# Patient Record
Sex: Male | Born: 1950 | Race: Black or African American | Hispanic: No | Marital: Married | Smoking: Former smoker
Health system: Southern US, Community
[De-identification: ages and names within clinical notes are randomized; demographics above are authoritative.]

## PROBLEM LIST (undated history)

## (undated) DIAGNOSIS — I2510 Atherosclerotic heart disease of native coronary artery without angina pectoris: Secondary | ICD-10-CM

## (undated) DIAGNOSIS — N200 Calculus of kidney: Secondary | ICD-10-CM

## (undated) DIAGNOSIS — R0683 Snoring: Secondary | ICD-10-CM

## (undated) DIAGNOSIS — M5430 Sciatica, unspecified side: Secondary | ICD-10-CM

## (undated) DIAGNOSIS — Z993 Dependence on wheelchair: Secondary | ICD-10-CM

## (undated) DIAGNOSIS — K219 Gastro-esophageal reflux disease without esophagitis: Secondary | ICD-10-CM

## (undated) DIAGNOSIS — Z9989 Dependence on other enabling machines and devices: Secondary | ICD-10-CM

## (undated) DIAGNOSIS — R079 Chest pain, unspecified: Secondary | ICD-10-CM

## (undated) DIAGNOSIS — E785 Hyperlipidemia, unspecified: Secondary | ICD-10-CM

## (undated) DIAGNOSIS — M5126 Other intervertebral disc displacement, lumbar region: Secondary | ICD-10-CM

## (undated) DIAGNOSIS — G4730 Sleep apnea, unspecified: Secondary | ICD-10-CM

## (undated) DIAGNOSIS — G8929 Other chronic pain: Secondary | ICD-10-CM

## (undated) DIAGNOSIS — Z992 Dependence on renal dialysis: Secondary | ICD-10-CM

## (undated) DIAGNOSIS — N186 End stage renal disease: Secondary | ICD-10-CM

## (undated) DIAGNOSIS — R0600 Dyspnea, unspecified: Secondary | ICD-10-CM

## (undated) DIAGNOSIS — Z8719 Personal history of other diseases of the digestive system: Secondary | ICD-10-CM

## (undated) DIAGNOSIS — I10 Essential (primary) hypertension: Secondary | ICD-10-CM

## (undated) DIAGNOSIS — I050 Rheumatic mitral stenosis: Secondary | ICD-10-CM

## (undated) DIAGNOSIS — D638 Anemia in other chronic diseases classified elsewhere: Secondary | ICD-10-CM

## (undated) DIAGNOSIS — I6529 Occlusion and stenosis of unspecified carotid artery: Secondary | ICD-10-CM

## (undated) DIAGNOSIS — R350 Frequency of micturition: Secondary | ICD-10-CM

## (undated) DIAGNOSIS — E119 Type 2 diabetes mellitus without complications: Secondary | ICD-10-CM

## (undated) DIAGNOSIS — J189 Pneumonia, unspecified organism: Secondary | ICD-10-CM

## (undated) DIAGNOSIS — I739 Peripheral vascular disease, unspecified: Secondary | ICD-10-CM

## (undated) DIAGNOSIS — K5900 Constipation, unspecified: Secondary | ICD-10-CM

## (undated) DIAGNOSIS — M1990 Unspecified osteoarthritis, unspecified site: Secondary | ICD-10-CM

## (undated) DIAGNOSIS — G2581 Restless legs syndrome: Secondary | ICD-10-CM

## (undated) DIAGNOSIS — Z87442 Personal history of urinary calculi: Secondary | ICD-10-CM

## (undated) HISTORY — PX: COLONOSCOPY W/ BIOPSIES AND POLYPECTOMY: SHX1376

## (undated) HISTORY — PX: CARDIAC CATHETERIZATION: SHX172

## (undated) HISTORY — PX: FRACTURE SURGERY: SHX138

## (undated) HISTORY — PX: TONSILLECTOMY AND ADENOIDECTOMY: SUR1326

## (undated) HISTORY — DX: Peripheral vascular disease, unspecified: I73.9

## (undated) HISTORY — DX: Hyperlipidemia, unspecified: E78.5

## (undated) HISTORY — PX: INGUINAL HERNIA REPAIR: SUR1180

## (undated) HISTORY — DX: Occlusion and stenosis of unspecified carotid artery: I65.29

## (undated) HISTORY — PX: WISDOM TOOTH EXTRACTION: SHX21

## (undated) HISTORY — DX: Essential (primary) hypertension: I10

## (undated) HISTORY — DX: Atherosclerotic heart disease of native coronary artery without angina pectoris: I25.10

## (undated) HISTORY — PX: FOOT FRACTURE SURGERY: SHX645

---

## 1997-05-18 ENCOUNTER — Ambulatory Visit (HOSPITAL_COMMUNITY): Admission: RE | Admit: 1997-05-18 | Discharge: 1997-05-18 | Payer: Self-pay | Admitting: *Deleted

## 1997-08-04 ENCOUNTER — Encounter: Admission: RE | Admit: 1997-08-04 | Discharge: 1997-11-02 | Payer: Self-pay | Admitting: *Deleted

## 1997-12-04 ENCOUNTER — Emergency Department (HOSPITAL_COMMUNITY): Admission: EM | Admit: 1997-12-04 | Discharge: 1997-12-04 | Payer: Self-pay | Admitting: Emergency Medicine

## 1998-01-13 ENCOUNTER — Encounter: Payer: Self-pay | Admitting: Emergency Medicine

## 1998-01-13 ENCOUNTER — Emergency Department (HOSPITAL_COMMUNITY): Admission: EM | Admit: 1998-01-13 | Discharge: 1998-01-13 | Payer: Self-pay | Admitting: Emergency Medicine

## 1998-01-13 ENCOUNTER — Encounter: Admission: RE | Admit: 1998-01-13 | Discharge: 1998-01-13 | Payer: Self-pay | Admitting: *Deleted

## 1998-01-15 ENCOUNTER — Ambulatory Visit (HOSPITAL_COMMUNITY): Admission: RE | Admit: 1998-01-15 | Discharge: 1998-01-15 | Payer: Self-pay | Admitting: *Deleted

## 1999-04-29 ENCOUNTER — Ambulatory Visit (HOSPITAL_COMMUNITY): Admission: RE | Admit: 1999-04-29 | Discharge: 1999-04-29 | Payer: Self-pay | Admitting: *Deleted

## 1999-09-19 ENCOUNTER — Inpatient Hospital Stay (HOSPITAL_COMMUNITY): Admission: EM | Admit: 1999-09-19 | Discharge: 1999-09-20 | Payer: Self-pay | Admitting: Emergency Medicine

## 1999-09-19 ENCOUNTER — Encounter: Payer: Self-pay | Admitting: Emergency Medicine

## 1999-11-24 ENCOUNTER — Encounter: Payer: Self-pay | Admitting: Internal Medicine

## 1999-11-24 ENCOUNTER — Ambulatory Visit (HOSPITAL_COMMUNITY): Admission: RE | Admit: 1999-11-24 | Discharge: 1999-11-24 | Payer: Self-pay | Admitting: Internal Medicine

## 2000-01-16 ENCOUNTER — Ambulatory Visit (HOSPITAL_COMMUNITY): Admission: RE | Admit: 2000-01-16 | Discharge: 2000-01-16 | Payer: Self-pay | Admitting: General Surgery

## 2000-01-20 ENCOUNTER — Encounter: Payer: Self-pay | Admitting: General Surgery

## 2000-01-20 ENCOUNTER — Encounter: Admission: RE | Admit: 2000-01-20 | Discharge: 2000-01-20 | Payer: Self-pay | Admitting: General Surgery

## 2000-01-23 ENCOUNTER — Ambulatory Visit (HOSPITAL_BASED_OUTPATIENT_CLINIC_OR_DEPARTMENT_OTHER): Admission: RE | Admit: 2000-01-23 | Discharge: 2000-01-23 | Payer: Self-pay | Admitting: General Surgery

## 2000-02-02 ENCOUNTER — Encounter: Payer: Self-pay | Admitting: General Surgery

## 2000-02-02 ENCOUNTER — Ambulatory Visit (HOSPITAL_COMMUNITY): Admission: RE | Admit: 2000-02-02 | Discharge: 2000-02-02 | Payer: Self-pay | Admitting: General Surgery

## 2000-10-04 ENCOUNTER — Ambulatory Visit (HOSPITAL_COMMUNITY): Admission: RE | Admit: 2000-10-04 | Discharge: 2000-10-04 | Payer: Self-pay | Admitting: Internal Medicine

## 2000-10-04 ENCOUNTER — Encounter: Payer: Self-pay | Admitting: Internal Medicine

## 2001-03-15 ENCOUNTER — Encounter: Admission: RE | Admit: 2001-03-15 | Discharge: 2001-03-15 | Payer: Self-pay | Admitting: Otolaryngology

## 2001-03-15 ENCOUNTER — Encounter: Payer: Self-pay | Admitting: Otolaryngology

## 2002-02-14 ENCOUNTER — Encounter: Admission: RE | Admit: 2002-02-14 | Discharge: 2002-02-14 | Payer: Self-pay | Admitting: Occupational Medicine

## 2002-02-14 ENCOUNTER — Encounter: Payer: Self-pay | Admitting: Occupational Medicine

## 2002-05-31 ENCOUNTER — Emergency Department (HOSPITAL_COMMUNITY): Admission: EM | Admit: 2002-05-31 | Discharge: 2002-05-31 | Payer: Self-pay

## 2002-07-24 ENCOUNTER — Ambulatory Visit: Admission: RE | Admit: 2002-07-24 | Discharge: 2002-07-24 | Payer: Self-pay | Admitting: Internal Medicine

## 2003-01-08 ENCOUNTER — Encounter: Payer: Self-pay | Admitting: Emergency Medicine

## 2003-01-08 ENCOUNTER — Emergency Department (HOSPITAL_COMMUNITY): Admission: EM | Admit: 2003-01-08 | Discharge: 2003-01-08 | Payer: Self-pay | Admitting: Emergency Medicine

## 2003-06-01 ENCOUNTER — Ambulatory Visit (HOSPITAL_COMMUNITY): Admission: RE | Admit: 2003-06-01 | Discharge: 2003-06-01 | Payer: Self-pay | Admitting: Orthopedic Surgery

## 2003-06-01 ENCOUNTER — Ambulatory Visit (HOSPITAL_BASED_OUTPATIENT_CLINIC_OR_DEPARTMENT_OTHER): Admission: RE | Admit: 2003-06-01 | Discharge: 2003-06-01 | Payer: Self-pay | Admitting: Orthopedic Surgery

## 2003-06-04 ENCOUNTER — Ambulatory Visit (HOSPITAL_COMMUNITY): Admission: RE | Admit: 2003-06-04 | Discharge: 2003-06-04 | Payer: Self-pay | Admitting: Orthopedic Surgery

## 2003-08-18 ENCOUNTER — Encounter: Admission: RE | Admit: 2003-08-18 | Discharge: 2003-08-18 | Payer: Self-pay | Admitting: Nephrology

## 2005-07-03 ENCOUNTER — Emergency Department (HOSPITAL_COMMUNITY): Admission: EM | Admit: 2005-07-03 | Discharge: 2005-07-03 | Payer: Self-pay | Admitting: Emergency Medicine

## 2008-02-16 ENCOUNTER — Emergency Department (HOSPITAL_COMMUNITY): Admission: EM | Admit: 2008-02-16 | Discharge: 2008-02-16 | Payer: Self-pay | Admitting: Emergency Medicine

## 2008-05-05 ENCOUNTER — Encounter: Admission: RE | Admit: 2008-05-05 | Discharge: 2008-05-05 | Payer: Self-pay | Admitting: Podiatry

## 2008-06-22 ENCOUNTER — Encounter: Payer: Self-pay | Admitting: Cardiology

## 2008-06-22 ENCOUNTER — Ambulatory Visit: Payer: Self-pay | Admitting: Internal Medicine

## 2008-06-22 ENCOUNTER — Inpatient Hospital Stay (HOSPITAL_COMMUNITY): Admission: EM | Admit: 2008-06-22 | Discharge: 2008-06-24 | Payer: Self-pay | Admitting: Emergency Medicine

## 2008-06-23 ENCOUNTER — Encounter (INDEPENDENT_AMBULATORY_CARE_PROVIDER_SITE_OTHER): Payer: Self-pay | Admitting: Internal Medicine

## 2008-09-02 ENCOUNTER — Encounter: Admission: RE | Admit: 2008-09-02 | Discharge: 2008-10-07 | Payer: Self-pay | Admitting: Podiatry

## 2008-10-08 ENCOUNTER — Encounter: Admission: RE | Admit: 2008-10-08 | Discharge: 2009-01-06 | Payer: Self-pay | Admitting: Podiatry

## 2009-05-06 ENCOUNTER — Encounter: Admission: RE | Admit: 2009-05-06 | Discharge: 2009-05-06 | Payer: Self-pay | Admitting: Internal Medicine

## 2009-07-20 ENCOUNTER — Encounter: Payer: Self-pay | Admitting: Cardiology

## 2009-07-22 DIAGNOSIS — E119 Type 2 diabetes mellitus without complications: Secondary | ICD-10-CM | POA: Insufficient documentation

## 2009-07-22 DIAGNOSIS — I10 Essential (primary) hypertension: Secondary | ICD-10-CM | POA: Insufficient documentation

## 2009-07-22 DIAGNOSIS — E785 Hyperlipidemia, unspecified: Secondary | ICD-10-CM | POA: Insufficient documentation

## 2009-07-26 ENCOUNTER — Ambulatory Visit: Payer: Self-pay | Admitting: Cardiology

## 2009-07-27 ENCOUNTER — Telehealth (INDEPENDENT_AMBULATORY_CARE_PROVIDER_SITE_OTHER): Payer: Self-pay | Admitting: *Deleted

## 2009-07-28 ENCOUNTER — Encounter (HOSPITAL_COMMUNITY): Admission: RE | Admit: 2009-07-28 | Discharge: 2009-10-05 | Payer: Self-pay | Admitting: Cardiology

## 2009-07-28 ENCOUNTER — Ambulatory Visit: Payer: Self-pay

## 2009-07-28 ENCOUNTER — Ambulatory Visit: Payer: Self-pay | Admitting: Cardiology

## 2009-07-28 ENCOUNTER — Encounter: Payer: Self-pay | Admitting: Cardiology

## 2009-08-04 ENCOUNTER — Telehealth: Payer: Self-pay | Admitting: Cardiology

## 2009-12-01 ENCOUNTER — Ambulatory Visit: Payer: Self-pay | Admitting: Cardiovascular Disease

## 2009-12-01 ENCOUNTER — Observation Stay (HOSPITAL_COMMUNITY): Admission: EM | Admit: 2009-12-01 | Discharge: 2009-12-02 | Payer: Self-pay | Admitting: Emergency Medicine

## 2010-05-10 NOTE — Progress Notes (Signed)
Summary: stress test   Phone Note Call from Patient Call back at Home Phone [phone] Call back at [phone]   Caller: Patient Reason for Call: Lab or Test Results Summary of Call: request results of stress test Initial call taken by: Darnell Level,  August 04, 2009 9:18 AM  Follow-up for Phone Call        Spoke with pt. Stress test results and MD's recommendation given. Pt. verbalized understanding. Follow-up by: Carollee Sires, RN, BSN,  August 04, 2009 10:00 AM

## 2010-05-10 NOTE — Assessment & Plan Note (Signed)
Summary: np6/abn ekg/chest pain   Visit Type:  Follow-up Primary Provider:  Dr. [name]  CC:  chest pain.  History of Present Illness: The patient presents for evaluation of chest pain and known CAD.  He was hospitalized last year with chest pain and hypertensive urgency. He did have an abnormal EKG as described below. He subsequently underwent cardiac catheterization and was found to have LAD 25% stenosis ostial, 25% mid and 30% mid stenosis. A small diagonal had ostial 80% stenosis. The circumflex had 25% scattered lesions. The right coronary artery also had 25% stenosis. He was managed medically. However, he is now getting chest discomfort. This happens sporadically. He may get pain for 3 or 4 days ago. It might stop for a couple of days. He seems to notice this more with emotional stress such as when he gets upset at work. He is not noticing it as much with activity. He describes a left-sided aching. It might go away after 10-15 seconds. He does not describe neck or arm discomfort. He does not describe nausea vomiting or associated symptoms. He does not describe shortness of breath. He has not taken nitroglycerin.  Current Medications (verified): 1)  Clonidine Hcl 0.1 Mg Tabs (Clonidine Hcl) .Marland Kitchen.. 1 By Mouth Two Times A Day 2)  Furosemide 40 Mg Tabs (Furosemide) .Marland Kitchen.. 1 By Mouth Daily 3)  Twynsta 80-10 Mg Tabs (Telmisartan-Amlodipine) .Marland Kitchen.. 1 By Mouth Daily 4)  Tekturna 300 Mg Tabs (Aliskiren Fumarate) .Marland Kitchen.. 1 By Mouth Daily 5)  Metformin Hcl 1000 Mg Tabs (Metformin Hcl) .Marland Kitchen.. 1 By Mouth Two Times A Day 6)  Lipitor 80 Mg Tabs (Atorvastatin Calcium) .Marland Kitchen.. 1 By Mouth Daily 7)  Coreg Cr 80 Mg Xr24h-Cap (Carvedilol Phosphate) .Marland Kitchen.. 1 By Mouth Daily  Allergies (verified): No Known Drug Allergies  Past History:  Past Medical History:  1. Insulin-dependent diabetes mellitus.   2. Hyperlipidemia.   3. Hypertension.   4. Kidney stones.   5. CAD (see above)     Family History: Father is  alive and well.  Mother has hypertension and  premature coronary artery disease.  One sister died of massive MI in early 40s/ 69s.  Another sister had CAD in her 41s.  One brother had diabetes mellitus.   Social History: The patient is a Retail buyer.  He is married and has 2  children.  Denies tobacco, alcohol or IV drug abuse or marijuana or cocaine intake.   Review of Systems       As stated in the HPI and negative for all other systems.   Vital Signs:  Patient profile:   60 year old male Height:      71 inches Weight:      192 pounds BMI:     26.88 Pulse rate:   78 / minute Resp:     18 per minute BP sitting:   144 / 95  (right arm)  Vitals Entered By: Levora Angel, CNA (July 26, 2009 10:40 AM)  Physical Exam  General:  Well developed, well nourished, in no acute distress. Head:  normocephalic and atraumatic Eyes:  PERRLA/EOM intact; conjunctiva and lids normal. Mouth:  Dentures. Oral mucosa normal. Neck:  Neck supple, no JVD. No masses, thyromegaly or abnormal cervical nodes. Chest Wall:  no deformities or breast masses noted Lungs:  Clear bilaterally to auscultation and percussion. Abdomen:  Bowel sounds positive; abdomen soft and non-tender without masses, organomegaly, or hernias noted. No hepatosplenomegaly, insulin, Msk:  Back normal, normal gait. Muscle strength and  tone normal. Extremities:  No clubbing or cyanosis, absent tip of his left index finger secondary to trauma as a child Neurologic:  Alert and oriented x 3. Skin:  Intact without lesions or rashes. Cervical Nodes:  no significant adenopathy Axillary Nodes:  no significant adenopathy Inguinal Nodes:  no significant adenopathy Psych:  Normal affect.   Detailed Cardiovascular Exam  Neck    Carotids: Carotids full and equal bilaterally without bruits.      Neck Veins: Normal, no JVD.    Heart    Inspection: no deformities or lifts noted.      Palpation: normal PMI with no thrills palpable.       Auscultation: regular rate and rhythm, S1, S2 without murmurs, rubs, gallops, or clicks.    Vascular    Abdominal Aorta: no palpable masses, pulsations, or audible bruits.      Femoral Pulses: normal femoral pulses bilaterally.      Pedal Pulses: normal pedal pulses bilaterally.      Radial Pulses: normal radial pulses bilaterally.      Peripheral Circulation: no clubbing, cyanosis, or edema noted with normal capillary refill.     EKG  Procedure date:  06/22/2009  Findings:      sinus rhythm, rate 64, axis within normal limits, intervals within normal limits, poor anterior R-wave progression, no change from previous  Impression & Recommendations:  Problem # 1:  CHEST PAIN, PRECORDIAL (ICD-786.51) The patient has chest pain as described. He has known coronary disease with a high-grade obstructive lesion. The issue is the extent of ischemia that this might cause. We need to understand whether he would have continued medical management or other therapy. Therefore, stress perfusion imaging is indicated. I did not want to hold his medications because of his significant blood pressure. He should have a pharmacologic perfusion study. Orders: Nuclear Stress Test (Nuc Stress Test)  Problem # 2:  HYPERTENSION (ICD-401.9) His blood pressure has been very difficult to control but it looks like it's improved. He will continue the meds as listed.  Problem # 3:  HYPERLIPIDEMIA (ICD-272.4) Of note his LDL was recently 147. However, he was told a few weeks ago to hold his Lipitor because of muscle aches. These have actually improved. He will need a statin but I will defer to his primary physician. Judging by the degree of dyslipidemia and his risk factors perhaps Crestor would be the most appropriate choice.  Patient Instructions: 1)  Your physician recommends that you schedule a follow-up appointment in: 1 year with Dr Percival Spanish 2)  Your physician recommends that you continue on your current  medications as directed. Please refer to the Current Medication list given to you today. 3)  Your physician has requested that you have an adenosine myoview.  For further information please visit [email or web address].  Please follow instruction sheet, as given.

## 2010-05-10 NOTE — Assessment & Plan Note (Signed)
Summary: Cardiology Nuclear Study  Nuclear Med Background Indications for Stress Test: Evaluation for Ischemia   History: Abnormal EKG, Echo, Heart Catheterization  History Comments: 3/10 Echo: EF= 60% 3/10 Heart Cath: Tx med, sm. Dx 80%, residual N/O CAD (LAD, CFX, RCA)  Symptoms: Chest Pain, DOE, Palpitations, SOB    Nuclear Pre-Procedure Cardiac Risk Factors: Family History - CAD, Hypertension, Lipids Caffeine/Decaff Intake: None NPO After: 11:30 PM Lungs: clear IV 0.9% NS with Angio Cath: 20g     IV Site: (R) AC IV Started by: Irven Baltimore RN Chest Size (in) 42     Height (in): 71 Weight (lb): 190 BMI: 26.60 Tech Comments: Last dose coreg 9 PM yesterday, baseline BP 170/80 at 7:30 AM.  [place].  Nuclear Med Study 1 or 2 day study:  1 day     Stress Test Type:  Carlton Adam Reading MD:  [name], MD     Referring MD:  J.Hochrein Resting Radionuclide:  Technetium 70m Tetrofosmin     Resting Radionuclide Dose:  10.7 mCi  Stress Radionuclide:  Technetium 43m Tetrofosmin     Stress Radionuclide Dose:  33 mCi   Stress Protocol   Lexiscan: 0.4 mg   Stress Test Technologist:  Perrin Maltese EMT-P     Nuclear Technologist:  Charlton Amor CNMT  Rest Procedure  Myocardial perfusion imaging was performed at rest 45 minutes following the intravenous administration of Myoview Technetium 55m Tetrofosmin.  Stress Procedure  The patient received IV Lexiscan 0.4 mg over 15-seconds.  Myoview injected at 30-seconds.  There were no significant changes, + sob, ringing in his ears, nausea, and rare pvcs with infusion.  Quantitative spect images were obtained after a 45 minute delay.  QPS Raw Data Images:  There is no interference from other nuclear activity. Stress Images:  There is decreased uptake in the apex. Rest Images:  There is decreased uptake in the apex. Subtraction (SDS):  No evidence of ischemia. Transient Ischemic Dilatation:  1.04  (Normal <1.22)  Lung/Heart Ratio:  .29  (Normal <0.45)  Quantitative Gated Spect Images QGS EDV:  119 ml QGS ESV:  56 ml QGS EF:  53 % QGS cine images:  Normal wall motion; mild left ventricular enlargement.   Overall Impression  Exercise Capacity: [place] study with no exercise. BP Response: Normal blood pressure response. Clinical Symptoms: No chest pain ECG Impression: No significant ST segment change suggestive of ischemia. Overall Impression: There is apical thinning but no sign of scar or ischemia.  Appended Document: Cardiology Nuclear Study No evidence of ischemia infarct.  Continue medical management.  Appended Document: Cardiology Nuclear Study Pt. aware of stress test results and MD's recommendations.

## 2010-05-10 NOTE — Letter (Signed)
Summary: Dr [name] Nasuti Office Note  Dr [name] Nasuti Office Note   Imported By: Sallee Provencal 08/18/2009 11:35:29  _____________________________________________________________________  External Attachment:    Type:   Image     Comment:   External Document

## 2010-05-10 NOTE — Consult Note (Signed)
Summary: Dr [name] Nasuti Office Referral Form  Dr [name] Nasuti Office Referral Form   Imported By: Sallee Provencal 08/04/2009 13:58:10  _____________________________________________________________________  External Attachment:    Type:   Image     Comment:   External Document

## 2010-05-10 NOTE — Progress Notes (Signed)
Summary: Nuclear Pre-Procedure  Phone Note Outgoing Call Call back at [facility] Phone [phone]   Call placed by: Eliezer Lofts, EMT-P,  July 27, 2009 2:18 PM Action Taken: Phone Call Completed Summary of Call: Left message with information on Myoview Information Sheet (see scanned document for details).     Nuclear Med Background Indications for Stress Test: Evaluation for Ischemia   History: Abnormal EKG, Echo, Heart Catheterization  History Comments: 3/10 Echo: EF= 60% 3/10 Heart Cath: Tx med, sm. Dx 80%, residual N/O CAD (LAD, CFX, RCA)  Symptoms: Chest Pain    Nuclear Pre-Procedure Cardiac Risk Factors: Family History - CAD, Hypertension, Lipids Height (in): 71

## 2010-06-23 LAB — CBC
HCT: 39.3 % (ref 39.0–52.0)
Hemoglobin: 13.1 g/dL (ref 13.0–17.0)
MCH: 29 pg (ref 26.0–34.0)
MCHC: 33.3 g/dL (ref 30.0–36.0)
MCV: 87.1 fL (ref 78.0–100.0)
Platelets: 207 10*3/uL (ref 150–400)
RBC: 4.51 MIL/uL (ref 4.22–5.81)
RDW: 13.2 % (ref 11.5–15.5)
WBC: 7.2 10*3/uL (ref 4.0–10.5)

## 2010-06-23 LAB — GLUCOSE, CAPILLARY
Glucose-Capillary: 115 mg/dL — ABNORMAL HIGH (ref 70–99)
Glucose-Capillary: 140 mg/dL — ABNORMAL HIGH (ref 70–99)
Glucose-Capillary: 175 mg/dL — ABNORMAL HIGH (ref 70–99)
Glucose-Capillary: 206 mg/dL — ABNORMAL HIGH (ref 70–99)

## 2010-06-23 LAB — CARDIAC PANEL(CRET KIN+CKTOT+MB+TROPI)
CK, MB: 3.5 ng/mL (ref 0.3–4.0)
CK, MB: 4.1 ng/mL — ABNORMAL HIGH (ref 0.3–4.0)
Relative Index: 1.2 (ref 0.0–2.5)
Relative Index: 1.2 (ref 0.0–2.5)
Total CK: 303 U/L — ABNORMAL HIGH (ref 7–232)
Total CK: 332 U/L — ABNORMAL HIGH (ref 7–232)
Troponin I: 0.02 ng/mL (ref 0.00–0.06)
Troponin I: 0.04 ng/mL (ref 0.00–0.06)

## 2010-06-23 LAB — CK TOTAL AND CKMB (NOT AT ARMC)
CK, MB: 5.3 ng/mL — ABNORMAL HIGH (ref 0.3–4.0)
Relative Index: 1.3 (ref 0.0–2.5)
Total CK: 421 U/L — ABNORMAL HIGH (ref 7–232)

## 2010-06-23 LAB — DIFFERENTIAL
Basophils Absolute: 0 10*3/uL (ref 0.0–0.1)
Basophils Relative: 0 % (ref 0–1)
Eosinophils Absolute: 0.1 10*3/uL (ref 0.0–0.7)
Eosinophils Relative: 2 % (ref 0–5)
Lymphocytes Relative: 38 % (ref 12–46)
Lymphs Abs: 2.7 10*3/uL (ref 0.7–4.0)
Monocytes Absolute: 0.5 10*3/uL (ref 0.1–1.0)
Monocytes Relative: 7 % (ref 3–12)
Neutro Abs: 3.8 10*3/uL (ref 1.7–7.7)
Neutrophils Relative %: 53 % (ref 43–77)

## 2010-06-23 LAB — POCT I-STAT, CHEM 8
BUN: 25 mg/dL — ABNORMAL HIGH (ref 6–23)
Calcium, Ion: 1.07 mmol/L — ABNORMAL LOW (ref 1.12–1.32)
Chloride: 106 mEq/L (ref 96–112)
Creatinine, Ser: 1.9 mg/dL — ABNORMAL HIGH (ref 0.4–1.5)
Glucose, Bld: 153 mg/dL — ABNORMAL HIGH (ref 70–99)
HCT: 41 % (ref 39.0–52.0)
Hemoglobin: 13.9 g/dL (ref 13.0–17.0)
Potassium: 3.6 mEq/L (ref 3.5–5.1)
Sodium: 140 mEq/L (ref 135–145)
TCO2: 26 mmol/L (ref 0–100)

## 2010-06-23 LAB — POCT CARDIAC MARKERS
CKMB, poc: 2.1 ng/mL (ref 1.0–8.0)
Myoglobin, poc: 149 ng/mL (ref 12–200)
Troponin i, poc: <0.05 ng/mL (ref 0.00–0.09)

## 2010-06-23 LAB — D-DIMER, QUANTITATIVE: D-Dimer, Quant: 0.27 ug/mL-FEU (ref 0.00–0.48)

## 2010-06-23 LAB — TROPONIN I: Troponin I: 0.06 ng/mL (ref 0.00–0.06)

## 2010-07-15 ENCOUNTER — Other Ambulatory Visit: Payer: Self-pay | Admitting: *Deleted

## 2010-07-15 MED ORDER — CLONIDINE HCL 0.2 MG PO TABS: 0.2000 mg | ORAL_TABLET | Freq: Two times a day (BID) | ORAL | Status: DC

## 2010-07-20 ENCOUNTER — Other Ambulatory Visit: Payer: Self-pay | Admitting: *Deleted

## 2010-07-20 MED ORDER — CARVEDILOL 12.5 MG PO TABS: 12.5000 mg | ORAL_TABLET | Freq: Two times a day (BID) | ORAL | Status: DC

## 2010-07-21 LAB — BASIC METABOLIC PANEL
BUN: 16 mg/dL (ref 6–23)
BUN: 21 mg/dL (ref 6–23)
CO2: 29 mEq/L (ref 19–32)
CO2: 29 mEq/L (ref 19–32)
Calcium: 8.6 mg/dL (ref 8.4–10.5)
Calcium: 9.2 mg/dL (ref 8.4–10.5)
Chloride: 106 mEq/L (ref 96–112)
Chloride: 99 mEq/L (ref 96–112)
Creatinine, Ser: 1.43 mg/dL (ref 0.4–1.5)
Creatinine, Ser: 1.57 mg/dL — ABNORMAL HIGH (ref 0.4–1.5)
GFR calc Af Amer: >60 mL/min (ref >60)
GFR calc Af Amer: 55 mL/min — ABNORMAL LOW (ref >60)
GFR calc non Af Amer: 51 mL/min — ABNORMAL LOW (ref >60)
GFR calc non Af Amer: 46 mL/min — ABNORMAL LOW (ref >60)
Glucose, Bld: 108 mg/dL — ABNORMAL HIGH (ref 70–99)
Glucose, Bld: 180 mg/dL — ABNORMAL HIGH (ref 70–99)
Potassium: 3.6 mEq/L (ref 3.5–5.1)
Potassium: 3.8 mEq/L (ref 3.5–5.1)
Sodium: 138 mEq/L (ref 135–145)
Sodium: 136 mEq/L (ref 135–145)

## 2010-07-21 LAB — CBC
HCT: 37.5 % — ABNORMAL LOW (ref 39.0–52.0)
HCT: 38.8 % — ABNORMAL LOW (ref 39.0–52.0)
HCT: 40.9 % (ref 39.0–52.0)
Hemoglobin: 12.9 g/dL — ABNORMAL LOW (ref 13.0–17.0)
Hemoglobin: 13.1 g/dL (ref 13.0–17.0)
Hemoglobin: 13.8 g/dL (ref 13.0–17.0)
MCHC: 33.8 g/dL (ref 30.0–36.0)
MCHC: 34.4 g/dL (ref 30.0–36.0)
MCHC: 33.7 g/dL (ref 30.0–36.0)
MCV: 87 fL (ref 78.0–100.0)
MCV: 88.6 fL (ref 78.0–100.0)
MCV: 88.4 fL (ref 78.0–100.0)
Platelets: 183 10*3/uL (ref 150–400)
Platelets: 223 10*3/uL (ref 150–400)
Platelets: 201 10*3/uL (ref 150–400)
RBC: 4.31 MIL/uL (ref 4.22–5.81)
RBC: 4.38 MIL/uL (ref 4.22–5.81)
RBC: 4.63 MIL/uL (ref 4.22–5.81)
RDW: 13.5 % (ref 11.5–15.5)
RDW: 13.7 % (ref 11.5–15.5)
RDW: 13.5 % (ref 11.5–15.5)
WBC: 7.3 10*3/uL (ref 4.0–10.5)
WBC: 8.3 10*3/uL (ref 4.0–10.5)
WBC: 6.6 10*3/uL (ref 4.0–10.5)

## 2010-07-21 LAB — CARDIAC PANEL(CRET KIN+CKTOT+MB+TROPI)
CK, MB: 2.6 ng/mL (ref 0.3–4.0)
CK, MB: 3.5 ng/mL (ref 0.3–4.0)
CK, MB: 3.5 ng/mL (ref 0.3–4.0)
Relative Index: 1.7 (ref 0.0–2.5)
Relative Index: 1.8 (ref 0.0–2.5)
Relative Index: 1.8 (ref 0.0–2.5)
Total CK: 154 U/L (ref 7–232)
Total CK: 192 U/L (ref 7–232)
Total CK: 198 U/L (ref 7–232)
Troponin I: 0.03 ng/mL (ref 0.00–0.06)
Troponin I: 0.03 ng/mL (ref 0.00–0.06)
Troponin I: 0.01 ng/mL (ref 0.00–0.06)

## 2010-07-21 LAB — GLUCOSE, CAPILLARY
Glucose-Capillary: 140 mg/dL — ABNORMAL HIGH (ref 70–99)
Glucose-Capillary: 150 mg/dL — ABNORMAL HIGH (ref 70–99)
Glucose-Capillary: 159 mg/dL — ABNORMAL HIGH (ref 70–99)
Glucose-Capillary: 160 mg/dL — ABNORMAL HIGH (ref 70–99)
Glucose-Capillary: 161 mg/dL — ABNORMAL HIGH (ref 70–99)
Glucose-Capillary: 207 mg/dL — ABNORMAL HIGH (ref 70–99)
Glucose-Capillary: 207 mg/dL — ABNORMAL HIGH (ref 70–99)
Glucose-Capillary: 210 mg/dL — ABNORMAL HIGH (ref 70–99)
Glucose-Capillary: 101 mg/dL — ABNORMAL HIGH (ref 70–99)
Glucose-Capillary: 237 mg/dL — ABNORMAL HIGH (ref 70–99)

## 2010-07-21 LAB — COMPREHENSIVE METABOLIC PANEL
ALT: 18 U/L (ref 0–53)
AST: 19 U/L (ref 0–37)
Albumin: 2.9 g/dL — ABNORMAL LOW (ref 3.5–5.2)
Alkaline Phosphatase: 72 U/L (ref 39–117)
BUN: 23 mg/dL (ref 6–23)
CO2: 31 mEq/L (ref 19–32)
Calcium: 8.8 mg/dL (ref 8.4–10.5)
Chloride: 99 mEq/L (ref 96–112)
Creatinine, Ser: 1.6 mg/dL — ABNORMAL HIGH (ref 0.4–1.5)
GFR calc Af Amer: 54 mL/min — ABNORMAL LOW (ref >60)
GFR calc non Af Amer: 45 mL/min — ABNORMAL LOW (ref >60)
Glucose, Bld: 220 mg/dL — ABNORMAL HIGH (ref 70–99)
Potassium: 3.2 mEq/L — ABNORMAL LOW (ref 3.5–5.1)
Sodium: 136 mEq/L (ref 135–145)
Total Bilirubin: 0.8 mg/dL (ref 0.3–1.2)
Total Protein: 5.7 g/dL — ABNORMAL LOW (ref 6.0–8.3)

## 2010-07-21 LAB — LIPID PANEL
Cholesterol: 132 mg/dL (ref 0–200)
HDL: 28 mg/dL — ABNORMAL LOW (ref >39)
LDL Cholesterol: 59 mg/dL (ref 0–99)
Total CHOL/HDL Ratio: 4.7 RATIO
Triglycerides: 227 mg/dL — ABNORMAL HIGH (ref <150)
VLDL: 45 mg/dL — ABNORMAL HIGH (ref 0–40)

## 2010-07-21 LAB — HEMOGLOBIN A1C
Hgb A1c MFr Bld: 9.3 % — ABNORMAL HIGH (ref 4.6–6.1)
Mean Plasma Glucose: 220 mg/dL

## 2010-07-21 LAB — POCT CARDIAC MARKERS
CKMB, poc: 1.9 ng/mL (ref 1.0–8.0)
Myoglobin, poc: 134 ng/mL (ref 12–200)
Troponin i, poc: <0.05 ng/mL (ref 0.00–0.09)

## 2010-07-21 LAB — PROTIME-INR
INR: 1 (ref 0.00–1.49)
Prothrombin Time: 13.2 seconds (ref 11.6–15.2)

## 2010-07-21 LAB — APTT: aPTT: 33 seconds (ref 24–37)

## 2010-07-23 ENCOUNTER — Encounter: Payer: Self-pay | Admitting: Physician Assistant

## 2010-07-26 ENCOUNTER — Encounter: Payer: Self-pay | Admitting: Physician Assistant

## 2010-07-26 ENCOUNTER — Ambulatory Visit (INDEPENDENT_AMBULATORY_CARE_PROVIDER_SITE_OTHER): Payer: BC Managed Care – PPO | Admitting: Physician Assistant

## 2010-07-26 VITALS — BP 200/110 | HR 70 | Ht 66.0 in | Wt 192.0 lb

## 2010-07-26 DIAGNOSIS — I2510 Atherosclerotic heart disease of native coronary artery without angina pectoris: Secondary | ICD-10-CM | POA: Insufficient documentation

## 2010-07-26 DIAGNOSIS — N184 Chronic kidney disease, stage 4 (severe): Secondary | ICD-10-CM | POA: Insufficient documentation

## 2010-07-26 DIAGNOSIS — R072 Precordial pain: Secondary | ICD-10-CM

## 2010-07-26 DIAGNOSIS — N189 Chronic kidney disease, unspecified: Secondary | ICD-10-CM

## 2010-07-26 DIAGNOSIS — I10 Essential (primary) hypertension: Secondary | ICD-10-CM

## 2010-07-26 MED ORDER — PRAVASTATIN SODIUM 80 MG PO TABS: 80.0000 mg | ORAL_TABLET | Freq: Every day | ORAL | Status: DC

## 2010-07-26 MED ORDER — CARVEDILOL 12.5 MG PO TABS: 12.5000 mg | ORAL_TABLET | Freq: Two times a day (BID) | ORAL | Status: DC

## 2010-07-26 MED ORDER — AMLODIPINE BESYLATE 10 MG PO TABS: 10.0000 mg | ORAL_TABLET | Freq: Every day | ORAL | Status: DC

## 2010-07-26 MED ORDER — CLONIDINE HCL 0.2 MG PO TABS: ORAL_TABLET | ORAL | Status: DC

## 2010-07-26 MED ORDER — ASPIRIN EC 81 MG PO TBEC: 81.0000 mg | DELAYED_RELEASE_TABLET | Freq: Every day | ORAL | Status: DC

## 2010-07-26 NOTE — Assessment & Plan Note (Signed)
Advised patient to take ASA 81 mg qd.  As noted, get myoview.

## 2010-07-26 NOTE — Assessment & Plan Note (Addendum)
This is uncontrolled.  I had him take his morning medications while in the office today which includes Coreg and clonidine.  I will increase his clonidine to 0.3 mg twice a day.  If he does not have significant improvement in his blood pressure with this, we could go up on his carvedilol to 25 mg twice a day.  He also has symptoms that sound consistent with benign prostatic hypertrophy.  We could consider adding Cardura as well.

## 2010-07-26 NOTE — Patient Instructions (Addendum)
Your physician recommends that you schedule a follow-up appointment in: 2-3 weeks with Dr. Percival Spanish after your Carlton Adam has been performed.  Your physician has requested that you have a lexiscan myoview. For further information please visit [email or web address]. Please follow instruction sheet, as given.   Your physician has recommended you make the following change in your medication: INCREASE CLONIDINE TO 0.3 MG 1 TABLET TWICE DAILY. START ASPIRIN 81 MG 1 TABLET DAILY, THE ASPIRIN YOU CAN PICK UP OVER THE COUNTER.

## 2010-07-26 NOTE — Progress Notes (Signed)
History of Present Illness: Primary Cardiologist:  Dr. Minus Breeding  Jon Anderson is a 60 y.o. male with a h/o CAD (nonobstructive by cath in 3/10 except for an 80% small diagonal which has been treated medically), good LVF, HTN, HLP, DM2 and CKD who is referred back by his PCP for management of HTN and follow up on chest pain.  He has had this same pain off and on for years.  It returned about 3-6 months ago.  He was admitted last September with chest pain.  He r/o for MI and was continued on medical therapy.  His pain is left sided.  He describes it as a pressure.  It can occur with exertion.  However, it is not directly related to exertion.  It can occur at rest as well.  He can exert himself without chest discomfort.  He denies radiating symptoms.  He denies associated nausea or diaphoresis.  He may have some shortness of breath but nothing significant.  He denies syncope or near-syncope.  He denies any orthopnea, PND.  He does have some mild pedal edema without significant change.  He apparently has a hiatal hernia.  He does report some dysphagia.  He denies water brash, hematemesis or increased belching.  Overall, he thinks his chest discomfort may becoming more frequent.  However, this is the same discomfort he has had in the past.  He has not had any medication changes recently.  He has not taken his morning medications yet today.    Past Medical History  Diagnosis Date  . Diabetes mellitus   . Hyperlipidemia   . HTN (hypertension)     echo 3/10: EF 60%, LAE  . Kidney stone   . CAD (coronary artery disease)     a.  cath 3/10: oLAD 25%, mLAD 25%/30%; CFX 25%; oDx 80% (small - tx. medically); pRCA 25%, mRCA 25%; OM3 20%;     b.  Myoview 4/11: EF 53%, no scar or ischemia  . Chronic kidney disease     Current Outpatient Prescriptions  Medication Sig Dispense Refill  . amLODipine (NORVASC) 10 MG tablet Take 10 mg by mouth daily.        . carvedilol (COREG) 12.5 MG tablet Take 1 tablet  (12.5 mg total) by mouth 2 (two) times daily.  30 tablet  11  . cloNIDine (CATAPRES) 0.2 MG tablet Take 1 tablet (0.2 mg total) by mouth 2 (two) times daily.  60 tablet  11  . insulin aspart (NOVOLOG) 100 UNIT/ML injection Inject into the skin as directed.        . metFORMIN (GLUMETZA) 1000 MG (MOD) 24 hr tablet Take 1,000 mg by mouth 2 (two) times daily with a meal.        . pravastatin (PRAVACHOL) 80 MG tablet Take 80 mg by mouth daily.        Marland Kitchen DISCONTD: aliskiren (TEKTURNA) 300 MG tablet Take 300 mg by mouth daily.        Marland Kitchen DISCONTD: atorvastatin (LIPITOR) 80 MG tablet Take 80 mg by mouth daily.        Marland Kitchen DISCONTD: furosemide (LASIX) 40 MG tablet Take 40 mg by mouth daily.        Marland Kitchen DISCONTD: Telmisartan-Amlodipine (TWYNSTA) 80-10 MG TABS 1 tab po qd         No Known Allergies  History  Substance Use Topics  . Smoking status: Former Research scientist (life sciences)  . Smokeless tobacco: Not on file  . Alcohol Use: No  ROS:  Please see history of present illness.  He denies fevers, chills, cough, hemoptysis.  No recent travels.  No melena, hematochezia.  He does report frequent urination and nocturia consistent with benign prostatic hypertrophy.  All other systems reviewed and negative.  Vital Signs: BP 200/110  Pulse 70  Ht 5\' 6"  (1.676 m)  Wt 192 lb (87.091 kg)  BMI 30.99 kg/m2  PHYSICAL EXAM: Well nourished, well developed, in no acute distress HEENT: normal Neck: no JVD Vascular: No carotid bruits Endocrine: No thyromegaly Cardiac:  normal S1, S2; RRR; no murmur Lungs:  clear to auscultation bilaterally, no wheezing, rhonchi or rales Abd: soft, nontender, no hepatomegaly Ext: no edema Skin: warm and dry Neuro:  CNs 2-12 intact, no focal abnormalities noted  EKG:  Normal sinus rhythm, heart rate 70, normal axis, Q waves in V1-V3 with J-point elevation, nonspecific ST-T wave changes, no significant change when compared to prior tracings  ASSESSMENT AND PLAN:

## 2010-07-26 NOTE — Assessment & Plan Note (Addendum)
He is no longer on an ARB or direct renin inhibitor.  His recent creatinine with his PCP was stable at 1.57.  If his BP remains difficult to control or his creatinine goes up, we can consider obtaining renal arterial dopplers to r/o renal artery stenosis.

## 2010-07-26 NOTE — Assessment & Plan Note (Addendum)
He had a small diagonal with 80% stenosis at his cardiac catheterization in 2010.  His symptoms are atypical.  He had a nonischemic Myoview in 2011.  Given his significant cardiac risk factors, I will arrange for him to have a repeat [place] study.  If this is negative for ischemia, I would suggest workup for other causes.  He does have some significant dysphagia and would likely benefit from anti-reflex measures as well as gastroenterology evaluation. Follow up with Dr. Percival Spanish in 2-3 weeks.

## 2010-08-03 ENCOUNTER — Other Ambulatory Visit: Payer: Self-pay | Admitting: *Deleted

## 2010-08-03 MED ORDER — CLONIDINE HCL 0.3 MG PO TABS: 0.3000 mg | ORAL_TABLET | Freq: Two times a day (BID) | ORAL | Status: DC

## 2010-08-03 NOTE — Telephone Encounter (Signed)
See refill

## 2010-08-11 ENCOUNTER — Other Ambulatory Visit (HOSPITAL_COMMUNITY): Payer: BC Managed Care – PPO | Admitting: Radiology

## 2010-08-11 DIAGNOSIS — R0989 Other specified symptoms and signs involving the circulatory and respiratory systems: Secondary | ICD-10-CM

## 2010-08-17 ENCOUNTER — Ambulatory Visit: Payer: BC Managed Care – PPO | Admitting: Cardiology

## 2010-08-22 ENCOUNTER — Ambulatory Visit (HOSPITAL_COMMUNITY): Payer: BC Managed Care – PPO | Attending: Cardiology | Admitting: Radiology

## 2010-08-22 VITALS — Ht 66.0 in | Wt 186.0 lb

## 2010-08-22 DIAGNOSIS — I2510 Atherosclerotic heart disease of native coronary artery without angina pectoris: Secondary | ICD-10-CM

## 2010-08-22 DIAGNOSIS — R0609 Other forms of dyspnea: Secondary | ICD-10-CM

## 2010-08-22 DIAGNOSIS — R0989 Other specified symptoms and signs involving the circulatory and respiratory systems: Secondary | ICD-10-CM

## 2010-08-22 DIAGNOSIS — R079 Chest pain, unspecified: Secondary | ICD-10-CM | POA: Insufficient documentation

## 2010-08-22 DIAGNOSIS — R072 Precordial pain: Secondary | ICD-10-CM

## 2010-08-22 DIAGNOSIS — R0789 Other chest pain: Secondary | ICD-10-CM

## 2010-08-22 MED ORDER — TECHNETIUM TC 99M TETROFOSMIN IV KIT
33.0000 | PACK | Freq: Once | INTRAVENOUS | Status: AC | PRN
  Administered 2010-08-22: 33 via INTRAVENOUS

## 2010-08-22 MED ORDER — REGADENOSON 0.4 MG/5ML IV SOLN
0.4000 mg | Freq: Once | INTRAVENOUS | Status: AC
  Administered 2010-08-22: 0.4 mg via INTRAVENOUS

## 2010-08-22 MED ORDER — TECHNETIUM TC 99M TETROFOSMIN IV KIT
11.0000 | PACK | Freq: Once | INTRAVENOUS | Status: AC | PRN
  Administered 2010-08-22: 11 via INTRAVENOUS

## 2010-08-22 NOTE — Progress Notes (Addendum)
[place] [address] [phone]  Cardiology Nuclear Med Study  ALDRICH REDBURN is a 60 y.o. male [id number] [birth year]   Nuclear Med Background Indication for Stress Test:  Evaluation for Ischemia History:  '10 Cath:Dx 80%, medical tx., '10 Echo:EF=60%, '11 [id number] thinning, no ischemia, EF=53% Cardiac Risk Factors: Family History - CAD, History of Smoking, Hypertension, IDDM Type 2 and Lipids  Symptoms:  Chest Pressure with and without Exertion.  (last date of chest discomfort last week), DOE and Rapid HR   Nuclear Pre-Procedure Caffeine/Decaff Intake:  None NPO After: 9:00pm   Lungs:  Clear.  o2 sat 96% on RA. IV 0.9% NS with Angio Cath:  18g  IV Site: R Antecubital  IV Started by:  Eliezer Lofts, EMT-P  Chest Size (in):  44 Cup Size: n/a  Height: 5\' 6"  (1.676 m)  Weight:  186 lb (84.369 kg)  BMI:  Body mass index is 30.02 kg/(m^2). Tech Comments:  All meds taken this day, per patient    Nuclear Med Study 1 or 2 day study: 1 day  Stress Test Type:  Treadmill/Lexiscan  Reading MD: Dorris Carnes, MD  Order Authorizing Provider:  Minus Breeding, MD  Resting Radionuclide: Technetium 50m Tetrofosmin  Resting Radionuclide Dose: 11.0 mCi   Stress Radionuclide:  Technetium 68m Tetrofosmin  Stress Radionuclide Dose: 33.0 mCi           Stress Protocol Rest HR: 56 Stress HR: 96  Rest BP: 129/82 Stress BP: 153/73  Exercise Time (min): 2:00 METS: n/a   Predicted Max HR: 160 bpm % Max HR: 60 bpm Rate Pressure Product: 14688   Dose of Adenosine (mg):  n/a Dose of Lexiscan: 0.4 mg  Dose of Atropine (mg): n/a Dose of Dobutamine: n/a mcg/kg/min (at max HR)  Stress Test Technologist: Letta Moynahan, CMA-N  Nuclear Technologist:  Charlton Amor, CNMT     Rest Procedure:  Myocardial perfusion imaging was performed at rest 45 minutes following the intravenous administration of Technetium 65m Tetrofosmin.  Rest ECG:  Nonspecific T-wave changes. ? Old SWMI.  Stress Procedure:  The patient received IV Lexiscan 0.4 mg over 15-seconds with concurrent low level exercise and then Technetium 51m Tetrofosmin was injected at 30-seconds while the patient continued walking one more minute.  There were no significant changes with Lexiscan, rare PVC.  He did c/o chest tightness with lexiscan.  Quantitative spect images were obtained after a 45-minute delay.  Stress ECG: No significant change from baseline ECG  QPS Raw Data Images:  Soft tissue (diaphragm) underlies heart. Stress Images:  Normal perfusion and apical thinning. Rest Images:  Comparison with the stress images reveals no significant change. Subtraction (SDS):  No evidence of ischemia. Transient Ischemic Dilatation (Normal <1.22):  1.20 Lung/Heart Ratio (Normal <0.45):  0.28  Quantitative Gated Spect Images QGS EDV:  125 ml QGS ESV:  64 ml QGS cine images:  LVEF appears normal by visual estimate. QGS EF: 49%  Impression Exercise Capacity:  Lexiscan with low level exercise. BP Response:  Normal blood pressure response. Clinical Symptoms:  Mild chest pain/dyspnea. ECG Impression:  No significant ST segment change suggestive of ischemia. Comparison with Prior Nuclear Study: No significant change from previous scan.  Overall Impression:  Normal perfusion with apical thinning.  No ischemia or scar.  LVEF calculated at 49%, appears greater by visual estimate.  No evidence of ischemia or infarct.  Plan as outlined in previous note.  Minus Breeding

## 2010-08-23 NOTE — H&P (Signed)
NAME:  Jon Anderson, Jon Anderson NO.:  [id number]   MEDICAL RECORD NO.:  [id number]            PATIENT TYPE:   LOCATION:                                 FACILITY:   PHYSICIAN:  Adele Barthel, MD         DATE OF BIRTH:   DATE OF ADMISSION:  DATE OF DISCHARGE:                              HISTORY & PHYSICAL   PRIMARY CARE PHYSICIAN:  Unassigned   CHIEF COMPLAINT:  Chest pain and hypertensive urgency.   HISTORY OF PRESENT ILLNESS:  [name] is a pleasant 60 year old  gentleman with a history of insulin dependent diabetes, hypertension,  hyperlipidemia and family history of early coronary artery heart  disease.  He is presenting to the ER after noticing his blood pressure  210/116 in the morning.  He has been having refractory hypertension.  He  is on several antihypertensives according to him.  For the last several  years, he has had intermittent chest pain.  He underwent a stress test 2  years back which was essentially negative.  According to him, for the  last 3-4 weeks, he is having this chest pain even at rest.  It is a  pressure-like sensation and 6/10 in intensity, lasting for anywhere from  8-10 minutes and then goes away by itself.  Since morning. he has had 3  similar episodes.  In the emergency room, his EKG was showing LVP which  was old and his set of cardiac enzymes is negative.  The [facility]  service was consulted for further evaluation and management.   PAST MEDICAL HISTORY:  1. Insulin-dependent diabetes mellitus.  2. Hyperlipidemia.  3. Hypertension.  4. Kidney stones.  5. Status post tonsillectomy and adenoidectomy.   ALLERGIES:  NO KNOWN DRUG ALLERGIES.   HOME MEDICATIONS:  Include the following:  1. Insulin pump.  2. Metformin 1000 mg q 12 h.  3. Coreg  CR 80 mg p.o. daily.  4. Clonidine 0.1 mg p.o. daily.  5. Lasix 40 mg p.o. daily.  6. Lipitor 80 mg p.o. daily.  7. Atenolol 300 mg p.o. daily.  8. Imdur 80 mg p.o. daily.  9. HCTZ 25  mg p.o. daily.   REVIEW OF SYSTEMS:  Essentially negative for except what is mentioned in  HPI.   SOCIAL HISTORY:  The patient is a Retail buyer.  He is married and has 2  children.  Denies tobacco, alcohol or IV drug abuse or marijuana or  cocaine intake.   FAMILY HISTORY:  Father is alive and well.  Mother has hypertension and  premature coronary artery disease.  One sister died of massive MI in  early 40s/ 80s.  Another sister had CAD in her 32s.  One brother had  diabetes mellitus.   EXAMINATION:  VITAL SIGNS: The patient's blood pressure is 170/90, heart  rate 62, respiratory rate 18, oxygen saturation 98% on room air.  GENERAL EXAMINATION:  No acute cardiorespiratory distress.  HEENT: EOM intact.  Moist mucous membranes.  No ear or nose discharge.  LUNGS:  Normal breath sounds.  No rhonchi or crepitations.  CARDIOVASCULAR:  S1 and  S2 normal.  No murmur, gallop or rub.  ABDOMEN: Soft, nontender.  No organomegaly.  Bowel sounds are positive.  EXTREMITIES: No edema.  PSYCH:  Oriented x3.  CNS:  Speech intact.  Follows commands.   LAB DATA:  A set of cardiac enzymes is negative.  CBC:  WBC 6.6,  hemoglobin 13.8, hematocrit 40.9, platelet count 201, INR 1.0.  Sodium  136, potassium 3.8, chloride 99, bicarbonate 29, glucose 180, BUN 21,  creatinine 1.57, calcium 9.2.  Chest x-ray pending.   ASSESSMENT AND PLAN:  1. Chest pain:  The patient's chest pain is suggestive of unstable      angina.  The patient will be made n.p.o.  Plavix load is done.      Cardioprotective medications including aspirin is continued.      Lovenox is given p.r.n.  Nitroglycerin requested.  The patient has      a high TIMI score.  Dr. Ron Parker from [facility] is consulted      for evaluation of coronaries with cardiac catheterization.  2. Hypertension:  The patient has defect a hypertension.  Most likely      he has renal artery stenosis.  I discussed with Dr. Ron Parker this      weekend to look at renal  arteries while during cardiac      catheterization to see if there is any such etiology given he is on      multiple antihypertensives.  I have added Imdur and continued the      rest of the antihypertensive except for Lasix and      hydrochlorothiazide.  3. Renal insufficiency:  Most likely most likely from Lasix, Atenolol      and HCTZ.  We are going to hold off on diuretics and gently hydrate      him to protect his kidneys from the contrast load that he received      during cardiac catheterization.  4. Diabetes mellitus:  We will continue insulin holding off metformin      because of the high creatinine.  The insulin pump will be      continued.  5. Prophylaxis:  DVT GI on board.   CODE STATUS:  The patient is a full code.  Total time spent in admission of this patient around 1 hour.      Adele Barthel, MD  Electronically Signed     NP/MEDQ  D:  06/22/2008  T:  06/22/2008  Job:  [id number]

## 2010-08-23 NOTE — Discharge Summary (Signed)
NAME:  Jon Anderson, Jon Anderson            ACCOUNT NO.:  [id number]   MEDICAL RECORD NO.:  [id number]          PATIENT TYPE:  INP   LOCATION:  [id number]                         FACILITY:  [facility]   PHYSICIAN:  Hind I Elsaid, MD      DATE OF BIRTH:  [birth year]   DATE OF ADMISSION:  06/22/2008  DATE OF DISCHARGE:  06/24/2008                               DISCHARGE SUMMARY   DISCHARGE DIAGNOSES:  1. Atypical chest pain.  2. Hypertensive urgency.  3. Insulin-dependent diabetes mellitus.  4. Hyperlipidemia.  5. History of chronic kidney disease with a creatinine around 1.5-1.4.   DISCHARGE MEDICATIONS:  1. Insulin pump.  2. Metformin 1000 mg twice daily.  3. Coreg extended release 80 mg daily.  4. Clonidine 0.4 mg twice daily.  5. Lipitor 80 mg daily.  6. Tekturna 300 mg once daily.  7. Micardis 80/25 once daily.  8. Aspirin 81 mg daily.   FOLLOWUP:  The patient need to follow up with his primary care physician  next week for further control of his blood pressure and need to follow  up with Dr. Caryl Comes from [facility] within 4-6 weeks.   PROCEDURE:  The patient underwent cardiac cath, which showed mild  coronary plaque and recommendation was medical management and chest pain  possibly related to diagonal stenosis.   HISTORY OF PRESENT ILLNESS:  This is a 60 year old gentleman with  history of diabetes, hypertension, hyperlipidemia presented with  recurrent chest pain.  He underwent stress test 2 years back, which was  recently negative.  Here in the emergency room presented with chest  pain.  Cardiology consulted, they recommended a cardiac cath.  The  patient underwent cardiac cath, result is mild coronary artery disease  and recommend medical management.  We recommended the patient to  continue with aspirin and increase clonidine to 0.1 mg twice daily.  Further adjustment of his medication to be done by his primary care  physician.      Hind Franco Collet, MD  Electronically  Signed     HIE/MEDQ  D:  06/24/2008  T:  06/25/2008  Job:  [id number]

## 2010-08-23 NOTE — Consult Note (Signed)
NAME:  Jon Anderson, Jon Anderson NO.:  [id number]   MEDICAL RECORD NO.:  [id number]          PATIENT TYPE:  INP   LOCATION:  [id number]                         FACILITY:  [facility]   PHYSICIAN:  Deboraha Sprang, MD, FACCDATE OF BIRTH:  [birth year]   DATE OF CONSULTATION:  DATE OF DISCHARGE:                                 CONSULTATION   Thank you very much for asking [place] to see Jon Anderson in consultation  because of chest pain.   Jon Anderson is a 60 year old gentleman who worked for the [employer], who has a history of crescendo chest discomfort over the  last month.  This has occurred both at rest and with mental stress,  although not particularly with physical stress.  He describes this as a  pressure, and has been just going into his left shoulder.  It does not  radiate further.  It is unaccompanied by shortness of breath or  diaphoresis, but it can be accompanied by nausea.   The reasons are not clear, he underwent catheterization in June 2001 per  the chart.  The patient does not recall this.  He had mild  nonobstructive coronary disease at that time.   His cardiac risk factors are currently notable for diabetes,  hypertension, and dyslipidemia as well as a family history wherein his  sister died in her 38s.  He has had brothers with longstanding heart  issues dying in their 65s and 86s and in his mother.   He came to medical attention on this occasion, though because of being  found to have severe systolic hypertension at the presurgical center  where he was going to have work done on a ruptured tendon in his foot.  He was referred to hospital where he is ruled out for myocardial  infarction.   Other concerns related to the above include an electrocardiogram not  significantly changed from 2007 associated with poor R wave progression,  and still concerning for a possible septal or an anteroseptal myocardial  infarction.   The patient's past medical  history in addition to the above is notable  for;  1. Mild renal insufficiency.  2. Some tachy palpitations that are not abrupt in onset or offset.   His past surgical history is notable for knee surgery and hernia  surgery.   His medical review of systems in addition to the above is notable for  glasses, some pounding in his ears, nocturia, nephrolithiasis, and  arthritis, and is otherwise negative across all other organ systems.   SOCIAL HISTORY:  He is married.  He has three children and three  grandchildren.  He does not use cigarettes, alcohol, or recreational  drugs.   Medications as an outpatient included:  1. Insulin.  2. Metformin 1000 q.12 h.  3. Coreg 80.  4. Clonidine 0.1.  5. Lasix 40.  6. Lipitor 80.  7. Tekturna 300.  8. Micardis 80.  9. Hydrochlorothiazide 25.   Medications in hospital include isosorbide 60, Plavix, Lovenox, Crestor,  Tekturna, Benicar, hydralazine p.r.n., and aspirin.  His metformin has  been held.   He has no  known drug allergies.   On examination, he is a middle-to-older aged [place] American male who  appeared his stated age of 55.  His blood pressure is 149/70 with a  pulse of 84, his respirations were 18 and unlabored.  His HEENT exam  demonstrated no icterus or xanthomata.  His neck veins were flat.  His  carotids were brisk and full bilaterally without bruits.  The back bone  is without kyphosis or scoliosis.  Lungs were clear.  Heart sounds were  regular with an S4.  The abdomen was soft with active bowel sounds.  No  midline pulsation or hepatomegaly.  Femoral pulses were 2+.  Distal  pulses were intact.  There was no clubbing, cyanosis, or edema.  Neurological exam was grossly normal.  The skin was warm and dry.   Electrocardiogram was notable for sinus rhythm at a rate of 108.  The  intervals were 0.13/0.10/0.36.  Unchanged ST segment elevation in the  anterior precordium.  There was poor R wave progression as noted.    Laboratories demonstrated negative cardiac enzymes with a peak of 0.05.  The creatinine was 1.6, and the potassium this morning was 3.2.  Blood  sugars have been in the 200 range.   IMPRESSION:  1. Chest pain concerning for angina with an escalating pattern.  2. Cardiac risk factors notable for:      a.     Hypertension.      b.     Family history with sisters less than 70, brothers less than       36, and his mother less than 70.      c.     Diabetes.      d.     Hyperlipidemia.  3. Chronic renal insufficiency - grade 3.  4. Hypokalemia.  5. Abnormal electrocardiogram.   DISCUSSION:  Jon Anderson has recurrent chest pain that is concerning  for angina.  It has an escalating pattern in the context of multiple  cardiac risk factors, abnormal electrocardiogram, and not with stating  the fact that he has a pending surgery.  Pretest probabilities, I think  make the use of Myoview scanning insufficiently sensitive.  I have  reviewed with the patient and his family the concerns that I have about  the above and the potential risks of catheterization including, but not  limited to death, myocardial infarction, stroke, bleeding, and renal  insufficiency.  He understands these risks and is willing to proceed.   Based on the above, we will therefore:  1. Anticipate catheterization this afternoon.  2. Preprocedural hydration.  3. Potassium repletion.  4. Aggressive risk factor modification.  I should note that his HDL      was 28, but his LDL was 59.      Deboraha Sprang, MD, [facility]  Electronically Signed     SCK/MEDQ  D:  06/23/2008  T:  06/24/2008  Job:  [id number]   cc:   Dr. Mellody Drown

## 2010-08-23 NOTE — Progress Notes (Signed)
COPY ROUTED TO DR.Mira Monte.Parks Neptune

## 2010-08-23 NOTE — Cardiovascular Report (Signed)
NAME:  Jon Anderson, Jon Anderson NO.:  [id number]   MEDICAL RECORD NO.:  [id number]           PATIENT TYPE:   LOCATION:                                 FACILITY:   PHYSICIAN:  Minus Breeding, MD, FACCDATE OF BIRTH:  15-May-1950   DATE OF PROCEDURE:  06/23/2008  DATE OF DISCHARGE:                            CARDIAC CATHETERIZATION   PRIMARY CARE PHYSICIAN:  Adele Barthel, MD.   PROCEDURE:  Left heart catheterization/coronary arteriography.   INDICATIONS:  Evaluate the patient with chest pain.   PROCEDURE NOTE:  Left heart catheterization performed via the right  femoral artery.  The artery was cannulated using the anterior wall  puncture.  A #5-French arterial sheath was inserted via the modified  Seldinger technique.  Preformed Judkins and pigtail catheter were  utilized.  I also did an aortogram.   RESULTS:  Hemodynamics:  LV 166/14, AO 166/86.  Coronaries, left main  had luminal irregularities.  The LAD had ostial 25% stenosis, mid 25%  stenosis, followed by another mid 30% lesion.  First diagonal was small  with ostial 80% stenosis.  Second diagonal was small branching and  normal.  Circumflex in the AV groove had diffuse luminal irregularities.  They were scattered 25% lesions.  First obtuse marginal was large  branching and normal.  The second obtuse marginal was moderate size and  normal.  The third obtuse marginal was long with 25% stenosis.  The  right coronary artery was large and dominant.  It was long proximal 25%  stenosis and mid long 25% stenosis.  The LV was not injected secondary  to renal insufficiency.  The aortogram was done secondary to difficulty  advancing the guidewire.  There was no plaque.  There was tortuous iliac  artery.   CONCLUSION:  Mild coronary plaque.  Small-vessel disease.   PLAN:  Medical management.  The pain could be related to the diagonal  stenosis.      Minus Breeding, MD, [facility]  Electronically Signed     JH/MEDQ  D:   08/06/2008  T:  08/06/2008  Job:  [id number]

## 2010-08-26 NOTE — Cardiovascular Report (Signed)
NAME:  Jon Anderson, Jon Anderson NO.:  [id number]   MEDICAL RECORD NO.:  [id number]           PATIENT TYPE:   LOCATION:                                 FACILITY:   PHYSICIAN:  Minus Breeding, MD, FACCDATE OF BIRTH:  04-29-1950   DATE OF PROCEDURE:  DATE OF DISCHARGE:                            CARDIAC CATHETERIZATION   PROCEDURE:  Left heart catheterization/coronary arteriography.   INDICATIONS:  Evaluate the patient with chest discomfort and multiple  cardiovascular risk factors.  The chest discomfort was suggestive of  unstable angina 411.1.   PROCEDURE NOTE:  Left heart catheterization performed via the right  femoral artery.  The artery was cannulated using anterior wall puncture.  A #5-French arterial sheath was inserted via the modified Seldinger  technique.  Preformed Judkins and pigtail catheter were utilized.  Of  note, I had difficulty advancing the guidewire and thus an abdominal  aortogram was obtained.  I did not inject the LV secondary to some mild  renal insufficiency, though I did cross for pressures.   RESULTS:  Hemodynamics:  LV 166/14, AO 166/86.  Coronaries, left main  had luminal irregularities.  The LAD had ostial 25% stenosis, mid 25%  stenosis, and mid 30% stenosis.  First diagonal was small with ostial  80% stenosis.  Second diagonal was small, branching, and normal.  Circumflex in the AV groove had diffuse luminal irregularities.  There  were scattered 25% lesions.  First obtuse marginal was large, branching,  and normal.  Second obtuse marginal was moderate and normal.  Third  obtuse marginal had long 25% stenosis.  The right coronary artery was a  large dominant vessel.  There was long proximal 25% stenosis and mid  long 25% stenosis.   Left Ventriculogram:  Left ventricle was not injected secondary to renal  insufficiency.   Aortogram:  Distal aortogram was done secondary to difficulty advancing  the guidewire.  There was no plaque though  he had tortuous iliacs.   CONCLUSION:  Mild coronary artery plaque.   PLAN:  The patient should have aggressive medical management and risk  reduction.  His pain could be related to the diagonal stenosis, but this  should be managed medically.      Minus Breeding, MD, [facility]  Electronically Signed     JH/MEDQ  D:  06/25/2008  T:  06/25/2008  Job:  [id number]   cc:   [name], M.D.

## 2010-08-26 NOTE — Op Note (Signed)
NAME:  Jon Anderson, Jon Anderson                      ACCOUNT NO.:  [id number]   MEDICAL RECORD NO.:  [id number]                   PATIENT TYPE:  AMB   LOCATION:  [place]                                  FACILITY:  [facility]   PHYSICIAN:  Kathalene Frames. Mayer Camel, M.D.                DATE OF BIRTH:  1950-10-21   DATE OF PROCEDURE:  06/01/2003  DATE OF DISCHARGE:                                 OPERATIVE REPORT   PREOPERATIVE DIAGNOSES:  Left knee medial meniscal tear.   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   OPERATION PERFORMED:  Left knee partial arthroscopic medial meniscectomy,  debridement of chondromalacia on the medial femoral condyle and patella  grade 3 and removal of multiple cartilaginous loose bodies.   SURGEON:  Kathalene Frames. Mayer Camel, M.D.   ASSISTANTLowell Guitar. Mercie Eon.   ANESTHESIA:  General LMA.   ESTIMATED BLOOD LOSS:  Minimal.   FLUIDS REPLACED:  800 mL crystalloids.   DRAINS:  None.   TOURNIQUET TIME:  None.   INDICATIONS FOR PROCEDURE:  The patient is a 60 year old male with MRI  proven symptomatic medial meniscal tears.  He was injured on the job.  He  has failed conservative treatment and now desires elective arthroscopic  removal of the medial meniscal tear.  We were also concerned about  chondromalacia in this 60 year old gentleman and that will part of the knee  evaluation.   DESCRIPTION OF PROCEDURE:  The patient was identified by arm band and take  to the operating room at [place].  Appropriate  anesthetic monitors were attached.  General LMA anesthesia was induced with  the patient in the supine position.  Lateral post applied to the table and  left lower extremity prepped and draped in the usual sterile fashion from  the ankle to the midthigh and we began the procedure by making standard  inferomedial and inferolateral peripatellar portals with a #11  blade  allowing introduction of the arthroscope through the inferolateral portal  and the outflow through the inferomedial portal.  We immediately encountered  multiple cartilaginous loose bodies.  Most were removed with the outflow.  A  few had to be removed with the arthroscopic graspers.  Grade 3  chondromalacia with flap tears to the lateral facet of the patella was  identified and removed with a 3.5 Gator sucker shaver.  Moving into the  medial compartment of the knee, we identified medial meniscal tear, complex  degenerative with a horizontal split and multiple radial tears.  This was  debrided back to stable margin with a combination of a 3.5 Gator sucker  shaver and a straight biter.  The ACL and the PCL were intact.  The medial  femoral condyle had grade 2 grade 3 chondromalacia diffuse.  This was  debrided back to stable margins.  The lateral compartment was  in excellent  condition regarding the meniscus as well as the articular cartilage.  The  gutters were cleared.  The scope was taken to the lateral side of the PCL  clearing the posterior compartment.  The knee was then irrigated out  arthroscopically with normal saline solution.  The arthroscopic instruments  were removed and a dressing of Xeroform, 4 x 4 dressing sponges, Webril and  Ace wrap was applied.  The patient was awakened and taken to the recovery  room without difficulty.                                               Kathalene Frames. Mayer Camel, M.D.    Alphonsus Sias  D:  06/01/2003  T:  06/01/2003  Job:  [id number]

## 2010-08-29 NOTE — Progress Notes (Signed)
Left message of results and to keep appt to follow up HTN and myoview results in more detail.  Requested pt call back with questions

## 2010-08-29 NOTE — Progress Notes (Signed)
Pt has appt for follow up of HTN and myoview results on 09/15/2010.

## 2010-09-15 ENCOUNTER — Ambulatory Visit (INDEPENDENT_AMBULATORY_CARE_PROVIDER_SITE_OTHER): Payer: BC Managed Care – PPO | Admitting: Cardiology

## 2010-09-15 ENCOUNTER — Encounter: Payer: Self-pay | Admitting: Cardiology

## 2010-09-15 DIAGNOSIS — R0989 Other specified symptoms and signs involving the circulatory and respiratory systems: Secondary | ICD-10-CM | POA: Insufficient documentation

## 2010-09-15 DIAGNOSIS — I10 Essential (primary) hypertension: Secondary | ICD-10-CM

## 2010-09-15 DIAGNOSIS — R072 Precordial pain: Secondary | ICD-10-CM

## 2010-09-15 DIAGNOSIS — E785 Hyperlipidemia, unspecified: Secondary | ICD-10-CM

## 2010-09-15 DIAGNOSIS — I2510 Atherosclerotic heart disease of native coronary artery without angina pectoris: Secondary | ICD-10-CM

## 2010-09-15 NOTE — Assessment & Plan Note (Signed)
His blood pressure is actually controlled on increased dose of clonidine. We will continue this.

## 2010-09-15 NOTE — Assessment & Plan Note (Signed)
Stress test demonstrated no high risk features. His pain is very atypical. Primary risk reduction but no further testing.

## 2010-09-15 NOTE — Assessment & Plan Note (Signed)
I will order carotid Dopplers.

## 2010-09-15 NOTE — Assessment & Plan Note (Signed)
He says this is followed by his primary doctor. With his vascular disease I would suggest a goal LDL less than 100 and HDL greater than 40.

## 2010-09-15 NOTE — Progress Notes (Signed)
HPI The patient presents for followup of chest pain. He has a history of branch vessel and nonobstructive coronary disease. He recently has had some chest discomfort. It is under his left nipple.  He describes as sharp. It happens at rest. It comes on and goes away spontaneously. There are no associated symptoms and no radiation. It might last for 5 minutes at a time. He did have a stress perfusion study which demonstrated no ischemia. He did report a slightly low ejection fraction though this was felt to be underestimated based on visual review. 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 He denies any shortness of breath, PND or orthopnea. He has had no weight gain or edema.  No Known Allergies  Current Outpatient Prescriptions  Medication Sig Dispense Refill  . amLODipine (NORVASC) 10 MG tablet Take 1 tablet (10 mg total) by mouth daily.  30 tablet  11  . aspirin EC 81 MG EC tablet Take 1 tablet (81 mg total) by mouth daily.  150 tablet  2  . carvedilol (COREG) 12.5 MG tablet Take 1 tablet (12.5 mg total) by mouth 2 (two) times daily.  60 tablet  11  . cloNIDine (CATAPRES) 0.3 MG tablet Take 0.3 mg by mouth 2 (two) times daily.        Marland Kitchen dexlansoprazole (DEXILANT) 60 MG capsule Take 60 mg by mouth daily.        . insulin aspart (NOVOLOG) 100 UNIT/ML injection Inject into the skin as directed.        . metFORMIN (GLUMETZA) 1000 MG (MOD) 24 hr tablet Take 1,000 mg by mouth 2 (two) times daily with a meal.        . pravastatin (PRAVACHOL) 80 MG tablet Take 1 tablet (80 mg total) by mouth daily.  30 tablet  11  . DISCONTD: cloNIDine (CATAPRES) 0.3 MG tablet Take 1 tablet (0.3 mg total) by mouth 2 (two) times daily.  60 tablet  11    Past Medical History  Diagnosis Date  . Diabetes mellitus   . Hyperlipidemia   . HTN (hypertension)     echo 3/10: EF 60%, LAE  . Kidney stone   . CAD (coronary artery disease)     a.  cath 3/10: oLAD 25%, mLAD 25%/30%; CFX 25%; oDx 80%  (small - tx. medically); pRCA 25%, mRCA 25%; OM3 20%;     b.  Myoview 4/11: EF 53%, no scar or ischemia  . Chronic kidney disease     Past Surgical History  Procedure Date  . Tonsillectomy   . Adenoidectomy   . Cardiac catheterization 2001 and 2010     ROS:  Dysphagia.  Otherwise as stated in the HPI and negative for all other systems.   PHYSICAL EXAM BP 141/83  Pulse 68  Resp 16  Ht 5\' 11"  (1.803 m)  Wt 188 lb 1.9 oz (85.331 kg)  BMI 26.24 kg/m2 GENERAL:  Well appearing HEENT:  Pupils equal round and reactive, fundi not visualized, oral mucosa unremarkable NECK:  No jugular venous distention, waveform within normal limits, carotid upstroke brisk and symmetric, left carotid bruits, no thyromegaly LYMPHATICS:  No cervical, inguinal adenopathy LUNGS:  Clear to auscultation bilaterally BACK:  No CVA tenderness CHEST:  Unremarkable HEART:  PMI not displaced or sustained,S1 and S2 within normal limits, no S3, no S4, no clicks, no rubs, no murmurs ABD:  Flat, positive bowel sounds normal in frequency in pitch, no bruits, no rebound, no guarding, no  midline pulsatile mass, no hepatomegaly, no splenomegaly EXT:  2 plus pulses throughout, no edema, no cyanosis no clubbing SKIN:  No rashes no nodules NEURO:  Cranial nerves II through XII grossly intact, motor grossly intact throughout PSYCH:  Cognitively intact, oriented to person place and time   ASSESSMENT AND PLAN

## 2010-09-15 NOTE — Patient Instructions (Signed)
You are being scheduled for a carotid doppler.  You will be called with the results. Please continue your current medications Follow up with Dr Percival Spanish in 1 year.

## 2010-09-22 ENCOUNTER — Encounter: Payer: Self-pay | Admitting: Physician Assistant

## 2010-10-07 ENCOUNTER — Encounter: Payer: BC Managed Care – PPO | Admitting: Cardiology

## 2010-10-31 ENCOUNTER — Encounter: Payer: Self-pay | Admitting: *Deleted

## 2011-01-10 LAB — GLUCOSE, CAPILLARY: Glucose-Capillary: 155 — ABNORMAL HIGH

## 2011-01-10 LAB — RAPID STREP SCREEN (MED CTR MEBANE ONLY): Streptococcus, Group A Screen (Direct): POSITIVE — AB

## 2011-07-29 ENCOUNTER — Other Ambulatory Visit: Payer: Self-pay | Admitting: Physician Assistant

## 2011-07-31 NOTE — Telephone Encounter (Signed)
rx sent today norvasc and coreg

## 2011-08-04 ENCOUNTER — Other Ambulatory Visit: Payer: Self-pay | Admitting: Physician Assistant

## 2012-03-19 ENCOUNTER — Inpatient Hospital Stay (HOSPITAL_COMMUNITY)
Admission: EM | Admit: 2012-03-19 | Discharge: 2012-03-20 | DRG: 143 | Disposition: A | Discharge status: Home / Self Care | Payer: BC Managed Care – PPO | Attending: Cardiology | Admitting: Cardiology

## 2012-03-19 ENCOUNTER — Emergency Department (HOSPITAL_COMMUNITY): Payer: BC Managed Care – PPO

## 2012-03-19 ENCOUNTER — Encounter (HOSPITAL_COMMUNITY): Payer: Self-pay | Admitting: *Deleted

## 2012-03-19 DIAGNOSIS — Z9089 Acquired absence of other organs: Secondary | ICD-10-CM

## 2012-03-19 DIAGNOSIS — I129 Hypertensive chronic kidney disease with stage 1 through stage 4 chronic kidney disease, or unspecified chronic kidney disease: Secondary | ICD-10-CM | POA: Diagnosis present

## 2012-03-19 DIAGNOSIS — E119 Type 2 diabetes mellitus without complications: Secondary | ICD-10-CM | POA: Diagnosis present

## 2012-03-19 DIAGNOSIS — N289 Disorder of kidney and ureter, unspecified: Secondary | ICD-10-CM | POA: Diagnosis present

## 2012-03-19 DIAGNOSIS — Z87891 Personal history of nicotine dependence: Secondary | ICD-10-CM

## 2012-03-19 DIAGNOSIS — I9589 Other hypotension: Secondary | ICD-10-CM | POA: Diagnosis not present

## 2012-03-19 DIAGNOSIS — E785 Hyperlipidemia, unspecified: Secondary | ICD-10-CM | POA: Diagnosis present

## 2012-03-19 DIAGNOSIS — R079 Chest pain, unspecified: Secondary | ICD-10-CM

## 2012-03-19 DIAGNOSIS — Z794 Long term (current) use of insulin: Secondary | ICD-10-CM

## 2012-03-19 DIAGNOSIS — N189 Chronic kidney disease, unspecified: Secondary | ICD-10-CM

## 2012-03-19 DIAGNOSIS — Z87442 Personal history of urinary calculi: Secondary | ICD-10-CM

## 2012-03-19 DIAGNOSIS — R0789 Other chest pain: Principal | ICD-10-CM | POA: Diagnosis present

## 2012-03-19 DIAGNOSIS — N184 Chronic kidney disease, stage 4 (severe): Secondary | ICD-10-CM | POA: Diagnosis present

## 2012-03-19 DIAGNOSIS — Z91199 Patient's noncompliance with other medical treatment and regimen due to unspecified reason: Secondary | ICD-10-CM

## 2012-03-19 DIAGNOSIS — Z8249 Family history of ischemic heart disease and other diseases of the circulatory system: Secondary | ICD-10-CM

## 2012-03-19 DIAGNOSIS — T463X5A Adverse effect of coronary vasodilators, initial encounter: Secondary | ICD-10-CM | POA: Diagnosis not present

## 2012-03-19 DIAGNOSIS — Z9119 Patient's noncompliance with other medical treatment and regimen: Secondary | ICD-10-CM

## 2012-03-19 DIAGNOSIS — E1129 Type 2 diabetes mellitus with other diabetic kidney complication: Secondary | ICD-10-CM | POA: Diagnosis present

## 2012-03-19 DIAGNOSIS — I2510 Atherosclerotic heart disease of native coronary artery without angina pectoris: Secondary | ICD-10-CM | POA: Diagnosis present

## 2012-03-19 DIAGNOSIS — I10 Essential (primary) hypertension: Secondary | ICD-10-CM | POA: Diagnosis present

## 2012-03-19 DIAGNOSIS — R072 Precordial pain: Secondary | ICD-10-CM

## 2012-03-19 DIAGNOSIS — Z79899 Other long term (current) drug therapy: Secondary | ICD-10-CM

## 2012-03-19 HISTORY — DX: Gastro-esophageal reflux disease without esophagitis: K21.9

## 2012-03-19 LAB — CBC
HCT: 39.5 % (ref 39.0–52.0)
Hemoglobin: 13.1 g/dL (ref 13.0–17.0)
MCH: 28.6 pg (ref 26.0–34.0)
MCHC: 33.2 g/dL (ref 30.0–36.0)
MCV: 86.2 fL (ref 78.0–100.0)
Platelets: 214 10*3/uL (ref 150–400)
RBC: 4.58 MIL/uL (ref 4.22–5.81)
RDW: 13.1 % (ref 11.5–15.5)
WBC: 6.1 10*3/uL (ref 4.0–10.5)

## 2012-03-19 LAB — POCT I-STAT TROPONIN I: Troponin i, poc: 0.05 ng/mL (ref 0.00–0.08)

## 2012-03-19 LAB — GLUCOSE, CAPILLARY
Glucose-Capillary: 106 mg/dL — ABNORMAL HIGH (ref 70–99)
Glucose-Capillary: 170 mg/dL — ABNORMAL HIGH (ref 70–99)
Glucose-Capillary: 217 mg/dL — ABNORMAL HIGH (ref 70–99)

## 2012-03-19 LAB — APTT: aPTT: 36 seconds (ref 24–37)

## 2012-03-19 LAB — CK TOTAL AND CKMB (NOT AT ARMC)
CK, MB: 5.6 ng/mL — ABNORMAL HIGH (ref 0.3–4.0)
Relative Index: 1.7 (ref 0.0–2.5)
Total CK: 321 U/L — ABNORMAL HIGH (ref 7–232)

## 2012-03-19 LAB — BASIC METABOLIC PANEL
BUN: 19 mg/dL (ref 6–23)
CO2: 28 mEq/L (ref 19–32)
Calcium: 9.6 mg/dL (ref 8.4–10.5)
Chloride: 102 mEq/L (ref 96–112)
Creatinine, Ser: 2.34 mg/dL — ABNORMAL HIGH (ref 0.50–1.35)
GFR calc Af Amer: 33 mL/min — ABNORMAL LOW (ref >90)
GFR calc non Af Amer: 28 mL/min — ABNORMAL LOW (ref >90)
Glucose, Bld: 107 mg/dL — ABNORMAL HIGH (ref 70–99)
Potassium: 3.7 mEq/L (ref 3.5–5.1)
Sodium: 141 mEq/L (ref 135–145)

## 2012-03-19 LAB — MAGNESIUM: Magnesium: 1.7 mg/dL (ref 1.5–2.5)

## 2012-03-19 LAB — TROPONIN I: Troponin I: <0.3 ng/mL (ref <0.30)

## 2012-03-19 LAB — MRSA PCR SCREENING: MRSA by PCR: NEGATIVE

## 2012-03-19 MED ORDER — SODIUM CHLORIDE 0.9 % IJ SOLN
3.0000 mL | Freq: Two times a day (BID) | INTRAMUSCULAR | Status: DC
  Administered 2012-03-19 – 2012-03-20 (×2): 3 mL via INTRAVENOUS

## 2012-03-19 MED ORDER — ACETAMINOPHEN 325 MG PO TABS: 650.0000 mg | ORAL_TABLET | ORAL | Status: DC | PRN

## 2012-03-19 MED ORDER — SODIUM CHLORIDE 0.9 % IV SOLN
250.0000 mL | INTRAVENOUS | Status: DC | PRN
  Administered 2012-03-20: 500 mL via INTRAVENOUS

## 2012-03-19 MED ORDER — INSULIN ASPART 100 UNIT/ML ~~LOC~~ SOLN
0.0000 [IU] | Freq: Three times a day (TID) | SUBCUTANEOUS | Status: DC
  Administered 2012-03-20 (×2): 3 [IU] via SUBCUTANEOUS

## 2012-03-19 MED ORDER — METOCLOPRAMIDE HCL 10 MG PO TABS
10.0000 mg | ORAL_TABLET | Freq: Two times a day (BID) | ORAL | Status: DC
  Administered 2012-03-20: 10 mg via ORAL
  Filled 2012-03-19 (×3): qty 1

## 2012-03-19 MED ORDER — ONDANSETRON HCL 4 MG/2ML IJ SOLN: 4.0000 mg | Freq: Four times a day (QID) | INTRAMUSCULAR | Status: DC | PRN

## 2012-03-19 MED ORDER — SODIUM CHLORIDE 0.9 % IJ SOLN: 3.0000 mL | INTRAMUSCULAR | Status: DC | PRN

## 2012-03-19 MED ORDER — SIMVASTATIN 20 MG PO TABS
20.0000 mg | ORAL_TABLET | Freq: Every day | ORAL | Status: DC
  Administered 2012-03-19: 20 mg via ORAL
  Filled 2012-03-19 (×2): qty 1

## 2012-03-19 MED ORDER — NITROGLYCERIN 0.4 MG SL SUBL
0.4000 mg | SUBLINGUAL_TABLET | SUBLINGUAL | Status: DC | PRN
  Filled 2012-03-19: qty 75

## 2012-03-19 MED ORDER — SODIUM CHLORIDE 0.9 % IV BOLUS (SEPSIS): 500.0000 mL | Freq: Once | INTRAVENOUS | Status: DC

## 2012-03-19 MED ORDER — NITROGLYCERIN IN D5W 200-5 MCG/ML-% IV SOLN
3.0000 ug/min | INTRAVENOUS | Status: DC
  Administered 2012-03-19: 3 ug/min via INTRAVENOUS
  Filled 2012-03-19: qty 250

## 2012-03-19 MED ORDER — ALPRAZOLAM 0.25 MG PO TABS
0.2500 mg | ORAL_TABLET | Freq: Two times a day (BID) | ORAL | Status: DC | PRN
  Administered 2012-03-19: 0.25 mg via ORAL
  Filled 2012-03-19: qty 1

## 2012-03-19 MED ORDER — TAMSULOSIN HCL 0.4 MG PO CAPS
0.4000 mg | ORAL_CAPSULE | Freq: Every day | ORAL | Status: DC
  Administered 2012-03-19 – 2012-03-20 (×2): 0.4 mg via ORAL
  Filled 2012-03-19 (×2): qty 1

## 2012-03-19 MED ORDER — PANTOPRAZOLE SODIUM 40 MG PO TBEC
40.0000 mg | DELAYED_RELEASE_TABLET | Freq: Every day | ORAL | Status: DC
  Administered 2012-03-19 – 2012-03-20 (×2): 40 mg via ORAL
  Filled 2012-03-19 (×2): qty 1

## 2012-03-19 MED ORDER — ZOLPIDEM TARTRATE 5 MG PO TABS: 5.0000 mg | ORAL_TABLET | Freq: Every evening | ORAL | Status: DC | PRN

## 2012-03-19 MED ORDER — CARVEDILOL 12.5 MG PO TABS
12.5000 mg | ORAL_TABLET | Freq: Two times a day (BID) | ORAL | Status: DC
  Administered 2012-03-19 – 2012-03-20 (×3): 12.5 mg via ORAL
  Filled 2012-03-19 (×4): qty 1

## 2012-03-19 MED ORDER — NITROGLYCERIN 0.4 MG SL SUBL
0.4000 mg | SUBLINGUAL_TABLET | SUBLINGUAL | Status: DC | PRN
  Administered 2012-03-19: 0.4 mg via SUBLINGUAL
  Filled 2012-03-19: qty 25

## 2012-03-19 MED ORDER — HYDROCODONE-ACETAMINOPHEN 5-325 MG PO TABS
1.0000 | ORAL_TABLET | ORAL | Status: DC | PRN
  Administered 2012-03-19: 1 via ORAL
  Administered 2012-03-20 (×2): 2 via ORAL
  Filled 2012-03-19: qty 2
  Filled 2012-03-19 (×2): qty 1
  Filled 2012-03-19: qty 2

## 2012-03-19 MED ORDER — ONDANSETRON HCL 4 MG/2ML IJ SOLN
INTRAMUSCULAR | Status: AC
  Administered 2012-03-19: 4 mg
  Filled 2012-03-19: qty 2

## 2012-03-19 MED ORDER — CLONIDINE HCL 0.3 MG PO TABS
0.3000 mg | ORAL_TABLET | Freq: Two times a day (BID) | ORAL | Status: DC
  Administered 2012-03-19 – 2012-03-20 (×2): 0.3 mg via ORAL
  Filled 2012-03-19 (×2): qty 1
  Filled 2012-03-19: qty 3

## 2012-03-19 NOTE — H&P (Signed)
CARDIOLOGY HISTORY AND PHYSICAL  Patient ID:  Jon Anderson  MRN: [id number]  DOB/AGE: [birth year] 17 y.o.  Admit date: 03/19/2012  Primary [place], MD  Primary Cardiologist [facility]  Reason for Consultation chest pain  [id number] Jon Anderson is a 61 y.o. male with a history of nonobstructive CAD. Approximately 3 weeks ago, he developed chest pain. It is left-sided and does not radiate. It is not changed with deep inspiration or cough. It waxes and wanes from a 3/10 to a 6 or 7/10 but has been continuous since it began 3 weeks ago. He has had some shortness of breath. He has not been diaphoretic or had nausea and vomiting. Physical stress does not change the pain but it seems to get worse when he is under emotional stress, which has been the case recently. He has not had fever or a productive cough. He has not taken his blood pressure medications recently. He also has problems with diabetes and states his sugar goes very high and very low. Currently, he has a tremor which he states occurs when his blood sugar is less than 100 but upon checking, his blood sugar was 170.    Past Medical History   Diagnosis  Date   .  Diabetes mellitus    .  Hyperlipidemia    .  HTN (hypertension)      echo 3/10: EF 60%, LAE   .  Kidney stone    .  CAD (coronary artery disease)      a. cath 3/10: oLAD 25%, mLAD 25%; CFX 25%; oDx 80% (small - tx. medically); pRCA 25%, mRCA 25%; OM3 20%; b. Myoview 4/11: EF 53%, no scar or ischemia c. MV 2012 Nl perfusion, apical thinning. No ischemia or scar. EF 49%, appears greater by visual estimate.   .  Chronic kidney disease     Past Surgical History   Procedure  Date   .  Tonsillectomy    .  Adenoidectomy    .  Cardiac catheterization  2001 and 2010    Allergies   Allergen  Reactions   .  Kiwi Extract  Swelling    I have reviewed the patient's current medications    HYDROcodone-acetaminophen  Medication  Sig   carvedilol (COREG) 12.5 MG tablet   Take 1 tablet (12.5 mg total) by mouth 2 (two) times daily with a meal.   cloNIDine (CATAPRES) 0.3 MG tablet  Take 0.3 mg by mouth 2 (two) times daily.   cyclobenzaprine (FLEXERIL) 10 MG tablet  Take 10 mg by mouth daily as needed. For cramps   esomeprazole (NEXIUM) 40 MG capsule  Take 40 mg by mouth daily before breakfast.   insulin aspart (NOVOLOG) 100 UNIT/ML injection  Inject 10-20 Units into the skin 3 (three) times daily before meals. Sliding scale   losartan (COZAAR) 100 MG tablet  Take 100 mg by mouth daily.   metFORMIN 1000 MG 24 hr tablet  Take 1,000 mg by mouth 2 (two) times daily with a meal.   metoCLOPramide (REGLAN) 10 MG tablet  Take 10 mg by mouth 2 (two) times daily.   pravastatin (PRAVACHOL) 80 MG tablet  TAKE ONE TABLET BY MOUTH DAILY   Tamsulosin HCl (FLOMAX) 0.4 MG CAPS  Take 0.4 mg by mouth daily.    History    Social History   .  Marital Status:  Married     Spouse Name:  N/A     Number of Children:  N/A   .  Years of Education:  N/A    Occupational History   .  Not on file.    Social History Main Topics   .  Smoking status:  Former Research scientist (life sciences)   .  Smokeless tobacco:  Not on file   .  Alcohol Use:  No   .  Drug Use:  No   .  Sexually Active:  Not on file    Other Topics  Concern   .  Not on file    Social History Narrative    The patient is a Retail buyer. He is married and has 2 children. Denies tobacco, alcohol or IV drug abuse or marijuana or cocaine intake.    Family History   Problem  Relation  Age of Onset   .  Hypotension     .  Coronary artery disease     .  Heart attack     .  Diabetes      ROS: He has occasional musculoskeletal aches and pains. He has not had any recent GI symptoms or melena. He has had no upper respiratory illnesses, fevers or chills. Full 14 point review of systems complete and found to be negative unless listed above.  Physical Exam:  Blood pressure 209/109, pulse 80, temperature 98.4 F (36.9 C), temperature source Oral, resp.  rate 18, height 5\' 11"  (1.803 m), weight 195 lb (88.451 kg), SpO2 98.00%.  General: Well developed, well nourished, male who has a tremor  Head: Eyes PERRLA, No xanthomas. Normocephalic and atraumatic, oropharynx without edema or exudate. Dentition: Poor  Lungs: Few rales bilaterally; slight tenderness to the left chest Jon Anderson near the axilla but this does not change the intensity of his chest pain which is currently a 6/10  Heart: HRRR S1 S2, no rub/gallop, no significant murmur. pulses are 2+ all 4 extrem.  Neck: Left carotid bruit. No lymphadenopathy. JVD 6-8 cm.  Abdomen: Bowel sounds present, abdomen soft and non-tender without masses or hernias noted.  Msk: No spine or cva tenderness. No weakness, no joint deformities or effusions.  Extremities: No clubbing or cyanosis. No edema.  Neuro: Alert and oriented X 3. No focal deficits noted.  Psych: Good affect, responds appropriately  Skin: No rashes or lesions noted.  Labs:  Lab Results   Component  Value  Date    WBC  6.1  03/19/2012    HGB  13.1  03/19/2012    HCT  39.5  03/19/2012    MCV  86.2  03/19/2012    PLT  214  03/19/2012    No results found for this basename: INR in the last 72 hours   Lab  03/19/12 1249   NA  141   K  3.7   CL  102   CO2  28   BUN  19   CREATININE  2.34*   CALCIUM  9.6   PROT  --   BILITOT  --   ALKPHOS  --   ALT  --   AST  --   GLUCOSE  107*     Basename  03/19/12 1259   TROPIPOC  0.05    Echo: 06/23/2008  SUMMARY - Left ventricular ejection fraction was estimated to be 60 %. There was an increased relative contribution of atrial contraction to left ventricular filling. - The left atrium was dilated.  ECG:19-Mar-2012 12:43:25 [facility]/ED ROUTINE RECORD  Normal sinus rhythm  ST & T wave abnormality, consider inferior ischemia  Abnormal ECG  25mm/s 71mm/mV  100Hz  8.0.1 12SL 241 CID: 1  Referred by: Unconfirmed  Vent. rate 83 BPM  PR interval 126 ms  QRS duration 100  ms  QT/QTc 378/444 ms  P-R-T axes 72 47 -2  Radiology: Dg Chest 2 View  03/19/2012 *RADIOLOGY REPORT* Clinical Data: Chest pain. CHEST - 2 VIEW Comparison: December 01, 2009. Findings: Cardiomediastinal silhouette appears normal. No acute pulmonary disease is noted. Bony thorax is intact. IMPRESSION: No acute cardiopulmonary abnormality seen. Original Report Authenticated By: Marijo Conception., M.D.  ASSESSMENT AND PLAN: The patient was seen today by Dr. Verl Blalock, the patient evaluated and the data reviewed.  Principal Problem:  *Left-sided chest Celene Pippins pain - Will try Vicodin for the pain as it has been continuous for several weeks without enzyme elevations. However, his ECG has some T-wave changes from an ECG dated 2012 that may be from poorly treated HTN. He admitted to being out of his BP meds for several weeks. All of the ECG changes are consistent with LVH and strain. We will admit to rule out MI and consider an echocardiogram. Restart his beta blocker and clonidine as well as his statin. IV nitroglycerin can be added as well. Decide on further evaluation in a.m. after enzymes were cycled, but very reluctant to cath with his renal insufficiency (worse than previous values).    Otherwise, restart home medications except for Cozaar, Flexeril and metformin  Active Problems:  DM  HYPERLIPIDEMIA  HYPERTENSION  CAD (coronary artery disease)  Chronic kidney disease, stage III    Signed:  Rhonda Barrett  03/19/2012, 5:31 PM  Co-Sign MD I have taken a history, reviewed medications, allergies, PMH, SH, FH, and reviewed ROS and examined the patient. I feel the EKG changes are related to his hypertensive crisis. If troponins are positive we can attribute this to demand ischemia. His creatinine precludes invasive assessment. Would preform stress Myoview to risk stratify as OP. Have given SL NTG and will begin IV NTG to decrease BP. Long conversation about the importance of medical compliance.  Ambria Mayfield C.  Verl Blalock, MD, [place] Pager:  [phone]

## 2012-03-19 NOTE — ED Provider Notes (Addendum)
History     CSN: [id number]  Arrival date & time 03/19/12  1235   First MD Initiated Contact with Patient 03/19/12 1527      Chief Complaint  Patient presents with  . Chest Pain    (Consider location/radiation/quality/duration/timing/severity/associated sxs/prior treatment) Patient is a 61 y.o. male presenting with chest pain. The history is provided by the patient.  Chest Pain    patient here with chest pain x2 weeks that has been getting worse. Symptoms are at rest and associated with dyspnea. They became associate with nausea vomiting today. Last for approximately 20-30 minutes. Does have a prior history of coronary artery disease. Denies any fever or cough. No medications taken for this and nothing makes the symptoms better worse. He's been out of his antihypertensive medications now for one month due to financial reasons. Past Medical History  Diagnosis Date  . Diabetes mellitus   . Hyperlipidemia   . HTN (hypertension)     echo 3/10: EF 60%, LAE  . Kidney stone   . CAD (coronary artery disease)     a.  cath 3/10: oLAD 25%, mLAD 25%/30%; CFX 25%; oDx 80% (small - tx. medically); pRCA 25%, mRCA 25%; OM3 20%;     b.  Myoview 4/11: EF 53%, no scar or ischemia  . Chronic kidney disease     Past Surgical History  Procedure Date  . Tonsillectomy   . Adenoidectomy   . Cardiac catheterization 2001 and 2010     Family History  Problem Relation Age of Onset  . Hypotension    . Coronary artery disease    . Heart attack    . Diabetes      History  Substance Use Topics  . Smoking status: Former Research scientist (life sciences)  . Smokeless tobacco: Not on file  . Alcohol Use: No      Review of Systems  Cardiovascular: Positive for chest pain.  All other systems reviewed and are negative.    Allergies  Review of patient's allergies indicates no known allergies.  Home Medications   Current Outpatient Rx  Name  Route  Sig  Dispense  Refill  . AMLODIPINE BESYLATE 10 MG PO TABS    Oral   Take 1 tablet (10 mg total) by mouth daily. Please make appointment to see Dr. Percival Spanish   30 tablet   2   . CARVEDILOL 12.5 MG PO TABS   Oral   Take 1 tablet (12.5 mg total) by mouth 2 (two) times daily with a meal. Please make appointment to see Dr. Percival Spanish   60 tablet   2   . CLONIDINE HCL 0.3 MG PO TABS   Oral   Take 0.3 mg by mouth 2 (two) times daily.           Marland Kitchen CLONIDINE HCL 0.3 MG PO TABS      TAKE ONE TABLET BY MOUTH TWICE DAILY   60 tablet   5   . DEXLANSOPRAZOLE 60 MG PO CPDR   Oral   Take 60 mg by mouth daily.           . INSULIN ASPART 100 UNIT/ML  SOLN   Subcutaneous   Inject into the skin as directed.           Marland Kitchen METFORMIN HCL ER (MOD) 1000 MG PO TB24   Oral   Take 1,000 mg by mouth 2 (two) times daily with a meal.           . PRAVASTATIN SODIUM  80 MG PO TABS      TAKE ONE TABLET BY MOUTH DAILY   30 tablet   5     BP 220/94  Pulse 80  Temp 98.4 F (36.9 C) (Oral)  Resp 16  Ht 5\' 11"  (1.803 m)  Wt 195 lb (88.451 kg)  BMI 27.20 kg/m2  SpO2 99%  Physical Exam  Nursing note and vitals reviewed. Constitutional: He is oriented to person, place, and time. He appears well-developed and well-nourished.  Non-toxic appearance. No distress.  HENT:  Head: Normocephalic and atraumatic.  Eyes: Conjunctivae normal, EOM and lids are normal. Pupils are equal, round, and reactive to light.  Neck: Normal range of motion. Neck supple. No tracheal deviation present. No mass present.  Cardiovascular: Normal rate, regular rhythm and normal heart sounds.  Exam reveals no gallop.   No murmur heard. Pulmonary/Chest: Effort normal and breath sounds normal. No stridor. No respiratory distress. He has no decreased breath sounds. He has no wheezes. He has no rhonchi. He has no rales.  Abdominal: Soft. Normal appearance and bowel sounds are normal. He exhibits no distension. There is no tenderness. There is no rebound and no CVA tenderness.   Musculoskeletal: Normal range of motion. He exhibits no edema and no tenderness.  Neurological: He is alert and oriented to person, place, and time. He has normal strength. No cranial nerve deficit or sensory deficit. GCS eye subscore is 4. GCS verbal subscore is 5. GCS motor subscore is 6.  Skin: Skin is warm and dry. No abrasion and no rash noted.  Psychiatric: He has a normal mood and affect. His speech is normal and behavior is normal.    ED Course  Procedures (including critical care time)  Labs Reviewed  BASIC METABOLIC PANEL - Abnormal; Notable for the following:    Glucose, Bld 107 (*)     Creatinine, Ser 2.34 (*)     GFR calc non Af Amer 28 (*)     GFR calc Af Amer 33 (*)     All other components within normal limits  GLUCOSE, CAPILLARY - Abnormal; Notable for the following:    Glucose-Capillary 106 (*)     All other components within normal limits  CBC  POCT I-STAT TROPONIN I   Dg Chest 2 View  03/19/2012  *RADIOLOGY REPORT*  Clinical Data: Chest pain.  CHEST - 2 VIEW  Comparison: December 01, 2009.  Findings: Cardiomediastinal silhouette appears normal.  No acute pulmonary disease is noted.  Bony thorax is intact.  IMPRESSION: No acute cardiopulmonary abnormality seen.   Original Report Authenticated By: Marijo Conception.,  M.D.      No diagnosis found.    MDM   Date: 01/20/2012  Rate: 83  Rhythm: normal sinus rhythm  QRS Axis: normal  Intervals: normal  ST/T Wave abnormalities: nonspecific ST changes  Conduction Disutrbances:none  Narrative Interpretation:   Old EKG Reviewed: none available     5:08 PM Cardiology to see  Leota Jacobsen, MD 03/19/12 1708  Leota Jacobsen, MD 03/19/12 [phone]

## 2012-03-19 NOTE — ED Notes (Signed)
Pt reports that he has had (L) sided chest pain x 2 weeks.  Reports SOB, nausea/vomiting today.  Reports that the pain changes-does not increase or decrease' with laying flat.

## 2012-03-19 NOTE — ED Notes (Addendum)
Upon entering the pts room. Pt was tachypnic and complaining of severe CP and SOB. BP noted to be 100/59. IV nitro stopped and fluid rate increase. EDP and cardiology made aware. RN remained at bedside until pt stable.

## 2012-03-19 NOTE — ED Notes (Signed)
CBG completed in triage: 106

## 2012-03-19 NOTE — ED Notes (Signed)
Pt noted to be shaky, reports that he thinks his sugar is dropping.  Pt given OJ.

## 2012-03-20 ENCOUNTER — Encounter (HOSPITAL_COMMUNITY): Payer: Self-pay | Admitting: *Deleted

## 2012-03-20 ENCOUNTER — Other Ambulatory Visit: Payer: Self-pay | Admitting: *Deleted

## 2012-03-20 DIAGNOSIS — N189 Chronic kidney disease, unspecified: Secondary | ICD-10-CM

## 2012-03-20 DIAGNOSIS — R072 Precordial pain: Secondary | ICD-10-CM

## 2012-03-20 DIAGNOSIS — I2510 Atherosclerotic heart disease of native coronary artery without angina pectoris: Secondary | ICD-10-CM

## 2012-03-20 LAB — COMPREHENSIVE METABOLIC PANEL
ALT: 13 U/L (ref 0–53)
AST: 12 U/L (ref 0–37)
Albumin: 2.9 g/dL — ABNORMAL LOW (ref 3.5–5.2)
Alkaline Phosphatase: 81 U/L (ref 39–117)
BUN: 24 mg/dL — ABNORMAL HIGH (ref 6–23)
CO2: 28 mEq/L (ref 19–32)
Calcium: 9 mg/dL (ref 8.4–10.5)
Chloride: 107 mEq/L (ref 96–112)
Creatinine, Ser: 2.63 mg/dL — ABNORMAL HIGH (ref 0.50–1.35)
GFR calc Af Amer: 28 mL/min — ABNORMAL LOW (ref >90)
GFR calc non Af Amer: 24 mL/min — ABNORMAL LOW (ref >90)
Glucose, Bld: 174 mg/dL — ABNORMAL HIGH (ref 70–99)
Potassium: 3.9 mEq/L (ref 3.5–5.1)
Sodium: 141 mEq/L (ref 135–145)
Total Bilirubin: 0.3 mg/dL (ref 0.3–1.2)
Total Protein: 6.4 g/dL (ref 6.0–8.3)

## 2012-03-20 LAB — HEMOGLOBIN A1C
Hgb A1c MFr Bld: 8.1 % — ABNORMAL HIGH (ref <5.7)
Mean Plasma Glucose: 186 mg/dL — ABNORMAL HIGH (ref <117)

## 2012-03-20 LAB — LIPID PANEL
Cholesterol: 237 mg/dL — ABNORMAL HIGH (ref 0–200)
HDL: 51 mg/dL (ref >39)
LDL Cholesterol: 164 mg/dL — ABNORMAL HIGH (ref 0–99)
Total CHOL/HDL Ratio: 4.6 RATIO
Triglycerides: 110 mg/dL (ref <150)
VLDL: 22 mg/dL (ref 0–40)

## 2012-03-20 LAB — TSH: TSH: 0.885 u[IU]/mL (ref 0.350–4.500)

## 2012-03-20 LAB — TROPONIN I
Troponin I: <0.3 ng/mL (ref <0.30)
Troponin I: <0.3 ng/mL (ref <0.30)

## 2012-03-20 LAB — GLUCOSE, CAPILLARY
Glucose-Capillary: 161 mg/dL — ABNORMAL HIGH (ref 70–99)
Glucose-Capillary: 136 mg/dL — ABNORMAL HIGH (ref 70–99)
Glucose-Capillary: 160 mg/dL — ABNORMAL HIGH (ref 70–99)

## 2012-03-20 MED ORDER — INSULIN ASPART 100 UNIT/ML ~~LOC~~ SOLN: 10.0000 [IU] | Freq: Three times a day (TID) | SUBCUTANEOUS | Status: DC

## 2012-03-20 MED ORDER — TAMSULOSIN HCL 0.4 MG PO CAPS: 0.4000 mg | ORAL_CAPSULE | Freq: Every day | ORAL | Status: DC

## 2012-03-20 MED ORDER — ROSUVASTATIN CALCIUM 40 MG PO TABS: 40.0000 mg | ORAL_TABLET | Freq: Every day | ORAL | Status: DC

## 2012-03-20 MED ORDER — SODIUM CHLORIDE 0.9 % IV SOLN
10.0000 ug/kg | INTRAVENOUS | Status: DC
  Filled 2012-03-20: qty 12

## 2012-03-20 MED ORDER — NITROGLYCERIN 0.4 MG SL SUBL: 0.4000 mg | SUBLINGUAL_TABLET | SUBLINGUAL | Status: DC | PRN

## 2012-03-20 MED ORDER — METFORMIN HCL ER (MOD) 1000 MG PO TB24: 1000.0000 mg | ORAL_TABLET | Freq: Two times a day (BID) | ORAL | Status: DC

## 2012-03-20 MED ORDER — HYDROCODONE-ACETAMINOPHEN 5-325 MG PO TABS
1.0000 | ORAL_TABLET | ORAL | Status: DC | PRN
Start: 2012-03-20 — End: 2012-11-14

## 2012-03-20 MED ORDER — CLONIDINE HCL 0.3 MG PO TABS: 0.3000 mg | ORAL_TABLET | Freq: Two times a day (BID) | ORAL | Status: DC

## 2012-03-20 MED ORDER — METOCLOPRAMIDE HCL 10 MG PO TABS: 10.0000 mg | ORAL_TABLET | Freq: Two times a day (BID) | ORAL | Status: DC

## 2012-03-20 MED ORDER — AMLODIPINE BESYLATE 2.5 MG PO TABS: 2.5000 mg | ORAL_TABLET | Freq: Every day | ORAL | Status: DC

## 2012-03-20 MED ORDER — CARVEDILOL 12.5 MG PO TABS: 12.5000 mg | ORAL_TABLET | Freq: Two times a day (BID) | ORAL | Status: DC

## 2012-03-20 MED ORDER — ESOMEPRAZOLE MAGNESIUM 40 MG PO CPDR: 40.0000 mg | DELAYED_RELEASE_CAPSULE | Freq: Every day | ORAL | Status: DC

## 2012-03-20 MED ORDER — ATORVASTATIN CALCIUM 80 MG PO TABS
80.0000 mg | ORAL_TABLET | Freq: Every day | ORAL | Status: DC
  Administered 2012-03-20: 80 mg via ORAL
  Filled 2012-03-20: qty 1

## 2012-03-20 MED ORDER — AMLODIPINE BESYLATE 2.5 MG PO TABS
2.5000 mg | ORAL_TABLET | Freq: Every day | ORAL | Status: DC
  Administered 2012-03-20: 2.5 mg via ORAL
  Filled 2012-03-20: qty 1

## 2012-03-20 NOTE — Progress Notes (Signed)
  Echocardiogram Echocardiogram Pharmacologic Stress Test has been performed.  [place], [place] 03/20/2012, 3:20 PM

## 2012-03-20 NOTE — Progress Notes (Signed)
Dobutamine echocardiogram performed.

## 2012-03-20 NOTE — Care Management Note (Signed)
    Page 1 of 1   03/20/2012     9:23:29 AM   CARE MANAGEMENT NOTE 03/20/2012  Patient:  Jon Anderson, Jon Anderson   Account Number:  [id number]  Date Initiated:  03/20/2012  Documentation initiated by:  Elissa Hefty  Subjective/Objective Assessment:   adm w ch pain     Action/Plan:   lives w wife, pcp dr [name]   Anticipated DC Date:     Anticipated DC Plan:  [place]  CM consult      Choice offered to / List presented to:             Status of service:   Medicare Important Message given?   (If response is "NO", the following Medicare IM given date fields will be blank) Date Medicare IM given:   Date Additional Medicare IM given:    Discharge Disposition:  HOME/SELF CARE  Per UR Regulation:  Reviewed for med. necessity/level of care/duration of stay  If discussed at [place] of Stay Meetings, dates discussed:    Comments:  12/11 9:22a debbie Neoma Uhrich rn,bsn [id number]

## 2012-03-20 NOTE — Progress Notes (Signed)
Patient was prescribed alternate less expensive meds due to limited resources, patient to continue Novolog Insulin as previous with 1/2 dose in evening until different payment source takes effect 1/14 and he can resume his insulin pump, patient ambulatory with wife for discharge to home, Hazle Nordmann Rn

## 2012-03-20 NOTE — Progress Notes (Signed)
Discharge instructions reviewed with patient and family utilizing teachback method, verbalizes understanding of followup and medications, and diet, discharged to home with wife via private vehicle to home, Hazle Nordmann RN

## 2012-03-20 NOTE — Discharge Summary (Signed)
CARDIOLOGY DISCHARGE SUMMARY   Patient ID: Jon Anderson MRN: [id number] DOB/AGE: 07-03-1950 61 y.o.  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 He had prolonged chest pain he came to the hospital where he was admitted for further evaluation and treatment.  His ECG was abnormal but Dr. wall felt this was possibly secondary to LVH and strain from untreated hypertension. His pain responded to oral pain medications. His ECG had some new changes to it and initially IV nitroglycerin was used. However, he became hypotensive with the IV/sublingual nitroglycerin so they were discontinued.   His blood pressure was significantly elevated on admission. He admitted to being out of his medications for over a month. His home blood pressure medications were restarted with the exception of Cozaar. His kidney function was significantly abnormal. 2 years ago, his creatinine was 1.9. On admission, it was 2.34 with a GFR of 33. The patient denied any awareness of abnormal kidney function. He is to followup with [facility] and a consult will be ordered as an outpatient.   His hemoglobin A1c was elevated at 8.1. Compliance with a diabetic diet is encouraged and he was seen by the diabetes coordinator. He has an insulin pump but hasn't used it recently because he is out of supplies. He has been using meal coverage insulin but gets hypoglycemia. For now, he is to resume his home regimen with a decrease in his pm meal coverage to avoid morning hypoglycemia (which has been a problem) and follow up with primary care. He has an appointment scheduled in  January.  His cardiac enzymes were cycled and were negative for MI. Since his enzymes were negative, he had a dobutamine echocardiogram to assess for ischemia. This was performed on 03/20/2012. Results are below, no ischemia seen.  His blood pressure medications were restarted and his blood pressure control improved. These can be adjusted as an outpatient. On 03/20/2012, Mr. Slauson was evaluated by Dr. Percival Spanish. His chest pain is improved and responding to pain medications. He is considered stable for discharge, to followup as an outpatient.  Labs:  Lab Results  Component Value Date   WBC 6.1 03/19/2012   HGB 13.1 03/19/2012   HCT 39.5 03/19/2012   MCV 86.2 03/19/2012   PLT 214 03/19/2012     Lab 03/20/12 0700  NA 141  K 3.9  CL 107  CO2 28  BUN 24*  CREATININE 2.63*  CALCIUM 9.0  PROT 6.4  BILITOT 0.3  ALKPHOS 81  ALT 13  AST 12  GLUCOSE 174*    Basename 03/20/12 0700 03/20/12 0131 03/19/12 1900 03/19/12 1800  CKTOTAL -- -- -- 321*  CKMB -- -- -- 5.6*  CKMBINDEX -- -- -- --  TROPONINI <0.30 <0.30 <0.30 --   Lipid Panel     Component Value Date/Time   CHOL 237* 03/20/2012 0700   TRIG 110 03/20/2012 0700   HDL 51 03/20/2012 0700   CHOLHDL 4.6 03/20/2012 0700   VLDL 22 03/20/2012 0700   LDLCALC 164* 03/20/2012 0700   Lab Results  Component Value Date   HGBA1C 8.1* 03/19/2012    Radiology: Dg Chest 2 View  03/19/2012  *RADIOLOGY REPORT*  Clinical Data:  Chest pain.  CHEST - 2 VIEW  Comparison: December 01, 2009.  Findings: Cardiomediastinal silhouette appears normal.  No acute pulmonary disease is noted.  Bony thorax is intact.  IMPRESSION: No acute cardiopulmonary abnormality seen.   Original Report Authenticated By: Marijo Conception.,  M.D.    EKG:  20-Mar-2012 07:45:03 [facility] ROUTINE RECORD Sinus bradycardia T wave abnormality, consider lateral ischemia Abnormal ECG Since last tracing Rate slower st-t changes persist. 51mm/s  33mm/mV 100Hz  8.0.1 12SL 241 HD CID: [id number] Referred by: Confirmed By: Terance Ice MD Vent. rate 52 BPM PR interval 142 ms QRS duration 106 ms QT/QTc 478/444 ms P-R-T axes 46 11 125  Echo:  03/20/2012 Stress results: There were no ST or T wave changes to suggest ischemia. Maximal heart rate during stress was 137bpm (87% of maximal predicted heart rate). The maximal predicted heart rate was 158bpm.The target heart rate was achieved. The heart rate response to stress was normal. There was resting hypertension. Normal blood pressure response to dobutamine. The rate-pressure product for the peak heart rate and blood pressure was 19656mm Hg/min.   FOLLOW UP PLANS AND APPOINTMENTS Allergies  Allergen Reactions  . Kiwi Extract Swelling     Medication List     As of 03/22/2012  8:44 AM    STOP taking these medications         cyclobenzaprine 10 MG tablet   Commonly known as: FLEXERIL      losartan 100 MG tablet   Commonly known as: COZAAR      pravastatin 80 MG tablet   Commonly known as: PRAVACHOL      TAKE these medications         amLODipine 2.5 MG tablet   Commonly known as: NORVASC   Take 1 tablet (2.5 mg total) by mouth daily.      carvedilol 12.5 MG tablet   Commonly known as: COREG   Take 1 tablet (12.5 mg total) by mouth 2 (two) times daily with a meal. Please make appointment to see Dr. Percival Spanish      cloNIDine 0.3 MG tablet   Commonly known as: CATAPRES   Take 1 tablet (0.3 mg total) by mouth 2 (two) times daily.      esomeprazole 40 MG capsule   Commonly known as: NEXIUM   Take 1 capsule (40 mg total) by mouth daily before breakfast.      HYDROcodone-acetaminophen 5-325 MG per tablet   Commonly known as: NORCO/VICODIN   Take 1-2 tablets by mouth every 4 (four) hours as needed.      insulin aspart 100 UNIT/ML injection   Commonly known as: novoLOG   Inject 10-20 Units into the skin 3 (three) times daily before meals. Sliding scale - use 1/2 as much  for the evening dose.      metFORMIN 1000 MG (MOD) 24 hr tablet   Commonly known as: GLUMETZA   Take 1 tablet (1,000 mg total) by mouth 2 (two) times daily with a meal.      metoCLOPramide 10 MG tablet   Commonly known as: REGLAN   Take 1 tablet (10 mg total) by mouth 2 (two) times daily.      nitroGLYCERIN 0.4 MG SL tablet   Commonly known as: NITROSTAT   Place 1 tablet (0.4 mg total) under the tongue every 5 (five) minutes as needed for chest pain.      rosuvastatin 40 MG tablet   Commonly known as: CRESTOR   Take 1  tablet (40 mg total) by mouth daily.      Tamsulosin HCl 0.4 MG Caps   Commonly known as: FLOMAX   Take 1 capsule (0.4 mg total) by mouth daily.         Discharge Orders    Future Appointments: Provider: Department: Dept Phone: Center:   04/09/2012 9:50 AM Liliane Shi, PA [facility] [place]) [phone] [id number]     Future Orders Please Complete By Expires   Diet - low sodium heart healthy      Diet Carb Modified      Increase activity slowly        Follow-up Information    Follow up with [facility]. (They will call.)    Contact information:   [address] [place] [phone]      Follow up with Richardson Dopp, PA. On 04/09/2012. (See for Dr Percival Spanish at 9:50 am)    Contact information:   [address] [address] [phone]          BRING ALL MEDICATIONS WITH YOU TO FOLLOW UP APPOINTMENTS  Time spent with patient to include physician time: 42 min Signed: Rosaria Ferries 03/22/2012, 8:44 AM Co-Sign MD

## 2012-03-20 NOTE — Progress Notes (Signed)
SUBJECTIVE:  Mild chest pain last night but not today.    PHYSICAL EXAM Filed Vitals:   03/19/12 2200 03/19/12 2324 03/20/12 0354 03/20/12 0745  BP:  144/78 171/92 150/83  Pulse:  61 59 55  Temp:  97.6 F (36.4 C) 97.5 F (36.4 C) 97.7 F (36.5 C)  TempSrc:  Oral Oral Oral  Resp: 18 14 12 17   Height: 5\' 11"  (1.803 m)     Weight: 174 lb 6.1 oz (79.1 kg)     SpO2:  100% 98% 99%   General:  No distress HEENT:  PERRL Lungs:  Clear Heart:  RRR, no rub Abdomen:  Positive bowel sounds, no rebound no guarding Extremities:  No edema  LABS: Lab Results  Component Value Date   CKTOTAL 321* 03/19/2012   CKMB 5.6* 03/19/2012   TROPONINI <0.30 03/20/2012   Results for orders placed during the hospital encounter of 03/19/12 (from the past 24 hour(s))  BASIC METABOLIC PANEL     Status: Abnormal   Collection Time   03/19/12 12:49 PM      Component Value Range   Sodium 141  135 - 145 mEq/L   Potassium 3.7  3.5 - 5.1 mEq/L   Chloride 102  96 - 112 mEq/L   CO2 28  19 - 32 mEq/L   Glucose, Bld 107 (*) 70 - 99 mg/dL   BUN 19  6 - 23 mg/dL   Creatinine, Ser 2.34 (*) 0.50 - 1.35 mg/dL   Calcium 9.6  8.4 - 10.5 mg/dL   GFR calc non Af Amer 28 (*) >90 mL/min   GFR calc Af Amer 33 (*) >90 mL/min  CBC     Status: Normal   Collection Time   03/19/12 12:49 PM      Component Value Range   WBC 6.1  4.0 - 10.5 K/uL   RBC 4.58  4.22 - 5.81 MIL/uL   Hemoglobin 13.1  13.0 - 17.0 g/dL   HCT 39.5  39.0 - 52.0 %   MCV 86.2  78.0 - 100.0 fL   MCH 28.6  26.0 - 34.0 pg   MCHC 33.2  30.0 - 36.0 g/dL   RDW 13.1  11.5 - 15.5 %   Platelets 214  150 - 400 K/uL  GLUCOSE, CAPILLARY     Status: Abnormal   Collection Time   03/19/12 12:53 PM      Component Value Range   Glucose-Capillary 106 (*) 70 - 99 mg/dL  POCT I-STAT TROPONIN I     Status: Normal   Collection Time   03/19/12 12:59 PM      Component Value Range   Troponin i, poc 0.05  0.00 - 0.08 ng/mL   Comment 3           GLUCOSE,  CAPILLARY     Status: Abnormal   Collection Time   03/19/12  4:45 PM      Component Value Range   Glucose-Capillary 170 (*) 70 - 99 mg/dL  CK TOTAL AND CKMB     Status: Abnormal   Collection Time   03/19/12  6:00 PM      Component Value Range   Total CK 321 (*) 7 - 232 U/L   CK, MB 5.6 (*) 0.3 - 4.0 ng/mL   Relative Index 1.7  0.0 - 2.5  TROPONIN I     Status: Normal   Collection Time   03/19/12  7:00 PM      Component Value Range  Troponin I <0.30  <0.30 ng/mL  APTT     Status: Normal   Collection Time   03/19/12  7:01 PM      Component Value Range   aPTT 36  24 - 37 seconds  MAGNESIUM     Status: Normal   Collection Time   03/19/12  7:01 PM      Component Value Range   Magnesium 1.7  1.5 - 2.5 mg/dL  HEMOGLOBIN A1C     Status: Abnormal   Collection Time   03/19/12  7:01 PM      Component Value Range   Hemoglobin A1C 8.1 (*) <5.7 %   Mean Plasma Glucose 186 (*) <117 mg/dL  MRSA PCR SCREENING     Status: Normal   Collection Time   03/19/12  9:40 PM      Component Value Range   MRSA by PCR NEGATIVE  NEGATIVE  GLUCOSE, CAPILLARY     Status: Abnormal   Collection Time   03/19/12 11:37 PM      Component Value Range   Glucose-Capillary 217 (*) 70 - 99 mg/dL  TROPONIN I     Status: Normal   Collection Time   03/20/12  1:31 AM      Component Value Range   Troponin I <0.30  <0.30 ng/mL  TROPONIN I     Status: Normal   Collection Time   03/20/12  7:00 AM      Component Value Range   Troponin I <0.30  <0.30 ng/mL  COMPREHENSIVE METABOLIC PANEL     Status: Abnormal   Collection Time   03/20/12  7:00 AM      Component Value Range   Sodium 141  135 - 145 mEq/L   Potassium 3.9  3.5 - 5.1 mEq/L   Chloride 107  96 - 112 mEq/L   CO2 28  19 - 32 mEq/L   Glucose, Bld 174 (*) 70 - 99 mg/dL   BUN 24 (*) 6 - 23 mg/dL   Creatinine, Ser 2.63 (*) 0.50 - 1.35 mg/dL   Calcium 9.0  8.4 - 10.5 mg/dL   Total Protein 6.4  6.0 - 8.3 g/dL   Albumin 2.9 (*) 3.5 - 5.2 g/dL   AST 12   0 - 37 U/L   ALT 13  0 - 53 U/L   Alkaline Phosphatase 81  39 - 117 U/L   Total Bilirubin 0.3  0.3 - 1.2 mg/dL   GFR calc non Af Amer 24 (*) >90 mL/min   GFR calc Af Amer 28 (*) >90 mL/min  LIPID PANEL     Status: Abnormal   Collection Time   03/20/12  7:00 AM      Component Value Range   Cholesterol 237 (*) 0 - 200 mg/dL   Triglycerides 110  <150 mg/dL   HDL 51  >39 mg/dL   Total CHOL/HDL Ratio 4.6     VLDL 22  0 - 40 mg/dL   LDL Cholesterol 164 (*) 0 - 99 mg/dL  GLUCOSE, CAPILLARY     Status: Abnormal   Collection Time   03/20/12  7:55 AM      Component Value Range   Glucose-Capillary 161 (*) 70 - 99 mg/dL    Intake/Output Summary (Last 24 hours) at 03/20/12 0823 Last data filed at 03/19/12 2100  Gross per 24 hour  Intake      0 ml  Output    150 ml  Net   -150 ml  EKG:  NSR, rate 52, lateral T wave inversion more pronounced than previous.    ASSESSMENT AND PLAN:   Left-sided chest wall pain:  This is atypical. However, he does have EKG changes and risk factors.  I will try to schedule a dobutamine echocardiogram for today.   DM  A1c is 8.1.  Continue metformin and educate.  Further management as an outpatient.   HYPERLIPIDEMIA:  LDL is not at all at target.  I will change to Crestor 40 mg  HYPERTENSION:  BP is labile but elevated.  Add  Norvasc  CAD (coronary artery disease):  Evaluate as above.  Chronic kidney disease:  Acute on chronic Stage III CKD.  This is likely secondary to HTN and diabetes.  He will need out patient follow up with nephrologly.  I had a long conversation with him about the above issues today.  He has not been taking his medications.  He understands the risk of renal failure in particular.     [name] [facility] 03/20/2012 8:23 AM

## 2012-03-20 NOTE — Progress Notes (Signed)
Inpatient Diabetes Program Recommendations  AACE/ADA: New Consensus Statement on Inpatient Glycemic Control (2013)  Target Ranges:  Prepandial:   less than 140 mg/dL      Peak postprandial:   less than 180 mg/dL (1-2 hours)      Critically ill patients:  140 - 180 mg/dL   Reason for Visit: Elevated HgbA1C  Pt has insulin pump but hasn't used it in a few months d/t no supplies.  Then most recently he's been taking Novolog 10 units before meals.  States he has hypoglycemia quite frequently.  Treats with OJ. 61 y.o. male with a history of nonobstructive CAD. Approximately 3 weeks ago, he developed chest pain. It is left-sided and does not radiate. It is not changed with deep inspiration or cough. It waxes and wanes from a 3/10 to a 6 or 7/10 but has been continuous since it began 3 weeks ago  Results for CAMAURI, HONTS (MRN [id number]) as of 03/20/2012 14:10  Ref. Range 03/20/2012 07:00  Sodium Latest Range: 135-145 mEq/L 141  Potassium Latest Range: 3.5-5.1 mEq/L 3.9  Chloride Latest Range: 96-112 mEq/L 107  CO2 Latest Range: 19-32 mEq/L 28  BUN Latest Range: 6-23 mg/dL 24 (H)  Creatinine Latest Range: 0.50-1.35 mg/dL 2.63 (H)  Calcium Latest Range: 8.4-10.5 mg/dL 9.0  GFR calc non Af Amer Latest Range: >90 mL/min 24 (L)  GFR calc Af Amer Latest Range: >90 mL/min 28 (L)  Glucose Latest Range: 70-99 mg/dL 174 (H)  Results for ANGELITA, GASPARINI (MRN [id number]) as of 03/20/2012 14:10  Ref. Range 03/19/2012 16:45 03/19/2012 23:37 03/20/2012 07:55 03/20/2012 12:21  Glucose-Capillary Latest Range: 70-99 mg/dL 170 (H) 217 (H) 161 (H) 136 (H)    Results for THOMES, CORPE (MRN [id number]) as of 03/20/2012 14:10  Ref. Range 03/19/2012 19:01  Hemoglobin A1C Latest Range: <5.7 % 8.1 (H)   CBGs controlled at present.  Pt stated pump settings were 0.8 units/hour of regular insulin.  Recommendations:  May benefit from addition of small amount of basal insulin - Lantus 8 units  QHS. Would add Novolog 2 units tidwc for meal coverage insulin when po intake > 50%.    Will continue to follow.  Thank you. Lorenda Peck, RD, LDN, CDE Inpatient Diabetes Coordinator [phone]

## 2012-03-20 NOTE — OR Nursing (Signed)
Dobutamine study done 1504 Dobutamine 34mcg/kg started HR 57 170/93 1507 Dobutamine 20 mcg/kg started 189/97 1510 dobutamine 30 mcg/kg started 218/82 99 1513 dobutamine 40 mcg/kg started HR 137 1514 dobutamine off Denies chest tightness or chest pain

## 2012-04-09 ENCOUNTER — Encounter: Payer: BC Managed Care – PPO | Admitting: Physician Assistant

## 2012-04-16 ENCOUNTER — Encounter: Payer: BC Managed Care – PPO | Admitting: Physician Assistant

## 2012-04-17 ENCOUNTER — Ambulatory Visit (INDEPENDENT_AMBULATORY_CARE_PROVIDER_SITE_OTHER): Payer: BC Managed Care – PPO | Admitting: Physician Assistant

## 2012-04-17 ENCOUNTER — Other Ambulatory Visit: Payer: Self-pay | Admitting: Physician Assistant

## 2012-04-17 ENCOUNTER — Encounter: Payer: Self-pay | Admitting: Physician Assistant

## 2012-04-17 VITALS — BP 210/100 | HR 67 | Ht 71.0 in | Wt 183.0 lb

## 2012-04-17 DIAGNOSIS — I2510 Atherosclerotic heart disease of native coronary artery without angina pectoris: Secondary | ICD-10-CM

## 2012-04-17 DIAGNOSIS — I10 Essential (primary) hypertension: Secondary | ICD-10-CM

## 2012-04-17 DIAGNOSIS — N183 Chronic kidney disease, stage 3 unspecified: Secondary | ICD-10-CM

## 2012-04-17 DIAGNOSIS — R071 Chest pain on breathing: Secondary | ICD-10-CM

## 2012-04-17 DIAGNOSIS — R0789 Other chest pain: Secondary | ICD-10-CM

## 2012-04-17 MED ORDER — AMLODIPINE BESYLATE 5 MG PO TABS: 5.0000 mg | ORAL_TABLET | Freq: Every day | ORAL | Status: DC

## 2012-04-17 NOTE — Patient Instructions (Addendum)
INCREASE AMLODIPINE TO 5 MG DAILY Take Aspirin 81 mg daily. RESTART CLONIDINE TODAY; TAKE THIS AS SOON AS YOU PICK UP YOUR MEDICATION RESTART NEXIUM TODAY  YOU HAVE A FOLLOW UP APPT WITH SCOTT WEAVER, [facility] ON 05/06/12 @ 9:50 AM   2 Gram Low Sodium Diet A 2 gram sodium diet restricts the amount of sodium in the diet to no more than 2 g or 2000 mg daily. Limiting the amount of sodium is often used to help lower blood pressure. It is important if you have heart, liver, or kidney problems. Many foods contain sodium for flavor and sometimes as a preservative. When the amount of sodium in a diet needs to be low, it is important to know what to look for when choosing foods and drinks. The following includes some information and guidelines to help make it easier for you to adapt to a low sodium diet. QUICK TIPS  Do not add salt to food.  Avoid convenience items and fast food.  Choose unsalted snack foods.  Buy lower sodium products, often labeled as "lower sodium" or "no salt added."  Check food labels to learn how much sodium is in 1 serving.  When eating at a restaurant, ask that your food be prepared with less salt or none, if possible. READING FOOD LABELS FOR SODIUM INFORMATION The nutrition facts label is a good place to find how much sodium is in foods. Look for products with no more than 500 to 600 mg of sodium per meal and no more than 150 mg per serving. Remember that 2 g = 2000 mg. The food label may also list foods as:  Sodium-free: Less than 5 mg in a serving.  Very low sodium: 35 mg or less in a serving.  Low-sodium: 140 mg or less in a serving.  Light in sodium: 50% less sodium in a serving. For example, if a food that usually has 300 mg of sodium is changed to become light in sodium, it will have 150 mg of sodium.  Reduced sodium: 25% less sodium in a serving. For example, if a food that usually has 400 mg of sodium is changed to reduced sodium, it will have 300 mg of  sodium. CHOOSING FOODS Grains  Avoid: Salted crackers and snack items. Some cereals, including instant hot cereals. Bread stuffing and biscuit mixes. Seasoned rice or pasta mixes.  Choose: Unsalted snack items. Low-sodium cereals, oats, puffed wheat and rice, shredded wheat. English muffins and bread. Pasta. Meats  Avoid: Salted, canned, smoked, spiced, pickled meats, including fish and poultry. Bacon, ham, sausage, cold cuts, hot dogs, anchovies.  Choose: Low-sodium canned tuna and salmon. Fresh or frozen meat, poultry, and fish. Dairy  Avoid: Processed cheese and spreads. Cottage cheese. Buttermilk and condensed milk. Regular cheese.  Choose: Milk. Low-sodium cottage cheese. Yogurt. Sour cream. Low-sodium cheese. Fruits and Vegetables  Avoid: Regular canned vegetables. Regular canned tomato sauce and paste. Frozen vegetables in sauces. Olives. Angie Fava. Relishes. Sauerkraut.  Choose: Low-sodium canned vegetables. Low-sodium tomato sauce and paste. Frozen or fresh vegetables. Fresh and frozen fruit. Condiments  Avoid: Canned and packaged gravies. Worcestershire sauce. Tartar sauce. Barbecue sauce. Soy sauce. Steak sauce. Ketchup. Onion, garlic, and table salt. Meat flavorings and tenderizers.  Choose: Fresh and dried herbs and spices. Low-sodium varieties of mustard and ketchup. Lemon juice. Tabasco sauce. Horseradish. SAMPLE 2 GRAM SODIUM MEAL PLAN Breakfast / Sodium (mg)  1 cup low-fat milk / [id number] mg  2 slices whole-wheat toast / 270 mg  1 tbs heart-healthy margarine / 153 mg  1 hard-boiled egg / 139 mg  1 small orange / 0 mg Lunch / Sodium (mg)  1 cup raw carrots / 76 mg   cup hummus / 298 mg  1 cup low-fat milk / 143 mg   cup red grapes / 2 mg  1 whole-wheat pita bread / 356 mg Dinner / Sodium (mg)  1 cup whole-wheat pasta / 2 mg  1 cup low-sodium tomato sauce / 73 mg  3 oz lean ground beef / 57 mg  1 small side salad (1 cup raw spinach leaves,  cup  cucumber,  cup yellow bell pepper) with 1 tsp olive oil and 1 tsp red wine vinegar / 25 mg Snack / Sodium (mg)  1 container low-fat vanilla yogurt / 107 mg  3 graham cracker squares / 127 mg Nutrient Analysis  Calories: 2033  Protein: 77 g  Carbohydrate: 282 g  Fat: 72 g  Sodium: 1971 mg Document Released: 03/27/2005 Document Revised: 06/19/2011 Document Reviewed: 06/28/2009 [facility] Patient Information 2013 [place], [place].

## 2012-04-17 NOTE — Progress Notes (Signed)
[address], [place], [place]  [address] Phone: [phone], Fax:  [phone]  Date:  04/17/2012   Name:  Jon Anderson   DOB:  11/04/1950   MRN:  [id number]  PCP:  [name], MD  Primary Cardiologist:  Dr. Minus Breeding  Primary Electrophysiologist:  None    History of Present Illness: Jon Anderson is a 62 y.o. male who returns for follow up after recent admission to the hospital with chest pain.   He has a hx of non-obstructive CAD, good LVF, HTN, HL, DM2 and CKD.  LHC 3/10:  oLAD 25%, mLAD 25%, oD1 80% (small), CFX scattered 25%, OM3 25%, pRCA 25%, mRCA 25%.  Echo 3/10:  EF 60%, LAE.  Lexiscan Myoview 5/12:  EF 49%, no ischemia or scar.  Patient last seen by Dr. Percival Spanish 6/12. Carotid bruit was noted and Dopplers were ordered. I do not see that these are done. Patient was admitted 12/10-12/13. He presented with prolonged chest pain. ECG was felt to be consistent with LVH and strain from uncontrolled hypertension. Patient was out of his medications for blood pressure. Patient's creatinine was noted to be abnormal and was fairly consistent with his previous readings. He has been referred to nephrology. Cardiac markers were negative. Patient underwent dobutamine stress echocardiogram prior to discharge.  Dobutamine stress echo 12/13:  Negative Dobutamine stress echo. There is no evidence of ischemia.  The LV function is normal.  Since d/c, he continues to have left sided chest pain.  It is no better.  It is no worse.  Not really exertional.  Does note that it is possibly worse with eating.  Notes significant issues with post prandial nausea.  He is out of his Nexium.  Saw his PCP earlier this week with persistent hiccups.  He notes some dyspnea.  Probably NYHA class II.  No orthopnea, PND.  Notes ankle edema.  This is unchanged.  No syncope.  BP is very high today.  He ran out of his clonidine.  Last dose was 2 days ago.  Reports compliance with all other  medications.    Labs (8/11):    K 3.6, creatinine 1.9 Labs (12/13):  K 3.9, creatinine 2.63, ALT 13,  LDL 164, Hgb 13.1, TSH 0.885   Wt Readings from Last 3 Encounters:  04/17/12 183 lb (83.008 kg)  03/19/12 174 lb 6.1 oz (79.1 kg)  09/15/10 188 lb 1.9 oz (85.331 kg)     Past Medical History  Diagnosis Date  . Diabetes mellitus   . Hyperlipidemia   . HTN (hypertension)     echo 3/10: EF 60%, LAE  . CAD (coronary artery disease)     a.  cath 3/10: oLAD 25%, mLAD 25%; CFX 25%; oDx 80% (small - tx. medically); pRCA 25%, mRCA 25%; OM3 20%;     b.  Myoview 4/11: EF 53%, no scar or ischemia   c. MV 2012 Nl perfusion, apical thinning.  No ischemia or scar.  EF 49%, appears greater by visual estimate.;  d.  Dob stress echo 12/13:  Negative Dob stress echo. There is no evidence of ischemia.  The LVF is normal.  . Anginal pain   . GERD (gastroesophageal reflux disease)   . Kidney stone   . Chronic kidney disease (CKD), stage III (moderate)     Current Outpatient Prescriptions  Medication Sig Dispense Refill  . amLODipine (NORVASC) 2.5 MG tablet Take 1 tablet (2.5 mg total) by mouth daily.  30 tablet  1  . carvedilol (COREG) 25 MG tablet Take 25 mg by mouth 2 (two) times daily with a meal.      . cloNIDine (CATAPRES) 0.3 MG tablet Take 1 tablet (0.3 mg total) by mouth 2 (two) times daily.  30 tablet  11  . esomeprazole (NEXIUM) 40 MG capsule Take 1 capsule (40 mg total) by mouth daily before breakfast.  30 capsule  3  . HYDROcodone-acetaminophen (NORCO/VICODIN) 5-325 MG per tablet Take 1-2 tablets by mouth every 4 (four) hours as needed.  30 tablet  0  . insulin aspart (NOVOLOG) 100 UNIT/ML injection Inject 10-20 Units into the skin 3 (three) times daily before meals. Sliding scale - use 1/2 as much for the evening dose.  1 vial  1  . metFORMIN (GLUMETZA) 1000 MG (MOD) 24 hr tablet Take 1 tablet (1,000 mg total) by mouth 2 (two) times daily with a meal.  60 tablet  1  . metoCLOPramide (REGLAN)  10 MG tablet Take 1 tablet (10 mg total) by mouth 2 (two) times daily.  60 tablet  1  . nitroGLYCERIN (NITROSTAT) 0.4 MG SL tablet Place 1 tablet (0.4 mg total) under the tongue every 5 (five) minutes as needed for chest pain.  25 tablet  3  . rosuvastatin (CRESTOR) 40 MG tablet Take 1 tablet (40 mg total) by mouth daily.  30 tablet  11  . Tamsulosin HCl (FLOMAX) 0.4 MG CAPS Take 1 capsule (0.4 mg total) by mouth daily.  30 capsule  1    Allergies: Allergies  Allergen Reactions  . Kiwi Extract Swelling    Social History:  The patient  reports that he has quit smoking. He does not have any smokeless tobacco history on file. He reports that he does not drink alcohol or use illicit drugs.   ROS:  Please see the history of present illness.   No TIA or stroke symptoms.  No HAs.  No melena, hematochezia.   All other systems reviewed and negative.   PHYSICAL EXAM: VS:  BP 210/100  Pulse 67  Ht 5\' 11"  (1.803 m)  Wt 183 lb (83.008 kg)  BMI 25.52 kg/m2  SpO2 99% Well nourished, well developed, in no acute distress HEENT: normal Neck: no JVD Cardiac:  normal S1, S2; RRR; no murmur Lungs:  clear to auscultation bilaterally, no wheezing, rhonchi or rales Abd: soft, nontender, no hepatomegaly Ext: no edema Skin: warm and dry Neuro:  CNs 2-12 intact, no focal abnormalities noted  EKG:  NSR, HR 67, normal axis, LVH with repol abnormality, no significant changes.  ASSESSMENT AND PLAN:  1. Uncontrolled Hypertension:  We discussed the importance of continued treatment with clonidine and the risk of rebound HTN.  He plans to pick up his clonidine today.  He will restart this today.  He may have too much salt in his diet. I will give him a 2 gm Na diet to follow.  I will also increase his Amlodipine to 5 mg QD.  PCP increased Coreg earlier this week.  2. Chest Pain:  Atypical for ischemia.  He had a recent negative workup in the hospital and a low risk stress echo.  I suspect his symptoms are more  GI related given his nausea and vomiting with meals.  He is to restart his Nexium today as well and follow up with his PCP in 2 weeks.  He may need referral to GI, but I will leave this up to his PCP.  3. Coronary Artery Disease:  Continue ASA 81 mg QD, Crestor.  4. GERD:   Follow up with PCP as noted above.  He may need referral to GI but this will be per his PCP.  5. Chronic Kidney Disease:  He sees nephrology later this week.  We discussed the importance of BP control to prevent further progression of CKD.  Danton Sewer, PA-C  10:21 AM 04/17/2012

## 2012-05-06 ENCOUNTER — Ambulatory Visit: Payer: BC Managed Care – PPO | Admitting: Physician Assistant

## 2012-05-08 ENCOUNTER — Ambulatory Visit: Payer: BC Managed Care – PPO | Admitting: Physician Assistant

## 2012-05-13 ENCOUNTER — Other Ambulatory Visit: Payer: Self-pay | Admitting: Nephrology

## 2012-05-13 DIAGNOSIS — R7989 Other specified abnormal findings of blood chemistry: Secondary | ICD-10-CM

## 2012-05-14 ENCOUNTER — Encounter: Payer: Self-pay | Admitting: Physician Assistant

## 2012-05-14 ENCOUNTER — Ambulatory Visit (INDEPENDENT_AMBULATORY_CARE_PROVIDER_SITE_OTHER): Payer: BC Managed Care – PPO | Admitting: Physician Assistant

## 2012-05-14 VITALS — BP 160/84 | HR 57 | Ht 71.0 in | Wt 186.4 lb

## 2012-05-14 DIAGNOSIS — I10 Essential (primary) hypertension: Secondary | ICD-10-CM

## 2012-05-14 DIAGNOSIS — R072 Precordial pain: Secondary | ICD-10-CM

## 2012-05-14 DIAGNOSIS — R0989 Other specified symptoms and signs involving the circulatory and respiratory systems: Secondary | ICD-10-CM

## 2012-05-14 MED ORDER — AMLODIPINE BESYLATE 10 MG PO TABS: 10.0000 mg | ORAL_TABLET | Freq: Every day | ORAL | Status: DC

## 2012-05-14 NOTE — Progress Notes (Signed)
[address] [place], [place]  [address] Phone: [phone], Fax:  [phone]  Date:  05/14/2012   ID:  Jon Anderson, DOB [birth year], MRN [id number]  PCP:  Jon Panda, MD  Primary Cardiologist:  Dr. Minus Breeding     History of Present Illness: Jon Anderson is a 62 y.o. male who returns for follow up on HTN.  He has a hx of non-obstructive CAD, good LVF, HTN, HL, DM2 and CKD. LHC 3/10: oLAD 25%, mLAD 25%, oD1 80% (small), CFX scattered 25%, OM3 25%, pRCA 25%, mRCA 25%. Echo 3/10: EF 60%, LAE. Lexiscan Myoview 5/12: EF 49%, no ischemia or scar. He was admitted 03/2012 with prolonged chest pain. MI was ruled out.  He was referred to nephrology for CKD.  Dobutamine stress echo 12/13: Negative Dobutamine stress echo. There is no evidence of ischemia. The LV function is normal.   I saw him 04/17/12 in f/u.  He continued to note left-sided chest pain. He was out of his Nexium and did note symptoms possibly worse after eating. His blood pressure was markedly elevated. He was out of his clonidine. This was resumed and I increased his amlodipine. Nexium was also restarted.  He notes less chest pain since restarting his PPI.  He denies exertional chest pain.  Denies significant dyspnea.  NYHA Class II.  No orthopnea, PND. He has mild pedal edema.  No syncope.  He notes audible heart beat in his ears especially at night.      Labs (8/11): K 3.6, creatinine 1.9  Labs (12/13): K 3.9, creatinine 2.63, ALT 13, LDL 164, Hgb 13.1, TSH 0.885    Wt Readings from Last 3 Encounters:  04/17/12 183 lb (83.008 kg)  03/19/12 174 lb 6.1 oz (79.1 kg)  09/15/10 188 lb 1.9 oz (85.331 kg)     Past Medical History  Diagnosis Date  . Diabetes mellitus   . Hyperlipidemia   . HTN (hypertension)     echo 3/10: EF 60%, LAE  . CAD (coronary artery disease)     a.  cath 3/10: oLAD 25%, mLAD 25%; CFX 25%; oDx 80% (small - tx. medically); pRCA 25%, mRCA 25%; OM3 20%;     b.  Myoview  4/11: EF 53%, no scar or ischemia   c. MV 2012 Nl perfusion, apical thinning.  No ischemia or scar.  EF 49%, appears greater by visual estimate.;  d.  Dob stress echo 12/13:  Negative Dob stress echo. There is no evidence of ischemia.  The LVF is normal.  . Anginal pain   . GERD (gastroesophageal reflux disease)   . Kidney stone   . Chronic kidney disease (CKD), stage III (moderate)     Current Outpatient Prescriptions  Medication Sig Dispense Refill  . amLODipine (NORVASC) 5 MG tablet Take 1 tablet (5 mg total) by mouth daily.  30 tablet  11  . carvedilol (COREG) 25 MG tablet Take 25 mg by mouth 2 (two) times daily with a meal.      . cloNIDine (CATAPRES) 0.3 MG tablet Take 1 tablet (0.3 mg total) by mouth 2 (two) times daily.  30 tablet  11  . cloNIDine (CATAPRES) 0.3 MG tablet TAKE ONE TABLET BY MOUTH TWICE DAILY  60 tablet  10  . esomeprazole (NEXIUM) 40 MG capsule Take 1 capsule (40 mg total) by mouth daily before breakfast.  30 capsule  3  . HYDROcodone-acetaminophen (NORCO/VICODIN) 5-325 MG per tablet Take 1-2 tablets by mouth every 4 (  four) hours as needed.  30 tablet  0  . insulin aspart (NOVOLOG) 100 UNIT/ML injection Inject 10-20 Units into the skin 3 (three) times daily before meals. Sliding scale - use 1/2 as much for the evening dose.  1 vial  1  . metFORMIN (GLUMETZA) 1000 MG (MOD) 24 hr tablet Take 1 tablet (1,000 mg total) by mouth 2 (two) times daily with a meal.  60 tablet  1  . metoCLOPramide (REGLAN) 10 MG tablet Take 1 tablet (10 mg total) by mouth 2 (two) times daily.  60 tablet  1  . nitroGLYCERIN (NITROSTAT) 0.4 MG SL tablet Place 1 tablet (0.4 mg total) under the tongue every 5 (five) minutes as needed for chest pain.  25 tablet  3  . rosuvastatin (CRESTOR) 40 MG tablet Take 1 tablet (40 mg total) by mouth daily.  30 tablet  11  . Tamsulosin HCl (FLOMAX) 0.4 MG CAPS Take 1 capsule (0.4 mg total) by mouth daily.  30 capsule  1    Allergies:    Allergies  Allergen  Reactions  . Kiwi Extract Swelling    Social History:  The patient  reports that he has quit smoking. He does not have any smokeless tobacco history on file. He reports that he does not drink alcohol or use illicit drugs.   ROS:  Please see the history of present illness.     All other systems reviewed and negative.   PHYSICAL EXAM: VS:  BP 160/84  Pulse 57  Ht 5\' 11"  (1.803 m)  Wt 186 lb 6.4 oz (84.55 kg)  BMI 26.00 kg/m2 Well nourished, well developed, in no acute distress HEENT: normal Neck: no JVD Vascular:  + Bilateral carotid bruits Cardiac:  normal S1, S2; RRR; no murmur Lungs:  clear to auscultation bilaterally, no wheezing, rhonchi or rales Abd: soft, nontender, no hepatomegaly Ext: trace bilateral ankle edema Skin: warm and dry Neuro:  CNs 2-12 intact, no focal abnormalities noted  EKG:  Sinus brady, HR 57, normal axis, PRWP, no significant changes     ASSESSMENT AND PLAN:  1. Chest Pain:  I suspect that his chest pain is gastrointestinal in nature. He seems to have had some improvement with restarting his Nexium. He had a recent low risk stress test and prior history of nonobstructive CAD. He denies exertional symptoms. Continue current cardiac therapy. I have recommended that he followup with his gastroenterologist for further evaluation. 2. Hypertension:  Better controlled. Increase amlodipine to 10 mg daily to hopefully get him to target. 3. Hyperlipidemia:  Followed by primary care. 4. Chronic Kidney Disease:  He has renal ultrasound pending tomorrow. He continues to followup with nephrology. 5. GERD:  As noted, I have recommended he followup with gastroenterology. 6. Coronary Artery Disease:  Continue aspirin and statin. 7. Disposition:  Followup with Dr. Percival Spanish in 4 months.  Signed, Richardson Dopp, PA-C  9:59 AM 05/14/2012

## 2012-05-14 NOTE — Patient Instructions (Addendum)
Your physician wants you to follow-up in: [place] DR. HOCHREIN, You will receive a reminder letter in the mail two months in advance. If you don't receive a letter, please call our office to schedule the follow-up appointment.  Your physician has requested that you have a carotid duplex DX BILATERAL BRUIT. This test is an ultrasound of the carotid arteries in your neck. It looks at blood flow through these arteries that supply the brain with blood. Allow one hour for this exam. There are no restrictions or special instructions.  INCREASE AMLODIPINE TO 10 MG DAILY; NEW RX SENT IN TODAY FOR THE 10 MG TABLET  PLEASE CALL YOUR GI DOCTOR FOR FOLLOW UP APPT; PLEASE CALL SCOTT WEAVER, PAC [phone] WITH THE NAME OD YOUR GI DOCTOR SO THAT HE MAY SEND HIS OV NOTE FROM TODAY

## 2012-05-15 ENCOUNTER — Ambulatory Visit
Admission: RE | Admit: 2012-05-15 | Discharge: 2012-05-15 | Disposition: A | Discharge status: Home / Self Care | Payer: BC Managed Care – PPO | Source: Ambulatory Visit | Attending: Nephrology | Admitting: Nephrology

## 2012-05-15 DIAGNOSIS — R7989 Other specified abnormal findings of blood chemistry: Secondary | ICD-10-CM

## 2012-05-21 ENCOUNTER — Encounter (INDEPENDENT_AMBULATORY_CARE_PROVIDER_SITE_OTHER): Payer: BC Managed Care – PPO

## 2012-05-21 DIAGNOSIS — I6529 Occlusion and stenosis of unspecified carotid artery: Secondary | ICD-10-CM

## 2012-05-21 DIAGNOSIS — R0989 Other specified symptoms and signs involving the circulatory and respiratory systems: Secondary | ICD-10-CM

## 2012-05-27 ENCOUNTER — Telehealth: Payer: Self-pay | Admitting: *Deleted

## 2012-05-27 ENCOUNTER — Encounter: Payer: Self-pay | Admitting: Physician Assistant

## 2012-05-27 DIAGNOSIS — I2510 Atherosclerotic heart disease of native coronary artery without angina pectoris: Secondary | ICD-10-CM

## 2012-05-27 NOTE — Telephone Encounter (Signed)
lmptcb to go over test results

## 2012-05-27 NOTE — Telephone Encounter (Signed)
Message copied by Michae Kava on Mon May 27, 2012  3:15 PM ------      Message from: [place], [place] T      Created: Mon May 27, 2012  1:50 PM       Mild plaque on right and moderate plaque on left      0-39% RICA      [id number] LICA      Repeat dopplers in 6 mos.       Make sure he takes ASA 81 mg QD (unless he is allergic or has bleeding problems from ASA).  Do not see it on his list.      Richardson Dopp, PA-C  1:48 PM 05/27/2012 ------

## 2012-05-28 ENCOUNTER — Other Ambulatory Visit (HOSPITAL_COMMUNITY): Payer: Self-pay | Admitting: Physician Assistant

## 2012-05-28 MED ORDER — ASPIRIN EC 81 MG PO TBEC: 81.0000 mg | DELAYED_RELEASE_TABLET | Freq: Every day | ORAL | Status: DC

## 2012-05-28 NOTE — Telephone Encounter (Signed)
pt notified about carotid dopplers results. repeat carotids in 6 months. pt will start  ASA 81 mg per the recommendation from [place]. med list changed today to reflect ASA started

## 2012-05-29 NOTE — Telephone Encounter (Signed)
Terin I spoke with Event organiser. He said that we could fill it one more time then the pcp needed to address filling this Horton Chin RN

## 2012-07-23 ENCOUNTER — Encounter: Payer: Self-pay | Admitting: Cardiology

## 2012-08-19 ENCOUNTER — Other Ambulatory Visit (HOSPITAL_COMMUNITY): Payer: Self-pay | Admitting: Cardiology

## 2012-08-20 ENCOUNTER — Other Ambulatory Visit: Payer: Self-pay | Admitting: Internal Medicine

## 2012-08-20 DIAGNOSIS — R609 Edema, unspecified: Secondary | ICD-10-CM

## 2012-08-23 ENCOUNTER — Ambulatory Visit
Admission: RE | Admit: 2012-08-23 | Discharge: 2012-08-23 | Disposition: A | Discharge status: Home / Self Care | Payer: BC Managed Care – PPO | Source: Ambulatory Visit | Attending: Internal Medicine | Admitting: Internal Medicine

## 2012-08-23 DIAGNOSIS — R609 Edema, unspecified: Secondary | ICD-10-CM

## 2012-09-13 ENCOUNTER — Ambulatory Visit: Payer: BC Managed Care – PPO | Admitting: Cardiology

## 2012-11-14 ENCOUNTER — Encounter (HOSPITAL_COMMUNITY): Payer: Self-pay | Admitting: *Deleted

## 2012-11-14 ENCOUNTER — Observation Stay (HOSPITAL_COMMUNITY)
Admission: EM | Admit: 2012-11-14 | Discharge: 2012-11-15 | Disposition: A | Discharge status: Home / Self Care | Payer: BC Managed Care – PPO | Attending: Internal Medicine | Admitting: Internal Medicine

## 2012-11-14 ENCOUNTER — Emergency Department (HOSPITAL_COMMUNITY): Payer: BC Managed Care – PPO

## 2012-11-14 DIAGNOSIS — N184 Chronic kidney disease, stage 4 (severe): Secondary | ICD-10-CM | POA: Diagnosis present

## 2012-11-14 DIAGNOSIS — N183 Chronic kidney disease, stage 3 unspecified: Secondary | ICD-10-CM

## 2012-11-14 DIAGNOSIS — Z9641 Presence of insulin pump (external) (internal): Secondary | ICD-10-CM | POA: Insufficient documentation

## 2012-11-14 DIAGNOSIS — I10 Essential (primary) hypertension: Secondary | ICD-10-CM | POA: Diagnosis present

## 2012-11-14 DIAGNOSIS — I249 Acute ischemic heart disease, unspecified: Secondary | ICD-10-CM

## 2012-11-14 DIAGNOSIS — I2510 Atherosclerotic heart disease of native coronary artery without angina pectoris: Secondary | ICD-10-CM

## 2012-11-14 DIAGNOSIS — R079 Chest pain, unspecified: Principal | ICD-10-CM

## 2012-11-14 DIAGNOSIS — I129 Hypertensive chronic kidney disease with stage 1 through stage 4 chronic kidney disease, or unspecified chronic kidney disease: Secondary | ICD-10-CM | POA: Insufficient documentation

## 2012-11-14 DIAGNOSIS — Z79899 Other long term (current) drug therapy: Secondary | ICD-10-CM | POA: Insufficient documentation

## 2012-11-14 DIAGNOSIS — E785 Hyperlipidemia, unspecified: Secondary | ICD-10-CM | POA: Diagnosis present

## 2012-11-14 DIAGNOSIS — E119 Type 2 diabetes mellitus without complications: Secondary | ICD-10-CM | POA: Diagnosis present

## 2012-11-14 HISTORY — DX: Calculus of kidney: N20.0

## 2012-11-14 HISTORY — DX: Other chronic pain: G89.29

## 2012-11-14 HISTORY — DX: Chest pain, unspecified: R07.9

## 2012-11-14 HISTORY — DX: Snoring: R06.83

## 2012-11-14 LAB — BASIC METABOLIC PANEL
BUN: 35 mg/dL — ABNORMAL HIGH (ref 6–23)
CO2: 25 mEq/L (ref 19–32)
Calcium: 8.6 mg/dL (ref 8.4–10.5)
Chloride: 105 mEq/L (ref 96–112)
Creatinine, Ser: 3.59 mg/dL — ABNORMAL HIGH (ref 0.50–1.35)
GFR calc Af Amer: 19 mL/min — ABNORMAL LOW (ref >90)
GFR calc non Af Amer: 17 mL/min — ABNORMAL LOW (ref >90)
Glucose, Bld: 123 mg/dL — ABNORMAL HIGH (ref 70–99)
Potassium: 3.8 mEq/L (ref 3.5–5.1)
Sodium: 140 mEq/L (ref 135–145)

## 2012-11-14 LAB — CREATININE, SERUM
Creatinine, Ser: 3.12 mg/dL — ABNORMAL HIGH (ref 0.50–1.35)
GFR calc Af Amer: 23 mL/min — ABNORMAL LOW (ref >90)
GFR calc non Af Amer: 20 mL/min — ABNORMAL LOW (ref >90)

## 2012-11-14 LAB — CBC
HCT: 33.5 % — ABNORMAL LOW (ref 39.0–52.0)
HCT: 35.5 % — ABNORMAL LOW (ref 39.0–52.0)
Hemoglobin: 11.1 g/dL — ABNORMAL LOW (ref 13.0–17.0)
Hemoglobin: 12.2 g/dL — ABNORMAL LOW (ref 13.0–17.0)
MCH: 28.5 pg (ref 26.0–34.0)
MCH: 29.3 pg (ref 26.0–34.0)
MCHC: 33.1 g/dL (ref 30.0–36.0)
MCHC: 34.4 g/dL (ref 30.0–36.0)
MCV: 85.9 fL (ref 78.0–100.0)
MCV: 85.1 fL (ref 78.0–100.0)
Platelets: 183 10*3/uL (ref 150–400)
Platelets: 182 10*3/uL (ref 150–400)
RBC: 3.9 MIL/uL — ABNORMAL LOW (ref 4.22–5.81)
RBC: 4.17 MIL/uL — ABNORMAL LOW (ref 4.22–5.81)
RDW: 13.1 % (ref 11.5–15.5)
RDW: 13.3 % (ref 11.5–15.5)
WBC: 5.7 10*3/uL (ref 4.0–10.5)
WBC: 6 10*3/uL (ref 4.0–10.5)

## 2012-11-14 LAB — POCT I-STAT TROPONIN I: Troponin i, poc: 0 ng/mL (ref 0.00–0.08)

## 2012-11-14 LAB — GLUCOSE, CAPILLARY
Glucose-Capillary: 70 mg/dL (ref 70–99)
Glucose-Capillary: 158 mg/dL — ABNORMAL HIGH (ref 70–99)

## 2012-11-14 LAB — TROPONIN I
Troponin I: <0.3 ng/mL (ref <0.30)
Troponin I: <0.3 ng/mL (ref <0.30)

## 2012-11-14 LAB — PROTIME-INR
INR: 0.94 (ref 0.00–1.49)
Prothrombin Time: 12.4 seconds (ref 11.6–15.2)

## 2012-11-14 LAB — TSH: TSH: 1.054 u[IU]/mL (ref 0.350–4.500)

## 2012-11-14 LAB — PRO B NATRIURETIC PEPTIDE: Pro B Natriuretic peptide (BNP): 685.6 pg/mL — ABNORMAL HIGH (ref 0–125)

## 2012-11-14 MED ORDER — ZOLPIDEM TARTRATE 5 MG PO TABS: 5.0000 mg | ORAL_TABLET | Freq: Every evening | ORAL | Status: DC | PRN

## 2012-11-14 MED ORDER — NITROGLYCERIN 0.4 MG SL SUBL: 0.4000 mg | SUBLINGUAL_TABLET | SUBLINGUAL | Status: DC | PRN

## 2012-11-14 MED ORDER — ASPIRIN 81 MG PO CHEW
CHEWABLE_TABLET | ORAL | Status: AC
  Filled 2012-11-14: qty 4

## 2012-11-14 MED ORDER — CARVEDILOL 25 MG PO TABS
25.0000 mg | ORAL_TABLET | Freq: Two times a day (BID) | ORAL | Status: DC
  Administered 2012-11-14 – 2012-11-15 (×3): 25 mg via ORAL
  Filled 2012-11-14 (×4): qty 1

## 2012-11-14 MED ORDER — GI COCKTAIL ~~LOC~~
30.0000 mL | Freq: Once | ORAL | Status: AC
  Administered 2012-11-14: 30 mL via ORAL
  Filled 2012-11-14: qty 30

## 2012-11-14 MED ORDER — SODIUM CHLORIDE 0.9 % IJ SOLN
3.0000 mL | Freq: Two times a day (BID) | INTRAMUSCULAR | Status: DC
  Administered 2012-11-15: 3 mL via INTRAVENOUS

## 2012-11-14 MED ORDER — AMLODIPINE BESYLATE 5 MG PO TABS
5.0000 mg | ORAL_TABLET | Freq: Every day | ORAL | Status: DC
  Administered 2012-11-14: 5 mg via ORAL
  Filled 2012-11-14 (×2): qty 1

## 2012-11-14 MED ORDER — SODIUM CHLORIDE 0.9 % IV SOLN: 250.0000 mL | INTRAVENOUS | Status: DC | PRN

## 2012-11-14 MED ORDER — NITROGLYCERIN 0.4 MG SL SUBL
SUBLINGUAL_TABLET | SUBLINGUAL | Status: AC
  Filled 2012-11-14: qty 25

## 2012-11-14 MED ORDER — PANTOPRAZOLE SODIUM 40 MG PO TBEC
40.0000 mg | DELAYED_RELEASE_TABLET | Freq: Every day | ORAL | Status: DC
  Administered 2012-11-14 – 2012-11-15 (×2): 40 mg via ORAL
  Filled 2012-11-14: qty 1

## 2012-11-14 MED ORDER — NITROGLYCERIN 0.4 MG SL SUBL
0.4000 mg | SUBLINGUAL_TABLET | SUBLINGUAL | Status: DC | PRN
  Administered 2012-11-14: 0.4 mg via SUBLINGUAL

## 2012-11-14 MED ORDER — INSULIN PUMP
Freq: Three times a day (TID) | SUBCUTANEOUS | Status: DC
  Administered 2012-11-14 – 2012-11-15 (×5): via SUBCUTANEOUS
  Filled 2012-11-14: qty 1

## 2012-11-14 MED ORDER — HEPARIN (PORCINE) IN NACL 100-0.45 UNIT/ML-% IJ SOLN
1000.0000 [IU]/h | INTRAMUSCULAR | Status: DC
  Administered 2012-11-14: 1000 [IU]/h via INTRAVENOUS
  Filled 2012-11-14: qty 250

## 2012-11-14 MED ORDER — TAMSULOSIN HCL 0.4 MG PO CAPS
0.4000 mg | ORAL_CAPSULE | Freq: Every day | ORAL | Status: DC
  Administered 2012-11-15: 0.4 mg via ORAL
  Filled 2012-11-14: qty 1

## 2012-11-14 MED ORDER — ASPIRIN EC 81 MG PO TBEC
81.0000 mg | DELAYED_RELEASE_TABLET | Freq: Every day | ORAL | Status: DC
Start: 2012-11-14 — End: 2012-11-15
  Administered 2012-11-14 – 2012-11-15 (×2): 81 mg via ORAL
  Filled 2012-11-14 (×2): qty 1

## 2012-11-14 MED ORDER — HEPARIN (PORCINE) IN NACL 100-0.45 UNIT/ML-% IJ SOLN
1000.0000 [IU]/h | INTRAMUSCULAR | Status: DC
  Filled 2012-11-14: qty 250

## 2012-11-14 MED ORDER — HYDRALAZINE HCL 20 MG/ML IJ SOLN: 10.0000 mg | INTRAMUSCULAR | Status: DC | PRN

## 2012-11-14 MED ORDER — NITROGLYCERIN 0.4 MG SL SUBL
0.4000 mg | SUBLINGUAL_TABLET | SUBLINGUAL | Status: DC | PRN
Start: 2012-11-14 — End: 2012-11-15
  Filled 2012-11-14: qty 25

## 2012-11-14 MED ORDER — ONDANSETRON HCL 4 MG/2ML IJ SOLN: 4.0000 mg | Freq: Four times a day (QID) | INTRAMUSCULAR | Status: DC | PRN

## 2012-11-14 MED ORDER — ISOSORBIDE MONONITRATE ER 30 MG PO TB24
30.0000 mg | ORAL_TABLET | Freq: Every day | ORAL | Status: DC
  Administered 2012-11-14 – 2012-11-15 (×2): 30 mg via ORAL
  Filled 2012-11-14 (×2): qty 1

## 2012-11-14 MED ORDER — HEPARIN SODIUM (PORCINE) 5000 UNIT/ML IJ SOLN: 60.0000 [IU]/kg | Freq: Once | INTRAMUSCULAR | Status: DC

## 2012-11-14 MED ORDER — HEPARIN BOLUS VIA INFUSION
4000.0000 [IU] | Freq: Once | INTRAVENOUS | Status: AC
  Administered 2012-11-14: 4000 [IU] via INTRAVENOUS

## 2012-11-14 MED ORDER — MORPHINE SULFATE 2 MG/ML IJ SOLN: 2.0000 mg | INTRAMUSCULAR | Status: DC | PRN

## 2012-11-14 MED ORDER — ATORVASTATIN CALCIUM 80 MG PO TABS
80.0000 mg | ORAL_TABLET | Freq: Every day | ORAL | Status: DC
  Administered 2012-11-14 – 2012-11-15 (×2): 80 mg via ORAL
  Filled 2012-11-14 (×2): qty 1

## 2012-11-14 MED ORDER — TRAMADOL HCL 50 MG PO TABS
50.0000 mg | ORAL_TABLET | Freq: Four times a day (QID) | ORAL | Status: DC | PRN
  Administered 2012-11-14: 50 mg via ORAL
  Filled 2012-11-14: qty 1

## 2012-11-14 MED ORDER — ASPIRIN 81 MG PO CHEW: 243.0000 mg | CHEWABLE_TABLET | Freq: Once | ORAL | Status: DC

## 2012-11-14 MED ORDER — SODIUM CHLORIDE 0.9 % IV SOLN
Freq: Once | INTRAVENOUS | Status: AC
  Administered 2012-11-14: 12:00:00 via INTRAVENOUS

## 2012-11-14 MED ORDER — HEPARIN (PORCINE) IN NACL 100-0.45 UNIT/ML-% IJ SOLN: 12.0000 [IU]/kg/h | INTRAMUSCULAR | Status: DC

## 2012-11-14 MED ORDER — HEPARIN SODIUM (PORCINE) 5000 UNIT/ML IJ SOLN
5000.0000 [IU] | Freq: Three times a day (TID) | INTRAMUSCULAR | Status: DC
  Administered 2012-11-14 – 2012-11-15 (×3): 5000 [IU] via SUBCUTANEOUS
  Filled 2012-11-14 (×5): qty 1

## 2012-11-14 MED ORDER — ACETAMINOPHEN 325 MG PO TABS: 650.0000 mg | ORAL_TABLET | ORAL | Status: DC | PRN

## 2012-11-14 MED ORDER — ALPRAZOLAM 0.25 MG PO TABS: 0.2500 mg | ORAL_TABLET | Freq: Two times a day (BID) | ORAL | Status: DC | PRN

## 2012-11-14 MED ORDER — HEPARIN BOLUS VIA INFUSION: 4000.0000 [IU] | Freq: Once | INTRAVENOUS | Status: DC

## 2012-11-14 MED ORDER — CLONIDINE HCL 0.3 MG PO TABS
0.3000 mg | ORAL_TABLET | Freq: Two times a day (BID) | ORAL | Status: DC
  Administered 2012-11-14 – 2012-11-15 (×3): 0.3 mg via ORAL
  Filled 2012-11-14 (×4): qty 1

## 2012-11-14 MED ORDER — SODIUM CHLORIDE 0.9 % IJ SOLN: 3.0000 mL | INTRAMUSCULAR | Status: DC | PRN

## 2012-11-14 MED ORDER — ASPIRIN 81 MG PO CHEW
324.0000 mg | CHEWABLE_TABLET | Freq: Once | ORAL | Status: AC
  Administered 2012-11-14: 324 mg via ORAL

## 2012-11-14 NOTE — ED Notes (Signed)
Jon Anderson receiving md, charge at [facility] made aware. emtala signed

## 2012-11-14 NOTE — ED Provider Notes (Signed)
I saw and evaluated the patient, reviewed the resident's note and I agree with the findings and plan.   .Face to face Exam:  General:  Awake HEENT:  Atraumatic Resp:  Normal effort Abd:  Nondistended Neuro:No focal weakness   CRITICAL CARE Performed by: Leonard Schwartz L Total critical care time: 30 min Critical care time was exclusive of separately billable procedures and treating other patients. Critical care was necessary to treat or prevent imminent or life-threatening deterioration. Critical care was time spent personally by me on the following activities: development of treatment plan with patient and/or surrogate as well as nursing, discussions with consultants, evaluation of patient's response to treatment, examination of patient, obtaining history from patient or surrogate, ordering and performing treatments and interventions, ordering and review of laboratory studies, ordering and review of radiographic studies, pulse oximetry and re-evaluation of patient's condition.   Dot Lanes, MD 11/14/12 1158

## 2012-11-14 NOTE — ED Notes (Signed)
ems at bedside, transporting pt

## 2012-11-14 NOTE — ED Notes (Signed)
Pt reports L side cp which started x 2 weeks ago.  Pt reports SOB with the cp.  Denies any dizziness at this time.  Pt reports pain is non-radiating.  Pt reports he waited this long because he was hoping it was nothing.  Pt has had same pain in the past and states that all the tests done was WNL.

## 2012-11-14 NOTE — H&P (Addendum)
Patient ID: Jon Anderson MRN: [id number], DOB/AGE: [birth year]   Admit date: 11/14/2012  Primary Physician: [name], MD Primary Cardiologist: J. Hochrein, MD   Pt. Profile:  62 y/o male with h/o nonobstructive CAD and somewhat chronic chest pain who presented on tx from the [facility] ED 2/2 recurrent chest pain.  Problem List  Past Medical History  Diagnosis Date  . Diabetes mellitus   . Hyperlipidemia   . HTN (hypertension)     echo 3/10: EF 60%, LAE  . CAD (coronary artery disease)     a. cath 3/10: oLAD 25%, mLAD 25%; CFX 25%; oDx 80% (small - tx. medically); pRCA 25%, mRCA 25%; OM3 20%;  b.  Myoview 4/11: EF 53%, no scar or ischemia   c. MV 2012 Nl perfusion, apical thinning.  No ischemia or scar.  EF 49%, appears greater by visual estimate.;  d.  Dob stress echo 12/13:  Negative Dob stress echo. There is no evidence of ischemia.  The LVF is normal.  . Chronic chest pain   . GERD (gastroesophageal reflux disease)   . Nephrolithiasis   . CKD (chronic kidney disease), stage IV     a. 05/2012 Renal u/s: medical renal dzs;  b. seen by [facility]  . Carotid stenosis     a. dopplers 05/2012:  [id number] RICA; [id number] LICA => repeat in 6 mos  . Snores     a. presumed OSA, pt has refused sleep eval in past.    Past Surgical History  Procedure Laterality Date  . Tonsillectomy    . Adenoidectomy    . Cardiac catheterization  2001 and 2010     Allergies  Allergies  Allergen Reactions  . Kiwi Extract Swelling   HPI  62 y/o male with a long h/o chest pain dating back at least to 2010 @ which time he underwent diagnostic cath revealing nonobstructive coronary artery disease. He has since undergone stress testing in 2011, 2012, and most recently December 2013, all of which were normal. He was last seen in our clinic in February of this year and was doing better after being placed on Nexium therapy. Since his last clinic visit with [place], he is continued to have intermittent rest and  exertional left-sided chest discomfort associated with mild dyspnea, occurring 2-3 days per week, lasting hours at a time, and resolving spontaneously. 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 In June of this year, his 42 year old mother succumbed to cancer and since then he has been experiencing more frequent episodes of chest pressure. Over the past few weeks, symptoms have been occurring on a daily basis. Again, symptoms might last several hours and resolve spontaneously. Symptoms have not been limiting activity. He had recurrent chest discomfort this morning and mentioned it to his wife and decision was made to present to the [facility] long emergency department. There, there was some concern for anterior ST segment elevation and a code STEMI was initially called however after review of old ECGs, it was noted that his current ECG is not acutely changed. BP was 233/110.  Decision was made to transfer him to [facility] cone for further cardiology evaluation. He currently rates his discomfort at a 2-3/10. He is hemodynamically stable. His creatinine is elevated at 3.59, which is above the last recording and we have are on record from December 2011, which was 2.63. He did see [facility] sometime this spring. A renal  ultrasound performed in February showed medical renal disease.  Home Medications  Prior to Admission medications   Medication Sig Start Date End Date Taking? Authorizing Provider  aspirin EC 81 MG tablet Take 1 tablet (81 mg total) by mouth daily. 05/28/12  Yes Scott T Kathlen Mody, PA-C  carvedilol (COREG) 25 MG tablet Take 25 mg by mouth 2 (two) times daily with a meal.   Yes Historical Provider, MD  cloNIDine (CATAPRES) 0.3 MG tablet Take 1 tablet (0.3 mg total) by mouth 2 (two) times daily. 03/20/12  Yes Rhonda G Barrett, PA-C  esomeprazole (NEXIUM) 40 MG capsule Take 1 capsule (40 mg total) by mouth daily before breakfast.  03/20/12  Yes Rhonda G Barrett, PA-C  insulin aspart (NOVOLOG) 100 UNIT/ML injection Inject into the skin continuous. Per insulin pump 03/20/12  Yes Rhonda G Barrett, PA-C  nitroGLYCERIN (NITROSTAT) 0.4 MG SL tablet Place 1 tablet (0.4 mg total) under the tongue every 5 (five) minutes as needed for chest pain. 03/20/12  Yes Rhonda G Barrett, PA-C  rosuvastatin (CRESTOR) 40 MG tablet Take 1 tablet (40 mg total) by mouth daily. 03/20/12  Yes Rhonda G Barrett, PA-C  Tamsulosin HCl (FLOMAX) 0.4 MG CAPS Take 1 capsule (0.4 mg total) by mouth daily. 03/20/12  Yes Rhonda G Barrett, PA-C   Insulin Pump  Family History  Family History  Problem Relation Age of Onset  . Heart attack Sister     died @ 45  . Cancer Mother     died @ 18  . Diabetes Brother     deceased   Social History  History   Social History  . Marital Status: Married    Spouse Name: N/A    Number of Children: N/A  . Years of Education: N/A   Occupational History  . Not on file.   Social History Main Topics  . Smoking status: Former Research scientist (life sciences)  . Smokeless tobacco: Not on file  . Alcohol Use: No  . Drug Use: No  . Sexually Active: Yes    Birth Control/ Protection: None   Other Topics Concern  . Not on file   Social History Narrative   The patient is a Retail buyer.  He is married and has 2 grown children.  Lives in [place] with his wife.  Denies tobacco, alcohol or IV drug abuse or marijuana or cocaine intake.     Review of Systems General:  No chills, fever, night sweats or weight changes.  Cardiovascular:  +++ chest pain associated with mild dyspnea as outlined above.  No edema, orthopnea, palpitations, paroxysmal nocturnal dyspnea. Dermatological: No rash, lesions/masses Respiratory: No cough, +++ dyspnea Urologic: No hematuria, dysuria Abdominal:   No nausea, vomiting, diarrhea, bright red blood per rectum, melena, or hematemesis Neurologic:  No visual changes, wkns, changes in mental status. All other systems  reviewed and are otherwise negative except as noted above.  Physical Exam  Blood pressure 173/96, pulse 52, temperature 98.8 F (37.1 C), temperature source Oral, resp. rate 19, height 5' 10.87" (1.8 m), weight 194 lb (87.998 kg), SpO2 100.00%.  General: Pleasant, NAD Psych: Normal affect. Neuro: Alert and oriented X 3. Moves all extremities spontaneously. HEENT: Normal  Neck: Supple without bruits or JVD. Lungs:  Resp regular and unlabored, CTA. Heart: RRR no s3, s4, or murmurs. Abdomen: Soft, non-tender, non-distended, BS + x 4.  Extremities: No clubbing, cyanosis or edema. DP/PT/Radials 2+ and equal bilaterally.  Labs  Trop i, poc: 0.00  No results found for this basename:  CKTOTAL, CKMB, TROPONINI,  in the last 72 hours Lab Results  Component Value Date   WBC 5.7 11/14/2012   HGB 11.1* 11/14/2012   HCT 33.5* 11/14/2012   MCV 85.9 11/14/2012   PLT 183 11/14/2012     Recent Labs Lab 11/14/12 1135  NA 140  K 3.8  CL 105  CO2 25  BUN 35*  CREATININE 3.59*  CALCIUM 8.6  GLUCOSE 123*   Lab Results  Component Value Date   CHOL 237* 03/20/2012   HDL 51 03/20/2012   LDLCALC 164* 03/20/2012   TRIG 110 03/20/2012    Radiology/Studies  Dg Chest Port 1 View  11/14/2012   *RADIOLOGY REPORT*  Clinical Data: Chest pain and short of breath.  Atrial fibrillation  PORTABLE CHEST - 1 VIEW  Comparison: 03/19/2012  Findings: Heart size is upper normal.  Negative for heart failure. The lungs are clear without infiltrate effusion or mass.  IMPRESSION: No active cardiopulmonary abnormality.   Original Report Authenticated By: Carl Best, M.D.   ECG  Sb, 82, no acute st/t changes.  ASSESSMENT AND PLAN  1.  Acute on chronic chest pain:  Pt has a long h/o somewhat atypical chest pain with nonobstructive CAD on cath in 2010 and negative stress tests annually, since.  Last stress test was a dobutamine echo in 03/2012.  He has had rest and exertional chest pain a few x/wk up until about 2 mos  ago, following the death of his mother, when he began to experience c/p on an almost daly basis.  Pain this AM has now persisted about 5 hours and initial troponin is nl.  ECG is non-acute.  BP is poorly controlled.  We will observe, r/o, and work on BP control.  If CE remain neg, would not pursue further ischemic evaluation at this time, esp in light of abnl creatinine.  Cont asa, bb, statin, PPI.  Add long-acting nitrate.  2.  Acute on chronic stage III-IV CKD:  Creat 3.59, up from 2.63 in 03/2012.  He has seen [facility] before.  BP is elevated.  We will adjust BP meds. He will need further outpt f/u with [facility].  If troponin's turn +, we will ask for inpt consult, as he would then require cath.  3.  HTN:  Poorly controlled.  Adjust home meds as needed.  Will add amlodipine.  4.  HL:  On crestor @ home.  Last LDL 164 in 03/2012.  F/u lipids/lft's.  5.  DM:  On insulin pump.  6.  ? OSA: pt snores and has been advised to have sleep eval in the past.  If he is agreeable, we can arrange pulm/sleep clinic f/u @ d/c.  Signed, Murray Hodgkins, NP 11/14/2012, 2:03 PM  Patient seen and examined.  I agree with findings as noted by Angelica Ran above.  Patient with CP  He says it is similar to pain he had back in Dec 2013.  On exam, BP is markedly elevated.  Lungs CTA  Cardiac:  RRR  No S3  Ext:  No signif edema.  Labs signif for normal trop despite continued discomfort  Cr greater than 3.5 which is up form December  EKG without acute changes. Plan is to improve BP control  Patient with normal LV funciton.  WIll add amlodipine  Stressed with him the importance of good BP control for heart and kidney function.    If  R/o for MI will not pursue further ischemic testing now.  Patient needs f/u in renal clinic.    Dorris Carnes 11/14/2012

## 2012-11-14 NOTE — ED Notes (Signed)
rn on phone with pharmacy. Pt is being given 4000unit bolus. 1000 units/ hr

## 2012-11-14 NOTE — ED Provider Notes (Addendum)
MSE was initiated and I personally evaluated the patient and placed orders (if any) at  1:16 PM on November 14, 2012.  The patient appears stable.   He was transferred from [facility] due to chest pain. He is to be seen by Dr. Ferman Hamming cards.  Pt has had labs drawn, started on heparin.  Code stemi was activated then cancelled at [facility] due to similar appearance of EKG in the past.  Pt reports no chest pain now in the [facility] ED.  He is awake, alert, no increased respiratory effort.  Skin warm and dry.     Date: 11/14/2012  Rate: 56  Rhythm: sinus bradycardia  QRS Axis: normal  Intervals: normal  ST/T Wave abnormalities: diffuse t wave flattening, prominent ST waves in anterior leads  Conduction Disutrbances:none  Narrative Interpretation:   Old EKG Reviewed: unchanged compared to ekg of 05/14/12   Threasa Beards, MD 11/14/12 [place], MD 11/14/12 1400

## 2012-11-14 NOTE — ED Notes (Signed)
Dr. Burt Knack informed of pt arrival to ED.

## 2012-11-14 NOTE — Progress Notes (Signed)
ANTICOAGULATION CONSULT NOTE - Initial Consult  Pharmacy Consult for Heparin Indication: STEMI  Allergies  Allergen Reactions  . Kiwi Extract Swelling   Patient Measurements: Height: 5' 10.87" (180 cm) Weight: 194 lb (87.998 kg) IBW/kg (Calculated) : 74.99 Heparin Dosing Weight: 88kg  Vital Signs: Temp: 98.8 F (37.1 C) (08/07 1128) Temp src: Oral (08/07 1128) BP: 167/112 mmHg (08/07 1150) Pulse Rate: 71 (08/07 1150)  Labs:  Recent Labs  11/14/12 1135 11/14/12 1138  HGB 11.1*  --   HCT 33.5*  --   PLT 183  --   LABPROT  --  12.4  INR  --  0.94  CREATININE 3.59*  --    Estimated Creatinine Clearance: 22.6 ml/min (by C-G formula based on Cr of 3.59).  Medical History: Past Medical History  Diagnosis Date  . Diabetes mellitus   . Hyperlipidemia   . HTN (hypertension)     echo 3/10: EF 60%, LAE  . CAD (coronary artery disease)     a.  cath 3/10: oLAD 25%, mLAD 25%; CFX 25%; oDx 80% (small - tx. medically); pRCA 25%, mRCA 25%; OM3 20%;     b.  Myoview 4/11: EF 53%, no scar or ischemia   c. MV 2012 Nl perfusion, apical thinning.  No ischemia or scar.  EF 49%, appears greater by visual estimate.;  d.  Dob stress echo 12/13:  Negative Dob stress echo. There is no evidence of ischemia.  The LVF is normal.  . Anginal pain   . GERD (gastroesophageal reflux disease)   . Kidney stone   . Chronic kidney disease (CKD), stage III (moderate)   . Carotid stenosis     a. dopplers 05/2012:  [id number] RICA; [id number] LICA => repeat in 6 mos   Medications:  Scheduled:   Infusions:  . heparin 1,000 Units/hr (11/14/12 1201)    Assessment: 27 yoM with intermittent chest pain x 2 wk, left-sided chest pain worsened this am at work. ASA 81mg  only taken by pt this am, no NTG. To ED: EKG -> STEMI  Hx of CAD, HTN, HLD, CKD, DM (insulin pump noted by [facility])  Heparin 4000 unit bolus, infusion at 1000 units/hr  Transfer to [facility] for cath  SCr 3.59, Clearance ~ 21 ml/min (normalized)    Goal of Therapy:  Heparin level 0.3-0.7 units/ml Monitor platelets by anticoagulation protocol: Yes   Plan:  Cath at [facility] emergently No follow-up Heparin levels, CBC ordered yet.  Minda Ditto PharmD Pager [phone] 11/14/2012, 12:22 PM

## 2012-11-14 NOTE — ED Provider Notes (Signed)
CSN: [id number]     Arrival date & time 11/14/12  1124 History     First MD Initiated Contact with Patient 11/14/12 1130     Chief Complaint  Patient presents with  . Chest Pain   (Consider location/radiation/quality/duration/timing/severity/associated sxs/prior Treatment) HPI 62 year old male with history of CAD, HTN, HLD, and CKD presents with chest pain.  He reports that he has had chest pain intermittently for the past 2 weeks.  This am at approximately 8 am he had worsening severe, left sided chest pain while at work.  Pain non-radiating and described as "pressure."  No nitro taken. He did take 81 mg ASA this am.    EKG obtained and revealed anterior V1 and V2 STEMI. Code STEMI called.   Past Medical History  Diagnosis Date  . Diabetes mellitus   . Hyperlipidemia   . HTN (hypertension)     echo 3/10: EF 60%, LAE  . CAD (coronary artery disease)     a.  cath 3/10: oLAD 25%, mLAD 25%; CFX 25%; oDx 80% (small - tx. medically); pRCA 25%, mRCA 25%; OM3 20%;     b.  Myoview 4/11: EF 53%, no scar or ischemia   c. MV 2012 Nl perfusion, apical thinning.  No ischemia or scar.  EF 49%, appears greater by visual estimate.;  d.  Dob stress echo 12/13:  Negative Dob stress echo. There is no evidence of ischemia.  The LVF is normal.  . Anginal pain   . GERD (gastroesophageal reflux disease)   . Kidney stone   . Chronic kidney disease (CKD), stage III (moderate)   . Carotid stenosis     a. dopplers 05/2012:  [id number] RICA; [id number] LICA => repeat in 6 mos   Past Surgical History  Procedure Laterality Date  . Tonsillectomy    . Adenoidectomy    . Cardiac catheterization  2001 and 2010    Family History  Problem Relation Age of Onset  . Hypotension    . Coronary artery disease    . Heart attack    . Diabetes     History  Substance Use Topics  . Smoking status: Former Research scientist (life sciences)  . Smokeless tobacco: Not on file  . Alcohol Use: No    Review of Systems  Constitutional: Negative for fever,  chills and diaphoresis.  Respiratory: Positive for chest tightness. Negative for shortness of breath.   Cardiovascular: Positive for chest pain.  Gastrointestinal: Negative for nausea and vomiting.     Allergies  Kiwi extract  Home Medications   Current Outpatient Rx  Name  Route  Sig  Dispense  Refill  . amLODipine (NORVASC) 10 MG tablet   Oral   Take 1 tablet (10 mg total) by mouth daily.   30 tablet   11   . EXPIRED: aspirin EC 81 MG EC tablet   Oral   Take 1 tablet (81 mg total) by mouth daily.   150 tablet   2   . aspirin EC 81 MG tablet   Oral   Take 1 tablet (81 mg total) by mouth daily.         . carvedilol (COREG) 25 MG tablet   Oral   Take 25 mg by mouth 2 (two) times daily with a meal.         . cloNIDine (CATAPRES) 0.3 MG tablet   Oral   Take 1 tablet (0.3 mg total) by mouth 2 (two) times daily.   30 tablet  11   . esomeprazole (NEXIUM) 40 MG capsule   Oral   Take 1 capsule (40 mg total) by mouth daily before breakfast.   30 capsule   3   . HYDROcodone-acetaminophen (NORCO/VICODIN) 5-325 MG per tablet   Oral   Take 1-2 tablets by mouth every 4 (four) hours as needed.   30 tablet   0   . insulin aspart (NOVOLOG) 100 UNIT/ML injection   Subcutaneous   Inject 10-20 Units into the skin 3 (three) times daily before meals. Sliding scale - use 1/2 as much for the evening dose.   1 vial   1   . metFORMIN (GLUMETZA) 1000 MG (MOD) 24 hr tablet   Oral   Take 1 tablet (1,000 mg total) by mouth 2 (two) times daily with a meal.   60 tablet   1   . metoCLOPramide (REGLAN) 10 MG tablet      TAKE ONE TABLET BY MOUTH TWICE DAILY   60 tablet   0     Must refill thru PCP for further refills per Provi ...   . nitroGLYCERIN (NITROSTAT) 0.4 MG SL tablet   Sublingual   Place 1 tablet (0.4 mg total) under the tongue every 5 (five) minutes as needed for chest pain.   25 tablet   3   . rosuvastatin (CRESTOR) 40 MG tablet   Oral   Take 1 tablet (40  mg total) by mouth daily.   30 tablet   11   . Tamsulosin HCl (FLOMAX) 0.4 MG CAPS   Oral   Take 1 capsule (0.4 mg total) by mouth daily.   30 capsule   1    BP 207/104  Pulse 61  Temp(Src) 98.8 F (37.1 C) (Oral)  Resp 20  SpO2 97% Physical Exam  Constitutional: He is oriented to person, place, and time. He appears well-developed and well-nourished. No distress.  HENT:  Head: Normocephalic and atraumatic.  Cardiovascular: Normal rate and regular rhythm.   No murmur heard. Pulmonary/Chest: Effort normal and breath sounds normal. He has no wheezes. He has no rales.  Abdominal: Soft. Bowel sounds are normal. He exhibits no distension. There is no tenderness.  Musculoskeletal: He exhibits no edema.  Neurological: He is alert and oriented to person, place, and time.  Skin: He is not diaphoretic.   ED Course   Procedures (including critical care time)   Date: 11/14/2012  Rate:64  Rhythm: normal sinus rhythm  QRS Axis: normal  Intervals: normal  ST/T Wave abnormalities: ST elevations anteriorly; V1, V2  Conduction Disutrbances:none  Old EKG Reviewed: changes noted  Labs Reviewed  CBC - Abnormal; Notable for the following:    RBC 3.90 (*)    Hemoglobin 11.1 (*)    HCT 33.5 (*)    All other components within normal limits  BASIC METABOLIC PANEL   No results found. 1. Chest pain    MDM  62 year old male presents with chest pain.  EKG concerning for STEMI.  CODE STEMI called. - Patient given ASA and started on Heparin.  Chest xray and basic labs obtained. - Attending, Dr. Audie Pinto discussed case with Dr. Burt Knack [facility] cardiology).  Prior EKG that he revealed showed prior ST elevation.  Code STEMI cancelled.  He is accepting the patient and patient to be transferred to [facility] ED.   Coral Spikes, DO 11/14/12 1156

## 2012-11-15 DIAGNOSIS — R079 Chest pain, unspecified: Secondary | ICD-10-CM | POA: Diagnosis present

## 2012-11-15 DIAGNOSIS — I10 Essential (primary) hypertension: Secondary | ICD-10-CM

## 2012-11-15 LAB — COMPREHENSIVE METABOLIC PANEL
ALT: 16 U/L (ref 0–53)
AST: 16 U/L (ref 0–37)
Albumin: 3.2 g/dL — ABNORMAL LOW (ref 3.5–5.2)
Alkaline Phosphatase: 96 U/L (ref 39–117)
BUN: 36 mg/dL — ABNORMAL HIGH (ref 6–23)
CO2: 25 mEq/L (ref 19–32)
Calcium: 8.6 mg/dL (ref 8.4–10.5)
Chloride: 103 mEq/L (ref 96–112)
Creatinine, Ser: 3.36 mg/dL — ABNORMAL HIGH (ref 0.50–1.35)
GFR calc Af Amer: 21 mL/min — ABNORMAL LOW (ref >90)
GFR calc non Af Amer: 18 mL/min — ABNORMAL LOW (ref >90)
Glucose, Bld: 119 mg/dL — ABNORMAL HIGH (ref 70–99)
Potassium: 3.9 mEq/L (ref 3.5–5.1)
Sodium: 138 mEq/L (ref 135–145)
Total Bilirubin: 0.4 mg/dL (ref 0.3–1.2)
Total Protein: 6.4 g/dL (ref 6.0–8.3)

## 2012-11-15 LAB — LIPID PANEL
Cholesterol: 103 mg/dL (ref 0–200)
HDL: 44 mg/dL (ref >39)
LDL Cholesterol: 46 mg/dL (ref 0–99)
Total CHOL/HDL Ratio: 2.3 RATIO
Triglycerides: 67 mg/dL (ref <150)
VLDL: 13 mg/dL (ref 0–40)

## 2012-11-15 LAB — GLUCOSE, CAPILLARY
Glucose-Capillary: 65 mg/dL — ABNORMAL LOW (ref 70–99)
Glucose-Capillary: 127 mg/dL — ABNORMAL HIGH (ref 70–99)
Glucose-Capillary: 182 mg/dL — ABNORMAL HIGH (ref 70–99)
Glucose-Capillary: 128 mg/dL — ABNORMAL HIGH (ref 70–99)

## 2012-11-15 LAB — TROPONIN I: Troponin I: <0.3 ng/mL

## 2012-11-15 MED ORDER — AMLODIPINE BESYLATE 2.5 MG PO TABS
2.5000 mg | ORAL_TABLET | Freq: Every day | ORAL | Status: DC
  Administered 2012-11-15: 2.5 mg via ORAL
  Filled 2012-11-15: qty 1

## 2012-11-15 MED ORDER — AMLODIPINE BESYLATE 2.5 MG PO TABS: 2.5000 mg | ORAL_TABLET | Freq: Every day | ORAL | Status: DC

## 2012-11-15 NOTE — Progress Notes (Signed)
Patient was vital signs assessed prior to ambulation and post ambulation (see vital signs in flowsheet)  Ambulated to full length of halllway and back to room.  No sob, no distress, vs wnl.  Patient hoping for d/c later this afternoon.

## 2012-11-15 NOTE — Progress Notes (Signed)
TELEMETRY: Reviewed telemetry pt in NSR.  Filed Vitals:   11/14/12 1751 11/14/12 1838 11/14/12 2100 11/15/12 0405  BP: 182/80 182/83 154/78 115/67  Pulse:  58 59 60  Temp:  98.1 F (36.7 C) 98.1 F (36.7 C) 97.3 F (36.3 C)  TempSrc:  Oral Oral Oral  Resp:  18 18 18   Height:      Weight:    184 lb 12.8 oz (83.825 kg)  SpO2:  100% 99% 95%    Intake/Output Summary (Last 24 hours) at 11/15/12 0818 Last data filed at 11/14/12 2055  Gross per 24 hour  Intake    240 ml  Output    775 ml  Net   -535 ml    SUBJECTIVE Denies any further chest pain or SOB. Feels well.  LABS: Basic Metabolic Panel:  Recent Labs  11/14/12 1135 11/14/12 1744 11/15/12 0425  NA 140  --  138  K 3.8  --  3.9  CL 105  --  103  CO2 25  --  25  GLUCOSE 123*  --  119*  BUN 35*  --  36*  CREATININE 3.59* 3.12* 3.36*  CALCIUM 8.6  --  8.6   Liver Function Tests:  Recent Labs  11/15/12 0425  AST 16  ALT 16  ALKPHOS 96  BILITOT 0.4  PROT 6.4  ALBUMIN 3.2*   No results found for this basename: LIPASE, AMYLASE,  in the last 72 hours CBC:  Recent Labs  11/14/12 1135 11/14/12 1744  WBC 5.7 6.0  HGB 11.1* 12.2*  HCT 33.5* 35.5*  MCV 85.9 85.1  PLT 183 182   Cardiac Enzymes:  Recent Labs  11/14/12 1743 11/14/12 2145 11/15/12 0425  TROPONINI <0.30 <0.30 <0.30   Fasting Lipid Panel:  Recent Labs  11/15/12 0425  CHOL 103  HDL 44  LDLCALC 46  TRIG 67  CHOLHDL 2.3   Thyroid Function Tests:  Recent Labs  11/14/12 1744  TSH 1.054    Radiology/Studies:  Dg Chest Port 1 View  11/14/2012   *RADIOLOGY REPORT*  Clinical Data: Chest pain and short of breath.  Atrial fibrillation  PORTABLE CHEST - 1 VIEW  Comparison: 03/19/2012  Findings: Heart size is upper normal.  Negative for heart failure. The lungs are clear without infiltrate effusion or mass.  IMPRESSION: No active cardiopulmonary abnormality.   Original Report Authenticated By: Carl Best, M.D.   Ecg pending this  am.  PHYSICAL EXAM General: Well developed, well nourished, in no acute distress. Head: Normal Neck: Negative for carotid bruits. JVD not elevated. Lungs: Clear bilaterally to auscultation without wheezes, rales, or rhonchi. Breathing is unlabored. Heart: RRR S1 S2 without murmurs, rubs, or gallops.  Abdomen: Soft, non-tender, non-distended with normoactive bowel sounds. No hepatomegaly. No rebound/guarding. No obvious abdominal masses. Msk:  Strength and tone appears normal for age. Extremities: No clubbing, cyanosis or edema.  Distal pedal pulses are 2+ and equal bilaterally. Neuro: Alert and oriented X 3. Moves all extremities spontaneously. Psych:  Responds to questions appropriately with a normal affect.  ASSESSMENT AND PLAN: 1. Acute chest pain. Resolved. Patient has chronic low grade chest pain. Nonobstructive CAD by cath in 2010. Multiple stress tests since then have been normal. Patient has ruled out for MI. Recommend medical therapy. No further ischemic workup at this time. 2. HTN- BP down significantly this am with addition of nitrates and amlodipine. Patient reports history of swelling on amlodipine before. Will try lower dose of 2.5 mg.  3.  CKD stage 3-4. 4. Hyperlipidemia. On high dose Crestor at home. 5. DM on insulin pump. 6. Possible sleep apnea. Consider sleep study as outpatient.  Plan: will ambulate today and monitor BP. If stable could DC home later today.  Principal Problem:   Acute chest pain Active Problems:   HYPERLIPIDEMIA   Hypertension   CAD (coronary artery disease)   Chronic kidney disease (CKD), stage III (moderate)    Signed, Karsin Pesta [place] MD,FACC 11/15/2012 8:18 AM

## 2012-11-15 NOTE — Progress Notes (Signed)
Patient's d/c instructions reviewed with patient.  Patient verbalized understanding.  VS WNL.  IV d/c x 2.  Tele d/c.  Awaiting for daughter to pick him up for d/c home.

## 2012-11-15 NOTE — Progress Notes (Signed)
Inpatient Diabetes Program Recommendations  AACE/ADA: New Consensus Statement on Inpatient Glycemic Control (2013)  Target Ranges:  Prepandial:   less than 140 mg/dL      Peak postprandial:   less than 180 mg/dL (1-2 hours)      Critically ill patients:  140 - 180 mg/dL     Results for RENELL, MAIDONADO (MRN [id number]) as of 11/15/2012 14:09  Ref. Range 11/15/2012 02:04 11/15/2012 08:10 11/15/2012 11:37  Glucose-Capillary Latest Range: 70-99 mg/dL 65 (L) 127 (H) 182 (H)    **Admitted with CP.  Patient has history of DM and uses insulin pump at home to control CBGs.  Patient is A&Ox3 and able to independently manage insulin pump here in hospital.  Meets criteria for appropriateness to use insulin pump in hospital per insulin pump policy.  **Spoke with patient about his insulin pump.  Per patient his rates are as follows:  Basals: 0.75 units/hr (24 hours per day)= total of 18 units basal per 24 hour period Carbohydrate coverage= 1 unit Novolog for every 1 carbohydrate exchange Correction factor= 1 unit Novolog for every 50 mg/dl above target CBG Target CBG= 125 mg/dl  **Patient hopeful he will be able to go home today.  If he does not go home, he will need to change his insulin pump set/site/reservoir tomorrow (08/09)   Will follow. Wyn Quaker RN, MSN, CDE Diabetes Coordinator Inpatient Diabetes Program [phone]

## 2012-11-15 NOTE — Discharge Summary (Signed)
CARDIOLOGY DISCHARGE SUMMARY   Patient ID: Jon Anderson MRN: [id number] DOB/AGE: 07-12-1950 62 y.o.  Admit date: 11/14/2012 Discharge date: 11/15/2012  Primary Discharge Diagnosis:    Acute chest pain - possibly secondary to hypertension   Secondary Discharge Diagnosis:    HYPERLIPIDEMIA   Hypertension   CAD (coronary artery disease)   Chronic kidney disease (CKD), stage IV (severe)  Procedures: Chest x-ray  Hospital Course: VUONG MCNULTY is a 62 y.o. male with a history of CAD. He had recurrent chest pain and came to the lesser emergency room. His EKG was abnormal and initially he was thought to be a STEMI. However, on further review, his ECG was felt to be essentially unchanged. He was transferred to [facility] and admitted for further evaluation and treatment.  His cardiac enzymes were negative for MI. His other labs were reviewed and are listed below. No critical abnormalities were found. His BNP was slightly elevated but his chest x-ray show any acute abnormality. His initial systolic blood pressure was 233 with an initial diastolic blood pressure as high as 112. There was concern that his symptoms were from possible hypertension. Additionally, his renal function was reviewed. His chronic kidney disease has progressed from stage III to stage IV in the last 8 months. His renal function is worse than previous values. The importance of compliance with his hypertensive medication regimen was reinforced. He had amlodipine added to his medication regimen.  On 11/15/2012, Mr. Talavera was evaluated by Dr. Martinique. His cardiac enzymes have remained negative for MI. His symptoms had improved with better blood pressure control. He was ambulating and doing well. He is considered stable for discharge on 11/15/2012, to follow up as an outpatient.   There is concern for possible sleep apnea, consider sleep study as an outpatient.  Labs:  Lab Results  Component Value Date   WBC 6.0  11/14/2012   HGB 12.2* 11/14/2012   HCT 35.5* 11/14/2012   MCV 85.1 11/14/2012   PLT 182 11/14/2012     Recent Labs Lab 11/15/12 0425  NA 138  K 3.9  CL 103  CO2 25  BUN 36*  CREATININE 3.36*  CALCIUM 8.6  PROT 6.4  BILITOT 0.4  ALKPHOS 96  ALT 16  AST 16  GLUCOSE 119*    Recent Labs  11/14/12 1743 11/14/12 2145 11/15/12 0425  TROPONINI <0.30 <0.30 <0.30   Lipid Panel     Component Value Date/Time   CHOL 103 11/15/2012 0425   TRIG 67 11/15/2012 0425   HDL 44 11/15/2012 0425   CHOLHDL 2.3 11/15/2012 0425   VLDL 13 11/15/2012 0425   LDLCALC 46 11/15/2012 0425    Pro B Natriuretic peptide (BNP)  Date/Time Value Range Status  11/14/2012  3:14 PM 685.6* 0 - 125 pg/mL Final    Recent Labs  11/14/12 1138  INR 0.94   Lab Results  Component Value Date   TSH 1.054 11/14/2012      Radiology: Dg Chest Port 1 View 11/14/2012   *RADIOLOGY REPORT*  Clinical Data: Chest pain and short of breath.  Atrial fibrillation  PORTABLE CHEST - 1 VIEW  Comparison: 03/19/2012  Findings: Heart size is upper normal.  Negative for heart failure. The lungs are clear without infiltrate effusion or mass.  IMPRESSION: No active cardiopulmonary abnormality.   Original Report Authenticated By: Carl Best, M.D.   EKG:  14-Nov-2012 11:29:48  SINUS RHYTHM BORDERLINE T ABNORMALITIES, ANT-LAT LEADS BORDERLINE ST ELEVATION, ANTERIOR LEADS No significant change  since last tracing Vent. rate 64 BPM PR interval 144 ms QRS duration 96 ms QT/QTc 408/421 ms P-R-T axes 55 19 19  FOLLOW UP PLANS AND APPOINTMENTS Allergies  Allergen Reactions  . Kiwi Extract Swelling     Medication List         amLODipine 2.5 MG tablet  Commonly known as:  NORVASC  Take 1 tablet (2.5 mg total) by mouth daily.     aspirin EC 81 MG tablet  Take 1 tablet (81 mg total) by mouth daily.     carvedilol 25 MG tablet  Commonly known as:  COREG  Take 25 mg by mouth 2 (two) times daily with a meal.     cloNIDine 0.3 MG tablet    Commonly known as:  CATAPRES  Take 1 tablet (0.3 mg total) by mouth 2 (two) times daily.     esomeprazole 40 MG capsule  Commonly known as:  NEXIUM  Take 1 capsule (40 mg total) by mouth daily before breakfast.     insulin aspart 100 UNIT/ML injection  Commonly known as:  novoLOG  Inject into the skin continuous. Per insulin pump     nitroGLYCERIN 0.4 MG SL tablet  Commonly known as:  NITROSTAT  Place 1 tablet (0.4 mg total) under the tongue every 5 (five) minutes as needed for chest pain.     rosuvastatin 40 MG tablet  Commonly known as:  CRESTOR  Take 1 tablet (40 mg total) by mouth daily.     tamsulosin 0.4 MG Caps capsule  Commonly known as:  FLOMAX  Take 1 capsule (0.4 mg total) by mouth daily.        Discharge Orders   Future Orders Complete By Expires     Diet - low sodium heart healthy  As directed     Increase activity slowly  As directed       Follow-up Information   Follow up with [place]. (The office will call.)    Contact information:   [address] [place] [address]       BRING ALL MEDICATIONS WITH YOU TO FOLLOW UP APPOINTMENTS  Time spent with patient to include physician time: 32 min Signed: Rosaria Ferries, PA-C 11/15/2012, 5:33 PM Co-Sign MD

## 2012-11-16 NOTE — Discharge Summary (Signed)
Patient seen and examined and history reviewed. Agree with above findings and plan. See earlier rounding note.  Collier Salina JordanMD 11/16/2012 8:04 AM

## 2012-12-11 ENCOUNTER — Ambulatory Visit (INDEPENDENT_AMBULATORY_CARE_PROVIDER_SITE_OTHER): Payer: BC Managed Care – PPO | Admitting: Physician Assistant

## 2012-12-11 ENCOUNTER — Encounter: Payer: Self-pay | Admitting: Physician Assistant

## 2012-12-11 VITALS — BP 140/82 | HR 63 | Wt 189.0 lb

## 2012-12-11 DIAGNOSIS — I2510 Atherosclerotic heart disease of native coronary artery without angina pectoris: Secondary | ICD-10-CM

## 2012-12-11 DIAGNOSIS — N184 Chronic kidney disease, stage 4 (severe): Secondary | ICD-10-CM

## 2012-12-11 DIAGNOSIS — I10 Essential (primary) hypertension: Secondary | ICD-10-CM

## 2012-12-11 NOTE — Patient Instructions (Signed)
Your physician recommends that you schedule a follow-up appointment in:  2 [place] [facility] Your physician recommends that you continue on your current medications as directed. Please refer to the Current Medication list given to you today.  2 Gram Low Sodium Diet A 2 gram sodium diet restricts the amount of sodium in the diet to no more than 2 g or 2000 mg daily. Limiting the amount of sodium is often used to help lower blood pressure. It is important if you have heart, liver, or kidney problems. Many foods contain sodium for flavor and sometimes as a preservative. When the amount of sodium in a diet needs to be low, it is important to know what to look for when choosing foods and drinks. The following includes some information and guidelines to help make it easier for you to adapt to a low sodium diet. QUICK TIPS  Do not add salt to food.  Avoid convenience items and fast food.  Choose unsalted snack foods.  Buy lower sodium products, often labeled as "lower sodium" or "no salt added."  Check food labels to learn how much sodium is in 1 serving.  When eating at a restaurant, ask that your food be prepared with less salt or none, if possible. READING FOOD LABELS FOR SODIUM INFORMATION The nutrition facts label is a good place to find how much sodium is in foods. Look for products with no more than 500 to 600 mg of sodium per meal and no more than 150 mg per serving. Remember that 2 g = 2000 mg. The food label may also list foods as:  Sodium-free: Less than 5 mg in a serving.  Very low sodium: 35 mg or less in a serving.  Low-sodium: 140 mg or less in a serving.  Light in sodium: 50% less sodium in a serving. For example, if a food that usually has 300 mg of sodium is changed to become light in sodium, it will have 150 mg of sodium.  Reduced sodium: 25% less sodium in a serving. For example, if a food that usually has 400 mg of sodium is changed to reduced sodium, it will have  300 mg of sodium. CHOOSING FOODS Grains  Avoid: Salted crackers and snack items. Some cereals, including instant hot cereals. Bread stuffing and biscuit mixes. Seasoned rice or pasta mixes.  Choose: Unsalted snack items. Low-sodium cereals, oats, puffed wheat and rice, shredded wheat. English muffins and bread. Pasta. Meats  Avoid: Salted, canned, smoked, spiced, pickled meats, including fish and poultry. Bacon, ham, sausage, cold cuts, hot dogs, anchovies.  Choose: Low-sodium canned tuna and salmon. Fresh or frozen meat, poultry, and fish. Dairy  Avoid: Processed cheese and spreads. Cottage cheese. Buttermilk and condensed milk. Regular cheese.  Choose: Milk. Low-sodium cottage cheese. Yogurt. Sour cream. Low-sodium cheese. Fruits and Vegetables  Avoid: Regular canned vegetables. Regular canned tomato sauce and paste. Frozen vegetables in sauces. Olives. Angie Fava. Relishes. Sauerkraut.  Choose: Low-sodium canned vegetables. Low-sodium tomato sauce and paste. Frozen or fresh vegetables. Fresh and frozen fruit. Condiments  Avoid: Canned and packaged gravies. Worcestershire sauce. Tartar sauce. Barbecue sauce. Soy sauce. Steak sauce. Ketchup. Onion, garlic, and table salt. Meat flavorings and tenderizers.  Choose: Fresh and dried herbs and spices. Low-sodium varieties of mustard and ketchup. Lemon juice. Tabasco sauce. Horseradish. SAMPLE 2 GRAM SODIUM MEAL PLAN Breakfast / Sodium (mg)  1 cup low-fat milk / [id number] mg  2 slices whole-wheat toast / 270 mg  1 tbs heart-healthy margarine /  153 mg  1 hard-boiled egg / 139 mg  1 small orange / 0 mg Lunch / Sodium (mg)  1 cup raw carrots / 76 mg   cup hummus / 298 mg  1 cup low-fat milk / 143 mg   cup red grapes / 2 mg  1 whole-wheat pita bread / 356 mg Dinner / Sodium (mg)  1 cup whole-wheat pasta / 2 mg  1 cup low-sodium tomato sauce / 73 mg  3 oz lean ground beef / 57 mg  1 small side salad (1 cup raw spinach leaves,   cup cucumber,  cup yellow bell pepper) with 1 tsp olive oil and 1 tsp red wine vinegar / 25 mg Snack / Sodium (mg)  1 container low-fat vanilla yogurt / 107 mg  3 graham cracker squares / 127 mg Nutrient Analysis  Calories: 2033  Protein: 77 g  Carbohydrate: 282 g  Fat: 72 g  Sodium: 1971 mg Document Released: 03/27/2005 Document Revised: 06/19/2011 Document Reviewed: 06/28/2009 [employer] Patient Information 2014 [place].

## 2012-12-11 NOTE — Assessment & Plan Note (Signed)
His kidney disease has progressed from stage III to stage IV in the past 8 months. Importance of compliance with his antihypertensive medication has been reinforced.

## 2012-12-11 NOTE — Assessment & Plan Note (Signed)
Patient had recent hospitalization with significant hypertension due to stopping his medications for 15 days. He also had chest pain associated with this but ruled out for an MI. His medications were resumed and his blood pressure is much better controlled. He still admits to eating a lot of salt. I discussed importance of low sodium diet. I will hold off on increasing his amlodipine because of history of edema.

## 2012-12-11 NOTE — Assessment & Plan Note (Signed)
Stable without further chest pain

## 2012-12-11 NOTE — Progress Notes (Signed)
HPI:  This is a 62 year old African American male patient who was recently hospitalized with chest pain and extreme hypertension with a blood pressure 233/112. He had stopped his medications for about 15 days. Initially it was felt he had an MI based on EKG but on further review this was unchanged. His cardiac enzymes were negative. His medications were resumed and the importance of compliance was reinforced. Additionally his chronic kidney disease has progressed from stage III to stage IV in the past 8 months.  The patient is back to work and has had very little chest pain since he's been home. He does not watch his salt intake closely. 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 He does not want his amlodipine increased because it causes significant edema.  The patient has history of coronary artery disease with nonobstructive CAD on cath in 3/10 except for an 80% small diagonal treated medically. He has normal LV function. He also has diabetes mellitus and is on an insulin pump.  Allergies -- Kiwi Extract -- Swelling  Current Outpatient Prescriptions on File Prior to Visit: amLODipine (NORVASC) 2.5 MG tablet, Take 1 tablet (2.5 mg total) by mouth daily., Disp: 30 tablet, Rfl: 11 aspirin EC 81 MG tablet, Take 1 tablet (81 mg total) by mouth daily., Disp: , Rfl:  carvedilol (COREG) 25 MG tablet, Take 25 mg by mouth 2 (two) times daily with a meal., Disp: , Rfl:  cloNIDine (CATAPRES) 0.3 MG tablet, Take 1 tablet (0.3 mg total) by mouth 2 (two) times daily., Disp: 30 tablet, Rfl: 11 esomeprazole (NEXIUM) 40 MG capsule, Take 1 capsule (40 mg total) by mouth daily before breakfast., Disp: 30 capsule, Rfl: 3 insulin aspart (NOVOLOG) 100 UNIT/ML injection, Inject into the skin continuous. Per insulin pump, Disp: , Rfl:  nitroGLYCERIN (NITROSTAT) 0.4 MG SL tablet, Place 1 tablet (0.4 mg total) under the tongue every 5 (five) minutes as needed for chest pain., Disp: 25  tablet, Rfl: 3 rosuvastatin (CRESTOR) 40 MG tablet, Take 1 tablet (40 mg total) by mouth daily., Disp: 30 tablet, Rfl: 11 Tamsulosin HCl (FLOMAX) 0.4 MG CAPS, Take 1 capsule (0.4 mg total) by mouth daily., Disp: 30 capsule, Rfl: 1  No current facility-administered medications on file prior to visit.   Past Medical History:   Diabetes mellitus                                            Hyperlipidemia                                               HTN (hypertension)                                             Comment:echo 3/10: EF 60%, LAE   CAD (coronary artery disease)                                  Comment:a. cath 3/10: oLAD 25%, mLAD 25%; CFX 25%; oDx  80% (small - tx. medically); pRCA 25%, mRCA               25%; OM3 20%;  b.  Myoview 4/11: EF 53%, no               scar or ischemia   c. MV 2012 Nl perfusion,               apical thinning.  No ischemia or scar.  EF 49%,              appears greater by visual estimate.;  d.  Dob               stress echo 12/13:  Negative Dob stress echo.               There is no evidence of ischemia.  The LVF is               normal.   Chronic chest pain                                           GERD (gastroesophageal reflux disease)                       Nephrolithiasis                                              CKD (chronic kidney disease), stage IV                         Comment:a. 05/2012 Renal u/s: medical renal dzs;  b.               seen by [facility]   Carotid stenosis                                               Comment:a. dopplers 05/2012:  [id number] RICA; [id number] LICA =>              repeat in 6 mos   Snores                                                         Comment:a. presumed OSA, pt has refused sleep eval in               past.  Past Surgical History:   TONSILLECTOMY                                                 ADENOIDECTOMY  CARDIAC CATHETERIZATION                           2001 and *  Review of patient's family history indicates:   Heart attack                   Sister                     Comment: died @ 9   Cancer                         Mother                     Comment: died @ 21   Diabetes                       Brother                    Comment: deceased   Social History   Marital Status: Married             Spouse Name:                      Years of Education:                 Number of children:             Occupational History   None on file  Social History Main Topics   Smoking Status: Former Smoker                   Packs/Day: 0.00  Years:         Smokeless Status: Not on file                      Alcohol Use: No             Drug Use: No             Sexual Activity: Yes                    Birth Control/Protection: None  Other Topics            Concern   None on file  Social History Narrative   The patient is a Retail buyer.  He is married and has 2 grown children.  Lives in [place] with his wife.  Denies tobacco, alcohol or IV drug abuse or marijuana or cocaine intake.     ROS: See history of present illness otherwise negative   PHYSICAL EXAM: Well-nournished, in no acute distress. Neck: No JVD, HJR, Bruit, or thyroid enlargement  Lungs: No tachypnea, clear without wheezing, rales, or rhonchi  Cardiovascular: RRR, PMI not displaced, heart sounds distant, positive S4, no murmur, bruit, thrill, or heave.  Abdomen: BS normal. Soft without organomegaly, masses, lesions or tenderness.  Extremities: trace of edema bilaterally, otherwise lower extremitieswithout cyanosis, clubbing. Good distal pulses bilateral  SKin: Warm, no lesions or rashes   Musculoskeletal: No deformities  Neuro: no focal signs  BP 140/82  Pulse 63  Wt 189 lb (85.73 kg)  BMI 26.46 kg/m2   EKG: Normal sinus rhythm with nonspecific ST-T wave abnormality

## 2013-02-18 ENCOUNTER — Ambulatory Visit: Payer: BC Managed Care – PPO | Admitting: Cardiology

## 2013-05-01 ENCOUNTER — Other Ambulatory Visit: Payer: Self-pay

## 2013-05-01 MED ORDER — ROSUVASTATIN CALCIUM 40 MG PO TABS: 40.0000 mg | ORAL_TABLET | Freq: Every day | ORAL | Status: DC

## 2013-05-29 ENCOUNTER — Ambulatory Visit: Payer: Self-pay | Admitting: Podiatry

## 2013-06-14 ENCOUNTER — Encounter (HOSPITAL_COMMUNITY): Payer: Self-pay | Admitting: Emergency Medicine

## 2013-06-14 ENCOUNTER — Emergency Department (HOSPITAL_COMMUNITY)
Admission: EM | Admit: 2013-06-14 | Discharge: 2013-06-14 | Disposition: A | Discharge status: Home / Self Care | Payer: BC Managed Care – PPO | Source: Home / Self Care | Attending: Family Medicine | Admitting: Family Medicine

## 2013-06-14 ENCOUNTER — Emergency Department (INDEPENDENT_AMBULATORY_CARE_PROVIDER_SITE_OTHER): Payer: BC Managed Care – PPO

## 2013-06-14 DIAGNOSIS — M79609 Pain in unspecified limb: Secondary | ICD-10-CM

## 2013-06-14 DIAGNOSIS — M79606 Pain in leg, unspecified: Secondary | ICD-10-CM

## 2013-06-14 MED ORDER — TRAMADOL HCL 50 MG PO TABS
50.0000 mg | ORAL_TABLET | Freq: Four times a day (QID) | ORAL | Status: DC | PRN
Start: 2013-06-14 — End: 2013-09-30

## 2013-06-14 NOTE — Discharge Instructions (Signed)
Pain of Unknown Etiology (Pain Without a Known Cause) You have come to your caregiver because of pain. Pain can occur in any part of the body. Often there is not a definite cause. If your laboratory (blood or urine) work was normal and X-rays or other studies were normal, your caregiver may treat you without knowing the cause of the pain. An example of this is the headache. Most headaches are diagnosed by taking a history. This means your caregiver asks you questions about your headaches. Your caregiver determines a treatment based on your answers. Usually testing done for headaches is normal. Often testing is not done unless there is no response to medications. Regardless of where your pain is located today, you can be given medications to make you comfortable. If no physical cause of pain can be found, most cases of pain will gradually leave as suddenly as they came.  If you have a painful condition and no reason can be found for the pain, it is important that you follow up with your caregiver. If the pain becomes worse or does not go away, it may be necessary to repeat tests and look further for a possible cause.  Only take over-the-counter or prescription medicines for pain, discomfort, or fever as directed by your caregiver.  For the protection of your privacy, test results cannot be given over the phone. Make sure you receive the results of your test. Ask how these results are to be obtained if you have not been informed. It is your responsibility to obtain your test results.  You may continue all activities unless the activities cause more pain. When the pain lessens, it is important to gradually resume normal activities. Resume activities by beginning slowly and gradually increasing the intensity and duration of the activities or exercise. During periods of severe pain, bed rest may be helpful. Lie or sit in any position that is comfortable.  Ice used for acute (sudden) conditions may be effective.  Use a large plastic bag filled with ice and wrapped in a towel. This may provide pain relief.  See your caregiver for continued problems. Your caregiver can help or refer you for exercises or physical therapy if necessary. If you were given medications for your condition, do not drive, operate machinery or power tools, or sign legal documents for 24 hours. Do not drink alcohol, take sleeping pills, or take other medications that may interfere with treatment. See your caregiver immediately if you have pain that is becoming worse and not relieved by medications. Document Released: 12/20/2000 Document Revised: 01/15/2013 Document Reviewed: 03/27/2005 [facility] Patient Information 2014 [place].

## 2013-06-14 NOTE — ED Provider Notes (Signed)
Medical screening examination/treatment/procedure(s) were performed by resident physician or non-physician practitioner and as supervising physician I was immediately available for consultation/collaboration.   Pauline Good MD.   Billy Fischer, MD 06/14/13 [phone]

## 2013-06-14 NOTE — ED Provider Notes (Signed)
CSN: [id number]     Arrival date & time 06/14/13  1210 History   First MD Initiated Contact with Patient 06/14/13 1345     Chief Complaint  Patient presents with  . Leg Pain   (Consider location/radiation/quality/duration/timing/severity/associated sxs/prior Treatment) HPI Comments: 63 year old male presents for evaluation of left leg pain. For the last couple months, he gets pain in his mid anterior left shin. The pain becomes severe and Persists until he gets a chance to sit down. The pain lessens at night. He denies any known injury. He has never had any problems with the leg before about 2 months ago. He denies any other symptoms. There is no numbness in the leg.  Patient is a 63 y.o. male presenting with leg pain.  Leg Pain Associated symptoms: no fatigue and no fever     Past Medical History  Diagnosis Date  . Diabetes mellitus   . Hyperlipidemia   . HTN (hypertension)     echo 3/10: EF 60%, LAE  . CAD (coronary artery disease)     a. cath 3/10: oLAD 25%, mLAD 25%; CFX 25%; oDx 80% (small - tx. medically); pRCA 25%, mRCA 25%; OM3 20%;  b.  Myoview 4/11: EF 53%, no scar or ischemia   c. MV 2012 Nl perfusion, apical thinning.  No ischemia or scar.  EF 49%, appears greater by visual estimate.;  d.  Dob stress echo 12/13:  Negative Dob stress echo. There is no evidence of ischemia.  The LVF is normal.  . Chronic chest pain   . GERD (gastroesophageal reflux disease)   . Nephrolithiasis   . CKD (chronic kidney disease), stage IV     a. 05/2012 Renal u/s: medical renal dzs;  b. seen by [facility]  . Carotid stenosis     a. dopplers 05/2012:  [id number] RICA; [id number] LICA => repeat in 6 mos  . Snores     a. presumed OSA, pt has refused sleep eval in past.   Past Surgical History  Procedure Laterality Date  . Tonsillectomy    . Adenoidectomy    . Cardiac catheterization  2001 and 2010    Family History  Problem Relation Age of Onset  . Heart attack Sister     died @ 15  . Cancer  Mother     died @ 24  . Diabetes Brother     deceased   History  Substance Use Topics  . Smoking status: Former Research scientist (life sciences)  . Smokeless tobacco: Not on file  . Alcohol Use: No    Review of Systems  Constitutional: Negative for fever, chills and fatigue.  HENT: Negative for sore throat.   Eyes: Negative for visual disturbance.  Respiratory: Negative for cough and shortness of breath.   Cardiovascular: Negative for chest pain, palpitations and leg swelling.  Gastrointestinal: Negative for nausea, vomiting, abdominal pain, diarrhea and constipation.  Genitourinary: Negative for dysuria, urgency, frequency and hematuria.  Musculoskeletal:       See history of present illness regarding left leg pain  Skin: Negative for rash.  Neurological: Negative for dizziness, weakness and light-headedness.    Allergies  Kiwi extract  Home Medications   Current Outpatient Rx  Name  Route  Sig  Dispense  Refill  . amLODipine (NORVASC) 2.5 MG tablet   Oral   Take 1 tablet (2.5 mg total) by mouth daily.   30 tablet   11   . aspirin EC 81 MG tablet   Oral   Take 1  tablet (81 mg total) by mouth daily.         . carvedilol (COREG) 25 MG tablet   Oral   Take 25 mg by mouth 2 (two) times daily with a meal.         . Cholecalciferol (VITAMIN D PO)   Oral   Take by mouth.         . cloNIDine (CATAPRES) 0.3 MG tablet   Oral   Take 1 tablet (0.3 mg total) by mouth 2 (two) times daily.   30 tablet   11   . esomeprazole (NEXIUM) 40 MG capsule   Oral   Take 1 capsule (40 mg total) by mouth daily before breakfast.   30 capsule   3   . Fe Fum-FePoly-Vit C-Vit B3 (INTEGRA PO)   Oral   Take 1 tablet by mouth daily.         . insulin aspart (NOVOLOG) 100 UNIT/ML injection   Subcutaneous   Inject into the skin continuous. Per insulin pump         . nitroGLYCERIN (NITROSTAT) 0.4 MG SL tablet   Sublingual   Place 1 tablet (0.4 mg total) under the tongue every 5 (five) minutes as  needed for chest pain.   25 tablet   3   . rosuvastatin (CRESTOR) 40 MG tablet   Oral   Take 1 tablet (40 mg total) by mouth daily.   30 tablet   6   . Tamsulosin HCl (FLOMAX) 0.4 MG CAPS   Oral   Take 1 capsule (0.4 mg total) by mouth daily.   30 capsule   1   . traMADol (ULTRAM) 50 MG tablet   Oral   Take 1 tablet (50 mg total) by mouth every 6 (six) hours as needed.   20 tablet   0    BP 153/72  Pulse 69  Temp(Src) 98 F (36.7 C) (Oral)  Resp 18  SpO2 98% Physical Exam  Nursing note and vitals reviewed. Constitutional: He is oriented to person, place, and time. He appears well-developed and well-nourished. No distress.  HENT:  Head: Normocephalic.  Cardiovascular: Normal rate, regular rhythm and normal heart sounds.  Exam reveals no gallop and no friction rub.   No murmur heard. Pulses:      Dorsalis pedis pulses are 2+ on the left side.  Pulmonary/Chest: Effort normal and breath sounds normal. No respiratory distress. He has no wheezes. He has no rales.  Musculoskeletal:       Left lower leg: Normal. He exhibits no tenderness, no bony tenderness, no swelling, no edema, no deformity and no laceration.  Neurological: He is alert and oriented to person, place, and time. He has normal reflexes. No sensory deficit. He exhibits normal muscle tone. Coordination normal.  Skin: Skin is warm and dry. No rash noted. He is not diaphoretic.  Psychiatric: He has a normal mood and affect. Judgment normal.    ED Course  Procedures (including critical care time) Labs Review Labs Reviewed - No data to display Imaging Review Dg Tibia/fibula Left  06/14/2013   CLINICAL DATA:  Pain and soft tissue swelling.  EXAM: LEFT TIBIA AND FIBULA - 2 VIEW  COMPARISON:  None.  FINDINGS: There is no evidence of fracture or other focal bone lesions. There is mild soft tissue swelling at the ankle.  IMPRESSION: No acute bone abnormality.   Electronically Signed   By: Markus Daft M.D.   On:  06/14/2013 15:07  MDM   1. Leg pain    Unsure of the cause of this patient's leg pain. His exam and x-ray was normal. He has no increased swelling on that side. He has good reflexes and good fetal pulses. He has no sensory deficits. I will refer him to orthopedics for further evaluation  New Prescriptions   TRAMADOL (ULTRAM) 50 MG TABLET    Take 1 tablet (50 mg total) by mouth every 6 (six) hours as needed.       Liam Graham, PA-C 06/14/13 1528

## 2013-06-14 NOTE — ED Notes (Signed)
C/o  Left leg pain x couple months.  States "gradually gotten worse".   Denies injury.  No relief with tylenol.

## 2013-07-01 ENCOUNTER — Encounter: Payer: Self-pay | Admitting: Podiatry

## 2013-07-01 ENCOUNTER — Ambulatory Visit (INDEPENDENT_AMBULATORY_CARE_PROVIDER_SITE_OTHER): Payer: BC Managed Care – PPO | Admitting: Podiatry

## 2013-07-01 ENCOUNTER — Ambulatory Visit (INDEPENDENT_AMBULATORY_CARE_PROVIDER_SITE_OTHER): Payer: BC Managed Care – PPO

## 2013-07-01 VITALS — BP 113/66 | HR 78 | Resp 12

## 2013-07-01 DIAGNOSIS — R52 Pain, unspecified: Secondary | ICD-10-CM

## 2013-07-01 DIAGNOSIS — M778 Other enthesopathies, not elsewhere classified: Secondary | ICD-10-CM

## 2013-07-01 DIAGNOSIS — E1059 Type 1 diabetes mellitus with other circulatory complications: Secondary | ICD-10-CM

## 2013-07-01 DIAGNOSIS — B351 Tinea unguium: Secondary | ICD-10-CM

## 2013-07-01 DIAGNOSIS — M779 Enthesopathy, unspecified: Secondary | ICD-10-CM

## 2013-07-01 DIAGNOSIS — M7750 Other enthesopathy of unspecified foot: Secondary | ICD-10-CM

## 2013-07-01 DIAGNOSIS — M79609 Pain in unspecified limb: Secondary | ICD-10-CM

## 2013-07-01 DIAGNOSIS — Q828 Other specified congenital malformations of skin: Secondary | ICD-10-CM

## 2013-07-01 DIAGNOSIS — E1049 Type 1 diabetes mellitus with other diabetic neurological complication: Secondary | ICD-10-CM

## 2013-07-01 NOTE — Progress Notes (Signed)
   Subjective:    Patient ID: Jon Anderson, male    DOB: 1950-11-13, 63 y.o.   MRN: [id number]  HPI PT STATED BOTH FEET HAVE CALLUSES AND BEEN HURTING FOR 4 MONTHS. THE CALLUSES ARE GETTING WORSE AND THE SKIN ARE GETTING THICKER. THE FEET GET AGGRAVATED BY WALKING/ PUTTING PRESSURE. TRIED TO KEEP THEM TRIM.    Review of Systems  Musculoskeletal: Positive for gait problem.  All other systems reviewed and are negative.       Objective:   Physical Exam I have reviewed his past history medications allergies surgeries and social history. Vital signs are stable he is alert and oriented x3. He does appear to be quite tired today. Pulses are palpable bilateral there is slightly diminished. Capillary fill is somewhat sluggish but present. Neurologic sensorium is decreased per since once the monofilament to the level of the toes bilateral. Deep tendon reflexes are not elicitable today muscle strength +5 over 5 dorsiflexors plantar flexors inverters and evertors. Orthopedic evaluation demonstrates all joints distal to the ankle a full range of motion without crepitus that he does have mild HAV deformity hammertoe deformities and pes planus bilateral. Cutaneous evaluation demonstrates supple well hydrated cutis to the dorsum of the foot with reactive hyperkeratosis to the plantar aspect of the bilateral foot and the fifth metatarsals there are reactive hyperkeratosis as well as a boggy sensation upon palpation this feels very much like a bursitis or a bursal inflammation.        Assessment & Plan:  Assessment: Diabetes mellitus with diabetic peripheral vascular disease and diabetic peripheral neuropathy. Hammertoe deformities bilateral. This is resulting in reactive hyperkeratosis plantar aspect of the bilateral foot. Lateralization is resulting in sub-fifth met head bursitis. Reactive calluses bilateral foot.  Plan: Discussed etiology pathology conservative versus surgical therapies at this point  I injected the fifth metatarsal head plantarly bilateral foot with dexamethasone and local anesthetic and debridement all reactive hyperkeratosis. We may need to consider sending him for vascular evaluation and possibly have him started on gabapentin and consider diabetic shoes.

## 2013-08-26 ENCOUNTER — Telehealth (HOSPITAL_COMMUNITY): Payer: Self-pay | Admitting: *Deleted

## 2013-08-26 ENCOUNTER — Encounter: Payer: Self-pay | Admitting: Podiatry

## 2013-08-26 ENCOUNTER — Ambulatory Visit (INDEPENDENT_AMBULATORY_CARE_PROVIDER_SITE_OTHER): Payer: BC Managed Care – PPO | Admitting: Podiatry

## 2013-08-26 VITALS — BP 112/67 | HR 74 | Resp 16

## 2013-08-26 DIAGNOSIS — M2030 Hallux varus (acquired), unspecified foot: Secondary | ICD-10-CM

## 2013-08-26 DIAGNOSIS — E1069 Type 1 diabetes mellitus with other specified complication: Secondary | ICD-10-CM

## 2013-08-26 DIAGNOSIS — L97509 Non-pressure chronic ulcer of other part of unspecified foot with unspecified severity: Secondary | ICD-10-CM

## 2013-08-26 DIAGNOSIS — M205X9 Other deformities of toe(s) (acquired), unspecified foot: Secondary | ICD-10-CM

## 2013-08-26 DIAGNOSIS — E1059 Type 1 diabetes mellitus with other circulatory complications: Secondary | ICD-10-CM

## 2013-08-26 NOTE — Progress Notes (Signed)
He presents today with a chief complaint of calluses to the plantar aspect of the bilateral foot. He denies fever chills nausea vomiting muscle aches and pains.  Objective: Pulses are palpable bilateral. His hammertoe and mallet toe deformities with reactive hyperkeratosis plantar aspect of the foot bilateral. Distal aspect of the hallux bilateral. Upon debridement superficial diabetic ulceration is noted to the hallux left. He still continues to have neuropathy symptoms.  Assessment: Mallet toe deformity hammertoe deformities bilateral. History of diabetes with peripheral vascular disease and diabetic peripheral neuropathy. Ulceration hallux left.  Plan: We discussed the etiology pathology conservative versus surgical therapies. I suggested that he followup with the vascular department for evaluation of his pulses. I also suggested casting for diabetic shoes. I will followup with him in 4 weeks.

## 2013-09-05 ENCOUNTER — Ambulatory Visit (HOSPITAL_COMMUNITY)
Admission: RE | Admit: 2013-09-05 | Discharge: 2013-09-05 | Disposition: A | Discharge status: Home / Self Care | Payer: BC Managed Care – PPO | Source: Ambulatory Visit | Attending: Internal Medicine | Admitting: Internal Medicine

## 2013-09-05 DIAGNOSIS — I739 Peripheral vascular disease, unspecified: Secondary | ICD-10-CM | POA: Insufficient documentation

## 2013-09-05 DIAGNOSIS — E1059 Type 1 diabetes mellitus with other circulatory complications: Secondary | ICD-10-CM

## 2013-09-05 DIAGNOSIS — E1049 Type 1 diabetes mellitus with other diabetic neurological complication: Secondary | ICD-10-CM | POA: Insufficient documentation

## 2013-09-05 DIAGNOSIS — L97509 Non-pressure chronic ulcer of other part of unspecified foot with unspecified severity: Secondary | ICD-10-CM | POA: Insufficient documentation

## 2013-09-05 NOTE — Progress Notes (Signed)
Lower Extremity Arterial Duplex Completed. °Brianna L Mazza,RVT °

## 2013-09-09 ENCOUNTER — Telehealth: Payer: Self-pay | Admitting: *Deleted

## 2013-09-09 NOTE — Telephone Encounter (Signed)
Calling to let you know that patients doppler study was positive for Peripheral Artery Disease.  Does he want [place] to go ahead and schedule followup with Dr. Gwenlyn Found or does Dr. Milinda Pointer want to see the patient first and inform him about the doppler and referral to Dr. Gwenlyn Found?  I told them to go ahead and schedule patient for follow-up with Dr. Gwenlyn Found.

## 2013-09-11 ENCOUNTER — Telehealth (HOSPITAL_COMMUNITY): Payer: Self-pay | Admitting: *Deleted

## 2013-09-15 ENCOUNTER — Telehealth: Payer: Self-pay | Admitting: Cardiovascular Disease

## 2013-09-16 NOTE — Telephone Encounter (Signed)
Closed encounter °

## 2013-09-19 ENCOUNTER — Other Ambulatory Visit: Payer: Self-pay | Admitting: Internal Medicine

## 2013-09-19 ENCOUNTER — Ambulatory Visit: Payer: BC Managed Care – PPO | Admitting: Cardiovascular Disease

## 2013-09-19 DIAGNOSIS — N19 Unspecified kidney failure: Secondary | ICD-10-CM

## 2013-09-22 ENCOUNTER — Telehealth: Payer: Self-pay | Admitting: *Deleted

## 2013-09-22 NOTE — Telephone Encounter (Signed)
I called and informed him that Dr. Milinda Pointer stated his Doppler results revealed that the pulses are stable.  He will follow-up with him at his scheduled appointment.  Patient stated that's for tomorrow.  He asked why did Dr. Milinda Pointer refer him to a Cardiologist.  I told him Dr. Milinda Pointer will discuss that with him on tomorrow.

## 2013-09-22 NOTE — Telephone Encounter (Signed)
Message copied by Lolita Rieger on Mon Sep 22, 2013 10:11 AM ------      Message from: Clovis Riley E      Created: Thu Sep 18, 2013  8:29 AM                   ----- Message -----         From: Garrel Ridgel, DPM         Sent: 09/14/2013   2:10 PM           To: Avanell Shackleton Prevette, PMAC            Pulses are stable and I will fu with him at his scheduled appointment. ------

## 2013-09-23 ENCOUNTER — Encounter: Payer: Self-pay | Admitting: Podiatry

## 2013-09-23 ENCOUNTER — Ambulatory Visit (INDEPENDENT_AMBULATORY_CARE_PROVIDER_SITE_OTHER): Payer: BC Managed Care – PPO | Admitting: Podiatry

## 2013-09-23 DIAGNOSIS — M2040 Other hammer toe(s) (acquired), unspecified foot: Secondary | ICD-10-CM

## 2013-09-23 DIAGNOSIS — E1059 Type 1 diabetes mellitus with other circulatory complications: Secondary | ICD-10-CM

## 2013-09-23 DIAGNOSIS — L97509 Non-pressure chronic ulcer of other part of unspecified foot with unspecified severity: Secondary | ICD-10-CM

## 2013-09-23 NOTE — Progress Notes (Signed)
He presents today for followup of ulceration distal aspect of the hallux left. He states that he has continued his conservative therapy and wound care. He also had his vascular evaluation and has yet to followup with the vascular Dr.  Loletta Specter: Vital signs are stable he is alert and oriented x3 pulses are barely palpable bilateral foot ulceration to the distal aspect of the hallux left is going to heal quite nicely I see no signs of infection.  Assessment: Well-healing ulcerative lesion peripheral vascular disease left and right.  Plan: Debrided reactive hyperkeratosis today and he was scan for diabetic shoes. I will followup with him once those come in.

## 2013-09-24 ENCOUNTER — Ambulatory Visit
Admission: RE | Admit: 2013-09-24 | Discharge: 2013-09-24 | Disposition: A | Discharge status: Home / Self Care | Payer: BC Managed Care – PPO | Source: Ambulatory Visit | Attending: Internal Medicine | Admitting: Internal Medicine

## 2013-09-24 DIAGNOSIS — N19 Unspecified kidney failure: Secondary | ICD-10-CM

## 2013-09-30 ENCOUNTER — Encounter: Payer: Self-pay | Admitting: Cardiovascular Disease

## 2013-09-30 ENCOUNTER — Ambulatory Visit (INDEPENDENT_AMBULATORY_CARE_PROVIDER_SITE_OTHER): Payer: BC Managed Care – PPO | Admitting: Cardiovascular Disease

## 2013-09-30 VITALS — BP 163/88 | HR 75 | Wt 190.3 lb

## 2013-09-30 DIAGNOSIS — I739 Peripheral vascular disease, unspecified: Secondary | ICD-10-CM

## 2013-09-30 DIAGNOSIS — I779 Disorder of arteries and arterioles, unspecified: Secondary | ICD-10-CM | POA: Insufficient documentation

## 2013-09-30 NOTE — Progress Notes (Signed)
09/30/2013 Jon Anderson   April 10, 1951  [id number]  Primary Physician [name], MD Primary Cardiologist: Lorretta Harp MD Renae Gloss   HPI:  [name] is a delightful 63 year old mildly overweight married African American male father of 2, grandfather one grandchild Works as a Sports coach. He is referred by Dr. Scherrie November for last evaluation probably prior to elective foot surgery. His primary care physician is Dr. Mellody Drown. His clinical factors include hypertension, diabetes and hyperlipidemia. He does not smoke. His sister died of a coiled portion and a 28. He has never had a heart attack or stroke and has had cardiac catheterization remotely that showed noncritical CAD. He does get fairly frequent chest pain and shortness of breath however. He has chronic renal sufficiency with creatinines in the mid 3 range thought to chronic kidney. Lower extremity arterial Doppler studies performed in the office 09/01/13 revealed a right ABI 1.1 the left of 0.76 with a high-frequency signal in the distal left SFA. He does complain of left calf claudication. There is no history of nonhealing wounds.   Current Outpatient Prescriptions  Medication Sig Dispense Refill  . amLODipine (NORVASC) 2.5 MG tablet Take 1 tablet (2.5 mg total) by mouth daily.  30 tablet  11  . aspirin EC 81 MG tablet Take 1 tablet (81 mg total) by mouth daily.      . carvedilol (COREG) 25 MG tablet Take 25 mg by mouth 2 (two) times daily with a meal.      . Cholecalciferol (VITAMIN D PO) Take by mouth.      . cloNIDine (CATAPRES) 0.3 MG tablet Take 1 tablet (0.3 mg total) by mouth 2 (two) times daily.  30 tablet  11  . esomeprazole (NEXIUM) 40 MG capsule Take 1 capsule (40 mg total) by mouth daily before breakfast.  30 capsule  3  . insulin aspart (NOVOLOG) 100 UNIT/ML injection Inject into the skin continuous. Per insulin pump      . nitroGLYCERIN (NITROSTAT) 0.4 MG SL tablet Place 1 tablet (0.4 mg total) under the  tongue every 5 (five) minutes as needed for chest pain.  25 tablet  3   No current facility-administered medications for this visit.    Allergies  Allergen Reactions  . Kiwi Extract Swelling    History   Social History  . Marital Status: Married    Spouse Name: N/A    Number of Children: N/A  . Years of Education: N/A   Occupational History  . Not on file.   Social History Main Topics  . Smoking status: Former Research scientist (life sciences)  . Smokeless tobacco: Not on file  . Alcohol Use: No  . Drug Use: No  . Sexual Activity: Yes    Birth Control/ Protection: None   Other Topics Concern  . Not on file   Social History Narrative   The patient is a Retail buyer.  He is married and has 2 grown children.  Lives in [place] with his wife.  Denies tobacco, alcohol or IV drug abuse or marijuana or cocaine intake.      Review of Systems: General: negative for chills, fever, night sweats or weight changes.  Cardiovascular: negative for chest pain, dyspnea on exertion, edema, orthopnea, palpitations, paroxysmal nocturnal dyspnea or shortness of breath Dermatological: negative for rash Respiratory: negative for cough or wheezing Urologic: negative for hematuria Abdominal: negative for nausea, vomiting, diarrhea, bright red blood per rectum, melena, or hematemesis Neurologic: negative for visual changes, syncope, or dizziness All other systems  reviewed and are otherwise negative except as noted above.    Blood pressure 163/88, pulse 75, weight 190 lb 4.8 oz (86.32 kg).  General appearance: alert and no distress Neck: no adenopathy, no JVD, supple, symmetrical, trachea midline, thyroid not enlarged, symmetric, no tenderness/mass/nodules and loud bruits bilaterally left greater than right Lungs: clear to auscultation bilaterally Heart: regular rate and rhythm, S1, S2 normal, no murmur, click, rub or gallop Extremities: extremities normal, atraumatic, no cyanosis or edema and 2+ right, 1+ left pedal  pulses  EKG not performed today  ASSESSMENT AND PLAN:   Peripheral arterial disease The patient was referred to be by Dr. Scherrie November for peripheral vascular evaluation. 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 He has hammertoe and mallet toe deformities as well as bilateral lower H&E callus formation. Dr. Milinda Pointer referred in here prior to potential surgical intervention for vascular evaluation. He had lower extremity arterial Doppler studies performed 09/05/13 revealed right ABI 1.1 on the left of 0.76. He did have a high-frequency signal in the distal left SFA. He does complain of left calf claudication. On exam he has 2+ right pedal pulse are 1+ left pedal pulses. He does have chronic renal insufficiency as well with creatinines running in the mid 3 range making him high risk of radiocontrast nephropathy for angiography and potential intervention. At this point I prefer conservative management. If he had critical ischemia and was not yet on dialysis we could consider angiography with CO2. I would not recommend any surgical procedure on his left foot at this time because of fear of poor healing and limb loss.  Carotid artery disease Patient had carotid Dopplers performed February of last year that showed moderately severe left internal carotid artery stenosis probably in the 70% range. Neurologically asymptomatic. He is without bruits bilaterally left and right. We will recheck carotid Doppler studies.      Lorretta Harp MD FACP,FACC,FAHA, [facility] 09/30/2013 10:41 AM

## 2013-09-30 NOTE — Assessment & Plan Note (Signed)
Patient had carotid Dopplers performed February of last year that showed moderately severe left internal carotid artery stenosis probably in the 70% range. Neurologically asymptomatic. He is without bruits bilaterally left and right. We will recheck carotid Doppler studies.

## 2013-09-30 NOTE — Progress Notes (Signed)
Dr. Gwenlyn Found suggests no surgery on this left foot.  Please put this in his chart.

## 2013-09-30 NOTE — Assessment & Plan Note (Signed)
The patient was referred to be by Dr. Scherrie November for peripheral vascular evaluation. 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 He has hammertoe and mallet toe deformities as well as bilateral lower H&E callus formation. Dr. Milinda Pointer referred in here prior to potential surgical intervention for vascular evaluation. He had lower extremity arterial Doppler studies performed 09/05/13 revealed right ABI 1.1 on the left of 0.76. He did have a high-frequency signal in the distal left SFA. He does complain of left calf claudication. On exam he has 2+ right pedal pulse are 1+ left pedal pulses. He does have chronic renal insufficiency as well with creatinines running in the mid 3 range making him high risk of radiocontrast nephropathy for angiography and potential intervention. At this point I prefer conservative management. If he had critical ischemia and was not yet on dialysis we could consider angiography with CO2. I would not recommend any surgical procedure on his left foot at this time because of fear of poor healing and limb loss.

## 2013-09-30 NOTE — Patient Instructions (Signed)
Your physician has requested that you have a carotid duplex. This test is an ultrasound of the carotid arteries in your neck. It looks at blood flow through these arteries that supply the brain with blood. Allow one hour for this exam. There are no restrictions or special instructions.  Your physician has requested that you have a lower extremity arterial duplex. This test is an ultrasound of the arteries in the legs. It looks at arterial blood flow in the legs. Allow one hour for Lower Arterial scans. There are no restrictions or special instructions.  Your physician recommends that you schedule a follow-up appointment after you have completed your testing.

## 2013-10-08 ENCOUNTER — Other Ambulatory Visit (HOSPITAL_COMMUNITY): Payer: Self-pay | Admitting: Cardiology

## 2013-10-08 DIAGNOSIS — N189 Chronic kidney disease, unspecified: Secondary | ICD-10-CM

## 2013-10-09 ENCOUNTER — Ambulatory Visit (HOSPITAL_COMMUNITY): Payer: BC Managed Care – PPO | Attending: Nephrology | Admitting: Radiology

## 2013-10-09 DIAGNOSIS — I739 Peripheral vascular disease, unspecified: Secondary | ICD-10-CM | POA: Insufficient documentation

## 2013-10-09 DIAGNOSIS — N19 Unspecified kidney failure: Secondary | ICD-10-CM | POA: Insufficient documentation

## 2013-10-09 DIAGNOSIS — N189 Chronic kidney disease, unspecified: Secondary | ICD-10-CM | POA: Insufficient documentation

## 2013-10-09 DIAGNOSIS — Z87891 Personal history of nicotine dependence: Secondary | ICD-10-CM | POA: Insufficient documentation

## 2013-10-09 DIAGNOSIS — I129 Hypertensive chronic kidney disease with stage 1 through stage 4 chronic kidney disease, or unspecified chronic kidney disease: Secondary | ICD-10-CM | POA: Insufficient documentation

## 2013-10-09 DIAGNOSIS — I2510 Atherosclerotic heart disease of native coronary artery without angina pectoris: Secondary | ICD-10-CM | POA: Insufficient documentation

## 2013-10-09 DIAGNOSIS — E119 Type 2 diabetes mellitus without complications: Secondary | ICD-10-CM | POA: Insufficient documentation

## 2013-10-09 DIAGNOSIS — R0989 Other specified symptoms and signs involving the circulatory and respiratory systems: Secondary | ICD-10-CM | POA: Insufficient documentation

## 2013-10-09 DIAGNOSIS — E785 Hyperlipidemia, unspecified: Secondary | ICD-10-CM | POA: Insufficient documentation

## 2013-10-09 NOTE — Progress Notes (Signed)
Renal artery duplex performed  

## 2013-10-15 ENCOUNTER — Ambulatory Visit (HOSPITAL_COMMUNITY)
Admission: RE | Admit: 2013-10-15 | Discharge: 2013-10-15 | Disposition: A | Discharge status: Home / Self Care | Payer: BC Managed Care – PPO | Source: Ambulatory Visit | Attending: Cardiology | Admitting: Cardiology

## 2013-10-15 DIAGNOSIS — I739 Peripheral vascular disease, unspecified: Secondary | ICD-10-CM

## 2013-10-15 NOTE — Progress Notes (Signed)
Carotid Duplex Completed. °Brianna L Mazza,RVT °

## 2013-10-21 ENCOUNTER — Telehealth: Payer: Self-pay | Admitting: *Deleted

## 2013-10-21 NOTE — Telephone Encounter (Signed)
Message copied by Chauncy Lean on Tue Oct 21, 2013  4:17 PM ------      Message from: Lorretta Harp      Created: Tue Oct 21, 2013  3:31 PM       No previous studies. Moderately tight LICA stenosis. Repeat in 6 months ------

## 2013-10-27 NOTE — Telephone Encounter (Signed)
lmom 

## 2013-10-28 NOTE — Telephone Encounter (Signed)
Follow Up    Pt calling following up on test results. Please call.

## 2013-10-29 NOTE — Telephone Encounter (Signed)
Pt informed of Dr. Kennon Holter decision

## 2013-10-29 NOTE — Telephone Encounter (Signed)
Wait Until I see him an appointment to evaluate

## 2013-10-29 NOTE — Telephone Encounter (Signed)
Pt states he's having a lot of claudication and wants to know if you can give him something for the pain , this pt. Has an appt on the 29th to see you

## 2013-11-05 ENCOUNTER — Encounter: Payer: Self-pay | Admitting: Cardiovascular Disease

## 2013-11-05 ENCOUNTER — Ambulatory Visit (INDEPENDENT_AMBULATORY_CARE_PROVIDER_SITE_OTHER): Payer: BC Managed Care – PPO | Admitting: Cardiovascular Disease

## 2013-11-05 VITALS — BP 142/78 | HR 68 | Ht 71.0 in | Wt 187.3 lb

## 2013-11-05 DIAGNOSIS — R5381 Other malaise: Secondary | ICD-10-CM

## 2013-11-05 DIAGNOSIS — Z01818 Encounter for other preprocedural examination: Secondary | ICD-10-CM

## 2013-11-05 DIAGNOSIS — I739 Peripheral vascular disease, unspecified: Secondary | ICD-10-CM

## 2013-11-05 DIAGNOSIS — Z79899 Other long term (current) drug therapy: Secondary | ICD-10-CM

## 2013-11-05 DIAGNOSIS — D689 Coagulation defect, unspecified: Secondary | ICD-10-CM

## 2013-11-05 DIAGNOSIS — R5383 Other fatigue: Secondary | ICD-10-CM

## 2013-11-05 NOTE — Assessment & Plan Note (Signed)
Moderately severe left internal carotid stenosis in the 70-80% range. We'll continue to follow a semiannual basis. He is on low-dose aspirin and was neurologically asymptomatic. We will continue to follow semiannually.

## 2013-11-05 NOTE — Assessment & Plan Note (Signed)
Patient has a left ABI of 0.76 with a high-frequency signal in his distal left SFA. He is asymptomatic. He does have chronic kidney disease with creatinines running in the 3 range followed by Dr. Marval Regal. I am reticent to expose him to radiocontrast given the potential of exacerbating his renal insufficiency. We discussed performing angiography with CO2 to define his anatomy and determine whether or not intervention to the performed with minimal contrast to improve his symptoms.

## 2013-11-05 NOTE — Progress Notes (Signed)
11/05/2013 Jon Anderson   Apr 24, 1950  Jon Anderson  Primary Physician Jon Panda, MD Primary Cardiologist: Jon Harp MD Jon Anderson   HPI:  Jon Anderson is a delightful 63 year old mildly overweight married African American male father of 2, grandfather  Of one grandchild who Works as a Sports coach. He is referred by Jon Anderson for peripheral vascular evaluation prior to elective foot surgery. His primary care physician is Jon Anderson. His cardiac risk factors include hypertension, diabetes and hyperlipidemia. He does not smoke. His sister died of a myocardial infarction at age 73. He has never had a heart attack or stroke and has had cardiac catheterization remotely that showed noncritical CAD. He does get fairly frequent chest pain and shortness of breath however. He has chronic renal sufficiency with creatinines in the mid 3 range thought to chronic kidney. Lower extremity arterial Doppler studies performed in the office 09/01/13 revealed a right ABI 1.1 the left of 0.76 with a high-frequency signal in the distal left SFA. He does complain of left calf claudication which is lifestyle limiting and affecting his ability to perform work.. There is no history of nonhealing wounds. Recent carotid Dopplers showed moderately severe left internal carotid artery stenosis.    Current Outpatient Prescriptions  Medication Sig Dispense Refill  . amLODipine (NORVASC) 2.5 MG tablet Take 1 tablet (2.5 mg total) by mouth daily.  30 tablet  11  . aspirin EC 81 MG tablet Take 1 tablet (81 mg total) by mouth daily.      . carvedilol (COREG) 25 MG tablet Take 25 mg by mouth 2 (two) times daily with a meal.      . Cholecalciferol (VITAMIN D PO) Take by mouth.      . cloNIDine (CATAPRES) 0.3 MG tablet Take 1 tablet (0.3 mg total) by mouth 2 (two) times daily.  30 tablet  11  . cyclobenzaprine (FLEXERIL) 10 MG tablet Take 10 mg by mouth 3 (three) times daily as needed for muscle spasms.        Marland Kitchen esomeprazole (NEXIUM) 40 MG capsule Take 1 capsule (40 mg total) by mouth daily before breakfast.  30 capsule  3  . furosemide (LASIX) 40 MG tablet Take 40 mg by mouth daily.      . insulin aspart (NOVOLOG) 100 UNIT/ML injection Inject into the skin continuous. Per insulin pump      . nitroGLYCERIN (NITROSTAT) 0.4 MG SL tablet Place 1 tablet (0.4 mg total) under the tongue every 5 (five) minutes as needed for chest pain.  25 tablet  3   No current facility-administered medications for this visit.    Allergies  Allergen Reactions  . Kiwi Extract Swelling    History   Social History  . Marital Status: Married    Spouse Name: N/A    Number of Children: N/A  . Years of Education: N/A   Occupational History  . Not on file.   Social History Main Topics  . Smoking status: Former Research scientist (life sciences)  . Smokeless tobacco: Not on file  . Alcohol Use: No  . Drug Use: No  . Sexual Activity: Yes    Birth Control/ Protection: None   Other Topics Concern  . Not on file   Social History Narrative   The patient is a Retail buyer.  He is married and has 2 grown children.  Lives in [place] with his wife.  Denies tobacco, alcohol or IV drug abuse or marijuana or cocaine intake.      Review  of Systems: General: negative for chills, fever, night sweats or weight changes.  Cardiovascular: negative for chest pain, dyspnea on exertion, edema, orthopnea, palpitations, paroxysmal nocturnal dyspnea or shortness of breath Dermatological: negative for rash Respiratory: negative for cough or wheezing Urologic: negative for hematuria Abdominal: negative for nausea, vomiting, diarrhea, bright red blood per rectum, melena, or hematemesis Neurologic: negative for visual changes, syncope, or dizziness All other systems reviewed and are otherwise negative except as noted above.    Blood pressure 142/78, pulse 68, height 5\' 11"  (1.803 m), weight 187 lb 4.8 oz (84.959 kg).  General appearance: alert and no  distress Neck: no adenopathy, no JVD, supple, symmetrical, trachea midline, thyroid not enlarged, symmetric, no tenderness/mass/nodules and bilateral carotid bruits Lungs: clear to auscultation bilaterally Heart: regular rate and rhythm, S1, S2 normal, no murmur, click, rub or gallop Extremities: extremities normal, atraumatic, no cyanosis or edema  EKG not performed today  ASSESSMENT AND PLAN:   HYPERLIPIDEMIA Currently not on statin therapy.  Peripheral arterial disease Patient has a left ABI of 0.76 with a high-frequency signal in his distal left SFA. He is asymptomatic. He does have chronic kidney disease with creatinines running in the 3 range followed by Dr. Marval Anderson. I am reticent to expose him to radiocontrast given the potential of exacerbating his renal insufficiency. We discussed performing angiography with CO2 to define his anatomy and determine whether or not intervention to the performed with minimal contrast to improve his symptoms.  Carotid artery disease Moderately severe left internal carotid stenosis in the 70-80% range. We'll continue to follow a semiannual basis. He is on low-dose aspirin and was neurologically asymptomatic. We will continue to follow semiannually.      Jon Harp MD FACP,FACC,FAHA, [facility] 11/05/2013 9:01 AM

## 2013-11-05 NOTE — Patient Instructions (Addendum)
Dr. Gwenlyn Found has ordered a peripheral angiogram to be done at [facility] in mid to late August.  This procedure is going to look at the bloodflow in your lower extremities.  If Dr. Gwenlyn Found is able to open up the arteries, you will have to spend one night in the hospital.  If he is not able to open the arteries, you will be able to go home that same day.    After the procedure, you will not be allowed to drive for 3 days or push, pull, or lift anything greater than 10 lbs for one week.    You will be required to have the following tests prior to the procedure:  1. Blood work-the blood work can be done no more than 7 days prior to the procedure.  It can be done at any [facility] lab.  There is one downstairs on the first floor of this building and one in the [place] ([address])  2. Chest Xray-the chest xray order has already been placed at the [facility].     With CO2   Your physician has requested that you have a carotid duplex to be done in 6 months. This test is an ultrasound of the carotid arteries in your neck. It looks at blood flow through these arteries that supply the brain with blood. Allow one hour for this exam. There are no restrictions or special instructions.

## 2013-11-05 NOTE — Assessment & Plan Note (Signed)
Currently not on statin therapy 

## 2013-11-06 ENCOUNTER — Encounter: Payer: Self-pay | Admitting: *Deleted

## 2013-11-14 ENCOUNTER — Ambulatory Visit: Payer: BC Managed Care – PPO | Admitting: *Deleted

## 2013-11-21 ENCOUNTER — Telehealth: Payer: Self-pay | Admitting: Cardiovascular Disease

## 2013-11-21 NOTE — Telephone Encounter (Signed)
Pt was returning Kay's phone call

## 2013-11-21 NOTE — Telephone Encounter (Signed)
Patient states that he is scheduled for a lower extremity angiogram on 12/11/13.  He is having a lot of pain in his legs.  Is there anything we can call in for him?

## 2013-11-21 NOTE — Telephone Encounter (Signed)
I did not call this patient.

## 2013-11-24 ENCOUNTER — Telehealth: Payer: Self-pay | Admitting: Cardiovascular Disease

## 2013-11-24 NOTE — Telephone Encounter (Signed)
Returning your call from Friday,after 5 please [phone].

## 2013-11-25 NOTE — Telephone Encounter (Signed)
Forwarded to Curt Bears - patient requesting pain meds.

## 2013-11-25 NOTE — Telephone Encounter (Signed)
I spoke with patient and advised that Dr Gwenlyn Found does not prescribe pain medication.  I offered to move up the angiogram, but patient declined.

## 2013-12-03 ENCOUNTER — Encounter (HOSPITAL_COMMUNITY): Payer: Self-pay | Admitting: Pharmacy Technician

## 2013-12-05 ENCOUNTER — Ambulatory Visit
Admission: RE | Admit: 2013-12-05 | Discharge: 2013-12-05 | Disposition: A | Discharge status: Home / Self Care | Payer: BC Managed Care – PPO | Source: Ambulatory Visit | Attending: Cardiovascular Disease | Admitting: Cardiovascular Disease

## 2013-12-05 DIAGNOSIS — Z01818 Encounter for other preprocedural examination: Secondary | ICD-10-CM

## 2013-12-05 LAB — CBC
HCT: 32.6 % — ABNORMAL LOW (ref 39.0–52.0)
Hemoglobin: 10.7 g/dL — ABNORMAL LOW (ref 13.0–17.0)
MCH: 27.6 pg (ref 26.0–34.0)
MCHC: 32.8 g/dL (ref 30.0–36.0)
MCV: 84.2 fL (ref 78.0–100.0)
Platelets: 230 10*3/uL (ref 150–400)
RBC: 3.87 MIL/uL — ABNORMAL LOW (ref 4.22–5.81)
RDW: 15.2 % (ref 11.5–15.5)
WBC: 5.3 10*3/uL (ref 4.0–10.5)

## 2013-12-05 LAB — PROTIME-INR
INR: 1.01 (ref <1.50)
Prothrombin Time: 13.3 seconds (ref 11.6–15.2)

## 2013-12-05 LAB — APTT: aPTT: 35 seconds (ref 24–37)

## 2013-12-06 LAB — BASIC METABOLIC PANEL
BUN: 51 mg/dL — ABNORMAL HIGH (ref 6–23)
CO2: 23 mEq/L (ref 19–32)
Calcium: 8.6 mg/dL (ref 8.4–10.5)
Chloride: 104 mEq/L (ref 96–112)
Creat: 5.1 mg/dL — ABNORMAL HIGH (ref 0.50–1.35)
Glucose, Bld: 162 mg/dL — ABNORMAL HIGH (ref 70–99)
Potassium: 4.3 mEq/L (ref 3.5–5.3)
Sodium: 137 mEq/L (ref 135–145)

## 2013-12-06 LAB — TSH: TSH: 1.24 u[IU]/mL (ref 0.350–4.500)

## 2013-12-11 ENCOUNTER — Inpatient Hospital Stay (HOSPITAL_COMMUNITY)
Admission: RE | Admit: 2013-12-11 | Discharge: 2013-12-13 | DRG: 253 | Disposition: A | Discharge status: Home / Self Care | Payer: BC Managed Care – PPO | Source: Ambulatory Visit | Attending: Cardiovascular Disease | Admitting: Cardiovascular Disease

## 2013-12-11 ENCOUNTER — Encounter (HOSPITAL_COMMUNITY): Payer: Self-pay | Admitting: General Practice

## 2013-12-11 ENCOUNTER — Encounter (HOSPITAL_COMMUNITY)
Admission: RE | Disposition: A | Discharge status: Home / Self Care | Payer: Self-pay | Source: Ambulatory Visit | Attending: Cardiovascular Disease

## 2013-12-11 DIAGNOSIS — Z8249 Family history of ischemic heart disease and other diseases of the circulatory system: Secondary | ICD-10-CM

## 2013-12-11 DIAGNOSIS — I739 Peripheral vascular disease, unspecified: Principal | ICD-10-CM | POA: Diagnosis present

## 2013-12-11 DIAGNOSIS — R0789 Other chest pain: Secondary | ICD-10-CM

## 2013-12-11 DIAGNOSIS — N184 Chronic kidney disease, stage 4 (severe): Secondary | ICD-10-CM

## 2013-12-11 DIAGNOSIS — N185 Chronic kidney disease, stage 5: Secondary | ICD-10-CM | POA: Diagnosis present

## 2013-12-11 DIAGNOSIS — Z9641 Presence of insulin pump (external) (internal): Secondary | ICD-10-CM

## 2013-12-11 DIAGNOSIS — I10 Essential (primary) hypertension: Secondary | ICD-10-CM | POA: Diagnosis present

## 2013-12-11 DIAGNOSIS — R079 Chest pain, unspecified: Secondary | ICD-10-CM

## 2013-12-11 DIAGNOSIS — I2510 Atherosclerotic heart disease of native coronary artery without angina pectoris: Secondary | ICD-10-CM | POA: Diagnosis present

## 2013-12-11 DIAGNOSIS — E785 Hyperlipidemia, unspecified: Secondary | ICD-10-CM | POA: Diagnosis present

## 2013-12-11 DIAGNOSIS — N179 Acute kidney failure, unspecified: Secondary | ICD-10-CM | POA: Diagnosis present

## 2013-12-11 DIAGNOSIS — Z794 Long term (current) use of insulin: Secondary | ICD-10-CM

## 2013-12-11 DIAGNOSIS — I120 Hypertensive chronic kidney disease with stage 5 chronic kidney disease or end stage renal disease: Secondary | ICD-10-CM | POA: Diagnosis present

## 2013-12-11 DIAGNOSIS — I70219 Atherosclerosis of native arteries of extremities with intermittent claudication, unspecified extremity: Secondary | ICD-10-CM

## 2013-12-11 DIAGNOSIS — Z7982 Long term (current) use of aspirin: Secondary | ICD-10-CM

## 2013-12-11 DIAGNOSIS — I6529 Occlusion and stenosis of unspecified carotid artery: Secondary | ICD-10-CM | POA: Diagnosis present

## 2013-12-11 DIAGNOSIS — E119 Type 2 diabetes mellitus without complications: Secondary | ICD-10-CM | POA: Diagnosis present

## 2013-12-11 DIAGNOSIS — Z87891 Personal history of nicotine dependence: Secondary | ICD-10-CM

## 2013-12-11 DIAGNOSIS — K219 Gastro-esophageal reflux disease without esophagitis: Secondary | ICD-10-CM | POA: Diagnosis present

## 2013-12-11 HISTORY — PX: LOWER EXTREMITY ANGIOGRAM: SHX5508

## 2013-12-11 HISTORY — PX: ANGIOPLASTY / STENTING FEMORAL: SUR30

## 2013-12-11 LAB — CBC
HCT: 31.8 % — ABNORMAL LOW (ref 39.0–52.0)
Hemoglobin: 10.5 g/dL — ABNORMAL LOW (ref 13.0–17.0)
MCH: 28.1 pg (ref 26.0–34.0)
MCHC: 33 g/dL (ref 30.0–36.0)
MCV: 85 fL (ref 78.0–100.0)
Platelets: 196 10*3/uL (ref 150–400)
RBC: 3.74 MIL/uL — ABNORMAL LOW (ref 4.22–5.81)
RDW: 13.8 % (ref 11.5–15.5)
WBC: 8 10*3/uL (ref 4.0–10.5)

## 2013-12-11 LAB — BASIC METABOLIC PANEL
Anion gap: 14 (ref 5–15)
BUN: 45 mg/dL — ABNORMAL HIGH (ref 6–23)
CO2: 22 mEq/L (ref 19–32)
Calcium: 8.1 mg/dL — ABNORMAL LOW (ref 8.4–10.5)
Chloride: 106 mEq/L (ref 96–112)
Creatinine, Ser: 5.11 mg/dL — ABNORMAL HIGH (ref 0.50–1.35)
GFR calc Af Amer: 13 mL/min — ABNORMAL LOW (ref >90)
GFR calc non Af Amer: 11 mL/min — ABNORMAL LOW (ref >90)
Glucose, Bld: 141 mg/dL — ABNORMAL HIGH (ref 70–99)
Potassium: 4.1 mEq/L (ref 3.7–5.3)
Sodium: 142 mEq/L (ref 137–147)

## 2013-12-11 LAB — GLUCOSE, CAPILLARY
Glucose-Capillary: 88 mg/dL (ref 70–99)
Glucose-Capillary: 117 mg/dL — ABNORMAL HIGH (ref 70–99)
Glucose-Capillary: 117 mg/dL — ABNORMAL HIGH (ref 70–99)
Glucose-Capillary: 166 mg/dL — ABNORMAL HIGH (ref 70–99)

## 2013-12-11 LAB — POCT ACTIVATED CLOTTING TIME
Activated Clotting Time: 185 seconds
Activated Clotting Time: 208 seconds
Activated Clotting Time: 112 seconds

## 2013-12-11 LAB — TROPONIN I
Troponin I: <0.3 ng/mL (ref <0.30)
Troponin I: <0.3 ng/mL (ref <0.30)

## 2013-12-11 SURGERY — ANGIOGRAM, LOWER EXTREMITY
Anesthesia: LOCAL

## 2013-12-11 MED ORDER — ASPIRIN 81 MG PO CHEW: 81.0000 mg | CHEWABLE_TABLET | Freq: Every day | ORAL | Status: DC

## 2013-12-11 MED ORDER — ASPIRIN 81 MG PO CHEW
81.0000 mg | CHEWABLE_TABLET | ORAL | Status: AC
  Administered 2013-12-11: 81 mg via ORAL
  Filled 2013-12-11: qty 1

## 2013-12-11 MED ORDER — HEPARIN (PORCINE) IN NACL 2-0.9 UNIT/ML-% IJ SOLN
INTRAMUSCULAR | Status: AC
  Filled 2013-12-11: qty 1000

## 2013-12-11 MED ORDER — CLOPIDOGREL BISULFATE 75 MG PO TABS
75.0000 mg | ORAL_TABLET | Freq: Every day | ORAL | Status: DC
  Administered 2013-12-12 – 2013-12-13 (×2): 75 mg via ORAL
  Filled 2013-12-11 (×2): qty 1

## 2013-12-11 MED ORDER — SODIUM CHLORIDE 0.9 % IJ SOLN: 3.0000 mL | INTRAMUSCULAR | Status: DC | PRN

## 2013-12-11 MED ORDER — CLONIDINE HCL 0.3 MG PO TABS
0.3000 mg | ORAL_TABLET | Freq: Two times a day (BID) | ORAL | Status: DC
  Administered 2013-12-11 – 2013-12-13 (×4): 0.3 mg via ORAL
  Filled 2013-12-11 (×6): qty 1

## 2013-12-11 MED ORDER — OXYCODONE-ACETAMINOPHEN 5-325 MG PO TABS
1.0000 | ORAL_TABLET | ORAL | Status: DC | PRN
  Administered 2013-12-11: 18:00:00 1 via ORAL
  Filled 2013-12-11: qty 1

## 2013-12-11 MED ORDER — SODIUM CHLORIDE 0.9 % IV SOLN
INTRAVENOUS | Status: AC
Start: 2013-12-11 — End: 2013-12-11
  Administered 2013-12-11: 10:00:00 via INTRAVENOUS

## 2013-12-11 MED ORDER — PANTOPRAZOLE SODIUM 40 MG PO TBEC
80.0000 mg | DELAYED_RELEASE_TABLET | Freq: Every day | ORAL | Status: DC
  Administered 2013-12-11 – 2013-12-12 (×2): 80 mg via ORAL
  Filled 2013-12-11 (×2): qty 2

## 2013-12-11 MED ORDER — SODIUM CHLORIDE 0.9 % IV SOLN
INTRAVENOUS | Status: DC
  Administered 2013-12-11: 07:00:00 via INTRAVENOUS

## 2013-12-11 MED ORDER — LIDOCAINE HCL (PF) 1 % IJ SOLN
INTRAMUSCULAR | Status: AC
  Filled 2013-12-11: qty 30

## 2013-12-11 MED ORDER — CLOPIDOGREL BISULFATE 300 MG PO TABS
ORAL_TABLET | ORAL | Status: AC
  Filled 2013-12-11: qty 1

## 2013-12-11 MED ORDER — AMLODIPINE BESYLATE 5 MG PO TABS
5.0000 mg | ORAL_TABLET | Freq: Every day | ORAL | Status: DC
  Administered 2013-12-11 – 2013-12-13 (×3): 5 mg via ORAL
  Filled 2013-12-11 (×3): qty 1

## 2013-12-11 MED ORDER — ASPIRIN 81 MG PO CHEW
CHEWABLE_TABLET | ORAL | Status: AC
  Filled 2013-12-11: qty 1

## 2013-12-11 MED ORDER — ASPIRIN 81 MG PO CHEW
81.0000 mg | CHEWABLE_TABLET | Freq: Every day | ORAL | Status: DC
  Administered 2013-12-11 – 2013-12-13 (×3): 81 mg via ORAL
  Filled 2013-12-11 (×2): qty 1

## 2013-12-11 MED ORDER — HEPARIN SODIUM (PORCINE) 1000 UNIT/ML IJ SOLN
INTRAMUSCULAR | Status: AC
  Filled 2013-12-11: qty 1

## 2013-12-11 MED ORDER — CARVEDILOL 25 MG PO TABS
25.0000 mg | ORAL_TABLET | Freq: Two times a day (BID) | ORAL | Status: DC
  Administered 2013-12-11 – 2013-12-13 (×4): 25 mg via ORAL
  Filled 2013-12-11 (×6): qty 1

## 2013-12-11 MED ORDER — CYCLOBENZAPRINE HCL 10 MG PO TABS
10.0000 mg | ORAL_TABLET | Freq: Every day | ORAL | Status: DC
  Administered 2013-12-11 – 2013-12-12 (×2): 10 mg via ORAL
  Filled 2013-12-11 (×3): qty 1

## 2013-12-11 MED ORDER — HYDRALAZINE HCL 20 MG/ML IJ SOLN
10.0000 mg | INTRAMUSCULAR | Status: DC | PRN
  Administered 2013-12-11 (×2): 10 mg via INTRAVENOUS
  Filled 2013-12-11 (×2): qty 1

## 2013-12-11 MED ORDER — INSULIN ASPART 100 UNIT/ML ~~LOC~~ SOLN
0.0000 [IU] | SUBCUTANEOUS | Status: DC
  Administered 2013-12-11: 2 [IU] via SUBCUTANEOUS
  Administered 2013-12-12: 1 [IU] via SUBCUTANEOUS
  Administered 2013-12-12: 2 [IU] via SUBCUTANEOUS
  Administered 2013-12-12: 1 [IU] via SUBCUTANEOUS
  Administered 2013-12-13: 2 [IU] via SUBCUTANEOUS

## 2013-12-11 MED ORDER — NITROGLYCERIN 0.4 MG SL SUBL
0.4000 mg | SUBLINGUAL_TABLET | SUBLINGUAL | Status: DC | PRN
  Administered 2013-12-11 (×3): 0.4 mg via SUBLINGUAL
  Filled 2013-12-11 (×2): qty 1

## 2013-12-11 MED ORDER — ASPIRIN EC 81 MG PO TBEC
81.0000 mg | DELAYED_RELEASE_TABLET | Freq: Every day | ORAL | Status: DC
  Filled 2013-12-11: qty 1

## 2013-12-11 MED ORDER — ONDANSETRON HCL 4 MG/2ML IJ SOLN
4.0000 mg | Freq: Four times a day (QID) | INTRAMUSCULAR | Status: DC | PRN
  Filled 2013-12-11: qty 2

## 2013-12-11 MED ORDER — MORPHINE SULFATE 2 MG/ML IJ SOLN: 2.0000 mg | INTRAMUSCULAR | Status: DC | PRN

## 2013-12-11 MED ORDER — ACETAMINOPHEN 325 MG PO TABS: 650.0000 mg | ORAL_TABLET | ORAL | Status: DC | PRN

## 2013-12-11 MED ORDER — MORPHINE SULFATE 2 MG/ML IJ SOLN
1.0000 mg | INTRAMUSCULAR | Status: DC | PRN
  Administered 2013-12-11 (×2): 1 mg via INTRAVENOUS
  Filled 2013-12-11 (×2): qty 1

## 2013-12-11 MED ORDER — HYDRALAZINE HCL 20 MG/ML IJ SOLN
INTRAMUSCULAR | Status: AC
  Filled 2013-12-11: qty 1

## 2013-12-11 MED ORDER — INSULIN GLARGINE 100 UNIT/ML ~~LOC~~ SOLN
10.0000 [IU] | Freq: Every day | SUBCUTANEOUS | Status: AC
  Administered 2013-12-11: 16:00:00 10 [IU] via SUBCUTANEOUS
  Filled 2013-12-11 (×2): qty 0.1

## 2013-12-11 MED ORDER — FUROSEMIDE 40 MG PO TABS
40.0000 mg | ORAL_TABLET | Freq: Every day | ORAL | Status: DC
  Filled 2013-12-11: qty 1

## 2013-12-11 MED ORDER — NITROGLYCERIN IN D5W 200-5 MCG/ML-% IV SOLN
10.0000 ug/min | INTRAVENOUS | Status: DC
  Administered 2013-12-11: 18:00:00 10 ug/min via INTRAVENOUS
  Filled 2013-12-11: qty 250

## 2013-12-11 NOTE — Progress Notes (Signed)
Site area: right groin  Site Prior to Removal:  Level 0  Pressure Applied For 20 MINUTES    Minutes Beginning at 1640  Manual:   Yes.    Patient Status During Pull:  stable  Post Pull Groin Site:  Level 0  Post Pull Instructions Given:  Yes.    Post Pull Pulses Present:  Yes.    Dressing Applied:  Yes.    Comments:

## 2013-12-11 NOTE — Progress Notes (Signed)
Personally seen and examined. Agree with above. His lungs currently sound clear, heart is regular no rubs. We will continue with observation, cycle troponins, I think that it is reasonable to perform stress test, pharmacologic tomorrow. Candee Furbish, MD

## 2013-12-11 NOTE — H&P (Signed)
11/05/2013 [name]    21-Jul-1950   [id number]   Primary Physician [name], MD Primary Cardiologist: Lorretta Harp MD Renae Gloss     HPI:  Mr. Czaja is a delightful 63 year old mildly overweight married African American male father of 2, grandfather  Of one grandchild who Works as a Sports coach. He is referred by Dr. Scherrie November for peripheral vascular evaluation prior to elective foot surgery. His primary care physician is Dr. Mellody Drown. His cardiac risk factors include hypertension, diabetes and hyperlipidemia. He does not smoke. His sister died of a myocardial infarction at age 62. He has never had a heart attack or stroke and has had cardiac catheterization remotely that showed noncritical CAD. He does get fairly frequent chest pain and shortness of breath however. He has chronic renal sufficiency with creatinines in the mid 3 range thought to chronic kidney. Lower extremity arterial Doppler studies performed in the office 09/01/13 revealed a right ABI 1.1 the left of 0.76 with a high-frequency signal in the distal left SFA. He does complain of left calf claudication which is lifestyle limiting and affecting his ability to perform work.. There is no history of nonhealing wounds. Recent carotid Dopplers showed moderately severe left internal carotid artery stenosis.        Current Outpatient Prescriptions   Medication  Sig  Dispense  Refill   .  amLODipine (NORVASC) 2.5 MG tablet  Take 1 tablet (2.5 mg total) by mouth daily.   30 tablet   11   .  aspirin EC 81 MG tablet  Take 1 tablet (81 mg total) by mouth daily.         .  carvedilol (COREG) 25 MG tablet  Take 25 mg by mouth 2 (two) times daily with a meal.         .  Cholecalciferol (VITAMIN D PO)  Take by mouth.         .  cloNIDine (CATAPRES) 0.3 MG tablet  Take 1 tablet (0.3 mg total) by mouth 2 (two) times daily.   30 tablet   11   .  cyclobenzaprine (FLEXERIL) 10 MG tablet  Take 10 mg by mouth  3 (three) times daily as needed for muscle spasms.         Marland Kitchen  esomeprazole (NEXIUM) 40 MG capsule  Take 1 capsule (40 mg total) by mouth daily before breakfast.   30 capsule   3   .  furosemide (LASIX) 40 MG tablet  Take 40 mg by mouth daily.         .  insulin aspart (NOVOLOG) 100 UNIT/ML injection  Inject into the skin continuous. Per insulin pump         .  nitroGLYCERIN (NITROSTAT) 0.4 MG SL tablet  Place 1 tablet (0.4 mg total) under the tongue every 5 (five) minutes as needed for chest pain.   25 tablet   3       No current facility-administered medications for this visit.       Allergies   Allergen  Reactions   .  Kiwi Extract  Swelling       History       Social History   .  Marital Status:  Married       Spouse Name:  N/A       Number of Children:  N/A   .  Years of Education:  N/A  Occupational History   .  Not on file.       Social History Main Topics   .  Smoking status:  Former Research scientist (life sciences)   .  Smokeless tobacco:  Not on file   .  Alcohol Use:  No   .  Drug Use:  No   .  Sexual Activity:  Yes       Birth Control/ Protection:  None       Other Topics  Concern   .  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  Lives in [place] with his wife.  Denies tobacco, alcohol or IV drug abuse or marijuana or cocaine intake.       Review of Systems: General: negative for chills, fever, night sweats or weight changes.   Cardiovascular: negative for chest pain, dyspnea on exertion, edema, orthopnea, palpitations, paroxysmal nocturnal dyspnea or shortness of breath Dermatological: negative for rash Respiratory: negative for cough or wheezing Urologic: negative for hematuria Abdominal: negative for nausea, vomiting, diarrhea, bright red blood per rectum, melena, or hematemesis Neurologic: negative for visual changes, syncope, or dizziness All other systems reviewed and are otherwise negative except as  noted above.       Blood pressure 142/78, pulse 68, height 5\' 11"  (1.803 m), weight 187 lb 4.8 oz (84.959 kg).  General appearance: alert and no distress Neck: no adenopathy, no JVD, supple, symmetrical, trachea midline, thyroid not enlarged, symmetric, no tenderness/mass/nodules and bilateral carotid bruits Lungs: clear to auscultation bilaterally Heart: regular rate and rhythm, S1, S2 normal, no murmur, click, rub or gallop Extremities: extremities normal, atraumatic, no cyanosis or edema   EKG not performed today   ASSESSMENT AND PLAN:    HYPERLIPIDEMIA Currently not on statin therapy.   Peripheral arterial disease Patient has a left ABI of 0.76 with a high-frequency signal in his distal left SFA. He is asymptomatic. He does have chronic kidney disease with creatinines running in the 3 range followed by Dr. Marval Regal. I am reticent to expose him to radiocontrast given the potential of exacerbating his renal insufficiency. We discussed performing angiography with CO2 to define his anatomy and determine whether or not intervention to the performed with minimal contrast to improve his symptoms.   Carotid artery disease Moderately severe left internal carotid stenosis in the 70-80% range. We'll continue to follow a semiannual basis. He is on low-dose aspirin and was neurologically asymptomatic. We will continue to follow semiannually.         Lorretta Harp MD FACP,FACC,FAHA, [facility] 11/05/2013 9:01 AM   H & P will be scanned in.  Pt was reexamined and existing H & P reviewed. No changes found.  Lorretta Harp, MD [facility] 12/11/2013 7:31 AM

## 2013-12-11 NOTE — CV Procedure (Signed)
Jon Anderson is a 63 y.o. male    [id number] LOCATION:  FACILITY: [facility]  PHYSICIAN: [name], M.D. 04-08-1951   DATE OF PROCEDURE:  12/11/2013  DATE OF DISCHARGE:     PV Angiogram/Intervention    History obtained from chart review.Jon Anderson is a delightful 63 year old mildly overweight married African American male father of 2, grandfather Of one grandchild who Works as a Sports coach. He is referred by Dr. Scherrie November for peripheral vascular evaluation prior to elective foot surgery. His primary care physician is Dr. Mellody Drown. His cardiac risk factors include hypertension, diabetes and hyperlipidemia. He does not smoke. His sister died of a myocardial infarction at age 54. He has never had a heart attack or stroke and has had cardiac catheterization remotely that showed noncritical CAD. He does get fairly frequent chest pain and shortness of breath however. He has chronic renal sufficiency with creatinines in the mid 3 range thought to chronic kidney. Lower extremity arterial Doppler studies performed in the office 09/01/13 revealed a right ABI 1.1 the left of 0.76 with a high-frequency signal in the distal left SFA. He does complain of left calf claudication which is lifestyle limiting and affecting his ability to perform work.. There is no history of nonhealing wounds. Recent carotid Dopplers showed moderately severe left internal carotid artery stenosis.    PROCEDURE DESCRIPTION:   The patient was brought to the second floor [facility] cath lab in the postabsorptive state. He was notpremedicated . His right groinwas prepped and shaved in usual sterile fashion. Xylocaine 1% was used  for local anesthesia. A 6 French sheath was inserted into the right common femoral artery using standard Seldinger technique. A 5 French crossover catheter and interval catheters were used to obtain contralateral access CO2 and limited doses of dilute contrast were used for angiography. Retrograde  aortic pressure was monitored during the case.   HEMODYNAMICS:    AO SYSTOLIC/AO DIASTOLIC: [id number]   Angiographic Data:   1: Left SFA-there was a focal 99% stenosis in the left SFA in the adductor canal. This was visualized palmarly with CO2 and ultimately with dilute contrast. Below-the-knee angiography was performed.  IMPRESSION:high-grade distal left SFA stenosis with symptomatic medication. We'll proceed with PTA using chocolate balloon and Lutonix DEB angioplasty  Procedure Description:the patient received 9000 units of heparin intravenously with an ACT of 208. 15 cc of Visipaque dye was used along with 180 cc of CO2. I crossed the bifurcation with a 6 French/55 cm long Ansel sheath over 0.35 Rosen wire and placed it in the proximal left SFA. Following this I crossed the distal left esses SFA lesion with a 0.14/300 cm long Sparta core wire preloaded and a 5 mm x 4 cm chocolate balloon and performed inflation at 6 atmospheres for 3 minutes. Following this I performed angioplasty using a 5 mm x  60 mm long Lutonix  balloon at nominal pressures for 2 minutes. The final angiographic result with reduction of a 99% distal left SFA stenosis to less than 10% residual with small linear non-flow-limiting dissection.  Final Impression: successful chocolate balloon angioplasty followed by Lutonix drug eluting balloon angioplasty using CO2 and minimal contrast for lifestyle and claudication. The patient will be chewed with dual antiplatelet therapy. He'll be hydrated and his renal function will be assessed. She be removed once the ACT falls blows 170 and pressure will be held. He will get followup lower extremity arterial Doppler studies in our [place] office in one week followed by a return  office visit on a day that him in the office with a mid-level provider in 2-3 weeks.    Lorretta Harp MD, [facility] 12/11/2013 9:01 AM

## 2013-12-11 NOTE — Progress Notes (Signed)
See DM coordinator note below this. Will give Lantus as a 1 time dose since the patient will be NPO tomorrow. We can reassess fitness for putting him back on insulin pump once NPO status is no longer needed. Wanda Cellucci PA-C

## 2013-12-11 NOTE — Progress Notes (Signed)
Cr stable compared to recent outpatient value - will trend in AM. Note with PTA he had CO2 assisted procedure with limited doses of dilute contrast. We may need to call renal in AM once we have the trend to see him while he is here to get their thoughts. Laresa Oshiro PA-C

## 2013-12-11 NOTE — Care Management Note (Addendum)
  Page 1 of 1   12/11/2013     11:08:02 AM CARE MANAGEMENT NOTE 12/11/2013  Patient:  Jon Anderson, Jon Anderson   Account Number:  [id number]  Date Initiated:  12/11/2013  Documentation initiated by:  Antavion Bartoszek  Subjective/Objective Assessment:   CAD     Action/Plan:   CM to follow for disposition needs   Anticipated DC Date:  12/12/2013   Anticipated DC Plan:  HOME/SELF CARE         Choice offered to / List presented to:             Status of service:  Completed, signed off Medicare Important Message given?   (If response is "NO", the following Medicare IM given date fields will be blank) Date Medicare IM given:   Medicare IM given by:   Date Additional Medicare IM given:   Additional Medicare IM given by:    Discharge Disposition:  HOME/SELF CARE  Per UR Regulation:  Reviewed for med. necessity/level of care/duration of stay  If discussed at [place] of Stay Meetings, dates discussed:    Comments:  Verlee Pope RN, BSN, MSHL, CCM  Nurse - Case Manager,  (Unit [phone]  12/11/2013 OPIB Med review:  Plavix Disposition Plan:  home / self care CM will continue to monitor for needs as indicated.

## 2013-12-11 NOTE — Progress Notes (Signed)
Called earlier in the evening with pt having more chest pain. IV NTG started, troponin levels negative.  Morphine was increased.  Will continue to monitor.

## 2013-12-11 NOTE — Progress Notes (Signed)
Spoke with patient about his diabetes and insulin pump.  States has had diabetes since 1996.  On insulin pump for 7 years.  Dr. Mickie Hillier is his PCP and takes care of his diabetes. Had been started on pills and SQ insulin when diagnosed. Has been on Lantus in the past before starting on insulin pump. Able to check basal rates and CHO coverage on pump. They are as follows: Basal rate: 0.75 units/hr.    Total: 18 units  CHO coverage: 1 unit/exchange Sensitivity: 50 mg/dl Target BG range: 125 mg/dl  Recommend starting insulin pump back in hospital after patient signs Patient Contract. Will need Insulin Pump Order Set added. OR start Lantus 15 units daily (today) and Novolog   SENSITIVE or MODERATE correction scale TID & HS and when patient is eating.  Patient concerned about CBGs when not eating.

## 2013-12-11 NOTE — Progress Notes (Signed)
Called by nursing as patient had developed 7/10 chest pain within the last hour. EKG sinus bradycardia with nonspecific ST-T changes similar to prior tracing but somewhat more accentuated than prior EKG - J point elevation in V2, minimal ST depression in lead II, V5-V6. He reports a long h/o intermittent chest pain but increasing frequency lately. Cath 2010 (beware - Epic has this listed as loaded in 2012 but the cath itself was 06/2008) - mild diffuse coronary plaque and small vessel disease along with small first diag with 80% ostial stenosis, treated medically. Dobutamine echo 03/2012 was normal. Per patient and his wife, over the last 6-7 months this discomfort seems to has been increasing in frequency to 2-3x/week. It typically occurs with exertion, not usually occurring at rest until today. Chest pain is slightly worse with pleuritic component of sharpness with deep breath (different kind of pain than his background pain). He is not tachycardic, tachypnic or hypoxic. Pain is not worse with palpation. He was hypertensive earlier at 181/83 and received 10mg  IV hydralazine. Upon development of CP, he received 1mg  morphine and SL NTG x 3 with minimal improvement in discomfort to 5/10. BP has subsequently improved to 109/51. He took all usual meds this AM at home, except Norvasc which he received just a short while ago. Most recent outpatient Cr was 5.1 on 12/05/13, previously 3 range in 2014. Per notes, labs had been faxed to Dr. Arty Baumgartner. He does not have any active dialysis access. Exam reveals RRR, no acute distress (resting quietly laying flat in bed without any symptoms), lungs clear, no edema. I called Dr. Marlou Porch to help formulate a plan as Dr. Gwenlyn Found was tied up in a case. As soon as Dr. Gwenlyn Found was available he was also kept informed.  Plan: - Change status to observation     - Hold Lasix today given dye load and Cr    - Cycle troponins  - Stat labs now including BMET and CBC  -> once Cr returns will  consider consulting renal - Continue aspirin, Plavix, Coreg (received aspirin this AM pre-procedure)    - Lipids in AM; consider adding statin   - NPO after midnight for nuclear stress test   - Will hold off on anticoagulation given recent procedure   Sherleen Pangborn PA-C

## 2013-12-11 NOTE — Progress Notes (Signed)
Staff RN called to ask about patient's insulin dosage. Recommend starting Lantus 10 units daily and Novolog SENSITIVE correction scale every 4 hours if NPO.  Patient not eating much per nurse. To be NPO again in AM. Will follow.  Harvel Ricks RN BSN CDE

## 2013-12-12 ENCOUNTER — Observation Stay (HOSPITAL_COMMUNITY): Payer: BC Managed Care – PPO

## 2013-12-12 DIAGNOSIS — Z8249 Family history of ischemic heart disease and other diseases of the circulatory system: Secondary | ICD-10-CM | POA: Diagnosis not present

## 2013-12-12 DIAGNOSIS — I120 Hypertensive chronic kidney disease with stage 5 chronic kidney disease or end stage renal disease: Secondary | ICD-10-CM | POA: Diagnosis present

## 2013-12-12 DIAGNOSIS — E663 Overweight: Secondary | ICD-10-CM | POA: Diagnosis present

## 2013-12-12 DIAGNOSIS — K219 Gastro-esophageal reflux disease without esophagitis: Secondary | ICD-10-CM | POA: Diagnosis present

## 2013-12-12 DIAGNOSIS — Z9641 Presence of insulin pump (external) (internal): Secondary | ICD-10-CM | POA: Diagnosis not present

## 2013-12-12 DIAGNOSIS — Z7982 Long term (current) use of aspirin: Secondary | ICD-10-CM | POA: Diagnosis not present

## 2013-12-12 DIAGNOSIS — R079 Chest pain, unspecified: Secondary | ICD-10-CM

## 2013-12-12 DIAGNOSIS — R071 Chest pain on breathing: Secondary | ICD-10-CM

## 2013-12-12 DIAGNOSIS — E785 Hyperlipidemia, unspecified: Secondary | ICD-10-CM

## 2013-12-12 DIAGNOSIS — Z87891 Personal history of nicotine dependence: Secondary | ICD-10-CM | POA: Diagnosis not present

## 2013-12-12 DIAGNOSIS — I739 Peripheral vascular disease, unspecified: Secondary | ICD-10-CM | POA: Diagnosis present

## 2013-12-12 DIAGNOSIS — N184 Chronic kidney disease, stage 4 (severe): Secondary | ICD-10-CM

## 2013-12-12 DIAGNOSIS — N185 Chronic kidney disease, stage 5: Secondary | ICD-10-CM | POA: Diagnosis present

## 2013-12-12 DIAGNOSIS — N179 Acute kidney failure, unspecified: Secondary | ICD-10-CM | POA: Diagnosis present

## 2013-12-12 DIAGNOSIS — Z794 Long term (current) use of insulin: Secondary | ICD-10-CM | POA: Diagnosis not present

## 2013-12-12 DIAGNOSIS — I6529 Occlusion and stenosis of unspecified carotid artery: Secondary | ICD-10-CM | POA: Diagnosis present

## 2013-12-12 DIAGNOSIS — I2510 Atherosclerotic heart disease of native coronary artery without angina pectoris: Secondary | ICD-10-CM | POA: Diagnosis present

## 2013-12-12 DIAGNOSIS — E119 Type 2 diabetes mellitus without complications: Secondary | ICD-10-CM | POA: Diagnosis present

## 2013-12-12 LAB — GLUCOSE, CAPILLARY
Glucose-Capillary: 104 mg/dL — ABNORMAL HIGH (ref 70–99)
Glucose-Capillary: 130 mg/dL — ABNORMAL HIGH (ref 70–99)
Glucose-Capillary: 132 mg/dL — ABNORMAL HIGH (ref 70–99)
Glucose-Capillary: 144 mg/dL — ABNORMAL HIGH (ref 70–99)
Glucose-Capillary: 162 mg/dL — ABNORMAL HIGH (ref 70–99)

## 2013-12-12 LAB — CBC
HCT: 29.9 % — ABNORMAL LOW (ref 39.0–52.0)
Hemoglobin: 9.8 g/dL — ABNORMAL LOW (ref 13.0–17.0)
MCH: 27.9 pg (ref 26.0–34.0)
MCHC: 32.8 g/dL (ref 30.0–36.0)
MCV: 85.2 fL (ref 78.0–100.0)
Platelets: 205 10*3/uL (ref 150–400)
RBC: 3.51 MIL/uL — ABNORMAL LOW (ref 4.22–5.81)
RDW: 14.2 % (ref 11.5–15.5)
WBC: 6 10*3/uL (ref 4.0–10.5)

## 2013-12-12 LAB — BASIC METABOLIC PANEL
Anion gap: 14 (ref 5–15)
BUN: 48 mg/dL — ABNORMAL HIGH (ref 6–23)
CO2: 22 mEq/L (ref 19–32)
Calcium: 7.7 mg/dL — ABNORMAL LOW (ref 8.4–10.5)
Chloride: 102 mEq/L (ref 96–112)
Creatinine, Ser: 5.73 mg/dL — ABNORMAL HIGH (ref 0.50–1.35)
GFR calc Af Amer: 11 mL/min — ABNORMAL LOW (ref >90)
GFR calc non Af Amer: 9 mL/min — ABNORMAL LOW (ref >90)
Glucose, Bld: 187 mg/dL — ABNORMAL HIGH (ref 70–99)
Potassium: 4.3 mEq/L (ref 3.7–5.3)
Sodium: 138 mEq/L (ref 137–147)

## 2013-12-12 LAB — URINE MICROSCOPIC-ADD ON

## 2013-12-12 LAB — TROPONIN I: Troponin I: <0.3 ng/mL (ref <0.30)

## 2013-12-12 LAB — URINALYSIS, ROUTINE W REFLEX MICROSCOPIC
Bilirubin Urine: NEGATIVE
Glucose, UA: 250 mg/dL — AB
Ketones, ur: NEGATIVE mg/dL
Leukocytes, UA: NEGATIVE
Nitrite: NEGATIVE
Protein, ur: >300 mg/dL — AB
Specific Gravity, Urine: 1.017 (ref 1.005–1.030)
Urobilinogen, UA: 0.2 mg/dL (ref 0.0–1.0)
pH: 5 (ref 5.0–8.0)

## 2013-12-12 LAB — HEPATIC FUNCTION PANEL
ALT: 11 U/L (ref 0–53)
AST: 13 U/L (ref 0–37)
Albumin: 2.7 g/dL — ABNORMAL LOW (ref 3.5–5.2)
Alkaline Phosphatase: 94 U/L (ref 39–117)
Bilirubin, Direct: <0.2 mg/dL (ref 0.0–0.3)
Total Bilirubin: 0.3 mg/dL (ref 0.3–1.2)
Total Protein: 5.9 g/dL — ABNORMAL LOW (ref 6.0–8.3)

## 2013-12-12 LAB — LIPID PANEL
Cholesterol: 200 mg/dL (ref 0–200)
HDL: 48 mg/dL (ref >39)
LDL Cholesterol: 134 mg/dL — ABNORMAL HIGH (ref 0–99)
Total CHOL/HDL Ratio: 4.2 RATIO
Triglycerides: 90 mg/dL (ref <150)
VLDL: 18 mg/dL (ref 0–40)

## 2013-12-12 MED ORDER — SODIUM CHLORIDE 0.9 % IV SOLN
INTRAVENOUS | Status: DC
  Administered 2013-12-12 – 2013-12-13 (×2): via INTRAVENOUS

## 2013-12-12 MED ORDER — ATORVASTATIN CALCIUM 40 MG PO TABS
40.0000 mg | ORAL_TABLET | Freq: Every day | ORAL | Status: DC
  Administered 2013-12-12: 40 mg via ORAL
  Filled 2013-12-12 (×2): qty 1

## 2013-12-12 MED ORDER — REGADENOSON 0.4 MG/5ML IV SOLN
0.4000 mg | Freq: Once | INTRAVENOUS | Status: AC
  Administered 2013-12-12: 0.4 mg via INTRAVENOUS

## 2013-12-12 MED ORDER — TECHNETIUM TC 99M SESTAMIBI GENERIC - CARDIOLITE
10.0000 | Freq: Once | INTRAVENOUS | Status: AC | PRN
  Administered 2013-12-12: 09:00:00 10 via INTRAVENOUS

## 2013-12-12 MED ORDER — TECHNETIUM TC 99M SESTAMIBI GENERIC - CARDIOLITE
30.0000 | Freq: Once | INTRAVENOUS | Status: AC | PRN
  Administered 2013-12-12: 30 via INTRAVENOUS

## 2013-12-12 MED ORDER — REGADENOSON 0.4 MG/5ML IV SOLN
INTRAVENOUS | Status: AC
Start: 2013-12-12 — End: 2013-12-12
  Filled 2013-12-12: qty 5

## 2013-12-12 NOTE — Progress Notes (Signed)
UR Completed.  Jon Anderson Jane [phone] 12/12/2013  

## 2013-12-12 NOTE — Discharge Summary (Signed)
CARDIOLOGY DISCHARGE SUMMARY   Patient ID: BAYLIE STRUMPF MRN: [id number] DOB/AGE: January 26, 1951 63 y.o.  Admit date: 12/11/2013 Discharge date: 12/13/2013  PCP: [name], MD Primary Cardiologist: Gwenlyn Found  Primary Discharge Diagnosis:   Claudication, lifestyle limiting - s/p successful chocolate balloon angioplasty followed by Lutonix drug eluting balloon angioplasty of the left SFA  Secondary Discharge Diagnosis:    DM   HYPERLIPIDEMIA   Hypertension   Chronic kidney disease (CKD), stage IV (severe)   PVD (peripheral vascular disease)   Anemia   Consults: Nephrology  Procedures: Peripheral vascular catheterization, percutaneous intervention with successful chocolate balloon angioplasty followed by Lutonix drug eluting balloon angioplasty using CO2 and minimal contrast, Lexi scan nuclear stress test    Hospital Course: YARI MESKE is a 63 y.o. male with a history of HTN, DM, HLD, FH CAD, and CKD IV. He was evaluated by Dr. Gwenlyn Found for lifestyle limiting claudication. His ABIs were decreased on the left and he was scheduled for peripheral vascular catheterization. He came to the hospital for the procedure on 09/03.  Full procedure results are below. He had successful percutaneous intervention to the left SFA, reducing the stenosis to less than 10%. He tolerated the procedure well.  He has a history of renal insufficiency and his BUN/creatinine prior to the procedure was 51/5.10. Post-procedure, his labs were checked and his BUN was 45 with a creatinine of 5.11. However, on 09/04, labs were checked again in his BUN was 48 with a creatinine increased to 5.73. He is followed by Dr. Arty Baumgartner as an outpatient and a renal consult was called.  He also had episodes of chest pain, relieved by sublingual nitroglycerin. Because of the extent of his peripheral vascular disease, there was strong concern for coronary artery disease. Cardiac enzymes were cycled but were negative. A  stress test was performed and the results are below. It was negative for scar or ischemia and his EF is 55%. Cardiac risk factor reduction is strongly encouraged, and a lipid profile is below. His LDL is elevated and he is being started on a statin, in addition to being on aspirin, and a beta blocker. Because of his renal insufficiency, no ACE inhibitor or ARB will be used.  We also stopped the lasix.  He will monitor his weight daily and call the office if it increases. .    On 09/05, follow up SCr had decreased to 5.63.  This is two days post cath.  We will redraw lab on Tuesday and he will follow up next week with Dr. Marval Regal.  LEA dopplers in two weeks and FU with Dr. Gwenlyn Found or APP in 3 weeks.  The patient was seen by Dr. Caryl Comes who felt he was stable for DC home.   Labs:   Lab Results  Component Value Date   WBC 6.0 12/12/2013   HGB 9.8* 12/12/2013   HCT 29.9* 12/12/2013   MCV 85.2 12/12/2013   PLT 205 12/12/2013     Recent Labs Lab 12/12/13 0023 12/13/13 0303  NA 138 138  K 4.3 4.0  CL 102 104  CO2 22 22  BUN 48* 54*  CREATININE 5.73* 5.63*  CALCIUM 7.7* 7.7*  PROT 5.9*  --   BILITOT 0.3  --   ALKPHOS 94  --   ALT 11  --   AST 13  --   GLUCOSE 187* 113*    Recent Labs  12/11/13 1243 12/11/13 1820 12/12/13 0023  TROPONINI <0.30 <0.30 <0.30  Lipid Panel     Component Value Date/Time   CHOL 200 12/12/2013 0023   TRIG 90 12/12/2013 0023   HDL 48 12/12/2013 0023   CHOLHDL 4.2 12/12/2013 0023   VLDL 18 12/12/2013 0023   LDLCALC 134* 12/12/2013 0023    Radiology:  Nm Myocar Multi W/spect W/wall Motion / Ef 12/12/2013   CLINICAL DATA:  Chest pain. Diabetes. Hypertension. coronary artery disease.  EXAM: MYOCARDIAL IMAGING WITH SPECT (REST AND PHARMACOLOGIC-STRESS)  GATED LEFT VENTRICULAR WALL MOTION STUDY  LEFT VENTRICULAR EJECTION FRACTION  TECHNIQUE: Standard myocardial SPECT imaging was performed after resting intravenous injection of 10 mCi Tc-19m sestamibi. Subsequently,  intravenous infusion of Lexiscan was performed under the supervision of the Cardiology staff. At peak effect of the drug, 30 mCi Tc-9m sestamibi was injected intravenously and standard myocardial SPECT imaging was performed. Quantitative gated imaging was also performed to evaluate left ventricular wall motion, and estimate left ventricular ejection fraction.  COMPARISON:  None.  FINDINGS: Perfusion: No decreased activity in the left ventricle on stress imaging to suggest reversible ischemia or infarction.  Wall Motion: Mild inferior wall hypokinesis.  Left Ventricular Ejection Fraction: 55 %  End diastolic volume [id number] ml  End systolic volume 58 ml  IMPRESSION: 1. No reversible ischemia or infarction.  2. Mild inferior wall hypokinesis.  3. Left ventricular ejection fraction 55%%  4. Low-risk stress test findings*.  *2012 Appropriate Use Criteria for Coronary Revascularization Focused Update: J Am Coll Cardiol. N6492421. http://content.airportbarriers.com.aspx?articleid=1201161   Electronically Signed   By: Sherryl Barters M.D.   On: 12/12/2013 12:52    Peripheral vascular Cath: 12/11/2013 Angiographic Data:  1: Left SFA-there was a focal 99% stenosis in the left SFA in the adductor canal. This was visualized palmarly with CO2 and ultimately with dilute contrast. Below-the-knee angiography was performed.  IMPRESSION:high-grade distal left SFA stenosis with symptomatic medication. We'll proceed with PTA using chocolate balloon and Lutonix DEB angioplasty  Procedure Description:the patient received 9000 units of heparin intravenously with an ACT of 208. 15 cc of Visipaque dye was used along with 180 cc of CO2. I crossed the bifurcation with a 6 French/55 cm long Ansel sheath over 0.35 Rosen wire and placed it in the proximal left SFA. Following this I crossed the distal left esses SFA lesion with a 0.14/300 cm long Sparta core wire preloaded and a 5 mm x 4 cm chocolate balloon and performed  inflation at 6 atmospheres for 3 minutes. Following this I performed angioplasty using a 5 mm x 60 mm long Lutonix balloon at nominal pressures for 2 minutes. The final angiographic result with reduction of a 99% distal left SFA stenosis to less than 10% residual with small linear non-flow-limiting dissection.  Final Impression: successful chocolate balloon angioplasty followed by Lutonix drug eluting balloon angioplasty using CO2 and minimal contrast for lifestyle and claudication. The patient will be chewed with dual antiplatelet therapy. He'll be hydrated and his renal function will be assessed. She be removed once the ACT falls blows 170 and pressure will be held. He will get followup lower extremity arterial Doppler studies in our [place] office in one week followed by a return office visit on a day that him in the office with a mid-level provider in 2-3 weeks.  EKG:   Echo: None  FOLLOW UP PLANS AND APPOINTMENTS Allergies  Allergen Reactions  . Kiwi Extract Swelling    Facial swelling      Medication List    STOP taking these medications  furosemide 40 MG tablet  Commonly known as:  LASIX      TAKE these medications       amLODipine 5 MG tablet  Commonly known as:  NORVASC  Take 5 mg by mouth daily.     aspirin EC 81 MG tablet  Take 1 tablet (81 mg total) by mouth daily.     atorvastatin 40 MG tablet  Commonly known as:  LIPITOR  Take 1 tablet (40 mg total) by mouth daily at 6 PM.     carvedilol 25 MG tablet  Commonly known as:  COREG  Take 25 mg by mouth 2 (two) times daily with a meal.     cloNIDine 0.3 MG tablet  Commonly known as:  CATAPRES  Take 1 tablet (0.3 mg total) by mouth 2 (two) times daily.     clopidogrel 75 MG tablet  Commonly known as:  PLAVIX  Take 1 tablet (75 mg total) by mouth daily with breakfast.     cyclobenzaprine 10 MG tablet  Commonly known as:  FLEXERIL  Take 10 mg by mouth at bedtime.     esomeprazole 40 MG capsule  Commonly  known as:  NEXIUM  Take 1 capsule (40 mg total) by mouth daily before breakfast.     insulin aspart 100 UNIT/ML injection  Commonly known as:  novoLOG  Inject into the skin continuous. Per insulin pump     insulin pump Soln  by Continuous infusion (non-IV) route. Novolog     nitroGLYCERIN 0.4 MG SL tablet  Commonly known as:  NITROSTAT  Place 1 tablet (0.4 mg total) under the tongue every 5 (five) minutes as needed for chest pain.     VITAMIN D PO  Take 1 tablet by mouth daily.        Discharge Instructions   Diet - low sodium heart healthy    Complete by:  As directed      Discharge instructions    Complete by:  As directed   We have stopped the lasix.  Monitor your weight daily.  If you gain three pounds in 24 hours or five pounds in a week, call the office for instructions.     Increase activity slowly    Complete by:  As directed           Follow-up Information   Follow up with Lorretta Harp, MD. (The office will call you with the appt date and time.)    Specialty:  Cardiology   Contact information:   [address] [place] [address] [phone]       Follow up with Labs On 12/16/2013. (Please go to a lab on Tuesday to have a Basic Metabolic Panel drawn.  The order was placed.)       Follow up with COLADONATO,JOSEPH A, MD. (Call for an appoinment next week if possible. )    Specialty:  Nephrology   Contact information:   [place] [address] [phone]       BRING ALL MEDICATIONS WITH YOU TO FOLLOW UP APPOINTMENTS  Time spent with patient to include physician time: 30 min Signed: Tarri Fuller, [place]  12/13/2013, 10:28 AM Co-Sign MD

## 2013-12-12 NOTE — Progress Notes (Addendum)
Per Dr. Florene Glen, possible discharge in a.m. if renal function is stable/improving. Discharge summary is started under incomplete notes. Patient is aware.

## 2013-12-12 NOTE — Progress Notes (Signed)
Stress test completed. Food and beverage of choice provided. VSS.

## 2013-12-12 NOTE — Progress Notes (Signed)
Patient Name: Jon Anderson Date of Encounter: 12/12/2013  Principal Problem:   Claudication Active Problems:   DM   HYPERLIPIDEMIA   Hypertension   Chronic kidney disease (CKD), stage IV (severe)   PVD (peripheral vascular disease)    Patient Profile: 63 yo male with hx HTN, DM, HLD, FH CAD, CKD IV, was admitted [address] lower extremity angiogram performed for claudication symptoms, ABIs decreased on the left.  SUBJECTIVE: Had chest pain overnight, none now.  OBJECTIVE Filed Vitals:   12/12/13 1041 12/12/13 1043 12/12/13 1045 12/12/13 1203  BP: 170/73 169/73 170/74 179/77  Pulse:  93 90 69  Temp:    97.7 F (36.5 C)  TempSrc:    Oral  Resp:    16  Height:      Weight:      SpO2:    98%    Intake/Output Summary (Last 24 hours) at 12/12/13 1345 Last data filed at 12/12/13 0830  Gross per 24 hour  Intake 1339.35 ml  Output    750 ml  Net 589.35 ml   Filed Weights   12/11/13 0549 12/12/13 0001  Weight: 185 lb (83.915 kg) 187 lb 9.8 oz (85.1 kg)    PHYSICAL EXAM General: Well developed, well nourished, male in no acute distress. Head: Normocephalic, atraumatic.  Neck: Supple without bruits, JVD Not elevated. Lungs:  Resp regular and unlabored, CTA. Heart: RRR, S1, S2, no S3, S4, or murmur; no rub. Abdomen: Soft, non-tender, non-distended, BS + x 4.  Extremities: No clubbing, cyanosis, no edema.  Right groin cath site without ecchymosis, hematoma or bruit. Distal pulses intact Neuro: Alert and oriented X 3. Moves all extremities spontaneously. Psych: Normal affect.  LABS: CBC:  Recent Labs  12/11/13 1243 12/12/13 0023  WBC 8.0 6.0  HGB 10.5* 9.8*  HCT 31.8* 29.9*  MCV 85.0 85.2  PLT 196 [id number]   Basic Metabolic Panel:  Recent Labs  12/11/13 1243 12/12/13 0023  NA 142 138  K 4.1 4.3  CL 106 102  CO2 22 22  GLUCOSE 141* 187*  BUN 45* 48*  CREATININE 5.11* 5.73*  CALCIUM 8.1* 7.7*   Cardiac Enzymes:  Recent Labs  12/11/13 1243  12/11/13 1820 12/12/13 0023  TROPONINI <0.30 <0.30 <0.30   Fasting Lipid Panel:  Recent Labs  12/12/13 0023  CHOL 200  HDL 48  LDLCALC 134*  TRIG 90  CHOLHDL 4.2   TELE:  SR, no significant ectopy  Current Medications:  . amLODipine  5 mg Oral Daily  . aspirin  81 mg Oral Daily  . carvedilol  25 mg Oral BID WC  . cloNIDine  0.3 mg Oral BID  . clopidogrel  75 mg Oral Q breakfast  . cyclobenzaprine  10 mg Oral QHS  . insulin aspart  0-9 Units Subcutaneous 6 times per day  . pantoprazole  80 mg Oral Q1200  . regadenoson       . sodium chloride 50 mL/hr at 12/12/13 0800  . nitroGLYCERIN 10 mcg/min (12/12/13 0700)    ASSESSMENT AND PLAN: Principal Problem:   Claudication - successful chocolate balloon angioplasty followed by Lutonix drug eluting balloon angioplasty using CO2 and minimal contrast to the left SFA  Active Problems:   DM - uses insulin pump    HYPERLIPIDEMIA - C. profile above, LDL is 134, will need statin, we'll check LFTs.    Hypertension - good control on current Rx    Chronic kidney disease (CKD), stage IV (severe) -  Dr. Arty Baumgartner is his renal physician, creatinine increased somewhat post-cath. LV function was normal on dobutamine stress echo in 2013. Continue hydration for now.    PVD (peripheral vascular disease) - see above    Chest pain - patient states has been having episodes more frequently recently, had some overnight with negative troponin and ECG with minimal change from previous ECGs. Stress test ordered for today, follow up on results.   Signed, Rosaria Ferries , PA-C 1:45 PM 12/12/2013  Addendum:  Lexi scan Myoview completed, results below. Nm Myocar Multi W/spect W/wall Motion / Ef 12/12/2013   CLINICAL DATA:  Chest pain. Diabetes. Hypertension. coronary artery disease.  EXAM: MYOCARDIAL IMAGING WITH SPECT (REST AND PHARMACOLOGIC-STRESS)  GATED LEFT VENTRICULAR WALL MOTION STUDY  LEFT VENTRICULAR EJECTION FRACTION  TECHNIQUE: Standard  myocardial SPECT imaging was performed after resting intravenous injection of 10 mCi Tc-56m sestamibi. Subsequently, intravenous infusion of Lexiscan was performed under the supervision of the Cardiology staff. At peak effect of the drug, 30 mCi Tc-64m sestamibi was injected intravenously and standard myocardial SPECT imaging was performed. Quantitative gated imaging was also performed to evaluate left ventricular wall motion, and estimate left ventricular ejection fraction.  COMPARISON:  None.  FINDINGS: Perfusion: No decreased activity in the left ventricle on stress imaging to suggest reversible ischemia or infarction.  Wall Motion: Mild inferior wall hypokinesis.  Left Ventricular Ejection Fraction: 55 %  End diastolic volume [id number] ml  End systolic volume 58 ml  IMPRESSION: 1. No reversible ischemia or infarction.  2. Mild inferior wall hypokinesis.  3. Left ventricular ejection fraction 55%%  4. Low-risk stress test findings*.  *2012 Appropriate Use Criteria for Coronary Revascularization Focused Update: J Am Coll Cardiol. [id number]. http://content.[email or web address].aspx?articleid=1201161   Electronically Signed   By: Sherryl Barters M.D.   On: 12/12/2013 12:52   Renal team contacted, Dr. Florene Glen to see. MD advise further plans. Dr. Florene Glen feels strongly patient should stay overnight, possible discharge in a.m. if renal function stable/improving. Discharge summary is started under incomplete notes.  Rosaria Ferries, PA-C 12/12/2013 1:45 PM Beeper [phone]  I have examined the patient and reviewed assessment and plan and discussed with patient.  Agree with above as stated.  Appreciate renal input.  Low risk stress test today. Possible d/c tomorrow.  Eliyanna Ault S.

## 2013-12-12 NOTE — Progress Notes (Signed)
Lexiscan stress test completed without complication. Some TWI in inferolateral leads at baseline. No CP observed. Pending result by [facility] Radiology.   Hilbert Corrigan PA Pager: [phone]

## 2013-12-12 NOTE — Plan of Care (Signed)
Problem: Phase II Progression Outcomes Goal: Barriers To Progression Addressed/Resolved Outcome: Progressing Elevated creatinine from 5.11 yesterday to today 5.73 resulting in continued hospitalization. IV fluids administered. Patient seen by Dr. Florene Glen, nephrologist, today and will continue to monitor patient as an inpatient, urinalysis ordered and will be sent and plan reviewed with patient. Patient verbalized good understanding.

## 2013-12-12 NOTE — Consult Note (Signed)
Jon Anderson is a  63 year old followed by Dr. Marval Regal for evaluation of CKD  In 3/10 creat was 1.6, 12/13 creat was 2.63, September 16 2013 creat was 4.72.  He was admitted with claudication which limited employment- for angiography with low dose contrast(15cc) and CO2 to treat PVD, distal SFA done yesterday without complication. Creat 9/3 was 5.1 and today 5.73.   He has no n, v or foot pain. There were BP fluctuation periprocedurally sytolic of 161 to 096. Renal is asked to evaluate.    Past Medical History  Diagnosis Date  . Diabetes mellitus   . Hyperlipidemia   . HTN (hypertension)     echo 3/10: EF 60%, LAE  . CAD (coronary artery disease)     a. cath 3/10: oLAD 25%, mLAD 25%; CFX 25%; oDx 80% (small - tx. medically); pRCA 25%, mRCA 25%; OM3 20%;  b.  Myoview 4/11: EF 53%, no scar or ischemia   c. MV 2012 Nl perfusion, apical thinning.  No ischemia or scar.  EF 49%, appears greater by visual estimate.;  d.  Dob stress echo 12/13:  Negative Dob stress echo. There is no evidence of ischemia.  The LVF is normal.  . Chronic chest pain   . GERD (gastroesophageal reflux disease)   . Nephrolithiasis   . CKD (chronic kidney disease), stage IV     a. 05/2012 Renal u/s: medical renal dzs;  b. seen by [facility]  . Carotid stenosis     a. dopplers 05/2012:  0-45% RICA; 40-98% LICA => repeat in 6 mos  . Snores     a. presumed OSA, pt has refused sleep eval in past.  . Peripheral arterial disease     high-grade distal left SFA by duplex ultrasound   Past Surgical History  Procedure Laterality Date  . Tonsillectomy    . Adenoidectomy    . Cardiac catheterization  2001 and 2010   . Angioplasty / stenting femoral Left 12/11/2013    dr berry   Social History:  reports that he has quit smoking. He has never used smokeless tobacco. He reports that he does not drink alcohol or use illicit drugs. Allergies:  Allergies  Allergen Reactions  . Kiwi Extract Swelling    Facial swelling    Family  History  Problem Relation Age of Onset  . Heart attack Sister     died @ 18  . Cancer Mother     died @ 62  . Diabetes Brother     deceased    Medications:  Scheduled: . amLODipine  5 mg Oral Daily  . aspirin  81 mg Oral Daily  . atorvastatin  40 mg Oral q1800  . carvedilol  25 mg Oral BID WC  . cloNIDine  0.3 mg Oral BID  . clopidogrel  75 mg Oral Q breakfast  . cyclobenzaprine  10 mg Oral QHS  . insulin aspart  0-9 Units Subcutaneous 6 times per day  . pantoprazole  80 mg Oral Q1200  . regadenoson        ROS: noncontributory to renal issue  Blood pressure 170/80, pulse 69, temperature 97.9 F (36.6 C), temperature source Oral, resp. rate 18, height '5\' 11"'  (1.803 m), weight 85.1 kg (187 lb 9.8 oz), SpO2 96.00%.  General appearance: alert and cooperative Head: Normocephalic, without obvious abnormality, atraumatic Eyes: negative Ears: normal TM's and external ear canals both ears Resp: clear to auscultation bilaterally Chest wall: no tenderness Cardio: regular rate and rhythm, S1, S2  normal, no murmur, click, rub or gallop GI: soft, non-tender; bowel sounds normal; no masses,  no organomegaly Extremities: extremities normal, atraumatic, no cyanosis or edema Skin: Skin color, texture, turgor normal. No rashes or lesions Neurologic: Grossly normal Results for orders placed during the hospital encounter of 12/11/13 (from the past 48 hour(s))  GLUCOSE, CAPILLARY     Status: None   Collection Time    12/11/13  6:04 AM      Result Value Ref Range   Glucose-Capillary 88  70 - 99 mg/dL   Comment 1 Notify RN     Comment 2 Documented in Chart    POCT ACTIVATED CLOTTING TIME     Status: None   Collection Time    12/11/13  8:17 AM      Result Value Ref Range   Activated Clotting Time 185    POCT ACTIVATED CLOTTING TIME     Status: None   Collection Time    12/11/13  8:44 AM      Result Value Ref Range   Activated Clotting Time 208    GLUCOSE, CAPILLARY     Status:  Abnormal   Collection Time    12/11/13  9:39 AM      Result Value Ref Range   Glucose-Capillary 117 (*) 70 - 99 mg/dL  TROPONIN I     Status: None   Collection Time    12/11/13 12:43 PM      Result Value Ref Range   Troponin I <0.30  <0.30 ng/mL   Comment:            Due to the release kinetics of cTnI,     a negative result within the first hours     of the onset of symptoms does not rule out     myocardial infarction with certainty.     If myocardial infarction is still suspected,     repeat the test at appropriate intervals.  BASIC METABOLIC PANEL     Status: Abnormal   Collection Time    12/11/13 12:43 PM      Result Value Ref Range   Sodium 142  137 - 147 mEq/L   Potassium 4.1  3.7 - 5.3 mEq/L   Chloride 106  96 - 112 mEq/L   CO2 22  19 - 32 mEq/L   Glucose, Bld 141 (*) 70 - 99 mg/dL   BUN 45 (*) 6 - 23 mg/dL   Creatinine, Ser 5.11 (*) 0.50 - 1.35 mg/dL   Calcium 8.1 (*) 8.4 - 10.5 mg/dL   GFR calc non Af Amer 11 (*) >90 mL/min   GFR calc Af Amer 13 (*) >90 mL/min   Comment: (NOTE)     The eGFR has been calculated using the CKD EPI equation.     This calculation has not been validated in all clinical situations.     eGFR's persistently <90 mL/min signify possible Chronic Kidney     Disease.   Anion gap 14  5 - 15  CBC     Status: Abnormal   Collection Time    12/11/13 12:43 PM      Result Value Ref Range   WBC 8.0  4.0 - 10.5 K/uL   RBC 3.74 (*) 4.22 - 5.81 MIL/uL   Hemoglobin 10.5 (*) 13.0 - 17.0 g/dL   HCT 31.8 (*) 39.0 - 52.0 %   MCV 85.0  78.0 - 100.0 fL   MCH 28.1  26.0 - 34.0 pg  MCHC 33.0  30.0 - 36.0 g/dL   RDW 13.8  11.5 - 15.5 %   Platelets 196  150 - 400 K/uL  GLUCOSE, CAPILLARY     Status: Abnormal   Collection Time    12/11/13  2:58 PM      Result Value Ref Range   Glucose-Capillary 117 (*) 70 - 99 mg/dL  POCT ACTIVATED CLOTTING TIME     Status: None   Collection Time    12/11/13  4:02 PM      Result Value Ref Range   Activated Clotting  Time 112    TROPONIN I     Status: None   Collection Time    12/11/13  6:20 PM      Result Value Ref Range   Troponin I <0.30  <0.30 ng/mL   Comment:            Due to the release kinetics of cTnI,     a negative result within the first hours     of the onset of symptoms does not rule out     myocardial infarction with certainty.     If myocardial infarction is still suspected,     repeat the test at appropriate intervals.  GLUCOSE, CAPILLARY     Status: Abnormal   Collection Time    12/11/13  8:17 PM      Result Value Ref Range   Glucose-Capillary 166 (*) 70 - 99 mg/dL   Comment 1 Notify RN    GLUCOSE, CAPILLARY     Status: Abnormal   Collection Time    12/12/13 12:02 AM      Result Value Ref Range   Glucose-Capillary 162 (*) 70 - 99 mg/dL   Comment 1 Notify RN    TROPONIN I     Status: None   Collection Time    12/12/13 12:23 AM      Result Value Ref Range   Troponin I <0.30  <0.30 ng/mL   Comment:            Due to the release kinetics of cTnI,     a negative result within the first hours     of the onset of symptoms does not rule out     myocardial infarction with certainty.     If myocardial infarction is still suspected,     repeat the test at appropriate intervals.  CBC     Status: Abnormal   Collection Time    12/12/13 12:23 AM      Result Value Ref Range   WBC 6.0  4.0 - 10.5 K/uL   RBC 3.51 (*) 4.22 - 5.81 MIL/uL   Hemoglobin 9.8 (*) 13.0 - 17.0 g/dL   HCT 29.9 (*) 39.0 - 52.0 %   MCV 85.2  78.0 - 100.0 fL   MCH 27.9  26.0 - 34.0 pg   MCHC 32.8  30.0 - 36.0 g/dL   RDW 14.2  11.5 - 15.5 %   Platelets 205  150 - 400 K/uL  BASIC METABOLIC PANEL     Status: Abnormal   Collection Time    12/12/13 12:23 AM      Result Value Ref Range   Sodium 138  137 - 147 mEq/L   Potassium 4.3  3.7 - 5.3 mEq/L   Chloride 102  96 - 112 mEq/L   CO2 22  19 - 32 mEq/L   Glucose, Bld 187 (*) 70 - 99 mg/dL   BUN 48 (*)  6 - 23 mg/dL   Creatinine, Ser 5.73 (*) 0.50 - 1.35  mg/dL   Calcium 7.7 (*) 8.4 - 10.5 mg/dL   GFR calc non Af Amer 9 (*) >90 mL/min   GFR calc Af Amer 11 (*) >90 mL/min   Comment: (NOTE)     The eGFR has been calculated using the CKD EPI equation.     This calculation has not been validated in all clinical situations.     eGFR's persistently <90 mL/min signify possible Chronic Kidney     Disease.   Anion gap 14  5 - 15  LIPID PANEL     Status: Abnormal   Collection Time    12/12/13 12:23 AM      Result Value Ref Range   Cholesterol 200  0 - 200 mg/dL   Triglycerides 90  <150 mg/dL   HDL 48  >39 mg/dL   Total CHOL/HDL Ratio 4.2     VLDL 18  0 - 40 mg/dL   LDL Cholesterol 134 (*) 0 - 99 mg/dL   Comment:            Total Cholesterol/HDL:CHD Risk     Coronary Heart Disease Risk Table                         Men   Women      1/2 Average Risk   3.4   3.3      Average Risk       5.0   4.4      2 X Average Risk   9.6   7.1      3 X Average Risk  23.4   11.0                Use the calculated Patient Ratio     above and the CHD Risk Table     to determine the patient's CHD Risk.                ATP III CLASSIFICATION (LDL):      <100     mg/dL   Optimal      100-129  mg/dL   Near or Above                        Optimal      130-159  mg/dL   Borderline      160-189  mg/dL   High      >190     mg/dL   Very High  HEPATIC FUNCTION PANEL     Status: Abnormal   Collection Time    12/12/13 12:23 AM      Result Value Ref Range   Total Protein 5.9 (*) 6.0 - 8.3 g/dL   Albumin 2.7 (*) 3.5 - 5.2 g/dL   AST 13  0 - 37 U/L   ALT 11  0 - 53 U/L   Alkaline Phosphatase 94  39 - 117 U/L   Total Bilirubin 0.3  0.3 - 1.2 mg/dL   Bilirubin, Direct <0.2  0.0 - 0.3 mg/dL   Indirect Bilirubin NOT CALCULATED  0.3 - 0.9 mg/dL  GLUCOSE, CAPILLARY     Status: Abnormal   Collection Time    12/12/13  4:53 AM      Result Value Ref Range   Glucose-Capillary 104 (*) 70 - 99 mg/dL   Comment 1 Notify RN    GLUCOSE, CAPILLARY  Status: Abnormal    Collection Time    12/12/13 12:06 PM      Result Value Ref Range   Glucose-Capillary 130 (*) 70 - 99 mg/dL   Comment 1 Notify RN    GLUCOSE, CAPILLARY     Status: Abnormal   Collection Time    12/12/13  4:02 PM      Result Value Ref Range   Glucose-Capillary 144 (*) 70 - 99 mg/dL   Comment 1 Notify RN     Nm Myocar Multi W/spect W/wall Motion / Ef  12/12/2013   CLINICAL DATA:  Chest pain. Diabetes. Hypertension. coronary artery disease.  EXAM: MYOCARDIAL IMAGING WITH SPECT (REST AND PHARMACOLOGIC-STRESS)  GATED LEFT VENTRICULAR WALL MOTION STUDY  LEFT VENTRICULAR EJECTION FRACTION  TECHNIQUE: Standard myocardial SPECT imaging was performed after resting intravenous injection of 10 mCi Tc-51msestamibi. Subsequently, intravenous infusion of Lexiscan was performed under the supervision of the Cardiology staff. At peak effect of the drug, 30 mCi Tc-990mestamibi was injected intravenously and standard myocardial SPECT imaging was performed. Quantitative gated imaging was also performed to evaluate left ventricular wall motion, and estimate left ventricular ejection fraction.  COMPARISON:  None.  FINDINGS: Perfusion: No decreased activity in the left ventricle on stress imaging to suggest reversible ischemia or infarction.  Wall Motion: Mild inferior wall hypokinesis.  Left Ventricular Ejection Fraction: 55 %  End diastolic volume 13349l  End systolic volume 58 ml  IMPRESSION: 1. No reversible ischemia or infarction.  2. Mild inferior wall hypokinesis.  3. Left ventricular ejection fraction 55%%  4. Low-risk stress test findings*.  *2012 Appropriate Use Criteria for Coronary Revascularization Focused Update: J Am Coll Cardiol. 201791;50(5):697-948http://content.onairportbarriers.comspx?articleid=1201161   Electronically Signed   By: WaSherryl Barters.D.   On: 12/12/2013 12:52    Assessment:  1 CKD 5 with AKI, possibly hemodynamic due to fluctuating BP and less likely contrast  Plan: 1 Follow for  now and manage appropriately  Shalita Notte C 12/12/2013, 4:30 PM

## 2013-12-13 ENCOUNTER — Other Ambulatory Visit: Payer: Self-pay | Admitting: Physician Assistant

## 2013-12-13 DIAGNOSIS — N184 Chronic kidney disease, stage 4 (severe): Secondary | ICD-10-CM

## 2013-12-13 DIAGNOSIS — I739 Peripheral vascular disease, unspecified: Secondary | ICD-10-CM

## 2013-12-13 LAB — RENAL FUNCTION PANEL
Albumin: 2.6 g/dL — ABNORMAL LOW (ref 3.5–5.2)
Anion gap: 12 (ref 5–15)
BUN: 54 mg/dL — ABNORMAL HIGH (ref 6–23)
CO2: 22 mEq/L (ref 19–32)
Calcium: 7.7 mg/dL — ABNORMAL LOW (ref 8.4–10.5)
Chloride: 104 mEq/L (ref 96–112)
Creatinine, Ser: 5.63 mg/dL — ABNORMAL HIGH (ref 0.50–1.35)
GFR calc Af Amer: 11 mL/min — ABNORMAL LOW (ref >90)
GFR calc non Af Amer: 10 mL/min — ABNORMAL LOW (ref >90)
Glucose, Bld: 113 mg/dL — ABNORMAL HIGH (ref 70–99)
Phosphorus: 3.8 mg/dL (ref 2.3–4.6)
Potassium: 4 mEq/L (ref 3.7–5.3)
Sodium: 138 mEq/L (ref 137–147)

## 2013-12-13 LAB — GLUCOSE, CAPILLARY
Glucose-Capillary: 107 mg/dL — ABNORMAL HIGH (ref 70–99)
Glucose-Capillary: 110 mg/dL — ABNORMAL HIGH (ref 70–99)
Glucose-Capillary: 169 mg/dL — ABNORMAL HIGH (ref 70–99)

## 2013-12-13 MED ORDER — ATORVASTATIN CALCIUM 40 MG PO TABS: 40.0000 mg | ORAL_TABLET | Freq: Every day | ORAL | Status: DC

## 2013-12-13 MED ORDER — CLOPIDOGREL BISULFATE 75 MG PO TABS: 75.0000 mg | ORAL_TABLET | Freq: Every day | ORAL | Status: DC

## 2013-12-13 NOTE — Progress Notes (Signed)
Cr seems to have reached its [place] today  F/u w JB F/u labs on Tues

## 2013-12-13 NOTE — Progress Notes (Signed)
Subjective: No CP, SOB.  Leg feels better.   Objective: Vital signs in last 24 hours: Temp:  [97.4 F (36.3 C)-99.4 F (37.4 C)] 99.4 F (37.4 C) (09/05 0406) Pulse Rate:  [53-93] 82 (09/05 0406) Resp:  [16-18] 18 (09/05 0406) BP: (155-180)/(68-95) 180/68 mmHg (09/05 0406) SpO2:  [93 %-98 %] 93 % (09/05 0406) Weight:  [188 lb 0.8 oz (85.3 kg)] 188 lb 0.8 oz (85.3 kg) (09/05 0019) Last BM Date: 12/10/13  Intake/Output from previous day: 09/04 0701 - 09/05 0700 In: 1530 [P.O.:520; I.V.:1010] Out: 900 [Urine:900] Intake/Output this shift:    Medications Current Facility-Administered Medications  Medication Dose Route Frequency Provider Last Rate Last Dose  . 0.9 %  sodium chloride infusion   Intravenous Continuous Evelene Croon Barrett, PA-C 50 mL/hr at 12/12/13 1556    . acetaminophen (TYLENOL) tablet 650 mg  650 mg Oral Q4H PRN Lorretta Harp, MD      . amLODipine (NORVASC) tablet 5 mg  5 mg Oral Daily Lorretta Harp, MD   5 mg at 12/12/13 1240  . aspirin chewable tablet 81 mg  81 mg Oral Daily Lorretta Harp, MD   81 mg at 12/12/13 1240  . atorvastatin (LIPITOR) tablet 40 mg  40 mg Oral q1800 Rhonda G Barrett, PA-C   40 mg at 12/12/13 1800  . carvedilol (COREG) tablet 25 mg  25 mg Oral BID WC Lorretta Harp, MD   25 mg at 12/12/13 1800  . cloNIDine (CATAPRES) tablet 0.3 mg  0.3 mg Oral BID Lorretta Harp, MD   0.3 mg at 12/12/13 2201  . clopidogrel (PLAVIX) tablet 75 mg  75 mg Oral Q breakfast Lorretta Harp, MD   75 mg at 12/12/13 1240  . cyclobenzaprine (FLEXERIL) tablet 10 mg  10 mg Oral QHS Lorretta Harp, MD   10 mg at 12/12/13 2201  . [facility] HOLD] hydrALAZINE (APRESOLINE) injection 10 mg  10 mg Intravenous Q4H PRN Evelene Croon Barrett, PA-C   10 mg at 12/11/13 1615  . insulin aspart (novoLOG) injection 0-9 Units  0-9 Units Subcutaneous 6 times per day Charlie Pitter, PA-C   1 Units at 12/12/13 1700  . morphine 2 MG/ML injection 2 mg  2 mg Intravenous Q2H PRN Cecilie Kicks, NP      . nitroGLYCERIN (NITROSTAT) SL tablet 0.4 mg  0.4 mg Sublingual Q5 min PRN Lorretta Harp, MD   0.4 mg at 12/11/13 1153  . nitroGLYCERIN 50 mg in dextrose 5 % 250 mL (0.2 mg/mL) infusion  10 mcg/min Intravenous Titrated Cecilie Kicks, NP 3 mL/hr at 12/12/13 0700 10 mcg/min at 12/12/13 0700  . ondansetron (ZOFRAN) injection 4 mg  4 mg Intravenous Q6H PRN Lorretta Harp, MD      . oxyCODONE-acetaminophen (PERCOCET/ROXICET) 5-325 MG per tablet 1 tablet  1 tablet Oral Q4H PRN Cecilie Kicks, NP   1 tablet at 12/11/13 1749  . pantoprazole (PROTONIX) EC tablet 80 mg  80 mg Oral Q1200 Lorretta Harp, MD   80 mg at 12/12/13 1243    PE: General appearance: alert, cooperative and no distress Lungs: clear to auscultation bilaterally Heart: regular rate and rhythm, S1, S2 normal, no murmur, click, rub or gallop Extremities: No LEE Pulses: 2+ and symmetric Skin: Warm and dry Neurologic: Grossly normal  Lab Results:   Recent Labs  12/11/13 1243 12/12/13 0023  WBC 8.0 6.0  HGB 10.5* 9.8*  HCT 31.8* 29.9*  PLT  196 205   BMET  Recent Labs  12/11/13 1243 12/12/13 0023 12/13/13 0303  NA 142 138 138  K 4.1 4.3 4.0  CL 106 102 104  CO2 22 22 22   GLUCOSE 141* 187* 113*  BUN 45* 48* 54*  CREATININE 5.11* 5.73* 5.63*  CALCIUM 8.1* 7.7* 7.7*   PT/INR No results found for this basename: LABPROT, INR,  in the last 72 hours Cholesterol  Recent Labs  12/12/13 0023  CHOL 200   Lipid Panel     Component Value Date/Time   CHOL 200 12/12/2013 0023   TRIG 90 12/12/2013 0023   HDL 48 12/12/2013 0023   CHOLHDL 4.2 12/12/2013 0023   VLDL 18 12/12/2013 0023   LDLCALC 134* 12/12/2013 0023     Lipid Panel     Component Value Date/Time   CHOL 200 12/12/2013 0023   TRIG 90 12/12/2013 0023   HDL 48 12/12/2013 0023   CHOLHDL 4.2 12/12/2013 0023   VLDL 18 12/12/2013 0023   LDLCALC 134* 12/12/2013 0023     Assessment/Plan  Principal Problem:   Claudication Active Problems:   DM    HYPERLIPIDEMIA   Hypertension   Chronic kidney disease (CKD), stage IV (severe)   PVD (peripheral vascular disease)  Plan:  SP successful chocolate balloon angioplasty followed by Lutonix drug eluting balloon angioplasty for high-grade distal left SFA stenosis on 12/11/13. Lasix held due to CKD.  ASA, plavix, coreg.   The patient developed CP on 9/3.  Ruled out for MI.  Lexiscan was negative.  EF 55%.  LDL 134 but TG and HDL excellent.  lipitor was added.  Renal following.  SCr elevated but stable. 5.11(adm)>> 5.73>>5.63.  BP has been somewhat labile.  The last several are elevated however.  SP 1L IV fluids.  Recommend increasing amlodipine to 10mg .  DC home and repeat BMET on Monday.     LOS: 2 days    Dorethea Strubel PA-C 12/13/2013 7:11 AM

## 2013-12-15 ENCOUNTER — Emergency Department (HOSPITAL_COMMUNITY): Payer: BC Managed Care – PPO

## 2013-12-15 ENCOUNTER — Observation Stay (HOSPITAL_COMMUNITY)
Admission: EM | Admit: 2013-12-15 | Discharge: 2013-12-16 | Disposition: A | Discharge status: Home / Self Care | Payer: BC Managed Care – PPO | Attending: Internal Medicine | Admitting: Internal Medicine

## 2013-12-15 ENCOUNTER — Encounter (HOSPITAL_COMMUNITY): Payer: Self-pay | Admitting: Emergency Medicine

## 2013-12-15 DIAGNOSIS — Z7982 Long term (current) use of aspirin: Secondary | ICD-10-CM | POA: Insufficient documentation

## 2013-12-15 DIAGNOSIS — I129 Hypertensive chronic kidney disease with stage 1 through stage 4 chronic kidney disease, or unspecified chronic kidney disease: Secondary | ICD-10-CM | POA: Diagnosis not present

## 2013-12-15 DIAGNOSIS — Z794 Long term (current) use of insulin: Secondary | ICD-10-CM | POA: Insufficient documentation

## 2013-12-15 DIAGNOSIS — I2510 Atherosclerotic heart disease of native coronary artery without angina pectoris: Secondary | ICD-10-CM | POA: Diagnosis present

## 2013-12-15 DIAGNOSIS — Z9889 Other specified postprocedural states: Secondary | ICD-10-CM | POA: Diagnosis not present

## 2013-12-15 DIAGNOSIS — Z79899 Other long term (current) drug therapy: Secondary | ICD-10-CM | POA: Insufficient documentation

## 2013-12-15 DIAGNOSIS — R600 Localized edema: Secondary | ICD-10-CM

## 2013-12-15 DIAGNOSIS — Z87891 Personal history of nicotine dependence: Secondary | ICD-10-CM | POA: Diagnosis not present

## 2013-12-15 DIAGNOSIS — G8929 Other chronic pain: Secondary | ICD-10-CM | POA: Insufficient documentation

## 2013-12-15 DIAGNOSIS — I6529 Occlusion and stenosis of unspecified carotid artery: Secondary | ICD-10-CM | POA: Diagnosis not present

## 2013-12-15 DIAGNOSIS — E119 Type 2 diabetes mellitus without complications: Secondary | ICD-10-CM | POA: Diagnosis not present

## 2013-12-15 DIAGNOSIS — M25519 Pain in unspecified shoulder: Principal | ICD-10-CM | POA: Insufficient documentation

## 2013-12-15 DIAGNOSIS — R0989 Other specified symptoms and signs involving the circulatory and respiratory systems: Secondary | ICD-10-CM

## 2013-12-15 DIAGNOSIS — K219 Gastro-esophageal reflux disease without esophagitis: Secondary | ICD-10-CM | POA: Diagnosis not present

## 2013-12-15 DIAGNOSIS — E785 Hyperlipidemia, unspecified: Secondary | ICD-10-CM | POA: Diagnosis present

## 2013-12-15 DIAGNOSIS — I739 Peripheral vascular disease, unspecified: Secondary | ICD-10-CM | POA: Diagnosis present

## 2013-12-15 DIAGNOSIS — R072 Precordial pain: Secondary | ICD-10-CM

## 2013-12-15 DIAGNOSIS — N184 Chronic kidney disease, stage 4 (severe): Secondary | ICD-10-CM | POA: Diagnosis present

## 2013-12-15 DIAGNOSIS — Z7902 Long term (current) use of antithrombotics/antiplatelets: Secondary | ICD-10-CM | POA: Diagnosis not present

## 2013-12-15 DIAGNOSIS — R079 Chest pain, unspecified: Secondary | ICD-10-CM | POA: Diagnosis present

## 2013-12-15 DIAGNOSIS — R0789 Other chest pain: Secondary | ICD-10-CM

## 2013-12-15 DIAGNOSIS — Z87442 Personal history of urinary calculi: Secondary | ICD-10-CM | POA: Insufficient documentation

## 2013-12-15 DIAGNOSIS — R609 Edema, unspecified: Secondary | ICD-10-CM | POA: Insufficient documentation

## 2013-12-15 DIAGNOSIS — I10 Essential (primary) hypertension: Secondary | ICD-10-CM | POA: Diagnosis present

## 2013-12-15 DIAGNOSIS — R9431 Abnormal electrocardiogram [ECG] [EKG]: Secondary | ICD-10-CM | POA: Insufficient documentation

## 2013-12-15 DIAGNOSIS — I219 Acute myocardial infarction, unspecified: Secondary | ICD-10-CM

## 2013-12-15 DIAGNOSIS — M25512 Pain in left shoulder: Secondary | ICD-10-CM | POA: Diagnosis present

## 2013-12-15 DIAGNOSIS — I213 ST elevation (STEMI) myocardial infarction of unspecified site: Secondary | ICD-10-CM | POA: Insufficient documentation

## 2013-12-15 LAB — CBC
HCT: 31.7 % — ABNORMAL LOW (ref 39.0–52.0)
Hemoglobin: 10.7 g/dL — ABNORMAL LOW (ref 13.0–17.0)
MCH: 28.5 pg (ref 26.0–34.0)
MCHC: 33.8 g/dL (ref 30.0–36.0)
MCV: 84.3 fL (ref 78.0–100.0)
Platelets: 184 10*3/uL (ref 150–400)
RBC: 3.76 MIL/uL — ABNORMAL LOW (ref 4.22–5.81)
RDW: 13.5 % (ref 11.5–15.5)
WBC: 6.5 10*3/uL (ref 4.0–10.5)

## 2013-12-15 LAB — BASIC METABOLIC PANEL
Anion gap: 14 (ref 5–15)
BUN: 50 mg/dL — ABNORMAL HIGH (ref 6–23)
CO2: 20 mEq/L (ref 19–32)
Calcium: 8.6 mg/dL (ref 8.4–10.5)
Chloride: 102 mEq/L (ref 96–112)
Creatinine, Ser: 4.9 mg/dL — ABNORMAL HIGH (ref 0.50–1.35)
GFR calc Af Amer: 13 mL/min — ABNORMAL LOW (ref >90)
GFR calc non Af Amer: 11 mL/min — ABNORMAL LOW (ref >90)
Glucose, Bld: 115 mg/dL — ABNORMAL HIGH (ref 70–99)
Potassium: 4.3 mEq/L (ref 3.7–5.3)
Sodium: 136 mEq/L — ABNORMAL LOW (ref 137–147)

## 2013-12-15 LAB — I-STAT TROPONIN, ED: Troponin i, poc: 0.02 ng/mL (ref 0.00–0.08)

## 2013-12-15 LAB — GLUCOSE, CAPILLARY
Glucose-Capillary: 139 mg/dL — ABNORMAL HIGH (ref 70–99)
Glucose-Capillary: 147 mg/dL — ABNORMAL HIGH (ref 70–99)
Glucose-Capillary: 49 mg/dL — ABNORMAL LOW (ref 70–99)
Glucose-Capillary: 66 mg/dL — ABNORMAL LOW (ref 70–99)

## 2013-12-15 LAB — TROPONIN I: Troponin I: <0.3 ng/mL (ref <0.30)

## 2013-12-15 MED ORDER — ACETAMINOPHEN 325 MG PO TABS: 650.0000 mg | ORAL_TABLET | ORAL | Status: DC | PRN

## 2013-12-15 MED ORDER — OXYCODONE-ACETAMINOPHEN 5-325 MG PO TABS
1.0000 | ORAL_TABLET | Freq: Four times a day (QID) | ORAL | Status: DC | PRN
  Administered 2013-12-15 – 2013-12-16 (×2): 1 via ORAL
  Filled 2013-12-15 (×2): qty 1

## 2013-12-15 MED ORDER — ASPIRIN 81 MG PO CHEW
324.0000 mg | CHEWABLE_TABLET | Freq: Once | ORAL | Status: AC
  Administered 2013-12-15: 324 mg via ORAL
  Filled 2013-12-15: qty 4

## 2013-12-15 MED ORDER — HEPARIN (PORCINE) IN NACL 100-0.45 UNIT/ML-% IJ SOLN
1100.0000 [IU]/h | INTRAMUSCULAR | Status: DC
  Administered 2013-12-15: 1050 [IU]/h via INTRAVENOUS
  Filled 2013-12-15 (×2): qty 250

## 2013-12-15 MED ORDER — AMLODIPINE BESYLATE 5 MG PO TABS
5.0000 mg | ORAL_TABLET | Freq: Every day | ORAL | Status: DC
  Administered 2013-12-15 – 2013-12-16 (×2): 5 mg via ORAL
  Filled 2013-12-15 (×2): qty 1

## 2013-12-15 MED ORDER — ASPIRIN EC 81 MG PO TBEC
81.0000 mg | DELAYED_RELEASE_TABLET | Freq: Every day | ORAL | Status: DC
  Administered 2013-12-16: 81 mg via ORAL
  Filled 2013-12-15: qty 1

## 2013-12-15 MED ORDER — CYCLOBENZAPRINE HCL 10 MG PO TABS
10.0000 mg | ORAL_TABLET | Freq: Every day | ORAL | Status: DC
  Filled 2013-12-15 (×2): qty 1

## 2013-12-15 MED ORDER — ONDANSETRON HCL 4 MG/2ML IJ SOLN
4.0000 mg | Freq: Four times a day (QID) | INTRAMUSCULAR | Status: DC | PRN
Start: 2013-12-15 — End: 2013-12-16

## 2013-12-15 MED ORDER — ATORVASTATIN CALCIUM 40 MG PO TABS
40.0000 mg | ORAL_TABLET | Freq: Every day | ORAL | Status: DC
  Administered 2013-12-15: 40 mg via ORAL
  Filled 2013-12-15 (×2): qty 1

## 2013-12-15 MED ORDER — HEPARIN BOLUS VIA INFUSION
4000.0000 [IU] | Freq: Once | INTRAVENOUS | Status: AC
  Administered 2013-12-15: 4000 [IU] via INTRAVENOUS
  Filled 2013-12-15: qty 4000

## 2013-12-15 MED ORDER — INSULIN PUMP
Freq: Three times a day (TID) | SUBCUTANEOUS | Status: DC
  Administered 2013-12-16: 11:00:00 via SUBCUTANEOUS
  Filled 2013-12-15: qty 1

## 2013-12-15 MED ORDER — CLONIDINE HCL 0.3 MG PO TABS
0.3000 mg | ORAL_TABLET | Freq: Two times a day (BID) | ORAL | Status: DC
  Administered 2013-12-15 – 2013-12-16 (×2): 0.3 mg via ORAL
  Filled 2013-12-15 (×3): qty 1

## 2013-12-15 MED ORDER — INSULIN ASPART 100 UNIT/ML ~~LOC~~ SOLN: 0.0000 [IU] | Freq: Three times a day (TID) | SUBCUTANEOUS | Status: DC

## 2013-12-15 MED ORDER — CLOPIDOGREL BISULFATE 75 MG PO TABS
75.0000 mg | ORAL_TABLET | Freq: Every day | ORAL | Status: DC
  Administered 2013-12-15 – 2013-12-16 (×2): 75 mg via ORAL
  Filled 2013-12-15 (×2): qty 1

## 2013-12-15 MED ORDER — PANTOPRAZOLE SODIUM 40 MG PO TBEC
40.0000 mg | DELAYED_RELEASE_TABLET | Freq: Every day | ORAL | Status: DC
Start: 2013-12-15 — End: 2013-12-16
  Administered 2013-12-15 – 2013-12-16 (×2): 40 mg via ORAL
  Filled 2013-12-15 (×2): qty 1

## 2013-12-15 MED ORDER — CARVEDILOL 25 MG PO TABS
25.0000 mg | ORAL_TABLET | Freq: Two times a day (BID) | ORAL | Status: DC
  Administered 2013-12-15 – 2013-12-16 (×2): 25 mg via ORAL
  Filled 2013-12-15 (×4): qty 1

## 2013-12-15 NOTE — ED Notes (Signed)
Spoke with phlebotomy will attempt blood draw.

## 2013-12-15 NOTE — ED Provider Notes (Signed)
CSN: [id number]     Arrival date & time 12/15/13  1614 History   First MD Initiated Contact with Patient 12/15/13 1640     Chief Complaint  Patient presents with  . Shoulder Pain     (Consider location/radiation/quality/duration/timing/severity/associated sxs/prior Treatment) HPI Comments: Patient presents to the ER for evaluation of left shoulder pain. She reports that his symptoms began at least 4 days ago. He thinks he might have hurt himself at work the night before. He did not, however, remote or any specific injury. Patient does report that he recently had multiple tests by his cardiologist. He was evaluated for peripheral vascular disease by Doctor Henrene Pastor. During that workup he also had carotid Dopplers (he reports that there is a blockage) and a cardiac stress test (he reports was normal).  Patient is a 63 y.o. male presenting with shoulder pain.  Shoulder Pain    Past Medical History  Diagnosis Date  . Diabetes mellitus   . Hyperlipidemia   . HTN (hypertension)     echo 3/10: EF 60%, LAE  . CAD (coronary artery disease)     a. cath 3/10: oLAD 25%, mLAD 25%; CFX 25%; oDx 80% (small - tx. medically); pRCA 25%, mRCA 25%; OM3 20%;  b.  Myoview 4/11: EF 53%, no scar or ischemia   c. MV 2012 Nl perfusion, apical thinning.  No ischemia or scar.  EF 49%, appears greater by visual estimate.;  d.  Dob stress echo 12/13:  Negative Dob stress echo. There is no evidence of ischemia.  The LVF is normal.  . Chronic chest pain   . GERD (gastroesophageal reflux disease)   . Nephrolithiasis   . CKD (chronic kidney disease), stage IV     a. 05/2012 Renal u/s: medical renal dzs;  b. seen by [facility]  . Carotid stenosis     a. dopplers 05/2012:  [id number] RICA; [id number] LICA => repeat in 6 mos  . Snores     a. presumed OSA, pt has refused sleep eval in past.  . Peripheral arterial disease     high-grade distal left SFA by duplex ultrasound   Past Surgical History  Procedure Laterality Date  .  Tonsillectomy    . Adenoidectomy    . Cardiac catheterization  2001 and 2010   . Angioplasty / stenting femoral Left 12/11/2013    dr berry   Family History  Problem Relation Age of Onset  . Heart attack Sister     died @ 76  . Cancer Mother     died @ 44  . Diabetes Brother     deceased   History  Substance Use Topics  . Smoking status: Former Research scientist (life sciences)  . Smokeless tobacco: Never Used  . Alcohol Use: No    Review of Systems    Allergies  Kiwi extract  Home Medications   Prior to Admission medications   Medication Sig Start Date End Date Taking? Authorizing Provider  amLODipine (NORVASC) 5 MG tablet Take 5 mg by mouth daily.   Yes Historical Provider, MD  aspirin EC 81 MG tablet Take 1 tablet (81 mg total) by mouth daily. 05/28/12  Yes Scott Joylene Draft, PA-C  atorvastatin (LIPITOR) 40 MG tablet Take 1 tablet (40 mg total) by mouth daily at 6 PM. 12/13/13  Yes Tarri Fuller, PA-C  carvedilol (COREG) 25 MG tablet Take 25 mg by mouth 2 (two) times daily with a meal.   Yes Historical Provider, MD  Cholecalciferol (VITAMIN D PO) Take  1 tablet by mouth daily.    Yes Historical Provider, MD  cloNIDine (CATAPRES) 0.3 MG tablet Take 1 tablet (0.3 mg total) by mouth 2 (two) times daily. 03/20/12  Yes Rhonda G Barrett, PA-C  clopidogrel (PLAVIX) 75 MG tablet Take 1 tablet (75 mg total) by mouth daily with breakfast. 12/13/13  Yes Tarri Fuller, PA-C  cyclobenzaprine (FLEXERIL) 10 MG tablet Take 10 mg by mouth at bedtime.    Yes Historical Provider, MD  esomeprazole (NEXIUM) 40 MG capsule Take 1 capsule (40 mg total) by mouth daily before breakfast. 03/20/12  Yes Rhonda G Barrett, PA-C  insulin aspart (NOVOLOG) 100 UNIT/ML injection Inject into the skin continuous. Per insulin pump 03/20/12  Yes Rhonda G Barrett, PA-C  nitroGLYCERIN (NITROSTAT) 0.4 MG SL tablet Place 1 tablet (0.4 mg total) under the tongue every 5 (five) minutes as needed for chest pain. 03/20/12  Yes Rhonda G Barrett, PA-C   BP  181/81  Pulse 66  Temp(Src) 98.3 F (36.8 C)  Resp 13  SpO2 99% Physical Exam  Constitutional: He is oriented to person, place, and time. He appears well-developed and well-nourished. No distress.  HENT:  Head: Normocephalic and atraumatic.  Right Ear: Hearing normal.  Left Ear: Hearing normal.  Nose: Nose normal.  Mouth/Throat: Oropharynx is clear and moist and mucous membranes are normal.  Eyes: Conjunctivae and EOM are normal. Pupils are equal, round, and reactive to light.  Neck: Normal range of motion. Neck supple.  Cardiovascular: Regular rhythm, S1 normal and S2 normal.  Exam reveals no gallop and no friction rub.   No murmur heard. Pulmonary/Chest: Effort normal and breath sounds normal. No respiratory distress. He exhibits no tenderness.  Abdominal: Soft. Normal appearance and bowel sounds are normal. There is no hepatosplenomegaly. There is no tenderness. There is no rebound, no guarding, no tenderness at McBurney's point and negative Murphy's sign. No hernia.  Musculoskeletal:       Left shoulder: He exhibits decreased range of motion and tenderness.  Neurological: He is alert and oriented to person, place, and time. He has normal strength. No cranial nerve deficit or sensory deficit. Coordination normal. GCS eye subscore is 4. GCS verbal subscore is 5. GCS motor subscore is 6.  Skin: Skin is warm, dry and intact. No rash noted. No cyanosis.  Psychiatric: He has a normal mood and affect. His speech is normal and behavior is normal. Thought content normal.    ED Course  Procedures (including critical care time) Labs Review Labs Reviewed  CBC - Abnormal; Notable for the following:    RBC 3.76 (*)    Hemoglobin 10.7 (*)    HCT 31.7 (*)    All other components within normal limits  BASIC METABOLIC PANEL - Abnormal; Notable for the following:    Sodium 136 (*)    Glucose, Bld 115 (*)    BUN 50 (*)    Creatinine, Ser 4.90 (*)    GFR calc non Af Amer 11 (*)    GFR calc  Af Amer 13 (*)    All other components within normal limits  I-STAT TROPOININ, ED    Imaging Review Dg Shoulder Left  12/15/2013   CLINICAL DATA:  Left shoulder pain.  Injury.  EXAM: LEFT SHOULDER - 2+ VIEW  COMPARISON:  Chest x-ray 12/05/2013  FINDINGS: There is no evidence of fracture or dislocation. There is no evidence of arthropathy or other focal bone abnormality. Soft tissues are unremarkable.  IMPRESSION: Negative.   Electronically Signed  By: Marin Olp M.D.   On: 12/15/2013 17:57     EKG Interpretation   Date/Time:  Monday December 15 2013 16:34:13 EDT Ventricular Rate:  77 PR Interval:  129 QRS Duration: 92 QT Interval:  389 QTC Calculation: 440 R Axis:   97 Text Interpretation:  Sinus rhythm ST elevation consider anterolateral  injury or acute infarct inferior st depression with inverted T waves  Confirmed by POLLINA  MD, CHRISTOPHER [phone]) on 12/15/2013 5:40:38 PM      MDM   Final diagnoses:  None   abnormal EKG  Left shoulder pain, possible anginal equivalent  Patient presents to the ER for evaluation of left shoulder pain. Patient reports persistent pain for approximately 4 days. There is no known injury. Patient has recently been worked up for peripheral vascular disease, had any further medicine stress test 2 days ago which was normal. Patient presents today for increase pain in the left shoulder. EKG at presentation did reveal lateral and anterior ST elevations. Patient's EKG has shown ST elevations in anterior leads previously, lateral elevations are new. Patient also shows new T-wave inversions and ST depressions in inferior leads. Patient has persistent lateral inversion, but new ST depressions in lateral leads. Patient does not appear to be in any acute distress. Presentation is 4 reproducible left shoulder pain. EKG, however, raises concern for possible ST elevation. This was discussed with Doctor Croituru, on call for interventional cardiology. He has  reviewed the images and was decided not to call this an ST elevation MI, obtain enzymes and reevaluate the patient.  First troponin is negative. A repeat EKG shows persistent changes top no worsening or new findings. Discussed once again with Doctor Croituru. He recommends medicine admission for monitoring. Patient has end-stage renal disease, is not a candidate for cardiac catheterization at this time.    Orpah Greek, MD 12/15/13 [phone]

## 2013-12-15 NOTE — H&P (Signed)
PCP:   [name], MD   Chief Complaint:  Shoulder pain  HPI: 63 yo male h/o esrd baseline cr 5 (not on dialysis yet), PVD with recent stent by dr Claiborne Billings for significant claudication, CAD nonobs disease per cath 2012, recent stress testing this past week neg for ischemia with EF over 50%, IDDM, HTN comes in with left shoulder pain for the past 4 days.  He was working in his yard, and hurt his left shoulder.  It is very painful to move and is in the anterior portion of his shoulder left.  Denies any chest pain, no sob.  No le edema or swelling.  A 12 lead ekg was checked on his arrival which revealed new significant changes from last week consisting of st segment elevation in anterior and lateral leads.  As mentioned, pt has had recent stress testing by cardiology team this past Saturday (3 days ago).  Case was discussed with cardiology team, who advised medical management, serial cardiac enzymes at this time.  Pt is still reporting pain in his left shoulder, no chest pain.  Review of Systems:  Positive and negative as per HPI otherwise all other systems are negative  Past Medical History: Past Medical History  Diagnosis Date  . Diabetes mellitus   . Hyperlipidemia   . HTN (hypertension)     echo 3/10: EF 60%, LAE  . CAD (coronary artery disease)     a. cath 3/10: oLAD 25%, mLAD 25%; CFX 25%; oDx 80% (small - tx. medically); pRCA 25%, mRCA 25%; OM3 20%;  b.  Myoview 4/11: EF 53%, no scar or ischemia   c. MV 2012 Nl perfusion, apical thinning.  No ischemia or scar.  EF 49%, appears greater by visual estimate.;  d.  Dob stress echo 12/13:  Negative Dob stress echo. There is no evidence of ischemia.  The LVF is normal.  . Chronic chest pain   . GERD (gastroesophageal reflux disease)   . Nephrolithiasis   . CKD (chronic kidney disease), stage IV     a. 05/2012 Renal u/s: medical renal dzs;  b. seen by [facility]  . Carotid stenosis     a. dopplers 05/2012:  [id number] RICA; [id number] LICA =>  repeat in 6 mos  . Snores     a. presumed OSA, pt has refused sleep eval in past.  . Peripheral arterial disease     high-grade distal left SFA by duplex ultrasound   Past Surgical History  Procedure Laterality Date  . Tonsillectomy    . Adenoidectomy    . Cardiac catheterization  2001 and 2010   . Angioplasty / stenting femoral Left 12/11/2013    dr berry    Medications: Prior to Admission medications   Medication Sig Start Date End Date Taking? Authorizing Provider  amLODipine (NORVASC) 5 MG tablet Take 5 mg by mouth daily.   Yes Historical Provider, MD  aspirin EC 81 MG tablet Take 1 tablet (81 mg total) by mouth daily. 05/28/12  Yes Scott Joylene Draft, PA-C  atorvastatin (LIPITOR) 40 MG tablet Take 1 tablet (40 mg total) by mouth daily at 6 PM. 12/13/13  Yes Tarri Fuller, PA-C  carvedilol (COREG) 25 MG tablet Take 25 mg by mouth 2 (two) times daily with a meal.   Yes Historical Provider, MD  Cholecalciferol (VITAMIN D PO) Take 1 tablet by mouth daily.    Yes Historical Provider, MD  cloNIDine (CATAPRES) 0.3 MG tablet Take 1 tablet (0.3 mg total) by mouth 2 (  two) times daily. 03/20/12  Yes Rhonda G Barrett, PA-C  clopidogrel (PLAVIX) 75 MG tablet Take 1 tablet (75 mg total) by mouth daily with breakfast. 12/13/13  Yes Tarri Fuller, PA-C  cyclobenzaprine (FLEXERIL) 10 MG tablet Take 10 mg by mouth at bedtime.    Yes Historical Provider, MD  esomeprazole (NEXIUM) 40 MG capsule Take 1 capsule (40 mg total) by mouth daily before breakfast. 03/20/12  Yes Rhonda G Barrett, PA-C  insulin aspart (NOVOLOG) 100 UNIT/ML injection Inject into the skin continuous. Per insulin pump 03/20/12  Yes Rhonda G Barrett, PA-C  nitroGLYCERIN (NITROSTAT) 0.4 MG SL tablet Place 1 tablet (0.4 mg total) under the tongue every 5 (five) minutes as needed for chest pain. 03/20/12  Yes Rhonda G Barrett, PA-C    Allergies:   Allergies  Allergen Reactions  . Kiwi Extract Swelling    Facial swelling     Social  History:  reports that he has quit smoking. He has never used smokeless tobacco. He reports that he does not drink alcohol or use illicit drugs.  Family History: Family History  Problem Relation Age of Onset  . Heart attack Sister     died @ 21  . Cancer Mother     died @ 33  . Diabetes Brother     deceased    Physical Exam: Filed Vitals:   12/15/13 1830 12/15/13 1845 12/15/13 1900 12/15/13 1915  BP: 174/88 176/90 185/94 196/96  Pulse: 65 68 71 77  Temp:      Resp: 17 24 20 18   SpO2: 98% 98% 98% 98%   General appearance: alert, cooperative and no distress Head: Normocephalic, without obvious abnormality, atraumatic Eyes: negative Nose: Nares normal. Septum midline. Mucosa normal. No drainage or sinus tenderness. Neck: no JVD and supple, symmetrical, trachea midline Lungs: clear to auscultation bilaterally Heart: regular rate and rhythm, S1, S2 normal, no murmur, click, rub or gallop Abdomen: soft, non-tender; bowel sounds normal; no masses,  no organomegaly Extremities: extremities normal, atraumatic, no cyanosis or edema  Left shoulder with reproducible pain with palpation to anterior shoulder, left shoulder with decreased ROM and pain with both active and passive movement with interior rotation, no swelling noted, no bony/musculature abnormalities noted. Pulses: 2+ and symmetric Skin: Skin color, texture, turgor normal. No rashes or lesions Neurologic: Grossly normal   Labs on Admission:   Recent Labs  12/13/13 0303 12/15/13 1622  NA 138 136*  K 4.0 4.3  CL 104 102  CO2 22 20  GLUCOSE 113* 115*  BUN 54* 50*  CREATININE 5.63* 4.90*  CALCIUM 7.7* 8.6  PHOS 3.8  --     Recent Labs  12/13/13 0303  ALBUMIN 2.6*    Recent Labs  12/15/13 1622  WBC 6.5  HGB 10.7*  HCT 31.7*  MCV 84.3  PLT 184   Radiological Exams on Admission: Nm Myocar Multi W/spect W/wall Motion / Ef  12/12/2013   CLINICAL DATA:  Chest pain. Diabetes. Hypertension. coronary artery  disease.  EXAM: MYOCARDIAL IMAGING WITH SPECT (REST AND PHARMACOLOGIC-STRESS)  GATED LEFT VENTRICULAR WALL MOTION STUDY  LEFT VENTRICULAR EJECTION FRACTION  TECHNIQUE: Standard myocardial SPECT imaging was performed after resting intravenous injection of 10 mCi Tc-9m sestamibi. Subsequently, intravenous infusion of Lexiscan was performed under the supervision of the Cardiology staff. At peak effect of the drug, 30 mCi Tc-77m sestamibi was injected intravenously and standard myocardial SPECT imaging was performed. Quantitative gated imaging was also performed to evaluate left ventricular wall motion, and estimate  left ventricular ejection fraction.  COMPARISON:  None.  FINDINGS: Perfusion: No decreased activity in the left ventricle on stress imaging to suggest reversible ischemia or infarction.  Wall Motion: Mild inferior wall hypokinesis.  Left Ventricular Ejection Fraction: 55 %  End diastolic volume [id number] ml  End systolic volume 58 ml  IMPRESSION: 1. No reversible ischemia or infarction.  2. Mild inferior wall hypokinesis.  3. Left ventricular ejection fraction 55%%  4. Low-risk stress test findings*.  *2012 Appropriate Use Criteria for Coronary Revascularization Focused Update: J Am Coll Cardiol. [id number]. http://content.airportbarriers.com.aspx?articleid=1201161   Electronically Signed   By: Sherryl Barters M.D.   On: 12/12/2013 12:52   Dg Shoulder Left  12/15/2013   CLINICAL DATA:  Left shoulder pain.  Injury.  EXAM: LEFT SHOULDER - 2+ VIEW  COMPARISON:  Chest x-ray 12/05/2013  FINDINGS: There is no evidence of fracture or dislocation. There is no evidence of arthropathy or other focal bone abnormality. Soft tissues are unremarkable.  IMPRESSION: Negative.   Electronically Signed   By: Marin Olp M.D.   On: 12/15/2013 17:57    Assessment/Plan  63 yo male with esrd/CAD/htn/iddm/hld/pvd with possible stemi presented with left shoulder pain (believe unrelated to his ekg changes and msk  injury)  Principal Problem:   Possible STEMI (ST elevation myocardial infarction)-  Cardiology team has been called and will see pt in consultation, pt well known to their service.  Place on hep gtt for now until evaluated by cards team.  Cont plavix/coreg/statin/aspirin.    Active Problems:  Stable unless o/w noted   HYPERLIPIDEMIA   Hypertension   CAD (coronary artery disease) nonobstructive per cath 2012   Chronic kidney disease (CKD), stage IV (severe)   PVD (peripheral vascular disease)   Shoulder pain, left    obs on tele.  Full code.  Mithran Strike A 12/15/2013, 7:33 PM

## 2013-12-15 NOTE — Progress Notes (Addendum)
ANTICOAGULATION CONSULT NOTE - Initial Consult  Pharmacy Consult for heparin Indication: chest pain/ACS  Allergies  Allergen Reactions  . Kiwi Extract Swelling    Facial swelling     Patient Measurements: Height: 5\' 10"  (177.8 cm) Weight: 188 lb 0.8 oz (85.3 kg) IBW/kg (Calculated) : 73 Heparin Dosing Weight: 85kg  Vital Signs: Temp: 98.3 F (36.8 C) (09/07 1622) BP: 196/96 mmHg (09/07 1915) Pulse Rate: 77 (09/07 1915)  Labs:  Recent Labs  12/13/13 0303 12/15/13 1622  HGB  --  10.7*  HCT  --  31.7*  PLT  --  184  CREATININE 5.63* 4.90*    Estimated Creatinine Clearance: 15.9 ml/min (by C-G formula based on Cr of 4.9).   Medical History: Past Medical History  Diagnosis Date  . Diabetes mellitus   . Hyperlipidemia   . HTN (hypertension)     echo 3/10: EF 60%, LAE  . CAD (coronary artery disease)     a. cath 3/10: oLAD 25%, mLAD 25%; CFX 25%; oDx 80% (small - tx. medically); pRCA 25%, mRCA 25%; OM3 20%;  b.  Myoview 4/11: EF 53%, no scar or ischemia   c. MV 2012 Nl perfusion, apical thinning.  No ischemia or scar.  EF 49%, appears greater by visual estimate.;  d.  Dob stress echo 12/13:  Negative Dob stress echo. There is no evidence of ischemia.  The LVF is normal.  . Chronic chest pain   . GERD (gastroesophageal reflux disease)   . Nephrolithiasis   . CKD (chronic kidney disease), stage IV     a. 05/2012 Renal u/s: medical renal dzs;  b. seen by [facility]  . Carotid stenosis     a. dopplers 05/2012:  [id number] RICA; [id number] LICA => repeat in 6 mos  . Snores     a. presumed OSA, pt has refused sleep eval in past.  . Peripheral arterial disease     high-grade distal left SFA by duplex ultrasound    Assessment: 33 YOM with hx HTN, familial CAD and PVD. Seen with left shoulder pain and EKG changes. Note he had a stress test done recently which was negative for ischemia. Not on anticoagulation PTA. Baseline hgb 10.7 with platelets 184. No bleeding noted.  Goal  of Therapy:  Heparin level 0.3-0.7 units/ml Monitor platelets by anticoagulation protocol: Yes   Plan:  1. Heparin bolus with 4000 units IV x1 2. Start heparin drip at 1050 units/hr 3. First heparin level in 8 hours d/t renal function 4. Daily HL and CBC 5. Follow for cardiology work-up  Sascha Palma D. Micharl Helmes, PharmD, BCPS Clinical Pharmacist Pager: [phone] 12/15/2013 8:22 PM

## 2013-12-15 NOTE — ED Notes (Signed)
Pt here for a heart cath a few days ago and now is having left shoulder pain radiating down arm. Denies injury. sts did stress test Saturday.

## 2013-12-15 NOTE — ED Notes (Signed)
2 Nurses attempted blood draw and IV start twice each unsuccessful.  Paged and spoke with IV team will attempt IV start.

## 2013-12-15 NOTE — Consult Note (Signed)
Reason for Consult: EKG changes and left arm pain Referring Physician: ER  Jon Anderson is an 63 y.o. male.  HPI:   Jon Anderson is a 63 y.o. male with a history of HTN, DM, HLD, FH CAD, and CKD IV.  Cardiac cath 3/10: oLAD 25%, mLAD 25%; CFX 25%; oDx 80% (small - tx. medically); pRCA 25%, mRCA 25%; OM3 20%.  He was evaluated by Dr. Gwenlyn Found recently for lifestyle limiting claudication. His ABIs were decreased on the left and he was scheduled for peripheral vascular catheterization. He came to the hospital for the procedure on 09/03.  He had successful percutaneous intervention to the left SFA, reducing the stenosis to less than 10%. He tolerated the procedure well.  He has a history of renal insufficiency and his BUN/creatinine prior to the procedure was 51/5.10. Post-procedure, his labs were checked and his BUN was 45 with a creatinine of 5.11. However, on 09/04, labs were checked again in his BUN was 48 with a creatinine increased to 5.73. He is followed by Dr. Arty Baumgartner as an outpatient and a renal consult was called.   He also had episodes of chest pain, relieved by sublingual nitroglycerin. Because of the extent of his peripheral vascular disease, there was strong concern for coronary artery disease. Cardiac enzymes were cycled but were negative. A stress test was performed and was negative for scar or ischemia with an EF is 55%.  His LDL is elevated and was being started on a statin, in addition to being on aspirin, and a beta blocker. Because of his renal insufficiency, no ACE inhibitor or ARB will be used.  He was asked to monitor his weight daily and call the office if it increases. Marland Kitchen   He presented to with left shoulder pain after working in the yard.  Because of EKG changes, we were asked to consult.  The patient currently denies nausea, vomiting, fever, chest pain, shortness of breath, orthopnea, dizziness, PND, cough, congestion, abdominal pain, hematochezia, melena.  He does  report some unilateral LEE.    Past Medical History  Diagnosis Date  . Diabetes mellitus   . Hyperlipidemia   . HTN (hypertension)     echo 3/10: EF 60%, LAE  . CAD (coronary artery disease)     a. cath 3/10: oLAD 25%, mLAD 25%; CFX 25%; oDx 80% (small - tx. medically); pRCA 25%, mRCA 25%; OM3 20%;  b.  Myoview 4/11: EF 53%, no scar or ischemia   c. MV 2012 Nl perfusion, apical thinning.  No ischemia or scar.  EF 49%, appears greater by visual estimate.;  d.  Dob stress echo 12/13:  Negative Dob stress echo. There is no evidence of ischemia.  The LVF is normal.  . Chronic chest pain   . GERD (gastroesophageal reflux disease)   . Nephrolithiasis   . CKD (chronic kidney disease), stage IV     a. 05/2012 Renal u/s: medical renal dzs;  b. seen by [facility]  . Carotid stenosis     a. dopplers 05/2012:  6-78% RICA; 93-81% LICA => repeat in 6 mos  . Snores     a. presumed OSA, pt has refused sleep eval in past.  . Peripheral arterial disease     high-grade distal left SFA by duplex ultrasound    Past Surgical History  Procedure Laterality Date  . Tonsillectomy    . Adenoidectomy    . Cardiac catheterization  2001 and 2010   . Angioplasty /  stenting femoral Left 12/11/2013    dr berry    Family History  Problem Relation Age of Onset  . Heart attack Sister     died @ 25  . Cancer Mother     died @ 84  . Diabetes Brother     deceased    Social History:  reports that he has quit smoking. He has never used smokeless tobacco. He reports that he does not drink alcohol or use illicit drugs.  Allergies:  Allergies  Allergen Reactions  . Kiwi Extract Swelling    Facial swelling     Medications:  Prior to Admission medications   Medication Sig Start Date End Date Taking? Authorizing Provider  amLODipine (NORVASC) 5 MG tablet Take 5 mg by mouth daily.   Yes Historical Provider, MD  aspirin EC 81 MG tablet Take 1 tablet (81 mg total) by mouth daily. 05/28/12  Yes Scott Joylene Draft, PA-C  atorvastatin (LIPITOR) 40 MG tablet Take 1 tablet (40 mg total) by mouth daily at 6 PM. 12/13/13  Yes Tarri Fuller, PA-C  carvedilol (COREG) 25 MG tablet Take 25 mg by mouth 2 (two) times daily with a meal.   Yes Historical Provider, MD  Cholecalciferol (VITAMIN D PO) Take 1 tablet by mouth daily.    Yes Historical Provider, MD  cloNIDine (CATAPRES) 0.3 MG tablet Take 1 tablet (0.3 mg total) by mouth 2 (two) times daily. 03/20/12  Yes Rhonda G Barrett, PA-C  clopidogrel (PLAVIX) 75 MG tablet Take 1 tablet (75 mg total) by mouth daily with breakfast. 12/13/13  Yes Tarri Fuller, PA-C  cyclobenzaprine (FLEXERIL) 10 MG tablet Take 10 mg by mouth at bedtime.    Yes Historical Provider, MD  esomeprazole (NEXIUM) 40 MG capsule Take 1 capsule (40 mg total) by mouth daily before breakfast. 03/20/12  Yes Rhonda G Barrett, PA-C  insulin aspart (NOVOLOG) 100 UNIT/ML injection Inject into the skin continuous. Per insulin pump 03/20/12  Yes Rhonda G Barrett, PA-C  nitroGLYCERIN (NITROSTAT) 0.4 MG SL tablet Place 1 tablet (0.4 mg total) under the tongue every 5 (five) minutes as needed for chest pain. 03/20/12  Yes Evelene Croon Barrett, PA-C     Results for orders placed during the hospital encounter of 12/15/13 (from the past 48 hour(s))  CBC     Status: Abnormal   Collection Time    12/15/13  4:22 PM      Result Value Ref Range   WBC 6.5  4.0 - 10.5 K/uL   RBC 3.76 (*) 4.22 - 5.81 MIL/uL   Hemoglobin 10.7 (*) 13.0 - 17.0 g/dL   HCT 31.7 (*) 39.0 - 52.0 %   MCV 84.3  78.0 - 100.0 fL   MCH 28.5  26.0 - 34.0 pg   MCHC 33.8  30.0 - 36.0 g/dL   RDW 13.5  11.5 - 15.5 %   Platelets 184  150 - 400 K/uL  BASIC METABOLIC PANEL     Status: Abnormal   Collection Time    12/15/13  4:22 PM      Result Value Ref Range   Sodium 136 (*) 137 - 147 mEq/L   Potassium 4.3  3.7 - 5.3 mEq/L   Chloride 102  96 - 112 mEq/L   CO2 20  19 - 32 mEq/L   Glucose, Bld 115 (*) 70 - 99 mg/dL   BUN 50 (*) 6 - 23 mg/dL    Creatinine, Ser 4.90 (*) 0.50 - 1.35 mg/dL   Calcium  8.6  8.4 - 10.5 mg/dL   GFR calc non Af Amer 11 (*) >90 mL/min   GFR calc Af Amer 13 (*) >90 mL/min   Comment: (NOTE)     The eGFR has been calculated using the CKD EPI equation.     This calculation has not been validated in all clinical situations.     eGFR's persistently <90 mL/min signify possible Chronic Kidney     Disease.   Anion gap 14  5 - 15  I-STAT TROPOININ, ED     Status: None   Collection Time    12/15/13  5:53 PM      Result Value Ref Range   Troponin i, poc 0.02  0.00 - 0.08 ng/mL   Comment 3            Comment: Due to the release kinetics of cTnI,     a negative result within the first hours     of the onset of symptoms does not rule out     myocardial infarction with certainty.     If myocardial infarction is still suspected,     repeat the test at appropriate intervals.    Dg Shoulder Left  12/15/2013   CLINICAL DATA:  Left shoulder pain.  Injury.  EXAM: LEFT SHOULDER - 2+ VIEW  COMPARISON:  Chest x-ray 12/05/2013  FINDINGS: There is no evidence of fracture or dislocation. There is no evidence of arthropathy or other focal bone abnormality. Soft tissues are unremarkable.  IMPRESSION: Negative.   Electronically Signed   By: Marin Olp M.D.   On: 12/15/2013 17:57    Review of Systems  All other systems reviewed and are negative.  Blood pressure 196/96, pulse 77, temperature 98.3 F (36.8 C), resp. rate 18, SpO2 98.00%. Physical Exam Well nourished, well developed, in no acute distress HEENT: Pupils are equal round react to light accommodation extraocular movements are intact.  Neck: no JVDNo cervical lymphadenopathy. MS:  Left shoulder tender to palpation Cardiac: Regular rate and rhythm without murmurs rubs or gallops. Lungs:  clear to auscultation bilaterally, no wheezing, rhonchi or rales Abd: soft, nontender, positive bowel sounds all quadrants, no hepatosplenomegaly Ext: left lower extremity edema.   2+ radial and dorsalis pedis pulses. Skin: warm and dry Neuro:  Grossly normalAssessment/Plan:  Assessment and Plan  Active Problems:   HYPERLIPIDEMIA   Hypertension   CAD (coronary artery disease) nonobstructive per cath 2012   Chronic kidney disease (CKD), stage IV (severe)   PVD (peripheral vascular disease)   Shoulder pain, left   Chest pain  Plan:  MD to see.    Tarri Fuller, [place] 12/15/2013, 7:47 PM   I have seen and examined the patient along with Tarri Fuller, PA.  I have reviewed the chart, notes and new data.  I agree with PA's note.  Key new complaints: his shoulder pain is clearly musculoskeletal, probably bicipital tendinitis Key examination changes: he has mild asymmetrical swelling of the left calf and foot; no JVD/rales/S3 or other signs of HF Key new findings / data: ECG is grossly abnormal and quite different from previous ECGs. There is subtle ST elevation in I and aVL (new) and more obvious ST elevation in V1-V3 (old and likely LVH-related). There is also markedly worsened  downsloping ST depression in the inferior leads. Very recent normal nuclear stress test (just 3 days ago, ECG changes new since then). Known nonobstructive CAD by cath 2010. Creatinine 6.0  PLAN: His symptoms are benign, but his  abnormal ECG and left leg swelling require further evaluation. Admit, recheck enzymes, duplex US left leg. Forego cath due to severe chronic kidney disease.  Sanda Klein, MD, [place] [phone] 12/15/2013, 8:10 PM

## 2013-12-15 NOTE — ED Notes (Signed)
IV team at bedside 

## 2013-12-15 NOTE — ED Notes (Signed)
Cardiology at bedside.

## 2013-12-16 ENCOUNTER — Encounter (HOSPITAL_COMMUNITY): Payer: Self-pay | Admitting: *Deleted

## 2013-12-16 DIAGNOSIS — M7989 Other specified soft tissue disorders: Secondary | ICD-10-CM

## 2013-12-16 DIAGNOSIS — I739 Peripheral vascular disease, unspecified: Secondary | ICD-10-CM

## 2013-12-16 DIAGNOSIS — N184 Chronic kidney disease, stage 4 (severe): Secondary | ICD-10-CM | POA: Diagnosis not present

## 2013-12-16 DIAGNOSIS — R9431 Abnormal electrocardiogram [ECG] [EKG]: Secondary | ICD-10-CM

## 2013-12-16 DIAGNOSIS — E785 Hyperlipidemia, unspecified: Secondary | ICD-10-CM | POA: Diagnosis not present

## 2013-12-16 DIAGNOSIS — I219 Acute myocardial infarction, unspecified: Secondary | ICD-10-CM | POA: Diagnosis not present

## 2013-12-16 DIAGNOSIS — M25519 Pain in unspecified shoulder: Secondary | ICD-10-CM | POA: Diagnosis not present

## 2013-12-16 LAB — GLUCOSE, CAPILLARY: Glucose-Capillary: 79 mg/dL (ref 70–99)

## 2013-12-16 LAB — BASIC METABOLIC PANEL
Anion gap: 13 (ref 5–15)
BUN: 49 mg/dL — ABNORMAL HIGH (ref 6–23)
CO2: 21 mEq/L (ref 19–32)
Calcium: 8.2 mg/dL — ABNORMAL LOW (ref 8.4–10.5)
Chloride: 101 mEq/L (ref 96–112)
Creatinine, Ser: 5.07 mg/dL — ABNORMAL HIGH (ref 0.50–1.35)
GFR calc Af Amer: 13 mL/min — ABNORMAL LOW (ref >90)
GFR calc non Af Amer: 11 mL/min — ABNORMAL LOW (ref >90)
Glucose, Bld: 143 mg/dL — ABNORMAL HIGH (ref 70–99)
Potassium: 4.1 mEq/L (ref 3.7–5.3)
Sodium: 135 mEq/L — ABNORMAL LOW (ref 137–147)

## 2013-12-16 LAB — CBC
HCT: 29.3 % — ABNORMAL LOW (ref 39.0–52.0)
Hemoglobin: 9.7 g/dL — ABNORMAL LOW (ref 13.0–17.0)
MCH: 27.9 pg (ref 26.0–34.0)
MCHC: 33.1 g/dL (ref 30.0–36.0)
MCV: 84.2 fL (ref 78.0–100.0)
Platelets: 196 10*3/uL (ref 150–400)
RBC: 3.48 MIL/uL — ABNORMAL LOW (ref 4.22–5.81)
RDW: 13.5 % (ref 11.5–15.5)
WBC: 7.5 10*3/uL (ref 4.0–10.5)

## 2013-12-16 LAB — HEMOGLOBIN A1C
Hgb A1c MFr Bld: 7.1 % — ABNORMAL HIGH (ref <5.7)
Mean Plasma Glucose: 157 mg/dL — ABNORMAL HIGH (ref <117)

## 2013-12-16 LAB — TROPONIN I
Troponin I: <0.3 ng/mL (ref <0.30)
Troponin I: <0.3 ng/mL (ref <0.30)

## 2013-12-16 LAB — HEPARIN LEVEL (UNFRACTIONATED)
Heparin Unfractionated: 0.44 IU/mL (ref 0.30–0.70)
Heparin Unfractionated: 0.32 IU/mL (ref 0.30–0.70)

## 2013-12-16 MED ORDER — HYDROCODONE-ACETAMINOPHEN 5-325 MG PO TABS: 1.0000 | ORAL_TABLET | Freq: Four times a day (QID) | ORAL | Status: DC | PRN

## 2013-12-16 NOTE — Progress Notes (Signed)
Utilization review completed.  

## 2013-12-16 NOTE — Discharge Summary (Signed)
Physician Discharge Summary  Jon Anderson [id number] DOB: [birth year] DOA: 12/15/2013  PCP: [name], MD  Admit date: 12/15/2013 Discharge date: 12/16/2013  Time spent: 40 minutes  Recommendations for Outpatient Follow-up:  1. Follow up with [name].  Discharge Diagnoses:  Active Problems:   HYPERLIPIDEMIA   Hypertension   CAD (coronary artery disease) nonobstructive per cath 2012   Chronic kidney disease (CKD), stage IV (severe)   PVD (peripheral vascular disease)   Shoulder pain, left   Chest pain   Discharge Condition: stable  Diet recommendation: renal   Filed Weights   12/15/13 2002 12/15/13 2031 12/16/13 0533  Weight: 85.3 kg (188 lb 0.8 oz) 83.099 kg (183 lb 3.2 oz) 85.3 kg (188 lb 0.8 oz)    History of present illness:  63 yo male h/o esrd baseline cr 5 (not on dialysis yet), PVD with recent stent by dr Claiborne Billings for significant claudication, CAD nonobs disease per cath 2012, recent stress testing this past week neg for ischemia with EF over 50%, IDDM, HTN comes in with left shoulder pain for the past 4 days. He was working in his yard, and hurt his left shoulder. It is very painful to move and is in the anterior portion of his shoulder left. Denies any chest pain, no sob. No le edema or swelling. A 12 lead ekg was checked on his arrival which revealed new significant changes from last week consisting of st segment elevation in anterior and lateral leads. As mentioned, pt has had recent stress testing by cardiology team this past Saturday (3 days ago). Case was discussed with cardiology team, who advised medical management, serial cardiac enzymes at this time. Pt is still reporting pain in his left shoulder, no chest pain.   Hospital Course:  Shoulder pain, left  - MSK.  - Cont Norco.  NSTEMI  - Started heparin, statin, coreg, asa and betablocker.  - Appriciate cardiology assistance.nonobstructive per cath 2012.  - stress test on 9.4.2015: No reversible  ischemia or infarction. 2. Mild inferior wall hypokinesis. 3. Left ventricular ejection fraction 55%% 4. Low-risk stress test findings. - pt refused cardiac cath, markers negative.  PVD (peripheral vascular disease): - doppler of lower ext negative for DVT's.  Chronic kidney disease (CKD), stage IV (severe):  - stable.  Hypertension  - improved, cont beta blocker and clonidine.  Procedures:  Doppler lower ext  Consultations:  cardiology  Discharge Exam: Filed Vitals:   12/16/13 0812  BP: 157/82  Pulse: 74  Temp:   Resp:     General: See progress note  Discharge Instructions You were cared for by a hospitalist during your hospital stay. If you have any questions about your discharge medications or the care you received while you were in the hospital after you are discharged, you can call the unit and asked to speak with the hospitalist on call if the hospitalist that took care of you is not available. Once you are discharged, your primary care physician will handle any further medical issues. Please note that NO REFILLS for any discharge medications will be authorized once you are discharged, as it is imperative that you return to your primary care physician (or establish a relationship with a primary care physician if you do not have one) for your aftercare needs so that they can reassess your need for medications and monitor your lab values.  Discharge Instructions   Diet - low sodium heart healthy    Complete by:  As directed  Increase activity slowly    Complete by:  As directed           Current Discharge Medication List    CONTINUE these medications which have NOT CHANGED   Details  amLODipine (NORVASC) 5 MG tablet Take 5 mg by mouth daily.    aspirin EC 81 MG tablet Take 1 tablet (81 mg total) by mouth daily.   Associated Diagnoses: Coronary atherosclerosis of unspecified type of vessel, native or graft    atorvastatin (LIPITOR) 40 MG tablet Take 1 tablet  (40 mg total) by mouth daily at 6 PM. Qty: 30 tablet, Refills: 5    carvedilol (COREG) 25 MG tablet Take 25 mg by mouth 2 (two) times daily with a meal.    Cholecalciferol (VITAMIN D PO) Take 1 tablet by mouth daily.     cloNIDine (CATAPRES) 0.3 MG tablet Take 1 tablet (0.3 mg total) by mouth 2 (two) times daily. Qty: 30 tablet, Refills: 11    clopidogrel (PLAVIX) 75 MG tablet Take 1 tablet (75 mg total) by mouth daily with breakfast. Qty: 30 tablet, Refills: 11    cyclobenzaprine (FLEXERIL) 10 MG tablet Take 10 mg by mouth at bedtime.     esomeprazole (NEXIUM) 40 MG capsule Take 1 capsule (40 mg total) by mouth daily before breakfast. Qty: 30 capsule, Refills: 3    insulin aspart (NOVOLOG) 100 UNIT/ML injection Inject into the skin continuous. Per insulin pump    nitroGLYCERIN (NITROSTAT) 0.4 MG SL tablet Place 1 tablet (0.4 mg total) under the tongue every 5 (five) minutes as needed for chest pain. Qty: 25 tablet, Refills: 3       Allergies  Allergen Reactions  . Kiwi Extract Swelling    Facial swelling       The results of significant diagnostics from this hospitalization (including imaging, microbiology, ancillary and laboratory) are listed below for reference.    Significant Diagnostic Studies: Dg Chest 2 View  12/05/2013   CLINICAL DATA:  Leg pain and swelling; hypertension  EXAM: CHEST  2 VIEW  COMPARISON:  November 14, 2012  FINDINGS: There is no edema or consolidation. The heart size and pulmonary vascularity are normal. No adenopathy. No bone lesions.  IMPRESSION: No abnormality noted.   Electronically Signed   By: Lowella Grip M.D.   On: 12/05/2013 08:47   Nm Myocar Multi W/spect W/wall Motion / Ef  12/12/2013   CLINICAL DATA:  Chest pain. Diabetes. Hypertension. coronary artery disease.  EXAM: MYOCARDIAL IMAGING WITH SPECT (REST AND PHARMACOLOGIC-STRESS)  GATED LEFT VENTRICULAR WALL MOTION STUDY  LEFT VENTRICULAR EJECTION FRACTION  TECHNIQUE: Standard myocardial  SPECT imaging was performed after resting intravenous injection of 10 mCi Tc-84m sestamibi. Subsequently, intravenous infusion of Lexiscan was performed under the supervision of the Cardiology staff. At peak effect of the drug, 30 mCi Tc-30m sestamibi was injected intravenously and standard myocardial SPECT imaging was performed. Quantitative gated imaging was also performed to evaluate left ventricular wall motion, and estimate left ventricular ejection fraction.  COMPARISON:  None.  FINDINGS: Perfusion: No decreased activity in the left ventricle on stress imaging to suggest reversible ischemia or infarction.  Wall Motion: Mild inferior wall hypokinesis.  Left Ventricular Ejection Fraction: 55 %  End diastolic volume [id number]  End systolic volume 58 ml  IMPRESSION: 1. No reversible ischemia or infarction.  2. Mild inferior wall hypokinesis.  3. Left ventricular ejection fraction 55%%  4. Low-risk stress test findings*.  *2012 Appropriate Use Criteria for Coronary Revascularization Focused  Update: J Am Coll Cardiol. [id number]. http://content.airportbarriers.com.aspx?articleid=[email or web address]   Electronically Signed   By: Sherryl Barters M.D.   On: 12/12/2013 12:52   Dg Shoulder Left  12/15/2013   CLINICAL DATA:  Left shoulder pain.  Injury.  EXAM: LEFT SHOULDER - 2+ VIEW  COMPARISON:  Chest x-ray 12/05/2013  FINDINGS: There is no evidence of fracture or dislocation. There is no evidence of arthropathy or other focal bone abnormality. Soft tissues are unremarkable.  IMPRESSION: Negative.   Electronically Signed   By: Marin Olp M.D.   On: 12/15/2013 17:57    Microbiology: No results found for this or any previous visit (from the past 240 hour(s)).   Labs: Basic Metabolic Panel:  Recent Labs Lab 12/11/13 1243 12/12/13 0023 12/13/13 0303 12/15/13 1622  NA 142 138 138 136*  K 4.1 4.3 4.0 4.3  CL 106 102 104 102  CO2 22 22 22 20   GLUCOSE 141* 187* 113* 115*  BUN 45* 48* 54* 50*  CREATININE  5.11* 5.73* 5.63* 4.90*  CALCIUM 8.1* 7.7* 7.7* 8.6  PHOS  --   --  3.8  --    Liver Function Tests:  Recent Labs Lab 12/12/13 0023 12/13/13 0303  AST 13  --   ALT 11  --   ALKPHOS 94  --   BILITOT 0.3  --   PROT 5.9*  --   ALBUMIN 2.7* 2.6*   No results found for this basename: LIPASE, AMYLASE,  in the last 168 hours No results found for this basename: AMMONIA,  in the last 168 hours CBC:  Recent Labs Lab 12/11/13 1243 12/12/13 0023 12/15/13 1622 12/16/13 0216  WBC 8.0 6.0 6.5 7.5  HGB 10.5* 9.8* 10.7* 9.7*  HCT 31.8* 29.9* 31.7* 29.3*  MCV 85.0 85.2 84.3 84.2  PLT 196 205 184 196   Cardiac Enzymes:  Recent Labs Lab 12/11/13 1243 12/11/13 1820 12/12/13 0023 12/15/13 2133 12/16/13 0216  TROPONINI <0.30 <0.30 <0.30 <0.30 <0.30   BNP: BNP (last 3 results) No results found for this basename: PROBNP,  in the last 8760 hours CBG:  Recent Labs Lab 12/15/13 2053 12/15/13 2121 12/15/13 2152 12/15/13 2352 12/16/13 0736  GLUCAP 49* 66* 139* 147* 79       Signed:  FELIZ ORTIZ, ABRAHAM  [facility] 12/16/2013, 9:21 AM

## 2013-12-16 NOTE — Progress Notes (Signed)
*  PRELIMINARY RESULTS* Vascular Ultrasound Left lower extremity venous duplex has been completed.  Preliminary findings: No evidence of DVT  Landry Mellow, RDMS, RVT  12/16/2013, 11:31 AM

## 2013-12-16 NOTE — Progress Notes (Signed)
[place] for heparin Indication: chest pain/ACS  Allergies  Allergen Reactions  . Kiwi Extract Swelling    Facial swelling     Patient Measurements: Height: 5\' 10"  (177.8 cm) Weight: 183 lb 3.2 oz (83.099 kg) IBW/kg (Calculated) : 73 Heparin Dosing Weight: 85kg  Vital Signs: Temp: 97.9 F (36.6 C) (09/07 2031) Temp src: Oral (09/07 2031) BP: 191/96 mmHg (09/07 2031) Pulse Rate: 74 (09/07 2031)  Labs:  Recent Labs  12/15/13 1622 12/15/13 2133 12/16/13 0216  HGB 10.7*  --  9.7*  HCT 31.7*  --  29.3*  PLT 184  --  196  HEPARINUNFRC  --   --  0.44  CREATININE 4.90*  --   --   TROPONINI  --  <0.30 <0.30    Estimated Creatinine Clearance: 15.9 ml/min (by C-G formula based on Cr of 4.9).  Assessment: 63 yo male with possible ACS for heparin  Goal of Therapy:  Heparin level 0.3-0.7 units/ml Monitor platelets by anticoagulation protocol: Yes   Plan:  Continue Heparin at current rate  Follow-up am labs to verify  Phillis Knack, PharmD, BCPS  12/16/2013 3:13 AM

## 2013-12-16 NOTE — Progress Notes (Addendum)
[facility] PROGRESS NOTE Interim History: 63 yo male h/o esrd baseline cr 5 (not on dialysis yet), PVD with recent stent by dr Claiborne Billings for significant claudication, CAD nonobs disease per cath 2012, recent stress testing this past week neg for ischemia with EF over 50%, IDDM, HTN comes in with left shoulder pain for the past 4 days. He was working in his yard, and hurt his left shoulder. It is very painful to move and is in the anterior portion of his shoulder left    Assessment/Plan: Shoulder pain, left - MSK.  NSTEMI - On heparin, statin, coreg, asa and betablocker. - Appriciate cardiology assistance.nonobstructive per cath 2012. - stress test on 9.4.2015: No reversible ischemia or infarction. 2. Mild inferior wall hypokinesis. 3. Left ventricular ejection fraction 55%% 4. Low-risk stress test findings  PVD (peripheral vascular disease) - doppler of lower ext pending.  Chronic kidney disease (CKD), stage IV (severe): - stable  Hypertension - improved, cont beta blocker and clonidine.    Code Status: full Family Communication: none  Disposition Plan: inpatient   Consultants:  cardiology  Procedures:  CXR  Antibiotics:  None  HPI/Subjective: Does not want cardiac cath  Objective: Filed Vitals:   12/15/13 2002 12/15/13 2031 12/16/13 0533 12/16/13 0812  BP:  191/96 178/87 157/82  Pulse:  74 72 74  Temp:  97.9 F (36.6 C) 98.6 F (37 C)   TempSrc:  Oral Oral   Resp:  18 18   Height: 5\' 10"  (1.778 m)     Weight: 85.3 kg (188 lb 0.8 oz) 83.099 kg (183 lb 3.2 oz) 85.3 kg (188 lb 0.8 oz)   SpO2:  98% 97%     Intake/Output Summary (Last 24 hours) at 12/16/13 0851 Last data filed at 12/16/13 0700  Gross per 24 hour  Intake 199.26 ml  Output    950 ml  Net -750.74 ml   Filed Weights   12/15/13 2002 12/15/13 2031 12/16/13 0533  Weight: 85.3 kg (188 lb 0.8 oz) 83.099 kg (183 lb 3.2 oz) 85.3 kg (188 lb 0.8 oz)    Exam:  General: Alert, awake,  oriented x3, in no acute distress.  HEENT: No bruits, no goiter.  Heart: Regular rate and rhythm. Lungs: Good air movement, clear Abdomen: Soft, nontender, nondistended, positive bowel sounds.    Data Reviewed: Basic Metabolic Panel:  Recent Labs Lab 12/11/13 1243 12/12/13 0023 12/13/13 0303 12/15/13 1622  NA 142 138 138 136*  K 4.1 4.3 4.0 4.3  CL 106 102 104 102  CO2 22 22 22 20   GLUCOSE 141* 187* 113* 115*  BUN 45* 48* 54* 50*  CREATININE 5.11* 5.73* 5.63* 4.90*  CALCIUM 8.1* 7.7* 7.7* 8.6  PHOS  --   --  3.8  --    Liver Function Tests:  Recent Labs Lab 12/12/13 0023 12/13/13 0303  AST 13  --   ALT 11  --   ALKPHOS 94  --   BILITOT 0.3  --   PROT 5.9*  --   ALBUMIN 2.7* 2.6*   No results found for this basename: LIPASE, AMYLASE,  in the last 168 hours No results found for this basename: AMMONIA,  in the last 168 hours CBC:  Recent Labs Lab 12/11/13 1243 12/12/13 0023 12/15/13 1622 12/16/13 0216  WBC 8.0 6.0 6.5 7.5  HGB 10.5* 9.8* 10.7* 9.7*  HCT 31.8* 29.9* 31.7* 29.3*  MCV 85.0 85.2 84.3 84.2  PLT 196 205 184 196   Cardiac Enzymes:  Recent Labs Lab 12/11/13 1243 12/11/13 1820 12/12/13 0023 12/15/13 2133 12/16/13 0216  TROPONINI <0.30 <0.30 <0.30 <0.30 <0.30   BNP (last 3 results) No results found for this basename: PROBNP,  in the last 8760 hours CBG:  Recent Labs Lab 12/15/13 2053 12/15/13 2121 12/15/13 2152 12/15/13 2352 12/16/13 0736  GLUCAP 49* 66* 139* 147* 79    No results found for this or any previous visit (from the past 240 hour(s)).   Studies: Dg Shoulder Left  12/15/2013   CLINICAL DATA:  Left shoulder pain.  Injury.  EXAM: LEFT SHOULDER - 2+ VIEW  COMPARISON:  Chest x-ray 12/05/2013  FINDINGS: There is no evidence of fracture or dislocation. There is no evidence of arthropathy or other focal bone abnormality. Soft tissues are unremarkable.  IMPRESSION: Negative.   Electronically Signed   By: Marin Olp M.D.   On:  12/15/2013 17:57    Scheduled Meds: . amLODipine  5 mg Oral Daily  . aspirin EC  81 mg Oral Daily  . atorvastatin  40 mg Oral q1800  . carvedilol  25 mg Oral BID WC  . cloNIDine  0.3 mg Oral BID  . clopidogrel  75 mg Oral Daily  . cyclobenzaprine  10 mg Oral QHS  . insulin aspart  0-9 Units Subcutaneous TID WC  . insulin pump   Subcutaneous TID AC, HS, 0200  . pantoprazole  40 mg Oral Daily   Continuous Infusions: . heparin 1,050 Units/hr (12/15/13 2230)     Charlynne Cousins  [facility] Pager [phone]. If 8PM-8AM, please contact night-coverage at [email or web address], password [facility] 12/16/2013, 8:51 AM  LOS: 1 day      **Disclaimer: This note may have been dictated with voice recognition software. Similar sounding words can inadvertently be transcribed and this note may contain transcription errors which may not have been corrected upon publication of note.**

## 2013-12-16 NOTE — Progress Notes (Signed)
ANTICOAGULATION CONSULT NOTE - Follow Up Consult  Pharmacy Consult for heparin Indication: chest pain/ACS  Allergies  Allergen Reactions  . Kiwi Extract Swelling    Facial swelling     Patient Measurements: Height: 5\' 10"  (177.8 cm) Weight: 188 lb 0.8 oz (85.3 kg) IBW/kg (Calculated) : 73 Heparin Dosing Weight: 85kg  Vital Signs: Temp: 98.6 F (37 C) (09/08 0533) Temp src: Oral (09/08 0533) BP: 157/82 mmHg (09/08 0812) Pulse Rate: 74 (09/08 0812)  Labs:  Recent Labs  12/15/13 1622 12/15/13 2133 12/16/13 0216 12/16/13 0923  HGB 10.7*  --  9.7*  --   HCT 31.7*  --  29.3*  --   PLT 184  --  196  --   HEPARINUNFRC  --   --  0.44 0.32  CREATININE 4.90*  --   --  5.07*  TROPONINI  --  <0.30 <0.30 <0.30    Estimated Creatinine Clearance: 15.4 ml/min (by C-G formula based on Cr of 5.07).  Assessment: 23 YOM with hx HTN, familial CAD and PVD. Seen with left shoulder pain and EKG changes. Note he had a stress test done recently which was negative for ischemia. Not on anticoagulation PTA. Cardiology not planning on a cath as patient with severe renal disease. Heparin therapeutic x2, though second level did decrease to 0.32 units/mL. Hgb dropped slightly, plts stable. No bleeding noted.  Goal of Therapy:  Heparin level 0.3-0.7 units/ml Monitor platelets by anticoagulation protocol: Yes   Plan:  1. Increase heparin drip slightly to 1100 units/hr 2. Daily HL and CBC 3. Follow for s/s bleeding and d/c planning  Jaxton Casale D. Jovi Alvizo, PharmD, BCPS Clinical Pharmacist Pager: [phone] 12/16/2013 10:41 AM

## 2013-12-16 NOTE — Progress Notes (Signed)
Patient Name: Jon Anderson Date of Encounter: 12/16/2013  Principal Problem:   STEMI (ST elevation myocardial infarction) Active Problems:   HYPERLIPIDEMIA   Hypertension   CAD (coronary artery disease) nonobstructive per cath 2012   Chronic kidney disease (CKD), stage IV (severe)   PVD (peripheral vascular disease)   Shoulder pain, left   Chest pain    Patient Profile: Jon Anderson is a 63 y.o. male with a history of HTN, DM, HLD, FH CAD, and CKD IV. Cardiac cath 3/10: oLAD 25%, mLAD 25%; CFX 25%; oDx 80% (small - tx. medically); pRCA 25%, mRCA 25%; OM3 20%. Admitted 09/03 for PV PCI, had chest pain post-procedure, nuc stress w/out scar/ischemia, d/c 09/04. Had L arm/shoulder pain w/ exertion, abnl ECG and LLE edema. Was admitted 09/07, cards consulted.   SUBJECTIVE: Left arm is sore when he raises it laterally against resistance. Also has SOB with minimal exertion. Notes edema has improved.  OBJECTIVE Filed Vitals:   12/15/13 1915 12/15/13 2002 12/15/13 2031 12/16/13 0533  BP: 196/96  191/96 178/87  Pulse: 77  74 72  Temp:   97.9 F (36.6 C) 98.6 F (37 C)  TempSrc:   Oral Oral  Resp: 18  18 18   Height:  5\' 10"  (1.778 m)    Weight:  188 lb 0.8 oz (85.3 kg) 183 lb 3.2 oz (83.099 kg) 188 lb 0.8 oz (85.3 kg)  SpO2: 98%  98% 97%    Intake/Output Summary (Last 24 hours) at 12/16/13 0720 Last data filed at 12/16/13 [id number]  Gross per 24 hour  Intake  84.88 ml  Output    950 ml  Net -865.12 ml   Filed Weights   12/15/13 2002 12/15/13 2031 12/16/13 0533  Weight: 188 lb 0.8 oz (85.3 kg) 183 lb 3.2 oz (83.099 kg) 188 lb 0.8 oz (85.3 kg)    PHYSICAL EXAM General: Well developed, well nourished, male in no acute distress. Head: Normocephalic, atraumatic.  Neck: Supple without bruits, JVD not elevated. Lungs:  Resp regular and unlabored, CTA. Heart: RRR, S1, S2, no S3, S4, or murmur; no rub. Abdomen: Soft, non-tender, non-distended, BS + x 4.  Extremities:  No clubbing, cyanosis, trace pedal edema. Good distal LLE pulses, calf not tender Neuro: Alert and oriented X 3. Moves all extremities spontaneously. Psych: Normal affect.  LABS: CBC:  Recent Labs  12/15/13 1622 12/16/13 0216  WBC 6.5 7.5  HGB 10.7* 9.7*  HCT 31.7* 29.3*  MCV 84.3 84.2  PLT 184 [id number]   Basic Metabolic Panel:  Recent Labs  12/15/13 1622  NA 136*  K 4.3  CL 102  CO2 20  GLUCOSE 115*  BUN 50*  CREATININE 4.90*  CALCIUM 8.6   Cardiac Enzymes:  Recent Labs  12/15/13 2133 12/16/13 0216  TROPONINI <0.30 <0.30    Recent Labs  12/15/13 1753  TROPIPOC 0.02   Hemoglobin A1C:  Recent Labs  12/15/13 2133  HGBA1C 7.1*   TELE: SR, occ PVCs       ECG: 12/16/2013 SR, Q waves in III & AvF, new from yesterday  Radiology/Studies: Dg Shoulder Left 12/15/2013   CLINICAL DATA:  Left shoulder pain.  Injury.  EXAM: LEFT SHOULDER - 2+ VIEW  COMPARISON:  Chest x-ray 12/05/2013  FINDINGS: There is no evidence of fracture or dislocation. There is no evidence of arthropathy or other focal bone abnormality. Soft tissues are unremarkable.  IMPRESSION: Negative.   Electronically Signed   By: Marin Olp M.D.  On: 12/15/2013 17:57   Current Medications:  . amLODipine  5 mg Oral Daily  . aspirin EC  81 mg Oral Daily  . atorvastatin  40 mg Oral q1800  . carvedilol  25 mg Oral BID WC  . cloNIDine  0.3 mg Oral BID  . clopidogrel  75 mg Oral Daily  . cyclobenzaprine  10 mg Oral QHS  . insulin aspart  0-9 Units Subcutaneous TID WC  . insulin pump   Subcutaneous TID AC, HS, 0200  . pantoprazole  40 mg Oral Daily   . heparin 1,050 Units/hr (12/15/13 2230)    ASSESSMENT AND PLAN:    Abnormal EKG: ECG is abnl on admission, MD to review. He has inferior Q waves (?new). Continue ASA, BB, heparin, statin. Eval per MD, he is not NPO as he is reluctant to undergo dye study due to renal issues    HYPERLIPIDEMIA - statin started last admit, per IM    Hypertension - per  IM, consider increasing amlodipine to 10 mg daily    CAD (coronary artery disease) nonobstructive per cath 2012 - see above    Chronic kidney disease (CKD), stage IV (severe) - follow daily, per IM.    PVD (peripheral vascular disease) - good distal pulses post PCI, follow, continue ASA, Plavix    Shoulder pain, left - per IM    Chest pain - per IM, see above.  Signed, Rosaria Ferries , PA-C 7:20 AM 12/16/2013  I have personally seen and examined this patient with Rosaria Ferries, PA-C.  I agree with the assessment and plan as outlined above. He has atypical pain, mostly left shoulder and c/w musculoskeletal etiology. Troponin is negative. His EKG last night had subtle ST changes but EKG this am is grossly unchanged from EKG on 12/12/13. Stress test within last week without ischemia. With no objective evidence of ischemia and negative cardiac markers, would favor conservative approach to this patient given renal insufficiency. Would avoid cardiac cath at this time if possible. Should be ok for discharge home from cardiac standpoint.   Paiten Boies 12/16/2013 9:05 AM

## 2013-12-18 ENCOUNTER — Telehealth (HOSPITAL_COMMUNITY): Payer: Self-pay | Admitting: *Deleted

## 2013-12-18 NOTE — Telephone Encounter (Signed)
I called this patient to schedule his LEA. He states that he is due to go back to work on Monday and needs to know if Dr. Gwenlyn Found is going to allow that. Please Call

## 2013-12-19 ENCOUNTER — Encounter: Payer: Self-pay | Admitting: *Deleted

## 2013-12-19 ENCOUNTER — Ambulatory Visit (HOSPITAL_COMMUNITY)
Admission: RE | Admit: 2013-12-19 | Discharge: 2013-12-19 | Disposition: A | Discharge status: Home / Self Care | Payer: BC Managed Care – PPO | Source: Ambulatory Visit | Attending: Cardiology | Admitting: Cardiology

## 2013-12-19 DIAGNOSIS — Z9861 Coronary angioplasty status: Secondary | ICD-10-CM | POA: Diagnosis present

## 2013-12-19 DIAGNOSIS — I739 Peripheral vascular disease, unspecified: Secondary | ICD-10-CM | POA: Diagnosis not present

## 2013-12-19 DIAGNOSIS — I70219 Atherosclerosis of native arteries of extremities with intermittent claudication, unspecified extremity: Secondary | ICD-10-CM

## 2013-12-19 NOTE — Telephone Encounter (Signed)
Dr [name], please advise 

## 2013-12-19 NOTE — Progress Notes (Signed)
Left Lower Ext. Arterial Duplex Completed. Oda Cogan, BS, RDMS, RVT

## 2013-12-19 NOTE — Telephone Encounter (Signed)
Voice order from Dr Gwenlyn Found that patient can return to work without restrictions.  Letter created and given to patient.

## 2013-12-23 ENCOUNTER — Telehealth: Payer: Self-pay | Admitting: Cardiovascular Disease

## 2013-12-23 NOTE — Telephone Encounter (Signed)
I spoke with patient and reassured him about the blood flow down the leg.  The dopplers looked great.  The patient says that the leg pain occurs after he has been up on it for a while (6 hours +) at work.  He reports minor swelling.  No color change.  I advised patient to continue to monitor and maybe try support hose.  He voiced understanding and will call back with any new or worsening symptoms.

## 2013-12-23 NOTE — Telephone Encounter (Signed)
Returned call to patient no answer.LMTC. 

## 2013-12-23 NOTE — Telephone Encounter (Signed)
Pt called in stating that Dr. Gwenlyn Found did a procedure on him last week to put an artery in his left leg . He stated that he has returned to work and is still having a lot of pain in his leg. He wants to know what he should do. Please call  Thanks

## 2013-12-23 NOTE — Telephone Encounter (Signed)
Received call from patient he stated he had a pv cath last week.Stated he is still having pain in left leg.Stated pain no worse, but no better.Stated he has appointment to see Cecilie Kicks NP, would like a sooner appointment.Message sent to Dr.[name]'s nurse Brita Romp RN.

## 2014-01-09 ENCOUNTER — Telehealth: Payer: Self-pay | Admitting: *Deleted

## 2014-01-09 NOTE — Telephone Encounter (Signed)
Pt. Came into office and wanted disability papers filled out, papers given to [employer]

## 2014-01-13 ENCOUNTER — Encounter: Payer: Self-pay | Admitting: Cardiology

## 2014-01-13 ENCOUNTER — Telehealth: Payer: Self-pay | Admitting: *Deleted

## 2014-01-13 ENCOUNTER — Ambulatory Visit (INDEPENDENT_AMBULATORY_CARE_PROVIDER_SITE_OTHER): Payer: BC Managed Care – PPO | Admitting: Cardiology

## 2014-01-13 VITALS — BP 169/95 | HR 69 | Ht 71.0 in | Wt 182.3 lb

## 2014-01-13 DIAGNOSIS — I739 Peripheral vascular disease, unspecified: Secondary | ICD-10-CM

## 2014-01-13 DIAGNOSIS — E785 Hyperlipidemia, unspecified: Secondary | ICD-10-CM

## 2014-01-13 DIAGNOSIS — I10 Essential (primary) hypertension: Secondary | ICD-10-CM

## 2014-01-13 DIAGNOSIS — N184 Chronic kidney disease, stage 4 (severe): Secondary | ICD-10-CM

## 2014-01-13 DIAGNOSIS — I2510 Atherosclerotic heart disease of native coronary artery without angina pectoris: Secondary | ICD-10-CM

## 2014-01-13 NOTE — Assessment & Plan Note (Signed)
Lipid Panel     Component Value Date/Time   CHOL 200 12/12/2013 0023   TRIG 90 12/12/2013 0023   HDL 48 12/12/2013 0023   CHOLHDL 4.2 12/12/2013 0023   VLDL 18 12/12/2013 0023   LDLCALC 134* 12/12/2013 0023   followed by PCP

## 2014-01-13 NOTE — Telephone Encounter (Signed)
Short term disability papers filled out and signed by Dr Gwenlyn Found.  Forms faxed to [employer] [phone]

## 2014-01-13 NOTE — Patient Instructions (Signed)
Your physician wants you to follow-up in: 6 months with Dr.[name].Schedule Lower Extremity Dopplers 1 week before Dr.[name]'s appointment. You will receive a reminder letter in the mail two months in advance. If you don't receive a letter, please call our office to schedule the follow-up appointment.

## 2014-01-13 NOTE — Assessment & Plan Note (Signed)
stable °

## 2014-01-13 NOTE — Assessment & Plan Note (Addendum)
Recent PCI 12/11/13 for lifestyle limiting claudication successful chocolate balloon angioplasty followed by Lutonix drug eluting balloon angioplasty using CO2 and minimal contrast for lifestyle and claudication. To Lt SFA.  ABis have improved the symptoms remain.  Under very discussed with the patient recommended possible back pain as the source of the pain he is currently having.   But .the left SFA is open.

## 2014-01-13 NOTE — Assessment & Plan Note (Signed)
a.  cath 3/10: oLAD 25%, mLAD 25%/30%; CFX 25%; oDx 80% (small - tx. medically); pRCA 25%, mRCA 25%; OM3 20%;     b.  Myoview 4/11: EF 53%, no scar or ischemia C. Neg nuc 12/13/13  Recent abnormal EKG which has returned to normal.  No further chest pain.

## 2014-01-13 NOTE — Assessment & Plan Note (Signed)
To followup with renal next week will have labs this week per renal. Creatinine 5.2

## 2014-01-13 NOTE — Progress Notes (Signed)
01/13/2014   PCP: [name], MD   Chief Complaint  Patient presents with  . Follow-up    pt denies chest pain and sob. pt states that he does have swelling in his feet    Primary Cardiologist:Dr. Adora Fridge   HPI:  63 yo male h/o esrd baseline cr 5 (not on dialysis yet), PVD with recent successful chocolate balloon angioplasty followed by Lutonix drug eluting balloon angioplasty using CO2 and minimal contrast for lifestyle and claudication.   by Dr. Gwenlyn Found.  He has hx of CAD nonobs disease per cath 2012, with recent PV PCI pt developed chest pain with neg. Troponin and stress testing -neg for ischemia with EF over 50.  Other history of IDDM and HTN .  He was readmitted 12/15/13 with left shoulder pain for 4 days. He was working in his yard, and hurt his left shoulder. It was very painful to move and is in the anterior portion of his shoulder left. Denied any chest pain, no sob. No le edema or swelling. A 12 lead ekg was checked on his arrival which revealed new significant changes from last week consisting of st segment elevation in anterior and lateral leads. As mentioned, pt has had recent stress testing by cardiology team this past Saturday (3 days ago). Case was discussed with cardiology team, who advised medical management, serial cardiac enzymes at this time. Pt is still reporting pain in his left shoulder, no chest pain.  cardiac enzymes were negative and pain resolved.   Today pt is back for f/u of both episodes.  No further shoulder pain and EKG has returned to the same of 12/11/13.    His lt leg though continues to be painful with walking or standing.  He states it never improved. Feels the same now as prior to PCI. Pain along tibia with standing or walking.   His ABIs have changed though to reflect the opening of the vessel.  Now 1.2 bil ABI's -pre procedure ABI on Lt  0.76.  I also  Palpate his pulse now.  Foot is somewhat swollen but it was this was post procedure as  well, venous dopplers were negative for DVT.    Post PCI he was seen by Renal for increased CR.  He will see Dr. Marval Regal next week.  He will have lab for them this week.   Allergies  Allergen Reactions  . Kiwi Extract Swelling    Facial swelling     Current Outpatient Prescriptions  Medication Sig Dispense Refill  . amLODipine (NORVASC) 5 MG tablet Take 5 mg by mouth daily.      Marland Kitchen aspirin EC 81 MG tablet Take 1 tablet (81 mg total) by mouth daily.      Marland Kitchen atorvastatin (LIPITOR) 40 MG tablet Take 1 tablet (40 mg total) by mouth daily at 6 PM.  30 tablet  5  . carvedilol (COREG) 25 MG tablet Take 25 mg by mouth 2 (two) times daily with a meal.      . Cholecalciferol (VITAMIN D PO) Take 1 tablet by mouth daily.       . cloNIDine (CATAPRES) 0.3 MG tablet Take 1 tablet (0.3 mg total) by mouth 2 (two) times daily.  30 tablet  11  . clopidogrel (PLAVIX) 75 MG tablet Take 1 tablet (75 mg total) by mouth daily with breakfast.  30 tablet  11  . cyclobenzaprine (FLEXERIL) 10 MG tablet Take 10 mg by mouth at  bedtime.       Marland Kitchen HYDROcodone-acetaminophen (NORCO) 5-325 MG per tablet Take 1 tablet by mouth every 6 (six) hours as needed for moderate pain.  15 tablet  0  . insulin aspart (NOVOLOG) 100 UNIT/ML injection Inject into the skin continuous. Per insulin pump      . nitroGLYCERIN (NITROSTAT) 0.4 MG SL tablet Place 1 tablet (0.4 mg total) under the tongue every 5 (five) minutes as needed for chest pain.  25 tablet  3   No current facility-administered medications for this visit.    Past Medical History  Diagnosis Date  . Diabetes mellitus   . Hyperlipidemia   . HTN (hypertension)     echo 3/10: EF 60%, LAE  . CAD (coronary artery disease)     a. cath 3/10: oLAD 25%, mLAD 25%; CFX 25%; oDx 80% (small - tx. medically); pRCA 25%, mRCA 25%; OM3 20%;  b.  Myoview 4/11: EF 53%, no scar or ischemia   c. MV 2012 Nl perfusion, apical thinning.  No ischemia or scar.  EF 49%, appears greater by visual  estimate.;  d.  Dob stress echo 12/13:  Negative Dob stress echo. There is no evidence of ischemia.  The LVF is normal.  . Chronic chest pain   . GERD (gastroesophageal reflux disease)   . Nephrolithiasis   . CKD (chronic kidney disease), stage IV     a. 05/2012 Renal u/s: medical renal dzs;  b. seen by [facility]  . Carotid stenosis     a. dopplers 05/2012:  [id number] RICA; [id number] LICA => repeat in 6 mos  . Snores     a. presumed OSA, pt has refused sleep eval in past.  . Peripheral arterial disease     high-grade distal left SFA by duplex ultrasound    Past Surgical History  Procedure Laterality Date  . Tonsillectomy    . Adenoidectomy    . Cardiac catheterization  2001 and 2010   . Angioplasty / stenting femoral Left 12/11/2013    dr berry    [id number] colds or fevers, some weight decrease Skin:no rashes or ulcers HEENT:no blurred vision, no congestion CV:see HPI PUL:see HPI GI:no diarrhea constipation or melena, no indigestion GU:no hematuria, no dysuria MS:no joint pain, no claudication Neuro:no syncope, no lightheadedness Endo:+ diabetes, no thyroid disease  Wt Readings from Last 3 Encounters:  01/13/14 182 lb 4.8 oz (82.691 kg)  12/16/13 188 lb 0.8 oz (85.3 kg)  12/13/13 188 lb 0.8 oz (85.3 kg)    PHYSICAL EXAM BP 169/95  Pulse 69  Ht 5\' 11"  (1.803 m)  Wt 182 lb 4.8 oz (82.691 kg)  BMI 25.44 kg/m2 General:Pleasant affect, NAD Skin:Warm and dry, brisk capillary refill HEENT:normocephalic, sclera clear, mucus membranes moist Neck:supple, no JVD, no bruits  Heart:S1S2 RRR without murmur, gallup, rub or click Lungs:clear without rales, rhonchi, or wheezes [id number], non tender, + BS, do not palpate liver spleen or masses Ext:tr to 1+ lower ext edema, 2+ pedal pulses, 2+ radial pulses Neuro:alert and oriented X 3, MAE, follows commands, + facial symmetry  [id number] EKG is now similar to EKG of 12/12/13, EKGs changes with last admit have resolved.    ASSESSMENT AND PLAN PVD (peripheral vascular disease) Recent PCI 12/11/13 for lifestyle limiting claudication successful chocolate balloon angioplasty followed by Lutonix drug eluting balloon angioplasty using CO2 and minimal contrast for lifestyle and claudication. To Lt SFA.  ABis have improved the symptoms remain.  Under very discussed with the patient recommended possible  back pain as the source of the pain he is currently having.   But .the left SFA is open.  CAD (coronary artery disease) nonobstructive per cath 2012 a.  cath 3/10: oLAD 25%, mLAD 25%/30%; CFX 25%; oDx 80% (small - tx. medically); pRCA 25%, mRCA 25%; OM3 20%;     b.  Myoview 4/11: EF 53%, no scar or ischemia C. Neg nuc 12/13/13  Recent abnormal EKG which has returned to normal.  No further chest pain.  Hypertension stable  Chronic kidney disease (CKD), stage IV (severe) To followup with renal next week will have labs this week per renal. Creatinine 5.2  Hyperlipidemia LDL goal <70 Lipid Panel     Component Value Date/Time   CHOL 200 12/12/2013 0023   TRIG 90 12/12/2013 0023   HDL 48 12/12/2013 0023   CHOLHDL 4.2 12/12/2013 0023   VLDL 18 12/12/2013 0023   LDLCALC 134* 12/12/2013 0023   followed by PCP    Pt will follow up with Dr. Adora Fridge in  6 months with lower ext art dopplers beforehand

## 2014-01-16 ENCOUNTER — Other Ambulatory Visit (HOSPITAL_COMMUNITY): Payer: Self-pay | Admitting: *Deleted

## 2014-01-19 ENCOUNTER — Encounter (HOSPITAL_COMMUNITY)
Admission: RE | Admit: 2014-01-19 | Discharge: 2014-01-19 | Disposition: A | Discharge status: Home / Self Care | Payer: BC Managed Care – PPO | Source: Ambulatory Visit | Attending: Nephrology | Admitting: Nephrology

## 2014-01-19 DIAGNOSIS — D638 Anemia in other chronic diseases classified elsewhere: Secondary | ICD-10-CM | POA: Diagnosis not present

## 2014-01-19 DIAGNOSIS — N184 Chronic kidney disease, stage 4 (severe): Secondary | ICD-10-CM | POA: Insufficient documentation

## 2014-01-19 LAB — CBC
HCT: 31.1 % — ABNORMAL LOW (ref 39.0–52.0)
Hemoglobin: 10 g/dL — ABNORMAL LOW (ref 13.0–17.0)
MCH: 27.5 pg (ref 26.0–34.0)
MCHC: 32.2 g/dL (ref 30.0–36.0)
MCV: 85.7 fL (ref 78.0–100.0)
Platelets: 191 10*3/uL (ref 150–400)
RBC: 3.63 MIL/uL — ABNORMAL LOW (ref 4.22–5.81)
RDW: 14 % (ref 11.5–15.5)
WBC: 4.8 10*3/uL (ref 4.0–10.5)

## 2014-01-19 MED ORDER — DARBEPOETIN ALFA-POLYSORBATE 60 MCG/0.3ML IJ SOLN
INTRAMUSCULAR | Status: AC
  Filled 2014-01-19: qty 0.3

## 2014-01-19 MED ORDER — DARBEPOETIN ALFA-POLYSORBATE 60 MCG/0.3ML IJ SOLN
60.0000 ug | INTRAMUSCULAR | Status: DC
  Administered 2014-01-19: 60 ug via SUBCUTANEOUS

## 2014-01-19 NOTE — Discharge Instructions (Signed)
Darbepoetin Alfa injection What is this medicine? DARBEPOETIN ALFA (dar be POE e tin AL fa) helps your body make more red blood cells. It is used to treat anemia caused by chronic kidney failure and chemotherapy. This medicine may be used for other purposes; ask your health care provider or pharmacist if you have questions. COMMON BRAND NAME(S): Aranesp What should I tell my health care provider before I take this medicine? They need to know if you have any of these conditions: -blood clotting disorders or history of blood clots -cancer patient not on chemotherapy -cystic fibrosis -heart disease, such as angina, heart failure, or a history of a heart attack -hemoglobin level of 12 g/dL or greater -high blood pressure -low levels of folate, iron, or vitamin B12 -seizures -an unusual or allergic reaction to darbepoetin, erythropoietin, albumin, hamster proteins, latex, other medicines, foods, dyes, or preservatives -pregnant or trying to get pregnant -breast-feeding How should I use this medicine? This medicine is for injection into a vein or under the skin. It is usually given by a health care professional in a hospital or clinic setting. If you get this medicine at home, you will be taught how to prepare and give this medicine. Do not shake the solution before you withdraw a dose. Use exactly as directed. Take your medicine at regular intervals. Do not take your medicine more often than directed. It is important that you put your used needles and syringes in a special sharps container. Do not put them in a trash can. If you do not have a sharps container, call your pharmacist or healthcare provider to get one. Talk to your pediatrician regarding the use of this medicine in children. While this medicine may be used in children as young as 1 year for selected conditions, precautions do apply. Overdosage: If you think you have taken too much of this medicine contact a poison control center or  emergency room at once. NOTE: This medicine is only for you. Do not share this medicine with others. What if I miss a dose? If you miss a dose, take it as soon as you can. If it is almost time for your next dose, take only that dose. Do not take double or extra doses. What may interact with this medicine? Do not take this medicine with any of the following medications: -epoetin alfa This list may not describe all possible interactions. Give your health care provider a list of all the medicines, herbs, non-prescription drugs, or dietary supplements you use. Also tell them if you smoke, drink alcohol, or use illegal drugs. Some items may interact with your medicine. What should I watch for while using this medicine? Visit your prescriber or health care professional for regular checks on your progress and for the needed blood tests and blood pressure measurements. It is especially important for the doctor to make sure your hemoglobin level is in the desired range, to limit the risk of potential side effects and to give you the best benefit. Keep all appointments for any recommended tests. Check your blood pressure as directed. Ask your doctor what your blood pressure should be and when you should contact him or her. As your body makes more red blood cells, you may need to take iron, folic acid, or vitamin B supplements. Ask your doctor or health care provider which products are right for you. If you have kidney disease continue dietary restrictions, even though this medication can make you feel better. Talk with your doctor or health   care professional about the foods you eat and the vitamins that you take. What side effects may I notice from receiving this medicine? Side effects that you should report to your doctor or health care professional as soon as possible: -allergic reactions like skin rash, itching or hives, swelling of the face, lips, or tongue -breathing problems -changes in vision -chest  pain -confusion, trouble speaking or understanding -feeling faint or lightheaded, falls -high blood pressure -muscle aches or pains -pain, swelling, warmth in the leg -rapid weight gain -severe headaches -sudden numbness or weakness of the face, arm or leg -trouble walking, dizziness, loss of balance or coordination -seizures (convulsions) -swelling of the ankles, feet, hands -unusually weak or tired Side effects that usually do not require medical attention (report to your doctor or health care professional if they continue or are bothersome): -diarrhea -fever, chills (flu-like symptoms) -headaches -nausea, vomiting -redness, stinging, or swelling at site where injected This list may not describe all possible side effects. Call your doctor for medical advice about side effects. You may report side effects to FDA at 1-800-FDA-1088. Where should I keep my medicine? Keep out of the reach of children. Store in a refrigerator between 2 and 8 degrees C (36 and 46 degrees F). Do not freeze. Do not shake. Throw away any unused portion if using a single-dose vial. Throw away any unused medicine after the expiration date. NOTE: This sheet is a summary. It may not cover all possible information. If you have questions about this medicine, talk to your doctor, pharmacist, or health care provider.  2015, Elsevier/Gold Standard. (2008-03-10 10:23:57)  

## 2014-01-27 ENCOUNTER — Encounter: Payer: Self-pay | Admitting: Surgery

## 2014-01-27 ENCOUNTER — Other Ambulatory Visit: Payer: Self-pay | Admitting: *Deleted

## 2014-01-27 DIAGNOSIS — N184 Chronic kidney disease, stage 4 (severe): Secondary | ICD-10-CM

## 2014-01-27 DIAGNOSIS — Z01810 Encounter for preprocedural cardiovascular examination: Secondary | ICD-10-CM

## 2014-01-30 ENCOUNTER — Encounter: Payer: Self-pay | Admitting: Surgery

## 2014-02-02 ENCOUNTER — Other Ambulatory Visit: Payer: Self-pay | Admitting: Surgery

## 2014-02-02 ENCOUNTER — Ambulatory Visit (INDEPENDENT_AMBULATORY_CARE_PROVIDER_SITE_OTHER): Payer: BC Managed Care – PPO | Admitting: Surgery

## 2014-02-02 ENCOUNTER — Ambulatory Visit (INDEPENDENT_AMBULATORY_CARE_PROVIDER_SITE_OTHER)
Admission: RE | Admit: 2014-02-02 | Discharge: 2014-02-02 | Disposition: A | Discharge status: Home / Self Care | Payer: BC Managed Care – PPO | Source: Ambulatory Visit | Attending: Surgery | Admitting: Surgery

## 2014-02-02 ENCOUNTER — Encounter: Payer: Self-pay | Admitting: Surgery

## 2014-02-02 ENCOUNTER — Ambulatory Visit (HOSPITAL_COMMUNITY)
Admission: RE | Admit: 2014-02-02 | Discharge: 2014-02-02 | Disposition: A | Discharge status: Home / Self Care | Payer: BC Managed Care – PPO | Source: Ambulatory Visit | Attending: Surgery | Admitting: Surgery

## 2014-02-02 VITALS — BP 185/87 | HR 68 | Temp 97.9°F | Resp 18 | Ht 71.0 in | Wt 187.4 lb

## 2014-02-02 DIAGNOSIS — Z01810 Encounter for preprocedural cardiovascular examination: Secondary | ICD-10-CM

## 2014-02-02 DIAGNOSIS — N184 Chronic kidney disease, stage 4 (severe): Secondary | ICD-10-CM | POA: Diagnosis not present

## 2014-02-02 NOTE — Progress Notes (Signed)
Referred by:  [name], MD [id number] [place], [place] [address]  Reason for referral: New access  History of Present Illness  Jon Anderson is a 63 y.o. (06-20-1950) male who presents for evaluation for permanent access.  The patient is right hand dominant.  The patient has not had previous access procedures. He is followed by Dr. Marval Regal for his stage 4 CKD. He has a past medical history of hypertension, insulin dependent diabetes, CAD, carotid stenosis and PVD. He recently had balloon angioplasty of his left SFA by Dr. Gwenlyn Found on 12/11/13.   He has hypertension managed on a CCB and BB. He has diabetes managed with an insulin pump. He has hypercholesterolemia managed on a statin. He takes a daily aspirin and is on plavix.     Past Medical History  Diagnosis Date  . Diabetes mellitus   . Hyperlipidemia   . HTN (hypertension)     echo 3/10: EF 60%, LAE  . CAD (coronary artery disease)     a. cath 3/10: oLAD 25%, mLAD 25%; CFX 25%; oDx 80% (small - tx. medically); pRCA 25%, mRCA 25%; OM3 20%;  b.  Myoview 4/11: EF 53%, no scar or ischemia   c. MV 2012 Nl perfusion, apical thinning.  No ischemia or scar.  EF 49%, appears greater by visual estimate.;  d.  Dob stress echo 12/13:  Negative Dob stress echo. There is no evidence of ischemia.  The LVF is normal.  . Chronic chest pain   . GERD (gastroesophageal reflux disease)   . Nephrolithiasis   . CKD (chronic kidney disease), stage IV     a. 05/2012 Renal u/s: medical renal dzs;  b. seen by [facility]  . Carotid stenosis     a. dopplers 05/2012:  [id number] RICA; [id number] LICA => repeat in 6 mos  . Snores     a. presumed OSA, pt has refused sleep eval in past.  . Peripheral arterial disease     high-grade distal left SFA by duplex ultrasound    Past Surgical History  Procedure Laterality Date  . Tonsillectomy    . Adenoidectomy    . Cardiac catheterization  2001 and 2010   . Angioplasty / stenting femoral Left 12/11/2013      dr berry    History   Social History  . Marital Status: Married    Spouse Name: N/A    Number of Children: N/A  . Years of Education: N/A   Occupational History  . Not on file.   Social History Main Topics  . Smoking status: Former Research scientist (life sciences)  . Smokeless tobacco: Never Used  . Alcohol Use: No  . Drug Use: No  . Sexual Activity: Yes    Birth Control/ Protection: None   Other Topics Concern  . Not on file   Social History Narrative   The patient is a Retail buyer.  He is married and has 2 grown children.  Lives in [place] with his wife.  Denies tobacco, alcohol or IV drug abuse or marijuana or cocaine intake.     Family History  Problem Relation Age of Onset  . Heart attack Sister     died @ 61  . Cancer Mother     died @ 5  . Diabetes Brother     deceased    Current Outpatient Prescriptions on File Prior to Visit  Medication Sig Dispense Refill  . amLODipine (NORVASC) 5 MG tablet Take 5 mg by mouth daily.      Marland Kitchen  aspirin EC 81 MG tablet Take 1 tablet (81 mg total) by mouth daily.      Marland Kitchen atorvastatin (LIPITOR) 40 MG tablet Take 1 tablet (40 mg total) by mouth daily at 6 PM.  30 tablet  5  . carvedilol (COREG) 25 MG tablet Take 25 mg by mouth 2 (two) times daily with a meal.      . Cholecalciferol (VITAMIN D PO) Take 1 tablet by mouth daily.       . cloNIDine (CATAPRES) 0.3 MG tablet Take 1 tablet (0.3 mg total) by mouth 2 (two) times daily.  30 tablet  11  . clopidogrel (PLAVIX) 75 MG tablet Take 1 tablet (75 mg total) by mouth daily with breakfast.  30 tablet  11  . cyclobenzaprine (FLEXERIL) 10 MG tablet Take 10 mg by mouth at bedtime.       Marland Kitchen HYDROcodone-acetaminophen (NORCO) 5-325 MG per tablet Take 1 tablet by mouth every 6 (six) hours as needed for moderate pain.  15 tablet  0  . insulin aspart (NOVOLOG) 100 UNIT/ML injection Inject into the skin continuous. Per insulin pump      . nitroGLYCERIN (NITROSTAT) 0.4 MG SL tablet Place 1 tablet (0.4 mg total) under the tongue  every 5 (five) minutes as needed for chest pain.  25 tablet  3   No current facility-administered medications on file prior to visit.    Allergies  Allergen Reactions  . Kiwi Extract Swelling    Facial swelling     REVIEW OF SYSTEMS:  (Positives checked otherwise negative)  CARDIOVASCULAR:  []  chest pain, []  chest pressure, []  palpitations, []  shortness of breath when laying flat, []  shortness of breath with exertion,  [x]  pain in legs when walking, []  pain in feet when laying flat, []  history of blood clot in veins (DVT), []  history of phlebitis, [x]  swelling in legs, []  varicose veins  PULMONARY:  []  productive cough, []  asthma, []  wheezing  NEUROLOGIC:  []  weakness in arms or legs, []  numbness in arms or legs, []  difficulty speaking or slurred speech, []  temporary loss of vision in one eye, []  dizziness  HEMATOLOGIC:  []  bleeding problems, []  problems with blood clotting too easily  MUSCULOSKEL:  []  joint pain, []  joint swelling  GASTROINTEST:  []  vomiting blood, []  blood in stool     GENITOURINARY:  []  burning with urination, []  blood in urine  PSYCHIATRIC:  []  history of major depression  INTEGUMENTARY:  []  rashes, []  ulcers  CONSTITUTIONAL:  []  fever, []  chills  Physical Examination  Filed Vitals:   02/02/14 1559  BP: 185/87  Pulse: 68  Temp: 97.9 F (36.6 C)  TempSrc: Oral  Resp: 18  Height: 5\' 11"  (1.803 m)  Weight: 187 lb 6.4 oz (85.004 kg)  SpO2: 99%   Body mass index is 26.15 kg/(m^2).  General: A&O x 3, WDWN male in NAD  Head: [place]/AT  Neck: Supple, left carotid bruit, no right carotid bruit  Pulmonary: Sym exp, good air movt, CTAB, no rales, rhonchi, & wheezing   Cardiac: RRR, Nl S1, S2, no Murmurs, rubs or gallops  Vascular: 2+ radial, brachial and dorsalis pedis pulses bilaterally.   Gastrointestinal: soft, NTND, -G/R, - HSM, - masses  Musculoskeletal: M/S 5/5 throughout. Extremities without ischemic changes.  Neurologic: CN 2-12 intact.   Pain and light touch intact in extremities.  Motor exam as listed above  Psychiatric: Judgment intact, Mood & affect appropriate for pt's clinical situation.  Dermatologic: See M/S exam for  extremity exam, no rashes otherwise noted  Non-Invasive Vascular Imaging  Vein Mapping  (Date: 02/02/2014):   L arm: acceptable vein conduits include upper arm cephalic  Medical Decision Making  Jon Anderson is a 63 y.o. male who presents with CKD stage IV not yet requiring hemodialysis.   Based on vein mapping and examination, this patient's permanent access options include: left brachial cephalic AV fistula.   I had an extensive discussion with this patient in regards to the nature of access surgery, including risk, benefits, and alternatives.    The patient is aware that the risks of access surgery include but are not limited to: bleeding, infection, steal syndrome, nerve damage, failure of access to mature, and possible need for additional access procedures in the future.  The patient would like additional time to think before proceeding with surgery. He will call to schedule when he is ready. He will need to be off of his Plavix 5 days prior to surgery.   Virgina Jock, PA-C [facility] Office: [phone] Pager: [phone]  02/02/2014, 4:49 PM  This patient was seen in conjunction with Dr. Trula Slade    The pateint is a candidate for left BCF.  I discussed the details of the procedure and risks and beneftis including the risk of non-maturity and steal.  He wants to contact me with a date in December.  Annamarie Major

## 2014-02-16 ENCOUNTER — Ambulatory Visit (HOSPITAL_COMMUNITY)
Admission: RE | Admit: 2014-02-16 | Discharge: 2014-02-16 | Disposition: A | Discharge status: Home / Self Care | Payer: BC Managed Care – PPO | Source: Ambulatory Visit | Attending: Nephrology | Admitting: Nephrology

## 2014-02-16 DIAGNOSIS — N184 Chronic kidney disease, stage 4 (severe): Secondary | ICD-10-CM | POA: Diagnosis present

## 2014-02-16 LAB — COMPREHENSIVE METABOLIC PANEL
ALT: 10 U/L (ref 0–53)
AST: 11 U/L (ref 0–37)
Albumin: 3.1 g/dL — ABNORMAL LOW (ref 3.5–5.2)
Alkaline Phosphatase: 108 U/L (ref 39–117)
Anion gap: 16 — ABNORMAL HIGH (ref 5–15)
BUN: 48 mg/dL — ABNORMAL HIGH (ref 6–23)
CO2: 20 mEq/L (ref 19–32)
Calcium: 7.9 mg/dL — ABNORMAL LOW (ref 8.4–10.5)
Chloride: 104 mEq/L (ref 96–112)
Creatinine, Ser: 6.08 mg/dL — ABNORMAL HIGH (ref 0.50–1.35)
GFR calc Af Amer: 10 mL/min — ABNORMAL LOW (ref >90)
GFR calc non Af Amer: 9 mL/min — ABNORMAL LOW (ref >90)
Glucose, Bld: 153 mg/dL — ABNORMAL HIGH (ref 70–99)
Potassium: 3.8 mEq/L (ref 3.7–5.3)
Sodium: 140 mEq/L (ref 137–147)
Total Bilirubin: 0.3 mg/dL (ref 0.3–1.2)
Total Protein: 6.7 g/dL (ref 6.0–8.3)

## 2014-02-16 LAB — CBC
HCT: 30.7 % — ABNORMAL LOW (ref 39.0–52.0)
Hemoglobin: 10.1 g/dL — ABNORMAL LOW (ref 13.0–17.0)
MCH: 28.8 pg (ref 26.0–34.0)
MCHC: 32.9 g/dL (ref 30.0–36.0)
MCV: 87.5 fL (ref 78.0–100.0)
Platelets: 202 10*3/uL (ref 150–400)
RBC: 3.51 MIL/uL — ABNORMAL LOW (ref 4.22–5.81)
RDW: 14.4 % (ref 11.5–15.5)
WBC: 4.7 10*3/uL (ref 4.0–10.5)

## 2014-02-16 LAB — IRON AND TIBC
Iron: 71 ug/dL (ref 42–135)
Saturation Ratios: 25 % (ref 20–55)
TIBC: 280 ug/dL (ref 215–435)
UIBC: 209 ug/dL (ref 125–400)

## 2014-02-16 LAB — FERRITIN: Ferritin: 35 ng/mL (ref 22–322)

## 2014-02-16 LAB — HEPATITIS B SURFACE ANTIGEN: Hepatitis B Surface Ag: NEGATIVE

## 2014-02-16 MED ORDER — DARBEPOETIN ALFA 60 MCG/0.3ML IJ SOSY
60.0000 ug | PREFILLED_SYRINGE | INTRAMUSCULAR | Status: DC
  Administered 2014-02-16: 60 ug via SUBCUTANEOUS

## 2014-02-16 MED ORDER — DARBEPOETIN ALFA 60 MCG/0.3ML IJ SOSY
PREFILLED_SYRINGE | INTRAMUSCULAR | Status: AC
  Filled 2014-02-16: qty 0.3

## 2014-02-24 ENCOUNTER — Other Ambulatory Visit: Payer: Self-pay

## 2014-03-11 ENCOUNTER — Encounter: Payer: Self-pay | Admitting: *Deleted

## 2014-03-16 ENCOUNTER — Ambulatory Visit (HOSPITAL_COMMUNITY)
Admission: RE | Admit: 2014-03-16 | Discharge: 2014-03-16 | Disposition: A | Discharge status: Home / Self Care | Payer: BC Managed Care – PPO | Source: Ambulatory Visit | Attending: Nephrology | Admitting: Nephrology

## 2014-03-16 DIAGNOSIS — N184 Chronic kidney disease, stage 4 (severe): Secondary | ICD-10-CM | POA: Diagnosis not present

## 2014-03-16 LAB — COMPREHENSIVE METABOLIC PANEL
ALT: 8 U/L (ref 0–53)
AST: 8 U/L (ref 0–37)
Albumin: 3.1 g/dL — ABNORMAL LOW (ref 3.5–5.2)
Alkaline Phosphatase: 117 U/L (ref 39–117)
Anion gap: 16 — ABNORMAL HIGH (ref 5–15)
BUN: 61 mg/dL — ABNORMAL HIGH (ref 6–23)
CO2: 20 mEq/L (ref 19–32)
Calcium: 8.3 mg/dL — ABNORMAL LOW (ref 8.4–10.5)
Chloride: 104 mEq/L (ref 96–112)
Creatinine, Ser: 7.49 mg/dL — ABNORMAL HIGH (ref 0.50–1.35)
GFR calc Af Amer: 8 mL/min — ABNORMAL LOW (ref >90)
GFR calc non Af Amer: 7 mL/min — ABNORMAL LOW (ref >90)
Glucose, Bld: 134 mg/dL — ABNORMAL HIGH (ref 70–99)
Potassium: 4.1 mEq/L (ref 3.7–5.3)
Sodium: 140 mEq/L (ref 137–147)
Total Bilirubin: 0.3 mg/dL (ref 0.3–1.2)
Total Protein: 6.8 g/dL (ref 6.0–8.3)

## 2014-03-16 LAB — CBC
HCT: 32.3 % — ABNORMAL LOW (ref 39.0–52.0)
Hemoglobin: 10.2 g/dL — ABNORMAL LOW (ref 13.0–17.0)
MCH: 27.1 pg (ref 26.0–34.0)
MCHC: 31.6 g/dL (ref 30.0–36.0)
MCV: 85.7 fL (ref 78.0–100.0)
Platelets: 212 10*3/uL (ref 150–400)
RBC: 3.77 MIL/uL — ABNORMAL LOW (ref 4.22–5.81)
RDW: 14.1 % (ref 11.5–15.5)
WBC: 4.7 10*3/uL (ref 4.0–10.5)

## 2014-03-16 LAB — HEPATITIS B SURFACE ANTIGEN: Hepatitis B Surface Ag: NEGATIVE

## 2014-03-16 LAB — IRON AND TIBC
Iron: 59 ug/dL (ref 42–135)
Saturation Ratios: 21 % (ref 20–55)
TIBC: 285 ug/dL (ref 215–435)
UIBC: 226 ug/dL (ref 125–400)

## 2014-03-16 LAB — FERRITIN: Ferritin: 33 ng/mL (ref 22–322)

## 2014-03-16 MED ORDER — DARBEPOETIN ALFA 60 MCG/0.3ML IJ SOSY
60.0000 ug | PREFILLED_SYRINGE | INTRAMUSCULAR | Status: DC
  Administered 2014-03-16: 60 ug via SUBCUTANEOUS

## 2014-03-16 MED ORDER — DARBEPOETIN ALFA 60 MCG/0.3ML IJ SOSY
PREFILLED_SYRINGE | INTRAMUSCULAR | Status: AC
  Filled 2014-03-16: qty 0.3

## 2014-03-18 ENCOUNTER — Encounter (HOSPITAL_COMMUNITY): Payer: Self-pay | Admitting: *Deleted

## 2014-03-18 MED ORDER — DEXTROSE 5 % IV SOLN
1.5000 g | INTRAVENOUS | Status: AC
  Administered 2014-03-19: 1.5 g via INTRAVENOUS
  Filled 2014-03-18: qty 1.5

## 2014-03-18 MED ORDER — CHLORHEXIDINE GLUCONATE CLOTH 2 % EX PADS: 6.0000 | MEDICATED_PAD | Freq: Once | CUTANEOUS | Status: DC

## 2014-03-18 MED ORDER — SODIUM CHLORIDE 0.9 % IV SOLN: INTRAVENOUS | Status: DC

## 2014-03-18 NOTE — Progress Notes (Signed)
Pt denies SOB and chest pain but is seen at [facility] for cardiac care ( Dr. Gwenlyn Found ?). Pt stated that his last dose of Plavix was on 03/13/14 as instructed by MD. Damaris Schooner with Suanne Marker, Diabetic Coordinator regarding pt insulin pump instructions. Suanne Marker advised that pt not stop pump and check blood glucose; pt made aware. Pt stated that his Norvasc is currently being substituted with Bidil until his Norvasc prescription can be filled.

## 2014-03-19 ENCOUNTER — Ambulatory Visit (HOSPITAL_COMMUNITY): Payer: BC Managed Care – PPO | Admitting: Anesthesiology

## 2014-03-19 ENCOUNTER — Encounter (HOSPITAL_COMMUNITY)
Admission: RE | Disposition: A | Discharge status: Home / Self Care | Payer: Self-pay | Source: Ambulatory Visit | Attending: Surgery

## 2014-03-19 ENCOUNTER — Telehealth: Payer: Self-pay | Admitting: Vascular Surgery

## 2014-03-19 ENCOUNTER — Other Ambulatory Visit: Payer: Self-pay | Admitting: *Deleted

## 2014-03-19 ENCOUNTER — Ambulatory Visit (HOSPITAL_COMMUNITY)
Admission: RE | Admit: 2014-03-19 | Discharge: 2014-03-19 | Disposition: A | Discharge status: Home / Self Care | Payer: BC Managed Care – PPO | Source: Ambulatory Visit | Attending: Surgery | Admitting: Surgery

## 2014-03-19 ENCOUNTER — Encounter (HOSPITAL_COMMUNITY): Payer: Self-pay | Admitting: *Deleted

## 2014-03-19 DIAGNOSIS — K219 Gastro-esophageal reflux disease without esophagitis: Secondary | ICD-10-CM | POA: Diagnosis not present

## 2014-03-19 DIAGNOSIS — G4733 Obstructive sleep apnea (adult) (pediatric): Secondary | ICD-10-CM | POA: Insufficient documentation

## 2014-03-19 DIAGNOSIS — I2510 Atherosclerotic heart disease of native coronary artery without angina pectoris: Secondary | ICD-10-CM | POA: Insufficient documentation

## 2014-03-19 DIAGNOSIS — Z87891 Personal history of nicotine dependence: Secondary | ICD-10-CM | POA: Diagnosis not present

## 2014-03-19 DIAGNOSIS — I129 Hypertensive chronic kidney disease with stage 1 through stage 4 chronic kidney disease, or unspecified chronic kidney disease: Secondary | ICD-10-CM | POA: Diagnosis not present

## 2014-03-19 DIAGNOSIS — Z7982 Long term (current) use of aspirin: Secondary | ICD-10-CM | POA: Diagnosis not present

## 2014-03-19 DIAGNOSIS — N184 Chronic kidney disease, stage 4 (severe): Secondary | ICD-10-CM | POA: Diagnosis present

## 2014-03-19 DIAGNOSIS — E119 Type 2 diabetes mellitus without complications: Secondary | ICD-10-CM | POA: Diagnosis not present

## 2014-03-19 DIAGNOSIS — E785 Hyperlipidemia, unspecified: Secondary | ICD-10-CM | POA: Insufficient documentation

## 2014-03-19 DIAGNOSIS — Z992 Dependence on renal dialysis: Secondary | ICD-10-CM | POA: Insufficient documentation

## 2014-03-19 DIAGNOSIS — I739 Peripheral vascular disease, unspecified: Secondary | ICD-10-CM | POA: Diagnosis not present

## 2014-03-19 DIAGNOSIS — N186 End stage renal disease: Secondary | ICD-10-CM

## 2014-03-19 DIAGNOSIS — Z4931 Encounter for adequacy testing for hemodialysis: Secondary | ICD-10-CM

## 2014-03-19 HISTORY — PX: AV FISTULA PLACEMENT: SHX1204

## 2014-03-19 LAB — POCT I-STAT 4, (NA,K, GLUC, HGB,HCT)
Glucose, Bld: 131 mg/dL — ABNORMAL HIGH (ref 70–99)
HCT: 27 % — ABNORMAL LOW (ref 39.0–52.0)
Hemoglobin: 9.2 g/dL — ABNORMAL LOW (ref 13.0–17.0)
Potassium: 3.7 mEq/L (ref 3.7–5.3)
Sodium: 139 mEq/L (ref 137–147)

## 2014-03-19 LAB — GLUCOSE, CAPILLARY
Glucose-Capillary: 63 mg/dL — ABNORMAL LOW (ref 70–99)
Glucose-Capillary: 104 mg/dL — ABNORMAL HIGH (ref 70–99)
Glucose-Capillary: 102 mg/dL — ABNORMAL HIGH (ref 70–99)

## 2014-03-19 SURGERY — ARTERIOVENOUS (AV) FISTULA CREATION
Anesthesia: Monitor Anesthesia Care | Site: Arm Upper | Laterality: Left

## 2014-03-19 MED ORDER — MIDAZOLAM HCL 2 MG/2ML IJ SOLN
INTRAMUSCULAR | Status: AC
Start: 2014-03-19 — End: 2014-03-19
  Filled 2014-03-19: qty 2

## 2014-03-19 MED ORDER — OXYCODONE HCL 5 MG/5ML PO SOLN: 5.0000 mg | Freq: Once | ORAL | Status: DC | PRN

## 2014-03-19 MED ORDER — HYDROMORPHONE HCL 1 MG/ML IJ SOLN
0.2500 mg | INTRAMUSCULAR | Status: DC | PRN
  Administered 2014-03-19: 0.5 mg via INTRAVENOUS

## 2014-03-19 MED ORDER — FENTANYL CITRATE 0.05 MG/ML IJ SOLN
INTRAMUSCULAR | Status: DC | PRN
  Administered 2014-03-19: 50 ug via INTRAVENOUS
  Administered 2014-03-19: 25 ug via INTRAVENOUS

## 2014-03-19 MED ORDER — FENTANYL CITRATE 0.05 MG/ML IJ SOLN
INTRAMUSCULAR | Status: AC
Start: 2014-03-19 — End: 2014-03-19
  Filled 2014-03-19: qty 5

## 2014-03-19 MED ORDER — PROMETHAZINE HCL 25 MG/ML IJ SOLN: 6.2500 mg | INTRAMUSCULAR | Status: DC | PRN

## 2014-03-19 MED ORDER — DEXTROSE 50 % IV SOLN
12.5000 g | Freq: Once | INTRAVENOUS | Status: AC
  Administered 2014-03-19: 12.5 g via INTRAVENOUS
  Filled 2014-03-19: qty 50
  Filled 2014-03-19: qty 25

## 2014-03-19 MED ORDER — HYDROCODONE-ACETAMINOPHEN 5-325 MG PO TABS
1.0000 | ORAL_TABLET | Freq: Four times a day (QID) | ORAL | Status: DC | PRN
Start: 2014-03-19 — End: 2015-10-09

## 2014-03-19 MED ORDER — LIDOCAINE-EPINEPHRINE (PF) 1 %-1:200000 IJ SOLN
INTRAMUSCULAR | Status: DC | PRN
  Administered 2014-03-19: 10 mL

## 2014-03-19 MED ORDER — PROPOFOL 10 MG/ML IV BOLUS
INTRAVENOUS | Status: AC
  Filled 2014-03-19: qty 20

## 2014-03-19 MED ORDER — LIDOCAINE-EPINEPHRINE (PF) 1 %-1:200000 IJ SOLN
INTRAMUSCULAR | Status: AC
Start: 2014-03-19 — End: 2014-03-19
  Filled 2014-03-19: qty 10

## 2014-03-19 MED ORDER — MIDAZOLAM HCL 5 MG/5ML IJ SOLN
INTRAMUSCULAR | Status: DC | PRN
  Administered 2014-03-19: 1 mg via INTRAVENOUS

## 2014-03-19 MED ORDER — SODIUM CHLORIDE 0.9 % IR SOLN
Status: DC | PRN
  Administered 2014-03-19: 500 mL

## 2014-03-19 MED ORDER — DEXTROSE 50 % IV SOLN: 25.0000 mL | Freq: Once | INTRAVENOUS | Status: DC

## 2014-03-19 MED ORDER — HYDROMORPHONE HCL 1 MG/ML IJ SOLN
INTRAMUSCULAR | Status: AC
  Filled 2014-03-19: qty 1

## 2014-03-19 MED ORDER — 0.9 % SODIUM CHLORIDE (POUR BTL) OPTIME
TOPICAL | Status: DC | PRN
  Administered 2014-03-19: 1000 mL

## 2014-03-19 MED ORDER — OXYCODONE HCL 5 MG PO TABS: 5.0000 mg | ORAL_TABLET | Freq: Once | ORAL | Status: DC | PRN

## 2014-03-19 MED ORDER — SODIUM CHLORIDE 0.9 % IV SOLN
INTRAVENOUS | Status: DC | PRN
  Administered 2014-03-19: 07:00:00 via INTRAVENOUS

## 2014-03-19 SURGICAL SUPPLY — 27 items
ARMBAND PINK RESTRICT EXTREMIT (MISCELLANEOUS) ×2 IMPLANT
BLADE SURG 10 STRL SS (BLADE) ×2 IMPLANT
CANISTER SUCTION 2500CC (MISCELLANEOUS) ×2 IMPLANT
CLIP TI MEDIUM 6 (CLIP) ×2 IMPLANT
CLIP TI WIDE RED SMALL 6 (CLIP) ×2 IMPLANT
COVER PROBE W GEL 5X96 (DRAPES) IMPLANT
COVER SURGICAL LIGHT HANDLE (MISCELLANEOUS) ×2 IMPLANT
DRAIN PENROSE 1/4X12 LTX STRL (WOUND CARE) ×2 IMPLANT
ELECT REM PT RETURN 9FT ADLT (ELECTROSURGICAL) ×2
ELECTRODE REM PT RTRN 9FT ADLT (ELECTROSURGICAL) ×1 IMPLANT
GEL ULTRASOUND 20GR AQUASONIC (MISCELLANEOUS) IMPLANT
GLOVE SS BIOGEL STRL SZ 7 (GLOVE) ×1 IMPLANT
GLOVE SUPERSENSE BIOGEL SZ 7 (GLOVE) ×1
GOWN STRL REUS W/ TWL LRG LVL3 (GOWN DISPOSABLE) ×3 IMPLANT
GOWN STRL REUS W/TWL LRG LVL3 (GOWN DISPOSABLE) ×6
KIT BASIN OR (CUSTOM PROCEDURE TRAY) ×2 IMPLANT
KIT ROOM TURNOVER OR (KITS) ×2 IMPLANT
LIQUID BAND (GAUZE/BANDAGES/DRESSINGS) ×2 IMPLANT
NS IRRIG 1000ML POUR BTL (IV SOLUTION) ×2 IMPLANT
PACK CV ACCESS (CUSTOM PROCEDURE TRAY) ×2 IMPLANT
PAD ARMBOARD 7.5X6 YLW CONV (MISCELLANEOUS) ×4 IMPLANT
PROBE PENCIL 8 MHZ STRL DISP (MISCELLANEOUS) IMPLANT
SUT PROLENE 6 0 BV (SUTURE) ×6 IMPLANT
SUT VIC AB 3-0 SH 27 (SUTURE) ×2
SUT VIC AB 3-0 SH 27X BRD (SUTURE) ×1 IMPLANT
UNDERPAD 30X30 INCONTINENT (UNDERPADS AND DIAPERS) ×2 IMPLANT
WATER STERILE IRR 1000ML POUR (IV SOLUTION) ×2 IMPLANT

## 2014-03-19 NOTE — Telephone Encounter (Addendum)
-----   Message from Mena Goes, RN sent at 03/19/2014  9:18 AM EST ----- Regarding: Schedule   ----- Message -----    From: Gabriel Earing, PA-C    Sent: 03/19/2014   8:32 AM      To: [facility] Charge Pool  S/p left BC AVF 03/19/14.  F/u with Dr. Kellie Simmering in 6 weeks with duplex.  Thanks,  Samantha  03/19/14: lm for pt re appts, dpm

## 2014-03-19 NOTE — H&P (Signed)
Vascular Surgery Consultation  Reason for Consult: Needs vascular access  HPI: Jon Anderson is a 63 y.o. male who presents for evaluation of need for vascular access. This patient is not yet on hemodialysis but has stage IV chronic kidney disease. He is right-handed. Negative   Past Medical History  Diagnosis Date  . Diabetes mellitus   . Hyperlipidemia   . HTN (hypertension)     echo 3/10: EF 60%, LAE  . CAD (coronary artery disease)     a. cath 3/10: oLAD 25%, mLAD 25%; CFX 25%; oDx 80% (small - tx. medically); pRCA 25%, mRCA 25%; OM3 20%;  b.  Myoview 4/11: EF 53%, no scar or ischemia   c. MV 2012 Nl perfusion, apical thinning.  No ischemia or scar.  EF 49%, appears greater by visual estimate.;  d.  Dob stress echo 12/13:  Negative Dob stress echo. There is no evidence of ischemia.  The LVF is normal.  . Chronic chest pain   . GERD (gastroesophageal reflux disease)   . Nephrolithiasis   . CKD (chronic kidney disease), stage IV     a. 05/2012 Renal u/s: medical renal dzs;  b. seen by [facility]  . Carotid stenosis     a. dopplers 05/2012:  [id number] RICA; [id number] LICA => repeat in 6 mos  . Snores     a. presumed OSA, pt has refused sleep eval in past.  . Peripheral arterial disease     high-grade distal left SFA by duplex ultrasound   Past Surgical History  Procedure Laterality Date  . Tonsillectomy    . Adenoidectomy    . Cardiac catheterization  2001 and 2010   . Angioplasty / stenting femoral Left 12/11/2013    dr berry  . Hernia repair    . Fracture surgery      right foot  . Colonoscopy w/ biopsies and polypectomy     History   Social History  . Marital Status: Married    Spouse Name: N/A    Number of Children: N/A  . Years of Education: N/A   Social History Main Topics  . Smoking status: Former Research scientist (life sciences)  . Smokeless tobacco: Never Used  . Alcohol Use: No  . Drug Use: No  . Sexual Activity: Yes    Birth Control/ Protection: None   Other Topics Concern   . 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  Lives in [place] with his wife.  Denies tobacco, alcohol or IV drug abuse or marijuana or cocaine intake.    Family History  Problem Relation Age of Onset  . Heart attack Sister     died @ 38  . Cancer Mother     died @ 32  . Diabetes Brother     deceased   Allergies  Allergen Reactions  . Kiwi Extract Swelling    Facial swelling    Prior to Admission medications   Medication Sig Start Date End Date Taking? Authorizing Provider  amLODipine (NORVASC) 10 MG tablet Take 10 mg by mouth daily. 01/15/14  Yes Historical Provider, MD  aspirin EC 81 MG tablet Take 1 tablet (81 mg total) by mouth daily. 05/28/12  Yes Liliane Shi, PA-C  atorvastatin (LIPITOR) 40 MG tablet Take 1 tablet (40 mg total) by mouth daily at 6 PM. 12/13/13  Yes Brett Canales, PA-C  carvedilol (COREG) 25 MG tablet Take 25 mg by  mouth 2 (two) times daily with a meal.   Yes Historical Provider, MD  Cholecalciferol (VITAMIN D PO) Take 1 tablet by mouth daily.    Yes Historical Provider, MD  cloNIDine (CATAPRES) 0.3 MG tablet Take 1 tablet (0.3 mg total) by mouth 2 (two) times daily. 03/20/12  Yes Rhonda G Barrett, PA-C  clopidogrel (PLAVIX) 75 MG tablet Take 1 tablet (75 mg total) by mouth daily with breakfast. 12/13/13  Yes Einar Pheasant Hager, PA-C  cyclobenzaprine (FLEXERIL) 10 MG tablet Take 10 mg by mouth at bedtime.    Yes Historical Provider, MD  furosemide (LASIX) 40 MG tablet Take 40 mg by mouth daily. 02/19/14  Yes Historical Provider, MD  HYDROcodone-acetaminophen (NORCO) 5-325 MG per tablet Take 1 tablet by mouth every 6 (six) hours as needed for moderate pain. 12/16/13  Yes Charlynne Cousins, MD  insulin aspart (NOVOLOG) 100 UNIT/ML injection Inject into the skin continuous. Per insulin pump 03/20/12  Yes Rhonda G Barrett, PA-C  isosorbide-hydrALAZINE (BIDIL) 20-37.5 MG per tablet Take 1 tablet by mouth 2 (two) times  daily.   Yes Historical Provider, MD  nitroGLYCERIN (NITROSTAT) 0.4 MG SL tablet Place 1 tablet (0.4 mg total) under the tongue every 5 (five) minutes as needed for chest pain. 03/20/12   Rhonda G Barrett, PA-C     Positive ROS:  All other systems have been reviewed and were otherwise negative with the exception of those mentioned in the HPI and as above.  Physical Exam: Filed Vitals:   03/19/14 0554  BP: 151/70  Pulse: 69  Temp: 98.3 F (36.8 C)  Resp: 18    General: Alert, no acute distress HEENT: Normal for age Cardiovascular: Regular rate and rhythm. Carotid pulses 2+, no bruits audible Respiratory: Clear to auscultation. No cyanosis, no use of accessory musculature GI: No organomegaly, abdomen is soft and non-tender Skin: No lesions in the area of chief complaint Neurologic: Sensation intact distally Psychiatric: Patient is competent for consent with normal mood and affect Musculoskeletal: No obvious deformities Extremities: 3+ left brachial and radial pulse with adequate cephalic vein in upper arm on vein mapping     Assessment/Plan:  Plan left brachial-cephalic AV fistula.Risk  of steal syndrome discussed with patient and he understands and accepts.   Tinnie Gens, MD 03/19/2014 7:32 AM

## 2014-03-19 NOTE — Op Note (Signed)
OPERATIVE REPORT  Date of Surgery: 03/19/2014  Surgeon: Tinnie Gens, MD  Assistant: Leontine Locket PA  Pre-op Diagnosis: Chronic kidney disease-stage IV  Post-op Diagnosis: Same  Procedure: Procedure(s): Creation left brachial-cephalic AV fistula  Anesthesia: Mac  EBL: Minimal  Complications: None  Procedure Details: Patient was taken the operative placed in supine position at which time left upper extremity was prepped Betadine scrub and solution draped in routine sterile manner. After infiltration forms and Xylocaine with epinephrine transverse incision was made in the antecubital area. Antecubital vein was dissected free. Cephalic branch was excellent in quality and size about 3 and at 4 mm. It was ligated distally transected and gently dilated with heparinized saline marked for orientation purposes. Brachial artery was exposed beneath the fashion circled Vesseloops. It had an excellent pulse was of good caliber about 3 and half millimeters in size. It was artery was occluded proximally and distally with Vesseloops open 15 blade extended with Potts scissors. The vein was carefully measured spatulated and anastomosed inside with 6-0 Prolene. There was excellent pulse and thrill in the fistula at the conclusion and a slightly diminished radial pulse but a palpable pulse with the fistula opening which did improve with compression of the fistula. Adequate hemostasis was achieved wound closed in layers with Vicryl in a subcuticular fashion with Dermabond patient taken to recovery room in stable condition   Tinnie Gens, MD 03/19/2014 8:45 AM

## 2014-03-19 NOTE — Discharge Instructions (Signed)
° ° °  03/19/2014 [name] [id number] [birth year]  Surgeon(s): Mal Misty, MD  Procedure(s): ARTERIOVENOUS (AV) FISTULA CREATION  x Do not stick fistula for 12 weeks   What to eat:  For your first meals, you should eat lightly; only small meals initially.  If you do not have nausea, you may eat larger meals.  Avoid spicy, greasy and heavy food.    General Anesthesia, Adult, Care After  Refer to this sheet in the next few weeks. These instructions provide you with information on caring for yourself after your procedure. Your health care provider may also give you more specific instructions. Your treatment has been planned according to current medical practices, but problems sometimes occur. Call your health care provider if you have any problems or questions after your procedure.  WHAT TO EXPECT AFTER THE PROCEDURE  After the procedure, it is typical to experience:  Sleepiness.  Nausea and vomiting. HOME CARE INSTRUCTIONS  For the first 24 hours after general anesthesia:  Have a responsible person with you.  Do not drive a car. If you are alone, do not take public transportation.  Do not drink alcohol.  Do not take medicine that has not been prescribed by your health care provider.  Do not sign important papers or make important decisions.  You may resume a normal diet and activities as directed by your health care provider.  Change bandages (dressings) as directed.  If you have questions or problems that seem related to general anesthesia, call the hospital and ask for the anesthetist or anesthesiologist on call. SEEK MEDICAL CARE IF:  You have nausea and vomiting that continue the day after anesthesia.  You develop a rash. SEEK IMMEDIATE MEDICAL CARE IF:  You have difficulty breathing.  You have chest pain.  You have any allergic problems. Document Released: 07/03/2000 Document Revised: 11/27/2012 Document Reviewed: 10/10/2012  [facility] Patient Information 2014  [place], [place].

## 2014-03-19 NOTE — Anesthesia Preprocedure Evaluation (Signed)
Anesthesia Evaluation    Reviewed: Allergy & Precautions, NPO status , Patient's Chart, lab work & pertinent test results  History of Anesthesia Complications Negative for: history of anesthetic complications  Airway        Dental   Pulmonary former smoker,          Cardiovascular hypertension, + CAD and + Peripheral Vascular Disease  a. cath 3/10: oLAD 25%, mLAD 25%; CFX 25%; oDx 80% (small - tx. medically); pRCA 25%, mRCA 25%; OM3 20%; b. Myoview 4/11: EF 53%, no scar or ischemia  c. MV 2012 Nl perfusion, apical thinning. No ischemia or scar. EF 49%, appears greater by visual estimate.; d. Dob stress echo 12/13: Negative Dob stress echo. There is no evidence of ischemia. The LVF is normal.   Neuro/Psych negative neurological ROS  negative psych ROS   GI/Hepatic Neg liver ROS, GERD-  ,  Endo/Other  diabetes  Renal/GU Dialysis and ESRFRenal disease     Musculoskeletal   Abdominal   Peds  Hematology   Anesthesia Other Findings   Reproductive/Obstetrics                             Anesthesia Physical Anesthesia Plan  ASA: III  Anesthesia Plan: General   Post-op Pain Management:    Induction: Intravenous  Airway Management Planned: LMA  Additional Equipment:   Intra-op Plan:   Post-operative Plan: Extubation in OR  Informed Consent: I have reviewed the patients History and Physical, chart, labs and discussed the procedure including the risks, benefits and alternatives for the proposed anesthesia with the patient or authorized representative who has indicated his/her understanding and acceptance.   Dental advisory given  Plan Discussed with: CRNA, Anesthesiologist and Surgeon  Anesthesia Plan Comments:         Anesthesia Quick Evaluation

## 2014-03-19 NOTE — Anesthesia Postprocedure Evaluation (Signed)
  Anesthesia Post-op Note  Patient: Jon Anderson  Procedure(s) Performed: Procedure(s): CREATION OF ARTERIOVENOUS (AV) FISTULA  LEFT UPPER ARM (Left)  Patient Location: PACU  Anesthesia Type:MAC  Level of Consciousness: awake  Airway and Oxygen Therapy: Patient Spontanous Breathing  Post-op Pain: mild  Post-op Assessment: Post-op Vital signs reviewed, Patient's Cardiovascular Status Stable, Respiratory Function Stable, Patent Airway, No signs of Nausea or vomiting and Pain level controlled  Post-op Vital Signs: Reviewed and stable  Last Vitals:  Filed Vitals:   03/19/14 1015  BP: 155/80  Pulse: 77  Temp:   Resp:     Complications: No apparent anesthesia complications

## 2014-03-19 NOTE — Transfer of Care (Signed)
Immediate Anesthesia Transfer of Care Note  Patient: Jon Anderson  Procedure(s) Performed: Procedure(s): CREATION OF ARTERIOVENOUS (AV) FISTULA  LEFT UPPER ARM (Left)  Patient Location: PACU  Anesthesia Type:MAC  Level of Consciousness: awake, alert , oriented and sedated  Airway & Oxygen Therapy: Patient Spontanous Breathing and Patient connected to nasal cannula oxygen  Post-op Assessment: Report given to PACU RN, Post -op Vital signs reviewed and stable and Patient moving all extremities  Post vital signs: Reviewed and stable  Complications: No apparent anesthesia complications

## 2014-03-20 ENCOUNTER — Encounter (HOSPITAL_COMMUNITY): Payer: Self-pay | Admitting: Vascular Surgery

## 2014-03-25 ENCOUNTER — Emergency Department (HOSPITAL_COMMUNITY): Payer: BC Managed Care – PPO

## 2014-03-25 ENCOUNTER — Encounter (HOSPITAL_COMMUNITY): Payer: Self-pay | Admitting: Emergency Medicine

## 2014-03-25 ENCOUNTER — Emergency Department (HOSPITAL_COMMUNITY)
Admission: EM | Admit: 2014-03-25 | Discharge: 2014-03-25 | Disposition: A | Discharge status: Home / Self Care | Payer: BC Managed Care – PPO | Attending: Emergency Medicine | Admitting: Emergency Medicine

## 2014-03-25 DIAGNOSIS — E785 Hyperlipidemia, unspecified: Secondary | ICD-10-CM | POA: Diagnosis not present

## 2014-03-25 DIAGNOSIS — Z7982 Long term (current) use of aspirin: Secondary | ICD-10-CM | POA: Diagnosis not present

## 2014-03-25 DIAGNOSIS — R0602 Shortness of breath: Secondary | ICD-10-CM | POA: Insufficient documentation

## 2014-03-25 DIAGNOSIS — Z8719 Personal history of other diseases of the digestive system: Secondary | ICD-10-CM | POA: Insufficient documentation

## 2014-03-25 DIAGNOSIS — R0789 Other chest pain: Secondary | ICD-10-CM | POA: Diagnosis not present

## 2014-03-25 DIAGNOSIS — E119 Type 2 diabetes mellitus without complications: Secondary | ICD-10-CM | POA: Insufficient documentation

## 2014-03-25 DIAGNOSIS — R2240 Localized swelling, mass and lump, unspecified lower limb: Secondary | ICD-10-CM | POA: Insufficient documentation

## 2014-03-25 DIAGNOSIS — G8929 Other chronic pain: Secondary | ICD-10-CM | POA: Insufficient documentation

## 2014-03-25 DIAGNOSIS — I2510 Atherosclerotic heart disease of native coronary artery without angina pectoris: Secondary | ICD-10-CM | POA: Insufficient documentation

## 2014-03-25 DIAGNOSIS — Z8781 Personal history of (healed) traumatic fracture: Secondary | ICD-10-CM | POA: Diagnosis not present

## 2014-03-25 DIAGNOSIS — I129 Hypertensive chronic kidney disease with stage 1 through stage 4 chronic kidney disease, or unspecified chronic kidney disease: Secondary | ICD-10-CM | POA: Diagnosis not present

## 2014-03-25 DIAGNOSIS — Z87891 Personal history of nicotine dependence: Secondary | ICD-10-CM | POA: Diagnosis not present

## 2014-03-25 DIAGNOSIS — Z7902 Long term (current) use of antithrombotics/antiplatelets: Secondary | ICD-10-CM | POA: Insufficient documentation

## 2014-03-25 DIAGNOSIS — N184 Chronic kidney disease, stage 4 (severe): Secondary | ICD-10-CM | POA: Diagnosis not present

## 2014-03-25 DIAGNOSIS — Z9889 Other specified postprocedural states: Secondary | ICD-10-CM | POA: Diagnosis not present

## 2014-03-25 LAB — COMPREHENSIVE METABOLIC PANEL
ALT: 8 U/L (ref 0–53)
AST: 9 U/L (ref 0–37)
Albumin: 3 g/dL — ABNORMAL LOW (ref 3.5–5.2)
Alkaline Phosphatase: 116 U/L (ref 39–117)
Anion gap: 15 (ref 5–15)
BUN: 48 mg/dL — ABNORMAL HIGH (ref 6–23)
CO2: 19 mEq/L (ref 19–32)
Calcium: 8.1 mg/dL — ABNORMAL LOW (ref 8.4–10.5)
Chloride: 103 mEq/L (ref 96–112)
Creatinine, Ser: 6.39 mg/dL — ABNORMAL HIGH (ref 0.50–1.35)
GFR calc Af Amer: 10 mL/min — ABNORMAL LOW (ref >90)
GFR calc non Af Amer: 8 mL/min — ABNORMAL LOW (ref >90)
Glucose, Bld: 125 mg/dL — ABNORMAL HIGH (ref 70–99)
Potassium: 4 mEq/L (ref 3.7–5.3)
Sodium: 137 mEq/L (ref 137–147)
Total Bilirubin: 0.3 mg/dL (ref 0.3–1.2)
Total Protein: 6.5 g/dL (ref 6.0–8.3)

## 2014-03-25 LAB — D-DIMER, QUANTITATIVE (NOT AT ARMC): D-Dimer, Quant: 2.08 ug/mL-FEU — ABNORMAL HIGH (ref 0.00–0.48)

## 2014-03-25 LAB — CBC WITH DIFFERENTIAL/PLATELET
Basophils Absolute: 0 10*3/uL (ref 0.0–0.1)
Basophils Relative: 0 % (ref 0–1)
Eosinophils Absolute: 0.2 10*3/uL (ref 0.0–0.7)
Eosinophils Relative: 4 % (ref 0–5)
HCT: 29.1 % — ABNORMAL LOW (ref 39.0–52.0)
Hemoglobin: 9.4 g/dL — ABNORMAL LOW (ref 13.0–17.0)
Lymphocytes Relative: 30 % (ref 12–46)
Lymphs Abs: 1.5 10*3/uL (ref 0.7–4.0)
MCH: 27.7 pg (ref 26.0–34.0)
MCHC: 32.3 g/dL (ref 30.0–36.0)
MCV: 85.8 fL (ref 78.0–100.0)
Monocytes Absolute: 0.4 10*3/uL (ref 0.1–1.0)
Monocytes Relative: 9 % (ref 3–12)
Neutro Abs: 2.8 10*3/uL (ref 1.7–7.7)
Neutrophils Relative %: 57 % (ref 43–77)
Platelets: 249 10*3/uL (ref 150–400)
RBC: 3.39 MIL/uL — ABNORMAL LOW (ref 4.22–5.81)
RDW: 14.2 % (ref 11.5–15.5)
WBC: 5 10*3/uL (ref 4.0–10.5)

## 2014-03-25 LAB — PROTIME-INR
INR: 1.09 (ref 0.00–1.49)
Prothrombin Time: 14.2 seconds (ref 11.6–15.2)

## 2014-03-25 LAB — PRO B NATRIURETIC PEPTIDE: Pro B Natriuretic peptide (BNP): 4548 pg/mL — ABNORMAL HIGH (ref 0–125)

## 2014-03-25 LAB — TROPONIN I: Troponin I: <0.3 ng/mL (ref <0.30)

## 2014-03-25 MED ORDER — TECHNETIUM TC 99M DIETHYLENETRIAME-PENTAACETIC ACID: 40.0000 | Freq: Once | INTRAVENOUS | Status: DC | PRN

## 2014-03-25 MED ORDER — ASPIRIN 81 MG PO CHEW
324.0000 mg | CHEWABLE_TABLET | Freq: Once | ORAL | Status: AC
  Administered 2014-03-25: 243 mg via ORAL
  Filled 2014-03-25: qty 4

## 2014-03-25 MED ORDER — IPRATROPIUM-ALBUTEROL 0.5-2.5 (3) MG/3ML IN SOLN
3.0000 mL | Freq: Once | RESPIRATORY_TRACT | Status: AC
  Administered 2014-03-25: 3 mL via RESPIRATORY_TRACT
  Filled 2014-03-25: qty 3

## 2014-03-25 MED ORDER — TECHNETIUM TO 99M ALBUMIN AGGREGATED
6.0000 | Freq: Once | INTRAVENOUS | Status: AC | PRN
  Administered 2014-03-25: 6 via INTRAVENOUS

## 2014-03-25 MED ORDER — FUROSEMIDE 40 MG PO TABS: 40.0000 mg | ORAL_TABLET | Freq: Every day | ORAL | Status: DC

## 2014-03-25 MED ORDER — FUROSEMIDE 10 MG/ML IJ SOLN
40.0000 mg | INTRAMUSCULAR | Status: AC
  Administered 2014-03-25: 40 mg via INTRAVENOUS
  Filled 2014-03-25: qty 4

## 2014-03-25 NOTE — ED Notes (Signed)
Pt c/o CP and SOB; pt sts some HA today as well

## 2014-03-25 NOTE — ED Provider Notes (Signed)
CSN: [id number]     Arrival date & time 03/25/14  0912 History   First MD Initiated Contact with Patient 03/25/14 [id number]     Chief Complaint  Patient presents with  . Shortness of Breath  . Chest Pain     (Consider location/radiation/quality/duration/timing/severity/associated sxs/prior Treatment) HPI Comments: Patient is a 63 year old male past medical history significant for DM, HLD, HTN, CAD, chronic chest pain, GERD, CKD (about to start dialysis) presenting to the ED for 2 days of gradually worsening left-sided chest pressure with associated shortness of breath. He states his shortness of breath became significantly worse at 8:30 AM this morning. No modifying factors identified. Patient states he has been seen for similar symptoms but has not been told the cause of his symptoms. He does endorse generalized headache that began today. Patient also is complaining of left calf swelling/pain.    Past Medical History  Diagnosis Date  . Diabetes mellitus   . Hyperlipidemia   . HTN (hypertension)     echo 3/10: EF 60%, LAE  . CAD (coronary artery disease)     a. cath 3/10: oLAD 25%, mLAD 25%; CFX 25%; oDx 80% (small - tx. medically); pRCA 25%, mRCA 25%; OM3 20%;  b.  Myoview 4/11: EF 53%, no scar or ischemia   c. MV 2012 Nl perfusion, apical thinning.  No ischemia or scar.  EF 49%, appears greater by visual estimate.;  d.  Dob stress echo 12/13:  Negative Dob stress echo. There is no evidence of ischemia.  The LVF is normal.  . Chronic chest pain   . GERD (gastroesophageal reflux disease)   . Nephrolithiasis   . CKD (chronic kidney disease), stage IV     a. 05/2012 Renal u/s: medical renal dzs;  b. seen by [facility]  . Carotid stenosis     a. dopplers 05/2012:  [id number] RICA; [id number] LICA => repeat in 6 mos  . Snores     a. presumed OSA, pt has refused sleep eval in past.  . Peripheral arterial disease     high-grade distal left SFA by duplex ultrasound   Past Surgical History   Procedure Laterality Date  . Tonsillectomy    . Adenoidectomy    . Cardiac catheterization  2001 and 2010   . Angioplasty / stenting femoral Left 12/11/2013    dr berry  . Hernia repair    . Fracture surgery      right foot  . Colonoscopy w/ biopsies and polypectomy    . Lower extremity angiogram Left 12/11/2013    Procedure: LOWER EXTREMITY ANGIOGRAM;  Surgeon: Lorretta Harp, MD;  Location: [facility] CATH LAB;  Service: Cardiovascular;  Laterality: Left;  . Av fistula placement Left 03/19/2014    Procedure: CREATION OF ARTERIOVENOUS (AV) FISTULA  LEFT UPPER ARM;  Surgeon: Mal Misty, MD;  Location: [facility] OR;  Service: Vascular;  Laterality: Left;   Family History  Problem Relation Age of Onset  . Heart attack Sister     died @ 69  . Cancer Mother     died @ 41  . Diabetes Brother     deceased   History  Substance Use Topics  . Smoking status: Former Research scientist (life sciences)  . Smokeless tobacco: Never Used  . Alcohol Use: No    Review of Systems  Constitutional: Negative for fever and chills.  HENT: Negative for congestion and rhinorrhea.   Respiratory: Positive for shortness of breath. Negative for cough and wheezing.   Cardiovascular: Positive for  chest pain and leg swelling.  Gastrointestinal: Negative for nausea, vomiting, abdominal pain and diarrhea.  All other systems reviewed and are negative.     Allergies  Kiwi extract  Home Medications   Prior to Admission medications   Medication Sig Start Date End Date Taking? Authorizing Provider  aspirin EC 81 MG tablet Take 1 tablet (81 mg total) by mouth daily. 05/28/12  Yes Scott Joylene Draft, PA-C  atorvastatin (LIPITOR) 40 MG tablet Take 1 tablet (40 mg total) by mouth daily at 6 PM. 12/13/13  Yes Brett Canales, PA-C  carvedilol (COREG) 25 MG tablet Take 25 mg by mouth 2 (two) times daily with a meal.   Yes Historical Provider, MD  Cholecalciferol (VITAMIN D PO) Take 1 tablet by mouth daily.    Yes Historical Provider, MD  cloNIDine  (CATAPRES) 0.3 MG tablet Take 1 tablet (0.3 mg total) by mouth 2 (two) times daily. 03/20/12  Yes Rhonda G Barrett, PA-C  clopidogrel (PLAVIX) 75 MG tablet Take 1 tablet (75 mg total) by mouth daily with breakfast. 12/13/13  Yes Einar Pheasant Hager, PA-C  cyclobenzaprine (FLEXERIL) 10 MG tablet Take 10 mg by mouth at bedtime.    Yes Historical Provider, MD  insulin aspart (NOVOLOG) 100 UNIT/ML injection Inject into the skin continuous. Per insulin pump 03/20/12  Yes Rhonda G Barrett, PA-C  isosorbide-hydrALAZINE (BIDIL) 20-37.5 MG per tablet Take 1 tablet by mouth 2 (two) times daily.   Yes Historical Provider, MD  amLODipine (NORVASC) 10 MG tablet Take 10 mg by mouth daily. 01/15/14   Historical Provider, MD  furosemide (LASIX) 40 MG tablet Take 1 tablet (40 mg total) by mouth daily. 03/25/14   Adine Heimann L Jaedan Huttner, PA-C  HYDROcodone-acetaminophen (NORCO) 5-325 MG per tablet Take 1 tablet by mouth every 6 (six) hours as needed for moderate pain. 03/19/14   Samantha J Rhyne, PA-C  nitroGLYCERIN (NITROSTAT) 0.4 MG SL tablet Place 1 tablet (0.4 mg total) under the tongue every 5 (five) minutes as needed for chest pain. 03/20/12   Rhonda G Barrett, PA-C   BP 170/75 mmHg  Pulse 90  Temp(Src) 98.1 F (36.7 C) (Oral)  Resp 15  SpO2 100% Physical Exam  Constitutional: He is oriented to person, place, and time. He appears well-developed and well-nourished.  HENT:  Head: Normocephalic and atraumatic.  Right Ear: External ear normal.  Left Ear: External ear normal.  Nose: Nose normal.  Mouth/Throat: Oropharynx is clear and moist. No oropharyngeal exudate.  Eyes: Conjunctivae are normal.  Neck: Neck supple.  Cardiovascular: Normal rate, regular rhythm and normal heart sounds.   Pulmonary/Chest: Accessory muscle usage present. Tachypnea noted. He has decreased breath sounds.  Abdominal: Soft. There is no tenderness.  Musculoskeletal:  Left calf swollen > R calf. TTP. No erythema or warmth.    Neurological: He is alert and oriented to person, place, and time.  Skin: Skin is warm and dry.  Nursing note and vitals reviewed.   ED Course  Procedures (including critical care time) Medications  technetium TC 27M diethylenetriame-pentaacetic acid (DTPA) injection 40 milli Curie (not administered)  aspirin chewable tablet 324 mg (243 mg Oral Given 03/25/14 1106)  furosemide (LASIX) injection 40 mg (40 mg Intravenous Given 03/25/14 1107)  ipratropium-albuterol (DUONEB) 0.5-2.5 (3) MG/3ML nebulizer solution 3 mL (3 mLs Nebulization Given 03/25/14 1337)  technetium albumin aggregated (MAA) injection solution 6 milli Curie (6 milli Curies Intravenous Contrast Given 03/25/14 1440)    Labs Review Labs Reviewed  CBC WITH DIFFERENTIAL -  Abnormal; Notable for the following:    RBC 3.39 (*)    Hemoglobin 9.4 (*)    HCT 29.1 (*)    All other components within normal limits  COMPREHENSIVE METABOLIC PANEL - Abnormal; Notable for the following:    Glucose, Bld 125 (*)    BUN 48 (*)    Creatinine, Ser 6.39 (*)    Calcium 8.1 (*)    Albumin 3.0 (*)    GFR calc non Af Amer 8 (*)    GFR calc Af Amer 10 (*)    All other components within normal limits  PRO B NATRIURETIC PEPTIDE - Abnormal; Notable for the following:    Pro B Natriuretic peptide (BNP) 4548.0 (*)    All other components within normal limits  D-DIMER, QUANTITATIVE - Abnormal; Notable for the following:    D-Dimer, Quant 2.08 (*)    All other components within normal limits  TROPONIN I  PROTIME-INR    Imaging Review Dg Chest 2 View  03/25/2014   CLINICAL DATA:  63 year old male with chest pain and shortness of Breath beginning this morning. Initial encounter.  EXAM: CHEST  2 VIEW  COMPARISON:  12/05/2013 and earlier.  FINDINGS: Lower lung volumes with increased posterior basal segment lower lobe opacity suspected on the right. No definite pleural effusion. Mild crowding of markings elsewhere. Mild exaggeration of the  cardiac silhouette. Other mediastinal contours are within normal limits. Visualized tracheal air column is within normal limits. No pneumothorax or edema. No acute osseous abnormality identified. Calcified atherosclerosis of the aorta.  IMPRESSION: Lower lung volumes with nonspecific right lower lobe opacity. Query pneumonia. No pleural effusion identified.  Post treatment radiographs recommended to document resolution.   Electronically Signed   By: Lars Pinks M.D.   On: 03/25/2014 10:11   Nm Pulmonary Perf And Vent  03/25/2014   CLINICAL DATA:  Shortness of breath.  EXAM: NUCLEAR MEDICINE VENTILATION - PERFUSION LUNG SCAN  TECHNIQUE: Ventilation images were obtained in multiple projections using inhaled aerosol technetium 99 M DTPA. Perfusion images were obtained in multiple projections after intravenous injection of Tc-25m MAA.  RADIOPHARMACEUTICALS:  40 mCi Tc-91m DTPA aerosol and 6 mCi Tc-16m MAA  COMPARISON:  Chest x-ray dated 03/25/2014  FINDINGS: Ventilation: No focal ventilation defect.  Perfusion: No wedge shaped peripheral perfusion defects to suggest acute pulmonary embolism.  IMPRESSION: Normal ventilation perfusion lung scan. Low probability for pulmonary embolism.   Electronically Signed   By: Rozetta Nunnery M.D.   On: 03/25/2014 14:48     EKG Interpretation   Date/Time:  Wednesday March 25 2014 09:19:21 EST Ventricular Rate:  80 PR Interval:  130 QRS Duration: 96 QT Interval:  392 QTC Calculation: 452 R Axis:   47 Text Interpretation:  Normal sinus rhythm Normal ECG No significant change  was found Confirmed by CAMPOS  MD, Lennette Bihari ([id number]) on 03/25/2014 10:50:54 AM  Also confirmed by Venora Maples  MD, Lennette Bihari ([id number])  on 03/25/2014 12:10:41 PM      MDM   Final diagnoses:  Shortness of breath    Filed Vitals:   03/25/14 1533  BP:   Pulse: 90  Temp:   Resp:    Afebrile, NAD, non-toxic appearing, AAOx4.  I have reviewed nursing notes, vital signs, and all appropriate lab and  imaging results for this patient. D-dimer elevated, low risk on VQ scan. Troponin negative, EKG unremarkable. CXR reviewed, little to no clinical suspicion for PNA given no clinical supporting evidence. Patient feeling better after IV lasix.  Resting comfortably. No tachypnea. No signs of respiratory distress.  BNP elevated likely partially elevated d/t CKD. Will advise patient to take 40mg  lasix BID for the next five days and follow up with his PCP and cardiologist. Patient able to ambulate without change in normal exertional SOB, he maintained nml heart rate with 100% oxygenation on room air. Patient is agreeable to discharge home. Return precautions discussed. Patient is stable at time of discharge Patient d/w with Dr. Venora Maples, agrees with plan.      Harlow Mares, PA-C 03/25/14 [place], MD 03/25/14 [phone]

## 2014-03-25 NOTE — Discharge Instructions (Signed)
Please follow up with your primary care physician in 1-2 days. If you do not have one please call the [place] number listed above. Please take your Lasix 40 mg twice a day for five days. Please follow up with your cardiologist to schedule a follow up appointment. Please read all discharge instructions and return precautions.    Shortness of Breath Shortness of breath means you have trouble breathing. It could also mean that you have a medical problem. You should get immediate medical care for shortness of breath. CAUSES   Not enough oxygen in the air such as with high altitudes or a smoke-filled room.  Certain lung diseases, infections, or problems.  Heart disease or conditions, such as angina or heart failure.  Low red blood cells (anemia).  Poor physical fitness, which can cause shortness of breath when you exercise.  Chest or back injuries or stiffness.  Being overweight.  Smoking.  Anxiety, which can make you feel like you are not getting enough air. DIAGNOSIS  Serious medical problems can often be found during your physical exam. Tests may also be done to determine why you are having shortness of breath. Tests may include:  Chest X-rays.  Lung function tests.  Blood tests.  An electrocardiogram (ECG).  An ambulatory electrocardiogram. An ambulatory ECG records your heartbeat patterns over a 24-hour period.  Exercise testing.  A transthoracic echocardiogram (TTE). During echocardiography, sound waves are used to evaluate how blood flows through your heart.  A transesophageal echocardiogram (TEE).  Imaging scans. Your health care provider may not be able to find a cause for your shortness of breath after your exam. In this case, it is important to have a follow-up exam with your health care provider as directed.  TREATMENT  Treatment for shortness of breath depends on the cause of your symptoms and can vary greatly. HOME CARE INSTRUCTIONS    Do not smoke. Smoking is a common cause of shortness of breath. If you smoke, ask for help to quit.  Avoid being around chemicals or things that may bother your breathing, such as paint fumes and dust.  Rest as needed. Slowly resume your usual activities.  If medicines were prescribed, take them as directed for the full length of time directed. This includes oxygen and any inhaled medicines.  Keep all follow-up appointments as directed by your health care provider. SEEK MEDICAL CARE IF:   Your condition does not improve in the time expected.  You have a hard time doing your normal activities even with rest.  You have any new symptoms. SEEK IMMEDIATE MEDICAL CARE IF:   Your shortness of breath gets worse.  You feel light-headed, faint, or develop a cough not controlled with medicines.  You start coughing up blood.  You have pain with breathing.  You have chest pain or pain in your arms, shoulders, or abdomen.  You have a fever.  You are unable to walk up stairs or exercise the way you normally do. MAKE SURE YOU:  Understand these instructions.  Will watch your condition.  Will get help right away if you are not doing well or get worse. Document Released: 12/20/2000 Document Revised: 04/01/2013 Document Reviewed: 06/12/2011 [facility] Patient Information 2015 [place], [place]. This information is not intended to replace advice given to you by your health care provider. Make sure you discuss any questions you have with your health care provider.

## 2014-03-25 NOTE — ED Notes (Signed)
These are the patients vitals while ambulating.

## 2014-03-25 NOTE — ED Notes (Signed)
PA at bedside.

## 2014-04-08 ENCOUNTER — Other Ambulatory Visit (HOSPITAL_COMMUNITY): Payer: Self-pay

## 2014-04-08 ENCOUNTER — Encounter: Payer: Self-pay | Admitting: *Deleted

## 2014-04-13 ENCOUNTER — Encounter (HOSPITAL_COMMUNITY)
Admission: RE | Admit: 2014-04-13 | Discharge: 2014-04-13 | Disposition: A | Discharge status: Home / Self Care | Payer: BC Managed Care – PPO | Source: Ambulatory Visit | Attending: Nephrology | Admitting: Nephrology

## 2014-04-13 DIAGNOSIS — N184 Chronic kidney disease, stage 4 (severe): Secondary | ICD-10-CM | POA: Diagnosis not present

## 2014-04-13 DIAGNOSIS — D638 Anemia in other chronic diseases classified elsewhere: Secondary | ICD-10-CM | POA: Insufficient documentation

## 2014-04-13 LAB — COMPREHENSIVE METABOLIC PANEL
ALT: 14 U/L (ref 0–53)
AST: 16 U/L (ref 0–37)
Albumin: 3.5 g/dL (ref 3.5–5.2)
Alkaline Phosphatase: 107 U/L (ref 39–117)
Anion gap: 9 (ref 5–15)
BUN: 65 mg/dL — ABNORMAL HIGH (ref 6–23)
CO2: 23 mmol/L (ref 19–32)
Calcium: 7.8 mg/dL — ABNORMAL LOW (ref 8.4–10.5)
Chloride: 109 mEq/L (ref 96–112)
Creatinine, Ser: 7.66 mg/dL — ABNORMAL HIGH (ref 0.50–1.35)
GFR calc Af Amer: 8 mL/min — ABNORMAL LOW (ref >90)
GFR calc non Af Amer: 7 mL/min — ABNORMAL LOW (ref >90)
Glucose, Bld: 84 mg/dL (ref 70–99)
Potassium: 3.9 mmol/L (ref 3.5–5.1)
Sodium: 141 mmol/L (ref 135–145)
Total Bilirubin: 0.4 mg/dL (ref 0.3–1.2)
Total Protein: 7.1 g/dL (ref 6.0–8.3)

## 2014-04-13 LAB — CBC
HCT: 34.5 % — ABNORMAL LOW (ref 39.0–52.0)
Hemoglobin: 11 g/dL — ABNORMAL LOW (ref 13.0–17.0)
MCH: 27 pg (ref 26.0–34.0)
MCHC: 31.9 g/dL (ref 30.0–36.0)
MCV: 84.6 fL (ref 78.0–100.0)
Platelets: 202 10*3/uL (ref 150–400)
RBC: 4.08 MIL/uL — ABNORMAL LOW (ref 4.22–5.81)
RDW: 14 % (ref 11.5–15.5)
WBC: 4.9 10*3/uL (ref 4.0–10.5)

## 2014-04-13 LAB — IRON AND TIBC
Iron: 76 ug/dL (ref 42–165)
Saturation Ratios: 26 % (ref 20–55)
TIBC: 294 ug/dL (ref 215–435)
UIBC: 218 ug/dL (ref 125–400)

## 2014-04-13 LAB — FERRITIN: Ferritin: 30 ng/mL (ref 22–322)

## 2014-04-13 MED ORDER — DARBEPOETIN ALFA 60 MCG/0.3ML IJ SOSY
PREFILLED_SYRINGE | INTRAMUSCULAR | Status: AC
  Filled 2014-04-13: qty 0.3

## 2014-04-13 MED ORDER — DARBEPOETIN ALFA 60 MCG/0.3ML IJ SOSY
60.0000 ug | PREFILLED_SYRINGE | INTRAMUSCULAR | Status: DC
  Administered 2014-04-13: 60 ug via SUBCUTANEOUS

## 2014-04-13 MED ORDER — SODIUM CHLORIDE 0.9 % IV SOLN
510.0000 mg | INTRAVENOUS | Status: DC
  Administered 2014-04-13: 510 mg via INTRAVENOUS
  Filled 2014-04-13: qty 17

## 2014-04-14 LAB — HEPATITIS B SURFACE ANTIGEN: Hepatitis B Surface Ag: NEGATIVE

## 2014-04-20 ENCOUNTER — Encounter (HOSPITAL_COMMUNITY)
Admission: RE | Admit: 2014-04-20 | Discharge: 2014-04-20 | Disposition: A | Discharge status: Home / Self Care | Payer: BC Managed Care – PPO | Source: Ambulatory Visit | Attending: Nephrology | Admitting: Nephrology

## 2014-04-20 DIAGNOSIS — D638 Anemia in other chronic diseases classified elsewhere: Secondary | ICD-10-CM | POA: Diagnosis not present

## 2014-04-20 MED ORDER — SODIUM CHLORIDE 0.9 % IV SOLN
510.0000 mg | INTRAVENOUS | Status: DC
  Administered 2014-04-20: 510 mg via INTRAVENOUS
  Filled 2014-04-20: qty 17

## 2014-04-28 ENCOUNTER — Encounter: Payer: Self-pay | Admitting: Podiatry

## 2014-04-28 ENCOUNTER — Ambulatory Visit (INDEPENDENT_AMBULATORY_CARE_PROVIDER_SITE_OTHER): Payer: BC Managed Care – PPO | Admitting: Podiatry

## 2014-04-28 ENCOUNTER — Ambulatory Visit (HOSPITAL_COMMUNITY)
Admit: 2014-04-28 | Discharge: 2014-04-28 | Disposition: A | Discharge status: Home / Self Care | Payer: BC Managed Care – PPO | Source: Ambulatory Visit | Attending: Vascular Surgery | Admitting: Vascular Surgery

## 2014-04-28 VITALS — BP 134/68 | HR 80 | Resp 20

## 2014-04-28 DIAGNOSIS — N186 End stage renal disease: Secondary | ICD-10-CM | POA: Diagnosis present

## 2014-04-28 DIAGNOSIS — B351 Tinea unguium: Secondary | ICD-10-CM

## 2014-04-28 DIAGNOSIS — M79673 Pain in unspecified foot: Secondary | ICD-10-CM

## 2014-04-28 DIAGNOSIS — Z4931 Encounter for adequacy testing for hemodialysis: Secondary | ICD-10-CM | POA: Diagnosis not present

## 2014-04-28 DIAGNOSIS — Q828 Other specified congenital malformations of skin: Secondary | ICD-10-CM

## 2014-04-28 DIAGNOSIS — E1142 Type 2 diabetes mellitus with diabetic polyneuropathy: Secondary | ICD-10-CM

## 2014-04-28 DIAGNOSIS — M2040 Other hammer toe(s) (acquired), unspecified foot: Secondary | ICD-10-CM

## 2014-04-28 NOTE — Progress Notes (Signed)
He presents today with chief complaint of painful calluses to the plantar aspect of bilateral foot and painfully elongated toenails 1 through 5 bilateral. He would also like to pick at his diabetic shoes today.  Objective: Vital signs are stable he is alert and oriented 3. Diabetic shoes are too narrow for his swollen feet so we will send back and request another pair. His nails are thick yellow dystrophic with mycotic and painful palpation as well as debridement. He had a porokeratotic lesion plantar aspect is sub-fifth metatarsals bilaterally which I didn't nucleated.  Assessment diabetes mellitus with hammertoe deformity diabetic peripheral neuropathy. Porokeratosis bilateral and pain in limb secondary to onychomycosis.  Plan: Debrided all nails 1 through 5 bilateral. His covered service secondary to pain we also debrided the porokeratotic lesions. We also ordered him a  Diabetic shoes.

## 2014-04-28 NOTE — Patient Instructions (Signed)

## 2014-05-04 ENCOUNTER — Encounter: Payer: Self-pay | Admitting: Vascular Surgery

## 2014-05-05 ENCOUNTER — Ambulatory Visit (INDEPENDENT_AMBULATORY_CARE_PROVIDER_SITE_OTHER): Payer: Self-pay | Admitting: Vascular Surgery

## 2014-05-05 ENCOUNTER — Encounter: Payer: Self-pay | Admitting: Vascular Surgery

## 2014-05-05 VITALS — BP 157/71 | HR 73 | Resp 16 | Ht 71.0 in | Wt 193.0 lb

## 2014-05-05 DIAGNOSIS — N184 Chronic kidney disease, stage 4 (severe): Secondary | ICD-10-CM

## 2014-05-05 NOTE — Progress Notes (Signed)
Subjective:     Patient ID: Jon Anderson, male   DOB: [birth year], 64 y.o.   MRN: [id number]  HPI this 64 year old male returns for follow-up regarding his left brachial-cephalic AV fistula created on 03/19/2014. Patient has stage IV chronic kidney disease but has not been on hemodialysis. He denies pain in the left hand. He had occasionally will have some tingling in the third fourth and fifth digits. He states that occasionally his hand will "lock up". It is not keeping him awake at night. He is right-handed.   Review of Systems     Objective:   Physical Exam BP 157/71 mmHg  Pulse 73  Resp 16  Ht 5\' 11"  (1.803 m)  Wt 193 lb (87.544 kg)  BMI 26.93 kg/m2  Gen. well-developed well-nourished male no apparent stress alert and oriented 3 Left brachial-cephalic AV fistula with excellent pulse and palpable thrill to proximal upper arm. 2+ radial pulse palpable. Left hand appears well perfused.  Today I reviewed the duplex scan of the fistula which was performed 04/28/2014. This revealed the fistula to be widely patent with excellent flow. There is a small branch extending laterally from the vein near the arterial anastomosis. I performed a bedside sono site ultrasound exam and do not feel that the branches are of significant size to require ligation     Assessment:     Nicely developing left brachial-cephalic AV fistula in patient with stage IV chronic kidney disease    Plan:     Okay to use fistula 06/18/2014 if necessary Will return to see me on when necessary basis I did discuss the fact that if he developed progressive numbness or pain in the left hand he should notify us for further evaluation. That does not appear to be the case at the present time.

## 2014-05-11 ENCOUNTER — Inpatient Hospital Stay (HOSPITAL_COMMUNITY): Admission: RE | Admit: 2014-05-11 | Payer: BC Managed Care – PPO | Source: Ambulatory Visit

## 2014-05-12 ENCOUNTER — Ambulatory Visit (INDEPENDENT_AMBULATORY_CARE_PROVIDER_SITE_OTHER): Payer: BC Managed Care – PPO | Admitting: Podiatry

## 2014-05-12 DIAGNOSIS — L84 Corns and callosities: Secondary | ICD-10-CM

## 2014-05-12 DIAGNOSIS — M2040 Other hammer toe(s) (acquired), unspecified foot: Secondary | ICD-10-CM | POA: Diagnosis not present

## 2014-05-12 DIAGNOSIS — E1142 Type 2 diabetes mellitus with diabetic polyneuropathy: Secondary | ICD-10-CM

## 2014-05-12 DIAGNOSIS — E1140 Type 2 diabetes mellitus with diabetic neuropathy, unspecified: Secondary | ICD-10-CM

## 2014-05-12 NOTE — Progress Notes (Signed)
He presents today to pick up his diabetic shoes. He was given both oral and written home-going instructions for the care and use of his diabetic shoes. We will follow-up with him in 1 month.

## 2014-05-12 NOTE — Patient Instructions (Signed)

## 2014-06-12 ENCOUNTER — Other Ambulatory Visit: Payer: Self-pay | Admitting: Internal Medicine

## 2014-06-12 ENCOUNTER — Ambulatory Visit
Admission: RE | Admit: 2014-06-12 | Discharge: 2014-06-12 | Disposition: A | Discharge status: Home / Self Care | Payer: BC Managed Care – PPO | Source: Ambulatory Visit | Attending: Internal Medicine | Admitting: Internal Medicine

## 2014-06-12 DIAGNOSIS — R0602 Shortness of breath: Secondary | ICD-10-CM

## 2014-06-12 DIAGNOSIS — R05 Cough: Secondary | ICD-10-CM

## 2014-06-12 DIAGNOSIS — R059 Cough, unspecified: Secondary | ICD-10-CM

## 2014-06-16 ENCOUNTER — Encounter (HOSPITAL_COMMUNITY): Payer: Self-pay | Admitting: *Deleted

## 2014-06-16 ENCOUNTER — Inpatient Hospital Stay (HOSPITAL_COMMUNITY)
Admission: EM | Admit: 2014-06-16 | Discharge: 2014-06-23 | DRG: 286 | Disposition: A | Discharge status: Home / Self Care | Payer: BC Managed Care – PPO | Attending: Internal Medicine | Admitting: Internal Medicine

## 2014-06-16 ENCOUNTER — Emergency Department (HOSPITAL_COMMUNITY): Payer: BC Managed Care – PPO

## 2014-06-16 DIAGNOSIS — R079 Chest pain, unspecified: Secondary | ICD-10-CM | POA: Diagnosis present

## 2014-06-16 DIAGNOSIS — E785 Hyperlipidemia, unspecified: Secondary | ICD-10-CM | POA: Diagnosis present

## 2014-06-16 DIAGNOSIS — N189 Chronic kidney disease, unspecified: Secondary | ICD-10-CM

## 2014-06-16 DIAGNOSIS — E8770 Fluid overload, unspecified: Secondary | ICD-10-CM | POA: Diagnosis present

## 2014-06-16 DIAGNOSIS — Z713 Dietary counseling and surveillance: Secondary | ICD-10-CM | POA: Diagnosis present

## 2014-06-16 DIAGNOSIS — Z7982 Long term (current) use of aspirin: Secondary | ICD-10-CM

## 2014-06-16 DIAGNOSIS — Z992 Dependence on renal dialysis: Secondary | ICD-10-CM | POA: Diagnosis not present

## 2014-06-16 DIAGNOSIS — I120 Hypertensive chronic kidney disease with stage 5 chronic kidney disease or end stage renal disease: Secondary | ICD-10-CM | POA: Diagnosis present

## 2014-06-16 DIAGNOSIS — G4733 Obstructive sleep apnea (adult) (pediatric): Secondary | ICD-10-CM | POA: Diagnosis present

## 2014-06-16 DIAGNOSIS — N186 End stage renal disease: Secondary | ICD-10-CM | POA: Diagnosis present

## 2014-06-16 DIAGNOSIS — D638 Anemia in other chronic diseases classified elsewhere: Secondary | ICD-10-CM | POA: Diagnosis present

## 2014-06-16 DIAGNOSIS — Z91018 Allergy to other foods: Secondary | ICD-10-CM | POA: Diagnosis not present

## 2014-06-16 DIAGNOSIS — J811 Chronic pulmonary edema: Secondary | ICD-10-CM | POA: Diagnosis present

## 2014-06-16 DIAGNOSIS — K219 Gastro-esophageal reflux disease without esophagitis: Secondary | ICD-10-CM | POA: Diagnosis present

## 2014-06-16 DIAGNOSIS — Z87891 Personal history of nicotine dependence: Secondary | ICD-10-CM | POA: Diagnosis not present

## 2014-06-16 DIAGNOSIS — N179 Acute kidney failure, unspecified: Secondary | ICD-10-CM | POA: Diagnosis present

## 2014-06-16 DIAGNOSIS — Z7902 Long term (current) use of antithrombotics/antiplatelets: Secondary | ICD-10-CM | POA: Diagnosis not present

## 2014-06-16 DIAGNOSIS — Z6825 Body mass index (BMI) 25.0-25.9, adult: Secondary | ICD-10-CM

## 2014-06-16 DIAGNOSIS — Z9861 Coronary angioplasty status: Secondary | ICD-10-CM

## 2014-06-16 DIAGNOSIS — I6529 Occlusion and stenosis of unspecified carotid artery: Secondary | ICD-10-CM | POA: Diagnosis present

## 2014-06-16 DIAGNOSIS — I10 Essential (primary) hypertension: Secondary | ICD-10-CM | POA: Diagnosis present

## 2014-06-16 DIAGNOSIS — I2510 Atherosclerotic heart disease of native coronary artery without angina pectoris: Secondary | ICD-10-CM | POA: Diagnosis present

## 2014-06-16 DIAGNOSIS — R0789 Other chest pain: Secondary | ICD-10-CM | POA: Diagnosis present

## 2014-06-16 DIAGNOSIS — E119 Type 2 diabetes mellitus without complications: Secondary | ICD-10-CM | POA: Diagnosis present

## 2014-06-16 DIAGNOSIS — Z794 Long term (current) use of insulin: Secondary | ICD-10-CM

## 2014-06-16 DIAGNOSIS — E669 Obesity, unspecified: Secondary | ICD-10-CM | POA: Diagnosis present

## 2014-06-16 DIAGNOSIS — I739 Peripheral vascular disease, unspecified: Secondary | ICD-10-CM | POA: Diagnosis present

## 2014-06-16 DIAGNOSIS — R0600 Dyspnea, unspecified: Secondary | ICD-10-CM

## 2014-06-16 HISTORY — DX: Pneumonia, unspecified organism: J18.9

## 2014-06-16 HISTORY — DX: Type 2 diabetes mellitus without complications: E11.9

## 2014-06-16 LAB — CBC WITH DIFFERENTIAL/PLATELET
Basophils Absolute: 0 10*3/uL (ref 0.0–0.1)
Basophils Absolute: 0 10*3/uL (ref 0.0–0.1)
Basophils Relative: 0 % (ref 0–1)
Basophils Relative: 0 % (ref 0–1)
Eosinophils Absolute: 0.2 10*3/uL (ref 0.0–0.7)
Eosinophils Absolute: 0.2 10*3/uL (ref 0.0–0.7)
Eosinophils Relative: 2 % (ref 0–5)
Eosinophils Relative: 3 % (ref 0–5)
HCT: 31.9 % — ABNORMAL LOW (ref 39.0–52.0)
HCT: 31.9 % — ABNORMAL LOW (ref 39.0–52.0)
Hemoglobin: 10.8 g/dL — ABNORMAL LOW (ref 13.0–17.0)
Hemoglobin: 10.5 g/dL — ABNORMAL LOW (ref 13.0–17.0)
Lymphocytes Relative: 27 % (ref 12–46)
Lymphocytes Relative: 31 % (ref 12–46)
Lymphs Abs: 2.7 10*3/uL (ref 0.7–4.0)
Lymphs Abs: 2.2 10*3/uL (ref 0.7–4.0)
MCH: 28.1 pg (ref 26.0–34.0)
MCH: 27.5 pg (ref 26.0–34.0)
MCHC: 33.9 g/dL (ref 30.0–36.0)
MCHC: 32.9 g/dL (ref 30.0–36.0)
MCV: 83.1 fL (ref 78.0–100.0)
MCV: 83.5 fL (ref 78.0–100.0)
Monocytes Absolute: 0.8 10*3/uL (ref 0.1–1.0)
Monocytes Absolute: 0.5 10*3/uL (ref 0.1–1.0)
Monocytes Relative: 8 % (ref 3–12)
Monocytes Relative: 8 % (ref 3–12)
Neutro Abs: 6.4 10*3/uL (ref 1.7–7.7)
Neutro Abs: 4.2 10*3/uL (ref 1.7–7.7)
Neutrophils Relative %: 63 % (ref 43–77)
Neutrophils Relative %: 58 % (ref 43–77)
Platelets: 279 10*3/uL (ref 150–400)
Platelets: 241 10*3/uL (ref 150–400)
RBC: 3.84 MIL/uL — ABNORMAL LOW (ref 4.22–5.81)
RBC: 3.82 MIL/uL — ABNORMAL LOW (ref 4.22–5.81)
RDW: 15.5 % (ref 11.5–15.5)
RDW: 15.5 % (ref 11.5–15.5)
WBC: 10.1 10*3/uL (ref 4.0–10.5)
WBC: 7.1 10*3/uL (ref 4.0–10.5)

## 2014-06-16 LAB — BRAIN NATRIURETIC PEPTIDE: B Natriuretic Peptide: 432.2 pg/mL — ABNORMAL HIGH (ref 0.0–100.0)

## 2014-06-16 LAB — COMPREHENSIVE METABOLIC PANEL
ALT: 45 U/L (ref 0–53)
ALT: 44 U/L (ref 0–53)
AST: 32 U/L (ref 0–37)
AST: 31 U/L (ref 0–37)
Albumin: 3.5 g/dL (ref 3.5–5.2)
Albumin: 3.2 g/dL — ABNORMAL LOW (ref 3.5–5.2)
Alkaline Phosphatase: 106 U/L (ref 39–117)
Alkaline Phosphatase: 104 U/L (ref 39–117)
Anion gap: 15 (ref 5–15)
Anion gap: 13 (ref 5–15)
BUN: 70 mg/dL — ABNORMAL HIGH (ref 6–23)
BUN: 71 mg/dL — ABNORMAL HIGH (ref 6–23)
CO2: 16 mmol/L — ABNORMAL LOW (ref 19–32)
CO2: 18 mmol/L — ABNORMAL LOW (ref 19–32)
Calcium: 6.9 mg/dL — ABNORMAL LOW (ref 8.4–10.5)
Calcium: 6.9 mg/dL — ABNORMAL LOW (ref 8.4–10.5)
Chloride: 107 mmol/L (ref 96–112)
Chloride: 109 mmol/L (ref 96–112)
Creatinine, Ser: 11.18 mg/dL — ABNORMAL HIGH (ref 0.50–1.35)
Creatinine, Ser: 11.35 mg/dL — ABNORMAL HIGH (ref 0.50–1.35)
GFR calc Af Amer: 5 mL/min — ABNORMAL LOW (ref >90)
GFR calc Af Amer: 5 mL/min — ABNORMAL LOW (ref >90)
GFR calc non Af Amer: 4 mL/min — ABNORMAL LOW (ref >90)
GFR calc non Af Amer: 4 mL/min — ABNORMAL LOW (ref >90)
Glucose, Bld: 113 mg/dL — ABNORMAL HIGH (ref 70–99)
Glucose, Bld: 101 mg/dL — ABNORMAL HIGH (ref 70–99)
Potassium: 4.2 mmol/L (ref 3.5–5.1)
Potassium: 3.9 mmol/L (ref 3.5–5.1)
Sodium: 138 mmol/L (ref 135–145)
Sodium: 140 mmol/L (ref 135–145)
Total Bilirubin: 0.3 mg/dL (ref 0.3–1.2)
Total Bilirubin: 0.5 mg/dL (ref 0.3–1.2)
Total Protein: 7.2 g/dL (ref 6.0–8.3)
Total Protein: 6.6 g/dL (ref 6.0–8.3)

## 2014-06-16 LAB — IRON AND TIBC
Iron: 58 ug/dL (ref 42–165)
Saturation Ratios: 26 % (ref 20–55)
TIBC: 219 ug/dL (ref 215–435)
UIBC: 161 ug/dL (ref 125–400)

## 2014-06-16 LAB — CBG MONITORING, ED
Glucose-Capillary: 100 mg/dL — ABNORMAL HIGH (ref 70–99)
Glucose-Capillary: 101 mg/dL — ABNORMAL HIGH (ref 70–99)
Glucose-Capillary: 109 mg/dL — ABNORMAL HIGH (ref 70–99)

## 2014-06-16 LAB — GLUCOSE, CAPILLARY
Glucose-Capillary: 111 mg/dL — ABNORMAL HIGH (ref 70–99)
Glucose-Capillary: 134 mg/dL — ABNORMAL HIGH (ref 70–99)

## 2014-06-16 LAB — I-STAT TROPONIN, ED: Troponin i, poc: 0.02 ng/mL (ref 0.00–0.08)

## 2014-06-16 LAB — TROPONIN I
Troponin I: 0.04 ng/mL — ABNORMAL HIGH (ref <0.031)
Troponin I: 0.03 ng/mL (ref <0.031)

## 2014-06-16 LAB — FERRITIN: Ferritin: 172 ng/mL (ref 22–322)

## 2014-06-16 LAB — PHOSPHORUS: Phosphorus: 7.1 mg/dL — ABNORMAL HIGH (ref 2.3–4.6)

## 2014-06-16 MED ORDER — ONDANSETRON HCL 4 MG/2ML IJ SOLN: 4.0000 mg | Freq: Four times a day (QID) | INTRAMUSCULAR | Status: DC | PRN

## 2014-06-16 MED ORDER — ASPIRIN EC 81 MG PO TBEC
81.0000 mg | DELAYED_RELEASE_TABLET | Freq: Every day | ORAL | Status: DC
  Administered 2014-06-16 – 2014-06-23 (×8): 81 mg via ORAL
  Filled 2014-06-16 (×8): qty 1

## 2014-06-16 MED ORDER — PANTOPRAZOLE SODIUM 40 MG PO TBEC
40.0000 mg | DELAYED_RELEASE_TABLET | Freq: Every day | ORAL | Status: DC
  Administered 2014-06-16 – 2014-06-23 (×8): 40 mg via ORAL
  Filled 2014-06-16 (×6): qty 1

## 2014-06-16 MED ORDER — HYDROCODONE-ACETAMINOPHEN 5-325 MG PO TABS
1.0000 | ORAL_TABLET | Freq: Four times a day (QID) | ORAL | Status: DC | PRN
  Administered 2014-06-16 – 2014-06-20 (×4): 1 via ORAL
  Filled 2014-06-16 (×4): qty 1

## 2014-06-16 MED ORDER — CLONIDINE HCL 0.3 MG PO TABS
0.3000 mg | ORAL_TABLET | Freq: Two times a day (BID) | ORAL | Status: DC
  Administered 2014-06-16 – 2014-06-20 (×8): 0.3 mg via ORAL
  Filled 2014-06-16 (×13): qty 1

## 2014-06-16 MED ORDER — SODIUM CHLORIDE 0.9 % IJ SOLN
3.0000 mL | Freq: Two times a day (BID) | INTRAMUSCULAR | Status: DC
  Administered 2014-06-16 – 2014-06-22 (×13): 3 mL via INTRAVENOUS

## 2014-06-16 MED ORDER — CARVEDILOL 25 MG PO TABS
25.0000 mg | ORAL_TABLET | Freq: Two times a day (BID) | ORAL | Status: DC
  Administered 2014-06-16 – 2014-06-21 (×10): 25 mg via ORAL
  Filled 2014-06-16 (×17): qty 1

## 2014-06-16 MED ORDER — CYCLOBENZAPRINE HCL 10 MG PO TABS
10.0000 mg | ORAL_TABLET | Freq: Every day | ORAL | Status: DC
  Administered 2014-06-16 – 2014-06-22 (×7): 10 mg via ORAL
  Filled 2014-06-16 (×9): qty 1

## 2014-06-16 MED ORDER — HEPARIN SODIUM (PORCINE) 5000 UNIT/ML IJ SOLN
5000.0000 [IU] | Freq: Three times a day (TID) | INTRAMUSCULAR | Status: DC
  Administered 2014-06-16 – 2014-06-22 (×19): 5000 [IU] via SUBCUTANEOUS
  Filled 2014-06-16 (×23): qty 1

## 2014-06-16 MED ORDER — ATORVASTATIN CALCIUM 40 MG PO TABS
40.0000 mg | ORAL_TABLET | Freq: Every day | ORAL | Status: DC
  Administered 2014-06-16 – 2014-06-22 (×7): 40 mg via ORAL
  Filled 2014-06-16 (×9): qty 1

## 2014-06-16 MED ORDER — DEXTROSE 5 % IV SOLN
120.0000 mg | Freq: Three times a day (TID) | INTRAVENOUS | Status: DC
  Administered 2014-06-16 – 2014-06-20 (×10): 120 mg via INTRAVENOUS
  Filled 2014-06-16 (×17): qty 12

## 2014-06-16 MED ORDER — NITROGLYCERIN 0.4 MG SL SUBL: 0.4000 mg | SUBLINGUAL_TABLET | SUBLINGUAL | Status: DC | PRN

## 2014-06-16 MED ORDER — ASPIRIN 81 MG PO CHEW
324.0000 mg | CHEWABLE_TABLET | Freq: Once | ORAL | Status: AC
  Administered 2014-06-16: 324 mg via ORAL
  Filled 2014-06-16: qty 4

## 2014-06-16 MED ORDER — FUROSEMIDE 10 MG/ML IJ SOLN
80.0000 mg | Freq: Once | INTRAMUSCULAR | Status: AC
  Administered 2014-06-16: 80 mg via INTRAVENOUS
  Filled 2014-06-16: qty 8

## 2014-06-16 MED ORDER — AMLODIPINE BESYLATE 10 MG PO TABS
10.0000 mg | ORAL_TABLET | Freq: Every day | ORAL | Status: DC
  Administered 2014-06-16 – 2014-06-21 (×5): 10 mg via ORAL
  Filled 2014-06-16 (×4): qty 1
  Filled 2014-06-16: qty 2
  Filled 2014-06-16: qty 1

## 2014-06-16 MED ORDER — NITROGLYCERIN 2 % TD OINT
1.0000 [in_us] | TOPICAL_OINTMENT | Freq: Once | TRANSDERMAL | Status: AC
  Administered 2014-06-16: 1 [in_us] via TOPICAL
  Filled 2014-06-16: qty 1

## 2014-06-16 MED ORDER — MORPHINE SULFATE 2 MG/ML IJ SOLN: 1.0000 mg | INTRAMUSCULAR | Status: DC | PRN

## 2014-06-16 MED ORDER — ACETAMINOPHEN 650 MG RE SUPP: 650.0000 mg | Freq: Four times a day (QID) | RECTAL | Status: DC | PRN

## 2014-06-16 MED ORDER — INSULIN ASPART 100 UNIT/ML ~~LOC~~ SOLN
0.0000 [IU] | Freq: Three times a day (TID) | SUBCUTANEOUS | Status: DC
  Administered 2014-06-19 – 2014-06-20 (×2): 2 [IU] via SUBCUTANEOUS
  Administered 2014-06-20 – 2014-06-21 (×3): 1 [IU] via SUBCUTANEOUS
  Administered 2014-06-21: 2 [IU] via SUBCUTANEOUS

## 2014-06-16 MED ORDER — ONDANSETRON HCL 4 MG PO TABS: 4.0000 mg | ORAL_TABLET | Freq: Four times a day (QID) | ORAL | Status: DC | PRN

## 2014-06-16 MED ORDER — ACETAMINOPHEN 325 MG PO TABS: 650.0000 mg | ORAL_TABLET | Freq: Four times a day (QID) | ORAL | Status: DC | PRN

## 2014-06-16 MED ORDER — CALCITRIOL 0.25 MCG PO CAPS
0.2500 ug | ORAL_CAPSULE | Freq: Every day | ORAL | Status: DC
  Administered 2014-06-16 – 2014-06-19 (×4): 0.25 ug via ORAL
  Filled 2014-06-16 (×5): qty 1

## 2014-06-16 MED ORDER — CLOPIDOGREL BISULFATE 75 MG PO TABS
75.0000 mg | ORAL_TABLET | Freq: Every day | ORAL | Status: DC
  Administered 2014-06-16 – 2014-06-23 (×8): 75 mg via ORAL
  Filled 2014-06-16 (×10): qty 1

## 2014-06-16 NOTE — ED Notes (Signed)
Pt transported to xray 

## 2014-06-16 NOTE — Progress Notes (Signed)
Patient eating and I got report from dayshift nurse of patient's on going chest pain. Currently has nitro paste on left chest.  Wife was in the room with him.  Will continue to monitor for any changes tonight.  EKG done by nurse tech --it is in the paper chart.

## 2014-06-16 NOTE — ED Notes (Signed)
Patient currently NPO, no diet tray ordered for breakfast.

## 2014-06-16 NOTE — ED Notes (Signed)
PT returned to room

## 2014-06-16 NOTE — ED Notes (Signed)
Patien presents with c/o CP and some SOB  States he was working and the pain will not go away.  Used 1 NTG without relief

## 2014-06-16 NOTE — H&P (Signed)
[facility] History and Physical  Jon Anderson [id number] DOB: 1951/01/03 DOA: 06/16/2014  Referring physician: ER physician. PCP: [name], MD   Chief Complaint: Chest pain.  HPI: Jon Anderson is a 64 y.o. male with history of chronic kidney disease, nonobstructive CAD, peripheral vascular disease, chronic anemia, diabetes mellitus type 2 comes to the ER with complaints of chest pain. Patient states he has been having persistent chest pain over the last 2 weeks which is mostly retrosternal present even at rest. Patient also has noticed increasing peripheral edema mostly in the lower extremities which has been worsening over the last 2-3 weeks. At times patient also get short of breath. In the ER cardiac markers were negative and on exam patient has significant lower extremity edema but patient states he still makes urine. And he has been taking his Lasix as advised. Patient's creatinine has worsened from 7-11 over the last 2 months. Chest x-ray shows edema. Patient has been admitted for further management of his chest pain and progressive renal failure. Patient denies any nausea vomiting abdominal pain diarrhea fever chills.  Review of Systems: As presented in the history of presenting illness, rest negative.  Past Medical History  Diagnosis Date  . Diabetes mellitus   . Hyperlipidemia   . HTN (hypertension)     echo 3/10: EF 60%, LAE  . CAD (coronary artery disease)     a. cath 3/10: oLAD 25%, mLAD 25%; CFX 25%; oDx 80% (small - tx. medically); pRCA 25%, mRCA 25%; OM3 20%;  b.  Myoview 4/11: EF 53%, no scar or ischemia   c. MV 2012 Nl perfusion, apical thinning.  No ischemia or scar.  EF 49%, appears greater by visual estimate.;  d.  Dob stress echo 12/13:  Negative Dob stress echo. There is no evidence of ischemia.  The LVF is normal.  . Chronic chest pain   . GERD (gastroesophageal reflux disease)   . Nephrolithiasis   . CKD (chronic kidney disease), stage IV      a. 05/2012 Renal u/s: medical renal dzs;  b. seen by [facility]  . Carotid stenosis     a. dopplers 05/2012:  [id number] RICA; [id number] LICA => repeat in 6 mos  . Snores     a. presumed OSA, pt has refused sleep eval in past.  . Peripheral arterial disease     high-grade distal left SFA by duplex ultrasound   Past Surgical History  Procedure Laterality Date  . Tonsillectomy    . Adenoidectomy    . Cardiac catheterization  2001 and 2010   . Angioplasty / stenting femoral Left 12/11/2013    dr berry  . Hernia repair    . Fracture surgery      right foot  . Colonoscopy w/ biopsies and polypectomy    . Lower extremity angiogram Left 12/11/2013    Procedure: LOWER EXTREMITY ANGIOGRAM;  Surgeon: Lorretta Harp, MD;  Location: [facility] CATH LAB;  Service: Cardiovascular;  Laterality: Left;  . Av fistula placement Left 03/19/2014    Procedure: CREATION OF ARTERIOVENOUS (AV) FISTULA  LEFT UPPER ARM;  Surgeon: Mal Misty, MD;  Location: [facility];  Service: Vascular;  Laterality: Left;   Social History:  reports that he has quit smoking. He has never used smokeless tobacco. He reports that he does not drink alcohol or use illicit drugs. Where does patient live home. Can patient participate in ADLs? Yes.  Allergies  Allergen Reactions  . Kiwi Extract Swelling  Facial swelling     Family History:  Family History  Problem Relation Age of Onset  . Heart attack Sister     died @ 8  . Cancer Mother     died @ 81  . Diabetes Brother     deceased      Prior to Admission medications   Medication Sig Start Date End Date Taking? Authorizing Provider  amLODipine (NORVASC) 10 MG tablet Take 10 mg by mouth daily. 01/15/14  Yes Historical Provider, MD  aspirin EC 81 MG tablet Take 1 tablet (81 mg total) by mouth daily. 05/28/12  Yes Liliane Shi, PA-C  atorvastatin (LIPITOR) 40 MG tablet Take 1 tablet (40 mg total) by mouth daily at 6 PM. 12/13/13  Yes Brett Canales, PA-C  calcitRIOL (ROCALTROL) 0.25  MCG capsule Take 0.25 mcg by mouth daily.   Yes Historical Provider, MD  carvedilol (COREG) 25 MG tablet Take 25 mg by mouth 2 (two) times daily with a meal.   Yes Historical Provider, MD  Cholecalciferol (VITAMIN D PO) Take 1 tablet by mouth daily.    Yes Historical Provider, MD  cloNIDine (CATAPRES) 0.3 MG tablet Take 1 tablet (0.3 mg total) by mouth 2 (two) times daily. 03/20/12  Yes Rhonda G Barrett, PA-C  clopidogrel (PLAVIX) 75 MG tablet Take 1 tablet (75 mg total) by mouth daily with breakfast. 12/13/13  Yes Einar Pheasant Hager, PA-C  cyclobenzaprine (FLEXERIL) 10 MG tablet Take 10 mg by mouth at bedtime.    Yes Historical Provider, MD  furosemide (LASIX) 40 MG tablet Take 1 tablet (40 mg total) by mouth daily. Patient taking differently: Take 60 mg by mouth daily.  03/25/14  Yes Jennifer Piepenbrink, PA-C  HYDROcodone-acetaminophen (NORCO) 5-325 MG per tablet Take 1 tablet by mouth every 6 (six) hours as needed for moderate pain. 03/19/14  Yes Samantha J Rhyne, PA-C  insulin aspart (NOVOLOG) 100 UNIT/ML injection Inject into the skin continuous. Per insulin pump 03/20/12  Yes Rhonda G Barrett, PA-C  nitroGLYCERIN (NITROSTAT) 0.4 MG SL tablet Place 1 tablet (0.4 mg total) under the tongue every 5 (five) minutes as needed for chest pain. 03/20/12  Yes Rhonda G Barrett, PA-C  pantoprazole (PROTONIX) 40 MG tablet Take 40 mg by mouth daily.   Yes Historical Provider, MD  isosorbide-hydrALAZINE (BIDIL) 20-37.5 MG per tablet Take 1 tablet by mouth 2 (two) times daily.    Historical Provider, MD    Physical Exam: Filed Vitals:   06/16/14 0500 06/16/14 0550 06/16/14 0600 06/16/14 0608  BP: 162/75 179/88 163/86 163/86  Pulse: 82 93 88 87  Temp:    98 F (36.7 C)  TempSrc:    Oral  Resp: 15 17 19 18   Height:      Weight:      SpO2: 96% 97% 93% 97%     General:  Moderately built and nourished.  Eyes: Anicteric no pallor.  ENT: No discharge from the ears eyes nose or mouth.  Neck: JVD  elevated.  Cardiovascular: S1-S2 heard.  Respiratory: No rhonchi or crepitations.  Abdomen: Soft nontender bowel sounds present.  Skin: No rash.  Musculoskeletal: Bilateral lower extremity edema.  Psychiatric: Appears normal.  Neurologic: Alert awake oriented to time place and person. Moves all extremities.  Labs on Admission:  Basic Metabolic Panel:  Recent Labs Lab 06/16/14 0033  NA 138  K 4.2  CL 107  CO2 16*  GLUCOSE 113*  BUN 70*  CREATININE 11.18*  CALCIUM 6.9*  Liver Function Tests:  Recent Labs Lab 06/16/14 0033  AST 32  ALT 45  ALKPHOS 106  BILITOT 0.3  PROT 7.2  ALBUMIN 3.5   No results for input(s): LIPASE, AMYLASE in the last 168 hours. No results for input(s): AMMONIA in the last 168 hours. CBC:  Recent Labs Lab 06/16/14 0033  WBC 10.1  NEUTROABS 6.4  HGB 10.8*  HCT 31.9*  MCV 83.1  PLT 279   Cardiac Enzymes: No results for input(s): CKTOTAL, CKMB, CKMBINDEX, TROPONINI in the last 168 hours.  BNP (last 3 results)  Recent Labs  06/16/14 0033  BNP 432.2*    ProBNP (last 3 results)  Recent Labs  03/25/14 0945  PROBNP 4548.0*    CBG:  Recent Labs Lab 06/16/14 0544  GLUCAP 100*    Radiological Exams on Admission: Dg Chest 2 View  06/16/2014   CLINICAL DATA:  LEFT-sided chest pain tonight. History of hypertension and diabetes.  EXAM: CHEST  2 VIEW  COMPARISON:  Chest radiograph June 12, 2014  FINDINGS: The cardiac silhouette appears mild to moderately enlarged, increased from prior imaging. Pulmonary vascular congestion, strandy densities in lung bases. Mild interstitial prominence. No pleural effusion. No pneumothorax. Soft tissue planes and included osseous structures are nonsuspicious. Patient is edentulous.  IMPRESSION: Increasing cardiomegaly, pulmonary edema confluent in the lung bases without pleural effusion.   Electronically Signed   By: Elon Alas   On: 06/16/2014 02:17    EKG: Independently reviewed.  Normal sinus rhythm with poor R-wave progression.  Assessment/Plan Principal Problem:   Acute on chronic renal failure Active Problems:   Hyperlipidemia LDL goal <70   Hypertension   Peripheral arterial disease   PVD (peripheral vascular disease)   Chest pain   Renal failure (ARF), acute on chronic   1. Acute on chronic renal failure with severe edema - I have discussed with on-call nephrologist Dr. Clover Mealy. Nephrology will be seeing patient in consult. I have placed patient on Lasix 120 mg IV every 8 hourly. Closely follow intake output and metabolic panel. Patient has fistula for dialysis. Patient probably may need dialysis. 2. Chest pain - patient has had a normal stress test in September 2015. Patient is on antiplatelet agents statins and beta blockers which will be continued. Cycle cardiac markers for now. 3. Hypertension - continue home medication. 4. Chronic anemia probably from his ESRD - follow CBC. 5. Diabetes mellitus type 2 on sliding scale coverage - follow CBGs closely. 6. Peripheral vascular disease on antiplatelet agents. 7. Hyperlipidemia continue statin.   DVT Prophylaxis heparin.  Code Status: Full code.  Family Communication: None.  Disposition Plan: Admit to inpatient.    Gena Laski N. [facility] Pager [phone].  If 7PM-7AM, please contact night-coverage [email or web address] Password [email or web address] 06/16/2014, 7:06 AM

## 2014-06-16 NOTE — ED Notes (Signed)
Patient states his fistule was placed in December and has not been used yet.

## 2014-06-16 NOTE — ED Provider Notes (Signed)
CSN: [id number]     Arrival date & time 06/16/14  0010 History  This chart was scribed for Julianne Rice, MD by Rayfield Citizen, ED Scribe. This patient was seen in room D34C/D34C and the patient's care was started at 1:22 AM.    Chief Complaint  Patient presents with  . Chest Pain  . Shortness of Breath   Patient is a 64 y.o. male presenting with chest pain and shortness of breath. The history is provided by the patient. No language interpreter was used.  Chest Pain Associated symptoms: shortness of breath   Associated symptoms: no abdominal pain, no back pain, no cough, no dizziness, no fever, no headache, no nausea, no numbness, no palpitations, not vomiting and no weakness   Shortness of Breath Associated symptoms: chest pain   Associated symptoms: no abdominal pain, no cough, no fever, no headaches, no neck pain, no rash and no vomiting      HPI Comments: Jon Anderson is a right-hand dominant 64 y.o. male with past medical history of DM, HLD, HTN, CAD, carotid stenosis, chronic chest pain, peripheral arterial disease, GERD, CKD stage IV who presents to the Emergency Department complaining of constant left-sided chest pain and SOB beginning around 22:00 tonight, rated at a 5/10. He explains that it is "somewhat painful" for him to take a deep breath. Nothing makes symptoms better or worse. He took an NTG without relief before coming to the ED tonight. This pain is similar to his previous experiences with chest pain; patient estimates he has chest pain 1-2x per week. He takes 81mg  aspirin daily (no dose today).  He denies cough.   He has had 3-4 weeks of BLE swelling; he is on furosemide.  Past Medical History  Diagnosis Date  . Diabetes mellitus   . Hyperlipidemia   . HTN (hypertension)     echo 3/10: EF 60%, LAE  . CAD (coronary artery disease)     a. cath 3/10: oLAD 25%, mLAD 25%; CFX 25%; oDx 80% (small - tx. medically); pRCA 25%, mRCA 25%; OM3 20%;  b.  Myoview 4/11: EF 53%,  no scar or ischemia   c. MV 2012 Nl perfusion, apical thinning.  No ischemia or scar.  EF 49%, appears greater by visual estimate.;  d.  Dob stress echo 12/13:  Negative Dob stress echo. There is no evidence of ischemia.  The LVF is normal.  . Chronic chest pain   . GERD (gastroesophageal reflux disease)   . Nephrolithiasis   . CKD (chronic kidney disease), stage IV     a. 05/2012 Renal u/s: medical renal dzs;  b. seen by [facility]  . Carotid stenosis     a. dopplers 05/2012:  [id number] RICA; [id number] LICA => repeat in 6 mos  . Snores     a. presumed OSA, pt has refused sleep eval in past.  . Peripheral arterial disease     high-grade distal left SFA by duplex ultrasound   Past Surgical History  Procedure Laterality Date  . Tonsillectomy    . Adenoidectomy    . Cardiac catheterization  2001 and 2010   . Angioplasty / stenting femoral Left 12/11/2013    dr berry  . Hernia repair    . Fracture surgery      right foot  . Colonoscopy w/ biopsies and polypectomy    . Lower extremity angiogram Left 12/11/2013    Procedure: LOWER EXTREMITY ANGIOGRAM;  Surgeon: Lorretta Harp, MD;  Location: [facility] CATH LAB;  Service: Cardiovascular;  Laterality: Left;  . Av fistula placement Left 03/19/2014    Procedure: CREATION OF ARTERIOVENOUS (AV) FISTULA  LEFT UPPER ARM;  Surgeon: Mal Misty, MD;  Location: [facility] OR;  Service: Vascular;  Laterality: Left;   Family History  Problem Relation Age of Onset  . Heart attack Sister     died @ 43  . Cancer Mother     died @ 36  . Diabetes Brother     deceased   History  Substance Use Topics  . Smoking status: Former Research scientist (life sciences)  . Smokeless tobacco: Never Used  . Alcohol Use: No    Review of Systems  Constitutional: Negative for fever and chills.  Respiratory: Positive for shortness of breath. Negative for cough.   Cardiovascular: Positive for chest pain and leg swelling. Negative for palpitations.  Gastrointestinal: Negative for nausea, vomiting,  abdominal pain and diarrhea.  Musculoskeletal: Negative for back pain, neck pain and neck stiffness.  Skin: Negative for rash and wound.  Neurological: Negative for dizziness, weakness, light-headedness, numbness and headaches.  All other systems reviewed and are negative.   Allergies  Kiwi extract  Home Medications   Prior to Admission medications   Medication Sig Start Date End Date Taking? Authorizing Provider  amLODipine (NORVASC) 10 MG tablet Take 10 mg by mouth daily. 01/15/14  Yes Historical Provider, MD  aspirin EC 81 MG tablet Take 1 tablet (81 mg total) by mouth daily. 05/28/12  Yes Liliane Shi, PA-C  atorvastatin (LIPITOR) 40 MG tablet Take 1 tablet (40 mg total) by mouth daily at 6 PM. 12/13/13  Yes Brett Canales, PA-C  calcitRIOL (ROCALTROL) 0.25 MCG capsule Take 0.25 mcg by mouth daily.   Yes Historical Provider, MD  carvedilol (COREG) 25 MG tablet Take 25 mg by mouth 2 (two) times daily with a meal.   Yes Historical Provider, MD  Cholecalciferol (VITAMIN D PO) Take 1 tablet by mouth daily.    Yes Historical Provider, MD  cloNIDine (CATAPRES) 0.3 MG tablet Take 1 tablet (0.3 mg total) by mouth 2 (two) times daily. 03/20/12  Yes Rhonda G Barrett, PA-C  clopidogrel (PLAVIX) 75 MG tablet Take 1 tablet (75 mg total) by mouth daily with breakfast. 12/13/13  Yes Einar Pheasant Hager, PA-C  cyclobenzaprine (FLEXERIL) 10 MG tablet Take 10 mg by mouth at bedtime.    Yes Historical Provider, MD  furosemide (LASIX) 40 MG tablet Take 1 tablet (40 mg total) by mouth daily. Patient taking differently: Take 60 mg by mouth daily.  03/25/14  Yes Jennifer Piepenbrink, PA-C  HYDROcodone-acetaminophen (NORCO) 5-325 MG per tablet Take 1 tablet by mouth every 6 (six) hours as needed for moderate pain. 03/19/14  Yes Samantha J Rhyne, PA-C  insulin aspart (NOVOLOG) 100 UNIT/ML injection Inject into the skin continuous. Per insulin pump 03/20/12  Yes Rhonda G Barrett, PA-C  nitroGLYCERIN (NITROSTAT) 0.4 MG SL  tablet Place 1 tablet (0.4 mg total) under the tongue every 5 (five) minutes as needed for chest pain. 03/20/12  Yes Rhonda G Barrett, PA-C  pantoprazole (PROTONIX) 40 MG tablet Take 40 mg by mouth daily.   Yes Historical Provider, MD  isosorbide-hydrALAZINE (BIDIL) 20-37.5 MG per tablet Take 1 tablet by mouth 2 (two) times daily.    Historical Provider, MD   BP 165/80 mmHg  Pulse 90  Resp 21  Ht 5\' 11"  (1.803 m)  Wt 185 lb (83.915 kg)  BMI 25.81 kg/m2  SpO2 95% Physical Exam  Constitutional: He is  oriented to person, place, and time. He appears well-developed and well-nourished. No distress.  HENT:  Head: Normocephalic and atraumatic.  Mouth/Throat: Oropharynx is clear and moist.  Eyes: EOM are normal. Pupils are equal, round, and reactive to light.  Neck: Normal range of motion. Neck supple.  Cardiovascular: Normal rate and regular rhythm.  Exam reveals no gallop and no friction rub.   No murmur heard. Pulmonary/Chest: Effort normal and breath sounds normal. No respiratory distress. He has no wheezes. He has no rales. He exhibits no tenderness.  Abdominal: Soft. Bowel sounds are normal. He exhibits no distension and no mass. There is no tenderness. There is no rebound and no guarding.  Musculoskeletal: Normal range of motion. He exhibits edema. He exhibits no tenderness.  Showed bilateral lower extremity swelling. 3+. Left lower extremity slightly more warm than the right. Dorsalis pedis pulses bilaterally intact  Neurological: He is alert and oriented to person, place, and time.  Skin: Skin is warm and dry. No rash noted. No erythema.  Psychiatric: He has a normal mood and affect. His behavior is normal.  Nursing note and vitals reviewed.   ED Course  Procedures   DIAGNOSTIC STUDIES: Oxygen Saturation is 96% on RA, adequate by my interpretation.    COORDINATION OF CARE: 1:29 AM Discussed treatment plan with pt at bedside and pt agreed to plan.   Labs Review Labs Reviewed   CBC WITH DIFFERENTIAL/PLATELET - Abnormal; Notable for the following:    RBC 3.84 (*)    Hemoglobin 10.8 (*)    HCT 31.9 (*)    All other components within normal limits  COMPREHENSIVE METABOLIC PANEL - Abnormal; Notable for the following:    CO2 16 (*)    Glucose, Bld 113 (*)    BUN 70 (*)    Creatinine, Ser 11.18 (*)    Calcium 6.9 (*)    GFR calc non Af Amer 4 (*)    GFR calc Af Amer 5 (*)    All other components within normal limits  BRAIN NATRIURETIC PEPTIDE - Abnormal; Notable for the following:    B Natriuretic Peptide 432.2 (*)    All other components within normal limits  I-STAT TROPOININ, ED    Imaging Review Dg Chest 2 View  06/16/2014   CLINICAL DATA:  LEFT-sided chest pain tonight. History of hypertension and diabetes.  EXAM: CHEST  2 VIEW  COMPARISON:  Chest radiograph June 12, 2014  FINDINGS: The cardiac silhouette appears mild to moderately enlarged, increased from prior imaging. Pulmonary vascular congestion, strandy densities in lung bases. Mild interstitial prominence. No pleural effusion. No pneumothorax. Soft tissue planes and included osseous structures are nonsuspicious. Patient is edentulous.  IMPRESSION: Increasing cardiomegaly, pulmonary edema confluent in the lung bases without pleural effusion.   Electronically Signed   By: Elon Alas   On: 06/16/2014 02:17     EKG Interpretation   Date/Time:  Tuesday June 16 2014 00:13:58 EST Ventricular Rate:  96 PR Interval:  128 QRS Duration: 92 QT Interval:  356 QTC Calculation: 449 R Axis:   47 Text Interpretation:  Normal sinus rhythm Possible Anterior infarct , age  undetermined Abnormal ECG Confirmed by Lita Mains  MD, Story Vanvranken ([id number]) on  06/16/2014 2:05:44 AM      MDM   Final diagnoses:  Chest pain  Renal failure (ARF), acute on chronic    I personally performed the services described in this documentation, which was scribed in my presence. The recorded information has been reviewed and is  accurate.   Chest pain improved after nitroglycerin. Patient with now acute on chronic renal failure. Evidence of pulmonary edema on chest x-ray. Discussed with Dr.Krakakandy. Will admit to step down bed.    Julianne Rice, MD 06/16/14 [phone]

## 2014-06-16 NOTE — Progress Notes (Signed)
64 YEAR old gentleman with h/o ESRD, not on HD , DM, came in for substernal chest pain at rest and resolved with nitro paste and was admitted earlier this am by Dr Hal Hope, started having recurrent chest pain after they transported the patient upstairs.  He reports it is left sided associated with some dizziness. Initial troponin this am was minimally elevated and the repeat one was negative.  Getting an EKG and a stat troponin. Requested cardiology consultation for further recommendations. Patient is already on aspirin, plavix and coreg.  He had a negative stress eval in 12/2013.   Renal on board for ESRD.  Continue to monitor.   Hosie Poisson, MD [phone]

## 2014-06-16 NOTE — ED Notes (Signed)
Notified RN of CBG 101 

## 2014-06-16 NOTE — Consult Note (Signed)
Primary cardiologist: Dr [name] Consulting cardiologist: Dr Carlyle Dolly  Clinical Summary Jon Anderson is a 64 y.o.male hx of severe CKD not yet on HD, PAD with prior interventions, non-obstructive CAD by cath 20112, DM2, HTN and prior history of chest pain admitted with chest pain. From notes multiple prior presentations with chest pain with workup including negative VQ scan 03/2014 and also negative Lexisccan MPI 12/2013.   Reports recent chest pain episodes similar to symptoms he has been having over the last year. 6/10 left sided pressure with some associated SOB, no other associated symptoms. Can occur at rest or with exertion. Lasts approx 10-20 minutes. Is not positional. Not related to food. Not better with home NG. Feels like increase in frequency over the last few weeks, now occuring 2-3 times a day. No change in severity.    12/2013 Lexiscan MPI: no ischemia, LVEF 55% 03/2014 VQ scan: no PE 06/2008 cath: Mild non-obstructive disease BNP 432, CO2 16, Cr 11, Hgb 10.8, Plt 279, trop neg then 0.04 then 0.03. CXR non-specific RLL opacity EKG SR, no ischemic changes   Allergies  Allergen Reactions  . Kiwi Extract Swelling    Facial swelling     Medications Scheduled Medications: . amLODipine  10 mg Oral Daily  . aspirin EC  81 mg Oral Daily  . atorvastatin  40 mg Oral q1800  . calcitRIOL  0.25 mcg Oral Daily  . carvedilol  25 mg Oral BID WC  . cloNIDine  0.3 mg Oral BID  . clopidogrel  75 mg Oral Q breakfast  . cyclobenzaprine  10 mg Oral QHS  . furosemide  120 mg Intravenous Q8H  . heparin  5,000 Units Subcutaneous 3 times per day  . insulin aspart  0-9 Units Subcutaneous TID WC  . pantoprazole  40 mg Oral Daily  . sodium chloride  3 mL Intravenous Q12H     Infusions:     PRN Medications:  acetaminophen **OR** acetaminophen, HYDROcodone-acetaminophen, morphine injection, nitroGLYCERIN, ondansetron **OR** ondansetron (ZOFRAN) IV   Past Medical  History  Diagnosis Date  . Diabetes mellitus   . Hyperlipidemia   . HTN (hypertension)     echo 3/10: EF 60%, LAE  . CAD (coronary artery disease)     a. cath 3/10: oLAD 25%, mLAD 25%; CFX 25%; oDx 80% (small - tx. medically); pRCA 25%, mRCA 25%; OM3 20%;  b.  Myoview 4/11: EF 53%, no scar or ischemia   c. MV 2012 Nl perfusion, apical thinning.  No ischemia or scar.  EF 49%, appears greater by visual estimate.;  d.  Dob stress echo 12/13:  Negative Dob stress echo. There is no evidence of ischemia.  The LVF is normal.  . Chronic chest pain   . GERD (gastroesophageal reflux disease)   . Nephrolithiasis   . CKD (chronic kidney disease), stage IV     a. 05/2012 Renal u/s: medical renal dzs;  b. seen by [facility]  . Carotid stenosis     a. dopplers 05/2012:  [id number] RICA; [id number] LICA => repeat in 6 mos  . Snores     a. presumed OSA, pt has refused sleep eval in past.  . Peripheral arterial disease     high-grade distal left SFA by duplex ultrasound    Past Surgical History  Procedure Laterality Date  . Tonsillectomy    . Adenoidectomy    . Cardiac catheterization  2001 and 2010   . Angioplasty / stenting femoral Left 12/11/2013  dr berry  . Hernia repair    . Fracture surgery      right foot  . Colonoscopy w/ biopsies and polypectomy    . Lower extremity angiogram Left 12/11/2013    Procedure: LOWER EXTREMITY ANGIOGRAM;  Surgeon: Lorretta Harp, MD;  Location: [facility] CATH LAB;  Service: Cardiovascular;  Laterality: Left;  . Av fistula placement Left 03/19/2014    Procedure: CREATION OF ARTERIOVENOUS (AV) FISTULA  LEFT UPPER ARM;  Surgeon: Mal Misty, MD;  Location: [facility] OR;  Service: Vascular;  Laterality: Left;    Family History  Problem Relation Age of Onset  . Heart attack Sister     died @ 52  . Cancer Mother     died @ 61  . Diabetes Brother     deceased    Social History Jon Anderson reports that he has quit smoking. He has never used smokeless tobacco. Mr.  Anderson reports that he does not drink alcohol.  Review of Systems CONSTITUTIONAL: No weight loss, fever, chills, weakness or fatigue.  HEENT: Eyes: No visual loss, blurred vision, double vision or yellow sclerae. No hearing loss, sneezing, congestion, runny nose or sore throat.  SKIN: No rash or itching.  CARDIOVASCULAR: per HPI  RESPIRATORY: No shortness of breath, cough or sputum.  GASTROINTESTINAL: No anorexia, nausea, vomiting or diarrhea. No abdominal pain or blood.  GENITOURINARY: no polyuria, no dysuria NEUROLOGICAL: No headache, dizziness, syncope, paralysis, ataxia, numbness or tingling in the extremities. No change in bowel or bladder control.  MUSCULOSKELETAL: No muscle, back pain, joint pain or stiffness.  HEMATOLOGIC: No anemia, bleeding or bruising.  LYMPHATICS: No enlarged nodes. No history of splenectomy.  PSYCHIATRIC: No history of depression or anxiety.      Physical Examination Blood pressure 161/77, pulse 77, temperature 97.9 F (36.6 C), temperature source Oral, resp. rate 18, height 5\' 11"  (1.803 m), weight 187 lb 11.2 oz (85.14 kg), SpO2 100 %.  Intake/Output Summary (Last 24 hours) at 06/16/14 1836 Last data filed at 06/16/14 1800  Gross per 24 hour  Intake    302 ml  Output    725 ml  Net   -423 ml    HEENT: sclera clear  Cardiovascular: RRR, no m/r/g, no JVD. Bilateral carotid bruits  Respiratory: CTAB  GI: abdomen soft, NT, ND  MSK: 1+ bilateral edema  Neuro: no focal deficits  Psych: appropriate affect   Lab Results  Basic Metabolic Panel:  Recent Labs Lab 06/16/14 0033 06/16/14 0745 06/16/14 1334  NA 138 140  --   K 4.2 3.9  --   CL 107 109  --   CO2 16* 18*  --   GLUCOSE 113* 101*  --   BUN 70* 71*  --   CREATININE 11.18* 11.35*  --   CALCIUM 6.9* 6.9*  --   PHOS  --   --  7.1*    Liver Function Tests:  Recent Labs Lab 06/16/14 0033 06/16/14 0745  AST 32 31  ALT 45 44  ALKPHOS 106 104  BILITOT 0.3 0.5  PROT  7.2 6.6  ALBUMIN 3.5 3.2*    CBC:  Recent Labs Lab 06/16/14 0033 06/16/14 0745  WBC 10.1 7.1  NEUTROABS 6.4 4.2  HGB 10.8* 10.5*  HCT 31.9* 31.9*  MCV 83.1 83.5  PLT 279 241    Cardiac Enzymes:  Recent Labs Lab 06/16/14 0745 06/16/14 1334  TROPONINI 0.04* 0.03    BNP: Invalid input(s): POCBNP     Impression/Recommendations 1.  Chest pain - symptoms ongoing for over a year. Previous negative workup including negative 12/2013 Lexiscan MPI and negative VQ scan 03/2014. Symptoms overall somewhat mixed for cardiac chest pain.  - no current evidence of ACS, continue to cycle cardiac enzymes. Will order echo as no recent study done. Do not see strong indication for repeat non-invasive testing at this time, continue medical therapy.  -  With Cr invasive testing not an option. If he becomes committed to HD in the near future could consider definitive coronary testing at some point. He has fistula already in place.  2. Metabolic acidosis - per primary team  3. Acute on chronic renal failure - management per primary team and renal,   Carlyle Dolly, M.D.

## 2014-06-16 NOTE — Consult Note (Addendum)
Reason for Consult: Chronic kidney disease stage V-progressive now with volume overload/chest pain Referring Physician: Gean Birchwood MD [facility])  HPI:  64 year old African-American man with past medical history significant for chronic kidney disease stage IV/V that appears to be progressive from his underlying hypertension and diabetes. He also has a history of peripheral vascular disease and nonobstructive coronary artery disease. Presented to the emergency room with progressive worsening of substernal chest pain (even at rest). He reports occasional exertional dyspnea but most prominently has had worsening of lower extremity edema-left leg greater than right. Denies any fevers or chills and has intermittent cough that is dry. Denies any obstructive or irritative urinary symptoms.  Denies any diaphoresis with this chest pain and has no radiation of his pain down his arms.  Reports good compliance with his furosemide/no changes in his diet. Denies any nausea, vomiting or diarrhea but has dysgeusia.  Past Medical History  Diagnosis Date  . Diabetes mellitus   . Hyperlipidemia   . HTN (hypertension)     echo 3/10: EF 60%, LAE  . CAD (coronary artery disease)     a. cath 3/10: oLAD 25%, mLAD 25%; CFX 25%; oDx 80% (small - tx. medically); pRCA 25%, mRCA 25%; OM3 20%;  b.  Myoview 4/11: EF 53%, no scar or ischemia   c. MV 2012 Nl perfusion, apical thinning.  No ischemia or scar.  EF 49%, appears greater by visual estimate.;  d.  Dob stress echo 12/13:  Negative Dob stress echo. There is no evidence of ischemia.  The LVF is normal.  . Chronic chest pain   . GERD (gastroesophageal reflux disease)   . Nephrolithiasis   . CKD (chronic kidney disease), stage IV     a. 05/2012 Renal u/s: medical renal dzs;  b. seen by [facility]  . Carotid stenosis     a. dopplers 05/2012:  2-95% RICA; 18-84% LICA => repeat in 6 mos  . Snores     a. presumed OSA, pt has refused sleep eval in past.  . Peripheral  arterial disease     high-grade distal left SFA by duplex ultrasound    Past Surgical History  Procedure Laterality Date  . Tonsillectomy    . Adenoidectomy    . Cardiac catheterization  2001 and 2010   . Angioplasty / stenting femoral Left 12/11/2013    dr berry  . Hernia repair    . Fracture surgery      right foot  . Colonoscopy w/ biopsies and polypectomy    . Lower extremity angiogram Left 12/11/2013    Procedure: LOWER EXTREMITY ANGIOGRAM;  Surgeon: Lorretta Harp, MD;  Location: [facility] CATH LAB;  Service: Cardiovascular;  Laterality: Left;  . Av fistula placement Left 03/19/2014    Procedure: CREATION OF ARTERIOVENOUS (AV) FISTULA  LEFT UPPER ARM;  Surgeon: Mal Misty, MD;  Location: [facility] OR;  Service: Vascular;  Laterality: Left;    Family History  Problem Relation Age of Onset  . Heart attack Sister     died @ 85  . Cancer Mother     died @ 16  . Diabetes Brother     deceased    Social History:  reports that he has quit smoking. He has never used smokeless tobacco. He reports that he does not drink alcohol or use illicit drugs.  Allergies:  Allergies  Allergen Reactions  . Kiwi Extract Swelling    Facial swelling     Medications:  Scheduled: . amLODipine  10 mg Oral Daily  . aspirin EC  81 mg Oral Daily  . atorvastatin  40 mg Oral q1800  . calcitRIOL  0.25 mcg Oral Daily  . carvedilol  25 mg Oral BID WC  . cloNIDine  0.3 mg Oral BID  . clopidogrel  75 mg Oral Q breakfast  . cyclobenzaprine  10 mg Oral QHS  . heparin  5,000 Units Subcutaneous 3 times per day  . insulin aspart  0-9 Units Subcutaneous TID WC  . pantoprazole  40 mg Oral Daily  . sodium chloride  3 mL Intravenous Q12H    Results for orders placed or performed during the hospital encounter of 06/16/14 (from the past 48 hour(s))  CBC with Differential     Status: Abnormal   Collection Time: 06/16/14 12:33 AM  Result Value Ref Range   WBC 10.1 4.0 - 10.5 K/uL   RBC 3.84 (L) 4.22 - 5.81  MIL/uL   Hemoglobin 10.8 (L) 13.0 - 17.0 g/dL   HCT 31.9 (L) 39.0 - 52.0 %   MCV 83.1 78.0 - 100.0 fL   MCH 28.1 26.0 - 34.0 pg   MCHC 33.9 30.0 - 36.0 g/dL   RDW 15.5 11.5 - 15.5 %   Platelets 279 150 - 400 K/uL   Neutrophils Relative % 63 43 - 77 %   Neutro Abs 6.4 1.7 - 7.7 K/uL   Lymphocytes Relative 27 12 - 46 %   Lymphs Abs 2.7 0.7 - 4.0 K/uL   Monocytes Relative 8 3 - 12 %   Monocytes Absolute 0.8 0.1 - 1.0 K/uL   Eosinophils Relative 2 0 - 5 %   Eosinophils Absolute 0.2 0.0 - 0.7 K/uL   Basophils Relative 0 0 - 1 %   Basophils Absolute 0.0 0.0 - 0.1 K/uL  Comprehensive metabolic panel     Status: Abnormal   Collection Time: 06/16/14 12:33 AM  Result Value Ref Range   Sodium 138 135 - 145 mmol/L   Potassium 4.2 3.5 - 5.1 mmol/L   Chloride 107 96 - 112 mmol/L   CO2 16 (L) 19 - 32 mmol/L   Glucose, Bld 113 (H) 70 - 99 mg/dL   BUN 70 (H) 6 - 23 mg/dL   Creatinine, Ser 11.18 (H) 0.50 - 1.35 mg/dL   Calcium 6.9 (L) 8.4 - 10.5 mg/dL   Total Protein 7.2 6.0 - 8.3 g/dL   Albumin 3.5 3.5 - 5.2 g/dL   AST 32 0 - 37 U/L   ALT 45 0 - 53 U/L   Alkaline Phosphatase 106 39 - 117 U/L   Total Bilirubin 0.3 0.3 - 1.2 mg/dL   GFR calc non Af Amer 4 (L) >90 mL/min   GFR calc Af Amer 5 (L) >90 mL/min    Comment: (NOTE) The eGFR has been calculated using the CKD EPI equation. This calculation has not been validated in all clinical situations. eGFR's persistently <90 mL/min signify possible Chronic Kidney Disease.    Anion gap 15 5 - 15  Brain natriuretic peptide     Status: Abnormal   Collection Time: 06/16/14 12:33 AM  Result Value Ref Range   B Natriuretic Peptide 432.2 (H) 0.0 - 100.0 pg/mL  I-Stat Troponin, ED (not at [facility])     Status: None   Collection Time: 06/16/14 12:47 AM  Result Value Ref Range   Troponin i, poc 0.02 0.00 - 0.08 ng/mL   Comment 3  Comment: Due to the release kinetics of cTnI, a negative result within the first hours of the onset of symptoms  does not rule out myocardial infarction with certainty. If myocardial infarction is still suspected, repeat the test at appropriate intervals.   CBG monitoring, ED     Status: Abnormal   Collection Time: 06/16/14  5:44 AM  Result Value Ref Range   Glucose-Capillary 100 (H) 70 - 99 mg/dL  CBG monitoring, ED     Status: Abnormal   Collection Time: 06/16/14  7:34 AM  Result Value Ref Range   Glucose-Capillary 101 (H) 70 - 99 mg/dL   Comment 1 Notify RN   Troponin I (q 6hr x 3)     Status: Abnormal   Collection Time: 06/16/14  7:45 AM  Result Value Ref Range   Troponin I 0.04 (H) <0.031 ng/mL    Comment:        PERSISTENTLY INCREASED TROPONIN VALUES IN THE RANGE OF 0.04-0.49 ng/mL CAN BE SEEN IN:       -UNSTABLE ANGINA       -CONGESTIVE HEART FAILURE       -MYOCARDITIS       -CHEST TRAUMA       -ARRYHTHMIAS       -LATE PRESENTING MYOCARDIAL INFARCTION       -COPD   CLINICAL FOLLOW-UP RECOMMENDED.   Comprehensive metabolic panel     Status: Abnormal   Collection Time: 06/16/14  7:45 AM  Result Value Ref Range   Sodium 140 135 - 145 mmol/L   Potassium 3.9 3.5 - 5.1 mmol/L   Chloride 109 96 - 112 mmol/L   CO2 18 (L) 19 - 32 mmol/L   Glucose, Bld 101 (H) 70 - 99 mg/dL   BUN 71 (H) 6 - 23 mg/dL   Creatinine, Ser 11.35 (H) 0.50 - 1.35 mg/dL   Calcium 6.9 (L) 8.4 - 10.5 mg/dL   Total Protein 6.6 6.0 - 8.3 g/dL   Albumin 3.2 (L) 3.5 - 5.2 g/dL   AST 31 0 - 37 U/L   ALT 44 0 - 53 U/L   Alkaline Phosphatase 104 39 - 117 U/L   Total Bilirubin 0.5 0.3 - 1.2 mg/dL   GFR calc non Af Amer 4 (L) >90 mL/min   GFR calc Af Amer 5 (L) >90 mL/min    Comment: (NOTE) The eGFR has been calculated using the CKD EPI equation. This calculation has not been validated in all clinical situations. eGFR's persistently <90 mL/min signify possible Chronic Kidney Disease.    Anion gap 13 5 - 15  CBC with Differential/Platelet     Status: Abnormal   Collection Time: 06/16/14  7:45 AM  Result Value  Ref Range   WBC 7.1 4.0 - 10.5 K/uL   RBC 3.82 (L) 4.22 - 5.81 MIL/uL   Hemoglobin 10.5 (L) 13.0 - 17.0 g/dL   HCT 31.9 (L) 39.0 - 52.0 %   MCV 83.5 78.0 - 100.0 fL   MCH 27.5 26.0 - 34.0 pg   MCHC 32.9 30.0 - 36.0 g/dL   RDW 15.5 11.5 - 15.5 %   Platelets 241 150 - 400 K/uL   Neutrophils Relative % 58 43 - 77 %   Neutro Abs 4.2 1.7 - 7.7 K/uL   Lymphocytes Relative 31 12 - 46 %   Lymphs Abs 2.2 0.7 - 4.0 K/uL   Monocytes Relative 8 3 - 12 %   Monocytes Absolute 0.5 0.1 - 1.0 K/uL  Eosinophils Relative 3 0 - 5 %   Eosinophils Absolute 0.2 0.0 - 0.7 K/uL   Basophils Relative 0 0 - 1 %   Basophils Absolute 0.0 0.0 - 0.1 K/uL    Dg Chest 2 View  06/16/2014   CLINICAL DATA:  LEFT-sided chest pain tonight. History of hypertension and diabetes.  EXAM: CHEST  2 VIEW  COMPARISON:  Chest radiograph June 12, 2014  FINDINGS: The cardiac silhouette appears mild to moderately enlarged, increased from prior imaging. Pulmonary vascular congestion, strandy densities in lung bases. Mild interstitial prominence. No pleural effusion. No pneumothorax. Soft tissue planes and included osseous structures are nonsuspicious. Patient is edentulous.  IMPRESSION: Increasing cardiomegaly, pulmonary edema confluent in the lung bases without pleural effusion.   Electronically Signed   By: Elon Alas   On: 06/16/2014 02:17    Review of Systems  Constitutional: Positive for malaise/fatigue. Negative for fever and chills.  HENT: Positive for congestion and sore throat.   Eyes: Negative.   Respiratory: Positive for cough, sputum production and shortness of breath. Negative for hemoptysis and wheezing.   Cardiovascular: Positive for chest pain, orthopnea and leg swelling. Negative for palpitations and claudication.  Gastrointestinal: Negative for nausea, vomiting, abdominal pain and diarrhea.       DYSGEUSIA  Genitourinary: Negative.   Musculoskeletal: Positive for myalgias. Negative for back pain, joint pain  and neck pain.       Leg cramps  Skin: Negative.   Neurological: Positive for weakness and headaches. Negative for dizziness and tingling.  Endo/Heme/Allergies: Negative.   Psychiatric/Behavioral: The patient is nervous/anxious.    Blood pressure 141/73, pulse 77, temperature 98 F (36.7 C), temperature source Oral, resp. rate 16, height '5\' 11"'  (1.803 m), weight 83.915 kg (185 lb), SpO2 98 %. Physical Exam  Nursing note and vitals reviewed. Constitutional: He is oriented to person, place, and time. He appears well-developed and well-nourished. No distress.  HENT:  Head: Normocephalic and atraumatic.  Mouth/Throat: Oropharynx is clear and moist.  Eyes: Conjunctivae and EOM are normal. Pupils are equal, round, and reactive to light. No scleral icterus.  Neck: Normal range of motion. Neck supple. JVD present. No thyromegaly present.  10cm JVD  Cardiovascular: Normal rate, regular rhythm and normal heart sounds.  Exam reveals no friction rub.   No murmur heard. Respiratory: Effort normal. He has rales.  bibasal rales  GI: Soft. Bowel sounds are normal. He exhibits no distension. There is no tenderness.  Musculoskeletal: He exhibits edema. He exhibits no tenderness.  3+ LE edema  Neurological: He is alert and oriented to person, place, and time. No cranial nerve deficit.  Skin: Skin is warm and dry. No rash noted. No erythema.  Psychiatric: He has a normal mood and affect.    Assessment/Plan: 1. Progressive chronic kidney disease with volume overload: I agree with attempting diuretic therapy over the next 24 hours-if refractory, start dialysis. (He has in place a left upper arm brachiocephalic fistula that is suitable for cannulation). 2. Chest pain: Ongoing cycling of cardiac enzymes-has had negative stress test in September 2015. Anticipate reevaluation by cardiology of cardiac markers positive. 3. Hypertension: Resume antihypertensive therapy and aggressive diuresis. 4. Anemia: We'll  check iron stores and start ESA  5. Metabolic bone disease: Resume calcitriol and follow-up on serum phosphorus levels.  Jon Anderson K. 06/16/2014, 12:08 PM

## 2014-06-17 DIAGNOSIS — I10 Essential (primary) hypertension: Secondary | ICD-10-CM

## 2014-06-17 DIAGNOSIS — R0789 Other chest pain: Principal | ICD-10-CM

## 2014-06-17 DIAGNOSIS — R079 Chest pain, unspecified: Secondary | ICD-10-CM

## 2014-06-17 LAB — GLUCOSE, CAPILLARY
Glucose-Capillary: 81 mg/dL (ref 70–99)
Glucose-Capillary: 114 mg/dL — ABNORMAL HIGH (ref 70–99)
Glucose-Capillary: 139 mg/dL — ABNORMAL HIGH (ref 70–99)
Glucose-Capillary: 89 mg/dL (ref 70–99)

## 2014-06-17 LAB — CBC
HCT: 31.9 % — ABNORMAL LOW (ref 39.0–52.0)
Hemoglobin: 10.7 g/dL — ABNORMAL LOW (ref 13.0–17.0)
MCH: 28 pg (ref 26.0–34.0)
MCHC: 33.5 g/dL (ref 30.0–36.0)
MCV: 83.5 fL (ref 78.0–100.0)
Platelets: 246 10*3/uL (ref 150–400)
RBC: 3.82 MIL/uL — ABNORMAL LOW (ref 4.22–5.81)
RDW: 15.4 % (ref 11.5–15.5)
WBC: 4.6 10*3/uL (ref 4.0–10.5)

## 2014-06-17 LAB — BASIC METABOLIC PANEL
Anion gap: 11 (ref 5–15)
BUN: 77 mg/dL — ABNORMAL HIGH (ref 6–23)
CO2: 22 mmol/L (ref 19–32)
Calcium: 7.4 mg/dL — ABNORMAL LOW (ref 8.4–10.5)
Chloride: 106 mmol/L (ref 96–112)
Creatinine, Ser: 11.3 mg/dL — ABNORMAL HIGH (ref 0.50–1.35)
GFR calc Af Amer: 5 mL/min — ABNORMAL LOW (ref >90)
GFR calc non Af Amer: 4 mL/min — ABNORMAL LOW (ref >90)
Glucose, Bld: 94 mg/dL (ref 70–99)
Potassium: 3.8 mmol/L (ref 3.5–5.1)
Sodium: 139 mmol/L (ref 135–145)

## 2014-06-17 MED ORDER — SODIUM CHLORIDE 0.9 % IV SOLN
250.0000 mg | Freq: Every day | INTRAVENOUS | Status: AC
  Administered 2014-06-17 – 2014-06-18 (×2): 250 mg via INTRAVENOUS
  Filled 2014-06-17 (×4): qty 20

## 2014-06-17 MED ORDER — CALCIUM ACETATE (PHOS BINDER) 667 MG PO CAPS
1334.0000 mg | ORAL_CAPSULE | Freq: Three times a day (TID) | ORAL | Status: DC
  Administered 2014-06-17 – 2014-06-23 (×14): 1334 mg via ORAL
  Filled 2014-06-17 (×22): qty 2

## 2014-06-17 NOTE — Progress Notes (Addendum)
Patient Name: Jon Anderson Date of Encounter: 06/17/2014  Principal Problem:   Acute on chronic renal failure Active Problems:   Hyperlipidemia LDL goal <70   Hypertension   Peripheral arterial disease   PVD (peripheral vascular disease)   Chest pain   Renal failure (ARF), acute on chronic   Primary Cardiologist: Dr. Quay Anderson  Patient Profile: 64 yo male w/ hx of severe CKD (not yet on HD), PAD with prior interventions, non-obstructive CAD, DM2, HTN, and prior history of chest pain. H was admitted on 03/08 w/ chest pain.  SUBJECTIVE: Had one episode of CP at 0200 this a.m., left sided pressure rated it a 5/10, lasted just a few minutes then went away. No other CP since then. No SOB.  OBJECTIVE Filed Vitals:   06/16/14 2000 06/17/14 0119 06/17/14 0540 06/17/14 1001  BP: 136/68 140/56 152/73 139/72  Pulse: 68 56 67 70  Temp: 97.9 F (36.6 C) 98 F (36.7 C) 97.5 F (36.4 C) 97.5 F (36.4 C)  TempSrc: Oral Oral Oral Oral  Resp: 20 20 20 20   Height:      Weight:   184 lb 6.4 oz (83.643 kg)   SpO2: 97% 95% 96% 96%    Intake/Output Summary (Last 24 hours) at 06/17/14 1059 Last data filed at 06/17/14 0938  Gross per 24 hour  Intake   1274 ml  Output   2500 ml  Net  -1226 ml   Filed Weights   06/16/14 0023 06/16/14 1645 06/17/14 0540  Weight: 185 lb (83.915 kg) 187 lb 11.2 oz (85.14 kg) 184 lb 6.4 oz (83.643 kg)    PHYSICAL EXAM General: Well developed, well nourished, male in no acute distress. Head: Normocephalic, atraumatic.  Neck: Supple without bruits, no JVD. Lungs:  Resp regular and unlabored, CTA. Diminished in bilateral lower lobes. Heart: RRR, S1, S2, no S3, S4, or murmur; no rub. Abdomen: Soft, non-tender, non-distended, BS + x 4.  Extremities: No clubbing, cyanosis, 2-3+ edema to bilateral lower extermities. Fistula w/ palpable thrill Neuro: Alert and oriented X 3. Moves all extremities spontaneously. Psych: Flat  affect.  LABS: CBC: Recent Labs  06/16/14 0033 06/16/14 0745 06/17/14 0515  WBC 10.1 7.1 4.6  NEUTROABS 6.4 4.2  --   HGB 10.8* 10.5* 10.7*  HCT 31.9* 31.9* 31.9*  MCV 83.1 83.5 83.5  PLT 279 241 0000000   Basic Metabolic Panel: Recent Labs  06/16/14 0745 06/16/14 1334 06/17/14 0515  NA 140  --  139  K 3.9  --  3.8  CL 109  --  106  CO2 18*  --  22  GLUCOSE 101*  --  94  BUN 71*  --  77*  CREATININE 11.35*  --  11.30*  CALCIUM 6.9*  --  7.4*  PHOS  --  7.1*  --    Liver Function Tests: Recent Labs  06/16/14 0033 06/16/14 0745  AST 32 31  ALT 45 44  ALKPHOS 106 104  BILITOT 0.3 0.5  PROT 7.2 6.6  ALBUMIN 3.5 3.2*   Cardiac Enzymes: Recent Labs  06/16/14 0745 06/16/14 1334  TROPONINI 0.04* 0.03    Recent Labs  06/16/14 0047  TROPIPOC 0.02   BNP:  B NATRIURETIC PEPTIDE  Date/Time Value Ref Range Status  06/16/2014 12:33 AM 432.2* 0.0 - 100.0 pg/mL Final   PRO B NATRIURETIC PEPTIDE (BNP)  Date/Time Value Ref Range Status  03/25/2014 09:45 AM 4548.0* 0 - 125 pg/mL Final  11/14/2012 03:14  PM 685.6* 0 - 125 pg/mL Final   Anemia Panel: Recent Labs  06/16/14 1334  FERRITIN 172  TIBC 219  IRON 58    TELE:  SR, 80.     ECG: SR, J point elevation is unchanged from prior ECGs   Radiology/Studies: Dg Chest 2 View  06/16/2014   CLINICAL DATA:  LEFT-sided chest pain tonight. History of hypertension and diabetes.  EXAM: CHEST  2 VIEW  COMPARISON:  Chest radiograph June 12, 2014  FINDINGS: The cardiac silhouette appears mild to moderately enlarged, increased from prior imaging. Pulmonary vascular congestion, strandy densities in lung bases. Mild interstitial prominence. No pleural effusion. No pneumothorax. Soft tissue planes and included osseous structures are nonsuspicious. Patient is edentulous.  IMPRESSION: Increasing cardiomegaly, pulmonary edema confluent in the lung bases without pleural effusion.   Electronically Signed   By: Jon Anderson   On:  06/16/2014 02:17   Current Medications:  . amLODipine  10 mg Oral Daily  . aspirin EC  81 mg Oral Daily  . atorvastatin  40 mg Oral q1800  . calcitRIOL  0.25 mcg Oral Daily  . calcium acetate  1,334 mg Oral TID WC  . carvedilol  25 mg Oral BID WC  . cloNIDine  0.3 mg Oral BID  . clopidogrel  75 mg Oral Q breakfast  . cyclobenzaprine  10 mg Oral QHS  . ferric gluconate (FERRLECIT/NULECIT) IV  250 mg Intravenous Daily  . furosemide  120 mg Intravenous Q8H  . heparin  5,000 Units Subcutaneous 3 times per day  . insulin aspart  0-9 Units Subcutaneous TID WC  . pantoprazole  40 mg Oral Daily  . sodium chloride  3 mL Intravenous Q12H      ASSESSMENT AND PLAN: Principal Problem:   Acute on chronic renal failure -per IM and Nephrology  Active Problems:   Chest pain -One episode of CP overnight that was relieved within a few minutes without any PRN medications. No SOB. -Troponin levels are flat  - no history of obstructive CAD -Echo pending - cath would be high risk, would reserve for new ECG changes, sig WMA on echo or sig elevated enzymes. - he is on ASA, Plavix, BB, statin, and Norvasc at home doses - PTA was also on Bidil 20-37.5 bid, could try using Imdur at higher doses  Hyperlipidemia LDL goal <70 - Last lipid profile 12/12/13; LDL 134 -Continue atorvastatin  Hypertension -Blood pressure continues to be elevated at times but down significantly since admission. -Continue amlodipine, BB, clonidine, and lasix. -per IM and nephrology  Peripheral arterial disease - s/p PCI L-SFA -Continue clopidogrel and aspirn    Signed, Jon Anderson , PA-C 10:59 AM 06/17/2014  Personally seen and examined. Agree with above. Meds reviewed ESRD Med Jon Rinks, MD

## 2014-06-17 NOTE — Progress Notes (Signed)
Patient ID: Jon Anderson, male   DOB: July 03, 1950, 64 y.o.   MRN: [id number]  [facility] Progress Note    Assessment/ Plan:   1. Progressive chronic kidney disease with volume overload: Some response to diuretic therapy overnight with clinical improvement of his presentation symptoms. Unfortunately, GFR still fairly low and it appears that he is on the cusp of dialysis. Will await another 24 hours prior to deciding on his definitive action plan. 2. Chest pain: Negative cardiac enzymes-further interventions/management per primary service (negative stress test in September, 2015).  3. Hypertension: Blood pressures remain elevated even with escalation of diuretic therapy-monitor with ongoing treatment at this time. 4. Anemia: Iron stores borderline-will give 500 mg of Nulecit over 48 hours 5. Metabolic bone disease: Resume calcitriol, significantly hypophosphatemic (7.1)-start calcium acetate 1334 mg 3 times a day before meals  Subjective:   Reports to be feeling fair-still has occasional exertional chest pain    Objective:   BP 152/73 mmHg  Pulse 67  Temp(Src) 97.5 F (36.4 C) (Oral)  Resp 20  Ht 5\' 11"  (1.803 m)  Wt 83.643 kg (184 lb 6.4 oz)  BMI 25.73 kg/m2  SpO2 96%  Intake/Output Summary (Last 24 hours) at 06/17/14 0854 Last data filed at 06/17/14 [id number]  Gross per 24 hour  Intake   1274 ml  Output   2325 ml  Net  -1051 ml   Weight change: 1.225 kg (2 lb 11.2 oz)  Physical Exam: Gen: Comfortably sitting on the side of his bed, reading CVS: Pulse regular in rate and rhythm Resp: Decreased breath sounds at the bases with intermittent left base crackles Abd: Soft, flat, nontender Ext: 2-3+ lower extremity edema  Imaging: Dg Chest 2 View  06/16/2014   CLINICAL DATA:  LEFT-sided chest pain tonight. History of hypertension and diabetes.  EXAM: CHEST  2 VIEW  COMPARISON:  Chest radiograph June 12, 2014  FINDINGS: The cardiac silhouette appears mild to  moderately enlarged, increased from prior imaging. Pulmonary vascular congestion, strandy densities in lung bases. Mild interstitial prominence. No pleural effusion. No pneumothorax. Soft tissue planes and included osseous structures are nonsuspicious. Patient is edentulous.  IMPRESSION: Increasing cardiomegaly, pulmonary edema confluent in the lung bases without pleural effusion.   Electronically Signed   By: Elon Alas   On: 06/16/2014 02:17    Labs: BMET  Recent Labs Lab 06/16/14 0033 06/16/14 0745 06/16/14 1334 06/17/14 0515  NA 138 140  --  139  K 4.2 3.9  --  3.8  CL 107 109  --  106  CO2 16* 18*  --  22  GLUCOSE 113* 101*  --  94  BUN 70* 71*  --  77*  CREATININE 11.18* 11.35*  --  11.30*  CALCIUM 6.9* 6.9*  --  7.4*  PHOS  --   --  7.1*  --    CBC  Recent Labs Lab 06/16/14 0033 06/16/14 0745 06/17/14 0515  WBC 10.1 7.1 4.6  NEUTROABS 6.4 4.2  --   HGB 10.8* 10.5* 10.7*  HCT 31.9* 31.9* 31.9*  MCV 83.1 83.5 83.5  PLT 279 241 246    Medications:    . amLODipine  10 mg Oral Daily  . aspirin EC  81 mg Oral Daily  . atorvastatin  40 mg Oral q1800  . calcitRIOL  0.25 mcg Oral Daily  . carvedilol  25 mg Oral BID WC  . cloNIDine  0.3 mg Oral BID  . clopidogrel  75 mg Oral  Q breakfast  . cyclobenzaprine  10 mg Oral QHS  . furosemide  120 mg Intravenous Q8H  . heparin  5,000 Units Subcutaneous 3 times per day  . insulin aspart  0-9 Units Subcutaneous TID WC  . pantoprazole  40 mg Oral Daily  . sodium chloride  3 mL Intravenous Q12H   Elmarie Shiley, MD 06/17/2014, 8:54 AM

## 2014-06-17 NOTE — Progress Notes (Signed)
[facility] PROGRESS NOTE  Jon Anderson [id number] DOB: [birth year] DOA: 06/16/2014 PCP: [name], MD  Assessment/Plan: 1. ESRD with Volume overload -has AVF placed 12/10 -on high dose IV lasix per Renal, diuresing well, negative 1.9L -Renal following -Renal Diet, follow i/os  2. Chest pain -? Triggered by Volume overload -negative stress test 9/15, Cards following -Continue ASA/Plavix, Coreg and statin  3. Hypertensive Urgency -improved, Continue amlodipine, clonidine, BB and lasix  4. PAD s/p PCI to L SFA -continue Plavix  5. Anemia of chronic disease -Renal planning IV Iron  DVt proph: Hep SQ  Code Status: Full  Family Communication: none at bedside Disposition Plan: home when stable   Consultants:  Renal  cards   HPI/Subjective: Feels better, breathing improved  Objective: Filed Vitals:   06/17/14 0540  BP: 152/73  Pulse: 67  Temp: 97.5 F (36.4 C)  Resp: 20    Intake/Output Summary (Last 24 hours) at 06/17/14 0836 Last data filed at 06/17/14 0658  Gross per 24 hour  Intake    614 ml  Output   2075 ml  Net  -1461 ml   Filed Weights   06/16/14 0023 06/16/14 1645 06/17/14 0540  Weight: 83.915 kg (185 lb) 85.14 kg (187 lb 11.2 oz) 83.643 kg (184 lb 6.4 oz)    Exam:   General: AAOx3, no distress  Cardiovascular: S1S2/RRR  Respiratory: CTAB  Abdomen: soft, NT, BS present, no organomegaly  Ext: 2plus edema  Data Reviewed: Basic Metabolic Panel:  Recent Labs Lab 06/16/14 0033 06/16/14 0745 06/16/14 1334 06/17/14 0515  NA 138 140  --  139  K 4.2 3.9  --  3.8  CL 107 109  --  106  CO2 16* 18*  --  22  GLUCOSE 113* 101*  --  94  BUN 70* 71*  --  77*  CREATININE 11.18* 11.35*  --  11.30*  CALCIUM 6.9* 6.9*  --  7.4*  PHOS  --   --  7.1*  --    Liver Function Tests:  Recent Labs Lab 06/16/14 0033 06/16/14 0745  AST 32 31  ALT 45 44  ALKPHOS 106 104  BILITOT 0.3 0.5  PROT 7.2 6.6  ALBUMIN 3.5 3.2*    No results for input(s): LIPASE, AMYLASE in the last 168 hours. No results for input(s): AMMONIA in the last 168 hours. CBC:  Recent Labs Lab 06/16/14 0033 06/16/14 0745 06/17/14 0515  WBC 10.1 7.1 4.6  NEUTROABS 6.4 4.2  --   HGB 10.8* 10.5* 10.7*  HCT 31.9* 31.9* 31.9*  MCV 83.1 83.5 83.5  PLT 279 241 246   Cardiac Enzymes:  Recent Labs Lab 06/16/14 0745 06/16/14 1334  TROPONINI 0.04* 0.03   BNP (last 3 results)  Recent Labs  06/16/14 0033  BNP 432.2*    ProBNP (last 3 results)  Recent Labs  03/25/14 0945  PROBNP 4548.0*    CBG:  Recent Labs Lab 06/16/14 0734 06/16/14 1241 06/16/14 1652 06/16/14 2050 06/17/14 0629  GLUCAP 101* 109* 111* 134* 81    No results found for this or any previous visit (from the past 240 hour(s)).   Studies: Dg Chest 2 View  06/16/2014   CLINICAL DATA:  LEFT-sided chest pain tonight. History of hypertension and diabetes.  EXAM: CHEST  2 VIEW  COMPARISON:  Chest radiograph June 12, 2014  FINDINGS: The cardiac silhouette appears mild to moderately enlarged, increased from prior imaging. Pulmonary vascular congestion, strandy densities in lung bases. Mild interstitial prominence. No  pleural effusion. No pneumothorax. Soft tissue planes and included osseous structures are nonsuspicious. Patient is edentulous.  IMPRESSION: Increasing cardiomegaly, pulmonary edema confluent in the lung bases without pleural effusion.   Electronically Signed   By: Elon Alas   On: 06/16/2014 02:17    Scheduled Meds: . amLODipine  10 mg Oral Daily  . aspirin EC  81 mg Oral Daily  . atorvastatin  40 mg Oral q1800  . calcitRIOL  0.25 mcg Oral Daily  . carvedilol  25 mg Oral BID WC  . cloNIDine  0.3 mg Oral BID  . clopidogrel  75 mg Oral Q breakfast  . cyclobenzaprine  10 mg Oral QHS  . furosemide  120 mg Intravenous Q8H  . heparin  5,000 Units Subcutaneous 3 times per day  . insulin aspart  0-9 Units Subcutaneous TID WC  . pantoprazole   40 mg Oral Daily  . sodium chloride  3 mL Intravenous Q12H   Continuous Infusions:  Antibiotics Given (last 72 hours)    None      Principal Problem:   Acute on chronic renal failure Active Problems:   Hyperlipidemia LDL goal <70   Hypertension   Peripheral arterial disease   PVD (peripheral vascular disease)   Chest pain   Renal failure (ARF), acute on chronic    Time spent: 19min    [facility] Pager [phone]. If 7PM-7AM, please contact night-coverage at [email or web address], password [facility] 06/17/2014, 8:36 AM  LOS: 1 day

## 2014-06-17 NOTE — Progress Notes (Signed)
*  PRELIMINARY RESULTS* Echocardiogram 2D Echocardiogram has been performed.  Leavy Cella 06/17/2014, 3:05 PM

## 2014-06-18 LAB — BASIC METABOLIC PANEL
Anion gap: 11 (ref 5–15)
BUN: 81 mg/dL — ABNORMAL HIGH (ref 6–23)
CO2: 19 mmol/L (ref 19–32)
Calcium: 6.7 mg/dL — ABNORMAL LOW (ref 8.4–10.5)
Chloride: 105 mmol/L (ref 96–112)
Creatinine, Ser: 11.61 mg/dL — ABNORMAL HIGH (ref 0.50–1.35)
GFR calc Af Amer: 5 mL/min — ABNORMAL LOW (ref >90)
GFR calc non Af Amer: 4 mL/min — ABNORMAL LOW (ref >90)
Glucose, Bld: 117 mg/dL — ABNORMAL HIGH (ref 70–99)
Potassium: 3.9 mmol/L (ref 3.5–5.1)
Sodium: 135 mmol/L (ref 135–145)

## 2014-06-18 LAB — CBC
HCT: 31.3 % — ABNORMAL LOW (ref 39.0–52.0)
Hemoglobin: 10.5 g/dL — ABNORMAL LOW (ref 13.0–17.0)
MCH: 28.3 pg (ref 26.0–34.0)
MCHC: 33.5 g/dL (ref 30.0–36.0)
MCV: 84.4 fL (ref 78.0–100.0)
Platelets: 216 10*3/uL (ref 150–400)
RBC: 3.71 MIL/uL — ABNORMAL LOW (ref 4.22–5.81)
RDW: 15.3 % (ref 11.5–15.5)
WBC: 4.7 10*3/uL (ref 4.0–10.5)

## 2014-06-18 LAB — GLUCOSE, CAPILLARY
Glucose-Capillary: 106 mg/dL — ABNORMAL HIGH (ref 70–99)
Glucose-Capillary: 105 mg/dL — ABNORMAL HIGH (ref 70–99)
Glucose-Capillary: 215 mg/dL — ABNORMAL HIGH (ref 70–99)

## 2014-06-18 MED ORDER — ISOSORBIDE MONONITRATE ER 30 MG PO TB24
30.0000 mg | ORAL_TABLET | Freq: Every day | ORAL | Status: DC
  Administered 2014-06-19 – 2014-06-21 (×3): 30 mg via ORAL
  Filled 2014-06-18 (×4): qty 1

## 2014-06-18 NOTE — Care Management Note (Unsigned)
    Page 1 of 1   06/18/2014     4:20:06 PM CARE MANAGEMENT NOTE 06/18/2014  Patient:  Jon Anderson, Jon Anderson   Account Number:  [id number]  Date Initiated:  06/18/2014  Documentation initiated by:  Najah Liverman  Subjective/Objective Assessment:   Pt adm on 06/16/14 with CHF, renal failure.  PTA, pt resided at home with spouse.     Action/Plan:   Will follow for dc planning as pt progresses.   Anticipated DC Date:  06/22/2014   Anticipated DC Plan:  [place]  CM consult      Choice offered to / List presented to:             Status of service:  In process, will continue to follow Medicare Important Message given?  NO (If response is "NO", the following Medicare IM given date fields will be blank) Date Medicare IM given:   Medicare IM given by:   Date Additional Medicare IM given:   Additional Medicare IM given by:    Discharge Disposition:    Per UR Regulation:  Reviewed for med. necessity/level of care/duration of stay  If discussed at [place] of Stay Meetings, dates discussed:    Comments:

## 2014-06-18 NOTE — Progress Notes (Signed)
Patient ID: Jon Anderson, male   DOB: [birth year], 64 y.o.   MRN: [id number]  [facility] Progress Note    Assessment/ Plan:   1. Progressive chronic kidney disease with volume overload: Data evaluated-fair response to furosemide but with currently low kidney function, we need to start dialysis. Discussed with the patient regarding the plan, he is willing to undertake dialysis. Order for hemodialysis today and start process for outpatient dialysis unit placement. 2. Chest pain:  Still having intermittent chest pain-ongoing cardiology evaluation/workup. 3. Hypertension: Blood pressures marginally elevated-anticipate will improve with ultrafiltration and hemodialysis. 4. Anemia: Iron stores low, continue intravenous iron for repletion 5. Metabolic bone disease: Resume calcitriol, started on calcium acetate for phosphorus binding in-monitor phosphorus trend.   Subjective:   Reports to be feeling fair-occasional chest pain. Apprehensive about chronic dialysis.   Objective:   BP 156/72 mmHg  Pulse 71  Temp(Src) 97.8 F (36.6 C) (Oral)  Resp 16  Ht 5\' 11"  (1.803 m)  Wt 83.416 kg (183 lb 14.4 oz)  BMI 25.66 kg/m2  SpO2 94%  Intake/Output Summary (Last 24 hours) at 06/18/14 0900 Last data filed at 06/18/14 [id number]  Gross per 24 hour  Intake   1228 ml  Output   2475 ml  Net  -1247 ml   Weight change: -1.724 kg (-3 lb 12.8 oz)  Physical Exam: [id number] resting in bed, watching television  [id number] regular in rate and rhythm, S1 and S2 normal  Resp: Decreased breath sounds over bases-no rales  [id number], flat, nontender  Ext:2-3+ edema  Imaging: No results found.  Labs: BMET  Recent Labs Lab 06/16/14 0033 06/16/14 0745 06/16/14 1334 06/17/14 0515 06/18/14 0559  NA 138 140  --  139 135  K 4.2 3.9  --  3.8 3.9  CL 107 109  --  106 105  CO2 16* 18*  --  22 19  GLUCOSE 113* 101*  --  94 117*  BUN 70* 71*  --  77* 81*  CREATININE 11.18* 11.35*  --   11.30* 11.61*  CALCIUM 6.9* 6.9*  --  7.4* 6.7*  PHOS  --   --  7.1*  --   --    CBC  Recent Labs Lab 06/16/14 0033 06/16/14 0745 06/17/14 0515 06/18/14 0559  WBC 10.1 7.1 4.6 4.7  NEUTROABS 6.4 4.2  --   --   HGB 10.8* 10.5* 10.7* 10.5*  HCT 31.9* 31.9* 31.9* 31.3*  MCV 83.1 83.5 83.5 84.4  PLT 279 241 246 216    Medications:    . amLODipine  10 mg Oral Daily  . aspirin EC  81 mg Oral Daily  . atorvastatin  40 mg Oral q1800  . calcitRIOL  0.25 mcg Oral Daily  . calcium acetate  1,334 mg Oral TID WC  . carvedilol  25 mg Oral BID WC  . cloNIDine  0.3 mg Oral BID  . clopidogrel  75 mg Oral Q breakfast  . cyclobenzaprine  10 mg Oral QHS  . ferric gluconate (FERRLECIT/NULECIT) IV  250 mg Intravenous Daily  . furosemide  120 mg Intravenous Q8H  . heparin  5,000 Units Subcutaneous 3 times per day  . insulin aspart  0-9 Units Subcutaneous TID WC  . pantoprazole  40 mg Oral Daily  . sodium chloride  3 mL Intravenous Q12H   Elmarie Shiley, MD 06/18/2014, 9:00 AM

## 2014-06-18 NOTE — Progress Notes (Signed)
06/18/2014 2:22 PM Hemodialysis Outpatient Note; I have begun the "CLIP" process on this patient. We are looking for a schedule from the  [facility] dialysis center. Thank you. Gordy Savers

## 2014-06-18 NOTE — Progress Notes (Signed)
Patient Profile: 64 yo male w/ hx of severe CKD (not yet on HD), PAD with prior interventions, non-obstructive CAD, DM2, HTN, and prior history of chest pain. H was admitted on 03/08 w/ chest pain.   Subjective: St ill with lt ant chest pain that comes and goes. + SOB with it.   Objective: Vital signs in last 24 hours: Temp:  [97.5 F (36.4 C)-97.8 F (36.6 C)] 97.8 F (36.6 C) (03/10 0637) Pulse Rate:  [68-72] 71 (03/10 0637) Resp:  [16-20] 16 (03/10 0637) BP: (134-156)/(67-75) 156/72 mmHg (03/10 0637) SpO2:  [94 %-98 %] 94 % (03/10 0637) Weight:  [183 lb 14.4 oz (83.416 kg)] 183 lb 14.4 oz (83.416 kg) (03/10 [id number]) Weight change: -3 lb 12.8 oz (-1.724 kg) Last BM Date: 06/15/14 Intake/Output from previous day: -837 ( -2748 since admit) wt 183 down from 187 since admit) 03/09 0701 - 03/10 0700 In: 1888 [P.O.:1582; IV Piggyback:306] Out: 2725 [Urine:2725] Intake/Output this shift:    PE: General:Pleasant affect, NAD Skin:Warm and dry, brisk capillary refill HEENT:normocephalic, sclera clear, mucus membranes moist Heart:S1S2 RRR without murmur, gallup, rub or click Lungs:clear without rales, rhonchi, or wheezes [id number], non tender, + BS, do not palpate liver spleen or masses Ext:no to trace lower ext edema- improved with diuresing , 2+ pedal pulses, 2+ radial pulses Neuro:alert and oriented X 3, MAE, follows commands, + facial symmetry TELE: SR with PACs   Lab Results:  Recent Labs  06/17/14 0515 06/18/14 0559  WBC 4.6 4.7  HGB 10.7* 10.5*  HCT 31.9* 31.3*  PLT 246 216   BMET  Recent Labs  06/17/14 0515 06/18/14 0559  NA 139 135  K 3.8 3.9  CL 106 105  CO2 22 19  GLUCOSE 94 117*  BUN 77* 81*  CREATININE 11.30* 11.61*  CALCIUM 7.4* 6.7*    Recent Labs  06/16/14 0745 06/16/14 1334  TROPONINI 0.04* 0.03     Hepatic Function Panel  Recent Labs  06/16/14 0745  PROT 6.6  ALBUMIN 3.2*  AST 31  ALT 44  ALKPHOS 104  BILITOT 0.5   No  results for input(s): CHOL in the last 72 hours. No results for input(s): PROTIME in the last 72 hours.     Studies/Results: ECHO: Left ventricle: The cavity size was normal. Systolic function was normal. The estimated ejection fraction was in the range of 55% to 60%. Wall motion was normal; there were no regional wall motion abnormalities. Features are consistent with a pseudonormal left ventricular filling pattern, with concomitant abnormal relaxation and increased filling pressure (grade 2 diastolic dysfunction). - Aortic valve: Trileaflet; normal thickness, mildly calcified leaflets. - Mitral valve: There was mild regurgitation. - Left atrium: The atrium was mildly dilated.  Medications: I have reviewed the patient's current medications. Scheduled Meds: . amLODipine  10 mg Oral Daily  . aspirin EC  81 mg Oral Daily  . atorvastatin  40 mg Oral q1800  . calcitRIOL  0.25 mcg Oral Daily  . calcium acetate  1,334 mg Oral TID WC  . carvedilol  25 mg Oral BID WC  . cloNIDine  0.3 mg Oral BID  . clopidogrel  75 mg Oral Q breakfast  . cyclobenzaprine  10 mg Oral QHS  . ferric gluconate (FERRLECIT/NULECIT) IV  250 mg Intravenous Daily  . furosemide  120 mg Intravenous Q8H  . heparin  5,000 Units Subcutaneous 3 times per day  . insulin aspart  0-9 Units Subcutaneous TID WC  .  pantoprazole  40 mg Oral Daily  . sodium chloride  3 mL Intravenous Q12H   Continuous Infusions:  PRN Meds:.acetaminophen **OR** acetaminophen, HYDROcodone-acetaminophen, morphine injection, nitroGLYCERIN, ondansetron **OR** ondansetron (ZOFRAN) IV  Assessment/Plan: Principal Problem:   Acute on chronic renal failure-Cr 11 today - renal following - on cusp of dialysis, to decide today.  Active Problems:   Hyperlipidemia LDL goal <70 on lipitor 40, LDL 134 in 12/2013    Hypertension mostly controlled, up this am 156/72 Continue amlodipine, BB, clonidine, and lasix- 120 mg every 8 hours. -per  IM and nephrology     PVD (peripheral vascular disease) s/p PCI L-SFA -Continue clopidogrel and aspirn-    Chest pain- non obstructive CAD with cath 08/2010 LAD 25-30% stenosis, 1st diag small with 80% stenosis, AV grove 25% lesions, RCA large with with 25% stenosis. Normal stress 12/2013.  One troponin minimally elevated at 0.04, may be due to renal failure -  Echo with EF 55-60% and normal wall motion, G2DD.   If continued chest pain, cath option if dialysis is started. Add IMDUR    Anemia due to chronic disease.     Renal failure (ARF), acute on chronic  Volume overload- improved -2748 sine admit    LOS: 2 days   Time spent with pt. : 15 minutes. [facility] R  Nurse Practitioner Certified Pager [phone] or after 5pm and on weekends call [phone] 06/18/2014, 7:51 AM   Personally seen and examined. Agree with above. He has chosen to proceed with dialysis. I would like to see how he feels over the next several days/ weeks and if chest pain continues or worsens, proceed with heart cath. Recent NUC reassuring. I don't think we need to keep him here to monitor chest pain. This decision can be made as outpatient unless he clearly manifests himself during new start HD. BP should improve as HD starts.   Candee Furbish, MD

## 2014-06-18 NOTE — Progress Notes (Signed)
[facility] PROGRESS NOTE  QUENTRELL RODEMAN [id number] DOB: [birth year] DOA: 06/16/2014 PCP: [name], MD  Assessment/Plan: 1. ESRD with Volume overload -has AVF placed 12/10 -Renal following, plan to start HD today -Renal Diet, follow I/Os  2. Chest pain -? Triggered by Volume overload -negative stress test 9/15, Cards following -Continue ASA/Plavix, Coreg and statin  3. Hypertensive Urgency -improved, Continue amlodipine, clonidine, BB and lasix  4. PAD s/p PCI to L SFA -continue Plavix  5. Anemia of chronic disease -s/p IV Iron  DVt proph: Hep SQ  Ambulate, Pt/OT  Code Status: Full  Family Communication: none at bedside Disposition Plan: home when stable   Consultants:  Renal  cards   HPI/Subjective: Feels better, breathing improved, swelling coming down  Objective: Filed Vitals:   06/18/14 1154  BP: 158/73  Pulse: 78  Temp:   Resp: 16    Intake/Output Summary (Last 24 hours) at 06/18/14 1353 Last data filed at 06/18/14 1300  Gross per 24 hour  Intake   1328 ml  Output   2700 ml  Net  -1372 ml   Filed Weights   06/16/14 1645 06/17/14 0540 06/18/14 0637  Weight: 85.14 kg (187 lb 11.2 oz) 83.643 kg (184 lb 6.4 oz) 83.416 kg (183 lb 14.4 oz)    Exam:   General: AAOx3, no distress  Cardiovascular: S1S2/RRR  Respiratory: CTAB  Abdomen: soft, NT, BS present, no organomegaly  Ext: 2plus edema  Data Reviewed: Basic Metabolic Panel:  Recent Labs Lab 06/16/14 0033 06/16/14 0745 06/16/14 1334 06/17/14 0515 06/18/14 0559  NA 138 140  --  139 135  K 4.2 3.9  --  3.8 3.9  CL 107 109  --  106 105  CO2 16* 18*  --  22 19  GLUCOSE 113* 101*  --  94 117*  BUN 70* 71*  --  77* 81*  CREATININE 11.18* 11.35*  --  11.30* 11.61*  CALCIUM 6.9* 6.9*  --  7.4* 6.7*  PHOS  --   --  7.1*  --   --    Liver Function Tests:  Recent Labs Lab 06/16/14 0033 06/16/14 0745  AST 32 31  ALT 45 44  ALKPHOS 106 104  BILITOT 0.3 0.5   PROT 7.2 6.6  ALBUMIN 3.5 3.2*   No results for input(s): LIPASE, AMYLASE in the last 168 hours. No results for input(s): AMMONIA in the last 168 hours. CBC:  Recent Labs Lab 06/16/14 0033 06/16/14 0745 06/17/14 0515 06/18/14 0559  WBC 10.1 7.1 4.6 4.7  NEUTROABS 6.4 4.2  --   --   HGB 10.8* 10.5* 10.7* 10.5*  HCT 31.9* 31.9* 31.9* 31.3*  MCV 83.1 83.5 83.5 84.4  PLT 279 241 246 216   Cardiac Enzymes:  Recent Labs Lab 06/16/14 0745 06/16/14 1334  TROPONINI 0.04* 0.03   BNP (last 3 results)  Recent Labs  06/16/14 0033  BNP 432.2*    ProBNP (last 3 results)  Recent Labs  03/25/14 0945  PROBNP 4548.0*    CBG:  Recent Labs Lab 06/17/14 1115 06/17/14 1603 06/17/14 2053 06/18/14 0608 06/18/14 1122  GLUCAP 114* 139* 89 106* 105*    No results found for this or any previous visit (from the past 240 hour(s)).   Studies: No results found.  Scheduled Meds: . amLODipine  10 mg Oral Daily  . aspirin EC  81 mg Oral Daily  . atorvastatin  40 mg Oral q1800  . calcitRIOL  0.25 mcg Oral Daily  .  calcium acetate  1,334 mg Oral TID WC  . carvedilol  25 mg Oral BID WC  . cloNIDine  0.3 mg Oral BID  . clopidogrel  75 mg Oral Q breakfast  . cyclobenzaprine  10 mg Oral QHS  . ferric gluconate (FERRLECIT/NULECIT) IV  250 mg Intravenous Daily  . furosemide  120 mg Intravenous Q8H  . heparin  5,000 Units Subcutaneous 3 times per day  . insulin aspart  0-9 Units Subcutaneous TID WC  . isosorbide mononitrate  30 mg Oral Daily  . pantoprazole  40 mg Oral Daily  . sodium chloride  3 mL Intravenous Q12H   Continuous Infusions:  Antibiotics Given (last 72 hours)    None      Principal Problem:   Acute on chronic renal failure Active Problems:   Hyperlipidemia LDL goal <70   Hypertension   Peripheral arterial disease   PVD (peripheral vascular disease)   Chest pain   Renal failure (ARF), acute on chronic    Time spent:  23min    [place] Pager [phone]. If 7PM-7AM, please contact night-coverage at [email or web address], password [facility] 06/18/2014, 1:53 PM  LOS: 2 days

## 2014-06-18 NOTE — Progress Notes (Signed)
Report given to [facility] from [facility] the patient notified

## 2014-06-19 LAB — CBC
HCT: 30.1 % — ABNORMAL LOW (ref 39.0–52.0)
Hemoglobin: 10.2 g/dL — ABNORMAL LOW (ref 13.0–17.0)
MCH: 28.2 pg (ref 26.0–34.0)
MCHC: 33.9 g/dL (ref 30.0–36.0)
MCV: 83.1 fL (ref 78.0–100.0)
Platelets: 253 10*3/uL (ref 150–400)
RBC: 3.62 MIL/uL — ABNORMAL LOW (ref 4.22–5.81)
RDW: 15.1 % (ref 11.5–15.5)
WBC: 4.7 10*3/uL (ref 4.0–10.5)

## 2014-06-19 LAB — RENAL FUNCTION PANEL
Albumin: 2.9 g/dL — ABNORMAL LOW (ref 3.5–5.2)
Anion gap: 11 (ref 5–15)
BUN: 63 mg/dL — ABNORMAL HIGH (ref 6–23)
CO2: 24 mmol/L (ref 19–32)
Calcium: 7.5 mg/dL — ABNORMAL LOW (ref 8.4–10.5)
Chloride: 101 mmol/L (ref 96–112)
Creatinine, Ser: 9.67 mg/dL — ABNORMAL HIGH (ref 0.50–1.35)
GFR calc Af Amer: 6 mL/min — ABNORMAL LOW (ref >90)
GFR calc non Af Amer: 5 mL/min — ABNORMAL LOW (ref >90)
Glucose, Bld: 112 mg/dL — ABNORMAL HIGH (ref 70–99)
Phosphorus: 5.4 mg/dL — ABNORMAL HIGH (ref 2.3–4.6)
Potassium: 3.8 mmol/L (ref 3.5–5.1)
Sodium: 136 mmol/L (ref 135–145)

## 2014-06-19 LAB — URINALYSIS, ROUTINE W REFLEX MICROSCOPIC
Bilirubin Urine: NEGATIVE
Glucose, UA: 250 mg/dL — AB
Ketones, ur: NEGATIVE mg/dL
Leukocytes, UA: NEGATIVE
Nitrite: NEGATIVE
Protein, ur: >300 mg/dL — AB
Specific Gravity, Urine: 1.012 (ref 1.005–1.030)
Urobilinogen, UA: 0.2 mg/dL (ref 0.0–1.0)
pH: 7 (ref 5.0–8.0)

## 2014-06-19 LAB — URINE MICROSCOPIC-ADD ON

## 2014-06-19 LAB — GLUCOSE, CAPILLARY
Glucose-Capillary: 86 mg/dL (ref 70–99)
Glucose-Capillary: 184 mg/dL — ABNORMAL HIGH (ref 70–99)
Glucose-Capillary: 119 mg/dL — ABNORMAL HIGH (ref 70–99)

## 2014-06-19 LAB — HEPATITIS B SURFACE ANTIGEN: Hepatitis B Surface Ag: NEGATIVE

## 2014-06-19 MED ORDER — DARBEPOETIN ALFA 40 MCG/0.4ML IJ SOSY
40.0000 ug | PREFILLED_SYRINGE | Freq: Once | INTRAMUSCULAR | Status: AC
  Administered 2014-06-19: 40 ug via INTRAVENOUS
  Filled 2014-06-19: qty 0.4

## 2014-06-19 MED ORDER — DARBEPOETIN ALFA 40 MCG/0.4ML IJ SOSY
PREFILLED_SYRINGE | INTRAMUSCULAR | Status: AC
  Filled 2014-06-19: qty 0.4

## 2014-06-19 MED ORDER — NEPRO/CARBSTEADY PO LIQD
237.0000 mL | ORAL | Status: DC | PRN
  Filled 2014-06-19: qty 237

## 2014-06-19 MED ORDER — PENTAFLUOROPROP-TETRAFLUOROETH EX AERO: 1.0000 "application " | INHALATION_SPRAY | CUTANEOUS | Status: DC | PRN

## 2014-06-19 MED ORDER — SODIUM CHLORIDE 0.9 % IV SOLN: 100.0000 mL | INTRAVENOUS | Status: DC | PRN

## 2014-06-19 MED ORDER — HEPARIN SODIUM (PORCINE) 1000 UNIT/ML DIALYSIS: 40.0000 [IU]/kg | INTRAMUSCULAR | Status: DC | PRN

## 2014-06-19 MED ORDER — HEPARIN SODIUM (PORCINE) 1000 UNIT/ML DIALYSIS: 2500.0000 [IU] | INTRAMUSCULAR | Status: DC | PRN

## 2014-06-19 MED ORDER — LIDOCAINE HCL (PF) 1 % IJ SOLN: 5.0000 mL | INTRAMUSCULAR | Status: DC | PRN

## 2014-06-19 MED ORDER — ALTEPLASE 2 MG IJ SOLR
2.0000 mg | Freq: Once | INTRAMUSCULAR | Status: DC | PRN
  Filled 2014-06-19: qty 2

## 2014-06-19 MED ORDER — HEPARIN SODIUM (PORCINE) 1000 UNIT/ML DIALYSIS: 1000.0000 [IU] | INTRAMUSCULAR | Status: DC | PRN

## 2014-06-19 MED ORDER — LIDOCAINE-PRILOCAINE 2.5-2.5 % EX CREA
1.0000 "application " | TOPICAL_CREAM | CUTANEOUS | Status: DC | PRN
  Filled 2014-06-19: qty 5

## 2014-06-19 NOTE — Care Management Note (Signed)
Spoke with Adela Lank in Hemodialysis:  She states that CLIP process has been started, and they are working on it presently.    Will continue to follow up with HD on outpatient dialysis arrangements.    Lionel December, RN, BSN Phone [phone]

## 2014-06-19 NOTE — Procedures (Signed)
Patient seen on Hemodialysis. QB 250, UF goal 2.5L Treatment adjusted as needed.  Elmarie Shiley MD [facility]. Office # [phone] Pager # [phone] 11:58 AM

## 2014-06-19 NOTE — Progress Notes (Signed)
[facility] PROGRESS NOTE  Jon Anderson [id number] DOB: [birth year] DOA: 06/16/2014 PCP: [name], MD  Assessment/Plan: 1. ESRD with Volume overload -AVF placed 12/10 -Renal following, started HD 3/10, tolerated well -Renal Diet, follow I/Os -Diet education -? Stop Lasix, defer to Renal  2. Chest pain -? Triggered by Volume overload -negative stress test 9/15, Cards following-recommend Outpatient FU -Continue ASA/Plavix, Coreg and statin  3. Hypertensive Urgency -improved, Continue amlodipine, clonidine, BB -? Stop lasix  4. PAD s/p PCI to L SFA -continue Plavix  5. Anemia of chronic disease -s/p IV Iron  DVt proph: Hep SQ  Ambulate, Pt/OT  Code Status: Full  Family Communication: none at bedside Disposition Plan: home when stable, initiate CLIP for HD   Consultants:  Renal  cards   HPI/Subjective: No complainst, tolerated HD well  Objective: Filed Vitals:   06/19/14 0653  BP: 152/72  Pulse: 76  Temp: 98 F (36.7 C)  Resp: 16    Intake/Output Summary (Last 24 hours) at 06/19/14 0810 Last data filed at 06/19/14 0648  Gross per 24 hour  Intake    820 ml  Output   3075 ml  Net  -2255 ml   Filed Weights   06/18/14 1653 06/18/14 1903 06/19/14 0653  Weight: 83 kg (182 lb 15.7 oz) 82 kg (180 lb 12.4 oz) 80.559 kg (177 lb 9.6 oz)    Exam:   General: AAOx3, no distress  Cardiovascular: S1S2/RRR  Respiratory: CTAB  Abdomen: soft, NT, BS present, no organomegaly  Ext: trace edema  Data Reviewed: Basic Metabolic Panel:  Recent Labs Lab 06/16/14 0033 06/16/14 0745 06/16/14 1334 06/17/14 0515 06/18/14 0559  NA 138 140  --  139 135  K 4.2 3.9  --  3.8 3.9  CL 107 109  --  106 105  CO2 16* 18*  --  22 19  GLUCOSE 113* 101*  --  94 117*  BUN 70* 71*  --  77* 81*  CREATININE 11.18* 11.35*  --  11.30* 11.61*  CALCIUM 6.9* 6.9*  --  7.4* 6.7*  PHOS  --   --  7.1*  --   --    Liver Function Tests:  Recent Labs Lab  06/16/14 0033 06/16/14 0745  AST 32 31  ALT 45 44  ALKPHOS 106 104  BILITOT 0.3 0.5  PROT 7.2 6.6  ALBUMIN 3.5 3.2*   No results for input(s): LIPASE, AMYLASE in the last 168 hours. No results for input(s): AMMONIA in the last 168 hours. CBC:  Recent Labs Lab 06/16/14 0033 06/16/14 0745 06/17/14 0515 06/18/14 0559  WBC 10.1 7.1 4.6 4.7  NEUTROABS 6.4 4.2  --   --   HGB 10.8* 10.5* 10.7* 10.5*  HCT 31.9* 31.9* 31.9* 31.3*  MCV 83.1 83.5 83.5 84.4  PLT 279 241 246 216   Cardiac Enzymes:  Recent Labs Lab 06/16/14 0745 06/16/14 1334  TROPONINI 0.04* 0.03   BNP (last 3 results)  Recent Labs  06/16/14 0033  BNP 432.2*    ProBNP (last 3 results)  Recent Labs  03/25/14 0945  PROBNP 4548.0*    CBG:  Recent Labs Lab 06/17/14 2053 06/18/14 0608 06/18/14 1122 06/18/14 2144 06/19/14 0706  GLUCAP 89 106* 105* 215* 86    No results found for this or any previous visit (from the past 240 hour(s)).   Studies: No results found.  Scheduled Meds: . amLODipine  10 mg Oral Daily  . aspirin EC  81 mg Oral Daily  .  atorvastatin  40 mg Oral q1800  . calcitRIOL  0.25 mcg Oral Daily  . calcium acetate  1,334 mg Oral TID WC  . carvedilol  25 mg Oral BID WC  . cloNIDine  0.3 mg Oral BID  . clopidogrel  75 mg Oral Q breakfast  . cyclobenzaprine  10 mg Oral QHS  . furosemide  120 mg Intravenous Q8H  . heparin  5,000 Units Subcutaneous 3 times per day  . insulin aspart  0-9 Units Subcutaneous TID WC  . isosorbide mononitrate  30 mg Oral Daily  . pantoprazole  40 mg Oral Daily  . sodium chloride  3 mL Intravenous Q12H   Continuous Infusions:  Antibiotics Given (last 72 hours)    None      Principal Problem:   Acute on chronic renal failure Active Problems:   Hyperlipidemia LDL goal <70   Hypertension   Peripheral arterial disease   PVD (peripheral vascular disease)   Chest pain   Renal failure (ARF), acute on chronic    Time spent:  48min    [facility] Pager [phone]. If 7PM-7AM, please contact night-coverage at [email or web address], password [facility] 06/19/2014, 8:10 AM  LOS: 3 days

## 2014-06-19 NOTE — Progress Notes (Signed)
Patient ID: Jon Anderson, male   DOB: 05/28/1950, 64 y.o.   MRN: [id number]  [facility] Progress Note    Assessment/ Plan:   1. End stage renal disease: With progressive chronic kidney disease and admitted with relative refractoriness to diuretic therapy. Hemodialysis started yesterday and second treatment due today. Process initiated for outpatient dialysis unit placement. Successfully using his left brachiocephalic fistula. 2. Chest pain: Still having intermittent chest pain-ongoing cardiology evaluation/workup. 3. Hypertension: Blood pressures marginally elevated- improving with ultrafiltration and hemodialysis. 4. Anemia: Iron stores low, continue intravenous iron for repletion. Will give a single dose of Aranesp 5. Metabolic bone disease: Resume calcitriol, started on calcium acetate for phosphorus binding in-monitor phosphorus trend.   Subjective:   Reports to be feeling well, anxious about missing work    Objective:   BP 152/72 mmHg  Pulse 76  Temp(Src) 98 F (36.7 C) (Oral)  Resp 16  Ht 5\' 11"  (1.803 m)  Wt 80.559 kg (177 lb 9.6 oz)  BMI 24.78 kg/m2  SpO2 96%  Intake/Output Summary (Last 24 hours) at 06/19/14 0856 Last data filed at 06/19/14 [id number]  Gross per 24 hour  Intake    944 ml  Output   3075 ml  Net  -2131 ml   Weight change: -0.416 kg (-14.7 oz)  Physical Exam: Gen: Comfortably sitting up in side of his bed CVS: Pulse regular in rate and rhythm Resp: Clear to auscultation, no rales Abd: Soft, obese, nontender Ext: 2+ to 3+ lower extremity edema  Imaging: No results found.  Labs: BMET  Recent Labs Lab 06/16/14 0033 06/16/14 0745 06/16/14 1334 06/17/14 0515 06/18/14 0559  NA 138 140  --  139 135  K 4.2 3.9  --  3.8 3.9  CL 107 109  --  106 105  CO2 16* 18*  --  22 19  GLUCOSE 113* 101*  --  94 117*  BUN 70* 71*  --  77* 81*  CREATININE 11.18* 11.35*  --  11.30* 11.61*  CALCIUM 6.9* 6.9*  --  7.4* 6.7*  PHOS  --   --   7.1*  --   --    CBC  Recent Labs Lab 06/16/14 0033 06/16/14 0745 06/17/14 0515 06/18/14 0559  WBC 10.1 7.1 4.6 4.7  NEUTROABS 6.4 4.2  --   --   HGB 10.8* 10.5* 10.7* 10.5*  HCT 31.9* 31.9* 31.9* 31.3*  MCV 83.1 83.5 83.5 84.4  PLT 279 241 246 216    Medications:    . amLODipine  10 mg Oral Daily  . aspirin EC  81 mg Oral Daily  . atorvastatin  40 mg Oral q1800  . calcitRIOL  0.25 mcg Oral Daily  . calcium acetate  1,334 mg Oral TID WC  . carvedilol  25 mg Oral BID WC  . cloNIDine  0.3 mg Oral BID  . clopidogrel  75 mg Oral Q breakfast  . cyclobenzaprine  10 mg Oral QHS  . furosemide  120 mg Intravenous Q8H  . heparin  5,000 Units Subcutaneous 3 times per day  . insulin aspart  0-9 Units Subcutaneous TID WC  . isosorbide mononitrate  30 mg Oral Daily  . pantoprazole  40 mg Oral Daily  . sodium chloride  3 mL Intravenous Q12H   Elmarie Shiley, MD 06/19/2014, 8:56 AM

## 2014-06-19 NOTE — Plan of Care (Signed)
Problem: Food- and Nutrition-Related Knowledge Deficit (NB-1.1) Goal: Nutrition education Formal process to instruct or train a patient/client in a skill or to impart knowledge to help patients/clients voluntarily manage or modify food choices and eating behavior to maintain or improve health. Outcome: Completed/Met Date Met:  06/19/14 Nutrition Education Note   RD consulted for Renal Education.  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 Pt does not report following any diet restrictions at home.   Provided Choose-A-Meal Booklet to patient/family. Reviewed food groups and provided written recommended serving sizes specifically determined for patient's current nutritional status.   Explained why diet restrictions are needed and provided lists of foods to limit/avoid that are high potassium, sodium, and phosphorus. Provided specific recommendations on safer alternatives of these foods. Strongly encouraged compliance of this diet.   Discussed importance of protein intake at each meal and snack. Provided examples of how to maximize protein intake throughout the day. Discussed need for fluid restriction with dialysis, importance of minimizing weight gain between HD treatments, and renal-friendly beverage options.  Encouraged pt to discuss specific diet questions/concerns with RD at HD outpatient facility. Teach back method used.  Expect fair compliance.  Body mass index is 24.09 kg/(m^2). BMI is WNL.  Current diet order is Renal/CHO Modified, patient is consuming approximately 100% of meals at this time. Labs and medications reviewed. No further nutrition interventions warranted at this time. RD contact information provided. If additional nutrition issues arise, please re-consult RD.  [place], [place], [place] Pager [phone] After Hours Pager

## 2014-06-19 NOTE — Progress Notes (Signed)
Patient Profile: 64 yo male w/ hx of severe CKD (not yet on HD), PAD with prior interventions, non-obstructive CAD, DM2, HTN, and prior history of chest pain. H was admitted on 03/08 w/ chest pain. Plans for dialysis.  Subjective: Some chest pain last pm, after dialysis  Objective: Vital signs in last 24 hours: Temp:  [97.6 F (36.4 C)-98.2 F (36.8 C)] 98 F (36.7 C) (03/11 0653) Pulse Rate:  [71-86] 76 (03/11 0653) Resp:  [14-18] 16 (03/11 0653) BP: (133-182)/(68-95) 152/72 mmHg (03/11 0653) SpO2:  [95 %-98 %] 96 % (03/11 0653) Weight:  [177 lb 9.6 oz (80.559 kg)-182 lb 15.7 oz (83 kg)] 177 lb 9.6 oz (80.559 kg) (03/11 0653) Weight change: -14.7 oz (-0.416 kg) Last BM Date: 06/18/14 Intake/Output from previous day: -2255 (since admit -4879) wt 177 down from 187. 03/10 0701 - 03/11 0700 In: 820 [P.O.:820] Out: 3075 [Urine:2075] Intake/Output this shift:    PE: General:Pleasant affect, NAD Skin:Warm and dry, brisk capillary refill HEENT:normocephalic, sclera clear, mucus membranes moist Heart:S1S2 RRR without murmur, gallup, rub or click Lungs:clear without rales, rhonchi, or wheezes [id number], non tender, + BS, do not palpate liver spleen or masses Ext:2+ lower ext edema, 2+ pedal pulses, 2+ radial pulses Neuro:alert and oriented X 3, MAE, follows commands, + facial symmetry  teleSR  Lab Results:  Recent Labs  06/17/14 0515 06/18/14 0559  WBC 4.6 4.7  HGB 10.7* 10.5*  HCT 31.9* 31.3*  PLT 246 216   BMET  Recent Labs  06/17/14 0515 06/18/14 0559  NA 139 135  K 3.8 3.9  CL 106 105  CO2 22 19  GLUCOSE 94 117*  BUN 77* 81*  CREATININE 11.30* 11.61*  CALCIUM 7.4* 6.7*    Recent Labs  06/16/14 1334  TROPONINI 0.03    Lab Results  Component Value Date   CHOL 200 12/12/2013   HDL 48 12/12/2013   LDLCALC 134* 12/12/2013   TRIG 90 12/12/2013   CHOLHDL 4.2 12/12/2013   Lab Results  Component Value Date   HGBA1C 7.1* 12/15/2013       Lab Results  Component Value Date   TSH 1.240 12/05/2013     Studies/Results: No results found.  Medications: I have reviewed the patient's current medications. Scheduled Meds: . amLODipine  10 mg Oral Daily  . aspirin EC  81 mg Oral Daily  . atorvastatin  40 mg Oral q1800  . calcitRIOL  0.25 mcg Oral Daily  . calcium acetate  1,334 mg Oral TID WC  . carvedilol  25 mg Oral BID WC  . cloNIDine  0.3 mg Oral BID  . clopidogrel  75 mg Oral Q breakfast  . cyclobenzaprine  10 mg Oral QHS  . furosemide  120 mg Intravenous Q8H  . heparin  5,000 Units Subcutaneous 3 times per day  . insulin aspart  0-9 Units Subcutaneous TID WC  . isosorbide mononitrate  30 mg Oral Daily  . pantoprazole  40 mg Oral Daily  . sodium chloride  3 mL Intravenous Q12H   Continuous Infusions:  PRN Meds:.acetaminophen **OR** acetaminophen, HYDROcodone-acetaminophen, morphine injection, nitroGLYCERIN, ondansetron **OR** ondansetron (ZOFRAN) IV  Assessment/Plan: Principal Problem:  Acute on chronic renal failure-Cr 11 yesterday, none for today - renal following - had dialysis yesterday.  Active Problems:  Hyperlipidemia LDL goal <70 on lipitor 40, LDL 134 in 12/2013   Hypertension mostly controlled, up this am 152/72 yesterday up to 178/88 Continue amlodipine, BB, clonidine, and  lasix- 120 mg every 8 hours.had been on bidil briefly but was not taking prior to admit.    -per IM and nephrology    PVD (peripheral vascular disease) s/p PCI L-SFA -Continue clopidogrel and asprin-   Chest pain- non obstructive CAD with cath 08/2010 LAD 25-30% stenosis, 1st diag small with 80% stenosis, AV grove 25% lesions, RCA large with with 25% stenosis. Normal stress 12/2013. One troponin minimally elevated at 0.04, may be due to renal failure -  Echo with EF 55-60% and normal wall motion, G2DD. If continued chest pain, cath option if dialysis is started.   Added IMDUR will increase to 60 mg   Anemia due to  chronic disease.   Renal failure (ARF), acute on chronic- dialysis yesterday wt down 10 pounds since yesterday.  Volume overload- improved -4879 since admit    LOS: 3 days   Time spent with pt. :15 minutes. [facility] R  Nurse Practitioner Certified Pager [phone] or after 5pm and on weekends call [phone] 06/19/2014, 8:08 AM  Personally seen and examined. Agree with above. Had some chest pain yesterday after dialysis mild left sided pressure. Now feels fine. I discussed with him moving forward with heart cath on Monday. He would like to see if he feels better today and tomorrow. If no further pain, OK with outpatient follow up. If pain again, encourage cath on Monday.   RRR, CTAB, 1+ LE edema improved.   Jon Furbish, MD

## 2014-06-20 DIAGNOSIS — N186 End stage renal disease: Secondary | ICD-10-CM

## 2014-06-20 DIAGNOSIS — R072 Precordial pain: Secondary | ICD-10-CM

## 2014-06-20 LAB — CBC
HCT: 30.8 % — ABNORMAL LOW (ref 39.0–52.0)
Hemoglobin: 10.2 g/dL — ABNORMAL LOW (ref 13.0–17.0)
MCH: 27.8 pg (ref 26.0–34.0)
MCHC: 33.1 g/dL (ref 30.0–36.0)
MCV: 83.9 fL (ref 78.0–100.0)
Platelets: 237 10*3/uL (ref 150–400)
RBC: 3.67 MIL/uL — ABNORMAL LOW (ref 4.22–5.81)
RDW: 15.2 % (ref 11.5–15.5)
WBC: 5.2 10*3/uL (ref 4.0–10.5)

## 2014-06-20 LAB — BASIC METABOLIC PANEL
Anion gap: 12 (ref 5–15)
BUN: 47 mg/dL — ABNORMAL HIGH (ref 6–23)
CO2: 24 mmol/L (ref 19–32)
Calcium: 8 mg/dL — ABNORMAL LOW (ref 8.4–10.5)
Chloride: 99 mmol/L (ref 96–112)
Creatinine, Ser: 7.66 mg/dL — ABNORMAL HIGH (ref 0.50–1.35)
GFR calc Af Amer: 8 mL/min — ABNORMAL LOW (ref >90)
GFR calc non Af Amer: 7 mL/min — ABNORMAL LOW (ref >90)
Glucose, Bld: 112 mg/dL — ABNORMAL HIGH (ref 70–99)
Potassium: 4.1 mmol/L (ref 3.5–5.1)
Sodium: 135 mmol/L (ref 135–145)

## 2014-06-20 LAB — GLUCOSE, CAPILLARY
Glucose-Capillary: 106 mg/dL — ABNORMAL HIGH (ref 70–99)
Glucose-Capillary: 154 mg/dL — ABNORMAL HIGH (ref 70–99)
Glucose-Capillary: 145 mg/dL — ABNORMAL HIGH (ref 70–99)
Glucose-Capillary: 123 mg/dL — ABNORMAL HIGH (ref 70–99)

## 2014-06-20 LAB — HEPATITIS B SURFACE ANTIBODY,QUALITATIVE: Hep B S Ab: NONREACTIVE

## 2014-06-20 LAB — HEPATITIS B CORE ANTIBODY, TOTAL: Hep B Core Total Ab: NEGATIVE

## 2014-06-20 NOTE — Progress Notes (Signed)
Patient ID: Jon Anderson, male   DOB: 1950-07-23, 64 y.o.   MRN: [id number]  [facility] Progress Note    Assessment/ Plan:   1. End stage renal disease: With progressive chronic kidney disease and admitted with relative refractoriness to diuretic therapy. Hemodialysis started yesterday and second treatment due today. Process initiated for outpatient dialysis unit placement. Successfully using his left brachiocephalic fistula. 2. Chest pain: Still having intermittent chest pain-ongoing cardiology evaluation/workup. 3. Hypertension: Blood pressures marginally elevated- improving with ultrafiltration and hemodialysis. 4. Anemia: Iron stores low, continue intravenous iron for repletion. Will give a single dose of Aranesp 5. Metabolic bone disease: Resume calcitriol, started on calcium acetate for phosphorus binding in-monitor phosphorus trend.   Anticipated DC later today after HD to start TTS at [facility] on tuesday Subjective:   Reports to be feeling better and pleased with improving leg edema   Objective:   BP 159/57 mmHg  Pulse 78  Temp(Src) 98 F (36.7 C) (Oral)  Resp 18  Ht 5\' 11"  (1.803 m)  Wt 79.4 kg (175 lb 0.7 oz)  BMI 24.42 kg/m2  SpO2 100%  Intake/Output Summary (Last 24 hours) at 06/20/14 0920 Last data filed at 06/20/14 [id number]  Gross per 24 hour  Intake    954 ml  Output   2200 ml  Net  -1246 ml   Weight change: -2.2 kg (-4 lb 13.6 oz)  Physical Exam: [id number] on HD [id number] RRR, normal s1 and s2 Resp:CTA bilaterally, no rales/rhonchi [id number], flat, NT Ext:2+ LE edema  Imaging: No results found.  Labs: BMET  Recent Labs Lab 06/16/14 0033 06/16/14 0745 06/16/14 1334 06/17/14 0515 06/18/14 0559 06/19/14 1200 06/20/14 0420  NA 138 140  --  139 135 136 135  K 4.2 3.9  --  3.8 3.9 3.8 4.1  CL 107 109  --  106 105 101 99  CO2 16* 18*  --  22 19 24 24   GLUCOSE 113* 101*  --  94 117* 112* 112*  BUN 70* 71*  --  77* 81* 63* 47*   CREATININE 11.18* 11.35*  --  11.30* 11.61* 9.67* 7.66*  CALCIUM 6.9* 6.9*  --  7.4* 6.7* 7.5* 8.0*  PHOS  --   --  7.1*  --   --  5.4*  --    CBC  Recent Labs Lab 06/16/14 0033 06/16/14 0745 06/17/14 0515 06/18/14 0559 06/19/14 1200 06/20/14 0800  WBC 10.1 7.1 4.6 4.7 4.7 5.2  NEUTROABS 6.4 4.2  --   --   --   --   HGB 10.8* 10.5* 10.7* 10.5* 10.2* 10.2*  HCT 31.9* 31.9* 31.9* 31.3* 30.1* 30.8*  MCV 83.1 83.5 83.5 84.4 83.1 83.9  PLT 279 241 246 216 253 237    Medications:    . amLODipine  10 mg Oral Daily  . aspirin EC  81 mg Oral Daily  . atorvastatin  40 mg Oral q1800  . calcitRIOL  0.25 mcg Oral Daily  . calcium acetate  1,334 mg Oral TID WC  . carvedilol  25 mg Oral BID WC  . cloNIDine  0.3 mg Oral BID  . clopidogrel  75 mg Oral Q breakfast  . cyclobenzaprine  10 mg Oral QHS  . furosemide  120 mg Intravenous Q8H  . heparin  5,000 Units Subcutaneous 3 times per day  . insulin aspart  0-9 Units Subcutaneous TID WC  . isosorbide mononitrate  30 mg Oral Daily  . pantoprazole  40 mg Oral  Daily  . sodium chloride  3 mL Intravenous Q12H   Elmarie Shiley, MD 06/20/2014, 9:20 AM

## 2014-06-20 NOTE — Procedures (Signed)
Patient seen on Hemodialysis. QB 300, UF goal 3.5L Treatment adjusted as needed.  Elmarie Shiley MD [facility]. Office # [phone] Pager # [phone] 9:18 AM

## 2014-06-20 NOTE — Progress Notes (Signed)
BP 94/52; HR 83.  Pt asymptomatic and resting.  MD aware.  No further orders at this time.

## 2014-06-20 NOTE — Progress Notes (Addendum)
[facility] PROGRESS NOTE  Jon Anderson [id number] DOB: 03/27/1951 DOA: 06/16/2014 PCP: [name], MD  Assessment/Plan: 1. ESRD with Volume overload -AVF placed 12/10, started HD 3/10 and tolerating well -Renal Diet, follow I/Os -Diet education -off lasix, outpatient HD set up to start at [facility] on tuesday  2. Chest pain -intermittent, cards following, plan for Cath on Monday -negative stress test 9/15, Cards following -Continue ASA/Plavix, Coreg and statin  3. Hypertensive Urgency -improved, continue current meds -will need to cut down  4. PAD s/p PCI to L SFA -continue Plavix  5. Anemia of chronic disease -s/p IV Iron  DVt proph: Hep SQ  Ambulate, Pt/OT  Code Status: Full  Family Communication: none at bedside Disposition Plan: pending cath on Monday   Consultants:  Renal  cards   HPI/Subjective: Intermittent chest pressure, just had another episode of chest pain  Objective: Filed Vitals:   06/20/14 1126  BP: 116/58  Pulse: 85  Temp:   Resp:     Intake/Output Summary (Last 24 hours) at 06/20/14 1154 Last data filed at 06/20/14 1045  Gross per 24 hour  Intake    924 ml  Output   5200 ml  Net  -4276 ml   Filed Weights   06/20/14 0607 06/20/14 0650 06/20/14 1045  Weight: 78.654 kg (173 lb 6.4 oz) 79.4 kg (175 lb 0.7 oz) 76.5 kg (168 lb 10.4 oz)    Exam:   General: AAOx3, tired appearing, resting in bed  Cardiovascular: S1S2/RRR  Respiratory: CTAB  Abdomen: soft, NT, BS present, no organomegaly  Ext: no edema, AVF with bruit  Data Reviewed: Basic Metabolic Panel:  Recent Labs Lab 06/16/14 0745 06/16/14 1334 06/17/14 0515 06/18/14 0559 06/19/14 1200 06/20/14 0420  NA 140  --  139 135 136 135  K 3.9  --  3.8 3.9 3.8 4.1  CL 109  --  106 105 101 99  CO2 18*  --  22 19 24 24   GLUCOSE 101*  --  94 117* 112* 112*  BUN 71*  --  77* 81* 63* 47*  CREATININE 11.35*  --  11.30* 11.61* 9.67* 7.66*  CALCIUM 6.9*  --  7.4*  6.7* 7.5* 8.0*  PHOS  --  7.1*  --   --  5.4*  --    Liver Function Tests:  Recent Labs Lab 06/16/14 0033 06/16/14 0745 06/19/14 1200  AST 32 31  --   ALT 45 44  --   ALKPHOS 106 104  --   BILITOT 0.3 0.5  --   PROT 7.2 6.6  --   ALBUMIN 3.5 3.2* 2.9*   No results for input(s): LIPASE, AMYLASE in the last 168 hours. No results for input(s): AMMONIA in the last 168 hours. CBC:  Recent Labs Lab 06/16/14 0033 06/16/14 0745 06/17/14 0515 06/18/14 0559 06/19/14 1200 06/20/14 0800  WBC 10.1 7.1 4.6 4.7 4.7 5.2  NEUTROABS 6.4 4.2  --   --   --   --   HGB 10.8* 10.5* 10.7* 10.5* 10.2* 10.2*  HCT 31.9* 31.9* 31.9* 31.3* 30.1* 30.8*  MCV 83.1 83.5 83.5 84.4 83.1 83.9  PLT 279 241 246 216 253 237   Cardiac Enzymes:  Recent Labs Lab 06/16/14 0745 06/16/14 1334  TROPONINI 0.04* 0.03   BNP (last 3 results)  Recent Labs  06/16/14 0033  BNP 432.2*    ProBNP (last 3 results)  Recent Labs  03/25/14 0945  PROBNP 4548.0*    CBG:  Recent Labs Lab  06/18/14 2144 06/19/14 0706 06/19/14 1715 06/19/14 2107 06/20/14 0610  GLUCAP 215* 86 184* 119* 106*    No results found for this or any previous visit (from the past 240 hour(s)).   Studies: No results found.  Scheduled Meds: . amLODipine  10 mg Oral Daily  . aspirin EC  81 mg Oral Daily  . atorvastatin  40 mg Oral q1800  . calcium acetate  1,334 mg Oral TID WC  . carvedilol  25 mg Oral BID WC  . cloNIDine  0.3 mg Oral BID  . clopidogrel  75 mg Oral Q breakfast  . cyclobenzaprine  10 mg Oral QHS  . heparin  5,000 Units Subcutaneous 3 times per day  . insulin aspart  0-9 Units Subcutaneous TID WC  . isosorbide mononitrate  30 mg Oral Daily  . pantoprazole  40 mg Oral Daily  . sodium chloride  3 mL Intravenous Q12H   Continuous Infusions:  Antibiotics Given (last 72 hours)    None      Principal Problem:   Acute on chronic renal failure Active Problems:   Hyperlipidemia LDL goal <70    Hypertension   Peripheral arterial disease   PVD (peripheral vascular disease)   Chest pain   Renal failure (ARF), acute on chronic    Time spent: 23min    [facility] Pager [phone]. If 7PM-7AM, please contact night-coverage at [email or web address], password [facility] 06/20/2014, 11:54 AM  LOS: 4 days

## 2014-06-20 NOTE — Progress Notes (Signed)
Primary cardiologist: Dr. Carlyle Dolly  Seen for followup: Chest pain  Subjective:    Had recurrent mild chest pressure this AM after starting dialysis - feels better now.  Objective:   Temp:  [98 F (36.7 C)-98.7 F (37.1 C)] 98 F (36.7 C) (03/12 0650) Pulse Rate:  [67-82] 78 (03/12 0800) Resp:  [16-18] 18 (03/12 0700) BP: (121-161)/(57-103) 159/57 mmHg (03/12 0800) SpO2:  [94 %-100 %] 100 % (03/12 0650) Weight:  [172 lb 9.9 oz (78.3 kg)-178 lb 2.1 oz (80.8 kg)] 175 lb 0.7 oz (79.4 kg) (03/12 0650) Last BM Date: 06/19/14  Filed Weights   06/19/14 1451 06/20/14 0607 06/20/14 0650  Weight: 172 lb 9.9 oz (78.3 kg) 173 lb 6.4 oz (78.654 kg) 175 lb 0.7 oz (79.4 kg)    Intake/Output Summary (Last 24 hours) at 06/20/14 0842 Last data filed at 06/20/14 [id number]  Gross per 24 hour  Intake   1174 ml  Output   2400 ml  Net  -1226 ml    Telemetry: Sinus rhythm in dialysis.  Exam:  General: Appears comfortable.  Lungs: Clear, nonlabored.  Cardiac: RRR, no gallop.  Extremities: No pitting edema.   Lab Results:  Basic Metabolic Panel:  Recent Labs Lab 06/18/14 0559 06/19/14 1200 06/20/14 0420  NA 135 136 135  K 3.9 3.8 4.1  CL 105 101 99  CO2 19 24 24   GLUCOSE 117* 112* 112*  BUN 81* 63* 47*  CREATININE 11.61* 9.67* 7.66*  CALCIUM 6.7* 7.5* 8.0*    CBC:  Recent Labs Lab 06/18/14 0559 06/19/14 1200 06/20/14 0800  WBC 4.7 4.7 5.2  HGB 10.5* 10.2* 10.2*  HCT 31.3* 30.1* 30.8*  MCV 84.4 83.1 83.9  PLT 216 253 237    Cardiac Enzymes:  Recent Labs Lab 06/16/14 0745 06/16/14 1334  TROPONINI 0.04* 0.03    BNP:  Recent Labs  03/25/14 0945  PROBNP 4548.0*    Echocardiogram 06/17/2014: Study Conclusions  - Left ventricle: The cavity size was normal. Systolic function was normal. The estimated ejection fraction was in the range of 55% to 60%. Wall motion was normal; there were no regional wall motion abnormalities. Features are  consistent with a pseudonormal left ventricular filling pattern, with concomitant abnormal relaxation and increased filling pressure (grade 2 diastolic dysfunction). - Aortic valve: Trileaflet; normal thickness, mildly calcified leaflets. - Mitral valve: There was mild regurgitation. - Left atrium: The atrium was mildly dilated.    Medications:   Scheduled Medications: . amLODipine  10 mg Oral Daily  . aspirin EC  81 mg Oral Daily  . atorvastatin  40 mg Oral q1800  . calcitRIOL  0.25 mcg Oral Daily  . calcium acetate  1,334 mg Oral TID WC  . carvedilol  25 mg Oral BID WC  . cloNIDine  0.3 mg Oral BID  . clopidogrel  75 mg Oral Q breakfast  . cyclobenzaprine  10 mg Oral QHS  . furosemide  120 mg Intravenous Q8H  . heparin  5,000 Units Subcutaneous 3 times per day  . insulin aspart  0-9 Units Subcutaneous TID WC  . isosorbide mononitrate  30 mg Oral Daily  . pantoprazole  40 mg Oral Daily  . sodium chloride  3 mL Intravenous Q12H      PRN Medications:  acetaminophen **OR** acetaminophen, HYDROcodone-acetaminophen, morphine injection, nitroGLYCERIN, ondansetron **OR** ondansetron (ZOFRAN) IV   Assessment:   1. Recurrent chest pain with history of nonobstructive CAD as of 2012 (negative Cardiolite 12/2014). Cardiac catheterization  discussed with patient per Dr. Marlou Porch and he is in agreement. Had more chest pain this AM, but current stable. Peak troponin I only 0.04.   2. ESRD, newly on hemodialysis.  3. PAD s/p prior PCI of left SFA.  4. Essential hypertension.   Plan/Discussion:    Will place on cardiac catheterization schedule for Monday. Continue ASA, Lipitor, Norvasc, Coreg, Plavix, and Imdur. Check PT-INR. Orders completed. Will follow.   Satira Sark, M.D., F.A.C.C.

## 2014-06-21 LAB — TROPONIN I
Troponin I: 0.03 ng/mL (ref <0.031)
Troponin I: 0.03 ng/mL (ref <0.031)

## 2014-06-21 LAB — GLUCOSE, CAPILLARY
Glucose-Capillary: 150 mg/dL — ABNORMAL HIGH (ref 70–99)
Glucose-Capillary: 117 mg/dL — ABNORMAL HIGH (ref 70–99)
Glucose-Capillary: 162 mg/dL — ABNORMAL HIGH (ref 70–99)
Glucose-Capillary: 134 mg/dL — ABNORMAL HIGH (ref 70–99)
Glucose-Capillary: 159 mg/dL — ABNORMAL HIGH (ref 70–99)

## 2014-06-21 LAB — MRSA PCR SCREENING: MRSA by PCR: NEGATIVE

## 2014-06-21 MED ORDER — CLONIDINE HCL 0.1 MG PO TABS
0.1000 mg | ORAL_TABLET | Freq: Two times a day (BID) | ORAL | Status: DC
  Administered 2014-06-21: 0.1 mg via ORAL
  Filled 2014-06-21 (×2): qty 1

## 2014-06-21 MED ORDER — SODIUM CHLORIDE 0.9 % IV SOLN
INTRAVENOUS | Status: DC
  Administered 2014-06-22: 04:00:00 via INTRAVENOUS

## 2014-06-21 MED ORDER — SODIUM CHLORIDE 0.9 % IJ SOLN: 3.0000 mL | INTRAMUSCULAR | Status: DC | PRN

## 2014-06-21 MED ORDER — SODIUM CHLORIDE 0.9 % IV BOLUS (SEPSIS): 500.0000 mL | Freq: Once | INTRAVENOUS | Status: DC

## 2014-06-21 MED ORDER — SODIUM CHLORIDE 0.9 % IV BOLUS (SEPSIS)
250.0000 mL | Freq: Once | INTRAVENOUS | Status: AC
  Administered 2014-06-21: 250 mL via INTRAVENOUS

## 2014-06-21 MED ORDER — SODIUM CHLORIDE 0.9 % IV SOLN: 250.0000 mL | INTRAVENOUS | Status: DC | PRN

## 2014-06-21 MED ORDER — SODIUM CHLORIDE 0.9 % IJ SOLN
3.0000 mL | Freq: Two times a day (BID) | INTRAMUSCULAR | Status: DC
  Administered 2014-06-21: 3 mL via INTRAVENOUS

## 2014-06-21 NOTE — Progress Notes (Signed)
[facility] PROGRESS NOTE  Jon Anderson [id number] DOB: 1950/09/21 DOA: 06/16/2014 PCP: [name], MD  Assessment/Plan: 1. ESRD with Volume overload -AVF placed 12/10, started HD 3/10 and tolerating well -Renal Diet, follow I/Os -Diet education -off lasix, outpatient HD set up to start at [facility] on tuesday  2. Chest pain -intermittent, cards following, plan for Cath on Monday -negative stress test 9/15, Cards following -Continue ASA/Plavix, Coreg and statin -cycle cardiac enzymes again  3. Hypertensive Urgency -improved, now BP soft and Hypotensive in 70-80 range -i will stop norvasc, imdur, clonidine   4. PAD s/p PCI to L SFA -continue Plavix  5. Anemia of chronic disease -s/p IV Iron  DVt proph: Hep SQ  Ambulate, Pt/OT  Code Status: Full  Family Communication: none at bedside Disposition Plan: pending cath on Monday   Consultants:  Renal  cards   HPI/Subjective: Intermittent chest pressue  Objective: Filed Vitals:   06/21/14 1322  BP: 98/52  Pulse: 77  Temp:   Resp:     Intake/Output Summary (Last 24 hours) at 06/21/14 1401 Last data filed at 06/21/14 1116  Gross per 24 hour  Intake    903 ml  Output      0 ml  Net    903 ml   Filed Weights   06/20/14 0650 06/20/14 1045 06/21/14 0530  Weight: 79.4 kg (175 lb 0.7 oz) 76.5 kg (168 lb 10.4 oz) 77.1 kg (169 lb 15.6 oz)    Exam:   General: AAOx3, tired appearing, resting in bed  Cardiovascular: S1S2/RRR  Respiratory: CTAB  Abdomen: soft, NT, BS present, no organomegaly  Ext: no edema, AVF with bruit  Data Reviewed: Basic Metabolic Panel:  Recent Labs Lab 06/16/14 0745 06/16/14 1334 06/17/14 0515 06/18/14 0559 06/19/14 1200 06/20/14 0420  NA 140  --  139 135 136 135  K 3.9  --  3.8 3.9 3.8 4.1  CL 109  --  106 105 101 99  CO2 18*  --  22 19 24 24   GLUCOSE 101*  --  94 117* 112* 112*  BUN 71*  --  77* 81* 63* 47*  CREATININE 11.35*  --  11.30* 11.61* 9.67* 7.66*   CALCIUM 6.9*  --  7.4* 6.7* 7.5* 8.0*  PHOS  --  7.1*  --   --  5.4*  --    Liver Function Tests:  Recent Labs Lab 06/16/14 0033 06/16/14 0745 06/19/14 1200  AST 32 31  --   ALT 45 44  --   ALKPHOS 106 104  --   BILITOT 0.3 0.5  --   PROT 7.2 6.6  --   ALBUMIN 3.5 3.2* 2.9*   No results for input(s): LIPASE, AMYLASE in the last 168 hours. No results for input(s): AMMONIA in the last 168 hours. CBC:  Recent Labs Lab 06/16/14 0033 06/16/14 0745 06/17/14 0515 06/18/14 0559 06/19/14 1200 06/20/14 0800  WBC 10.1 7.1 4.6 4.7 4.7 5.2  NEUTROABS 6.4 4.2  --   --   --   --   HGB 10.8* 10.5* 10.7* 10.5* 10.2* 10.2*  HCT 31.9* 31.9* 31.9* 31.3* 30.1* 30.8*  MCV 83.1 83.5 83.5 84.4 83.1 83.9  PLT 279 241 246 216 253 237   Cardiac Enzymes:  Recent Labs Lab 06/16/14 0745 06/16/14 1334  TROPONINI 0.04* 0.03   BNP (last 3 results)  Recent Labs  06/16/14 0033  BNP 432.2*    ProBNP (last 3 results)  Recent Labs  03/25/14 0945  PROBNP  4548.0*    CBG:  Recent Labs Lab 06/20/14 1640 06/20/14 2136 06/21/14 0524 06/21/14 1202 06/21/14 1313  GLUCAP 145* 123* 150* 117* 162*    Recent Results (from the past 240 hour(s))  MRSA PCR Screening     Status: None   Collection Time: 06/20/14  9:21 PM  Result Value Ref Range Status   MRSA by PCR NEGATIVE NEGATIVE Final    Comment:        The GeneXpert MRSA Assay (FDA approved for NASAL specimens only), is one component of a comprehensive MRSA colonization surveillance program. It is not intended to diagnose MRSA infection nor to guide or monitor treatment for MRSA infections.      Studies: No results found.  Scheduled Meds: . aspirin EC  81 mg Oral Daily  . atorvastatin  40 mg Oral q1800  . calcium acetate  1,334 mg Oral TID WC  . carvedilol  25 mg Oral BID WC  . clopidogrel  75 mg Oral Q breakfast  . cyclobenzaprine  10 mg Oral QHS  . heparin  5,000 Units Subcutaneous 3 times per day  . insulin  aspart  0-9 Units Subcutaneous TID WC  . pantoprazole  40 mg Oral Daily  . sodium chloride  250 mL Intravenous Once  . sodium chloride  3 mL Intravenous Q12H   Continuous Infusions:  Antibiotics Given (last 72 hours)    None      Principal Problem:   Acute on chronic renal failure Active Problems:   Hyperlipidemia LDL goal <70   Hypertension   Peripheral arterial disease   PVD (peripheral vascular disease)   Chest pain   Renal failure (ARF), acute on chronic    Time spent: 23min    [facility] Pager [phone]. If 7PM-7AM, please contact night-coverage at [email or web address], password [facility] 06/21/2014, 2:01 PM  LOS: 5 days

## 2014-06-21 NOTE — Progress Notes (Signed)
1320,  BP 72/44, HR 84;  1322 BP 98/52; HR 77.  Pt complaining of Left sided CP 5/10.  Paged Dr. Broadus John; paged RR. Pt placed on 2L [place] O2. Ekg performed and placed in chart with the following unconfirmed results: Ventricular rate 75 bpm PR interval 156 ms QRS duration 102 ms QT/QTc 404/451 ms P-R-T axes 17-8-46. Dr. Broadus John arrived and ordered a NS 285mL bolus to be admin.  Pt's pain has now declined to 2/5.   Pt is now in no distress.

## 2014-06-21 NOTE — Progress Notes (Signed)
Patient ID: Jon Anderson, male   DOB: [birth year], 64 y.o.   MRN: [id number]   [facility] Progress Note   Assessment/ Plan:   1. End stage renal disease: With progressive chronic kidney disease and admitted with relative refractoriness to diuretic therapy. Status post his third dialysis treatment yesterday (anticipated discharge over the weekend but scheduled to have coronary angiogram tomorrow). He is dialyzing via his left brachiocephalic fistula with improving volume status. Has been accepted to [facility] on a TTS first shift schedule 2. Chest pain: Still having intermittent chest pain-plans noted for cardiac catheterization tomorrow for further risk stratification. 3. Hypertension: Blood pressures now on the lower side with ultrafiltration at hemodialysis-will adjust antihypertensive therapy to prevent hypotensive complications. 4. Anemia: Status post intravenous iron therapy and started on ESA 5. Metabolic bone disease: Resumed calcitriol, started on calcium acetate for phosphorus binding -monitor phosphorus trend.   Subjective:   Reports to be feeling better-still having intermittent chest pain but shortness of breath resolved    Objective:   BP 105/54 mmHg  Pulse 72  Temp(Src) 98.4 F (36.9 C) (Oral)  Resp 18  Ht 5\' 11"  (1.803 m)  Wt 77.1 kg (169 lb 15.6 oz)  BMI 23.72 kg/m2  SpO2 95%  Physical Exam: Gen: Comfortably resting in bed CVS: Pulse regular in rate and rhythm, S1 and S2 normal Resp: Decreased breath sounds over bases-poor inspiratory effort. Clear otherwise Abd: Soft, flat, nontender Ext: Trace to 1+ edema over the pretibial surfaces  Labs: BMET  Recent Labs Lab 06/16/14 0033 06/16/14 0745 06/16/14 1334 06/17/14 0515 06/18/14 0559 06/19/14 1200 06/20/14 0420  NA 138 140  --  139 135 136 135  K 4.2 3.9  --  3.8 3.9 3.8 4.1  CL 107 109  --  106 105 101 99  CO2 16* 18*  --  22 19 24 24   GLUCOSE 113* 101*  --  94 117* 112* 112*  BUN 70* 71*   --  77* 81* 63* 47*  CREATININE 11.18* 11.35*  --  11.30* 11.61* 9.67* 7.66*  CALCIUM 6.9* 6.9*  --  7.4* 6.7* 7.5* 8.0*  PHOS  --   --  7.1*  --   --  5.4*  --    CBC  Recent Labs Lab 06/16/14 0033 06/16/14 0745 06/17/14 0515 06/18/14 0559 06/19/14 1200 06/20/14 0800  WBC 10.1 7.1 4.6 4.7 4.7 5.2  NEUTROABS 6.4 4.2  --   --   --   --   HGB 10.8* 10.5* 10.7* 10.5* 10.2* 10.2*  HCT 31.9* 31.9* 31.9* 31.3* 30.1* 30.8*  MCV 83.1 83.5 83.5 84.4 83.1 83.9  PLT 279 241 246 216 253 237   Medications:    . amLODipine  10 mg Oral Daily  . aspirin EC  81 mg Oral Daily  . atorvastatin  40 mg Oral q1800  . calcium acetate  1,334 mg Oral TID WC  . carvedilol  25 mg Oral BID WC  . cloNIDine  0.3 mg Oral BID  . clopidogrel  75 mg Oral Q breakfast  . cyclobenzaprine  10 mg Oral QHS  . heparin  5,000 Units Subcutaneous 3 times per day  . insulin aspart  0-9 Units Subcutaneous TID WC  . isosorbide mononitrate  30 mg Oral Daily  . pantoprazole  40 mg Oral Daily  . sodium chloride  3 mL Intravenous Q12H   Elmarie Shiley, MD 06/21/2014, 9:18 AM

## 2014-06-21 NOTE — Progress Notes (Signed)
Patient ID: Jon Anderson, male   DOB: [birth year], 64 y.o.   MRN: [id number]    Primary cardiologist: Dr. Carlyle Dolly  Diagnosis:  Chest Pain   Subjective:    No chest pain   Objective:   Temp:  [97.5 F (36.4 C)-98.4 F (36.9 C)] 98.1 F (36.7 C) (03/13 0900) Pulse Rate:  [65-85] 68 (03/13 0900) Resp:  [16-20] 20 (03/13 0900) BP: (94-140)/(41-73) 116/55 mmHg (03/13 0900) SpO2:  [95 %-100 %] 98 % (03/13 0900) Weight:  [76.5 kg (168 lb 10.4 oz)-77.1 kg (169 lb 15.6 oz)] 77.1 kg (169 lb 15.6 oz) (03/13 0530) Last BM Date: 06/20/14  Filed Weights   06/20/14 0650 06/20/14 1045 06/21/14 0530  Weight: 79.4 kg (175 lb 0.7 oz) 76.5 kg (168 lb 10.4 oz) 77.1 kg (169 lb 15.6 oz)    Intake/Output Summary (Last 24 hours) at 06/21/14 0949 Last data filed at 06/21/14 0900  Gross per 24 hour  Intake    783 ml  Output   3000 ml  Net  -2217 ml    Telemetry: Sinus rhythm no arrhythmia 06/21/2014   Exam:  Affect appropriate Healthy:  appears stated age HEENT: normal Neck supple with no adenopathy JVP normal no bruits no thyromegaly Lungs clear with no wheezing and good diaphragmatic motion Heart:  S1/S2 no murmur, no rub, gallop or click PMI normal Abdomen: benighn, BS positve, no tenderness, no AAA no bruit.  No HSM or HJR Distal pulses intact with no bruits No edema Neuro non-focal Skin warm and dry No muscular weakness Fistula in LUE with good thrill   Lab Results:  Basic Metabolic Panel:  Recent Labs Lab 06/18/14 0559 06/19/14 1200 06/20/14 0420  NA 135 136 135  K 3.9 3.8 4.1  CL 105 101 99  CO2 19 24 24   GLUCOSE 117* 112* 112*  BUN 81* 63* 47*  CREATININE 11.61* 9.67* 7.66*  CALCIUM 6.7* 7.5* 8.0*    CBC:  Recent Labs Lab 06/18/14 0559 06/19/14 1200 06/20/14 0800  WBC 4.7 4.7 5.2  HGB 10.5* 10.2* 10.2*  HCT 31.3* 30.1* 30.8*  MCV 84.4 83.1 83.9  PLT 216 253 237    Cardiac Enzymes:  Recent Labs Lab 06/16/14 0745 06/16/14 1334    TROPONINI 0.04* 0.03    BNP:  Recent Labs  03/25/14 0945  PROBNP 4548.0*    Echocardiogram 06/17/2014: Study Conclusions  - Left ventricle: The cavity size was normal. Systolic function was normal. The estimated ejection fraction was in the range of 55% to 60%. Wall motion was normal; there were no regional wall motion abnormalities. Features are consistent with a pseudonormal left ventricular filling pattern, with concomitant abnormal relaxation and increased filling pressure (grade 2 diastolic dysfunction). - Aortic valve: Trileaflet; normal thickness, mildly calcified leaflets. - Mitral valve: There was mild regurgitation. - Left atrium: The atrium was mildly dilated.    Medications:   Scheduled Medications: . amLODipine  10 mg Oral Daily  . aspirin EC  81 mg Oral Daily  . atorvastatin  40 mg Oral q1800  . calcium acetate  1,334 mg Oral TID WC  . carvedilol  25 mg Oral BID WC  . cloNIDine  0.1 mg Oral BID  . clopidogrel  75 mg Oral Q breakfast  . cyclobenzaprine  10 mg Oral QHS  . heparin  5,000 Units Subcutaneous 3 times per day  . insulin aspart  0-9 Units Subcutaneous TID WC  . isosorbide mononitrate  30 mg Oral Daily  .  pantoprazole  40 mg Oral Daily  . sodium chloride  3 mL Intravenous Q12H     PRN Medications: acetaminophen **OR** acetaminophen, HYDROcodone-acetaminophen, morphine injection, nitroGLYCERIN, ondansetron **OR** ondansetron (ZOFRAN) IV   Assessment:   1. Recurrent chest pain with history of nonobstructive CAD cath 2012  Normal myovue 2105  . Troponin only .04  Cath tomorrow  NPO after breakfast  2. ESRD, newly on hemodialysis. [place] LUE functioning well  Would use RFA for cath  3. PAD s/p prior PCI of left SFA.  4. Essential hypertension.   Plan/Discussion:   . Continue ASA, Lipitor, Norvasc, Coreg, Plavix, and Imdur. Cath 3/14    Jenkins Rouge

## 2014-06-22 ENCOUNTER — Encounter (HOSPITAL_COMMUNITY)
Admission: EM | Disposition: A | Discharge status: Home / Self Care | Payer: Self-pay | Source: Home / Self Care | Attending: Internal Medicine

## 2014-06-22 HISTORY — PX: LEFT HEART CATHETERIZATION WITH CORONARY ANGIOGRAM: SHX5451

## 2014-06-22 LAB — BASIC METABOLIC PANEL
Anion gap: 11 (ref 5–15)
BUN: 54 mg/dL — ABNORMAL HIGH (ref 6–23)
CO2: 24 mmol/L (ref 19–32)
Calcium: 8.5 mg/dL (ref 8.4–10.5)
Chloride: 98 mmol/L (ref 96–112)
Creatinine, Ser: 9.17 mg/dL — ABNORMAL HIGH (ref 0.50–1.35)
GFR calc Af Amer: 6 mL/min — ABNORMAL LOW (ref >90)
GFR calc non Af Amer: 5 mL/min — ABNORMAL LOW (ref >90)
Glucose, Bld: 96 mg/dL (ref 70–99)
Potassium: 4 mmol/L (ref 3.5–5.1)
Sodium: 133 mmol/L — ABNORMAL LOW (ref 135–145)

## 2014-06-22 LAB — GLUCOSE, CAPILLARY
Glucose-Capillary: 92 mg/dL (ref 70–99)
Glucose-Capillary: 114 mg/dL — ABNORMAL HIGH (ref 70–99)
Glucose-Capillary: 184 mg/dL — ABNORMAL HIGH (ref 70–99)

## 2014-06-22 LAB — CBC
HCT: 33 % — ABNORMAL LOW (ref 39.0–52.0)
Hemoglobin: 10.7 g/dL — ABNORMAL LOW (ref 13.0–17.0)
MCH: 27.4 pg (ref 26.0–34.0)
MCHC: 32.4 g/dL (ref 30.0–36.0)
MCV: 84.6 fL (ref 78.0–100.0)
Platelets: 245 10*3/uL (ref 150–400)
RBC: 3.9 MIL/uL — ABNORMAL LOW (ref 4.22–5.81)
RDW: 15 % (ref 11.5–15.5)
WBC: 6.6 10*3/uL (ref 4.0–10.5)

## 2014-06-22 LAB — TROPONIN I: Troponin I: 0.04 ng/mL — ABNORMAL HIGH (ref <0.031)

## 2014-06-22 LAB — PROTIME-INR
INR: 1.01 (ref 0.00–1.49)
Prothrombin Time: 13.5 seconds (ref 11.6–15.2)

## 2014-06-22 SURGERY — LEFT HEART CATHETERIZATION WITH CORONARY ANGIOGRAM
Anesthesia: LOCAL

## 2014-06-22 MED ORDER — HEPARIN SODIUM (PORCINE) 5000 UNIT/ML IJ SOLN
5000.0000 [IU] | Freq: Three times a day (TID) | INTRAMUSCULAR | Status: DC
  Administered 2014-06-23: 5000 [IU] via SUBCUTANEOUS
  Filled 2014-06-22 (×3): qty 1

## 2014-06-22 MED ORDER — NITROGLYCERIN 1 MG/10 ML FOR IR/CATH LAB
INTRA_ARTERIAL | Status: AC
  Filled 2014-06-22: qty 10

## 2014-06-22 MED ORDER — RENA-VITE PO TABS
1.0000 | ORAL_TABLET | Freq: Every day | ORAL | Status: DC
  Administered 2014-06-22: 1 via ORAL
  Filled 2014-06-22 (×2): qty 1

## 2014-06-22 MED ORDER — CARVEDILOL 12.5 MG PO TABS
12.5000 mg | ORAL_TABLET | Freq: Two times a day (BID) | ORAL | Status: DC
  Administered 2014-06-22: 12.5 mg via ORAL
  Filled 2014-06-22 (×4): qty 1

## 2014-06-22 MED ORDER — LABETALOL HCL 5 MG/ML IV SOLN
10.0000 mg | Freq: Once | INTRAVENOUS | Status: AC
  Administered 2014-06-22: 10 mg via INTRAVENOUS

## 2014-06-22 MED ORDER — MIDAZOLAM HCL 2 MG/2ML IJ SOLN
INTRAMUSCULAR | Status: AC
  Filled 2014-06-22: qty 2

## 2014-06-22 MED ORDER — SODIUM CHLORIDE 0.9 % IV SOLN
INTRAVENOUS | Status: DC
  Administered 2014-06-22: 16:00:00 via INTRAVENOUS

## 2014-06-22 MED ORDER — ONDANSETRON HCL 4 MG/2ML IJ SOLN: 4.0000 mg | Freq: Four times a day (QID) | INTRAMUSCULAR | Status: DC | PRN

## 2014-06-22 MED ORDER — ACETAMINOPHEN 325 MG PO TABS: 650.0000 mg | ORAL_TABLET | ORAL | Status: DC | PRN

## 2014-06-22 MED ORDER — FENTANYL CITRATE 0.05 MG/ML IJ SOLN
INTRAMUSCULAR | Status: AC
  Filled 2014-06-22: qty 2

## 2014-06-22 MED ORDER — HEPARIN (PORCINE) IN NACL 2-0.9 UNIT/ML-% IJ SOLN
INTRAMUSCULAR | Status: AC
  Filled 2014-06-22: qty 1000

## 2014-06-22 MED ORDER — LIDOCAINE HCL (PF) 1 % IJ SOLN
INTRAMUSCULAR | Status: AC
  Filled 2014-06-22: qty 30

## 2014-06-22 NOTE — CV Procedure (Signed)
Jon Anderson is a 64 y.o. male    [id number]  [id number] LOCATION:  FACILITY: [facility]  PHYSICIAN: Troy Sine, MD, [facility] [birth year]   DATE OF PROCEDURE:  06/22/2014    CARDIAC CATHETERIZATION     HISTORY:    Jon Anderson is a 64 y.o. male with end-stage renal disease who was recently started on hemodialysis.  He has a history of hypertension.  He status post PCI of his left superficial femoral artery.  He recently developed recurrent episodes of chest pain.  Remotely, a cardiac catheterization in 2012 showed nonobstructive CAD.  Recent troponin was mildly elevated at 0.04.  He is referred for definitive cardiac catheterization.   PROCEDURE:  Left heart catheterization: coronary angiography, left ventriculography.  The patient was brought to the [facility] cardiac catherization laboratory in the fasting state. He  was premedicated with Versed 1 mg and fentanyl 25 g. His right groin was prepped and shaved in usual sterile fashion. Xylocaine 1% was used for local anesthesia. A 5 French sheath was inserted into the R femoral artery. Diagnostic catheterizatiion was done with 5 [place] FL4, FR4, and pigtail catheters. Left ventriculography was done with 25 cc Omnipaque contrast. Hemostasis was obtained by direct manual compression. The patient tolerated the procedure well.   HEMODYNAMICS:   Central Aorta: 170/71   Left Ventricle: 170/9  ANGIOGRAPHY:  Left main: Angiographically normal long vessel which bifurcated into the LAD and left circumflex vessel.  LAD: Moderate size vessel that gave rise to several small diagonal vessels and to moderate size septal perforating arteries.  The LAD extending to the apex.  It was free of significant disease.  Left circumflex: Angiographically normal vessel which gave rise to one major marginal branch..   Right coronary artery: Large dominant RCA which gave rise to a large PDA vessel, 2 inferior LV branches, and in small  posterolateral coronary artery  Left ventriculography revealed left ventricular hypertrophy with normal systolic function.  Ejection fraction is at least 55%.  Total contrast used: 60 cc Omnipaque  IMPRESSION:  Normal left ventricular function with left ventricular hypertrophy.  Normal coronary arteries.    Troy Sine, MD, [facility] 06/22/2014 2:51 PM

## 2014-06-22 NOTE — Progress Notes (Signed)
Patient's right femoral cath site remains level 0, dressing changed to bandaid per order.  Bedrest ended at 2000.  Patient resting comfortably in bed with eyes closed.  Will continue to monitor.

## 2014-06-22 NOTE — Progress Notes (Signed)
Patient Profile: [name] is a 64 y.o.male hx of severe CKD on HD, PAD with prior interventions, non-obstructive CAD by cath in 2012, DM2, HTN and prior history of chest pain admitted with chest pain. Troponin minimally elevated x1 at 0.04. From notes, he has had multiple prior presentations with chest pain with workup including negative VQ scan 03/2014 and also negative Lexisccan MPI 12/2013.   Subjective: Currently CP free. Denies dyspnea.   Objective: Vital signs in last 24 hours: Temp:  [98 F (36.7 C)-98.7 F (37.1 C)] 98 F (36.7 C) (03/14 0535) Pulse Rate:  [66-84] 70 (03/14 0535) Resp:  [17-20] 18 (03/14 0535) BP: (72-147)/(44-74) 147/74 mmHg (03/14 0535) SpO2:  [98 %-99 %] 98 % (03/14 0535) Weight:  [172 lb 6.4 oz (78.2 kg)] 172 lb 6.4 oz (78.2 kg) (03/14 0535) Last BM Date: 06/20/14  Intake/Output from previous day: 03/13 0701 - 03/14 0700 In: 928.3 [P.O.:840; I.V.:88.3] Out: 450 [Urine:450] Intake/Output this shift: Total I/O In: 10 [P.O.:10] Out: 125 [Urine:125]  Medications Current Facility-Administered Medications  Medication Dose Route Frequency Provider Last Rate Last Dose  . 0.9 %  sodium chloride infusion  250 mL Intravenous PRN Satira Sark, MD      . 0.9 %  sodium chloride infusion   Intravenous Continuous Satira Sark, MD 50 mL/hr at 06/22/14 0414    . acetaminophen (TYLENOL) tablet 650 mg  650 mg Oral Q6H PRN Rise Patience, MD       Or  . acetaminophen (TYLENOL) suppository 650 mg  650 mg Rectal Q6H PRN Rise Patience, MD      . aspirin EC tablet 81 mg  81 mg Oral Daily Rise Patience, MD   81 mg at 06/21/14 0955  . atorvastatin (LIPITOR) tablet 40 mg  40 mg Oral q1800 Rise Patience, MD   40 mg at 06/21/14 1816  . calcium acetate (PHOSLO) capsule 1,334 mg  1,334 mg Oral TID WC Elmarie Shiley, MD   1,334 mg at 06/21/14 1816  . carvedilol (COREG) tablet 25 mg  25 mg Oral BID WC Rise Patience, MD   25 mg at 06/21/14 1816   . clopidogrel (PLAVIX) tablet 75 mg  75 mg Oral Q breakfast Rise Patience, MD   75 mg at 06/22/14 0757  . cyclobenzaprine (FLEXERIL) tablet 10 mg  10 mg Oral QHS Rise Patience, MD   10 mg at 06/21/14 2219  . heparin injection 5,000 Units  5,000 Units Subcutaneous 3 times per day Rise Patience, MD   5,000 Units at 06/22/14 0654  . HYDROcodone-acetaminophen (NORCO/VICODIN) 5-325 MG per tablet 1 tablet  1 tablet Oral Q6H PRN Rise Patience, MD   1 tablet at 06/20/14 1417  . insulin aspart (novoLOG) injection 0-9 Units  0-9 Units Subcutaneous TID WC Rise Patience, MD   1 Units at 06/21/14 1816  . nitroGLYCERIN (NITROSTAT) SL tablet 0.4 mg  0.4 mg Sublingual Q5 min PRN Rise Patience, MD      . ondansetron [facility]) tablet 4 mg  4 mg Oral Q6H PRN Rise Patience, MD       Or  . ondansetron (ZOFRAN) injection 4 mg  4 mg Intravenous Q6H PRN Rise Patience, MD      . pantoprazole (PROTONIX) EC tablet 40 mg  40 mg Oral Daily Rise Patience, MD   40 mg at 06/21/14 0955  . sodium chloride 0.9 % injection 3 mL  3  mL Intravenous Q12H Rise Patience, MD   3 mL at 06/21/14 2219  . sodium chloride 0.9 % injection 3 mL  3 mL Intravenous Q12H Satira Sark, MD   3 mL at 06/21/14 2220  . sodium chloride 0.9 % injection 3 mL  3 mL Intravenous PRN Satira Sark, MD        PE: General appearance: alert, cooperative and no distress Neck: no carotid bruit and no JVD Lungs: clear to auscultation bilaterally Heart: regular rate and rhythm, S1, S2 normal, no murmur, click, rub or gallop Extremities: no LEE Pulses: 2+ and symmetric Skin: warm and dry Neurologic: Grossly normal  Lab Results:   Recent Labs  06/19/14 1200 06/20/14 0800 06/22/14 0524  WBC 4.7 5.2 6.6  HGB 10.2* 10.2* 10.7*  HCT 30.1* 30.8* 33.0*  PLT 253 237 245   BMET  Recent Labs  06/19/14 1200 06/20/14 0420 06/22/14 0524  NA 136 135 133*  K 3.8 4.1 4.0  CL 101 99 98  CO2  24 24 24   GLUCOSE 112* 112* 96  BUN 63* 47* 54*  CREATININE 9.67* 7.66* 9.17*  CALCIUM 7.5* 8.0* 8.5   PT/INR  Recent Labs  06/22/14 0146  LABPROT 13.5  INR 1.01   Cardiac Panel (last 3 results)  Recent Labs  06/21/14 1425 06/21/14 2024 06/22/14 0146  TROPONINI 0.03 0.03 0.04*    Studies/Results: 2D Echo 06/17/14  Study Conclusions  - Left ventricle: The cavity size was normal. Systolic function was normal. The estimated ejection fraction was in the range of 55% to 60%. Wall motion was normal; there were no regional wall motion abnormalities. Features are consistent with a pseudonormal left ventricular filling pattern, with concomitant abnormal relaxation and increased filling pressure (grade 2 diastolic dysfunction). - Aortic valve: Trileaflet; normal thickness, mildly calcified leaflets. - Mitral valve: There was mild regurgitation. - Left atrium: The atrium was mildly dilated.   Assessment/Plan  Principal Problem:   Acute on chronic renal failure Active Problems:   Hyperlipidemia LDL goal <70   Hypertension   Peripheral arterial disease   PVD (peripheral vascular disease)   Chest pain   Renal failure (ARF), acute on chronic   1. Chest Pain: Currently CP free. Minimally elevated troponin x 1 at 0.04. 2D echo showed normal LVF with EF of 55-60%. Plan for [facility] today +/- PCI. [place] LUE functioning well Would use RFA for cath.  2. ESRD: HD per nephrology.    3. HTN: moderately elevated this am in the 0000000 systolic. Continue current Coreg.     LOS: 6 days    Brittainy M. Ladoris Gene 06/22/2014 8:35 AM

## 2014-06-22 NOTE — Progress Notes (Signed)
Jyl Heinz notified as patient currently non-tele despite having cardiac cath earlier in day.  Per Jyl Heinz, restart telemetry.  Will place telemetry, notify CCMD, and continue to monitor.

## 2014-06-22 NOTE — Progress Notes (Signed)
Site area: RFA Site Prior to Removal:  Level 0 Pressure Applied For:25 min Manual:   yes Patient Status During Pull:  stable Post Pull Site:  Level0 Post Pull Instructions Given:  yes Post Pull Pulses Present: palpable Dressing Applied:  pressure Bedrest begins @ 1600 Comments:

## 2014-06-22 NOTE — H&P (View-Only) (Signed)
Patient ID: Jon Anderson, male   DOB: [birth year], 64 y.o.   MRN: [id number]    Primary cardiologist: Dr. Carlyle Dolly  Diagnosis:  Chest Pain   Subjective:    No chest pain   Objective:   Temp:  [97.5 F (36.4 C)-98.4 F (36.9 C)] 98.1 F (36.7 C) (03/13 0900) Pulse Rate:  [65-85] 68 (03/13 0900) Resp:  [16-20] 20 (03/13 0900) BP: (94-140)/(41-73) 116/55 mmHg (03/13 0900) SpO2:  [95 %-100 %] 98 % (03/13 0900) Weight:  [76.5 kg (168 lb 10.4 oz)-77.1 kg (169 lb 15.6 oz)] 77.1 kg (169 lb 15.6 oz) (03/13 0530) Last BM Date: 06/20/14  Filed Weights   06/20/14 0650 06/20/14 1045 06/21/14 0530  Weight: 79.4 kg (175 lb 0.7 oz) 76.5 kg (168 lb 10.4 oz) 77.1 kg (169 lb 15.6 oz)    Intake/Output Summary (Last 24 hours) at 06/21/14 0949 Last data filed at 06/21/14 0900  Gross per 24 hour  Intake    783 ml  Output   3000 ml  Net  -2217 ml    Telemetry: Sinus rhythm no arrhythmia 06/21/2014   Exam:  Affect appropriate Healthy:  appears stated age HEENT: normal Neck supple with no adenopathy JVP normal no bruits no thyromegaly Lungs clear with no wheezing and good diaphragmatic motion Heart:  S1/S2 no murmur, no rub, gallop or click PMI normal Abdomen: benighn, BS positve, no tenderness, no AAA no bruit.  No HSM or HJR Distal pulses intact with no bruits No edema Neuro non-focal Skin warm and dry No muscular weakness Fistula in LUE with good thrill   Lab Results:  Basic Metabolic Panel:  Recent Labs Lab 06/18/14 0559 06/19/14 1200 06/20/14 0420  NA 135 136 135  K 3.9 3.8 4.1  CL 105 101 99  CO2 19 24 24   GLUCOSE 117* 112* 112*  BUN 81* 63* 47*  CREATININE 11.61* 9.67* 7.66*  CALCIUM 6.7* 7.5* 8.0*    CBC:  Recent Labs Lab 06/18/14 0559 06/19/14 1200 06/20/14 0800  WBC 4.7 4.7 5.2  HGB 10.5* 10.2* 10.2*  HCT 31.3* 30.1* 30.8*  MCV 84.4 83.1 83.9  PLT 216 253 237    Cardiac Enzymes:  Recent Labs Lab 06/16/14 0745 06/16/14 1334    TROPONINI 0.04* 0.03    BNP:  Recent Labs  03/25/14 0945  PROBNP 4548.0*    Echocardiogram 06/17/2014: Study Conclusions  - Left ventricle: The cavity size was normal. Systolic function was normal. The estimated ejection fraction was in the range of 55% to 60%. Wall motion was normal; there were no regional wall motion abnormalities. Features are consistent with a pseudonormal left ventricular filling pattern, with concomitant abnormal relaxation and increased filling pressure (grade 2 diastolic dysfunction). - Aortic valve: Trileaflet; normal thickness, mildly calcified leaflets. - Mitral valve: There was mild regurgitation. - Left atrium: The atrium was mildly dilated.    Medications:   Scheduled Medications: . amLODipine  10 mg Oral Daily  . aspirin EC  81 mg Oral Daily  . atorvastatin  40 mg Oral q1800  . calcium acetate  1,334 mg Oral TID WC  . carvedilol  25 mg Oral BID WC  . cloNIDine  0.1 mg Oral BID  . clopidogrel  75 mg Oral Q breakfast  . cyclobenzaprine  10 mg Oral QHS  . heparin  5,000 Units Subcutaneous 3 times per day  . insulin aspart  0-9 Units Subcutaneous TID WC  . isosorbide mononitrate  30 mg Oral Daily  .  pantoprazole  40 mg Oral Daily  . sodium chloride  3 mL Intravenous Q12H     PRN Medications: acetaminophen **OR** acetaminophen, HYDROcodone-acetaminophen, morphine injection, nitroGLYCERIN, ondansetron **OR** ondansetron (ZOFRAN) IV   Assessment:   1. Recurrent chest pain with history of nonobstructive CAD cath 2012  Normal myovue 2105  . Troponin only .04  Cath tomorrow  NPO after breakfast  2. ESRD, newly on hemodialysis. [place] LUE functioning well  Would use RFA for cath  3. PAD s/p prior PCI of left SFA.  4. Essential hypertension.   Plan/Discussion:   . Continue ASA, Lipitor, Norvasc, Coreg, Plavix, and Imdur. Cath 3/14    Jenkins Rouge

## 2014-06-22 NOTE — Progress Notes (Signed)
[facility] PROGRESS NOTE  Jon Anderson [id number] DOB: 11-17-1950 DOA: 06/16/2014 PCP: [name], MD  Assessment/Plan: 1. Pulmonary edema  -see below  2. ESRD with Volume overload -AVF placed 12/10, started HD 3/10 and tolerating well -Renal Diet, follow I/Os -Diet education -off lasix, outpatient HD set up to start at [facility] on tuesday  3. Chest pain -intermittent, cards following, plan for Cath today -negative stress test 9/15, Cards following -Continue ASA/Plavix, Coreg and statin -cycle cardiac enzymes   4. Hypertensive Urgency -improved, now BP soft and was Hypotensive in 70-80 range -now better, hold norvasc, imdur, clonidine   5. PAD s/p PCI to L SFA -continue Plavix  6. Anemia of chronic disease -s/p IV Iron  DVt proph: Hep SQ  Ambulate, Pt/OT  Code Status: Full  Family Communication: none at bedside Disposition Plan: pending cath if stable   Consultants:  Renal  cards   HPI/Subjective: Intermittent chest pressure, breathing ok                   Objective: Filed Vitals:   06/22/14 0535  BP: 147/74  Pulse: 70  Temp: 98 F (36.7 C)  Resp: 18    Intake/Output Summary (Last 24 hours) at 06/22/14 1230 Last data filed at 06/22/14 1109  Gross per 24 hour  Intake 588.33 ml  Output    775 ml  Net -186.67 ml   Filed Weights   06/20/14 1045 06/21/14 0530 06/22/14 0535  Weight: 76.5 kg (168 lb 10.4 oz) 77.1 kg (169 lb 15.6 oz) 78.2 kg (172 lb 6.4 oz)    Exam:   General: AAOx3, tired appearing, resting in bed  Cardiovascular: S1S2/RRR  Respiratory: CTAB  Abdomen: soft, NT, BS present, no organomegaly  Ext: no edema, AVF with bruit  Data Reviewed: Basic Metabolic Panel:  Recent Labs Lab 06/16/14 1334 06/17/14 0515 06/18/14 0559 06/19/14 1200 06/20/14 0420 06/22/14 0524  NA  --  139 135 136 135 133*  K  --  3.8 3.9 3.8 4.1 4.0  CL  --  106 105 101 99 98  CO2  --  22 19 24 24 24   GLUCOSE  --  94 117* 112* 112*  96  BUN  --  77* 81* 63* 47* 54*  CREATININE  --  11.30* 11.61* 9.67* 7.66* 9.17*  CALCIUM  --  7.4* 6.7* 7.5* 8.0* 8.5  PHOS 7.1*  --   --  5.4*  --   --    Liver Function Tests:  Recent Labs Lab 06/16/14 0033 06/16/14 0745 06/19/14 1200  AST 32 31  --   ALT 45 44  --   ALKPHOS 106 104  --   BILITOT 0.3 0.5  --   PROT 7.2 6.6  --   ALBUMIN 3.5 3.2* 2.9*   No results for input(s): LIPASE, AMYLASE in the last 168 hours. No results for input(s): AMMONIA in the last 168 hours. CBC:  Recent Labs Lab 06/16/14 0033 06/16/14 0745 06/17/14 0515 06/18/14 0559 06/19/14 1200 06/20/14 0800 06/22/14 0524  WBC 10.1 7.1 4.6 4.7 4.7 5.2 6.6  NEUTROABS 6.4 4.2  --   --   --   --   --   HGB 10.8* 10.5* 10.7* 10.5* 10.2* 10.2* 10.7*  HCT 31.9* 31.9* 31.9* 31.3* 30.1* 30.8* 33.0*  MCV 83.1 83.5 83.5 84.4 83.1 83.9 84.6  PLT 279 241 246 216 253 237 245   Cardiac Enzymes:  Recent Labs Lab 06/16/14 0745 06/16/14 1334 06/21/14 1425 06/21/14  2024 06/22/14 0146  TROPONINI 0.04* 0.03 0.03 0.03 0.04*   BNP (last 3 results)  Recent Labs  06/16/14 0033  BNP 432.2*    ProBNP (last 3 results)  Recent Labs  03/25/14 0945  PROBNP 4548.0*    CBG:  Recent Labs Lab 06/21/14 1313 06/21/14 1618 06/21/14 2128 06/22/14 0549 06/22/14 1156  GLUCAP 162* 134* 159* 92 114*    Recent Results (from the past 240 hour(s))  MRSA PCR Screening     Status: None   Collection Time: 06/20/14  9:21 PM  Result Value Ref Range Status   MRSA by PCR NEGATIVE NEGATIVE Final    Comment:        The GeneXpert MRSA Assay (FDA approved for NASAL specimens only), is one component of a comprehensive MRSA colonization surveillance program. It is not intended to diagnose MRSA infection nor to guide or monitor treatment for MRSA infections.      Studies: No results found.  Scheduled Meds: . aspirin EC  81 mg Oral Daily  . atorvastatin  40 mg Oral q1800  . calcium acetate  1,334 mg Oral  TID WC  . carvedilol  12.5 mg Oral BID WC  . clopidogrel  75 mg Oral Q breakfast  . cyclobenzaprine  10 mg Oral QHS  . heparin  5,000 Units Subcutaneous 3 times per day  . insulin aspart  0-9 Units Subcutaneous TID WC  . multivitamin  1 tablet Oral QHS  . pantoprazole  40 mg Oral Daily  . sodium chloride  3 mL Intravenous Q12H  . sodium chloride  3 mL Intravenous Q12H   Continuous Infusions: . sodium chloride 50 mL/hr at 06/22/14 0414   Antibiotics Given (last 72 hours)    None      Principal Problem:   Acute on chronic renal failure Active Problems:   Hyperlipidemia LDL goal <70   Hypertension   Peripheral arterial disease   PVD (peripheral vascular disease)   Chest pain   Renal failure (ARF), acute on chronic    Time spent: 54min    [facility] Pager [phone]. If 7PM-7AM, please contact night-coverage at [email or web address], password [facility] 06/22/2014, 12:30 PM  LOS: 6 days

## 2014-06-22 NOTE — Progress Notes (Signed)
Subjective: Interval History: has no complaint.  Objective: Vital signs in last 24 hours: Temp:  [98 F (36.7 C)-98.7 F (37.1 C)] 98 F (36.7 C) (03/14 0535) Pulse Rate:  [66-84] 70 (03/14 0535) Resp:  [17-20] 18 (03/14 0535) BP: (72-147)/(44-74) 147/74 mmHg (03/14 0535) SpO2:  [98 %-99 %] 98 % (03/14 0535) Weight:  [78.2 kg (172 lb 6.4 oz)] 78.2 kg (172 lb 6.4 oz) (03/14 0535) Weight change: 1.7 kg (3 lb 12 oz)  Intake/Output from previous day: 03/13 0701 - 03/14 0700 In: 928.3 [P.O.:840; I.V.:88.3] Out: 450 [Urine:450] Intake/Output this shift: Total I/O In: 10 [P.O.:10] Out: 125 [Urine:125]  General appearance: alert, cooperative and no distress Resp: clear to auscultation bilaterally Cardio: S1, S2 normal, S4 present and systolic murmur: holosystolic 2/6, blowing at apex GI: pos bs,liver down 4 cm,soft Extremities: edema 1+ and AVF LUA  Lab Results:  Recent Labs  06/20/14 0800 06/22/14 0524  WBC 5.2 6.6  HGB 10.2* 10.7*  HCT 30.8* 33.0*  PLT 237 245   BMET:  Recent Labs  06/20/14 0420 06/22/14 0524  NA 135 133*  K 4.1 4.0  CL 99 98  CO2 24 24  GLUCOSE 112* 96  BUN 47* 54*  CREATININE 7.66* 9.17*  CALCIUM 8.0* 8.5   No results for input(s): PTH in the last 72 hours. Iron Studies: No results for input(s): IRON, TIBC, TRANSFERRIN, FERRITIN in the last 72 hours.  Studies/Results: No results found.  I have reviewed the patient's current medications.  Assessment/Plan: 1 ESRD for HD on Tues. Mild vol xs 2 Anemia on epo and got Fe 3 CAD per Cards 4 HPTH Vit D 5 HTN 6 PVD P HD,epo, Cath    LOS: 6 days   Idalee Foxworthy L 06/22/2014,9:50 AM

## 2014-06-22 NOTE — Interval H&P Note (Signed)
Cath Lab Visit (complete for each Cath Lab visit)  Clinical Evaluation Leading to the Procedure:   ACS: No.  Non-ACS:    Anginal Classification: CCS IV  Anti-ischemic medical therapy: Maximal Therapy (2 or more classes of medications)  Non-Invasive Test Results: No non-invasive testing performed  Prior CABG: No previous CABG      History and Physical Interval Note:  06/22/2014 1:41 PM  Jon Anderson  has presented today for surgery, with the diagnosis of unstable angina  The various methods of treatment have been discussed with the patient and family. After consideration of risks, benefits and other options for treatment, the patient has consented to  Procedure(s): LEFT HEART CATHETERIZATION WITH CORONARY ANGIOGRAM (N/A) as a surgical intervention .  The patient's history has been reviewed, patient examined, no change in status, stable for surgery.  I have reviewed the patient's chart and labs.  Questions were answered to the patient's satisfaction.     KELLY,THOMAS A

## 2014-06-22 NOTE — Clinical Documentation Improvement (Signed)
  Admitted with CP, A/CRF requiring HD for "volume overload" with bilateral LE edema, SOB, also "grade 2 diastolic dysfunction" per ECHO. 3/8 CXR: "Increasing cardiomegaly, pulmonary edema confluent in the lung bases without pleural effusion", BNP 438, treated with Lasix 120 mg IV q8h progressing to HD with 10 lb weight loss. Please clarify if possible the diagnosis of "volume overload" to better reflect this patient's severity of illness and risk of mortality.  Possible Conditions -- Acute pulmonary edema  -- Acute on chronic pulmonary edema  -- Other condition (please specify) -- Unable to clinically determine  Thank you,  Ezekiel Ina ,RN Clinical Documentation Specialist:  [facility]

## 2014-06-22 NOTE — Progress Notes (Signed)
06/22/2014 3:45 PM Hemodialysis Outpatient Note; this patient has been accepted at the [facility] on [address] on a Tuesday Thursday and Saturday 1st shift schedule. The center can accommodate the patient on Thursday March 17th only if the patient can visit the center the day before to sign paperwork and consents. Please have patient visit the center on Wednesday at 2:30pm to sign paperwork. Thank you. Gordy Savers

## 2014-06-23 ENCOUNTER — Inpatient Hospital Stay (HOSPITAL_COMMUNITY): Payer: BC Managed Care – PPO

## 2014-06-23 ENCOUNTER — Encounter (HOSPITAL_COMMUNITY): Payer: Self-pay | Admitting: Cardiovascular Disease

## 2014-06-23 LAB — CBC
HCT: 34 % — ABNORMAL LOW (ref 39.0–52.0)
Hemoglobin: 11.3 g/dL — ABNORMAL LOW (ref 13.0–17.0)
MCH: 28.2 pg (ref 26.0–34.0)
MCHC: 33.2 g/dL (ref 30.0–36.0)
MCV: 84.8 fL (ref 78.0–100.0)
Platelets: 290 10*3/uL (ref 150–400)
RBC: 4.01 MIL/uL — ABNORMAL LOW (ref 4.22–5.81)
RDW: 15.1 % (ref 11.5–15.5)
WBC: 6.4 10*3/uL (ref 4.0–10.5)

## 2014-06-23 LAB — RENAL FUNCTION PANEL
Albumin: 3 g/dL — ABNORMAL LOW (ref 3.5–5.2)
Anion gap: 10 (ref 5–15)
BUN: 64 mg/dL — ABNORMAL HIGH (ref 6–23)
CO2: 26 mmol/L (ref 19–32)
Calcium: 8.2 mg/dL — ABNORMAL LOW (ref 8.4–10.5)
Chloride: 98 mmol/L (ref 96–112)
Creatinine, Ser: 10.72 mg/dL — ABNORMAL HIGH (ref 0.50–1.35)
GFR calc Af Amer: 5 mL/min — ABNORMAL LOW (ref >90)
GFR calc non Af Amer: 4 mL/min — ABNORMAL LOW (ref >90)
Glucose, Bld: 93 mg/dL (ref 70–99)
Phosphorus: 6.2 mg/dL — ABNORMAL HIGH (ref 2.3–4.6)
Potassium: 4.1 mmol/L (ref 3.5–5.1)
Sodium: 134 mmol/L — ABNORMAL LOW (ref 135–145)

## 2014-06-23 LAB — GLUCOSE, CAPILLARY
Glucose-Capillary: 102 mg/dL — ABNORMAL HIGH (ref 70–99)
Glucose-Capillary: 119 mg/dL — ABNORMAL HIGH (ref 70–99)
Glucose-Capillary: 104 mg/dL — ABNORMAL HIGH (ref 70–99)
Glucose-Capillary: 118 mg/dL — ABNORMAL HIGH (ref 70–99)

## 2014-06-23 MED ORDER — SODIUM CHLORIDE 0.9 % IV SOLN: 100.0000 mL | INTRAVENOUS | Status: DC | PRN

## 2014-06-23 MED ORDER — ALTEPLASE 2 MG IJ SOLR
2.0000 mg | Freq: Once | INTRAMUSCULAR | Status: DC | PRN
  Filled 2014-06-23: qty 2

## 2014-06-23 MED ORDER — NEPRO/CARBSTEADY PO LIQD
237.0000 mL | ORAL | Status: DC | PRN
  Filled 2014-06-23: qty 237

## 2014-06-23 MED ORDER — HEPARIN SODIUM (PORCINE) 1000 UNIT/ML DIALYSIS
100.0000 [IU]/kg | INTRAMUSCULAR | Status: DC | PRN
Start: 2014-06-23 — End: 2014-06-23
  Filled 2014-06-23: qty 8

## 2014-06-23 MED ORDER — LIDOCAINE-PRILOCAINE 2.5-2.5 % EX CREA: 1.0000 "application " | TOPICAL_CREAM | CUTANEOUS | Status: DC | PRN

## 2014-06-23 MED ORDER — HEPARIN SODIUM (PORCINE) 1000 UNIT/ML DIALYSIS: 1000.0000 [IU] | INTRAMUSCULAR | Status: DC | PRN

## 2014-06-23 MED ORDER — CALCIUM ACETATE (PHOS BINDER) 667 MG PO CAPS: 1334.0000 mg | ORAL_CAPSULE | Freq: Three times a day (TID) | ORAL | Status: DC

## 2014-06-23 MED ORDER — CARVEDILOL 12.5 MG PO TABS: 12.5000 mg | ORAL_TABLET | Freq: Two times a day (BID) | ORAL | Status: DC

## 2014-06-23 MED ORDER — RENA-VITE PO TABS: 1.0000 | ORAL_TABLET | Freq: Every day | ORAL | Status: DC

## 2014-06-23 MED ORDER — PENTAFLUOROPROP-TETRAFLUOROETH EX AERO: 1.0000 "application " | INHALATION_SPRAY | CUTANEOUS | Status: DC | PRN

## 2014-06-23 MED ORDER — LIDOCAINE HCL (PF) 1 % IJ SOLN: 5.0000 mL | INTRAMUSCULAR | Status: DC | PRN

## 2014-06-23 NOTE — Procedures (Signed)
Patient was seen on dialysis and the procedure was supervised.  BFR 350  Via AVF BP is  149/60.   Patient appears to be tolerating treatment well  Shaquel Chavous A 06/23/2014

## 2014-06-23 NOTE — Progress Notes (Signed)
Patient now complains of shortness of breath and dizziness while up getting ready to go home. Patient states he is "hungry". Applesauce given until lunch tray arrives. VS stable at this time. Dr. Broadus John made aware. It was reported that Dialysis removed 3.5L today and Dr. Broadus John made aware. CXR to be done, will continue to monitor patient to see if improvement. If not, patient will not be discharged.

## 2014-06-23 NOTE — Progress Notes (Signed)
Patient states he feels much better. Still complains of slight dizziness, but states "my head is starting to clear up." VS rechecked and stable. Dr. Broadus John made aware and okay to discharge home.

## 2014-06-23 NOTE — Progress Notes (Signed)
Cardiac monitor discontinued, CCMD notified. Home discharge instructions and medications discussed with patient. Discussed diet, activity and follow up appointments. Patient has dialysis in hand and was able to verbalize understanding of all instructions via teach back.

## 2014-06-23 NOTE — Progress Notes (Signed)
Subjective: Interval History: Had cardiac cath yesterday- normal coronary arteries and normal LV function with LVH- seen on Hd this AM- thinks he is going home today  Objective: Vital signs in last 24 hours: Temp:  [97.7 F (36.5 C)-98.7 F (37.1 C)] 98.4 F (36.9 C) (03/15 0653) Pulse Rate:  [64-78] 76 (03/15 0730) Resp:  [9-20] 20 (03/15 0653) BP: (132-194)/(61-86) 149/80 mmHg (03/15 0730) SpO2:  [93 %-100 %] 96 % (03/15 0653) Weight:  [77.656 kg (171 lb 3.2 oz)-78.3 kg (172 lb 9.9 oz)] 78.3 kg (172 lb 9.9 oz) (03/15 0653) Weight change: -0.544 kg (-1 lb 3.2 oz)  Intake/Output from previous day: 03/14 0701 - 03/15 0700 In: 935.3 [P.O.:650; I.V.:285.3] Out: 1350 [Urine:1350] Intake/Output this shift:    General appearance: alert, cooperative and no distress Resp: clear to auscultation bilaterally Cardio: S1, S2 normal, S4 present and systolic murmur: holosystolic 2/6, blowing at apex GI: pos bs,liver down 4 cm,soft Extremities: edema 1+ and AVF LUA  Lab Results:  Recent Labs  06/22/14 0524 06/23/14 0630  WBC 6.6 6.4  HGB 10.7* 11.3*  HCT 33.0* 34.0*  PLT 245 290   BMET:   Recent Labs  06/22/14 0524 06/23/14 0630  NA 133* 134*  K 4.0 4.1  CL 98 98  CO2 24 26  GLUCOSE 96 93  BUN 54* 64*  CREATININE 9.17* 10.72*  CALCIUM 8.5 8.2*   No results for input(s): PTH in the last 72 hours. Iron Studies: No results for input(s): IRON, TIBC, TRANSFERRIN, FERRITIN in the last 72 hours.  Studies/Results: No results found.  I have reviewed the patient's current medications.  Assessment/Plan: 1 ESRD - new start this hosp via AVF- set up as OP at [facility]- will be done there on Thursday 2 Anemia on epo and got Fe- current hgb 11.3 3 CAD per Cards- negative cath yesterday 4 HPTH Vit D- on phoslo- phos today 6.2- no PTH in system- no vitamin D 5 HTN- controlled on coreg- volume much better since starting HD 6 PVD 7. Dispo- is [place] for discharge from our standpoint  today     LOS: 7 days   Kamea Dacosta A 06/23/2014,7:56 AM

## 2014-06-23 NOTE — Progress Notes (Signed)
Patient has returned to unit from dialysis. Patient alert and oriented and has no complaints at this time. Discharge orders in for patient. Dr. Broadus John called and confirmed discharge orders.

## 2014-06-23 NOTE — Discharge Summary (Signed)
Physician Discharge Summary  Jon Anderson [id number] DOB: Jan 13, 1951 DOA: 06/16/2014  PCP: [name], MD  Admit date: 06/16/2014 Discharge date: 06/23/2014  Time spent: 45 minutes  Recommendations for Outpatient Follow-up:  1. Dr.Morreira in 1 week 2. Monitor CBGs, stopped Insulin pump since CBGs in low/normal range through hospitalization  Discharge Diagnoses:  Principal Problem:   Acute on chronic renal failure   New ESRD   Hyperlipidemia LDL goal <70   Hypertension   Peripheral arterial disease   PVD (peripheral vascular disease)   Chest pain   DM   Discharge Condition: stable  Diet recommendation: Renal  Filed Weights   06/22/14 0535 06/23/14 0610 06/23/14 0653  Weight: 78.2 kg (172 lb 6.4 oz) 77.656 kg (171 lb 3.2 oz) 78.3 kg (172 lb 9.9 oz)    History of present illness:  Jon Anderson is a 64 y.o. male with history of chronic kidney disease, nonobstructive CAD, peripheral vascular disease, chronic anemia, diabetes mellitus type 2 presented to the ER with chest pain for the last 2 weeks and also had noticed increasing peripheral edema mostly in the lower extremities.  Hospital Course:  1. Pulmonary edema -see below  2. ESRD with Volume overload -He had an AVF which was placed 12/10, and already mature at the time of admission -started Hemodialysis 3/10 and tolerating well -Renal Diet, follow I/Os -Diet education -off lasix, outpatient HD set up to start at [facility] on tuesday  3. Chest pain -intermittent, seen by Cardiology, underwent cardiac Cath 3/15; normal coronaries, and LV function -negative stress test 9/15, Cards following -Continue ASA/Plavix, Coreg and statin -cardiac enzymes negative  4. Hypertensive Urgency -improved, now BP soft and was Hypotensive in 70-80 range -continue Coreg and amlodipine -other BP meds stopped  5. PAD s/p PCI to L SFA -continue Plavix  6. Anemia of chronic disease -s/p IV Iron  Procedures:  Started  hemodialysis  Cardiac Cath: Normal coronaries and LV function  Consultations:  Renal  Cardiology  Discharge Exam: Filed Vitals:   06/23/14 0900  BP: 162/84  Pulse: 81  Temp:   Resp:     General: AAOx3, no distress, seen on HD Cardiovascular: S!S2/RRR Respiratory: CTAB  Discharge Instructions   Discharge Instructions    Discharge instructions    Complete by:  As directed   Renal Diabetic Diet     Increase activity slowly    Complete by:  As directed           Current Discharge Medication List    START taking these medications   Details  calcium acetate (PHOSLO) 667 MG capsule Take 2 capsules (1,334 mg total) by mouth 3 (three) times daily with meals. Qty: 90 capsule, Refills: 0    multivitamin (RENA-VIT) TABS tablet Take 1 tablet by mouth at bedtime. Qty: 30 tablet, Refills: 0      CONTINUE these medications which have CHANGED   Details  carvedilol (COREG) 12.5 MG tablet Take 1 tablet (12.5 mg total) by mouth 2 (two) times daily with a meal. Qty: 60 tablet, Refills: 0      CONTINUE these medications which have NOT CHANGED   Details  amLODipine (NORVASC) 10 MG tablet Take 10 mg by mouth daily. Refills: 5    aspirin EC 81 MG tablet Take 1 tablet (81 mg total) by mouth daily.   Associated Diagnoses: Coronary atherosclerosis of unspecified type of vessel, native or graft    atorvastatin (LIPITOR) 40 MG tablet Take 1 tablet (40 mg total) by mouth  daily at 6 PM. Qty: 30 tablet, Refills: 5    calcitRIOL (ROCALTROL) 0.25 MCG capsule Take 0.25 mcg by mouth daily.    Cholecalciferol (VITAMIN D PO) Take 1 tablet by mouth daily.     clopidogrel (PLAVIX) 75 MG tablet Take 1 tablet (75 mg total) by mouth daily with breakfast. Qty: 30 tablet, Refills: 11    cyclobenzaprine (FLEXERIL) 10 MG tablet Take 10 mg by mouth at bedtime.     HYDROcodone-acetaminophen (NORCO) 5-325 MG per tablet Take 1 tablet by mouth every 6 (six) hours as needed for moderate pain. Qty:  15 tablet, Refills: 0    nitroGLYCERIN (NITROSTAT) 0.4 MG SL tablet Place 1 tablet (0.4 mg total) under the tongue every 5 (five) minutes as needed for chest pain. Qty: 25 tablet, Refills: 3    pantoprazole (PROTONIX) 40 MG tablet Take 40 mg by mouth daily.      STOP taking these medications     cloNIDine (CATAPRES) 0.3 MG tablet      furosemide (LASIX) 40 MG tablet      insulin aspart (NOVOLOG) 100 UNIT/ML injection      isosorbide-hydrALAZINE (BIDIL) 20-37.5 MG per tablet        Allergies  Allergen Reactions  . Kiwi Extract Swelling    Facial swelling    Follow-up Information    Follow up with MOREIRA,ROY, MD. Schedule an appointment as soon as possible for a visit in 1 week.   Specialty:  Internal Medicine   Why:  keep a log of Blood sugars and take it to PCP Follow up   Contact information:   [phone]  [phone]        The results of significant diagnostics from this hospitalization (including imaging, microbiology, ancillary and laboratory) are listed below for reference.    Significant Diagnostic Studies: Dg Chest 2 View  06/16/2014   CLINICAL DATA:  LEFT-sided chest pain tonight. History of hypertension and diabetes.  EXAM: CHEST  2 VIEW  COMPARISON:  Chest radiograph June 12, 2014  FINDINGS: The cardiac silhouette appears mild to moderately enlarged, increased from prior imaging. Pulmonary vascular congestion, strandy densities in lung bases. Mild interstitial prominence. No pleural effusion. No pneumothorax. Soft tissue planes and included osseous structures are nonsuspicious. Patient is edentulous.  IMPRESSION: Increasing cardiomegaly, pulmonary edema confluent in the lung bases without pleural effusion.   Electronically Signed   By: Elon Alas   On: 06/16/2014 02:17   Dg Chest 2 View  06/12/2014   CLINICAL DATA:  Cough and shortness of breath for 1 week.  EXAM: CHEST  2 VIEW  COMPARISON:  March 25, 2014.  FINDINGS: The  heart size and mediastinal contours are within normal limits. No pneumothorax or pleural effusion is noted. Increased central pulmonary vascular congestion is noted. Bilateral perihilar and basilar interstitial densities are noted concerning for pulmonary edema. The visualized skeletal structures are unremarkable.  IMPRESSION: Increased central pulmonary vascular congestion with probable bilateral pulmonary edema is noted.   Electronically Signed   By: Marijo Conception, M.D.   On: 06/12/2014 09:45    Microbiology: Recent Results (from the past 240 hour(s))  MRSA PCR Screening     Status: None   Collection Time: 06/20/14  9:21 PM  Result Value Ref Range Status   MRSA by PCR NEGATIVE NEGATIVE Final    Comment:        The GeneXpert MRSA Assay (FDA approved for NASAL specimens only), is one component of a  comprehensive MRSA colonization surveillance program. It is not intended to diagnose MRSA infection nor to guide or monitor treatment for MRSA infections.      Labs: Basic Metabolic Panel:  Recent Labs Lab 06/16/14 1334  06/18/14 0559 06/19/14 1200 06/20/14 0420 06/22/14 0524 06/23/14 0630  NA  --   < > 135 136 135 133* 134*  K  --   < > 3.9 3.8 4.1 4.0 4.1  CL  --   < > 105 101 99 98 98  CO2  --   < > 19 24 24 24 26   GLUCOSE  --   < > 117* 112* 112* 96 93  BUN  --   < > 81* 63* 47* 54* 64*  CREATININE  --   < > 11.61* 9.67* 7.66* 9.17* 10.72*  CALCIUM  --   < > 6.7* 7.5* 8.0* 8.5 8.2*  PHOS 7.1*  --   --  5.4*  --   --  6.2*  < > = values in this interval not displayed. Liver Function Tests:  Recent Labs Lab 06/19/14 1200 06/23/14 0630  ALBUMIN 2.9* 3.0*   No results for input(s): LIPASE, AMYLASE in the last 168 hours. No results for input(s): AMMONIA in the last 168 hours. CBC:  Recent Labs Lab 06/18/14 0559 06/19/14 1200 06/20/14 0800 06/22/14 0524 06/23/14 0630  WBC 4.7 4.7 5.2 6.6 6.4  HGB 10.5* 10.2* 10.2* 10.7* 11.3*  HCT 31.3* 30.1* 30.8* 33.0*  34.0*  MCV 84.4 83.1 83.9 84.6 84.8  PLT 216 253 237 245 290   Cardiac Enzymes:  Recent Labs Lab 06/16/14 1334 06/21/14 1425 06/21/14 2024 06/22/14 0146  TROPONINI 0.03 0.03 0.03 0.04*   BNP: BNP (last 3 results)  Recent Labs  06/16/14 0033  BNP 432.2*    ProBNP (last 3 results)  Recent Labs  03/25/14 0945  PROBNP 4548.0*    CBG:  Recent Labs Lab 06/22/14 0549 06/22/14 1156 06/22/14 2133 06/23/14 0554 06/23/14 0848  GLUCAP 92 114* 184* 119* 104*       Signed:  Chael Urenda  [facility] 06/23/2014, 9:50 AM

## 2014-06-23 NOTE — Progress Notes (Signed)
Patient taken out for discharge via wheelchair.

## 2014-06-23 NOTE — Progress Notes (Signed)
Pt had episode x1 of SB 39bpm, during HD tx. Pt in no acute distress, denies any discomfort at this time. Attending MD made aware with no new orders, Pt monitored.

## 2014-06-24 DIAGNOSIS — D688 Other specified coagulation defects: Secondary | ICD-10-CM | POA: Insufficient documentation

## 2014-06-24 DIAGNOSIS — D509 Iron deficiency anemia, unspecified: Secondary | ICD-10-CM | POA: Insufficient documentation

## 2014-06-24 DIAGNOSIS — R509 Fever, unspecified: Secondary | ICD-10-CM | POA: Insufficient documentation

## 2014-06-24 DIAGNOSIS — N2581 Secondary hyperparathyroidism of renal origin: Secondary | ICD-10-CM | POA: Insufficient documentation

## 2014-06-24 DIAGNOSIS — Z4931 Encounter for adequacy testing for hemodialysis: Secondary | ICD-10-CM | POA: Insufficient documentation

## 2014-06-24 DIAGNOSIS — E1151 Type 2 diabetes mellitus with diabetic peripheral angiopathy without gangrene: Secondary | ICD-10-CM | POA: Insufficient documentation

## 2014-06-24 DIAGNOSIS — L299 Pruritus, unspecified: Secondary | ICD-10-CM | POA: Insufficient documentation

## 2014-06-24 DIAGNOSIS — R197 Diarrhea, unspecified: Secondary | ICD-10-CM | POA: Insufficient documentation

## 2014-07-02 DIAGNOSIS — K760 Fatty (change of) liver, not elsewhere classified: Secondary | ICD-10-CM | POA: Insufficient documentation

## 2014-07-22 ENCOUNTER — Telehealth (HOSPITAL_COMMUNITY): Payer: Self-pay | Admitting: *Deleted

## 2014-07-22 ENCOUNTER — Other Ambulatory Visit (HOSPITAL_COMMUNITY): Payer: Self-pay | Admitting: *Deleted

## 2014-07-30 ENCOUNTER — Ambulatory Visit: Payer: BC Managed Care – PPO

## 2014-07-31 ENCOUNTER — Ambulatory Visit (INDEPENDENT_AMBULATORY_CARE_PROVIDER_SITE_OTHER): Payer: BC Managed Care – PPO

## 2014-07-31 DIAGNOSIS — M79673 Pain in unspecified foot: Secondary | ICD-10-CM | POA: Diagnosis not present

## 2014-07-31 DIAGNOSIS — B351 Tinea unguium: Secondary | ICD-10-CM | POA: Diagnosis not present

## 2014-07-31 NOTE — Progress Notes (Signed)
He presents today with chief complaint of painful calluses to the plantar aspect of bilateral foot and painfully elongated toenails 1 through 5 bilateral. He would also like to pick at his diabetic shoes today.  Objective: Vital signs are stable he is alert and oriented 3. Diabetic shoes are too narrow for his swollen feet so we will send back and request another pair. His nails are thick yellow dystrophic with mycotic and painful palpation as well as debridement. He had a porokeratotic lesion plantar aspect is sub-fifth metatarsals bilaterally which I didn't nucleated.  Assessment diabetes mellitus with hammertoe deformity diabetic peripheral neuropathy. Porokeratosis bilateral and pain in limb secondary to onychomycosis.  Plan: Debrided all nails 1 through 5 bilateral. His covered service secondary to pain we also debrided the porokeratotic lesions. We also ordered him a  Diabetic shoes.

## 2014-10-03 DIAGNOSIS — T826XXA 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, initial encounter: Secondary | ICD-10-CM | POA: Insufficient documentation

## 2014-10-21 ENCOUNTER — Other Ambulatory Visit: Payer: Self-pay | Admitting: Cardiovascular Disease

## 2014-10-21 ENCOUNTER — Telehealth (HOSPITAL_COMMUNITY): Payer: Self-pay | Admitting: *Deleted

## 2014-10-21 DIAGNOSIS — I739 Peripheral vascular disease, unspecified: Secondary | ICD-10-CM

## 2014-10-30 ENCOUNTER — Ambulatory Visit: Payer: BC Managed Care – PPO

## 2014-11-06 ENCOUNTER — Ambulatory Visit (INDEPENDENT_AMBULATORY_CARE_PROVIDER_SITE_OTHER): Payer: BC Managed Care – PPO | Admitting: Podiatry

## 2014-11-06 ENCOUNTER — Encounter: Payer: Self-pay | Admitting: Podiatry

## 2014-11-06 VITALS — BP 114/68 | HR 86 | Resp 18

## 2014-11-06 DIAGNOSIS — M79673 Pain in unspecified foot: Secondary | ICD-10-CM | POA: Diagnosis not present

## 2014-11-06 DIAGNOSIS — E1142 Type 2 diabetes mellitus with diabetic polyneuropathy: Secondary | ICD-10-CM

## 2014-11-06 DIAGNOSIS — B351 Tinea unguium: Secondary | ICD-10-CM

## 2014-11-08 NOTE — Progress Notes (Signed)
Patient ID: Jon Anderson, male   DOB: 11/27/1950, 64 y.o.   MRN: [id number] Complaint:  Visit Type: Patient returns to my office for continued preventative foot care services. Complaint: Patient states" my nails have grown long and thick and become painful to walk and wear shoes" Patient has been diagnosed with DM with PVD. The patient presents for preventative foot care services. No changes to ROS  Podiatric Exam: Vascular: dorsalis pedis and posterior tibial pulses are palpable dminished.. Capillary return is immediate. Temperature gradient is WNL. Skin turgor WNL  Sensorium: Diminished  Semmes Weinstein monofilament test. Normal tactile sensation bilaterally. Nail Exam: Pt has thick disfigured discolored nails with subungual debris noted bilateral entire nail hallux through fifth toenails Ulcer Exam: There is no evidence of ulcer or pre-ulcerative changes or infection. Orthopedic Exam: Muscle tone and strength are WNL. No limitations in general ROM. No crepitus or effusions noted. Foot type and digits show no abnormalities. Bony prominences are unremarkable. Skin: No Porokeratosis. No infection or ulcers  Diagnosis:  Onychomycosis, , Pain in right toe, pain in left toes  Treatment & Plan Procedures and Treatment: Consent by patient was obtained for treatment procedures. The patient understood the discussion of treatment and procedures well. All questions were answered thoroughly reviewed. Debridement of mycotic and hypertrophic toenails, 1 through 5 bilateral and clearing of subungual debris. No ulceration, no infection noted.  Return Visit-Office Procedure: Patient instructed to return to the office for a follow up visit 3 months for continued evaluation and treatment.

## 2014-11-10 ENCOUNTER — Ambulatory Visit (HOSPITAL_COMMUNITY)
Admission: RE | Admit: 2014-11-10 | Discharge: 2014-11-10 | Disposition: A | Discharge status: Home / Self Care | Payer: BC Managed Care – PPO | Source: Ambulatory Visit | Attending: Cardiovascular Disease | Admitting: Cardiovascular Disease

## 2014-11-10 DIAGNOSIS — I6523 Occlusion and stenosis of bilateral carotid arteries: Secondary | ICD-10-CM | POA: Insufficient documentation

## 2014-11-10 DIAGNOSIS — I739 Peripheral vascular disease, unspecified: Secondary | ICD-10-CM

## 2014-11-12 ENCOUNTER — Encounter: Payer: Self-pay | Admitting: *Deleted

## 2014-12-03 ENCOUNTER — Other Ambulatory Visit: Payer: Self-pay | Admitting: Nephrology

## 2014-12-03 DIAGNOSIS — N644 Mastodynia: Secondary | ICD-10-CM

## 2014-12-18 ENCOUNTER — Other Ambulatory Visit: Payer: Self-pay | Admitting: Nephrology

## 2014-12-18 DIAGNOSIS — N644 Mastodynia: Secondary | ICD-10-CM

## 2014-12-22 ENCOUNTER — Other Ambulatory Visit: Payer: BC Managed Care – PPO

## 2015-01-12 ENCOUNTER — Ambulatory Visit
Admission: RE | Admit: 2015-01-12 | Discharge: 2015-01-12 | Disposition: A | Discharge status: Home / Self Care | Payer: BC Managed Care – PPO | Source: Ambulatory Visit | Attending: Nephrology | Admitting: Nephrology

## 2015-01-12 ENCOUNTER — Other Ambulatory Visit: Payer: Self-pay | Admitting: Nephrology

## 2015-01-12 DIAGNOSIS — N644 Mastodynia: Secondary | ICD-10-CM

## 2015-01-26 ENCOUNTER — Ambulatory Visit: Payer: BC Managed Care – PPO | Admitting: Podiatry

## 2015-02-16 ENCOUNTER — Ambulatory Visit (INDEPENDENT_AMBULATORY_CARE_PROVIDER_SITE_OTHER): Payer: BC Managed Care – PPO | Admitting: Podiatry

## 2015-02-16 ENCOUNTER — Encounter: Payer: Self-pay | Admitting: Podiatry

## 2015-02-16 VITALS — BP 136/74 | HR 81 | Resp 16

## 2015-02-16 DIAGNOSIS — M79674 Pain in right toe(s): Secondary | ICD-10-CM

## 2015-02-16 DIAGNOSIS — M79675 Pain in left toe(s): Secondary | ICD-10-CM | POA: Diagnosis not present

## 2015-02-16 DIAGNOSIS — L84 Corns and callosities: Secondary | ICD-10-CM | POA: Diagnosis not present

## 2015-02-16 DIAGNOSIS — E1142 Type 2 diabetes mellitus with diabetic polyneuropathy: Secondary | ICD-10-CM

## 2015-02-16 DIAGNOSIS — B351 Tinea unguium: Secondary | ICD-10-CM | POA: Diagnosis not present

## 2015-02-16 NOTE — Progress Notes (Signed)
He presents today chief complaint of painful elongated toenails as well as corns and calluses to the plantar aspect of the fifth metatarsals bilateral.  Objective: Vital signs are stable he is alert and oriented 3. Pulses are strongly palpable. Neurologic sensorium is intact per Semmes-Weinstein monofilament. Deep tendon reflexes are intact. Her muscle strength is equal bilateral. Nails are thick yellow dystrophic onychomycotic painful on palpation as well as debridement. He also has reactive hyperkeratosis of fifth metatarsal heads bilateral which do not appear to be opened nor do they appear to be infected.  Assessment: Pain in limb secondary to onychomycosis and porokeratosis bilateral.  Plan: Debrided nails and calluses bilateral. Follow up with him as needed.

## 2015-02-24 DIAGNOSIS — E8779 Other fluid overload: Secondary | ICD-10-CM | POA: Insufficient documentation

## 2015-05-25 ENCOUNTER — Encounter: Payer: Self-pay | Admitting: Podiatry

## 2015-05-25 ENCOUNTER — Ambulatory Visit (INDEPENDENT_AMBULATORY_CARE_PROVIDER_SITE_OTHER): Payer: Medicare Other | Admitting: Podiatry

## 2015-05-25 DIAGNOSIS — E1142 Type 2 diabetes mellitus with diabetic polyneuropathy: Secondary | ICD-10-CM

## 2015-05-25 DIAGNOSIS — Q828 Other specified congenital malformations of skin: Secondary | ICD-10-CM

## 2015-05-25 DIAGNOSIS — M79676 Pain in unspecified toe(s): Secondary | ICD-10-CM | POA: Diagnosis not present

## 2015-05-25 DIAGNOSIS — B351 Tinea unguium: Secondary | ICD-10-CM | POA: Diagnosis not present

## 2015-05-25 NOTE — Progress Notes (Signed)
He presents today for his diabetic foot care and routine nail debridement. He states that he is doing quite well.he still experiences some of the numbness and tingling but would like to have his nails cut as well as the calluses trimmed.  Objective: Vital signs are stable he is alert and oriented 3. Pulses are palpable. No open lesions or wounds are noted today multiple hyperkeratoses are noted with thick yellow dystrophic onychomycotic nails.  Assessment: Pain in limb secondary to onychomycosis bilateral. Diabetic peripheral neuropathy and porokeratosis bilateral.  Plan: Debridement of all reactive hyperkeratosis and debridement of toenails 1 through 5 bilateral.

## 2015-06-10 ENCOUNTER — Emergency Department (HOSPITAL_COMMUNITY): Payer: BC Managed Care – PPO

## 2015-06-10 ENCOUNTER — Encounter (HOSPITAL_COMMUNITY): Payer: Self-pay | Admitting: Emergency Medicine

## 2015-06-10 ENCOUNTER — Emergency Department (HOSPITAL_COMMUNITY)
Admission: EM | Admit: 2015-06-10 | Discharge: 2015-06-10 | Disposition: A | Discharge status: Home / Self Care | Payer: BC Managed Care – PPO | Attending: Emergency Medicine | Admitting: Emergency Medicine

## 2015-06-10 DIAGNOSIS — E785 Hyperlipidemia, unspecified: Secondary | ICD-10-CM | POA: Diagnosis not present

## 2015-06-10 DIAGNOSIS — R1013 Epigastric pain: Secondary | ICD-10-CM | POA: Diagnosis not present

## 2015-06-10 DIAGNOSIS — Z87891 Personal history of nicotine dependence: Secondary | ICD-10-CM | POA: Insufficient documentation

## 2015-06-10 DIAGNOSIS — Z9889 Other specified postprocedural states: Secondary | ICD-10-CM | POA: Diagnosis not present

## 2015-06-10 DIAGNOSIS — I25119 Atherosclerotic heart disease of native coronary artery with unspecified angina pectoris: Secondary | ICD-10-CM | POA: Insufficient documentation

## 2015-06-10 DIAGNOSIS — R0602 Shortness of breath: Secondary | ICD-10-CM | POA: Diagnosis not present

## 2015-06-10 DIAGNOSIS — R079 Chest pain, unspecified: Secondary | ICD-10-CM | POA: Diagnosis not present

## 2015-06-10 DIAGNOSIS — R110 Nausea: Secondary | ICD-10-CM | POA: Insufficient documentation

## 2015-06-10 DIAGNOSIS — Z7902 Long term (current) use of antithrombotics/antiplatelets: Secondary | ICD-10-CM | POA: Insufficient documentation

## 2015-06-10 DIAGNOSIS — Z992 Dependence on renal dialysis: Secondary | ICD-10-CM | POA: Diagnosis not present

## 2015-06-10 DIAGNOSIS — K219 Gastro-esophageal reflux disease without esophagitis: Secondary | ICD-10-CM | POA: Diagnosis not present

## 2015-06-10 DIAGNOSIS — Z8701 Personal history of pneumonia (recurrent): Secondary | ICD-10-CM | POA: Insufficient documentation

## 2015-06-10 DIAGNOSIS — N186 End stage renal disease: Secondary | ICD-10-CM | POA: Diagnosis not present

## 2015-06-10 DIAGNOSIS — Z79899 Other long term (current) drug therapy: Secondary | ICD-10-CM | POA: Diagnosis not present

## 2015-06-10 DIAGNOSIS — R1011 Right upper quadrant pain: Secondary | ICD-10-CM | POA: Insufficient documentation

## 2015-06-10 DIAGNOSIS — Z87442 Personal history of urinary calculi: Secondary | ICD-10-CM | POA: Insufficient documentation

## 2015-06-10 DIAGNOSIS — Z794 Long term (current) use of insulin: Secondary | ICD-10-CM | POA: Diagnosis not present

## 2015-06-10 DIAGNOSIS — I120 Hypertensive chronic kidney disease with stage 5 chronic kidney disease or end stage renal disease: Secondary | ICD-10-CM | POA: Insufficient documentation

## 2015-06-10 DIAGNOSIS — Z9861 Coronary angioplasty status: Secondary | ICD-10-CM | POA: Insufficient documentation

## 2015-06-10 DIAGNOSIS — E119 Type 2 diabetes mellitus without complications: Secondary | ICD-10-CM | POA: Diagnosis not present

## 2015-06-10 DIAGNOSIS — Z7982 Long term (current) use of aspirin: Secondary | ICD-10-CM | POA: Diagnosis not present

## 2015-06-10 DIAGNOSIS — R109 Unspecified abdominal pain: Secondary | ICD-10-CM

## 2015-06-10 DIAGNOSIS — G8929 Other chronic pain: Secondary | ICD-10-CM | POA: Insufficient documentation

## 2015-06-10 LAB — LIPASE, BLOOD: Lipase: 49 U/L (ref 11–51)

## 2015-06-10 LAB — BASIC METABOLIC PANEL
Anion gap: 13 (ref 5–15)
BUN: 37 mg/dL — ABNORMAL HIGH (ref 6–20)
CO2: 25 mmol/L (ref 22–32)
Calcium: 10.1 mg/dL (ref 8.9–10.3)
Chloride: 100 mmol/L — ABNORMAL LOW (ref 101–111)
Creatinine, Ser: 8.98 mg/dL — ABNORMAL HIGH (ref 0.61–1.24)
GFR calc Af Amer: 6 mL/min — ABNORMAL LOW (ref >60)
GFR calc non Af Amer: 5 mL/min — ABNORMAL LOW (ref >60)
Glucose, Bld: 109 mg/dL — ABNORMAL HIGH (ref 65–99)
Potassium: 4.3 mmol/L (ref 3.5–5.1)
Sodium: 138 mmol/L (ref 135–145)

## 2015-06-10 LAB — D-DIMER, QUANTITATIVE (NOT AT ARMC): D-Dimer, Quant: 0.51 ug/mL-FEU — ABNORMAL HIGH (ref 0.00–0.50)

## 2015-06-10 LAB — CBC
HCT: 36 % — ABNORMAL LOW (ref 39.0–52.0)
Hemoglobin: 11.6 g/dL — ABNORMAL LOW (ref 13.0–17.0)
MCH: 30.5 pg (ref 26.0–34.0)
MCHC: 32.2 g/dL (ref 30.0–36.0)
MCV: 94.7 fL (ref 78.0–100.0)
Platelets: 224 10*3/uL (ref 150–400)
RBC: 3.8 MIL/uL — ABNORMAL LOW (ref 4.22–5.81)
RDW: 15 % (ref 11.5–15.5)
WBC: 6.8 10*3/uL (ref 4.0–10.5)

## 2015-06-10 LAB — HEPATIC FUNCTION PANEL
ALT: 23 U/L (ref 17–63)
AST: 25 U/L (ref 15–41)
Albumin: 4 g/dL (ref 3.5–5.0)
Alkaline Phosphatase: 62 U/L (ref 38–126)
Bilirubin, Direct: 0.2 mg/dL (ref 0.1–0.5)
Indirect Bilirubin: 0.7 mg/dL (ref 0.3–0.9)
Total Bilirubin: 0.9 mg/dL (ref 0.3–1.2)
Total Protein: 8.1 g/dL (ref 6.5–8.1)

## 2015-06-10 LAB — I-STAT TROPONIN, ED: Troponin i, poc: 0.03 ng/mL (ref 0.00–0.08)

## 2015-06-10 MED ORDER — NITROGLYCERIN 2 % TD OINT
1.0000 [in_us] | TOPICAL_OINTMENT | Freq: Once | TRANSDERMAL | Status: AC
  Administered 2015-06-10: 1 [in_us] via TOPICAL
  Filled 2015-06-10: qty 1

## 2015-06-10 MED ORDER — FLEET ENEMA 7-19 GM/118ML RE ENEM: 1.0000 | ENEMA | Freq: Once | RECTAL | Status: DC

## 2015-06-10 MED ORDER — GI COCKTAIL ~~LOC~~
30.0000 mL | Freq: Once | ORAL | Status: AC
  Administered 2015-06-10: 30 mL via ORAL
  Filled 2015-06-10: qty 30

## 2015-06-10 MED ORDER — POLYETHYLENE GLYCOL 3350 17 G PO PACK: 17.0000 g | PACK | Freq: Every day | ORAL | Status: DC

## 2015-06-10 MED ORDER — SUCRALFATE 1 G PO TABS: 1.0000 g | ORAL_TABLET | Freq: Three times a day (TID) | ORAL | Status: DC

## 2015-06-10 MED ORDER — ONDANSETRON HCL 4 MG/2ML IJ SOLN
4.0000 mg | Freq: Once | INTRAMUSCULAR | Status: AC
  Administered 2015-06-10: 4 mg via INTRAVENOUS
  Filled 2015-06-10: qty 2

## 2015-06-10 MED ORDER — ASPIRIN 81 MG PO CHEW
324.0000 mg | CHEWABLE_TABLET | Freq: Once | ORAL | Status: AC
  Administered 2015-06-10: 324 mg via ORAL
  Filled 2015-06-10: qty 4

## 2015-06-10 MED ORDER — MORPHINE SULFATE (PF) 4 MG/ML IV SOLN
6.0000 mg | Freq: Once | INTRAVENOUS | Status: AC
Start: 2015-06-10 — End: 2015-06-10
  Administered 2015-06-10: 6 mg via INTRAVENOUS
  Filled 2015-06-10: qty 2

## 2015-06-10 MED ORDER — ALUM & MAG HYDROXIDE-SIMETH 200-200-20 MG/5ML PO SUSP
30.0000 mL | Freq: Once | ORAL | Status: AC
Start: 2015-06-10 — End: 2015-06-10
  Administered 2015-06-10: 30 mL via ORAL
  Filled 2015-06-10: qty 30

## 2015-06-10 MED ORDER — AZITHROMYCIN 250 MG PO TABS: 250.0000 mg | ORAL_TABLET | Freq: Every day | ORAL | Status: DC

## 2015-06-10 NOTE — ED Provider Notes (Signed)
CSN: [id number]     Arrival date & time 06/10/15  0520 History   First MD Initiated Contact with Patient 06/10/15 0601     Chief Complaint  Patient presents with  . Chest Pain  . Shortness of Breath     (Consider location/radiation/quality/duration/timing/severity/associated sxs/prior Treatment) HPI Jon Anderson is a 65 y.o. male with hx of HTN, CAD, GERD, CKD, DM, pneumonia, presents to ED with complaint of epigastric pain and chest pain. Symptoms began 3 days ago, reports symptoms are waxing and waning. Reports associated SOB and nausea. Pt reports hx of similar chest pain in the past. Reports was hospitalized for this a year ago, at that time unremarkable cardiac cath. Reports exertional symptoms. Denies cough or congestion. Denies fever, chills. Denies vomiting or diarrhea. No blood in stool or emesis.  Last dialysis yesterday.   Past Medical History  Diagnosis Date  . Hyperlipidemia   . HTN (hypertension)     echo 3/10: EF 60%, LAE  . CAD (coronary artery disease)     a. cath 3/10: oLAD 25%, mLAD 25%; CFX 25%; oDx 80% (small - tx. medically); pRCA 25%, mRCA 25%; OM3 20%;  b.  Myoview 4/11: EF 53%, no scar or ischemia   c. MV 2012 Nl perfusion, apical thinning.  No ischemia or scar.  EF 49%, appears greater by visual estimate.;  d.  Dob stress echo 12/13:  Negative Dob stress echo. There is no evidence of ischemia.  The LVF is normal.  . Chronic chest pain   . GERD (gastroesophageal reflux disease)   . Carotid stenosis     a. dopplers 05/2012:  [id number] RICA; [id number] LICA => repeat in 6 mos  . Snores     a. presumed OSA, pt has refused sleep eval in past.  . Peripheral arterial disease ([place])     high-grade distal left SFA by duplex ultrasound  . Irregular heart beats   . Anginal pain ([place])   . Type II diabetes mellitus ([place])   . Pneumonia   . Nephrolithiasis     "passed them all"  . CKD (chronic kidney disease), stage IV ([place])     a. 05/2012 Renal u/s: medical renal dzs;  b.  seen by [facility]   Past Surgical History  Procedure Laterality Date  . Angioplasty / stenting femoral Left 12/11/2013    dr berry  . Foot fracture surgery Right   . Colonoscopy w/ biopsies and polypectomy    . Lower extremity angiogram Left 12/11/2013    Procedure: LOWER EXTREMITY ANGIOGRAM;  Surgeon: Lorretta Harp, MD;  Location[facility] CATH LAB;  Service: Cardiovascular;  Laterality: Left;  . Av fistula placement Left 03/19/2014    Procedure: CREATION OF ARTERIOVENOUS (AV) FISTULA  LEFT UPPER ARM;  Surgeon: Mal Misty, MD;  Location: [facility];  Service: Vascular;  Laterality: Left;  . Tonsillectomy and adenoidectomy    . Fracture surgery    . Inguinal hernia repair Left   . Cardiac catheterization  2001 and 2010   . Left heart catheterization with coronary angiogram N/A 06/22/2014    Procedure: LEFT HEART CATHETERIZATION WITH CORONARY ANGIOGRAM;  Surgeon: Troy Sine, MD;  Location: [facility] CATH LAB;  Service: Cardiovascular;  Laterality: N/A;   Family History  Problem Relation Age of Onset  . Heart attack Sister     died @ 22  . Cancer Mother     died @ 50  . Diabetes Brother     deceased  Social History  Substance Use Topics  . Smoking status: Former Smoker -- 1.00 packs/day for 2 years    Types: Cigarettes  . Smokeless tobacco: Never Used     Comment: 06/16/2014 "quit smoking cigarettes in the early 1970's"  . Alcohol Use: No    Review of Systems  Constitutional: Negative for fever and chills.  Respiratory: Positive for chest tightness and shortness of breath. Negative for cough.   Cardiovascular: Positive for chest pain. Negative for palpitations and leg swelling.  Gastrointestinal: Positive for nausea and abdominal pain. Negative for vomiting, diarrhea and abdominal distention.  Genitourinary: Negative for dysuria, urgency, frequency and hematuria.  Musculoskeletal: Negative for myalgias, arthralgias, neck pain and neck stiffness.  Skin: Negative for rash.   Neurological: Negative for dizziness, weakness, light-headedness, numbness and headaches.  All other systems reviewed and are negative.     Allergies  Kiwi extract  Home Medications   Prior to Admission medications   Medication Sig Start Date End Date Taking? Authorizing Provider  aspirin EC 81 MG tablet Take 1 tablet (81 mg total) by mouth daily. 05/28/12  Yes Liliane Shi, PA-C  atorvastatin (LIPITOR) 40 MG tablet Take 1 tablet (40 mg total) by mouth daily at 6 PM. 12/13/13  Yes Brett Canales, PA-C  calcium acetate (PHOSLO) 667 MG capsule Take 2 capsules (1,334 mg total) by mouth 3 (three) times daily with meals. 06/23/14  Yes Domenic Polite, MD  cinacalcet (SENSIPAR) 60 MG tablet Take 60 mg by mouth daily.   Yes Historical Provider, MD  clonazePAM (KLONOPIN) 0.5 MG tablet TAKE 1 TABLET BY MOUTH AT BEDTIME AS NEEDED FOR CRAMPS 09/22/14  Yes Historical Provider, MD  clopidogrel (PLAVIX) 75 MG tablet Take 1 tablet (75 mg total) by mouth daily with breakfast. 12/13/13  Yes Brett Canales, PA-C  gabapentin (NEURONTIN) 100 MG capsule Take 100 mg by mouth 2 (two) times daily.  10/02/14  Yes Historical Provider, MD  HYDROcodone-acetaminophen (NORCO) 5-325 MG per tablet Take 1 tablet by mouth every 6 (six) hours as needed for moderate pain. 03/19/14  Yes Samantha J Rhyne, PA-C  multivitamin (RENA-VIT) TABS tablet Take 1 tablet by mouth at bedtime. 06/23/14  Yes Domenic Polite, MD  nitroGLYCERIN (NITROSTAT) 0.4 MG SL tablet Place 1 tablet (0.4 mg total) under the tongue every 5 (five) minutes as needed for chest pain. 03/20/12  Yes Rhonda G Barrett, PA-C  NOVOLOG 100 UNIT/ML injection USE AS DIRECTED (TRITRATE AS NEEDED) *MAX 50 UNITS A DAY* 08/28/14  Yes Historical Provider, MD  pantoprazole (PROTONIX) 40 MG tablet Take 40 mg by mouth daily.   Yes Historical Provider, MD  rOPINIRole (REQUIP) 0.5 MG tablet Take 0.5 mg by mouth at bedtime.   Yes Historical Provider, MD   BP 159/88 mmHg  Pulse 79   Temp(Src) 98.6 F (37 C) (Oral)  Resp 19  Ht 5\' 11"  (1.803 m)  Wt 88.451 kg  BMI 27.21 kg/m2  SpO2 100% Physical Exam  Constitutional: He is oriented to person, place, and time. He appears well-developed and well-nourished. No distress.  HENT:  Head: Normocephalic and atraumatic.  Eyes: Conjunctivae are normal.  Neck: Neck supple.  Cardiovascular: Normal rate, regular rhythm and normal heart sounds.   Pulmonary/Chest: Effort normal. No respiratory distress. He has no wheezes. He has no rales.  Abdominal: Soft. Bowel sounds are normal. He exhibits no distension. There is tenderness. There is no rebound.  Epigastric, RUQ tenderness  Musculoskeletal: He exhibits no edema.  Neurological: He is  alert and oriented to person, place, and time.  Skin: Skin is warm and dry.  Nursing note and vitals reviewed.   ED Course  Procedures (including critical care time) Labs Review Labs Reviewed  BASIC METABOLIC PANEL - Abnormal; Notable for the following:    Chloride 100 (*)    Glucose, Bld 109 (*)    BUN 37 (*)    Creatinine, Ser 8.98 (*)    GFR calc non Af Amer 5 (*)    GFR calc Af Amer 6 (*)    All other components within normal limits  CBC - Abnormal; Notable for the following:    RBC 3.80 (*)    Hemoglobin 11.6 (*)    HCT 36.0 (*)    All other components within normal limits  D-DIMER, QUANTITATIVE (NOT AT [facility]) - Abnormal; Notable for the following:    D-Dimer, Quant 0.51 (*)    All other components within normal limits  HEPATIC FUNCTION PANEL  LIPASE, BLOOD  I-STAT TROPOININ, ED    Imaging Review Dg Chest 2 View  06/10/2015  CLINICAL DATA:  Acute onset of left-sided chest pain and shortness of breath. Initial encounter. EXAM: CHEST  2 VIEW COMPARISON:  Chest radiograph performed 06/23/2014 FINDINGS: The lungs are well-aerated. There is mild elevation of the right hemidiaphragm. Minimal left midlung opacity could reflect mild pneumonia. There is no evidence of pleural effusion or  pneumothorax. The heart is normal in size; the mediastinal contour is within normal limits. No acute osseous abnormalities are seen. IMPRESSION: Mild elevation of the right hemidiaphragm. Minimal left midlung opacity could reflect mild pneumonia. Electronically Signed   By: Garald Balding M.D.   On: 06/10/2015 06:38   I have personally reviewed and evaluated these images and lab results as part of my medical decision-making.   EKG Interpretation   Date/Time:  Thursday June 10 2015 05:29:52 EST Ventricular Rate:  92 PR Interval:  145 QRS Duration: 100 QT Interval:  371 QTC Calculation: 459 R Axis:   14 Text Interpretation:  Sinus rhythm Probable anteroseptal infarct, recent  SImilar to prior Confirmed by HORTON  MD, COURTNEY ([id number]) on 06/10/2015  5:40:12 AM      MDM   Final diagnoses:  Abdominal pain   Pt with cardiac hx, presents with CP, epigastric pain, nausea, shortness of breath. Labs and CXR ordered.  Pain is atypical. Also ordered a ddimer to ro PE.    Pt reassessed. Now pain is localized more to epigastric and RUQ area and pt complaining of severe nausea. Will try morphine and zofran and gi cocktail. Will get abd [place]. Pt's ddimer age ajusted is negative. His trop and ECG with no acute changes, doubt ACS especially with clean cath 1 year ago. Will gt abdominal work up.    10:23 AM Pt feeling much better [place] abd negative. Xray showing possible constipation. Pt admits to recent problems with constipation. He admits that he is afraid to take laxatives because he has to go to dialysis. Will try enema and miralax at home. Will also treat with zithromax possible early pneumonia on CXR, however, i doubt it is pneumonia given no fever, chills, no cough.   Filed Vitals:   06/10/15 0800 06/10/15 0930 06/10/15 1029 06/10/15 1030  BP: 151/83 134/72 106/63 106/63  Pulse: 93 85  80  Temp:   97.5 F (36.4 C)   TempSrc:   Oral   Resp: 14 18 16    Height:      Weight:  SpO2: 97% 90%  96% 93%     Jeannett Senior, PA-C 06/10/15 1525  Merryl Hacker, MD 06/13/15 2258

## 2015-06-10 NOTE — Discharge Instructions (Signed)
Take miralax daily. Take double dose today. Use enema today. Take carafate with meals to help with pain. Follow up with primary care doctor. Return if worsening symptoms.   Gastroesophageal Reflux Disease, Adult Normally, food travels down the esophagus and stays in the stomach to be digested. However, when a person has gastroesophageal reflux disease (GERD), food and stomach acid move back up into the esophagus. When this happens, the esophagus becomes sore and inflamed. Over time, GERD can create small holes (ulcers) in the lining of the esophagus.  CAUSES This condition is caused by a problem with the muscle between the esophagus and the stomach (lower esophageal sphincter, or LES). Normally, the LES muscle closes after food passes through the esophagus to the stomach. When the LES is weakened or abnormal, it does not close properly, and that allows food and stomach acid to go back up into the esophagus. The LES can be weakened by certain dietary substances, medicines, and medical conditions, including:  Tobacco use.  Pregnancy.  Having a hiatal hernia.  Heavy alcohol use.  Certain foods and beverages, such as coffee, chocolate, onions, and peppermint. RISK FACTORS This condition is more likely to develop in:  People who have an increased body weight.  People who have connective tissue disorders.  People who use NSAID medicines. SYMPTOMS Symptoms of this condition include:  Heartburn.  Difficult or painful swallowing.  The feeling of having a lump in the throat.  Abitter taste in the mouth.  Bad breath.  Having a large amount of saliva.  Having an upset or bloated stomach.  Belching.  Chest pain.  Shortness of breath or wheezing.  Ongoing (chronic) cough or a night-time cough.  Wearing away of tooth enamel.  Weight loss. Different conditions can cause chest pain. Make sure to see your health care provider if you experience chest pain. DIAGNOSIS Your health  care provider will take a medical history and perform a physical exam. To determine if you have mild or severe GERD, your health care provider may also monitor how you respond to treatment. You may also have other tests, including:  An endoscopy toexamine your stomach and esophagus with a small camera.  A test thatmeasures the acidity level in your esophagus.  A test thatmeasures how much pressure is on your esophagus.  A barium swallow or modified barium swallow to show the shape, size, and functioning of your esophagus. TREATMENT The goal of treatment is to help relieve your symptoms and to prevent complications. Treatment for this condition may vary depending on how severe your symptoms are. Your health care provider may recommend:  Changes to your diet.  Medicine.  Surgery. HOME CARE INSTRUCTIONS Diet  Follow a diet as recommended by your health care provider. This may involve avoiding foods and drinks such as:  Coffee and tea (with or without caffeine).  Drinks that containalcohol.  Energy drinks and sports drinks.  Carbonated drinks or sodas.  Chocolate and cocoa.  Peppermint and mint flavorings.  Garlic and onions.  Horseradish.  Spicy and acidic foods, including peppers, chili powder, curry powder, vinegar, hot sauces, and barbecue sauce.  Citrus fruit juices and citrus fruits, such as oranges, lemons, and limes.  Tomato-based foods, such as red sauce, chili, salsa, and pizza with red sauce.  Fried and fatty foods, such as donuts, french fries, potato chips, and high-fat dressings.  High-fat meats, such as hot dogs and fatty cuts of red and white meats, such as rib eye steak, sausage, ham, and bacon.  High-fat dairy items, such as whole milk, butter, and cream cheese.  Eat small, frequent meals instead of large meals.  Avoid drinking large amounts of liquid with your meals.  Avoid eating meals during the 2-3 hours before bedtime.  Avoid lying down  right after you eat.  Do not exercise right after you eat. General Instructions  Pay attention to any changes in your symptoms.  Take over-the-counter and prescription medicines only as told by your health care provider. Do not take aspirin, ibuprofen, or other NSAIDs unless your health care provider told you to do so.  Do not use any tobacco products, including cigarettes, chewing tobacco, and e-cigarettes. If you need help quitting, ask your health care provider.  Wear loose-fitting clothing. Do not wear anything tight around your waist that causes pressure on your abdomen.  Raise (elevate) the head of your bed 6 inches (15cm).  Try to reduce your stress, such as with yoga or meditation. If you need help reducing stress, ask your health care provider.  If you are overweight, reduce your weight to an amount that is healthy for you. Ask your health care provider for guidance about a safe weight loss goal.  Keep all follow-up visits as told by your health care provider. This is important. SEEK MEDICAL CARE IF:  You have new symptoms.  You have unexplained weight loss.  You have difficulty swallowing, or it hurts to swallow.  You have wheezing or a persistent cough.  Your symptoms do not improve with treatment.  You have a hoarse voice. SEEK IMMEDIATE MEDICAL CARE IF:  You have pain in your arms, neck, jaw, teeth, or back.  You feel sweaty, dizzy, or light-headed.  You have chest pain or shortness of breath.  You vomit and your vomit looks like blood or coffee grounds.  You faint.  Your stool is bloody or black.  You cannot swallow, drink, or eat.   This information is not intended to replace advice given to you by your health care provider. Make sure you discuss any questions you have with your health care provider.   Document Released: 01/04/2005 Document Revised: 12/16/2014 Document Reviewed: 07/22/2014 Elsevier Interactive Patient Education 2016 [employer].  Constipation, Adult Constipation is when a person has fewer than three bowel movements a week, has difficulty having a bowel movement, or has stools that are dry, hard, or larger than normal. As people grow older, constipation is more common. A low-fiber diet, not taking in enough fluids, and taking certain medicines may make constipation worse.  CAUSES   Certain medicines, such as antidepressants, pain medicine, iron supplements, antacids, and water pills.   Certain diseases, such as diabetes, irritable bowel syndrome (IBS), thyroid disease, or depression.   Not drinking enough water.   Not eating enough fiber-rich foods.   Stress or travel.   Lack of physical activity or exercise.   Ignoring the urge to have a bowel movement.   Using laxatives too much.  SIGNS AND SYMPTOMS   Having fewer than three bowel movements a week.   Straining to have a bowel movement.   Having stools that are hard, dry, or larger than normal.   Feeling full or bloated.   Pain in the lower abdomen.   Not feeling relief after having a bowel movement.  DIAGNOSIS  Your health care provider will take a medical history and perform a physical exam. Further testing may be done for severe constipation. Some tests may include:  A barium enema X-ray  to examine your rectum, colon, and, sometimes, your small intestine.   A sigmoidoscopy to examine your lower colon.   A colonoscopy to examine your entire colon. TREATMENT  Treatment will depend on the severity of your constipation and what is causing it. Some dietary treatments include drinking more fluids and eating more fiber-rich foods. Lifestyle treatments may include regular exercise. If these diet and lifestyle recommendations do not help, your health care provider may recommend taking over-the-counter laxative medicines to help you have bowel movements. Prescription medicines may be prescribed if over-the-counter medicines do not  work.  HOME CARE INSTRUCTIONS   Eat foods that have a lot of fiber, such as fruits, vegetables, whole grains, and beans.  Limit foods high in fat and processed sugars, such as french fries, hamburgers, cookies, candies, and soda.   A fiber supplement may be added to your diet if you cannot get enough fiber from foods.   Drink enough fluids to keep your urine clear or pale yellow.   Exercise regularly or as directed by your health care provider.   Go to the restroom when you have the urge to go. Do not hold it.   Only take over-the-counter or prescription medicines as directed by your health care provider. Do not take other medicines for constipation without talking to your health care provider first.  [place] IF:   You have bright red blood in your stool.   Your constipation lasts for more than 4 days or gets worse.   You have abdominal or rectal pain.   You have thin, pencil-like stools.   You have unexplained weight loss. MAKE SURE YOU:   Understand these instructions.  Will watch your condition.  Will get help right away if you are not doing well or get worse.   This information is not intended to replace advice given to you by your health care provider. Make sure you discuss any questions you have with your health care provider.   Document Released: 12/24/2003 Document Revised: 04/17/2014 Document Reviewed: 01/06/2013 Elsevier Interactive Patient Education [employer].

## 2015-06-10 NOTE — ED Provider Notes (Signed)
MSE was initiated and I personally evaluated the patient and placed orders (if any) at  5:41 AM on June 10, 2015.  The patient appears stable so that the remainder of the MSE may be completed by another provider.  Patient presents with chest pain. 3 days of ongoing waxing and waning chest pain, shortness of breath, and nausea. Has had similar chest pain in the past. States is anterior. Currently 5 out of 10. States "I get short of breath sometimes when I even time a shoes." No risk factors for PE. He had a normal cardiac catheterization 1 year ago. Current chest pain has been ongoing for one hour. He is nontoxic on exam. No acute distress. Vital signs are reassuring. He is currently on dialysis and dialyzed Monday Wednesday and Friday. Last dialyzed on Wednesday. Lab work obtained. EKG without any significant changes from prior. He has normal anterior ST elevation which is likely early re-pol given prior EKGs.   EKG Interpretation  Date/Time:  Thursday June 10 2015 05:29:52 EST Ventricular Rate:  92 PR Interval:  145 QRS Duration: 100 QT Interval:  371 QTC Calculation: 459 R Axis:   14 Text Interpretation:  Sinus rhythm Probable anteroseptal infarct, recent SImilar to prior Confirmed by HORTON  MD, COURTNEY ([id number]) on 06/10/2015 5:40:12 AM        Merryl Hacker, MD 06/10/15 [phone]

## 2015-06-10 NOTE — ED Notes (Signed)
Pt arrives with chest pain ongoing 3 days, c/o shob and nausea/vomiting. States pressure like pain, 5/10, no radiation. Hx dialysis, MWF schedule, no missed tx.

## 2015-07-06 ENCOUNTER — Other Ambulatory Visit: Payer: Self-pay | Admitting: Internal Medicine

## 2015-07-06 DIAGNOSIS — I739 Peripheral vascular disease, unspecified: Secondary | ICD-10-CM

## 2015-07-06 DIAGNOSIS — I2510 Atherosclerotic heart disease of native coronary artery without angina pectoris: Secondary | ICD-10-CM

## 2015-07-06 DIAGNOSIS — E119 Type 2 diabetes mellitus without complications: Secondary | ICD-10-CM

## 2015-07-12 ENCOUNTER — Ambulatory Visit
Admission: RE | Admit: 2015-07-12 | Discharge: 2015-07-12 | Disposition: A | Discharge status: Home / Self Care | Payer: BC Managed Care – PPO | Source: Ambulatory Visit | Attending: Internal Medicine | Admitting: Internal Medicine

## 2015-07-12 DIAGNOSIS — E119 Type 2 diabetes mellitus without complications: Secondary | ICD-10-CM

## 2015-07-12 DIAGNOSIS — I2510 Atherosclerotic heart disease of native coronary artery without angina pectoris: Secondary | ICD-10-CM

## 2015-07-12 DIAGNOSIS — I739 Peripheral vascular disease, unspecified: Secondary | ICD-10-CM

## 2015-07-21 ENCOUNTER — Other Ambulatory Visit: Payer: Self-pay | Admitting: Internal Medicine

## 2015-07-21 DIAGNOSIS — R1310 Dysphagia, unspecified: Secondary | ICD-10-CM

## 2015-07-27 ENCOUNTER — Ambulatory Visit
Admission: RE | Admit: 2015-07-27 | Discharge: 2015-07-27 | Disposition: A | Discharge status: Home / Self Care | Payer: BC Managed Care – PPO | Source: Ambulatory Visit | Attending: Internal Medicine | Admitting: Internal Medicine

## 2015-07-27 DIAGNOSIS — R1310 Dysphagia, unspecified: Secondary | ICD-10-CM

## 2015-08-24 ENCOUNTER — Encounter: Payer: Self-pay | Admitting: Podiatry

## 2015-08-24 ENCOUNTER — Ambulatory Visit (INDEPENDENT_AMBULATORY_CARE_PROVIDER_SITE_OTHER): Payer: BC Managed Care – PPO | Admitting: Podiatry

## 2015-08-24 DIAGNOSIS — Q828 Other specified congenital malformations of skin: Secondary | ICD-10-CM

## 2015-08-24 DIAGNOSIS — M79676 Pain in unspecified toe(s): Secondary | ICD-10-CM

## 2015-08-24 DIAGNOSIS — B351 Tinea unguium: Secondary | ICD-10-CM

## 2015-08-24 DIAGNOSIS — E1142 Type 2 diabetes mellitus with diabetic polyneuropathy: Secondary | ICD-10-CM | POA: Diagnosis not present

## 2015-08-24 NOTE — Progress Notes (Signed)
He presents today with chief complaint of painful elongated toenails corns and calluses bilateral.  Objective: Vital signs stable and oriented 3. Toenails are thickened and dystrophic onychomycotic some fifth metatarsal head porokeratosis bilaterally. No open lesions or wounds noted.  Assessment: Diabetes mellitus pain in limb secondary to onychomycosis. Porokeratosis of fifth metatarsal bilateral.  Plan: Debridement of all reactive hyperkeratotic tissue and debridement of toenails 1 through 5 bilateral.

## 2015-08-25 ENCOUNTER — Emergency Department (HOSPITAL_COMMUNITY)
Admission: EM | Admit: 2015-08-25 | Discharge: 2015-08-25 | Disposition: A | Discharge status: Home / Self Care | Payer: BC Managed Care – PPO | Attending: Emergency Medicine | Admitting: Emergency Medicine

## 2015-08-25 ENCOUNTER — Emergency Department (HOSPITAL_COMMUNITY): Payer: BC Managed Care – PPO

## 2015-08-25 ENCOUNTER — Encounter (HOSPITAL_COMMUNITY): Payer: Self-pay | Admitting: Emergency Medicine

## 2015-08-25 DIAGNOSIS — I2510 Atherosclerotic heart disease of native coronary artery without angina pectoris: Secondary | ICD-10-CM | POA: Insufficient documentation

## 2015-08-25 DIAGNOSIS — Z7902 Long term (current) use of antithrombotics/antiplatelets: Secondary | ICD-10-CM | POA: Diagnosis not present

## 2015-08-25 DIAGNOSIS — E785 Hyperlipidemia, unspecified: Secondary | ICD-10-CM | POA: Diagnosis not present

## 2015-08-25 DIAGNOSIS — Z79899 Other long term (current) drug therapy: Secondary | ICD-10-CM | POA: Diagnosis not present

## 2015-08-25 DIAGNOSIS — K219 Gastro-esophageal reflux disease without esophagitis: Secondary | ICD-10-CM | POA: Insufficient documentation

## 2015-08-25 DIAGNOSIS — Z87891 Personal history of nicotine dependence: Secondary | ICD-10-CM | POA: Insufficient documentation

## 2015-08-25 DIAGNOSIS — E1122 Type 2 diabetes mellitus with diabetic chronic kidney disease: Secondary | ICD-10-CM | POA: Diagnosis not present

## 2015-08-25 DIAGNOSIS — I739 Peripheral vascular disease, unspecified: Secondary | ICD-10-CM | POA: Insufficient documentation

## 2015-08-25 DIAGNOSIS — Z992 Dependence on renal dialysis: Secondary | ICD-10-CM | POA: Insufficient documentation

## 2015-08-25 DIAGNOSIS — R079 Chest pain, unspecified: Secondary | ICD-10-CM | POA: Diagnosis present

## 2015-08-25 DIAGNOSIS — Z794 Long term (current) use of insulin: Secondary | ICD-10-CM | POA: Insufficient documentation

## 2015-08-25 DIAGNOSIS — Z792 Long term (current) use of antibiotics: Secondary | ICD-10-CM | POA: Insufficient documentation

## 2015-08-25 DIAGNOSIS — Z79891 Long term (current) use of opiate analgesic: Secondary | ICD-10-CM | POA: Insufficient documentation

## 2015-08-25 DIAGNOSIS — N186 End stage renal disease: Secondary | ICD-10-CM | POA: Insufficient documentation

## 2015-08-25 DIAGNOSIS — Z7982 Long term (current) use of aspirin: Secondary | ICD-10-CM | POA: Insufficient documentation

## 2015-08-25 DIAGNOSIS — I120 Hypertensive chronic kidney disease with stage 5 chronic kidney disease or end stage renal disease: Secondary | ICD-10-CM | POA: Diagnosis not present

## 2015-08-25 LAB — COMPREHENSIVE METABOLIC PANEL
ALT: 22 U/L (ref 17–63)
AST: 27 U/L (ref 15–41)
Albumin: 3.6 g/dL (ref 3.5–5.0)
Alkaline Phosphatase: 60 U/L (ref 38–126)
Anion gap: 18 — ABNORMAL HIGH (ref 5–15)
BUN: 39 mg/dL — ABNORMAL HIGH (ref 6–20)
CO2: 25 mmol/L (ref 22–32)
Calcium: 8.4 mg/dL — ABNORMAL LOW (ref 8.9–10.3)
Chloride: 92 mmol/L — ABNORMAL LOW (ref 101–111)
Creatinine, Ser: 11.72 mg/dL — ABNORMAL HIGH (ref 0.61–1.24)
GFR calc Af Amer: 5 mL/min — ABNORMAL LOW (ref >60)
GFR calc non Af Amer: 4 mL/min — ABNORMAL LOW (ref >60)
Glucose, Bld: 94 mg/dL (ref 65–99)
Potassium: 4 mmol/L (ref 3.5–5.1)
Sodium: 135 mmol/L (ref 135–145)
Total Bilirubin: 0.6 mg/dL (ref 0.3–1.2)
Total Protein: 6.8 g/dL (ref 6.5–8.1)

## 2015-08-25 LAB — CBC WITH DIFFERENTIAL/PLATELET
Basophils Absolute: 0 10*3/uL (ref 0.0–0.1)
Basophils Relative: 0 %
Eosinophils Absolute: 0.3 10*3/uL (ref 0.0–0.7)
Eosinophils Relative: 5 %
HCT: 33.6 % — ABNORMAL LOW (ref 39.0–52.0)
Hemoglobin: 11.3 g/dL — ABNORMAL LOW (ref 13.0–17.0)
Lymphocytes Relative: 47 %
Lymphs Abs: 3.3 10*3/uL (ref 0.7–4.0)
MCH: 32 pg (ref 26.0–34.0)
MCHC: 33.6 g/dL (ref 30.0–36.0)
MCV: 95.2 fL (ref 78.0–100.0)
Monocytes Absolute: 0.6 10*3/uL (ref 0.1–1.0)
Monocytes Relative: 9 %
Neutro Abs: 2.6 10*3/uL (ref 1.7–7.7)
Neutrophils Relative %: 39 %
Platelets: 201 10*3/uL (ref 150–400)
RBC: 3.53 MIL/uL — ABNORMAL LOW (ref 4.22–5.81)
RDW: 13.8 % (ref 11.5–15.5)
WBC: 6.8 10*3/uL (ref 4.0–10.5)

## 2015-08-25 LAB — I-STAT TROPONIN, ED: Troponin i, poc: 0.04 ng/mL (ref 0.00–0.08)

## 2015-08-25 MED ORDER — ONDANSETRON HCL 4 MG/2ML IJ SOLN
4.0000 mg | Freq: Once | INTRAMUSCULAR | Status: AC
  Administered 2015-08-25: 4 mg via INTRAVENOUS
  Filled 2015-08-25: qty 2

## 2015-08-25 MED ORDER — MORPHINE SULFATE (PF) 4 MG/ML IV SOLN
4.0000 mg | Freq: Once | INTRAVENOUS | Status: AC
Start: 2015-08-25 — End: 2015-08-25
  Administered 2015-08-25: 4 mg via INTRAVENOUS
  Filled 2015-08-25: qty 1

## 2015-08-25 NOTE — ED Provider Notes (Signed)
CSN: [id number]     Arrival date & time 08/25/15  0608 History   First MD Initiated Contact with Patient 08/25/15 [phone]     Chief Complaint  Patient presents with  . Chest Pain  . Shortness of Breath   HPI   Jon Anderson is an 65 y.o. male with history of ESRD (dialysis M/W/F schedule, due today), nonobstructive CAD, HTN, HLD, DM,  anemia of chronic disease, and PVD who presents to the ED for evaluation of chest pain and SOB. He states the pain started three days ago and is waxing/waning in severity. States the pain starts in his left chest and radiates around to his back. He reports the pain is constant, sharp. He states this pain is similar to chest pain he has had before. He states it is better sitting up and worse laying flat. He does endorse associated shortness of breath, particularly when the pain is intense. He has not tried any medication to alleviate his symptoms. He states he has been taking his home meds as prescribed including ASA and plavix. States he did not miss any dialysis sessions. Denies new lower extremity edema. Denies diaphoresis, fever, chills. Endorses intermittent nausea but denies emesis. Denies abdominal pain or diarrhea. States he typically goes to [facility] on [address]. For dialysis.  Per EMR review: Cath 06/2014 with normal coronaries and normal LV function, LVEF >55% Cardiologist: Dr. Gwenlyn Found (though pt has not seen in clinic recently).  Past Medical History  Diagnosis Date  . Hyperlipidemia   . HTN (hypertension)     echo 3/10: EF 60%, LAE  . CAD (coronary artery disease)     a. cath 3/10: oLAD 25%, mLAD 25%; CFX 25%; oDx 80% (small - tx. medically); pRCA 25%, mRCA 25%; OM3 20%;  b.  Myoview 4/11: EF 53%, no scar or ischemia   c. MV 2012 Nl perfusion, apical thinning.  No ischemia or scar.  EF 49%, appears greater by visual estimate.;  d.  Dob stress echo 12/13:  Negative Dob stress echo. There is no evidence of ischemia.  The LVF is normal.  . Chronic chest  pain   . GERD (gastroesophageal reflux disease)   . Carotid stenosis     a. dopplers 05/2012:  [id number] RICA; [id number] LICA => repeat in 6 mos  . Snores     a. presumed OSA, pt has refused sleep eval in past.  . Peripheral arterial disease ([place])     high-grade distal left SFA by duplex ultrasound  . Irregular heart beats   . Anginal pain ([place])   . Type II diabetes mellitus ([place])   . Pneumonia   . Nephrolithiasis     "passed them all"  . CKD (chronic kidney disease), stage IV ([place])     a. 05/2012 Renal u/s: medical renal dzs;  b. seen by [facility]   Past Surgical History  Procedure Laterality Date  . Angioplasty / stenting femoral Left 12/11/2013    dr berry  . Foot fracture surgery Right   . Colonoscopy w/ biopsies and polypectomy    . Lower extremity angiogram Left 12/11/2013    Procedure: LOWER EXTREMITY ANGIOGRAM;  Surgeon: Lorretta Harp, MD;  Location: [facility] CATH LAB;  Service: Cardiovascular;  Laterality: Left;  . Av fistula placement Left 03/19/2014    Procedure: CREATION OF ARTERIOVENOUS (AV) FISTULA  LEFT UPPER ARM;  Surgeon: Mal Misty, MD;  Location: [facility];  Service: Vascular;  Laterality: Left;  . Tonsillectomy and adenoidectomy    .  Fracture surgery    . Inguinal hernia repair Left   . Cardiac catheterization  2001 and 2010   . Left heart catheterization with coronary angiogram N/A 06/22/2014    Procedure: LEFT HEART CATHETERIZATION WITH CORONARY ANGIOGRAM;  Surgeon: Troy Sine, MD;  Location: [facility] CATH LAB;  Service: Cardiovascular;  Laterality: N/A;   Family History  Problem Relation Age of Onset  . Heart attack Sister     died @ 46  . Cancer Mother     died @ 33  . Diabetes Brother     deceased   Social History  Substance Use Topics  . Smoking status: Former Smoker -- 1.00 packs/day for 2 years    Types: Cigarettes  . Smokeless tobacco: Never Used     Comment: 06/16/2014 "quit smoking cigarettes in the early 1970's"  . Alcohol Use: No    Review of  Systems  All other systems reviewed and are negative.     Allergies  Kiwi extract  Home Medications   Prior to Admission medications   Medication Sig Start Date End Date Taking? Authorizing Provider  aspirin EC 81 MG tablet Take 1 tablet (81 mg total) by mouth daily. 05/28/12   Liliane Shi, PA-C  atorvastatin (LIPITOR) 40 MG tablet Take 1 tablet (40 mg total) by mouth daily at 6 PM. 12/13/13   Brett Canales, PA-C  azithromycin (ZITHROMAX) 250 MG tablet Take 1 tablet (250 mg total) by mouth daily. Take first 2 tablets together, then 1 every day until finished. 06/10/15   Tatyana Kirichenko, PA-C  calcium acetate (PHOSLO) 667 MG capsule Take 2 capsules (1,334 mg total) by mouth 3 (three) times daily with meals. 06/23/14   Domenic Polite, MD  cinacalcet (SENSIPAR) 60 MG tablet Take 60 mg by mouth daily.    Historical Provider, MD  clonazePAM (KLONOPIN) 0.5 MG tablet TAKE 1 TABLET BY MOUTH AT BEDTIME AS NEEDED FOR CRAMPS 09/22/14   Historical Provider, MD  clopidogrel (PLAVIX) 75 MG tablet Take 1 tablet (75 mg total) by mouth daily with breakfast. 12/13/13   Brett Canales, PA-C  gabapentin (NEURONTIN) 100 MG capsule Take 100 mg by mouth 2 (two) times daily.  10/02/14   Historical Provider, MD  HYDROcodone-acetaminophen (NORCO) 5-325 MG per tablet Take 1 tablet by mouth every 6 (six) hours as needed for moderate pain. 03/19/14   Samantha J Rhyne, PA-C  multivitamin (RENA-VIT) TABS tablet Take 1 tablet by mouth at bedtime. 06/23/14   Domenic Polite, MD  nitroGLYCERIN (NITROSTAT) 0.4 MG SL tablet Place 1 tablet (0.4 mg total) under the tongue every 5 (five) minutes as needed for chest pain. 03/20/12   Rhonda G Barrett, PA-C  NOVOLOG 100 UNIT/ML injection USE AS DIRECTED (TRITRATE AS NEEDED) *MAX 50 UNITS A DAY* 08/28/14   Historical Provider, MD  pantoprazole (PROTONIX) 40 MG tablet Take 40 mg by mouth daily.    Historical Provider, MD  polyethylene glycol (MIRALAX) packet Take 17 g by mouth daily. 06/10/15    Tatyana Kirichenko, PA-C  rOPINIRole (REQUIP) 0.5 MG tablet Take 0.5 mg by mouth at bedtime.    Historical Provider, MD  sodium phosphate (FLEET) 7-19 GM/118ML ENEM Place 133 mLs (1 enema total) rectally once. 06/10/15   Tatyana Kirichenko, PA-C  sucralfate (CARAFATE) 1 g tablet Take 1 tablet (1 g total) by mouth 4 (four) times daily -  with meals and at bedtime. 06/10/15   Tatyana Kirichenko, PA-C   BP 145/85 mmHg  Pulse 91  Temp(Src) 98.2 F (36.8 C) (Oral)  Resp 17  SpO2 96% Physical Exam  Constitutional: He is oriented to person, place, and time.  HENT:  Right Ear: External ear normal.  Left Ear: External ear normal.  Nose: Nose normal.  Mouth/Throat: Oropharynx is clear and moist. No oropharyngeal exudate.  Eyes: Conjunctivae and EOM are normal. Pupils are equal, round, and reactive to light.  Neck: Normal range of motion. Neck supple.  Cardiovascular: Normal rate, regular rhythm, normal heart sounds and intact distal pulses.   Pulmonary/Chest: Effort normal and breath sounds normal. No respiratory distress. He has no wheezes.  No increased wob, no tachypnea, no hypoxia on room air. Lungs CTAB.   Abdominal: Soft. Bowel sounds are normal. He exhibits no distension. There is no tenderness.  Musculoskeletal: He exhibits no edema.  No LE edema  Left AV fistula in place, +thrill  Neurological: He is alert and oriented to person, place, and time. No cranial nerve deficit.  Skin: Skin is warm and dry.  Psychiatric: He has a normal mood and affect.  Nursing note and vitals reviewed.   ED Course  Procedures (including critical care time) Labs Review Labs Reviewed  COMPREHENSIVE METABOLIC PANEL - Abnormal; Notable for the following:    Chloride 92 (*)    BUN 39 (*)    Creatinine, Ser 11.72 (*)    Calcium 8.4 (*)    GFR calc non Af Amer 4 (*)    GFR calc Af Amer 5 (*)    Anion gap 18 (*)    All other components within normal limits  CBC WITH DIFFERENTIAL/PLATELET - Abnormal;  Notable for the following:    RBC 3.53 (*)    Hemoglobin 11.3 (*)    HCT 33.6 (*)    All other components within normal limits  I-STAT TROPOININ, ED    Imaging Review Dg Chest 2 View  08/25/2015  CLINICAL DATA:  Intermittent chest pain beginning 3 days ago. Shortness of breath today. EXAM: CHEST  2 VIEW COMPARISON:  06/10/2015 FINDINGS: Cardiomediastinal silhouette is within normal limits. Thoracic aortic atherosclerosis is again noted. Mild elevation of the right hemidiaphragm is unchanged. The patient has taken a shallower inspiration than on the prior study there is mildly increased density in both lung bases. No pleural effusion or pneumothorax is identified. No acute osseous abnormality is seen. IMPRESSION: Hypoinflation with mild bibasilar opacity, likely atelectasis though early infection is not excluded. Electronically Signed   By: Logan Bores M.D.   On: 08/25/2015 07:29   I have personally reviewed and evaluated these images and lab results as part of my medical decision-making.   EKG Interpretation   Date/Time:  Wednesday Aug 25 2015 06:21:34 EDT Ventricular Rate:  89 PR Interval:  138 QRS Duration: 96 QT Interval:  380 QTC Calculation: 462 R Axis:   20 Text Interpretation:  Sinus rhythm Probable anteroseptal infarct, recent  Confirmed by Hazle Coca [phone]) on 08/25/2015 6:25:16 AM      MDM   Final diagnoses:  Chest pain, unspecified chest pain type    HEART score 4 based on age and risk factors. EKG similar to prior. On reassessment pt reports chest pain is resolved. He is resting comfortably in room. Denies pain or SOB. He remains nontoxic appearing with no hypoxia on room air, no increased WOB. CXR with likely bilateral atelectasis, though cannot exclude early infection. No widened mediastinum or other abnormality. Pt afebrile, denies cough/URI symptoms, and no white count. Will not treat with abx  at this time. Discussed case with attending Dr. Rogene Houston. Doubt ACS  given 2-3 days of constant chest pain with negative trop and EKG unchanged from prior. Doubt PE. Wells score 0. Doubt dissection. Instructed close PCP f/u. Instructed to call cardiology to schedule f/u. I also spoke with [facility] and they will call pt to re-schedule dialysis for later today. ER return precautions given.     Anne Ng, [place] 08/25/15 [phone]

## 2015-08-25 NOTE — ED Notes (Addendum)
Patient with two day history of chest pain with shortness of breath.  Patient states that he has been nauseated, vomiting with the chest pain.  Patient denies any diarrhea.  Patient has had this pain before.  He states that the pain is central chest.  Patient is a dialysis patient, due this morning.

## 2015-08-25 NOTE — ED Notes (Signed)
Patient transported to X-ray 

## 2015-08-25 NOTE — ED Provider Notes (Signed)
Medical screening examination/treatment/procedure(s) were conducted as a shared visit with non-physician practitioner(s) and myself.  I personally evaluated the patient during the encounter.   EKG Interpretation   Date/Time:  Wednesday Aug 25 2015 06:21:34 EDT Ventricular Rate:  89 PR Interval:  138 QRS Duration: 96 QT Interval:  380 QTC Calculation: 462 R Axis:   20 Text Interpretation:  Sinus rhythm Probable anteroseptal infarct, recent  Confirmed by Hazle Coca [phone]) on 08/25/2015 6:25:16 AM      Results for orders placed or performed during the hospital encounter of 08/25/15  Comprehensive metabolic panel  Result Value Ref Range   Sodium 135 135 - 145 mmol/L   Potassium 4.0 3.5 - 5.1 mmol/L   Chloride 92 (L) 101 - 111 mmol/L   CO2 25 22 - 32 mmol/L   Glucose, Bld 94 65 - 99 mg/dL   BUN 39 (H) 6 - 20 mg/dL   Creatinine, Ser 11.72 (H) 0.61 - 1.24 mg/dL   Calcium 8.4 (L) 8.9 - 10.3 mg/dL   Total Protein 6.8 6.5 - 8.1 g/dL   Albumin 3.6 3.5 - 5.0 g/dL   AST 27 15 - 41 U/L   ALT 22 17 - 63 U/L   Alkaline Phosphatase 60 38 - 126 U/L   Total Bilirubin 0.6 0.3 - 1.2 mg/dL   GFR calc non Af Amer 4 (L) >60 mL/min   GFR calc Af Amer 5 (L) >60 mL/min   Anion gap 18 (H) 5 - 15  CBC with Differential  Result Value Ref Range   WBC 6.8 4.0 - 10.5 K/uL   RBC 3.53 (L) 4.22 - 5.81 MIL/uL   Hemoglobin 11.3 (L) 13.0 - 17.0 g/dL   HCT 33.6 (L) 39.0 - 52.0 %   MCV 95.2 78.0 - 100.0 fL   MCH 32.0 26.0 - 34.0 pg   MCHC 33.6 30.0 - 36.0 g/dL   RDW 13.8 11.5 - 15.5 %   Platelets 201 150 - 400 K/uL   Neutrophils Relative % 39 %   Neutro Abs 2.6 1.7 - 7.7 K/uL   Lymphocytes Relative 47 %   Lymphs Abs 3.3 0.7 - 4.0 K/uL   Monocytes Relative 9 %   Monocytes Absolute 0.6 0.1 - 1.0 K/uL   Eosinophils Relative 5 %   Eosinophils Absolute 0.3 0.0 - 0.7 K/uL   Basophils Relative 0 %   Basophils Absolute 0.0 0.0 - 0.1 K/uL  I-stat troponin, ED  Result Value Ref Range   Troponin i, poc 0.04  0.00 - 0.08 ng/mL   Comment 3           Dg Chest 2 View  08/25/2015  CLINICAL DATA:  Intermittent chest pain beginning 3 days ago. Shortness of breath today. EXAM: CHEST  2 VIEW COMPARISON:  06/10/2015 FINDINGS: Cardiomediastinal silhouette is within normal limits. Thoracic aortic atherosclerosis is again noted. Mild elevation of the right hemidiaphragm is unchanged. The patient has taken a shallower inspiration than on the prior study there is mildly increased density in both lung bases. No pleural effusion or pneumothorax is identified. No acute osseous abnormality is seen. IMPRESSION: Hypoinflation with mild bibasilar opacity, likely atelectasis though early infection is not excluded. Electronically Signed   By: Logan Bores M.D.   On: 08/25/2015 07:29   Dg Ugi W/high Density W/kub  07/27/2015  CLINICAL DATA:  Difficulty swallowing EXAM: UPPER GI SERIES WITH KUB TECHNIQUE: After obtaining a scout radiograph a routine upper GI series was performed using thin and high  density barium. FLUOROSCOPY TIME:  Radiation Exposure Index (as provided by the fluoroscopic device): 125 deciGy per square cm If the device does not provide the exposure index: Fluoroscopy Time (in minutes and seconds):  2 minutes 6 seconds Number of Acquired Images: COMPARISON:  Ultrasound of the abdomen of 06/10/2015 FINDINGS: A preliminary film of the abdomen shows a nonspecific bowel gas pattern. No opaque calculi noted. Surgical mesh is present in the right lower quadrant. There are degenerative changes at L4-5. A double-contrast upper GI was performed. The mucosa of the esophagus is unremarkable. A single contrast study shows the swallowing mechanism to be normal. Esophageal peristalsis is normal. There is a small hiatal hernia present. Moderate gastroesophageal reflux is demonstrated. A barium pill was given at the end of the study which passed into the stomach without delay. The stomach is otherwise normal in contour and peristalsis.  The duodenal bulb fills and the duodenal loop is in normal position. IMPRESSION: 1. Small hiatal hernia with moderate gastroesophageal reflux. Barium pill passes into the stomach without delay. 2. The remainder of the stomach and duodenum are unremarkable. Electronically Signed   By: Ivar Drape M.D.   On: 07/27/2015 10:00    Patient seen by me. Along with physician assistant Ree. The patient long-standing dialysis patient normally dialyzes Monday Wednesdays and Fridays. Was dialyzed on Monday. Patient's had constant low substernal and epigastric abdominal pain since Sunday. Patient was evaluated for very similar complaints back in March 2017. Today's workup shows no acute findings. In particular troponin was negative. EKG without any acute changes. Based on the duration of the chest pain would expect troponin to be elevated if this was an unstable angina or MI picture. In addition chest x-ray is negative for any significant pulmonary edema. Also mediastinum is normal. Talking with family member patient's had sort of a chronic complaint of this for at least 2 months occasionally has nausea and vomiting.  The patient is cleared medically for dialysis today. Follow-up with primary care doctor as outpatient. No indication for admission.  Patient does not require emergent dialysis but will arrange dialysis for this afternoon at his dialysis center.  On exam abdomen is distended but nontender. Patient's liver function tests today are normal in month ago lipase was normal. Heart is regular. Lungs are clear bilaterally.  Fredia Sorrow, MD 08/25/15 [phone]

## 2015-08-25 NOTE — Discharge Instructions (Signed)
You were seen in the emergency room today for evaluation of chest pain. Your workup was unrevealing. Please call your primary care provider to schedule a follow up appointment as soon as possible. Please also call your cardiologist's office to schedule a follow up appointment. Return to the ER for new or worsening symptoms.   [facility] will call you to re-schedule your dialysis for either today or tomorrow morning.

## 2015-08-27 ENCOUNTER — Encounter: Payer: Self-pay | Admitting: Gastroenterology

## 2015-10-08 ENCOUNTER — Emergency Department (HOSPITAL_COMMUNITY): Payer: BC Managed Care – PPO

## 2015-10-08 ENCOUNTER — Encounter (HOSPITAL_COMMUNITY): Payer: Self-pay | Admitting: Emergency Medicine

## 2015-10-08 ENCOUNTER — Observation Stay (HOSPITAL_COMMUNITY)
Admission: EM | Admit: 2015-10-08 | Discharge: 2015-10-09 | Disposition: A | Discharge status: Home / Self Care | Payer: BC Managed Care – PPO | Attending: Internal Medicine | Admitting: Internal Medicine

## 2015-10-08 DIAGNOSIS — N186 End stage renal disease: Secondary | ICD-10-CM | POA: Insufficient documentation

## 2015-10-08 DIAGNOSIS — Z7982 Long term (current) use of aspirin: Secondary | ICD-10-CM | POA: Diagnosis not present

## 2015-10-08 DIAGNOSIS — Z79899 Other long term (current) drug therapy: Secondary | ICD-10-CM | POA: Diagnosis not present

## 2015-10-08 DIAGNOSIS — E1159 Type 2 diabetes mellitus with other circulatory complications: Secondary | ICD-10-CM

## 2015-10-08 DIAGNOSIS — R0789 Other chest pain: Principal | ICD-10-CM | POA: Insufficient documentation

## 2015-10-08 DIAGNOSIS — Z794 Long term (current) use of insulin: Secondary | ICD-10-CM | POA: Insufficient documentation

## 2015-10-08 DIAGNOSIS — I2510 Atherosclerotic heart disease of native coronary artery without angina pectoris: Secondary | ICD-10-CM | POA: Insufficient documentation

## 2015-10-08 DIAGNOSIS — E1122 Type 2 diabetes mellitus with diabetic chronic kidney disease: Secondary | ICD-10-CM | POA: Insufficient documentation

## 2015-10-08 DIAGNOSIS — Z87891 Personal history of nicotine dependence: Secondary | ICD-10-CM | POA: Diagnosis not present

## 2015-10-08 DIAGNOSIS — R072 Precordial pain: Secondary | ICD-10-CM

## 2015-10-08 DIAGNOSIS — I739 Peripheral vascular disease, unspecified: Secondary | ICD-10-CM | POA: Diagnosis present

## 2015-10-08 DIAGNOSIS — E119 Type 2 diabetes mellitus without complications: Secondary | ICD-10-CM

## 2015-10-08 DIAGNOSIS — I779 Disorder of arteries and arterioles, unspecified: Secondary | ICD-10-CM | POA: Diagnosis present

## 2015-10-08 DIAGNOSIS — Z992 Dependence on renal dialysis: Secondary | ICD-10-CM | POA: Diagnosis not present

## 2015-10-08 DIAGNOSIS — I120 Hypertensive chronic kidney disease with stage 5 chronic kidney disease or end stage renal disease: Secondary | ICD-10-CM | POA: Insufficient documentation

## 2015-10-08 DIAGNOSIS — R079 Chest pain, unspecified: Secondary | ICD-10-CM | POA: Diagnosis present

## 2015-10-08 HISTORY — DX: Dependence on renal dialysis: Z99.2

## 2015-10-08 HISTORY — DX: Anemia in other chronic diseases classified elsewhere: D63.8

## 2015-10-08 HISTORY — DX: End stage renal disease: N18.6

## 2015-10-08 LAB — CBC WITH DIFFERENTIAL/PLATELET
Basophils Absolute: 0 10*3/uL (ref 0.0–0.1)
Basophils Relative: 0 %
Eosinophils Absolute: 0.3 10*3/uL (ref 0.0–0.7)
Eosinophils Relative: 6 %
HCT: 35.4 % — ABNORMAL LOW (ref 39.0–52.0)
Hemoglobin: 11.6 g/dL — ABNORMAL LOW (ref 13.0–17.0)
Lymphocytes Relative: 28 %
Lymphs Abs: 1.5 10*3/uL (ref 0.7–4.0)
MCH: 31 pg (ref 26.0–34.0)
MCHC: 32.8 g/dL (ref 30.0–36.0)
MCV: 94.7 fL (ref 78.0–100.0)
Monocytes Absolute: 0.6 10*3/uL (ref 0.1–1.0)
Monocytes Relative: 11 %
Neutro Abs: 3.1 10*3/uL (ref 1.7–7.7)
Neutrophils Relative %: 55 %
Platelets: 215 10*3/uL (ref 150–400)
RBC: 3.74 MIL/uL — ABNORMAL LOW (ref 4.22–5.81)
RDW: 13.6 % (ref 11.5–15.5)
WBC: 5.6 10*3/uL (ref 4.0–10.5)

## 2015-10-08 LAB — COMPREHENSIVE METABOLIC PANEL
ALT: 20 U/L (ref 17–63)
AST: 17 U/L (ref 15–41)
Albumin: 3.6 g/dL (ref 3.5–5.0)
Alkaline Phosphatase: 74 U/L (ref 38–126)
Anion gap: 10 (ref 5–15)
BUN: 28 mg/dL — ABNORMAL HIGH (ref 6–20)
CO2: 31 mmol/L (ref 22–32)
Calcium: 8.3 mg/dL — ABNORMAL LOW (ref 8.9–10.3)
Chloride: 95 mmol/L — ABNORMAL LOW (ref 101–111)
Creatinine, Ser: 8.49 mg/dL — ABNORMAL HIGH (ref 0.61–1.24)
GFR calc Af Amer: 7 mL/min — ABNORMAL LOW (ref >60)
GFR calc non Af Amer: 6 mL/min — ABNORMAL LOW (ref >60)
Glucose, Bld: 126 mg/dL — ABNORMAL HIGH (ref 65–99)
Potassium: 3.9 mmol/L (ref 3.5–5.1)
Sodium: 136 mmol/L (ref 135–145)
Total Bilirubin: 0.4 mg/dL (ref 0.3–1.2)
Total Protein: 7.6 g/dL (ref 6.5–8.1)

## 2015-10-08 LAB — GLUCOSE, CAPILLARY
Glucose-Capillary: 122 mg/dL — ABNORMAL HIGH (ref 65–99)
Glucose-Capillary: 156 mg/dL — ABNORMAL HIGH (ref 65–99)
Glucose-Capillary: 205 mg/dL — ABNORMAL HIGH (ref 65–99)
Glucose-Capillary: 128 mg/dL — ABNORMAL HIGH (ref 65–99)

## 2015-10-08 LAB — TROPONIN I
Troponin I: 0.03 ng/mL (ref <0.03)
Troponin I: 0.03 ng/mL (ref <0.03)
Troponin I: 0.03 ng/mL (ref <0.03)

## 2015-10-08 LAB — I-STAT TROPONIN, ED: Troponin i, poc: 0.02 ng/mL (ref 0.00–0.08)

## 2015-10-08 MED ORDER — NITROGLYCERIN 0.4 MG SL SUBL: 0.4000 mg | SUBLINGUAL_TABLET | SUBLINGUAL | Status: DC | PRN

## 2015-10-08 MED ORDER — CINACALCET HCL 30 MG PO TABS
60.0000 mg | ORAL_TABLET | Freq: Every day | ORAL | Status: DC
  Administered 2015-10-08 – 2015-10-09 (×2): 60 mg via ORAL
  Filled 2015-10-08 (×2): qty 2

## 2015-10-08 MED ORDER — GABAPENTIN 100 MG PO CAPS
200.0000 mg | ORAL_CAPSULE | Freq: Every day | ORAL | Status: DC
  Administered 2015-10-08: 200 mg via ORAL
  Filled 2015-10-08: qty 2

## 2015-10-08 MED ORDER — INSULIN GLARGINE 100 UNIT/ML ~~LOC~~ SOLN: 12.0000 [IU] | Freq: Every day | SUBCUTANEOUS | Status: DC

## 2015-10-08 MED ORDER — PANTOPRAZOLE SODIUM 40 MG PO TBEC
40.0000 mg | DELAYED_RELEASE_TABLET | Freq: Every day | ORAL | Status: DC
  Administered 2015-10-09: 40 mg via ORAL
  Filled 2015-10-08: qty 1

## 2015-10-08 MED ORDER — ACETAMINOPHEN 325 MG PO TABS: 650.0000 mg | ORAL_TABLET | ORAL | Status: DC | PRN

## 2015-10-08 MED ORDER — GABAPENTIN 100 MG PO CAPS
100.0000 mg | ORAL_CAPSULE | Freq: Two times a day (BID) | ORAL | Status: DC
  Administered 2015-10-08 – 2015-10-09 (×2): 100 mg via ORAL
  Filled 2015-10-08 (×2): qty 1

## 2015-10-08 MED ORDER — INSULIN PUMP
Freq: Three times a day (TID) | SUBCUTANEOUS | Status: DC
  Administered 2015-10-08 – 2015-10-09 (×4): via SUBCUTANEOUS
  Filled 2015-10-08: qty 1

## 2015-10-08 MED ORDER — ONDANSETRON HCL 4 MG/2ML IJ SOLN: 4.0000 mg | Freq: Four times a day (QID) | INTRAMUSCULAR | Status: DC | PRN

## 2015-10-08 MED ORDER — ASPIRIN EC 81 MG PO TBEC
81.0000 mg | DELAYED_RELEASE_TABLET | Freq: Every day | ORAL | Status: DC
  Administered 2015-10-09: 81 mg via ORAL
  Filled 2015-10-08: qty 1

## 2015-10-08 MED ORDER — RENA-VITE PO TABS
1.0000 | ORAL_TABLET | Freq: Every day | ORAL | Status: DC
Start: 2015-10-08 — End: 2015-10-09
  Administered 2015-10-08: 1 via ORAL
  Filled 2015-10-08: qty 1

## 2015-10-08 MED ORDER — NITROGLYCERIN 0.4 MG SL SUBL
0.4000 mg | SUBLINGUAL_TABLET | SUBLINGUAL | Status: DC | PRN
  Administered 2015-10-08: 0.4 mg via SUBLINGUAL
  Filled 2015-10-08: qty 1

## 2015-10-08 MED ORDER — CALCIUM ACETATE (PHOS BINDER) 667 MG PO CAPS
2001.0000 mg | ORAL_CAPSULE | Freq: Three times a day (TID) | ORAL | Status: DC
  Administered 2015-10-08 – 2015-10-09 (×4): 2001 mg via ORAL
  Filled 2015-10-08 (×4): qty 3

## 2015-10-08 MED ORDER — ROPINIROLE HCL 0.5 MG PO TABS
0.5000 mg | ORAL_TABLET | Freq: Three times a day (TID) | ORAL | Status: DC
  Administered 2015-10-08 – 2015-10-09 (×2): 0.5 mg via ORAL
  Filled 2015-10-08 (×3): qty 1

## 2015-10-08 MED ORDER — CLOPIDOGREL BISULFATE 75 MG PO TABS
75.0000 mg | ORAL_TABLET | Freq: Every day | ORAL | Status: DC
  Administered 2015-10-09: 75 mg via ORAL
  Filled 2015-10-08: qty 1

## 2015-10-08 MED ORDER — ATORVASTATIN CALCIUM 40 MG PO TABS
40.0000 mg | ORAL_TABLET | Freq: Every day | ORAL | Status: DC
  Administered 2015-10-08: 40 mg via ORAL

## 2015-10-08 MED ORDER — CLONAZEPAM 0.5 MG PO TABS
0.5000 mg | ORAL_TABLET | Freq: Every day | ORAL | Status: DC
Start: 2015-10-08 — End: 2015-10-09
  Administered 2015-10-08: 0.5 mg via ORAL
  Filled 2015-10-08: qty 1

## 2015-10-08 MED ORDER — HEPARIN SODIUM (PORCINE) 5000 UNIT/ML IJ SOLN
5000.0000 [IU] | Freq: Three times a day (TID) | INTRAMUSCULAR | Status: DC
  Administered 2015-10-08 – 2015-10-09 (×3): 5000 [IU] via SUBCUTANEOUS
  Filled 2015-10-08 (×3): qty 1

## 2015-10-08 MED ORDER — INSULIN ASPART 100 UNIT/ML ~~LOC~~ SOLN: 0.0000 [IU] | Freq: Three times a day (TID) | SUBCUTANEOUS | Status: DC

## 2015-10-08 NOTE — ED Notes (Signed)
MD made aware of patients complaint of chest pain

## 2015-10-08 NOTE — H&P (Signed)
TRH H&P   Patient Demographics:    Jon Anderson, is a 65 y.o. male  MRN: [id number]   DOB - Nov 29, 1950  Admit Date - 10/08/2015  Outpatient Primary MD for the patient is [name], MD  Referring MD/NP/PA: Dr Marigene Ehlers  Outpatient Specialit: Renal Dr. Urbano Heir  Patient coming from: HD center  Chief Complaint  Patient presents with  . Chest Pain      HPI:    Jon Anderson  is a 65 y.o. male, With history of hypertension, hyperlipidemia, diabetes mellitus, end-stage renal disease, peripheral vascular disease, anemia, coronary artery disease with history of normal coronary arteries in cardiac cath 06/2014 , presents with complaints of chest pain, reports chest pain is intermittent, over last week, was relieved by sublingual nitroglycerin received in NAD, accompanied by dyspnea, nausea, currently chest pain-free after receiving sublingual nitroglycerin, had chest pain during hemodialysis session today, received only 1 hour, was sent to ED from dialysis center. - In ED troponins were 0.02, EKG nonacute, chest pain-free after receiving sublingual nitroglycerin, received 4 baby aspirin by EMS, seen by cardiology.    Review of systems:    In addition to the HPI above,  No Fever-chills, No Headache, No changes with Vision or hearing, No problems swallowing food or Liquids, Place of chest pain, and dyspnea No Abdominal pain, No r Vommitting, Bowel movements are regular, reports mild nausea No Blood in stool or Urine, No dysuria, No new skin rashes or bruises, No new joints pains-aches,  No new weakness, tingling, numbness in any extremity, No recent weight gain or loss, No polyuria, polydypsia or polyphagia, No significant Mental Stressors.  A full 10 point Review of Systems was done, except as stated above, all other Review of Systems were negative.   With Past History  of the following :    Past Medical History  Diagnosis Date  . Hyperlipidemia   . HTN (hypertension)     echo 3/10: EF 60%, LAE  . CAD (coronary artery disease)     a. cath 3/10: oLAD 25%, mLAD 25%; CFX 25%; oDx 80% (small - tx. medically); pRCA 25%, mRCA 25%; OM3 20%;  b.  Myoview 4/11: EF 53%, no scar or ischemia   c. MV 2012 Nl perfusion, apical thinning.  No ischemia or scar.  EF 49%, appears greater by visual estimate.;  d.  Dob stress echo 12/13:  Negative Dob stress echo. There is no evidence of ischemia.  The LVF is normal.  . Chronic chest pain   . GERD (gastroesophageal reflux disease)   . Carotid stenosis     a. dopplers 05/2012:  [id number] RICA; [id number] LICA => repeat in 6 mos  . Snores     a. presumed OSA, pt has refused sleep eval in past.  . Peripheral arterial disease ([address])     high-grade distal left SFA by duplex ultrasound  . Irregular heart beats   .  Anginal pain ([place])   . Type II diabetes mellitus ([place])   . Pneumonia   . Nephrolithiasis     "passed them all"  . CKD (chronic kidney disease), stage IV ([place])     a. 05/2012 Renal u/s: medical renal dzs;  b. seen by [facility]      Past Surgical History  Procedure Laterality Date  . Angioplasty / stenting femoral Left 12/11/2013    dr berry  . Foot fracture surgery Right   . Colonoscopy w/ biopsies and polypectomy    . Lower extremity angiogram Left 12/11/2013    Procedure: LOWER EXTREMITY ANGIOGRAM;  Surgeon: Lorretta Harp, MD;  Location: [facility] CATH LAB;  Service: Cardiovascular;  Laterality: Left;  . Av fistula placement Left 03/19/2014    Procedure: CREATION OF ARTERIOVENOUS (AV) FISTULA  LEFT UPPER ARM;  Surgeon: Mal Misty, MD;  Location: [place];  Service: Vascular;  Laterality: Left;  . Tonsillectomy and adenoidectomy    . Fracture surgery    . Inguinal hernia repair Left   . Cardiac catheterization  2001 and 2010   . Left heart catheterization with coronary angiogram N/A 06/22/2014    Procedure: LEFT  HEART CATHETERIZATION WITH CORONARY ANGIOGRAM;  Surgeon: Troy Sine, MD;  Location: [facility] CATH LAB;  Service: Cardiovascular;  Laterality: N/A;      Social History:     Social History  Substance Use Topics  . Smoking status: Former Smoker -- 1.00 packs/day for 2 years    Types: Cigarettes  . Smokeless tobacco: Never Used     Comment: 06/16/2014 "quit smoking cigarettes in the early 1970's"  . Alcohol Use: No     Lives - At home  Mobility - independent    Family History :     Family History  Problem Relation Age of Onset  . Heart attack Sister     died @ 70  . Cancer Mother     died @ 30  . Diabetes Brother     deceased   *   Home Medications:   Prior to Admission medications   Medication Sig Start Date End Date Taking? Authorizing Provider  aspirin EC 81 MG tablet Take 1 tablet (81 mg total) by mouth daily. 05/28/12  Yes Liliane Shi, PA-C  atorvastatin (LIPITOR) 40 MG tablet Take 1 tablet (40 mg total) by mouth daily at 6 PM. 12/13/13  Yes Brett Canales, PA-C  calcium acetate (PHOSLO) 667 MG capsule Take 2 capsules (1,334 mg total) by mouth 3 (three) times daily with meals. Patient taking differently: Take 2,001 mg by mouth 3 (three) times daily with meals.  06/23/14  Yes Domenic Polite, MD  cinacalcet (SENSIPAR) 60 MG tablet Take 60 mg by mouth daily.   Yes Historical Provider, MD  clonazePAM (KLONOPIN) 0.5 MG tablet TAKE 1 TABLET BY MOUTH AT BEDTIME 09/22/14  Yes Historical Provider, MD  clopidogrel (PLAVIX) 75 MG tablet Take 1 tablet (75 mg total) by mouth daily with breakfast. 12/13/13  Yes Brett Canales, PA-C  gabapentin (NEURONTIN) 100 MG capsule Take 100-200 mg by mouth See admin instructions. Pt takes 100mg  morning and afternoon, and 200mg  at night 10/02/14  Yes Historical Provider, MD  multivitamin (RENA-VIT) TABS tablet Take 1 tablet by mouth at bedtime. 06/23/14  Yes Domenic Polite, MD  nitroGLYCERIN (NITROSTAT) 0.4 MG SL tablet Place 1 tablet (0.4 mg total) under  the tongue every 5 (five) minutes as needed for chest pain. 03/20/12  Yes Evelene Croon  Barrett, PA-C  NOVOLOG 100 UNIT/ML injection 0.75 units per hour via insulin pump per pt - - USE AS DIRECTED (TRITRATE AS NEEDED) *MAX 50 UNITS A DAY* 08/28/14  Yes Historical Provider, MD  pantoprazole (PROTONIX) 40 MG tablet Take 40 mg by mouth daily.   Yes Historical Provider, MD  rOPINIRole (REQUIP) 0.5 MG tablet Take 0.5 mg by mouth 3 (three) times daily.    Yes Historical Provider, MD  azithromycin (ZITHROMAX) 250 MG tablet Take 1 tablet (250 mg total) by mouth daily. Take first 2 tablets together, then 1 every day until finished. Patient not taking: Reported on 10/08/2015 06/10/15   Jeannett Senior, PA-C  HYDROcodone-acetaminophen (NORCO) 5-325 MG per tablet Take 1 tablet by mouth every 6 (six) hours as needed for moderate pain. Patient not taking: Reported on 10/08/2015 03/19/14   Aldona Bar J Rhyne, PA-C  polyethylene glycol [facility]) packet Take 17 g by mouth daily. Patient not taking: Reported on 10/08/2015 06/10/15   Tatyana Kirichenko, PA-C  sodium phosphate (FLEET) 7-19 GM/118ML ENEM Place 133 mLs (1 enema total) rectally once. Patient not taking: Reported on 10/08/2015 06/10/15   Tatyana Kirichenko, PA-C  sucralfate (CARAFATE) 1 g tablet Take 1 tablet (1 g total) by mouth 4 (four) times daily -  with meals and at bedtime. Patient not taking: Reported on 10/08/2015 06/10/15   Jeannett Senior, PA-C     Allergies:     Allergies  Allergen Reactions  . Kiwi Extract Swelling    Facial swelling      Physical Exam:   Vitals  Blood pressure 143/89, pulse 75, temperature 97.9 F (36.6 C), temperature source Oral, resp. rate 21, SpO2 95 %.   1. General Well-developed male lying in bed in NAD,    2. Normal affect and insight, Not Suicidal or Homicidal, Awake Alert, Oriented X 3.  3. No F.N deficits, ALL C.Nerves Intact, Strength 5/5 all 4 extremities, Sensation intact all 4 extremities, Plantars down  going.  4. Ears and Eyes appear Normal, Conjunctivae clear, PERRLA. Moist Oral Mucosa.  5. Supple Neck, No JVD, No cervical lymphadenopathy appriciated, No Carotid Bruits.  6. Symmetrical Chest wall movement, Good air movement bilaterally, CTAB.  7. RRR, No Gallops, Rubs or Murmurs, No Parasternal Heave.  8. Positive Bowel Sounds, Abdomen Soft, No tenderness, No organomegaly appriciated,No rebound -guarding or rigidity.  9.  No Cyanosis, Normal Skin Turgor, No Skin Rash or Bruise.  10. Good muscle tone,  joints appear normal , no effusions, Normal ROM.  11. No Palpable Lymph Nodes in Neck or Axillae    Data Review:    CBC  Recent Labs Lab 10/08/15 0915  WBC 5.6  HGB 11.6*  HCT 35.4*  PLT 215  MCV 94.7  MCH 31.0  MCHC 32.8  RDW 13.6  LYMPHSABS 1.5  MONOABS 0.6  EOSABS 0.3  BASOSABS 0.0   ------------------------------------------------------------------------------------------------------------------  Chemistries   Recent Labs Lab 10/08/15 0915  NA 136  K 3.9  CL 95*  CO2 31  GLUCOSE 126*  BUN 28*  CREATININE 8.49*  CALCIUM 8.3*  AST 17  ALT 20  ALKPHOS 74  BILITOT 0.4   ------------------------------------------------------------------------------------------------------------------ CrCl cannot be calculated (Unknown ideal weight.). ------------------------------------------------------------------------------------------------------------------ No results for input(s): TSH, T4TOTAL, T3FREE, THYROIDAB in the last 72 hours.  Invalid input(s): FREET3  Coagulation profile No results for input(s): INR, PROTIME in the last 168 hours. ------------------------------------------------------------------------------------------------------------------- No results for input(s): DDIMER in the last 72 hours. -------------------------------------------------------------------------------------------------------------------  Cardiac Enzymes No results for  input(s): CKMB,  TROPONINI, MYOGLOBIN in the last 168 hours.  Invalid input(s): CK ------------------------------------------------------------------------------------------------------------------    Component Value Date/Time   BNP 432.2* 06/16/2014 0033     ---------------------------------------------------------------------------------------------------------------  Urinalysis    Component Value Date/Time   COLORURINE YELLOW 06/18/2014 2308   APPEARANCEUR CLEAR 06/18/2014 2308   LABSPEC 1.012 06/18/2014 2308   PHURINE 7.0 06/18/2014 2308   GLUCOSEU 250* 06/18/2014 2308   HGBUR MODERATE* 06/18/2014 2308   BILIRUBINUR NEGATIVE 06/18/2014 2308   KETONESUR NEGATIVE 06/18/2014 2308   PROTEINUR >300* 06/18/2014 2308   UROBILINOGEN 0.2 06/18/2014 2308   NITRITE NEGATIVE 06/18/2014 2308   LEUKOCYTESUR NEGATIVE 06/18/2014 2308    ----------------------------------------------------------------------------------------------------------------   Imaging Results:    No results found.  My personal review of EKG: Rhythm NSR, Rate  78 /min, QTc 483 , no Acute ST changes   Assessment & Plan:    Active Problems:   DM (diabetes mellitus) ([place])   Carotid artery disease ([place])   PVD (peripheral vascular disease) ([facility])   Chest pain  Chest pain - Patient  has typical and nontypical features , will be admitted to telemetry for further workup, currently chest pain-free, patient with negative cardiac cath in 06/2014, oriented received aspirin, cardiology consulted.  Diabetes mellitus - We'll continue with patient insulin pump  ESRD - Patient did not finish his hemodialysis today, will consult renal  History of PVD - s/p PCI to L SFA - Continue with aspirin, Plavix and statin  Anemia of chronic kidney disease - Stable, management per renal  DVT Prophylaxis Heparin -SCDs  AM Labs Ordered, also please review Full Orders  Family Communication: Admission, patients condition and  plan of care including tests being ordered have been discussed with the patient and wife who indicate understanding and agree with the plan and Code Status.  Code Status full  Likely DC to Home  Condition GUARDED   Consults called: card, renal  Admission status: Obs  Time spent in minutes : 55 minutes   Mida Cory M.D on 10/08/2015 at 11:19 AM  Between 7am to 7pm - Pager - [phone]. After 7pm go to [email or web address] - password [facility]  [facility] - Office  [phone]

## 2015-10-08 NOTE — ED Notes (Signed)
EKG was delayed due to equipment not functioning properly.

## 2015-10-08 NOTE — ED Notes (Signed)
Patient comes from Dialysis one hour into a 4 hour treatment. Patient states he started having chest pressure in the center of the chest non-radiating. Patient states he felt nausea when he woke up and went away before getting to dialysis. Patient denies any other complaints at this time. Patient rates pain 3/10. Patient given 324 ASA with EMS and had difficulty swallowing states baseline. Patient alert and oriented on arrival.

## 2015-10-08 NOTE — ED Provider Notes (Signed)
CSN: [id number]     Arrival date & time 10/08/15  0848 History   First MD Initiated Contact with Patient 10/08/15 0848     Chief Complaint  Patient presents with  . Chest Pain    HPI Comments: 65 year old male with history of ESRD on hemodialysis, type 2 diabetes, non-obstructive coronary artery disease, PAD s/p stenting, hypertension, hyperlipidemia presents to the ED with 1 hour of chest heaviness that occurred at rest while at dialysis. He improved slightly with aspirin via EMS. Earlier today he experienced nausea, however this has resolved. No recent illness, he has otherwise been feeling healthy and well until this chest heaviness occurred.  Patient is a 64 y.o. male presenting with chest pain. The history is provided by the patient.  Chest Pain Pain location:  Substernal area Pain quality comment:  "heaviness" Pain radiates to:  Does not radiate Pain radiates to the back: no   Pain severity:  Severe Onset quality:  Sudden Duration:  1 hour Timing:  Constant Progression:  Improving Chronicity:  New Context: at rest   Context comment:  While sitting at dialysis Relieved by:  None tried Worsened by:  Nothing tried Ineffective treatments:  None tried Associated symptoms: nausea   Associated symptoms: no abdominal pain, no cough, no fever and no lower extremity edema   Nausea:    Severity:  Moderate   Onset quality:  Gradual   Nausea duration: Occurred this morning before dilaysis.   Progression:  Resolved Risk factors: coronary artery disease, diabetes mellitus, high cholesterol, hypertension and male sex     Past Medical History  Diagnosis Date  . Hyperlipidemia   . HTN (hypertension)     echo 3/10: EF 60%, LAE  . CAD (coronary artery disease)     a. cath 3/10: oLAD 25%, mLAD 25%; CFX 25%; oDx 80% (small - tx. medically); pRCA 25%, mRCA 25%; OM3 20%;  b.  Myoview 4/11: EF 53%, no scar or ischemia   c. MV 2012 Nl perfusion, apical thinning.  No ischemia or scar.  EF 49%,  appears greater by visual estimate.;  d.  Dob stress echo 12/13:  Negative Dob stress echo. There is no evidence of ischemia.  The LVF is normal.  . Chronic chest pain   . GERD (gastroesophageal reflux disease)   . Carotid stenosis     a. dopplers 05/2012:  [id number] RICA; [id number] LICA => repeat in 6 mos  . Snores     a. presumed OSA, pt has refused sleep eval in past.  . Peripheral arterial disease ([place])     high-grade distal left SFA by duplex ultrasound  . Irregular heart beats   . Anginal pain ([place])   . Type II diabetes mellitus ([place])   . Pneumonia   . Nephrolithiasis     "passed them all"  . CKD (chronic kidney disease), stage IV ([place])     a. 05/2012 Renal u/s: medical renal dzs;  b. seen by [facility]   Past Surgical History  Procedure Laterality Date  . Angioplasty / stenting femoral Left 12/11/2013    dr berry  . Foot fracture surgery Right   . Colonoscopy w/ biopsies and polypectomy    . Lower extremity angiogram Left 12/11/2013    Procedure: LOWER EXTREMITY ANGIOGRAM;  Surgeon: Lorretta Harp, MD;  Location: [facility] CATH LAB;  Service: Cardiovascular;  Laterality: Left;  . Av fistula placement Left 03/19/2014    Procedure: CREATION OF ARTERIOVENOUS (AV) FISTULA  LEFT UPPER  ARM;  Surgeon: Mal Misty, MD;  Location: [place];  Service: Vascular;  Laterality: Left;  . Tonsillectomy and adenoidectomy    . Fracture surgery    . Inguinal hernia repair Left   . Cardiac catheterization  2001 and 2010   . Left heart catheterization with coronary angiogram N/A 06/22/2014    Procedure: LEFT HEART CATHETERIZATION WITH CORONARY ANGIOGRAM;  Surgeon: Troy Sine, MD;  Location: [facility] CATH LAB;  Service: Cardiovascular;  Laterality: N/A;   Family History  Problem Relation Age of Onset  . Heart attack Sister     died @ 38  . Cancer Mother     died @ 56  . Diabetes Brother     deceased   Social History  Substance Use Topics  . Smoking status: Former Smoker -- 1.00 packs/day for 2 years     Types: Cigarettes  . Smokeless tobacco: Never Used     Comment: 06/16/2014 "quit smoking cigarettes in the early 1970's"  . Alcohol Use: No    Review of Systems  Constitutional: Negative for fever.  HENT: Negative for congestion.   Eyes: Negative for redness.  Respiratory: Negative for cough.   Cardiovascular: Positive for chest pain.  Gastrointestinal: Positive for nausea. Negative for abdominal pain.  Genitourinary: Negative for flank pain.  Musculoskeletal: Negative for neck stiffness.  Skin: Negative for rash.  Neurological: Negative for speech difficulty.  Psychiatric/Behavioral: Negative for confusion.      Allergies  Kiwi extract  Home Medications   Prior to Admission medications   Medication Sig Start Date End Date Taking? Authorizing Provider  aspirin EC 81 MG tablet Take 1 tablet (81 mg total) by mouth daily. 05/28/12   Liliane Shi, PA-C  atorvastatin (LIPITOR) 40 MG tablet Take 1 tablet (40 mg total) by mouth daily at 6 PM. 12/13/13   Brett Canales, PA-C  azithromycin (ZITHROMAX) 250 MG tablet Take 1 tablet (250 mg total) by mouth daily. Take first 2 tablets together, then 1 every day until finished. 06/10/15   Tatyana Kirichenko, PA-C  calcium acetate (PHOSLO) 667 MG capsule Take 2 capsules (1,334 mg total) by mouth 3 (three) times daily with meals. 06/23/14   Domenic Polite, MD  cinacalcet (SENSIPAR) 60 MG tablet Take 60 mg by mouth daily.    Historical Provider, MD  clonazePAM (KLONOPIN) 0.5 MG tablet TAKE 1 TABLET BY MOUTH AT BEDTIME AS NEEDED FOR CRAMPS 09/22/14   Historical Provider, MD  clopidogrel (PLAVIX) 75 MG tablet Take 1 tablet (75 mg total) by mouth daily with breakfast. 12/13/13   Brett Canales, PA-C  gabapentin (NEURONTIN) 100 MG capsule Take 100 mg by mouth 2 (two) times daily.  10/02/14   Historical Provider, MD  HYDROcodone-acetaminophen (NORCO) 5-325 MG per tablet Take 1 tablet by mouth every 6 (six) hours as needed for moderate pain. 03/19/14   Samantha  J Rhyne, PA-C  multivitamin (RENA-VIT) TABS tablet Take 1 tablet by mouth at bedtime. 06/23/14   Domenic Polite, MD  nitroGLYCERIN (NITROSTAT) 0.4 MG SL tablet Place 1 tablet (0.4 mg total) under the tongue every 5 (five) minutes as needed for chest pain. 03/20/12   Rhonda G Barrett, PA-C  NOVOLOG 100 UNIT/ML injection USE AS DIRECTED (TRITRATE AS NEEDED) *MAX 50 UNITS A DAY* 08/28/14   Historical Provider, MD  pantoprazole (PROTONIX) 40 MG tablet Take 40 mg by mouth daily.    Historical Provider, MD  polyethylene glycol (MIRALAX) packet Take 17 g by mouth daily. 06/10/15  Tatyana Kirichenko, PA-C  rOPINIRole (REQUIP) 0.5 MG tablet Take 0.5 mg by mouth at bedtime.    Historical Provider, MD  sodium phosphate (FLEET) 7-19 GM/118ML ENEM Place 133 mLs (1 enema total) rectally once. 06/10/15   Tatyana Kirichenko, PA-C  sucralfate (CARAFATE) 1 g tablet Take 1 tablet (1 g total) by mouth 4 (four) times daily -  with meals and at bedtime. 06/10/15   Tatyana Kirichenko, PA-C   BP 148/69 mmHg  Pulse 80  Temp(Src) 97.9 F (36.6 C) (Oral)  Resp 18  SpO2 94% Physical Exam  Constitutional: He is oriented to person, place, and time. He appears well-developed and well-nourished. No distress.  HENT:  Head: Normocephalic and atraumatic.  Right Ear: External ear normal.  Left Ear: External ear normal.  Nose: Nose normal.  Neck: Normal range of motion.  Cardiovascular: Normal rate and regular rhythm.   Pulses:      Radial pulses are 2+ on the right side, and 2+ on the left side.       Posterior tibial pulses are 2+ on the right side, and 2+ on the left side.  No peripheral edema  Pulmonary/Chest: Effort normal and breath sounds normal. He has no decreased breath sounds.  Abdominal: Soft. Bowel sounds are normal. He exhibits no distension. There is no tenderness.  Musculoskeletal:  Fistula accessed in LUE  Neurological: He is alert and oriented to person, place, and time.  Skin: Skin is warm and dry. He is  not diaphoretic.  Psychiatric: He has a normal mood and affect.  Vitals reviewed.   ED Course  Procedures  Labs Review Labs Reviewed  CBC WITH DIFFERENTIAL/PLATELET - Abnormal; Notable for the following:    RBC 3.74 (*)    Hemoglobin 11.6 (*)    HCT 35.4 (*)    All other components within normal limits  COMPREHENSIVE METABOLIC PANEL - Abnormal; Notable for the following:    Chloride 95 (*)    Glucose, Bld 126 (*)    BUN 28 (*)    Creatinine, Ser 8.49 (*)    Calcium 8.3 (*)    GFR calc non Af Amer 6 (*)    GFR calc Af Amer 7 (*)    All other components within normal limits  TROPONIN I  TROPONIN I  TROPONIN I  CBC  CREATININE, SERUM  I-STAT TROPOININ, ED    Imaging Review Dg Chest 2 View  10/08/2015  CLINICAL DATA:  Chest pain. EXAM: CHEST  2 VIEW COMPARISON:  08/25/2015. FINDINGS: Mediastinum hilar structures are normal. Mild bibasilar atelectasis again noted. Mild right base infiltrate cannot be excluded. No pleural effusion or pneumothorax. Heart size stable. Normal pulmonary vascularity. Stable mild thoracic spine compression fractures. No acute bony abnormality. IMPRESSION: Low lung volumes with mild bibasilar atelectasis again noted. Mild right base infiltrate cannot be excluded. Electronically Signed   By: Marcello Moores  Register   On: 10/08/2015 11:24   I have personally reviewed and evaluated these images and lab results as part of my medical decision-making.   EKG Interpretation   Date/Time:  Friday October 08 2015 09:15:50 EDT Ventricular Rate:  73 PR Interval:  158 QRS Duration: 98 QT Interval:  408 QTC Calculation: 449 R Axis:   49 Text Interpretation:  Sinus rhythm with Premature atrial complexes  Anteroseptal infarct , age undetermined Abnormal ECG No significant change  was found Confirmed by CAMPOS  MD, Lennette Bihari ([id number]) on 10/08/2015 9:19:14 AM      MDM   Final diagnoses:  Chest heaviness    65 y.o. male presents with chest pain/heaviness. It has improved  prior to arrival with aspirin given via EMS. Remainder as above.  He is high risk for coronary artery disease given his history of known coronary artery disease, peripheral artery disease, age, hypertension, hyperlipidemia, diabetes.  EKG shows no acute changes from prior. Initial troponin is detectable but within normal limits. His presentation is not consistent with aortic dissection, PE. Chest x-ray shows no acute process. He has had no fever/chills/cough.  While in the ED he developed chest pain again. Repeat EKG is unchanged from prior. His pain resolved with sublingual nitroglycerin.  I discussed his case with the hospitalist, who admitted him to the stepdown unit given his ongoing chest heaviness. Cardiology was consulted as well. They will evaluate the patient.  Case managed in conjunction with my attending, Dr. Venora Maples.    Berenice Primas, MD 10/08/15 [place], MD 10/08/15 [phone]

## 2015-10-08 NOTE — Progress Notes (Signed)
Inpatient Diabetes Program Recommendations  AACE/ADA: New Consensus Statement on Inpatient Glycemic Control (2015)  Target Ranges:  Prepandial:   less than 140 mg/dL      Peak postprandial:   less than 180 mg/dL (1-2 hours)      Critically ill patients:  140 - 180 mg/dL   Lab Results  Component Value Date   GLUCAP 122* 10/08/2015   HGBA1C 7.1* 12/15/2013    Review of Glycemic Control  Diabetes history: DM 1 Outpatient Diabetes medications: Insulin pump Current orders for Inpatient glycemic control: Insulin pump  Inpatient Diabetes Program Recommendations:  Spoke with patient. Patient has a Medtronic insulin pump with rate of 0.75 units/hr. Patient states he does not cover his carbohydrates, but instead covers 1 hr. Postprandial CBGs. RN to print insulin pump contract and flowsheet and have patient sign. Patient states his current insulin pump site was placed yesterday and does not have supplies @ this time to switch sites. Will follow.  Thank you, Nani Gasser. Teghan Philbin, RN, MSN, CDE Inpatient Glycemic Control Team Team Pager [phone] (8am-5pm) 10/08/2015 1:58 PM

## 2015-10-08 NOTE — Consult Note (Signed)
Cardiology Consultation Note    Patient ID: Jon Anderson, MRN: [id number], DOB/AGE: 1951/02/09 65 y.o. Admit date: 10/08/2015   Date of Consult: 10/08/2015 Primary Physician: [name], MD Primary Cardiologist: Dr. Gwenlyn Found ([place])  Chief Complaint: CP Reason for Consultation: chest pain Requesting MD: Dr. Marigene Ehlers  HPI: Jon Anderson is a 65 y.o. male with history of normal coronary arteries in 06/2014, chronic intermittent chest pain, HTN, HLD, DM, ESRD, PAD s/p PTCA to L SFA, carotid artery disease, anemia of chronic disease whom we are asked to see for chest pain. He underwent LHC in 06/2014 due to chest pain and troponin of 0.04 with cath showing normal coronary arteries and normal EF with LVH. Last echo 06/2014 showed EF 55-60%, grade 2 DD, mild MR.  He presented back to [facility] today with chest pain similar to the prior episodes he's had, including with last cath 06/2014 and ER visit 08/2015. He was at dialysis today in his usual state of health and developed 20 minutes of tightness across his chest a/w SOB; no associated n/v/diaphoresis although he had had nausea earlier in the day. He states the CP occurred when his BP dropped to 99. His wife also reminded him to mention that he felt poorly on Sunday - struggled with restless legs all night, fatigue, and had same chest pressure/dyspnea whenever lying down. He has not had any exertional symptoms with ADLs but does not exercise. In the ER, BP modestly elevated up to 165/78, pulse ox and HR normal. Labs notable for neg troponin, Hgb 11.6 c/w prior.  Past Medical History  Diagnosis Date  . Hyperlipidemia   . HTN (hypertension)     echo 3/10: EF 60%, LAE  . CAD (coronary artery disease)     a.  Myoview 4/11: EF 53%, no scar or ischemia   c. MV 2012 Nl perfusion, apical thinning.  No ischemia or scar.  EF 49%, appears greater by visual estimate.;  d.  Dob stress echo 12/13:  Negative Dob stress echo. There is no evidence of ischemia.  The LVF  is normal. b. Normal cors 2016.  Marland Kitchen Chronic chest pain   . GERD (gastroesophageal reflux disease)   . Carotid stenosis     a. Q000111Q RICA, [id number] LICA by duplex [id number]  . Snores     a. presumed OSA, pt has refused sleep eval in past.  . Peripheral arterial disease ([place])     a. s/p PTCA to L SFA.  . Type II diabetes mellitus ([place])   . Pneumonia   . Nephrolithiasis     "passed them all"  . ESRD (end stage renal disease) on dialysis ([place])   . Anemia of chronic disease       Surgical History:  Past Surgical History  Procedure Laterality Date  . Angioplasty / stenting femoral Left 12/11/2013    dr berry  . Foot fracture surgery Right   . Colonoscopy w/ biopsies and polypectomy    . Lower extremity angiogram Left 12/11/2013    Procedure: LOWER EXTREMITY ANGIOGRAM;  Surgeon: Lorretta Harp, MD;  Location: [facility] CATH LAB;  Service: Cardiovascular;  Laterality: Left;  . Av fistula placement Left 03/19/2014    Procedure: CREATION OF ARTERIOVENOUS (AV) FISTULA  LEFT UPPER ARM;  Surgeon: Mal Misty, MD;  Location: [place];  Service: Vascular;  Laterality: Left;  . Tonsillectomy and adenoidectomy    . Fracture surgery    . Inguinal hernia repair Left   . Cardiac catheterization  2001 and 2010   . Left heart catheterization with coronary angiogram N/A 06/22/2014    Procedure: LEFT HEART CATHETERIZATION WITH CORONARY ANGIOGRAM;  Surgeon: Troy Sine, MD;  Location: [facility] CATH LAB;  Service: Cardiovascular;  Laterality: N/A;     Home Meds: Prior to Admission medications   Medication Sig Start Date End Date Taking? Authorizing Provider  aspirin EC 81 MG tablet Take 1 tablet (81 mg total) by mouth daily. 05/28/12  Yes Liliane Shi, PA-C  atorvastatin (LIPITOR) 40 MG tablet Take 1 tablet (40 mg total) by mouth daily at 6 PM. 12/13/13  Yes Brett Canales, PA-C  calcium acetate (PHOSLO) 667 MG capsule Take 2 capsules (1,334 mg total) by mouth 3 (three) times daily with meals. Patient taking differently:  Take 2,001 mg by mouth 3 (three) times daily with meals.  06/23/14  Yes Domenic Polite, MD  cinacalcet (SENSIPAR) 60 MG tablet Take 60 mg by mouth daily.   Yes Historical Provider, MD  clonazePAM (KLONOPIN) 0.5 MG tablet TAKE 1 TABLET BY MOUTH AT BEDTIME 09/22/14  Yes Historical Provider, MD  clopidogrel (PLAVIX) 75 MG tablet Take 1 tablet (75 mg total) by mouth daily with breakfast. 12/13/13  Yes Brett Canales, PA-C  gabapentin (NEURONTIN) 100 MG capsule Take 100-200 mg by mouth See admin instructions. Pt takes 100mg  morning and afternoon, and 200mg  at night 10/02/14  Yes Historical Provider, MD  multivitamin (RENA-VIT) TABS tablet Take 1 tablet by mouth at bedtime. 06/23/14  Yes Domenic Polite, MD  nitroGLYCERIN (NITROSTAT) 0.4 MG SL tablet Place 1 tablet (0.4 mg total) under the tongue every 5 (five) minutes as needed for chest pain. 03/20/12  Yes Rhonda G Barrett, PA-C  NOVOLOG 100 UNIT/ML injection 0.75 units per hour via insulin pump per pt - - USE AS DIRECTED (TRITRATE AS NEEDED) *MAX 50 UNITS A DAY* 08/28/14  Yes Historical Provider, MD  pantoprazole (PROTONIX) 40 MG tablet Take 40 mg by mouth daily.   Yes Historical Provider, MD  rOPINIRole (REQUIP) 0.5 MG tablet Take 0.5 mg by mouth 3 (three) times daily.    Yes Historical Provider, MD  azithromycin (ZITHROMAX) 250 MG tablet Take 1 tablet (250 mg total) by mouth daily. Take first 2 tablets together, then 1 every day until finished. Patient not taking: Reported on 10/08/2015 06/10/15   Jeannett Senior, PA-C  HYDROcodone-acetaminophen (NORCO) 5-325 MG per tablet Take 1 tablet by mouth every 6 (six) hours as needed for moderate pain. Patient not taking: Reported on 10/08/2015 03/19/14   Aldona Bar J Rhyne, PA-C  polyethylene glycol [facility]) packet Take 17 g by mouth daily. Patient not taking: Reported on 10/08/2015 06/10/15   Tatyana Kirichenko, PA-C  sodium phosphate (FLEET) 7-19 GM/118ML ENEM Place 133 mLs (1 enema total) rectally once. Patient not  taking: Reported on 10/08/2015 06/10/15   Tatyana Kirichenko, PA-C  sucralfate (CARAFATE) 1 g tablet Take 1 tablet (1 g total) by mouth 4 (four) times daily -  with meals and at bedtime. Patient not taking: Reported on 10/08/2015 06/10/15   Jeannett Senior, PA-C    Inpatient Medications:  . [START ON 10/09/2015] aspirin EC  81 mg Oral Daily  . atorvastatin  40 mg Oral q1800  . calcium acetate  2,001 mg Oral TID WC  . cinacalcet  60 mg Oral Daily  . clonazePAM  0.5 mg Oral QHS  . [START ON 10/09/2015] clopidogrel  75 mg Oral Q breakfast  . gabapentin  100-200 mg Oral See admin instructions  .  heparin  5,000 Units Subcutaneous Q8H  . insulin pump   Subcutaneous TID AC, HS, 0200  . multivitamin  1 tablet Oral QHS  . pantoprazole  40 mg Oral Daily  . rOPINIRole  0.5 mg Oral TID      Allergies:  Allergies  Allergen Reactions  . Kiwi Extract Swelling    Facial swelling     Social History   Social History  . Marital Status: Married    Spouse Name: N/A  . Number of Children: N/A  . Years of Education: N/A   Occupational History  . Not on file.   Social History Main Topics  . Smoking status: Former Smoker -- 1.00 packs/day for 2 years    Types: Cigarettes  . Smokeless tobacco: Never Used     Comment: 06/16/2014 "quit smoking cigarettes in the early 1970's"  . Alcohol Use: No  . Drug Use: No  . Sexual Activity: Yes    Birth Control/ Protection: None   Other Topics Concern  . Not on file   Social History Narrative   The patient is a Retail buyer.  He is married and has 2 grown children.  Lives in [place] with his wife.  Denies tobacco, alcohol or IV drug abuse or marijuana or cocaine intake.      Family History  Problem Relation Age of Onset  . Heart attack Sister     died @ 92  . Cancer Mother     died @ 33  . Diabetes Brother     deceased     Review of Systems: occasionally feels cold but no fever. No bleeding reported. All other systems reviewed and are otherwise negative  except as noted above.  Labs: No results for input(s): CKTOTAL, CKMB, TROPONINI in the last 72 hours. Lab Results  Component Value Date   WBC 5.6 10/08/2015   HGB 11.6* 10/08/2015   HCT 35.4* 10/08/2015   MCV 94.7 10/08/2015   PLT 215 10/08/2015     Recent Labs Lab 10/08/15 0915  NA 136  K 3.9  CL 95*  CO2 31  BUN 28*  CREATININE 8.49*  CALCIUM 8.3*  PROT 7.6  BILITOT 0.4  ALKPHOS 74  ALT 20  AST 17  GLUCOSE 126*   Lab Results  Component Value Date   CHOL 200 12/12/2013   HDL 48 12/12/2013   LDLCALC 134* 12/12/2013   TRIG 90 12/12/2013   Lab Results  Component Value Date   DDIMER 0.51* 06/10/2015    Radiology/Studies:  Dg Chest 2 View  10/08/2015  CLINICAL DATA:  Chest pain. EXAM: CHEST  2 VIEW COMPARISON:  08/25/2015. FINDINGS: Mediastinum hilar structures are normal. Mild bibasilar atelectasis again noted. Mild right base infiltrate cannot be excluded. No pleural effusion or pneumothorax. Heart size stable. Normal pulmonary vascularity. Stable mild thoracic spine compression fractures. No acute bony abnormality. IMPRESSION: Low lung volumes with mild bibasilar atelectasis again noted. Mild right base infiltrate cannot be excluded. Electronically Signed   By: Marcello Moores  Register   On: 10/08/2015 11:24    Wt Readings from Last 3 Encounters:  06/10/15 195 lb (88.451 kg)  06/23/14 164 lb 14.5 oz (74.8 kg)  05/05/14 193 lb (87.544 kg)   EKG: NSR prior anteroseptal infarct by tracing, nonspecific ST-T changes, appears similar to prior  Physical Exam: Blood pressure 143/89, pulse 75, temperature 97.9 F (36.6 C), temperature source Oral, resp. rate 21, SpO2 95 %. There is no weight on file to calculate BMI. General: Well developed,  well nourished AAM, in no acute distress. Head: Normocephalic, atraumatic, sclera non-icteric, no xanthomas, nares are without discharge.  Neck: JVD not elevated. Lungs: Clear bilaterally to auscultation without wheezes, rales, or  rhonchi. Breathing is unlabored. Heart: RRR with S1 S2. No murmurs, rubs, or gallops appreciated. Abdomen: Soft, non-tender, non-distended with normoactive bowel sounds. No hepatomegaly. No rebound/guarding. No obvious abdominal masses. Msk:  Strength and tone appear normal for age. Extremities: No clubbing or cyanosis. No edema.  Distal pedal pulses are 2+ and equal bilaterally. Neuro: Alert and oriented X 3. No facial asymmetry. No focal deficit. Moves all extremities spontaneously. Psych:  Responds to questions appropriately with a normal affect.     Assessment and Plan   1. Atypical chest pain - this is the same pain he experienced when prior cath showed normal cors and prior multiple ischemic evals were negative before then, so doubt related to ACS. It seems to have more of a positional component. Today's episode was in the setting of decreased BP. Higher up on the differential includes MSK, neuropathic etiology or atypical pericarditis. Will review further w/u with MD. Note echo in 06/2014 for same sx was without effusion. He is not significantly uremic. We will order a repeat echo. If unremarkable, recommend future workup be geared towards noncardiac causes.  2. Carotid artery disease - duplex in 07/2015 by a Dr. Abbott Pao showed [id number] LICA, not sure what f/u plan was based on this result but will need f/u at d/c. Please call when patient is discharged so we can get him back in to see Dr. Gwenlyn Found.  3. HTN - follow BP for now given reported drop during HD.  Signed, Charlie Pitter PA-C 10/08/2015, 11:33 AM Pager: [phone]  Patient seen, examined. Available data reviewed. Agree with findings, assessment, and plan as outlined by Melina Copa, PA-C. Exam reveals age-appropriate male in NAD, lungs CTA< heart RRR without murmur, carotids with bilateral bruits. JVP elevated. Abdomen soft, NT, +BS, ext: no edema. EKG shows NSR, no acute ST changes, no signs of pericarditis. Troponin (-). Cath last year  showed no CAD. Recommend check 2D echo, otherwise no further cardiac evaluation indicated. Possible that hypotension contributed to symptoms/presentation during dialysis.  Sherren Mocha, M.D. 10/08/2015 12:57 PM

## 2015-10-09 ENCOUNTER — Observation Stay (HOSPITAL_COMMUNITY): Payer: BC Managed Care – PPO

## 2015-10-09 DIAGNOSIS — R072 Precordial pain: Secondary | ICD-10-CM | POA: Diagnosis not present

## 2015-10-09 DIAGNOSIS — I779 Disorder of arteries and arterioles, unspecified: Secondary | ICD-10-CM

## 2015-10-09 DIAGNOSIS — I739 Peripheral vascular disease, unspecified: Secondary | ICD-10-CM | POA: Diagnosis not present

## 2015-10-09 DIAGNOSIS — E1159 Type 2 diabetes mellitus with other circulatory complications: Secondary | ICD-10-CM | POA: Diagnosis not present

## 2015-10-09 DIAGNOSIS — R0789 Other chest pain: Secondary | ICD-10-CM

## 2015-10-09 LAB — CBC
HCT: 36.4 % — ABNORMAL LOW (ref 39.0–52.0)
Hemoglobin: 11.9 g/dL — ABNORMAL LOW (ref 13.0–17.0)
MCH: 31.2 pg (ref 26.0–34.0)
MCHC: 32.7 g/dL (ref 30.0–36.0)
MCV: 95.3 fL (ref 78.0–100.0)
Platelets: 232 10*3/uL (ref 150–400)
RBC: 3.82 MIL/uL — ABNORMAL LOW (ref 4.22–5.81)
RDW: 13.6 % (ref 11.5–15.5)
WBC: 6.1 10*3/uL (ref 4.0–10.5)

## 2015-10-09 LAB — ECHOCARDIOGRAM COMPLETE
E decel time: 218 msec
E/e' ratio: 12.49
FS: 25 % — AB (ref 28–44)
Height: 71 in
IVS/LV PW RATIO, ED: 1.28
LA ID, A-P, ES: 40 mm
LA diam end sys: 40 mm
LA diam index: 1.94 cm/m2
LA vol A4C: 61.1 ml
LA vol index: 26.3 mL/m2
LA vol: 54.1 mL
LV E/e' medial: 12.49
LV E/e'average: 12.49
LV PW d: 12.5 mm — AB (ref 0.6–1.1)
LV e' LATERAL: 5.98 cm/s
LVOT SV: 66 mL
LVOT VTI: 23.3 cm
LVOT area: 2.84 cm2
LVOT diameter: 19 mm
LVOT peak vel: 101 cm/s
MV Dec: 218
MV Peak grad: 2 mmHg
MV pk A vel: 92.6 m/s
MV pk E vel: 74.7 m/s
Reg peak vel: 254 cm/s
TAPSE: 22.8 mm
TDI e' lateral: 5.98
TDI e' medial: 3.92
TR max vel: 254 cm/s
Weight: 3025.6 oz

## 2015-10-09 LAB — RENAL FUNCTION PANEL
Albumin: 3.1 g/dL — ABNORMAL LOW (ref 3.5–5.0)
Anion gap: 11 (ref 5–15)
BUN: 39 mg/dL — ABNORMAL HIGH (ref 6–20)
CO2: 30 mmol/L (ref 22–32)
Calcium: 8.2 mg/dL — ABNORMAL LOW (ref 8.9–10.3)
Chloride: 94 mmol/L — ABNORMAL LOW (ref 101–111)
Creatinine, Ser: 11.12 mg/dL — ABNORMAL HIGH (ref 0.61–1.24)
GFR calc Af Amer: 5 mL/min — ABNORMAL LOW (ref >60)
GFR calc non Af Amer: 4 mL/min — ABNORMAL LOW (ref >60)
Glucose, Bld: 79 mg/dL (ref 65–99)
Phosphorus: 5.3 mg/dL — ABNORMAL HIGH (ref 2.5–4.6)
Potassium: 3.7 mmol/L (ref 3.5–5.1)
Sodium: 135 mmol/L (ref 135–145)

## 2015-10-09 LAB — GLUCOSE, CAPILLARY
Glucose-Capillary: 79 mg/dL (ref 65–99)
Glucose-Capillary: 134 mg/dL — ABNORMAL HIGH (ref 65–99)

## 2015-10-09 MED ORDER — SODIUM CHLORIDE 0.9 % IV SOLN: 100.0000 mL | INTRAVENOUS | Status: DC | PRN

## 2015-10-09 MED ORDER — ALTEPLASE 2 MG IJ SOLR: 2.0000 mg | Freq: Once | INTRAMUSCULAR | Status: DC | PRN

## 2015-10-09 MED ORDER — LIDOCAINE HCL (PF) 1 % IJ SOLN: 5.0000 mL | INTRAMUSCULAR | Status: DC | PRN

## 2015-10-09 MED ORDER — LIDOCAINE-PRILOCAINE 2.5-2.5 % EX CREA: 1.0000 "application " | TOPICAL_CREAM | CUTANEOUS | Status: DC | PRN

## 2015-10-09 MED ORDER — CALCIUM ACETATE (PHOS BINDER) 667 MG PO CAPS: 2001.0000 mg | ORAL_CAPSULE | Freq: Three times a day (TID) | ORAL | Status: DC

## 2015-10-09 MED ORDER — HEPARIN SODIUM (PORCINE) 1000 UNIT/ML DIALYSIS: 1000.0000 [IU] | INTRAMUSCULAR | Status: DC | PRN

## 2015-10-09 MED ORDER — PENTAFLUOROPROP-TETRAFLUOROETH EX AERO: 1.0000 "application " | INHALATION_SPRAY | CUTANEOUS | Status: DC | PRN

## 2015-10-09 MED ORDER — HEPARIN SODIUM (PORCINE) 1000 UNIT/ML DIALYSIS: 2000.0000 [IU] | Freq: Once | INTRAMUSCULAR | Status: DC

## 2015-10-09 NOTE — Discharge Summary (Addendum)
Physician Discharge Summary  Jon Anderson [id number] DOB: 10-Sep-1950 DOA: 10/08/2015  PCP: [name], MD  Admit date: 10/08/2015 Discharge date: 10/09/2015  Admitted From: home  Disposition:  home  Recommendations for Outpatient Follow-up:  1. Follow up with PCP in 1-2 weeks 2. BMP/CBC per nephrology  Home Health:  no  Equipment/Devices:  No  Discharge Condition:  Stable, improved CODE STATUS:  Full code  Diet recommendation:  Renal/diabetic   Brief/Interim Summary:  Jon Anderson is a 65 y.o. male, With history of hypertension, hyperlipidemia, diabetes mellitus, end-stage renal disease, peripheral vascular disease, anemia, coronary artery disease with history of normal coronary arteries in cardiac cath 06/2014 , presents with complaints of chest pain, reports chest pain is intermittent, over last week, was relieved by sublingual nitroglycerin received in NAD, accompanied by dyspnea, nausea, currently chest pain-free after receiving sublingual nitroglycerin, had chest pain during hemodialysis session today, received only 1 hour, was sent to ED from dialysis center. - In ED troponins were 0.02, EKG nonacute, chest pain-free after receiving sublingual nitroglycerin, received 4 baby aspirin by EMS, seen by cardiology.  Discharge Diagnoses:  Principal Problem:   Chest pain, non-cardiac Active Problems:   DM (diabetes mellitus) ([place])   Carotid artery disease ([facility])   PVD (peripheral vascular disease) ([facility])   Chest pain   Noncardiac chest pain, the patient has had intermittent chest pains which have been repeatedly evaluated by cardiology. He had a negative cardiac catheterization in March 2016. Telemetry demonstrated normal sinus rhythm. His troponins were repeatedly 0.03. Echocardiogram demonstrated moderate LVH with preserved ejection fraction and no evidence of regional wall motion abnormalities. He has grade 1 diastolic dysfunction and a trivial pericardial effusions that  are probably related to his end-stage renal disease. He was seen by cardiology who agreed to the patient's chest pain is likely noncardiac. He may have had some chest discomfort from hypotension or fluid shifts during hemodialysis. He is advised that he may also have a component of musculoskeletal pain.  Doubt pericarditis since pain is not pleuritic and he is compliant with his HD.  If he experiences similar chest pains in the future, consider a trial of BID PPI with warm compresses to the chest wall.  Continued his daily protonix for now.    Diabetes mellitus type 2, stable on insulin pump  End-stage renal disease, he was sent over emergently from hemodialysis secondary to chest pressure on the date of admission. He did not complete his dialysis. He was given dialysis on 10/09/2015.  Peripheral vascular disease status post PCI to the left SFA. He continued aspirin, Plavix, and statin.  Anemia of chronic any disease, remained stable and is managed per his nephrologist.  Discharge Instructions      Discharge Instructions    Call MD for:  difficulty breathing, headache or visual disturbances    Complete by:  As directed      Call MD for:  extreme fatigue    Complete by:  As directed      Call MD for:  hives    Complete by:  As directed      Call MD for:  persistant dizziness or light-headedness    Complete by:  As directed      Call MD for:  persistant nausea and vomiting    Complete by:  As directed      Call MD for:  severe uncontrolled pain    Complete by:  As directed      Call MD for:  temperature >  100.4    Complete by:  As directed      Diet - low sodium heart healthy    Complete by:  As directed      Increase activity slowly    Complete by:  As directed             Medication List    STOP taking these medications        azithromycin 250 MG tablet  Commonly known as:  ZITHROMAX     HYDROcodone-acetaminophen 5-325 MG tablet  Commonly known as:  NORCO     polyethylene  glycol packet  Commonly known as:  MIRALAX     sodium phosphate 7-19 GM/118ML Enem     sucralfate 1 g tablet  Commonly known as:  CARAFATE      TAKE these medications        aspirin EC 81 MG tablet  Take 1 tablet (81 mg total) by mouth daily.     atorvastatin 40 MG tablet  Commonly known as:  LIPITOR  Take 1 tablet (40 mg total) by mouth daily at 6 PM.     calcium acetate 667 MG capsule  Commonly known as:  PHOSLO  Take 3 capsules (2,001 mg total) by mouth 3 (three) times daily with meals.     cinacalcet 60 MG tablet  Commonly known as:  SENSIPAR  Take 60 mg by mouth daily.     clonazePAM 0.5 MG tablet  Commonly known as:  KLONOPIN  TAKE 1 TABLET BY MOUTH AT BEDTIME     clopidogrel 75 MG tablet  Commonly known as:  PLAVIX  Take 1 tablet (75 mg total) by mouth daily with breakfast.     gabapentin 100 MG capsule  Commonly known as:  NEURONTIN  Take 100-200 mg by mouth See admin instructions. Pt takes 100mg  morning and afternoon, and 200mg  at night     multivitamin Tabs tablet  Take 1 tablet by mouth at bedtime.     nitroGLYCERIN 0.4 MG SL tablet  Commonly known as:  NITROSTAT  Place 1 tablet (0.4 mg total) under the tongue every 5 (five) minutes as needed for chest pain.     NOVOLOG 100 UNIT/ML injection  Generic drug:  insulin aspart  0.75 units per hour via insulin pump per pt - - USE AS DIRECTED (TRITRATE AS NEEDED) *MAX 50 UNITS A DAY*     pantoprazole 40 MG tablet  Commonly known as:  PROTONIX  Take 40 mg by mouth daily.     rOPINIRole 0.5 MG tablet  Commonly known as:  REQUIP  Take 0.5 mg by mouth 3 (three) times daily.       Follow-up Information    Schedule an appointment as soon as possible for a visit with [name], MD.   Specialty:  Internal Medicine   Why:  As needed   Contact information:   [address] [place] [address] [phone]      Allergies  Allergen Reactions  . Kiwi Extract Swelling    Facial swelling      Consultations: Cardiology   Procedures/Studies: Dg Chest 2 View  10/08/2015  CLINICAL DATA:  Chest pain. EXAM: CHEST  2 VIEW COMPARISON:  08/25/2015. FINDINGS: Mediastinum hilar structures are normal. Mild bibasilar atelectasis again noted. Mild right base infiltrate cannot be excluded. No pleural effusion or pneumothorax. Heart size stable. Normal pulmonary vascularity. Stable mild thoracic spine compression fractures. No acute bony abnormality. IMPRESSION: Low lung volumes with mild bibasilar atelectasis again noted.  Mild right base infiltrate cannot be excluded. Electronically Signed   By: Marcello Moores  Register   On: 10/08/2015 11:24    Echocardiogram  Left ventricle: The cavity size was normal. Wall thickness was  increased in a pattern of moderate LVH. Systolic function was  normal. The estimated ejection fraction was in the range of 60%  to 65%. Wall motion was normal; there were no regional wall  motion abnormalities. Doppler parameters are consistent with  abnormal left ventricular relaxation (grade 1 diastolic  dysfunction). - Mitral valve: Mildly calcified annulus. - Left atrium: The atrium was mildly dilated. - Pericardium, extracardiac: A trivial pericardial effusion was  identified posterior to the heart.   Subjective: Patient states they he did not feel that he was having any cardiac chest pain at dialysis, however the nurses at the dialysis center were concerned that his chest pressure did not improve after they stopped his hemodialysis for several minutes. He is asking to go home.  Discharge Exam: Filed Vitals:   10/09/15 1503 10/09/15 1601  BP: 123/73 93/65  Pulse: 78 73  Temp:  98.1 F (36.7 C)  Resp:  12   Filed Vitals:   10/09/15 1402 10/09/15 1433 10/09/15 1503 10/09/15 1601  BP: 143/92 135/78 123/73 93/65  Pulse: 74 84 78 73  Temp:    98.1 F (36.7 C)  TempSrc:    Oral  Resp:    12  Height:      Weight:    84.4 kg (186 lb 1.1 oz)  SpO2:     96%    General: Pt is alert, awake, not in acute distress Cardiovascular: RRR, no rubs, no gallops Respiratory: CTA bilaterally, no wheezing, no rhonchi Abdominal: Soft, NT, ND, bowel sounds + Extremities: no edema, no cyanosis    The results of significant diagnostics from this hospitalization (including imaging, microbiology, ancillary and laboratory) are listed below for reference.     Microbiology: No results found for this or any previous visit (from the past 240 hour(s)).   Labs: BNP (last 3 results) No results for input(s): BNP in the last 8760 hours. Basic Metabolic Panel:  Recent Labs Lab 10/08/15 0915 10/09/15 0754  NA 136 135  K 3.9 3.7  CL 95* 94*  CO2 31 30  GLUCOSE 126* 79  BUN 28* 39*  CREATININE 8.49* 11.12*  CALCIUM 8.3* 8.2*  PHOS  --  5.3*   Liver Function Tests:  Recent Labs Lab 10/08/15 0915 10/09/15 0754  AST 17  --   ALT 20  --   ALKPHOS 74  --   BILITOT 0.4  --   PROT 7.6  --   ALBUMIN 3.6 3.1*   No results for input(s): LIPASE, AMYLASE in the last 168 hours. No results for input(s): AMMONIA in the last 168 hours. CBC:  Recent Labs Lab 10/08/15 0915 10/09/15 1348  WBC 5.6 6.1  NEUTROABS 3.1  --   HGB 11.6* 11.9*  HCT 35.4* 36.4*  MCV 94.7 95.3  PLT 215 232   Cardiac Enzymes:  Recent Labs Lab 10/08/15 1237 10/08/15 1513 10/08/15 1829  TROPONINI 0.03* 0.03* 0.03*   BNP: Invalid input(s): POCBNP CBG:  Recent Labs Lab 10/08/15 1455 10/08/15 1623 10/08/15 2040 10/09/15 0747 10/09/15 1151  GLUCAP 156* 205* 128* 79 134*   D-Dimer No results for input(s): DDIMER in the last 72 hours. Hgb A1c No results for input(s): HGBA1C in the last 72 hours. Lipid Profile No results for input(s): CHOL, HDL, LDLCALC, TRIG, CHOLHDL, LDLDIRECT  in the last 72 hours. Thyroid function studies No results for input(s): TSH, T4TOTAL, T3FREE, THYROIDAB in the last 72 hours.  Invalid input(s): FREET3 Anemia work up No results for  input(s): VITAMINB12, FOLATE, FERRITIN, TIBC, IRON, RETICCTPCT in the last 72 hours. Urinalysis    Component Value Date/Time   COLORURINE YELLOW 06/18/2014 2308   APPEARANCEUR CLEAR 06/18/2014 2308   LABSPEC 1.012 06/18/2014 2308   PHURINE 7.0 06/18/2014 2308   GLUCOSEU 250* 06/18/2014 2308   HGBUR MODERATE* 06/18/2014 2308   BILIRUBINUR NEGATIVE 06/18/2014 2308   KETONESUR NEGATIVE 06/18/2014 2308   PROTEINUR >300* 06/18/2014 2308   UROBILINOGEN 0.2 06/18/2014 2308   NITRITE NEGATIVE 06/18/2014 2308   LEUKOCYTESUR NEGATIVE 06/18/2014 2308   Sepsis Labs Invalid input(s): PROCALCITONIN,  WBC,  LACTICIDVEN Microbiology No results found for this or any previous visit (from the past 240 hour(s)).   Time coordinating discharge: Over 30 minutes  SIGNED:   Janece Canterbury, MD  [facility] 10/09/2015, 7:10 PM Pager   If 7PM-7AM, please contact night-coverage [email or web address] Password [email or web address]

## 2015-10-09 NOTE — Progress Notes (Signed)
Jon Anderson is a 65 Y/O AA male with ESRD on hemodialysis MWF at [facility]. He signed off HD early 10/08/15 with C/O chest pain without associated SOB/mild nausea. He came to ED for evaluation. Chest pain relieved with one SL NTG. Troponin 0.03 X 3. No EKG changes, CXR unremarkable. Patient as been admitted as observation patient to R/O MI. Cardiology has seen and evaluated. Doubtful that this is ACS.   Patient left HD center after 1 hour 21 minutes of HD. K+ 3.9 today, wt 85.7-is just above EDW. Attempt UFG 0.5-1 liters today. No further C/O chest pain, No edema/JVD, very few bibasilar crackles. We will enter order for short hemodialysis session today to complete his shortened treatment. Please notify us if patient status changes to inpatient and we will consult formally.  Juanell Fairly, NP-C [place] [phone] (pager)  Pt seen, examined and agree w A/P as above.  Kelly Splinter MD [employer] pager [phone]    cell [phone] 10/09/2015, 2:09 PM

## 2015-10-09 NOTE — Progress Notes (Signed)
Subjective:  No recurrent chest pain.  Objective:  Vital Signs in the last 24 hours: BP 154/88 mmHg  Pulse 74  Temp(Src) 98.1 F (36.7 C) (Oral)  Resp 17  Ht 5\' 11"  (1.803 m)  Wt 85.775 kg (189 lb 1.6 oz)  BMI 26.39 kg/m2  SpO2 95%  Physical Exam: Mildly obese black male in no acute distress Lungs:  Clear Cardiac:  Regular rhythm, normal S1 and S2, no S3, no rub, JVD mildly increased Extremities:  No edema present  Intake/Output from previous day: 06/30 0701 - 07/01 0700 In: -  Out: 2 [Urine:2]  Weight Filed Weights   10/08/15 1132 10/09/15 0604  Weight: 86.4 kg (190 lb 7.6 oz) 85.775 kg (189 lb 1.6 oz)    Lab Results: Basic Metabolic Panel:  Recent Labs  10/08/15 0915 10/09/15 0754  NA 136 135  K 3.9 3.7  CL 95* 94*  CO2 31 30  GLUCOSE 126* 79  BUN 28* 39*  CREATININE 8.49* 11.12*   CBC:  Recent Labs  10/08/15 0915  WBC 5.6  NEUTROABS 3.1  HGB 11.6*  HCT 35.4*  MCV 94.7  PLT 215   Cardiac Enzymes: Troponin (Point of Care Test)  Recent Labs  10/08/15 0919  TROPIPOC 0.02   Cardiac Panel (last 3 results)  Recent Labs  10/08/15 1237 10/08/15 1513 10/08/15 1829  TROPONINI 0.03* 0.03* 0.03*    Telemetry: Sinus rhythm on telemetry  Assessment/Plan:  1.  Chest pain with atypical features improved 2.  End-stage renal disease with previously shortened dialysis  Recommendations:  Preliminary look at echo shows no significant large effusion.  Enzymes are negative.  I think he can probably go home cardiac viewpoint.     Kerry Hough  MD [facility]  10/09/2015, 10:38 AM

## 2015-10-09 NOTE — Progress Notes (Signed)
  Echocardiogram 2D Echocardiogram has been performed.  Jon Anderson M 10/09/2015, 11:01 AM

## 2015-10-09 NOTE — Discharge Instructions (Signed)

## 2015-10-09 NOTE — Procedures (Signed)
  I was present at this dialysis session, have reviewed the session itself and made  appropriate changes Kelly Splinter MD [place] pager [phone]    cell [phone] 10/09/2015, 2:07 PM

## 2015-10-28 ENCOUNTER — Encounter: Payer: Self-pay | Admitting: Gastroenterology

## 2015-10-28 ENCOUNTER — Ambulatory Visit (INDEPENDENT_AMBULATORY_CARE_PROVIDER_SITE_OTHER): Payer: BC Managed Care – PPO | Admitting: Gastroenterology

## 2015-10-28 VITALS — BP 118/56 | HR 88 | Ht 71.0 in | Wt 192.0 lb

## 2015-10-28 DIAGNOSIS — N184 Chronic kidney disease, stage 4 (severe): Secondary | ICD-10-CM

## 2015-10-28 DIAGNOSIS — K219 Gastro-esophageal reflux disease without esophagitis: Secondary | ICD-10-CM

## 2015-10-28 DIAGNOSIS — I2510 Atherosclerotic heart disease of native coronary artery without angina pectoris: Secondary | ICD-10-CM

## 2015-10-28 DIAGNOSIS — R1310 Dysphagia, unspecified: Secondary | ICD-10-CM | POA: Diagnosis not present

## 2015-10-28 NOTE — Progress Notes (Signed)
[facility] Consult Note:  History: Jon Anderson 10/28/2015  Referring physician: [name], MD  Reason for consult/chief complaint: Dysphagia; Gastroesophageal Reflux; and change in bowels   Subjective HPI:  Jon Anderson was referred by his nephrologist for about 6 months of intermittent dysphagia. A few times a week, solid food might feel stuck in the lower chest before it finally passes. He has chronic regurgitation with rare episodes of pyrosis. He reports an esophageal dilation done in [facility] several years ago. He has had episodes of chest pain including one requiring [facility] admission about 7 weeks ago. Review of the cardiology consult indicates that it was felt to be noncardiac in nature. He apparently had no significant coronary disease on last catheterization. Jon Anderson's appetite has been good and his weight stable. His medical issues are outlined below. Briefly, he is an insulin requiring diabetic, has peripheral arterial disease with a previous left SFA stent, coronary disease with no prior intervention, and end-stage renal disease on dialysis since February 2017. ROS:  Review of Systems  Constitutional: Negative for appetite change and unexpected weight change.  HENT: Negative for mouth sores and voice change.   Eyes: Negative for pain and redness.  Respiratory: Negative for cough and shortness of breath.   Cardiovascular: Negative for chest pain and palpitations.  Genitourinary: Negative for dysuria and hematuria.  Musculoskeletal: Negative for myalgias and arthralgias.  Skin: Negative for pallor and rash.  Neurological: Negative for weakness and headaches.  Hematological: Negative for adenopathy.     Past Medical History: Past Medical History  Diagnosis Date  . Hyperlipidemia   . HTN (hypertension)     echo 3/10: EF 60%, LAE  . CAD (coronary artery disease)     a.  Myoview 4/11: EF 53%, no scar or ischemia   c. MV 2012 Nl perfusion, apical  thinning.  No ischemia or scar.  EF 49%, appears greater by visual estimate.;  d.  Dob stress echo 12/13:  Negative Dob stress echo. There is no evidence of ischemia.  The LVF is normal. b. Normal cors 2016.  Marland Kitchen Chronic chest pain   . GERD (gastroesophageal reflux disease)   . Carotid stenosis     a. Q000111Q RICA, [id number] LICA by duplex [id number]  . Snores     a. presumed OSA, pt has refused sleep eval in past.  . Peripheral arterial disease (Jon Anderson)     a. s/p PTCA to L SFA.  . Type II diabetes mellitus (Jon Anderson)   . Pneumonia   . Nephrolithiasis     "passed them all"  . ESRD (end stage renal disease) on dialysis (Jon Anderson)   . Anemia of chronic disease      Past Surgical History: Past Surgical History  Procedure Laterality Date  . Angioplasty / stenting femoral Left 12/11/2013    dr berry  . Foot fracture surgery Right   . Colonoscopy w/ biopsies and polypectomy    . Lower extremity angiogram Left 12/11/2013    Procedure: LOWER EXTREMITY ANGIOGRAM;  Surgeon: Jon Harp, MD;  Location: [facility] CATH [facility];  Service: Cardiovascular;  Laterality: Left;  . Av fistula placement Left 03/19/2014    Procedure: CREATION OF ARTERIOVENOUS (AV) FISTULA  LEFT UPPER ARM;  Surgeon: Jon Misty, MD;  Location: [facility];  Service: Vascular;  Laterality: Left;  . Tonsillectomy and adenoidectomy    . Fracture surgery    . Inguinal hernia repair Left   . Cardiac catheterization  2001 and 2010   . Left  heart catheterization with coronary angiogram N/A 06/22/2014    Procedure: LEFT HEART CATHETERIZATION WITH CORONARY ANGIOGRAM;  Surgeon: Jon Sine, MD;  Location: [facility] CATH LAB;  Service: Cardiovascular;  Laterality: N/A;     Family History: Family History  Problem Relation Age of Onset  . Heart attack Sister     died @ 35  . Cancer Mother     died @ 41; unknown type  . Diabetes Brother     deceased  . Esophageal cancer Neg Hx   . Colon cancer Neg Hx   . Pancreatic cancer Neg Hx   . Stomach cancer Neg Hx   .  Cirrhosis Father     alcohol related  . Diabetes Father     Social History: Social History   Social History  . Marital Status: Married    Spouse Name: N/A  . Number of Children: N/A  . Years of Education: N/A   Social History Main Topics  . Smoking status: Former Smoker -- 1.00 packs/day for 2 years    Types: Cigarettes  . Smokeless tobacco: Never Used     Comment: 06/16/2014 "quit smoking cigarettes in the early 1970's"  . Alcohol Use: No  . Drug Use: No  . Sexual Activity: Yes    Birth Control/ Protection: None   Other Topics Concern  . 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  Lives in [place] with his wife.  Denies tobacco, alcohol or IV drug abuse or marijuana or cocaine intake.     Allergies: Allergies  Allergen Reactions  . Kiwi Extract Swelling    Facial swelling   . Tape     Plastic tape-blistering    Outpatient Meds: Current Outpatient Prescriptions  Medication Sig Dispense Refill  . aspirin EC 81 MG tablet Take 1 tablet (81 mg total) by mouth daily.    Marland Kitchen atorvastatin (LIPITOR) 40 MG tablet Take 1 tablet (40 mg total) by mouth daily at 6 PM. 30 tablet 5  . calcium acetate (PHOSLO) 667 MG capsule Take 3 capsules (2,001 mg total) by mouth 3 (three) times daily with meals. 270 capsule 0  . cinacalcet (SENSIPAR) 60 MG tablet Take 60 mg by mouth daily.    . clonazePAM (KLONOPIN) 0.5 MG tablet TAKE 1 TABLET BY MOUTH AT BEDTIME  0  . clopidogrel (PLAVIX) 75 MG tablet Take 1 tablet (75 mg total) by mouth daily with breakfast. 30 tablet 11  . gabapentin (NEURONTIN) 100 MG capsule Take 100-200 mg by mouth See admin instructions. Pt takes 100mg  morning and afternoon, and 200mg  at night  6  . multivitamin (RENA-VIT) TABS tablet Take 1 tablet by mouth at bedtime. 30 tablet 0  . nitroGLYCERIN (NITROSTAT) 0.4 MG SL tablet Place 1 tablet (0.4 mg total) under the tongue every 5 (five) minutes as needed for chest pain.  25 tablet 3  . NOVOLOG 100 UNIT/ML injection 0.75 units per hour via insulin pump per pt - - USE AS DIRECTED (TRITRATE AS NEEDED) *MAX 50 UNITS A DAY*  5  . pantoprazole (PROTONIX) 40 MG tablet Take 40 mg by mouth daily.    Marland Kitchen rOPINIRole (REQUIP) 0.5 MG tablet Take 0.5 mg by mouth 3 (three) times daily.      No current facility-administered medications for this visit.      ___________________________________________________________________ Objective  Exam:  BP 118/56 mmHg  Pulse 88  Ht 5\' 11"  (1.803 m)  Wt 192 lb (87.091 kg)  BMI 26.79 kg/m2   General: this is a(n) Well-appearing man with good muscle mass   Eyes: sclera anicteric, no redness  ENT: oral mucosa moist without lesions, no cervical or supraclavicular lymphadenopathy, upper and lower dentures  CV: RRR without murmur, S1/S2, no JVD, no peripheral edema, good peripheral pulses  Resp: clear to auscultation bilaterally, normal RR and effort noted  GI: soft, no tenderness, with active bowel sounds. No guarding or palpable organomegaly noted.  Skin; warm and dry, no rash or jaundice noted  Neuro: awake, alert and oriented x 3. Normal gross motor function and fluent speech  Labs:  CBC    Component Value Date/Time   WBC 6.1 10/09/2015 1348   RBC 3.82* 10/09/2015 1348   HGB 11.9* 10/09/2015 1348   HCT 36.4* 10/09/2015 1348   PLT 232 10/09/2015 1348   MCV 95.3 10/09/2015 1348   MCH 31.2 10/09/2015 1348   MCHC 32.7 10/09/2015 1348   RDW 13.6 10/09/2015 1348   LYMPHSABS 1.5 10/08/2015 0915   MONOABS 0.6 10/08/2015 0915   EOSABS 0.3 10/08/2015 0915   BASOSABS 0.0 10/08/2015 0915    Radiologic Studies:  See UGIS 07/27/15 - nml and barium tablet passes  Assessment: Encounter Diagnoses  Name Primary?  Marland Kitchen Dysphagia Yes  . Gastroesophageal reflux disease, esophagitis presence not specified   . Coronary artery disease involving native coronary artery of native heart without angina pectoris   . Chronic kidney  disease (CKD), stage IV (severe) (HCC)     Dysphagia that appears to be motility, probably week distal contractions related to GERD. Normal upper GI series indicates that there is not likely be a structural lesion amenable to dilation on upper endoscopy.  Plan:  Time was spent discussing GERD, both its pharmacologic and nonpharmacologic treatment. The limitation of antacid medication was also reviewed. Upper endoscopy is not planned at this point We'll certainly let me know if symptoms worsen  Thank you for the courtesy of this consult.  Please call me with any questions or concerns.  Over half of the 45 minute encounter was spent in counseling and coordination of care.   Topics discussed: GERD and dysphagia  Nelida Meuse III  CC: Jon Panda, MD  Sherren Mocha, M.D. (cardiology)

## 2015-10-28 NOTE — Patient Instructions (Signed)
If you are age 66 or older, your body mass index should be between 23-30. Your Body mass index is 26.79 kg/(m^2). If this is out of the aforementioned range listed, please consider follow up with your Primary Care Provider.  If you are age 57 or younger, your body mass index should be between 19-25. Your Body mass index is 26.79 kg/(m^2). If this is out of the aformentioned range listed, please consider follow up with your Primary Care Provider.   Thank you for choosing [facility]  Dr Wilfrid Lund III     Gastroesophageal Reflux Disease, Adult Normally, food travels down the esophagus and stays in the stomach to be digested. However, when a person has gastroesophageal reflux disease (GERD), food and stomach acid move back up into the esophagus. When this happens, the esophagus becomes sore and inflamed. Over time, GERD can create small holes (ulcers) in the lining of the esophagus.  CAUSES This condition is caused by a problem with the muscle between the esophagus and the stomach (lower esophageal sphincter, or LES). Normally, the LES muscle closes after food passes through the esophagus to the stomach. When the LES is weakened or abnormal, it does not close properly, and that allows food and stomach acid to go back up into the esophagus. The LES can be weakened by certain dietary substances, medicines, and medical conditions, including:  Tobacco use.  Pregnancy.  Having a hiatal hernia.  Heavy alcohol use.  Certain foods and beverages, such as coffee, chocolate, onions, and peppermint. RISK FACTORS This condition is more likely to develop in:  People who have an increased body weight.  People who have connective tissue disorders.  People who use NSAID medicines. SYMPTOMS Symptoms of this condition include:  Heartburn.  Difficult or painful swallowing.  The feeling of having a lump in the throat.  Abitter taste in the mouth.  Bad breath.  Having a large amount of  saliva.  Having an upset or bloated stomach.  Belching.  Chest pain.  Shortness of breath or wheezing.  Ongoing (chronic) cough or a night-time cough.  Wearing away of tooth enamel.  Weight loss. Different conditions can cause chest pain. Make sure to see your health care provider if you experience chest pain. DIAGNOSIS Your health care provider will take a medical history and perform a physical exam. To determine if you have mild or severe GERD, your health care provider may also monitor how you respond to treatment. You may also have other tests, including:  An endoscopy toexamine your stomach and esophagus with a small camera.  A test thatmeasures the acidity level in your esophagus.  A test thatmeasures how much pressure is on your esophagus.  A barium swallow or modified barium swallow to show the shape, size, and functioning of your esophagus. TREATMENT The goal of treatment is to help relieve your symptoms and to prevent complications. Treatment for this condition may vary depending on how severe your symptoms are. Your health care provider may recommend:  Changes to your diet.  Medicine.  Surgery. HOME CARE INSTRUCTIONS Diet  Follow a diet as recommended by your health care provider. This may involve avoiding foods and drinks such as:  Coffee and tea (with or without caffeine).  Drinks that containalcohol.  Energy drinks and sports drinks.  Carbonated drinks or sodas.  Chocolate and cocoa.  Peppermint and mint flavorings.  Garlic and onions.  Horseradish.  Spicy and acidic foods, including peppers, chili powder, curry powder, vinegar, hot sauces, and  barbecue sauce.  Citrus fruit juices and citrus fruits, such as oranges, lemons, and limes.  Tomato-based foods, such as red sauce, chili, salsa, and pizza with red sauce.  Fried and fatty foods, such as donuts, french fries, potato chips, and high-fat dressings.  High-fat meats, such as hot dogs  and fatty cuts of red and white meats, such as rib eye steak, sausage, ham, and bacon.  High-fat dairy items, such as whole milk, butter, and cream cheese.  Eat small, frequent meals instead of large meals.  Avoid drinking large amounts of liquid with your meals.  Avoid eating meals during the 2-3 hours before bedtime.  Avoid lying down right after you eat.  Do not exercise right after you eat. General Instructions  Pay attention to any changes in your symptoms.  Take over-the-counter and prescription medicines only as told by your health care provider. Do not take aspirin, ibuprofen, or other NSAIDs unless your health care provider told you to do so.  Do not use any tobacco products, including cigarettes, chewing tobacco, and e-cigarettes. If you need help quitting, ask your health care provider.  Wear loose-fitting clothing. Do not wear anything tight around your waist that causes pressure on your abdomen.  Raise (elevate) the head of your bed 6 inches (15cm).  Try to reduce your stress, such as with yoga or meditation. If you need help reducing stress, ask your health care provider.  If you are overweight, reduce your weight to an amount that is healthy for you. Ask your health care provider for guidance about a safe weight loss goal.  Keep all follow-up visits as told by your health care provider. This is important. SEEK MEDICAL CARE IF:  You have new symptoms.  You have unexplained weight loss.  You have difficulty swallowing, or it hurts to swallow.  You have wheezing or a persistent cough.  Your symptoms do not improve with treatment.  You have a hoarse voice. SEEK IMMEDIATE MEDICAL CARE IF:  You have pain in your arms, neck, jaw, teeth, or back.  You feel sweaty, dizzy, or light-headed.  You have chest pain or shortness of breath.  You vomit and your vomit looks like blood or coffee grounds.  You faint.  Your stool is bloody or black.  You cannot  swallow, drink, or eat.   This information is not intended to replace advice given to you by your health care provider. Make sure you discuss any questions you have with your health care provider.   Document Released: 01/04/2005 Document Revised: 12/16/2014 Document Reviewed: 07/22/2014 Elsevier Interactive Patient Education [employer].

## 2015-11-23 ENCOUNTER — Encounter: Payer: Self-pay | Admitting: Podiatry

## 2015-11-23 ENCOUNTER — Ambulatory Visit (INDEPENDENT_AMBULATORY_CARE_PROVIDER_SITE_OTHER): Payer: BC Managed Care – PPO | Admitting: Podiatry

## 2015-11-23 DIAGNOSIS — M79676 Pain in unspecified toe(s): Secondary | ICD-10-CM | POA: Diagnosis not present

## 2015-11-23 DIAGNOSIS — Q828 Other specified congenital malformations of skin: Secondary | ICD-10-CM

## 2015-11-23 DIAGNOSIS — E1142 Type 2 diabetes mellitus with diabetic polyneuropathy: Secondary | ICD-10-CM

## 2015-11-23 DIAGNOSIS — B351 Tinea unguium: Secondary | ICD-10-CM | POA: Diagnosis not present

## 2015-11-23 NOTE — Progress Notes (Signed)
He presents today for routine nail and callus debridement bilaterally. It also will see about getting scan for a new set of diabetic shoes.  Objective: Vital signs are stable alert and oriented 3 pulses nonpalpable bilateral calluses plantar aspect of bilateral foot and medial aspect of the first metatarsophalangeal joints. Toenails are thick yellow dystrophic mycotic. Hallux malleus is also noted left greater than right. Preoperative lesion hallux left.  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 History of mallet toe and hammertoe deformities painlessly under onychomycosis and porokeratosis. Preoperative lesion hallux left.  Plan: Debridement of all reactive hyperkeratoses bilateral secondary to diabetes and debridement of nails 1 through 5 bilateral secondary to onychomycosis and pain. Also he was scheduled today for a new set of diabetic shoes.

## 2015-12-17 DIAGNOSIS — R351 Nocturia: Secondary | ICD-10-CM | POA: Insufficient documentation

## 2015-12-17 DIAGNOSIS — R350 Frequency of micturition: Secondary | ICD-10-CM | POA: Insufficient documentation

## 2016-02-14 ENCOUNTER — Ambulatory Visit: Payer: BC Managed Care – PPO

## 2016-02-22 ENCOUNTER — Ambulatory Visit (INDEPENDENT_AMBULATORY_CARE_PROVIDER_SITE_OTHER): Payer: BC Managed Care – PPO | Admitting: Podiatry

## 2016-02-22 ENCOUNTER — Encounter: Payer: Self-pay | Admitting: Podiatry

## 2016-02-22 DIAGNOSIS — B351 Tinea unguium: Secondary | ICD-10-CM | POA: Diagnosis not present

## 2016-02-22 DIAGNOSIS — M79676 Pain in unspecified toe(s): Secondary | ICD-10-CM | POA: Diagnosis not present

## 2016-02-22 DIAGNOSIS — Q828 Other specified congenital malformations of skin: Secondary | ICD-10-CM | POA: Diagnosis not present

## 2016-02-22 DIAGNOSIS — E1142 Type 2 diabetes mellitus with diabetic polyneuropathy: Secondary | ICD-10-CM

## 2016-02-22 NOTE — Progress Notes (Signed)
He presents today chief complaint painful elongated toenails and calluses bilateral.  Objective: Vital signs stable he is alert and oriented 3. Pulses are slightly diminished neurologic sensorium is slightly diminished toenails are thick yellow dystrophic onychomycotic bilateral. Multiple reactive porokeratotic lesions plantar aspect of the bilateral foot no open lesions or wounds are noted. No signs of infection.  Assessment: Pain in limb secondary to diabetes of a peripheral neuropathy and angiopathy hammertoe deformities pain in limb segment onychomycosis and porokeratosis.  Plan: Debrided nails 1 through 5 bilateral. Debridement of all reactive hyperkeratosis bilateral follow up with him once his diabetic shoes command.

## 2016-02-24 ENCOUNTER — Ambulatory Visit: Payer: BC Managed Care – PPO | Admitting: Podiatry

## 2016-03-21 ENCOUNTER — Ambulatory Visit: Payer: Medicare Other

## 2016-03-28 ENCOUNTER — Ambulatory Visit (INDEPENDENT_AMBULATORY_CARE_PROVIDER_SITE_OTHER): Payer: BC Managed Care – PPO | Admitting: Podiatry

## 2016-03-28 DIAGNOSIS — M2040 Other hammer toe(s) (acquired), unspecified foot: Secondary | ICD-10-CM

## 2016-03-28 DIAGNOSIS — Q828 Other specified congenital malformations of skin: Secondary | ICD-10-CM | POA: Diagnosis not present

## 2016-03-28 DIAGNOSIS — E1142 Type 2 diabetes mellitus with diabetic polyneuropathy: Secondary | ICD-10-CM

## 2016-03-28 NOTE — Progress Notes (Signed)
Patient presents for diabetic shoe pick up, shoes are tried on for good fit.  Patient received 1 Pair and 3 pairs custom molded diabetic inserts.  Verbal and written break in and wear instructions given.  Patient will follow up for scheduled routine care.   

## 2016-03-28 NOTE — Patient Instructions (Signed)

## 2016-04-10 HISTORY — PX: CORONARY ARTERY BYPASS GRAFT: SHX141

## 2016-04-17 DIAGNOSIS — R52 Pain, unspecified: Secondary | ICD-10-CM | POA: Insufficient documentation

## 2016-04-18 ENCOUNTER — Emergency Department (HOSPITAL_COMMUNITY)
Admission: EM | Admit: 2016-04-18 | Discharge: 2016-04-18 | Disposition: A | Discharge status: Home / Self Care | Payer: BC Managed Care – PPO | Attending: Emergency Medicine | Admitting: Emergency Medicine

## 2016-04-18 ENCOUNTER — Encounter (HOSPITAL_COMMUNITY): Payer: Self-pay | Admitting: Emergency Medicine

## 2016-04-18 ENCOUNTER — Emergency Department (HOSPITAL_COMMUNITY): Payer: BC Managed Care – PPO

## 2016-04-18 DIAGNOSIS — N186 End stage renal disease: Secondary | ICD-10-CM | POA: Diagnosis not present

## 2016-04-18 DIAGNOSIS — Z992 Dependence on renal dialysis: Secondary | ICD-10-CM | POA: Insufficient documentation

## 2016-04-18 DIAGNOSIS — Z87891 Personal history of nicotine dependence: Secondary | ICD-10-CM | POA: Insufficient documentation

## 2016-04-18 DIAGNOSIS — R0789 Other chest pain: Secondary | ICD-10-CM | POA: Insufficient documentation

## 2016-04-18 DIAGNOSIS — Z7982 Long term (current) use of aspirin: Secondary | ICD-10-CM | POA: Diagnosis not present

## 2016-04-18 DIAGNOSIS — Z794 Long term (current) use of insulin: Secondary | ICD-10-CM | POA: Diagnosis not present

## 2016-04-18 DIAGNOSIS — M5441 Lumbago with sciatica, right side: Secondary | ICD-10-CM | POA: Insufficient documentation

## 2016-04-18 DIAGNOSIS — M5431 Sciatica, right side: Secondary | ICD-10-CM

## 2016-04-18 DIAGNOSIS — I120 Hypertensive chronic kidney disease with stage 5 chronic kidney disease or end stage renal disease: Secondary | ICD-10-CM | POA: Diagnosis not present

## 2016-04-18 DIAGNOSIS — I2510 Atherosclerotic heart disease of native coronary artery without angina pectoris: Secondary | ICD-10-CM | POA: Insufficient documentation

## 2016-04-18 DIAGNOSIS — E1122 Type 2 diabetes mellitus with diabetic chronic kidney disease: Secondary | ICD-10-CM | POA: Diagnosis not present

## 2016-04-18 DIAGNOSIS — R079 Chest pain, unspecified: Secondary | ICD-10-CM

## 2016-04-18 LAB — BASIC METABOLIC PANEL
Anion gap: 12 (ref 5–15)
BUN: 40 mg/dL — ABNORMAL HIGH (ref 6–20)
CO2: 26 mmol/L (ref 22–32)
Calcium: 9.5 mg/dL (ref 8.9–10.3)
Chloride: 98 mmol/L — ABNORMAL LOW (ref 101–111)
Creatinine, Ser: 10.27 mg/dL — ABNORMAL HIGH (ref 0.61–1.24)
GFR calc Af Amer: 5 mL/min — ABNORMAL LOW (ref >60)
GFR calc non Af Amer: 5 mL/min — ABNORMAL LOW (ref >60)
Glucose, Bld: 78 mg/dL (ref 65–99)
Potassium: 4.2 mmol/L (ref 3.5–5.1)
Sodium: 136 mmol/L (ref 135–145)

## 2016-04-18 LAB — CBC
HCT: 32.8 % — ABNORMAL LOW (ref 39.0–52.0)
Hemoglobin: 11 g/dL — ABNORMAL LOW (ref 13.0–17.0)
MCH: 30 pg (ref 26.0–34.0)
MCHC: 33.5 g/dL (ref 30.0–36.0)
MCV: 89.4 fL (ref 78.0–100.0)
Platelets: 189 10*3/uL (ref 150–400)
RBC: 3.67 MIL/uL — ABNORMAL LOW (ref 4.22–5.81)
RDW: 13.5 % (ref 11.5–15.5)
WBC: 7.1 10*3/uL (ref 4.0–10.5)

## 2016-04-18 LAB — I-STAT TROPONIN, ED: Troponin i, poc: 0.03 ng/mL (ref 0.00–0.08)

## 2016-04-18 MED ORDER — TRAMADOL HCL 50 MG PO TABS: 50.0000 mg | ORAL_TABLET | Freq: Four times a day (QID) | ORAL | 0 refills | Status: DC | PRN

## 2016-04-18 MED ORDER — TRAMADOL HCL 50 MG PO TABS
50.0000 mg | ORAL_TABLET | Freq: Once | ORAL | Status: AC
  Administered 2016-04-18: 50 mg via ORAL
  Filled 2016-04-18: qty 1

## 2016-04-18 NOTE — ED Provider Notes (Signed)
[facility] DEPT Provider Note   CSN: [id number] Arrival date & time: 04/18/16  2458     History   Chief Complaint Chief Complaint  Patient presents with  . Chest Pain    HPI Jon Anderson is a 66 y.o. male.  He presents for evaluation of a heaviness in his chest, which has been present since yesterday. He denies N/V, shortness of breath, weakness or dizziness. He also has been having soreness in the right lower back, right buttock and right leg since bending over to pick up his grandchild several days ago. He denies change in bowel or bladder function. There is been no fever, paresthesia or focal weakness. His last dialysis treatment, was yesterday. There are no other known modifying factors.  HPI  Past Medical History:  Diagnosis Date  . Anemia of chronic disease   . CAD (coronary artery disease)    a.  Myoview 4/11: EF 53%, no scar or ischemia   c. MV 2012 Nl perfusion, apical thinning.  No ischemia or scar.  EF 49%, appears greater by visual estimate.;  d.  Dob stress echo 12/13:  Negative Dob stress echo. There is no evidence of ischemia.  The LVF is normal. b. Normal cors 2016.  . Carotid stenosis    a. <09% RICA, >98% LICA by duplex 06/3823  . Chronic chest pain   . ESRD (end stage renal disease) on dialysis ([place])   . GERD (gastroesophageal reflux disease)   . HTN (hypertension)    echo 3/10: EF 60%, LAE  . Hyperlipidemia   . Nephrolithiasis    "passed them all"  . Peripheral arterial disease ([place])    a. s/p PTCA to L SFA.  Marland Kitchen Pneumonia   . Snores    a. presumed OSA, pt has refused sleep eval in past.  . Type II diabetes mellitus [facility])     Patient Active Problem List   Diagnosis Date Noted  . Chest pain, non-cardiac 10/09/2015  . Acute on chronic renal failure ([place]) 06/16/2014  . Renal failure (ARF), acute on chronic (HCC) 06/16/2014  . Shoulder pain, left 12/15/2013  . Chest pain 12/15/2013  . Claudication ([place]) 12/11/2013  . PVD (peripheral vascular  disease) ([place]) 12/11/2013  . Peripheral arterial disease ([place]) 09/30/2013  . Carotid artery disease ([place]) 09/30/2013  . Acute chest pain 11/15/2012  . Left-sided chest wall pain 03/19/2012  . Bruit 09/15/2010  . CAD (coronary artery disease) nonobstructive per cath 2012   . Chronic kidney disease (CKD), stage IV (severe) ([place])   . CHEST PAIN, PRECORDIAL 07/26/2009  . DM (diabetes mellitus) ([place]) 07/22/2009  . Hyperlipidemia LDL goal <70 07/22/2009  . Hypertension 07/22/2009    Past Surgical History:  Procedure Laterality Date  . ANGIOPLASTY / STENTING FEMORAL Left 12/11/2013   dr berry  . AV FISTULA PLACEMENT Left 03/19/2014   Procedure: CREATION OF ARTERIOVENOUS (AV) FISTULA  LEFT UPPER ARM;  Surgeon: Mal Misty, MD;  Location: [facility];  Service: Vascular;  Laterality: Left;  . CARDIAC CATHETERIZATION  2001 and 2010   . COLONOSCOPY W/ BIOPSIES AND POLYPECTOMY    . FOOT FRACTURE SURGERY Right   . FRACTURE SURGERY    . INGUINAL HERNIA REPAIR Left   . LEFT HEART CATHETERIZATION WITH CORONARY ANGIOGRAM N/A 06/22/2014   Procedure: LEFT HEART CATHETERIZATION WITH CORONARY ANGIOGRAM;  Surgeon: Troy Sine, MD;  Location: [facility] CATH LAB;  Service: Cardiovascular;  Laterality: N/A;  . LOWER EXTREMITY ANGIOGRAM Left 12/11/2013   Procedure:  LOWER EXTREMITY ANGIOGRAM;  Surgeon: Lorretta Harp, MD;  Location: [facility] CATH LAB;  Service: Cardiovascular;  Laterality: Left;  . TONSILLECTOMY AND ADENOIDECTOMY         Home Medications    Prior to Admission medications   Medication Sig Start Date End Date Taking? Authorizing Provider  aspirin EC 81 MG tablet Take 1 tablet (81 mg total) by mouth daily. 05/28/12  Yes Liliane Shi, PA-C  atorvastatin (LIPITOR) 40 MG tablet Take 1 tablet (40 mg total) by mouth daily at 6 PM. 12/13/13  Yes Brett Canales, PA-C  calcium acetate (PHOSLO) 667 MG capsule Take 3 capsules (2,001 mg total) by mouth 3 (three) times daily with meals. 10/09/15  Yes Janece Canterbury,  MD  cinacalcet (SENSIPAR) 60 MG tablet Take 60 mg by mouth daily.   Yes Historical Provider, MD  clonazePAM (KLONOPIN) 0.5 MG tablet TAKE 1 TABLET BY MOUTH AT BEDTIME 09/22/14  Yes Historical Provider, MD  clopidogrel (PLAVIX) 75 MG tablet Take 1 tablet (75 mg total) by mouth daily with breakfast. 12/13/13  Yes Brett Canales, PA-C  gabapentin (NEURONTIN) 100 MG capsule Take 100-200 mg by mouth See admin instructions. Pt takes 100mg  morning and afternoon, and 200mg  at night 10/02/14  Yes Historical Provider, MD  insulin aspart (NOVOLOG) 100 UNIT/ML injection Inject 0.5 Units into the skin daily as needed for high blood sugar. Pt is currently out of medication for pump. Pt is using novolog injections in its place.   Yes Historical Provider, MD  multivitamin (RENA-VIT) TABS tablet Take 1 tablet by mouth at bedtime. 06/23/14  Yes Domenic Polite, MD  nitroGLYCERIN (NITROSTAT) 0.4 MG SL tablet Place 1 tablet (0.4 mg total) under the tongue every 5 (five) minutes as needed for chest pain. 03/20/12  Yes Rhonda G Barrett, PA-C  pantoprazole (PROTONIX) 40 MG tablet Take 40 mg by mouth daily.   Yes Historical Provider, MD  rOPINIRole (REQUIP) 0.5 MG tablet Take 0.5 mg by mouth 3 (three) times daily.    Yes Historical Provider, MD  NOVOLOG 100 UNIT/ML injection 0.75 units per hour via insulin pump per pt - - USE AS DIRECTED (TRITRATE AS NEEDED) *MAX 50 UNITS A DAY* 08/28/14   Historical Provider, MD  traMADol (ULTRAM) 50 MG tablet Take 1 tablet (50 mg total) by mouth every 6 (six) hours as needed. 04/18/16   Daleen Bo, MD    Family History Family History  Problem Relation Age of Onset  . Heart attack Sister     died @ 70  . Cancer Mother     died @ 63; unknown type  . Diabetes Brother     deceased  . Cirrhosis Father     alcohol related  . Diabetes Father   . Esophageal cancer Neg Hx   . Colon cancer Neg Hx   . Pancreatic cancer Neg Hx   . Stomach cancer Neg Hx     Social History Social History    Substance Use Topics  . Smoking status: Former Smoker    Packs/day: 1.00    Years: 2.00    Types: Cigarettes  . Smokeless tobacco: Never Used     Comment: 06/16/2014 "quit smoking cigarettes in the early 1970's"  . Alcohol use No     Allergies   Kiwi extract and Tape   Review of Systems Review of Systems  All other systems reviewed and are negative.    Physical Exam Updated Vital Signs BP 193/90   Pulse 83  Temp 97.9 F (36.6 C) (Oral)   Resp (!) 27   Ht 5\' 11"  (1.803 m)   Wt 195 lb (88.5 kg)   SpO2 95%   BMI 27.20 kg/m   Physical Exam  Constitutional: He is oriented to person, place, and time. He appears well-developed and well-nourished.  Non-toxic appearance. He does not have a sickly appearance. He appears distressed (He is anxious).  HENT:  Head: Normocephalic and atraumatic.  Right Ear: External ear normal.  Left Ear: External ear normal.  Eyes: Conjunctivae and EOM are normal. Pupils are equal, round, and reactive to light.  Neck: Normal range of motion and phonation normal. Neck supple.  Cardiovascular: Normal rate, regular rhythm and normal heart sounds.   Pulmonary/Chest: Effort normal and breath sounds normal. No respiratory distress. He exhibits no tenderness and no bony tenderness.  Abdominal: Soft. There is no tenderness.  Musculoskeletal: Normal range of motion.  Mild tenderness right lower back and right buttock region.  Neurological: He is alert and oriented to person, place, and time. No cranial nerve deficit or sensory deficit. He exhibits normal muscle tone. Coordination normal.  Skin: Skin is warm, dry and intact.  Psychiatric: He has a normal mood and affect. His behavior is normal. Judgment and thought content normal.  Nursing note and vitals reviewed.    ED Treatments / Results  Labs (all labs ordered are listed, but only abnormal results are displayed) Labs Reviewed  BASIC METABOLIC PANEL - Abnormal; Notable for the following:        Result Value   Chloride 98 (*)    BUN 40 (*)    Creatinine, Ser 10.27 (*)    GFR calc non Af Amer 5 (*)    GFR calc Af Amer 5 (*)    All other components within normal limits  CBC - Abnormal; Notable for the following:    RBC 3.67 (*)    Hemoglobin 11.0 (*)    HCT 32.8 (*)    All other components within normal limits  I-STAT TROPOININ, ED    EKG  EKG Interpretation  Date/Time:  Tuesday April 18 2016 08:19:33 EST Ventricular Rate:  82 PR Interval:  130 QRS Duration: 94 QT Interval:  358 QTC Calculation: 418 R Axis:   90 Text Interpretation:  Normal sinus rhythm Rightward axis Septal infarct , age undetermined ST & T wave abnormality, consider inferolateral ischemia Abnormal ECG new inferolateral ST depressions  Confirmed by LIU MD, Hinton Dyer ([id number]) on 04/18/2016 8:28:00 AM Also confirmed by LIU MD, DANA [phone]), editor [place], Joelene Millin [phone])  on 04/18/2016 8:42:47 AM       Radiology Dg Chest 2 View  Result Date: 04/18/2016 CLINICAL DATA:  Shortness of breath for 2 days.  Wheezing. EXAM: CHEST  2 VIEW COMPARISON:  10/08/2015 FINDINGS: Atherosclerotic calcification of the aortic arch. Small central eventration of the right hemidiaphragm. Mild enlargement of the cardiopericardial silhouette, without edema. Mild blunting of the right posterior costophrenic angle which seems to be chronic. IMPRESSION: 1.  No active cardiopulmonary disease is radiographically apparent. 2. Chronic blunting of the right posterior costophrenic angle. 3. Mild enlargement of the cardiopericardial silhouette, without edema. 4. Atherosclerotic aortic arch. Electronically Signed   By: Van Clines M.D.   On: 04/18/2016 09:23    Procedures Procedures (including critical care time)  Medications Ordered in ED Medications  traMADol (ULTRAM) tablet 50 mg (50 mg Oral Given 04/18/16 1316)     Initial Impression / Assessment and Plan / ED Course  I have reviewed the triage vital signs and the nursing  notes.  Pertinent labs & imaging results that were available during my care of the patient were reviewed by me and considered in my medical decision making (see chart for details).  Clinical Course     Medications  traMADol (ULTRAM) tablet 50 mg (50 mg Oral Given 04/18/16 1316)    Patient Vitals for the past 24 hrs:  BP Temp Temp src Pulse Resp SpO2 Height Weight  04/18/16 1300 193/90 - - 83 (!) 27 95 % - -  04/18/16 1230 177/91 - - 80 23 97 % - -  04/18/16 1215 169/85 - - 74 20 92 % - -  04/18/16 1200 170/90 - - 73 21 94 % - -  04/18/16 1050 145/83 97.9 F (36.6 C) Oral 71 17 95 % - -  04/18/16 0827 - - - - - - 5\' 11"  (1.803 m) 195 lb (88.5 kg)  04/18/16 0826 158/86 98.4 F (36.9 C) Oral 83 16 97 % - -    At D/C Reevaluation with update and discussion. After initial assessment and treatment, an updated evaluation reveals He remains comfortable, no additional complaints. Findings discussed with patient, all questions answered. Laramie Meissner L    Final Clinical Impressions(s) / ED Diagnoses   Final diagnoses:  Sciatica of right side  Nonspecific chest pain    Nonspecific chest pain. Most recent cardiac catheterization showed normal coronary arteries. Doubt ACS, PE or pneumonia. Apparent sciatica, likely related to a mildly traumatic incident.  Nursing Notes Reviewed/ Care Coordinated Applicable Imaging Reviewed Interpretation of Laboratory Data incorporated into ED treatment  The patient appears reasonably screened and/or stabilized for discharge and I doubt any other medical condition or other [facility] requiring further screening, evaluation, or treatment in the ED at this time prior to discharge.  Plan: Home Medications- continue; Home Treatments- rest, heat; return here if the recommended treatment, does not improve the symptoms; Recommended follow up- PCP prn  New Prescriptions Discharge Medication List as of 04/18/2016  1:24 PM    START taking these medications   Details   traMADol (ULTRAM) 50 MG tablet Take 1 tablet (50 mg total) by mouth every 6 (six) hours as needed., Starting Tue 04/18/2016, Print         Daleen Bo, MD 04/18/16 1815

## 2016-04-18 NOTE — ED Notes (Signed)
Pt is in stable condition upon d/c and is escorted from ED via wheelchair. 

## 2016-04-18 NOTE — ED Triage Notes (Signed)
Pt c/o cp and difficulty breathing while lying down, also c/o restless leg. States he has dialysis done Monday. Pt appears SOB.

## 2016-04-18 NOTE — ED Provider Notes (Signed)
[facility] DEPT Provider Note   CSN: [id number] Arrival date & time: 04/18/16  0254     History   Chief Complaint Chief Complaint  Patient presents with  . Chest Pain    HPI Jon Anderson is a 66 y.o. male.  HPI  Past Medical History:  Diagnosis Date  . Anemia of chronic disease   . CAD (coronary artery disease)    a.  Myoview 4/11: EF 53%, no scar or ischemia   c. MV 2012 Nl perfusion, apical thinning.  No ischemia or scar.  EF 49%, appears greater by visual estimate.;  d.  Dob stress echo 12/13:  Negative Dob stress echo. There is no evidence of ischemia.  The LVF is normal. b. Normal cors 2016.  . Carotid stenosis    a. <27% RICA, >06% LICA by duplex 05/3760  . Chronic chest pain   . ESRD (end stage renal disease) on dialysis ([place])   . GERD (gastroesophageal reflux disease)   . HTN (hypertension)    echo 3/10: EF 60%, LAE  . Hyperlipidemia   . Nephrolithiasis    "passed them all"  . Peripheral arterial disease ([place])    a. s/p PTCA to L SFA.  Marland Kitchen Pneumonia   . Snores    a. presumed OSA, pt has refused sleep eval in past.  . Type II diabetes mellitus [facility])     Patient Active Problem List   Diagnosis Date Noted  . Chest pain, non-cardiac 10/09/2015  . Acute on chronic renal failure ([place]) 06/16/2014  . Renal failure (ARF), acute on chronic (HCC) 06/16/2014  . Shoulder pain, left 12/15/2013  . Chest pain 12/15/2013  . Claudication ([place]) 12/11/2013  . PVD (peripheral vascular disease) ([place]) 12/11/2013  . Peripheral arterial disease ([place]) 09/30/2013  . Carotid artery disease ([place]) 09/30/2013  . Acute chest pain 11/15/2012  . Left-sided chest wall pain 03/19/2012  . Bruit 09/15/2010  . CAD (coronary artery disease) nonobstructive per cath 2012   . Chronic kidney disease (CKD), stage IV (severe) ([place])   . CHEST PAIN, PRECORDIAL 07/26/2009  . DM (diabetes mellitus) ([place]) 07/22/2009  . Hyperlipidemia LDL goal <70 07/22/2009  . Hypertension 07/22/2009     Past Surgical History:  Procedure Laterality Date  . ANGIOPLASTY / STENTING FEMORAL Left 12/11/2013   dr berry  . AV FISTULA PLACEMENT Left 03/19/2014   Procedure: CREATION OF ARTERIOVENOUS (AV) FISTULA  LEFT UPPER ARM;  Surgeon: Mal Misty, MD;  Location: [facility];  Service: Vascular;  Laterality: Left;  . CARDIAC CATHETERIZATION  2001 and 2010   . COLONOSCOPY W/ BIOPSIES AND POLYPECTOMY    . FOOT FRACTURE SURGERY Right   . FRACTURE SURGERY    . INGUINAL HERNIA REPAIR Left   . LEFT HEART CATHETERIZATION WITH CORONARY ANGIOGRAM N/A 06/22/2014   Procedure: LEFT HEART CATHETERIZATION WITH CORONARY ANGIOGRAM;  Surgeon: Troy Sine, MD;  Location[facility] C[facility] LAB;  Service: Cardiovascular;  Laterality: N/A;  . LOWER EXTREMITY ANGIOGRAM Left 12/11/2013   Procedure: LOWER EXTREMITY ANGIOGRAM;  Surgeon: Lorretta Harp, MD;  Location: [facility] CATH LAB;  Service: Cardiovascular;  Laterality: Left;  . TONSILLECTOMY AND ADENOIDECTOMY         Home Medications    Prior to Admission medications   Medication Sig Start Date End Date Taking? Authorizing Provider  aspirin EC 81 MG tablet Take 1 tablet (81 mg total) by mouth daily. 05/28/12  Yes Scott Joylene Draft, PA-C  atorvastatin (LIPITOR) 40 MG tablet Take 1  tablet (40 mg total) by mouth daily at 6 PM. 12/13/13  Yes Brett Canales, PA-C  calcium acetate (PHOSLO) 667 MG capsule Take 3 capsules (2,001 mg total) by mouth 3 (three) times daily with meals. 10/09/15  Yes Janece Canterbury, MD  cinacalcet (SENSIPAR) 60 MG tablet Take 60 mg by mouth daily.   Yes Historical Provider, MD  clonazePAM (KLONOPIN) 0.5 MG tablet TAKE 1 TABLET BY MOUTH AT BEDTIME 09/22/14  Yes Historical Provider, MD  clopidogrel (PLAVIX) 75 MG tablet Take 1 tablet (75 mg total) by mouth daily with breakfast. 12/13/13  Yes Brett Canales, PA-C  gabapentin (NEURONTIN) 100 MG capsule Take 100-200 mg by mouth See admin instructions. Pt takes 100mg  morning and afternoon, and 200mg  at night 10/02/14   Yes Historical Provider, MD  insulin aspart (NOVOLOG) 100 UNIT/ML injection Inject 0.5 Units into the skin daily as needed for high blood sugar. Pt is currently out of medication for pump. Pt is using novolog injections in its place.   Yes Historical Provider, MD  multivitamin (RENA-VIT) TABS tablet Take 1 tablet by mouth at bedtime. 06/23/14  Yes Domenic Polite, MD  nitroGLYCERIN (NITROSTAT) 0.4 MG SL tablet Place 1 tablet (0.4 mg total) under the tongue every 5 (five) minutes as needed for chest pain. 03/20/12  Yes Rhonda G Barrett, PA-C  pantoprazole (PROTONIX) 40 MG tablet Take 40 mg by mouth daily.   Yes Historical Provider, MD  rOPINIRole (REQUIP) 0.5 MG tablet Take 0.5 mg by mouth 3 (three) times daily.    Yes Historical Provider, MD  NOVOLOG 100 UNIT/ML injection 0.75 units per hour via insulin pump per pt - - USE AS DIRECTED (TRITRATE AS NEEDED) *MAX 50 UNITS A DAY* 08/28/14   Historical Provider, MD  traMADol (ULTRAM) 50 MG tablet Take 1 tablet (50 mg total) by mouth every 6 (six) hours as needed. 04/18/16   Daleen Bo, MD    Family History Family History  Problem Relation Age of Onset  . Heart attack Sister     died @ 59  . Cancer Mother     died @ 10; unknown type  . Diabetes Brother     deceased  . Cirrhosis Father     alcohol related  . Diabetes Father   . Esophageal cancer Neg Hx   . Colon cancer Neg Hx   . Pancreatic cancer Neg Hx   . Stomach cancer Neg Hx     Social History Social History  Substance Use Topics  . Smoking status: Former Smoker    Packs/day: 1.00    Years: 2.00    Types: Cigarettes  . Smokeless tobacco: Never Used     Comment: 06/16/2014 "quit smoking cigarettes in the early 1970's"  . Alcohol use No     Allergies   Kiwi extract and Tape   Review of Systems Review of Systems   Physical Exam Updated Vital Signs BP 193/90   Pulse 83   Temp 97.9 F (36.6 C) (Oral)   Resp (!) 27   Ht 5\' 11"  (1.803 m)   Wt 195 lb (88.5 kg)   SpO2 95%    BMI 27.20 kg/m   Physical Exam   ED Treatments / Results  Labs (all labs ordered are listed, but only abnormal results are displayed) Labs Reviewed  BASIC METABOLIC PANEL - Abnormal; Notable for the following:       Result Value   Chloride 98 (*)    BUN 40 (*)    Creatinine,  Ser 10.27 (*)    GFR calc non Af Amer 5 (*)    GFR calc Af Amer 5 (*)    All other components within normal limits  CBC - Abnormal; Notable for the following:    RBC 3.67 (*)    Hemoglobin 11.0 (*)    HCT 32.8 (*)    All other components within normal limits  I-STAT TROPOININ, ED    EKG  EKG Interpretation  Date/Time:  Tuesday April 18 2016 08:19:33 EST Ventricular Rate:  82 PR Interval:  130 QRS Duration: 94 QT Interval:  358 QTC Calculation: 418 R Axis:   90 Text Interpretation:  Normal sinus rhythm Rightward axis Septal infarct , age undetermined ST & T wave abnormality, consider inferolateral ischemia Abnormal ECG new inferolateral ST depressions  Confirmed by LIU MD, Hinton Dyer ([id number]) on 04/18/2016 8:28:00 AM Also confirmed by LIU MD, DANA [phone]), editor [place], Joelene Millin [phone])  on 04/18/2016 8:42:47 AM       Radiology Dg Chest 2 View  Result Date: 04/18/2016 CLINICAL DATA:  Shortness of breath for 2 days.  Wheezing. EXAM: CHEST  2 VIEW COMPARISON:  10/08/2015 FINDINGS: Atherosclerotic calcification of the aortic arch. Small central eventration of the right hemidiaphragm. Mild enlargement of the cardiopericardial silhouette, without edema. Mild blunting of the right posterior costophrenic angle which seems to be chronic. IMPRESSION: 1.  No active cardiopulmonary disease is radiographically apparent. 2. Chronic blunting of the right posterior costophrenic angle. 3. Mild enlargement of the cardiopericardial silhouette, without edema. 4. Atherosclerotic aortic arch. Electronically Signed   By: Van Clines M.D.   On: 04/18/2016 09:23    Procedures Procedures (including critical care  time)  Medications Ordered in ED Medications  traMADol (ULTRAM) tablet 50 mg (50 mg Oral Given 04/18/16 1316)     Initial Impression / Assessment and Plan / ED Course  I have reviewed the triage vital signs and the nursing notes.  Pertinent labs & imaging results that were available during my care of the patient were reviewed by me and considered in my medical decision making (see chart for details).  Clinical Course     Medications  traMADol (ULTRAM) tablet 50 mg (50 mg Oral Given 04/18/16 1316)    Patient Vitals for the past 24 hrs:  BP Temp Temp src Pulse Resp SpO2 Height Weight  04/18/16 1300 193/90 - - 83 (!) 27 95 % - -  04/18/16 1230 177/91 - - 80 23 97 % - -  04/18/16 1215 169/85 - - 74 20 92 % - -  04/18/16 1200 170/90 - - 73 21 94 % - -  04/18/16 1050 145/83 97.9 F (36.6 C) Oral 71 17 95 % - -  04/18/16 0827 - - - - - - 5\' 11"  (1.803 m) 195 lb (88.5 kg)  04/18/16 0826 158/86 98.4 F (36.9 C) Oral 83 16 97 % - -    1:25 PM Reevaluation with update and discussion. After initial assessment and treatment, an updated evaluation reveals no change in clinical status. Findings discussed with patient, and all questions answered.Daleen Bo L    Final Clinical Impressions(s) / ED Diagnoses   Final diagnoses:  Sciatica of right side  Nonspecific chest pain   Nonspecific chest pain, with reassuring evaluation. Patient with normal coronary arteries, and last catheterization. On associated right lower back pain, most likely to represent sciatica. Doubt ACS, PE or pneumonia.  Nursing Notes Reviewed/ Care Coordinated Applicable Imaging Reviewed Interpretation of Laboratory Data incorporated into  ED treatment  The patient appears reasonably screened and/or stabilized for discharge and I doubt any other medical condition or other [facility] requiring further screening, evaluation, or treatment in the ED at this time prior to discharge.  Plan: Home Medications- continue; Home  Treatments- rest; return here if the recommended treatment, does not improve the symptoms; Recommended follow up- PCP prn   New Prescriptions New Prescriptions   TRAMADOL (ULTRAM) 50 MG TABLET    Take 1 tablet (50 mg total) by mouth every 6 (six) hours as needed.     Daleen Bo, MD 04/18/16 1326

## 2016-04-18 NOTE — Discharge Instructions (Signed)
Use heat on the sore area 3 or 4 times a day.  Use Tylenol or the narcotic pain reliever, as needed for pain.

## 2016-05-16 ENCOUNTER — Ambulatory Visit: Payer: BC Managed Care – PPO | Attending: Nephrology | Admitting: Physical Therapy

## 2016-05-16 DIAGNOSIS — G8929 Other chronic pain: Secondary | ICD-10-CM | POA: Diagnosis present

## 2016-05-16 DIAGNOSIS — M6281 Muscle weakness (generalized): Secondary | ICD-10-CM | POA: Diagnosis present

## 2016-05-16 DIAGNOSIS — M545 Low back pain, unspecified: Secondary | ICD-10-CM

## 2016-05-16 DIAGNOSIS — M62830 Muscle spasm of back: Secondary | ICD-10-CM

## 2016-05-16 DIAGNOSIS — R293 Abnormal posture: Secondary | ICD-10-CM | POA: Diagnosis present

## 2016-05-16 NOTE — Therapy (Signed)
[place], [place], [address] Phone: [phone]   Fax:  [phone]  Physical Therapy Evaluation  Patient Details  Name: JAQUON GINGERICH MRN: [id number] Date of Birth: 02-06-1951 Referring Provider: Mauricia Area MD  Encounter Date: 05/16/2016      PT End of Session - 05/16/16 1014    Visit Number 1   Number of Visits 6   Date for PT Re-Evaluation 07/04/16   Authorization Type Medicare: Kx mod by 15th visit, progress note by 10th visit   PT Start Time 0931   PT Stop Time 1014   PT Time Calculation (min) 43 min   Activity Tolerance Patient tolerated treatment well   Behavior During Therapy [facility] for tasks assessed/performed      Past Medical History:  Diagnosis Date  . Anemia of chronic disease   . CAD (coronary artery disease)    a.  Myoview 4/11: EF 53%, no scar or ischemia   c. MV 2012 Nl perfusion, apical thinning.  No ischemia or scar.  EF 49%, appears greater by visual estimate.;  d.  Dob stress echo 12/13:  Negative Dob stress echo. There is no evidence of ischemia.  The LVF is normal. b. Normal cors 2016.  . Carotid stenosis    a. <78% RICA, >93% LICA by duplex 11/1015  . Chronic chest pain   . ESRD (end stage renal disease) on dialysis ([place])   . GERD (gastroesophageal reflux disease)   . HTN (hypertension)    echo 3/10: EF 60%, LAE  . Hyperlipidemia   . Nephrolithiasis    "passed them all"  . Peripheral arterial disease ([place])    a. s/p PTCA to L SFA.  Marland Kitchen Pneumonia   . Snores    a. presumed OSA, pt has refused sleep eval in past.  . Type II diabetes mellitus ([place])     Past Surgical History:  Procedure Laterality Date  . ANGIOPLASTY / STENTING FEMORAL Left 12/11/2013   dr berry  . AV FISTULA PLACEMENT Left 03/19/2014   Procedure: CREATION OF ARTERIOVENOUS (AV) FISTULA  LEFT UPPER ARM;  Surgeon: Mal Misty, MD;  Location: [place];  Service: Vascular;  Laterality: Left;  . CARDIAC  CATHETERIZATION  2001 and 2010   . COLONOSCOPY W/ BIOPSIES AND POLYPECTOMY    . FOOT FRACTURE SURGERY Right   . FRACTURE SURGERY    . INGUINAL HERNIA REPAIR Left   . LEFT HEART CATHETERIZATION WITH CORONARY ANGIOGRAM N/A 06/22/2014   Procedure: LEFT HEART CATHETERIZATION WITH CORONARY ANGIOGRAM;  Surgeon: Troy Sine, MD;  Location: [facility] CATH LAB;  Service: Cardiovascular;  Laterality: N/A;  . LOWER EXTREMITY ANGIOGRAM Left 12/11/2013   Procedure: LOWER EXTREMITY ANGIOGRAM;  Surgeon: Lorretta Harp, MD;  Location: [facility] CATH LAB;  Service: Cardiovascular;  Laterality: Left;  . TONSILLECTOMY AND ADENOIDECTOMY      There were no vitals filed for this visit.       Subjective Assessment - 05/16/16 0935    Subjective pt is a 66 y.o M with CC of R sided low back pain that started following him picking up his grandchild that occurred 3 weeks ago. pain radiated to the back of the R hip and into the lateral aspect of the hip, that is worse with straing or using the restroom.  pt denies N/T in tot he LE just pain. Since onset the pain has stayed the same.    Pertinent History end stage Kidney disease.  Limitations Sitting;Lifting;Walking;Standing;House hold activities   How long can you sit comfortably? 4 hours ( dialysis)   How long can you stand comfortably? 30 min   How long can you walk comfortably? 30 min   Diagnostic tests N/A   Patient Stated Goals to walk more normally, calm the pain, to be able to do normal activities without difficulty, getting in/out of the car   Currently in Pain? Yes   Pain Score 5    Pain Location Back   Pain Orientation Lower;Right   Pain Descriptors / Indicators Aching   Pain Type --  sub-acute   Pain Onset 1 to 4 weeks ago   Pain Frequency Constant   Aggravating Factors  using the restroom, getting in/out of chair, prolonged standing, sitting or walking,    Pain Relieving Factors Medication            [facility] PT Assessment - 05/16/16 0930       Assessment   Medical Diagnosis Low back pain   Referring Provider Mauricia Area MD   Onset Date/Surgical Date --  3 weeks   Hand Dominance Right   Next MD Visit 05/17/2016   Prior Therapy no     Precautions   Precautions None     Restrictions   Weight Bearing Restrictions No     Balance Screen   Has the patient fallen in the past 6 months No   Has the patient had a decrease in activity level because of a fear of falling?  No   Is the patient reluctant to leave their home because of a fear of falling?  No     Home Social worker Private residence   Living Arrangements Spouse/significant other;Children   Available Help at Discharge Available PRN/intermittently   Type of [place] to enter   Entrance Stairs-Number of Steps 3   [place] One level   [place] - 2 wheels     Prior Function   Level of Independence Independent;Independent with basic ADLs   Vocation Retired     Charity fundraiser Status Within Functional Limits for tasks assessed     Observation/Other Assessments   Focus on Therapeutic Outcomes (FOTO)  53% limited  predicted 37% limited     Posture/Postural Control   Posture/Postural Control Postural limitations   Postural Limitations Forward head;Rounded Shoulders;Increased thoracic kyphosis     ROM / Strength   AROM / PROM / Strength AROM;PROM;Strength     AROM   AROM Assessment Site Lumbar   Lumbar Flexion 40  ERP   Lumbar Extension 20   Lumbar - Right Side Bend 28  end range pain   Lumbar - Left Side Bend 26  end range pain     Strength   Strength Assessment Site Hip;Knee   Right/Left Hip Right;Left   Right Hip Flexion 4+/5   Right Hip Extension 3-/5   Right Hip ABduction 3+/5   Right Hip ADduction 4+/5   Left Hip Flexion 5/5   Left Hip Extension 3-/5   Left Hip ABduction 3+/5   Left Hip ADduction 4+/5   Right/Left Knee Right;Left   Right  Knee Flexion 5/5   Right Knee Extension 5/5   Left Knee Flexion 5/5   Left Knee Extension 5/5     Palpation   Spinal mobility hypomobility at the L1-L5 PA PAIVM with pain .    Palpation comment tenderness  at the piriformis/ glute region on the R. spasm of the  R thoracolumbar paraspinals.      Special Tests    Special Tests Lumbar   Lumbar Tests Prone Knee Bend Test     Prone Knee Bend Test   Findings Positive   Comment bil                           PT Education - 05/16/16 1014    Education provided Yes   Education Details evaluation findings, POC, goals, HEP with proper form/ rationale. Anatomy of the low back.   Person(s) Educated Patient   Methods Explanation;Verbal cues;Handout;Demonstration   Comprehension Verbalized understanding;Verbal cues required;Returned demonstration          PT Short Term Goals - 05/16/16 1124      PT SHORT TERM GOAL #1   Title pt will be I with inital HEP (06/06/2016)   Time 3   Period Weeks   Status New     PT SHORT TERM GOAL #2   Title He will be able to verbalize and demo proper posture and lifting/ carrying mechanics to prevent and reduce low back pain (06/06/2016)   Time 3   Period Weeks   Status New           PT Long Term Goals - 05/16/16 1125      PT LONG TERM GOAL #1   Title He will be I with all HEP given as of last visit (07/04/16)   Time 6   Period Weeks   Status New     PT LONG TERM GOAL #2   Title pt will increase trunk mobility by >/= 10 degrees for flexion, and 5 >/= 5 degrees for extension and bil side bending for ADLS with </= 1/97 pain (07/04/16)   Time 6   Period Weeks   Status New     PT LONG TERM GOAL #3   Title He will be able to lift and carrying >/= 25# utilizing proper posture and mechanics with </= 2/10 pain assist with lifting and carrying his grandchild (07/04/2016)   Time 6   Period Weeks   Status New     PT LONG TERM GOAL #4   Title pt will increase his FOTO score to </=  37% limited to demonstrate improvement in function at discharged (07/04/2016)   Time 6   Period Weeks   Status New               Plan - 05/16/16 1103    Clinical Impression Statement Mr. Brau presents to OPPT as a moderate complexity evaluation due to involved PMHx, constant pain/symptomology and exam findings regarding CC of low back pain. he demonstrates limited trunk mobility secondary to pain and tightness. mild weakness noted in the bil LE. palpaiton revealed significant tightness in the R thoracolumbar paraspinals with hypomobile lumbar vertebrae. He would benefit from physical therapy to improve trunk mobility, reduce muscle spasm and pain and maximize his function by addressing the deficits listed below.     Rehab Potential Good   PT Frequency 1x / week   PT Duration 8 weeks   PT Treatment/Interventions ADLs/Self Care Home Management;Electrical Stimulation;Iontophoresis 4mg /ml Dexamethasone;Cryotherapy;Dry needling;Taping;Passive range of motion;Patient/family education;Therapeutic activities;Therapeutic exercise;Moist Heat;Manual techniques;Ultrasound;Balance training   PT Next Visit Plan assess/ review HEP, Soft tissue work over paraspinals, core strengthening/ hip strengtheing, stretching of the low back   PT Home Exercise Plan supine marching, piriformis/ hip  flexor stretching, lower trunk rotation   Consulted and Agree with Plan of Care Patient      Patient will benefit from skilled therapeutic intervention in order to improve the following deficits and impairments:  Pain, Decreased endurance, Decreased activity tolerance, Decreased strength, Improper body mechanics, Postural dysfunction, Decreased range of motion  Visit Diagnosis: Chronic right-sided low back pain without sciatica - Plan: PT plan of care cert/re-cert  Muscle spasm of back - Plan: PT plan of care cert/re-cert  Abnormal posture - Plan: PT plan of care cert/re-cert  Muscle weakness (generalized) -  Plan: PT plan of care cert/re-cert      G-Codes - 00/34/91 1139    Functional Limitation Changing and maintaining body position   Changing and Maintaining Body Position Current Status (P9150) At least 40 percent but less than 60 percent impaired, limited or restricted   Changing and Maintaining Body Position Goal Status (V6979) At least 20 percent but less than 40 percent impaired, limited or restricted       Problem List Patient Active Problem List   Diagnosis Date Noted  . Chest pain, non-cardiac 10/09/2015  . Acute on chronic renal failure ([place]) 06/16/2014  . Renal failure (ARF), acute on chronic (HCC) 06/16/2014  . Shoulder pain, left 12/15/2013  . Chest pain 12/15/2013  . Claudication ([place]) 12/11/2013  . PVD (peripheral vascular disease) ([place]) 12/11/2013  . Peripheral arterial disease ([place]) 09/30/2013  . Carotid artery disease ([place]) 09/30/2013  . Acute chest pain 11/15/2012  . Left-sided chest wall pain 03/19/2012  . Bruit 09/15/2010  . CAD (coronary artery disease) nonobstructive per cath 2012   . Chronic kidney disease (CKD), stage IV (severe) ([place])   . CHEST PAIN, PRECORDIAL 07/26/2009  . DM (diabetes mellitus) ([place]) 07/22/2009  . Hyperlipidemia LDL goal <70 07/22/2009  . Hypertension 07/22/2009   Starr Lake PT, DPT, LAT, ATC  05/16/16  11:42 AM      [facility] [address] [place], [place], [address] Phone: [phone]   Fax:  [phone]  Name: CALBERT HULSEBUS MRN: [id number] Date of Birth: [birth year]

## 2016-05-23 ENCOUNTER — Ambulatory Visit: Payer: BC Managed Care – PPO | Admitting: Physical Therapy

## 2016-05-23 ENCOUNTER — Ambulatory Visit (INDEPENDENT_AMBULATORY_CARE_PROVIDER_SITE_OTHER): Payer: BC Managed Care – PPO | Admitting: Podiatry

## 2016-05-23 ENCOUNTER — Encounter: Payer: Self-pay | Admitting: Podiatry

## 2016-05-23 DIAGNOSIS — E1142 Type 2 diabetes mellitus with diabetic polyneuropathy: Secondary | ICD-10-CM | POA: Diagnosis not present

## 2016-05-23 DIAGNOSIS — M62830 Muscle spasm of back: Secondary | ICD-10-CM

## 2016-05-23 DIAGNOSIS — Q828 Other specified congenital malformations of skin: Secondary | ICD-10-CM | POA: Diagnosis not present

## 2016-05-23 DIAGNOSIS — R293 Abnormal posture: Secondary | ICD-10-CM

## 2016-05-23 DIAGNOSIS — M79676 Pain in unspecified toe(s): Secondary | ICD-10-CM

## 2016-05-23 DIAGNOSIS — B351 Tinea unguium: Secondary | ICD-10-CM

## 2016-05-23 DIAGNOSIS — M545 Low back pain: Principal | ICD-10-CM

## 2016-05-23 DIAGNOSIS — G8929 Other chronic pain: Secondary | ICD-10-CM

## 2016-05-23 DIAGNOSIS — M6281 Muscle weakness (generalized): Secondary | ICD-10-CM

## 2016-05-23 NOTE — Patient Instructions (Signed)

## 2016-05-23 NOTE — Therapy (Signed)
[place], [place], [address] Phone: [phone]   Fax:  [phone]  Physical Therapy Treatment  Patient Details  Name: COURT GRACIA MRN: [id number] Date of Birth: [birth year] Referring Provider: Mauricia Area MD  Encounter Date: 05/23/2016      PT End of Session - 05/23/16 0851    Visit Number 2   Number of Visits 6   Date for PT Re-Evaluation 07/04/16   PT Start Time 0812   PT Stop Time 0851   PT Time Calculation (min) 39 min   Activity Tolerance Patient tolerated treatment well;Treatment limited secondary to medical complications (Comment)      Past Medical History:  Diagnosis Date  . Anemia of chronic disease   . CAD (coronary artery disease)    a.  Myoview 4/11: EF 53%, no scar or ischemia   c. MV 2012 Nl perfusion, apical thinning.  No ischemia or scar.  EF 49%, appears greater by visual estimate.;  d.  Dob stress echo 12/13:  Negative Dob stress echo. There is no evidence of ischemia.  The LVF is normal. b. Normal cors 2016.  . Carotid stenosis    a. <36% RICA, >14% LICA by duplex 07/3152  . Chronic chest pain   . ESRD (end stage renal disease) on dialysis ([place])   . GERD (gastroesophageal reflux disease)   . HTN (hypertension)    echo 3/10: EF 60%, LAE  . Hyperlipidemia   . Nephrolithiasis    "passed them all"  . Peripheral arterial disease ([place])    a. s/p PTCA to L SFA.  Marland Kitchen Pneumonia   . Snores    a. presumed OSA, pt has refused sleep eval in past.  . Type II diabetes mellitus ([place])     Past Surgical History:  Procedure Laterality Date  . ANGIOPLASTY / STENTING FEMORAL Left 12/11/2013   dr berry  . AV FISTULA PLACEMENT Left 03/19/2014   Procedure: CREATION OF ARTERIOVENOUS (AV) FISTULA  LEFT UPPER ARM;  Surgeon: Mal Misty, MD;  Location: [place];  Service: Vascular;  Laterality: Left;  . CARDIAC CATHETERIZATION  2001 and 2010   . COLONOSCOPY W/ BIOPSIES AND POLYPECTOMY     . FOOT FRACTURE SURGERY Right   . FRACTURE SURGERY    . INGUINAL HERNIA REPAIR Left   . LEFT HEART CATHETERIZATION WITH CORONARY ANGIOGRAM N/A 06/22/2014   Procedure: LEFT HEART CATHETERIZATION WITH CORONARY ANGIOGRAM;  Surgeon: Troy Sine, MD;  Location[facility] CATH LAB;  Service: Cardiovascular;  Laterality: N/A;  . LOWER EXTREMITY ANGIOGRAM Left 12/11/2013   Procedure: LOWER EXTREMITY ANGIOGRAM;  Surgeon: Lorretta Harp, MD;  Location[facility] CATH LAB;  Service: Cardiovascular;  Laterality: Left;  . TONSILLECTOMY AND ADENOIDECTOMY      There were no vitals filed for this visit.      Subjective Assessment - 05/23/16 0816    Subjective "Still hurting. I have been doing some of my exercises, but I still hurt after I finish them."   Currently in Pain? Yes   Pain Score 6    Pain Location Back   Pain Orientation Right;Lower   Pain Descriptors / Indicators Aching;Sharp   Aggravating Factors  using the restroom, getting into/out of chair, prolonged standing, sitting, or walking   Pain Relieving Factors medication                         [facility] Adult PT Treatment/Exercise - 05/23/16 0001  Lumbar Exercises: Aerobic   Stationary Bike nustep L5 x 6 min     Knee/Hip Exercises: Stretches   Passive Hamstring Stretch Right;2 reps;30 seconds   Hip Flexor Stretch Right;3 reps;30 seconds   Piriformis Stretch 2 reps;30 seconds     Knee/Hip Exercises: Seated   Hamstring Curl Right;20 reps  yellow band     Manual Therapy   Manual Therapy Myofascial release;Soft tissue mobilization;Joint mobilization   Joint Mobilization central and R unilateral PA PAIVM L1-5   Myofascial Release manual trigger point release over R piriformis using tennis ball                PT Education - 05/23/16 0945    Education provided Yes   Education Details importance of doing HEP especially stretching, posture handout   Person(s) Educated Patient   Methods Explanation;Verbal  cues;Handout   Comprehension Verbalized understanding;Verbal cues required          PT Short Term Goals - 05/16/16 1124      PT SHORT TERM GOAL #1   Title pt will be I with inital HEP (06/06/2016)   Time 3   Period Weeks   Status New     PT SHORT TERM GOAL #2   Title He will be able to verbalize and demo proper posture and lifting/ carrying mechanics to prevent and reduce low back pain (06/06/2016)   Time 3   Period Weeks   Status New           PT Long Term Goals - 05/16/16 1125      PT LONG TERM GOAL #1   Title He will be I with all HEP given as of last visit (07/04/16)   Time 6   Period Weeks   Status New     PT LONG TERM GOAL #2   Title pt will increase trunk mobility by >/= 10 degrees for flexion, and 5 >/= 5 degrees for extension and bil side bending for ADLS with </= 6/16 pain (07/04/16)   Time 6   Period Weeks   Status New     PT LONG TERM GOAL #3   Title He will be able to lift and carrying >/= 25# utilizing proper posture and mechanics with </= 2/10 pain assist with lifting and carrying his grandchild (07/04/2016)   Time 6   Period Weeks   Status New     PT LONG TERM GOAL #4   Title pt will increase his FOTO score to </= 37% limited to demonstrate improvement in function at discharged (07/04/2016)   Time 6   Period Weeks   Status New               Plan - 05/23/16 0737    Clinical Impression Statement pt arrived 12 minutes late. Mr. Hinshaw reports that his pain has not improved and that he has been doing some of his HEP. Focused on manual therapy to try and calm down his pain. Sacral testing indicates possible SIJ involvement. Reported some relief after hip flexor strengthening and hamstring activation. Declined modalities post session.    PT Next Visit Plan assess/ review HEP, hip flexor stretching, hamstring activation, core strengthening/ hip strengthening, stretching of the low back   PT Home Exercise Plan supine marching, piriformis/ hip  flexor stretching, lower trunk rotation   Consulted and Agree with Plan of Care Patient      Patient will benefit from skilled therapeutic intervention in order to improve the following deficits and impairments:  Pain, Decreased endurance, Decreased activity tolerance, Decreased strength, Improper body mechanics, Postural dysfunction, Decreased range of motion  Visit Diagnosis: Chronic right-sided low back pain without sciatica  Muscle spasm of back  Abnormal posture  Muscle weakness (generalized)     Problem List Patient Active Problem List   Diagnosis Date Noted  . Chest pain, non-cardiac 10/09/2015  . Acute on chronic renal failure ([place]) 06/16/2014  . Renal failure (ARF), acute on chronic (HCC) 06/16/2014  . Shoulder pain, left 12/15/2013  . Chest pain 12/15/2013  . Claudication ([place]) 12/11/2013  . PVD (peripheral vascular disease) ([place]) 12/11/2013  . Peripheral arterial disease ([place]) 09/30/2013  . Carotid artery disease ([place]) 09/30/2013  . Acute chest pain 11/15/2012  . Left-sided chest wall pain 03/19/2012  . Bruit 09/15/2010  . CAD (coronary artery disease) nonobstructive per cath 2012   . Chronic kidney disease (CKD), stage IV (severe) ([place])   . CHEST PAIN, PRECORDIAL 07/26/2009  . DM (diabetes mellitus) ([place]) 07/22/2009  . Hyperlipidemia LDL goal <70 07/22/2009  . Hypertension 07/22/2009    Alda Ponder, SPT 05/23/2016, 10:03 AM  [place], [place], [address] Phone: [phone]   Fax:  [phone]  Name: JEROME VIGLIONE MRN: [id number] Date of Birth: 1950/11/16

## 2016-05-23 NOTE — Progress Notes (Signed)
He presents today with chief complaint of painful elongated toenails with corns and calluses.  Objective: Vital signs are stable he is alert and oriented 3 pulses are palpable. Toenails are thick yellow dystrophic with mycotic multiple porokeratosis plantar aspect bilateral foot. No open lesions or wounds.  Assessment: Pain in limb segment onychomycosis porokeratosis. Diabetes mellitus without complication  Plan: Debridement of toenails 1 through 5 bilateral debridement of calluses bilaterally.

## 2016-05-30 ENCOUNTER — Ambulatory Visit: Payer: BC Managed Care – PPO

## 2016-05-30 DIAGNOSIS — M6281 Muscle weakness (generalized): Secondary | ICD-10-CM

## 2016-05-30 DIAGNOSIS — G8929 Other chronic pain: Secondary | ICD-10-CM

## 2016-05-30 DIAGNOSIS — M545 Low back pain: Principal | ICD-10-CM

## 2016-05-30 DIAGNOSIS — R293 Abnormal posture: Secondary | ICD-10-CM

## 2016-05-30 DIAGNOSIS — M62830 Muscle spasm of back: Secondary | ICD-10-CM

## 2016-05-30 NOTE — Therapy (Signed)
[place], [place], [address] Phone: [phone]   Fax:  [phone]  Physical Therapy Treatment  Patient Details  Name: Jon Anderson MRN: [id number] Date of Birth: 1950/10/25 Referring Provider: Mauricia Area MD  Encounter Date: 05/30/2016      PT End of Session - 05/30/16 0834    Visit Number 3   Number of Visits 6   Date for PT Re-Evaluation 07/04/16   Authorization Type Medicare: Kx mod by 15th visit, progress note by 10th visit   PT Start Time 0845   PT Stop Time 0945   PT Time Calculation (min) 60 min   Activity Tolerance Patient tolerated treatment well;Treatment limited secondary to medical complications (Comment)   Behavior During Therapy [facility] for tasks assessed/performed      Past Medical History:  Diagnosis Date  . Anemia of chronic disease   . CAD (coronary artery disease)    a.  Myoview 4/11: EF 53%, no scar or ischemia   c. MV 2012 Nl perfusion, apical thinning.  No ischemia or scar.  EF 49%, appears greater by visual estimate.;  d.  Dob stress echo 12/13:  Negative Dob stress echo. There is no evidence of ischemia.  The LVF is normal. b. Normal cors 2016.  . Carotid stenosis    a. <77% RICA, >41% LICA by duplex 05/8784  . Chronic chest pain   . ESRD (end stage renal disease) on dialysis ([place])   . GERD (gastroesophageal reflux disease)   . HTN (hypertension)    echo 3/10: EF 60%, LAE  . Hyperlipidemia   . Nephrolithiasis    "passed them all"  . Peripheral arterial disease ([place])    a. s/p PTCA to L SFA.  Marland Kitchen Pneumonia   . Snores    a. presumed OSA, pt has refused sleep eval in past.  . Type II diabetes mellitus ([place])     Past Surgical History:  Procedure Laterality Date  . ANGIOPLASTY / STENTING FEMORAL Left 12/11/2013   dr berry  . AV FISTULA PLACEMENT Left 03/19/2014   Procedure: CREATION OF ARTERIOVENOUS (AV) FISTULA  LEFT UPPER ARM;  Surgeon: Mal Misty, MD;   Location: [place];  Service: Vascular;  Laterality: Left;  . CARDIAC CATHETERIZATION  2001 and 2010   . COLONOSCOPY W/ BIOPSIES AND POLYPECTOMY    . FOOT FRACTURE SURGERY Right   . FRACTURE SURGERY    . INGUINAL HERNIA REPAIR Left   . LEFT HEART CATHETERIZATION WITH CORONARY ANGIOGRAM N/A 06/22/2014   Procedure: LEFT HEART CATHETERIZATION WITH CORONARY ANGIOGRAM;  Surgeon: [place] Sine, MD;  Location[facility] LAB;  Service: Cardiovascular;  Laterality: N/A;  . LOWER EXTREMITY ANGIOGRAM Left 12/11/2013   Procedure: LOWER EXTREMITY ANGIOGRAM;  Surgeon: Lorretta Harp, MD;  Location: [facility] CATH LAB;  Service: Cardiovascular;  Laterality: Left;  . TONSILLECTOMY AND ADENOIDECTOMY      There were no vitals filed for this visit.      Subjective Assessment - 05/30/16 0851    Subjective No better . Reports doing HEP   Currently in Pain? Yes   Pain Score 5    Pain Location Back   Pain Orientation Right;Lower   Pain Descriptors / Indicators Aching;Sharp   Pain Type --  subacute   Pain Onset More than a month ago   Pain Frequency Constant   Aggravating Factors  lying in bed at night   Pain Relieving Factors medication   Multiple Pain Sites  No                         [facility] Adult PT Treatment/Exercise - 05/30/16 0001      Lumbar Exercises: Stretches   Piriformis Stretch 2 reps  45 sec     Lumbar Exercises: Aerobic   Stationary Bike nustep L6 LE  x 6 min     Lumbar Exercises: Supine   Glut Set 10 reps   Glut Set Limitations 2 sets  short hip lift     Knee/Hip Exercises: Stretches   Hip Flexor Stretch Right;2 reps;30 seconds   Hip Flexor Stretch Limitations contract relax with quad     Modalities   Modalities Moist Heat     Moist Heat Therapy   Number Minutes Moist Heat 15 Minutes   Moist Heat Location Lumbar Spine;Hip  supine     Manual Therapy   Manual Therapy Muscle Energy Technique   Manual therapy comments RT hip IR /ER abduction and extension stretching  and with fascial release of piriformis and glutes.    Joint Mobilization central and R unilateral PA PAIVM L1-5   Myofascial Release manual trigger point release over R piriformis using tennis ball   Muscle Energy Technique For posterior R ilia                  PT Short Term Goals - 05/30/16 1132      PT SHORT TERM GOAL #1   Title pt will be I with inital HEP (06/06/2016)   Status Partially Met     PT SHORT TERM GOAL #2   Title He will be able to verbalize and demo proper posture and lifting/ carrying mechanics to prevent and reduce low back pain (06/06/2016)   Status On-going           PT Long Term Goals - 05/16/16 1125      PT LONG TERM GOAL #1   Title He will be I with all HEP given as of last visit (07/04/16)   Time 6   Period Weeks   Status New     PT LONG TERM GOAL #2   Title pt will increase trunk mobility by >/= 10 degrees for flexion, and 5 >/= 5 degrees for extension and bil side bending for ADLS with </= 1/74 pain (07/04/16)   Time 6   Period Weeks   Status New     PT LONG TERM GOAL #3   Title He will be able to lift and carrying >/= 25# utilizing proper posture and mechanics with </= 2/10 pain assist with lifting and carrying his grandchild (07/04/2016)   Time 6   Period Weeks   Status New     PT LONG TERM GOAL #4   Title pt will increase his FOTO score to </= 37% limited to demonstrate improvement in function at discharged (07/04/2016)   Time 6   Period Weeks   Status New               Plan - 05/30/16 0814    Clinical Impression Statement Arrived early today. Pain no better Worse at night.  No better after manual a stretching , but bette after MHP today post.  Extension of back seemed to decr pain more than flexion seem to make pain  worse.    PT Treatment/Interventions ADLs/Self Care Home Management;Electrical Stimulation;Iontophoresis 72m/ml Dexamethasone;Cryotherapy;Dry needling;Taping;Passive range of motion;Patient/family  education;Therapeutic activities;Therapeutic exercise;Moist Heat;Manual techniques;Ultrasound;Balance training   PT Next  Visit Plan assess/ review HEP, hip flexor stretching, hamstring activation, core strengthening/ hip strengthening, stretching of the low back, manual   PT Home Exercise Plan supine marching, piriformis/ hip flexor stretching, lower trunk rotation   Consulted and Agree with Plan of Care Patient      Patient will benefit from skilled therapeutic intervention in order to improve the following deficits and impairments:  Pain, Decreased endurance, Decreased activity tolerance, Decreased strength, Improper body mechanics, Postural dysfunction, Decreased range of motion  Visit Diagnosis: Chronic right-sided low back pain without sciatica  Muscle spasm of back  Abnormal posture  Muscle weakness (generalized)     Problem List Patient Active Problem List   Diagnosis Date Noted  . Chest pain, non-cardiac 10/09/2015  . Acute on chronic renal failure ([place]) 06/16/2014  . Renal failure (ARF), acute on chronic (HCC) 06/16/2014  . Shoulder pain, left 12/15/2013  . Chest pain 12/15/2013  . Claudication ([place]) 12/11/2013  . PVD (peripheral vascular disease) ([place]) 12/11/2013  . Peripheral arterial disease ([place]) 09/30/2013  . Carotid artery disease ([place]) 09/30/2013  . Acute chest pain 11/15/2012  . Left-sided chest wall pain 03/19/2012  . Bruit 09/15/2010  . CAD (coronary artery disease) nonobstructive per cath 2012   . Chronic kidney disease (CKD), stage IV (severe) ([place])   . CHEST PAIN, PRECORDIAL 07/26/2009  . DM (diabetes mellitus) ([place]) 07/22/2009  . Hyperlipidemia LDL goal <70 07/22/2009  . Hypertension 07/22/2009    Darrel Hoover  PT 05/30/2016, 11:36 AM  [facility] [address] [place], [place], [address] Phone: [phone]   Fax:  [phone]  Name: Jon Anderson MRN: [id number] Date of Birth:  Nov 07, 1950

## 2016-06-02 ENCOUNTER — Ambulatory Visit
Admission: RE | Admit: 2016-06-02 | Discharge: 2016-06-02 | Disposition: A | Discharge status: Home / Self Care | Payer: BC Managed Care – PPO | Source: Ambulatory Visit | Attending: Internal Medicine | Admitting: Internal Medicine

## 2016-06-02 ENCOUNTER — Other Ambulatory Visit: Payer: Self-pay | Admitting: Internal Medicine

## 2016-06-02 DIAGNOSIS — M5431 Sciatica, right side: Secondary | ICD-10-CM

## 2016-06-06 ENCOUNTER — Ambulatory Visit: Payer: BC Managed Care – PPO | Admitting: Physical Therapy

## 2016-06-06 DIAGNOSIS — G8929 Other chronic pain: Secondary | ICD-10-CM

## 2016-06-06 DIAGNOSIS — R293 Abnormal posture: Secondary | ICD-10-CM

## 2016-06-06 DIAGNOSIS — M545 Low back pain: Secondary | ICD-10-CM

## 2016-06-06 DIAGNOSIS — M6281 Muscle weakness (generalized): Secondary | ICD-10-CM

## 2016-06-06 DIAGNOSIS — M62830 Muscle spasm of back: Secondary | ICD-10-CM

## 2016-06-06 NOTE — Therapy (Signed)
[place], [place], [address] Phone: [phone]   Fax:  [phone]  Physical Therapy Treatment  Patient Details  Name: Jon Anderson MRN: [id number] Date of Birth: 1950-05-14 Referring Provider: Mauricia Area MD  Encounter Date: 06/06/2016      PT End of Session - 06/06/16 0942    Visit Number 4   Number of Visits 6   Date for PT Re-Evaluation 07/04/16   PT Start Time 0936   PT Stop Time 1025   PT Time Calculation (min) 49 min   Activity Tolerance Patient tolerated treatment well;Treatment limited secondary to medical complications (Comment)   Behavior During Therapy [facility] for tasks assessed/performed      Past Medical History:  Diagnosis Date  . Anemia of chronic disease   . CAD (coronary artery disease)    a.  Myoview 4/11: EF 53%, no scar or ischemia   c. MV 2012 Nl perfusion, apical thinning.  No ischemia or scar.  EF 49%, appears greater by visual estimate.;  d.  Dob stress echo 12/13:  Negative Dob stress echo. There is no evidence of ischemia.  The LVF is normal. b. Normal cors 2016.  . Carotid stenosis    a. <67% RICA, >34% LICA by duplex 04/9377  . Chronic chest pain   . ESRD (end stage renal disease) on dialysis ([place])   . GERD (gastroesophageal reflux disease)   . HTN (hypertension)    echo 3/10: EF 60%, LAE  . Hyperlipidemia   . Nephrolithiasis    "passed them all"  . Peripheral arterial disease ([place])    a. s/p PTCA to L SFA.  Marland Kitchen Pneumonia   . Snores    a. presumed OSA, pt has refused sleep eval in past.  . Type II diabetes mellitus ([place])     Past Surgical History:  Procedure Laterality Date  . ANGIOPLASTY / STENTING FEMORAL Left 12/11/2013   dr berry  . AV FISTULA PLACEMENT Left 03/19/2014   Procedure: CREATION OF ARTERIOVENOUS (AV) FISTULA  LEFT UPPER ARM;  Surgeon: Mal Misty, MD;  Location: [place];  Service: Vascular;  Laterality: Left;  . CARDIAC CATHETERIZATION   2001 and 2010   . COLONOSCOPY W/ BIOPSIES AND POLYPECTOMY    . FOOT FRACTURE SURGERY Right   . FRACTURE SURGERY    . INGUINAL HERNIA REPAIR Left   . LEFT HEART CATHETERIZATION WITH CORONARY ANGIOGRAM N/A 06/22/2014   Procedure: LEFT HEART CATHETERIZATION WITH CORONARY ANGIOGRAM;  Surgeon: [place] Sine, MD;  Location: [facility] CATH LAB;  Service: Cardiovascular;  Laterality: N/A;  . LOWER EXTREMITY ANGIOGRAM Left 12/11/2013   Procedure: LOWER EXTREMITY ANGIOGRAM;  Surgeon: Lorretta Harp, MD;  Location: [facility] CATH LAB;  Service: Cardiovascular;  Laterality: Left;  . TONSILLECTOMY AND ADENOIDECTOMY      There were no vitals filed for this visit.      Subjective Assessment - 06/06/16 0937    Subjective "The back is doing fairly good today at the moment, The MD prescribed some prednisone and I think it could be helping my back"    Currently in Pain? Yes   Pain Score 4    Pain Location Back   Pain Orientation Right   Pain Type Chronic pain   Pain Onset More than a month ago   Aggravating Factors  prolonged sitting,    Pain Relieving Factors medication.  [name] Adult PT Treatment/Exercise - 06/06/16 0001      Lumbar Exercises: Stretches   Lower Trunk Rotation 5 reps;30 seconds     Lumbar Exercises: Aerobic   Stationary Bike nustep L6  x 6 min  UE/LE     Knee/Hip Exercises: Stretches   Passive Hamstring Stretch --   Hip Flexor Stretch 2 reps;30 seconds     Knee/Hip Exercises: Supine   Bridges Limitations 2 x 10   with ball between the knees   Other Supine Knee/Hip Exercises Clams 2 x 10 with green theraband     Moist Heat Therapy   Number Minutes Moist Heat 10 Minutes   Moist Heat Location Lumbar Spine;Hip  with pt in prone, with cues to lay flat to stretch R hip     Manual Therapy   Manual Therapy Soft tissue mobilization   Joint Mobilization central and R unilateral PA PAIVM L1-5   Soft tissue mobilization IASTM over the R rectus  femoris while on stretch   Myofascial Release DTM over the R lumbar paraspinals                  PT Short Term Goals - 05/30/16 1132      PT SHORT TERM GOAL #1   Title pt will be I with inital HEP (06/06/2016)   Status Partially Met     PT SHORT TERM GOAL #2   Title He will be able to verbalize and demo proper posture and lifting/ carrying mechanics to prevent and reduce low back pain (06/06/2016)   Status On-going           PT Long Term Goals - 05/16/16 1125      PT LONG TERM GOAL #1   Title He will be I with all HEP given as of last visit (07/04/16)   Time 6   Period Weeks   Status New     PT LONG TERM GOAL #2   Title pt will increase trunk mobility by >/= 10 degrees for flexion, and 5 >/= 5 degrees for extension and bil side bending for ADLS with </= 1/50 pain (07/04/16)   Time 6   Period Weeks   Status New     PT LONG TERM GOAL #3   Title He will be able to lift and carrying >/= 25# utilizing proper posture and mechanics with </= 2/10 pain assist with lifting and carrying his grandchild (07/04/2016)   Time 6   Period Weeks   Status New     PT LONG TERM GOAL #4   Title pt will increase his FOTO score to </= 37% limited to demonstrate improvement in function at discharged (07/04/2016)   Time 6   Period Weeks   Status New               Plan - 06/06/16 1013    Clinical Impression Statement [name] reports decreased pain and tightness today. focused on hip stretching and glute strengthening which he performed well. decided to heat pt in prone with him laying flat vs hiking in R hip up.    PT Treatment/Interventions ADLs/Self Care Home Management;Electrical Stimulation;Iontophoresis 1m/ml Dexamethasone;Cryotherapy;Dry needling;Taping;Passive range of motion;Patient/family education;Therapeutic activities;Therapeutic exercise;Moist Heat;Manual techniques;Ultrasound;Balance training   PT Next Visit Plan assess/ review HEP, hip flexor stretching,  hamstring activation, core strengthening/ hip strengthening, stretching of the low back, manual   Consulted and Agree with Plan of Care Patient      Patient will benefit from skilled therapeutic intervention in  order to improve the following deficits and impairments:  Pain, Decreased endurance, Decreased activity tolerance, Decreased strength, Improper body mechanics, Postural dysfunction, Decreased range of motion  Visit Diagnosis: Muscle spasm of back  Chronic right-sided low back pain without sciatica  Abnormal posture  Muscle weakness (generalized)     Problem List Patient Active Problem List   Diagnosis Date Noted  . Chest pain, non-cardiac 10/09/2015  . Acute on chronic renal failure ([place]) 06/16/2014  . Renal failure (ARF), acute on chronic (HCC) 06/16/2014  . Shoulder pain, left 12/15/2013  . Chest pain 12/15/2013  . Claudication ([place]) 12/11/2013  . PVD (peripheral vascular disease) ([place]) 12/11/2013  . Peripheral arterial disease ([place]) 09/30/2013  . Carotid artery disease ([place]) 09/30/2013  . Acute chest pain 11/15/2012  . Left-sided chest wall pain 03/19/2012  . Bruit 09/15/2010  . CAD (coronary artery disease) nonobstructive per cath 2012   . Chronic kidney disease (CKD), stage IV (severe) ([place])   . CHEST PAIN, PRECORDIAL 07/26/2009  . DM (diabetes mellitus) ([place]) 07/22/2009  . Hyperlipidemia LDL goal <70 07/22/2009  . Hypertension 07/22/2009   Starr Lake PT, DPT, LAT, ATC  06/06/16  10:16 AM      [facility] [address] [place], [place], [address] Phone: [phone]   Fax:  [phone]  Name: Jon Anderson MRN: [id number] Date of Birth: 11-11-1950

## 2016-06-13 ENCOUNTER — Ambulatory Visit: Payer: BC Managed Care – PPO | Attending: Nephrology | Admitting: Physical Therapy

## 2016-06-13 DIAGNOSIS — M545 Low back pain: Secondary | ICD-10-CM | POA: Diagnosis present

## 2016-06-13 DIAGNOSIS — R293 Abnormal posture: Secondary | ICD-10-CM | POA: Diagnosis present

## 2016-06-13 DIAGNOSIS — G8929 Other chronic pain: Secondary | ICD-10-CM

## 2016-06-13 DIAGNOSIS — M6281 Muscle weakness (generalized): Secondary | ICD-10-CM | POA: Insufficient documentation

## 2016-06-13 DIAGNOSIS — M62830 Muscle spasm of back: Secondary | ICD-10-CM | POA: Diagnosis not present

## 2016-06-13 NOTE — Therapy (Signed)
[place], [place], [address] Phone: [phone]   Fax:  [phone]  Physical Therapy Treatment/ Discharge Note  Patient Details  Name: Jon Anderson MRN: [id number] Date of Birth: 02-17-1951 Referring Provider: Mauricia Area MD  Encounter Date: 06/13/2016      PT End of Session - 06/13/16 1010    Visit Number 5   Number of Visits 6   Date for PT Re-Evaluation 07/04/16   Authorization Type Medicare: Kx mod by 15th visit, progress note by 10th visit   PT Start Time 0932   PT Stop Time 1017   PT Time Calculation (min) 45 min   Activity Tolerance Patient tolerated treatment well   Behavior During Therapy [facility] for tasks assessed/performed      Past Medical History:  Diagnosis Date  . Anemia of chronic disease   . CAD (coronary artery disease)    a.  Myoview 4/11: EF 53%, no scar or ischemia   c. MV 2012 Nl perfusion, apical thinning.  No ischemia or scar.  EF 49%, appears greater by visual estimate.;  d.  Dob stress echo 12/13:  Negative Dob stress echo. There is no evidence of ischemia.  The LVF is normal. b. Normal cors 2016.  . Carotid stenosis    a. <63% RICA, >01% LICA by duplex 09/107  . Chronic chest pain   . ESRD (end stage renal disease) on dialysis ([place])   . GERD (gastroesophageal reflux disease)   . HTN (hypertension)    echo 3/10: EF 60%, LAE  . Hyperlipidemia   . Nephrolithiasis    "passed them all"  . Peripheral arterial disease ([place])    a. s/p PTCA to L SFA.  Marland Kitchen Pneumonia   . Snores    a. presumed OSA, pt has refused sleep eval in past.  . Type II diabetes mellitus ([place])     Past Surgical History:  Procedure Laterality Date  . ANGIOPLASTY / STENTING FEMORAL Left 12/11/2013   dr berry  . AV FISTULA PLACEMENT Left 03/19/2014   Procedure: CREATION OF ARTERIOVENOUS (AV) FISTULA  LEFT UPPER ARM;  Surgeon: Mal Misty, MD;  Location: [place];  Service: Vascular;  Laterality:  Left;  . CARDIAC CATHETERIZATION  2001 and 2010   . COLONOSCOPY W/ BIOPSIES AND POLYPECTOMY    . FOOT FRACTURE SURGERY Right   . FRACTURE SURGERY    . INGUINAL HERNIA REPAIR Left   . LEFT HEART CATHETERIZATION WITH CORONARY ANGIOGRAM N/A 06/22/2014   Procedure: LEFT HEART CATHETERIZATION WITH CORONARY ANGIOGRAM;  Surgeon: [place] Sine, MD;  Location: [facility] CATH LAB;  Service: Cardiovascular;  Laterality: N/A;  . LOWER EXTREMITY ANGIOGRAM Left 12/11/2013   Procedure: LOWER EXTREMITY ANGIOGRAM;  Surgeon: Lorretta Harp, MD;  Location: [facility] CATH LAB;  Service: Cardiovascular;  Laterality: Left;  . TONSILLECTOMY AND ADENOIDECTOMY      There were no vitals filed for this visit.      Subjective Assessment - 06/13/16 0939    Subjective "I was sore for 2 days following last session but I am doing good today"    Currently in Pain? Yes   Pain Score 3    Pain Location Back   Pain Orientation Right   Pain Descriptors / Indicators Aching;Sore   Pain Type Chronic pain   Pain Onset More than a month ago   Pain Frequency Constant   Aggravating Factors  prolonged sitting   Pain Relieving Factors medication  [facility] PT Assessment - 06/13/16 0001      Observation/Other Assessments   Focus on Therapeutic Outcomes (FOTO)  44% limited     AROM   Lumbar Flexion 62   Lumbar Extension 28   Lumbar - Right Side Bend 30   Lumbar - Left Side Bend 32     Strength   Right Hip Flexion 5/5   Right Hip Extension 3/5   Right Hip ABduction 4/5   Right Hip ADduction 5/5   Left Hip Flexion 5/5   Left Hip Extension 3/5   Left Hip ABduction 4/5   Left Hip ADduction 5/5                     [facility] Adult PT Treatment/Exercise - 06/13/16 0001      Lumbar Exercises: Aerobic   Stationary Bike nustep L5 x 8 min  UE/LE     Knee/Hip Exercises: Stretches   Hip Flexor Stretch 2 reps;30 seconds     Knee/Hip Exercises: Supine   Bridges with Ball Squeeze 2 sets;10 reps     Manual  Therapy   Joint Mobilization Long axis distraction Grade 3-4   Soft tissue mobilization IASTM over the R rectus femoris                 PT Education - 06/13/16 1052    Education provided Yes   Education Details maintaining posture and lifting biomechanics. how to progress strength by continued to increase reps/ sets to promote function.    Person(s) Educated Patient   Methods Explanation;Verbal cues;Handout   Comprehension Verbalized understanding;Verbal cues required          PT Short Term Goals - 06/13/16 1008      PT SHORT TERM GOAL #1   Title pt will be I with inital HEP (06/06/2016)   Time 3   Period Weeks   Status Achieved     PT SHORT TERM GOAL #2   Title He will be able to verbalize and demo proper posture and lifting/ carrying mechanics to prevent and reduce low back pain (06/06/2016)   Time 3   Period Weeks   Status Partially Met           PT Long Term Goals - 06/13/16 1008      PT LONG TERM GOAL #1   Title He will be I with all HEP given as of last visit (07/04/16)   Time 6   Period Weeks   Status Achieved     PT LONG TERM GOAL #2   Title pt will increase trunk mobility by >/= 10 degrees for flexion, and 5 >/= 5 degrees for extension and bil side bending for ADLS with </= 3/23 pain (07/04/16)   Time 6   Period Weeks   Status Achieved     PT LONG TERM GOAL #3   Title He will be able to lift and carrying >/= 25# utilizing proper posture and mechanics with </= 2/10 pain assist with lifting and carrying his grandchild (07/04/2016)   Time 6   Period Weeks   Status Partially Met               Plan - 06/13/16 1053    Clinical Impression Statement [name] states he is doing well and that today will be his last visit. He has increased his trunk mobility, and increased strength in bil LE. He met or partially met his goals. He is able to maintain and progress his  current level of function independently and will be DC from PT.    PT Next  Visit Plan D/C   Consulted and Agree with Plan of Care Patient      Patient will benefit from skilled therapeutic intervention in order to improve the following deficits and impairments:  Pain, Decreased endurance, Decreased activity tolerance, Decreased strength, Improper body mechanics, Postural dysfunction, Decreased range of motion  Visit Diagnosis: Muscle spasm of back  Chronic right-sided low back pain without sciatica  Abnormal posture  Muscle weakness (generalized)       G-Codes - 06-22-16 1055    Functional Assessment Tool Used (Outpatient Only) FOTO/ clinical judgement   Functional Limitation Changing and maintaining body position   Changing and Maintaining Body Position Goal Status (X5072) At least 20 percent but less than 40 percent impaired, limited or restricted   Changing and Maintaining Body Position Discharge Status (U5750) At least 20 percent but less than 40 percent impaired, limited or restricted      Problem List Patient Active Problem List   Diagnosis Date Noted  . Chest pain, non-cardiac 10/09/2015  . Acute on chronic renal failure ([place]) 06/16/2014  . Renal failure (ARF), acute on chronic (HCC) 06/16/2014  . Shoulder pain, left 12/15/2013  . Chest pain 12/15/2013  . Claudication ([place]) 12/11/2013  . PVD (peripheral vascular disease) ([place]) 12/11/2013  . Peripheral arterial disease ([place]) 09/30/2013  . Carotid artery disease ([place]) 09/30/2013  . Acute chest pain 11/15/2012  . Left-sided chest wall pain 03/19/2012  . Bruit 09/15/2010  . CAD (coronary artery disease) nonobstructive per cath 2012   . Chronic kidney disease (CKD), stage IV (severe) ([place])   . CHEST PAIN, PRECORDIAL 07/26/2009  . DM (diabetes mellitus) ([place]) 07/22/2009  . Hyperlipidemia LDL goal <70 07/22/2009  . Hypertension 07/22/2009   Starr Lake PT, DPT, LAT, ATC  22-Jun-2016  10:56 AM      [facility] [address] [place], [place], [address] Phone: [phone]   Fax:  [phone]  Name: Jon Anderson MRN: [id number] Date of Birth: [birth year]        PHYSICAL THERAPY DISCHARGE SUMMARY  Visits from Start of Care: 5  Current functional level related to goals / functional outcomes: FOTO 34% limited   Remaining deficits: See assessment / general fatigue with prolonged activity.   Education / Equipment: HEP, posture, lifting biomechanics, therband Plan: Patient agrees to discharge.  Patient goals were partially met. Patient is being discharged due to being pleased with the current functional level.  ?????    Dartanyon Frankowski PT, DPT, LAT, ATC  2016-06-22  10:58 AM

## 2016-06-14 ENCOUNTER — Emergency Department (HOSPITAL_COMMUNITY): Payer: BC Managed Care – PPO

## 2016-06-14 ENCOUNTER — Encounter (HOSPITAL_COMMUNITY): Payer: Self-pay

## 2016-06-14 ENCOUNTER — Emergency Department (HOSPITAL_COMMUNITY)
Admission: EM | Admit: 2016-06-14 | Discharge: 2016-06-14 | Disposition: A | Discharge status: Home / Self Care | Payer: BC Managed Care – PPO | Attending: Emergency Medicine | Admitting: Emergency Medicine

## 2016-06-14 DIAGNOSIS — Z79899 Other long term (current) drug therapy: Secondary | ICD-10-CM | POA: Insufficient documentation

## 2016-06-14 DIAGNOSIS — R0789 Other chest pain: Secondary | ICD-10-CM | POA: Insufficient documentation

## 2016-06-14 DIAGNOSIS — I120 Hypertensive chronic kidney disease with stage 5 chronic kidney disease or end stage renal disease: Secondary | ICD-10-CM | POA: Insufficient documentation

## 2016-06-14 DIAGNOSIS — Z7982 Long term (current) use of aspirin: Secondary | ICD-10-CM | POA: Diagnosis not present

## 2016-06-14 DIAGNOSIS — Z794 Long term (current) use of insulin: Secondary | ICD-10-CM | POA: Diagnosis not present

## 2016-06-14 DIAGNOSIS — N186 End stage renal disease: Secondary | ICD-10-CM | POA: Insufficient documentation

## 2016-06-14 DIAGNOSIS — E1122 Type 2 diabetes mellitus with diabetic chronic kidney disease: Secondary | ICD-10-CM | POA: Diagnosis not present

## 2016-06-14 DIAGNOSIS — Z992 Dependence on renal dialysis: Secondary | ICD-10-CM | POA: Diagnosis not present

## 2016-06-14 DIAGNOSIS — R079 Chest pain, unspecified: Secondary | ICD-10-CM

## 2016-06-14 DIAGNOSIS — R072 Precordial pain: Secondary | ICD-10-CM | POA: Diagnosis present

## 2016-06-14 DIAGNOSIS — I2510 Atherosclerotic heart disease of native coronary artery without angina pectoris: Secondary | ICD-10-CM | POA: Diagnosis not present

## 2016-06-14 DIAGNOSIS — Z87891 Personal history of nicotine dependence: Secondary | ICD-10-CM | POA: Diagnosis not present

## 2016-06-14 LAB — CBC
HCT: 37.5 % — ABNORMAL LOW (ref 39.0–52.0)
Hemoglobin: 12.5 g/dL — ABNORMAL LOW (ref 13.0–17.0)
MCH: 30.3 pg (ref 26.0–34.0)
MCHC: 33.3 g/dL (ref 30.0–36.0)
MCV: 91 fL (ref 78.0–100.0)
Platelets: 251 10*3/uL (ref 150–400)
RBC: 4.12 MIL/uL — ABNORMAL LOW (ref 4.22–5.81)
RDW: 14 % (ref 11.5–15.5)
WBC: 10.6 10*3/uL — ABNORMAL HIGH (ref 4.0–10.5)

## 2016-06-14 LAB — BASIC METABOLIC PANEL
Anion gap: 13 (ref 5–15)
BUN: 28 mg/dL — ABNORMAL HIGH (ref 6–20)
CO2: 29 mmol/L (ref 22–32)
Calcium: 8.1 mg/dL — ABNORMAL LOW (ref 8.9–10.3)
Chloride: 95 mmol/L — ABNORMAL LOW (ref 101–111)
Creatinine, Ser: 6.91 mg/dL — ABNORMAL HIGH (ref 0.61–1.24)
GFR calc Af Amer: 9 mL/min — ABNORMAL LOW (ref >60)
GFR calc non Af Amer: 7 mL/min — ABNORMAL LOW (ref >60)
Glucose, Bld: 104 mg/dL — ABNORMAL HIGH (ref 65–99)
Potassium: 3.3 mmol/L — ABNORMAL LOW (ref 3.5–5.1)
Sodium: 137 mmol/L (ref 135–145)

## 2016-06-14 LAB — I-STAT TROPONIN, ED
Troponin i, poc: 0.04 ng/mL (ref 0.00–0.08)
Troponin i, poc: 0.03 ng/mL (ref 0.00–0.08)

## 2016-06-14 MED ORDER — METOCLOPRAMIDE HCL 5 MG/ML IJ SOLN
10.0000 mg | Freq: Once | INTRAMUSCULAR | Status: AC
  Administered 2016-06-14: 10 mg via INTRAVENOUS
  Filled 2016-06-14: qty 2

## 2016-06-14 MED ORDER — GI COCKTAIL ~~LOC~~
30.0000 mL | Freq: Once | ORAL | Status: AC
  Administered 2016-06-14: 30 mL via ORAL
  Filled 2016-06-14: qty 30

## 2016-06-14 MED ORDER — ONDANSETRON HCL 4 MG/2ML IJ SOLN
4.0000 mg | Freq: Once | INTRAMUSCULAR | Status: AC
  Administered 2016-06-14: 4 mg via INTRAVENOUS
  Filled 2016-06-14: qty 2

## 2016-06-14 MED ORDER — MORPHINE SULFATE (PF) 4 MG/ML IV SOLN
4.0000 mg | Freq: Once | INTRAVENOUS | Status: AC
  Administered 2016-06-14: 4 mg via INTRAVENOUS
  Filled 2016-06-14: qty 1

## 2016-06-14 MED ORDER — ONDANSETRON HCL 4 MG PO TABS: 4.0000 mg | ORAL_TABLET | Freq: Four times a day (QID) | ORAL | 0 refills | Status: DC | PRN

## 2016-06-14 NOTE — Discharge Instructions (Signed)
Please follow-up closely with your PCP  Take nausea medication as needed  Return for worsening symptoms, including fever, worsening pain, confusion, intractable vomiting or any other symptoms concerning to you.

## 2016-06-14 NOTE — ED Notes (Signed)
ED Provider at bedside. 

## 2016-06-14 NOTE — ED Notes (Signed)
IV team at bedside,  

## 2016-06-14 NOTE — ED Provider Notes (Signed)
[facility] DEPT Provider Note   CSN: [id number] Arrival date & time: 06/14/16  1028     History   Chief Complaint Chief Complaint  Patient presents with  . Chest Pain    HPI Jon Anderson is a 66 y.o. male.  The history is provided by the patient.  Chest Pain   This is a new problem. The current episode started 1 to 2 hours ago. The problem occurs constantly. The problem has not changed since onset.The pain is associated with breathing and rest. The pain is present in the substernal region. The pain is moderate. The quality of the pain is described as pressure-like. The pain does not radiate. The symptoms are aggravated by deep breathing. Associated symptoms include cough, nausea and vomiting. Pertinent negatives include no abdominal pain, no back pain, no fever, no leg pain, no lower extremity edema, no near-syncope, no palpitations, no shortness of breath and no syncope. He has tried nitroglycerin for the symptoms. The treatment provided no relief. Risk factors include male gender and obesity.  His past medical history is significant for CAD, diabetes, hyperlipidemia and hypertension.      66 year old male who presents with chest pain. History of atypical chest pain, with left heart catheterization in March 2016 showing normal coronary arteries. History of end-stage renal disease, hypertension, hyperlipidemia, diabetes, and peripheral vascular disease status post stenting. Onset of chest pressure moderate to severe in nature that has been constant since since the last hour of dialysis. Since the ED for ongoing chest pain. States that this is similar to prior history of chest pain in the past. Has had mild nonproductive cough. Had some vomiting and diarrhea earlier this morning, nonbloody. No fevers or chills. No swelling of the legs or leg pain. Chest pain is not positional. It does seem to be more noticeable when he takes a deep breath in. No relief with nitro or ASA.  Past  Medical History:  Diagnosis Date  . Anemia of chronic disease   . CAD (coronary artery disease)    a.  Myoview 4/11: EF 53%, no scar or ischemia   c. MV 2012 Nl perfusion, apical thinning.  No ischemia or scar.  EF 49%, appears greater by visual estimate.;  d.  Dob stress echo 12/13:  Negative Dob stress echo. There is no evidence of ischemia.  The LVF is normal. b. Normal cors 2016.  . Carotid stenosis    a. <42% RICA, >59% LICA by duplex 08/6385  . Chronic chest pain   . ESRD (end stage renal disease) on dialysis ([place])   . GERD (gastroesophageal reflux disease)   . HTN (hypertension)    echo 3/10: EF 60%, LAE  . Hyperlipidemia   . Nephrolithiasis    "passed them all"  . Peripheral arterial disease ([place])    a. s/p PTCA to L SFA.  Marland Kitchen Pneumonia   . Snores    a. presumed OSA, pt has refused sleep eval in past.  . Type II diabetes mellitus [facility])     Patient Active Problem List   Diagnosis Date Noted  . Chest pain, non-cardiac 10/09/2015  . Acute on chronic renal failure ([place]) 06/16/2014  . Renal failure (ARF), acute on chronic (HCC) 06/16/2014  . Shoulder pain, left 12/15/2013  . Chest pain 12/15/2013  . Claudication ([place]) 12/11/2013  . PVD (peripheral vascular disease) ([place]) 12/11/2013  . Peripheral arterial disease ([place]) 09/30/2013  . Carotid artery disease ([place]) 09/30/2013  . Acute chest pain 11/15/2012  .  Left-sided chest wall pain 03/19/2012  . Bruit 09/15/2010  . CAD (coronary artery disease) nonobstructive per cath 2012   . Chronic kidney disease (CKD), stage IV (severe) ([place])   . CHEST PAIN, PRECORDIAL 07/26/2009  . DM (diabetes mellitus) ([place]) 07/22/2009  . Hyperlipidemia LDL goal <70 07/22/2009  . Hypertension 07/22/2009    Past Surgical History:  Procedure Laterality Date  . ANGIOPLASTY / STENTING FEMORAL Left 12/11/2013   dr berry  . AV FISTULA PLACEMENT Left 03/19/2014   Procedure: CREATION OF ARTERIOVENOUS (AV) FISTULA  LEFT UPPER ARM;  Surgeon: Mal Misty, MD;  Location: [facility];  Service: Vascular;  Laterality: Left;  . CARDIAC CATHETERIZATION  2001 and 2010   . COLONOSCOPY W/ BIOPSIES AND POLYPECTOMY    . FOOT FRACTURE SURGERY Right   . FRACTURE SURGERY    . INGUINAL HERNIA REPAIR Left   . LEFT HEART CATHETERIZATION WITH CORONARY ANGIOGRAM N/A 06/22/2014   Procedure: LEFT HEART CATHETERIZATION WITH CORONARY ANGIOGRAM;  Surgeon: Troy Sine, MD;  Location: [facility] LAB;  Service: Cardiovascular;  Laterality: N/A;  . LOWER EXTREMITY ANGIOGRAM Left 12/11/2013   Procedure: LOWER EXTREMITY ANGIOGRAM;  Surgeon: Lorretta Harp, MD;  Location: [facility] CATH LAB;  Service: Cardiovascular;  Laterality: Left;  . TONSILLECTOMY AND ADENOIDECTOMY         Home Medications    Prior to Admission medications   Medication Sig Start Date End Date Taking? Authorizing Provider  aspirin EC 81 MG tablet Take 1 tablet (81 mg total) by mouth daily. 05/28/12  Yes Liliane Shi, PA-C  atorvastatin (LIPITOR) 40 MG tablet Take 1 tablet (40 mg total) by mouth daily at 6 PM. 12/13/13  Yes Brett Canales, PA-C  calcium acetate (PHOSLO) 667 MG capsule Take 3 capsules (2,001 mg total) by mouth 3 (three) times daily with meals. 10/09/15  Yes Janece Canterbury, MD  cinacalcet (SENSIPAR) 60 MG tablet Take 60 mg by mouth daily.   Yes Historical Provider, MD  clonazePAM (KLONOPIN) 0.5 MG tablet TAKE 1 TABLET BY MOUTH AT BEDTIME 09/22/14  Yes Historical Provider, MD  clopidogrel (PLAVIX) 75 MG tablet Take 1 tablet (75 mg total) by mouth daily with breakfast. 12/13/13  Yes Brett Canales, PA-C  gabapentin (NEURONTIN) 100 MG capsule Take 100-200 mg by mouth See admin instructions. Pt takes 100mg  morning and afternoon, and 200mg  at night 10/02/14  Yes Historical Provider, MD  HYDROcodone-acetaminophen (NORCO/VICODIN) 5-325 MG tablet Take 1 tablet by mouth every 6 (six) hours as needed for pain. 06/01/16  Yes Historical Provider, MD  insulin aspart (NOVOLOG) 100 UNIT/ML injection Inject 0.75  Units into the skin daily as needed for high blood sugar. Pt is currently out of medication for pump. Pt is using novolog injections in its place.    Yes Historical Provider, MD  multivitamin (RENA-VIT) TABS tablet Take 1 tablet by mouth at bedtime. 06/23/14  Yes Domenic Polite, MD  nitroGLYCERIN (NITROSTAT) 0.4 MG SL tablet Place 1 tablet (0.4 mg total) under the tongue every 5 (five) minutes as needed for chest pain. 03/20/12  Yes Rhonda G Barrett, PA-C  NOVOLOG 100 UNIT/ML injection 0.75 units per hour via insulin pump per pt - - USE AS DIRECTED (TRITRATE AS NEEDED) *MAX 50 UNITS A DAY* 08/28/14  Yes Historical Provider, MD  pantoprazole (PROTONIX) 40 MG tablet Take 40 mg by mouth daily.   Yes Historical Provider, MD  rOPINIRole (REQUIP) 0.5 MG tablet Take 0.5 mg by mouth 3 (three) times daily.  Yes Historical Provider, MD  ondansetron (ZOFRAN) 4 MG tablet Take 1 tablet (4 mg total) by mouth every 6 (six) hours as needed for nausea or vomiting. 06/14/16   Forde Dandy, MD  traMADol (ULTRAM) 50 MG tablet Take 1 tablet (50 mg total) by mouth every 6 (six) hours as needed. Patient not taking: Reported on 06/14/2016 04/18/16   Daleen Bo, MD    Family History Family History  Problem Relation Age of Onset  . Heart attack Sister     died @ 51  . Cancer Mother     died @ 73; unknown type  . Diabetes Brother     deceased  . Cirrhosis Father     alcohol related  . Diabetes Father   . Esophageal cancer Neg Hx   . Colon cancer Neg Hx   . Pancreatic cancer Neg Hx   . Stomach cancer Neg Hx     Social History Social History  Substance Use Topics  . Smoking status: Former Smoker    Packs/day: 1.00    Years: 2.00    Types: Cigarettes  . Smokeless tobacco: Never Used     Comment: 06/16/2014 "quit smoking cigarettes in the early 1970's"  . Alcohol use No     Allergies   Kiwi extract and Tape   Review of Systems Review of Systems  Constitutional: Negative for fever.  HENT: Negative for  congestion.   Respiratory: Positive for cough. Negative for shortness of breath.   Cardiovascular: Positive for chest pain. Negative for palpitations, syncope and near-syncope.  Gastrointestinal: Positive for nausea and vomiting. Negative for abdominal pain.  Musculoskeletal: Negative for back pain.  Allergic/Immunologic: Negative for immunocompromised state.  Neurological: Negative for syncope.  Hematological: Bruises/bleeds easily (plavix).  Psychiatric/Behavioral: Negative for confusion.  All other systems reviewed and are negative.    Physical Exam Updated Vital Signs BP 138/75   Pulse 67   Temp 97.6 F (36.4 C) (Oral)   Resp 18   SpO2 93%   Physical Exam Physical Exam  Nursing note and vitals reviewed. Constitutional:  non-toxic, and in mild acute distress 2/2 to pain Head: Normocephalic and atraumatic.  Mouth/Throat: Oropharynx is clear and moist.  Neck: Normal range of motion. Neck supple.  Cardiovascular: Normal rate and regular rhythm. No edema   Pulmonary/Chest: Effort normal and breath sounds normal.  Abdominal: Soft. There is no tenderness. There is no rebound and no guarding.  Musculoskeletal: Normal range of motion. no calf tenderness Neurological: Alert, no facial droop, fluent speech, moves all extremities symmetrically Skin: Skin is warm and dry.  Psychiatric: Cooperative   ED Treatments / Results  Labs (all labs ordered are listed, but only abnormal results are displayed) Labs Reviewed  BASIC METABOLIC PANEL - Abnormal; Notable for the following:       Result Value   Potassium 3.3 (*)    Chloride 95 (*)    Glucose, Bld 104 (*)    BUN 28 (*)    Creatinine, Ser 6.91 (*)    Calcium 8.1 (*)    GFR calc non Af Amer 7 (*)    GFR calc Af Amer 9 (*)    All other components within normal limits  CBC - Abnormal; Notable for the following:    WBC 10.6 (*)    RBC 4.12 (*)    Hemoglobin 12.5 (*)    HCT 37.5 (*)    All other components within normal  limits  I-STAT TROPOININ, ED  Randolm Idol, ED  EKG  EKG Interpretation  Date/Time:  Wednesday June 14 2016 10:35:16 EST Ventricular Rate:  74 PR Interval:    QRS Duration: 97 QT Interval:  390 QTC Calculation: 433 R Axis:   71 Text Interpretation:  Sinus rhythm Probable anteroseptal infarct, recent Baseline wander in lead(s) V5 ST depressions 04/18/2016 resolved Confirmed by Marlisha Vanwyk MD, Daved Mcfann [phone]) on 06/14/2016 10:46:08 AM       Radiology Dg Chest 2 View  Result Date: 06/14/2016 CLINICAL DATA:  Chest pain for 2 days EXAM: CHEST  2 VIEW COMPARISON:  04/18/2016 FINDINGS: Cardiac shadow is stable. The lungs are well aerated bilaterally. Very minimal basilar atelectasis is noted projecting over the posterior costophrenic angle. This likely is present on the left. No other focal abnormality is seen. IMPRESSION: Left basilar atelectasis. Electronically Signed   By: Inez Catalina M.D.   On: 06/14/2016 11:36    Procedures Procedures (including critical care time)   EMERGENCY DEPARTMENT [place] CARDIAC EXAM "Study: Limited Ultrasound of the Heart and Pericardium"  INDICATIONS:Chest pain Multiple views of the heart and pericardium were obtained in real-time with a multi-frequency probe.  PERFORMED HE:NIDPOE IMAGES ARCHIVED?: Yes LIMITATIONS:  Body habitus VIEWS USED: Parasternal long axis, Parasternal short axis and Apical 4 chamber  INTERPRETATION: Cardiac activity present, Pericardial effusioin absent and Normal contractility   Medications Ordered in ED Medications  ondansetron (ZOFRAN) injection 4 mg (4 mg Intravenous Given 06/14/16 1109)  morphine 4 MG/ML injection 4 mg (4 mg Intravenous Given 06/14/16 1109)  metoCLOPramide (REGLAN) injection 10 mg (10 mg Intravenous Given 06/14/16 1247)  gi cocktail (Maalox,Lidocaine,Donnatal) (30 mLs Oral Given 06/14/16 1245)     Initial Impression / Assessment and Plan / ED Course  I have reviewed the triage vital signs and the nursing  notes.  Pertinent labs & imaging results that were available during my care of the patient were reviewed by me and considered in my medical decision making (see chart for details).     66 year old male With history of end-stage renal disease, diabetes, hypertension, PVD who presents with atypical chest pain.   I reviewed the available records in [facility] and also Care Everywhere, updated the chart. Negative left heart catheterization in March 2016, normal coronaries. Also recent admissions in July 2017 for chest pain during dialysis, and seen by cardiology, who felt that patient not likely to have cardiac chest pain and recommended other noncardiac evaluations of his chest pain from here on out.  Does actively have chest pain on my evaluation, but resolved after analgesics, GI cocktail, and antiemetics. Question potential GI cause given that he's had some tight diarrhea and vomiting earlier this morning. Also typically has atypical chest pain sometimes during dialysis which she states is very similar to his prior atypical chest pains. Low suspicion that this is ACS related. Still with many risk factors. Discussed with Dr. Burt Knack, who felt patient unlikely to have new lesions since 2016. Recommended serial troponin and discharge if normal.  Serial troponins normal. EKG non-Ischemic. Chest x-ray visualized and does not show acute cardio pulmonary processes. Bedside ultrasound also performed, and there is no evidence of pericardial effusion. Unlikely pericarditis as went for usual dialysis. Low suspicion PE or dissection.   The patient appears reasonably screened and/or stabilized for discharge and I doubt any other medical condition or other [facility] requiring further screening, evaluation, or treatment in the ED at this time prior to discharge.  Strict return and follow-up instructions reviewed. He expressed understanding of all discharge instructions  and felt comfortable with the plan of care.   Final  Clinical Impressions(s) / ED Diagnoses   Final diagnoses:  Nonspecific chest pain    New Prescriptions New Prescriptions   ONDANSETRON (ZOFRAN) 4 MG TABLET    Take 1 tablet (4 mg total) by mouth every 6 (six) hours as needed for nausea or vomiting.     Forde Dandy, MD 06/14/16 [phone]

## 2016-06-14 NOTE — ED Notes (Signed)
Pt stable, understands discharge instructions, and reasons for return.   

## 2016-06-14 NOTE — ED Triage Notes (Signed)
Pt presents via [facility] for evaluation of central chest pressure starting today. Pt was receiving regular dialysis treatment, approximately half way through treatment when pain began. Pt reports N/V/D through night, denies N/V on arrival. Reports given imodium by dialysis staff. Pt given 324 ASA and 1 Nitro with no improvement to pain.

## 2016-06-15 DIAGNOSIS — Z01818 Encounter for other preprocedural examination: Secondary | ICD-10-CM | POA: Insufficient documentation

## 2016-06-23 ENCOUNTER — Encounter (HOSPITAL_COMMUNITY): Payer: Self-pay | Admitting: Emergency Medicine

## 2016-06-23 ENCOUNTER — Emergency Department (HOSPITAL_COMMUNITY)
Admission: EM | Admit: 2016-06-23 | Discharge: 2016-06-24 | Disposition: A | Discharge status: Home / Self Care | Payer: BC Managed Care – PPO | Attending: Emergency Medicine | Admitting: Emergency Medicine

## 2016-06-23 DIAGNOSIS — E1122 Type 2 diabetes mellitus with diabetic chronic kidney disease: Secondary | ICD-10-CM | POA: Diagnosis not present

## 2016-06-23 DIAGNOSIS — I2510 Atherosclerotic heart disease of native coronary artery without angina pectoris: Secondary | ICD-10-CM | POA: Diagnosis not present

## 2016-06-23 DIAGNOSIS — Z87891 Personal history of nicotine dependence: Secondary | ICD-10-CM | POA: Diagnosis not present

## 2016-06-23 DIAGNOSIS — I120 Hypertensive chronic kidney disease with stage 5 chronic kidney disease or end stage renal disease: Secondary | ICD-10-CM | POA: Insufficient documentation

## 2016-06-23 DIAGNOSIS — Z794 Long term (current) use of insulin: Secondary | ICD-10-CM | POA: Insufficient documentation

## 2016-06-23 DIAGNOSIS — Z7982 Long term (current) use of aspirin: Secondary | ICD-10-CM | POA: Insufficient documentation

## 2016-06-23 DIAGNOSIS — N186 End stage renal disease: Secondary | ICD-10-CM | POA: Insufficient documentation

## 2016-06-23 DIAGNOSIS — Z992 Dependence on renal dialysis: Secondary | ICD-10-CM | POA: Diagnosis not present

## 2016-06-23 DIAGNOSIS — R040 Epistaxis: Secondary | ICD-10-CM | POA: Diagnosis not present

## 2016-06-23 LAB — I-STAT CHEM 8, ED
BUN: 22 mg/dL — ABNORMAL HIGH (ref 6–20)
Calcium, Ion: 0.97 mmol/L — ABNORMAL LOW (ref 1.15–1.40)
Chloride: 97 mmol/L — ABNORMAL LOW (ref 101–111)
Creatinine, Ser: 7.3 mg/dL — ABNORMAL HIGH (ref 0.61–1.24)
Glucose, Bld: 124 mg/dL — ABNORMAL HIGH (ref 65–99)
HCT: 34 % — ABNORMAL LOW (ref 39.0–52.0)
Hemoglobin: 11.6 g/dL — ABNORMAL LOW (ref 13.0–17.0)
Potassium: 3.9 mmol/L (ref 3.5–5.1)
Sodium: 140 mmol/L (ref 135–145)
TCO2: 31 mmol/L (ref 0–100)

## 2016-06-23 MED ORDER — PHENYLEPHRINE HCL 0.5 % NA SOLN
1.0000 [drp] | Freq: Once | NASAL | Status: AC
  Administered 2016-06-23: 1 [drp] via NASAL
  Filled 2016-06-23: qty 15

## 2016-06-23 NOTE — ED Provider Notes (Signed)
[facility] DEPT Provider Note   CSN: [id number] Arrival date & time: 06/23/16  2030     History   Chief Complaint Chief Complaint  Patient presents with  . Epistaxis    HPI Jon Anderson is a 66 y.o. male.  Patient presents with epistaxis since 6:00. No history of similar. Patient is on Plavix for history of vascular disease and he is also a end-stage renal disease patient.  Bleeding intermittent. No syncope.      Past Medical History:  Diagnosis Date  . Anemia of chronic disease   . CAD (coronary artery disease)    a.  Myoview 4/11: EF 53%, no scar or ischemia   c. MV 2012 Nl perfusion, apical thinning.  No ischemia or scar.  EF 49%, appears greater by visual estimate.;  d.  Dob stress echo 12/13:  Negative Dob stress echo. There is no evidence of ischemia.  The LVF is normal. b. Normal cors 2016.  . Carotid stenosis    a. <16% RICA, >96% LICA by duplex 10/8936  . Chronic chest pain   . ESRD (end stage renal disease) on dialysis ([place])   . GERD (gastroesophageal reflux disease)   . HTN (hypertension)    echo 3/10: EF 60%, LAE  . Hyperlipidemia   . Nephrolithiasis    "passed them all"  . Peripheral arterial disease ([place])    a. s/p PTCA to L SFA.  Marland Kitchen Pneumonia   . Snores    a. presumed OSA, pt has refused sleep eval in past.  . Type II diabetes mellitus [facility])     Patient Active Problem List   Diagnosis Date Noted  . Chest pain, non-cardiac 10/09/2015  . Acute on chronic renal failure ([place]) 06/16/2014  . Renal failure (ARF), acute on chronic (HCC) 06/16/2014  . Shoulder pain, left 12/15/2013  . Chest pain 12/15/2013  . Claudication ([place]) 12/11/2013  . PVD (peripheral vascular disease) ([place]) 12/11/2013  . Peripheral arterial disease ([place]) 09/30/2013  . Carotid artery disease ([place]) 09/30/2013  . Acute chest pain 11/15/2012  . Left-sided chest wall pain 03/19/2012  . Bruit 09/15/2010  . CAD (coronary artery disease) nonobstructive per cath 2012   .  Chronic kidney disease (CKD), stage IV (severe) ([place])   . CHEST PAIN, PRECORDIAL 07/26/2009  . DM (diabetes mellitus) ([place]) 07/22/2009  . Hyperlipidemia LDL goal <70 07/22/2009  . Hypertension 07/22/2009    Past Surgical History:  Procedure Laterality Date  . ANGIOPLASTY / STENTING FEMORAL Left 12/11/2013   dr berry  . AV FISTULA PLACEMENT Left 03/19/2014   Procedure: CREATION OF ARTERIOVENOUS (AV) FISTULA  LEFT UPPER ARM;  Surgeon: Mal Misty, MD;  Location: [facility];  Service: Vascular;  Laterality: Left;  . CARDIAC CATHETERIZATION  2001 and 2010   . COLONOSCOPY W/ BIOPSIES AND POLYPECTOMY    . FOOT FRACTURE SURGERY Right   . FRACTURE SURGERY    . INGUINAL HERNIA REPAIR Left   . LEFT HEART CATHETERIZATION WITH CORONARY ANGIOGRAM N/A 06/22/2014   Procedure: LEFT HEART CATHETERIZATION WITH CORONARY ANGIOGRAM;  Surgeon: Troy Sine, MD;  Location[facility] CATH LAB;  Service: Cardiovascular;  Laterality: N/A;  . LOWER EXTREMITY ANGIOGRAM Left 12/11/2013   Procedure: LOWER EXTREMITY ANGIOGRAM;  Surgeon: Lorretta Harp, MD;  Location[facility] CATH LAB;  Service: Cardiovascular;  Laterality: Left;  . TONSILLECTOMY AND ADENOIDECTOMY         Home Medications    Prior to Admission medications   Medication Sig Start Date  End Date Taking? Authorizing Provider  aspirin EC 81 MG tablet Take 1 tablet (81 mg total) by mouth daily. 05/28/12   Liliane Shi, PA-C  atorvastatin (LIPITOR) 40 MG tablet Take 1 tablet (40 mg total) by mouth daily at 6 PM. 12/13/13   Brett Canales, PA-C  calcium acetate (PHOSLO) 667 MG capsule Take 3 capsules (2,001 mg total) by mouth 3 (three) times daily with meals. 10/09/15   Janece Canterbury, MD  cinacalcet (SENSIPAR) 60 MG tablet Take 60 mg by mouth daily.    Historical Provider, MD  clonazePAM (KLONOPIN) 0.5 MG tablet TAKE 1 TABLET BY MOUTH AT BEDTIME 09/22/14   Historical Provider, MD  clopidogrel (PLAVIX) 75 MG tablet Take 1 tablet (75 mg total) by mouth daily with  breakfast. 12/13/13   Brett Canales, PA-C  gabapentin (NEURONTIN) 100 MG capsule Take 100-200 mg by mouth See admin instructions. Pt takes 100mg  morning and afternoon, and 200mg  at night 10/02/14   Historical Provider, MD  HYDROcodone-acetaminophen (NORCO/VICODIN) 5-325 MG tablet Take 1 tablet by mouth every 6 (six) hours as needed for pain. 06/01/16   Historical Provider, MD  insulin aspart (NOVOLOG) 100 UNIT/ML injection Inject 0.75 Units into the skin daily as needed for high blood sugar. Pt is currently out of medication for pump. Pt is using novolog injections in its place.     Historical Provider, MD  multivitamin (RENA-VIT) TABS tablet Take 1 tablet by mouth at bedtime. 06/23/14   Domenic Polite, MD  nitroGLYCERIN (NITROSTAT) 0.4 MG SL tablet Place 1 tablet (0.4 mg total) under the tongue every 5 (five) minutes as needed for chest pain. 03/20/12   Rhonda G Barrett, PA-C  NOVOLOG 100 UNIT/ML injection 0.75 units per hour via insulin pump per pt - - USE AS DIRECTED (TRITRATE AS NEEDED) *MAX 50 UNITS A DAY* 08/28/14   Historical Provider, MD  ondansetron (ZOFRAN) 4 MG tablet Take 1 tablet (4 mg total) by mouth every 6 (six) hours as needed for nausea or vomiting. 06/14/16   Forde Dandy, MD  pantoprazole (PROTONIX) 40 MG tablet Take 40 mg by mouth daily.    Historical Provider, MD  rOPINIRole (REQUIP) 0.5 MG tablet Take 0.5 mg by mouth 3 (three) times daily.     Historical Provider, MD  traMADol (ULTRAM) 50 MG tablet Take 1 tablet (50 mg total) by mouth every 6 (six) hours as needed. Patient not taking: Reported on 06/14/2016 04/18/16   Daleen Bo, MD    Family History Family History  Problem Relation Age of Onset  . Heart attack Sister     died @ 57  . Cancer Mother     died @ 72; unknown type  . Diabetes Brother     deceased  . Cirrhosis Father     alcohol related  . Diabetes Father   . Esophageal cancer Neg Hx   . Colon cancer Neg Hx   . Pancreatic cancer Neg Hx   . Stomach cancer Neg Hx       Social History Social History  Substance Use Topics  . Smoking status: Former Smoker    Packs/day: 1.00    Years: 2.00    Types: Cigarettes  . Smokeless tobacco: Never Used     Comment: 06/16/2014 "quit smoking cigarettes in the early 1970's"  . Alcohol use No     Allergies   Kiwi extract and Tape   Review of Systems Review of Systems  Constitutional: Negative for fever.  HENT: Positive  for nosebleeds.   Respiratory: Negative for shortness of breath.   Cardiovascular: Negative for chest pain.  Gastrointestinal: Negative for vomiting.  Neurological: Negative for light-headedness.     Physical Exam Updated Vital Signs BP (!) 161/92   Pulse 85   Temp 98.1 F (36.7 C) (Oral)   Resp 20   SpO2 95%   Physical Exam  Constitutional: He is oriented to person, place, and time. He appears well-developed and well-nourished.  HENT:  Head: Normocephalic.  Patient has bleeding posterior pharynx and right nare.  Direct visualization revealed anterior septal location of mild bleeding on right, no hematoma.  Eyes: Conjunctivae are normal. Right eye exhibits no discharge. Left eye exhibits no discharge.  Neck: Normal range of motion. Neck supple. No tracheal deviation present.  Cardiovascular: Normal rate.   Pulmonary/Chest: Effort normal.  Abdominal: Soft. He exhibits no distension. There is no tenderness. There is no guarding.  Musculoskeletal: He exhibits no edema.  Neurological: He is alert and oriented to person, place, and time.  Skin: Skin is warm. No rash noted.  Psychiatric: He has a normal mood and affect.  Nursing note and vitals reviewed.    ED Treatments / Results  Labs (all labs ordered are listed, but only abnormal results are displayed) Labs Reviewed  I-STAT CHEM 8, ED - Abnormal; Notable for the following:       Result Value   Chloride 97 (*)    BUN 22 (*)    Creatinine, Ser 7.30 (*)    Glucose, Bld 124 (*)    Calcium, Ion 0.97 (*)    Hemoglobin 11.6  (*)    HCT 34.0 (*)    All other components within normal limits    EKG  EKG Interpretation None       Radiology No results found.  Procedures Procedures (including critical care time)  Medications Ordered in ED Medications  phenylephrine (NEO-SYNEPHRINE) 0.5 % nasal solution 1 drop (1 drop Each Nare Given 06/23/16 2211)     Initial Impression / Assessment and Plan / ED Course  I have reviewed the triage vital signs and the nursing notes.  Pertinent labs & imaging results that were available during my care of the patient were reviewed by me and considered in my medical decision making (see chart for details).    Patient Plavix presents with epistaxis. After holding pressure and Neo-Synephrine along withsilver nitrate bleeding controlled. Discussed supportive care and outpt fup.  Results and differential diagnosis were discussed with the patient/parent/guardian. Xrays were independently reviewed by myself.  Close follow up outpatient was discussed, comfortable with the plan.   Medications  phenylephrine (NEO-SYNEPHRINE) 0.5 % nasal solution 1 drop (1 drop Each Nare Given 06/23/16 2211)    Vitals:   06/23/16 2124 06/23/16 2200 06/23/16 2300 06/23/16 2345  BP: (!) 158/94 (!) 160/87 (!) 161/92 (!) 165/89  Pulse: 93 91 85 90  Resp: (!) 22 20 20 17   Temp:      TempSrc:      SpO2: 98% 99% 95% 92%    Final diagnoses:  Epistaxis     Final Clinical Impressions(s) / ED Diagnoses   Final diagnoses:  Epistaxis    New Prescriptions New Prescriptions   No medications on file     Elnora Morrison, MD 06/24/16 0003

## 2016-06-23 NOTE — ED Notes (Signed)
ED Provider at bedside. 

## 2016-06-23 NOTE — Discharge Instructions (Signed)
If bleeding reoccurs please spray Neo-Synephrine in the nostril and hold pressure for 10 minutes. If this does not help despite trying this 3 times to have to come to the emergency department or call ENT.  If you were given medicines take as directed.  If you are on coumadin or contraceptives realize their levels and effectiveness is altered by many different medicines.  If you have any reaction (rash, tongues swelling, other) to the medicines stop taking and see a physician.    If your blood pressure was elevated in the ER make sure you follow up for management with a primary doctor or return for chest pain, shortness of breath or stroke symptoms.  Please follow up as directed and return to the ER or see a physician for new or worsening symptoms.  Thank you. Vitals:   06/23/16 2048 06/23/16 2124 06/23/16 2200 06/23/16 2300  BP: (!) 155/88 (!) 158/94 (!) 160/87 (!) 161/92  Pulse: 94 93 91 85  Resp: (!) 22 (!) 22 20 20   Temp: 98.1 F (36.7 C)     TempSrc: Oral     SpO2: 99% 98% 99% 95%

## 2016-06-23 NOTE — ED Triage Notes (Addendum)
C/o R nare bleeding since 6:30pm.  Pt takes Plavix.  Last dialysis today.  Nose still bleeding at this time.

## 2016-07-11 ENCOUNTER — Ambulatory Visit (HOSPITAL_COMMUNITY): Admission: AD | Admit: 2016-07-11 | Payer: Medicare Other | Source: Ambulatory Visit | Admitting: Gastroenterology

## 2016-07-11 ENCOUNTER — Ambulatory Visit (HOSPITAL_COMMUNITY): Admit: 2016-07-11 | Payer: Medicare Other | Admitting: Gastroenterology

## 2016-07-11 ENCOUNTER — Encounter (HOSPITAL_COMMUNITY): Payer: Self-pay

## 2016-07-11 ENCOUNTER — Other Ambulatory Visit: Payer: Self-pay | Admitting: Gastroenterology

## 2016-07-11 ENCOUNTER — Encounter (HOSPITAL_COMMUNITY): Admission: AD | Payer: Self-pay | Source: Ambulatory Visit

## 2016-07-11 SURGERY — ESOPHAGOGASTRODUODENOSCOPY (EGD) WITH PROPOFOL
Anesthesia: Moderate Sedation

## 2016-07-11 SURGERY — EGD (ESOPHAGOGASTRODUODENOSCOPY)
Anesthesia: Moderate Sedation

## 2016-07-28 ENCOUNTER — Encounter (HOSPITAL_COMMUNITY): Payer: Self-pay | Admitting: *Deleted

## 2016-07-31 NOTE — Progress Notes (Signed)
ekg 07-27-16 echo 07-27-16 carotid duplex 07-27-16 lov dr brown cardiology, transplant lov note dr Rosanna Randy 06-22-16 requested from [facility] by fax, fax confirmation received and placed on chart

## 2016-08-01 ENCOUNTER — Ambulatory Visit (HOSPITAL_COMMUNITY): Payer: BC Managed Care – PPO | Admitting: Anesthesiology

## 2016-08-01 ENCOUNTER — Ambulatory Visit (HOSPITAL_COMMUNITY)
Admission: RE | Admit: 2016-08-01 | Discharge: 2016-08-01 | Disposition: A | Discharge status: Home / Self Care | Payer: BC Managed Care – PPO | Source: Ambulatory Visit | Attending: Gastroenterology | Admitting: Gastroenterology

## 2016-08-01 ENCOUNTER — Encounter (HOSPITAL_COMMUNITY)
Admission: RE | Disposition: A | Discharge status: Home / Self Care | Payer: Self-pay | Source: Ambulatory Visit | Attending: Gastroenterology

## 2016-08-01 ENCOUNTER — Encounter (HOSPITAL_COMMUNITY): Payer: Self-pay

## 2016-08-01 DIAGNOSIS — K449 Diaphragmatic hernia without obstruction or gangrene: Secondary | ICD-10-CM | POA: Insufficient documentation

## 2016-08-01 DIAGNOSIS — K3189 Other diseases of stomach and duodenum: Secondary | ICD-10-CM | POA: Diagnosis not present

## 2016-08-01 DIAGNOSIS — D123 Benign neoplasm of transverse colon: Secondary | ICD-10-CM | POA: Insufficient documentation

## 2016-08-01 DIAGNOSIS — N186 End stage renal disease: Secondary | ICD-10-CM | POA: Insufficient documentation

## 2016-08-01 DIAGNOSIS — Z95820 Peripheral vascular angioplasty status with implants and grafts: Secondary | ICD-10-CM | POA: Insufficient documentation

## 2016-08-01 DIAGNOSIS — E1151 Type 2 diabetes mellitus with diabetic peripheral angiopathy without gangrene: Secondary | ICD-10-CM | POA: Insufficient documentation

## 2016-08-01 DIAGNOSIS — I120 Hypertensive chronic kidney disease with stage 5 chronic kidney disease or end stage renal disease: Secondary | ICD-10-CM | POA: Diagnosis not present

## 2016-08-01 DIAGNOSIS — R1310 Dysphagia, unspecified: Secondary | ICD-10-CM | POA: Diagnosis present

## 2016-08-01 DIAGNOSIS — I2510 Atherosclerotic heart disease of native coronary artery without angina pectoris: Secondary | ICD-10-CM | POA: Insufficient documentation

## 2016-08-01 DIAGNOSIS — K222 Esophageal obstruction: Secondary | ICD-10-CM | POA: Insufficient documentation

## 2016-08-01 DIAGNOSIS — Z992 Dependence on renal dialysis: Secondary | ICD-10-CM | POA: Insufficient documentation

## 2016-08-01 DIAGNOSIS — Z7902 Long term (current) use of antithrombotics/antiplatelets: Secondary | ICD-10-CM | POA: Insufficient documentation

## 2016-08-01 DIAGNOSIS — K5730 Diverticulosis of large intestine without perforation or abscess without bleeding: Secondary | ICD-10-CM | POA: Insufficient documentation

## 2016-08-01 DIAGNOSIS — Z7982 Long term (current) use of aspirin: Secondary | ICD-10-CM | POA: Diagnosis not present

## 2016-08-01 DIAGNOSIS — Z87891 Personal history of nicotine dependence: Secondary | ICD-10-CM | POA: Insufficient documentation

## 2016-08-01 DIAGNOSIS — E785 Hyperlipidemia, unspecified: Secondary | ICD-10-CM | POA: Insufficient documentation

## 2016-08-01 DIAGNOSIS — Z1211 Encounter for screening for malignant neoplasm of colon: Secondary | ICD-10-CM | POA: Insufficient documentation

## 2016-08-01 DIAGNOSIS — G2581 Restless legs syndrome: Secondary | ICD-10-CM | POA: Insufficient documentation

## 2016-08-01 DIAGNOSIS — Z8719 Personal history of other diseases of the digestive system: Secondary | ICD-10-CM | POA: Insufficient documentation

## 2016-08-01 DIAGNOSIS — K219 Gastro-esophageal reflux disease without esophagitis: Secondary | ICD-10-CM | POA: Insufficient documentation

## 2016-08-01 DIAGNOSIS — Z794 Long term (current) use of insulin: Secondary | ICD-10-CM | POA: Insufficient documentation

## 2016-08-01 DIAGNOSIS — E1122 Type 2 diabetes mellitus with diabetic chronic kidney disease: Secondary | ICD-10-CM | POA: Insufficient documentation

## 2016-08-01 DIAGNOSIS — Z79899 Other long term (current) drug therapy: Secondary | ICD-10-CM | POA: Diagnosis not present

## 2016-08-01 DIAGNOSIS — D120 Benign neoplasm of cecum: Secondary | ICD-10-CM | POA: Diagnosis not present

## 2016-08-01 HISTORY — PX: ESOPHAGOGASTRODUODENOSCOPY (EGD) WITH PROPOFOL: SHX5813

## 2016-08-01 HISTORY — PX: COLONOSCOPY WITH PROPOFOL: SHX5780

## 2016-08-01 HISTORY — DX: Restless legs syndrome: G25.81

## 2016-08-01 LAB — GLUCOSE, CAPILLARY
Glucose-Capillary: 66 mg/dL (ref 65–99)
Glucose-Capillary: 69 mg/dL (ref 65–99)

## 2016-08-01 SURGERY — ESOPHAGOGASTRODUODENOSCOPY (EGD) WITH PROPOFOL
Anesthesia: Monitor Anesthesia Care

## 2016-08-01 MED ORDER — LACTATED RINGERS IV SOLN
INTRAVENOUS | Status: DC | PRN
  Administered 2016-08-01: 12:00:00 via INTRAVENOUS

## 2016-08-01 MED ORDER — PROPOFOL 500 MG/50ML IV EMUL
INTRAVENOUS | Status: DC | PRN
  Administered 2016-08-01: 100 ug/kg/min via INTRAVENOUS

## 2016-08-01 MED ORDER — PROPOFOL 500 MG/50ML IV EMUL
INTRAVENOUS | Status: DC | PRN
  Administered 2016-08-01: 50 mg via INTRAVENOUS
  Administered 2016-08-01: 20 mg via INTRAVENOUS
  Administered 2016-08-01: 10 mg via INTRAVENOUS

## 2016-08-01 MED ORDER — PROPOFOL 10 MG/ML IV BOLUS
INTRAVENOUS | Status: AC
  Filled 2016-08-01: qty 40

## 2016-08-01 SURGICAL SUPPLY — 25 items

## 2016-08-01 NOTE — Anesthesia Postprocedure Evaluation (Signed)
Anesthesia Post Note  Patient: Jon Anderson  Procedure(s) Performed: Procedure(s) (LRB): ESOPHAGOGASTRODUODENOSCOPY (EGD) WITH PROPOFOL (N/A) COLONOSCOPY WITH PROPOFOL (N/A)  Patient location during evaluation: PACU Anesthesia Type: MAC Level of consciousness: awake and alert Pain management: pain level controlled Vital Signs Assessment: post-procedure vital signs reviewed and stable Respiratory status: spontaneous breathing, nonlabored ventilation, respiratory function stable and patient connected to nasal cannula oxygen Cardiovascular status: stable and blood pressure returned to baseline Anesthetic complications: no       Last Vitals:  Vitals:   08/01/16 1320 08/01/16 1330  BP: (!) 148/66   Pulse:  78  Resp: (!) 21 (!) 21  Temp: 36.5 C     Last Pain:  Vitals:   08/01/16 1320  TempSrc: Oral                 Nehal Witting S

## 2016-08-01 NOTE — Op Note (Signed)
[facility] Patient Name: Jon Anderson Procedure Date: 08/01/2016 MRN: [id number] Attending MD: Carol Ada , MD Date of Birth: [birth year] CSN: [id number] Age: 66 Admit Type: Outpatient Procedure:                Colonoscopy Indications:              High risk colon cancer surveillance: Personal                            history of colonic polyps Providers:                Carol Ada, MD, Kingsley Plan, RN, Tinnie Gens,                            Technician, Cletis Athens, Technician Referring MD:              Medicines:                Propofol per Anesthesia Complications:            No immediate complications. Estimated Blood Loss:     Estimated blood loss was minimal. Procedure:                Pre-Anesthesia Assessment:                           - Prior to the procedure, a History and Physical                            was performed, and patient medications and                            allergies were reviewed. The patient's tolerance of                            previous anesthesia was also reviewed. The risks                            and benefits of the procedure and the sedation                            options and risks were discussed with the patient.                            All questions were answered, and informed consent                            was obtained. Prior Anticoagulants: The patient has                            taken no previous anticoagulant or antiplatelet                            agents. ASA Grade Assessment: III - A patient with  severe systemic disease. After reviewing the risks                            and benefits, the patient was deemed in                            satisfactory condition to undergo the procedure.                           - Sedation was administered by an anesthesia                            professional. Deep sedation was attained.                           After  obtaining informed consent, the colonoscope                            was passed under direct vision. Throughout the                            procedure, the patient's blood pressure, pulse, and                            oxygen saturations were monitored continuously. The                            EC-3890LI ([id number]) scope was introduced through                            the anus and advanced to the the cecum, identified                            by appendiceal orifice and ileocecal valve. The                            colonoscopy was performed without difficulty. The                            patient tolerated the procedure well. The quality                            of the bowel preparation was excellent. The                            ileocecal valve, appendiceal orifice, and rectum                            were photographed. Scope In: 07:37:10 PM Scope Out: 1:10:10 PM Scope Withdrawal Time: 0 hours 7 minutes 50 seconds  Total Procedure Duration: 0 hours 13 minutes 51 seconds  Findings:      Two sessile polyps were found in the transverse colon and cecum. The       polyps were 3 to 4 mm in size. These  polyps were removed with a cold       snare. Resection and retrieval were complete.      Scattered small and large-mouthed diverticula were found in the sigmoid       colon. Impression:               - Two 3 to 4 mm polyps in the transverse colon and                            in the cecum, removed with a cold snare. Resected                            and retrieved.                           - Diverticulosis in the sigmoid colon. Moderate Sedation:      N/A- Per Anesthesia Care Recommendation:           - Patient has a contact number available for                            emergencies. The signs and symptoms of potential                            delayed complications were discussed with the                            patient. Return to normal activities tomorrow.                             Written discharge instructions were provided to the                            patient.                           - Resume previous diet.                           - Continue present medications.                           - Await pathology results.                           - Repeat colonoscopy in 5 years for surveillance. Procedure Code(s):        --- Professional ---                           [phone], Colonoscopy, flexible; with removal of                            tumor(s), polyp(s), or other lesion(s) by snare                            technique Diagnosis Code(s):        --- Professional ---  D12.3, Benign neoplasm of transverse colon (hepatic                            flexure or splenic flexure)                           Z86.010, Personal history of colonic polyps                           D12.0, Benign neoplasm of cecum                           K57.30, Diverticulosis of large intestine without                            perforation or abscess without bleeding CPT copyright 2016 [employer]. All rights reserved. The codes documented in this report are preliminary and upon coder review may  be revised to meet current compliance requirements. Carol Ada, MD Carol Ada, MD 08/01/2016 1:16:03 PM This report has been signed electronically. Number of Addenda: 0

## 2016-08-01 NOTE — Op Note (Signed)
[facility] Patient Name: Jon Anderson Procedure Date: 08/01/2016 MRN: [id number] Attending MD: Carol Ada , MD Date of Birth: Dec 18, 1950 CSN: [id number] Age: 66 Admit Type: Outpatient Procedure:                Upper GI endoscopy Indications:              Dysphagia Providers:                Carol Ada, MD, Kingsley Plan, RN, Tinnie Gens,                            Technician, Cletis Athens, Technician Referring MD:              Medicines:                Propofol per Anesthesia Complications:            No immediate complications. Estimated Blood Loss:     Estimated blood loss was minimal. Procedure:                Pre-Anesthesia Assessment:                           - Prior to the procedure, a History and Physical                            was performed, and patient medications and                            allergies were reviewed. The patient's tolerance of                            previous anesthesia was also reviewed. The risks                            and benefits of the procedure and the sedation                            options and risks were discussed with the patient.                            All questions were answered, and informed consent                            was obtained. Prior Anticoagulants: The patient has                            taken no previous anticoagulant or antiplatelet                            agents. ASA Grade Assessment: III - A patient with                            severe systemic disease. After reviewing the risks  and benefits, the patient was deemed in                            satisfactory condition to undergo the procedure.                           - Sedation was administered by an anesthesia                            professional. Deep sedation was attained.                           After obtaining informed consent, the endoscope was                            passed under  direct vision. Throughout the                            procedure, the patient's blood pressure, pulse, and                            oxygen saturations were monitored continuously. The                            EC-3890LI ([id number]) scope was introduced through                            the mouth, and advanced to the second part of                            duodenum. The upper GI endoscopy was accomplished                            without difficulty. The patient tolerated the                            procedure well. Scope In: Scope Out: Findings:      One mild benign-appearing, intrinsic stenosis was found. This measured       1.5 cm (inner diameter) x less than one cm (in length) and was       traversed. A guidewire was placed and the scope was withdrawn. Dilation       was performed with a Savary dilator with no resistance at 16 mm.       Estimated blood loss was minimal. The expected smal mucosal tear was       identified s/p dilation.      A 3 cm hiatal hernia was present.      The stomach was normal.      Patchy moderately erythematous mucosa without active bleeding and with       no stigmata of bleeding was found in the duodenal bulb. Biopsies of the       gastric mucosa to evaluate for H. pylori with the duodenal inflammation       were note possible. The patient was coughing significantly after the       Savary dilation. Impression:               -  Benign-appearing esophageal stenosis. Dilated.                           - 3 cm hiatal hernia.                           - Normal stomach.                           - Erythematous duodenopathy.                           - No specimens collected. Moderate Sedation:      N/A- Per Anesthesia Care Recommendation:           - Patient has a contact number available for                            emergencies. The signs and symptoms of potential                            delayed complications were discussed with the                             patient. Return to normal activities tomorrow.                            Written discharge instructions were provided to the                            patient.                           - Resume previous diet.                           - Continue present medications.                           - Return to GI clinic in 4 weeks. Procedure Code(s):        --- Professional ---                           [id number], Esophagogastroduodenoscopy, flexible,                            transoral; with insertion of guide wire followed by                            passage of dilator(s) through esophagus over guide                            wire Diagnosis Code(s):        --- Professional ---                           K22.2, Esophageal obstruction  K44.9, Diaphragmatic hernia without obstruction or                            gangrene                           K31.89, Other diseases of stomach and duodenum                           R13.10, Dysphagia, unspecified CPT copyright 2016 American Medical Association. All rights reserved. The codes documented in this report are preliminary and upon coder review may  be revised to meet current compliance requirements. Carol Ada, MD Carol Ada, MD 08/01/2016 1:20:29 PM This report has been signed electronically. Number of Addenda: 0

## 2016-08-01 NOTE — Transfer of Care (Signed)
Immediate Anesthesia Transfer of Care Note  Patient: Jon Anderson  Procedure(s) Performed: Procedure(s): ESOPHAGOGASTRODUODENOSCOPY (EGD) WITH PROPOFOL (N/A) COLONOSCOPY WITH PROPOFOL (N/A)  Patient Location: PACU  Anesthesia Type:MAC  Level of Consciousness: awake, alert  and oriented  Airway & Oxygen Therapy: Patient Spontanous Breathing and Patient connected to nasal cannula oxygen  Post-op Assessment: Report given to RN and Post -op Vital signs reviewed and stable  Post vital signs: Reviewed and stable  Last Vitals:  Vitals:   08/01/16 1235  BP: (!) 185/74  Pulse: 76  Resp: 15  Temp: 36.8 C    Last Pain:  Vitals:   08/01/16 1235  TempSrc: Oral         Complications: No apparent anesthesia complications

## 2016-08-01 NOTE — Anesthesia Preprocedure Evaluation (Addendum)
Anesthesia Evaluation  Patient identified by MRN, date of birth, ID band Patient awake    Reviewed: Allergy & Precautions, NPO status , Patient's Chart, lab work & pertinent test results  Airway Mallampati: II  TM Distance: >3 FB Neck ROM: Full    Dental no notable dental hx.    Pulmonary neg pulmonary ROS, former smoker,    Pulmonary exam normal breath sounds clear to auscultation       Cardiovascular hypertension, + Peripheral Vascular Disease  Normal cardiovascular exam Rhythm:Regular Rate:Normal     Neuro/Psych negative neurological ROS  negative psych ROS   GI/Hepatic negative GI ROS, Neg liver ROS,   Endo/Other  diabetes  Renal/GU DialysisRenal disease  negative genitourinary   Musculoskeletal negative musculoskeletal ROS (+)   Abdominal   Peds negative pediatric ROS (+)  Hematology negative hematology ROS (+)   Anesthesia Other Findings   Reproductive/Obstetrics negative OB ROS                             Anesthesia Physical Anesthesia Plan  ASA: III  Anesthesia Plan: MAC   Post-op Pain Management:    Induction: Intravenous  Airway Management Planned: Nasal Cannula  Additional Equipment:   Intra-op Plan:   Post-operative Plan:   Informed Consent: I have reviewed the patients History and Physical, chart, labs and discussed the procedure including the risks, benefits and alternatives for the proposed anesthesia with the patient or authorized representative who has indicated his/her understanding and acceptance.   Dental advisory given  Plan Discussed with: CRNA and Surgeon  Anesthesia Plan Comments:         Anesthesia Quick Evaluation

## 2016-08-01 NOTE — H&P (Signed)
Jon Anderson HPI: At this time the patient denies any problems with nausea, vomiting, fevers, chills, abdominal pain, diarrhea, constipation, hematochezia, melena, or GERD. The patient denies any known family history of colon cancers. No complaints of chest pain, SOB, MI, or sleep apnea. In 2010 the patient was evaluated in the office for a CRC and also an EGD for complaints of dysphagia. He cancelled the appointment twice. He believe he went to [facility] and underwent an EGD with dilation, which helped temporarily. Solid food intake causes him problems intermittently 3 times per week.  Past Medical History:  Diagnosis Date  . Anemia of chronic disease   . CAD (coronary artery disease)    a.  Myoview 4/11: EF 53%, no scar or ischemia   c. MV 2012 Nl perfusion, apical thinning.  No ischemia or scar.  EF 49%, appears greater by visual estimate.;  d.  Dob stress echo 12/13:  Negative Dob stress echo. There is no evidence of ischemia.  The LVF is normal. b. Normal cors 2016.  . Carotid stenosis    a. <00% RICA, >92% LICA by duplex 06/3005  . Chronic chest pain    occ  . ESRD (end stage renal disease) on dialysis ([place])   . GERD (gastroesophageal reflux disease)   . HTN (hypertension)    echo 3/10: EF 60%, LAE  . Hyperlipidemia   . Nephrolithiasis    "passed them all"  . Peripheral arterial disease ([place])    a. s/p PTCA to L SFA.  Marland Kitchen Pneumonia   . Restless legs   . Snores    a. presumed OSA, pt has refused sleep eval in past.  . Type II diabetes mellitus ([place])     Past Surgical History:  Procedure Laterality Date  . ANGIOPLASTY / STENTING FEMORAL Left 12/11/2013   dr berry  . AV FISTULA PLACEMENT Left 03/19/2014   Procedure: CREATION OF ARTERIOVENOUS (AV) FISTULA  LEFT UPPER ARM;  Surgeon: Mal Misty, MD;  Location: [place];  Service: Vascular;  Laterality: Left;  . CARDIAC CATHETERIZATION  2001 and 2010   . COLONOSCOPY W/ BIOPSIES AND POLYPECTOMY    . FOOT FRACTURE  SURGERY Right   . FRACTURE SURGERY    . INGUINAL HERNIA REPAIR Left   . LEFT HEART CATHETERIZATION WITH CORONARY ANGIOGRAM N/A 06/22/2014   Procedure: LEFT HEART CATHETERIZATION WITH CORONARY ANGIOGRAM;  Surgeon: Troy Sine, MD;  [facility] C[facility] LAB;  Service: Cardiovascular;  Laterality: N/A;  . LOWER EXTREMITY ANGIOGRAM Left 12/11/2013   Procedure: LOWER EXTREMITY ANGIOGRAM;  Surgeon: Lorretta Harp, MD;  Location: [facility] CATH LAB;  Service: Cardiovascular;  Laterality: Left;  . TONSILLECTOMY AND ADENOIDECTOMY      Family History  Problem Relation Age of Onset  . Heart attack Sister     died @ 60  . Cancer Mother     died @ 41; unknown type  . Diabetes Brother     deceased  . Cirrhosis Father     alcohol related  . Diabetes Father   . Esophageal cancer Neg Hx   . Colon cancer Neg Hx   . Pancreatic cancer Neg Hx   . Stomach cancer Neg Hx     Social History:  reports that he has quit smoking. His smoking use included Cigarettes. He has a 2.00 pack-year smoking history. He has never used smokeless tobacco. He reports that he does not drink alcohol or use drugs.  Allergies:  Allergies  Allergen Reactions  .  Kiwi Extract Swelling    Facial swelling   . Tape Other (See Comments)    Plastic tape-blistering    Medications: Scheduled: Continuous:  No results found for this or any previous visit (from the past 24 hour(s)).   No results found.  ROS:  As stated above in the HPI otherwise negative.  There were no vitals taken for this visit.    PE: Gen: NAD, Alert and Oriented HEENT:  [place]/AT, EOMI Neck: Supple, no LAD Lungs: CTA Bilaterally CV: RRR without M/G/R ABM: Soft, NTND, +BS Ext: No C/C/E  Assessment/Plan: 1) Dysphagia. 2) Screening colonoscopy.  Plan: 1) EGD with dilation and colonoscopy.  Latera Mclin D 08/01/2016, 11:05 AM

## 2016-08-01 NOTE — Discharge Instructions (Signed)

## 2016-08-02 LAB — POCT I-STAT 4, (NA,K, GLUC, HGB,HCT)
Glucose, Bld: 69 mg/dL (ref 65–99)
HCT: 38 % — ABNORMAL LOW (ref 39.0–52.0)
Hemoglobin: 12.9 g/dL — ABNORMAL LOW (ref 13.0–17.0)
Potassium: 4.1 mmol/L (ref 3.5–5.1)
Sodium: 139 mmol/L (ref 135–145)

## 2016-08-03 ENCOUNTER — Encounter (HOSPITAL_COMMUNITY): Payer: Self-pay | Admitting: Gastroenterology

## 2016-08-16 ENCOUNTER — Encounter (HOSPITAL_COMMUNITY): Payer: Self-pay | Admitting: Emergency Medicine

## 2016-08-16 ENCOUNTER — Emergency Department (HOSPITAL_COMMUNITY)
Admission: EM | Admit: 2016-08-16 | Discharge: 2016-08-16 | Disposition: A | Discharge status: Home / Self Care | Payer: BC Managed Care – PPO | Attending: Emergency Medicine | Admitting: Emergency Medicine

## 2016-08-16 ENCOUNTER — Emergency Department (HOSPITAL_COMMUNITY): Payer: BC Managed Care – PPO

## 2016-08-16 DIAGNOSIS — Z87891 Personal history of nicotine dependence: Secondary | ICD-10-CM | POA: Diagnosis not present

## 2016-08-16 DIAGNOSIS — E1122 Type 2 diabetes mellitus with diabetic chronic kidney disease: Secondary | ICD-10-CM | POA: Diagnosis not present

## 2016-08-16 DIAGNOSIS — G2581 Restless legs syndrome: Secondary | ICD-10-CM

## 2016-08-16 DIAGNOSIS — Z79899 Other long term (current) drug therapy: Secondary | ICD-10-CM | POA: Diagnosis not present

## 2016-08-16 DIAGNOSIS — Z7902 Long term (current) use of antithrombotics/antiplatelets: Secondary | ICD-10-CM | POA: Diagnosis not present

## 2016-08-16 DIAGNOSIS — N186 End stage renal disease: Secondary | ICD-10-CM | POA: Insufficient documentation

## 2016-08-16 DIAGNOSIS — I2510 Atherosclerotic heart disease of native coronary artery without angina pectoris: Secondary | ICD-10-CM | POA: Insufficient documentation

## 2016-08-16 DIAGNOSIS — I120 Hypertensive chronic kidney disease with stage 5 chronic kidney disease or end stage renal disease: Secondary | ICD-10-CM | POA: Diagnosis not present

## 2016-08-16 DIAGNOSIS — R079 Chest pain, unspecified: Secondary | ICD-10-CM | POA: Diagnosis present

## 2016-08-16 DIAGNOSIS — Z7982 Long term (current) use of aspirin: Secondary | ICD-10-CM | POA: Insufficient documentation

## 2016-08-16 DIAGNOSIS — Z992 Dependence on renal dialysis: Secondary | ICD-10-CM | POA: Insufficient documentation

## 2016-08-16 DIAGNOSIS — R0789 Other chest pain: Secondary | ICD-10-CM

## 2016-08-16 LAB — COMPREHENSIVE METABOLIC PANEL
ALT: 15 U/L — ABNORMAL LOW (ref 17–63)
AST: 13 U/L — ABNORMAL LOW (ref 15–41)
Albumin: 3.9 g/dL (ref 3.5–5.0)
Alkaline Phosphatase: 52 U/L (ref 38–126)
Anion gap: 16 — ABNORMAL HIGH (ref 5–15)
BUN: 45 mg/dL — ABNORMAL HIGH (ref 6–20)
CO2: 25 mmol/L (ref 22–32)
Calcium: 9.3 mg/dL (ref 8.9–10.3)
Chloride: 94 mmol/L — ABNORMAL LOW (ref 101–111)
Creatinine, Ser: 12.49 mg/dL — ABNORMAL HIGH (ref 0.61–1.24)
GFR calc Af Amer: 4 mL/min — ABNORMAL LOW (ref >60)
GFR calc non Af Amer: 4 mL/min — ABNORMAL LOW (ref >60)
Glucose, Bld: 86 mg/dL (ref 65–99)
Potassium: 4.3 mmol/L (ref 3.5–5.1)
Sodium: 135 mmol/L (ref 135–145)
Total Bilirubin: 0.5 mg/dL (ref 0.3–1.2)
Total Protein: 7.1 g/dL (ref 6.5–8.1)

## 2016-08-16 LAB — CBC WITH DIFFERENTIAL/PLATELET
Basophils Absolute: 0 10*3/uL (ref 0.0–0.1)
Basophils Relative: 0 %
Eosinophils Absolute: 0.3 10*3/uL (ref 0.0–0.7)
Eosinophils Relative: 4 %
HCT: 36.4 % — ABNORMAL LOW (ref 39.0–52.0)
Hemoglobin: 11.9 g/dL — ABNORMAL LOW (ref 13.0–17.0)
Lymphocytes Relative: 46 %
Lymphs Abs: 2.9 10*3/uL (ref 0.7–4.0)
MCH: 29.3 pg (ref 26.0–34.0)
MCHC: 32.7 g/dL (ref 30.0–36.0)
MCV: 89.7 fL (ref 78.0–100.0)
Monocytes Absolute: 0.6 10*3/uL (ref 0.1–1.0)
Monocytes Relative: 9 %
Neutro Abs: 2.6 10*3/uL (ref 1.7–7.7)
Neutrophils Relative %: 41 %
Platelets: 227 10*3/uL (ref 150–400)
RBC: 4.06 MIL/uL — ABNORMAL LOW (ref 4.22–5.81)
RDW: 13.8 % (ref 11.5–15.5)
WBC: 6.4 10*3/uL (ref 4.0–10.5)

## 2016-08-16 LAB — CBG MONITORING, ED: Glucose-Capillary: 88 mg/dL (ref 65–99)

## 2016-08-16 LAB — TROPONIN I: Troponin I: 0.04 ng/mL (ref <0.03)

## 2016-08-16 MED ORDER — ACETAMINOPHEN 500 MG PO TABS
1000.0000 mg | ORAL_TABLET | Freq: Once | ORAL | Status: AC
  Administered 2016-08-16: 1000 mg via ORAL
  Filled 2016-08-16: qty 2

## 2016-08-16 MED ORDER — LORAZEPAM 1 MG PO TABS
1.0000 mg | ORAL_TABLET | Freq: Once | ORAL | Status: AC
  Administered 2016-08-16: 1 mg via ORAL
  Filled 2016-08-16: qty 1

## 2016-08-16 NOTE — ED Provider Notes (Signed)
[facility] DEPT Provider Note   CSN: [id number] Arrival date & time: 08/16/16  0447     History   Chief Complaint Chief Complaint  Patient presents with  . Chest Pain  . Leg Pain    HPI Jon Anderson is a 66 y.o. male.  HPI  This is a 66 year old male with history of end-stage renal disease, hypertension, hyperlipidemia, restless leg syndrome who presents with multiple complaints. Patient reports he has had ongoing constant chest pain since Monday. It is nonexertional. It is worse when he lays flat. He states that it is pressure-like and nonradiating. It is currently 8 out of 10. He is not taking anything for the pain. Denies any fevers or shortness of breath. Patient also states that his restless legs and diabetic neuropathy are "acting up." He is unable to sleep at night.  He did take his gabapentin with minimal relief. He dialyzes on Monday, Wednesday, and Friday. Last dialysis was on Wednesday. He is due to dialyze at 6:30 this morning.  Past Medical History:  Diagnosis Date  . Anemia of chronic disease   . CAD (coronary artery disease)    a.  Myoview 4/11: EF 53%, no scar or ischemia   c. MV 2012 Nl perfusion, apical thinning.  No ischemia or scar.  EF 49%, appears greater by visual estimate.;  d.  Dob stress echo 12/13:  Negative Dob stress echo. There is no evidence of ischemia.  The LVF is normal. b. Normal cors 2016.  . Carotid stenosis    a. <42% RICA, >59% LICA by duplex 08/6385  . Chronic chest pain    occ  . ESRD (end stage renal disease) on dialysis ([place])   . GERD (gastroesophageal reflux disease)   . HTN (hypertension)    echo 3/10: EF 60%, LAE  . Hyperlipidemia   . Nephrolithiasis    "passed them all"  . Peripheral arterial disease ([place])    a. s/p PTCA to L SFA.  Marland Kitchen Pneumonia   . Restless legs   . Snores    a. presumed OSA, pt has refused sleep eval in past.  . Type II diabetes mellitus [facility])     Patient Active Problem List   Diagnosis Date Noted   . Chest pain, non-cardiac 10/09/2015  . Acute on chronic renal failure ([place]) 06/16/2014  . Renal failure (ARF), acute on chronic (HCC) 06/16/2014  . Shoulder pain, left 12/15/2013  . Chest pain 12/15/2013  . Claudication ([place]) 12/11/2013  . PVD (peripheral vascular disease) ([place]) 12/11/2013  . Peripheral arterial disease ([place]) 09/30/2013  . Carotid artery disease ([place]) 09/30/2013  . Acute chest pain 11/15/2012  . Left-sided chest wall pain 03/19/2012  . Bruit 09/15/2010  . CAD (coronary artery disease) nonobstructive per cath 2012   . Chronic kidney disease (CKD), stage IV (severe) ([place])   . CHEST PAIN, PRECORDIAL 07/26/2009  . DM (diabetes mellitus) ([place]) 07/22/2009  . Hyperlipidemia LDL goal <70 07/22/2009  . Hypertension 07/22/2009    Past Surgical History:  Procedure Laterality Date  . ANGIOPLASTY / STENTING FEMORAL Left 12/11/2013   dr berry  . AV FISTULA PLACEMENT Left 03/19/2014   Procedure: CREATION OF ARTERIOVENOUS (AV) FISTULA  LEFT UPPER ARM;  Surgeon: Mal Misty, MD;  Location: [facility];  Service: Vascular;  Laterality: Left;  . CARDIAC CATHETERIZATION  2001 and 2010   . COLONOSCOPY W/ BIOPSIES AND POLYPECTOMY    . COLONOSCOPY WITH PROPOFOL N/A 08/01/2016   Procedure: COLONOSCOPY WITH PROPOFOL;  Surgeon: Carol Ada, MD;  Location: [facility];  Service: Endoscopy;  Laterality: N/A;  . ESOPHAGOGASTRODUODENOSCOPY (EGD) WITH PROPOFOL N/A 08/01/2016   Procedure: ESOPHAGOGASTRODUODENOSCOPY (EGD) WITH PROPOFOL;  Surgeon: Carol Ada, MD;  Location: [facility] ENDOSCOPY;  Service: Endoscopy;  Laterality: N/A;  . FOOT FRACTURE SURGERY Right   . FRACTURE SURGERY    . INGUINAL HERNIA REPAIR Left   . LEFT HEART CATHETERIZATION WITH CORONARY ANGIOGRAM N/A 06/22/2014   Procedure: LEFT HEART CATHETERIZATION WITH CORONARY ANGIOGRAM;  Surgeon: Troy Sine, MD;  Location: [facility] CATH LAB;  Service: Cardiovascular;  Laterality: N/A;  . LOWER EXTREMITY ANGIOGRAM Left 12/11/2013   Procedure:  LOWER EXTREMITY ANGIOGRAM;  Surgeon: Lorretta Harp, MD;  Location: [facility] CATH LAB;  Service: Cardiovascular;  Laterality: Left;  . TONSILLECTOMY AND ADENOIDECTOMY         Home Medications    Prior to Admission medications   Medication Sig Start Date End Date Taking? Authorizing Provider  aspirin EC 81 MG tablet Take 1 tablet (81 mg total) by mouth daily. 05/28/12  Yes Weaver, Scott T, PA-C  atorvastatin (LIPITOR) 40 MG tablet Take 1 tablet (40 mg total) by mouth daily at 6 PM. 12/13/13  Yes Brett Canales, PA-C  calcium acetate (PHOSLO) 667 MG capsule Take 3 capsules (2,001 mg total) by mouth 3 (three) times daily with meals. Patient taking differently: Take 2,001 mg by mouth 3 (three) times daily with meals. May take 667 -1334 mg with snacks 10/09/15  Yes Short, Noah Delaine, MD  cinacalcet (SENSIPAR) 60 MG tablet Take 120 mg by mouth every Monday, Wednesday, and Friday. At dialysis   Yes [provider]  clonazePAM (KLONOPIN) 0.5 MG tablet TAKE 1 TABLET BY MOUTH AT BEDTIME 09/22/14  Yes [provider]  clopidogrel (PLAVIX) 75 MG tablet Take 1 tablet (75 mg total) by mouth daily with breakfast. 12/13/13  Yes Brett Canales, PA-C  gabapentin (NEURONTIN) 100 MG capsule Take 100-200 mg by mouth See admin instructions. Take 1 capsule every morning and take 2 capsules every night 10/02/14  Yes [provider]  lisinopril (PRINIVIL,ZESTRIL) 20 MG tablet Take 20 mg by mouth at bedtime.   Yes [provider]  multivitamin (RENA-VIT) TABS tablet Take 1 tablet by mouth at bedtime. 06/23/14  Yes Domenic Polite, MD  nitroGLYCERIN (NITROSTAT) 0.4 MG SL tablet Place 1 tablet (0.4 mg total) under the tongue every 5 (five) minutes as needed for chest pain. 03/20/12  Yes Barrett, Evelene Croon, PA-C  NOVOLOG 100 UNIT/ML injection 0.75 units per hour via insulin pump per pt - - USE AS DIRECTED (TRITRATE AS NEEDED) *MAX 50 UNITS A DAY* 08/28/14  Yes [provider]  pantoprazole  (PROTONIX) 40 MG tablet Take 40 mg by mouth daily.   Yes [provider]  rOPINIRole (REQUIP) 0.5 MG tablet Take 0.5 mg by mouth 3 (three) times daily.    Yes [provider]  ondansetron (ZOFRAN) 4 MG tablet Take 1 tablet (4 mg total) by mouth every 6 (six) hours as needed for nausea or vomiting. Patient not taking: Reported on 08/16/2016 06/14/16   Forde Dandy, MD    Family History Family History  Problem Relation Age of Onset  . Heart attack Sister     died @ 27  . Cancer Mother     died @ 45; unknown type  . Diabetes Brother     deceased  . Cirrhosis Father     alcohol related  . Diabetes Father   .  Esophageal cancer Neg Hx   . Colon cancer Neg Hx   . Pancreatic cancer Neg Hx   . Stomach cancer Neg Hx     Social History Social History  Substance Use Topics  . Smoking status: Former Smoker    Packs/day: 1.00    Years: 2.00    Types: Cigarettes  . Smokeless tobacco: Never Used     Comment: quit smoking 40 yrs ago  . Alcohol use No     Allergies   Kiwi extract and Tape   Review of Systems Review of Systems  Constitutional: Negative for fever.  Respiratory: Negative for shortness of breath.   Cardiovascular: Positive for chest pain. Negative for leg swelling.  Gastrointestinal: Negative for abdominal pain, nausea and vomiting.  Musculoskeletal:       Slight pain, pins and needles in the feet  Skin: Negative for wound.  All other systems reviewed and are negative.    Physical Exam Updated Vital Signs BP (!) 194/111   Pulse 81   Temp 97.9 F (36.6 C) (Oral)   Resp 19   SpO2 95%   Physical Exam  Constitutional: He is oriented to person, place, and time. No distress.  Chronically ill-appearing, no acute distress  HENT:  Head: Normocephalic and atraumatic.  Cardiovascular: Normal rate, regular rhythm and normal heart sounds.   No murmur heard. Pulmonary/Chest: Effort normal and breath sounds normal. No respiratory distress. He has no  wheezes.  Abdominal: Soft. Bowel sounds are normal. There is no tenderness. There is no rebound.  Musculoskeletal: He exhibits edema.  Trace bilateral lower extremity edema  Neurological: He is alert and oriented to person, place, and time.  Skin: Skin is warm and dry.  Psychiatric: He has a normal mood and affect.  Nursing note and vitals reviewed.    ED Treatments / Results  Labs (all labs ordered are listed, but only abnormal results are displayed) Labs Reviewed  CBC WITH DIFFERENTIAL/PLATELET - Abnormal; Notable for the following:       Result Value   RBC 4.06 (*)    Hemoglobin 11.9 (*)    HCT 36.4 (*)    All other components within normal limits  COMPREHENSIVE METABOLIC PANEL - Abnormal; Notable for the following:    Chloride 94 (*)    BUN 45 (*)    Creatinine, Ser 12.49 (*)    AST 13 (*)    ALT 15 (*)    GFR calc non Af Amer 4 (*)    GFR calc Af Amer 4 (*)    Anion gap 16 (*)    All other components within normal limits  TROPONIN I - Abnormal; Notable for the following:    Troponin I 0.04 (*)    All other components within normal limits  CBG MONITORING, ED    EKG  EKG Interpretation  Date/Time:  Wednesday Aug 16 2016 04:54:54 EDT Ventricular Rate:  75 PR Interval:    QRS Duration: 86 QT Interval:  382 QTC Calculation: 427 R Axis:   37 Text Interpretation:  Sinus rhythm Probable anteroseptal infarct, recent No significant change since last tracing Confirmed by Thayer Jew [phone]) on 08/16/2016 4:58:36 AM       Radiology Dg Chest 2 View  Result Date: 08/16/2016 CLINICAL DATA:  Chest pain and leg pain. History of restless leg syndrome and neuropathy. End-stage renal disease. EXAM: CHEST  2 VIEW COMPARISON:  06/14/2016 FINDINGS: Normal heart size and pulmonary vascularity. No focal airspace disease or consolidation in the  lungs. No blunting of costophrenic angles. No pneumothorax. Mediastinal contours appear intact. Calcification and torsion of the aorta.  IMPRESSION: No active cardiopulmonary disease. Electronically Signed   By: Lucienne Capers M.D.   On: 08/16/2016 05:46    Procedures Procedures (including critical care time)  Medications Ordered in ED Medications  acetaminophen (TYLENOL) tablet 1,000 mg (1,000 mg Oral Given 08/16/16 0616)  LORazepam (ATIVAN) tablet 1 mg (1 mg Oral Given 08/16/16 1610)     Initial Impression / Assessment and Plan / ED Course  I have reviewed the triage vital signs and the nursing notes.  Pertinent labs & imaging results that were available during my care of the patient were reviewed by me and considered in my medical decision making (see chart for details).     Patient presents with chest pain. History of chronic atypical chest pain symptoms. Last seen in the ED in early March. Last cardiac catheterization was 2 years ago with normal coronaries. He has had ongoing pain for the last 2 days. He does not appear volume overloaded. He denies any infectious symptoms. He has no asymmetric swelling of the lower extremities and have low suspicion at this time for PE. Workup initiated. EKG is unchanged from prior. No acute ischemia noted.  Lab work is at baseline. Potassium is 4.3. Troponin is 0.04 which is within his prior known troponins. Given that he has had 3 days of chest pain and it is atypical, feel he does not need any further ischemic workup at this time. Patient did report improvement with Ativan and Tylenol. Will discharge to dialysis.  After history, exam, and medical workup I feel the patient has been appropriately medically screened and is safe for discharge home. Pertinent diagnoses were discussed with the patient. Patient was given return precautions.   Final Clinical Impressions(s) / ED Diagnoses   Final diagnoses:  Atypical chest pain  Restless leg    New Prescriptions Discharge Medication List as of 08/16/2016  7:24 AM       Dina Rich, Barbette Hair, MD 08/16/16 [id number]

## 2016-08-16 NOTE — Discharge Instructions (Signed)
You were seen today for chest pain and restless leg syndrome. Your workup is reassuring. You need to go straight to dialysis after being discharged today. Follow-up with your primary doctor.

## 2016-08-16 NOTE — ED Triage Notes (Signed)
Patient arrives from home with complaint of chest pain and leg pain. States history of restless leg syndrome and neuropathy. Chest pain is made worse by laying down and exertion. ESRD; MWF schedule; last dialysis was 08/14/2016; reports normal session without complication.

## 2016-08-22 ENCOUNTER — Ambulatory Visit (INDEPENDENT_AMBULATORY_CARE_PROVIDER_SITE_OTHER): Payer: Medicare Other | Admitting: Podiatry

## 2016-08-22 DIAGNOSIS — M79676 Pain in unspecified toe(s): Secondary | ICD-10-CM | POA: Diagnosis not present

## 2016-08-22 DIAGNOSIS — B351 Tinea unguium: Secondary | ICD-10-CM

## 2016-08-22 DIAGNOSIS — Q828 Other specified congenital malformations of skin: Secondary | ICD-10-CM

## 2016-08-22 DIAGNOSIS — E1142 Type 2 diabetes mellitus with diabetic polyneuropathy: Secondary | ICD-10-CM

## 2016-08-22 NOTE — Progress Notes (Signed)
He presents today chief complaint of painful elongated toenails 1 through 5 bilateral.  Objective: Vital signs are stable alert and oriented 3. Pulses are palpable. Neurologic sensorium is intact. Reflexes are intact. Tenderness along the Keller dystrophic onychomycotic. Multiple reactive hyperkeratosis plantar aspect of the bilateral foot no open lesions or wounds.  Assessment: Pain limb secondary to onychomycosis. Painful porokeratosis bilateral. History of diabetes and kidney failure.  Plan: Debridement of toenails 1 through 5 bilateral. 3. Reactive hyperkeratosis.

## 2016-08-29 ENCOUNTER — Other Ambulatory Visit (HOSPITAL_BASED_OUTPATIENT_CLINIC_OR_DEPARTMENT_OTHER): Payer: Self-pay

## 2016-08-29 DIAGNOSIS — G4730 Sleep apnea, unspecified: Secondary | ICD-10-CM

## 2016-08-29 DIAGNOSIS — R0683 Snoring: Secondary | ICD-10-CM

## 2016-08-29 DIAGNOSIS — R5383 Other fatigue: Secondary | ICD-10-CM

## 2016-08-29 DIAGNOSIS — G2581 Restless legs syndrome: Secondary | ICD-10-CM

## 2016-09-11 NOTE — Anesthesia Postprocedure Evaluation (Signed)
Anesthesia Post Note  Patient: Jon Anderson  Procedure(s) Performed: Procedure(s) (LRB): ESOPHAGOGASTRODUODENOSCOPY (EGD) WITH PROPOFOL (N/A) COLONOSCOPY WITH PROPOFOL (N/A)     Anesthesia Post Evaluation  Last Vitals:  Vitals:   08/01/16 1320 08/01/16 1330  BP: (!) 148/66   Pulse:  78  Resp: (!) 21 (!) 21  Temp: 36.5 C     Last Pain:  Vitals:   08/02/16 1326  TempSrc:   PainSc: 2                  Jamy Cleckler S

## 2016-09-11 NOTE — Addendum Note (Signed)
Addendum  created 09/11/16 1343 by Myrtie Soman, MD   Sign clinical note

## 2016-10-04 ENCOUNTER — Encounter (HOSPITAL_COMMUNITY): Payer: Self-pay

## 2016-10-04 ENCOUNTER — Emergency Department (HOSPITAL_COMMUNITY): Payer: BC Managed Care – PPO

## 2016-10-04 ENCOUNTER — Emergency Department (HOSPITAL_COMMUNITY)
Admission: EM | Admit: 2016-10-04 | Discharge: 2016-10-04 | Disposition: A | Discharge status: Home / Self Care | Payer: BC Managed Care – PPO | Attending: Emergency Medicine | Admitting: Emergency Medicine

## 2016-10-04 DIAGNOSIS — Z7902 Long term (current) use of antithrombotics/antiplatelets: Secondary | ICD-10-CM | POA: Insufficient documentation

## 2016-10-04 DIAGNOSIS — Z992 Dependence on renal dialysis: Secondary | ICD-10-CM | POA: Diagnosis not present

## 2016-10-04 DIAGNOSIS — Z87891 Personal history of nicotine dependence: Secondary | ICD-10-CM | POA: Insufficient documentation

## 2016-10-04 DIAGNOSIS — R079 Chest pain, unspecified: Secondary | ICD-10-CM | POA: Diagnosis present

## 2016-10-04 DIAGNOSIS — Z79899 Other long term (current) drug therapy: Secondary | ICD-10-CM | POA: Diagnosis not present

## 2016-10-04 DIAGNOSIS — I2510 Atherosclerotic heart disease of native coronary artery without angina pectoris: Secondary | ICD-10-CM | POA: Insufficient documentation

## 2016-10-04 DIAGNOSIS — I120 Hypertensive chronic kidney disease with stage 5 chronic kidney disease or end stage renal disease: Secondary | ICD-10-CM | POA: Diagnosis not present

## 2016-10-04 DIAGNOSIS — N186 End stage renal disease: Secondary | ICD-10-CM | POA: Diagnosis not present

## 2016-10-04 DIAGNOSIS — Z794 Long term (current) use of insulin: Secondary | ICD-10-CM | POA: Diagnosis not present

## 2016-10-04 DIAGNOSIS — Z7982 Long term (current) use of aspirin: Secondary | ICD-10-CM | POA: Insufficient documentation

## 2016-10-04 DIAGNOSIS — E1122 Type 2 diabetes mellitus with diabetic chronic kidney disease: Secondary | ICD-10-CM | POA: Diagnosis not present

## 2016-10-04 DIAGNOSIS — R0789 Other chest pain: Secondary | ICD-10-CM | POA: Diagnosis not present

## 2016-10-04 LAB — BASIC METABOLIC PANEL
Anion gap: 10 (ref 5–15)
BUN: 24 mg/dL — ABNORMAL HIGH (ref 6–20)
CO2: 29 mmol/L (ref 22–32)
Calcium: 8.7 mg/dL — ABNORMAL LOW (ref 8.9–10.3)
Chloride: 95 mmol/L — ABNORMAL LOW (ref 101–111)
Creatinine, Ser: 7.24 mg/dL — ABNORMAL HIGH (ref 0.61–1.24)
GFR calc Af Amer: 8 mL/min — ABNORMAL LOW (ref >60)
GFR calc non Af Amer: 7 mL/min — ABNORMAL LOW (ref >60)
Glucose, Bld: 140 mg/dL — ABNORMAL HIGH (ref 65–99)
Potassium: 3.4 mmol/L — ABNORMAL LOW (ref 3.5–5.1)
Sodium: 134 mmol/L — ABNORMAL LOW (ref 135–145)

## 2016-10-04 LAB — TROPONIN I: Troponin I: 0.04 ng/mL (ref <0.03)

## 2016-10-04 LAB — CBC
HCT: 29.9 % — ABNORMAL LOW (ref 39.0–52.0)
Hemoglobin: 9.7 g/dL — ABNORMAL LOW (ref 13.0–17.0)
MCH: 28.7 pg (ref 26.0–34.0)
MCHC: 32.4 g/dL (ref 30.0–36.0)
MCV: 88.5 fL (ref 78.0–100.0)
Platelets: 207 10*3/uL (ref 150–400)
RBC: 3.38 MIL/uL — ABNORMAL LOW (ref 4.22–5.81)
RDW: 14.1 % (ref 11.5–15.5)
WBC: 5.8 10*3/uL (ref 4.0–10.5)

## 2016-10-04 LAB — CBG MONITORING, ED: Glucose-Capillary: 145 mg/dL — ABNORMAL HIGH (ref 65–99)

## 2016-10-04 MED ORDER — ONDANSETRON HCL 4 MG/2ML IJ SOLN
4.0000 mg | Freq: Once | INTRAMUSCULAR | Status: AC
  Administered 2016-10-04: 4 mg via INTRAVENOUS
  Filled 2016-10-04: qty 2

## 2016-10-04 MED ORDER — ASPIRIN 81 MG PO CHEW: 324.0000 mg | CHEWABLE_TABLET | Freq: Once | ORAL | Status: DC

## 2016-10-04 MED ORDER — MORPHINE SULFATE (PF) 4 MG/ML IV SOLN
4.0000 mg | Freq: Once | INTRAVENOUS | Status: AC
  Administered 2016-10-04: 4 mg via INTRAVENOUS
  Filled 2016-10-04: qty 1

## 2016-10-04 NOTE — ED Triage Notes (Signed)
Pt brought in by EMS due to having chest pain that started during HD. Pt had 3 hours of HD today. Pt received 324mg  of aspirin and nitrox2 and pain was unrelieved. VSS. Pt a&ox4.

## 2016-10-04 NOTE — ED Notes (Signed)
Placed patient on the monitor did vitals and ekg shown to Dr Gilford Raid patient is resting waiting on provider

## 2016-10-04 NOTE — ED Notes (Signed)
CRITICAL VALUE ALERT  Critical Value:  Troponin  Date & Time Notied:  10/04/16   11:02  Provider Notified: Dr. Gilford Raid  Orders Received/Actions taken: No new orders received

## 2016-10-04 NOTE — ED Notes (Signed)
Patient transported to X-ray 

## 2016-10-04 NOTE — ED Provider Notes (Signed)
[facility] DEPT Provider Note   CSN: [id number] Arrival date & time: 10/04/16  0935     History   Chief Complaint Chief Complaint  Patient presents with  . Chest Pain    HPI Jon Anderson is a 66 y.o. male.  Pt presents to the ED today with chest pain.  He is an ESRD pt with HD and was getting dialysis today when he developed cp.  The pt had about 3 hours of his dialysis today.   The pt has had multiple visits for chest pain in the past with no hx of CAD.  He had a cath in 2016 that was normal per cardiology clinic notes.  Pt is getting evaluated by cardiology at [facility] as a pre renal transplant by Dr. Kennith Center.  He had a recent stress on 08/24/16 that was normal.  He has been cleared for surgery.  Pt was given asa and nitro by EMS which did not help his pain.  He denies any sob.  No asymmetric swelling in his legs.        Past Medical History:  Diagnosis Date  . Anemia of chronic disease   . CAD (coronary artery disease)    a.  Myoview 4/11: EF 53%, no scar or ischemia   c. MV 2012 Nl perfusion, apical thinning.  No ischemia or scar.  EF 49%, appears greater by visual estimate.;  d.  Dob stress echo 12/13:  Negative Dob stress echo. There is no evidence of ischemia.  The LVF is normal. b. Normal cors 2016.  . Carotid stenosis    a. <62% RICA, >69% LICA by duplex 07/8544  . Chronic chest pain    occ  . ESRD (end stage renal disease) on dialysis ([place])   . GERD (gastroesophageal reflux disease)   . HTN (hypertension)    echo 3/10: EF 60%, LAE  . Hyperlipidemia   . Nephrolithiasis    "passed them all"  . Peripheral arterial disease ([place])    a. s/p PTCA to L SFA.  Marland Kitchen Pneumonia   . Restless legs   . Snores    a. presumed OSA, pt has refused sleep eval in past.  . Type II diabetes mellitus [facility])     Patient Active Problem List   Diagnosis Date Noted  . Chest pain, non-cardiac 10/09/2015  . Acute on chronic renal failure ([place]) 06/16/2014  . Renal failure (ARF), acute  on chronic (HCC) 06/16/2014  . Shoulder pain, left 12/15/2013  . Chest pain 12/15/2013  . Claudication ([place]) 12/11/2013  . PVD (peripheral vascular disease) ([place]) 12/11/2013  . Peripheral arterial disease ([place]) 09/30/2013  . Carotid artery disease ([place]) 09/30/2013  . Acute chest pain 11/15/2012  . Left-sided chest wall pain 03/19/2012  . Bruit 09/15/2010  . CAD (coronary artery disease) nonobstructive per cath 2012   . Chronic kidney disease (CKD), stage IV (severe) ([place])   . CHEST PAIN, PRECORDIAL 07/26/2009  . DM (diabetes mellitus) ([place]) 07/22/2009  . Hyperlipidemia LDL goal <70 07/22/2009  . Hypertension 07/22/2009    Past Surgical History:  Procedure Laterality Date  . ANGIOPLASTY / STENTING FEMORAL Left 12/11/2013   dr berry  . AV FISTULA PLACEMENT Left 03/19/2014   Procedure: CREATION OF ARTERIOVENOUS (AV) FISTULA  LEFT UPPER ARM;  Surgeon: Mal Misty, MD;  Location: [facility];  Service: Vascular;  Laterality: Left;  . CARDIAC CATHETERIZATION  2001 and 2010   . COLONOSCOPY W/ BIOPSIES AND POLYPECTOMY    . COLONOSCOPY WITH  PROPOFOL N/A 08/01/2016   Procedure: COLONOSCOPY WITH PROPOFOL;  Surgeon: Carol Ada, MD;  Location: [facility] ENDOSCOPY;  Service: Endoscopy;  Laterality: N/A;  . ESOPHAGOGASTRODUODENOSCOPY (EGD) WITH PROPOFOL N/A 08/01/2016   Procedure: ESOPHAGOGASTRODUODENOSCOPY (EGD) WITH PROPOFOL;  Surgeon: Carol Ada, MD;  Location: [facility] ENDOSCOPY;  Service: Endoscopy;  Laterality: N/A;  . FOOT FRACTURE SURGERY Right   . FRACTURE SURGERY    . INGUINAL HERNIA REPAIR Left   . LEFT HEART CATHETERIZATION WITH CORONARY ANGIOGRAM N/A 06/22/2014   Procedure: LEFT HEART CATHETERIZATION WITH CORONARY ANGIOGRAM;  Surgeon: Troy Sine, MD;  Location: [facility] CATH LAB;  Service: Cardiovascular;  Laterality: N/A;  . LOWER EXTREMITY ANGIOGRAM Left 12/11/2013   Procedure: LOWER EXTREMITY ANGIOGRAM;  Surgeon: Lorretta Harp, MD;  Location: [facility] C[facility] LAB;  Service: Cardiovascular;  Laterality:  Left;  . TONSILLECTOMY AND ADENOIDECTOMY         Home Medications    Prior to Admission medications   Medication Sig Start Date End Date Taking? Authorizing Provider  aspirin EC 81 MG tablet Take 1 tablet (81 mg total) by mouth daily. 05/28/12  Yes Weaver, Scott T, PA-C  atorvastatin (LIPITOR) 40 MG tablet Take 1 tablet (40 mg total) by mouth daily at 6 PM. 12/13/13  Yes Brett Canales, PA-C  calcium acetate (PHOSLO) 667 MG capsule Take 3 capsules (2,001 mg total) by mouth 3 (three) times daily with meals. Patient taking differently: Take 2,001 mg by mouth 3 (three) times daily with meals. May take 667 -1334 mg with snacks 10/09/15  Yes Short, Noah Delaine, MD  cinacalcet (SENSIPAR) 60 MG tablet Take 120 mg by mouth daily. At dialysis   Yes [provider]  clonazePAM Bobbye [place]) 0.5 MG tablet 0.5-1mg  by mouth at bedtime 09/22/14  Yes [provider]  clopidogrel (PLAVIX) 75 MG tablet Take 1 tablet (75 mg total) by mouth daily with breakfast. 12/13/13  Yes Brett Canales, PA-C  gabapentin (NEURONTIN) 100 MG capsule Take 300 mg by mouth 2 (two) times daily.  10/02/14  Yes [provider]  HYDROcodone-acetaminophen (NORCO/VICODIN) 5-325 MG tablet Take 1 tablet by mouth every 6 (six) hours as needed for pain. 10/02/16  Yes [provider]  lisinopril (PRINIVIL,ZESTRIL) 20 MG tablet Take 20 mg by mouth at bedtime.   Yes [provider]  multivitamin (RENA-VIT) TABS tablet Take 1 tablet by mouth at bedtime. 06/23/14  Yes Domenic Polite, MD  nitroGLYCERIN (NITROSTAT) 0.3 MG SL tablet Place 0.3 mg under the tongue every 5 (five) minutes as needed for chest pain.   Yes [provider]  NOVOLOG 100 UNIT/ML injection 0.75 units per hour via insulin pump per pt - - USE AS DIRECTED (TRITRATE AS NEEDED) *MAX 50 UNITS A DAY* 08/28/14  Yes [provider]  pantoprazole (PROTONIX) 40 MG tablet Take 40 mg by mouth daily.   Yes [provider]    promethazine (PHENERGAN) 25 MG tablet Take 25 mg by mouth every 6 (six) hours as needed for nausea or vomiting.   Yes [provider]  rOPINIRole (REQUIP) 0.5 MG tablet Take 0.5 mg by mouth 2 (two) times daily.    Yes [provider]  tamsulosin (FLOMAX) 0.4 MG CAPS capsule Take 0.4 mg by mouth daily.   Yes [provider]  nitroGLYCERIN (NITROSTAT) 0.4 MG SL tablet Place 1 tablet (0.4 mg total) under the tongue every 5 (five) minutes as needed for chest pain. Patient not taking: Reported on 10/04/2016 03/20/12   Barrett, Evelene Croon,  PA-C  ondansetron (ZOFRAN) 4 MG tablet Take 1 tablet (4 mg total) by mouth every 6 (six) hours as needed for nausea or vomiting. Patient not taking: Reported on 10/04/2016 06/14/16   Forde Dandy, MD    Family History Family History  Problem Relation Age of Onset  . Heart attack Sister        died @ 81  . Cancer Mother        died @ 65; unknown type  . Diabetes Brother        deceased  . Cirrhosis Father        alcohol related  . Diabetes Father   . Esophageal cancer Neg Hx   . Colon cancer Neg Hx   . Pancreatic cancer Neg Hx   . Stomach cancer Neg Hx     Social History Social History  Substance Use Topics  . Smoking status: Former Smoker    Packs/day: 1.00    Years: 2.00    Types: Cigarettes  . Smokeless tobacco: Never Used     Comment: quit smoking 40 yrs ago  . Alcohol use No     Allergies   Kiwi extract and Tape   Review of Systems Review of Systems  Cardiovascular: Positive for chest pain.  All other systems reviewed and are negative.    Physical Exam Updated Vital Signs BP (!) 154/68   Pulse 66   Temp 98.1 F (36.7 C) (Oral)   Resp 14   SpO2 99%   Physical Exam  Constitutional: He is oriented to person, place, and time. He appears well-developed and well-nourished.  HENT:  Head: Normocephalic and atraumatic.  Right Ear: External ear normal.  Left Ear: External ear normal.  Nose: Nose normal.   Mouth/Throat: Oropharynx is clear and moist.  Eyes: Conjunctivae and EOM are normal. Pupils are equal, round, and reactive to light.  Neck: Normal range of motion. Neck supple.  Cardiovascular: Normal rate, regular rhythm, normal heart sounds and intact distal pulses.   Pulmonary/Chest: Effort normal and breath sounds normal.  Abdominal: Soft. Bowel sounds are normal.  Musculoskeletal:       Arms: Neurological: He is alert and oriented to person, place, and time.  Skin: Skin is warm.  Psychiatric: He has a normal mood and affect. His behavior is normal. Judgment and thought content normal.  Nursing note and vitals reviewed.    ED Treatments / Results  Labs (all labs ordered are listed, but only abnormal results are displayed) Labs Reviewed  BASIC METABOLIC PANEL - Abnormal; Notable for the following:       Result Value   Sodium 134 (*)    Potassium 3.4 (*)    Chloride 95 (*)    Glucose, Bld 140 (*)    BUN 24 (*)    Creatinine, Ser 7.24 (*)    Calcium 8.7 (*)    GFR calc non Af Amer 7 (*)    GFR calc Af Amer 8 (*)    All other components within normal limits  CBC - Abnormal; Notable for the following:    RBC 3.38 (*)    Hemoglobin 9.7 (*)    HCT 29.9 (*)    All other components within normal limits  TROPONIN I - Abnormal; Notable for the following:    Troponin I 0.04 (*)    All other components within normal limits  CBG MONITORING, ED - Abnormal; Notable for the following:    Glucose-Capillary 145 (*)    All other  components within normal limits    EKG  EKG Interpretation  Date/Time:  Wednesday October 04 2016 09:48:04 EDT Ventricular Rate:  71 PR Interval:    QRS Duration: 101 QT Interval:  392 QTC Calculation: 426 R Axis:   88 Text Interpretation:  Sinus rhythm Atrial premature complex Probable anteroseptal infarct, recent No significant change since last tracing Confirmed by Isla Pence [phone]) on 10/04/2016 9:50:22 AM Also confirmed by Isla Pence  [phone]), editor Laurena Spies [phone])  on 10/04/2016 10:27:43 AM       Radiology Dg Chest 2 View  Result Date: 10/04/2016 CLINICAL DATA:  Chest pain EXAM: CHEST  2 VIEW COMPARISON:  08/16/2016 FINDINGS: Heart and mediastinal contours are within normal limits. No focal opacities or effusions. No acute bony abnormality. IMPRESSION: No active cardiopulmonary disease. Electronically Signed   By: Rolm Baptise M.D.   On: 10/04/2016 11:13    Procedures Procedures (including critical care time)  Medications Ordered in ED Medications  morphine 4 MG/ML injection 4 mg (4 mg Intravenous Given 10/04/16 1003)  ondansetron (ZOFRAN) injection 4 mg (4 mg Intravenous Given 10/04/16 1003)     Initial Impression / Assessment and Plan / ED Course  I have reviewed the triage vital signs and the nursing notes.  Pertinent labs & imaging results that were available during my care of the patient were reviewed by me and considered in my medical decision making (see chart for details).    Pt's EKG is unchanged and troponin is at baseline.  He has had a cath in the last 2 years that was normal.  Stress test last month that was normal.  I don't think pt's pain is cardiac in nature.  He is instructed to return if worse and to f/u with his cardiologist and pcp.  Final Clinical Impressions(s) / ED Diagnoses   Final diagnoses:  Atypical chest pain  End stage renal disease on dialysis [facility])    New Prescriptions New Prescriptions   No medications on file     Isla Pence, MD 10/04/16 1209

## 2016-10-20 ENCOUNTER — Ambulatory Visit (HOSPITAL_BASED_OUTPATIENT_CLINIC_OR_DEPARTMENT_OTHER): Payer: BC Managed Care – PPO | Attending: Internal Medicine | Admitting: Internal Medicine

## 2016-10-20 VITALS — Ht 71.0 in | Wt 185.0 lb

## 2016-10-20 DIAGNOSIS — G4761 Periodic limb movement disorder: Secondary | ICD-10-CM | POA: Insufficient documentation

## 2016-10-20 DIAGNOSIS — Z9989 Dependence on other enabling machines and devices: Secondary | ICD-10-CM

## 2016-10-20 DIAGNOSIS — R5383 Other fatigue: Secondary | ICD-10-CM | POA: Diagnosis not present

## 2016-10-20 DIAGNOSIS — R0683 Snoring: Secondary | ICD-10-CM

## 2016-10-20 DIAGNOSIS — G2581 Restless legs syndrome: Secondary | ICD-10-CM

## 2016-10-20 DIAGNOSIS — G4733 Obstructive sleep apnea (adult) (pediatric): Secondary | ICD-10-CM | POA: Insufficient documentation

## 2016-10-20 DIAGNOSIS — Z79899 Other long term (current) drug therapy: Secondary | ICD-10-CM | POA: Diagnosis not present

## 2016-10-20 DIAGNOSIS — G4730 Sleep apnea, unspecified: Secondary | ICD-10-CM

## 2016-10-28 DIAGNOSIS — G4733 Obstructive sleep apnea (adult) (pediatric): Secondary | ICD-10-CM

## 2016-10-28 NOTE — Procedures (Signed)
Patient Name: Jon Anderson, Jon Anderson Date: 10/20/2016 Gender: Male D.O.B: January 30, 1951 Age (years): 65 Referring Provider: [name] Height (inches): 71 Interpreting Physician: Baird Lyons MD, ABSM Weight (lbs): 185 RPSGT: Jonna Coup BMI: 26 MRN: [id number] Neck Size: 15.00 CLINICAL INFORMATION The patient is referred for a split night study with BPAP.  MEDICATIONS Medications self-administered by patient taken the night of the study : clonazepam, lisinopril, gabapentin, hydrocodone  SLEEP STUDY TECHNIQUE As per the AASM Manual for the Scoring of Sleep and Associated Events v2.3 (April 2016) with a hypopnea requiring 4% desaturations.  The channels recorded and monitored were frontal, central and occipital EEG, electrooculogram (EOG), submentalis EMG (chin), nasal and oral airflow, thoracic and abdominal wall motion, anterior tibialis EMG, snore microphone, electrocardiogram, and pulse oximetry. Bi-level positive airway pressure (BiPAP) was initiated when the patient met split night criteria and was titrated according to treat sleep-disordered breathing.  RESPIRATORY PARAMETERS Diagnostic  Total AHI (/hr): 42.5 RDI (/hr): 44.2 OA Index (/hr): 18 CA Index (/hr): 0.0 REM AHI (/hr): 5.9 NREM AHI (/hr): 52.7 Supine AHI (/hr): 67.6 Non-supine AHI (/hr): 3.30 Min O2 Sat (%): 75.00 Mean O2 (%): 92.70 Time below 88% (min): 16.7   Titration  Optimal IPAP Pressure (cm):  Optimal EPAP Pressure (cm):  AHI at Optimal Pressure (/hr): N/A Min O2 at Optimal Pressure (%): 78.00 Sleep % at Optimal (%): N/A Supine % at Optimal (%): N/A     SLEEP ARCHITECTURE The study was initiated at 10:24:48 PM and terminated at 4:25:07 AM. The total recorded time was 360.3 minutes. EEG confirmed total sleep time was 308.9 minutes yielding a sleep efficiency of 85.7%. Sleep onset after lights out was 0.0 minutes with a REM latency of 87.9 minutes. The patient spent 3.85% of the night in stage N1  sleep, 78.18% in stage N2 sleep, 0.00% in stage N3 and 17.97% in REM. Wake after sleep onset (WASO) was 51.4 minutes. The Arousal Index was 24.5/hour.  LEG MOVEMENT DATA The total Periodic Limb Movements of Sleep (PLMS) were 67. The PLMS index was 13.01 .  CARDIAC DATA The 2 lead EKG demonstrated sinus rhythm. The mean heart rate was 66.03 beats per minute. Other EKG findings include: PVCs.  IMPRESSIONS - Severe obstructive sleep apnea occurred during the diagnostic portion of the study (AHI = 42.5 /hour). An optimal BPAP pressure could not be selected for this patient based on the available study data. CPAP did not control events. BIPAP was titrated to 17/13 with residual obstructive apneas in the available time. - No significant central sleep apnea occurred during the diagnostic portion of the study (CAI = 0.0/hour). - Severe oxygen desaturation was noted during the diagnostic portion of the study (Min O2 = 75.00%). - The patient snored with Loud snoring volume during the diagnostic portion of the study. - EKG findings include PVCs. - Moderate periodic limb movements of sleep occurred during the study.  DIAGNOSIS - Obstructive Sleep Apnea (327.23 [G47.33 ICD-10])  - Periodic Limb Movement Syndrome (327.51 [G47.61 ICD-10])  RECOMMENDATIONS - Recommend return for dedicated BiPAP titration study given sub-optimal titration in the time available on this study night. - Avoid alcohol, sedatives and other CNS depressants that may worsen sleep apnea and disrupt normal sleep architecture. - Sleep hygiene should be reviewed to assess factors that may improve sleep quality. - Weight management and regular exercise should be initiated or continued.  [Electronically signed] 10/28/2016 11:20 AM  Baird Lyons MD, ABSM Diplomate, American Board of Sleep Medicine   NPI:  [id number]  [place], American Board of Sleep Medicine  ELECTRONICALLY SIGNED ON:  10/28/2016, 11:14 AM [place] PH: [phone]   FX: [phone] [place]

## 2016-10-30 ENCOUNTER — Emergency Department (HOSPITAL_COMMUNITY): Payer: BC Managed Care – PPO

## 2016-10-30 ENCOUNTER — Emergency Department (HOSPITAL_COMMUNITY)
Admission: EM | Admit: 2016-10-30 | Discharge: 2016-10-30 | Disposition: A | Discharge status: Home / Self Care | Payer: BC Managed Care – PPO | Attending: Emergency Medicine | Admitting: Emergency Medicine

## 2016-10-30 ENCOUNTER — Encounter (HOSPITAL_COMMUNITY): Payer: Self-pay | Admitting: Emergency Medicine

## 2016-10-30 DIAGNOSIS — Z794 Long term (current) use of insulin: Secondary | ICD-10-CM | POA: Insufficient documentation

## 2016-10-30 DIAGNOSIS — Z992 Dependence on renal dialysis: Secondary | ICD-10-CM | POA: Insufficient documentation

## 2016-10-30 DIAGNOSIS — E1122 Type 2 diabetes mellitus with diabetic chronic kidney disease: Secondary | ICD-10-CM | POA: Diagnosis not present

## 2016-10-30 DIAGNOSIS — I2510 Atherosclerotic heart disease of native coronary artery without angina pectoris: Secondary | ICD-10-CM | POA: Diagnosis not present

## 2016-10-30 DIAGNOSIS — I120 Hypertensive chronic kidney disease with stage 5 chronic kidney disease or end stage renal disease: Secondary | ICD-10-CM | POA: Insufficient documentation

## 2016-10-30 DIAGNOSIS — Z7982 Long term (current) use of aspirin: Secondary | ICD-10-CM | POA: Diagnosis not present

## 2016-10-30 DIAGNOSIS — Z87891 Personal history of nicotine dependence: Secondary | ICD-10-CM | POA: Diagnosis not present

## 2016-10-30 DIAGNOSIS — Z7902 Long term (current) use of antithrombotics/antiplatelets: Secondary | ICD-10-CM | POA: Insufficient documentation

## 2016-10-30 DIAGNOSIS — N186 End stage renal disease: Secondary | ICD-10-CM | POA: Diagnosis not present

## 2016-10-30 DIAGNOSIS — R079 Chest pain, unspecified: Secondary | ICD-10-CM | POA: Insufficient documentation

## 2016-10-30 DIAGNOSIS — Z79899 Other long term (current) drug therapy: Secondary | ICD-10-CM | POA: Diagnosis not present

## 2016-10-30 DIAGNOSIS — R072 Precordial pain: Secondary | ICD-10-CM

## 2016-10-30 LAB — BASIC METABOLIC PANEL
Anion gap: 9 (ref 5–15)
BUN: 19 mg/dL (ref 6–20)
CO2: 29 mmol/L (ref 22–32)
Calcium: 8.7 mg/dL — ABNORMAL LOW (ref 8.9–10.3)
Chloride: 96 mmol/L — ABNORMAL LOW (ref 101–111)
Creatinine, Ser: 6.64 mg/dL — ABNORMAL HIGH (ref 0.61–1.24)
GFR calc Af Amer: 9 mL/min — ABNORMAL LOW (ref >60)
GFR calc non Af Amer: 8 mL/min — ABNORMAL LOW (ref >60)
Glucose, Bld: 80 mg/dL (ref 65–99)
Potassium: 3.5 mmol/L (ref 3.5–5.1)
Sodium: 134 mmol/L — ABNORMAL LOW (ref 135–145)

## 2016-10-30 LAB — CBC
HCT: 31.4 % — ABNORMAL LOW (ref 39.0–52.0)
Hemoglobin: 10.8 g/dL — ABNORMAL LOW (ref 13.0–17.0)
MCH: 30.5 pg (ref 26.0–34.0)
MCHC: 34.4 g/dL (ref 30.0–36.0)
MCV: 88.7 fL (ref 78.0–100.0)
Platelets: 217 10*3/uL (ref 150–400)
RBC: 3.54 MIL/uL — ABNORMAL LOW (ref 4.22–5.81)
RDW: 15.7 % — ABNORMAL HIGH (ref 11.5–15.5)
WBC: 5.4 10*3/uL (ref 4.0–10.5)

## 2016-10-30 LAB — I-STAT TROPONIN, ED
Troponin i, poc: 0.03 ng/mL (ref 0.00–0.08)
Troponin i, poc: 0.05 ng/mL (ref 0.00–0.08)

## 2016-10-30 MED ORDER — GI COCKTAIL ~~LOC~~
30.0000 mL | Freq: Once | ORAL | Status: AC
  Administered 2016-10-30: 30 mL via ORAL
  Filled 2016-10-30: qty 30

## 2016-10-30 MED ORDER — MORPHINE SULFATE (PF) 4 MG/ML IV SOLN
4.0000 mg | Freq: Once | INTRAVENOUS | Status: AC
  Administered 2016-10-30: 4 mg via INTRAVENOUS
  Filled 2016-10-30: qty 1

## 2016-10-30 NOTE — ED Notes (Signed)
Iv team at bedside to de-access dialysis fistula

## 2016-10-30 NOTE — ED Triage Notes (Signed)
Pt to ER from dialysis center for acute onset of substernal chest pain described as sharp while getting treatment. Came off machine one hour early. A/o x4. Received 324 mg aspirin in route as well as one nitro without relief.

## 2016-10-30 NOTE — ED Notes (Signed)
Patient transported to X-ray 

## 2016-10-30 NOTE — ED Notes (Signed)
Pt is very sleepy during assessment, falls asleep during our conversation. Pt states he didn't sleep well last night.

## 2016-10-30 NOTE — ED Notes (Signed)
Pt returned from xray. Resting comfortably. Pt states he wears O2 prn during dialysis and arrived to the ED today wearing O2. Pt placed back on O2 2lpm due to sats dropping into the 80s while pt is sleeping.

## 2016-11-07 NOTE — ED Provider Notes (Signed)
[facility] DEPT Provider Note   CSN: [id number] Arrival date & time: 10/30/16  1018     History   Chief Complaint Chief Complaint  Patient presents with  . Chest Pain    HPI Jon RODRIGUE is a 66 y.o. male.  HPI Patient presents the emergency department after developing chest discomfort while at dialysis today.  He described the discomfort is sharp while he was getting treatment.  He did the majority of his treatment but did and 1 hour before the completion of his full dialysis treatment.  He was given aspirin in route by EMS.  He did undergo heart catheterization 2016 which demonstrated normal coronary arteries.  He has a history of recurrent and chronic chest pain.  No prior history of DVT or pulmonary embolism.  He reports his discomfort is sharp and continuous.  He does have a history of gastroesophageal reflux disease.  No prior medications prior to arrival besides the aspirin given by EMS.  Patient states he feels better at this time.   Past Medical History:  Diagnosis Date  . Anemia of chronic disease   . CAD (coronary artery disease)    a.  Myoview 4/11: EF 53%, no scar or ischemia   c. MV 2012 Nl perfusion, apical thinning.  No ischemia or scar.  EF 49%, appears greater by visual estimate.;  d.  Dob stress echo 12/13:  Negative Dob stress echo. There is no evidence of ischemia.  The LVF is normal. b. Normal cors 2016.  . Carotid stenosis    a. <82% RICA, >42% LICA by duplex 06/5359  . Chronic chest pain    occ  . ESRD (end stage renal disease) on dialysis ([place])   . GERD (gastroesophageal reflux disease)   . HTN (hypertension)    echo 3/10: EF 60%, LAE  . Hyperlipidemia   . Nephrolithiasis    "passed them all"  . Peripheral arterial disease ([place])    a. s/p PTCA to L SFA.  Marland Kitchen Pneumonia   . Restless legs   . Snores    a. presumed OSA, pt has refused sleep eval in past.  . Type II diabetes mellitus [facility])     Patient Active Problem List   Diagnosis Date  Noted  . Chest pain, non-cardiac 10/09/2015  . Acute on chronic renal failure ([place]) 06/16/2014  . Renal failure (ARF), acute on chronic (HCC) 06/16/2014  . Shoulder pain, left 12/15/2013  . Chest pain 12/15/2013  . Claudication ([place]) 12/11/2013  . PVD (peripheral vascular disease) ([place]) 12/11/2013  . Peripheral arterial disease ([place]) 09/30/2013  . Carotid artery disease ([place]) 09/30/2013  . Acute chest pain 11/15/2012  . Left-sided chest wall pain 03/19/2012  . Bruit 09/15/2010  . CAD (coronary artery disease) nonobstructive per cath 2012   . Chronic kidney disease (CKD), stage IV (severe) ([place])   . CHEST PAIN, PRECORDIAL 07/26/2009  . DM (diabetes mellitus) ([place]) 07/22/2009  . Hyperlipidemia LDL goal <70 07/22/2009  . Hypertension 07/22/2009    Past Surgical History:  Procedure Laterality Date  . ANGIOPLASTY / STENTING FEMORAL Left 12/11/2013   dr berry  . AV FISTULA PLACEMENT Left 03/19/2014   Procedure: CREATION OF ARTERIOVENOUS (AV) FISTULA  LEFT UPPER ARM;  Surgeon: Mal Misty, MD;  Location: [facility];  Service: Vascular;  Laterality: Left;  . CARDIAC CATHETERIZATION  2001 and 2010   . COLONOSCOPY W/ BIOPSIES AND POLYPECTOMY    . COLONOSCOPY WITH PROPOFOL N/A 08/01/2016   Procedure: COLONOSCOPY WITH  PROPOFOL;  Surgeon: Carol Ada, MD;  Location: [facility];  Service: Endoscopy;  Laterality: N/A;  . ESOPHAGOGASTRODUODENOSCOPY (EGD) WITH PROPOFOL N/A 08/01/2016   Procedure: ESOPHAGOGASTRODUODENOSCOPY (EGD) WITH PROPOFOL;  Surgeon: Carol Ada, MD;  Location: [facility];  Service: Endoscopy;  Laterality: N/A;  . FOOT FRACTURE SURGERY Right   . FRACTURE SURGERY    . INGUINAL HERNIA REPAIR Left   . LEFT HEART CATHETERIZATION WITH CORONARY ANGIOGRAM N/A 06/22/2014   Procedure: LEFT HEART CATHETERIZATION WITH CORONARY ANGIOGRAM;  Surgeon: Troy Sine, MD;  Location: [facility] CATH LAB;  Service: Cardiovascular;  Laterality: N/A;  . LOWER EXTREMITY ANGIOGRAM Left 12/11/2013    Procedure: LOWER EXTREMITY ANGIOGRAM;  Surgeon: Lorretta Harp, MD;  Location: [facility] CATH LAB;  Service: Cardiovascular;  Laterality: Left;  . TONSILLECTOMY AND ADENOIDECTOMY         Home Medications    Prior to Admission medications   Medication Sig Start Date End Date Taking? Authorizing Provider  aspirin EC 81 MG tablet Take 1 tablet (81 mg total) by mouth daily. 05/28/12   Richardson Dopp T, PA-C  atorvastatin (LIPITOR) 40 MG tablet Take 1 tablet (40 mg total) by mouth daily at 6 PM. 12/13/13   Brett Canales, PA-C  calcium acetate (PHOSLO) 667 MG capsule Take 3 capsules (2,001 mg total) by mouth 3 (three) times daily with meals. Patient taking differently: Take 2,001 mg by mouth 3 (three) times daily with meals. May take 667 -1334 mg with snacks 10/09/15   Janece Canterbury, MD  cinacalcet (SENSIPAR) 60 MG tablet Take 120 mg by mouth daily. At dialysis    [provider]  clonazePAM Bobbye [place]) 0.5 MG tablet 0.5-1mg  by mouth at bedtime 09/22/14   [provider]  clopidogrel (PLAVIX) 75 MG tablet Take 1 tablet (75 mg total) by mouth daily with breakfast. 12/13/13   Brett Canales, PA-C  gabapentin (NEURONTIN) 100 MG capsule Take 300 mg by mouth 2 (two) times daily.  10/02/14   [provider]  HYDROcodone-acetaminophen (NORCO/VICODIN) 5-325 MG tablet Take 1 tablet by mouth every 6 (six) hours as needed for pain. 10/02/16   [provider]  lisinopril (PRINIVIL,ZESTRIL) 20 MG tablet Take 20 mg by mouth at bedtime.    [provider]  multivitamin (RENA-VIT) TABS tablet Take 1 tablet by mouth at bedtime. 06/23/14   Domenic Polite, MD  nitroGLYCERIN (NITROSTAT) 0.3 MG SL tablet Place 0.3 mg under the tongue every 5 (five) minutes as needed for chest pain.    [provider]  nitroGLYCERIN (NITROSTAT) 0.4 MG SL tablet Place 1 tablet (0.4 mg total) under the tongue every 5 (five) minutes as needed for chest pain. Patient not taking: Reported on 10/04/2016  03/20/12   Barrett, Evelene Croon, PA-C  NOVOLOG 100 UNIT/ML injection 0.75 units per hour via insulin pump per pt - - USE AS DIRECTED (TRITRATE AS NEEDED) *MAX 50 UNITS A DAY* 08/28/14   [provider]  ondansetron (ZOFRAN) 4 MG tablet Take 1 tablet (4 mg total) by mouth every 6 (six) hours as needed for nausea or vomiting. Patient not taking: Reported on 10/04/2016 06/14/16   Forde Dandy, MD  pantoprazole (PROTONIX) 40 MG tablet Take 40 mg by mouth daily.    [provider]  promethazine (PHENERGAN) 25 MG tablet Take 25 mg by mouth every 6 (six) hours as needed for nausea or vomiting.    [provider]  rOPINIRole (REQUIP) 0.5 MG tablet Take 0.5 mg by mouth 2 (  two) times daily.     [provider]  tamsulosin (FLOMAX) 0.4 MG CAPS capsule Take 0.4 mg by mouth daily.    [provider]    Family History Family History  Problem Relation Age of Onset  . Heart attack Sister        died @ 13  . Cancer Mother        died @ 13; unknown type  . Diabetes Brother        deceased  . Cirrhosis Father        alcohol related  . Diabetes Father   . Esophageal cancer Neg Hx   . Colon cancer Neg Hx   . Pancreatic cancer Neg Hx   . Stomach cancer Neg Hx     Social History Social History  Substance Use Topics  . Smoking status: Former Smoker    Packs/day: 1.00    Years: 2.00    Types: Cigarettes  . Smokeless tobacco: Never Used     Comment: quit smoking 40 yrs ago  . Alcohol use No     Allergies   Kiwi extract and Tape   Review of Systems Review of Systems  All other systems reviewed and are negative.    Physical Exam Updated Vital Signs BP (!) 174/87 (BP Location: Right Arm)   Pulse (!) 58   Temp 97.8 F (36.6 C) (Oral)   Resp 16   SpO2 100%   Physical Exam  Constitutional: He is oriented to person, place, and time. He appears well-developed and well-nourished.  HENT:  Head: Normocephalic and atraumatic.  Eyes: EOM are normal.    Neck: Normal range of motion.  Cardiovascular: Normal rate, regular rhythm, normal heart sounds and intact distal pulses.   Pulmonary/Chest: Effort normal and breath sounds normal. No respiratory distress.  Abdominal: Soft. He exhibits no distension. There is no tenderness.  Musculoskeletal: Normal range of motion.  Neurological: He is alert and oriented to person, place, and time.  Skin: Skin is warm and dry.  Psychiatric: He has a normal mood and affect. Judgment normal.  Nursing note and vitals reviewed.    ED Treatments / Results  Labs (all labs ordered are listed, but only abnormal results are displayed) Labs Reviewed  BASIC METABOLIC PANEL - Abnormal; Notable for the following:       Result Value   Sodium 134 (*)    Chloride 96 (*)    Creatinine, Ser 6.64 (*)    Calcium 8.7 (*)    GFR calc non Af Amer 8 (*)    GFR calc Af Amer 9 (*)    All other components within normal limits  CBC - Abnormal; Notable for the following:    RBC 3.54 (*)    Hemoglobin 10.8 (*)    HCT 31.4 (*)    RDW 15.7 (*)    All other components within normal limits  I-STAT TROPONIN, ED  I-STAT TROPONIN, ED    EKG  EKG Interpretation  Date/Time:  Monday October 30 2016 10:26:02 EDT Ventricular Rate:  80 PR Interval:    QRS Duration: 104 QT Interval:  405 QTC Calculation: 468 R Axis:   39 Text Interpretation:  Sinus rhythm Probable anteroseptal infarct, recent Baseline wander in lead(s) I II aVR No significant change was found Confirmed by Jola Schmidt [phone]) on 10/30/2016 10:38:34 AM Also confirmed by Jola Schmidt [phone]), editor Drema Pry [phone])  on 10/30/2016 11:07:13 AM       Radiology No results found.  Procedures Procedures (including critical care time)  Medications Ordered in ED Medications  morphine 4 MG/ML injection 4 mg (4 mg Intravenous Given 10/30/16 1046)  gi cocktail (Maalox,Lidocaine,Donnatal) (30 mLs Oral Given 10/30/16 1046)     Initial Impression /  Assessment and Plan / ED Course  I have reviewed the triage vital signs and the nursing notes.  Pertinent labs & imaging results that were available during my care of the patient were reviewed by me and considered in my medical decision making (see chart for details).     Nonischemic EKG.  Troponin negative 2.  Primary care follow-up.  Atypical chest pain.  Patient understands return to the ER for new or worsening symptoms  Final Clinical Impressions(s) / ED Diagnoses   Final diagnoses:  Precordial chest pain    New Prescriptions Discharge Medication List as of 10/30/2016  3:14 PM       Jola Schmidt, MD 11/07/16 1221

## 2016-11-21 ENCOUNTER — Encounter: Payer: Self-pay | Admitting: Podiatry

## 2016-11-21 ENCOUNTER — Ambulatory Visit (INDEPENDENT_AMBULATORY_CARE_PROVIDER_SITE_OTHER): Payer: BC Managed Care – PPO | Admitting: Podiatry

## 2016-11-21 DIAGNOSIS — B351 Tinea unguium: Secondary | ICD-10-CM | POA: Diagnosis not present

## 2016-11-21 DIAGNOSIS — M79676 Pain in unspecified toe(s): Secondary | ICD-10-CM

## 2016-11-21 DIAGNOSIS — Q828 Other specified congenital malformations of skin: Secondary | ICD-10-CM | POA: Diagnosis not present

## 2016-11-21 DIAGNOSIS — E1142 Type 2 diabetes mellitus with diabetic polyneuropathy: Secondary | ICD-10-CM | POA: Diagnosis not present

## 2016-11-21 NOTE — Progress Notes (Signed)
He presents today with history of diabetes and renal failure. He is complaining of painful elongated toenails with causing calluses.  Objective: Vital signs are stable he is alert and oriented 3. Pulses are palpable. Neurologic sensorium is intact. Slightly diminished per Semmes-Weinstein monofilament. Toenails elongated yellowish dystrophic onychomycotic as well as painful porokeratotic lesions plantar aspect bilateral foot.  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 Painful mycotic nails and poor keratoses.  Plan: Debridement of all reactive hyperkeratotic tissue and debridement of toenails 1 through 5 bilateral.

## 2016-11-29 ENCOUNTER — Emergency Department (HOSPITAL_COMMUNITY): Payer: BC Managed Care – PPO

## 2016-11-29 ENCOUNTER — Encounter (HOSPITAL_COMMUNITY): Payer: Self-pay

## 2016-11-29 ENCOUNTER — Emergency Department (HOSPITAL_COMMUNITY)
Admission: EM | Admit: 2016-11-29 | Discharge: 2016-12-01 | Disposition: A | Discharge status: Home / Self Care | Payer: BC Managed Care – PPO | Attending: Emergency Medicine | Admitting: Emergency Medicine

## 2016-11-29 DIAGNOSIS — N186 End stage renal disease: Secondary | ICD-10-CM | POA: Insufficient documentation

## 2016-11-29 DIAGNOSIS — Z7902 Long term (current) use of antithrombotics/antiplatelets: Secondary | ICD-10-CM | POA: Insufficient documentation

## 2016-11-29 DIAGNOSIS — R0789 Other chest pain: Secondary | ICD-10-CM

## 2016-11-29 DIAGNOSIS — Z794 Long term (current) use of insulin: Secondary | ICD-10-CM | POA: Insufficient documentation

## 2016-11-29 DIAGNOSIS — Z7982 Long term (current) use of aspirin: Secondary | ICD-10-CM | POA: Diagnosis not present

## 2016-11-29 DIAGNOSIS — I120 Hypertensive chronic kidney disease with stage 5 chronic kidney disease or end stage renal disease: Secondary | ICD-10-CM | POA: Insufficient documentation

## 2016-11-29 DIAGNOSIS — Z992 Dependence on renal dialysis: Secondary | ICD-10-CM | POA: Diagnosis not present

## 2016-11-29 DIAGNOSIS — Z87891 Personal history of nicotine dependence: Secondary | ICD-10-CM | POA: Insufficient documentation

## 2016-11-29 DIAGNOSIS — I2510 Atherosclerotic heart disease of native coronary artery without angina pectoris: Secondary | ICD-10-CM | POA: Insufficient documentation

## 2016-11-29 DIAGNOSIS — R072 Precordial pain: Secondary | ICD-10-CM | POA: Insufficient documentation

## 2016-11-29 DIAGNOSIS — E1122 Type 2 diabetes mellitus with diabetic chronic kidney disease: Secondary | ICD-10-CM | POA: Diagnosis not present

## 2016-11-29 LAB — I-STAT CHEM 8, ED
BUN: 29 mg/dL — ABNORMAL HIGH (ref 6–20)
Calcium, Ion: 1.08 mmol/L — ABNORMAL LOW (ref 1.15–1.40)
Chloride: 92 mmol/L — ABNORMAL LOW (ref 101–111)
Creatinine, Ser: 6.5 mg/dL — ABNORMAL HIGH (ref 0.61–1.24)
Glucose, Bld: 111 mg/dL — ABNORMAL HIGH (ref 65–99)
HCT: 46 % (ref 39.0–52.0)
Hemoglobin: 15.6 g/dL (ref 13.0–17.0)
Potassium: 3.7 mmol/L (ref 3.5–5.1)
Sodium: 134 mmol/L — ABNORMAL LOW (ref 135–145)
TCO2: 35 mmol/L (ref 0–100)

## 2016-11-29 LAB — I-STAT TROPONIN, ED: Troponin i, poc: 0.03 ng/mL (ref 0.00–0.08)

## 2016-11-29 MED ORDER — HYDROMORPHONE HCL 1 MG/ML IJ SOLN: 1.0000 mg | Freq: Once | INTRAMUSCULAR | Status: DC

## 2016-11-29 MED ORDER — HYDROCODONE-ACETAMINOPHEN 5-325 MG PO TABS: 1.0000 | ORAL_TABLET | Freq: Four times a day (QID) | ORAL | 0 refills | Status: DC | PRN

## 2016-11-29 NOTE — Discharge Instructions (Signed)
Follow up with your md in 1-2 weeks. °

## 2016-11-29 NOTE — ED Notes (Addendum)
In to discharge pt and he states he has recurrent chest pain after sleeping and being pain free. Dr.  Roderic Palau aware in will speak with PT.

## 2016-11-29 NOTE — ED Notes (Signed)
Pt sleeping with snoring respiration., No medications given. Dr.  Darlyn Chamber and states to hold dilaudid

## 2016-11-29 NOTE — ED Provider Notes (Signed)
[facility] DEPT Provider Note   CSN: [id number] Arrival date & time: 11/29/16  1031     History   Chief Complaint Chief Complaint  Patient presents with  . Chest Pain    HPI Jon Anderson is a 66 y.o. male.  Patient complains of chest pain today while he was getting dialysis. No other symptoms   The history is provided by the patient.  Chest Pain   The current episode started less than 1 hour ago. The problem occurs rarely. The problem has been resolved. The pain is associated with rest. The pain is present in the substernal region. The pain is at a severity of 3/10. The pain is mild. The quality of the pain is described as brief. The pain does not radiate. Pertinent negatives include no abdominal pain, no back pain, no cough and no headaches.  Pertinent negatives for past medical history include no seizures.    Past Medical History:  Diagnosis Date  . Anemia of chronic disease   . CAD (coronary artery disease)    a.  Myoview 4/11: EF 53%, no scar or ischemia   c. MV 2012 Nl perfusion, apical thinning.  No ischemia or scar.  EF 49%, appears greater by visual estimate.;  d.  Dob stress echo 12/13:  Negative Dob stress echo. There is no evidence of ischemia.  The LVF is normal. b. Normal cors 2016.  . Carotid stenosis    a. <42% RICA, >70% LICA by duplex 09/2374  . Chronic chest pain    occ  . ESRD (end stage renal disease) on dialysis ([place])   . GERD (gastroesophageal reflux disease)   . HTN (hypertension)    echo 3/10: EF 60%, LAE  . Hyperlipidemia   . Nephrolithiasis    "passed them all"  . Peripheral arterial disease ([place])    a. s/p PTCA to L SFA.  Marland Kitchen Pneumonia   . Restless legs   . Snores    a. presumed OSA, pt has refused sleep eval in past.  . Type II diabetes mellitus [facility])     Patient Active Problem List   Diagnosis Date Noted  . Chest pain, non-cardiac 10/09/2015  . Acute on chronic renal failure ([place]) 06/16/2014  . Renal failure (ARF), acute on  chronic (HCC) 06/16/2014  . Shoulder pain, left 12/15/2013  . Chest pain 12/15/2013  . Claudication ([place]) 12/11/2013  . PVD (peripheral vascular disease) ([place]) 12/11/2013  . Peripheral arterial disease ([place]) 09/30/2013  . Carotid artery disease ([place]) 09/30/2013  . Acute chest pain 11/15/2012  . Left-sided chest wall pain 03/19/2012  . Bruit 09/15/2010  . CAD (coronary artery disease) nonobstructive per cath 2012   . Chronic kidney disease (CKD), stage IV (severe) ([place])   . CHEST PAIN, PRECORDIAL 07/26/2009  . DM (diabetes mellitus) ([place]) 07/22/2009  . Hyperlipidemia LDL goal <70 07/22/2009  . Hypertension 07/22/2009    Past Surgical History:  Procedure Laterality Date  . ANGIOPLASTY / STENTING FEMORAL Left 12/11/2013   dr berry  . AV FISTULA PLACEMENT Left 03/19/2014   Procedure: CREATION OF ARTERIOVENOUS (AV) FISTULA  LEFT UPPER ARM;  Surgeon: Mal Misty, MD;  Location: [facility];  Service: Vascular;  Laterality: Left;  . CARDIAC CATHETERIZATION  2001 and 2010   . COLONOSCOPY W/ BIOPSIES AND POLYPECTOMY    . COLONOSCOPY WITH PROPOFOL N/A 08/01/2016   Procedure: COLONOSCOPY WITH PROPOFOL;  Surgeon: Carol Ada, MD;  [facility];  Service: Endoscopy;  Laterality: N/A;  .  ESOPHAGOGASTRODUODENOSCOPY (EGD) WITH PROPOFOL N/A 08/01/2016   Procedure: ESOPHAGOGASTRODUODENOSCOPY (EGD) WITH PROPOFOL;  Surgeon: Carol Ada, MD;  [facility];  Service: Endoscopy;  Laterality: N/A;  . FOOT FRACTURE SURGERY Right   . FRACTURE SURGERY    . INGUINAL HERNIA REPAIR Left   . LEFT HEART CATHETERIZATION WITH CORONARY ANGIOGRAM N/A 06/22/2014   Procedure: LEFT HEART CATHETERIZATION WITH CORONARY ANGIOGRAM;  Surgeon: Troy Sine, MD;  Location: [facility] CATH LAB;  Service: Cardiovascular;  Laterality: N/A;  . LOWER EXTREMITY ANGIOGRAM Left 12/11/2013   Procedure: LOWER EXTREMITY ANGIOGRAM;  Surgeon: Lorretta Harp, MD;  Location: [facility] CATH LAB;  Service: Cardiovascular;  Laterality:  Left;  . TONSILLECTOMY AND ADENOIDECTOMY         Home Medications    Prior to Admission medications   Medication Sig Start Date End Date Taking? Authorizing Provider  aspirin EC 81 MG tablet Take 1 tablet (81 mg total) by mouth daily. 05/28/12  Yes Weaver, Scott T, PA-C  atorvastatin (LIPITOR) 40 MG tablet Take 1 tablet (40 mg total) by mouth daily at 6 PM. 12/13/13  Yes Brett Canales, PA-C  calcium acetate (PHOSLO) 667 MG capsule Take 3 capsules (2,001 mg total) by mouth 3 (three) times daily with meals. Patient taking differently: Take 2,001 mg by mouth 3 (three) times daily with meals. May take 667 -1334 mg with snacks 10/09/15  Yes Short, Noah Delaine, MD  clonazePAM Bobbye [place]) 0.5 MG tablet 0.5-1mg  by mouth at bedtime 09/22/14  Yes [provider]  clopidogrel (PLAVIX) 75 MG tablet Take 1 tablet (75 mg total) by mouth daily with breakfast. 12/13/13  Yes Brett Canales, PA-C  gabapentin (NEURONTIN) 100 MG capsule Take 300 mg by mouth 2 (two) times daily.  10/02/14  Yes [provider]  lisinopril (PRINIVIL,ZESTRIL) 20 MG tablet Take 20 mg by mouth at bedtime.   Yes [provider]  multivitamin (RENA-VIT) TABS tablet Take 1 tablet by mouth at bedtime. 06/23/14  Yes Domenic Polite, MD  NOVOLOG 100 UNIT/ML injection 0.75 units per hour via insulin pump per pt - - USE AS DIRECTED (TRITRATE AS NEEDED) *MAX 50 UNITS A DAY* 08/28/14  Yes [provider]  ondansetron (ZOFRAN) 4 MG tablet Take 1 tablet (4 mg total) by mouth every 6 (six) hours as needed for nausea or vomiting. 06/14/16  Yes Forde Dandy, MD  pantoprazole (PROTONIX) 40 MG tablet Take 40 mg by mouth daily.   Yes [provider]  predniSONE (DELTASONE) 10 MG tablet Take 10 mg by mouth 3 (three) times daily. 11/24/16 12/02/16 Yes [provider]  promethazine (PHENERGAN) 25 MG tablet Take 25 mg by mouth every 6 (six) hours as needed for nausea or vomiting.   Yes [provider]    rOPINIRole (REQUIP) 0.5 MG tablet Take 0.5 mg by mouth 2 (two) times daily.    Yes [provider]  tamsulosin (FLOMAX) 0.4 MG CAPS capsule Take 0.4 mg by mouth daily.   Yes [provider]  cinacalcet (SENSIPAR) 60 MG tablet Take 120 mg by mouth daily. At dialysis    [provider]  HYDROcodone-acetaminophen (NORCO/VICODIN) 5-325 MG tablet Take 1 tablet by mouth every 6 (six) hours as needed for moderate pain. 11/29/16   Milton Ferguson, MD  nitroGLYCERIN (NITROSTAT) 0.4 MG SL tablet Place 1 tablet (0.4 mg total) under the tongue every 5 (five) minutes as needed for chest pain. 03/20/12   Barrett, Evelene Croon, PA-C    Family History Family  History  Problem Relation Age of Onset  . Heart attack Sister        died @ 28  . Cancer Mother        died @ 3; unknown type  . Diabetes Brother        deceased  . Cirrhosis Father        alcohol related  . Diabetes Father   . Esophageal cancer Neg Hx   . Colon cancer Neg Hx   . Pancreatic cancer Neg Hx   . Stomach cancer Neg Hx     Social History Social History  Substance Use Topics  . Smoking status: Former Smoker    Packs/day: 1.00    Years: 2.00    Types: Cigarettes  . Smokeless tobacco: Never Used     Comment: quit smoking 40 yrs ago  . Alcohol use No     Allergies   Kiwi extract and Tape   Review of Systems Review of Systems  Constitutional: Negative for appetite change and fatigue.  HENT: Negative for congestion, ear discharge and sinus pressure.   Eyes: Negative for discharge.  Respiratory: Negative for cough.   Cardiovascular: Positive for chest pain.  Gastrointestinal: Negative for abdominal pain and diarrhea.  Genitourinary: Negative for frequency and hematuria.  Musculoskeletal: Negative for back pain.  Skin: Negative for rash.  Neurological: Negative for seizures and headaches.  Psychiatric/Behavioral: Negative for hallucinations.     Physical Exam Updated Vital Signs BP (!) 179/87    Pulse 73   Resp 18   Ht 5\' 11"  (1.803 m)   Wt 83.9 kg (185 lb)   SpO2 94%   BMI 25.80 kg/m   Physical Exam  Constitutional: He is oriented to person, place, and time. He appears well-developed.  HENT:  Head: Normocephalic.  Eyes: Conjunctivae and EOM are normal. No scleral icterus.  Neck: Neck supple. No thyromegaly present.  Cardiovascular: Normal rate and regular rhythm.  Exam reveals no gallop and no friction rub.   No murmur heard. Pulmonary/Chest: No stridor. He has no wheezes. He has no rales. He exhibits no tenderness.  Abdominal: He exhibits no distension. There is no tenderness. There is no rebound.  Musculoskeletal: Normal range of motion. He exhibits no edema.  Lymphadenopathy:    He has no cervical adenopathy.  Neurological: He is oriented to person, place, and time. He exhibits normal muscle tone. Coordination normal.  Skin: No rash noted. No erythema.  Psychiatric: He has a normal mood and affect. His behavior is normal.     ED Treatments / Results  Labs (all labs ordered are listed, but only abnormal results are displayed) Labs Reviewed  I-STAT CHEM 8, ED - Abnormal; Notable for the following:       Result Value   Sodium 134 (*)    Chloride 92 (*)    BUN 29 (*)    Creatinine, Ser 6.50 (*)    Glucose, Bld 111 (*)    Calcium, Ion 1.08 (*)    All other components within normal limits  I-STAT TROPONIN, ED    EKG  EKG Interpretation  Date/Time:  Wednesday November 29 2016 10:52:31 EDT Ventricular Rate:  78 PR Interval:    QRS Duration: 97 QT Interval:  414 QTC Calculation: 463 R Axis:   34 Text Interpretation:  Sinus rhythm Atrial premature complex Probable anteroseptal infarct, recent Confirmed by Milton Ferguson [phone]) on 11/29/2016 10:56:09 AM       Radiology Dg Chest [facility] 1 View  Result  Date: 11/29/2016 CLINICAL DATA:  Chest pain. EXAM: PORTABLE CHEST 1 VIEW COMPARISON:  10/30/2016 FINDINGS: Heart size and pulmonary vascularity are within  normal limits considering the AP portable technique. Lungs are clear. No effusions. No acute bone abnormality. Aortic atherosclerosis. IMPRESSION: No acute abnormalities. Aortic atherosclerosis. Electronically Signed   By: Lorriane Shire M.D.   On: 11/29/2016 12:16    Procedures Procedures (including critical care time)  Medications Ordered in ED Medications  HYDROmorphone (DILAUDID) injection 1 mg (not administered)     Initial Impression / Assessment and Plan / ED Course  I have reviewed the triage vital signs and the nursing notes.  Pertinent labs & imaging results that were available during my care of the patient were reviewed by me and considered in my medical decision making (see chart for details).     Patient with atypical chest pain. Labs EKG chest x-ray unremarkable. Patient no longer having pain. He will be discharged home to follow-up with his primary care doctor  Final Clinical Impressions(s) / ED Diagnoses   Final diagnoses:  Atypical chest pain    New Prescriptions New Prescriptions   HYDROCODONE-ACETAMINOPHEN (NORCO/VICODIN) 5-325 MG TABLET    Take 1 tablet by mouth every 6 (six) hours as needed for moderate pain.     Milton Ferguson, MD 11/29/16 1240

## 2016-11-29 NOTE — ED Notes (Signed)
Called daughter and she is aware pt has been discharged and will be in lobby.

## 2016-11-29 NOTE — ED Triage Notes (Signed)
[facility] where he was 2.5 hours into runn and developed substernaOL CHEST PAIN. Pt received 324 Aspirin and nitro x 1 sl whitout relief.

## 2016-12-26 ENCOUNTER — Emergency Department (HOSPITAL_COMMUNITY)
Admission: EM | Admit: 2016-12-26 | Discharge: 2016-12-26 | Disposition: A | Discharge status: Home / Self Care | Payer: BC Managed Care – PPO | Attending: Emergency Medicine | Admitting: Emergency Medicine

## 2016-12-26 ENCOUNTER — Other Ambulatory Visit (HOSPITAL_BASED_OUTPATIENT_CLINIC_OR_DEPARTMENT_OTHER): Payer: Self-pay

## 2016-12-26 ENCOUNTER — Emergency Department (HOSPITAL_COMMUNITY): Payer: BC Managed Care – PPO

## 2016-12-26 ENCOUNTER — Encounter (HOSPITAL_COMMUNITY): Payer: Self-pay

## 2016-12-26 DIAGNOSIS — N186 End stage renal disease: Secondary | ICD-10-CM | POA: Insufficient documentation

## 2016-12-26 DIAGNOSIS — I2510 Atherosclerotic heart disease of native coronary artery without angina pectoris: Secondary | ICD-10-CM | POA: Insufficient documentation

## 2016-12-26 DIAGNOSIS — G4733 Obstructive sleep apnea (adult) (pediatric): Secondary | ICD-10-CM

## 2016-12-26 DIAGNOSIS — M25551 Pain in right hip: Secondary | ICD-10-CM | POA: Insufficient documentation

## 2016-12-26 DIAGNOSIS — I120 Hypertensive chronic kidney disease with stage 5 chronic kidney disease or end stage renal disease: Secondary | ICD-10-CM | POA: Insufficient documentation

## 2016-12-26 MED ORDER — NAPROXEN 500 MG PO TABS: 500.0000 mg | ORAL_TABLET | Freq: Two times a day (BID) | ORAL | 0 refills | Status: DC

## 2016-12-26 MED ORDER — TRAMADOL HCL 50 MG PO TABS
50.0000 mg | ORAL_TABLET | Freq: Once | ORAL | Status: AC
  Administered 2016-12-26: 50 mg via ORAL
  Filled 2016-12-26: qty 1

## 2016-12-26 NOTE — ED Notes (Signed)
Pt walked from triage room to lobby w/o difficulty.

## 2016-12-26 NOTE — Discharge Instructions (Signed)
Please read and follow all provided instructions.  You have been seen today for right hip pain  Tests performed today include: An x-ray of the affected area - does NOT show any broken bones or dislocations.  Vital signs. See below for your results today.   Home care instructions: -- *RICE in the first 24-48 hours after injury:  Rest Ice- Do not apply ice pack directly to your skin, place towel or similar between your skin and ice/ice pack. Apply ice for 20 min, then remove for 40 min while awake Compression- Wear brace, elastic bandage, splint as directed by your provider Elevate affected extremity above the level of your heart when not walking around for the first 24-48 hours   Take naproxen as directed.   Follow-up instructions: Please follow-up with your primary care provider or the provided orthopedic physician (bone specialist) if you continue to have significant pain in 1 week. In this case you may have a more severe injury that requires further care.   Return instructions:  Please return if your toes or feet are numb or tingling, appear gray or blue, or you have severe pain (also elevate the leg and loosen splint or wrap if you were given one) Please return to the Emergency Department if you experience worsening symptoms.  Please return if you have any other emergent concerns. Additional Information:  Your vital signs today were: BP (!) 185/82 (BP Location: Right Arm)    Pulse 84    Temp 98 F (36.7 C) (Oral)    Resp 17    SpO2 98%  If your blood pressure (BP) was elevated above 135/85 this visit, please have this repeated by your doctor within one month. ---------------

## 2016-12-26 NOTE — ED Triage Notes (Addendum)
Pt c/o increasing R hip and leg pain x 3 days.  Pain score 6/10.  Pt has not taken anything for pain.  Denies injury.  Pt reports pain began while "lifting a bag out of his trunk."

## 2016-12-26 NOTE — ED Provider Notes (Signed)
[facility] DEPT Provider Note   CSN: [id number] Arrival date & time: 12/26/16  0855     History   Chief Complaint Chief Complaint  Patient presents with  . Leg Pain  . Hip Pain    HPI Jon Anderson is a 66 y.o. male who presents emergency department today for right hip pain. The patient states that 3 days ago he was carrying groceries out of his truck when he stepped off a curb with his right foot landing an odd fashion causing him to hear a pop in his right hip. The patient noted pain following. He is now having constant pain on the posterior aspect of his right hip that radiates down the back of his leg into his right foot. He has not taken anything for this. He has not applied ice or elevated the leg. He states pain is a 6/10 and is worsened with walking. He has no difficulty walking currently. The patient denies any numbness, tingling, weakness of the lower extremity. He has never had surgery on the hip or prior fractures to the hip. He denies back pain, urinary retention, loss of bowel/bladder function, or saddle anesthesia.    HPI  Past Medical History:  Diagnosis Date  . Anemia of chronic disease   . CAD (coronary artery disease)    a.  Myoview 4/11: EF 53%, no scar or ischemia   c. MV 2012 Nl perfusion, apical thinning.  No ischemia or scar.  EF 49%, appears greater by visual estimate.;  d.  Dob stress echo 12/13:  Negative Dob stress echo. There is no evidence of ischemia.  The LVF is normal. b. Normal cors 2016.  . Carotid stenosis    a. <40% RICA, >98% LICA by duplex 04/1912  . Chronic chest pain    occ  . ESRD (end stage renal disease) on dialysis ([place])   . GERD (gastroesophageal reflux disease)   . HTN (hypertension)    echo 3/10: EF 60%, LAE  . Hyperlipidemia   . Nephrolithiasis    "passed them all"  . Peripheral arterial disease ([place])    a. s/p PTCA to L SFA.  Marland Kitchen Pneumonia   . Restless legs   . Snores    a. presumed OSA, pt has refused sleep eval in  past.  . Type II diabetes mellitus [facility])     Patient Active Problem List   Diagnosis Date Noted  . Chest pain, non-cardiac 10/09/2015  . Acute on chronic renal failure ([place]) 06/16/2014  . Renal failure (ARF), acute on chronic (HCC) 06/16/2014  . Shoulder pain, left 12/15/2013  . Chest pain 12/15/2013  . Claudication ([place]) 12/11/2013  . PVD (peripheral vascular disease) ([place]) 12/11/2013  . Peripheral arterial disease ([place]) 09/30/2013  . Carotid artery disease ([place]) 09/30/2013  . Acute chest pain 11/15/2012  . Left-sided chest wall pain 03/19/2012  . Bruit 09/15/2010  . CAD (coronary artery disease) nonobstructive per cath 2012   . Chronic kidney disease (CKD), stage IV (severe) ([place])   . CHEST PAIN, PRECORDIAL 07/26/2009  . DM (diabetes mellitus) ([place]) 07/22/2009  . Hyperlipidemia LDL goal <70 07/22/2009  . Hypertension 07/22/2009    Past Surgical History:  Procedure Laterality Date  . ANGIOPLASTY / STENTING FEMORAL Left 12/11/2013   dr berry  . AV FISTULA PLACEMENT Left 03/19/2014   Procedure: CREATION OF ARTERIOVENOUS (AV) FISTULA  LEFT UPPER ARM;  Surgeon: Mal Misty, MD;  Location: [facility];  Service: Vascular;  Laterality: Left;  . CARDIAC  CATHETERIZATION  2001 and 2010   . COLONOSCOPY W/ BIOPSIES AND POLYPECTOMY    . COLONOSCOPY WITH PROPOFOL N/A 08/01/2016   Procedure: COLONOSCOPY WITH PROPOFOL;  Surgeon: Carol Ada, MD;  Location: [facility];  Service: Endoscopy;  Laterality: N/A;  . ESOPHAGOGASTRODUODENOSCOPY (EGD) WITH PROPOFOL N/A 08/01/2016   Procedure: ESOPHAGOGASTRODUODENOSCOPY (EGD) WITH PROPOFOL;  Surgeon: Carol Ada, MD;  Location: [facility];  Service: Endoscopy;  Laterality: N/A;  . FOOT FRACTURE SURGERY Right   . FRACTURE SURGERY    . INGUINAL HERNIA REPAIR Left   . LEFT HEART CATHETERIZATION WITH CORONARY ANGIOGRAM N/A 06/22/2014   Procedure: LEFT HEART CATHETERIZATION WITH CORONARY ANGIOGRAM;  Surgeon: Troy Sine, MD;  Location: [facility] CATH LAB;   Service: Cardiovascular;  Laterality: N/A;  . LOWER EXTREMITY ANGIOGRAM Left 12/11/2013   Procedure: LOWER EXTREMITY ANGIOGRAM;  Surgeon: Lorretta Harp, MD;  Location: [facility] CATH LAB;  Service: Cardiovascular;  Laterality: Left;  . TONSILLECTOMY AND ADENOIDECTOMY         Home Medications    Prior to Admission medications   Medication Sig Start Date End Date Taking? Authorizing Provider  aspirin EC 81 MG tablet Take 1 tablet (81 mg total) by mouth daily. 05/28/12  Yes Weaver, Scott T, PA-C  atorvastatin (LIPITOR) 40 MG tablet Take 1 tablet (40 mg total) by mouth daily at 6 PM. 12/13/13  Yes Brett Canales, PA-C  calcium acetate (PHOSLO) 667 MG capsule Take 3 capsules (2,001 mg total) by mouth 3 (three) times daily with meals. Patient taking differently: Take 2,001 mg by mouth 3 (three) times daily with meals. May take 667 -1334 mg with snacks 10/09/15  Yes Short, Noah Delaine, MD  clonazePAM Bobbye [place]) 0.5 MG tablet 0.5-1mg  by mouth at bedtime 09/22/14  Yes [provider]  clopidogrel (PLAVIX) 75 MG tablet Take 1 tablet (75 mg total) by mouth daily with breakfast. 12/13/13  Yes Brett Canales, PA-C  gabapentin (NEURONTIN) 300 MG capsule Take 300 mg by mouth 2 (two) times daily. 09/25/16  Yes [provider]  HYDROcodone-acetaminophen (NORCO/VICODIN) 5-325 MG tablet Take 1 tablet by mouth every 6 (six) hours as needed for moderate pain. 11/29/16  Yes Milton Ferguson, MD  lisinopril (PRINIVIL,ZESTRIL) 20 MG tablet Take 20 mg by mouth at bedtime.   Yes [provider]  multivitamin (RENA-VIT) TABS tablet Take 1 tablet by mouth at bedtime. 06/23/14  Yes Domenic Polite, MD  NOVOLOG 100 UNIT/ML injection 0.75 units per hour via insulin pump per pt - - USE AS DIRECTED (TRITRATE AS NEEDED) *MAX 50 UNITS A DAY* 08/28/14  Yes [provider]  pantoprazole (PROTONIX) 40 MG tablet Take 40 mg by mouth daily.   Yes [provider]  promethazine (PHENERGAN) 25 MG tablet Take 25  mg by mouth every 6 (six) hours as needed for nausea or vomiting.   Yes [provider]  rOPINIRole (REQUIP) 0.5 MG tablet Take 0.5 mg by mouth 2 (two) times daily.    Yes [provider]  tamsulosin (FLOMAX) 0.4 MG CAPS capsule Take 0.4 mg by mouth daily.   Yes [provider]  naproxen (NAPROSYN) 500 MG tablet Take 1 tablet (500 mg total) by mouth 2 (two) times daily. 12/26/16   Reid Regas, Barth Kirks, PA-C  nitroGLYCERIN (NITROSTAT) 0.4 MG SL tablet Place 1 tablet (0.4 mg total) under the tongue every 5 (five) minutes as needed for chest pain. 03/20/12   Barrett, Evelene Croon, PA-C  ondansetron (ZOFRAN) 4 MG tablet Take 1 tablet (4  mg total) by mouth every 6 (six) hours as needed for nausea or vomiting. Patient not taking: Reported on 12/26/2016 06/14/16   Forde Dandy, MD    Family History Family History  Problem Relation Age of Onset  . Heart attack Sister        died @ 34  . Cancer Mother        died @ 54; unknown type  . Diabetes Brother        deceased  . Cirrhosis Father        alcohol related  . Diabetes Father   . Esophageal cancer Neg Hx   . Colon cancer Neg Hx   . Pancreatic cancer Neg Hx   . Stomach cancer Neg Hx     Social History Social History  Substance Use Topics  . Smoking status: Former Smoker    Packs/day: 1.00    Years: 2.00    Types: Cigarettes  . Smokeless tobacco: Never Used     Comment: quit smoking 40 yrs ago  . Alcohol use No     Allergies   Kiwi extract and Tape   Review of Systems Review of Systems  Musculoskeletal: Positive for arthralgias. Negative for back pain, gait problem and joint swelling.  Skin: Negative for color change.  Neurological: Negative for weakness and numbness.     Physical Exam Updated Vital Signs BP (!) 185/82 (BP Location: Right Arm)   Pulse 84   Temp 98 F (36.7 C) (Oral)   Resp 17   SpO2 98%   Physical Exam  Constitutional: He appears well-developed and well-nourished.  HENT:  Head:  Normocephalic and atraumatic.  Right Ear: External ear normal.  Left Ear: External ear normal.  Eyes: Conjunctivae are normal. Right eye exhibits no discharge. Left eye exhibits no discharge. No scleral icterus.  Cardiovascular:  Pulses:      Dorsalis pedis pulses are 2+ on the right side, and 2+ on the left side.       Posterior tibial pulses are 2+ on the right side, and 2+ on the left side.  Pulmonary/Chest: Effort normal. No respiratory distress.  Musculoskeletal:       Right hip: He exhibits tenderness. He exhibits normal range of motion (intact flexion, extension, abduction, adduction, internal and external rotation) and normal strength.       Right knee: Normal. He exhibits normal range of motion and no swelling. No tenderness found.       Right ankle: He exhibits normal range of motion and no swelling. No tenderness. Achilles tendon normal. Achilles tendon exhibits no pain.       Lumbar back: Normal.  No leg shortening or rotation. No sacral crepitus. Negative SLR bilaterally.   Neurological: He is alert. He has normal strength. No sensory deficit. Gait normal.  Skin: Skin is warm and dry. No erythema. No pallor.  Psychiatric: He has a normal mood and affect.  Nursing note and vitals reviewed.  ED Treatments / Results  Labs (all labs ordered are listed, but only abnormal results are displayed) Labs Reviewed - No data to display  EKG  EKG Interpretation None       Radiology Dg Hip Unilat  With Pelvis 2-3 Views Right  Result Date: 12/26/2016 CLINICAL DATA:  Injury right hip.  Right hip pain. EXAM: DG HIP (WITH OR WITHOUT PELVIS) 2-3V RIGHT COMPARISON:  06/02/2016.  06/10/2015. FINDINGS: Degenerative changes lumbar spine and both hips . No evidence of fracture or dislocation. Stable prominent exostosis noted  along the superior pubic symphysis. Diffuse atherosclerotic vascular calcification. Prior hernia repair. IMPRESSION: 1. Degenerative changes lumbar spine and both hips. No  acute abnormality identified. 2. Atherosclerotic vascular disease . Electronically Signed   By: Marcello Moores  Register   On: 12/26/2016 11:21    Procedures Procedures (including critical care time)  Medications Ordered in ED Medications - No data to display   Initial Impression / Assessment and Plan / ED Course  I have reviewed the triage vital signs and the nursing notes.  Pertinent labs & imaging results that were available during my care of the patient were reviewed by me and considered in my medical decision making (see chart for details).     Patient X-Ray negative for obvious fracture or dislocation. Does show arthritic changes. Pain managed in ED. Pt advised to follow up with PCP if symptoms persist for possibility of missed fracture diagnosis. Patient can walk without difficulty in the department. Recommended conservative therapy recommended and discussed return precautions. Patient will be dc home & is agreeable with above plan.   Final Clinical Impressions(s) / ED Diagnoses   Final diagnoses:  Right hip pain    New Prescriptions New Prescriptions   NAPROXEN (NAPROSYN) 500 MG TABLET    Take 1 tablet (500 mg total) by mouth 2 (two) times daily.     Lorelle Gibbs 12/26/16 1543    Lacretia Leigh, MD 12/27/16 1116

## 2016-12-28 ENCOUNTER — Ambulatory Visit (INDEPENDENT_AMBULATORY_CARE_PROVIDER_SITE_OTHER): Payer: BC Managed Care – PPO | Admitting: Orthopaedic Surgery

## 2016-12-28 DIAGNOSIS — M1611 Unilateral primary osteoarthritis, right hip: Secondary | ICD-10-CM | POA: Diagnosis not present

## 2016-12-28 NOTE — Progress Notes (Signed)
Office Visit Note   Patient: Jon Anderson           Date of Birth: May 15, 1950           MRN: [id number] Visit Date: 12/28/2016              Requested by: [name], MD [id number] [place], [place] [address] PCP: [name], MD   Assessment & Plan: Visit Diagnoses:  1. Primary osteoarthritis of right hip     Plan: Overall impression is right hip osteoarthritis flareup. We will schedule Jon Anderson for an intra-articular steroid injection with Dr. Ernestina Anderson. Follow-up as needed. Questions encouraged and answered.  Follow-Up Instructions: Return if symptoms worsen or fail to improve.   Orders:  No orders of the defined types were placed in this encounter.  No orders of the defined types were placed in this encounter.     Procedures: No procedures performed   Clinical Data: No additional findings.   Subjective: Chief Complaint  Patient presents with  . Right Hip - Pain    Patient is a 66-year-old gentleman who comes in with right hip and groin pain since Saturday. He denies any injuries. Denies any numbness and tingling. The pain is worse with weightbearing and ambulation. Denies any back pain.    Review of Systems  Constitutional: Negative.   All other systems reviewed and are negative.    Objective: Vital Signs: There were no vitals taken for this visit.  Physical Exam  Constitutional: He is oriented to person, place, and time. He appears well-developed and well-nourished.  HENT:  Head: Normocephalic and atraumatic.  Eyes: Pupils are equal, round, and reactive to light.  Neck: Neck supple.  Pulmonary/Chest: Effort normal.  Abdominal: Soft.  Musculoskeletal: Normal range of motion.  Neurological: He is alert and oriented to person, place, and time.  Skin: Skin is warm.  Psychiatric: He has a normal mood and affect. His behavior is normal. Judgment and thought content normal.  Nursing note and vitals reviewed.   Ortho Exam Right hip exam  is symptomatically with rotation. Positive Stinchfield sign. No sciatic tension signs. Lateral hip is nontender. SI joint is nontender. Specialty Comments:  No specialty comments available.  Imaging: No results found.   PMFS History: Patient Active Problem List   Diagnosis Date Noted  . Chest pain, non-cardiac 10/09/2015  . Acute on chronic renal failure ([place]) 06/16/2014  . Renal failure (ARF), acute on chronic (HCC) 06/16/2014  . Shoulder pain, left 12/15/2013  . Chest pain 12/15/2013  . Claudication ([place]) 12/11/2013  . PVD (peripheral vascular disease) ([place]) 12/11/2013  . Peripheral arterial disease ([place]) 09/30/2013  . Carotid artery disease ([place]) 09/30/2013  . Acute chest pain 11/15/2012  . Left-sided chest wall pain 03/19/2012  . Bruit 09/15/2010  . CAD (coronary artery disease) nonobstructive per cath 2012   . Chronic kidney disease (CKD), stage IV (severe) ([place])   . CHEST PAIN, PRECORDIAL 07/26/2009  . DM (diabetes mellitus) ([place]) 07/22/2009  . Hyperlipidemia LDL goal <70 07/22/2009  . Hypertension 07/22/2009   Past Medical History:  Diagnosis Date  . Anemia of chronic disease   . CAD (coronary artery disease)    a.  Myoview 4/11: EF 53%, no scar or ischemia   c. MV 2012 Nl perfusion, apical thinning.  No ischemia or scar.  EF 49%, appears greater by visual estimate.;  d.  Dob stress echo 12/13:  Negative Dob stress echo. There is no evidence of ischemia.  The  LVF is normal. b. Normal cors 2016.  . Carotid stenosis    a. <30% RICA, >07% LICA by duplex 09/2261  . Chronic chest pain    occ  . ESRD (end stage renal disease) on dialysis ([place])   . GERD (gastroesophageal reflux disease)   . HTN (hypertension)    echo 3/10: EF 60%, LAE  . Hyperlipidemia   . Nephrolithiasis    "passed them all"  . Peripheral arterial disease ([place])    a. s/p PTCA to L SFA.  Marland Kitchen Pneumonia   . Restless legs   . Snores    a. presumed OSA, pt has refused sleep eval in past.  . Type II  diabetes mellitus ([place])     Family History  Problem Relation Age of Onset  . Heart attack Sister        died @ 39  . Cancer Mother        died @ 65; unknown type  . Diabetes Brother        deceased  . Cirrhosis Father        alcohol related  . Diabetes Father   . Esophageal cancer Neg Hx   . Colon cancer Neg Hx   . Pancreatic cancer Neg Hx   . Stomach cancer Neg Hx     Past Surgical History:  Procedure Laterality Date  . ANGIOPLASTY / STENTING FEMORAL Left 12/11/2013   dr berry  . AV FISTULA PLACEMENT Left 03/19/2014   Procedure: CREATION OF ARTERIOVENOUS (AV) FISTULA  LEFT UPPER ARM;  Surgeon: Mal Misty, MD;  Location: [facility];  Service: Vascular;  Laterality: Left;  . CARDIAC CATHETERIZATION  2001 and 2010   . COLONOSCOPY W/ BIOPSIES AND POLYPECTOMY    . COLONOSCOPY WITH PROPOFOL N/A 08/01/2016   Procedure: COLONOSCOPY WITH PROPOFOL;  Surgeon: Carol Ada, MD;  [facility];  Service: Endoscopy;  Laterality: N/A;  . ESOPHAGOGASTRODUODENOSCOPY (EGD) WITH PROPOFOL N/A 08/01/2016   Procedure: ESOPHAGOGASTRODUODENOSCOPY (EGD) WITH PROPOFOL;  Surgeon: Carol Ada, MD;  [facility];  Service: Endoscopy;  Laterality: N/A;  . FOOT FRACTURE SURGERY Right   . FRACTURE SURGERY    . INGUINAL HERNIA REPAIR Left   . LEFT HEART CATHETERIZATION WITH CORONARY ANGIOGRAM N/A 06/22/2014   Procedure: LEFT HEART CATHETERIZATION WITH CORONARY ANGIOGRAM;  Surgeon: Troy Sine, MD;  Location: [facility] CATH LAB;  Service: Cardiovascular;  Laterality: N/A;  . LOWER EXTREMITY ANGIOGRAM Left 12/11/2013   Procedure: LOWER EXTREMITY ANGIOGRAM;  Surgeon: Lorretta Harp, MD;  Location: [facility] CATH LAB;  Service: Cardiovascular;  Laterality: Left;  . TONSILLECTOMY AND ADENOIDECTOMY     Social History   Occupational History  . Not on file.   Social History Main Topics  . Smoking status: Former Smoker    Packs/day: 1.00    Years: 2.00    Types: Cigarettes  . Smokeless tobacco: Never  Used     Comment: quit smoking 40 yrs ago  . Alcohol use No  . Drug use: No  . Sexual activity: Yes    Birth control/ protection: None

## 2017-01-02 ENCOUNTER — Telehealth (INDEPENDENT_AMBULATORY_CARE_PROVIDER_SITE_OTHER): Payer: Self-pay

## 2017-01-02 ENCOUNTER — Encounter: Payer: Self-pay | Admitting: Podiatry

## 2017-01-02 ENCOUNTER — Ambulatory Visit (INDEPENDENT_AMBULATORY_CARE_PROVIDER_SITE_OTHER): Payer: Medicare Other | Admitting: Podiatry

## 2017-01-02 ENCOUNTER — Emergency Department (HOSPITAL_COMMUNITY)
Admission: EM | Admit: 2017-01-02 | Discharge: 2017-01-02 | Disposition: A | Discharge status: Home / Self Care | Payer: BC Managed Care – PPO | Attending: Emergency Medicine | Admitting: Emergency Medicine

## 2017-01-02 ENCOUNTER — Encounter (HOSPITAL_COMMUNITY): Payer: Self-pay | Admitting: Emergency Medicine

## 2017-01-02 ENCOUNTER — Ambulatory Visit (INDEPENDENT_AMBULATORY_CARE_PROVIDER_SITE_OTHER): Payer: Medicare Other

## 2017-01-02 DIAGNOSIS — I2510 Atherosclerotic heart disease of native coronary artery without angina pectoris: Secondary | ICD-10-CM | POA: Diagnosis not present

## 2017-01-02 DIAGNOSIS — E1122 Type 2 diabetes mellitus with diabetic chronic kidney disease: Secondary | ICD-10-CM | POA: Diagnosis not present

## 2017-01-02 DIAGNOSIS — M5431 Sciatica, right side: Secondary | ICD-10-CM

## 2017-01-02 DIAGNOSIS — N186 End stage renal disease: Secondary | ICD-10-CM | POA: Insufficient documentation

## 2017-01-02 DIAGNOSIS — Z794 Long term (current) use of insulin: Secondary | ICD-10-CM | POA: Diagnosis not present

## 2017-01-02 DIAGNOSIS — Z7982 Long term (current) use of aspirin: Secondary | ICD-10-CM | POA: Insufficient documentation

## 2017-01-02 DIAGNOSIS — M779 Enthesopathy, unspecified: Secondary | ICD-10-CM

## 2017-01-02 DIAGNOSIS — M79604 Pain in right leg: Secondary | ICD-10-CM

## 2017-01-02 DIAGNOSIS — Z7901 Long term (current) use of anticoagulants: Secondary | ICD-10-CM | POA: Diagnosis not present

## 2017-01-02 DIAGNOSIS — Z992 Dependence on renal dialysis: Secondary | ICD-10-CM | POA: Insufficient documentation

## 2017-01-02 DIAGNOSIS — Z79899 Other long term (current) drug therapy: Secondary | ICD-10-CM | POA: Insufficient documentation

## 2017-01-02 DIAGNOSIS — M7750 Other enthesopathy of unspecified foot: Secondary | ICD-10-CM | POA: Diagnosis not present

## 2017-01-02 DIAGNOSIS — Q828 Other specified congenital malformations of skin: Secondary | ICD-10-CM

## 2017-01-02 DIAGNOSIS — M25551 Pain in right hip: Secondary | ICD-10-CM | POA: Diagnosis present

## 2017-01-02 DIAGNOSIS — I120 Hypertensive chronic kidney disease with stage 5 chronic kidney disease or end stage renal disease: Secondary | ICD-10-CM | POA: Diagnosis not present

## 2017-01-02 DIAGNOSIS — Z87891 Personal history of nicotine dependence: Secondary | ICD-10-CM | POA: Diagnosis not present

## 2017-01-02 MED ORDER — OXYCODONE-ACETAMINOPHEN 5-325 MG PO TABS: 1.0000 | ORAL_TABLET | Freq: Four times a day (QID) | ORAL | 0 refills | Status: DC | PRN

## 2017-01-02 NOTE — ED Triage Notes (Signed)
Pt c/o right hip pain that radiates down the right leg. Pt seen at [facility] and diagnosed with arthritis, supposed to follow-up with orthopedic MD for cortisone shot, but unable to get appointment until next Monday.

## 2017-01-02 NOTE — Telephone Encounter (Signed)
See message below °

## 2017-01-02 NOTE — Telephone Encounter (Signed)
We can try to get her into see newton asap.  In terms of what to do today, if her pain is that severe then she may want to go to an urgent care or ED

## 2017-01-02 NOTE — Telephone Encounter (Signed)
Patients wife Stanton Kidney called about the severe amount of pain patient is experiencing in his hip. She states he saw Dr Erlinda Hong for his hip pain and he tried to get patient worked in to see Dr Ernestina Patches for hip injection but due to Dr Newtons schedule was unable to be seen. She wants to know what she is to do about the severe pain her husband is experiencing. She said she was concerned about his BP going up so high due to the pain.  I assure her that we would let Dr Erlinda Hong know and see what his suggestions were but in the meantime if she was fearful of his hypertension may want to proceed to ER. Please call her back to discuss [phone]

## 2017-01-02 NOTE — Discharge Instructions (Signed)
STOP Naproxen and Hydrocodone Start Percocet. You can take an Extra strength Tylenol in between doses for extra pain relief.  Follow up with Orthopedics

## 2017-01-02 NOTE — ED Notes (Signed)
See edp assessment

## 2017-01-02 NOTE — Telephone Encounter (Signed)
States they prefer to go to the hospital.(FYI)

## 2017-01-02 NOTE — ED Provider Notes (Signed)
[facility] DEPT Provider Note   CSN: [id number] Arrival date & time: 01/02/17  1645     History   Chief Complaint Chief Complaint  Patient presents with  . Hip Pain    HPI Jon Anderson is a 66 y.o. male who presents with right hip pain and leg pain. PMH significant for non-obstructive CAD, ESRD on dialysis, HTN, carotid stenosis, PAD, Type 2 DM. He states that the pain started in his hip when he stepped off a curb last week. He went to [facility] and had an xray which showed arthritis. He follow up with Orthopedics who plans on an intra-articular injection but were not able to do this until this upcoming Monday (9/30). He states the pain is in his buttocks and over the anterior hip. It sometimes radiates to the foot with walking. Bending his leg and not walking make the pain better. He has been taking Naproxen and Norco (prescribed by his PCP) without significant relief. He was advised to come to the ED due to significant pain and he is concerned because he wants to go out of town this weekend. He denies back pain, numbness, tingling, weakness.  HPI  Past Medical History:  Diagnosis Date  . Anemia of chronic disease   . CAD (coronary artery disease)    a.  Myoview 4/11: EF 53%, no scar or ischemia   c. MV 2012 Nl perfusion, apical thinning.  No ischemia or scar.  EF 49%, appears greater by visual estimate.;  d.  Dob stress echo 12/13:  Negative Dob stress echo. There is no evidence of ischemia.  The LVF is normal. b. Normal cors 2016.  . Carotid stenosis    a. <62% RICA, >69% LICA by duplex 07/8544  . Chronic chest pain    occ  . ESRD (end stage renal disease) on dialysis ([place])   . GERD (gastroesophageal reflux disease)   . HTN (hypertension)    echo 3/10: EF 60%, LAE  . Hyperlipidemia   . Nephrolithiasis    "passed them all"  . Peripheral arterial disease ([place])    a. s/p PTCA to L SFA.  Marland Kitchen Pneumonia   . Restless legs   . Snores    a. presumed OSA, pt has refused sleep eval  in past.  . Type II diabetes mellitus [facility])     Patient Active Problem List   Diagnosis Date Noted  . Chest pain, non-cardiac 10/09/2015  . Acute on chronic renal failure ([place]) 06/16/2014  . Renal failure (ARF), acute on chronic (HCC) 06/16/2014  . Shoulder pain, left 12/15/2013  . Chest pain 12/15/2013  . Claudication ([place]) 12/11/2013  . PVD (peripheral vascular disease) ([place]) 12/11/2013  . Peripheral arterial disease ([place]) 09/30/2013  . Carotid artery disease ([place]) 09/30/2013  . Acute chest pain 11/15/2012  . Left-sided chest wall pain 03/19/2012  . Bruit 09/15/2010  . CAD (coronary artery disease) nonobstructive per cath 2012   . Chronic kidney disease (CKD), stage IV (severe) ([place])   . CHEST PAIN, PRECORDIAL 07/26/2009  . DM (diabetes mellitus) ([place]) 07/22/2009  . Hyperlipidemia LDL goal <70 07/22/2009  . Hypertension 07/22/2009    Past Surgical History:  Procedure Laterality Date  . ANGIOPLASTY / STENTING FEMORAL Left 12/11/2013   dr berry  . AV FISTULA PLACEMENT Left 03/19/2014   Procedure: CREATION OF ARTERIOVENOUS (AV) FISTULA  LEFT UPPER ARM;  Surgeon: Mal Misty, MD;  Location: [facility];  Service: Vascular;  Laterality: Left;  . CARDIAC CATHETERIZATION  2001  and 2010   . COLONOSCOPY W/ BIOPSIES AND POLYPECTOMY    . COLONOSCOPY WITH PROPOFOL N/A 08/01/2016   Procedure: COLONOSCOPY WITH PROPOFOL;  Surgeon: Carol Ada, MD;  Location: [facility]OS[facility];  Service: Endoscopy;  Laterality: N/A;  . ESOPHAGOGASTRODUODENOSCOPY (EGD) WITH PROPOFOL N/A 08/01/2016   Procedure: ESOPHAGOGASTRODUODENOSCOPY (EGD) WITH PROPOFOL;  Surgeon: Carol Ada, MD;  Location: [facility];  Service: Endoscopy;  Laterality: N/A;  . FOOT FRACTURE SURGERY Right   . FRACTURE SURGERY    . INGUINAL HERNIA REPAIR Left   . LEFT HEART CATHETERIZATION WITH CORONARY ANGIOGRAM N/A 06/22/2014   Procedure: LEFT HEART CATHETERIZATION WITH CORONARY ANGIOGRAM;  Surgeon: Troy Sine, MD;  Location: [facility] CATH LAB;   Service: Cardiovascular;  Laterality: N/A;  . LOWER EXTREMITY ANGIOGRAM Left 12/11/2013   Procedure: LOWER EXTREMITY ANGIOGRAM;  Surgeon: Lorretta Harp, MD;  Location: [facility] CATH LAB;  Service: Cardiovascular;  Laterality: Left;  . TONSILLECTOMY AND ADENOIDECTOMY         Home Medications    Prior to Admission medications   Medication Sig Start Date End Date Taking? Authorizing Provider  aspirin EC 81 MG tablet Take 1 tablet (81 mg total) by mouth daily. 05/28/12   Richardson Dopp T, PA-C  atorvastatin (LIPITOR) 40 MG tablet Take 1 tablet (40 mg total) by mouth daily at 6 PM. 12/13/13   Brett Canales, PA-C  calcium acetate (PHOSLO) 667 MG capsule Take 3 capsules (2,001 mg total) by mouth 3 (three) times daily with meals. Patient taking differently: Take 2,001 mg by mouth 3 (three) times daily with meals. May take 667 -1334 mg with snacks 10/09/15   Janece Canterbury, MD  clonazePAM Bobbye [place]) 0.5 MG tablet 0.5-1mg  by mouth at bedtime 09/22/14   [provider]  clopidogrel (PLAVIX) 75 MG tablet Take 1 tablet (75 mg total) by mouth daily with breakfast. 12/13/13   Brett Canales, PA-C  gabapentin (NEURONTIN) 300 MG capsule Take 300 mg by mouth 2 (two) times daily. 09/25/16   [provider]  HYDROcodone-acetaminophen (NORCO/VICODIN) 5-325 MG tablet Take 1 tablet by mouth every 6 (six) hours as needed for moderate pain. 11/29/16   Milton Ferguson, MD  lisinopril (PRINIVIL,ZESTRIL) 20 MG tablet Take 20 mg by mouth at bedtime.    [provider]  multivitamin (RENA-VIT) TABS tablet Take 1 tablet by mouth at bedtime. 06/23/14   Domenic Polite, MD  naproxen (NAPROSYN) 500 MG tablet Take 1 tablet (500 mg total) by mouth 2 (two) times daily. 12/26/16   Maczis, Barth Kirks, PA-C  nitroGLYCERIN (NITROSTAT) 0.4 MG SL tablet Place 1 tablet (0.4 mg total) under the tongue every 5 (five) minutes as needed for chest pain. 03/20/12   Barrett, Evelene Croon, PA-C  NOVOLOG 100 UNIT/ML injection 0.75 units  per hour via insulin pump per pt - - USE AS DIRECTED (TRITRATE AS NEEDED) *MAX 50 UNITS A DAY* 08/28/14   [provider]  ondansetron (ZOFRAN) 4 MG tablet Take 1 tablet (4 mg total) by mouth every 6 (six) hours as needed for nausea or vomiting. 06/14/16   Forde Dandy, MD  pantoprazole (PROTONIX) 40 MG tablet Take 40 mg by mouth daily.    [provider]  promethazine (PHENERGAN) 25 MG tablet Take 25 mg by mouth every 6 (six) hours as needed for nausea or vomiting.    [provider]  rOPINIRole (REQUIP) 0.5 MG tablet Take 0.5 mg by mouth 2 (two) times daily.     [provider]  tamsulosin (FLOMAX) 0.4 MG CAPS capsule Take 0.4 mg by mouth daily.    [provider]    Family History Family History  Problem Relation Age of Onset  . Heart attack Sister        died @ 74  . Cancer Mother        died @ 14; unknown type  . Diabetes Brother        deceased  . Cirrhosis Father        alcohol related  . Diabetes Father   . Esophageal cancer Neg Hx   . Colon cancer Neg Hx   . Pancreatic cancer Neg Hx   . Stomach cancer Neg Hx     Social History Social History  Substance Use Topics  . Smoking status: Former Smoker    Packs/day: 1.00    Years: 2.00    Types: Cigarettes  . Smokeless tobacco: Never Used     Comment: quit smoking 40 yrs ago  . Alcohol use No     Allergies   Kiwi extract and Tape   Review of Systems Review of Systems  Musculoskeletal: Positive for arthralgias and gait problem. Negative for back pain.  Neurological: Negative for weakness and numbness.     Physical Exam Updated Vital Signs BP (!) 141/72 (BP Location: Right Arm)   Pulse 82   Temp 98.5 F (36.9 C) (Oral)   Resp 16   Ht 5\' 11"  (1.803 m)   Wt 83.9 kg (185 lb)   SpO2 93%   BMI 25.80 kg/m   Physical Exam  Constitutional: He is oriented to person, place, and time. He appears well-developed and well-nourished. No distress.  HENT:  Head: Normocephalic  and atraumatic.  Eyes: Pupils are equal, round, and reactive to light. Conjunctivae are normal. Right eye exhibits no discharge. Left eye exhibits no discharge. No scleral icterus.  Neck: Normal range of motion.  Cardiovascular: Normal rate.   Pulmonary/Chest: Effort normal. No respiratory distress.  Abdominal: He exhibits no distension.  Musculoskeletal:  No back tenderness  Right hip: Pain over gluteal muscle and anterior hip with palpation. Pain with ROM of hip. 2+ DP pulse. Foot is warm. Ambulatory.   Neurological: He is alert and oriented to person, place, and time.  Skin: Skin is warm and dry.  Psychiatric: He has a normal mood and affect. His behavior is normal.  Nursing note and vitals reviewed.    ED Treatments / Results  Labs (all labs ordered are listed, but only abnormal results are displayed) Labs Reviewed - No data to display  EKG  EKG Interpretation None       Radiology Dg Foot Complete Right  Result Date: 01/02/2017 Please see detailed radiograph report in office note.   Procedures Procedures (including critical care time)  Medications Ordered in ED Medications - No data to display   Initial Impression / Assessment and Plan / ED Course  I have reviewed the triage vital signs and the nursing notes.  Pertinent labs & imaging results that were available during my care of the patient were reviewed by me and considered in my medical decision making (see chart for details).  66 year old male with persistent, uncontrolled pain due to injury last week. Pain is mostly in the hip but some of his pain sounds somewhat radicular since he has pain all the way to his foot. He also has PAD but foot is warm and he has 2+ peripheral pulses. He has been taking Naproxen and  Norco with minimal relief. Pain options are limited due to his ESRD. I advised him to stop Naproxen and Norco. Will rx short course of Percocet until he can see Orthopedics on Monday. I advised him not  to drive for at least 6-8 hours after taking this medicine and limit the amount as much as possible. Return precautions given.  Final Clinical Impressions(s) / ED Diagnoses   Final diagnoses:  Right hip pain  Right leg pain    New Prescriptions New Prescriptions   No medications on file     Iris Pert 01/02/17 Kai Levins, MD 01/04/17 1600
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
## 2017-01-08 ENCOUNTER — Ambulatory Visit (INDEPENDENT_AMBULATORY_CARE_PROVIDER_SITE_OTHER): Payer: BC Managed Care – PPO | Admitting: Physical Medicine and Rehabilitation

## 2017-01-08 ENCOUNTER — Encounter (INDEPENDENT_AMBULATORY_CARE_PROVIDER_SITE_OTHER): Payer: Self-pay | Admitting: Physical Medicine and Rehabilitation

## 2017-01-08 ENCOUNTER — Ambulatory Visit (INDEPENDENT_AMBULATORY_CARE_PROVIDER_SITE_OTHER): Payer: Medicare Other

## 2017-01-08 DIAGNOSIS — M25551 Pain in right hip: Secondary | ICD-10-CM

## 2017-01-08 MED ORDER — BUPIVACAINE HCL 0.5 % IJ SOLN
3.0000 mL | INTRAMUSCULAR | Status: AC | PRN
  Administered 2017-01-08: 3 mL via INTRA_ARTICULAR

## 2017-01-08 MED ORDER — TRIAMCINOLONE ACETONIDE 40 MG/ML IJ SUSP
80.0000 mg | INTRAMUSCULAR | Status: AC | PRN
  Administered 2017-01-08: 80 mg via INTRA_ARTICULAR

## 2017-01-08 NOTE — Progress Notes (Signed)
Jon Anderson - 66 y.o. male MRN [id number]  Date of birth: 1950/11/11  Office Visit Note: Visit Date: 01/08/2017 PCP: Jon Panda, MD Referred by: Jon Panda, MD  Subjective: Chief Complaint  Patient presents with  . Right Hip - Pain   HPI: Jon Anderson is a 66 year old gentleman with severe right hip pain. He reports severe pain in the right hip and lateral right thigh. Occasionally pain to the foot. He has some right groin pain at times but does not his biggest issue. He is worse in the mornings and worse with standing. He says is difficult to put pressure on his right foot in the mornings. He has been taking Norco for pain relief. He does ambulate with a cane in the right hand. He has had evaluation by Jon Anderson requested intra-articular diagnostic hip arthrogram. He also recently went to the emergency room because of severe hip pain. He has not had any lumbar spine imaging.    ROS Otherwise per HPI.  Assessment & Plan: Visit Diagnoses:  1. Pain in right hip     Plan: Findings:  Diagnostic and hopefully therapeutic right hip anesthetic arthrogram. Patient did have some mild relief of his thigh pain but not his pain to the foot.    Meds & Orders: No orders of the defined types were placed in this encounter.   Orders Placed This Encounter  Procedures  . Large Joint Injection/Arthrocentesis  . XR C-ARM NO REPORT    Follow-up: Return if symptoms worsen or fail to improve, for Jon Anderson.   Procedures: Right hip diagnostic and therapeutic anesthetic arthrogram Date/Time: 01/08/2017 1:58 PM Performed by: Jon Anderson Authorized by: Jon Anderson   Consent Given by:  Patient Site marked: the procedure site was marked   Timeout: prior to procedure the correct patient, procedure, and site was verified   Indications:  Pain and diagnostic evaluation Location:  Hip Site:  R hip joint Prep: patient was prepped and draped in usual sterile fashion   Needle Size:  22  G Approach:  Anterior Ultrasound Guidance: No   Fluoroscopic Guidance: No   Arthrogram: Yes   Medications:  3 mL bupivacaine 0.5 %; 80 mg triamcinolone acetonide 40 MG/ML Aspiration Attempted: Yes   Patient tolerance:  Patient tolerated the procedure well with no immediate complications  Arthrogram demonstrated excellent flow of contrast throughout the joint surface without extravasation or obvious defect.  The patient had SOME relief of symptoms during the anesthetic phase of the injection.      No notes on file   Clinical History: No specialty comments available.  He reports that he has quit smoking. His smoking use included Cigarettes. He has a 2.00 pack-year smoking history. He has never used smokeless tobacco. No results for input(s): HGBA1C, LABURIC in the last 8760 hours.  Objective:  VS:  HT:    WT:   BMI:     BP:   HR: bpm  TEMP: ( )  RESP:  Physical Exam  Musculoskeletal:  Patient ambulates with an antalgic gait to the right with a cane. He has a very stiff right hip with some pain at end ranges.    Ortho Exam Imaging: Xr C-arm No Report  Result Date: 01/08/2017 Please see Notes or Procedures tab for imaging impression.   Past Medical/Family/Surgical/Social History: Medications & Allergies reviewed per EMR Patient Active Problem List   Diagnosis Date Noted  . Chest pain, non-cardiac 10/09/2015  . Acute on chronic renal failure ([place]) 06/16/2014  .  Renal failure (ARF), acute on chronic (HCC) 06/16/2014  . Shoulder pain, left 12/15/2013  . Chest pain 12/15/2013  . Claudication ([place]) 12/11/2013  . PVD (peripheral vascular disease) ([place]) 12/11/2013  . Peripheral arterial disease ([place]) 09/30/2013  . Carotid artery disease ([place]) 09/30/2013  . Acute chest pain 11/15/2012  . Left-sided chest wall pain 03/19/2012  . Bruit 09/15/2010  . CAD (coronary artery disease) nonobstructive per cath 2012   . Chronic kidney disease (CKD), stage IV (severe) ([place])   . CHEST  PAIN, PRECORDIAL 07/26/2009  . DM (diabetes mellitus) ([place]) 07/22/2009  . Hyperlipidemia LDL goal <70 07/22/2009  . Hypertension 07/22/2009   Past Medical History:  Diagnosis Date  . Anemia of chronic disease   . CAD (coronary artery disease)    a.  Myoview 4/11: EF 53%, no scar or ischemia   c. MV 2012 Nl perfusion, apical thinning.  No ischemia or scar.  EF 49%, appears greater by visual estimate.;  d.  Dob stress echo 12/13:  Negative Dob stress echo. There is no evidence of ischemia.  The LVF is normal. b. Normal cors 2016.  . Carotid stenosis    a. <45% RICA, >62% LICA by duplex 08/6387  . Chronic chest pain    occ  . ESRD (end stage renal disease) on dialysis ([place])   . GERD (gastroesophageal reflux disease)   . HTN (hypertension)    echo 3/10: EF 60%, LAE  . Hyperlipidemia   . Nephrolithiasis    "passed them all"  . Peripheral arterial disease ([place])    a. s/p PTCA to L SFA.  Marland Kitchen Pneumonia   . Restless legs   . Snores    a. presumed OSA, pt has refused sleep eval in past.  . Type II diabetes mellitus ([place])    Family History  Problem Relation Age of Onset  . Heart attack Sister        died @ 13  . Cancer Mother        died @ 78; unknown type  . Diabetes Brother        deceased  . Cirrhosis Father        alcohol related  . Diabetes Father   . Esophageal cancer Neg Hx   . Colon cancer Neg Hx   . Pancreatic cancer Neg Hx   . Stomach cancer Neg Hx    Past Surgical History:  Procedure Laterality Date  . ANGIOPLASTY / STENTING FEMORAL Left 12/11/2013   dr berry  . AV FISTULA PLACEMENT Left 03/19/2014   Procedure: CREATION OF ARTERIOVENOUS (AV) FISTULA  LEFT UPPER ARM;  Surgeon: Jon Misty, MD;  Location: [facility];  Service: Vascular;  Laterality: Left;  . CARDIAC CATHETERIZATION  2001 and 2010   . COLONOSCOPY W/ BIOPSIES AND POLYPECTOMY    . COLONOSCOPY WITH PROPOFOL N/A 08/01/2016   Procedure: COLONOSCOPY WITH PROPOFOL;  Surgeon: Jon Ada, MD;  [facility]  ENDOS[facility];  Service: Endoscopy;  Laterality: N/A;  . ESOPHAGOGASTRODUODENOSCOPY (EGD) WITH PROPOFOL N/A 08/01/2016   Procedure: ESOPHAGOGASTRODUODENOSCOPY (EGD) WITH PROPOFOL;  Surgeon: Jon Ada, MD;  [facility];  Service: Endoscopy;  Laterality: N/A;  . FOOT FRACTURE SURGERY Right   . FRACTURE SURGERY    . INGUINAL HERNIA REPAIR Left   . LEFT HEART CATHETERIZATION WITH CORONARY ANGIOGRAM N/A 06/22/2014   Procedure: LEFT HEART CATHETERIZATION WITH CORONARY ANGIOGRAM;  Surgeon: Troy Sine, MD;  Location: [facility] CATH LAB;  Service: Cardiovascular;  Laterality: N/A;  . LOWER EXTREMITY  ANGIOGRAM Left 12/11/2013   Procedure: LOWER EXTREMITY ANGIOGRAM;  Surgeon: Lorretta Harp, MD;  Location: [facility] CATH LAB;  Service: Cardiovascular;  Laterality: Left;  . TONSILLECTOMY AND ADENOIDECTOMY     Social History   Occupational History  . Not on file.   Social History Main Topics  . Smoking status: Former Smoker    Packs/day: 1.00    Years: 2.00    Types: Cigarettes  . Smokeless tobacco: Never Used     Comment: quit smoking 40 yrs ago  . Alcohol use No  . Drug use: No  . Sexual activity: Yes    Birth control/ protection: None

## 2017-01-08 NOTE — Patient Instructions (Signed)

## 2017-01-08 NOTE — Progress Notes (Deleted)
Right leg pain, in the lateral right thigh, and in the foot.  He does have some right groin pain.  Worse in the mornings.  Difficult to put pressure on the right foot in the mornings.  Taking norco for pain.  Walks with a cane right hand.

## 2017-01-16 ENCOUNTER — Encounter (INDEPENDENT_AMBULATORY_CARE_PROVIDER_SITE_OTHER): Payer: Self-pay | Admitting: Orthopaedic Surgery

## 2017-01-16 ENCOUNTER — Ambulatory Visit (INDEPENDENT_AMBULATORY_CARE_PROVIDER_SITE_OTHER): Payer: BC Managed Care – PPO | Admitting: Orthopaedic Surgery

## 2017-01-16 ENCOUNTER — Telehealth (INDEPENDENT_AMBULATORY_CARE_PROVIDER_SITE_OTHER): Payer: Self-pay | Admitting: Orthopaedic Surgery

## 2017-01-16 DIAGNOSIS — M5416 Radiculopathy, lumbar region: Secondary | ICD-10-CM | POA: Diagnosis not present

## 2017-01-16 MED ORDER — HYDROCODONE-ACETAMINOPHEN 5-325 MG PO TABS: 1.0000 | ORAL_TABLET | Freq: Every day | ORAL | 0 refills | Status: DC | PRN

## 2017-01-16 NOTE — Progress Notes (Signed)
Office Visit Note   Patient: Jon Anderson           Date of Birth: 04-20-1950           MRN: [id number] Visit Date: 01/16/2017              Requested by: [name], MD [address] [place],  [id number] PCP: [name], MD   Assessment & Plan: Visit Diagnoses:  1. Lumbar radiculopathy     Plan: Patient likely has lumbar radiculopathy. We will send him a lumbar spine MRI to rule out structural abnormalities. He is supposed to have a CABG soon.  We will refer him to Dr. Ernestina Patches for epidural steroid injection once he has his MRI.  Follow-Up Instructions: Return if symptoms worsen or fail to improve.   Orders:  Orders Placed This Encounter  Procedures  . MR Lumbar Spine w/o contrast   Meds ordered this encounter  Medications  . HYDROcodone-acetaminophen (NORCO) 5-325 MG tablet    Sig: Take 1-2 tablets by mouth daily as needed.    Dispense:  20 tablet    Refill:  0      Procedures: No procedures performed   Clinical Data: No additional findings.   Subjective: Chief Complaint  Patient presents with  . Right Hip - Follow-up    Patient is following up today for his continued right hip pain and leg pain. Cortisone injection with Dr. Ernestina Patches did not provide him with any significant relief. He still has continued pain traveling down his right leg. He denies any numbness and tingling. He is is ambulating with a cane currently    Review of Systems  Constitutional: Negative.   All other systems reviewed and are negative.    Objective: Vital Signs: There were no vitals taken for this visit.  Physical Exam  Constitutional: He is oriented to person, place, and time. He appears well-developed and well-nourished.  Pulmonary/Chest: Effort normal.  Abdominal: Soft.  Neurological: He is alert and oriented to person, place, and time.  Skin: Skin is warm.  Psychiatric: He has a normal mood and affect. His behavior is normal. Judgment and thought content  normal.  Nursing note and vitals reviewed.   Ortho Exam Right hip and lumbar spine exam are stable Specialty Comments:  No specialty comments available.  Imaging: No results found.   PMFS History: Patient Active Problem List   Diagnosis Date Noted  . Lumbar radiculopathy 01/16/2017  . Chest pain, non-cardiac 10/09/2015  . Acute on chronic renal failure ([place]) 06/16/2014  . Renal failure (ARF), acute on chronic (HCC) 06/16/2014  . Shoulder pain, left 12/15/2013  . Chest pain 12/15/2013  . Claudication ([place]) 12/11/2013  . PVD (peripheral vascular disease) ([place]) 12/11/2013  . Peripheral arterial disease ([place] [place]) 09/30/2013  . Carotid artery disease ([place]) 09/30/2013  . Acute chest pain 11/15/2012  . Left-sided chest wall pain 03/19/2012  . Bruit 09/15/2010  . CAD (coronary artery disease) nonobstructive per cath 2012   . Chronic kidney disease (CKD), stage IV (severe) ([place])   . CHEST PAIN, PRECORDIAL 07/26/2009  . DM (diabetes mellitus) ([place]) 07/22/2009  . Hyperlipidemia LDL goal <70 07/22/2009  . Hypertension 07/22/2009   Past Medical History:  Diagnosis Date  . Anemia of chronic disease   . CAD (coronary artery disease)    a.  Myoview 4/11: EF 53%, no scar or ischemia   c. MV 2012 Nl perfusion, apical thinning.  No ischemia or scar.  EF 49%, appears greater by  visual estimate.;  d.  Dob stress echo 12/13:  Negative Dob stress echo. There is no evidence of ischemia.  The LVF is normal. b. Normal cors 2016.  . Carotid stenosis    a. <35% RICA, >46% LICA by duplex 08/6810  . Chronic chest pain    occ  . ESRD (end stage renal disease) on dialysis ([place])   . GERD (gastroesophageal reflux disease)   . HTN (hypertension)    echo 3/10: EF 60%, LAE  . Hyperlipidemia   . Nephrolithiasis    "passed them all"  . Peripheral arterial disease ([place])    a. s/p PTCA to L SFA.  Marland Kitchen Pneumonia   . Restless legs   . Snores    a. presumed OSA, pt has refused sleep eval in past.  . Type II  diabetes mellitus ([place])     Family History  Problem Relation Age of Onset  . Heart attack Sister        died @ 76  . Cancer Mother        died @ 64; unknown type  . Diabetes Brother        deceased  . Cirrhosis Father        alcohol related  . Diabetes Father   . Esophageal cancer Neg Hx   . Colon cancer Neg Hx   . Pancreatic cancer Neg Hx   . Stomach cancer Neg Hx     Past Surgical History:  Procedure Laterality Date  . ANGIOPLASTY / STENTING FEMORAL Left 12/11/2013   dr berry  . AV FISTULA PLACEMENT Left 03/19/2014   Procedure: CREATION OF ARTERIOVENOUS (AV) FISTULA  LEFT UPPER ARM;  Surgeon: Mal Misty, MD;  Location: [facility];  Service: Vascular;  Laterality: Left;  . CARDIAC CATHETERIZATION  2001 and 2010   . COLONOSCOPY W/ BIOPSIES AND POLYPECTOMY    . COLONOSCOPY WITH PROPOFOL N/A 08/01/2016   Procedure: COLONOSCOPY WITH PROPOFOL;  Surgeon: Carol Ada, MD;  Location: [facility];  Service: Endoscopy;  Laterality: N/A;  . ESOPHAGOGASTRODUODENOSCOPY (EGD) WITH PROPOFOL N/A 08/01/2016   Procedure: ESOPHAGOGASTRODUODENOSCOPY (EGD) WITH PROPOFOL;  Surgeon: Carol Ada, MD;  Location: [facility];  Service: Endoscopy;  Laterality: N/A;  . FOOT FRACTURE SURGERY Right   . FRACTURE SURGERY    . INGUINAL HERNIA REPAIR Left   . LEFT HEART CATHETERIZATION WITH CORONARY ANGIOGRAM N/A 06/22/2014   Procedure: LEFT HEART CATHETERIZATION WITH CORONARY ANGIOGRAM;  Surgeon: Troy Sine, MD;  Location: [facility] CATH LAB;  Service: Cardiovascular;  Laterality: N/A;  . LOWER EXTREMITY ANGIOGRAM Left 12/11/2013   Procedure: LOWER EXTREMITY ANGIOGRAM;  Surgeon: Lorretta Harp, MD;  Location: [facility] CATH LAB;  Service: Cardiovascular;  Laterality: Left;  . TONSILLECTOMY AND ADENOIDECTOMY     Social History   Occupational History  . Not on file.   Social History Main Topics  . Smoking status: Former Smoker    Packs/day: 1.00    Years: 2.00    Types: Cigarettes  . Smokeless tobacco: Never  Used     Comment: quit smoking 40 yrs ago  . Alcohol use No  . Drug use: No  . Sexual activity: Yes    Birth control/ protection: None

## 2017-01-16 NOTE — Telephone Encounter (Signed)
See message.

## 2017-01-16 NOTE — Telephone Encounter (Signed)
Patient request a call regarding the severe right foot pain he has been having since the cortisone injection he was given.

## 2017-01-18 ENCOUNTER — Other Ambulatory Visit: Payer: BC Managed Care – PPO

## 2017-01-19 ENCOUNTER — Ambulatory Visit
Admission: RE | Admit: 2017-01-19 | Discharge: 2017-01-19 | Disposition: A | Discharge status: Home / Self Care | Payer: Medicare Other | Source: Ambulatory Visit | Attending: Orthopaedic Surgery | Admitting: Orthopaedic Surgery

## 2017-01-19 DIAGNOSIS — M5416 Radiculopathy, lumbar region: Secondary | ICD-10-CM

## 2017-01-20 NOTE — Progress Notes (Signed)
Please refer to Dr. Kathyrn Sheriff asap for MRI findings of large disc herniation.  Needs surgery

## 2017-01-22 ENCOUNTER — Other Ambulatory Visit (INDEPENDENT_AMBULATORY_CARE_PROVIDER_SITE_OTHER): Payer: Self-pay

## 2017-01-22 DIAGNOSIS — M5126 Other intervertebral disc displacement, lumbar region: Secondary | ICD-10-CM

## 2017-01-23 ENCOUNTER — Telehealth (INDEPENDENT_AMBULATORY_CARE_PROVIDER_SITE_OTHER): Payer: Self-pay | Admitting: Orthopaedic Surgery

## 2017-01-23 ENCOUNTER — Encounter (INDEPENDENT_AMBULATORY_CARE_PROVIDER_SITE_OTHER): Payer: Self-pay | Admitting: Orthopaedic Surgery

## 2017-01-23 ENCOUNTER — Ambulatory Visit (INDEPENDENT_AMBULATORY_CARE_PROVIDER_SITE_OTHER): Payer: BC Managed Care – PPO | Admitting: Orthopaedic Surgery

## 2017-01-23 DIAGNOSIS — M5416 Radiculopathy, lumbar region: Secondary | ICD-10-CM

## 2017-01-23 MED ORDER — HYDROCODONE-ACETAMINOPHEN 7.5-325 MG PO TABS: 1.0000 | ORAL_TABLET | Freq: Every day | ORAL | 0 refills | Status: DC | PRN

## 2017-01-23 NOTE — Telephone Encounter (Signed)
Look into this.

## 2017-01-23 NOTE — Progress Notes (Signed)
Office Visit Note   Patient: Jon Anderson           Date of Birth: [birth year]           MRN: [id number] Visit Date: 01/23/2017              Requested by: [name], MD [address], [place] [address] PCP: [name], MD   Assessment & Plan: Visit Diagnoses:  1. Lumbar radiculopathy     Plan: MRI shows a large extruded disc at L5-S1 on the right side with significant stenosis. We will make an urgent referral to neurosurgery for surgical evaluation. Patient is supposed to undergo CABG in the next week or 2 which may affect the timing of his back surgery.  Follow-Up Instructions: Return if symptoms worsen or fail to improve.   Orders:  Orders Placed This Encounter  Procedures  . Ambulatory referral to Neurosurgery   Meds ordered this encounter  Medications  . HYDROcodone-acetaminophen (NORCO) 7.5-325 MG tablet    Sig: Take 1-2 tablets by mouth daily as needed for moderate pain.    Dispense:  30 tablet    Refill:  0      Procedures: No procedures performed   Clinical Data: No additional findings.   Subjective: Chief Complaint  Patient presents with  . Lower Back - Follow-up    Patient follows up today for his MRI of his lumbar spine. He continues to have significant right leg pain and weakness. He is walking with a cane.    Review of Systems  Constitutional: Negative.   All other systems reviewed and are negative.    Objective: Vital Signs: There were no vitals taken for this visit.  Physical Exam  Constitutional: He is oriented to person, place, and time. He appears well-developed and well-nourished.  Pulmonary/Chest: Effort normal.  Abdominal: Soft.  Neurological: He is alert and oriented to person, place, and time.  Skin: Skin is warm.  Psychiatric: He has a normal mood and affect. His behavior is normal. Judgment and thought content normal.  Nursing note and vitals reviewed.   Ortho Exam Right lower extremity exam shows mild  weakness of ankle dorsiflexion and plantarflexion. Mild weakness of EHL. Specialty Comments:  No specialty comments available.  Imaging: No results found.   PMFS History: Patient Active Problem List   Diagnosis Date Noted  . Lumbar radiculopathy 01/16/2017  . Chest pain, non-cardiac 10/09/2015  . Acute on chronic renal failure ([place]) 06/16/2014  . Renal failure (ARF), acute on chronic (HCC) 06/16/2014  . Shoulder pain, left 12/15/2013  . Chest pain 12/15/2013  . Claudication ([place]) 12/11/2013  . PVD (peripheral vascular disease) ([place]) 12/11/2013  . Peripheral arterial disease ([place]) 09/30/2013  . Carotid artery disease ([place]) 09/30/2013  . Acute chest pain 11/15/2012  . Left-sided chest wall pain 03/19/2012  . Bruit 09/15/2010  . CAD (coronary artery disease) nonobstructive per cath 2012   . Chronic kidney disease (CKD), stage IV (severe) ([place])   . CHEST PAIN, PRECORDIAL 07/26/2009  . DM (diabetes mellitus) ([place]) 07/22/2009  . Hyperlipidemia LDL goal <70 07/22/2009  . Hypertension 07/22/2009   Past Medical History:  Diagnosis Date  . Anemia of chronic disease   . CAD (coronary artery disease)    a.  Myoview 4/11: EF 53%, no scar or ischemia   c. MV 2012 Nl perfusion, apical thinning.  No ischemia or scar.  EF 49%, appears greater by visual estimate.;  d.  Dob stress echo 12/13:  Negative Dob stress echo. There is no evidence of ischemia.  The LVF is normal. b. Normal cors 2016.  . Carotid stenosis    a. <12% RICA, >24% LICA by duplex 11/2498  . Chronic chest pain    occ  . ESRD (end stage renal disease) on dialysis ([place])   . GERD (gastroesophageal reflux disease)   . HTN (hypertension)    echo 3/10: EF 60%, LAE  . Hyperlipidemia   . Nephrolithiasis    "passed them all"  . Peripheral arterial disease ([place])    a. s/p PTCA to L SFA.  Marland Kitchen Pneumonia   . Restless legs   . Snores    a. presumed OSA, pt has refused sleep eval in past.  . Type II diabetes mellitus ([place])       Family History  Problem Relation Age of Onset  . Heart attack Sister        died @ 50  . Cancer Mother        died @ 47; unknown type  . Diabetes Brother        deceased  . Cirrhosis Father        alcohol related  . Diabetes Father   . Esophageal cancer Neg Hx   . Colon cancer Neg Hx   . Pancreatic cancer Neg Hx   . Stomach cancer Neg Hx     Past Surgical History:  Procedure Laterality Date  . ANGIOPLASTY / STENTING FEMORAL Left 12/11/2013   dr berry  . AV FISTULA PLACEMENT Left 03/19/2014   Procedure: CREATION OF ARTERIOVENOUS (AV) FISTULA  LEFT UPPER ARM;  Surgeon: Mal Misty, MD;  Location: [facility];  Service: Vascular;  Laterality: Left;  . CARDIAC CATHETERIZATION  2001 and 2010   . COLONOSCOPY W/ BIOPSIES AND POLYPECTOMY    . COLONOSCOPY WITH PROPOFOL N/A 08/01/2016   Procedure: COLONOSCOPY WITH PROPOFOL;  Surgeon: Carol Ada, MD;  [facility];  Service: Endoscopy;  Laterality: N/A;  . ESOPHAGOGASTRODUODENOSCOPY (EGD) WITH PROPOFOL N/A 08/01/2016   Procedure: ESOPHAGOGASTRODUODENOSCOPY (EGD) WITH PROPOFOL;  Surgeon: Carol Ada, MD;  [facility];  Service: Endoscopy;  Laterality: N/A;  . FOOT FRACTURE SURGERY Right   . FRACTURE SURGERY    . INGUINAL HERNIA REPAIR Left   . LEFT HEART CATHETERIZATION WITH CORONARY ANGIOGRAM N/A 06/22/2014   Procedure: LEFT HEART CATHETERIZATION WITH CORONARY ANGIOGRAM;  Surgeon: Troy Sine, MD;  Location: [facility] CATH LAB;  Service: Cardiovascular;  Laterality: N/A;  . LOWER EXTREMITY ANGIOGRAM Left 12/11/2013   Procedure: LOWER EXTREMITY ANGIOGRAM;  Surgeon: Lorretta Harp, MD;  Location: [facility] CATH LAB;  Service: Cardiovascular;  Laterality: Left;  . TONSILLECTOMY AND ADENOIDECTOMY     Social History   Occupational History  . Not on file.   Social History Main Topics  . Smoking status: Former Smoker    Packs/day: 1.00    Years: 2.00    Types: Cigarettes  . Smokeless tobacco: Never Used     Comment: quit  smoking 40 yrs ago  . Alcohol use No  . Drug use: No  . Sexual activity: Yes    Birth control/ protection: None

## 2017-01-23 NOTE — Telephone Encounter (Signed)
Patient called in regards to getting injection in the back. Wants a returned call so he will know what to do.

## 2017-01-23 NOTE — Telephone Encounter (Signed)
After discussing with Dr Erlinda Hong. IC s/w patient and advised needed to see neurosurgeon.

## 2017-02-03 ENCOUNTER — Encounter (HOSPITAL_BASED_OUTPATIENT_CLINIC_OR_DEPARTMENT_OTHER): Payer: BC Managed Care – PPO

## 2017-02-22 ENCOUNTER — Telehealth (INDEPENDENT_AMBULATORY_CARE_PROVIDER_SITE_OTHER): Payer: Self-pay | Admitting: Orthopaedic Surgery

## 2017-02-22 ENCOUNTER — Ambulatory Visit (INDEPENDENT_AMBULATORY_CARE_PROVIDER_SITE_OTHER): Payer: Medicare Other | Admitting: Podiatry

## 2017-02-22 ENCOUNTER — Encounter: Payer: Self-pay | Admitting: Podiatry

## 2017-02-22 DIAGNOSIS — Q828 Other specified congenital malformations of skin: Secondary | ICD-10-CM | POA: Diagnosis not present

## 2017-02-22 DIAGNOSIS — M79676 Pain in unspecified toe(s): Secondary | ICD-10-CM | POA: Diagnosis not present

## 2017-02-22 DIAGNOSIS — B351 Tinea unguium: Secondary | ICD-10-CM | POA: Diagnosis not present

## 2017-02-22 DIAGNOSIS — E1142 Type 2 diabetes mellitus with diabetic polyneuropathy: Secondary | ICD-10-CM

## 2017-02-22 NOTE — Telephone Encounter (Signed)
See message below.  What would you like to prescribe?

## 2017-02-22 NOTE — Telephone Encounter (Signed)
Tramadol No. 30

## 2017-02-22 NOTE — Telephone Encounter (Signed)
Patient called needing Rx refilled for pain. The number to contact patient is [phone]

## 2017-02-22 NOTE — Progress Notes (Signed)
He presents today she complained of painful elongated toenails and calluses. History of diabetic polyneuropathy.  Objective: Pulses remain palpable. Toenails elongated, dystrophic like mycotic multiple soft tissue lesions benign in nature plantar aspect of the bilateral foot. No open lesions or wounds.  Assessment: Benign soft tissue lesions bilateral. Patient and limb seen in onychomycosis.  Plan: Debrided nails and calluses bilateral.

## 2017-02-23 ENCOUNTER — Telehealth (HOSPITAL_COMMUNITY): Payer: Self-pay

## 2017-02-23 MED ORDER — TRAMADOL HCL 50 MG PO TABS: 50.0000 mg | ORAL_TABLET | Freq: Two times a day (BID) | ORAL | 0 refills | Status: DC | PRN

## 2017-02-23 NOTE — Telephone Encounter (Signed)
IC patient and advised Rx called in to pharmacy.

## 2017-02-23 NOTE — Telephone Encounter (Signed)
Please call.

## 2017-02-23 NOTE — Telephone Encounter (Signed)
Patients insurance is active and benefits verified through Medicare Part A & B - no co-pay, deductible amount of $183.00/$183.00 has been met, no out of pocket, 20% co-insurance, and no pre-authorization is required. Passport/reference [id number]  Patients insurance is active and benefits verified through [place] - No co-pay, no deductible, no out of pocket, no co-insurance, and no pre-authorization is required. Passport/reference [phone]

## 2017-02-23 NOTE — Addendum Note (Signed)
Addended by: Brand Males E on: 02/23/2017 04:50 PM   Modules accepted: Orders

## 2017-03-17 ENCOUNTER — Inpatient Hospital Stay (HOSPITAL_COMMUNITY)
Admission: EM | Admit: 2017-03-17 | Discharge: 2017-03-22 | DRG: 291 | Disposition: A | Discharge status: Home Health | Payer: Medicare Other | Attending: Internal Medicine | Admitting: Internal Medicine

## 2017-03-17 ENCOUNTER — Emergency Department (HOSPITAL_COMMUNITY): Payer: Medicare Other

## 2017-03-17 ENCOUNTER — Other Ambulatory Visit: Payer: Self-pay

## 2017-03-17 ENCOUNTER — Encounter (HOSPITAL_COMMUNITY): Payer: Self-pay | Admitting: Emergency Medicine

## 2017-03-17 DIAGNOSIS — Z992 Dependence on renal dialysis: Secondary | ICD-10-CM

## 2017-03-17 DIAGNOSIS — Z7902 Long term (current) use of antithrombotics/antiplatelets: Secondary | ICD-10-CM

## 2017-03-17 DIAGNOSIS — Z794 Long term (current) use of insulin: Secondary | ICD-10-CM | POA: Diagnosis not present

## 2017-03-17 DIAGNOSIS — N186 End stage renal disease: Secondary | ICD-10-CM | POA: Diagnosis present

## 2017-03-17 DIAGNOSIS — G2581 Restless legs syndrome: Secondary | ICD-10-CM | POA: Diagnosis present

## 2017-03-17 DIAGNOSIS — E119 Type 2 diabetes mellitus without complications: Secondary | ICD-10-CM

## 2017-03-17 DIAGNOSIS — D649 Anemia, unspecified: Secondary | ICD-10-CM | POA: Diagnosis present

## 2017-03-17 DIAGNOSIS — E1151 Type 2 diabetes mellitus with diabetic peripheral angiopathy without gangrene: Secondary | ICD-10-CM | POA: Diagnosis present

## 2017-03-17 DIAGNOSIS — J90 Pleural effusion, not elsewhere classified: Secondary | ICD-10-CM | POA: Diagnosis present

## 2017-03-17 DIAGNOSIS — Z8249 Family history of ischemic heart disease and other diseases of the circulatory system: Secondary | ICD-10-CM

## 2017-03-17 DIAGNOSIS — R0789 Other chest pain: Secondary | ICD-10-CM | POA: Diagnosis present

## 2017-03-17 DIAGNOSIS — I5033 Acute on chronic diastolic (congestive) heart failure: Secondary | ICD-10-CM | POA: Diagnosis present

## 2017-03-17 DIAGNOSIS — R042 Hemoptysis: Secondary | ICD-10-CM | POA: Diagnosis present

## 2017-03-17 DIAGNOSIS — J189 Pneumonia, unspecified organism: Secondary | ICD-10-CM | POA: Diagnosis present

## 2017-03-17 DIAGNOSIS — Y95 Nosocomial condition: Secondary | ICD-10-CM | POA: Diagnosis present

## 2017-03-17 DIAGNOSIS — N2581 Secondary hyperparathyroidism of renal origin: Secondary | ICD-10-CM | POA: Diagnosis present

## 2017-03-17 DIAGNOSIS — Z87891 Personal history of nicotine dependence: Secondary | ICD-10-CM

## 2017-03-17 DIAGNOSIS — G4733 Obstructive sleep apnea (adult) (pediatric): Secondary | ICD-10-CM | POA: Diagnosis present

## 2017-03-17 DIAGNOSIS — G8929 Other chronic pain: Secondary | ICD-10-CM | POA: Diagnosis present

## 2017-03-17 DIAGNOSIS — D638 Anemia in other chronic diseases classified elsewhere: Secondary | ICD-10-CM | POA: Diagnosis present

## 2017-03-17 DIAGNOSIS — E1122 Type 2 diabetes mellitus with diabetic chronic kidney disease: Secondary | ICD-10-CM | POA: Diagnosis present

## 2017-03-17 DIAGNOSIS — Z9641 Presence of insulin pump (external) (internal): Secondary | ICD-10-CM | POA: Diagnosis present

## 2017-03-17 DIAGNOSIS — E782 Mixed hyperlipidemia: Secondary | ICD-10-CM | POA: Diagnosis not present

## 2017-03-17 DIAGNOSIS — I2510 Atherosclerotic heart disease of native coronary artery without angina pectoris: Secondary | ICD-10-CM | POA: Diagnosis present

## 2017-03-17 DIAGNOSIS — R079 Chest pain, unspecified: Secondary | ICD-10-CM | POA: Diagnosis not present

## 2017-03-17 DIAGNOSIS — I509 Heart failure, unspecified: Secondary | ICD-10-CM | POA: Diagnosis not present

## 2017-03-17 DIAGNOSIS — E785 Hyperlipidemia, unspecified: Secondary | ICD-10-CM | POA: Diagnosis present

## 2017-03-17 DIAGNOSIS — R0600 Dyspnea, unspecified: Secondary | ICD-10-CM

## 2017-03-17 DIAGNOSIS — Z9861 Coronary angioplasty status: Secondary | ICD-10-CM

## 2017-03-17 DIAGNOSIS — I132 Hypertensive heart and chronic kidney disease with heart failure and with stage 5 chronic kidney disease, or end stage renal disease: Principal | ICD-10-CM | POA: Diagnosis present

## 2017-03-17 DIAGNOSIS — Z91018 Allergy to other foods: Secondary | ICD-10-CM

## 2017-03-17 DIAGNOSIS — E8889 Other specified metabolic disorders: Secondary | ICD-10-CM | POA: Diagnosis present

## 2017-03-17 DIAGNOSIS — M5117 Intervertebral disc disorders with radiculopathy, lumbosacral region: Secondary | ICD-10-CM | POA: Diagnosis present

## 2017-03-17 DIAGNOSIS — Z91048 Other nonmedicinal substance allergy status: Secondary | ICD-10-CM

## 2017-03-17 DIAGNOSIS — K219 Gastro-esophageal reflux disease without esophagitis: Secondary | ICD-10-CM | POA: Diagnosis present

## 2017-03-17 DIAGNOSIS — Z79891 Long term (current) use of opiate analgesic: Secondary | ICD-10-CM

## 2017-03-17 DIAGNOSIS — Z79899 Other long term (current) drug therapy: Secondary | ICD-10-CM

## 2017-03-17 DIAGNOSIS — Z833 Family history of diabetes mellitus: Secondary | ICD-10-CM

## 2017-03-17 DIAGNOSIS — Z7982 Long term (current) use of aspirin: Secondary | ICD-10-CM

## 2017-03-17 DIAGNOSIS — G92 Toxic encephalopathy: Secondary | ICD-10-CM | POA: Diagnosis present

## 2017-03-17 DIAGNOSIS — R072 Precordial pain: Secondary | ICD-10-CM | POA: Diagnosis not present

## 2017-03-17 DIAGNOSIS — Z791 Long term (current) use of non-steroidal anti-inflammatories (NSAID): Secondary | ICD-10-CM

## 2017-03-17 DIAGNOSIS — Z951 Presence of aortocoronary bypass graft: Secondary | ICD-10-CM

## 2017-03-17 DIAGNOSIS — I272 Pulmonary hypertension, unspecified: Secondary | ICD-10-CM | POA: Diagnosis present

## 2017-03-17 DIAGNOSIS — I10 Essential (primary) hypertension: Secondary | ICD-10-CM | POA: Diagnosis not present

## 2017-03-17 DIAGNOSIS — Z9111 Patient's noncompliance with dietary regimen: Secondary | ICD-10-CM

## 2017-03-17 DIAGNOSIS — J9811 Atelectasis: Secondary | ICD-10-CM | POA: Diagnosis present

## 2017-03-17 DIAGNOSIS — D631 Anemia in chronic kidney disease: Secondary | ICD-10-CM | POA: Diagnosis present

## 2017-03-17 DIAGNOSIS — Z95820 Peripheral vascular angioplasty status with implants and grafts: Secondary | ICD-10-CM

## 2017-03-17 DIAGNOSIS — Z87442 Personal history of urinary calculi: Secondary | ICD-10-CM

## 2017-03-17 LAB — HEMOGLOBIN A1C
Hgb A1c MFr Bld: 4.8 % (ref 4.8–5.6)
Mean Plasma Glucose: 91.06 mg/dL

## 2017-03-17 LAB — BASIC METABOLIC PANEL
Anion gap: 11 (ref 5–15)
BUN: 12 mg/dL (ref 6–20)
CO2: 29 mmol/L (ref 22–32)
Calcium: 10.5 mg/dL — ABNORMAL HIGH (ref 8.9–10.3)
Chloride: 94 mmol/L — ABNORMAL LOW (ref 101–111)
Creatinine, Ser: 6.58 mg/dL — ABNORMAL HIGH (ref 0.61–1.24)
GFR calc Af Amer: 9 mL/min — ABNORMAL LOW (ref >60)
GFR calc non Af Amer: 8 mL/min — ABNORMAL LOW (ref >60)
Glucose, Bld: 106 mg/dL — ABNORMAL HIGH (ref 65–99)
Potassium: 3.2 mmol/L — ABNORMAL LOW (ref 3.5–5.1)
Sodium: 134 mmol/L — ABNORMAL LOW (ref 135–145)

## 2017-03-17 LAB — I-STAT CHEM 8, ED
BUN: 13 mg/dL (ref 6–20)
Calcium, Ion: 1.26 mmol/L (ref 1.15–1.40)
Chloride: 95 mmol/L — ABNORMAL LOW (ref 101–111)
Creatinine, Ser: 6.3 mg/dL — ABNORMAL HIGH (ref 0.61–1.24)
Glucose, Bld: 87 mg/dL (ref 65–99)
HCT: 44 % (ref 39.0–52.0)
Hemoglobin: 15 g/dL (ref 13.0–17.0)
Potassium: 3.1 mmol/L — ABNORMAL LOW (ref 3.5–5.1)
Sodium: 138 mmol/L (ref 135–145)
TCO2: 32 mmol/L (ref 22–32)

## 2017-03-17 LAB — CBC
HCT: 39.8 % (ref 39.0–52.0)
Hemoglobin: 12.6 g/dL — ABNORMAL LOW (ref 13.0–17.0)
MCH: 29.4 pg (ref 26.0–34.0)
MCHC: 31.7 g/dL (ref 30.0–36.0)
MCV: 93 fL (ref 78.0–100.0)
Platelets: 208 10*3/uL (ref 150–400)
RBC: 4.28 MIL/uL (ref 4.22–5.81)
RDW: 16.3 % — ABNORMAL HIGH (ref 11.5–15.5)
WBC: 9.1 10*3/uL (ref 4.0–10.5)

## 2017-03-17 LAB — BRAIN NATRIURETIC PEPTIDE: B Natriuretic Peptide: 3394.7 pg/mL — ABNORMAL HIGH (ref 0.0–100.0)

## 2017-03-17 LAB — I-STAT TROPONIN, ED: Troponin i, poc: 0.04 ng/mL (ref 0.00–0.08)

## 2017-03-17 LAB — TROPONIN I
Troponin I: 0.06 ng/mL (ref <0.03)
Troponin I: 0.07 ng/mL (ref <0.03)
Troponin I: 0.07 ng/mL (ref <0.03)

## 2017-03-17 MED ORDER — CALCIUM ACETATE (PHOS BINDER) 667 MG PO CAPS
2001.0000 mg | ORAL_CAPSULE | Freq: Three times a day (TID) | ORAL | Status: DC
  Administered 2017-03-17 – 2017-03-18 (×4): 2001 mg via ORAL
  Filled 2017-03-17 (×4): qty 3

## 2017-03-17 MED ORDER — HEPARIN SODIUM (PORCINE) 1000 UNIT/ML DIALYSIS: 1000.0000 [IU] | INTRAMUSCULAR | Status: DC | PRN

## 2017-03-17 MED ORDER — INSULIN PUMP
Freq: Three times a day (TID) | SUBCUTANEOUS | Status: DC
  Filled 2017-03-17: qty 1

## 2017-03-17 MED ORDER — INSULIN ASPART 100 UNIT/ML ~~LOC~~ SOLN: 0.0000 [IU] | SUBCUTANEOUS | Status: DC

## 2017-03-17 MED ORDER — MORPHINE SULFATE (PF) 4 MG/ML IV SOLN
4.0000 mg | Freq: Once | INTRAVENOUS | Status: AC
  Administered 2017-03-17: 4 mg via INTRAVENOUS
  Filled 2017-03-17: qty 1

## 2017-03-17 MED ORDER — METOPROLOL TARTRATE 25 MG PO TABS
25.0000 mg | ORAL_TABLET | Freq: Once | ORAL | Status: AC
  Administered 2017-03-17: 25 mg via ORAL
  Filled 2017-03-17: qty 1

## 2017-03-17 MED ORDER — ROPINIROLE HCL 0.5 MG PO TABS
0.5000 mg | ORAL_TABLET | Freq: Every day | ORAL | Status: DC
  Administered 2017-03-18: 0.5 mg via ORAL
  Filled 2017-03-17: qty 1

## 2017-03-17 MED ORDER — ATORVASTATIN CALCIUM 40 MG PO TABS
40.0000 mg | ORAL_TABLET | Freq: Every day | ORAL | Status: DC
  Administered 2017-03-17 – 2017-03-21 (×5): 40 mg via ORAL
  Filled 2017-03-17 (×5): qty 1

## 2017-03-17 MED ORDER — SODIUM CHLORIDE 0.9 % IV SOLN: 100.0000 mL | INTRAVENOUS | Status: DC | PRN

## 2017-03-17 MED ORDER — LIDOCAINE HCL (PF) 1 % IJ SOLN: 5.0000 mL | INTRAMUSCULAR | Status: DC | PRN

## 2017-03-17 MED ORDER — ASPIRIN 81 MG PO CHEW
324.0000 mg | CHEWABLE_TABLET | Freq: Once | ORAL | Status: AC
  Administered 2017-03-17: 324 mg via ORAL
  Filled 2017-03-17: qty 4

## 2017-03-17 MED ORDER — CLOPIDOGREL BISULFATE 75 MG PO TABS
75.0000 mg | ORAL_TABLET | Freq: Every day | ORAL | Status: DC
  Administered 2017-03-18 – 2017-03-22 (×5): 75 mg via ORAL
  Filled 2017-03-17 (×5): qty 1

## 2017-03-17 MED ORDER — HEPARIN SODIUM (PORCINE) 1000 UNIT/ML DIALYSIS: 3000.0000 [IU] | INTRAMUSCULAR | Status: DC | PRN

## 2017-03-17 MED ORDER — LISINOPRIL 20 MG PO TABS
20.0000 mg | ORAL_TABLET | Freq: Once | ORAL | Status: AC
  Administered 2017-03-17: 20 mg via ORAL
  Filled 2017-03-17: qty 1

## 2017-03-17 MED ORDER — ACETAMINOPHEN 325 MG PO TABS
650.0000 mg | ORAL_TABLET | ORAL | Status: DC | PRN
  Administered 2017-03-19 – 2017-03-22 (×6): 650 mg via ORAL
  Filled 2017-03-17 (×6): qty 2

## 2017-03-17 MED ORDER — CLONAZEPAM 0.5 MG PO TABS
1.0000 mg | ORAL_TABLET | Freq: Every evening | ORAL | Status: DC | PRN
  Administered 2017-03-18: 1 mg via ORAL
  Filled 2017-03-17: qty 2

## 2017-03-17 MED ORDER — PANTOPRAZOLE SODIUM 40 MG PO TBEC
40.0000 mg | DELAYED_RELEASE_TABLET | Freq: Every day | ORAL | Status: DC
  Administered 2017-03-18 – 2017-03-21 (×4): 40 mg via ORAL
  Filled 2017-03-17 (×4): qty 1

## 2017-03-17 MED ORDER — HYDROCODONE-ACETAMINOPHEN 5-325 MG PO TABS
ORAL_TABLET | ORAL | Status: AC
  Administered 2017-03-17: 1 via ORAL
  Filled 2017-03-17: qty 1

## 2017-03-17 MED ORDER — PENTAFLUOROPROP-TETRAFLUOROETH EX AERO: 1.0000 "application " | INHALATION_SPRAY | CUTANEOUS | Status: DC | PRN

## 2017-03-17 MED ORDER — NITROGLYCERIN 0.4 MG SL SUBL
0.4000 mg | SUBLINGUAL_TABLET | SUBLINGUAL | Status: AC | PRN
  Administered 2017-03-21 (×3): 0.4 mg via SUBLINGUAL
  Filled 2017-03-17 (×2): qty 1

## 2017-03-17 MED ORDER — RENA-VITE PO TABS
1.0000 | ORAL_TABLET | Freq: Every day | ORAL | Status: DC
  Administered 2017-03-18 – 2017-03-21 (×5): 1 via ORAL
  Filled 2017-03-17 (×5): qty 1

## 2017-03-17 MED ORDER — HEPARIN SODIUM (PORCINE) 5000 UNIT/ML IJ SOLN
5000.0000 [IU] | Freq: Three times a day (TID) | INTRAMUSCULAR | Status: DC
  Administered 2017-03-18 – 2017-03-22 (×13): 5000 [IU] via SUBCUTANEOUS
  Filled 2017-03-17 (×13): qty 1

## 2017-03-17 MED ORDER — CYCLOBENZAPRINE HCL 10 MG PO TABS
10.0000 mg | ORAL_TABLET | Freq: Three times a day (TID) | ORAL | Status: DC | PRN
  Administered 2017-03-18: 10 mg via ORAL
  Filled 2017-03-17: qty 1

## 2017-03-17 MED ORDER — LIDOCAINE-PRILOCAINE 2.5-2.5 % EX CREA: 1.0000 "application " | TOPICAL_CREAM | CUTANEOUS | Status: DC | PRN

## 2017-03-17 MED ORDER — GABAPENTIN 300 MG PO CAPS
300.0000 mg | ORAL_CAPSULE | Freq: Two times a day (BID) | ORAL | Status: DC
  Administered 2017-03-17 – 2017-03-18 (×3): 300 mg via ORAL
  Filled 2017-03-17 (×3): qty 1

## 2017-03-17 MED ORDER — LISINOPRIL 20 MG PO TABS
20.0000 mg | ORAL_TABLET | Freq: Every day | ORAL | Status: DC
  Administered 2017-03-18: 20 mg via ORAL
  Filled 2017-03-17: qty 1

## 2017-03-17 MED ORDER — HYDROCODONE-ACETAMINOPHEN 5-325 MG PO TABS
1.0000 | ORAL_TABLET | Freq: Every day | ORAL | Status: DC | PRN
Start: 2017-03-17 — End: 2017-03-18
  Administered 2017-03-17: 1 via ORAL

## 2017-03-17 MED ORDER — ASPIRIN EC 81 MG PO TBEC
81.0000 mg | DELAYED_RELEASE_TABLET | Freq: Every day | ORAL | Status: DC
  Administered 2017-03-18 – 2017-03-20 (×3): 81 mg via ORAL
  Filled 2017-03-17 (×3): qty 1

## 2017-03-17 MED ORDER — METOPROLOL TARTRATE 12.5 MG HALF TABLET
12.5000 mg | ORAL_TABLET | Freq: Two times a day (BID) | ORAL | Status: DC
  Administered 2017-03-18 – 2017-03-20 (×6): 12.5 mg via ORAL
  Filled 2017-03-17 (×6): qty 1

## 2017-03-17 MED ORDER — ONDANSETRON HCL 4 MG/2ML IJ SOLN
4.0000 mg | Freq: Four times a day (QID) | INTRAMUSCULAR | Status: DC | PRN
  Administered 2017-03-20 – 2017-03-22 (×2): 4 mg via INTRAVENOUS
  Filled 2017-03-17 (×2): qty 2

## 2017-03-17 MED ORDER — ROPINIROLE HCL 0.5 MG PO TABS: 0.5000 mg | ORAL_TABLET | ORAL | Status: DC

## 2017-03-17 NOTE — Consult Note (Signed)
Primary Physician: Primary Cardiologist: Rich Brave [facility], Cardiology Fellow)  Seen 02/15/17  Asked to see by Evette Cristal for CP  HPI: Pt is a 66 yo who has most recently been seen at [facility] Pt with history of CKD (on dialysis), chronic diastolic CHF, CAD (Cath in Oct 2018 80% long prox/mid LAD  s/p single vessel CABG off pump  With LIMA to LADin Oct 2018 at [facility]), HTN, anemia.   Presents to ED today with L sided CP  ALso cough with bloddy sputum  Orthopnea. He says symptoms of chest discomfort started today   Pt admits to some dietary indescretion with salt   In ED given NTG and MSO4  BP on arrival 200/110    Note that he was seen in [place]  in Nov 2018  BP at time 145/64  Wt 176 lb       Past Medical History:  Diagnosis Date  . Anemia of chronic disease   . CAD (coronary artery disease)    a.  Myoview 4/11: EF 53%, no scar or ischemia   c. MV 2012 Nl perfusion, apical thinning.  No ischemia or scar.  EF 49%, appears greater by visual estimate.;  d.  Dob stress echo 12/13:  Negative Dob stress echo. There is no evidence of ischemia.  The LVF is normal. b. Normal cors 2016.  . Carotid stenosis    a. <97% RICA, >35% LICA by duplex 06/2990  . Chronic chest pain    occ  . ESRD (end stage renal disease) on dialysis ([place])   . GERD (gastroesophageal reflux disease)   . HTN (hypertension)    echo 3/10: EF 60%, LAE  . Hyperlipidemia   . Nephrolithiasis    "passed them all"  . Peripheral arterial disease ([place])    a. s/p PTCA to L SFA.  Marland Kitchen Pneumonia   . Restless legs   . Snores    a. presumed OSA, pt has refused sleep eval in past.  . Type II diabetes mellitus ([place])     Medications Prior to Admission  Medication Sig Dispense Refill  . aspirin EC 81 MG tablet Take 1 tablet (81 mg total) by mouth daily.    Marland Kitchen atorvastatin (LIPITOR) 40 MG tablet Take 1 tablet (40 mg total) by mouth daily at 6 PM. 30 tablet 5  . calcium acetate (PHOSLO) 667 MG capsule Take 3  capsules (2,001 mg total) by mouth 3 (three) times daily with meals. (Patient taking differently: Take 1,667-2,001 mg by mouth See admin instructions. 1334mg  by mouth twice daily, 2001mg  with snacks in between if needed for supplement) 270 capsule 0  . clopidogrel (PLAVIX) 75 MG tablet Take 1 tablet (75 mg total) by mouth daily with breakfast. 30 tablet 11  . cyclobenzaprine (FLEXERIL) 10 MG tablet Take 10 mg by mouth 3 (three) times daily as needed for muscle spasms.    Marland Kitchen gabapentin (NEURONTIN) 300 MG capsule Take 300 mg by mouth 2 (two) times daily.  2  . HYDROcodone-acetaminophen (NORCO) 5-325 MG tablet Take 1-2 tablets by mouth daily as needed. (Patient taking differently: Take 1 tablet by mouth every 12 (twelve) hours as needed for moderate pain. ) 20 tablet 0  . lisinopril (PRINIVIL,ZESTRIL) 20 MG tablet Take 20 mg by mouth at bedtime.    . metoprolol tartrate (LOPRESSOR) 25 MG tablet Take 12.5 mg by mouth 2 (two) times daily.    . multivitamin (RENA-VIT) TABS tablet Take 1 tablet by mouth  at bedtime. 30 tablet 0  . nitroGLYCERIN (NITROSTAT) 0.4 MG SL tablet Place 1 tablet (0.4 mg total) under the tongue every 5 (five) minutes as needed for chest pain. 25 tablet 3  . NOVOLOG 100 UNIT/ML injection 0.75 units per hour via insulin pump per pt - - USE AS DIRECTED (TRITRATE AS NEEDED) *MAX 50 UNITS A DAY*  5  . pantoprazole (PROTONIX) 40 MG tablet Take 40 mg by mouth daily.    Marland Kitchen rOPINIRole (REQUIP) 0.5 MG tablet Take 0.5 mg by mouth See admin instructions. Pt takes every night at bedtime and three days per week at dialysis    . tamsulosin (FLOMAX) 0.4 MG CAPS capsule Take 0.4 mg by mouth daily.    . traMADol (ULTRAM) 50 MG tablet Take 1 tablet (50 mg total) every 12 (twelve) hours as needed by mouth. (Patient taking differently: Take 50 mg by mouth every 6 (six) hours as needed for moderate pain. ) 30 tablet 0  . HYDROcodone-acetaminophen (NORCO) 7.5-325 MG tablet Take 1-2 tablets by mouth daily as  needed for moderate pain. (Patient not taking: Reported on 03/17/2017) 30 tablet 0  . HYDROcodone-acetaminophen (NORCO/VICODIN) 5-325 MG tablet Take 1 tablet by mouth every 6 (six) hours as needed for moderate pain. (Patient not taking: Reported on 03/17/2017) 20 tablet 0  . naproxen (NAPROSYN) 500 MG tablet Take 1 tablet (500 mg total) by mouth 2 (two) times daily. (Patient not taking: Reported on 03/17/2017) 30 tablet 0  . ondansetron (ZOFRAN) 4 MG tablet Take 1 tablet (4 mg total) by mouth every 6 (six) hours as needed for nausea or vomiting. (Patient not taking: Reported on 03/17/2017) 12 tablet 0  . oxyCODONE-acetaminophen (PERCOCET/ROXICET) 5-325 MG tablet Take 1 tablet by mouth every 6 (six) hours as needed for severe pain. (Patient not taking: Reported on 03/17/2017) 15 tablet 0     . aspirin EC  81 mg Oral Daily  . atorvastatin  40 mg Oral q1800  . calcium acetate  2,001 mg Oral TID WC  . [START ON 03/18/2017] clopidogrel  75 mg Oral Q breakfast  . gabapentin  300 mg Oral BID  . heparin  5,000 Units Subcutaneous Q8H  . insulin pump   Subcutaneous TID AC, HS, 0200  . lisinopril  20 mg Oral QHS  . metoprolol tartrate  12.5 mg Oral BID  . multivitamin  1 tablet Oral QHS  . pantoprazole  40 mg Oral QHS  . rOPINIRole  0.5 mg Oral BID    Infusions:   Allergies  Allergen Reactions  . Kiwi Extract Swelling    Facial swelling   . Tape Other (See Comments)    Plastic tape-blistering    Social History   Socioeconomic History  . Marital status: Married    Spouse name: Not on file  . Number of children: Not on file  . Years of education: Not on file  . Highest education level: Not on file  Social Needs  . Financial resource strain: Not on file  . Food insecurity - worry: Not on file  . Food insecurity - inability: Not on file  . Transportation needs - medical: Not on file  . Transportation needs - non-medical: Not on file  Occupational History  . Not on file  Tobacco Use  .  Smoking status: Former Smoker    Packs/day: 1.00    Years: 2.00    Pack years: 2.00    Types: Cigarettes  . Smokeless tobacco: Never Used  . Tobacco  comment: quit smoking 40 yrs ago  Substance and Sexual Activity  . Alcohol use: No    Alcohol/week: 0.0 oz  . Drug use: No  . Sexual activity: Yes    Birth control/protection: None  Other Topics Concern  . Not on file  Social History Narrative   The patient is a Retail buyer.  He is married and has 2 grown children.  Lives in [place] with his wife.  Denies tobacco, alcohol or IV drug abuse or marijuana or cocaine intake.     Family History  Problem Relation Age of Onset  . Heart attack Sister        died @ 2  . Cancer Mother        died @ 26; unknown type  . Diabetes Brother        deceased  . Cirrhosis Father        alcohol related  . Diabetes Father   . Esophageal cancer Neg Hx   . Colon cancer Neg Hx   . Pancreatic cancer Neg Hx   . Stomach cancer Neg Hx     REVIEW OF SYSTEMS:  All systems reviewed  Negative to the above problem except as noted above.    PHYSICAL EXAM: Vitals:   03/17/17 1515 03/17/17 1634  BP: (!) 190/98 (!) 201/96  Pulse:  82  Resp: 18   Temp:  97.8 F (36.6 C)  SpO2:  96%    No intake or output data in the 24 hours ending 03/17/17 1642  General:  Thin 66 yo in NAD   HEENT: normal Neck: supple. no JVD. Carotids 2+ bilat; no bruits. No lymphadenopathy or thryomegaly appreciated. Cor: PMI nondisplaced. Regular rate & rhythm. No rubs, gallops  Continous murmur heard L chest   Lungs: Mild rhonchi Abdomen: soft, nontender, nondistended. No hepatosplenomegaly. No bruits or masses. Good bowel sounds. Extremities: no cyanosis, clubbing, rash, edema Neuro: alert & oriented x 3, cranial nerves grossly intact. moves all 4 extremities w/o difficulty. Affect pleasant.  ECG:  Sinus rhythm 88 bpm  Septal MI  Nonspecific S changes   Results for orders placed or performed during the hospital encounter of  03/17/17 (from the past 24 hour(s))  I-stat troponin, ED     Status: None   Collection Time: 03/17/17  9:42 AM  Result Value Ref Range   Troponin i, poc 0.04 0.00 - 0.08 ng/mL   Comment 3          Basic metabolic panel     Status: Abnormal   Collection Time: 03/17/17  9:44 AM  Result Value Ref Range   Sodium 134 (L) 135 - 145 mmol/L   Potassium 3.2 (L) 3.5 - 5.1 mmol/L   Chloride 94 (L) 101 - 111 mmol/L   CO2 29 22 - 32 mmol/L   Glucose, Bld 106 (H) 65 - 99 mg/dL   BUN 12 6 - 20 mg/dL   Creatinine, Ser 6.58 (H) 0.61 - 1.24 mg/dL   Calcium 10.5 (H) 8.9 - 10.3 mg/dL   GFR calc non Af Amer 8 (L) >60 mL/min   GFR calc Af Amer 9 (L) >60 mL/min   Anion gap 11 5 - 15  CBC     Status: Abnormal   Collection Time: 03/17/17  9:44 AM  Result Value Ref Range   WBC 9.1 4.0 - 10.5 K/uL   RBC 4.28 4.22 - 5.81 MIL/uL   Hemoglobin 12.6 (L) 13.0 - 17.0 g/dL   HCT 39.8 39.0 - 52.0 %  MCV 93.0 78.0 - 100.0 fL   MCH 29.4 26.0 - 34.0 pg   MCHC 31.7 30.0 - 36.0 g/dL   RDW 16.3 (H) 11.5 - 15.5 %   Platelets 208 150 - 400 K/uL  Brain natriuretic peptide     Status: Abnormal   Collection Time: 03/17/17 11:24 AM  Result Value Ref Range   B Natriuretic Peptide 3,394.7 (H) 0.0 - 100.0 pg/mL  I-stat chem 8, ed     Status: Abnormal   Collection Time: 03/17/17 11:33 AM  Result Value Ref Range   Sodium 138 135 - 145 mmol/L   Potassium 3.1 (L) 3.5 - 5.1 mmol/L   Chloride 95 (L) 101 - 111 mmol/L   BUN 13 6 - 20 mg/dL   Creatinine, Ser 6.30 (H) 0.61 - 1.24 mg/dL   Glucose, Bld 87 65 - 99 mg/dL   Calcium, Ion 1.26 1.15 - 1.40 mmol/L   TCO2 32 22 - 32 mmol/L   Hemoglobin 15.0 13.0 - 17.0 g/dL   HCT 44.0 39.0 - 52.0 %  Hemoglobin A1c     Status: None   Collection Time: 03/17/17  3:23 PM  Result Value Ref Range   Hgb A1c MFr Bld 4.8 4.8 - 5.6 %   Mean Plasma Glucose 91.06 mg/dL   Dg Chest 2 View  Result Date: 03/17/2017 CLINICAL DATA:  Nausea vomiting earlier today. EXAM: CHEST  2 VIEW COMPARISON:   11/29/2016 FINDINGS: Cardiac silhouette is normal in size. No mediastinal or hilar masses. No evidence of adenopathy. Mild interstitial thickening is noted most evident in the lower lungs, including thickening of the fissures. No lung consolidation to suggest pneumonia. No pleural effusion. No pneumothorax. Skeletal structures are intact. IMPRESSION: 1. Mild interstitial thickening, which appears mildly increased from prior exams. If there shortness of breath, consider mild interstitial pulmonary edema. 2. No convincing pneumonia. Electronically Signed   By: Lajean Manes M.D.   On: 03/17/2017 10:00     ASSESSMENT:  1  Chest pain  Pt with pain pressure   EKG neg  Trop with minimal elevation  BNP elelvated . I am not convinced of active angina.  May reflect fluid increase in setting of severe HTN (diastolic dysfunction) REnal also following  Recomm dialysis  Will follow clinicially post dialysis  2  HTN  BP is severely elevated  Follow after dialyssis.  3  HL  Continue statin  4  ESRD   Renal also consulted with dialyssi planned

## 2017-03-17 NOTE — Significant Event (Signed)
The nurse notified that patient has not brought his insulin pump.  On contacting patient's wife patient's wife stated that patient has not been using his insulin pump from recent surgery since October.  At this time plan will be to check his CBGs every 4 hours with sliding scale coverage and based on this we will have further plans on placing patient on long-acting insulin.  Gean Birchwood

## 2017-03-17 NOTE — ED Triage Notes (Addendum)
Pt states new onset centralized/left sided chest pain that started in the middle of the night. Pt had open heart surgery in October. Pt rates pain 6/10. Per wife pt spit up blood earlier this morning. Pt has blood tinged mucous at triage. Pt also endorses some shortness of breath.  Dialysis [facility], has not missed any

## 2017-03-17 NOTE — H&P (Signed)
History and Physical    Jon Anderson PPI:[id number] DOB: May 24, 1950 DOA: 03/17/2017   PCP: [name], MD   Attending physician: Evangeline Gula  Patient coming from/Resides with: Private residence/family  Chief Complaint: Chest pain with shortness of breath  HPI: Jon Anderson is a 66 y.o. male with medical history significant for chronic kidney disease on dialysis, history of chronic diastolic heart failure with mild concentric LVH on echo, recent history of single-vessel CABG October 2018 at [facility], hypertension, anemia chronic disease, dyslipidemia and diabetes.  Patient presents to the ER complaining of central to left-sided chest pain that occurred in the moment night.  He rated the pain at 6/10.  Patient also had one episode of coughing with blood-tinged sputum.  Patient told me he was having orthopnea as well.  Both patient and family conceded that patient has been noncompliant with dietary regimen and has had more salt intake and appropriate for his underlying renal and cardiac conditions.  In the ER patient was given 1 nitroglycerin without relief of his pain but had decreased pain after administration of morphine.  Chest x-ray demonstrates mild edematous changes but no evidence of pneumonia or pleural effusions.  Patient's blood pressure has been markedly abnormal ranging from 198/107 and 200/110.  His EKG demonstrated sinus rhythm without any acute ischemic changes.  He remains tachypneic especially when supine.  Nephrology has seen the patient and plans on dialysis today for volume overload.  Formal cardiology consultation is pending regarding reports of chest pain and history of recent CABG procedure.  ED Course:  Vital Signs: BP (!) 195/93   Pulse 95   Temp (!) 97.4 F (36.3 C)   Resp 19   Ht 5\' 11"  (1.803 m)   Wt 76.7 kg (169 lb)   SpO2 96%   BMI 23.57 kg/m  2 view chest x-ray: Mild interstitial pulmonary edema Lab data: Sodium 134, potassium 3.2, chloride 94, CO2  29, glucose 106, BUN 12, creatinine 6.58, calcium 10.5, anion gap 11, BNP 3400, poc troponin 0 0.04, white count 9100 differential not obtained, hemoglobin 12.6, platelets 208,000 Medications and treatments: Lisinopril 20 mg x1, Lopressor 25 mg x1, aspirin 324 mg x1 and morphine 4 mg IV x1  Review of Systems:  In addition to the HPI above,  No Fever-chills, myalgias or other constitutional symptoms No Headache, changes with Vision or hearing, new weakness, tingling, numbness in any extremity, dizziness, dysarthria or word finding difficulty, gait disturbance or imbalance, tremors or seizure activity No problems swallowing food or Liquids, indigestion/reflux, choking or coughing while eating, abdominal pain with or after eating No palpitations No Abdominal pain, N/V, melena,hematochezia, dark tarry stools, constipation No dysuria, malodorous urine, hematuria or flank pain No new skin rashes, lesions, masses or bruises, No new joint pains, aches, swelling or redness No recent unintentional weight gain or loss No polyuria, polydypsia or polyphagia   Past Medical History:  Diagnosis Date  . Anemia of chronic disease   . CAD (coronary artery disease)    a.  Myoview 4/11: EF 53%, no scar or ischemia   c. MV 2012 Nl perfusion, apical thinning.  No ischemia or scar.  EF 49%, appears greater by visual estimate.;  d.  Dob stress echo 12/13:  Negative Dob stress echo. There is no evidence of ischemia.  The LVF is normal. b. Normal cors 2016.  . Carotid stenosis    a. <06% RICA, >30% LICA by duplex 04/6008  . Chronic chest pain    occ  .  ESRD (end stage renal disease) on dialysis ([place])   . GERD (gastroesophageal reflux disease)   . HTN (hypertension)    echo 3/10: EF 60%, LAE  . Hyperlipidemia   . Nephrolithiasis    "passed them all"  . Peripheral arterial disease ([place])    a. s/p PTCA to L SFA.  Marland Kitchen Pneumonia   . Restless legs   . Snores    a. presumed OSA, pt has refused sleep eval in past.    . Type II diabetes mellitus ([place])     Past Surgical History:  Procedure Laterality Date  . ANGIOPLASTY / STENTING FEMORAL Left 12/11/2013   dr berry  . AV FISTULA PLACEMENT Left 03/19/2014   Procedure: CREATION OF ARTERIOVENOUS (AV) FISTULA  LEFT UPPER ARM;  Surgeon: Mal Misty, MD;  Location: [facility];  Service: Vascular;  Laterality: Left;  . CARDIAC CATHETERIZATION  2001 and 2010   . COLONOSCOPY W/ BIOPSIES AND POLYPECTOMY    . COLONOSCOPY WITH PROPOFOL N/A 08/01/2016   Procedure: COLONOSCOPY WITH PROPOFOL;  Surgeon: Carol Ada, MD;  Location: [facility];  Service: Endoscopy;  Laterality: N/A;  . ESOPHAGOGASTRODUODENOSCOPY (EGD) WITH PROPOFOL N/A 08/01/2016   Procedure: ESOPHAGOGASTRODUODENOSCOPY (EGD) WITH PROPOFOL;  Surgeon: Carol Ada, MD;  Location: [facility];  Service: Endoscopy;  Laterality: N/A;  . FOOT FRACTURE SURGERY Right   . FRACTURE SURGERY    . INGUINAL HERNIA REPAIR Left   . LEFT HEART CATHETERIZATION WITH CORONARY ANGIOGRAM N/A 06/22/2014   Procedure: LEFT HEART CATHETERIZATION WITH CORONARY ANGIOGRAM;  Surgeon: Troy Sine, MD;  Location: [facility] C[facility] LAB;  Service: Cardiovascular;  Laterality: N/A;  . LOWER EXTREMITY ANGIOGRAM Left 12/11/2013   Procedure: LOWER EXTREMITY ANGIOGRAM;  Surgeon: Lorretta Harp, MD;  Location: [facility] CATH LAB;  Service: Cardiovascular;  Laterality: Left;  . TONSILLECTOMY AND ADENOIDECTOMY      Social History   Socioeconomic History  . Marital status: Married    Spouse name: Not on file  . Number of children: Not on file  . Years of education: Not on file  . Highest education level: Not on file  Social Needs  . Financial resource strain: Not on file  . Food insecurity - worry: Not on file  . Food insecurity - inability: Not on file  . Transportation needs - medical: Not on file  . Transportation needs - non-medical: Not on file  Occupational History  . Not on file  Tobacco Use  . Smoking status: Former Smoker    Packs/day:  1.00    Years: 2.00    Pack years: 2.00    Types: Cigarettes  . Smokeless tobacco: Never Used  . Tobacco comment: quit smoking 40 yrs ago  Substance and Sexual Activity  . Alcohol use: No    Alcohol/week: 0.0 oz  . Drug use: No  . Sexual activity: Yes    Birth control/protection: None  Other Topics Concern  . Not on file  Social History Narrative   The patient is a Retail buyer.  He is married and has 2 grown children.  Lives in [place] with his wife.  Denies tobacco, alcohol or IV drug abuse or marijuana or cocaine intake.     Mobility: Utilizes a cane Work history: Not obtained   Allergies  Allergen Reactions  . Kiwi Extract Swelling    Facial swelling   . Tape Other (See Comments)    Plastic tape-blistering    Family History  Problem Relation Age of Onset  . Heart attack  Sister        died @ 4  . Cancer Mother        died @ 60; unknown type  . Diabetes Brother        deceased  . Cirrhosis Father        alcohol related  . Diabetes Father   . Esophageal cancer Neg Hx   . Colon cancer Neg Hx   . Pancreatic cancer Neg Hx   . Stomach cancer Neg Hx      Prior to Admission medications   Medication Sig Start Date End Date Taking? Authorizing Provider  aspirin EC 81 MG tablet Take 1 tablet (81 mg total) by mouth daily. 05/28/12  Yes Weaver, Scott T, PA-C  atorvastatin (LIPITOR) 40 MG tablet Take 1 tablet (40 mg total) by mouth daily at 6 PM. 12/13/13  Yes Brett Canales, PA-C  calcium acetate (PHOSLO) 667 MG capsule Take 3 capsules (2,001 mg total) by mouth 3 (three) times daily with meals. Patient taking differently: Take 1,667-2,001 mg by mouth See admin instructions. 1334mg  by mouth twice daily, 2001mg  with snacks in between if needed for supplement 10/09/15  Yes Short, Noah Delaine, MD  clopidogrel (PLAVIX) 75 MG tablet Take 1 tablet (75 mg total) by mouth daily with breakfast. 12/13/13  Yes Brett Canales, PA-C  cyclobenzaprine (FLEXERIL) 10 MG tablet Take 10 mg by mouth 3 (three)  times daily as needed for muscle spasms.   Yes [provider]  gabapentin (NEURONTIN) 300 MG capsule Take 300 mg by mouth 2 (two) times daily. 09/25/16  Yes [provider]  HYDROcodone-acetaminophen (NORCO) 5-325 MG tablet Take 1-2 tablets by mouth daily as needed. Patient taking differently: Take 1 tablet by mouth every 12 (twelve) hours as needed for moderate pain.  01/16/17  Yes Leandrew Koyanagi, MD  lisinopril (PRINIVIL,ZESTRIL) 20 MG tablet Take 20 mg by mouth at bedtime.   Yes [provider]  metoprolol tartrate (LOPRESSOR) 25 MG tablet Take 12.5 mg by mouth 2 (two) times daily.   Yes [provider]  multivitamin (RENA-VIT) TABS tablet Take 1 tablet by mouth at bedtime. 06/23/14  Yes Domenic Polite, MD  nitroGLYCERIN (NITROSTAT) 0.4 MG SL tablet Place 1 tablet (0.4 mg total) under the tongue every 5 (five) minutes as needed for chest pain. 03/20/12  Yes Barrett, Evelene Croon, PA-C  NOVOLOG 100 UNIT/ML injection 0.75 units per hour via insulin pump per pt - - USE AS DIRECTED (TRITRATE AS NEEDED) *MAX 50 UNITS A DAY* 08/28/14  Yes [provider]  pantoprazole (PROTONIX) 40 MG tablet Take 40 mg by mouth daily.   Yes [provider]  rOPINIRole (REQUIP) 0.5 MG tablet Take 0.5 mg by mouth See admin instructions. Pt takes every night at bedtime and three days per week at dialysis   Yes [provider]  tamsulosin (FLOMAX) 0.4 MG CAPS capsule Take 0.4 mg by mouth daily.   Yes [provider]  traMADol (ULTRAM) 50 MG tablet Take 1 tablet (50 mg total) every 12 (twelve) hours as needed by mouth. Patient taking differently: Take 50 mg by mouth every 6 (six) hours as needed for moderate pain.  02/23/17  Yes Leandrew Koyanagi, MD  HYDROcodone-acetaminophen (NORCO) 7.5-325 MG tablet Take 1-2 tablets by mouth daily as needed for moderate pain. Patient not taking: Reported on 03/17/2017 01/23/17   Leandrew Koyanagi, MD  HYDROcodone-acetaminophen  (NORCO/VICODIN) 5-325 MG tablet Take 1 tablet by mouth every 6 (six) hours as  needed for moderate pain. Patient not taking: Reported on 03/17/2017 11/29/16   Milton Ferguson, MD  naproxen (NAPROSYN) 500 MG tablet Take 1 tablet (500 mg total) by mouth 2 (two) times daily. Patient not taking: Reported on 03/17/2017 12/26/16   Maczis, Barth Kirks, PA-C  ondansetron (ZOFRAN) 4 MG tablet Take 1 tablet (4 mg total) by mouth every 6 (six) hours as needed for nausea or vomiting. Patient not taking: Reported on 03/17/2017 06/14/16   Forde Dandy, MD  oxyCODONE-acetaminophen (PERCOCET/ROXICET) 5-325 MG tablet Take 1 tablet by mouth every 6 (six) hours as needed for severe pain. Patient not taking: Reported on 03/17/2017 01/02/17   Recardo Evangelist, PA-C    Physical Exam: Vitals:   03/17/17 0939 03/17/17 0941 03/17/17 1045 03/17/17 1445  BP:  (!) 198/107 (!) 200/110 (!) 195/93  Pulse:  95    Resp:  17  19  Temp:  (!) 97.4 F (36.3 C)    SpO2:  96%    Weight: 76.7 kg (169 lb)     Height: 5\' 11"  (1.803 m)         Constitutional: NAD, calm, comfortable-examined while lying supine on stretcher Eyes: PERRL, lids and conjunctivae normal ENMT: Mucous membranes are moist. Posterior pharynx clear of any exudate or lesions.Normal dentition.  Neck: normal, supple, no masses, no thyromegaly Respiratory: clear to auscultation bilaterally, no wheezing; a few bibasilar fine crackles. Normal respiratory effort. No accessory muscle use. RA Cardiovascular: Regular rate and rhythm, no murmurs / rubs / gallops. No extremity edema. 2+ pedal pulses. No carotid bruits.  Examined lying supine so unable to accurately assess whether it has true JVD Abdomen: no tenderness, no masses palpated. No hepatosplenomegaly. Bowel sounds positive.  Musculoskeletal: no clubbing / cyanosis. No joint deformity upper and lower extremities. Good ROM, no contractures. Normal muscle tone.  Skin: no rashes, lesions, ulcers. No  induration Neurologic: CN 2-12 grossly intact. Sensation intact, DTR normal. Strength 5/5 x all 4 extremities.  Psychiatric: Normal judgment and insight. Alert and oriented x 3. Normal mood.    Labs on Admission: I have personally reviewed following labs and imaging studies  CBC: Recent Labs  Lab 03/17/17 0944 03/17/17 1133  WBC 9.1  --   HGB 12.6* 15.0  HCT 39.8 44.0  MCV 93.0  --   PLT 208  --    Basic Metabolic Panel: Recent Labs  Lab 03/17/17 0944 03/17/17 1133  NA 134* 138  K 3.2* 3.1*  CL 94* 95*  CO2 29  --   GLUCOSE 106* 87  BUN 12 13  CREATININE 6.58* 6.30*  CALCIUM 10.5*  --    GFR: Estimated Creatinine Clearance: 12.1 mL/min (A) (by C-G formula based on SCr of 6.3 mg/dL (H)). Liver Function Tests: No results for input(s): AST, ALT, ALKPHOS, BILITOT, PROT, ALBUMIN in the last 168 hours. No results for input(s): LIPASE, AMYLASE in the last 168 hours. No results for input(s): AMMONIA in the last 168 hours. Coagulation Profile: No results for input(s): INR, PROTIME in the last 168 hours. Cardiac Enzymes: No results for input(s): CKTOTAL, CKMB, CKMBINDEX, TROPONINI in the last 168 hours. BNP (last 3 results) No results for input(s): PROBNP in the last 8760 hours. HbA1C: No results for input(s): HGBA1C in the last 72 hours. CBG: No results for input(s): GLUCAP in the last 168 hours. Lipid Profile: No results for input(s): CHOL, HDL, LDLCALC, TRIG, CHOLHDL, LDLDIRECT in the last 72 hours. Thyroid Function Tests: No results for input(s): TSH, T4TOTAL,  FREET4, T3FREE, THYROIDAB in the last 72 hours. Anemia Panel: No results for input(s): VITAMINB12, FOLATE, FERRITIN, TIBC, IRON, RETICCTPCT in the last 72 hours. Urine analysis:    Component Value Date/Time   COLORURINE YELLOW 06/18/2014 2308   APPEARANCEUR CLEAR 06/18/2014 2308   LABSPEC 1.012 06/18/2014 2308   PHURINE 7.0 06/18/2014 2308   GLUCOSEU 250 (A) 06/18/2014 2308   HGBUR MODERATE (A) 06/18/2014  2308   BILIRUBINUR NEGATIVE 06/18/2014 2308   KETONESUR NEGATIVE 06/18/2014 2308   PROTEINUR >300 (A) 06/18/2014 2308   UROBILINOGEN 0.2 06/18/2014 2308   NITRITE NEGATIVE 06/18/2014 2308   LEUKOCYTESUR NEGATIVE 06/18/2014 2308   Sepsis Labs: @LABRCNTIP (procalcitonin:4,lacticidven:4) )No results found for this or any previous visit (from the past 240 hour(s)).   Radiological Exams on Admission: Dg Chest 2 View  Result Date: 03/17/2017 CLINICAL DATA:  Nausea vomiting earlier today. EXAM: CHEST  2 VIEW COMPARISON:  11/29/2016 FINDINGS: Cardiac silhouette is normal in size. No mediastinal or hilar masses. No evidence of adenopathy. Mild interstitial thickening is noted most evident in the lower lungs, including thickening of the fissures. No lung consolidation to suggest pneumonia. No pleural effusion. No pneumothorax. Skeletal structures are intact. IMPRESSION: 1. Mild interstitial thickening, which appears mildly increased from prior exams. If there shortness of breath, consider mild interstitial pulmonary edema. 2. No convincing pneumonia. Electronically Signed   By: Lajean Manes M.D.   On: 03/17/2017 10:00    EKG: (Independently reviewed) sinus rhythm with ventricular rate 88 bpm, QTC 450 ms, delayed R wave rotation but otherwise unremarkable EKG  Assessment/Plan Principal Problem:   Chest pain/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/[facility] ([facility]A to [facility]) -Presents with central nonradiating chest pain with no other associated symptoms and known history of recent single-vessel CABG procedure -Initial EKG and troponin reassuring -Suspect chest pain related to uncontrolled hypertension in the context of volume overload -For completeness of evaluation cycle troponin -Formal cardiology consultation pending -Continue beta-blocker, statin and aspirin  Active Problems:   Severe uncontrolled hypertension -Presents with elevated blood pressure in the context of dietary noncompliance with excess sodium  intake -Continue preadmission beta-blocker and ACE inhibitor -Anticipate urgent hemodialysis and correction of volume overload will help improve blood pressure dramatically    Chronic chest pain -See above    Acute on chronic diastolic heart failure -Presents with chest pain and elevated blood pressure with mild pulmonary edema on chest x-ray in the context of excess sodium intake in diet -Continue beta-blocker and ACE inhibitor as above -Daily weight, strict I/O -Echocardiogram at [facility] April 2018: EF 55-60%, mild pulmonary hypertension, mild tricuspid regurgitation, mild concentric left ventricular hypertrophy -Given recent revascularization procedure and reports of chest pain at presentation with heart failure will repeat echocardiogram this admission    ESRD (end stage renal disease) on dialysis  -Typical dialysis days MWF -he denies missing dialysis but did admit to dietary indiscretion with excess sodium intake -Nephrology consulted and plans urgent hemodialysis today -Management of metabolic bone disease per nephrology    Type II diabetes mellitus -On insulin pump-utilize insulin pump order set -HgbA1c    Anemia of chronic disease -Hemoglobin stable and at baseline    Hyperlipidemia -Continue statin      DVT prophylaxis: Subcu heparin Code Status: Full Family Communication: Family at bedside Disposition Plan: Home Consults called: Nephrology/[place]; Cardiology/[facility] on call    ELLIS,ALLISON L. ANP-BC [facility] Pager [phone]   If 7PM-7AM, please contact night-coverage [email or web address] Password [email or web address]  03/17/2017, 3:11 PM

## 2017-03-17 NOTE — Consult Note (Signed)
[facility] Renal Consultation Note    Indication for Consultation:  Management of ESRD/hemodialysis; anemia, hypertension/volume and secondary hyperparathyroidism  HPI: Jon Anderson is a 66 y.o. male with ESRD secondary to HTN, started HD 06/2014. Dialyzes MWF at [facility] PMH includes, HTN, PVD, Type 2 DM, non-obstructive CAD on LHC (06/2014), Hx nephrolithiasis, COPD, GERD, L carotid stenosis (2014). S/p CABG 01/30/17 at [facility]  Presents to ED with chest pain and SOB that started last night about 1am, woke him from sleep. Also having cough yesterday with bloody sputum. Given morphine in ED and sedated now, says chest pain has improved, still SOB on nasal cannula. Hypertensive in ED with BP 198/107. No acute changes on EKG, CXR showing mild interstitial edema. Labs with K 3.2, Ca 10.5, Troponin 0.04, WBC 9.1 HG. 12.6.  He will be admitted for further cardiac evaluation.   Last HD 12/7. Post HD weight 75.1kg Has been compliant with HD, no issues with treatment.    Past Medical History:  Diagnosis Date  . Anemia of chronic disease   . CAD (coronary artery disease)    a.  Myoview 4/11: EF 53%, no scar or ischemia   c. MV 2012 Nl perfusion, apical thinning.  No ischemia or scar.  EF 49%, appears greater by visual estimate.;  d.  Dob stress echo 12/13:  Negative Dob stress echo. There is no evidence of ischemia.  The LVF is normal. b. Normal cors 2016.  . Carotid stenosis    a. <33% RICA, >82% LICA by duplex 08/537  . Chronic chest pain    occ  . ESRD (end stage renal disease) on dialysis ([place])   . GERD (gastroesophageal reflux disease)   . HTN (hypertension)    echo 3/10: EF 60%, LAE  . Hyperlipidemia   . Nephrolithiasis    "passed them all"  . Peripheral arterial disease ([place])    a. s/p PTCA to L SFA.  Marland Kitchen Pneumonia   . Restless legs   . Snores    a. presumed OSA, pt has refused sleep eval in past.  . Type II diabetes mellitus ([place])    Past Surgical  History:  Procedure Laterality Date  . ANGIOPLASTY / STENTING FEMORAL Left 12/11/2013   dr berry  . AV FISTULA PLACEMENT Left 03/19/2014   Procedure: CREATION OF ARTERIOVENOUS (AV) FISTULA  LEFT UPPER ARM;  Surgeon: Mal Misty, MD;  Location: [facility];  Service: Vascular;  Laterality: Left;  . CARDIAC CATHETERIZATION  2001 and 2010   . COLONOSCOPY W/ BIOPSIES AND POLYPECTOMY    . COLONOSCOPY WITH PROPOFOL N/A 08/01/2016   Procedure: COLONOSCOPY WITH PROPOFOL;  Surgeon: Carol Ada, MD;  Location: [facility];  Service: Endoscopy;  Laterality: N/A;  . ESOPHAGOGASTRODUODENOSCOPY (EGD) WITH PROPOFOL N/A 08/01/2016   Procedure: ESOPHAGOGASTRODUODENOSCOPY (EGD) WITH PROPOFOL;  Surgeon: Carol Ada, MD;  Location: [facility];  Service: Endoscopy;  Laterality: N/A;  . FOOT FRACTURE SURGERY Right   . FRACTURE SURGERY    . INGUINAL HERNIA REPAIR Left   . LEFT HEART CATHETERIZATION WITH CORONARY ANGIOGRAM N/A 06/22/2014   Procedure: LEFT HEART CATHETERIZATION WITH CORONARY ANGIOGRAM;  Surgeon: Troy Sine, MD;  Location[facility] C[facility] LAB;  Service: Cardiovascular;  Laterality: N/A;  . LOWER EXTREMITY ANGIOGRAM Left 12/11/2013   Procedure: LOWER EXTREMITY ANGIOGRAM;  Surgeon: Lorretta Harp, MD;  Location: [facility] CATH LAB;  Service: Cardiovascular;  Laterality: Left;  . TONSILLECTOMY AND ADENOIDECTOMY     Family History  Problem Relation  Age of Onset  . Heart attack Sister        died @ 36  . Cancer Mother        died @ 74; unknown type  . Diabetes Brother        deceased  . Cirrhosis Father        alcohol related  . Diabetes Father   . Esophageal cancer Neg Hx   . Colon cancer Neg Hx   . Pancreatic cancer Neg Hx   . Stomach cancer Neg Hx    Social History:  reports that he has quit smoking. His smoking use included cigarettes. He has a 2.00 pack-year smoking history. he has never used smokeless tobacco. He reports that he does not drink alcohol or use drugs. Allergies  Allergen Reactions   . Kiwi Extract Swelling    Facial swelling   . Tape Other (See Comments)    Plastic tape-blistering   Prior to Admission medications   Medication Sig Start Date End Date Taking? Authorizing Provider  aspirin EC 81 MG tablet Take 1 tablet (81 mg total) by mouth daily. 05/28/12   Richardson Dopp T, PA-C  atorvastatin (LIPITOR) 40 MG tablet Take 1 tablet (40 mg total) by mouth daily at 6 PM. 12/13/13   Brett Canales, PA-C  calcium acetate (PHOSLO) 667 MG capsule Take 3 capsules (2,001 mg total) by mouth 3 (three) times daily with meals. Patient taking differently: Take 2,001 mg by mouth 3 (three) times daily with meals. May take 667 -1334 mg with snacks 10/09/15   Janece Canterbury, MD  clonazePAM [facility] [place]) 0.5 MG tablet 0.5-1mg  by mouth at bedtime 09/22/14   [provider]  clopidogrel (PLAVIX) 75 MG tablet Take 1 tablet (75 mg total) by mouth daily with breakfast. 12/13/13   Brett Canales, PA-C  gabapentin (NEURONTIN) 300 MG capsule Take 300 mg by mouth 2 (two) times daily. 09/25/16   [provider]  HYDROcodone-acetaminophen (NORCO) 5-325 MG tablet Take 1-2 tablets by mouth daily as needed. 01/16/17   Leandrew Koyanagi, MD  HYDROcodone-acetaminophen (NORCO) 7.5-325 MG tablet Take 1-2 tablets by mouth daily as needed for moderate pain. 01/23/17   Leandrew Koyanagi, MD  HYDROcodone-acetaminophen (NORCO/VICODIN) 5-325 MG tablet Take 1 tablet by mouth every 6 (six) hours as needed for moderate pain. 11/29/16   Milton Ferguson, MD  lisinopril (PRINIVIL,ZESTRIL) 20 MG tablet Take 20 mg by mouth at bedtime.    [provider]  multivitamin (RENA-VIT) TABS tablet Take 1 tablet by mouth at bedtime. 06/23/14   Domenic Polite, MD  naproxen (NAPROSYN) 500 MG tablet Take 1 tablet (500 mg total) by mouth 2 (two) times daily. 12/26/16   Maczis, Barth Kirks, PA-C  nitroGLYCERIN (NITROSTAT) 0.4 MG SL tablet Place 1 tablet (0.4 mg total) under the tongue every 5 (five) minutes as needed for chest pain.  03/20/12   Barrett, Evelene Croon, PA-C  NOVOLOG 100 UNIT/ML injection 0.75 units per hour via insulin pump per pt - - USE AS DIRECTED (TRITRATE AS NEEDED) *MAX 50 UNITS A DAY* 08/28/14   [provider]  ondansetron (ZOFRAN) 4 MG tablet Take 1 tablet (4 mg total) by mouth every 6 (six) hours as needed for nausea or vomiting. 06/14/16   Forde Dandy, MD  oxyCODONE-acetaminophen (PERCOCET/ROXICET) 5-325 MG tablet Take 1 tablet by mouth every 6 (six) hours as needed for severe pain. 01/02/17   Recardo Evangelist, PA-C  pantoprazole (PROTONIX) 40 MG tablet Take 40 mg by  mouth daily.    [provider]  promethazine (PHENERGAN) 25 MG tablet Take 25 mg by mouth every 6 (six) hours as needed for nausea or vomiting.    [provider]  rOPINIRole (REQUIP) 0.5 MG tablet Take 0.5 mg by mouth 2 (two) times daily.     [provider]  tamsulosin (FLOMAX) 0.4 MG CAPS capsule Take 0.4 mg by mouth daily.    [provider]  traMADol (ULTRAM) 50 MG tablet Take 1 tablet (50 mg total) every 12 (twelve) hours as needed by mouth. 02/23/17   Leandrew Koyanagi, MD   Current Facility-Administered Medications  Medication Dose Route Frequency Provider Last Rate Last Dose  . acetaminophen (TYLENOL) tablet 650 mg  650 mg Oral Q4H PRN Samella Parr, NP      . aspirin EC tablet 81 mg  81 mg Oral Daily Samella Parr, NP      . atorvastatin (LIPITOR) tablet 40 mg  40 mg Oral q1800 Samella Parr, NP      . calcium acetate (PHOSLO) capsule 2,001 mg  2,001 mg Oral TID WC Samella Parr, NP      . clonazePAM (KLONOPIN) tablet 1 mg  1 mg Oral QHS PRN Samella Parr, NP      . Derrill Memo ON 03/18/2017] clopidogrel (PLAVIX) tablet 75 mg  75 mg Oral Q breakfast Samella Parr, NP      . gabapentin (NEURONTIN) capsule 300 mg  300 mg Oral BID Samella Parr, NP      . heparin injection 5,000 Units  5,000 Units Subcutaneous Q8H Samella Parr, NP      . HYDROcodone-acetaminophen  (NORCO/VICODIN) 5-325 MG per tablet 1-2 tablet  1-2 tablet Oral Daily PRN Samella Parr, NP      . insulin aspart (novoLOG) injection 0-9 Units  0-9 Units Subcutaneous Q4H Samella Parr, NP      . lisinopril (PRINIVIL,ZESTRIL) tablet 20 mg  20 mg Oral QHS Samella Parr, NP      . nitroGLYCERIN (NITROSTAT) SL tablet 0.4 mg  0.4 mg Sublingual Q5 min PRN Maczis, Barth Kirks, PA-C      . ondansetron [facility]) injection 4 mg  4 mg Intravenous Q6H PRN Samella Parr, NP      . pantoprazole (PROTONIX) EC tablet 40 mg  40 mg Oral Daily Samella Parr, NP      . rOPINIRole (REQUIP) tablet 0.5 mg  0.5 mg Oral BID Samella Parr, NP       Current Outpatient Medications  Medication Sig Dispense Refill  . aspirin EC 81 MG tablet Take 1 tablet (81 mg total) by mouth daily.    Marland Kitchen atorvastatin (LIPITOR) 40 MG tablet Take 1 tablet (40 mg total) by mouth daily at 6 PM. 30 tablet 5  . calcium acetate (PHOSLO) 667 MG capsule Take 3 capsules (2,001 mg total) by mouth 3 (three) times daily with meals. (Patient taking differently: Take 2,001 mg by mouth 3 (three) times daily with meals. May take 667 -1334 mg with snacks) 270 capsule 0  . clonazePAM (KLONOPIN) 0.5 MG tablet 0.5-1mg  by mouth at bedtime  0  . clopidogrel (PLAVIX) 75 MG tablet Take 1 tablet (75 mg total) by mouth daily with breakfast. 30 tablet 11  . gabapentin (NEURONTIN) 300 MG capsule Take 300 mg by mouth 2 (two) times daily.  2  . HYDROcodone-acetaminophen (NORCO) 5-325 MG tablet Take 1-2 tablets by mouth daily as needed.  20 tablet 0  . HYDROcodone-acetaminophen (NORCO) 7.5-325 MG tablet Take 1-2 tablets by mouth daily as needed for moderate pain. 30 tablet 0  . HYDROcodone-acetaminophen (NORCO/VICODIN) 5-325 MG tablet Take 1 tablet by mouth every 6 (six) hours as needed for moderate pain. 20 tablet 0  . lisinopril (PRINIVIL,ZESTRIL) 20 MG tablet Take 20 mg by mouth at bedtime.    . multivitamin (RENA-VIT) TABS tablet Take 1 tablet by mouth  at bedtime. 30 tablet 0  . naproxen (NAPROSYN) 500 MG tablet Take 1 tablet (500 mg total) by mouth 2 (two) times daily. 30 tablet 0  . nitroGLYCERIN (NITROSTAT) 0.4 MG SL tablet Place 1 tablet (0.4 mg total) under the tongue every 5 (five) minutes as needed for chest pain. 25 tablet 3  . NOVOLOG 100 UNIT/ML injection 0.75 units per hour via insulin pump per pt - - USE AS DIRECTED (TRITRATE AS NEEDED) *MAX 50 UNITS A DAY*  5  . ondansetron (ZOFRAN) 4 MG tablet Take 1 tablet (4 mg total) by mouth every 6 (six) hours as needed for nausea or vomiting. 12 tablet 0  . oxyCODONE-acetaminophen (PERCOCET/ROXICET) 5-325 MG tablet Take 1 tablet by mouth every 6 (six) hours as needed for severe pain. 15 tablet 0  . pantoprazole (PROTONIX) 40 MG tablet Take 40 mg by mouth daily.    . promethazine (PHENERGAN) 25 MG tablet Take 25 mg by mouth every 6 (six) hours as needed for nausea or vomiting.    Marland Kitchen rOPINIRole (REQUIP) 0.5 MG tablet Take 0.5 mg by mouth 2 (two) times daily.     . tamsulosin (FLOMAX) 0.4 MG CAPS capsule Take 0.4 mg by mouth daily.    . traMADol (ULTRAM) 50 MG tablet Take 1 tablet (50 mg total) every 12 (twelve) hours as needed by mouth. 30 tablet 0    ROS: As per HPI otherwise negative.  Physical Exam: Vitals:   03/17/17 0939 03/17/17 0941 03/17/17 1045  BP:  (!) 198/107 (!) 200/110  Pulse:  95   Resp:  17   Temp:  (!) 97.4 F (36.3 C)   SpO2:  96%   Weight: 76.7 kg (169 lb)    Height: 5\' 11"  (1.803 m)       General: WDWN NAD Head: NCAT sclera not icteric MMM Neck: Supple. No JVD No masses Lungs: CTA bilaterally without wheezes, rales, or rhonchi. Breathing is unlabored. Heart: RRR with S1 S2 Abdomen: soft NT + BS Lower extremities:without edema or ischemic changes, no open wounds  Neuro: A & O  X 3. Moves all extremities spontaneously. Psych:  Responds to questions appropriately with a normal affect. Dialysis Access: L AVF   Labs: Basic Metabolic Panel: Recent Labs  Lab  03/17/17 0944 03/17/17 1133  NA 134* 138  K 3.2* 3.1*  CL 94* 95*  CO2 29  --   GLUCOSE 106* 87  BUN 12 13  CREATININE 6.58* 6.30*  CALCIUM 10.5*  --    Liver Function Tests: No results for input(s): AST, ALT, ALKPHOS, BILITOT, PROT, ALBUMIN in the last 168 hours. No results for input(s): LIPASE, AMYLASE in the last 168 hours. No results for input(s): AMMONIA in the last 168 hours. CBC: Recent Labs  Lab 03/17/17 0944 03/17/17 1133  WBC 9.1  --   HGB 12.6* 15.0  HCT 39.8 44.0  MCV 93.0  --   PLT 208  --    Cardiac Enzymes: No results for input(s): CKTOTAL, CKMB, CKMBINDEX, TROPONINI in the last 168 hours. CBG:  No results for input(s): GLUCAP in the last 168 hours. Iron Studies: No results for input(s): IRON, TIBC, TRANSFERRIN, FERRITIN in the last 72 hours. Studies/Results: Dg Chest 2 View  Result Date: 03/17/2017 CLINICAL DATA:  Nausea vomiting earlier today. EXAM: CHEST  2 VIEW COMPARISON:  11/29/2016 FINDINGS: Cardiac silhouette is normal in size. No mediastinal or hilar masses. No evidence of adenopathy. Mild interstitial thickening is noted most evident in the lower lungs, including thickening of the fissures. No lung consolidation to suggest pneumonia. No pleural effusion. No pneumothorax. Skeletal structures are intact. IMPRESSION: 1. Mild interstitial thickening, which appears mildly increased from prior exams. If there shortness of breath, consider mild interstitial pulmonary edema. 2. No convincing pneumonia. Electronically Signed   By: Lajean Manes M.D.   On: 03/17/2017 10:00    Dialysis Orders: [facility] MWF 4h  75.5kg   2K/2Ca    Profile 2     L AVF Heparin 5000 Venofer 50mg  IV q week  Calcitriol 2.17mcg PO q HD   Assessment/Plan: 1. Chest pain/CAD/s/p CABG 01/2017 - cards consult pending  2. Dyspnea/Hemopytsis - mild edema on CXR, plan HD today for volume removal  3. ESRD -  MWF. Extra treatment today as above. 4K bath  4. Hypertension/volume  - BP^^^ on  admission-  cont home meds/try to improve with UF as tolerated  5. Anemia  - Hgb 12.6 No ESA needs currently  6. Metabolic bone disease -  Ca 10.5, Hold Vit D/Ca acetate with Jamas Lav, Check P with renal panel  7. Nutrition - Renal diet/vitamins   Lynnda Child PA-C [facility] Pager [phone] 03/17/2017, 2:18 PM   Pt seen, examined and agree w A/P as above. ESRD pt with SOB and CP.  Had HD yest but here the CXR is showing pulm edema.  Plan HD overnight , get vol down, repeat CXR In am.  Lower dry wt as tolerated.  Kelly Splinter MD [employer] pager [phone]   03/17/2017, 5:48 PM

## 2017-03-17 NOTE — Progress Notes (Signed)
Jon Anderson is a 66 y.o. male patient admitted from ED awake, alert - oriented  X 4 - no acute distress noted.  VSS - Blood pressure (!) 201/96, pulse 82, temperature 97.8 F (36.6 C), resp. rate 18, height 5\' 11"  (1.803 m), weight 75.1 kg (165 lb 8 oz), SpO2 96 %.    IV in place, occlusive dsg intact without redness.  Orientation to room, and floor completed with information packet given to patient/family.  Patient declined safety video at this time.  Admission INP armband ID verified with patient/family, and in place.   SR up x 2, fall assessment complete, with patient and family able to verbalize understanding of risk associated with falls, and verbalized understanding to call nsg before up out of bed.  Call light within reach, patient able to voice, and demonstrate understanding.  Skin, clean-dry- intact without evidence of bruising, or skin tears.   No evidence of skin break down noted on exam.     Will cont to eval and treat per MD orders.  Milas Hock, RN 03/17/2017 5:29 PM

## 2017-03-17 NOTE — ED Provider Notes (Signed)
[facility] EMERGENCY DEPARTMENT Provider Note   CSN: [id number] Arrival date & time: 03/17/17  0932     History   Chief Complaint Chief Complaint  Patient presents with  . Chest Pain    HPI Jon Anderson is a 66 y.o. male history of end-stage renal disease (hemodialysis Monday/Wednesday/Friday at [facility] 2/2 to HTN - dialysis yesterday), CAD (CABG, LIMA-LAD, 01/30/17 by Dr. Lunette Stands), HTN, DM2, HLD who presents the ED today for chest pain that began at approximately 1 AM this morning.  Patient states that he woke up from sleep with a pressure in his chest on the left side without radiation to his neck, jaw, shoulders or back.  There is associated nausea and shortness of breath.  The pain is worse with exertion and also when lying down.  Patient notes that since onset he is also had associated cough and on 2 occasions he has spit up blood streaked sputum.  No gross hemoptysis.  He denies any recent nosebleeds.  No recent illnesses. Denies lower extremity swelling. The patient has not taken any of his medication this morning.   HPI  Past Medical History:  Diagnosis Date  . Anemia of chronic disease   . CAD (coronary artery disease)    a.  Myoview 4/11: EF 53%, no scar or ischemia   c. MV 2012 Nl perfusion, apical thinning.  No ischemia or scar.  EF 49%, appears greater by visual estimate.;  d.  Dob stress echo 12/13:  Negative Dob stress echo. There is no evidence of ischemia.  The LVF is normal. b. Normal cors 2016.  . Carotid stenosis    a. <99% RICA, >83% LICA by duplex 06/8248  . Chronic chest pain    occ  . ESRD (end stage renal disease) on dialysis ([place])   . GERD (gastroesophageal reflux disease)   . HTN (hypertension)    echo 3/10: EF 60%, LAE  . Hyperlipidemia   . Nephrolithiasis    "passed them all"  . Peripheral arterial disease ([place])    a. s/p PTCA to L SFA.  Marland Kitchen Pneumonia   . Restless legs   . Snores    a. presumed OSA, pt has refused  sleep eval in past.  . Type II diabetes mellitus [facility])     Patient Active Problem List   Diagnosis Date Noted  . Lumbar radiculopathy 01/16/2017  . Chest pain, non-cardiac 10/09/2015  . Acute on chronic renal failure ([place]) 06/16/2014  . Renal failure (ARF), acute on chronic (HCC) 06/16/2014  . Shoulder pain, left 12/15/2013  . Chest pain 12/15/2013  . Claudication ([place]) 12/11/2013  . PVD (peripheral vascular disease) ([place]) 12/11/2013  . Peripheral arterial disease ([place]) 09/30/2013  . Carotid artery disease ([place]) 09/30/2013  . Acute chest pain 11/15/2012  . Left-sided chest wall pain 03/19/2012  . Bruit 09/15/2010  . CAD (coronary artery disease) nonobstructive per cath 2012   . Chronic kidney disease (CKD), stage IV (severe) ([place])   . CHEST PAIN, PRECORDIAL 07/26/2009  . DM (diabetes mellitus) ([place]) 07/22/2009  . Hyperlipidemia LDL goal <70 07/22/2009  . Hypertension 07/22/2009    Past Surgical History:  Procedure Laterality Date  . ANGIOPLASTY / STENTING FEMORAL Left 12/11/2013   dr berry  . AV FISTULA PLACEMENT Left 03/19/2014   Procedure: CREATION OF ARTERIOVENOUS (AV) FISTULA  LEFT UPPER ARM;  Surgeon: Mal Misty, MD;  Location: [facility];  Service: Vascular;  Laterality: Left;  . CARDIAC CATHETERIZATION  2001  and 2010   . COLONOSCOPY W/ BIOPSIES AND POLYPECTOMY    . COLONOSCOPY WITH PROPOFOL N/A 08/01/2016   Procedure: COLONOSCOPY WITH PROPOFOL;  Surgeon: Carol Ada, MD;  Location: [facility] ENDOS[facility];  Service: Endoscopy;  Laterality: N/A;  . ESOPHAGOGASTRODUODENOSCOPY (EGD) WITH PROPOFOL N/A 08/01/2016   Procedure: ESOPHAGOGASTRODUODENOSCOPY (EGD) WITH PROPOFOL;  Surgeon: Carol Ada, MD;  Location: [facility]OS[facility];  Service: Endoscopy;  Laterality: N/A;  . FOOT FRACTURE SURGERY Right   . FRACTURE SURGERY    . INGUINAL HERNIA REPAIR Left   . LEFT HEART CATHETERIZATION WITH CORONARY ANGIOGRAM N/A 06/22/2014   Procedure: LEFT HEART CATHETERIZATION WITH CORONARY ANGIOGRAM;   Surgeon: Troy Sine, MD;  Location: [facility] CATH LAB;  Service: Cardiovascular;  Laterality: N/A;  . LOWER EXTREMITY ANGIOGRAM Left 12/11/2013   Procedure: LOWER EXTREMITY ANGIOGRAM;  Surgeon: Lorretta Harp, MD;  Location: [facility] CATH LAB;  Service: Cardiovascular;  Laterality: Left;  . TONSILLECTOMY AND ADENOIDECTOMY         Home Medications    Prior to Admission medications   Medication Sig Start Date End Date Taking? Authorizing Provider  aspirin EC 81 MG tablet Take 1 tablet (81 mg total) by mouth daily. 05/28/12   Richardson Dopp T, PA-C  atorvastatin (LIPITOR) 40 MG tablet Take 1 tablet (40 mg total) by mouth daily at 6 PM. 12/13/13   Brett Canales, PA-C  calcium acetate (PHOSLO) 667 MG capsule Take 3 capsules (2,001 mg total) by mouth 3 (three) times daily with meals. Patient taking differently: Take 2,001 mg by mouth 3 (three) times daily with meals. May take 667 -1334 mg with snacks 10/09/15   Janece Canterbury, MD  clonazePAM Bobbye [place]) 0.5 MG tablet 0.5-1mg  by mouth at bedtime 09/22/14   [provider]  clopidogrel (PLAVIX) 75 MG tablet Take 1 tablet (75 mg total) by mouth daily with breakfast. 12/13/13   Brett Canales, PA-C  gabapentin (NEURONTIN) 300 MG capsule Take 300 mg by mouth 2 (two) times daily. 09/25/16   [provider]  HYDROcodone-acetaminophen (NORCO) 5-325 MG tablet Take 1-2 tablets by mouth daily as needed. 01/16/17   Leandrew Koyanagi, MD  HYDROcodone-acetaminophen (NORCO) 7.5-325 MG tablet Take 1-2 tablets by mouth daily as needed for moderate pain. 01/23/17   Leandrew Koyanagi, MD  HYDROcodone-acetaminophen (NORCO/VICODIN) 5-325 MG tablet Take 1 tablet by mouth every 6 (six) hours as needed for moderate pain. 11/29/16   Milton Ferguson, MD  lisinopril (PRINIVIL,ZESTRIL) 20 MG tablet Take 20 mg by mouth at bedtime.    [provider]  multivitamin (RENA-VIT) TABS tablet Take 1 tablet by mouth at bedtime. 06/23/14   Domenic Polite, MD  naproxen (NAPROSYN) 500  MG tablet Take 1 tablet (500 mg total) by mouth 2 (two) times daily. 12/26/16   Elonda Giuliano, Barth Kirks, PA-C  nitroGLYCERIN (NITROSTAT) 0.4 MG SL tablet Place 1 tablet (0.4 mg total) under the tongue every 5 (five) minutes as needed for chest pain. 03/20/12   Barrett, Evelene Croon, PA-C  NOVOLOG 100 UNIT/ML injection 0.75 units per hour via insulin pump per pt - - USE AS DIRECTED (TRITRATE AS NEEDED) *MAX 50 UNITS A DAY* 08/28/14   [provider]  ondansetron (ZOFRAN) 4 MG tablet Take 1 tablet (4 mg total) by mouth every 6 (six) hours as needed for nausea or vomiting. 06/14/16   Forde Dandy, MD  oxyCODONE-acetaminophen (PERCOCET/ROXICET) 5-325 MG tablet Take 1 tablet by mouth every 6 (six) hours as needed for severe pain. 01/02/17  Recardo Evangelist, PA-C  pantoprazole (PROTONIX) 40 MG tablet Take 40 mg by mouth daily.    [provider]  promethazine (PHENERGAN) 25 MG tablet Take 25 mg by mouth every 6 (six) hours as needed for nausea or vomiting.    [provider]  rOPINIRole (REQUIP) 0.5 MG tablet Take 0.5 mg by mouth 2 (two) times daily.     [provider]  tamsulosin (FLOMAX) 0.4 MG CAPS capsule Take 0.4 mg by mouth daily.    [provider]  traMADol (ULTRAM) 50 MG tablet Take 1 tablet (50 mg total) every 12 (twelve) hours as needed by mouth. 02/23/17   Leandrew Koyanagi, MD    Family History Family History  Problem Relation Age of Onset  . Heart attack Sister        died @ 84  . Cancer Mother        died @ 77; unknown type  . Diabetes Brother        deceased  . Cirrhosis Father        alcohol related  . Diabetes Father   . Esophageal cancer Neg Hx   . Colon cancer Neg Hx   . Pancreatic cancer Neg Hx   . Stomach cancer Neg Hx     Social History Social History   Tobacco Use  . Smoking status: Former Smoker    Packs/day: 1.00    Years: 2.00    Pack years: 2.00    Types: Cigarettes  . Smokeless tobacco: Never Used  . Tobacco comment: quit  smoking 40 yrs ago  Substance Use Topics  . Alcohol use: No    Alcohol/week: 0.0 oz  . Drug use: No     Allergies   Kiwi extract and Tape   Review of Systems Review of Systems  All other systems reviewed and are negative.    Physical Exam Updated Vital Signs BP (!) 200/110 (BP Location: Right Arm)   Pulse 95   Temp (!) 97.4 F (36.3 C)   Resp 17   Ht 5\' 11"  (1.803 m)   Wt 76.7 kg (169 lb)   SpO2 96%   BMI 23.57 kg/m   Physical Exam  Constitutional: He appears well-developed and well-nourished.  HENT:  Head: Normocephalic and atraumatic.  Right Ear: External ear normal.  Left Ear: External ear normal.  Nose: Nose normal.  Mouth/Throat: Uvula is midline, oropharynx is clear and moist and mucous membranes are normal. No tonsillar exudate.  Eyes: Pupils are equal, round, and reactive to light. Right eye exhibits no discharge. Left eye exhibits no discharge. No scleral icterus.  Neck: Trachea normal. Neck supple. JVD present. No spinous process tenderness present. No neck rigidity. Normal range of motion present.  Cardiovascular: Normal rate, regular rhythm and intact distal pulses. Exam reveals gallop and S3.  No murmur heard. Pulses:      Radial pulses are 2+ on the right side, and 2+ on the left side.       Dorsalis pedis pulses are 2+ on the right side, and 2+ on the left side.       Posterior tibial pulses are 2+ on the right side, and 2+ on the left side.  No lower extremity swelling or edema. Calves symmetric in size bilaterally.  Pulmonary/Chest: Effort normal. He has decreased breath sounds in the right lower field and the left lower field. He exhibits no tenderness.  Abdominal: Soft. Bowel sounds are normal. There is no tenderness. There is no  rebound and no guarding.  Musculoskeletal: He exhibits no edema.  Lymphadenopathy:    He has no cervical adenopathy.  Neurological: He is alert.  Skin: Skin is warm and dry. No rash noted. He is not diaphoretic.    Psychiatric: He has a normal mood and affect.  Nursing note and vitals reviewed.    ED Treatments / Results  Labs (all labs ordered are listed, but only abnormal results are displayed) Labs Reviewed  BASIC METABOLIC PANEL - Abnormal; Notable for the following components:      Result Value   Sodium 134 (*)    Potassium 3.2 (*)    Chloride 94 (*)    Glucose, Bld 106 (*)    Creatinine, Ser 6.58 (*)    Calcium 10.5 (*)    GFR calc non Af Amer 8 (*)    GFR calc Af Amer 9 (*)    All other components within normal limits  CBC - Abnormal; Notable for the following components:   Hemoglobin 12.6 (*)    RDW 16.3 (*)    All other components within normal limits  BRAIN NATRIURETIC PEPTIDE - Abnormal; Notable for the following components:   B Natriuretic Peptide 3,394.7 (*)    All other components within normal limits  I-STAT CHEM 8, ED - Abnormal; Notable for the following components:   Potassium 3.1 (*)    Chloride 95 (*)    Creatinine, Ser 6.30 (*)    All other components within normal limits  TROPONIN I  TROPONIN I  TROPONIN I  HEMOGLOBIN A1C  I-STAT TROPONIN, ED    EKG  EKG Interpretation  Date/Time:  Saturday March 17 2017 10:21:29 EST Ventricular Rate:  103 PR Interval:  134 QRS Duration: 93 QT Interval:  386 QTC Calculation: 506 R Axis:   86 Text Interpretation:  Sinus tachycardia Anterior infarct, old Prolonged QT interval Baseline wander in lead(s) V2 No significant change since last tracing Confirmed by Wandra Arthurs [phone]) on 03/17/2017 10:30:07 AM       Radiology Dg Chest 2 View  Result Date: 03/17/2017 CLINICAL DATA:  Nausea vomiting earlier today. EXAM: CHEST  2 VIEW COMPARISON:  11/29/2016 FINDINGS: Cardiac silhouette is normal in size. No mediastinal or hilar masses. No evidence of adenopathy. Mild interstitial thickening is noted most evident in the lower lungs, including thickening of the fissures. No lung consolidation to suggest pneumonia. No  pleural effusion. No pneumothorax. Skeletal structures are intact. IMPRESSION: 1. Mild interstitial thickening, which appears mildly increased from prior exams. If there shortness of breath, consider mild interstitial pulmonary edema. 2. No convincing pneumonia. Electronically Signed   By: Lajean Manes M.D.   On: 03/17/2017 10:00    Procedures Procedures (including critical care time)  Medications Ordered in ED Medications  lisinopril (PRINIVIL,ZESTRIL) tablet 20 mg (not administered)  metoprolol tartrate (LOPRESSOR) tablet 25 mg (not administered)  nitroGLYCERIN (NITROSTAT) SL tablet 0.4 mg (not administered)  aspirin chewable tablet 324 mg (not administered)     Initial Impression / Assessment and Plan / ED Course  I have reviewed the triage vital signs and the nursing notes.  Pertinent labs & imaging results that were available during my care of the patient were reviewed by me and considered in my medical decision making (see chart for details).     66 y.o. male history of end-stage renal disease (hemodialysis Monday/Wednesday/Friday at [facility] 2/2 to HTN - dialysis yesterday), CAD (CABG, LIMA-LAD, 01/30/17 by Dr. Lunette Stands), HTN, DM2, HLD who presents  the ED today for chest pain that began at approximately 1 AM this morning.  Patient states that he woke up from sleep with a pressure in his chest on the left side without radiation to his neck, jaw, shoulders or back.  There is associated nausea and shortness of breath.  The pain is worse with exertion and also when lying down.  Patient notes that since onset he is also had associated cough and on 2 occasions he has spit up blood streaked sputum.  No gross hemoptysis.  Patient has been discharge from before his cardiothoracic surgeon.  On presentation the patient is noted to be hypertensive at 198/107.  He is afebrile, without tachycardia or tachypnea.  No hypoxia.  Patient is noted to have JVD, S3 gallop and decreased lung sounds at  bases.  Patient not taking home blood pressure medication.  Will administer.  Will also give nitroglycerin and 324mg  aspirin.  Will obtain EKG, chest x-ray, troponin, CBC, BMP, BNP.   Patient given ASA 324mg  and nitroglycerin. Patient says no improvement of chest pain with Nitro.  EKG shows sinus tachycardia with prolonged QT interval 386.  No significant change since last tracing.  Chest x-ray shows mild interstitial pulmonary edema.  BNP elevated at 3394.7.  Suspect new onset heart failure after recent CABG.  Will consult nephrology for dialysis.  Will consult cardiology to discuss new onset heart failure after recent CABG. Patient has been released from [facility], does not have a cardiologist in [place] and would not like to go back to [facility].   Patient noting continued pain. Morphine given for pain with improvement.   Dr. Sherryll Burger of nephrology agreed to the patient to the dialysis list and follow the patient.  Dr. Terrence Dupont of cardiology consulted and will consult on the patient. Hospitalist Erin Hearing to to admit the patient. Patient made aware of plan and are in agreement.   Dr. Terrence Dupont stated that the patient has been seen by Dr. Burt Knack in the past.  I consulted with Dr. Antionette Char practice and Dr. Harrington Challenger return my consult.  They agree to see the patient and consult on them while they are admitted to the hospitalist service.  Final Clinical Impressions(s) / ED Diagnoses   Final diagnoses:  Hx of CABG  Acute heart failure, unspecified heart failure type [facility])  Chest pain, unspecified type  End stage renal disease on dialysis [facility])    ED Discharge Orders    None       Lorelle Gibbs 03/17/17 1553    Drenda Freeze, MD 03/18/17 [phone]

## 2017-03-18 ENCOUNTER — Inpatient Hospital Stay (HOSPITAL_COMMUNITY): Payer: Medicare Other

## 2017-03-18 DIAGNOSIS — R079 Chest pain, unspecified: Secondary | ICD-10-CM

## 2017-03-18 DIAGNOSIS — E785 Hyperlipidemia, unspecified: Secondary | ICD-10-CM

## 2017-03-18 DIAGNOSIS — D638 Anemia in other chronic diseases classified elsewhere: Secondary | ICD-10-CM

## 2017-03-18 DIAGNOSIS — I509 Heart failure, unspecified: Secondary | ICD-10-CM

## 2017-03-18 DIAGNOSIS — Z992 Dependence on renal dialysis: Secondary | ICD-10-CM

## 2017-03-18 DIAGNOSIS — I10 Essential (primary) hypertension: Secondary | ICD-10-CM

## 2017-03-18 DIAGNOSIS — E782 Mixed hyperlipidemia: Secondary | ICD-10-CM

## 2017-03-18 DIAGNOSIS — Z951 Presence of aortocoronary bypass graft: Secondary | ICD-10-CM

## 2017-03-18 DIAGNOSIS — N186 End stage renal disease: Secondary | ICD-10-CM

## 2017-03-18 DIAGNOSIS — I5033 Acute on chronic diastolic (congestive) heart failure: Secondary | ICD-10-CM

## 2017-03-18 LAB — GLUCOSE, CAPILLARY
Glucose-Capillary: 102 mg/dL — ABNORMAL HIGH (ref 65–99)
Glucose-Capillary: 110 mg/dL — ABNORMAL HIGH (ref 65–99)
Glucose-Capillary: 69 mg/dL (ref 65–99)
Glucose-Capillary: 78 mg/dL (ref 65–99)
Glucose-Capillary: 84 mg/dL (ref 65–99)
Glucose-Capillary: 96 mg/dL (ref 65–99)
Glucose-Capillary: 97 mg/dL (ref 65–99)

## 2017-03-18 LAB — COMPREHENSIVE METABOLIC PANEL
ALT: 10 U/L — ABNORMAL LOW (ref 17–63)
AST: 15 U/L (ref 15–41)
Albumin: 3.2 g/dL — ABNORMAL LOW (ref 3.5–5.0)
Alkaline Phosphatase: 61 U/L (ref 38–126)
Anion gap: 9 (ref 5–15)
BUN: 12 mg/dL (ref 6–20)
CO2: 31 mmol/L (ref 22–32)
Calcium: 10.8 mg/dL — ABNORMAL HIGH (ref 8.9–10.3)
Chloride: 95 mmol/L — ABNORMAL LOW (ref 101–111)
Creatinine, Ser: 6.47 mg/dL — ABNORMAL HIGH (ref 0.61–1.24)
GFR calc Af Amer: 9 mL/min — ABNORMAL LOW (ref >60)
GFR calc non Af Amer: 8 mL/min — ABNORMAL LOW (ref >60)
Glucose, Bld: 83 mg/dL (ref 65–99)
Potassium: 3.6 mmol/L (ref 3.5–5.1)
Sodium: 135 mmol/L (ref 135–145)
Total Bilirubin: 0.6 mg/dL (ref 0.3–1.2)
Total Protein: 6.4 g/dL — ABNORMAL LOW (ref 6.5–8.1)

## 2017-03-18 LAB — CBC WITH DIFFERENTIAL/PLATELET
Basophils Absolute: 0 10*3/uL (ref 0.0–0.1)
Basophils Relative: 0 %
Eosinophils Absolute: 0.4 10*3/uL (ref 0.0–0.7)
Eosinophils Relative: 8 %
HCT: 38.5 % — ABNORMAL LOW (ref 39.0–52.0)
Hemoglobin: 12.2 g/dL — ABNORMAL LOW (ref 13.0–17.0)
Lymphocytes Relative: 47 %
Lymphs Abs: 2.6 10*3/uL (ref 0.7–4.0)
MCH: 29.7 pg (ref 26.0–34.0)
MCHC: 31.7 g/dL (ref 30.0–36.0)
MCV: 93.7 fL (ref 78.0–100.0)
Monocytes Absolute: 0.5 10*3/uL (ref 0.1–1.0)
Monocytes Relative: 9 %
Neutro Abs: 2 10*3/uL (ref 1.7–7.7)
Neutrophils Relative %: 36 %
Platelets: 185 10*3/uL (ref 150–400)
RBC: 4.11 MIL/uL — ABNORMAL LOW (ref 4.22–5.81)
RDW: 16.3 % — ABNORMAL HIGH (ref 11.5–15.5)
WBC: 5.5 10*3/uL (ref 4.0–10.5)

## 2017-03-18 LAB — URINALYSIS, ROUTINE W REFLEX MICROSCOPIC
Bilirubin Urine: NEGATIVE
Glucose, UA: 150 mg/dL — AB
Hgb urine dipstick: NEGATIVE
Ketones, ur: NEGATIVE mg/dL
Leukocytes, UA: NEGATIVE
Nitrite: NEGATIVE
Protein, ur: >=300 mg/dL — AB
Specific Gravity, Urine: 1.007 (ref 1.005–1.030)
pH: 9 — ABNORMAL HIGH (ref 5.0–8.0)

## 2017-03-18 LAB — BLOOD GAS, ARTERIAL
Acid-Base Excess: 7.6 mmol/L — ABNORMAL HIGH (ref 0.0–2.0)
Bicarbonate: 32 mmol/L — ABNORMAL HIGH (ref 20.0–28.0)
Drawn by: 270221
FIO2: 0.21
O2 Saturation: 92.7 %
Patient temperature: 98.6
pCO2 arterial: 49.2 mmHg — ABNORMAL HIGH (ref 32.0–48.0)
pH, Arterial: 7.43 (ref 7.350–7.450)
pO2, Arterial: 71.7 mmHg — ABNORMAL LOW (ref 83.0–108.0)

## 2017-03-18 LAB — STREP PNEUMONIAE URINARY ANTIGEN: Strep Pneumo Urinary Antigen: NEGATIVE

## 2017-03-18 LAB — MRSA PCR SCREENING: MRSA by PCR: NEGATIVE

## 2017-03-18 LAB — MAGNESIUM: Magnesium: 1.9 mg/dL (ref 1.7–2.4)

## 2017-03-18 MED ORDER — LISINOPRIL 20 MG PO TABS
20.0000 mg | ORAL_TABLET | Freq: Every day | ORAL | Status: DC
  Administered 2017-03-18 – 2017-03-21 (×4): 20 mg via ORAL
  Filled 2017-03-18 (×4): qty 1

## 2017-03-18 MED ORDER — HYDROCODONE-ACETAMINOPHEN 5-325 MG PO TABS: 1.0000 | ORAL_TABLET | Freq: Four times a day (QID) | ORAL | Status: DC | PRN

## 2017-03-18 MED ORDER — AMLODIPINE BESYLATE 2.5 MG PO TABS
2.5000 mg | ORAL_TABLET | Freq: Every day | ORAL | Status: DC
  Administered 2017-03-18: 2.5 mg via ORAL
  Filled 2017-03-18: qty 1

## 2017-03-18 MED ORDER — VANCOMYCIN HCL 10 G IV SOLR
1500.0000 mg | Freq: Two times a day (BID) | INTRAVENOUS | Status: DC
  Administered 2017-03-18 – 2017-03-19 (×2): 1500 mg via INTRAVENOUS
  Filled 2017-03-18 (×2): qty 1500

## 2017-03-18 MED ORDER — HYDRALAZINE HCL 20 MG/ML IJ SOLN
5.0000 mg | Freq: Four times a day (QID) | INTRAMUSCULAR | Status: DC | PRN
  Administered 2017-03-18 – 2017-03-19 (×2): 5 mg via INTRAVENOUS
  Filled 2017-03-18 (×2): qty 1

## 2017-03-18 MED ORDER — DEXTROSE 5 % IV SOLN
1.0000 g | INTRAVENOUS | Status: DC
  Administered 2017-03-18 – 2017-03-19 (×2): 1 g via INTRAVENOUS
  Filled 2017-03-18 (×4): qty 1

## 2017-03-18 MED ORDER — INSULIN ASPART 100 UNIT/ML ~~LOC~~ SOLN
0.0000 [IU] | SUBCUTANEOUS | Status: DC
  Administered 2017-03-19 – 2017-03-22 (×3): 1 [IU] via SUBCUTANEOUS

## 2017-03-18 NOTE — Progress Notes (Signed)
Patient ID: Jon Anderson, male   DOB: 04-10-1951, 66 y.o.   MRN: [id number]  PROGRESS NOTE    Jon Anderson  RWE:[id number] DOB: 06/22/1950 DOA: 03/17/2017 PCP: [name], MD   Brief Narrative:  66 year old male with history of end-stage renal disease on dialysis, chronic diastolic heart failure, coronary artery disease status post recent CABG in October 2018 at [facility], hypertension, anemia of chronic disease, dyslipidemia and diabetes presented with chest pain and shortness of breath.  He was found to be hypertensive and was admitted with heart failure.  Nephrology and cardiology were consulted.   Assessment & Plan:   Principal Problem:   Chest pain Active Problems:   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/[facility]   Anemia of chronic disease   Severe uncontrolled hypertension   ESRD (end stage renal disease) on dialysis (HCC)   Hyperlipidemia   Chronic chest pain   Type II diabetes mellitus (HCC)   Acute on chronic diastolic heart failure (HCC)   Acute heart failure (HCC)  Chest pain -Troponin minimally elevated but flat.  Cardiology following.  Probably secondary to elevated BP and heart failure -Echo pending  Acute on chronic diastolic heart failure -Cardiology following.  Echo pending.  Fluid being managed by dialysis.  Continue lisinopril, metoprolol -Daily weights.  Strict input and output  End-stage renal disease on home dialysis -Nephrology following.  Coronary artery disease status post recent CABG -Continue aspirin, Plavix, statin, metoprolol, lisinopril, amlodipine  Dyslipidemia -Continue Lipitor  Diabetes mellitus type 2 -Apparently he is off insulin pump recently.  Continue Accu-Cheks with coverage  Anemia of chronic disease from renal failure -Hemoglobin stable  Generalized deconditioning -PT/OT eval   DVT prophylaxis: Subcutaneous heparin Code Status: Full Family Communication: None at bedside Disposition Plan: Probable home in 1-2 days  Consultants:  Cardiology and nephrology  Procedures: Echo pending  Antimicrobials: None   Subjective: Patient seen and examined at bedside.  He is slightly awake but does not answer much questions.  He denies current chest pain.  No overnight fever or vomiting  Objective: Vitals:   03/18/17 0117 03/18/17 0542 03/18/17 0805 03/18/17 1119  BP: (!) 169/103 (!) 167/71 (!) 159/82 (!) 153/83  Pulse: 91 77 71 73  Resp: 16 16    Temp: 97.7 F (36.5 C) 97.9 F (36.6 C) (!) 97.2 F (36.2 C) 97.8 F (36.6 C)  TempSrc:  Oral Oral Axillary  SpO2:  98% (!) 84% 100%  Weight:      Height:        Intake/Output Summary (Last 24 hours) at 03/18/2017 1152 Last data filed at 03/18/2017 1051 Gross per 24 hour  Intake 240 ml  Output 3000 ml  Net -2760 ml   Filed Weights   03/17/17 1634 03/17/17 2100 03/18/17 0006  Weight: 75.1 kg (165 lb 8 oz) 73.8 kg (162 lb 11.2 oz) 70.4 kg (155 lb 3.3 oz)    Examination:  General exam: Appears calm and comfortable.  Sleepy, wakes up but does not answer much questions Respiratory system: Bilateral decreased breath sound at bases with some scattered crackles Cardiovascular system: S1 & S2 heard, rate controlled  gastrointestinal system: Abdomen is nondistended, soft and nontender. Normal bowel sounds heard. Extremities: No cyanosis, clubbing, edema     Data Reviewed: I have personally reviewed following labs and imaging studies  CBC: Recent Labs  Lab 03/17/17 0944 03/17/17 1133  WBC 9.1  --   HGB 12.6* 15.0  HCT 39.8 44.0  MCV 93.0  --  PLT 208  --    Basic Metabolic Panel: Recent Labs  Lab 03/17/17 0944 03/17/17 1133  NA 134* 138  K 3.2* 3.1*  CL 94* 95*  CO2 29  --   GLUCOSE 106* 87  BUN 12 13  CREATININE 6.58* 6.30*  CALCIUM 10.5*  --    GFR: Estimated Creatinine Clearance: 11.3 mL/min (A) (by C-G formula based on SCr of 6.3 mg/dL (H)). Liver Function Tests: No results for input(s): AST, ALT, ALKPHOS, BILITOT, PROT, ALBUMIN in the last  168 hours. No results for input(s): LIPASE, AMYLASE in the last 168 hours. No results for input(s): AMMONIA in the last 168 hours. Coagulation Profile: No results for input(s): INR, PROTIME in the last 168 hours. Cardiac Enzymes: Recent Labs  Lab 03/17/17 1523 03/17/17 1815 03/17/17 2116  TROPONINI 0.06* 0.07* 0.07*   BNP (last 3 results) No results for input(s): PROBNP in the last 8760 hours. HbA1C: Recent Labs    03/17/17 1523  HGBA1C 4.8   CBG: Recent Labs  Lab 03/18/17 0114 03/18/17 0541 03/18/17 0802 03/18/17 1115  GLUCAP 69 102* 97 110*   Lipid Profile: No results for input(s): CHOL, HDL, LDLCALC, TRIG, CHOLHDL, LDLDIRECT in the last 72 hours. Thyroid Function Tests: No results for input(s): TSH, T4TOTAL, FREET4, T3FREE, THYROIDAB in the last 72 hours. Anemia Panel: No results for input(s): VITAMINB12, FOLATE, FERRITIN, TIBC, IRON, RETICCTPCT in the last 72 hours. Sepsis Labs: No results for input(s): PROCALCITON, LATICACIDVEN in the last 168 hours.  No results found for this or any previous visit (from the past 240 hour(s)).       Radiology Studies: Dg Chest 2 View  Result Date: 03/17/2017 CLINICAL DATA:  Nausea vomiting earlier today. EXAM: CHEST  2 VIEW COMPARISON:  11/29/2016 FINDINGS: Cardiac silhouette is normal in size. No mediastinal or hilar masses. No evidence of adenopathy. Mild interstitial thickening is noted most evident in the lower lungs, including thickening of the fissures. No lung consolidation to suggest pneumonia. No pleural effusion. No pneumothorax. Skeletal structures are intact. IMPRESSION: 1. Mild interstitial thickening, which appears mildly increased from prior exams. If there shortness of breath, consider mild interstitial pulmonary edema. 2. No convincing pneumonia. Electronically Signed   By: Lajean Manes M.D.   On: 03/17/2017 10:00        Scheduled Meds: . amLODipine  2.5 mg Oral Daily  . aspirin EC  81 mg Oral Daily  .  atorvastatin  40 mg Oral q1800  . calcium acetate  2,001 mg Oral TID WC  . clopidogrel  75 mg Oral Q breakfast  . gabapentin  300 mg Oral BID  . heparin  5,000 Units Subcutaneous Q8H  . insulin aspart  0-9 Units Subcutaneous Q4H  . lisinopril  20 mg Oral QHS  . metoprolol tartrate  12.5 mg Oral BID  . multivitamin  1 tablet Oral QHS  . pantoprazole  40 mg Oral QHS  . rOPINIRole  0.5 mg Oral QHS  . [START ON 03/19/2017] rOPINIRole  0.5 mg Oral Q M,W,F   Continuous Infusions: . sodium chloride    . sodium chloride       LOS: 1 day        Aline August, MD [facility] Pager [phone]  If 7PM-7AM, please contact night-coverage [email or web address] Password [email or web address] 03/18/2017, 11:52 AM

## 2017-03-18 NOTE — Care Management Note (Signed)
Case Management Note  Patient Details  Name: HOLLY PRING MRN: [id number] Date of Birth: 06-01-1950  Subjective/Objective:       Pt presented for CP.  Pt from home with wife and daughter.  Pt states he has used [facility] PT services as recently as 2-3 weeks ago with Alvis Lemmings. Pt has walker and cane at home.  Information from pt limited as pt is very sleepy, falling asleep mid-sentence.  RN aware.   Pt does not use O2 at home.  Pt on RA with O2 sats 97% during assessment.        Action/Plan: CM will continue to follow and may resume services with [facility] as needed.    Expected Discharge Date:                  Expected Discharge Plan:  [facility]  In-House Referral:  NA  Discharge planning Services  CM Consult  Post Acute Care Choice:    Choice offered to:     DME Arranged:    DME Agency:     HH Arranged:    HH Agency:     Status of Service:  In process, will continue to follow  If discussed at Long Length of Stay Meetings, dates discussed:    Additional Comments:  Arley Phenix, RN 03/18/2017, 1:20 PM

## 2017-03-18 NOTE — Progress Notes (Signed)
  Echocardiogram 2D Echocardiogram has been performed.  Johny Chess 03/18/2017, 6:11 PM

## 2017-03-18 NOTE — Progress Notes (Signed)
Pharmacy Antibiotic Note  Jon Anderson is a 66 y.o. male admitted on 03/17/2017 with pneumonia.   Plan: Vanc 1500 mg x 1  Cefepime 1 g q24h F/U HD Plans for further dosing Monitor schedule, cx, vanc lvls prn  Height: 5\' 11"  (180.3 cm) Weight: 155 lb 3.3 oz (70.4 kg) IBW/kg (Calculated) : 75.3  Temp (24hrs), Avg:97.7 F (36.5 C), Min:97.2 F (36.2 C), Max:97.9 F (36.6 C)  Recent Labs  Lab 03/17/17 0944 03/17/17 1133 03/18/17 1523  WBC 9.1  --  5.5  CREATININE 6.58* 6.30*  --     Estimated Creatinine Clearance: 11.3 mL/min (A) (by C-G formula based on SCr of 6.3 mg/dL (H)).    Allergies  Allergen Reactions  . Kiwi Extract Swelling    Facial swelling   . Tape Other (See Comments)    Plastic tape-blistering   Levester Fresh, PharmD, BCPS, BCCCP Clinical Pharmacist Clinical phone for 03/18/2017 from 7a-3:30p: [phone] If after 3:30p, please call main pharmacy at: [phone] 03/18/2017 4:31 PM

## 2017-03-18 NOTE — Progress Notes (Addendum)
Progress Note  Patient Name: Jon Anderson Date of Encounter: 03/18/2017  Primary Cardiologist: Pt most recently seen at [facility] ([facility] surgery and cardiology)  ? F/u  Says he does not want to go back  Subjective   Sleepy  Denies CP  Breathing OK    Inpatient Medications    Scheduled Meds: . aspirin EC  81 mg Oral Daily  . atorvastatin  40 mg Oral q1800  . calcium acetate  2,001 mg Oral TID WC  . clopidogrel  75 mg Oral Q breakfast  . gabapentin  300 mg Oral BID  . heparin  5,000 Units Subcutaneous Q8H  . insulin aspart  0-9 Units Subcutaneous Q4H  . lisinopril  20 mg Oral QHS  . metoprolol tartrate  12.5 mg Oral BID  . multivitamin  1 tablet Oral QHS  . pantoprazole  40 mg Oral QHS  . rOPINIRole  0.5 mg Oral QHS  . [START ON 03/19/2017] rOPINIRole  0.5 mg Oral Q M,W,F   Continuous Infusions: . sodium chloride    . sodium chloride     PRN Meds: sodium chloride, sodium chloride, acetaminophen, clonazePAM, cyclobenzaprine, heparin, heparin, HYDROcodone-acetaminophen, lidocaine (PF), lidocaine-prilocaine, nitroGLYCERIN, ondansetron (ZOFRAN) IV, pentafluoroprop-tetrafluoroeth   Vital Signs    Vitals:   03/18/17 0006 03/18/17 0117 03/18/17 0542 03/18/17 0805  BP: (!) 172/113 (!) 169/103 (!) 167/71 (!) 159/82  Pulse: 88 91 77 71  Resp: 18 16 16    Temp: 97.9 F (36.6 C) 97.7 F (36.5 C) 97.9 F (36.6 C) (!) 97.2 F (36.2 C)  TempSrc: Oral  Oral Oral  SpO2:   98% (!) 84%  Weight: 155 lb 3.3 oz (70.4 kg)     Height:        Intake/Output Summary (Last 24 hours) at 03/18/2017 0849 Last data filed at 03/18/2017 0006 Gross per 24 hour  Intake -  Output 3000 ml  Net -3000 ml   Filed Weights   03/17/17 1634 03/17/17 2100 03/18/17 0006  Weight: 165 lb 8 oz (75.1 kg) 162 lb 11.2 oz (73.8 kg) 155 lb 3.3 oz (70.4 kg)    Telemetry    SR   - Personally Reviewed  ECG      Physical Exam   GEN:  Thin 66 yo in NAD   Neck: No JVD Cardiac: RRR, no murmurs, rubs, or  gallops.  Respiratory: Clear to auscultation bilaterally. GI: Soft, nontender, non-distended  MS: No edema; No deformity. Neuro:  Nonfocal  Psych: Normal affect   Labs    Chemistry Recent Labs  Lab 03/17/17 0944 03/17/17 1133  NA 134* 138  K 3.2* 3.1*  CL 94* 95*  CO2 29  --   GLUCOSE 106* 87  BUN 12 13  CREATININE 6.58* 6.30*  CALCIUM 10.5*  --   GFRNONAA 8*  --   GFRAA 9*  --   ANIONGAP 11  --      Hematology Recent Labs  Lab 03/17/17 0944 03/17/17 1133  WBC 9.1  --   RBC 4.28  --   HGB 12.6* 15.0  HCT 39.8 44.0  MCV 93.0  --   MCH 29.4  --   MCHC 31.7  --   RDW 16.3*  --   PLT 208  --     Cardiac Enzymes Recent Labs  Lab 03/17/17 1523 03/17/17 1815 03/17/17 2116  TROPONINI 0.06* 0.07* 0.07*    Recent Labs  Lab 03/17/17 0942  TROPIPOC 0.04     BNP Recent Labs  Lab 03/17/17 1124  BNP 3,394.7*     DDimer No results for input(s): DDIMER in the last 168 hours.   Radiology    Dg Chest 2 View  Result Date: 03/17/2017 CLINICAL DATA:  Nausea vomiting earlier today. EXAM: CHEST  2 VIEW COMPARISON:  11/29/2016 FINDINGS: Cardiac silhouette is normal in size. No mediastinal or hilar masses. No evidence of adenopathy. Mild interstitial thickening is noted most evident in the lower lungs, including thickening of the fissures. No lung consolidation to suggest pneumonia. No pleural effusion. No pneumothorax. Skeletal structures are intact. IMPRESSION: 1. Mild interstitial thickening, which appears mildly increased from prior exams. If there shortness of breath, consider mild interstitial pulmonary edema. 2. No convincing pneumonia. Electronically Signed   By: Lajean Manes M.D.   On: 03/17/2017 10:00    Cardiac Studies     Patient Profile    Assessment & Plan    1  Chest pain  Pt denies   Troponiin minimally elevated and flat  I think spell yesterday prob related to fluid and BP out of control  I am not convinced of active ischemia    2  Acute  CHF   Echo pending   Echo in 2017 normal LVEF   May be related to BP  Add amlodipine  Follow with dialysis Echo ordered WIll add O2  O2 sats marginal  Dip when sleeping    2  HTN  CXR with pulmonary edema in setting of HTN  BP is better after HD but still high  On home meds   Would add 2.5 amlodipine and follow    3  HL Continue Lipitor 40    4  CAD  S/p Off pump CABG (LIMA to LAD) in Oct 2018  5.  ESRD  Renal following    For questions or updates, please contact [place] Please consult [email or web address] for contact info under Cardiology/STEMI.      Signed, Dorris Carnes, MD  03/18/2017, 8:49 AM

## 2017-03-18 NOTE — Progress Notes (Signed)
[facility] Progress Note  Subjective: got 3L net off w HD yest. Very drowsy today and can't stay awake to answer questions.   Vitals:   03/18/17 0117 03/18/17 0542 03/18/17 0805 03/18/17 1119  BP: (!) 169/103 (!) 167/71 (!) 159/82 (!) 153/83  Pulse: 91 77 71 73  Resp: 16 16    Temp: 97.7 F (36.5 C) 97.9 F (36.6 C) (!) 97.2 F (36.2 C) 97.8 F (36.6 C)  TempSrc:  Oral Oral Axillary  SpO2:  98% (!) 84% 100%  Weight:      Height:        Inpatient medications: . amLODipine  2.5 mg Oral Daily  . aspirin EC  81 mg Oral Daily  . atorvastatin  40 mg Oral q1800  . calcium acetate  2,001 mg Oral TID WC  . clopidogrel  75 mg Oral Q breakfast  . heparin  5,000 Units Subcutaneous Q8H  . insulin aspart  0-9 Units Subcutaneous Q4H  . lisinopril  20 mg Oral QHS  . metoprolol tartrate  12.5 mg Oral BID  . multivitamin  1 tablet Oral QHS  . pantoprazole  40 mg Oral QHS    acetaminophen, hydrALAZINE, nitroGLYCERIN, ondansetron (ZOFRAN) IV  Exam:  General: WDWN NAD Head: NCAT sclera not icteric MMM Neck: Supple. No JVD No masses Lungs: CTA bilaterally without wheezes, rales, or rhonchi. Breathing is unlabored. Heart: RRR with S1 S2 Abdomen: soft NT + BS Lower extremities:without edema or ischemic changes, no open wounds  Neuro: A & O  X 3. Moves all extremities spontaneously. Psych:  Responds to questions appropriately with a normal affect. Dialysis Access: L AVF    Dialysis Orders: [facility] MWF 4h  75.5kg   2K/2Ca    Profile 2     L AVF Heparin 5000 Venofer 50mg  IV q week  Calcitriol 2.80mcg PO q HD   Assessment: 1. Dyspnea/pulm edema - had HD yest with 3 L off, breathing is better. Is under dry wt by 5kg, losing body wt.  Plan for usual HD tomorrow w/ further UF as tolerated. Lower dry wt. Should be ok for dc tomorrow after HD.  2. Lethargy - getting numerous sedating meds, have d/w primary , will hold for now. 3. Chest pain/CAD/s/p CABG 01/2017 - per  primary 4. ESRD - usual HD is MWF  5. HTN - BP's still up on norvasc/ acei/ BB 6. Anemia  - Hgb 12.6 No ESA needs currently  7. Metabolic bone disease -  Ca 10.5, Hold Vit D/Ca acetate with Jamas Lav, Check P with renal panel  8. Nutrition - Renal diet/vitamins  Plan - HD Monday, lower dry wt as above   Kelly Splinter MD [place] pager [phone]   03/18/2017, 4:28 PM   Recent Labs  Lab 03/17/17 0944 03/17/17 1133  NA 134* 138  K 3.2* 3.1*  CL 94* 95*  CO2 29  --   GLUCOSE 106* 87  BUN 12 13  CREATININE 6.58* 6.30*  CALCIUM 10.5*  --    No results for input(s): AST, ALT, ALKPHOS, BILITOT, PROT, ALBUMIN in the last 168 hours. Recent Labs  Lab 03/17/17 0944 03/17/17 1133 03/18/17 1523  WBC 9.1  --  5.5  NEUTROABS  --   --  2.0  HGB 12.6* 15.0 12.2*  HCT 39.8 44.0 38.5*  MCV 93.0  --  93.7  PLT 208  --  185   Iron/TIBC/Ferritin/ %Sat    Component Value Date/Time   IRON 58  06/16/2014 1334   TIBC 219 06/16/2014 1334   FERRITIN 172 06/16/2014 1334   IRONPCTSAT 26 06/16/2014 1334

## 2017-03-19 ENCOUNTER — Inpatient Hospital Stay (HOSPITAL_COMMUNITY): Payer: Medicare Other

## 2017-03-19 DIAGNOSIS — J189 Pneumonia, unspecified organism: Secondary | ICD-10-CM

## 2017-03-19 DIAGNOSIS — R072 Precordial pain: Secondary | ICD-10-CM

## 2017-03-19 LAB — CBC WITH DIFFERENTIAL/PLATELET
Basophils Absolute: 0 10*3/uL (ref 0.0–0.1)
Basophils Relative: 0 %
Eosinophils Absolute: 0.6 10*3/uL (ref 0.0–0.7)
Eosinophils Relative: 8 %
HCT: 38.8 % — ABNORMAL LOW (ref 39.0–52.0)
Hemoglobin: 12 g/dL — ABNORMAL LOW (ref 13.0–17.0)
Lymphocytes Relative: 43 %
Lymphs Abs: 3.2 10*3/uL (ref 0.7–4.0)
MCH: 29 pg (ref 26.0–34.0)
MCHC: 30.9 g/dL (ref 30.0–36.0)
MCV: 93.7 fL (ref 78.0–100.0)
Monocytes Absolute: 0.5 10*3/uL (ref 0.1–1.0)
Monocytes Relative: 7 %
Neutro Abs: 3 10*3/uL (ref 1.7–7.7)
Neutrophils Relative %: 42 %
Platelets: 205 10*3/uL (ref 150–400)
RBC: 4.14 MIL/uL — ABNORMAL LOW (ref 4.22–5.81)
RDW: 16.3 % — ABNORMAL HIGH (ref 11.5–15.5)
WBC: 7.3 10*3/uL (ref 4.0–10.5)

## 2017-03-19 LAB — GLUCOSE, CAPILLARY
Glucose-Capillary: 108 mg/dL — ABNORMAL HIGH (ref 65–99)
Glucose-Capillary: 113 mg/dL — ABNORMAL HIGH (ref 65–99)
Glucose-Capillary: 131 mg/dL — ABNORMAL HIGH (ref 65–99)
Glucose-Capillary: 70 mg/dL (ref 65–99)
Glucose-Capillary: 72 mg/dL (ref 65–99)

## 2017-03-19 LAB — BASIC METABOLIC PANEL
Anion gap: 12 (ref 5–15)
BUN: 18 mg/dL (ref 6–20)
CO2: 28 mmol/L (ref 22–32)
Calcium: 11.3 mg/dL — ABNORMAL HIGH (ref 8.9–10.3)
Chloride: 95 mmol/L — ABNORMAL LOW (ref 101–111)
Creatinine, Ser: 7.57 mg/dL — ABNORMAL HIGH (ref 0.61–1.24)
GFR calc Af Amer: 8 mL/min — ABNORMAL LOW (ref >60)
GFR calc non Af Amer: 7 mL/min — ABNORMAL LOW (ref >60)
Glucose, Bld: 71 mg/dL (ref 65–99)
Potassium: 3.7 mmol/L (ref 3.5–5.1)
Sodium: 135 mmol/L (ref 135–145)

## 2017-03-19 LAB — URINE CULTURE: Culture: NO GROWTH

## 2017-03-19 LAB — MAGNESIUM: Magnesium: 2 mg/dL (ref 1.7–2.4)

## 2017-03-19 LAB — PHOSPHORUS: Phosphorus: 3.1 mg/dL (ref 2.5–4.6)

## 2017-03-19 MED ORDER — SODIUM CHLORIDE 0.9 % IV SOLN: 100.0000 mL | INTRAVENOUS | Status: DC | PRN

## 2017-03-19 MED ORDER — ROPINIROLE HCL 0.5 MG PO TABS
0.5000 mg | ORAL_TABLET | Freq: Every day | ORAL | Status: DC
  Administered 2017-03-19 – 2017-03-21 (×3): 0.5 mg via ORAL
  Filled 2017-03-19 (×3): qty 1

## 2017-03-19 MED ORDER — AMLODIPINE BESYLATE 5 MG PO TABS
5.0000 mg | ORAL_TABLET | Freq: Every day | ORAL | Status: DC
  Administered 2017-03-19 – 2017-03-20 (×2): 5 mg via ORAL
  Filled 2017-03-19 (×2): qty 1

## 2017-03-19 MED ORDER — HYDRALAZINE HCL 20 MG/ML IJ SOLN
10.0000 mg | INTRAMUSCULAR | Status: DC | PRN
  Administered 2017-03-19 – 2017-03-21 (×2): 10 mg via INTRAVENOUS
  Filled 2017-03-19 (×2): qty 1

## 2017-03-19 MED ORDER — LIDOCAINE-PRILOCAINE 2.5-2.5 % EX CREA: 1.0000 "application " | TOPICAL_CREAM | CUTANEOUS | Status: DC | PRN

## 2017-03-19 MED ORDER — HEPARIN SODIUM (PORCINE) 1000 UNIT/ML DIALYSIS
5000.0000 [IU] | Freq: Once | INTRAMUSCULAR | Status: AC
  Administered 2017-03-19: 5000 [IU] via INTRAVENOUS_CENTRAL

## 2017-03-19 MED ORDER — PENTAFLUOROPROP-TETRAFLUOROETH EX AERO: 1.0000 "application " | INHALATION_SPRAY | CUTANEOUS | Status: DC | PRN

## 2017-03-19 MED ORDER — ALTEPLASE 2 MG IJ SOLR
2.0000 mg | Freq: Once | INTRAMUSCULAR | Status: DC | PRN
Start: 2017-03-19 — End: 2017-03-19

## 2017-03-19 MED ORDER — HEPARIN SODIUM (PORCINE) 1000 UNIT/ML DIALYSIS: 1000.0000 [IU] | INTRAMUSCULAR | Status: DC | PRN

## 2017-03-19 MED ORDER — MORPHINE SULFATE (PF) 2 MG/ML IV SOLN
1.0000 mg | Freq: Once | INTRAVENOUS | Status: AC
  Administered 2017-03-19: 1 mg via INTRAVENOUS
  Filled 2017-03-19: qty 1

## 2017-03-19 MED ORDER — LIDOCAINE HCL (PF) 1 % IJ SOLN: 5.0000 mL | INTRAMUSCULAR | Status: DC | PRN

## 2017-03-19 NOTE — Progress Notes (Signed)
Progress Note  Patient Name: LORRAINE TERRIQUEZ Date of Encounter: 03/19/2017  Primary Cardiologist: [facility]  Subjective   No chest pain or shortness of breath. Seen during dialysis.   Inpatient Medications    Scheduled Meds: . amLODipine  5 mg Oral Daily  . aspirin EC  81 mg Oral Daily  . atorvastatin  40 mg Oral q1800  . calcium acetate  2,001 mg Oral TID WC  . clopidogrel  75 mg Oral Q breakfast  . heparin  5,000 Units Subcutaneous Q8H  . insulin aspart  0-9 Units Subcutaneous Q4H  . lisinopril  20 mg Oral QHS  . metoprolol tartrate  12.5 mg Oral BID  . multivitamin  1 tablet Oral QHS  . pantoprazole  40 mg Oral QHS  . rOPINIRole  0.5 mg Oral QHS   Continuous Infusions: . sodium chloride    . sodium chloride    . ceFEPime (MAXIPIME) IV Stopped (03/18/17 1830)  . vancomycin Stopped (03/19/17 0820)   PRN Meds: sodium chloride, sodium chloride, acetaminophen, alteplase, heparin, hydrALAZINE, hydrALAZINE, lidocaine (PF), lidocaine-prilocaine, nitroGLYCERIN, ondansetron (ZOFRAN) IV, pentafluoroprop-tetrafluoroeth   Vital Signs    Vitals:   03/19/17 0825 03/19/17 0830 03/19/17 0900 03/19/17 0930  BP: (!) 202/96 (!) 184/109 (!) 168/91 (!) 144/76  Pulse: 94 80 93 97  Resp: (!) 21 (!) 22 (!) 24 (!) 22  Temp:      TempSrc:      SpO2:   100% 100%  Weight:      Height:        Intake/Output Summary (Last 24 hours) at 03/19/2017 0945 Last data filed at 03/19/2017 0302 Gross per 24 hour  Intake 1030 ml  Output -  Net 1030 ml   Filed Weights   03/17/17 2100 03/18/17 0006 03/19/17 0820  Weight: 162 lb 11.2 oz (73.8 kg) 155 lb 3.3 oz (70.4 kg) 159 lb 9.8 oz (72.4 kg)    Telemetry    Sinus rhythm at rate of 90-10s with PACs - Personally Reviewed  ECG    N/A  Physical Exam   GEN: No acute distress.   Neck: No JVD Cardiac: RRR, no murmurs, rubs, or gallops.  Respiratory: Course breath sound GI: Soft, nontender, non-distended  MS: No edema; No  deformity. Neuro:  Nonfocal  Psych: Normal affect   Labs    Chemistry Recent Labs  Lab 03/17/17 0944 03/17/17 1133 03/18/17 1523 03/19/17 0342  NA 134* 138 135 135  K 3.2* 3.1* 3.6 3.7  CL 94* 95* 95* 95*  CO2 29  --  31 28  GLUCOSE 106* 87 83 71  BUN 12 13 12 18   CREATININE 6.58* 6.30* 6.47* 7.57*  CALCIUM 10.5*  --  10.8* 11.3*  PROT  --   --  6.4*  --   ALBUMIN  --   --  3.2*  --   AST  --   --  15  --   ALT  --   --  10*  --   ALKPHOS  --   --  61  --   BILITOT  --   --  0.6  --   GFRNONAA 8*  --  8* 7*  GFRAA 9*  --  9* 8*  ANIONGAP 11  --  9 12     Hematology Recent Labs  Lab 03/17/17 0944 03/17/17 1133 03/18/17 1523 03/19/17 0342  WBC 9.1  --  5.5 7.3  RBC 4.28  --  4.11* 4.14*  HGB 12.6*  15.0 12.2* 12.0*  HCT 39.8 44.0 38.5* 38.8*  MCV 93.0  --  93.7 93.7  MCH 29.4  --  29.7 29.0  MCHC 31.7  --  31.7 30.9  RDW 16.3*  --  16.3* 16.3*  PLT 208  --  185 205    Cardiac Enzymes Recent Labs  Lab 03/17/17 1523 03/17/17 1815 03/17/17 2116  TROPONINI 0.06* 0.07* 0.07*    Recent Labs  Lab 03/17/17 0942  TROPIPOC 0.04     BNP Recent Labs  Lab 03/17/17 1124  BNP 3,394.7*     DDimer No results for input(s): DDIMER in the last 168 hours.   Radiology    Dg Chest 2 View  Result Date: 03/19/2017 CLINICAL DATA:  66 year old male with a history of shortness of breath and weakness EXAM: CHEST  2 VIEW COMPARISON:  03/18/2017, 03/17/2017 FINDINGS: Cardiomediastinal silhouette unchanged in size and contour. Calcifications of the aortic arch. Coarsened interstitial markings, similar to prior. No significant interlobular septal thickening. Lateral view demonstrates meniscus, corresponding to opacity at the right lung base and blunting of the left costophrenic angle. IMPRESSION: Overall, aeration is improved with small bilateral pleural effusions and associated atelectasis/ consolidation. Coarsened interstitial markings may reflect mild pulmonary edema and/  or atypical infection. Electronically Signed   By: Corrie Mckusick D.O.   On: 03/19/2017 08:16   Dg Chest 2 View  Result Date: 03/17/2017 CLINICAL DATA:  Nausea vomiting earlier today. EXAM: CHEST  2 VIEW COMPARISON:  11/29/2016 FINDINGS: Cardiac silhouette is normal in size. No mediastinal or hilar masses. No evidence of adenopathy. Mild interstitial thickening is noted most evident in the lower lungs, including thickening of the fissures. No lung consolidation to suggest pneumonia. No pleural effusion. No pneumothorax. Skeletal structures are intact. IMPRESSION: 1. Mild interstitial thickening, which appears mildly increased from prior exams. If there shortness of breath, consider mild interstitial pulmonary edema. 2. No convincing pneumonia. Electronically Signed   By: Lajean Manes M.D.   On: 03/17/2017 10:00   Ct Head Wo Contrast  Result Date: 03/18/2017 CLINICAL DATA:  Altered mental status with lethargy EXAM: CT HEAD WITHOUT CONTRAST TECHNIQUE: Contiguous axial images were obtained from the base of the skull through the vertex without intravenous contrast. COMPARISON:  None. FINDINGS: Brain: There is mild diffuse atrophy. There is no intracranial mass, hemorrhage, extra-axial fluid collection, or midline shift. There is patchy small vessel disease in the centra semiovale bilaterally. Elsewhere gray-white compartments appear normal. No acute infarct is demonstrable. Vascular: There is no appreciable hyperdense vessel. There is calcification in each carotid siphon region. There is also calcification in each distal vertebral artery. Skull: The bony calvarium appears intact. Sinuses/Orbits: There is mucosal thickening in several ethmoid air cells bilaterally. Visualized paranasal sinuses otherwise clear. Orbits appear symmetric bilaterally. Other: Mastoid air cells are clear. IMPRESSION: Atrophy with periventricular small vessel disease. No intracranial mass, hemorrhage, or evidence of acute infarct. There  are foci of arterial vascular calcification. There is mucosal thickening in several ethmoid air cells bilaterally. Electronically Signed   By: Lowella Grip III M.D.   On: 03/18/2017 14:45   Dg Chest Port 1 View  Result Date: 03/18/2017 CLINICAL DATA:  Shortness of Breath EXAM: PORTABLE CHEST 1 VIEW COMPARISON:  March 17, 2017 FINDINGS: There is airspace consolidation in the left lower lobe with small left pleural effusion. There is atelectatic change in the right base. Lungs elsewhere clear. Heart is upper normal in size with pulmonary vascularity within normal limits. No adenopathy. There  is aortic atherosclerosis. No evident bone lesions. IMPRESSION: Airspace consolidation left lower lobe with small left pleural effusion. Airspace consolidation left base has increased significantly compared to 1 day prior. Patchy atelectasis remains right base. Stable cardiac silhouette. There is aortic atherosclerosis. Aortic Atherosclerosis (ICD10-I70.0). Electronically Signed   By: Lowella Grip III M.D.   On: 03/18/2017 13:57    Cardiac Studies   Pending reading of echo  Patient Profile     66 y.o. male with hx of CAD s/p CABG LIMA to LAD at [facility] 01/2017, ESRD on HD, HTN, HLD, DM, chronic diastolic CHF and anemia presented with chest pain and shortness of breath. Found to be hypertensive and acute CHF.   Assessment & Plan    1. Chest pain with hx CAD s/p recent CABG - Flat trend. Demand in setting of acute illness. Continue ASA, Plavix, statin and BB. No recurrent chest pain.   2. Acute on chronic diastolic CHF - CXR with pulmonary edema in setting of HTN. Pending reading of echo. Volume managed by dialysis.   3. HTN - BP improving. Up titrate medication as needed. HR in 90-100s, will increase BB --> pending echo.   4. HLD - Continue statin.   5. ESRD on HD  For questions or updates, please contact [place] Please consult [email or web address] for contact info under Cardiology/STEMI.       Signed, Leanor Kail, PA  03/19/2017, 9:45 AM    History and all data above reviewed.  Patient examined.  I agree with the findings as above.  He denies ay chest pain.  The patient exam reveals COR:RRR  ,  Lungs: Clear  ,  Abd: Positive bowel sounds, no rebound no guarding, Ext No edema  .  All available labs, radiology testing, previous records reviewed. Agree with documented assessment and plan. Chest pain:  This is atypical. Enzymes are non diagnostic.  No further work up other than echo which is pending.     [name] Jaleigha Deane  10:59 AM  03/19/2017

## 2017-03-19 NOTE — Progress Notes (Signed)
Pt in HD- will check on pt next day Kari Baars, [place] [phone]

## 2017-03-19 NOTE — Progress Notes (Signed)
[facility] Progress Note   Dialysis Orders: [facility] MWF 4h 75.5kg 2K/2Ca Profile 2 L AVF Heparin 5000 Venofer 50mg  IV q week  Calcitriol 2.24mcg PO q HD  Assessment/Plan: 1. Dyspnea/pulm edema - had HD yest with 3 L off, breathing is better. Is under dry wt by 5kg, losing body wt.  Lowering EDW. Pre HD bed wt - goal 3.5 today - asked staff to do standing wt post HD 2. Lethargy 12/9 - getting numerous sedating meds, have d/w primary 12/9 - more alert today 3. Chest pain/CAD/s/p CABG 01/2017 - per primary 4. ESRD - usual HD is MWF- HD in progress K 3.7 - 4 K bath - low  K obviates use of 2 Ca bath 5. HTN - BP's still up on norvasc/ acei/ BB- continue to lower edw 6. Anemia - Hgb 12 No ESA needs currently 7. Metabolic bone disease -Ca 11.3  Hold Vit D/Ca acetate with Jamas Lav,. Readdress a outpatient HD unit after d/c - may need sensipar; changed to non Ca based binder; titrate up based on P 8. Nutrition -Renal diet/vitamins  Myriam Jacobson, PA-C [facility] Beeper [phone] 03/19/2017,9:11 AM  LOS: 2 days   Pt seen, examined and agree w A/P as above.  Kelly Splinter MD [facility] pager [phone]   03/19/2017, 1:40 PM    Subjective:   No c/o - doesn't use O2 at home.  Objective Vitals:   03/19/17 0500 03/19/17 0549 03/19/17 0641 03/19/17 0820  BP:  (!) 175/95 (!) 194/107 (!) 179/96  Pulse:  85 96 89  Resp:    (!) 21  Temp: 97.9 F (36.6 C)   97.9 F (36.6 C)  TempSrc: Oral   Oral  SpO2:  100% 99% 99%  Weight:    72.4 kg (159 lb 9.8 oz)  Height:       Physical Exam General: NAD on HD Heart: RRR with ectopy-  Lungs: no rales or wheezes Abdomen: soft NT ND Extremities: no LE edema Dialysis Access:  Left AVF + bruit   Additional Objective Labs: Basic Metabolic Panel: Recent Labs  Lab 03/17/17 0944 03/17/17 1133 03/18/17 1523 03/19/17 0342  NA 134* 138 135 135  K 3.2* 3.1* 3.6 3.7  CL 94* 95* 95*  95*  CO2 29  --  31 28  GLUCOSE 106* 87 83 71  BUN 12 13 12 18   CREATININE 6.58* 6.30* 6.47* 7.57*  CALCIUM 10.5*  --  10.8* 11.3*   Liver Function Tests: Recent Labs  Lab 03/18/17 1523  AST 15  ALT 10*  ALKPHOS 61  BILITOT 0.6  PROT 6.4*  ALBUMIN 3.2*   CBC: Recent Labs  Lab 03/17/17 0944 03/17/17 1133 03/18/17 1523 03/19/17 0342  WBC 9.1  --  5.5 7.3  NEUTROABS  --   --  2.0 3.0  HGB 12.6* 15.0 12.2* 12.0*  HCT 39.8 44.0 38.5* 38.8*  MCV 93.0  --  93.7 93.7  PLT 208  --  185 205    Cardiac Enzymes: Recent Labs  Lab 03/17/17 1523 03/17/17 1815 03/17/17 2116  TROPONINI 0.06* 0.07* 0.07*   CBG: Recent Labs  Lab 03/18/17 1115 03/18/17 1629 03/18/17 2108 03/18/17 2335 03/19/17 0435  GLUCAP 110* 78 96 84 72   Iron Studies: No results for input(s): IRON, TIBC, TRANSFERRIN, FERRITIN in the last 72 hours. Lab Results  Component Value Date   INR 1.01 06/22/2014   INR 1.09 03/25/2014   INR 1.01 12/05/2013   Studies/Results:  Dg Chest 2 View  Result Date: 03/19/2017 CLINICAL DATA:  66 year old male with a history of shortness of breath and weakness EXAM: CHEST  2 VIEW COMPARISON:  03/18/2017, 03/17/2017 FINDINGS: Cardiomediastinal silhouette unchanged in size and contour. Calcifications of the aortic arch. Coarsened interstitial markings, similar to prior. No significant interlobular septal thickening. Lateral view demonstrates meniscus, corresponding to opacity at the right lung base and blunting of the left costophrenic angle. IMPRESSION: Overall, aeration is improved with small bilateral pleural effusions and associated atelectasis/ consolidation. Coarsened interstitial markings may reflect mild pulmonary edema and/ or atypical infection. Electronically Signed   By: Corrie Mckusick D.O.   On: 03/19/2017 08:16   Dg Chest 2 View  Result Date: 03/17/2017 CLINICAL DATA:  Nausea vomiting earlier today. EXAM: CHEST  2 VIEW COMPARISON:  11/29/2016 FINDINGS: Cardiac  silhouette is normal in size. No mediastinal or hilar masses. No evidence of adenopathy. Mild interstitial thickening is noted most evident in the lower lungs, including thickening of the fissures. No lung consolidation to suggest pneumonia. No pleural effusion. No pneumothorax. Skeletal structures are intact. IMPRESSION: 1. Mild interstitial thickening, which appears mildly increased from prior exams. If there shortness of breath, consider mild interstitial pulmonary edema. 2. No convincing pneumonia. Electronically Signed   By: Lajean Manes M.D.   On: 03/17/2017 10:00   Ct Head Wo Contrast  Result Date: 03/18/2017 CLINICAL DATA:  Altered mental status with lethargy EXAM: CT HEAD WITHOUT CONTRAST TECHNIQUE: Contiguous axial images were obtained from the base of the skull through the vertex without intravenous contrast. COMPARISON:  None. FINDINGS: Brain: There is mild diffuse atrophy. There is no intracranial mass, hemorrhage, extra-axial fluid collection, or midline shift. There is patchy small vessel disease in the centra semiovale bilaterally. Elsewhere gray-white compartments appear normal. No acute infarct is demonstrable. Vascular: There is no appreciable hyperdense vessel. There is calcification in each carotid siphon region. There is also calcification in each distal vertebral artery. Skull: The bony calvarium appears intact. Sinuses/Orbits: There is mucosal thickening in several ethmoid air cells bilaterally. Visualized paranasal sinuses otherwise clear. Orbits appear symmetric bilaterally. Other: Mastoid air cells are clear. IMPRESSION: Atrophy with periventricular small vessel disease. No intracranial mass, hemorrhage, or evidence of acute infarct. There are foci of arterial vascular calcification. There is mucosal thickening in several ethmoid air cells bilaterally. Electronically Signed   By: Lowella Grip III M.D.   On: 03/18/2017 14:45   Dg Chest Port 1 View  Result Date:  03/18/2017 CLINICAL DATA:  Shortness of Breath EXAM: PORTABLE CHEST 1 VIEW COMPARISON:  March 17, 2017 FINDINGS: There is airspace consolidation in the left lower lobe with small left pleural effusion. There is atelectatic change in the right base. Lungs elsewhere clear. Heart is upper normal in size with pulmonary vascularity within normal limits. No adenopathy. There is aortic atherosclerosis. No evident bone lesions. IMPRESSION: Airspace consolidation left lower lobe with small left pleural effusion. Airspace consolidation left base has increased significantly compared to 1 day prior. Patchy atelectasis remains right base. Stable cardiac silhouette. There is aortic atherosclerosis. Aortic Atherosclerosis (ICD10-I70.0). Electronically Signed   By: Lowella Grip III M.D.   On: 03/18/2017 13:57   Medications: . ceFEPime (MAXIPIME) IV Stopped (03/18/17 1830)  . vancomycin Stopped (03/19/17 0820)   . amLODipine  2.5 mg Oral Daily  . aspirin EC  81 mg Oral Daily  . atorvastatin  40 mg Oral q1800  . calcium acetate  2,001 mg Oral TID WC  . clopidogrel  75 mg Oral Q breakfast  . heparin  5,000 Units Subcutaneous Q8H  . insulin aspart  0-9 Units Subcutaneous Q4H  . lisinopril  20 mg Oral QHS  . metoprolol tartrate  12.5 mg Oral BID  . multivitamin  1 tablet Oral QHS  . pantoprazole  40 mg Oral QHS

## 2017-03-19 NOTE — Evaluation (Signed)
Physical Therapy Evaluation Patient Details Name: Jon Anderson MRN: [id number] DOB: 21-Jul-1950 Today's Date: 03/19/2017   History of Present Illness  pt is a 66 y/o male with pmh significant for CKD on HD, Chronic diastolic HF, recent CABGx1, HTN, DM, presnting to the ED with c/o central to left-sided chest pain.  Work up incl volume overload, probable HAP.  Clinical Impression  Pt admitted with/for CP, workup showing volume overload and probable HAP.  Pt needing min assist at this time, overall..  Pt currently limited functionally due to the problems listed below.  (see problems list.)  Pt will benefit from PT to maximize function and safety to be able to get home safely with available assist .     Follow Up Recommendations Home health PT;Other (comment);Supervision/Assistance - 24 hour(assuming his wife is able to assist)    Equipment Recommendations  None recommended by PT    Recommendations for Other Services       Precautions / Restrictions Precautions Precautions: Fall      Mobility  Bed Mobility Overal bed mobility: Needs Assistance Bed Mobility: Supine to Sit;Sit to Supine     Supine to sit: HOB elevated;Min assist Sit to supine: Min guard      Transfers Overall transfer level: Needs assistance   Transfers: Sit to/from Stand Sit to Stand: Min guard         General transfer comment: used safe transfer technique with cane.  no assist needed  Ambulation/Gait Ambulation/Gait assistance: Min guard Ambulation Distance (Feet): 60 Feet Assistive device: Straight cane Gait Pattern/deviations: Decreased stance time - right;Step-through pattern;Decreased stride length   Gait velocity interpretation: Below normal speed for age/gender General Gait Details: stable, but antalgic gait on the R with pt using the cane on the right also.  Stairs            Wheelchair Mobility    Modified Rankin (Stroke Patients Only)       Balance Overall balance  assessment: Needs assistance   Sitting balance-Leahy Scale: Good       Standing balance-Leahy Scale: Fair Standing balance comment: prefers cane to help balance due to R LE pain.                             Pertinent Vitals/Pain Pain Assessment: Faces Faces Pain Scale: Hurts even more Pain Location: R LE Pain Descriptors / Indicators: Aching;Grimacing;Guarding Pain Intervention(s): Monitored during session;Repositioned;Limited activity within patient's tolerance    Home Living Family/patient expects to be discharged to:: Private residence Living Arrangements: Spouse/significant other;Children Available Help at Discharge: Family;Friend(s) Type of Home: House Home Access: Stairs to enter     Home Layout: One level        Prior Function Level of Independence: Independent with assistive device(s)         Comments: Doesn't get out much, but is independent with cane in the home.  Independent with ADL's     Hand Dominance        Extremity/Trunk Assessment   Upper Extremity Assessment Upper Extremity Assessment: Defer to OT evaluation    Lower Extremity Assessment Lower Extremity Assessment: Overall WFL for tasks assessed;RLE deficits/detail RLE Deficits / Details: generalized weakness from radicular back pain    Cervical / Trunk Assessment Cervical / Trunk Assessment: Kyphotic  Communication   Communication: No difficulties  Cognition Arousal/Alertness: Awake/alert Behavior During Therapy: WFL for tasks assessed/performed Overall Cognitive Status: Within Functional Limits for tasks assessed  General Comments General comments (skin integrity, edema, etc.): Sats on 2L at 98%:  On RA at rest sats drop to 92/93%.  During gait sats dropped to 88/89%, EHR in the upper 90's    Exercises     Assessment/Plan    PT Assessment Patient needs continued PT services  PT Problem List Decreased  strength;Decreased activity tolerance;Decreased mobility       PT Treatment Interventions DME instruction;Gait training;Functional mobility training;Stair training;Therapeutic activities;Balance training;Patient/family education    PT Goals (Current goals can be found in the Care Plan section)  Acute Rehab PT Goals Patient Stated Goal: able to get to my appointment on the 21st PT Goal Formulation: With patient Time For Goal Achievement: 04/02/17 Potential to Achieve Goals: Good    Frequency Min 3X/week   Barriers to discharge        Co-evaluation               AM-PAC PT "6 Clicks" Daily Activity  Outcome Measure Difficulty turning over in bed (including adjusting bedclothes, sheets and blankets)?: A Little Difficulty moving from lying on back to sitting on the side of the bed? : A Little Difficulty sitting down on and standing up from a chair with arms (e.g., wheelchair, bedside commode, etc,.)?: A Little Help needed moving to and from a bed to chair (including a wheelchair)?: A Little Help needed walking in hospital room?: A Little Help needed climbing 3-5 steps with a railing? : A Lot 6 Click Score: 17    End of Session Equipment Utilized During Treatment: Oxygen Activity Tolerance: Patient tolerated treatment well Patient left: in bed;with call bell/phone within reach Nurse Communication: Mobility status PT Visit Diagnosis: Unsteadiness on feet (R26.81);Other abnormalities of gait and mobility (R26.89);Pain Pain - Right/Left: Right Pain - part of body: Hip;Leg    Time: 3235-5732 PT Time Calculation (min) (ACUTE ONLY): 25 min   Charges:   PT Evaluation $PT Eval Moderate Complexity: 1 Mod PT Treatments $Gait Training: 8-22 mins   PT G Codes:        28-Mar-2017  Donnella Sham, PT [phone]  (pager)  Tessie Fass Dawna Jakes 03/28/17, 4:59 PM

## 2017-03-19 NOTE — Progress Notes (Signed)
Patient ID: Jon Anderson, male   DOB: 03/26/1951, 66 y.o.   MRN: [id number]  PROGRESS NOTE    DANIELA HERNAN  CHE:[id number] DOB: 1950-08-07 DOA: 03/17/2017 PCP: [name], MD   Brief Narrative:  66 year old male with history of end-stage renal disease on dialysis, chronic diastolic heart failure, coronary artery disease status post recent CABG in October 2018 at [facility], hypertension, anemia of chronic disease, dyslipidemia and diabetes presented with chest pain and shortness of breath.  He was found to be hypertensive and was admitted with heart failure.  Nephrology and cardiology were consulted.   Assessment & Plan:   Principal Problem:   Chest pain Active Problems:   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/[facility]   Anemia of chronic disease   Severe uncontrolled hypertension   ESRD (end stage renal disease) on dialysis (HCC)   Hyperlipidemia   Chronic chest pain   Type II diabetes mellitus ([place])   Acute on chronic diastolic heart failure ([place])   Acute heart failure (HCC)  Probable acute metabolic/toxic encephalopathy causing lethargy in a patient on multiple sedative medications -Patient was very drowsy on 03/18/2017.  Most of his sedative medications and pain medications including Requip were held -Patient is more awake today.  Continue monitoring  mental status -CT of the brain on 03/18/2017 was unremarkable -We will restart Requip today as patient is concerned about his restless leg syndrome  Probable healthcare associated pneumonia, evolving on admission -With concern for gram-negative rod and MRSA -Currently on cefepime and vancomycin.  Follow cultures  Chest pain -Troponin minimally elevated but flat.  Cardiology following.  Probably secondary to elevated BP and heart failure -Echo pending  Acute on chronic diastolic heart failure -Cardiology following.  Echo pending.  Fluid being managed by dialysis.  Continue lisinopril, metoprolol -Daily weights.  Strict input and  output  End-stage renal disease on home dialysis -Nephrology following.  Coronary artery disease status post recent CABG -Continue aspirin, Plavix, statin, metoprolol, lisinopril, amlodipine  Hypertension -Monitor blood pressure.  Blood pressure on the higher side.  Continue metoprolol, lisinopril.  Increase amlodipine to 5 mg daily  Dyslipidemia -Continue Lipitor  Diabetes mellitus type 2 -Apparently he is off insulin pump recently.  Continue Accu-Cheks with coverage  Anemia of chronic disease from renal failure -Hemoglobin stable  Generalized deconditioning -PT/OT eval   DVT prophylaxis: Subcutaneous heparin Code Status: Full Family Communication: None at bedside Disposition Plan: Probable home in 1-2 days  Consultants: Cardiology and nephrology  Procedures: Echo pending  Antimicrobials: None   Subjective: Patient seen and examined at bedside going dialysis.  He is more awake today and answers questions.  He denies current chest pain.  No overnight fever or vomiting.  He is worried about his restless leg syndrome  Objective: Vitals:   03/19/17 0820 03/19/17 0825 03/19/17 0830 03/19/17 0900  BP: (!) 179/96 (!) 202/96 (!) 184/109 (!) 168/91  Pulse: 89 94 80 93  Resp: (!) 21 (!) 21 (!) 22 (!) 24  Temp: 97.9 F (36.6 C)     TempSrc: Oral     SpO2: 99%   100%  Weight: 72.4 kg (159 lb 9.8 oz)     Height:        Intake/Output Summary (Last 24 hours) at 03/19/2017 0933 Last data filed at 03/19/2017 0302 Gross per 24 hour  Intake 1030 ml  Output -  Net 1030 ml   Filed Weights   03/17/17 2100 03/18/17 0006 03/19/17 0820  Weight: 73.8 kg (162 lb 11.2  oz) 70.4 kg (155 lb 3.3 oz) 72.4 kg (159 lb 9.8 oz)    Examination:  General exam: Appears calm and comfortable.  More awake today and answers questions Respiratory system: Bilateral decreased breath sound at bases with some scattered crackles; intermittent tachypnea Cardiovascular system: S1 & S2 heard, rate  controlled  gastrointestinal system: Abdomen is nondistended, soft and nontender. Normal bowel sounds heard. Extremities: No cyanosis, clubbing; trace edema   Data Reviewed: I have personally reviewed following labs and imaging studies  CBC: Recent Labs  Lab 03/17/17 0944 03/17/17 1133 03/18/17 1523 03/19/17 0342  WBC 9.1  --  5.5 7.3  NEUTROABS  --   --  2.0 3.0  HGB 12.6* 15.0 12.2* 12.0*  HCT 39.8 44.0 38.5* 38.8*  MCV 93.0  --  93.7 93.7  PLT 208  --  185 850   Basic Metabolic Panel: Recent Labs  Lab 03/17/17 0944 03/17/17 1133 03/18/17 1523 03/19/17 0342  NA 134* 138 135 135  K 3.2* 3.1* 3.6 3.7  CL 94* 95* 95* 95*  CO2 29  --  31 28  GLUCOSE 106* 87 83 71  BUN 12 13 12 18   CREATININE 6.58* 6.30* 6.47* 7.57*  CALCIUM 10.5*  --  10.8* 11.3*  MG  --   --  1.9 2.0   GFR: Estimated Creatinine Clearance: 9.7 mL/min (A) (by C-G formula based on SCr of 7.57 mg/dL (H)). Liver Function Tests: Recent Labs  Lab 03/18/17 1523  AST 15  ALT 10*  ALKPHOS 61  BILITOT 0.6  PROT 6.4*  ALBUMIN 3.2*   No results for input(s): LIPASE, AMYLASE in the last 168 hours. No results for input(s): AMMONIA in the last 168 hours. Coagulation Profile: No results for input(s): INR, PROTIME in the last 168 hours. Cardiac Enzymes: Recent Labs  Lab 03/17/17 1523 03/17/17 1815 03/17/17 2116  TROPONINI 0.06* 0.07* 0.07*   BNP (last 3 results) No results for input(s): PROBNP in the last 8760 hours. HbA1C: Recent Labs    03/17/17 1523  HGBA1C 4.8   CBG: Recent Labs  Lab 03/18/17 1115 03/18/17 1629 03/18/17 2108 03/18/17 2335 03/19/17 0435  GLUCAP 110* 78 96 84 72   Lipid Profile: No results for input(s): CHOL, HDL, LDLCALC, TRIG, CHOLHDL, LDLDIRECT in the last 72 hours. Thyroid Function Tests: No results for input(s): TSH, T4TOTAL, FREET4, T3FREE, THYROIDAB in the last 72 hours. Anemia Panel: No results for input(s): VITAMINB12, FOLATE, FERRITIN, TIBC, IRON,  RETICCTPCT in the last 72 hours. Sepsis Labs: No results for input(s): PROCALCITON, LATICACIDVEN in the last 168 hours.  Recent Results (from the past 240 hour(s))  MRSA PCR Screening     Status: None   Collection Time: 03/18/17  5:00 PM  Result Value Ref Range Status   MRSA by PCR NEGATIVE NEGATIVE Final    Comment:        The GeneXpert MRSA Assay (FDA approved for NASAL specimens only), is one component of a comprehensive MRSA colonization surveillance program. It is not intended to diagnose MRSA infection nor to guide or monitor treatment for MRSA infections.          Radiology Studies: Dg Chest 2 View  Result Date: 03/19/2017 CLINICAL DATA:  66 year old male with a history of shortness of breath and weakness EXAM: CHEST  2 VIEW COMPARISON:  03/18/2017, 03/17/2017 FINDINGS: Cardiomediastinal silhouette unchanged in size and contour. Calcifications of the aortic arch. Coarsened interstitial markings, similar to prior. No significant interlobular septal thickening. Lateral view demonstrates meniscus,  corresponding to opacity at the right lung base and blunting of the left costophrenic angle. IMPRESSION: Overall, aeration is improved with small bilateral pleural effusions and associated atelectasis/ consolidation. Coarsened interstitial markings may reflect mild pulmonary edema and/ or atypical infection. Electronically Signed   By: Corrie Mckusick D.O.   On: 03/19/2017 08:16   Dg Chest 2 View  Result Date: 03/17/2017 CLINICAL DATA:  Nausea vomiting earlier today. EXAM: CHEST  2 VIEW COMPARISON:  11/29/2016 FINDINGS: Cardiac silhouette is normal in size. No mediastinal or hilar masses. No evidence of adenopathy. Mild interstitial thickening is noted most evident in the lower lungs, including thickening of the fissures. No lung consolidation to suggest pneumonia. No pleural effusion. No pneumothorax. Skeletal structures are intact. IMPRESSION: 1. Mild interstitial thickening, which  appears mildly increased from prior exams. If there shortness of breath, consider mild interstitial pulmonary edema. 2. No convincing pneumonia. Electronically Signed   By: Lajean Manes M.D.   On: 03/17/2017 10:00   Ct Head Wo Contrast  Result Date: 03/18/2017 CLINICAL DATA:  Altered mental status with lethargy EXAM: CT HEAD WITHOUT CONTRAST TECHNIQUE: Contiguous axial images were obtained from the base of the skull through the vertex without intravenous contrast. COMPARISON:  None. FINDINGS: Brain: There is mild diffuse atrophy. There is no intracranial mass, hemorrhage, extra-axial fluid collection, or midline shift. There is patchy small vessel disease in the centra semiovale bilaterally. Elsewhere gray-white compartments appear normal. No acute infarct is demonstrable. Vascular: There is no appreciable hyperdense vessel. There is calcification in each carotid siphon region. There is also calcification in each distal vertebral artery. Skull: The bony calvarium appears intact. Sinuses/Orbits: There is mucosal thickening in several ethmoid air cells bilaterally. Visualized paranasal sinuses otherwise clear. Orbits appear symmetric bilaterally. Other: Mastoid air cells are clear. IMPRESSION: Atrophy with periventricular small vessel disease. No intracranial mass, hemorrhage, or evidence of acute infarct. There are foci of arterial vascular calcification. There is mucosal thickening in several ethmoid air cells bilaterally. Electronically Signed   By: Lowella Grip III M.D.   On: 03/18/2017 14:45   Dg Chest Port 1 View  Result Date: 03/18/2017 CLINICAL DATA:  Shortness of Breath EXAM: PORTABLE CHEST 1 VIEW COMPARISON:  March 17, 2017 FINDINGS: There is airspace consolidation in the left lower lobe with small left pleural effusion. There is atelectatic change in the right base. Lungs elsewhere clear. Heart is upper normal in size with pulmonary vascularity within normal limits. No adenopathy. There is  aortic atherosclerosis. No evident bone lesions. IMPRESSION: Airspace consolidation left lower lobe with small left pleural effusion. Airspace consolidation left base has increased significantly compared to 1 day prior. Patchy atelectasis remains right base. Stable cardiac silhouette. There is aortic atherosclerosis. Aortic Atherosclerosis (ICD10-I70.0). Electronically Signed   By: Lowella Grip III M.D.   On: 03/18/2017 13:57        Scheduled Meds: . amLODipine  2.5 mg Oral Daily  . aspirin EC  81 mg Oral Daily  . atorvastatin  40 mg Oral q1800  . calcium acetate  2,001 mg Oral TID WC  . clopidogrel  75 mg Oral Q breakfast  . heparin  5,000 Units Subcutaneous Q8H  . insulin aspart  0-9 Units Subcutaneous Q4H  . lisinopril  20 mg Oral QHS  . metoprolol tartrate  12.5 mg Oral BID  . multivitamin  1 tablet Oral QHS  . pantoprazole  40 mg Oral QHS   Continuous Infusions: . sodium chloride    . sodium chloride    .  ceFEPime (MAXIPIME) IV Stopped (03/18/17 1830)  . vancomycin Stopped (03/19/17 0820)     LOS: 2 days        Aline August, MD [facility] Pager [phone]  If 7PM-7AM, please contact night-coverage [email or web address] Password [facility] 03/19/2017, 9:33 AM

## 2017-03-19 NOTE — Progress Notes (Signed)
SATURATION QUALIFICATIONS: (This note is used to comply with regulatory documentation for home oxygen)  Patient Saturations on Room Air at Rest = 96%  Patient Saturations on Room Air while Ambulating = 89%  Patient  Ambulated about 45 feet Became very SOB however O2 Sats did not drop below 89%

## 2017-03-20 LAB — ECHOCARDIOGRAM COMPLETE
FS: 30 % (ref 28–44)
Height: 71 in
IVS/LV PW RATIO, ED: 0.92
LA ID, A-P, ES: 48 mm
LA diam end sys: 48 mm
LA diam index: 2.54 cm/m2
LA vol A4C: 92.1 ml
LA vol index: 51.2 mL/m2
LA vol: 96.8 mL
LV PW d: 12 mm — AB (ref 0.6–1.1)
LVOT MV VTI INDEX: 0.68 cm2/m2
LVOT MV VTI: 1.29
LVOT SV: 69 mL
LVOT VTI: 21.9 cm
LVOT area: 3.14 cm2
LVOT diameter: 20 mm
LVOT peak grad rest: 5 mmHg
LVOT peak vel: 116 cm/s
MV Annulus VTI: 53.3 cm
MV M vel: 104
Mean grad: 5 mmHg
Weight: 2483.26 oz

## 2017-03-20 LAB — GLUCOSE, CAPILLARY
Glucose-Capillary: 102 mg/dL — ABNORMAL HIGH (ref 65–99)
Glucose-Capillary: 103 mg/dL — ABNORMAL HIGH (ref 65–99)
Glucose-Capillary: 118 mg/dL — ABNORMAL HIGH (ref 65–99)
Glucose-Capillary: 124 mg/dL — ABNORMAL HIGH (ref 65–99)
Glucose-Capillary: 67 mg/dL (ref 65–99)
Glucose-Capillary: 87 mg/dL (ref 65–99)
Glucose-Capillary: 98 mg/dL (ref 65–99)

## 2017-03-20 LAB — LEGIONELLA PNEUMOPHILA SEROGP 1 UR AG: L. pneumophila Serogp 1 Ur Ag: NEGATIVE

## 2017-03-20 LAB — BASIC METABOLIC PANEL
Anion gap: 10 (ref 5–15)
BUN: 17 mg/dL (ref 6–20)
CO2: 27 mmol/L (ref 22–32)
Calcium: 10.3 mg/dL (ref 8.9–10.3)
Chloride: 97 mmol/L — ABNORMAL LOW (ref 101–111)
Creatinine, Ser: 7.03 mg/dL — ABNORMAL HIGH (ref 0.61–1.24)
GFR calc Af Amer: 8 mL/min — ABNORMAL LOW (ref >60)
GFR calc non Af Amer: 7 mL/min — ABNORMAL LOW (ref >60)
Glucose, Bld: 93 mg/dL (ref 65–99)
Potassium: 4 mmol/L (ref 3.5–5.1)
Sodium: 134 mmol/L — ABNORMAL LOW (ref 135–145)

## 2017-03-20 LAB — CBC
HCT: 41 % (ref 39.0–52.0)
Hemoglobin: 13.1 g/dL (ref 13.0–17.0)
MCH: 29.4 pg (ref 26.0–34.0)
MCHC: 32 g/dL (ref 30.0–36.0)
MCV: 92.1 fL (ref 78.0–100.0)
Platelets: 201 10*3/uL (ref 150–400)
RBC: 4.45 MIL/uL (ref 4.22–5.81)
RDW: 15.8 % — ABNORMAL HIGH (ref 11.5–15.5)
WBC: 7.5 10*3/uL (ref 4.0–10.5)

## 2017-03-20 MED ORDER — LEVOFLOXACIN 500 MG PO TABS
500.0000 mg | ORAL_TABLET | ORAL | Status: DC
  Administered 2017-03-20: 500 mg via ORAL
  Filled 2017-03-20: qty 1

## 2017-03-20 MED ORDER — SENNA 8.6 MG PO TABS
1.0000 | ORAL_TABLET | Freq: Every day | ORAL | Status: DC | PRN
  Administered 2017-03-20: 8.6 mg via ORAL
  Filled 2017-03-20: qty 1

## 2017-03-20 MED ORDER — AMLODIPINE BESYLATE 5 MG PO TABS
5.0000 mg | ORAL_TABLET | Freq: Every day | ORAL | Status: DC
  Administered 2017-03-20 – 2017-03-21 (×2): 5 mg via ORAL
  Filled 2017-03-20 (×2): qty 1

## 2017-03-20 MED ORDER — POLYETHYLENE GLYCOL 3350 17 G PO PACK
17.0000 g | PACK | Freq: Every day | ORAL | Status: DC
  Administered 2017-03-20 – 2017-03-22 (×3): 17 g via ORAL
  Filled 2017-03-20 (×3): qty 1

## 2017-03-20 MED ORDER — LIDOCAINE 5 % EX PTCH
1.0000 | MEDICATED_PATCH | CUTANEOUS | Status: DC
  Administered 2017-03-20 – 2017-03-21 (×2): 1 via TRANSDERMAL
  Filled 2017-03-20 (×3): qty 1

## 2017-03-20 MED ORDER — METOPROLOL TARTRATE 25 MG PO TABS
25.0000 mg | ORAL_TABLET | Freq: Two times a day (BID) | ORAL | Status: DC
  Administered 2017-03-20 – 2017-03-22 (×4): 25 mg via ORAL
  Filled 2017-03-20 (×4): qty 1

## 2017-03-20 NOTE — Care Management Note (Addendum)
Case Management Note  Patient Details  Name: Jon Anderson MRN: [id number] Date of Birth: 21-Feb-1951  Subjective/Objective:    HCAP, CHF, ESRD -HD                Action/Plan: Discharge Planning: Spoke to pt and lives at home with wife and dtr. Dtr transports him to his dialysis treatments. Pt has cane and RW at home. Will continue to follow for dc needs. Offered choice for HH/list provided. Pt states he had HH in the past and not sure what agency. Contacted [facility] and [facility]. Pt had [facility] in the past. Pt agreeable to [facility] for [facility].   PCP [name]   Expected Discharge Date:  03/22/2017              Expected Discharge Plan:  Home/Self Care  In-House Referral:  NA  Discharge planning Services  CM Consult  Post Acute Care Choice:  NA Choice offered to:  NA  DME Arranged:  N/A DME Agency:  NA  HH Arranged:  PT HH Agency:  [facility]   Status of Service: complete  If discussed at [place] of Stay Meetings, dates discussed:    Additional Comments:  Erenest Rasher, RN 03/20/2017, 3:27 PM

## 2017-03-20 NOTE — Progress Notes (Signed)
Physical Therapy Treatment Patient Details Name: Jon Anderson MRN: [id number] DOB: [birth year] Today's Date: 03/20/2017    History of Present Illness pt is a 66 y/o male with pmh significant for CKD on HD, Chronic diastolic HF, recent CABGx1, HTN, DM, presnting to the ED with c/o central to left-sided chest pain.  Work up incl volume overload, probable HAP.    PT Comments    Pt progressing well.  Emphasis on gait with cane and stairs.   Follow Up Recommendations  Home health PT;Other (comment);Supervision/Assistance - 24 hour     Equipment Recommendations  None recommended by PT    Recommendations for Other Services       Precautions / Restrictions Precautions Precautions: Fall    Mobility  Bed Mobility Overal bed mobility: Modified Independent                Transfers Overall transfer level: Needs assistance Equipment used: Straight cane Transfers: Sit to/from Stand Sit to Stand: Supervision         General transfer comment: used cane appropriately  Ambulation/Gait Ambulation/Gait assistance: Supervision Ambulation Distance (Feet): 280 Feet Assistive device: Straight cane Gait Pattern/deviations: Step-through pattern;Decreased stance time - right;Decreased stride length   Gait velocity interpretation: Below normal speed for age/gender General Gait Details: stable, mildly antalgic gait on R with less degradation of gait pattern due to pain today.  Cued pt to try can on the Left which worked better on the pain.   Stairs Stairs: Yes   Stair Management: One rail Right;Step to pattern;Forwards Number of Stairs: 3 General stair comments: safe on steps with cane and "post"  Wheelchair Mobility    Modified Rankin (Stroke Patients Only)       Balance     Sitting balance-Leahy Scale: Good       Standing balance-Leahy Scale: Fair                              Cognition Arousal/Alertness: Awake/alert Behavior During Therapy:  WFL for tasks assessed/performed Overall Cognitive Status: Within Functional Limits for tasks assessed                                        Exercises      General Comments General comments (skin integrity, edema, etc.): sats remained in mid 90's      Pertinent Vitals/Pain Pain Assessment: 0-10 Pain Score: 5  Pain Location: R LE Pain Descriptors / Indicators: Guarding Pain Intervention(s): Monitored during session    Home Living                      Prior Function            PT Goals (current goals can now be found in the care plan section) Acute Rehab PT Goals Patient Stated Goal: able to get to my appointment on the 21st PT Goal Formulation: With patient Time For Goal Achievement: 04/02/17 Potential to Achieve Goals: Good Progress towards PT goals: Progressing toward goals    Frequency    Min 3X/week      PT Plan Current plan remains appropriate    Co-evaluation              AM-PAC PT "6 Clicks" Daily Activity  Outcome Measure  Difficulty turning over in bed (including adjusting bedclothes, sheets and blankets)?: A Little  Difficulty moving from lying on back to sitting on the side of the bed? : A Little Difficulty sitting down on and standing up from a chair with arms (e.g., wheelchair, bedside commode, etc,.)?: A Little Help needed moving to and from a bed to chair (including a wheelchair)?: A Little Help needed walking in hospital room?: A Little Help needed climbing 3-5 steps with a railing? : A Little 6 Click Score: 18    End of Session   Activity Tolerance: Patient tolerated treatment well Patient left: in chair;with call bell/phone within reach;with chair alarm set Nurse Communication: Mobility status PT Visit Diagnosis: Other abnormalities of gait and mobility (R26.89);Unsteadiness on feet (R26.81);Pain Pain - Right/Left: Right Pain - part of body: Hip;Leg     Time: 1730-1755 PT Time Calculation (min) (ACUTE  ONLY): 25 min  Charges:  $Gait Training: 8-22 mins $Therapeutic Activity: 8-22 mins                    G Codes:       04/09/2017  Donnella Sham, PT [phone]  (pager)   Tessie Fass Matson [place] 04-09-2017, 6:00 PM

## 2017-03-20 NOTE — Progress Notes (Signed)
PROGRESS NOTE    Jon Anderson  PYP:[id number] DOB: 03-07-1951 DOA: 03/17/2017 PCP: [name], MD     Brief Narrative:  Jon Anderson is a 66 year old male with history of end-stage renal disease on dialysis, chronic diastolic heart failure, coronary artery disease status post recent CABG in October 2018 at [facility], hypertension, anemia of chronic disease, dyslipidemia and diabetes presented with chest pain and shortness of breath.  He was found to be hypertensive and was admitted with heart failure.  Nephrology and cardiology were consulted.  Assessment & Plan:   Principal Problem:   Chest pain Active Problems:   Precordial chest pain   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/[facility]   Anemia of chronic disease   Severe uncontrolled hypertension   ESRD (end stage renal disease) on dialysis (HCC)   Hyperlipidemia   Chronic chest pain   Type II diabetes mellitus (HCC)   Acute on chronic diastolic heart failure (HCC)   Acute heart failure (HCC)   Chest pain -Troponin minimally elevated but flat.  Cardiology following.  Echo 12/9: EF 24-58%, grade 2 diastolic dysfunction, no regional wall motion abnormalities.  Probably secondary to elevated BP and heart failure. No further ischemic work up planned.  -No chest pain on my exam this morning   Coronary artery disease status post recent CABG  -Continue aspirin, Plavix, statin  Hypertension -Continue metoprolol, lisinopril, amlodipine  Acute on chronic diastolic heart failure -Cardiology following.  Echo EF 09-98%, grade 2 diastolic dysfunction.  Fluid being managed by dialysis.  Continue lisinopril, metoprolol -Daily weights.  Strict input and output  Acute metabolic/toxic encephalopathy due to multiple sedative medications -Patient was very drowsy on 03/18/2017.  Most of his sedative medications and pain medications including Requip were held -CT head on 03/18/2017 was unremarkable -Resumed Requip -Stable, alert and oriented this  morning  End-stage renal disease on dialysis MWF  -Nephrology following  Probable healthcare associated pneumonia, evolving on admission -Empirically started on cefepime and vancomycin.  -CXR on 12/10: Overall, aeration is improved with small bilateral pleural effusions and associated atelectasis/ consolidation. Coarsened interstitial markings may reflect mild pulmonary edema and/ or atypical infection  -Blood culture negative to date  -Has remained afebrile and normal WBC. Deescalate to levaquin today   Dyslipidemia -Continue Lipitor  Diabetes mellitus type 2 -Apparently he is off insulin pump recently.  Continue SSI   Generalized deconditioning -PT/OT eval   DVT prophylaxis: subq hep Code Status: full Family Communication: no family at bedside Disposition Plan: pending improvement   Consultants:   Cardiology  Nephrology  Antimicrobials:  Anti-infectives (From admission, onward)   Start     Dose/Rate Route Frequency Ordered Stop   03/18/17 1800  vancomycin (VANCOCIN) 1,500 mg in sodium chloride 0.9 % 500 mL IVPB  Status:  Discontinued     1,500 mg 250 mL/hr over 120 Minutes Intravenous Every 12 hours 03/18/17 1630 03/19/17 1031   03/18/17 1800  ceFEPIme (MAXIPIME) 1 g in dextrose 5 % 50 mL IVPB     1 g 100 mL/hr over 30 Minutes Intravenous Every 24 hours 03/18/17 1630          Subjective: No new complaints, states the bed is uncomfortable. Denies chest pain, SOB.   Objective: Vitals:   03/19/17 1923 03/20/17 0347 03/20/17 0446 03/20/17 0832  BP: 134/75  (!) 142/96 (!) 169/84  Pulse: 82 81 89 86  Resp: 18  16   Temp: 98.8 F (37.1 C)  98.1 F (36.7 C)   TempSrc:  Oral  Oral   SpO2: 98% 96% 97%   Weight:   71.8 kg (158 lb 4.8 oz)   Height:        Intake/Output Summary (Last 24 hours) at 03/20/2017 1159 Last data filed at 03/19/2017 2358 Gross per 24 hour  Intake 240 ml  Output -  Net 240 ml   Filed Weights   03/19/17 0820 03/19/17 1155  03/20/17 0446  Weight: 72.4 kg (159 lb 9.8 oz) 69.2 kg (152 lb 8.9 oz) 71.8 kg (158 lb 4.8 oz)    Examination:  General exam: Appears calm and comfortable  Respiratory system: Clear to auscultation. Respiratory effort normal. Cardiovascular system: S1 & S2 heard, RRR. No JVD, murmurs, rubs, gallops or clicks. No pedal edema. Gastrointestinal system: Abdomen is nondistended, soft and nontender. No organomegaly or masses felt. Normal bowel sounds heard. Central nervous system: Alert and oriented. No focal neurological deficits. Extremities: Symmetric 5 x 5 power. Skin: No rashes, lesions or ulcers Psychiatry: Judgement and insight appear normal. Mood & affect appropriate.   Data Reviewed: I have personally reviewed following labs and imaging studies  CBC: Recent Labs  Lab 03/17/17 0944 03/17/17 1133 03/18/17 1523 03/19/17 0342 03/20/17 0819  WBC 9.1  --  5.5 7.3 7.5  NEUTROABS  --   --  2.0 3.0  --   HGB 12.6* 15.0 12.2* 12.0* 13.1  HCT 39.8 44.0 38.5* 38.8* 41.0  MCV 93.0  --  93.7 93.7 92.1  PLT 208  --  185 205 673   Basic Metabolic Panel: Recent Labs  Lab 03/17/17 0944 03/17/17 1133 03/18/17 1523 03/19/17 0342 03/19/17 1541 03/20/17 0819  NA 134* 138 135 135  --  134*  K 3.2* 3.1* 3.6 3.7  --  4.0  CL 94* 95* 95* 95*  --  97*  CO2 29  --  31 28  --  27  GLUCOSE 106* 87 83 71  --  93  BUN 12 13 12 18   --  17  CREATININE 6.58* 6.30* 6.47* 7.57*  --  7.03*  CALCIUM 10.5*  --  10.8* 11.3*  --  10.3  MG  --   --  1.9 2.0  --   --   PHOS  --   --   --   --  3.1  --    GFR: Estimated Creatinine Clearance: 10.4 mL/min (A) (by C-G formula based on SCr of 7.03 mg/dL (H)). Liver Function Tests: Recent Labs  Lab 03/18/17 1523  AST 15  ALT 10*  ALKPHOS 61  BILITOT 0.6  PROT 6.4*  ALBUMIN 3.2*   No results for input(s): LIPASE, AMYLASE in the last 168 hours. No results for input(s): AMMONIA in the last 168 hours. Coagulation Profile: No results for input(s):  INR, PROTIME in the last 168 hours. Cardiac Enzymes: Recent Labs  Lab 03/17/17 1523 03/17/17 1815 03/17/17 2116  TROPONINI 0.06* 0.07* 0.07*   BNP (last 3 results) No results for input(s): PROBNP in the last 8760 hours. HbA1C: Recent Labs    03/17/17 1523  HGBA1C 4.8   CBG: Recent Labs  Lab 03/19/17 2339 03/19/17 2355 03/20/17 0024 03/20/17 0445 03/20/17 0747  GLUCAP 70 67 118* 102* 87   Lipid Profile: No results for input(s): CHOL, HDL, LDLCALC, TRIG, CHOLHDL, LDLDIRECT in the last 72 hours. Thyroid Function Tests: No results for input(s): TSH, T4TOTAL, FREET4, T3FREE, THYROIDAB in the last 72 hours. Anemia Panel: No results for input(s): VITAMINB12, FOLATE, FERRITIN, TIBC, IRON, RETICCTPCT in  the last 72 hours. Sepsis Labs: No results for input(s): PROCALCITON, LATICACIDVEN in the last 168 hours.  Recent Results (from the past 240 hour(s))  Culture, blood (routine x 2)     Status: None (Preliminary result)   Collection Time: 03/18/17  4:54 PM  Result Value Ref Range Status   Specimen Description BLOOD BLOOD RIGHT ARM  Final   Special Requests IN PEDIATRIC BOTTLE Blood Culture adequate volume  Final   Culture NO GROWTH < 24 HOURS  Final   Report Status PENDING  Incomplete  Culture, blood (routine x 2)     Status: None (Preliminary result)   Collection Time: 03/18/17  5:00 PM  Result Value Ref Range Status   Specimen Description BLOOD BLOOD RIGHT ARM  Final   Special Requests IN PEDIATRIC BOTTLE Blood Culture adequate volume  Final   Culture NO GROWTH < 24 HOURS  Final   Report Status PENDING  Incomplete  MRSA PCR Screening     Status: None   Collection Time: 03/18/17  5:00 PM  Result Value Ref Range Status   MRSA by PCR NEGATIVE NEGATIVE Final    Comment:        The GeneXpert MRSA Assay (FDA approved for NASAL specimens only), is one component of a comprehensive MRSA colonization surveillance program. It is not intended to diagnose MRSA infection nor to  guide or monitor treatment for MRSA infections.   Culture, Urine     Status: None   Collection Time: 03/18/17  5:00 PM  Result Value Ref Range Status   Specimen Description URINE, CLEAN CATCH  Final   Special Requests NONE  Final   Culture NO GROWTH  Final   Report Status 03/19/2017 FINAL  Final       Radiology Studies: Dg Chest 2 View  Result Date: 03/19/2017 CLINICAL DATA:  66 year old male with a history of shortness of breath and weakness EXAM: CHEST  2 VIEW COMPARISON:  03/18/2017, 03/17/2017 FINDINGS: Cardiomediastinal silhouette unchanged in size and contour. Calcifications of the aortic arch. Coarsened interstitial markings, similar to prior. No significant interlobular septal thickening. Lateral view demonstrates meniscus, corresponding to opacity at the right lung base and blunting of the left costophrenic angle. IMPRESSION: Overall, aeration is improved with small bilateral pleural effusions and associated atelectasis/ consolidation. Coarsened interstitial markings may reflect mild pulmonary edema and/ or atypical infection. Electronically Signed   By: Corrie Mckusick D.O.   On: 03/19/2017 08:16   Ct Head Wo Contrast  Result Date: 03/18/2017 CLINICAL DATA:  Altered mental status with lethargy EXAM: CT HEAD WITHOUT CONTRAST TECHNIQUE: Contiguous axial images were obtained from the base of the skull through the vertex without intravenous contrast. COMPARISON:  None. FINDINGS: Brain: There is mild diffuse atrophy. There is no intracranial mass, hemorrhage, extra-axial fluid collection, or midline shift. There is patchy small vessel disease in the centra semiovale bilaterally. Elsewhere gray-white compartments appear normal. No acute infarct is demonstrable. Vascular: There is no appreciable hyperdense vessel. There is calcification in each carotid siphon region. There is also calcification in each distal vertebral artery. Skull: The bony calvarium appears intact. Sinuses/Orbits: There is  mucosal thickening in several ethmoid air cells bilaterally. Visualized paranasal sinuses otherwise clear. Orbits appear symmetric bilaterally. Other: Mastoid air cells are clear. IMPRESSION: Atrophy with periventricular small vessel disease. No intracranial mass, hemorrhage, or evidence of acute infarct. There are foci of arterial vascular calcification. There is mucosal thickening in several ethmoid air cells bilaterally. Electronically Signed   By: Gwyndolyn Saxon  Jasmine December III M.D.   On: 03/18/2017 14:45   Dg Chest Port 1 View  Result Date: 03/18/2017 CLINICAL DATA:  Shortness of Breath EXAM: PORTABLE CHEST 1 VIEW COMPARISON:  March 17, 2017 FINDINGS: There is airspace consolidation in the left lower lobe with small left pleural effusion. There is atelectatic change in the right base. Lungs elsewhere clear. Heart is upper normal in size with pulmonary vascularity within normal limits. No adenopathy. There is aortic atherosclerosis. No evident bone lesions. IMPRESSION: Airspace consolidation left lower lobe with small left pleural effusion. Airspace consolidation left base has increased significantly compared to 1 day prior. Patchy atelectasis remains right base. Stable cardiac silhouette. There is aortic atherosclerosis. Aortic Atherosclerosis (ICD10-I70.0). Electronically Signed   By: Lowella Grip III M.D.   On: 03/18/2017 13:57      Scheduled Meds: . amLODipine  5 mg Oral QHS  . aspirin EC  81 mg Oral Daily  . atorvastatin  40 mg Oral q1800  . clopidogrel  75 mg Oral Q breakfast  . heparin  5,000 Units Subcutaneous Q8H  . insulin aspart  0-9 Units Subcutaneous Q4H  . lisinopril  20 mg Oral QHS  . metoprolol tartrate  25 mg Oral BID  . multivitamin  1 tablet Oral QHS  . pantoprazole  40 mg Oral QHS  . rOPINIRole  0.5 mg Oral QHS   Continuous Infusions: . ceFEPime (MAXIPIME) IV Stopped (03/19/17 2037)     LOS: 3 days    Time spent: 40 minutes   Dessa Phi, DO [facility] [email or web address] Password [email or web address] 03/20/2017, 11:59 AM

## 2017-03-20 NOTE — Progress Notes (Signed)
Progress Note  Patient Name: Jon Anderson Date of Encounter: 03/20/2017  Primary Cardiologist: [facility]  Subjective   Had some CP last pm after HD, did not tell staff, not sure when, it resolved spontaneously. No CP overnight.  Inpatient Medications    Scheduled Meds: . amLODipine  5 mg Oral QHS  . aspirin EC  81 mg Oral Daily  . atorvastatin  40 mg Oral q1800  . clopidogrel  75 mg Oral Q breakfast  . heparin  5,000 Units Subcutaneous Q8H  . insulin aspart  0-9 Units Subcutaneous Q4H  . lisinopril  20 mg Oral QHS  . metoprolol tartrate  12.5 mg Oral BID  . multivitamin  1 tablet Oral QHS  . pantoprazole  40 mg Oral QHS  . rOPINIRole  0.5 mg Oral QHS   Continuous Infusions: . ceFEPime (MAXIPIME) IV Stopped (03/19/17 2037)   PRN Meds: acetaminophen, hydrALAZINE, hydrALAZINE, nitroGLYCERIN, ondansetron (ZOFRAN) IV   Vital Signs    Vitals:   03/19/17 1923 03/20/17 0347 03/20/17 0446 03/20/17 0832  BP: 134/75  (!) 142/96 (!) 169/84  Pulse: 82 81 89 86  Resp: 18  16   Temp: 98.8 F (37.1 C)  98.1 F (36.7 C)   TempSrc: Oral  Oral   SpO2: 98% 96% 97%   Weight:   158 lb 4.8 oz (71.8 kg)   Height:        Intake/Output Summary (Last 24 hours) at 03/20/2017 0944 Last data filed at 03/19/2017 2358 Gross per 24 hour  Intake 240 ml  Output 3000 ml  Net -2760 ml   Filed Weights   03/19/17 0820 03/19/17 1155 03/20/17 0446  Weight: 159 lb 9.8 oz (72.4 kg) 152 lb 8.9 oz (69.2 kg) 158 lb 4.8 oz (71.8 kg)    Telemetry    SR, Rate high-nl, a little lower after HD yesterday. PVCs are rare, one pair - Personally Reviewed  ECG    N/A  Physical Exam   GEN: WD, WN male, NAD.   Neck: JVD 8 cm Cardiac: Reg R&R, soft SEM, no rub/gallop  Respiratory: few rales bases, generally clear GI: +BS, soft, NT, ND MS: No edema, no deformity Neuro:  No focal deficits Psych: nl affect  Labs    Chemistry Recent Labs  Lab 03/18/17 1523 03/19/17 0342 03/20/17 0819  NA  135 135 134*  K 3.6 3.7 4.0  CL 95* 95* 97*  CO2 _0 GLUCOSE 83 71 93  BUN _1 CREATININE 6.47* 7.57* 7.03*  CALCIUM 10.8* 11.3* 10.3  PROT 6.4*  --   --   ALBUMIN 3.2*  --   --   AST 15  --   --   ALT 10*  --   --   ALKPHOS 61  --   --   BILITOT 0.6  --   --   GFRNONAA 8* 7* 7*  GFRAA 9* 8* 8*  ANIONGAP _2 Hematology Recent Labs  Lab 03/18/17 1523 03/19/17 0342 03/20/17 0819  WBC 5.5 7.3 7.5  RBC 4.11* 4.14* 4.45  HGB 12.2* 12.0* 13.1  HCT 38.5* 38.8* 41.0  MCV 93.7 93.7 92.1  MCH 29.7 29.0 29.4  MCHC 31.7 30.9 32.0  RDW 16.3* 16.3* 15.8*  PLT 185 205 201    Cardiac Enzymes Recent Labs  Lab 03/17/17 1523 03/17/17 1815 03/17/17 2116  TROPONINI 0.06* 0.07* 0.07*    Recent Labs  Lab 03/17/17 8469  TROPIPOC 0.04     BNP Recent Labs  Lab 03/17/17 1124  BNP 3,394.7*     Radiology    Dg Chest 2 View  Result Date: 03/19/2017 CLINICAL DATA:  66 year old male with a history of shortness of breath and weakness EXAM: CHEST  2 VIEW COMPARISON:  03/18/2017, 03/17/2017 FINDINGS: Cardiomediastinal silhouette unchanged in size and contour. Calcifications of the aortic arch. Coarsened interstitial markings, similar to prior. No significant interlobular septal thickening. Lateral view demonstrates meniscus, corresponding to opacity at the right lung base and blunting of the left costophrenic angle. IMPRESSION: Overall, aeration is improved with small bilateral pleural effusions and associated atelectasis/ consolidation. Coarsened interstitial markings may reflect mild pulmonary edema and/ or atypical infection. Electronically Signed   By: Corrie Mckusick D.O.   On: 03/19/2017 08:16   Ct Head Wo Contrast  Result Date: 03/18/2017 CLINICAL DATA:  Altered mental status with lethargy EXAM: CT HEAD WITHOUT CONTRAST TECHNIQUE: Contiguous axial images were obtained from the base of the skull through the vertex without intravenous contrast. COMPARISON:  None.  FINDINGS: Brain: There is mild diffuse atrophy. There is no intracranial mass, hemorrhage, extra-axial fluid collection, or midline shift. There is patchy small vessel disease in the centra semiovale bilaterally. Elsewhere gray-white compartments appear normal. No acute infarct is demonstrable. Vascular: There is no appreciable hyperdense vessel. There is calcification in each carotid siphon region. There is also calcification in each distal vertebral artery. Skull: The bony calvarium appears intact. Sinuses/Orbits: There is mucosal thickening in several ethmoid air cells bilaterally. Visualized paranasal sinuses otherwise clear. Orbits appear symmetric bilaterally. Other: Mastoid air cells are clear. IMPRESSION: Atrophy with periventricular small vessel disease. No intracranial mass, hemorrhage, or evidence of acute infarct. There are foci of arterial vascular calcification. There is mucosal thickening in several ethmoid air cells bilaterally. Electronically Signed   By: Lowella Grip III M.D.   On: 03/18/2017 14:45   Dg Chest Port 1 View  Result Date: 03/18/2017 CLINICAL DATA:  Shortness of Breath EXAM: PORTABLE CHEST 1 VIEW COMPARISON:  March 17, 2017 FINDINGS: There is airspace consolidation in the left lower lobe with small left pleural effusion. There is atelectatic change in the right base. Lungs elsewhere clear. Heart is upper normal in size with pulmonary vascularity within normal limits. No adenopathy. There is aortic atherosclerosis. No evident bone lesions. IMPRESSION: Airspace consolidation left lower lobe with small left pleural effusion. Airspace consolidation left base has increased significantly compared to 1 day prior. Patchy atelectasis remains right base. Stable cardiac silhouette. There is aortic atherosclerosis. Aortic Atherosclerosis (ICD10-I70.0). Electronically Signed   By: Lowella Grip III M.D.   On: 03/18/2017 13:57    Cardiac Studies   ECHO: 03/18/2017 - Left  ventricle: The cavity size was normal. Wall thickness was   increased in a pattern of moderate LVH. Systolic function was   normal. The estimated ejection fraction was in the range of 55%   to 60%. Wall motion was normal; there were no regional wall   motion abnormalities. Features are consistent with a pseudonormal   left ventricular filling pattern, with concomitant abnormal   relaxation and increased filling pressure (grade 2 diastolic dysfunction). - Mitral valve: Severely calcified annulus. The findings are   consistent with mild stenosis. - Left atrium: The atrium was severely dilated. Impressions: - Normal LV systolic function; moderate diastolic dysfunction;   moderate LVH; severe MAC with mild MS (mean gradient 5 mmHg) and   trace MR; severe LAE; mild TR.  Patient  Profile     66 y.o. male with hx of CAD s/p CABG LIMA to LAD at [facility] 01/2017, ESRD on HD, HTN, HLD, DM, chronic diastolic CHF and anemia presented with chest pain and shortness of breath. Found to be hypertensive and acute CHF.   Assessment & Plan    1. Chest pain with hx CAD s/p recent CABG - slight elevation w/ flat trend, felt 2nd acute illness - on ASA, Plavix, BB, statin - ambulate and see if CP w/ ambulation, may need to add Imdur  2. Acute on chronic diastolic CHF - volume management w/ HD - Nephrology has lowered dry wt to 69.2 kg  3. HTN - SBP 130s-190s last 24 hr but generally a little lower than previous - metoprolol 12.5 mg bid, will increase to 25 mg bid, give am dose after HD on dialysis days - can increase amlodipine to 5 mg bid, hold am dose on HD days, per MD  4. HLD - on statin - per IM  5. ESRD on HD - per IM and Nephrology  For questions or updates, please contact [place] Please consult [email or web address] for contact info under Cardiology/STEMI.      Signed, Rosaria Ferries, PA-C  03/20/2017, 9:44 AM    History and all data above reviewed.  Patient examined. He mentions some  vague chest pain.  No SOB.   I agree with the findings as above.  The patient exam reveals COR:RRR  ,  Lungs: Clear  ,  Abd: Positive bowel sounds, no rebound no guarding, Ext No edema  .  All available labs, radiology testing, previous records reviewed. Agree with documented assessment and plan. Chest pain.  Vague.  Agree with increased beta blocker.  No further ischemia work up is planned.   [name] Phylis Javed  10:29 AM  03/20/2017

## 2017-03-20 NOTE — Progress Notes (Signed)
SATURATION QUALIFICATIONS: (This note is used to comply with regulatory documentation for home oxygen) ? ?Patient Saturations on Room Air at Rest = 97% ? ?Patient Saturations on Room Air while Ambulating = 98% ? ? ?

## 2017-03-20 NOTE — Evaluation (Signed)
Occupational Therapy Evaluation and Discharge Patient Details Name: Jon Anderson MRN: [id number] DOB: Nov 15, 1950 Today's Date: 03/20/2017    History of Present Illness pt is a 66 y/o male with pmh significant for CKD on HD, Chronic diastolic HF, recent CABGx1, HTN, DM, presnting to the ED with c/o central to left-sided chest pain.  Work up incl volume overload, probable HAP.   Clinical Impression   Pt primarily limited by radicular pain down his R hip. Overall performing ADL and ADL transfers at a supervision level for safety. No OT needs.   Follow Up Recommendations  No OT follow up    Equipment Recommendations  None recommended by OT    Recommendations for Other Services       Precautions / Restrictions Precautions Precautions: Fall Restrictions Weight Bearing Restrictions: No      Mobility Bed Mobility Overal bed mobility: Modified Independent             General bed mobility comments: performed log roll method  Transfers Overall transfer level: Needs assistance Equipment used: None Transfers: Sit to/from Stand Sit to Stand: Supervision         General transfer comment: for safety, increased time    Balance     Sitting balance-Leahy Scale: Good       Standing balance-Leahy Scale: Fair                             ADL either performed or assessed with clinical judgement   ADL                                         General ADL Comments: pt requires supervision for safety, no physical assist for ADL, stood x 8 minutes at sink for grooming     Vision Patient Visual Report: No change from baseline       Perception     Praxis      Pertinent Vitals/Pain Pain Assessment: Faces Faces Pain Scale: Hurts even more Pain Location: R LE Pain Descriptors / Indicators: Aching;Grimacing;Guarding Pain Intervention(s): Monitored during session     Hand Dominance Right   Extremity/Trunk Assessment Upper Extremity  Assessment Upper Extremity Assessment: Overall WFL for tasks assessed   Lower Extremity Assessment Lower Extremity Assessment: Defer to PT evaluation   Cervical / Trunk Assessment Cervical / Trunk Assessment: Kyphotic   Communication Communication Communication: No difficulties   Cognition Arousal/Alertness: Awake/alert Behavior During Therapy: WFL for tasks assessed/performed Overall Cognitive Status: Within Functional Limits for tasks assessed                                     General Comments       Exercises     Shoulder Instructions      Home Living Family/patient expects to be discharged to:: Private residence Living Arrangements: Spouse/significant other;Children Available Help at Discharge: Family;Friend(s) Type of Home: House Home Access: Stairs to enter     Home Layout: One level     Bathroom Shower/Tub: Teacher, early years/pre: Standard     Home Equipment: Environmental consultant - 2 wheels;Cane - single point          Prior Functioning/Environment Level of Independence: Independent with assistive device(s)        Comments: Doesn't  get out much, but is independent with cane in the home.  Independent with ADL's        OT Problem List: Pain      OT Treatment/Interventions:      OT Goals(Current goals can be found in the care plan section) Acute Rehab OT Goals Patient Stated Goal: able to get to my appointment on the 21st  OT Frequency:     Barriers to D/C:            Co-evaluation              AM-PAC PT "6 Clicks" Daily Activity     Outcome Measure Help from another person eating meals?: None Help from another person taking care of personal grooming?: A Little Help from another person toileting, which includes using toliet, bedpan, or urinal?: A Little Help from another person bathing (including washing, rinsing, drying)?: A Little Help from another person to put on and taking off regular upper body clothing?:  None Help from another person to put on and taking off regular lower body clothing?: A Little 6 Click Score: 20   End of Session Equipment Utilized During Treatment: Gait belt  Activity Tolerance: Patient limited by pain Patient left: in chair;with call bell/phone within reach;with chair alarm set  OT Visit Diagnosis: Pain;Unsteadiness on feet (R26.81)                Time: 3818-4037 OT Time Calculation (min): 25 min Charges:  OT General Charges $OT Visit: 1 Visit OT Evaluation $OT Eval Moderate Complexity: 1 Mod OT Treatments $Self Care/Home Management : 8-22 mins G-Codes:     10-Apr-2017 Nestor Lewandowsky, OTR/L Pager: [phone]  Werner Lean, Haze Boyden 04-10-17, 12:16 PM

## 2017-03-20 NOTE — Progress Notes (Deleted)
PROGRESS NOTE    ERICSON NAFZIGER  KGU:[id number] DOB: 1951/01/13 DOA: 03/17/2017 PCP: [name], MD     Brief Narrative:  Jon Anderson is a 66 year old male with history of end-stage renal disease on dialysis, chronic diastolic heart failure, coronary artery disease status post recent CABG in October 2018 at [facility], hypertension, anemia of chronic disease, dyslipidemia and diabetes presented with chest pain and shortness of breath.  He was found to be hypertensive and was admitted with heart failure.  Nephrology and cardiology were consulted.  Assessment & Plan:   Principal Problem:   Chest pain Active Problems:   Precordial chest pain   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/[facility]   Anemia of chronic disease   Severe uncontrolled hypertension   ESRD (end stage renal disease) on dialysis (HCC)   Hyperlipidemia   Chronic chest pain   Type II diabetes mellitus (HCC)   Acute on chronic diastolic heart failure (HCC)   Acute heart failure (HCC)   Chest pain -Troponin minimally elevated but flat.  Cardiology following.  Echo 12/9: EF 62-83%, grade 2 diastolic dysfunction, no regional wall motion abnormalities.  Probably secondary to elevated BP and heart failure. No further ischemic work up planned.  -No chest pain on my exam this morning   Coronary artery disease status post recent CABG  -Continue aspirin, Plavix, statin  Hypertension -Continue metoprolol, lisinopril, amlodipine  Acute on chronic diastolic heart failure -Cardiology following.  Echo EF 15-17%, grade 2 diastolic dysfunction.  Fluid being managed by dialysis.  Continue lisinopril, metoprolol -Daily weights.  Strict input and output  Acute metabolic/toxic encephalopathy due to multiple sedative medications -Patient was very drowsy on 03/18/2017.  Most of his sedative medications and pain medications including Requip were held -CT head on 03/18/2017 was unremarkable -Resumed Requip -Stable, alert and oriented this  morning  End-stage renal disease on dialysis MWF  -Nephrology following  Probable healthcare associated pneumonia, evolving on admission -Empirically started on cefepime and vancomycin.  -CXR on 12/10: Overall, aeration is improved with small bilateral pleural effusions and associated atelectasis/ consolidation. Coarsened interstitial markings may reflect mild pulmonary edema and/ or atypical infection  -Blood culture negative to date  -Has remained afebrile and normal WBC. Deescalate to levaquin today   Dyslipidemia -Continue Lipitor  Diabetes mellitus type 2 -Apparently he is off insulin pump recently.  Continue SSI   Generalized deconditioning -PT/OT eval   DVT prophylaxis: subq hep Code Status: full Family Communication: no family at bedside Disposition Plan: pending improvement   Consultants:   Cardiology  Nephrology  Antimicrobials:  Anti-infectives (From admission, onward)   Start     Dose/Rate Route Frequency Ordered Stop   03/18/17 1800  vancomycin (VANCOCIN) 1,500 mg in sodium chloride 0.9 % 500 mL IVPB  Status:  Discontinued     1,500 mg 250 mL/hr over 120 Minutes Intravenous Every 12 hours 03/18/17 1630 03/19/17 1031   03/18/17 1800  ceFEPIme (MAXIPIME) 1 g in dextrose 5 % 50 mL IVPB  Status:  Discontinued     1 g 100 mL/hr over 30 Minutes Intravenous Every 24 hours 03/18/17 1630 03/20/17 1205       Subjective: No new complaints, states the bed is uncomfortable. Denies chest pain, SOB.   Objective: Vitals:   03/19/17 1923 03/20/17 0347 03/20/17 0446 03/20/17 0832  BP: 134/75  (!) 142/96 (!) 169/84  Pulse: 82 81 89 86  Resp: 18  16   Temp: 98.8 F (37.1 C)  98.1 F (36.7 C)  TempSrc: Oral  Oral   SpO2: 98% 96% 97%   Weight:   71.8 kg (158 lb 4.8 oz)   Height:        Intake/Output Summary (Last 24 hours) at 03/20/2017 1211 Last data filed at 03/19/2017 2358 Gross per 24 hour  Intake 240 ml  Output -  Net 240 ml   Filed Weights    03/19/17 0820 03/19/17 1155 03/20/17 0446  Weight: 72.4 kg (159 lb 9.8 oz) 69.2 kg (152 lb 8.9 oz) 71.8 kg (158 lb 4.8 oz)    Examination:  General exam: Appears calm and comfortable  Respiratory system: Clear to auscultation. Respiratory effort normal. Cardiovascular system: S1 & S2 heard, RRR. No JVD, murmurs, rubs, gallops or clicks. No pedal edema. Gastrointestinal system: Abdomen is nondistended, soft and nontender. No organomegaly or masses felt. Normal bowel sounds heard. Central nervous system: Alert and oriented. No focal neurological deficits. Extremities: Symmetric 5 x 5 power. Skin: No rashes, lesions or ulcers Psychiatry: Judgement and insight appear normal. Mood & affect appropriate.   Data Reviewed: I have personally reviewed following labs and imaging studies  CBC: Recent Labs  Lab 03/17/17 0944 03/17/17 1133 03/18/17 1523 03/19/17 0342 03/20/17 0819  WBC 9.1  --  5.5 7.3 7.5  NEUTROABS  --   --  2.0 3.0  --   HGB 12.6* 15.0 12.2* 12.0* 13.1  HCT 39.8 44.0 38.5* 38.8* 41.0  MCV 93.0  --  93.7 93.7 92.1  PLT 208  --  185 205 778   Basic Metabolic Panel: Recent Labs  Lab 03/17/17 0944 03/17/17 1133 03/18/17 1523 03/19/17 0342 03/19/17 1541 03/20/17 0819  NA 134* 138 135 135  --  134*  K 3.2* 3.1* 3.6 3.7  --  4.0  CL 94* 95* 95* 95*  --  97*  CO2 29  --  31 28  --  27  GLUCOSE 106* 87 83 71  --  93  BUN 12 13 12 18   --  17  CREATININE 6.58* 6.30* 6.47* 7.57*  --  7.03*  CALCIUM 10.5*  --  10.8* 11.3*  --  10.3  MG  --   --  1.9 2.0  --   --   PHOS  --   --   --   --  3.1  --    GFR: Estimated Creatinine Clearance: 10.4 mL/min (A) (by C-G formula based on SCr of 7.03 mg/dL (H)). Liver Function Tests: Recent Labs  Lab 03/18/17 1523  AST 15  ALT 10*  ALKPHOS 61  BILITOT 0.6  PROT 6.4*  ALBUMIN 3.2*   No results for input(s): LIPASE, AMYLASE in the last 168 hours. No results for input(s): AMMONIA in the last 168 hours. Coagulation  Profile: No results for input(s): INR, PROTIME in the last 168 hours. Cardiac Enzymes: Recent Labs  Lab 03/17/17 1523 03/17/17 1815 03/17/17 2116  TROPONINI 0.06* 0.07* 0.07*   BNP (last 3 results) No results for input(s): PROBNP in the last 8760 hours. HbA1C: Recent Labs    03/17/17 1523  HGBA1C 4.8   CBG: Recent Labs  Lab 03/19/17 2355 03/20/17 0024 03/20/17 0445 03/20/17 0747 03/20/17 1159  GLUCAP 67 118* 102* 87 98   Lipid Profile: No results for input(s): CHOL, HDL, LDLCALC, TRIG, CHOLHDL, LDLDIRECT in the last 72 hours. Thyroid Function Tests: No results for input(s): TSH, T4TOTAL, FREET4, T3FREE, THYROIDAB in the last 72 hours. Anemia Panel: No results for input(s): VITAMINB12, FOLATE, FERRITIN, TIBC, IRON, RETICCTPCT  in the last 72 hours. Sepsis Labs: No results for input(s): PROCALCITON, LATICACIDVEN in the last 168 hours.  Recent Results (from the past 240 hour(s))  Culture, blood (routine x 2)     Status: None (Preliminary result)   Collection Time: 03/18/17  4:54 PM  Result Value Ref Range Status   Specimen Description BLOOD BLOOD RIGHT ARM  Final   Special Requests IN PEDIATRIC BOTTLE Blood Culture adequate volume  Final   Culture NO GROWTH < 24 HOURS  Final   Report Status PENDING  Incomplete  Culture, blood (routine x 2)     Status: None (Preliminary result)   Collection Time: 03/18/17  5:00 PM  Result Value Ref Range Status   Specimen Description BLOOD BLOOD RIGHT ARM  Final   Special Requests IN PEDIATRIC BOTTLE Blood Culture adequate volume  Final   Culture NO GROWTH < 24 HOURS  Final   Report Status PENDING  Incomplete  MRSA PCR Screening     Status: None   Collection Time: 03/18/17  5:00 PM  Result Value Ref Range Status   MRSA by PCR NEGATIVE NEGATIVE Final    Comment:        The GeneXpert MRSA Assay (FDA approved for NASAL specimens only), is one component of a comprehensive MRSA colonization surveillance program. It is not intended  to diagnose MRSA infection nor to guide or monitor treatment for MRSA infections.   Culture, Urine     Status: None   Collection Time: 03/18/17  5:00 PM  Result Value Ref Range Status   Specimen Description URINE, CLEAN CATCH  Final   Special Requests NONE  Final   Culture NO GROWTH  Final   Report Status 03/19/2017 FINAL  Final       Radiology Studies: Dg Chest 2 View  Result Date: 03/19/2017 CLINICAL DATA:  66 year old male with a history of shortness of breath and weakness EXAM: CHEST  2 VIEW COMPARISON:  03/18/2017, 03/17/2017 FINDINGS: Cardiomediastinal silhouette unchanged in size and contour. Calcifications of the aortic arch. Coarsened interstitial markings, similar to prior. No significant interlobular septal thickening. Lateral view demonstrates meniscus, corresponding to opacity at the right lung base and blunting of the left costophrenic angle. IMPRESSION: Overall, aeration is improved with small bilateral pleural effusions and associated atelectasis/ consolidation. Coarsened interstitial markings may reflect mild pulmonary edema and/ or atypical infection. Electronically Signed   By: Corrie Mckusick D.O.   On: 03/19/2017 08:16   Ct Head Wo Contrast  Result Date: 03/18/2017 CLINICAL DATA:  Altered mental status with lethargy EXAM: CT HEAD WITHOUT CONTRAST TECHNIQUE: Contiguous axial images were obtained from the base of the skull through the vertex without intravenous contrast. COMPARISON:  None. FINDINGS: Brain: There is mild diffuse atrophy. There is no intracranial mass, hemorrhage, extra-axial fluid collection, or midline shift. There is patchy small vessel disease in the centra semiovale bilaterally. Elsewhere gray-white compartments appear normal. No acute infarct is demonstrable. Vascular: There is no appreciable hyperdense vessel. There is calcification in each carotid siphon region. There is also calcification in each distal vertebral artery. Skull: The bony calvarium  appears intact. Sinuses/Orbits: There is mucosal thickening in several ethmoid air cells bilaterally. Visualized paranasal sinuses otherwise clear. Orbits appear symmetric bilaterally. Other: Mastoid air cells are clear. IMPRESSION: Atrophy with periventricular small vessel disease. No intracranial mass, hemorrhage, or evidence of acute infarct. There are foci of arterial vascular calcification. There is mucosal thickening in several ethmoid air cells bilaterally. Electronically Signed   By: Gwyndolyn Saxon  Jasmine December III M.D.   On: 03/18/2017 14:45   Dg Chest Port 1 View  Result Date: 03/18/2017 CLINICAL DATA:  Shortness of Breath EXAM: PORTABLE CHEST 1 VIEW COMPARISON:  March 17, 2017 FINDINGS: There is airspace consolidation in the left lower lobe with small left pleural effusion. There is atelectatic change in the right base. Lungs elsewhere clear. Heart is upper normal in size with pulmonary vascularity within normal limits. No adenopathy. There is aortic atherosclerosis. No evident bone lesions. IMPRESSION: Airspace consolidation left lower lobe with small left pleural effusion. Airspace consolidation left base has increased significantly compared to 1 day prior. Patchy atelectasis remains right base. Stable cardiac silhouette. There is aortic atherosclerosis. Aortic Atherosclerosis (ICD10-I70.0). Electronically Signed   By: Lowella Grip III M.D.   On: 03/18/2017 13:57      Scheduled Meds: . amLODipine  5 mg Oral QHS  . aspirin EC  81 mg Oral Daily  . atorvastatin  40 mg Oral q1800  . clopidogrel  75 mg Oral Q breakfast  . heparin  5,000 Units Subcutaneous Q8H  . insulin aspart  0-9 Units Subcutaneous Q4H  . lisinopril  20 mg Oral QHS  . metoprolol tartrate  25 mg Oral BID  . multivitamin  1 tablet Oral QHS  . pantoprazole  40 mg Oral QHS  . rOPINIRole  0.5 mg Oral QHS   Continuous Infusions:    LOS: 3 days    Time spent: 40 minutes   Dessa Phi, DO [facility] [email or web address] Password [email or web address] 03/20/2017, 12:11 PM

## 2017-03-20 NOTE — Progress Notes (Signed)
ANTIBIOTIC CONSULT NOTE   Pharmacy Consult for Cefepime Indication: pneumonia  Allergies  Allergen Reactions  . Kiwi Extract Swelling    Facial swelling   . Tape Other (See Comments)    Plastic tape-blistering    Patient Measurements: Height: 5\' 11"  (180.3 cm) Weight: 158 lb 4.8 oz (71.8 kg) IBW/kg (Calculated) : 75.3 Adjusted Body Weight:   Vital Signs: Temp: 98.1 F (36.7 C) (12/11 0446) Temp Source: Oral (12/11 0446) BP: 169/84 (12/11 0832) Pulse Rate: 86 (12/11 0832) Intake/Output from previous day: 12/10 0701 - 12/11 0700 In: 240 [P.O.:240] Out: 3000  Intake/Output from this shift: No intake/output data recorded.  Labs: Recent Labs    03/18/17 1523 03/19/17 0342 03/20/17 0819  WBC 5.5 7.3 7.5  HGB 12.2* 12.0* 13.1  PLT 185 205 201  CREATININE 6.47* 7.57* 7.03*   Estimated Creatinine Clearance: 10.4 mL/min (A) (by C-G formula based on SCr of 7.03 mg/dL (H)). No results for input(s): VANCOTROUGH, VANCOPEAK, VANCORANDOM, GENTTROUGH, GENTPEAK, GENTRANDOM, TOBRATROUGH, TOBRAPEAK, TOBRARND, AMIKACINPEAK, AMIKACINTROU, AMIKACIN in the last 72 hours.   Microbiology:   Medical History: Past Medical History:  Diagnosis Date  . Anemia of chronic disease   . CAD (coronary artery disease)    a.  Myoview 4/11: EF 53%, no scar or ischemia   c. MV 2012 Nl perfusion, apical thinning.  No ischemia or scar.  EF 49%, appears greater by visual estimate.;  d.  Dob stress echo 12/13:  Negative Dob stress echo. There is no evidence of ischemia.  The LVF is normal. b. Normal cors 2016.  . Carotid stenosis    a. <47% RICA, >42% LICA by duplex 08/9561  . Chronic chest pain    occ  . ESRD (end stage renal disease) on dialysis ([place])   . GERD (gastroesophageal reflux disease)   . HTN (hypertension)    echo 3/10: EF 60%, LAE  . Hyperlipidemia   . Nephrolithiasis    "passed them all"  . Peripheral arterial disease ([place])    a. s/p PTCA to L SFA.  Marland Kitchen Pneumonia   . Restless legs    . Snores    a. presumed OSA, pt has refused sleep eval in past.  . Type II diabetes mellitus ([place])    Assessment: Abx for r/o HCAP. Afebrile. WBC 7.5.  Cefepime 12/9>> Vancomycin 12/9>> * Received total of 3g prior to HD 12/10 - holding doses for now  12/9 BCx >> 12/9 UCx >> Negative 12/9 MRSA PCR >> neg  Goal of Therapy:  Eradication of infection  Plan:  Cefepime 1g IV q 24h for ESRD Pharmacy will sign off. Please reconsult for further dosing assitance.   Jon Anderson, PharmD, BCPS Clinical Staff Pharmacist Pager [phone]  Jon Anderson 03/20/2017,10:39 AM

## 2017-03-20 NOTE — Progress Notes (Signed)
[facility] Progress Note   Dialysis Orders: [facility] MWF 4h 75.5kg 2K/2Ca Profile 2 L AVF Heparin 5000 Venofer 50mg  IV q week  Calcitriol 2.65mcg PO q HD   Assessment/Plan: 1. Dyspnea/pulmedema - lowering EDW net UF 3 L 12/10 with post wt 69.2 - standing -  2. Lethargy 12/9 - getting numerous sedating meds, have d/w primary 12/9 - more alert but sleeping poorly due to disc related pain per patient  3. Chest pain/CAD/ recent CABG 01/2017 -per cards - trop flat EF 55 - 60%, no pericardial effusion, severe LAD 4. ESRD -usual HD is MWF- HD in progress K 3.7 Monday- 4 K bath - low  K obviates use of 2 Ca bath; next HD Wednesday; draw am labs on HD 5. HTN - BP's still up on norvasc/ acei/ BB- continue to lower edw- cards may go up on BB 6. Anemia - Hgb 12 No ESA needs currently- draw labs on HD 7. Metabolic bone disease -Ca 11.3  not clear why hypercalcemic Holding Vit D/Ca acetate with Jamas Lav,. Readdress a outpatient HD unit after d/c - may need sensipar; changed to non Ca based binder; titrate up based on P; recheck with pre HD labs Wed 8. Nutrition -Renal diet/vitamins 9. Lumbar radiculopathy - L5-S1, L4-5 and L3-4 per MRI 01/2017 herniation affecting  Ride sided sacral nerve roots in the thecal sac, left L4 and right L3 nerve - followed by [place], PA-C [place] [phone] 03/20/2017,8:35 AM  LOS: 3 days   Pt seen, examined and agree w A/P as above.  Kelly Splinter MD [facility] pager [phone]   03/20/2017, 1:51 PM    Subjective:  C/o not being able to sleep or be comfortable in bed or recliner. Has herniated disc with pain radiation into right left. Followed by Dr. Sherwood Gambler.  Will need surgery in the future when cleared by cardiology.  Objective Vitals:   03/19/17 1923 03/20/17 0347 03/20/17 0446 03/20/17 0832  BP: 134/75  (!) 142/96 (!) 169/84  Pulse: 82 81 89 86  Resp: 18  16   Temp: 98.8  F (37.1 C)  98.1 F (36.7 C)   TempSrc: Oral  Oral   SpO2: 98% 96% 97%   Weight:   71.8 kg (158 lb 4.8 oz)   Height:       Physical Exam General: NAD sitting in recliner- having a hard time getting comfortable Heart: RRR Lungs: no rales Abdomen: soft NT Extremities: no LE edema Dialysis Access:  Left upper AVF + bruit   Additional Objective Labs: Basic Metabolic Panel: Recent Labs  Lab 03/17/17 0944 03/17/17 1133 03/18/17 1523 03/19/17 0342 03/19/17 1541  NA 134* 138 135 135  --   K 3.2* 3.1* 3.6 3.7  --   CL 94* 95* 95* 95*  --   CO2 29  --  31 28  --   GLUCOSE 106* 87 83 71  --   BUN 12 13 12 18   --   CREATININE 6.58* 6.30* 6.47* 7.57*  --   CALCIUM 10.5*  --  10.8* 11.3*  --   PHOS  --   --   --   --  3.1   Liver Function Tests: Recent Labs  Lab 03/18/17 1523  AST 15  ALT 10*  ALKPHOS 61  BILITOT 0.6  PROT 6.4*  ALBUMIN 3.2*   No results for input(s): LIPASE, AMYLASE in the last 168 hours. CBC: Recent Labs  Lab 03/17/17  6834 03/17/17 1133 03/18/17 1523 03/19/17 0342  WBC 9.1  --  5.5 7.3  NEUTROABS  --   --  2.0 3.0  HGB 12.6* 15.0 12.2* 12.0*  HCT 39.8 44.0 38.5* 38.8*  MCV 93.0  --  93.7 93.7  PLT 208  --  185 205   Blood Culture    Component Value Date/Time   SDES BLOOD BLOOD RIGHT ARM 03/18/2017 1700   SDES URINE, CLEAN CATCH 03/18/2017 1700   SPECREQUEST IN PEDIATRIC BOTTLE Blood Culture adequate volume 03/18/2017 1700   SPECREQUEST NONE 03/18/2017 1700   CULT NO GROWTH < 24 HOURS 03/18/2017 1700   CULT NO GROWTH 03/18/2017 1700   REPTSTATUS PENDING 03/18/2017 1700   REPTSTATUS 03/19/2017 FINAL 03/18/2017 1700    Cardiac Enzymes: Recent Labs  Lab 03/17/17 1523 03/17/17 1815 03/17/17 2116  TROPONINI 0.06* 0.07* 0.07*   CBG: Recent Labs  Lab 03/19/17 2339 03/19/17 2355 03/20/17 0024 03/20/17 0445 03/20/17 0747  GLUCAP 70 67 118* 102* 87   Iron Studies: No results for input(s): IRON, TIBC, TRANSFERRIN, FERRITIN in the  last 72 hours. Lab Results  Component Value Date   INR 1.01 06/22/2014   INR 1.09 03/25/2014   INR 1.01 12/05/2013   Studies/Results: Dg Chest 2 View  Result Date: 03/19/2017 CLINICAL DATA:  66 year old male with a history of shortness of breath and weakness EXAM: CHEST  2 VIEW COMPARISON:  03/18/2017, 03/17/2017 FINDINGS: Cardiomediastinal silhouette unchanged in size and contour. Calcifications of the aortic arch. Coarsened interstitial markings, similar to prior. No significant interlobular septal thickening. Lateral view demonstrates meniscus, corresponding to opacity at the right lung base and blunting of the left costophrenic angle. IMPRESSION: Overall, aeration is improved with small bilateral pleural effusions and associated atelectasis/ consolidation. Coarsened interstitial markings may reflect mild pulmonary edema and/ or atypical infection. Electronically Signed   By: Corrie Mckusick D.O.   On: 03/19/2017 08:16   Ct Head Wo Contrast  Result Date: 03/18/2017 CLINICAL DATA:  Altered mental status with lethargy EXAM: CT HEAD WITHOUT CONTRAST TECHNIQUE: Contiguous axial images were obtained from the base of the skull through the vertex without intravenous contrast. COMPARISON:  None. FINDINGS: Brain: There is mild diffuse atrophy. There is no intracranial mass, hemorrhage, extra-axial fluid collection, or midline shift. There is patchy small vessel disease in the centra semiovale bilaterally. Elsewhere gray-white compartments appear normal. No acute infarct is demonstrable. Vascular: There is no appreciable hyperdense vessel. There is calcification in each carotid siphon region. There is also calcification in each distal vertebral artery. Skull: The bony calvarium appears intact. Sinuses/Orbits: There is mucosal thickening in several ethmoid air cells bilaterally. Visualized paranasal sinuses otherwise clear. Orbits appear symmetric bilaterally. Other: Mastoid air cells are clear. IMPRESSION:  Atrophy with periventricular small vessel disease. No intracranial mass, hemorrhage, or evidence of acute infarct. There are foci of arterial vascular calcification. There is mucosal thickening in several ethmoid air cells bilaterally. Electronically Signed   By: Lowella Grip III M.D.   On: 03/18/2017 14:45   Dg Chest Port 1 View  Result Date: 03/18/2017 CLINICAL DATA:  Shortness of Breath EXAM: PORTABLE CHEST 1 VIEW COMPARISON:  March 17, 2017 FINDINGS: There is airspace consolidation in the left lower lobe with small left pleural effusion. There is atelectatic change in the right base. Lungs elsewhere clear. Heart is upper normal in size with pulmonary vascularity within normal limits. No adenopathy. There is aortic atherosclerosis. No evident bone lesions. IMPRESSION: Airspace consolidation left lower lobe with small  left pleural effusion. Airspace consolidation left base has increased significantly compared to 1 day prior. Patchy atelectasis remains right base. Stable cardiac silhouette. There is aortic atherosclerosis. Aortic Atherosclerosis (ICD10-I70.0). Electronically Signed   By: Lowella Grip III M.D.   On: 03/18/2017 13:57   Medications: . ceFEPime (MAXIPIME) IV Stopped (03/19/17 2037)   . amLODipine  5 mg Oral Daily  . aspirin EC  81 mg Oral Daily  . atorvastatin  40 mg Oral q1800  . clopidogrel  75 mg Oral Q breakfast  . heparin  5,000 Units Subcutaneous Q8H  . insulin aspart  0-9 Units Subcutaneous Q4H  . lisinopril  20 mg Oral QHS  . metoprolol tartrate  12.5 mg Oral BID  . multivitamin  1 tablet Oral QHS  . pantoprazole  40 mg Oral QHS  . rOPINIRole  0.5 mg Oral QHS

## 2017-03-20 NOTE — Progress Notes (Signed)
ANTIBIOTIC CONSULT NOTE - INITIAL  Pharmacy Consult for Levaquin Indication: HCAP  Allergies  Allergen Reactions  . Kiwi Extract Swelling    Facial swelling   . Tape Other (See Comments)    Plastic tape-blistering    Patient Measurements: Height: 5\' 11"  (180.3 cm) Weight: 158 lb 4.8 oz (71.8 kg) IBW/kg (Calculated) : 75.3 Adjusted Body Weight:    Vital Signs: Temp: 98.1 F (36.7 C) (12/11 0446) Temp Source: Oral (12/11 0446) BP: 169/84 (12/11 0832) Pulse Rate: 86 (12/11 0832) Intake/Output from previous day: 12/10 0701 - 12/11 0700 In: 240 [P.O.:240] Out: 3000  Intake/Output from this shift: No intake/output data recorded.  Labs: Recent Labs    03/18/17 1523 03/19/17 0342 03/20/17 0819  WBC 5.5 7.3 7.5  HGB 12.2* 12.0* 13.1  PLT 185 205 201  CREATININE 6.47* 7.57* 7.03*   Estimated Creatinine Clearance: 10.4 mL/min (A) (by C-G formula based on SCr of 7.03 mg/dL (H)). No results for input(s): VANCOTROUGH, VANCOPEAK, VANCORANDOM, GENTTROUGH, GENTPEAK, GENTRANDOM, TOBRATROUGH, TOBRAPEAK, TOBRARND, AMIKACINPEAK, AMIKACINTROU, AMIKACIN in the last 72 hours.   Microbiology:   Medical History: Past Medical History:  Diagnosis Date  . Anemia of chronic disease   . CAD (coronary artery disease)    a.  Myoview 4/11: EF 53%, no scar or ischemia   c. MV 2012 Nl perfusion, apical thinning.  No ischemia or scar.  EF 49%, appears greater by visual estimate.;  d.  Dob stress echo 12/13:  Negative Dob stress echo. There is no evidence of ischemia.  The LVF is normal. b. Normal cors 2016.  . Carotid stenosis    a. <78% RICA, >24% LICA by duplex 05/3534  . Chronic chest pain    occ  . ESRD (end stage renal disease) on dialysis ([place])   . GERD (gastroesophageal reflux disease)   . HTN (hypertension)    echo 3/10: EF 60%, LAE  . Hyperlipidemia   . Nephrolithiasis    "passed them all"  . Peripheral arterial disease ([place])    a. s/p PTCA to L SFA.  Marland Kitchen Pneumonia   . Restless  legs   . Snores    a. presumed OSA, pt has refused sleep eval in past.  . Type II diabetes mellitus ([place])     Assessment:  ID: Abx for r/o HCAP. MRSA PCR neg. Afeb, WBC wnl, ESRD-MWF  Levaquin 12/11>> Cefepime 12/9>>12/11 Vancomycin 12/9>>12/10 * Received total of 3g prior to HD 12/10 - holding doses for now  12/9 BCx >> 12/9 UCx >> Negative 12/9 MRSA PCR >> neg  Goal of Therapy:  Eradication of infection  Plan:  D/c Cefepime Levaquin 500mg  po q 48hr. Please consider abx change to Augmentin for discharge. Pharmacy will sign off. Please reconsult for further dosing assitance.    Jon Anderson, PharmD, BCPS Clinical Staff Pharmacist Pager [phone]  Jon Anderson 03/20/2017,1:33 PM

## 2017-03-20 NOTE — Plan of Care (Signed)
Patient maintains oxygen saturation levels above 95% on room air. Denies shortness of breath. Displays no signs of dyspnea or distress

## 2017-03-20 NOTE — Plan of Care (Signed)
  Progressing Activity: Risk for activity intolerance will decrease. Pt ambulated to bathroom and  with assistance and tolerated it well. No sob, or cp with activity. 03/20/2017 0440 - Progressing by Mellissa Kohut, RN Elimination: Will not experience complications related to bowel motility. Pt had small bowel movement today.  03/20/2017 0440 - Progressing by Mellissa Kohut, RN Safety: Ability to remain free from injury will improve. Fall risk bundle in place. Will continue to implement and maintain safety. No falls or other injuries this shift.  03/20/2017 0440 - Progressing by Mellissa Kohut, RN  Skin Integrity: Risk for impaired skin integrity will decrease. Pt repositioned in bed. Able to turn self independently. Pt had a bath and linens/gown were changed.  03/20/2017 0440 - Progressing by Mellissa Kohut, RN

## 2017-03-21 LAB — CBC
HCT: 38.9 % — ABNORMAL LOW (ref 39.0–52.0)
Hemoglobin: 12.5 g/dL — ABNORMAL LOW (ref 13.0–17.0)
MCH: 29.2 pg (ref 26.0–34.0)
MCHC: 32.1 g/dL (ref 30.0–36.0)
MCV: 90.9 fL (ref 78.0–100.0)
Platelets: 211 10*3/uL (ref 150–400)
RBC: 4.28 MIL/uL (ref 4.22–5.81)
RDW: 15.5 % (ref 11.5–15.5)
WBC: 6.3 10*3/uL (ref 4.0–10.5)

## 2017-03-21 LAB — BASIC METABOLIC PANEL
Anion gap: 13 (ref 5–15)
BUN: 27 mg/dL — ABNORMAL HIGH (ref 6–20)
CO2: 25 mmol/L (ref 22–32)
Calcium: 10.4 mg/dL — ABNORMAL HIGH (ref 8.9–10.3)
Chloride: 96 mmol/L — ABNORMAL LOW (ref 101–111)
Creatinine, Ser: 8.83 mg/dL — ABNORMAL HIGH (ref 0.61–1.24)
GFR calc Af Amer: 6 mL/min — ABNORMAL LOW (ref >60)
GFR calc non Af Amer: 5 mL/min — ABNORMAL LOW (ref >60)
Glucose, Bld: 86 mg/dL (ref 65–99)
Potassium: 3.9 mmol/L (ref 3.5–5.1)
Sodium: 134 mmol/L — ABNORMAL LOW (ref 135–145)

## 2017-03-21 LAB — GLUCOSE, CAPILLARY
Glucose-Capillary: 111 mg/dL — ABNORMAL HIGH (ref 65–99)
Glucose-Capillary: 115 mg/dL — ABNORMAL HIGH (ref 65–99)
Glucose-Capillary: 122 mg/dL — ABNORMAL HIGH (ref 65–99)
Glucose-Capillary: 166 mg/dL — ABNORMAL HIGH (ref 65–99)
Glucose-Capillary: 79 mg/dL (ref 65–99)
Glucose-Capillary: 90 mg/dL (ref 65–99)
Glucose-Capillary: 93 mg/dL (ref 65–99)

## 2017-03-21 LAB — TROPONIN I
Troponin I: 0.04 ng/mL (ref <0.03)
Troponin I: <0.03 ng/mL (ref <0.03)

## 2017-03-21 MED ORDER — ASPIRIN 81 MG PO CHEW
324.0000 mg | CHEWABLE_TABLET | Freq: Once | ORAL | Status: AC
  Administered 2017-03-21: 324 mg via ORAL
  Filled 2017-03-21: qty 4

## 2017-03-21 MED ORDER — ASPIRIN EC 81 MG PO TBEC
81.0000 mg | DELAYED_RELEASE_TABLET | Freq: Every day | ORAL | Status: DC
  Administered 2017-03-22: 81 mg via ORAL
  Filled 2017-03-21: qty 1

## 2017-03-21 MED ORDER — AMOXICILLIN-POT CLAVULANATE 500-125 MG PO TABS
500.0000 mg | ORAL_TABLET | Freq: Two times a day (BID) | ORAL | Status: DC
  Administered 2017-03-21 – 2017-03-22 (×2): 500 mg via ORAL
  Filled 2017-03-21 (×4): qty 1

## 2017-03-21 MED ORDER — GABAPENTIN 100 MG PO CAPS
200.0000 mg | ORAL_CAPSULE | Freq: Once | ORAL | Status: AC
  Administered 2017-03-21: 200 mg via ORAL
  Filled 2017-03-21: qty 2

## 2017-03-21 MED ORDER — MORPHINE SULFATE (PF) 2 MG/ML IV SOLN
2.0000 mg | INTRAVENOUS | Status: AC
  Administered 2017-03-21: 2 mg via INTRAVENOUS
  Filled 2017-03-21: qty 1

## 2017-03-21 MED ORDER — ISOSORBIDE MONONITRATE ER 60 MG PO TB24
60.0000 mg | ORAL_TABLET | Freq: Every day | ORAL | Status: DC
  Administered 2017-03-21 – 2017-03-22 (×2): 60 mg via ORAL
  Filled 2017-03-21 (×2): qty 1

## 2017-03-21 NOTE — Progress Notes (Signed)
Progress Note  Patient Name: Jon Anderson Date of Encounter: 03/21/2017  Primary Cardiologist:    [facility]   Subjective   Chest pain again last night.  EKG was not changed from old EKGs.   Trop is pending.   Inpatient Medications    Scheduled Meds: . amLODipine  5 mg Oral QHS  . [START ON 03/22/2017] aspirin EC  81 mg Oral Daily  . atorvastatin  40 mg Oral q1800  . clopidogrel  75 mg Oral Q breakfast  . heparin  5,000 Units Subcutaneous Q8H  . insulin aspart  0-9 Units Subcutaneous Q4H  . levofloxacin  500 mg Oral Q48H  . lidocaine  1 patch Transdermal Q24H  . lisinopril  20 mg Oral QHS  . metoprolol tartrate  25 mg Oral BID  . multivitamin  1 tablet Oral QHS  . pantoprazole  40 mg Oral QHS  . polyethylene glycol  17 g Oral Daily  . rOPINIRole  0.5 mg Oral QHS   Continuous Infusions:  PRN Meds: acetaminophen, hydrALAZINE, hydrALAZINE, ondansetron (ZOFRAN) IV, senna   Vital Signs    Vitals:   03/21/17 0540 03/21/17 0547 03/21/17 0559 03/21/17 0804  BP: (!) 173/89 127/77 140/84 138/73  Pulse:    86  Resp:      Temp:      TempSrc:      SpO2:      Weight:      Height:        Intake/Output Summary (Last 24 hours) at 03/21/2017 0847 Last data filed at 03/20/2017 2100 Gross per 24 hour  Intake 480 ml  Output -  Net 480 ml   Filed Weights   03/19/17 1155 03/20/17 0446 03/21/17 0341  Weight: 152 lb 8.9 oz (69.2 kg) 158 lb 4.8 oz (71.8 kg) 157 lb (71.2 kg)    Telemetry    NA (off telemetry currently on dialysis) - Personally Reviewed  ECG    NSR, repolarization changes.  03/21/2017  - Personally Reviewed  Physical Exam   GEN: No acute distress.   Neck: No  JVD Cardiac: RRR, no murmurs, rubs, or gallops.  Respiratory: Clear to auscultation bilaterally. GI: Soft, nontender, non-distended  MS: No  edema; No deformity. Neuro:  Nonfocal  Psych: Normal affect   Labs    Chemistry Recent Labs  Lab 03/18/17 1523 03/19/17 0342 03/20/17 0819  03/21/17 0540  NA 135 135 134* 134*  K 3.6 3.7 4.0 3.9  CL 95* 95* 97* 96*  CO2 _0 GLUCOSE 83 71 93 86  BUN _1 27*  CREATININE 6.47* 7.57* 7.03* 8.83*  CALCIUM 10.8* 11.3* 10.3 10.4*  PROT 6.4*  --   --   --   ALBUMIN 3.2*  --   --   --   AST 15  --   --   --   ALT 10*  --   --   --   ALKPHOS 61  --   --   --   BILITOT 0.6  --   --   --   GFRNONAA 8* 7* 7* 5*  GFRAA 9* 8* 8* 6*  ANIONGAP _2 Hematology Recent Labs  Lab 03/19/17 0342 03/20/17 0819 03/21/17 0540  WBC 7.3 7.5 6.3  RBC 4.14* 4.45 4.28  HGB 12.0* 13.1 12.5*  HCT 38.8* 41.0 38.9*  MCV 93.7 92.1 90.9  MCH 29.0 29.4 29.2  MCHC 30.9 32.0 32.1  RDW  16.3* 15.8* 15.5  PLT 205 201 211    Cardiac Enzymes Recent Labs  Lab 03/17/17 1523 03/17/17 1815 03/17/17 2116  TROPONINI 0.06* 0.07* 0.07*    Recent Labs  Lab 03/17/17 0942  TROPIPOC 0.04     BNP Recent Labs  Lab 03/17/17 1124  BNP 3,394.7*     DDimer No results for input(s): DDIMER in the last 168 hours.   Radiology    No results found.  Cardiac Studies   ECHO: 03/18/2017 - Left ventricle: The cavity size was normal. Wall thickness was increased in a pattern of moderate LVH. Systolic function was normal. The estimated ejection fraction was in the range of 55% to 60%. Wall motion was normal; there were no regional wall motion abnormalities. Features are consistent with a pseudonormal left ventricular filling pattern, with concomitant abnormal relaxation and increased filling pressure (grade 2 diastolicdysfunction). - Mitral valve: Severely calcified annulus. The findings are consistent with mild stenosis. - Left atrium: The atrium was severely dilated. Impressions: - Normal LV systolic function; moderate diastolic dysfunction; moderate LVH; severe MAC with mild MS (mean gradient 5 mmHg) and trace MR; severe LAE; mild TR.   Patient Profile     66 y.o. male with hx of CAD s/p CABG  LIMA to LAD at [facility] 01/2017, ESRD on HD, HTN, HLD, DM, chronic diastolic CHF and anemia presented with chest pain and shortness of breath. Found to be hypertensive and acute CHF.    Assessment & Plan    CHEST PAIN:  I again reviewed the [facility] records.  He had single vessel disease with a LIMA to the LAD.  He had a negative perfusion study prior to this.  At this point, pending the trop levels today, I am not planning another cath.  I will, however, titrate his meds.    ACUTE ON CHRONIC DIASTOLIC HF:  Volume management per dialysis.   HTN:  BP slightly up.    HLD:  Continue Lipitor.   ESRD:  On dialysis.    For questions or updates, please contact [place] Please consult [email or web address] for contact info under Cardiology/STEMI.   Signed, Minus Breeding, MD  03/21/2017, 8:47 AM

## 2017-03-21 NOTE — Progress Notes (Signed)
[facility] Progress Note  Dialysis Orders: [facility] MWF 4h 75.5kg 2K/2Ca Profile 2 L AVF Heparin 5000 Venofer 50mg  IV q week  Calcitriol 2.52mcg PO q HD  Assessment/Plan: 1. Dyspnea/pulmedema - lowering EDW net UF 3 L 12/10 with post wt 69.2 - standing; try for 3 L today to further lower volume - EDW down significantly 2. Lethargy12/9- getting numerous sedating meds, have d/w primary 12/9 - more alert but sleeping poorly due to disc related pain per patient  3. Chest pain/CAD/ recent CABG 01/2017 -per cards - trop flat EF 55 - 60%, no pericardial effusion, severe LAD; noted to have recurrent episodes of CP -defer to cardiology 4. ESRD -usual HD is MWF- HD in progress K 3.7 Monday- 4 K bath - low K obviates use of 2 Ca bath;; K 3.9 use 4 K bath 5. HTN - BP's still up on norvasc/ acei/ BB- continue to lower edw- cards may go up on BB 6. Anemia - Hgb 12.5 No ESA needs currently 7. Metabolic bone disease -Ca 11.3 12/10not clear why hypercalcemic Holding Vit D/Ca acetate with Jamas Lav,. Readdress a outpatient HD unit after d/c - may need sensipar; changed to non Ca based binder; Ca 10.4 12/12 8. Nutrition -Renal diet/vitamins 9. Lumbar radiculopathy - L5-S1, L4-5 and L3-4 per MRI 01/2017 herniation affecting  Ride sided sacral nerve roots in the thecal sac, left L4 and right L3 nerve - followed by [place] 10. Dispo - per primary, stable for d/c from renal standpoint   Myriam Jacobson, PA-C [place] [phone] 03/21/2017,8:46 AM  LOS: 4 days   Pt seen, examined, agree w assess/plan as above with additions as indicated.  Kelly Splinter MD [facility] pager [phone]    cell [phone] 03/21/2017, 10:09 AM     Subjective:   CP this am relived with 3 NT and 3 baby asa and MSO4. No problems at present. Had associated SOB.  Objective Vitals:   03/21/17 0540 03/21/17 0547 03/21/17 0559 03/21/17 0804  BP: (!) 173/89  127/77 140/84 138/73  Pulse:    86  Resp:      Temp:      TempSrc:      SpO2:      Weight:      Height:       Physical Exam General: NAD at present - supine no SOB - HD being initiated Heart: RRR Lungs: no rales Abdomen: soft NT Extremities: no LE edema Dialysis Access: left AVF + bruit   Additional Objective Labs: Basic Metabolic Panel: Recent Labs  Lab 03/19/17 0342 03/19/17 1541 03/20/17 0819 03/21/17 0540  NA 135  --  134* 134*  K 3.7  --  4.0 3.9  CL 95*  --  97* 96*  CO2 28  --  27 25  GLUCOSE 71  --  93 86  BUN 18  --  17 27*  CREATININE 7.57*  --  7.03* 8.83*  CALCIUM 11.3*  --  10.3 10.4*  PHOS  --  3.1  --   --    Liver Function Tests: Recent Labs  Lab 03/18/17 1523  AST 15  ALT 10*  ALKPHOS 61  BILITOT 0.6  PROT 6.4*  ALBUMIN 3.2*   No results for input(s): LIPASE, AMYLASE in the last 168 hours. CBC: Recent Labs  Lab 03/17/17 0944  03/18/17 1523 03/19/17 0342 03/20/17 0819 03/21/17 0540  WBC 9.1  --  5.5 7.3 7.5 6.3  NEUTROABS  --   --  2.0 3.0  --   --   HGB 12.6*   < > 12.2* 12.0* 13.1 12.5*  HCT 39.8   < > 38.5* 38.8* 41.0 38.9*  MCV 93.0  --  93.7 93.7 92.1 90.9  PLT 208  --  185 205 201 211   < > = values in this interval not displayed.   Blood Culture    Component Value Date/Time   SDES BLOOD BLOOD RIGHT ARM 03/18/2017 1700   SDES URINE, CLEAN CATCH 03/18/2017 1700   SPECREQUEST IN PEDIATRIC BOTTLE Blood Culture adequate volume 03/18/2017 1700   SPECREQUEST NONE 03/18/2017 1700   CULT NO GROWTH 2 DAYS 03/18/2017 1700   CULT NO GROWTH 03/18/2017 1700   REPTSTATUS PENDING 03/18/2017 1700   REPTSTATUS 03/19/2017 FINAL 03/18/2017 1700    Cardiac Enzymes: Recent Labs  Lab 03/17/17 1523 03/17/17 1815 03/17/17 2116  TROPONINI 0.06* 0.07* 0.07*   CBG: Recent Labs  Lab 03/20/17 1658 03/20/17 2030 03/21/17 0008 03/21/17 0349 03/21/17 0745  GLUCAP 124* 103* 79 93 90   Iron Studies: No results for input(s): IRON,  TIBC, TRANSFERRIN, FERRITIN in the last 72 hours. Lab Results  Component Value Date   INR 1.01 06/22/2014   INR 1.09 03/25/2014   INR 1.01 12/05/2013   Studies/Results: No results found. Medications:  . amLODipine  5 mg Oral QHS  . [START ON 03/22/2017] aspirin EC  81 mg Oral Daily  . atorvastatin  40 mg Oral q1800  . clopidogrel  75 mg Oral Q breakfast  . heparin  5,000 Units Subcutaneous Q8H  . insulin aspart  0-9 Units Subcutaneous Q4H  . levofloxacin  500 mg Oral Q48H  . lidocaine  1 patch Transdermal Q24H  . lisinopril  20 mg Oral QHS  . metoprolol tartrate  25 mg Oral BID  . multivitamin  1 tablet Oral QHS  . pantoprazole  40 mg Oral QHS  . polyethylene glycol  17 g Oral Daily  . rOPINIRole  0.5 mg Oral QHS

## 2017-03-21 NOTE — Progress Notes (Signed)
Physical Therapy Treatment Patient Details Name: Jon Anderson MRN: [id number] DOB: July 06, 1950 Today's Date: 03/21/2017    History of Present Illness pt is a 66 y/o male with pmh significant for CKD on HD, Chronic diastolic HF, recent CABGx1, HTN, DM, presnting to the ED with c/o central to left-sided chest pain.  Work up incl volume overload, probable HAP.    PT Comments    Pt feeling confident about being able to manage at home eventhough R LE pain worse today.   Follow Up Recommendations  Home health PT;Other (comment);Supervision/Assistance - 24 hour     Equipment Recommendations  None recommended by PT    Recommendations for Other Services       Precautions / Restrictions Precautions Precautions: Fall    Mobility  Bed Mobility Overal bed mobility: Modified Independent Bed Mobility: Supine to Sit;Sit to Supine              Transfers Overall transfer level: Needs assistance Equipment used: Straight cane Transfers: Sit to/from Stand Sit to Stand: Supervision         General transfer comment: used cane appropriately  Ambulation/Gait Ambulation/Gait assistance: Supervision Ambulation Distance (Feet): 170 Feet Assistive device: Straight cane Gait Pattern/deviations: Step-through pattern;Decreased stance time - right;Decreased stride length Gait velocity: slower   General Gait Details: stable, but more antalgic today.   Stairs         General stair comments: sats ata 93% on RA and EHR 101 bpm  Wheelchair Mobility    Modified Rankin (Stroke Patients Only)       Balance     Sitting balance-Leahy Scale: Good       Standing balance-Leahy Scale: Fair                              Cognition Arousal/Alertness: Awake/alert Behavior During Therapy: WFL for tasks assessed/performed Overall Cognitive Status: Within Functional Limits for tasks assessed                                        Exercises       General Comments        Pertinent Vitals/Pain Pain Assessment: Faces Faces Pain Scale: Hurts even more Pain Location: R LE Pain Descriptors / Indicators: Guarding Pain Intervention(s): Monitored during session;Limited activity within patient's tolerance    Home Living                      Prior Function            PT Goals (current goals can now be found in the care plan section) Acute Rehab PT Goals Patient Stated Goal: able to get to my appointment on the 21st PT Goal Formulation: With patient Time For Goal Achievement: 04/02/17 Potential to Achieve Goals: Good Progress towards PT goals: Progressing toward goals    Frequency    Min 3X/week      PT Plan Current plan remains appropriate    Co-evaluation              AM-PAC PT "6 Clicks" Daily Activity  Outcome Measure  Difficulty turning over in bed (including adjusting bedclothes, sheets and blankets)?: A Little Difficulty moving from lying on back to sitting on the side of the bed? : A Little Difficulty sitting down on and standing up from a chair with arms (  e.g., wheelchair, bedside commode, etc,.)?: A Little Help needed moving to and from a bed to chair (including a wheelchair)?: A Little Help needed walking in hospital room?: A Little Help needed climbing 3-5 steps with a railing? : A Little 6 Click Score: 18    End of Session   Activity Tolerance: Patient tolerated treatment well Patient left: with call bell/phone within reach;with chair alarm set;in bed Nurse Communication: Mobility status PT Visit Diagnosis: Other abnormalities of gait and mobility (R26.89);Unsteadiness on feet (R26.81);Pain Pain - Right/Left: Right Pain - part of body: Hip;Leg     Time: 9528-4132 PT Time Calculation (min) (ACUTE ONLY): 15 min  Charges:  $Gait Training: 8-22 mins                    G Codes:       April 02, 2017  Donnella Sham, PT [phone]  (pager)   Tessie Fass  Shelina Luo April 02, 2017, 5:20 PM

## 2017-03-21 NOTE — Progress Notes (Signed)
Patient ID: Jon Anderson, male   DOB: 1950/11/17, 66 y.o.   MRN: [id number]  PROGRESS NOTE    Jon Anderson  SWH:[id number] DOB: [birth year] DOA: 03/17/2017 PCP: [name], MD   Brief Narrative:  66 year old male with history of end-stage renal disease on dialysis, chronic diastolic heart failure, coronary artery disease status post recent CABG in October 2018 at [facility], hypertension, anemia of chronic disease, dyslipidemia and diabetes presented with chest pain and shortness of breath.  He was found to be hypertensive and was admitted with heart failure.  Nephrology and cardiology were consulted.   Assessment & Plan:   Principal Problem:   Chest pain Active Problems:   Precordial chest pain   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/[facility]   Anemia of chronic disease   Severe uncontrolled hypertension   ESRD (end stage renal disease) on dialysis (HCC)   Hyperlipidemia   Chronic chest pain   Type II diabetes mellitus (HCC)   Acute on chronic diastolic heart failure (HCC)   Acute heart failure (HCC)  Chest pain -Patient had severe chest pain this morning.  Cardiology following.  Repeat troponins ordered and are pending -Continue medical management including Ranexa, Imdur, aspirin, Plavix, statin, metoprolol, lisinopril and amlodipine -Echo on 03/18/2017 showed normal LV systolic function  Probable acute metabolic/toxic encephalopathy causing lethargy in a patient on multiple sedative medications -Patient was very drowsy on 03/18/2017.  Most of his sedative medications and pain medications including Requip were held at that time -Mental status much improved.  Continue monitoring mental status -CT of the brain on 03/18/2017 was unremarkable -Requip has already been restarted. Will probably have to restart some of his other pain meds probably due to chronic pain  Probable healthcare associated pneumonia, evolving on admission -Initially started on cefepime and vancomycin.  Currently on Levaquin.   We will switch to oral Augmentin for discharge planning  Acute on chronic diastolic heart failure -Cardiology following. Fluid being managed by dialysis.  Continue lisinopril, metoprolol, Imdur -Daily weights.  Strict input and output  End-stage renal disease on home dialysis -Nephrology following.  Coronary artery disease status post recent CABG -Continue aspirin, Plavix, statin, metoprolol, lisinopril, amlodipine  Hypertension -Monitor blood pressure.  Continue metoprolol, lisinopril, amlodipine and Imdur  Dyslipidemia -Continue Lipitor  Diabetes mellitus type 2 -Apparently he is off insulin pump recently.  Continue Accu-Cheks with coverage  Anemia of chronic disease from renal failure -Hemoglobin stable  Generalized deconditioning -Follow-up PT recommendations   DVT prophylaxis: Subcutaneous heparin Code Status: Full Family Communication: None at bedside Disposition Plan: Probable home in 1-2 days  Consultants: Cardiology and nephrology  Procedures: Echo on 03/18/2017 Study Conclusions  - Left ventricle: The cavity size was normal. Wall thickness was   increased in a pattern of moderate LVH. Systolic function was   normal. The estimated ejection fraction was in the range of 55%   to 60%. Wall motion was normal; there were no regional wall   motion abnormalities. Features are consistent with a pseudonormal   left ventricular filling pattern, with concomitant abnormal   relaxation and increased filling pressure (grade 2 diastolic   dysfunction). - Mitral valve: Severely calcified annulus. The findings are   consistent with mild stenosis. - Left atrium: The atrium was severely dilated.  Impressions:  - Normal LV systolic function; moderate diastolic dysfunction;   moderate LVH; severe MAC with mild MS (mean gradient 5 mmHg) and   trace MR; severe LAE; mild TR.   Antimicrobials: Cefepime 03/18/2017-03/19/2017 Vancomycin 03/18/2017  Levaquin on  03/20/2017   Subjective: Patient seen and examined at bedside undergoing dialysis.  He is awake and answers questions.  He had chest pain this morning but currently denies any chest pain.  No overnight fever or vomiting  Objective: Vitals:   03/21/17 0540 03/21/17 0547 03/21/17 0559 03/21/17 0804  BP: (!) 173/89 127/77 140/84 138/73  Pulse:    86  Resp:      Temp:      TempSrc:      SpO2:      Weight:      Height:        Intake/Output Summary (Last 24 hours) at 03/21/2017 1001 Last data filed at 03/20/2017 2100 Gross per 24 hour  Intake 480 ml  Output -  Net 480 ml   Filed Weights   03/19/17 1155 03/20/17 0446 03/21/17 0341  Weight: 69.2 kg (152 lb 8.9 oz) 71.8 kg (158 lb 4.8 oz) 71.2 kg (157 lb)    Examination:  General exam: Appears calm and comfortable.   Respiratory system: Bilateral decreased breath sound at bases with some scattered crackles Cardiovascular system: S1 & S2 heard, rate controlled  gastrointestinal system: Abdomen is nondistended, soft and nontender. Normal bowel sounds heard. Extremities: No cyanosis, clubbing; trace edema   Data Reviewed: I have personally reviewed following labs and imaging studies  CBC: Recent Labs  Lab 03/17/17 0944 03/17/17 1133 03/18/17 1523 03/19/17 0342 03/20/17 0819 03/21/17 0540  WBC 9.1  --  5.5 7.3 7.5 6.3  NEUTROABS  --   --  2.0 3.0  --   --   HGB 12.6* 15.0 12.2* 12.0* 13.1 12.5*  HCT 39.8 44.0 38.5* 38.8* 41.0 38.9*  MCV 93.0  --  93.7 93.7 92.1 90.9  PLT 208  --  185 205 201 144   Basic Metabolic Panel: Recent Labs  Lab 03/17/17 0944 03/17/17 1133 03/18/17 1523 03/19/17 0342 03/19/17 1541 03/20/17 0819 03/21/17 0540  NA 134* 138 135 135  --  134* 134*  K 3.2* 3.1* 3.6 3.7  --  4.0 3.9  CL 94* 95* 95* 95*  --  97* 96*  CO2 29  --  31 28  --  27 25  GLUCOSE 106* 87 83 71  --  93 86  BUN 12 13 12 18   --  17 27*  CREATININE 6.58* 6.30* 6.47* 7.57*  --  7.03* 8.83*  CALCIUM 10.5*  --  10.8*  11.3*  --  10.3 10.4*  MG  --   --  1.9 2.0  --   --   --   PHOS  --   --   --   --  3.1  --   --    GFR: Estimated Creatinine Clearance: 8.2 mL/min (A) (by C-G formula based on SCr of 8.83 mg/dL (H)). Liver Function Tests: Recent Labs  Lab 03/18/17 1523  AST 15  ALT 10*  ALKPHOS 61  BILITOT 0.6  PROT 6.4*  ALBUMIN 3.2*   No results for input(s): LIPASE, AMYLASE in the last 168 hours. No results for input(s): AMMONIA in the last 168 hours. Coagulation Profile: No results for input(s): INR, PROTIME in the last 168 hours. Cardiac Enzymes: Recent Labs  Lab 03/17/17 1523 03/17/17 1815 03/17/17 2116  TROPONINI 0.06* 0.07* 0.07*   BNP (last 3 results) No results for input(s): PROBNP in the last 8760 hours. HbA1C: No results for input(s): HGBA1C in the last 72 hours. CBG: Recent Labs  Lab 03/20/17 1658 03/20/17  2030 03/21/17 0008 03/21/17 0349 03/21/17 0745  GLUCAP 124* 103* 79 93 90   Lipid Profile: No results for input(s): CHOL, HDL, LDLCALC, TRIG, CHOLHDL, LDLDIRECT in the last 72 hours. Thyroid Function Tests: No results for input(s): TSH, T4TOTAL, FREET4, T3FREE, THYROIDAB in the last 72 hours. Anemia Panel: No results for input(s): VITAMINB12, FOLATE, FERRITIN, TIBC, IRON, RETICCTPCT in the last 72 hours. Sepsis Labs: No results for input(s): PROCALCITON, LATICACIDVEN in the last 168 hours.  Recent Results (from the past 240 hour(s))  Culture, blood (routine x 2)     Status: None (Preliminary result)   Collection Time: 03/18/17  4:54 PM  Result Value Ref Range Status   Specimen Description BLOOD BLOOD RIGHT ARM  Final   Special Requests IN PEDIATRIC BOTTLE Blood Culture adequate volume  Final   Culture NO GROWTH 2 DAYS  Final   Report Status PENDING  Incomplete  Culture, blood (routine x 2)     Status: None (Preliminary result)   Collection Time: 03/18/17  5:00 PM  Result Value Ref Range Status   Specimen Description BLOOD BLOOD RIGHT ARM  Final    Special Requests IN PEDIATRIC BOTTLE Blood Culture adequate volume  Final   Culture NO GROWTH 2 DAYS  Final   Report Status PENDING  Incomplete  MRSA PCR Screening     Status: None   Collection Time: 03/18/17  5:00 PM  Result Value Ref Range Status   MRSA by PCR NEGATIVE NEGATIVE Final    Comment:        The GeneXpert MRSA Assay (FDA approved for NASAL specimens only), is one component of a comprehensive MRSA colonization surveillance program. It is not intended to diagnose MRSA infection nor to guide or monitor treatment for MRSA infections.   Culture, Urine     Status: None   Collection Time: 03/18/17  5:00 PM  Result Value Ref Range Status   Specimen Description URINE, CLEAN CATCH  Final   Special Requests NONE  Final   Culture NO GROWTH  Final   Report Status 03/19/2017 FINAL  Final         Radiology Studies: No results found.      Scheduled Meds: . amLODipine  5 mg Oral QHS  . [START ON 03/22/2017] aspirin EC  81 mg Oral Daily  . atorvastatin  40 mg Oral q1800  . clopidogrel  75 mg Oral Q breakfast  . heparin  5,000 Units Subcutaneous Q8H  . insulin aspart  0-9 Units Subcutaneous Q4H  . isosorbide mononitrate  60 mg Oral Daily  . levofloxacin  500 mg Oral Q48H  . lidocaine  1 patch Transdermal Q24H  . lisinopril  20 mg Oral QHS  . metoprolol tartrate  25 mg Oral BID  . multivitamin  1 tablet Oral QHS  . pantoprazole  40 mg Oral QHS  . polyethylene glycol  17 g Oral Daily  . rOPINIRole  0.5 mg Oral QHS   Continuous Infusions:    LOS: 4 days        Aline August, MD [facility] Pager [phone]  If 7PM-7AM, please contact night-coverage [email or web address] Password [email or web address] 03/21/2017, 10:01 AM

## 2017-03-21 NOTE — Care Management Important Message (Signed)
Important Message  Patient Details  Name: Jon Anderson MRN: [id number] Date of Birth: January 20, 1951   Medicare Important Message Given:  Yes    [name] 03/21/2017, 2:33 PM

## 2017-03-21 NOTE — Progress Notes (Signed)
Shift event: RN paged because pt was having 10/10 chest pain. EKG done and appears similar to one done on the 8th. CP unrelieved by 2nd NTG. BP in the 120-140 range. No SOB or increased WOB. Chart reviewed. Pt came in with CP and flattened trops and cardio thought CP was demand secondary to acute illness. This is the first active CP in a couple of days. RN then gave 3rd NTG, 3 chewable 81mg  ASA, and 2mg  MSO4. When she checked on pt after that, he was sleeping. Cardio mentioned adding Imdur to his medical regimen. Will defer that to them.  Cycling troponins since none done in 4 days. Next 81mg  ASA due on the 13th. No further calls on pt after above.  KJKG, NP [facility]

## 2017-03-21 NOTE — Plan of Care (Signed)
VSS and WNL. No c/o pain or signs of distress

## 2017-03-22 LAB — CBC WITH DIFFERENTIAL/PLATELET
Basophils Absolute: 0 10*3/uL (ref 0.0–0.1)
Basophils Relative: 0 %
Eosinophils Absolute: 0.2 10*3/uL (ref 0.0–0.7)
Eosinophils Relative: 2 %
HCT: 42.2 % (ref 39.0–52.0)
Hemoglobin: 13.8 g/dL (ref 13.0–17.0)
Lymphocytes Relative: 37 %
Lymphs Abs: 2.5 10*3/uL (ref 0.7–4.0)
MCH: 29.7 pg (ref 26.0–34.0)
MCHC: 32.7 g/dL (ref 30.0–36.0)
MCV: 90.9 fL (ref 78.0–100.0)
Monocytes Absolute: 0.5 10*3/uL (ref 0.1–1.0)
Monocytes Relative: 7 %
Neutro Abs: 3.8 10*3/uL (ref 1.7–7.7)
Neutrophils Relative %: 54 %
Platelets: 233 10*3/uL (ref 150–400)
RBC: 4.64 MIL/uL (ref 4.22–5.81)
RDW: 15.7 % — ABNORMAL HIGH (ref 11.5–15.5)
WBC: 6.9 10*3/uL (ref 4.0–10.5)

## 2017-03-22 LAB — BASIC METABOLIC PANEL
Anion gap: 11 (ref 5–15)
BUN: 16 mg/dL (ref 6–20)
CO2: 31 mmol/L (ref 22–32)
Calcium: 10.3 mg/dL (ref 8.9–10.3)
Chloride: 91 mmol/L — ABNORMAL LOW (ref 101–111)
Creatinine, Ser: 6.4 mg/dL — ABNORMAL HIGH (ref 0.61–1.24)
GFR calc Af Amer: 9 mL/min — ABNORMAL LOW (ref >60)
GFR calc non Af Amer: 8 mL/min — ABNORMAL LOW (ref >60)
Glucose, Bld: 110 mg/dL — ABNORMAL HIGH (ref 65–99)
Potassium: 4.3 mmol/L (ref 3.5–5.1)
Sodium: 133 mmol/L — ABNORMAL LOW (ref 135–145)

## 2017-03-22 LAB — MAGNESIUM: Magnesium: 2.1 mg/dL (ref 1.7–2.4)

## 2017-03-22 LAB — TROPONIN I: Troponin I: 0.03 ng/mL (ref <0.03)

## 2017-03-22 LAB — GLUCOSE, CAPILLARY
Glucose-Capillary: 134 mg/dL — ABNORMAL HIGH (ref 65–99)
Glucose-Capillary: 75 mg/dL (ref 65–99)
Glucose-Capillary: 118 mg/dL — ABNORMAL HIGH (ref 65–99)

## 2017-03-22 MED ORDER — AMLODIPINE BESYLATE 5 MG PO TABS: 5.0000 mg | ORAL_TABLET | Freq: Every day | ORAL | 0 refills | Status: DC

## 2017-03-22 MED ORDER — ISOSORBIDE MONONITRATE ER 60 MG PO TB24: 60.0000 mg | ORAL_TABLET | Freq: Every day | ORAL | 0 refills | Status: DC

## 2017-03-22 MED ORDER — METOPROLOL TARTRATE 25 MG PO TABS: 25.0000 mg | ORAL_TABLET | Freq: Two times a day (BID) | ORAL | 0 refills | Status: DC

## 2017-03-22 MED ORDER — POLYETHYLENE GLYCOL 3350 17 G PO PACK: 17.0000 g | PACK | Freq: Every day | ORAL | 0 refills | Status: DC | PRN

## 2017-03-22 MED ORDER — AMOXICILLIN-POT CLAVULANATE 500-125 MG PO TABS: 500.0000 mg | ORAL_TABLET | Freq: Every day | ORAL | Status: DC

## 2017-03-22 MED ORDER — AMOXICILLIN-POT CLAVULANATE 500-125 MG PO TABS: 500.0000 mg | ORAL_TABLET | Freq: Every day | ORAL | 0 refills | Status: AC

## 2017-03-22 NOTE — Progress Notes (Signed)
Progress Note  Patient Name: Jon Anderson Date of Encounter: 03/22/2017  Primary Cardiologist:    [facility]   Subjective   No further chest pain.  No SOB.  Inpatient Medications    Scheduled Meds: . amLODipine  5 mg Oral QHS  . [START ON 03/23/2017] amoxicillin-clavulanate  500 mg Oral QHS  . aspirin EC  81 mg Oral Daily  . atorvastatin  40 mg Oral q1800  . clopidogrel  75 mg Oral Q breakfast  . heparin  5,000 Units Subcutaneous Q8H  . insulin aspart  0-9 Units Subcutaneous Q4H  . isosorbide mononitrate  60 mg Oral Daily  . lidocaine  1 patch Transdermal Q24H  . lisinopril  20 mg Oral QHS  . metoprolol tartrate  25 mg Oral BID  . multivitamin  1 tablet Oral QHS  . pantoprazole  40 mg Oral QHS  . polyethylene glycol  17 g Oral Daily  . rOPINIRole  0.5 mg Oral QHS   Continuous Infusions:  PRN Meds: acetaminophen, hydrALAZINE, hydrALAZINE, ondansetron (ZOFRAN) IV, senna   Vital Signs    Vitals:   03/21/17 1250 03/21/17 2105 03/22/17 0500 03/22/17 0851  BP: (!) 150/81 107/61 131/74 121/71  Pulse: 72 89 80 88  Resp: (!) 21     Temp: (!) 97.5 F (36.4 C) 98 F (36.7 C) 97.7 F (36.5 C)   TempSrc: Oral Oral Oral   SpO2: 96% 100% 100%   Weight: 150 lb 9.2 oz (68.3 kg)  150 lb 8 oz (68.3 kg)   Height:        Intake/Output Summary (Last 24 hours) at 03/22/2017 1211 Last data filed at 03/22/2017 0900 Gross per 24 hour  Intake 702 ml  Output 2315 ml  Net -1613 ml   Filed Weights   03/21/17 0822 03/21/17 1250 03/22/17 0500  Weight: 157 lb 3 oz (71.3 kg) 150 lb 9.2 oz (68.3 kg) 150 lb 8 oz (68.3 kg)    Telemetry    NSR - Personally Reviewed  ECG    NSR, repolarization changes.  03/22/2017  - Personally Reviewed  Physical Exam   GEN: No  acute distress.   Neck: No  JVD Cardiac: RRR, no murmurs, rubs, or gallops.  Respiratory: Clear   to auscultation bilaterally. GI: Soft, nontender, non-distended, normal bowel sounds  MS:  No edema; No  deformity. Neuro:   Nonfocal  Psych: Oriented and appropriate    Labs    Chemistry Recent Labs  Lab 03/18/17 1523  03/20/17 0819 03/21/17 0540 03/22/17 0023  NA 135   < > 134* 134* 133*  K 3.6   < > 4.0 3.9 4.3  CL 95*   < > 97* 96* 91*  CO2 31   < > 27 25 31   GLUCOSE 83   < > 93 86 110*  BUN 12   < > 17 27* 16  CREATININE 6.47*   < > 7.03* 8.83* 6.40*  CALCIUM 10.8*   < > 10.3 10.4* 10.3  PROT 6.4*  --   --   --   --   ALBUMIN 3.2*  --   --   --   --   AST 15  --   --   --   --   ALT 10*  --   --   --   --   ALKPHOS 61  --   --   --   --   BILITOT 0.6  --   --   --   --  GFRNONAA 8*   < > 7* 5* 8*  GFRAA 9*   < > 8* 6* 9*  ANIONGAP 9   < > 10 13 11    < > = values in this interval not displayed.     Hematology Recent Labs  Lab 03/20/17 0819 03/21/17 0540 03/22/17 0023  WBC 7.5 6.3 6.9  RBC 4.45 4.28 4.64  HGB 13.1 12.5* 13.8  HCT 41.0 38.9* 42.2  MCV 92.1 90.9 90.9  MCH 29.4 29.2 29.7  MCHC 32.0 32.1 32.7  RDW 15.8* 15.5 15.7*  PLT 201 211 233    Cardiac Enzymes Recent Labs  Lab 03/17/17 2116 03/21/17 0557 03/21/17 1810 03/22/17 0023  TROPONINI 0.07* 0.04* <0.03 0.03*    Recent Labs  Lab 03/17/17 0942  TROPIPOC 0.04     BNP Recent Labs  Lab 03/17/17 1124  BNP 3,394.7*     DDimer No results for input(s): DDIMER in the last 168 hours.   Radiology    No results found.  Cardiac Studies   ECHO: 03/18/2017 - Left ventricle: The cavity size was normal. Wall thickness was increased in a pattern of moderate LVH. Systolic function was normal. The estimated ejection fraction was in the range of 55% to 60%. Wall motion was normal; there were no regional wall motion abnormalities. Features are consistent with a pseudonormal left ventricular filling pattern, with concomitant abnormal relaxation and increased filling pressure (grade 2 diastolicdysfunction). - Mitral valve: Severely calcified annulus. The findings  are consistent with mild stenosis. - Left atrium: The atrium was severely dilated. Impressions: - Normal LV systolic function; moderate diastolic dysfunction; moderate LVH; severe MAC with mild MS (mean gradient 5 mmHg) and trace MR; severe LAE; mild TR.   Patient Profile     66 y.o. male with hx of CAD s/p CABG LIMA to LAD at [facility] 01/2017, ESRD on HD, HTN, HLD, DM, chronic diastolic CHF and anemia presented with chest pain and shortness of breath. Found to be hypertensive and acute CHF.    Assessment & Plan    CHEST PAIN:   Troponins trended yesterday were non significantly mildly elevated.  Pain was atypical.  No further work up.  OK to discharge from our perspective.  He will follow with cardiologist at [facility].    ACUTE ON CHRONIC DIASTOLIC HF:  Volume managed per dialysis  HTN:  BP OK  HLD:  Continue current meds.    ESRD:  Per renal/dialysis.  For questions or updates, please contact [place] Please consult [email or web address] for contact info under Cardiology/STEMI.   Signed, Minus Breeding, MD  03/22/2017, 12:11 PM

## 2017-03-22 NOTE — Progress Notes (Signed)
Discharge instruction was given to pt.  Belongings were sent home with pt.  Idolina Primer, RN

## 2017-03-22 NOTE — Discharge Summary (Signed)
Physician Discharge Summary  Jon Anderson WER:[id number] DOB: 1950-07-18 DOA: 03/17/2017  PCP: [name], MD  Admit date: 03/17/2017 Discharge date: 03/22/2017  Admitted From: Home Disposition:  Home  Recommendations for Outpatient Follow-up:  1. Follow up with PCP in 1week 2. Follow-up with cardiology as an outpatient in 1-2 weeks 3. Follow-up with outpatient dialysis as scheduled   Home Health: Yes Equipment/Devices: None Discharge Condition: Stable CODE STATUS: Full Diet recommendation: Heart Healthy / Carb Modified /renal hemodialysis diet  Brief/Interim Summary: 66 year old male with history of end-stage renal disease on dialysis, chronic diastolic heart failure, coronary artery disease status post recent CABG in October 2018 at [facility], hypertension, anemia of chronic disease, dyslipidemia and diabetes presented with chest pain and shortness of breath.  He was found to be hypertensive and was admitted with heart failure.  Nephrology and cardiology were consulted.  Patient also had altered mental status sedative medications were held.  Chest x-ray showed probable pneumonia for which she was started on broad-spectrum antibiotics switched to oral antibiotics.  Cultures have been negative.  She had intermittent chest pains which have improved after medication changes by cardiology. Cardiology has cleared the patient for discharge.     Discharge Diagnoses:  Principal Problem:   Chest pain Active Problems:   Precordial chest pain   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/[facility]   Anemia of chronic disease   Severe uncontrolled hypertension   ESRD (end stage renal disease) on dialysis (HCC)   Hyperlipidemia   Chronic chest pain   Type II diabetes mellitus (HCC)   Acute on chronic diastolic heart failure (HCC)   Acute heart failure (HCC)  Chest pain -Improved.  Cardiology following.  Repeat troponins did not increase. -Continue medical management including  Imdur, aspirin, Plavix,  statin, metoprolol, lisinopril and amlodipine -Echo on 03/18/2017 showed normal LV systolic function - Cardiology has cleared the patient for discharge. Outpatient followup with cardiology at [facility] health.  Probable acute metabolic/toxic encephalopathy causing lethargy in a patient on multiple sedative medications -Patient was very drowsy on 03/18/2017.  Most of his sedative medications and pain medications including Requip were held at that time -Mental status much improved.   - Requip was restarted. -CT of the brain on 03/18/2017 was unremarkable -Outpatient followup with PCP regarding chronic pain.  Probable healthcare associated pneumonia, evolving on admission -Initially started on cefepime and vancomycin.  Cultures negative. Switched to  Levaquin and then Augmentin. Continue Augmentin for 2 more days on discharge.    Acute on chronic diastolic heart failure -Cardiology following. Fluid being managed by dialysis.  Continue lisinopril, metoprolol, Imdur -Outpatient followup with cardiology.  End-stage renal disease on home dialysis -Nephrology following.  - Outpatient follow up with dialysis as scheduled.  Coronary artery disease status post recent CABG -Continue aspirin, Plavix, statin, metoprolol, lisinopril, amlodipine. Outpatient followup  Hypertension -Monitor blood pressure.  Continue metoprolol, lisinopril, amlodipine and Imdur  Dyslipidemia -Continue Lipitor  Diabetes mellitus type 2 -Apparently off insulin pump recently. Outpatient followup.  Anemia of chronic disease from renal failure -Hemoglobin stable. Outpatient followup  Generalized deconditioning -Outpatient PT followup   Discharge Instructions  Discharge Instructions    Call MD for:  difficulty breathing, headache or visual disturbances   Complete by:  As directed    Call MD for:  extreme fatigue   Complete by:  As directed    Call MD for:  hives   Complete by:  As directed     Call MD for:  persistant dizziness  or light-headedness   Complete by:  As directed    Call MD for:  persistant nausea and vomiting   Complete by:  As directed    Call MD for:  severe uncontrolled pain   Complete by:  As directed    Call MD for:  temperature >100.4   Complete by:  As directed    Diet - low sodium heart healthy   Complete by:  As directed    Discharge instructions   Complete by:  As directed    Carb modified/Renal hemodialysis diet   Increase activity slowly   Complete by:  As directed      Allergies as of 03/22/2017      Reactions   Kiwi Extract Swelling   Facial swelling    Tape Other (See Comments)   Plastic tape-blistering      Medication List    STOP taking these medications   cyclobenzaprine 10 MG tablet Commonly known as:  FLEXERIL   naproxen 500 MG tablet Commonly known as:  NAPROSYN   NOVOLOG 100 UNIT/ML injection Generic drug:  insulin aspart   ondansetron 4 MG tablet Commonly known as:  ZOFRAN   oxyCODONE-acetaminophen 5-325 MG tablet Commonly known as:  PERCOCET/ROXICET     TAKE these medications   amLODipine 5 MG tablet Commonly known as:  NORVASC Take 1 tablet (5 mg total) by mouth at bedtime.   amoxicillin-clavulanate 500-125 MG tablet Commonly known as:  AUGMENTIN Take 1 tablet (500 mg total) by mouth at bedtime for 2 days. Start taking on:  03/23/2017   aspirin EC 81 MG tablet Take 1 tablet (81 mg total) by mouth daily.   atorvastatin 40 MG tablet Commonly known as:  LIPITOR Take 1 tablet (40 mg total) by mouth daily at 6 PM.   calcium acetate 667 MG capsule Commonly known as:  PHOSLO Take 3 capsules (2,001 mg total) by mouth 3 (three) times daily with meals. What changed:    how much to take  when to take this  additional instructions   clopidogrel 75 MG tablet Commonly known as:  PLAVIX Take 1 tablet (75 mg total) by mouth daily with breakfast.   gabapentin 300 MG capsule Commonly known as:   NEURONTIN Take 300 mg by mouth 2 (two) times daily.   HYDROcodone-acetaminophen 5-325 MG tablet Commonly known as:  NORCO Take 1-2 tablets by mouth daily as needed. What changed:    how much to take  when to take this  reasons to take this  Another medication with the same name was removed. Continue taking this medication, and follow the directions you see here.   isosorbide mononitrate 60 MG 24 hr tablet Commonly known as:  IMDUR Take 1 tablet (60 mg total) by mouth daily. Start taking on:  03/23/2017   lisinopril 20 MG tablet Commonly known as:  PRINIVIL,ZESTRIL Take 20 mg by mouth at bedtime.   metoprolol tartrate 25 MG tablet Commonly known as:  LOPRESSOR Take 1 tablet (25 mg total) by mouth 2 (two) times daily. What changed:  how much to take   multivitamin Tabs tablet Take 1 tablet by mouth at bedtime.   nitroGLYCERIN 0.4 MG SL tablet Commonly known as:  NITROSTAT Place 1 tablet (0.4 mg total) under the tongue every 5 (five) minutes as needed for chest pain.   pantoprazole 40 MG tablet Commonly known as:  PROTONIX Take 40 mg by mouth daily.   polyethylene glycol packet Commonly known as:  MIRALAX / GLYCOLAX Take  17 g by mouth daily as needed for moderate constipation.   rOPINIRole 0.5 MG tablet Commonly known as:  REQUIP Take 0.5 mg by mouth See admin instructions. Pt takes every night at bedtime and three days per week at dialysis   tamsulosin 0.4 MG Caps capsule Commonly known as:  FLOMAX Take 0.4 mg by mouth daily.   traMADol 50 MG tablet Commonly known as:  ULTRAM Take 1 tablet (50 mg total) every 12 (twelve) hours as needed by mouth. What changed:    when to take this  reasons to take this       Follow-up Information    [facility] Follow up.   Specialty:  Home Health Services Why:  Clay will call  to arrange initial visit Contact information: [address] [place]  [address] [phone]          Allergies  Allergen Reactions  . Kiwi Extract Swelling    Facial swelling   . Tape Other (See Comments)    Plastic tape-blistering    Consultations:  Cardiology and nephrology   Procedures/Studies: Dg Chest 2 View  Result Date: 03/19/2017 CLINICAL DATA:  66 year old male with a history of shortness of breath and weakness EXAM: CHEST  2 VIEW COMPARISON:  03/18/2017, 03/17/2017 FINDINGS: Cardiomediastinal silhouette unchanged in size and contour. Calcifications of the aortic arch. Coarsened interstitial markings, similar to prior. No significant interlobular septal thickening. Lateral view demonstrates meniscus, corresponding to opacity at the right lung base and blunting of the left costophrenic angle. IMPRESSION: Overall, aeration is improved with small bilateral pleural effusions and associated atelectasis/ consolidation. Coarsened interstitial markings may reflect mild pulmonary edema and/ or atypical infection. Electronically Signed   By: Corrie Mckusick D.O.   On: 03/19/2017 08:16   Dg Chest 2 View  Result Date: 03/17/2017 CLINICAL DATA:  Nausea vomiting earlier today. EXAM: CHEST  2 VIEW COMPARISON:  11/29/2016 FINDINGS: Cardiac silhouette is normal in size. No mediastinal or hilar masses. No evidence of adenopathy. Mild interstitial thickening is noted most evident in the lower lungs, including thickening of the fissures. No lung consolidation to suggest pneumonia. No pleural effusion. No pneumothorax. Skeletal structures are intact. IMPRESSION: 1. Mild interstitial thickening, which appears mildly increased from prior exams. If there shortness of breath, consider mild interstitial pulmonary edema. 2. No convincing pneumonia. Electronically Signed   By: Lajean Manes M.D.   On: 03/17/2017 10:00   Ct Head Wo Contrast  Result Date: 03/18/2017 CLINICAL DATA:  Altered mental status with lethargy EXAM: CT HEAD WITHOUT CONTRAST TECHNIQUE: Contiguous axial  images were obtained from the base of the skull through the vertex without intravenous contrast. COMPARISON:  None. FINDINGS: Brain: There is mild diffuse atrophy. There is no intracranial mass, hemorrhage, extra-axial fluid collection, or midline shift. There is patchy small vessel disease in the centra semiovale bilaterally. Elsewhere gray-white compartments appear normal. No acute infarct is demonstrable. Vascular: There is no appreciable hyperdense vessel. There is calcification in each carotid siphon region. There is also calcification in each distal vertebral artery. Skull: The bony calvarium appears intact. Sinuses/Orbits: There is mucosal thickening in several ethmoid air cells bilaterally. Visualized paranasal sinuses otherwise clear. Orbits appear symmetric bilaterally. Other: Mastoid air cells are clear. IMPRESSION: Atrophy with periventricular small vessel disease. No intracranial mass, hemorrhage, or evidence of acute infarct. There are foci of arterial vascular calcification. There is mucosal thickening in several ethmoid air cells bilaterally. Electronically Signed   By: Gwyndolyn Saxon  Jasmine December III M.D.   On: 03/18/2017 14:45   Dg Chest Port 1 View  Result Date: 03/18/2017 CLINICAL DATA:  Shortness of Breath EXAM: PORTABLE CHEST 1 VIEW COMPARISON:  March 17, 2017 FINDINGS: There is airspace consolidation in the left lower lobe with small left pleural effusion. There is atelectatic change in the right base. Lungs elsewhere clear. Heart is upper normal in size with pulmonary vascularity within normal limits. No adenopathy. There is aortic atherosclerosis. No evident bone lesions. IMPRESSION: Airspace consolidation left lower lobe with small left pleural effusion. Airspace consolidation left base has increased significantly compared to 1 day prior. Patchy atelectasis remains right base. Stable cardiac silhouette. There is aortic atherosclerosis. Aortic Atherosclerosis (ICD10-I70.0). Electronically Signed    By: Lowella Grip III M.D.   On: 03/18/2017 13:57    Echo on 03/18/2017 Study Conclusions  - Left ventricle: The cavity size was normal. Wall thickness was increased in a pattern of moderate LVH. Systolic function was normal. The estimated ejection fraction was in the range of 55% to 60%. Wall motion was normal; there were no regional wall motion abnormalities. Features are consistent with a pseudonormal left ventricular filling pattern, with concomitant abnormal relaxation and increased filling pressure (grade 2 diastolic dysfunction). - Mitral valve: Severely calcified annulus. The findings are consistent with mild stenosis. - Left atrium: The atrium was severely dilated.  Impressions:  - Normal LV systolic function; moderate diastolic dysfunction; moderate LVH; severe MAC with mild MS (mean gradient 5 mmHg) and trace MR; severe LAE; mild TR.       Subjective: Patient seen and examined at bedside.  He denies any current chest pain. no overnight fever or vomiting.  Discharge Exam: Vitals:   03/22/17 0500 03/22/17 0851  BP: 131/74 121/71  Pulse: 80 88  Resp:    Temp: 97.7 F (36.5 C)   SpO2: 100%    Vitals:   03/21/17 1250 03/21/17 2105 03/22/17 0500 03/22/17 0851  BP: (!) 150/81 107/61 131/74 121/71  Pulse: 72 89 80 88  Resp: (!) 21     Temp: (!) 97.5 F (36.4 C) 98 F (36.7 C) 97.7 F (36.5 C)   TempSrc: Oral Oral Oral   SpO2: 96% 100% 100%   Weight: 68.3 kg (150 lb 9.2 oz)  68.3 kg (150 lb 8 oz)   Height:        General: Pt is alert, awake, not in acute distress Cardiovascular: rate controlled, S1/S2 + Respiratory: Bilateral decreased breath sounds at bases Abdominal: Soft, NT, ND, bowel sounds + Extremities: no edema, no cyanosis    The results of significant diagnostics from this hospitalization (including imaging, microbiology, ancillary and laboratory) are listed below for reference.     Microbiology: Recent  Results (from the past 240 hour(s))  Culture, blood (routine x 2)     Status: None (Preliminary result)   Collection Time: 03/18/17  4:54 PM  Result Value Ref Range Status   Specimen Description BLOOD BLOOD RIGHT ARM  Final   Special Requests IN PEDIATRIC BOTTLE Blood Culture adequate volume  Final   Culture NO GROWTH 3 DAYS  Final   Report Status PENDING  Incomplete  Culture, blood (routine x 2)     Status: None (Preliminary result)   Collection Time: 03/18/17  5:00 PM  Result Value Ref Range Status   Specimen Description BLOOD BLOOD RIGHT ARM  Final   Special Requests IN PEDIATRIC BOTTLE Blood Culture adequate volume  Final   Culture NO GROWTH 3 DAYS  Final   Report Status PENDING  Incomplete  MRSA PCR Screening     Status: None   Collection Time: 03/18/17  5:00 PM  Result Value Ref Range Status   MRSA by PCR NEGATIVE NEGATIVE Final    Comment:        The GeneXpert MRSA Assay (FDA approved for NASAL specimens only), is one component of a comprehensive MRSA colonization surveillance program. It is not intended to diagnose MRSA infection nor to guide or monitor treatment for MRSA infections.   Culture, Urine     Status: None   Collection Time: 03/18/17  5:00 PM  Result Value Ref Range Status   Specimen Description URINE, CLEAN CATCH  Final   Special Requests NONE  Final   Culture NO GROWTH  Final   Report Status 03/19/2017 FINAL  Final     Labs: BNP (last 3 results) Recent Labs    03/17/17 1124  BNP 4,128.7*   Basic Metabolic Panel: Recent Labs  Lab 03/18/17 1523 03/19/17 0342 03/19/17 1541 03/20/17 0819 03/21/17 0540 03/22/17 0023  NA 135 135  --  134* 134* 133*  K 3.6 3.7  --  4.0 3.9 4.3  CL 95* 95*  --  97* 96* 91*  CO2 31 28  --  _0 GLUCOSE 83 71  --  93 86 110*  BUN 12 18  --  17 27* 16  CREATININE 6.47* 7.57*  --  7.03* 8.83* 6.40*  CALCIUM 10.8* 11.3*  --  10.3 10.4* 10.3  MG 1.9 2.0  --   --   --  2.1  PHOS  --   --  3.1  --   --   --     Liver Function Tests: Recent Labs  Lab 03/18/17 1523  AST 15  ALT 10*  ALKPHOS 61  BILITOT 0.6  PROT 6.4*  ALBUMIN 3.2*   No results for input(s): LIPASE, AMYLASE in the last 168 hours. No results for input(s): AMMONIA in the last 168 hours. CBC: Recent Labs  Lab 03/18/17 1523 03/19/17 0342 03/20/17 0819 03/21/17 0540 03/22/17 0023  WBC 5.5 7.3 7.5 6.3 6.9  NEUTROABS 2.0 3.0  --   --  3.8  HGB 12.2* 12.0* 13.1 12.5* 13.8  HCT 38.5* 38.8* 41.0 38.9* 42.2  MCV 93.7 93.7 92.1 90.9 90.9  PLT 185 205 201 211 233   Cardiac Enzymes: Recent Labs  Lab 03/17/17 1815 03/17/17 2116 03/21/17 0557 03/21/17 1810 03/22/17 0023  TROPONINI 0.07* 0.07* 0.04* <0.03 0.03*   BNP: Invalid input(s): POCBNP CBG: Recent Labs  Lab 03/21/17 1957 03/21/17 2347 03/22/17 0457 03/22/17 0742 03/22/17 1147  GLUCAP 122* 111* 134* 75 118*   D-Dimer No results for input(s): DDIMER in the last 72 hours. Hgb A1c No results for input(s): HGBA1C in the last 72 hours. Lipid Profile No results for input(s): CHOL, HDL, LDLCALC, TRIG, CHOLHDL, LDLDIRECT in the last 72 hours. Thyroid function studies No results for input(s): TSH, T4TOTAL, T3FREE, THYROIDAB in the last 72 hours.  Invalid input(s): FREET3 Anemia work up No results for input(s): VITAMINB12, FOLATE, FERRITIN, TIBC, IRON, RETICCTPCT in the last 72 hours. Urinalysis    Component Value Date/Time   COLORURINE YELLOW 03/18/2017 1721   APPEARANCEUR CLEAR 03/18/2017 1721   LABSPEC 1.007 03/18/2017 1721   PHURINE 9.0 (H) 03/18/2017 1721   GLUCOSEU 150 (A) 03/18/2017 1721   HGBUR NEGATIVE 03/18/2017 1721   BILIRUBINUR NEGATIVE 03/18/2017 1721   KETONESUR NEGATIVE 03/18/2017 1721  PROTEINUR >=300 (A) 03/18/2017 1721   UROBILINOGEN 0.2 06/18/2014 2308   NITRITE NEGATIVE 03/18/2017 1721   LEUKOCYTESUR NEGATIVE 03/18/2017 1721   Sepsis Labs Invalid input(s): PROCALCITONIN,  WBC,  LACTICIDVEN Microbiology Recent Results (from  the past 240 hour(s))  Culture, blood (routine x 2)     Status: None (Preliminary result)   Collection Time: 03/18/17  4:54 PM  Result Value Ref Range Status   Specimen Description BLOOD BLOOD RIGHT ARM  Final   Special Requests IN PEDIATRIC BOTTLE Blood Culture adequate volume  Final   Culture NO GROWTH 3 DAYS  Final   Report Status PENDING  Incomplete  Culture, blood (routine x 2)     Status: None (Preliminary result)   Collection Time: 03/18/17  5:00 PM  Result Value Ref Range Status   Specimen Description BLOOD BLOOD RIGHT ARM  Final   Special Requests IN PEDIATRIC BOTTLE Blood Culture adequate volume  Final   Culture NO GROWTH 3 DAYS  Final   Report Status PENDING  Incomplete  MRSA PCR Screening     Status: None   Collection Time: 03/18/17  5:00 PM  Result Value Ref Range Status   MRSA by PCR NEGATIVE NEGATIVE Final    Comment:        The GeneXpert MRSA Assay (FDA approved for NASAL specimens only), is one component of a comprehensive MRSA colonization surveillance program. It is not intended to diagnose MRSA infection nor to guide or monitor treatment for MRSA infections.   Culture, Urine     Status: None   Collection Time: 03/18/17  5:00 PM  Result Value Ref Range Status   Specimen Description URINE, CLEAN CATCH  Final   Special Requests NONE  Final   Culture NO GROWTH  Final   Report Status 03/19/2017 FINAL  Final     Time coordinating discharge: 35 minutes  SIGNED:   Aline August, MD  [facility] 03/22/2017, 12:13 PM Pager: [phone]  If 7PM-7AM, please contact night-coverage [email or web address] Password [email or web address]

## 2017-03-22 NOTE — Progress Notes (Signed)
[facility] Progress Note  Dialysis Orders: [facility] MWF 4h 75.5kg 2K/2Ca Profile 2 L AVF Heparin 5000 Venofer 50mg  IV q week  Calcitriol 2.40mcg PO q HD  Assessment/Plan: 1. Dyspnea/pulmedema -lowering EDW net UF 3 L 12/10 with post wt 69.2 - standing; try for 3 L today to further lower volume - EDW down significantly;  68.3 post HD Wed.  2.  HCAP - reduce Augmentin to 500 mg per day due to ESRD 3. Lethargy12/9- getting numerous sedating meds-resolved 4. Chest pain/CAD/recentCABG 01/2017 -percards - trop flat EF 55 - 60%, no pericardial effusion, severe LAD; noted to have recurrent episodes of CP  Cardiology added long acting nitrate- no plans for cath 5. ESRD -usual HD is MWF- K 4.3  HD first round Friday if not d/c today; run 3 hr treatment due to high patient volume 6. HTN - BP better  on norvasc/ acei/ BB was ^- continue to lower edw 7. Anemia - Hgb 13.8  No ESA needs currently - hold Fe for d/c due to ^ hgb  8. Metabolic bone disease -Ca 11.3 12/10not clear why hypercalcemicHoldingVit D/Ca acetate with Jamas Lav,. Readdress a outpatient HD unit after d/c - may need sensipar; changed to non Ca based binder; Ca 10.4 12/12 Ca 10.3 12/13 9. Nutrition -Renal diet/vitamins 10. Lumbar radiculopathy - L5-S1, L4-5 and L3-4 per MRI 01/2017 herniation affecting Ride sided sacral nerve roots in the thecal sac, left L4 and right L3 nerve - followed by NS 11. Dispo - per primary, stable for d/c from renal standpoint   Myriam Jacobson, PA-C [place] [phone] 03/22/2017,8:59 AM  LOS: 5 days   Pt seen, examined and agree w A/P as above.  Kelly Splinter MD [facility] pager [phone]   03/22/2017, 12:22 PM    Subjective:   No more CP  Objective Vitals:   03/21/17 1250 03/21/17 2105 03/22/17 0500 03/22/17 0851  BP: (!) 150/81 107/61 131/74 121/71  Pulse: 72 89 80 88  Resp: (!) 21     Temp: (!) 97.5 F  (36.4 C) 98 F (36.7 C) 97.7 F (36.5 C)   TempSrc: Oral Oral Oral   SpO2: 96% 100% 100%   Weight: 68.3 kg (150 lb 9.2 oz)  68.3 kg (150 lb 8 oz)   Height:       Physical Exam General: NAD breathing easily on room air Heart:RRR Lungs: no rales Abdomen: soft NT Extremities: no LE edema Dialysis Access:  Left upper AVF + bruit   Additional Objective Labs: Basic Metabolic Panel: Recent Labs  Lab 03/19/17 1541 03/20/17 0819 03/21/17 0540 03/22/17 0023  NA  --  134* 134* 133*  K  --  4.0 3.9 4.3  CL  --  97* 96* 91*  CO2  --  27 25 31   GLUCOSE  --  93 86 110*  BUN  --  17 27* 16  CREATININE  --  7.03* 8.83* 6.40*  CALCIUM  --  10.3 10.4* 10.3  PHOS 3.1  --   --   --    Liver Function Tests: Recent Labs  Lab 03/18/17 1523  AST 15  ALT 10*  ALKPHOS 61  BILITOT 0.6  PROT 6.4*  ALBUMIN 3.2*   No results for input(s): LIPASE, AMYLASE in the last 168 hours. CBC: Recent Labs  Lab 03/18/17 1523 03/19/17 0342 03/20/17 0819 03/21/17 0540 03/22/17 0023  WBC 5.5 7.3 7.5 6.3 6.9  NEUTROABS 2.0 3.0  --   --  3.8  HGB 12.2* 12.0* 13.1 12.5* 13.8  HCT 38.5* 38.8* 41.0 38.9* 42.2  MCV 93.7 93.7 92.1 90.9 90.9  PLT 185 205 201 211 233   Blood Culture    Component Value Date/Time   SDES BLOOD BLOOD RIGHT ARM 03/18/2017 1700   SDES URINE, CLEAN CATCH 03/18/2017 1700   SPECREQUEST IN PEDIATRIC BOTTLE Blood Culture adequate volume 03/18/2017 1700   SPECREQUEST NONE 03/18/2017 1700   CULT NO GROWTH 3 DAYS 03/18/2017 1700   CULT NO GROWTH 03/18/2017 1700   REPTSTATUS PENDING 03/18/2017 1700   REPTSTATUS 03/19/2017 FINAL 03/18/2017 1700    Cardiac Enzymes: Recent Labs  Lab 03/17/17 1815 03/17/17 2116 03/21/17 0557 03/21/17 1810 03/22/17 0023  TROPONINI 0.07* 0.07* 0.04* <0.03 0.03*   CBG: Recent Labs  Lab 03/21/17 1602 03/21/17 1957 03/21/17 2347 03/22/17 0457 03/22/17 0742  GLUCAP 166* 122* 111* 134* 75   Iron Studies: No results for input(s): IRON,  TIBC, TRANSFERRIN, FERRITIN in the last 72 hours. Lab Results  Component Value Date   INR 1.01 06/22/2014   INR 1.09 03/25/2014   INR 1.01 12/05/2013   Studies/Results: No results found. Medications:  . amLODipine  5 mg Oral QHS  . amoxicillin-clavulanate  500 mg Oral BID  . aspirin EC  81 mg Oral Daily  . atorvastatin  40 mg Oral q1800  . clopidogrel  75 mg Oral Q breakfast  . heparin  5,000 Units Subcutaneous Q8H  . insulin aspart  0-9 Units Subcutaneous Q4H  . isosorbide mononitrate  60 mg Oral Daily  . lidocaine  1 patch Transdermal Q24H  . lisinopril  20 mg Oral QHS  . metoprolol tartrate  25 mg Oral BID  . multivitamin  1 tablet Oral QHS  . pantoprazole  40 mg Oral QHS  . polyethylene glycol  17 g Oral Daily  . rOPINIRole  0.5 mg Oral QHS

## 2017-03-23 ENCOUNTER — Other Ambulatory Visit: Payer: Self-pay

## 2017-03-23 ENCOUNTER — Emergency Department (HOSPITAL_COMMUNITY)
Admission: EM | Admit: 2017-03-23 | Discharge: 2017-03-23 | Disposition: A | Discharge status: Home / Self Care | Payer: Medicare Other | Attending: Emergency Medicine | Admitting: Emergency Medicine

## 2017-03-23 ENCOUNTER — Emergency Department (HOSPITAL_COMMUNITY): Payer: Medicare Other

## 2017-03-23 DIAGNOSIS — I132 Hypertensive heart and chronic kidney disease with heart failure and with stage 5 chronic kidney disease, or end stage renal disease: Secondary | ICD-10-CM | POA: Diagnosis not present

## 2017-03-23 DIAGNOSIS — Z79899 Other long term (current) drug therapy: Secondary | ICD-10-CM | POA: Diagnosis not present

## 2017-03-23 DIAGNOSIS — E785 Hyperlipidemia, unspecified: Secondary | ICD-10-CM | POA: Diagnosis not present

## 2017-03-23 DIAGNOSIS — Z992 Dependence on renal dialysis: Secondary | ICD-10-CM | POA: Insufficient documentation

## 2017-03-23 DIAGNOSIS — R079 Chest pain, unspecified: Secondary | ICD-10-CM | POA: Insufficient documentation

## 2017-03-23 DIAGNOSIS — Z7982 Long term (current) use of aspirin: Secondary | ICD-10-CM | POA: Insufficient documentation

## 2017-03-23 DIAGNOSIS — E119 Type 2 diabetes mellitus without complications: Secondary | ICD-10-CM | POA: Diagnosis not present

## 2017-03-23 DIAGNOSIS — I5032 Chronic diastolic (congestive) heart failure: Secondary | ICD-10-CM | POA: Insufficient documentation

## 2017-03-23 DIAGNOSIS — Z951 Presence of aortocoronary bypass graft: Secondary | ICD-10-CM | POA: Diagnosis not present

## 2017-03-23 DIAGNOSIS — I2510 Atherosclerotic heart disease of native coronary artery without angina pectoris: Secondary | ICD-10-CM | POA: Insufficient documentation

## 2017-03-23 DIAGNOSIS — N186 End stage renal disease: Secondary | ICD-10-CM | POA: Diagnosis not present

## 2017-03-23 DIAGNOSIS — Z87891 Personal history of nicotine dependence: Secondary | ICD-10-CM | POA: Diagnosis not present

## 2017-03-23 LAB — I-STAT CHEM 8, ED
BUN: 30 mg/dL — ABNORMAL HIGH (ref 6–20)
Calcium, Ion: 1 mmol/L — ABNORMAL LOW (ref 1.15–1.40)
Chloride: 93 mmol/L — ABNORMAL LOW (ref 101–111)
Creatinine, Ser: 6.8 mg/dL — ABNORMAL HIGH (ref 0.61–1.24)
Glucose, Bld: 85 mg/dL (ref 65–99)
HCT: 47 % (ref 39.0–52.0)
Hemoglobin: 16 g/dL (ref 13.0–17.0)
Potassium: 4.5 mmol/L (ref 3.5–5.1)
Sodium: 134 mmol/L — ABNORMAL LOW (ref 135–145)
TCO2: 33 mmol/L — ABNORMAL HIGH (ref 22–32)

## 2017-03-23 LAB — BASIC METABOLIC PANEL
Anion gap: 16 — ABNORMAL HIGH (ref 5–15)
BUN: 21 mg/dL — ABNORMAL HIGH (ref 6–20)
CO2: 26 mmol/L (ref 22–32)
Calcium: 9.5 mg/dL (ref 8.9–10.3)
Chloride: 93 mmol/L — ABNORMAL LOW (ref 101–111)
Creatinine, Ser: 6.98 mg/dL — ABNORMAL HIGH (ref 0.61–1.24)
GFR calc Af Amer: 8 mL/min — ABNORMAL LOW (ref >60)
GFR calc non Af Amer: 7 mL/min — ABNORMAL LOW (ref >60)
Glucose, Bld: 91 mg/dL (ref 65–99)
Potassium: 4.1 mmol/L (ref 3.5–5.1)
Sodium: 135 mmol/L (ref 135–145)

## 2017-03-23 LAB — CULTURE, BLOOD (ROUTINE X 2)
Culture: NO GROWTH
Culture: NO GROWTH
Special Requests: ADEQUATE
Special Requests: ADEQUATE

## 2017-03-23 LAB — I-STAT TROPONIN, ED
Troponin i, poc: 0.04 ng/mL (ref 0.00–0.08)
Troponin i, poc: 0.03 ng/mL (ref 0.00–0.08)

## 2017-03-23 LAB — CBC
HCT: 41.6 % (ref 39.0–52.0)
Hemoglobin: 13.7 g/dL (ref 13.0–17.0)
MCH: 29.7 pg (ref 26.0–34.0)
MCHC: 32.9 g/dL (ref 30.0–36.0)
MCV: 90.2 fL (ref 78.0–100.0)
Platelets: 248 10*3/uL (ref 150–400)
RBC: 4.61 MIL/uL (ref 4.22–5.81)
RDW: 15.5 % (ref 11.5–15.5)
WBC: 6.3 10*3/uL (ref 4.0–10.5)

## 2017-03-23 LAB — I-STAT CG4 LACTIC ACID, ED: Lactic Acid, Venous: 1.05 mmol/L (ref 0.5–1.9)

## 2017-03-23 MED ORDER — ISOSORBIDE MONONITRATE ER 30 MG PO TB24: 60.0000 mg | ORAL_TABLET | Freq: Every day | ORAL | Status: DC

## 2017-03-23 MED ORDER — HYDROMORPHONE HCL 1 MG/ML IJ SOLN
0.5000 mg | Freq: Once | INTRAMUSCULAR | Status: AC
  Administered 2017-03-23: 0.5 mg via INTRAVENOUS
  Filled 2017-03-23: qty 1

## 2017-03-23 MED ORDER — ASPIRIN 81 MG PO CHEW: 324.0000 mg | CHEWABLE_TABLET | Freq: Once | ORAL | Status: DC

## 2017-03-23 MED ORDER — ISOSORBIDE MONONITRATE ER 60 MG PO TB24: 120.0000 mg | ORAL_TABLET | Freq: Every day | ORAL | 0 refills | Status: DC

## 2017-03-23 NOTE — ED Notes (Signed)
Patient transported to X-ray 

## 2017-03-23 NOTE — ED Notes (Signed)
ED Provider at bedside. Tyrone Nine

## 2017-03-23 NOTE — ED Provider Notes (Signed)
[facility] EMERGENCY DEPARTMENT Provider Note   CSN: [id number] Arrival date & time: 03/23/17  8182     History   Chief Complaint Chief Complaint  Patient presents with  . Chest Pain    HPI Jon Anderson is a 66 y.o. male.  66 yo M with a chief complaint of chest pain.  This happened while the patient was getting dialysis.  Patient had similar pains prior to receiving a 1 vessel CABG. this was done at [facility].  Patient was recently in our hospital for presumed fluid overload.  He has had significant improvement of his fluid status and was discharged yesterday.  Was at dialysis today and started having pain like he used to.  Had some radiation to the right neck and had some nausea but denies vomiting.  Pain was so severe he had to stop dialysis.  He denied any symptoms when he was not on dialysis.  Has some mild continued pain.   The history is provided by the patient.  Chest Pain   This is a recurrent problem. The current episode started less than 1 hour ago. The problem occurs constantly. The problem has not changed since onset.Associated with:  During dialysis. The pain is present in the lateral region. The pain is at a severity of 6/10. The pain is moderate. The quality of the pain is described as brief, heavy and pressure-like. The pain radiates to the right neck. Duration of episode(s) is 2 hours. Associated symptoms include nausea and shortness of breath. Pertinent negatives include no abdominal pain, no fever, no headaches, no palpitations and no vomiting. He has tried nothing for the symptoms. The treatment provided no relief.  His past medical history is significant for MI.    Past Medical History:  Diagnosis Date  . Anemia of chronic disease   . CAD (coronary artery disease)    a.  Myoview 4/11: EF 53%, no scar or ischemia   c. MV 2012 Nl perfusion, apical thinning.  No ischemia or scar.  EF 49%, appears greater by visual estimate.;  d.  Dob  stress echo 12/13:  Negative Dob stress echo. There is no evidence of ischemia.  The LVF is normal. b. Normal cors 2016.  . Carotid stenosis    a. <99% RICA, >37% LICA by duplex 04/6965  . Chronic chest pain    occ  . ESRD (end stage renal disease) on dialysis ([place])   . GERD (gastroesophageal reflux disease)   . HTN (hypertension)    echo 3/10: EF 60%, LAE  . Hyperlipidemia   . Nephrolithiasis    "passed them all"  . Peripheral arterial disease ([place])    a. s/p PTCA to L SFA.  Marland Kitchen Pneumonia   . Restless legs   . Snores    a. presumed OSA, pt has refused sleep eval in past.  . Type II diabetes mellitus [facility])     Patient Active Problem List   Diagnosis Date Noted  . 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 Anemia of chronic disease 03/17/2017  . Severe uncontrolled hypertension 03/17/2017  . ESRD (end stage renal disease) on dialysis ([place]) 03/17/2017  . Hyperlipidemia 03/17/2017  . Chronic chest pain 03/17/2017  . Type II diabetes mellitus ([place]) 03/17/2017  . Acute on chronic diastolic heart failure ([place]) 03/17/2017  . Acute heart failure ([place]) 03/17/2017  . Lumbar radiculopathy 01/16/2017  . Chest pain, non-cardiac 10/09/2015  . Acute on chronic renal failure ([place]) 06/16/2014  .  Renal failure (ARF), acute on chronic (H[facility]) 06/16/2014  . Shoulder pain, left 12/15/2013  . Chest pain 12/15/2013  . Claudication ([place]) 12/11/2013  . PVD (peripheral vascular disease) ([place]) 12/11/2013  . Peripheral arterial disease ([place]) 09/30/2013  . Carotid artery disease ([place]) 09/30/2013  . Acute chest pain 11/15/2012  . Left-sided chest wall pain 03/19/2012  . Bruit 09/15/2010  . CAD (coronary artery disease) nonobstructive per cath 2012   . Chronic kidney disease (CKD), stage IV (severe) ([place])   . Precordial chest pain 07/26/2009  . DM (diabetes mellitus) ([place]) 07/22/2009  . Hyperlipidemia LDL goal <70 07/22/2009  . Hypertension 07/22/2009    Past Surgical History:  Procedure Laterality  Date  . ANGIOPLASTY / STENTING FEMORAL Left 12/11/2013   dr berry  . AV FISTULA PLACEMENT Left 03/19/2014   Procedure: CREATION OF ARTERIOVENOUS (AV) FISTULA  LEFT UPPER ARM;  Surgeon: Mal Misty, MD;  Location: [facility];  Service: Vascular;  Laterality: Left;  . CARDIAC CATHETERIZATION  2001 and 2010   . COLONOSCOPY W/ BIOPSIES AND POLYPECTOMY    . COLONOSCOPY WITH PROPOFOL N/A 08/01/2016   Procedure: COLONOSCOPY WITH PROPOFOL;  Surgeon: Carol Ada, MD;  Location: [facility];  Service: Endoscopy;  Laterality: N/A;  . ESOPHAGOGASTRODUODENOSCOPY (EGD) WITH PROPOFOL N/A 08/01/2016   Procedure: ESOPHAGOGASTRODUODENOSCOPY (EGD) WITH PROPOFOL;  Surgeon: Carol Ada, MD;  Location: [facility];  Service: Endoscopy;  Laterality: N/A;  . FOOT FRACTURE SURGERY Right   . FRACTURE SURGERY    . INGUINAL HERNIA REPAIR Left   . LEFT HEART CATHETERIZATION WITH CORONARY ANGIOGRAM N/A 06/22/2014   Procedure: LEFT HEART CATHETERIZATION WITH CORONARY ANGIOGRAM;  Surgeon: Troy Sine, MD;  Location: [facility] CATH LAB;  Service: Cardiovascular;  Laterality: N/A;  . LOWER EXTREMITY ANGIOGRAM Left 12/11/2013   Procedure: LOWER EXTREMITY ANGIOGRAM;  Surgeon: Lorretta Harp, MD;  Location: [facility] CATH LAB;  Service: Cardiovascular;  Laterality: Left;  . TONSILLECTOMY AND ADENOIDECTOMY         Home Medications    Prior to Admission medications   Medication Sig Start Date End Date Taking? Authorizing Provider  amLODipine (NORVASC) 5 MG tablet Take 1 tablet (5 mg total) by mouth at bedtime. 03/22/17  Yes Aline August, MD  amoxicillin-clavulanate (AUGMENTIN) 500-125 MG tablet Take 1 tablet (500 mg total) by mouth at bedtime for 2 days. 03/23/17 03/25/17 Yes Aline August, MD  aspirin EC 81 MG tablet Take 1 tablet (81 mg total) by mouth daily. 05/28/12  Yes Weaver, Scott T, PA-C  atorvastatin (LIPITOR) 40 MG tablet Take 1 tablet (40 mg total) by mouth daily at 6 PM. 12/13/13  Yes Brett Canales, PA-C  calcium acetate  (PHOSLO) 667 MG capsule Take 3 capsules (2,001 mg total) by mouth 3 (three) times daily with meals. Patient taking differently: Take 1,667-2,001 mg by mouth See admin instructions. 1334mg  by mouth twice daily, 2001mg  with snacks in between if needed for supplement 10/09/15  Yes Short, Noah Delaine, MD  clopidogrel (PLAVIX) 75 MG tablet Take 1 tablet (75 mg total) by mouth daily with breakfast. 12/13/13  Yes Brett Canales, PA-C  gabapentin (NEURONTIN) 300 MG capsule Take 300 mg by mouth 2 (two) times daily. 09/25/16  Yes [provider]  HYDROcodone-acetaminophen (NORCO) 5-325 MG tablet Take 1-2 tablets by mouth daily as needed. Patient taking differently: Take 1 tablet by mouth every 12 (twelve) hours as needed for moderate pain.  01/16/17  Yes Leandrew Koyanagi, MD  lisinopril (PRINIVIL,ZESTRIL) 20 MG tablet Take  20 mg by mouth at bedtime.   Yes [provider]  metoprolol tartrate (LOPRESSOR) 25 MG tablet Take 1 tablet (25 mg total) by mouth 2 (two) times daily. 03/22/17  Yes Aline August, MD  multivitamin (RENA-VIT) TABS tablet Take 1 tablet by mouth at bedtime. 06/23/14  Yes Domenic Polite, MD  pantoprazole (PROTONIX) 40 MG tablet Take 40 mg by mouth daily.   Yes [provider]  polyethylene glycol (MIRALAX / GLYCOLAX) packet Take 17 g by mouth daily as needed for moderate constipation. 03/22/17  Yes Aline August, MD  rOPINIRole (REQUIP) 0.5 MG tablet Take 0.5 mg by mouth See admin instructions. Pt takes every night at bedtime and three days per week at dialysis   Yes [provider]  tamsulosin (FLOMAX) 0.4 MG CAPS capsule Take 0.4 mg by mouth daily.   Yes [provider]  traMADol (ULTRAM) 50 MG tablet Take 1 tablet (50 mg total) every 12 (twelve) hours as needed by mouth. Patient taking differently: Take 50 mg by mouth every 6 (six) hours as needed for moderate pain.  02/23/17  Yes Leandrew Koyanagi, MD  isosorbide mononitrate (IMDUR) 60 MG 24 hr tablet Take 2  tablets (120 mg total) by mouth daily. 03/23/17   Deno Etienne, DO  nitroGLYCERIN (NITROSTAT) 0.4 MG SL tablet Place 1 tablet (0.4 mg total) under the tongue every 5 (five) minutes as needed for chest pain. 03/20/12   Barrett, Evelene Croon, PA-C    Family History Family History  Problem Relation Age of Onset  . Heart attack Sister        died @ 54  . Cancer Mother        died @ 62; unknown type  . Diabetes Brother        deceased  . Cirrhosis Father        alcohol related  . Diabetes Father   . Esophageal cancer Neg Hx   . Colon cancer Neg Hx   . Pancreatic cancer Neg Hx   . Stomach cancer Neg Hx     Social History Social History   Tobacco Use  . Smoking status: Former Smoker    Packs/day: 1.00    Years: 2.00    Pack years: 2.00    Types: Cigarettes  . Smokeless tobacco: Never Used  . Tobacco comment: quit smoking 40 yrs ago  Substance Use Topics  . Alcohol use: No    Alcohol/week: 0.0 oz  . Drug use: No     Allergies   Kiwi extract and Tape   Review of Systems Review of Systems  Constitutional: Negative for chills and fever.  HENT: Negative for congestion and facial swelling.   Eyes: Negative for discharge and visual disturbance.  Respiratory: Positive for shortness of breath.   Cardiovascular: Positive for chest pain. Negative for palpitations.  Gastrointestinal: Positive for nausea. Negative for abdominal pain, diarrhea and vomiting.  Musculoskeletal: Negative for arthralgias and myalgias.  Skin: Negative for color change and rash.  Neurological: Negative for tremors, syncope and headaches.  Psychiatric/Behavioral: Negative for confusion and dysphoric mood.     Physical Exam Updated Vital Signs BP 132/77   Pulse 70   Temp 97.9 F (36.6 C)   Resp 13   Ht 5\' 11"  (1.803 m)   Wt 68 kg (150 lb)   SpO2 100%   BMI 20.92 kg/m   Physical Exam  Constitutional: He is oriented to person, place, and time. He appears well-developed and well-nourished.  HENT:  Head: Normocephalic and atraumatic.  Eyes: EOM are normal. Pupils are equal, round, and reactive to light.  Neck: Normal range of motion. Neck supple. No JVD present.  Cardiovascular: Normal rate and regular rhythm. Exam reveals no gallop and no friction rub.  No murmur heard. Pulmonary/Chest: No respiratory distress. He has no wheezes.  Abdominal: He exhibits no distension. There is no rebound and no guarding.  Musculoskeletal: Normal range of motion.  Left AV fistula with palpable thrill  Neurological: He is alert and oriented to person, place, and time.  Skin: No rash noted. No pallor.  Psychiatric: He has a normal mood and affect. His behavior is normal.  Nursing note and vitals reviewed.    ED Treatments / Results  Labs (all labs ordered are listed, but only abnormal results are displayed) Labs Reviewed  BASIC METABOLIC PANEL - Abnormal; Notable for the following components:      Result Value   Chloride 93 (*)    BUN 21 (*)    Creatinine, Ser 6.98 (*)    GFR calc non Af Amer 7 (*)    GFR calc Af Amer 8 (*)    Anion gap 16 (*)    All other components within normal limits  I-STAT CHEM 8, ED - Abnormal; Notable for the following components:   Sodium 134 (*)    Chloride 93 (*)    BUN 30 (*)    Creatinine, Ser 6.80 (*)    Calcium, Ion 1.00 (*)    TCO2 33 (*)    All other components within normal limits  CBC  I-STAT TROPONIN, ED  I-STAT TROPONIN, ED  I-STAT CG4 LACTIC ACID, ED  I-STAT TROPONIN, ED  CBG MONITORING, ED  I-STAT CG4 LACTIC ACID, ED    EKG  EKG Interpretation  Date/Time:  Friday March 23 2017 09:30:03 EST Ventricular Rate:  84 PR Interval:    QRS Duration: 99 QT Interval:  390 QTC Calculation: 461 R Axis:   86 Text Interpretation:  Sinus rhythm Anterior infarct, old Borderline T abnormalities, inferior leads Borderline ST elevation, lateral leads slope improved to anterior segments, biphasic lateral t waves seen on prior Otherwise no significant  change Confirmed by Deno Etienne [phone]) on 03/23/2017 9:34:34 AM       Radiology Dg Chest 2 View  Result Date: 03/23/2017 CLINICAL DATA:  Chest pain EXAM: CHEST  2 VIEW COMPARISON:  03/19/2017 chest radiograph. FINDINGS: Stable cardiomediastinal silhouette with normal heart size and aortic atherosclerosis. No pneumothorax. No pleural effusion. Lungs appear clear, with no acute consolidative airspace disease and no pulmonary edema. IMPRESSION: No active cardiopulmonary disease. Electronically Signed   By: Ilona Sorrel M.D.   On: 03/23/2017 10:25    Procedures Procedures (including critical care time)  Medications Ordered in ED Medications  isosorbide mononitrate (IMDUR) 24 hr tablet 60 mg (not administered)  HYDROmorphone (DILAUDID) injection 0.5 mg (0.5 mg Intravenous Given 03/23/17 1150)     Initial Impression / Assessment and Plan / ED Course  I have reviewed the triage vital signs and the nursing notes.  Pertinent labs & imaging results that were available during my care of the patient were reviewed by me and considered in my medical decision making (see chart for details).     66 yo M with a chief complaint of chest pain while getting dialysis.  Patient feels that this is similar to his prior pain before he had a one-vessel bypass.  EKG with no concerning changes.  Chest x-ray without fluid  overload or pneumonia.  I will discuss with cardiology.  The case was discussed with Dr. Percival Spanish, who saw the patient while he was recently in the hospital.  He felt that this was unlikely to be acute ischemic disease and more of chronic chest pain.  Recommended obtaining a delta troponin and then increasing the patient's Imdur to 120 from 60.  Delta negative. D/c home.   3:21 PM:  I have discussed the diagnosis/risks/treatment options with the patient and family and believe the pt to be eligible for discharge home to follow-up with PCP, cards. We also discussed returning to the ED immediately  if new or worsening sx occur. We discussed the sx which are most concerning (e.g., sudden worsening pain, fever, inability to tolerate by mouth) that necessitate immediate return. Medications administered to the patient during their visit and any new prescriptions provided to the patient are listed below.  Medications given during this visit Medications  isosorbide mononitrate (IMDUR) 24 hr tablet 60 mg (not administered)  HYDROmorphone (DILAUDID) injection 0.5 mg (0.5 mg Intravenous Given 03/23/17 1150)     The patient appears reasonably screen and/or stabilized for discharge and I doubt any other medical condition or other [facility] requiring further screening, evaluation, or treatment in the ED at this time prior to discharge.    Final Clinical Impressions(s) / ED Diagnoses   Final diagnoses:  Nonspecific chest pain    ED Discharge Orders        Ordered    isosorbide mononitrate (IMDUR) 60 MG 24 hr tablet  Daily     03/23/17 1249       Deno Etienne, DO 03/23/17 1521

## 2017-03-23 NOTE — ED Notes (Signed)
Discharged by other nurse

## 2017-03-23 NOTE — ED Triage Notes (Signed)
Patient arrived from dialysis center with reports of chest pains expressed as right dull- like pains. 2 1/2 hours left to dialysis treatment. Patient complaining of pain towards right flank area. EMS provided 324 aspirin. Pain originally reported at 8, currently 3 on scale of 0-10. Recent hospital stay, bypass surgery in October .

## 2017-04-10 HISTORY — PX: CORONARY ARTERY BYPASS GRAFT: SHX141

## 2017-04-27 ENCOUNTER — Encounter (HOSPITAL_COMMUNITY): Payer: Self-pay | Admitting: Emergency Medicine

## 2017-04-27 ENCOUNTER — Emergency Department (HOSPITAL_COMMUNITY): Payer: Medicare Other

## 2017-04-27 ENCOUNTER — Encounter (HOSPITAL_COMMUNITY)
Admission: EM | Disposition: A | Discharge status: Home / Self Care | Payer: Self-pay | Source: Home / Self Care | Attending: Emergency Medicine

## 2017-04-27 ENCOUNTER — Observation Stay (HOSPITAL_COMMUNITY)
Admission: EM | Admit: 2017-04-27 | Discharge: 2017-04-28 | Disposition: A | Discharge status: Home / Self Care | Payer: Medicare Other | Attending: Nephrology | Admitting: Nephrology

## 2017-04-27 ENCOUNTER — Other Ambulatory Visit: Payer: Self-pay

## 2017-04-27 DIAGNOSIS — Z87891 Personal history of nicotine dependence: Secondary | ICD-10-CM | POA: Diagnosis not present

## 2017-04-27 DIAGNOSIS — E1151 Type 2 diabetes mellitus with diabetic peripheral angiopathy without gangrene: Secondary | ICD-10-CM | POA: Diagnosis not present

## 2017-04-27 DIAGNOSIS — I2510 Atherosclerotic heart disease of native coronary artery without angina pectoris: Secondary | ICD-10-CM | POA: Insufficient documentation

## 2017-04-27 DIAGNOSIS — I208 Other forms of angina pectoris: Secondary | ICD-10-CM

## 2017-04-27 DIAGNOSIS — G2581 Restless legs syndrome: Secondary | ICD-10-CM

## 2017-04-27 DIAGNOSIS — I25720 Atherosclerosis of autologous artery coronary artery bypass graft(s) with unstable angina pectoris: Secondary | ICD-10-CM | POA: Diagnosis not present

## 2017-04-27 DIAGNOSIS — E1122 Type 2 diabetes mellitus with diabetic chronic kidney disease: Secondary | ICD-10-CM | POA: Insufficient documentation

## 2017-04-27 DIAGNOSIS — Z79899 Other long term (current) drug therapy: Secondary | ICD-10-CM | POA: Insufficient documentation

## 2017-04-27 DIAGNOSIS — R079 Chest pain, unspecified: Secondary | ICD-10-CM | POA: Diagnosis not present

## 2017-04-27 DIAGNOSIS — K219 Gastro-esophageal reflux disease without esophagitis: Secondary | ICD-10-CM | POA: Insufficient documentation

## 2017-04-27 DIAGNOSIS — I10 Essential (primary) hypertension: Secondary | ICD-10-CM | POA: Diagnosis not present

## 2017-04-27 DIAGNOSIS — E785 Hyperlipidemia, unspecified: Secondary | ICD-10-CM | POA: Diagnosis not present

## 2017-04-27 DIAGNOSIS — Z951 Presence of aortocoronary bypass graft: Secondary | ICD-10-CM

## 2017-04-27 DIAGNOSIS — Z992 Dependence on renal dialysis: Secondary | ICD-10-CM | POA: Insufficient documentation

## 2017-04-27 DIAGNOSIS — N186 End stage renal disease: Secondary | ICD-10-CM

## 2017-04-27 DIAGNOSIS — Z8249 Family history of ischemic heart disease and other diseases of the circulatory system: Secondary | ICD-10-CM | POA: Insufficient documentation

## 2017-04-27 DIAGNOSIS — E119 Type 2 diabetes mellitus without complications: Secondary | ICD-10-CM

## 2017-04-27 DIAGNOSIS — Z9861 Coronary angioplasty status: Secondary | ICD-10-CM | POA: Insufficient documentation

## 2017-04-27 DIAGNOSIS — I25119 Atherosclerotic heart disease of native coronary artery with unspecified angina pectoris: Secondary | ICD-10-CM | POA: Diagnosis not present

## 2017-04-27 DIAGNOSIS — I120 Hypertensive chronic kidney disease with stage 5 chronic kidney disease or end stage renal disease: Secondary | ICD-10-CM | POA: Diagnosis not present

## 2017-04-27 DIAGNOSIS — Z7902 Long term (current) use of antithrombotics/antiplatelets: Secondary | ICD-10-CM | POA: Insufficient documentation

## 2017-04-27 DIAGNOSIS — D631 Anemia in chronic kidney disease: Secondary | ICD-10-CM | POA: Insufficient documentation

## 2017-04-27 DIAGNOSIS — Z7982 Long term (current) use of aspirin: Secondary | ICD-10-CM | POA: Diagnosis not present

## 2017-04-27 HISTORY — PX: LEFT HEART CATH AND CORS/GRAFTS ANGIOGRAPHY: CATH118250

## 2017-04-27 LAB — CBC
HCT: 39.5 % (ref 39.0–52.0)
HCT: 39.9 % (ref 39.0–52.0)
Hemoglobin: 13.3 g/dL (ref 13.0–17.0)
Hemoglobin: 13.2 g/dL (ref 13.0–17.0)
MCH: 29.6 pg (ref 26.0–34.0)
MCH: 29.9 pg (ref 26.0–34.0)
MCHC: 33.7 g/dL (ref 30.0–36.0)
MCHC: 33.1 g/dL (ref 30.0–36.0)
MCV: 88 fL (ref 78.0–100.0)
MCV: 90.3 fL (ref 78.0–100.0)
Platelets: 222 10*3/uL (ref 150–400)
Platelets: 187 10*3/uL (ref 150–400)
RBC: 4.49 MIL/uL (ref 4.22–5.81)
RBC: 4.42 MIL/uL (ref 4.22–5.81)
RDW: 14 % (ref 11.5–15.5)
RDW: 14.5 % (ref 11.5–15.5)
WBC: 7.6 10*3/uL (ref 4.0–10.5)
WBC: 5 10*3/uL (ref 4.0–10.5)

## 2017-04-27 LAB — CREATININE, SERUM
Creatinine, Ser: 6.79 mg/dL — ABNORMAL HIGH (ref 0.61–1.24)
GFR calc Af Amer: 9 mL/min — ABNORMAL LOW (ref >60)
GFR calc non Af Amer: 7 mL/min — ABNORMAL LOW (ref >60)

## 2017-04-27 LAB — BASIC METABOLIC PANEL
Anion gap: 15 (ref 5–15)
BUN: 16 mg/dL (ref 6–20)
CO2: 29 mmol/L (ref 22–32)
Calcium: 9.2 mg/dL (ref 8.9–10.3)
Chloride: 92 mmol/L — ABNORMAL LOW (ref 101–111)
Creatinine, Ser: 5.42 mg/dL — ABNORMAL HIGH (ref 0.61–1.24)
GFR calc Af Amer: 11 mL/min — ABNORMAL LOW (ref >60)
GFR calc non Af Amer: 10 mL/min — ABNORMAL LOW (ref >60)
Glucose, Bld: 81 mg/dL (ref 65–99)
Potassium: 3.4 mmol/L — ABNORMAL LOW (ref 3.5–5.1)
Sodium: 136 mmol/L (ref 135–145)

## 2017-04-27 LAB — I-STAT TROPONIN, ED: Troponin i, poc: 0.02 ng/mL (ref 0.00–0.08)

## 2017-04-27 LAB — TROPONIN I
Troponin I: 0.03 ng/mL (ref <0.03)
Troponin I: 0.04 ng/mL (ref <0.03)

## 2017-04-27 LAB — PROTIME-INR
INR: 0.95
Prothrombin Time: 12.6 seconds (ref 11.4–15.2)

## 2017-04-27 LAB — GLUCOSE, CAPILLARY
Glucose-Capillary: 100 mg/dL — ABNORMAL HIGH (ref 65–99)
Glucose-Capillary: 57 mg/dL — ABNORMAL LOW (ref 65–99)

## 2017-04-27 SURGERY — LEFT HEART CATH AND CORS/GRAFTS ANGIOGRAPHY
Anesthesia: LOCAL

## 2017-04-27 MED ORDER — NITROGLYCERIN 0.4 MG SL SUBL: 0.4000 mg | SUBLINGUAL_TABLET | SUBLINGUAL | Status: DC | PRN

## 2017-04-27 MED ORDER — MIDAZOLAM HCL 2 MG/2ML IJ SOLN
INTRAMUSCULAR | Status: AC
  Filled 2017-04-27: qty 2

## 2017-04-27 MED ORDER — TAMSULOSIN HCL 0.4 MG PO CAPS
0.4000 mg | ORAL_CAPSULE | Freq: Every day | ORAL | Status: DC
  Administered 2017-04-28: 0.4 mg via ORAL
  Filled 2017-04-27 (×2): qty 1

## 2017-04-27 MED ORDER — FENTANYL CITRATE (PF) 100 MCG/2ML IJ SOLN
INTRAMUSCULAR | Status: DC | PRN
  Administered 2017-04-27: 25 ug via INTRAVENOUS

## 2017-04-27 MED ORDER — POLYETHYLENE GLYCOL 3350 17 G PO PACK: 17.0000 g | PACK | Freq: Every day | ORAL | Status: DC | PRN

## 2017-04-27 MED ORDER — LIDOCAINE HCL (PF) 1 % IJ SOLN
INTRAMUSCULAR | Status: DC | PRN
  Administered 2017-04-27: 20 mL

## 2017-04-27 MED ORDER — SODIUM CHLORIDE 0.9% FLUSH: 3.0000 mL | INTRAVENOUS | Status: DC | PRN

## 2017-04-27 MED ORDER — HEPARIN SODIUM (PORCINE) 5000 UNIT/ML IJ SOLN
5000.0000 [IU] | Freq: Three times a day (TID) | INTRAMUSCULAR | Status: DC
  Administered 2017-04-28: 06:00:00 5000 [IU] via SUBCUTANEOUS
  Filled 2017-04-27: qty 1

## 2017-04-27 MED ORDER — NITROGLYCERIN 2 % TD OINT
1.0000 [in_us] | TOPICAL_OINTMENT | Freq: Once | TRANSDERMAL | Status: AC
  Administered 2017-04-27: 1 [in_us] via TOPICAL
  Filled 2017-04-27: qty 1

## 2017-04-27 MED ORDER — MIDAZOLAM HCL 2 MG/2ML IJ SOLN
INTRAMUSCULAR | Status: DC | PRN
  Administered 2017-04-27: 1 mg via INTRAVENOUS

## 2017-04-27 MED ORDER — ROPINIROLE HCL 0.5 MG PO TABS
0.5000 mg | ORAL_TABLET | Freq: Every day | ORAL | Status: DC
  Administered 2017-04-27: 22:00:00 0.5 mg via ORAL
  Filled 2017-04-27: qty 1

## 2017-04-27 MED ORDER — SODIUM CHLORIDE 0.9% FLUSH: 3.0000 mL | Freq: Two times a day (BID) | INTRAVENOUS | Status: DC

## 2017-04-27 MED ORDER — HEPARIN (PORCINE) IN NACL 2-0.9 UNIT/ML-% IJ SOLN
INTRAMUSCULAR | Status: AC | PRN
  Administered 2017-04-27: 1000 mL via INTRA_ARTERIAL

## 2017-04-27 MED ORDER — ISOSORBIDE MONONITRATE ER 60 MG PO TB24
120.0000 mg | ORAL_TABLET | Freq: Every day | ORAL | Status: DC
  Administered 2017-04-27 – 2017-04-28 (×2): 120 mg via ORAL
  Filled 2017-04-27 (×2): qty 2

## 2017-04-27 MED ORDER — CALCIUM ACETATE (PHOS BINDER) 667 MG PO CAPS
1334.0000 mg | ORAL_CAPSULE | Freq: Three times a day (TID) | ORAL | Status: DC
  Administered 2017-04-28: 08:00:00 1334 mg via ORAL
  Filled 2017-04-27: qty 2

## 2017-04-27 MED ORDER — HEPARIN (PORCINE) IN NACL 2-0.9 UNIT/ML-% IJ SOLN
INTRAMUSCULAR | Status: AC
  Filled 2017-04-27: qty 1000

## 2017-04-27 MED ORDER — IOPAMIDOL (ISOVUE-370) INJECTION 76%
INTRAVENOUS | Status: DC | PRN
  Administered 2017-04-27: 105 mL via INTRA_ARTERIAL

## 2017-04-27 MED ORDER — ACETAMINOPHEN 325 MG PO TABS: 650.0000 mg | ORAL_TABLET | ORAL | Status: DC | PRN

## 2017-04-27 MED ORDER — METOPROLOL TARTRATE 12.5 MG HALF TABLET
12.5000 mg | ORAL_TABLET | Freq: Two times a day (BID) | ORAL | Status: DC
  Administered 2017-04-27 – 2017-04-28 (×2): 12.5 mg via ORAL
  Filled 2017-04-27 (×2): qty 1

## 2017-04-27 MED ORDER — RENA-VITE PO TABS
1.0000 | ORAL_TABLET | Freq: Every day | ORAL | Status: DC
  Administered 2017-04-27: 21:00:00 1 via ORAL
  Filled 2017-04-27: qty 1

## 2017-04-27 MED ORDER — FENTANYL CITRATE (PF) 100 MCG/2ML IJ SOLN
INTRAMUSCULAR | Status: AC
  Filled 2017-04-27: qty 2

## 2017-04-27 MED ORDER — CLOPIDOGREL BISULFATE 75 MG PO TABS
75.0000 mg | ORAL_TABLET | Freq: Every day | ORAL | Status: DC
  Administered 2017-04-28: 75 mg via ORAL
  Filled 2017-04-27: qty 1

## 2017-04-27 MED ORDER — GABAPENTIN 300 MG PO CAPS
300.0000 mg | ORAL_CAPSULE | Freq: Two times a day (BID) | ORAL | Status: DC
  Administered 2017-04-27 – 2017-04-28 (×2): 300 mg via ORAL
  Filled 2017-04-27 (×2): qty 1

## 2017-04-27 MED ORDER — PANTOPRAZOLE SODIUM 40 MG PO TBEC
40.0000 mg | DELAYED_RELEASE_TABLET | Freq: Every day | ORAL | Status: DC
  Administered 2017-04-28: 08:00:00 40 mg via ORAL
  Filled 2017-04-27: qty 1

## 2017-04-27 MED ORDER — MORPHINE SULFATE (PF) 4 MG/ML IV SOLN
4.0000 mg | Freq: Once | INTRAVENOUS | Status: AC
  Administered 2017-04-27: 4 mg via INTRAVENOUS
  Filled 2017-04-27: qty 1

## 2017-04-27 MED ORDER — ATORVASTATIN CALCIUM 40 MG PO TABS: 40.0000 mg | ORAL_TABLET | Freq: Every day | ORAL | Status: DC

## 2017-04-27 MED ORDER — IOPAMIDOL (ISOVUE-370) INJECTION 76%
INTRAVENOUS | Status: AC
  Filled 2017-04-27: qty 125

## 2017-04-27 MED ORDER — GLYCERIN (LAXATIVE) 2.1 G RE SUPP
1.0000 | RECTAL | Status: DC | PRN
  Filled 2017-04-27: qty 1

## 2017-04-27 MED ORDER — HYDROCODONE-ACETAMINOPHEN 5-325 MG PO TABS: 1.0000 | ORAL_TABLET | Freq: Every day | ORAL | Status: DC | PRN

## 2017-04-27 MED ORDER — ASPIRIN EC 81 MG PO TBEC
81.0000 mg | DELAYED_RELEASE_TABLET | Freq: Every day | ORAL | Status: DC
  Administered 2017-04-28: 81 mg via ORAL
  Filled 2017-04-27: qty 1

## 2017-04-27 MED ORDER — ONDANSETRON HCL 4 MG/2ML IJ SOLN: 4.0000 mg | Freq: Four times a day (QID) | INTRAMUSCULAR | Status: DC | PRN

## 2017-04-27 MED ORDER — LIDOCAINE HCL (PF) 1 % IJ SOLN
INTRAMUSCULAR | Status: AC
  Filled 2017-04-27: qty 30

## 2017-04-27 MED ORDER — SODIUM CHLORIDE 0.9 % IV SOLN: 250.0000 mL | INTRAVENOUS | Status: DC | PRN

## 2017-04-27 MED ORDER — ASPIRIN 81 MG PO CHEW: 324.0000 mg | CHEWABLE_TABLET | Freq: Once | ORAL | Status: DC

## 2017-04-27 SURGICAL SUPPLY — 11 items
CATH INFINITI 5 FR IM (CATHETERS) ×1 IMPLANT
CATH INFINITI MULTIPACK ST 5F (CATHETERS) ×2 IMPLANT
KIT HEART LEFT (KITS) ×2 IMPLANT
PACK CARDIAC CATHETERIZATION (CUSTOM PROCEDURE TRAY) ×2 IMPLANT
SHEATH PINNACLE 5F 10CM (SHEATH) ×1 IMPLANT
SYR MEDRAD MARK V 150ML (SYRINGE) ×2 IMPLANT
TRANSDUCER W/STOPCOCK (MISCELLANEOUS) ×2 IMPLANT
TUBING CIL FLEX 10 FLL-RA (TUBING) ×2 IMPLANT
WIRE EMERALD 3MM-J .035X150CM (WIRE) ×1 IMPLANT
WIRE EMERALD 3MM-J .035X260CM (WIRE) ×1 IMPLANT
WIRE EMERALD ST .035X260CM (WIRE) IMPLANT

## 2017-04-27 NOTE — ED Triage Notes (Signed)
Pt to ed via [facility] from dialysis center on [address]-- with c/o chest pain -- received NTG x 1 with relief from 6/10-4/10-- also received ASA 325mg  po,  Dialysis fistula remains accessed-- will notify IV team for de-access.

## 2017-04-27 NOTE — ED Provider Notes (Signed)
[facility] EMERGENCY DEPARTMENT Provider Note   CSN: [id number] Arrival date & time: 04/27/17  1016     History   Chief Complaint Chief Complaint  Patient presents with  . Chest Pain  . ESRD    HPI   Blood pressure (!) 161/86, pulse 81, temperature (!) 97.5 F (36.4 C), temperature source Oral, resp. rate (!) 26, weight 72 kg (158 lb 11.7 oz), SpO2 99 %.  CAMAR GUYTON is a 67 y.o. male with past medical history significant for hypertension, diabetes, complaining of chest pain onset today while he was being dialyzed, left-sided associated with shortness of breath patient has history of CAD, had bypass at [facility] last [facility].  Patient was dialyzed for 2 hours this morning, difficult to obtain history, he denies any overt shortness of breath, increasing peripheral edema.  He is never had any DVT/PE.  Patient is complaining of discomfort from his restless leg.  Past Medical History:  Diagnosis Date  . Anemia of chronic disease   . CAD (coronary artery disease)    a.  Myoview 4/11: EF 53%, no scar or ischemia   c. MV 2012 Nl perfusion, apical thinning.  No ischemia or scar.  EF 49%, appears greater by visual estimate.;  d.  Dob stress echo 12/13:  Negative Dob stress echo. There is no evidence of ischemia.  The LVF is normal. b. Normal cors 2016.  . Carotid stenosis    a. <22% RICA, >29% LICA by duplex 10/9890  . Chronic chest pain    occ  . ESRD (end stage renal disease) on dialysis ([place])   . GERD (gastroesophageal reflux disease)   . HTN (hypertension)    echo 3/10: EF 60%, LAE  . Hyperlipidemia   . Nephrolithiasis    "passed them all"  . Peripheral arterial disease ([place])    a. s/p PTCA to L SFA.  Marland Kitchen Pneumonia   . Restless legs   . Snores    a. presumed OSA, pt has refused sleep eval in past.  . Type II diabetes mellitus [facility])     Patient Active Problem List   Diagnosis Date Noted  . Chest pain in adult 04/27/2017  . 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/[facility]  03/17/2017  . Anemia of chronic disease 03/17/2017  . Severe uncontrolled hypertension 03/17/2017  . ESRD (end stage renal disease) on dialysis ([place]) 03/17/2017  . Hyperlipidemia 03/17/2017  . Chronic chest pain 03/17/2017  . Type II diabetes mellitus ([place]) 03/17/2017  . Acute on chronic diastolic heart failure ([place]) 03/17/2017  . Acute heart failure ([place]) 03/17/2017  . Lumbar radiculopathy 01/16/2017  . Chest pain, non-cardiac 10/09/2015  . Acute on chronic renal failure ([place]) 06/16/2014  . Renal failure (ARF), acute on chronic (HCC) 06/16/2014  . Shoulder pain, left 12/15/2013  . Chest pain 12/15/2013  . Claudication ([place]) 12/11/2013  . PVD (peripheral vascular disease) ([place]) 12/11/2013  . Peripheral arterial disease ([place]) 09/30/2013  . Carotid artery disease ([place]) 09/30/2013  . Acute chest pain 11/15/2012  . Left-sided chest wall pain 03/19/2012  . Bruit 09/15/2010  . CAD (coronary artery disease) nonobstructive per cath 2012   . Chronic kidney disease (CKD), stage IV (severe) ([place])   . Precordial chest pain 07/26/2009  . DM (diabetes mellitus) ([place]) 07/22/2009  . Hyperlipidemia LDL goal <70 07/22/2009  . Hypertension 07/22/2009    Past Surgical History:  Procedure Laterality Date  . ANGIOPLASTY / STENTING FEMORAL Left 12/11/2013   dr berry  .  AV FISTULA PLACEMENT Left 03/19/2014   Procedure: CREATION OF ARTERIOVENOUS (AV) FISTULA  LEFT UPPER ARM;  Surgeon: Mal Misty, MD;  Location: [facility];  Service: Vascular;  Laterality: Left;  . CARDIAC CATHETERIZATION  2001 and 2010   . COLONOSCOPY W/ BIOPSIES AND POLYPECTOMY    . COLONOSCOPY WITH PROPOFOL N/A 08/01/2016   Procedure: COLONOSCOPY WITH PROPOFOL;  Surgeon: Carol Ada, MD;  Location: [facility] ENDOSCOPY;  Service: Endoscopy;  Laterality: N/A;  . ESOPHAGOGASTRODUODENOSCOPY (EGD) WITH PROPOFOL N/A 08/01/2016   Procedure: ESOPHAGOGASTRODUODENOSCOPY (EGD) WITH PROPOFOL;  Surgeon: Carol Ada, MD;  Location: [facility] ENDOSCOPY;   Service: Endoscopy;  Laterality: N/A;  . FOOT FRACTURE SURGERY Right   . FRACTURE SURGERY    . INGUINAL HERNIA REPAIR Left   . LEFT HEART CATHETERIZATION WITH CORONARY ANGIOGRAM N/A 06/22/2014   Procedure: LEFT HEART CATHETERIZATION WITH CORONARY ANGIOGRAM;  Surgeon: Troy Sine, MD;  Location: [facility] CATH LAB;  Service: Cardiovascular;  Laterality: N/A;  . LOWER EXTREMITY ANGIOGRAM Left 12/11/2013   Procedure: LOWER EXTREMITY ANGIOGRAM;  Surgeon: Lorretta Harp, MD;  Location: [facility] CATH LAB;  Service: Cardiovascular;  Laterality: Left;  . TONSILLECTOMY AND ADENOIDECTOMY         Home Medications    Prior to Admission medications   Medication Sig Start Date End Date Taking? Authorizing Provider  aspirin EC 81 MG tablet Take 1 tablet (81 mg total) by mouth daily. 05/28/12  Yes Weaver, Scott T, PA-C  atorvastatin (LIPITOR) 40 MG tablet Take 1 tablet (40 mg total) by mouth daily at 6 PM. 12/13/13  Yes Brett Canales, PA-C  calcium acetate (PHOSLO) 667 MG capsule Take 3 capsules (2,001 mg total) by mouth 3 (three) times daily with meals. Patient taking differently: Take 1,667-2,001 mg by mouth See admin instructions. 1334mg  by mouth twice daily, 2001mg  with snacks in between if needed for supplement 10/09/15  Yes Short, Noah Delaine, MD  clopidogrel (PLAVIX) 75 MG tablet Take 1 tablet (75 mg total) by mouth daily with breakfast. 12/13/13  Yes Brett Canales, PA-C  gabapentin (NEURONTIN) 300 MG capsule Take 300 mg by mouth 2 (two) times daily. 09/25/16  Yes [provider]  glycerin adult 2 g suppository Place 1 suppository rectally as needed for constipation.   Yes [provider]  HYDROcodone-acetaminophen (NORCO) 5-325 MG tablet Take 1-2 tablets by mouth daily as needed. Patient taking differently: Take 1 tablet by mouth every 12 (twelve) hours as needed for moderate pain.  01/16/17  Yes Leandrew Koyanagi, MD  isosorbide mononitrate (IMDUR) 60 MG 24 hr tablet Take 2 tablets (120 mg total) by  mouth daily. 03/23/17  Yes Deno Etienne, DO  metoprolol tartrate (LOPRESSOR) 25 MG tablet Take 1 tablet (25 mg total) by mouth 2 (two) times daily. 03/22/17  Yes Aline August, MD  multivitamin (RENA-VIT) TABS tablet Take 1 tablet by mouth at bedtime. 06/23/14  Yes Domenic Polite, MD  nitroGLYCERIN (NITROSTAT) 0.4 MG SL tablet Place 1 tablet (0.4 mg total) under the tongue every 5 (five) minutes as needed for chest pain. 03/20/12  Yes Barrett, Evelene Croon, PA-C  pantoprazole (PROTONIX) 40 MG tablet Take 40 mg by mouth daily.   Yes [provider]  polyethylene glycol (MIRALAX / GLYCOLAX) packet Take 17 g by mouth daily as needed for moderate constipation. 03/22/17  Yes Aline August, MD  rOPINIRole (REQUIP) 0.5 MG tablet Take 0.5 mg by mouth See admin instructions. Pt takes every night at bedtime and three days per week at  dialysis   Yes [provider]  tamsulosin (FLOMAX) 0.4 MG CAPS capsule Take 0.4 mg by mouth daily.   Yes [provider]  amLODipine (NORVASC) 5 MG tablet Take 1 tablet (5 mg total) by mouth at bedtime. Patient not taking: Reported on 04/27/2017 03/22/17   Aline August, MD  lisinopril (PRINIVIL,ZESTRIL) 20 MG tablet Take 20 mg by mouth at bedtime.    [provider]  traMADol (ULTRAM) 50 MG tablet Take 1 tablet (50 mg total) every 12 (twelve) hours as needed by mouth. Patient not taking: Reported on 04/27/2017 02/23/17   Leandrew Koyanagi, MD    Family History Family History  Problem Relation Age of Onset  . Heart attack Sister        died @ 75  . Cancer Mother        died @ 69; unknown type  . Diabetes Brother        deceased  . Cirrhosis Father        alcohol related  . Diabetes Father   . Esophageal cancer Neg Hx   . Colon cancer Neg Hx   . Pancreatic cancer Neg Hx   . Stomach cancer Neg Hx     Social History Social History   Tobacco Use  . Smoking status: Former Smoker    Packs/day: 1.00    Years: 2.00    Pack years: 2.00     Types: Cigarettes  . Smokeless tobacco: Never Used  . Tobacco comment: quit smoking 40 yrs ago  Substance Use Topics  . Alcohol use: No    Alcohol/week: 0.0 oz  . Drug use: No     Allergies   Kiwi extract and Tape   Review of Systems Review of Systems  A complete review of systems was obtained and all systems are negative except as noted in the HPI and PMH.   Physical Exam Updated Vital Signs BP (!) 151/76   Pulse 73   Temp (!) 97.5 F (36.4 C) (Oral)   Resp 12   Wt 72 kg (158 lb 11.7 oz)   SpO2 100%   BMI 22.14 kg/m   Physical Exam  Constitutional: He is oriented to person, place, and time. He appears well-developed and well-nourished. No distress.  HENT:  Head: Normocephalic and atraumatic.  Mouth/Throat: Oropharynx is clear and moist.  Eyes: Conjunctivae and EOM are normal. Pupils are equal, round, and reactive to light.  Neck: Normal range of motion.  Cardiovascular: Normal rate, regular rhythm and intact distal pulses.  Fistula to left antecubital area with good thrill  Pulmonary/Chest: Effort normal and breath sounds normal.  Abdominal: Soft. There is no tenderness.  Musculoskeletal: Normal range of motion.  Neurological: He is alert and oriented to person, place, and time.  Skin: He is not diaphoretic.  Psychiatric: He has a normal mood and affect.  Nursing note and vitals reviewed.    ED Treatments / Results  Labs (all labs ordered are listed, but only abnormal results are displayed) Labs Reviewed  BASIC METABOLIC PANEL - Abnormal; Notable for the following components:      Result Value   Potassium 3.4 (*)    Chloride 92 (*)    Creatinine, Ser 5.42 (*)    GFR calc non Af Amer 10 (*)    GFR calc Af Amer 11 (*)    All other components within normal limits  CBC  PROTIME-INR  I-STAT TROPONIN, ED    EKG  EKG Interpretation  Date/Time:  Friday April 27 2017 10:24:11 EST Ventricular Rate:  73 PR Interval:    QRS Duration: 104 QT  Interval:  398 QTC Calculation: 439 R Axis:   -37 Text Interpretation:  Sinus rhythm Atrial premature complex Short PR interval Left axis deviation Anterior infarct, old Baseline wander in lead(s) I III aVR aVL ST-t wave abnormality Abnormal ekg Confirmed by Carmin Muskrat [phone]) on 04/27/2017 10:50:57 AM       Radiology Dg Chest Portable 1 View  Result Date: 04/27/2017 CLINICAL DATA:  Shortness of breath. EXAM: PORTABLE CHEST 1 VIEW COMPARISON:  03/23/2017 FINDINGS: The heart size and mediastinal contours are within normal limits. There is no evidence of pulmonary edema, consolidation, pneumothorax, nodule or pleural fluid. The visualized skeletal structures are unremarkable. IMPRESSION: No active disease. Electronically Signed   By: Aletta Edouard M.D.   On: 04/27/2017 11:04    Procedures Procedures (including critical care time)  Medications Ordered in ED Medications  aspirin chewable tablet 324 mg (324 mg Oral Not Given 04/27/17 1122)  morphine 4 MG/ML injection 4 mg (not administered)  nitroGLYCERIN (NITROGLYN) 2 % ointment 1 inch (1 inch Topical Given 04/27/17 1144)     Initial Impression / Assessment and Plan / ED Course  I have reviewed the triage vital signs and the nursing notes.  Pertinent labs & imaging results that were available during my care of the patient were reviewed by me and considered in my medical decision making (see chart for details).     Vitals:   04/27/17 1200 04/27/17 1215 04/27/17 1230 04/27/17 1245  BP: 140/86 132/71 121/73 (!) 151/76  Pulse: 87 81 78 73  Resp: 11 19 17 12   Temp:      TempSrc:      SpO2: 100% 100% 100% 100%  Weight:        Medications  aspirin chewable tablet 324 mg (324 mg Oral Not Given 04/27/17 1122)  morphine 4 MG/ML injection 4 mg (not administered)  nitroGLYCERIN (NITROGLYN) 2 % ointment 1 inch (1 inch Topical Given 04/27/17 1144)    TRAVIAN Anderson is 67 y.o. male presenting with chest pain onset at dialysis  today, he was dialyzed for 2-1/2 hours.  Patient with multiple cardiac risk factors including ACS with CABG at [facility] last [facility].  EKG unchanged from prior, troponin negative.  Patient with persistent chest pain despite nitroglycerin.  Will need admission for rule out.  Case discussed with [facility] hospitalist, understands the patient is still having chest pain, will give morphine.  There is service will admit.   Final Clinical Impressions(s) / ED Diagnoses   Final diagnoses:  Chest pain, unspecified type    ED Discharge Orders    None       Karen Kays Charna Elizabeth 04/27/17 [place], MD 04/27/17 1421

## 2017-04-27 NOTE — H&P (View-Only) (Signed)
Cardiology Consultation:   Patient ID: Jon Anderson; [id number]; 1950/05/07   Admit date: 04/27/2017 Date of Consult: 04/27/2017  Primary Care Provider: [name], MD Primary Cardiologist: [facility]/ Dr. Percival Spanish    Patient Profile:   Jon Anderson is a 67 y.o. male with a hx of CAD s/p CABG LIMA to LAD at [facility] 01/2017, ESRD on HD, HTN, HLD, DM, chronic diastolic CHF and anemia  who is being seen today for the evaluation of chest pain at the request of Dr. Jamse Arn.   He was found to have LAD disease during evaluation for renal transplant>>>Cath in Oct 2018 showed 80% long prox/mid LAD s/p single vessel CABG off pump with LIMA to LAD at [facility].  Recently admitted 12/8-12/13 for chest pain and SOB at [facility]. Found to be hypertensive and acute CHF. Troponin in not in ACS pattern. Also treated with abx for possible pneumonia.  Echo showed normal LV systolic function; moderate diastolic dysfunction; moderate LVH; severe MAC with mild MS (mean gradient 5 mmHg) and trace MR; severe LAE; mild TR. No further cardiac work up recommended.   He was again seen in ER 03/23/17 for chest pain while getting dialysis. EKG without acute changes.   The case was discussed with Dr. Percival Spanish, who saw the patient while he was recently in the hospital. Increased Imdur to 120 and discharge.   History of Present Illness:   Jon Anderson is presented from dialysis center for evaluation of chest pain.  Patient had a sudden onset of chest pain and shortness of breath after completion of about 2-1/2 hours of dialysis.  His chest pain improved after sublingual nitroglycerin x 1 and aspirin.  The patient is currently chest pain-free after placement of nitroglycerin ointment in the emergency department.  The patient complains of intermittent chest pain with shortness of breath during dialysis.  He did not took his Imdur today as he takes after dialysis sessions.  The patient endorsed to having chest heaviness with  shortness of breath with extreme exertion.  The patient states that he gets coldness prior to chest pain or shortness of breath.  No fever, chills or sick contact.  This is similar to prior angina when he required CABG.  He denies orthopnea, PND, syncope, lower extremity edema or melena.  His blood pressure was 161/86 on presentation.  Now normal.  Potassium 3.4.  Creatinine 5.4.  Point-of-care troponin 0.02.  Normal hemoglobin.  Chest x-ray without acute cardiopulmonary disease.  EKG shows sinus rhythm at rate of 72 bpm with PAC-personally reviewed.  Past Medical History:  Diagnosis Date  . Anemia of chronic disease   . CAD (coronary artery disease)    a.  Myoview 4/11: EF 53%, no scar or ischemia   c. MV 2012 Nl perfusion, apical thinning.  No ischemia or scar.  EF 49%, appears greater by visual estimate.;  d.  Dob stress echo 12/13:  Negative Dob stress echo. There is no evidence of ischemia.  The LVF is normal. b. Normal cors 2016.  . Carotid stenosis    a. <26% RICA, >94% LICA by duplex 11/5460  . Chronic chest pain    occ  . ESRD (end stage renal disease) on dialysis ([place])   . GERD (gastroesophageal reflux disease)   . HTN (hypertension)    echo 3/10: EF 60%, LAE  . Hyperlipidemia   . Nephrolithiasis    "passed them all"  . Peripheral arterial disease ([place])    a. s/p PTCA to L SFA.  Marland Kitchen  Pneumonia   . Restless legs   . Snores    a. presumed OSA, pt has refused sleep eval in past.  . Type II diabetes mellitus ([place])     Past Surgical History:  Procedure Laterality Date  . ANGIOPLASTY / STENTING FEMORAL Left 12/11/2013   dr berry  . AV FISTULA PLACEMENT Left 03/19/2014   Procedure: CREATION OF ARTERIOVENOUS (AV) FISTULA  LEFT UPPER ARM;  Surgeon: Mal Misty, MD;  Location: [facility];  Service: Vascular;  Laterality: Left;  . CARDIAC CATHETERIZATION  2001 and 2010   . COLONOSCOPY W/ BIOPSIES AND POLYPECTOMY    . COLONOSCOPY WITH PROPOFOL N/A 08/01/2016   Procedure: COLONOSCOPY  WITH PROPOFOL;  Surgeon: Carol Ada, MD;  Location: [facility];  Service: Endoscopy;  Laterality: N/A;  . ESOPHAGOGASTRODUODENOSCOPY (EGD) WITH PROPOFOL N/A 08/01/2016   Procedure: ESOPHAGOGASTRODUODENOSCOPY (EGD) WITH PROPOFOL;  Surgeon: Carol Ada, MD;  Location: [facility];  Service: Endoscopy;  Laterality: N/A;  . FOOT FRACTURE SURGERY Right   . FRACTURE SURGERY    . INGUINAL HERNIA REPAIR Left   . LEFT HEART CATHETERIZATION WITH CORONARY ANGIOGRAM N/A 06/22/2014   Procedure: LEFT HEART CATHETERIZATION WITH CORONARY ANGIOGRAM;  Surgeon: Troy Sine, MD;  Location: [facility] CATH LAB;  Service: Cardiovascular;  Laterality: N/A;  . LOWER EXTREMITY ANGIOGRAM Left 12/11/2013   Procedure: LOWER EXTREMITY ANGIOGRAM;  Surgeon: Lorretta Harp, MD;  Location: [facility] C[facility] LAB;  Service: Cardiovascular;  Laterality: Left;  . TONSILLECTOMY AND ADENOIDECTOMY       Home Medications:  Prior to Admission medications   Medication Sig Start Date End Date Taking? Authorizing Provider  aspirin EC 81 MG tablet Take 1 tablet (81 mg total) by mouth daily. 05/28/12  Yes Weaver, Scott T, PA-C  atorvastatin (LIPITOR) 40 MG tablet Take 1 tablet (40 mg total) by mouth daily at 6 PM. 12/13/13  Yes Brett Canales, PA-C  calcium acetate (PHOSLO) 667 MG capsule Take 3 capsules (2,001 mg total) by mouth 3 (three) times daily with meals. Patient taking differently: Take 1,667-2,001 mg by mouth See admin instructions. 1364m by mouth twice daily, 20064mwith snacks in between if needed for supplement 10/09/15  Yes Short, MaNoah DelaineMD  clopidogrel (PLAVIX) 75 MG tablet Take 1 tablet (75 mg total) by mouth daily with breakfast. 12/13/13  Yes HaBrett CanalesPA-C  gabapentin (NEURONTIN) 300 MG capsule Take 300 mg by mouth 2 (two) times daily. 09/25/16  Yes [provider]  glycerin adult 2 g suppository Place 1 suppository rectally as needed for constipation.   Yes [provider]  HYDROcodone-acetaminophen (NORCO)  5-325 MG tablet Take 1-2 tablets by mouth daily as needed. Patient taking differently: Take 1 tablet by mouth every 12 (twelve) hours as needed for moderate pain.  01/16/17  Yes XuLeandrew KoyanagiMD  isosorbide mononitrate (IMDUR) 60 MG 24 hr tablet Take 2 tablets (120 mg total) by mouth daily. 03/23/17  Yes FlDeno EtienneDO  metoprolol tartrate (LOPRESSOR) 25 MG tablet Take 1 tablet (25 mg total) by mouth 2 (two) times daily. 03/22/17  Yes AlAline AugustMD  multivitamin (RENA-VIT) TABS tablet Take 1 tablet by mouth at bedtime. 06/23/14  Yes JoDomenic PoliteMD  nitroGLYCERIN (NITROSTAT) 0.4 MG SL tablet Place 1 tablet (0.4 mg total) under the tongue every 5 (five) minutes as needed for chest pain. 03/20/12  Yes Barrett, RhEvelene CroonPA-C  pantoprazole (PROTONIX) 40 MG tablet Take 40 mg by mouth daily.   Yes [provider]  polyethylene glycol (MIRALAX / GLYCOLAX) packet Take 17 g by mouth daily as needed for moderate constipation. 03/22/17  Yes Aline August, MD  rOPINIRole (REQUIP) 0.5 MG tablet Take 0.5 mg by mouth See admin instructions. Pt takes every night at bedtime and three days per week at dialysis   Yes [provider]  tamsulosin (FLOMAX) 0.4 MG CAPS capsule Take 0.4 mg by mouth daily.   Yes [provider]  amLODipine (NORVASC) 5 MG tablet Take 1 tablet (5 mg total) by mouth at bedtime. Patient not taking: Reported on 04/27/2017 03/22/17   Aline August, MD  lisinopril (PRINIVIL,ZESTRIL) 20 MG tablet Take 20 mg by mouth at bedtime.    [provider]  traMADol (ULTRAM) 50 MG tablet Take 1 tablet (50 mg total) every 12 (twelve) hours as needed by mouth. Patient not taking: Reported on 04/27/2017 02/23/17   Leandrew Koyanagi, MD    Inpatient Medications: Scheduled Meds: . aspirin  324 mg Oral Once  .  morphine injection  4 mg Intravenous Once   Continuous Infusions:  PRN Meds:   Allergies:    Allergies  Allergen Reactions  . Kiwi Extract Swelling     Facial swelling   . Tape Other (See Comments)    Plastic tape-blistering    Social History:   Social History   Socioeconomic History  . Marital status: Married    Spouse name: Not on file  . Number of children: Not on file  . Years of education: Not on file  . Highest education level: Not on file  Social Needs  . Financial resource strain: Not on file  . Food insecurity - worry: Not on file  . Food insecurity - inability: Not on file  . Transportation needs - medical: Not on file  . Transportation needs - non-medical: Not on file  Occupational History  . Not on file  Tobacco Use  . Smoking status: Former Smoker    Packs/day: 1.00    Years: 2.00    Pack years: 2.00    Types: Cigarettes  . Smokeless tobacco: Never Used  . Tobacco comment: quit smoking 40 yrs ago  Substance and Sexual Activity  . Alcohol use: No    Alcohol/week: 0.0 oz  . Drug use: No  . Sexual activity: Yes    Birth control/protection: None  Other Topics Concern  . Not on file  Social History Narrative   The patient is a Retail buyer.  He is married and has 2 grown children.  Lives in [place] with his wife.  Denies tobacco, alcohol or IV drug abuse or marijuana or cocaine intake.     Family History:    Family History  Problem Relation Age of Onset  . Heart attack Sister        died @ 86  . Cancer Mother        died @ 56; unknown type  . Diabetes Brother        deceased  . Cirrhosis Father        alcohol related  . Diabetes Father   . Esophageal cancer Neg Hx   . Colon cancer Neg Hx   . Pancreatic cancer Neg Hx   . Stomach cancer Neg Hx      ROS:  Please see the history of present illness.  ROS All other ROS reviewed and negative.     Physical Exam/Data:   Vitals:   04/27/17 1315 04/27/17 1330 04/27/17 1345 04/27/17 1400  BP:  126/76 112/79 131/74 122/77  Pulse: 79 75 77 81  Resp: _0 Temp:      TempSrc:      SpO2: 99% 100% 99% 100%  Weight:       No intake or output data in the  24 hours ending 04/27/17 1419 Filed Weights   04/27/17 1026  Weight: 158 lb 11.7 oz (72 kg)   Body mass index is 22.14 kg/m.  General:  Well nourished, well developed, in no acute distress HEENT: normal Lymph: no adenopathy Neck: no JVD Endocrine:  No thryomegaly Vascular: No carotid bruits; FA pulses 2+ bilaterally without bruits  Cardiac:  normal S1, S2; RRR; no murmur  lungs: Coarse breath sounds with diffuse wheezing Abd: soft, nontender, no hepatomegaly  Ext: no edema Musculoskeletal:  No deformities, BUE and BLE strength normal and equal Skin: warm and dry  Neuro:  CNs 2-12 intact, no focal abnormalities noted Psych:  Normal affect   Relevant CV Studies:  Echo 03/18/17 Study Conclusions  - Left ventricle: The cavity size was normal. Wall thickness was   increased in a pattern of moderate LVH. Systolic function was   normal. The estimated ejection fraction was in the range of 55%   to 60%. Wall motion was normal; there were no regional wall   motion abnormalities. Features are consistent with a pseudonormal   left ventricular filling pattern, with concomitant abnormal   relaxation and increased filling pressure (grade 2 diastolic   dysfunction). - Mitral valve: Severely calcified annulus. The findings are   consistent with mild stenosis. - Left atrium: The atrium was severely dilated.  Impressions:  - Normal LV systolic function; moderate diastolic dysfunction;   moderate LVH; severe MAC with mild MS (mean gradient 5 mmHg) and   trace MR; severe LAE; mild TR.  Laboratory Data:  Chemistry Recent Labs  Lab 04/27/17 1039  NA 136  K 3.4*  CL 92*  CO2 29  GLUCOSE 81  BUN 16  CREATININE 5.42*  CALCIUM 9.2  GFRNONAA 10*  GFRAA 11*  ANIONGAP 15    No results for input(s): PROT, ALBUMIN, AST, ALT, ALKPHOS, BILITOT in the last 168 hours. Hematology Recent Labs  Lab 04/27/17 1039  WBC 7.6  RBC 4.49  HGB 13.3  HCT 39.5  MCV 88.0  MCH 29.6  MCHC  33.7  RDW 14.0  PLT 222    Recent Labs  Lab 04/27/17 1104  TROPIPOC 0.02    Radiology/Studies:  Dg Chest Portable 1 View  Result Date: 04/27/2017 CLINICAL DATA:  Shortness of breath. EXAM: PORTABLE CHEST 1 VIEW COMPARISON:  03/23/2017 FINDINGS: The heart size and mediastinal contours are within normal limits. There is no evidence of pulmonary edema, consolidation, pneumothorax, nodule or pleural fluid. The visualized skeletal structures are unremarkable. IMPRESSION: No active disease. Electronically Signed   By: Aletta Edouard M.D.   On: 04/27/2017 11:04    Assessment and Plan:   1.  Chest pain with hx CAD s/p CABG (LIMA to LAD October 2018) -concerning for unstable angina -The patient continues to have ongoing chest pressure with shortness of breath during dialysis session intermittently and extreme exertion despite increasing Imdur to 120 mg daily last month. -His symptoms resolved with rest and sublingual nitroglycerin. -Troponin negative.  EKG without acute changes. -Primary team to admit for rule out.  Will review plan with MD. -Continue home medication.  2.  Hypertension -The patient was hypertensive during presentation.  Now stable.  Continue home medication.  For questions or updates, please contact [place] Please consult [email or web address] for contact info under Cardiology/STEMI.   Mahalia Longest [place], [place]  04/27/2017 2:19 PM    I have seen, examined and evaluated the patient this PM along with Leanor Kail, PA .  After reviewing all the available data and chart, we discussed the patients laboratory, study & physical findings as well as symptoms in detail. I agree with his findings, examination as well as impression recommendations as per our discussion.    Attending adjustments noted in italics.   This is a very complicated situation as a gentleman who had a Myoview stress test back in May was negative for ischemia, and then ended up having a heart  catheterization with FFR and LAD showing a severe LAD lesion and subsequently underwent CABG without lesion.  He has still had persistent notable nitroglycerin responsive chest pain Predating his CABG.  He is on the transplant list.  He barely tolerates his home medications because of hypotension causing difficulty with dialysis.  He has multiple medications listed, but he tells me that he is not on many of them.  Ultimately, I think that is chest pain is probably related to microvascular/endocardial ischemia in the setting of volume overload, however I cannot exclude persistent coronary disease that would just not treated.  As such, I think the best option is to actually proceed to the Cath Lab for diagnostic angiography to exclude new or existing CAD.  Stress test probably is not to be accurate the fact that he previously had an inaccurate stress test.  If he can exclude structural coronary artery disease etiology, then we are, microvascular ischemia and then we need to determine which medications he is on, and likely copy ACE inhibitor, amlodipine and base of beta-blocker in order to determine which medicines can be on board for blood pressure and antianginal effect.  I would increase his Imdur and probably preferentially use amlodipine at low-dose if necessary if not when he is beta-blocker at low dose. However first and foremost, we need to exclude existing coronary disease.  We talked about different options, and he understands that in order to answer the question the best  diagnostic approach is an invasive cardiac catheterization.  We have posted him for late afternoon catheterization today.    Performing MD:  Glenetta Hew, M.D., M.S.  Procedure: LEFT HEART CATHETERIZATION WITH CORONARY AND GRAFT ANGIOGRAPHY AND POSSIBLE PRODUCED CORONARY INTERVENTION  The procedure with Risks/Benefits/Alternatives and Indications was reviewed with the patient.  All questions were answered.    Risks /  Complications include, but not limited to: Death, MI, CVA/TIA, VF/VT (with defibrillation), Bradycardia (need for temporary pacer placement), bleeding / bruising / hematoma / pseudoaneurysm, vascular or coronary injury (with possible emergent CT or Vascular Surgery), adverse medication reactions, infection.  Additional risks involving the use of radiation with the possibility of radiation burns and cancer were explained in detail.  The patient voices understanding and agree to proceed.      1 held assistance we will admit him overnight postcatheterization for medication titration.   Glenetta Hew, M.D., M.S. Interventional Cardiologist   Pager # [phone] Phone # [phone] [address] [place], [place] [address]

## 2017-04-27 NOTE — Interval H&P Note (Signed)
History and Physical Interval Note:  04/27/2017 5:46 PM  Jon Anderson  has presented today for surgery, with the diagnosis of cp- Angina at Rest  The various methods of treatment have been discussed with the patient and family. After consideration of risks, benefits and other options for treatment, the patient has consented to  Procedure(s): LEFT HEART CATH AND CORS/GRAFTS ANGIOGRAPHY (N/A) with possible PERCUTANEOUS CORONARY INTERVENTION as a surgical intervention .  The patient's history has been reviewed, patient examined, no change in status, stable for surgery.  I have reviewed the patient's chart and labs.  Questions were answered to the patient's satisfaction.    Cath Lab Visit (complete for each Cath Lab visit)  Clinical Evaluation Leading to the Procedure:   ACS: Yes.    Non-ACS:    Anginal Classification: CCS IV  Anti-ischemic medical therapy: Maximal Therapy (2 or more classes of medications)  Non-Invasive Test Results: No non-invasive testing performed  Prior CABG: Previous CABG   Glenetta Hew

## 2017-04-27 NOTE — ED Notes (Signed)
Patient denies chest pain at this time but reports  "restless leg syndrome". He states that he normally takes "requip" twice daily.  Provider notified

## 2017-04-27 NOTE — Consult Note (Signed)
Cardiology Consultation:   Patient ID: Jon Anderson; [id number]; 06/20/1950   Admit date: 04/27/2017 Date of Consult: 04/27/2017  Primary Care Provider: Moreira, Roy, MD Primary Cardiologist: [facility]/ Dr. Hochrein    Patient Profile:   Jon Anderson is a 66 y.o. male with a hx of CAD s/p CABG LIMA to LAD at [facility] 01/2017, ESRD on HD, HTN, HLD, DM, chronic diastolic CHF and anemia  who is being seen today for the evaluation of chest pain at the request of Dr. Chatterjee.   He was found to have LAD disease during evaluation for renal transplant>>>Cath in Oct 2018 showed 80% long prox/mid LAD s/p single vessel CABG off pump with LIMA to LAD at [facility].  Recently admitted 12/8-12/13 for chest pain and SOB at [facility]. Found to be hypertensive and acute CHF. Troponin in not in ACS pattern. Also treated with abx for possible pneumonia.  Echo showed normal LV systolic function; moderate diastolic dysfunction; moderate LVH; severe MAC with mild MS (mean gradient 5 mmHg) and trace MR; severe LAE; mild TR. No further cardiac work up recommended.   He was again seen in ER 03/23/17 for chest pain while getting dialysis. EKG without acute changes.   The case was discussed with Dr. Hochrein, who saw the patient while he was recently in the hospital. Increased Imdur to 120 and discharge.   History of Present Illness:   Jon Anderson is presented from dialysis center for evaluation of chest pain.  Patient had a sudden onset of chest pain and shortness of breath after completion of about 2-1/2 hours of dialysis.  His chest pain improved after sublingual nitroglycerin x 1 and aspirin.  The patient is currently chest pain-free after placement of nitroglycerin ointment in the emergency department.  The patient complains of intermittent chest pain with shortness of breath during dialysis.  He did not took his Imdur today as he takes after dialysis sessions.  The patient endorsed to having chest heaviness with  shortness of breath with extreme exertion.  The patient states that he gets coldness prior to chest pain or shortness of breath.  No fever, chills or sick contact.  This is similar to prior angina when he required CABG.  He denies orthopnea, PND, syncope, lower extremity edema or melena.  His blood pressure was 161/86 on presentation.  Now normal.  Potassium 3.4.  Creatinine 5.4.  Point-of-care troponin 0.02.  Normal hemoglobin.  Chest x-ray without acute cardiopulmonary disease.  EKG shows sinus rhythm at rate of 72 bpm with PAC-personally reviewed.  Past Medical History:  Diagnosis Date  . Anemia of chronic disease   . CAD (coronary artery disease)    a.  Myoview 4/11: EF 53%, no scar or ischemia   c. MV 2012 Nl perfusion, apical thinning.  No ischemia or scar.  EF 49%, appears greater by visual estimate.;  d.  Dob stress echo 12/13:  Negative Dob stress echo. There is no evidence of ischemia.  The LVF is normal. b. Normal cors 2016.  . Carotid stenosis    a. <50% RICA, >70% LICA by duplex 07/2015  . Chronic chest pain    occ  . ESRD (end stage renal disease) on dialysis (HCC)   . GERD (gastroesophageal reflux disease)   . HTN (hypertension)    echo 3/10: EF 60%, LAE  . Hyperlipidemia   . Nephrolithiasis    "passed them all"  . Peripheral arterial disease (HCC)    a. s/p PTCA to L SFA.  .   Pneumonia   . Restless legs   . Snores    a. presumed OSA, pt has refused sleep eval in past.  . Type II diabetes mellitus (HCC)     Past Surgical History:  Procedure Laterality Date  . ANGIOPLASTY / STENTING FEMORAL Left 12/11/2013   dr berry  . AV FISTULA PLACEMENT Left 03/19/2014   Procedure: CREATION OF ARTERIOVENOUS (AV) FISTULA  LEFT UPPER ARM;  Surgeon: James D Lawson, MD;  Location: [facility] OR;  Service: Vascular;  Laterality: Left;  . CARDIAC CATHETERIZATION  2001 and 2010   . COLONOSCOPY W/ BIOPSIES AND POLYPECTOMY    . COLONOSCOPY WITH PROPOFOL N/A 08/01/2016   Procedure: COLONOSCOPY  WITH PROPOFOL;  Surgeon: Patrick Hung, MD;  Location: [facility]COPY;  Service: Endoscopy;  Laterality: N/A;  . ESOPHAGOGASTRODUODENOSCOPY (EGD) WITH PROPOFOL N/A 08/01/2016   Procedure: ESOPHAGOGASTRODUODENOSCOPY (EGD) WITH PROPOFOL;  Surgeon: Patrick Hung, MD;  Location: [facility] ENDOSCOPY;  Service: Endoscopy;  Laterality: N/A;  . FOOT FRACTURE SURGERY Right   . FRACTURE SURGERY    . INGUINAL HERNIA REPAIR Left   . LEFT HEART CATHETERIZATION WITH CORONARY ANGIOGRAM N/A 06/22/2014   Procedure: LEFT HEART CATHETERIZATION WITH CORONARY ANGIOGRAM;  Surgeon: Thomas A Kelly, MD;  Location: [facility] CATH LAB;  Service: Cardiovascular;  Laterality: N/A;  . LOWER EXTREMITY ANGIOGRAM Left 12/11/2013   Procedure: LOWER EXTREMITY ANGIOGRAM;  Surgeon: Jonathan J [name], MD;  Location: [facility] CATH LAB;  Service: Cardiovascular;  Laterality: Left;  . TONSILLECTOMY AND ADENOIDECTOMY       Home Medications:  Prior to Admission medications   Medication Sig Start Date End Date Taking? Authorizing Provider  aspirin EC 81 MG tablet Take 1 tablet (81 mg total) by mouth daily. 05/28/12  Yes Weaver, Scott T, PA-C  atorvastatin (LIPITOR) 40 MG tablet Take 1 tablet (40 mg total) by mouth daily at 6 PM. 12/13/13  Yes Hager, Bryan W, PA-C  calcium acetate (PHOSLO) 667 MG capsule Take 3 capsules (2,001 mg total) by mouth 3 (three) times daily with meals. Patient taking differently: Take 1,667-2,001 mg by mouth See admin instructions. 1334mg by mouth twice daily, 2001mg with snacks in between if needed for supplement 10/09/15  Yes Short, Mackenzie, MD  clopidogrel (PLAVIX) 75 MG tablet Take 1 tablet (75 mg total) by mouth daily with breakfast. 12/13/13  Yes Hager, Bryan W, PA-C  gabapentin (NEURONTIN) 300 MG capsule Take 300 mg by mouth 2 (two) times daily. 09/25/16  Yes [provider]  glycerin adult 2 g suppository Place 1 suppository rectally as needed for constipation.   Yes [provider]  HYDROcodone-acetaminophen (NORCO)  5-325 MG tablet Take 1-2 tablets by mouth daily as needed. Patient taking differently: Take 1 tablet by mouth every 12 (twelve) hours as needed for moderate pain.  01/16/17  Yes Xu, Naiping M, MD  isosorbide mononitrate (IMDUR) 60 MG 24 hr tablet Take 2 tablets (120 mg total) by mouth daily. 03/23/17  Yes Floyd, Dan, DO  metoprolol tartrate (LOPRESSOR) 25 MG tablet Take 1 tablet (25 mg total) by mouth 2 (two) times daily. 03/22/17  Yes Alekh, Kshitiz, MD  multivitamin (RENA-VIT) TABS tablet Take 1 tablet by mouth at bedtime. 06/23/14  Yes Joseph, Preetha, MD  nitroGLYCERIN (NITROSTAT) 0.4 MG SL tablet Place 1 tablet (0.4 mg total) under the tongue every 5 (five) minutes as needed for chest pain. 03/20/12  Yes Barrett, Rhonda G, PA-C  pantoprazole (PROTONIX) 40 MG tablet Take 40 mg by mouth daily.   Yes [provider]  polyethylene glycol (MIRALAX / GLYCOLAX) packet Take 17 g by mouth daily as needed for moderate constipation. 03/22/17  Yes Alekh, Kshitiz, MD  rOPINIRole (REQUIP) 0.5 MG tablet Take 0.5 mg by mouth See admin instructions. Pt takes every night at bedtime and three days per week at dialysis   Yes [provider]  tamsulosin (FLOMAX) 0.4 MG CAPS capsule Take 0.4 mg by mouth daily.   Yes [provider]  amLODipine (NORVASC) 5 MG tablet Take 1 tablet (5 mg total) by mouth at bedtime. Patient not taking: Reported on 04/27/2017 03/22/17   Alekh, Kshitiz, MD  lisinopril (PRINIVIL,ZESTRIL) 20 MG tablet Take 20 mg by mouth at bedtime.    [provider]  traMADol (ULTRAM) 50 MG tablet Take 1 tablet (50 mg total) every 12 (twelve) hours as needed by mouth. Patient not taking: Reported on 04/27/2017 02/23/17   Xu, Naiping M, MD    Inpatient Medications: Scheduled Meds: . aspirin  324 mg Oral Once  .  morphine injection  4 mg Intravenous Once   Continuous Infusions:  PRN Meds:   Allergies:    Allergies  Allergen Reactions  . Kiwi Extract Swelling     Facial swelling   . Tape Other (See Comments)    Plastic tape-blistering    Social History:   Social History   Socioeconomic History  . Marital status: Married    Spouse name: Not on file  . Number of children: Not on file  . Years of education: Not on file  . Highest education level: Not on file  Social Needs  . Financial resource strain: Not on file  . Food insecurity - worry: Not on file  . Food insecurity - inability: Not on file  . Transportation needs - medical: Not on file  . Transportation needs - non-medical: Not on file  Occupational History  . Not on file  Tobacco Use  . Smoking status: Former Smoker    Packs/day: 1.00    Years: 2.00    Pack years: 2.00    Types: Cigarettes  . Smokeless tobacco: Never Used  . Tobacco comment: quit smoking 40 yrs ago  Substance and Sexual Activity  . Alcohol use: No    Alcohol/week: 0.0 oz  . Drug use: No  . Sexual activity: Yes    Birth control/protection: None  Other Topics Concern  . Not on file  Social History Narrative   The patient is a janitor.  He is married and has 2 grown children.  Lives in [place] with his wife.  Denies tobacco, alcohol or IV drug abuse or marijuana or cocaine intake.     Family History:    Family History  Problem Relation Age of Onset  . Heart attack Sister        died @ 52  . Cancer Mother        died @ 87; unknown type  . Diabetes Brother        deceased  . Cirrhosis Father        alcohol related  . Diabetes Father   . Esophageal cancer Neg Hx   . Colon cancer Neg Hx   . Pancreatic cancer Neg Hx   . Stomach cancer Neg Hx      ROS:  Please see the history of present illness.  ROS All other ROS reviewed and negative.     Physical Exam/Data:   Vitals:   04/27/17 1315 04/27/17 1330 04/27/17 1345 04/27/17 1400  BP:   126/76 112/79 131/74 122/77  Pulse: 79 75 77 81  Resp: 15 17 18 19  Temp:      TempSrc:      SpO2: 99% 100% 99% 100%  Weight:       No intake or output data in the  24 hours ending 04/27/17 1419 Filed Weights   04/27/17 1026  Weight: 158 lb 11.7 oz (72 kg)   Body mass index is 22.14 kg/m.  General:  Well nourished, well developed, in no acute distress HEENT: normal Lymph: no adenopathy Neck: no JVD Endocrine:  No thryomegaly Vascular: No carotid bruits; FA pulses 2+ bilaterally without bruits  Cardiac:  normal S1, S2; RRR; no murmur  lungs: Coarse breath sounds with diffuse wheezing Abd: soft, nontender, no hepatomegaly  Ext: no edema Musculoskeletal:  No deformities, BUE and BLE strength normal and equal Skin: warm and dry  Neuro:  CNs 2-12 intact, no focal abnormalities noted Psych:  Normal affect   Relevant CV Studies:  Echo 03/18/17 Study Conclusions  - Left ventricle: The cavity size was normal. Wall thickness was   increased in a pattern of moderate LVH. Systolic function was   normal. The estimated ejection fraction was in the range of 55%   to 60%. Wall motion was normal; there were no regional wall   motion abnormalities. Features are consistent with a pseudonormal   left ventricular filling pattern, with concomitant abnormal   relaxation and increased filling pressure (grade 2 diastolic   dysfunction). - Mitral valve: Severely calcified annulus. The findings are   consistent with mild stenosis. - Left atrium: The atrium was severely dilated.  Impressions:  - Normal LV systolic function; moderate diastolic dysfunction;   moderate LVH; severe MAC with mild MS (mean gradient 5 mmHg) and   trace MR; severe LAE; mild TR.  Laboratory Data:  Chemistry Recent Labs  Lab 04/27/17 1039  NA 136  K 3.4*  CL 92*  CO2 29  GLUCOSE 81  BUN 16  CREATININE 5.42*  CALCIUM 9.2  GFRNONAA 10*  GFRAA 11*  ANIONGAP 15    No results for input(s): PROT, ALBUMIN, AST, ALT, ALKPHOS, BILITOT in the last 168 hours. Hematology Recent Labs  Lab 04/27/17 1039  WBC 7.6  RBC 4.49  HGB 13.3  HCT 39.5  MCV 88.0  MCH 29.6  MCHC  33.7  RDW 14.0  PLT 222    Recent Labs  Lab 04/27/17 1104  TROPIPOC 0.02    Radiology/Studies:  Dg Chest Portable 1 View  Result Date: 04/27/2017 CLINICAL DATA:  Shortness of breath. EXAM: PORTABLE CHEST 1 VIEW COMPARISON:  03/23/2017 FINDINGS: The heart size and mediastinal contours are within normal limits. There is no evidence of pulmonary edema, consolidation, pneumothorax, nodule or pleural fluid. The visualized skeletal structures are unremarkable. IMPRESSION: No active disease. Electronically Signed   By: Glenn  Yamagata M.D.   On: 04/27/2017 11:04    Assessment and Plan:   1.  Chest pain with hx CAD s/p CABG (LIMA to LAD October 2018) -concerning for unstable angina -The patient continues to have ongoing chest pressure with shortness of breath during dialysis session intermittently and extreme exertion despite increasing Imdur to 120 mg daily last month. -His symptoms resolved with rest and sublingual nitroglycerin. -Troponin negative.  EKG without acute changes. -Primary team to admit for rule out.  Will review plan with MD. -Continue home medication.  2.  Hypertension -The patient was hypertensive during presentation.  Now stable.  Continue home medication.     For questions or updates, please contact [facility] Please consult [email or web address] for contact info under Cardiology/STEMI.   Signed, Bhavinkumar Bhagat, PA  04/27/2017 2:19 PM    I have seen, examined and evaluated the patient this PM along with Bhavinkumar Bhagat, PA .  After reviewing all the available data and chart, we discussed the patients laboratory, study & physical findings as well as symptoms in detail. I agree with his findings, examination as well as impression recommendations as per our discussion.    Attending adjustments noted in italics.   This is a very complicated situation as a gentleman who had a Myoview stress test back in May was negative for ischemia, and then ended up having a heart  catheterization with FFR and LAD showing a severe LAD lesion and subsequently underwent CABG without lesion.  He has still had persistent notable nitroglycerin responsive chest pain Predating his CABG.  He is on the transplant list.  He barely tolerates his home medications because of hypotension causing difficulty with dialysis.  He has multiple medications listed, but he tells me that he is not on many of them.  Ultimately, I think that is chest pain is probably related to microvascular/endocardial ischemia in the setting of volume overload, however I cannot exclude persistent coronary disease that would just not treated.  As such, I think the best option is to actually proceed to the Cath Lab for diagnostic angiography to exclude new or existing CAD.  Stress test probably is not to be accurate the fact that he previously had an inaccurate stress test.  If he can exclude structural coronary artery disease etiology, then we are, microvascular ischemia and then we need to determine which medications he is on, and likely copy ACE inhibitor, amlodipine and base of beta-blocker in order to determine which medicines can be on board for blood pressure and antianginal effect.  I would increase his Imdur and probably preferentially use amlodipine at low-dose if necessary if not when he is beta-blocker at low dose. However first and foremost, we need to exclude existing coronary disease.  We talked about different options, and he understands that in order to answer the question the best  diagnostic approach is an invasive cardiac catheterization.  We have posted him for late afternoon catheterization today.    Performing MD:  David Harding, M.D., M.S.  Procedure: LEFT HEART CATHETERIZATION WITH CORONARY AND GRAFT ANGIOGRAPHY AND POSSIBLE PRODUCED CORONARY INTERVENTION  The procedure with Risks/Benefits/Alternatives and Indications was reviewed with the patient.  All questions were answered.    Risks /  Complications include, but not limited to: Death, MI, CVA/TIA, VF/VT (with defibrillation), Bradycardia (need for temporary pacer placement), bleeding / bruising / hematoma / pseudoaneurysm, vascular or coronary injury (with possible emergent CT or Vascular Surgery), adverse medication reactions, infection.  Additional risks involving the use of radiation with the possibility of radiation burns and cancer were explained in detail.  The patient voices understanding and agree to proceed.      1 held assistance we will admit him overnight postcatheterization for medication titration.   David Harding, M.D., M.S. Interventional Cardiologist   Pager # [phone] Phone # [phone] [address] [place], [place] [address]   

## 2017-04-27 NOTE — ED Notes (Signed)
Patient denies pain and is resting comfortably.  

## 2017-04-27 NOTE — H&P (Signed)
History and Physical:    Jon Anderson   ALP:[id number] DOB: 1950-12-24 DOA: 04/27/2017  Referring MD/provider: PA Pisciotta PCP: [name], MD   Patient coming from: Home  Chief Complaint: Chest pain  History of Present Illness:   Jon Anderson is an 67 y.o. male with past medical history significant for end-stage renal disease and coronary artery disease who is recently status post CABG 3 months ago who was in his usual state of poor health until earlier this morning when he had substernal chest pain and shortness of breath during dialysis. Patient is an extremely poor historian and finds it hard to be specific with dates or symptoms. Nonetheless he states that he's had intermittent chest pain both before and after his CABG both with exertion and at rest. Of note however patient has had increased episodes of chest pain during dialysis over the past couple of months. This has not changed with the CABG. This morning patient states that his chest pain was associated with some nausea no vomiting and that he thought that his blood pressure dropped. This feels similar to the pain he had before his CABG.  Patient denies any fevers or chills or cough. No change in his baseline exercise tolerance which is minimal. No palpitations no syncope or presyncope. Patient notes that he is mostly sedentary. No melena or hematemesis.  Patient is also very concerned about his restless leg syndrome which apparently is much worse during dialysis. He states he finds it difficult to rest due to jumpy feelings in his legs.  ED Course:  The patient was treated with aspirin, nitroglycerin paste and morphine and admitted for chest pain rule out.  ROS:   ROS   Review of Systems: General: No fever, chills, weight changes Skin: No rashes, lesions, wounds Eyes: no discharge, redness, pain HENT: no ear pain, hearing loss, drainage, tinnitus Endocrine: Positive cold intolerance times several years.    Respiratory: No cough,,  hemoptysis Cardiovascular: No palpitations, positive chest pain as noted above GI: No nausea, vomiting, diarrhea, constipation CNS: No numbness, dizziness, headache Blood/lymphatics: No easy bruising, bleeding    Past Medical History:   Past Medical History:  Diagnosis Date  . Anemia of chronic disease   . CAD (coronary artery disease)    a.  Myoview 4/11: EF 53%, no scar or ischemia   c. MV 2012 Nl perfusion, apical thinning.  No ischemia or scar.  EF 49%, appears greater by visual estimate.;  d.  Dob stress echo 12/13:  Negative Dob stress echo. There is no evidence of ischemia.  The LVF is normal. b. Normal cors 2016.  . Carotid stenosis    a. <35% RICA, >32% LICA by duplex 12/9240  . Chronic chest pain    occ  . ESRD (end stage renal disease) on dialysis ([place])   . GERD (gastroesophageal reflux disease)   . HTN (hypertension)    echo 3/10: EF 60%, LAE  . Hyperlipidemia   . Nephrolithiasis    "passed them all"  . Peripheral arterial disease ([place])    a. s/p PTCA to L SFA.  Marland Kitchen Pneumonia   . Restless legs   . Snores    a. presumed OSA, pt has refused sleep eval in past.  . Type II diabetes mellitus ([place])     Past Surgical History:   Past Surgical History:  Procedure Laterality Date  . ANGIOPLASTY / STENTING FEMORAL Left 12/11/2013   dr berry  . AV FISTULA PLACEMENT Left  03/19/2014   Procedure: CREATION OF ARTERIOVENOUS (AV) FISTULA  LEFT UPPER ARM;  Surgeon: Mal Misty, MD;  Location: [name];  Service: Vascular;  Laterality: Left;  . CARDIAC CATHETERIZATION  2001 and 2010   . COLONOSCOPY W/ BIOPSIES AND POLYPECTOMY    . COLONOSCOPY WITH PROPOFOL N/A 08/01/2016   Procedure: COLONOSCOPY WITH PROPOFOL;  Surgeon: Carol Ada, MD;  Location: [facility];  Service: Endoscopy;  Laterality: N/A;  . ESOPHAGOGASTRODUODENOSCOPY (EGD) WITH PROPOFOL N/A 08/01/2016   Procedure: ESOPHAGOGASTRODUODENOSCOPY (EGD) WITH PROPOFOL;  Surgeon: Carol Ada, MD;   Location: [facility];  Service: Endoscopy;  Laterality: N/A;  . FOOT FRACTURE SURGERY Right   . FRACTURE SURGERY    . INGUINAL HERNIA REPAIR Left   . LEFT HEART CATHETERIZATION WITH CORONARY ANGIOGRAM N/A 06/22/2014   Procedure: LEFT HEART CATHETERIZATION WITH CORONARY ANGIOGRAM;  Surgeon: Troy Sine, MD;  Location: [facility] CATH LAB;  Service: Cardiovascular;  Laterality: N/A;  . LOWER EXTREMITY ANGIOGRAM Left 12/11/2013   Procedure: LOWER EXTREMITY ANGIOGRAM;  Surgeon: Lorretta Harp, MD;  Location: [facility] CATH LAB;  Service: Cardiovascular;  Laterality: Left;  . TONSILLECTOMY AND ADENOIDECTOMY      Social History:   Social History   Socioeconomic History  . Marital status: Married    Spouse name: Not on file  . Number of children: Not on file  . Years of education: Not on file  . Highest education level: Not on file  Social Needs  . Financial resource strain: Not on file  . Food insecurity - worry: Not on file  . Food insecurity - inability: Not on file  . Transportation needs - medical: Not on file  . Transportation needs - non-medical: Not on file  Occupational History  . Not on file  Tobacco Use  . Smoking status: Former Smoker    Packs/day: 1.00    Years: 2.00    Pack years: 2.00    Types: Cigarettes  . Smokeless tobacco: Never Used  . Tobacco comment: quit smoking 40 yrs ago  Substance and Sexual Activity  . Alcohol use: No    Alcohol/week: 0.0 oz  . Drug use: No  . Sexual activity: Yes    Birth control/protection: None  Other Topics Concern  . Not on file  Social History Narrative   The patient is a Retail buyer.  He is married and has 2 grown children.  Lives in [place] with his wife.  Denies tobacco, alcohol or IV drug abuse or marijuana or cocaine intake.     Allergies   Kiwi extract and Tape  Family history:   Family History  Problem Relation Age of Onset  . Heart attack Sister        died @ 30  . Cancer Mother        died @ 42; unknown type  . Diabetes  Brother        deceased  . Cirrhosis Father        alcohol related  . Diabetes Father   . Esophageal cancer Neg Hx   . Colon cancer Neg Hx   . Pancreatic cancer Neg Hx   . Stomach cancer Neg Hx     Current Medications:   Prior to Admission medications   Medication Sig Start Date End Date Taking? Authorizing Provider  amLODipine (NORVASC) 5 MG tablet Take 1 tablet (5 mg total) by mouth at bedtime. 03/22/17   Aline August, MD  aspirin EC 81 MG tablet Take 1 tablet (81 mg total)  by mouth daily. 05/28/12   Richardson Dopp T, PA-C  atorvastatin (LIPITOR) 40 MG tablet Take 1 tablet (40 mg total) by mouth daily at 6 PM. 12/13/13   Brett Canales, PA-C  calcium acetate (PHOSLO) 667 MG capsule Take 3 capsules (2,001 mg total) by mouth 3 (three) times daily with meals. Patient taking differently: Take 1,667-2,001 mg by mouth See admin instructions. 1334mg  by mouth twice daily, 2001mg  with snacks in between if needed for supplement 10/09/15   Janece Canterbury, MD  clopidogrel (PLAVIX) 75 MG tablet Take 1 tablet (75 mg total) by mouth daily with breakfast. 12/13/13   Brett Canales, PA-C  gabapentin (NEURONTIN) 300 MG capsule Take 300 mg by mouth 2 (two) times daily. 09/25/16   [provider]  HYDROcodone-acetaminophen (NORCO) 5-325 MG tablet Take 1-2 tablets by mouth daily as needed. Patient taking differently: Take 1 tablet by mouth every 12 (twelve) hours as needed for moderate pain.  01/16/17   Leandrew Koyanagi, MD  isosorbide mononitrate (IMDUR) 60 MG 24 hr tablet Take 2 tablets (120 mg total) by mouth daily. 03/23/17   Deno Etienne, DO  lisinopril (PRINIVIL,ZESTRIL) 20 MG tablet Take 20 mg by mouth at bedtime.    [provider]  metoprolol tartrate (LOPRESSOR) 25 MG tablet Take 1 tablet (25 mg total) by mouth 2 (two) times daily. 03/22/17   Aline August, MD  multivitamin (RENA-VIT) TABS tablet Take 1 tablet by mouth at bedtime. 06/23/14   Domenic Polite, MD  nitroGLYCERIN (NITROSTAT)  0.4 MG SL tablet Place 1 tablet (0.4 mg total) under the tongue every 5 (five) minutes as needed for chest pain. 03/20/12   Barrett, Evelene Croon, PA-C  pantoprazole (PROTONIX) 40 MG tablet Take 40 mg by mouth daily.    [provider]  polyethylene glycol (MIRALAX / GLYCOLAX) packet Take 17 g by mouth daily as needed for moderate constipation. 03/22/17   Aline August, MD  rOPINIRole (REQUIP) 0.5 MG tablet Take 0.5 mg by mouth See admin instructions. Pt takes every night at bedtime and three days per week at dialysis    [provider]  tamsulosin (FLOMAX) 0.4 MG CAPS capsule Take 0.4 mg by mouth daily.    [provider]  traMADol (ULTRAM) 50 MG tablet Take 1 tablet (50 mg total) every 12 (twelve) hours as needed by mouth. Patient taking differently: Take 50 mg by mouth every 6 (six) hours as needed for moderate pain.  02/23/17   Leandrew Koyanagi, MD    Physical Exam:   Vitals:   04/27/17 1645 04/27/17 1700 04/27/17 1715 04/27/17 1730  BP: 105/68 109/67 118/76 129/76  Pulse: 65 68 63 70  Resp: 18 12 14 16   Temp:      TempSrc:      SpO2: 100% 99% 100% 100%  Weight:      Height:         Physical Exam: Blood pressure 129/76, pulse 70, temperature (!) 97.5 F (36.4 C), temperature source Oral, resp. rate 16, height 5\' 11"  (1.803 m), weight 70.3 kg (155 lb), SpO2 100 %. Gen: Chronically ill-appearing man in skull Huddled under blankets who is friendly and cooperative. Eyes: Sclerae anicteric, muddy brown. Conjunctiva mildly injected. Chest: Moderately good air entry bilaterally with no adventitious sounds.  CV: Distant, regular, no audible murmurs. Abdomen: NABS, soft, nondistended, nontender. No tenderness to light or deep palpation. No rebound, no guarding. Extremities: No edema. Fistula with thrill Skin: Warm and dry. No rashes, lesions or  wounds. Neuro: Alert and oriented times 3;  Psych: Patient is cooperative, logical and coherent.  Data Review:     Labs: Basic Metabolic Panel: Recent Labs  Lab 04/27/17 1039  NA 136  K 3.4*  CL 92*  CO2 29  GLUCOSE 81  BUN 16  CREATININE 5.42*  CALCIUM 9.2   Liver Function Tests: No results for input(s): AST, ALT, ALKPHOS, BILITOT, PROT, ALBUMIN in the last 168 hours. No results for input(s): LIPASE, AMYLASE in the last 168 hours. No results for input(s): AMMONIA in the last 168 hours. CBC: Recent Labs  Lab 04/27/17 1039  WBC 7.6  HGB 13.3  HCT 39.5  MCV 88.0  PLT 222   Cardiac Enzymes: No results for input(s): CKTOTAL, CKMB, CKMBINDEX, TROPONINI in the last 168 hours.  BNP (last 3 results) No results for input(s): PROBNP in the last 8760 hours. CBG: No results for input(s): GLUCAP in the last 168 hours.  Urinalysis    Component Value Date/Time   COLORURINE YELLOW 03/18/2017 1721   APPEARANCEUR CLEAR 03/18/2017 1721   LABSPEC 1.007 03/18/2017 1721   PHURINE 9.0 (H) 03/18/2017 1721   GLUCOSEU 150 (A) 03/18/2017 1721   HGBUR NEGATIVE 03/18/2017 1721   BILIRUBINUR NEGATIVE 03/18/2017 1721   KETONESUR NEGATIVE 03/18/2017 1721   PROTEINUR >=300 (A) 03/18/2017 1721   UROBILINOGEN 0.2 06/18/2014 2308   NITRITE NEGATIVE 03/18/2017 1721   LEUKOCYTESUR NEGATIVE 03/18/2017 1721      Radiographic Studies: Dg Chest Portable 1 View  Result Date: 04/27/2017 CLINICAL DATA:  Shortness of breath. EXAM: PORTABLE CHEST 1 VIEW COMPARISON:  03/23/2017 FINDINGS: The heart size and mediastinal contours are within normal limits. There is no evidence of pulmonary edema, consolidation, pneumothorax, nodule or pleural fluid. The visualized skeletal structures are unremarkable. IMPRESSION: No active disease. Electronically Signed   By: Aletta Edouard M.D.   On: 04/27/2017 11:04     EKG: Independently reviewed. Sinus rhythm at 75 with frequent PACs. Q V1 through V4. No acute ST-T wave changes   Assessment/Plan:   Active Problems:   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/[facility]   ESRD (end stage renal  disease) on dialysis ([place])   Type II diabetes mellitus ([place])   Chest pain in adult   Restless leg syndrome, uncontrolled   CHEST PAIN/CAD I'm concerned about ongoing chest pain particularly worsened during hemodialysis in a patient who is recently status post CABG. EKG is without any acute changes. However will request cardiology to evaluate the patient given very high risk profile.  Continue Plavix, isosorbide, metoprolol, amlodipine and lisinopril   ESRD Discussed with renal, no need to continue dialysis until usual scheduled hemodialysis on Monday.  HTN Continue metoprolol, amlodipine and lisinopril.   DIASTOLIC DYSFUNCTION  No evidence of heart failure at present. Patient appears to be euvolemic. Lungs are clear.  RESTLESS LEG SYNDROME Will increase ropinirole 2 twice a day. Continue gabapentin, he does have her increase that as well, this can be done as an outpatient as warranted.     Other information:   DVT prophylaxis: Lovenox ordered. Code Status: Full code. Family Communication: Patient states there is no need to call family at present.  Disposition Plan: Home  Consults called: Cardiology  Admission status: Observation   The medical decision making on this patient was of high complexity and the patient is at high risk for clinical deterioration, therefore this is a level 3 visit.    Dewaine Oats Derek Jack [facility] Pager [phone] Cell: [phone]  If 7PM-7AM, please contact night-coverage [email or web address] Password [facility] 04/27/2017, 5:42 PM

## 2017-04-27 NOTE — ED Notes (Addendum)
Morphine held at this time-patient is fast asleep

## 2017-04-27 NOTE — ED Notes (Signed)
Pt states chest pain started last night-- did not sleep all night-- hurt prior to going to dialysis

## 2017-04-28 DIAGNOSIS — Z951 Presence of aortocoronary bypass graft: Secondary | ICD-10-CM | POA: Diagnosis not present

## 2017-04-28 DIAGNOSIS — Z992 Dependence on renal dialysis: Secondary | ICD-10-CM | POA: Diagnosis not present

## 2017-04-28 DIAGNOSIS — N186 End stage renal disease: Secondary | ICD-10-CM | POA: Diagnosis not present

## 2017-04-28 DIAGNOSIS — R079 Chest pain, unspecified: Secondary | ICD-10-CM | POA: Diagnosis not present

## 2017-04-28 LAB — GLUCOSE, CAPILLARY
Glucose-Capillary: 65 mg/dL (ref 65–99)
Glucose-Capillary: 141 mg/dL — ABNORMAL HIGH (ref 65–99)

## 2017-04-28 LAB — TROPONIN I: Troponin I: 0.03 ng/mL (ref <0.03)

## 2017-04-28 MED ORDER — METOPROLOL TARTRATE 25 MG PO TABS: 12.5000 mg | ORAL_TABLET | Freq: Two times a day (BID) | ORAL | 0 refills | Status: DC

## 2017-04-28 NOTE — Progress Notes (Signed)
Hypoglycemic Event  CBG: 65 (06:07)  Treatment: 15 GM carbohydrate snack  Symptoms: None  Follow-up CBG: Time:06:33 CBG Result: 141  Possible Reasons for Event: Unknown      Jon Anderson

## 2017-04-28 NOTE — Discharge Summary (Signed)
Physician Discharge Summary  Jon Anderson QIO:[id number] DOB: March 17, 1951 DOA: 04/27/2017  PCP: [name], MD  Admit date: 04/27/2017 Discharge date: 04/28/2017  Admitted From:home Disposition:home  Recommendations for Outpatient Follow-up:  1. Follow up with PCP in 1-2 weeks 2. Please obtain BMP/CBC in one week 3. Please continue to follow-up with cardiologist and nephrologist.  Home Health: No Equipment/Devices:no Discharge Condition:stable CODE STATUS:full code Diet recommendation:heart healthy  Brief/Interim Summary: 67 year old male with history of CAD status post CABG, ESRD on hemodialysis Monday when my diabetes, chronic diastolic congestive heart failure, anemia of chronic kidney disease presented to the hospital with chest pain.  Patient was evaluated by cardiologist.  He underwent cardiac cath which showed patent LIMA to LAD, no findings to explain the symptoms.  Patient may have microvascular angina.  Patient is on aspirin Plavix, statin, Imdur.  Recommended to continue medication.  Patient denies chest pain, shortness of breath, nausea vomiting.  He feels good.  His next hemodialysis on coming Monday.  I recommended patient to monitor blood pressure twice a day.  He verbalized understanding.  Patient is medically stable on discharge.  Resume home medication as below.    Discharge Diagnoses:  Active Problems:   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/[facility]   ESRD (end stage renal disease) on dialysis ([facility])   Type II diabetes mellitus ([place])   Chest pain in adult   Restless leg syndrome, uncontrolled    Discharge Instructions  Discharge Instructions    Call MD for:  difficulty breathing, headache or visual disturbances   Complete by:  As directed    Call MD for:  extreme fatigue   Complete by:  As directed    Call MD for:  hives   Complete by:  As directed    Call MD for:  persistant dizziness or light-headedness   Complete by:  As directed    Call MD for:  persistant nausea  and vomiting   Complete by:  As directed    Call MD for:  severe uncontrolled pain   Complete by:  As directed    Call MD for:  temperature >100.4   Complete by:  As directed    Diet - low sodium heart healthy   Complete by:  As directed    Increase activity slowly   Complete by:  As directed      Allergies as of 04/28/2017      Reactions   Kiwi Extract Swelling   Facial swelling    Tape Other (See Comments)   Plastic tape-blistering      Medication List    STOP taking these medications   amLODipine 5 MG tablet Commonly known as:  NORVASC   lisinopril 20 MG tablet Commonly known as:  PRINIVIL,ZESTRIL   traMADol 50 MG tablet Commonly known as:  ULTRAM     TAKE these medications   aspirin EC 81 MG tablet Take 1 tablet (81 mg total) by mouth daily.   atorvastatin 40 MG tablet Commonly known as:  LIPITOR Take 1 tablet (40 mg total) by mouth daily at 6 PM.   calcium acetate 667 MG capsule Commonly known as:  PHOSLO Take 3 capsules (2,001 mg total) by mouth 3 (three) times daily with meals. What changed:    how much to take  when to take this  additional instructions   clopidogrel 75 MG tablet Commonly known as:  PLAVIX Take 1 tablet (75 mg total) by mouth daily with breakfast.   gabapentin 300 MG capsule Commonly  known as:  NEURONTIN Take 300 mg by mouth 2 (two) times daily.   glycerin adult 2 g suppository Place 1 suppository rectally as needed for constipation.   HYDROcodone-acetaminophen 5-325 MG tablet Commonly known as:  NORCO Take 1-2 tablets by mouth daily as needed. What changed:    how much to take  when to take this  reasons to take this   isosorbide mononitrate 60 MG 24 hr tablet Commonly known as:  IMDUR Take 2 tablets (120 mg total) by mouth daily.   metoprolol tartrate 25 MG tablet Commonly known as:  LOPRESSOR Take 0.5 tablets (12.5 mg total) by mouth 2 (two) times daily. What changed:  how much to take   multivitamin Tabs  tablet Take 1 tablet by mouth at bedtime.   nitroGLYCERIN 0.4 MG SL tablet Commonly known as:  NITROSTAT Place 1 tablet (0.4 mg total) under the tongue every 5 (five) minutes as needed for chest pain.   pantoprazole 40 MG tablet Commonly known as:  PROTONIX Take 40 mg by mouth daily.   polyethylene glycol packet Commonly known as:  MIRALAX / GLYCOLAX Take 17 g by mouth daily as needed for moderate constipation.   rOPINIRole 0.5 MG tablet Commonly known as:  REQUIP Take 0.5 mg by mouth See admin instructions. Pt takes every night at bedtime and three days per week at dialysis   tamsulosin 0.4 MG Caps capsule Commonly known as:  FLOMAX Take 0.4 mg by mouth daily.      Follow-up Information    [name], MD. Schedule an appointment as soon as possible for a visit in 1 week(s).   Specialty:  Internal Medicine Contact information: [phone] [place] [address] [phone]          Allergies  Allergen Reactions  . Kiwi Extract Swelling    Facial swelling   . Tape Other (See Comments)    Plastic tape-blistering    Consultations: Cardiology  Procedures/Studies: Cardiac cath  Subjective: Seen and examined at bedside.  Reported feeling good.  Denies headache, dizziness, nausea vomiting chest pain shortness of breath.  Discharge Exam: Vitals:   04/28/17 0730 04/28/17 0800  BP: (!) 101/45 (!) 105/48  Pulse: 93   Resp: (!) 21   Temp: 98.2 F (36.8 C)   SpO2: 95%    Vitals:   04/28/17 0400 04/28/17 0600 04/28/17 0730 04/28/17 0800  BP: (!) 87/50 (!) 98/53 (!) 101/45 (!) 105/48  Pulse:   93   Resp: (!) 24 19 (!) 21   Temp: 98 F (36.7 C)  98.2 F (36.8 C)   TempSrc: Oral  Oral   SpO2: 100% 100% 95%   Weight: 72.3 kg (159 lb 6.3 oz)     Height:        General: Pt is alert, awake, not in acute distress Cardiovascular: RRR, S1/S2 +, no rubs, no gallops Respiratory: CTA bilaterally, no wheezing, no rhonchi Abdominal: Soft, NT, ND, bowel sounds  + Extremities: no edema, no cyanosis    The results of significant diagnostics from this hospitalization (including imaging, microbiology, ancillary and laboratory) are listed below for reference.     Microbiology: No results found for this or any previous visit (from the past 240 hour(s)).   Labs: BNP (last 3 results) Recent Labs    03/17/17 1124  BNP 9,147.8*   Basic Metabolic Panel: Recent Labs  Lab 04/27/17 1039 04/27/17 1949  NA 136  --   K 3.4*  --   CL 92*  --  CO2 29  --   GLUCOSE 81  --   BUN 16  --   CREATININE 5.42* 6.79*  CALCIUM 9.2  --    Liver Function Tests: No results for input(s): AST, ALT, ALKPHOS, BILITOT, PROT, ALBUMIN in the last 168 hours. No results for input(s): LIPASE, AMYLASE in the last 168 hours. No results for input(s): AMMONIA in the last 168 hours. CBC: Recent Labs  Lab 04/27/17 1039 04/27/17 1949  WBC 7.6 5.0  HGB 13.3 13.2  HCT 39.5 39.9  MCV 88.0 90.3  PLT 222 187   Cardiac Enzymes: Recent Labs  Lab 04/27/17 1949 04/27/17 2227 04/28/17 0236  TROPONINI 0.03* 0.04* 0.03*   BNP: Invalid input(s): POCBNP CBG: Recent Labs  Lab 04/27/17 1940 04/27/17 2021 04/28/17 0607 04/28/17 0633  GLUCAP 57* 100* 65 141*   D-Dimer No results for input(s): DDIMER in the last 72 hours. Hgb A1c No results for input(s): HGBA1C in the last 72 hours. Lipid Profile No results for input(s): CHOL, HDL, LDLCALC, TRIG, CHOLHDL, LDLDIRECT in the last 72 hours. Thyroid function studies No results for input(s): TSH, T4TOTAL, T3FREE, THYROIDAB in the last 72 hours.  Invalid input(s): FREET3 Anemia work up No results for input(s): VITAMINB12, FOLATE, FERRITIN, TIBC, IRON, RETICCTPCT in the last 72 hours. Urinalysis    Component Value Date/Time   COLORURINE YELLOW 03/18/2017 1721   APPEARANCEUR CLEAR 03/18/2017 1721   LABSPEC 1.007 03/18/2017 1721   PHURINE 9.0 (H) 03/18/2017 1721   GLUCOSEU 150 (A) 03/18/2017 1721   HGBUR NEGATIVE  03/18/2017 1721   BILIRUBINUR NEGATIVE 03/18/2017 1721   KETONESUR NEGATIVE 03/18/2017 1721   PROTEINUR >=300 (A) 03/18/2017 1721   UROBILINOGEN 0.2 06/18/2014 2308   NITRITE NEGATIVE 03/18/2017 1721   LEUKOCYTESUR NEGATIVE 03/18/2017 1721   Sepsis Labs Invalid input(s): PROCALCITONIN,  WBC,  LACTICIDVEN Microbiology No results found for this or any previous visit (from the past 240 hour(s)).   Time coordinating discharge: 28 minutes  SIGNED:   Rosita Fire, MD  [facility] 04/28/2017, 10:54 AM  If 7PM-7AM, please contact night-coverage [email or web address] Password [email or web address]

## 2017-04-28 NOTE — Progress Notes (Signed)
Hypoglycemic Event  CBG: 57 (19:40)  Treatment: 15 GM carbohydrate snack  Symptoms: None  Follow-up CBG: Time: 20:21 CBG Result:100  Possible Reasons for Event: NPO       [place] Tiu

## 2017-04-28 NOTE — Care Management Obs Status (Signed)
[place] NOTIFICATION   Patient Details  Name: Jon Anderson MRN: [id number] Date of Birth: October 24, 1950   Medicare Observation Status Notification Given:  Yes    Zenon Mayo, RN 04/28/2017, 10:51 AM

## 2017-04-28 NOTE — Progress Notes (Signed)
Progress Note  Patient Name: Jon Anderson Date of Encounter: 04/28/2017  Primary Cardiologist: [facility]/ Dr. Percival Spanish  Subjective   No chest pain or shortness of breath.   Inpatient Medications    Scheduled Meds: . aspirin  324 mg Oral Once  . aspirin EC  81 mg Oral Daily  . atorvastatin  40 mg Oral q1800  . calcium acetate  1,334 mg Oral TID WC  . clopidogrel  75 mg Oral Q breakfast  . gabapentin  300 mg Oral BID  . heparin  5,000 Units Subcutaneous Q8H  . isosorbide mononitrate  120 mg Oral Daily  . metoprolol tartrate  12.5 mg Oral BID  . multivitamin  1 tablet Oral QHS  . pantoprazole  40 mg Oral Daily  . rOPINIRole  0.5 mg Oral QHS  . sodium chloride flush  3 mL Intravenous Q12H  . tamsulosin  0.4 mg Oral Daily   Continuous Infusions: . sodium chloride     PRN Meds: sodium chloride, acetaminophen, Glycerin (Adult), HYDROcodone-acetaminophen, nitroGLYCERIN, ondansetron (ZOFRAN) IV, polyethylene glycol, sodium chloride flush   Vital Signs    Vitals:   04/28/17 0400 04/28/17 0600 04/28/17 0730 04/28/17 0800  BP: (!) 87/50 (!) 98/53 (!) 101/45 (!) 105/48  Pulse:   93   Resp: (!) 24 19 (!) 21   Temp: 98 F (36.7 C)  98.2 F (36.8 C)   TempSrc: Oral  Oral   SpO2: 100% 100% 95%   Weight: 159 lb 6.3 oz (72.3 kg)     Height:        Intake/Output Summary (Last 24 hours) at 04/28/2017 0822 Last data filed at 04/28/2017 0700 Gross per 24 hour  Intake 60 ml  Output -  Net 60 ml   Filed Weights   04/27/17 1026 04/27/17 1504 04/28/17 0400  Weight: 158 lb 11.7 oz (72 kg) 155 lb (70.3 kg) 159 lb 6.3 oz (72.3 kg)    Telemetry    Sinus rhythm with frequent PACs  ECG    NA  Physical Exam   GEN: No acute distress.   Neck: No JVD Cardiac: RRR, no murmurs, rubs, or gallops. R groin cath site stable.  Respiratory: Clear to auscultation bilaterally. GI: Soft, nontender, non-distended  MS: No edema; No deformity. Neuro:  Nonfocal  Psych: Normal affect    Labs    Chemistry Recent Labs  Lab 04/27/17 1039 04/27/17 1949  NA 136  --   K 3.4*  --   CL 92*  --   CO2 29  --   GLUCOSE 81  --   BUN 16  --   CREATININE 5.42* 6.79*  CALCIUM 9.2  --   GFRNONAA 10* 7*  GFRAA 11* 9*  ANIONGAP 15  --      Hematology Recent Labs  Lab 04/27/17 1039 04/27/17 1949  WBC 7.6 5.0  RBC 4.49 4.42  HGB 13.3 13.2  HCT 39.5 39.9  MCV 88.0 90.3  MCH 29.6 29.9  MCHC 33.7 33.1  RDW 14.0 14.5  PLT 222 187    Cardiac Enzymes Recent Labs  Lab 04/27/17 1949 04/27/17 2227 04/28/17 0236  TROPONINI 0.03* 0.04* 0.03*    Recent Labs  Lab 04/27/17 1104  TROPIPOC 0.02      Radiology    Dg Chest Portable 1 View  Result Date: 04/27/2017 CLINICAL DATA:  Shortness of breath. EXAM: PORTABLE CHEST 1 VIEW COMPARISON:  03/23/2017 FINDINGS: The heart size and mediastinal contours are within normal limits. There is  no evidence of pulmonary edema, consolidation, pneumothorax, nodule or pleural fluid. The visualized skeletal structures are unremarkable. IMPRESSION: No active disease. Electronically Signed   By: Aletta Edouard M.D.   On: 04/27/2017 11:04    Cardiac Studies    LEFT HEART CATH AND CORS/GRAFTS ANGIOGRAPHY  Conclusion     Prox LAD lesion is 75% stenosed. -Focal lesion that was previously described.  LIMA-LAD is widely patent and is normal in caliber. There is competitive flow.  Otherwise minimal disease throughout. Nothing made greater than 40% in the ostial circumflex.  The left ventricular systolic function is normal. The left ventricular ejection fraction is 50-55% by visual estimate.  LV end diastolic pressure is low. - ~0-3 mmHg  There is no aortic valve stenosis.   Angiographically no culprit lesion to explain the patient's symptoms.  He does have a significant LAD lesion which is a very focal lesion and easily stent pull, however there is a widely patent LIMA graft distally.  There is actually retrograde filling from  the LIMA graft to the diagonal branch which would be the only branch jeopardized by the more upstream LAD lesion. Nothing to explain the patient's symptoms.  In fact, his LVEDP is very low after dialysis.  This would indicate that he was adequately dialyzed and argue against increased LVEDP causing microvascular ischemic symptoms.   At this point, I think the only choice is to continue with his low-dose beta-blocker and nitrate for what still is probably microvascular disease disease. At least we know that with his ongoing symptoms are not likely related to any flow-limiting microvascular lesions.  Anticipate that he can be discharged tomorrow after his bedrest is over and medications have been reassessed.      Patient Profile     Jon Anderson is a 67 y.o. male with a hx of CAD s/p CABG LIMA to LAD at [facility] 01/2017, ESRD on HD, HTN, HLD, DM, chronic diastolic CHF and anemia   presented for chest pain during dialysis.   Assessment & Plan    1. CAD - Cath yesterday showed 75% pLAD with patent LIMA to LAD. Angiographically no culprit lesion to explain the patient's symptoms. LVEDP is very low after dialysis. This would indicate that he was adequately dialyzed and argue against increased LVEDP causing microvascular ischemic symptoms. - Recommended low dose BB and imdur for possible microvascular disease. May hold on day of dialysis or take after dialysis to avoid hypotension if needed. - Continue ASA, Plavix, statin, Imdur and BB.    2. PAC - quit frequent. Continue BB.    For questions or updates, please contact [place] Please consult [email or web address] for contact info under Cardiology/STEMI.      Signed, Leanor Kail, PA  04/28/2017, 8:22 AM

## 2017-04-30 ENCOUNTER — Encounter (HOSPITAL_COMMUNITY): Payer: Self-pay | Admitting: Cardiology

## 2017-05-21 ENCOUNTER — Telehealth (HOSPITAL_COMMUNITY): Payer: Self-pay

## 2017-05-21 NOTE — Telephone Encounter (Signed)
Called to follow up with patient in regards to [place] - Patient has completed HHPT but does receive dialysis on MWF. Patient stated that exercise would be too much the same days. Closed referral.

## 2017-05-31 ENCOUNTER — Encounter: Payer: Self-pay | Admitting: Podiatry

## 2017-05-31 ENCOUNTER — Ambulatory Visit (INDEPENDENT_AMBULATORY_CARE_PROVIDER_SITE_OTHER): Payer: Medicare Other | Admitting: Podiatry

## 2017-05-31 DIAGNOSIS — B351 Tinea unguium: Secondary | ICD-10-CM

## 2017-05-31 DIAGNOSIS — D689 Coagulation defect, unspecified: Secondary | ICD-10-CM

## 2017-05-31 DIAGNOSIS — E1142 Type 2 diabetes mellitus with diabetic polyneuropathy: Secondary | ICD-10-CM | POA: Diagnosis not present

## 2017-05-31 DIAGNOSIS — M79676 Pain in unspecified toe(s): Secondary | ICD-10-CM

## 2017-05-31 DIAGNOSIS — Q828 Other specified congenital malformations of skin: Secondary | ICD-10-CM

## 2017-06-01 DIAGNOSIS — Z4802 Encounter for removal of sutures: Secondary | ICD-10-CM | POA: Insufficient documentation

## 2017-06-03 NOTE — Progress Notes (Signed)
He presents today with chief complaint of painful elongated toenails and calluses bilateral.  Is a history of diabetic peripheral neuropathy and he is on anti-platelet drugs.  Objective: Signs are stable he is alert and oriented x3 pulses are palpable.  Capillary fill time is a little bit sluggish.  Toenails are long thick yellow dystrophic clinically mycotic painful on palpation as well as debridement.  He also has multiple reactive keratomas to the plantar aspect of the bilateral foot forefoot and lateral foot.  Assessment: Pain in limb secondary to onychomycosis and porokeratosis.  Plan: Discussed etiology pathology conservative versus surgical therapies at this point debrided all reactive hyperkeratosis debrided nails 1 through 5 bilateral.  And I will follow-up with him in 3 months.

## 2017-06-05 ENCOUNTER — Ambulatory Visit: Payer: Medicare Other

## 2017-06-15 ENCOUNTER — Other Ambulatory Visit: Payer: Self-pay | Admitting: Neurosurgery

## 2017-06-19 NOTE — Progress Notes (Addendum)
Anesthesia Note: Patient is a 67 year old male scheduled for microdiscectomy L5-S1, right on 07/03/17 (first case) by Dr. Consuella Lose.  History includes former smoker, CAD (s/p off-pump CABG via left thoracotomy, LIMA-LAD 58/09/98, [facility]), diastolic CHF, HTN, HLD, ESRD (HD MWF, left AVF), GERD, DM2, anemia of chronic disease, PAD (s/p PTCA/DES left SFA 12/11/13), carotid artery stenosis, RLS, T&A, left inguinal hernia repair. Mild mitral stenosis by 03/2017 echo. - Admission 04/27/17-04/28/17 for chest pain. Cardiac cath showed patent LIMA-LAD graft. Microvascular angina was a consideration for his anginal symptoms.   - PCP is listed as Dr. [name]. He medically cleared patient for surgery following 05/03/17 office visit, but did recommended cardiac clearance.  - Nephrologist is Dr. Donato Heinz.  - Cardiologist is Dr. Freddi Starr ([place]). Last visit with Dr. Remo Lipps [place]/Dr. Pu on 05/24/17. His nitrate and lisinopril were stopped (due to low BP), continue Lipitor and ASA. She wrote, "From a back surgery perspective, the patient is optimized for surgery and is likely low risk for a moderate risk procedure. We will see him in 1 year."  - [facility] Last visit on 03/08/17 with Pol Vedar, PA-C. PRN CT surgery follow-up recommended. When asked about holding ASA and Plavix for surgery, Dr. Lunette Stands wrote, "He was on these medicines prior to heart surgery. Discontinuation should be discussed with original prescriber. From a coronary artery disease standpoint he could safely discontinue if needed." - PV cardiologist is Dr. [name], last seen 12/2013. During recent [facility] admission, he was primarily seen by Dr. Minus Breeding for cardiac issues (not PAD). It appears patient was placed on Plavix after a SFA DES was placed by Dr. Gwenlyn Found in 2015.   Meds currently listed include aspirin 81 mg, Lipitor, PhosLo, Plavix, Neurontin, Rena-Vite,  nitro, Protonix, Requip, tramadol. Imdur stopped per cardiology on 05/24/17. Lopressor is listed as not-taking--patient reports that Dr. Jimmy Footman stopped due to hypotension. (Imdur and lisinopril had also been discontinued due to hypotension.)  Nikki at Dr. Cleotilde Neer office reports that patient was instructed to hold Plavix and ASA for 5 days prior to surgery.   EKG 04/27/17: SR, PAC, short PR, LAD, anterior infarct (old), ST/T way abnormality. Baseline wanderer in limb leads. I think at least some of the ST/T wave abnormalities are caused by baseline wanderer. (He had a cardiac cath the same day.) He has multiple other EKGs in [facility].   Cardiac cath 04/27/17:  Prox LAD lesion is 75% stenosed. -Focal lesion that was previously described.  LIMA-LAD is widely patent and is normal in caliber. There is competitive flow.  Otherwise minimal disease throughout. Nothing made greater than 40% in the ostial circumflex.  The left ventricular systolic function is normal. The left ventricular ejection fraction is 50-55% by visual estimate.  LV end diastolic pressure is low. - ~0-3 mmHg  There is no aortic valve stenosis. Angiographically no culprit lesion to explain the patient's symptoms.  He does have a significant LAD lesion which is a very focal lesion and easily stent pull, however there is a widely patent LIMA graft distally.  There is actually retrograde filling from the LIMA graft to the diagonal branch which would be the only branch jeopardized by the more upstream LAD lesion. Nothing to explain the patient's symptoms.  In fact, his LVEDP is very low after dialysis.  This would indicate that he was adequately dialyzed and argue against increased LVEDP causing microvascular ischemic symptoms.  Echo  03/18/17: Study Conclusions - Left ventricle: The cavity size was normal. Wall thickness was   increased in a pattern of moderate LVH. Systolic function was   normal. The estimated ejection fraction  was in the range of 55%   to 60%. Wall motion was normal; there were no regional wall   motion abnormalities. Features are consistent with a pseudonormal   left ventricular filling pattern, with concomitant abnormal   relaxation and increased filling pressure (grade 2 diastolic   dysfunction). - Mitral valve: Severely calcified annulus. The findings are   consistent with mild stenosis. - Left atrium: The atrium was severely dilated. Impressions: - Normal LV systolic function; moderate diastolic dysfunction;   moderate LVH; severe MAC with mild MS (mean gradient 5 mmHg) and   trace MR; severe LAE; mild TR.  Carotid U/S 07/27/16 [facility]; scanned under Media tab, Correspondence, Encounter date 08/16/16, scanned pate 63): Impression: Right: 20-39% diameter reduction of the right bulb. No evidence of hemodynamically significant ICA stenosis. The right vertebral artery flow is antegrade. Left: 60-79% stenosis of the left bulb with moderate hemodynamic significant based on pulse Doppler criteria. (bulb PSV 226, proximal ICA PSV 193). External carotid stenosis. The left vertebral artery flow is antegrade.   PCXR 04/27/17: IMPRESSION: No active disease.  Preoperative labs pending his PAT visit scheduled for 06/21/17. I don't see specific carotid disease follow-up recommendations, but Dr. Gwenlyn Found was recommending 6 month follow-up based on 11/2014 that also showed 62/70% LICA stenosis. Discussed with anesthesiologist Dr. Nolon Nations. Would recommend getting additional input regarding carotid stenosis and Plavix. I contacted Dr. Kennon Holter ([place] cardiologist) office and spoke with nurse Eliezer Lofts. Because patient has not been within the last three years in the office, he would be considered a new patient. Therefore suggested contacted his current cardiologist or PCP. I called and spoke with Dr. Abbott Pao. He gave permission to hold Plavix up to 7 days if needed and will order a carotid ultrasound.   George Hugh [facility] Short Stay Center/Anesthesiology Phone [phone] 06/20/2017 5:09 PM  Addendum: Labs from PAT noted. K 3.8, H/H 12.7/39.9. PLT 186. INR 0.98. A1c 7.3. He will get an ISTAT4 on arrival for surgery. Vitals stable. Patient is aware that carotid ultrasound has been arranged for 06/22/17 by Dr. Abbott Pao. Will plan to follow-up results.  Iliac Duplex 07/27/16 [facility]): Impression: Right: Patent right iliac artery and vein without evidence of stenosis. Left: Patent left iliac artery and vein without evidence of stenosis.  George Hugh [facility] Short Stay Center/Anesthesiology Phone [phone] 06/21/2017 3:58 PM  Addendum: Carotid U/S moved to 06/26/17. Results showed: IMPRESSION: - Less than 50% stenosis in the right internal carotid artery. - Greater than 70% stenosis in the left internal carotid artery (ICA: 938 cm/sec; systolic ICA/CCA ratio: 4.7). - RIGHT CAROTID ARTERY: Prominent calcified plaque along the mid and upper common carotid artery. Moderate plaque in the bulb. Low resistance internal carotid Doppler pattern. - RIGHT VERTEBRAL ARTERY:  Antegrade. - LEFT CAROTID ARTERY: Extensive irregular mixed calcified and soft plaque in the left bulb. Low resistance internal carotid Doppler pattern. - LEFT VERTEBRAL ARTERY:  Antegrade.  LICA stenosis remains > 70%. Discussed with anesthesiologist Dr. Renold Don. Patient will need either Dr. Gwenlyn Found or vascular surgeon to weigh in regarding carotid artery management plans. Will follow-up with surgeon and/or PCP office tomorrow (since it is now after 5 PM).  George Hugh [facility] Short Stay Center/Anesthesiology Phone [phone] 06/26/2017 5:16 PM  Addendum: Patient was seen  by Dr. Gwenlyn Found today. Carotid CTA ordered and done on 3/21/1showing: IMPRESSION: - Atherosclerotic plaque right carotid bifurcation. No significant internal carotid artery stenosis on the right. 50% diameter stenosis proximal left external carotid  artery. Atherosclerotic plaque throughout the right cavernous carotid. - 60% diameter stenosis distal left common carotid artery and 30% diameter stenosis proximal left internal carotid artery. Moderate to severe stenosis proximal left external carotid artery. Extensive atherosclerotic disease left cavernous carotid. - Scattered disease throughout the right vertebral artery which is patent. Severe stenosis at the origin of the left vertebral artery which is patent to the basilar.  Dr. Gwenlyn Found reviewed reports and wrote, "No high-grade internal carotid artery stenosis demonstrated. Cleared for back surgery at low risk from a vascular point of view."  Patient now with primary care, vascular, cardiology, and CT surgery clearances. He will need ISTAT on arrival since he is on hemodialysis, but if results acceptable and otherwise no acute changes then I anticipate that he can proceed as planned.  George Hugh [facility] Short Stay Center/Anesthesiology Phone [phone] 06/28/2017 3:43 PM

## 2017-06-20 ENCOUNTER — Telehealth: Payer: Self-pay | Admitting: Cardiology

## 2017-06-20 NOTE — Telephone Encounter (Signed)
Returned call to [place] with anesthesia. Patient is scheduled for surgery on March 26. During work up, anesthesiologist suggested that patient have a repeat carotid doppler as he had stenosis noted during a renal transplant work up at [facility] of 60-79%. He also had a 1 vessel CABG at [facility] and has followed up with cardiology there. Bryson Ha is calling to see if Dr. Gwenlyn Found will order the carotid doppler per anesthesia's request. Explained that patient is considered a new patient with Dr. Gwenlyn Found (last seen >3 years ago). Explained that Dr. Gwenlyn Found would likely want to see patient in the office for an evaluation before ordering tests. Suggested she call current cardiologist at [facility] or contact PCP to order test if patient wishes to have it done in our office. Explained that patient can re-establish care with our group if he so chooses.   Routed to MD

## 2017-06-20 NOTE — Telephone Encounter (Signed)
Not sure why this came to me

## 2017-06-20 NOTE — Telephone Encounter (Signed)
Sounds good to me

## 2017-06-20 NOTE — Telephone Encounter (Signed)
New Message   Jon Anderson with anasthesia at [facility] verbalized that the pt might need to have a carotid before his surgery on 3/26. Hasn't seen berry since 2015 but harding did pt's heart cath on 04/27/17. Please call

## 2017-06-20 NOTE — Pre-Procedure Instructions (Signed)
Jon Anderson  06/20/2017      [id number] - [place], [place] - [address] [place] [address] Phone: [phone] Fax: [phone]    Your procedure is scheduled on July 03, 2017.  Report to [facility] Admitting at 530 AM.  Call this number if you have problems the morning of surgery:  [phone]   Remember:  Do not eat food or drink liquids after midnight.  Take these medicines the morning of surgery with A SIP OF WATER tylenol-if needed, gabapentin (neurontin), protonix (pantoprazole), tramadol (ultram)-if needed for pain.  Stop/resume Plavix/aspirin as instructed by your doctor.  7 days prior to surgery STOP Aleve, Naproxen, Ibuprofen, Motrin, Advil, Goody's, BC's, all herbal medications, fish oil, and all vitamins  Continue all other medications as instructed by your physician except follow the above medication instructions before surgery   Do not wear jewelry.  Do not wear lotions, powders, or colognes, or deodorant.  Men may shave face and neck.  Do not bring valuables to the hospital.  [facility] is not responsible for any belongings or valuables.  Contacts, dentures or bridgework may not be worn into surgery.  Leave your suitcase in the car.  After surgery it may be brought to your room.  For patients admitted to the hospital, discharge time will be determined by your treatment team.  Patients discharged the day of surgery will not be allowed to drive home.   [facility]- Preparing For Surgery  Before surgery, you can play an important role. Because skin is not sterile, your skin needs to be as free of germs as possible. You can reduce the number of germs on your skin by washing with CHG (chlorahexidine gluconate) Soap before surgery.  CHG is an antiseptic cleaner which kills germs and bonds with the skin to continue killing germs even after washing.  Please do not  use if you have an allergy to CHG or antibacterial soaps. If your skin becomes reddened/irritated stop using the CHG.  Do not shave (including legs and underarms) for at least 48 hours prior to first CHG shower. It is OK to shave your face.  Please follow these instructions carefully.   1. Shower the NIGHT BEFORE SURGERY and the MORNING OF SURGERY with CHG.   2. If you chose to wash your hair, wash your hair first as usual with your normal shampoo.  3. After you shampoo, rinse your hair and body thoroughly to remove the shampoo.  4. Use CHG as you would any other liquid soap. You can apply CHG directly to the skin and wash gently with a scrungie or a clean washcloth.   5. Apply the CHG Soap to your body ONLY FROM THE NECK DOWN.  Do not use on open wounds or open sores. Avoid contact with your eyes, ears, mouth and genitals (private parts). Wash Face and genitals (private parts)  with your normal soap.  6. Wash thoroughly, paying special attention to the area where your surgery will be performed.  7. Thoroughly rinse your body with warm water from the neck down.  8. DO NOT shower/wash with your normal soap after using and rinsing off the CHG Soap.  9. Pat yourself dry with a CLEAN TOWEL.  10. Wear CLEAN PAJAMAS to bed the night before surgery, wear comfortable clothes the morning of surgery  11. Place CLEAN SHEETS on your bed the night of your first  shower and DO NOT SLEEP WITH PETS.  Day of Surgery: Do not apply any deodorants/lotions. Please wear clean clothes to the hospital/surgery center.    Please read over the following fact sheets that you were given. Pain Booklet, Coughing and Deep Breathing, MRSA Information and Surgical Site Infection Prevention

## 2017-06-20 NOTE — Progress Notes (Addendum)
PCP: [name],  MD  Cardiologist: Dr. Alexandria Lodge Pu  EKG: 04/27/17 in EPIC  Stress test:5-10 years ago  ECHO: 03/18/17 in EPIC  Cardiac Cath: 04/27/17 in EPIC  Chest x-raY: 03/23/17 IN Epic  Patient is taking plavix and aspirin 81 mg-instructed to hold beginning 06/28/17.  Patient is scheduled for carotid ultrasound 06/22/17 at 1530 with Dr. Mellody Drown  Patient has ESRD and is on dialysis 3 days a week (Monday, Wednesday, Friday)

## 2017-06-21 ENCOUNTER — Other Ambulatory Visit: Payer: Self-pay

## 2017-06-21 ENCOUNTER — Other Ambulatory Visit: Payer: Self-pay | Admitting: Internal Medicine

## 2017-06-21 ENCOUNTER — Encounter (HOSPITAL_COMMUNITY): Payer: Self-pay

## 2017-06-21 ENCOUNTER — Encounter (HOSPITAL_COMMUNITY)
Admission: RE | Admit: 2017-06-21 | Discharge: 2017-06-21 | Disposition: A | Discharge status: Home / Self Care | Payer: Medicare Other | Source: Ambulatory Visit | Attending: Neurosurgery | Admitting: Neurosurgery

## 2017-06-21 DIAGNOSIS — N186 End stage renal disease: Secondary | ICD-10-CM | POA: Insufficient documentation

## 2017-06-21 DIAGNOSIS — I2510 Atherosclerotic heart disease of native coronary artery without angina pectoris: Secondary | ICD-10-CM | POA: Insufficient documentation

## 2017-06-21 DIAGNOSIS — I5030 Unspecified diastolic (congestive) heart failure: Secondary | ICD-10-CM | POA: Diagnosis not present

## 2017-06-21 DIAGNOSIS — Z79899 Other long term (current) drug therapy: Secondary | ICD-10-CM | POA: Diagnosis not present

## 2017-06-21 DIAGNOSIS — M5126 Other intervertebral disc displacement, lumbar region: Secondary | ICD-10-CM | POA: Insufficient documentation

## 2017-06-21 DIAGNOSIS — Z87891 Personal history of nicotine dependence: Secondary | ICD-10-CM | POA: Diagnosis not present

## 2017-06-21 DIAGNOSIS — E785 Hyperlipidemia, unspecified: Secondary | ICD-10-CM | POA: Diagnosis not present

## 2017-06-21 DIAGNOSIS — Z7982 Long term (current) use of aspirin: Secondary | ICD-10-CM | POA: Diagnosis not present

## 2017-06-21 DIAGNOSIS — E1151 Type 2 diabetes mellitus with diabetic peripheral angiopathy without gangrene: Secondary | ICD-10-CM | POA: Diagnosis not present

## 2017-06-21 DIAGNOSIS — E1122 Type 2 diabetes mellitus with diabetic chronic kidney disease: Secondary | ICD-10-CM | POA: Diagnosis not present

## 2017-06-21 DIAGNOSIS — Z951 Presence of aortocoronary bypass graft: Secondary | ICD-10-CM | POA: Insufficient documentation

## 2017-06-21 DIAGNOSIS — N189 Chronic kidney disease, unspecified: Secondary | ICD-10-CM

## 2017-06-21 DIAGNOSIS — Z7902 Long term (current) use of antithrombotics/antiplatelets: Secondary | ICD-10-CM | POA: Diagnosis not present

## 2017-06-21 DIAGNOSIS — I132 Hypertensive heart and chronic kidney disease with heart failure and with stage 5 chronic kidney disease, or end stage renal disease: Secondary | ICD-10-CM | POA: Diagnosis not present

## 2017-06-21 DIAGNOSIS — D631 Anemia in chronic kidney disease: Secondary | ICD-10-CM | POA: Diagnosis not present

## 2017-06-21 DIAGNOSIS — Z01818 Encounter for other preprocedural examination: Secondary | ICD-10-CM | POA: Diagnosis present

## 2017-06-21 DIAGNOSIS — I25119 Atherosclerotic heart disease of native coronary artery with unspecified angina pectoris: Secondary | ICD-10-CM

## 2017-06-21 HISTORY — DX: Sleep apnea, unspecified: G47.30

## 2017-06-21 LAB — BASIC METABOLIC PANEL
Anion gap: 13 (ref 5–15)
BUN: 18 mg/dL (ref 6–20)
CO2: 28 mmol/L (ref 22–32)
Calcium: 9.4 mg/dL (ref 8.9–10.3)
Chloride: 93 mmol/L — ABNORMAL LOW (ref 101–111)
Creatinine, Ser: 7.43 mg/dL — ABNORMAL HIGH (ref 0.61–1.24)
GFR calc Af Amer: 8 mL/min — ABNORMAL LOW (ref >60)
GFR calc non Af Amer: 7 mL/min — ABNORMAL LOW (ref >60)
Glucose, Bld: 111 mg/dL — ABNORMAL HIGH (ref 65–99)
Potassium: 3.8 mmol/L (ref 3.5–5.1)
Sodium: 134 mmol/L — ABNORMAL LOW (ref 135–145)

## 2017-06-21 LAB — GLUCOSE, CAPILLARY: Glucose-Capillary: 119 mg/dL — ABNORMAL HIGH (ref 65–99)

## 2017-06-21 LAB — PROTIME-INR
INR: 0.98
Prothrombin Time: 12.9 seconds (ref 11.4–15.2)

## 2017-06-21 LAB — HEMOGLOBIN A1C
Hgb A1c MFr Bld: 7.3 % — ABNORMAL HIGH (ref 4.8–5.6)
Mean Plasma Glucose: 162.81 mg/dL

## 2017-06-21 LAB — CBC
HCT: 39.9 % (ref 39.0–52.0)
Hemoglobin: 12.7 g/dL — ABNORMAL LOW (ref 13.0–17.0)
MCH: 28.7 pg (ref 26.0–34.0)
MCHC: 31.8 g/dL (ref 30.0–36.0)
MCV: 90.3 fL (ref 78.0–100.0)
Platelets: 186 10*3/uL (ref 150–400)
RBC: 4.42 MIL/uL (ref 4.22–5.81)
RDW: 16.6 % — ABNORMAL HIGH (ref 11.5–15.5)
WBC: 5.8 10*3/uL (ref 4.0–10.5)

## 2017-06-21 LAB — SURGICAL PCR SCREEN
MRSA, PCR: NEGATIVE
Staphylococcus aureus: NEGATIVE

## 2017-06-21 MED ORDER — CHLORHEXIDINE GLUCONATE CLOTH 2 % EX PADS: 6.0000 | MEDICATED_PAD | Freq: Once | CUTANEOUS | Status: DC

## 2017-06-21 NOTE — Telephone Encounter (Signed)
Attempted to return call to [facility] with anesthesia. There was no answer, and no specific VM. Did not leave message. Upon chart review, patient is scheduled for carotid U/S on 06/22/17

## 2017-06-22 ENCOUNTER — Other Ambulatory Visit: Payer: Medicare Other

## 2017-06-26 ENCOUNTER — Ambulatory Visit
Admission: RE | Admit: 2017-06-26 | Discharge: 2017-06-26 | Disposition: A | Discharge status: Home / Self Care | Payer: Medicare Other | Source: Ambulatory Visit | Attending: Internal Medicine | Admitting: Internal Medicine

## 2017-06-26 DIAGNOSIS — N189 Chronic kidney disease, unspecified: Secondary | ICD-10-CM

## 2017-06-26 DIAGNOSIS — I25119 Atherosclerotic heart disease of native coronary artery with unspecified angina pectoris: Secondary | ICD-10-CM

## 2017-06-27 ENCOUNTER — Telehealth: Payer: Self-pay | Admitting: Cardiovascular Disease

## 2017-06-27 NOTE — Telephone Encounter (Signed)
Spoke to Sidney PA ,she is inquiring if patient needs any further attention to carotid doppler.  Informed Ebony Hail PA- PATIENT WILL NEED AN APPOINTMENT. Ebony Hail aware ans ask for RN TO CALL PATIENT for appointmnet.  Called patient schedule an appointment for tomorrow with DR Gwenlyn Found.  PATIENT VERBALIZED UNDERSTANDING

## 2017-06-27 NOTE — Telephone Encounter (Signed)
[name] Pace, PA with [facility] Anesthesiology. She states that she was told to call office with update and one of the triage nurses will give her directions for the next steps

## 2017-06-27 NOTE — Telephone Encounter (Signed)
Received incoming records from [facility] for appointment on 06/28/17 @ 8:30 with Dr. Gwenlyn Found. Records placed in Dr. Kennon Holter box with schedule. 06/27/17

## 2017-06-28 ENCOUNTER — Encounter: Payer: Self-pay | Admitting: Cardiovascular Disease

## 2017-06-28 ENCOUNTER — Ambulatory Visit (INDEPENDENT_AMBULATORY_CARE_PROVIDER_SITE_OTHER): Payer: Medicare Other | Admitting: Cardiovascular Disease

## 2017-06-28 ENCOUNTER — Ambulatory Visit (INDEPENDENT_AMBULATORY_CARE_PROVIDER_SITE_OTHER)
Admission: RE | Admit: 2017-06-28 | Discharge: 2017-06-28 | Disposition: A | Discharge status: Home / Self Care | Payer: Medicare Other | Source: Ambulatory Visit | Attending: Cardiovascular Disease | Admitting: Cardiovascular Disease

## 2017-06-28 VITALS — BP 103/58 | HR 82 | Ht 71.0 in | Wt 161.0 lb

## 2017-06-28 DIAGNOSIS — I739 Peripheral vascular disease, unspecified: Secondary | ICD-10-CM | POA: Diagnosis not present

## 2017-06-28 DIAGNOSIS — I6522 Occlusion and stenosis of left carotid artery: Secondary | ICD-10-CM | POA: Diagnosis not present

## 2017-06-28 LAB — BASIC METABOLIC PANEL
BUN/Creatinine Ratio: 3 — ABNORMAL LOW (ref 10–24)
BUN: 23 mg/dL (ref 8–27)
CO2: 27 mmol/L (ref 20–29)
Calcium: 9.4 mg/dL (ref 8.6–10.2)
Chloride: 94 mmol/L — ABNORMAL LOW (ref 96–106)
Creatinine, Ser: 7.28 mg/dL — ABNORMAL HIGH (ref 0.76–1.27)
GFR calc Af Amer: 8 mL/min/{1.73_m2} — ABNORMAL LOW (ref >59)
GFR calc non Af Amer: 7 mL/min/{1.73_m2} — ABNORMAL LOW (ref >59)
Glucose: 69 mg/dL (ref 65–99)
Potassium: 3.9 mmol/L (ref 3.5–5.2)
Sodium: 138 mmol/L (ref 134–144)

## 2017-06-28 MED ORDER — METOPROLOL SUCCINATE ER 50 MG PO TB24: ORAL_TABLET | ORAL | 0 refills | Status: DC

## 2017-06-28 MED ORDER — IOPAMIDOL (ISOVUE-370) INJECTION 76%
75.0000 mL | Freq: Once | INTRAVENOUS | Status: AC | PRN
  Administered 2017-06-28: 75 mL via INTRAVENOUS

## 2017-06-28 NOTE — Progress Notes (Signed)
06/28/2017 Jon Anderson   Jan 08, 1951  [id number]  Primary Physician [name], MD Primary Cardiologist: Lorretta Harp MD [name], [place]  HPI:  Jon Anderson is a 67 y.o.  mildly overweight married African American male father of 2, grandfather Of one grandchild who Works as a Sports coach. He was referred by Dr. Scherrie November for peripheral vascular evaluation prior to elective foot surgery. His primary care physician is Dr. Mellody Drown. I last saw him at the time of his procedure 12/11/13 and has not come back for follow-up symptoms.His cardiac risk factors include hypertension, diabetes and hyperlipidemia. He does not smoke. His sister died of a myocardial infarction at age 38. He has never had a heart attack or stroke and has had cardiac catheterization remotely that showed noncritical CAD. He does get fairly frequent chest pain and shortness of breath however. He has chronic renal sufficiency with creatinines in the mid 3 range thought to chronic kidney. Lower extremity arterial Doppler studies performed in the office 09/01/13 revealed a right ABI 1.1 the left of 0.76 with a high-frequency signal in the distal left SFA. He does complain of left calf claudication which is lifestyle limiting and affecting his ability to perform work.. There is no history of nonhealing wounds. Recent carotid Dopplers showed moderately severe left internal carotid artery stenosis.  Since I saw him last he has gone on dialysis February 2016. He also underwent off-pump LIMA insertion at [facility] in October of last year because of a high grade lesion that was discovered during a workup for renal transplant. He will cardiac catheterization by Dr. Ellyn Hack 04/27/17 revealed a patent LIMA to the LAD with otherwise noncritical disease and normal LV function. He currently denies chest pain. He is scheduled for laminectomy on 07/03/17. Carotid Dopplers performed at [facility] imaging showed  moderately severe left ICA stenosis which when compared to the Dopplers performed in our office 10/15/13, did not appear significantly different. However, given the severity of the lesion I n the 70-90% range and to get a CTA to further Characterize the severity prior to clearing him for his upcoming elective neurosurgical procedure.   Current Meds  Medication Sig  . acetaminophen (TYLENOL) 500 MG tablet Take 1,000 mg by mouth 2 (two) times daily as needed for moderate pain or headache.  Marland Kitchen aspirin EC 81 MG tablet Take 1 tablet (81 mg total) by mouth daily.  Marland Kitchen atorvastatin (LIPITOR) 40 MG tablet Take 1 tablet (40 mg total) by mouth daily at 6 PM.  . calcium acetate (PHOSLO) 667 MG capsule Take 3 capsules (2,001 mg total) by mouth 3 (three) times daily with meals. (Patient taking differently: Take 1,334 mg by mouth 2 (two) times daily with a meal. )  . clopidogrel (PLAVIX) 75 MG tablet Take 1 tablet (75 mg total) by mouth daily with breakfast.  . gabapentin (NEURONTIN) 100 MG capsule Take 100 mg by mouth 2 (two) times daily.  Marland Kitchen glycerin adult 2 g suppository Place 1 suppository rectally as needed for constipation.  Marland Kitchen HYDROcodone-acetaminophen (NORCO) 5-325 MG tablet Take 1-2 tablets by mouth daily as needed.  . multivitamin (RENA-VIT) TABS tablet Take 1 tablet by mouth at bedtime.  . nitroGLYCERIN (NITROSTAT) 0.4 MG SL tablet Place 1 tablet (0.4 mg total) under the tongue every 5 (five) minutes as needed for chest pain.  . pantoprazole (PROTONIX) 40 MG tablet Take 40 mg by mouth daily.  . polyethylene glycol (MIRALAX / GLYCOLAX) packet Take  17 g by mouth daily as needed for moderate constipation.  Marland Kitchen rOPINIRole (REQUIP) 0.5 MG tablet Take 0.5 mg by mouth See admin instructions. Take 0.5 mg by mouth once every night, take an addition 0.5 mg dose during the day 3 times a week on dialysis days  . traMADol (ULTRAM) 50 MG tablet Take 50 mg by mouth 3 (three) times daily as needed for pain.     Allergies    Allergen Reactions  . Kiwi Extract Swelling    Facial swelling   . Tape Other (See Comments)    Plastic tape-blistering    Social History   Socioeconomic History  . Marital status: Married    Spouse name: Not on file  . Number of children: Not on file  . Years of education: Not on file  . Highest education level: Not on file  Occupational History  . Not on file  Social Needs  . Financial resource strain: Not on file  . Food insecurity:    Worry: Not on file    Inability: Not on file  . Transportation needs:    Medical: Not on file    Non-medical: Not on file  Tobacco Use  . Smoking status: Former Smoker    Packs/day: 1.00    Years: 2.00    Pack years: 2.00    Types: Cigarettes  . Smokeless tobacco: Never Used  . Tobacco comment: quit smoking 40 yrs ago  Substance and Sexual Activity  . Alcohol use: No    Alcohol/week: 0.0 oz  . Drug use: No  . Sexual activity: Yes    Birth control/protection: None  Lifestyle  . Physical activity:    Days per week: Not on file    Minutes per session: Not on file  . Stress: Not on file  Relationships  . Social connections:    Talks on phone: Not on file    Gets together: Not on file    Attends religious service: Not on file    Active member of club or organization: Not on file    Attends meetings of clubs or organizations: Not on file    Relationship status: Not on file  . Intimate partner violence:    Fear of current or ex partner: Not on file    Emotionally abused: Not on file    Physically abused: Not on file    Forced sexual activity: Not on file  Other Topics Concern  . Not on file  Social History Narrative   The patient is a Retail buyer.  He is married and has 2 grown children.  Lives in [place] with his wife.  Denies tobacco, alcohol or IV drug abuse or marijuana or cocaine intake.      Review of Systems: General: negative for chills, fever, night sweats or weight changes.  Cardiovascular: negative for chest pain,  dyspnea on exertion, edema, orthopnea, palpitations, paroxysmal nocturnal dyspnea or shortness of breath Dermatological: negative for rash Respiratory: negative for cough or wheezing Urologic: negative for hematuria Abdominal: negative for nausea, vomiting, diarrhea, bright red blood per rectum, melena, or hematemesis Neurologic: negative for visual changes, syncope, or dizziness All other systems reviewed and are otherwise negative except as noted above.    Blood pressure (!) 103/58, pulse 82, height 5\' 11"  (1.803 m), weight 161 lb (73 kg).  General appearance: alert and no distress Neck: no adenopathy, no JVD, supple, symmetrical, trachea midline, thyroid not enlarged, symmetric, no tenderness/mass/nodules and bilateral carotid bruits left lateral thoracic Lungs: clear  to auscultation bilaterally Heart: regular rate and rhythm, S1, S2 normal, no murmur, click, rub or gallop Extremities: extremities normal, atraumatic, no cyanosis or edema Pulses: 2+ and symmetric Skin: Skin color, texture, turgor normal. No rashes or lesions Neurologic: Alert and oriented X 3, normal strength and tone. Normal symmetric reflexes. Normal coordination and gait  EKG not performed today  ASSESSMENT AND PLAN:   Peripheral arterial disease History of peripheral arterial disease status post left SFA PTA and drug-eluting balloon angioplasty using chocolate balloon and lutonix by myself 12/11/13. I used CO2 angiography because of his chronic renal insufficiency and got a great result. He has not had Doppler since has not seen him back since. He apparently had carotid Dopplers performed at [facility] imaging on 06/26/17 that showed moderately severe left ICA stenosis.He is scheduled for elective decompressive surgery next Tuesday. I am going to get a CT angiogram to further characterize his carotid stenosis prior to his elective neurosurgical procedure.      Lorretta Harp MD FACP,FACC,FAHA,  [facility] 06/28/2017 8:50 AM

## 2017-06-28 NOTE — Assessment & Plan Note (Signed)
History of peripheral arterial disease status post left SFA PTA and drug-eluting balloon angioplasty using chocolate balloon and lutonix by myself 12/11/13. I used CO2 angiography because of his chronic renal insufficiency and got a great result. He has not had Doppler since has not seen him back since. He apparently had carotid Dopplers performed at [facility] imaging on 06/26/17 that showed moderately severe left ICA stenosis.He is scheduled for elective decompressive surgery next Tuesday. I am going to get a CT angiogram to further characterize his carotid stenosis prior to his elective neurosurgical procedure.

## 2017-06-28 NOTE — Patient Instructions (Signed)
Medication Instructions: Your physician recommends that you continue on your current medications as directed. Please refer to the Current Medication list given to you today.   Labwork: Your physician recommends that you return for lab work prior to CTA to check kidney function--BMET   Testing/Procedures: Please arrive at the [facility] main entrance of [facility] at                AM (30-45 minutes prior to test start time)  [facility] [place], [place] [address] [phone]  Proceed to the [facility] Radiology Department (First Floor).  Please follow these instructions carefully (unless otherwise directed):  Hold all erectile dysfunction medications at least 48 hours prior to test.  On the Night Before the Test: . Drink plenty of water. . Do not consume any caffeinated/decaffeinated beverages or chocolate 12 hours prior to your test. . Do not take any antihistamines 12 hours prior to your test.  On the Day of the Test: . Drink plenty of water. Do not drink any water within one hour of the test. . Do not eat any food 4 hours prior to the test. . You may take your regular medications prior to the test. . IF NOT ON A BETA BLOCKER - Take 50 mg of lopressor (metoprolol) one hour before the test.  After the Test: . Drink plenty of water. . After receiving IV contrast, you may experience a mild flushed feeling. This is normal. . On occasion, you may experience a mild rash up to 24 hours after the test. This is not dangerous. If this occurs, you can take Benadryl 25 mg and increase your fluid intake. . If you experience trouble breathing, this can be serious. If it is severe call 911 IMMEDIATELY. If it is mild, please call our office. . If you take any of these medications: Glipizide/Metformin, Avandament, Glucavance, please do not take 48 hours after completing test.   Other testing:  Your physician has requested that you have a lower  extremity arterial duplex. During this test, ultrasound is used to evaluate arterial blood flow in the legs. Allow one hour for this exam. There are no restrictions or special instructions.  Your physician has requested that you have an ankle brachial index (ABI). During this test an ultrasound and blood pressure cuff are used to evaluate the arteries that supply the arms and legs with blood. Allow thirty minutes for this exam. There are no restrictions or special instructions.   Follow-Up: Your physician recommends that you schedule a follow-up appointment as needed with Dr. Gwenlyn Found.

## 2017-07-03 ENCOUNTER — Ambulatory Visit (HOSPITAL_COMMUNITY): Payer: Medicare Other | Admitting: Vascular Surgery

## 2017-07-03 ENCOUNTER — Observation Stay (HOSPITAL_COMMUNITY)
Admission: RE | Admit: 2017-07-03 | Discharge: 2017-07-03 | Disposition: A | Discharge status: Home / Self Care | Payer: Medicare Other | Source: Ambulatory Visit | Attending: Neurosurgery | Admitting: Neurosurgery

## 2017-07-03 ENCOUNTER — Encounter (HOSPITAL_COMMUNITY): Payer: Self-pay | Admitting: General Practice

## 2017-07-03 ENCOUNTER — Other Ambulatory Visit: Payer: Self-pay

## 2017-07-03 ENCOUNTER — Ambulatory Visit (HOSPITAL_COMMUNITY): Payer: Medicare Other

## 2017-07-03 ENCOUNTER — Encounter (HOSPITAL_COMMUNITY)
Admission: RE | Disposition: A | Discharge status: Home / Self Care | Payer: Self-pay | Source: Ambulatory Visit | Attending: Neurosurgery

## 2017-07-03 DIAGNOSIS — E1122 Type 2 diabetes mellitus with diabetic chronic kidney disease: Secondary | ICD-10-CM | POA: Diagnosis not present

## 2017-07-03 DIAGNOSIS — I2510 Atherosclerotic heart disease of native coronary artery without angina pectoris: Secondary | ICD-10-CM | POA: Insufficient documentation

## 2017-07-03 DIAGNOSIS — Z91018 Allergy to other foods: Secondary | ICD-10-CM | POA: Insufficient documentation

## 2017-07-03 DIAGNOSIS — G4730 Sleep apnea, unspecified: Secondary | ICD-10-CM | POA: Diagnosis not present

## 2017-07-03 DIAGNOSIS — N186 End stage renal disease: Secondary | ICD-10-CM | POA: Insufficient documentation

## 2017-07-03 DIAGNOSIS — G2581 Restless legs syndrome: Secondary | ICD-10-CM | POA: Diagnosis not present

## 2017-07-03 DIAGNOSIS — Z7982 Long term (current) use of aspirin: Secondary | ICD-10-CM | POA: Diagnosis not present

## 2017-07-03 DIAGNOSIS — I120 Hypertensive chronic kidney disease with stage 5 chronic kidney disease or end stage renal disease: Secondary | ICD-10-CM | POA: Insufficient documentation

## 2017-07-03 DIAGNOSIS — Z87891 Personal history of nicotine dependence: Secondary | ICD-10-CM | POA: Insufficient documentation

## 2017-07-03 DIAGNOSIS — Z992 Dependence on renal dialysis: Secondary | ICD-10-CM | POA: Insufficient documentation

## 2017-07-03 DIAGNOSIS — M5116 Intervertebral disc disorders with radiculopathy, lumbar region: Secondary | ICD-10-CM | POA: Diagnosis present

## 2017-07-03 DIAGNOSIS — Z7902 Long term (current) use of antithrombotics/antiplatelets: Secondary | ICD-10-CM | POA: Insufficient documentation

## 2017-07-03 DIAGNOSIS — Z951 Presence of aortocoronary bypass graft: Secondary | ICD-10-CM | POA: Diagnosis not present

## 2017-07-03 DIAGNOSIS — E1151 Type 2 diabetes mellitus with diabetic peripheral angiopathy without gangrene: Secondary | ICD-10-CM | POA: Diagnosis not present

## 2017-07-03 DIAGNOSIS — K219 Gastro-esophageal reflux disease without esophagitis: Secondary | ICD-10-CM | POA: Insufficient documentation

## 2017-07-03 DIAGNOSIS — Z888 Allergy status to other drugs, medicaments and biological substances status: Secondary | ICD-10-CM | POA: Insufficient documentation

## 2017-07-03 DIAGNOSIS — Z79899 Other long term (current) drug therapy: Secondary | ICD-10-CM | POA: Insufficient documentation

## 2017-07-03 DIAGNOSIS — Z419 Encounter for procedure for purposes other than remedying health state, unspecified: Secondary | ICD-10-CM

## 2017-07-03 DIAGNOSIS — M5126 Other intervertebral disc displacement, lumbar region: Secondary | ICD-10-CM | POA: Diagnosis present

## 2017-07-03 HISTORY — PX: LUMBAR LAMINECTOMY/DECOMPRESSION MICRODISCECTOMY: SHX5026

## 2017-07-03 LAB — GLUCOSE, CAPILLARY
Glucose-Capillary: 84 mg/dL (ref 65–99)
Glucose-Capillary: 116 mg/dL — ABNORMAL HIGH (ref 65–99)
Glucose-Capillary: 149 mg/dL — ABNORMAL HIGH (ref 65–99)

## 2017-07-03 LAB — POCT I-STAT 4, (NA,K, GLUC, HGB,HCT)
Glucose, Bld: 87 mg/dL (ref 65–99)
HCT: 34 % — ABNORMAL LOW (ref 39.0–52.0)
Hemoglobin: 11.6 g/dL — ABNORMAL LOW (ref 13.0–17.0)
Potassium: 3.5 mmol/L (ref 3.5–5.1)
Sodium: 138 mmol/L (ref 135–145)

## 2017-07-03 SURGERY — LUMBAR LAMINECTOMY/DECOMPRESSION MICRODISCECTOMY 1 LEVEL
Anesthesia: General | Laterality: Right

## 2017-07-03 MED ORDER — CEFAZOLIN SODIUM-DEXTROSE 2-4 GM/100ML-% IV SOLN: 2.0000 g | Freq: Two times a day (BID) | INTRAVENOUS | Status: DC

## 2017-07-03 MED ORDER — THROMBIN 5000 UNITS EX SOLR
CUTANEOUS | Status: AC
  Filled 2017-07-03: qty 15000

## 2017-07-03 MED ORDER — THROMBIN (RECOMBINANT) 5000 UNITS EX SOLR
OROMUCOSAL | Status: DC | PRN
  Administered 2017-07-03: 07:00:00 via TOPICAL

## 2017-07-03 MED ORDER — GLYCERIN (LAXATIVE) 2 G RE SUPP
1.0000 | RECTAL | Status: DC | PRN
  Filled 2017-07-03: qty 1

## 2017-07-03 MED ORDER — HYDROMORPHONE HCL 1 MG/ML IJ SOLN
INTRAMUSCULAR | Status: AC
  Filled 2017-07-03: qty 1

## 2017-07-03 MED ORDER — LIDOCAINE 2% (20 MG/ML) 5 ML SYRINGE
INTRAMUSCULAR | Status: DC | PRN
  Administered 2017-07-03: 100 mg via INTRAVENOUS

## 2017-07-03 MED ORDER — ONDANSETRON HCL 4 MG/2ML IJ SOLN: 4.0000 mg | Freq: Four times a day (QID) | INTRAMUSCULAR | Status: DC | PRN

## 2017-07-03 MED ORDER — DEXAMETHASONE SODIUM PHOSPHATE 10 MG/ML IJ SOLN
INTRAMUSCULAR | Status: DC | PRN
  Administered 2017-07-03: 5 mg via INTRAVENOUS

## 2017-07-03 MED ORDER — ALBUTEROL SULFATE (2.5 MG/3ML) 0.083% IN NEBU
INHALATION_SOLUTION | RESPIRATORY_TRACT | Status: AC
  Filled 2017-07-03: qty 3

## 2017-07-03 MED ORDER — DEXAMETHASONE SODIUM PHOSPHATE 10 MG/ML IJ SOLN
INTRAMUSCULAR | Status: AC
  Filled 2017-07-03: qty 1

## 2017-07-03 MED ORDER — CLOPIDOGREL BISULFATE 75 MG PO TABS: 75.0000 mg | ORAL_TABLET | Freq: Every day | ORAL | 11 refills | Status: DC

## 2017-07-03 MED ORDER — OXYCODONE HCL 5 MG PO TABS
ORAL_TABLET | ORAL | Status: AC
  Filled 2017-07-03: qty 1

## 2017-07-03 MED ORDER — CALCIUM ACETATE (PHOS BINDER) 667 MG PO CAPS
1334.0000 mg | ORAL_CAPSULE | Freq: Two times a day (BID) | ORAL | Status: DC
  Filled 2017-07-03 (×2): qty 2

## 2017-07-03 MED ORDER — HYDROMORPHONE HCL 1 MG/ML IJ SOLN
0.2500 mg | INTRAMUSCULAR | Status: DC | PRN
  Administered 2017-07-03 (×2): 0.5 mg via INTRAVENOUS

## 2017-07-03 MED ORDER — SODIUM CHLORIDE 0.9% FLUSH: 3.0000 mL | Freq: Two times a day (BID) | INTRAVENOUS | Status: DC

## 2017-07-03 MED ORDER — MIDAZOLAM HCL 2 MG/2ML IJ SOLN
INTRAMUSCULAR | Status: AC
  Filled 2017-07-03: qty 2

## 2017-07-03 MED ORDER — OXYCODONE HCL 5 MG PO TABS
5.0000 mg | ORAL_TABLET | ORAL | Status: DC | PRN
  Administered 2017-07-03: 5 mg via ORAL

## 2017-07-03 MED ORDER — METHOCARBAMOL 500 MG PO TABS: 500.0000 mg | ORAL_TABLET | Freq: Four times a day (QID) | ORAL | 0 refills | Status: DC | PRN

## 2017-07-03 MED ORDER — BISACODYL 10 MG RE SUPP: 10.0000 mg | Freq: Every day | RECTAL | Status: DC | PRN

## 2017-07-03 MED ORDER — PANTOPRAZOLE SODIUM 40 MG PO TBEC: 40.0000 mg | DELAYED_RELEASE_TABLET | Freq: Every day | ORAL | Status: DC

## 2017-07-03 MED ORDER — ROPINIROLE HCL 1 MG PO TABS: 0.5000 mg | ORAL_TABLET | ORAL | Status: DC

## 2017-07-03 MED ORDER — CEFAZOLIN SODIUM-DEXTROSE 2-4 GM/100ML-% IV SOLN
2.0000 g | INTRAVENOUS | Status: AC
  Administered 2017-07-03: 2 g via INTRAVENOUS
  Filled 2017-07-03: qty 100

## 2017-07-03 MED ORDER — LIDOCAINE-EPINEPHRINE 1 %-1:100000 IJ SOLN
INTRAMUSCULAR | Status: DC | PRN
  Administered 2017-07-03: 10 mL

## 2017-07-03 MED ORDER — POLYETHYLENE GLYCOL 3350 17 G PO PACK: 17.0000 g | PACK | Freq: Every day | ORAL | Status: DC | PRN

## 2017-07-03 MED ORDER — PHENYLEPHRINE 40 MCG/ML (10ML) SYRINGE FOR IV PUSH (FOR BLOOD PRESSURE SUPPORT)
PREFILLED_SYRINGE | INTRAVENOUS | Status: DC | PRN
  Administered 2017-07-03: 80 ug via INTRAVENOUS

## 2017-07-03 MED ORDER — LIDOCAINE HCL (CARDIAC) 20 MG/ML IV SOLN
INTRAVENOUS | Status: AC
  Filled 2017-07-03: qty 5

## 2017-07-03 MED ORDER — PROPOFOL 10 MG/ML IV BOLUS
INTRAVENOUS | Status: AC
  Filled 2017-07-03: qty 40

## 2017-07-03 MED ORDER — MIDAZOLAM HCL 2 MG/2ML IJ SOLN
INTRAMUSCULAR | Status: DC | PRN
  Administered 2017-07-03 (×2): 1 mg via INTRAVENOUS

## 2017-07-03 MED ORDER — GABAPENTIN 100 MG PO CAPS: 100.0000 mg | ORAL_CAPSULE | Freq: Two times a day (BID) | ORAL | Status: DC

## 2017-07-03 MED ORDER — PHENYLEPHRINE 40 MCG/ML (10ML) SYRINGE FOR IV PUSH (FOR BLOOD PRESSURE SUPPORT)
PREFILLED_SYRINGE | INTRAVENOUS | Status: AC
  Filled 2017-07-03: qty 10

## 2017-07-03 MED ORDER — METHOCARBAMOL 500 MG PO TABS
500.0000 mg | ORAL_TABLET | Freq: Four times a day (QID) | ORAL | Status: DC | PRN
  Administered 2017-07-03: 500 mg via ORAL

## 2017-07-03 MED ORDER — SODIUM CHLORIDE 0.9 % IR SOLN
Status: DC | PRN
  Administered 2017-07-03: 07:00:00

## 2017-07-03 MED ORDER — LACTATED RINGERS IV SOLN
INTRAVENOUS | Status: DC | PRN
  Administered 2017-07-03: 07:00:00 via INTRAVENOUS

## 2017-07-03 MED ORDER — ESMOLOL HCL 100 MG/10ML IV SOLN
INTRAVENOUS | Status: DC | PRN
  Administered 2017-07-03 (×2): 20 mg via INTRAVENOUS

## 2017-07-03 MED ORDER — METHOCARBAMOL 1000 MG/10ML IJ SOLN
500.0000 mg | Freq: Four times a day (QID) | INTRAVENOUS | Status: DC | PRN
  Filled 2017-07-03: qty 5

## 2017-07-03 MED ORDER — GLYCOPYRROLATE 0.2 MG/ML IV SOSY
PREFILLED_SYRINGE | INTRAVENOUS | Status: DC | PRN
  Administered 2017-07-03: .8 mg via INTRAVENOUS

## 2017-07-03 MED ORDER — ONDANSETRON HCL 4 MG/2ML IJ SOLN
INTRAMUSCULAR | Status: DC | PRN
  Administered 2017-07-03: 4 mg via INTRAVENOUS

## 2017-07-03 MED ORDER — ALBUTEROL SULFATE (2.5 MG/3ML) 0.083% IN NEBU
2.5000 mg | INHALATION_SOLUTION | Freq: Four times a day (QID) | RESPIRATORY_TRACT | Status: DC | PRN
  Administered 2017-07-03: 2.5 mg via RESPIRATORY_TRACT

## 2017-07-03 MED ORDER — MENTHOL 3 MG MT LOZG: 1.0000 | LOZENGE | OROMUCOSAL | Status: DC | PRN

## 2017-07-03 MED ORDER — 0.9 % SODIUM CHLORIDE (POUR BTL) OPTIME
TOPICAL | Status: DC | PRN
  Administered 2017-07-03: 1000 mL

## 2017-07-03 MED ORDER — ACETAMINOPHEN 650 MG RE SUPP: 650.0000 mg | RECTAL | Status: DC | PRN

## 2017-07-03 MED ORDER — BUPIVACAINE HCL (PF) 0.5 % IJ SOLN
INTRAMUSCULAR | Status: DC | PRN
  Administered 2017-07-03: 10 mL

## 2017-07-03 MED ORDER — RENA-VITE PO TABS
1.0000 | ORAL_TABLET | Freq: Every day | ORAL | Status: DC
  Filled 2017-07-03: qty 1

## 2017-07-03 MED ORDER — NEOSTIGMINE METHYLSULFATE 5 MG/5ML IV SOSY
PREFILLED_SYRINGE | INTRAVENOUS | Status: AC
  Filled 2017-07-03: qty 5

## 2017-07-03 MED ORDER — ROCURONIUM BROMIDE 10 MG/ML (PF) SYRINGE
PREFILLED_SYRINGE | INTRAVENOUS | Status: AC
  Filled 2017-07-03: qty 5

## 2017-07-03 MED ORDER — METHOCARBAMOL 500 MG PO TABS
ORAL_TABLET | ORAL | Status: AC
  Filled 2017-07-03: qty 1

## 2017-07-03 MED ORDER — ROCURONIUM BROMIDE 10 MG/ML (PF) SYRINGE
PREFILLED_SYRINGE | INTRAVENOUS | Status: DC | PRN
  Administered 2017-07-03: 50 mg via INTRAVENOUS

## 2017-07-03 MED ORDER — SODIUM CHLORIDE 0.9% FLUSH: 3.0000 mL | INTRAVENOUS | Status: DC | PRN

## 2017-07-03 MED ORDER — NEOSTIGMINE METHYLSULFATE 5 MG/5ML IV SOSY
PREFILLED_SYRINGE | INTRAVENOUS | Status: DC | PRN
  Administered 2017-07-03: 5 mg via INTRAVENOUS

## 2017-07-03 MED ORDER — ACETAMINOPHEN 325 MG PO TABS: 650.0000 mg | ORAL_TABLET | ORAL | Status: DC | PRN

## 2017-07-03 MED ORDER — LIDOCAINE-EPINEPHRINE 1 %-1:100000 IJ SOLN
INTRAMUSCULAR | Status: AC
  Filled 2017-07-03: qty 1

## 2017-07-03 MED ORDER — ALBUTEROL SULFATE HFA 108 (90 BASE) MCG/ACT IN AERS
INHALATION_SPRAY | RESPIRATORY_TRACT | Status: DC | PRN
  Administered 2017-07-03 (×3): 2 via RESPIRATORY_TRACT

## 2017-07-03 MED ORDER — ATORVASTATIN CALCIUM 20 MG PO TABS: 40.0000 mg | ORAL_TABLET | Freq: Every day | ORAL | Status: DC

## 2017-07-03 MED ORDER — DOCUSATE SODIUM 100 MG PO CAPS: 100.0000 mg | ORAL_CAPSULE | Freq: Two times a day (BID) | ORAL | Status: DC

## 2017-07-03 MED ORDER — SODIUM CHLORIDE 0.9 % IV SOLN: 250.0000 mL | INTRAVENOUS | Status: DC

## 2017-07-03 MED ORDER — OXYCODONE HCL 5 MG PO TABS
10.0000 mg | ORAL_TABLET | ORAL | Status: DC | PRN
  Administered 2017-07-03 (×2): 10 mg via ORAL
  Filled 2017-07-03 (×2): qty 2

## 2017-07-03 MED ORDER — ONDANSETRON HCL 4 MG PO TABS: 4.0000 mg | ORAL_TABLET | Freq: Four times a day (QID) | ORAL | Status: DC | PRN

## 2017-07-03 MED ORDER — ONDANSETRON HCL 4 MG/2ML IJ SOLN
INTRAMUSCULAR | Status: AC
  Filled 2017-07-03: qty 2

## 2017-07-03 MED ORDER — SODIUM CHLORIDE 0.9 % IV SOLN: INTRAVENOUS | Status: DC

## 2017-07-03 MED ORDER — FENTANYL CITRATE (PF) 250 MCG/5ML IJ SOLN
INTRAMUSCULAR | Status: AC
  Filled 2017-07-03: qty 5

## 2017-07-03 MED ORDER — PHENYLEPHRINE HCL 10 MG/ML IJ SOLN
INTRAVENOUS | Status: DC | PRN
  Administered 2017-07-03: 25 ug/min via INTRAVENOUS

## 2017-07-03 MED ORDER — METHYLPREDNISOLONE ACETATE 80 MG/ML IJ SUSP
INTRAMUSCULAR | Status: AC
  Filled 2017-07-03: qty 1

## 2017-07-03 MED ORDER — OXYCODONE HCL 10 MG PO TABS: 10.0000 mg | ORAL_TABLET | Freq: Four times a day (QID) | ORAL | 0 refills | Status: DC | PRN

## 2017-07-03 MED ORDER — METHYLPREDNISOLONE ACETATE 80 MG/ML IJ SUSP
INTRAMUSCULAR | Status: DC | PRN
  Administered 2017-07-03: 80 mg

## 2017-07-03 MED ORDER — SODIUM CHLORIDE 0.9 % IV SOLN
INTRAVENOUS | Status: DC | PRN
  Administered 2017-07-03 (×3): via INTRAVENOUS

## 2017-07-03 MED ORDER — ONDANSETRON HCL 4 MG/2ML IJ SOLN: 4.0000 mg | Freq: Once | INTRAMUSCULAR | Status: DC | PRN

## 2017-07-03 MED ORDER — MEPERIDINE HCL 50 MG/ML IJ SOLN: 6.2500 mg | INTRAMUSCULAR | Status: DC | PRN

## 2017-07-03 MED ORDER — SENNA 8.6 MG PO TABS: 1.0000 | ORAL_TABLET | Freq: Two times a day (BID) | ORAL | Status: DC

## 2017-07-03 MED ORDER — PHENOL 1.4 % MT LIQD: 1.0000 | OROMUCOSAL | Status: DC | PRN

## 2017-07-03 MED ORDER — FENTANYL CITRATE (PF) 250 MCG/5ML IJ SOLN
INTRAMUSCULAR | Status: DC | PRN
  Administered 2017-07-03: 150 ug via INTRAVENOUS

## 2017-07-03 MED ORDER — BUPIVACAINE HCL (PF) 0.5 % IJ SOLN
INTRAMUSCULAR | Status: AC
  Filled 2017-07-03: qty 30

## 2017-07-03 MED ORDER — NITROGLYCERIN 0.4 MG SL SUBL: 0.4000 mg | SUBLINGUAL_TABLET | SUBLINGUAL | Status: DC | PRN

## 2017-07-03 MED ORDER — PROPOFOL 10 MG/ML IV BOLUS
INTRAVENOUS | Status: DC | PRN
  Administered 2017-07-03: 150 mg via INTRAVENOUS

## 2017-07-03 SURGICAL SUPPLY — 64 items
ADH SKN CLS APL DERMABOND .7 (GAUZE/BANDAGES/DRESSINGS) ×1
APL SKNCLS STERI-STRIP NONHPOA (GAUZE/BANDAGES/DRESSINGS)
BAG DECANTER FOR FLEXI CONT (MISCELLANEOUS) ×2 IMPLANT
BENZOIN TINCTURE PRP APPL 2/3 (GAUZE/BANDAGES/DRESSINGS) IMPLANT
BLADE CLIPPER SURG (BLADE) IMPLANT
BLADE SURG 11 STRL SS (BLADE) ×2 IMPLANT
BUR MATCHSTICK NEURO 3.0 LAGG (BURR) ×2 IMPLANT
CANISTER SUCT 3000ML PPV (MISCELLANEOUS) ×2 IMPLANT
CARTRIDGE OIL MAESTRO DRILL (MISCELLANEOUS) ×1 IMPLANT
DECANTER SPIKE VIAL GLASS SM (MISCELLANEOUS) ×2 IMPLANT
DERMABOND ADVANCED (GAUZE/BANDAGES/DRESSINGS) ×1
DERMABOND ADVANCED .7 DNX12 (GAUZE/BANDAGES/DRESSINGS) ×1 IMPLANT
DIFFUSER DRILL AIR PNEUMATIC (MISCELLANEOUS) ×2 IMPLANT
DRAPE LAPAROTOMY 100X72X124 (DRAPES) ×2 IMPLANT
DRAPE MICROSCOPE LEICA (MISCELLANEOUS) ×2 IMPLANT
DRAPE SURG 17X23 STRL (DRAPES) ×2 IMPLANT
DRSG OPSITE POSTOP 3X4 (GAUZE/BANDAGES/DRESSINGS) ×2 IMPLANT
DRSG OPSITE POSTOP 4X6 (GAUZE/BANDAGES/DRESSINGS) ×2 IMPLANT
DURAPREP 26ML APPLICATOR (WOUND CARE) ×2 IMPLANT
ELECT REM PT RETURN 9FT ADLT (ELECTROSURGICAL) ×2
ELECTRODE REM PT RTRN 9FT ADLT (ELECTROSURGICAL) ×1 IMPLANT
GAUZE SPONGE 4X4 12PLY STRL (GAUZE/BANDAGES/DRESSINGS) IMPLANT
GAUZE SPONGE 4X4 16PLY XRAY LF (GAUZE/BANDAGES/DRESSINGS) IMPLANT
GLOVE BIO SURGEON STRL SZ7 (GLOVE) IMPLANT
GLOVE BIOGEL PI IND STRL 7.0 (GLOVE) IMPLANT
GLOVE BIOGEL PI IND STRL 7.5 (GLOVE) ×1 IMPLANT
GLOVE BIOGEL PI IND STRL 8 (GLOVE) ×2 IMPLANT
GLOVE BIOGEL PI INDICATOR 7.0 (GLOVE)
GLOVE BIOGEL PI INDICATOR 7.5 (GLOVE) ×1
GLOVE BIOGEL PI INDICATOR 8 (GLOVE) ×2
GLOVE ECLIPSE 7.0 STRL STRAW (GLOVE) ×2 IMPLANT
GLOVE ECLIPSE 7.5 STRL STRAW (GLOVE) ×6 IMPLANT
GLOVE ECLIPSE 9.0 STRL (GLOVE) ×1 IMPLANT
GLOVE EXAM NITRILE LRG STRL (GLOVE) IMPLANT
GLOVE EXAM NITRILE XL STR (GLOVE) IMPLANT
GLOVE EXAM NITRILE XS STR PU (GLOVE) IMPLANT
GOWN STRL REUS W/ TWL LRG LVL3 (GOWN DISPOSABLE) ×1 IMPLANT
GOWN STRL REUS W/ TWL XL LVL3 (GOWN DISPOSABLE) IMPLANT
GOWN STRL REUS W/TWL 2XL LVL3 (GOWN DISPOSABLE) ×1 IMPLANT
GOWN STRL REUS W/TWL LRG LVL3 (GOWN DISPOSABLE) ×2
GOWN STRL REUS W/TWL XL LVL3 (GOWN DISPOSABLE) ×2
HEMOSTAT POWDER KIT SURGIFOAM (HEMOSTASIS) ×2 IMPLANT
KIT BASIN OR (CUSTOM PROCEDURE TRAY) ×2 IMPLANT
KIT ROOM TURNOVER OR (KITS) ×2 IMPLANT
NDL HYPO 18GX1.5 BLUNT FILL (NEEDLE) IMPLANT
NEEDLE HYPO 18GX1.5 BLUNT FILL (NEEDLE) ×2 IMPLANT
NEEDLE HYPO 22GX1.5 SAFETY (NEEDLE) ×2 IMPLANT
NEEDLE SPNL 18GX3.5 QUINCKE PK (NEEDLE) ×2 IMPLANT
NS IRRIG 1000ML POUR BTL (IV SOLUTION) ×2 IMPLANT
OIL CARTRIDGE MAESTRO DRILL (MISCELLANEOUS) ×2
PACK LAMINECTOMY NEURO (CUSTOM PROCEDURE TRAY) ×2 IMPLANT
PAD ARMBOARD 7.5X6 YLW CONV (MISCELLANEOUS) ×6 IMPLANT
RUBBERBAND STERILE (MISCELLANEOUS) ×4 IMPLANT
SPONGE LAP 4X18 X RAY DECT (DISPOSABLE) IMPLANT
SPONGE SURGIFOAM ABS GEL SZ50 (HEMOSTASIS) ×2 IMPLANT
STRIP CLOSURE SKIN 1/2X4 (GAUZE/BANDAGES/DRESSINGS) IMPLANT
SUT VIC AB 0 CT1 18XCR BRD8 (SUTURE) ×1 IMPLANT
SUT VIC AB 0 CT1 8-18 (SUTURE) ×2
SUT VIC AB 2-0 CT1 18 (SUTURE) IMPLANT
SUT VICRYL 3-0 RB1 18 ABS (SUTURE) ×4 IMPLANT
SYR 3ML LL SCALE MARK (SYRINGE) ×2 IMPLANT
TOWEL GREEN STERILE (TOWEL DISPOSABLE) ×2 IMPLANT
TOWEL GREEN STERILE FF (TOWEL DISPOSABLE) ×2 IMPLANT
WATER STERILE IRR 1000ML POUR (IV SOLUTION) ×2 IMPLANT

## 2017-07-03 NOTE — Progress Notes (Signed)
Pt has stridor respirations whr awakening sats 94-95 with o2 at 2 liters on nasal canula dr Conrad [place] consulted orders obtained and carried out

## 2017-07-03 NOTE — H&P (Signed)
Chief Complaint  Right leg pain  History of Present Illness  Jon Anderson is a 67 y.o. male I am seeing for the above complaint.  He was initially evaluated about 6 months ago with right-sided back and leg pain which started without any identifiable inciting event.  Unfortunately at that time he was scheduled to undergo coronary artery bypass graft at [facility].  He underwent that procedure without any significant complication and has actually made an excellent recovery.  We did attempt to manage his radiculopathy with epidural steroid injections which unfortunately were only transient.  He has obtained clearance from his cardiologist and cardiothoracic surgeon, and presents today for surgical decompression.  Past Medical History   Past Medical History:  Diagnosis Date  . Anemia of chronic disease   . CAD (coronary artery disease)    a.  Myoview 4/11: EF 53%, no scar or ischemia   c. MV 2012 Nl perfusion, apical thinning.  No ischemia or scar.  EF 49%, appears greater by visual estimate.;  d.  Dob stress echo 12/13:  Negative Dob stress echo. There is no evidence of ischemia.  The LVF is normal. b. Normal cors 2016.  . Carotid stenosis    a. <34% RICA, >19% LICA by duplex 09/2227  . Chronic chest pain    occ  . ESRD (end stage renal disease) on dialysis ([place])   . GERD (gastroesophageal reflux disease)   . HTN (hypertension)    echo 3/10: EF 60%, LAE  . Hyperlipidemia   . Nephrolithiasis    "passed them all"  . Peripheral arterial disease ([place])    a. s/p PTCA to L SFA.  Marland Kitchen Pneumonia   . Restless legs   . Sleep apnea    no cpap, needs to reschedule appointment to set up aquiring cpap  . Snores    a. presumed OSA, pt has refused sleep eval in past.  . Type II diabetes mellitus ([place])    no longer on medications, checks blood glucose at home    Past Surgical History   Past Surgical History:  Procedure Laterality Date  . ANGIOPLASTY / STENTING FEMORAL Left 12/11/2013   dr berry   . AV FISTULA PLACEMENT Left 03/19/2014   Procedure: CREATION OF ARTERIOVENOUS (AV) FISTULA  LEFT UPPER ARM;  Surgeon: Mal Misty, MD;  Location: [facility];  Service: Vascular;  Laterality: Left;  . CARDIAC CATHETERIZATION  2001 and 2010   . COLONOSCOPY W/ BIOPSIES AND POLYPECTOMY    . COLONOSCOPY WITH PROPOFOL N/A 08/01/2016   Procedure: COLONOSCOPY WITH PROPOFOL;  Surgeon: Carol Ada, MD;  Location: [facility];  Service: Endoscopy;  Laterality: N/A;  . ESOPHAGOGASTRODUODENOSCOPY (EGD) WITH PROPOFOL N/A 08/01/2016   Procedure: ESOPHAGOGASTRODUODENOSCOPY (EGD) WITH PROPOFOL;  Surgeon: Carol Ada, MD;  Location: [facility];  Service: Endoscopy;  Laterality: N/A;  . FOOT FRACTURE SURGERY Right   . FRACTURE SURGERY    . INGUINAL HERNIA REPAIR Left   . LEFT HEART CATH AND CORS/GRAFTS ANGIOGRAPHY N/A 04/27/2017   Procedure: LEFT HEART CATH AND CORS/GRAFTS ANGIOGRAPHY;  Surgeon: Leonie Man, MD;  Location: [facility];  Service: Cardiovascular;  Laterality: N/A;  . LEFT HEART CATHETERIZATION WITH CORONARY ANGIOGRAM N/A 06/22/2014   Procedure: LEFT HEART CATHETERIZATION WITH CORONARY ANGIOGRAM;  Surgeon: Troy Sine, MD;  Location: [facility] CATH LAB;  Service: Cardiovascular;  Laterality: N/A;  . LOWER EXTREMITY ANGIOGRAM Left 12/11/2013   Procedure: LOWER EXTREMITY ANGIOGRAM;  Surgeon: Lorretta Harp, MD;  Location: [facility]  CATH LAB;  Service: Cardiovascular;  Laterality: Left;  . TONSILLECTOMY AND ADENOIDECTOMY      Social History   Social History   Tobacco Use  . Smoking status: Former Smoker    Packs/day: 1.00    Years: 2.00    Pack years: 2.00    Types: Cigarettes  . Smokeless tobacco: Never Used  . Tobacco comment: quit smoking 40 yrs ago  Substance Use Topics  . Alcohol use: No    Alcohol/week: 0.0 oz  . Drug use: No    Medications   Prior to Admission medications   Medication Sig Start Date End Date Taking? Authorizing Provider  acetaminophen (TYLENOL) 500 MG  tablet Take 1,000 mg by mouth 2 (two) times daily as needed for moderate pain or headache.   Yes [provider]  aspirin EC 81 MG tablet Take 1 tablet (81 mg total) by mouth daily. 05/28/12  Yes Weaver, Scott T, PA-C  atorvastatin (LIPITOR) 40 MG tablet Take 1 tablet (40 mg total) by mouth daily at 6 PM. 12/13/13  Yes Brett Canales, PA-C  calcium acetate (PHOSLO) 667 MG capsule Take 3 capsules (2,001 mg total) by mouth 3 (three) times daily with meals. Patient taking differently: Take 1,334 mg by mouth 2 (two) times daily with a meal.  10/09/15  Yes Short, Noah Delaine, MD  clopidogrel (PLAVIX) 75 MG tablet Take 1 tablet (75 mg total) by mouth daily with breakfast. 12/13/13  Yes Brett Canales, PA-C  gabapentin (NEURONTIN) 100 MG capsule Take 100 mg by mouth 2 (two) times daily.   Yes [provider]  metoprolol succinate (TOPROL-XL) 50 MG 24 hr tablet Take 1 tablet by mouth one hour prior to CTA. 06/28/17  Yes Lorretta Harp, MD  multivitamin (RENA-VIT) TABS tablet Take 1 tablet by mouth at bedtime. 06/23/14  Yes Domenic Polite, MD  pantoprazole (PROTONIX) 40 MG tablet Take 40 mg by mouth daily.   Yes [provider]  polyethylene glycol (MIRALAX / GLYCOLAX) packet Take 17 g by mouth daily as needed for moderate constipation. 03/22/17  Yes Aline August, MD  rOPINIRole (REQUIP) 0.5 MG tablet Take 0.5 mg by mouth See admin instructions. Take 0.5 mg by mouth once every night, take an addition 0.5 mg dose during the day 3 times a week on dialysis days   Yes [provider]  traMADol (ULTRAM) 50 MG tablet Take 50 mg by mouth 3 (three) times daily as needed for pain. 06/06/17  Yes [provider]  glycerin adult 2 g suppository Place 1 suppository rectally as needed for constipation.    [provider]  HYDROcodone-acetaminophen (NORCO) 5-325 MG tablet Take 1-2 tablets by mouth daily as needed. 01/16/17   Leandrew Koyanagi, MD  nitroGLYCERIN (NITROSTAT) 0.4 MG SL  tablet Place 1 tablet (0.4 mg total) under the tongue every 5 (five) minutes as needed for chest pain. 03/20/12   Barrett, Evelene Croon, PA-C    Allergies   Allergies  Allergen Reactions  . Kiwi Extract Swelling    Facial swelling   . Tape Other (See Comments)    Plastic tape-blistering    Review of Systems  ROS  Neurologic Exam  Awake, alert, oriented Memory and concentration grossly intact Speech fluent, appropriate CN grossly intact Motor exam: Upper Extremities Deltoid Bicep Tricep Grip  Right 5/5 5/5 5/5 5/5  Left 5/5 5/5 5/5 5/5   Lower Extremities IP Quad PF DF EHL  Right 5/5 5/5 5/5 5/5 5/5  Left  5/5 5/5 5/5 5/5 5/5   Sensation grossly intact to LT  Imaging  MRI of the lumbar spine in addition to multilevel disc degenerative disease with endplate change, demonstrates a large right eccentric L5-S1 disc herniation with compression of the traversing right S1 nerve root.  Impression  - 67 y.o. male approximately 5 months status post CABG with right-sided radiculopathy related to large right L5-S1 disc herniation.  He has been cleared by his heart doctors to proceed with surgery  Plan  -We will plan on proceeding with right L5-S1 laminotomy and microdiscectomy  I have reviewed in detail the treatment options with the patient and his family in the office, including continued conservative treatment versus surgical decompression.  The risks and benefits of surgery were explained in detail also.  After all questions were answered, informed consent was obtained and witnessed.

## 2017-07-03 NOTE — Evaluation (Signed)
Physical Therapy Evaluation Patient Details Name: Jon Anderson MRN: [id number] DOB: 1951-03-11 Today's Date: 07/03/2017   History of Present Illness  Pt is a 67 y/o male s/p L5-S1 laminotomy and microdiscectomy. PMH includes DM,, HTN, ESRD on HD MWF, PAD, OSA, CAD s/p heart cath, s/p L AV fistula placement, and s/p R foot fx surgery.   Clinical Impression  Patient is s/p above surgery resulting in the deficits listed below (see PT Problem List). Upon eval, pt with very little tolerance for mobility secondary to pain and instability. Pt with increased tremors during gait, and required min A for steadying with RW. Also noted pt with difficulty swallowing, as pt coughed up part of his sandwich upon sitting; notified RN and recommend speech consult. Pt's wife very concerned about pt going home today and feel pt is a high fall risk; notified RN. Feel pt will progress well once pain is controlled. Patient will benefit from skilled PT to increase their independence and safety with mobility (while adhering to their precautions) to allow discharge to the venue listed below.     Follow Up Recommendations Home health PT;Supervision/Assistance - 24 hour    Equipment Recommendations  3in1 (PT)    Recommendations for Other Services Speech consult     Precautions / Restrictions Precautions Precautions: Back Precaution Booklet Issued: Yes (comment) Precaution Comments: Reviewed back precautions with pt.  Restrictions Weight Bearing Restrictions: No      Mobility  Bed Mobility Overal bed mobility: Needs Assistance Bed Mobility: Rolling;Sidelying to Sit;Sit to Sidelying Rolling: Supervision Sidelying to sit: Supervision     Sit to sidelying: Supervision General bed mobility comments: Supervision to ensure log roll technique. Verbal cues for sequencing and required increased time secondary to pain. Upon sitting, pt coughing and coughed up part of sandwhich he was eating. Reports having  difficulty swallowing. Notified RN and recommend SLP eval.   Transfers Overall transfer level: Needs assistance Equipment used: Rolling walker (2 wheeled);Straight cane Transfers: Sit to/from Stand Sit to Stand: Mod assist;Max assist;Min assist         General transfer comment: Attempted standing with cane, and required mod- max A for lift assist and steadying. With use of RW, assist required decreased to min A for lift assist and steadying. Required cues for safe hand placement.   Ambulation/Gait Ambulation/Gait assistance: Min assist Ambulation Distance (Feet): 3 Feet Assistive device: Rolling walker (2 wheeled) Gait Pattern/deviations: Step-through pattern;Decreased stride length Gait velocity: Decreased  Gait velocity interpretation: Below normal speed for age/gender General Gait Details: Slow, guarded unsteady gait. Pt with increased tremors throughout extremities with ambulation and required min A. Returned to sitting and unable to attempt further mobility. Pt with labored breathing, however, pt reports it is because of increased pain.   Stairs            Wheelchair Mobility    Modified Rankin (Stroke Patients Only)       Balance Overall balance assessment: Needs assistance Sitting-balance support: No upper extremity supported;Feet supported Sitting balance-Leahy Scale: Fair     Standing balance support: Bilateral upper extremity supported;During functional activity Standing balance-Leahy Scale: Poor Standing balance comment: Reliant on BUE support and external assist.                              Pertinent Vitals/Pain Pain Assessment: Faces Faces Pain Scale: Hurts whole lot Pain Location: back  Pain Descriptors / Indicators: Aching;Operative site guarding;Grimacing Pain Intervention(s): Limited activity  within patient's tolerance;Monitored during session;Repositioned    Home Living Family/patient expects to be discharged to:: Private  residence Living Arrangements: Spouse/significant other Available Help at Discharge: Family;Available 24 hours/day Type of Home: House Home Access: Stairs to enter Entrance Stairs-Rails: None Entrance Stairs-Number of Steps: 2(small porch steps ) Home Layout: One level Home Equipment: Walker - 2 wheels;Cane - single point      Prior Function Level of Independence: Independent with assistive device(s)         Comments: Reports using cane for ambulation.      Hand Dominance   Dominant Hand: Right    Extremity/Trunk Assessment   Upper Extremity Assessment Upper Extremity Assessment: Defer to OT evaluation    Lower Extremity Assessment Lower Extremity Assessment: RLE deficits/detail;Generalized weakness RLE Deficits / Details: RLE weakness at baseline. Noted buckling in RLE.     Cervical / Trunk Assessment Cervical / Trunk Assessment: Other exceptions Cervical / Trunk Exceptions: s/p lumbar surgery.  Communication   Communication: No difficulties  Cognition Arousal/Alertness: Awake/alert Behavior During Therapy: WFL for tasks assessed/performed Overall Cognitive Status: Within Functional Limits for tasks assessed                                        General Comments General comments (skin integrity, edema, etc.): Pt's wife present in room. Very concerned about pt going home given limited mobility status, notified RN. Educated about using RW at home to increase stability and educated about generalized walking program.     Exercises     Assessment/Plan    PT Assessment Patient needs continued PT services  PT Problem List Decreased strength;Decreased activity tolerance;Decreased balance;Decreased mobility;Decreased knowledge of use of DME;Decreased knowledge of precautions;Pain       PT Treatment Interventions DME instruction;Gait training;Stair training;Functional mobility training;Therapeutic activities;Therapeutic exercise;Balance  training;Neuromuscular re-education;Patient/family education    PT Goals (Current goals can be found in the Care Plan section)  Acute Rehab PT Goals Patient Stated Goal: to get better  PT Goal Formulation: With patient Time For Goal Achievement: 07/17/17 Potential to Achieve Goals: Good    Frequency Min 5X/week   Barriers to discharge        Co-evaluation               AM-PAC PT "6 Clicks" Daily Activity  Outcome Measure Difficulty turning over in bed (including adjusting bedclothes, sheets and blankets)?: A Little Difficulty moving from lying on back to sitting on the side of the bed? : A Little Difficulty sitting down on and standing up from a chair with arms (e.g., wheelchair, bedside commode, etc,.)?: Unable Help needed moving to and from a bed to chair (including a wheelchair)?: A Little Help needed walking in hospital room?: A Lot Help needed climbing 3-5 steps with a railing? : A Lot 6 Click Score: 14    End of Session Equipment Utilized During Treatment: Gait belt Activity Tolerance: Patient limited by pain Patient left: in bed;with call bell/phone within reach;with family/visitor present Nurse Communication: Mobility status;Other (comment)(difficulty swallowing) PT Visit Diagnosis: Unsteadiness on feet (R26.81);Other abnormalities of gait and mobility (R26.89);Muscle weakness (generalized) (M62.81);Pain Pain - part of body: (back )    Time: 1308-6578 PT Time Calculation (min) (ACUTE ONLY): 30 min   Charges:   PT Evaluation $PT Eval Moderate Complexity: 1 Mod PT Treatments $Therapeutic Activity: 8-22 mins   PT G Codes:  Leighton Ruff, PT, DPT  Acute Rehabilitation Services  Pager: [phone]   Rudean Hitt 07/03/2017, 2:59 PM

## 2017-07-03 NOTE — Transfer of Care (Signed)
Immediate Anesthesia Transfer of Care Note  Patient: Jon Anderson  Procedure(s) Performed: MICRODISCECTOMY LUMBAR FIVE - SACRAL ONE RIGHT (Right )  Patient Location: PACU  Anesthesia Type:General  Level of Consciousness: drowsy  Airway & Oxygen Therapy: Patient Spontanous Breathing and Patient connected to face mask oxygen  Post-op Assessment: Report given to RN and Post -op Vital signs reviewed and stable  Post vital signs: Reviewed and stable  Last Vitals:  Vitals Value Taken Time  BP 173/96 07/03/2017  9:53 AM  Temp    Pulse 105 07/03/2017  9:54 AM  Resp 27 07/03/2017  9:54 AM  SpO2 95 % 07/03/2017  9:54 AM  Vitals shown include unvalidated device data.  Last Pain:  Vitals:   07/03/17 0938  TempSrc:   PainSc: 0-No pain      Patients Stated Pain Goal: 2 (45/14/60 4799)  Complications: No apparent anesthesia complications

## 2017-07-03 NOTE — Progress Notes (Signed)
Ancef 2g IV q8h changed to Ancef 2g IV q12h due to low CrCl.

## 2017-07-03 NOTE — Anesthesia Postprocedure Evaluation (Signed)
Anesthesia Post Note  Patient: Jon Anderson  Procedure(s) Performed: MICRODISCECTOMY LUMBAR FIVE - SACRAL ONE RIGHT (Right )     Patient location during evaluation: PACU Anesthesia Type: General Level of consciousness: awake and alert Pain management: pain level controlled Vital Signs Assessment: post-procedure vital signs reviewed and stable Respiratory status: spontaneous breathing, nonlabored ventilation, respiratory function stable and patient connected to nasal cannula oxygen Cardiovascular status: blood pressure returned to baseline and stable Postop Assessment: no apparent nausea or vomiting Anesthetic complications: no    Last Vitals:  Vitals:   07/03/17 1315 07/03/17 1636  BP: (!) 173/83 129/68  Pulse: 93 81  Resp: 18 18  Temp: (!) 36.3 C 36.5 C  SpO2: 96% 94%    Last Pain:  Vitals:   07/03/17 1430  TempSrc:   PainSc: Asleep                 Skya Mccullum DAVID

## 2017-07-03 NOTE — Progress Notes (Signed)
Pt and wife given D/C instructions with Rx's, verbal understanding was provided. Pt's incision is clean and dry with no sign of infection. Pt's IV was removed prior to D/C. Pt D/C'd home via wheelchair per MD order. Pt is stable @ D/C and has no other needs at this time. Holli Humbles, RN

## 2017-07-03 NOTE — Op Note (Signed)
PREOP DIAGNOSIS: Lumbar disc herniation, right L5-S1  POSTOP DIAGNOSIS: Same  PROCEDURE: 1. Right L5-S1 laminotomy and microdiscectomy for decompression of nerve root 2. Use of operating microscope  SURGEON: Dr. Consuella Lose, MD  ASSISTANT: Dr. Earnie Larsson, MD  ANESTHESIA: General Endotracheal  EBL: 50cc  SPECIMENS: None  DRAINS: None  COMPLICATIONS: None immediate  CONDITION: Stable to PACU  HISTORY: Jon Anderson is a 67 y.o. male initially seen in the outpatient neurosurgery clinic with right-sided back and leg pain a few months ago.  At that time, he was scheduled to undergo elective coronary artery bypass graft.  He therefore underwent a cardiac procedure without complication and is made a good recovery.  He has been cleared to proceed with surgical decompression.  The risks and benefits of the surgery were explained in detail to the patient and his family.  Alternative treatment options were also discussed.  He elected to proceed with surgery.  Informed consent was obtained and witnessed.  PROCEDURE IN DETAIL: Mr. DARIEN KADING was brought to the operating room. After induction of general anesthesia, the patient was positioned on the operative table in the prone position with all pressure points meticulously padded. The skin of the low back was then prepped and draped in the usual sterile fashion.  Under xray, the correct level was identified and marked out on the skin, and after timeout was conducted, the skin was infiltrated with local anesthetic. Skin incision was then made sharply and Bovie electrocautery was used to dissect the subcutaneous tissue until the lumbodorsal fascia was identified. The fascia was then incised using Bovie electrocautery and the lamina at the L5 and S1 levels was identified and dissection was carried out in the subperiosteal plane. Self-retaining retractor was then placed, and intraoperative x-ray was taken to confirm we were at the  correct level.  Using a high-speed drill, laminotomy was completed. The ligamentum flavum was then identified and removed and the lateral edge of the thecal sac was identified. This was then traced down to identify the traversing right S1 nerve root. Dissection was then carried out ventral epidural space to identify the disc herniation. The posterior annulus was then incised and using a combination of dissectors, curettes, and rongeurs, the herniated disc fragments were removed. The decompression of the nerve root was confirmed using a dissector by easily passing through the right L5-S1 foramen.  Hemostasis was then secured using a combination of morcellized Gelfoam and thrombin and bipolar electrocautery. The wound is irrigated with copious amounts of antibiotic saline irrigation. The nerve root was then covered with a long-acting steroid solution. Self-retaining retractor was then removed, and the wound is closed in layers using a combination of interrupted 0 Vicryl and 3-0 Vicryl stitches. The skin was closed using standard skin glue.  At the end of the case all sponge, needle, and instrument counts were correct. The patient was then transferred to the stretcher and taken to the postanesthesia care unit in stable hemodynamic condition.

## 2017-07-03 NOTE — Discharge Summary (Signed)
Physician Discharge Summary  Patient ID: Jon Anderson MRN: [id number] DOB/AGE: [birth year] 67 y.o.  Admit date: 07/03/2017 Discharge date: 07/03/2017  Admission Diagnoses: Lumbar disc herniation with radiculopathy, L5-S1  Discharge Diagnoses: Same Active Problems:   HNP (herniated nucleus pulposus), lumbar   Discharged Condition: Stable  Hospital Course:  Jon Anderson is a 67 y.o. male admitted for elective right L5-S1 laminotomy and microdiscectomy which was done without complication. Postopertively, the patient was at neurologic baseline, reporting improvement of right leg pain. Back pain was controlled with oral medication, he was ambulating without difficulty, voiding normally, and tolerating diet.  Treatments: Surgery - right L5-S1 laminotomy, microdiscectomy  Discharge Exam: Blood pressure (!) 173/83, pulse 93, temperature (!) 97.4 F (36.3 C), resp. rate 18, height 5\' 11"  (1.803 m), weight 73 kg (161 lb), SpO2 96 %. Awake, alert, oriented Speech fluent, appropriate CN grossly intact 5/5 BUE/BLE Wound c/d/i  Follow-up: Follow-up in my office [facility] [phone]) in 2-3 weeks  Disposition: Discharge disposition: 01-Home or Self Care       Discharge Instructions    Call MD for:  redness, tenderness, or signs of infection (pain, swelling, redness, odor or green/yellow discharge around incision site)   Complete by:  As directed    Call MD for:  temperature >100.4   Complete by:  As directed    Diet - low sodium heart healthy   Complete by:  As directed    Discharge instructions   Complete by:  As directed    Walk at home as much as possible, at least 4 times / day   Increase activity slowly   Complete by:  As directed    Lifting restrictions   Complete by:  As directed    No lifting > 10 lbs   May shower / Bathe   Complete by:  As directed    48 hours after surgery   May walk up steps   Complete by:  As directed    No dressing needed   Complete by:  As directed    Other Restrictions   Complete by:  As directed    No bending/twisting at waist     Allergies as of 07/03/2017      Reactions   Kiwi Extract Swelling   Facial swelling    Tape Other (See Comments)   Plastic tape-blistering      Medication List    STOP taking these medications   HYDROcodone-acetaminophen 5-325 MG tablet Commonly known as:  NORCO   traMADol 50 MG tablet Commonly known as:  ULTRAM     TAKE these medications   acetaminophen 500 MG tablet Commonly known as:  TYLENOL Take 1,000 mg by mouth 2 (two) times daily as needed for moderate pain or headache.   aspirin EC 81 MG tablet Take 1 tablet (81 mg total) by mouth daily.   atorvastatin 40 MG tablet Commonly known as:  LIPITOR Take 1 tablet (40 mg total) by mouth daily at 6 PM.   calcium acetate 667 MG capsule Commonly known as:  PHOSLO Take 3 capsules (2,001 mg total) by mouth 3 (three) times daily with meals. What changed:    how much to take  when to take this   clopidogrel 75 MG tablet Commonly known as:  PLAVIX Take 1 tablet (75 mg total) by mouth daily with breakfast. Start taking on:  07/08/2017 What changed:  These instructions start on 07/08/2017. If you are unsure what to do until  then, ask your doctor or other care provider.   gabapentin 100 MG capsule Commonly known as:  NEURONTIN Take 100 mg by mouth 2 (two) times daily.   glycerin adult 2 g suppository Place 1 suppository rectally as needed for constipation.   methocarbamol 500 MG tablet Commonly known as:  ROBAXIN Take 1 tablet (500 mg total) by mouth every 6 (six) hours as needed for muscle spasms.   metoprolol succinate 50 MG 24 hr tablet Commonly known as:  TOPROL-XL Take 1 tablet by mouth one hour prior to CTA.   multivitamin Tabs tablet Take 1 tablet by mouth at bedtime.   nitroGLYCERIN 0.4 MG SL tablet Commonly known as:  NITROSTAT Place 1 tablet (0.4 mg total) under the  tongue every 5 (five) minutes as needed for chest pain.   Oxycodone HCl 10 MG Tabs Take 1 tablet (10 mg total) by mouth every 6 (six) hours as needed for severe pain ((score 7 to 10)).   pantoprazole 40 MG tablet Commonly known as:  PROTONIX Take 40 mg by mouth daily.   polyethylene glycol packet Commonly known as:  MIRALAX / GLYCOLAX Take 17 g by mouth daily as needed for moderate constipation.   rOPINIRole 0.5 MG tablet Commonly known as:  REQUIP Take 0.5 mg by mouth See admin instructions. Take 0.5 mg by mouth once every night, take an addition 0.5 mg dose during the day 3 times a week on dialysis days      Follow-up Information    Consuella Lose, MD Follow up in 3 week(s).   Specialty:  Neurosurgery Contact information: [address] [place] [address] [phone]           Signed: Jairo Ben 07/03/2017, 1:50 PM

## 2017-07-03 NOTE — Progress Notes (Signed)
07/03/17 1818  PT Visit Information  Last PT Received On 07/03/17  Assistance Needed +1  History of Present Illness Pt is a 67 y/o male s/p L5-S1 laminotomy and microdiscectomy. PMH includes DM,, HTN, ESRD on HD MWF, PAD, OSA, CAD s/p heart cath, s/p L AV fistula placement, and s/p R foot fx surgery.   Subjective Data  Patient Stated Goal to get better   Precautions  Precautions Back  Precaution Booklet Issued Yes (comment)  Precaution Comments Pt able to recall 1/3 precautions. Reviewed precautions with pt.   Restrictions  Weight Bearing Restrictions No  Pain Assessment  Pain Assessment Faces  Faces Pain Scale 4  Pain Location back   Pain Descriptors / Indicators Aching;Operative site guarding;Grimacing  Pain Intervention(s) Limited activity within patient's tolerance;Monitored during session;Repositioned  Cognition  Arousal/Alertness Awake/alert  Behavior During Therapy WFL for tasks assessed/performed  Overall Cognitive Status Within Functional Limits for tasks assessed  Bed Mobility  Overal bed mobility Needs Assistance  Bed Mobility Sit to Sidelying;Rolling  Rolling Supervision  Sit to sidelying Supervision  General bed mobility comments Supervision to ensure log roll technique. Verbal cues for sequencing.   Transfers  Overall transfer level Needs assistance  Equipment used Rolling walker (2 wheeled)  Transfers Sit to/from Stand  Sit to Stand Supervision  General transfer comment Supervision for safety. Cues for hand placement for controlled descent.   Ambulation/Gait  Ambulation/Gait assistance Supervision  Ambulation Distance (Feet) 125 Feet  Assistive device Rolling walker (2 wheeled)  Gait Pattern/deviations Step-through pattern;Decreased stride length  General Gait Details Slow, guarded gait. Improved steadiness noted requiring supervision for safety.   Gait velocity Decreased   Gait velocity interpretation Below normal speed for age/gender  Stairs Yes  Stairs  assistance Min guard  Stair Management No rails;Step to pattern;Backwards;Forwards;With walker  Number of Stairs 2  General stair comments Reviewed/practiced stair navigation technique with pt and pt's wife. Min guard for safety. Educated about how to appropriately brace RW for safety. Gave handout for home use.   Balance  Overall balance assessment Needs assistance  Sitting-balance support No upper extremity supported;Feet supported  Sitting balance-Leahy Scale Good  Standing balance support Bilateral upper extremity supported;During functional activity  Standing balance-Leahy Scale Poor  Standing balance comment Reliant on BUE support.   General Comments  General comments (skin integrity, edema, etc.) Pt's wife present during session. Pt reports improved comfort with mobiltiy   PT - End of Session  Equipment Utilized During Treatment Gait belt  Activity Tolerance Patient tolerated treatment well  Patient left in bed;with call bell/phone within reach;with family/visitor present  Nurse Communication Mobility status   PT - Assessment/Plan  PT Plan Current plan remains appropriate  PT Visit Diagnosis Unsteadiness on feet (R26.81);Other abnormalities of gait and mobility (R26.89);Muscle weakness (generalized) (M62.81);Pain  Pain - part of body  (back )  PT Frequency (ACUTE ONLY) Min 5X/week  Recommendations for Other Services Speech consult  Follow Up Recommendations Home health PT;Supervision/Assistance - 24 hour  PT equipment 3in1 (PT)  AM-PAC PT "6 Clicks" Daily Activity Outcome Measure  Difficulty turning over in bed (including adjusting bedclothes, sheets and blankets)? 3  Difficulty moving from lying on back to sitting on the side of the bed?  3  Difficulty sitting down on and standing up from a chair with arms (e.g., wheelchair, bedside commode, etc,.)? 3  Help needed moving to and from a bed to chair (including a wheelchair)? 3  Help needed walking in hospital room? 3  Help  needed climbing 3-5 steps with a railing?  3  6 Click Score 18  Mobility G Code  CK  PT Goal Progression  Progress towards PT goals Progressing toward goals  Acute Rehab PT Goals  PT Goal Formulation With patient  Time For Goal Achievement 07/17/17  Potential to Achieve Goals Good  PT Time Calculation  PT Start Time (ACUTE ONLY) 1647  PT Stop Time (ACUTE ONLY) 1704  PT Time Calculation (min) (ACUTE ONLY) 17 min  PT General Charges  $$ ACUTE PT VISIT 1 Visit  PT Treatments  $Gait Training 8-22 mins   Returned for second session, as RN reports improved tolerance for mobility and pt needing to practice stairs prior to d/c home this evening. Noted improved steadiness during gait requiring supervision with use of RW. Reviewed safe stair management with use of RW and gave handout to pt. Required min guard for safety for stair navigation with RW. Current recommendations appropriate. Will continue to follow acutely to maximize functional mobility independence and safety.   Leighton Ruff, PT, DPT  Acute Rehabilitation Services  Pager: [phone]

## 2017-07-03 NOTE — Progress Notes (Signed)
Dr Conrad [place] in to see pt ok to d/c pt to 3c07

## 2017-07-03 NOTE — Anesthesia Procedure Notes (Signed)
Procedure Name: Intubation Date/Time: 07/03/2017 7:50 AM Performed by: Valda Favia, CRNA Pre-anesthesia Checklist: Patient identified, Emergency Drugs available, Suction available, Patient being monitored and Timeout performed Patient Re-evaluated:Patient Re-evaluated prior to induction Oxygen Delivery Method: Circle system utilized Preoxygenation: Pre-oxygenation with 100% oxygen Induction Type: IV induction Ventilation: Mask ventilation without difficulty and Oral airway inserted - appropriate to patient size Laryngoscope Size: Mac and 4 Grade View: Grade I Tube type: Oral Tube size: 7.5 mm Number of attempts: 1 Airway Equipment and Method: Stylet Placement Confirmation: ETT inserted through vocal cords under direct vision,  positive ETCO2 and breath sounds checked- equal and bilateral Secured at: 22 cm Tube secured with: Tape Dental Injury: Teeth and Oropharynx as per pre-operative assessment

## 2017-07-03 NOTE — Anesthesia Preprocedure Evaluation (Signed)
Anesthesia Evaluation  Patient identified by MRN, date of birth, ID band Patient awake    Reviewed: Allergy & Precautions, NPO status , Patient's Chart, lab work & pertinent test results  Airway Mallampati: I  TM Distance: >3 FB Neck ROM: Full    Dental   Pulmonary sleep apnea , former smoker,    Pulmonary exam normal        Cardiovascular hypertension, Pt. on medications + CAD  Normal cardiovascular exam     Neuro/Psych    GI/Hepatic GERD  Medicated and Controlled,  Endo/Other  diabetes, Type 2, Insulin Dependent  Renal/GU ESRF and DialysisRenal disease     Musculoskeletal   Abdominal   Peds  Hematology   Anesthesia Other Findings   Reproductive/Obstetrics                             Anesthesia Physical Anesthesia Plan  ASA: III  Anesthesia Plan: General   Post-op Pain Management:    Induction: Intravenous  PONV Risk Score and Plan: 2 and Ondansetron, Treatment may vary due to age or medical condition and Midazolam  Airway Management Planned: Oral ETT  Additional Equipment:   Intra-op Plan:   Post-operative Plan: Extubation in OR  Informed Consent: I have reviewed the patients History and Physical, chart, labs and discussed the procedure including the risks, benefits and alternatives for the proposed anesthesia with the patient or authorized representative who has indicated his/her understanding and acceptance.     Plan Discussed with: CRNA and Surgeon  Anesthesia Plan Comments:         Anesthesia Quick Evaluation

## 2017-07-04 ENCOUNTER — Encounter (HOSPITAL_COMMUNITY): Payer: Self-pay | Admitting: Neurosurgery

## 2017-07-04 MED FILL — Thrombin For Soln 5000 Unit: CUTANEOUS | Qty: 5000 | Status: AC

## 2017-07-10 ENCOUNTER — Ambulatory Visit (HOSPITAL_COMMUNITY)
Admission: RE | Admit: 2017-07-10 | Discharge: 2017-07-10 | Disposition: A | Discharge status: Home / Self Care | Payer: Medicare Other | Source: Ambulatory Visit | Attending: Cardiology | Admitting: Cardiology

## 2017-07-10 DIAGNOSIS — I2510 Atherosclerotic heart disease of native coronary artery without angina pectoris: Secondary | ICD-10-CM | POA: Diagnosis not present

## 2017-07-10 DIAGNOSIS — R9389 Abnormal findings on diagnostic imaging of other specified body structures: Secondary | ICD-10-CM | POA: Insufficient documentation

## 2017-07-10 DIAGNOSIS — E785 Hyperlipidemia, unspecified: Secondary | ICD-10-CM | POA: Diagnosis not present

## 2017-07-10 DIAGNOSIS — Z87891 Personal history of nicotine dependence: Secondary | ICD-10-CM | POA: Diagnosis not present

## 2017-07-10 DIAGNOSIS — I739 Peripheral vascular disease, unspecified: Secondary | ICD-10-CM | POA: Insufficient documentation

## 2017-07-10 DIAGNOSIS — I10 Essential (primary) hypertension: Secondary | ICD-10-CM | POA: Diagnosis not present

## 2017-07-10 DIAGNOSIS — E119 Type 2 diabetes mellitus without complications: Secondary | ICD-10-CM | POA: Insufficient documentation

## 2017-07-10 DIAGNOSIS — Z951 Presence of aortocoronary bypass graft: Secondary | ICD-10-CM | POA: Diagnosis not present

## 2017-07-12 ENCOUNTER — Other Ambulatory Visit: Payer: Self-pay

## 2017-07-12 DIAGNOSIS — I739 Peripheral vascular disease, unspecified: Secondary | ICD-10-CM

## 2017-07-17 ENCOUNTER — Ambulatory Visit: Payer: Medicare Other | Admitting: Orthotics

## 2017-07-17 DIAGNOSIS — Q828 Other specified congenital malformations of skin: Secondary | ICD-10-CM

## 2017-07-17 DIAGNOSIS — B351 Tinea unguium: Secondary | ICD-10-CM

## 2017-07-17 DIAGNOSIS — M2042 Other hammer toe(s) (acquired), left foot: Secondary | ICD-10-CM

## 2017-07-17 DIAGNOSIS — M2041 Other hammer toe(s) (acquired), right foot: Secondary | ICD-10-CM

## 2017-07-17 DIAGNOSIS — M79676 Pain in unspecified toe(s): Principal | ICD-10-CM

## 2017-07-17 DIAGNOSIS — E1142 Type 2 diabetes mellitus with diabetic polyneuropathy: Secondary | ICD-10-CM

## 2017-07-17 DIAGNOSIS — M2040 Other hammer toe(s) (acquired), unspecified foot: Secondary | ICD-10-CM

## 2017-07-18 ENCOUNTER — Other Ambulatory Visit: Payer: Self-pay | Admitting: Physician Assistant

## 2017-07-18 DIAGNOSIS — M5416 Radiculopathy, lumbar region: Secondary | ICD-10-CM

## 2017-07-18 NOTE — Progress Notes (Signed)
Face to face required for [facility] Orders entered

## 2017-08-28 ENCOUNTER — Ambulatory Visit (INDEPENDENT_AMBULATORY_CARE_PROVIDER_SITE_OTHER): Payer: Medicare Other | Admitting: Podiatry

## 2017-08-28 ENCOUNTER — Encounter: Payer: Self-pay | Admitting: Podiatry

## 2017-08-28 DIAGNOSIS — D689 Coagulation defect, unspecified: Secondary | ICD-10-CM | POA: Diagnosis not present

## 2017-08-28 DIAGNOSIS — Q828 Other specified congenital malformations of skin: Secondary | ICD-10-CM | POA: Diagnosis not present

## 2017-08-28 DIAGNOSIS — M79676 Pain in unspecified toe(s): Secondary | ICD-10-CM

## 2017-08-28 DIAGNOSIS — E1142 Type 2 diabetes mellitus with diabetic polyneuropathy: Secondary | ICD-10-CM | POA: Diagnosis not present

## 2017-08-28 DIAGNOSIS — B351 Tinea unguium: Secondary | ICD-10-CM

## 2017-08-28 NOTE — Progress Notes (Signed)
Presents today chief complaint of painful elongated toenails with corns and calluses.  Objective: Vital signs are stable he is alert and oriented x3 no change in neurovascular status.  No open lesions or wounds are noted.  Toenails are long thick yellow dystrophic with mycotic reactive hyper keratomas plantar aspect of the forefoot sub-first and sub-fifth bilaterally.  No open lesions or wounds are noted.  Assessment: Pain in limb secondary to onychomycosis diabetes mellitus pain in limb secondary to onychomycosis and porokeratosis.  Plan: Discussed etiology pathology conservative surgical therapies at this point in time I debrided all reactive hyperkeratosis and debrided toenails 1 through 5 bilateral.  He is also on Coumadin therapy.

## 2017-09-19 ENCOUNTER — Other Ambulatory Visit: Payer: Self-pay | Admitting: Physician Assistant

## 2017-09-19 DIAGNOSIS — M5126 Other intervertebral disc displacement, lumbar region: Secondary | ICD-10-CM

## 2017-09-24 NOTE — Progress Notes (Signed)

## 2017-09-25 ENCOUNTER — Ambulatory Visit
Admission: RE | Admit: 2017-09-25 | Discharge: 2017-09-25 | Disposition: A | Discharge status: Home / Self Care | Payer: Medicare Other | Source: Ambulatory Visit | Attending: Physician Assistant | Admitting: Physician Assistant

## 2017-09-25 DIAGNOSIS — M5126 Other intervertebral disc displacement, lumbar region: Secondary | ICD-10-CM

## 2017-10-04 ENCOUNTER — Other Ambulatory Visit: Payer: Self-pay | Admitting: Neurosurgery

## 2017-10-05 ENCOUNTER — Other Ambulatory Visit: Payer: Self-pay | Admitting: Neurosurgery

## 2017-10-15 NOTE — Pre-Procedure Instructions (Addendum)
Jon Anderson  10/15/2017      [id number] [place], [place] [address] [place] [address] Phone: [phone] Fax: [phone]    Your procedure is scheduled on July 12  Report to [facility] Admitting at [place] A.[facility].  Call this number if you have problems the morning of surgery:  [phone]   Remember:  Do not eat or drink after midnight.     Take these medicines the morning of surgery with A SIP OF WATER  acetaminophen (TYLENOL) gabapentin (NEURONTIN metoprolol succinate (TOPROL-XL) pantoprazole (PROTONIX)  FOLLOW PHYSICIANS INSTRUCTIONS ABOUT PLAVIX  7 days prior to surgery STOP taking any Aspirin(unless otherwise instructed by your surgeon), Aleve, Naproxen, Ibuprofen, Motrin, Advil, Goody's, BC's, all herbal medications, fish oil, and all vitamins  Follow your doctors instructions regarding your Aspirin.  If no instructions were given by your doctor, then you will need to call the prescribing office office to get instructions.       Do not wear jewelry  Do not wear lotions, powders, or cologne, or deodorant.  Men may shave face and neck.  Do not bring valuables to the hospital.  [facility] is not responsible for any belongings or valuables.  Contacts, dentures or bridgework may not be worn into surgery.  Leave your suitcase in the car.  After surgery it may be brought to your room.  For patients admitted to the hospital, discharge time will be determined by your treatment team.  Patients discharged the day of surgery will not be allowed to drive home.    Special instructions:   [facility]- Preparing For Surgery  Before surgery, you can play an important role. Because skin is not sterile, your skin needs to be as free of germs as possible. You can reduce the number of germs on your skin by washing with CHG (chlorahexidine gluconate) Soap before surgery.  CHG is an  antiseptic cleaner which kills germs and bonds with the skin to continue killing germs even after washing.    Oral Hygiene is also important to reduce your risk of infection.  Remember - BRUSH YOUR TEETH THE MORNING OF SURGERY WITH YOUR REGULAR TOOTHPASTE  Please do not use if you have an allergy to CHG or antibacterial soaps. If your skin becomes reddened/irritated stop using the CHG.  Do not shave (including legs and underarms) for at least 48 hours prior to first CHG shower. It is OK to shave your face.  Please follow these instructions carefully.   1. Shower the NIGHT BEFORE SURGERY and the MORNING OF SURGERY with CHG.   2. If you chose to wash your hair, wash your hair first as usual with your normal shampoo.  3. After you shampoo, rinse your hair and body thoroughly to remove the shampoo.  4. Use CHG as you would any other liquid soap. You can apply CHG directly to the skin and wash gently with a scrungie or a clean washcloth.   5. Apply the CHG Soap to your body ONLY FROM THE NECK DOWN.  Do not use on open wounds or open sores. Avoid contact with your eyes, ears, mouth and genitals (private parts). Wash Face and genitals (private parts)  with your normal soap.  6. Wash thoroughly, paying special attention to the area where your surgery will be performed.  7. Thoroughly rinse your body with warm water from the neck down.  8. DO  NOT shower/wash with your normal soap after using and rinsing off the CHG Soap.  9. Pat yourself dry with a CLEAN TOWEL.  10. Wear CLEAN PAJAMAS to bed the night before surgery, wear comfortable clothes the morning of surgery  11. Place CLEAN SHEETS on your bed the night of your first shower and DO NOT SLEEP WITH PETS.    Day of Surgery:  Do not apply any deodorants/lotions.  Please wear clean clothes to the hospital/surgery center.   Remember to brush your teeth WITH YOUR REGULAR TOOTHPASTE.    Please read over the following fact sheets that you  were given.

## 2017-10-16 ENCOUNTER — Encounter (HOSPITAL_COMMUNITY)
Admission: RE | Admit: 2017-10-16 | Discharge: 2017-10-16 | Disposition: A | Discharge status: Home / Self Care | Payer: Medicare Other | Source: Ambulatory Visit | Attending: Neurosurgery | Admitting: Neurosurgery

## 2017-10-16 ENCOUNTER — Encounter (HOSPITAL_COMMUNITY): Payer: Self-pay

## 2017-10-16 ENCOUNTER — Other Ambulatory Visit: Payer: Self-pay

## 2017-10-16 DIAGNOSIS — G4730 Sleep apnea, unspecified: Secondary | ICD-10-CM | POA: Diagnosis not present

## 2017-10-16 DIAGNOSIS — N186 End stage renal disease: Secondary | ICD-10-CM

## 2017-10-16 DIAGNOSIS — I2510 Atherosclerotic heart disease of native coronary artery without angina pectoris: Secondary | ICD-10-CM

## 2017-10-16 DIAGNOSIS — E1151 Type 2 diabetes mellitus with diabetic peripheral angiopathy without gangrene: Secondary | ICD-10-CM | POA: Diagnosis not present

## 2017-10-16 DIAGNOSIS — Z7982 Long term (current) use of aspirin: Secondary | ICD-10-CM | POA: Insufficient documentation

## 2017-10-16 DIAGNOSIS — Z01818 Encounter for other preprocedural examination: Secondary | ICD-10-CM | POA: Insufficient documentation

## 2017-10-16 DIAGNOSIS — E785 Hyperlipidemia, unspecified: Secondary | ICD-10-CM | POA: Diagnosis not present

## 2017-10-16 DIAGNOSIS — Z7902 Long term (current) use of antithrombotics/antiplatelets: Secondary | ICD-10-CM

## 2017-10-16 DIAGNOSIS — I120 Hypertensive chronic kidney disease with stage 5 chronic kidney disease or end stage renal disease: Secondary | ICD-10-CM

## 2017-10-16 DIAGNOSIS — M5126 Other intervertebral disc displacement, lumbar region: Secondary | ICD-10-CM | POA: Insufficient documentation

## 2017-10-16 DIAGNOSIS — Z794 Long term (current) use of insulin: Secondary | ICD-10-CM | POA: Diagnosis not present

## 2017-10-16 DIAGNOSIS — E1122 Type 2 diabetes mellitus with diabetic chronic kidney disease: Secondary | ICD-10-CM

## 2017-10-16 DIAGNOSIS — G2581 Restless legs syndrome: Secondary | ICD-10-CM | POA: Diagnosis not present

## 2017-10-16 DIAGNOSIS — Z79899 Other long term (current) drug therapy: Secondary | ICD-10-CM | POA: Insufficient documentation

## 2017-10-16 DIAGNOSIS — M5416 Radiculopathy, lumbar region: Secondary | ICD-10-CM | POA: Diagnosis not present

## 2017-10-16 DIAGNOSIS — Z87891 Personal history of nicotine dependence: Secondary | ICD-10-CM

## 2017-10-16 DIAGNOSIS — Z888 Allergy status to other drugs, medicaments and biological substances status: Secondary | ICD-10-CM | POA: Diagnosis not present

## 2017-10-16 DIAGNOSIS — K219 Gastro-esophageal reflux disease without esophagitis: Secondary | ICD-10-CM | POA: Insufficient documentation

## 2017-10-16 DIAGNOSIS — I5032 Chronic diastolic (congestive) heart failure: Secondary | ICD-10-CM | POA: Diagnosis not present

## 2017-10-16 DIAGNOSIS — I132 Hypertensive heart and chronic kidney disease with heart failure and with stage 5 chronic kidney disease, or end stage renal disease: Secondary | ICD-10-CM | POA: Diagnosis not present

## 2017-10-16 DIAGNOSIS — M5127 Other intervertebral disc displacement, lumbosacral region: Secondary | ICD-10-CM | POA: Diagnosis present

## 2017-10-16 DIAGNOSIS — Z951 Presence of aortocoronary bypass graft: Secondary | ICD-10-CM | POA: Diagnosis not present

## 2017-10-16 DIAGNOSIS — Z992 Dependence on renal dialysis: Secondary | ICD-10-CM | POA: Diagnosis not present

## 2017-10-16 HISTORY — DX: Other intervertebral disc displacement, lumbar region: M51.26

## 2017-10-16 LAB — BASIC METABOLIC PANEL
Anion gap: 17 — ABNORMAL HIGH (ref 5–15)
BUN: 27 mg/dL — ABNORMAL HIGH (ref 8–23)
CO2: 27 mmol/L (ref 22–32)
Calcium: 9.5 mg/dL (ref 8.9–10.3)
Chloride: 95 mmol/L — ABNORMAL LOW (ref 98–111)
Creatinine, Ser: 8.9 mg/dL — ABNORMAL HIGH (ref 0.61–1.24)
GFR calc Af Amer: 6 mL/min — ABNORMAL LOW (ref >60)
GFR calc non Af Amer: 5 mL/min — ABNORMAL LOW (ref >60)
Glucose, Bld: 101 mg/dL — ABNORMAL HIGH (ref 70–99)
Potassium: 4.4 mmol/L (ref 3.5–5.1)
Sodium: 139 mmol/L (ref 135–145)

## 2017-10-16 LAB — CBC
HCT: 43 % (ref 39.0–52.0)
Hemoglobin: 13.4 g/dL (ref 13.0–17.0)
MCH: 31.5 pg (ref 26.0–34.0)
MCHC: 31.2 g/dL (ref 30.0–36.0)
MCV: 101.2 fL — ABNORMAL HIGH (ref 78.0–100.0)
Platelets: 227 10*3/uL (ref 150–400)
RBC: 4.25 MIL/uL (ref 4.22–5.81)
RDW: 14.5 % (ref 11.5–15.5)
WBC: 8.2 10*3/uL (ref 4.0–10.5)

## 2017-10-16 LAB — HEMOGLOBIN A1C
Hgb A1c MFr Bld: 6.4 % — ABNORMAL HIGH (ref 4.8–5.6)
Mean Plasma Glucose: 136.98 mg/dL

## 2017-10-16 LAB — GLUCOSE, CAPILLARY: Glucose-Capillary: 119 mg/dL — ABNORMAL HIGH (ref 70–99)

## 2017-10-16 LAB — SURGICAL PCR SCREEN
MRSA, PCR: NEGATIVE
Staphylococcus aureus: NEGATIVE

## 2017-10-16 NOTE — Progress Notes (Signed)
PCP - Dr. Clayton Bibles  Cardiologist - Dr. Merlyn Lot- [facility]  Chest x-ray - 04/27/17 (E)  EKG - 04/27/17 (E)  Stress Test - 08/24/16 (CE)  ECHO - 03/18/17 (E)  Cardiac Cath - 04/27/17 (E)  Sleep Study - Yes- Positive- Pt sts he will repeat to determine if he needs the machine CPAP - None  LABS- 10/16/17: CBC, BMP  ASA- LD- 7/6  HA1C- 10/16/17 Fasting Blood Sugar - 125, Today 119 Checks Blood Sugar ___1-2__ times a day  Pt sts he will have dialysis on Thurs, since he will be in surgery on Fri.  Anesthesia- Yes- cardiac history  Pt denies having chest pain, sob, or fever at this time. All instructions explained to the pt, with a verbal understanding of the material. Pt agrees to go over the instructions while at home for a better understanding. The opportunity to ask questions was provided.

## 2017-10-16 NOTE — Progress Notes (Signed)
TORRIE LAFAVOR            10/15/2017                          [id number] [place], [place] [address] [place] [address] Phone: [phone] Fax: [phone]              Your procedure is scheduled on July 12            Report to [facility] Admitting at [facility] A[facility].            Call this number if you have problems the morning of surgery:            [phone]             Remember:            Do not eat or drink after midnight.             Take these medicines the morning of surgery with A SIP OF WATER  acetaminophen (TYLENOL) gabapentin (NEURONTIN metoprolol succinate (TOPROL-XL) pantoprazole (PROTONIX)  7 days prior to surgery STOP taking any Aspirin(unless otherwise instructed by your surgeon), Aleve, Naproxen, Ibuprofen, Motrin, Advil, Goody's, BC's, all herbal medications, fish oil, and all vitamins  Follow your doctors instructions regarding your Aspirin and Plavix.  If no instructions were given by your doctor, then you will need to call the prescribing office office to get instructions.  How to Manage Your Diabetes Before and After Surgery  Why is it important to control my blood sugar before and after surgery? . Improving blood sugar levels before and after surgery helps healing and can limit problems. . A way of improving blood sugar control is eating a healthy diet by: o  Eating less sugar and carbohydrates o  Increasing activity/exercise o  Talking with your doctor about reaching your blood sugar goals . High blood sugars (greater than 180 mg/dL) can raise your risk of infections and slow your recovery, so you will need to focus on controlling your diabetes during the weeks before surgery. . Make sure that the doctor who takes care of your diabetes knows about your planned surgery including the date and location.  How do I manage my blood sugar before  surgery? . Check your blood sugar at least 4 times a day, starting 2 days before surgery, to make sure that the level is not too high or low. o Check your blood sugar the morning of your surgery when you wake up and every 2 hours until you get to the Short Stay unit. . If your blood sugar is less than 70 mg/dL, you will need to treat for low blood sugar: o Do not take insulin. o Treat a low blood sugar (less than 70 mg/dL) with  cup of clear juice (cranberry or apple), 4 glucose tablets, OR glucose gel. Recheck blood sugar in 15 minutes after treatment (to make sure it is greater than 70 mg/dL). If your blood sugar is not greater than 70 mg/dL on recheck, call [phone] o  for further instructions. . Report your blood sugar to the short stay nurse when you get to Short Stay.  . If you are admitted to the hospital after surgery: o Your blood sugar will be checked by the staff and you will probably be given insulin after surgery (instead of oral diabetes  medicines) to make sure you have good blood sugar levels. o The goal for blood sugar control after surgery is 80-180 mg/dL  . If your CBG is greater than 220 mg/dL, call us at [phone]  Reviewed and Endorsed by [facility] Patient Education Committee, August 2015               Do not wear jewelry            Do not wear lotions, powders, or cologne, or deodorant.            Men may shave face and neck.            Do not bring valuables to the hospital.            [facility] is not responsible for any belongings or valuables.  Contacts, dentures or bridgework may not be worn into surgery.  Leave your suitcase in the car.  After surgery it may be brought to your room.  For patients admitted to the hospital, discharge time will be determined by your treatment team.  Patients discharged the day of surgery will not be allowed to drive home.   Special instructions:   [facility]- Preparing For Surgery  Before surgery, you can  play an important role. Because skin is not sterile, your skin needs to be as free of germs as possible. You can reduce the number of germs on your skin by washing with CHG (chlorahexidine gluconate) Soap before surgery.  CHG is an antiseptic cleaner which kills germs and bonds with the skin to continue killing germs even after washing.    Oral Hygiene is also important to reduce your risk of infection.  Remember - BRUSH YOUR TEETH THE MORNING OF SURGERY WITH YOUR REGULAR TOOTHPASTE  Please do not use if you have an allergy to CHG or antibacterial soaps. If your skin becomes reddened/irritated stop using the CHG.  Do not shave (including legs and underarms) for at least 48 hours prior to first CHG shower. It is OK to shave your face.  Please follow these instructions carefully.                                                                                                                     1. Shower the NIGHT BEFORE SURGERY and the MORNING OF SURGERY with CHG.   2. If you chose to wash your hair, wash your hair first as usual with your normal shampoo.  3. After you shampoo, rinse your hair and body thoroughly to remove the shampoo.  4. Use CHG as you would any other liquid soap. You can apply CHG directly to the skin and wash gently with a scrungie or a clean washcloth.   5. Apply the CHG Soap to your body ONLY FROM THE NECK DOWN.  Do not use on open wounds or open sores. Avoid contact with your eyes, ears, mouth and genitals (private parts). Wash Face and genitals (private parts)  with your normal soap.  6. Wash thoroughly, paying special attention to the area where your surgery will be performed.  7. Thoroughly rinse your body with warm water from the neck down.  8. DO NOT shower/wash with your normal soap after using and rinsing off the CHG Soap.  9. Pat yourself dry with a CLEAN TOWEL.  10. Wear CLEAN PAJAMAS to bed the night before surgery, wear comfortable clothes the  morning of surgery  11. Place CLEAN SHEETS on your bed the night of your first shower and DO NOT SLEEP WITH PETS.  Day of Surgery:  Do not apply any deodorants/lotions.  Please wear clean clothes to the hospital/surgery center.   Remember to brush your teeth WITH YOUR REGULAR TOOTHPASTE.  Please read over the following fact sheets that you were given.                                 Revision History

## 2017-10-17 NOTE — Progress Notes (Signed)
Anesthesia Chart Review:  Case:  [id number] Date/Time:  10/19/17 1315   Procedure:  MICRODISCECTOMY LUMBAR 5- SACRAL 1 (Right ) - MICRODISCECTOMY LUMBAR 5- SACRAL 1   Anesthesia type:  General   Pre-op diagnosis:  HERNIATED NUCLEUS PULPOSUS, LUMBAR   Location:  [facility] OR ROOM 36 / [facility]   Surgeon:  Consuella Lose, MD      DISCUSSION: 67 yo former smoker scheduled for above procedure. History includes former smoker, CAD (s/p off-pump CABG via left thoracotomy, LIMA-LAD 72/62/03, [facility]), diastolic CHF, HTN, HLD, ESRD (HD M/W/F, left AVF), GERD, DM2, anemia of chronic disease, PAD (s/p PTCA/DES left SFA 12/11/13), carotid artery stenosis, RLS, T&A, left inguinal hernia repair. Mild mitral stenosis by 03/2017 echo.  - Admission 04/27/17-04/28/17 for chest pain. Cardiac cath showed patent LIMA-LAD graft. Microvascular angina was a consideration for his anginal symptoms.   -Pt underwent L5-S1 microdiscectomy by Dr. Kathyrn Sheriff 07/03/2017. At that time he had surgical clearance from primary care, vascular, cardiology, and CT surgery. He has recent clearance to stop Plavix. I see no significant changes in his medical status since that time   He will need ISTAT on arrival since he is on hemodialysis, but if results acceptable and otherwise no acute changes then I anticipate that he can proceed as planned.  VS: BP 129/84   Pulse 77   Temp 36.8 C   Resp 20   Ht _0  (1.803 m)   Wt 164 lb 1.6 oz (74.4 kg)   SpO2 95%   BMI 22.89 kg/m   PROVIDERS: [name], MD is PCP  Donato Heinz MD is Nephrologist  Pu, Min MD is cardiologist  [name] MD is PV cardiologist   LABS: Reviewed. Consistent with pt ESRD on HD (all labs ordered are listed, but only abnormal results are displayed)  Labs Reviewed  GLUCOSE, CAPILLARY - Abnormal; Notable for the following components:      Result Value   Glucose-Capillary 119 (*)    All other components within normal limits  HEMOGLOBIN A1C - Abnormal;  Notable for the following components:   Hgb A1c MFr Bld 6.4 (*)    All other components within normal limits  BASIC METABOLIC PANEL - Abnormal; Notable for the following components:   Chloride 95 (*)    Glucose, Bld 101 (*)    BUN 27 (*)    Creatinine, Ser 8.90 (*)    GFR calc non Af Amer 5 (*)    GFR calc Af Amer 6 (*)    Anion gap 17 (*)    All other components within normal limits  CBC - Abnormal; Notable for the following components:   MCV 101.2 (*)    All other components within normal limits  SURGICAL PCR SCREEN     IMAGES: PORTABLE CHEST 1 VIEW 04/27/2017 COMPARISON:  03/23/2017 FINDINGS: The heart size and mediastinal contours are within normal limits. There is no evidence of pulmonary edema, consolidation, pneumothorax, nodule or pleural fluid. The visualized skeletal structures are unremarkable. IMPRESSION: No active disease.  EKG: 10/02/2017 [facility] care everywhere): Sinus rhythm. Poor R wave progression. When compared with ECG of 05-Feb-2017 13:15. ST no longer elevated in inferior leads. ST no longer elevated in lateral leads  04/27/17: SR, PAC, short PR, LAD, anterior infarct (old), ST/T wave abnormality. Baseline wander in limb leads. Cardiac cath performed same day, multiple old EKGs in Epic.   CV: CTA Neck 06/28/2017 IMPRESSION: Atherosclerotic plaque right carotid bifurcation. No significant internal carotid artery stenosis on the  right. 50% diameter stenosis proximal left external carotid artery. Atherosclerotic plaque throughout the right cavernous carotid.  60% diameter stenosis distal left common carotid artery and 30% diameter stenosis proximal left internal carotid artery. Moderate to severe stenosis proximal left external carotid artery. Extensive atherosclerotic disease left cavernous carotid  Scattered disease throughout the right vertebral artery which is patent. Severe stenosis at the origin of the left vertebral artery which is patent to  the basilar.  LHC 04/27/2017: -Prox LAD lesion is 75% stenosed. -Focal lesion that was previously described. -LIMA-LAD is widely patent and is normal in caliber. There is competitive flow. -Otherwise minimal disease throughout. Nothing made greater than 40% in the ostial circumflex. -The left ventricular systolic function is normal. The left ventricular ejection fraction is 50-55% by visual estimate. -LV end diastolic pressure is low. - ~0-3 mmHg -There is no aortic valve stenosis.  Echo 03/18/17: Study Conclusions - Left ventricle: The cavity size was normal. Wall thickness was increased in a pattern of moderate LVH. Systolic function was normal. The estimated ejection fraction was in the range of 55% to 60%. Wall motion was normal; there were no regional wall motion abnormalities. Features are consistent with a pseudonormal left ventricular filling pattern, with concomitant abnormal relaxation and increased filling pressure (grade 2 diastolic dysfunction). - Mitral valve: Severely calcified annulus. The findings are consistent with mild stenosis. - Left atrium: The atrium was severely dilated. Impressions: - Normal LV systolic function; moderate diastolic dysfunction; moderate LVH; severe MAC with mild MS (mean gradient 5 mmHg) and trace MR; severe LAE; mild TR.    Past Medical History:  Diagnosis Date  . Anemia of chronic disease   . CAD (coronary artery disease)    a.  Myoview 4/11: EF 53%, no scar or ischemia   c. MV 2012 Nl perfusion, apical thinning.  No ischemia or scar.  EF 49%, appears greater by visual estimate.;  d.  Dob stress echo 12/13:  Negative Dob stress echo. There is no evidence of ischemia.  The LVF is normal. b. Normal cors 2016.  . Carotid stenosis    a. <61% RICA, >60% LICA by duplex 10/3708  . Chronic chest pain    occ  . ESRD (end stage renal disease) on dialysis ([place])    M-W-F  . GERD (gastroesophageal reflux disease)   . HNP  (herniated nucleus pulposus), lumbar   . HTN (hypertension)    echo 3/10: EF 60%, LAE  . Hyperlipidemia   . Nephrolithiasis    "passed them all"  . Peripheral arterial disease ([place])    a. s/p PTCA to L SFA.  Marland Kitchen Pneumonia   . Restless legs   . Sleep apnea    no cpap, needs to reschedule appointment to set up aquiring cpap  . Snores    a. presumed OSA, pt has refused sleep eval in past.  . Type II diabetes mellitus ([place])    no longer on medications, checks blood glucose at home    Past Surgical History:  Procedure Laterality Date  . ANGIOPLASTY / STENTING FEMORAL Left 12/11/2013   dr berry  . AV FISTULA PLACEMENT Left 03/19/2014   Procedure: CREATION OF ARTERIOVENOUS (AV) FISTULA  LEFT UPPER ARM;  Surgeon: Mal Misty, MD;  Location: [facility];  Service: Vascular;  Laterality: Left;  . CARDIAC CATHETERIZATION  2001 and 2010   . COLONOSCOPY W/ BIOPSIES AND POLYPECTOMY    . COLONOSCOPY WITH PROPOFOL N/A 08/01/2016   Procedure: COLONOSCOPY WITH PROPOFOL;  Surgeon:  Carol Ada, MD;  Location: [facility];  Service: Endoscopy;  Laterality: N/A;  . ESOPHAGOGASTRODUODENOSCOPY (EGD) WITH PROPOFOL N/A 08/01/2016   Procedure: ESOPHAGOGASTRODUODENOSCOPY (EGD) WITH PROPOFOL;  Surgeon: Carol Ada, MD;  Location: [facility];  Service: Endoscopy;  Laterality: N/A;  . FOOT FRACTURE SURGERY Right   . FRACTURE SURGERY    . INGUINAL HERNIA REPAIR Left   . LEFT HEART CATH AND CORS/GRAFTS ANGIOGRAPHY N/A 04/27/2017   Procedure: LEFT HEART CATH AND CORS/GRAFTS ANGIOGRAPHY;  Surgeon: Leonie Man, MD;  Location: [facility] CV LAB;  Service: Cardiovascular;  Laterality: N/A;  . LEFT HEART CATHETERIZATION WITH CORONARY ANGIOGRAM N/A 06/22/2014   Procedure: LEFT HEART CATHETERIZATION WITH CORONARY ANGIOGRAM;  Surgeon: Troy Sine, MD;  Location: [facility] CATH LAB;  Service: Cardiovascular;  Laterality: N/A;  . LOWER EXTREMITY ANGIOGRAM Left 12/11/2013   Procedure: LOWER EXTREMITY ANGIOGRAM;  Surgeon:  Lorretta Harp, MD;  Location: [facility] CATH LAB;  Service: Cardiovascular;  Laterality: Left;  . LUMBAR LAMINECTOMY/DECOMPRESSION MICRODISCECTOMY Right 07/03/2017   Procedure: MICRODISCECTOMY LUMBAR FIVE - SACRAL ONE RIGHT;  Surgeon: Consuella Lose, MD;  Location: [facility];  Service: Neurosurgery;  Laterality: Right;  . TONSILLECTOMY AND ADENOIDECTOMY      MEDICATIONS: . acetaminophen (TYLENOL) 500 MG tablet  . aspirin EC 81 MG tablet  . atorvastatin (LIPITOR) 40 MG tablet  . calcium acetate (PHOSLO) 667 MG capsule  . clopidogrel (PLAVIX) 75 MG tablet  . cyclobenzaprine (FLEXERIL) 10 MG tablet  . diclofenac (VOLTAREN) 75 MG EC tablet  . gabapentin (NEURONTIN) 100 MG capsule  . glycerin adult 2 g suppository  . methocarbamol (ROBAXIN) 500 MG tablet  . metoprolol succinate (TOPROL-XL) 50 MG 24 hr tablet  . multivitamin (RENA-VIT) TABS tablet  . nitroGLYCERIN (NITROSTAT) 0.4 MG SL tablet  . oxyCODONE 10 MG TABS  . pantoprazole (PROTONIX) 40 MG tablet  . polyethylene glycol (MIRALAX / GLYCOLAX) packet  . rOPINIRole (REQUIP) 0.5 MG tablet   No current facility-administered medications for this encounter.    Wynonia [facility] Short Stay Center/Anesthesiology Phone [phone] 10/17/2017 11:06 AM

## 2017-10-18 NOTE — H&P (Signed)
Chief Complaint   Low back pain  HPI   HPI: Jon Anderson is a 67 y.o. male who underwent right L5-S1 laminotomy and microdiskectomy in March 2019.  He did have some improvement in his back and leg pain initially postoperatively, but relatively quickly began complaining of recurrent right-sided leg pain.    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  He presents today for repeat surgery.  He is without any concerns.  Patient Active Problem List   Diagnosis Date Noted  . HNP (herniated nucleus pulposus), lumbar 07/03/2017  . Chest pain in adult 04/27/2017  . Restless leg syndrome, uncontrolled 04/27/2017  . 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 Anemia of chronic disease 03/17/2017  . Severe uncontrolled hypertension 03/17/2017  . ESRD (end stage renal disease) on dialysis ([place]) 03/17/2017  . Hyperlipidemia 03/17/2017  . Chronic chest pain 03/17/2017  . Type II diabetes mellitus ([place]) 03/17/2017  . Acute on chronic diastolic heart failure ([place]) 03/17/2017  . Acute heart failure ([place]) 03/17/2017  . Lumbar radiculopathy 01/16/2017  . Chest pain, non-cardiac 10/09/2015  . Acute on chronic renal failure ([place]) 06/16/2014  . Renal failure (ARF), acute on chronic (HCC) 06/16/2014  . Shoulder pain, left 12/15/2013  . Chest pain 12/15/2013  . Claudication ([place]) 12/11/2013  . PVD (peripheral vascular disease) ([place]) 12/11/2013  . Peripheral arterial disease ([place]) 09/30/2013  . Carotid artery disease ([place]) 09/30/2013  . Acute chest pain 11/15/2012  . Left-sided chest wall pain 03/19/2012  . Bruit 09/15/2010  . CAD (coronary artery disease) nonobstructive per cath 2012   . Chronic kidney disease (CKD), stage IV (severe) ([place])   . Precordial chest pain 07/26/2009  . DM (diabetes mellitus) ([place]) 07/22/2009  . Hyperlipidemia LDL goal <70 07/22/2009  . Hypertension 07/22/2009    PMH: Past Medical History:  Diagnosis Date   . Anemia of chronic disease   . CAD (coronary artery disease)    a.  Myoview 4/11: EF 53%, no scar or ischemia   c. MV 2012 Nl perfusion, apical thinning.  No ischemia or scar.  EF 49%, appears greater by visual estimate.;  d.  Dob stress echo 12/13:  Negative Dob stress echo. There is no evidence of ischemia.  The LVF is normal. b. Normal cors 2016.  . Carotid stenosis    a. <45% RICA, >80% LICA by duplex 12/9831  . Chronic chest pain    occ  . ESRD (end stage renal disease) on dialysis ([place])    M-W-F  . GERD (gastroesophageal reflux disease)   . HNP (herniated nucleus pulposus), lumbar   . HTN (hypertension)    echo 3/10: EF 60%, LAE  . Hyperlipidemia   . Nephrolithiasis    "passed them all"  . Peripheral arterial disease ([place])    a. s/p PTCA to L SFA.  Marland Kitchen Pneumonia   . Restless legs   . Sleep apnea    no cpap, needs to reschedule appointment to set up aquiring cpap  . Snores    a. presumed OSA, pt has refused sleep eval in past.  . Type II diabetes mellitus ([place])    no longer on medications, checks blood glucose at home    PSH: Past Surgical History:  Procedure Laterality Date  . ANGIOPLASTY / STENTING FEMORAL Left 12/11/2013   dr berry  . AV FISTULA PLACEMENT Left 03/19/2014   Procedure: CREATION OF ARTERIOVENOUS (AV) FISTULA  LEFT UPPER ARM;  Surgeon: Mal Misty, MD;  Location: [facility];  Service: Vascular;  Laterality: Left;  . CARDIAC CATHETERIZATION  2001 and 2010   . COLONOSCOPY W/ BIOPSIES AND POLYPECTOMY    . COLONOSCOPY WITH PROPOFOL N/A 08/01/2016   Procedure: COLONOSCOPY WITH PROPOFOL;  Surgeon: Carol Ada, MD;  Location: [facility];  Service: Endoscopy;  Laterality: N/A;  . ESOPHAGOGASTRODUODENOSCOPY (EGD) WITH PROPOFOL N/A 08/01/2016   Procedure: ESOPHAGOGASTRODUODENOSCOPY (EGD) WITH PROPOFOL;  Surgeon: Carol Ada, MD;  Location: [facility];  Service: Endoscopy;  Laterality: N/A;  . FOOT FRACTURE SURGERY Right   . FRACTURE SURGERY    . INGUINAL HERNIA  REPAIR Left   . LEFT HEART CATH AND CORS/GRAFTS ANGIOGRAPHY N/A 04/27/2017   Procedure: LEFT HEART CATH AND CORS/GRAFTS ANGIOGRAPHY;  Surgeon: Leonie Man, MD;  Location: [facility];  Service: Cardiovascular;  Laterality: N/A;  . LEFT HEART CATHETERIZATION WITH CORONARY ANGIOGRAM N/A 06/22/2014   Procedure: LEFT HEART CATHETERIZATION WITH CORONARY ANGIOGRAM;  Surgeon: Troy Sine, MD;  Location: [facility] CATH LAB;  Service: Cardiovascular;  Laterality: N/A;  . LOWER EXTREMITY ANGIOGRAM Left 12/11/2013   Procedure: LOWER EXTREMITY ANGIOGRAM;  Surgeon: Lorretta Harp, MD;  Location: [facility];  Service: Cardiovascular;  Laterality: Left;  . LUMBAR LAMINECTOMY/DECOMPRESSION MICRODISCECTOMY Right 07/03/2017   Procedure: MICRODISCECTOMY LUMBAR FIVE - SACRAL ONE RIGHT;  Surgeon: Consuella Lose, MD;  Location: [facility];  Service: Neurosurgery;  Laterality: Right;  . TONSILLECTOMY AND ADENOIDECTOMY      No medications prior to admission.    SH: Social History   Tobacco Use  . Smoking status: Former Smoker    Packs/day: 1.00    Years: 2.00    Pack years: 2.00    Types: Cigarettes  . Smokeless tobacco: Never Used  . Tobacco comment: quit smoking 40 yrs ago  Substance Use Topics  . Alcohol use: No    Alcohol/week: 0.0 oz  . Drug use: No    MEDS: Prior to Admission medications   Medication Sig Start Date End Date Taking? Authorizing Provider  acetaminophen (TYLENOL) 500 MG tablet Take 1,000 mg by mouth 2 (two) times daily as needed for moderate pain or headache.   Yes [provider]  aspirin EC 81 MG tablet Take 1 tablet (81 mg total) by mouth daily. 05/28/12  Yes Weaver, Scott T, PA-C  atorvastatin (LIPITOR) 40 MG tablet Take 1 tablet (40 mg total) by mouth daily at 6 PM. 12/13/13  Yes Brett Canales, PA-C  calcium acetate (PHOSLO) 667 MG capsule Take 3 capsules (2,001 mg total) by mouth 3 (three) times daily with meals. Patient taking differently: Take 1,334 mg by mouth  2 (two) times daily with a meal.  10/09/15  Yes Short, Noah Delaine, MD  clopidogrel (PLAVIX) 75 MG tablet Take 1 tablet (75 mg total) by mouth daily with breakfast. 07/08/17  Yes Consuella Lose, MD  cyclobenzaprine (FLEXERIL) 10 MG tablet Take 10 mg by mouth 3 (three) times daily.   Yes [provider]  diclofenac (VOLTAREN) 75 MG EC tablet Take 75 mg by mouth 2 (two) times daily.   Yes [provider]  gabapentin (NEURONTIN) 100 MG capsule Take 100 mg by mouth 2 (two) times daily.   Yes [provider]  glycerin adult 2 g suppository Place 1 suppository rectally as needed for constipation.   Yes [provider]  multivitamin (RENA-VIT) TABS tablet Take 1 tablet by mouth at bedtime. 06/23/14  Yes Domenic Polite, MD  nitroGLYCERIN Delilah Shan)  0.4 MG SL tablet Place 1 tablet (0.4 mg total) under the tongue every 5 (five) minutes as needed for chest pain. 03/20/12  Yes Barrett, Evelene Croon, PA-C  pantoprazole (PROTONIX) 40 MG tablet Take 40 mg by mouth daily.   Yes [provider]  polyethylene glycol (MIRALAX / GLYCOLAX) packet Take 17 g by mouth daily as needed for moderate constipation. 03/22/17  Yes Aline August, MD  rOPINIRole (REQUIP) 0.5 MG tablet Take 0.5 mg by mouth 2 (two) times daily.    Yes [provider]  methocarbamol (ROBAXIN) 500 MG tablet Take 1 tablet (500 mg total) by mouth every 6 (six) hours as needed for muscle spasms. Patient not taking: Reported on 10/09/2017 07/03/17   Consuella Lose, MD  metoprolol succinate (TOPROL-XL) 50 MG 24 hr tablet Take 1 tablet by mouth one hour prior to CTA. Patient not taking: Reported on 10/09/2017 06/28/17   Lorretta Harp, MD  oxyCODONE 10 MG TABS Take 1 tablet (10 mg total) by mouth every 6 (six) hours as needed for severe pain ((score 7 to 10)). Patient not taking: Reported on 10/09/2017 07/03/17   Consuella Lose, MD    ALLERGY: Allergies  Allergen Reactions  . Kiwi Extract Swelling and  Other (See Comments)    Facial swelling   . Tape Other (See Comments)    Plastic tape-blistering    Social History   Tobacco Use  . Smoking status: Former Smoker    Packs/day: 1.00    Years: 2.00    Pack years: 2.00    Types: Cigarettes  . Smokeless tobacco: Never Used  . Tobacco comment: quit smoking 40 yrs ago  Substance Use Topics  . Alcohol use: No    Alcohol/week: 0.0 oz     Family History  Problem Relation Age of Onset  . Heart attack Sister        died @ 17  . Cancer Mother        died @ 68; unknown type  . Diabetes Brother        deceased  . Cirrhosis Father        alcohol related  . Diabetes Father   . Esophageal cancer Neg Hx   . Colon cancer Neg Hx   . Pancreatic cancer Neg Hx   . Stomach cancer Neg Hx      ROS   ROS  Exam   There were no vitals filed for this visit. General appearance: WDWN, NAD Eyes: PERRL, Fundoscopic: normal Cardiovascular: Regular rate and rhythm without murmurs, rubs, gallops. No edema or variciosities. Distal pulses normal. Pulmonary: Clear to auscultation Musculoskeletal:     Muscle tone upper extremities: Normal    Muscle tone lower extremities: Normal    Motor exam: Upper Extremities Deltoid Bicep Tricep Grip  Right 5/5 5/5 5/5 5/5  Left 5/5 5/5 5/5 5/5   Lower Extremity IP Quad PF DF EHL  Right 5/5 5/5 5/5 5/5 5/5  Left 5/5 5/5 5/5 5/5 5/5   Neurological Awake, alert, oriented Memory and concentration grossly intact Speech fluent, appropriate CNII: Visual fields normal CNIII/IV/VI: EOMI CNV: Facial sensation normal CNVII: Symmetric, normal strength CNVIII: Grossly normal CNIX: Normal palate movement CNXI: Trap and SCM strength normal CN XII: Tongue protrusion normal Sensation grossly intact to LT DTR: Normal Coordination (finger/nose & heel/shin): Normal  Results - Imaging/Labs   Repeat MRI of the lumbar spine without contrast dated 09/25/2017 was reviewed and compared to prior MRI of October 2018.  This does demonstrate  recurrent disc herniation in the right lateral recess, with slight superior migration behind the L5 vertebral body. Overall, disc herniation appears to be somewhat smaller than it was previously but continues to cause significant lateral recess stenosis and likely compression of the right S1 nerve root    Impression/Plan   67 y.o. male with recurrent back and leg pain related to recurrent disc herniation on the right at L5-S1.  While in the office, the risks of surgery including alternatives were discussed.   We will plan on proceeding with repeat laminotomy and microdiskectomy on the right at L5-S1.  All questions answered.  Consent has been signed.

## 2017-10-19 ENCOUNTER — Encounter (HOSPITAL_COMMUNITY)
Admission: RE | Disposition: A | Discharge status: Home / Self Care | Payer: Self-pay | Source: Ambulatory Visit | Attending: Neurosurgery

## 2017-10-19 ENCOUNTER — Observation Stay (HOSPITAL_COMMUNITY)
Admission: RE | Admit: 2017-10-19 | Discharge: 2017-10-20 | Disposition: A | Discharge status: Home / Self Care | Payer: Medicare Other | Source: Ambulatory Visit | Attending: Neurosurgery | Admitting: Neurosurgery

## 2017-10-19 ENCOUNTER — Ambulatory Visit (HOSPITAL_COMMUNITY): Payer: Medicare Other | Admitting: Physician Assistant

## 2017-10-19 ENCOUNTER — Ambulatory Visit (HOSPITAL_COMMUNITY): Payer: Medicare Other

## 2017-10-19 ENCOUNTER — Other Ambulatory Visit: Payer: Self-pay

## 2017-10-19 ENCOUNTER — Ambulatory Visit (HOSPITAL_COMMUNITY): Payer: Medicare Other | Admitting: Critical Care Medicine

## 2017-10-19 ENCOUNTER — Encounter (HOSPITAL_COMMUNITY): Payer: Self-pay | Admitting: *Deleted

## 2017-10-19 DIAGNOSIS — Z794 Long term (current) use of insulin: Secondary | ICD-10-CM | POA: Insufficient documentation

## 2017-10-19 DIAGNOSIS — Z992 Dependence on renal dialysis: Secondary | ICD-10-CM | POA: Insufficient documentation

## 2017-10-19 DIAGNOSIS — E1151 Type 2 diabetes mellitus with diabetic peripheral angiopathy without gangrene: Secondary | ICD-10-CM | POA: Insufficient documentation

## 2017-10-19 DIAGNOSIS — Z888 Allergy status to other drugs, medicaments and biological substances status: Secondary | ICD-10-CM | POA: Insufficient documentation

## 2017-10-19 DIAGNOSIS — M5127 Other intervertebral disc displacement, lumbosacral region: Principal | ICD-10-CM | POA: Insufficient documentation

## 2017-10-19 DIAGNOSIS — G2581 Restless legs syndrome: Secondary | ICD-10-CM | POA: Diagnosis not present

## 2017-10-19 DIAGNOSIS — M5126 Other intervertebral disc displacement, lumbar region: Secondary | ICD-10-CM | POA: Diagnosis present

## 2017-10-19 DIAGNOSIS — I5032 Chronic diastolic (congestive) heart failure: Secondary | ICD-10-CM | POA: Insufficient documentation

## 2017-10-19 DIAGNOSIS — Z951 Presence of aortocoronary bypass graft: Secondary | ICD-10-CM | POA: Insufficient documentation

## 2017-10-19 DIAGNOSIS — M5416 Radiculopathy, lumbar region: Secondary | ICD-10-CM | POA: Insufficient documentation

## 2017-10-19 DIAGNOSIS — E1122 Type 2 diabetes mellitus with diabetic chronic kidney disease: Secondary | ICD-10-CM | POA: Insufficient documentation

## 2017-10-19 DIAGNOSIS — I132 Hypertensive heart and chronic kidney disease with heart failure and with stage 5 chronic kidney disease, or end stage renal disease: Secondary | ICD-10-CM | POA: Diagnosis not present

## 2017-10-19 DIAGNOSIS — E785 Hyperlipidemia, unspecified: Secondary | ICD-10-CM | POA: Insufficient documentation

## 2017-10-19 DIAGNOSIS — Z79899 Other long term (current) drug therapy: Secondary | ICD-10-CM | POA: Insufficient documentation

## 2017-10-19 DIAGNOSIS — I2510 Atherosclerotic heart disease of native coronary artery without angina pectoris: Secondary | ICD-10-CM | POA: Insufficient documentation

## 2017-10-19 DIAGNOSIS — Z7902 Long term (current) use of antithrombotics/antiplatelets: Secondary | ICD-10-CM | POA: Insufficient documentation

## 2017-10-19 DIAGNOSIS — Z7982 Long term (current) use of aspirin: Secondary | ICD-10-CM | POA: Insufficient documentation

## 2017-10-19 DIAGNOSIS — Z419 Encounter for procedure for purposes other than remedying health state, unspecified: Secondary | ICD-10-CM

## 2017-10-19 DIAGNOSIS — G4730 Sleep apnea, unspecified: Secondary | ICD-10-CM | POA: Insufficient documentation

## 2017-10-19 DIAGNOSIS — Z87891 Personal history of nicotine dependence: Secondary | ICD-10-CM | POA: Insufficient documentation

## 2017-10-19 DIAGNOSIS — N186 End stage renal disease: Secondary | ICD-10-CM | POA: Insufficient documentation

## 2017-10-19 HISTORY — PX: LUMBAR LAMINECTOMY/DECOMPRESSION MICRODISCECTOMY: SHX5026

## 2017-10-19 LAB — GLUCOSE, CAPILLARY
Glucose-Capillary: 97 mg/dL (ref 70–99)
Glucose-Capillary: 95 mg/dL (ref 70–99)

## 2017-10-19 LAB — POCT I-STAT 4, (NA,K, GLUC, HGB,HCT)
Glucose, Bld: 93 mg/dL (ref 70–99)
HCT: 38 % — ABNORMAL LOW (ref 39.0–52.0)
Hemoglobin: 12.9 g/dL — ABNORMAL LOW (ref 13.0–17.0)
Potassium: 4 mmol/L (ref 3.5–5.1)
Sodium: 137 mmol/L (ref 135–145)

## 2017-10-19 SURGERY — LUMBAR LAMINECTOMY/DECOMPRESSION MICRODISCECTOMY 1 LEVEL
Anesthesia: General | Site: Back | Laterality: Right

## 2017-10-19 MED ORDER — OXYCODONE HCL 5 MG PO TABS
10.0000 mg | ORAL_TABLET | ORAL | Status: DC | PRN
  Administered 2017-10-20 (×2): 10 mg via ORAL
  Filled 2017-10-19 (×2): qty 2

## 2017-10-19 MED ORDER — THROMBIN 5000 UNITS EX SOLR
CUTANEOUS | Status: DC | PRN
  Administered 2017-10-19 (×2): 5000 [IU] via TOPICAL

## 2017-10-19 MED ORDER — DEXAMETHASONE SODIUM PHOSPHATE 10 MG/ML IJ SOLN
INTRAMUSCULAR | Status: AC
  Filled 2017-10-19: qty 2

## 2017-10-19 MED ORDER — ONDANSETRON HCL 4 MG/2ML IJ SOLN: 4.0000 mg | Freq: Four times a day (QID) | INTRAMUSCULAR | Status: DC | PRN

## 2017-10-19 MED ORDER — CYCLOBENZAPRINE HCL 10 MG PO TABS
10.0000 mg | ORAL_TABLET | Freq: Three times a day (TID) | ORAL | Status: DC | PRN
  Administered 2017-10-19 – 2017-10-20 (×2): 10 mg via ORAL
  Filled 2017-10-19 (×2): qty 1

## 2017-10-19 MED ORDER — OXYCODONE HCL 5 MG PO TABS
5.0000 mg | ORAL_TABLET | ORAL | Status: DC | PRN
  Administered 2017-10-19 (×3): 5 mg via ORAL
  Filled 2017-10-19 (×2): qty 1

## 2017-10-19 MED ORDER — 0.9 % SODIUM CHLORIDE (POUR BTL) OPTIME
TOPICAL | Status: DC | PRN
  Administered 2017-10-19: 1000 mL

## 2017-10-19 MED ORDER — LIDOCAINE-EPINEPHRINE 1 %-1:100000 IJ SOLN
INTRAMUSCULAR | Status: DC | PRN
  Administered 2017-10-19: 4.5 mL

## 2017-10-19 MED ORDER — METHYLPREDNISOLONE ACETATE 80 MG/ML IJ SUSP
INTRAMUSCULAR | Status: AC
  Filled 2017-10-19: qty 1

## 2017-10-19 MED ORDER — SODIUM CHLORIDE 0.9% FLUSH: 3.0000 mL | INTRAVENOUS | Status: DC | PRN

## 2017-10-19 MED ORDER — SODIUM CHLORIDE 0.9% FLUSH: 3.0000 mL | Freq: Two times a day (BID) | INTRAVENOUS | Status: DC

## 2017-10-19 MED ORDER — BUPIVACAINE HCL (PF) 0.5 % IJ SOLN
INTRAMUSCULAR | Status: AC
  Filled 2017-10-19: qty 30

## 2017-10-19 MED ORDER — ESMOLOL HCL 100 MG/10ML IV SOLN
INTRAVENOUS | Status: AC
  Filled 2017-10-19: qty 10

## 2017-10-19 MED ORDER — SUGAMMADEX SODIUM 200 MG/2ML IV SOLN
INTRAVENOUS | Status: DC | PRN
  Administered 2017-10-19: 150 mg via INTRAVENOUS

## 2017-10-19 MED ORDER — SODIUM CHLORIDE 0.9 % IV SOLN: INTRAVENOUS | Status: DC

## 2017-10-19 MED ORDER — GLYCERIN (LAXATIVE) 2.1 G RE SUPP
1.0000 | RECTAL | Status: DC | PRN
  Filled 2017-10-19: qty 1

## 2017-10-19 MED ORDER — ONDANSETRON HCL 4 MG/2ML IJ SOLN
INTRAMUSCULAR | Status: AC
  Filled 2017-10-19: qty 4

## 2017-10-19 MED ORDER — RENA-VITE PO TABS
1.0000 | ORAL_TABLET | Freq: Every day | ORAL | Status: DC
  Administered 2017-10-19: 1 via ORAL
  Filled 2017-10-19: qty 1

## 2017-10-19 MED ORDER — SUGAMMADEX SODIUM 500 MG/5ML IV SOLN
INTRAVENOUS | Status: AC
  Filled 2017-10-19: qty 5

## 2017-10-19 MED ORDER — SENNA 8.6 MG PO TABS
1.0000 | ORAL_TABLET | Freq: Two times a day (BID) | ORAL | Status: DC
  Administered 2017-10-19: 8.6 mg via ORAL
  Filled 2017-10-19: qty 1

## 2017-10-19 MED ORDER — PHENYLEPHRINE 40 MCG/ML (10ML) SYRINGE FOR IV PUSH (FOR BLOOD PRESSURE SUPPORT)
PREFILLED_SYRINGE | INTRAVENOUS | Status: DC | PRN
  Administered 2017-10-19: 120 ug via INTRAVENOUS
  Administered 2017-10-19: 40 ug via INTRAVENOUS

## 2017-10-19 MED ORDER — SODIUM CHLORIDE 0.9 % IV SOLN
INTRAVENOUS | Status: DC | PRN
  Administered 2017-10-19: 25 ug/min via INTRAVENOUS

## 2017-10-19 MED ORDER — FENTANYL CITRATE (PF) 250 MCG/5ML IJ SOLN
INTRAMUSCULAR | Status: AC
  Filled 2017-10-19: qty 5

## 2017-10-19 MED ORDER — PHENOL 1.4 % MT LIQD: 1.0000 | OROMUCOSAL | Status: DC | PRN

## 2017-10-19 MED ORDER — THROMBIN 5000 UNITS EX SOLR
CUTANEOUS | Status: AC
  Filled 2017-10-19: qty 15000

## 2017-10-19 MED ORDER — ONDANSETRON HCL 4 MG PO TABS: 4.0000 mg | ORAL_TABLET | Freq: Four times a day (QID) | ORAL | Status: DC | PRN

## 2017-10-19 MED ORDER — MIDAZOLAM HCL 2 MG/2ML IJ SOLN
INTRAMUSCULAR | Status: AC
  Filled 2017-10-19: qty 2

## 2017-10-19 MED ORDER — FENTANYL CITRATE (PF) 100 MCG/2ML IJ SOLN
25.0000 ug | INTRAMUSCULAR | Status: DC | PRN
  Administered 2017-10-19: 25 ug via INTRAVENOUS

## 2017-10-19 MED ORDER — CEFAZOLIN SODIUM-DEXTROSE 2-4 GM/100ML-% IV SOLN: 2.0000 g | Freq: Three times a day (TID) | INTRAVENOUS | Status: DC

## 2017-10-19 MED ORDER — LIDOCAINE 2% (20 MG/ML) 5 ML SYRINGE
INTRAMUSCULAR | Status: DC | PRN
  Administered 2017-10-19: 100 mg via INTRAVENOUS

## 2017-10-19 MED ORDER — FENTANYL CITRATE (PF) 100 MCG/2ML IJ SOLN
INTRAMUSCULAR | Status: AC
  Filled 2017-10-19: qty 2

## 2017-10-19 MED ORDER — NITROGLYCERIN 0.4 MG SL SUBL: 0.4000 mg | SUBLINGUAL_TABLET | SUBLINGUAL | Status: DC | PRN

## 2017-10-19 MED ORDER — SODIUM CHLORIDE 0.9 % IV SOLN
INTRAVENOUS | Status: DC
  Administered 2017-10-19 (×2): via INTRAVENOUS

## 2017-10-19 MED ORDER — MENTHOL 3 MG MT LOZG: 1.0000 | LOZENGE | OROMUCOSAL | Status: DC | PRN

## 2017-10-19 MED ORDER — ARTIFICIAL TEARS OPHTHALMIC OINT
TOPICAL_OINTMENT | OPHTHALMIC | Status: AC
  Filled 2017-10-19: qty 3.5

## 2017-10-19 MED ORDER — HEMOSTATIC AGENTS (NO CHARGE) OPTIME
TOPICAL | Status: DC | PRN
Start: 2017-10-19 — End: 2017-10-19
  Administered 2017-10-19 (×2): 1 via TOPICAL

## 2017-10-19 MED ORDER — ROCURONIUM BROMIDE 10 MG/ML (PF) SYRINGE
PREFILLED_SYRINGE | INTRAVENOUS | Status: DC | PRN
  Administered 2017-10-19: 60 mg via INTRAVENOUS

## 2017-10-19 MED ORDER — CYCLOBENZAPRINE HCL 10 MG PO TABS: 10.0000 mg | ORAL_TABLET | Freq: Three times a day (TID) | ORAL | Status: DC

## 2017-10-19 MED ORDER — MIDAZOLAM HCL 5 MG/5ML IJ SOLN
INTRAMUSCULAR | Status: DC | PRN
  Administered 2017-10-19: 1 mg via INTRAVENOUS

## 2017-10-19 MED ORDER — ACETAMINOPHEN 500 MG PO TABS: 1000.0000 mg | ORAL_TABLET | Freq: Two times a day (BID) | ORAL | Status: DC | PRN

## 2017-10-19 MED ORDER — BISACODYL 10 MG RE SUPP: 10.0000 mg | Freq: Every day | RECTAL | Status: DC | PRN

## 2017-10-19 MED ORDER — PANTOPRAZOLE SODIUM 40 MG PO TBEC
40.0000 mg | DELAYED_RELEASE_TABLET | Freq: Every day | ORAL | Status: DC
  Administered 2017-10-19 – 2017-10-20 (×2): 40 mg via ORAL
  Filled 2017-10-19 (×2): qty 1

## 2017-10-19 MED ORDER — PROPOFOL 10 MG/ML IV BOLUS
INTRAVENOUS | Status: DC | PRN
  Administered 2017-10-19: 140 mg via INTRAVENOUS

## 2017-10-19 MED ORDER — CEFAZOLIN SODIUM 1 G IJ SOLR
INTRAMUSCULAR | Status: AC
  Filled 2017-10-19: qty 20

## 2017-10-19 MED ORDER — CHLORHEXIDINE GLUCONATE CLOTH 2 % EX PADS: 6.0000 | MEDICATED_PAD | Freq: Once | CUTANEOUS | Status: DC

## 2017-10-19 MED ORDER — ROCURONIUM BROMIDE 10 MG/ML (PF) SYRINGE
PREFILLED_SYRINGE | INTRAVENOUS | Status: AC
  Filled 2017-10-19: qty 10

## 2017-10-19 MED ORDER — ATORVASTATIN CALCIUM 20 MG PO TABS
40.0000 mg | ORAL_TABLET | Freq: Every day | ORAL | Status: DC
  Administered 2017-10-19: 40 mg via ORAL
  Filled 2017-10-19: qty 2

## 2017-10-19 MED ORDER — BUPIVACAINE HCL (PF) 0.5 % IJ SOLN
INTRAMUSCULAR | Status: DC | PRN
  Administered 2017-10-19: 4.5 mL

## 2017-10-19 MED ORDER — SODIUM CHLORIDE 0.9 % IV SOLN
INTRAVENOUS | Status: DC | PRN
  Administered 2017-10-19: 14:00:00

## 2017-10-19 MED ORDER — CALCIUM ACETATE (PHOS BINDER) 667 MG PO CAPS
1334.0000 mg | ORAL_CAPSULE | Freq: Two times a day (BID) | ORAL | Status: DC
  Administered 2017-10-19 – 2017-10-20 (×2): 1334 mg via ORAL
  Filled 2017-10-19 (×2): qty 2

## 2017-10-19 MED ORDER — LACTATED RINGERS IV SOLN: INTRAVENOUS | Status: DC

## 2017-10-19 MED ORDER — ROPINIROLE HCL 1 MG PO TABS
0.5000 mg | ORAL_TABLET | Freq: Two times a day (BID) | ORAL | Status: DC
  Administered 2017-10-19 – 2017-10-20 (×2): 0.5 mg via ORAL
  Filled 2017-10-19 (×2): qty 1

## 2017-10-19 MED ORDER — POLYETHYLENE GLYCOL 3350 17 G PO PACK: 17.0000 g | PACK | Freq: Every day | ORAL | Status: DC | PRN

## 2017-10-19 MED ORDER — DICLOFENAC SODIUM 75 MG PO TBEC
75.0000 mg | DELAYED_RELEASE_TABLET | Freq: Two times a day (BID) | ORAL | Status: DC
  Administered 2017-10-19 – 2017-10-20 (×2): 75 mg via ORAL
  Filled 2017-10-19 (×2): qty 1

## 2017-10-19 MED ORDER — ESMOLOL HCL 100 MG/10ML IV SOLN
INTRAVENOUS | Status: DC | PRN
  Administered 2017-10-19 (×2): 20 mg via INTRAVENOUS

## 2017-10-19 MED ORDER — DOCUSATE SODIUM 100 MG PO CAPS
100.0000 mg | ORAL_CAPSULE | Freq: Two times a day (BID) | ORAL | Status: DC
  Administered 2017-10-19 – 2017-10-20 (×2): 100 mg via ORAL
  Filled 2017-10-19 (×2): qty 1

## 2017-10-19 MED ORDER — SODIUM CHLORIDE 0.9 % IV SOLN: 250.0000 mL | INTRAVENOUS | Status: DC

## 2017-10-19 MED ORDER — ONDANSETRON HCL 4 MG/2ML IJ SOLN
INTRAMUSCULAR | Status: DC | PRN
  Administered 2017-10-19: 4 mg via INTRAVENOUS

## 2017-10-19 MED ORDER — CEFAZOLIN SODIUM-DEXTROSE 2-4 GM/100ML-% IV SOLN
2.0000 g | INTRAVENOUS | Status: AC
  Administered 2017-10-19: 2 g via INTRAVENOUS
  Filled 2017-10-19: qty 100

## 2017-10-19 MED ORDER — PHENYLEPHRINE 40 MCG/ML (10ML) SYRINGE FOR IV PUSH (FOR BLOOD PRESSURE SUPPORT)
PREFILLED_SYRINGE | INTRAVENOUS | Status: AC
  Filled 2017-10-19: qty 10

## 2017-10-19 MED ORDER — GABAPENTIN 100 MG PO CAPS
100.0000 mg | ORAL_CAPSULE | Freq: Two times a day (BID) | ORAL | Status: DC
  Administered 2017-10-19 – 2017-10-20 (×2): 100 mg via ORAL
  Filled 2017-10-19 (×2): qty 1

## 2017-10-19 MED ORDER — LIDOCAINE-EPINEPHRINE 1 %-1:100000 IJ SOLN
INTRAMUSCULAR | Status: AC
  Filled 2017-10-19: qty 1

## 2017-10-19 MED ORDER — METHYLPREDNISOLONE ACETATE 80 MG/ML IJ SUSP
INTRAMUSCULAR | Status: DC | PRN
  Administered 2017-10-19: 80 mg

## 2017-10-19 MED ORDER — FENTANYL CITRATE (PF) 250 MCG/5ML IJ SOLN
INTRAMUSCULAR | Status: DC | PRN
  Administered 2017-10-19 (×3): 50 ug via INTRAVENOUS

## 2017-10-19 MED ORDER — OXYCODONE HCL 5 MG PO TABS
ORAL_TABLET | ORAL | Status: AC
  Filled 2017-10-19: qty 1

## 2017-10-19 MED ORDER — LIDOCAINE 2% (20 MG/ML) 5 ML SYRINGE
INTRAMUSCULAR | Status: AC
  Filled 2017-10-19: qty 10

## 2017-10-19 MED ORDER — METOCLOPRAMIDE HCL 5 MG/ML IJ SOLN: 10.0000 mg | Freq: Once | INTRAMUSCULAR | Status: DC | PRN

## 2017-10-19 SURGICAL SUPPLY — 64 items
ADH SKN CLS APL DERMABOND .7 (GAUZE/BANDAGES/DRESSINGS) ×1
APL SKNCLS STERI-STRIP NONHPOA (GAUZE/BANDAGES/DRESSINGS)
BAG DECANTER FOR FLEXI CONT (MISCELLANEOUS) ×2 IMPLANT
BENZOIN TINCTURE PRP APPL 2/3 (GAUZE/BANDAGES/DRESSINGS) IMPLANT
BLADE CLIPPER SURG (BLADE) IMPLANT
BLADE SURG 11 STRL SS (BLADE) ×2 IMPLANT
BUR MATCHSTICK NEURO 3.0 LAGG (BURR) ×2 IMPLANT
BUR PRECISION FLUTE 5.0 (BURR) IMPLANT
CANISTER SUCT 3000ML PPV (MISCELLANEOUS) ×2 IMPLANT
CARTRIDGE OIL MAESTRO DRILL (MISCELLANEOUS) ×1 IMPLANT
DECANTER SPIKE VIAL GLASS SM (MISCELLANEOUS) ×3 IMPLANT
DERMABOND ADVANCED (GAUZE/BANDAGES/DRESSINGS) ×1
DERMABOND ADVANCED .7 DNX12 (GAUZE/BANDAGES/DRESSINGS) ×1 IMPLANT
DIFFUSER DRILL AIR PNEUMATIC (MISCELLANEOUS) ×2 IMPLANT
DRAPE LAPAROTOMY 100X72X124 (DRAPES) ×2 IMPLANT
DRAPE MICROSCOPE LEICA (MISCELLANEOUS) ×2 IMPLANT
DRAPE SURG 17X23 STRL (DRAPES) ×2 IMPLANT
DRSG OPSITE POSTOP 3X4 (GAUZE/BANDAGES/DRESSINGS) ×2 IMPLANT
DURAPREP 26ML APPLICATOR (WOUND CARE) ×2 IMPLANT
ELECT REM PT RETURN 9FT ADLT (ELECTROSURGICAL) ×2
ELECTRODE REM PT RTRN 9FT ADLT (ELECTROSURGICAL) ×1 IMPLANT
FLOSEAL 5ML (HEMOSTASIS) ×2 IMPLANT
GAUZE SPONGE 4X4 12PLY STRL (GAUZE/BANDAGES/DRESSINGS) IMPLANT
GAUZE SPONGE 4X4 16PLY XRAY LF (GAUZE/BANDAGES/DRESSINGS) IMPLANT
GLOVE BIO SURGEON STRL SZ7 (GLOVE) IMPLANT
GLOVE BIOGEL PI IND STRL 7.0 (GLOVE) ×1 IMPLANT
GLOVE BIOGEL PI IND STRL 7.5 (GLOVE) ×2 IMPLANT
GLOVE BIOGEL PI INDICATOR 7.0 (GLOVE) ×1
GLOVE BIOGEL PI INDICATOR 7.5 (GLOVE) ×2
GLOVE ECLIPSE 6.5 STRL STRAW (GLOVE) ×1 IMPLANT
GLOVE ECLIPSE 7.0 STRL STRAW (GLOVE) ×2 IMPLANT
GLOVE EXAM NITRILE LRG STRL (GLOVE) IMPLANT
GLOVE EXAM NITRILE XL STR (GLOVE) IMPLANT
GLOVE EXAM NITRILE XS STR PU (GLOVE) IMPLANT
GLOVE SURG SS PI 7.0 STRL IVOR (GLOVE) ×6 IMPLANT
GOWN STRL REUS W/ TWL LRG LVL3 (GOWN DISPOSABLE) ×4 IMPLANT
GOWN STRL REUS W/ TWL XL LVL3 (GOWN DISPOSABLE) IMPLANT
GOWN STRL REUS W/TWL 2XL LVL3 (GOWN DISPOSABLE) IMPLANT
GOWN STRL REUS W/TWL LRG LVL3 (GOWN DISPOSABLE) ×8
GOWN STRL REUS W/TWL XL LVL3 (GOWN DISPOSABLE)
HEMOSTAT POWDER KIT SURGIFOAM (HEMOSTASIS) IMPLANT
KIT BASIN OR (CUSTOM PROCEDURE TRAY) ×2 IMPLANT
KIT TURNOVER KIT B (KITS) ×2 IMPLANT
NDL HYPO 18GX1.5 BLUNT FILL (NEEDLE) IMPLANT
NDL SPNL 18GX3.5 QUINCKE PK (NEEDLE) IMPLANT
NEEDLE HYPO 18GX1.5 BLUNT FILL (NEEDLE) ×2 IMPLANT
NEEDLE HYPO 22GX1.5 SAFETY (NEEDLE) ×2 IMPLANT
NEEDLE SPNL 18GX3.5 QUINCKE PK (NEEDLE) ×2 IMPLANT
NS IRRIG 1000ML POUR BTL (IV SOLUTION) ×2 IMPLANT
OIL CARTRIDGE MAESTRO DRILL (MISCELLANEOUS) ×2
PACK LAMINECTOMY NEURO (CUSTOM PROCEDURE TRAY) ×2 IMPLANT
PAD ARMBOARD 7.5X6 YLW CONV (MISCELLANEOUS) ×6 IMPLANT
RUBBERBAND STERILE (MISCELLANEOUS) ×4 IMPLANT
SPONGE LAP 4X18 RFD (DISPOSABLE) IMPLANT
SPONGE SURGIFOAM ABS GEL SZ50 (HEMOSTASIS) ×2 IMPLANT
STRIP CLOSURE SKIN 1/2X4 (GAUZE/BANDAGES/DRESSINGS) IMPLANT
SUT VIC AB 0 CT1 18XCR BRD8 (SUTURE) ×1 IMPLANT
SUT VIC AB 0 CT1 8-18 (SUTURE) ×2
SUT VIC AB 2-0 CT1 18 (SUTURE) IMPLANT
SUT VICRYL 3-0 RB1 18 ABS (SUTURE) ×4 IMPLANT
SYR 3ML LL SCALE MARK (SYRINGE) ×1 IMPLANT
TOWEL GREEN STERILE (TOWEL DISPOSABLE) ×2 IMPLANT
TOWEL GREEN STERILE FF (TOWEL DISPOSABLE) ×2 IMPLANT
WATER STERILE IRR 1000ML POUR (IV SOLUTION) ×2 IMPLANT

## 2017-10-19 NOTE — Anesthesia Postprocedure Evaluation (Signed)
Anesthesia Post Note  Patient: Jon Anderson  Procedure(s) Performed: MICRODISCECTOMY LUMBAR FIVE- SACRAL 1 ONE  (Right Back)     Patient location during evaluation: PACU Anesthesia Type: General Level of consciousness: awake and alert Pain management: pain level controlled Vital Signs Assessment: post-procedure vital signs reviewed and stable Respiratory status: spontaneous breathing, nonlabored ventilation, respiratory function stable and patient connected to nasal cannula oxygen Cardiovascular status: blood pressure returned to baseline and stable Postop Assessment: no apparent nausea or vomiting Anesthetic complications: no    Last Vitals:  Vitals:   10/19/17 1530 10/19/17 1554  BP: 138/83 (!) 143/92  Pulse: 74 83  Resp: 13 17  Temp: 36.5 C 36.7 C  SpO2: 98% 100%    Last Pain:  Vitals:   10/19/17 1554  TempSrc: Oral  PainSc:                  [name]

## 2017-10-19 NOTE — Op Note (Signed)
NEUROSURGERY OPERATIVE NOTE   PREOP DIAGNOSIS: Lumbar disc herniation, L5-S1  POSTOP DIAGNOSIS: Same  PROCEDURE: 1. Redo right L5-S1 laminotomy with medial facetectomy, and microdiscectomy for decompression of nerve root 2. Use of operating microscope  SURGEON: Dr. Consuella Lose, MD  ASSISTANT: Dr. Ashok Pall, MD  ANESTHESIA: General Endotracheal  EBL: 100cc  SPECIMENS: None  DRAINS: None  COMPLICATIONS: none immediate  CONDITION: Hemodynamically stable to PACU  HISTORY: Jon Anderson is a 67 y.o. male with a history of right sided L5-S1 disc herniation.  He underwent initial microdiscectomy about 4 months ago with initial good resolution of pain, however he had recurrence of severe right-sided leg pain.  Repeat MRI demonstrated recurrent right-sided L5-S1 disc herniation.  He therefore elected to proceed with repeat surgical decompression.  Risks and benefits of the surgery explained in detail to the patient and his family.  After all questions were answered informed consent was obtained and witnessed.  PROCEDURE IN DETAIL: The patient was brought to the operating room. After induction of general anesthesia, the patient was positioned on the operative table in the prone position with all pressure points meticulously padded. The skin of the low back was then prepped and draped in the usual sterile fashion.  After timeout was conducted, the previous skin incision was infiltrated with local anesthetic with epinephrine.  If the incision was then made sharply and Bovie electrocautery was used to carry the incision down through the subcutaneous tissue until the lumbodorsal fascia was identified and incised on the right side.  Spinous process at L5 and S1 was identified, and subperiosteal dissection was carried out.  The laminar defect at L5 was identified.  A self-retaining retractor was placed over the right L5-S1 facet complex.  Intraoperative x-ray was taken to confirm  location at the correct level.  Bovie was used to fully identify the edges of the previous laminotomy.  More of the medial portion of the facet complex and the lamina superior to the defect at L5 was identified.  High-speed drill was then used to remove more of the medial portion of the right L5-S1 facet complex, and the superior portion of the right L5 lamina.  Normal dura was identified.  Kerrison Stann [place] were then used to complete the medial facetectomy.  There was a significant amount of scar tissue adhering the dura to the ventral epidural space.  I did identify the right L5 pedicle, and the exiting right L5 nerve root.  I was able to place a ball-tipped dissector in the ventral epidural space.  I then used a Kerrison rondure to remove some of the scar tissue.  #11 blade scalpel was then used to incise the posterior annulus.  Using a combination of curettes and Rogers, I did remove multiple relatively large disc fragments from the ventral epidural space.  After discectomy, I was able to easily pass a long ball-tipped dissector underneath the right L5 nerve root out the foramen, as well as into the ventral epidural space, and underneath the right S1 nerve root out the S1 foramen.  This indicated good decompression.  Hemostasis was then secured using a combination of morcellized Gelfoam and thrombin and bipolar electrocautery. The wound is irrigated with copious amounts of antibiotic saline irrigation. The nerve root was then covered with a long-acting steroid solution. Self-retaining retractor was then removed, and the wound is closed in layers using a combination of interrupted 0 Vicryl and 3-0 Vicryl stitches. The skin was closed using standard skin glue.  At the  end of the case all sponge, needle, and instrument counts were correct. The patient was then transferred to the stretcher and taken to the postanesthesia care unit in stable hemodynamic condition.

## 2017-10-19 NOTE — Anesthesia Procedure Notes (Signed)
Procedure Name: Intubation Date/Time: 10/19/2017 12:42 PM Performed by: Wilburn Cornelia, CRNA Pre-anesthesia Checklist: Patient identified, Emergency Drugs available, Suction available, Patient being monitored and Timeout performed Patient Re-evaluated:Patient Re-evaluated prior to induction Oxygen Delivery Method: Circle system utilized Preoxygenation: Pre-oxygenation with 100% oxygen Induction Type: IV induction Ventilation: Mask ventilation without difficulty and Oral airway inserted - appropriate to patient size Laryngoscope Size: Mac and 4 Grade View: Grade I Tube type: Oral Tube size: 7.5 mm Number of attempts: 1 Airway Equipment and Method: Stylet Placement Confirmation: ETT inserted through vocal cords under direct vision,  positive ETCO2,  CO2 detector and breath sounds checked- equal and bilateral Secured at: 22 cm Tube secured with: Tape Dental Injury: Teeth and Oropharynx as per pre-operative assessment

## 2017-10-19 NOTE — Anesthesia Preprocedure Evaluation (Signed)
Anesthesia Evaluation  Patient identified by MRN, date of birth, ID band Patient awake    Reviewed: Allergy & Precautions, NPO status , Patient's Chart, lab work & pertinent test results  Airway Mallampati: I  TM Distance: >3 FB Neck ROM: Full    Dental no notable dental hx.    Pulmonary sleep apnea , former smoker,    Pulmonary exam normal breath sounds clear to auscultation       Cardiovascular hypertension, Pt. on medications + CAD  Normal cardiovascular exam Rhythm:Regular Rate:Normal     Neuro/Psych negative neurological ROS  negative psych ROS   GI/Hepatic Neg liver ROS, GERD  Medicated and Controlled,  Endo/Other  diabetes, Type 2, Insulin Dependent  Renal/GU ESRF and DialysisRenal disease  negative genitourinary   Musculoskeletal negative musculoskeletal ROS (+)   Abdominal   Peds negative pediatric ROS (+)  Hematology negative hematology ROS (+)   Anesthesia Other Findings   Reproductive/Obstetrics negative OB ROS                             Anesthesia Physical  Anesthesia Plan  ASA: III  Anesthesia Plan: General   Post-op Pain Management:    Induction: Intravenous  PONV Risk Score and Plan: 2 and Ondansetron, Treatment may vary due to age or medical condition and Midazolam  Airway Management Planned: Oral ETT  Additional Equipment:   Intra-op Plan:   Post-operative Plan: Extubation in OR  Informed Consent: I have reviewed the patients History and Physical, chart, labs and discussed the procedure including the risks, benefits and alternatives for the proposed anesthesia with the patient or authorized representative who has indicated his/her understanding and acceptance.     Plan Discussed with: CRNA and Surgeon  Anesthesia Plan Comments:         Anesthesia Quick Evaluation

## 2017-10-19 NOTE — Transfer of Care (Signed)
Immediate Anesthesia Transfer of Care Note  Patient: NICHOLAI WILLETTE  Procedure(s) Performed: MICRODISCECTOMY LUMBAR FIVE- SACRAL 1 ONE  (Right Back)  Patient Location: PACU  Anesthesia Type:General  Level of Consciousness: awake, alert  and oriented  Airway & Oxygen Therapy: Patient Spontanous Breathing and Patient connected to nasal cannula oxygen  Post-op Assessment: Report given to RN and Post -op Vital signs reviewed and stable  Post vital signs: Reviewed and stable  Last Vitals:  Vitals Value Taken Time  BP    Temp    Pulse    Resp 11 10/19/2017  2:32 PM  SpO2    Vitals shown include unvalidated device data.  Last Pain:  Vitals:   10/19/17 1148  TempSrc:   PainSc: 0-No pain         Complications: No apparent anesthesia complications

## 2017-10-20 ENCOUNTER — Encounter (HOSPITAL_COMMUNITY): Payer: Self-pay | Admitting: Neurosurgery

## 2017-10-20 DIAGNOSIS — M5127 Other intervertebral disc displacement, lumbosacral region: Secondary | ICD-10-CM | POA: Diagnosis not present

## 2017-10-20 LAB — GLUCOSE, CAPILLARY: Glucose-Capillary: 137 mg/dL — ABNORMAL HIGH (ref 70–99)

## 2017-10-20 MED ORDER — OXYCODONE HCL 5 MG PO TABS: 5.0000 mg | ORAL_TABLET | ORAL | 0 refills | Status: DC | PRN

## 2017-10-20 MED ORDER — CYCLOBENZAPRINE HCL 10 MG PO TABS: 10.0000 mg | ORAL_TABLET | Freq: Three times a day (TID) | ORAL | 3 refills | Status: DC | PRN

## 2017-10-20 NOTE — Evaluation (Signed)
Occupational Therapy Evaluation Patient Details Name: Jon Anderson MRN: [id number] DOB: December 17, 1950 Today's Date: 10/20/2017    History of Present Illness Pt is a 67 y/o male s/p redo right L5-S1 laminotomy with medial facetectomy, and microdiscectomy for decompression of nerve root due to recurrent R LE pain (Initally underwent microdisectomy 06/2017).  PMH significant for but not limited to: anemia, CAD, GERD, HTN, microdisectormy, DM.    Clinical Impression   PTA patient used cane for mobility and ADLs, limited IADLS, reports required increased time due to pain.  Patient currently requires supervision for UB/LB ADLs, toileting, grooming and transfers (toilet, simulated tub) using RW.  He has been educated on precautions, safety, compensatory techniques for increased participation in ADLs, and DME recommendations. He will have support of family at home 24/7, and demonstrates good recall of techniques taught.  No further OT services required to return home.  Patient agreeable to recommendations, plans to use 3:1 over toilet and complete bathing seated in shower.  Thank you for this referral.  OT signing off.      Follow Up Recommendations  No OT follow up;Supervision - Intermittent    Equipment Recommendations  None recommended by OT    Recommendations for Other Services       Precautions / Restrictions Precautions Precautions: Back Precaution Booklet Issued: Yes (comment) Precaution Comments: reviewed precautions and handout  Restrictions Weight Bearing Restrictions: No      Mobility Bed Mobility Overal bed mobility: Needs Assistance Bed Mobility: Rolling;Sidelying to Sit;Sit to Sidelying Rolling: Supervision Sidelying to sit: Supervision     Sit to sidelying: Supervision General bed mobility comments: supervision to ensure log rolling technique  Transfers Overall transfer level: Needs assistance Equipment used: Rolling walker (2 wheeled) Transfers: Sit to/from  Stand Sit to Stand: Supervision         General transfer comment: supervision for safety     Balance Overall balance assessment: No apparent balance deficits (not formally assessed)                                         ADL either performed or assessed with clinical judgement   ADL Overall ADL's : Needs assistance/impaired Eating/Feeding: Modified independent;Sitting   Grooming: Supervision/safety;Cueing for compensatory techniques;Standing Grooming Details (indicate cue type and reason): education on techniques for adherance to back precautions, encouarged to have seat nearby for safety as patient reports 'I can't stand for long"  Upper Body Bathing: Supervision/ safety;Set up;Sitting   Lower Body Bathing: Supervison/ safety;Set up;Sit to/from stand;Cueing for back precautions;Cueing for compensatory techniques Lower Body Bathing Details (indicate cue type and reason): reviewed techniques for adherance to precautions, completing seated using figure 4 technique  Upper Body Dressing : Set up;Sitting   Lower Body Dressing: Supervision/safety;Sit to/from stand;Cueing for back precautions;Cueing for compensatory techniques Lower Body Dressing Details (indicate cue type and reason): using figure 4 technique for LB clothing mgmt, no discomfort  Toilet Transfer: Supervision/safety;Ambulation;Regular Glass blower/designer Details (indicate cue type and reason): cueing for transfer technique for back precautions, edu on use of 3:1 at home for ease temporarily Toileting- Clothing Manipulation and Hygiene: Supervision/safety;Sit to/from stand;Cueing for compensatory techniques Toileting - Clothing Manipulation Details (indicate cue type and reason): edu on compensatory techniques for back precautions Tub/ Shower Transfer: Supervision/safety;Shower seat;Rolling walker;Adhering to back precautions;Ambulation Tub/Shower Transfer Details (indicate cue type and reason): backwards  step to simulate tub transfer with rolling walker,  supervision of safety and technique Functional mobility during ADLs: Rolling walker;Supervision/safety(in room only) General ADL Comments: good adherance to back precautions after education on compensatory techniques      Vision Baseline Vision/History: Wears glasses Wears Glasses: Reading only Patient Visual Report: No change from baseline Vision Assessment?: No apparent visual deficits     Perception     Praxis      Pertinent Vitals/Pain Pain Assessment: Faces Faces Pain Scale: Hurts little more Pain Location: back Pain Descriptors / Indicators: Discomfort;Operative site guarding Pain Intervention(s): Limited activity within patient's tolerance;Monitored during session;Repositioned     Hand Dominance Right   Extremity/Trunk Assessment Upper Extremity Assessment Upper Extremity Assessment: Overall WFL for tasks assessed   Lower Extremity Assessment Lower Extremity Assessment: Defer to PT evaluation(s/p lumbar surgery )   Cervical / Trunk Assessment Cervical / Trunk Assessment: Other exceptions Cervical / Trunk Exceptions: s/p lumbar surgery    Communication Communication Communication: No difficulties   Cognition Arousal/Alertness: Awake/alert Behavior During Therapy: WFL for tasks assessed/performed Overall Cognitive Status: Within Functional Limits for tasks assessed                                     General Comments       Exercises     Shoulder Instructions      Home Living Family/patient expects to be discharged to:: Private residence Living Arrangements: Spouse/significant other;Children Available Help at Discharge: Family;Available 24 hours/day Type of Home: House Home Access: Stairs to enter [employer] of Steps: 2   Home Layout: One level     Bathroom Shower/Tub: Teacher, early years/pre: Standard     Home Equipment: Environmental consultant - 2 wheels;Cane - single  point;Bedside commode;Shower seat          Prior Functioning/Environment Level of Independence: Independent with assistive device(s)        Comments: reports using cane for mobility, ADLs and limited IADLs         OT Problem List: Decreased activity tolerance;Decreased knowledge of precautions;Pain      OT Treatment/Interventions:      OT Goals(Current goals can be found in the care plan section) Acute Rehab OT Goals Patient Stated Goal: home today OT Goal Formulation: With patient  OT Frequency:     Barriers to D/C:            Co-evaluation              AM-PAC PT "6 Clicks" Daily Activity     Outcome Measure Help from another person eating meals?: None Help from another person taking care of personal grooming?: A Little Help from another person toileting, which includes using toliet, bedpan, or urinal?: A Little Help from another person bathing (including washing, rinsing, drying)?: A Little Help from another person to put on and taking off regular upper body clothing?: None Help from another person to put on and taking off regular lower body clothing?: A Little 6 Click Score: 20   End of Session Equipment Utilized During Treatment: Gait belt;Rolling walker Nurse Communication: Mobility status  Activity Tolerance: Patient tolerated treatment well Patient left: in bed;with call bell/phone within reach  OT Visit Diagnosis: Other abnormalities of gait and mobility (R26.89);Pain Pain - part of body: (back)                Time: 0263-7858 OT Time Calculation (min): 24 min Charges:  OT General Charges $OT  Visit: 1 Visit OT Evaluation $OT Eval Low Complexity: 1 Low OT Treatments $Self Care/Home Management : 8-22 mins G-Codes:     Delight Stare, OTR/L  Pager [phone]   Delight Stare 10/20/2017, 8:33 AM

## 2017-10-20 NOTE — Progress Notes (Signed)
Patient alert and oriented, mae's well, voiding adequate amount of urine, swallowing without difficulty,  c/o mild pain at time of discharge. Patient discharged home with family. Script and discharged instructions given to patient. Patient and family stated understanding of instructions given. Patient has an appointment with Dr. Nundkumar.    

## 2017-10-20 NOTE — Discharge Instructions (Signed)
Wound Care Leave incision open to air. You may shower. Do not scrub directly on incision.  Do not put any creams, lotions, or ointments on incision. Activity Walk each and every day, increasing distance each day. No lifting greater than 5 lbs.  Avoid bending, arching, and twisting. No driving for 2 weeks; may ride as a passenger locally.  Diet Resume your normal diet.  Return to Work Will be discussed at you follow up appointment. Call Your Doctor If Any of These Occur Redness, drainage, or swelling at the wound.  Temperature greater than 101 degrees. Severe pain not relieved by pain medication. Incision starts to come apart. Follow Up Appt Call today for appointment in 3 weeks ([phone]) or for problems.

## 2017-10-20 NOTE — Evaluation (Signed)
Physical Therapy Evaluation Patient Details Name: Jon Anderson MRN: [id number] DOB: [birth year] Today's Date: 10/20/2017   History of Present Illness  Pt is a 67 y/o male s/p redo right L5-S1 laminotomy with medial facetectomy, and microdiscectomy for decompression of nerve root due to recurrent R LE pain (Initally underwent microdisectomy 06/2017).  PMH significant for but not limited to: anemia, CAD, GERD, HTN, microdisectormy, DM.   Clinical Impression  Patient seen for mobility assessment s/p spinal surgery. Mobilizing well. Educated patient on precautions, mobility expectations, safety and car transfers. No further acute PT needs. Will sign off.     Follow Up Recommendations No PT follow up    Equipment Recommendations  None recommended by PT    Recommendations for Other Services       Precautions / Restrictions Precautions Precautions: Back Precaution Booklet Issued: Yes (comment) Precaution Comments: reviewed precautions and handout  Restrictions Weight Bearing Restrictions: No      Mobility  Bed Mobility Overal bed mobility: Needs Assistance Bed Mobility: Rolling;Sidelying to Sit;Sit to Sidelying Rolling: Supervision Sidelying to sit: Supervision     Sit to sidelying: Supervision General bed mobility comments: Supervision for cues, increased time and effort to perform. No physical assist required  Transfers Overall transfer level: Needs assistance Equipment used: Rolling walker (2 wheeled) Transfers: Sit to/from Stand Sit to Stand: Supervision         General transfer comment: supervision for safety   Ambulation/Gait Ambulation/Gait assistance: Supervision Gait Distance (Feet): 210 Feet Assistive device: Rolling walker (2 wheeled) Gait Pattern/deviations: Step-through pattern;Decreased stride length;Narrow base of support Gait velocity: decreased   General Gait Details: slow and guarded with gait, no physical assist required. VCs for increased  cadence  Stairs            Wheelchair Mobility    Modified Rankin (Stroke Patients Only)       Balance Overall balance assessment: Mild deficits observed, not formally tested                                           Pertinent Vitals/Pain Pain Assessment: Faces Faces Pain Scale: Hurts little more Pain Location: back Pain Descriptors / Indicators: Discomfort;Operative site guarding Pain Intervention(s): Limited activity within patient's tolerance;Monitored during session;Repositioned    Home Living Family/patient expects to be discharged to:: Private residence Living Arrangements: Spouse/significant other;Children Available Help at Discharge: Family;Available 24 hours/day Type of Home: House Home Access: Stairs to enter   [employer] of Steps: 2 Home Layout: One level Home Equipment: Walker - 2 wheels;Cane - single point;Bedside commode;Shower seat      Prior Function Level of Independence: Independent with assistive device(s)         Comments: reports using cane for mobility, ADLs and limited IADLs      Hand Dominance   Dominant Hand: Right    Extremity/Trunk Assessment   Upper Extremity Assessment Upper Extremity Assessment: Overall WFL for tasks assessed    Lower Extremity Assessment Lower Extremity Assessment: Generalized weakness    Cervical / Trunk Assessment Cervical / Trunk Assessment: Other exceptions Cervical / Trunk Exceptions: s/p lumbar surgery   Communication   Communication: No difficulties  Cognition Arousal/Alertness: Awake/alert Behavior During Therapy: WFL for tasks assessed/performed Overall Cognitive Status: Within Functional Limits for tasks assessed  General Comments      Exercises     Assessment/Plan    PT Assessment Patent does not need any further PT services  PT Problem List         PT Treatment Interventions      PT  Goals (Current goals can be found in the Care Plan section)  Acute Rehab PT Goals Patient Stated Goal: home today PT Goal Formulation: All assessment and education complete, DC therapy    Frequency     Barriers to discharge        Co-evaluation               AM-PAC PT "6 Clicks" Daily Activity  Outcome Measure Difficulty turning over in bed (including adjusting bedclothes, sheets and blankets)?: A Little Difficulty moving from lying on back to sitting on the side of the bed? : A Little Difficulty sitting down on and standing up from a chair with arms (e.g., wheelchair, bedside commode, etc,.)?: A Little Help needed moving to and from a bed to chair (including a wheelchair)?: A Little Help needed walking in hospital room?: A Little Help needed climbing 3-5 steps with a railing? : A Little 6 Click Score: 18    End of Session   Activity Tolerance: Patient tolerated treatment well Patient left: in bed;with call bell/phone within reach Nurse Communication: Mobility status PT Visit Diagnosis: Difficulty in walking, not elsewhere classified (R26.2)    Time: 9774-1423 PT Time Calculation (min) (ACUTE ONLY): 22 min   Charges:   PT Evaluation $PT Eval Low Complexity: 1 Low     PT G Codes:        Alben Deeds, PT DPT  Board Certified Neurologic Specialist [phone]   Duncan Dull 10/20/2017, 8:47 AM

## 2017-10-20 NOTE — Discharge Summary (Signed)
Physician Discharge Summary  Patient ID: Jon Anderson MRN: [id number] DOB/AGE: 1950-04-19 67 y.o.  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 rate 18, height 5\' 11"  (1.803 m), weight 74.4 kg (164 lb 1.6 oz), SpO2 93 %. Incision is clean and dry Station and gait are intact  Disposition: Discharge disposition: 01-Home or Self Care       Discharge Instructions    Call MD for:  redness, tenderness, or signs of infection (pain, swelling, redness, odor or green/yellow discharge around incision site)   Complete by:  As directed    Call MD for:  severe uncontrolled pain   Complete by:  As directed    Call MD for:  temperature >100.4   Complete by:  As directed    Diet - low sodium heart healthy   Complete by:  As directed    Increase activity slowly   Complete by:  As directed      Allergies as of 10/20/2017      Reactions   Kiwi Extract Swelling, Other (See Comments)   Facial swelling    Tape Other (See Comments)   Plastic tape-blistering      Medication List    TAKE these medications   acetaminophen 500 MG tablet Commonly known as:  TYLENOL Take 1,000 mg by mouth 2 (two) times daily as needed for moderate pain or headache.   aspirin EC 81 MG tablet Take 1 tablet (81 mg total) by mouth daily.   atorvastatin 40 MG tablet Commonly known  as:  LIPITOR Take 1 tablet (40 mg total) by mouth daily at 6 PM.   calcium acetate 667 MG capsule Commonly known as:  PHOSLO Take 3 capsules (2,001 mg total) by mouth 3 (three) times daily with meals. What changed:    how much to take  when to take this   clopidogrel 75 MG tablet Commonly known as:  PLAVIX Take 1 tablet (75 mg total) by mouth daily with breakfast.   cyclobenzaprine 10 MG tablet Commonly known as:  FLEXERIL Take 10 mg by mouth 3 (three) times daily. What changed:  Another medication with the same name was added. Make sure you understand how and when to take each.   cyclobenzaprine 10 MG tablet Commonly known as:  FLEXERIL Take 1 tablet (10 mg total) by mouth 3 (three) times daily as needed for muscle spasms. What changed:  You were already taking a medication with the same name, and this prescription was added. Make sure you understand how and when to take each.   diclofenac 75 MG EC tablet Commonly known as:  VOLTAREN Take 75 mg by mouth 2 (two) times daily.   gabapentin 100 MG capsule Commonly known as:  NEURONTIN Take 100 mg by mouth 2 (two) times daily.   glycerin adult 2 g suppository Place 1 suppository rectally as needed for constipation.   methocarbamol 500 MG tablet Commonly known as:  ROBAXIN Take 1 tablet (500 mg total) by mouth every 6 (six) hours  as needed for muscle spasms.   metoprolol succinate 50 MG 24 hr tablet Commonly known as:  TOPROL-XL Take 1 tablet by mouth one hour prior to CTA.   multivitamin Tabs tablet Take 1 tablet by mouth at bedtime.   nitroGLYCERIN 0.4 MG SL tablet Commonly known as:  NITROSTAT Place 1 tablet (0.4 mg total) under the tongue every 5 (five) minutes as needed for chest pain.   oxyCODONE 5 MG immediate release tablet Commonly known as:  Oxy IR/ROXICODONE Take 1 tablet (5 mg total) by mouth every 4 (four) hours as needed for moderate pain or severe pain ((score 4 to 6)). What changed:    medication  strength  how much to take  when to take this  reasons to take this   pantoprazole 40 MG tablet Commonly known as:  PROTONIX Take 40 mg by mouth daily.   polyethylene glycol packet Commonly known as:  MIRALAX / GLYCOLAX Take 17 g by mouth daily as needed for moderate constipation.   rOPINIRole 0.5 MG tablet Commonly known as:  REQUIP Take 0.5 mg by mouth 2 (two) times daily.        SignedEarleen Newport 10/20/2017, 8:57 AM

## 2017-10-22 NOTE — Progress Notes (Signed)
Fentanyl 75 mcq wasted in sink. Witnessed by [facility]

## 2017-11-29 ENCOUNTER — Encounter: Payer: Self-pay | Admitting: Podiatry

## 2017-11-29 ENCOUNTER — Telehealth: Payer: Self-pay | Admitting: *Deleted

## 2017-11-29 ENCOUNTER — Ambulatory Visit (INDEPENDENT_AMBULATORY_CARE_PROVIDER_SITE_OTHER): Payer: Medicare Other | Admitting: Podiatry

## 2017-11-29 DIAGNOSIS — M79676 Pain in unspecified toe(s): Secondary | ICD-10-CM

## 2017-11-29 DIAGNOSIS — Q828 Other specified congenital malformations of skin: Secondary | ICD-10-CM

## 2017-11-29 DIAGNOSIS — B351 Tinea unguium: Secondary | ICD-10-CM

## 2017-11-29 DIAGNOSIS — D689 Coagulation defect, unspecified: Secondary | ICD-10-CM

## 2017-11-29 DIAGNOSIS — E1142 Type 2 diabetes mellitus with diabetic polyneuropathy: Secondary | ICD-10-CM | POA: Diagnosis not present

## 2017-11-29 DIAGNOSIS — R0989 Other specified symptoms and signs involving the circulatory and respiratory systems: Secondary | ICD-10-CM

## 2017-11-29 NOTE — Progress Notes (Signed)
He presents today for follow-up of his toenails and calluses.  States he has been having trouble and pain in his legs ever since he has had his back surgery back in July he states that it still very very painful he has had a lot of pain on the plantar aspect of the foot as well.  He states that these fifth metatarsals are not doing well I have noticed the thickening of the callus.  Objective: Vital signs are stable he is alert and oriented x3.  Pulses are nonpalpable.  Neurologic sensorium seems to be hyper sensate.  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  Right greater than left.  The right one is exquisitely tender.  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  Assessment: Pain in limb secondary to peripheral vascular disease probably has some back pain associated with with it as well but superficial ulcerations have a ruptured which have never been there before sub-fifth bilateral.  At this point he did not appear to be clinically infected.  Plan: Debrided all reactive hyperkeratoses today debrided superficial ulcerations to bleeding.  Debrided toenails 1 through 5 bilateral.  I also placed padding and Silvadene cream and dressed a compressive dressing provided him with padding to place we are going to request ABIs to be performed and I will follow-up with him in a couple of months.  Should these lesions worsen on the plantar aspect of the foot he will notify us immediately.

## 2017-11-29 NOTE — Telephone Encounter (Signed)
Referral faxed to [facility] - main fax. Orders faxed to [facility] - Doppler lab.

## 2017-11-29 NOTE — Telephone Encounter (Signed)
-----   Message from Rip Harbour, [facility] sent at 11/29/2017 11:48 AM EDT ----- Regarding: Vascular Vascular referral - ABI's bilateral with vascular consult - nonpalpable pulses with wounds plantar bilateral

## 2017-12-03 ENCOUNTER — Other Ambulatory Visit: Payer: Self-pay | Admitting: Podiatry

## 2017-12-03 ENCOUNTER — Ambulatory Visit (HOSPITAL_COMMUNITY)
Admission: RE | Admit: 2017-12-03 | Payer: Medicare Other | Source: Ambulatory Visit | Attending: Podiatry | Admitting: Podiatry

## 2017-12-03 DIAGNOSIS — I739 Peripheral vascular disease, unspecified: Secondary | ICD-10-CM

## 2017-12-03 DIAGNOSIS — R0989 Other specified symptoms and signs involving the circulatory and respiratory systems: Secondary | ICD-10-CM

## 2017-12-11 ENCOUNTER — Ambulatory Visit (HOSPITAL_COMMUNITY)
Admission: RE | Admit: 2017-12-11 | Discharge: 2017-12-11 | Disposition: A | Discharge status: Home / Self Care | Payer: Medicare Other | Source: Ambulatory Visit | Attending: Cardiology | Admitting: Cardiology

## 2017-12-11 DIAGNOSIS — R0989 Other specified symptoms and signs involving the circulatory and respiratory systems: Secondary | ICD-10-CM | POA: Insufficient documentation

## 2017-12-11 DIAGNOSIS — I739 Peripheral vascular disease, unspecified: Secondary | ICD-10-CM | POA: Diagnosis not present

## 2017-12-12 ENCOUNTER — Telehealth: Payer: Self-pay | Admitting: *Deleted

## 2017-12-12 NOTE — Telephone Encounter (Signed)
I informed pt of Dr. Stephenie Acres review of results and recommendations.

## 2017-12-12 NOTE — Telephone Encounter (Signed)
-----   Message from Garrel Ridgel, [place] sent at 12/12/2017  7:24 AM EDT ----- PVD no treatment and recommend follow up with vascular in a year.

## 2017-12-18 ENCOUNTER — Ambulatory Visit: Payer: Medicare Other | Attending: Physician Assistant | Admitting: Physical Therapy

## 2017-12-18 ENCOUNTER — Encounter: Payer: Self-pay | Admitting: Physical Therapy

## 2017-12-18 ENCOUNTER — Other Ambulatory Visit: Payer: Self-pay

## 2017-12-18 DIAGNOSIS — G8929 Other chronic pain: Secondary | ICD-10-CM | POA: Diagnosis present

## 2017-12-18 DIAGNOSIS — M545 Low back pain: Secondary | ICD-10-CM | POA: Insufficient documentation

## 2017-12-18 DIAGNOSIS — M6281 Muscle weakness (generalized): Secondary | ICD-10-CM | POA: Diagnosis present

## 2017-12-18 DIAGNOSIS — M62830 Muscle spasm of back: Secondary | ICD-10-CM | POA: Insufficient documentation

## 2017-12-18 DIAGNOSIS — R293 Abnormal posture: Secondary | ICD-10-CM | POA: Diagnosis present

## 2017-12-18 NOTE — Therapy (Signed)
[place], [place], [address] Phone: [phone]   Fax:  [phone]  Physical Therapy Evaluation  Patient Details  Name: Jon Anderson MRN: [id number] Date of Birth: [birth year] Referring Provider: Dr Ferne Reus    Encounter Date: 12/18/2017  PT End of Session - 12/18/17 0946    Visit Number  1    Number of Visits  16    Date for PT Re-Evaluation  02/12/18    Authorization Type  medicare triad     PT Start Time  0927    PT Stop Time  1014    PT Time Calculation (min)  47 min    Activity Tolerance  Patient tolerated treatment well    Behavior During Therapy  [facility] for tasks assessed/performed       Past Medical History:  Diagnosis Date  . Anemia of chronic disease   . CAD (coronary artery disease)    a.  Myoview 4/11: EF 53%, no scar or ischemia   c. MV 2012 Nl perfusion, apical thinning.  No ischemia or scar.  EF 49%, appears greater by visual estimate.;  d.  Dob stress echo 12/13:  Negative Dob stress echo. There is no evidence of ischemia.  The LVF is normal. b. Normal cors 2016.  . Carotid stenosis    a. <45% RICA, >80% LICA by duplex 12/9831  . Chronic chest pain    occ  . ESRD (end stage renal disease) on dialysis ([place])    M-W-F  . GERD (gastroesophageal reflux disease)   . HNP (herniated nucleus pulposus), lumbar   . HTN (hypertension)    echo 3/10: EF 60%, LAE  . Hyperlipidemia   . Nephrolithiasis    "passed them all"  . Peripheral arterial disease ([place])    a. s/p PTCA to L SFA.  Marland Kitchen Pneumonia   . Restless legs   . Sleep apnea    no cpap, needs to reschedule appointment to set up aquiring cpap  . Snores    a. presumed OSA, pt has refused sleep eval in past.  . Type II diabetes mellitus ([place])    no longer on medications, checks blood glucose at home    Past Surgical History:  Procedure Laterality Date  . ANGIOPLASTY / STENTING FEMORAL Left 12/11/2013   dr berry  . AV  FISTULA PLACEMENT Left 03/19/2014   Procedure: CREATION OF ARTERIOVENOUS (AV) FISTULA  LEFT UPPER ARM;  Surgeon: Mal Misty, MD;  Location: [facility];  Service: Vascular;  Laterality: Left;  . CARDIAC CATHETERIZATION  2001 and 2010   . COLONOSCOPY W/ BIOPSIES AND POLYPECTOMY    . COLONOSCOPY WITH PROPOFOL N/A 08/01/2016   Procedure: COLONOSCOPY WITH PROPOFOL;  Surgeon: Carol Ada, MD;  Location: [facility];  Service: Endoscopy;  Laterality: N/A;  . ESOPHAGOGASTRODUODENOSCOPY (EGD) WITH PROPOFOL N/A 08/01/2016   Procedure: ESOPHAGOGASTRODUODENOSCOPY (EGD) WITH PROPOFOL;  Surgeon: Carol Ada, MD;  Location: [facility];  Service: Endoscopy;  Laterality: N/A;  . FOOT FRACTURE SURGERY Right   . FRACTURE SURGERY    . INGUINAL HERNIA REPAIR Left   . LEFT HEART CATH AND CORS/GRAFTS ANGIOGRAPHY N/A 04/27/2017   Procedure: LEFT HEART CATH AND CORS/GRAFTS ANGIOGRAPHY;  Surgeon: Leonie Man, MD;  Location: [place] CV LAB;  Service: Cardiovascular;  Laterality: N/A;  . LEFT HEART CATHETERIZATION WITH CORONARY ANGIOGRAM N/A 06/22/2014   Procedure: LEFT HEART CATHETERIZATION WITH CORONARY ANGIOGRAM;  Surgeon: Troy Sine, MD;  Location: [facility] CATH LAB;  Service: Cardiovascular;  Laterality: N/A;  . LOWER EXTREMITY ANGIOGRAM Left 12/11/2013   Procedure: LOWER EXTREMITY ANGIOGRAM;  Surgeon: Lorretta Harp, MD;  Location: [facility] CATH LAB;  Service: Cardiovascular;  Laterality: Left;  . LUMBAR LAMINECTOMY/DECOMPRESSION MICRODISCECTOMY Right 07/03/2017   Procedure: MICRODISCECTOMY LUMBAR FIVE - SACRAL ONE RIGHT;  Surgeon: Consuella Lose, MD;  Location: [place];  Service: Neurosurgery;  Laterality: Right;  . LUMBAR LAMINECTOMY/DECOMPRESSION MICRODISCECTOMY Right 10/19/2017   Procedure: MICRODISCECTOMY LUMBAR FIVE- SACRAL 1 ONE ;  Surgeon: Consuella Lose, MD;  Location: [place];  Service: Neurosurgery;  Laterality: Right;  . TONSILLECTOMY AND ADENOIDECTOMY      There were no vitals filed for this  visit.   Subjective Assessment - 12/18/17 0930    Subjective  Patient had back surgery in July. He had an inital back surgeyr in February. Since that point he has had some difficulty standing. When he stands for too long he has pain into his righ buttock and into his right leg.     Pertinent History  Patient has dialysis 3 days a week; bilateral peripheral neuropathy     How long can you walk comfortably?  < 10 minutes     Patient Stated Goals  to be able to stand longer     Currently in Pain?  Yes    Pain Score  5    7/10 at worst    Pain Location  Back    Pain Orientation  Right;Lower    Pain Descriptors / Indicators  Aching    Pain Type  Chronic pain    Pain Radiating Towards  into the right buttock     Pain Onset  More than a month ago    Pain Frequency  Constant    Aggravating Factors   standing     Pain Relieving Factors  hot pack ; oxycodone, tylenol     Effect of Pain on Daily Activities  difficulty standing for a long period of time          [facility] PT Assessment - 12/18/17 0001      Assessment   Medical Diagnosis  Low back pain following lumbar surgery     Referring Provider  Dr Ferne Reus     Onset Date/Surgical Date  11/19/17   last surgery    Hand Dominance  Right    Next MD Visit  Nothing scheduled at this time    Prior Therapy  None       Precautions   Precautions  None    Precaution Comments  be careful with lifting '      Restrictions   Weight Bearing Restrictions  No      Balance Screen   Has the patient fallen in the past 6 months  No    Has the patient had a decrease in activity level because of a fear of falling?   No    Is the patient reluctant to leave their home because of a fear of falling?   No      Home Environment   Additional Comments  No steps into his house.       Prior Function   Level of Independence  Independent    Vocation  Retired    Leisure  Nothing       Cognition   Overall Cognitive Status  Within Functional Limits  for tasks assessed    Attention  Focused    Focused Attention  Appears intact    Memory  Appears intact    Awareness  Appears intact    Problem Solving  Appears intact      Sensation   Additional Comments  Pain radiates into the right buttock       Coordination   Gross Motor Movements are Fluid and Coordinated  Yes    Fine Motor Movements are Fluid and Coordinated  Yes      Posture/Postural Control   Posture Comments  forward head; flat lordosis       ROM / Strength   AROM / PROM / Strength  AROM;PROM;Strength      AROM   AROM Assessment Site  Lumbar    Lumbar Flexion  limited 75% with pain     Lumbar Extension  can come to neutral with pain     Lumbar - Right Rotation  limited 50% with pain     Lumbar - Left Rotation  limited 50% with pain       PROM   PROM Assessment Site  Hip      Strength   Strength Assessment Site  Hip;Knee    Right/Left Hip  Right;Left    Right Hip Flexion  3+/5    Right Hip ABduction  4/5    Right Hip ADduction  4+/5    Left Hip Flexion  3+/5    Left Hip ABduction  4/5    Left Hip ADduction  4+/5    Right/Left Knee  Right;Left    Right Knee Extension  4+/5    Left Knee Flexion  4+/5    Left Knee Extension  4+/5      Palpation   Palpation comment  significant spasming bilateral lumbar parapsinals but increased tenderness to palpation on the right.       Ambulation/Gait   Gait Comments  decreased right single leg stance time. Patient uses the cane on the right side. He has a lateral shift onto the cane. He was strongly advised to use the cane in the left hand to shift weight off the leg.                 Objective measurements completed on examination: See above findings.      [facility] Adult PT Treatment/Exercise - 12/18/17 0001      Lumbar Exercises: Stretches   Active Hamstring Stretch Limitations  seated 3x20 sec hold       Lumbar Exercises: Supine   Other Supine Lumbar Exercises  supine march 2x10              PT  Education - 12/18/17 0946    Education Details  reviewed HEP and symptom management     Person(s) Educated  Patient    Methods  Explanation;Demonstration;Verbal cues;Handout;Tactile cues    Comprehension  Verbalized understanding;Returned demonstration;Verbal cues required;Tactile cues required       PT Short Term Goals - 12/18/17 1536      PT SHORT TERM GOAL #1   Title  pt will be I with inital HEP (06/06/2016)    Time  4    Period  Weeks    Status  New    Target Date  01/15/18      PT SHORT TERM GOAL #2   Title  Patient will increase right hip flexion strength to 4/5     Time  4    Period  Weeks    Status  New    Target Date  01/15/18      PT SHORT TERM GOAL #3  Title  Patient will increase lumbar flexion by 25%     Time  4    Period  Weeks    Status  New    Target Date  01/15/18        PT Long Term Goals - 12/18/17 1537      PT LONG TERM GOAL #1   Title  Patient will bend to pick item off the ground without significant pain in order to perfrom ADL's     Time  8    Period  Weeks    Status  New    Target Date  02/12/18      PT LONG TERM GOAL #2   Title  Patient will stand for 30 min without increased pain in order to perfrom ADL's     Time  8    Period  Weeks    Status  New    Target Date  02/12/18      PT LONG TERM GOAL #3   Title  Patient will improve FOTO score to 45% to demonstrate improved function     Time  8    Period  Weeks    Status  New    Target Date  02/12/18             Plan - 12/18/17 1349    Clinical Impression Statement  Patient is a 67 year old male with right sided lower back pain following lumbar microdisectomy on 7/12. He has increased difficulty standing for extended periods of time. He has weakness in his right leg. He was advised to use his cane on his left side to reduce stress on his right side. He has significant spasming on the right side in his parapsinals and upper glutes.      History and Personal Factors relevant to  plan of care:  peripheral neuropathy; DMII     Clinical Presentation  Evolving    Clinical Presentation due to:  Pain that increases with function     Clinical Decision Making  Moderate    Rehab Potential  Good    PT Frequency  2x / week    PT Duration  8 weeks    PT Treatment/Interventions  ADLs/Self Care Home Management;Cryotherapy;Electrical Stimulation;Iontophoresis 4mg /ml Dexamethasone;Ultrasound;DME Instruction;Therapeutic exercise;Therapeutic activities;Gait training;Neuromuscular re-education;Patient/family education;Manual techniques;Dry needling       Patient will benefit from skilled therapeutic intervention in order to improve the following deficits and impairments:  Abnormal gait, Pain, Decreased endurance, Decreased range of motion, Decreased strength, Decreased activity tolerance, Increased muscle spasms, Difficulty walking  Visit Diagnosis: Chronic right-sided low back pain without sciatica  Muscle spasm of back  Abnormal posture  Muscle weakness (generalized)     Problem List Patient Active Problem List   Diagnosis Date Noted  . HNP (herniated nucleus pulposus), lumbar 07/03/2017  . Chest pain in adult 04/27/2017  . Restless leg syndrome, uncontrolled 04/27/2017  . 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 Anemia of chronic disease 03/17/2017  . Severe uncontrolled hypertension 03/17/2017  . ESRD (end stage renal disease) on dialysis ([place]) 03/17/2017  . Hyperlipidemia 03/17/2017  . Chronic chest pain 03/17/2017  . Type II diabetes mellitus ([place]) 03/17/2017  . Acute on chronic diastolic heart failure ([place]) 03/17/2017  . Acute heart failure ([place]) 03/17/2017  . Lumbar radiculopathy 01/16/2017  . Chest pain, non-cardiac 10/09/2015  . Acute on chronic renal failure ([place]) 06/16/2014  . Renal failure (ARF), acute on chronic (HCC) 06/16/2014  . Shoulder pain, left 12/15/2013  .  Chest pain 12/15/2013  . Claudication ([place]) 12/11/2013  . PVD (peripheral vascular  disease) ([place]) 12/11/2013  . Peripheral arterial disease ([place]) 09/30/2013  . Carotid artery disease ([place]) 09/30/2013  . Acute chest pain 11/15/2012  . Left-sided chest wall pain 03/19/2012  . Bruit 09/15/2010  . CAD (coronary artery disease) nonobstructive per cath 2012   . Chronic kidney disease (CKD), stage IV (severe) ([place])   . Precordial chest pain 07/26/2009  . DM (diabetes mellitus) ([place]) 07/22/2009  . Hyperlipidemia LDL goal <70 07/22/2009  . Hypertension 07/22/2009    Carney Living PT DPT  12/18/2017, 3:41 PM  [facility] [address] [place], [place], [address] Phone: [phone]   Fax:  [phone]  Name: Jon Anderson MRN: [id number] Date of Birth: [birth year]

## 2017-12-19 ENCOUNTER — Ambulatory Visit (INDEPENDENT_AMBULATORY_CARE_PROVIDER_SITE_OTHER): Payer: Medicare Other | Admitting: Cardiovascular Disease

## 2017-12-19 ENCOUNTER — Encounter: Payer: Self-pay | Admitting: Cardiovascular Disease

## 2017-12-19 DIAGNOSIS — I6522 Occlusion and stenosis of left carotid artery: Secondary | ICD-10-CM | POA: Diagnosis not present

## 2017-12-19 DIAGNOSIS — I2510 Atherosclerotic heart disease of native coronary artery without angina pectoris: Secondary | ICD-10-CM | POA: Diagnosis not present

## 2017-12-19 DIAGNOSIS — I10 Essential (primary) hypertension: Secondary | ICD-10-CM

## 2017-12-19 DIAGNOSIS — E785 Hyperlipidemia, unspecified: Secondary | ICD-10-CM | POA: Diagnosis not present

## 2017-12-19 DIAGNOSIS — I739 Peripheral vascular disease, unspecified: Secondary | ICD-10-CM | POA: Diagnosis not present

## 2017-12-19 NOTE — Assessment & Plan Note (Signed)
History of peripheral arterial disease status post left SFA PTA by myself 12/11/2013.  Follow-up Dopplers performed 12/19/2013 revealed normal ABIs bilaterally with a widely patent SFA.  Recent ABIs showed noncompressible tibial vessels.  He had calluses removed on both feet by Dr. Milinda Pointer which have been slow to heal.  His toe brachial indices were low.

## 2017-12-19 NOTE — Assessment & Plan Note (Signed)
History of CAD status post off-pump LIMA to the LAD at [facility] October 2018.  This was done during preoperative assessment for renal transplant.  He underwent cardiac catheterization by Dr. Ellyn Hack 04/27/2017 revealing a patent LIMA to the LAD with otherwise noncritical CAD and normal LV function.  He denies chest pain or shortness of breath.

## 2017-12-19 NOTE — Assessment & Plan Note (Signed)
CTA of the neck performed 06/28/2017 did not show significant obstructive disease in the internal carotid arteries bilaterally.

## 2017-12-19 NOTE — Assessment & Plan Note (Signed)
History of essential hypertension blood pressure measured at 102/72.  He is not on antihypertensive medications.

## 2017-12-19 NOTE — Assessment & Plan Note (Signed)
History of hyperlipidemia on statin therapy followed by his PCP 

## 2017-12-19 NOTE — Progress Notes (Signed)
12/19/2017 Jon Anderson   [birth year]  [id number]  Primary Physician [name], MD Primary Cardiologist: Lorretta Harp MD Jon Anderson, [place]  HPI:  Jon Anderson is a 68 y.o. mildly overweight married African American male father of 2, grandfather Of one grandchild who Works as a Sports coach. He was referred by Dr. Scherrie November for peripheral vascular evaluation prior to elective foot surgery. His primary care physician is Dr. Mellody Drown I last saw him in the office 06/28/2017.  His cardiac risk factors include hypertension, diabetes and hyperlipidemia. He does not smoke. His sister died of a myocardial infarction at age 71. He has never had a heart attack or stroke and has had cardiac catheterization remotely that showed noncritical CAD. He does get fairly frequent chest pain and shortness of breath however. He has chronic renal sufficiency with creatinines in the mid 3 range thought to chronic kidney. Lower extremity arterial Doppler studies performed in the office 09/01/13 revealed a right ABI 1.1 the left of 0.76 with a high-frequency signal in the distal left SFA. He does complain of left calf claudication which is lifestyle limiting and affecting his ability to perform work.. There is no history of nonhealing wounds. Recent carotid Dopplers showed moderately severe left internal carotid artery stenosis.  Since I saw him last he has gone on dialysis February 2016. He also underwent off-pump LIMA insertion at [facility] in October of last year because of a high grade lesion that was discovered during a workup for renal transplant. He will cardiac catheterization by Dr. Ellyn Hack 04/27/17 revealed a patent LIMA to the LAD with otherwise noncritical disease and normal LV function. He currently denies chest pain. He is scheduled for laminectomy on 07/03/17. Carotid Dopplers performed at [facility] imaging showed moderately severe left ICA stenosis which when compared to  the Dopplers performed in our office 10/15/13, did not appear significantly different. However, given the severity of the lesion I n the 70-90% range CTA of his neck revealing no significant obstructive disease in his carotid circulation.  Since I saw him 6 months ago he is remained stable.  He did have calluses removed by Dr. Milinda Pointer bilaterally which have been slow to heal.  Segmental pressures recently performed in our office suggested noncompressible vessels with abnormal TBI's.    Current Meds  Medication Sig  . acetaminophen (TYLENOL) 500 MG tablet Take 1,000 mg by mouth 2 (two) times daily as needed for moderate pain or headache.  Marland Kitchen aspirin EC 81 MG tablet Take 1 tablet (81 mg total) by mouth daily.  Marland Kitchen atorvastatin (LIPITOR) 40 MG tablet Take 1 tablet (40 mg total) by mouth daily at 6 PM.  . calcium acetate (PHOSLO) 667 MG capsule Take 3 capsules (2,001 mg total) by mouth 3 (three) times daily with meals. (Patient taking differently: Take 1,334 mg by mouth 2 (two) times daily with a meal. )  . clopidogrel (PLAVIX) 75 MG tablet Take 1 tablet (75 mg total) by mouth daily with breakfast.  . cyclobenzaprine (FLEXERIL) 10 MG tablet Take 10 mg by mouth 3 (three) times daily.  . diclofenac (VOLTAREN) 75 MG EC tablet Take 75 mg by mouth 2 (two) times daily.  Marland Kitchen gabapentin (NEURONTIN) 100 MG capsule Take 100 mg by mouth 2 (two) times daily.  Marland Kitchen glycerin adult 2 g suppository Place 1 suppository rectally as needed for constipation.  . multivitamin (RENA-VIT) TABS tablet Take 1 tablet by mouth at bedtime.  . nitroGLYCERIN (NITROSTAT)  0.4 MG SL tablet Place 1 tablet (0.4 mg total) under the tongue every 5 (five) minutes as needed for chest pain.  Marland Kitchen oxyCODONE (OXY IR/ROXICODONE) 5 MG immediate release tablet Take 1 tablet (5 mg total) by mouth every 4 (four) hours as needed for moderate pain or severe pain ((score 4 to 6)).  Marland Kitchen pantoprazole (PROTONIX) 40 MG tablet Take 40 mg by mouth daily.  . polyethylene  glycol (MIRALAX / GLYCOLAX) packet Take 17 g by mouth daily as needed for moderate constipation.  Marland Kitchen rOPINIRole (REQUIP) 0.5 MG tablet Take 0.5 mg by mouth 2 (two) times daily.   . [DISCONTINUED] cyclobenzaprine (FLEXERIL) 10 MG tablet Take 1 tablet (10 mg total) by mouth 3 (three) times daily as needed for muscle spasms.  . [DISCONTINUED] methocarbamol (ROBAXIN) 500 MG tablet Take 1 tablet (500 mg total) by mouth every 6 (six) hours as needed for muscle spasms.  . [DISCONTINUED] metoprolol succinate (TOPROL-XL) 50 MG 24 hr tablet Take 1 tablet by mouth one hour prior to CTA.     Allergies  Allergen Reactions  . Kiwi Extract Swelling and Other (See Comments)    Facial swelling   . Tape Other (See Comments)    Plastic tape-blistering    Social History   Socioeconomic History  . Marital status: Married    Spouse name: Not on file  . Number of children: Not on file  . Years of education: Not on file  . Highest education level: Not on file  Occupational History  . Not on file  Social Needs  . Financial resource strain: Not on file  . Food insecurity:    Worry: Not on file    Inability: Not on file  . Transportation needs:    Medical: Not on file    Non-medical: Not on file  Tobacco Use  . Smoking status: Former Smoker    Packs/day: 1.00    Years: 2.00    Pack years: 2.00    Types: Cigarettes  . Smokeless tobacco: Never Used  . Tobacco comment: quit smoking 40 yrs ago  Substance and Sexual Activity  . Alcohol use: No    Alcohol/week: 0.0 standard drinks  . Drug use: No  . Sexual activity: Yes    Birth control/protection: None  Lifestyle  . Physical activity:    Days per week: Not on file    Minutes per session: Not on file  . Stress: Not on file  Relationships  . Social connections:    Talks on phone: Not on file    Gets together: Not on file    Attends religious service: Not on file    Active member of club or organization: Not on file    Attends meetings of clubs  or organizations: Not on file    Relationship status: Not on file  . Intimate partner violence:    Fear of current or ex partner: Not on file    Emotionally abused: Not on file    Physically abused: Not on file    Forced sexual activity: Not on file  Other Topics Concern  . Not on file  Social History Narrative   The patient is a Retail buyer.  He is married and has 2 grown children.  Lives in [place] with his wife.  Denies tobacco, alcohol or IV drug abuse or marijuana or cocaine intake.      Review of Systems: General: negative for chills, fever, night sweats or weight changes.  Cardiovascular: negative for chest pain,  dyspnea on exertion, edema, orthopnea, palpitations, paroxysmal nocturnal dyspnea or shortness of breath Dermatological: negative for rash Respiratory: negative for cough or wheezing Urologic: negative for hematuria Abdominal: negative for nausea, vomiting, diarrhea, bright red blood per rectum, melena, or hematemesis Neurologic: negative for visual changes, syncope, or dizziness All other systems reviewed and are otherwise negative except as noted above.    Blood pressure 102/72, pulse 90, height 5\' 11"  (1.803 m), weight 158 lb (71.7 kg).  General appearance: alert and no distress Neck: no adenopathy, no carotid bruit, no JVD, supple, symmetrical, trachea midline and thyroid not enlarged, symmetric, no tenderness/mass/nodules Lungs: clear to auscultation bilaterally Heart: regular rate and rhythm, S1, S2 normal, no murmur, click, rub or gallop Extremities: extremities normal, atraumatic, no cyanosis or edema Pulses: Diminished pedal pulses bilaterally Skin: Skin color, texture, turgor normal. No rashes or lesions Neurologic: Alert and oriented X 3, normal strength and tone. Normal symmetric reflexes. Normal coordination and gait  EKG not performed today  ASSESSMENT AND PLAN:   Hyperlipidemia LDL goal <70 History of hyperlipidemia on statin therapy followed by his  PCP  Hypertension History of essential hypertension blood pressure measured at 102/72.  He is not on antihypertensive medications.  CAD (coronary artery disease) nonobstructive per cath 2012 History of CAD status post off-pump LIMA to the LAD at [facility] October 2018.  This was done during preoperative assessment for renal transplant.  He underwent cardiac catheterization by Dr. Ellyn Hack 04/27/2017 revealing a patent LIMA to the LAD with otherwise noncritical CAD and normal LV function.  He denies chest pain or shortness of breath.  Carotid artery disease (HCC) CTA of the neck performed 06/28/2017 did not show significant obstructive disease in the internal carotid arteries bilaterally.  PVD (peripheral vascular disease) ([place]) History of peripheral arterial disease status post left SFA PTA by myself 12/11/2013.  Follow-up Dopplers performed 12/19/2013 revealed normal ABIs bilaterally with a widely patent SFA.  Recent ABIs showed noncompressible tibial vessels.  He had calluses removed on both feet by Dr. Milinda Pointer which have been slow to heal.  His toe brachial indices were low.      Lorretta Harp MD FACP,FACC,FAHA, [facility] 12/19/2017 4:07 PM

## 2017-12-19 NOTE — Patient Instructions (Signed)

## 2017-12-25 ENCOUNTER — Encounter: Payer: Self-pay | Admitting: Physical Therapy

## 2017-12-25 ENCOUNTER — Ambulatory Visit: Payer: Medicare Other | Admitting: Physical Therapy

## 2017-12-25 DIAGNOSIS — M6281 Muscle weakness (generalized): Secondary | ICD-10-CM

## 2017-12-25 DIAGNOSIS — G8929 Other chronic pain: Secondary | ICD-10-CM

## 2017-12-25 DIAGNOSIS — M545 Low back pain, unspecified: Secondary | ICD-10-CM

## 2017-12-25 DIAGNOSIS — R293 Abnormal posture: Secondary | ICD-10-CM

## 2017-12-25 DIAGNOSIS — M62830 Muscle spasm of back: Secondary | ICD-10-CM

## 2017-12-25 NOTE — Therapy (Signed)
[place], [place], [address] Phone: [phone]   Fax:  [phone]  Physical Therapy Treatment  Patient Details  Name: Jon Anderson MRN: [id number] Date of Birth: 1951/04/08 Referring Provider: Dr Ferne Reus    Encounter Date: 12/25/2017  PT End of Session - 12/25/17 1334    Visit Number  2    Number of Visits  16    Date for PT Re-Evaluation  02/12/18    PT Start Time  0845    PT Stop Time  0944    PT Time Calculation (min)  59 min    Activity Tolerance  Patient tolerated treatment well    Behavior During Therapy  [facility] for tasks assessed/performed       Past Medical History:  Diagnosis Date  . Anemia of chronic disease   . CAD (coronary artery disease)    a.  Myoview 4/11: EF 53%, no scar or ischemia   c. MV 2012 Nl perfusion, apical thinning.  No ischemia or scar.  EF 49%, appears greater by visual estimate.;  d.  Dob stress echo 12/13:  Negative Dob stress echo. There is no evidence of ischemia.  The LVF is normal. b. Normal cors 2016.  . Carotid stenosis    a. <34% RICA, >74% LICA by duplex 05/5954  . Chronic chest pain    occ  . ESRD (end stage renal disease) on dialysis ([place])    M-W-F  . GERD (gastroesophageal reflux disease)   . HNP (herniated nucleus pulposus), lumbar   . HTN (hypertension)    echo 3/10: EF 60%, LAE  . Hyperlipidemia   . Nephrolithiasis    "passed them all"  . Peripheral arterial disease ([place])    a. s/p PTCA to L SFA.  Marland Kitchen Pneumonia   . Restless legs   . Sleep apnea    no cpap, needs to reschedule appointment to set up aquiring cpap  . Snores    a. presumed OSA, pt has refused sleep eval in past.  . Type II diabetes mellitus ([place])    no longer on medications, checks blood glucose at home    Past Surgical History:  Procedure Laterality Date  . ANGIOPLASTY / STENTING FEMORAL Left 12/11/2013   dr berry  . AV FISTULA PLACEMENT Left 03/19/2014    Procedure: CREATION OF ARTERIOVENOUS (AV) FISTULA  LEFT UPPER ARM;  Surgeon: Mal Misty, MD;  Location: [facility];  Service: Vascular;  Laterality: Left;  . CARDIAC CATHETERIZATION  2001 and 2010   . COLONOSCOPY W/ BIOPSIES AND POLYPECTOMY    . COLONOSCOPY WITH PROPOFOL N/A 08/01/2016   Procedure: COLONOSCOPY WITH PROPOFOL;  Surgeon: Carol Ada, MD;  [facility];  Service: Endoscopy;  Laterality: N/A;  . ESOPHAGOGASTRODUODENOSCOPY (EGD) WITH PROPOFOL N/A 08/01/2016   Procedure: ESOPHAGOGASTRODUODENOSCOPY (EGD) WITH PROPOFOL;  Surgeon: Carol Ada, MD;  [facility];  Service: Endoscopy;  Laterality: N/A;  . FOOT FRACTURE SURGERY Right   . FRACTURE SURGERY    . INGUINAL HERNIA REPAIR Left   . LEFT HEART CATH AND CORS/GRAFTS ANGIOGRAPHY N/A 04/27/2017   Procedure: LEFT HEART CATH AND CORS/GRAFTS ANGIOGRAPHY;  Surgeon: Leonie Man, MD;  Location: [facility];  Service: Cardiovascular;  Laterality: N/A;  . LEFT HEART CATHETERIZATION WITH CORONARY ANGIOGRAM N/A 06/22/2014   Procedure: LEFT HEART CATHETERIZATION WITH CORONARY ANGIOGRAM;  Surgeon: Troy Sine, MD;  Location: [facility] CATH LAB;  Service: Cardiovascular;  Laterality: N/A;  . LOWER  EXTREMITY ANGIOGRAM Left 12/11/2013   Procedure: LOWER EXTREMITY ANGIOGRAM;  Surgeon: Lorretta Harp, MD;  Location: [facility] CATH LAB;  Service: Cardiovascular;  Laterality: Left;  . LUMBAR LAMINECTOMY/DECOMPRESSION MICRODISCECTOMY Right 07/03/2017   Procedure: MICRODISCECTOMY LUMBAR FIVE - SACRAL ONE RIGHT;  Surgeon: Consuella Lose, MD;  Location: [place] [facility];  Service: Neurosurgery;  Laterality: Right;  . LUMBAR LAMINECTOMY/DECOMPRESSION MICRODISCECTOMY Right 10/19/2017   Procedure: MICRODISCECTOMY LUMBAR FIVE- SACRAL 1 ONE ;  Surgeon: Consuella Lose, MD;  Location: [facility];  Service: Neurosurgery;  Laterality: Right;  . TONSILLECTOMY AND ADENOIDECTOMY      There were no vitals filed for this visit.  Subjective Assessment - 12/25/17  0853    Subjective  I am gettin g some of the exzercises done/  they do not hurt.    Currently in Pain?  Yes    Pain Score  4     Pain Location  Back    Pain Orientation  Right;Lower    Pain Descriptors / Indicators  Aching;Shooting   heat,  hard to describe ito leg.    Pain Type  Chronic pain    Pain Radiating Towards  into hip now.  goes to feet    Pain Frequency  Intermittent    Aggravating Factors   standing ,  ADL, longer driving    Pain Relieving Factors  sitting shorter times.hot pack,  change of position,  hot pack tylenol ,  oxycodone.     Effect of Pain on Daily Activities  not sleeping well,  standing.  walking difficult.    Multiple Pain Sites  --   left knee feels swollen and leg cold,  long standing narrowing of arteries to feet.  perpherial neuropathy. sitting lonng,  standing ,  washing dishes,                        [facility] Adult PT Treatment/Exercise - 12/25/17 0001      Self-Care   Self-Care  --   anatomy,  how tightness affects pain cycle     Lumbar Exercises: Stretches   Active Hamstring Stretch Limitations  seated 3x20 sec hold     Hip Flexor Stretch Limitations  limited ROM,  painful   stretched prone   Other Lumbar Stretch Exercise  QL on side and sitting practiced added to HEP    Other Lumbar Stretch Exercise  prone over 3 pillows   also both hip IR/ER PROM while prone, ROM limited and painfu     Lumbar Exercises: Prone   Other Prone Lumbar Exercises  stretch over pillows ( 3)      Moist Heat Therapy   Number Minutes Moist Heat  15 Minutes    Moist Heat Location  Lumbar Spine      Electrical Stimulation   Electrical Stimulation Location  low back,  right    Electrical Stimulation Action  IFC    Electrical Stimulation Parameters  to tolerance    Electrical Stimulation Goals  Pain   sitting     Manual Therapy   Manual therapy comments  light manual not tolerated,  PROM QL in PNF pattern to decrease tension in low back.               PT Education - 12/25/17 1329    Education Details  Anatomy.  Exrecise  form    Person(s) Educated  Patient    Methods  Explanation;Demonstration    Comprehension  Verbalized understanding  PT Short Term Goals - 12/25/17 1338      PT SHORT TERM GOAL #1   Title  pt will be I with inital HEP (06/06/2016)    Baseline  says he is doing    Time  4    Period  Weeks    Status  On-going      PT SHORT TERM GOAL #2   Title  Patient will increase right hip flexion strength to 4/5     Time  4    Period  Weeks    Status  Unable to assess      PT SHORT TERM GOAL #3   Title  Patient will increase lumbar flexion by 25%     Baseline  working on with HEP    Time  4    Status  On-going        PT Long Term Goals - 12/18/17 1537      PT LONG TERM GOAL #1   Title  Patient will bend to pick item off the ground without significant pain in order to perfrom ADL's     Time  8    Period  Weeks    Status  New    Target Date  02/12/18      PT LONG TERM GOAL #2   Title  Patient will stand for 30 min without increased pain in order to perfrom ADL's     Time  8    Period  Weeks    Status  New    Target Date  02/12/18      PT LONG TERM GOAL #3   Title  Patient will improve FOTO score to 45% to demonstrate improved function     Time  8    Period  Weeks    Status  New    Target Date  02/12/18            Plan - 12/25/17 1335    Clinical Impression Statement  Most exercise painful today. ROM limited in hips and low back,  Patient can improve posture briefly however the pain increases.  Modalities most helpful to decrease pain today.      PT Next Visit Plan  assess modalities and continue if helpful,  Stretch QL, quads, hip flexor, hamstrings and calf,  progress HEP as able    PT Home Exercise Plan  Continue exercises from previous visit.     Consulted and Agree with Plan of Care  Patient       Patient will benefit from skilled therapeutic intervention in order to  improve the following deficits and impairments:     Visit Diagnosis: Chronic right-sided low back pain without sciatica  Muscle spasm of back  Abnormal posture  Muscle weakness (generalized)     Problem List Patient Active Problem List   Diagnosis Date Noted  . HNP (herniated nucleus pulposus), lumbar 07/03/2017  . Chest pain in adult 04/27/2017  . Restless leg syndrome, uncontrolled 04/27/2017  . 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 Anemia of chronic disease 03/17/2017  . Severe uncontrolled hypertension 03/17/2017  . ESRD (end stage renal disease) on dialysis ([place]) 03/17/2017  . Hyperlipidemia 03/17/2017  . Chronic chest pain 03/17/2017  . Type II diabetes mellitus ([place]) 03/17/2017  . Acute on chronic diastolic heart failure ([place]) 03/17/2017  . Acute heart failure ([place]) 03/17/2017  . Lumbar radiculopathy 01/16/2017  . Chest pain, non-cardiac 10/09/2015  . Acute on chronic renal failure ([place]) 06/16/2014  .  Renal failure (ARF), acute on chronic (HCC) 06/16/2014  . Shoulder pain, left 12/15/2013  . Chest pain 12/15/2013  . Claudication ([place]) 12/11/2013  . PVD (peripheral vascular disease) ([place]) 12/11/2013  . Peripheral arterial disease ([place]) 09/30/2013  . Carotid artery disease ([place]) 09/30/2013  . Acute chest pain 11/15/2012  . Left-sided chest wall pain 03/19/2012  . Bruit 09/15/2010  . CAD (coronary artery disease) nonobstructive per cath 2012   . Chronic kidney disease (CKD), stage IV (severe) ([place])   . Precordial chest pain 07/26/2009  . DM (diabetes mellitus) ([place]) 07/22/2009  . Hyperlipidemia LDL goal <70 07/22/2009  . Hypertension 07/22/2009    Kalem Rockwell  PTA 12/25/2017, 1:41 PM  [facility] [address] [place], [place], [address] Phone: [phone]   Fax:  [phone]  Name: ANIKEN MONESTIME MRN: [id number] Date of Birth: 11-20-1950

## 2017-12-27 ENCOUNTER — Encounter: Payer: Self-pay | Admitting: Physical Therapy

## 2017-12-27 ENCOUNTER — Ambulatory Visit: Payer: Medicare Other | Admitting: Physical Therapy

## 2017-12-27 DIAGNOSIS — M62830 Muscle spasm of back: Secondary | ICD-10-CM

## 2017-12-27 DIAGNOSIS — M6281 Muscle weakness (generalized): Secondary | ICD-10-CM

## 2017-12-27 DIAGNOSIS — R293 Abnormal posture: Secondary | ICD-10-CM

## 2017-12-27 DIAGNOSIS — G8929 Other chronic pain: Secondary | ICD-10-CM

## 2017-12-27 DIAGNOSIS — M545 Low back pain, unspecified: Secondary | ICD-10-CM

## 2017-12-27 NOTE — Therapy (Signed)
[name], [place], [address] Phone: [phone]   Fax:  [phone]  Physical Therapy Treatment  Patient Details  Name: Jon Anderson MRN: [id number] Date of Birth: Nov 18, 1950 Referring Provider: Dr Ferne Reus    Encounter Date: 12/27/2017  PT End of Session - 12/27/17 0859    Visit Number  3    Number of Visits  16    Date for PT Re-Evaluation  02/12/18    Authorization Type  medicare triad     PT Start Time  0845    PT Stop Time  0941    PT Time Calculation (min)  56 min    Activity Tolerance  Patient tolerated treatment well    Behavior During Therapy  [facility] for tasks assessed/performed       Past Medical History:  Diagnosis Date  . Anemia of chronic disease   . CAD (coronary artery disease)    a.  Myoview 4/11: EF 53%, no scar or ischemia   c. MV 2012 Nl perfusion, apical thinning.  No ischemia or scar.  EF 49%, appears greater by visual estimate.;  d.  Dob stress echo 12/13:  Negative Dob stress echo. There is no evidence of ischemia.  The LVF is normal. b. Normal cors 2016.  . Carotid stenosis    a. <90% RICA, >24% LICA by duplex 0/9735  . Chronic chest pain    occ  . ESRD (end stage renal disease) on dialysis ([place])    M-W-F  . GERD (gastroesophageal reflux disease)   . HNP (herniated nucleus pulposus), lumbar   . HTN (hypertension)    echo 3/10: EF 60%, LAE  . Hyperlipidemia   . Nephrolithiasis    "passed them all"  . Peripheral arterial disease ([place])    a. s/p PTCA to L SFA.  Marland Kitchen Pneumonia   . Restless legs   . Sleep apnea    no cpap, needs to reschedule appointment to set up aquiring cpap  . Snores    a. presumed OSA, pt has refused sleep eval in past.  . Type II diabetes mellitus ([place])    no longer on medications, checks blood glucose at home    Past Surgical History:  Procedure Laterality Date  . ANGIOPLASTY / STENTING FEMORAL Left 12/11/2013   dr berry  . AV  FISTULA PLACEMENT Left 03/19/2014   Procedure: CREATION OF ARTERIOVENOUS (AV) FISTULA  LEFT UPPER ARM;  Surgeon: Mal Misty, MD;  Location: [facility];  Service: Vascular;  Laterality: Left;  . CARDIAC CATHETERIZATION  2001 and 2010   . COLONOSCOPY W/ BIOPSIES AND POLYPECTOMY    . COLONOSCOPY WITH PROPOFOL N/A 08/01/2016   Procedure: COLONOSCOPY WITH PROPOFOL;  Surgeon: Carol Ada, MD;  [facility];  Service: Endoscopy;  Laterality: N/A;  . ESOPHAGOGASTRODUODENOSCOPY (EGD) WITH PROPOFOL N/A 08/01/2016   Procedure: ESOPHAGOGASTRODUODENOSCOPY (EGD) WITH PROPOFOL;  Surgeon: Carol Ada, MD;  [facility];  Service: Endoscopy;  Laterality: N/A;  . FOOT FRACTURE SURGERY Right   . FRACTURE SURGERY    . INGUINAL HERNIA REPAIR Left   . LEFT HEART CATH AND CORS/GRAFTS ANGIOGRAPHY N/A 04/27/2017   Procedure: LEFT HEART CATH AND CORS/GRAFTS ANGIOGRAPHY;  Surgeon: Leonie Man, MD;  Location: [place];  Service: Cardiovascular;  Laterality: N/A;  . LEFT HEART CATHETERIZATION WITH CORONARY ANGIOGRAM N/A 06/22/2014   Procedure: LEFT HEART CATHETERIZATION WITH CORONARY ANGIOGRAM;  Surgeon: Troy Sine, MD;  Location: [facility] CATH LAB;  Service: Cardiovascular;  Laterality: N/A;  . LOWER EXTREMITY ANGIOGRAM Left 12/11/2013   Procedure: LOWER EXTREMITY ANGIOGRAM;  Surgeon: Lorretta Harp, MD;  Location: [facility] CATH LAB;  Service: Cardiovascular;  Laterality: Left;  . LUMBAR LAMINECTOMY/DECOMPRESSION MICRODISCECTOMY Right 07/03/2017   Procedure: MICRODISCECTOMY LUMBAR FIVE - SACRAL ONE RIGHT;  Surgeon: Consuella Lose, MD;  Location: [place];  Service: Neurosurgery;  Laterality: Right;  . LUMBAR LAMINECTOMY/DECOMPRESSION MICRODISCECTOMY Right 10/19/2017   Procedure: MICRODISCECTOMY LUMBAR FIVE- SACRAL 1 ONE ;  Surgeon: Consuella Lose, MD;  Location: [place];  Service: Neurosurgery;  Laterality: Right;  . TONSILLECTOMY AND ADENOIDECTOMY      There were no vitals filed for this  visit.  Subjective Assessment - 12/27/17 0850    Subjective  Patient reports his back s not hurting too much this morning but he continues to have pain when he stands for too long.     Pertinent History  Patient has dialysis 3 days a week; bilateral peripheral neuropathy     How long can you walk comfortably?  < 10 minutes     Patient Stated Goals  to be able to stand longer     Currently in Pain?  No/denies                       [facility] Adult PT Treatment/Exercise - 12/27/17 0001      Exercises   Exercises  Lumbar      Lumbar Exercises: Stretches   Active Hamstring Stretch Limitations  seated 3x20 sec hold     Lower Trunk Rotation Limitations  x10     Other Lumbar Stretch Exercise  --      Lumbar Exercises: Supine   AB Set Limitations  reviewed abdominal breathing     Clam Limitations  2x10 yellow     Bent Knee Raise Limitations  2x10     Other Supine Lumbar Exercises  --    Other Supine Lumbar Exercises  ball squeeze 2x10       Moist Heat Therapy   Number Minutes Moist Heat  15 Minutes    Moist Heat Location  Lumbar Spine      Electrical Stimulation   Electrical Stimulation Location  low back,  right    Electrical Stimulation Action  IFC     Electrical Stimulation Parameters  to tolerance     Electrical Stimulation Goals  Pain   sitting     Manual Therapy   Manual therapy comments  soft tissue mobilization and trigger point release to lumbar spine in side lying              PT Education - 12/27/17 0857    Education Details  reviewed the improtanceof strengthening to improve ability to stand \    Person(s) Educated  Patient    Methods  Explanation    Comprehension  Verbalized understanding;Returned demonstration;Verbal cues required;Tactile cues required       PT Short Term Goals - 12/27/17 1536      PT SHORT TERM GOAL #1   Title  pt will be I with inital HEP (06/06/2016)    Baseline  says he is doing    Time  4    Period  Weeks    Status   On-going      PT SHORT TERM GOAL #2   Title  Patient will increase right hip flexion strength to 4/5     Time  4    Period  Weeks  Status  Unable to assess      PT SHORT TERM GOAL #3   Title  Patient will increase lumbar flexion by 25%     Baseline  working on with HEP    Time  4    Period  Weeks    Status  On-going        PT Long Term Goals - 12/18/17 1537      PT LONG TERM GOAL #1   Title  Patient will bend to pick item off the ground without significant pain in order to perfrom ADL's     Time  8    Period  Weeks    Status  New    Target Date  02/12/18      PT LONG TERM GOAL #2   Title  Patient will stand for 30 min without increased pain in order to perfrom ADL's     Time  8    Period  Weeks    Status  New    Target Date  02/12/18      PT LONG TERM GOAL #3   Title  Patient will improve FOTO score to 45% to demonstrate improved function     Time  8    Period  Weeks    Status  New    Target Date  02/12/18            Plan - 12/27/17 0903    Clinical Impression Statement  Therapy advanced strengthening exercises. He tolerated well. He doid report some leg pain with the nu-step but it was difficult to tell if it was muscle burn or actual radicualr pain. He had no increase in pain with new exercises. He ewas given rotations,, ball squeez, and calmshell for home with instrution on symptom managment. The patient appears to have a significant improvement in spasming of his quadratus.     Clinical Presentation  Evolving    Clinical Decision Making  Moderate    Rehab Potential  Good    PT Frequency  2x / week    PT Duration  8 weeks    PT Treatment/Interventions  ADLs/Self Care Home Management;Cryotherapy;Electrical Stimulation;Iontophoresis 4mg /ml Dexamethasone;Ultrasound;DME Instruction;Therapeutic exercise;Therapeutic activities;Gait training;Neuromuscular re-education;Patient/family education;Manual techniques;Dry needling    PT Next Visit Plan  assess modalities  and continue if helpful,  Stretch QL, quads, hip flexor, hamstrings and calf,  progress HEP as able    PT Home Exercise Plan  Continue exercises from previous visit.     Consulted and Agree with Plan of Care  Patient       Patient will benefit from skilled therapeutic intervention in order to improve the following deficits and impairments:  Abnormal gait, Pain, Decreased endurance, Decreased range of motion, Decreased strength, Decreased activity tolerance, Increased muscle spasms, Difficulty walking  Visit Diagnosis: Chronic right-sided low back pain without sciatica  Muscle spasm of back  Abnormal posture  Muscle weakness (generalized)     Problem List Patient Active Problem List   Diagnosis Date Noted  . HNP (herniated nucleus pulposus), lumbar 07/03/2017  . Chest pain in adult 04/27/2017  . Restless leg syndrome, uncontrolled 04/27/2017  . 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 Anemia of chronic disease 03/17/2017  . Severe uncontrolled hypertension 03/17/2017  . ESRD (end stage renal disease) on dialysis ([place]) 03/17/2017  . Hyperlipidemia 03/17/2017  . Chronic chest pain 03/17/2017  . Type II diabetes mellitus ([place]) 03/17/2017  . Acute on chronic diastolic heart failure ([place]) 03/17/2017  . Acute  heart failure ([place]) 03/17/2017  . Lumbar radiculopathy 01/16/2017  . Chest pain, non-cardiac 10/09/2015  . Acute on chronic renal failure ([place]) 06/16/2014  . Renal failure (ARF), acute on chronic (HCC) 06/16/2014  . Shoulder pain, left 12/15/2013  . Chest pain 12/15/2013  . Claudication ([place]) 12/11/2013  . PVD (peripheral vascular disease) ([place]) 12/11/2013  . Peripheral arterial disease ([place]) 09/30/2013  . Carotid artery disease ([place]) 09/30/2013  . Acute chest pain 11/15/2012  . Left-sided chest wall pain 03/19/2012  . Bruit 09/15/2010  . CAD (coronary artery disease) nonobstructive per cath 2012   . Chronic kidney disease (CKD), stage IV (severe) ([place])   . Precordial  chest pain 07/26/2009  . DM (diabetes mellitus) ([place]) 07/22/2009  . Hyperlipidemia LDL goal <70 07/22/2009  . Hypertension 07/22/2009    Carney Living PT DPT  12/27/2017, 3:40 PM  [facility] [address] [place], [place], [address] Phone: [phone]   Fax:  [phone]  Name: Jon Anderson MRN: [id number] Date of Birth: [birth year]

## 2018-01-01 ENCOUNTER — Encounter: Payer: Self-pay | Admitting: Physical Therapy

## 2018-01-01 ENCOUNTER — Ambulatory Visit: Payer: Medicare Other | Admitting: Physical Therapy

## 2018-01-01 DIAGNOSIS — M62830 Muscle spasm of back: Secondary | ICD-10-CM

## 2018-01-01 DIAGNOSIS — M6281 Muscle weakness (generalized): Secondary | ICD-10-CM

## 2018-01-01 DIAGNOSIS — R293 Abnormal posture: Secondary | ICD-10-CM

## 2018-01-01 DIAGNOSIS — M545 Low back pain: Secondary | ICD-10-CM | POA: Diagnosis not present

## 2018-01-01 DIAGNOSIS — G8929 Other chronic pain: Secondary | ICD-10-CM

## 2018-01-01 NOTE — Therapy (Addendum)
[place], [place], [address] Phone: [phone]   Fax:  [phone]  Physical Therapy Treatment  Patient Details  Name: Jon Anderson MRN: [id number] Date of Birth: 1950/11/30 Referring Provider: Dr Ferne Reus    Encounter Date: 01/01/2018  PT End of Session - 01/01/18 1021    Visit Number  4    Number of Visits  16    Date for PT Re-Evaluation  02/12/18    Authorization Type  medicare triad     PT Start Time  1015    PT Stop Time  1034   Patient left early requesting to go see his MD    PT Time Calculation (min)  19 min    Activity Tolerance  Patient tolerated treatment well    Behavior During Therapy  [facility] for tasks assessed/performed       Past Medical History:  Diagnosis Date  . Anemia of chronic disease   . CAD (coronary artery disease)    a.  Myoview 4/11: EF 53%, no scar or ischemia   c. MV 2012 Nl perfusion, apical thinning.  No ischemia or scar.  EF 49%, appears greater by visual estimate.;  d.  Dob stress echo 12/13:  Negative Dob stress echo. There is no evidence of ischemia.  The LVF is normal. b. Normal cors 2016.  . Carotid stenosis    a. <93% RICA, >57% LICA by duplex 0/1779  . Chronic chest pain    occ  . ESRD (end stage renal disease) on dialysis ([place])    M-W-F  . GERD (gastroesophageal reflux disease)   . HNP (herniated nucleus pulposus), lumbar   . HTN (hypertension)    echo 3/10: EF 60%, LAE  . Hyperlipidemia   . Nephrolithiasis    "passed them all"  . Peripheral arterial disease ([place])    a. s/p PTCA to L SFA.  Marland Kitchen Pneumonia   . Restless legs   . Sleep apnea    no cpap, needs to reschedule appointment to set up aquiring cpap  . Snores    a. presumed OSA, pt has refused sleep eval in past.  . Type II diabetes mellitus ([place])    no longer on medications, checks blood glucose at home    Past Surgical History:  Procedure Laterality Date  . ANGIOPLASTY /  STENTING FEMORAL Left 12/11/2013   dr berry  . AV FISTULA PLACEMENT Left 03/19/2014   Procedure: CREATION OF ARTERIOVENOUS (AV) FISTULA  LEFT UPPER ARM;  Surgeon: Mal Misty, MD;  Location: [place];  Service: Vascular;  Laterality: Left;  . CARDIAC CATHETERIZATION  2001 and 2010   . COLONOSCOPY W/ BIOPSIES AND POLYPECTOMY    . COLONOSCOPY WITH PROPOFOL N/A 08/01/2016   Procedure: COLONOSCOPY WITH PROPOFOL;  Surgeon: Carol Ada, MD;  [facility];  Service: Endoscopy;  Laterality: N/A;  . ESOPHAGOGASTRODUODENOSCOPY (EGD) WITH PROPOFOL N/A 08/01/2016   Procedure: ESOPHAGOGASTRODUODENOSCOPY (EGD) WITH PROPOFOL;  Surgeon: Carol Ada, MD;  [facility];  Service: Endoscopy;  Laterality: N/A;  . FOOT FRACTURE SURGERY Right   . FRACTURE SURGERY    . INGUINAL HERNIA REPAIR Left   . LEFT HEART CATH AND CORS/GRAFTS ANGIOGRAPHY N/A 04/27/2017   Procedure: LEFT HEART CATH AND CORS/GRAFTS ANGIOGRAPHY;  Surgeon: Leonie Man, MD;  Location: [place] [facility];  Service: Cardiovascular;  Laterality: N/A;  . LEFT HEART CATHETERIZATION WITH CORONARY ANGIOGRAM N/A 06/22/2014   Procedure: LEFT HEART CATHETERIZATION WITH CORONARY ANGIOGRAM;  Surgeon: Troy Sine, MD;  Location: [facility] CATH LAB;  Service: Cardiovascular;  Laterality: N/A;  . LOWER EXTREMITY ANGIOGRAM Left 12/11/2013   Procedure: LOWER EXTREMITY ANGIOGRAM;  Surgeon: Lorretta Harp, MD;  Location: [facility] CATH LAB;  Service: Cardiovascular;  Laterality: Left;  . LUMBAR LAMINECTOMY/DECOMPRESSION MICRODISCECTOMY Right 07/03/2017   Procedure: MICRODISCECTOMY LUMBAR FIVE - SACRAL ONE RIGHT;  Surgeon: Consuella Lose, MD;  Location: [place];  Service: Neurosurgery;  Laterality: Right;  . LUMBAR LAMINECTOMY/DECOMPRESSION MICRODISCECTOMY Right 10/19/2017   Procedure: MICRODISCECTOMY LUMBAR FIVE- SACRAL 1 ONE ;  Surgeon: Consuella Lose, MD;  Location: [place];  Service: Neurosurgery;  Laterality: Right;  . TONSILLECTOMY AND  ADENOIDECTOMY      There were no vitals filed for this visit.  Subjective Assessment - 01/01/18 1018    Subjective  Patient reports no back pain but his neuropathy is flaired up today. He feels like he twited his right ankle. He also feels like he has fluid on his left knee. He reports that has been going on for about 2 weeks.     Pertinent History  Patient has dialysis 3 days a week; bilateral peripheral neuropathy     How long can you walk comfortably?  < 10 minutes     Patient Stated Goals  to be able to stand longer     Currently in Pain?  Yes    Pain Score  7     Pain Location  Ankle    Pain Orientation  Right;Lower    Pain Descriptors / Indicators  Aching    Pain Type  Chronic pain    Pain Onset  More than a month ago    Pain Frequency  Intermittent    Aggravating Factors   standing, pain at night     Pain Relieving Factors  sitting,    Effect of Pain on Daily Activities  pain that is keeping him from sleeping                        [facility] Adult PT Treatment/Exercise - 01/01/18 0001      Exercises   Exercises  Lumbar      Lumbar Exercises: Stretches   Active Hamstring Stretch Limitations  seated 3x20 sec hold     Lower Trunk Rotation Limitations  x10       Lumbar Exercises: Aerobic   Nustep  5 min L3       Lumbar Exercises: Supine   Clam Limitations  2x10 yellow     Bent Knee Raise Limitations  2x10              PT Education - 01/01/18 1020    Education Details  reviewed symptom mangement     Person(s) Educated  Patient    Methods  Explanation;Demonstration;Tactile cues;Verbal cues    Comprehension  Verbalized understanding;Returned demonstration;Verbal cues required;Tactile cues required;Need further instruction       PT Short Term Goals - 12/27/17 1536      PT SHORT TERM GOAL #1   Title  pt will be I with inital HEP (06/06/2016)    Baseline  says he is doing    Time  4    Period  Weeks    Status  On-going      PT SHORT TERM GOAL #2    Title  Patient will increase right hip flexion strength to 4/5     Time  4    Period  Weeks  Status  Unable to assess      PT SHORT TERM GOAL #3   Title  Patient will increase lumbar flexion by 25%     Baseline  working on with HEP    Time  4    Period  Weeks    Status  On-going        PT Long Term Goals - 12/18/17 1537      PT LONG TERM GOAL #1   Title  Patient will bend to pick item off the ground without significant pain in order to perfrom ADL's     Time  8    Period  Weeks    Status  New    Target Date  02/12/18      PT LONG TERM GOAL #2   Title  Patient will stand for 30 min without increased pain in order to perfrom ADL's     Time  8    Period  Weeks    Status  New    Target Date  02/12/18      PT LONG TERM GOAL #3   Title  Patient will improve FOTO score to 45% to demonstrate improved function     Time  8    Period  Weeks    Status  New    Target Date  02/12/18            Plan - 01/01/18 1614    Clinical Impression Statement  After about 20 min of exercise the patient reported his restless leg and his indegestion were too much for him. He reported he was hoping to go see his MD this afternnon. Therapy advised the patient if he wasnt feeling good there was no reason to continue., He reports he will be back Thursday,     Clinical Presentation  Evolving    Clinical Decision Making  Moderate    PT Frequency  2x / week    PT Duration  8 weeks    PT Treatment/Interventions  ADLs/Self Care Home Management;Cryotherapy;Electrical Stimulation;Iontophoresis 4mg /ml Dexamethasone;Ultrasound;DME Instruction;Therapeutic exercise;Therapeutic activities;Gait training;Neuromuscular re-education;Patient/family education;Manual techniques;Dry needling    PT Next Visit Plan  assess modalities and continue if helpful,  Stretch QL, quads, hip flexor, hamstrings and calf,  progress HEP as able    PT Home Exercise Plan  Continue exercises from previous visit.     Consulted  and Agree with Plan of Care  Patient       Patient will benefit from skilled therapeutic intervention in order to improve the following deficits and impairments:  Abnormal gait, Pain, Decreased endurance, Decreased range of motion, Decreased strength, Decreased activity tolerance, Increased muscle spasms, Difficulty walking  Visit Diagnosis: Chronic right-sided low back pain without sciatica  Muscle spasm of back  Abnormal posture  Muscle weakness (generalized)     Problem List Patient Active Problem List   Diagnosis Date Noted  . HNP (herniated nucleus pulposus), lumbar 07/03/2017  . Chest pain in adult 04/27/2017  . Restless leg syndrome, uncontrolled 04/27/2017  . 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 Anemia of chronic disease 03/17/2017  . Severe uncontrolled hypertension 03/17/2017  . ESRD (end stage renal disease) on dialysis ([place]) 03/17/2017  . Hyperlipidemia 03/17/2017  . Chronic chest pain 03/17/2017  . Type II diabetes mellitus ([place]) 03/17/2017  . Acute on chronic diastolic heart failure ([place]) 03/17/2017  . Acute heart failure ([place]) 03/17/2017  . Lumbar radiculopathy 01/16/2017  . Chest pain, non-cardiac 10/09/2015  . Acute on chronic renal  failure ([place]) 06/16/2014  . Renal failure (ARF), acute on chronic (HCC) 06/16/2014  . Shoulder pain, left 12/15/2013  . Chest pain 12/15/2013  . Claudication ([place]) 12/11/2013  . PVD (peripheral vascular disease) ([place]) 12/11/2013  . Peripheral arterial disease ([place]) 09/30/2013  . Carotid artery disease ([place]) 09/30/2013  . Acute chest pain 11/15/2012  . Left-sided chest wall pain 03/19/2012  . Bruit 09/15/2010  . CAD (coronary artery disease) nonobstructive per cath 2012   . Chronic kidney disease (CKD), stage IV (severe) ([place])   . Precordial chest pain 07/26/2009  . DM (diabetes mellitus) ([place]) 07/22/2009  . Hyperlipidemia LDL goal <70 07/22/2009  . Hypertension 07/22/2009    Carney Living PT DPT  01/01/2018,  4:19 PM  [facility] [address] [place], [place], [address] Phone: [phone]   Fax:  [phone]  Name: Jon Anderson MRN: [id number] Date of Birth: [birth year]

## 2018-01-03 ENCOUNTER — Ambulatory Visit: Payer: Medicare Other | Admitting: Physical Therapy

## 2018-01-03 ENCOUNTER — Encounter: Payer: Self-pay | Admitting: Physical Therapy

## 2018-01-03 DIAGNOSIS — G8929 Other chronic pain: Secondary | ICD-10-CM

## 2018-01-03 DIAGNOSIS — M545 Low back pain, unspecified: Secondary | ICD-10-CM

## 2018-01-03 DIAGNOSIS — M62830 Muscle spasm of back: Secondary | ICD-10-CM

## 2018-01-03 DIAGNOSIS — R293 Abnormal posture: Secondary | ICD-10-CM

## 2018-01-03 DIAGNOSIS — M6281 Muscle weakness (generalized): Secondary | ICD-10-CM

## 2018-01-03 NOTE — Therapy (Signed)
[place], [place], [address] Phone: [phone]   Fax:  [phone]  Physical Therapy Treatment  Patient Details  Name: Jon Anderson MRN: [id number] Date of Birth: 12/28/1950 Referring Provider (PT): Dr Ferne Reus    Encounter Date: 01/03/2018  PT End of Session - 01/03/18 1359    Visit Number  5    Number of Visits  16    Date for PT Re-Evaluation  02/12/18    Authorization Type  medicare triad     PT Start Time  0845    PT Stop Time  0926    PT Time Calculation (min)  41 min    Activity Tolerance  Patient tolerated treatment well    Behavior During Therapy  [facility] for tasks assessed/performed       Past Medical History:  Diagnosis Date  . Anemia of chronic disease   . CAD (coronary artery disease)    a.  Myoview 4/11: EF 53%, no scar or ischemia   c. MV 2012 Nl perfusion, apical thinning.  No ischemia or scar.  EF 49%, appears greater by visual estimate.;  d.  Dob stress echo 12/13:  Negative Dob stress echo. There is no evidence of ischemia.  The LVF is normal. b. Normal cors 2016.  . Carotid stenosis    a. <51% RICA, >76% LICA by duplex 04/6071  . Chronic chest pain    occ  . ESRD (end stage renal disease) on dialysis ([place])    M-W-F  . GERD (gastroesophageal reflux disease)   . HNP (herniated nucleus pulposus), lumbar   . HTN (hypertension)    echo 3/10: EF 60%, LAE  . Hyperlipidemia   . Nephrolithiasis    "passed them all"  . Peripheral arterial disease ([place])    a. s/p PTCA to L SFA.  Marland Kitchen Pneumonia   . Restless legs   . Sleep apnea    no cpap, needs to reschedule appointment to set up aquiring cpap  . Snores    a. presumed OSA, pt has refused sleep eval in past.  . Type II diabetes mellitus ([place])    no longer on medications, checks blood glucose at home    Past Surgical History:  Procedure Laterality Date  . ANGIOPLASTY / STENTING FEMORAL Left 12/11/2013   dr berry  . AV  FISTULA PLACEMENT Left 03/19/2014   Procedure: CREATION OF ARTERIOVENOUS (AV) FISTULA  LEFT UPPER ARM;  Surgeon: Mal Misty, MD;  Location: [place];  Service: Vascular;  Laterality: Left;  . CARDIAC CATHETERIZATION  2001 and 2010   . COLONOSCOPY W/ BIOPSIES AND POLYPECTOMY    . COLONOSCOPY WITH PROPOFOL N/A 08/01/2016   Procedure: COLONOSCOPY WITH PROPOFOL;  Surgeon: Carol Ada, MD;  [facility];  Service: Endoscopy;  Laterality: N/A;  . ESOPHAGOGASTRODUODENOSCOPY (EGD) WITH PROPOFOL N/A 08/01/2016   Procedure: ESOPHAGOGASTRODUODENOSCOPY (EGD) WITH PROPOFOL;  Surgeon: Carol Ada, MD;  [facility];  Service: Endoscopy;  Laterality: N/A;  . FOOT FRACTURE SURGERY Right   . FRACTURE SURGERY    . INGUINAL HERNIA REPAIR Left   . LEFT HEART CATH AND CORS/GRAFTS ANGIOGRAPHY N/A 04/27/2017   Procedure: LEFT HEART CATH AND CORS/GRAFTS ANGIOGRAPHY;  Surgeon: Leonie Man, MD;  Location: [place] CV LAB;  Service: Cardiovascular;  Laterality: N/A;  . LEFT HEART CATHETERIZATION WITH CORONARY ANGIOGRAM N/A 06/22/2014   Procedure: LEFT HEART CATHETERIZATION WITH CORONARY ANGIOGRAM;  Surgeon: Troy Sine, MD;  Location: [facility] CATH  LAB;  Service: Cardiovascular;  Laterality: N/A;  . LOWER EXTREMITY ANGIOGRAM Left 12/11/2013   Procedure: LOWER EXTREMITY ANGIOGRAM;  Surgeon: Lorretta Harp, MD;  Location: [facility] CATH LAB;  Service: Cardiovascular;  Laterality: Left;  . LUMBAR LAMINECTOMY/DECOMPRESSION MICRODISCECTOMY Right 07/03/2017   Procedure: MICRODISCECTOMY LUMBAR FIVE - SACRAL ONE RIGHT;  Surgeon: Consuella Lose, MD;  Location: [place];  Service: Neurosurgery;  Laterality: Right;  . LUMBAR LAMINECTOMY/DECOMPRESSION MICRODISCECTOMY Right 10/19/2017   Procedure: MICRODISCECTOMY LUMBAR FIVE- SACRAL 1 ONE ;  Surgeon: Consuella Lose, MD;  Location: [place];  Service: Neurosurgery;  Laterality: Right;  . TONSILLECTOMY AND ADENOIDECTOMY      There were no vitals filed for this  visit.  Subjective Assessment - 01/03/18 0853    Subjective  Patient reports he is feeling much better today then he did the last visit. He is having no back pain. He reports no leg pain at this time ether.     Pertinent History  Patient has dialysis 3 days a week; bilateral peripheral neuropathy     How long can you walk comfortably?  < 10 minutes     Currently in Pain?  No/denies                               PT Education - 01/03/18 1358    Education Details  updated HEp and reviewed exercises     Person(s) Educated  Patient    Methods  Explanation;Demonstration;Tactile cues;Verbal cues;Handout    Comprehension  Verbalized understanding;Returned demonstration;Verbal cues required;Tactile cues required       PT Short Term Goals - 01/03/18 1403      PT SHORT TERM GOAL #1   Title  pt will be I with inital HEP (06/06/2016)    Baseline  says he is doing    Time  4    Period  Weeks    Status  Achieved      PT SHORT TERM GOAL #2   Title  Patient will increase right hip flexion strength to 4/5     Time  4    Period  Weeks    Status  Unable to assess      PT SHORT TERM GOAL #3   Title  Patient will increase lumbar flexion by 25%     Baseline  working on with HEP    Time  4    Period  Weeks    Status  On-going        PT Long Term Goals - 12/18/17 1537      PT LONG TERM GOAL #1   Title  Patient will bend to pick item off the ground without significant pain in order to perfrom ADL's     Time  8    Period  Weeks    Status  New    Target Date  02/12/18      PT LONG TERM GOAL #2   Title  Patient will stand for 30 min without increased pain in order to perfrom ADL's     Time  8    Period  Weeks    Status  New    Target Date  02/12/18      PT LONG TERM GOAL #3   Title  Patient will improve FOTO score to 45% to demonstrate improved function     Time  8    Period  Weeks    Status  New  Target Date  02/12/18            Plan - 01/03/18  1400    Clinical Impression Statement  Patient tolerated treatment well. He had increased pain into his leg with bridges but tolerated his other exercises well. Therapy added in UE exercises and SLR.     Clinical Presentation  Evolving    Clinical Decision Making  Moderate    Rehab Potential  Good    PT Frequency  2x / week    PT Duration  8 weeks    PT Treatment/Interventions  ADLs/Self Care Home Management;Cryotherapy;Electrical Stimulation;Iontophoresis 4mg /ml Dexamethasone;Ultrasound;DME Instruction;Therapeutic exercise;Therapeutic activities;Gait training;Neuromuscular re-education;Patient/family education;Manual techniques;Dry needling    PT Next Visit Plan  assess modalities and continue if helpful,  Stretch QL, quads, hip flexor, hamstrings and calf,  progress HEP as able    PT Home Exercise Plan  Continue exercises from previous visit.     Consulted and Agree with Plan of Care  Patient       Patient will benefit from skilled therapeutic intervention in order to improve the following deficits and impairments:  Abnormal gait, Pain, Decreased endurance, Decreased range of motion, Decreased strength, Decreased activity tolerance, Increased muscle spasms, Difficulty walking  Visit Diagnosis: Chronic right-sided low back pain without sciatica  Muscle spasm of back  Abnormal posture  Muscle weakness (generalized)     Problem List Patient Active Problem List   Diagnosis Date Noted  . HNP (herniated nucleus pulposus), lumbar 07/03/2017  . Chest pain in adult 04/27/2017  . Restless leg syndrome, uncontrolled 04/27/2017  . 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 Anemia of chronic disease 03/17/2017  . Severe uncontrolled hypertension 03/17/2017  . ESRD (end stage renal disease) on dialysis ([place]) 03/17/2017  . Hyperlipidemia 03/17/2017  . Chronic chest pain 03/17/2017  . Type II diabetes mellitus ([place]) 03/17/2017  . Acute on chronic diastolic heart failure ([place]) 03/17/2017  .  Acute heart failure ([place]) 03/17/2017  . Lumbar radiculopathy 01/16/2017  . Chest pain, non-cardiac 10/09/2015  . Acute on chronic renal failure ([place]) 06/16/2014  . Renal failure (ARF), acute on chronic (HCC) 06/16/2014  . Shoulder pain, left 12/15/2013  . Chest pain 12/15/2013  . Claudication ([place]) 12/11/2013  . PVD (peripheral vascular disease) ([place]) 12/11/2013  . Peripheral arterial disease ([place]) 09/30/2013  . Carotid artery disease ([place]) 09/30/2013  . Acute chest pain 11/15/2012  . Left-sided chest wall pain 03/19/2012  . Bruit 09/15/2010  . CAD (coronary artery disease) nonobstructive per cath 2012   . Chronic kidney disease (CKD), stage IV (severe) ([place])   . Precordial chest pain 07/26/2009  . DM (diabetes mellitus) ([place]) 07/22/2009  . Hyperlipidemia LDL goal <70 07/22/2009  . Hypertension 07/22/2009    Carney Living PT DPT  01/03/2018, 2:10 PM  [facility] [address] [place], [place], [address] Phone: [phone]   Fax:  [phone]  Name: RAZI HICKLE MRN: [id number] Date of Birth: April 19, 1950

## 2018-01-08 ENCOUNTER — Encounter: Payer: Self-pay | Admitting: Physical Therapy

## 2018-01-08 ENCOUNTER — Ambulatory Visit: Payer: Medicare Other | Attending: Physician Assistant | Admitting: Physical Therapy

## 2018-01-08 DIAGNOSIS — R293 Abnormal posture: Secondary | ICD-10-CM

## 2018-01-08 DIAGNOSIS — M6281 Muscle weakness (generalized): Secondary | ICD-10-CM | POA: Diagnosis present

## 2018-01-08 DIAGNOSIS — M62830 Muscle spasm of back: Secondary | ICD-10-CM | POA: Insufficient documentation

## 2018-01-08 DIAGNOSIS — G8929 Other chronic pain: Secondary | ICD-10-CM | POA: Diagnosis present

## 2018-01-08 DIAGNOSIS — M545 Low back pain: Secondary | ICD-10-CM | POA: Insufficient documentation

## 2018-01-08 HISTORY — PX: BACK SURGERY: SHX140

## 2018-01-09 ENCOUNTER — Encounter: Payer: Self-pay | Admitting: Physical Therapy

## 2018-01-09 NOTE — Therapy (Signed)
[place], [place], [address] Phone: [phone]   Fax:  [phone]  Physical Therapy Treatment  Patient Details  Name: Jon Anderson MRN: [id number] Date of Birth: [birth year] Referring Provider (PT): Dr Ferne Reus    Encounter Date: 01/08/2018  PT End of Session - 01/08/18 1139    Visit Number  6    Number of Visits  16    Date for PT Re-Evaluation  02/12/18    Authorization Type  medicare triad     PT Start Time  0846    PT Stop Time  0948    PT Time Calculation (min)  62 min    Activity Tolerance  Patient tolerated treatment well    Behavior During Therapy  [facility] for tasks assessed/performed       Past Medical History:  Diagnosis Date  . Anemia of chronic disease   . CAD (coronary artery disease)    a.  Myoview 4/11: EF 53%, no scar or ischemia   c. MV 2012 Nl perfusion, apical thinning.  No ischemia or scar.  EF 49%, appears greater by visual estimate.;  d.  Dob stress echo 12/13:  Negative Dob stress echo. There is no evidence of ischemia.  The LVF is normal. b. Normal cors 2016.  . Carotid stenosis    a. <76% RICA, >16% LICA by duplex 0/7371  . Chronic chest pain    occ  . ESRD (end stage renal disease) on dialysis ([place])    M-W-F  . GERD (gastroesophageal reflux disease)   . HNP (herniated nucleus pulposus), lumbar   . HTN (hypertension)    echo 3/10: EF 60%, LAE  . Hyperlipidemia   . Nephrolithiasis    "passed them all"  . Peripheral arterial disease ([place])    a. s/p PTCA to L SFA.  Marland Kitchen Pneumonia   . Restless legs   . Sleep apnea    no cpap, needs to reschedule appointment to set up aquiring cpap  . Snores    a. presumed OSA, pt has refused sleep eval in past.  . Type II diabetes mellitus ([place])    no longer on medications, checks blood glucose at home    Past Surgical History:  Procedure Laterality Date  . ANGIOPLASTY / STENTING FEMORAL Left 12/11/2013   dr berry  . AV  FISTULA PLACEMENT Left 03/19/2014   Procedure: CREATION OF ARTERIOVENOUS (AV) FISTULA  LEFT UPPER ARM;  Surgeon: Mal Misty, MD;  Location: [place];  Service: Vascular;  Laterality: Left;  . CARDIAC CATHETERIZATION  2001 and 2010   . COLONOSCOPY W/ BIOPSIES AND POLYPECTOMY    . COLONOSCOPY WITH PROPOFOL N/A 08/01/2016   Procedure: COLONOSCOPY WITH PROPOFOL;  Surgeon: Carol Ada, MD;  [facility];  Service: Endoscopy;  Laterality: N/A;  . ESOPHAGOGASTRODUODENOSCOPY (EGD) WITH PROPOFOL N/A 08/01/2016   Procedure: ESOPHAGOGASTRODUODENOSCOPY (EGD) WITH PROPOFOL;  Surgeon: Carol Ada, MD;  [facility];  Service: Endoscopy;  Laterality: N/A;  . FOOT FRACTURE SURGERY Right   . FRACTURE SURGERY    . INGUINAL HERNIA REPAIR Left   . LEFT HEART CATH AND CORS/GRAFTS ANGIOGRAPHY N/A 04/27/2017   Procedure: LEFT HEART CATH AND CORS/GRAFTS ANGIOGRAPHY;  Surgeon: Leonie Man, MD;  Location: [place] CV LAB;  Service: Cardiovascular;  Laterality: N/A;  . LEFT HEART CATHETERIZATION WITH CORONARY ANGIOGRAM N/A 06/22/2014   Procedure: LEFT HEART CATHETERIZATION WITH CORONARY ANGIOGRAM;  Surgeon: Troy Sine, MD;  Location: [facility] CATH  LAB;  Service: Cardiovascular;  Laterality: N/A;  . LOWER EXTREMITY ANGIOGRAM Left 12/11/2013   Procedure: LOWER EXTREMITY ANGIOGRAM;  Surgeon: Lorretta Harp, MD;  Location: [facility] CATH LAB;  Service: Cardiovascular;  Laterality: Left;  . LUMBAR LAMINECTOMY/DECOMPRESSION MICRODISCECTOMY Right 07/03/2017   Procedure: MICRODISCECTOMY LUMBAR FIVE - SACRAL ONE RIGHT;  Surgeon: Consuella Lose, MD;  Location: [place];  Service: Neurosurgery;  Laterality: Right;  . LUMBAR LAMINECTOMY/DECOMPRESSION MICRODISCECTOMY Right 10/19/2017   Procedure: MICRODISCECTOMY LUMBAR FIVE- SACRAL 1 ONE ;  Surgeon: Consuella Lose, MD;  Location: [place];  Service: Neurosurgery;  Laterality: Right;  . TONSILLECTOMY AND ADENOIDECTOMY      There were no vitals filed for this  visit.  Subjective Assessment - 01/08/18 0853    Subjective  Patient reports he is still haing symptoms down legs which worsen after about 5 minutes of standing to wash dishes. He is having no LBP. Pt had MD visit for radicular symptoms the day prior and may have another MRI done.     Pertinent History  Patient has dialysis 3 days a week; bilateral peripheral neuropathy     Limitations  Standing    How long can you walk comfortably?  < 10 minutes     Patient Stated Goals  to be able to stand longer     Currently in Pain?  Yes    Pain Score  6     Pain Location  Leg    Pain Orientation  Right;Lower    Pain Descriptors / Indicators  Aching    Pain Onset  More than a month ago    Pain Frequency  Constant    Aggravating Factors   standing and pain at night     Pain Relieving Factors  sitting     Effect of Pain on Daily Activities  pain that is keeping him from sleeping                        [facility] Adult PT Treatment/Exercise - 01/09/18 0001      Exercises   Exercises  Lumbar      Lumbar Exercises: Stretches   Active Hamstring Stretch Limitations  seated 3x20 sec hold     Single Knee to Chest Stretch Limitations  3x20 sec hold     Lower Trunk Rotation Limitations  x10     Other Lumbar Stretch Exercise  glute stretch 2x30 sec hold       Lumbar Exercises: Aerobic   Nustep  5 min L3       Lumbar Exercises: Supine   Clam Limitations  2x10 yellow     Bent Knee Raise Limitations  2x10     Other Supine Lumbar Exercises  ball squeeze 2x10       Moist Heat Therapy   Number Minutes Moist Heat  15 Minutes    Moist Heat Location  Lumbar Spine      Electrical Stimulation   Electrical Stimulation Location  low back,  right    Electrical Stimulation Action  IFC    Electrical Stimulation Parameters  to tolerance     Electrical Stimulation Goals  Pain   sitting     Manual Therapy   Manual therapy comments  soft tissue mobilization and trigger point release to lumbar spine  in side lying              PT Education - 01/08/18 1138    Education Details  reviewed reasoing behind strengthening  Person(s) Educated  Patient    Methods  Explanation;Demonstration;Tactile cues;Verbal cues    Comprehension  Verbalized understanding;Returned demonstration;Verbal cues required;Tactile cues required       PT Short Term Goals - 01/03/18 1403      PT SHORT TERM GOAL #1   Title  pt will be I with inital HEP (06/06/2016)    Baseline  says he is doing    Time  4    Period  Weeks    Status  Achieved      PT SHORT TERM GOAL #2   Title  Patient will increase right hip flexion strength to 4/5     Time  4    Period  Weeks    Status  Unable to assess      PT SHORT TERM GOAL #3   Title  Patient will increase lumbar flexion by 25%     Baseline  working on with HEP    Time  4    Period  Weeks    Status  On-going        PT Long Term Goals - 12/18/17 1537      PT LONG TERM GOAL #1   Title  Patient will bend to pick item off the ground without significant pain in order to perfrom ADL's     Time  8    Period  Weeks    Status  New    Target Date  02/12/18      PT LONG TERM GOAL #2   Title  Patient will stand for 30 min without increased pain in order to perfrom ADL's     Time  8    Period  Weeks    Status  New    Target Date  02/12/18      PT LONG TERM GOAL #3   Title  Patient will improve FOTO score to 45% to demonstrate improved function     Time  8    Period  Weeks    Status  New    Target Date  02/12/18            Plan - 01/08/18 1139    Clinical Impression Statement  Patient tolerated exercises well depsite increased pain prior to session. Overall he is not making much progress with his standing time. he went back to the MD who feels he may need further surgery. he was encouraged to continue strengthening.     Clinical Presentation  Evolving    Clinical Decision Making  Moderate    Rehab Potential  Good    PT Frequency  2x / week     PT Duration  8 weeks    PT Treatment/Interventions  ADLs/Self Care Home Management;Cryotherapy;Electrical Stimulation;Iontophoresis 4mg /ml Dexamethasone;Ultrasound;DME Instruction;Therapeutic exercise;Therapeutic activities;Gait training;Neuromuscular re-education;Patient/family education;Manual techniques;Dry needling    PT Next Visit Plan  assess modalities and continue if helpful,  Stretch QL, quads, hip flexor, hamstrings and calf,  progress HEP as able    PT Home Exercise Plan  Continue exercises from previous visit.     Consulted and Agree with Plan of Care  Patient       Patient will benefit from skilled therapeutic intervention in order to improve the following deficits and impairments:  Abnormal gait, Pain, Decreased endurance, Decreased range of motion, Decreased strength, Decreased activity tolerance, Increased muscle spasms, Difficulty walking  Visit Diagnosis: Chronic right-sided low back pain without sciatica  Muscle spasm of back  Abnormal posture  Muscle weakness (generalized)  Problem List Patient Active Problem List   Diagnosis Date Noted  . HNP (herniated nucleus pulposus), lumbar 07/03/2017  . Chest pain in adult 04/27/2017  . Restless leg syndrome, uncontrolled 04/27/2017  . 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 Anemia of chronic disease 03/17/2017  . Severe uncontrolled hypertension 03/17/2017  . ESRD (end stage renal disease) on dialysis ([place]) 03/17/2017  . Hyperlipidemia 03/17/2017  . Chronic chest pain 03/17/2017  . Type II diabetes mellitus ([place]) 03/17/2017  . Acute on chronic diastolic heart failure ([place]) 03/17/2017  . Acute heart failure ([place]) 03/17/2017  . Lumbar radiculopathy 01/16/2017  . Chest pain, non-cardiac 10/09/2015  . Acute on chronic renal failure ([place]) 06/16/2014  . Renal failure (ARF), acute on chronic (HCC) 06/16/2014  . Shoulder pain, left 12/15/2013  . Chest pain 12/15/2013  . Claudication ([place]) 12/11/2013  . PVD  (peripheral vascular disease) ([place]) 12/11/2013  . Peripheral arterial disease ([place]) 09/30/2013  . Carotid artery disease ([place]) 09/30/2013  . Acute chest pain 11/15/2012  . Left-sided chest wall pain 03/19/2012  . Bruit 09/15/2010  . CAD (coronary artery disease) nonobstructive per cath 2012   . Chronic kidney disease (CKD), stage IV (severe) ([place])   . Precordial chest pain 07/26/2009  . DM (diabetes mellitus) ([place]) 07/22/2009  . Hyperlipidemia LDL goal <70 07/22/2009  . Hypertension 07/22/2009    Carney Living PT DPT  01/09/2018, 8:18 AM  [facility] [address] [place], [place], [address] Phone: [phone]   Fax:  [phone]  Name: Jon Anderson MRN: [id number] Date of Birth: 1950-08-15

## 2018-01-10 ENCOUNTER — Encounter: Payer: Self-pay | Admitting: Physical Therapy

## 2018-01-10 ENCOUNTER — Ambulatory Visit: Payer: Medicare Other | Admitting: Physical Therapy

## 2018-01-10 DIAGNOSIS — M62830 Muscle spasm of back: Secondary | ICD-10-CM

## 2018-01-10 DIAGNOSIS — M545 Low back pain: Secondary | ICD-10-CM | POA: Diagnosis not present

## 2018-01-10 NOTE — Therapy (Signed)
[place], [place], [address] Phone: [phone]   Fax:  [phone]  Physical Therapy Treatment  Patient Details  Name: KEMAURI MUSA MRN: [id number] Date of Birth: 02/20/1951 Referring Provider (PT): Dr Ferne Reus    Encounter Date: 01/10/2018  PT End of Session - 01/10/18 1525    Visit Number  7    Number of Visits  16    Date for PT Re-Evaluation  02/12/18    Authorization Type  medicare triad     PT Start Time  0847    PT Stop Time  0945    PT Time Calculation (min)  58 min    Activity Tolerance  Patient tolerated treatment well    Behavior During Therapy  [facility] for tasks assessed/performed       Past Medical History:  Diagnosis Date  . Anemia of chronic disease   . CAD (coronary artery disease)    a.  Myoview 4/11: EF 53%, no scar or ischemia   c. MV 2012 Nl perfusion, apical thinning.  No ischemia or scar.  EF 49%, appears greater by visual estimate.;  d.  Dob stress echo 12/13:  Negative Dob stress echo. There is no evidence of ischemia.  The LVF is normal. b. Normal cors 2016.  . Carotid stenosis    a. <49% RICA, >70% LICA by duplex 05/6376  . Chronic chest pain    occ  . ESRD (end stage renal disease) on dialysis ([place])    M-W-F  . GERD (gastroesophageal reflux disease)   . HNP (herniated nucleus pulposus), lumbar   . HTN (hypertension)    echo 3/10: EF 60%, LAE  . Hyperlipidemia   . Nephrolithiasis    "passed them all"  . Peripheral arterial disease ([place])    a. s/p PTCA to L SFA.  Marland Kitchen Pneumonia   . Restless legs   . Sleep apnea    no cpap, needs to reschedule appointment to set up aquiring cpap  . Snores    a. presumed OSA, pt has refused sleep eval in past.  . Type II diabetes mellitus ([place])    no longer on medications, checks blood glucose at home    Past Surgical History:  Procedure Laterality Date  . ANGIOPLASTY / STENTING FEMORAL Left 12/11/2013   dr berry  . AV  FISTULA PLACEMENT Left 03/19/2014   Procedure: CREATION OF ARTERIOVENOUS (AV) FISTULA  LEFT UPPER ARM;  Surgeon: Mal Misty, MD;  Location: [place];  Service: Vascular;  Laterality: Left;  . CARDIAC CATHETERIZATION  2001 and 2010   . COLONOSCOPY W/ BIOPSIES AND POLYPECTOMY    . COLONOSCOPY WITH PROPOFOL N/A 08/01/2016   Procedure: COLONOSCOPY WITH PROPOFOL;  Surgeon: Carol Ada, MD;  [facility];  Service: Endoscopy;  Laterality: N/A;  . ESOPHAGOGASTRODUODENOSCOPY (EGD) WITH PROPOFOL N/A 08/01/2016   Procedure: ESOPHAGOGASTRODUODENOSCOPY (EGD) WITH PROPOFOL;  Surgeon: Carol Ada, MD;  [facility];  Service: Endoscopy;  Laterality: N/A;  . FOOT FRACTURE SURGERY Right   . FRACTURE SURGERY    . INGUINAL HERNIA REPAIR Left   . LEFT HEART CATH AND CORS/GRAFTS ANGIOGRAPHY N/A 04/27/2017   Procedure: LEFT HEART CATH AND CORS/GRAFTS ANGIOGRAPHY;  Surgeon: Leonie Man, MD;  Location: [place] CV LAB;  Service: Cardiovascular;  Laterality: N/A;  . LEFT HEART CATHETERIZATION WITH CORONARY ANGIOGRAM N/A 06/22/2014   Procedure: LEFT HEART CATHETERIZATION WITH CORONARY ANGIOGRAM;  Surgeon: Troy Sine, MD;  Location: [facility] CATH  LAB;  Service: Cardiovascular;  Laterality: N/A;  . LOWER EXTREMITY ANGIOGRAM Left 12/11/2013   Procedure: LOWER EXTREMITY ANGIOGRAM;  Surgeon: Lorretta Harp, MD;  Location: [facility] CATH LAB;  Service: Cardiovascular;  Laterality: Left;  . LUMBAR LAMINECTOMY/DECOMPRESSION MICRODISCECTOMY Right 07/03/2017   Procedure: MICRODISCECTOMY LUMBAR FIVE - SACRAL ONE RIGHT;  Surgeon: Consuella Lose, MD;  Location: [place];  Service: Neurosurgery;  Laterality: Right;  . LUMBAR LAMINECTOMY/DECOMPRESSION MICRODISCECTOMY Right 10/19/2017   Procedure: MICRODISCECTOMY LUMBAR FIVE- SACRAL 1 ONE ;  Surgeon: Consuella Lose, MD;  Location: [place];  Service: Neurosurgery;  Laterality: Right;  . TONSILLECTOMY AND ADENOIDECTOMY      There were no vitals filed for this  visit.  Subjective Assessment - 01/10/18 0854    Subjective  Pt reported he had been having leg cramps during dialysis which continued to bother him after his treatment. Once home his leg cramps and pain on both sides of legs increased and his left leg "gave" out causing him to fall on his knees. He reports he did not get hurt but is concerned about the leg giving out without any warning. States the fall did not increase back pain and is mild today.     Pertinent History  Patient has dialysis 3 days a week; bilateral peripheral neuropathy     Limitations  Standing    How long can you walk comfortably?  < 10 minutes     Patient Stated Goals  to be able to stand longer     Currently in Pain?  Yes    Pain Score  7     Pain Location  Leg    Pain Orientation  Left    Pain Descriptors / Indicators  Aching;Cramping    Pain Type  Chronic pain    Pain Radiating Towards  down both sides of legs    Pain Onset  More than a month ago    Pain Frequency  Constant    Aggravating Factors   standing and pain at night; dialysis treatments     Pain Relieving Factors  sitting    Effect of Pain on Daily Activities  pain that is keeping him from sleeping    Multiple Pain Sites  Yes                       [facility] Adult PT Treatment/Exercise - 01/10/18 0001      Lumbar Exercises: Stretches   Active Hamstring Stretch Limitations  seated 3x20 sec hold     Single Knee to Chest Stretch Limitations  3x20 sec hold     Lower Trunk Rotation Limitations  x10     Other Lumbar Stretch Exercise  glute stretch 2x30 sec hold       Lumbar Exercises: Aerobic   Nustep  5 min      Lumbar Exercises: Supine   Clam Limitations  2x10 red     Other Supine Lumbar Exercises  reverse crunch 2x10 with stability ball; trunk rotations 2x20 with trunk rotations;       Moist Heat Therapy   Number Minutes Moist Heat  15 Minutes    Moist Heat Location  Lumbar Spine      Electrical Stimulation   Electrical Stimulation  Location  low back,  right    Electrical Stimulation Action  IFC     Electrical Stimulation Parameters  to tolerance     Electrical Stimulation Goals  Pain   sitting     Manual Therapy  Manual therapy comments  soft tissue mobilization and trigger point release to lumbar spine in side lying              PT Education - 01/10/18 1510    Education Details  Educated on stretches before and after dialysis to decrease calf cramps during treatments    Person(s) Educated  Patient    Methods  Explanation;Demonstration;Tactile cues;Verbal cues    Comprehension  Returned demonstration;Verbal cues required;Tactile cues required       PT Short Term Goals - 01/03/18 1403      PT SHORT TERM GOAL #1   Title  pt will be I with inital HEP (06/06/2016)    Baseline  says he is doing    Time  4    Period  Weeks    Status  Achieved      PT SHORT TERM GOAL #2   Title  Patient will increase right hip flexion strength to 4/5     Time  4    Period  Weeks    Status  Unable to assess      PT SHORT TERM GOAL #3   Title  Patient will increase lumbar flexion by 25%     Baseline  working on with HEP    Time  4    Period  Weeks    Status  On-going        PT Long Term Goals - 12/18/17 1537      PT LONG TERM GOAL #1   Title  Patient will bend to pick item off the ground without significant pain in order to perfrom ADL's     Time  8    Period  Weeks    Status  New    Target Date  02/12/18      PT LONG TERM GOAL #2   Title  Patient will stand for 30 min without increased pain in order to perfrom ADL's     Time  8    Period  Weeks    Status  New    Target Date  02/12/18      PT LONG TERM GOAL #3   Title  Patient will improve FOTO score to 45% to demonstrate improved function     Time  8    Period  Weeks    Status  New    Target Date  02/12/18            Plan - 01/10/18 1527    Clinical Impression Statement  Patient tolerated treatment well, performing exercises without any  increase in back pain. Pt did report minor cramps in bilateral calf muscles throughout which ceased with STM, calf stretches, and/or pt changing position. Continued with core stabilization in supine and sitting with decreased cues required for transverse abdominus activation.  Soft tissue mobilization performed on bilateral calf muscles and paraspinal in low back.  Did not progress to standing exercises at todays visit as he reported a fall the day prior secondary to his left LE "giving away" suddenly.  Pt stated he was going to try and scheduele an appointment with MD to discuss his leg giving away.     History and Personal Factors relevant to plan of care:  periphral neuropathy; DM2    Clinical Presentation  Evolving    Clinical Presentation due to:  pain that increases with function    Clinical Decision Making  Moderate    Rehab Potential  Good    PT Frequency  2x / week    PT Duration  8 weeks    PT Treatment/Interventions  ADLs/Self Care Home Management;Cryotherapy;Electrical Stimulation;Iontophoresis 4mg /ml Dexamethasone;Ultrasound;DME Instruction;Therapeutic exercise;Therapeutic activities;Gait training;Neuromuscular re-education;Patient/family education;Manual techniques;Dry needling    PT Next Visit Plan  assess modalities and continue if helpful,  Stretch QL, quads, hip flexor, hamstrings and calf, core strengthening, STM in sidelying    PT Home Exercise Plan  Continue exercises from previous visit.     Consulted and Agree with Plan of Care  Patient       Patient will benefit from skilled therapeutic intervention in order to improve the following deficits and impairments:  Abnormal gait, Pain, Decreased endurance, Decreased range of motion, Decreased strength, Decreased activity tolerance, Increased muscle spasms, Difficulty walking  Visit Diagnosis: Muscle spasm of back     Problem List Patient Active Problem List   Diagnosis Date Noted  . HNP (herniated nucleus pulposus), lumbar  07/03/2017  . Chest pain in adult 04/27/2017  . Restless leg syndrome, uncontrolled 04/27/2017  . 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 Anemia of chronic disease 03/17/2017  . Severe uncontrolled hypertension 03/17/2017  . ESRD (end stage renal disease) on dialysis ([place]) 03/17/2017  . Hyperlipidemia 03/17/2017  . Chronic chest pain 03/17/2017  . Type II diabetes mellitus ([place]) 03/17/2017  . Acute on chronic diastolic heart failure ([place]) 03/17/2017  . Acute heart failure ([place]) 03/17/2017  . Lumbar radiculopathy 01/16/2017  . Chest pain, non-cardiac 10/09/2015  . Acute on chronic renal failure ([place]) 06/16/2014  . Renal failure (ARF), acute on chronic (HCC) 06/16/2014  . Shoulder pain, left 12/15/2013  . Chest pain 12/15/2013  . Claudication ([place]) 12/11/2013  . PVD (peripheral vascular disease) ([place]) 12/11/2013  . Peripheral arterial disease ([place]) 09/30/2013  . Carotid artery disease ([place]) 09/30/2013  . Acute chest pain 11/15/2012  . Left-sided chest wall pain 03/19/2012  . Bruit 09/15/2010  . CAD (coronary artery disease) nonobstructive per cath 2012   . Chronic kidney disease (CKD), stage IV (severe) ([place])   . Precordial chest pain 07/26/2009  . DM (diabetes mellitus) ([place]) 07/22/2009  . Hyperlipidemia LDL goal <70 07/22/2009  . Hypertension 07/22/2009    Carney Living DPT  01/10/2018, 8:25 PM  Einar Crow SPT  01/10/2018   [facility] [address] [place], [place], [address] Phone: [phone]   Fax:  [phone]  Name: AZEKIEL CREMER MRN: [id number] Date of Birth: 1950/08/29

## 2018-01-11 ENCOUNTER — Other Ambulatory Visit: Payer: Self-pay | Admitting: Physician Assistant

## 2018-01-11 DIAGNOSIS — M5126 Other intervertebral disc displacement, lumbar region: Secondary | ICD-10-CM

## 2018-01-13 ENCOUNTER — Ambulatory Visit
Admission: RE | Admit: 2018-01-13 | Discharge: 2018-01-13 | Disposition: A | Discharge status: Home / Self Care | Payer: Medicare Other | Source: Ambulatory Visit | Attending: Physician Assistant | Admitting: Physician Assistant

## 2018-01-13 DIAGNOSIS — M5126 Other intervertebral disc displacement, lumbar region: Secondary | ICD-10-CM

## 2018-01-15 ENCOUNTER — Ambulatory Visit: Payer: Medicare Other | Admitting: Physical Therapy

## 2018-01-15 DIAGNOSIS — M545 Low back pain: Secondary | ICD-10-CM

## 2018-01-15 DIAGNOSIS — G8929 Other chronic pain: Secondary | ICD-10-CM

## 2018-01-15 DIAGNOSIS — R293 Abnormal posture: Secondary | ICD-10-CM

## 2018-01-15 DIAGNOSIS — M6281 Muscle weakness (generalized): Secondary | ICD-10-CM

## 2018-01-15 DIAGNOSIS — M62830 Muscle spasm of back: Secondary | ICD-10-CM

## 2018-01-15 NOTE — Patient Instructions (Signed)

## 2018-01-15 NOTE — Therapy (Signed)
[place], [place], [address] Phone: [phone]   Fax:  [phone]  Physical Therapy Treatment  Patient Details  Name: Jon Anderson MRN: [id number] Date of Birth: 1951-01-30 Referring Provider (PT): Dr Ferne Reus    Encounter Date: 01/15/2018  PT End of Session - 01/15/18 1858    Visit Number  8    Number of Visits  16    Date for PT Re-Evaluation  02/12/18    PT Start Time  0848    PT Stop Time  0945    PT Time Calculation (min)  57 min    Activity Tolerance  Patient tolerated treatment well    Behavior During Therapy  [facility] for tasks assessed/performed       Past Medical History:  Diagnosis Date  . Anemia of chronic disease   . CAD (coronary artery disease)    a.  Myoview 4/11: EF 53%, no scar or ischemia   c. MV 2012 Nl perfusion, apical thinning.  No ischemia or scar.  EF 49%, appears greater by visual estimate.;  d.  Dob stress echo 12/13:  Negative Dob stress echo. There is no evidence of ischemia.  The LVF is normal. b. Normal cors 2016.  . Carotid stenosis    a. <45% RICA, >80% LICA by duplex 12/9831  . Chronic chest pain    occ  . ESRD (end stage renal disease) on dialysis ([place])    M-W-F  . GERD (gastroesophageal reflux disease)   . HNP (herniated nucleus pulposus), lumbar   . HTN (hypertension)    echo 3/10: EF 60%, LAE  . Hyperlipidemia   . Nephrolithiasis    "passed them all"  . Peripheral arterial disease ([place])    a. s/p PTCA to L SFA.  Marland Kitchen Pneumonia   . Restless legs   . Sleep apnea    no cpap, needs to reschedule appointment to set up aquiring cpap  . Snores    a. presumed OSA, pt has refused sleep eval in past.  . Type II diabetes mellitus ([place])    no longer on medications, checks blood glucose at home    Past Surgical History:  Procedure Laterality Date  . ANGIOPLASTY / STENTING FEMORAL Left 12/11/2013   dr berry  . AV FISTULA PLACEMENT Left 03/19/2014   Procedure: CREATION OF ARTERIOVENOUS (AV) FISTULA  LEFT UPPER ARM;  Surgeon: Mal Misty, MD;  Location: [place];  Service: Vascular;  Laterality: Left;  . CARDIAC CATHETERIZATION  2001 and 2010   . COLONOSCOPY W/ BIOPSIES AND POLYPECTOMY    . COLONOSCOPY WITH PROPOFOL N/A 08/01/2016   Procedure: COLONOSCOPY WITH PROPOFOL;  Surgeon: Carol Ada, MD;  [facility];  Service: Endoscopy;  Laterality: N/A;  . ESOPHAGOGASTRODUODENOSCOPY (EGD) WITH PROPOFOL N/A 08/01/2016   Procedure: ESOPHAGOGASTRODUODENOSCOPY (EGD) WITH PROPOFOL;  Surgeon: Carol Ada, MD;  [facility];  Service: Endoscopy;  Laterality: N/A;  . FOOT FRACTURE SURGERY Right   . FRACTURE SURGERY    . INGUINAL HERNIA REPAIR Left   . LEFT HEART CATH AND CORS/GRAFTS ANGIOGRAPHY N/A 04/27/2017   Procedure: LEFT HEART CATH AND CORS/GRAFTS ANGIOGRAPHY;  Surgeon: Leonie Man, MD;  Location: [place] [facility];  Service: Cardiovascular;  Laterality: N/A;  . LEFT HEART CATHETERIZATION WITH CORONARY ANGIOGRAM N/A 06/22/2014   Procedure: LEFT HEART CATHETERIZATION WITH CORONARY ANGIOGRAM;  Surgeon: Troy Sine, MD;  Location: [facility] CATH LAB;  Service: Cardiovascular;  Laterality: N/A;  . LOWER  EXTREMITY ANGIOGRAM Left 12/11/2013   Procedure: LOWER EXTREMITY ANGIOGRAM;  Surgeon: Lorretta Harp, MD;  [facility] CATH LAB;  Service: Cardiovascular;  Laterality: Left;  . LUMBAR LAMINECTOMY/DECOMPRESSION MICRODISCECTOMY Right 07/03/2017   Procedure: MICRODISCECTOMY LUMBAR FIVE - SACRAL ONE RIGHT;  Surgeon: Consuella Lose, MD;  Location: [place];  Service: Neurosurgery;  Laterality: Right;  . LUMBAR LAMINECTOMY/DECOMPRESSION MICRODISCECTOMY Right 10/19/2017   Procedure: MICRODISCECTOMY LUMBAR FIVE- SACRAL 1 ONE ;  Surgeon: Consuella Lose, MD;  Location: [place];  Service: Neurosurgery;  Laterality: Right;  . TONSILLECTOMY AND ADENOIDECTOMY      There were no vitals filed for this visit.  Subjective Assessment - 01/15/18  0913    Subjective  Pain up to 8/10.    He finds out tomorrow the results  from study.    Currently in Pain?  Yes    Pain Score  7     Pain Location  Leg    Pain Orientation  Left;Posterior    Pain Descriptors / Indicators  Aching;Cramping    Pain Type  Chronic pain    Pain Radiating Towards  both legs    Pain Frequency  Constant    Aggravating Factors   post dyalisis treatment,  standing,  laying on one side    Pain Relieving Factors  sitting                       [facility] Adult PT Treatment/Exercise - 01/15/18 0001      Self-Care   Self-Care  ADL's    ADL's  handout reviewed.  demo correctly for patient.  Suggested modifications ie getting a reacher ,  energy conservation        Lumbar Exercises: Stretches   Passive Hamstring Stretch  2 reps;20 seconds    Lower Trunk Rotation Limitations  10    Other Lumbar Stretch Exercise  right quarratus lumborum stretches standing    Other Lumbar Stretch Exercise  Mckenzie lateral shift increased pain      Lumbar Exercises: Supine   Clam Limitations  2x10 red     Bent Knee Raise Limitations  2x10       Moist Heat Therapy   Number Minutes Moist Heat  15 Minutes    Moist Heat Location  Lumbar Spine   extra layer            PT Education - 01/15/18 1855    Education Details  ADL     Person(s) Educated  Patient    Methods  Explanation;Demonstration;Verbal cues;Handout    Comprehension  Verbalized understanding       PT Short Term Goals - 01/15/18 1902      PT SHORT TERM GOAL #1   Title  pt will be I with inital HEP (06/06/2016)    Baseline  says he is doing    Time  4    Period  Weeks    Status  Achieved      PT SHORT TERM GOAL #2   Title  Patient will increase right hip flexion strength to 4/5     Time  4    Period  Weeks    Status  Unable to assess      PT SHORT TERM GOAL #3   Title  Patient will increase lumbar flexion by 25%     Time  4    Period  Weeks    Status  Unable to assess        PT  Long Term Goals - 12/18/17 1537      PT LONG TERM GOAL #1   Title  Patient will bend to pick item off the ground without significant pain in order to perfrom ADL's     Time  8    Period  Weeks    Status  New    Target Date  02/12/18      PT LONG TERM GOAL #2   Title  Patient will stand for 30 min without increased pain in order to perfrom ADL's     Time  8    Period  Weeks    Status  New    Target Date  02/12/18      PT LONG TERM GOAL #3   Title  Patient will improve FOTO score to 45% to demonstrate improved function     Time  8    Period  Weeks    Status  New    Target Date  02/12/18            Plan - 01/15/18 1859    Clinical Impression Statement  Patient continues to tolerate exercises well with tingling notes in lower legs.  This was addresses with change of position.  Patient was able to decrease pain with bed mobility after education / practice of log roll.       PT Next Visit Plan  assess modalities and continue if helpful,  Stretch QL, quads, hip flexor, hamstrings and calf, core strengthening, STM in sidelying.  review log roll as needed . See how MD visit went.      PT Home Exercise Plan  Continue exercises from previous visit.     Consulted and Agree with Plan of Care  Patient       Patient will benefit from skilled therapeutic intervention in order to improve the following deficits and impairments:     Visit Diagnosis: Muscle spasm of back  Chronic right-sided low back pain without sciatica  Abnormal posture  Muscle weakness (generalized)     Problem List Patient Active Problem List   Diagnosis Date Noted  . HNP (herniated nucleus pulposus), lumbar 07/03/2017  . Chest pain in adult 04/27/2017  . Restless leg syndrome, uncontrolled 04/27/2017  . 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 Anemia of chronic disease 03/17/2017  . Severe uncontrolled hypertension 03/17/2017  . ESRD (end stage renal disease) on dialysis ([place]) 03/17/2017  .  Hyperlipidemia 03/17/2017  . Chronic chest pain 03/17/2017  . Type II diabetes mellitus ([place]) 03/17/2017  . Acute on chronic diastolic heart failure ([place]) 03/17/2017  . Acute heart failure ([place]) 03/17/2017  . Lumbar radiculopathy 01/16/2017  . Chest pain, non-cardiac 10/09/2015  . Acute on chronic renal failure ([place]) 06/16/2014  . Renal failure (ARF), acute on chronic (HCC) 06/16/2014  . Shoulder pain, left 12/15/2013  . Chest pain 12/15/2013  . Claudication ([place]) 12/11/2013  . PVD (peripheral vascular disease) ([place]) 12/11/2013  . Peripheral arterial disease ([place]) 09/30/2013  . Carotid artery disease ([place]) 09/30/2013  . Acute chest pain 11/15/2012  . Left-sided chest wall pain 03/19/2012  . Bruit 09/15/2010  . CAD (coronary artery disease) nonobstructive per cath 2012   . Chronic kidney disease (CKD), stage IV (severe) ([place])   . Precordial chest pain 07/26/2009  . DM (diabetes mellitus) ([place]) 07/22/2009  . Hyperlipidemia LDL goal <70 07/22/2009  . Hypertension 07/22/2009    Laurenashley Viar PTA 01/15/2018, 7:05 PM  [place]  [place], [place], [address] Phone: [phone]   Fax:  [phone]  Name: Jon Anderson MRN: [id number] Date of Birth: 1950-09-22

## 2018-01-16 ENCOUNTER — Other Ambulatory Visit: Payer: Self-pay | Admitting: Physician Assistant

## 2018-01-17 ENCOUNTER — Ambulatory Visit: Payer: Medicare Other | Admitting: Physical Therapy

## 2018-01-17 ENCOUNTER — Encounter: Payer: Self-pay | Admitting: Physical Therapy

## 2018-01-17 DIAGNOSIS — M62830 Muscle spasm of back: Secondary | ICD-10-CM

## 2018-01-17 DIAGNOSIS — M545 Low back pain: Secondary | ICD-10-CM | POA: Diagnosis not present

## 2018-01-17 DIAGNOSIS — G8929 Other chronic pain: Secondary | ICD-10-CM

## 2018-01-17 DIAGNOSIS — M6281 Muscle weakness (generalized): Secondary | ICD-10-CM

## 2018-01-17 DIAGNOSIS — R293 Abnormal posture: Secondary | ICD-10-CM

## 2018-01-17 NOTE — Therapy (Signed)
[place], [place], [address] Phone: [phone]   Fax:  [phone]  Physical Therapy Treatment & Discharge   Patient Details  Name: Jon Anderson MRN: [id number] Date of Birth: Jan 19, 1951 Referring Provider (PT): Dr Ferne Reus    Encounter Date: 01/17/2018  PT End of Session - 01/17/18 1511    Visit Number  9    Number of Visits  16    Date for PT Re-Evaluation  02/12/18    Authorization Type  medicare triad     PT Start Time  0846    PT Stop Time  0945    PT Time Calculation (min)  59 min    Activity Tolerance  Patient tolerated treatment well    Behavior During Therapy  [facility] for tasks assessed/performed       Past Medical History:  Diagnosis Date  . Anemia of chronic disease   . CAD (coronary artery disease)    a.  Myoview 4/11: EF 53%, no scar or ischemia   c. MV 2012 Nl perfusion, apical thinning.  No ischemia or scar.  EF 49%, appears greater by visual estimate.;  d.  Dob stress echo 12/13:  Negative Dob stress echo. There is no evidence of ischemia.  The LVF is normal. b. Normal cors 2016.  . Carotid stenosis    a. <62% RICA, >69% LICA by duplex 07/8544  . Chronic chest pain    occ  . ESRD (end stage renal disease) on dialysis ([place])    M-W-F  . GERD (gastroesophageal reflux disease)   . HNP (herniated nucleus pulposus), lumbar   . HTN (hypertension)    echo 3/10: EF 60%, LAE  . Hyperlipidemia   . Nephrolithiasis    "passed them all"  . Peripheral arterial disease ([place])    a. s/p PTCA to L SFA.  Marland Kitchen Pneumonia   . Restless legs   . Sleep apnea    no cpap, needs to reschedule appointment to set up aquiring cpap  . Snores    a. presumed OSA, pt has refused sleep eval in past.  . Type II diabetes mellitus ([place])    no longer on medications, checks blood glucose at home    Past Surgical History:  Procedure Laterality Date  . ANGIOPLASTY / STENTING FEMORAL Left 12/11/2013   dr  berry  . AV FISTULA PLACEMENT Left 03/19/2014   Procedure: CREATION OF ARTERIOVENOUS (AV) FISTULA  LEFT UPPER ARM;  Surgeon: Mal Misty, MD;  Location: [place];  Service: Vascular;  Laterality: Left;  . CARDIAC CATHETERIZATION  2001 and 2010   . COLONOSCOPY W/ BIOPSIES AND POLYPECTOMY    . COLONOSCOPY WITH PROPOFOL N/A 08/01/2016   Procedure: COLONOSCOPY WITH PROPOFOL;  Surgeon: Carol Ada, MD;  [facility];  Service: Endoscopy;  Laterality: N/A;  . ESOPHAGOGASTRODUODENOSCOPY (EGD) WITH PROPOFOL N/A 08/01/2016   Procedure: ESOPHAGOGASTRODUODENOSCOPY (EGD) WITH PROPOFOL;  Surgeon: Carol Ada, MD;  [facility];  Service: Endoscopy;  Laterality: N/A;  . FOOT FRACTURE SURGERY Right   . FRACTURE SURGERY    . INGUINAL HERNIA REPAIR Left   . LEFT HEART CATH AND CORS/GRAFTS ANGIOGRAPHY N/A 04/27/2017   Procedure: LEFT HEART CATH AND CORS/GRAFTS ANGIOGRAPHY;  Surgeon: Leonie Man, MD;  Location: [place] CV LAB;  Service: Cardiovascular;  Laterality: N/A;  . LEFT HEART CATHETERIZATION WITH CORONARY ANGIOGRAM N/A 06/22/2014   Procedure: LEFT HEART CATHETERIZATION WITH CORONARY ANGIOGRAM;  Surgeon: Troy Sine, MD;  Location: [place] CATH LAB;  Service: Cardiovascular;  Laterality: N/A;  . LOWER EXTREMITY ANGIOGRAM Left 12/11/2013   Procedure: LOWER EXTREMITY ANGIOGRAM;  Surgeon: Lorretta Harp, MD;  Location: [facility] CATH LAB;  Service: Cardiovascular;  Laterality: Left;  . LUMBAR LAMINECTOMY/DECOMPRESSION MICRODISCECTOMY Right 07/03/2017   Procedure: MICRODISCECTOMY LUMBAR FIVE - SACRAL ONE RIGHT;  Surgeon: Consuella Lose, MD;  Location: [place];  Service: Neurosurgery;  Laterality: Right;  . LUMBAR LAMINECTOMY/DECOMPRESSION MICRODISCECTOMY Right 10/19/2017   Procedure: MICRODISCECTOMY LUMBAR FIVE- SACRAL 1 ONE ;  Surgeon: Consuella Lose, MD;  Location: [place];  Service: Neurosurgery;  Laterality: Right;  . TONSILLECTOMY AND ADENOIDECTOMY      There were no vitals filed  for this visit.  Subjective Assessment - 01/17/18 1414    Subjective  Pt reported he discussed pain and leg sx with MD who advised pt to have another lumbar surgery. Pt does not want a third spinal surgery and may go for a second opinion. Pt reports he still has mild pain in low back throughout day after a lot of activity but his biggest concern is the weakness in right leg he often gets when back pain worsens.  Pt denies falling in the past week.     Pertinent History  Patient has dialysis 3 days a week; bilateral peripheral neuropathy     Limitations  Standing    How long can you walk comfortably?  < 10 minutes     Patient Stated Goals  to be able to stand longer     Currently in Pain?  No/denies    Pain Score  0-No pain    Pain Radiating Towards  both legs    Pain Onset  More than a month ago    Pain Frequency  Constant    Aggravating Factors   post dyalisis treatment, standing, lying on one side    Pain Relieving Factors  sitting         [facility] PT Assessment - 01/17/18 0001      AROM   Lumbar Flexion  remains limited 75% by pain in back   Lumbar Extension  no pain with 10 deg extension    Lumbar - Right Rotation  limited 25% with pain in back    Lumbar - Left Rotation  limited 25% with pain in left lateral thigh      Strength   Right Hip Flexion  4-/5    Right Hip ABduction  4+/5    Right Hip ADduction  5/5    Left Hip Flexion  4/5    Left Hip ABduction  4/5    Left Hip ADduction  5/5    Right Knee Extension  5/5    Left Knee Flexion  4+/5    Left Knee Extension  5/5                   [facility] Adult PT Treatment/Exercise - 01/18/18 0001      Lumbar Exercises: Stretches   Lower Trunk Rotation Limitations  10    Other Lumbar Stretch Exercise  piriformis stretch 2x20 sec; single knee to chest 2x20 sec      Lumbar Exercises: Aerobic   Nustep  5 min      Lumbar Exercises: Seated   Other Seated Lumbar Exercises  seated clams 2x10; seated ball squeeze 2x10;  seated alt. arm flexion with opposite hip flexion 1x15       Lumbar Exercises: Supine   Other Supine Lumbar Exercises  supine march  1x10; supine straight leg 1x10      Electrical Stimulation   Electrical Stimulation Location  low back,  right    Electrical Stimulation Goals  Pain      Manual Therapy   Manual therapy comments  soft tissue mobilization and trigger point release to lumbar spine in side lying              PT Education - 01/17/18 1500    Education Details  Reviewed HEP and importance of cont. with stretching routine for symptom management    Person(s) Educated  Patient    Methods  Explanation;Demonstration;Verbal cues    Comprehension  Verbalized understanding;Returned demonstration       PT Short Term Goals - 01/17/18 1535      PT SHORT TERM GOAL #1   Title  pt will be I with inital HEP (06/06/2016)    Baseline  says he is doing    Time  4    Period  Weeks    Status  Achieved      PT SHORT TERM GOAL #2   Title  Patient will increase right hip flexion strength to 4/5     Time  4    Period  Weeks    Status  Partially Met      PT SHORT TERM GOAL #3   Title  Patient will increase lumbar flexion by 25%     Time  4    Period  Weeks    Status  Not Met        PT Long Term Goals - 01/17/18 1537      PT LONG TERM GOAL #1   Title  Patient will bend to pick item off the ground without significant pain in order to perfrom ADL's     Time  8    Period  Weeks    Status  Not Met    Target Date  02/12/18      PT LONG TERM GOAL #2   Title  Patient will stand for 30 min without increased pain in order to perfrom ADL's     Time  8    Period  Weeks    Status  Partially Met    Target Date  02/12/18      PT LONG TERM GOAL #3   Title  Patient will improve FOTO score to 45% to demonstrate improved function     Baseline  46% at d/c     Time  8    Period  Weeks    Status  Partially Met    Target Date  02/12/18            Plan - 01/17/18 1513     Clinical Impression Statement  Patient tolerated treatment well without pain in low back or symptoms in legs with exercises. Continued with core stabilization and stretching program. Pt demonstrated improvements in gross overall bil hip and knee strength and lumbar AROM, except lumbar flexion. Pt has decreased tightness in lumbar paraspinals and gluts with no reports of pain upon palpation. Pt continues to report intermittent R leg weakness expereinced with walking activities and is fearful of falls as a result. Pt may benefit from further imaging of the right hip.     History and Personal Factors relevant to plan of care:  peripheral neuropathy; DM2    Clinical Presentation  Evolving    Clinical Presentation due to:  pain that increases with function    Clinical Decision Making  Moderate    Rehab Potential  Good    PT Frequency  2x / week    PT Duration  8 weeks    PT Treatment/Interventions  ADLs/Self Care Home Management;Cryotherapy;Electrical Stimulation;Iontophoresis 68m/ml Dexamethasone;Ultrasound;DME Instruction;Therapeutic exercise;Therapeutic activities;Gait training;Neuromuscular re-education;Patient/family education;Manual techniques;Dry needling    PT Home Exercise Plan  Continue exercises from previous visit.     Consulted and Agree with Plan of Care  Patient       Patient will benefit from skilled therapeutic intervention in order to improve the following deficits and impairments:  Abnormal gait, Pain, Decreased endurance, Decreased range of motion, Decreased strength, Decreased activity tolerance, Increased muscle spasms, Difficulty walking  Visit Diagnosis: Chronic right-sided low back pain without sciatica  Muscle spasm of back  Abnormal posture  Muscle weakness (generalized)  PHYSICAL THERAPY DISCHARGE SUMMARY  Visits from Start of Care: 9  Current functional level related to goals / functional outcomes: Improved strength; Improved hip and lumbar motion excepts  flexion;    Remaining deficits: Continues to have pain when standing for short periods of time    Education / Equipment: HEP   Plan: Patient agrees to discharge.  Patient goals were partially met. Patient is being discharged due to lack of progress.  ?????       Problem List Patient Active Problem List   Diagnosis Date Noted  . HNP (herniated nucleus pulposus), lumbar 07/03/2017  . Chest pain in adult 04/27/2017  . Restless leg syndrome, uncontrolled 04/27/2017  . 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 Anemia of chronic disease 03/17/2017  . Severe uncontrolled hypertension 03/17/2017  . ESRD (end stage renal disease) on dialysis ([place] 03/17/2017  . Hyperlipidemia 03/17/2017  . Chronic chest pain 03/17/2017  . Type II diabetes mellitus ([facility] 03/17/2017  . Acute on chronic diastolic heart failure ([facility] 03/17/2017  . Acute heart failure ([facility] 03/17/2017  . Lumbar radiculopathy 01/16/2017  . Chest pain, non-cardiac 10/09/2015  . Acute on chronic renal failure ([place] 06/16/2014  . Renal failure (ARF), acute on chronic (HCC) 06/16/2014  . Shoulder pain, left 12/15/2013  . Chest pain 12/15/2013  . Claudication ([place] 12/11/2013  . PVD (peripheral vascular disease) ([facility] 12/11/2013  . Peripheral arterial disease ([facility] 09/30/2013  . Carotid artery disease ([facility] 09/30/2013  . Acute chest pain 11/15/2012  . Left-sided chest wall pain 03/19/2012  . Bruit 09/15/2010  . CAD (coronary artery disease) nonobstructive per cath 2012   . Chronic kidney disease (CKD), stage IV (severe) ([facility]   . Precordial chest pain 07/26/2009  . DM (diabetes mellitus) ([facility] [place] 07/22/2009  . Hyperlipidemia LDL goal <70 07/22/2009  . Hypertension 07/22/2009    DCarney Living PT DPT  01/18/2018, 7:57 AM  KEinar CrowSPT  01/18/2018   During this treatment session, the therapist was present, participating in and directing the treatment. [place] [address]Phone: [phone]  Fax:  [phone] Name: RGURJIT LOCONTEMRN: [id number] of Birth: [birth year]

## 2018-01-23 ENCOUNTER — Ambulatory Visit (INDEPENDENT_AMBULATORY_CARE_PROVIDER_SITE_OTHER): Payer: Medicare Other | Admitting: Orthopaedic Surgery

## 2018-01-23 DIAGNOSIS — M5126 Other intervertebral disc displacement, lumbar region: Secondary | ICD-10-CM | POA: Diagnosis not present

## 2018-01-23 DIAGNOSIS — I6522 Occlusion and stenosis of left carotid artery: Secondary | ICD-10-CM | POA: Diagnosis not present

## 2018-01-23 DIAGNOSIS — M5416 Radiculopathy, lumbar region: Secondary | ICD-10-CM

## 2018-01-23 NOTE — Progress Notes (Signed)
Office Visit Note   Patient: Jon Anderson           Date of Birth: 04-19-1950           MRN: [id number] Visit Date: 01/23/2018              Requested by: [name], MD [id number], [place] [address] PCP: [name], MD   Assessment & Plan: Visit Diagnoses:  1. Lumbar radiculopathy   2. Lumbar disc herniation     Plan: I reviewed his most recent MRI and after discussion with the patient it sounds like he has failed conservative treatment and he has continued pain that severely limits him.  I recommend that he schedule surgery with Dr. Kathyrn Sheriff.  Follow-up as needed.  Follow-Up Instructions: Return if symptoms worsen or fail to improve.   Orders:  No orders of the defined types were placed in this encounter.  No orders of the defined types were placed in this encounter.     Procedures: No procedures performed   Clinical Data: No additional findings.   Subjective: Chief Complaint  Patient presents with  . Lower Back - Pain    Jon Anderson is here for a second opinion regarding his back.  He has had surgeries to Dr. Kathyrn Sheriff.  He states that his pain is localized to the right lower extremity.  He had an MRI last week.   Review of Systems  Constitutional: Negative.   All other systems reviewed and are negative.    Objective: Vital Signs: There were no vitals taken for this visit.  Physical Exam  Constitutional: He is oriented to person, place, and time. He appears well-developed and well-nourished.  Pulmonary/Chest: Effort normal.  Abdominal: Soft.  Neurological: He is alert and oriented to person, place, and time.  Skin: Skin is warm.  Psychiatric: He has a normal mood and affect. His behavior is normal. Judgment and thought content normal.  Nursing note and vitals reviewed.   Ortho Exam Right lower extremity exam shows no focal motor or sensory deficits.  Normal reflexes.  He does have some mild subjective paresthesias in the L5-S1  distribution. Specialty Comments:  No specialty comments available.  Imaging: No results found.   PMFS History: Patient Active Problem List   Diagnosis Date Noted  . HNP (herniated nucleus pulposus), lumbar 07/03/2017  . Chest pain in adult 04/27/2017  . Restless leg syndrome, uncontrolled 04/27/2017  . 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 Anemia of chronic disease 03/17/2017  . Severe uncontrolled hypertension 03/17/2017  . ESRD (end stage renal disease) on dialysis ([place]) 03/17/2017  . Hyperlipidemia 03/17/2017  . Chronic chest pain 03/17/2017  . Type II diabetes mellitus ([place]) 03/17/2017  . Acute on chronic diastolic heart failure ([place]) 03/17/2017  . Acute heart failure ([place]) 03/17/2017  . Lumbar radiculopathy 01/16/2017  . Chest pain, non-cardiac 10/09/2015  . Acute on chronic renal failure ([place]) 06/16/2014  . Renal failure (ARF), acute on chronic (HCC) 06/16/2014  . Shoulder pain, left 12/15/2013  . Chest pain 12/15/2013  . Claudication ([place]) 12/11/2013  . PVD (peripheral vascular disease) ([place]) 12/11/2013  . Peripheral arterial disease ([place]) 09/30/2013  . Carotid artery disease ([place]) 09/30/2013  . Acute chest pain 11/15/2012  . Left-sided chest wall pain 03/19/2012  . Bruit 09/15/2010  . CAD (coronary artery disease) nonobstructive per cath 2012   . Chronic kidney disease (CKD), stage IV (severe) ([place])   . Precordial chest pain 07/26/2009  . DM (diabetes  mellitus) ([place]) 07/22/2009  . Hyperlipidemia LDL goal <70 07/22/2009  . Hypertension 07/22/2009   Past Medical History:  Diagnosis Date  . Anemia of chronic disease   . CAD (coronary artery disease)    a.  Myoview 4/11: EF 53%, no scar or ischemia   c. MV 2012 Nl perfusion, apical thinning.  No ischemia or scar.  EF 49%, appears greater by visual estimate.;  d.  Dob stress echo 12/13:  Negative Dob stress echo. There is no evidence of ischemia.  The LVF is normal. b. Normal cors 2016.  . Carotid stenosis     a. <40% RICA, >98% LICA by duplex 04/1912  . Chronic chest pain    occ  . ESRD (end stage renal disease) on dialysis ([place])    M-W-F  . GERD (gastroesophageal reflux disease)   . HNP (herniated nucleus pulposus), lumbar   . HTN (hypertension)    echo 3/10: EF 60%, LAE  . Hyperlipidemia   . Nephrolithiasis    "passed them all"  . Peripheral arterial disease ([place])    a. s/p PTCA to L SFA.  Marland Kitchen Pneumonia   . Restless legs   . Sleep apnea    no cpap, needs to reschedule appointment to set up aquiring cpap  . Snores    a. presumed OSA, pt has refused sleep eval in past.  . Type II diabetes mellitus (HCC)    no longer on medications, checks blood glucose at home    Family History  Problem Relation Age of Onset  . Heart attack Sister        died @ 2  . Cancer Mother        died @ 18; unknown type  . Diabetes Brother        deceased  . Cirrhosis Father        alcohol related  . Diabetes Father   . Esophageal cancer Neg Hx   . Colon cancer Neg Hx   . Pancreatic cancer Neg Hx   . Stomach cancer Neg Hx     Past Surgical History:  Procedure Laterality Date  . ANGIOPLASTY / STENTING FEMORAL Left 12/11/2013   dr berry  . AV FISTULA PLACEMENT Left 03/19/2014   Procedure: CREATION OF ARTERIOVENOUS (AV) FISTULA  LEFT UPPER ARM;  Surgeon: Mal Misty, MD;  Location: [facility];  Service: Vascular;  Laterality: Left;  . CARDIAC CATHETERIZATION  2001 and 2010   . COLONOSCOPY W/ BIOPSIES AND POLYPECTOMY    . COLONOSCOPY WITH PROPOFOL N/A 08/01/2016   Procedure: COLONOSCOPY WITH PROPOFOL;  Surgeon: Carol Ada, MD;  [facility];  Service: Endoscopy;  Laterality: N/A;  . ESOPHAGOGASTRODUODENOSCOPY (EGD) WITH PROPOFOL N/A 08/01/2016   Procedure: ESOPHAGOGASTRODUODENOSCOPY (EGD) WITH PROPOFOL;  Surgeon: Carol Ada, MD;  [facility];  Service: Endoscopy;  Laterality: N/A;  . FOOT FRACTURE SURGERY Right   . FRACTURE SURGERY    . INGUINAL HERNIA REPAIR Left   . LEFT HEART  CATH AND CORS/GRAFTS ANGIOGRAPHY N/A 04/27/2017   Procedure: LEFT HEART CATH AND CORS/GRAFTS ANGIOGRAPHY;  Surgeon: Leonie Man, MD;  Location: [facility];  Service: Cardiovascular;  Laterality: N/A;  . LEFT HEART CATHETERIZATION WITH CORONARY ANGIOGRAM N/A 06/22/2014   Procedure: LEFT HEART CATHETERIZATION WITH CORONARY ANGIOGRAM;  Surgeon: Troy Sine, MD;  Location: [facility] CATH LAB;  Service: Cardiovascular;  Laterality: N/A;  . LOWER EXTREMITY ANGIOGRAM Left 12/11/2013   Procedure: LOWER EXTREMITY ANGIOGRAM;  Surgeon: Lorretta Harp, MD;  Location: [facility] CATH LAB;  Service: Cardiovascular;  Laterality: Left;  . LUMBAR LAMINECTOMY/DECOMPRESSION MICRODISCECTOMY Right 07/03/2017   Procedure: MICRODISCECTOMY LUMBAR FIVE - SACRAL ONE RIGHT;  Surgeon: Consuella Lose, MD;  Location: [facility];  Service: Neurosurgery;  Laterality: Right;  . LUMBAR LAMINECTOMY/DECOMPRESSION MICRODISCECTOMY Right 10/19/2017   Procedure: MICRODISCECTOMY LUMBAR FIVE- SACRAL 1 ONE ;  Surgeon: Consuella Lose, MD;  Location: [facility];  Service: Neurosurgery;  Laterality: Right;  . TONSILLECTOMY AND ADENOIDECTOMY     Social History   Occupational History  . Not on file  Tobacco Use  . Smoking status: Former Smoker    Packs/day: 1.00    Years: 2.00    Pack years: 2.00    Types: Cigarettes  . Smokeless tobacco: Never Used  . Tobacco comment: quit smoking 40 yrs ago  Substance and Sexual Activity  . Alcohol use: No    Alcohol/week: 0.0 standard drinks  . Drug use: No  . Sexual activity: Yes    Birth control/protection: None

## 2018-01-28 ENCOUNTER — Other Ambulatory Visit: Payer: Self-pay | Admitting: Neurosurgery

## 2018-01-28 ENCOUNTER — Telehealth: Payer: Self-pay | Admitting: Cardiovascular Disease

## 2018-01-28 NOTE — Telephone Encounter (Signed)
   [facility] Pre-operative Risk Assessment    Request for surgical clearance:  1. What type of surgery is being performed? Posterior lumbar fusion   2. When is this surgery scheduled? 02/12/2018   3. What type of clearance is required (medical clearance vs. Pharmacy clearance to hold med vs. Both)? Both  4. Are there any medications that need to be held prior to surgery and how long? None specified - on ASA & Plavix  5. Practice name and name of physician performing surgery? Dr. Consuella Lose @ [facility]   6. What is your office phone number [phone] ext: [phone]    1.   What is your office fax number [phone] Attn: Nikki  8.   Anesthesia type (None, local, MAC, general) ? Not specified    Jon Anderson 01/28/2018, 5:09 PM  _________________________________________________________________   (provider comments below)

## 2018-01-30 NOTE — Telephone Encounter (Signed)
Patient underwent off-pump LIMA insertion at [facility] in 01/30/2017, last cath 04/27/2017 showed patent LIMA to LAD. Dr. Gwenlyn Found to review whether ASA and plavix can be held prior to posterior lumbar fusion.   Please forward your response to P CV DIV PREOP

## 2018-01-31 NOTE — Telephone Encounter (Signed)
OK to hold anti platelet agents for surg

## 2018-01-31 NOTE — Telephone Encounter (Signed)
See recommendation by Dr. Gwenlyn Found. Will need to call patient

## 2018-01-31 NOTE — Telephone Encounter (Signed)
Patient was contacted 01/31/2018 in reference to pre-operative risk assessment for pending surgery as outlined below.  Jon Anderson was last seen on 12/19/17 by Dr.[name].  Since that day, Jon Anderson has done well.   Therefore, based on ACC/AHA guidelines, the patient would be at acceptable risk for the planned procedure without further cardiovascular testing.   Okay to hold ASA & plavix for 5-7 days.   [place], [place] 01/31/2018, 4:10 PM

## 2018-02-12 ENCOUNTER — Inpatient Hospital Stay: Admit: 2018-02-12 | Payer: Medicare Other | Admitting: Neurosurgery

## 2018-02-12 SURGERY — POSTERIOR LUMBAR FUSION 2 LEVEL
Anesthesia: General

## 2018-02-17 ENCOUNTER — Emergency Department (HOSPITAL_COMMUNITY)
Admission: EM | Admit: 2018-02-17 | Discharge: 2018-02-17 | Disposition: A | Discharge status: Home / Self Care | Payer: Medicare Other | Attending: Emergency Medicine | Admitting: Emergency Medicine

## 2018-02-17 ENCOUNTER — Other Ambulatory Visit: Payer: Self-pay

## 2018-02-17 ENCOUNTER — Encounter (HOSPITAL_COMMUNITY): Payer: Self-pay

## 2018-02-17 ENCOUNTER — Emergency Department (HOSPITAL_COMMUNITY): Payer: Medicare Other

## 2018-02-17 DIAGNOSIS — M5442 Lumbago with sciatica, left side: Secondary | ICD-10-CM

## 2018-02-17 DIAGNOSIS — Z87891 Personal history of nicotine dependence: Secondary | ICD-10-CM | POA: Insufficient documentation

## 2018-02-17 DIAGNOSIS — Z992 Dependence on renal dialysis: Secondary | ICD-10-CM | POA: Diagnosis not present

## 2018-02-17 DIAGNOSIS — M5441 Lumbago with sciatica, right side: Secondary | ICD-10-CM

## 2018-02-17 DIAGNOSIS — Z79899 Other long term (current) drug therapy: Secondary | ICD-10-CM | POA: Insufficient documentation

## 2018-02-17 DIAGNOSIS — Z7902 Long term (current) use of antithrombotics/antiplatelets: Secondary | ICD-10-CM | POA: Insufficient documentation

## 2018-02-17 DIAGNOSIS — Z7982 Long term (current) use of aspirin: Secondary | ICD-10-CM | POA: Insufficient documentation

## 2018-02-17 DIAGNOSIS — N186 End stage renal disease: Secondary | ICD-10-CM | POA: Insufficient documentation

## 2018-02-17 DIAGNOSIS — M545 Low back pain: Secondary | ICD-10-CM | POA: Insufficient documentation

## 2018-02-17 DIAGNOSIS — I120 Hypertensive chronic kidney disease with stage 5 chronic kidney disease or end stage renal disease: Secondary | ICD-10-CM | POA: Diagnosis not present

## 2018-02-17 DIAGNOSIS — E119 Type 2 diabetes mellitus without complications: Secondary | ICD-10-CM | POA: Insufficient documentation

## 2018-02-17 DIAGNOSIS — I2510 Atherosclerotic heart disease of native coronary artery without angina pectoris: Secondary | ICD-10-CM | POA: Insufficient documentation

## 2018-02-17 LAB — CBC WITH DIFFERENTIAL/PLATELET
Abs Immature Granulocytes: 0.02 10*3/uL (ref 0.00–0.07)
Basophils Absolute: 0 10*3/uL (ref 0.0–0.1)
Basophils Relative: 0 %
Eosinophils Absolute: 0.2 10*3/uL (ref 0.0–0.5)
Eosinophils Relative: 2 %
HCT: 37.7 % — ABNORMAL LOW (ref 39.0–52.0)
Hemoglobin: 11.7 g/dL — ABNORMAL LOW (ref 13.0–17.0)
Immature Granulocytes: 0 %
Lymphocytes Relative: 37 %
Lymphs Abs: 3 10*3/uL (ref 0.7–4.0)
MCH: 30.2 pg (ref 26.0–34.0)
MCHC: 31 g/dL (ref 30.0–36.0)
MCV: 97.2 fL (ref 80.0–100.0)
Monocytes Absolute: 0.6 10*3/uL (ref 0.1–1.0)
Monocytes Relative: 8 %
Neutro Abs: 4.4 10*3/uL (ref 1.7–7.7)
Neutrophils Relative %: 53 %
Platelets: 259 10*3/uL (ref 150–400)
RBC: 3.88 MIL/uL — ABNORMAL LOW (ref 4.22–5.81)
RDW: 13.2 % (ref 11.5–15.5)
WBC: 8.2 10*3/uL (ref 4.0–10.5)
nRBC: 0 % (ref 0.0–0.2)

## 2018-02-17 LAB — COMPREHENSIVE METABOLIC PANEL
ALT: 13 U/L (ref 0–44)
AST: 9 U/L — ABNORMAL LOW (ref 15–41)
Albumin: 3.7 g/dL (ref 3.5–5.0)
Alkaline Phosphatase: 51 U/L (ref 38–126)
Anion gap: 14 (ref 5–15)
BUN: 76 mg/dL — ABNORMAL HIGH (ref 8–23)
CO2: 25 mmol/L (ref 22–32)
Calcium: 8.3 mg/dL — ABNORMAL LOW (ref 8.9–10.3)
Chloride: 94 mmol/L — ABNORMAL LOW (ref 98–111)
Creatinine, Ser: 15.02 mg/dL — ABNORMAL HIGH (ref 0.61–1.24)
GFR calc Af Amer: 3 mL/min — ABNORMAL LOW (ref >60)
GFR calc non Af Amer: 3 mL/min — ABNORMAL LOW (ref >60)
Glucose, Bld: 116 mg/dL — ABNORMAL HIGH (ref 70–99)
Potassium: 3.9 mmol/L (ref 3.5–5.1)
Sodium: 133 mmol/L — ABNORMAL LOW (ref 135–145)
Total Bilirubin: 0.6 mg/dL (ref 0.3–1.2)
Total Protein: 6.2 g/dL — ABNORMAL LOW (ref 6.5–8.1)

## 2018-02-17 MED ORDER — CYCLOBENZAPRINE HCL 5 MG PO TABS: 5.0000 mg | ORAL_TABLET | Freq: Three times a day (TID) | ORAL | 0 refills | Status: DC | PRN

## 2018-02-17 MED ORDER — HYDROMORPHONE HCL 1 MG/ML IJ SOLN
1.0000 mg | Freq: Once | INTRAMUSCULAR | Status: AC
Start: 2018-02-17 — End: 2018-02-17
  Administered 2018-02-17: 1 mg via INTRAVENOUS
  Filled 2018-02-17: qty 1

## 2018-02-17 MED ORDER — DIAZEPAM 5 MG/ML IJ SOLN
2.5000 mg | Freq: Once | INTRAMUSCULAR | Status: AC
  Administered 2018-02-17: 2.5 mg via INTRAVENOUS
  Filled 2018-02-17: qty 2

## 2018-02-17 MED ORDER — HYDROMORPHONE HCL 1 MG/ML IJ SOLN
1.0000 mg | Freq: Once | INTRAMUSCULAR | Status: AC
  Administered 2018-02-17: 1 mg via INTRAVENOUS
  Filled 2018-02-17: qty 1

## 2018-02-17 NOTE — Discharge Instructions (Addendum)
Continue tramadol for pain. If you run out, you need to talk to your doctor. Also talk to your doctor if tramadol is not controlling your pain.   Take flexeril for muscle spasms.   You need to get dialysis tomorrow   See neurosurgery for follow up to schedule surgery.   Return to ER if you have worse weakness, numbness, unable to walk, trouble urinating.

## 2018-02-17 NOTE — ED Provider Notes (Addendum)
[facility] EMERGENCY DEPARTMENT Provider Note   CSN: [id number] Arrival date & time: 02/17/18  1554     History   Chief Complaint Chief Complaint  Patient presents with  . Back Pain    HPI ENIO HORNBACK is a 67 y.o. male history of CAD, ESRD on HD (last HD 4 days ago), previous laminectomy for his herniation here presenting with back pain.  Has chronic back pain from disc herniation.  Patient had one surgery done in June and was planned for another revision surgery 5 days ago with Dr. Ralene Ok.  However, because patient had some financial issues so was unable to schedule the surgery.  Patient states that over the last month or so he had progressively worsening back pain.  The pain does radiate down both legs.  He states that sometimes he has a lot of pain so he was unable to walk but denies any numbness or weakness.  Patient denies any chest pain or shortness of breath.  Patient is currently taking tramadol as prescribed by his doctor and just finished a course of prednisone with no relief.  Of note, patient states that he missed his dialysis session 2 days ago due to severe pain.  Denies any fevers or chills.  The history is provided by the patient.    Past Medical History:  Diagnosis Date  . Anemia of chronic disease   . CAD (coronary artery disease)    a.  Myoview 4/11: EF 53%, no scar or ischemia   c. MV 2012 Nl perfusion, apical thinning.  No ischemia or scar.  EF 49%, appears greater by visual estimate.;  d.  Dob stress echo 12/13:  Negative Dob stress echo. There is no evidence of ischemia.  The LVF is normal. b. Normal cors 2016.  . Carotid stenosis    a. <59% RICA, >56% LICA by duplex 06/8754  . Chronic chest pain    occ  . ESRD (end stage renal disease) on dialysis ([place])    M-W-F  . GERD (gastroesophageal reflux disease)   . HNP (herniated nucleus pulposus), lumbar   . HTN (hypertension)    echo 3/10: EF 60%, LAE  . Hyperlipidemia   .  Nephrolithiasis    "passed them all"  . Peripheral arterial disease ([place])    a. s/p PTCA to L SFA.  Marland Kitchen Pneumonia   . Restless legs   . Sleep apnea    no cpap, needs to reschedule appointment to set up aquiring cpap  . Snores    a. presumed OSA, pt has refused sleep eval in past.  . Type II diabetes mellitus ([place])    no longer on medications, checks blood glucose at home    Patient Active Problem List   Diagnosis Date Noted  . HNP (herniated nucleus pulposus), lumbar 07/03/2017  . Chest pain in adult 04/27/2017  . Restless leg syndrome, uncontrolled 04/27/2017  . 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 Anemia of chronic disease 03/17/2017  . Severe uncontrolled hypertension 03/17/2017  . ESRD (end stage renal disease) on dialysis ([place]) 03/17/2017  . Hyperlipidemia 03/17/2017  . Chronic chest pain 03/17/2017  . Type II diabetes mellitus ([place]) 03/17/2017  . Acute on chronic diastolic heart failure ([place]) 03/17/2017  . Acute heart failure ([place]) 03/17/2017  . Lumbar radiculopathy 01/16/2017  . Chest pain, non-cardiac 10/09/2015  . Acute on chronic renal failure ([place]) 06/16/2014  . Renal failure (ARF), acute on chronic (HCC) 06/16/2014  . Shoulder pain,  left 12/15/2013  . Chest pain 12/15/2013  . Claudication ([place]) 12/11/2013  . PVD (peripheral vascular disease) ([place]) 12/11/2013  . Peripheral arterial disease ([place]) 09/30/2013  . Carotid artery disease ([place]) 09/30/2013  . Acute chest pain 11/15/2012  . Left-sided chest wall pain 03/19/2012  . Bruit 09/15/2010  . CAD (coronary artery disease) nonobstructive per cath 2012   . Chronic kidney disease (CKD), stage IV (severe) ([place])   . Precordial chest pain 07/26/2009  . DM (diabetes mellitus) ([place]) 07/22/2009  . Hyperlipidemia LDL goal <70 07/22/2009  . Hypertension 07/22/2009    Past Surgical History:  Procedure Laterality Date  . ANGIOPLASTY / STENTING FEMORAL Left 12/11/2013   dr berry  . AV FISTULA PLACEMENT Left  03/19/2014   Procedure: CREATION OF ARTERIOVENOUS (AV) FISTULA  LEFT UPPER ARM;  Surgeon: Mal Misty, MD;  Location: [facility];  Service: Vascular;  Laterality: Left;  . CARDIAC CATHETERIZATION  2001 and 2010   . COLONOSCOPY W/ BIOPSIES AND POLYPECTOMY    . COLONOSCOPY WITH PROPOFOL N/A 08/01/2016   Procedure: COLONOSCOPY WITH PROPOFOL;  Surgeon: Carol Ada, MD;  [facility];  Service: Endoscopy;  Laterality: N/A;  . ESOPHAGOGASTRODUODENOSCOPY (EGD) WITH PROPOFOL N/A 08/01/2016   Procedure: ESOPHAGOGASTRODUODENOSCOPY (EGD) WITH PROPOFOL;  Surgeon: Carol Ada, MD;  [facility];  Service: Endoscopy;  Laterality: N/A;  . FOOT FRACTURE SURGERY Right   . FRACTURE SURGERY    . INGUINAL HERNIA REPAIR Left   . LEFT HEART CATH AND CORS/GRAFTS ANGIOGRAPHY N/A 04/27/2017   Procedure: LEFT HEART CATH AND CORS/GRAFTS ANGIOGRAPHY;  Surgeon: Leonie Man, MD;  Location: [facility];  Service: Cardiovascular;  Laterality: N/A;  . LEFT HEART CATHETERIZATION WITH CORONARY ANGIOGRAM N/A 06/22/2014   Procedure: LEFT HEART CATHETERIZATION WITH CORONARY ANGIOGRAM;  Surgeon: Troy Sine, MD;  Location: [facility] CATH LAB;  Service: Cardiovascular;  Laterality: N/A;  . LOWER EXTREMITY ANGIOGRAM Left 12/11/2013   Procedure: LOWER EXTREMITY ANGIOGRAM;  Surgeon: Lorretta Harp, MD;  Location: [facility] CATH LAB;  Service: Cardiovascular;  Laterality: Left;  . LUMBAR LAMINECTOMY/DECOMPRESSION MICRODISCECTOMY Right 07/03/2017   Procedure: MICRODISCECTOMY LUMBAR FIVE - SACRAL ONE RIGHT;  Surgeon: Consuella Lose, MD;  Location: [facility];  Service: Neurosurgery;  Laterality: Right;  . LUMBAR LAMINECTOMY/DECOMPRESSION MICRODISCECTOMY Right 10/19/2017   Procedure: MICRODISCECTOMY LUMBAR FIVE- SACRAL 1 ONE ;  Surgeon: Consuella Lose, MD;  Location: [facility];  Service: Neurosurgery;  Laterality: Right;  . TONSILLECTOMY AND ADENOIDECTOMY          Home Medications    Prior to Admission medications     Medication Sig Start Date End Date Taking? Authorizing Provider  acetaminophen (TYLENOL) 500 MG tablet Take 1,000 mg by mouth 2 (two) times daily as needed for moderate pain or headache.    [provider]  aspirin EC 81 MG tablet Take 1 tablet (81 mg total) by mouth daily. 05/28/12   Richardson Dopp T, PA-C  atorvastatin (LIPITOR) 40 MG tablet Take 1 tablet (40 mg total) by mouth daily at 6 PM. 12/13/13   Brett Canales, PA-C  calcium acetate (PHOSLO) 667 MG capsule Take 3 capsules (2,001 mg total) by mouth 3 (three) times daily with meals. Patient taking differently: Take 1,334 mg by mouth 2 (two) times daily with a meal.  10/09/15   Janece Canterbury, MD  clopidogrel (PLAVIX) 75 MG tablet Take 1 tablet (75 mg total) by mouth daily with breakfast. 07/08/17   Consuella Lose, MD  cyclobenzaprine (FLEXERIL) 10 MG  tablet Take 10 mg by mouth 3 (three) times daily.    [provider]  diclofenac (VOLTAREN) 75 MG EC tablet Take 75 mg by mouth 2 (two) times daily.    [provider]  gabapentin (NEURONTIN) 100 MG capsule Take 100 mg by mouth 2 (two) times daily.    [provider]  glycerin adult 2 g suppository Place 1 suppository rectally as needed for constipation.    [provider]  multivitamin (RENA-VIT) TABS tablet Take 1 tablet by mouth at bedtime. 06/23/14   Domenic Polite, MD  nitroGLYCERIN (NITROSTAT) 0.4 MG SL tablet Place 1 tablet (0.4 mg total) under the tongue every 5 (five) minutes as needed for chest pain. 03/20/12   Barrett, Evelene Croon, PA-C  oxyCODONE (OXY IR/ROXICODONE) 5 MG immediate release tablet Take 1 tablet (5 mg total) by mouth every 4 (four) hours as needed for moderate pain or severe pain ((score 4 to 6)). 10/20/17   Kristeen Miss, MD  pantoprazole (PROTONIX) 40 MG tablet Take 40 mg by mouth daily.    [provider]  polyethylene glycol (MIRALAX / GLYCOLAX) packet Take 17 g by mouth daily as needed for moderate constipation.  03/22/17   Aline August, MD  rOPINIRole (REQUIP) 0.5 MG tablet Take 0.5 mg by mouth 2 (two) times daily.     [provider]    Family History Family History  Problem Relation Age of Onset  . Heart attack Sister        died @ 102  . Cancer Mother        died @ 32; unknown type  . Diabetes Brother        deceased  . Cirrhosis Father        alcohol related  . Diabetes Father   . Esophageal cancer Neg Hx   . Colon cancer Neg Hx   . Pancreatic cancer Neg Hx   . Stomach cancer Neg Hx     Social History Social History   Tobacco Use  . Smoking status: Former Smoker    Packs/day: 1.00    Years: 2.00    Pack years: 2.00    Types: Cigarettes  . Smokeless tobacco: Never Used  . Tobacco comment: quit smoking 40 yrs ago  Substance Use Topics  . Alcohol use: No    Alcohol/week: 0.0 standard drinks  . Drug use: No     Allergies   Kiwi extract and Tape   Review of Systems Review of Systems  Musculoskeletal: Positive for back pain.  All other systems reviewed and are negative.    Physical Exam Updated Vital Signs BP (!) 177/90   Pulse 73   Temp 98.2 F (36.8 C) (Oral)   Resp 20   Ht 5\' 11"  (1.803 m)   Wt 73.5 kg   SpO2 97%   BMI 22.59 kg/m   Physical Exam  Constitutional: He is oriented to person, place, and time.  Uncomfortable   HENT:  Head: Normocephalic.  Mouth/Throat: Oropharynx is clear and moist.  Eyes: Pupils are equal, round, and reactive to light. Conjunctivae and EOM are normal.  Neck: Normal range of motion. Neck supple.  Cardiovascular: Normal rate, regular rhythm and normal heart sounds.  Pulmonary/Chest: Effort normal and breath sounds normal. No stridor. No respiratory distress.  Abdominal: Soft. Bowel sounds are normal. He exhibits no distension. There is no tenderness.  Musculoskeletal:  Mild diffuse lower back tenderness, no obvious deformity. + straight leg raise bilaterally.   Neurological: He  is alert and oriented to person,  place, and time.  No saddle anesthesia. Nl reflexes throughout, nl strength and sensation bilateral lower extremities   Skin: Skin is warm. Capillary refill takes less than 2 seconds.  Psychiatric: He has a normal mood and affect.  Nursing note and vitals reviewed.    ED Treatments / Results  Labs (all labs ordered are listed, but only abnormal results are displayed) Labs Reviewed  CBC WITH DIFFERENTIAL/PLATELET - Abnormal; Notable for the following components:      Result Value   RBC 3.88 (*)    Hemoglobin 11.7 (*)    HCT 37.7 (*)    All other components within normal limits  COMPREHENSIVE METABOLIC PANEL - Abnormal; Notable for the following components:   Sodium 133 (*)    Chloride 94 (*)    Glucose, Bld 116 (*)    BUN 76 (*)    Creatinine, Ser 15.02 (*)    Calcium 8.3 (*)    Total Protein 6.2 (*)    AST 9 (*)    GFR calc non Af Amer 3 (*)    GFR calc Af Amer 3 (*)    All other components within normal limits  I-STAT CHEM 8, ED    EKG None  Radiology Dg Lumbar Spine Complete  Result Date: 02/17/2018 CLINICAL DATA:  67 y/o M; Three months of back pain. Increasing difficulty walking. EXAM: LUMBAR SPINE - COMPLETE 4+ VIEW COMPARISON:  01/13/2018 lumbar spine MRI. 06/02/2016 lumbar spine radiographs. FINDINGS: Five lumbar type non-rib-bearing vertebral bodies. Mild lumbar dextrocurvature with apex at L3. L4-5 grade 1 anterolisthesis is stable.Moderate loss of intervertebral disc space height at the L4-5 and L5-S1 levels. Mild loss of intervertebral disc space height at L3-4. No loss of vertebral body height. No acute fracture. Calcific atherosclerosis of the abdominal aorta. Lower anterior abdominal wall mesh hernia repair anchors noted. IMPRESSION: 1. No acute fracture or dislocation. 2. Stable grade 1 L4-5 anterolisthesis. Mild lumbar dextrocurvature. 3. Stable lumbar spondylosis greatest at L4-5 and L5-S1 levels. Electronically Signed   By: Kristine Garbe M.D.   On:  02/17/2018 17:18    Procedures Procedures (including critical care time)  Medications Ordered in ED Medications  HYDROmorphone (DILAUDID) injection 1 mg (has no administration in time range)  HYDROmorphone (DILAUDID) injection 1 mg (1 mg Intravenous Given 02/17/18 1619)  diazepam (VALIUM) injection 2.5 mg (2.5 mg Intravenous Given 02/17/18 1620)     Initial Impression / Assessment and Plan / ED Course  I have reviewed the triage vital signs and the nursing notes.  Pertinent labs & imaging results that were available during my care of the patient were reviewed by me and considered in my medical decision making (see chart for details).    RAQUAN IANNONE is a 67 y.o. male here with back pain. Patient has known disc herniation and neurosurgery planned for revision surgery but patient unable to schedule it due to financial reasons. Patient has sciatica symptoms but no saddle anesthesia and is neurovascular intact bilateral lower extremities. No emergent need for MRI so will give pain meds to control his pain. His missed dialysis 2 days ago so will check chemistry.   5:43 PM Labs showed Cr 15. Potassium is normal. Xrays unremarkable. Pain slightly improved with pain meds. He has prescription of tramadol by his doctor. Will add flexeril for muscle spasms. Since steroids didn't help, will not try it again. Told him to talk to his doctor if he runs out of tramadol  or for prescription of narcotics. He also needs to follow up with neurosurgery and schedule surgery.    Final Clinical Impressions(s) / ED Diagnoses   Final diagnoses:  None    ED Discharge Orders    None       Drenda Freeze, MD 02/17/18 1744    Drenda Freeze, MD 02/17/18 1745

## 2018-02-17 NOTE — ED Triage Notes (Signed)
Pt presents from home for "aggressive" back pain x3 months. Pt endorses an increase in difficulty walking over the last month. Pt denies numbness but states "sometimes my left leg gets cold". Pt has dialysis scheduled MWF, states did not go on Friday due to pain being too severe. Pt states he was supposed to have surgery on his back a few days ago, but was unable due to a financial issue. Pt states he uses tramadol at home for the pain.

## 2018-02-17 NOTE — ED Notes (Signed)
Patient verbalizes understanding of discharge instructions. Opportunity for questioning and answers were provided. Armband removed by staff, pt discharged from ED.  

## 2018-02-21 ENCOUNTER — Other Ambulatory Visit: Payer: Self-pay | Admitting: Neurosurgery

## 2018-02-22 ENCOUNTER — Encounter (HOSPITAL_COMMUNITY): Payer: Self-pay | Admitting: Emergency Medicine

## 2018-02-22 ENCOUNTER — Observation Stay (HOSPITAL_COMMUNITY)
Admission: EM | Admit: 2018-02-22 | Discharge: 2018-02-23 | Disposition: A | Discharge status: Home / Self Care | Payer: Medicare Other | Attending: Family Medicine | Admitting: Family Medicine

## 2018-02-22 ENCOUNTER — Emergency Department (HOSPITAL_COMMUNITY): Payer: Medicare Other

## 2018-02-22 ENCOUNTER — Other Ambulatory Visit: Payer: Self-pay

## 2018-02-22 ENCOUNTER — Telehealth: Payer: Self-pay | Admitting: Cardiovascular Disease

## 2018-02-22 DIAGNOSIS — I2510 Atherosclerotic heart disease of native coronary artery without angina pectoris: Secondary | ICD-10-CM | POA: Diagnosis present

## 2018-02-22 DIAGNOSIS — R7989 Other specified abnormal findings of blood chemistry: Secondary | ICD-10-CM | POA: Diagnosis present

## 2018-02-22 DIAGNOSIS — N186 End stage renal disease: Secondary | ICD-10-CM

## 2018-02-22 DIAGNOSIS — N2581 Secondary hyperparathyroidism of renal origin: Secondary | ICD-10-CM | POA: Diagnosis not present

## 2018-02-22 DIAGNOSIS — D631 Anemia in chronic kidney disease: Secondary | ICD-10-CM | POA: Insufficient documentation

## 2018-02-22 DIAGNOSIS — M5116 Intervertebral disc disorders with radiculopathy, lumbar region: Secondary | ICD-10-CM | POA: Diagnosis not present

## 2018-02-22 DIAGNOSIS — J449 Chronic obstructive pulmonary disease, unspecified: Secondary | ICD-10-CM | POA: Diagnosis not present

## 2018-02-22 DIAGNOSIS — I509 Heart failure, unspecified: Secondary | ICD-10-CM | POA: Diagnosis not present

## 2018-02-22 DIAGNOSIS — Z8249 Family history of ischemic heart disease and other diseases of the circulatory system: Secondary | ICD-10-CM | POA: Diagnosis not present

## 2018-02-22 DIAGNOSIS — R778 Other specified abnormalities of plasma proteins: Secondary | ICD-10-CM

## 2018-02-22 DIAGNOSIS — Z7982 Long term (current) use of aspirin: Secondary | ICD-10-CM | POA: Diagnosis not present

## 2018-02-22 DIAGNOSIS — Z87891 Personal history of nicotine dependence: Secondary | ICD-10-CM | POA: Insufficient documentation

## 2018-02-22 DIAGNOSIS — I272 Pulmonary hypertension, unspecified: Secondary | ICD-10-CM | POA: Diagnosis not present

## 2018-02-22 DIAGNOSIS — I6523 Occlusion and stenosis of bilateral carotid arteries: Secondary | ICD-10-CM | POA: Insufficient documentation

## 2018-02-22 DIAGNOSIS — I132 Hypertensive heart and chronic kidney disease with heart failure and with stage 5 chronic kidney disease, or end stage renal disease: Secondary | ICD-10-CM | POA: Insufficient documentation

## 2018-02-22 DIAGNOSIS — I10 Essential (primary) hypertension: Secondary | ICD-10-CM | POA: Diagnosis present

## 2018-02-22 DIAGNOSIS — Z992 Dependence on renal dialysis: Secondary | ICD-10-CM | POA: Diagnosis not present

## 2018-02-22 DIAGNOSIS — G8929 Other chronic pain: Secondary | ICD-10-CM | POA: Insufficient documentation

## 2018-02-22 DIAGNOSIS — E8889 Other specified metabolic disorders: Secondary | ICD-10-CM | POA: Diagnosis not present

## 2018-02-22 DIAGNOSIS — R0602 Shortness of breath: Secondary | ICD-10-CM

## 2018-02-22 DIAGNOSIS — Z951 Presence of aortocoronary bypass graft: Secondary | ICD-10-CM | POA: Diagnosis not present

## 2018-02-22 DIAGNOSIS — Z7902 Long term (current) use of antithrombotics/antiplatelets: Secondary | ICD-10-CM | POA: Insufficient documentation

## 2018-02-22 DIAGNOSIS — M5416 Radiculopathy, lumbar region: Secondary | ICD-10-CM | POA: Diagnosis present

## 2018-02-22 DIAGNOSIS — K219 Gastro-esophageal reflux disease without esophagitis: Secondary | ICD-10-CM | POA: Diagnosis not present

## 2018-02-22 DIAGNOSIS — E785 Hyperlipidemia, unspecified: Secondary | ICD-10-CM | POA: Insufficient documentation

## 2018-02-22 DIAGNOSIS — R0789 Other chest pain: Secondary | ICD-10-CM | POA: Diagnosis not present

## 2018-02-22 DIAGNOSIS — E1151 Type 2 diabetes mellitus with diabetic peripheral angiopathy without gangrene: Secondary | ICD-10-CM | POA: Insufficient documentation

## 2018-02-22 DIAGNOSIS — E1122 Type 2 diabetes mellitus with diabetic chronic kidney disease: Secondary | ICD-10-CM | POA: Insufficient documentation

## 2018-02-22 DIAGNOSIS — R079 Chest pain, unspecified: Secondary | ICD-10-CM | POA: Diagnosis present

## 2018-02-22 DIAGNOSIS — G2581 Restless legs syndrome: Secondary | ICD-10-CM | POA: Diagnosis not present

## 2018-02-22 DIAGNOSIS — G4730 Sleep apnea, unspecified: Secondary | ICD-10-CM | POA: Insufficient documentation

## 2018-02-22 DIAGNOSIS — R0601 Orthopnea: Secondary | ICD-10-CM | POA: Diagnosis present

## 2018-02-22 LAB — I-STAT TROPONIN, ED
Troponin i, poc: 0.11 ng/mL (ref 0.00–0.08)
Troponin i, poc: 0.13 ng/mL (ref 0.00–0.08)

## 2018-02-22 LAB — CBC WITH DIFFERENTIAL/PLATELET
Abs Immature Granulocytes: 0.01 10*3/uL (ref 0.00–0.07)
Basophils Absolute: 0 10*3/uL (ref 0.0–0.1)
Basophils Relative: 0 %
Eosinophils Absolute: 0.3 10*3/uL (ref 0.0–0.5)
Eosinophils Relative: 5 %
HCT: 37.9 % — ABNORMAL LOW (ref 39.0–52.0)
Hemoglobin: 11.9 g/dL — ABNORMAL LOW (ref 13.0–17.0)
Immature Granulocytes: 0 %
Lymphocytes Relative: 38 %
Lymphs Abs: 2.2 10*3/uL (ref 0.7–4.0)
MCH: 30 pg (ref 26.0–34.0)
MCHC: 31.4 g/dL (ref 30.0–36.0)
MCV: 95.5 fL (ref 80.0–100.0)
Monocytes Absolute: 0.6 10*3/uL (ref 0.1–1.0)
Monocytes Relative: 10 %
Neutro Abs: 2.8 10*3/uL (ref 1.7–7.7)
Neutrophils Relative %: 47 %
Platelets: 216 10*3/uL (ref 150–400)
RBC: 3.97 MIL/uL — ABNORMAL LOW (ref 4.22–5.81)
RDW: 12.9 % (ref 11.5–15.5)
WBC: 6 10*3/uL (ref 4.0–10.5)
nRBC: 0 % (ref 0.0–0.2)

## 2018-02-22 LAB — BASIC METABOLIC PANEL
Anion gap: 12 (ref 5–15)
BUN: 38 mg/dL — ABNORMAL HIGH (ref 8–23)
CO2: 29 mmol/L (ref 22–32)
Calcium: 8.4 mg/dL — ABNORMAL LOW (ref 8.9–10.3)
Chloride: 91 mmol/L — ABNORMAL LOW (ref 98–111)
Creatinine, Ser: 11.71 mg/dL — ABNORMAL HIGH (ref 0.61–1.24)
GFR calc Af Amer: 4 mL/min — ABNORMAL LOW (ref >60)
GFR calc non Af Amer: 4 mL/min — ABNORMAL LOW (ref >60)
Glucose, Bld: 85 mg/dL (ref 70–99)
Potassium: 4.4 mmol/L (ref 3.5–5.1)
Sodium: 132 mmol/L — ABNORMAL LOW (ref 135–145)

## 2018-02-22 LAB — BRAIN NATRIURETIC PEPTIDE: B Natriuretic Peptide: 1612.8 pg/mL — ABNORMAL HIGH (ref 0.0–100.0)

## 2018-02-22 MED ORDER — HYDROMORPHONE HCL 1 MG/ML IJ SOLN
1.0000 mg | Freq: Once | INTRAMUSCULAR | Status: AC
  Administered 2018-02-22: 1 mg via INTRAVENOUS
  Filled 2018-02-22: qty 1

## 2018-02-22 MED ORDER — IOPAMIDOL (ISOVUE-370) INJECTION 76%
100.0000 mL | Freq: Once | INTRAVENOUS | Status: AC | PRN
  Administered 2018-02-22: 100 mL via INTRAVENOUS

## 2018-02-22 MED ORDER — ONDANSETRON HCL 4 MG/2ML IJ SOLN
4.0000 mg | Freq: Once | INTRAMUSCULAR | Status: AC
  Administered 2018-02-22: 4 mg via INTRAVENOUS
  Filled 2018-02-22: qty 2

## 2018-02-22 MED ORDER — IOPAMIDOL (ISOVUE-370) INJECTION 76%
INTRAVENOUS | Status: AC
  Filled 2018-02-22: qty 100

## 2018-02-22 NOTE — ED Triage Notes (Signed)
Pt states he has increasing shob when lying flat. Has back surgery scheduled on the 5th. Also endorses llq pain into groin. Pt speaking in complete sentences during triage. Dialysis on m,w,f.  Did not complete tx today. Stopped at 2 hours d/t pain in his back.

## 2018-02-22 NOTE — ED Notes (Signed)
Dr Christy Gentles informed of troponin results .[place]

## 2018-02-22 NOTE — ED Provider Notes (Signed)
[facility] EMERGENCY DEPARTMENT Provider Note   CSN: [id number] Arrival date & time: 02/22/18  1921     History   Chief Complaint Chief Complaint  Patient presents with  . Shortness of Breath    HPI Jon Anderson is a 67 y.o. male.  67 year old male with history of chronic kidney disease on dialysis Monday, Wednesday, Friday as well as history of diet-controlled diabetes presents with complaint of shortness of breath.  Patient states that he feels somewhat short of breath when lying supine only, resolves completely if he sits up.  Patient also reports a "nagging" left-sided chest discomfort when he lies flat and feels short of breath, this resolves with sitting up as well.  Patient states that this discomfort started 2 days ago, sleeps on 2 pillows at baseline and continues to eat only 2 pillows to sleep.  Patient has a known herniated disc with scheduled surgery, reports chronic left lower back pain radiating to his left leg and radiating to his left groin area.  He denies abdominal pain, loss of bowel or bladder control, groin numbness.  Denies recent falls or injuries.  No other complaints or concerns.     Past Medical History:  Diagnosis Date  . Anemia of chronic disease   . CAD (coronary artery disease)    a.  Myoview 4/11: EF 53%, no scar or ischemia   c. MV 2012 Nl perfusion, apical thinning.  No ischemia or scar.  EF 49%, appears greater by visual estimate.;  d.  Dob stress echo 12/13:  Negative Dob stress echo. There is no evidence of ischemia.  The LVF is normal. b. Normal cors 2016.  . Carotid stenosis    a. <64% RICA, >40% LICA by duplex 06/4740  . Chronic chest pain    occ  . ESRD (end stage renal disease) on dialysis ([place])    M-W-F  . GERD (gastroesophageal reflux disease)   . HNP (herniated nucleus pulposus), lumbar   . HTN (hypertension)    echo 3/10: EF 60%, LAE  . Hyperlipidemia   . Nephrolithiasis    "passed them all"  . Peripheral  arterial disease ([place])    a. s/p PTCA to L SFA.  Marland Kitchen Pneumonia   . Restless legs   . Sleep apnea    no cpap, needs to reschedule appointment to set up aquiring cpap  . Snores    a. presumed OSA, pt has refused sleep eval in past.  . Type II diabetes mellitus ([place])    no longer on medications, checks blood glucose at home    Patient Active Problem List   Diagnosis Date Noted  . HNP (herniated nucleus pulposus), lumbar 07/03/2017  . Chest pain in adult 04/27/2017  . Restless leg syndrome, uncontrolled 04/27/2017  . 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 Anemia of chronic disease 03/17/2017  . Severe uncontrolled hypertension 03/17/2017  . ESRD (end stage renal disease) on dialysis ([place]) 03/17/2017  . Hyperlipidemia 03/17/2017  . Chronic chest pain 03/17/2017  . Type II diabetes mellitus ([place]) 03/17/2017  . Acute on chronic diastolic heart failure ([place]) 03/17/2017  . Acute heart failure ([place]) 03/17/2017  . Lumbar radiculopathy 01/16/2017  . Chest pain, non-cardiac 10/09/2015  . Acute on chronic renal failure ([place]) 06/16/2014  . Renal failure (ARF), acute on chronic ([facility]) 06/16/2014  . Shoulder pain, left 12/15/2013  . Chest pain 12/15/2013  . Claudication ([place]) 12/11/2013  . PVD (peripheral vascular disease) ([place]) 12/11/2013  .  Peripheral arterial disease ([place]) 09/30/2013  . Carotid artery disease ([place]) 09/30/2013  . Acute chest pain 11/15/2012  . Left-sided chest wall pain 03/19/2012  . Bruit 09/15/2010  . CAD (coronary artery disease) nonobstructive per cath 2012   . Chronic kidney disease (CKD), stage IV (severe) ([place])   . Precordial chest pain 07/26/2009  . DM (diabetes mellitus) ([place]) 07/22/2009  . Hyperlipidemia LDL goal <70 07/22/2009  . Hypertension 07/22/2009    Past Surgical History:  Procedure Laterality Date  . ANGIOPLASTY / STENTING FEMORAL Left 12/11/2013   dr berry  . AV FISTULA PLACEMENT Left 03/19/2014   Procedure: CREATION OF ARTERIOVENOUS (AV)  FISTULA  LEFT UPPER ARM;  Surgeon: Mal Misty, MD;  Location: [facility];  Service: Vascular;  Laterality: Left;  . CARDIAC CATHETERIZATION  2001 and 2010   . COLONOSCOPY W/ BIOPSIES AND POLYPECTOMY    . COLONOSCOPY WITH PROPOFOL N/A 08/01/2016   Procedure: COLONOSCOPY WITH PROPOFOL;  Surgeon: Carol Ada, MD;  Location: [facility];  Service: Endoscopy;  Laterality: N/A;  . ESOPHAGOGASTRODUODENOSCOPY (EGD) WITH PROPOFOL N/A 08/01/2016   Procedure: ESOPHAGOGASTRODUODENOSCOPY (EGD) WITH PROPOFOL;  Surgeon: Carol Ada, MD;  Location: [facility];  Service: Endoscopy;  Laterality: N/A;  . FOOT FRACTURE SURGERY Right   . FRACTURE SURGERY    . INGUINAL HERNIA REPAIR Left   . LEFT HEART CATH AND CORS/GRAFTS ANGIOGRAPHY N/A 04/27/2017   Procedure: LEFT HEART CATH AND CORS/GRAFTS ANGIOGRAPHY;  Surgeon: Leonie Man, MD;  Location: [facility];  Service: Cardiovascular;  Laterality: N/A;  . LEFT HEART CATHETERIZATION WITH CORONARY ANGIOGRAM N/A 06/22/2014   Procedure: LEFT HEART CATHETERIZATION WITH CORONARY ANGIOGRAM;  Surgeon: Troy Sine, MD;  Location[facility] C[facility] LAB;  Service: Cardiovascular;  Laterality: N/A;  . LOWER EXTREMITY ANGIOGRAM Left 12/11/2013   Procedure: LOWER EXTREMITY ANGIOGRAM;  Surgeon: Lorretta Harp, MD;  Location: [facility] CATH LAB;  Service: Cardiovascular;  Laterality: Left;  . LUMBAR LAMINECTOMY/DECOMPRESSION MICRODISCECTOMY Right 07/03/2017   Procedure: MICRODISCECTOMY LUMBAR FIVE - SACRAL ONE RIGHT;  Surgeon: Consuella Lose, MD;  Location: [facility];  Service: Neurosurgery;  Laterality: Right;  . LUMBAR LAMINECTOMY/DECOMPRESSION MICRODISCECTOMY Right 10/19/2017   Procedure: MICRODISCECTOMY LUMBAR FIVE- SACRAL 1 ONE ;  Surgeon: Consuella Lose, MD;  Location: [facility];  Service: Neurosurgery;  Laterality: Right;  . TONSILLECTOMY AND ADENOIDECTOMY          Home Medications    Prior to Admission medications   Medication Sig Start Date End Date Taking? Authorizing  Provider  acetaminophen (TYLENOL) 500 MG tablet Take 1,000 mg by mouth 2 (two) times daily as needed for moderate pain or headache.   Yes [provider]  aspirin EC 81 MG tablet Take 1 tablet (81 mg total) by mouth daily. 05/28/12  Yes Weaver, Scott T, PA-C  atorvastatin (LIPITOR) 40 MG tablet Take 1 tablet (40 mg total) by mouth daily at 6 PM. 12/13/13  Yes Brett Canales, PA-C  calcium acetate (PHOSLO) 667 MG capsule Take 3 capsules (2,001 mg total) by mouth 3 (three) times daily with meals. Patient taking differently: Take 1,334 mg by mouth 2 (two) times daily with a meal.  10/09/15  Yes Short, Noah Delaine, MD  clopidogrel (PLAVIX) 75 MG tablet Take 1 tablet (75 mg total) by mouth daily with breakfast. 07/08/17  Yes Consuella Lose, MD  cyclobenzaprine (FLEXERIL) 5 MG tablet Take 1 tablet (5 mg total) by mouth 3 (three) times daily as needed for muscle spasms. 02/17/18  Yes Shirlyn Goltz  Hsienta, MD  diclofenac (VOLTAREN) 75 MG EC tablet Take 75 mg by mouth 2 (two) times daily.   Yes [provider]  gabapentin (NEURONTIN) 100 MG capsule Take 100 mg by mouth 2 (two) times daily.   Yes [provider]  multivitamin (RENA-VIT) TABS tablet Take 1 tablet by mouth at bedtime. 06/23/14  Yes Domenic Polite, MD  nitroGLYCERIN (NITROSTAT) 0.4 MG SL tablet Place 1 tablet (0.4 mg total) under the tongue every 5 (five) minutes as needed for chest pain. 03/20/12  Yes Barrett, Evelene Croon, PA-C  oxyCODONE (OXY IR/ROXICODONE) 5 MG immediate release tablet Take 1 tablet (5 mg total) by mouth every 4 (four) hours as needed for moderate pain or severe pain ((score 4 to 6)). 10/20/17  Yes Elsner, Mallie Mussel, MD  pantoprazole (PROTONIX) 40 MG tablet Take 40 mg by mouth daily.   Yes [provider]  polyethylene glycol (MIRALAX / GLYCOLAX) packet Take 17 g by mouth daily as needed for moderate constipation. 03/22/17  Yes Aline August, MD  rOPINIRole (REQUIP) 0.5 MG tablet Take 0.5 mg by mouth 2  (two) times daily.    Yes [provider]  traMADol (ULTRAM) 50 MG tablet Take 50 mg by mouth every 6 (six) hours as needed for moderate pain.  02/06/18  Yes [provider]    Family History Family History  Problem Relation Age of Onset  . Heart attack Sister        died @ 80  . Cancer Mother        died @ 9; unknown type  . Diabetes Brother        deceased  . Cirrhosis Father        alcohol related  . Diabetes Father   . Esophageal cancer Neg Hx   . Colon cancer Neg Hx   . Pancreatic cancer Neg Hx   . Stomach cancer Neg Hx     Social History Social History   Tobacco Use  . Smoking status: Former Smoker    Packs/day: 1.00    Years: 2.00    Pack years: 2.00    Types: Cigarettes  . Smokeless tobacco: Never Used  . Tobacco comment: quit smoking 40 yrs ago  Substance Use Topics  . Alcohol use: No    Alcohol/week: 0.0 standard drinks  . Drug use: No     Allergies   Kiwi extract and Tape   Review of Systems Review of Systems  Constitutional: Negative for fever.  Respiratory: Positive for shortness of breath. Negative for chest tightness and wheezing.   Cardiovascular: Positive for chest pain. Negative for palpitations and leg swelling.  Gastrointestinal: Positive for constipation. Negative for abdominal pain, diarrhea, nausea and vomiting.  Skin: Negative for rash and wound.  Allergic/Immunologic: Positive for immunocompromised state.  Neurological: Negative for dizziness and weakness.  Psychiatric/Behavioral: Negative for confusion.  All other systems reviewed and are negative.    Physical Exam Updated Vital Signs BP (!) 169/100 (BP Location: Right Arm)   Pulse 79   Temp 98.1 F (36.7 C) (Oral)   Resp 12   SpO2 98%   Physical Exam  Constitutional: He is oriented to person, place, and time. He appears well-developed and well-nourished. No distress.  HENT:  Head: Normocephalic and atraumatic.  Cardiovascular: Normal rate, regular  rhythm and normal heart sounds.  Pulmonary/Chest: Effort normal and breath sounds normal.  Abdominal: Soft. There is no tenderness.  Musculoskeletal:       Right lower leg: Normal. He  exhibits no edema.       Left lower leg: Normal. He exhibits no edema.  Neurological: He is alert and oriented to person, place, and time.  Skin: Skin is warm and dry. He is not diaphoretic.  Psychiatric: He has a normal mood and affect. His behavior is normal.  Nursing note and vitals reviewed.    ED Treatments / Results  Labs (all labs ordered are listed, but only abnormal results are displayed) Labs Reviewed  BASIC METABOLIC PANEL - Abnormal; Notable for the following components:      Result Value   Sodium 132 (*)    Chloride 91 (*)    BUN 38 (*)    Creatinine, Ser 11.71 (*)    Calcium 8.4 (*)    GFR calc non Af Amer 4 (*)    GFR calc Af Amer 4 (*)    All other components within normal limits  CBC WITH DIFFERENTIAL/PLATELET - Abnormal; Notable for the following components:   RBC 3.97 (*)    Hemoglobin 11.9 (*)    HCT 37.9 (*)    All other components within normal limits  BRAIN NATRIURETIC PEPTIDE - Abnormal; Notable for the following components:   B Natriuretic Peptide 1,612.8 (*)    All other components within normal limits  I-STAT TROPONIN, ED - Abnormal; Notable for the following components:   Troponin i, poc 0.11 (*)    All other components within normal limits  I-STAT TROPONIN, ED - Abnormal; Notable for the following components:   Troponin i, poc 0.13 (*)    All other components within normal limits  TROPONIN I  TROPONIN I  TROPONIN I    EKG EKG Interpretation  Date/Time:  Friday February 22 2018 19:33:41 EST Ventricular Rate:  76 PR Interval:    QRS Duration: 86 QT Interval:  404 QTC Calculation: 455 R Axis:   14 Text Interpretation:  Sinus rhythm Anterior infarct, old Abnormal T, consider ischemia, lateral leads Confirmed by Fredia Sorrow [phone]) on 02/22/2018  7:38:54 PM   Radiology Dg Chest 2 View  Result Date: 02/22/2018 CLINICAL DATA:  Shortness of breath when laying flat. EXAM: CHEST - 2 VIEW COMPARISON:  04/27/2017 FINDINGS: There is no focal parenchymal opacity. There is no pleural effusion or pneumothorax. The heart and mediastinal contours are unremarkable. There is thoracic aortic atherosclerosis. The osseous structures are unremarkable. IMPRESSION: No active cardiopulmonary disease. Electronically Signed   By: Kathreen Devoid   On: 02/22/2018 20:20   Ct Angio Chest Pe W/cm &/or Wo Cm  Result Date: 02/22/2018 CLINICAL DATA:  Shortness of breath, chest pain, back pain EXAM: CT ANGIOGRAPHY CHEST WITH CONTRAST TECHNIQUE: Multidetector CT imaging of the chest was performed using the standard protocol during bolus administration of intravenous contrast. Multiplanar CT image reconstructions and MIPs were obtained to evaluate the vascular anatomy. CONTRAST:  17mL ISOVUE-370 IOPAMIDOL (ISOVUE-370) INJECTION 76% COMPARISON:  None. FINDINGS: Cardiovascular: Satisfactory opacification of the pulmonary arteries to the segmental level. No evidence of pulmonary embolism. Normal heart size. No pericardial effusion. Thoracic aortic. Coronary artery atherosclerosis in the left main, lad, circumflex, first diagonal branch and RCA. Mediastinum/Nodes: No enlarged mediastinal, hilar, or axillary lymph nodes. Thyroid gland, trachea, and esophagus demonstrate no significant findings. Lungs/Pleura: No focal consolidation. No pleural effusion or pneumothorax. Upper Abdomen: No acute upper abdominal abnormality. Musculoskeletal: No acute osseous abnormality. No aggressive osseous lesion. Mild degenerative disease of the thoracic spine. Other: Bilateral retroareolar fibroglandular soft tissue as can be seen with gynecomastia. Review of  the MIP images confirms the above findings. IMPRESSION: 1. No acute cardiopulmonary disease. 2.  Aortic Atherosclerosis (ICD10-I70.0). 3.  Multivessel coronary artery atherosclerosis. Electronically Signed   By: Kathreen Devoid   On: 02/22/2018 22:56    Procedures Procedures (including critical care time)  Medications Ordered in ED Medications  iopamidol (ISOVUE-370) 76 % injection (has no administration in time range)  HYDROmorphone (DILAUDID) injection 1 mg (1 mg Intravenous Given 02/22/18 2310)  ondansetron (ZOFRAN) injection 4 mg (4 mg Intravenous Given 02/22/18 2310)  iopamidol (ISOVUE-370) 76 % injection 100 mL (100 mLs Intravenous Contrast Given 02/22/18 2236)     Initial Impression / Assessment and Plan / ED Course  I have reviewed the triage vital signs and the nursing notes.  Pertinent labs & imaging results that were available during my care of the patient were reviewed by me and considered in my medical decision making (see chart for details).  Clinical Course as of Feb 23 25  Fri Nov 15, 612  [id number] 67 year old male with history of chronic renal disease, on dialysis, presents with complaint of chest pain shortness of breath x2 days.  Patient states his symptoms only occur when he is lying flat.  Symptoms resolved completely when he sits up.  Patient sleeps on 2 pillows, is able to still sleep on same, has not had increased pillow.  Denies swelling in his legs or any other complaints or concerns.  Patient attended dialysis today, states that he had to stop 2 hours early due to exacerbation of chronic back pain.  BMP with potassium 4.4, CBC without significant changes, BNP elevated at 1612 without any other symptoms to suggest CHF exacerbation.  Initial troponin is elevated at 0.11, no previously related troponins.  Discussed with Dr. Rogene Houston, er attending, recommends CTA chest and repeat troponin. CTA chest is negative for PE.   [LM]  2347 Repeat troponin is elevated at 0.13.    [LM]  Sat Feb 23, 2018  0009 Discussed with Dr. Harrington Challenger on-call with cardiology who will consult if medicine admits.   [LM]  0026  Discussed with [facility] who will admit.    [LM]    Clinical Course User Index [LM] Tacy Learn, PA-C   Final Clinical Impressions(s) / ED Diagnoses   Final diagnoses:  Shortness of breath  Chest pain, unspecified type  Elevated troponin    ED Discharge Orders    None       Tacy Learn, PA-C 02/23/18 3338    Fredia Sorrow, MD 02/23/18 0104

## 2018-02-22 NOTE — Telephone Encounter (Signed)
New Message:   Patient has back surge on 03/14/18 Patient stated he is getting out of breath a lot his left side of his leg is very cold. Patient said he can not tell if leg is swollen.

## 2018-02-22 NOTE — Telephone Encounter (Signed)
Returned call to patient he stated he will be having back surgery 03/14/18.Stated for the past 2 days he has been sob.No chest pain.Appointment scheduled with Jory Sims DNP 02/27/18 at 2:00 pm.Advised to go to ED if needed.

## 2018-02-22 NOTE — ED Notes (Signed)
Dr Rogene Houston informed of troponin results .Jon Anderson

## 2018-02-23 ENCOUNTER — Other Ambulatory Visit: Payer: Self-pay

## 2018-02-23 ENCOUNTER — Observation Stay (HOSPITAL_BASED_OUTPATIENT_CLINIC_OR_DEPARTMENT_OTHER): Payer: Medicare Other

## 2018-02-23 DIAGNOSIS — I340 Nonrheumatic mitral (valve) insufficiency: Secondary | ICD-10-CM | POA: Diagnosis not present

## 2018-02-23 DIAGNOSIS — R7989 Other specified abnormal findings of blood chemistry: Secondary | ICD-10-CM | POA: Diagnosis not present

## 2018-02-23 DIAGNOSIS — Z992 Dependence on renal dialysis: Secondary | ICD-10-CM

## 2018-02-23 DIAGNOSIS — R778 Other specified abnormalities of plasma proteins: Secondary | ICD-10-CM | POA: Diagnosis present

## 2018-02-23 DIAGNOSIS — Z951 Presence of aortocoronary bypass graft: Secondary | ICD-10-CM | POA: Diagnosis not present

## 2018-02-23 DIAGNOSIS — R079 Chest pain, unspecified: Secondary | ICD-10-CM

## 2018-02-23 DIAGNOSIS — R0600 Dyspnea, unspecified: Secondary | ICD-10-CM

## 2018-02-23 DIAGNOSIS — R0789 Other chest pain: Secondary | ICD-10-CM | POA: Diagnosis not present

## 2018-02-23 DIAGNOSIS — R748 Abnormal levels of other serum enzymes: Secondary | ICD-10-CM

## 2018-02-23 DIAGNOSIS — N186 End stage renal disease: Secondary | ICD-10-CM

## 2018-02-23 DIAGNOSIS — R0601 Orthopnea: Secondary | ICD-10-CM

## 2018-02-23 LAB — ECHOCARDIOGRAM COMPLETE
Height: 71 in
Weight: 2534.4 oz

## 2018-02-23 LAB — TROPONIN I
Troponin I: 0.09 ng/mL (ref <0.03)
Troponin I: 0.1 ng/mL (ref <0.03)
Troponin I: 0.09 ng/mL (ref <0.03)

## 2018-02-23 LAB — HIV ANTIBODY (ROUTINE TESTING W REFLEX): HIV Screen 4th Generation wRfx: NONREACTIVE

## 2018-02-23 MED ORDER — TRAMADOL HCL 50 MG PO TABS
50.0000 mg | ORAL_TABLET | Freq: Four times a day (QID) | ORAL | Status: DC | PRN
  Administered 2018-02-23 (×2): 50 mg via ORAL
  Filled 2018-02-23 (×2): qty 1

## 2018-02-23 MED ORDER — ASPIRIN EC 81 MG PO TBEC
81.0000 mg | DELAYED_RELEASE_TABLET | Freq: Every day | ORAL | Status: DC
  Administered 2018-02-23: 81 mg via ORAL
  Filled 2018-02-23: qty 1

## 2018-02-23 MED ORDER — POLYETHYLENE GLYCOL 3350 17 G PO PACK: 17.0000 g | PACK | Freq: Every day | ORAL | Status: DC | PRN

## 2018-02-23 MED ORDER — OXYCODONE HCL 5 MG PO TABS: 5.0000 mg | ORAL_TABLET | ORAL | 0 refills | Status: AC | PRN

## 2018-02-23 MED ORDER — PANTOPRAZOLE SODIUM 40 MG PO TBEC
40.0000 mg | DELAYED_RELEASE_TABLET | Freq: Every day | ORAL | Status: DC
  Administered 2018-02-23: 40 mg via ORAL
  Filled 2018-02-23: qty 1

## 2018-02-23 MED ORDER — ROPINIROLE HCL 0.5 MG PO TABS
0.5000 mg | ORAL_TABLET | Freq: Two times a day (BID) | ORAL | Status: DC
  Administered 2018-02-23 (×2): 0.5 mg via ORAL
  Filled 2018-02-23 (×3): qty 1

## 2018-02-23 MED ORDER — CYCLOBENZAPRINE HCL 5 MG PO TABS: 5.0000 mg | ORAL_TABLET | Freq: Three times a day (TID) | ORAL | Status: DC | PRN

## 2018-02-23 MED ORDER — ACETAMINOPHEN 500 MG PO TABS: 1000.0000 mg | ORAL_TABLET | Freq: Two times a day (BID) | ORAL | Status: DC | PRN

## 2018-02-23 MED ORDER — GABAPENTIN 100 MG PO CAPS
100.0000 mg | ORAL_CAPSULE | Freq: Two times a day (BID) | ORAL | Status: DC
  Administered 2018-02-23 (×2): 100 mg via ORAL
  Filled 2018-02-23 (×2): qty 1

## 2018-02-23 MED ORDER — RENA-VITE PO TABS
1.0000 | ORAL_TABLET | Freq: Every day | ORAL | Status: DC
  Administered 2018-02-23: 1 via ORAL
  Filled 2018-02-23: qty 1

## 2018-02-23 MED ORDER — ONDANSETRON HCL 4 MG/2ML IJ SOLN: 4.0000 mg | Freq: Four times a day (QID) | INTRAMUSCULAR | Status: DC | PRN

## 2018-02-23 MED ORDER — CALCIUM ACETATE (PHOS BINDER) 667 MG PO CAPS
1334.0000 mg | ORAL_CAPSULE | Freq: Two times a day (BID) | ORAL | Status: DC
  Administered 2018-02-23: 1334 mg via ORAL
  Filled 2018-02-23: qty 2

## 2018-02-23 MED ORDER — ATORVASTATIN CALCIUM 40 MG PO TABS
40.0000 mg | ORAL_TABLET | Freq: Every day | ORAL | Status: DC
  Filled 2018-02-23: qty 1

## 2018-02-23 MED ORDER — HYDRALAZINE HCL 20 MG/ML IJ SOLN
10.0000 mg | Freq: Four times a day (QID) | INTRAMUSCULAR | Status: DC | PRN
  Filled 2018-02-23: qty 1

## 2018-02-23 MED ORDER — OXYCODONE HCL 5 MG PO TABS
5.0000 mg | ORAL_TABLET | ORAL | Status: DC | PRN
  Administered 2018-02-23: 5 mg via ORAL
  Filled 2018-02-23: qty 1

## 2018-02-23 MED ORDER — HEPARIN SODIUM (PORCINE) 5000 UNIT/ML IJ SOLN
5000.0000 [IU] | Freq: Three times a day (TID) | INTRAMUSCULAR | Status: DC
  Administered 2018-02-23: 5000 [IU] via SUBCUTANEOUS
  Filled 2018-02-23: qty 1

## 2018-02-23 MED ORDER — ACETAMINOPHEN 325 MG PO TABS: 650.0000 mg | ORAL_TABLET | ORAL | Status: DC | PRN

## 2018-02-23 MED ORDER — CLOPIDOGREL BISULFATE 75 MG PO TABS
75.0000 mg | ORAL_TABLET | Freq: Every day | ORAL | Status: DC
  Administered 2018-02-23: 75 mg via ORAL
  Filled 2018-02-23: qty 1

## 2018-02-23 NOTE — Progress Notes (Signed)
ECHO tech seen on the department, requested pt be put on the list as hes ordered anticipated discharge.

## 2018-02-23 NOTE — Consult Note (Addendum)
Cardiology Consultation:   Patient ID: Jon Anderson; [id number]; 10-Aug-1950   Admit date: 02/22/2018 Date of Consult: 02/23/2018  Primary Care Provider: [name], MD Primary Cardiologist: Gwenlyn Found Primary Electrophysiologist:  None  Chief Complaint: Vertell Limber  Patient Profile:   Jon Anderson is a 67 y.o. male with coronary artery disease status-post LIMA-LAD (with patent graft by recent coronary angiogram 04/2017), end-stage renal disease on hemodialysis via left upper extremity AVF, chronic lower back pain, peripheral vascular disease status-post left lower extremity intervention, hypertension, who is being seen today for the evaluation of dyspnea with abnormal troponin at the request of [place].  History of Present Illness:   Jon Anderson reports that over the past 48 hours he has developed worsening dyspnea that prompted him to seek attention in the emergency department. The dyspnea is worsened by laying down flat. It has been accompanied by lower extremity edema. He has also had some abdominal bloating. He denies any significant degree of chest discomfort. He is currently without any chest pain. Of note the patient has chronic low back pain. He did not participate in his normally scheduled hemodialysis session this Wednesday due to the back pain, and only participated in half of his scheduled session today.   Past Medical History:  Diagnosis Date  . Anemia of chronic disease   . CAD (coronary artery disease)    a.  Myoview 4/11: EF 53%, no scar or ischemia   c. MV 2012 Nl perfusion, apical thinning.  No ischemia or scar.  EF 49%, appears greater by visual estimate.;  d.  Dob stress echo 12/13:  Negative Dob stress echo. There is no evidence of ischemia.  The LVF is normal. b. Normal cors 2016.  . Carotid stenosis    a. <92% RICA, >42% LICA by duplex 09/8339  . Chronic chest pain    occ  . ESRD (end stage renal disease) on dialysis ([place])    M-W-F  . GERD  (gastroesophageal reflux disease)   . HNP (herniated nucleus pulposus), lumbar   . HTN (hypertension)    echo 3/10: EF 60%, LAE  . Hyperlipidemia   . Nephrolithiasis    "passed them all"  . Peripheral arterial disease ([place])    a. s/p PTCA to L SFA.  Marland Kitchen Pneumonia   . Restless legs   . Sleep apnea    no cpap, needs to reschedule appointment to set up aquiring cpap  . Snores    a. presumed OSA, pt has refused sleep eval in past.  . Type II diabetes mellitus ([place])    no longer on medications, checks blood glucose at home    Past Surgical History:  Procedure Laterality Date  . ANGIOPLASTY / STENTING FEMORAL Left 12/11/2013   dr berry  . AV FISTULA PLACEMENT Left 03/19/2014   Procedure: CREATION OF ARTERIOVENOUS (AV) FISTULA  LEFT UPPER ARM;  Surgeon: Mal Misty, MD;  Location: [place] [facility];  Service: Vascular;  Laterality: Left;  . CARDIAC CATHETERIZATION  2001 and 2010   . COLONOSCOPY W/ BIOPSIES AND POLYPECTOMY    . COLONOSCOPY WITH PROPOFOL N/A 08/01/2016   Procedure: COLONOSCOPY WITH PROPOFOL;  Surgeon: Carol Ada, MD;  Location: [facility];  Service: Endoscopy;  Laterality: N/A;  . ESOPHAGOGASTRODUODENOSCOPY (EGD) WITH PROPOFOL N/A 08/01/2016   Procedure: ESOPHAGOGASTRODUODENOSCOPY (EGD) WITH PROPOFOL;  Surgeon: Carol Ada, MD;  Location: [facility];  Service: Endoscopy;  Laterality: N/A;  . FOOT FRACTURE SURGERY Right   . FRACTURE SURGERY    .  INGUINAL HERNIA REPAIR Left   . LEFT HEART CATH AND CORS/GRAFTS ANGIOGRAPHY N/A 04/27/2017   Procedure: LEFT HEART CATH AND CORS/GRAFTS ANGIOGRAPHY;  Surgeon: Leonie Man, MD;  Location: [facility];  Service: Cardiovascular;  Laterality: N/A;  . LEFT HEART CATHETERIZATION WITH CORONARY ANGIOGRAM N/A 06/22/2014   Procedure: LEFT HEART CATHETERIZATION WITH CORONARY ANGIOGRAM;  Surgeon: Troy Sine, MD;  Location: [facility] C[facility] LAB;  Service: Cardiovascular;  Laterality: N/A;  . LOWER EXTREMITY ANGIOGRAM Left 12/11/2013   Procedure:  LOWER EXTREMITY ANGIOGRAM;  Surgeon: Lorretta Harp, MD;  Location[facility] C[facility];  Service: Cardiovascular;  Laterality: Left;  . LUMBAR LAMINECTOMY/DECOMPRESSION MICRODISCECTOMY Right 07/03/2017   Procedure: MICRODISCECTOMY LUMBAR FIVE - SACRAL ONE RIGHT;  Surgeon: Consuella Lose, MD;  Location: [place];  Service: Neurosurgery;  Laterality: Right;  . LUMBAR LAMINECTOMY/DECOMPRESSION MICRODISCECTOMY Right 10/19/2017   Procedure: MICRODISCECTOMY LUMBAR FIVE- SACRAL 1 ONE ;  Surgeon: Consuella Lose, MD;  Location: [place];  Service: Neurosurgery;  Laterality: Right;  . TONSILLECTOMY AND ADENOIDECTOMY       Inpatient Medications: Scheduled Meds: . iopamidol       Continuous Infusions:  PRN Meds:   Home Meds: Prior to Admission medications   Medication Sig Start Date End Date Taking? Authorizing Provider  acetaminophen (TYLENOL) 500 MG tablet Take 1,000 mg by mouth 2 (two) times daily as needed for moderate pain or headache.   Yes [provider]  aspirin EC 81 MG tablet Take 1 tablet (81 mg total) by mouth daily. 05/28/12  Yes Weaver, Scott T, PA-C  atorvastatin (LIPITOR) 40 MG tablet Take 1 tablet (40 mg total) by mouth daily at 6 PM. 12/13/13  Yes Brett Canales, PA-C  calcium acetate (PHOSLO) 667 MG capsule Take 3 capsules (2,001 mg total) by mouth 3 (three) times daily with meals. Patient taking differently: Take 1,334 mg by mouth 2 (two) times daily with a meal.  10/09/15  Yes Short, Noah Delaine, MD  clopidogrel (PLAVIX) 75 MG tablet Take 1 tablet (75 mg total) by mouth daily with breakfast. 07/08/17  Yes Consuella Lose, MD  cyclobenzaprine (FLEXERIL) 5 MG tablet Take 1 tablet (5 mg total) by mouth 3 (three) times daily as needed for muscle spasms. 02/17/18  Yes Drenda Freeze, MD  diclofenac (VOLTAREN) 75 MG EC tablet Take 75 mg by mouth 2 (two) times daily.   Yes [provider]  gabapentin (NEURONTIN) 100 MG capsule Take 100 mg by mouth 2 (two) times daily.   Yes  [provider]  multivitamin (RENA-VIT) TABS tablet Take 1 tablet by mouth at bedtime. 06/23/14  Yes Domenic Polite, MD  nitroGLYCERIN (NITROSTAT) 0.4 MG SL tablet Place 1 tablet (0.4 mg total) under the tongue every 5 (five) minutes as needed for chest pain. 03/20/12  Yes Barrett, Evelene Croon, PA-C  oxyCODONE (OXY IR/ROXICODONE) 5 MG immediate release tablet Take 1 tablet (5 mg total) by mouth every 4 (four) hours as needed for moderate pain or severe pain ((score 4 to 6)). 10/20/17  Yes Elsner, Mallie Mussel, MD  pantoprazole (PROTONIX) 40 MG tablet Take 40 mg by mouth daily.   Yes [provider]  polyethylene glycol (MIRALAX / GLYCOLAX) packet Take 17 g by mouth daily as needed for moderate constipation. 03/22/17  Yes Aline August, MD  rOPINIRole (REQUIP) 0.5 MG tablet Take 0.5 mg by mouth 2 (two) times daily.    Yes [provider]  traMADol (ULTRAM) 50 MG tablet Take 50 mg by mouth every  6 (six) hours as needed for moderate pain.  02/06/18  Yes [provider]    Allergies:    Allergies  Allergen Reactions  . Kiwi Extract Swelling and Other (See Comments)    Facial swelling   . Tape Other (See Comments)    Plastic tape-blistering    Social History:   Social History   Socioeconomic History  . Marital status: Married    Spouse name: Not on file  . Number of children: Not on file  . Years of education: Not on file  . Highest education level: Not on file  Occupational History  . Not on file  Social Needs  . Financial resource strain: Not on file  . Food insecurity:    Worry: Not on file    Inability: Not on file  . Transportation needs:    Medical: Not on file    Non-medical: Not on file  Tobacco Use  . Smoking status: Former Smoker    Packs/day: 1.00    Years: 2.00    Pack years: 2.00    Types: Cigarettes  . Smokeless tobacco: Never Used  . Tobacco comment: quit smoking 40 yrs ago  Substance and Sexual Activity  . Alcohol use: No     Alcohol/week: 0.0 standard drinks  . Drug use: No  . Sexual activity: Yes    Birth control/protection: None  Lifestyle  . Physical activity:    Days per week: Not on file    Minutes per session: Not on file  . Stress: Not on file  Relationships  . Social connections:    Talks on phone: Not on file    Gets together: Not on file    Attends religious service: Not on file    Active member of club or organization: Not on file    Attends meetings of clubs or organizations: Not on file    Relationship status: Not on file  . Intimate partner violence:    Fear of current or ex partner: Not on file    Emotionally abused: Not on file    Physically abused: Not on file    Forced sexual activity: Not on file  Other Topics Concern  . Not on file  Social History Narrative   The patient is a Retail buyer.  He is married and has 2 grown children.  Lives in [place] with his wife.  Denies tobacco, alcohol or IV drug abuse or marijuana or cocaine intake.     Family History:   The patient's family history includes Cancer in his mother; Cirrhosis in his father; Diabetes in his brother and father; Heart attack in his sister. There is no history of Esophageal cancer, Colon cancer, Pancreatic cancer, or Stomach cancer.  ROS:  Please see the history of present illness.  All other ROS reviewed and negative.     Physical Exam/Data:   Vitals:   02/22/18 1926 02/22/18 1934 02/23/18 0023  BP: (!) 181/95 (!) 164/91 (!) 169/100  Pulse: 76 78 79  Resp:  15 12  Temp:  98.1 F (36.7 C)   TempSrc:  Oral   SpO2: 97% 97% 98%   No intake or output data in the 24 hours ending 02/23/18 0024 There were no vitals filed for this visit. There is no height or weight on file to calculate BMI.  General: Well developed, well nourished, in no acute distress. Head: Normocephalic, atraumatic, sclera non-icteric, no xanthomas, nares are without discharge.  Neck: Negative for carotid bruits. JVD elevated. Lungs:  Clear  bilaterally to auscultation without wheezes, rales, or rhonchi. Breathing is unlabored. Heart: RRR with S1 S2. No murmurs, rubs, or gallops appreciated. Abdomen: Soft, non-tender, non-distended with normoactive bowel sounds. No hepatomegaly. No rebound/guarding. No obvious abdominal masses. Msk:  Strength and tone appear normal for age. Extremities: No clubbing or cyanosis. Mild lower extremity edema. LUE AVF.  Distal pedal pulses are 2+ and equal bilaterally. Neuro: Alert and oriented X 3. No facial asymmetry. No focal deficit. Moves all extremities spontaneously. Psych:  Responds to questions appropriately with a normal affect.  EKG:  The EKG was personally reviewed and demonstrates normal sinus rhythm no ischemic ST/T changes  Relevant CV Studies:  Cardiac catheterization from 04/2017 reviewed. Proximal LAD lesion 75%. LIMA-LAD widely patent. LVEDP 0-3 mmHg. Non-obstructive disease in other coronary arteries.  TTE from 03/2017 reviewed. LVEF=55%. Moderate LVH. Severe mitral annular calcification with mild mitral stenosis. Severe LA dilatation. Mild TR.  Nuclear stress test from 12/2013 personally reviewed. No evidence of ischemia. LVEF = 55%. Low risk study.   Laboratory Data:  Chemistry Recent Labs  Lab 02/17/18 1618 02/22/18 1945  NA 133* 132*  K 3.9 4.4  CL 94* 91*  CO2 25 29  GLUCOSE 116* 85  BUN 76* 38*  CREATININE 15.02* 11.71*  CALCIUM 8.3* 8.4*  GFRNONAA 3* 4*  GFRAA 3* 4*  ANIONGAP 14 12    Recent Labs  Lab 02/17/18 1618  PROT 6.2*  ALBUMIN 3.7  AST 9*  ALT 13  ALKPHOS 51  BILITOT 0.6   Hematology Recent Labs  Lab 02/17/18 1618 02/22/18 1945  WBC 8.2 6.0  RBC 3.88* 3.97*  HGB 11.7* 11.9*  HCT 37.7* 37.9*  MCV 97.2 95.5  MCH 30.2 30.0  MCHC 31.0 31.4  RDW 13.2 12.9  PLT 259 216   Cardiac EnzymesNo results for input(s): TROPONINI in the last 168 hours.  Recent Labs  Lab 02/22/18 1951 02/22/18 2334  TROPIPOC 0.11* 0.13*    BNP Recent Labs    Lab 02/22/18 1945  BNP 1,612.8*    DDimer No results for input(s): DDIMER in the last 168 hours.  Radiology/Studies:  Dg Chest 2 View  Result Date: 02/22/2018 CLINICAL DATA:  Shortness of breath when laying flat. EXAM: CHEST - 2 VIEW COMPARISON:  04/27/2017 FINDINGS: There is no focal parenchymal opacity. There is no pleural effusion or pneumothorax. The heart and mediastinal contours are unremarkable. There is thoracic aortic atherosclerosis. The osseous structures are unremarkable. IMPRESSION: No active cardiopulmonary disease. Electronically Signed   By: Kathreen Devoid   On: 02/22/2018 20:20   Ct Angio Chest Pe W/cm &/or Wo Cm  Result Date: 02/22/2018 CLINICAL DATA:  Shortness of breath, chest pain, back pain EXAM: CT ANGIOGRAPHY CHEST WITH CONTRAST TECHNIQUE: Multidetector CT imaging of the chest was performed using the standard protocol during bolus administration of intravenous contrast. Multiplanar CT image reconstructions and MIPs were obtained to evaluate the vascular anatomy. CONTRAST:  172mL ISOVUE-370 IOPAMIDOL (ISOVUE-370) INJECTION 76% COMPARISON:  None. FINDINGS: Cardiovascular: Satisfactory opacification of the pulmonary arteries to the segmental level. No evidence of pulmonary embolism. Normal heart size. No pericardial effusion. Thoracic aortic. Coronary artery atherosclerosis in the left main, lad, circumflex, first diagonal branch and RCA. Mediastinum/Nodes: No enlarged mediastinal, hilar, or axillary lymph nodes. Thyroid gland, trachea, and esophagus demonstrate no significant findings. Lungs/Pleura: No focal consolidation. No pleural effusion or pneumothorax. Upper Abdomen: No acute upper abdominal abnormality. Musculoskeletal: No acute osseous abnormality. No aggressive osseous lesion. Mild degenerative disease of  the thoracic spine. Other: Bilateral retroareolar fibroglandular soft tissue as can be seen with gynecomastia. Review of the MIP images confirms the above findings.  IMPRESSION: 1. No acute cardiopulmonary disease. 2.  Aortic Atherosclerosis (ICD10-I70.0). 3. Multivessel coronary artery atherosclerosis. Electronically Signed   By: Kathreen Devoid   On: 02/22/2018 22:56    Assessment and Plan:   BRITTANY AMIRAULT is a 67 y.o. male with coronary artery disease status-post LIMA-LAD (with patent graft by recent coronary angiogram 04/2017), end-stage renal disease on hemodialysis via left upper extremity AVF, chronic lower back pain, peripheral vascular disease status-post left lower extremity intervention, hypertension, who is being seen today for the evaluation of dyspnea with abnormal troponin at the request of [place].  1. Dyspnea, orthopnea. This is most likely due to volume overload in the setting of inadequate hemodialysis. I would anticipate that the patient is under-dialyzed having missed session on Wednesday and only participated in half of his scheduled session today. This is corroborated by some low grade swelling in his lower extremities and elevated jugular venous pressure. I would engage nephrology to see if we can remove volume here with analgesia to control his back pain in the chair. He may need some medical management of his hypertension until he can be dialyzed.  2. Elevated troponin. This has a flat trend. I think that this is not due to an acute coronary syndrome but rather due to elevated LVEDP and missed dialysis. He has no chest pain or acute ST-T changes. I would not add heparin here but would continue his home medications including his aspirin, clopidogrel, atorvastatin. We will continue to follow along with you.   For questions or updates, please contact [place] Please consult [email or web address] for contact info under Cardiology/STEMI.    Signed, Perley Jain, MD  02/23/2018 12:24 AM

## 2018-02-23 NOTE — Discharge Summary (Addendum)
Discharge Summary  Jon Anderson YNW:[id number] DOB: 01/16/1951  PCP: [name], MD  Admit date: 02/22/2018 Discharge date: 02/23/2018  Time spent: 35 minutes  Recommendations for Outpatient Follow-up:   1. Pain management for his chronic back pain 2. Nephrology for hemodialysis 3. Primary care provider to follow-up on 2D echo report and follow-up with cardiology/referral to cardiology  Discharge Diagnoses:  Active Hospital Problems   Diagnosis Date Noted  . Elevated troponin 02/23/2018  . Orthopnea 02/23/2018  . ESRD (end stage renal disease) on dialysis ([place]) 03/17/2017  . 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 Lumbar radiculopathy 01/16/2017  . CAD (coronary artery disease) nonobstructive per cath 2012   . Precordial chest pain 07/26/2009  . Hypertension 07/22/2009    Resolved Hospital Problems  No resolved problems to display.    Discharge Condition: Improved  Diet recommendation: Renal  Vitals:   02/23/18 0816 02/23/18 1206  BP: 136/87 124/84  Pulse: 73 70  Resp: 16 15  Temp: 97.8 F (36.6 C) 97.8 F (36.6 C)  SpO2: 98% 96%    History of present illness:  Patient was admitted with mild chest pain but mostly shortness of breath he underwent CTA that was negative for pulmonary embolism.  He had elevated troponin of 0.09 and 0.10.  Cardiology was consulted.  He did not feel that this needed intervention and felt that this was due to his chronic renal failure.  It was noted that he was not able to complete his hemodialysis due to severe back pain cannot stay still for prolonged hemodialysis.  He is scheduled to have back surgery March 15, 2018.  Patient was discharged with oxycodone for 5 days total of #40 he will continue his other medications for back pain  Hospital Course:  Principal Problem:   Elevated troponin Active Problems:   Hypertension   Precordial chest pain   CAD (coronary artery disease) nonobstructive per cath 2012   Lumbar  radiculopathy   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/[facility]   ESRD (end stage renal disease) on dialysis [facility])   Orthopnea   Procedures:   Hemodialysis  Echocardiogram  Consultations:  Nephrology  Cardiology  Discharge Exam: BP 124/84 (BP Location: Right Arm)   Pulse 70   Temp 97.8 F (36.6 C) (Oral)   Resp 15   Ht 5\' 11"  (1.803 m)   Wt 71.8 kg Comment: scale a  SpO2 96%   BMI 22.09 kg/m   General: Well-appearing in no distress Cardiovascular: Regular rate and rhythm no murmur Respiratory: CTA bilaterally Extremity did not show any edema Abdomen soft nontender no hepatosplenomegaly Groin examination reveals a well-healed left inguinal scar.  There was no scrotal swelling or edema  Discharge Instructions You were cared for by a hospitalist during your hospital stay. If you have any questions about your discharge medications or the care you received while you were in the hospital after you are discharged, you can call the unit and asked to speak with the hospitalist on call if the hospitalist that took care of you is not available. Once you are discharged, your primary care physician will handle any further medical issues. Please note that NO REFILLS for any discharge medications will be authorized once you are discharged, as it is imperative that you return to your primary care physician (or establish a relationship with a primary care physician if you do not have one) for your aftercare needs so that they can reassess your need for medications and monitor your lab values.  Discharge Instructions    Diet - low sodium heart healthy   Complete by:  As directed    Renal   Discharge instructions   Complete by:  As directed    Low up with primary care provider as well as pain management for management of chronic back pain and possibly change of narcotic treatment plan   Increase activity slowly   Complete by:  As directed      Allergies as of 02/23/2018      Reactions   Kiwi Extract  Swelling, Other (See Comments)   Facial swelling    Tape Other (See Comments)   Plastic tape-blistering      Medication List    STOP taking these medications   traMADol 50 MG tablet Commonly known as:  ULTRAM     TAKE these medications   acetaminophen 500 MG tablet Commonly known as:  TYLENOL Take 1,000 mg by mouth 2 (two) times daily as needed for moderate pain or headache.   aspirin EC 81 MG tablet Take 1 tablet (81 mg total) by mouth daily.   atorvastatin 40 MG tablet Commonly known as:  LIPITOR Take 1 tablet (40 mg total) by mouth daily at 6 PM.   calcium acetate 667 MG capsule Commonly known as:  PHOSLO Take 3 capsules (2,001 mg total) by mouth 3 (three) times daily with meals. What changed:    how much to take  when to take this   clopidogrel 75 MG tablet Commonly known as:  PLAVIX Take 1 tablet (75 mg total) by mouth daily with breakfast.   cyclobenzaprine 5 MG tablet Commonly known as:  FLEXERIL Take 1 tablet (5 mg total) by mouth 3 (three) times daily as needed for muscle spasms.   diclofenac 75 MG EC tablet Commonly known as:  VOLTAREN Take 75 mg by mouth 2 (two) times daily.   gabapentin 100 MG capsule Commonly known as:  NEURONTIN Take 100 mg by mouth 2 (two) times daily.   multivitamin Tabs tablet Take 1 tablet by mouth at bedtime.   nitroGLYCERIN 0.4 MG SL tablet Commonly known as:  NITROSTAT Place 1 tablet (0.4 mg total) under the tongue every 5 (five) minutes as needed for chest pain.   oxyCODONE 5 MG immediate release tablet Commonly known as:  Oxy IR/ROXICODONE Take 1 tablet (5 mg total) by mouth every 4 (four) hours as needed for up to 5 days for moderate pain or severe pain ((score 4 to 6)).   pantoprazole 40 MG tablet Commonly known as:  PROTONIX Take 40 mg by mouth daily.   polyethylene glycol packet Commonly known as:  MIRALAX / GLYCOLAX Take 17 g by mouth daily as needed for moderate constipation.   rOPINIRole 0.5 MG  tablet Commonly known as:  REQUIP Take 0.5 mg by mouth 2 (two) times daily.      Allergies  Allergen Reactions  . Kiwi Extract Swelling and Other (See Comments)    Facial swelling   . Tape Other (See Comments)    Plastic tape-blistering      The results of significant diagnostics from this hospitalization (including imaging, microbiology, ancillary and laboratory) are listed below for reference.    Significant Diagnostic Studies: Dg Chest 2 View  Result Date: 02/22/2018 CLINICAL DATA:  Shortness of breath when laying flat. EXAM: CHEST - 2 VIEW COMPARISON:  04/27/2017 FINDINGS: There is no focal parenchymal opacity. There is no pleural effusion or pneumothorax. The heart and mediastinal contours are unremarkable. There is thoracic  aortic atherosclerosis. The osseous structures are unremarkable. IMPRESSION: No active cardiopulmonary disease. Electronically Signed   By: Kathreen Devoid   On: 02/22/2018 20:20   Dg Lumbar Spine Complete  Result Date: 02/17/2018 CLINICAL DATA:  67 y/o M; Three months of back pain. Increasing difficulty walking. EXAM: LUMBAR SPINE - COMPLETE 4+ VIEW COMPARISON:  01/13/2018 lumbar spine MRI. 06/02/2016 lumbar spine radiographs. FINDINGS: Five lumbar type non-rib-bearing vertebral bodies. Mild lumbar dextrocurvature with apex at L3. L4-5 grade 1 anterolisthesis is stable.Moderate loss of intervertebral disc space height at the L4-5 and L5-S1 levels. Mild loss of intervertebral disc space height at L3-4. No loss of vertebral body height. No acute fracture. Calcific atherosclerosis of the abdominal aorta. Lower anterior abdominal wall mesh hernia repair anchors noted. IMPRESSION: 1. No acute fracture or dislocation. 2. Stable grade 1 L4-5 anterolisthesis. Mild lumbar dextrocurvature. 3. Stable lumbar spondylosis greatest at L4-5 and L5-S1 levels. Electronically Signed   By: Kristine Garbe M.D.   On: 02/17/2018 17:18   Ct Angio Chest Pe W/cm &/or Wo  Cm  Result Date: 02/22/2018 CLINICAL DATA:  Shortness of breath, chest pain, back pain EXAM: CT ANGIOGRAPHY CHEST WITH CONTRAST TECHNIQUE: Multidetector CT imaging of the chest was performed using the standard protocol during bolus administration of intravenous contrast. Multiplanar CT image reconstructions and MIPs were obtained to evaluate the vascular anatomy. CONTRAST:  138mL ISOVUE-370 IOPAMIDOL (ISOVUE-370) INJECTION 76% COMPARISON:  None. FINDINGS: Cardiovascular: Satisfactory opacification of the pulmonary arteries to the segmental level. No evidence of pulmonary embolism. Normal heart size. No pericardial effusion. Thoracic aortic. Coronary artery atherosclerosis in the left main, lad, circumflex, first diagonal branch and RCA. Mediastinum/Nodes: No enlarged mediastinal, hilar, or axillary lymph nodes. Thyroid gland, trachea, and esophagus demonstrate no significant findings. Lungs/Pleura: No focal consolidation. No pleural effusion or pneumothorax. Upper Abdomen: No acute upper abdominal abnormality. Musculoskeletal: No acute osseous abnormality. No aggressive osseous lesion. Mild degenerative disease of the thoracic spine. Other: Bilateral retroareolar fibroglandular soft tissue as can be seen with gynecomastia. Review of the MIP images confirms the above findings. IMPRESSION: 1. No acute cardiopulmonary disease. 2.  Aortic Atherosclerosis (ICD10-I70.0). 3. Multivessel coronary artery atherosclerosis. Electronically Signed   By: Kathreen Devoid   On: 02/22/2018 22:56    Microbiology: No results found for this or any previous visit (from the past 240 hour(s)).   Labs: Basic Metabolic Panel: Recent Labs  Lab 02/17/18 1618 02/22/18 1945  NA 133* 132*  K 3.9 4.4  CL 94* 91*  CO2 25 29  GLUCOSE 116* 85  BUN 76* 38*  CREATININE 15.02* 11.71*  CALCIUM 8.3* 8.4*   Liver Function Tests: Recent Labs  Lab 02/17/18 1618  AST 9*  ALT 13  ALKPHOS 51  BILITOT 0.6  PROT 6.2*  ALBUMIN 3.7    No results for input(s): LIPASE, AMYLASE in the last 168 hours. No results for input(s): AMMONIA in the last 168 hours. CBC: Recent Labs  Lab 02/17/18 1618 02/22/18 1945  WBC 8.2 6.0  NEUTROABS 4.4 2.8  HGB 11.7* 11.9*  HCT 37.7* 37.9*  MCV 97.2 95.5  PLT 259 216   Cardiac Enzymes: Recent Labs  Lab 02/23/18 0052 02/23/18 0616 02/23/18 1208  TROPONINI 0.09* 0.10* 0.09*   BNP: BNP (last 3 results) Recent Labs    03/17/17 1124 02/22/18 1945  BNP 3,394.7* 1,612.8*    ProBNP (last 3 results) No results for input(s): PROBNP in the last 8760 hours.  CBG: No results for input(s): GLUCAP in the last  168 hours.     Signed:  Cristal Deer, MD [facility] 02/23/2018, 1:46 PM

## 2018-02-23 NOTE — Progress Notes (Addendum)
Reviewed note from Cardiology earlier this am.  Admitted with worsening DOE, PND, orthopnea, LE edema and abdominal bloating.  He denies any CP.  Cxray showed no CHF. Trop minimally elevated with flat trend and likely related to ESRD.  BNP elevated at 1612. Hbg 11.9.  EKG unchanged from prior.  Feels much better this am.  Cath a year ago showed patent LIMA to LAD and moderate nonobstructive dz of LCx. BP well controlled this am at 136/70mmHg.  Feels much better.  COntinue ASA, statin.  Repeat 2D echo to make sure no change in LVF. Suspect that patient was under-dialyzed having missed session on Wednesday and only participated in half of his scheduled session yesterday.  Nephrology evaluated this am and feels not volume overloaded and next HD on Monday.

## 2018-02-23 NOTE — Progress Notes (Signed)
Patient resting comfortably during shift report. Denies complaints.  

## 2018-02-23 NOTE — Progress Notes (Signed)
Call placed to CCMD to notify of telemetry monitoring d/c.   

## 2018-02-23 NOTE — Progress Notes (Signed)
Pt has a Troponin of 0.9.  BP is high, 160s - 180s/ 90s - 110s. No PRN or parameters on file. NP on call was notified. Will continue to monitor.

## 2018-02-23 NOTE — Consult Note (Addendum)
[facility] Renal Consultation Note  Indication for Consultation:  Management of ESRD/hemodialysis; anemia, hypertension/volume and secondary hyperparathyroidism  HPI: Jon Anderson is a 67 y.o. male with ESRD 2/2 HTN, started HD 06/2014. Dialyzes MWF at [facility]  With  PMH includes, HTN, PVD, Type 2 DM, non-obstructive CAD on LHC (06/2014), Hx nephrolithiasis, COPD, GERD, L carotid stenosis (2014). S/p CABG 01/30/17 at [facility] and graft patent by cath Jan 2019,  Recent problems with significant progressive  Back pain (' with ho prior surg.")with Surgery schedule for  "Dec 5"   Present in ER co orthopnea ,back pain (Tramadol not helping) He reports trouble walking 2/2 back pain,missed past wed 11/13 HD sec pain but had 2 hrs of 4 hrs hd yest 11/15  ("too painful to sit in chair 4hr") . CXR  And CT Chest = NAD  Bun 38 CR 11.71  K 4.4  hgb 11.9  Tropon 0.09 .0.10   He denies fevers, chills ,,cp, sob now , cough, N/V/D appetite ok  , feet always cold but no wounds. Lives with wife.     Past Medical History:  Diagnosis Date  . Anemia of chronic disease   . CAD (coronary artery disease)    a.  Myoview 4/11: EF 53%, no scar or ischemia   c. MV 2012 Nl perfusion, apical thinning.  No ischemia or scar.  EF 49%, appears greater by visual estimate.;  d.  Dob stress echo 12/13:  Negative Dob stress echo. There is no evidence of ischemia.  The LVF is normal. b. Normal cors 2016.  . Carotid stenosis    a. <27% RICA, >74% LICA by duplex 04/2876  . Chronic chest pain    occ  . ESRD (end stage renal disease) on dialysis ([place])    M-W-F  . GERD (gastroesophageal reflux disease)   . HNP (herniated nucleus pulposus), lumbar   . HTN (hypertension)    echo 3/10: EF 60%, LAE  . Hyperlipidemia   . Nephrolithiasis    "passed them all"  . Peripheral arterial disease ([place])    a. s/p PTCA to L SFA.  Marland Kitchen Pneumonia   . Restless legs   . Sleep apnea    no cpap, needs to  reschedule appointment to set up aquiring cpap  . Snores    a. presumed OSA, pt has refused sleep eval in past.  . Type II diabetes mellitus ([place])    no longer on medications, checks blood glucose at home    Past Surgical History:  Procedure Laterality Date  . ANGIOPLASTY / STENTING FEMORAL Left 12/11/2013   dr berry  . AV FISTULA PLACEMENT Left 03/19/2014   Procedure: CREATION OF ARTERIOVENOUS (AV) FISTULA  LEFT UPPER ARM;  Surgeon: Mal Misty, MD;  Location: [place];  Service: Vascular;  Laterality: Left;  . CARDIAC CATHETERIZATION  2001 and 2010   . COLONOSCOPY W/ BIOPSIES AND POLYPECTOMY    . COLONOSCOPY WITH PROPOFOL N/A 08/01/2016   Procedure: COLONOSCOPY WITH PROPOFOL;  Surgeon: Carol Ada, MD;  Location: [facility];  Service: Endoscopy;  Laterality: N/A;  . ESOPHAGOGASTRODUODENOSCOPY (EGD) WITH PROPOFOL N/A 08/01/2016   Procedure: ESOPHAGOGASTRODUODENOSCOPY (EGD) WITH PROPOFOL;  Surgeon: Carol Ada, MD;  Location: [facility];  Service: Endoscopy;  Laterality: N/A;  . FOOT FRACTURE SURGERY Right   . FRACTURE SURGERY    . INGUINAL HERNIA REPAIR Left   . LEFT HEART CATH AND CORS/GRAFTS ANGIOGRAPHY N/A 04/27/2017   Procedure: LEFT HEART CATH AND  CORS/GRAFTS ANGIOGRAPHY;  Surgeon: Leonie Man, MD;  Location: [place] CV LAB;  Service: Cardiovascular;  Laterality: N/A;  . LEFT HEART CATHETERIZATION WITH CORONARY ANGIOGRAM N/A 06/22/2014   Procedure: LEFT HEART CATHETERIZATION WITH CORONARY ANGIOGRAM;  Surgeon: Troy Sine, MD;  Location: [facility] CATH LAB;  Service: Cardiovascular;  Laterality: N/A;  . LOWER EXTREMITY ANGIOGRAM Left 12/11/2013   Procedure: LOWER EXTREMITY ANGIOGRAM;  Surgeon: Lorretta Harp, MD;  Location: [facility] CATH LAB;  Service: Cardiovascular;  Laterality: Left;  . LUMBAR LAMINECTOMY/DECOMPRESSION MICRODISCECTOMY Right 07/03/2017   Procedure: MICRODISCECTOMY LUMBAR FIVE - SACRAL ONE RIGHT;  Surgeon: Consuella Lose, MD;  Location: [place];  Service:  Neurosurgery;  Laterality: Right;  . LUMBAR LAMINECTOMY/DECOMPRESSION MICRODISCECTOMY Right 10/19/2017   Procedure: MICRODISCECTOMY LUMBAR FIVE- SACRAL 1 ONE ;  Surgeon: Consuella Lose, MD;  Location: [place];  Service: Neurosurgery;  Laterality: Right;  . TONSILLECTOMY AND ADENOIDECTOMY        Family History  Problem Relation Age of Onset  . Heart attack Sister        died @ 32  . Cancer Mother        died @ 74; unknown type  . Diabetes Brother        deceased  . Cirrhosis Father        alcohol related  . Diabetes Father   . Esophageal cancer Neg Hx   . Colon cancer Neg Hx   . Pancreatic cancer Neg Hx   . Stomach cancer Neg Hx       reports that he has quit smoking. His smoking use included cigarettes. He has a 2.00 pack-year smoking history. He has never used smokeless tobacco. He reports that he does not drink alcohol or use drugs.   Allergies  Allergen Reactions  . Kiwi Extract Swelling and Other (See Comments)    Facial swelling   . Tape Other (See Comments)    Plastic tape-blistering    Prior to Admission medications   Medication Sig Start Date End Date Taking? Authorizing Provider  acetaminophen (TYLENOL) 500 MG tablet Take 1,000 mg by mouth 2 (two) times daily as needed for moderate pain or headache.   Yes [provider]  aspirin EC 81 MG tablet Take 1 tablet (81 mg total) by mouth daily. 05/28/12  Yes Weaver, Scott T, PA-C  atorvastatin (LIPITOR) 40 MG tablet Take 1 tablet (40 mg total) by mouth daily at 6 PM. 12/13/13  Yes Brett Canales, PA-C  calcium acetate (PHOSLO) 667 MG capsule Take 3 capsules (2,001 mg total) by mouth 3 (three) times daily with meals. Patient taking differently: Take 1,334 mg by mouth 2 (two) times daily with a meal.  10/09/15  Yes Short, Noah Delaine, MD  clopidogrel (PLAVIX) 75 MG tablet Take 1 tablet (75 mg total) by mouth daily with breakfast. 07/08/17  Yes Consuella Lose, MD  cyclobenzaprine (FLEXERIL) 5 MG tablet Take 1 tablet (5  mg total) by mouth 3 (three) times daily as needed for muscle spasms. 02/17/18  Yes Drenda Freeze, MD  diclofenac (VOLTAREN) 75 MG EC tablet Take 75 mg by mouth 2 (two) times daily.   Yes [provider]  gabapentin (NEURONTIN) 100 MG capsule Take 100 mg by mouth 2 (two) times daily.   Yes [provider]  multivitamin (RENA-VIT) TABS tablet Take 1 tablet by mouth at bedtime. 06/23/14  Yes Domenic Polite, MD  nitroGLYCERIN (NITROSTAT) 0.4 MG SL tablet Place 1 tablet (0.4 mg total) under the tongue every  5 (five) minutes as needed for chest pain. 03/20/12  Yes Barrett, Evelene Croon, PA-C  oxyCODONE (OXY IR/ROXICODONE) 5 MG immediate release tablet Take 1 tablet (5 mg total) by mouth every 4 (four) hours as needed for moderate pain or severe pain ((score 4 to 6)). 10/20/17  Yes Elsner, Mallie Mussel, MD  pantoprazole (PROTONIX) 40 MG tablet Take 40 mg by mouth daily.   Yes [provider]  polyethylene glycol (MIRALAX / GLYCOLAX) packet Take 17 g by mouth daily as needed for moderate constipation. 03/22/17  Yes Aline August, MD  rOPINIRole (REQUIP) 0.5 MG tablet Take 0.5 mg by mouth 2 (two) times daily.    Yes [provider]  traMADol (ULTRAM) 50 MG tablet Take 50 mg by mouth every 6 (six) hours as needed for moderate pain.  02/06/18  Yes [provider]     Anti-infectives (From admission, onward)   None      Results for orders placed or performed during the hospital encounter of 02/22/18 (from the past 48 hour(s))  Basic metabolic panel     Status: Abnormal   Collection Time: 02/22/18  7:45 PM  Result Value Ref Range   Sodium 132 (L) 135 - 145 mmol/L   Potassium 4.4 3.5 - 5.1 mmol/L   Chloride 91 (L) 98 - 111 mmol/L   CO2 29 22 - 32 mmol/L   Glucose, Bld 85 70 - 99 mg/dL   BUN 38 (H) 8 - 23 mg/dL   Creatinine, Ser 11.71 (H) 0.61 - 1.24 mg/dL   Calcium 8.4 (L) 8.9 - 10.3 mg/dL   GFR calc non Af Amer 4 (L) >60 mL/min   GFR calc Af Amer 4 (L) >60  mL/min    Comment: (NOTE) The eGFR has been calculated using the CKD EPI equation. This calculation has not been validated in all clinical situations. eGFR's persistently <60 mL/min signify possible Chronic Kidney Disease.    Anion gap 12 5 - 15    Comment: Performed at [facility] [address], [place], [place] [address]  CBC with Differential     Status: Abnormal   Collection Time: 02/22/18  7:45 PM  Result Value Ref Range   WBC 6.0 4.0 - 10.5 K/uL   RBC 3.97 (L) 4.22 - 5.81 MIL/uL   Hemoglobin 11.9 (L) 13.0 - 17.0 g/dL   HCT 37.9 (L) 39.0 - 52.0 %   MCV 95.5 80.0 - 100.0 fL   MCH 30.0 26.0 - 34.0 pg   MCHC 31.4 30.0 - 36.0 g/dL   RDW 12.9 11.5 - 15.5 %   Platelets 216 150 - 400 K/uL   nRBC 0.0 0.0 - 0.2 %   Neutrophils Relative % 47 %   Neutro Abs 2.8 1.7 - 7.7 K/uL   Lymphocytes Relative 38 %   Lymphs Abs 2.2 0.7 - 4.0 K/uL   Monocytes Relative 10 %   Monocytes Absolute 0.6 0.1 - 1.0 K/uL   Eosinophils Relative 5 %   Eosinophils Absolute 0.3 0.0 - 0.5 K/uL   Basophils Relative 0 %   Basophils Absolute 0.0 0.0 - 0.1 K/uL   Immature Granulocytes 0 %   Abs Immature Granulocytes 0.01 0.00 - 0.07 K/uL    Comment: Performed at [facility] [address], [place],  [address]  Brain natriuretic peptide     Status: Abnormal   Collection Time: 02/22/18  7:45 PM  Result Value Ref Range   B Natriuretic Peptide 1,612.8 (H) 0.0 -  100.0 pg/mL    Comment: Performed at [facility], [place] [address], [place], [place] [address]  I-stat troponin, ED     Status: Abnormal   Collection Time: 02/22/18  7:51 PM  Result Value Ref Range   Troponin i, poc 0.11 (HH) 0.00 - 0.08 ng/mL   Comment NOTIFIED PHYSICIAN    Comment 3            Comment: Due to the release kinetics of cTnI, a negative result within the first hours of the onset of symptoms does not rule out myocardial infarction with certainty. If myocardial infarction is still suspected, repeat the test  at appropriate intervals.   I-stat troponin, ED     Status: Abnormal   Collection Time: 02/22/18 11:34 PM  Result Value Ref Range   Troponin i, poc 0.13 (HH) 0.00 - 0.08 ng/mL   Comment NOTIFIED PHYSICIAN    Comment 3            Comment: Due to the release kinetics of cTnI, a negative result within the first hours of the onset of symptoms does not rule out myocardial infarction with certainty. If myocardial infarction is still suspected, repeat the test at appropriate intervals.   Troponin I - Now Then Q6H     Status: Abnormal   Collection Time: 02/23/18 12:52 AM  Result Value Ref Range   Troponin I 0.09 (HH) <0.03 ng/mL    Comment: CRITICAL RESULT CALLED TO, READ BACK BY AND VERIFIED WITH: OLOFIN,T RN 02/23/2018 0156 JORDANS Performed at [facility], [place] [address] [place], [place] [address]   Troponin I - Now Then Q6H     Status: Abnormal   Collection Time: 02/23/18  6:16 AM  Result Value Ref Range   Troponin I 0.10 (HH) <0.03 ng/mL    Comment: CRITICAL VALUE NOTED.  VALUE IS CONSISTENT WITH PREVIOUSLY REPORTED AND CALLED VALUE. Performed at [facility], [place] [address], [place], [place] [address]     ROS: seehpi  Physical Exam: Vitals:   02/23/18 0344 02/23/18 0816  BP: (!) 150/84 136/87  Pulse: 74 73  Resp: 18 16  Temp: 97.9 F (36.6 C) 97.8 F (36.6 C)  SpO2: 98% 98%     General: alert thin  AAM , NAD , HEENT: [place] , not octeri MMM Neck: no jvd Heart: RRR ,no m,r,g  Lungs: CTA  Abdomen: BS pos ,soft, NT,ND  Extremities: NOpedal edema  Skin: Multiple scabing area lower legs ,no open wounds  Neuro: alert OX3, moves all extrem  Dialysis Access: Pos bruit LFA AVF  Dialysis Orders: Center: [facility]  on MWF . EDW 71.5  HD Bath 2k, 2ca  Profile 2  Time 4hr Heparin 5000. Access LUA AVF     Parsabiv 27m mcg IV/HD  Calcitriol 1.25  mcg po hd  Venofer 50 mg q wkly     No esa   Assessment/Plan 1. ESRD -  HD MWF , labs and vol ok ,next hd  mon   2. Hypertension/volume  - no excess vol on exam or ct/cxr , stable bp ,no op bp meds  3. BACK PAIN - per admit team  4. CAD/ ho CABG- card .following   5. Anemia  - no esa needs , fu  hgb trend  6. Metabolic bone disease -  PO vit d on hd , phos lo as binder   DErnest Haber PA-C [facility] [phone]/16/2019, 11:03 AM   Pt  seen, examined and agree w A/P as above.  ESRD pt presenting SOB but at dry wt and CXR clear, no vol^ on exam.  Main issue seems to be back issues , having trouble doing dialysis due to chronic and acute back pain.  Will d/w primary team.  Kelly Splinter MD [place] pager [phone]   02/23/2018, 1:15 PM

## 2018-02-23 NOTE — H&P (Signed)
History and Physical    Jon Anderson VQQ:[id number] DOB: 1951-01-21 DOA: 02/22/2018  PCP: [name], MD  Patient coming from: Home  I have personally briefly reviewed patient's old medical records in [place]  Chief Complaint: Orthopnea  HPI: Jon Anderson is a 66 y.o. male with medical history significant of ESRD dialysis MWF, CAD s/p LIMA-LAD CABG, graft patent by cath Jan 2019, back pain with surgery scheduled for Dec 5th.  Patient presents to the ED with c/o orthopnea.  Onset and worsening over past 48 hours.  Missed dialysis on Wed due to back pain, only had half scheduled session today due to back pain.  (Back pain is his radiculopathy that he has surgery scheduled for).   ED Course: Trop 0.11 and 0.13.  CTA neg for PE, BNP 1612.  Cards consulted and hospitalist asked to admit.  CTA neg for PE.   Review of Systems: As per HPI otherwise 10 point review of systems negative.   Past Medical History:  Diagnosis Date  . Anemia of chronic disease   . CAD (coronary artery disease)    a.  Myoview 4/11: EF 53%, no scar or ischemia   c. MV 2012 Nl perfusion, apical thinning.  No ischemia or scar.  EF 49%, appears greater by visual estimate.;  d.  Dob stress echo 12/13:  Negative Dob stress echo. There is no evidence of ischemia.  The LVF is normal. b. Normal cors 2016.  . Carotid stenosis    a. <43% RICA, >32% LICA by duplex 12/5186  . Chronic chest pain    occ  . ESRD (end stage renal disease) on dialysis ([place])    M-W-F  . GERD (gastroesophageal reflux disease)   . HNP (herniated nucleus pulposus), lumbar   . HTN (hypertension)    echo 3/10: EF 60%, LAE  . Hyperlipidemia   . Nephrolithiasis    "passed them all"  . Peripheral arterial disease ([place])    a. s/p PTCA to L SFA.  Marland Kitchen Pneumonia   . Restless legs   . Sleep apnea    no cpap, needs to reschedule appointment to set up aquiring cpap  . Snores    a. presumed OSA, pt has refused sleep eval in past.    . Type II diabetes mellitus ([place])    no longer on medications, checks blood glucose at home    Past Surgical History:  Procedure Laterality Date  . ANGIOPLASTY / STENTING FEMORAL Left 12/11/2013   dr berry  . AV FISTULA PLACEMENT Left 03/19/2014   Procedure: CREATION OF ARTERIOVENOUS (AV) FISTULA  LEFT UPPER ARM;  Surgeon: Mal Misty, MD;  Location: [facility];  Service: Vascular;  Laterality: Left;  . CARDIAC CATHETERIZATION  2001 and 2010   . COLONOSCOPY W/ BIOPSIES AND POLYPECTOMY    . COLONOSCOPY WITH PROPOFOL N/A 08/01/2016   Procedure: COLONOSCOPY WITH PROPOFOL;  Surgeon: Carol Ada, MD;  Location: [facility];  Service: Endoscopy;  Laterality: N/A;  . ESOPHAGOGASTRODUODENOSCOPY (EGD) WITH PROPOFOL N/A 08/01/2016   Procedure: ESOPHAGOGASTRODUODENOSCOPY (EGD) WITH PROPOFOL;  Surgeon: Carol Ada, MD;  Location: [facility];  Service: Endoscopy;  Laterality: N/A;  . FOOT FRACTURE SURGERY Right   . FRACTURE SURGERY    . INGUINAL HERNIA REPAIR Left   . LEFT HEART CATH AND CORS/GRAFTS ANGIOGRAPHY N/A 04/27/2017   Procedure: LEFT HEART CATH AND CORS/GRAFTS ANGIOGRAPHY;  Surgeon: Leonie Man, MD;  Location: [facility];  Service: Cardiovascular;  Laterality: N/A;  .  LEFT HEART CATHETERIZATION WITH CORONARY ANGIOGRAM N/A 06/22/2014   Procedure: LEFT HEART CATHETERIZATION WITH CORONARY ANGIOGRAM;  Surgeon: Troy Sine, MD;  Location: [facility] CATH LAB;  Service: Cardiovascular;  Laterality: N/A;  . LOWER EXTREMITY ANGIOGRAM Left 12/11/2013   Procedure: LOWER EXTREMITY ANGIOGRAM;  Surgeon: Lorretta Harp, MD;  Location: [facility] C[facility] LAB;  Service: Cardiovascular;  Laterality: Left;  . LUMBAR LAMINECTOMY/DECOMPRESSION MICRODISCECTOMY Right 07/03/2017   Procedure: MICRODISCECTOMY LUMBAR FIVE - SACRAL ONE RIGHT;  Surgeon: Consuella Lose, MD;  Location: [facility];  Service: Neurosurgery;  Laterality: Right;  . LUMBAR LAMINECTOMY/DECOMPRESSION MICRODISCECTOMY Right 10/19/2017   Procedure:  MICRODISCECTOMY LUMBAR FIVE- SACRAL 1 ONE ;  Surgeon: Consuella Lose, MD;  Location: [facility];  Service: Neurosurgery;  Laterality: Right;  . TONSILLECTOMY AND ADENOIDECTOMY       reports that he has quit smoking. His smoking use included cigarettes. He has a 2.00 pack-year smoking history. He has never used smokeless tobacco. He reports that he does not drink alcohol or use drugs.  Allergies  Allergen Reactions  . Kiwi Extract Swelling and Other (See Comments)    Facial swelling   . Tape Other (See Comments)    Plastic tape-blistering    Family History  Problem Relation Age of Onset  . Heart attack Sister        died @ 70  . Cancer Mother        died @ 65; unknown type  . Diabetes Brother        deceased  . Cirrhosis Father        alcohol related  . Diabetes Father   . Esophageal cancer Neg Hx   . Colon cancer Neg Hx   . Pancreatic cancer Neg Hx   . Stomach cancer Neg Hx      Prior to Admission medications   Medication Sig Start Date End Date Taking? Authorizing Provider  acetaminophen (TYLENOL) 500 MG tablet Take 1,000 mg by mouth 2 (two) times daily as needed for moderate pain or headache.   Yes [provider]  aspirin EC 81 MG tablet Take 1 tablet (81 mg total) by mouth daily. 05/28/12  Yes Weaver, Scott T, PA-C  atorvastatin (LIPITOR) 40 MG tablet Take 1 tablet (40 mg total) by mouth daily at 6 PM. 12/13/13  Yes Brett Canales, PA-C  calcium acetate (PHOSLO) 667 MG capsule Take 3 capsules (2,001 mg total) by mouth 3 (three) times daily with meals. Patient taking differently: Take 1,334 mg by mouth 2 (two) times daily with a meal.  10/09/15  Yes Short, Noah Delaine, MD  clopidogrel (PLAVIX) 75 MG tablet Take 1 tablet (75 mg total) by mouth daily with breakfast. 07/08/17  Yes Consuella Lose, MD  cyclobenzaprine (FLEXERIL) 5 MG tablet Take 1 tablet (5 mg total) by mouth 3 (three) times daily as needed for muscle spasms. 02/17/18  Yes Drenda Freeze, MD  diclofenac  (VOLTAREN) 75 MG EC tablet Take 75 mg by mouth 2 (two) times daily.   Yes [provider]  gabapentin (NEURONTIN) 100 MG capsule Take 100 mg by mouth 2 (two) times daily.   Yes [provider]  multivitamin (RENA-VIT) TABS tablet Take 1 tablet by mouth at bedtime. 06/23/14  Yes Domenic Polite, MD  nitroGLYCERIN (NITROSTAT) 0.4 MG SL tablet Place 1 tablet (0.4 mg total) under the tongue every 5 (five) minutes as needed for chest pain. 03/20/12  Yes Barrett, Evelene Croon, PA-C  oxyCODONE (OXY IR/ROXICODONE) 5 MG immediate release tablet Take  1 tablet (5 mg total) by mouth every 4 (four) hours as needed for moderate pain or severe pain ((score 4 to 6)). 10/20/17  Yes Elsner, Mallie Mussel, MD  pantoprazole (PROTONIX) 40 MG tablet Take 40 mg by mouth daily.   Yes [provider]  polyethylene glycol (MIRALAX / GLYCOLAX) packet Take 17 g by mouth daily as needed for moderate constipation. 03/22/17  Yes Aline August, MD  rOPINIRole (REQUIP) 0.5 MG tablet Take 0.5 mg by mouth 2 (two) times daily.    Yes [provider]  traMADol (ULTRAM) 50 MG tablet Take 50 mg by mouth every 6 (six) hours as needed for moderate pain.  02/06/18  Yes [provider]    Physical Exam: Vitals:   02/22/18 1926 02/22/18 1934 02/23/18 0023  BP: (!) 181/95 (!) 164/91 (!) 169/100  Pulse: 76 78 79  Resp:  15 12  Temp:  98.1 F (36.7 C)   TempSrc:  Oral   SpO2: 97% 97% 98%    Constitutional: NAD, calm, comfortable Eyes: PERRL, lids and conjunctivae normal ENMT: Mucous membranes are moist. Posterior pharynx clear of any exudate or lesions.Normal dentition.  Neck: normal, supple, no masses, no thyromegaly Respiratory: clear to auscultation bilaterally, no wheezing, no crackles. Normal respiratory effort. No accessory muscle use.  Cardiovascular: Regular rate and rhythm, no murmurs / rubs / gallops. No extremity edema. 2+ pedal pulses. No carotid bruits.  Abdomen: no tenderness, no masses  palpated. No hepatosplenomegaly. Bowel sounds positive.  Musculoskeletal: no clubbing / cyanosis. No joint deformity upper and lower extremities. Good ROM, no contractures. Normal muscle tone.  Skin: no rashes, lesions, ulcers. No induration Neurologic: CN 2-12 grossly intact. Sensation intact, DTR normal. Strength 5/5 in all 4.  Psychiatric: Normal judgment and insight. Alert and oriented x 3. Normal mood.    Labs on Admission: I have personally reviewed following labs and imaging studies  CBC: Recent Labs  Lab 02/17/18 1618 02/22/18 1945  WBC 8.2 6.0  NEUTROABS 4.4 2.8  HGB 11.7* 11.9*  HCT 37.7* 37.9*  MCV 97.2 95.5  PLT 259 086   Basic Metabolic Panel: Recent Labs  Lab 02/17/18 1618 02/22/18 1945  NA 133* 132*  K 3.9 4.4  CL 94* 91*  CO2 25 29  GLUCOSE 116* 85  BUN 76* 38*  CREATININE 15.02* 11.71*  CALCIUM 8.3* 8.4*   GFR: Estimated Creatinine Clearance: 6.3 mL/min (A) (by C-G formula based on SCr of 11.71 mg/dL (H)). Liver Function Tests: Recent Labs  Lab 02/17/18 1618  AST 9*  ALT 13  ALKPHOS 51  BILITOT 0.6  PROT 6.2*  ALBUMIN 3.7   No results for input(s): LIPASE, AMYLASE in the last 168 hours. No results for input(s): AMMONIA in the last 168 hours. Coagulation Profile: No results for input(s): INR, PROTIME in the last 168 hours. Cardiac Enzymes: No results for input(s): CKTOTAL, CKMB, CKMBINDEX, TROPONINI in the last 168 hours. BNP (last 3 results) No results for input(s): PROBNP in the last 8760 hours. HbA1C: No results for input(s): HGBA1C in the last 72 hours. CBG: No results for input(s): GLUCAP in the last 168 hours. Lipid Profile: No results for input(s): CHOL, HDL, LDLCALC, TRIG, CHOLHDL, LDLDIRECT in the last 72 hours. Thyroid Function Tests: No results for input(s): TSH, T4TOTAL, FREET4, T3FREE, THYROIDAB in the last 72 hours. Anemia Panel: No results for input(s): VITAMINB12, FOLATE, FERRITIN, TIBC, IRON, RETICCTPCT in the last 72  hours. Urine analysis:    Component Value Date/Time   COLORURINE  YELLOW 03/18/2017 [place] 03/18/2017 1721   LABSPEC 1.007 03/18/2017 1721   PHURINE 9.0 (H) 03/18/2017 1721   GLUCOSEU 150 (A) 03/18/2017 1721   HGBUR NEGATIVE 03/18/2017 1721   BILIRUBINUR NEGATIVE 03/18/2017 1721   KETONESUR NEGATIVE 03/18/2017 1721   PROTEINUR >=300 (A) 03/18/2017 1721   UROBILINOGEN 0.2 06/18/2014 2308   NITRITE NEGATIVE 03/18/2017 1721   LEUKOCYTESUR NEGATIVE 03/18/2017 1721    Radiological Exams on Admission: Dg Chest 2 View  Result Date: 02/22/2018 CLINICAL DATA:  Shortness of breath when laying flat. EXAM: CHEST - 2 VIEW COMPARISON:  04/27/2017 FINDINGS: There is no focal parenchymal opacity. There is no pleural effusion or pneumothorax. The heart and mediastinal contours are unremarkable. There is thoracic aortic atherosclerosis. The osseous structures are unremarkable. IMPRESSION: No active cardiopulmonary disease. Electronically Signed   By: Kathreen Devoid   On: 02/22/2018 20:20   Ct Angio Chest Pe W/cm &/or Wo Cm  Result Date: 02/22/2018 CLINICAL DATA:  Shortness of breath, chest pain, back pain EXAM: CT ANGIOGRAPHY CHEST WITH CONTRAST TECHNIQUE: Multidetector CT imaging of the chest was performed using the standard protocol during bolus administration of intravenous contrast. Multiplanar CT image reconstructions and MIPs were obtained to evaluate the vascular anatomy. CONTRAST:  157mL ISOVUE-370 IOPAMIDOL (ISOVUE-370) INJECTION 76% COMPARISON:  None. FINDINGS: Cardiovascular: Satisfactory opacification of the pulmonary arteries to the segmental level. No evidence of pulmonary embolism. Normal heart size. No pericardial effusion. Thoracic aortic. Coronary artery atherosclerosis in the left main, lad, circumflex, first diagonal branch and RCA. Mediastinum/Nodes: No enlarged mediastinal, hilar, or axillary lymph nodes. Thyroid gland, trachea, and esophagus demonstrate no significant  findings. Lungs/Pleura: No focal consolidation. No pleural effusion or pneumothorax. Upper Abdomen: No acute upper abdominal abnormality. Musculoskeletal: No acute osseous abnormality. No aggressive osseous lesion. Mild degenerative disease of the thoracic spine. Other: Bilateral retroareolar fibroglandular soft tissue as can be seen with gynecomastia. Review of the MIP images confirms the above findings. IMPRESSION: 1. No acute cardiopulmonary disease. 2.  Aortic Atherosclerosis (ICD10-I70.0). 3. Multivessel coronary artery atherosclerosis. Electronically Signed   By: Kathreen Devoid   On: 02/22/2018 22:56    EKG: Independently reviewed.  Assessment/Plan Principal Problem:   Elevated troponin Active Problems:   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/[facility]   ESRD (end stage renal disease) on dialysis ([facility])   Orthopnea    1. Elevated trop and orthopnea - 1. See cards note 2. Flat trend 3. Suspect mild fluid overload due to missed Wed dialysis and only half dialysis session today 4. Tele monitor 5. Serial trops 6. Call nephrology in AM, likely for Sat dialysis session I suspect (fluid overload, and also got IV contrast dye today for CTA) 2. ESRD - see above 3. H/o CABG - 1. Continue ASA, statin, plavix 4. Back pain - 1. Chronic and ongoing radiculopathy diagnosed by Dr. Kathyrn Sheriff 2. Continue Oxycodone 3. Surgery planned for Dec 5th to address.  DVT prophylaxis: Heparin [place] Code Status: Full Family Communication: No family in room Disposition Plan: Home after admit Consults called: Cards Admission status: Place in obs   Bryne Lindon, [facility] Pager [phone] Only works nights!  If 7AM-7PM, please contact the primary day team physician taking care of patient  [email or web address] Password [email or web address]  02/23/2018, 12:38 AM

## 2018-02-23 NOTE — Progress Notes (Signed)
  Echocardiogram 2D Echocardiogram has been performed.  Jon Anderson 02/23/2018, 3:49 PM

## 2018-02-23 NOTE — Progress Notes (Signed)
CRITICAL VALUE ALERT  Critical Value: Troponin 0.9  Date & Time Notied:  02/23/18; 1:50am  Provider Notified: Silas Sacramento, NP  Orders Received/Actions taken:

## 2018-02-25 ENCOUNTER — Encounter (HOSPITAL_COMMUNITY): Payer: Self-pay

## 2018-02-25 ENCOUNTER — Emergency Department (HOSPITAL_COMMUNITY): Payer: Medicare Other

## 2018-02-25 ENCOUNTER — Emergency Department (HOSPITAL_COMMUNITY)
Admission: EM | Admit: 2018-02-25 | Discharge: 2018-02-25 | Disposition: A | Discharge status: Home / Self Care | Payer: Medicare Other | Attending: Emergency Medicine | Admitting: Emergency Medicine

## 2018-02-25 ENCOUNTER — Encounter (HOSPITAL_COMMUNITY): Payer: Self-pay | Admitting: Emergency Medicine

## 2018-02-25 DIAGNOSIS — Z79899 Other long term (current) drug therapy: Secondary | ICD-10-CM | POA: Insufficient documentation

## 2018-02-25 DIAGNOSIS — E1122 Type 2 diabetes mellitus with diabetic chronic kidney disease: Secondary | ICD-10-CM | POA: Diagnosis not present

## 2018-02-25 DIAGNOSIS — Z87891 Personal history of nicotine dependence: Secondary | ICD-10-CM | POA: Diagnosis not present

## 2018-02-25 DIAGNOSIS — Z992 Dependence on renal dialysis: Secondary | ICD-10-CM | POA: Insufficient documentation

## 2018-02-25 DIAGNOSIS — N186 End stage renal disease: Secondary | ICD-10-CM | POA: Insufficient documentation

## 2018-02-25 DIAGNOSIS — I132 Hypertensive heart and chronic kidney disease with heart failure and with stage 5 chronic kidney disease, or end stage renal disease: Secondary | ICD-10-CM | POA: Insufficient documentation

## 2018-02-25 DIAGNOSIS — R079 Chest pain, unspecified: Secondary | ICD-10-CM | POA: Insufficient documentation

## 2018-02-25 DIAGNOSIS — Z7902 Long term (current) use of antithrombotics/antiplatelets: Secondary | ICD-10-CM | POA: Insufficient documentation

## 2018-02-25 DIAGNOSIS — I5032 Chronic diastolic (congestive) heart failure: Secondary | ICD-10-CM | POA: Diagnosis not present

## 2018-02-25 DIAGNOSIS — M79601 Pain in right arm: Secondary | ICD-10-CM | POA: Diagnosis not present

## 2018-02-25 DIAGNOSIS — I259 Chronic ischemic heart disease, unspecified: Secondary | ICD-10-CM | POA: Diagnosis not present

## 2018-02-25 LAB — BASIC METABOLIC PANEL
Anion gap: 11 (ref 5–15)
BUN: 29 mg/dL — ABNORMAL HIGH (ref 8–23)
CO2: 30 mmol/L (ref 22–32)
Calcium: 8.4 mg/dL — ABNORMAL LOW (ref 8.9–10.3)
Chloride: 92 mmol/L — ABNORMAL LOW (ref 98–111)
Creatinine, Ser: 10.32 mg/dL — ABNORMAL HIGH (ref 0.61–1.24)
GFR calc Af Amer: 5 mL/min — ABNORMAL LOW (ref >60)
GFR calc non Af Amer: 4 mL/min — ABNORMAL LOW (ref >60)
Glucose, Bld: 97 mg/dL (ref 70–99)
Potassium: 3.5 mmol/L (ref 3.5–5.1)
Sodium: 133 mmol/L — ABNORMAL LOW (ref 135–145)

## 2018-02-25 LAB — CBC
HCT: 40 % (ref 39.0–52.0)
Hemoglobin: 12.7 g/dL — ABNORMAL LOW (ref 13.0–17.0)
MCH: 30.7 pg (ref 26.0–34.0)
MCHC: 31.8 g/dL (ref 30.0–36.0)
MCV: 96.6 fL (ref 80.0–100.0)
Platelets: 189 10*3/uL (ref 150–400)
RBC: 4.14 MIL/uL — ABNORMAL LOW (ref 4.22–5.81)
RDW: 13.2 % (ref 11.5–15.5)
WBC: 7.4 10*3/uL (ref 4.0–10.5)
nRBC: 0 % (ref 0.0–0.2)

## 2018-02-25 LAB — I-STAT TROPONIN, ED
Troponin i, poc: 0.08 ng/mL (ref 0.00–0.08)
Troponin i, poc: 0.09 ng/mL (ref 0.00–0.08)

## 2018-02-25 MED ORDER — OXYCODONE-ACETAMINOPHEN 5-325 MG PO TABS
1.0000 | ORAL_TABLET | Freq: Once | ORAL | Status: AC
  Administered 2018-02-25: 1 via ORAL
  Filled 2018-02-25: qty 1

## 2018-02-25 NOTE — ED Notes (Signed)
Pt is still waiting for Cards to consult.  He is sitting on the side of the bed.  Drink and food given to pt

## 2018-02-25 NOTE — ED Provider Notes (Signed)
[facility] Provider Note   CSN: [id number] Arrival date & time: 02/25/18  1018     History   Chief Complaint Chief Complaint  Patient presents with  . Chest Pain  . Arm Pain    HPI Jon Anderson is a 67 y.o. male who is a history of end-stage renal disease, chronic back pain.  The patient arrives for evaluation of right arm pain and paresthesia.  He states that he was at dialysis today and got about 2 hours of his treatment.  This is about as much as he is able to tolerate secondary to positional discomfort from his chronic back pain.  He is currently scheduled for lumbar surgery with Dr. Kathyrn Sheriff in December 2019.  The patient dates that he was having significant aching pain in his right arm with paresthesia of the fingers which seem to be secondary to the position of his blood pressure cuff.  He states that it was very uncomfortable.  He tried to readjust that it did not improve.  Once the blood pressure cuff came off he was able to move it but it took several minutes for him to feel improved.  This is what brought him to the emergency department.  He is also complaining of sharp pain in his groin which is been worsening over the past week which makes his ambulatory status worse than normal.  He denies any new weakness or numbness.  He denies any active chest pain or shortness of breath.  The patient was seen Friday evening after his dialysis session with for complaints of chest pain which he was noted at that time to have elevated troponins.  He was admitted and seen by cardiology who felt that this was likely due to his renal failure.  He had a negative CT angios and cardiac work-up.  HPI  Past Medical History:  Diagnosis Date  . Anemia of chronic disease   . CAD (coronary artery disease)    a.  Myoview 4/11: EF 53%, no scar or ischemia   c. MV 2012 Nl perfusion, apical thinning.  No ischemia or scar.  EF 49%, appears greater by visual  estimate.;  d.  Dob stress echo 12/13:  Negative Dob stress echo. There is no evidence of ischemia.  The LVF is normal. b. Normal cors 2016.  . Carotid stenosis    a. <37% RICA, >10% LICA by duplex 09/2692  . Chronic chest pain    occ  . ESRD (end stage renal disease) on dialysis ([place])    M-W-F  . GERD (gastroesophageal reflux disease)   . HNP (herniated nucleus pulposus), lumbar   . HTN (hypertension)    echo 3/10: EF 60%, LAE  . Hyperlipidemia   . Nephrolithiasis    "passed them all"  . Peripheral arterial disease ([place])    a. s/p PTCA to L SFA.  Marland Kitchen Pneumonia   . Restless legs   . Sleep apnea    no cpap, needs to reschedule appointment to set up aquiring cpap  . Snores    a. presumed OSA, pt has refused sleep eval in past.  . Type II diabetes mellitus ([place])    no longer on medications, checks blood glucose at home    Patient Active Problem List   Diagnosis Date Noted  . Orthopnea 02/23/2018  . Elevated troponin 02/23/2018  . HNP (herniated nucleus pulposus), lumbar 07/03/2017  . Chest pain in adult 04/27/2017  . Restless leg syndrome, uncontrolled 04/27/2017  .  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 Anemia of chronic disease 03/17/2017  . Severe uncontrolled hypertension 03/17/2017  . ESRD (end stage renal disease) on dialysis ([place]) 03/17/2017  . Hyperlipidemia 03/17/2017  . Chronic chest pain 03/17/2017  . Type II diabetes mellitus ([place]) 03/17/2017  . Acute on chronic diastolic heart failure ([place]) 03/17/2017  . Acute heart failure ([place]) 03/17/2017  . Lumbar radiculopathy 01/16/2017  . Chest pain, non-cardiac 10/09/2015  . Acute on chronic renal failure ([place]) 06/16/2014  . Renal failure (ARF), acute on chronic ([facility]) 06/16/2014  . Shoulder pain, left 12/15/2013  . Chest pain 12/15/2013  . Claudication ([place]) 12/11/2013  . PVD (peripheral vascular disease) ([place]) 12/11/2013  . Peripheral arterial disease ([place]) 09/30/2013  . Carotid artery disease ([place]) 09/30/2013  .  Acute chest pain 11/15/2012  . Left-sided chest wall pain 03/19/2012  . Bruit 09/15/2010  . CAD (coronary artery disease) nonobstructive per cath 2012   . Precordial chest pain 07/26/2009  . DM (diabetes mellitus) ([place]) 07/22/2009  . Hyperlipidemia LDL goal <70 07/22/2009  . Hypertension 07/22/2009    Past Surgical History:  Procedure Laterality Date  . ANGIOPLASTY / STENTING FEMORAL Left 12/11/2013   dr berry  . AV FISTULA PLACEMENT Left 03/19/2014   Procedure: CREATION OF ARTERIOVENOUS (AV) FISTULA  LEFT UPPER ARM;  Surgeon: Mal Misty, MD;  Location: [facility];  Service: Vascular;  Laterality: Left;  . CARDIAC CATHETERIZATION  2001 and 2010   . COLONOSCOPY W/ BIOPSIES AND POLYPECTOMY    . COLONOSCOPY WITH PROPOFOL N/A 08/01/2016   Procedure: COLONOSCOPY WITH PROPOFOL;  Surgeon: Carol Ada, MD;  Location: [facility];  Service: Endoscopy;  Laterality: N/A;  . ESOPHAGOGASTRODUODENOSCOPY (EGD) WITH PROPOFOL N/A 08/01/2016   Procedure: ESOPHAGOGASTRODUODENOSCOPY (EGD) WITH PROPOFOL;  Surgeon: Carol Ada, MD;  Location: [facility];  Service: Endoscopy;  Laterality: N/A;  . FOOT FRACTURE SURGERY Right   . FRACTURE SURGERY    . INGUINAL HERNIA REPAIR Left   . LEFT HEART CATH AND CORS/GRAFTS ANGIOGRAPHY N/A 04/27/2017   Procedure: LEFT HEART CATH AND CORS/GRAFTS ANGIOGRAPHY;  Surgeon: Leonie Man, MD;  Location: [facility];  Service: Cardiovascular;  Laterality: N/A;  . LEFT HEART CATHETERIZATION WITH CORONARY ANGIOGRAM N/A 06/22/2014   Procedure: LEFT HEART CATHETERIZATION WITH CORONARY ANGIOGRAM;  Surgeon: Troy Sine, MD;  Location[facility] C[facility] LAB;  Service: Cardiovascular;  Laterality: N/A;  . LOWER EXTREMITY ANGIOGRAM Left 12/11/2013   Procedure: LOWER EXTREMITY ANGIOGRAM;  Surgeon: Lorretta Harp, MD;  Location: [facility] CATH LAB;  Service: Cardiovascular;  Laterality: Left;  . LUMBAR LAMINECTOMY/DECOMPRESSION MICRODISCECTOMY Right 07/03/2017   Procedure: MICRODISCECTOMY  LUMBAR FIVE - SACRAL ONE RIGHT;  Surgeon: Consuella Lose, MD;  Location: [facility];  Service: Neurosurgery;  Laterality: Right;  . LUMBAR LAMINECTOMY/DECOMPRESSION MICRODISCECTOMY Right 10/19/2017   Procedure: MICRODISCECTOMY LUMBAR FIVE- SACRAL 1 ONE ;  Surgeon: Consuella Lose, MD;  Location: [facility];  Service: Neurosurgery;  Laterality: Right;  . TONSILLECTOMY AND ADENOIDECTOMY          Home Medications    Prior to Admission medications   Medication Sig Start Date End Date Taking? Authorizing Provider  acetaminophen (TYLENOL) 500 MG tablet Take 1,000 mg by mouth 2 (two) times daily as needed for moderate pain or headache.   Yes [provider]  aspirin EC 81 MG tablet Take 1 tablet (81 mg total) by mouth daily. 05/28/12  Yes Weaver, Scott T, PA-C  atorvastatin (LIPITOR) 40 MG tablet Take 1 tablet (  40 mg total) by mouth daily at 6 PM. 12/13/13  Yes Brett Canales, PA-C  calcium acetate (PHOSLO) 667 MG capsule Take 3 capsules (2,001 mg total) by mouth 3 (three) times daily with meals. Patient taking differently: Take 1,334 mg by mouth 2 (two) times daily with a meal.  10/09/15  Yes Short, Noah Delaine, MD  clopidogrel (PLAVIX) 75 MG tablet Take 1 tablet (75 mg total) by mouth daily with breakfast. 07/08/17  Yes Consuella Lose, MD  cyclobenzaprine (FLEXERIL) 5 MG tablet Take 1 tablet (5 mg total) by mouth 3 (three) times daily as needed for muscle spasms. 02/17/18  Yes Drenda Freeze, MD  diclofenac (VOLTAREN) 75 MG EC tablet Take 75 mg by mouth 2 (two) times daily.   Yes [provider]  gabapentin (NEURONTIN) 100 MG capsule Take 100 mg by mouth 2 (two) times daily.   Yes [provider]  multivitamin (RENA-VIT) TABS tablet Take 1 tablet by mouth at bedtime. 06/23/14  Yes Domenic Polite, MD  nitroGLYCERIN (NITROSTAT) 0.4 MG SL tablet Place 1 tablet (0.4 mg total) under the tongue every 5 (five) minutes as needed for chest pain. 03/20/12  Yes Barrett, Evelene Croon, PA-C    oxyCODONE (OXY IR/ROXICODONE) 5 MG immediate release tablet Take 1 tablet (5 mg total) by mouth every 4 (four) hours as needed for up to 5 days for moderate pain or severe pain ((score 4 to 6)). 02/23/18 02/28/18 Yes Cristal Deer, MD  pantoprazole (PROTONIX) 40 MG tablet Take 40 mg by mouth daily.   Yes [provider]  polyethylene glycol (MIRALAX / GLYCOLAX) packet Take 17 g by mouth daily as needed for moderate constipation. 03/22/17  Yes Aline August, MD  rOPINIRole (REQUIP) 0.5 MG tablet Take 0.5 mg by mouth 2 (two) times daily.    Yes [provider]    Family History Family History  Problem Relation Age of Onset  . Heart attack Sister        died @ 55  . Cancer Mother        died @ 25; unknown type  . Diabetes Brother        deceased  . Cirrhosis Father        alcohol related  . Diabetes Father   . Esophageal cancer Neg Hx   . Colon cancer Neg Hx   . Pancreatic cancer Neg Hx   . Stomach cancer Neg Hx     Social History Social History   Tobacco Use  . Smoking status: Former Smoker    Packs/day: 1.00    Years: 2.00    Pack years: 2.00    Types: Cigarettes  . Smokeless tobacco: Never Used  . Tobacco comment: quit smoking 40 yrs ago  Substance Use Topics  . Alcohol use: No    Alcohol/week: 0.0 standard drinks  . Drug use: No     Allergies   Kiwi extract and Tape   Review of Systems Review of Systems Ten systems reviewed and are negative for acute change, except as noted in the HPI.    Physical Exam Updated Vital Signs BP (!) 148/92 (BP Location: Right Arm)   Pulse 76   Temp 98.2 F (36.8 C) (Oral)   Resp 12   Wt 71.7 kg   SpO2 97%   BMI 22.04 kg/m   Physical Exam  Physical Exam  Nursing note and vitals reviewed. Constitutional: He appears well-developed and well-nourished. No distress.  HENT:  Head: Normocephalic and atraumatic.  Eyes:  Conjunctivae normal are normal. No scleral icterus.  Neck: Normal range of motion.  Neck supple.  Cardiovascular: Normal rate, regular rhythm and normal heart sounds.  Graft in the left upper arm with dialysis access in place.  Pulmonary/Chest: Effort normal and breath sounds normal. No respiratory distress.  Abdominal: Soft. There is no tenderness.  Musculoskeletal: He exhibits no edema. Bilateral upper extremities with strong equal grip strength, no tenderness to palpation, full range of motion. Mild pain with internal rotation of the hip, negative straight leg test. Neurological: He is alert.  Skin: Skin is warm and dry. He is not diaphoretic.  Psychiatric: His behavior is normal.    ED Treatments / Results  Labs (all labs ordered are listed, but only abnormal results are displayed) Labs Reviewed  BASIC METABOLIC PANEL - Abnormal; Notable for the following components:      Result Value   Sodium 133 (*)    Chloride 92 (*)    BUN 29 (*)    Creatinine, Ser 10.32 (*)    Calcium 8.4 (*)    GFR calc non Af Amer 4 (*)    GFR calc Af Amer 5 (*)    All other components within normal limits  CBC - Abnormal; Notable for the following components:   RBC 4.14 (*)    Hemoglobin 12.7 (*)    All other components within normal limits  I-STAT TROPONIN, ED - Abnormal; Notable for the following components:   Troponin i, poc 0.09 (*)    All other components within normal limits  I-STAT TROPONIN, ED    EKG EKG Interpretation  Date/Time:  Monday February 25 2018 10:37:18 EST Ventricular Rate:  78 PR Interval:  148 QRS Duration: 102 QT Interval:  404 QTC Calculation: 460 R Axis:   68 Text Interpretation:  Sinus rhythm with occasional Premature ventricular complexes Anteroseptal infarct , age undetermined Abnormal ECG No significant change since last tracing Confirmed by Gareth Morgan [phone]) on 02/25/2018 10:40:51 AM Also confirmed by Gareth Morgan [phone]), editor Lynder Parents [phone])  on 02/25/2018 4:21:08 PM   Radiology Dg Chest 2 View  Result Date:  02/25/2018 CLINICAL DATA:  Acute chest pain. EXAM: CHEST - 2 VIEW COMPARISON:  02/22/2018 CT, chest radiograph and prior studies FINDINGS: The cardiomediastinal silhouette is unremarkable. There is no evidence of focal airspace disease, pulmonary edema, suspicious pulmonary nodule/mass, pleural effusion, or pneumothorax. No acute bony abnormalities are identified. IMPRESSION: No active cardiopulmonary disease. Electronically Signed   By: Margarette Canada M.D.   On: 02/25/2018 12:37   Dg Hip Unilat W Or Wo Pelvis 2-3 Views Left  Result Date: 02/25/2018 CLINICAL DATA:  Left hip pain after dialysis. EXAM: DG HIP (WITH OR WITHOUT PELVIS) 2-3V LEFT COMPARISON:  None. FINDINGS: There is no evidence of hip fracture or dislocation. No erosion or bone lesion seen. Negative pelvic ring. Lower lumbar disc degeneration with mild dextrocurvature. IMPRESSION: No acute finding or degenerative hip narrowing. Electronically Signed   By: Monte Fantasia M.D.   On: 02/25/2018 12:38    Procedures Procedures (including critical care time)  Medications Ordered in ED Medications  oxyCODONE-acetaminophen (PERCOCET/ROXICET) 5-325 MG per tablet 1 tablet (1 tablet Oral Given 02/25/18 1313)     Initial Impression / Assessment and Plan / ED Course  I have reviewed the triage vital signs and the nursing notes.  Pertinent labs & imaging results that were available during my care of the patient were reviewed by me and considered in my medical decision making (see  chart for details).  Clinical Course as of Feb 27 1751  Mon Feb 25, 2018  1310 Troponin i, poc: 0.08 [AH]    Clinical Course User Index [AH] Margarita Mail, PA-C   Patient second troponin elevating to 0.09.  I spoke with Dr. Orlando Penner of the hospitalist service who asked that we consult with cardiology given his recent admission and cardiac rule out with cardiology consultation.  I have given sign out to Dr. Fabio Bering who will consult with the cardiologist.  I expect  the patient will likely be discharged.  His EKG is unchanged from previous and he has no pain in the ER throughout his visit. Cardiology consult is pending and I have given sign out to Dr. Fabio Bering who will assume care of the patient and disposition appropriately as directed by cardiology.   Final Clinical Impressions(s) / ED Diagnoses   Final diagnoses:  Chest pain, unspecified type  Right arm pain    ED Discharge Orders    None       Margarita Mail, PA-C 02/26/18 1752    Gareth Morgan, MD 02/27/18 [phone]

## 2018-02-25 NOTE — ED Triage Notes (Signed)
Pt in from dialysis via [facility] with c/o cp/R arm cramping while 2 hrs into session. States his R arm was also tingling, but denied having any sob, nausea or diaphoresis. Arrives with mild R arm cramping and low back pain, hx of CBP.

## 2018-02-25 NOTE — ED Notes (Signed)
Phlebotomy at bedside attempting blood draw

## 2018-02-25 NOTE — ED Notes (Signed)
Pt care assumed, verbal report obtained.  He is resting and appears comfortable.

## 2018-02-25 NOTE — ED Provider Notes (Signed)
  Physical Exam  BP (!) 150/98   Pulse 85   Temp 98.2 F (36.8 C) (Oral)   Resp 13   Wt 71.7 kg   SpO2 99%   BMI 22.04 kg/m   Physical Exam  ED Course/Procedures   Clinical Course as of Feb 26 1844  Mon Feb 25, 2018  1310 Troponin i, poc: 0.08 [AH]    Clinical Course User Index [AH] Margarita Mail, PA-C    Procedures  MDM  Care transferred from Margarita Mail at 4:30p. Received a call back from cardiologist on call, Dr. Harrington Challenger. Discussed case by phone, and would not recommend cardiac catherization for this patient and thinks troponin elevation likely secondary to his ESRD.  Patient safe for discharge. Strict return precautions discussed.  All questions answered.    Erskine Squibb, MD 02/25/18 9643    Merrily Pew, MD 02/26/18 [phone]

## 2018-02-27 ENCOUNTER — Encounter: Payer: Self-pay | Admitting: Adult Health

## 2018-02-27 ENCOUNTER — Ambulatory Visit (INDEPENDENT_AMBULATORY_CARE_PROVIDER_SITE_OTHER): Payer: Medicare Other | Admitting: Adult Health

## 2018-02-27 VITALS — BP 111/59 | HR 90 | Ht 71.0 in | Wt 161.6 lb

## 2018-02-27 DIAGNOSIS — I2510 Atherosclerotic heart disease of native coronary artery without angina pectoris: Secondary | ICD-10-CM

## 2018-02-27 DIAGNOSIS — I10 Essential (primary) hypertension: Secondary | ICD-10-CM

## 2018-02-27 DIAGNOSIS — I739 Peripheral vascular disease, unspecified: Secondary | ICD-10-CM | POA: Diagnosis not present

## 2018-02-27 DIAGNOSIS — I6522 Occlusion and stenosis of left carotid artery: Secondary | ICD-10-CM | POA: Diagnosis not present

## 2018-02-27 DIAGNOSIS — R6889 Other general symptoms and signs: Secondary | ICD-10-CM

## 2018-02-27 NOTE — Progress Notes (Signed)
Cardiology Office Note   Date:  02/27/2018   ID:  [name], DOB [birth year], MRN [id number]  PCP:  [name], MD  Cardiologist: [facility]  Chief Complaint  Patient presents with  . Coronary Artery Disease  . PAD     History of Present Illness: Jon Anderson is a 67 y.o. male who presents for post hospitalization follow up after admission for worsening DOE, PND, and orthopnea with LE and abdominal bloating. He has a history of CAD with CABG, with cardiac cath in 2018 revealing patent LIMA to LAD, and moderate non-obstructive diease of the circumflex.   He was not found to be in volume overload, he was to continue HD as scheduled as he missed a dialysis day and only participated in half of the session on follow up. He was again seen in the ER on 02/25/2018 for chest pain and left arm pain.Troponin was elevated but on review by Dr. Harrington Challenger, it was felt elevation was related to ESRD.    He comes today with continued aches and pains in his back, left hip and LE. He had a ABI completed in Sept, 2019, but had two hospitalization since then and has not followed up until his time. He is due to have spinal fusion in the lumbar spine in 2 weeks.   Past Medical History:  Diagnosis Date  . Anemia of chronic disease   . CAD (coronary artery disease)    a.  Myoview 4/11: EF 53%, no scar or ischemia   c. MV 2012 Nl perfusion, apical thinning.  No ischemia or scar.  EF 49%, appears greater by visual estimate.;  d.  Dob stress echo 12/13:  Negative Dob stress echo. There is no evidence of ischemia.  The LVF is normal. b. Normal cors 2016.  . Carotid stenosis    a. <14% RICA, >43% LICA by duplex 04/5398  . Chronic chest pain    occ  . ESRD (end stage renal disease) on dialysis ([place])    M-W-F  . GERD (gastroesophageal reflux disease)   . HNP (herniated nucleus pulposus), lumbar   . HTN (hypertension)    echo 3/10: EF 60%, LAE  . Hyperlipidemia   . Nephrolithiasis    "passed them all"  .  Peripheral arterial disease ([place])    a. s/p PTCA to L SFA.  Marland Kitchen Pneumonia   . Restless legs   . Sleep apnea    no cpap, needs to reschedule appointment to set up aquiring cpap  . Snores    a. presumed OSA, pt has refused sleep eval in past.  . Type II diabetes mellitus ([place])    no longer on medications, checks blood glucose at home    Past Surgical History:  Procedure Laterality Date  . ANGIOPLASTY / STENTING FEMORAL Left 12/11/2013   dr berry  . AV FISTULA PLACEMENT Left 03/19/2014   Procedure: CREATION OF ARTERIOVENOUS (AV) FISTULA  LEFT UPPER ARM;  Surgeon: Mal Misty, MD;  Location: [place];  Service: Vascular;  Laterality: Left;  . CARDIAC CATHETERIZATION  2001 and 2010   . COLONOSCOPY W/ BIOPSIES AND POLYPECTOMY    . COLONOSCOPY WITH PROPOFOL N/A 08/01/2016   Procedure: COLONOSCOPY WITH PROPOFOL;  Surgeon: Carol Ada, MD;  Location: [facility];  Service: Endoscopy;  Laterality: N/A;  . ESOPHAGOGASTRODUODENOSCOPY (EGD) WITH PROPOFOL N/A 08/01/2016   Procedure: ESOPHAGOGASTRODUODENOSCOPY (EGD) WITH PROPOFOL;  Surgeon: Carol Ada, MD;  Location: [facility];  Service: Endoscopy;  Laterality: N/A;  . FOOT  FRACTURE SURGERY Right   . FRACTURE SURGERY    . INGUINAL HERNIA REPAIR Left   . LEFT HEART CATH AND CORS/GRAFTS ANGIOGRAPHY N/A 04/27/2017   Procedure: LEFT HEART CATH AND CORS/GRAFTS ANGIOGRAPHY;  Surgeon: Leonie Man, MD;  Location: [place] [facility];  Service: Cardiovascular;  Laterality: N/A;  . LEFT HEART CATHETERIZATION WITH CORONARY ANGIOGRAM N/A 06/22/2014   Procedure: LEFT HEART CATHETERIZATION WITH CORONARY ANGIOGRAM;  Surgeon: Troy Sine, MD;  Location: [facility] C[facility] LAB;  Service: Cardiovascular;  Laterality: N/A;  . LOWER EXTREMITY ANGIOGRAM Left 12/11/2013   Procedure: LOWER EXTREMITY ANGIOGRAM;  Surgeon: Lorretta Harp, MD;  Location: [facility] CATH LAB;  Service: Cardiovascular;  Laterality: Left;  . LUMBAR LAMINECTOMY/DECOMPRESSION MICRODISCECTOMY Right 07/03/2017    Procedure: MICRODISCECTOMY LUMBAR FIVE - SACRAL ONE RIGHT;  Surgeon: Consuella Lose, MD;  Location: [place];  Service: Neurosurgery;  Laterality: Right;  . LUMBAR LAMINECTOMY/DECOMPRESSION MICRODISCECTOMY Right 10/19/2017   Procedure: MICRODISCECTOMY LUMBAR FIVE- SACRAL 1 ONE ;  Surgeon: Consuella Lose, MD;  Location: [place];  Service: Neurosurgery;  Laterality: Right;  . TONSILLECTOMY AND ADENOIDECTOMY       Current Outpatient Medications  Medication Sig Dispense Refill  . acetaminophen (TYLENOL) 500 MG tablet Take 1,000 mg by mouth 2 (two) times daily as needed for moderate pain or headache.    Marland Kitchen aspirin EC 81 MG tablet Take 1 tablet (81 mg total) by mouth daily.    Marland Kitchen atorvastatin (LIPITOR) 40 MG tablet Take 1 tablet (40 mg total) by mouth daily at 6 PM. 30 tablet 5  . calcium acetate (PHOSLO) 667 MG capsule Take 3 capsules (2,001 mg total) by mouth 3 (three) times daily with meals. (Patient taking differently: Take 1,334 mg by mouth 2 (two) times daily with a meal. ) 270 capsule 0  . clopidogrel (PLAVIX) 75 MG tablet Take 1 tablet (75 mg total) by mouth daily with breakfast. 30 tablet 11  . cyclobenzaprine (FLEXERIL) 5 MG tablet Take 1 tablet (5 mg total) by mouth 3 (three) times daily as needed for muscle spasms. 10 tablet 0  . diclofenac (VOLTAREN) 75 MG EC tablet Take 75 mg by mouth 2 (two) times daily.    Marland Kitchen gabapentin (NEURONTIN) 100 MG capsule Take 100 mg by mouth 2 (two) times daily.    . multivitamin (RENA-VIT) TABS tablet Take 1 tablet by mouth at bedtime. 30 tablet 0  . nitroGLYCERIN (NITROSTAT) 0.4 MG SL tablet Place 1 tablet (0.4 mg total) under the tongue every 5 (five) minutes as needed for chest pain. 25 tablet 3  . oxyCODONE (OXY IR/ROXICODONE) 5 MG immediate release tablet Take 1 tablet (5 mg total) by mouth every 4 (four) hours as needed for up to 5 days for moderate pain or severe pain ((score 4 to 6)). 40 tablet 0  . pantoprazole (PROTONIX) 40 MG tablet Take 40 mg by  mouth daily.    . polyethylene glycol (MIRALAX / GLYCOLAX) packet Take 17 g by mouth daily as needed for moderate constipation. 14 each 0  . rOPINIRole (REQUIP) 0.5 MG tablet Take 0.5 mg by mouth 2 (two) times daily.      No current facility-administered medications for this visit.     Allergies:   Kiwi extract and Tape    Social History:  The patient  reports that he has quit smoking. His smoking use included cigarettes. He has a 2.00 pack-year smoking history. He has never used smokeless tobacco. He reports that he does not drink alcohol  or use drugs.   Family History:  The patient's family history includes Cancer in his mother; Cirrhosis in his father; Diabetes in his brother and father; Heart attack in his sister.    ROS: All other systems are reviewed and negative. Unless otherwise mentioned in H&P    PHYSICAL EXAM: VS:  BP (!) 111/59   Pulse 90   Ht 5\' 11"  (1.803 m)   Wt 161 lb 9.6 oz (73.3 kg)   SpO2 96%   BMI 22.54 kg/m  , BMI Body mass index is 22.54 kg/m. GEN: Well nourished, well developed, in no acute distress HEENT: normal Neck: no JVD, carotid bruits, or masses Cardiac: RRR; diastolic murmur heard best on the RSB,  rubs, or gallops,no edema  Respiratory:  Clear to auscultation bilaterally, normal work of breathing GI: soft, nontender, nondistended, + BS MS: no deformity or atrophy, AV fistula on the left is palpable with good thrill. Skin: warm and dry, no rash Neuro:  Strength and sensation are intact Psych: euthymic mood, full affect   EKG:  Not completed this office visit   Recent Labs: 03/22/2017: Magnesium 2.1 02/17/2018: ALT 13 02/22/2018: B Natriuretic Peptide 1,612.8 02/25/2018: BUN 29; Creatinine, Ser 10.32; Hemoglobin 12.7; Platelets 189; Potassium 3.5; Sodium 133    Lipid Panel    Component Value Date/Time   CHOL 200 12/12/2013 0023   TRIG 90 12/12/2013 0023   HDL 48 12/12/2013 0023   CHOLHDL 4.2 12/12/2013 0023   VLDL 18 12/12/2013 0023    LDLCALC 134 (H) 12/12/2013 0023      Wt Readings from Last 3 Encounters:  02/27/18 161 lb 9.6 oz (73.3 kg)  02/25/18 158 lb (71.7 kg)  02-28-18 158 lb 6.4 oz (71.8 kg)    Other studies Reviewed: Echocardiogram 02-28-2018  Left ventricle: The cavity size was normal. There was severe   concentric hypertrophy. Systolic function was normal. The   estimated ejection fraction was in the range of 60% to 65%. Wall   motion was normal; there were no regional wall motion   abnormalities. There was an increased relative contribution of   atrial contraction to ventricular filling. Doppler parameters are   consistent with abnormal left ventricular relaxation (grade 1   diastolic dysfunction). Doppler parameters are consistent with   high ventricular filling pressure. - Aortic valve: Trileaflet; mildly thickened, mildly calcified   leaflets. Valve area (VTI): 1.99 cm^2. Valve area (Vmax): 2.12   cm^2. Valve area (Vmean): 2.02 cm^2. - Mitral valve: Severely calcified annulus. Moderate diffuse   thickening and calcification of the anterior leaflet and   posterior leaflet. Mobility of the posterior leaflet was   restricted to the point of immobility. The findings are   consistent with moderate stenosis. There was mild regurgitation.   Mean gradient (D): 7 mm Hg. Valve area by continuity equation   (using LVOT flow): 1.11 cm^2. - Left atrium: The atrium was mildly dilated. - Pulmonary arteries: Systolic pressure could not be accurately   estimated as IVC is not visualized.  Cardiac cath 04/27/2017 Conclusion     Prox LAD lesion is 75% stenosed. -Focal lesion that was previously described.  LIMA-LAD is widely patent and is normal in caliber. There is competitive flow.  Otherwise minimal disease throughout. Nothing made greater than 40% in the ostial circumflex.  The left ventricular systolic function is normal. The left ventricular ejection fraction is 50-55% by visual estimate.  LV  end diastolic pressure is low. - ~0-3 mmHg  There is no  aortic valve stenosis.   Angiographically no culprit lesion to explain the patient's symptoms.  He does have a significant LAD lesion which is a very focal lesion and easily stent pull, however there is a widely patent LIMA graft distally.  There is actually retrograde filling from the LIMA graft to the diagonal branch which would be the only branch jeopardized by the more upstream LAD lesion. Nothing to explain the patient's symptoms.  In fact, his LVEDP is very low after dialysis.  This would indicate that he was adequately dialyzed and argue against increased LVEDP causing microvascular ischemic symptoms.   ABI 12/11/2017 Arterial wall calcification precludes accurate ankle pressures and ABIs. Medial calcinosis prevents an accurate ABI calculation. Gain setting on today's exam was replicated from previous exam in 4/19 for waveform comparison. Waveforms appear similar to previous exam on 07/10/2017; no significant change.  Final Interpretation: Right: Resting right ankle-brachial index indicates noncompressible right lower extremity arteries.The right toe-brachial index is abnormal. PPG tracings appear dampened.  Left: Resting left ankle-brachial index indicates noncompressible left lower extremity arteries.The left toe-brachial index is abnormal. PPG tracings appear dampened. ASSESSMENT AND PLAN:  1.  Chronic LE pain: with PAD: ABI's in Sept were abnormal. I spoke with Dr. Gwenlyn Found on site who has reviewed the report. He recommends that he have aortic, illiac, and lower extremity dopplers completed with follow up with him.   2. CAD:  He had recent cardiac cath 04/27/2017 with patent grafts, without a culprit lesion to explain recurrent chest pain. Echo did not revel any wall motion abnormalities. Continue current regimen to include secondary prevention with statin therapy, ASA and Plavix.   3. Hypertension: Continues to be well controlled. He  is without complaints of elevated BP at home or during dialysis.   4. ESRD: Continue dialysis as directed.   Current medicines are reviewed at length with the patient today.    Labs/ tests ordered today include: Bilateral doppler LEA, Aorto/illiac ultrasound.   Phill Myron. West Pugh, ANP, AACC   02/27/2018 3:04 PM    [address] Office [phone] Fax [phone]

## 2018-02-27 NOTE — Patient Instructions (Signed)
Special Instructions: Your physician has requested that you have VAS US AORTA/IVC/ILIACS and VAS [place] LOWER EXTREMITY ARTERIAL DUPLEX.  Follow-Up: You will need a follow up appointment AFTER TESTING.   You may see  DR Gwenlyn Found. Jory Sims, DNP, AACC -or- one of the following Advanced Practice Providers on your designated Care Team:    . Kerin Ransom, [place]  Medication Instructions:  NO CHANGES- Your physician recommends that you continue on your current medications as directed. Please refer to the Current Medication list given to you today.  If you need a refill on your cardiac medications before your next appointment, please call your pharmacy.  Labwork: If you have labs (blood work) drawn today and your tests are completely normal, you will receive your results ONLY by: . MyChart Message (if you have MyChart) -OR- . A paper copy in the mail  At [facility], you and your health needs are our priority.  As part of our continuing mission to provide you with exceptional heart care, we have created designated Provider Care Teams.  These Care Teams include your primary Cardiologist (physician) and Advanced Practice Providers (APPs -  Physician Assistants and Nurse Practitioners) who all work together to provide you with the care you need, when you need it.  Thank you for choosing [facility] at [facility]!!

## 2018-02-28 ENCOUNTER — Ambulatory Visit: Payer: Medicare Other

## 2018-02-28 ENCOUNTER — Encounter: Payer: Self-pay | Admitting: Podiatry

## 2018-02-28 ENCOUNTER — Ambulatory Visit (INDEPENDENT_AMBULATORY_CARE_PROVIDER_SITE_OTHER): Payer: Medicare Other | Admitting: Podiatry

## 2018-02-28 DIAGNOSIS — L97525 Non-pressure chronic ulcer of other part of left foot with muscle involvement without evidence of necrosis: Principal | ICD-10-CM

## 2018-02-28 DIAGNOSIS — R0683 Snoring: Secondary | ICD-10-CM | POA: Insufficient documentation

## 2018-02-28 DIAGNOSIS — L97521 Non-pressure chronic ulcer of other part of left foot limited to breakdown of skin: Secondary | ICD-10-CM

## 2018-02-28 DIAGNOSIS — K219 Gastro-esophageal reflux disease without esophagitis: Secondary | ICD-10-CM | POA: Insufficient documentation

## 2018-02-28 DIAGNOSIS — E08621 Diabetes mellitus due to underlying condition with foot ulcer: Secondary | ICD-10-CM | POA: Diagnosis not present

## 2018-02-28 DIAGNOSIS — L97515 Non-pressure chronic ulcer of other part of right foot with muscle involvement without evidence of necrosis: Principal | ICD-10-CM

## 2018-02-28 DIAGNOSIS — M5430 Sciatica, unspecified side: Secondary | ICD-10-CM | POA: Insufficient documentation

## 2018-02-28 DIAGNOSIS — E1140 Type 2 diabetes mellitus with diabetic neuropathy, unspecified: Secondary | ICD-10-CM | POA: Insufficient documentation

## 2018-02-28 DIAGNOSIS — E11621 Type 2 diabetes mellitus with foot ulcer: Secondary | ICD-10-CM

## 2018-02-28 DIAGNOSIS — N200 Calculus of kidney: Secondary | ICD-10-CM | POA: Insufficient documentation

## 2018-02-28 NOTE — Patient Instructions (Addendum)
KEEP BOTH FEET DRY  PERFORM ONCE DAILY  1. CLEANSE BOTH FOOT WOUNDS WITH SALINE 2. DRY WITH GAUZE 3. APPLY IODOSORB GEL AND BAND-AID TO WOUNDS   PLEASE BRING BOTH PAIRS OF DIABETIC INSERTS TO YOUR NEXT VISIT!!!!  Diabetes and Foot Care Diabetes may cause you to have problems because of poor blood supply (circulation) to your feet and legs. This may cause the skin on your feet to become thinner, break easier, and heal more slowly. Your skin may become dry, and the skin may peel and crack. You may also have nerve damage in your legs and feet causing decreased feeling in them. You may not notice minor injuries to your feet that could lead to infections or more serious problems. Taking care of your feet is one of the most important things you can do for yourself. Follow these instructions at home:  Wear shoes at all times, even in the house. Do not go barefoot. Bare feet are easily injured.  Check your feet daily for blisters, cuts, and redness. If you cannot see the bottom of your feet, use a mirror or ask someone for help.  Wash your feet with warm water (do not use hot water) and mild soap. Then pat your feet and the areas between your toes until they are completely dry. Do not soak your feet as this can dry your skin.  Apply a moisturizing lotion or petroleum jelly (that does not contain alcohol and is unscented) to the skin on your feet and to dry, brittle toenails. Do not apply lotion between your toes.  Trim your toenails straight across. Do not dig under them or around the cuticle. File the edges of your nails with an emery board or nail file.  Do not cut corns or calluses or try to remove them with medicine.  Wear clean socks or stockings every day. Make sure they are not too tight. Do not wear knee-high stockings since they may decrease blood flow to your legs.  Wear shoes that fit properly and have enough cushioning. To break in new shoes, wear them for just a few hours a day. This  prevents you from injuring your feet. Always look in your shoes before you put them on to be sure there are no objects inside.  Do not cross your legs. This may decrease the blood flow to your feet.  If you find a minor scrape, cut, or break in the skin on your feet, keep it and the skin around it clean and dry. These areas may be cleansed with mild soap and water. Do not cleanse the area with peroxide, alcohol, or iodine.  When you remove an adhesive bandage, be sure not to damage the skin around it.  If you have a wound, look at it several times a day to make sure it is healing.  Do not use heating pads or hot water bottles. They may burn your skin. If you have lost feeling in your feet or legs, you may not know it is happening until it is too late.  Make sure your health care provider performs a complete foot exam at least annually or more often if you have foot problems. Report any cuts, sores, or bruises to your health care provider immediately. Contact a health care provider if:  You have an injury that is not healing.  You have cuts or breaks in the skin.  You have an ingrown nail.  You notice redness on your legs or feet.  You  feel burning or tingling in your legs or feet.  You have pain or cramps in your legs and feet.  Your legs or feet are numb.  Your feet always feel cold. Get help right away if:  There is increasing redness, swelling, or pain in or around a wound.  There is a red line that goes up your leg.  Pus is coming from a wound.  You develop a fever or as directed by your health care provider.  You notice a bad smell coming from an ulcer or wound. This information is not intended to replace advice given to you by your health care provider. Make sure you discuss any questions you have with your health care provider. Document Released: 03/24/2000 Document Revised: 09/02/2015 Document Reviewed: 09/03/2012 Elsevier Interactive Patient Education  2017  [employer]  Diabetic Neuropathy Diabetic neuropathy is a nerve disease or nerve damage that is caused by diabetes mellitus. About half of all people with diabetes mellitus have some form of nerve damage. Nerve damage is more common in those who have had diabetes mellitus for many years and who generally have not had good control of their blood sugar (glucose) level. Diabetic neuropathy is a common complication of diabetes mellitus. There are three common types of diabetic neuropathy and a fourth type that is less common and less understood:  Peripheral neuropathy-This is the most common type of diabetic neuropathy. It causes damage to the nerves of the feet and legs first and then eventually the hands and arms. The damage affects the ability to sense touch.  Autonomic neuropathy-This type causes damage to the autonomic nervous system, which controls the following functions: ? Heartbeat. ? Body temperature. ? Blood pressure. ? Urination. ? Digestion. ? Sweating. ? Sexual function.  Focal neuropathy-Focal neuropathy can be painful and unpredictable and occurs most often in older adults with diabetes mellitus. It involves a specific nerve or one area and often comes on suddenly. It usually does not cause long-term problems.  Radiculoplexus neuropathy- Sometimes called lumbosacral radiculoplexus neuropathy, radiculoplexus neuropathy affects the nerves of the thighs, hips, buttocks, or legs. It is more common in people with type 2 diabetes mellitus and in older men. It is characterized by debilitating pain, weakness, and atrophy, usually in the thigh muscles.  What are the causes? The cause of peripheral, autonomic, and focal neuropathies is diabetes mellitus that is uncontrolled and high glucose levels. The cause of radiculoplexus neuropathy is unknown. However, it is thought to be caused by inflammation related to uncontrolled glucose levels. What are the signs or symptoms? Peripheral  Neuropathy Peripheral neuropathy develops slowly over time. When the nerves of the feet and legs no longer work there may be:  Burning, stabbing, or aching pain in the legs or feet.  Inability to feel pressure or pain in your feet. This can lead to: ? Thick calluses over pressure areas. ? Pressure sores. ? Ulcers.  Foot deformities.  Reduced ability to feel temperature changes.  Muscle weakness.  Autonomic Neuropathy The symptoms of autonomic neuropathy vary depending on which nerves are affected. Symptoms may include:  Problems with digestion, such as: ? Feeling sick to your stomach (nausea). ? Vomiting. ? Bloating. ? Constipation. ? Diarrhea. ? Abdominal pain.  Difficulty with urination. This occurs if you lose your ability to sense when your bladder is full. Problems include: ? Urine leakage (incontinence). ? Inability to empty your bladder completely (retention).  Rapid or irregular heartbeat (palpitations).  Blood pressure drops when you stand up (orthostatic  hypotension). When you stand up you may feel: ? Dizzy. ? Weak. ? Faint.  In men, inability to attain and maintain an erection.  In women, vaginal dryness and problems with decreased sexual desire and arousal.  Problems with body temperature regulation.  Increased or decreased sweating.  Focal Neuropathy  Abnormal eye movements or abnormal alignment of both eyes.  Weakness in the wrist.  Foot drop. This results in an inability to lift the foot properly and abnormal walking or foot movement.  Paralysis on one side of your face (Bell palsy).  Chest or abdominal pain. Radiculoplexus Neuropathy  Sudden, severe pain in your hip, thigh, or buttocks.  Weakness and wasting of thigh muscles.  Difficulty rising from a seated position.  Abdominal swelling.  Unexplained weight loss (usually more than 10 lb [4.5 kg]). How is this diagnosed? Peripheral Neuropathy Your senses may be tested. Sensory  function testing can be done with:  A light touch using a monofilament.  A vibration with tuning fork.  A sharp sensation with a pin prick.  Other tests that can help diagnose neuropathy are:  Nerve conduction velocity. This test checks the transmission of an electrical current through a nerve.  Electromyography. This shows how muscles respond to electrical signals transmitted by nearby nerves.  Quantitative sensory testing. This is used to assess how your nerves respond to vibrations and changes in temperature.  Autonomic Neuropathy Diagnosis is often based on reported symptoms. Tell your health care provider if you experience:  Dizziness.  Constipation.  Diarrhea.  Inappropriate urination or inability to urinate.  Inability to get or maintain an erection.  Tests that may be done include:  Electrocardiography or Holter monitor. These are tests that can help show problems with the heart rate or heart rhythm.  An X-ray exam may be done.  Focal Neuropathy Diagnosis is made based on your symptoms and what your health care provider finds during your exam. Other tests may be done. They may include:  Nerve conduction velocities. This checks the transmission of electrical current through a nerve.  Electromyography. This shows how muscles respond to electrical signals transmitted by nearby nerves.  Quantitative sensory testing. This test is used to assess how your nerves respond to vibration and changes in temperature.  Radiculoplexus Neuropathy  Often the first thing is to eliminate any other issue or problems that might be the cause, as there is no standard test for diagnosis.  X-ray exam of your spine and lumbar region.  Spinal tap to rule out cancer.  MRI to rule out other lesions. How is this treated? Once nerve damage occurs, it cannot be reversed. The goal of treatment is to keep the disease or nerve damage from getting worse and affecting more nerve fibers.  Controlling your blood glucose level is the key. Most people with radiculoplexus neuropathy see at least a partial improvement over time. You will need to keep your blood glucose and HbA1c levels in the target range determined by your health care provider. Things that help control blood glucose levels include:  Blood glucose monitoring.  Meal planning.  Physical activity.  Diabetes medicine.  Over time, maintaining lower blood glucose levels helps lessen symptoms. Sometimes, prescription pain medicine is needed. Follow these instructions at home:  Do not smoke.  Keep your blood glucose level in the range that you and your health care provider have determined acceptable for you.  Keep your blood pressure level in the range that you and your health care provider have determined  acceptable for you.  Eat a well-balanced diet.  Be physically active every day. Include strength training and balance exercises.  Protect your feet. ? Check your feet every day for sores, cuts, blisters, or signs of infection. ? Wear padded socks and supportive shoes. Use orthotic inserts, if necessary. ? Regularly check the insides of your shoes for worn spots. Make sure there are no rocks or other items inside your shoes before you put them on. Contact a health care provider if:  You have burning, stabbing, or aching pain in the legs or feet.  You are unable to feel pressure or pain in your feet.  You develop problems with digestion such as: ? Nausea. ? Vomiting. ? Bloating. ? Constipation. ? Diarrhea. ? Abdominal pain.  You have difficulty with urination, such as: ? Incontinence. ? Retention.  You have palpitations.  You develop orthostatic hypotension. When you stand up you may feel: ? Dizzy. ? Weak. ? Faint.  You cannot attain and maintain an erection (in men).  You have vaginal dryness and problems with decreased sexual desire and arousal (in women).  You have severe pain in your  thighs, legs, or buttocks.  You have unexplained weight loss. This information is not intended to replace advice given to you by your health care provider. Make sure you discuss any questions you have with your health care provider. Document Released: 06/05/2001 Document Revised: 09/02/2015 Document Reviewed: 09/05/2012 Elsevier Interactive Patient Education  2017 [employer].

## 2018-03-04 ENCOUNTER — Telehealth: Payer: Self-pay | Admitting: Cardiovascular Disease

## 2018-03-04 ENCOUNTER — Telehealth: Payer: Self-pay | Admitting: Podiatry

## 2018-03-04 NOTE — Telephone Encounter (Signed)
Pt was seen last Thursday and had callus' removed. Pt is now experiencing pain, having trouble walking and is unable to put shoes on. Please give patient a call.

## 2018-03-04 NOTE — Pre-Procedure Instructions (Addendum)
Jon Anderson  03/04/2018      [id number] - [place], [place] - [address] [place] [address] Phone: [phone] Fax: [phone]    Your procedure is scheduled on Thursday December 5th.  Report to [facility] Admitting at [place].  Call this number if you have problems the morning of surgery:  [phone]   Remember:  Do not eat or drink after midnight.    Take these medicines the morning of surgery with A SIP OF WATER   acetaminophen (TYLENOL) if needed  cyclobenzaprine (FLEXERIL)  If needed  gabapentin (NEURONTIN)  pantoprazole (PROTONIX)   rOPINIRole (REQUIP)  Follow your surgeon's instructions for when to stop/resume your clopidogrel (PLAVIX) and Aspirin. If no instructions were given to you, call your surgeon's office.   7 days prior to surgery STOP taking any diclofenac (VOLTAREN), Aspirin(unless otherwise instructed by your surgeon), Aleve, Naproxen, Ibuprofen, Motrin, Advil, Goody's, BC's, all herbal medications, fish oil, and all vitamins   How to Manage Your Diabetes Before and After Surgery  Why is it important to control my blood sugar before and after surgery? . Improving blood sugar levels before and after surgery helps healing and can limit problems. . A way of improving blood sugar control is eating a healthy diet by: o  Eating less sugar and carbohydrates o  Increasing activity/exercise o  Talking with your doctor about reaching your blood sugar goals . High blood sugars (greater than 180 mg/dL) can raise your risk of infections and slow your recovery, so you will need to focus on controlling your diabetes during the weeks before surgery. . Make sure that the doctor who takes care of your diabetes knows about your planned surgery including the date and location.  How do I manage my blood sugar before surgery? . Check your blood sugar at least 4 times a  day, starting 2 days before surgery, to make sure that the level is not too high or low. o Check your blood sugar the morning of your surgery when you wake up and every 2 hours until you get to the Short Stay unit. . If your blood sugar is less than 70 mg/dL, you will need to treat for low blood sugar: o Do not take insulin. o Treat a low blood sugar (less than 70 mg/dL) with  cup of clear juice (cranberry or apple), 4 glucose tablets, OR glucose gel. o Recheck blood sugar in 15 minutes after treatment (to make sure it is greater than 70 mg/dL). If your blood sugar is not greater than 70 mg/dL on recheck, call [phone] for further instructions. . Report your blood sugar to the short stay nurse when you get to Short Stay.  . If you are admitted to the hospital after surgery: o Your blood sugar will be checked by the staff and you will probably be given insulin after surgery (instead of oral diabetes medicines) to make sure you have good blood sugar levels. o The goal for blood sugar control after surgery is 80-180 mg/dL     Do not wear jewelry.  Do not wear lotions, powders, or colognes, or deodorant.  Men may shave face and neck.  Do not bring valuables to the hospital.  [facility] is not responsible for any belongings or valuables.  Contacts, dentures or bridgework may not be worn into surgery.  Leave your suitcase in the car.  After surgery it  may be brought to your room.  For patients admitted to the hospital, discharge time will be determined by your treatment team.  Patients discharged the day of surgery will not be allowed to drive home.    [place]- Preparing For Surgery  Before surgery, you can play an important role. Because skin is not sterile, your skin needs to be as free of germs as possible. You can reduce the number of germs on your skin by washing with CHG (chlorahexidine gluconate) Soap before surgery.  CHG is an antiseptic cleaner which kills germs and bonds  with the skin to continue killing germs even after washing.    Oral Hygiene is also important to reduce your risk of infection.  Remember - BRUSH YOUR TEETH THE MORNING OF SURGERY WITH YOUR REGULAR TOOTHPASTE  Please do not use if you have an allergy to CHG or antibacterial soaps. If your skin becomes reddened/irritated stop using the CHG.  Do not shave (including legs and underarms) for at least 48 hours prior to first CHG shower. It is OK to shave your face.  Please follow these instructions carefully.   1. Shower the NIGHT BEFORE SURGERY and the MORNING OF SURGERY with CHG.   2. If you chose to wash your hair, wash your hair first as usual with your normal shampoo.  3. After you shampoo, rinse your hair and body thoroughly to remove the shampoo.  4. Use CHG as you would any other liquid soap. You can apply CHG directly to the skin and wash gently with a scrungie or a clean washcloth.   5. Apply the CHG Soap to your body ONLY FROM THE NECK DOWN.  Do not use on open wounds or open sores. Avoid contact with your eyes, ears, mouth and genitals (private parts). Wash Face and genitals (private parts)  with your normal soap.  6. Wash thoroughly, paying special attention to the area where your surgery will be performed.  7. Thoroughly rinse your body with warm water from the neck down.  8. DO NOT shower/wash with your normal soap after using and rinsing off the CHG Soap.  9. Pat yourself dry with a CLEAN TOWEL.  10. Wear CLEAN PAJAMAS to bed the night before surgery, wear comfortable clothes the morning of surgery  11. Place CLEAN SHEETS on your bed the night of your first shower and DO NOT SLEEP WITH PETS.    Day of Surgery:  Do not apply any deodorants/lotions.  Please wear clean clothes to the hospital/surgery center.   Remember to brush your teeth WITH YOUR REGULAR TOOTHPASTE.    Please read over the following fact sheets that you were given.

## 2018-03-04 NOTE — Telephone Encounter (Signed)
Pt states Dr. Elisha Ponder told him to change the bandage every daily and it is still sore and red. I asked pt if there was drainage and he said some and it is not getting better. I told pt that it was new skin beneath the callous and would be tender but should be improving. Pt states it is not improving. I offered pt an appt prior to the holiday and he states he would like that since he didn't want to hurt over the holiday either.

## 2018-03-04 NOTE — Telephone Encounter (Signed)
Received records from Dr [name], MRCP on 03/04/18, Appt 04/16/18 @ 10:30AM.[place]

## 2018-03-05 ENCOUNTER — Encounter (HOSPITAL_COMMUNITY)
Admission: RE | Admit: 2018-03-05 | Discharge: 2018-03-05 | Disposition: A | Discharge status: Home / Self Care | Payer: Medicare Other | Source: Ambulatory Visit | Attending: Neurosurgery | Admitting: Neurosurgery

## 2018-03-05 ENCOUNTER — Other Ambulatory Visit (HOSPITAL_COMMUNITY): Payer: Medicare Other

## 2018-03-05 ENCOUNTER — Other Ambulatory Visit: Payer: Self-pay

## 2018-03-05 ENCOUNTER — Encounter (HOSPITAL_COMMUNITY): Payer: Self-pay

## 2018-03-05 DIAGNOSIS — Z01812 Encounter for preprocedural laboratory examination: Secondary | ICD-10-CM | POA: Diagnosis not present

## 2018-03-05 DIAGNOSIS — E119 Type 2 diabetes mellitus without complications: Secondary | ICD-10-CM | POA: Insufficient documentation

## 2018-03-05 LAB — BASIC METABOLIC PANEL
Anion gap: 10 (ref 5–15)
BUN: 10 mg/dL (ref 8–23)
CO2: 30 mmol/L (ref 22–32)
Calcium: 8.4 mg/dL — ABNORMAL LOW (ref 8.9–10.3)
Chloride: 94 mmol/L — ABNORMAL LOW (ref 98–111)
Creatinine, Ser: 5.81 mg/dL — ABNORMAL HIGH (ref 0.61–1.24)
GFR calc Af Amer: 11 mL/min — ABNORMAL LOW (ref >60)
GFR calc non Af Amer: 9 mL/min — ABNORMAL LOW (ref >60)
Glucose, Bld: 111 mg/dL — ABNORMAL HIGH (ref 70–99)
Potassium: 3.4 mmol/L — ABNORMAL LOW (ref 3.5–5.1)
Sodium: 134 mmol/L — ABNORMAL LOW (ref 135–145)

## 2018-03-05 LAB — CBC
HCT: 37.5 % — ABNORMAL LOW (ref 39.0–52.0)
Hemoglobin: 11.8 g/dL — ABNORMAL LOW (ref 13.0–17.0)
MCH: 30.5 pg (ref 26.0–34.0)
MCHC: 31.5 g/dL (ref 30.0–36.0)
MCV: 96.9 fL (ref 80.0–100.0)
Platelets: 224 10*3/uL (ref 150–400)
RBC: 3.87 MIL/uL — ABNORMAL LOW (ref 4.22–5.81)
RDW: 13.2 % (ref 11.5–15.5)
WBC: 5.6 10*3/uL (ref 4.0–10.5)
nRBC: 0 % (ref 0.0–0.2)

## 2018-03-05 LAB — SURGICAL PCR SCREEN
MRSA, PCR: NEGATIVE
Staphylococcus aureus: NEGATIVE

## 2018-03-05 LAB — GLUCOSE, CAPILLARY: Glucose-Capillary: 102 mg/dL — ABNORMAL HIGH (ref 70–99)

## 2018-03-05 LAB — ABO/RH: ABO/RH(D): A POS

## 2018-03-05 NOTE — Progress Notes (Signed)
PCP - Dr. [name] Cardiologist -  Dr. Gwenlyn Found  Chest x-ray - 02/25/18 EKG - 02/26/18 Stress Test - 2013 ECHO - 2019 Cardiac Cath - 2019  Sleep Study - 2018 CPAP -  Doesn't use  Fasting Blood Sugar - 80s Checks Blood Sugar 2-3 times a day  Blood Thinner Instructions:stopping 7 days prior Aspirin Instructions: stopping 7 days prior  Anesthesia review: Hx CAD, EKG review  Patient denies shortness of breath, fever, cough and chest pain at PAT appointment   Patient verbalized understanding of instructions that were given to them at the PAT appointment. Patient was also instructed that they will need to review over the PAT instructions again at home before surgery.

## 2018-03-06 ENCOUNTER — Ambulatory Visit (INDEPENDENT_AMBULATORY_CARE_PROVIDER_SITE_OTHER): Payer: Medicare Other | Admitting: Podiatry

## 2018-03-06 ENCOUNTER — Telehealth: Payer: Self-pay | Admitting: *Deleted

## 2018-03-06 DIAGNOSIS — E08621 Diabetes mellitus due to underlying condition with foot ulcer: Secondary | ICD-10-CM | POA: Diagnosis not present

## 2018-03-06 DIAGNOSIS — L97521 Non-pressure chronic ulcer of other part of left foot limited to breakdown of skin: Secondary | ICD-10-CM | POA: Diagnosis not present

## 2018-03-06 NOTE — Progress Notes (Signed)
Anesthesia Chart Review:  Case:  [id number] Date/Time:  03/14/18 0715   Procedure:  POSTERIOR LUMBAR INTERBODY FUSION LUMBAR 4- LUMBAR 5, LUMBAR 5- SACRAL 1, INTERBODY DEVICE,INTERBODY ARTHRODESIS,POSTERIOR SEGMENTAL INSTRUMENTATION (N/A ) - POSTERIOR LUMBAR INTERBODY FUSION LUMBAR 4- LUMBAR 5, LUMBAR 5- SACRAL 1, INTERBODY DEVICE,INTERBODY ARTHRODESIS,POSTERIOR SEGMENTAL INSTRUMENTATION   Anesthesia type:  General   Pre-op diagnosis:  SPONDYLOLISTHESIS, LUMBAR REGION   Location:  [facility] OR ROOM 71 / [place] [facility]   Surgeon:  Consuella Lose, MD      DISCUSSION: Patient is a 67 year old male scheduled for the above procedure.   History includes former smoker, CAD (s/p off-pump CABG via left thoracotomy, LIMA-LAD 01/30/17, [facility]; patent LIMA-LAD 04/27/17, microvascular angina was a consideration for anginal symptoms), diastolic CHF, HTN, HLD, ESRD (HD MWF, left AVF), GERD, DM2, anemia of chronic disease, PAD (s/p PTCA/DES left SFA 12/11/13), carotid artery stenosis, moderate mitral stenosis (02/2018 echo), OSA (not using CPAP), back surgery (L5-S1 microdiscectomy 07/03/17, redo right L5-S1 laminotomy/microdiscectomy 10/19/17). - ED evaluation 02/25/18 for right arm pain and paresthesia. Troponin 0.08, so cardiology called Harrington Challenger, Nevin Bloodgood, MD) who felt results were related to ESRD and no cardiac testing recommended. - Admission 02/22/18-02/23/18 for SOB with mild chest pain in the setting of incomplete hemodialysis treatments (due to not being able to lie still for prolonged treatments due to back pain). CTA negative for PE. Mildly elevated and flat troponins. Cardiology consulted Radford Pax, Tressia Miners, MD) and felt due to ESRD. Suspected symptoms due to being under-dialyzed having missed a full session followed by only 1/2 session a few days later. Echo showed normal LVEF and wall motion.   He has had recent cardiology follow-up following his 02/2018 hospital/ED evaluations. Future testing for PAD ordered, but no new cardiac  testing. If no acute changes then I would anticipate that he can proceed as planned. (Back on 01/28/18, Dr. Gwenlyn Found gave permission to hold antiplatelet agents for back surgery.") Patient reported instructions to stop Plavix/ASA 7 days prior to surgery.   VS: BP (!) 100/59   Pulse 91   Temp 36.8 C   Resp 18   Ht 5\' 11"  (1.803 m)   SpO2 99%   BMI 22.54 kg/m   PROVIDERS: [name], MD is PCP. A preoperative clearance letter addressed to Dr. Mellody Drown categorized patient as "moderate risk" for surgery.  Donato Heinz, MD is nephrologist - [name], MD is cardiologist. Last visit wti Jory Sims, NP on 02/27/18. She was aware of surgery plans. Bilateral LE Doppler with aorto-iliac U/S ordered with plan for PAD follow-up with Dr. Gwenlyn Found. Testing scheduled for 04/09/18 and appointment with Dr. Gwenlyn Found 04/16/18. He also had seen cardiologist Freddi Starr, MD with [facility] before and after his 2018 CABG.   LABS: PAT labs reviewed. He will need ISTAT4 on the day of surgery due to ESRD. (all labs ordered are listed, but only abnormal results are displayed)  Labs Reviewed  GLUCOSE, CAPILLARY - Abnormal; Notable for the following components:      Result Value   Glucose-Capillary 102 (*)    All other components within normal limits  CBC - Abnormal; Notable for the following components:   RBC 3.87 (*)    Hemoglobin 11.8 (*)    HCT 37.5 (*)    All other components within normal limits  BASIC METABOLIC PANEL - Abnormal; Notable for the following components:   Sodium 134 (*)    Potassium 3.4 (*)    Chloride 94 (*)    Glucose, Bld  111 (*)    Creatinine, Ser 5.81 (*)    Calcium 8.4 (*)    GFR calc non Af Amer 9 (*)    GFR calc Af Amer 11 (*)    All other components within normal limits  SURGICAL PCR SCREEN  TYPE AND SCREEN  ABO/RH    IMAGES: CXR 02/25/18: IMPRESSION: No active cardiopulmonary disease.  CTA chest 02/22/18: IMPRESSION: 1. No acute cardiopulmonary  disease. 2.  Aortic Atherosclerosis (ICD10-I70.0). 3. Multivessel coronary artery atherosclerosis.  MRI L-spine 01/23/18: IMPRESSION: - Status post revision of right laminotomy at L5-S1. Mass effect on the right side of the thecal sac and right S1 root without compression does not appear grossly changed compared to the prior examination. Obliteration of fat about the exiting right L5 root is difficult to definitively characterized but is likely due to granulation tissue/epidural scar. No definite disc is seen in the foramen. - No change in spondylosis most notable at L4-5 where bulging disc and anterolisthesis result in moderately severe to severe left foraminal narrowing. The exiting left L5 root appears compressed in the foramen. There is mild central canal stenosis at this level.   EKG: 02/25/18: SR with occasional PVCs. Anteroseptal infarct (age undetermined).    CV: Echo 02/23/18: Study Conclusions - Left ventricle: The cavity size was normal. There was severe   concentric hypertrophy. Systolic function was normal. The   estimated ejection fraction was in the range of 60% to 65%. Wall   motion was normal; there were no regional wall motion   abnormalities. There was an increased relative contribution of   atrial contraction to ventricular filling. Doppler parameters are   consistent with abnormal left ventricular relaxation (grade 1   diastolic dysfunction). Doppler parameters are consistent with   high ventricular filling pressure. - Aortic valve: Trileaflet; mildly thickened, mildly calcified   leaflets. Valve area (VTI): 1.99 cm^2. Valve area (Vmax): 2.12   cm^2. Valve area (Vmean): 2.02 cm^2. - Mitral valve: Severely calcified annulus. Moderate diffuse   thickening and calcification of the anterior leaflet and   posterior leaflet. Mobility of the posterior leaflet was   restricted to the point of immobility. The findings are   consistent with moderate stenosis. There  was mild regurgitation.   Mean gradient (D): 7 mm Hg. Valve area by continuity equation   (using LVOT flow): 1.11 cm^2. - Left atrium: The atrium was mildly dilated. - Pulmonary arteries: Systolic pressure could not be accurately   estimated as IVC is not visualized. Impressions: - The right ventricular systolic pressure was increased consistent   with mild pulmonary hypertension.  ABI's 12/11/17: Final Interpretation: - Right: Resting right ankle-brachial index indicates noncompressible right lower extremity arteries.The right toe-brachial index is abnormal. PPG tracings appear dampened. - Left: Resting left ankle-brachial index indicates noncompressible left lower extremity arteries.The left toe-brachial index is abnormal. PPG tracings appear dampened. Recommendation: Suggest follow up arterial duplex and ABI's in 07/2018 per Dr. Kennon Holter recommendation.  CTA neck 06/28/17: IMPRESSION: - Atherosclerotic plaque right carotid bifurcation. No significant internal carotid artery stenosis on the right. 50% diameter stenosis proximal left external carotid artery. Atherosclerotic plaque throughout the right cavernous carotid. - 60% diameter stenosis distal left common carotid artery and 30% diameter stenosis proximal left internal carotid artery. Moderate to severe stenosis proximal left external carotid artery. Extensive atherosclerotic disease left cavernous carotid. - Scattered disease throughout the right vertebral artery which is patent. Severe stenosis at the origin of the left vertebral artery which is patent to  the basilar.  Carotid U/S 06/26/17: IMPRESSION: - Less than 50% stenosis in the right internal carotid artery. - Greater than 70% stenosis in the left internal carotid artery (ICA: 683 cm/sec; systolic ICA/CCA ratio: 4.7). - RIGHT CAROTID ARTERY: Prominent calcified plaque along the mid and upper common carotid artery. Moderate plaque in the bulb. Low resistance internal carotid  Doppler pattern. - RIGHT VERTEBRAL ARTERY: Antegrade. - LEFT CAROTID ARTERY: Extensive irregular mixed calcified and soft plaque in the left bulb. Low resistance internal carotid Doppler pattern. - LEFT VERTEBRAL ARTERY: Antegrade. (CTA neck recommended, see above.)  Cardiac cath 04/27/17:  Prox LAD lesion is 75% stenosed. -Focal lesion that was previously described.  LIMA-LAD is widely patent and is normal in caliber. There is competitive flow.  Otherwise minimal disease throughout. Nothing made greater than 40% in the ostial circumflex.  The left ventricular systolic function is normal. The left ventricular ejection fraction is 50-55% by visual estimate.  LV end diastolic pressure is low. - ~0-3 mmHg  There is no aortic valve stenosis. Angiographically no culprit lesion to explain the patient's symptoms. He does have a significant LAD lesion which is a very focal lesion and easily stent pull, however there is a widely patent LIMA graft distally. There is actually retrograde filling from the LIMA graft to the diagonal branch which would be the only branch jeopardized by the more upstream LAD lesion. Nothing to explain the patient's symptoms. In fact, his LVEDP is very low after dialysis. This would indicate that he was adequately dialyzed and argue against increased LVEDP causing microvascular ischemic symptoms.   Past Medical History:  Diagnosis Date  . Anemia of chronic disease   . CAD (coronary artery disease)    a.  Myoview 4/11: EF 53%, no scar or ischemia   c. MV 2012 Nl perfusion, apical thinning.  No ischemia or scar.  EF 49%, appears greater by visual estimate.;  d.  Dob stress echo 12/13:  Negative Dob stress echo. There is no evidence of ischemia.  The LVF is normal. b. Normal cors 2016.  . Carotid stenosis    a. <41% RICA, >96% LICA by duplex 05/2295  . Chronic chest pain    occ  . ESRD (end stage renal disease) on dialysis ([place])    M-W-F  . GERD (gastroesophageal  reflux disease)   . HNP (herniated nucleus pulposus), lumbar   . HTN (hypertension)    echo 3/10: EF 60%, LAE  . Hyperlipidemia   . Nephrolithiasis    "passed them all"  . Peripheral arterial disease ([place])    a. s/p PTCA to L SFA.  Marland Kitchen Pneumonia   . Restless legs   . Sleep apnea    no cpap, needs to reschedule appointment to set up aquiring cpap  . Snores    a. presumed OSA, pt has refused sleep eval in past.  . Type II diabetes mellitus ([place])    no longer on medications, checks blood glucose at home    Past Surgical History:  Procedure Laterality Date  . ANGIOPLASTY / STENTING FEMORAL Left 12/11/2013   dr berry  . AV FISTULA PLACEMENT Left 03/19/2014   Procedure: CREATION OF ARTERIOVENOUS (AV) FISTULA  LEFT UPPER ARM;  Surgeon: Mal Misty, MD;  Location: [place];  Service: Vascular;  Laterality: Left;  . CARDIAC CATHETERIZATION  2001 and 2010   . COLONOSCOPY W/ BIOPSIES AND POLYPECTOMY    . COLONOSCOPY WITH PROPOFOL N/A 08/01/2016   Procedure: COLONOSCOPY WITH PROPOFOL;  Surgeon: Carol Ada, MD;  Location: [facility];  Service: Endoscopy;  Laterality: N/A;  . CORONARY ARTERY BYPASS GRAFT  2018  . ESOPHAGOGASTRODUODENOSCOPY (EGD) WITH PROPOFOL N/A 08/01/2016   Procedure: ESOPHAGOGASTRODUODENOSCOPY (EGD) WITH PROPOFOL;  Surgeon: Carol Ada, MD;  Location: [facility];  Service: Endoscopy;  Laterality: N/A;  . FOOT FRACTURE SURGERY Right    ligament repair  . FRACTURE SURGERY    . INGUINAL HERNIA REPAIR Left   . LEFT HEART CATH AND CORS/GRAFTS ANGIOGRAPHY N/A 04/27/2017   Procedure: LEFT HEART CATH AND CORS/GRAFTS ANGIOGRAPHY;  Surgeon: Leonie Man, MD;  Location: [place] [facility];  Service: Cardiovascular;  Laterality: N/A;  . LEFT HEART CATHETERIZATION WITH CORONARY ANGIOGRAM N/A 06/22/2014   Procedure: LEFT HEART CATHETERIZATION WITH CORONARY ANGIOGRAM;  Surgeon: Troy Sine, MD;  Location: [facility] CATH LAB;  Service: Cardiovascular;  Laterality: N/A;  . LOWER  EXTREMITY ANGIOGRAM Left 12/11/2013   Procedure: LOWER EXTREMITY ANGIOGRAM;  Surgeon: Lorretta Harp, MD;  Location: [facility] CATH LAB;  Service: Cardiovascular;  Laterality: Left;  . LUMBAR LAMINECTOMY/DECOMPRESSION MICRODISCECTOMY Right 07/03/2017   Procedure: MICRODISCECTOMY LUMBAR FIVE - SACRAL ONE RIGHT;  Surgeon: Consuella Lose, MD;  Location: [place];  Service: Neurosurgery;  Laterality: Right;  . LUMBAR LAMINECTOMY/DECOMPRESSION MICRODISCECTOMY Right 10/19/2017   Procedure: MICRODISCECTOMY LUMBAR FIVE- SACRAL 1 ONE ;  Surgeon: Consuella Lose, MD;  Location: [place];  Service: Neurosurgery;  Laterality: Right;  . TONSILLECTOMY AND ADENOIDECTOMY    . WISDOM TOOTH EXTRACTION      MEDICATIONS: . acetaminophen (TYLENOL) 500 MG tablet  . aspirin EC 81 MG tablet  . atorvastatin (LIPITOR) 40 MG tablet  . calcium acetate (PHOSLO) 667 MG capsule  . clopidogrel (PLAVIX) 75 MG tablet  . cyclobenzaprine (FLEXERIL) 5 MG tablet  . diclofenac (VOLTAREN) 75 MG EC tablet  . gabapentin (NEURONTIN) 100 MG capsule  . multivitamin (RENA-VIT) TABS tablet  . nitroGLYCERIN (NITROSTAT) 0.4 MG SL tablet  . pantoprazole (PROTONIX) 40 MG tablet  . polyethylene glycol (MIRALAX / GLYCOLAX) packet  . rOPINIRole (REQUIP) 0.5 MG tablet   No current facility-administered medications for this encounter.     George [facility]/Anesthesiology Phone [phone] 03/06/2018 1:14 PM

## 2018-03-06 NOTE — Anesthesia Preprocedure Evaluation (Addendum)
Anesthesia Evaluation  Patient identified by MRN, date of birth, ID band Patient awake    Reviewed: Allergy & Precautions, NPO status , Patient's Chart, lab work & pertinent test results  History of Anesthesia Complications (+) history of anesthetic complications  Airway Mallampati: II  TM Distance: >3 FB Neck ROM: Full    Dental  (+) Edentulous Upper, Edentulous Lower, Dental Advisory Given   Pulmonary former smoker,    Pulmonary exam normal        Cardiovascular hypertension, + CAD, + CABG and + Peripheral Vascular Disease  Normal cardiovascular exam  Study Conclusions  - Left ventricle: The cavity size was normal. There was severe   concentric hypertrophy. Systolic function was normal. The   estimated ejection fraction was in the range of 60% to 65%. Wall   motion was normal; there were no regional wall motion   abnormalities. There was an increased relative contribution of   atrial contraction to ventricular filling. Doppler parameters are   consistent with abnormal left ventricular relaxation (grade 1   diastolic dysfunction). Doppler parameters are consistent with   high ventricular filling pressure. - Aortic valve: Trileaflet; mildly thickened, mildly calcified   leaflets. Valve area (VTI): 1.99 cm^2. Valve area (Vmax): 2.12   cm^2. Valve area (Vmean): 2.02 cm^2. - Mitral valve: Severely calcified annulus. Moderate diffuse   thickening and calcification of the anterior leaflet and   posterior leaflet. Mobility of the posterior leaflet was   restricted to the point of immobility. The findings are   consistent with moderate stenosis. There was mild regurgitation.   Mean gradient (D): 7 mm Hg. Valve area by continuity equation   (using LVOT flow): 1.11 cm^2. - Left atrium: The atrium was mildly dilated. - Pulmonary arteries: Systolic pressure could not be accurately   estimated as IVC is not  visualized.  Impressions:  - The right ventricular systolic pressure was increased consistent   with mild pulmonary hypertension.  Cardiac cath 04/27/17:  Prox LAD lesion is 75% stenosed. -Focal lesion that was previously described.  LIMA-LAD is widely patent and is normal in caliber. There is competitive flow.  Otherwise minimal disease throughout. Nothing made greater than 40% in the ostial circumflex.  The left ventricular systolic function is normal. The left ventricular ejection fraction is 50-55% by visual estimate.  LV end diastolic pressure is low. - ~0-3 mmHg  There is no aortic valve stenosis. Angiographically no culprit lesion to explain the patient's symptoms. He does have a significant LAD lesion which is a very focal lesion and easily stent pull, however there is a widely patent LIMA graft distally. There is actually retrograde filling from the LIMA graft to the diagonal branch which would be the only branch jeopardized by the more upstream LAD lesion. Nothing to explain the patient's symptoms. In fact, his LVEDP is very low after dialysis. This would indicate that he was adequately dialyzed and argue against increased LVEDP causing microvascular ischemic symptoms.    Neuro/Psych negative psych ROS   GI/Hepatic Neg liver ROS, GERD  ,  Endo/Other  negative endocrine ROSdiabetes  Renal/GU Dialysis and ESRFRenal disease     Musculoskeletal negative musculoskeletal ROS (+)   Abdominal   Peds  Hematology negative hematology ROS (+)   Anesthesia Other Findings Day of surgery medications reviewed with the patient.  Reproductive/Obstetrics  Anesthesia Evaluation  Patient identified by MRN, date of birth, ID band Patient awake    Reviewed: Allergy & Precautions, NPO status , Patient's Chart, lab work & pertinent test results  Airway Mallampati: I  TM Distance: >3  FB Neck ROM: Full    Dental no notable dental hx.    Pulmonary sleep apnea , former smoker,    Pulmonary exam normal breath sounds clear to auscultation       Cardiovascular hypertension, Pt. on medications + CAD  Normal cardiovascular exam Rhythm:Regular Rate:Normal     Neuro/Psych negative neurological ROS  negative psych ROS   GI/Hepatic Neg liver ROS, GERD  Medicated and Controlled,  Endo/Other  diabetes, Type 2, Insulin Dependent  Renal/GU ESRF and DialysisRenal disease  negative genitourinary   Musculoskeletal negative musculoskeletal ROS (+)   Abdominal   Peds negative pediatric ROS (+)  Hematology negative hematology ROS (+)   Anesthesia Other Findings   Reproductive/Obstetrics negative OB ROS                             Anesthesia Physical  Anesthesia Plan  ASA: III  Anesthesia Plan: General   Post-op Pain Management:    Induction: Intravenous  PONV Risk Score and Plan: 2 and Ondansetron, Treatment may vary due to age or medical condition and Midazolam  Airway Management Planned: Oral ETT  Additional Equipment:   Intra-op Plan:   Post-operative Plan: Extubation in OR  Informed Consent: I have reviewed the patients History and Physical, chart, labs and discussed the procedure including the risks, benefits and alternatives for the proposed anesthesia with the patient or authorized representative who has indicated his/her understanding and acceptance.     Plan Discussed with: CRNA and Surgeon  Anesthesia Plan Comments:         Anesthesia Quick Evaluation                                   Anesthesia Evaluation  Patient identified by MRN, date of birth, ID band Patient awake    Reviewed: Allergy & Precautions, NPO status , Patient's Chart, lab work & pertinent test results  Airway Mallampati: I  TM Distance: >3 FB Neck ROM: Full    Dental no notable dental hx.    Pulmonary sleep  apnea , former smoker,    Pulmonary exam normal breath sounds clear to auscultation       Cardiovascular hypertension, Pt. on medications + CAD  Normal cardiovascular exam Rhythm:Regular Rate:Normal     Neuro/Psych negative neurological ROS  negative psych ROS   GI/Hepatic Neg liver ROS, GERD  Medicated and Controlled,  Endo/Other  diabetes, Type 2, Insulin Dependent  Renal/GU ESRF and DialysisRenal disease  negative genitourinary   Musculoskeletal negative musculoskeletal ROS (+)   Abdominal   Peds negative pediatric ROS (+)  Hematology negative hematology ROS (+)   Anesthesia Other Findings   Reproductive/Obstetrics negative OB ROS                             Anesthesia Physical  Anesthesia Plan  ASA: III  Anesthesia Plan: General   Post-op Pain Management:    Induction: Intravenous  PONV Risk Score and Plan: 2 and Ondansetron, Treatment may vary due to age or medical condition and Midazolam  Airway Management Planned: Oral ETT  Additional Equipment:  Intra-op Plan:   Post-operative Plan: Extubation in OR  Informed Consent: I have reviewed the patients History and Physical, chart, labs and discussed the procedure including the risks, benefits and alternatives for the proposed anesthesia with the patient or authorized representative who has indicated his/her understanding and acceptance.     Plan Discussed with: CRNA and Surgeon  Anesthesia Plan Comments:         Anesthesia Quick Evaluation  Anesthesia Physical Anesthesia Plan  ASA: III  Anesthesia Plan: General   Post-op Pain Management:    Induction: Intravenous  PONV Risk Score and Plan: 3 and Ondansetron, Dexamethasone and Diphenhydramine  Airway Management Planned: Oral ETT  Additional Equipment:   Intra-op Plan:   Post-operative Plan: Extubation in OR  Informed Consent: I have reviewed the patients History and Physical, chart, labs and  discussed the procedure including the risks, benefits and alternatives for the proposed anesthesia with the patient or authorized representative who has indicated his/her understanding and acceptance.   Dental advisory given  Plan Discussed with: CRNA and Anesthesiologist  Anesthesia Plan Comments: (PAT note written 03/06/2018 by Myra Gianotti, PA-C. )      Anesthesia Quick Evaluation

## 2018-03-06 NOTE — Progress Notes (Signed)
Subjective:   Patient presents with daughter today for continued care of ulceration of bilateral feet. Statesit is  painful to walk on left foot.   Denies any odor from wound, no drainage, no nausea, vomiting, fevers or nor chills.    He has been changing his dressings daily with the Iodosorb Gel.  He did bring in his diabetic inserts on today.  He is scheduled to have back surgery on March 14, 2018.   Objective:   Superficial ulcerations located plantar 5th metatarsal head b/l.    Left foot ulceration is dime sized with visible red, granular base, very tender to palpation. No surrounding erythema, no edema, no drainage. No lymphangitis, no tunneling nor undermining.  No probing to bone, no purulence noted.    Right foot ulceration nearly healed with tenderness to palpation. +Epithelialization  He has offloaded inserts in left foot for submet head 5 lesion and right foot for submet heads 1, 5.  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  Diabetic ulceration submet head 5 right foot, nearly healed.  Plan: Patient seen and with Dr. Paulla Anderson.  Dr. Paulla Anderson who feels Jon Anderson really needs 5th metatarsal head resection to resolve this lesion. He has a  plantarflexed metatarsal head with loss of fat pad. He would need to be cleared by Dr. Gwenlyn Anderson for this.  He is also having back surgery on December 5th. He has dialysis on MWF. He will follow up with [place] on Tuesday, December 3rd. Jon Anderson, our Pedorthist will be here to modify his insoles again.  Debridement of surrounding hyperkeratosis left foot ulcer performed. Ulcer cleansed with wound cleanser. Medihoney and light dressing applied. Continue Iodosorb Gel changes daily to both areas b/l.   A new pair of diabetic inserts were offloaded in area of 5th metatarsal heads. Minimal relief noted when weightbearing. Discussed with daughter he needs to decrease his weightbearing for the next few days. Keep feet dry.

## 2018-03-06 NOTE — Patient Instructions (Signed)

## 2018-03-06 NOTE — Telephone Encounter (Signed)
-----   Message from Marzetta Board, DPM sent at 03/06/2018  7:39 AM EST ----- Regarding: Bring Diabetic Inserts to Appointment today Patient has appointment this afternoon. Please advise him to bring both pairs of diabetic inserts to appointment on today.  Thanks!

## 2018-03-06 NOTE — Telephone Encounter (Signed)
I informed pt of Dr. Heber [place] request and reminded of his 1:30pm appt.

## 2018-03-07 ENCOUNTER — Encounter: Payer: Self-pay | Admitting: Podiatry

## 2018-03-07 NOTE — Progress Notes (Signed)
Subjective: Jon Anderson presents today with painful, thick toenails 1-5 b/l that he cannot cut and which interfere with daily activities.  Pain is aggravated when wearing enclosed shoe gear.  Jon Anderson has secondary complaint of painful lesions on the bottom of both feet. Duration is about 2 weeks. He denies any injury to his feet. He has diabetic shoes with custom offloaded insoles, but says his callouses are extremely painful.  Objective: Vascular Examination: Capillary refill time <3 seconds x 10 digits Dorsalis pedis nonpalpable b/l Posterior tibial pulses faintly palpable b/l Digital hair x 10 digits Skin temperature gradient WNL b/l  Dermatological Examination: Skin with normal turgor, texture and tone b/l  Toenails 1-5 b/l discolored, thick, dystrophic with subungual debris and pain with palpation to nailbeds due to thickness of nails.  Painful preulcerative callouses noted b/l submet head 5th and debridement reveals superficial dime-sized ulcerations with left foot more symptomatic than right.There is no erythema, no edema, no purulence. No tracking, no tunneling, no evidence of abscess.  Musculoskeletal: Muscle strength 5/5 to all LE muscle groups  Neurological: Sensation intact with 10 gram monofilament. Vibratory sensation intact.  Bilateral foot xrays reveal no evidence of osteolysis of 5th metatarsal head bones.  Assessment: Painful onychomycosis toenails 1-5 b/l  Superficial diabetic ulcerations b/l submet head 5   Plan: 1. Ulcerations cleansed with wound cleanser. Iodosorb Gel applied to base of wounds with light dressing. 2. Patient to keep both feet dry and change dressings with dispensed supplies of saline and Iodosorb Gel  once daily. 3. Jon Anderson was given the following instructions: KEEP BOTH FEET DRY PERFORM ONCE DAILY: A. CLEANSE BOTH FOOT WOUNDS WITH SALINE B. DRY WITH GAUZE C. APPLY IODOSORB GEL AND BAND-AID TO WOUNDS  D.  PLEASE BRING  BOTH PAIRS OF DIABETIC INSERTS TO YOUR NEXT VISIT!!!! 4. Our Pedorthist, Velora Heckler, evaluated patient today and recommended Jon Anderson bring in his diabetic insoles for modification on next visit. 5. He is to follow up in 10 days. Report to ED with any worsening of wounds, fever, chills, nightsweats, nausea or vomiting. 6. Patient to report any pedal injuries to medical professional immediately. 7. Follow up10 days. 8.  Patient/POA to call should there be a concern in the interim.

## 2018-03-12 ENCOUNTER — Ambulatory Visit (INDEPENDENT_AMBULATORY_CARE_PROVIDER_SITE_OTHER): Payer: Medicare Other | Admitting: Podiatry

## 2018-03-12 ENCOUNTER — Ambulatory Visit: Payer: Medicare Other | Admitting: Orthotics

## 2018-03-12 DIAGNOSIS — E08621 Diabetes mellitus due to underlying condition with foot ulcer: Secondary | ICD-10-CM

## 2018-03-12 DIAGNOSIS — L97521 Non-pressure chronic ulcer of other part of left foot limited to breakdown of skin: Principal | ICD-10-CM

## 2018-03-12 NOTE — Progress Notes (Signed)
Offloaded 2 of this LEFT DBS inserts by adding k-wedges.

## 2018-03-12 NOTE — Patient Instructions (Signed)

## 2018-03-13 ENCOUNTER — Other Ambulatory Visit: Payer: Self-pay | Admitting: Neurosurgery

## 2018-03-13 ENCOUNTER — Encounter: Payer: Self-pay | Admitting: Podiatry

## 2018-03-13 NOTE — Progress Notes (Signed)
Subjective:   Mr. Wendt presents for follow up today  for continued care of ulcerations submetatarsal heads 5 bilateral feet. He states he had a good Thanksgiving.  Feet are not as painful as last week, but left foot still has more discomfort than right.   Denies any odor from wound, no drainage, no nausea, vomiting, fevers or nor chills.    Mr. Weier states has been changing his dressings daily with the Iodosorb Gel.  He did bring in his diabetic inserts on today as he is to see Pedorthist, Velora Heckler, on today for diabetic insert modification.  He is still scheduled to have back surgery on March 14, 2018.   Objective:   Superficial ulcerations located plantar 5th metatarsal head b/l.    Left foot ulceration is 1/2 dime-sized with visible red, granular base surrounded by epithelialization.  tender to palpation. No surrounding erythema, no edema, no drainage. No lymphangitis, no tunneling nor undermining.  No probing to bone, no purulence noted.  Much improved and nearly healed.  Right foot ulceration healed.  He has offloaded inserts in left foot for submet head 5 lesion and right foot for submet heads 1, 5.  Assessment:   Diabetic ulceration submet head 5 left foot, noninfected, nearly healed. Diabetic ulceration submet head 5 right foot,  healed.  Plan: Patient seen and with Dr. Milinda Pointer.  Dr.Hyatt agrees with Dr. Paulla Dolly,  Mr. Arscott really needs 5th metatarsal head resection to resolve this lesion. He has a  plantarflexed metatarsal head with loss of fat pad. He would need to be cleared by Dr. Gwenlyn Found for this.    Mr. Stroebel is still scheduled to have back surgery on December 5th. He has dialysis on MWF.   He will follow up with [place] in 2 weeks.  Left foot debridement of surrounding hyperkeratosis left foot ulcer performed. Ulcer cleansed with wound cleanser. Iodosorb gel and light dressing applied. Supplied sterile gauze, saline to patient. Mr. Saville is to  continue Iodosorb Gel changes daily to both areas b/l. Keep both feet dry.  Diabetic inserts were offloaded today by Pedorthist, Velora Heckler,  in area of 5th metatarsal heads.

## 2018-03-13 NOTE — H&P (Signed)
Chief Complaint   Low back pain  HPI   HPI: Jon Anderson is a 67 y.o. male with recurrent low back pain and right leg pain after undergoing right L5-S1 foraminotomy and microdiskectomy x2 (first procedure performed 07/03/17, second procedure 7/12 due to recurrent HNP at same level) While the right leg pain is constant, he does have left leg pain while ambulating.  He also has mild numbness and tingling in his right lower extremity.  He denies bowel or bladder dysfunction.  He presents today for surgical decompression and fusion.  He is without any concerns.   Patient Active Problem List   Diagnosis Date Noted  . DM neuropathy with neurologic complication ([place]) 40/98/1191  . GERD (gastroesophageal reflux disease) 02/28/2018  . Nephrolithiasis 02/28/2018  . Sciatic leg pain 02/28/2018  . Snores 02/28/2018  . Orthopnea 02/23/2018  . Elevated troponin 02/23/2018  . HNP (herniated nucleus pulposus), lumbar 07/03/2017  . Chest pain in adult 04/27/2017  . Restless leg syndrome, uncontrolled 04/27/2017  . 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 Anemia of chronic disease 03/17/2017  . Severe uncontrolled hypertension 03/17/2017  . ESRD (end stage renal disease) on dialysis ([place]) 03/17/2017  . Hyperlipidemia 03/17/2017  . Chronic chest pain 03/17/2017  . Type II diabetes mellitus ([place]) 03/17/2017  . Acute on chronic diastolic heart failure ([place]) 03/17/2017  . Acute heart failure ([place]) 03/17/2017  . Hemodialysis status ([place]) 01/25/2017  . Lumbar radiculopathy 01/16/2017  . Pre-transplant evaluation for kidney transplant 06/15/2016  . Increased frequency of urination 12/17/2015  . Nocturia 12/17/2015  . Chest pain, non-cardiac 10/09/2015  . Acute on chronic renal failure ([place]) 06/16/2014  . Renal failure (ARF), acute on chronic ([facility]) 06/16/2014  . Shoulder pain, left 12/15/2013  . Chest pain 12/15/2013  . Claudication ([place]) 12/11/2013  . PVD (peripheral vascular disease) ([place])  12/11/2013  . Peripheral arterial disease ([place]) 09/30/2013  . Carotid artery disease ([place]) 09/30/2013  . Acute chest pain 11/15/2012  . Left-sided chest wall pain 03/19/2012  . Bruit 09/15/2010  . CAD (coronary artery disease) nonobstructive per cath 2012   . Precordial chest pain 07/26/2009  . DM (diabetes mellitus) ([place]) 07/22/2009  . Hyperlipidemia LDL goal <70 07/22/2009  . Hypertension 07/22/2009    PMH: Past Medical History:  Diagnosis Date  . Anemia of chronic disease   . CAD (coronary artery disease)    a.  Myoview 4/11: EF 53%, no scar or ischemia   c. MV 2012 Nl perfusion, apical thinning.  No ischemia or scar.  EF 49%, appears greater by visual estimate.;  d.  Dob stress echo 12/13:  Negative Dob stress echo. There is no evidence of ischemia.  The LVF is normal. b. Normal cors 2016.  . Carotid stenosis    a. <47% RICA, >82% LICA by duplex 12/5619  . Chronic chest pain    occ  . ESRD (end stage renal disease) on dialysis ([place])    M-W-F  . GERD (gastroesophageal reflux disease)   . HNP (herniated nucleus pulposus), lumbar   . HTN (hypertension)    echo 3/10: EF 60%, LAE  . Hyperlipidemia   . Nephrolithiasis    "passed them all"  . Peripheral arterial disease ([place])    a. s/p PTCA to L SFA.  Marland Kitchen Pneumonia   . Restless legs   . Sleep apnea    no cpap, needs to reschedule appointment to set up aquiring cpap  . Snores    a. presumed  OSA, pt has refused sleep eval in past.  . Type II diabetes mellitus ([place])    no longer on medications, checks blood glucose at home    PSH: Past Surgical History:  Procedure Laterality Date  . ANGIOPLASTY / STENTING FEMORAL Left 12/11/2013   dr berry  . AV FISTULA PLACEMENT Left 03/19/2014   Procedure: CREATION OF ARTERIOVENOUS (AV) FISTULA  LEFT UPPER ARM;  Surgeon: Mal Misty, MD;  Location: [facility];  Service: Vascular;  Laterality: Left;  . CARDIAC CATHETERIZATION  2001 and 2010   . COLONOSCOPY W/ BIOPSIES AND POLYPECTOMY    .  COLONOSCOPY WITH PROPOFOL N/A 08/01/2016   Procedure: COLONOSCOPY WITH PROPOFOL;  Surgeon: Carol Ada, MD;  Location: [facility];  Service: Endoscopy;  Laterality: N/A;  . CORONARY ARTERY BYPASS GRAFT  2018  . ESOPHAGOGASTRODUODENOSCOPY (EGD) WITH PROPOFOL N/A 08/01/2016   Procedure: ESOPHAGOGASTRODUODENOSCOPY (EGD) WITH PROPOFOL;  Surgeon: Carol Ada, MD;  Location: [facility];  Service: Endoscopy;  Laterality: N/A;  . FOOT FRACTURE SURGERY Right    ligament repair  . FRACTURE SURGERY    . INGUINAL HERNIA REPAIR Left   . LEFT HEART CATH AND CORS/GRAFTS ANGIOGRAPHY N/A 04/27/2017   Procedure: LEFT HEART CATH AND CORS/GRAFTS ANGIOGRAPHY;  Surgeon: Leonie Man, MD;  Location: [facility];  Service: Cardiovascular;  Laterality: N/A;  . LEFT HEART CATHETERIZATION WITH CORONARY ANGIOGRAM N/A 06/22/2014   Procedure: LEFT HEART CATHETERIZATION WITH CORONARY ANGIOGRAM;  Surgeon: Troy Sine, MD;  Location: [facility] CATH LAB;  Service: Cardiovascular;  Laterality: N/A;  . LOWER EXTREMITY ANGIOGRAM Left 12/11/2013   Procedure: LOWER EXTREMITY ANGIOGRAM;  Surgeon: Lorretta Harp, MD;  Location: [facility] CATH LAB;  Service: Cardiovascular;  Laterality: Left;  . LUMBAR LAMINECTOMY/DECOMPRESSION MICRODISCECTOMY Right 07/03/2017   Procedure: MICRODISCECTOMY LUMBAR FIVE - SACRAL ONE RIGHT;  Surgeon: Consuella Lose, MD;  Location: [facility];  Service: Neurosurgery;  Laterality: Right;  . LUMBAR LAMINECTOMY/DECOMPRESSION MICRODISCECTOMY Right 10/19/2017   Procedure: MICRODISCECTOMY LUMBAR FIVE- SACRAL 1 ONE ;  Surgeon: Consuella Lose, MD;  Location: [facility];  Service: Neurosurgery;  Laterality: Right;  . TONSILLECTOMY AND ADENOIDECTOMY    . WISDOM TOOTH EXTRACTION      No medications prior to admission.    SH: Social History   Tobacco Use  . Smoking status: Former Smoker    Packs/day: 1.00    Years: 2.00    Pack years: 2.00    Types: Cigarettes  . Smokeless tobacco: Never Used  . Tobacco  comment: quit smoking 40 yrs ago  Substance Use Topics  . Alcohol use: No    Alcohol/week: 0.0 standard drinks  . Drug use: No    MEDS: Prior to Admission medications   Medication Sig Start Date End Date Taking? Authorizing Provider  acetaminophen (TYLENOL) 500 MG tablet Take 1,000 mg by mouth 2 (two) times daily as needed for moderate pain or headache.    [provider]  aspirin EC 81 MG tablet Take 1 tablet (81 mg total) by mouth daily. 05/28/12   Richardson Dopp T, PA-C  atorvastatin (LIPITOR) 40 MG tablet Take 1 tablet (40 mg total) by mouth daily at 6 PM. 12/13/13   Brett Canales, PA-C  calcium acetate (PHOSLO) 667 MG capsule Take 3 capsules (2,001 mg total) by mouth 3 (three) times daily with meals. Patient taking differently: Take 1,334 mg by mouth 2 (two) times daily with a meal.  10/09/15   Janece Canterbury, MD  clopidogrel (PLAVIX) 75 MG tablet  Take 1 tablet (75 mg total) by mouth daily with breakfast. 07/08/17   Consuella Lose, MD  cyclobenzaprine (FLEXERIL) 5 MG tablet Take 1 tablet (5 mg total) by mouth 3 (three) times daily as needed for muscle spasms. 02/17/18   Drenda Freeze, MD  diclofenac (VOLTAREN) 75 MG EC tablet Take 75 mg by mouth 2 (two) times daily.    [provider]  gabapentin (NEURONTIN) 100 MG capsule Take 100 mg by mouth 2 (two) times daily.    [provider]  multivitamin (RENA-VIT) TABS tablet Take 1 tablet by mouth at bedtime. 06/23/14   Domenic Polite, MD  nitroGLYCERIN (NITROSTAT) 0.4 MG SL tablet Place 1 tablet (0.4 mg total) under the tongue every 5 (five) minutes as needed for chest pain. 03/20/12   Barrett, Evelene Croon, PA-C  pantoprazole (PROTONIX) 40 MG tablet Take 40 mg by mouth daily.    [provider]  polyethylene glycol (MIRALAX / GLYCOLAX) packet Take 17 g by mouth daily as needed for moderate constipation. 03/22/17   Aline August, MD  rOPINIRole (REQUIP) 0.5 MG tablet Take 0.5 mg by mouth 2 (two) times  daily.     [provider]    ALLERGY: Allergies  Allergen Reactions  . Kiwi Extract Swelling and Other (See Comments)    Facial swelling   . Tape Other (See Comments)    Plastic tape-blistering    Social History   Tobacco Use  . Smoking status: Former Smoker    Packs/day: 1.00    Years: 2.00    Pack years: 2.00    Types: Cigarettes  . Smokeless tobacco: Never Used  . Tobacco comment: quit smoking 40 yrs ago  Substance Use Topics  . Alcohol use: No    Alcohol/week: 0.0 standard drinks     Family History  Problem Relation Age of Onset  . Heart attack Sister        died @ 76  . Cancer Mother        died @ 32; unknown type  . Diabetes Brother        deceased  . Cirrhosis Father        alcohol related  . Diabetes Father   . Esophageal cancer Neg Hx   . Colon cancer Neg Hx   . Pancreatic cancer Neg Hx   . Stomach cancer Neg Hx      ROS   ROS  Exam   There were no vitals filed for this visit. General appearance: WDWN, NAD Eyes: No scleral injection Cardiovascular: Regular rate and rhythm without murmurs, rubs, gallops. No edema or variciosities. Distal pulses normal. Pulmonary: Effort normal, non-labored breathing Musculoskeletal:     Muscle tone upper extremities: Normal    Muscle tone lower extremities: Normal    Motor exam: Upper Extremities Deltoid Bicep Tricep Grip  Right 5/5 5/5 5/5 5/5  Left 5/5 5/5 5/5 5/5   Lower Extremity IP Quad PF DF EHL  Right 5/5 5/5 5/5 5/5 5/5  Left 5/5 5/5 5/5 5/5 5/5   Neurological Mental Status:    - Patient is awake, alert, oriented to person, place, month, year, and situation    - Patient is able to give a clear and coherent history.    - No signs of aphasia or neglect Cranial Nerves    - II: Visual Fields are full. PERRL    - III/IV/VI: EOMI without ptosis or diploplia.     - V: Facial sensation is grossly normal    -  VII: Facial movement is symmetric.     - VIII: hearing is intact to voice    - X:  Uvula elevates symmetrically    - XI: Shoulder shrug is symmetric.    - XII: tongue is midline without atrophy or fasciculations.  Sensory: Sensation grossly intact to LT Deep Tendon Reflexes    - 2+ and symmetric in the biceps and patellae.  Plantars   - Toes are downgoing bilaterally.  Cerebellar    - FNF and HKS are intact bilaterally   Results - Imaging/Labs   No results found for this or any previous visit (from the past 48 hour(s)).  No results found.  IMAGING: MRI of the lumbar spine dated 01/13/2018 was reviewed.  Although somewhat difficult to tell, there is either recurrent disc herniation or epidural granulation within the right lateral recess at the level of the L5-S1 disc space or slightly behind the inferior portion of the L5 vertebral body.  Regardless, there remains lateral recess stenosis.  Also seen at L4-5 is a left-sided foraminal/extraforaminal disc protrusion with left-sided foraminal stenosis.  There is also disc degeneration and loss of height with reactive endplate change and trace grade 1 anterolisthesis at this level.   Impression/Plan   67 y.o. male with continued back and now bilateral right greater than left leg pain now nearly 1 year after his 1st microdiskectomy and approximately 3 months after the recurrence diskectomy.  He has previously undergone a course of physical therapy as well as epidural steroid injections which have not provided any significant lasting relief.   He presents today for L4-5 and L5-S1 surgical decompression and fusion.  While in the office the risks which include but are not limited to nerve root injury leading to leg or foot weakness/numbness and/or bowel and bladder dysfunction, CSF leak, bleeding, and infection.  Possible outcomes of surgery were also discussed including the possibility of persistence or worsening of pain symptoms and the possibility of accelerated adjacent level degeneration. The general risks of anesthesia were  also reviewed including heart attack, stroke, and DVT/PE.  He stated language understanding and wished to proceed.

## 2018-03-14 ENCOUNTER — Inpatient Hospital Stay (HOSPITAL_COMMUNITY): Payer: Medicare Other | Admitting: Vascular Surgery

## 2018-03-14 ENCOUNTER — Inpatient Hospital Stay (HOSPITAL_COMMUNITY): Payer: Medicare Other

## 2018-03-14 ENCOUNTER — Inpatient Hospital Stay (HOSPITAL_COMMUNITY)
Admission: RE | Admit: 2018-03-14 | Discharge: 2018-03-18 | DRG: 459 | Disposition: A | Discharge status: Home / Self Care | Payer: Medicare Other | Source: Ambulatory Visit | Attending: Neurosurgery | Admitting: Neurosurgery

## 2018-03-14 ENCOUNTER — Inpatient Hospital Stay (HOSPITAL_COMMUNITY): Payer: Medicare Other | Admitting: Physician Assistant

## 2018-03-14 ENCOUNTER — Encounter (HOSPITAL_COMMUNITY)
Admission: RE | Disposition: A | Discharge status: Home / Self Care | Payer: Self-pay | Source: Ambulatory Visit | Attending: Neurosurgery

## 2018-03-14 ENCOUNTER — Encounter (HOSPITAL_COMMUNITY): Payer: Self-pay

## 2018-03-14 DIAGNOSIS — Z419 Encounter for procedure for purposes other than remedying health state, unspecified: Secondary | ICD-10-CM

## 2018-03-14 DIAGNOSIS — I132 Hypertensive heart and chronic kidney disease with heart failure and with stage 5 chronic kidney disease, or end stage renal disease: Secondary | ICD-10-CM | POA: Diagnosis present

## 2018-03-14 DIAGNOSIS — K219 Gastro-esophageal reflux disease without esophagitis: Secondary | ICD-10-CM | POA: Diagnosis present

## 2018-03-14 DIAGNOSIS — E785 Hyperlipidemia, unspecified: Secondary | ICD-10-CM | POA: Diagnosis present

## 2018-03-14 DIAGNOSIS — I5032 Chronic diastolic (congestive) heart failure: Secondary | ICD-10-CM | POA: Diagnosis present

## 2018-03-14 DIAGNOSIS — I2510 Atherosclerotic heart disease of native coronary artery without angina pectoris: Secondary | ICD-10-CM | POA: Diagnosis present

## 2018-03-14 DIAGNOSIS — D62 Acute posthemorrhagic anemia: Secondary | ICD-10-CM | POA: Diagnosis not present

## 2018-03-14 DIAGNOSIS — Z91018 Allergy to other foods: Secondary | ICD-10-CM

## 2018-03-14 DIAGNOSIS — M4316 Spondylolisthesis, lumbar region: Secondary | ICD-10-CM | POA: Diagnosis present

## 2018-03-14 DIAGNOSIS — N186 End stage renal disease: Secondary | ICD-10-CM | POA: Diagnosis present

## 2018-03-14 DIAGNOSIS — Z951 Presence of aortocoronary bypass graft: Secondary | ICD-10-CM

## 2018-03-14 DIAGNOSIS — Z95820 Peripheral vascular angioplasty status with implants and grafts: Secondary | ICD-10-CM

## 2018-03-14 DIAGNOSIS — I9589 Other hypotension: Secondary | ICD-10-CM | POA: Diagnosis not present

## 2018-03-14 DIAGNOSIS — E1151 Type 2 diabetes mellitus with diabetic peripheral angiopathy without gangrene: Secondary | ICD-10-CM | POA: Diagnosis present

## 2018-03-14 DIAGNOSIS — E1122 Type 2 diabetes mellitus with diabetic chronic kidney disease: Secondary | ICD-10-CM | POA: Diagnosis present

## 2018-03-14 DIAGNOSIS — D638 Anemia in other chronic diseases classified elsewhere: Secondary | ICD-10-CM | POA: Diagnosis present

## 2018-03-14 DIAGNOSIS — Z7902 Long term (current) use of antithrombotics/antiplatelets: Secondary | ICD-10-CM | POA: Diagnosis not present

## 2018-03-14 DIAGNOSIS — G2581 Restless legs syndrome: Secondary | ICD-10-CM | POA: Diagnosis present

## 2018-03-14 DIAGNOSIS — Z87891 Personal history of nicotine dependence: Secondary | ICD-10-CM | POA: Diagnosis not present

## 2018-03-14 DIAGNOSIS — Z9102 Food additives allergy status: Secondary | ICD-10-CM

## 2018-03-14 DIAGNOSIS — M5117 Intervertebral disc disorders with radiculopathy, lumbosacral region: Principal | ICD-10-CM | POA: Diagnosis present

## 2018-03-14 DIAGNOSIS — Z91048 Other nonmedicinal substance allergy status: Secondary | ICD-10-CM

## 2018-03-14 DIAGNOSIS — E1140 Type 2 diabetes mellitus with diabetic neuropathy, unspecified: Secondary | ICD-10-CM | POA: Diagnosis present

## 2018-03-14 DIAGNOSIS — Z7982 Long term (current) use of aspirin: Secondary | ICD-10-CM

## 2018-03-14 DIAGNOSIS — Z992 Dependence on renal dialysis: Secondary | ICD-10-CM | POA: Diagnosis not present

## 2018-03-14 DIAGNOSIS — Z79899 Other long term (current) drug therapy: Secondary | ICD-10-CM

## 2018-03-14 DIAGNOSIS — E8889 Other specified metabolic disorders: Secondary | ICD-10-CM | POA: Diagnosis present

## 2018-03-14 DIAGNOSIS — M5416 Radiculopathy, lumbar region: Secondary | ICD-10-CM | POA: Diagnosis present

## 2018-03-14 LAB — POCT I-STAT 4, (NA,K, GLUC, HGB,HCT)
Glucose, Bld: 77 mg/dL (ref 70–99)
HCT: 37 % — ABNORMAL LOW (ref 39.0–52.0)
Hemoglobin: 12.6 g/dL — ABNORMAL LOW (ref 13.0–17.0)
Potassium: 3.6 mmol/L (ref 3.5–5.1)
Sodium: 134 mmol/L — ABNORMAL LOW (ref 135–145)

## 2018-03-14 LAB — GLUCOSE, CAPILLARY
Glucose-Capillary: 70 mg/dL (ref 70–99)
Glucose-Capillary: 118 mg/dL — ABNORMAL HIGH (ref 70–99)
Glucose-Capillary: 164 mg/dL — ABNORMAL HIGH (ref 70–99)
Glucose-Capillary: 198 mg/dL — ABNORMAL HIGH (ref 70–99)

## 2018-03-14 SURGERY — POSTERIOR LUMBAR FUSION 2 LEVEL
Anesthesia: General

## 2018-03-14 MED ORDER — MIDAZOLAM HCL 2 MG/2ML IJ SOLN
INTRAMUSCULAR | Status: DC | PRN
  Administered 2018-03-14 (×2): 1 mg via INTRAVENOUS

## 2018-03-14 MED ORDER — SODIUM CHLORIDE 0.9 % IV SOLN
INTRAVENOUS | Status: DC | PRN
  Administered 2018-03-14: 08:00:00 via INTRAVENOUS

## 2018-03-14 MED ORDER — GABAPENTIN 300 MG PO CAPS
300.0000 mg | ORAL_CAPSULE | Freq: Three times a day (TID) | ORAL | Status: DC
  Administered 2018-03-14 (×2): 300 mg via ORAL
  Filled 2018-03-14 (×2): qty 1

## 2018-03-14 MED ORDER — PROMETHAZINE HCL 25 MG/ML IJ SOLN: 6.2500 mg | INTRAMUSCULAR | Status: DC | PRN

## 2018-03-14 MED ORDER — ONDANSETRON HCL 4 MG/2ML IJ SOLN: 4.0000 mg | Freq: Four times a day (QID) | INTRAMUSCULAR | Status: DC | PRN

## 2018-03-14 MED ORDER — BISACODYL 10 MG RE SUPP: 10.0000 mg | Freq: Every day | RECTAL | Status: DC | PRN

## 2018-03-14 MED ORDER — PROPOFOL 10 MG/ML IV BOLUS
INTRAVENOUS | Status: DC | PRN
  Administered 2018-03-14: 150 mg via INTRAVENOUS

## 2018-03-14 MED ORDER — SUGAMMADEX SODIUM 200 MG/2ML IV SOLN
INTRAVENOUS | Status: DC | PRN
  Administered 2018-03-14: 150 mg via INTRAVENOUS

## 2018-03-14 MED ORDER — ALBUMIN HUMAN 5 % IV SOLN
INTRAVENOUS | Status: DC | PRN
  Administered 2018-03-14 (×2): via INTRAVENOUS

## 2018-03-14 MED ORDER — DEXAMETHASONE SODIUM PHOSPHATE 10 MG/ML IJ SOLN
INTRAMUSCULAR | Status: DC | PRN
  Administered 2018-03-14: 5 mg via INTRAVENOUS

## 2018-03-14 MED ORDER — SODIUM CHLORIDE 0.9 % IV SOLN: 250.0000 mL | INTRAVENOUS | Status: DC

## 2018-03-14 MED ORDER — ONDANSETRON HCL 4 MG PO TABS: 4.0000 mg | ORAL_TABLET | Freq: Four times a day (QID) | ORAL | Status: DC | PRN

## 2018-03-14 MED ORDER — DEXAMETHASONE SODIUM PHOSPHATE 10 MG/ML IJ SOLN
INTRAMUSCULAR | Status: AC
  Filled 2018-03-14: qty 1

## 2018-03-14 MED ORDER — THROMBIN 5000 UNITS EX SOLR
CUTANEOUS | Status: AC
  Filled 2018-03-14: qty 5000

## 2018-03-14 MED ORDER — SODIUM CHLORIDE 0.9 % IV SOLN
INTRAVENOUS | Status: DC | PRN
  Administered 2018-03-14: 07:00:00 via INTRAVENOUS

## 2018-03-14 MED ORDER — CEFAZOLIN SODIUM-DEXTROSE 2-4 GM/100ML-% IV SOLN
2.0000 g | INTRAVENOUS | Status: AC
  Administered 2018-03-14: 2 g via INTRAVENOUS
  Filled 2018-03-14: qty 100

## 2018-03-14 MED ORDER — LIDOCAINE 2% (20 MG/ML) 5 ML SYRINGE
INTRAMUSCULAR | Status: AC
  Filled 2018-03-14: qty 5

## 2018-03-14 MED ORDER — SODIUM CHLORIDE 0.9 % IV SOLN
INTRAVENOUS | Status: DC | PRN
  Administered 2018-03-14: 12:00:00 via INTRAVENOUS
  Administered 2018-03-14: 20 ug/min via INTRAVENOUS

## 2018-03-14 MED ORDER — PROPOFOL 10 MG/ML IV BOLUS
INTRAVENOUS | Status: AC
  Filled 2018-03-14: qty 20

## 2018-03-14 MED ORDER — PHENYLEPHRINE 40 MCG/ML (10ML) SYRINGE FOR IV PUSH (FOR BLOOD PRESSURE SUPPORT)
PREFILLED_SYRINGE | INTRAVENOUS | Status: DC | PRN
  Administered 2018-03-14: 160 ug via INTRAVENOUS
  Administered 2018-03-14 (×2): 120 ug via INTRAVENOUS

## 2018-03-14 MED ORDER — FENTANYL CITRATE (PF) 100 MCG/2ML IJ SOLN
INTRAMUSCULAR | Status: DC | PRN
  Administered 2018-03-14: 50 ug via INTRAVENOUS
  Administered 2018-03-14: 100 ug via INTRAVENOUS
  Administered 2018-03-14 (×2): 50 ug via INTRAVENOUS

## 2018-03-14 MED ORDER — ACETAMINOPHEN 325 MG PO TABS: 650.0000 mg | ORAL_TABLET | ORAL | Status: DC | PRN

## 2018-03-14 MED ORDER — HYDROMORPHONE HCL 1 MG/ML IJ SOLN
0.2500 mg | INTRAMUSCULAR | Status: DC | PRN
  Administered 2018-03-14 (×2): 0.5 mg via INTRAVENOUS

## 2018-03-14 MED ORDER — HYDROMORPHONE HCL 1 MG/ML IJ SOLN: 0.2500 mg | INTRAMUSCULAR | Status: DC | PRN

## 2018-03-14 MED ORDER — ACETAMINOPHEN 650 MG RE SUPP
650.0000 mg | RECTAL | Status: DC | PRN
  Filled 2018-03-14: qty 1

## 2018-03-14 MED ORDER — CHLORHEXIDINE GLUCONATE CLOTH 2 % EX PADS: 6.0000 | MEDICATED_PAD | Freq: Once | CUTANEOUS | Status: DC

## 2018-03-14 MED ORDER — LIDOCAINE-EPINEPHRINE 1 %-1:100000 IJ SOLN
INTRAMUSCULAR | Status: AC
  Filled 2018-03-14: qty 1

## 2018-03-14 MED ORDER — HYDROMORPHONE HCL 1 MG/ML IJ SOLN
INTRAMUSCULAR | Status: AC
  Filled 2018-03-14: qty 1

## 2018-03-14 MED ORDER — METHOCARBAMOL 500 MG PO TABS
500.0000 mg | ORAL_TABLET | Freq: Four times a day (QID) | ORAL | Status: DC | PRN
  Administered 2018-03-14 – 2018-03-18 (×7): 500 mg via ORAL
  Filled 2018-03-14 (×6): qty 1

## 2018-03-14 MED ORDER — ROCURONIUM BROMIDE 50 MG/5ML IV SOSY
PREFILLED_SYRINGE | INTRAVENOUS | Status: DC | PRN
  Administered 2018-03-14: 70 mg via INTRAVENOUS
  Administered 2018-03-14: 20 mg via INTRAVENOUS
  Administered 2018-03-14: 10 mg via INTRAVENOUS

## 2018-03-14 MED ORDER — METHOCARBAMOL 500 MG PO TABS
ORAL_TABLET | ORAL | Status: AC
  Filled 2018-03-14: qty 1

## 2018-03-14 MED ORDER — MIDAZOLAM HCL 2 MG/2ML IJ SOLN
INTRAMUSCULAR | Status: AC
  Filled 2018-03-14: qty 2

## 2018-03-14 MED ORDER — BUPIVACAINE HCL (PF) 0.5 % IJ SOLN
INTRAMUSCULAR | Status: DC | PRN
  Administered 2018-03-14: 10 mL

## 2018-03-14 MED ORDER — OXYCODONE HCL 5 MG PO TABS
5.0000 mg | ORAL_TABLET | ORAL | Status: DC | PRN
  Administered 2018-03-14 – 2018-03-18 (×10): 10 mg via ORAL
  Filled 2018-03-14 (×9): qty 2

## 2018-03-14 MED ORDER — OXYCODONE HCL 5 MG PO TABS
ORAL_TABLET | ORAL | Status: AC
  Filled 2018-03-14: qty 2

## 2018-03-14 MED ORDER — DIPHENHYDRAMINE HCL 50 MG/ML IJ SOLN
INTRAMUSCULAR | Status: DC | PRN
  Administered 2018-03-14: 6.25 mg via INTRAVENOUS

## 2018-03-14 MED ORDER — HYDROCODONE-ACETAMINOPHEN 5-325 MG PO TABS
1.0000 | ORAL_TABLET | ORAL | Status: DC | PRN
  Administered 2018-03-15 – 2018-03-17 (×7): 1 via ORAL
  Filled 2018-03-14 (×6): qty 1

## 2018-03-14 MED ORDER — FLEET ENEMA 7-19 GM/118ML RE ENEM: 1.0000 | ENEMA | Freq: Once | RECTAL | Status: DC | PRN

## 2018-03-14 MED ORDER — DOCUSATE SODIUM 100 MG PO CAPS
100.0000 mg | ORAL_CAPSULE | Freq: Two times a day (BID) | ORAL | Status: DC
  Administered 2018-03-14 – 2018-03-18 (×8): 100 mg via ORAL
  Filled 2018-03-14 (×8): qty 1

## 2018-03-14 MED ORDER — PHENYLEPHRINE 40 MCG/ML (10ML) SYRINGE FOR IV PUSH (FOR BLOOD PRESSURE SUPPORT)
PREFILLED_SYRINGE | INTRAVENOUS | Status: AC
  Filled 2018-03-14: qty 10

## 2018-03-14 MED ORDER — LIDOCAINE-EPINEPHRINE 1 %-1:100000 IJ SOLN
INTRAMUSCULAR | Status: DC | PRN
  Administered 2018-03-14: 10 mL

## 2018-03-14 MED ORDER — ACETAMINOPHEN 500 MG PO TABS
1000.0000 mg | ORAL_TABLET | Freq: Four times a day (QID) | ORAL | Status: AC
  Administered 2018-03-14 – 2018-03-15 (×4): 1000 mg via ORAL
  Filled 2018-03-14 (×4): qty 2

## 2018-03-14 MED ORDER — ONDANSETRON HCL 4 MG/2ML IJ SOLN: 4.0000 mg | Freq: Once | INTRAMUSCULAR | Status: DC | PRN

## 2018-03-14 MED ORDER — MEPERIDINE HCL 50 MG/ML IJ SOLN: 6.2500 mg | INTRAMUSCULAR | Status: DC | PRN

## 2018-03-14 MED ORDER — SODIUM CHLORIDE 0.9 % IV SOLN
INTRAVENOUS | Status: DC | PRN
  Administered 2018-03-14: 07:00:00

## 2018-03-14 MED ORDER — ROCURONIUM BROMIDE 50 MG/5ML IV SOSY
PREFILLED_SYRINGE | INTRAVENOUS | Status: AC
  Filled 2018-03-14: qty 5

## 2018-03-14 MED ORDER — ZOLPIDEM TARTRATE 5 MG PO TABS: 5.0000 mg | ORAL_TABLET | Freq: Every evening | ORAL | Status: DC | PRN

## 2018-03-14 MED ORDER — CEFAZOLIN SODIUM-DEXTROSE 2-4 GM/100ML-% IV SOLN
2.0000 g | Freq: Once | INTRAVENOUS | Status: AC
  Administered 2018-03-15: 2 g via INTRAVENOUS
  Filled 2018-03-14: qty 100

## 2018-03-14 MED ORDER — FENTANYL CITRATE (PF) 250 MCG/5ML IJ SOLN
INTRAMUSCULAR | Status: AC
  Filled 2018-03-14: qty 5

## 2018-03-14 MED ORDER — MENTHOL 3 MG MT LOZG: 1.0000 | LOZENGE | OROMUCOSAL | Status: DC | PRN

## 2018-03-14 MED ORDER — SODIUM CHLORIDE 0.9 % IV SOLN: INTRAVENOUS | Status: DC

## 2018-03-14 MED ORDER — PHENOL 1.4 % MT LIQD: 1.0000 | OROMUCOSAL | Status: DC | PRN

## 2018-03-14 MED ORDER — BUPIVACAINE HCL (PF) 0.5 % IJ SOLN
INTRAMUSCULAR | Status: AC
  Filled 2018-03-14: qty 30

## 2018-03-14 MED ORDER — THROMBIN 5000 UNITS EX SOLR
OROMUCOSAL | Status: DC | PRN
  Administered 2018-03-14 (×3): via TOPICAL

## 2018-03-14 MED ORDER — SODIUM CHLORIDE 0.9% FLUSH: 3.0000 mL | INTRAVENOUS | Status: DC | PRN

## 2018-03-14 MED ORDER — SODIUM CHLORIDE 0.9% FLUSH
3.0000 mL | Freq: Two times a day (BID) | INTRAVENOUS | Status: DC
  Administered 2018-03-15 – 2018-03-18 (×6): 3 mL via INTRAVENOUS

## 2018-03-14 MED ORDER — ONDANSETRON HCL 4 MG/2ML IJ SOLN
INTRAMUSCULAR | Status: AC
  Filled 2018-03-14: qty 2

## 2018-03-14 MED ORDER — SENNA 8.6 MG PO TABS
1.0000 | ORAL_TABLET | Freq: Two times a day (BID) | ORAL | Status: DC
  Administered 2018-03-14 – 2018-03-18 (×8): 8.6 mg via ORAL
  Filled 2018-03-14 (×8): qty 1

## 2018-03-14 MED ORDER — 0.9 % SODIUM CHLORIDE (POUR BTL) OPTIME
TOPICAL | Status: DC | PRN
  Administered 2018-03-14: 1000 mL

## 2018-03-14 MED ORDER — ONDANSETRON HCL 4 MG/2ML IJ SOLN
INTRAMUSCULAR | Status: DC | PRN
  Administered 2018-03-14: 4 mg via INTRAVENOUS

## 2018-03-14 MED ORDER — HYDROMORPHONE HCL 1 MG/ML IJ SOLN: 0.5000 mg | INTRAMUSCULAR | Status: DC | PRN

## 2018-03-14 MED ORDER — SENNOSIDES-DOCUSATE SODIUM 8.6-50 MG PO TABS: 1.0000 | ORAL_TABLET | Freq: Every evening | ORAL | Status: DC | PRN

## 2018-03-14 MED ORDER — LIDOCAINE 2% (20 MG/ML) 5 ML SYRINGE
INTRAMUSCULAR | Status: DC | PRN
  Administered 2018-03-14: 90 mg via INTRAVENOUS

## 2018-03-14 MED ORDER — METHOCARBAMOL 1000 MG/10ML IJ SOLN
500.0000 mg | Freq: Four times a day (QID) | INTRAVENOUS | Status: DC | PRN
  Filled 2018-03-14: qty 5

## 2018-03-14 SURGICAL SUPPLY — 81 items
ADH SKN CLS APL DERMABOND .7 (GAUZE/BANDAGES/DRESSINGS) ×1
APL SKNCLS STERI-STRIP NONHPOA (GAUZE/BANDAGES/DRESSINGS)
BAG DECANTER FOR FLEXI CONT (MISCELLANEOUS) ×2 IMPLANT
BASKET BONE COLLECTION (BASKET) ×2 IMPLANT
BENZOIN TINCTURE PRP APPL 2/3 (GAUZE/BANDAGES/DRESSINGS) IMPLANT
BIT DRILL 3.5 POWEREASE (BIT) ×2 IMPLANT
BLADE CLIPPER SURG (BLADE) IMPLANT
BLADE SURG 11 STRL SS (BLADE) ×2 IMPLANT
BUR MATCHSTICK NEURO 3.0 LAGG (BURR) ×2 IMPLANT
BUR PRECISION FLUTE 5.0 (BURR) ×2 IMPLANT
CANISTER SUCT 3000ML PPV (MISCELLANEOUS) ×2 IMPLANT
CARTRIDGE OIL MAESTRO DRILL (MISCELLANEOUS) ×1 IMPLANT
CONT SPEC 4OZ CLIKSEAL STRL BL (MISCELLANEOUS) ×2 IMPLANT
COVER BACK TABLE 60X90IN (DRAPES) ×2 IMPLANT
COVER WAND RF STERILE (DRAPES) ×2 IMPLANT
DECANTER SPIKE VIAL GLASS SM (MISCELLANEOUS) ×2 IMPLANT
DERMABOND ADVANCED (GAUZE/BANDAGES/DRESSINGS) ×1
DERMABOND ADVANCED .7 DNX12 (GAUZE/BANDAGES/DRESSINGS) ×1 IMPLANT
DEVICE INTERBODY ELEVATE 23X7 (Cage) ×4 IMPLANT
DEVICE INTERBODY ELEVATE 23X8 (Cage) ×4 IMPLANT
DIFFUSER DRILL AIR PNEUMATIC (MISCELLANEOUS) ×2 IMPLANT
DRAPE C-ARM 42X72 X-RAY (DRAPES) ×2 IMPLANT
DRAPE C-ARMOR (DRAPES) ×2 IMPLANT
DRAPE LAPAROTOMY 100X72X124 (DRAPES) ×2 IMPLANT
DRAPE SURG 17X23 STRL (DRAPES) ×2 IMPLANT
DRSG OPSITE POSTOP 4X8 (GAUZE/BANDAGES/DRESSINGS) ×2 IMPLANT
DURAPREP 26ML APPLICATOR (WOUND CARE) ×2 IMPLANT
ELECT REM PT RETURN 9FT ADLT (ELECTROSURGICAL) ×2
ELECTRODE REM PT RTRN 9FT ADLT (ELECTROSURGICAL) ×1 IMPLANT
GAUZE 4X4 16PLY RFD (DISPOSABLE) IMPLANT
GAUZE SPONGE 4X4 12PLY STRL (GAUZE/BANDAGES/DRESSINGS) IMPLANT
GLOVE BIO SURGEON STRL SZ7.5 (GLOVE) ×1 IMPLANT
GLOVE BIO SURGEON STRL SZ8 (GLOVE) ×2 IMPLANT
GLOVE BIO SURGEON STRL SZ8.5 (GLOVE) ×2 IMPLANT
GLOVE BIOGEL PI IND STRL 7.0 (GLOVE) ×1 IMPLANT
GLOVE BIOGEL PI IND STRL 7.5 (GLOVE) ×5 IMPLANT
GLOVE BIOGEL PI INDICATOR 7.0 (GLOVE) ×1
GLOVE BIOGEL PI INDICATOR 7.5 (GLOVE) ×5
GLOVE ECLIPSE 7.0 STRL STRAW (GLOVE) ×4 IMPLANT
GLOVE EXAM NITRILE XL STR (GLOVE) IMPLANT
GLOVE SS N UNI LF 6.5 STRL (GLOVE) ×6 IMPLANT
GOWN STRL REUS W/ TWL LRG LVL3 (GOWN DISPOSABLE) ×5 IMPLANT
GOWN STRL REUS W/ TWL XL LVL3 (GOWN DISPOSABLE) ×1 IMPLANT
GOWN STRL REUS W/TWL 2XL LVL3 (GOWN DISPOSABLE) IMPLANT
GOWN STRL REUS W/TWL LRG LVL3 (GOWN DISPOSABLE) ×10
GOWN STRL REUS W/TWL XL LVL3 (GOWN DISPOSABLE) ×2
HEMOSTAT POWDER KIT SURGIFOAM (HEMOSTASIS) ×4 IMPLANT
KIT BASIN OR (CUSTOM PROCEDURE TRAY) ×2 IMPLANT
KIT INFUSE X SMALL 1.4CC (Orthopedic Implant) ×2 IMPLANT
KIT POSITION SURG JACKSON T1 (MISCELLANEOUS) ×2 IMPLANT
KIT TURNOVER KIT B (KITS) ×2 IMPLANT
MILL MEDIUM DISP (BLADE) ×2 IMPLANT
NDL HYPO 18GX1.5 BLUNT FILL (NEEDLE) IMPLANT
NEEDLE HYPO 18GX1.5 BLUNT FILL (NEEDLE) IMPLANT
NEEDLE HYPO 22GX1.5 SAFETY (NEEDLE) ×2 IMPLANT
NEEDLE SPNL 18GX3.5 QUINCKE PK (NEEDLE) IMPLANT
NS IRRIG 1000ML POUR BTL (IV SOLUTION) ×2 IMPLANT
OIL CARTRIDGE MAESTRO DRILL (MISCELLANEOUS) ×2
PACK LAMINECTOMY NEURO (CUSTOM PROCEDURE TRAY) ×2 IMPLANT
PAD ARMBOARD 7.5X6 YLW CONV (MISCELLANEOUS) ×6 IMPLANT
PASTE BONE GRAFTON 5CC (Bone Implant) ×2 IMPLANT
PATTIES SURGICAL 1X1 (DISPOSABLE) ×1 IMPLANT
ROD SOLERA 60MM (Rod) ×4 IMPLANT
SCREW 5.5X35MM (Screw) ×12 IMPLANT
SCREW BN 35X5.5XMA NS SPNE (Screw) ×6 IMPLANT
SCREW SET SOLERA (Screw) ×12 IMPLANT
SCREW SET SOLERA TI (Screw) IMPLANT
SPACER SPNL XLORDOTIC 23X8X (Cage) ×2 IMPLANT
SPCR SPNL XLORDOTIC 23X8X (Cage) ×2 IMPLANT
SPONGE LAP 4X18 RFD (DISPOSABLE) ×1 IMPLANT
SPONGE SURGIFOAM ABS GEL 100 (HEMOSTASIS) IMPLANT
STRIP CLOSURE SKIN 1/2X4 (GAUZE/BANDAGES/DRESSINGS) IMPLANT
SUT VIC AB 0 CT1 18XCR BRD8 (SUTURE) ×1 IMPLANT
SUT VIC AB 0 CT1 8-18 (SUTURE) ×2
SUT VIC AB 3-0 FS2 27 (SUTURE) IMPLANT
SUT VICRYL 3-0 RB1 18 ABS (SUTURE) ×2 IMPLANT
SYR 3ML LL SCALE MARK (SYRINGE) IMPLANT
TOWEL GREEN STERILE (TOWEL DISPOSABLE) ×2 IMPLANT
TOWEL GREEN STERILE FF (TOWEL DISPOSABLE) ×2 IMPLANT
TRAY FOLEY MTR SLVR 16FR STAT (SET/KITS/TRAYS/PACK) ×2 IMPLANT
WATER STERILE IRR 1000ML POUR (IV SOLUTION) ×2 IMPLANT

## 2018-03-14 NOTE — Plan of Care (Signed)
  Problem: Education: Goal: Knowledge of General Education information will improve Description Including pain rating scale, medication(s)/side effects and non-pharmacologic comfort measures Outcome: Progressing   

## 2018-03-14 NOTE — Transfer of Care (Signed)
Immediate Anesthesia Transfer of Care Note  Patient: Jon Anderson  Procedure(s) Performed: POSTERIOR LUMBAR INTERBODY FUSION LUMBAR FOUR- LUMBAR FIVE, LUMBAR FIVE- SACRAL ONE, INTERBODY DEVICE,INTERBODY ARTHRODESIS,POSTERIOR SEGMENTAL INSTRUMENTATION (N/A )  Patient Location: PACU  Anesthesia Type:General  Level of Consciousness: awake  Airway & Oxygen Therapy: Patient Spontanous Breathing  Post-op Assessment: Report given to RN and Patient moving all extremities X 4  Post vital signs: Reviewed and stable  Last Vitals:  Vitals Value Taken Time  BP 150/77 03/14/2018  1:30 PM  Temp    Pulse 99 03/14/2018  1:32 PM  Resp 17 03/14/2018  1:32 PM  SpO2 100 % 03/14/2018  1:32 PM  Vitals shown include unvalidated device data.  Last Pain:  Vitals:   03/14/18 0551  PainSc: 0-No pain      Patients Stated Pain Goal: 3 (44/69/50 7225)  Complications: No apparent anesthesia complications

## 2018-03-14 NOTE — Op Note (Signed)
NEUROSURGERY OPERATIVE NOTE   PREOP DIAGNOSIS:  1. Recurrent lumbar disc herniation L5-S1 2. Lumbar spondylolisthesis, L4-5  POSTOP DIAGNOSIS: Same  PROCEDURE: 1. L4, L5 laminectomy with complete facetectomy for decompression of exiting nerve roots, more than would be required for placement of interbody graft 2. Placement of anterior interbody device - Medtronic expandable 3mm lordotic cage x2 @ L4-5, 54mm lordotic cage x2 @ L5-S1 3. Posterior segmental instrumentation using cortical pedicle screws at L4 - S1 - Medtronic Solera 5.5 x 93mm x 6 4. Interbody arthrodesis, L4-5, L5-S1 5. Use of locally harvested bone autograft 6. Use of non-structural bone allograft - Magnifuse, BMP-2  SURGEON: Dr. Consuella Lose, MD  ASSISTANT: Ferne Reus, PA-C  ANESTHESIA: General Endotracheal  EBL: 800cc  SPECIMENS: None  DRAINS: None  COMPLICATIONS: None immediate  CONDITION: Hemodynamically stable to PACU  HISTORY: Jon Anderson is a 67 y.o. male who has been followed in the outpatient neurosurgery clinic initially seen with lumbar disc herniation on the right at L5-S1.  He initially underwent right sided L5-S1 laminotomy and microdiscectomy.  Unfortunately, he had a recurrent disc herniation for which he underwent repeat procedure.  Since that time, he has been complaining of right sided leg pain, and more recently, left-sided leg pain as well.  Repeat MRI demonstrated continued disc herniation on the right at L5-S1, along with broad-based left eccentric disc protrusion at L4-5 with significant left-sided foraminal stenosis.  He also had spondylolisthesis at L4-5.  He did attempt multiple different conservative treatments without significant improvement.  Given his multiply recurrent disc herniations he elected to proceed with surgical decompression and fusion. Risks and benefits were reviewed and consent was obtained.  PROCEDURE IN DETAIL: After informed consent was obtained and  witnessed, the patient was brought to the operating room. After induction of general anesthesia, the patient was positioned on the operative table in the prone position. All pressure points were meticulously padded.  Previous incision was then marked out and prepped and draped in the usual sterile fashion.  After timeout was conducted, skin was infiltrated with local anesthetic.  The previous skin incision was then opened and extended superiorly, and Bovie electrocautery was used to dissect the subcutaneous tissue until the lumbodorsal fascia was identified and incised. The muscle was then elevated in the subperiosteal plane and the L4, L5, and top of S1 lamina and the L4-5 and L5-S1 facet complexes were identified. Self-retaining retractors were then placed.  Previous right-sided L5 laminotomy was also identified.  Position was confirmed with intraoperative fluoroscopy.  At this point attention was turned to decompression. Complete L4 and L5 laminectomy with facetectomy was completed using a combination of Kerrison rongeurs and a high-speed drill.  I was able to identify bilateral L4, L5, and S1 nerve roots.  There did appear to be a significant amount of compression of the left L4 nerve root in the foramen due to disc protrusion as well as hypertrophic facet complex.  On the right side at L5-S1 there was significant disc protrusion compressing the proximal portion of the right S1 nerve root.    Disc space at L4-5 and L5-S1 was then identified, incised, and using a combination of shavers, curettes and rongeurs, complete discectomy was completed at both levels. There was a significant amount of epidural fibrosis on the right side at L5-S1 from his prior microdiscectomies.. Endplates were prepared, and bone harvested during decompression was mixed with bone allograft and BMP and packed into the interspace. A 8 mm lordotic expandable cage was  tapped into place bilaterally at L4-5.  Position was confirmed with  fluoroscopy.  7 mm lordotic expandable cages were placed bilaterally at L5-S1. Good position was confirmed with fluoroscopy.  At this point, the L4, L5, and S1 pedicle screw entry points were identified using standard anatomic landmarks and lateral fluoroscopy.  Pilot holes were then drilled and tapped to 5.5 x 35 mm.  Screws were then placed bilaterally at L4, L5, and S1.  60 mm pre-bent lordotic rod was then placed bilaterally.  Setscrews were placed and final tightened.  Final AP and lateral fluoroscopic images confirmed good position.  Hemostasis was then secured on the muscle edges as well as the epidural surface using a combination of bipolar electrocautery and morselized Gelfoam with thrombin.  The wound was then irrigated with copious amounts of antibiotic saline, then closed in standard fashion using a combination of interrupted 0 and 3-0 Vicryl stitches in the muscular, fascial, and subcutaneous layers. Skin was then closed using standard Dermabond. Sterile dressing was then applied. The patient was then transferred to the stretcher, extubated, and taken to the postanesthesia care unit in stable hemodynamic condition.  At the end of the case all sponge, needle, cottonoid, and instrument counts were correct.

## 2018-03-14 NOTE — Progress Notes (Signed)
Orthopedic Tech Progress Note Patient Details:  Jon Anderson 02/06/1951 [id number]  Patient ID: Jon Anderson, male   DOB: 29-Dec-1950, 67 y.o.   MRN: [id number]   Jon Anderson 03/14/2018, 3:21 PMCalled [employer] for Lumbar brace.

## 2018-03-14 NOTE — Progress Notes (Addendum)
Patient is a diet controlled diabetic.  His blood sugar is 70 this morning which is within normal limits.  His is asymptomatic.  Advised patient to inform me if he begins to feel symptomatic.  He verbablized understanding ISTAT results result a CBG of 77, patient remains asymptomatic at 0615.

## 2018-03-14 NOTE — Anesthesia Postprocedure Evaluation (Signed)
Anesthesia Post Note  Patient: Jon Anderson  Procedure(s) Performed: POSTERIOR LUMBAR INTERBODY FUSION LUMBAR FOUR- LUMBAR FIVE, LUMBAR FIVE- SACRAL ONE, INTERBODY DEVICE,INTERBODY ARTHRODESIS,POSTERIOR SEGMENTAL INSTRUMENTATION (N/A )     Patient location during evaluation: PACU Anesthesia Type: General Level of consciousness: sedated Pain management: pain level controlled Vital Signs Assessment: post-procedure vital signs reviewed and stable Respiratory status: spontaneous breathing and respiratory function stable Cardiovascular status: stable Postop Assessment: no apparent nausea or vomiting Anesthetic complications: no    Last Vitals:  Vitals:   03/14/18 1430 03/14/18 1445  BP: 140/77 (!) 141/78  Pulse: 93 96  Resp: 15 20  Temp:    SpO2: 100% 100%    Last Pain:  Vitals:   03/14/18 1430  PainSc: Asleep                 Rekia Kujala DANIEL

## 2018-03-14 NOTE — Progress Notes (Signed)
PHARMACY NOTE:  ANTIMICROBIAL RENAL DOSAGE ADJUSTMENT  Current antimicrobial regimen includes a mismatch between antimicrobial dosage and estimated renal function.  As per policy approved by the Pharmacy & Therapeutics and Medical Executive Committees, the antimicrobial dosage will be adjusted accordingly.  Current antimicrobial dosage:  Ancef 2gm IV Q8H x 2 doses  Indication: surgical prophylaxis  Renal Function:  Estimated Creatinine Clearance: 12.6 mL/min (A) (by C-G formula based on SCr of 5.81 mg/dL (H)). [x]      On intermittent HD, scheduled: MWF, so likely tomorrow []      On CRRT    Antimicrobial dosage has been changed to:  Ancef 2gm IV x 1 tomorrow at 1230 (~60 min prior to AET and in case patient gets HD)    Monnie Gudgel D. Mina Marble, PharmD, BCPS, [place] 03/14/2018, 4:04 PM

## 2018-03-14 NOTE — Anesthesia Procedure Notes (Signed)
Procedure Name: Intubation Date/Time: 03/14/2018 7:49 AM Performed by: Barrington Ellison, CRNA Pre-anesthesia Checklist: Patient identified, Emergency Drugs available, Suction available and Patient being monitored Patient Re-evaluated:Patient Re-evaluated prior to induction Oxygen Delivery Method: Circle System Utilized Preoxygenation: Pre-oxygenation with 100% oxygen Induction Type: IV induction Ventilation: Mask ventilation without difficulty Laryngoscope Size: Mac and 4 Grade View: Grade I Tube type: Oral Tube size: 7.5 mm Number of attempts: 1 Airway Equipment and Method: Stylet and Oral airway Placement Confirmation: ETT inserted through vocal cords under direct vision,  positive ETCO2 and breath sounds checked- equal and bilateral Secured at: 22 cm Tube secured with: Tape Dental Injury: Teeth and Oropharynx as per pre-operative assessment

## 2018-03-15 LAB — BASIC METABOLIC PANEL
Anion gap: 14 (ref 5–15)
BUN: 28 mg/dL — ABNORMAL HIGH (ref 8–23)
CO2: 27 mmol/L (ref 22–32)
Calcium: 7.5 mg/dL — ABNORMAL LOW (ref 8.9–10.3)
Chloride: 91 mmol/L — ABNORMAL LOW (ref 98–111)
Creatinine, Ser: 9.48 mg/dL — ABNORMAL HIGH (ref 0.61–1.24)
GFR calc Af Amer: 6 mL/min — ABNORMAL LOW (ref >60)
GFR calc non Af Amer: 5 mL/min — ABNORMAL LOW (ref >60)
Glucose, Bld: 103 mg/dL — ABNORMAL HIGH (ref 70–99)
Potassium: 4.9 mmol/L (ref 3.5–5.1)
Sodium: 132 mmol/L — ABNORMAL LOW (ref 135–145)

## 2018-03-15 LAB — PROTIME-INR
INR: 1.17
Prothrombin Time: 14.8 seconds (ref 11.4–15.2)

## 2018-03-15 LAB — CBC
HCT: 27.1 % — ABNORMAL LOW (ref 39.0–52.0)
HCT: 31.1 % — ABNORMAL LOW (ref 39.0–52.0)
Hemoglobin: 8.4 g/dL — ABNORMAL LOW (ref 13.0–17.0)
Hemoglobin: 9.9 g/dL — ABNORMAL LOW (ref 13.0–17.0)
MCH: 30 pg (ref 26.0–34.0)
MCH: 30.2 pg (ref 26.0–34.0)
MCHC: 31 g/dL (ref 30.0–36.0)
MCHC: 31.8 g/dL (ref 30.0–36.0)
MCV: 96.8 fL (ref 80.0–100.0)
MCV: 94.8 fL (ref 80.0–100.0)
Platelets: 168 10*3/uL (ref 150–400)
Platelets: 153 10*3/uL (ref 150–400)
RBC: 2.8 MIL/uL — ABNORMAL LOW (ref 4.22–5.81)
RBC: 3.28 MIL/uL — ABNORMAL LOW (ref 4.22–5.81)
RDW: 13.6 % (ref 11.5–15.5)
RDW: 14 % (ref 11.5–15.5)
WBC: 10.2 10*3/uL (ref 4.0–10.5)
WBC: 10 10*3/uL (ref 4.0–10.5)
nRBC: 0 % (ref 0.0–0.2)
nRBC: 0 % (ref 0.0–0.2)

## 2018-03-15 LAB — GLUCOSE, CAPILLARY
Glucose-Capillary: 101 mg/dL — ABNORMAL HIGH (ref 70–99)
Glucose-Capillary: 109 mg/dL — ABNORMAL HIGH (ref 70–99)

## 2018-03-15 LAB — PREPARE RBC (CROSSMATCH)

## 2018-03-15 LAB — APTT: aPTT: 37 seconds — ABNORMAL HIGH (ref 24–36)

## 2018-03-15 MED ORDER — CHLORHEXIDINE GLUCONATE CLOTH 2 % EX PADS
6.0000 | MEDICATED_PAD | Freq: Every day | CUTANEOUS | Status: DC
  Administered 2018-03-17 – 2018-03-18 (×2): 6 via TOPICAL

## 2018-03-15 MED ORDER — SODIUM CHLORIDE 0.9% IV SOLUTION: Freq: Once | INTRAVENOUS | Status: DC

## 2018-03-15 MED ORDER — PANTOPRAZOLE SODIUM 40 MG PO TBEC
40.0000 mg | DELAYED_RELEASE_TABLET | Freq: Every day | ORAL | Status: DC
  Administered 2018-03-15 – 2018-03-18 (×4): 40 mg via ORAL
  Filled 2018-03-15 (×4): qty 1

## 2018-03-15 MED ORDER — ALBUMIN HUMAN 25 % IV SOLN
25.0000 g | Freq: Once | INTRAVENOUS | Status: AC
  Administered 2018-03-15: 25 g via INTRAVENOUS
  Filled 2018-03-15: qty 100

## 2018-03-15 NOTE — Evaluation (Signed)
Physical Therapy Evaluation Patient Details Name: Jon Anderson MRN: [id number] DOB: [birth year] Today's Date: 03/15/2018   History of Present Illness  Pt is a 67 y/o male who presents s/p L4-S1 PLIF on 03/14/18. PMH significant for DM II, PAD, HTN, ESRD on HD MWF, CAD, prior lumbar surgeries x2.  Clinical Impression  Pt admitted with above diagnosis. Pt currently with functional limitations due to the deficits listed below (see PT Problem List). At the time of PT eval pt was able to perform transfers and ambulation with gross min guard assist for balance support and safety with RW. Pt with significant forward head posture and required frequent cues to avoid bending. VSS after gait training - see Vitals Flowsheet for details. Pt will benefit from skilled PT to increase their independence and safety with mobility to allow discharge to the venue listed below.       Follow Up Recommendations No PT follow up;Supervision for mobility/OOB    Equipment Recommendations  None recommended by PT    Recommendations for Other Services       Precautions / Restrictions Precautions Precautions: Fall;Back Precaution Booklet Issued: Yes (comment) Precaution Comments: Reviewed handout with pt. He was cued for maintenance of precautions during functional mobility.  Required Braces or Orthoses: Spinal Brace Spinal Brace: Lumbar corset;Applied in sitting position Restrictions Weight Bearing Restrictions: No      Mobility  Bed Mobility Overal bed mobility: Needs Assistance Bed Mobility: Rolling;Sidelying to Sit Rolling: Supervision Sidelying to sit: Supervision       General bed mobility comments: Increased time and effort to transition to EOB. VC's for proper log roll technique.   Transfers Overall transfer level: Needs assistance Equipment used: Rolling walker (2 wheeled) Transfers: Sit to/from Stand Sit to Stand: Min guard         General transfer comment: Close guard for safety as  pt powered up to full stand.   Ambulation/Gait Ambulation/Gait assistance: Min guard Gait Distance (Feet): 75 Feet Assistive device: Rolling walker (2 wheeled) Gait Pattern/deviations: Step-through pattern;Decreased stride length;Trunk flexed Gait velocity: Decreased Gait velocity interpretation: <1.8 ft/sec, indicate of risk for recurrent falls General Gait Details: VC's for improved posture and closer walker proximity.   Stairs            Wheelchair Mobility    Modified Rankin (Stroke Patients Only)       Balance Overall balance assessment: Needs assistance Sitting-balance support: Feet supported;No upper extremity supported Sitting balance-Leahy Scale: Fair     Standing balance support: Bilateral upper extremity supported;During functional activity Standing balance-Leahy Scale: Poor Standing balance comment: Reliant on UE support for dynamic standing balance                             Pertinent Vitals/Pain Pain Assessment: Faces Faces Pain Scale: Hurts little more Pain Location: Back, B feet  Pain Descriptors / Indicators: Operative site guarding;Discomfort Pain Intervention(s): Limited activity within patient's tolerance;Monitored during session;Repositioned    Home Living Family/patient expects to be discharged to:: Private residence Living Arrangements: Spouse/significant other Available Help at Discharge: Family;Available 24 hours/day Type of Home: House Home Access: Stairs to enter Entrance Stairs-Rails: None Entrance Stairs-Number of Steps: 2 Home Layout: One level Home Equipment: Walker - 2 wheels;Cane - single point;Bedside commode;Shower seat      Prior Function Level of Independence: Independent with assistive device(s)         Comments: Reports using the RW all of the time prior  to surgery and placing a chair in his shower to sit down on. He was using a regular chair despite having the 3-in-1 BSC     Hand Dominance    Dominant Hand: Right    Extremity/Trunk Assessment   Upper Extremity Assessment Upper Extremity Assessment: Defer to OT evaluation    Lower Extremity Assessment Lower Extremity Assessment: Generalized weakness    Cervical / Trunk Assessment Cervical / Trunk Assessment: Other exceptions Cervical / Trunk Exceptions: s/p surgery; significant forward head posture with rounded shoulders  Communication   Communication: No difficulties  Cognition Arousal/Alertness: Awake/alert Behavior During Therapy: WFL for tasks assessed/performed Overall Cognitive Status: Within Functional Limits for tasks assessed                                        General Comments      Exercises     Assessment/Plan    PT Assessment Patient needs continued PT services  PT Problem List Decreased strength;Decreased activity tolerance;Decreased balance;Decreased mobility;Decreased knowledge of use of DME;Decreased safety awareness;Decreased knowledge of precautions;Pain       PT Treatment Interventions DME instruction;Gait training;Stair training;Functional mobility training;Therapeutic activities;Therapeutic exercise;Neuromuscular re-education;Patient/family education    PT Goals (Current goals can be found in the Care Plan section)  Acute Rehab PT Goals Patient Stated Goal: Home tomorrow PT Goal Formulation: With patient Time For Goal Achievement: 03/22/18 Potential to Achieve Goals: Good    Frequency Min 5X/week   Barriers to discharge        Co-evaluation               AM-PAC PT "6 Clicks" Mobility  Outcome Measure Help needed turning from your back to your side while in a flat bed without using bedrails?: None Help needed moving from lying on your back to sitting on the side of a flat bed without using bedrails?: None Help needed moving to and from a bed to a chair (including a wheelchair)?: A Little Help needed standing up from a chair using your arms (e.g.,  wheelchair or bedside chair)?: A Little Help needed to walk in hospital room?: A Little Help needed climbing 3-5 steps with a railing? : A Little 6 Click Score: 20    End of Session Equipment Utilized During Treatment: Gait belt;Back brace Activity Tolerance: Patient limited by pain;Patient limited by fatigue Patient left: Other (comment)(Sitting EOB with OT present) Nurse Communication: Mobility status PT Visit Diagnosis: Unsteadiness on feet (R26.81);Pain Pain - part of body: (back)    Time: 1355-1419 PT Time Calculation (min) (ACUTE ONLY): 24 min   Charges:   PT Evaluation $PT Eval Moderate Complexity: 1 Mod PT Treatments $Gait Training: 8-22 mins        Rolinda Roan, PT, DPT Acute Rehabilitation Services Pager: [phone] Office: [phone]   Thelma Comp 03/15/2018, 2:52 PM

## 2018-03-15 NOTE — Progress Notes (Signed)
PT Cancellation Note  Patient Details Name: Jon Anderson MRN: [id number] DOB: 1951/02/22   Cancelled Treatment:    Reason Eval/Treat Not Completed: Patient not medically ready. Discussed pt case with RN who states that pt's BP continues to be low and is not appropriate for therapies today. Will hold and attempt to initiate PT eval tomorrow as able.    Thelma Comp 03/15/2018, 10:05 AM   Rolinda Roan, PT, DPT Acute Rehabilitation Services Pager: [phone] Office: [phone]

## 2018-03-15 NOTE — Progress Notes (Signed)
OT Cancellation Note  Patient Details Name: Jon Anderson MRN: [id number] DOB: January 26, 1951   Cancelled Treatment:    Reason Eval/Treat Not Completed: Medical issues which prohibited therapy; spoke with RN, pt currently with low BP (82/48). Will hold for now and follow up for OT eval as schedule permits and as pt is medically able to participate.   Lou Cal, OT Supplemental Rehabilitation Services Pager [phone] Office [phone]   [name] 03/15/2018, 8:09 AM

## 2018-03-15 NOTE — Progress Notes (Addendum)
Received call from nursing regarding low BP. Patient is dialysis patient (MWF). Nephrology consulted by Dr Kathyrn Sheriff. Labs performed this am look stable, although CBC not done. Stat order for CBC.   Came by to evaluate patient shortly after call. He complains of lightheadedness but denies chest pain, dyspnea. He remains hypotensive. Nephrology at bedside. Appreciate assistance.   Blood pressure (!) 84/51, pulse 82, temperature 98.6 F (37 C), temperature source Oral, resp. rate 18, height 5\' 11"  (1.803 m), weight 73.3 kg, SpO2 95 %.  He otherwise complains of appropriate back soreness and bilateral shoulder pain. Was able to walk with therapy yesterday. Dressing with bloody discharge. No significant active bleeding.  MAEW with good strength. Sensation grossly intact to LT. Progressing well from NS perspective.   Will transfer to 4NP/58M. Continue to monitor closely.

## 2018-03-15 NOTE — Evaluation (Signed)
Occupational Therapy Evaluation Patient Details Name: Jon Anderson MRN: [id number] DOB: May 11, 1950 Today's Date: 03/15/2018    History of Present Illness Pt is a 67 y/o male who presents s/p L4-S1 PLIF on 03/14/18. PMH significant for DM II, PAD, HTN, ESRD on HD MWF, CAD, prior lumbar surgeries x2.   Clinical Impression   This 67 y/o male presents with the above. At baseline pt is mod independent with ADLs and functional mobility using RW prior to surgery. Pt currently requiring minA for functional mobility in room using RW, requires minguard-minA for UB ADL, modA for LB ADLs. Pt mostly limited due to pain, generalized weakness this session. Reviewed back precautions, safety and compensatory strategies for performing ADLs and functional transfers while maintaining precautions with pt verbalizing understanding. BP 91/57 end of this session with RN aware. Pt reports he will return home with spouse/daughter who are able to assist with ADLs, iADLs PRN. Pt will benefit from continued acute OT services prior to discharge home to maximize his overall safety and independence with ADLs and mobility. Will follow.     Follow Up Recommendations  No OT follow up;Supervision/Assistance - 24 hour    Equipment Recommendations  None recommended by OT(pt's DME needs are met)           Precautions / Restrictions Precautions Precautions: Fall;Back Precaution Booklet Issued: Yes (comment) Precaution Comments: Reviewed handout with pt. He was cued for maintenance of precautions during functional mobility.  Required Braces or Orthoses: Spinal Brace Spinal Brace: Lumbar corset;Applied in sitting position Restrictions Weight Bearing Restrictions: No      Mobility Bed Mobility Overal bed mobility: Needs Assistance Bed Mobility: Rolling;Sidelying to Sit Rolling: Supervision Sidelying to sit: Supervision       General bed mobility comments: received sitting EOB after end of PT  session  Transfers Overall transfer level: Needs assistance Equipment used: Rolling walker (2 wheeled) Transfers: Sit to/from Stand Sit to Stand: Min assist         General transfer comment: light minA for steadying during rise to standing at RW    Balance Overall balance assessment: Needs assistance Sitting-balance support: Feet supported;No upper extremity supported Sitting balance-Leahy Scale: Fair     Standing balance support: Bilateral upper extremity supported;During functional activity Standing balance-Leahy Scale: Poor Standing balance comment: Reliant on UE support for dynamic standing balance                           ADL either performed or assessed with clinical judgement   ADL Overall ADL's : Needs assistance/impaired Eating/Feeding: Modified independent;Sitting   Grooming: Set up;Sitting   Upper Body Bathing: Min guard;Sitting   Lower Body Bathing: Moderate assistance;Sit to/from stand   Upper Body Dressing : Minimal assistance;Sitting   Lower Body Dressing: Moderate assistance;Sit to/from stand Lower Body Dressing Details (indicate cue type and reason): discussed LB dressing techniques; pt reports he typically utilizes figure 4 technique for LB dressing, did not attempt this session due to pain Toilet Transfer: Minimal assistance;Ambulation;BSC;RW Toilet Transfer Details (indicate cue type and reason): BSC over toilet; simulated with in room mobility around EOB to recliner Toileting- Clothing Manipulation and Hygiene: Minimal assistance;Sit to/from stand       Functional mobility during ADLs: Minimal assistance;Rolling walker General ADL Comments: reviewed back precautions and compensatory strategie for performing ADLs and functional transfes while maintaining precautions     Vision         Perception     Praxis  Pertinent Vitals/Pain Pain Assessment: 0-10 Pain Score: 5  Faces Pain Scale: Hurts little more Pain Location: Back,  B feet  Pain Descriptors / Indicators: Operative site guarding;Discomfort Pain Intervention(s): Limited activity within patient's tolerance;Monitored during session;Repositioned     Hand Dominance Right   Extremity/Trunk Assessment Upper Extremity Assessment Upper Extremity Assessment: Generalized weakness   Lower Extremity Assessment Lower Extremity Assessment: Defer to PT evaluation   Cervical / Trunk Assessment Cervical / Trunk Assessment: Other exceptions Cervical / Trunk Exceptions: s/p surgery; significant forward head posture with rounded shoulders   Communication Communication Communication: No difficulties   Cognition Arousal/Alertness: Awake/alert Behavior During Therapy: WFL for tasks assessed/performed Overall Cognitive Status: Within Functional Limits for tasks assessed                                     General Comments  BP 91/57 end of session seated in recliner, RN aware     Exercises     Shoulder Instructions      Home Living Family/patient expects to be discharged to:: Private residence Living Arrangements: Spouse/significant other Available Help at Discharge: Family;Available 24 hours/day Type of Home: House Home Access: Stairs to enter [employer] of Steps: 2 Entrance Stairs-Rails: None Home Layout: One level     Bathroom Shower/Tub: Teacher, early years/pre: Standard     Home Equipment: Environmental consultant - 2 wheels;Cane - single point;Bedside commode;Shower seat          Prior Functioning/Environment Level of Independence: Independent with assistive device(s)        Comments: Reports using the RW all of the time prior to surgery and placing a chair in his shower to sit down on. He was using a regular chair despite having the 3-in-1 BSC        OT Problem List: Decreased strength;Decreased range of motion;Decreased activity tolerance;Impaired balance (sitting and/or standing);Decreased knowledge of  precautions;Pain      OT Treatment/Interventions: Self-care/ADL training;Therapeutic exercise;DME and/or AE instruction;Therapeutic activities;Patient/family education;Balance training    OT Goals(Current goals can be found in the care plan section) Acute Rehab OT Goals Patient Stated Goal: Home tomorrow OT Goal Formulation: With patient Time For Goal Achievement: 03/29/18 Potential to Achieve Goals: Good  OT Frequency: Min 2X/week   Barriers to D/C:            Co-evaluation              AM-PAC OT "6 Clicks" Daily Activity     Outcome Measure Help from another person eating meals?: None Help from another person taking care of personal grooming?: A Little Help from another person toileting, which includes using toliet, bedpan, or urinal?: A Little Help from another person bathing (including washing, rinsing, drying)?: A Little Help from another person to put on and taking off regular upper body clothing?: A Little Help from another person to put on and taking off regular lower body clothing?: A Lot 6 Click Score: 18   End of Session Equipment Utilized During Treatment: Gait belt;Rolling walker;Back brace Nurse Communication: Mobility status  Activity Tolerance: Patient tolerated treatment well Patient left: in chair;with call bell/phone within reach  OT Visit Diagnosis: Unsteadiness on feet (R26.81);Muscle weakness (generalized) (M62.81);Pain Pain - part of body: Ankle and joints of foot(back)                Time: 4098-1191 OT Time Calculation (min): 20 min Charges:  OT  General Charges $OT Visit: 1 Visit OT Evaluation $OT Eval Low Complexity: 1 Low  Lou Cal, OT Supplemental Rehabilitation Services Pager [phone] Office [place] 03/15/2018, 4:37 PM

## 2018-03-15 NOTE — Progress Notes (Signed)
0739-Patient seemed to be very weak, no complaints of  Chest pains, no sob, no back pains. V/S checked ( see flowsheet) Jon Doffing Pa paged, made aware of low BP, came to see patient.  Ordered to transfer pt to a higher level of care. Nephrology MD came to see patient, schedule for HD today. MD came to see patient made aware of dressing soaked, back dressing changed.

## 2018-03-15 NOTE — Progress Notes (Signed)
Patient transfered to 4N11, report given to Jerral Bonito.

## 2018-03-15 NOTE — Consult Note (Addendum)
Reason for Consult: Continuity of ESRD care Referring Physician: Consuella Lose MD (neurosurgery)  HPI:  67 year old African-American man with past medical history significant for coronary artery disease, hypertension, history of carotid stenosis, dyslipidemia, peripheral vascular disease, obstructive sleep apnea, type 2 diabetes mellitus and ESRD on hemodialysis on a MWF schedule.  He was admitted yesterday and underwent L4/L5 laminectomy with complete facetectomy and placement of anterior interbody device.  He had significant blood loss of 800cc and is noted to be hypotensive this morning.  CBC pending.  He denies any chest pain or shortness of breath and his only complaint today is that of back pain.  He denies any nausea, vomiting or diarrhea.  Hemodialysis prescription: Monday/Wednesday/Friday, [facility], 4 hours, 180 dialyzer, BFR 450/DFR 800, EDW 71.5 kg, 2K/2.0 calcium, heparin 5000 unit bolus Venofer 50 mg weekly, Mircera 30 mcg every 2 weeks Parsabiv 5 mg IV 3 times weekly  Past Medical History:  Diagnosis Date  . Anemia of chronic disease   . CAD (coronary artery disease)    a.  Myoview 4/11: EF 53%, no scar or ischemia   c. MV 2012 Nl perfusion, apical thinning.  No ischemia or scar.  EF 49%, appears greater by visual estimate.;  d.  Dob stress echo 12/13:  Negative Dob stress echo. There is no evidence of ischemia.  The LVF is normal. b. Normal cors 2016.  . Carotid stenosis    a. <02% RICA, >72% LICA by duplex 08/3662  . Chronic chest pain    occ  . ESRD (end stage renal disease) on dialysis ([place])    M-W-F  . GERD (gastroesophageal reflux disease)   . HNP (herniated nucleus pulposus), lumbar   . HTN (hypertension)    echo 3/10: EF 60%, LAE  . Hyperlipidemia   . Nephrolithiasis    "passed them all"  . Peripheral arterial disease ([place])    a. s/p PTCA to L SFA.  Marland Kitchen Pneumonia   . Restless legs   . Sleep apnea    no cpap, needs to reschedule appointment to  set up aquiring cpap  . Snores    a. presumed OSA, pt has refused sleep eval in past.  . Type II diabetes mellitus ([place])    no longer on medications, checks blood glucose at home    Past Surgical History:  Procedure Laterality Date  . ANGIOPLASTY / STENTING FEMORAL Left 12/11/2013   dr berry  . AV FISTULA PLACEMENT Left 03/19/2014   Procedure: CREATION OF ARTERIOVENOUS (AV) FISTULA  LEFT UPPER ARM;  Surgeon: Mal Misty, MD;  Location: [facility];  Service: Vascular;  Laterality: Left;  . CARDIAC CATHETERIZATION  2001 and 2010   . COLONOSCOPY W/ BIOPSIES AND POLYPECTOMY    . COLONOSCOPY WITH PROPOFOL N/A 08/01/2016   Procedure: COLONOSCOPY WITH PROPOFOL;  Surgeon: Carol Ada, MD;  Location: [facility];  Service: Endoscopy;  Laterality: N/A;  . CORONARY ARTERY BYPASS GRAFT  2018  . ESOPHAGOGASTRODUODENOSCOPY (EGD) WITH PROPOFOL N/A 08/01/2016   Procedure: ESOPHAGOGASTRODUODENOSCOPY (EGD) WITH PROPOFOL;  Surgeon: Carol Ada, MD;  Location: [facility];  Service: Endoscopy;  Laterality: N/A;  . FOOT FRACTURE SURGERY Right    ligament repair  . FRACTURE SURGERY    . INGUINAL HERNIA REPAIR Left   . LEFT HEART CATH AND CORS/GRAFTS ANGIOGRAPHY N/A 04/27/2017   Procedure: LEFT HEART CATH AND CORS/GRAFTS ANGIOGRAPHY;  Surgeon: Leonie Man, MD;  Location: [place];  Service: Cardiovascular;  Laterality: N/A;  . LEFT  HEART CATHETERIZATION WITH CORONARY ANGIOGRAM N/A 06/22/2014   Procedure: LEFT HEART CATHETERIZATION WITH CORONARY ANGIOGRAM;  Surgeon: Troy Sine, MD;  Location: [facility] C[facility] LAB;  Service: Cardiovascular;  Laterality: N/A;  . LOWER EXTREMITY ANGIOGRAM Left 12/11/2013   Procedure: LOWER EXTREMITY ANGIOGRAM;  Surgeon: Lorretta Harp, MD;  Location: [facility] CATH LAB;  Service: Cardiovascular;  Laterality: Left;  . LUMBAR LAMINECTOMY/DECOMPRESSION MICRODISCECTOMY Right 07/03/2017   Procedure: MICRODISCECTOMY LUMBAR FIVE - SACRAL ONE RIGHT;  Surgeon: Consuella Lose, MD;   Location: [place];  Service: Neurosurgery;  Laterality: Right;  . LUMBAR LAMINECTOMY/DECOMPRESSION MICRODISCECTOMY Right 10/19/2017   Procedure: MICRODISCECTOMY LUMBAR FIVE- SACRAL 1 ONE ;  Surgeon: Consuella Lose, MD;  Location: [place];  Service: Neurosurgery;  Laterality: Right;  . TONSILLECTOMY AND ADENOIDECTOMY    . WISDOM TOOTH EXTRACTION      Family History  Problem Relation Age of Onset  . Heart attack Sister        died @ 40  . Cancer Mother        died @ 79; unknown type  . Diabetes Brother        deceased  . Cirrhosis Father        alcohol related  . Diabetes Father   . Esophageal cancer Neg Hx   . Colon cancer Neg Hx   . Pancreatic cancer Neg Hx   . Stomach cancer Neg Hx     Social History:  reports that he has quit smoking. His smoking use included cigarettes. He has a 2.00 pack-year smoking history. He has never used smokeless tobacco. He reports that he does not drink alcohol or use drugs.  Allergies:  Allergies  Allergen Reactions  . Kiwi Extract Swelling and Other (See Comments)    Facial swelling   . Tape Other (See Comments)    Plastic tape-blistering    Medications:  Scheduled: . acetaminophen  1,000 mg Oral Q6H  . docusate sodium  100 mg Oral BID  . pantoprazole  40 mg Oral Daily  . senna  1 tablet Oral BID  . sodium chloride flush  3 mL Intravenous Q12H   CBC Latest Ref Rng & Units 03/14/2018 03/05/2018 02/25/2018  WBC 4.0 - 10.5 K/uL - 5.6 7.4  Hemoglobin 13.0 - 17.0 g/dL 12.6(L) 11.8(L) 12.7(L)  Hematocrit 39.0 - 52.0 % 37.0(L) 37.5(L) 40.0  Platelets 150 - 400 K/uL - 224 189   BMP Latest Ref Rng & Units 03/15/2018 03/14/2018 03/05/2018  Glucose 70 - 99 mg/dL 103(H) 77 111(H)  BUN 8 - 23 mg/dL 28(H) - 10  Creatinine 0.61 - 1.24 mg/dL 9.48(H) - 5.81(H)  BUN/Creat Ratio 10 - 24 - - -  Sodium 135 - 145 mmol/L 132(L) 134(L) 134(L)  Potassium 3.5 - 5.1 mmol/L 4.9 3.6 3.4(L)  Chloride 98 - 111 mmol/L 91(L) - 94(L)  CO2 22 - 32 mmol/L 27 - 30   Calcium 8.9 - 10.3 mg/dL 7.5(L) - 8.4(L)    Dg Lumbar Spine 2-3 Views  Result Date: 03/14/2018 CLINICAL DATA:  L4-S1 posterior fusion. EXAM: DG C-ARM 61-120 MIN; LUMBAR SPINE - 2-3 VIEW COMPARISON:  Lumbosacral spine radiograph 02/17/2018 FINDINGS: Post posterior and anterior fusion at L4-S1 with normal alignment of the orthopedic hardware and osseous structures. No evidence of fracture. IMPRESSION: Post L4-S1 PLIF without evidence of immediate complications. Electronically Signed   By: Fidela Salisbury M.D.   On: 03/14/2018 14:41   Dg C-arm 1-60 Min  Result Date: 03/14/2018 CLINICAL DATA:  L4-S1 posterior fusion. EXAM:  DG C-ARM 61-120 MIN; LUMBAR SPINE - 2-3 VIEW COMPARISON:  Lumbosacral spine radiograph 02/17/2018 FINDINGS: Post posterior and anterior fusion at L4-S1 with normal alignment of the orthopedic hardware and osseous structures. No evidence of fracture. IMPRESSION: Post L4-S1 PLIF without evidence of immediate complications. Electronically Signed   By: Fidela Salisbury M.D.   On: 03/14/2018 14:41   Dg C-arm 1-60 Min  Result Date: 03/14/2018 CLINICAL DATA:  L4-S1 posterior fusion. EXAM: DG C-ARM 61-120 MIN; LUMBAR SPINE - 2-3 VIEW COMPARISON:  Lumbosacral spine radiograph 02/17/2018 FINDINGS: Post posterior and anterior fusion at L4-S1 with normal alignment of the orthopedic hardware and osseous structures. No evidence of fracture. IMPRESSION: Post L4-S1 PLIF without evidence of immediate complications. Electronically Signed   By: Fidela Salisbury M.D.   On: 03/14/2018 14:41    Review of Systems  Constitutional: Positive for malaise/fatigue. Negative for chills and fever.  HENT: Negative.   Eyes: Negative.   Respiratory: Negative.   Cardiovascular: Negative.   Gastrointestinal: Negative.   Musculoskeletal: Positive for back pain. Negative for myalgias and neck pain.  Skin: Negative.   Neurological: Negative for sensory change and headaches.   Blood pressure (!) 84/51,  pulse 82, temperature 98.6 F (37 C), temperature source Oral, resp. rate 18, height 5\' 11"  (1.803 m), weight 73.3 kg, SpO2 95 %. Physical Exam  Nursing note and vitals reviewed. Constitutional: He is oriented to person, place, and time. He appears well-developed and well-nourished. No distress.  HENT:  Head: Normocephalic and atraumatic.  Mouth/Throat: Oropharynx is clear and moist. No oropharyngeal exudate.  Eyes: Pupils are equal, round, and reactive to light. Conjunctivae are normal. No scleral icterus.  Neck: Normal range of motion. Neck supple. No JVD present.  Cardiovascular: Normal rate, regular rhythm and normal heart sounds.  No murmur heard. Respiratory: Effort normal and breath sounds normal. He has no wheezes. He has no rales.  GI: Soft. Bowel sounds are normal. There is no tenderness. There is no rebound.  Musculoskeletal: Normal range of motion. He exhibits no edema.  Left BCF-aneurysmal/pulsatile  Neurological: He is alert and oriented to person, place, and time.  Skin: Skin is warm and dry. No rash noted. No pallor.  Psychiatric: He has a normal mood and affect.    Assessment/Plan: 1.  Status post L4/L5 laminectomy with complete facetectomy and placement of anterior interbody device: Management per neurosurgery 2.  End-stage renal disease: I will give intravenous albumin this morning and follow his CBC as he likely needs hemodialysis with blood transfusion to replace volume.  Electrolytes noncritical. 3.  Anemia: Preoperative hemoglobin was excellent at 12.6, await CBC from this morning to decide on severity of ABLA. 4.  Hypotension: Euvolemic on physical exam and likely secondary to significant blood loss intraoperatively yesterday along with effects of anesthesia- will give intravenous albumin this morning and decide on PRBC with HD. 5.  CKD-MBD: Resume phosphorus binders with meals/VDRA with HD. 6.  Nutrition: Resume renal diet  Gregori Abril K. 03/15/2018, 8:35 AM

## 2018-03-16 ENCOUNTER — Other Ambulatory Visit: Payer: Self-pay

## 2018-03-16 LAB — RENAL FUNCTION PANEL
Albumin: 3.2 g/dL — ABNORMAL LOW (ref 3.5–5.0)
Anion gap: 17 — ABNORMAL HIGH (ref 5–15)
BUN: 47 mg/dL — ABNORMAL HIGH (ref 8–23)
CO2: 25 mmol/L (ref 22–32)
Calcium: 7.8 mg/dL — ABNORMAL LOW (ref 8.9–10.3)
Chloride: 89 mmol/L — ABNORMAL LOW (ref 98–111)
Creatinine, Ser: 12.63 mg/dL — ABNORMAL HIGH (ref 0.61–1.24)
GFR calc Af Amer: 4 mL/min — ABNORMAL LOW (ref >60)
GFR calc non Af Amer: 4 mL/min — ABNORMAL LOW (ref >60)
Glucose, Bld: 93 mg/dL (ref 70–99)
Phosphorus: 5.7 mg/dL — ABNORMAL HIGH (ref 2.5–4.6)
Potassium: 4.4 mmol/L (ref 3.5–5.1)
Sodium: 131 mmol/L — ABNORMAL LOW (ref 135–145)

## 2018-03-16 LAB — BPAM RBC
Blood Product Expiration Date: 201912302359
ISSUE DATE / TIME: 201912061655
Unit Type and Rh: 6200

## 2018-03-16 LAB — CBC
HCT: 29.2 % — ABNORMAL LOW (ref 39.0–52.0)
Hemoglobin: 9.4 g/dL — ABNORMAL LOW (ref 13.0–17.0)
MCH: 30.3 pg (ref 26.0–34.0)
MCHC: 32.2 g/dL (ref 30.0–36.0)
MCV: 94.2 fL (ref 80.0–100.0)
Platelets: 162 10*3/uL (ref 150–400)
RBC: 3.1 MIL/uL — ABNORMAL LOW (ref 4.22–5.81)
RDW: 14.3 % (ref 11.5–15.5)
WBC: 8.8 10*3/uL (ref 4.0–10.5)
nRBC: 0 % (ref 0.0–0.2)

## 2018-03-16 LAB — TYPE AND SCREEN
ABO/RH(D): A POS
Antibody Screen: NEGATIVE
Unit division: 0

## 2018-03-16 MED ORDER — SODIUM CHLORIDE 0.9 % IV SOLN: 100.0000 mL | INTRAVENOUS | Status: DC | PRN

## 2018-03-16 MED ORDER — HYDROCODONE-ACETAMINOPHEN 5-325 MG PO TABS
ORAL_TABLET | ORAL | Status: AC
  Filled 2018-03-16: qty 1

## 2018-03-16 MED ORDER — PENTAFLUOROPROP-TETRAFLUOROETH EX AERO: 1.0000 "application " | INHALATION_SPRAY | CUTANEOUS | Status: DC | PRN

## 2018-03-16 MED ORDER — LIDOCAINE-PRILOCAINE 2.5-2.5 % EX CREA: 1.0000 "application " | TOPICAL_CREAM | CUTANEOUS | Status: DC | PRN

## 2018-03-16 NOTE — Progress Notes (Signed)
Patient ID: Jon Anderson, male   DOB: 1950/08/21, 67 y.o.   MRN: [id number]  [facility] Progress Note   Assessment/ Plan:   1.  Status post L4/L5 laminectomy with complete facetectomy and placement of anterior interbody device: Management per neurosurgery ongoing physical therapy and disposition per their recommendations. 2.  End-stage renal disease: He is usually on a Monday/Wednesday/Friday dialysis schedule and I postponed yesterday's dialysis after he developed hypotension and acute blood loss anemia following surgery.  He is now status post PRBC transfusion and intravenous albumin and will undergo hemodialysis today. 3.  Anemia:  With background chronic kidney disease and recent acute blood loss anemia of surgery-PRBC transfusion today. 4.  Hypotension: Resolved with PRBC transfusion/albumin. 5.  CKD-MBD: Resume phosphorus binders with meals/VDRA with HD. 6.  Nutrition: Resume renal diet  Subjective:   Resting comfortably in bed, still having back pain and inquires about going home.   Objective:   BP 116/65   Pulse 79   Temp 98.8 F (37.1 C) (Oral)   Resp (!) 22   Ht 5\' 11"  (1.803 m)   Wt 73.3 kg   SpO2 96%   BMI 22.54 kg/m   Physical Exam: Gen: Appears comfortable resting in bed CVS: Pulse regular rhythm, normal rate, S1 and S2 normal Resp: Clear to auscultation, no rales/rhonchi Abd: Soft, flat, nontender Ext: No lower extremity edema, left upper arm AVF pulsatile  Labs: BMET Recent Labs  Lab 03/14/18 0614 03/15/18 0602  NA 134* 132*  K 3.6 4.9  CL  --  91*  CO2  --  27  GLUCOSE 77 103*  BUN  --  28*  CREATININE  --  9.48*  CALCIUM  --  7.5*   CBC Recent Labs  Lab 03/14/18 0614 03/15/18 0823 03/15/18 2303  WBC  --  10.2 10.0  HGB 12.6* 8.4* 9.9*  HCT 37.0* 27.1* 31.1*  MCV  --  96.8 94.8  PLT  --  168 153   Medications:    . sodium chloride   Intravenous Once  . Chlorhexidine Gluconate Cloth  6 each Topical Q0600  .  docusate sodium  100 mg Oral BID  . pantoprazole  40 mg Oral Daily  . senna  1 tablet Oral BID  . sodium chloride flush  3 mL Intravenous Q12H   Elmarie Shiley, MD 03/16/2018, 11:03 AM

## 2018-03-16 NOTE — Progress Notes (Signed)
PT Progress Note for Charges    03/16/18 1000  PT General Charges  $$ ACUTE PT VISIT 1 Visit  PT Treatments  $Gait Training 8-22 mins  Sherie Don, [place], DPT  Acute Rehabilitation Services Pager [phone] Office [phone]

## 2018-03-16 NOTE — Progress Notes (Signed)
Subjective: Patient reports doing better this morning with pain feels better after his transfusion  Objective: Vital signs in last 24 hours: Temp:  [98 F (36.7 C)-98.6 F (37 C)] 98.3 F (36.8 C) (12/07 0429) Pulse Rate:  [75-94] 85 (12/07 0429) Resp:  [12-17] 13 (12/07 0429) BP: (78-128)/(43-69) 128/63 (12/07 0429) SpO2:  [93 %-100 %] 99 % (12/07 0429)  Intake/Output from previous day: No intake/output data recorded. Intake/Output this shift: No intake/output data recorded.  strength 5 out of 5 wound clean dry and intact  Lab Results: Recent Labs    03/15/18 0823 03/15/18 2303  WBC 10.2 10.0  HGB 8.4* 9.9*  HCT 27.1* 31.1*  PLT 168 153   BMET Recent Labs    03/14/18 0614 03/15/18 0602  NA 134* 132*  K 3.6 4.9  CL  --  91*  CO2  --  27  GLUCOSE 77 103*  BUN  --  28*  CREATININE  --  9.48*  CALCIUM  --  7.5*    Studies/Results: Dg Lumbar Spine 2-3 Views  Result Date: 03/14/2018 CLINICAL DATA:  L4-S1 posterior fusion. EXAM: DG C-ARM 61-120 MIN; LUMBAR SPINE - 2-3 VIEW COMPARISON:  Lumbosacral spine radiograph 02/17/2018 FINDINGS: Post posterior and anterior fusion at L4-S1 with normal alignment of the orthopedic hardware and osseous structures. No evidence of fracture. IMPRESSION: Post L4-S1 PLIF without evidence of immediate complications. Electronically Signed   By: Fidela Salisbury M.D.   On: 03/14/2018 14:41   Dg C-arm 1-60 Min  Result Date: 03/14/2018 CLINICAL DATA:  L4-S1 posterior fusion. EXAM: DG C-ARM 61-120 MIN; LUMBAR SPINE - 2-3 VIEW COMPARISON:  Lumbosacral spine radiograph 02/17/2018 FINDINGS: Post posterior and anterior fusion at L4-S1 with normal alignment of the orthopedic hardware and osseous structures. No evidence of fracture. IMPRESSION: Post L4-S1 PLIF without evidence of immediate complications. Electronically Signed   By: Fidela Salisbury M.D.   On: 03/14/2018 14:41   Dg C-arm 1-60 Min  Result Date: 03/14/2018 CLINICAL DATA:  L4-S1  posterior fusion. EXAM: DG C-ARM 61-120 MIN; LUMBAR SPINE - 2-3 VIEW COMPARISON:  Lumbosacral spine radiograph 02/17/2018 FINDINGS: Post posterior and anterior fusion at L4-S1 with normal alignment of the orthopedic hardware and osseous structures. No evidence of fracture. IMPRESSION: Post L4-S1 PLIF without evidence of immediate complications. Electronically Signed   By: Fidela Salisbury M.D.   On: 03/14/2018 14:41    Assessment/Plan: Continue to mobilize with physical occupational therapy believe he is on the schedule for dialysis today. She can be discharged and stabilize from medical perspective  LOS: 2 days     Jon Anderson 03/16/2018, 7:52 AM

## 2018-03-16 NOTE — Progress Notes (Signed)
Physical Therapy Treatment Patient Details Name: Jon Anderson MRN: [id number] DOB: 06-Mar-1951 Today's Date: 03/16/2018    History of Present Illness Pt is a 67 y/o male who presents s/p L4-S1 PLIF on 03/14/18. PMH significant for DM II, PAD, HTN, ESRD on HD MWF, CAD, prior lumbar surgeries x2.    PT Comments    Pt demonstrates progression towards functional goals. Pt is overall supervision for bed mobility, min A for transfers, and min guard for ambulation. Pt states continued low back pain that limits ambulation tolerance and distance. Throughout the session, an accurate O2 saturation was unable to be obtained (poor waveform), however pt denies SOB throughout the session. Pt was able to recall 3/3 back precautions when asked at beginning of session. Pt would continue to benefit from acute PT in order to improve functional mobility and balance.    BP in sitting at beginning of session- 116/65 mmHg.   Follow Up Recommendations  No PT follow up;Supervision for mobility/OOB     Equipment Recommendations  None recommended by PT    Recommendations for Other Services       Precautions / Restrictions Precautions Precautions: Fall;Back Precaution Booklet Issued: No Precaution Comments: Overview precautions with pt.  Required Braces or Orthoses: Spinal Brace Spinal Brace: Lumbar corset;Applied in sitting position Restrictions Weight Bearing Restrictions: No    Mobility  Bed Mobility Overal bed mobility: Needs Assistance Bed Mobility: Sit to Supine Rolling: Supervision Sidelying to sit: Supervision   Sit to supine: Supervision   General bed mobility comments: would not utilize log roll back to bed. able to bring b les into bed as he laid down, did not break precautions but it seemed to be a difficult maneuver (reports at home he enters/exits on L ) side of bed  Transfers Overall transfer level: Needs assistance Equipment used: Rolling walker (2 wheeled) Transfers: Sit  to/from Stand Sit to Stand: Min guard         General transfer comment: sits/stand x3 during linen change on bed and to get towards hob prior to lying down  Ambulation/Gait Ambulation/Gait assistance: Min guard Gait Distance (Feet): 70 Feet Assistive device: Rolling walker (2 wheeled) Gait Pattern/deviations: Step-through pattern;Decreased stride length Gait velocity: Decreased   General Gait Details: Multimodal cueing for maintaining upright posture.    Stairs             Wheelchair Mobility    Modified Rankin (Stroke Patients Only)       Balance Overall balance assessment: Needs assistance Sitting-balance support: Feet supported;No upper extremity supported Sitting balance-Leahy Scale: Fair     Standing balance support: Bilateral upper extremity supported;During functional activity Standing balance-Leahy Scale: Poor Standing balance comment: Reliant on UE support for dynamic standing balance                            Cognition Arousal/Alertness: Awake/alert Behavior During Therapy: WFL for tasks assessed/performed Overall Cognitive Status: Within Functional Limits for tasks assessed                                        Exercises      General Comments        Pertinent Vitals/Pain Pain Assessment: 0-10 Pain Score: 6  Pain Location: back Pain Descriptors / Indicators: Discomfort;Constant;Operative site guarding Pain Intervention(s): Monitored during session    Home Living  Prior Function            PT Goals (current goals can now be found in the care plan section) Acute Rehab PT Goals Patient Stated Goal: to go home PT Goal Formulation: With patient Time For Goal Achievement: 03/22/18 Potential to Achieve Goals: Good Progress towards PT goals: Progressing toward goals    Frequency    Min 5X/week      PT Plan Current plan remains appropriate    Co-evaluation               AM-PAC PT "6 Clicks" Mobility   Outcome Measure  Help needed turning from your back to your side while in a flat bed without using bedrails?: None Help needed moving from lying on your back to sitting on the side of a flat bed without using bedrails?: None Help needed moving to and from a bed to a chair (including a wheelchair)?: A Little Help needed standing up from a chair using your arms (e.g., wheelchair or bedside chair)?: A Little Help needed to walk in hospital room?: A Little Help needed climbing 3-5 steps with a railing? : A Little 6 Click Score: 20    End of Session Equipment Utilized During Treatment: Gait belt;Back brace Activity Tolerance: Patient limited by pain Patient left: in bed;with nursing/sitter in room;with call bell/phone within reach Nurse Communication: Mobility status PT Visit Diagnosis: Unsteadiness on feet (R26.81);Pain Pain - part of body: (back)     Time: 8343-7357 PT Time Calculation (min) (ACUTE ONLY): 18 min  Charges:  $Gait Training: 8-22 mins                     Wandra Feinstein, SPT Acute Rehab [phone] (pager) [phone] (office)    Plummer Matich 03/16/2018, 10:14 AM

## 2018-03-16 NOTE — Progress Notes (Signed)
Occupational Therapy Treatment Patient Details Name: SAIQUAN HANDS MRN: [id number] DOB: 1950/11/09 Today's Date: 03/16/2018    History of present illness Pt is a 67 y/o male who presents s/p L4-S1 PLIF on 03/14/18. PMH significant for DM II, PAD, HTN, ESRD on HD MWF, CAD, prior lumbar surgeries x2.   OT comments  Pt. Seen for skilled OT session.  Focus on UB/LB dressing and bed mobility.  Pt. Tolerated well and required min guard a.  Reports wife and dtr. Available as needed.  Eager for home.  Will follow along with current POC in preparation for increasing safety and independence with ADLs prior to home.    Follow Up Recommendations  No OT follow up;Supervision/Assistance - 24 hour    Equipment Recommendations  None recommended by OT    Recommendations for Other Services      Precautions / Restrictions Precautions Precautions: Fall;Back Precaution Booklet Issued: No Precaution Comments: Overview precautions with pt.  Required Braces or Orthoses: Spinal Brace Spinal Brace: Lumbar corset;Applied in sitting position Restrictions Weight Bearing Restrictions: No       Mobility Bed Mobility Overal bed mobility: Needs Assistance Bed Mobility: Sit to Supine Rolling: Supervision Sidelying to sit: Supervision   Sit to supine: Supervision   General bed mobility comments: would not utilize log roll back to bed. able to bring b les into bed as he laid down, did not break precautions but it seemed to be a difficult maneuver (reports at home he enters/exits on L ) side of bed  Transfers Overall transfer level: Needs assistance Equipment used: Rolling walker (2 wheeled) Transfers: Sit to/from Stand Sit to Stand: Min guard         General transfer comment: sits/stand x3 during linen change on bed and to get towards hob prior to lying down    Balance Overall balance assessment: Needs assistance Sitting-balance support: Feet supported;No upper extremity supported Sitting  balance-Leahy Scale: Fair     Standing balance support: Bilateral upper extremity supported;During functional activity Standing balance-Leahy Scale: Poor Standing balance comment: Reliant on UE support for dynamic standing balance                           ADL either performed or assessed with clinical judgement   ADL Overall ADL's : Needs assistance/impaired                 Upper Body Dressing : Minimal assistance;Sitting Upper Body Dressing Details (indicate cue type and reason): states he has 24/7 assistance from wife and dtr. as needed so has not had to do the brace by himself. educated on how to don/doff if he needed to do for himself   Lower Body Dressing Details (indicate cue type and reason): utilized figure 4 technique for LB dressing. increased time for completion but able to don/doff both socks           Tub/Shower Transfer Details (indicate cue type and reason): reports he has a tub to step over and uses a 3n1 to sit on during shower Functional mobility during ADLs: Min guard;Rolling walker General ADL Comments: reviewed back precautions and compensatory strategie for performing ADLs and functional transfes while maintaining precautions     Vision       Perception     Praxis      Cognition Arousal/Alertness: Awake/alert Behavior During Therapy: WFL for tasks assessed/performed Overall Cognitive Status: Within Functional Limits for tasks assessed  Exercises     Shoulder Instructions       General Comments      Pertinent Vitals/ Pain       Pain Assessment: 0-10 Pain Score: 6  Pain Location: back Pain Descriptors / Indicators: Discomfort;Constant;Operative site guarding Pain Intervention(s): Monitored during session  Home Living                                          Prior Functioning/Environment              Frequency  Min 2X/week         Progress Toward Goals  OT Goals(current goals can now be found in the care plan section)  Progress towards OT goals: Progressing toward goals  Acute Rehab OT Goals Patient Stated Goal: to go home  Plan      Co-evaluation                 AM-PAC OT "6 Clicks" Daily Activity     Outcome Measure   Help from another person eating meals?: None Help from another person taking care of personal grooming?: A Little Help from another person toileting, which includes using toliet, bedpan, or urinal?: A Little Help from another person bathing (including washing, rinsing, drying)?: A Little Help from another person to put on and taking off regular upper body clothing?: A Little Help from another person to put on and taking off regular lower body clothing?: A Lot 6 Click Score: 18    End of Session Equipment Utilized During Treatment: Back brace;Rolling walker  OT Visit Diagnosis: Unsteadiness on feet (R26.81);Muscle weakness (generalized) (M62.81);Pain Pain - part of body: Ankle and joints of foot   Activity Tolerance Patient tolerated treatment well   Patient Left in bed;with call bell/phone within reach   Nurse Communication          Time: 8850-2774 OT Time Calculation (min): 19 min  Charges: OT General Charges $OT Visit: 1 Visit OT Treatments $Self Care/Home Management : 8-22 mins   Janice Coffin, COTA/L 03/16/2018, 9:44 AM

## 2018-03-16 NOTE — Procedures (Signed)
Patient w/ heparin free trmt d/t surgery 03/14/18.  Hemodialysis system was flushed with 150cc NS q hr.  1530, increase tmp and venous pressure noted, then air detector sensor alarmed.  Large clot burden noted in venous chamber.  Unable to return entire system, approx 100 cc blood loss.  L Penninger, PA-c notified at 1600.  Trmt resumed at 1600 and completed without further incident.

## 2018-03-17 MED ORDER — HYDROCODONE-ACETAMINOPHEN 5-325 MG PO TABS: 1.0000 | ORAL_TABLET | ORAL | 0 refills | Status: DC | PRN

## 2018-03-17 MED ORDER — METHOCARBAMOL 500 MG PO TABS: 500.0000 mg | ORAL_TABLET | Freq: Four times a day (QID) | ORAL | 0 refills | Status: DC | PRN

## 2018-03-17 NOTE — Progress Notes (Signed)
Patient ID: Jon Anderson, male   DOB: [birth year], 67 y.o.   MRN: [id number]  [facility] Progress Note   Assessment/ Plan:   1.  Status post L4/L5 laminectomy with complete facetectomy and placement of anterior interbody device: Management per neurosurgery ongoing physical therapy and plans noted for DC home possibly today. 2.  End-stage renal disease: He is usually on a Monday/Wednesday/Friday dialysis schedule and underwent HD yesterday off schedule. He is stable from renal standpoint to be DC home and go to his scheduled HD as OP tomorrow. 3.  Anemia:  Underlying CKD and recent acute blood loss anemia of surgery-PRBC transfusion 2 days ago. 4.  Hypotension: Resolved with PRBC transfusion/albumin. 5.  CKD-MBD: continue current phosphorus binders with meals/VDRA with HD. 6.  Nutrition: continue renal diet  Subjective:   Reports overall improvement of back pain which worsens with ambulation. Completed dialysis yesterday with an episode of line thrombosis because heparin was held.   Objective:   BP 118/76 (BP Location: Right Arm)   Pulse 94   Temp 97.7 F (36.5 C) (Oral)   Resp (!) 24   Ht 5\' 11"  (1.803 m)   Wt 71.4 kg Comment: bed scale  SpO2 96%   BMI 21.95 kg/m   Physical Exam: Gen: Resting comfortably in bed- watching TV CVS: Pulse regular rhythm, normal rate, S1 and S2 normal Resp: Clear to auscultation, no rales/rhonchi Abd: Soft, flat, nontender Ext: No lower extremity edema, left upper arm AVF pulsatile  Labs: BMET Recent Labs  Lab 03/14/18 0614 03/15/18 0602 03/16/18 1338  NA 134* 132* 131*  K 3.6 4.9 4.4  CL  --  91* 89*  CO2  --  27 25  GLUCOSE 77 103* 93  BUN  --  28* 47*  CREATININE  --  9.48* 12.63*  CALCIUM  --  7.5* 7.8*  PHOS  --   --  5.7*   CBC Recent Labs  Lab 03/14/18 0614 03/15/18 0823 03/15/18 2303 03/16/18 1338  WBC  --  10.2 10.0 8.8  HGB 12.6* 8.4* 9.9* 9.4*  HCT 37.0* 27.1* 31.1* 29.2*  MCV  --  96.8 94.8 94.2   PLT  --  168 153 162   Medications:    . sodium chloride   Intravenous Once  . Chlorhexidine Gluconate Cloth  6 each Topical Q0600  . docusate sodium  100 mg Oral BID  . pantoprazole  40 mg Oral Daily  . senna  1 tablet Oral BID  . sodium chloride flush  3 mL Intravenous Q12H   Elmarie Shiley, MD 03/17/2018, 10:21 AM

## 2018-03-17 NOTE — Progress Notes (Signed)
Subjective: Patient reports some back soreness but feels much better. Completed dialysis yesterday. Reports feeling a little weak when ambulating because of pain.   Objective: Vital signs in last 24 hours: Temp:  [97.9 F (36.6 C)-99.7 F (37.6 C)] 99.7 F (37.6 C) (12/08 0300) Pulse Rate:  [79-104] 104 (12/08 0300) Resp:  [11-22] 17 (12/08 0300) BP: (102-137)/(55-75) 117/68 (12/08 0300) SpO2:  [96 %-100 %] 97 % (12/08 0300) Weight:  [71.4 kg-73.5 kg] 71.4 kg (12/07 1935)  Intake/Output from previous day: 12/07 0701 - 12/08 0700 In: 200 [P.O.:200] Out: 1200  Intake/Output this shift: No intake/output data recorded.  Neurologic: Grossly normal  Lab Results: Lab Results  Component Value Date   WBC 8.8 03/16/2018   HGB 9.4 (L) 03/16/2018   HCT 29.2 (L) 03/16/2018   MCV 94.2 03/16/2018   PLT 162 03/16/2018   Lab Results  Component Value Date   INR 1.17 03/15/2018   BMET Lab Results  Component Value Date   NA 131 (L) 03/16/2018   K 4.4 03/16/2018   CL 89 (L) 03/16/2018   CO2 25 03/16/2018   GLUCOSE 93 03/16/2018   BUN 47 (H) 03/16/2018   CREATININE 12.63 (H) 03/16/2018   CALCIUM 7.8 (L) 03/16/2018    Studies/Results: No results found.  Assessment/Plan: Ok to discharge from nsgy perspective if nephrology approves medically. Continue therapies.    LOS: 3 days    Ocie Cornfield [facility] 03/17/2018, 7:35 AM

## 2018-03-17 NOTE — Plan of Care (Signed)

## 2018-03-17 NOTE — Progress Notes (Signed)
Physical Therapy Treatment Patient Details Name: Jon Anderson MRN: [id number] DOB: 1950/10/26 Today's Date: 03/17/2018    History of Present Illness Pt is a 67 y/o male who presents s/p L4-S1 PLIF on 03/14/18. PMH significant for DM II, PAD, HTN, ESRD on HD MWF, CAD, prior lumbar surgeries x2.    PT Comments    Pt doing well today, but will more radicular pain in his right buttock than on other days.  Pt unable to make it to the stairs today.  Reinforced education.   Follow Up Recommendations  No PT follow up;Supervision for mobility/OOB     Equipment Recommendations  None recommended by PT    Recommendations for Other Services       Precautions / Restrictions Precautions Precautions: Fall;Back Required Braces or Orthoses: Spinal Brace Spinal Brace: Lumbar corset;Applied in sitting position Restrictions Weight Bearing Restrictions: No    Mobility  Bed Mobility Overal bed mobility: Needs Assistance Bed Mobility: Rolling;Sidelying to Sit Rolling: Supervision(with rail) Sidelying to sit: Supervision       General bed mobility comments: reinforced improved technique  Transfers Overall transfer level: Needs assistance Equipment used: Rolling walker (2 wheeled) Transfers: Sit to/from Stand Sit to Stand: Min guard            Ambulation/Gait Ambulation/Gait assistance: Min guard Gait Distance (Feet): 35 Feet(to door and then to bathroom only due to pain) Assistive device: Rolling walker (2 wheeled) Gait Pattern/deviations: Step-through pattern Gait velocity: Decreased Gait velocity interpretation: <1.31 ft/sec, indicative of household ambulator General Gait Details: slow and guarded with pain running down into right buttock.   Stairs             Wheelchair Mobility    Modified Rankin (Stroke Patients Only)       Balance Overall balance assessment: Needs assistance Sitting-balance support: Feet supported;No upper extremity supported Sitting  balance-Leahy Scale: Fair       Standing balance-Leahy Scale: Poor Standing balance comment: Reliant on UE support for dynamic standing balance                            Cognition Arousal/Alertness: Awake/alert Behavior During Therapy: WFL for tasks assessed/performed Overall Cognitive Status: Within Functional Limits for tasks assessed                                        Exercises      General Comments General comments (skin integrity, edema, etc.): Reinforced all back education including progression of activity.      Pertinent Vitals/Pain Pain Assessment: Faces Faces Pain Scale: Hurts even more Pain Location: back Pain Descriptors / Indicators: Discomfort;Constant;Operative site guarding Pain Intervention(s): Monitored during session    Home Living                      Prior Function            PT Goals (current goals can now be found in the care plan section) Acute Rehab PT Goals Patient Stated Goal: to go home PT Goal Formulation: With patient Time For Goal Achievement: 03/22/18 Potential to Achieve Goals: Good Progress towards PT goals: Progressing toward goals    Frequency    Min 5X/week      PT Plan Current plan remains appropriate    Co-evaluation  AM-PAC PT "6 Clicks" Mobility   Outcome Measure  Help needed turning from your back to your side while in a flat bed without using bedrails?: None Help needed moving from lying on your back to sitting on the side of a flat bed without using bedrails?: None Help needed moving to and from a bed to a chair (including a wheelchair)?: A Little Help needed standing up from a chair using your arms (e.g., wheelchair or bedside chair)?: A Little Help needed to walk in hospital room?: A Little Help needed climbing 3-5 steps with a railing? : A Little 6 Click Score: 20    End of Session Equipment Utilized During Treatment: Back brace Activity  Tolerance: Patient limited by pain Patient left: with call bell/phone within reach(on the toilet) Nurse Communication: Mobility status PT Visit Diagnosis: Other abnormalities of gait and mobility (R26.89);Pain Pain - part of body: (back and buttock)     Time: 0867-6195 PT Time Calculation (min) (ACUTE ONLY): 25 min  Charges:  $Gait Training: 8-22 mins $Therapeutic Activity: 8-22 mins                     03/17/2018  Jon Anderson, PT Acute Rehabilitation Services [phone]  (pager) [phone]  (office)   Jon Anderson 03/17/2018, 12:05 PM

## 2018-03-18 LAB — CBC
HCT: 29.5 % — ABNORMAL LOW (ref 39.0–52.0)
Hemoglobin: 9 g/dL — ABNORMAL LOW (ref 13.0–17.0)
MCH: 29.4 pg (ref 26.0–34.0)
MCHC: 30.5 g/dL (ref 30.0–36.0)
MCV: 96.4 fL (ref 80.0–100.0)
Platelets: 223 10*3/uL (ref 150–400)
RBC: 3.06 MIL/uL — ABNORMAL LOW (ref 4.22–5.81)
RDW: 13.4 % (ref 11.5–15.5)
WBC: 7.9 10*3/uL (ref 4.0–10.5)
nRBC: 0 % (ref 0.0–0.2)

## 2018-03-18 LAB — RENAL FUNCTION PANEL
Albumin: 3 g/dL — ABNORMAL LOW (ref 3.5–5.0)
Anion gap: 13 (ref 5–15)
BUN: 12 mg/dL (ref 8–23)
CO2: 26 mmol/L (ref 22–32)
Calcium: 7.5 mg/dL — ABNORMAL LOW (ref 8.9–10.3)
Chloride: 96 mmol/L — ABNORMAL LOW (ref 98–111)
Creatinine, Ser: 4.11 mg/dL — ABNORMAL HIGH (ref 0.61–1.24)
GFR calc Af Amer: 16 mL/min — ABNORMAL LOW (ref >60)
GFR calc non Af Amer: 14 mL/min — ABNORMAL LOW (ref >60)
Glucose, Bld: 114 mg/dL — ABNORMAL HIGH (ref 70–99)
Phosphorus: 2 mg/dL — ABNORMAL LOW (ref 2.5–4.6)
Potassium: 3.1 mmol/L — ABNORMAL LOW (ref 3.5–5.1)
Sodium: 135 mmol/L (ref 135–145)

## 2018-03-18 NOTE — Progress Notes (Addendum)
Physical Therapy Treatment Patient Details Name: Jon Anderson MRN: [id number] DOB: 1950/06/30 Today's Date: 03/18/2018    History of Present Illness Pt is a 67 y/o male who presents s/p L4-S1 PLIF on 03/14/18. PMH significant for DM II, PAD, HTN, ESRD on HD MWF, CAD, prior lumbar surgeries x2.    PT Comments    Pt pleasant in chair on arrival with cues for precautions, sitting position and time limits. Pt walked to hall and stated he couldn't make it to the stairs, rode in recliner to stairs then walked all the way back. Pt with increased gait tolerance and transfers with increased difficulty and pain with transition from sit to stand. Pt educated for all precautions, gait and function. Will continue to follow.     Follow Up Recommendations  Home health PT;Supervision for mobility/OOB     Equipment Recommendations  None recommended by PT    Recommendations for Other Services       Precautions / Restrictions Precautions Precautions: Fall;Back Precaution Booklet Issued: Yes (comment) Precaution Comments: reviewed all precautions with handout provided Required Braces or Orthoses: Spinal Brace Spinal Brace: Lumbar corset;Applied in sitting position Restrictions Weight Bearing Restrictions: No    Mobility  Bed Mobility               General bed mobility comments: in chair on arrival  Transfers Overall transfer level: Needs assistance Equipment used: Rolling walker (2 wheeled) Transfers: Sit to/from Stand Sit to Stand: Min guard         General transfer comment: cues for hand placement  Ambulation/Gait Ambulation/Gait assistance: Min guard Gait Distance (Feet): 110 Feet Assistive device: Rolling walker (2 wheeled) Gait Pattern/deviations: Step-through pattern;Decreased stride length   Gait velocity interpretation: <1.8 ft/sec, indicate of risk for recurrent falls General Gait Details: guarded gait with cues for direction, posture and increased  stride   Stairs Stairs: Yes Stairs assistance: Min assist Stair Management: Step to pattern;One rail Right;Forwards Number of Stairs: 2 General stair comments: pt ascended with HHA and rail forward to ascend with descent used hands on P.T. shoulder to stabilize. Encouraged assist of children for stairs since no rail at home and rental with wife unable to assist   Wheelchair Mobility    Modified Rankin (Stroke Patients Only)       Balance Overall balance assessment: Needs assistance Sitting-balance support: Feet supported;No upper extremity supported Sitting balance-Leahy Scale: Good     Standing balance support: Single extremity supported;No upper extremity supported;During functional activity Standing balance-Leahy Scale: Fair Standing balance comment:                             Cognition Arousal/Alertness: Awake/alert Behavior During Therapy: WFL for tasks assessed/performed Overall Cognitive Status: Within Functional Limits for tasks assessed                                        Exercises      General Comments General comments (skin integrity, edema, etc.): continued reinforcement of precautions and brace mgmt       Pertinent Vitals/Pain Pain Assessment: Faces Pain Score: 6  Faces Pain Scale: Hurts even more Pain Location: back Pain Descriptors / Indicators: Discomfort;Constant;Operative site guarding;Grimacing Pain Intervention(s): Limited activity within patient's tolerance    Home Living  Prior Function            PT Goals (current goals can now be found in the care plan section) Acute Rehab PT Goals Patient Stated Goal: to go home Progress towards PT goals: Progressing toward goals    Frequency           PT Plan Discharge plan needs to be updated    Co-evaluation              AM-PAC PT "6 Clicks" Mobility   Outcome Measure  Help needed turning from your back to your  side while in a flat bed without using bedrails?: A Little Help needed moving from lying on your back to sitting on the side of a flat bed without using bedrails?: A Little Help needed moving to and from a bed to a chair (including a wheelchair)?: A Little Help needed standing up from a chair using your arms (e.g., wheelchair or bedside chair)?: A Little Help needed to walk in hospital room?: A Little Help needed climbing 3-5 steps with a railing? : A Little 6 Click Score: 18    End of Session Equipment Utilized During Treatment: Back brace Activity Tolerance: Patient tolerated treatment well Patient left: in chair;with call bell/phone within reach Nurse Communication: Mobility status PT Visit Diagnosis: Other abnormalities of gait and mobility (R26.89)     Time: 2536-6440 PT Time Calculation (min) (ACUTE ONLY): 24 min  Charges:  $Gait Training: 23-37 mins                     [place] Pager: [phone] Office: [place] 03/18/2018, 11:11 AM

## 2018-03-18 NOTE — Care Management Important Message (Signed)
Important Message  Patient Details  Name: Jon Anderson MRN: [id number] Date of Birth: 15-Oct-1950   Medicare Important Message Given:  Yes    [name] 03/18/2018, 4:29 PM

## 2018-03-18 NOTE — Progress Notes (Addendum)
  [facility] Progress Note   Subjective: Seen in room. Back pain improving. No CP/dyspnea. Will be discharged today s/p HD.   Objective Vitals:   03/17/18 1946 03/17/18 2307 03/18/18 0300 03/18/18 0900  BP: 131/71 (!) 147/71 (!) 141/78 (!) 151/80  Pulse: 86 88 91 (!) 101  Resp: 19 13 15 16   Temp: 98.6 F (37 C) 98.8 F (37.1 C) 98.8 F (37.1 C) 98.7 F (37.1 C)  TempSrc: Oral Oral Oral Oral  SpO2: 97% 98% 100% 100%  Weight:      Height:       Physical Exam General: Well appearing man, NAD Heart: RRR; no murmur Lungs: CTAB, Back brace in place Extremities: No LE edema Dialysis Access:  AVF + thrill  Additional Objective Labs: Basic Metabolic Panel: Recent Labs  Lab 03/14/18 0614 03/15/18 0602 03/16/18 1338  NA 134* 132* 131*  K 3.6 4.9 4.4  CL  --  91* 89*  CO2  --  27 25  GLUCOSE 77 103* 93  BUN  --  28* 47*  CREATININE  --  9.48* 12.63*  CALCIUM  --  7.5* 7.8*  PHOS  --   --  5.7*   Liver Function Tests: Recent Labs  Lab 03/16/18 1338  ALBUMIN 3.2*   CBC: Recent Labs  Lab 03/15/18 0823 03/15/18 2303 03/16/18 1338  WBC 10.2 10.0 8.8  HGB 8.4* 9.9* 9.4*  HCT 27.1* 31.1* 29.2*  MCV 96.8 94.8 94.2  PLT 168 153 162   Medications: . sodium chloride Stopped (03/14/18 1315)  . sodium chloride Stopped (03/14/18 1315)  . methocarbamol (ROBAXIN) IV     . sodium chloride   Intravenous Once  . Chlorhexidine Gluconate Cloth  6 each Topical Q0600  . docusate sodium  100 mg Oral BID  . pantoprazole  40 mg Oral Daily  . senna  1 tablet Oral BID  . sodium chloride flush  3 mL Intravenous Q12H   Dialysis Orders: MWF  [facility]  4h  450/800  71.5kg  2/2 bath  Hep 5000  AVF  V - 50 weekly  M -30 every 2 wks  Parsabiv 5 mg tiw IV   Assessment/Plan: 1. Status post L4/L5 laminectomy with complete facetectomy and placement of anterior interbody device: Management per neurosurgery ongoing physical therapy and plans noted for DC home possibly  today. 2. End-stage renal disease: HD per MWF schedule. Pt prefers to have HD here prior to discharge. 3. Anemia: Underlying CKD and recent acute blood loss anemia of surgery-PRBC transfusion 2 days ago. 4. Hypotension: Resolved with PRBC transfusion/albumin. 5. CKD-MBD: continue current phosphorus binders with meals/VDRAwith HD. 6.Nutrition: continue renal diet  Veneta Penton, PA-C 03/18/2018, 11:07 AM  [facility] Pager: [phone]  Pt seen, examined and agree w A/P as above.  Kelly Splinter MD [employer] pager [phone]   03/18/2018, 11:32 AM

## 2018-03-18 NOTE — Discharge Summary (Signed)
Physician Discharge Summary  Patient ID: Jon Anderson MRN: [id number] DOB/AGE: 1950-11-24 67 y.o.  Admit date: 03/14/2018 Discharge date: 03/18/2018  Admission Diagnoses:  Lumbar radiculopathy  Discharge Diagnoses:  Same Active Problems:   Lumbar radiculopathy   Discharged Condition: Stable  Hospital Course:  Jon Anderson is a 67 y.o. male who was admitted for the below procedure. Post op complicated by hypotension secondary to acute blood loss anemia. Nephrology consulted (ESRD on dialysis MWF) and given two units PRBC and albumin. Hypotension resolved with aforementioned. At time of discharge, pain was well controlled, ambulating with Pt/OT, tolerating po, voiding normal. Ready for discharge.  Treatments: Surgery L4-5 and L5-S1 PLIF  Discharge Exam: Blood pressure (!) 141/78, pulse 91, temperature 98.8 F (37.1 C), temperature source Oral, resp. rate 15, height 5\' 11"  (1.803 m), weight 71.4 kg, SpO2 100 %. Awake, alert, oriented Speech fluent, appropriate CN grossly intact 5/5 BUE/BLE Wound c/d/i  Disposition: Discharge disposition: 01-Home or Self Care       Discharge Instructions    Call MD for:  difficulty breathing, headache or visual disturbances   Complete by:  As directed    Call MD for:  persistant dizziness or light-headedness   Complete by:  As directed    Call MD for:  redness, tenderness, or signs of infection (pain, swelling, redness, odor or green/yellow discharge around incision site)   Complete by:  As directed    Call MD for:  severe uncontrolled pain   Complete by:  As directed    Call MD for:  temperature >100.4   Complete by:  As directed    Diet general   Complete by:  As directed    Driving Restrictions   Complete by:  As directed    Do not drive until given clearance.   Increase activity slowly   Complete by:  As directed    Lifting restrictions   Complete by:  As directed    Do not lift anything >10lbs. Avoid bending  and twisting in awkward positions. Avoid bending at the back.   May shower / Bathe   Complete by:  As directed    In 24 hours. Okay to wash wound with warm soapy water. Avoid scrubbing the wound. Pat dry.   Remove dressing in 24 hours   Complete by:  As directed      Allergies as of 03/18/2018      Reactions   Kiwi Extract Swelling, Other (See Comments)   Facial swelling    Tape Other (See Comments)   Plastic tape-blistering      Medication List    STOP taking these medications   clopidogrel 75 MG tablet Commonly known as:  PLAVIX     TAKE these medications   acetaminophen 500 MG tablet Commonly known as:  TYLENOL Take 1,000 mg by mouth 2 (two) times daily as needed for moderate pain or headache.   aspirin EC 81 MG tablet Take 1 tablet (81 mg total) by mouth daily.   atorvastatin 40 MG tablet Commonly known as:  LIPITOR Take 1 tablet (40 mg total) by mouth daily at 6 PM.   calcium acetate 667 MG capsule Commonly known as:  PHOSLO Take 3 capsules (2,001 mg total) by mouth 3 (three) times daily with meals. What changed:    how much to take  when to take this   cyclobenzaprine 5 MG tablet Commonly known as:  FLEXERIL Take 1 tablet (5 mg total) by mouth 3 (three) times  daily as needed for muscle spasms.   diclofenac 75 MG EC tablet Commonly known as:  VOLTAREN Take 75 mg by mouth 2 (two) times daily.   gabapentin 100 MG capsule Commonly known as:  NEURONTIN Take 100 mg by mouth 2 (two) times daily.   HYDROcodone-acetaminophen 5-325 MG tablet Commonly known as:  NORCO/VICODIN Take 1 tablet by mouth every 4 (four) hours as needed for moderate pain ((score 4 to 6)).   methocarbamol 500 MG tablet Commonly known as:  ROBAXIN Take 1 tablet (500 mg total) by mouth every 6 (six) hours as needed for muscle spasms.   multivitamin Tabs tablet Take 1 tablet by mouth at bedtime.   nitroGLYCERIN 0.4 MG SL tablet Commonly known as:  NITROSTAT Place 1 tablet (0.4 mg  total) under the tongue every 5 (five) minutes as needed for chest pain.   pantoprazole 40 MG tablet Commonly known as:  PROTONIX Take 40 mg by mouth daily.   polyethylene glycol packet Commonly known as:  MIRALAX / GLYCOLAX Take 17 g by mouth daily as needed for moderate constipation.   rOPINIRole 0.5 MG tablet Commonly known as:  REQUIP Take 0.5 mg by mouth 2 (two) times daily.        SignedAlyson Ingles 03/18/2018, 7:36 AM

## 2018-03-18 NOTE — Discharge Instructions (Signed)
Can remove transparent dressing and shower 48 hours after surgery Walk as much as possible No heavy lifting >10 lbs No excessive bending/twisting at the waist  

## 2018-03-18 NOTE — Progress Notes (Signed)
  NEUROSURGERY PROGRESS NOTE   No issues overnight.  Complains of appropriate back soreness Pre op pain much improved  EXAM:  BP (!) 141/78 (BP Location: Right Arm)   Pulse 91   Temp 98.8 F (37.1 C) (Oral)   Resp 15   Ht 5\' 11"  (1.803 m)   Wt 71.4 kg Comment: bed scale  SpO2 100%   BMI 21.95 kg/m   Awake, alert, oriented  Speech fluent, appropriate  CN grossly intact  5/5 BUE/BLE  Wound with small amount of dried blood on bandage. No active drainage  PLAN Doing well this am D/C after dialysis

## 2018-03-18 NOTE — Plan of Care (Signed)

## 2018-03-18 NOTE — Progress Notes (Signed)
Occupational Therapy Treatment Patient Details Name: Jon Anderson MRN: [id number] DOB: November 25, 1950 Today's Date: 03/18/2018    History of present illness Pt is a 67 y/o male who presents s/p L4-S1 PLIF on 03/14/18. PMH significant for DM II, PAD, HTN, ESRD on HD MWF, CAD, prior lumbar surgeries x2.   OT comments  Patient seated on commode upon entry, and willing to participate in OT.  Patient reports sitting on commode for prolonged time, and he is not wearing back brace.  Reviewed brace mgmt and wear schedule, educated on importance to wear if planning prolonged sitting time (even on commode) for back precautions and pain mgmt.  Continues to require min guard for transfers and standing tasks at sink.  Able to recall precautions with independence, but min cueing to adhere to during self care tasks.  Patient plans to dc home today, and reports he will have 24/7 support.  Will continue to follow.    Follow Up Recommendations  No OT follow up;Supervision/Assistance - 24 hour    Equipment Recommendations  None recommended by OT    Recommendations for Other Services      Precautions / Restrictions Precautions Precautions: Fall;Back Precaution Comments: reviewed precautions with patient, able to recall 3/3 without cueing but requires min cueing to adhere to during functional tasks  Required Braces or Orthoses: Spinal Brace Spinal Brace: Lumbar corset;Applied in sitting position Restrictions Weight Bearing Restrictions: No       Mobility Bed Mobility               General bed mobility comments: seated on commode upon entry   Transfers Overall transfer level: Needs assistance Equipment used: Rolling walker (2 wheeled) Transfers: Sit to/from Stand Sit to Stand: Min guard         General transfer comment: min guard for safety, cueing for hand placement and body mechanics    Balance Overall balance assessment: Needs assistance Sitting-balance support: Feet supported;No  upper extremity supported Sitting balance-Leahy Scale: Fair     Standing balance support: Single extremity supported;No upper extremity supported;During functional activity Standing balance-Leahy Scale: Poor Standing balance comment: grooming with min guard standing 0-1 hand support, but preference to UE support                           ADL either performed or assessed with clinical judgement   ADL Overall ADL's : Needs assistance/impaired     Grooming: Min guard;Set up;Oral care;Wash/dry hands;Sitting;Standing Grooming Details (indicate cue type and reason): initally standing with min guard, completing seated with setup assist                  Toilet Transfer: Min guard;Ambulation;Grab bars;RW;Comfort height toilet Toilet Transfer Details (indicate cue type and reason): min guard for safety, cueing for hand placement and body mechanics         Functional mobility during ADLs: Min guard;Rolling walker General ADL Comments: reviewed back precautions, brace mgmt and safety.  pt seated on commode (reports for 30 min) without brace, educated on use of no brace for quick trips but prolonged sitting or mobility needs to have brace on      Vision       Perception     Praxis      Cognition Arousal/Alertness: Awake/alert Behavior During Therapy: WFL for tasks assessed/performed Overall Cognitive Status: Within Functional Limits for tasks assessed  Exercises     Shoulder Instructions       General Comments continued reinforcement of precautions and brace mgmt     Pertinent Vitals/ Pain       Pain Assessment: Faces Faces Pain Scale: Hurts even more Pain Location: back Pain Descriptors / Indicators: Discomfort;Constant;Operative site guarding;Grimacing Pain Intervention(s): Monitored during session;Repositioned(applied brace)  Home Living                                           Prior Functioning/Environment              Frequency  Min 2X/week        Progress Toward Goals  OT Goals(current goals can now be found in the care plan section)  Progress towards OT goals: Progressing toward goals  Acute Rehab OT Goals Patient Stated Goal: to go home OT Goal Formulation: With patient Time For Goal Achievement: 03/29/18 Potential to Achieve Goals: Good  Plan Discharge plan remains appropriate;Frequency remains appropriate    Co-evaluation                 AM-PAC OT "6 Clicks" Daily Activity     Outcome Measure   Help from another person eating meals?: None Help from another person taking care of personal grooming?: A Little Help from another person toileting, which includes using toliet, bedpan, or urinal?: A Little Help from another person bathing (including washing, rinsing, drying)?: A Little Help from another person to put on and taking off regular upper body clothing?: A Little Help from another person to put on and taking off regular lower body clothing?: A Lot 6 Click Score: 18    End of Session Equipment Utilized During Treatment: Back brace;Rolling walker  OT Visit Diagnosis: Unsteadiness on feet (R26.81);Muscle weakness (generalized) (M62.81);Pain Pain - part of body: (back)   Activity Tolerance Patient tolerated treatment well   Patient Left in chair;with call bell/phone within reach   Nurse Communication          Time: 1552-0802 OT Time Calculation (min): 21 min  Charges: OT General Charges $OT Visit: 1 Visit OT Treatments $Self Care/Home Management : 8-22 mins  Delight Stare, [place] Pager [phone] Office [phone]    Delight Stare 03/18/2018, 9:06 AM

## 2018-03-25 ENCOUNTER — Ambulatory Visit (HOSPITAL_COMMUNITY)
Admission: RE | Admit: 2018-03-25 | Discharge: 2018-03-25 | Disposition: A | Discharge status: Home / Self Care | Payer: Medicare Other | Source: Ambulatory Visit | Attending: Neurosurgery | Admitting: Neurosurgery

## 2018-03-25 ENCOUNTER — Other Ambulatory Visit (HOSPITAL_COMMUNITY): Payer: Self-pay | Admitting: Physician Assistant

## 2018-03-25 DIAGNOSIS — M7989 Other specified soft tissue disorders: Secondary | ICD-10-CM | POA: Insufficient documentation

## 2018-03-25 DIAGNOSIS — M79605 Pain in left leg: Secondary | ICD-10-CM

## 2018-03-25 NOTE — Progress Notes (Signed)
Left lower extremity venous duplex completed - There is no evidence of a DVT. Full preliminary results found in chart review CV Proc. [place] Jennesis Ramaswamy,RVS 03/25/2018 3:31 PM

## 2018-03-26 ENCOUNTER — Ambulatory Visit (INDEPENDENT_AMBULATORY_CARE_PROVIDER_SITE_OTHER): Payer: Medicare Other | Admitting: Podiatry

## 2018-03-26 ENCOUNTER — Encounter: Payer: Self-pay | Admitting: Podiatry

## 2018-03-26 ENCOUNTER — Other Ambulatory Visit: Payer: Self-pay | Admitting: Adult Health

## 2018-03-26 DIAGNOSIS — E1151 Type 2 diabetes mellitus with diabetic peripheral angiopathy without gangrene: Secondary | ICD-10-CM

## 2018-03-26 DIAGNOSIS — L97521 Non-pressure chronic ulcer of other part of left foot limited to breakdown of skin: Secondary | ICD-10-CM | POA: Diagnosis not present

## 2018-03-26 DIAGNOSIS — I739 Peripheral vascular disease, unspecified: Secondary | ICD-10-CM

## 2018-03-26 DIAGNOSIS — E08621 Diabetes mellitus due to underlying condition with foot ulcer: Secondary | ICD-10-CM | POA: Diagnosis not present

## 2018-03-26 DIAGNOSIS — L89620 Pressure ulcer of left heel, unstageable: Secondary | ICD-10-CM | POA: Diagnosis not present

## 2018-03-26 DIAGNOSIS — R6889 Other general symptoms and signs: Secondary | ICD-10-CM

## 2018-03-26 NOTE — Progress Notes (Signed)
Subjective: [name] presents to clinic with his daughter on today. He did have his back surgery on Dec 5th and is recovering from that.   He does have swelling left foot and had venous doppler performed which was negative.  He notes new problem of heel pain left lateral heel which is tender to palpation. This new onset occurred since he's been at home recuperating from surgery. He is in bed mostly and on occasion, in a recliner.  He relates his feet feel much better with offloading provided by Pedorthist Jon Anderson on last visit as well as decreased weightbearing due to back surgery.  Objective:    Vascular Examination: Capillary refill time <3 seconds x 10 digits Dorsalis pedis nonpalpable b/l Posterior tibial pulses faintly palpable b/l Digital hair x 10 digits Skin temperature gradient WNL b/l  Dermatological Examination: Left foot ulceration is healed and epithelialized with callus formation. No erythema, no edema, no drainage, no flocculence.    Right foot ulceration remains healed with callus formation.  No erythema, no edema, no drainage, no flocculence.    Left posterolateral heel with tenderness to palpation. No erythema, no edema, no drainage, no blistering/flocculence.  Left foot reveals webspaces intact with no open wounds. +Edema.  Musculoskeletal: He has offloaded inserts in left foot for submet head 5 lesion and right foot for submet heads 1, 5.  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 Diabetic ulceration submet head 5 right foot,  Healed. New problem of early pressure to left heel  Plan: Callus gently filed b/l feet.  Discussed pressure ulcers and it appears he has pain due to lying on his back and staying in that position. Recommended heel pillows to remove pressure from heels. We offered them in the office, but [name] daughter [place] them on [place] and will order them. In the meantime, I educated her on floating his  heels whenever he is in bed/in the recliner. She related understanding.  Continue diabetic shoes with offloaded insoles daily.  He has been evaluated by surgeons,  Drs.Jon Anderson and Jon Anderson. [name] really needs 5th metatarsal head resection to resolve these lesions. 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 He would need to be cleared by Dr. Gwenlyn Anderson for this.  He has vascular [facility] scheduled for December 31st.  He will follow up with [place] in 3 weeks and knows to call/come in should he have any problems before then.

## 2018-03-26 NOTE — Patient Instructions (Signed)
Diabetes and Foot Care Diabetes may cause you to have problems because of poor blood supply (circulation) to your feet and legs. This may cause the skin on your feet to become thinner, break easier, and heal more slowly. Your skin may become dry, and the skin may peel and crack. You may also have nerve damage in your legs and feet causing decreased feeling in them. You may not notice minor injuries to your feet that could lead to infections or more serious problems. Taking care of your feet is one of the most important things you can do for yourself. Follow these instructions at home:  Wear shoes at all times, even in the house. Do not go barefoot. Bare feet are easily injured.  Check your feet daily for blisters, cuts, and redness. If you cannot see the bottom of your feet, use a mirror or ask someone for help.  Wash your feet with warm water (do not use hot water) and mild soap. Then pat your feet and the areas between your toes until they are completely dry. Do not soak your feet as this can dry your skin.  Apply a moisturizing lotion or petroleum jelly (that does not contain alcohol and is unscented) to the skin on your feet and to dry, brittle toenails. Do not apply lotion between your toes.  Trim your toenails straight across. Do not dig under them or around the cuticle. File the edges of your nails with an emery board or nail file.  Do not cut corns or calluses or try to remove them with medicine.  Wear clean socks or stockings every day. Make sure they are not too tight. Do not wear knee-high stockings since they may decrease blood flow to your legs.  Wear shoes that fit properly and have enough cushioning. To break in new shoes, wear them for just a few hours a day. This prevents you from injuring your feet. Always look in your shoes before you put them on to be sure there are no objects inside.  Do not cross your legs. This may decrease the blood flow to your feet.  If you find a  minor scrape, cut, or break in the skin on your feet, keep it and the skin around it clean and dry. These areas may be cleansed with mild soap and water. Do not cleanse the area with peroxide, alcohol, or iodine.  When you remove an adhesive bandage, be sure not to damage the skin around it.  If you have a wound, look at it several times a day to make sure it is healing.  Do not use heating pads or hot water bottles. They may burn your skin. If you have lost feeling in your feet or legs, you may not know it is happening until it is too late.  Make sure your health care provider performs a complete foot exam at least annually or more often if you have foot problems. Report any cuts, sores, or bruises to your health care provider immediately. Contact a health care provider if:  You have an injury that is not healing.  You have cuts or breaks in the skin.  You have an ingrown nail.  You notice redness on your legs or feet.  You feel burning or tingling in your legs or feet.  You have pain or cramps in your legs and feet.  Your legs or feet are numb.  Your feet always feel cold. Get help right away if:  There is increasing   redness, swelling, or pain in or around a wound.  There is a red line that goes up your leg.  Pus is coming from a wound.  You develop a fever or as directed by your health care provider.  You notice a bad smell coming from an ulcer or wound. This information is not intended to replace advice given to you by your health care provider. Make sure you discuss any questions you have with your health care provider. Document Released: 03/24/2000 Document Revised: 09/02/2015 Document Reviewed: 09/03/2012 Elsevier Interactive Patient Education  2017 [employer]  Diabetic Neuropathy Diabetic neuropathy is a nerve disease or nerve damage that is caused by diabetes mellitus. About half of all people with diabetes mellitus have some form of nerve damage. Nerve damage  is more common in those who have had diabetes mellitus for many years and who generally have not had good control of their blood sugar (glucose) level. Diabetic neuropathy is a common complication of diabetes mellitus. There are three common types of diabetic neuropathy and a fourth type that is less common and less understood:  Peripheral neuropathy-This is the most common type of diabetic neuropathy. It causes damage to the nerves of the feet and legs first and then eventually the hands and arms. The damage affects the ability to sense touch.  Autonomic neuropathy-This type causes damage to the autonomic nervous system, which controls the following functions: ? Heartbeat. ? Body temperature. ? Blood pressure. ? Urination. ? Digestion. ? Sweating. ? Sexual function.  Focal neuropathy-Focal neuropathy can be painful and unpredictable and occurs most often in older adults with diabetes mellitus. It involves a specific nerve or one area and often comes on suddenly. It usually does not cause long-term problems.  Radiculoplexus neuropathy- Sometimes called lumbosacral radiculoplexus neuropathy, radiculoplexus neuropathy affects the nerves of the thighs, hips, buttocks, or legs. It is more common in people with type 2 diabetes mellitus and in older men. It is characterized by debilitating pain, weakness, and atrophy, usually in the thigh muscles.  What are the causes? The cause of peripheral, autonomic, and focal neuropathies is diabetes mellitus that is uncontrolled and high glucose levels. The cause of radiculoplexus neuropathy is unknown. However, it is thought to be caused by inflammation related to uncontrolled glucose levels. What are the signs or symptoms? Peripheral Neuropathy Peripheral neuropathy develops slowly over time. When the nerves of the feet and legs no longer work there may be:  Burning, stabbing, or aching pain in the legs or feet.  Inability to feel pressure or pain in your  feet. This can lead to: ? Thick calluses over pressure areas. ? Pressure sores. ? Ulcers.  Foot deformities.  Reduced ability to feel temperature changes.  Muscle weakness.  Autonomic Neuropathy The symptoms of autonomic neuropathy vary depending on which nerves are affected. Symptoms may include:  Problems with digestion, such as: ? Feeling sick to your stomach (nausea). ? Vomiting. ? Bloating. ? Constipation. ? Diarrhea. ? Abdominal pain.  Difficulty with urination. This occurs if you lose your ability to sense when your bladder is full. Problems include: ? Urine leakage (incontinence). ? Inability to empty your bladder completely (retention).  Rapid or irregular heartbeat (palpitations).  Blood pressure drops when you stand up (orthostatic hypotension). When you stand up you may feel: ? Dizzy. ? Weak. ? Faint.  In men, inability to attain and maintain an erection.  In women, vaginal dryness and problems with decreased sexual desire and arousal.  Problems with body temperature  regulation.  Increased or decreased sweating.  Focal Neuropathy  Abnormal eye movements or abnormal alignment of both eyes.  Weakness in the wrist.  Foot drop. This results in an inability to lift the foot properly and abnormal walking or foot movement.  Paralysis on one side of your face (Bell palsy).  Chest or abdominal pain. Radiculoplexus Neuropathy  Sudden, severe pain in your hip, thigh, or buttocks.  Weakness and wasting of thigh muscles.  Difficulty rising from a seated position.  Abdominal swelling.  Unexplained weight loss (usually more than 10 lb [4.5 kg]). How is this diagnosed? Peripheral Neuropathy Your senses may be tested. Sensory function testing can be done with:  A light touch using a monofilament.  A vibration with tuning fork.  A sharp sensation with a pin prick.  Other tests that can help diagnose neuropathy are:  Nerve conduction velocity. This  test checks the transmission of an electrical current through a nerve.  Electromyography. This shows how muscles respond to electrical signals transmitted by nearby nerves.  Quantitative sensory testing. This is used to assess how your nerves respond to vibrations and changes in temperature.  Autonomic Neuropathy Diagnosis is often based on reported symptoms. Tell your health care provider if you experience:  Dizziness.  Constipation.  Diarrhea.  Inappropriate urination or inability to urinate.  Inability to get or maintain an erection.  Tests that may be done include:  Electrocardiography or Holter monitor. These are tests that can help show problems with the heart rate or heart rhythm.  An X-ray exam may be done.  Focal Neuropathy Diagnosis is made based on your symptoms and what your health care provider finds during your exam. Other tests may be done. They may include:  Nerve conduction velocities. This checks the transmission of electrical current through a nerve.  Electromyography. This shows how muscles respond to electrical signals transmitted by nearby nerves.  Quantitative sensory testing. This test is used to assess how your nerves respond to vibration and changes in temperature.  Radiculoplexus Neuropathy  Often the first thing is to eliminate any other issue or problems that might be the cause, as there is no standard test for diagnosis.  X-ray exam of your spine and lumbar region.  Spinal tap to rule out cancer.  MRI to rule out other lesions. How is this treated? Once nerve damage occurs, it cannot be reversed. The goal of treatment is to keep the disease or nerve damage from getting worse and affecting more nerve fibers. Controlling your blood glucose level is the key. Most people with radiculoplexus neuropathy see at least a partial improvement over time. You will need to keep your blood glucose and HbA1c levels in the target range determined by your  health care provider. Things that help control blood glucose levels include:  Blood glucose monitoring.  Meal planning.  Physical activity.  Diabetes medicine.  Over time, maintaining lower blood glucose levels helps lessen symptoms. Sometimes, prescription pain medicine is needed. Follow these instructions at home:  Do not smoke.  Keep your blood glucose level in the range that you and your health care provider have determined acceptable for you.  Keep your blood pressure level in the range that you and your health care provider have determined acceptable for you.  Eat a well-balanced diet.  Be physically active every day. Include strength training and balance exercises.  Protect your feet. ? Check your feet every day for sores, cuts, blisters, or signs of infection. ? Wear padded socks  and supportive shoes. Use orthotic inserts, if necessary. ? Regularly check the insides of your shoes for worn spots. Make sure there are no rocks or other items inside your shoes before you put them on. Contact a health care provider if:  You have burning, stabbing, or aching pain in the legs or feet.  You are unable to feel pressure or pain in your feet.  You develop problems with digestion such as: ? Nausea. ? Vomiting. ? Bloating. ? Constipation. ? Diarrhea. ? Abdominal pain.  You have difficulty with urination, such as: ? Incontinence. ? Retention.  You have palpitations.  You develop orthostatic hypotension. When you stand up you may feel: ? Dizzy. ? Weak. ? Faint.  You cannot attain and maintain an erection (in men).  You have vaginal dryness and problems with decreased sexual desire and arousal (in women).  You have severe pain in your thighs, legs, or buttocks.  You have unexplained weight loss. This information is not intended to replace advice given to you by your health care provider. Make sure you discuss any questions you have with your health care  provider. Document Released: 06/05/2001 Document Revised: 09/02/2015 Document Reviewed: 09/05/2012 Elsevier Interactive Patient Education  2017 [employer].

## 2018-04-09 ENCOUNTER — Ambulatory Visit (HOSPITAL_COMMUNITY)
Admission: RE | Admit: 2018-04-09 | Payer: Medicare Other | Source: Ambulatory Visit | Attending: Adult Health | Admitting: Adult Health

## 2018-04-09 ENCOUNTER — Ambulatory Visit (HOSPITAL_COMMUNITY): Payer: Medicare Other

## 2018-04-11 ENCOUNTER — Ambulatory Visit (HOSPITAL_COMMUNITY)
Admission: RE | Admit: 2018-04-11 | Discharge: 2018-04-11 | Disposition: A | Discharge status: Home / Self Care | Payer: Medicare Other | Source: Ambulatory Visit | Attending: Cardiology | Admitting: Cardiology

## 2018-04-11 ENCOUNTER — Ambulatory Visit (HOSPITAL_BASED_OUTPATIENT_CLINIC_OR_DEPARTMENT_OTHER)
Admission: RE | Admit: 2018-04-11 | Discharge: 2018-04-11 | Disposition: A | Discharge status: Home / Self Care | Payer: Medicare Other | Source: Ambulatory Visit | Attending: Adult Health | Admitting: Adult Health

## 2018-04-11 DIAGNOSIS — R6889 Other general symptoms and signs: Secondary | ICD-10-CM | POA: Diagnosis present

## 2018-04-11 DIAGNOSIS — I739 Peripheral vascular disease, unspecified: Secondary | ICD-10-CM | POA: Diagnosis present

## 2018-04-12 IMAGING — US US CAROTID DUPLEX BILAT
1 series · 13 of 24 positions shown · non-contrast
Comparison: 07/12/2015

CLINICAL DATA: Carotid artery disease

EXAM:
BILATERAL CAROTID DUPLEX ULTRASOUND
TECHNIQUE: Gray scale imaging, color Doppler and duplex ultrasound were
performed of bilateral carotid and vertebral arteries in the neck.

[Series 1: us carotid duplex bilat · 0.06mm/px · 13 of 87 slices shown]
[im 1/87]
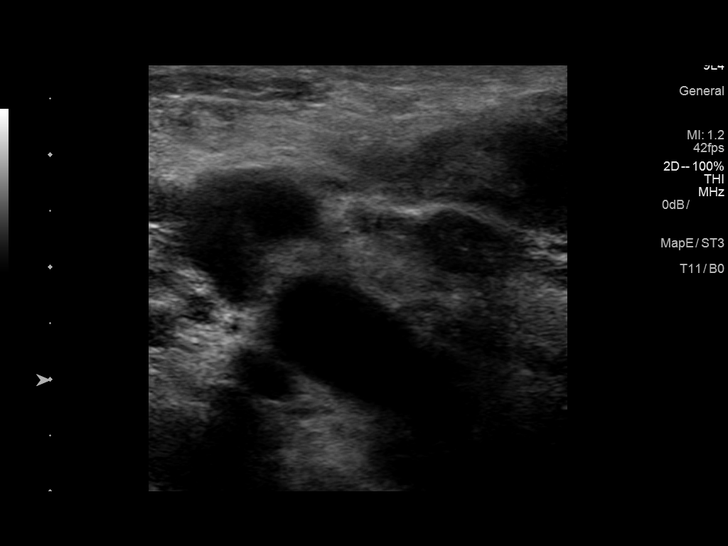
[im 8/87]
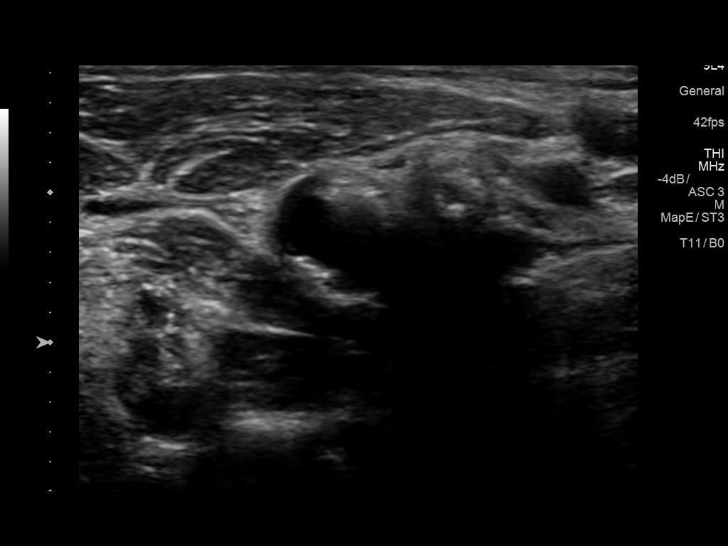
[im 15/87]
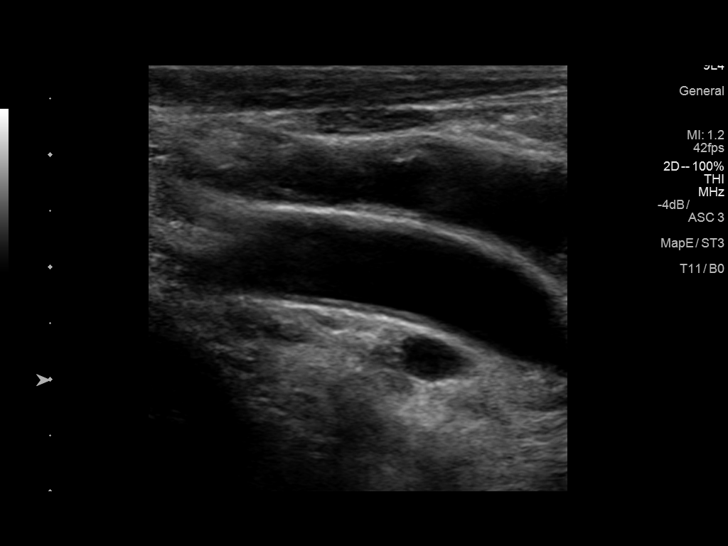
[im 23/87]
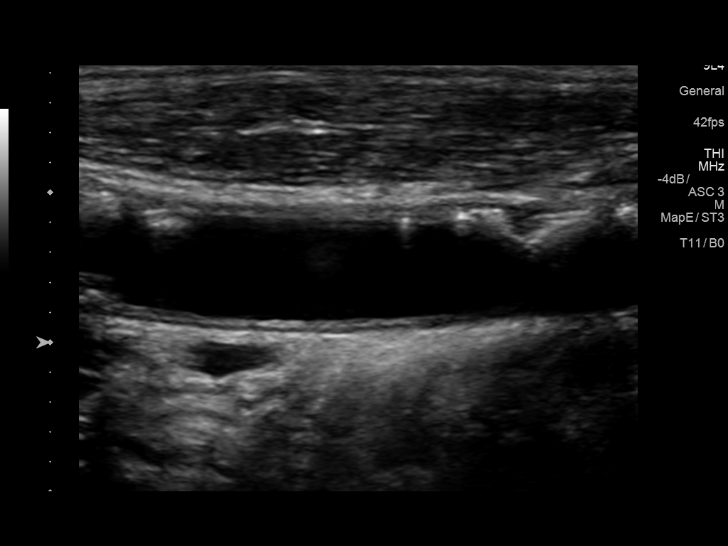
[im 30/87]
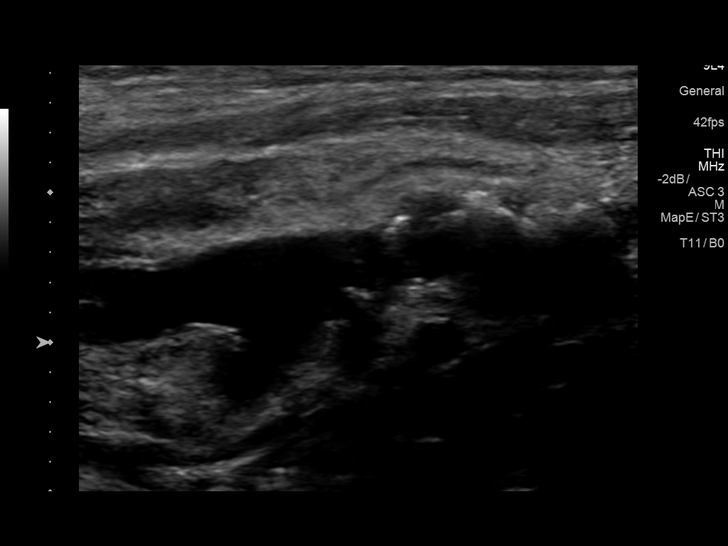
[im 38/87]
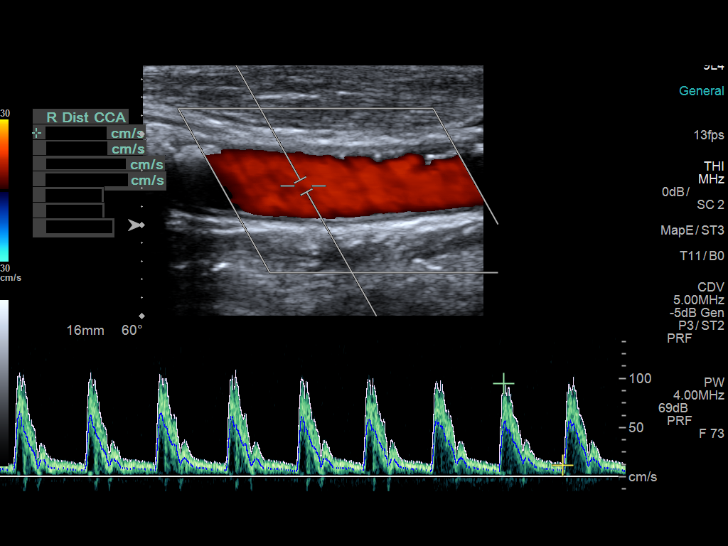
[im 45/87]
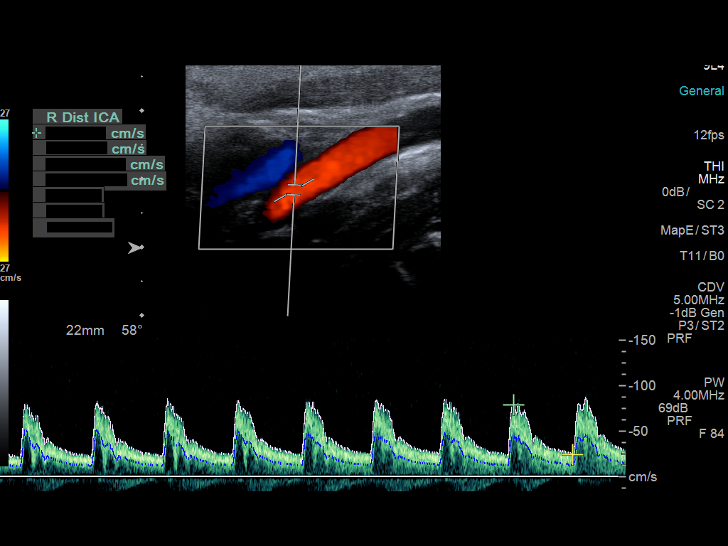
[im 49/87]
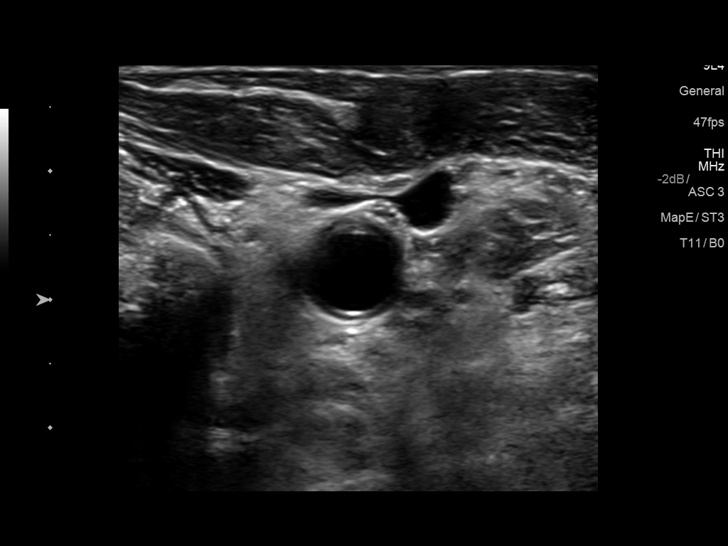
[im 57/87]
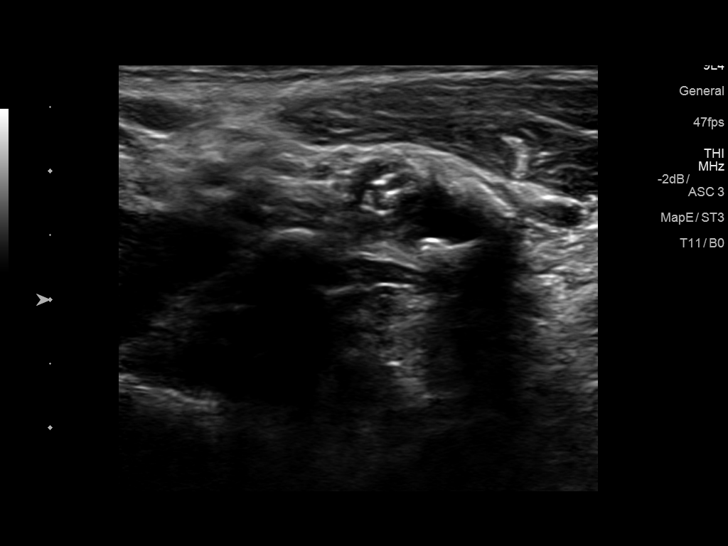
[im 64/87]
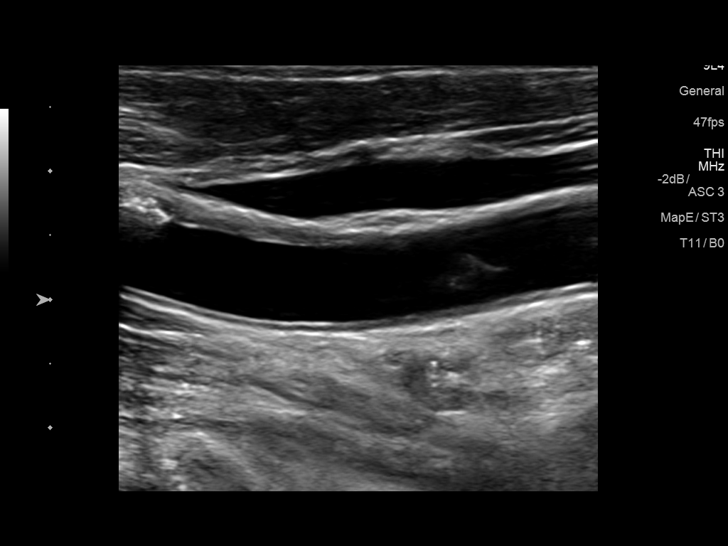
[im 72/87]
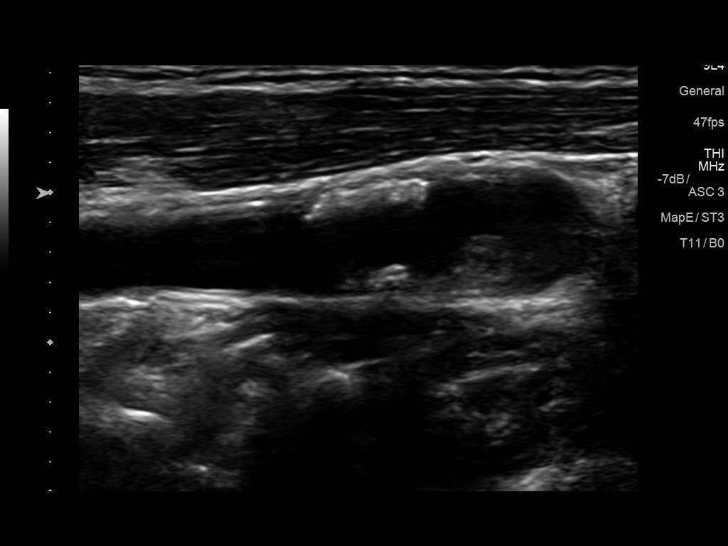
[im 79/87]
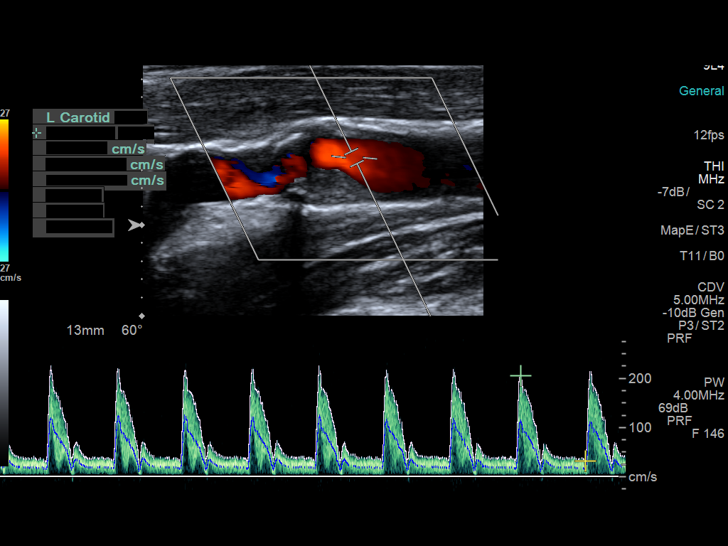
[im 87/87]
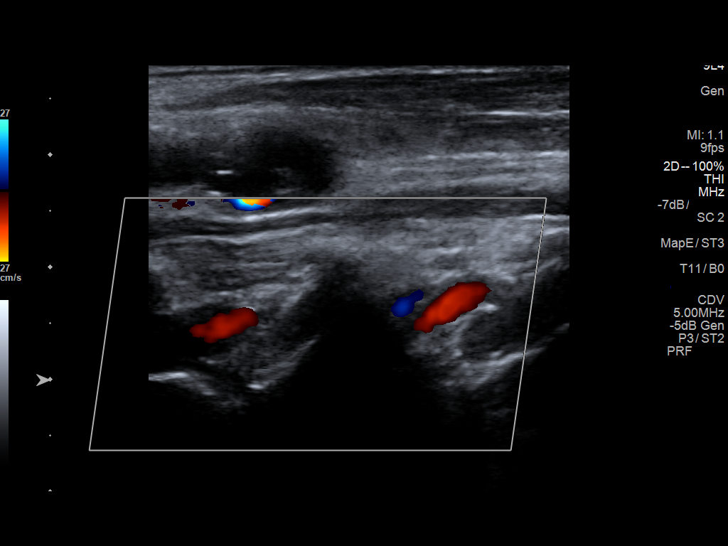

[13 of 24 positions shown; findings below may reference images not displayed]

FINDINGS: Criteria: Quantification of carotid stenosis is based on velocity
parameters that correlate the residual internal carotid diameter
with NASCET-based stenosis levels, using the diameter of the distal
internal carotid lumen as the denominator for stenosis measurement.

The following velocity measurements were obtained:

RIGHT

ICA:  113 cm/sec

CCA:  96 cm/sec

SYSTOLIC ICA/CCA RATIO:

DIASTOLIC ICA/CCA RATIO:

ECA:  255 cm/sec

LEFT

ICA:  410 cm/sec

CCA:  87 cm/sec

SYSTOLIC ICA/CCA RATIO:

DIASTOLIC ICA/CCA RATIO:

ECA:  345 cm/sec

RIGHT CAROTID ARTERY: Prominent calcified plaque along the mid and
upper common carotid artery. Moderate plaque in the bulb. Low
resistance internal carotid Doppler pattern.

RIGHT VERTEBRAL ARTERY:  Antegrade.

LEFT CAROTID ARTERY: Extensive irregular mixed calcified and soft
plaque in the left bulb. Low resistance internal carotid Doppler
pattern.

LEFT VERTEBRAL ARTERY:  Antegrade.
IMPRESSION: Less than 50% stenosis in the right internal carotid artery.

Greater than 70% stenosis in the left internal carotid artery.

## 2018-04-16 ENCOUNTER — Ambulatory Visit (INDEPENDENT_AMBULATORY_CARE_PROVIDER_SITE_OTHER): Payer: Medicare Other | Admitting: Podiatry

## 2018-04-16 ENCOUNTER — Ambulatory Visit (INDEPENDENT_AMBULATORY_CARE_PROVIDER_SITE_OTHER): Payer: Medicare Other | Admitting: Cardiovascular Disease

## 2018-04-16 ENCOUNTER — Encounter: Payer: Self-pay | Admitting: Cardiovascular Disease

## 2018-04-16 DIAGNOSIS — I6522 Occlusion and stenosis of left carotid artery: Secondary | ICD-10-CM

## 2018-04-16 DIAGNOSIS — I10 Essential (primary) hypertension: Secondary | ICD-10-CM | POA: Diagnosis not present

## 2018-04-16 DIAGNOSIS — Z951 Presence of aortocoronary bypass graft: Secondary | ICD-10-CM

## 2018-04-16 DIAGNOSIS — I739 Peripheral vascular disease, unspecified: Secondary | ICD-10-CM

## 2018-04-16 DIAGNOSIS — E785 Hyperlipidemia, unspecified: Secondary | ICD-10-CM

## 2018-04-16 DIAGNOSIS — E08621 Diabetes mellitus due to underlying condition with foot ulcer: Secondary | ICD-10-CM | POA: Diagnosis not present

## 2018-04-16 DIAGNOSIS — L97521 Non-pressure chronic ulcer of other part of left foot limited to breakdown of skin: Secondary | ICD-10-CM

## 2018-04-16 NOTE — Patient Instructions (Signed)
Medication Instructions:  none If you need a refill on your cardiac medications before your next appointment, please call your pharmacy.   Lab work: none If you have labs (blood work) drawn today and your tests are completely normal, you will receive your results only by: Marland Kitchen MyChart Message (if you have MyChart) OR . A paper copy in the mail If you have any lab test that is abnormal or we need to change your treatment, we will call you to review the results.  Testing/Procedures: Your physician has requested that you have a carotid duplex. This test is an ultrasound of the carotid arteries in your neck. It looks at blood flow through these arteries that supply the brain with blood. Allow one hour for this exam. There are no restrictions or special instructions.  MARCH 2020  Your physician has requested that you have a lower extremity arterial exercise duplex. During this test, exercise and ultrasound are used to evaluate arterial blood flow in the legs. Allow one hour for this exam. There are no restrictions or special instructions.  January 2021  Your physician has requested that you have an ankle brachial index (ABI). During this test an ultrasound and blood pressure cuff are used to evaluate the arteries that supply the arms and legs with blood. Allow thirty minutes for this exam. There are no restrictions or special instructions.  January 2021    Follow-Up: At [facility], you and your health needs are our priority.  As part of our continuing mission to provide you with exceptional heart care, we have created designated Provider Care Teams.  These Care Teams include your primary Cardiologist (physician) and Advanced Practice Providers (APPs -  Physician Assistants and Nurse Practitioners) who all work together to provide you with the care you need, when you need it. You will need a follow up appointment in 12 months.  Please call our office 2 months in advance to schedule this  appointment.  You may see [name], MD or one of the following Advanced Practice Providers on your designated Care Team:   Kerin Ransom, PA-C Roby Lofts, [place] . Sande Rives, PA-C

## 2018-04-16 NOTE — Assessment & Plan Note (Signed)
History of carotid artery disease by duplex ultrasound Dopplers performed 06/26/2017 revealing moderate least severe left ICA stenosis.  This will be followed up in March of this year.

## 2018-04-16 NOTE — Assessment & Plan Note (Signed)
History of CAD status post off-pump LIMA at [facility] October 2018.  Cardiac catheterization performed 04/27/2017 by Dr. Ellyn Hack revealed a patent LIMA to the LAD with otherwise noncritical disease and normal LV function.  He denies chest pain.

## 2018-04-16 NOTE — Patient Instructions (Signed)
Diabetes Mellitus and Foot Care  Foot care is an important part of your health, especially when you have diabetes. Diabetes may cause you to have problems because of poor blood flow (circulation) to your feet and legs, which can cause your skin to:   Become thinner and drier.   Break more easily.   Heal more slowly.   Peel and crack.  You may also have nerve damage (neuropathy) in your legs and feet, causing decreased feeling in them. This means that you may not notice minor injuries to your feet that could lead to more serious problems. Noticing and addressing any potential problems early is the best way to prevent future foot problems.  How to care for your feet  Foot hygiene   Wash your feet daily with warm water and mild soap. Do not use hot water. Then, pat your feet and the areas between your toes until they are completely dry. Do not soak your feet as this can dry your skin.   Trim your toenails straight across. Do not dig under them or around the cuticle. File the edges of your nails with an emery board or nail file.   Apply a moisturizing lotion or petroleum jelly to the skin on your feet and to dry, brittle toenails. Use lotion that does not contain alcohol and is unscented. Do not apply lotion between your toes.  Shoes and socks   Wear clean socks or stockings every day. Make sure they are not too tight. Do not wear knee-high stockings since they may decrease blood flow to your legs.   Wear shoes that fit properly and have enough cushioning. Always look in your shoes before you put them on to be sure there are no objects inside.   To break in new shoes, wear them for just a few hours a day. This prevents injuries on your feet.  Wounds, scrapes, corns, and calluses   Check your feet daily for blisters, cuts, bruises, sores, and redness. If you cannot see the bottom of your feet, use a mirror or ask someone for help.   Do not cut corns or calluses or try to remove them with medicine.   If you  find a minor scrape, cut, or break in the skin on your feet, keep it and the skin around it clean and dry. You may clean these areas with mild soap and water. Do not clean the area with peroxide, alcohol, or iodine.   If you have a wound, scrape, corn, or callus on your foot, look at it several times a day to make sure it is healing and not infected. Check for:  ? Redness, swelling, or pain.  ? Fluid or blood.  ? Warmth.  ? Pus or a bad smell.  General instructions   Do not cross your legs. This may decrease blood flow to your feet.   Do not use heating pads or hot water bottles on your feet. They may burn your skin. If you have lost feeling in your feet or legs, you may not know this is happening until it is too late.   Protect your feet from hot and cold by wearing shoes, such as at the beach or on hot pavement.   Schedule a complete foot exam at least once a year (annually) or more often if you have foot problems. If you have foot problems, report any cuts, sores, or bruises to your health care provider immediately.  Contact a health care provider if:     You have a medical condition that increases your risk of infection and you have any cuts, sores, or bruises on your feet.   You have an injury that is not healing.   You have redness on your legs or feet.   You feel burning or tingling in your legs or feet.   You have pain or cramps in your legs and feet.   Your legs or feet are numb.   Your feet always feel cold.   You have pain around a toenail.  Get help right away if:   You have a wound, scrape, corn, or callus on your foot and:  ? You have pain, swelling, or redness that gets worse.  ? You have fluid or blood coming from the wound, scrape, corn, or callus.  ? Your wound, scrape, corn, or callus feels warm to the touch.  ? You have pus or a bad smell coming from the wound, scrape, corn, or callus.  ? You have a fever.  ? You have a red line going up your leg.  Summary   Check your feet every day  for cuts, sores, red spots, swelling, and blisters.   Moisturize feet and legs daily.   Wear shoes that fit properly and have enough cushioning.   If you have foot problems, report any cuts, sores, or bruises to your health care provider immediately.   Schedule a complete foot exam at least once a year (annually) or more often if you have foot problems.  This information is not intended to replace advice given to you by your health care provider. Make sure you discuss any questions you have with your health care provider.  Document Released: 03/24/2000 Document Revised: 05/09/2017 Document Reviewed: 04/28/2016  Elsevier Interactive Patient Education  2019 [employer]

## 2018-04-16 NOTE — Assessment & Plan Note (Signed)
History of hyperlipidemia on statin therapy followed by his PCP 

## 2018-04-16 NOTE — Assessment & Plan Note (Signed)
History of peripheral arterial disease status post left SFA PTA and drug-eluting balloon angioplasty 12/11/2013.  His most recent Dopplers performed 04/11/2017 revealed non-compressive vessels with no evidence of obstructive disease.

## 2018-04-16 NOTE — Progress Notes (Signed)
04/16/2018 Jon Anderson   01/19/1951  [id number]  Primary Physician Jon Panda, MD Primary Cardiologist: Jon Harp MD Jon Anderson, [place]  HPI:  Jon Anderson is a 68 y.o.  mildly overweight married African American male father of 2, grandfather Of one grandchild who Works as a Sports coach. Hewasreferred by Jon Anderson for peripheral vascular evaluation prior to elective foot surgery. His primary care physician is Jon Anderson .I last saw him in the office 12/19/2017.  His cardiac risk factors include hypertension, diabetes and hyperlipidemia. He does not smoke. His sister died of a myocardial infarction at age 7. He has never had a heart attack or stroke and has had cardiac catheterization remotely that showed noncritical CAD. He does get fairly frequent chest pain and shortness of breath however. He has chronic renal sufficiency with creatinines in the mid 3 range thought to chronic kidney. Lower extremity arterial Doppler studies performed in the office 09/01/13 revealed a right ABI 1.1 the left of 0.76 with a high-frequency signal in the distal left SFA. He does complain of left calf claudication which is lifestyle limiting and affecting his ability to perform work.. There is no history of nonhealing wounds. Recent carotid Dopplers showed moderately severe left internal carotid artery stenosis.  Since I saw him last he has gone on dialysis February 2016. He also underwent off-pump LIMA insertion at [facility] in October of last year because of a high grade lesion that was discovered during a workup for renal transplant. He will cardiac catheterization by Jon Anderson 04/27/17 revealed a patent LIMA to the LAD with otherwise noncritical disease and normal LV function. He currently denies chest pain. He is scheduled for laminectomy on 07/03/17. Carotid Dopplers performed at [facility] imaging showed moderately severe left ICA stenosis which when compared to  the Dopplers performed in our office 10/15/13, did not appear significantly different. However, given the severity of the lesion I n the 70-90% range CTA of his neck revealing no significant obstructive disease in his carotid circulation.  Since I saw him 3 months ago he continues to do well.  He has had back surgery recently which he is recovering from.  Jon Anderson continues to work with his calluses.  He had recent lower extremity arterial Doppler studies performed 04/12/2018 which revealed no significant obstructive disease.  Carotid Dopplers performed in March of last year did show a moderately severe left ICA stenosis.   Current Meds  Medication Sig  . acetaminophen (TYLENOL) 500 MG tablet Take 1,000 mg by mouth 2 (two) times daily as needed for moderate pain or headache.  Marland Kitchen aspirin EC 81 MG tablet Take 1 tablet (81 mg total) by mouth daily.  Marland Kitchen atorvastatin (LIPITOR) 40 MG tablet Take 1 tablet (40 mg total) by mouth daily at 6 PM.  . calcium acetate (PHOSLO) 667 MG capsule Take 3 capsules (2,001 mg total) by mouth 3 (three) times daily with meals. (Patient taking differently: Take 1,334 mg by mouth 2 (two) times daily with a meal. )  . cyclobenzaprine (FLEXERIL) 5 MG tablet Take 1 tablet (5 mg total) by mouth 3 (three) times daily as needed for muscle spasms.  . diclofenac (VOLTAREN) 75 MG EC tablet Take 75 mg by mouth 2 (two) times daily.  Marland Kitchen gabapentin (NEURONTIN) 100 MG capsule Take 100 mg by mouth 2 (two) times daily.  Marland Kitchen HYDROcodone-acetaminophen (NORCO/VICODIN) 5-325 MG tablet Take 1 tablet by mouth every 4 (four) hours as needed  for moderate pain ((score 4 to 6)).  . multivitamin (RENA-VIT) TABS tablet Take 1 tablet by mouth at bedtime.  . nitroGLYCERIN (NITROSTAT) 0.4 MG SL tablet Place 1 tablet (0.4 mg total) under the tongue every 5 (five) minutes as needed for chest pain.  . pantoprazole (PROTONIX) 40 MG tablet Take 40 mg by mouth daily.  . polyethylene glycol (MIRALAX / GLYCOLAX) packet  Take 17 g by mouth daily as needed for moderate constipation.  Marland Kitchen rOPINIRole (REQUIP) 0.5 MG tablet Take 0.5 mg by mouth 2 (two) times daily.      Allergies  Allergen Reactions  . Kiwi Extract Swelling and Other (See Comments)    Facial swelling   . Tape Other (See Comments)    Plastic tape-blistering    Social History   Socioeconomic History  . Marital status: Married    Spouse name: Not on file  . Number of children: Not on file  . Years of education: Not on file  . Highest education level: Not on file  Occupational History  . Not on file  Social Needs  . Financial resource strain: Not on file  . Food insecurity:    Worry: Not on file    Inability: Not on file  . Transportation needs:    Medical: Not on file    Non-medical: Not on file  Tobacco Use  . Smoking status: Former Smoker    Packs/day: 1.00    Years: 2.00    Pack years: 2.00    Types: Cigarettes  . Smokeless tobacco: Never Used  . Tobacco comment: quit smoking 40 yrs ago  Substance and Sexual Activity  . Alcohol use: No    Alcohol/week: 0.0 standard drinks  . Drug use: No  . Sexual activity: Yes    Birth control/protection: None  Lifestyle  . Physical activity:    Days per week: Not on file    Minutes per session: Not on file  . Stress: Not on file  Relationships  . Social connections:    Talks on phone: Not on file    Gets together: Not on file    Attends religious service: Not on file    Active member of club or organization: Not on file    Attends meetings of clubs or organizations: Not on file    Relationship status: Not on file  . Intimate partner violence:    Fear of current or ex partner: Not on file    Emotionally abused: Not on file    Physically abused: Not on file    Forced sexual activity: Not on file  Other Topics Concern  . Not on file  Social History Narrative   The patient is a Retail buyer.  He is married and has 2 grown children.  Lives in [place] with his wife.  Denies tobacco,  alcohol or IV drug abuse or marijuana or cocaine intake.      Review of Systems: General: negative for chills, fever, night sweats or weight changes.  Cardiovascular: negative for chest pain, dyspnea on exertion, edema, orthopnea, palpitations, paroxysmal nocturnal dyspnea or shortness of breath Dermatological: negative for rash Respiratory: negative for cough or wheezing Urologic: negative for hematuria Abdominal: negative for nausea, vomiting, diarrhea, bright red blood per rectum, melena, or hematemesis Neurologic: negative for visual changes, syncope, or dizziness All other systems reviewed and are otherwise negative except as noted above.    Blood pressure 138/76, pulse 75, height 5\' 11"  (1.803 m), weight 160 lb (72.6 kg).  General  appearance: alert and no distress Neck: no adenopathy, no JVD, supple, symmetrical, trachea midline, thyroid not enlarged, symmetric, no tenderness/mass/nodules and Bilateral carotid bruits right louder than left Lungs: clear to auscultation bilaterally Heart: regular rate and rhythm, S1, S2 normal, no murmur, click, rub or gallop Extremities: extremities normal, atraumatic, no cyanosis or edema Pulses: 2+ and symmetric Skin: Skin color, texture, turgor normal. No rashes or lesions Neurologic: Alert and oriented X 3, normal strength and tone. Normal symmetric reflexes. 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  Cardiac catheterization performed 04/27/2017 by Jon Anderson revealed a patent LIMA to the LAD with otherwise noncritical disease and normal LV function.  He denies chest pain.  Hyperlipidemia LDL goal <70 History of hyperlipidemia on statin therapy followed by his PCP  Hypertension History of essential hypertension her blood pressure measured today 138/76.  He is not on antihypertensive  medications.  Peripheral arterial disease History of peripheral arterial disease status post left SFA PTA and drug-eluting balloon angioplasty 12/11/2013.  His most recent Dopplers performed 04/11/2017 revealed non-compressive vessels with no evidence of obstructive disease.  Carotid artery disease (HCC) History of carotid artery disease by duplex ultrasound Dopplers performed 06/26/2017 revealing moderate least severe left ICA stenosis.  This will be followed up in March of this year.      Jon Harp MD FACP,FACC,FAHA, [facility] 04/16/2018 11:46 AM

## 2018-04-16 NOTE — Assessment & Plan Note (Signed)
History of essential hypertension her blood pressure measured today 138/76.  He is not on antihypertensive medications.

## 2018-04-22 ENCOUNTER — Emergency Department (HOSPITAL_COMMUNITY)
Admission: EM | Admit: 2018-04-22 | Discharge: 2018-04-23 | Disposition: A | Discharge status: Home / Self Care | Payer: Medicare Other | Attending: Emergency Medicine | Admitting: Emergency Medicine

## 2018-04-22 ENCOUNTER — Emergency Department (HOSPITAL_COMMUNITY): Payer: Medicare Other

## 2018-04-22 DIAGNOSIS — N186 End stage renal disease: Secondary | ICD-10-CM | POA: Insufficient documentation

## 2018-04-22 DIAGNOSIS — R1013 Epigastric pain: Secondary | ICD-10-CM

## 2018-04-22 DIAGNOSIS — Z7982 Long term (current) use of aspirin: Secondary | ICD-10-CM | POA: Diagnosis not present

## 2018-04-22 DIAGNOSIS — I120 Hypertensive chronic kidney disease with stage 5 chronic kidney disease or end stage renal disease: Secondary | ICD-10-CM | POA: Insufficient documentation

## 2018-04-22 DIAGNOSIS — E119 Type 2 diabetes mellitus without complications: Secondary | ICD-10-CM | POA: Insufficient documentation

## 2018-04-22 DIAGNOSIS — Z87891 Personal history of nicotine dependence: Secondary | ICD-10-CM | POA: Diagnosis not present

## 2018-04-22 DIAGNOSIS — Z79899 Other long term (current) drug therapy: Secondary | ICD-10-CM | POA: Diagnosis not present

## 2018-04-22 LAB — COMPREHENSIVE METABOLIC PANEL
ALT: 7 U/L (ref 0–44)
AST: 12 U/L — ABNORMAL LOW (ref 15–41)
Albumin: 3.5 g/dL (ref 3.5–5.0)
Alkaline Phosphatase: 95 U/L (ref 38–126)
Anion gap: 13 (ref 5–15)
BUN: 28 mg/dL — ABNORMAL HIGH (ref 8–23)
CO2: 32 mmol/L (ref 22–32)
Calcium: 9.5 mg/dL (ref 8.9–10.3)
Chloride: 87 mmol/L — ABNORMAL LOW (ref 98–111)
Creatinine, Ser: 11.58 mg/dL — ABNORMAL HIGH (ref 0.61–1.24)
GFR calc Af Amer: 5 mL/min — ABNORMAL LOW (ref >60)
GFR calc non Af Amer: 4 mL/min — ABNORMAL LOW (ref >60)
Glucose, Bld: 81 mg/dL (ref 70–99)
Potassium: 3.6 mmol/L (ref 3.5–5.1)
Sodium: 132 mmol/L — ABNORMAL LOW (ref 135–145)
Total Bilirubin: 0.6 mg/dL (ref 0.3–1.2)
Total Protein: 7.2 g/dL (ref 6.5–8.1)

## 2018-04-22 LAB — CBC
HCT: 35.9 % — ABNORMAL LOW (ref 39.0–52.0)
Hemoglobin: 11.2 g/dL — ABNORMAL LOW (ref 13.0–17.0)
MCH: 30.6 pg (ref 26.0–34.0)
MCHC: 31.2 g/dL (ref 30.0–36.0)
MCV: 98.1 fL (ref 80.0–100.0)
Platelets: 321 10*3/uL (ref 150–400)
RBC: 3.66 MIL/uL — ABNORMAL LOW (ref 4.22–5.81)
RDW: 16.2 % — ABNORMAL HIGH (ref 11.5–15.5)
WBC: 5.9 10*3/uL (ref 4.0–10.5)
nRBC: 0 % (ref 0.0–0.2)

## 2018-04-22 LAB — I-STAT TROPONIN, ED: Troponin i, poc: 0.02 ng/mL (ref 0.00–0.08)

## 2018-04-22 NOTE — ED Triage Notes (Signed)
Pt reports n.v since last night along with central chest pain. Last dialysis session on Friday, did not go today due to illness.

## 2018-04-23 ENCOUNTER — Other Ambulatory Visit: Payer: Self-pay

## 2018-04-23 ENCOUNTER — Emergency Department (HOSPITAL_COMMUNITY): Payer: Medicare Other

## 2018-04-23 DIAGNOSIS — R1013 Epigastric pain: Secondary | ICD-10-CM | POA: Diagnosis not present

## 2018-04-23 LAB — LIPASE, BLOOD: Lipase: 24 U/L (ref 11–51)

## 2018-04-23 LAB — I-STAT CG4 LACTIC ACID, ED: Lactic Acid, Venous: 1.33 mmol/L (ref 0.5–1.9)

## 2018-04-23 MED ORDER — IOHEXOL 300 MG/ML  SOLN
100.0000 mL | Freq: Once | INTRAMUSCULAR | Status: AC | PRN
  Administered 2018-04-23: 100 mL via INTRAVENOUS

## 2018-04-23 NOTE — Discharge Instructions (Addendum)
Follow-up at dialysis tomorrow.  If you have any worsening fever, worsening abdominal pain or other symptoms of concern, return to the ER.

## 2018-04-23 NOTE — ED Provider Notes (Signed)
[facility] EMERGENCY DEPARTMENT Provider Note   CSN: [id number] Arrival date & time: 04/22/18  1946     History   Chief Complaint Chief Complaint  Patient presents with  . Emesis  . Chest Pain    HPI Jon Anderson is a 68 y.o. male.  Patient presents with complaints of nausea, vomiting and abdominal pain.  Patient reports that symptoms began yesterday.  He reports that he has had recurrent vomiting without diarrhea.  No cough or other cold symptoms.  Has not been able to take his medicines because of the vomiting.  He has noticed some intermittent light pains "up under the ribs".  He reports that right before he vomits the pain significantly worsens and then is alleviated by vomiting, but then comes back.  He does not have any shortness of breath.     Past Medical History:  Diagnosis Date  . Anemia of chronic disease   . CAD (coronary artery disease)    a.  Myoview 4/11: EF 53%, no scar or ischemia   c. MV 2012 Nl perfusion, apical thinning.  No ischemia or scar.  EF 49%, appears greater by visual estimate.;  d.  Dob stress echo 12/13:  Negative Dob stress echo. There is no evidence of ischemia.  The LVF is normal. b. Normal cors 2016.  . Carotid stenosis    a. <40% RICA, >34% LICA by duplex 10/4257  . Chronic chest pain    occ  . ESRD (end stage renal disease) on dialysis ([place])    M-W-F  . GERD (gastroesophageal reflux disease)   . HNP (herniated nucleus pulposus), lumbar   . HTN (hypertension)    echo 3/10: EF 60%, LAE  . Hyperlipidemia   . Nephrolithiasis    "passed them all"  . Peripheral arterial disease ([place])    a. s/p PTCA to L SFA.  Marland Kitchen Pneumonia   . Restless legs   . Sleep apnea    no cpap, needs to reschedule appointment to set up aquiring cpap  . Snores    a. presumed OSA, pt has refused sleep eval in past.  . Type II diabetes mellitus ([place])    no longer on medications, checks blood glucose at home    Patient Active Problem List     Diagnosis Date Noted  . DM neuropathy with neurologic complication ([place]) 56/38/7564  . GERD (gastroesophageal reflux disease) 02/28/2018  . Nephrolithiasis 02/28/2018  . Sciatic leg pain 02/28/2018  . Snores 02/28/2018  . Orthopnea 02/23/2018  . Elevated troponin 02/23/2018  . HNP (herniated nucleus pulposus), lumbar 07/03/2017  . Chest pain in adult 04/27/2017  . Restless leg syndrome, uncontrolled 04/27/2017  . 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 Anemia of chronic disease 03/17/2017  . Severe uncontrolled hypertension 03/17/2017  . ESRD (end stage renal disease) on dialysis ([place]) 03/17/2017  . Hyperlipidemia 03/17/2017  . Chronic chest pain 03/17/2017  . Type II diabetes mellitus ([place]) 03/17/2017  . Acute on chronic diastolic heart failure ([place]) 03/17/2017  . Acute heart failure ([place]) 03/17/2017  . Hemodialysis status ([place]) 01/25/2017  . Lumbar radiculopathy 01/16/2017  . Pre-transplant evaluation for kidney transplant 06/15/2016  . Increased frequency of urination 12/17/2015  . Nocturia 12/17/2015  . Chest pain, non-cardiac 10/09/2015  . Acute on chronic renal failure ([place]) 06/16/2014  . Renal failure (ARF), acute on chronic (H[facility]) 06/16/2014  . Shoulder pain, left 12/15/2013  . Chest pain 12/15/2013  . Claudication ([place]) 12/11/2013  .  PVD (peripheral vascular disease) ([place]) 12/11/2013  . Peripheral arterial disease ([place]) 09/30/2013  . Carotid artery disease ([place]) 09/30/2013  . Acute chest pain 11/15/2012  . Left-sided chest wall pain 03/19/2012  . Bruit 09/15/2010  . CAD (coronary artery disease) nonobstructive per cath 2012   . Precordial chest pain 07/26/2009  . DM (diabetes mellitus) ([place]) 07/22/2009  . Hyperlipidemia LDL goal <70 07/22/2009  . Hypertension 07/22/2009    Past Surgical History:  Procedure Laterality Date  . ANGIOPLASTY / STENTING FEMORAL Left 12/11/2013   dr berry  . AV FISTULA PLACEMENT Left 03/19/2014   Procedure: CREATION OF  ARTERIOVENOUS (AV) FISTULA  LEFT UPPER ARM;  Surgeon: Mal Misty, MD;  Location: [facility];  Service: Vascular;  Laterality: Left;  . CARDIAC CATHETERIZATION  2001 and 2010   . COLONOSCOPY W/ BIOPSIES AND POLYPECTOMY    . COLONOSCOPY WITH PROPOFOL N/A 08/01/2016   Procedure: COLONOSCOPY WITH PROPOFOL;  Surgeon: Carol Ada, MD;  Location: [facility];  Service: Endoscopy;  Laterality: N/A;  . CORONARY ARTERY BYPASS GRAFT  2018  . ESOPHAGOGASTRODUODENOSCOPY (EGD) WITH PROPOFOL N/A 08/01/2016   Procedure: ESOPHAGOGASTRODUODENOSCOPY (EGD) WITH PROPOFOL;  Surgeon: Carol Ada, MD;  Location: [facility];  Service: Endoscopy;  Laterality: N/A;  . FOOT FRACTURE SURGERY Right    ligament repair  . FRACTURE SURGERY    . INGUINAL HERNIA REPAIR Left   . LEFT HEART CATH AND CORS/GRAFTS ANGIOGRAPHY N/A 04/27/2017   Procedure: LEFT HEART CATH AND CORS/GRAFTS ANGIOGRAPHY;  Surgeon: Leonie Man, MD;  Location: [facility];  Service: Cardiovascular;  Laterality: N/A;  . LEFT HEART CATHETERIZATION WITH CORONARY ANGIOGRAM N/A 06/22/2014   Procedure: LEFT HEART CATHETERIZATION WITH CORONARY ANGIOGRAM;  Surgeon: Troy Sine, MD;  Location: [facility] CATH LAB;  Service: Cardiovascular;  Laterality: N/A;  . LOWER EXTREMITY ANGIOGRAM Left 12/11/2013   Procedure: LOWER EXTREMITY ANGIOGRAM;  Surgeon: Lorretta Harp, MD;  Location[facility] CATH LAB;  Service: Cardiovascular;  Laterality: Left;  . LUMBAR LAMINECTOMY/DECOMPRESSION MICRODISCECTOMY Right 07/03/2017   Procedure: MICRODISCECTOMY LUMBAR FIVE - SACRAL ONE RIGHT;  Surgeon: Consuella Lose, MD;  Location: [facility];  Service: Neurosurgery;  Laterality: Right;  . LUMBAR LAMINECTOMY/DECOMPRESSION MICRODISCECTOMY Right 10/19/2017   Procedure: MICRODISCECTOMY LUMBAR FIVE- SACRAL 1 ONE ;  Surgeon: Consuella Lose, MD;  Location: [facility];  Service: Neurosurgery;  Laterality: Right;  . TONSILLECTOMY AND ADENOIDECTOMY    . WISDOM TOOTH EXTRACTION          Home  Medications    Prior to Admission medications   Medication Sig Start Date End Date Taking? Authorizing Provider  acetaminophen (TYLENOL) 500 MG tablet Take 1,000 mg by mouth 2 (two) times daily as needed for moderate pain or headache.   Yes [provider]  aspirin EC 81 MG tablet Take 1 tablet (81 mg total) by mouth daily. 05/28/12  Yes Weaver, Scott T, PA-C  atorvastatin (LIPITOR) 40 MG tablet Take 1 tablet (40 mg total) by mouth daily at 6 PM. 12/13/13  Yes Brett Canales, PA-C  calcium acetate (PHOSLO) 667 MG capsule Take 3 capsules (2,001 mg total) by mouth 3 (three) times daily with meals. Patient taking differently: Take 1,334 mg by mouth 2 (two) times daily with a meal.  10/09/15  Yes Short, Noah Delaine, MD  cyclobenzaprine (FLEXERIL) 5 MG tablet Take 1 tablet (5 mg total) by mouth 3 (three) times daily as needed for muscle spasms. 02/17/18  Yes Drenda Freeze, MD  diclofenac (VOLTAREN) 75 MG  EC tablet Take 75 mg by mouth 2 (two) times daily as needed for mild pain.    Yes [provider]  gabapentin (NEURONTIN) 100 MG capsule Take 100 mg by mouth See admin instructions. Take 100 mg in the morning and 300 mg in the evening   Yes [provider]  multivitamin (RENA-VIT) TABS tablet Take 1 tablet by mouth at bedtime. 06/23/14  Yes Domenic Polite, MD  nitroGLYCERIN (NITROSTAT) 0.4 MG SL tablet Place 1 tablet (0.4 mg total) under the tongue every 5 (five) minutes as needed for chest pain. 03/20/12  Yes Barrett, Evelene Croon, PA-C  oxyCODONE-acetaminophen (PERCOCET/ROXICET) 5-325 MG tablet Take 1 tablet by mouth every 4 (four) hours as needed for moderate pain or severe pain.   Yes [provider]  pantoprazole (PROTONIX) 40 MG tablet Take 40 mg by mouth daily.   Yes [provider]  polyethylene glycol (MIRALAX / GLYCOLAX) packet Take 17 g by mouth daily as needed for moderate constipation. 03/22/17  Yes Aline August, MD  rOPINIRole (REQUIP) 0.5 MG tablet  Take 0.5 mg by mouth 2 (two) times daily.    Yes [provider]  HYDROcodone-acetaminophen (NORCO/VICODIN) 5-325 MG tablet Take 1 tablet by mouth every 4 (four) hours as needed for moderate pain ((score 4 to 6)). Patient not taking: Reported on 04/23/2018 03/17/18   Meyran, Ocie Cornfield, NP    Family History Family History  Problem Relation Age of Onset  . Heart attack Sister        died @ 97  . Cancer Mother        died @ 56; unknown type  . Diabetes Brother        deceased  . Cirrhosis Father        alcohol related  . Diabetes Father   . Esophageal cancer Neg Hx   . Colon cancer Neg Hx   . Pancreatic cancer Neg Hx   . Stomach cancer Neg Hx     Social History Social History   Tobacco Use  . Smoking status: Former Smoker    Packs/day: 1.00    Years: 2.00    Pack years: 2.00    Types: Cigarettes  . Smokeless tobacco: Never Used  . Tobacco comment: quit smoking 40 yrs ago  Substance Use Topics  . Alcohol use: No    Alcohol/week: 0.0 standard drinks  . Drug use: No     Allergies   Kiwi extract and Tape   Review of Systems Review of Systems  Gastrointestinal: Positive for abdominal pain, nausea and vomiting. Negative for constipation.  All other systems reviewed and are negative.    Physical Exam Updated Vital Signs BP (!) 152/85   Pulse 74   Temp (!) 100.7 F (38.2 C) (Oral)   Resp 14   SpO2 97%   Physical Exam Vitals signs and nursing note reviewed.  Constitutional:      General: He is not in acute distress.    Appearance: Normal appearance. He is well-developed.  HENT:     Head: Normocephalic and atraumatic.     Right Ear: Hearing normal.     Left Ear: Hearing normal.     Nose: Nose normal.  Eyes:     Conjunctiva/sclera: Conjunctivae normal.     Pupils: Pupils are equal, round, and reactive to light.  Neck:     Musculoskeletal: Normal range of motion and neck supple.  Cardiovascular:     Rate and Rhythm: Regular rhythm.  Heart  sounds: S1 normal and S2 normal. No murmur. No friction rub. No gallop.   Pulmonary:     Effort: Pulmonary effort is normal. No respiratory distress.     Breath sounds: Normal breath sounds.  Chest:     Chest wall: No tenderness.  Abdominal:     General: Bowel sounds are normal.     Palpations: Abdomen is soft.     Tenderness: There is no abdominal tenderness. There is no guarding or rebound. Negative signs include Murphy's sign and McBurney's sign.     Hernia: No hernia is present.  Musculoskeletal: Normal range of motion.  Skin:    General: Skin is warm and dry.     Findings: No rash.  Neurological:     Mental Status: He is alert and oriented to person, place, and time.     GCS: GCS eye subscore is 4. GCS verbal subscore is 5. GCS motor subscore is 6.     Cranial Nerves: No cranial nerve deficit.     Sensory: No sensory deficit.     Coordination: Coordination normal.  Psychiatric:        Speech: Speech normal.        Behavior: Behavior normal.        Thought Content: Thought content normal.      ED Treatments / Results  Labs (all labs ordered are listed, but only abnormal results are displayed) Labs Reviewed  CBC - Abnormal; Notable for the following components:      Result Value   RBC 3.66 (*)    Hemoglobin 11.2 (*)    HCT 35.9 (*)    RDW 16.2 (*)    All other components within normal limits  COMPREHENSIVE METABOLIC PANEL - Abnormal; Notable for the following components:   Sodium 132 (*)    Chloride 87 (*)    BUN 28 (*)    Creatinine, Ser 11.58 (*)    AST 12 (*)    GFR calc non Af Amer 4 (*)    GFR calc Af Amer 5 (*)    All other components within normal limits  CULTURE, BLOOD (ROUTINE X 2)  CULTURE, BLOOD (ROUTINE X 2)  LIPASE, BLOOD  I-STAT TROPONIN, ED  I-STAT CG4 LACTIC ACID, ED    EKG EKG Interpretation  Date/Time:  Monday April 22 2018 19:57:48 EST Ventricular Rate:  85 PR Interval:  138 QRS Duration: 94 QT Interval:  388 QTC  Calculation: 461 R Axis:   26 Text Interpretation:  Sinus rhythm with Premature supraventricular complexes and with occasional Premature ventricular complexes Septal infarct , age undetermined ST & T wave abnormality, consider lateral ischemia Abnormal ECG No significant change since last tracing Confirmed by Duffy Bruce [phone]) on 04/22/2018 8:04:26 PM   Radiology Dg Chest 2 View  Result Date: 04/22/2018 CLINICAL DATA:  Nausea vomiting and central chest pain. EXAM: CHEST - 2 VIEW COMPARISON:  Chest radiograph 02/25/2018 FINDINGS: Cardiomediastinal silhouette is normal. Mediastinal contours appear intact. Calcific atherosclerotic disease of the aorta. There is no evidence of focal airspace consolidation, pleural effusion or pneumothorax. Osseous structures are without acute abnormality. Soft tissues are grossly normal. IMPRESSION: No active cardiopulmonary disease. Calcific atherosclerotic disease of the aorta. Electronically Signed   By: Fidela Salisbury M.D.   On: 04/22/2018 20:40   Ct Abdomen Pelvis W Contrast  Result Date: 04/23/2018 CLINICAL DATA:  Acute generalized abdominal pain and fever. History of diabetes and nephrolithiasis. EXAM: CT ABDOMEN AND PELVIS WITH CONTRAST TECHNIQUE: Multidetector CT  imaging of the abdomen and pelvis was performed using the standard protocol following bolus administration of intravenous contrast. CONTRAST:  172mL OMNIPAQUE IOHEXOL 300 MG/ML  SOLN COMPARISON:  None. FINDINGS: Lower chest: Mild dependent changes in the lung bases. Coronary artery calcifications. Hepatobiliary: No focal liver abnormality is seen. No gallstones, gallbladder wall thickening, or biliary dilatation. Pancreas: Unremarkable. No pancreatic ductal dilatation or surrounding inflammatory changes. Spleen: Normal in size without focal abnormality. Adrenals/Urinary Tract: No adrenal gland nodules. Bilateral renal atrophy. Nephrograms are symmetrical. No hydronephrosis or hydroureter. Mild  diffuse bladder wall thickening likely representing hypertrophy due to outflow obstruction. Stomach/Bowel: Stomach, small bowel, and colon are not abnormally distended. No wall thickening appreciated. Scattered stool throughout the colon. Appendix is normal. Vascular/Lymphatic: Aortic atherosclerosis. No enlarged abdominal or pelvic lymph nodes. Reproductive: Prostate gland is enlarged, measuring 5.6 cm diameter. Other: Postoperative changes consistent with right inguinal hernia repair. No free air or free fluid in the abdomen. Musculoskeletal: Postoperative changes with posterior rod and screw fixation and intervertebral disc prosthesis at L4-5 and L5-S1. Schmorl's node at the inferior endplate of L3. Degenerative changes in the spine and hips. IMPRESSION: No acute process demonstrated in the abdomen or pelvis. No evidence of bowel obstruction or inflammation. Prostate gland is enlarged. Bilateral renal atrophy. Aortic Atherosclerosis (ICD10-I70.0). Electronically Signed   By: Lucienne Capers M.D.   On: 04/23/2018 03:39    Procedures Procedures (including critical care time)  Medications Ordered in ED Medications  iohexol (OMNIPAQUE) 300 MG/ML solution 100 mL (100 mLs Intravenous Contrast Given 04/23/18 0321)     Initial Impression / Assessment and Plan / ED Course  I have reviewed the triage vital signs and the nursing notes.  Pertinent labs & imaging results that were available during my care of the patient were reviewed by me and considered in my medical decision making (see chart for details).     Patient presents to the emergency department for evaluation of upper abdominal pain.  He reports nausea and vomiting associated with the pain.  He denies any chest pain and he has not had shortness of breath.  He does not have a cough or any cold symptoms.  He was noted to have a fever at arrival here.  Source of fever is unclear.  He does not make urine.  He does not have respiratory symptoms and  chest x-ray is clear.  His only complaint was vomiting and abdominal pain, underwent CT scan which was normal.  His pain has completely resolved here in the ER without intervention.  He has not had any vomiting, reports that he is comfortable at baseline.  All of his vitals are normal.  No tachycardia.  No hypotension.  His white count is 5.9.  Lactic acid was normal.  Patient does not have any rash.  Patient's fistula has a strong thrill, no overlying erythema or signs of infection.  Work-up and examination reassuring that there are no bacterial infections causing the patient's fever.  Patient had serial examinations.  He remains comfortable.  Abdominal exam is benign and nontender at this time.  He did miss his last dialysis session.  No evidence of volume overload, electrolytes are normal.  Patient will follow-up tomorrow at his normal dialysis session, was counseled to return to the ER for any worsening symptoms.  Final Clinical Impressions(s) / ED Diagnoses   Final diagnoses:  Epigastric pain    ED Discharge Orders    None       Tamotsu Wiederholt, Gwenyth Allegra, MD  04/23/18 0622  

## 2018-04-28 LAB — CULTURE, BLOOD (ROUTINE X 2)
Culture: NO GROWTH
Culture: NO GROWTH
Special Requests: ADEQUATE

## 2018-05-06 ENCOUNTER — Encounter: Payer: Self-pay | Admitting: Podiatry

## 2018-05-06 NOTE — Progress Notes (Signed)
Subjective: Jon Anderson is a 68 y.o. y.o. male who presents for preventative foot care today with PAD for chronic painful preulcerative lesions b/l feet.  He relates swelling finally resolved in his left foot.  [name], MD is his PCP.   Allergies  Allergen Reactions  . Kiwi Extract Swelling and Other (See Comments)    Facial swelling   . Tape Other (See Comments)    Plastic tape-blistering    Objective: Vascular Examination: Capillary refill time <3 seconds x 10 digits  Dorsalis pedis pulses absent b/l  Posterior tibial pulses faintly palpable  b/l  No digital hair x 10 digits  Skin temperature gradient WNL  Dermatological Examination: Skin thin, shiny and atrophic b/l  Hyperkeratotic lesion noted submetatarsal head 5 b/l with no flocculence, no edema, no erythema, no drainage.  Musculoskeletal: Muscle strength 5/5 to all LE muscle groups  Neurological: Sensation diminished with 10 gram monofilament.  Vibratory sensation diminished   Assessment: 1. Preulcerative Calluses submet head 5 b/l 2. Peripheral arterial disease  Plan: 1. Discuss diabetic foot care principles. Literature dispensed. 2. Hyperkeratotic lesion gently pared and  smoothed with burr b/l submet head 5. 3. Patient to continue soft, supportive shoe gear 4. Patient to report any pedal injuries to medical professional  5. Follow up 1 month, sooner if new probem arises. 6. Patient/POA to call should there be a concern in the interim.

## 2018-05-21 ENCOUNTER — Ambulatory Visit (INDEPENDENT_AMBULATORY_CARE_PROVIDER_SITE_OTHER): Payer: Medicare Other | Admitting: Podiatry

## 2018-05-21 DIAGNOSIS — L84 Corns and callosities: Secondary | ICD-10-CM | POA: Diagnosis not present

## 2018-05-21 DIAGNOSIS — E1151 Type 2 diabetes mellitus with diabetic peripheral angiopathy without gangrene: Secondary | ICD-10-CM | POA: Diagnosis not present

## 2018-05-21 NOTE — Patient Instructions (Signed)
Diabetes Mellitus and Foot Care Foot care is an important part of your health, especially when you have diabetes. Diabetes may cause you to have problems because of poor blood flow (circulation) to your feet and legs, which can cause your skin to:  Become thinner and drier.  Break more easily.  Heal more slowly.  Peel and crack. You may also have nerve damage (neuropathy) in your legs and feet, causing decreased feeling in them. This means that you may not notice minor injuries to your feet that could lead to more serious problems. Noticing and addressing any potential problems early is the best way to prevent future foot problems. How to care for your feet Foot hygiene  Wash your feet daily with warm water and mild soap. Do not use hot water. Then, pat your feet and the areas between your toes until they are completely dry. Do not soak your feet as this can dry your skin.  Trim your toenails straight across. Do not dig under them or around the cuticle. File the edges of your nails with an emery board or nail file.  Apply a moisturizing lotion or petroleum jelly to the skin on your feet and to dry, brittle toenails. Use lotion that does not contain alcohol and is unscented. Do not apply lotion between your toes. Shoes and socks  Wear clean socks or stockings every day. Make sure they are not too tight. Do not wear knee-high stockings since they may decrease blood flow to your legs.  Wear shoes that fit properly and have enough cushioning. Always look in your shoes before you put them on to be sure there are no objects inside.  To break in new shoes, wear them for just a few hours a day. This prevents injuries on your feet. Wounds, scrapes, corns, and calluses  Check your feet daily for blisters, cuts, bruises, sores, and redness. If you cannot see the bottom of your feet, use a mirror or ask someone for help.  Do not cut corns or calluses or try to remove them with medicine.  If you  find a minor scrape, cut, or break in the skin on your feet, keep it and the skin around it clean and dry. You may clean these areas with mild soap and water. Do not clean the area with peroxide, alcohol, or iodine.  If you have a wound, scrape, corn, or callus on your foot, look at it several times a day to make sure it is healing and not infected. Check for: ? Redness, swelling, or pain. ? Fluid or blood. ? Warmth. ? Pus or a bad smell. General instructions  Do not cross your legs. This may decrease blood flow to your feet.  Do not use heating pads or hot water bottles on your feet. They may burn your skin. If you have lost feeling in your feet or legs, you may not know this is happening until it is too late.  Protect your feet from hot and cold by wearing shoes, such as at the beach or on hot pavement.  Schedule a complete foot exam at least once a year (annually) or more often if you have foot problems. If you have foot problems, report any cuts, sores, or bruises to your health care provider immediately. Contact a health care provider if:  You have a medical condition that increases your risk of infection and you have any cuts, sores, or bruises on your feet.  You have an injury that is not   healing.  You have redness on your legs or feet.  You feel burning or tingling in your legs or feet.  You have pain or cramps in your legs and feet.  Your legs or feet are numb.  Your feet always feel cold.  You have pain around a toenail. Get help right away if:  You have a wound, scrape, corn, or callus on your foot and: ? You have pain, swelling, or redness that gets worse. ? You have fluid or blood coming from the wound, scrape, corn, or callus. ? Your wound, scrape, corn, or callus feels warm to the touch. ? You have pus or a bad smell coming from the wound, scrape, corn, or callus. ? You have a fever. ? You have a red line going up your leg. Summary  Check your feet every day  for cuts, sores, red spots, swelling, and blisters.  Moisturize feet and legs daily.  Wear shoes that fit properly and have enough cushioning.  If you have foot problems, report any cuts, sores, or bruises to your health care provider immediately.  Schedule a complete foot exam at least once a year (annually) or more often if you have foot problems. This information is not intended to replace advice given to you by your health care provider. Make sure you discuss any questions you have with your health care provider. Document Released: 03/24/2000 Document Revised: 05/09/2017 Document Reviewed: 04/28/2016 Elsevier Interactive Patient Education  2019 [place] are small areas of thickened skin that occur on the top, sides, or tip of a toe. They contain a cone-shaped core with a point that can press on a nerve below. This causes pain.  Calluses are areas of thickened skin that can occur anywhere on the body, including the hands, fingers, palms, soles of the feet, and heels. Calluses are usually larger than corns. What are the causes? Corns and calluses are caused by rubbing (friction) or pressure, such as from shoes that are too tight or do not fit properly. What increases the risk? Corns are more likely to develop in people who have misshapen toes (toe deformities), such as hammer toes. Calluses can occur with friction to any area of the skin. They are more likely to develop in people who:  Work with their hands.  Wear shoes that fit poorly, are too tight, or are high-heeled.  Have toe deformities. What are the signs or symptoms? Symptoms of a corn or callus include:  A hard growth on the skin.  Pain or tenderness under the skin.  Redness and swelling.  Increased discomfort while wearing tight-fitting shoes, if your feet are affected. If a corn or callus becomes infected, symptoms may include:  Redness and swelling that gets worse.  Pain.  Fluid,  blood, or pus draining from the corn or callus. How is this diagnosed? Corns and calluses may be diagnosed based on your symptoms, your medical history, and a physical exam. How is this treated? Treatment for corns and calluses may include:  Removing the cause of the friction or pressure. This may involve: ? Changing your shoes. ? Wearing shoe inserts (orthotics) or other protective layers in your shoes, such as a corn pad. ? Wearing gloves.  Applying medicine to the skin (topical medicine) to help soften skin in the hardened, thickened areas.  Removing layers of dead skin with a file to reduce the size of the corn or callus.  Removing the corn or callus with a scalpel or  laser.  Taking antibiotic medicines, if your corn or callus is infected.  Having surgery, if a toe deformity is the cause. Follow these instructions at home:   Take over-the-counter and prescription medicines only as told by your health care provider.  If you were prescribed an antibiotic, take it as told by your health care provider. Do not stop taking it even if your condition starts to improve.  Wear shoes that fit well. Avoid wearing high-heeled shoes and shoes that are too tight or too loose.  Wear any padding, protective layers, gloves, or orthotics as told by your health care provider.  Soak your hands or feet and then use a file or pumice stone to soften your corn or callus. Do this as told by your health care provider.  Check your corn or callus every day for symptoms of infection. Contact a health care provider if you:  Notice that your symptoms do not improve with treatment.  Have redness or swelling that gets worse.  Notice that your corn or callus becomes painful.  Have fluid, blood, or pus coming from your corn or callus.  Have new symptoms. Summary  Corns are small areas of thickened skin that occur on the top, sides, or tip of a toe.  Calluses are areas of thickened skin that can  occur anywhere on the body, including the hands, fingers, palms, and soles of the feet. Calluses are usually larger than corns.  Corns and calluses are caused by rubbing (friction) or pressure, such as from shoes that are too tight or do not fit properly.  Treatment may include wearing any padding, protective layers, gloves, or orthotics as told by your health care provider. This information is not intended to replace advice given to you by your health care provider. Make sure you discuss any questions you have with your health care provider. Document Released: 01/01/2004 Document Revised: 02/07/2017 Document Reviewed: 02/07/2017 Elsevier Interactive Patient Education  2019 [employer].

## 2018-05-22 ENCOUNTER — Encounter: Payer: Self-pay | Admitting: Podiatry

## 2018-05-22 NOTE — Progress Notes (Signed)
Subjective: Jon Anderson is a 68 y.o. y.o. male who presents for preventative foot care today with PAD pre-ulcerative lesion submetatarsal head 5 bilaterally.  These areas have a history of breaking down.   Jon Panda, MD is his PCP.  He is wearing his diabetic shoes with custom molded insoles on today.   Current Outpatient Medications:  .  acetaminophen (TYLENOL) 500 MG tablet, Take 1,000 mg by mouth 2 (two) times daily as needed for moderate pain or headache., Disp: , Rfl:  .  aspirin EC 81 MG tablet, Take 1 tablet (81 mg total) by mouth daily., Disp: , Rfl:  .  atorvastatin (LIPITOR) 40 MG tablet, Take 1 tablet (40 mg total) by mouth daily at 6 PM., Disp: 30 tablet, Rfl: 5 .  calcium acetate (PHOSLO) 667 MG capsule, Take 3 capsules (2,001 mg total) by mouth 3 (three) times daily with meals. (Patient taking differently: Take 1,334 mg by mouth 2 (two) times daily with a meal. ), Disp: 270 capsule, Rfl: 0 .  cyclobenzaprine (FLEXERIL) 5 MG tablet, Take 1 tablet (5 mg total) by mouth 3 (three) times daily as needed for muscle spasms., Disp: 10 tablet, Rfl: 0 .  diclofenac (VOLTAREN) 75 MG EC tablet, Take 75 mg by mouth 2 (two) times daily as needed for mild pain. , Disp: , Rfl:  .  gabapentin (NEURONTIN) 100 MG capsule, Take 100 mg by mouth See admin instructions. Take 100 mg in the morning and 300 mg in the evening, Disp: , Rfl:  .  HYDROcodone-acetaminophen (NORCO/VICODIN) 5-325 MG tablet, Take 1 tablet by mouth every 4 (four) hours as needed for moderate pain ((score 4 to 6)). (Patient not taking: Reported on 04/23/2018), Disp: 30 tablet, Rfl: 0 .  multivitamin (RENA-VIT) TABS tablet, Take 1 tablet by mouth at bedtime., Disp: 30 tablet, Rfl: 0 .  nitroGLYCERIN (NITROSTAT) 0.4 MG SL tablet, Place 1 tablet (0.4 mg total) under the tongue every 5 (five) minutes as needed for chest pain., Disp: 25 tablet, Rfl: 3 .  oxyCODONE-acetaminophen (PERCOCET/ROXICET) 5-325 MG tablet, Take 1 tablet by  mouth every 4 (four) hours as needed for moderate pain or severe pain., Disp: , Rfl:  .  pantoprazole (PROTONIX) 40 MG tablet, Take 40 mg by mouth daily., Disp: , Rfl:  .  polyethylene glycol (MIRALAX / GLYCOLAX) packet, Take 17 g by mouth daily as needed for moderate constipation., Disp: 14 each, Rfl: 0 .  rOPINIRole (REQUIP) 0.5 MG tablet, Take 0.5 mg by mouth 2 (two) times daily. , Disp: , Rfl:   Allergies  Allergen Reactions  . Kiwi Extract Swelling and Other (See Comments)    Facial swelling   . Tape Other (See Comments)    Plastic tape-blistering    Objective: Vascular Examination: Capillary refill time <3 seconds x 10 digits  Dorsalis pedis pulses absent b/l  Posterior tibial pulses faintly palpable b/l  No digital hair x 10 digits  Skin temperature gradient within normal limits bilaterally  Dermatological Examination: Skin thin, shiny and atrophic b/l  Toenails 1-5 b/l show evidence of recent debridement.  Hyperkeratotic lesion noted submetatarsal head 5 bilaterally with subdermal hemorrhage.  There is also fat pad atrophy in his area there is no erythema, no edema, no drainage, no flocculence noted.  Hyperkeratotic lesion noted dorsal PIPJ  Musculoskeletal: Muscle strength 5/5 to all LE muscle groups  Neurological: Sensation diminished with 10 gram monofilament.  Vibratory sensation diminished  Assessment: 1. Pre-ulcerative calluses submetatarsal head 5 bilaterally 2. NIDDM  with peripheral arterial disease  Plan:. 1. Hyperkeratotic lesion(s) pared submetatarsal head 5 bilaterally with sterile scalpel l blade and gently smoothed with burr. 2. Patient to continue his diabetic shoe gear daily. 3. Patient to report any pedal injuries to medical professional immediately. 4. Follow up 6 weeks.  5. Patient/POA to call should there be a concern in the interim.

## 2018-05-31 ENCOUNTER — Emergency Department (HOSPITAL_COMMUNITY)
Admission: EM | Admit: 2018-05-31 | Discharge: 2018-05-31 | Disposition: A | Discharge status: Home / Self Care | Payer: Medicare Other | Attending: Emergency Medicine | Admitting: Emergency Medicine

## 2018-05-31 ENCOUNTER — Other Ambulatory Visit: Payer: Self-pay

## 2018-05-31 ENCOUNTER — Emergency Department (HOSPITAL_COMMUNITY): Payer: Medicare Other

## 2018-05-31 ENCOUNTER — Encounter (HOSPITAL_COMMUNITY): Payer: Self-pay | Admitting: Emergency Medicine

## 2018-05-31 DIAGNOSIS — Y9389 Activity, other specified: Secondary | ICD-10-CM | POA: Diagnosis not present

## 2018-05-31 DIAGNOSIS — Z87891 Personal history of nicotine dependence: Secondary | ICD-10-CM | POA: Insufficient documentation

## 2018-05-31 DIAGNOSIS — I5032 Chronic diastolic (congestive) heart failure: Secondary | ICD-10-CM | POA: Insufficient documentation

## 2018-05-31 DIAGNOSIS — Z992 Dependence on renal dialysis: Secondary | ICD-10-CM | POA: Diagnosis not present

## 2018-05-31 DIAGNOSIS — Z79899 Other long term (current) drug therapy: Secondary | ICD-10-CM | POA: Insufficient documentation

## 2018-05-31 DIAGNOSIS — Y9289 Other specified places as the place of occurrence of the external cause: Secondary | ICD-10-CM | POA: Diagnosis not present

## 2018-05-31 DIAGNOSIS — W101XXA Fall (on)(from) sidewalk curb, initial encounter: Secondary | ICD-10-CM | POA: Diagnosis not present

## 2018-05-31 DIAGNOSIS — M25512 Pain in left shoulder: Secondary | ICD-10-CM | POA: Insufficient documentation

## 2018-05-31 DIAGNOSIS — E1140 Type 2 diabetes mellitus with diabetic neuropathy, unspecified: Secondary | ICD-10-CM | POA: Diagnosis not present

## 2018-05-31 DIAGNOSIS — Y999 Unspecified external cause status: Secondary | ICD-10-CM | POA: Diagnosis not present

## 2018-05-31 DIAGNOSIS — E1122 Type 2 diabetes mellitus with diabetic chronic kidney disease: Secondary | ICD-10-CM | POA: Diagnosis not present

## 2018-05-31 DIAGNOSIS — N186 End stage renal disease: Secondary | ICD-10-CM | POA: Diagnosis not present

## 2018-05-31 DIAGNOSIS — I132 Hypertensive heart and chronic kidney disease with heart failure and with stage 5 chronic kidney disease, or end stage renal disease: Secondary | ICD-10-CM | POA: Diagnosis not present

## 2018-05-31 DIAGNOSIS — W19XXXA Unspecified fall, initial encounter: Secondary | ICD-10-CM

## 2018-05-31 MED ORDER — HYDROCODONE-ACETAMINOPHEN 5-325 MG PO TABS
1.0000 | ORAL_TABLET | Freq: Once | ORAL | Status: AC
  Administered 2018-05-31: 1 via ORAL
  Filled 2018-05-31: qty 1

## 2018-05-31 NOTE — Discharge Instructions (Addendum)
You have been seen today for shoulder pain after fall. Please read and follow all provided instructions. Return to the emergency room for worsening condition or new concerning symptoms.    1. Medications:  Continue usual home medications Take medications as prescribed. Please review all of the medicines and only take them if you do not have an allergy to them.  2. Treatment: rest, drink plenty of fluids 3. Follow Up: Please follow up with your primary doctor in 2-5 days for discussion of your diagnoses and further evaluation after today's visit; Call today to arrange your follow up.  If you do not have a primary care doctor use the resource guide provided to find one;   It is also a possibility that you have an allergic reaction to any of the medicines that you have been prescribed - Everybody reacts differently to medications and while MOST people have no trouble with most medicines, you may have a reaction such as nausea, vomiting, rash, swelling, shortness of breath. If this is the case, please stop taking the medicine immediately and contact your physician.  ?

## 2018-05-31 NOTE — ED Notes (Addendum)
Patient verbalized understanding of discharge instructions. Opportunities for questioning and answers were provided. Armband removed by staff. Patient removed from ED.   

## 2018-05-31 NOTE — ED Triage Notes (Signed)
coming from dialysis  ( got full TX ) fell on left side, c/o left rib pain , hurts to take a DEEP BREATH , LEFT SHOULDER PAIN AND PAIN IN LEFT ARM, DID NOT HIT HEAD, NO LOC , pt is on plaviix , fistula left arm

## 2018-05-31 NOTE — ED Provider Notes (Signed)
[facility] EMERGENCY DEPARTMENT Provider Note   CSN: [id number] Arrival date & time: 05/31/18  1209    History   Chief Complaint Chief Complaint  Patient presents with  . Fall    HPI Jon Anderson is a 68 y.o. male with a history of CAD, ESRD on dialysis M/W/F, hypertension, diabetes type 2 presenting to emergency department today with chief complaint of fall. Patient is on Plavix.  Patient reports ground level fall on his way to dialysis this morning.  He was walking with his cane and holding a duffel bag he tripped on the curb causing him to fall and land on his left side.  He hit his left shoulder and left knee on the sidewalk.  Denies hitting his head,  LOC.  Patient reports the fall was unwitnessed.  Patient was able to finish his dialysis today.  He reports constant pain in his left shoulder that he describes as an ache, rating it 7 out of 10 in severity.  The pain is worse with movement and pain does not radiate.  Patient denies history of prior injury to left shoulder. Pt is reporting associated pain with inspiration after the fall. He describes the pain as sharp, it does not radiate.  Patient also has pain in his left knee.  He describes that also as an ache.  He rates his knee 1 out of 10 in severity.  Pt took Tylenol for his pain prior to arrival and reports minimal relief.       Past Medical History:  Diagnosis Date  . Anemia of chronic disease   . CAD (coronary artery disease)    a.  Myoview 4/11: EF 53%, no scar or ischemia   c. MV 2012 Nl perfusion, apical thinning.  No ischemia or scar.  EF 49%, appears greater by visual estimate.;  d.  Dob stress echo 12/13:  Negative Dob stress echo. There is no evidence of ischemia.  The LVF is normal. b. Normal cors 2016.  . Carotid stenosis    a. <40% RICA, >98% LICA by duplex 04/1912  . Chronic chest pain    occ  . ESRD (end stage renal disease) on dialysis ([place])    M-W-F  . GERD (gastroesophageal  reflux disease)   . HNP (herniated nucleus pulposus), lumbar   . HTN (hypertension)    echo 3/10: EF 60%, LAE  . Hyperlipidemia   . Nephrolithiasis    "passed them all"  . Peripheral arterial disease ([place])    a. s/p PTCA to L SFA.  Marland Kitchen Pneumonia   . Restless legs   . Sleep apnea    no cpap, needs to reschedule appointment to set up aquiring cpap  . Snores    a. presumed OSA, pt has refused sleep eval in past.  . Type II diabetes mellitus ([place])    no longer on medications, checks blood glucose at home    Patient Active Problem List   Diagnosis Date Noted  . DM neuropathy with neurologic complication ([place]) 78/29/5621  . GERD (gastroesophageal reflux disease) 02/28/2018  . Nephrolithiasis 02/28/2018  . Sciatic leg pain 02/28/2018  . Snores 02/28/2018  . Orthopnea 02/23/2018  . Elevated troponin 02/23/2018  . HNP (herniated nucleus pulposus), lumbar 07/03/2017  . Chest pain in adult 04/27/2017  . Restless leg syndrome, uncontrolled 04/27/2017  . 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 Anemia of chronic disease 03/17/2017  . Severe uncontrolled hypertension 03/17/2017  . ESRD (end stage renal  disease) on dialysis ([place]) 03/17/2017  . Hyperlipidemia 03/17/2017  . Chronic chest pain 03/17/2017  . Type II diabetes mellitus ([place]) 03/17/2017  . Acute on chronic diastolic heart failure ([place]) 03/17/2017  . Acute heart failure ([place]) 03/17/2017  . Hemodialysis status ([place]) 01/25/2017  . Lumbar radiculopathy 01/16/2017  . Pre-transplant evaluation for kidney transplant 06/15/2016  . Increased frequency of urination 12/17/2015  . Nocturia 12/17/2015  . Chest pain, non-cardiac 10/09/2015  . Acute on chronic renal failure ([place]) 06/16/2014  . Renal failure (ARF), acute on chronic (HCC) 06/16/2014  . Shoulder pain, left 12/15/2013  . Chest pain 12/15/2013  . Claudication ([place]) 12/11/2013  . PVD (peripheral vascular disease) ([place]) 12/11/2013  . Peripheral arterial disease ([place])  09/30/2013  . Carotid artery disease ([place]) 09/30/2013  . Acute chest pain 11/15/2012  . Left-sided chest wall pain 03/19/2012  . Bruit 09/15/2010  . CAD (coronary artery disease) nonobstructive per cath 2012   . Precordial chest pain 07/26/2009  . DM (diabetes mellitus) ([place]) 07/22/2009  . Hyperlipidemia LDL goal <70 07/22/2009  . Hypertension 07/22/2009    Past Surgical History:  Procedure Laterality Date  . ANGIOPLASTY / STENTING FEMORAL Left 12/11/2013   dr berry  . AV FISTULA PLACEMENT Left 03/19/2014   Procedure: CREATION OF ARTERIOVENOUS (AV) FISTULA  LEFT UPPER ARM;  Surgeon: Mal Misty, MD;  Location: [facility];  Service: Vascular;  Laterality: Left;  . CARDIAC CATHETERIZATION  2001 and 2010   . COLONOSCOPY W/ BIOPSIES AND POLYPECTOMY    . COLONOSCOPY WITH PROPOFOL N/A 08/01/2016   Procedure: COLONOSCOPY WITH PROPOFOL;  Surgeon: Carol Ada, MD;  Location: [facility];  Service: Endoscopy;  Laterality: N/A;  . CORONARY ARTERY BYPASS GRAFT  2018  . ESOPHAGOGASTRODUODENOSCOPY (EGD) WITH PROPOFOL N/A 08/01/2016   Procedure: ESOPHAGOGASTRODUODENOSCOPY (EGD) WITH PROPOFOL;  Surgeon: Carol Ada, MD;  Location: [facility];  Service: Endoscopy;  Laterality: N/A;  . FOOT FRACTURE SURGERY Right    ligament repair  . FRACTURE SURGERY    . INGUINAL HERNIA REPAIR Left   . LEFT HEART CATH AND CORS/GRAFTS ANGIOGRAPHY N/A 04/27/2017   Procedure: LEFT HEART CATH AND CORS/GRAFTS ANGIOGRAPHY;  Surgeon: Leonie Man, MD;  Location: [facility];  Service: Cardiovascular;  Laterality: N/A;  . LEFT HEART CATHETERIZATION WITH CORONARY ANGIOGRAM N/A 06/22/2014   Procedure: LEFT HEART CATHETERIZATION WITH CORONARY ANGIOGRAM;  Surgeon: Troy Sine, MD;  Location: [facility] C[facility] LAB;  Service: Cardiovascular;  Laterality: N/A;  . LOWER EXTREMITY ANGIOGRAM Left 12/11/2013   Procedure: LOWER EXTREMITY ANGIOGRAM;  Surgeon: Lorretta Harp, MD;  Location: [facility] CATH LAB;  Service: Cardiovascular;   Laterality: Left;  . LUMBAR LAMINECTOMY/DECOMPRESSION MICRODISCECTOMY Right 07/03/2017   Procedure: MICRODISCECTOMY LUMBAR FIVE - SACRAL ONE RIGHT;  Surgeon: Consuella Lose, MD;  Location: [place];  Service: Neurosurgery;  Laterality: Right;  . LUMBAR LAMINECTOMY/DECOMPRESSION MICRODISCECTOMY Right 10/19/2017   Procedure: MICRODISCECTOMY LUMBAR FIVE- SACRAL 1 ONE ;  Surgeon: Consuella Lose, MD;  Location: [place];  Service: Neurosurgery;  Laterality: Right;  . TONSILLECTOMY AND ADENOIDECTOMY    . WISDOM TOOTH EXTRACTION          Home Medications    Prior to Admission medications   Medication Sig Start Date End Date Taking? Authorizing Provider  acetaminophen (TYLENOL) 500 MG tablet Take 1,000 mg by mouth 2 (two) times daily as needed for moderate pain or headache.    [provider]  aspirin EC 81 MG tablet Take 1 tablet (81 mg total)  by mouth daily. 05/28/12   Richardson Dopp T, PA-C  atorvastatin (LIPITOR) 40 MG tablet Take 1 tablet (40 mg total) by mouth daily at 6 PM. 12/13/13   Brett Canales, PA-C  calcium acetate (PHOSLO) 667 MG capsule Take 3 capsules (2,001 mg total) by mouth 3 (three) times daily with meals. Patient taking differently: Take 1,334 mg by mouth 2 (two) times daily with a meal.  10/09/15   Short, Noah Delaine, MD  cyclobenzaprine (FLEXERIL) 5 MG tablet Take 1 tablet (5 mg total) by mouth 3 (three) times daily as needed for muscle spasms. 02/17/18   Drenda Freeze, MD  diclofenac (VOLTAREN) 75 MG EC tablet Take 75 mg by mouth 2 (two) times daily as needed for mild pain.     [provider]  gabapentin (NEURONTIN) 100 MG capsule Take 100 mg by mouth See admin instructions. Take 100 mg in the morning and 300 mg in the evening    [provider]  HYDROcodone-acetaminophen (NORCO/VICODIN) 5-325 MG tablet Take 1 tablet by mouth every 4 (four) hours as needed for moderate pain ((score 4 to 6)). Patient not taking: Reported on 04/23/2018 03/17/18   Eleonore Chiquito, NP  multivitamin (RENA-VIT) TABS tablet Take 1 tablet by mouth at bedtime. 06/23/14   Domenic Polite, MD  nitroGLYCERIN (NITROSTAT) 0.4 MG SL tablet Place 1 tablet (0.4 mg total) under the tongue every 5 (five) minutes as needed for chest pain. 03/20/12   Barrett, Evelene Croon, PA-C  oxyCODONE-acetaminophen (PERCOCET/ROXICET) 5-325 MG tablet Take 1 tablet by mouth every 4 (four) hours as needed for moderate pain or severe pain.    [provider]  pantoprazole (PROTONIX) 40 MG tablet Take 40 mg by mouth daily.    [provider]  polyethylene glycol (MIRALAX / GLYCOLAX) packet Take 17 g by mouth daily as needed for moderate constipation. 03/22/17   Aline August, MD  rOPINIRole (REQUIP) 0.5 MG tablet Take 0.5 mg by mouth 2 (two) times daily.     [provider]    Family History Family History  Problem Relation Age of Onset  . Heart attack Sister        died @ 84  . Cancer Mother        died @ 72; unknown type  . Diabetes Brother        deceased  . Cirrhosis Father        alcohol related  . Diabetes Father   . Esophageal cancer Neg Hx   . Colon cancer Neg Hx   . Pancreatic cancer Neg Hx   . Stomach cancer Neg Hx     Social History Social History   Tobacco Use  . Smoking status: Former Smoker    Packs/day: 1.00    Years: 2.00    Pack years: 2.00    Types: Cigarettes  . Smokeless tobacco: Never Used  . Tobacco comment: quit smoking 40 yrs ago  Substance Use Topics  . Alcohol use: No    Alcohol/week: 0.0 standard drinks  . Drug use: No     Allergies   Kiwi extract and Tape   Review of Systems Review of Systems  HENT: Negative for facial swelling.   Respiratory: Negative for shortness of breath.   Cardiovascular: Positive for chest pain.  Musculoskeletal: Positive for arthralgias. Negative for back pain and neck pain.  Skin: Positive for wound. Negative for rash.  Neurological: Negative for dizziness, numbness and headaches.   All other systems reviewed and  are negative.    Physical Exam Updated Vital Signs BP (!) 165/94   Pulse 67   Temp 97.7 F (36.5 C) (Oral)   Resp 15   Ht 5\' 11"  (1.803 m)   Wt 73.5 kg   SpO2 96%   BMI 22.59 kg/m   Physical Exam Vitals signs and nursing note reviewed.  Constitutional:      Appearance: He is not ill-appearing or toxic-appearing.  HENT:     Head: Normocephalic and atraumatic.     Nose: Nose normal.     Mouth/Throat:     Mouth: Mucous membranes are moist.     Pharynx: Oropharynx is clear.  Eyes:     Conjunctiva/sclera: Conjunctivae normal.  Neck:     Musculoskeletal: Normal range of motion.     Comments: Full ROM intact without spinous process TTP. No bony stepoffs or deformities, No paraspinous muscle TTP or muscle spasms. No rigidity. No bruising, erythema, or swelling. Cardiovascular:     Rate and Rhythm: Normal rate and regular rhythm.     Pulses: Normal pulses.     Heart sounds: Normal heart sounds.  Pulmonary:     Effort: Pulmonary effort is normal.     Breath sounds: Normal breath sounds.  Abdominal:     General: There is no distension.     Palpations: Abdomen is soft.     Tenderness: There is no abdominal tenderness. There is no guarding or rebound.  Musculoskeletal: Normal range of motion.     Comments: Left shoulder with tenderness to palpation. Full ROM. *No swelling, erythema or ecchymosis present. No step-off, crepitus, or deformity appreciated. 5/5 muscle strength of bilateral UE. 2+ radial pulse, sensation intact and all compartments soft. Small abrasion to left shoulder without bleeding.  Left knee and ankle non tender to palpation and with full ROM. No deformity or swelling. Neurovascularly intact distally. Compartments soft above and below affected joint. Left DP pulse 2+.   Skin:    General: Skin is warm and dry.     Capillary Refill: Capillary refill takes less than 2 seconds.  Neurological:     Mental Status: He is alert. Mental  status is at baseline.     Motor: No weakness.  Psychiatric:        Behavior: Behavior normal.      ED Treatments / Results  Labs (all labs ordered are listed, but only abnormal results are displayed) Labs Reviewed - No data to display  EKG None  Radiology Dg Chest 2 View  Result Date: 05/31/2018 CLINICAL DATA:  [place].  Left rib pain. EXAM: CHEST - 2 VIEW COMPARISON:  Chest x-ray 04/22/2018 FINDINGS: The cardiac silhouette, mediastinal and hilar contours are within normal limits and stable. There is mild tortuosity and calcification of the thoracic aorta. The lungs are clear of an acute process. No pulmonary contusion, pleural effusion or pneumothorax. The bony thorax is intact. No definite rib fractures are identified. The sternum appears intact and the thoracic vertebral bodies are normally aligned. No definite compression fractures. IMPRESSION: 1. No acute cardiopulmonary findings. 2. Intact bony thorax. Electronically Signed   By: Marijo Sanes M.D.   On: 05/31/2018 15:41   Dg Shoulder Left  Result Date: 05/31/2018 CLINICAL DATA:  [place].  Left shoulder pain. EXAM: LEFT SHOULDER - 2+ VIEW COMPARISON:  Radiographs 12/15/2013 FINDINGS: The joint spaces are maintained. No acute bony findings or bone lesion. No abnormal soft tissue calcifications. The visualized lung is clear and the visualized ribs are intact. IMPRESSION:  No fracture or dislocation. Electronically Signed   By: Marijo Sanes M.D.   On: 05/31/2018 15:43    Procedures Procedures (including critical care time)  Medications Ordered in ED Medications  HYDROcodone-acetaminophen (NORCO/VICODIN) 5-325 MG per tablet 1 tablet (1 tablet Oral Given 05/31/18 1412)     Initial Impression / Assessment and Plan / ED Course  I have reviewed the triage vital signs and the nursing notes.  Pertinent labs & imaging results that were available during my care of the patient were reviewed by me and considered in my medical decision making  (see chart for details).    Pt presents after mechanical fall. He landed on his left side, hitting his shoulder and left knee. Pt is on plavix. He denies hitting his head. On exam he does not have midline tenderness, his head is atraumatic and non tender to palpation. I discussed further evaluation of his head with a CT scan given he is on blood thinners. He denies hitting his head and does not want the scan, I think that is a reasonable decision. His EKG today is unchanged from prior. DG chest and left shoulder are negative. Pt's pleuritic pain and left shoulder pain improved after po Norco. Pt ambulated with assistance. He usually walks with a cane, but does not have it here with him.   Discussed strict ED return precautions. Pt verbalized understanding of and is in agreement with this plan. Pt stable for discharge home at this time. Pt case discussed with Dr. Johnney Killian who agrees with my plan.          Final Clinical Impressions(s) / ED Diagnoses   Final diagnoses:  Fall, initial encounter  Acute pain of left shoulder    ED Discharge Orders    None       Flint Melter 06/01/18 0115    Charlesetta Shanks, MD 06/01/18 [phone]

## 2018-06-14 ENCOUNTER — Emergency Department (HOSPITAL_COMMUNITY)
Admission: EM | Admit: 2018-06-14 | Discharge: 2018-06-14 | Disposition: A | Discharge status: Home / Self Care | Payer: Medicare Other | Attending: Emergency Medicine | Admitting: Emergency Medicine

## 2018-06-14 ENCOUNTER — Emergency Department (HOSPITAL_COMMUNITY): Payer: Medicare Other

## 2018-06-14 ENCOUNTER — Other Ambulatory Visit: Payer: Self-pay

## 2018-06-14 ENCOUNTER — Encounter (HOSPITAL_COMMUNITY): Payer: Self-pay | Admitting: Emergency Medicine

## 2018-06-14 DIAGNOSIS — I2510 Atherosclerotic heart disease of native coronary artery without angina pectoris: Secondary | ICD-10-CM | POA: Diagnosis not present

## 2018-06-14 DIAGNOSIS — Y929 Unspecified place or not applicable: Secondary | ICD-10-CM | POA: Diagnosis not present

## 2018-06-14 DIAGNOSIS — Z951 Presence of aortocoronary bypass graft: Secondary | ICD-10-CM | POA: Diagnosis not present

## 2018-06-14 DIAGNOSIS — Z7982 Long term (current) use of aspirin: Secondary | ICD-10-CM | POA: Diagnosis not present

## 2018-06-14 DIAGNOSIS — E1122 Type 2 diabetes mellitus with diabetic chronic kidney disease: Secondary | ICD-10-CM | POA: Insufficient documentation

## 2018-06-14 DIAGNOSIS — Y9389 Activity, other specified: Secondary | ICD-10-CM | POA: Insufficient documentation

## 2018-06-14 DIAGNOSIS — I132 Hypertensive heart and chronic kidney disease with heart failure and with stage 5 chronic kidney disease, or end stage renal disease: Secondary | ICD-10-CM | POA: Insufficient documentation

## 2018-06-14 DIAGNOSIS — W010XXA Fall on same level from slipping, tripping and stumbling without subsequent striking against object, initial encounter: Secondary | ICD-10-CM | POA: Insufficient documentation

## 2018-06-14 DIAGNOSIS — Z79899 Other long term (current) drug therapy: Secondary | ICD-10-CM | POA: Insufficient documentation

## 2018-06-14 DIAGNOSIS — Z87891 Personal history of nicotine dependence: Secondary | ICD-10-CM | POA: Insufficient documentation

## 2018-06-14 DIAGNOSIS — S52502A Unspecified fracture of the lower end of left radius, initial encounter for closed fracture: Secondary | ICD-10-CM | POA: Diagnosis not present

## 2018-06-14 DIAGNOSIS — Y998 Other external cause status: Secondary | ICD-10-CM | POA: Diagnosis not present

## 2018-06-14 DIAGNOSIS — Z992 Dependence on renal dialysis: Secondary | ICD-10-CM | POA: Insufficient documentation

## 2018-06-14 DIAGNOSIS — M545 Low back pain, unspecified: Secondary | ICD-10-CM

## 2018-06-14 DIAGNOSIS — N186 End stage renal disease: Secondary | ICD-10-CM | POA: Diagnosis not present

## 2018-06-14 DIAGNOSIS — S6992XA Unspecified injury of left wrist, hand and finger(s), initial encounter: Secondary | ICD-10-CM | POA: Diagnosis present

## 2018-06-14 DIAGNOSIS — I5030 Unspecified diastolic (congestive) heart failure: Secondary | ICD-10-CM | POA: Diagnosis not present

## 2018-06-14 DIAGNOSIS — W19XXXA Unspecified fall, initial encounter: Secondary | ICD-10-CM

## 2018-06-14 MED ORDER — HYDROCODONE-ACETAMINOPHEN 5-325 MG PO TABS: 1.0000 | ORAL_TABLET | ORAL | 0 refills | Status: DC | PRN

## 2018-06-14 NOTE — ED Triage Notes (Signed)
Pt reports he had a mechanical fall at 0630 this date. Pt reports lower back pain left wrist pain from a fall. Pt reports recent lower back surgery.

## 2018-06-14 NOTE — ED Notes (Signed)
Patient verbalized understanding of discharge instructions. Opportunities for questioning and answers were provided. Armband removed by staff. Patient removed from ED.   

## 2018-06-14 NOTE — Discharge Instructions (Addendum)
Home to elevate, apply ice for 30 minutes at a time. Take Norco as needed as prescribed for pain. This medication contains Tylenol, take in place of/instead of your Tylenol dose.   Dr. Biagio Borg office will call you to schedule follow up appointment.

## 2018-06-14 NOTE — Progress Notes (Signed)
Orthopedic Tech Progress Note Patient Details:  Jon Anderson 1950/09/07 [id number]  Ortho Devices Type of Ortho Device: Short arm splint Ortho Device/Splint Location: Left Arm  Ortho Device/Splint Interventions: Ordered, Application, Adjustment   Post Interventions Instructions Provided: Adjustment of device, Care of device   [name] [name] 06/14/2018, 3:50 PM

## 2018-06-14 NOTE — ED Provider Notes (Signed)
[facility] EMERGENCY DEPARTMENT Provider Note   CSN: [id number] Arrival date & time: 06/14/18  1247    History   Chief Complaint Chief Complaint  Patient presents with  . Fall  . Wrist Pain  . Back Pain    HPI Jon Anderson is a 68 y.o. male.     69 year old male with history of diabetes on dialysis, recent lumbar fusion in November 2019, presents with injuries from a fall.  Patient states that at 630 this morning he was changing a tire on his car when he tripped and fell backwards as he was standing up, landing on outstretched left hand.  Patient reports pain in his left wrist and right lower back.  Patient denies hitting his head or loss of consciousness, denies feeling weak or dizzy.  Patient was able to get up and get to dialysis today and came this afternoon after completing his dialysis. Patient is right hand dominant, walks with a cane (in right hand). No other complaints or concerns.      Past Medical History:  Diagnosis Date  . Anemia of chronic disease   . CAD (coronary artery disease)    a.  Myoview 4/11: EF 53%, no scar or ischemia   c. MV 2012 Nl perfusion, apical thinning.  No ischemia or scar.  EF 49%, appears greater by visual estimate.;  d.  Dob stress echo 12/13:  Negative Dob stress echo. There is no evidence of ischemia.  The LVF is normal. b. Normal cors 2016.  . Carotid stenosis    a. <35% RICA, >57% LICA by duplex 06/2200  . Chronic chest pain    occ  . ESRD (end stage renal disease) on dialysis ([place])    M-W-F  . GERD (gastroesophageal reflux disease)   . HNP (herniated nucleus pulposus), lumbar   . HTN (hypertension)    echo 3/10: EF 60%, LAE  . Hyperlipidemia   . Nephrolithiasis    "passed them all"  . Peripheral arterial disease ([place])    a. s/p PTCA to L SFA.  Marland Kitchen Pneumonia   . Restless legs   . Sleep apnea    no cpap, needs to reschedule appointment to set up aquiring cpap  . Snores    a. presumed OSA, pt has refused  sleep eval in past.  . Type II diabetes mellitus ([place])    no longer on medications, checks blood glucose at home    Patient Active Problem List   Diagnosis Date Noted  . DM neuropathy with neurologic complication ([place]) 54/27/0623  . GERD (gastroesophageal reflux disease) 02/28/2018  . Nephrolithiasis 02/28/2018  . Sciatic leg pain 02/28/2018  . Snores 02/28/2018  . Orthopnea 02/23/2018  . Elevated troponin 02/23/2018  . HNP (herniated nucleus pulposus), lumbar 07/03/2017  . Chest pain in adult 04/27/2017  . Restless leg syndrome, uncontrolled 04/27/2017  . 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 Anemia of chronic disease 03/17/2017  . Severe uncontrolled hypertension 03/17/2017  . ESRD (end stage renal disease) on dialysis ([place]) 03/17/2017  . Hyperlipidemia 03/17/2017  . Chronic chest pain 03/17/2017  . Type II diabetes mellitus ([place]) 03/17/2017  . Acute on chronic diastolic heart failure ([place]) 03/17/2017  . Acute heart failure ([place]) 03/17/2017  . Hemodialysis status ([place]) 01/25/2017  . Lumbar radiculopathy 01/16/2017  . Pre-transplant evaluation for kidney transplant 06/15/2016  . Increased frequency of urination 12/17/2015  . Nocturia 12/17/2015  . Chest pain, non-cardiac 10/09/2015  . Acute on chronic renal  failure ([place]) 06/16/2014  . Renal failure (ARF), acute on chronic (HCC) 06/16/2014  . Shoulder pain, left 12/15/2013  . Chest pain 12/15/2013  . Claudication ([place]) 12/11/2013  . PVD (peripheral vascular disease) ([place]) 12/11/2013  . Peripheral arterial disease ([place]) 09/30/2013  . Carotid artery disease ([place]) 09/30/2013  . Acute chest pain 11/15/2012  . Left-sided chest wall pain 03/19/2012  . Bruit 09/15/2010  . CAD (coronary artery disease) nonobstructive per cath 2012   . Precordial chest pain 07/26/2009  . DM (diabetes mellitus) ([place]) 07/22/2009  . Hyperlipidemia LDL goal <70 07/22/2009  . Hypertension 07/22/2009    Past Surgical History:  Procedure  Laterality Date  . ANGIOPLASTY / STENTING FEMORAL Left 12/11/2013   dr berry  . AV FISTULA PLACEMENT Left 03/19/2014   Procedure: CREATION OF ARTERIOVENOUS (AV) FISTULA  LEFT UPPER ARM;  Surgeon: Mal Misty, MD;  Location: [facility];  Service: Vascular;  Laterality: Left;  . CARDIAC CATHETERIZATION  2001 and 2010   . COLONOSCOPY W/ BIOPSIES AND POLYPECTOMY    . COLONOSCOPY WITH PROPOFOL N/A 08/01/2016   Procedure: COLONOSCOPY WITH PROPOFOL;  Surgeon: Carol Ada, MD;  Location: [facility];  Service: Endoscopy;  Laterality: N/A;  . CORONARY ARTERY BYPASS GRAFT  2018  . ESOPHAGOGASTRODUODENOSCOPY (EGD) WITH PROPOFOL N/A 08/01/2016   Procedure: ESOPHAGOGASTRODUODENOSCOPY (EGD) WITH PROPOFOL;  Surgeon: Carol Ada, MD;  Location: [facility];  Service: Endoscopy;  Laterality: N/A;  . FOOT FRACTURE SURGERY Right    ligament repair  . FRACTURE SURGERY    . INGUINAL HERNIA REPAIR Left   . LEFT HEART CATH AND CORS/GRAFTS ANGIOGRAPHY N/A 04/27/2017   Procedure: LEFT HEART CATH AND CORS/GRAFTS ANGIOGRAPHY;  Surgeon: Leonie Man, MD;  Location: [facility];  Service: Cardiovascular;  Laterality: N/A;  . LEFT HEART CATHETERIZATION WITH CORONARY ANGIOGRAM N/A 06/22/2014   Procedure: LEFT HEART CATHETERIZATION WITH CORONARY ANGIOGRAM;  Surgeon: Troy Sine, MD;  Location: [facility] CATH LAB;  Service: Cardiovascular;  Laterality: N/A;  . LOWER EXTREMITY ANGIOGRAM Left 12/11/2013   Procedure: LOWER EXTREMITY ANGIOGRAM;  Surgeon: Lorretta Harp, MD;  Location: [facility] CATH LAB;  Service: Cardiovascular;  Laterality: Left;  . LUMBAR LAMINECTOMY/DECOMPRESSION MICRODISCECTOMY Right 07/03/2017   Procedure: MICRODISCECTOMY LUMBAR FIVE - SACRAL ONE RIGHT;  Surgeon: Consuella Lose, MD;  Location: [facility];  Service: Neurosurgery;  Laterality: Right;  . LUMBAR LAMINECTOMY/DECOMPRESSION MICRODISCECTOMY Right 10/19/2017   Procedure: MICRODISCECTOMY LUMBAR FIVE- SACRAL 1 ONE ;  Surgeon: Consuella Lose, MD;   Location: [facility];  Service: Neurosurgery;  Laterality: Right;  . TONSILLECTOMY AND ADENOIDECTOMY    . WISDOM TOOTH EXTRACTION          Home Medications    Prior to Admission medications   Medication Sig Start Date End Date Taking? Authorizing Provider  acetaminophen (TYLENOL) 500 MG tablet Take 1,000 mg by mouth 2 (two) times daily as needed for moderate pain or headache.    [provider]  aspirin EC 81 MG tablet Take 1 tablet (81 mg total) by mouth daily. 05/28/12   Richardson Dopp T, PA-C  atorvastatin (LIPITOR) 40 MG tablet Take 1 tablet (40 mg total) by mouth daily at 6 PM. 12/13/13   Brett Canales, PA-C  calcium acetate (PHOSLO) 667 MG capsule Take 3 capsules (2,001 mg total) by mouth 3 (three) times daily with meals. Patient taking differently: Take 1,334 mg by mouth 2 (two) times daily with a meal.  10/09/15   Janece Canterbury, MD  cyclobenzaprine (FLEXERIL) 5  MG tablet Take 1 tablet (5 mg total) by mouth 3 (three) times daily as needed for muscle spasms. 02/17/18   Drenda Freeze, MD  diclofenac (VOLTAREN) 75 MG EC tablet Take 75 mg by mouth 2 (two) times daily as needed for mild pain.     [provider]  gabapentin (NEURONTIN) 100 MG capsule Take 100 mg by mouth See admin instructions. Take 100 mg in the morning and 300 mg in the evening    [provider]  HYDROcodone-acetaminophen (NORCO/VICODIN) 5-325 MG tablet Take 1 tablet by mouth every 4 (four) hours as needed. 06/14/18   Tacy Learn, PA-C  multivitamin (RENA-VIT) TABS tablet Take 1 tablet by mouth at bedtime. 06/23/14   Domenic Polite, MD  nitroGLYCERIN (NITROSTAT) 0.4 MG SL tablet Place 1 tablet (0.4 mg total) under the tongue every 5 (five) minutes as needed for chest pain. 03/20/12   Barrett, Evelene Croon, PA-C  oxyCODONE-acetaminophen (PERCOCET/ROXICET) 5-325 MG tablet Take 1 tablet by mouth every 4 (four) hours as needed for moderate pain or severe pain.    [provider]  pantoprazole  (PROTONIX) 40 MG tablet Take 40 mg by mouth daily.    [provider]  polyethylene glycol (MIRALAX / GLYCOLAX) packet Take 17 g by mouth daily as needed for moderate constipation. 03/22/17   Aline August, MD  rOPINIRole (REQUIP) 0.5 MG tablet Take 0.5 mg by mouth 2 (two) times daily.     [provider]    Family History Family History  Problem Relation Age of Onset  . Heart attack Sister        died @ 39  . Cancer Mother        died @ 26; unknown type  . Diabetes Brother        deceased  . Cirrhosis Father        alcohol related  . Diabetes Father   . Esophageal cancer Neg Hx   . Colon cancer Neg Hx   . Pancreatic cancer Neg Hx   . Stomach cancer Neg Hx     Social History Social History   Tobacco Use  . Smoking status: Former Smoker    Packs/day: 1.00    Years: 2.00    Pack years: 2.00    Types: Cigarettes  . Smokeless tobacco: Never Used  . Tobacco comment: quit smoking 40 yrs ago  Substance Use Topics  . Alcohol use: No    Alcohol/week: 0.0 standard drinks  . Drug use: No     Allergies   Kiwi extract and Tape   Review of Systems Review of Systems  Constitutional: Negative for fever.  Musculoskeletal: Positive for arthralgias, back pain and joint swelling. Negative for neck pain and neck stiffness.  Skin: Negative for rash and wound.  Allergic/Immunologic: Negative for immunocompromised state.  Neurological: Negative for dizziness and weakness.  Psychiatric/Behavioral: Negative for confusion.  All other systems reviewed and are negative.    Physical Exam Updated Vital Signs BP 118/75 (BP Location: Right Arm)   Pulse 82   Temp 98.7 F (37.1 C) (Oral)   Resp 20   Ht 5\' 11"  (1.803 m)   Wt 72.6 kg   SpO2 99%   BMI 22.32 kg/m   Physical Exam Vitals signs and nursing note reviewed.  Constitutional:      General: He is not in acute distress.    Appearance: He is well-developed. He is not diaphoretic.  HENT:     Head:  Normocephalic and  atraumatic.  Neck:     Musculoskeletal: Neck supple. No muscular tenderness.  Cardiovascular:     Pulses: Normal pulses.  Pulmonary:     Effort: Pulmonary effort is normal.  Musculoskeletal:        General: Swelling, tenderness and signs of injury present.     Left elbow: Normal.     Right wrist: Normal.     Left wrist: He exhibits decreased range of motion, tenderness, bony tenderness and swelling. He exhibits no laceration.     Cervical back: He exhibits no tenderness.     Thoracic back: He exhibits no tenderness.     Lumbar back: He exhibits tenderness. He exhibits no bony tenderness.       Back:       Arms:  Skin:    General: Skin is warm and dry.     Capillary Refill: Capillary refill takes less than 2 seconds.     Findings: No erythema or rash.  Neurological:     Mental Status: He is alert and oriented to person, place, and time.  Psychiatric:        Behavior: Behavior normal.      ED Treatments / Results  Labs (all labs ordered are listed, but only abnormal results are displayed) Labs Reviewed - No data to display  EKG None  Radiology Dg Lumbar Spine Complete  Result Date: 06/14/2018 CLINICAL DATA:  Pt fell this morning while changing a tire - having right lower back pain since fall - hx of lumbar fusion back in December of 2019 EXAM: [facility] 4+ VIEW COMPARISON:  CT, 04/23/2018 FINDINGS: No fracture or acute finding. Bilateral pedicle screws and interconnecting rods fuse L4, L5 and S1. The orthopedic hardware appears well seated and well-positioned and is unchanged. There are intervertebral cages well centered at the L4-L5 and L5-S1 levels, also stable. Moderate loss of disc height with mild endplate sclerosis at I2-L7. Mild curvature, convex the right, apex at L3. Bone cyst in the right ilium projecting over the right SI joint. These findings are stable from the prior CT. Status post right inguinal herniorrhaphy with placement of  mass, stable from the prior CT. Aortoiliac atherosclerotic calcifications. IMPRESSION: 1. No fracture or acute finding. 2. Status post posterior lumbar spine fusion, L 4 through S1, stable from the prior CT. Orthopedic hardware is well-seated. 3. Degenerative changes and mild dextroscoliosis as described. Electronically Signed   By: Lajean Manes M.D.   On: 06/14/2018 13:52   Dg Wrist Complete Left  Result Date: 06/14/2018 CLINICAL DATA:  Pt fell this morning while changing a tire - caught self with left arm - having left wrist pain and swelling post fall EXAM: LEFT WRIST - COMPLETE 3+ VIEW COMPARISON:  None. FINDINGS: There is a subtle fracture of the distal radial metaphysis, best seen on the lateral view. This leads to slight dorsal impaction with minimal dorsal angulation of the distal radial articular surface of 5 degrees. No other fractures. The joints are normally spaced and aligned. Mild dorsal soft tissue swelling. IMPRESSION: Nondisplaced transverse fracture across the distal left radial metaphysis. No dislocation. Electronically Signed   By: Lajean Manes M.D.   On: 06/14/2018 13:53    Procedures Procedures (including critical care time)  Medications Ordered in ED Medications - No data to display   Initial Impression / Assessment and Plan / ED Course  I have reviewed the triage vital signs and the nursing notes.  Pertinent labs & imaging results that were  available during my care of the patient were reviewed by me and considered in my medical decision making (see chart for details).  Clinical Course as of Jun 13 1448  Fri Mar 06, 118  465 68 year old male on dialysis Monday Wednesday Friday, reports taking a blood thinner, presents with left wrist pain and right low back pain after fall today.  Patient states that he was changing a tire and when he stood up he stumbled backwards resulting in fall on outstretched hand.  Patient denies any his head or loss of consciousness.  Patient  went to dialysis today before coming to the ER.  Is right-hand dominant, no other injuries, complaints or concerns.  Exam patient has right lower back tenderness, no midline or bony tenderness, x-ray does not show any acute bony changes.  Exam of the left wrist with diffuse left wrist tenderness, mild swelling.  Neurovascular intact, no open wounds, strong radial pulse.  X-ray of wrist shows nondisplaced transverse fracture across the distal left radial metaphysis. Case discussed with Hilbert Odor, PA-C, on-call with orthopedics, agrees with plan for volar splint with plan to follow-up with Dr. Grandville Silos, office to call and schedule an appointment with patient.  Patient given prescription for Norco to take for his pain.  Recommend ice and elevate to help with pain and swelling.   [LM]    Clinical Course User Index [LM] Tacy Learn, PA-C   Final Clinical Impressions(s) / ED Diagnoses   Final diagnoses:  Fall, initial encounter  Acute right-sided low back pain without sciatica  Closed fracture of distal end of left radius, unspecified fracture morphology, initial encounter    ED Discharge Orders         Ordered    HYDROcodone-acetaminophen (NORCO/VICODIN) 5-325 MG tablet  Every 4 hours PRN     06/14/18 1414           Tacy Learn, PA-C 06/14/18 1450    Quintella Reichert, MD 06/14/18 1921

## 2018-06-17 ENCOUNTER — Other Ambulatory Visit: Payer: Self-pay | Admitting: Cardiovascular Disease

## 2018-06-17 DIAGNOSIS — I6529 Occlusion and stenosis of unspecified carotid artery: Secondary | ICD-10-CM

## 2018-06-18 ENCOUNTER — Ambulatory Visit (HOSPITAL_COMMUNITY)
Admission: RE | Admit: 2018-06-18 | Discharge: 2018-06-18 | Disposition: A | Discharge status: Home / Self Care | Payer: Medicare Other | Source: Ambulatory Visit | Attending: Cardiology | Admitting: Cardiology

## 2018-06-18 DIAGNOSIS — I6529 Occlusion and stenosis of unspecified carotid artery: Secondary | ICD-10-CM | POA: Insufficient documentation

## 2018-06-21 ENCOUNTER — Telehealth: Payer: Self-pay

## 2018-06-21 DIAGNOSIS — I6522 Occlusion and stenosis of left carotid artery: Secondary | ICD-10-CM

## 2018-07-02 ENCOUNTER — Ambulatory Visit: Payer: Medicare Other | Admitting: Podiatry

## 2018-07-04 NOTE — Telephone Encounter (Signed)
Encounter closed

## 2018-07-09 ENCOUNTER — Encounter: Payer: Self-pay | Admitting: Podiatry

## 2018-07-09 ENCOUNTER — Other Ambulatory Visit: Payer: Self-pay

## 2018-07-09 ENCOUNTER — Ambulatory Visit (INDEPENDENT_AMBULATORY_CARE_PROVIDER_SITE_OTHER): Payer: Medicare Other | Admitting: Podiatry

## 2018-07-09 VITALS — Temp 97.5°F

## 2018-07-09 DIAGNOSIS — E1142 Type 2 diabetes mellitus with diabetic polyneuropathy: Secondary | ICD-10-CM | POA: Diagnosis not present

## 2018-07-09 DIAGNOSIS — L84 Corns and callosities: Secondary | ICD-10-CM

## 2018-07-09 DIAGNOSIS — E1151 Type 2 diabetes mellitus with diabetic peripheral angiopathy without gangrene: Secondary | ICD-10-CM

## 2018-07-09 NOTE — Progress Notes (Signed)
Subjective: Jon Anderson is a 68 y.o. y.o. male who presents for diabetic foot care with h/o  PAD pre-ulcerative lesion submetatarsal head 5 bilaterally.  He relates he is beginning to feel pain as his appointment was rescheduled and it's been about 7 weeks.  He relates he broke his arm after a fall while changing the tire on his car.  Jon Panda, MD is his PCP.  He is wearing his diabetic shoes with custom molded insoles on today. He is requesting new shoe gear on today.   Current Outpatient Medications:  .  acetaminophen (TYLENOL) 500 MG tablet, Take 1,000 mg by mouth 2 (two) times daily as needed for moderate pain or headache., Disp: , Rfl:  .  aspirin EC 81 MG tablet, Take 1 tablet (81 mg total) by mouth daily., Disp: , Rfl:  .  atorvastatin (LIPITOR) 40 MG tablet, Take 1 tablet (40 mg total) by mouth daily at 6 PM., Disp: 30 tablet, Rfl: 5 .  calcium acetate (PHOSLO) 667 MG capsule, Take 3 capsules (2,001 mg total) by mouth 3 (three) times daily with meals. (Patient taking differently: Take 1,334 mg by mouth 2 (two) times daily with a meal. ), Disp: 270 capsule, Rfl: 0 .  cyclobenzaprine (FLEXERIL) 5 MG tablet, Take 1 tablet (5 mg total) by mouth 3 (three) times daily as needed for muscle spasms., Disp: 10 tablet, Rfl: 0 .  diclofenac (VOLTAREN) 75 MG EC tablet, Take 75 mg by mouth 2 (two) times daily as needed for mild pain. , Disp: , Rfl:  .  gabapentin (NEURONTIN) 100 MG capsule, Take 100 mg by mouth See admin instructions. Take 100 mg in the morning and 300 mg in the evening, Disp: , Rfl:  .  HYDROcodone-acetaminophen (NORCO/VICODIN) 5-325 MG tablet, Take 1 tablet by mouth every 4 (four) hours as needed., Disp: 10 tablet, Rfl: 0 .  multivitamin (RENA-VIT) TABS tablet, Take 1 tablet by mouth at bedtime., Disp: 30 tablet, Rfl: 0 .  nitroGLYCERIN (NITROSTAT) 0.4 MG SL tablet, Place 1 tablet (0.4 mg total) under the tongue every 5 (five) minutes as needed for chest pain., Disp: 25  tablet, Rfl: 3 .  oxyCODONE-acetaminophen (PERCOCET/ROXICET) 5-325 MG tablet, Take 1 tablet by mouth every 4 (four) hours as needed for moderate pain or severe pain., Disp: , Rfl:  .  pantoprazole (PROTONIX) 40 MG tablet, Take 40 mg by mouth daily., Disp: , Rfl:  .  polyethylene glycol (MIRALAX / GLYCOLAX) packet, Take 17 g by mouth daily as needed for moderate constipation., Disp: 14 each, Rfl: 0 .  rOPINIRole (REQUIP) 0.5 MG tablet, Take 0.5 mg by mouth 2 (two) times daily. , Disp: , Rfl:   Allergies  Allergen Reactions  . Kiwi Extract Swelling and Other (See Comments)    Facial swelling   . Tape Other (See Comments)    Plastic tape-blistering    Objective: Vascular Examination: Capillary refill time <3 seconds x 10 digits  Dorsalis pedis pulses absent b/l  Posterior tibial pulses faintly palpable b/l  No digital hair x 10 digits  Skin temperature gradient within normal limits bilaterally  Dermatological Examination: Skin thin, shiny and atrophic b/l  Toenails 1-5 b/l are painful, elongated, discolored, dystrophic with subungual debris. Pain with dorsal palpation of nailplates. No erythema, no edema, no drainage noted.  Hyperkeratotic lesion noted submetatarsal head 5 bilaterally with subdermal hemorrhage.  +Fat pad atrophy plantar aspect of both feet. There is no erythema, no edema, no drainage, no flocculence noted.  Musculoskeletal: Muscle strength 5/5 to all LE muscle groups  Neurological: Sensation diminished with 10 gram monofilament.  Vibratory sensation diminished  Assessment: 1.  Painful onychomycosis 1-5 b/l 2.  Pre-ulcerative calluses submetatarsal head 5 bilaterally 3.  NIDDM with peripheral arterial disease  Plan:. 1. Toenails 1-5 b/l were debrided in length and girth without iatrogenic bleeding. 2. Hyperkeratotic lesion(s) pared submetatarsal head 5 bilaterally with sterile scalpel l blade and gently smoothed with burr. 3. Will start diabetic shoe  process for him on today with above diagnoses. 4. Patient to continue his diabetic shoe gear daily. 5. Patient to report any pedal injuries to medical professional immediately. 6. Follow up 6 weeks.  7. Patient/POA to call should there be a concern in the interim.

## 2018-07-12 ENCOUNTER — Emergency Department (HOSPITAL_COMMUNITY): Payer: Medicare Other

## 2018-07-12 ENCOUNTER — Other Ambulatory Visit: Payer: Self-pay

## 2018-07-12 ENCOUNTER — Encounter (HOSPITAL_COMMUNITY): Payer: Self-pay

## 2018-07-12 ENCOUNTER — Emergency Department (HOSPITAL_COMMUNITY)
Admission: EM | Admit: 2018-07-12 | Discharge: 2018-07-12 | Disposition: A | Discharge status: Home / Self Care | Payer: Medicare Other | Attending: Emergency Medicine | Admitting: Emergency Medicine

## 2018-07-12 DIAGNOSIS — I120 Hypertensive chronic kidney disease with stage 5 chronic kidney disease or end stage renal disease: Secondary | ICD-10-CM | POA: Diagnosis not present

## 2018-07-12 DIAGNOSIS — E119 Type 2 diabetes mellitus without complications: Secondary | ICD-10-CM | POA: Diagnosis not present

## 2018-07-12 DIAGNOSIS — N186 End stage renal disease: Secondary | ICD-10-CM | POA: Insufficient documentation

## 2018-07-12 DIAGNOSIS — R0789 Other chest pain: Secondary | ICD-10-CM

## 2018-07-12 DIAGNOSIS — Z79899 Other long term (current) drug therapy: Secondary | ICD-10-CM | POA: Diagnosis not present

## 2018-07-12 DIAGNOSIS — Z87891 Personal history of nicotine dependence: Secondary | ICD-10-CM | POA: Diagnosis not present

## 2018-07-12 LAB — CBC WITH DIFFERENTIAL/PLATELET
Abs Immature Granulocytes: 0.02 10*3/uL (ref 0.00–0.07)
Basophils Absolute: 0 10*3/uL (ref 0.0–0.1)
Basophils Relative: 0 %
Eosinophils Absolute: 0.4 10*3/uL (ref 0.0–0.5)
Eosinophils Relative: 6 %
HCT: 39.9 % (ref 39.0–52.0)
Hemoglobin: 12.6 g/dL — ABNORMAL LOW (ref 13.0–17.0)
Immature Granulocytes: 0 %
Lymphocytes Relative: 33 %
Lymphs Abs: 2.2 10*3/uL (ref 0.7–4.0)
MCH: 28.1 pg (ref 26.0–34.0)
MCHC: 31.6 g/dL (ref 30.0–36.0)
MCV: 89.1 fL (ref 80.0–100.0)
Monocytes Absolute: 0.6 10*3/uL (ref 0.1–1.0)
Monocytes Relative: 9 %
Neutro Abs: 3.4 10*3/uL (ref 1.7–7.7)
Neutrophils Relative %: 52 %
Platelets: 228 10*3/uL (ref 150–400)
RBC: 4.48 MIL/uL (ref 4.22–5.81)
RDW: 15.9 % — ABNORMAL HIGH (ref 11.5–15.5)
WBC: 6.6 10*3/uL (ref 4.0–10.5)
nRBC: 0 % (ref 0.0–0.2)

## 2018-07-12 LAB — BASIC METABOLIC PANEL
Anion gap: 17 — ABNORMAL HIGH (ref 5–15)
BUN: 31 mg/dL — ABNORMAL HIGH (ref 8–23)
CO2: 27 mmol/L (ref 22–32)
Calcium: 9.4 mg/dL (ref 8.9–10.3)
Chloride: 93 mmol/L — ABNORMAL LOW (ref 98–111)
Creatinine, Ser: 8.99 mg/dL — ABNORMAL HIGH (ref 0.61–1.24)
GFR calc Af Amer: 6 mL/min — ABNORMAL LOW (ref >60)
GFR calc non Af Amer: 5 mL/min — ABNORMAL LOW (ref >60)
Glucose, Bld: 77 mg/dL (ref 70–99)
Potassium: 3.9 mmol/L (ref 3.5–5.1)
Sodium: 137 mmol/L (ref 135–145)

## 2018-07-12 LAB — CBG MONITORING, ED
Glucose-Capillary: 63 mg/dL — ABNORMAL LOW (ref 70–99)
Glucose-Capillary: 122 mg/dL — ABNORMAL HIGH (ref 70–99)

## 2018-07-12 LAB — TROPONIN I
Troponin I: 0.03 ng/mL (ref <0.03)
Troponin I: <0.03 ng/mL (ref <0.03)

## 2018-07-12 LAB — BRAIN NATRIURETIC PEPTIDE: B Natriuretic Peptide: 201.8 pg/mL — ABNORMAL HIGH (ref 0.0–100.0)

## 2018-07-12 LAB — D-DIMER, QUANTITATIVE: D-Dimer, Quant: 1.14 ug/mL-FEU — ABNORMAL HIGH (ref 0.00–0.50)

## 2018-07-12 MED ORDER — NITROGLYCERIN 0.4 MG SL SUBL
0.4000 mg | SUBLINGUAL_TABLET | SUBLINGUAL | Status: DC | PRN
  Administered 2018-07-12: 10:00:00 0.4 mg via SUBLINGUAL
  Filled 2018-07-12: qty 1

## 2018-07-12 MED ORDER — IOHEXOL 350 MG/ML SOLN
75.0000 mL | Freq: Once | INTRAVENOUS | Status: AC | PRN
  Administered 2018-07-12: 75 mL via INTRAVENOUS

## 2018-07-12 MED ORDER — DEXTROSE 50 % IV SOLN
25.0000 mL | Freq: Once | INTRAVENOUS | Status: AC
  Administered 2018-07-12: 25 mL via INTRAVENOUS
  Filled 2018-07-12: qty 50

## 2018-07-12 MED ORDER — IOHEXOL 350 MG/ML SOLN: 80.0000 mL | Freq: Once | INTRAVENOUS | Status: DC | PRN

## 2018-07-12 NOTE — ED Triage Notes (Signed)
Pt arrives POV from home c/o left sided chest pain that radiates towards his back. Pt reports CP started Wednesday 07/10/2018. Pt's hx includes DM, bypass surgery in 2018, and goes to dialysis MWF. Pt states he was supposed to go to dialysis at 0700 today but could not go. Pt states he takes plavix daily but has not taken it today.

## 2018-07-12 NOTE — ED Notes (Signed)
Patient transported to X-ray 

## 2018-07-12 NOTE — Discharge Instructions (Signed)
Your work-up today was reassuring that you are not having a heart attack or chest pain.  Please go to dialysis as soon as possible today to get your dialysis treatment.  They are expecting you.  Follow-up with your primary care physician or cardiologist for reevaluation of your symptoms if they persist.  Return to the emergency department if any concerning signs or symptoms develop such as worsening chest pains, shortness of breath, passing out, high fevers

## 2018-07-12 NOTE — ED Notes (Signed)
Patient denies chest pain at this time 

## 2018-07-12 NOTE — ED Notes (Signed)
Notified ED provider of troponin level.

## 2018-07-12 NOTE — ED Notes (Signed)
ED Provider at bedside. 

## 2018-07-12 NOTE — ED Provider Notes (Signed)
[facility] EMERGENCY DEPARTMENT Provider Note   CSN: [id number] Arrival date & time: 07/12/18  4098    History   Chief Complaint Chief Complaint  Patient presents with   Chest Pain    HPI Jon Anderson is a 68 y.o. male with history of ESRD on dialysis Monday Wednesday Friday, CAD, GERD, hypertension, hyperlipidemia, peripheral arterial disease, type 2 diabetes mellitus presents for evaluation of acute onset, intermittent left-sided chest pains for 3 days.  He reports symptoms began on Wednesday evening sometime after dialysis.  The pain initially began as dull but with deep inspiration and certain movements it becomes more sharp and severe.  He notes associated shortness of breath with it which worsens with any activity or attempts to ambulate.  Pain radiates to the back.  Denies fever, cough, nausea, vomiting, abdominal pain, diaphoresis, lightheadedness, or syncope.  He has not tried anything for his symptoms.  Is scheduled to go to dialysis today.  He is a non-smoker.  No known sick contacts, no recent travel or surgeries, no hemoptysis, no prior history of DVT or PE to his knowledge, no hormone replacement therapy.      The history is provided by the patient.    Past Medical History:  Diagnosis Date   Anemia of chronic disease    CAD (coronary artery disease)    a.  Myoview 4/11: EF 53%, no scar or ischemia   c. MV 2012 Nl perfusion, apical thinning.  No ischemia or scar.  EF 49%, appears greater by visual estimate.;  d.  Dob stress echo 12/13:  Negative Dob stress echo. There is no evidence of ischemia.  The LVF is normal. b. Normal cors 2016.   Carotid stenosis    a. <11% RICA, >91% LICA by duplex 07/7827   Chronic chest pain    occ   ESRD (end stage renal disease) on dialysis [facility])    M-W-F   GERD (gastroesophageal reflux disease)    HNP (herniated nucleus pulposus), lumbar    HTN (hypertension)    echo 3/10: EF 60%, LAE   Hyperlipidemia      Nephrolithiasis    "passed them all"   Peripheral arterial disease ([place])    a. s/p PTCA to L SFA.   Pneumonia    Restless legs    Sleep apnea    no cpap, needs to reschedule appointment to set up aquiring cpap   Snores    a. presumed OSA, pt has refused sleep eval in past.   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/[facility] 03/17/2017   Anemia of chronic disease 03/17/2017   Severe uncontrolled hypertension 03/17/2017   ESRD (end stage renal disease) on dialysis ([place]) 03/17/2017   Hyperlipidemia 03/17/2017   Chronic chest pain 03/17/2017   Type II diabetes mellitus ([place]) 03/17/2017   Acute on chronic diastolic heart failure ([place]) 03/17/2017   Acute heart failure ([place]) 03/17/2017   Hemodialysis status ([place]) 01/25/2017   Lumbar radiculopathy  01/16/2017   Pre-transplant evaluation for kidney transplant 06/15/2016   Increased frequency of urination 12/17/2015   Nocturia 12/17/2015   Chest pain, non-cardiac 10/09/2015   Acute on chronic renal failure ([place]) 06/16/2014   Renal failure (ARF), acute on chronic (HCC) 06/16/2014   Shoulder pain, left 12/15/2013   Chest pain 12/15/2013   Claudication ([place]) 12/11/2013   PVD (peripheral vascular disease) ([place]) 12/11/2013   Peripheral arterial disease ([place]) 09/30/2013   Carotid artery disease ([place]) 09/30/2013   Acute chest pain 11/15/2012   Left-sided chest wall pain 03/19/2012   Bruit  09/15/2010   CAD (coronary artery disease) nonobstructive per cath 2012    Precordial chest pain 07/26/2009   DM (diabetes mellitus) ([place]) 07/22/2009   Hyperlipidemia LDL goal <70 07/22/2009   Hypertension 07/22/2009    Past Surgical History:  Procedure Laterality Date   ANGIOPLASTY / STENTING FEMORAL Left 12/11/2013   dr berry   AV FISTULA PLACEMENT Left 03/19/2014   Procedure: CREATION OF ARTERIOVENOUS (AV) FISTULA  LEFT UPPER ARM;  Surgeon: Mal Misty, MD;  Location: [place];  Service: Vascular;  Laterality: Left;   CARDIAC CATHETERIZATION  2001 and 2010    COLONOSCOPY W/ BIOPSIES AND POLYPECTOMY     COLONOSCOPY WITH PROPOFOL N/A 08/01/2016   Procedure: COLONOSCOPY WITH PROPOFOL;  Surgeon: Carol Ada, MD;  Location: [facility];  Service: Endoscopy;  Laterality: N/A;   CORONARY ARTERY BYPASS GRAFT  2018   ESOPHAGOGASTRODUODENOSCOPY (EGD) WITH PROPOFOL N/A 08/01/2016   Procedure: ESOPHAGOGASTRODUODENOSCOPY (EGD) WITH PROPOFOL;  Surgeon: Carol Ada, MD;  Location: [facility];  Service: Endoscopy;  Laterality: N/A;   FOOT FRACTURE SURGERY Right    ligament repair   FRACTURE SURGERY     INGUINAL HERNIA REPAIR Left    LEFT HEART CATH AND CORS/GRAFTS ANGIOGRAPHY N/A 04/27/2017   Procedure: LEFT HEART CATH AND CORS/GRAFTS ANGIOGRAPHY;  Surgeon: Leonie Man, MD;  Location: [place] [facility];  Service: Cardiovascular;  Laterality: N/A;   LEFT HEART CATHETERIZATION WITH CORONARY ANGIOGRAM N/A 06/22/2014   Procedure: LEFT HEART CATHETERIZATION WITH CORONARY ANGIOGRAM;  Surgeon: Troy Sine, MD;  Location: [facility] CATH LAB;  Service: Cardiovascular;  Laterality: N/A;   LOWER EXTREMITY ANGIOGRAM Left 12/11/2013   Procedure: LOWER EXTREMITY ANGIOGRAM;  Surgeon: Lorretta Harp, MD;  Location: [facility] CATH LAB;  Service: Cardiovascular;  Laterality: Left;   LUMBAR LAMINECTOMY/DECOMPRESSION MICRODISCECTOMY Right 07/03/2017   Procedure: MICRODISCECTOMY LUMBAR FIVE - SACRAL ONE  RIGHT;  Surgeon: Consuella Lose, MD;  Location: [place];  Service: Neurosurgery;  Laterality: Right;   LUMBAR LAMINECTOMY/DECOMPRESSION MICRODISCECTOMY Right 10/19/2017   Procedure: MICRODISCECTOMY LUMBAR FIVE- SACRAL 1 ONE ;  Surgeon: Consuella Lose, MD;  Location: [place];  Service: Neurosurgery;  Laterality: Right;   TONSILLECTOMY AND ADENOIDECTOMY     WISDOM TOOTH EXTRACTION          Home Medications    Prior to Admission medications   Medication Sig Start Date End Date Taking? Authorizing Provider  acetaminophen (TYLENOL) 500 MG tablet Take 1,000 mg by mouth 2 (two) times daily as needed for moderate pain or headache.   Yes [provider]  atorvastatin (LIPITOR) 40 MG tablet Take 1 tablet (40 mg total) by mouth daily at 6 PM. 12/13/13  Yes Brett Canales, PA-C  calcium acetate (PHOSLO) 667 MG capsule Take 3 capsules (2,001 mg total) by mouth 3 (three) times daily with meals. Patient taking differently: Take 1,334 mg by mouth 2 (two) times daily with a meal.  10/09/15  Yes Short, Noah Delaine, MD  gabapentin (NEURONTIN) 100 MG capsule Take 100 mg by mouth 2 (two) times daily.    Yes [provider]  HYDROcodone-acetaminophen (NORCO/VICODIN) 5-325 MG tablet Take 1 tablet by mouth every 4 (four) hours as needed. 06/14/18  Yes Tacy Learn, PA-C  multivitamin (RENA-VIT) TABS tablet Take 1 tablet by mouth at bedtime. 06/23/14  Yes Domenic Polite, MD  nitroGLYCERIN (NITROSTAT) 0.4 MG SL tablet Place 1 tablet (0.4 mg total) under the tongue every 5 (five) minutes as needed for chest pain. 03/20/12  Yes Barrett, Evelene Croon, PA-C  pantoprazole (PROTONIX) 40 MG tablet Take 40 mg by mouth daily.   Yes [provider]  polyethylene glycol (MIRALAX / GLYCOLAX) packet Take 17 g by mouth daily as needed for moderate constipation. 03/22/17  Yes Aline August, MD  tamsulosin (FLOMAX) 0.4 MG CAPS capsule Take 0.4 mg by mouth daily. 05/23/18  Yes [provider]  aspirin  EC 81 MG tablet Take 1 tablet (81 mg total) by mouth daily. 05/28/12   Richardson Dopp T, PA-C  cyclobenzaprine (FLEXERIL) 5 MG tablet Take 1 tablet (5 mg total) by mouth 3 (three) times daily as needed for muscle spasms. Patient not taking: Reported on 07/12/2018 02/17/18   Drenda Freeze, MD    Family History Family History  Problem Relation Age of Onset   Heart attack Sister        died @ 58   Cancer Mother        died @ 46; unknown type   Diabetes Brother        deceased   Cirrhosis Father        alcohol related   Diabetes Father    Esophageal cancer Neg Hx    Colon cancer Neg Hx    Pancreatic cancer Neg Hx    Stomach cancer Neg Hx     Social History Social History   Tobacco Use   Smoking status: Former Smoker    Packs/day: 1.00    Years: 2.00    Pack years: 2.00    Types: Cigarettes   Smokeless tobacco: Never Used   Tobacco comment: quit smoking 40 yrs ago  Substance Use Topics   Alcohol use: No    Alcohol/week: 0.0 standard drinks   Drug use: No     Allergies   Kiwi extract and Tape   Review of Systems Review of Systems  Constitutional: Negative for chills, diaphoresis and fever.  Respiratory: Positive for shortness of breath. Negative for cough.   Cardiovascular: Positive for chest pain. Negative for leg swelling.  Gastrointestinal: Negative for abdominal pain, nausea and vomiting.  Neurological: Negative for light-headedness.  All other systems reviewed and are negative.    Physical Exam Updated Vital Signs BP (!) 141/80    Pulse 76    Temp 98.1 F (36.7 C) (Oral)    Resp 13    Ht 5\' 11"  (1.803 m)    Wt 72.6 kg    SpO2 99%    BMI 22.32 kg/m   Physical Exam Vitals signs and nursing note reviewed.  Constitutional:      General: He is not in acute distress.    Appearance: He is well-developed.  HENT:     Head: Normocephalic and atraumatic.  Eyes:     General:        Right eye: No discharge.        Left eye: No discharge.      Conjunctiva/sclera: Conjunctivae  normal.  Neck:     Vascular: No JVD.     Trachea: No tracheal deviation.  Cardiovascular:     Rate and Rhythm: Normal rate and regular rhythm.     Pulses:          Radial pulses are 2+ on the right side.       Dorsalis pedis pulses are 1+ on the right side and 1+ on the left side.       Posterior tibial pulses are 1+ on the right side and 1+ on the left side.     Heart sounds: Murmur present.     Comments: The fistula is a pressure to palpable thrill.  Unable to assess left radial pulse as wrist is in cast due to recent fall.  Homans sign absent bilaterally Pulmonary:     Effort: Pulmonary effort is normal.     Breath sounds: Examination of the right-upper field reveals decreased breath sounds. Examination of the left-upper field reveals decreased breath sounds. Examination of the right-middle field reveals decreased breath sounds. Examination of the left-middle field reveals decreased breath sounds. Examination of the right-lower field reveals rales. Examination of the left-lower field reveals rales. Decreased breath sounds and rales present.  Chest:     Chest wall: No tenderness.  Abdominal:     General: Bowel sounds are normal. There is no distension.     Palpations: Abdomen is soft.     Tenderness: There is no abdominal tenderness. There is no guarding or rebound.  Musculoskeletal:     Right lower leg: He exhibits no tenderness. No edema.     Left lower leg: He exhibits no tenderness. No edema.  Skin:    General: Skin is warm and dry.     Findings: No erythema.  Neurological:     Mental Status: He is alert.  Psychiatric:        Behavior: Behavior normal.      ED Treatments / Results  Labs (all labs ordered are listed, but only abnormal results are displayed) Labs Reviewed  BASIC METABOLIC PANEL - Abnormal; Notable for the following components:      Result Value   Chloride 93 (*)    BUN 31 (*)    Creatinine, Ser 8.99 (*)    GFR calc non  Af Amer 5 (*)    GFR calc Af Amer 6 (*)    Anion gap 17 (*)    All other components within normal limits  TROPONIN I - Abnormal; Notable for the following components:   Troponin I 0.03 (*)    All other components within normal limits  CBC WITH DIFFERENTIAL/PLATELET - Abnormal; Notable for the following components:   Hemoglobin 12.6 (*)    RDW 15.9 (*)    All other components within normal limits  D-DIMER, QUANTITATIVE (NOT AT [facility]) - Abnormal; Notable for the following components:   D-Dimer, Quant 1.14 (*)    All other components within normal limits  BRAIN NATRIURETIC PEPTIDE - Abnormal; Notable for the following components:   B Natriuretic Peptide 201.8 (*)    All other components within normal limits  CBG MONITORING, ED - Abnormal; Notable for the following components:   Glucose-Capillary 63 (*)    All other components within normal limits  CBG MONITORING, ED - Abnormal; Notable for the following components:   Glucose-Capillary 122 (*)    All other components within normal limits  TROPONIN I    EKG EKG Interpretation  Date/Time:  Friday July 12 2018 07:33:33 EDT Ventricular Rate:  80 PR Interval:    QRS Duration: 104 QT Interval:  385 QTC Calculation: 445 R Axis:   51 Text Interpretation:  Normal sinus rhythm No significant change since last tracing Confirmed by Pattricia Boss [phone]) on 07/12/2018 7:38:02 AM Also confirmed by Pattricia Boss [phone]), editor Lynder Parents [phone])  on 07/12/2018 9:49:19 AM   Radiology Dg Chest 2 View  Result Date: 07/12/2018 CLINICAL DATA:  Chest pain shortness of breath. EXAM: CHEST - 2 VIEW COMPARISON:  Chest x-ray dated May 31, 2018. FINDINGS: The heart size and mediastinal contours are within normal limits. Atherosclerotic calcification of the aortic arch. Normal pulmonary vascularity. No focal consolidation, pleural effusion, or pneumothorax. No acute osseous abnormality. IMPRESSION: No active cardiopulmonary disease. Electronically  Signed   By: Titus Dubin M.D.   On: 07/12/2018 08:22   Ct Angio Chest Pe W And/or Wo Contrast  Result Date: 07/12/2018 CLINICAL DATA:  Chest pain radiating to the back for the past 3 days. EXAM: CT ANGIOGRAPHY CHEST WITH CONTRAST TECHNIQUE: Multidetector CT imaging of the chest was performed using the standard protocol during bolus administration of intravenous contrast. Multiplanar CT image reconstructions and MIPs were obtained to evaluate the vascular anatomy. CONTRAST:  69mL OMNIPAQUE IOHEXOL 350 MG/ML SOLN COMPARISON:  CTA chest dated February 22, 2018. FINDINGS: Cardiovascular: Satisfactory opacification of the pulmonary arteries to the segmental level. No evidence of pulmonary embolism. Normal heart size. No pericardial effusion. No thoracic aortic aneurysm. Coronary, aortic arch, and branch vessel atherosclerotic vascular disease. Mediastinum/Nodes: No enlarged mediastinal, hilar, or axillary lymph nodes. Thyroid gland, trachea, and esophagus demonstrate no significant findings. Lungs/Pleura: Minimal dependent subsegmental atelectasis in both lower lobes. No focal consolidation, pleural effusion, or pneumothorax. No suspicious pulmonary nodule. Upper Abdomen: No acute abnormality. Unchanged bilateral adrenal adenomas and bilateral renal atrophy. Musculoskeletal: Bilateral gynecomastia. No acute or significant osseous findings. Review of the MIP images confirms the above findings. IMPRESSION: 1. No evidence of pulmonary embolism. No acute intrathoracic process. 2.  Aortic atherosclerosis (ICD10-I70.0). Electronically Signed   By: Titus Dubin M.D.   On: 07/12/2018 11:41    Procedures Procedures (including critical care time)  Medications Ordered in ED Medications  nitroGLYCERIN (NITROSTAT) SL tablet 0.4 mg (0.4 mg Sublingual Given 07/12/18 0957)  iohexol (OMNIPAQUE) 350 MG/ML injection 80 mL (has no administration in time range)  dextrose 50 % solution 25 mL (25 mLs Intravenous Given 07/12/18  0758)  iohexol (OMNIPAQUE) 350 MG/ML injection 75 mL (75 mLs Intravenous Contrast Given 07/12/18 1132)     Initial Impression / Assessment and Plan / ED Course  I have reviewed the triage vital signs and the nursing notes.  Pertinent labs & imaging results that were available during my care of the patient were reviewed by me and considered in my medical decision making (see chart for details).        Patient presenting with intermittent left-sided chest pains for 3 days.  He is afebrile, somewhat hypertensive in the ED but vital signs otherwise stable.  He is nontoxic in appearance.  Pain is not reproducible on palpation but is pleuritic in nature and is associated with some shortness of breath.  EKG shows no acute ischemic changes.  Initial troponin mildly elevated but repeat troponin within normal limits.  Chart review shows that he has a chronically elevated troponin likely secondary to his renal disease.  Low suspicion of ACS/MI at this time.  Lab work reviewed by me shows stable H&H, no leukocytosis,  lab work at his baseline.  He is compliant with his dialysis.  D-dimer elevated so a CTA of the chest was obtained which shows no evidence of PE or pneumonia.  No evidence of cardiac tamponade, esophageal rupture, pneumothorax, or dissection.  He was offered sublingual nitroglycerin and other pain medicines in the ED which he declined.  On reevaluation he is resting comfortably in no apparent distress.  He reports that he is chest pain-free. RN Marsh Dolly contacted his dialysis facility who state that have availability for him to obtain his dialysis treatment today.  No further emergent work-up required at this time.  Recommend follow-up with PCP or cardiology for reevaluation of symptoms.  Discussed strict ED return precautions. Pt verbalized understanding of and agreement with plan and is safe for discharge home at this time.  Gust with Dr. Jeanell Sparrow who agrees with assessment and plan at this time  Final  Clinical Impressions(s) / ED Diagnoses   Final diagnoses:  Atypical chest pain    ED Discharge Orders    None       Renita Papa, PA-C 07/12/18 1341    Pattricia Boss, MD 07/14/18 1427

## 2018-07-12 NOTE — ED Notes (Signed)
Patient verbalizes understanding of discharge instructions. Opportunity for questioning and answering were provided.  patient discharged from ED.  

## 2018-07-17 ENCOUNTER — Emergency Department (HOSPITAL_COMMUNITY): Payer: Medicare Other

## 2018-07-17 ENCOUNTER — Observation Stay (HOSPITAL_COMMUNITY)
Admission: EM | Admit: 2018-07-17 | Discharge: 2018-07-19 | Disposition: A | Discharge status: Home / Self Care | Payer: Medicare Other | Attending: Family Medicine | Admitting: Family Medicine

## 2018-07-17 ENCOUNTER — Other Ambulatory Visit: Payer: Self-pay

## 2018-07-17 ENCOUNTER — Encounter (HOSPITAL_COMMUNITY): Payer: Self-pay | Admitting: Emergency Medicine

## 2018-07-17 DIAGNOSIS — R0789 Other chest pain: Secondary | ICD-10-CM | POA: Diagnosis not present

## 2018-07-17 DIAGNOSIS — Z79899 Other long term (current) drug therapy: Secondary | ICD-10-CM | POA: Insufficient documentation

## 2018-07-17 DIAGNOSIS — I120 Hypertensive chronic kidney disease with stage 5 chronic kidney disease or end stage renal disease: Secondary | ICD-10-CM | POA: Diagnosis not present

## 2018-07-17 DIAGNOSIS — N186 End stage renal disease: Secondary | ICD-10-CM

## 2018-07-17 DIAGNOSIS — Z7982 Long term (current) use of aspirin: Secondary | ICD-10-CM | POA: Diagnosis not present

## 2018-07-17 DIAGNOSIS — E119 Type 2 diabetes mellitus without complications: Secondary | ICD-10-CM

## 2018-07-17 DIAGNOSIS — E785 Hyperlipidemia, unspecified: Secondary | ICD-10-CM | POA: Diagnosis present

## 2018-07-17 DIAGNOSIS — I10 Essential (primary) hypertension: Secondary | ICD-10-CM | POA: Diagnosis present

## 2018-07-17 DIAGNOSIS — Z951 Presence of aortocoronary bypass graft: Secondary | ICD-10-CM

## 2018-07-17 DIAGNOSIS — R0602 Shortness of breath: Secondary | ICD-10-CM | POA: Diagnosis present

## 2018-07-17 DIAGNOSIS — Z992 Dependence on renal dialysis: Secondary | ICD-10-CM | POA: Insufficient documentation

## 2018-07-17 DIAGNOSIS — Z87891 Personal history of nicotine dependence: Secondary | ICD-10-CM | POA: Diagnosis not present

## 2018-07-17 DIAGNOSIS — R079 Chest pain, unspecified: Secondary | ICD-10-CM

## 2018-07-17 DIAGNOSIS — I739 Peripheral vascular disease, unspecified: Secondary | ICD-10-CM | POA: Diagnosis present

## 2018-07-17 LAB — CBC
HCT: 37.8 % — ABNORMAL LOW (ref 39.0–52.0)
Hemoglobin: 11.6 g/dL — ABNORMAL LOW (ref 13.0–17.0)
MCH: 27.4 pg (ref 26.0–34.0)
MCHC: 30.7 g/dL (ref 30.0–36.0)
MCV: 89.4 fL (ref 80.0–100.0)
Platelets: 216 10*3/uL (ref 150–400)
RBC: 4.23 MIL/uL (ref 4.22–5.81)
RDW: 15.9 % — ABNORMAL HIGH (ref 11.5–15.5)
WBC: 7.3 10*3/uL (ref 4.0–10.5)
nRBC: 0 % (ref 0.0–0.2)

## 2018-07-17 LAB — BASIC METABOLIC PANEL
Anion gap: 18 — ABNORMAL HIGH (ref 5–15)
BUN: 30 mg/dL — ABNORMAL HIGH (ref 8–23)
CO2: 26 mmol/L (ref 22–32)
Calcium: 9.6 mg/dL (ref 8.9–10.3)
Chloride: 94 mmol/L — ABNORMAL LOW (ref 98–111)
Creatinine, Ser: 9.7 mg/dL — ABNORMAL HIGH (ref 0.61–1.24)
GFR calc Af Amer: 6 mL/min — ABNORMAL LOW (ref >60)
GFR calc non Af Amer: 5 mL/min — ABNORMAL LOW (ref >60)
Glucose, Bld: 106 mg/dL — ABNORMAL HIGH (ref 70–99)
Potassium: 4 mmol/L (ref 3.5–5.1)
Sodium: 138 mmol/L (ref 135–145)

## 2018-07-17 LAB — HEPATIC FUNCTION PANEL
ALT: 14 U/L (ref 0–44)
AST: 14 U/L — ABNORMAL LOW (ref 15–41)
Albumin: 3.5 g/dL (ref 3.5–5.0)
Alkaline Phosphatase: 72 U/L (ref 38–126)
Bilirubin, Direct: <0.1 mg/dL (ref 0.0–0.2)
Total Bilirubin: 0.3 mg/dL (ref 0.3–1.2)
Total Protein: 7.2 g/dL (ref 6.5–8.1)

## 2018-07-17 LAB — TROPONIN I
Troponin I: 0.04 ng/mL (ref <0.03)
Troponin I: 0.03 ng/mL (ref <0.03)

## 2018-07-17 LAB — GLUCOSE, CAPILLARY: Glucose-Capillary: 91 mg/dL (ref 70–99)

## 2018-07-17 LAB — LIPASE, BLOOD: Lipase: 35 U/L (ref 11–51)

## 2018-07-17 MED ORDER — INSULIN ASPART 100 UNIT/ML ~~LOC~~ SOLN: 0.0000 [IU] | Freq: Every day | SUBCUTANEOUS | Status: DC

## 2018-07-17 MED ORDER — IOHEXOL 350 MG/ML SOLN
100.0000 mL | Freq: Once | INTRAVENOUS | Status: AC | PRN
  Administered 2018-07-17: 100 mL via INTRAVENOUS

## 2018-07-17 MED ORDER — HYDROCODONE-ACETAMINOPHEN 5-325 MG PO TABS
1.0000 | ORAL_TABLET | ORAL | Status: DC | PRN
  Administered 2018-07-17 – 2018-07-19 (×3): 1 via ORAL
  Filled 2018-07-17 (×3): qty 1

## 2018-07-17 MED ORDER — CYCLOBENZAPRINE HCL 10 MG PO TABS: 5.0000 mg | ORAL_TABLET | Freq: Three times a day (TID) | ORAL | Status: DC | PRN

## 2018-07-17 MED ORDER — HYDRALAZINE HCL 20 MG/ML IJ SOLN: 5.0000 mg | Freq: Four times a day (QID) | INTRAMUSCULAR | Status: DC | PRN

## 2018-07-17 MED ORDER — ACETAMINOPHEN 325 MG PO TABS: 650.0000 mg | ORAL_TABLET | ORAL | Status: DC | PRN

## 2018-07-17 MED ORDER — ATORVASTATIN CALCIUM 40 MG PO TABS
40.0000 mg | ORAL_TABLET | Freq: Every day | ORAL | Status: DC
  Administered 2018-07-18 – 2018-07-19 (×2): 40 mg via ORAL
  Filled 2018-07-17 (×2): qty 1

## 2018-07-17 MED ORDER — ASPIRIN EC 81 MG PO TBEC
81.0000 mg | DELAYED_RELEASE_TABLET | Freq: Every day | ORAL | Status: DC
  Administered 2018-07-17 – 2018-07-19 (×3): 81 mg via ORAL
  Filled 2018-07-17 (×3): qty 1

## 2018-07-17 MED ORDER — INSULIN ASPART 100 UNIT/ML ~~LOC~~ SOLN: 0.0000 [IU] | Freq: Three times a day (TID) | SUBCUTANEOUS | Status: DC

## 2018-07-17 MED ORDER — ONDANSETRON HCL 4 MG/2ML IJ SOLN
4.0000 mg | Freq: Four times a day (QID) | INTRAMUSCULAR | Status: DC | PRN
  Administered 2018-07-17 – 2018-07-19 (×3): 4 mg via INTRAVENOUS
  Filled 2018-07-17 (×2): qty 2

## 2018-07-17 MED ORDER — PANTOPRAZOLE SODIUM 40 MG PO TBEC
40.0000 mg | DELAYED_RELEASE_TABLET | Freq: Every day | ORAL | Status: DC
  Administered 2018-07-17 – 2018-07-19 (×3): 40 mg via ORAL
  Filled 2018-07-17 (×3): qty 1

## 2018-07-17 MED ORDER — GABAPENTIN 100 MG PO CAPS
100.0000 mg | ORAL_CAPSULE | Freq: Two times a day (BID) | ORAL | Status: DC
  Administered 2018-07-17 – 2018-07-19 (×4): 100 mg via ORAL
  Filled 2018-07-17 (×4): qty 1

## 2018-07-17 MED ORDER — RENA-VITE PO TABS
1.0000 | ORAL_TABLET | Freq: Every day | ORAL | Status: DC
  Administered 2018-07-17 – 2018-07-18 (×2): 1 via ORAL
  Filled 2018-07-17 (×2): qty 1

## 2018-07-17 MED ORDER — TAMSULOSIN HCL 0.4 MG PO CAPS
0.4000 mg | ORAL_CAPSULE | Freq: Every day | ORAL | Status: DC
  Administered 2018-07-17 – 2018-07-19 (×3): 0.4 mg via ORAL
  Filled 2018-07-17 (×3): qty 1

## 2018-07-17 MED ORDER — POLYETHYLENE GLYCOL 3350 17 G PO PACK: 17.0000 g | PACK | Freq: Every day | ORAL | Status: DC | PRN

## 2018-07-17 MED ORDER — CALCIUM ACETATE (PHOS BINDER) 667 MG PO CAPS
2001.0000 mg | ORAL_CAPSULE | Freq: Three times a day (TID) | ORAL | Status: DC
  Administered 2018-07-18 – 2018-07-19 (×3): 2001 mg via ORAL
  Filled 2018-07-17 (×3): qty 3

## 2018-07-17 MED ORDER — ONDANSETRON HCL 4 MG/2ML IJ SOLN
4.0000 mg | Freq: Once | INTRAMUSCULAR | Status: AC
  Administered 2018-07-17: 4 mg via INTRAVENOUS
  Filled 2018-07-17: qty 2

## 2018-07-17 NOTE — ED Notes (Signed)
Attempted report x1. 

## 2018-07-17 NOTE — H&P (Signed)
History and Physical    Jon Anderson NAT:[id number] DOB: 01-02-1951 DOA: 07/17/2018  PCP: [name], MD -patient indicates his PCP is Velora Heckler, cardiology follows locally with Dr. Alvester Chou, at [facility] with Dr. Kennith [facility]   Patient coming from: Home  Chief Complaint: Chest pain/abdominal pain  HPI: Jon Anderson is a 68 y.o. male with medical history significant of ESRD dialysis via left fistula Monday Wednesday Friday, non-insulin-dependent diabetes type 2, sleep apnea, and history of coronary artery disease status post CABG 01/2017 at [facility] and multiple catheterizations, carotid artery disease, anemia of chronic disease, GERD, hypertension, hyperlipidemia.  Patient presents with what appears to be an acute on chronic episode of chest pain.  Recently evaluated earlier this week for similar episode; he comes in again today complaining of epigastric/mid chest pain radiating left laterally with sharp pain without notable exacerbating or alleviating factors. He does note associated nausea without vomiting and questionable diaphoresis but has not measured for fever. This is not exacerbated by activity, position, dietary intake, nor is this alleviated by rest.  Patient is a moderately poor historian -unable to further classify his pain other than sharp.  Indicates his pain does radiate midsternally/epigastrically to the left lateral chest wall -but does not include or radiate towards the neck or down the left arm.  *Cardiac catheterization from 04/2017 reviewed. Proximal LAD lesion 75%. LIMA-LAD widely patent. LVEDP 0-3 mmHg. Non-obstructive disease in other coronary arteries.  *TTE from 03/2017 reviewed. LVEF=55%. Moderate LVH. Severe mitral annular calcification with mild mitral stenosis. Severe LA dilatation. Mild TR.   ED Course: In the ED patient was evaluated for atypical chest pain, initial troponin minimally elevated 0.04, CTA chest abdomen pelvis to rule out dissection given patient's  atypical abdomen this was performed, unremarkable as below.  Given ongoing complaints of chest pain with nonnegative troponin and will CAD history as above hospitalist was consulted to admit for further evaluation and work-up.  Review of Systems: As per HPI otherwise 10 point review of systems negative.   Past Medical History:  Diagnosis Date   Anemia of chronic disease    CAD (coronary artery disease)    a.  Myoview 4/11: EF 53%, no scar or ischemia   c. MV 2012 Nl perfusion, apical thinning.  No ischemia or scar.  EF 49%, appears greater by visual estimate.;  d.  Dob stress echo 12/13:  Negative Dob stress echo. There is no evidence of ischemia.  The LVF is normal. b. Normal cors 2016.   Carotid stenosis    a. <42% RICA, >70% LICA by duplex 09/2374   Chronic chest pain    occ   ESRD (end stage renal disease) on dialysis [facility])    M-W-F   GERD (gastroesophageal reflux disease)    HNP (herniated nucleus pulposus), lumbar    HTN (hypertension)    echo 3/10: EF 60%, LAE   Hyperlipidemia    Nephrolithiasis    "passed them all"   Peripheral arterial disease ([place])    a. s/p PTCA to L SFA.   Pneumonia    Restless legs    Sleep apnea    no cpap, needs to reschedule appointment to set up aquiring cpap   Snores    a. presumed OSA, pt has refused sleep eval in past.   Type II diabetes mellitus (HCC)    no longer on medications, checks blood glucose at home    Past Surgical History:  Procedure Laterality Date   ANGIOPLASTY / STENTING FEMORAL  Left 12/11/2013   dr berry   AV FISTULA PLACEMENT Left 03/19/2014   Procedure: CREATION OF ARTERIOVENOUS (AV) FISTULA  LEFT UPPER ARM;  Surgeon: Mal Misty, MD;  Location: [place];  Service: Vascular;  Laterality: Left;   CARDIAC CATHETERIZATION  2001 and 2010    COLONOSCOPY W/ BIOPSIES AND POLYPECTOMY     COLONOSCOPY WITH PROPOFOL N/A 08/01/2016   Procedure: COLONOSCOPY WITH PROPOFOL;  Surgeon: Carol Ada, MD;  Location: [facility]  ENDOSCOPY;  Service: Endoscopy;  Laterality: N/A;   CORONARY ARTERY BYPASS GRAFT  2018   ESOPHAGOGASTRODUODENOSCOPY (EGD) WITH PROPOFOL N/A 08/01/2016   Procedure: ESOPHAGOGASTRODUODENOSCOPY (EGD) WITH PROPOFOL;  Surgeon: Carol Ada, MD;  Location: [facility] ENDOSCOPY;  Service: Endoscopy;  Laterality: N/A;   FOOT FRACTURE SURGERY Right    ligament repair   FRACTURE SURGERY     INGUINAL HERNIA REPAIR Left    LEFT HEART CATH AND CORS/GRAFTS ANGIOGRAPHY N/A 04/27/2017   Procedure: LEFT HEART CATH AND CORS/GRAFTS ANGIOGRAPHY;  Surgeon: Leonie Man, MD;  Location: [place] CV LAB;  Service: Cardiovascular;  Laterality: N/A;   LEFT HEART CATHETERIZATION WITH CORONARY ANGIOGRAM N/A 06/22/2014   Procedure: LEFT HEART CATHETERIZATION WITH CORONARY ANGIOGRAM;  Surgeon: Troy Sine, MD;  Location: [facility] CATH LAB;  Service: Cardiovascular;  Laterality: N/A;   LOWER EXTREMITY ANGIOGRAM Left 12/11/2013   Procedure: LOWER EXTREMITY ANGIOGRAM;  Surgeon: Lorretta Harp, MD;  Location: [facility] CATH LAB;  Service: Cardiovascular;  Laterality: Left;   LUMBAR LAMINECTOMY/DECOMPRESSION MICRODISCECTOMY Right 07/03/2017   Procedure: MICRODISCECTOMY LUMBAR FIVE - SACRAL ONE RIGHT;  Surgeon: Consuella Lose, MD;  Location: [place];  Service: Neurosurgery;  Laterality: Right;   LUMBAR LAMINECTOMY/DECOMPRESSION MICRODISCECTOMY Right 10/19/2017   Procedure: MICRODISCECTOMY LUMBAR FIVE- SACRAL 1 ONE ;  Surgeon: Consuella Lose, MD;  Location: [place];  Service: Neurosurgery;  Laterality: Right;   TONSILLECTOMY AND ADENOIDECTOMY     WISDOM TOOTH EXTRACTION       reports that he has quit smoking. His smoking use included cigarettes. He has a 2.00 pack-year smoking history. He has never used smokeless tobacco. He reports that he does not drink alcohol or use drugs.  Allergies  Allergen Reactions   Kiwi Extract Itching, Swelling and Other (See Comments)    Lips and face swell- breathing not affected   Tape Other  (See Comments)    "Plastic" tape causes blisters!!    Family History  Problem Relation Age of Onset   Heart attack Sister        died @ 84   Cancer Mother        died @ 66; unknown type   Diabetes Brother        deceased   Cirrhosis Father        alcohol related   Diabetes Father    Esophageal cancer Neg Hx    Colon cancer Neg Hx    Pancreatic cancer Neg Hx    Stomach cancer Neg Hx     Prior to Admission medications   Medication Sig Start Date End Date Taking? Authorizing Provider  acetaminophen (TYLENOL) 500 MG tablet Take 1,000 mg by mouth 2 (two) times daily as needed for moderate pain or headache.   Yes [provider]  aspirin EC 81 MG tablet Take 1 tablet (81 mg total) by mouth daily. 05/28/12  Yes Weaver, Scott T, PA-C  atorvastatin (LIPITOR) 40 MG tablet Take 1 tablet (40 mg total) by mouth daily at 6 PM. 12/13/13  Yes Tarri Fuller  W, PA-C  calcium acetate (PHOSLO) 667 MG capsule Take 3 capsules (2,001 mg total) by mouth 3 (three) times daily with meals. Patient taking differently: Take 1,334 mg by mouth 2 (two) times daily with a meal.  10/09/15  Yes Short, Noah Delaine, MD  gabapentin (NEURONTIN) 100 MG capsule Take 100 mg by mouth 2 (two) times daily.    Yes [provider]  multivitamin (RENA-VIT) TABS tablet Take 1 tablet by mouth at bedtime. 06/23/14  Yes Domenic Polite, MD  pantoprazole (PROTONIX) 40 MG tablet Take 40 mg by mouth daily.   Yes [provider]  polyethylene glycol (MIRALAX / GLYCOLAX) packet Take 17 g by mouth daily as needed for moderate constipation. 03/22/17  Yes Aline August, MD  tamsulosin (FLOMAX) 0.4 MG CAPS capsule Take 0.4 mg by mouth daily. 05/23/18  Yes [provider]  cyclobenzaprine (FLEXERIL) 5 MG tablet Take 1 tablet (5 mg total) by mouth 3 (three) times daily as needed for muscle spasms. Patient not taking: Reported on 07/17/2018 02/17/18   Drenda Freeze, MD  HYDROcodone-acetaminophen  (NORCO/VICODIN) 5-325 MG tablet Take 1 tablet by mouth every 4 (four) hours as needed. Patient not taking: Reported on 07/17/2018 06/14/18   Suella Broad A, PA-C  nitroGLYCERIN (NITROSTAT) 0.4 MG SL tablet Place 1 tablet (0.4 mg total) under the tongue every 5 (five) minutes as needed for chest pain. Patient not taking: Reported on 07/17/2018 03/20/12   Reola Mosher    Physical Exam: Vitals:   07/17/18 1833 07/17/18 1850 07/17/18 1905 07/17/18 1906  BP:    (!) 180/85  Pulse: 76  70 67  Resp: 20 16 20 19   Temp:      TempSrc:      SpO2: 100%  100% 100%  Weight:      Height:        Constitutional: NAD, calm, comfortable Vitals:   07/17/18 1833 07/17/18 1850 07/17/18 1905 07/17/18 1906  BP:    (!) 180/85  Pulse: 76  70 67  Resp: 20 16 20 19   Temp:      TempSrc:      SpO2: 100%  100% 100%  Weight:      Height:       Eyes: PERRL, lids and conjunctivae normal ENMT: Mucous membranes are moist. Posterior pharynx clear of any exudate or lesions.Normal dentition.  Neck: normal, supple, no masses, no thyromegaly Respiratory: clear to auscultation bilaterally, no wheezing, no crackles. Normal respiratory effort. No accessory muscle use.  Cardiovascular: Blowing systolic murmur right sternal border PMI, carotid bruit otherwise without murmurs or gallops; left upper extremity fistula intact, bruit noted. Abdomen: no tenderness, no masses palpated. No hepatosplenomegaly. Bowel sounds positive.  Musculoskeletal: no clubbing / cyanosis. No joint deformity upper and lower extremities. Good ROM, no contractures. Normal muscle tone.  Chronic skinchanges over left upper extremity fistula Skin: no rashes, lesions, ulcers. No induration Neurologic: CN 2-12 grossly intact. Sensation intact, DTR normal. Strength 5/5 in all 4.  Psychiatric: Normal judgment and insight. Alert and oriented x 3. Normal mood.   Labs on Admission: I have personally reviewed following labs and imaging  studies  CBC: Recent Labs  Lab 07/12/18 0756 07/17/18 1621  WBC 6.6 7.3  NEUTROABS 3.4  --   HGB 12.6* 11.6*  HCT 39.9 37.8*  MCV 89.1 89.4  PLT 228 294   Basic Metabolic Panel: Recent Labs  Lab 07/12/18 0756 07/17/18 1621  NA 137 138  K 3.9 4.0  CL 93*  94*  CO2 27 26  GLUCOSE 77 106*  BUN 31* 30*  CREATININE 8.99* 9.70*  CALCIUM 9.4 9.6   GFR: Estimated Creatinine Clearance: 7.3 mL/min (A) (by C-G formula based on SCr of 9.7 mg/dL (H)). Liver Function Tests: No results for input(s): AST, ALT, ALKPHOS, BILITOT, PROT, ALBUMIN in the last 168 hours. No results for input(s): LIPASE, AMYLASE in the last 168 hours. No results for input(s): AMMONIA in the last 168 hours. Coagulation Profile: No results for input(s): INR, PROTIME in the last 168 hours. Cardiac Enzymes: Recent Labs  Lab 07/12/18 0756 07/12/18 1153 07/17/18 1621  TROPONINI 0.03* <0.03 0.04*   BNP (last 3 results) No results for input(s): PROBNP in the last 8760 hours. HbA1C: No results for input(s): HGBA1C in the last 72 hours. CBG: Recent Labs  Lab 07/12/18 0732 07/12/18 0842  GLUCAP 63* 122*   Lipid Profile: No results for input(s): CHOL, HDL, LDLCALC, TRIG, CHOLHDL, LDLDIRECT in the last 72 hours. Thyroid Function Tests: No results for input(s): TSH, T4TOTAL, FREET4, T3FREE, THYROIDAB in the last 72 hours. Anemia Panel: No results for input(s): VITAMINB12, FOLATE, FERRITIN, TIBC, IRON, RETICCTPCT in the last 72 hours. Urine analysis:    Component Value Date/Time   COLORURINE YELLOW 03/18/2017 1721   APPEARANCEUR CLEAR 03/18/2017 1721   LABSPEC 1.007 03/18/2017 1721   PHURINE 9.0 (H) 03/18/2017 1721   GLUCOSEU 150 (A) 03/18/2017 1721   HGBUR NEGATIVE 03/18/2017 1721   BILIRUBINUR NEGATIVE 03/18/2017 1721   KETONESUR NEGATIVE 03/18/2017 1721   PROTEINUR >=300 (A) 03/18/2017 1721   UROBILINOGEN 0.2 06/18/2014 2308   NITRITE NEGATIVE 03/18/2017 1721   LEUKOCYTESUR NEGATIVE 03/18/2017  1721    Radiological Exams on Admission: Dg Chest 2 View  Result Date: 07/17/2018 CLINICAL DATA:  Chest pain and shortness of breath. EXAM: CHEST - 2 VIEW COMPARISON:  Chest radiograph and chest CT July 12, 2018 FINDINGS: There is no appreciable edema or consolidation. Heart is upper normal in size with pulmonary vascularity normal. No adenopathy. There is aortic atherosclerosis. No bone lesions. IMPRESSION: No edema or consolidation. Heart upper normal in size. Aortic Atherosclerosis (ICD10-I70.0). Electronically Signed   By: Lowella Grip III M.D.   On: 07/17/2018 16:46   Ct Angio Chest/abd/pel For Dissection W And/or Wo Contrast  Result Date: 07/17/2018 CLINICAL DATA:  Shortness of breath and chest pain radiates to the back. EXAM: CT ANGIOGRAPHY CHEST, ABDOMEN AND PELVIS TECHNIQUE: Multidetector CT imaging through the chest, abdomen and pelvis was performed using the standard protocol during bolus administration of intravenous contrast. Multiplanar reconstructed images and MIPs were obtained and reviewed to evaluate the vascular anatomy. CONTRAST:  15mL OMNIPAQUE IOHEXOL 350 MG/ML SOLN COMPARISON:  CT chest 07/12/2018.  Abdomen and pelvis CT 04/23/2018. FINDINGS: CTA CHEST FINDINGS Cardiovascular: The heart size is normal. No substantial pericardial effusion. Coronary artery calcification is evident. Atherosclerotic calcification is noted in the wall of the thoracic aorta. Precontrast imaging shows no hyperdense crescent in the wall of the thoracic aorta is suggest acute intramural hematoma. Postcontrast arterial phase imaging shows no dissection flap in the thoracic aorta. There is no thoracic aortic aneurysm. Aortic arch vessel anatomy opacifies normally. No large central pulmonary embolus. Mediastinum/Nodes: No mediastinal lymphadenopathy. There is no hilar lymphadenopathy. The esophagus has normal imaging features. There is no axillary lymphadenopathy. Lungs/Pleura: The central tracheobronchial  airways are patent. No suspicious nodule or mass. No focal airspace consolidation. Dependent atelectasis bilaterally. No pleural effusion. Musculoskeletal: No worrisome lytic or sclerotic osseous abnormality. Review of  the MIP images confirms the above findings. CTA ABDOMEN AND PELVIS FINDINGS VASCULAR Aorta: No abdominal aortic aneurysm. No dissection flap in the abdominal aorta. Atherosclerotic calcification noted. Celiac: Widely patent. SMA: Widely patent. 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 Renals: Single left renal artery appears widely patent. Main and accessory right renal arteries appear diminutive but opacify normally. IMA: Patent. Inflow: Patent. Veins: Unopacified. Review of the MIP images confirms the above findings. NON-VASCULAR Hepatobiliary: No suspicious focal abnormality within the liver parenchyma. There is no evidence for gallstones, gallbladder wall thickening, or pericholecystic fluid. No intrahepatic or extrahepatic biliary dilation. Pancreas: No focal mass lesion. No dilatation of the main duct. No intraparenchymal cyst. No peripancreatic edema. Spleen: No splenomegaly. No focal mass lesion. Adrenals/Urinary Tract: Similar appearance of small bilateral adrenal nodules, measuring up to 17 mm. These are previously characterized as adenomas. Both kidneys are atrophic. No evidence of hydroureter. Bladder is nondistended. Stomach/Bowel: Proximal stomach is nondistended with somewhat prominent distention of the distal stomach. Duodenum is normally positioned as is the ligament of Treitz. Duodenal diverticulum evident. No small bowel wall thickening. No small bowel dilatation. No gross colonic mass. No colonic wall thickening. Lymphatic: There is no gastrohepatic or hepatoduodenal ligament lymphadenopathy. No intraperitoneal or retroperitoneal lymphadenopathy. No pelvic sidewall lymphadenopathy. Reproductive: Prostate gland incompletely visualized. Other: No intraperitoneal  free fluid. Musculoskeletal: Status post lumbosacral fusion. No worrisome lytic or sclerotic osseous abnormality. Similar appearance of degenerative endplate changes around L3-4. Review of the MIP images confirms the above findings. IMPRESSION: 1. No evidence for thoracoabdominal aortic aneurysm or dissection. No large central pulmonary embolus. 2. No findings to explain the patient's history of chest pain and shortness of breath. 3. Bilateral renal atrophy. 4.  Aortic Atherosclerois (ICD10-170.0) Electronically Signed   By: Misty Stanley M.D.   On: 07/17/2018 18:22    EKG: Independently reviewed.  Not overt ST elevation or depression  Assessment/Plan Principal Problem:   Chest pain, atypical Active Problems:   DM (diabetes mellitus) (HCC)   Hyperlipidemia LDL goal <70   Hypertension   Peripheral arterial disease (HCC)   PVD (peripheral vascular disease) (HCC)   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/[facility]   ESRD (end stage renal disease) on dialysis ([place])  Atypical chest pain rule out ACS versus GI etiology Extensive history of CAD/CABG/PVD/Carotid artery disease -His pain again is questionably epigastric versus truly chest pain  -CTA chest abdomen pelvis as above unremarkable for dissection -Heart score is 6 given risk factors age and history -Follow troponin, repeat EKG -Consider cardiology follow-up in the morning, patient follows with Dr. Alvester Chou locally report but also sees Dr. Kennith [facility], remains unclear if he truly is following cardiology as closely as  -Notable proximal LAD lesion at 75% in January 2019 last known cardiac catheterization per records; and status post CABG 2018 [facility] -If troponin remains negative,and EKG unremarkable could consider further work-up in the outpatient setting with both cardiology and GI given atypical symptoms/location of complaint  Non-insulin-dependent diabetes type 2 -Patient reports this is now diet-controlled, continue diabetic/renal diet -Continue  sliding scale insulin, hypoglycemic protocol  Chronic anemia of chronic disease, at baseline secondary to ESRD as below  -Follow morning labs, no acute issues or signs of bleeding  Hypertension -Moderately poorly controlled as above blood pressure most recently 086 systolic, will resume home medications follow overnight -Still uncontrolled in the morning would defer to cardiology/nephrology for medication changes  Hyperlipidemia -Continue home meds once med rec verified  ESRD on  dialysis Monday Wednesday Friday via left upper extremity fistula -Questionably compliant, patient's creatinine today 12.6, BUN 47 -Indicates he has been compliant with dialysis -Defer to nephrology -due for dialysis on Friday if still in-house on April 10 would need to consult nephrology for in-house dialysis  DVT prophylaxis: SCDs Code Status: Full Family Communication: None present Disposition Plan: Observation, likely disposition home tomorrow in clinical status and evaluation Consults called: None Admission status: Observation   Little Ishikawa DO [facility]  If 7PM-7AM, please contact night-coverage [email or web address] Password [facility]  07/17/2018, 7:39 PM

## 2018-07-17 NOTE — ED Notes (Signed)
Patient transported to X-ray 

## 2018-07-17 NOTE — ED Provider Notes (Signed)
[facility] EMERGENCY DEPARTMENT Provider Note   CSN: [id number] Arrival date & time: 07/17/18  1606    History   Chief Complaint Chief Complaint  Patient presents with  . Shortness of Breath  . Chest Pain    HPI Jon Anderson is a 68 y.o. male.     HPI  5AM began to develop shortness of breath, sharp epigastric pain, nausea, radiates towards LUQ  and around to the back No vomiting but feels like needs to Feels like shortness of breath is from pain Was here last Friday for the same thing Standing up sometimes helps pain Worse with breathing Not worse with eating, if stands up helps.  Had dialysis Monday but didn't feel well enough to go today   Past Medical History:  Diagnosis Date  . Anemia of chronic disease   . CAD (coronary artery disease)    a.  Myoview 4/11: EF 53%, no scar or ischemia   c. MV 2012 Nl perfusion, apical thinning.  No ischemia or scar.  EF 49%, appears greater by visual estimate.;  d.  Dob stress echo 12/13:  Negative Dob stress echo. There is no evidence of ischemia.  The LVF is normal. b. Normal cors 2016.  . Carotid stenosis    a. <78% RICA, >29% LICA by duplex 08/6211  . Chronic chest pain    occ  . ESRD (end stage renal disease) on dialysis ([place])    M-W-F  . GERD (gastroesophageal reflux disease)   . HNP (herniated nucleus pulposus), lumbar   . HTN (hypertension)    echo 3/10: EF 60%, LAE  . Hyperlipidemia   . Nephrolithiasis    "passed them all"  . Peripheral arterial disease ([place])    a. s/p PTCA to L SFA.  Marland Kitchen Pneumonia   . Restless legs   . Sleep apnea    no cpap, needs to reschedule appointment to set up aquiring cpap  . Snores    a. presumed OSA, pt has refused sleep eval in past.  . Type II diabetes mellitus ([place])    no longer on medications, checks blood glucose at home    Patient Active Problem List   Diagnosis Date Noted  . Chest pain, atypical 07/17/2018  . 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 GERD (gastroesophageal reflux disease) 02/28/2018  . Nephrolithiasis 02/28/2018  . Sciatic leg pain 02/28/2018  . Snores 02/28/2018  . Orthopnea 02/23/2018  . Elevated troponin 02/23/2018  . HNP (herniated nucleus pulposus), lumbar 07/03/2017  . Chest pain in adult 04/27/2017  . Restless leg syndrome, uncontrolled 04/27/2017  . 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 Anemia of chronic disease 03/17/2017  . Severe uncontrolled hypertension 03/17/2017  . ESRD (end stage renal disease) on dialysis ([place]) 03/17/2017  . Hyperlipidemia 03/17/2017  . Chronic chest pain 03/17/2017  . Type II diabetes mellitus ([place]) 03/17/2017  . Acute on chronic diastolic heart failure ([place]) 03/17/2017  . Acute heart failure ([place]) 03/17/2017  . Hemodialysis status ([place]) 01/25/2017  . Lumbar radiculopathy 01/16/2017  . Pre-transplant evaluation for kidney transplant 06/15/2016  . Increased frequency of urination 12/17/2015  . Nocturia 12/17/2015  . Chest pain, non-cardiac 10/09/2015  . Acute on chronic renal failure ([place]) 06/16/2014  . Renal failure (ARF), acute on chronic (HCC) 06/16/2014  . Shoulder pain, left 12/15/2013  . Chest pain 12/15/2013  . Claudication ([place]) 12/11/2013  . PVD (peripheral vascular disease) ([place]) 12/11/2013  . Peripheral arterial disease (  [place]) 09/30/2013  . Carotid artery disease ([place]) 09/30/2013  . Acute chest pain 11/15/2012  . Left-sided chest wall pain 03/19/2012  . Bruit 09/15/2010  . CAD (coronary artery disease) nonobstructive per cath 2012   . Precordial chest pain 07/26/2009  . DM (diabetes mellitus) ([place]) 07/22/2009  . Hyperlipidemia LDL goal <70 07/22/2009  . Hypertension 07/22/2009    Past Surgical History:  Procedure Laterality Date  . ANGIOPLASTY / STENTING FEMORAL Left 12/11/2013   dr berry  . AV FISTULA PLACEMENT Left 03/19/2014   Procedure: CREATION OF ARTERIOVENOUS (AV) FISTULA  LEFT UPPER ARM;  Surgeon: Mal Misty, MD;  Location: [facility];  Service: Vascular;  Laterality: Left;  . CARDIAC CATHETERIZATION  2001 and 2010   . COLONOSCOPY W/ BIOPSIES AND POLYPECTOMY    . COLONOSCOPY WITH PROPOFOL N/A 08/01/2016   Procedure: COLONOSCOPY WITH PROPOFOL;  Surgeon: Carol Ada, MD;  Location: [facility];  Service: Endoscopy;  Laterality: N/A;  . CORONARY ARTERY BYPASS GRAFT  2018  . ESOPHAGOGASTRODUODENOSCOPY (EGD) WITH PROPOFOL N/A 08/01/2016   Procedure: ESOPHAGOGASTRODUODENOSCOPY (EGD) WITH PROPOFOL;  Surgeon: Carol Ada, MD;  Location: [facility];  Service: Endoscopy;  Laterality: N/A;  . FOOT FRACTURE SURGERY Right    ligament repair  . FRACTURE SURGERY    . INGUINAL HERNIA REPAIR Left   . LEFT HEART CATH AND CORS/GRAFTS ANGIOGRAPHY N/A 04/27/2017   Procedure: LEFT HEART CATH AND CORS/GRAFTS ANGIOGRAPHY;  Surgeon: Leonie Man, MD;  Location: [facility];  Service: Cardiovascular;  Laterality: N/A;  . LEFT HEART CATHETERIZATION WITH CORONARY ANGIOGRAM N/A 06/22/2014   Procedure: LEFT HEART CATHETERIZATION WITH CORONARY ANGIOGRAM;  Surgeon: Troy Sine, MD;  Location: [facility] C[facility] LAB;  Service: Cardiovascular;  Laterality: N/A;  . LOWER EXTREMITY ANGIOGRAM Left 12/11/2013   Procedure: LOWER EXTREMITY ANGIOGRAM;  Surgeon: Lorretta Harp, MD;  Location: [facility] CATH LAB;  Service: Cardiovascular;  Laterality: Left;  . LUMBAR LAMINECTOMY/DECOMPRESSION MICRODISCECTOMY Right 07/03/2017   Procedure: MICRODISCECTOMY LUMBAR FIVE - SACRAL ONE RIGHT;  Surgeon: Consuella Lose, MD;  Location: [facility];  Service: Neurosurgery;  Laterality: Right;  . LUMBAR LAMINECTOMY/DECOMPRESSION MICRODISCECTOMY Right 10/19/2017   Procedure: MICRODISCECTOMY LUMBAR FIVE- SACRAL 1 ONE ;  Surgeon: Consuella Lose, MD;  Location: [facility];  Service: Neurosurgery;  Laterality: Right;  . TONSILLECTOMY AND ADENOIDECTOMY    . WISDOM TOOTH EXTRACTION          Home Medications    Prior to Admission medications   Medication  Sig Start Date End Date Taking? Authorizing Provider  acetaminophen (TYLENOL) 500 MG tablet Take 1,000 mg by mouth 2 (two) times daily as needed for moderate pain or headache.   Yes [provider]  aspirin EC 81 MG tablet Take 1 tablet (81 mg total) by mouth daily. 05/28/12  Yes Weaver, Scott T, PA-C  atorvastatin (LIPITOR) 40 MG tablet Take 1 tablet (40 mg total) by mouth daily at 6 PM. 12/13/13  Yes Brett Canales, PA-C  calcium acetate (PHOSLO) 667 MG capsule Take 3 capsules (2,001 mg total) by mouth 3 (three) times daily with meals. Patient taking differently: Take 1,334 mg by mouth 2 (two) times daily with a meal.  10/09/15  Yes Short, Noah Delaine, MD  gabapentin (NEURONTIN) 100 MG capsule Take 100 mg by mouth 2 (two) times daily.    Yes [provider]  multivitamin (RENA-VIT) TABS tablet Take 1 tablet by mouth at bedtime. 06/23/14  Yes Domenic Polite, MD  pantoprazole (PROTONIX) 40 MG tablet  Take 40 mg by mouth daily.   Yes [provider]  polyethylene glycol (MIRALAX / GLYCOLAX) packet Take 17 g by mouth daily as needed for moderate constipation. 03/22/17  Yes Aline August, MD  tamsulosin (FLOMAX) 0.4 MG CAPS capsule Take 0.4 mg by mouth daily. 05/23/18  Yes [provider]  cyclobenzaprine (FLEXERIL) 5 MG tablet Take 1 tablet (5 mg total) by mouth 3 (three) times daily as needed for muscle spasms. Patient not taking: Reported on 07/17/2018 02/17/18   Drenda Freeze, MD  HYDROcodone-acetaminophen (NORCO/VICODIN) 5-325 MG tablet Take 1 tablet by mouth every 4 (four) hours as needed. Patient not taking: Reported on 07/17/2018 06/14/18   Suella Broad A, PA-C  nitroGLYCERIN (NITROSTAT) 0.4 MG SL tablet Place 1 tablet (0.4 mg total) under the tongue every 5 (five) minutes as needed for chest pain. Patient not taking: Reported on 07/17/2018 03/20/12   Barrett, Evelene Croon, PA-C    Family History Family History  Problem Relation Age of Onset  . Heart attack Sister         died @ 43  . Cancer Mother        died @ 55; unknown type  . Diabetes Brother        deceased  . Cirrhosis Father        alcohol related  . Diabetes Father   . Esophageal cancer Neg Hx   . Colon cancer Neg Hx   . Pancreatic cancer Neg Hx   . Stomach cancer Neg Hx     Social History Social History   Tobacco Use  . Smoking status: Former Smoker    Packs/day: 1.00    Years: 2.00    Pack years: 2.00    Types: Cigarettes  . Smokeless tobacco: Never Used  . Tobacco comment: quit smoking 40 yrs ago  Substance Use Topics  . Alcohol use: No    Alcohol/week: 0.0 standard drinks  . Drug use: No     Allergies   Kiwi extract and Tape   Review of Systems Review of Systems  Constitutional: Negative for fever.  HENT: Positive for rhinorrhea. Negative for sore throat.   Eyes: Negative for visual disturbance.  Respiratory: Positive for shortness of breath. Negative for cough.   Cardiovascular: Positive for chest pain.  Gastrointestinal: Positive for abdominal pain and nausea. Negative for constipation, diarrhea and vomiting.  Genitourinary: Negative for difficulty urinating (makes very little).  Musculoskeletal: Negative for back pain and neck stiffness.  Skin: Negative for rash.  Neurological: Negative for syncope and headaches.     Physical Exam Updated Vital Signs BP (!) 149/76 (BP Location: Right Arm)   Pulse 73   Temp 97.7 F (36.5 C) (Oral)   Resp 17   Ht 5\' 11"  (1.803 m)   Wt 70.9 kg   SpO2 98%   BMI 21.80 kg/m   Physical Exam Vitals signs and nursing note reviewed.  Constitutional:      General: He is not in acute distress.    Appearance: He is well-developed. He is not diaphoretic.     Comments: anxious  HENT:     Head: Normocephalic and atraumatic.  Eyes:     Conjunctiva/sclera: Conjunctivae normal.  Neck:     Musculoskeletal: Normal range of motion.  Cardiovascular:     Rate and Rhythm: Normal rate and regular rhythm.     Heart sounds: Normal  heart sounds. No murmur. No friction rub. No gallop.   Pulmonary:     Effort:  Pulmonary effort is normal. No respiratory distress.     Breath sounds: Normal breath sounds. No wheezing or rales.  Abdominal:     General: There is no distension.     Palpations: Abdomen is soft.     Tenderness: There is no abdominal tenderness. There is no guarding.  Skin:    General: Skin is warm and dry.  Neurological:     Mental Status: He is alert and oriented to person, place, and time.      ED Treatments / Results  Labs (all labs ordered are listed, but only abnormal results are displayed) Labs Reviewed  BASIC METABOLIC PANEL - Abnormal; Notable for the following components:      Result Value   Chloride 94 (*)    Glucose, Bld 106 (*)    BUN 30 (*)    Creatinine, Ser 9.70 (*)    GFR calc non Af Amer 5 (*)    GFR calc Af Amer 6 (*)    Anion gap 18 (*)    All other components within normal limits  CBC - Abnormal; Notable for the following components:   Hemoglobin 11.6 (*)    HCT 37.8 (*)    RDW 15.9 (*)    All other components within normal limits  TROPONIN I - Abnormal; Notable for the following components:   Troponin I 0.04 (*)    All other components within normal limits  HEPATIC FUNCTION PANEL - Abnormal; Notable for the following components:   AST 14 (*)    All other components within normal limits  TROPONIN I - Abnormal; Notable for the following components:   Troponin I 0.03 (*)    All other components within normal limits  TROPONIN I - Abnormal; Notable for the following components:   Troponin I 0.03 (*)    All other components within normal limits  LIPASE, BLOOD  GLUCOSE, CAPILLARY  GLUCOSE, CAPILLARY  GLUCOSE, CAPILLARY    EKG EKG Interpretation  Date/Time:  Wednesday July 17 2018 16:13:34 EDT Ventricular Rate:  78 PR Interval:    QRS Duration: 95 QT Interval:  396 QTC Calculation: 452 R Axis:   22 Text Interpretation:  Sinus rhythm No significant change since  last tracing Confirmed by Gareth Morgan [phone]) on 07/17/2018 4:44:48 PM Also confirmed by Gareth Morgan [phone]), editor Philomena Doheny [phone])  on 07/18/2018 7:13:17 AM   Radiology Dg Chest 2 View  Result Date: 07/17/2018 CLINICAL DATA:  Chest pain and shortness of breath. EXAM: CHEST - 2 VIEW COMPARISON:  Chest radiograph and chest CT July 12, 2018 FINDINGS: There is no appreciable edema or consolidation. Heart is upper normal in size with pulmonary vascularity normal. No adenopathy. There is aortic atherosclerosis. No bone lesions. IMPRESSION: No edema or consolidation. Heart upper normal in size. Aortic Atherosclerosis (ICD10-I70.0). Electronically Signed   By: Lowella Grip III M.D.   On: 07/17/2018 16:46   Ct Angio Chest/abd/pel For Dissection W And/or Wo Contrast  Result Date: 07/17/2018 CLINICAL DATA:  Shortness of breath and chest pain radiates to the back. EXAM: CT ANGIOGRAPHY CHEST, ABDOMEN AND PELVIS TECHNIQUE: Multidetector CT imaging through the chest, abdomen and pelvis was performed using the standard protocol during bolus administration of intravenous contrast. Multiplanar reconstructed images and MIPs were obtained and reviewed to evaluate the vascular anatomy. CONTRAST:  153mL OMNIPAQUE IOHEXOL 350 MG/ML SOLN COMPARISON:  CT chest 07/12/2018.  Abdomen and pelvis CT 04/23/2018. FINDINGS: CTA CHEST FINDINGS Cardiovascular: The heart size is normal. No substantial pericardial effusion.  Coronary artery calcification is evident. Atherosclerotic calcification is noted in the wall of the thoracic aorta. Precontrast imaging shows no hyperdense crescent in the wall of the thoracic aorta is suggest acute intramural hematoma. Postcontrast arterial phase imaging shows no dissection flap in the thoracic aorta. There is no thoracic aortic aneurysm. Aortic arch vessel anatomy opacifies normally. No large central pulmonary embolus. Mediastinum/Nodes: No mediastinal lymphadenopathy. There is no hilar  lymphadenopathy. The esophagus has normal imaging features. There is no axillary lymphadenopathy. Lungs/Pleura: The central tracheobronchial airways are patent. No suspicious nodule or mass. No focal airspace consolidation. Dependent atelectasis bilaterally. No pleural effusion. Musculoskeletal: No worrisome lytic or sclerotic osseous abnormality. Review of the MIP images confirms the above findings. CTA ABDOMEN AND PELVIS FINDINGS VASCULAR Aorta: No abdominal aortic aneurysm. No dissection flap in the abdominal aorta. Atherosclerotic calcification noted. Celiac: Widely patent. SMA: Widely patent. 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 Renals: Single left renal artery appears widely patent. Main and accessory right renal arteries appear diminutive but opacify normally. IMA: Patent. Inflow: Patent. Veins: Unopacified. Review of the MIP images confirms the above findings. NON-VASCULAR Hepatobiliary: No suspicious focal abnormality within the liver parenchyma. There is no evidence for gallstones, gallbladder wall thickening, or pericholecystic fluid. No intrahepatic or extrahepatic biliary dilation. Pancreas: No focal mass lesion. No dilatation of the main duct. No intraparenchymal cyst. No peripancreatic edema. Spleen: No splenomegaly. No focal mass lesion. Adrenals/Urinary Tract: Similar appearance of small bilateral adrenal nodules, measuring up to 17 mm. These are previously characterized as adenomas. Both kidneys are atrophic. No evidence of hydroureter. Bladder is nondistended. Stomach/Bowel: Proximal stomach is nondistended with somewhat prominent distention of the distal stomach. Duodenum is normally positioned as is the ligament of Treitz. Duodenal diverticulum evident. No small bowel wall thickening. No small bowel dilatation. No gross colonic mass. No colonic wall thickening. Lymphatic: There is no gastrohepatic or hepatoduodenal ligament lymphadenopathy. No intraperitoneal or  retroperitoneal lymphadenopathy. No pelvic sidewall lymphadenopathy. Reproductive: Prostate gland incompletely visualized. Other: No intraperitoneal free fluid. Musculoskeletal: Status post lumbosacral fusion. No worrisome lytic or sclerotic osseous abnormality. Similar appearance of degenerative endplate changes around L3-4. Review of the MIP images confirms the above findings. IMPRESSION: 1. No evidence for thoracoabdominal aortic aneurysm or dissection. No large central pulmonary embolus. 2. No findings to explain the patient's history of chest pain and shortness of breath. 3. Bilateral renal atrophy. 4.  Aortic Atherosclerois (ICD10-170.0) Electronically Signed   By: Misty Stanley M.D.   On: 07/17/2018 18:22    Procedures Procedures (including critical care time)  Medications Ordered in ED Medications  acetaminophen (TYLENOL) tablet 650 mg (has no administration in time range)  ondansetron (ZOFRAN) injection 4 mg (4 mg Intravenous Given 07/17/18 2310)  aspirin EC tablet 81 mg (81 mg Oral Given 07/18/18 0857)  atorvastatin (LIPITOR) tablet 40 mg (has no administration in time range)  calcium acetate (PHOSLO) capsule 2,001 mg (2,001 mg Oral Not Given 07/18/18 0857)  cyclobenzaprine (FLEXERIL) tablet 5 mg (has no administration in time range)  gabapentin (NEURONTIN) capsule 100 mg (100 mg Oral Given 07/18/18 0857)  HYDROcodone-acetaminophen (NORCO/VICODIN) 5-325 MG per tablet 1 tablet (1 tablet Oral Given 07/17/18 2114)  multivitamin (RENA-VIT) tablet 1 tablet (1 tablet Oral Given 07/17/18 2114)  polyethylene glycol (MIRALAX / GLYCOLAX) packet 17 g (has no administration in time range)  tamsulosin (FLOMAX) capsule 0.4 mg (0.4 mg Oral Given 07/18/18 0857)  pantoprazole (PROTONIX) EC tablet 40 mg (40 mg Oral Given 07/18/18 0857)  insulin aspart (novoLOG) injection 0-9 Units (0 Units Subcutaneous Not Given 07/18/18 0858)  insulin aspart (novoLOG) injection 0-5 Units (0 Units Subcutaneous Not Given 07/17/18 2256)   hydrALAZINE (APRESOLINE) injection 5 mg (has no administration in time range)  ondansetron (ZOFRAN) injection 4 mg (4 mg Intravenous Given 07/17/18 1705)  iohexol (OMNIPAQUE) 350 MG/ML injection 100 mL (100 mLs Intravenous Contrast Given 07/17/18 1727)     Initial Impression / Assessment and Plan / ED Course  I have reviewed the triage vital signs and the nursing notes.  Pertinent labs & imaging results that were available during my care of the patient were reviewed by me and considered in my medical decision making (see chart for details).        68 year old male with a history of coronary artery disease, carotid stenosis, ESRD on dialysis Monday Wednesday Friday with last dialysis on Monday, hypertension, hyperlipidemia, diabetes, peripheral vascular disease, who presents with concern for sharp epigastric chest pain, shortness of breath, and nausea.  Patient recently had an evaluation in the emergency department 5 days ago for similar symptoms, and at that time had PE study which was negative.  He had reported improvement of his symptoms, prior to return early this morning.  Differential diagnosis includes ACS, CHF, aortic dissection, pneumonia, viral infection, cholelithiasis, cholecystitis.  Chest x-ray shows no evidence of volume overload or pneumonia.  Given recent similar symptoms with negative PE study, have low suspicion for pulmonary embolus at this time.  It does appear that his shortness of breath is secondary to pain on history as well as exam, with waxing and waning dyspnea.  Given his description of sharp pain radiating to the back, ordered dissection study of the chest abdomen pelvis.   CT dissection study shows no acute findings in chest, abdomen or pelvis.  Troponin .04 in settin gof pt missing dialysis.  EKG without acute findings.  While chest pain is atypical, he is high risk with history of CAD,PVD,carotid artery disease,DM,htn,hlpd, reported similar types of symptoms prior  to having LIMA graft (was found in transplant work up but had CP/dyspnea prior) and given repeat visit with similar symptoms, feel admission for further evaluation of chest pain is appropriate.  Final Clinical Impressions(s) / ED Diagnoses   Final diagnoses:  Chest pain, unspecified type    ED Discharge Orders    None       Gareth Morgan, MD 07/18/18 1002

## 2018-07-17 NOTE — ED Notes (Signed)
Patient transported to CT 

## 2018-07-17 NOTE — ED Notes (Signed)
ED TO INPATIENT HANDOFF REPORT  ED Nurse Name and Phone #: Sherrine Maples [phone]  S Name/Age/Gender Jon Anderson 68 y.o. male Room/Bed: 027C/027C  Code Status   Code Status: Full Code  Home/SNF/Other Home Patient oriented to: self, place, time and situation Is this baseline? Yes   Triage Complete: Triage complete  Chief Complaint SOB  Triage Note Patient arrives POV c/o shortness of breath and central chest pain radiating into back onset of this morning. Patient is dialysis pt on M/W/F, did not feel well enough to go to treatment today. Last dialysis on Monday. Patient states he had same symptoms and seen here last Friday.    Allergies Allergies  Allergen Reactions  . Kiwi Extract Itching, Swelling and Other (See Comments)    Lips and face swell- breathing not affected  . Tape Other (See Comments)    "Plastic" tape causes blisters!!    Level of Care/Admitting Diagnosis ED Disposition    ED Disposition Condition [facility]: [place] [[id number]]  Level of Care: Progressive [102]  I expect the patient will be discharged within 24 hours: Yes  LOW acuity---Tx typically complete <24 hrs---ACUTE conditions typically can be evaluated <24 hours---LABS likely to return to acceptable levels <24 hours---IS near functional baseline---EXPECTED to return to current living arrangement---NOT newly hypoxic: Meets criteria for 5C-Observation unit  Diagnosis: Chest pain, atypical [829937]  Admitting Physician: Little Ishikawa [[id number]]  Attending Physician: Little Ishikawa [phone]  PT Class (Do Not Modify): Observation [104]  PT Acc Code (Do Not Modify): Observation [10022]       B Medical/Surgery History Past Medical History:  Diagnosis Date  . Anemia of chronic disease   . CAD (coronary artery disease)    a.  Myoview 4/11: EF 53%, no scar or ischemia   c. MV 2012 Nl perfusion, apical thinning.  No ischemia or scar.  EF 49%,  appears greater by visual estimate.;  d.  Dob stress echo 12/13:  Negative Dob stress echo. There is no evidence of ischemia.  The LVF is normal. b. Normal cors 2016.  . Carotid stenosis    a. <10% RICA, >25% LICA by duplex 11/5275  . Chronic chest pain    occ  . ESRD (end stage renal disease) on dialysis ([place])    M-W-F  . GERD (gastroesophageal reflux disease)   . HNP (herniated nucleus pulposus), lumbar   . HTN (hypertension)    echo 3/10: EF 60%, LAE  . Hyperlipidemia   . Nephrolithiasis    "passed them all"  . Peripheral arterial disease ([place])    a. s/p PTCA to L SFA.  Marland Kitchen Pneumonia   . Restless legs   . Sleep apnea    no cpap, needs to reschedule appointment to set up aquiring cpap  . Snores    a. presumed OSA, pt has refused sleep eval in past.  . Type II diabetes mellitus ([place])    no longer on medications, checks blood glucose at home   Past Surgical History:  Procedure Laterality Date  . ANGIOPLASTY / STENTING FEMORAL Left 12/11/2013   dr berry  . AV FISTULA PLACEMENT Left 03/19/2014   Procedure: CREATION OF ARTERIOVENOUS (AV) FISTULA  LEFT UPPER ARM;  Surgeon: Mal Misty, MD;  Location: [facility];  Service: Vascular;  Laterality: Left;  . CARDIAC CATHETERIZATION  2001 and 2010   . COLONOSCOPY W/ BIOPSIES AND POLYPECTOMY    . COLONOSCOPY WITH PROPOFOL N/A 08/01/2016  Procedure: COLONOSCOPY WITH PROPOFOL;  Surgeon: Carol Ada, MD;  Location: [facility];  Service: Endoscopy;  Laterality: N/A;  . CORONARY ARTERY BYPASS GRAFT  2018  . ESOPHAGOGASTRODUODENOSCOPY (EGD) WITH PROPOFOL N/A 08/01/2016   Procedure: ESOPHAGOGASTRODUODENOSCOPY (EGD) WITH PROPOFOL;  Surgeon: Carol Ada, MD;  Location: [facility];  Service: Endoscopy;  Laterality: N/A;  . FOOT FRACTURE SURGERY Right    ligament repair  . FRACTURE SURGERY    . INGUINAL HERNIA REPAIR Left   . LEFT HEART CATH AND CORS/GRAFTS ANGIOGRAPHY N/A 04/27/2017   Procedure: LEFT HEART CATH AND CORS/GRAFTS ANGIOGRAPHY;   Surgeon: Leonie Man, MD;  Location: [facility];  Service: Cardiovascular;  Laterality: N/A;  . LEFT HEART CATHETERIZATION WITH CORONARY ANGIOGRAM N/A 06/22/2014   Procedure: LEFT HEART CATHETERIZATION WITH CORONARY ANGIOGRAM;  Surgeon: Troy Sine, MD;  Location: [facility] CATH LAB;  Service: Cardiovascular;  Laterality: N/A;  . LOWER EXTREMITY ANGIOGRAM Left 12/11/2013   Procedure: LOWER EXTREMITY ANGIOGRAM;  Surgeon: Lorretta Harp, MD;  Location[facility] CATH LAB;  Service: Cardiovascular;  Laterality: Left;  . LUMBAR LAMINECTOMY/DECOMPRESSION MICRODISCECTOMY Right 07/03/2017   Procedure: MICRODISCECTOMY LUMBAR FIVE - SACRAL ONE RIGHT;  Surgeon: Consuella Lose, MD;  Location: [facility];  Service: Neurosurgery;  Laterality: Right;  . LUMBAR LAMINECTOMY/DECOMPRESSION MICRODISCECTOMY Right 10/19/2017   Procedure: MICRODISCECTOMY LUMBAR FIVE- SACRAL 1 ONE ;  Surgeon: Consuella Lose, MD;  Location: [facility];  Service: Neurosurgery;  Laterality: Right;  . TONSILLECTOMY AND ADENOIDECTOMY    . WISDOM TOOTH EXTRACTION       A IV Location/Drains/Wounds Patient Lines/Drains/Airways Status   Active Line/Drains/Airways    Name:   Placement date:   Placement time:   Site:   Days:   Peripheral IV 07/17/18 Right Antecubital   07/17/18    1618    Antecubital   less than 1   Fistula / Graft Left Upper arm Arteriovenous fistula   03/19/14    0824    Upper arm   1581   Incision (Closed) 03/14/18 Back Other (Comment)   03/14/18    1300     125          Intake/Output Last 24 hours No intake or output data in the 24 hours ending 07/17/18 1944  Labs/Imaging Results for orders placed or performed during the hospital encounter of 07/17/18 (from the past 48 hour(s))  Basic metabolic panel     Status: Abnormal   Collection Time: 07/17/18  4:21 PM  Result Value Ref Range   Sodium 138 135 - 145 mmol/L   Potassium 4.0 3.5 - 5.1 mmol/L   Chloride 94 (L) 98 - 111 mmol/L   CO2 26 22 - 32 mmol/L   Glucose,  Bld 106 (H) 70 - 99 mg/dL   BUN 30 (H) 8 - 23 mg/dL   Creatinine, Ser 9.70 (H) 0.61 - 1.24 mg/dL   Calcium 9.6 8.9 - 10.3 mg/dL   GFR calc non Af Amer 5 (L) >60 mL/min   GFR calc Af Amer 6 (L) >60 mL/min   Anion gap 18 (H) 5 - 15    Comment: Performed at [facility], [address] [place] [address]  CBC     Status: Abnormal   Collection Time: 07/17/18  4:21 PM  Result Value Ref Range   WBC 7.3 4.0 - 10.5 K/uL   RBC 4.23 4.22 - 5.81 MIL/uL   Hemoglobin 11.6 (L) 13.0 - 17.0 g/dL   HCT 37.8 (L) 39.0 -  52.0 %   MCV 89.4 80.0 - 100.0 fL   MCH 27.4 26.0 - 34.0 pg   MCHC 30.7 30.0 - 36.0 g/dL   RDW 15.9 (H) 11.5 - 15.5 %   Platelets 216 150 - 400 K/uL   nRBC 0.0 0.0 - 0.2 %    Comment: Performed at [facility] [address], [place], [place] [address]  Troponin I - ONCE - STAT     Status: Abnormal   Collection Time: 07/17/18  4:21 PM  Result Value Ref Range   Troponin I 0.04 (HH) <0.03 ng/mL    Comment: CRITICAL RESULT CALLED TO, READ BACK BY AND VERIFIED WITH: Ander Slade [id number] 07/17/2018 D BRADLEY Performed at [facility], [place] [address] [place], [place] [address]    Dg Chest 2 View  Result Date: 07/17/2018 CLINICAL DATA:  Chest pain and shortness of breath. EXAM: CHEST - 2 VIEW COMPARISON:  Chest radiograph and chest CT July 12, 2018 FINDINGS: There is no appreciable edema or consolidation. Heart is upper normal in size with pulmonary vascularity normal. No adenopathy. There is aortic atherosclerosis. No bone lesions. IMPRESSION: No edema or consolidation. Heart upper normal in size. Aortic Atherosclerosis (ICD10-I70.0). Electronically Signed   By: Lowella Grip III M.D.   On: 07/17/2018 16:46   Ct Angio Chest/abd/pel For Dissection W And/or Wo Contrast  Result Date: 07/17/2018 CLINICAL DATA:  Shortness of breath and chest pain radiates to the back. EXAM: CT ANGIOGRAPHY CHEST, ABDOMEN AND PELVIS TECHNIQUE: Multidetector CT imaging through the chest,  abdomen and pelvis was performed using the standard protocol during bolus administration of intravenous contrast. Multiplanar reconstructed images and MIPs were obtained and reviewed to evaluate the vascular anatomy. CONTRAST:  15mL OMNIPAQUE IOHEXOL 350 MG/ML SOLN COMPARISON:  CT chest 07/12/2018.  Abdomen and pelvis CT 04/23/2018. FINDINGS: CTA CHEST FINDINGS Cardiovascular: The heart size is normal. No substantial pericardial effusion. Coronary artery calcification is evident. Atherosclerotic calcification is noted in the wall of the thoracic aorta. Precontrast imaging shows no hyperdense crescent in the wall of the thoracic aorta is suggest acute intramural hematoma. Postcontrast arterial phase imaging shows no dissection flap in the thoracic aorta. There is no thoracic aortic aneurysm. Aortic arch vessel anatomy opacifies normally. No large central pulmonary embolus. Mediastinum/Nodes: No mediastinal lymphadenopathy. There is no hilar lymphadenopathy. The esophagus has normal imaging features. There is no axillary lymphadenopathy. Lungs/Pleura: The central tracheobronchial airways are patent. No suspicious nodule or mass. No focal airspace consolidation. Dependent atelectasis bilaterally. No pleural effusion. Musculoskeletal: No worrisome lytic or sclerotic osseous abnormality. Review of the MIP images confirms the above findings. CTA ABDOMEN AND PELVIS FINDINGS VASCULAR Aorta: No abdominal aortic aneurysm. No dissection flap in the abdominal aorta. Atherosclerotic calcification noted. Celiac: Widely patent. SMA: Widely patent. 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 Renals: Single left renal artery appears widely patent. Main and accessory right renal arteries appear diminutive but opacify normally. IMA: Patent. Inflow: Patent. Veins: Unopacified. Review of the MIP images confirms the above findings. NON-VASCULAR Hepatobiliary: No suspicious focal abnormality within the liver parenchyma.  There is no evidence for gallstones, gallbladder wall thickening, or pericholecystic fluid. No intrahepatic or extrahepatic biliary dilation. Pancreas: No focal mass lesion. No dilatation of the main duct. No intraparenchymal cyst. No peripancreatic edema. Spleen: No splenomegaly. No focal mass lesion. Adrenals/Urinary Tract: Similar appearance of small bilateral adrenal nodules, measuring up to 17 mm. These are previously characterized as adenomas. Both kidneys are atrophic.  No evidence of hydroureter. Bladder is nondistended. Stomach/Bowel: Proximal stomach is nondistended with somewhat prominent distention of the distal stomach. Duodenum is normally positioned as is the ligament of Treitz. Duodenal diverticulum evident. No small bowel wall thickening. No small bowel dilatation. No gross colonic mass. No colonic wall thickening. Lymphatic: There is no gastrohepatic or hepatoduodenal ligament lymphadenopathy. No intraperitoneal or retroperitoneal lymphadenopathy. No pelvic sidewall lymphadenopathy. Reproductive: Prostate gland incompletely visualized. Other: No intraperitoneal free fluid. Musculoskeletal: Status post lumbosacral fusion. No worrisome lytic or sclerotic osseous abnormality. Similar appearance of degenerative endplate changes around L3-4. Review of the MIP images confirms the above findings. IMPRESSION: 1. No evidence for thoracoabdominal aortic aneurysm or dissection. No large central pulmonary embolus. 2. No findings to explain the patient's history of chest pain and shortness of breath. 3. Bilateral renal atrophy. 4.  Aortic Atherosclerois (ICD10-170.0) Electronically Signed   By: Misty Stanley M.D.   On: 07/17/2018 18:22    Pending Labs Unresulted Labs (From admission, onward)    Start     Ordered   07/17/18 1651  Hepatic function panel  ONCE - STAT,   STAT     07/17/18 1650   07/17/18 1651  Lipase, blood  ONCE - STAT,   STAT     07/17/18 1650          Vitals/Pain Today's Vitals    07/17/18 1833 07/17/18 1850 07/17/18 1905 07/17/18 1906  BP:    (!) 180/85  Pulse: 76  70 67  Resp: 20 16 20 19   Temp:      TempSrc:      SpO2: 100%  100% 100%  Weight:      Height:      PainSc:        Isolation Precautions No active isolations  Medications Medications  acetaminophen (TYLENOL) tablet 650 mg (has no administration in time range)  ondansetron (ZOFRAN) injection 4 mg (has no administration in time range)  ondansetron (ZOFRAN) injection 4 mg (4 mg Intravenous Given 07/17/18 1705)  iohexol (OMNIPAQUE) 350 MG/ML injection 100 mL (100 mLs Intravenous Contrast Given 07/17/18 1727)    Mobility walks High fall risk   Focused Assessments Pulmonary Assessment Handoff:  Lung sounds: Bilateral Breath Sounds: Diminished L Breath Sounds: Diminished R Breath Sounds: Diminished O2 Device: Room Air        R Recommendations: See Admitting Provider Note  Report given to:   Additional Notes:

## 2018-07-17 NOTE — ED Notes (Signed)
Pt placed on 2L O2 for comfort. MD notified

## 2018-07-17 NOTE — ED Triage Notes (Signed)
Patient arrives POV c/o shortness of breath and central chest pain radiating into back onset of this morning. Patient is dialysis pt on M/W/F, did not feel well enough to go to treatment today. Last dialysis on Monday. Patient states he had same symptoms and seen here last Friday.

## 2018-07-18 DIAGNOSIS — Z992 Dependence on renal dialysis: Secondary | ICD-10-CM

## 2018-07-18 DIAGNOSIS — I739 Peripheral vascular disease, unspecified: Secondary | ICD-10-CM

## 2018-07-18 DIAGNOSIS — Z951 Presence of aortocoronary bypass graft: Secondary | ICD-10-CM

## 2018-07-18 DIAGNOSIS — I10 Essential (primary) hypertension: Secondary | ICD-10-CM

## 2018-07-18 DIAGNOSIS — I120 Hypertensive chronic kidney disease with stage 5 chronic kidney disease or end stage renal disease: Secondary | ICD-10-CM | POA: Diagnosis not present

## 2018-07-18 DIAGNOSIS — N186 End stage renal disease: Secondary | ICD-10-CM | POA: Diagnosis not present

## 2018-07-18 DIAGNOSIS — R079 Chest pain, unspecified: Secondary | ICD-10-CM | POA: Diagnosis not present

## 2018-07-18 DIAGNOSIS — R0789 Other chest pain: Secondary | ICD-10-CM | POA: Diagnosis not present

## 2018-07-18 DIAGNOSIS — E119 Type 2 diabetes mellitus without complications: Secondary | ICD-10-CM | POA: Diagnosis not present

## 2018-07-18 DIAGNOSIS — E785 Hyperlipidemia, unspecified: Secondary | ICD-10-CM

## 2018-07-18 LAB — GLUCOSE, CAPILLARY
Glucose-Capillary: 78 mg/dL (ref 70–99)
Glucose-Capillary: 78 mg/dL (ref 70–99)
Glucose-Capillary: 72 mg/dL (ref 70–99)
Glucose-Capillary: 103 mg/dL — ABNORMAL HIGH (ref 70–99)
Glucose-Capillary: 100 mg/dL — ABNORMAL HIGH (ref 70–99)

## 2018-07-18 LAB — LIPID PANEL
Cholesterol: 130 mg/dL (ref 0–200)
HDL: 47 mg/dL (ref >40)
LDL Cholesterol: 68 mg/dL (ref 0–99)
Total CHOL/HDL Ratio: 2.8 RATIO
Triglycerides: 74 mg/dL (ref <150)
VLDL: 15 mg/dL (ref 0–40)

## 2018-07-18 LAB — HEMOGLOBIN A1C
Hgb A1c MFr Bld: 6.5 % — ABNORMAL HIGH (ref 4.8–5.6)
Mean Plasma Glucose: 139.85 mg/dL

## 2018-07-18 LAB — TROPONIN I: Troponin I: 0.03 ng/mL (ref <0.03)

## 2018-07-18 MED ORDER — CHLORHEXIDINE GLUCONATE CLOTH 2 % EX PADS
6.0000 | MEDICATED_PAD | Freq: Every day | CUTANEOUS | Status: DC
  Administered 2018-07-19: 6 via TOPICAL

## 2018-07-18 MED ORDER — AMLODIPINE BESYLATE 5 MG PO TABS
5.0000 mg | ORAL_TABLET | Freq: Every day | ORAL | Status: DC
  Administered 2018-07-18 – 2018-07-19 (×2): 5 mg via ORAL
  Filled 2018-07-18 (×2): qty 1

## 2018-07-18 NOTE — Consult Note (Signed)
Reason for Consult: To manage dialysis and dialysis related needs Referring Physician: [name] is an 68 y.o. male with PMhx significant for T2DM, OSA, HTN, CAD s/p CABG in 2018, cerebrovascular disease, hyperlipidemia as well as ESRD, HD MWF at [facility]  He also recently suffered a fracture to his left arm.  He presented to the hospital on 4/8 with complaints of CP with some worrisome features like radiating left and associated with nausea.  He was seen by cardiology - felt pain was atypical, troponins flat- questioned whether he needed more aggressive fluid removal with HD.  Patient is not currently complaining of CP on O2-  He does seems compliant with HD- got to EDW on Monday with still high BP- thinks he has lost weight.  Did miss HD on Wednesday when presenting here with complaints.  There do not seem to be urgent needs for dialysis at present    Dialyzes at [facility]  EDW 69.5. MWF, 4 hours HD [place] 2/2, Dialyzer 180, Heparin yes- 5000 bolus. Access left AVF. Profile #2, calcitriol 1.25 mcg q tx, 2.5 parsabiv q tx, no current ESA as hgb was 13 as OP  Past Medical History:  Diagnosis Date  . Anemia of chronic disease   . CAD (coronary artery disease)    a.  Myoview 4/11: EF 53%, no scar or ischemia   c. MV 2012 Nl perfusion, apical thinning.  No ischemia or scar.  EF 49%, appears greater by visual estimate.;  d.  Dob stress echo 12/13:  Negative Dob stress echo. There is no evidence of ischemia.  The LVF is normal. b. Normal cors 2016.  . Carotid stenosis    a. <21% RICA, >30% LICA by duplex 11/6576  . Chronic chest pain    occ  . ESRD (end stage renal disease) on dialysis ([place])    M-W-F  . GERD (gastroesophageal reflux disease)   . HNP (herniated nucleus pulposus), lumbar   . HTN (hypertension)    echo 3/10: EF 60%, LAE  . Hyperlipidemia   . Nephrolithiasis    "passed them all"  . Peripheral arterial disease ([place])    a. s/p PTCA to L SFA.  Marland Kitchen Pneumonia   . Restless legs   .  Sleep apnea    no cpap, needs to reschedule appointment to set up aquiring cpap  . Snores    a. presumed OSA, pt has refused sleep eval in past.  . Type II diabetes mellitus ([place])    no longer on medications, checks blood glucose at home    Past Surgical History:  Procedure Laterality Date  . ANGIOPLASTY / STENTING FEMORAL Left 12/11/2013   dr berry  . AV FISTULA PLACEMENT Left 03/19/2014   Procedure: CREATION OF ARTERIOVENOUS (AV) FISTULA  LEFT UPPER ARM;  Surgeon: Mal Misty, MD;  Location: [facility];  Service: Vascular;  Laterality: Left;  . CARDIAC CATHETERIZATION  2001 and 2010   . COLONOSCOPY W/ BIOPSIES AND POLYPECTOMY    . COLONOSCOPY WITH PROPOFOL N/A 08/01/2016   Procedure: COLONOSCOPY WITH PROPOFOL;  Surgeon: Carol Ada, MD;  Location: [facility];  Service: Endoscopy;  Laterality: N/A;  . CORONARY ARTERY BYPASS GRAFT  2018  . ESOPHAGOGASTRODUODENOSCOPY (EGD) WITH PROPOFOL N/A 08/01/2016   Procedure: ESOPHAGOGASTRODUODENOSCOPY (EGD) WITH PROPOFOL;  Surgeon: Carol Ada, MD;  Location: [facility];  Service: Endoscopy;  Laterality: N/A;  . FOOT FRACTURE SURGERY Right    ligament repair  . FRACTURE SURGERY    . INGUINAL  HERNIA REPAIR Left   . LEFT HEART CATH AND CORS/GRAFTS ANGIOGRAPHY N/A 04/27/2017   Procedure: LEFT HEART CATH AND CORS/GRAFTS ANGIOGRAPHY;  Surgeon: Leonie Man, MD;  Location: [facility];  Service: Cardiovascular;  Laterality: N/A;  . LEFT HEART CATHETERIZATION WITH CORONARY ANGIOGRAM N/A 06/22/2014   Procedure: LEFT HEART CATHETERIZATION WITH CORONARY ANGIOGRAM;  Surgeon: Troy Sine, MD;  Location: [facility] CATH LAB;  Service: Cardiovascular;  Laterality: N/A;  . LOWER EXTREMITY ANGIOGRAM Left 12/11/2013   Procedure: LOWER EXTREMITY ANGIOGRAM;  Surgeon: Lorretta Harp, MD;  Location: [facility] CATH LAB;  Service: Cardiovascular;  Laterality: Left;  . LUMBAR LAMINECTOMY/DECOMPRESSION MICRODISCECTOMY Right 07/03/2017   Procedure: MICRODISCECTOMY LUMBAR  FIVE - SACRAL ONE RIGHT;  Surgeon: Consuella Lose, MD;  Location: [facility];  Service: Neurosurgery;  Laterality: Right;  . LUMBAR LAMINECTOMY/DECOMPRESSION MICRODISCECTOMY Right 10/19/2017   Procedure: MICRODISCECTOMY LUMBAR FIVE- SACRAL 1 ONE ;  Surgeon: Consuella Lose, MD;  Location: [facility];  Service: Neurosurgery;  Laterality: Right;  . TONSILLECTOMY AND ADENOIDECTOMY    . WISDOM TOOTH EXTRACTION      Family History  Problem Relation Age of Onset  . Heart attack Sister        died @ 76  . Cancer Mother        died @ 57; unknown type  . Diabetes Brother        deceased  . Cirrhosis Father        alcohol related  . Diabetes Father   . Esophageal cancer Neg Hx   . Colon cancer Neg Hx   . Pancreatic cancer Neg Hx   . Stomach cancer Neg Hx     Social History:  reports that he has quit smoking. His smoking use included cigarettes. He has a 2.00 pack-year smoking history. He has never used smokeless tobacco. He reports that he does not drink alcohol or use drugs.  Allergies:  Allergies  Allergen Reactions  . Kiwi Extract Itching, Swelling and Other (See Comments)    Lips and face swell- breathing not affected  . Tape Other (See Comments)    "Plastic" tape causes blisters!!    Medications: I have reviewed the patient's current medications.   Results for orders placed or performed during the hospital encounter of 07/17/18 (from the past 48 hour(s))  Basic metabolic panel     Status: Abnormal   Collection Time: 07/17/18  4:21 PM  Result Value Ref Range   Sodium 138 135 - 145 mmol/L   Potassium 4.0 3.5 - 5.1 mmol/L   Chloride 94 (L) 98 - 111 mmol/L   CO2 26 22 - 32 mmol/L   Glucose, Bld 106 (H) 70 - 99 mg/dL   BUN 30 (H) 8 - 23 mg/dL   Creatinine, Ser 9.70 (H) 0.61 - 1.24 mg/dL   Calcium 9.6 8.9 - 10.3 mg/dL   GFR calc non Af Amer 5 (L) >60 mL/min   GFR calc Af Amer 6 (L) >60 mL/min   Anion gap 18 (H) 5 - 15    Comment: Performed at [facility], [address], [place] [address]  CBC     Status: Abnormal   Collection Time: 07/17/18  4:21 PM  Result Value Ref Range   WBC 7.3 4.0 - 10.5 K/uL   RBC 4.23 4.22 - 5.81 MIL/uL   Hemoglobin 11.6 (L) 13.0 - 17.0 g/dL   HCT 37.8 (L) 39.0 - 52.0 %   MCV 89.4 80.0 - 100.0  fL   MCH 27.4 26.0 - 34.0 pg   MCHC 30.7 30.0 - 36.0 g/dL   RDW 15.9 (H) 11.5 - 15.5 %   Platelets 216 150 - 400 K/uL   nRBC 0.0 0.0 - 0.2 %    Comment: Performed at [facility] [address], [place], [place] [address]  Troponin I - ONCE - STAT     Status: Abnormal   Collection Time: 07/17/18  4:21 PM  Result Value Ref Range   Troponin I 0.04 (HH) <0.03 ng/mL    Comment: CRITICAL RESULT CALLED TO, READ BACK BY AND VERIFIED WITH: Ander Slade [id number] 07/17/2018 D BRADLEY Performed at [facility], [place] [address], [place], [place] [address]   Hepatic function panel     Status: Abnormal   Collection Time: 07/17/18  7:13 PM  Result Value Ref Range   Total Protein 7.2 6.5 - 8.1 g/dL   Albumin 3.5 3.5 - 5.0 g/dL   AST 14 (L) 15 - 41 U/L   ALT 14 0 - 44 U/L   Alkaline Phosphatase 72 38 - 126 U/L   Total Bilirubin 0.3 0.3 - 1.2 mg/dL   Bilirubin, Direct <0.1 0.0 - 0.2 mg/dL   Indirect Bilirubin NOT CALCULATED 0.3 - 0.9 mg/dL    Comment: Performed at [facility] [address] [place], [place] [address]  Lipase, blood     Status: None   Collection Time: 07/17/18  7:13 PM  Result Value Ref Range   Lipase 35 11 - 51 U/L    Comment: Performed at [facility] [address], [place], [place] [address]  Troponin I - Now Then Q6H     Status: Abnormal   Collection Time: 07/17/18  8:56 PM  Result Value Ref Range   Troponin I 0.03 (HH) <0.03 ng/mL    Comment: CRITICAL VALUE NOTED.  VALUE IS CONSISTENT WITH PREVIOUSLY REPORTED AND CALLED VALUE. Performed at [facility], [place] [address], [place],  [address]   Glucose, capillary     Status: None   Collection Time: 07/17/18 10:37 PM  Result Value  Ref Range   Glucose-Capillary 91 70 - 99 mg/dL  Troponin I - Now Then Q6H     Status: Abnormal   Collection Time: 07/18/18  2:18 AM  Result Value Ref Range   Troponin I 0.03 (HH) <0.03 ng/mL    Comment: CRITICAL VALUE NOTED.  VALUE IS CONSISTENT WITH PREVIOUSLY REPORTED AND CALLED VALUE. Performed at [facility], [place] [address] [place], [place] [address]   Glucose, capillary     Status: None   Collection Time: 07/18/18  8:02 AM  Result Value Ref Range   Glucose-Capillary 78 70 - 99 mg/dL  Glucose, capillary     Status: None   Collection Time: 07/18/18  8:43 AM  Result Value Ref Range   Glucose-Capillary 78 70 - 99 mg/dL  Glucose, capillary     Status: None   Collection Time: 07/18/18 11:37 AM  Result Value Ref Range   Glucose-Capillary 72 70 - 99 mg/dL  Hemoglobin A1c     Status: Abnormal   Collection Time: 07/18/18  2:14 PM  Result Value Ref Range   Hgb A1c MFr Bld 6.5 (H) 4.8 - 5.6 %    Comment: (NOTE) Pre diabetes:          5.7%-6.4% Diabetes:              >6.4%  Glycemic control for   <7.0% adults with diabetes    Mean Plasma Glucose 139.85 mg/dL    Comment: Performed at [facility] [address], [place], [place] [address]  Lipid panel     Status: None   Collection Time: 07/18/18  2:14 PM  Result Value Ref Range   Cholesterol 130 0 - 200 mg/dL   Triglycerides 74 <150 mg/dL   HDL 47 >40 mg/dL   Total CHOL/HDL Ratio 2.8 RATIO   VLDL 15 0 - 40 mg/dL   LDL Cholesterol 68 0 - 99 mg/dL    Comment:        Total Cholesterol/HDL:CHD Risk Coronary Heart Disease Risk Table                     Men   Women  1/2 Average Risk   3.4   3.3  Average Risk       5.0   4.4  2 X Average Risk   9.6   7.1  3 X Average Risk  23.4   11.0        Use the calculated Patient Ratio above and the CHD Risk Table to determine the patient's CHD Risk.        ATP III CLASSIFICATION (LDL):  <100     mg/dL   Optimal  100-129  mg/dL   Near or Above                    Optimal   130-159  mg/dL   Borderline  160-189  mg/dL   High  >190     mg/dL   Very High Performed at [facility] [address], [place], [place] [address]     Dg Chest 2 View  Result Date: 07/17/2018 CLINICAL DATA:  Chest pain and shortness of breath. EXAM: CHEST - 2 VIEW COMPARISON:  Chest radiograph and chest CT July 12, 2018 FINDINGS: There is no appreciable edema or consolidation. Heart is upper normal in size with pulmonary vascularity normal. No adenopathy. There is aortic atherosclerosis. No bone lesions. IMPRESSION: No edema or consolidation. Heart upper normal in size. Aortic Atherosclerosis (ICD10-I70.0). Electronically Signed   By: Lowella Grip III M.D.   On: 07/17/2018 16:46   Ct Angio Chest/abd/pel For Dissection W And/or Wo Contrast  Result Date: 07/17/2018 CLINICAL DATA:  Shortness of breath and chest pain radiates to the back. EXAM: CT ANGIOGRAPHY CHEST, ABDOMEN AND PELVIS TECHNIQUE: Multidetector CT imaging through the chest, abdomen and pelvis was performed using the standard protocol during bolus administration of intravenous contrast. Multiplanar reconstructed images and MIPs were obtained and reviewed to evaluate the vascular anatomy. CONTRAST:  119mL OMNIPAQUE IOHEXOL 350 MG/ML SOLN COMPARISON:  CT chest 07/12/2018.  Abdomen and pelvis CT 04/23/2018. FINDINGS: CTA CHEST FINDINGS Cardiovascular: The heart size is normal. No substantial pericardial effusion. Coronary artery calcification is evident. Atherosclerotic calcification is noted in the wall of the thoracic aorta. Precontrast imaging shows no hyperdense crescent in the wall of the thoracic aorta is suggest acute intramural hematoma. Postcontrast arterial phase imaging shows no dissection flap in the thoracic aorta. There is no thoracic aortic aneurysm. Aortic arch vessel anatomy opacifies normally. No large central pulmonary embolus. Mediastinum/Nodes: No mediastinal lymphadenopathy. There is no hilar lymphadenopathy. The  esophagus has normal imaging features. There is no axillary lymphadenopathy. Lungs/Pleura: The central tracheobronchial airways are patent. No suspicious nodule or mass. No focal airspace consolidation. Dependent atelectasis bilaterally. No pleural  effusion. Musculoskeletal: No worrisome lytic or sclerotic osseous abnormality. Review of the MIP images confirms the above findings. CTA ABDOMEN AND PELVIS FINDINGS VASCULAR Aorta: No abdominal aortic aneurysm. No dissection flap in the abdominal aorta. Atherosclerotic calcification noted. Celiac: Widely patent. SMA: Widely patent. 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 Renals: Single left renal artery appears widely patent. Main and accessory right renal arteries appear diminutive but opacify normally. IMA: Patent. Inflow: Patent. Veins: Unopacified. Review of the MIP images confirms the above findings. NON-VASCULAR Hepatobiliary: No suspicious focal abnormality within the liver parenchyma. There is no evidence for gallstones, gallbladder wall thickening, or pericholecystic fluid. No intrahepatic or extrahepatic biliary dilation. Pancreas: No focal mass lesion. No dilatation of the main duct. No intraparenchymal cyst. No peripancreatic edema. Spleen: No splenomegaly. No focal mass lesion. Adrenals/Urinary Tract: Similar appearance of small bilateral adrenal nodules, measuring up to 17 mm. These are previously characterized as adenomas. Both kidneys are atrophic. No evidence of hydroureter. Bladder is nondistended. Stomach/Bowel: Proximal stomach is nondistended with somewhat prominent distention of the distal stomach. Duodenum is normally positioned as is the ligament of Treitz. Duodenal diverticulum evident. No small bowel wall thickening. No small bowel dilatation. No gross colonic mass. No colonic wall thickening. Lymphatic: There is no gastrohepatic or hepatoduodenal ligament lymphadenopathy. No intraperitoneal or retroperitoneal  lymphadenopathy. No pelvic sidewall lymphadenopathy. Reproductive: Prostate gland incompletely visualized. Other: No intraperitoneal free fluid. Musculoskeletal: Status post lumbosacral fusion. No worrisome lytic or sclerotic osseous abnormality. Similar appearance of degenerative endplate changes around L3-4. Review of the MIP images confirms the above findings. IMPRESSION: 1. No evidence for thoracoabdominal aortic aneurysm or dissection. No large central pulmonary embolus. 2. No findings to explain the patient's history of chest pain and shortness of breath. 3. Bilateral renal atrophy. 4.  Aortic Atherosclerois (ICD10-170.0) Electronically Signed   By: Misty Stanley M.D.   On: 07/17/2018 18:22    ROS: no current complaints except for left forearm pain associate with a fracture and cast Blood pressure 138/76, pulse 70, temperature 97.6 F (36.4 C), temperature source Oral, resp. rate 17, height 5\' 11"  (1.803 m), weight 70.9 kg, SpO2 100 %. General appearance: alert and no distress Resp: clear to auscultation bilaterally Cardio: regular rate and rhythm, S1, S2 normal, no murmur, click, rub or gallop GI: soft, non-tender; bowel sounds normal; no masses,  no organomegaly Extremities: extremities normal, atraumatic, no cyanosis or edema left forearm with cast, then left upper arm AVF- good thrill and bruit   Assessment/Plan: 68 year old BM with ESRD, DM, HTN and vascular disease.  He presents with CP which is felt to be atypical  1 CP-  troponins OK- cards eval , no further work up recommended.  Gently suggesting that volume might be responsible for CP 2 ESRD: normally MWF via [facility].  Missed treatment on Wednesday but no absolute need for HD today, will plan tomorrow on schedule but aggressive UF 3 Hypertension: BP fine on norvasc 5 and flomax, will attempt more aggressive UF tomorrow- probably need lower EDW at discharge   4. Anemia of ESRD: right now as OP on iron only,  no ESA and last hgb was  13- no ESA for now 5. Metabolic Bone Disease: will continue OP calcitriol, phoslo - cannot give parsabiv 6.DM- per primary   Louis Meckel 07/18/2018, 4:24 PM

## 2018-07-18 NOTE — Care Management Obs Status (Signed)
[facility] NOTIFICATION   Patient Details  Name: ZYAN COBY MRN: [id number] Date of Birth: Jan 28, 1951   Medicare Observation Status Notification Given:  Yes    Carles Collet, RN 07/18/2018, 11:15 AM

## 2018-07-18 NOTE — Progress Notes (Signed)
PROGRESS NOTE    Jon Anderson  DJS:[id number] DOB: Dec 17, 1950 DOA: 07/17/2018 PCP: [name], MD   Brief Narrative:  68 y.o. BM PMHx ESRD on HD  via left fistula M/W/F, non-insulin-dependent diabetes type 2, OSA, CAD,  CABG 01/2017 at [facility] and multiple catheterizations, HTN, ED, HLD, anemia of chronic disease, GERD, hypertension, hyperlipidemia.    Patient presents with what appears to be an acute on chronic episode of chest pain.  Recently evaluated earlier this week for similar episode; he comes in again today complaining of epigastric/mid chest pain radiating left laterally with sharp pain without notable exacerbating or alleviating factors. He does note associated nausea without vomiting and questionable diaphoresis but has not measured for fever. This is not exacerbated by activity, position, dietary intake, nor is this alleviated by rest.  Patient is a moderately poor historian -unable to further classify his pain other than sharp.  Indicates his pain does radiate midsternally/epigastrically to the left lateral chest wall -but does not include or radiate towards the neck or down the left arm.   *Cardiac catheterization from 04/2017 reviewed. Proximal LAD lesion 75%. LIMA-LAD widely patent. LVEDP 0-3 mmHg. Non-obstructive disease in other coronary arteries.   *TTE from 03/2017 reviewed. LVEF=55%. Moderate LVH. Severe mitral annular calcification with mild mitral stenosis. Severe LA dilatation. Mild TR.      Subjective: 4/9A/O x4, currently negative CP, negative S OB, negative abdominal pain.  States he missed Wednesdays HD session because he did not feel well   Assessment & Plan:   Principal Problem:   Chest pain, atypical Active Problems:   DM (diabetes mellitus) ([place])   Hyperlipidemia LDL goal <70   Hypertension   Peripheral arterial disease ([place])   PVD (peripheral vascular disease) ([place])   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/[facility]   ESRD (end stage renal disease) on dialysis  ([place])   Atypical chest pain ACS vs GI etiology -HEART score= 6 given risk factors, age and history -Patient with extensive Hx CAD/CABG/PVD/CAD -Cardiology consulted cardiology does not believe a cardiology problem.  Possibly fluid overload which needs to be removed via HD (i.e. pulmonary venous congestion) -No further cardiology work-up required. -Drug panel pending  HTN - Poorly controlled upon admission SBP 180s -Not on BP medication at home - Start amlodipine 5 mg daily -Hydralazine PRN  Diabetes type 2 uncontrolled with complication - Hemoglobin A1c pending -Lipid panel pending - Diet controlled  HLD -  Lipitor 40 mg daily -Awaiting lipid panel  Anemia of chronic disease (hemoglobin baseline11.7-12.7)  -No overt signs of bleeding Recent Labs  Lab 07/12/18 0756 07/17/18 1621  HGB 12.6* 11.6*  -At baseline  ESRD M/W/F via LEFT upper extremity fistula -Per patient only HD session he missed was Wednesday this week secondary to not feeling well, however creatinine and BUN rather high for to missing only 1 session.  Creatinine 7/BUN 30    DVT prophylaxis: SCD Code Status: Full Family Communication: None Disposition Plan: Discharge on 4/10 following HD   Consultants:  Nephrology  Procedures/Significant Events:  None   I have personally reviewed and interpreted all radiology studies and my findings are as above.  VENTILATOR SETTINGS: None   Cultures None  Antimicrobials: None   Devices None   LINES / TUBES:  None    Continuous Infusions:   Objective: Vitals:   07/17/18 2025 07/17/18 2309 07/18/18 0601 07/18/18 0804  BP: (!) 180/90 (!) 159/89 140/81 (!) 149/76  Pulse: (!) 58  69 73  Resp:  14 17   Temp: 98.2 F (36.8 C)  98.2 F (36.8 C) 97.7 F (36.5 C)  TempSrc: Oral  Oral Oral  SpO2: 96% 99% 99% 98%  Weight:      Height:        Intake/Output Summary (Last 24 hours) at 07/18/2018 9629 Last data filed at 07/17/2018 2100 Gross per  24 hour  Intake 222 ml  Output --  Net 222 ml   Filed Weights   07/17/18 1616 07/17/18 2000  Weight: 71.2 kg 70.9 kg    Examination:  General: A/O x4 no acute respiratory distress, cachectic Eyes: negative scleral hemorrhage, negative anisocoria, negative icterus ENT: Negative Runny nose, negative gingival bleeding, Neck:  Negative scars, masses, torticollis, lymphadenopathy, JVD Lungs: Clear to auscultation bilaterally without wheezes or crackles Cardiovascular: Regular rate and rhythm without murmur gallop or rub normal S1 and S2 Abdomen: negative abdominal pain, nondistended, positive soft, bowel sounds, no rebound, no ascites, no appreciable mass Extremities: No significant cyanosis, clubbing, or edema bilateral lower extremities, LEFT upper arm well-developed fistula Skin: Negative rashes, lesions, ulcers Psychiatric:  Negative depression, negative anxiety, negative fatigue, negative mania  Central nervous system:  Cranial nerves II through XII intact, tongue/uvula midline, all extremities muscle strength 5/5, sensation intact throughout,  negative dysarthria, negative expressive aphasia, negative receptive aphasia.  .     Data Reviewed: Care during the described time interval was provided by me .  I have reviewed this patient's available data, including medical history, events of note, physical examination, and all test results as part of my evaluation.   CBC: Recent Labs  Lab 07/12/18 0756 07/17/18 1621  WBC 6.6 7.3  NEUTROABS 3.4  --   HGB 12.6* 11.6*  HCT 39.9 37.8*  MCV 89.1 89.4  PLT 228 528   Basic Metabolic Panel: Recent Labs  Lab 07/12/18 0756 07/17/18 1621  NA 137 138  K 3.9 4.0  CL 93* 94*  CO2 27 26  GLUCOSE 77 106*  BUN 31* 30*  CREATININE 8.99* 9.70*  CALCIUM 9.4 9.6   GFR: Estimated Creatinine Clearance: 7.3 mL/min (A) (by C-G formula based on SCr of 9.7 mg/dL (H)). Liver Function Tests: Recent Labs  Lab 07/17/18 1913  AST 14*  ALT 14   ALKPHOS 72  BILITOT 0.3  PROT 7.2  ALBUMIN 3.5   Recent Labs  Lab 07/17/18 1913  LIPASE 35   No results for input(s): AMMONIA in the last 168 hours. Coagulation Profile: No results for input(s): INR, PROTIME in the last 168 hours. Cardiac Enzymes: Recent Labs  Lab 07/12/18 0756 07/12/18 1153 07/17/18 1621 07/17/18 2056 07/18/18 0218  TROPONINI 0.03* <0.03 0.04* 0.03* 0.03*   BNP (last 3 results) No results for input(s): PROBNP in the last 8760 hours. HbA1C: No results for input(s): HGBA1C in the last 72 hours. CBG: Recent Labs  Lab 07/12/18 0732 07/12/18 0842 07/17/18 2237 07/18/18 0802  GLUCAP 63* 122* 91 78   Lipid Profile: No results for input(s): CHOL, HDL, LDLCALC, TRIG, CHOLHDL, LDLDIRECT in the last 72 hours. Thyroid Function Tests: No results for input(s): TSH, T4TOTAL, FREET4, T3FREE, THYROIDAB in the last 72 hours. Anemia Panel: No results for input(s): VITAMINB12, FOLATE, FERRITIN, TIBC, IRON, RETICCTPCT in the last 72 hours. Urine analysis:    Component Value Date/Time   COLORURINE YELLOW 03/18/2017 1721   APPEARANCEUR CLEAR 03/18/2017 1721   LABSPEC 1.007 03/18/2017 1721   PHURINE 9.0 (H) 03/18/2017 1721   GLUCOSEU 150 (A) 03/18/2017 1721   HGBUR  NEGATIVE 03/18/2017 1721   BILIRUBINUR NEGATIVE 03/18/2017 1721   KETONESUR NEGATIVE 03/18/2017 1721   PROTEINUR >=300 (A) 03/18/2017 1721   UROBILINOGEN 0.2 06/18/2014 2308   NITRITE NEGATIVE 03/18/2017 1721   LEUKOCYTESUR NEGATIVE 03/18/2017 1721   Sepsis Labs: @LABRCNTIP (procalcitonin:4,lacticidven:4)  )No results found for this or any previous visit (from the past 240 hour(s)).       Radiology Studies: Dg Chest 2 View  Result Date: 07/17/2018 CLINICAL DATA:  Chest pain and shortness of breath. EXAM: CHEST - 2 VIEW COMPARISON:  Chest radiograph and chest CT July 12, 2018 FINDINGS: There is no appreciable edema or consolidation. Heart is upper normal in size with pulmonary vascularity  normal. No adenopathy. There is aortic atherosclerosis. No bone lesions. IMPRESSION: No edema or consolidation. Heart upper normal in size. Aortic Atherosclerosis (ICD10-I70.0). Electronically Signed   By: Lowella Grip III M.D.   On: 07/17/2018 16:46   Ct Angio Chest/abd/pel For Dissection W And/or Wo Contrast  Result Date: 07/17/2018 CLINICAL DATA:  Shortness of breath and chest pain radiates to the back. EXAM: CT ANGIOGRAPHY CHEST, ABDOMEN AND PELVIS TECHNIQUE: Multidetector CT imaging through the chest, abdomen and pelvis was performed using the standard protocol during bolus administration of intravenous contrast. Multiplanar reconstructed images and MIPs were obtained and reviewed to evaluate the vascular anatomy. CONTRAST:  148mL OMNIPAQUE IOHEXOL 350 MG/ML SOLN COMPARISON:  CT chest 07/12/2018.  Abdomen and pelvis CT 04/23/2018. FINDINGS: CTA CHEST FINDINGS Cardiovascular: The heart size is normal. No substantial pericardial effusion. Coronary artery calcification is evident. Atherosclerotic calcification is noted in the wall of the thoracic aorta. Precontrast imaging shows no hyperdense crescent in the wall of the thoracic aorta is suggest acute intramural hematoma. Postcontrast arterial phase imaging shows no dissection flap in the thoracic aorta. There is no thoracic aortic aneurysm. Aortic arch vessel anatomy opacifies normally. No large central pulmonary embolus. Mediastinum/Nodes: No mediastinal lymphadenopathy. There is no hilar lymphadenopathy. The esophagus has normal imaging features. There is no axillary lymphadenopathy. Lungs/Pleura: The central tracheobronchial airways are patent. No suspicious nodule or mass. No focal airspace consolidation. Dependent atelectasis bilaterally. No pleural effusion. Musculoskeletal: No worrisome lytic or sclerotic osseous abnormality. Review of the MIP images confirms the above findings. CTA ABDOMEN AND PELVIS FINDINGS VASCULAR Aorta: No abdominal aortic  aneurysm. No dissection flap in the abdominal aorta. Atherosclerotic calcification noted. Celiac: Widely patent. SMA: Widely patent. 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 Renals: Single left renal artery appears widely patent. Main and accessory right renal arteries appear diminutive but opacify normally. IMA: Patent. Inflow: Patent. Veins: Unopacified. Review of the MIP images confirms the above findings. NON-VASCULAR Hepatobiliary: No suspicious focal abnormality within the liver parenchyma. There is no evidence for gallstones, gallbladder wall thickening, or pericholecystic fluid. No intrahepatic or extrahepatic biliary dilation. Pancreas: No focal mass lesion. No dilatation of the main duct. No intraparenchymal cyst. No peripancreatic edema. Spleen: No splenomegaly. No focal mass lesion. Adrenals/Urinary Tract: Similar appearance of small bilateral adrenal nodules, measuring up to 17 mm. These are previously characterized as adenomas. Both kidneys are atrophic. No evidence of hydroureter. Bladder is nondistended. Stomach/Bowel: Proximal stomach is nondistended with somewhat prominent distention of the distal stomach. Duodenum is normally positioned as is the ligament of Treitz. Duodenal diverticulum evident. No small bowel wall thickening. No small bowel dilatation. No gross colonic mass. No colonic wall thickening. Lymphatic: There is no gastrohepatic or hepatoduodenal ligament lymphadenopathy. No intraperitoneal or retroperitoneal lymphadenopathy. No pelvic sidewall lymphadenopathy. Reproductive: Prostate  gland incompletely visualized. Other: No intraperitoneal free fluid. Musculoskeletal: Status post lumbosacral fusion. No worrisome lytic or sclerotic osseous abnormality. Similar appearance of degenerative endplate changes around L3-4. Review of the MIP images confirms the above findings. IMPRESSION: 1. No evidence for thoracoabdominal aortic aneurysm or dissection. No large central  pulmonary embolus. 2. No findings to explain the patient's history of chest pain and shortness of breath. 3. Bilateral renal atrophy. 4.  Aortic Atherosclerois (ICD10-170.0) Electronically Signed   By: Misty Stanley M.D.   On: 07/17/2018 18:22        Scheduled Meds:  aspirin EC  81 mg Oral Daily   atorvastatin  40 mg Oral q1800   calcium acetate  2,001 mg Oral TID WC   gabapentin  100 mg Oral BID   insulin aspart  0-5 Units Subcutaneous QHS   insulin aspart  0-9 Units Subcutaneous TID WC   multivitamin  1 tablet Oral QHS   pantoprazole  40 mg Oral Daily   tamsulosin  0.4 mg Oral Daily   Continuous Infusions:   LOS: 0 days    Time spent: 40 minutes     Shandon Matson, Geraldo Docker, MD [facility] Pager [phone]   If 7PM-7AM, please contact night-coverage [email or web address] Password [email or web address] 07/18/2018, 8:21 AM

## 2018-07-18 NOTE — TOC Initial Note (Signed)
Transition of Care [facility]) - Initial/Assessment Note    Patient Details  Name: Jon Anderson MRN: [id number] Date of Birth: [birth year]  [facility] CM/SW Contact:    Carles Collet, RN Phone Number: 07/18/2018, 11:09 AM  Clinical Narrative:           Damaris Schooner w patient on the phone. He states that he lives at home with his wife and daughter. He goes to HD, missed last session due to chest pain and he feels that if he went they have sent him to hospital and not done HD anyway. We discussed multiple admissions to hospital/ ED visits and benefits of having a HH RN to help prevent readmission. He states that he would like to use [facility] as he is familiar with them from past use, discussed [facility] ratings for [employer]. Referral made. Requested [place] orders from Dr Sherral Hammers.          Expected Discharge Plan: [place] Barriers to Discharge: Continued Medical Work up   Patient Goals and CMS Choice Patient states their goals for this hospitalization and ongoing recovery are:: to return home CMS Medicare.gov Compare Post Acute Care list provided to:: Patient Choice offered to / list presented to : Patient  Expected Discharge Plan and Services Expected Discharge Plan: [place]     Post Acute Care Choice: [place]: RN [facility] Agency: [place]  Prior Living Arrangements/Services   Lives with:: Spouse, Adult Children Patient language and need for interpreter reviewed:: Yes Do you feel safe going back to the place where you live?: Yes            Criminal Activity/Legal Involvement Pertinent to Current Situation/Hospitalization: No - Comment as needed  Activities of Daily Living      Permission Sought/Granted                  Emotional Assessment              Admission diagnosis:  SOB Patient Active Problem List   Diagnosis Date Noted  . Chest pain, atypical  07/17/2018  . DM neuropathy with neurologic complication ([place]) 62/95/2841  . GERD (gastroesophageal reflux disease) 02/28/2018  . Nephrolithiasis 02/28/2018  . Sciatic leg pain 02/28/2018  . Snores 02/28/2018  . Orthopnea 02/23/2018  . Elevated troponin 02/23/2018  . HNP (herniated nucleus pulposus), lumbar 07/03/2017  . Chest pain in adult 04/27/2017  . Restless leg syndrome, uncontrolled 04/27/2017  . 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 Anemia of chronic disease 03/17/2017  . Severe uncontrolled hypertension 03/17/2017  . ESRD (end stage renal disease) on dialysis ([place]) 03/17/2017  . Hyperlipidemia 03/17/2017  . Chronic chest pain 03/17/2017  . Type II diabetes mellitus ([place]) 03/17/2017  . Acute on chronic diastolic heart failure ([place]) 03/17/2017  . Acute heart failure ([place]) 03/17/2017  . Hemodialysis status ([place]) 01/25/2017  . Lumbar radiculopathy 01/16/2017  . Pre-transplant evaluation for kidney transplant 06/15/2016  . Increased frequency of urination 12/17/2015  . Nocturia 12/17/2015  . Chest pain, non-cardiac 10/09/2015  . Acute on chronic renal failure ([place]) 06/16/2014  . Renal failure (ARF), acute on chronic (HCC) 06/16/2014  . Shoulder pain, left 12/15/2013  . Chest pain 12/15/2013  . Claudication ([place]) 12/11/2013  . PVD (peripheral vascular disease) ([place]) 12/11/2013  .  Peripheral arterial disease ([place]) 09/30/2013  . Carotid artery disease ([place]) 09/30/2013  . Acute chest pain 11/15/2012  . Left-sided chest wall pain 03/19/2012  . Bruit 09/15/2010  . CAD (coronary artery disease) nonobstructive per cath 2012   . Precordial chest pain 07/26/2009  . DM (diabetes mellitus) ([place]) 07/22/2009  . Hyperlipidemia LDL goal <70 07/22/2009  . Hypertension 07/22/2009   PCP:  [name], MD Pharmacy:   [id number] - [place], [place] [address] [place] [address] Phone: [phone] Fax:  [phone]     Social Determinants of Health (SDOH) Interventions    Readmission Risk Interventions No flowsheet data found.

## 2018-07-18 NOTE — Consult Note (Signed)
CONSULTATION NOTE   Patient Name: Jon Anderson Date of Encounter: 07/18/2018 Cardiologist: [name], MD  Chief Complaint   Chest pain  Patient Profile   68 year old male with a history of coronary artery disease, found to have severe 75% long area of stenosis of the proximal to mid LAD during transplant work-up at [facility], ultimately underwent off-pump CABG with LIMA to LAD.  Now presents with atypical chest pain.  Troponins flat mild elevated, unchanged from prior findings.  Cardiology consulted for management recommendations.  HPI   Jon Anderson is a 68 y.o. male who is being seen today for the evaluation of chest pain and elevated troponin at the request of Dr. Sherral Hammers. This is a 68 year old male with a history of coronary artery disease.  He has been followed both at [facility] and by Dr. Alvester Chou.  He has end-stage renal disease on dialysis and underwent transplant work-up.  This demonstrated a severe 75% stenosis of the LAD and he underwent off-pump CABG with LIMA to LAD at [facility] in 2018.  Subsequently he had chest pain symptoms and underwent heart catheterization in January 2019 by Dr. Ellyn Hack, demonstrating a patent LIMA to LAD and otherwise noncritical coronary disease with normal LV function.  He presents now with epigastric/mid sternal chest pain radiating to the left which was described as sharp without any exacerbating or alleviating factors.  There was some associated nausea, but no fever, shortness of breath, cough or other concerning symptoms for COVID-19.  Troponins were trended and are flat mildly elevated around 0.3-0.4.  This is consistent with prior admissions.  He undergoes hemodialysis on Monday, Wednesdays and Fridays.  CTA of the chest, abdomen pelvis was unremarkable for dissection but did shows some distention of the distal stommach. He reports he has been struggling with constipation and food that seems to get stuck.  EKG shows sinus rhythm  at 70 with anteroseptal infarct pattern.  PMHx   Past Medical History:  Diagnosis Date   Anemia of chronic disease    CAD (coronary artery disease)    a.  Myoview 4/11: EF 53%, no scar or ischemia   c. MV 2012 Nl perfusion, apical thinning.  No ischemia or scar.  EF 49%, appears greater by visual estimate.;  d.  Dob stress echo 12/13:  Negative Dob stress echo. There is no evidence of ischemia.  The LVF is normal. b. Normal cors 2016.   Carotid stenosis    a. <24% RICA, >40% LICA by duplex 04/270   Chronic chest pain    occ   ESRD (end stage renal disease) on dialysis [facility])    M-W-F   GERD (gastroesophageal reflux disease)    HNP (herniated nucleus pulposus), lumbar    HTN (hypertension)    echo 3/10: EF 60%, LAE   Hyperlipidemia    Nephrolithiasis    "passed them all"   Peripheral arterial disease ([place])    a. s/p PTCA to L SFA.   Pneumonia    Restless legs    Sleep apnea    no cpap, needs to reschedule appointment to set up aquiring cpap   Snores    a. presumed OSA, pt has refused sleep eval in past.   Type II diabetes mellitus ([place])    no longer on medications, checks blood glucose at home    Past Surgical History:  Procedure Laterality Date   ANGIOPLASTY / STENTING FEMORAL Left 12/11/2013   dr berry   AV FISTULA PLACEMENT Left 03/19/2014  Procedure: CREATION OF ARTERIOVENOUS (AV) FISTULA  LEFT UPPER ARM;  Surgeon: Mal Misty, MD;  Location: [place];  Service: Vascular;  Laterality: Left;   CARDIAC CATHETERIZATION  2001 and 2010    COLONOSCOPY W/ BIOPSIES AND POLYPECTOMY     COLONOSCOPY WITH PROPOFOL N/A 08/01/2016   Procedure: COLONOSCOPY WITH PROPOFOL;  Surgeon: Carol Ada, MD;  Location: [facility];  Service: Endoscopy;  Laterality: N/A;   CORONARY ARTERY BYPASS GRAFT  2018   ESOPHAGOGASTRODUODENOSCOPY (EGD) WITH PROPOFOL N/A 08/01/2016   Procedure: ESOPHAGOGASTRODUODENOSCOPY (EGD) WITH PROPOFOL;  Surgeon: Carol Ada, MD;  Location: [facility]  ENDOSCOPY;  Service: Endoscopy;  Laterality: N/A;   FOOT FRACTURE SURGERY Right    ligament repair   FRACTURE SURGERY     INGUINAL HERNIA REPAIR Left    LEFT HEART CATH AND CORS/GRAFTS ANGIOGRAPHY N/A 04/27/2017   Procedure: LEFT HEART CATH AND CORS/GRAFTS ANGIOGRAPHY;  Surgeon: Leonie Man, MD;  Location: [place] CV [facility];  Service: Cardiovascular;  Laterality: N/A;   LEFT HEART CATHETERIZATION WITH CORONARY ANGIOGRAM N/A 06/22/2014   Procedure: LEFT HEART CATHETERIZATION WITH CORONARY ANGIOGRAM;  Surgeon: Troy Sine, MD;  Location: [facility] CATH LAB;  Service: Cardiovascular;  Laterality: N/A;   LOWER EXTREMITY ANGIOGRAM Left 12/11/2013   Procedure: LOWER EXTREMITY ANGIOGRAM;  Surgeon: Lorretta Harp, MD;  Location: [facility] CATH LAB;  Service: Cardiovascular;  Laterality: Left;   LUMBAR LAMINECTOMY/DECOMPRESSION MICRODISCECTOMY Right 07/03/2017   Procedure: MICRODISCECTOMY LUMBAR FIVE - SACRAL ONE RIGHT;  Surgeon: Consuella Lose, MD;  Location: [place];  Service: Neurosurgery;  Laterality: Right;   LUMBAR LAMINECTOMY/DECOMPRESSION MICRODISCECTOMY Right 10/19/2017   Procedure: MICRODISCECTOMY LUMBAR FIVE- SACRAL 1 ONE ;  Surgeon: Consuella Lose, MD;  Location: [place];  Service: Neurosurgery;  Laterality: Right;   TONSILLECTOMY AND ADENOIDECTOMY     WISDOM TOOTH EXTRACTION      FAMHx   Family History  Problem Relation Age of Onset   Heart attack Sister        died @ 39   Cancer Mother        died @ 9; unknown type   Diabetes Brother        deceased   Cirrhosis Father        alcohol related   Diabetes Father    Esophageal cancer Neg Hx    Colon cancer Neg Hx    Pancreatic cancer Neg Hx    Stomach cancer Neg Hx     SOCHx    reports that he has quit smoking. His smoking use included cigarettes. He has a 2.00 pack-year smoking history. He has never used smokeless tobacco. He reports that he does not drink alcohol or use drugs.  Outpatient Medications   No  current facility-administered medications on file prior to encounter.    Current Outpatient Medications on File Prior to Encounter  Medication Sig Dispense Refill   acetaminophen (TYLENOL) 500 MG tablet Take 1,000 mg by mouth 2 (two) times daily as needed for moderate pain or headache.     aspirin EC 81 MG tablet Take 1 tablet (81 mg total) by mouth daily.     atorvastatin (LIPITOR) 40 MG tablet Take 1 tablet (40 mg total) by mouth daily at 6 PM. 30 tablet 5   calcium acetate (PHOSLO) 667 MG capsule Take 3 capsules (2,001 mg total) by mouth 3 (three) times daily with meals. (Patient taking differently: Take 1,334 mg by mouth 2 (two) times daily with a meal. ) 270 capsule 0  gabapentin (NEURONTIN) 100 MG capsule Take 100 mg by mouth 2 (two) times daily.      multivitamin (RENA-VIT) TABS tablet Take 1 tablet by mouth at bedtime. 30 tablet 0   pantoprazole (PROTONIX) 40 MG tablet Take 40 mg by mouth daily.     polyethylene glycol (MIRALAX / GLYCOLAX) packet Take 17 g by mouth daily as needed for moderate constipation. 14 each 0   tamsulosin (FLOMAX) 0.4 MG CAPS capsule Take 0.4 mg by mouth daily.     cyclobenzaprine (FLEXERIL) 5 MG tablet Take 1 tablet (5 mg total) by mouth 3 (three) times daily as needed for muscle spasms. (Patient not taking: Reported on 07/17/2018) 10 tablet 0   HYDROcodone-acetaminophen (NORCO/VICODIN) 5-325 MG tablet Take 1 tablet by mouth every 4 (four) hours as needed. (Patient not taking: Reported on 07/17/2018) 10 tablet 0   nitroGLYCERIN (NITROSTAT) 0.4 MG SL tablet Place 1 tablet (0.4 mg total) under the tongue every 5 (five) minutes as needed for chest pain. (Patient not taking: Reported on 07/17/2018) 25 tablet 3    Inpatient Medications    Scheduled Meds:  aspirin EC  81 mg Oral Daily   atorvastatin  40 mg Oral q1800   calcium acetate  2,001 mg Oral TID WC   gabapentin  100 mg Oral BID   insulin aspart  0-5 Units Subcutaneous QHS   insulin aspart   0-9 Units Subcutaneous TID WC   multivitamin  1 tablet Oral QHS   pantoprazole  40 mg Oral Daily   tamsulosin  0.4 mg Oral Daily    Continuous Infusions:   PRN Meds: acetaminophen, cyclobenzaprine, hydrALAZINE, HYDROcodone-acetaminophen, ondansetron (ZOFRAN) IV, polyethylene glycol   ALLERGIES   Allergies  Allergen Reactions   Kiwi Extract Itching, Swelling and Other (See Comments)    Lips and face swell- breathing not affected   Tape Other (See Comments)    "Plastic" tape causes blisters!!    ROS   Pertinent items noted in HPI and remainder of comprehensive ROS otherwise negative.  Vitals   Vitals:   07/17/18 2025 07/17/18 2309 07/18/18 0601 07/18/18 0804  BP: (!) 180/90 (!) 159/89 140/81 (!) 149/76  Pulse: (!) 58  69 73  Resp:  14 17   Temp: 98.2 F (36.8 C)  98.2 F (36.8 C) 97.7 F (36.5 C)  TempSrc: Oral  Oral Oral  SpO2: 96% 99% 99% 98%  Weight:      Height:        Intake/Output Summary (Last 24 hours) at 07/18/2018 0849 Last data filed at 07/17/2018 2100 Gross per 24 hour  Intake 222 ml  Output --  Net 222 ml   Filed Weights   07/17/18 1616 07/17/18 2000  Weight: 71.2 kg 70.9 kg    Physical Exam   General appearance: alert and no distress Neck: no carotid bruit, no JVD and thyroid not enlarged, symmetric, no tenderness/mass/nodules Lungs: clear to auscultation bilaterally Heart: regular rate and rhythm Abdomen: soft, non-tender; bowel sounds normal; no masses,  no organomegaly Extremities: left forearm in a cast Pulses: 2+ and symmetric Skin: Skin color, texture, turgor normal. No rashes or lesions Neurologic: Grossly normal Psych: Pleasant  Labs   Results for orders placed or performed during the hospital encounter of 07/17/18 (from the past 48 hour(s))  Basic metabolic panel     Status: Abnormal   Collection Time: 07/17/18  4:21 PM  Result Value Ref Range   Sodium 138 135 - 145 mmol/L   Potassium 4.0 3.5 -  5.1 mmol/L   Chloride 94  (L) 98 - 111 mmol/L   CO2 26 22 - 32 mmol/L   Glucose, Bld 106 (H) 70 - 99 mg/dL   BUN 30 (H) 8 - 23 mg/dL   Creatinine, Ser 9.70 (H) 0.61 - 1.24 mg/dL   Calcium 9.6 8.9 - 10.3 mg/dL   GFR calc non Af Amer 5 (L) >60 mL/min   GFR calc Af Amer 6 (L) >60 mL/min   Anion gap 18 (H) 5 - 15    Comment: Performed at [facility] [address], [place], [place] [address]  CBC     Status: Abnormal   Collection Time: 07/17/18  4:21 PM  Result Value Ref Range   WBC 7.3 4.0 - 10.5 K/uL   RBC 4.23 4.22 - 5.81 MIL/uL   Hemoglobin 11.6 (L) 13.0 - 17.0 g/dL   HCT 37.8 (L) 39.0 - 52.0 %   MCV 89.4 80.0 - 100.0 fL   MCH 27.4 26.0 - 34.0 pg   MCHC 30.7 30.0 - 36.0 g/dL   RDW 15.9 (H) 11.5 - 15.5 %   Platelets 216 150 - 400 K/uL   nRBC 0.0 0.0 - 0.2 %    Comment: Performed at [facility] [address], [place], [place] [address]  Troponin I - ONCE - STAT     Status: Abnormal   Collection Time: 07/17/18  4:21 PM  Result Value Ref Range   Troponin I 0.04 (HH) <0.03 ng/mL    Comment: CRITICAL RESULT CALLED TO, READ BACK BY AND VERIFIED WITH: Ander Slade 8250 07/17/2018 D BRADLEY Performed at [facility], [place] [address] [place], [place] [address]   Hepatic function panel     Status: Abnormal   Collection Time: 07/17/18  7:13 PM  Result Value Ref Range   Total Protein 7.2 6.5 - 8.1 g/dL   Albumin 3.5 3.5 - 5.0 g/dL   AST 14 (L) 15 - 41 U/L   ALT 14 0 - 44 U/L   Alkaline Phosphatase 72 38 - 126 U/L   Total Bilirubin 0.3 0.3 - 1.2 mg/dL   Bilirubin, Direct <0.1 0.0 - 0.2 mg/dL   Indirect Bilirubin NOT CALCULATED 0.3 - 0.9 mg/dL    Comment: Performed at [facility] [address], [place], [place] [address]  Lipase, blood     Status: None   Collection Time: 07/17/18  7:13 PM  Result Value Ref Range   Lipase 35 11 - 51 U/L    Comment: Performed at [facility] [address] [place], [place] [address]  Troponin I - Now Then Q6H     Status: Abnormal    Collection Time: 07/17/18  8:56 PM  Result Value Ref Range   Troponin I 0.03 (HH) <0.03 ng/mL    Comment: CRITICAL VALUE NOTED.  VALUE IS CONSISTENT WITH PREVIOUSLY REPORTED AND CALLED VALUE. Performed at [facility], [place] [address], [place], [place] [address]   Glucose, capillary     Status: None   Collection Time: 07/17/18 10:37 PM  Result Value Ref Range   Glucose-Capillary 91 70 - 99 mg/dL  Troponin I - Now Then Q6H     Status: Abnormal   Collection Time: 07/18/18  2:18 AM  Result Value Ref Range   Troponin I 0.03 (HH) <0.03 ng/mL    Comment: CRITICAL VALUE NOTED.  VALUE IS CONSISTENT WITH PREVIOUSLY REPORTED AND CALLED VALUE. Performed at [facility], [place] [address] [place], [place] [address]   Glucose, capillary     Status: None   Collection Time: 07/18/18  8:02 AM  Result Value Ref Range   Glucose-Capillary 78 70 - 99 mg/dL  Glucose, capillary     Status: None   Collection Time: 07/18/18  8:43 AM  Result Value Ref Range   Glucose-Capillary 78 70 - 99 mg/dL    ECG   Normal sinus rhythm at 70, anteroseptal infarct pattern.- Personally Reviewed  Telemetry   Normal sinus rhythm- Personally Reviewed  Radiology   Dg Chest 2 View  Result Date: 07/17/2018 CLINICAL DATA:  Chest pain and shortness of breath. EXAM: CHEST - 2 VIEW COMPARISON:  Chest radiograph and chest CT July 12, 2018 FINDINGS: There is no appreciable edema or consolidation. Heart is upper normal in size with pulmonary vascularity normal. No adenopathy. There is aortic atherosclerosis. No bone lesions. IMPRESSION: No edema or consolidation. Heart upper normal in size. Aortic Atherosclerosis (ICD10-I70.0). Electronically Signed   By: Lowella Grip III M.D.   On: 07/17/2018 16:46   Ct Angio Chest/abd/pel For Dissection W And/or Wo Contrast  Result Date: 07/17/2018 CLINICAL DATA:  Shortness of breath and chest pain radiates to the back. EXAM: CT ANGIOGRAPHY CHEST, ABDOMEN AND PELVIS TECHNIQUE:  Multidetector CT imaging through the chest, abdomen and pelvis was performed using the standard protocol during bolus administration of intravenous contrast. Multiplanar reconstructed images and MIPs were obtained and reviewed to evaluate the vascular anatomy. CONTRAST:  118mL OMNIPAQUE IOHEXOL 350 MG/ML SOLN COMPARISON:  CT chest 07/12/2018.  Abdomen and pelvis CT 04/23/2018. FINDINGS: CTA CHEST FINDINGS Cardiovascular: The heart size is normal. No substantial pericardial effusion. Coronary artery calcification is evident. Atherosclerotic calcification is noted in the wall of the thoracic aorta. Precontrast imaging shows no hyperdense crescent in the wall of the thoracic aorta is suggest acute intramural hematoma. Postcontrast arterial phase imaging shows no dissection flap in the thoracic aorta. There is no thoracic aortic aneurysm. Aortic arch vessel anatomy opacifies normally. No large central pulmonary embolus. Mediastinum/Nodes: No mediastinal lymphadenopathy. There is no hilar lymphadenopathy. The esophagus has normal imaging features. There is no axillary lymphadenopathy. Lungs/Pleura: The central tracheobronchial airways are patent. No suspicious nodule or mass. No focal airspace consolidation. Dependent atelectasis bilaterally. No pleural effusion. Musculoskeletal: No worrisome lytic or sclerotic osseous abnormality. Review of the MIP images confirms the above findings. CTA ABDOMEN AND PELVIS FINDINGS VASCULAR Aorta: No abdominal aortic aneurysm. No dissection flap in the abdominal aorta. Atherosclerotic calcification noted. Celiac: Widely patent. SMA: Widely patent. 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 Renals: Single left renal artery appears widely patent. Main and accessory right renal arteries appear diminutive but opacify normally. IMA: Patent. Inflow: Patent. Veins: Unopacified. Review of the MIP images confirms the above findings. NON-VASCULAR Hepatobiliary: No suspicious  focal abnormality within the liver parenchyma. There is no evidence for gallstones, gallbladder wall thickening, or pericholecystic fluid. No intrahepatic or extrahepatic biliary dilation. Pancreas: No focal mass lesion. No dilatation of the main duct. No intraparenchymal cyst. No peripancreatic edema. Spleen: No splenomegaly. No focal mass lesion. Adrenals/Urinary Tract: Similar appearance of small bilateral adrenal nodules, measuring up to 17 mm. These are previously characterized as adenomas. Both kidneys are atrophic. No evidence of hydroureter. Bladder is nondistended. Stomach/Bowel: Proximal stomach is nondistended with somewhat prominent distention of the distal stomach. Duodenum is normally positioned as is the ligament of Treitz. Duodenal diverticulum evident. No small bowel wall thickening.  No small bowel dilatation. No gross colonic mass. No colonic wall thickening. Lymphatic: There is no gastrohepatic or hepatoduodenal ligament lymphadenopathy. No intraperitoneal or retroperitoneal lymphadenopathy. No pelvic sidewall lymphadenopathy. Reproductive: Prostate gland incompletely visualized. Other: No intraperitoneal free fluid. Musculoskeletal: Status post lumbosacral fusion. No worrisome lytic or sclerotic osseous abnormality. Similar appearance of degenerative endplate changes around L3-4. Review of the MIP images confirms the above findings. IMPRESSION: 1. No evidence for thoracoabdominal aortic aneurysm or dissection. No large central pulmonary embolus. 2. No findings to explain the patient's history of chest pain and shortness of breath. 3. Bilateral renal atrophy. 4.  Aortic Atherosclerois (ICD10-170.0) Electronically Signed   By: Misty [place] M.D.   On: 07/17/2018 18:22    Cardiac Studies   Transthoracic Echocardiography  Patient:    Rik, Wadel MR #:       [id number] Study Date: 02/23/2018 Gender:     M Age:        12 Height:     180.3 cm Weight:     71.9 kg BSA:        1.9  m^2 Pt. Status: Room:       [place], MD  ATTENDING    Fredia Sorrow [id number]  [place], [place], Inpatient  ORDERING     Cristal Deer  Scherrie November  SONOGRAPHER  Haroldine Laws  cc:  ------------------------------------------------------------------- LV EF: 60% -   65%  ------------------------------------------------------------------- Indications:      CHF - 428.0.  ------------------------------------------------------------------- History:   PMH:  End stage renal disease.  Coronary artery disease.  Risk factors:  Hypertension.  ------------------------------------------------------------------- Study Conclusions  - Left ventricle: The cavity size was normal. There was severe   concentric hypertrophy. Systolic function was normal. The   estimated ejection fraction was in the range of 60% to 65%. Wall   motion was normal; there were no regional wall motion   abnormalities. There was an increased relative contribution of   atrial contraction to ventricular filling. Doppler parameters are   consistent with abnormal left ventricular relaxation (grade 1   diastolic dysfunction). Doppler parameters are consistent with   high ventricular filling pressure. - Aortic valve: Trileaflet; mildly thickened, mildly calcified   leaflets. Valve area (VTI): 1.99 cm^2. Valve area (Vmax): 2.12   cm^2. Valve area (Vmean): 2.02 cm^2. - Mitral valve: Severely calcified annulus. Moderate diffuse   thickening and calcification of the anterior leaflet and   posterior leaflet. Mobility of the posterior leaflet was   restricted to the point of immobility. The findings are   consistent with moderate stenosis. There was mild regurgitation.   Mean gradient (D): 7 mm Hg. Valve area by continuity equation   (using LVOT flow): 1.11 cm^2. - Left atrium: The atrium was mildly dilated. - Pulmonary arteries: Systolic  pressure could not be accurately   estimated as IVC is not visualized.  Impressions:  - The right ventricular systolic pressure was increased consistent   with mild pulmonary hypertension.  Impression   1. Principal Problem: 2.   Chest pain, atypical 3. Active Problems: 4.   DM (diabetes mellitus) ([employer]) 5.   Hyperlipidemia LDL goal <70 6.   Hypertension 7.   Peripheral arterial disease ([place]) 8.   PVD (peripheral vascular disease) ([place]) 9.   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/[facility] 10.   ESRD (end stage renal disease) on dialysis ([place]) 11.  Recommendation   1. This is a 68 year old male with atypical chest pain.  Troponins have been flat mildly elevated more consistent with medical renal disease.  This is similar to findings in the past.  EKG shows no ischemic changes.  His pain is more mid epigastric, sharp and atypical for cardiac pain.  He had LIMA to LAD without any other critical disease in 2018 and a repeat cath that showed a patent LIMA without progression of disease in January 2019.  No further cardiac work-up is recommended.  He may need more aggressive volume removal with dialysis as pulmonary venous congestion may be causing his chest discomfort (last dialyzed Monday) - given questionable compliance with dialysis and elevated BUN/creatinine.  Additionally, the symptoms may be GI.  The distal stomach was noted to be somewhat prominently distended the CT scan and he is complaining of significant constipation.  Consider GI evaluation. No further ischemic work-up recommended.  Thanks for the consultation.  [facility] will sign off.   Medication Recommendations:  No changes Other recommendations (labs, testing, etc):  none Follow up as an outpatient:  Dr. Gwenlyn Found  Time Spent Directly with Patient:  I have spent a total of 45 minutes with the patient reviewing hospital notes, telemetry, EKGs, labs and examining the patient as well as establishing an assessment and plan that was  discussed personally with the patient.  > 50% of time was spent in direct patient care.  Length of Stay:  LOS: 0 days   Pixie Casino, MD, [facility], [place] Director of the Advanced Lipid Disorders &  Cardiovascular Risk Reduction Clinic Diplomate of the American Board of Clinical Lipidology Attending Cardiologist  Direct Dial: [phone]   Fax: [phone]  Website:  [email or web address] Westra 07/18/2018, 8:49 AM

## 2018-07-19 DIAGNOSIS — R0789 Other chest pain: Secondary | ICD-10-CM | POA: Diagnosis not present

## 2018-07-19 LAB — GLUCOSE, CAPILLARY
Glucose-Capillary: 72 mg/dL (ref 70–99)
Glucose-Capillary: 75 mg/dL (ref 70–99)
Glucose-Capillary: 70 mg/dL (ref 70–99)

## 2018-07-19 LAB — HEPATITIS B SURFACE ANTIGEN: Hepatitis B Surface Ag: NEGATIVE

## 2018-07-19 MED ORDER — ONDANSETRON HCL 4 MG/2ML IJ SOLN
INTRAMUSCULAR | Status: AC
  Administered 2018-07-19: 4 mg via INTRAVENOUS
  Filled 2018-07-19: qty 2

## 2018-07-19 MED ORDER — AMLODIPINE BESYLATE 5 MG PO TABS: 5.0000 mg | ORAL_TABLET | Freq: Every day | ORAL | 0 refills | Status: DC

## 2018-07-19 NOTE — Progress Notes (Signed)
[facility] Progress Note   Subjective: No C/O chest pain at present. Going to HD shortly. Discussed need to lower volume.   Objective Vitals:   07/18/18 1718 07/18/18 1951 07/19/18 0552 07/19/18 0820  BP: 129/79 (!) 144/74 (!) 151/85 (!) 144/81  Pulse:  63 69 80  Resp:      Temp:  97.9 F (36.6 C) 98 F (36.7 C)   TempSrc:  Oral Oral   SpO2:  100% 98% 96%  Weight:   71.1 kg   Height:       Physical Exam General: Pleasant NAD Heart: S1,S2 RRR SR on monitor.  Lungs: CTAB A/P Abdomen: S, NT Extremities: Cast LFA (Fx as OP) No LE edema Dialysis Access: L AVF aneurysmal + bruit.    Additional Objective Labs: Basic Metabolic Panel: Recent Labs  Lab 07/17/18 1621  NA 138  K 4.0  CL 94*  CO2 26  GLUCOSE 106*  BUN 30*  CREATININE 9.70*  CALCIUM 9.6   Liver Function Tests: Recent Labs  Lab 07/17/18 1913  AST 14*  ALT 14  ALKPHOS 72  BILITOT 0.3  PROT 7.2  ALBUMIN 3.5   Recent Labs  Lab 07/17/18 1913  LIPASE 35   CBC: Recent Labs  Lab 07/17/18 1621  WBC 7.3  HGB 11.6*  HCT 37.8*  MCV 89.4  PLT 216   Blood Culture    Component Value Date/Time   SDES BLOOD RIGHT ARM 04/23/2018 0222   SPECREQUEST  04/23/2018 0222    BOTTLES DRAWN AEROBIC AND ANAEROBIC Blood Culture adequate volume   CULT  04/23/2018 0222    NO GROWTH 5 DAYS Performed at [facility], [place] [address], [place], [place] [address]    REPTSTATUS 04/28/2018 FINAL 04/23/2018 0222    Cardiac Enzymes: Recent Labs  Lab 07/12/18 1153 07/17/18 1621 07/17/18 2056 07/18/18 0218  TROPONINI <0.03 0.04* 0.03* 0.03*   CBG: Recent Labs  Lab 07/18/18 0843 07/18/18 1137 07/18/18 1625 07/18/18 2125 07/19/18 0712  GLUCAP 78 72 103* 100* 72   Iron Studies: No results for input(s): IRON, TIBC, TRANSFERRIN, FERRITIN in the last 72 hours. @lablastinr3 @ Studies/Results: Dg Chest 2 View  Result Date: 07/17/2018 CLINICAL DATA:  Chest pain and shortness of breath.  EXAM: CHEST - 2 VIEW COMPARISON:  Chest radiograph and chest CT July 12, 2018 FINDINGS: There is no appreciable edema or consolidation. Heart is upper normal in size with pulmonary vascularity normal. No adenopathy. There is aortic atherosclerosis. No bone lesions. IMPRESSION: No edema or consolidation. Heart upper normal in size. Aortic Atherosclerosis (ICD10-I70.0). Electronically Signed   By: Lowella Grip III M.D.   On: 07/17/2018 16:46   Ct Angio Chest/abd/pel For Dissection W And/or Wo Contrast  Result Date: 07/17/2018 CLINICAL DATA:  Shortness of breath and chest pain radiates to the back. EXAM: CT ANGIOGRAPHY CHEST, ABDOMEN AND PELVIS TECHNIQUE: Multidetector CT imaging through the chest, abdomen and pelvis was performed using the standard protocol during bolus administration of intravenous contrast. Multiplanar reconstructed images and MIPs were obtained and reviewed to evaluate the vascular anatomy. CONTRAST:  150mL OMNIPAQUE IOHEXOL 350 MG/ML SOLN COMPARISON:  CT chest 07/12/2018.  Abdomen and pelvis CT 04/23/2018. FINDINGS: CTA CHEST FINDINGS Cardiovascular: The heart size is normal. No substantial pericardial effusion. Coronary artery calcification is evident. Atherosclerotic calcification is noted in the wall of the thoracic aorta. Precontrast imaging shows no hyperdense crescent in the wall of the thoracic aorta is suggest acute intramural hematoma. Postcontrast arterial phase imaging  shows no dissection flap in the thoracic aorta. There is no thoracic aortic aneurysm. Aortic arch vessel anatomy opacifies normally. No large central pulmonary embolus. Mediastinum/Nodes: No mediastinal lymphadenopathy. There is no hilar lymphadenopathy. The esophagus has normal imaging features. There is no axillary lymphadenopathy. Lungs/Pleura: The central tracheobronchial airways are patent. No suspicious nodule or mass. No focal airspace consolidation. Dependent atelectasis bilaterally. No pleural effusion.  Musculoskeletal: No worrisome lytic or sclerotic osseous abnormality. Review of the MIP images confirms the above findings. CTA ABDOMEN AND PELVIS FINDINGS VASCULAR Aorta: No abdominal aortic aneurysm. No dissection flap in the abdominal aorta. Atherosclerotic calcification noted. Celiac: Widely patent. SMA: Widely patent. 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 Renals: Single left renal artery appears widely patent. Main and accessory right renal arteries appear diminutive but opacify normally. IMA: Patent. Inflow: Patent. Veins: Unopacified. Review of the MIP images confirms the above findings. NON-VASCULAR Hepatobiliary: No suspicious focal abnormality within the liver parenchyma. There is no evidence for gallstones, gallbladder wall thickening, or pericholecystic fluid. No intrahepatic or extrahepatic biliary dilation. Pancreas: No focal mass lesion. No dilatation of the main duct. No intraparenchymal cyst. No peripancreatic edema. Spleen: No splenomegaly. No focal mass lesion. Adrenals/Urinary Tract: Similar appearance of small bilateral adrenal nodules, measuring up to 17 mm. These are previously characterized as adenomas. Both kidneys are atrophic. No evidence of hydroureter. Bladder is nondistended. Stomach/Bowel: Proximal stomach is nondistended with somewhat prominent distention of the distal stomach. Duodenum is normally positioned as is the ligament of Treitz. Duodenal diverticulum evident. No small bowel wall thickening. No small bowel dilatation. No gross colonic mass. No colonic wall thickening. Lymphatic: There is no gastrohepatic or hepatoduodenal ligament lymphadenopathy. No intraperitoneal or retroperitoneal lymphadenopathy. No pelvic sidewall lymphadenopathy. Reproductive: Prostate gland incompletely visualized. Other: No intraperitoneal free fluid. Musculoskeletal: Status post lumbosacral fusion. No worrisome lytic or sclerotic osseous abnormality. Similar appearance of  degenerative endplate changes around L3-4. Review of the MIP images confirms the above findings. IMPRESSION: 1. No evidence for thoracoabdominal aortic aneurysm or dissection. No large central pulmonary embolus. 2. No findings to explain the patient's history of chest pain and shortness of breath. 3. Bilateral renal atrophy. 4.  Aortic Atherosclerois (ICD10-170.0) Electronically Signed   By: Misty Stanley M.D.   On: 07/17/2018 18:22   Medications:  . amLODipine  5 mg Oral Daily  . aspirin EC  81 mg Oral Daily  . atorvastatin  40 mg Oral q1800  . calcium acetate  2,001 mg Oral TID WC  . Chlorhexidine Gluconate Cloth  6 each Topical Q0600  . gabapentin  100 mg Oral BID  . insulin aspart  0-5 Units Subcutaneous QHS  . insulin aspart  0-9 Units Subcutaneous TID WC  . multivitamin  1 tablet Oral QHS  . pantoprazole  40 mg Oral Daily  . tamsulosin  0.4 mg Oral Daily   HD orders: [facility] MWF 4 hr 180NRe 450/800 69.5 kg 2.0 K/ 2.0 Ca UFP 2 -Heparin 5000 units IV TIW -Parsabiv 2.5 mg IV TIW -Calcitriol 1.25 mcg PO TIW   Assessment/Plan: 68 year old BM with ESRD, DM, HTN and vascular disease.  He presents with CP which is felt to be atypical   1 CP-  troponins OK- cards eval , no further work up recommended.  Gently suggesting that volume might be responsible for CP 2 ESRD: normally MWF via AVF at [facility]. Missed HD 07/17/18 HD today on schedule.  3 Hypertension: BP fine on norvasc 5 and flomax,  will attempt more aggressive UF today- needs lower EDW at discharge   4. Anemia of ESRD: HGB 11.6 No OP ESA. Follow HGB 5. Metabolic Bone Disease: will continue OP calcitriol, phoslo - cannot give parsabiv 6.DM- per primary   Rita H. Brown NP-C 07/19/2018, 8:28 AM  [employer] [phone]

## 2018-07-19 NOTE — Plan of Care (Signed)
  Problem: Clinical Measurements: Goal: Will remain free from infection Outcome: Progressing Note:  No s/s of infection noted.   

## 2018-07-19 NOTE — Discharge Summary (Signed)
Physician Discharge Summary  Jon Anderson NLZ:[id number] DOB: 1950/05/08 DOA: 07/17/2018  PCP: [name], MD  Admit date: 07/17/2018 Discharge date: 07/19/2018  Time spent: 40 minutes  Recommendations for Outpatient Follow-up:  1. Follow up outpatient CBC/CMP  2. Consider GI follow up outpatient  3. Follow up with PCP 4. Resume dialysis schedule  5. 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   Patient presents with what appears to be an acute on chronic episode of chest pain. Recently evaluated earlier thisweek for similar episode; he comes in again today complainingof epigastric/mid chest painradiatingleft laterally withsharp pain without notable exacerbating or alleviating factors. He does note associated nausea without vomiting and questionable diaphoresis but has not measured for fever.This is not exacerbated byactivity, position, dietary intake,nor is this alleviated byrest.Patient is a moderately poor historian -unable to further classify his pain other than sharp. Indicates his pain does radiate midsternally/epigastrically to the left lateral chest wall-butdoes not include or radiate towards the neck or Anderson the left  arm.  *Cardiac catheterization from 04/2017 reviewed. Proximal LAD lesion 75%. LIMA-LAD widely patent. LVEDP 0-3 mmHg. Non-obstructive disease in other coronary arteries.  *TTE from 03/2017 reviewed. LVEF=55%. Moderate LVH. Severe mitral annular calcification with mild mitral stenosis. Severe LA dilatation. Mild TR.  He was admitted for chest pain rule out.  He was seen by cardiology who felt possibly GI vs volume overload.  He had dialysis on day of discharge.  Discharged with recommendations to follow up with PCP, consider GI follow up outpatient.   Hospital Course:  Atypical chest pain ACS vs GI etiology -HEART score= 6 given risk factors, age and history -Patient with extensive Hx CAD/CABG/PVD/CAD -Cardiology consulted cardiology does not believe a cardiology problem.  Cardiology thought atypical for cardiac pain.  Did not recommend further cardiac work up.  Possibly fluid overload which needs to be removed via HD (i.e. pulmonary venous congestion).  He's now s/p dialysis today. -No further cardiology work-up required. - Consider GI follow up outpatient  -Drug panel pending  HTN - Poorly controlled upon admission SBP 180s - Continue amlodipine, started in house - improved BP at discharge -Hydralazine PRN  Diabetes type 2 uncontrolled with complication - Hemoglobin A1c 6.5 - LDL 68 - Diet controlled  HLD - Lipitor 40 mg daily - LDL 68  Anemia of chronic disease (hemoglobin baseline11.7-12.7) -No overt signs of bleeding LastLabs      Recent Labs  Lab 07/12/18 0756 07/17/18 1621  HGB 12.6* 11.6*    -At baseline  ESRD M/W/F via LEFT upper extremity fistula - S/p dialysis 4/10 - seen by renal here - resume dialysis schedule at discharge MWF   Procedures:  none  Consultations:  Renal  Cardiology  Discharge Exam:  Vitals:   07/19/18 1500 07/19/18 1700  BP: (!) 173/94 (!) 143/76  Pulse: 80   Resp:    Temp:  97.6 F (36.4 C)  SpO2:  96%    Denies complaints.  General: No acute distress. Cardiovascular: Heart sounds show a regular rate, and rhythm.  Lungs: Clear to auscultation bilaterally Abdomen: Soft, nontender, nondistended  Neurological: Alert and oriented 3. Moves all extremities 4. Cranial nerves II through XII grossly intact. Skin: Warm and dry. No rashes or lesions. Extremities: No clubbing or cyanosis. No edema.   Discharge Instructions   Discharge Instructions    Call MD for:  difficulty breathing, headache or visual disturbances   Complete by:  As directed    Call MD for:  hives   Complete by:  As directed    Call MD for:  persistant dizziness or light-headedness   Complete by:  As directed    Call MD for:  persistant nausea and vomiting   Complete by:  As directed    Call MD for:  redness, tenderness, or signs of infection (pain, swelling, redness, odor or green/yellow discharge around incision site)   Complete by:  As directed    Call MD for:  severe uncontrolled pain   Complete by:  As directed    Call MD for:  temperature >100.4   Complete by:  As directed    Diet - low sodium heart healthy   Complete by:  As directed    Discharge instructions   Complete by:  As directed    You were seen for chest discomfort.  You were seen by cardiology who did not recommend any additional cardiac work up at this time.  They thought your symptoms may be GI (stomach).  Please follow up with your PCP as an outpatient and consider following up with a gastroenterologist as an outpatient.  Your CT scan did not show any revealing findings.  You had dialysis here with renal.  Please resume your normal dialysis schedule.  Return for new, recurrent, or worsening symptoms.  Please ask your PCP to request records from this hospitalization so they know what was done and what the next steps will be.   Increase activity slowly   Complete by:  As directed      Allergies as of 07/19/2018      Reactions   Kiwi  Extract Itching, Swelling, Other (See Comments)   Lips and face swell- breathing not affected   Tape Other (See Comments)   "Plastic" tape causes blisters!!      Medication List    TAKE these medications   acetaminophen 500 MG tablet Commonly known as:  TYLENOL Take 1,000 mg by mouth 2 (two) times daily as needed for moderate pain or headache.   amLODipine 5 MG tablet Commonly known as:  NORVASC Take 1 tablet (5 mg total) by mouth daily for 30 days. Start taking on:  July 20, 2018   aspirin EC 81 MG tablet Take 1 tablet (81 mg total) by mouth daily.   atorvastatin 40 MG tablet Commonly known as:  LIPITOR Take 1 tablet (40 mg total) by mouth daily at 6 PM.   calcium acetate 667 MG capsule Commonly known as:  PHOSLO Take 3 capsules (2,001 mg total) by mouth 3 (three) times daily with meals. What changed:    how much to take  when to take this   cyclobenzaprine 5 MG tablet Commonly known as:  FLEXERIL Take 1 tablet (5 mg total) by  mouth 3 (three) times daily as needed for muscle spasms.   gabapentin 100 MG capsule Commonly known as:  NEURONTIN Take 100 mg by mouth 2 (two) times daily.   HYDROcodone-acetaminophen 5-325 MG tablet Commonly known as:  NORCO/VICODIN Take 1 tablet by mouth every 4 (four) hours as needed.   multivitamin Tabs tablet Take 1 tablet by mouth at bedtime.   nitroGLYCERIN 0.4 MG SL tablet Commonly known as:  Nitrostat Place 1 tablet (0.4 mg total) under the tongue every 5 (five) minutes as needed for chest pain.   pantoprazole 40 MG tablet Commonly known as:  PROTONIX Take 40 mg by mouth daily.   polyethylene glycol 17 g packet Commonly known as:  MIRALAX / GLYCOLAX Take 17 g by mouth daily as needed for moderate constipation.   tamsulosin 0.4 MG Caps capsule Commonly known as:  FLOMAX Take 0.4 mg by mouth daily.      Allergies  Allergen Reactions  . Kiwi Extract Itching, Swelling and Other (See Comments)    Lips and face swell-  breathing not affected  . Tape Other (See Comments)    "Plastic" tape causes blisters!!   Follow-up Information    [facility] Follow up.   Specialty:  Home Health Services Contact information: [place] [address] [phone]        [name], MD Follow up.   Specialty:  Internal Medicine Why:  Call for follow up.  Ask about follow up with a gastroenterologist. Contact information: [address] [place] [address] [phone]        Lorretta Harp, MD .   Specialties:  Cardiology, Radiology Contact information: [address] [place] [address] [phone]            The results of significant diagnostics from this hospitalization (including imaging, microbiology, ancillary and laboratory) are listed below for reference.    Significant Diagnostic Studies: Dg Chest 2 View  Result Date: 07/17/2018 CLINICAL DATA:  Chest pain and shortness of breath. EXAM: CHEST - 2 VIEW COMPARISON:  Chest radiograph and chest CT July 12, 2018 FINDINGS: There is no appreciable edema or consolidation. Heart is upper normal in size with pulmonary vascularity normal. No adenopathy. There is aortic atherosclerosis. No bone lesions. IMPRESSION: No edema or consolidation. Heart upper normal in size. Aortic Atherosclerosis (ICD10-I70.0). Electronically Signed   By: Lowella Grip III M.D.   On: 07/17/2018 16:46   Dg Chest 2 View  Result Date: 07/12/2018 CLINICAL DATA:  Chest pain shortness of breath. EXAM: CHEST - 2 VIEW COMPARISON:  Chest x-ray dated May 31, 2018. FINDINGS: The heart size and mediastinal contours are within normal limits. Atherosclerotic calcification of the aortic arch. Normal pulmonary vascularity. No focal consolidation, pleural effusion, or pneumothorax. No acute osseous abnormality. IMPRESSION: No active cardiopulmonary disease. Electronically Signed   By: Titus Dubin M.D.   On: 07/12/2018 08:22    Ct Angio Chest Pe W And/or Wo Contrast  Result Date: 07/12/2018 CLINICAL DATA:  Chest pain radiating to the back for the past 3 days. EXAM: CT ANGIOGRAPHY CHEST WITH CONTRAST TECHNIQUE: Multidetector CT imaging of the chest was performed using the standard protocol during bolus administration of intravenous contrast. Multiplanar CT image reconstructions and MIPs were obtained to evaluate the vascular anatomy. CONTRAST:  68mL OMNIPAQUE IOHEXOL 350 MG/ML SOLN COMPARISON:  CTA chest dated February 22, 2018. FINDINGS: Cardiovascular: Satisfactory opacification of the pulmonary arteries to the segmental level. No evidence of  pulmonary embolism. Normal heart size. No pericardial effusion. No thoracic aortic aneurysm. Coronary, aortic arch, and branch vessel atherosclerotic vascular disease. Mediastinum/Nodes: No enlarged mediastinal, hilar, or axillary lymph nodes. Thyroid gland, trachea, and esophagus demonstrate no significant findings. Lungs/Pleura: Minimal dependent subsegmental atelectasis in both lower lobes. No focal consolidation, pleural effusion, or pneumothorax. No suspicious pulmonary nodule. Upper Abdomen: No acute abnormality. Unchanged bilateral adrenal adenomas and bilateral renal atrophy. Musculoskeletal: Bilateral gynecomastia. No acute or significant osseous findings. Review of the MIP images confirms the above findings. IMPRESSION: 1. No evidence of pulmonary embolism. No acute intrathoracic process. 2.  Aortic atherosclerosis (ICD10-I70.0). Electronically Signed   By: Titus Dubin M.D.   On: 07/12/2018 11:41   Ct Angio Chest/abd/pel For Dissection W And/or Wo Contrast  Result Date: 07/17/2018 CLINICAL DATA:  Shortness of breath and chest pain radiates to the back. EXAM: CT ANGIOGRAPHY CHEST, ABDOMEN AND PELVIS TECHNIQUE: Multidetector CT imaging through the chest, abdomen and pelvis was performed using the standard protocol during bolus administration of intravenous contrast. Multiplanar  reconstructed images and MIPs were obtained and reviewed to evaluate the vascular anatomy. CONTRAST:  144mL OMNIPAQUE IOHEXOL 350 MG/ML SOLN COMPARISON:  CT chest 07/12/2018.  Abdomen and pelvis CT 04/23/2018. FINDINGS: CTA CHEST FINDINGS Cardiovascular: The heart size is normal. No substantial pericardial effusion. Coronary artery calcification is evident. Atherosclerotic calcification is noted in the wall of the thoracic aorta. Precontrast imaging shows no hyperdense crescent in the wall of the thoracic aorta is suggest acute intramural hematoma. Postcontrast arterial phase imaging shows no dissection flap in the thoracic aorta. There is no thoracic aortic aneurysm. Aortic arch vessel anatomy opacifies normally. No large central pulmonary embolus. Mediastinum/Nodes: No mediastinal lymphadenopathy. There is no hilar lymphadenopathy. The esophagus has normal imaging features. There is no axillary lymphadenopathy. Lungs/Pleura: The central tracheobronchial airways are patent. No suspicious nodule or mass. No focal airspace consolidation. Dependent atelectasis bilaterally. No pleural effusion. Musculoskeletal: No worrisome lytic or sclerotic osseous abnormality. Review of the MIP images confirms the above findings. CTA ABDOMEN AND PELVIS FINDINGS VASCULAR Aorta: No abdominal aortic aneurysm. No dissection flap in the abdominal aorta. Atherosclerotic calcification noted. Celiac: Widely patent. SMA: Widely patent. 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 Renals: Single left renal artery appears widely patent. Main and accessory right renal arteries appear diminutive but opacify normally. IMA: Patent. Inflow: Patent. Veins: Unopacified. Review of the MIP images confirms the above findings. NON-VASCULAR Hepatobiliary: No suspicious focal abnormality within the liver parenchyma. There is no evidence for gallstones, gallbladder wall thickening, or pericholecystic fluid. No intrahepatic or extrahepatic  biliary dilation. Pancreas: No focal mass lesion. No dilatation of the main duct. No intraparenchymal cyst. No peripancreatic edema. Spleen: No splenomegaly. No focal mass lesion. Adrenals/Urinary Tract: Similar appearance of small bilateral adrenal nodules, measuring up to 17 mm. These are previously characterized as adenomas. Both kidneys are atrophic. No evidence of hydroureter. Bladder is nondistended. Stomach/Bowel: Proximal stomach is nondistended with somewhat prominent distention of the distal stomach. Duodenum is normally positioned as is the ligament of Treitz. Duodenal diverticulum evident. No small bowel wall thickening. No small bowel dilatation. No gross colonic mass. No colonic wall thickening. Lymphatic: There is no gastrohepatic or hepatoduodenal ligament lymphadenopathy. No intraperitoneal or retroperitoneal lymphadenopathy. No pelvic sidewall lymphadenopathy. Reproductive: Prostate gland incompletely visualized. Other: No intraperitoneal free fluid. Musculoskeletal: Status post lumbosacral fusion. No worrisome lytic or sclerotic osseous abnormality. Similar appearance of degenerative endplate changes around L3-4. Review of the MIP images confirms the  above findings. IMPRESSION: 1. No evidence for thoracoabdominal aortic aneurysm or dissection. No large central pulmonary embolus. 2. No findings to explain the patient's history of chest pain and shortness of breath. 3. Bilateral renal atrophy. 4.  Aortic Atherosclerois (ICD10-170.0) Electronically Signed   By: Misty Stanley M.D.   On: 07/17/2018 18:22    Microbiology: No results found for this or any previous visit (from the past 240 hour(s)).   Labs: Basic Metabolic Panel: Recent Labs  Lab 07/17/18 1621  NA 138  K 4.0  CL 94*  CO2 26  GLUCOSE 106*  BUN 30*  CREATININE 9.70*  CALCIUM 9.6   Liver Function Tests: Recent Labs  Lab 07/17/18 1913  AST 14*  ALT 14  ALKPHOS 72  BILITOT 0.3  PROT 7.2  ALBUMIN 3.5   Recent Labs   Lab 07/17/18 1913  LIPASE 35   No results for input(s): AMMONIA in the last 168 hours. CBC: Recent Labs  Lab 07/17/18 1621  WBC 7.3  HGB 11.6*  HCT 37.8*  MCV 89.4  PLT 216   Cardiac Enzymes: Recent Labs  Lab 07/17/18 1621 07/17/18 2056 07/18/18 0218  TROPONINI 0.04* 0.03* 0.03*   BNP: BNP (last 3 results) Recent Labs    02/22/18 1945 07/12/18 0756  BNP 1,612.8* 201.8*    ProBNP (last 3 results) No results for input(s): PROBNP in the last 8760 hours.  CBG: Recent Labs  Lab 07/18/18 1625 07/18/18 2125 07/19/18 0712 07/19/18 1115 07/19/18 1708  GLUCAP 103* 100* 72 75 70       Signed:  Fayrene Helper MD.  [facility] 07/19/2018, 5:30 PM

## 2018-07-19 NOTE — Progress Notes (Signed)
Pt returned from HD. BP 143/76, HR 80. Pain 6/10 due to leg cramps.  Florene Glen, MD notified.

## 2018-07-23 LAB — DRUG SCREEN 10 W/CONF, SERUM
Amphetamines, IA: NEGATIVE ng/mL
Barbiturates, IA: NEGATIVE ug/mL
Benzodiazepines, IA: NEGATIVE ng/mL
Cocaine & Metabolite, IA: NEGATIVE ng/mL
Methadone, IA: NEGATIVE ng/mL
Opiates, IA: POSITIVE ng/mL
Oxycodones, IA: NEGATIVE ng/mL
Phencyclidine, IA: NEGATIVE ng/mL
Propoxyphene, IA: NEGATIVE ng/mL
THC(Marijuana) Metabolite, IA: NEGATIVE ng/mL

## 2018-07-23 LAB — OPIATES,MS,WB/SP RFX
6-Acetylmorphine: NEGATIVE
Codeine: NEGATIVE ng/mL
Dihydrocodeine: NEGATIVE ng/mL
Hydrocodone: 2.7 ng/mL
Hydromorphone: NEGATIVE ng/mL
Morphine: NEGATIVE ng/mL
Opiate Confirmation: POSITIVE

## 2018-07-23 LAB — OXYCODONES,MS,WB/SP RFX
Oxycocone: NEGATIVE ng/mL
Oxycodones Confirmation: NEGATIVE
Oxymorphone: NEGATIVE ng/mL

## 2018-08-07 ENCOUNTER — Telehealth: Payer: Self-pay | Admitting: Cardiology

## 2018-08-07 NOTE — Telephone Encounter (Signed)
Smartphone/ my chart/ consent/ pre reg completed °

## 2018-08-08 ENCOUNTER — Telehealth: Payer: Self-pay

## 2018-08-08 ENCOUNTER — Encounter: Payer: Self-pay | Admitting: Cardiology

## 2018-08-08 ENCOUNTER — Telehealth (INDEPENDENT_AMBULATORY_CARE_PROVIDER_SITE_OTHER): Payer: Medicare Other | Admitting: Cardiology

## 2018-08-08 VITALS — BP 170/76 | HR 83 | Ht 71.0 in | Wt 157.0 lb

## 2018-08-08 DIAGNOSIS — N186 End stage renal disease: Secondary | ICD-10-CM

## 2018-08-08 DIAGNOSIS — I10 Essential (primary) hypertension: Secondary | ICD-10-CM

## 2018-08-08 DIAGNOSIS — K219 Gastro-esophageal reflux disease without esophagitis: Secondary | ICD-10-CM

## 2018-08-08 DIAGNOSIS — Z951 Presence of aortocoronary bypass graft: Secondary | ICD-10-CM

## 2018-08-08 DIAGNOSIS — I739 Peripheral vascular disease, unspecified: Secondary | ICD-10-CM

## 2018-08-08 DIAGNOSIS — R0789 Other chest pain: Secondary | ICD-10-CM | POA: Diagnosis not present

## 2018-08-08 DIAGNOSIS — Z992 Dependence on renal dialysis: Secondary | ICD-10-CM

## 2018-08-08 DIAGNOSIS — E785 Hyperlipidemia, unspecified: Secondary | ICD-10-CM

## 2018-08-08 NOTE — Patient Instructions (Signed)
Medication Instructions:  Your physician recommends that you continue on your current medications as directed. Please refer to the Current Medication list given to you today. If you need a refill on your cardiac medications before your next appointment, please call your pharmacy.   Lab work: None  If you have labs (blood work) drawn today and your tests are completely normal, you will receive your results only by: Marland Kitchen MyChart Message (if you have MyChart) OR . A paper copy in the mail If you have any lab test that is abnormal or we need to change your treatment, we will call you to review the results.  Testing/Procedures: None   Follow-Up: At [facility], you and your health needs are our priority.  As part of our continuing mission to provide you with exceptional heart care, we have created designated Provider Care Teams.  These Care Teams include your primary Cardiologist (physician) and Advanced Practice Providers (APPs -  Physician Assistants and Nurse Practitioners) who all work together to provide you with the care you need, when you need it. You will need a follow up appointment in 6 months.  Please call our office 2 months (August) in advance to schedule this appointment.  You may see [name], MD or one of the following Advanced Practice Providers on your designated Care Team:   Kerin Ransom, PA-C Roby Lofts, [place] . Sande Rives, PA-C  Any Other Special Instructions Will Be Listed Below (If Applicable).

## 2018-08-08 NOTE — Telephone Encounter (Signed)
Contacted patient and reviewed AVS instructions. Patient requested refill on Hydrocodone or Tramadol and per [facility] patient will need to get refill from his PCP. Patient voiced understanding.

## 2018-08-08 NOTE — Progress Notes (Signed)
Virtual Visit via Video Note   This visit type was conducted due to national recommendations for restrictions regarding the COVID-19 Pandemic (e.g. social distancing) in an effort to limit this patient's exposure and mitigate transmission in our community.  Due to his co-morbid illnesses, this patient is at least at moderate risk for complications without adequate follow up.  This format is felt to be most appropriate for this patient at this time.  All issues noted in this document were discussed and addressed.  A limited physical exam was performed with this format.  Please refer to the patient's chart for his consent to telehealth for [facility].  Evaluation Performed:  Follow-up visit  This visit type was conducted due to national recommendations for restrictions regarding the COVID-19 Pandemic (e.g. social distancing).  This format is felt to be most appropriate for this patient at this time.  All issues noted in this document were discussed and addressed.  No physical exam was performed (except for noted visual exam findings with Video Visits).  Please refer to the patient's chart (MyChart message for video visits and phone note for telephone visits) for the patient's consent to telehealth for [facility].  Date:  08/08/2018   ID:  Jon Anderson, DOB 01/19/1951, MRN [id number]  Patient Location: Home [address] [place] [address]   Provider location:   [facility] [place]  PCP:  [name], MD  Cardiologist:  [name], MD  Electrophysiologist:  None   Chief Complaint:  Post hospital follow up  History of Present Illness:    Jon Anderson is a 68 y.o. male who presents via audio/video conferencing for a telehealth visit today.  Patient has a history of vascular disease, status post prior left SFA PTA, hypertension that is been difficult to control, and end-stage renal disease.  He was being worked up at [facility] for transplant.  It was discovered he had  significant single-vessel disease and he underwent CABG x1 with an LIMA to LAD in 2018 at [facility].  He underwent diagnostic catheterization in January 2019 by Dr. Gwenlyn Found for complaints of chest pain that revealed a patent graft and no significant residual coronary disease.  The patient presented to the hospital for 07/18/2018 with chest pain.  He has had minimally elevated troponin I as he has had in the past.  He was seen by cardiology in consultation.  On interview it was felt his chest discomfort was noncardiac.  He in fact has some trouble swallowing and we felt his symptoms were primarily GI in origin.  He was discharged without further cardiac work-up.  He was contacted today for post hospital visit.  His main complaint today was numbness and tingling in his left ring and little finger, he is being followed by Dr. Ninfa Linden for what sounds like carpal tunnel syndrome.  He is also arranged to have an endoscopy and esophageal dilatation with Dr. Benson Norway.    The patient does not symptoms concerning for COVID-19 infection (fever, chills, cough, or new SHORTNESS OF BREATH).    Prior CV studies:   The following studies were reviewed today:  Past Medical History:  Diagnosis Date  . Anemia of chronic disease   . CAD (coronary artery disease)    a.  Myoview 4/11: EF 53%, no scar or ischemia   c. MV 2012 Nl perfusion, apical thinning.  No ischemia or scar.  EF 49%, appears greater by visual estimate.;  d.  Dob stress echo 12/13:  Negative Dob  stress echo. There is no evidence of ischemia.  The LVF is normal. b. Normal cors 2016.  . Carotid stenosis    a. <62% RICA, >83% LICA by duplex 04/5174  . Chronic chest pain    occ  . ESRD (end stage renal disease) on dialysis ([place])    M-W-F  . GERD (gastroesophageal reflux disease)   . HNP (herniated nucleus pulposus), lumbar   . HTN (hypertension)    echo 3/10: EF 60%, LAE  . Hyperlipidemia   . Nephrolithiasis    "passed them all"  . Peripheral arterial  disease ([place])    a. s/p PTCA to L SFA.  Marland Kitchen Pneumonia   . Restless legs   . Sleep apnea    no cpap, needs to reschedule appointment to set up aquiring cpap  . Snores    a. presumed OSA, pt has refused sleep eval in past.  . Type II diabetes mellitus ([place])    no longer on medications, checks blood glucose at home   Past Surgical History:  Procedure Laterality Date  . ANGIOPLASTY / STENTING FEMORAL Left 12/11/2013   dr berry  . AV FISTULA PLACEMENT Left 03/19/2014   Procedure: CREATION OF ARTERIOVENOUS (AV) FISTULA  LEFT UPPER ARM;  Surgeon: Mal Misty, MD;  Location: [place];  Service: Vascular;  Laterality: Left;  . CARDIAC CATHETERIZATION  2001 and 2010   . COLONOSCOPY W/ BIOPSIES AND POLYPECTOMY    . COLONOSCOPY WITH PROPOFOL N/A 08/01/2016   Procedure: COLONOSCOPY WITH PROPOFOL;  Surgeon: Carol Ada, MD;  Location: [facility];  Service: Endoscopy;  Laterality: N/A;  . CORONARY ARTERY BYPASS GRAFT  2018  . ESOPHAGOGASTRODUODENOSCOPY (EGD) WITH PROPOFOL N/A 08/01/2016   Procedure: ESOPHAGOGASTRODUODENOSCOPY (EGD) WITH PROPOFOL;  Surgeon: Carol Ada, MD;  Location: [facility];  Service: Endoscopy;  Laterality: N/A;  . FOOT FRACTURE SURGERY Right    ligament repair  . FRACTURE SURGERY    . INGUINAL HERNIA REPAIR Left   . LEFT HEART CATH AND CORS/GRAFTS ANGIOGRAPHY N/A 04/27/2017   Procedure: LEFT HEART CATH AND CORS/GRAFTS ANGIOGRAPHY;  Surgeon: Leonie Man, MD;  Location: [place] CV [facility];  Service: Cardiovascular;  Laterality: N/A;  . LEFT HEART CATHETERIZATION WITH CORONARY ANGIOGRAM N/A 06/22/2014   Procedure: LEFT HEART CATHETERIZATION WITH CORONARY ANGIOGRAM;  Surgeon: Troy Sine, MD;  Location: [facility] CATH LAB;  Service: Cardiovascular;  Laterality: N/A;  . LOWER EXTREMITY ANGIOGRAM Left 12/11/2013   Procedure: LOWER EXTREMITY ANGIOGRAM;  Surgeon: Lorretta Harp, MD;  Location: [facility] CATH LAB;  Service: Cardiovascular;  Laterality: Left;  . LUMBAR  LAMINECTOMY/DECOMPRESSION MICRODISCECTOMY Right 07/03/2017   Procedure: MICRODISCECTOMY LUMBAR FIVE - SACRAL ONE RIGHT;  Surgeon: Consuella Lose, MD;  Location: [place];  Service: Neurosurgery;  Laterality: Right;  . LUMBAR LAMINECTOMY/DECOMPRESSION MICRODISCECTOMY Right 10/19/2017   Procedure: MICRODISCECTOMY LUMBAR FIVE- SACRAL 1 ONE ;  Surgeon: Consuella Lose, MD;  Location: [place];  Service: Neurosurgery;  Laterality: Right;  . TONSILLECTOMY AND ADENOIDECTOMY    . WISDOM TOOTH EXTRACTION       Current Meds  Medication Sig  . acetaminophen (TYLENOL) 500 MG tablet Take 1,000 mg by mouth every 4 (four) hours as needed for moderate pain or headache.   Marland Kitchen amLODipine (NORVASC) 5 MG tablet Take 1 tablet (5 mg total) by mouth daily for 30 days.  Marland Kitchen aspirin EC 81 MG tablet Take 1 tablet (81 mg total) by mouth daily.  Marland Kitchen atorvastatin (LIPITOR) 40 MG tablet Take 1 tablet (40 mg total)  by mouth daily at 6 PM.  . calcium acetate (PHOSLO) 667 MG capsule Take 3 capsules (2,001 mg total) by mouth 3 (three) times daily with meals. (Patient taking differently: Take 1,334 mg by mouth 3 (three) times daily with meals. )  . gabapentin (NEURONTIN) 100 MG capsule Take 100 mg by mouth 2 (two) times daily.   Marland Kitchen HYDROcodone-acetaminophen (NORCO/VICODIN) 5-325 MG tablet Take 1 tablet by mouth every 4 (four) hours as needed.  . multivitamin (RENA-VIT) TABS tablet Take 1 tablet by mouth at bedtime.  . nitroGLYCERIN (NITROSTAT) 0.4 MG SL tablet Place 1 tablet (0.4 mg total) under the tongue every 5 (five) minutes as needed for chest pain.  . pantoprazole (PROTONIX) 40 MG tablet Take 40 mg by mouth daily.  . polyethylene glycol (MIRALAX / GLYCOLAX) packet Take 17 g by mouth daily as needed for moderate constipation.  . tamsulosin (FLOMAX) 0.4 MG CAPS capsule Take 0.4 mg by mouth daily.     Allergies:   Kiwi extract; Flexeril [cyclobenzaprine]; and Tape   Social History   Tobacco Use  . Smoking status: Former Smoker     Packs/day: 1.00    Years: 2.00    Pack years: 2.00    Types: Cigarettes  . Smokeless tobacco: Never Used  . Tobacco comment: quit smoking 40 yrs ago  Substance Use Topics  . Alcohol use: No    Alcohol/week: 0.0 standard drinks  . Drug use: No     Family Hx: The patient's family history includes Cancer in his mother; Cirrhosis in his father; Diabetes in his brother and father; Heart attack in his sister. There is no history of Esophageal cancer, Colon cancer, Pancreatic cancer, or Stomach cancer.  ROS:   Please see the history of present illness.    All other systems reviewed and are negative.   Labs/Other Tests and Data Reviewed:    Recent Labs: 07/12/2018: B Natriuretic Peptide 201.8 07/17/2018: ALT 14; BUN 30; Creatinine, Ser 9.70; Hemoglobin 11.6; Platelets 216; Potassium 4.0; Sodium 138   Recent Lipid Panel Lab Results  Component Value Date/Time   CHOL 130 07/18/2018 02:14 PM   TRIG 74 07/18/2018 02:14 PM   HDL 47 07/18/2018 02:14 PM   CHOLHDL 2.8 07/18/2018 02:14 PM   LDLCALC 68 07/18/2018 02:14 PM    Wt Readings from Last 3 Encounters:  08/08/18 157 lb (71.2 kg)  07/19/18 152 lb 5.4 oz (69.1 kg)  07/12/18 160 lb 0.2 oz (72.6 kg)     Exam:    Vital Signs:  BP (!) 170/76   Pulse 83   Ht 5\' 11"  (1.803 m)   Wt 157 lb (71.2 kg)   BMI 21.90 kg/m    Well nourished, well developed AA male in no acute distress.  ASSESSMENT & PLAN:    Chest pain- Non cardiac-suspect this is GI  CABG x 1 - LIMA-LAD at [facility] 2018, cath jan 2019- patent graft, 40% CFX  HTN- Difficult to control  PVD- H/O Lt SFA PTA, moderate LICA disease by doppler March 2020    COVID-19 Education: The signs and symptoms of COVID-19 were discussed with the patient and how to seek care for testing (follow up with PCP or arrange E-visit).  The importance of social distancing was discussed today.  Patient Risk:   After full review of this patients clinical status, I feel that they are  at least moderate risk at this time.  Time:   Today, I have spent 15 minutes with the patient with  telehealth technology discussing chest pain.     Medication Adjustments/Labs and Tests Ordered: Current medicines are reviewed at length with the patient today.  Concerns regarding medicines are outlined above.  Tests Ordered: No orders of the defined types were placed in this encounter.  Medication Changes: No orders of the defined types were placed in this encounter.   Disposition:  Same cardiac Rx- F/U with Dr Gwenlyn Found in Oct  Signed, Kenady Doxtater, [place]  08/08/2018 9:51 AM    [place]

## 2018-08-22 ENCOUNTER — Ambulatory Visit: Payer: Medicare Other | Admitting: Podiatry

## 2018-08-23 ENCOUNTER — Emergency Department (HOSPITAL_COMMUNITY): Payer: Medicare Other

## 2018-08-23 ENCOUNTER — Other Ambulatory Visit: Payer: Self-pay

## 2018-08-23 ENCOUNTER — Emergency Department (HOSPITAL_COMMUNITY)
Admission: EM | Admit: 2018-08-23 | Discharge: 2018-08-23 | Disposition: A | Discharge status: Home / Self Care | Payer: Medicare Other | Attending: Emergency Medicine | Admitting: Emergency Medicine

## 2018-08-23 ENCOUNTER — Encounter (HOSPITAL_COMMUNITY): Payer: Self-pay | Admitting: Emergency Medicine

## 2018-08-23 DIAGNOSIS — I132 Hypertensive heart and chronic kidney disease with heart failure and with stage 5 chronic kidney disease, or end stage renal disease: Secondary | ICD-10-CM | POA: Insufficient documentation

## 2018-08-23 DIAGNOSIS — Z992 Dependence on renal dialysis: Secondary | ICD-10-CM | POA: Diagnosis not present

## 2018-08-23 DIAGNOSIS — Z87891 Personal history of nicotine dependence: Secondary | ICD-10-CM | POA: Diagnosis not present

## 2018-08-23 DIAGNOSIS — Z7902 Long term (current) use of antithrombotics/antiplatelets: Secondary | ICD-10-CM | POA: Diagnosis not present

## 2018-08-23 DIAGNOSIS — R0602 Shortness of breath: Secondary | ICD-10-CM | POA: Insufficient documentation

## 2018-08-23 DIAGNOSIS — I10 Essential (primary) hypertension: Secondary | ICD-10-CM | POA: Diagnosis not present

## 2018-08-23 DIAGNOSIS — Z79899 Other long term (current) drug therapy: Secondary | ICD-10-CM | POA: Insufficient documentation

## 2018-08-23 DIAGNOSIS — R0789 Other chest pain: Secondary | ICD-10-CM | POA: Diagnosis not present

## 2018-08-23 DIAGNOSIS — E1122 Type 2 diabetes mellitus with diabetic chronic kidney disease: Secondary | ICD-10-CM | POA: Diagnosis not present

## 2018-08-23 DIAGNOSIS — Z951 Presence of aortocoronary bypass graft: Secondary | ICD-10-CM | POA: Diagnosis not present

## 2018-08-23 DIAGNOSIS — N186 End stage renal disease: Secondary | ICD-10-CM | POA: Diagnosis not present

## 2018-08-23 DIAGNOSIS — I5032 Chronic diastolic (congestive) heart failure: Secondary | ICD-10-CM | POA: Insufficient documentation

## 2018-08-23 DIAGNOSIS — Z7982 Long term (current) use of aspirin: Secondary | ICD-10-CM | POA: Insufficient documentation

## 2018-08-23 DIAGNOSIS — R072 Precordial pain: Secondary | ICD-10-CM

## 2018-08-23 DIAGNOSIS — I2510 Atherosclerotic heart disease of native coronary artery without angina pectoris: Secondary | ICD-10-CM | POA: Insufficient documentation

## 2018-08-23 LAB — CBC WITH DIFFERENTIAL/PLATELET
Abs Immature Granulocytes: 0.01 10*3/uL (ref 0.00–0.07)
Basophils Absolute: 0 10*3/uL (ref 0.0–0.1)
Basophils Relative: 0 %
Eosinophils Absolute: 0.4 10*3/uL (ref 0.0–0.5)
Eosinophils Relative: 6 %
HCT: 36.6 % — ABNORMAL LOW (ref 39.0–52.0)
Hemoglobin: 11.4 g/dL — ABNORMAL LOW (ref 13.0–17.0)
Immature Granulocytes: 0 %
Lymphocytes Relative: 37 %
Lymphs Abs: 2.3 10*3/uL (ref 0.7–4.0)
MCH: 28.3 pg (ref 26.0–34.0)
MCHC: 31.1 g/dL (ref 30.0–36.0)
MCV: 90.8 fL (ref 80.0–100.0)
Monocytes Absolute: 0.6 10*3/uL (ref 0.1–1.0)
Monocytes Relative: 9 %
Neutro Abs: 2.9 10*3/uL (ref 1.7–7.7)
Neutrophils Relative %: 48 %
Platelets: 205 10*3/uL (ref 150–400)
RBC: 4.03 MIL/uL — ABNORMAL LOW (ref 4.22–5.81)
RDW: 17.6 % — ABNORMAL HIGH (ref 11.5–15.5)
WBC: 6.2 10*3/uL (ref 4.0–10.5)
nRBC: 0 % (ref 0.0–0.2)

## 2018-08-23 LAB — COMPREHENSIVE METABOLIC PANEL
ALT: 14 U/L (ref 0–44)
AST: 13 U/L — ABNORMAL LOW (ref 15–41)
Albumin: 3.5 g/dL (ref 3.5–5.0)
Alkaline Phosphatase: 67 U/L (ref 38–126)
Anion gap: 14 (ref 5–15)
BUN: 47 mg/dL — ABNORMAL HIGH (ref 8–23)
CO2: 29 mmol/L (ref 22–32)
Calcium: 9.4 mg/dL (ref 8.9–10.3)
Chloride: 95 mmol/L — ABNORMAL LOW (ref 98–111)
Creatinine, Ser: 9.46 mg/dL — ABNORMAL HIGH (ref 0.61–1.24)
GFR calc Af Amer: 6 mL/min — ABNORMAL LOW (ref >60)
GFR calc non Af Amer: 5 mL/min — ABNORMAL LOW (ref >60)
Glucose, Bld: 147 mg/dL — ABNORMAL HIGH (ref 70–99)
Potassium: 3.9 mmol/L (ref 3.5–5.1)
Sodium: 138 mmol/L (ref 135–145)
Total Bilirubin: 0.6 mg/dL (ref 0.3–1.2)
Total Protein: 7.1 g/dL (ref 6.5–8.1)

## 2018-08-23 LAB — TROPONIN I
Troponin I: 0.04 ng/mL (ref <0.03)
Troponin I: 0.04 ng/mL (ref <0.03)

## 2018-08-23 MED ORDER — HYDROCODONE-ACETAMINOPHEN 5-325 MG PO TABS
1.0000 | ORAL_TABLET | Freq: Once | ORAL | Status: AC
  Administered 2018-08-23: 1 via ORAL
  Filled 2018-08-23: qty 1

## 2018-08-23 MED ORDER — OXYCODONE-ACETAMINOPHEN 5-325 MG PO TABS: 1.0000 | ORAL_TABLET | Freq: Once | ORAL | Status: DC

## 2018-08-23 NOTE — ED Notes (Signed)
Nurse Navigator communication: Spoke with the Wife who was concerned about d/c instructions. She agrees to the discharge instructions and plan for dialysis as soon as possible. She states " my daughter will pick the patient up at d/c and she greatly appreciates everything that we are doing."   The Primary RN is currently on the telephone with Dialysis center to arrange an appointment.

## 2018-08-23 NOTE — ED Notes (Signed)
Date and time results received: 08/23/18 845  (use smartphrase ".now" to insert current time)  Test: Trop Critical Value: 0.04  Name of Provider Notified: [place]  Orders Received? Or Actions Taken?: .

## 2018-08-23 NOTE — ED Provider Notes (Signed)
Medical screening examination/treatment/procedure(s) were conducted as a shared visit with non-physician practitioner(s) and myself.  I personally evaluated the patient during the encounter. Briefly, the patient is a 68 y.o. male with history of CAD, hypertension, high cholesterol, diabetes, end-stage renal disease who presents the ED with chest pain.  Patient with normal vitals.  No fever.  Patient EKG does show sinus rhythm.  No ischemic changes.  Unchanged from prior EKG.  Patient states that he has chest pain several times a week.  Had a recent admission last month for cardiac work-up which was overall unremarkable.  Patient states that pain started several hours ago.  Was supposed to go to dialysis today but came to the ED instead for his chest pain.  He does not appear to have any signs of volume overload on exam.  Follows with cardiology outpatient.  Last heart catheterization was about 16 months ago that showed stable disease.  Patient to receive lab work.  Does not have any infectious symptoms.  Will likely touch base with cardiology once troponin is back.  Troponin is elevated.  Otherwise lab work appears to be at baseline.  Cardiology consulted for recommendations given that patient has had intermittent chest pain.  Significant cardiac history.  Has had multiple ED visits and hospitalizations for cardiac work-up in the past but has not had a catheterization done since last January.  Disposition per cardiology.  This chart was dictated using voice recognition software.  Despite best efforts to proofread,  errors can occur which can change the documentation meaning.     EKG Interpretation  Date/Time:  Friday Aug 23 2018 07:24:53 EDT Ventricular Rate:  75 PR Interval:    QRS Duration: 90 QT Interval:  397 QTC Calculation: 444 R Axis:   23 Text Interpretation:  Sinus rhythm No significant change since last tracing Confirmed by Lennice Sites [phone]) on 08/23/2018 7:26:37 AM           Lennice Sites, DO 08/23/18 0160

## 2018-08-23 NOTE — ED Provider Notes (Signed)
[facility] EMERGENCY DEPARTMENT Provider Note   CSN: [id number] Arrival date & time: 08/23/18  0715    History   Chief Complaint Chief Complaint  Patient presents with  . Shortness of Breath  . Chest Pain    HPI Jon GOEDEN is a 68 y.o. male with a past medical history of CAD, ESRD on dialysis MWF, GERD, HTN, PAD, diabetes, who presents to ED for 2-day history of shortness of breath and central chest pain.  States that the symptoms got worse approximately 3 hours prior to arrival.  States that because of the shortness of breath and chest pain, he has had trouble sleeping in the past 2 days.  He has not missed any dialysis sessions this week and is scheduled for dialysis today.  States that this feels similar to chest pain in the past.  He tried taking some Gas-X with no improvement in his symptoms.  He denies any leg swelling, cough, fever, hemoptysis, abdominal pain, nausea, vomiting.  He is followed by Dr. Gwenlyn Found cardiology.  Cardiac catheterization from 04/2017 reviewed. Proximal LAD lesion 75%. LIMA-LAD widely patent. LVEDP 0-3 mmHg. Non-obstructive disease in other coronary arteries.  TTE from 03/2017 reviewed. LVEF=55%. Moderate LVH. Severe mitral annular calcification with mild mitral stenosis. Severe LA dilatation. Mild TR.    HPI  Past Medical History:  Diagnosis Date  . Anemia of chronic disease   . CAD (coronary artery disease)    a.  Myoview 4/11: EF 53%, no scar or ischemia   c. MV 2012 Nl perfusion, apical thinning.  No ischemia or scar.  EF 49%, appears greater by visual estimate.;  d.  Dob stress echo 12/13:  Negative Dob stress echo. There is no evidence of ischemia.  The LVF is normal. b. Normal cors 2016.  . Carotid stenosis    a. <60% RICA, >63% LICA by duplex 0/1601  . Chronic chest pain    occ  . ESRD (end stage renal disease) on dialysis ([place])    M-W-F  . GERD (gastroesophageal reflux disease)   . HNP (herniated nucleus pulposus),  lumbar   . HTN (hypertension)    echo 3/10: EF 60%, LAE  . Hyperlipidemia   . Nephrolithiasis    "passed them all"  . Peripheral arterial disease ([place])    a. s/p PTCA to L SFA.  Marland Kitchen Pneumonia   . Restless legs   . Sleep apnea    no cpap, needs to reschedule appointment to set up aquiring cpap  . Snores    a. presumed OSA, pt has refused sleep eval in past.  . Type II diabetes mellitus ([place])    no longer on medications, checks blood glucose at home    Patient Active Problem List   Diagnosis Date Noted  . DM neuropathy with neurologic complication ([place]) 09/32/3557  . GERD (gastroesophageal reflux disease) 02/28/2018  . Nephrolithiasis 02/28/2018  . Sciatic leg pain 02/28/2018  . Snores 02/28/2018  . Orthopnea 02/23/2018  . Elevated troponin 02/23/2018  . HNP (herniated nucleus pulposus), lumbar 07/03/2017  . Chest pain in adult 04/27/2017  . Restless leg syndrome, uncontrolled 04/27/2017  . 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 Anemia of chronic disease 03/17/2017  . ESRD (end stage renal disease) on dialysis ([place]) 03/17/2017  . Chronic chest pain 03/17/2017  . Type II diabetes mellitus ([place]) 03/17/2017  . Acute on chronic diastolic heart failure ([place]) 03/17/2017  . Acute heart failure ([place]) 03/17/2017  . Lumbar radiculopathy 01/16/2017  .  Pre-transplant evaluation for kidney transplant 06/15/2016  . Increased frequency of urination 12/17/2015  . Nocturia 12/17/2015  . Chest pain, non-cardiac 10/09/2015  . Acute on chronic renal failure ([place] ) 06/16/2014  . Shoulder pain, left 12/15/2013  . Chest pain 12/15/2013  . Claudication ([place]) 12/11/2013  . PVD (peripheral vascular disease) ([place]) 12/11/2013  . Carotid artery disease ([place]) 09/30/2013  . Acute chest pain 11/15/2012  . Bruit 09/15/2010  . CAD (coronary artery disease) nonobstructive per cath 2012   . DM (diabetes mellitus) ([place]) 07/22/2009  . Hyperlipidemia LDL goal <70 07/22/2009  . Essential hypertension  07/22/2009    Past Surgical History:  Procedure Laterality Date  . ANGIOPLASTY / STENTING FEMORAL Left 12/11/2013   dr berry  . AV FISTULA PLACEMENT Left 03/19/2014   Procedure: CREATION OF ARTERIOVENOUS (AV) FISTULA  LEFT UPPER ARM;  Surgeon: Mal Misty, MD;  Location: [facility];  Service: Vascular;  Laterality: Left;  . CARDIAC CATHETERIZATION  2001 and 2010   . COLONOSCOPY W/ BIOPSIES AND POLYPECTOMY    . COLONOSCOPY WITH PROPOFOL N/A 08/01/2016   Procedure: COLONOSCOPY WITH PROPOFOL;  Surgeon: Carol Ada, MD;  Location: [facility];  Service: Endoscopy;  Laterality: N/A;  . CORONARY ARTERY BYPASS GRAFT  2018  . ESOPHAGOGASTRODUODENOSCOPY (EGD) WITH PROPOFOL N/A 08/01/2016   Procedure: ESOPHAGOGASTRODUODENOSCOPY (EGD) WITH PROPOFOL;  Surgeon: Carol Ada, MD;  Location: [facility];  Service: Endoscopy;  Laterality: N/A;  . FOOT FRACTURE SURGERY Right    ligament repair  . FRACTURE SURGERY    . INGUINAL HERNIA REPAIR Left   . LEFT HEART CATH AND CORS/GRAFTS ANGIOGRAPHY N/A 04/27/2017   Procedure: LEFT HEART CATH AND CORS/GRAFTS ANGIOGRAPHY;  Surgeon: Leonie Man, MD;  Location: [facility];  Service: Cardiovascular;  Laterality: N/A;  . LEFT HEART CATHETERIZATION WITH CORONARY ANGIOGRAM N/A 06/22/2014   Procedure: LEFT HEART CATHETERIZATION WITH CORONARY ANGIOGRAM;  Surgeon: Troy Sine, MD;  Location: [facility] CATH LAB;  Service: Cardiovascular;  Laterality: N/A;  . LOWER EXTREMITY ANGIOGRAM Left 12/11/2013   Procedure: LOWER EXTREMITY ANGIOGRAM;  Surgeon: Lorretta Harp, MD;  Location: [facility] CATH LAB;  Service: Cardiovascular;  Laterality: Left;  . LUMBAR LAMINECTOMY/DECOMPRESSION MICRODISCECTOMY Right 07/03/2017   Procedure: MICRODISCECTOMY LUMBAR FIVE - SACRAL ONE RIGHT;  Surgeon: Consuella Lose, MD;  Location: [facility];  Service: Neurosurgery;  Laterality: Right;  . LUMBAR LAMINECTOMY/DECOMPRESSION MICRODISCECTOMY Right 10/19/2017   Procedure: MICRODISCECTOMY LUMBAR FIVE-  SACRAL 1 ONE ;  Surgeon: Consuella Lose, MD;  Location: [facility];  Service: Neurosurgery;  Laterality: Right;  . TONSILLECTOMY AND ADENOIDECTOMY    . WISDOM TOOTH EXTRACTION          Home Medications    Prior to Admission medications   Medication Sig Start Date End Date Taking? Authorizing Provider  acetaminophen (TYLENOL) 500 MG tablet Take 1,000 mg by mouth every 4 (four) hours as needed for moderate pain or headache.    Yes [provider]  amLODipine (NORVASC) 5 MG tablet Take 1 tablet (5 mg total) by mouth daily for 30 days. 07/20/18 08/23/18 Yes Elodia Florence., MD  aspirin EC 81 MG tablet Take 1 tablet (81 mg total) by mouth daily. 05/28/12  Yes Weaver, Scott T, PA-C  atorvastatin (LIPITOR) 40 MG tablet Take 1 tablet (40 mg total) by mouth daily at 6 PM. 12/13/13  Yes Brett Canales, PA-C  calcium acetate (PHOSLO) 667 MG capsule Take 3 capsules (2,001 mg total) by mouth 3 (three) times daily with  meals. Patient taking differently: Take 1,334 mg by mouth 3 (three) times daily with meals.  10/09/15  Yes Short, Noah Delaine, MD  clopidogrel (PLAVIX) 75 MG tablet Take 75 mg by mouth daily. 07/29/18  Yes [provider]  cyclobenzaprine (FLEXERIL) 10 MG tablet Take 10 mg by mouth 3 (three) times daily. 07/31/18  Yes [provider]  gabapentin (NEURONTIN) 100 MG capsule Take 100 mg by mouth 2 (two) times daily.    Yes [provider]  multivitamin (RENA-VIT) TABS tablet Take 1 tablet by mouth at bedtime. 06/23/14  Yes Domenic Polite, MD  nitroGLYCERIN (NITROSTAT) 0.4 MG SL tablet Place 1 tablet (0.4 mg total) under the tongue every 5 (five) minutes as needed for chest pain. 03/20/12  Yes Barrett, Evelene Croon, PA-C  pantoprazole (PROTONIX) 40 MG tablet Take 40 mg by mouth daily.   Yes [provider]  polyethylene glycol (MIRALAX / GLYCOLAX) packet Take 17 g by mouth daily as needed for moderate constipation. 03/22/17  Yes Aline August, MD  tamsulosin  (FLOMAX) 0.4 MG CAPS capsule Take 0.4 mg by mouth daily. 05/23/18  Yes [provider]  HYDROcodone-acetaminophen (NORCO/VICODIN) 5-325 MG tablet Take 1 tablet by mouth every 4 (four) hours as needed. Patient not taking: Reported on 08/23/2018 06/14/18   Tacy Learn, PA-C    Family History Family History  Problem Relation Age of Onset  . Heart attack Sister        died @ 18  . Cancer Mother        died @ 46; unknown type  . Diabetes Brother        deceased  . Cirrhosis Father        alcohol related  . Diabetes Father   . Esophageal cancer Neg Hx   . Colon cancer Neg Hx   . Pancreatic cancer Neg Hx   . Stomach cancer Neg Hx     Social History Social History   Tobacco Use  . Smoking status: Former Smoker    Packs/day: 1.00    Years: 2.00    Pack years: 2.00    Types: Cigarettes  . Smokeless tobacco: Never Used  . Tobacco comment: quit smoking 40 yrs ago  Substance Use Topics  . Alcohol use: No    Alcohol/week: 0.0 standard drinks  . Drug use: No     Allergies   Kiwi extract; Flexeril [cyclobenzaprine]; and Tape   Review of Systems Review of Systems  Constitutional: Negative for appetite change, chills and fever.  HENT: Negative for ear pain, rhinorrhea, sneezing and sore throat.   Eyes: Negative for photophobia and visual disturbance.  Respiratory: Positive for shortness of breath. Negative for cough, chest tightness and wheezing.   Cardiovascular: Positive for chest pain. Negative for palpitations.  Gastrointestinal: Negative for abdominal pain, blood in stool, constipation, diarrhea, nausea and vomiting.  Genitourinary: Negative for dysuria, hematuria and urgency.  Musculoskeletal: Negative for myalgias.  Skin: Negative for rash.  Neurological: Negative for dizziness, weakness and light-headedness.     Physical Exam Updated Vital Signs BP (!) 158/89   Pulse 72   Temp 98.1 F (36.7 C) (Oral)   Resp 19   Ht 5\' 11"  (1.803 m)   Wt 71.2 kg    SpO2 99%   BMI 21.90 kg/m   Physical Exam Vitals signs and nursing note reviewed.  Constitutional:      General: He is not in acute distress.    Appearance: He is well-developed.  Comments: Nontoxic-appearing no acute distress.  Speaking complete sentences without difficulty.  HENT:     Head: Normocephalic and atraumatic.     Nose: Nose normal.  Eyes:     General: No scleral icterus.       Left eye: No discharge.     Conjunctiva/sclera: Conjunctivae normal.  Neck:     Musculoskeletal: Normal range of motion and neck supple.  Cardiovascular:     Rate and Rhythm: Normal rate and regular rhythm.     Heart sounds: Normal heart sounds. No murmur. No friction rub. No gallop.   Pulmonary:     Effort: Pulmonary effort is normal. No respiratory distress.     Breath sounds: Normal breath sounds.  Chest:     Chest wall: Tenderness present.    Abdominal:     General: Bowel sounds are normal. There is no distension.     Palpations: Abdomen is soft.     Tenderness: There is no abdominal tenderness. There is no guarding.  Musculoskeletal: Normal range of motion.     Comments: No lower extremity edema, erythema or calf tenderness bilaterally.  Skin:    General: Skin is warm and dry.     Findings: No rash.  Neurological:     Mental Status: He is alert.     Motor: No abnormal muscle tone.     Coordination: Coordination normal.      ED Treatments / Results  Labs (all labs ordered are listed, but only abnormal results are displayed) Labs Reviewed  COMPREHENSIVE METABOLIC PANEL - Abnormal; Notable for the following components:      Result Value   Chloride 95 (*)    Glucose, Bld 147 (*)    BUN 47 (*)    Creatinine, Ser 9.46 (*)    AST 13 (*)    GFR calc non Af Amer 5 (*)    GFR calc Af Amer 6 (*)    All other components within normal limits  CBC WITH DIFFERENTIAL/PLATELET - Abnormal; Notable for the following components:   RBC 4.03 (*)    Hemoglobin 11.4 (*)    HCT 36.6  (*)    RDW 17.6 (*)    All other components within normal limits  TROPONIN I - Abnormal; Notable for the following components:   Troponin I 0.04 (*)    All other components within normal limits  TROPONIN I - Abnormal; Notable for the following components:   Troponin I 0.04 (*)    All other components within normal limits    EKG EKG Interpretation  Date/Time:  Friday Aug 23 2018 07:24:53 EDT Ventricular Rate:  75 PR Interval:    QRS Duration: 90 QT Interval:  397 QTC Calculation: 444 R Axis:   23 Text Interpretation:  Sinus rhythm No significant change since last tracing Confirmed by Lennice Sites [phone]) on 08/23/2018 7:26:37 AM   Radiology Dg Chest 2 View  Result Date: 08/23/2018 CLINICAL DATA:  Shortness of breath and chest pain EXAM: CHEST - 2 VIEW COMPARISON:  07/17/2018 FINDINGS: The heart size and mediastinal contours are within normal limits. Both lungs are clear. The visualized skeletal structures are unremarkable. IMPRESSION: No active cardiopulmonary disease. Electronically Signed   By: Inez Catalina M.D.   On: 08/23/2018 08:11    Procedures Procedures (including critical care time)  Medications Ordered in ED Medications  HYDROcodone-acetaminophen (NORCO/VICODIN) 5-325 MG per tablet 1 tablet (1 tablet Oral Given 08/23/18 1234)     Initial Impression / Assessment and Plan /  ED Course  I have reviewed the triage vital signs and the nursing notes.  Pertinent labs & imaging results that were available during my care of the patient were reviewed by me and considered in my medical decision making (see chart for details).        Jon Anderson was evaluated in Emergency Department on 08/23/18 for the symptoms described in the history of present illness. He/she was evaluated in the context of the global COVID-19 pandemic, which necessitated consideration that the patient might be at risk for infection with the SARS-CoV-2 virus that causes COVID-19. Institutional  protocols and algorithms that pertain to the evaluation of patients at risk for COVID-19 are in a state of rapid change based on information released by regulatory bodies including the CDC and federal and state organizations. These policies and algorithms were followed during the patient's care in the ED.  68 year old male with past medical history of hypertension, diabetes, ESRD on dialysis presents to ED for chest pain for the past 2 days which worsened in the past 3 hours.  He is scheduled for dialysis today and admits that he has not missed any sessions this week.  He states history of similar symptoms in the past.  Denies any fever, cough, sick contacts, leg swelling or hemoptysis.  Of note, patient had hospitalization for chest pain rule out approximately 1 month ago.  At the time he was seen by cardiology, determined that his symptoms were due to GI versus fluid overload.  He had negative PE study and negative dissection study.  On today's exam patient does not appear fluid overloaded.  Chest pain is reproducible to palpation in the center of the chest.  His vital signs are reassuring.  He had a cardiac catheterization done last year showing proximal LAD lesion 75%.  EKG today shows sinus rhythm with no changes from prior tracings.  Will obtain screening lab work including troponin, chest x-ray and reassess.  8:54 AM Troponin elevated at 0.04 similar to priors.  CBC unremarkable.  CMP with creatinine at baseline.  Chest x-ray with no acute abnormalities.  Will consult cardiology for further recommendations.  12:12 PM Cardiology was consulted.  Dr. Harrell Gave in to evaluate the patient.  She states that the patient is clear from a cardiac point of view due to his physical exam findings and lab work. See consult note for further detail.  I feel this is reasonable.  His pain is controlled here.  We will have him follow-up with PCP, cardiology and complete regularly scheduled dialysis.  Patient is  hemodynamically stable, in NAD, and able to ambulate in the ED. Evaluation does not show pathology that would require ongoing emergent intervention or inpatient treatment. I explained the diagnosis to the patient. Pain has been managed and has no complaints prior to discharge. Patient is comfortable with above plan and is stable for discharge at this time. All questions were answered prior to disposition. Strict return precautions for returning to the ED were discussed. Encouraged follow up with PCP.   An After Visit Summary was printed and given to the patient.   Portions of this note were generated with [employer]. Dictation errors may occur despite best attempts at proofreading.  Final Clinical Impressions(s) / ED Diagnoses   Final diagnoses:  Chest wall pain    ED Discharge Orders    None       Delia Heady, PA-C 08/23/18 [place], Adam, DO 08/23/18 1400

## 2018-08-23 NOTE — Consult Note (Signed)
Cardiology Consultation:   Patient ID: Jon Anderson; [id number]; [birth year]   Admit date: 08/23/2018 Date of Consult: 08/23/2018  Primary Care Provider: [name], MD Primary Cardiologist: Dr. [name], MD   Patient Profile:   Jon Anderson is a 68 y.o. male with a hx of coronary artery disease, found to have severe 75% long area of stenosis of the proximal to mid LAD during transplant work-up at [facility] (underwent off-pump CABG with LIMA to LAD 2018),  ESRD on HD, HTN, carotid stenosis, PVD, HLD and DMII who is being seen today for the evaluation of chest pain at the request of Dr. Ronnald Nian.  History of Present Illness:   Jon Anderson is a 68yo M with a hx as stated above who presented to [facility] on 08/23/2018 with a 2-day hx of chest pain and SOB which acutely worsened approximately 430 am on day of presentation. He states that he took Gas-EX at home given his symptoms with no improvement. He denies LE swelling, recent cough, fever, chills, N/V, dizziness or syncope. He states that he was to go to HD today, but came to the ED for further evaluation instead.   In the ED, EKG with NSR and no acute ischemic changes, similar to prior tracing from 07/2018. Initial troponin found to be 0.04, consistent with prior hospital admission and in the setting of ESRD. Creatinine at 9.46 today with variable baseline. CXR with no acute cardiopulmomary disease. Per ED notes, the patients chest pain is reproducible on palpation. Vitals are reassuring.    Of note, Jon Anderson was last seen by our service in hospital consultation on 07/18/2018 for chest pain. It was noted that he was followed at both [facility] and by Dr. Gwenlyn Found for CAD and PVD.  He has end-stage renal disease on dialysis and underwent transplant work-up. This demonstrated a severe 75% stenosis of the LAD and he underwent off-pump CABG with LIMA to LAD at [facility] in 2018. Subsequently he had recurrent chest pain  symptoms and underwent a cardiac catheterization in 04/2017 by Dr. Ellyn Hack which demonstrated a patent LIMA to LAD and otherwise noncritical coronary disease with normal LV function. On last hospital presentation, his pain was thought to be very atypical epigastric/mid sternal chest pain with radiation to the left arm described as sharp without any exacerbating or alleviating factors. He had some associated nausea but no vomiting, diaphoresis, SOB or COVID-19 concerning symptoms. Troponin levels were mildly elevated at 0.03-0.04 and considered consistent with medical renal disease in the setting of ESRD on HD. On CT scan distal stomach was noted to be prominently distended with recommendations for GI follow up for further testing and no further cardiac ischemic workup was recommended at that time.   He was then seen in hospital follow up on 08/08/2018 with most significant complaint of numbness and tingling in his left hand in which he was noted to be followed by Dr. Ninfa Linden for what sounds like carpal tunnel syndrome. He was also noted to have been scheduled for an endoscopy and esophageal dilatation with Dr. Benson Norway. It does not appear that this has been performed.   Today, on my evaluation, he has diffuse pain but points to an area around his sternum as the worst. States that when I palpate, this reproduces his pain.   Past Medical History:  Diagnosis Date   Anemia of chronic disease    CAD (coronary artery disease)    a.  Myoview 4/11: EF 53%, no scar  or ischemia   c. MV 2012 Nl perfusion, apical thinning.  No ischemia or scar.  EF 49%, appears greater by visual estimate.;  d.  Dob stress echo 12/13:  Negative Dob stress echo. There is no evidence of ischemia.  The LVF is normal. b. Normal cors 2016.   Carotid stenosis    a. <01% RICA, >75% LICA by duplex 04/256   Chronic chest pain    occ   ESRD (end stage renal disease) on dialysis [facility])    M-W-F   GERD (gastroesophageal reflux disease)      HNP (herniated nucleus pulposus), lumbar    HTN (hypertension)    echo 3/10: EF 60%, LAE   Hyperlipidemia    Nephrolithiasis    "passed them all"   Peripheral arterial disease ([place])    a. s/p PTCA to L SFA.   Pneumonia    Restless legs    Sleep apnea    no cpap, needs to reschedule appointment to set up aquiring cpap   Snores    a. presumed OSA, pt has refused sleep eval in past.   Type II diabetes mellitus ([place])    no longer on medications, checks blood glucose at home    Past Surgical History:  Procedure Laterality Date   ANGIOPLASTY / STENTING FEMORAL Left 12/11/2013   dr berry   AV FISTULA PLACEMENT Left 03/19/2014   Procedure: CREATION OF ARTERIOVENOUS (AV) FISTULA  LEFT UPPER ARM;  Surgeon: Mal Misty, MD;  Location: [place];  Service: Vascular;  Laterality: Left;   CARDIAC CATHETERIZATION  2001 and 2010    COLONOSCOPY W/ BIOPSIES AND POLYPECTOMY     COLONOSCOPY WITH PROPOFOL N/A 08/01/2016   Procedure: COLONOSCOPY WITH PROPOFOL;  Surgeon: Carol Ada, MD;  Location: [facility];  Service: Endoscopy;  Laterality: N/A;   CORONARY ARTERY BYPASS GRAFT  2018   ESOPHAGOGASTRODUODENOSCOPY (EGD) WITH PROPOFOL N/A 08/01/2016   Procedure: ESOPHAGOGASTRODUODENOSCOPY (EGD) WITH PROPOFOL;  Surgeon: Carol Ada, MD;  Location: [facility];  Service: Endoscopy;  Laterality: N/A;   FOOT FRACTURE SURGERY Right    ligament repair   FRACTURE SURGERY     INGUINAL HERNIA REPAIR Left    LEFT HEART CATH AND CORS/GRAFTS ANGIOGRAPHY N/A 04/27/2017   Procedure: LEFT HEART CATH AND CORS/GRAFTS ANGIOGRAPHY;  Surgeon: Leonie Man, MD;  Location: [place] CV LAB;  Service: Cardiovascular;  Laterality: N/A;   LEFT HEART CATHETERIZATION WITH CORONARY ANGIOGRAM N/A 06/22/2014   Procedure: LEFT HEART CATHETERIZATION WITH CORONARY ANGIOGRAM;  Surgeon: Troy Sine, MD;  Location: [facility] CATH LAB;  Service: Cardiovascular;  Laterality: N/A;   LOWER EXTREMITY ANGIOGRAM Left  12/11/2013   Procedure: LOWER EXTREMITY ANGIOGRAM;  Surgeon: Lorretta Harp, MD;  Location: [facility] CATH LAB;  Service: Cardiovascular;  Laterality: Left;   LUMBAR LAMINECTOMY/DECOMPRESSION MICRODISCECTOMY Right 07/03/2017   Procedure: MICRODISCECTOMY LUMBAR FIVE - SACRAL ONE RIGHT;  Surgeon: Consuella Lose, MD;  Location: [place];  Service: Neurosurgery;  Laterality: Right;   LUMBAR LAMINECTOMY/DECOMPRESSION MICRODISCECTOMY Right 10/19/2017   Procedure: MICRODISCECTOMY LUMBAR FIVE- SACRAL 1 ONE ;  Surgeon: Consuella Lose, MD;  Location: [place];  Service: Neurosurgery;  Laterality: Right;   TONSILLECTOMY AND ADENOIDECTOMY     WISDOM TOOTH EXTRACTION       Prior to Admission medications   Medication Sig Start Date End Date Taking? Authorizing Provider  acetaminophen (TYLENOL) 500 MG tablet Take 1,000 mg by mouth every 4 (four) hours as needed for moderate pain or headache.    Yes  [provider]  amLODipine (NORVASC) 5 MG tablet Take 1 tablet (5 mg total) by mouth daily for 30 days. 07/20/18 08/23/18 Yes Elodia Florence., MD  aspirin EC 81 MG tablet Take 1 tablet (81 mg total) by mouth daily. 05/28/12  Yes Weaver, Scott T, PA-C  atorvastatin (LIPITOR) 40 MG tablet Take 1 tablet (40 mg total) by mouth daily at 6 PM. 12/13/13  Yes Brett Canales, PA-C  calcium acetate (PHOSLO) 667 MG capsule Take 3 capsules (2,001 mg total) by mouth 3 (three) times daily with meals. Patient taking differently: Take 1,334 mg by mouth 3 (three) times daily with meals.  10/09/15  Yes Short, Noah Delaine, MD  clopidogrel (PLAVIX) 75 MG tablet Take 75 mg by mouth daily. 07/29/18  Yes [provider]  cyclobenzaprine (FLEXERIL) 10 MG tablet Take 10 mg by mouth 3 (three) times daily. 07/31/18  Yes [provider]  gabapentin (NEURONTIN) 100 MG capsule Take 100 mg by mouth 2 (two) times daily.    Yes [provider]  multivitamin (RENA-VIT) TABS tablet Take 1 tablet by mouth at bedtime.  06/23/14  Yes Domenic Polite, MD  nitroGLYCERIN (NITROSTAT) 0.4 MG SL tablet Place 1 tablet (0.4 mg total) under the tongue every 5 (five) minutes as needed for chest pain. 03/20/12  Yes Barrett, Evelene Croon, PA-C  pantoprazole (PROTONIX) 40 MG tablet Take 40 mg by mouth daily.   Yes [provider]  polyethylene glycol (MIRALAX / GLYCOLAX) packet Take 17 g by mouth daily as needed for moderate constipation. 03/22/17  Yes Aline August, MD  tamsulosin (FLOMAX) 0.4 MG CAPS capsule Take 0.4 mg by mouth daily. 05/23/18  Yes [provider]  HYDROcodone-acetaminophen (NORCO/VICODIN) 5-325 MG tablet Take 1 tablet by mouth every 4 (four) hours as needed. Patient not taking: Reported on 08/23/2018 06/14/18   Tacy Learn, PA-C   Inpatient Medications: Scheduled Meds:  Continuous Infusions:  PRN Meds:  Allergies:    Allergies  Allergen Reactions   Kiwi Extract Itching, Swelling and Other (See Comments)    Lips and face swell- breathing not affected   Flexeril [Cyclobenzaprine]     Hands become flimsy, can not hold things   Tape Other (See Comments)    "Plastic" tape causes blisters!!    Social History:   Social History   Socioeconomic History   Marital status: Married    Spouse name: Not on file   Number of children: Not on file   Years of education: Not on file   Highest education level: Not on file  Occupational History   Not on file  Social Needs   Financial resource strain: Not on file   Food insecurity:    Worry: Not on file    Inability: Not on file   Transportation needs:    Medical: Not on file    Non-medical: Not on file  Tobacco Use   Smoking status: Former Smoker    Packs/day: 1.00    Years: 2.00    Pack years: 2.00    Types: Cigarettes   Smokeless tobacco: Never Used   Tobacco comment: quit smoking 40 yrs ago  Substance and Sexual Activity   Alcohol use: No    Alcohol/week: 0.0 standard drinks   Drug use: No   Sexual  activity: Yes    Birth control/protection: None  Lifestyle   Physical activity:    Days per week: Not on file    Minutes per session: Not on file  Stress: Not on file  Relationships   Social connections:    Talks on phone: Not on file    Gets together: Not on file    Attends religious service: Not on file    Active member of club or organization: Not on file    Attends meetings of clubs or organizations: Not on file    Relationship status: Not on file   Intimate partner violence:    Fear of current or ex partner: Not on file    Emotionally abused: Not on file    Physically abused: Not on file    Forced sexual activity: Not on file  Other Topics Concern   Not on file  Social History Narrative   The patient is a Retail buyer.  He is married and has 2 grown children.  Lives in [place] with his wife.  Denies tobacco, alcohol or IV drug abuse or marijuana or cocaine intake.     Family History:   Family History  Problem Relation Age of Onset   Heart attack Sister        died @ 35   Cancer Mother        died @ 56; unknown type   Diabetes Brother        deceased   Cirrhosis Father        alcohol related   Diabetes Father    Esophageal cancer Neg Hx    Colon cancer Neg Hx    Pancreatic cancer Neg Hx    Stomach cancer Neg Hx    Family Status:  Family Status  Relation Name Status   Sister  (Not Specified)   Mother  (Not Specified)   Brother  (Not Specified)   Father  (Not Specified)   Neg Hx  (Not Specified)   ROS:  Please see the history of present illness.  All other ROS reviewed and negative.     Physical Exam/Data:   Vitals:   08/23/18 1145 08/23/18 1200 08/23/18 1215 08/23/18 1230  BP:  (!) 158/89  (!) 156/88  Pulse: 70 72 82 77  Resp: 18 19 (!) 21 18  Temp:      TempSrc:      SpO2: 98% 99% 96% 96%  Weight:      Height:       No intake or output data in the 24 hours ending 08/23/18 1239 Filed Weights   08/23/18 0727  Weight: 71.2 kg   Body  mass index is 21.9 kg/m.   General:  Well nourished, well developed HEENT: normal Lymph: no adenopathy Neck: no JVD Endocrine:  No thryomegaly Vascular: 4/4 extremity pulses 2+, UE fistula with bruit Cardiac:  normal S1, S2; RRR; no murmur. Tender to palpation across sternum and ribcage Lungs:  clear to auscultation bilaterally, no wheezing, rhonchi or rales  Abd: soft, nontender, no hepatomegaly  Ext: no edema Musculoskeletal:  No deformities, BUE and BLE strength normal and equal Skin: warm and dry  Neuro:  CNs 2-12 intact, no focal abnormalities noted Psych:  Normal affect   EKG:  The EKG was personally reviewed and demonstrates:  NSR, PRWP Telemetry:  Telemetry was personally reviewed and demonstrates:  NSR  Relevant CV Studies:  Echocardiogram 03/18/2017:  - Left ventricle: The cavity size was normal. Wall thickness was   increased in a pattern of moderate LVH. Systolic function was   normal. The estimated ejection fraction was in the range of 55%   to 60%. Wall motion was normal; there were no  regional wall   motion abnormalities. Features are consistent with a pseudonormal   left ventricular filling pattern, with concomitant abnormal   relaxation and increased filling pressure (grade 2 diastolic   dysfunction). - Mitral valve: Severely calcified annulus. The findings are   consistent with mild stenosis. - Left atrium: The atrium was severely dilated.  Cardiac catheterization 04/27/2017:   Prox LAD lesion is 75% stenosed. -Focal lesion that was previously described.  LIMA-LAD is widely patent and is normal in caliber. There is competitive flow.  Otherwise minimal disease throughout. Nothing made greater than 40% in the ostial circumflex.  The left ventricular systolic function is normal. The left ventricular ejection fraction is 50-55% by visual estimate.  LV end diastolic pressure is low. - ~0-3 mmHg  There is no aortic valve stenosis.   Angiographically no  culprit lesion to explain the patient's symptoms.  He does have a significant LAD lesion which is a very focal lesion and easily stent pull, however there is a widely patent LIMA graft distally.  There is actually retrograde filling from the LIMA graft to the diagonal branch which would be the only branch jeopardized by the more upstream LAD lesion. Nothing to explain the patient's symptoms.  In fact, his LVEDP is very low after dialysis.  This would indicate that he was adequately dialyzed and argue against increased LVEDP causing microvascular ischemic symptoms.   At this point, I think the only choice is to continue with his low-dose beta-blocker and nitrate for what still is probably microvascular disease disease. At least we know that with his ongoing symptoms are not likely related to any flow-limiting microvascular lesions.  Anticipate that he can be discharged tomorrow after his bedrest is over and medications have been reassessed.    Carotid US 06/26/2017:  IMPRESSION: Less than 50% stenosis in the right internal carotid artery.  Greater than 70% stenosis in the left internal carotid artery.  Laboratory Data:  Chemistry Recent Labs  Lab 08/23/18 0730  NA 138  K 3.9  CL 95*  CO2 29  GLUCOSE 147*  BUN 47*  CREATININE 9.46*  CALCIUM 9.4  GFRNONAA 5*  GFRAA 6*  ANIONGAP 14    Total Protein  Date Value Ref Range Status  08/23/2018 7.1 6.5 - 8.1 g/dL Final   Albumin  Date Value Ref Range Status  08/23/2018 3.5 3.5 - 5.0 g/dL Final   AST  Date Value Ref Range Status  08/23/2018 13 (L) 15 - 41 U/L Final   ALT  Date Value Ref Range Status  08/23/2018 14 0 - 44 U/L Final   Alkaline Phosphatase  Date Value Ref Range Status  08/23/2018 67 38 - 126 U/L Final   Total Bilirubin  Date Value Ref Range Status  08/23/2018 0.6 0.3 - 1.2 mg/dL Final   Hematology Recent Labs  Lab 08/23/18 0730  WBC 6.2  RBC 4.03*  HGB 11.4*  HCT 36.6*  MCV 90.8  MCH 28.3    MCHC 31.1  RDW 17.6*  PLT 205   Cardiac Enzymes Recent Labs  Lab 08/23/18 0730 08/23/18 0951  TROPONINI 0.04* 0.04*   No results for input(s): TROPIPOC in the last 168 hours.  BNPNo results for input(s): BNP, PROBNP in the last 168 hours.  DDimer No results for input(s): DDIMER in the last 168 hours. TSH:  Lab Results  Component Value Date   TSH 1.240 12/05/2013   Lipids: Lab Results  Component Value Date   CHOL 130 07/18/2018  HDL 47 07/18/2018   LDLCALC 68 07/18/2018   TRIG 74 07/18/2018   CHOLHDL 2.8 07/18/2018   HgbA1c: Lab Results  Component Value Date   HGBA1C 6.5 (H) 07/18/2018    Radiology/Studies:  Dg Chest 2 View  Result Date: 08/23/2018 CLINICAL DATA:  Shortness of breath and chest pain EXAM: CHEST - 2 VIEW COMPARISON:  07/17/2018 FINDINGS: The heart size and mediastinal contours are within normal limits. Both lungs are clear. The visualized skeletal structures are unremarkable. IMPRESSION: No active cardiopulmonary disease. Electronically Signed   By: Inez Catalina M.D.   On: 08/23/2018 08:11   Assessment and Plan:   1.Chest pain with known CAD s/p CABG (LIMA>LAD) 2018): -Presented to [facility] on 08/23/2018 with a 2-day hx of chest pain and SOB which acutely worsened approximately 430 am on day of presentation. He states that he took Gas-EX at home given his symptoms with no improvement. He denies LE swelling, recent cough, fever, chills, N/V, dizziness or syncope.  -EKG with NSR and no acute ischemic changes, similar to prior tracing from 07/2018. Troponin x2 found to be 0.04, consistent with prior hospital admission and in the setting of ESRD. Creatinine at 9.46 today with variable baseline. CXR with no acute cardiopulmomary disease. Patients chest pain is reproducible on palpation. Vitals are reassuring.   -Last seen by our service in hospital consultation on 07/18/2018 for chest pain in which his cardiac workup was found to be negative and was thought to be GI  related  -Was seen in follow up 08/08/2018 and was doing well from a cardiac perspective with pending GI workup (endoscopy and esophageal dilatation) with Dr. Benson Norway  -Last cath in the setting of recurrent chest pain in 2018 with patent LIMA to LAD and otherwise noncritical coronary disease with normal LV function. Previous cath was performed in the setting of renal transplant workup that showed a severe 75% stenosis of the LAD and he underwent off-pump CABG with LIMA to LAD at [facility] in 2018. -Home medications include ASA 81, atorvastatin 40 and Plavix 75  -would keep his routine cardiac follow up schedule  From a cardiovascular perspective, no indication for cardiac admission for inpatient testing at this time.  2. ESRD on HD: -Noted to be on HD Mondays-Wednesdays-Fridays -Will need nephrology consultation if admitted -Creatinine, 9.46  3. HTN: Elevated, 167/92>155/84>169/84 -On home amlodipine 5 -Consider adding carvedilol for greater HTN control, though he is due for dialysis and appears in pain  4. PVD: -Hx of Left SFA PTA with moderate LICA disease per carotid doppler 06/2018.  -Study as above   For questions or updates, please contact [place] Please consult [email or web address] for contact info under Cardiology/STEMI.   Signed, Buford Dresser, MD, PhD [facility]  [address], [place], [place] [address] [phone] 08/23/2018 12:39 PM

## 2018-08-23 NOTE — Discharge Instructions (Addendum)
Follow-up with your primary care provider. The cardiologist that saw you today felt that your symptoms were not due to cardiac issues. Your heart labs today have been consistent and similar to priors as well as your EKG. Return to the ED if you start to have worsening symptoms, leg swelling, coughing up blood, increased shortness of breath.

## 2018-08-23 NOTE — ED Triage Notes (Signed)
Pt arrives to ED with SOB and  chest pain in the mid chest since 0430 this morning. Pt states that  he hasn't slept in two days.

## 2018-09-17 ENCOUNTER — Ambulatory Visit (INDEPENDENT_AMBULATORY_CARE_PROVIDER_SITE_OTHER): Payer: Medicare Other | Admitting: Podiatry

## 2018-09-17 ENCOUNTER — Encounter: Payer: Self-pay | Admitting: Podiatry

## 2018-09-17 ENCOUNTER — Other Ambulatory Visit: Payer: Self-pay

## 2018-09-17 VITALS — Temp 97.9°F

## 2018-09-17 DIAGNOSIS — E1151 Type 2 diabetes mellitus with diabetic peripheral angiopathy without gangrene: Secondary | ICD-10-CM

## 2018-09-17 DIAGNOSIS — B351 Tinea unguium: Secondary | ICD-10-CM

## 2018-09-17 DIAGNOSIS — M79676 Pain in unspecified toe(s): Secondary | ICD-10-CM | POA: Diagnosis not present

## 2018-09-17 DIAGNOSIS — L97511 Non-pressure chronic ulcer of other part of right foot limited to breakdown of skin: Secondary | ICD-10-CM | POA: Diagnosis not present

## 2018-09-17 DIAGNOSIS — L97521 Non-pressure chronic ulcer of other part of left foot limited to breakdown of skin: Secondary | ICD-10-CM

## 2018-09-17 DIAGNOSIS — E08621 Diabetes mellitus due to underlying condition with foot ulcer: Secondary | ICD-10-CM | POA: Diagnosis not present

## 2018-09-17 MED ORDER — CLOTRIMAZOLE 1 % EX CREA: TOPICAL_CREAM | CUTANEOUS | 1 refills | Status: DC

## 2018-09-17 MED ORDER — MUPIROCIN 2 % EX OINT: TOPICAL_OINTMENT | CUTANEOUS | 0 refills | Status: AC

## 2018-09-17 NOTE — Patient Instructions (Addendum)
DRESSING CHANGES BOTH FEET:  WEAR SURGICAL SHOE AT ALL TIMES    1. KEEP BOTH FEET DRY AT ALL TIMES!!!!  2. CLEANSE ULCER WITH SALINE.  3. DAB DRY WITH GAUZE SPONGE.  4. APPLY A LIGHT AMOUNT OF MUPIROCIN OINTMENT TO BASE OF ULCER.  5. APPLY OUTER DRESSING/BAND-AID AS INSTRUCTED.  6. WEAR SURGICAL SHOE DAILY AT ALL TIMES.  7. DO NOT WALK BAREFOOT!!!  8.  IF YOU EXPERIENCE ANY FEVER, CHILLS, NIGHTSWEATS, NAUSEA OR VOMITING, ELEVATED OR LOW BLOOD SUGARS, REPORT TO EMERGENCY ROOM.  9. IF YOU EXPERIENCE INCREASED REDNESS, PAIN, SWELLING, DISCOLORATION, ODOR, PUS, DRAINAGE OR WARMTH OF YOUR FOOT, REPORT TO EMERGENCY ROOM.    Diabetes Mellitus and Foot Care   Foot care is an important part of your health, especially when you have diabetes. Diabetes may cause you to have problems because of poor blood flow (circulation) to your feet and legs, which can cause your skin to:  Become thinner and drier.  Break more easily.  Heal more slowly.  Peel and crack. You may also have nerve damage (neuropathy) in your legs and feet, causing decreased feeling in them. This means that you may not notice minor injuries to your feet that could lead to more serious problems. Noticing and addressing any potential problems early is the best way to prevent future foot problems. How to care for your feet Foot hygiene  Wash your feet daily with warm water and mild soap. Do not use hot water. Then, pat your feet and the areas between your toes until they are completely dry. Do not soak your feet as this can dry your skin.  Trim your toenails straight across. Do not dig under them or around the cuticle. File the edges of your nails with an emery board or nail file.  Apply a moisturizing lotion or petroleum jelly to the skin on your feet and to dry, brittle toenails. Use lotion that does not contain alcohol and is unscented. Do not apply lotion between your toes. Shoes and socks  Wear clean socks or  stockings every day. Make sure they are not too tight. Do not wear knee-high stockings since they may decrease blood flow to your legs.  Wear shoes that fit properly and have enough cushioning. Always look in your shoes before you put them on to be sure there are no objects inside.  To break in new shoes, wear them for just a few hours a day. This prevents injuries on your feet. Wounds, scrapes, corns, and calluses  Check your feet daily for blisters, cuts, bruises, sores, and redness. If you cannot see the bottom of your feet, use a mirror or ask someone for help.  Do not cut corns or calluses or try to remove them with medicine.  If you find a minor scrape, cut, or break in the skin on your feet, keep it and the skin around it clean and dry. You may clean these areas with mild soap and water. Do not clean the area with peroxide, alcohol, or iodine.  If you have a wound, scrape, corn, or callus on your foot, look at it several times a day to make sure it is healing and not infected. Check for: ? Redness, swelling, or pain. ? Fluid or blood. ? Warmth. ? Pus or a bad smell. General instructions  Do not cross your legs. This may decrease blood flow to your feet.  Do not use heating pads or hot water bottles on your feet. They  may burn your skin. If you have lost feeling in your feet or legs, you may not know this is happening until it is too late.  Protect your feet from hot and cold by wearing shoes, such as at the beach or on hot pavement.  Schedule a complete foot exam at least once a year (annually) or more often if you have foot problems. If you have foot problems, report any cuts, sores, or bruises to your health care provider immediately. Contact a health care provider if:  You have a medical condition that increases your risk of infection and you have any cuts, sores, or bruises on your feet.  You have an injury that is not healing.  You have redness on your legs or feet.   You feel burning or tingling in your legs or feet.  You have pain or cramps in your legs and feet.  Your legs or feet are numb.  Your feet always feel cold.  You have pain around a toenail. Get help right away if:  You have a wound, scrape, corn, or callus on your foot and: ? You have pain, swelling, or redness that gets worse. ? You have fluid or blood coming from the wound, scrape, corn, or callus. ? Your wound, scrape, corn, or callus feels warm to the touch. ? You have pus or a bad smell coming from the wound, scrape, corn, or callus. ? You have a fever. ? You have a red line going up your leg. Summary  Check your feet every day for cuts, sores, red spots, swelling, and blisters.  Moisturize feet and legs daily.  Wear shoes that fit properly and have enough cushioning.  If you have foot problems, report any cuts, sores, or bruises to your health care provider immediately.  Schedule a complete foot exam at least once a year (annually) or more often if you have foot problems. This information is not intended to replace advice given to you by your health care provider. Make sure you discuss any questions you have with your health care provider. Document Released: 03/24/2000 Document Revised: 05/09/2017 Document Reviewed: 04/28/2016 Elsevier Interactive Patient Education  2019 [employer].

## 2018-09-25 NOTE — Progress Notes (Signed)
Subjective: Patient presents today for at-risk foot care. He has h/o ulceration plantar 5th metatarsals b/l. He states his calluses are tender on today's visit. His last visit was on 07/09/2018. He denies any drainage, fever, chills, night sweats, nausea or vomiting.  [name], MD is his PCP.    Allergies  Allergen Reactions  . Kiwi Extract Itching, Swelling and Other (See Comments)    Lips and face swell- breathing not affected  . Flexeril [Cyclobenzaprine]     Hands become flimsy, can not hold things  . Tape Other (See Comments)    "Plastic" tape causes blisters!!     Objective: Vitals:   09/17/18 0933  Temp: 97.9 F (36.6 C)    Vascular Examination: Capillary refill time <3 seconds x 10 digits.  Dorsalis pedis pulses absent b/l.  Posterior tibial pulses faintly palpable b/l.  Digital hair absent x 10 digits.  Skin temperature gradient WNL b/l.  Dermatological Examination: Skin thin, shiny and atrophic b/l.  Toenails 1-5 b/l discolored, thick, dystrophic with subungual debris and pain with palpation to nailbeds due to thickness of nails.  Ulcer #1: Hyperkeratotic lesion noted submet head 5 left foot with tenderness to palpation. Mild flocculence noted under lesion.No erythema, no edema, no drainage.  Predebridement, measures 1.0 x 1.0 cm. Postdebridement reveals linear partial thickness ulceration with red, granular base.  Measurements 0.5 x 0.2 x 0.1 cm. No probing to bone, no undermining, no active pus or purulence noted, no odor.  No deep abscess, no erythema, no edema, no cellulitis, no odor was encountered.    Ulcer #2: Hyperkeratotic lesion noted submet head 5 right foot with tenderness to palpation. Mild flocculence also noted under this lesion. No erythema, no edema, no drainage.  Predebridement, measures 1.0 x 1.0 cm. Postdebridement reveals annular partial thickness ulceration with red, granular base.  Measurements 0.3 x 0.3 x 0.1 cm. No probing to bone, no  undermining, no active pus or purulence noted, no odor.  No deep abscess, no erythema, no edema, no cellulitis, no odor was encountered.    Musculoskeletal: Muscle strength 5/5 to all LE muscle groups  Neurological: Sensation diminished with 10 gram monofilament.  Vibratory sensation diminished b/l.  Assessment: 1. Painful onychomycosis toenails 1-5 b/l 2. NIDDM with Peripheral arterial disease 3.   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 Toenails 1-5 b/l were debrided in length and girth without iatrogenic bleeding. 2. Ulcers were debrided and reactive hyperkeratoses and necrotic tissue was resected to the level of bleeding or viable tissue. Ulcers cleansed with wound cleanser. Triple antibiotic ointment  was applied to base of wounds with light dressing. 3. Patient was given written instructions on offloading and dressing change/aftercare and was instructed to call immediately if any signs or symptoms of infection arise. Prescription written for Mupirocin Ointment. Patient is to apply to ulcers of both feet once daily and cover with dressing.  4. Patient instructed to report to emergency department with worsening appearance of ulcer/toe/foot, increased pain, foul odor, increased redness, swelling, drainage, fever, chills, nightsweats, nausea, vomiting, increased blood sugar.  5. Patient/POA related understanding. 6. Follow up 2 weeks. 7. Patient/POA to call should there be a concern in the interim.

## 2018-10-18 ENCOUNTER — Other Ambulatory Visit: Payer: Self-pay

## 2018-10-18 ENCOUNTER — Ambulatory Visit (INDEPENDENT_AMBULATORY_CARE_PROVIDER_SITE_OTHER): Payer: Medicare Other | Admitting: Podiatry

## 2018-10-18 ENCOUNTER — Encounter: Payer: Self-pay | Admitting: Podiatry

## 2018-10-18 DIAGNOSIS — E08621 Diabetes mellitus due to underlying condition with foot ulcer: Secondary | ICD-10-CM

## 2018-10-18 DIAGNOSIS — E1151 Type 2 diabetes mellitus with diabetic peripheral angiopathy without gangrene: Secondary | ICD-10-CM

## 2018-10-18 DIAGNOSIS — L97521 Non-pressure chronic ulcer of other part of left foot limited to breakdown of skin: Secondary | ICD-10-CM

## 2018-10-18 DIAGNOSIS — L97511 Non-pressure chronic ulcer of other part of right foot limited to breakdown of skin: Secondary | ICD-10-CM | POA: Diagnosis not present

## 2018-10-18 NOTE — Patient Instructions (Signed)
Diabetes Mellitus and Foot Care Foot care is an important part of your health, especially when you have diabetes. Diabetes may cause you to have problems because of poor blood flow (circulation) to your feet and legs, which can cause your skin to:  Become thinner and drier.  Break more easily.  Heal more slowly.  Peel and crack. You may also have nerve damage (neuropathy) in your legs and feet, causing decreased feeling in them. This means that you may not notice minor injuries to your feet that could lead to more serious problems. Noticing and addressing any potential problems early is the best way to prevent future foot problems. How to care for your feet Foot hygiene  Wash your feet daily with warm water and mild soap. Do not use hot water. Then, pat your feet and the areas between your toes until they are completely dry. Do not soak your feet as this can dry your skin.  Trim your toenails straight across. Do not dig under them or around the cuticle. File the edges of your nails with an emery board or nail file.  Apply a moisturizing lotion or petroleum jelly to the skin on your feet and to dry, brittle toenails. Use lotion that does not contain alcohol and is unscented. Do not apply lotion between your toes. Shoes and socks  Wear clean socks or stockings every day. Make sure they are not too tight. Do not wear knee-high stockings since they may decrease blood flow to your legs.  Wear shoes that fit properly and have enough cushioning. Always look in your shoes before you put them on to be sure there are no objects inside.  To break in new shoes, wear them for just a few hours a day. This prevents injuries on your feet. Wounds, scrapes, corns, and calluses  Check your feet daily for blisters, cuts, bruises, sores, and redness. If you cannot see the bottom of your feet, use a mirror or ask someone for help.  Do not cut corns or calluses or try to remove them with medicine.  If you  find a minor scrape, cut, or break in the skin on your feet, keep it and the skin around it clean and dry. You may clean these areas with mild soap and water. Do not clean the area with peroxide, alcohol, or iodine.  If you have a wound, scrape, corn, or callus on your foot, look at it several times a day to make sure it is healing and not infected. Check for: ? Redness, swelling, or pain. ? Fluid or blood. ? Warmth. ? Pus or a bad smell. General instructions  Do not cross your legs. This may decrease blood flow to your feet.  Do not use heating pads or hot water bottles on your feet. They may burn your skin. If you have lost feeling in your feet or legs, you may not know this is happening until it is too late.  Protect your feet from hot and cold by wearing shoes, such as at the beach or on hot pavement.  Schedule a complete foot exam at least once a year (annually) or more often if you have foot problems. If you have foot problems, report any cuts, sores, or bruises to your health care provider immediately. Contact a health care provider if:  You have a medical condition that increases your risk of infection and you have any cuts, sores, or bruises on your feet.  You have an injury that is not   healing.  You have redness on your legs or feet.  You feel burning or tingling in your legs or feet.  You have pain or cramps in your legs and feet.  Your legs or feet are numb.  Your feet always feel cold.  You have pain around a toenail. Get help right away if:  You have a wound, scrape, corn, or callus on your foot and: ? You have pain, swelling, or redness that gets worse. ? You have fluid or blood coming from the wound, scrape, corn, or callus. ? Your wound, scrape, corn, or callus feels warm to the touch. ? You have pus or a bad smell coming from the wound, scrape, corn, or callus. ? You have a fever. ? You have a red line going up your leg. Summary  Check your feet every day  for cuts, sores, red spots, swelling, and blisters.  Moisturize feet and legs daily.  Wear shoes that fit properly and have enough cushioning.  If you have foot problems, report any cuts, sores, or bruises to your health care provider immediately.  Schedule a complete foot exam at least once a year (annually) or more often if you have foot problems. This information is not intended to replace advice given to you by your health care provider. Make sure you discuss any questions you have with your health care provider. Document Released: 03/24/2000 Document Revised: 05/09/2017 Document Reviewed: 04/28/2016 Elsevier Patient Education  2020 [employer]   Onychomycosis/Fungal Toenails  WHAT IS IT? An infection that lies within the keratin of your nail plate that is caused by a fungus.  WHY ME? Fungal infections affect all ages, sexes, races, and creeds.  There may be many factors that predispose you to a fungal infection such as age, coexisting medical conditions such as diabetes, or an autoimmune disease; stress, medications, fatigue, genetics, etc.  Bottom line: fungus thrives in a warm, moist environment and your shoes offer such a location.  IS IT CONTAGIOUS? Theoretically, yes.  You do not want to share shoes, nail clippers or files with someone who has fungal toenails.  Walking around barefoot in the same room or sleeping in the same bed is unlikely to transfer the organism.  It is important to realize, however, that fungus can spread easily from one nail to the next on the same foot.  HOW DO WE TREAT THIS?  There are several ways to treat this condition.  Treatment may depend on many factors such as age, medications, pregnancy, liver and kidney conditions, etc.  It is best to ask your doctor which options are available to you.  1. No treatment.   Unlike many other medical concerns, you can live with this condition.  However for many people this can be a painful condition and may lead to  ingrown toenails or a bacterial infection.  It is recommended that you keep the nails cut short to help reduce the amount of fungal nail. 2. Topical treatment.  These range from herbal remedies to prescription strength nail lacquers.  About 40-50% effective, topicals require twice daily application for approximately 9 to 12 months or until an entirely new nail has grown out.  The most effective topicals are medical grade medications available through physicians offices. 3. Oral antifungal medications.  With an 80-90% cure rate, the most common oral medication requires 3 to 4 months of therapy and stays in your system for a year as the new nail grows out.  Oral antifungal medications do require   blood work to make sure it is a safe drug for you.  A liver function panel will be performed prior to starting the medication and after the first month of treatment.  It is important to have the blood work performed to avoid any harmful side effects.  In general, this medication safe but blood work is required. 4. Laser Therapy.  This treatment is performed by applying a specialized laser to the affected nail plate.  This therapy is noninvasive, fast, and non-painful.  It is not covered by insurance and is therefore, out of pocket.  The results have been very good with a 80-95% cure rate.  [facility] is the only practice in the area to offer this therapy. 5. Permanent Nail Avulsion.  Removing the entire nail so that a new nail will not grow back. 

## 2018-10-20 NOTE — Progress Notes (Signed)
Subjective: Jon Anderson is a 69 y.o. y.o. male who presents for preventative foot care follow up b/l ulcerations submet head 5 b/l. He states the ulcers are healed. He is having pain due to buildup over the areas b/l.  [name], MD is his PCP.    Current Outpatient Medications:  .  acetaminophen (TYLENOL) 500 MG tablet, Take 1,000 mg by mouth every 4 (four) hours as needed for moderate pain or headache. , Disp: , Rfl:  .  amLODipine (NORVASC) 5 MG tablet, Take 1 tablet (5 mg total) by mouth daily for 30 days., Disp: 30 tablet, Rfl: 0 .  aspirin EC 81 MG tablet, Take 1 tablet (81 mg total) by mouth daily., Disp: , Rfl:  .  atorvastatin (LIPITOR) 40 MG tablet, Take 1 tablet (40 mg total) by mouth daily at 6 PM., Disp: 30 tablet, Rfl: 5 .  calcium acetate (PHOSLO) 667 MG capsule, Take 3 capsules (2,001 mg total) by mouth 3 (three) times daily with meals. (Patient taking differently: Take 1,334 mg by mouth 3 (three) times daily with meals. ), Disp: 270 capsule, Rfl: 0 .  clopidogrel (PLAVIX) 75 MG tablet, Take 75 mg by mouth daily., Disp: , Rfl:  .  clotrimazole (LOTRIMIN) 1 % cream, Apply between toes and under toes twice daily for 4 weeks., Disp: 30 g, Rfl: 1 .  cyclobenzaprine (FLEXERIL) 10 MG tablet, Take 10 mg by mouth 3 (three) times daily., Disp: , Rfl:  .  gabapentin (NEURONTIN) 100 MG capsule, Take 100 mg by mouth 2 (two) times daily. , Disp: , Rfl:  .  HYDROcodone-acetaminophen (NORCO/VICODIN) 5-325 MG tablet, Take 1 tablet by mouth every 4 (four) hours as needed., Disp: 10 tablet, Rfl: 0 .  multivitamin (RENA-VIT) TABS tablet, Take 1 tablet by mouth at bedtime., Disp: 30 tablet, Rfl: 0 .  neomycin-polymyxin b-dexamethasone (MAXITROL) 3.5-10000-0.1 SUSP, PLACE 2 DROPS IN THE OS BID, Disp: , Rfl:  .  nitroGLYCERIN (NITROSTAT) 0.4 MG SL tablet, Place 1 tablet (0.4 mg total) under the tongue every 5 (five) minutes as needed for chest pain., Disp: 25 tablet, Rfl: 3 .  oxyCODONE (OXY  IR/ROXICODONE) 5 MG immediate release tablet, Take 5 mg by mouth every 6 (six) hours as needed., Disp: , Rfl:  .  pantoprazole (PROTONIX) 40 MG tablet, Take 40 mg by mouth daily., Disp: , Rfl:  .  polyethylene glycol (MIRALAX / GLYCOLAX) packet, Take 17 g by mouth daily as needed for moderate constipation., Disp: 14 each, Rfl: 0 .  rOPINIRole (REQUIP) 0.5 MG tablet, Take 0.5 mg by mouth 2 (two) times daily., Disp: , Rfl:  .  tamsulosin (FLOMAX) 0.4 MG CAPS capsule, Take 0.4 mg by mouth daily., Disp: , Rfl:   Allergies  Allergen Reactions  . Kiwi Extract Itching, Swelling and Other (See Comments)    Lips and face swell- breathing not affected  . Flexeril [Cyclobenzaprine]     Hands become flimsy, can not hold things  . Tape Other (See Comments)    "Plastic" tape causes blisters!!    Objective:  Vascular Examination: Capillary refill time <3 seconds x 10 digits.  Dorsalis pedis pulses absent b/l.  Posterior tibial pulses faintly palpable b/l.  No digital hair x 10 digits.  Skin temperature gradient WNL b/l.  Dermatological Examination: Skin thin, shiny and atrophic b/l.  Toenails 1-5 b/l show evidence of recent debridement.  Ulceration located submet head 1 left foot.  Predebridement measurements carried out today of 1.0 x 1.0 cm  with hyperkeratotic roof.  No erythema, no edema, no drainage, no flocculence.  Postdebridement, ulcer is completely healed/epithelialized.   Ulceration located submet head 5 right foot.  Predebridement measurements carried out today of 1.0 x 1.0 cm.  No erythema, no edema, no drainage, no flocculence.  Postdebridement, ulcer is completely healed/epithelialized.  Musculoskeletal: Muscle strength 5/5 to all LE muscle groups.  Neurological: Sensation diminished b/ with 10 gram monofilament.  Vibratory sensation diminished b/l.  Assessment: 1. Painful onychomycosis toenails 1-5 b/l 2. Calluses submet head 5 b/l 3. NIDDM with Peripheral  arterial disease  Plan: 1. Discuss diabetic foot care principles. Literature dispensed. 2. Ulcerations completely healed. Hyperkeratotic lesion(s)  pared with sterile blade and gently smoothed with burr. 3. Patient to continue diabetic shoes daily. 4. Patient to report any pedal injuries to medical professional immediately. 5. Follow up 2 weeks and I would like him to see Velora Heckler for additional shoe modification of b/l submetatarsal head 5 lesions as well.  6. Patient/POA to call should there be a concern in the interim.

## 2018-10-29 ENCOUNTER — Other Ambulatory Visit: Payer: Medicare Other | Admitting: Orthotics

## 2018-11-04 ENCOUNTER — Emergency Department (HOSPITAL_COMMUNITY): Payer: Medicare Other

## 2018-11-04 ENCOUNTER — Other Ambulatory Visit: Payer: Self-pay

## 2018-11-04 ENCOUNTER — Emergency Department (HOSPITAL_COMMUNITY)
Admission: EM | Admit: 2018-11-04 | Discharge: 2018-11-04 | Disposition: A | Discharge status: Home / Self Care | Payer: Medicare Other | Attending: Emergency Medicine | Admitting: Emergency Medicine

## 2018-11-04 DIAGNOSIS — N186 End stage renal disease: Secondary | ICD-10-CM | POA: Insufficient documentation

## 2018-11-04 DIAGNOSIS — Z87891 Personal history of nicotine dependence: Secondary | ICD-10-CM | POA: Diagnosis not present

## 2018-11-04 DIAGNOSIS — R0781 Pleurodynia: Secondary | ICD-10-CM | POA: Insufficient documentation

## 2018-11-04 DIAGNOSIS — Z7902 Long term (current) use of antithrombotics/antiplatelets: Secondary | ICD-10-CM | POA: Diagnosis not present

## 2018-11-04 DIAGNOSIS — Z7982 Long term (current) use of aspirin: Secondary | ICD-10-CM | POA: Diagnosis not present

## 2018-11-04 DIAGNOSIS — I5030 Unspecified diastolic (congestive) heart failure: Secondary | ICD-10-CM | POA: Insufficient documentation

## 2018-11-04 DIAGNOSIS — E1122 Type 2 diabetes mellitus with diabetic chronic kidney disease: Secondary | ICD-10-CM | POA: Diagnosis not present

## 2018-11-04 DIAGNOSIS — Z79899 Other long term (current) drug therapy: Secondary | ICD-10-CM | POA: Diagnosis not present

## 2018-11-04 DIAGNOSIS — Z951 Presence of aortocoronary bypass graft: Secondary | ICD-10-CM | POA: Insufficient documentation

## 2018-11-04 DIAGNOSIS — I2510 Atherosclerotic heart disease of native coronary artery without angina pectoris: Secondary | ICD-10-CM | POA: Insufficient documentation

## 2018-11-04 DIAGNOSIS — I132 Hypertensive heart and chronic kidney disease with heart failure and with stage 5 chronic kidney disease, or end stage renal disease: Secondary | ICD-10-CM | POA: Insufficient documentation

## 2018-11-04 DIAGNOSIS — R079 Chest pain, unspecified: Secondary | ICD-10-CM | POA: Diagnosis present

## 2018-11-04 LAB — COMPREHENSIVE METABOLIC PANEL
ALT: 14 U/L (ref 0–44)
AST: 17 U/L (ref 15–41)
Albumin: 3.7 g/dL (ref 3.5–5.0)
Alkaline Phosphatase: 70 U/L (ref 38–126)
Anion gap: 16 — ABNORMAL HIGH (ref 5–15)
BUN: 19 mg/dL (ref 8–23)
CO2: 27 mmol/L (ref 22–32)
Calcium: 8.8 mg/dL — ABNORMAL LOW (ref 8.9–10.3)
Chloride: 94 mmol/L — ABNORMAL LOW (ref 98–111)
Creatinine, Ser: 7.48 mg/dL — ABNORMAL HIGH (ref 0.61–1.24)
GFR calc Af Amer: 8 mL/min — ABNORMAL LOW (ref >60)
GFR calc non Af Amer: 7 mL/min — ABNORMAL LOW (ref >60)
Glucose, Bld: 132 mg/dL — ABNORMAL HIGH (ref 70–99)
Potassium: 3.2 mmol/L — ABNORMAL LOW (ref 3.5–5.1)
Sodium: 137 mmol/L (ref 135–145)
Total Bilirubin: 0.4 mg/dL (ref 0.3–1.2)
Total Protein: 7 g/dL (ref 6.5–8.1)

## 2018-11-04 LAB — CBC
HCT: 40.5 % (ref 39.0–52.0)
Hemoglobin: 12.7 g/dL — ABNORMAL LOW (ref 13.0–17.0)
MCH: 31.7 pg (ref 26.0–34.0)
MCHC: 31.4 g/dL (ref 30.0–36.0)
MCV: 101 fL — ABNORMAL HIGH (ref 80.0–100.0)
Platelets: 201 10*3/uL (ref 150–400)
RBC: 4.01 MIL/uL — ABNORMAL LOW (ref 4.22–5.81)
RDW: 13.9 % (ref 11.5–15.5)
WBC: 6.6 10*3/uL (ref 4.0–10.5)
nRBC: 0 % (ref 0.0–0.2)

## 2018-11-04 LAB — TROPONIN I (HIGH SENSITIVITY)
Troponin I (High Sensitivity): 26 ng/L — ABNORMAL HIGH (ref <18)
Troponin I (High Sensitivity): 29 ng/L — ABNORMAL HIGH (ref <18)

## 2018-11-04 LAB — LIPASE, BLOOD: Lipase: 37 U/L (ref 11–51)

## 2018-11-04 NOTE — ED Notes (Signed)
Taking fluids without difficulty.

## 2018-11-04 NOTE — ED Provider Notes (Signed)
[facility] EMERGENCY DEPARTMENT Provider Note   CSN: [id number] Arrival date & time: 11/04/18  1007    History   Chief Complaint No chief complaint on file.   HPI Jon Anderson is a 68 y.o. male with a PMH of ESRD on MWF dialysis, CAD s/p CABG, hypertension who presents with substernal, pleuritic chest pain that occurred at the end of dialysis earlier this morning.  He denies accompanying shortness of breath, diaphoresis, and nausea.  His pain did not radiate.  He describes the pain as sharp and says that it worsens when he takes a deep breath.  It has improved since he has been in the emergency department.  This has occurred several times in the past usually during dialysis, but it has not been this severe before.  He denies recent fever, cough, and shortness of breath.     Past Medical History:  Diagnosis Date  . Anemia of chronic disease   . CAD (coronary artery disease)    a.  Myoview 4/11: EF 53%, no scar or ischemia   c. MV 2012 Nl perfusion, apical thinning.  No ischemia or scar.  EF 49%, appears greater by visual estimate.;  d.  Dob stress echo 12/13:  Negative Dob stress echo. There is no evidence of ischemia.  The LVF is normal. b. Normal cors 2016.  . Carotid stenosis    a. <61% RICA, >44% LICA by duplex 06/1538  . Chronic chest pain    occ  . ESRD (end stage renal disease) on dialysis ([place])    M-W-F  . GERD (gastroesophageal reflux disease)   . HNP (herniated nucleus pulposus), lumbar   . HTN (hypertension)    echo 3/10: EF 60%, LAE  . Hyperlipidemia   . Nephrolithiasis    "passed them all"  . Peripheral arterial disease ([place])    a. s/p PTCA to L SFA.  Marland Kitchen Pneumonia   . Restless legs   . Sleep apnea    no cpap, needs to reschedule appointment to set up aquiring cpap  . Snores    a. presumed OSA, pt has refused sleep eval in past.  . Type II diabetes mellitus ([place])    no longer on medications, checks blood glucose at home    Patient  Active Problem List   Diagnosis Date Noted  . DM neuropathy with neurologic complication ([place]) 08/67/6195  . GERD (gastroesophageal reflux disease) 02/28/2018  . Nephrolithiasis 02/28/2018  . Sciatic leg pain 02/28/2018  . Snores 02/28/2018  . Orthopnea 02/23/2018  . Elevated troponin 02/23/2018  . HNP (herniated nucleus pulposus), lumbar 07/03/2017  . Chest pain in adult 04/27/2017  . Restless leg syndrome, uncontrolled 04/27/2017  . 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 Anemia of chronic disease 03/17/2017  . ESRD (end stage renal disease) on dialysis ([place]) 03/17/2017  . Chronic chest pain 03/17/2017  . Type II diabetes mellitus ([place]) 03/17/2017  . Acute on chronic diastolic heart failure ([place]) 03/17/2017  . Acute heart failure ([place]) 03/17/2017  . Lumbar radiculopathy 01/16/2017  . Pre-transplant evaluation for kidney transplant 06/15/2016  . Increased frequency of urination 12/17/2015  . Nocturia 12/17/2015  . Chest pain, non-cardiac 10/09/2015  . Acute on chronic renal failure ([place]) 06/16/2014  . Shoulder pain, left 12/15/2013  . Chest pain 12/15/2013  . Claudication ([place]) 12/11/2013  . PVD (peripheral vascular disease) ([place]) 12/11/2013  . Carotid artery disease ([place]) 09/30/2013  . Acute chest pain 11/15/2012  . Bruit 09/15/2010  .  CAD (coronary artery disease) nonobstructive per cath 2012   . DM (diabetes mellitus) ([place]) 07/22/2009  . Hyperlipidemia LDL goal <70 07/22/2009  . Essential hypertension 07/22/2009    Past Surgical History:  Procedure Laterality Date  . ANGIOPLASTY / STENTING FEMORAL Left 12/11/2013   dr berry  . AV FISTULA PLACEMENT Left 03/19/2014   Procedure: CREATION OF ARTERIOVENOUS (AV) FISTULA  LEFT UPPER ARM;  Surgeon: Mal Misty, MD;  Location: [facility];  Service: Vascular;  Laterality: Left;  . CARDIAC CATHETERIZATION  2001 and 2010   . COLONOSCOPY W/ BIOPSIES AND POLYPECTOMY    . COLONOSCOPY WITH PROPOFOL N/A 08/01/2016   Procedure:  COLONOSCOPY WITH PROPOFOL;  Surgeon: Carol Ada, MD;  Location: [facility];  Service: Endoscopy;  Laterality: N/A;  . CORONARY ARTERY BYPASS GRAFT  2018  . ESOPHAGOGASTRODUODENOSCOPY (EGD) WITH PROPOFOL N/A 08/01/2016   Procedure: ESOPHAGOGASTRODUODENOSCOPY (EGD) WITH PROPOFOL;  Surgeon: Carol Ada, MD;  Location: [facility];  Service: Endoscopy;  Laterality: N/A;  . FOOT FRACTURE SURGERY Right    ligament repair  . FRACTURE SURGERY    . INGUINAL HERNIA REPAIR Left   . LEFT HEART CATH AND CORS/GRAFTS ANGIOGRAPHY N/A 04/27/2017   Procedure: LEFT HEART CATH AND CORS/GRAFTS ANGIOGRAPHY;  Surgeon: Leonie Man, MD;  Location: [facility] CV LAB;  Service: Cardiovascular;  Laterality: N/A;  . LEFT HEART CATHETERIZATION WITH CORONARY ANGIOGRAM N/A 06/22/2014   Procedure: LEFT HEART CATHETERIZATION WITH CORONARY ANGIOGRAM;  Surgeon: Troy Sine, MD;  Location: [facility] CATH LAB;  Service: Cardiovascular;  Laterality: N/A;  . LOWER EXTREMITY ANGIOGRAM Left 12/11/2013   Procedure: LOWER EXTREMITY ANGIOGRAM;  Surgeon: Lorretta Harp, MD;  Location: [facility] CATH LAB;  Service: Cardiovascular;  Laterality: Left;  . LUMBAR LAMINECTOMY/DECOMPRESSION MICRODISCECTOMY Right 07/03/2017   Procedure: MICRODISCECTOMY LUMBAR FIVE - SACRAL ONE RIGHT;  Surgeon: Consuella Lose, MD;  Location: [facility];  Service: Neurosurgery;  Laterality: Right;  . LUMBAR LAMINECTOMY/DECOMPRESSION MICRODISCECTOMY Right 10/19/2017   Procedure: MICRODISCECTOMY LUMBAR FIVE- SACRAL 1 ONE ;  Surgeon: Consuella Lose, MD;  Location: [facility];  Service: Neurosurgery;  Laterality: Right;  . TONSILLECTOMY AND ADENOIDECTOMY    . WISDOM TOOTH EXTRACTION          Home Medications    Prior to Admission medications   Medication Sig Start Date End Date Taking? Authorizing Provider  acetaminophen (TYLENOL) 500 MG tablet Take 1,000 mg by mouth every 4 (four) hours as needed for moderate pain or headache.     [provider]  amLODipine  (NORVASC) 5 MG tablet Take 1 tablet (5 mg total) by mouth daily for 30 days. 07/20/18 08/23/18  Elodia Florence., MD  aspirin EC 81 MG tablet Take 1 tablet (81 mg total) by mouth daily. 05/28/12   Richardson Dopp T, PA-C  atorvastatin (LIPITOR) 40 MG tablet Take 1 tablet (40 mg total) by mouth daily at 6 PM. 12/13/13   Brett Canales, PA-C  calcium acetate (PHOSLO) 667 MG capsule Take 3 capsules (2,001 mg total) by mouth 3 (three) times daily with meals. Patient taking differently: Take 1,334 mg by mouth 3 (three) times daily with meals.  10/09/15   Janece Canterbury, MD  clopidogrel (PLAVIX) 75 MG tablet Take 75 mg by mouth daily. 07/29/18   [provider]  clotrimazole (LOTRIMIN) 1 % cream Apply between toes and under toes twice daily for 4 weeks. 09/17/18   Marzetta Board, DPM  cyclobenzaprine (FLEXERIL) 10 MG tablet Take 10 mg by mouth  3 (three) times daily. 07/31/18   [provider]  gabapentin (NEURONTIN) 100 MG capsule Take 100 mg by mouth 2 (two) times daily.     [provider]  HYDROcodone-acetaminophen (NORCO/VICODIN) 5-325 MG tablet Take 1 tablet by mouth every 4 (four) hours as needed. 06/14/18   Tacy Learn, PA-C  multivitamin (RENA-VIT) TABS tablet Take 1 tablet by mouth at bedtime. 06/23/14   Domenic Polite, MD  neomycin-polymyxin b-dexamethasone (MAXITROL) 3.5-10000-0.1 SUSP PLACE 2 DROPS IN THE OS BID 08/21/18   [provider]  nitroGLYCERIN (NITROSTAT) 0.4 MG SL tablet Place 1 tablet (0.4 mg total) under the tongue every 5 (five) minutes as needed for chest pain. 03/20/12   Barrett, Evelene Croon, PA-C  oxyCODONE (OXY IR/ROXICODONE) 5 MG immediate release tablet Take 5 mg by mouth every 6 (six) hours as needed. 08/28/18   [provider]  pantoprazole (PROTONIX) 40 MG tablet Take 40 mg by mouth daily.    [provider]  polyethylene glycol (MIRALAX / GLYCOLAX) packet Take 17 g by mouth daily as needed for moderate constipation.  03/22/17   Aline August, MD  rOPINIRole (REQUIP) 0.5 MG tablet Take 0.5 mg by mouth 2 (two) times daily. 09/27/18   [provider]  tamsulosin (FLOMAX) 0.4 MG CAPS capsule Take 0.4 mg by mouth daily. 05/23/18   [provider]    Family History Family History  Problem Relation Age of Onset  . Heart attack Sister        died @ 41  . Cancer Mother        died @ 23; unknown type  . Diabetes Brother        deceased  . Cirrhosis Father        alcohol related  . Diabetes Father   . Esophageal cancer Neg Hx   . Colon cancer Neg Hx   . Pancreatic cancer Neg Hx   . Stomach cancer Neg Hx     Social History Social History   Tobacco Use  . Smoking status: Former Smoker    Packs/day: 1.00    Years: 2.00    Pack years: 2.00    Types: Cigarettes  . Smokeless tobacco: Never Used  . Tobacco comment: quit smoking 40 yrs ago  Substance Use Topics  . Alcohol use: No    Alcohol/week: 0.0 standard drinks  . Drug use: No     Allergies   Kiwi extract, Flexeril [cyclobenzaprine], and Tape   Review of Systems Review of Systems  Constitutional: Negative for activity change, appetite change, fatigue, fever and unexpected weight change.  HENT: Negative for congestion and rhinorrhea.   Respiratory: Negative for cough, chest tightness and shortness of breath.   Cardiovascular: Positive for chest pain. Negative for palpitations.  Gastrointestinal: Negative for diarrhea and nausea.  Musculoskeletal: Negative for back pain.  Neurological: Negative for weakness and headaches.  Psychiatric/Behavioral: Negative for confusion. The patient is not nervous/anxious.      Physical Exam Updated Vital Signs BP (!) 148/72 (BP Location: Right Arm)   Pulse 60   Temp 97.9 F (36.6 C) (Oral)   Resp 16   Ht 5\' 11"  (1.803 m)   Wt 73 kg   SpO2 99%   BMI 22.45 kg/m   Physical Exam Constitutional:      General: He is not in acute distress.    Appearance: He is normal weight.      Comments: Sleeping when I entered the room  HENT:  Head: Normocephalic and atraumatic.     Right Ear: External ear normal.     Left Ear: External ear normal.     Nose: Nose normal.     Mouth/Throat:     Mouth: Mucous membranes are moist.  Eyes:     Extraocular Movements: Extraocular movements intact.     Conjunctiva/sclera: Conjunctivae normal.     Pupils: Pupils are equal, round, and reactive to light.  Neck:     Musculoskeletal: Normal range of motion and neck supple.  Cardiovascular:     Rate and Rhythm: Normal rate and regular rhythm.     Pulses: Normal pulses.     Heart sounds: Normal heart sounds.  Pulmonary:     Effort: Pulmonary effort is normal. No respiratory distress.     Breath sounds: Normal breath sounds. No rhonchi or rales.  Abdominal:     General: Abdomen is flat. Bowel sounds are normal.     Palpations: Abdomen is soft.     Tenderness: There is abdominal tenderness (epigastric ).  Musculoskeletal: Normal range of motion.     Right lower leg: No edema.     Left lower leg: No edema.  Skin:    General: Skin is warm and dry.  Neurological:     General: No focal deficit present.     Mental Status: He is alert and oriented to person, place, and time.  Psychiatric:        Mood and Affect: Mood normal.        Behavior: Behavior normal.      ED Treatments / Results  Labs (all labs ordered are listed, but only abnormal results are displayed) Labs Reviewed  CBC - Abnormal; Notable for the following components:      Result Value   RBC 4.01 (*)    Hemoglobin 12.7 (*)    MCV 101.0 (*)    All other components within normal limits  COMPREHENSIVE METABOLIC PANEL - Abnormal; Notable for the following components:   Potassium 3.2 (*)    Chloride 94 (*)    Glucose, Bld 132 (*)    Creatinine, Ser 7.48 (*)    Calcium 8.8 (*)    GFR calc non Af Amer 7 (*)    GFR calc Af Amer 8 (*)    Anion gap 16 (*)    All other components within normal limits  TROPONIN I  (HIGH SENSITIVITY) - Abnormal; Notable for the following components:   Troponin I (High Sensitivity) 26 (*)    All other components within normal limits  TROPONIN I (HIGH SENSITIVITY) - Abnormal; Notable for the following components:   Troponin I (High Sensitivity) 29 (*)    All other components within normal limits  LIPASE, BLOOD    EKG EKG Interpretation  Date/Time:  Monday November 04 2018 10:18:52 EDT Ventricular Rate:  71 PR Interval:    QRS Duration: 100 QT Interval:  401 QTC Calculation: 436 R Axis:   34 Text Interpretation:  Sinus rhythm Ventricular premature complex Probable anteroseptal infarct, recent No significant change since last tracing Confirmed by Blanchie Dessert [phone]) on 11/04/2018 11:02:15 AM   Radiology Dg Chest 2 View  Result Date: 11/04/2018 CLINICAL DATA:  Onset of mid to lower chest pain during dialysis today, history hypertension, diabetes mellitus, coronary artery disease EXAM: CHEST - 2 VIEW COMPARISON:  08/23/2018 FINDINGS: Normal heart size, mediastinal contours, and pulmonary vascularity. Atherosclerotic calcification aorta. Lungs clear. No infiltrate, pleural effusion, or pneumothorax. Bones demineralized. IMPRESSION: No acute abnormalities.  Electronically Signed   By: Lavonia Dana M.D.   On: 11/04/2018 11:43    Procedures Procedures (including critical care time)  Medications Ordered in ED Medications - No data to display   Initial Impression / Assessment and Plan / ED Course  I have reviewed the triage vital signs and the nursing notes.  Pertinent labs & imaging results that were available during my care of the patient were reviewed by me and considered in my medical decision making (see chart for details).       Patient's chest pain at the end of the dialysis could be due to temporary ischemia from pulling too much fluid off, especially given patient's history of CAD.  High-sensitivity troponin is 26 today, which appears to be at his  baseline, since his troponin is chronically mildly elevated.  Other labs appear reassuring.  Will give p.o. challenge.  Patient tolerated p.o. challenge well.  Repeat troponin is 29, which is reassuring.  Patient was given return precautions and encouraged to follow-up with his PCP if he continues to have this pain in the next few days.  He was felt to be safe for discharge home.   Final Clinical Impressions(s) / ED Diagnoses   Final diagnoses:  Pleuritic chest pain    ED Discharge Orders    None       Kathrene Alu, MD 11/04/18 1448    Blanchie Dessert, MD 11/04/18 2122

## 2018-11-04 NOTE — Discharge Instructions (Signed)
Your labs and imaging look normal, so your chest pain will likely continue to improve.  Please follow up with your primary doctor if you continue to have this pain.

## 2018-11-04 NOTE — ED Notes (Signed)
Patient verbalizes understanding of discharge instructions. Opportunity for questioning and answers were provided. Armband removed by staff, pt discharged from ED.  

## 2018-11-04 NOTE — ED Triage Notes (Signed)
Pt from dialysis finishsed 2 hours and 19 minutes then starting having RUQ abd pain without radiation into chest.  Some nausea noted, but none now.  States felt wnl prior to dialysis.  Alert and oriented.  Pain was a "7" when it started, 4 now.

## 2018-11-05 ENCOUNTER — Ambulatory Visit (INDEPENDENT_AMBULATORY_CARE_PROVIDER_SITE_OTHER): Payer: Medicare Other | Admitting: Podiatry

## 2018-11-05 ENCOUNTER — Ambulatory Visit: Payer: Medicare Other | Admitting: Orthotics

## 2018-11-05 ENCOUNTER — Encounter: Payer: Self-pay | Admitting: Podiatry

## 2018-11-05 DIAGNOSIS — L97521 Non-pressure chronic ulcer of other part of left foot limited to breakdown of skin: Secondary | ICD-10-CM

## 2018-11-05 DIAGNOSIS — M2040 Other hammer toe(s) (acquired), unspecified foot: Secondary | ICD-10-CM

## 2018-11-05 DIAGNOSIS — L97511 Non-pressure chronic ulcer of other part of right foot limited to breakdown of skin: Secondary | ICD-10-CM | POA: Diagnosis not present

## 2018-11-05 DIAGNOSIS — E08621 Diabetes mellitus due to underlying condition with foot ulcer: Secondary | ICD-10-CM

## 2018-11-05 DIAGNOSIS — E1151 Type 2 diabetes mellitus with diabetic peripheral angiopathy without gangrene: Secondary | ICD-10-CM

## 2018-11-05 NOTE — Progress Notes (Signed)

## 2018-11-05 NOTE — Patient Instructions (Signed)
Diabetes Mellitus and Foot Care Foot care is an important part of your health, especially when you have diabetes. Diabetes may cause you to have problems because of poor blood flow (circulation) to your feet and legs, which can cause your skin to:  Become thinner and drier.  Break more easily.  Heal more slowly.  Peel and crack. You may also have nerve damage (neuropathy) in your legs and feet, causing decreased feeling in them. This means that you may not notice minor injuries to your feet that could lead to more serious problems. Noticing and addressing any potential problems early is the best way to prevent future foot problems. How to care for your feet Foot hygiene  Wash your feet daily with warm water and mild soap. Do not use hot water. Then, pat your feet and the areas between your toes until they are completely dry. Do not soak your feet as this can dry your skin.  Trim your toenails straight across. Do not dig under them or around the cuticle. File the edges of your nails with an emery board or nail file.  Apply a moisturizing lotion or petroleum jelly to the skin on your feet and to dry, brittle toenails. Use lotion that does not contain alcohol and is unscented. Do not apply lotion between your toes. Shoes and socks  Wear clean socks or stockings every day. Make sure they are not too tight. Do not wear knee-high stockings since they may decrease blood flow to your legs.  Wear shoes that fit properly and have enough cushioning. Always look in your shoes before you put them on to be sure there are no objects inside.  To break in new shoes, wear them for just a few hours a day. This prevents injuries on your feet. Wounds, scrapes, corns, and calluses  Check your feet daily for blisters, cuts, bruises, sores, and redness. If you cannot see the bottom of your feet, use a mirror or ask someone for help.  Do not cut corns or calluses or try to remove them with medicine.  If you  find a minor scrape, cut, or break in the skin on your feet, keep it and the skin around it clean and dry. You may clean these areas with mild soap and water. Do not clean the area with peroxide, alcohol, or iodine.  If you have a wound, scrape, corn, or callus on your foot, look at it several times a day to make sure it is healing and not infected. Check for: ? Redness, swelling, or pain. ? Fluid or blood. ? Warmth. ? Pus or a bad smell. General instructions  Do not cross your legs. This may decrease blood flow to your feet.  Do not use heating pads or hot water bottles on your feet. They may burn your skin. If you have lost feeling in your feet or legs, you may not know this is happening until it is too late.  Protect your feet from hot and cold by wearing shoes, such as at the beach or on hot pavement.  Schedule a complete foot exam at least once a year (annually) or more often if you have foot problems. If you have foot problems, report any cuts, sores, or bruises to your health care provider immediately. Contact a health care provider if:  You have a medical condition that increases your risk of infection and you have any cuts, sores, or bruises on your feet.  You have an injury that is not   healing.  You have redness on your legs or feet.  You feel burning or tingling in your legs or feet.  You have pain or cramps in your legs and feet.  Your legs or feet are numb.  Your feet always feel cold.  You have pain around a toenail. Get help right away if:  You have a wound, scrape, corn, or callus on your foot and: ? You have pain, swelling, or redness that gets worse. ? You have fluid or blood coming from the wound, scrape, corn, or callus. ? Your wound, scrape, corn, or callus feels warm to the touch. ? You have pus or a bad smell coming from the wound, scrape, corn, or callus. ? You have a fever. ? You have a red line going up your leg. Summary  Check your feet every day  for cuts, sores, red spots, swelling, and blisters.  Moisturize feet and legs daily.  Wear shoes that fit properly and have enough cushioning.  If you have foot problems, report any cuts, sores, or bruises to your health care provider immediately.  Schedule a complete foot exam at least once a year (annually) or more often if you have foot problems. This information is not intended to replace advice given to you by your health care provider. Make sure you discuss any questions you have with your health care provider. Document Released: 03/24/2000 Document Revised: 05/09/2017 Document Reviewed: 04/28/2016 Elsevier Patient Education  2020 [employer] Inc.  

## 2018-11-06 NOTE — Progress Notes (Signed)
Subjective:  [name] presents for continued care of ulceration of bilateral feet.  He relates area is tender.  Patient denies any fever, chills, nightsweats, nausea or vomiting.   Current Outpatient Medications:  .  acetaminophen (TYLENOL) 500 MG tablet, Take 1,000 mg by mouth every 4 (four) hours as needed for moderate pain or headache. , Disp: , Rfl:  .  amLODipine (NORVASC) 5 MG tablet, Take 1 tablet (5 mg total) by mouth daily for 30 days., Disp: 30 tablet, Rfl: 0 .  aspirin EC 81 MG tablet, Take 1 tablet (81 mg total) by mouth daily., Disp: , Rfl:  .  atorvastatin (LIPITOR) 40 MG tablet, Take 1 tablet (40 mg total) by mouth daily at 6 PM., Disp: 30 tablet, Rfl: 5 .  calcium acetate (PHOSLO) 667 MG capsule, Take 3 capsules (2,001 mg total) by mouth 3 (three) times daily with meals. (Patient taking differently: Take 1,334 mg by mouth 3 (three) times daily with meals. ), Disp: 270 capsule, Rfl: 0 .  clopidogrel (PLAVIX) 75 MG tablet, Take 75 mg by mouth daily., Disp: , Rfl:  .  clotrimazole (LOTRIMIN) 1 % cream, Apply between toes and under toes twice daily for 4 weeks., Disp: 30 g, Rfl: 1 .  cyclobenzaprine (FLEXERIL) 10 MG tablet, Take 10 mg by mouth 3 (three) times daily., Disp: , Rfl:  .  gabapentin (NEURONTIN) 100 MG capsule, Take 100 mg by mouth 2 (two) times daily. , Disp: , Rfl:  .  HYDROcodone-acetaminophen (NORCO/VICODIN) 5-325 MG tablet, Take 1 tablet by mouth every 4 (four) hours as needed., Disp: 10 tablet, Rfl: 0 .  multivitamin (RENA-VIT) TABS tablet, Take 1 tablet by mouth at bedtime., Disp: 30 tablet, Rfl: 0 .  neomycin-polymyxin b-dexamethasone (MAXITROL) 3.5-10000-0.1 SUSP, PLACE 2 DROPS IN THE OS BID, Disp: , Rfl:  .  nitroGLYCERIN (NITROSTAT) 0.4 MG SL tablet, Place 1 tablet (0.4 mg total) under the tongue every 5 (five) minutes as needed for chest pain., Disp: 25 tablet, Rfl: 3 .  oxyCODONE (OXY IR/ROXICODONE) 5 MG immediate release tablet, Take 5 mg by mouth  every 6 (six) hours as needed., Disp: , Rfl:  .  pantoprazole (PROTONIX) 40 MG tablet, Take 40 mg by mouth daily., Disp: , Rfl:  .  polyethylene glycol (MIRALAX / GLYCOLAX) packet, Take 17 g by mouth daily as needed for moderate constipation., Disp: 14 each, Rfl: 0 .  rOPINIRole (REQUIP) 0.5 MG tablet, Take 0.5 mg by mouth 2 (two) times daily., Disp: , Rfl:  .  tamsulosin (FLOMAX) 0.4 MG CAPS capsule, Take 0.4 mg by mouth daily., Disp: , Rfl:    Allergies  Allergen Reactions  . Kiwi Extract Itching, Swelling and Other (See Comments)    Lips and face swell- breathing not affected  . Flexeril [Cyclobenzaprine]     Hands become flimsy, can not hold things  . Tape Other (See Comments)    "Plastic" tape causes blisters!!     Objective:   Vascular Examination:  Capillary refill time <3 seconds x 10 digits.  Dorsalis pedis pulses absent b/l.  Posterior tibial pulses absent b/l.  Digital hair absent x 10 digits.  Skin temperature gradient WNL  b/l  Dermatological Examination: Skin thin, shiny and atrophic b/l.  Toenails 1-5 b/l recently debrided.  Ulceration located submet head 5 b/l: Predebridement measurements carried out today of 1.0 x 1.0 cm.  No periulcerative erythema, no edema, no drainage.  Flocculence is absent.  Malodor absent.  Postdebridement measurements  today are: 1.0 x 1.0 x 0.1 cm.  No tracking, no tunneling, no undermining. No probing to bone, no purulent drainage.  No deep abscess evident.  Musculoskeletal: Muscle strength 5/5 to all LE muscle groups  Neurological: Sensation diminished b/l with 10 gram monofilament.  Vibratory sensation diminished b/l.  Assessment:   1.  Diabetic Ulceration bilateral 2.    NIDDM with peripheral neuropathy  Plan: 1. Ulcer was debrided and reactive hyperkeratoseswas resected to the level of bleeding or viable tissue. Ulcer was cleansed with wound cleanser. Antibiotic ointment was applied to base of wounds with light  dressing. 2. Patient was given instructions on offloading and dressing change/aftercare and was instructed to call immediately if any signs or symptoms of infection arise.  3. Patient is to follow up 6 weeks. 4. Patient instructed to report to emergency department with worsening appearance of ulcer/toe/foot, increased pain, foul odor, increased redness, swelling, drainage, fever, chills, nightsweats, nausea, vomiting, increased blood sugar.  5. Patient/POA related understanding.

## 2018-12-06 ENCOUNTER — Ambulatory Visit (INDEPENDENT_AMBULATORY_CARE_PROVIDER_SITE_OTHER): Payer: Medicare Other | Admitting: Orthotics

## 2018-12-06 ENCOUNTER — Other Ambulatory Visit: Payer: Self-pay

## 2018-12-06 DIAGNOSIS — E1151 Type 2 diabetes mellitus with diabetic peripheral angiopathy without gangrene: Secondary | ICD-10-CM

## 2018-12-06 DIAGNOSIS — L97521 Non-pressure chronic ulcer of other part of left foot limited to breakdown of skin: Secondary | ICD-10-CM | POA: Diagnosis not present

## 2018-12-06 DIAGNOSIS — L97511 Non-pressure chronic ulcer of other part of right foot limited to breakdown of skin: Secondary | ICD-10-CM

## 2018-12-06 DIAGNOSIS — E08621 Diabetes mellitus due to underlying condition with foot ulcer: Secondary | ICD-10-CM | POA: Diagnosis not present

## 2018-12-06 DIAGNOSIS — M2040 Other hammer toe(s) (acquired), unspecified foot: Secondary | ICD-10-CM | POA: Diagnosis not present

## 2018-12-10 ENCOUNTER — Ambulatory Visit (INDEPENDENT_AMBULATORY_CARE_PROVIDER_SITE_OTHER): Payer: Medicare Other | Admitting: Podiatry

## 2018-12-10 ENCOUNTER — Other Ambulatory Visit: Payer: Self-pay

## 2018-12-10 ENCOUNTER — Encounter: Payer: Self-pay | Admitting: Podiatry

## 2018-12-10 DIAGNOSIS — E08621 Diabetes mellitus due to underlying condition with foot ulcer: Secondary | ICD-10-CM | POA: Diagnosis not present

## 2018-12-10 DIAGNOSIS — L97511 Non-pressure chronic ulcer of other part of right foot limited to breakdown of skin: Secondary | ICD-10-CM | POA: Diagnosis not present

## 2018-12-10 DIAGNOSIS — E1142 Type 2 diabetes mellitus with diabetic polyneuropathy: Secondary | ICD-10-CM

## 2018-12-10 NOTE — Patient Instructions (Signed)
Diabetes Mellitus and Foot Care Foot care is an important part of your health, especially when you have diabetes. Diabetes may cause you to have problems because of poor blood flow (circulation) to your feet and legs, which can cause your skin to:  Become thinner and drier.  Break more easily.  Heal more slowly.  Peel and crack. You may also have nerve damage (neuropathy) in your legs and feet, causing decreased feeling in them. This means that you may not notice minor injuries to your feet that could lead to more serious problems. Noticing and addressing any potential problems early is the best way to prevent future foot problems. How to care for your feet Foot hygiene  Wash your feet daily with warm water and mild soap. Do not use hot water. Then, pat your feet and the areas between your toes until they are completely dry. Do not soak your feet as this can dry your skin.  Trim your toenails straight across. Do not dig under them or around the cuticle. File the edges of your nails with an emery board or nail file.  Apply a moisturizing lotion or petroleum jelly to the skin on your feet and to dry, brittle toenails. Use lotion that does not contain alcohol and is unscented. Do not apply lotion between your toes. Shoes and socks  Wear clean socks or stockings every day. Make sure they are not too tight. Do not wear knee-high stockings since they may decrease blood flow to your legs.  Wear shoes that fit properly and have enough cushioning. Always look in your shoes before you put them on to be sure there are no objects inside.  To break in new shoes, wear them for just a few hours a day. This prevents injuries on your feet. Wounds, scrapes, corns, and calluses  Check your feet daily for blisters, cuts, bruises, sores, and redness. If you cannot see the bottom of your feet, use a mirror or ask someone for help.  Do not cut corns or calluses or try to remove them with medicine.  If you  find a minor scrape, cut, or break in the skin on your feet, keep it and the skin around it clean and dry. You may clean these areas with mild soap and water. Do not clean the area with peroxide, alcohol, or iodine.  If you have a wound, scrape, corn, or callus on your foot, look at it several times a day to make sure it is healing and not infected. Check for: ? Redness, swelling, or pain. ? Fluid or blood. ? Warmth. ? Pus or a bad smell. General instructions  Do not cross your legs. This may decrease blood flow to your feet.  Do not use heating pads or hot water bottles on your feet. They may burn your skin. If you have lost feeling in your feet or legs, you may not know this is happening until it is too late.  Protect your feet from hot and cold by wearing shoes, such as at the beach or on hot pavement.  Schedule a complete foot exam at least once a year (annually) or more often if you have foot problems. If you have foot problems, report any cuts, sores, or bruises to your health care provider immediately. Contact a health care provider if:  You have a medical condition that increases your risk of infection and you have any cuts, sores, or bruises on your feet.  You have an injury that is not   healing.  You have redness on your legs or feet.  You feel burning or tingling in your legs or feet.  You have pain or cramps in your legs and feet.  Your legs or feet are numb.  Your feet always feel cold.  You have pain around a toenail. Get help right away if:  You have a wound, scrape, corn, or callus on your foot and: ? You have pain, swelling, or redness that gets worse. ? You have fluid or blood coming from the wound, scrape, corn, or callus. ? Your wound, scrape, corn, or callus feels warm to the touch. ? You have pus or a bad smell coming from the wound, scrape, corn, or callus. ? You have a fever. ? You have a red line going up your leg. Summary  Check your feet every day  for cuts, sores, red spots, swelling, and blisters.  Moisturize feet and legs daily.  Wear shoes that fit properly and have enough cushioning.  If you have foot problems, report any cuts, sores, or bruises to your health care provider immediately.  Schedule a complete foot exam at least once a year (annually) or more often if you have foot problems. This information is not intended to replace advice given to you by your health care provider. Make sure you discuss any questions you have with your health care provider. Document Released: 03/24/2000 Document Revised: 05/09/2017 Document Reviewed: 04/28/2016 Elsevier Patient Education  2020 [employer] Inc.  

## 2018-12-16 NOTE — Progress Notes (Signed)
Subjective: Jon Anderson is seen today for follow up painful, elongated, thickened toenails 1-5 b/l feet that he cannot cut. Pain interferes with daily activities. Aggravating factor includes wearing enclosed shoe gear and relieved with periodic debridement.  Jon Anderson is also seen for chronic preulcerative calluses b/l feet. He states they are tender. He denies any redness, swelling, or drainage.   He states he is awaiting his new diabetic shoes.  Objective:  Neurovascular status unchanged: CFT<3 seconds x 10 digits.  Nonpalpable pedal pulses b/l  Digital hair absent x 10 digits.  Skin temperature gradient WNL b/l.  Sensation diminished b/l with 10 gram monofilament.  Vibratory sensation diminished b/l.  Dermatological Examination: Skin thin, shiny and atrophic b/l. +Fat pad atrophy forefoot.  Toenails 1-5 b/l discolored, thick, dystrophic with subungual debris and pain with palpation to nailbeds due to thickness of nails.  Ulceration located submet head 5 b/l: Predebridement measurements carried out today of 1.0 x 1.0 cm.  No periulcerative erythema, no edema, no drainage.  Flocculence is absent. Malodor absent.  Postdebridement measurements reveal partial thickness ulcerations: 0.3 x 0.2  x 0.1 cm b/l submet head 5.  No tracking, no tunneling, no undermining. No probing to bone, no purulent drainage.  No deep abscess evident.  Musculoskeletal: Muscle strength 5/5 to all LE muscle groups.  No gross bony deformities b/l.  No pain, crepitus or joint limitation noted with ROM.   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 Toenails 1-5 b/l were debrided in length and girth without iatrogenic bleeding. 2. Ulcers were debrided to level of  viable tissue. Ulcers cleansed with wound cleanser. Antibiotic ointment was applied to base of wound with light dressing. 3. Patient was given  instructions on offloading and dressing change/aftercare and was instructed to call immediately if any signs or symptoms of infection arise. He is to apply Mupirocin Ointment and band-aid to both ulcerations once daily. Keep feet dry.  4. Patient is to follow up 2 weeks. 5. Patient instructed to report to emergency department with worsening appearance of ulcer/toe/foot, increased pain, foul odor, increased redness, swelling, drainage, fever, chills, nightsweats, nausea, vomiting, increased blood sugar.  6. Patient/POA related understanding.

## 2018-12-19 NOTE — Progress Notes (Signed)

## 2018-12-24 ENCOUNTER — Ambulatory Visit (INDEPENDENT_AMBULATORY_CARE_PROVIDER_SITE_OTHER): Payer: Medicare Other | Admitting: Podiatry

## 2018-12-24 ENCOUNTER — Other Ambulatory Visit: Payer: Self-pay

## 2018-12-24 ENCOUNTER — Encounter: Payer: Self-pay | Admitting: Podiatry

## 2018-12-24 DIAGNOSIS — E08621 Diabetes mellitus due to underlying condition with foot ulcer: Secondary | ICD-10-CM

## 2018-12-24 DIAGNOSIS — L97511 Non-pressure chronic ulcer of other part of right foot limited to breakdown of skin: Secondary | ICD-10-CM | POA: Diagnosis not present

## 2018-12-24 DIAGNOSIS — E1142 Type 2 diabetes mellitus with diabetic polyneuropathy: Secondary | ICD-10-CM

## 2018-12-24 NOTE — Progress Notes (Signed)
Subjective:   [name] presents for continued care of ulceration of bilateral feet.  Patient has been applying Mupirocin Ointment and band-aid daily. Pt. denies any new complaints.  Patient denies any fever, chills, nightsweats, nausea or vomiting.  He states he is waiting for his new diabetic shoes to arrive. He had to send the original pair back due to sizing issue.   Current Outpatient Medications:  .  heparin 1000 UNIT/ML injection, Heparin Sodium (Porcine) 1,000 Units/mL Systemic, Disp: , Rfl:  .  mirabegron ER (MYRBETRIQ) 25 MG TB24 tablet, Take by mouth., Disp: , Rfl:  .  oxyCODONE-acetaminophen (PERCOCET) 7.5-325 MG tablet, Take by mouth., Disp: , Rfl:  .  acetaminophen (TYLENOL) 500 MG tablet, Take 1,000 mg by mouth every 4 (four) hours as needed for moderate pain or headache. , Disp: , Rfl:  .  amLODipine (NORVASC) 5 MG tablet, Take 1 tablet (5 mg total) by mouth daily for 30 days., Disp: 30 tablet, Rfl: 0 .  aspirin EC 81 MG tablet, Take 1 tablet (81 mg total) by mouth daily., Disp: , Rfl:  .  atorvastatin (LIPITOR) 40 MG tablet, Take 1 tablet (40 mg total) by mouth daily at 6 PM., Disp: 30 tablet, Rfl: 5 .  calcium acetate (PHOSLO) 667 MG capsule, Take 3 capsules (2,001 mg total) by mouth 3 (three) times daily with meals. (Patient taking differently: Take 1,334 mg by mouth 3 (three) times daily with meals. ), Disp: 270 capsule, Rfl: 0 .  clopidogrel (PLAVIX) 75 MG tablet, Take 75 mg by mouth daily., Disp: , Rfl:  .  clotrimazole (LOTRIMIN) 1 % cream, Apply between toes and under toes twice daily for 4 weeks., Disp: 30 g, Rfl: 1 .  cyclobenzaprine (FLEXERIL) 10 MG tablet, Take 10 mg by mouth 3 (three) times daily., Disp: , Rfl:  .  gabapentin (NEURONTIN) 100 MG capsule, Take 100 mg by mouth 2 (two) times daily. , Disp: , Rfl:  .  HYDROcodone-acetaminophen (NORCO/VICODIN) 5-325 MG tablet, Take 1 tablet by mouth every 4 (four) hours as needed., Disp: 10 tablet, Rfl: 0 .   multivitamin (RENA-VIT) TABS tablet, Take 1 tablet by mouth at bedtime., Disp: 30 tablet, Rfl: 0 .  neomycin-polymyxin b-dexamethasone (MAXITROL) 3.5-10000-0.1 SUSP, PLACE 2 DROPS IN THE OS BID, Disp: , Rfl:  .  nitroGLYCERIN (NITROSTAT) 0.4 MG SL tablet, Place 1 tablet (0.4 mg total) under the tongue every 5 (five) minutes as needed for chest pain., Disp: 25 tablet, Rfl: 3 .  oxyCODONE (OXY IR/ROXICODONE) 5 MG immediate release tablet, Take 5 mg by mouth every 6 (six) hours as needed., Disp: , Rfl:  .  pantoprazole (PROTONIX) 40 MG tablet, Take 40 mg by mouth daily., Disp: , Rfl:  .  polyethylene glycol (MIRALAX / GLYCOLAX) packet, Take 17 g by mouth daily as needed for moderate constipation., Disp: 14 each, Rfl: 0 .  rOPINIRole (REQUIP) 0.5 MG tablet, Take 0.5 mg by mouth 2 (two) times daily., Disp: , Rfl:  .  sulfamethoxazole-trimethoprim (BACTRIM DS) 800-160 MG tablet, Take 1 tablet by mouth daily., Disp: , Rfl:  .  tamsulosin (FLOMAX) 0.4 MG CAPS capsule, Take 0.4 mg by mouth daily., Disp: , Rfl:    Allergies  Allergen Reactions  . Kiwi Extract Itching, Swelling and Other (See Comments)    Lips and face swell- breathing not affected  . Flexeril [Cyclobenzaprine]     Hands become flimsy, can not hold things  . Tape Other (See Comments)    "  Plastic" tape causes blisters!!     Objective:   Vascular Examination: Capillary refill time <3 seconds x 10 digits.  Dorsalis pedis pulses nonpalpable b/l.  Posterior tibial pulses nonpalpable b/l.  Digital hair absent x 10 digits.  Skin temperature gradient WNL b/l.  Dermatological Examination: Skin thin, shiny and atrophic b/l.  Toenails 1-5 b/l recently debrided.  Ulcerations located submet head 5 b/l: Predebridement measurements carried out today of 1.0x 1.0 cm.  Visible subdermal hemorrhage. No periulcerative erythema, no edema, no drainage.  Flocculence is absent.  Malodor absent.  Postdebridement measurements today are: 1.0 x 1.0  cm with subdermal hemorrhage.  No tracking, no tunneling, no undermining. No probing to bone, no purulent drainage.  No deep abscess evident.  Musculoskeletal: Muscle strength 5/5 to all LE muscle groups.   Neurological: Sensation absent b/l with 10 gram monofilament.  Vibratory sensation absent b/l.  Assessment:   1.  Partial thickness Ulceration submet head 5 bilateral 2.    NIDDM with peripheral neuropathy  Plan: 1. Ulcer was debrided of reactive hyperkeratoses. Ulcer was cleansed with wound cleanser. Triple antibiotic ointment and band-aid applied to each ulcer. Continue Mupirocin Ointment and band-aid daily. 2. He is awaiting new pair of diabetic shoes. 3. [name] is to call immediately if any signs or symptoms of infection arise.  4. Patient is to follow up 3 weeks. 5. Patient instructed to report to emergency department with worsening appearance of ulcer/toe/foot, increased pain, foul odor, increased redness, swelling, drainage, fever, chills, nightsweats, nausea, vomiting, increased blood sugar.  6. Patient/POA related understanding.

## 2018-12-24 NOTE — Patient Instructions (Signed)
Diabetes Mellitus and Foot Care Foot care is an important part of your health, especially when you have diabetes. Diabetes may cause you to have problems because of poor blood flow (circulation) to your feet and legs, which can cause your skin to:  Become thinner and drier.  Break more easily.  Heal more slowly.  Peel and crack. You may also have nerve damage (neuropathy) in your legs and feet, causing decreased feeling in them. This means that you may not notice minor injuries to your feet that could lead to more serious problems. Noticing and addressing any potential problems early is the best way to prevent future foot problems. How to care for your feet Foot hygiene  Wash your feet daily with warm water and mild soap. Do not use hot water. Then, pat your feet and the areas between your toes until they are completely dry. Do not soak your feet as this can dry your skin.  Trim your toenails straight across. Do not dig under them or around the cuticle. File the edges of your nails with an emery board or nail file.  Apply a moisturizing lotion or petroleum jelly to the skin on your feet and to dry, brittle toenails. Use lotion that does not contain alcohol and is unscented. Do not apply lotion between your toes. Shoes and socks  Wear clean socks or stockings every day. Make sure they are not too tight. Do not wear knee-high stockings since they may decrease blood flow to your legs.  Wear shoes that fit properly and have enough cushioning. Always look in your shoes before you put them on to be sure there are no objects inside.  To break in new shoes, wear them for just a few hours a day. This prevents injuries on your feet. Wounds, scrapes, corns, and calluses  Check your feet daily for blisters, cuts, bruises, sores, and redness. If you cannot see the bottom of your feet, use a mirror or ask someone for help.  Do not cut corns or calluses or try to remove them with medicine.  If you  find a minor scrape, cut, or break in the skin on your feet, keep it and the skin around it clean and dry. You may clean these areas with mild soap and water. Do not clean the area with peroxide, alcohol, or iodine.  If you have a wound, scrape, corn, or callus on your foot, look at it several times a day to make sure it is healing and not infected. Check for: ? Redness, swelling, or pain. ? Fluid or blood. ? Warmth. ? Pus or a bad smell. General instructions  Do not cross your legs. This may decrease blood flow to your feet.  Do not use heating pads or hot water bottles on your feet. They may burn your skin. If you have lost feeling in your feet or legs, you may not know this is happening until it is too late.  Protect your feet from hot and cold by wearing shoes, such as at the beach or on hot pavement.  Schedule a complete foot exam at least once a year (annually) or more often if you have foot problems. If you have foot problems, report any cuts, sores, or bruises to your health care provider immediately. Contact a health care provider if:  You have a medical condition that increases your risk of infection and you have any cuts, sores, or bruises on your feet.  You have an injury that is not   healing.  You have redness on your legs or feet.  You feel burning or tingling in your legs or feet.  You have pain or cramps in your legs and feet.  Your legs or feet are numb.  Your feet always feel cold.  You have pain around a toenail. Get help right away if:  You have a wound, scrape, corn, or callus on your foot and: ? You have pain, swelling, or redness that gets worse. ? You have fluid or blood coming from the wound, scrape, corn, or callus. ? Your wound, scrape, corn, or callus feels warm to the touch. ? You have pus or a bad smell coming from the wound, scrape, corn, or callus. ? You have a fever. ? You have a red line going up your leg. Summary  Check your feet every day  for cuts, sores, red spots, swelling, and blisters.  Moisturize feet and legs daily.  Wear shoes that fit properly and have enough cushioning.  If you have foot problems, report any cuts, sores, or bruises to your health care provider immediately.  Schedule a complete foot exam at least once a year (annually) or more often if you have foot problems. This information is not intended to replace advice given to you by your health care provider. Make sure you discuss any questions you have with your health care provider. Document Released: 03/24/2000 Document Revised: 05/09/2017 Document Reviewed: 04/28/2016 Elsevier Patient Education  2020 [employer] Inc.  

## 2018-12-31 ENCOUNTER — Other Ambulatory Visit: Payer: Self-pay

## 2018-12-31 ENCOUNTER — Ambulatory Visit: Payer: Medicare Other | Admitting: Orthotics

## 2018-12-31 DIAGNOSIS — L97511 Non-pressure chronic ulcer of other part of right foot limited to breakdown of skin: Secondary | ICD-10-CM

## 2018-12-31 DIAGNOSIS — E08621 Diabetes mellitus due to underlying condition with foot ulcer: Secondary | ICD-10-CM

## 2018-12-31 DIAGNOSIS — E1142 Type 2 diabetes mellitus with diabetic polyneuropathy: Secondary | ICD-10-CM

## 2018-12-31 DIAGNOSIS — M2040 Other hammer toe(s) (acquired), unspecified foot: Secondary | ICD-10-CM

## 2018-12-31 NOTE — Progress Notes (Signed)
Patient picked up reordered shoes.

## 2019-01-14 ENCOUNTER — Ambulatory Visit: Payer: Medicare Other | Admitting: Podiatry

## 2019-01-22 ENCOUNTER — Telehealth: Payer: Self-pay | Admitting: *Deleted

## 2019-01-22 NOTE — Telephone Encounter (Signed)
A message was left, re: his follow up visit. 

## 2019-01-27 DIAGNOSIS — T7840XA Allergy, unspecified, initial encounter: Secondary | ICD-10-CM | POA: Insufficient documentation

## 2019-04-11 DIAGNOSIS — Z992 Dependence on renal dialysis: Secondary | ICD-10-CM | POA: Insufficient documentation

## 2019-04-11 DIAGNOSIS — Z7982 Long term (current) use of aspirin: Secondary | ICD-10-CM | POA: Insufficient documentation

## 2019-04-11 DIAGNOSIS — Z951 Presence of aortocoronary bypass graft: Secondary | ICD-10-CM | POA: Insufficient documentation

## 2019-04-11 DIAGNOSIS — K5909 Other constipation: Secondary | ICD-10-CM | POA: Insufficient documentation

## 2019-04-15 ENCOUNTER — Ambulatory Visit (HOSPITAL_COMMUNITY)
Admission: RE | Admit: 2019-04-15 | Discharge: 2019-04-15 | Disposition: A | Discharge status: Home / Self Care | Payer: Medicare Other | Source: Ambulatory Visit | Attending: Cardiology | Admitting: Cardiology

## 2019-04-15 ENCOUNTER — Inpatient Hospital Stay (HOSPITAL_COMMUNITY): Admission: RE | Admit: 2019-04-15 | Payer: Medicare Other | Source: Ambulatory Visit

## 2019-04-15 ENCOUNTER — Other Ambulatory Visit: Payer: Self-pay

## 2019-04-15 ENCOUNTER — Other Ambulatory Visit (HOSPITAL_COMMUNITY): Payer: Self-pay | Admitting: Cardiovascular Disease

## 2019-04-15 DIAGNOSIS — I739 Peripheral vascular disease, unspecified: Secondary | ICD-10-CM | POA: Diagnosis not present

## 2019-04-22 ENCOUNTER — Ambulatory Visit (INDEPENDENT_AMBULATORY_CARE_PROVIDER_SITE_OTHER): Payer: Medicare Other | Admitting: Podiatry

## 2019-04-22 ENCOUNTER — Other Ambulatory Visit: Payer: Self-pay

## 2019-04-22 ENCOUNTER — Ambulatory Visit (INDEPENDENT_AMBULATORY_CARE_PROVIDER_SITE_OTHER): Payer: Medicare Other

## 2019-04-22 ENCOUNTER — Encounter: Payer: Self-pay | Admitting: Podiatry

## 2019-04-22 DIAGNOSIS — L97511 Non-pressure chronic ulcer of other part of right foot limited to breakdown of skin: Secondary | ICD-10-CM | POA: Diagnosis not present

## 2019-04-22 DIAGNOSIS — E08621 Diabetes mellitus due to underlying condition with foot ulcer: Secondary | ICD-10-CM

## 2019-04-22 MED ORDER — MUPIROCIN 2 % EX OINT: TOPICAL_OINTMENT | CUTANEOUS | 1 refills | Status: DC

## 2019-04-22 MED ORDER — CEPHALEXIN 500 MG PO CAPS: 500.0000 mg | ORAL_CAPSULE | Freq: Two times a day (BID) | ORAL | 0 refills | Status: AC

## 2019-04-22 NOTE — Progress Notes (Signed)
Subjective:   [name] presents today with cc of painful right foot. Patient states foot has been bothering him for a couple of months.  Patient denies fever, chills, nightsweats, nausea or vomiting.  He has h/o ulceration submet head 5 b/l. Pt states right foot is more painful than left.   Current Outpatient Medications on File Prior to Visit  Medication Sig Dispense Refill  . acetaminophen (TYLENOL) 500 MG tablet Take 1,000 mg by mouth every 4 (four) hours as needed for moderate pain or headache.     Marland Kitchen amLODipine (NORVASC) 5 MG tablet Take 1 tablet (5 mg total) by mouth daily for 30 days. 30 tablet 0  . aspirin EC 81 MG tablet Take 1 tablet (81 mg total) by mouth daily.    Marland Kitchen atorvastatin (LIPITOR) 40 MG tablet Take 1 tablet (40 mg total) by mouth daily at 6 PM. 30 tablet 5  . calcium acetate (PHOSLO) 667 MG capsule Take 3 capsules (2,001 mg total) by mouth 3 (three) times daily with meals. (Patient taking differently: Take 1,334 mg by mouth 3 (three) times daily with meals. ) 270 capsule 0  . clopidogrel (PLAVIX) 75 MG tablet Take 75 mg by mouth daily.    . clotrimazole (LOTRIMIN) 1 % cream Apply between toes and under toes twice daily for 4 weeks. 30 g 1  . cyclobenzaprine (FLEXERIL) 10 MG tablet Take 10 mg by mouth 3 (three) times daily.    Marland Kitchen gabapentin (NEURONTIN) 100 MG capsule Take 100 mg by mouth 2 (two) times daily.     Marland Kitchen HYDROcodone-acetaminophen (NORCO/VICODIN) 5-325 MG tablet Take 1 tablet by mouth every 4 (four) hours as needed. 10 tablet 0  . mirabegron ER (MYRBETRIQ) 25 MG TB24 tablet Take by mouth.    . multivitamin (RENA-VIT) TABS tablet Take 1 tablet by mouth at bedtime. 30 tablet 0  . neomycin-polymyxin b-dexamethasone (MAXITROL) 3.5-10000-0.1 SUSP PLACE 2 DROPS IN THE OS BID    . nitroGLYCERIN (NITROSTAT) 0.4 MG SL tablet Place 1 tablet (0.4 mg total) under the tongue every 5 (five) minutes as needed for chest pain. 25 tablet 3  . oxyCODONE (OXY IR/ROXICODONE) 5  MG immediate release tablet Take 5 mg by mouth every 6 (six) hours as needed.    Marland Kitchen oxyCODONE-acetaminophen (PERCOCET) 7.5-325 MG tablet Take by mouth.    . pantoprazole (PROTONIX) 40 MG tablet Take 40 mg by mouth daily.    . polyethylene glycol (MIRALAX / GLYCOLAX) packet Take 17 g by mouth daily as needed for moderate constipation. 14 each 0  . rOPINIRole (REQUIP) 0.5 MG tablet Take 0.5 mg by mouth 2 (two) times daily.    Marland Kitchen sulfamethoxazole-trimethoprim (BACTRIM DS) 800-160 MG tablet Take 1 tablet by mouth daily.    . tamsulosin (FLOMAX) 0.4 MG CAPS capsule Take 0.4 mg by mouth daily.     No current facility-administered medications on file prior to visit.     Allergies  Allergen Reactions  . Kiwi Extract Itching, Swelling and Other (See Comments)    Lips and face swell- breathing not affected  . Flexeril [Cyclobenzaprine]     Hands become flimsy, can not hold things  . Other Other (See Comments)  . Tape Other (See Comments)    "Plastic" tape causes blisters!!    Objective:   Vascular Examination:  Capillary refill time to digits <3 seconds.  Dorsalis pedis and posterior tibial pulses nonpalpable b/l.  Digital hair absent b/l.    Skin temperature gradient WNL b/l.  Dermatological Examination: Skin thin, shiny and atrophic b/l  Toenails 1-5 b/l discolored, thick, dystrophic with subungual debris.  Ulceration located submet head 5 right foot: There is pinpoint opening with serous drainage. +Tenderness to palpation. Predebridement measurements carried out today of 2.0 x 1.5 cm.  No periulcerative erythema, no edema, no drainage.  No flocculence, no malodor.Postdebridement measurements today are: 1.5 x 1.5 x 0.1 cm.  No tracking, no tunneling, no undermining. No probing to bone, no purulent drainage.  No deep abscess evident.  Musculoskeletal: Muscle strength 5/5 to all LE muscle groups bilaterally.  Neurological: Sensation diminished with 10 gram monofilament.    Xrays: Calcified vessels noted Fleck fx lateral 5th met head No bone erosion 5th met head No gas in tissues  Assessment:   1.  Diabetic Ulceration right foot 2.  NIDDM with PAD/neuropathy  Plan: 1. Ulcer was gently debrided to the level of healthy, viable tissue. Ulcer was cleansed with wound cleanser. Polysporin Ointment was applied to base of wound with light dressing. 2. Xray performed right foot. 3. Surgical shoe was dispensed for with Peg Assist to offload ulcer. 4. Patient was given instructions on offloading and dressing change/aftercare and was instructed to call immediately if any signs or symptoms of infection arise.  5. Prescription sent to pharmacy for Keflex 500 mg. Take 1 capsule (500 mg total) by mouth twice daily for 10 days.  Prescription written for Mupirocin Ointment. Patient is to apply to right foot ulcer once daily and cover with dressing.  6. Patient is to follow up one week. 7. Patient instructed to report to emergency department with worsening appearance of ulcer/toe/foot, increased pain, foul odor, increased redness, swelling, drainage, fever, chills, nightsweats, nausea, vomiting, increased blood sugar.  8. Patient/POA related understanding.

## 2019-04-22 NOTE — Patient Instructions (Addendum)
DRESSING CHANGES RIGHT FOOT:    1. WEAR SURGICAL SHOE ON RIGHT FOOT DAILY.  2. KEEP RIGHT  FOOT DRY AT ALL TIMES!!!!  3. CLEANSE ULCER WITH SALINE.  4. DAB DRY WITH GAUZE SPONGE.  5. APPLY A LIGHT AMOUNT OF MUPIROCIN TO BASE OF ULCER.  6. APPLY OUTER DRESSING/BAND-AID AS INSTRUCTED.  7. DO NOT WALK BAREFOOT!!!  8.  IF YOU EXPERIENCE ANY FEVER, CHILLS, NIGHTSWEATS, NAUSEA OR VOMITING, ELEVATED OR LOW BLOOD SUGARS, REPORT TO EMERGENCY ROOM.  9. IF YOU EXPERIENCE INCREASED REDNESS, PAIN, SWELLING, DISCOLORATION, ODOR, PUS, DRAINAGE OR WARMTH OF YOUR FOOT, REPORT TO EMERGENCY ROOM.   Diabetes Mellitus and Foot Care Foot care is an important part of your health, especially when you have diabetes. Diabetes may cause you to have problems because of poor blood flow (circulation) to your feet and legs, which can cause your skin to:  Become thinner and drier.  Break more easily.  Heal more slowly.  Peel and crack. You may also have nerve damage (neuropathy) in your legs and feet, causing decreased feeling in them. This means that you may not notice minor injuries to your feet that could lead to more serious problems. Noticing and addressing any potential problems early is the best way to prevent future foot problems. How to care for your feet Foot hygiene  Wash your feet daily with warm water and mild soap. Do not use hot water. Then, pat your feet and the areas between your toes until they are completely dry. Do not soak your feet as this can dry your skin.  Trim your toenails straight across. Do not dig under them or around the cuticle. File the edges of your nails with an emery board or nail file.  Apply a moisturizing lotion or petroleum jelly to the skin on your feet and to dry, brittle toenails. Use lotion that does not contain alcohol and is unscented. Do not apply lotion between your toes. Shoes and socks  Wear clean socks or stockings every day. Make sure they are not too  tight. Do not wear knee-high stockings since they may decrease blood flow to your legs.  Wear shoes that fit properly and have enough cushioning. Always look in your shoes before you put them on to be sure there are no objects inside.  To break in new shoes, wear them for just a few hours a day. This prevents injuries on your feet. Wounds, scrapes, corns, and calluses  Check your feet daily for blisters, cuts, bruises, sores, and redness. If you cannot see the bottom of your feet, use a mirror or ask someone for help.  Do not cut corns or calluses or try to remove them with medicine.  If you find a minor scrape, cut, or break in the skin on your feet, keep it and the skin around it clean and dry. You may clean these areas with mild soap and water. Do not clean the area with peroxide, alcohol, or iodine.  If you have a wound, scrape, corn, or callus on your foot, look at it several times a day to make sure it is healing and not infected. Check for: ? Redness, swelling, or pain. ? Fluid or blood. ? Warmth. ? Pus or a bad smell. General instructions  Do not cross your legs. This may decrease blood flow to your feet.  Do not use heating pads or hot water bottles on your feet. They may burn your skin. If you have lost feeling in your  feet or legs, you may not know this is happening until it is too late.  Protect your feet from hot and cold by wearing shoes, such as at the beach or on hot pavement.  Schedule a complete foot exam at least once a year (annually) or more often if you have foot problems. If you have foot problems, report any cuts, sores, or bruises to your health care provider immediately. Contact a health care provider if:  You have a medical condition that increases your risk of infection and you have any cuts, sores, or bruises on your feet.  You have an injury that is not healing.  You have redness on your legs or feet.  You feel burning or tingling in your legs or  feet.  You have pain or cramps in your legs and feet.  Your legs or feet are numb.  Your feet always feel cold.  You have pain around a toenail. Get help right away if:  You have a wound, scrape, corn, or callus on your foot and: ? You have pain, swelling, or redness that gets worse. ? You have fluid or blood coming from the wound, scrape, corn, or callus. ? Your wound, scrape, corn, or callus feels warm to the touch. ? You have pus or a bad smell coming from the wound, scrape, corn, or callus. ? You have a fever. ? You have a red line going up your leg. Summary  Check your feet every day for cuts, sores, red spots, swelling, and blisters.  Moisturize feet and legs daily.  Wear shoes that fit properly and have enough cushioning.  If you have foot problems, report any cuts, sores, or bruises to your health care provider immediately.  Schedule a complete foot exam at least once a year (annually) or more often if you have foot problems. This information is not intended to replace advice given to you by your health care provider. Make sure you discuss any questions you have with your health care provider. Document Revised: 12/18/2018 Document Reviewed: 04/28/2016 Elsevier Patient Education  [place].

## 2019-04-24 ENCOUNTER — Other Ambulatory Visit: Payer: Self-pay

## 2019-04-24 ENCOUNTER — Emergency Department (HOSPITAL_COMMUNITY): Payer: Medicare Other

## 2019-04-24 ENCOUNTER — Inpatient Hospital Stay (HOSPITAL_COMMUNITY)
Admission: EM | Admit: 2019-04-24 | Discharge: 2019-04-26 | DRG: 391 | Disposition: A | Discharge status: Home / Self Care | Payer: Medicare Other | Attending: Internal Medicine | Admitting: Internal Medicine

## 2019-04-24 ENCOUNTER — Encounter (HOSPITAL_COMMUNITY): Payer: Self-pay

## 2019-04-24 DIAGNOSIS — Z87891 Personal history of nicotine dependence: Secondary | ICD-10-CM

## 2019-04-24 DIAGNOSIS — Z6823 Body mass index (BMI) 23.0-23.9, adult: Secondary | ICD-10-CM

## 2019-04-24 DIAGNOSIS — Z951 Presence of aortocoronary bypass graft: Secondary | ICD-10-CM

## 2019-04-24 DIAGNOSIS — I120 Hypertensive chronic kidney disease with stage 5 chronic kidney disease or end stage renal disease: Secondary | ICD-10-CM | POA: Diagnosis present

## 2019-04-24 DIAGNOSIS — N186 End stage renal disease: Secondary | ICD-10-CM

## 2019-04-24 DIAGNOSIS — Z79899 Other long term (current) drug therapy: Secondary | ICD-10-CM

## 2019-04-24 DIAGNOSIS — R1110 Vomiting, unspecified: Secondary | ICD-10-CM

## 2019-04-24 DIAGNOSIS — D631 Anemia in chronic kidney disease: Secondary | ICD-10-CM | POA: Diagnosis present

## 2019-04-24 DIAGNOSIS — K529 Noninfective gastroenteritis and colitis, unspecified: Secondary | ICD-10-CM | POA: Diagnosis not present

## 2019-04-24 DIAGNOSIS — I10 Essential (primary) hypertension: Secondary | ICD-10-CM

## 2019-04-24 DIAGNOSIS — I493 Ventricular premature depolarization: Secondary | ICD-10-CM | POA: Diagnosis present

## 2019-04-24 DIAGNOSIS — K219 Gastro-esophageal reflux disease without esophagitis: Secondary | ICD-10-CM | POA: Diagnosis present

## 2019-04-24 DIAGNOSIS — R64 Cachexia: Secondary | ICD-10-CM | POA: Diagnosis present

## 2019-04-24 DIAGNOSIS — I2510 Atherosclerotic heart disease of native coronary artery without angina pectoris: Secondary | ICD-10-CM | POA: Diagnosis present

## 2019-04-24 DIAGNOSIS — D3502 Benign neoplasm of left adrenal gland: Secondary | ICD-10-CM | POA: Diagnosis present

## 2019-04-24 DIAGNOSIS — Z95820 Peripheral vascular angioplasty status with implants and grafts: Secondary | ICD-10-CM

## 2019-04-24 DIAGNOSIS — E1140 Type 2 diabetes mellitus with diabetic neuropathy, unspecified: Secondary | ICD-10-CM | POA: Diagnosis present

## 2019-04-24 DIAGNOSIS — M79671 Pain in right foot: Secondary | ICD-10-CM | POA: Diagnosis present

## 2019-04-24 DIAGNOSIS — E1122 Type 2 diabetes mellitus with diabetic chronic kidney disease: Secondary | ICD-10-CM | POA: Diagnosis present

## 2019-04-24 DIAGNOSIS — G2581 Restless legs syndrome: Secondary | ICD-10-CM | POA: Diagnosis present

## 2019-04-24 DIAGNOSIS — R197 Diarrhea, unspecified: Secondary | ICD-10-CM | POA: Diagnosis not present

## 2019-04-24 DIAGNOSIS — Z20822 Contact with and (suspected) exposure to covid-19: Secondary | ICD-10-CM | POA: Diagnosis present

## 2019-04-24 DIAGNOSIS — Z992 Dependence on renal dialysis: Secondary | ICD-10-CM

## 2019-04-24 DIAGNOSIS — N2581 Secondary hyperparathyroidism of renal origin: Secondary | ICD-10-CM | POA: Diagnosis present

## 2019-04-24 DIAGNOSIS — D3501 Benign neoplasm of right adrenal gland: Secondary | ICD-10-CM | POA: Diagnosis present

## 2019-04-24 DIAGNOSIS — Z833 Family history of diabetes mellitus: Secondary | ICD-10-CM

## 2019-04-24 DIAGNOSIS — E785 Hyperlipidemia, unspecified: Secondary | ICD-10-CM | POA: Diagnosis present

## 2019-04-24 DIAGNOSIS — Z888 Allergy status to other drugs, medicaments and biological substances status: Secondary | ICD-10-CM

## 2019-04-24 DIAGNOSIS — I252 Old myocardial infarction: Secondary | ICD-10-CM

## 2019-04-24 DIAGNOSIS — I953 Hypotension of hemodialysis: Secondary | ICD-10-CM | POA: Diagnosis present

## 2019-04-24 DIAGNOSIS — Z87442 Personal history of urinary calculi: Secondary | ICD-10-CM

## 2019-04-24 DIAGNOSIS — Z79891 Long term (current) use of opiate analgesic: Secondary | ICD-10-CM

## 2019-04-24 DIAGNOSIS — Z8249 Family history of ischemic heart disease and other diseases of the circulatory system: Secondary | ICD-10-CM

## 2019-04-24 DIAGNOSIS — Z91048 Other nonmedicinal substance allergy status: Secondary | ICD-10-CM

## 2019-04-24 DIAGNOSIS — Z7982 Long term (current) use of aspirin: Secondary | ICD-10-CM

## 2019-04-24 DIAGNOSIS — Z8379 Family history of other diseases of the digestive system: Secondary | ICD-10-CM

## 2019-04-24 DIAGNOSIS — Z7902 Long term (current) use of antithrombotics/antiplatelets: Secondary | ICD-10-CM

## 2019-04-24 DIAGNOSIS — E1151 Type 2 diabetes mellitus with diabetic peripheral angiopathy without gangrene: Secondary | ICD-10-CM | POA: Diagnosis present

## 2019-04-24 DIAGNOSIS — E860 Dehydration: Secondary | ICD-10-CM | POA: Diagnosis present

## 2019-04-24 LAB — CBC WITH DIFFERENTIAL/PLATELET
Abs Immature Granulocytes: 0 10*3/uL (ref 0.00–0.07)
Basophils Absolute: 0 10*3/uL (ref 0.0–0.1)
Basophils Relative: 0 %
Eosinophils Absolute: 0.1 10*3/uL (ref 0.0–0.5)
Eosinophils Relative: 1 %
HCT: 42.8 % (ref 39.0–52.0)
Hemoglobin: 13.8 g/dL (ref 13.0–17.0)
Lymphocytes Relative: 16 %
Lymphs Abs: 1.5 10*3/uL (ref 0.7–4.0)
MCH: 30.5 pg (ref 26.0–34.0)
MCHC: 32.2 g/dL (ref 30.0–36.0)
MCV: 94.5 fL (ref 80.0–100.0)
Monocytes Absolute: 0.8 10*3/uL (ref 0.1–1.0)
Monocytes Relative: 9 %
Neutro Abs: 6.7 10*3/uL (ref 1.7–7.7)
Neutrophils Relative %: 74 %
Platelets: 237 10*3/uL (ref 150–400)
RBC: 4.53 MIL/uL (ref 4.22–5.81)
RDW: 14 % (ref 11.5–15.5)
WBC: 9.1 10*3/uL (ref 4.0–10.5)
nRBC: 0 % (ref 0.0–0.2)
nRBC: 0 /100 WBC

## 2019-04-24 LAB — COMPREHENSIVE METABOLIC PANEL
ALT: 8 U/L (ref 0–44)
AST: 12 U/L — ABNORMAL LOW (ref 15–41)
Albumin: 3.2 g/dL — ABNORMAL LOW (ref 3.5–5.0)
Alkaline Phosphatase: 81 U/L (ref 38–126)
Anion gap: 24 — ABNORMAL HIGH (ref 5–15)
BUN: 56 mg/dL — ABNORMAL HIGH (ref 8–23)
CO2: 25 mmol/L (ref 22–32)
Calcium: 8.6 mg/dL — ABNORMAL LOW (ref 8.9–10.3)
Chloride: 89 mmol/L — ABNORMAL LOW (ref 98–111)
Creatinine, Ser: 16.77 mg/dL — ABNORMAL HIGH (ref 0.61–1.24)
GFR calc Af Amer: 3 mL/min — ABNORMAL LOW (ref >60)
GFR calc non Af Amer: 3 mL/min — ABNORMAL LOW (ref >60)
Glucose, Bld: 153 mg/dL — ABNORMAL HIGH (ref 70–99)
Potassium: 4.1 mmol/L (ref 3.5–5.1)
Sodium: 138 mmol/L (ref 135–145)
Total Bilirubin: 0.6 mg/dL (ref 0.3–1.2)
Total Protein: 7.7 g/dL (ref 6.5–8.1)

## 2019-04-24 LAB — LACTIC ACID, PLASMA
Lactic Acid, Venous: 2.1 mmol/L (ref 0.5–1.9)
Lactic Acid, Venous: 1.4 mmol/L (ref 0.5–1.9)

## 2019-04-24 LAB — POC SARS CORONAVIRUS 2 AG -  ED: SARS Coronavirus 2 Ag: NEGATIVE

## 2019-04-24 LAB — PHOSPHORUS: Phosphorus: 5.9 mg/dL — ABNORMAL HIGH (ref 2.5–4.6)

## 2019-04-24 LAB — MAGNESIUM: Magnesium: 1.8 mg/dL (ref 1.7–2.4)

## 2019-04-24 MED ORDER — CLOPIDOGREL BISULFATE 75 MG PO TABS
75.0000 mg | ORAL_TABLET | Freq: Every day | ORAL | Status: DC
  Administered 2019-04-25 – 2019-04-26 (×2): 75 mg via ORAL
  Filled 2019-04-24 (×2): qty 1

## 2019-04-24 MED ORDER — MORPHINE SULFATE (PF) 2 MG/ML IV SOLN
2.0000 mg | Freq: Once | INTRAVENOUS | Status: AC
  Administered 2019-04-24: 2 mg via INTRAVENOUS
  Filled 2019-04-24: qty 1

## 2019-04-24 MED ORDER — OXYCODONE HCL 5 MG PO TABS
5.0000 mg | ORAL_TABLET | Freq: Four times a day (QID) | ORAL | Status: DC | PRN
  Administered 2019-04-26: 5 mg via ORAL
  Filled 2019-04-24 (×2): qty 1

## 2019-04-24 MED ORDER — CEPHALEXIN 500 MG PO CAPS
500.0000 mg | ORAL_CAPSULE | Freq: Two times a day (BID) | ORAL | Status: DC
  Administered 2019-04-24: 500 mg via ORAL
  Filled 2019-04-24: qty 1
  Filled 2019-04-24: qty 2

## 2019-04-24 MED ORDER — GABAPENTIN 100 MG PO CAPS
100.0000 mg | ORAL_CAPSULE | Freq: Two times a day (BID) | ORAL | Status: DC
  Administered 2019-04-24 – 2019-04-26 (×3): 100 mg via ORAL
  Filled 2019-04-24 (×4): qty 1

## 2019-04-24 MED ORDER — CLOTRIMAZOLE 1 % EX CREA
TOPICAL_CREAM | Freq: Two times a day (BID) | CUTANEOUS | Status: DC
  Filled 2019-04-24 (×3): qty 15

## 2019-04-24 MED ORDER — PANTOPRAZOLE SODIUM 40 MG PO TBEC
40.0000 mg | DELAYED_RELEASE_TABLET | Freq: Every day | ORAL | Status: DC
  Administered 2019-04-25 – 2019-04-26 (×2): 40 mg via ORAL
  Filled 2019-04-24 (×2): qty 1

## 2019-04-24 MED ORDER — CYCLOBENZAPRINE HCL 10 MG PO TABS
10.0000 mg | ORAL_TABLET | Freq: Three times a day (TID) | ORAL | Status: DC
  Administered 2019-04-24 – 2019-04-26 (×4): 10 mg via ORAL
  Filled 2019-04-24 (×4): qty 1

## 2019-04-24 MED ORDER — ASPIRIN EC 81 MG PO TBEC
81.0000 mg | DELAYED_RELEASE_TABLET | Freq: Every day | ORAL | Status: DC
  Administered 2019-04-25 – 2019-04-26 (×2): 81 mg via ORAL
  Filled 2019-04-24 (×2): qty 1

## 2019-04-24 MED ORDER — MUPIROCIN 2 % EX OINT
TOPICAL_OINTMENT | Freq: Every day | CUTANEOUS | Status: DC
  Filled 2019-04-24: qty 22

## 2019-04-24 MED ORDER — RENA-VITE PO TABS
1.0000 | ORAL_TABLET | Freq: Every day | ORAL | Status: DC
  Administered 2019-04-24 – 2019-04-25 (×2): 1 via ORAL
  Filled 2019-04-24 (×2): qty 1

## 2019-04-24 MED ORDER — LACTATED RINGERS IV BOLUS
500.0000 mL | Freq: Once | INTRAVENOUS | Status: AC
  Administered 2019-04-24: 500 mL via INTRAVENOUS

## 2019-04-24 MED ORDER — ATORVASTATIN CALCIUM 40 MG PO TABS
40.0000 mg | ORAL_TABLET | Freq: Every day | ORAL | Status: DC
  Administered 2019-04-25: 40 mg via ORAL
  Filled 2019-04-24: qty 1

## 2019-04-24 MED ORDER — HEPARIN SODIUM (PORCINE) 5000 UNIT/ML IJ SOLN
5000.0000 [IU] | Freq: Three times a day (TID) | INTRAMUSCULAR | Status: DC
  Administered 2019-04-24 – 2019-04-26 (×5): 5000 [IU] via SUBCUTANEOUS
  Filled 2019-04-24 (×4): qty 1

## 2019-04-24 MED ORDER — IOHEXOL 300 MG/ML  SOLN
100.0000 mL | Freq: Once | INTRAMUSCULAR | Status: AC | PRN
  Administered 2019-04-24: 100 mL via INTRAVENOUS

## 2019-04-24 MED ORDER — ONDANSETRON HCL 4 MG/2ML IJ SOLN
4.0000 mg | Freq: Once | INTRAMUSCULAR | Status: AC
  Administered 2019-04-24: 4 mg via INTRAVENOUS
  Filled 2019-04-24: qty 2

## 2019-04-24 MED ORDER — CALCIUM ACETATE (PHOS BINDER) 667 MG PO CAPS
1334.0000 mg | ORAL_CAPSULE | Freq: Two times a day (BID) | ORAL | Status: DC
  Administered 2019-04-24 – 2019-04-26 (×4): 1334 mg via ORAL
  Filled 2019-04-24 (×5): qty 2

## 2019-04-24 NOTE — ED Triage Notes (Signed)
Pt reports n/v/d since Monday. Pt dialysis MWF, missed wednesdays treatment due to symptoms. Pt alert, resp e.u

## 2019-04-24 NOTE — H&P (Signed)
History and Physical  Jon Anderson ZCH:[id number] DOB: 1950/11/28 DOA: 04/24/2019  Referring physician: ER provider PCP: [name], MD  Outpatient Specialists: Nephrology Patient coming from: Home  Chief Complaint: Diarrhea for 4 days, nausea and vomiting  HPI:  Patient is a 68 year old African-American male with past medical history significant for end-stage renal disease on hemodialysis on Monday, Wednesday and Friday; diabetes mellitus type 2, sleep apnea, peripheral artery disease, hypertension, hyperlipidemia, GERD, coronary artery disease, carotid stenosis and anemia of chronic kidney disease.  Apparently, patient missed his dialysis yesterday.  Patient's problem started about 4 days ago with diarrhea.  Patient reported 3-5 episodes of watery stool.  Patient developed nausea and vomiting 1 to 2 days afterwards, but main problem remained diarrhea.  Patient has continued to have diarrhea stools.  No associated fever or chills, no abdominal pain, no headache, no neck pain, no chest pain, no shortness of breath.  Patient was started on Keflex a day after right foot procedure by the podiatrist, however, the diarrhea preceded the antibiotics use.  Last nausea vomiting was this morning.   ED Course: On presentation to the ER, temperature was 98.2, heart rate of 66 to 110 bpm, respiratory rate of 17, blood pressure 109/77 with O2 sat of 88%.  She is currently being managed supportively.  Pertinent labs: CBC reveals WBC of 8.6, hemoglobin of 12.8, hematocrit of 41.1 with platelet count of 328.  Sodium is 138, potassium of 4.1, chloride of 89, CO2 of 25, BUN of 56 with creatinine of 16.79.  EKG: Independently reviewed.   Imaging: independently reviewed.    Review of Systems:  Negative for fever, visual changes, sore throat, rash, new muscle aches, chest pain, SOB, dysuria, bleeding, n/v/abdominal pain.  Past Medical History:  Diagnosis Date  . Anemia of chronic disease   . CAD  (coronary artery disease)    a.  Myoview 4/11: EF 53%, no scar or ischemia   c. MV 2012 Nl perfusion, apical thinning.  No ischemia or scar.  EF 49%, appears greater by visual estimate.;  d.  Dob stress echo 12/13:  Negative Dob stress echo. There is no evidence of ischemia.  The LVF is normal. b. Normal cors 2016.  . Carotid stenosis    a. <28% RICA, >78% LICA by duplex 09/7670  . Chronic chest pain    occ  . ESRD (end stage renal disease) on dialysis ([place])    M-W-F  . GERD (gastroesophageal reflux disease)   . HNP (herniated nucleus pulposus), lumbar   . HTN (hypertension)    echo 3/10: EF 60%, LAE  . Hyperlipidemia   . Nephrolithiasis    "passed them all"  . Peripheral arterial disease ([place])    a. s/p PTCA to L SFA.  Marland Kitchen Pneumonia   . Restless legs   . Sleep apnea    no cpap, needs to reschedule appointment to set up aquiring cpap  . Snores    a. presumed OSA, pt has refused sleep eval in past.  . Type II diabetes mellitus ([place])    no longer on medications, checks blood glucose at home    Past Surgical History:  Procedure Laterality Date  . ANGIOPLASTY / STENTING FEMORAL Left 12/11/2013   dr berry  . AV FISTULA PLACEMENT Left 03/19/2014   Procedure: CREATION OF ARTERIOVENOUS (AV) FISTULA  LEFT UPPER ARM;  Surgeon: Mal Misty, MD;  Location: [facility];  Service: Vascular;  Laterality: Left;  . CARDIAC CATHETERIZATION  2001 and 2010   .  COLONOSCOPY W/ BIOPSIES AND POLYPECTOMY    . COLONOSCOPY WITH PROPOFOL N/A 08/01/2016   Procedure: COLONOSCOPY WITH PROPOFOL;  Surgeon: Carol Ada, MD;  Location: [facility];  Service: Endoscopy;  Laterality: N/A;  . CORONARY ARTERY BYPASS GRAFT  2018  . ESOPHAGOGASTRODUODENOSCOPY (EGD) WITH PROPOFOL N/A 08/01/2016   Procedure: ESOPHAGOGASTRODUODENOSCOPY (EGD) WITH PROPOFOL;  Surgeon: Carol Ada, MD;  Location: [facility];  Service: Endoscopy;  Laterality: N/A;  . FOOT FRACTURE SURGERY Right    ligament repair  . FRACTURE SURGERY    .  INGUINAL HERNIA REPAIR Left   . LEFT HEART CATH AND CORS/GRAFTS ANGIOGRAPHY N/A 04/27/2017   Procedure: LEFT HEART CATH AND CORS/GRAFTS ANGIOGRAPHY;  Surgeon: Leonie Man, MD;  Location: [facility];  Service: Cardiovascular;  Laterality: N/A;  . LEFT HEART CATHETERIZATION WITH CORONARY ANGIOGRAM N/A 06/22/2014   Procedure: LEFT HEART CATHETERIZATION WITH CORONARY ANGIOGRAM;  Surgeon: Troy Sine, MD;  Location[facility]ATH LAB;  Service: Cardiovascular;  Laterality: N/A;  . LOWER EXTREMITY ANGIOGRAM Left 12/11/2013   Procedure: LOWER EXTREMITY ANGIOGRAM;  Surgeon: Lorretta Harp, MD;  Location: [facility] CATH LAB;  Service: Cardiovascular;  Laterality: Left;  . LUMBAR LAMINECTOMY/DECOMPRESSION MICRODISCECTOMY Right 07/03/2017   Procedure: MICRODISCECTOMY LUMBAR FIVE - SACRAL ONE RIGHT;  Surgeon: Consuella Lose, MD;  Location: [facility];  Service: Neurosurgery;  Laterality: Right;  . LUMBAR LAMINECTOMY/DECOMPRESSION MICRODISCECTOMY Right 10/19/2017   Procedure: MICRODISCECTOMY LUMBAR FIVE- SACRAL 1 ONE ;  Surgeon: Consuella Lose, MD;  Location: [place];  Service: Neurosurgery;  Laterality: Right;  . TONSILLECTOMY AND ADENOIDECTOMY    . WISDOM TOOTH EXTRACTION       reports that he has quit smoking. His smoking use included cigarettes. He has a 2.00 pack-year smoking history. He has never used smokeless tobacco. He reports that he does not drink alcohol or use drugs.  Allergies  Allergen Reactions  . Kiwi Extract Itching, Swelling and Other (See Comments)    Lips and face swell- breathing not affected  . Flexeril [Cyclobenzaprine]     Hands become flimsy, can not hold things  . Other Other (See Comments)  . Tape Other (See Comments)    "Plastic" tape causes blisters!!    Family History  Problem Relation Age of Onset  . Heart attack Sister        died @ 74  . Cancer Mother        died @ 52; unknown type  . Diabetes Brother        deceased  . Cirrhosis Father        alcohol  related  . Diabetes Father   . Esophageal cancer Neg Hx   . Colon cancer Neg Hx   . Pancreatic cancer Neg Hx   . Stomach cancer Neg Hx      Prior to Admission medications   Medication Sig Start Date End Date Taking? Authorizing Provider  acetaminophen (TYLENOL) 500 MG tablet Take 1,000 mg by mouth every 4 (four) hours as needed for moderate pain or headache.    Yes [provider]  aspirin EC 81 MG tablet Take 1 tablet (81 mg total) by mouth daily. 05/28/12  Yes Weaver, Scott T, PA-C  atorvastatin (LIPITOR) 40 MG tablet Take 1 tablet (40 mg total) by mouth daily at 6 PM. 12/13/13  Yes Brett Canales, PA-C  calcium acetate (PHOSLO) 667 MG capsule Take 3 capsules (2,001 mg total) by mouth 3 (three) times daily with meals. Patient taking differently: Take 1,334 mg  by mouth 2 (two) times daily.  10/09/15  Yes Short, Noah Delaine, MD  cephALEXin (KEFLEX) 500 MG capsule Take 1 capsule (500 mg total) by mouth 2 (two) times daily for 10 days. 04/22/19 05/02/19 Yes Marzetta Board, DPM  clopidogrel (PLAVIX) 75 MG tablet Take 75 mg by mouth daily. 07/29/18  Yes [provider]  clotrimazole (LOTRIMIN) 1 % cream Apply between toes and under toes twice daily for 4 weeks. 09/17/18  Yes Marzetta Board, DPM  gabapentin (NEURONTIN) 100 MG capsule Take 100 mg by mouth 2 (two) times daily.    Yes [provider]  loperamide (IMODIUM) 2 MG capsule Take 2 mg by mouth as needed for diarrhea or loose stools.   Yes [provider]  multivitamin (RENA-VIT) TABS tablet Take 1 tablet by mouth at bedtime. 06/23/14  Yes Domenic Polite, MD  mupirocin ointment (BACTROBAN) 2 % Apply to right foot once daily. 04/22/19 04/21/20 Yes Galaway, Stephani Police, DPM  pantoprazole (PROTONIX) 40 MG tablet Take 40 mg by mouth daily.   Yes [provider]  polyethylene glycol (MIRALAX / GLYCOLAX) packet Take 17 g by mouth daily as needed for moderate constipation. 03/22/17  Yes Aline August, MD    amLODipine (NORVASC) 5 MG tablet Take 1 tablet (5 mg total) by mouth daily for 30 days. 07/20/18 08/23/18  Elodia Florence., MD  cyclobenzaprine (FLEXERIL) 10 MG tablet Take 10 mg by mouth 3 (three) times daily. 07/31/18   [provider]  HYDROcodone-acetaminophen (NORCO/VICODIN) 5-325 MG tablet Take 1 tablet by mouth every 4 (four) hours as needed. 06/14/18   Tacy Learn, PA-C  nitroGLYCERIN (NITROSTAT) 0.4 MG SL tablet Place 1 tablet (0.4 mg total) under the tongue every 5 (five) minutes as needed for chest pain. 03/20/12   Barrett, Evelene Croon, PA-C  oxyCODONE (OXY IR/ROXICODONE) 5 MG immediate release tablet Take 5 mg by mouth every 6 (six) hours as needed for moderate pain.  08/28/18   [provider]  tamsulosin (FLOMAX) 0.4 MG CAPS capsule Take 0.4 mg by mouth daily. 05/23/18   [provider]    Physical Exam: Vitals:   04/24/19 1745 04/24/19 2000 04/24/19 2015 04/24/19 2200  BP:  111/70 109/73 109/65  Pulse:      Resp:  (!) 24 13 18   Temp:      TempSrc:      SpO2: 93%       Constitutional:  . Appears calm and comfortable Eyes:  . No pallor. No jaundice.  ENMT:  . external ears, nose appear normal.  Dry buccal mucosa. Neck:  . Neck is supple. No JVD Respiratory:  . CTA bilaterally, no w/r/r.  . Respiratory effort normal. No retractions or accessory muscle use Cardiovascular:  . S1S2 . No LE extremity edema   Abdomen:  . Abdomen is soft and non tender. Organs are difficult to assess. Neurologic:  . Awake and alert. . Moves all limbs.  Wt Readings from Last 3 Encounters:  11/04/18 73 kg  08/23/18 71.2 kg  08/08/18 71.2 kg    I have personally reviewed following labs and imaging studies  Labs on Admission:  CBC: Recent Labs  Lab 04/24/19 1654  WBC 9.1  NEUTROABS 6.7  HGB 13.8  HCT 42.8  MCV 94.5  PLT 671   Basic Metabolic Panel: Recent Labs  Lab 04/24/19 1654  NA 138  K 4.1  CL 89*  CO2 25  GLUCOSE 153*  BUN 56*   CREATININE 16.77*  CALCIUM 8.6*  MG 1.8  PHOS 5.9*   Liver Function Tests: Recent Labs  Lab 04/24/19 1654  AST 12*  ALT 8  ALKPHOS 81  BILITOT 0.6  PROT 7.7  ALBUMIN 3.2*   No results for input(s): LIPASE, AMYLASE in the last 168 hours. No results for input(s): AMMONIA in the last 168 hours. Coagulation Profile: No results for input(s): INR, PROTIME in the last 168 hours. Cardiac Enzymes: No results for input(s): CKTOTAL, CKMB, CKMBINDEX, TROPONINI in the last 168 hours. BNP (last 3 results) No results for input(s): PROBNP in the last 8760 hours. HbA1C: No results for input(s): HGBA1C in the last 72 hours. CBG: No results for input(s): GLUCAP in the last 168 hours. Lipid Profile: No results for input(s): CHOL, HDL, LDLCALC, TRIG, CHOLHDL, LDLDIRECT in the last 72 hours. Thyroid Function Tests: No results for input(s): TSH, T4TOTAL, FREET4, T3FREE, THYROIDAB in the last 72 hours. Anemia Panel: No results for input(s): VITAMINB12, FOLATE, FERRITIN, TIBC, IRON, RETICCTPCT in the last 72 hours. Urine analysis:    Component Value Date/Time   COLORURINE YELLOW 03/18/2017 1721   APPEARANCEUR CLEAR 03/18/2017 1721   LABSPEC 1.007 03/18/2017 1721   PHURINE 9.0 (H) 03/18/2017 1721   GLUCOSEU 150 (A) 03/18/2017 1721   HGBUR NEGATIVE 03/18/2017 1721   BILIRUBINUR NEGATIVE 03/18/2017 1721   KETONESUR NEGATIVE 03/18/2017 1721   PROTEINUR >=300 (A) 03/18/2017 1721   UROBILINOGEN 0.2 06/18/2014 2308   NITRITE NEGATIVE 03/18/2017 1721   LEUKOCYTESUR NEGATIVE 03/18/2017 1721   Sepsis Labs: @LABRCNTIP (procalcitonin:4,lacticidven:4) )No results found for this or any previous visit (from the past 240 hour(s)).    Radiological Exams on Admission: CT ABDOMEN PELVIS W CONTRAST  Result Date: 04/24/2019 CLINICAL DATA:  Acute generalized abdominal pain with neutropenia, missed Wednesday dialysis EXAM: CT ABDOMEN AND PELVIS WITH CONTRAST TECHNIQUE: Multidetector CT imaging of the  abdomen and pelvis was performed using the standard protocol following bolus administration of intravenous contrast. CONTRAST:  100 mL Omnipaque 300 COMPARISON:  CT abdomen pelvis 07/17/2018 FINDINGS: Lower chest: Atelectatic change present the right lung base. Cardiac size within normal limits. No pericardial effusion. Dense calcification of mitral annulus is noted. Calcifications also present on the aortic valve leaflets. Three-vessel coronary artery disease is present. Bilateral mild gynecomastia is noted. Hepatobiliary: Hypoattenuating focus along the anterior segment 4 of the liver, could reflect focal fatty infiltration or a benign process such as liver cyst or ciliated foregut cyst. No worrisome hepatic lesions. Mild gallbladder distention with borderline mural thickening but without visible calcified intraluminal or intraductal gallstones. No biliary ductal dilatation. Pancreas: Unremarkable. No pancreatic ductal dilatation or surrounding inflammatory changes. Spleen: Normal in size without focal abnormality. Adrenals/Urinary Tract: Stable appearance of the bilateral nodular adrenal glands largest measuring up to 0.7 cm in the lateral limb of the right adrenal gland. These have been previously characterized as adrenal adenomas. There is extensive bilateral renal atrophy. No worrisome renal lesions. Mild bilateral symmetric perinephric stranding, a nonspecific finding in the setting of dialysis dependence. No urolithiasis or hydronephrosis. Urinary bladder is largely decompressed at the time of exam and therefore poorly evaluated by CT imaging. Stomach/Bowel: Distal esophagus, stomach and duodenal sweep are unremarkable. No small bowel wall thickening or dilatation. No evidence of obstruction. A normal appendix is visualized. Diffuse pancolonic edematous mural thickening with mucosal hyperemia and faint pericolonic hazy stranding. The colon is fluid-filled. Vascular/Lymphatic: Atherosclerotic plaque within  the normal caliber aorta. No suspicious or enlarged lymph nodes in the included lymphatic chains. Reproductive: Mild  prostatomegaly. Seminal vesicles are unremarkable. Other: No bowel containing hernias. Evidence of prior right inguinal hernia repair. No free fluid or free air in the abdomen or pelvis. Faint pericolonic stranding, as detailed above. Musculoskeletal: Prior L4-S1 posterior spinal fusion and decompression no evidence of acute hardware complication. Chronic endplate changes L3 are similar to comparison exam. No acute or worrisome osseous lesions. Additional degenerative changes noted in the lower thoracic spine, both SI joints and hips. IMPRESSION: 1. Diffuse pancolonic edematous mural thickening with mucosal hyperemia and faint pericolonic hazy stranding, consistent with colitis of infectious or inflammatory etiology. 2. Mild gallbladder distention with borderline mural thickening but without visible calcified intraluminal or intraductal gallstones. If there is concern for acute cholecystitis, recommend further evaluation with right upper quadrant ultrasound. 3. Bilateral renal atrophy and faint perinephric stranding, similar to comparison. 4. Stable bilateral adrenal adenomas. 5. Three-vessel coronary artery disease. 6. Aortic Atherosclerosis (ICD10-I70.0). 7. Prior L4-S1 posterior spinal fusion and decompression. Adjacent segment disease at L3-4 is similar to comparison. 8. Mild prostatomegaly. Electronically Signed   By: Lovena Le M.D.   On: 04/24/2019 20:52   DG Chest Port 1 View  Result Date: 04/24/2019 CLINICAL DATA:  Vomiting EXAM: PORTABLE CHEST 1 VIEW COMPARISON:  11/04/2018 FINDINGS: Heart and mediastinal contours are within normal limits. No focal opacities or effusions. No acute bony abnormality. Aortic atherosclerosis. IMPRESSION: No active disease. Electronically Signed   By: Rolm Baptise M.D.   On: 04/24/2019 18:35    EKG: Independently reviewed.   Active Problems:    Gastroenteritis   Assessment/Plan Acute gastroenteritis: -Admit patient for further assessment and management. -Supportive care for now. -Follow repeat COVID-19 test. -Further management depend on hospital course.  ESRD on hemodialysis Monday Wednesday Friday: -Nephrology team has been consulted.  Hypertension: -Controlled. -Continue to monitor closely.  Next line  Coronary artery disease/MI: -Patient has remained relatively stable.   -No chest pain reported  DVT prophylaxis: Heparin Code Status: Full code Family Communication:  Disposition Plan: Home eventually placement Consults called: Nephrology team has been consulted Admission status: Observation  Time spent: 65 minutes  Dana Allan, MD  [facility] Pager #: [phone] 7PM-7AM contact night coverage as above  04/24/2019, 11:10 PM

## 2019-04-24 NOTE — ED Provider Notes (Signed)
[facility] EMERGENCY DEPARTMENT Provider Note   CSN: [id number] Arrival date & time: 04/24/19  1630     History Chief Complaint  Patient presents with  . Emesis  . Nausea  . Diarrhea    Jon Anderson is a 69 y.o. male.  HPI 69 year old male with history of CAD, dialysis on Monday Wednesday Friday schedule, presented to the emergency department for 2-day history of nausea vomiting, as well as diarrhea.  No fevers at home, no chest pain, no chills, denies any recent sick contacts or recent travel, states that he has had 2 episodes of diarrhea 3-4 episodes of nonbloody emesis.  States that he is unable to keep anything down over the past 2 days. Missed dialysis yesterday secondary to feeling ill.     Past Medical History:  Diagnosis Date  . Anemia of chronic disease   . CAD (coronary artery disease)    a.  Myoview 4/11: EF 53%, no scar or ischemia   c. MV 2012 Nl perfusion, apical thinning.  No ischemia or scar.  EF 49%, appears greater by visual estimate.;  d.  Dob stress echo 12/13:  Negative Dob stress echo. There is no evidence of ischemia.  The LVF is normal. b. Normal cors 2016.  . Carotid stenosis    a. <14% RICA, >97% LICA by duplex 0/2637  . Chronic chest pain    occ  . ESRD (end stage renal disease) on dialysis ([place])    M-W-F  . GERD (gastroesophageal reflux disease)   . HNP (herniated nucleus pulposus), lumbar   . HTN (hypertension)    echo 3/10: EF 60%, LAE  . Hyperlipidemia   . Nephrolithiasis    "passed them all"  . Peripheral arterial disease ([place])    a. s/p PTCA to L SFA.  Marland Kitchen Pneumonia   . Restless legs   . Sleep apnea    no cpap, needs to reschedule appointment to set up aquiring cpap  . Snores    a. presumed OSA, pt has refused sleep eval in past.  . Type II diabetes mellitus ([place])    no longer on medications, checks blood glucose at home    Patient Active Problem List   Diagnosis Date Noted  . DM neuropathy with neurologic  complication ([place]) 85/88/5027  . GERD (gastroesophageal reflux disease) 02/28/2018  . Nephrolithiasis 02/28/2018  . Sciatic leg pain 02/28/2018  . Snores 02/28/2018  . Orthopnea 02/23/2018  . Elevated troponin 02/23/2018  . HNP (herniated nucleus pulposus), lumbar 07/03/2017  . Chest pain in adult 04/27/2017  . Restless leg syndrome, uncontrolled 04/27/2017  . 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 Anemia of chronic disease 03/17/2017  . ESRD (end stage renal disease) on dialysis ([place]) 03/17/2017  . Chronic chest pain 03/17/2017  . Type II diabetes mellitus ([place]) 03/17/2017  . Acute on chronic diastolic heart failure ([place]) 03/17/2017  . Acute heart failure ([place]) 03/17/2017  . Lumbar radiculopathy 01/16/2017  . Pre-transplant evaluation for kidney transplant 06/15/2016  . Increased frequency of urination 12/17/2015  . Nocturia 12/17/2015  . Chest pain, non-cardiac 10/09/2015  . Acute on chronic renal failure ([place]) 06/16/2014  . Shoulder pain, left 12/15/2013  . Chest pain 12/15/2013  . Claudication ([place]) 12/11/2013  . PVD (peripheral vascular disease) ([place]) 12/11/2013  . Carotid artery disease ([place]) 09/30/2013  . Acute chest pain 11/15/2012  . Bruit 09/15/2010  . CAD (coronary artery disease) nonobstructive per cath 2012   . DM (diabetes mellitus) (  [place]) 07/22/2009  . Hyperlipidemia LDL goal <70 07/22/2009  . Essential hypertension 07/22/2009    Past Surgical History:  Procedure Laterality Date  . ANGIOPLASTY / STENTING FEMORAL Left 12/11/2013   dr berry  . AV FISTULA PLACEMENT Left 03/19/2014   Procedure: CREATION OF ARTERIOVENOUS (AV) FISTULA  LEFT UPPER ARM;  Surgeon: Mal Misty, MD;  Location: [facility];  Service: Vascular;  Laterality: Left;  . CARDIAC CATHETERIZATION  2001 and 2010   . COLONOSCOPY W/ BIOPSIES AND POLYPECTOMY    . COLONOSCOPY WITH PROPOFOL N/A 08/01/2016   Procedure: COLONOSCOPY WITH PROPOFOL;  Surgeon: Carol Ada, MD;  Location: [facility];   Service: Endoscopy;  Laterality: N/A;  . CORONARY ARTERY BYPASS GRAFT  2018  . ESOPHAGOGASTRODUODENOSCOPY (EGD) WITH PROPOFOL N/A 08/01/2016   Procedure: ESOPHAGOGASTRODUODENOSCOPY (EGD) WITH PROPOFOL;  Surgeon: Carol Ada, MD;  Location: [facility];  Service: Endoscopy;  Laterality: N/A;  . FOOT FRACTURE SURGERY Right    ligament repair  . FRACTURE SURGERY    . INGUINAL HERNIA REPAIR Left   . LEFT HEART CATH AND CORS/GRAFTS ANGIOGRAPHY N/A 04/27/2017   Procedure: LEFT HEART CATH AND CORS/GRAFTS ANGIOGRAPHY;  Surgeon: Leonie Man, MD;  Location: [facility];  Service: Cardiovascular;  Laterality: N/A;  . LEFT HEART CATHETERIZATION WITH CORONARY ANGIOGRAM N/A 06/22/2014   Procedure: LEFT HEART CATHETERIZATION WITH CORONARY ANGIOGRAM;  Surgeon: Troy Sine, MD;  Location: [facility] CATH LAB;  Service: Cardiovascular;  Laterality: N/A;  . LOWER EXTREMITY ANGIOGRAM Left 12/11/2013   Procedure: LOWER EXTREMITY ANGIOGRAM;  Surgeon: Lorretta Harp, MD;  Location[facility] C[facility] LAB;  Service: Cardiovascular;  Laterality: Left;  . LUMBAR LAMINECTOMY/DECOMPRESSION MICRODISCECTOMY Right 07/03/2017   Procedure: MICRODISCECTOMY LUMBAR FIVE - SACRAL ONE RIGHT;  Surgeon: Consuella Lose, MD;  Location: [facility];  Service: Neurosurgery;  Laterality: Right;  . LUMBAR LAMINECTOMY/DECOMPRESSION MICRODISCECTOMY Right 10/19/2017   Procedure: MICRODISCECTOMY LUMBAR FIVE- SACRAL 1 ONE ;  Surgeon: Consuella Lose, MD;  Location: [facility];  Service: Neurosurgery;  Laterality: Right;  . TONSILLECTOMY AND ADENOIDECTOMY    . WISDOM TOOTH EXTRACTION         Family History  Problem Relation Age of Onset  . Heart attack Sister        died @ 5  . Cancer Mother        died @ 38; unknown type  . Diabetes Brother        deceased  . Cirrhosis Father        alcohol related  . Diabetes Father   . Esophageal cancer Neg Hx   . Colon cancer Neg Hx   . Pancreatic cancer Neg Hx   . Stomach cancer Neg Hx     Social  History   Tobacco Use  . Smoking status: Former Smoker    Packs/day: 1.00    Years: 2.00    Pack years: 2.00    Types: Cigarettes  . Smokeless tobacco: Never Used  . Tobacco comment: quit smoking 40 yrs ago  Substance Use Topics  . Alcohol use: No    Alcohol/week: 0.0 standard drinks  . Drug use: No    Home Medications Prior to Admission medications   Medication Sig Start Date End Date Taking? Authorizing Provider  acetaminophen (TYLENOL) 500 MG tablet Take 1,000 mg by mouth every 4 (four) hours as needed for moderate pain or headache.    Yes [provider]  aspirin EC 81 MG tablet Take 1 tablet (81 mg total) by mouth  daily. 05/28/12  Yes Kathlen Mody, Scott T, PA-C  atorvastatin (LIPITOR) 40 MG tablet Take 1 tablet (40 mg total) by mouth daily at 6 PM. 12/13/13  Yes Brett Canales, PA-C  calcium acetate (PHOSLO) 667 MG capsule Take 3 capsules (2,001 mg total) by mouth 3 (three) times daily with meals. Patient taking differently: Take 1,334 mg by mouth 2 (two) times daily.  10/09/15  Yes Short, Noah Delaine, MD  cephALEXin (KEFLEX) 500 MG capsule Take 1 capsule (500 mg total) by mouth 2 (two) times daily for 10 days. 04/22/19 05/02/19 Yes Marzetta Board, DPM  clopidogrel (PLAVIX) 75 MG tablet Take 75 mg by mouth daily. 07/29/18  Yes [provider]  clotrimazole (LOTRIMIN) 1 % cream Apply between toes and under toes twice daily for 4 weeks. 09/17/18  Yes Marzetta Board, DPM  gabapentin (NEURONTIN) 100 MG capsule Take 100 mg by mouth 2 (two) times daily.    Yes [provider]  loperamide (IMODIUM) 2 MG capsule Take 2 mg by mouth as needed for diarrhea or loose stools.   Yes [provider]  multivitamin (RENA-VIT) TABS tablet Take 1 tablet by mouth at bedtime. 06/23/14  Yes Domenic Polite, MD  mupirocin ointment (BACTROBAN) 2 % Apply to right foot once daily. 04/22/19 04/21/20 Yes G[place]way, Stephani Police, DPM  pantoprazole (PROTONIX) 40 MG tablet Take 40 mg by  mouth daily.   Yes [provider]  polyethylene glycol (MIRALAX / GLYCOLAX) packet Take 17 g by mouth daily as needed for moderate constipation. 03/22/17  Yes Aline August, MD  amLODipine (NORVASC) 5 MG tablet Take 1 tablet (5 mg total) by mouth daily for 30 days. 07/20/18 08/23/18  Elodia Florence., MD  cyclobenzaprine (FLEXERIL) 10 MG tablet Take 10 mg by mouth 3 (three) times daily. 07/31/18   [provider]  HYDROcodone-acetaminophen (NORCO/VICODIN) 5-325 MG tablet Take 1 tablet by mouth every 4 (four) hours as needed. 06/14/18   Tacy Learn, PA-C  nitroGLYCERIN (NITROSTAT) 0.4 MG SL tablet Place 1 tablet (0.4 mg total) under the tongue every 5 (five) minutes as needed for chest pain. 03/20/12   Barrett, Evelene Croon, PA-C  oxyCODONE (OXY IR/ROXICODONE) 5 MG immediate release tablet Take 5 mg by mouth every 6 (six) hours as needed for moderate pain.  08/28/18   [provider]  tamsulosin (FLOMAX) 0.4 MG CAPS capsule Take 0.4 mg by mouth daily. 05/23/18   [provider]    Allergies    Kiwi extract, Flexeril [cyclobenzaprine], Other, and Tape  Review of Systems   Review of Systems  Constitutional: Positive for fatigue. Negative for chills and fever.  HENT: Negative for ear pain and sore throat.   Eyes: Negative for pain and visual disturbance.  Respiratory: Negative for cough and shortness of breath.   Cardiovascular: Negative for chest pain and palpitations.  Gastrointestinal: Positive for diarrhea, nausea and vomiting. Negative for abdominal pain.  Genitourinary: Negative for dysuria and hematuria.  Musculoskeletal: Negative for arthralgias and back pain.  Skin: Negative for color change and rash.  Neurological: Negative for seizures and syncope.  All other systems reviewed and are negative.   Physical Exam Updated Vital Signs BP 109/73   Pulse (!) 110   Temp 98.2 F (36.8 C) (Oral)   Resp 13   SpO2 93%   Physical Exam Vitals and  nursing note reviewed.  Constitutional:      General: He is not in acute distress.    Appearance: He is  well-developed. He is ill-appearing. He is not toxic-appearing.     Comments: Frail, cachectic appearing  HENT:     Head: Normocephalic and atraumatic.     Right Ear: External ear normal.     Left Ear: External ear normal.     Mouth/Throat:     Mouth: Mucous membranes are dry.     Pharynx: Oropharynx is clear.  Eyes:     Conjunctiva/sclera: Conjunctivae normal.  Cardiovascular:     Rate and Rhythm: Normal rate and regular rhythm.     Heart sounds: No murmur.  Pulmonary:     Effort: Pulmonary effort is normal. No respiratory distress.     Breath sounds: Normal breath sounds.  Abdominal:     Palpations: Abdomen is soft.     Tenderness: There is no abdominal tenderness.  Musculoskeletal:        General: No swelling or tenderness.     Cervical back: Normal range of motion and neck supple.  Skin:    General: Skin is warm and dry.     Capillary Refill: Capillary refill takes less than 2 seconds.  Neurological:     General: No focal deficit present.     Mental Status: He is alert. Mental status is at baseline.  Psychiatric:        Mood and Affect: Mood normal.        Behavior: Behavior normal.     ED Results / Procedures / Treatments   Labs (all labs ordered are listed, but only abnormal results are displayed) Labs Reviewed  LACTIC ACID, PLASMA - Abnormal; Notable for the following components:      Result Value   Lactic Acid, Venous 2.1 (*)    All other components within normal limits  COMPREHENSIVE METABOLIC PANEL - Abnormal; Notable for the following components:   Chloride 89 (*)    Glucose, Bld 153 (*)    BUN 56 (*)    Creatinine, Ser 16.77 (*)    Calcium 8.6 (*)    Albumin 3.2 (*)    AST 12 (*)    GFR calc non Af Amer 3 (*)    GFR calc Af Amer 3 (*)    Anion gap 24 (*)    All other components within normal limits  PHOSPHORUS - Abnormal; Notable for the  following components:   Phosphorus 5.9 (*)    All other components within normal limits  SARS CORONAVIRUS 2 (TAT 6-24 HRS)  GI PATHOGEN PANEL BY PCR, STOOL  CBC WITH DIFFERENTIAL/PLATELET  MAGNESIUM  LACTIC ACID, PLASMA  LACTIC ACID, PLASMA  LACTIC ACID, PLASMA  POC SARS CORONAVIRUS 2 AG -  ED    EKG EKG Interpretation  Date/Time:  Thursday April 24 2019 17:16:43 EST Ventricular Rate:  102 PR Interval:    QRS Duration: 89 QT Interval:  334 QTC Calculation: 435 R Axis:   87 Text Interpretation: Sinus tachycardia Ventricular premature complex Anterior infarct, old Confirmed by Sherwood Gambler [phone]) on 04/24/2019 5:38:18 PM   Radiology CT ABDOMEN PELVIS W CONTRAST  Result Date: 04/24/2019 CLINICAL DATA:  Acute generalized abdominal pain with neutropenia, missed Wednesday dialysis EXAM: CT ABDOMEN AND PELVIS WITH CONTRAST TECHNIQUE: Multidetector CT imaging of the abdomen and pelvis was performed using the standard protocol following bolus administration of intravenous contrast. CONTRAST:  100 mL Omnipaque 300 COMPARISON:  CT abdomen pelvis 07/17/2018 FINDINGS: Lower chest: Atelectatic change present the right lung base. Cardiac size within normal limits. No pericardial effusion. Dense calcification of mitral annulus is  noted. Calcifications also present on the aortic valve leaflets. Three-vessel coronary artery disease is present. Bilateral mild gynecomastia is noted. Hepatobiliary: Hypoattenuating focus along the anterior segment 4 of the liver, could reflect focal fatty infiltration or a benign process such as liver cyst or ciliated foregut cyst. No worrisome hepatic lesions. Mild gallbladder distention with borderline mural thickening but without visible calcified intraluminal or intraductal gallstones. No biliary ductal dilatation. Pancreas: Unremarkable. No pancreatic ductal dilatation or surrounding inflammatory changes. Spleen: Normal in size without focal abnormality.  Adrenals/Urinary Tract: Stable appearance of the bilateral nodular adrenal glands largest measuring up to 0.7 cm in the lateral limb of the right adrenal gland. These have been previously characterized as adrenal adenomas. There is extensive bilateral renal atrophy. No worrisome renal lesions. Mild bilateral symmetric perinephric stranding, a nonspecific finding in the setting of dialysis dependence. No urolithiasis or hydronephrosis. Urinary bladder is largely decompressed at the time of exam and therefore poorly evaluated by CT imaging. Stomach/Bowel: Distal esophagus, stomach and duodenal sweep are unremarkable. No small bowel wall thickening or dilatation. No evidence of obstruction. A normal appendix is visualized. Diffuse pancolonic edematous mural thickening with mucosal hyperemia and faint pericolonic hazy stranding. The colon is fluid-filled. Vascular/Lymphatic: Atherosclerotic plaque within the normal caliber aorta. No suspicious or enlarged lymph nodes in the included lymphatic chains. Reproductive: Mild prostatomegaly. Seminal vesicles are unremarkable. Other: No bowel containing hernias. Evidence of prior right inguinal hernia repair. No free fluid or free air in the abdomen or pelvis. Faint pericolonic stranding, as detailed above. Musculoskeletal: Prior L4-S1 posterior spinal fusion and decompression no evidence of acute hardware complication. Chronic endplate changes L3 are similar to comparison exam. No acute or worrisome osseous lesions. Additional degenerative changes noted in the lower thoracic spine, both SI joints and hips. IMPRESSION: 1. Diffuse pancolonic edematous mural thickening with mucosal hyperemia and faint pericolonic hazy stranding, consistent with colitis of infectious or inflammatory etiology. 2. Mild gallbladder distention with borderline mural thickening but without visible calcified intraluminal or intraductal gallstones. If there is concern for acute cholecystitis, recommend  further evaluation with right upper quadrant ultrasound. 3. Bilateral renal atrophy and faint perinephric stranding, similar to comparison. 4. Stable bilateral adrenal adenomas. 5. Three-vessel coronary artery disease. 6. Aortic Atherosclerosis (ICD10-I70.0). 7. Prior L4-S1 posterior spinal fusion and decompression. Adjacent segment disease at L3-4 is similar to comparison. 8. Mild prostatomegaly. Electronically Signed   By: Lovena Le M.D.   On: 04/24/2019 20:52   DG Chest Port 1 View  Result Date: 04/24/2019 CLINICAL DATA:  Vomiting EXAM: PORTABLE CHEST 1 VIEW COMPARISON:  11/04/2018 FINDINGS: Heart and mediastinal contours are within normal limits. No focal opacities or effusions. No acute bony abnormality. Aortic atherosclerosis. IMPRESSION: No active disease. Electronically Signed   By: Rolm Baptise M.D.   On: 04/24/2019 18:35    Procedures Procedures (including critical care time)  Medications Ordered in ED Medications  ondansetron (ZOFRAN) injection 4 mg (4 mg Intravenous Given 04/24/19 1932)  lactated ringers bolus 500 mL (500 mLs Intravenous New Bag/Given 04/24/19 1932)  iohexol (OMNIPAQUE) 300 MG/ML solution 100 mL (100 mLs Intravenous Contrast Given 04/24/19 2041)  morphine 2 MG/ML injection 2 mg (2 mg Intravenous Given 04/24/19 2119)    ED Course  I have reviewed the triage vital signs and the nursing notes.  Pertinent labs & imaging results that were available during my care of the patient were reviewed by me and considered in my medical decision making (see chart for details).  MDM Rules/Calculators/A&P                       69 year old male with nausea, diarrhea and vomiting in the setting of dialysis, missed dialysis yesterday.  On arrival patient was hypotensive in the mid 33A systolic, mentating well, slightly tachycardic at 110.  Patient does not appear extremely volume overloaded to me, no JVD, chest x-ray clear.  My concern to be COVID-19 versus viral GI illness.   Patient was mildly tender to palpation, will obtain CT abdomen pelvis given his age as well as his continued symptoms.  Zofran given with small fluid bolus as well.  Patient did miss dialysis yesterday, potassium normal, no EKG changes, patient is hyperphosphatemic, magnesium normal, no acute indication for dialysis at this time.  We will try to control patient's symptoms in the ED, chest x-ray clear for any type of pneumonia, lactic acid mildly elevated, CBC reassuring otherwise.  CT scan pending at this time, patient symptoms do not resolve, likely may need admission for intractable nausea and vomiting.   Metabolic panel likely with AKI secondary to need for dialysis, element of volume depletion also likely, repeated lactic acid, pending currently.  Patient's blood pressures improved with fluid resuscitation.  CT scan revealed a diffuse colitis, likely viral in nature, doubt bacterial given patient is afebrile and otherwise well-appearing, tachycardia resolving as well with fluid.  GI pathogen panel sent.  Covid negative.  May be element of small amount of ischemia given patient's hypotension and nausea and vomiting with volume depletion, could cause a hypoperfusion state, however no sign of thrombus or any type of mesenteric ischemia currently.  Given patient's intractable nausea vomiting as well as abdominal pain and diffuse colitis, as well as need for dialysis tomorrow, patient will be admitted to the hospitalist.  Admitted in stable condition.  The attending physician was present and available for all medical decision making and procedures related to this patient's care.             Final Clinical Impression(s) / ED Diagnoses Final diagnoses:  Vomiting    Rx / DC Orders ED Discharge Orders    None       Kizzie Fantasia, MD 04/24/19 2135    Sherwood Gambler, MD 04/24/19 2229

## 2019-04-25 DIAGNOSIS — Z6823 Body mass index (BMI) 23.0-23.9, adult: Secondary | ICD-10-CM | POA: Diagnosis not present

## 2019-04-25 DIAGNOSIS — E860 Dehydration: Secondary | ICD-10-CM | POA: Diagnosis present

## 2019-04-25 DIAGNOSIS — D631 Anemia in chronic kidney disease: Secondary | ICD-10-CM | POA: Diagnosis present

## 2019-04-25 DIAGNOSIS — I252 Old myocardial infarction: Secondary | ICD-10-CM | POA: Diagnosis not present

## 2019-04-25 DIAGNOSIS — K529 Noninfective gastroenteritis and colitis, unspecified: Secondary | ICD-10-CM | POA: Diagnosis present

## 2019-04-25 DIAGNOSIS — Z20822 Contact with and (suspected) exposure to covid-19: Secondary | ICD-10-CM | POA: Diagnosis present

## 2019-04-25 DIAGNOSIS — I493 Ventricular premature depolarization: Secondary | ICD-10-CM | POA: Diagnosis present

## 2019-04-25 DIAGNOSIS — M79671 Pain in right foot: Secondary | ICD-10-CM | POA: Diagnosis present

## 2019-04-25 DIAGNOSIS — I953 Hypotension of hemodialysis: Secondary | ICD-10-CM | POA: Diagnosis present

## 2019-04-25 DIAGNOSIS — Z992 Dependence on renal dialysis: Secondary | ICD-10-CM | POA: Diagnosis not present

## 2019-04-25 DIAGNOSIS — R64 Cachexia: Secondary | ICD-10-CM | POA: Diagnosis present

## 2019-04-25 DIAGNOSIS — E1122 Type 2 diabetes mellitus with diabetic chronic kidney disease: Secondary | ICD-10-CM | POA: Diagnosis present

## 2019-04-25 DIAGNOSIS — E785 Hyperlipidemia, unspecified: Secondary | ICD-10-CM | POA: Diagnosis present

## 2019-04-25 DIAGNOSIS — D3502 Benign neoplasm of left adrenal gland: Secondary | ICD-10-CM | POA: Diagnosis present

## 2019-04-25 DIAGNOSIS — G2581 Restless legs syndrome: Secondary | ICD-10-CM | POA: Diagnosis present

## 2019-04-25 DIAGNOSIS — N186 End stage renal disease: Secondary | ICD-10-CM | POA: Diagnosis present

## 2019-04-25 DIAGNOSIS — I120 Hypertensive chronic kidney disease with stage 5 chronic kidney disease or end stage renal disease: Secondary | ICD-10-CM | POA: Diagnosis present

## 2019-04-25 DIAGNOSIS — N2581 Secondary hyperparathyroidism of renal origin: Secondary | ICD-10-CM | POA: Diagnosis present

## 2019-04-25 DIAGNOSIS — E1140 Type 2 diabetes mellitus with diabetic neuropathy, unspecified: Secondary | ICD-10-CM | POA: Diagnosis present

## 2019-04-25 DIAGNOSIS — I10 Essential (primary) hypertension: Secondary | ICD-10-CM | POA: Diagnosis not present

## 2019-04-25 DIAGNOSIS — R1110 Vomiting, unspecified: Secondary | ICD-10-CM | POA: Diagnosis not present

## 2019-04-25 DIAGNOSIS — K219 Gastro-esophageal reflux disease without esophagitis: Secondary | ICD-10-CM | POA: Diagnosis present

## 2019-04-25 DIAGNOSIS — E1151 Type 2 diabetes mellitus with diabetic peripheral angiopathy without gangrene: Secondary | ICD-10-CM | POA: Diagnosis present

## 2019-04-25 DIAGNOSIS — D3501 Benign neoplasm of right adrenal gland: Secondary | ICD-10-CM | POA: Diagnosis present

## 2019-04-25 DIAGNOSIS — R197 Diarrhea, unspecified: Secondary | ICD-10-CM | POA: Diagnosis present

## 2019-04-25 DIAGNOSIS — I2510 Atherosclerotic heart disease of native coronary artery without angina pectoris: Secondary | ICD-10-CM | POA: Diagnosis present

## 2019-04-25 LAB — BASIC METABOLIC PANEL
Anion gap: 19 — ABNORMAL HIGH (ref 5–15)
BUN: 59 mg/dL — ABNORMAL HIGH (ref 8–23)
CO2: 28 mmol/L (ref 22–32)
Calcium: 8.2 mg/dL — ABNORMAL LOW (ref 8.9–10.3)
Chloride: 87 mmol/L — ABNORMAL LOW (ref 98–111)
Creatinine, Ser: 17.37 mg/dL — ABNORMAL HIGH (ref 0.61–1.24)
GFR calc Af Amer: 3 mL/min — ABNORMAL LOW (ref >60)
GFR calc non Af Amer: 2 mL/min — ABNORMAL LOW (ref >60)
Glucose, Bld: 95 mg/dL (ref 70–99)
Potassium: 4.8 mmol/L (ref 3.5–5.1)
Sodium: 134 mmol/L — ABNORMAL LOW (ref 135–145)

## 2019-04-25 LAB — CBC
HCT: 41.1 % (ref 39.0–52.0)
HCT: 41.4 % (ref 39.0–52.0)
Hemoglobin: 12.8 g/dL — ABNORMAL LOW (ref 13.0–17.0)
Hemoglobin: 13.2 g/dL (ref 13.0–17.0)
MCH: 30.2 pg (ref 26.0–34.0)
MCH: 30.3 pg (ref 26.0–34.0)
MCHC: 31.1 g/dL (ref 30.0–36.0)
MCHC: 31.9 g/dL (ref 30.0–36.0)
MCV: 95 fL (ref 80.0–100.0)
MCV: 96.9 fL (ref 80.0–100.0)
Platelets: 228 10*3/uL (ref 150–400)
Platelets: 235 10*3/uL (ref 150–400)
RBC: 4.24 MIL/uL (ref 4.22–5.81)
RBC: 4.36 MIL/uL (ref 4.22–5.81)
RDW: 13.9 % (ref 11.5–15.5)
RDW: 14.2 % (ref 11.5–15.5)
WBC: 8.6 10*3/uL (ref 4.0–10.5)
WBC: 9.7 10*3/uL (ref 4.0–10.5)
nRBC: 0 % (ref 0.0–0.2)
nRBC: 0 % (ref 0.0–0.2)

## 2019-04-25 LAB — HIV ANTIBODY (ROUTINE TESTING W REFLEX): HIV Screen 4th Generation wRfx: NONREACTIVE

## 2019-04-25 LAB — GLUCOSE, CAPILLARY
Glucose-Capillary: 82 mg/dL (ref 70–99)
Glucose-Capillary: 83 mg/dL (ref 70–99)
Glucose-Capillary: 59 mg/dL — ABNORMAL LOW (ref 70–99)
Glucose-Capillary: 96 mg/dL (ref 70–99)

## 2019-04-25 LAB — C DIFFICILE QUICK SCREEN W PCR REFLEX
C Diff antigen: NEGATIVE
C Diff interpretation: NOT DETECTED
C Diff toxin: NEGATIVE

## 2019-04-25 LAB — CREATININE, SERUM
Creatinine, Ser: 16.79 mg/dL — ABNORMAL HIGH (ref 0.61–1.24)
GFR calc Af Amer: 3 mL/min — ABNORMAL LOW (ref >60)
GFR calc non Af Amer: 3 mL/min — ABNORMAL LOW (ref >60)

## 2019-04-25 LAB — SARS CORONAVIRUS 2 (TAT 6-24 HRS)
SARS Coronavirus 2: NEGATIVE
SARS Coronavirus 2: NEGATIVE

## 2019-04-25 LAB — LACTOFERRIN, FECAL, QUALITATIVE: Lactoferrin, Fecal, Qual: POSITIVE — AB

## 2019-04-25 LAB — LACTIC ACID, PLASMA
Lactic Acid, Venous: 2.2 mmol/L (ref 0.5–1.9)
Lactic Acid, Venous: 1.1 mmol/L (ref 0.5–1.9)

## 2019-04-25 LAB — MRSA PCR SCREENING: MRSA by PCR: NEGATIVE

## 2019-04-25 LAB — HEPATITIS B SURFACE ANTIGEN: Hepatitis B Surface Ag: NONREACTIVE

## 2019-04-25 MED ORDER — CALCITRIOL 0.5 MCG PO CAPS: 0.5000 ug | ORAL_CAPSULE | ORAL | Status: DC

## 2019-04-25 MED ORDER — DEXTROSE 50 % IV SOLN
INTRAVENOUS | Status: AC
  Administered 2019-04-25: 50 mL
  Filled 2019-04-25: qty 50

## 2019-04-25 MED ORDER — AMLODIPINE BESYLATE 5 MG PO TABS
5.0000 mg | ORAL_TABLET | Freq: Every day | ORAL | Status: DC
  Filled 2019-04-25: qty 1

## 2019-04-25 MED ORDER — HEPARIN SODIUM (PORCINE) 1000 UNIT/ML DIALYSIS: 5000.0000 [IU] | Freq: Once | INTRAMUSCULAR | Status: AC

## 2019-04-25 MED ORDER — HEPARIN SODIUM (PORCINE) 1000 UNIT/ML IJ SOLN
INTRAMUSCULAR | Status: AC
  Administered 2019-04-25: 5000 [IU] via INTRAVENOUS_CENTRAL
  Filled 2019-04-25: qty 5

## 2019-04-25 MED ORDER — WHITE PETROLATUM EX OINT
TOPICAL_OINTMENT | CUTANEOUS | Status: AC
  Administered 2019-04-25: 0.2
  Filled 2019-04-25: qty 28.35

## 2019-04-25 MED ORDER — CHLORHEXIDINE GLUCONATE CLOTH 2 % EX PADS: 6.0000 | MEDICATED_PAD | Freq: Every day | CUTANEOUS | Status: DC

## 2019-04-25 NOTE — Progress Notes (Signed)
New Admission Note: ? Arrival Method: Via stretcher Mental Orientation: A/O x 4  Telemetry: n/a Assessment: Completed Skin: Refer to flowsheet IV: Right FA Pain: none Tubes: Safety Measures: Safety Fall Prevention Plan discussed with patient. Admission: Completed [facility] [place] Orientation: Patient has been orientated to the room, unit and the staff. Family: Orders have been reviewed and are being implemented. Will continue to monitor the patient. Call light has been placed within reach and bed alarm has been activated.  ? [employer], [place]

## 2019-04-25 NOTE — Procedures (Signed)
   I was present at this dialysis session, have reviewed the session itself and made  appropriate changes Kelly Splinter MD [place] pager [phone]   04/25/2019, 3:12 PM

## 2019-04-25 NOTE — Progress Notes (Signed)
PROGRESS NOTE  Jon Anderson CNO:[id number] DOB: 06/03/1950 DOA: 04/24/2019 PCP: [name], MD   LOS: 0 days   Brief narrative: As per HPI, Patient is a 69 year old African-American male with past medical history significant for end-stage renal disease on hemodialysis on Monday, Wednesday and Friday; diabetes mellitus type 2, sleep apnea, peripheral artery disease, hypertension, hyperlipidemia, GERD, coronary artery disease, carotid stenosis and anemia of chronic kidney disease presented to hospital with nausea vomiting and diarrhea. Apparently, patient had missed his dialysis x1. Patient's problem started about 4 days ago with diarrhea.  Patient reported 3-5 episodes of watery stool.  Patient developed nausea and vomiting 1 to 2 days afterwards, but main problem remained diarrhea.  Patient has continued to have diarrhea stools.  ED Course: On presentation to the ER, temperature was 98.2, heart rate of 66 to 110 bpm, respiratory rate of 17, blood pressure 109/77 with O2 sat of 88%.  She is currently being managed supportively. Pertinent labs: CBC reveals WBC of 8.6, hemoglobin of 12.8, hematocrit of 41.1 with platelet count of 328.  Sodium is 138, potassium of 4.1, chloride of 89, CO2 of 25, BUN of 56 with creatinine of 16.79.  Assessment/Plan:  Active Problems:   Gastroenteritis  Acute gastroenteritis: Patient still has intractable nausea and multiple episodes of diarrhea and is symptomatic.  Currently n.p.o.  Will start patient on clears today.  GI pathogen panel is pending.  C. difficile was negative.  We will continue to monitor closely.  Try as needed Imodium.  Continue antiemetics,. Covid test sent out pending.  Continue Protonix.  Hold off with IV fluids due to end-stage renal disease. Increase clears as tolerated and advance diet by a.m.  ESRD on hemodialysis Monday Wednesday Friday: -Nephrology on board for hemodialysis today.  Essential hypertension: -Controlled.  Resume  amlodipine.  Coronary artery disease/MI: Stable at this time.  Continue aspirin Plavix.   VTE Prophylaxis: Heparin subcu  Code Status: Full code  Family Communication: None  Disposition Plan: Home in 1 to 2 days.  We will change patient status to patient since patient still feels symptomatic with ongoing diarrhea and nausea.  He feels very fatigued and has generalized weakness.  Will get PT evaluation in a.m.  Start clears today and see if he would tolerate oral diet by a.m..  For hemodialysis today.   Consultants:  Nephrology  Procedures:  Hemodialysis  Antibiotics:  Anti-infectives (From admission, onward)   Start     Dose/Rate Route Frequency Ordered Stop   04/24/19 2230  cephALEXin (KEFLEX) capsule 500 mg     500 mg Oral 2 times daily 04/24/19 2225        Subjective: Today, still has intractable nausea and had few episodes of diarrhea until this morning.  Patient however wants to to try some oral.  Denies vomiting   Denies abdominal pain.    Objective: Vitals:   04/25/19 1348 04/25/19 1400  BP: (!) 76/49 (!) (P) 87/47  Pulse: 98 (P) 100  Resp:    Temp:    SpO2:      Intake/Output Summary (Last 24 hours) at 04/25/2019 1425 Last data filed at 04/25/2019 0449 Gross per 24 hour  Intake 0 ml  Output 0 ml  Net 0 ml   Filed Weights   04/25/19 0449 04/25/19 1140  Weight: 72.5 kg 72.5 kg   Body mass index is 22.29 kg/m.   Physical Exam: GENERAL: Patient is alert awake and oriented. Not in obvious distress. HENT: No scleral pallor  or icterus. Pupils equally reactive to light. Oral mucosa is moist NECK: is supple, no palpable thyroid enlargement. CHEST: Clear to auscultation. No crackles or wheezes. Non tender on palpation. Diminished breath sounds bilaterally. CVS: S1 and S2 heard, no murmur. Regular rate and rhythm. No pericardial rub. ABDOMEN: Soft, non-tender, bowel sounds are present. EXTREMITIES: No edema. CNS: Cranial nerves are intact. No focal motor  or sensory deficits. SKIN: warm and dry without rashes.  Data Review: I have personally reviewed the following laboratory data and studies,  CBC: Recent Labs  Lab 04/24/19 1654 04/25/19 0042 04/25/19 0258  WBC 9.1 8.6 9.7  NEUTROABS 6.7  --   --   HGB 13.8 12.8* 13.2  HCT 42.8 41.1 41.4  MCV 94.5 96.9 95.0  PLT 237 228 672   Basic Metabolic Panel: Recent Labs  Lab 04/24/19 1654 04/25/19 0042 04/25/19 0258  NA 138  --  134*  K 4.1  --  4.8  CL 89*  --  87*  CO2 25  --  28  GLUCOSE 153*  --  95  BUN 56*  --  59*  CREATININE 16.77* 16.79* 17.37*  CALCIUM 8.6*  --  8.2*  MG 1.8  --   --   PHOS 5.9*  --   --    Liver Function Tests: Recent Labs  Lab 04/24/19 1654  AST 12*  ALT 8  ALKPHOS 81  BILITOT 0.6  PROT 7.7  ALBUMIN 3.2*   No results for input(s): LIPASE, AMYLASE in the last 168 hours. No results for input(s): AMMONIA in the last 168 hours. Cardiac Enzymes: No results for input(s): CKTOTAL, CKMB, CKMBINDEX, TROPONINI in the last 168 hours. BNP (last 3 results) Recent Labs    07/12/18 0756  BNP 201.8*    ProBNP (last 3 results) No results for input(s): PROBNP in the last 8760 hours.  CBG: Recent Labs  Lab 04/25/19 0442 04/25/19 0726  GLUCAP 82 83   Recent Results (from the past 240 hour(s))  SARS CORONAVIRUS 2 (TAT 6-24 HRS) Nasopharyngeal Nasopharyngeal Swab     Status: None   Collection Time: 04/24/19  9:35 PM   Specimen: Nasopharyngeal Swab  Result Value Ref Range Status   SARS Coronavirus 2 NEGATIVE NEGATIVE Final    Comment: (NOTE) SARS-CoV-2 target nucleic acids are NOT DETECTED. The SARS-CoV-2 RNA is generally detectable in upper and lower respiratory specimens during the acute phase of infection. Negative results do not preclude SARS-CoV-2 infection, do not rule out co-infections with other pathogens, and should not be used as the sole basis for treatment or other patient management decisions. Negative results must be combined  with clinical observations, patient history, and epidemiological information. The expected result is Negative. Fact Sheet for Patients: SugarRoll.be Fact Sheet for Healthcare Providers: https://www.woods-mathews.com/ This test is not yet approved or cleared by the [place] FDA and  has been authorized for detection and/or diagnosis of SARS-CoV-2 by FDA under an Emergency Use Authorization (EUA). This EUA will remain  in effect (meaning this test can be used) for the duration of the COVID-19 declaration under Section 56 4(b)(1) of the Act, 21 U.S.C. section 360bbb-3(b)(1), unless the authorization is terminated or revoked sooner. Performed at [facility], [place] [address] [place], [place] [address]   C difficile quick scan w PCR reflex     Status: None   Collection Time: 04/25/19  5:16 AM   Specimen: Stool  Result Value Ref Range Status   C Diff antigen NEGATIVE  NEGATIVE Final   C Diff toxin NEGATIVE NEGATIVE Final   C Diff interpretation No C. difficile detected.  Final    Comment: Performed at [facility], [place] [address], [place], [place] [address]  MRSA PCR Screening     Status: None   Collection Time: 04/25/19  6:11 AM   Specimen: Nasal Mucosa; Nasopharyngeal  Result Value Ref Range Status   MRSA by PCR NEGATIVE NEGATIVE Final    Comment:        The GeneXpert MRSA Assay (FDA approved for NASAL specimens only), is one component of a comprehensive MRSA colonization surveillance program. It is not intended to diagnose MRSA infection nor to guide or monitor treatment for MRSA infections. Performed at [facility], [place] [address], [place], [place] [address]      Studies: CT ABDOMEN PELVIS W CONTRAST  Result Date: 04/24/2019 CLINICAL DATA:  Acute generalized abdominal pain with neutropenia, missed Wednesday dialysis EXAM: CT ABDOMEN AND PELVIS WITH CONTRAST TECHNIQUE: Multidetector CT imaging of the abdomen  and pelvis was performed using the standard protocol following bolus administration of intravenous contrast. CONTRAST:  100 mL Omnipaque 300 COMPARISON:  CT abdomen pelvis 07/17/2018 FINDINGS: Lower chest: Atelectatic change present the right lung base. Cardiac size within normal limits. No pericardial effusion. Dense calcification of mitral annulus is noted. Calcifications also present on the aortic valve leaflets. Three-vessel coronary artery disease is present. Bilateral mild gynecomastia is noted. Hepatobiliary: Hypoattenuating focus along the anterior segment 4 of the liver, could reflect focal fatty infiltration or a benign process such as liver cyst or ciliated foregut cyst. No worrisome hepatic lesions. Mild gallbladder distention with borderline mural thickening but without visible calcified intraluminal or intraductal gallstones. No biliary ductal dilatation. Pancreas: Unremarkable. No pancreatic ductal dilatation or surrounding inflammatory changes. Spleen: Normal in size without focal abnormality. Adrenals/Urinary Tract: Stable appearance of the bilateral nodular adrenal glands largest measuring up to 0.7 cm in the lateral limb of the right adrenal gland. These have been previously characterized as adrenal adenomas. There is extensive bilateral renal atrophy. No worrisome renal lesions. Mild bilateral symmetric perinephric stranding, a nonspecific finding in the setting of dialysis dependence. No urolithiasis or hydronephrosis. Urinary bladder is largely decompressed at the time of exam and therefore poorly evaluated by CT imaging. Stomach/Bowel: Distal esophagus, stomach and duodenal sweep are unremarkable. No small bowel wall thickening or dilatation. No evidence of obstruction. A normal appendix is visualized. Diffuse pancolonic edematous mural thickening with mucosal hyperemia and faint pericolonic hazy stranding. The colon is fluid-filled. Vascular/Lymphatic: Atherosclerotic plaque within the normal  caliber aorta. No suspicious or enlarged lymph nodes in the included lymphatic chains. Reproductive: Mild prostatomegaly. Seminal vesicles are unremarkable. Other: No bowel containing hernias. Evidence of prior right inguinal hernia repair. No free fluid or free air in the abdomen or pelvis. Faint pericolonic stranding, as detailed above. Musculoskeletal: Prior L4-S1 posterior spinal fusion and decompression no evidence of acute hardware complication. Chronic endplate changes L3 are similar to comparison exam. No acute or worrisome osseous lesions. Additional degenerative changes noted in the lower thoracic spine, both SI joints and hips. IMPRESSION: 1. Diffuse pancolonic edematous mural thickening with mucosal hyperemia and faint pericolonic hazy stranding, consistent with colitis of infectious or inflammatory etiology. 2. Mild gallbladder distention with borderline mural thickening but without visible calcified intraluminal or intraductal gallstones. If there is concern for acute cholecystitis, recommend further evaluation with right upper quadrant ultrasound. 3. Bilateral renal atrophy and faint perinephric stranding, similar to  comparison. 4. Stable bilateral adrenal adenomas. 5. Three-vessel coronary artery disease. 6. Aortic Atherosclerosis (ICD10-I70.0). 7. Prior L4-S1 posterior spinal fusion and decompression. Adjacent segment disease at L3-4 is similar to comparison. 8. Mild prostatomegaly. Electronically Signed   By: Lovena Le M.D.   On: 04/24/2019 20:52   DG Chest Port 1 View  Result Date: 04/24/2019 CLINICAL DATA:  Vomiting EXAM: PORTABLE CHEST 1 VIEW COMPARISON:  11/04/2018 FINDINGS: Heart and mediastinal contours are within normal limits. No focal opacities or effusions. No acute bony abnormality. Aortic atherosclerosis. IMPRESSION: No active disease. Electronically Signed   By: Rolm Baptise M.D.   On: 04/24/2019 18:35    Scheduled Meds: . aspirin EC  81 mg Oral Daily  . atorvastatin  40 mg  Oral q1800  . calcitRIOL  0.5 mcg Oral Q M,W,F-HD  . calcium acetate  1,334 mg Oral BID  . cephALEXin  500 mg Oral BID  . Chlorhexidine Gluconate Cloth  6 each Topical Q0600  . clopidogrel  75 mg Oral Daily  . clotrimazole   Topical BID  . cyclobenzaprine  10 mg Oral TID  . gabapentin  100 mg Oral BID  . heparin  5,000 Units Subcutaneous Q8H  . multivitamin  1 tablet Oral QHS  . mupirocin ointment   Topical Daily  . pantoprazole  40 mg Oral Daily    Continuous Infusions:   Flora Lipps, MD  [facility] 04/25/2019

## 2019-04-25 NOTE — Plan of Care (Signed)
  Problem: Education: Goal: Knowledge of General Education information will improve Description Including pain rating scale, medication(s)/side effects and non-pharmacologic comfort measures Outcome: Progressing   

## 2019-04-25 NOTE — Consult Note (Addendum)
[facility] Renal Consultation Note    Indication for Consultation:  Management of ESRD/hemodialysis, anemia, hypertension/volume, and secondary hyperparathyroidism.  HPI: Jon Anderson is a 69 y.o. male with a PMH including ESRD on dialysis, CAD, HTN, PAD, T2DM, who presented to the ED on 04/24/19 with chief complaint of diarrhea. Patient reports diarrhea started on 04/21/19. He has had some associated nausea, vomiting, and sweats. He denies any blood in the stool. Denies known sick contacts. He was started on keflex recently after a foot procedure, but diarrhea preceded antibiotics. In the ED, O2 sat 88%, BP 109/77, T 98.2. COVID test negative. WBC 8.6, Hgb 12.8, K+ 4.1 > 4.8, BUN 56, Cr 16.79 > 17.37 this AM. CT abdomen consistent with colitis, mild gallbladder distention, bilateral renal atrophy and adrenal adenomas. C diff negative, GI pathogen panel pending. He was admitted to observation for supportive care.   Patient dialyzes MWF at [facility]. Last HD was 04/21/19, missed last treatment due to diarrhea. He reports he always has some shortness of breath but has not noticed any increased dyspnea. O2 sat is currently 94% on 2L O2 via nasal cannula. Denies orthopnea, edema, PND. Denies CP, palpitations, dizziness, dysuria, and extremity pain. Denies any recent issues with dialysis.   Past Medical History:  Diagnosis Date  . Anemia of chronic disease   . CAD (coronary artery disease)    a.  Myoview 4/11: EF 53%, no scar or ischemia   c. MV 2012 Nl perfusion, apical thinning.  No ischemia or scar.  EF 49%, appears greater by visual estimate.;  d.  Dob stress echo 12/13:  Negative Dob stress echo. There is no evidence of ischemia.  The LVF is normal. b. Normal cors 2016.  . Carotid stenosis    a. <34% RICA, >19% LICA by duplex 06/7900  . Chronic chest pain    occ  . ESRD (end stage renal disease) on dialysis ([place])    M-W-F  . GERD (gastroesophageal reflux disease)   . HNP (herniated  nucleus pulposus), lumbar   . HTN (hypertension)    echo 3/10: EF 60%, LAE  . Hyperlipidemia   . Nephrolithiasis    "passed them all"  . Peripheral arterial disease ([place])    a. s/p PTCA to L SFA.  Marland Kitchen Pneumonia   . Restless legs   . Sleep apnea    no cpap, needs to reschedule appointment to set up aquiring cpap  . Snores    a. presumed OSA, pt has refused sleep eval in past.  . Type II diabetes mellitus ([place])    no longer on medications, checks blood glucose at home   Past Surgical History:  Procedure Laterality Date  . ANGIOPLASTY / STENTING FEMORAL Left 12/11/2013   dr berry  . AV FISTULA PLACEMENT Left 03/19/2014   Procedure: CREATION OF ARTERIOVENOUS (AV) FISTULA  LEFT UPPER ARM;  Surgeon: Mal Misty, MD;  Location: [place];  Service: Vascular;  Laterality: Left;  . CARDIAC CATHETERIZATION  2001 and 2010   . COLONOSCOPY W/ BIOPSIES AND POLYPECTOMY    . COLONOSCOPY WITH PROPOFOL N/A 08/01/2016   Procedure: COLONOSCOPY WITH PROPOFOL;  Surgeon: Carol Ada, MD;  Location: [facility];  Service: Endoscopy;  Laterality: N/A;  . CORONARY ARTERY BYPASS GRAFT  2018  . ESOPHAGOGASTRODUODENOSCOPY (EGD) WITH PROPOFOL N/A 08/01/2016   Procedure: ESOPHAGOGASTRODUODENOSCOPY (EGD) WITH PROPOFOL;  Surgeon: Carol Ada, MD;  Location: [facility];  Service: Endoscopy;  Laterality: N/A;  . FOOT FRACTURE SURGERY Right  ligament repair  . FRACTURE SURGERY    . INGUINAL HERNIA REPAIR Left   . LEFT HEART CATH AND CORS/GRAFTS ANGIOGRAPHY N/A 04/27/2017   Procedure: LEFT HEART CATH AND CORS/GRAFTS ANGIOGRAPHY;  Surgeon: Leonie Man, MD;  Location: [place] [facility];  Service: Cardiovascular;  Laterality: N/A;  . LEFT HEART CATHETERIZATION WITH CORONARY ANGIOGRAM N/A 06/22/2014   Procedure: LEFT HEART CATHETERIZATION WITH CORONARY ANGIOGRAM;  Surgeon: Troy Sine, MD;  Location: [facility] CATH LAB;  Service: Cardiovascular;  Laterality: N/A;  . LOWER EXTREMITY ANGIOGRAM Left 12/11/2013    Procedure: LOWER EXTREMITY ANGIOGRAM;  Surgeon: Lorretta Harp, MD;  Location: [facility] CATH LAB;  Service: Cardiovascular;  Laterality: Left;  . LUMBAR LAMINECTOMY/DECOMPRESSION MICRODISCECTOMY Right 07/03/2017   Procedure: MICRODISCECTOMY LUMBAR FIVE - SACRAL ONE RIGHT;  Surgeon: Consuella Lose, MD;  Location: [place];  Service: Neurosurgery;  Laterality: Right;  . LUMBAR LAMINECTOMY/DECOMPRESSION MICRODISCECTOMY Right 10/19/2017   Procedure: MICRODISCECTOMY LUMBAR FIVE- SACRAL 1 ONE ;  Surgeon: Consuella Lose, MD;  Location: [place];  Service: Neurosurgery;  Laterality: Right;  . TONSILLECTOMY AND ADENOIDECTOMY    . WISDOM TOOTH EXTRACTION     Family History  Problem Relation Age of Onset  . Heart attack Sister        died @ 15  . Cancer Mother        died @ 30; unknown type  . Diabetes Brother        deceased  . Cirrhosis Father        alcohol related  . Diabetes Father   . Esophageal cancer Neg Hx   . Colon cancer Neg Hx   . Pancreatic cancer Neg Hx   . Stomach cancer Neg Hx    Social History:  reports that he has quit smoking. His smoking use included cigarettes. He has a 2.00 pack-year smoking history. He has never used smokeless tobacco. He reports that he does not drink alcohol or use drugs.  ROS: As per HPI otherwise negative.  Review of Systems: Gen: Reports sweats and decreased appetite. Denies chills.  CV: Denies chest pain, angina, palpitations, orthopnea, PND, peripheral edema, and claudication. Resp:Reports chronic SOB, denies cough, sputum, or wheezing. GI: Reports vomiting, diarrhea. Denies abdominal pain.  GU: Denies dysuria. Derm: Denies rashes, itching, or ulcerations. Heme: Denies bruising, bleeding Neuro: No headache or dizziness  Physical Exam: Vitals:   04/25/19 0400 04/25/19 0415 04/25/19 0449 04/25/19 0907  BP: 105/65 95/65 129/72 112/62  Pulse: 97 92 94 81  Resp: (!) 23 11 12 18   Temp:   98.4 F (36.9 C) 97.8 F (36.6 C)  TempSrc:   Oral Oral   SpO2: 97% 99% 95% 94%  Weight:   72.5 kg      General: Well developed,chronically ill appearing male, in no acute distress. Head: Normocephalic, atraumatic, sclera non-icteric, mucus membranes are moist. Neck: JVD not elevated. Lungs: Clear bilaterally to auscultation without wheezes, rales, or rhonchi. Breathing is unlabored on O2 2L via nasal cannula. Heart: RRR with normal S1, S2. No murmurs, rubs, or gallops appreciated. Abdomen: Soft, non-tender, non-distended with mildly hyperactive bowel sounds. No rebound/guarding. No obvious abdominal masses. Musculoskeletal:  Strength and tone appear normal for age. Lower extremities: No edema bilateral lower extremities Neuro: Alert and oriented X 3. Moves all extremities spontaneously. No tremor/asterixis Psych:  Responds to questions appropriately with a normal affect. Dialysis Access: LUE AVF + bruit  Allergies  Allergen Reactions  . Kiwi Extract Itching, Swelling and Other (See Comments)  Lips and face swell- breathing not affected  . Flexeril [Cyclobenzaprine]     Hands become flimsy, can not hold things  . Other Other (See Comments)  . Tape Other (See Comments)    "Plastic" tape causes blisters!!   Prior to Admission medications   Medication Sig Start Date End Date Taking? Authorizing Provider  acetaminophen (TYLENOL) 500 MG tablet Take 1,000 mg by mouth every 4 (four) hours as needed for moderate pain or headache.    Yes [provider]  aspirin EC 81 MG tablet Take 1 tablet (81 mg total) by mouth daily. 05/28/12  Yes Weaver, Scott T, PA-C  atorvastatin (LIPITOR) 40 MG tablet Take 1 tablet (40 mg total) by mouth daily at 6 PM. 12/13/13  Yes Brett Canales, PA-C  calcium acetate (PHOSLO) 667 MG capsule Take 3 capsules (2,001 mg total) by mouth 3 (three) times daily with meals. Patient taking differently: Take 1,334 mg by mouth 2 (two) times daily.  10/09/15  Yes Short, Noah Delaine, MD  cephALEXin (KEFLEX) 500 MG capsule Take 1  capsule (500 mg total) by mouth 2 (two) times daily for 10 days. 04/22/19 05/02/19 Yes Marzetta Board, DPM  clopidogrel (PLAVIX) 75 MG tablet Take 75 mg by mouth daily. 07/29/18  Yes [provider]  clotrimazole (LOTRIMIN) 1 % cream Apply between toes and under toes twice daily for 4 weeks. 09/17/18  Yes Marzetta Board, DPM  gabapentin (NEURONTIN) 100 MG capsule Take 100 mg by mouth 2 (two) times daily.    Yes [provider]  loperamide (IMODIUM) 2 MG capsule Take 2 mg by mouth as needed for diarrhea or loose stools.   Yes [provider]  multivitamin (RENA-VIT) TABS tablet Take 1 tablet by mouth at bedtime. 06/23/14  Yes Domenic Polite, MD  mupirocin ointment (BACTROBAN) 2 % Apply to right foot once daily. 04/22/19 04/21/20 Yes Galaway, Stephani Police, DPM  pantoprazole (PROTONIX) 40 MG tablet Take 40 mg by mouth daily.   Yes [provider]  polyethylene glycol (MIRALAX / GLYCOLAX) packet Take 17 g by mouth daily as needed for moderate constipation. 03/22/17  Yes Aline August, MD  amLODipine (NORVASC) 5 MG tablet Take 1 tablet (5 mg total) by mouth daily for 30 days. 07/20/18 08/23/18  Elodia Florence., MD  cyclobenzaprine (FLEXERIL) 10 MG tablet Take 10 mg by mouth 3 (three) times daily. 07/31/18   [provider]  HYDROcodone-acetaminophen (NORCO/VICODIN) 5-325 MG tablet Take 1 tablet by mouth every 4 (four) hours as needed. 06/14/18   Tacy Learn, PA-C  nitroGLYCERIN (NITROSTAT) 0.4 MG SL tablet Place 1 tablet (0.4 mg total) under the tongue every 5 (five) minutes as needed for chest pain. 03/20/12   Barrett, Evelene Croon, PA-C  oxyCODONE (OXY IR/ROXICODONE) 5 MG immediate release tablet Take 5 mg by mouth every 6 (six) hours as needed for moderate pain.  08/28/18   [provider]  tamsulosin (FLOMAX) 0.4 MG CAPS capsule Take 0.4 mg by mouth daily. 05/23/18   [provider]   Current Facility-Administered Medications   Medication Dose Route Frequency Provider Last Rate Last Admin  . aspirin EC tablet 81 mg  81 mg Oral Daily Dana Allan I, MD      . atorvastatin (LIPITOR) tablet 40 mg  40 mg Oral q1800 Dana Allan I, MD      . calcium acetate (PHOSLO) capsule 1,334 mg  1,334 mg Oral BID Bonnell Public, MD   1,334 mg at  04/24/19 2324  . cephALEXin (KEFLEX) capsule 500 mg  500 mg Oral BID Dana Allan I, MD   500 mg at 04/24/19 2322  . Chlorhexidine Gluconate Cloth 2 % PADS 6 each  6 each Topical Q0600 Collins, Samantha G, PA-C      . clopidogrel (PLAVIX) tablet 75 mg  75 mg Oral Daily Dana Allan I, MD      . clotrimazole (LOTRIMIN) 1 % cream   Topical BID Bonnell Public, MD   Given at 04/25/19 0029  . cyclobenzaprine (FLEXERIL) tablet 10 mg  10 mg Oral TID Dana Allan I, MD   10 mg at 04/24/19 2322  . gabapentin (NEURONTIN) capsule 100 mg  100 mg Oral BID Dana Allan I, MD   100 mg at 04/24/19 2323  . heparin injection 5,000 Units  5,000 Units Subcutaneous Q8H Dana Allan I, MD   5,000 Units at 04/25/19 [phone]  . multivitamin (RENA-VIT) tablet 1 tablet  1 tablet Oral QHS Dana Allan I, MD   1 tablet at 04/24/19 2325  . mupirocin ointment (BACTROBAN) 2 %   Topical Daily Dana Allan I, MD      . oxyCODONE (Oxy IR/ROXICODONE) immediate release tablet 5 mg  5 mg Oral Q6H PRN Dana Allan I, MD      . pantoprazole (PROTONIX) EC tablet 40 mg  40 mg Oral Daily Bonnell Public, MD       Labs: Basic Metabolic Panel: Recent Labs  Lab 04/24/19 1654 04/25/19 0042 04/25/19 0258  NA 138  --  134*  K 4.1  --  4.8  CL 89*  --  87*  CO2 25  --  28  GLUCOSE 153*  --  95  BUN 56*  --  59*  CREATININE 16.77* 16.79* 17.37*  CALCIUM 8.6*  --  8.2*  PHOS 5.9*  --   --    Liver Function Tests: Recent Labs  Lab 04/24/19 1654  AST 12*  ALT 8  ALKPHOS 81  BILITOT 0.6  PROT 7.7  ALBUMIN 3.2*   CBC: Recent Labs  Lab 04/24/19 1654  04/25/19 0042 04/25/19 0258  WBC 9.1 8.6 9.7  NEUTROABS 6.7  --   --   HGB 13.8 12.8* 13.2  HCT 42.8 41.1 41.4  MCV 94.5 96.9 95.0  PLT 237 228 235   CBG: Recent Labs  Lab 04/25/19 0442 04/25/19 0726  GLUCAP 82 83   Studies/Results: CT ABDOMEN PELVIS W CONTRAST  Result Date: 04/24/2019 CLINICAL DATA:  Acute generalized abdominal pain with neutropenia, missed Wednesday dialysis EXAM: CT ABDOMEN AND PELVIS WITH CONTRAST TECHNIQUE: Multidetector CT imaging of the abdomen and pelvis was performed using the standard protocol following bolus administration of intravenous contrast. CONTRAST:  100 mL Omnipaque 300 COMPARISON:  CT abdomen pelvis 07/17/2018 FINDINGS: Lower chest: Atelectatic change present the right lung base. Cardiac size within normal limits. No pericardial effusion. Dense calcification of mitral annulus is noted. Calcifications also present on the aortic valve leaflets. Three-vessel coronary artery disease is present. Bilateral mild gynecomastia is noted. Hepatobiliary: Hypoattenuating focus along the anterior segment 4 of the liver, could reflect focal fatty infiltration or a benign process such as liver cyst or ciliated foregut cyst. No worrisome hepatic lesions. Mild gallbladder distention with borderline mural thickening but without visible calcified intraluminal or intraductal gallstones. No biliary ductal dilatation. Pancreas: Unremarkable. No pancreatic ductal dilatation or surrounding inflammatory changes. Spleen: Normal in size without focal abnormality. Adrenals/Urinary Tract: Stable appearance of the bilateral nodular adrenal  glands largest measuring up to 0.7 cm in the lateral limb of the right adrenal gland. These have been previously characterized as adrenal adenomas. There is extensive bilateral renal atrophy. No worrisome renal lesions. Mild bilateral symmetric perinephric stranding, a nonspecific finding in the setting of dialysis dependence. No urolithiasis or  hydronephrosis. Urinary bladder is largely decompressed at the time of exam and therefore poorly evaluated by CT imaging. Stomach/Bowel: Distal esophagus, stomach and duodenal sweep are unremarkable. No small bowel wall thickening or dilatation. No evidence of obstruction. A normal appendix is visualized. Diffuse pancolonic edematous mural thickening with mucosal hyperemia and faint pericolonic hazy stranding. The colon is fluid-filled. Vascular/Lymphatic: Atherosclerotic plaque within the normal caliber aorta. No suspicious or enlarged lymph nodes in the included lymphatic chains. Reproductive: Mild prostatomegaly. Seminal vesicles are unremarkable. Other: No bowel containing hernias. Evidence of prior right inguinal hernia repair. No free fluid or free air in the abdomen or pelvis. Faint pericolonic stranding, as detailed above. Musculoskeletal: Prior L4-S1 posterior spinal fusion and decompression no evidence of acute hardware complication. Chronic endplate changes L3 are similar to comparison exam. No acute or worrisome osseous lesions. Additional degenerative changes noted in the lower thoracic spine, both SI joints and hips. IMPRESSION: 1. Diffuse pancolonic edematous mural thickening with mucosal hyperemia and faint pericolonic hazy stranding, consistent with colitis of infectious or inflammatory etiology. 2. Mild gallbladder distention with borderline mural thickening but without visible calcified intraluminal or intraductal gallstones. If there is concern for acute cholecystitis, recommend further evaluation with right upper quadrant ultrasound. 3. Bilateral renal atrophy and faint perinephric stranding, similar to comparison. 4. Stable bilateral adrenal adenomas. 5. Three-vessel coronary artery disease. 6. Aortic Atherosclerosis (ICD10-I70.0). 7. Prior L4-S1 posterior spinal fusion and decompression. Adjacent segment disease at L3-4 is similar to comparison. 8. Mild prostatomegaly. Electronically Signed    By: Lovena Le M.D.   On: 04/24/2019 20:52   DG Chest Port 1 View  Result Date: 04/24/2019 CLINICAL DATA:  Vomiting EXAM: PORTABLE CHEST 1 VIEW COMPARISON:  11/04/2018 FINDINGS: Heart and mediastinal contours are within normal limits. No focal opacities or effusions. No acute bony abnormality. Aortic atherosclerosis. IMPRESSION: No active disease. Electronically Signed   By: Rolm Baptise M.D.   On: 04/24/2019 18:35    Dialysis Orders: Center: [facility] on MWF. Time: 4 hours, 180 NRe, BFR 450, DFR 800, EDW 74kg, 2K/2Ca, UF Profile 2, LUE AVF Heparin 5000 unit bolus Calcitriol 0.110mcg PO q HD Parsabiv 2.5 mg IV post HD Calcium acetate 3 tabs PO AC  Assessment/Plan: 1.  Diarrhea: COVID 19 and C diff negative. CT scan consistent with colitis. Admitted to observation for supportive care. Per primary team. 2.  ESRD:  Dialyzes MWF, missed last HD due to diarrhea. Creatinine 17.37, K+ 4.8. Not in acute distress, no confusion or asterixis on exam. Plan HD today per regular schedule. 3 hour treatment due to high census. Follow creatinine in AM.  3.  Hypertension/volume: Hypotensive on arrival, received 529mL lactated ringers, BP now controlled. No evidence of volume overload on exam or CT abdomen. Patient is 1.5kg below his EDW. Minimal UF with HD.  4.  Anemia: Hgb 13.2. No indication for ESA/IV Fe.  5.  Metabolic bone disease: Calcium 8.2. Continue calcitriol. Parsabiv not on formulary, resume outpatient. Currently NPO, resume binders once eating.  6.  Nutrition:  NPO, per primary. Recommend renal diet/fluid restrictions once tolerating PO.   Anice Paganini, PA-C 04/25/2019, 9:27 AM  [facility] Pager: [phone]  Pt seen, examined and agree w A/P as above.  Kelly Splinter  MD 04/25/2019, 3:12 PM

## 2019-04-26 DIAGNOSIS — R1110 Vomiting, unspecified: Secondary | ICD-10-CM

## 2019-04-26 LAB — CBC
HCT: 36.2 % — ABNORMAL LOW (ref 39.0–52.0)
Hemoglobin: 11.4 g/dL — ABNORMAL LOW (ref 13.0–17.0)
MCH: 29.8 pg (ref 26.0–34.0)
MCHC: 31.5 g/dL (ref 30.0–36.0)
MCV: 94.8 fL (ref 80.0–100.0)
Platelets: 233 10*3/uL (ref 150–400)
RBC: 3.82 MIL/uL — ABNORMAL LOW (ref 4.22–5.81)
RDW: 13.9 % (ref 11.5–15.5)
WBC: 6.3 10*3/uL (ref 4.0–10.5)
nRBC: 0 % (ref 0.0–0.2)

## 2019-04-26 LAB — GLUCOSE, CAPILLARY
Glucose-Capillary: 103 mg/dL — ABNORMAL HIGH (ref 70–99)
Glucose-Capillary: 112 mg/dL — ABNORMAL HIGH (ref 70–99)
Glucose-Capillary: 64 mg/dL — ABNORMAL LOW (ref 70–99)
Glucose-Capillary: 71 mg/dL (ref 70–99)
Glucose-Capillary: 84 mg/dL (ref 70–99)
Glucose-Capillary: 91 mg/dL (ref 70–99)

## 2019-04-26 LAB — BASIC METABOLIC PANEL
Anion gap: 14 (ref 5–15)
BUN: 30 mg/dL — ABNORMAL HIGH (ref 8–23)
CO2: 29 mmol/L (ref 22–32)
Calcium: 8.1 mg/dL — ABNORMAL LOW (ref 8.9–10.3)
Chloride: 91 mmol/L — ABNORMAL LOW (ref 98–111)
Creatinine, Ser: 11.17 mg/dL — ABNORMAL HIGH (ref 0.61–1.24)
GFR calc Af Amer: 5 mL/min — ABNORMAL LOW (ref >60)
GFR calc non Af Amer: 4 mL/min — ABNORMAL LOW (ref >60)
Glucose, Bld: 86 mg/dL (ref 70–99)
Potassium: 4 mmol/L (ref 3.5–5.1)
Sodium: 134 mmol/L — ABNORMAL LOW (ref 135–145)

## 2019-04-26 LAB — MAGNESIUM: Magnesium: 1.8 mg/dL (ref 1.7–2.4)

## 2019-04-26 MED ORDER — MIDODRINE HCL 5 MG PO TABS: 5.0000 mg | ORAL_TABLET | ORAL | Status: DC

## 2019-04-26 MED ORDER — CALCIUM ACETATE (PHOS BINDER) 667 MG PO CAPS
2001.0000 mg | ORAL_CAPSULE | Freq: Three times a day (TID) | ORAL | Status: DC
  Administered 2019-04-26: 2001 mg via ORAL
  Filled 2019-04-26: qty 3

## 2019-04-26 MED ORDER — MIDODRINE HCL 5 MG PO TABS: 5.0000 mg | ORAL_TABLET | ORAL | 2 refills | Status: AC

## 2019-04-26 NOTE — Discharge Summary (Signed)
Physician Discharge Summary  Jon Anderson PJK:[id number] DOB: [birth year] DOA: 04/24/2019  PCP: [name], MD  Admit date: 04/24/2019 Discharge date: 04/26/2019  Admitted From: Home  Discharge disposition: home   Recommendations for Outpatient Follow-Up:    Follow up with your primary care provider in one week.    Discharge Diagnosis:   Active Problems:   Gastroenteritis    Discharge Condition: Improved.  Diet recommendation: Low sodium, heart healthy.     Wound care: None.  Code status: Full.   History of Present Illness:   Patient is a 69 year old African-American male with past medical history significant for end-stage renal disease on hemodialysis on Monday, Wednesday and Friday; diabetes mellitus type 2, sleep apnea, peripheral artery disease, hypertension, hyperlipidemia, GERD, coronary artery disease, carotid stenosis and anemia of chronic kidney disease presented to hospital with nausea, vomiting and diarrhea.Apparently, patient had missed his dialysis x1.Patient's problem started about 4 days prior to presentation ago with diarrhea. Patient reported 3-5 episodes of watery stool. Patient developed nausea and vomiting1 to 2 days afterwards, but main problemremained diarrhea.Patient continued to have diarrhea stools. ED Course:On presentation to the ER, temperature was 98.2, heart rate of 66 to 110 bpm, respiratory rate of 17, blood pressure 109/77 with O2 sat of 88%.  CBC revealed WBC of 8.6, hemoglobin of 12.8, hematocrit of 41.1 with platelet count of 328. Sodium was 138, potassium of 4.1, chloride of 89, CO2 of 25, BUN of 56 with creatinine of 16.79.  Patient was then considered for admission to the hospital   Hospital Course:  Following conditions were addressed during hospitalization as listed below,  Acute gastroenteritis: Improved at this time.  His nausea has improved.  Has not had a bowel movement since yesterday.  He was on clears  yesterday and has been advised to regular diet which he has tolerated. GI pathogen panel was sent and pending at the time of discharge but  C. difficile was negative.  Continue to have any episodes of diarrhea after coming to the hospital and his symptoms improved with symptomatic treatment.   ESRD on hemodialysis Monday Wednesday Friday: Nephrology  saw the patient during hospitalization and patient underwent hemodialysis.  Essential hypertension: -Had episode of hypotension so midodrine was initiated by nephrology on hemodialysis days.  Patient stated that he does not take amlodipine.  Will prescribe midodrine on discharge.  Right foot pain/recent surgery.  On Keflex which will be continued.  CT was negative.  Coronary artery disease/MI: Stable at this time.  Continue aspirin, Plavix.  Disposition.  At this time, patient is stable for disposition home.  Medical Consultants:    None.  Procedures:    None Subjective:   Today, patient feels okay.  Has tolerated p.o. diet.  No nausea vomiting or diarrhea.  Mild foot pain.  Discharge Exam:   Vitals:   04/26/19 0917 04/26/19 1121  BP: (!) 87/58 (!) 92/57  Pulse: 83 76  Resp: 18 18  Temp: 97.7 F (36.5 C)   SpO2: 99% 100%   Vitals:   04/25/19 2032 04/26/19 0450 04/26/19 0917 04/26/19 1121  BP: (!) 94/58 (!) 95/52 (!) 87/58 (!) 92/57  Pulse: 86 77 83 76  Resp: 12 16 18 18   Temp: 97.8 F (36.6 C) 98.2 F (36.8 C) 97.7 F (36.5 C)   TempSrc: Oral Oral Oral   SpO2: 100% 99% 99% 100%  Weight: 76.5 kg      General: Alert awake, not in obvious distress HENT: pupils equally reacting  to light and accommodation.  No scleral pallor or icterus noted. Oral mucosa is moist.  Chest:  Clear breath sounds.  Diminished breath sounds bilaterally. No crackles or wheezes.  CVS: S1 &S2 heard. No murmur.  Regular rate and rhythm. Abdomen: Soft, nontender, nondistended.  Bowel sounds are heard.   Extremities: No cyanosis, clubbing or  edema.  Peripheral pulses are palpable.  Right foot plantar aspect with area of surgery .  No erythema or induration. Psych: Alert, awake and oriented, normal mood CNS:  No cranial nerve deficits.  Power equal in all extremities.   Skin: Warm and dry.   The results of significant diagnostics from this hospitalization (including imaging, microbiology, ancillary and laboratory) are listed below for reference.     Diagnostic Studies:   CT ABDOMEN PELVIS W CONTRAST  Result Date: 04/24/2019 CLINICAL DATA:  Acute generalized abdominal pain with neutropenia, missed Wednesday dialysis EXAM: CT ABDOMEN AND PELVIS WITH CONTRAST TECHNIQUE: Multidetector CT imaging of the abdomen and pelvis was performed using the standard protocol following bolus administration of intravenous contrast. CONTRAST:  100 mL Omnipaque 300 COMPARISON:  CT abdomen pelvis 07/17/2018 FINDINGS: Lower chest: Atelectatic change present the right lung base. Cardiac size within normal limits. No pericardial effusion. Dense calcification of mitral annulus is noted. Calcifications also present on the aortic valve leaflets. Three-vessel coronary artery disease is present. Bilateral mild gynecomastia is noted. Hepatobiliary: Hypoattenuating focus along the anterior segment 4 of the liver, could reflect focal fatty infiltration or a benign process such as liver cyst or ciliated foregut cyst. No worrisome hepatic lesions. Mild gallbladder distention with borderline mural thickening but without visible calcified intraluminal or intraductal gallstones. No biliary ductal dilatation. Pancreas: Unremarkable. No pancreatic ductal dilatation or surrounding inflammatory changes. Spleen: Normal in size without focal abnormality. Adrenals/Urinary Tract: Stable appearance of the bilateral nodular adrenal glands largest measuring up to 0.7 cm in the lateral limb of the right adrenal gland. These have been previously characterized as adrenal adenomas. There is  extensive bilateral renal atrophy. No worrisome renal lesions. Mild bilateral symmetric perinephric stranding, a nonspecific finding in the setting of dialysis dependence. No urolithiasis or hydronephrosis. Urinary bladder is largely decompressed at the time of exam and therefore poorly evaluated by CT imaging. Stomach/Bowel: Distal esophagus, stomach and duodenal sweep are unremarkable. No small bowel wall thickening or dilatation. No evidence of obstruction. A normal appendix is visualized. Diffuse pancolonic edematous mural thickening with mucosal hyperemia and faint pericolonic hazy stranding. The colon is fluid-filled. Vascular/Lymphatic: Atherosclerotic plaque within the normal caliber aorta. No suspicious or enlarged lymph nodes in the included lymphatic chains. Reproductive: Mild prostatomegaly. Seminal vesicles are unremarkable. Other: No bowel containing hernias. Evidence of prior right inguinal hernia repair. No free fluid or free air in the abdomen or pelvis. Faint pericolonic stranding, as detailed above. Musculoskeletal: Prior L4-S1 posterior spinal fusion and decompression no evidence of acute hardware complication. Chronic endplate changes L3 are similar to comparison exam. No acute or worrisome osseous lesions. Additional degenerative changes noted in the lower thoracic spine, both SI joints and hips. IMPRESSION: 1. Diffuse pancolonic edematous mural thickening with mucosal hyperemia and faint pericolonic hazy stranding, consistent with colitis of infectious or inflammatory etiology. 2. Mild gallbladder distention with borderline mural thickening but without visible calcified intraluminal or intraductal gallstones. If there is concern for acute cholecystitis, recommend further evaluation with right upper quadrant ultrasound. 3. Bilateral renal atrophy and faint perinephric stranding, similar to comparison. 4. Stable bilateral adrenal adenomas. 5. Three-vessel coronary artery  disease. 6. Aortic  Atherosclerosis (ICD10-I70.0). 7. Prior L4-S1 posterior spinal fusion and decompression. Adjacent segment disease at L3-4 is similar to comparison. 8. Mild prostatomegaly. Electronically Signed   By: Lovena Le M.D.   On: 04/24/2019 20:52   DG Chest Port 1 View  Result Date: 04/24/2019 CLINICAL DATA:  Vomiting EXAM: PORTABLE CHEST 1 VIEW COMPARISON:  11/04/2018 FINDINGS: Heart and mediastinal contours are within normal limits. No focal opacities or effusions. No acute bony abnormality. Aortic atherosclerosis. IMPRESSION: No active disease. Electronically Signed   By: Rolm Baptise M.D.   On: 04/24/2019 18:35     Labs:   Basic Metabolic Panel: Recent Labs  Lab 04/24/19 1654 04/24/19 1654 04/25/19 0042 04/25/19 0258 04/26/19 0350  NA 138  --   --  134* 134*  K 4.1   < >  --  4.8 4.0  CL 89*  --   --  87* 91*  CO2 25  --   --  28 29  GLUCOSE 153*  --   --  95 86  BUN 56*  --   --  59* 30*  CREATININE 16.77*  --  16.79* 17.37* 11.17*  CALCIUM 8.6*  --   --  8.2* 8.1*  MG 1.8  --   --   --  1.8  PHOS 5.9*  --   --   --   --    < > = values in this interval not displayed.   GFR Estimated Creatinine Clearance: 6.6 mL/min (A) (by C-G formula based on SCr of 11.17 mg/dL (H)). Liver Function Tests: Recent Labs  Lab 04/24/19 1654  AST 12*  ALT 8  ALKPHOS 81  BILITOT 0.6  PROT 7.7  ALBUMIN 3.2*   No results for input(s): LIPASE, AMYLASE in the last 168 hours. No results for input(s): AMMONIA in the last 168 hours. Coagulation profile No results for input(s): INR, PROTIME in the last 168 hours.  CBC: Recent Labs  Lab 04/24/19 1654 04/25/19 0042 04/25/19 0258 04/26/19 0350  WBC 9.1 8.6 9.7 6.3  NEUTROABS 6.7  --   --   --   HGB 13.8 12.8* 13.2 11.4*  HCT 42.8 41.1 41.4 36.2*  MCV 94.5 96.9 95.0 94.8  PLT 237 228 235 233   Cardiac Enzymes: No results for input(s): CKTOTAL, CKMB, CKMBINDEX, TROPONINI in the last 168 hours. BNP: Invalid input(s): POCBNP CBG: Recent  Labs  Lab 04/26/19 0003 04/26/19 0034 04/26/19 0413 04/26/19 0725 04/26/19 1128  GLUCAP 64* 71 91 84 112*   D-Dimer No results for input(s): DDIMER in the last 72 hours. Hgb A1c No results for input(s): HGBA1C in the last 72 hours. Lipid Profile No results for input(s): CHOL, HDL, LDLCALC, TRIG, CHOLHDL, LDLDIRECT in the last 72 hours. Thyroid function studies No results for input(s): TSH, T4TOTAL, T3FREE, THYROIDAB in the last 72 hours.  Invalid input(s): FREET3 Anemia work up No results for input(s): VITAMINB12, FOLATE, FERRITIN, TIBC, IRON, RETICCTPCT in the last 72 hours. Microbiology Recent Results (from the past 240 hour(s))  SARS CORONAVIRUS 2 (TAT 6-24 HRS) Nasopharyngeal Nasopharyngeal Swab     Status: None   Collection Time: 04/24/19  9:35 PM   Specimen: Nasopharyngeal Swab  Result Value Ref Range Status   SARS Coronavirus 2 NEGATIVE NEGATIVE Final    Comment: (NOTE) SARS-CoV-2 target nucleic acids are NOT DETECTED. The SARS-CoV-2 RNA is generally detectable in upper and lower respiratory specimens during the acute phase of infection. Negative results do not preclude SARS-CoV-2  infection, do not rule out co-infections with other pathogens, and should not be used as the sole basis for treatment or other patient management decisions. Negative results must be combined with clinical observations, patient history, and epidemiological information. The expected result is Negative. Fact Sheet for Patients: SugarRoll.be Fact Sheet for Healthcare Providers: https://www.woods-mathews.com/ This test is not yet approved or cleared by the [place] FDA and  has been authorized for detection and/or diagnosis of SARS-CoV-2 by FDA under an Emergency Use Authorization (EUA). This EUA will remain  in effect (meaning this test can be used) for the duration of the COVID-19 declaration under Section 56 4(b)(1) of the Act, 21  U.S.C. section 360bbb-3(b)(1), unless the authorization is terminated or revoked sooner. Performed at [facility], [place] [address], [place], [place] [address]   C difficile quick scan w PCR reflex     Status: None   Collection Time: 04/25/19  5:16 AM   Specimen: Stool  Result Value Ref Range Status   C Diff antigen NEGATIVE NEGATIVE Final   C Diff toxin NEGATIVE NEGATIVE Final   C Diff interpretation No C. difficile detected.  Final    Comment: Performed at [facility], [place] [address] [place], [place] [address]  MRSA PCR Screening     Status: None   Collection Time: 04/25/19  6:11 AM   Specimen: Nasal Mucosa; Nasopharyngeal  Result Value Ref Range Status   MRSA by PCR NEGATIVE NEGATIVE Final    Comment:        The GeneXpert MRSA Assay (FDA approved for NASAL specimens only), is one component of a comprehensive MRSA colonization surveillance program. It is not intended to diagnose MRSA infection nor to guide or monitor treatment for MRSA infections. Performed at [facility], [place] [address] [place], [place] [address]   SARS CORONAVIRUS 2 (TAT 6-24 HRS)     Status: None   Collection Time: 04/25/19  7:00 AM  Result Value Ref Range Status   SARS Coronavirus 2 NEGATIVE NEGATIVE Final    Comment: (NOTE) SARS-CoV-2 target nucleic acids are NOT DETECTED. The SARS-CoV-2 RNA is generally detectable in upper and lower respiratory specimens during the acute phase of infection. Negative results do not preclude SARS-CoV-2 infection, do not rule out co-infections with other pathogens, and should not be used as the sole basis for treatment or other patient management decisions. Negative results must be combined with clinical observations, patient history, and epidemiological information. The expected result is Negative. Fact Sheet for Patients: SugarRoll.be Fact Sheet for Healthcare  Providers: https://www.woods-mathews.com/ This test is not yet approved or cleared by the [place] FDA and  has been authorized for detection and/or diagnosis of SARS-CoV-2 by FDA under an Emergency Use Authorization (EUA). This EUA will remain  in effect (meaning this test can be used) for the duration of the COVID-19 declaration under Section 56 4(b)(1) of the Act, 21 U.S.C. section 360bbb-3(b)(1), unless the authorization is terminated or revoked sooner. Performed at [facility], [place] [address], [place], [place] [address]      Discharge Instructions:   Discharge Instructions    Diet - low sodium heart healthy   Complete by: As directed    Discharge instructions   Complete by: As directed    Take ProAmatine on days of dialysis. Seek medical attention for worsening symptoms. Follow up with your primary care physician in 1 week for regular follow-up.  Continue hemodialysis as outpatient   Increase activity  slowly   Complete by: As directed      Allergies as of 04/26/2019      Reactions   Kiwi Extract Itching, Swelling, Other (See Comments)   Lips and face swell- breathing not affected   Flexeril [cyclobenzaprine]    Hands become flimsy, can not hold things   Other Other (See Comments)   Tape Other (See Comments)   "Plastic" tape causes blisters!!      Medication List    STOP taking these medications   amLODipine 5 MG tablet Commonly known as: NORVASC     TAKE these medications   acetaminophen 500 MG tablet Commonly known as: TYLENOL Take 1,000 mg by mouth every 4 (four) hours as needed for moderate pain or headache.   aspirin EC 81 MG tablet Take 1 tablet (81 mg total) by mouth daily.   atorvastatin 40 MG tablet Commonly known as: LIPITOR Take 1 tablet (40 mg total) by mouth daily at 6 PM.   calcium acetate 667 MG capsule Commonly known as: PHOSLO Take 3 capsules (2,001 mg total) by mouth 3 (three) times daily with meals. What changed:    how much to take  when to take this   cephALEXin 500 MG capsule Commonly known as: Keflex Take 1 capsule (500 mg total) by mouth 2 (two) times daily for 10 days.   clopidogrel 75 MG tablet Commonly known as: PLAVIX Take 75 mg by mouth daily.   clotrimazole 1 % cream Commonly known as: LOTRIMIN Apply between toes and under toes twice daily for 4 weeks.   cyclobenzaprine 10 MG tablet Commonly known as: FLEXERIL Take 10 mg by mouth 3 (three) times daily.   gabapentin 100 MG capsule Commonly known as: NEURONTIN Take 100 mg by mouth 2 (two) times daily.   HYDROcodone-acetaminophen 5-325 MG tablet Commonly known as: NORCO/VICODIN Take 1 tablet by mouth every 4 (four) hours as needed.   loperamide 2 MG capsule Commonly known as: IMODIUM Take 2 mg by mouth as needed for diarrhea or loose stools.   midodrine 5 MG tablet Commonly known as: PROAMATINE Take 1 tablet (5 mg total) by mouth every Monday, Wednesday, and Friday with hemodialysis. Start taking on: April 28, 2019   multivitamin Tabs tablet Take 1 tablet by mouth at bedtime.   mupirocin ointment 2 % Commonly known as: BACTROBAN Apply to right foot once daily.   nitroGLYCERIN 0.4 MG SL tablet Commonly known as: Nitrostat Place 1 tablet (0.4 mg total) under the tongue every 5 (five) minutes as needed for chest pain.   oxyCODONE 5 MG immediate release tablet Commonly known as: Oxy IR/ROXICODONE Take 5 mg by mouth every 6 (six) hours as needed for moderate pain.   pantoprazole 40 MG tablet Commonly known as: PROTONIX Take 40 mg by mouth daily.   polyethylene glycol 17 g packet Commonly known as: MIRALAX / GLYCOLAX Take 17 g by mouth daily as needed for moderate constipation.   tamsulosin 0.4 MG Caps capsule Commonly known as: FLOMAX Take 0.4 mg by mouth daily.       Time coordinating discharge: 39 minutes  Signed:  Mizraim Harmening  [facility] 04/26/2019, 2:15 PM

## 2019-04-26 NOTE — Plan of Care (Signed)
See d/c note

## 2019-04-26 NOTE — Progress Notes (Signed)
DISCHARGE NOTE HOME DALTYN DEGROAT to be discharged to home per MD order. Discussed prescriptions and follow up appointments with the patient. Prescriptions given to patient; medication list explained in detail. Patient verbalized understanding.  Skin clean, dry and intact without evidence of skin break down, no evidence of skin tears noted. IV catheter discontinued intact. Site without signs and symptoms of complications. Dressing and pressure applied. Pt denies pain at the site currently. No complaints noted.  Patient free of lines, drains, and wounds.   An After Visit Summary (AVS) was printed and given to the patient. Patient escorted via wheelchair, and discharged home via private auto.  [place], Zenon Mayo, RN

## 2019-04-26 NOTE — Progress Notes (Addendum)
[facility] Progress Note   Subjective:   Patient seen in room. Diarrhea improved today, still with some nausea. Tolerating liquids so far. Denies SOB, orthopnea, CP, palpitations, dizziness. Did not tolerate UF with HD yesterday, BP in the 85'I systolic throughout most of the treatment. Pt reports his BP has been dropping with outpatient HD lately as well. Not on BP meds.   Objective Vitals:   04/25/19 2032 04/26/19 0450 04/26/19 0917 04/26/19 1121  BP: (!) 94/58 (!) 95/52 (!) 87/58 (!) 92/57  Pulse: 86 77 83 76  Resp: 12 16 18 18   Temp: 97.8 F (36.6 C) 98.2 F (36.8 C) 97.7 F (36.5 C)   TempSrc: Oral Oral Oral   SpO2: 100% 99% 99% 100%  Weight: 76.5 kg      Physical Exam General: Well developed, well nourished male. Alert and in NAD Heart: RRR, no murmurs, rubs or gallops Lungs: Occasional crackles RLL No wheezing auscultated. Respirations unlabored on O2 via nasal cannula Abdomen: Soft, non-tender, +BS. No rebound tenderness or gaurding Extremities: No edema b/l lower extremities Dialysis Access: LUE AVF + bruit  Additional Objective Labs: Basic Metabolic Panel: Recent Labs  Lab 04/24/19 1654 04/24/19 1654 04/25/19 0042 04/25/19 0258 04/26/19 0350  NA 138  --   --  134* 134*  K 4.1  --   --  4.8 4.0  CL 89*  --   --  87* 91*  CO2 25  --   --  28 29  GLUCOSE 153*  --   --  95 86  BUN 56*  --   --  59* 30*  CREATININE 16.77*   < > 16.79* 17.37* 11.17*  CALCIUM 8.6*  --   --  8.2* 8.1*  PHOS 5.9*  --   --   --   --    < > = values in this interval not displayed.   Liver Function Tests: Recent Labs  Lab 04/24/19 1654  AST 12*  ALT 8  ALKPHOS 81  BILITOT 0.6  PROT 7.7  ALBUMIN 3.2*   CBC: Recent Labs  Lab 04/24/19 1654 04/24/19 1654 04/25/19 0042 04/25/19 0258 04/26/19 0350  WBC 9.1   < > 8.6 9.7 6.3  NEUTROABS 6.7  --   --   --   --   HGB 13.8   < > 12.8* 13.2 11.4*  HCT 42.8   < > 41.1 41.4 36.2*  MCV 94.5  --  96.9 95.0 94.8   PLT 237   < > 228 235 233   < > = values in this interval not displayed.   Blood Culture    Component Value Date/Time   SDES BLOOD RIGHT ARM 04/23/2018 0222   SPECREQUEST  04/23/2018 0222    BOTTLES DRAWN AEROBIC AND ANAEROBIC Blood Culture adequate volume   CULT  04/23/2018 0222    NO GROWTH 5 DAYS Performed at [facility], [place] [address] [place], [place] [address]    REPTSTATUS 04/28/2018 FINAL 04/23/2018 0222    CBG: Recent Labs  Lab 04/25/19 2029 04/26/19 0003 04/26/19 0034 04/26/19 0413 04/26/19 0725  GLUCAP 96 64* 71 91 84    Studies/Results: CT ABDOMEN PELVIS W CONTRAST  Result Date: 04/24/2019 CLINICAL DATA:  Acute generalized abdominal pain with neutropenia, missed Wednesday dialysis EXAM: CT ABDOMEN AND PELVIS WITH CONTRAST TECHNIQUE: Multidetector CT imaging of the abdomen and pelvis was performed using the standard protocol following bolus administration of intravenous contrast. CONTRAST:  100 mL Omnipaque  300 COMPARISON:  CT abdomen pelvis 07/17/2018 FINDINGS: Lower chest: Atelectatic change present the right lung base. Cardiac size within normal limits. No pericardial effusion. Dense calcification of mitral annulus is noted. Calcifications also present on the aortic valve leaflets. Three-vessel coronary artery disease is present. Bilateral mild gynecomastia is noted. Hepatobiliary: Hypoattenuating focus along the anterior segment 4 of the liver, could reflect focal fatty infiltration or a benign process such as liver cyst or ciliated foregut cyst. No worrisome hepatic lesions. Mild gallbladder distention with borderline mural thickening but without visible calcified intraluminal or intraductal gallstones. No biliary ductal dilatation. Pancreas: Unremarkable. No pancreatic ductal dilatation or surrounding inflammatory changes. Spleen: Normal in size without focal abnormality. Adrenals/Urinary Tract: Stable appearance of the bilateral nodular adrenal glands largest  measuring up to 0.7 cm in the lateral limb of the right adrenal gland. These have been previously characterized as adrenal adenomas. There is extensive bilateral renal atrophy. No worrisome renal lesions. Mild bilateral symmetric perinephric stranding, a nonspecific finding in the setting of dialysis dependence. No urolithiasis or hydronephrosis. Urinary bladder is largely decompressed at the time of exam and therefore poorly evaluated by CT imaging. Stomach/Bowel: Distal esophagus, stomach and duodenal sweep are unremarkable. No small bowel wall thickening or dilatation. No evidence of obstruction. A normal appendix is visualized. Diffuse pancolonic edematous mural thickening with mucosal hyperemia and faint pericolonic hazy stranding. The colon is fluid-filled. Vascular/Lymphatic: Atherosclerotic plaque within the normal caliber aorta. No suspicious or enlarged lymph nodes in the included lymphatic chains. Reproductive: Mild prostatomegaly. Seminal vesicles are unremarkable. Other: No bowel containing hernias. Evidence of prior right inguinal hernia repair. No free fluid or free air in the abdomen or pelvis. Faint pericolonic stranding, as detailed above. Musculoskeletal: Prior L4-S1 posterior spinal fusion and decompression no evidence of acute hardware complication. Chronic endplate changes L3 are similar to comparison exam. No acute or worrisome osseous lesions. Additional degenerative changes noted in the lower thoracic spine, both SI joints and hips. IMPRESSION: 1. Diffuse pancolonic edematous mural thickening with mucosal hyperemia and faint pericolonic hazy stranding, consistent with colitis of infectious or inflammatory etiology. 2. Mild gallbladder distention with borderline mural thickening but without visible calcified intraluminal or intraductal gallstones. If there is concern for acute cholecystitis, recommend further evaluation with right upper quadrant ultrasound. 3. Bilateral renal atrophy and  faint perinephric stranding, similar to comparison. 4. Stable bilateral adrenal adenomas. 5. Three-vessel coronary artery disease. 6. Aortic Atherosclerosis (ICD10-I70.0). 7. Prior L4-S1 posterior spinal fusion and decompression. Adjacent segment disease at L3-4 is similar to comparison. 8. Mild prostatomegaly. Electronically Signed   By: Lovena Le M.D.   On: 04/24/2019 20:52   DG Chest Port 1 View  Result Date: 04/24/2019 CLINICAL DATA:  Vomiting EXAM: PORTABLE CHEST 1 VIEW COMPARISON:  11/04/2018 FINDINGS: Heart and mediastinal contours are within normal limits. No focal opacities or effusions. No acute bony abnormality. Aortic atherosclerosis. IMPRESSION: No active disease. Electronically Signed   By: Rolm Baptise M.D.   On: 04/24/2019 18:35   Medications:  . amLODipine  5 mg Oral Daily  . aspirin EC  81 mg Oral Daily  . atorvastatin  40 mg Oral q1800  . calcitRIOL  0.5 mcg Oral Q M,W,F-HD  . calcium acetate  1,334 mg Oral BID  . Chlorhexidine Gluconate Cloth  6 each Topical Q0600  . clopidogrel  75 mg Oral Daily  . clotrimazole   Topical BID  . cyclobenzaprine  10 mg Oral TID  . gabapentin  100 mg Oral BID  .  heparin  5,000 Units Subcutaneous Q8H  . multivitamin  1 tablet Oral QHS  . mupirocin ointment   Topical Daily  . pantoprazole  40 mg Oral Daily    Dialysis Orders: Center: [facility] on MWF. Time: 4 hours, 180 NRe, BFR 450, DFR 800, EDW 74kg, 2K/2Ca, UF Profile 2, LUE AVF Heparin 5000 unit bolus Calcitriol 0.61mcg PO q HD Parsabiv 2.5 mg IV post HD Calcium acetate 3 tabs PO AC  Assessment/Plan: 1.  Diarrhea: COVID 19 and C diff negative. CT scan consistent with colitis. Admitted to observation for supportive care. Improved today. Per primary team. 2.  ESRD:  Dialyzes MWF, missed last HD due to diarrhea. Creatinine 17.37 > 11.17 post HD, K+ 4.0. Not in acute distress, continue MWF schedule.  3.  HypOtension/volume: Hypotensive on arrival, received 525mL  lactated ringers. Slightly volume overloaded today and was unable to tolerate UF yesterday due to low BP. Start midodrine 5mg  PO pre-HD. Pt says has hx of low BP's at OP HD unit.  4.  Anemia: Hgb 11.4. No indication for ESA/IV Fe.  5.  Metabolic bone disease: Corrected calcium 8.7. Continue calcitriol. Parsabiv not on formulary, resume outpatient. Phos 5.9, resume calcium acetate.  6.  Nutrition:  Renal diet as tolerated per primary  Anice Paganini, PA-C 04/26/2019, 11:28 AM  [facility] Pager: [phone]  Pt seen, examined and agree w A/P as above.  Kelly Splinter  MD 04/26/2019, 11:54 AM

## 2019-04-27 ENCOUNTER — Telehealth: Payer: Self-pay | Admitting: Physician Assistant

## 2019-04-27 NOTE — Telephone Encounter (Signed)
Transition of Care from [place]  Date of discharge: 04/26/19 Date of contact: 04/27/19 Method of contact: Phone  Patient contacted to discuss transition of care from recent inpatient hospitalization. Patient did not answer the phone. Mailbox full, unable to leave message. Will continue to attempt to reach the patient by phone/at outpatient HD.  Anice Paganini, PA-C 04/27/2019, 12:48 PM  [facility] Pager: [phone]

## 2019-04-28 ENCOUNTER — Telehealth: Payer: Self-pay | Admitting: Nurse Practitioner

## 2019-04-28 NOTE — Telephone Encounter (Signed)
Transition of Care from [place]  Date of discharge: 04/26/19 Date of contact: 04/28/19 Method of contact: Phone  Patient contacted to discuss transition of care from recent inpatient hospitalization. Patient did not answer the phone. Left message to call OP HD center [phone].

## 2019-04-29 ENCOUNTER — Other Ambulatory Visit: Payer: Self-pay

## 2019-04-29 ENCOUNTER — Encounter: Payer: Self-pay | Admitting: Podiatry

## 2019-04-29 ENCOUNTER — Ambulatory Visit (INDEPENDENT_AMBULATORY_CARE_PROVIDER_SITE_OTHER): Payer: Medicare Other | Admitting: Podiatry

## 2019-04-29 ENCOUNTER — Telehealth: Payer: Self-pay | Admitting: Nephrology

## 2019-04-29 DIAGNOSIS — L97511 Non-pressure chronic ulcer of other part of right foot limited to breakdown of skin: Secondary | ICD-10-CM

## 2019-04-29 DIAGNOSIS — E1142 Type 2 diabetes mellitus with diabetic polyneuropathy: Secondary | ICD-10-CM

## 2019-04-29 DIAGNOSIS — E1151 Type 2 diabetes mellitus with diabetic peripheral angiopathy without gangrene: Secondary | ICD-10-CM

## 2019-04-29 DIAGNOSIS — E08621 Diabetes mellitus due to underlying condition with foot ulcer: Secondary | ICD-10-CM | POA: Diagnosis not present

## 2019-04-29 LAB — GI PATHOGEN PANEL BY PCR, STOOL
Adenovirus F 40/41: NOT DETECTED
Astrovirus: NOT DETECTED
Campylobacter by PCR: DETECTED — AB
Cryptosporidium by PCR: NOT DETECTED
Cyclospora cayetanensis: NOT DETECTED
E coli (ETEC) LT/ST: NOT DETECTED
E coli (STEC): NOT DETECTED
Entamoeba histolytica: NOT DETECTED
Enteroaggregative E coli: NOT DETECTED
Enteropathogenic E coli: NOT DETECTED
G lamblia by PCR: NOT DETECTED
Norovirus GI/GII: NOT DETECTED
Plesiomonas shigelloides: NOT DETECTED
Rotavirus A by PCR: NOT DETECTED
Salmonella by PCR: NOT DETECTED
Sapovirus: NOT DETECTED
Shigella by PCR: NOT DETECTED
Vibrio cholerae: NOT DETECTED
Vibrio: NOT DETECTED
Yersinia enterocolitica: NOT DETECTED

## 2019-04-29 NOTE — Telephone Encounter (Signed)
Transition of Care from [facility]  Date of discharge: 04/26/19 Date of contact: 04/29/19 Method of contact: Phone  Patient contacted to discuss transition of care from recent inpatient hospitalization. Patient did not answer the phone. Will continue to attempt to reach the patient at outpatient HD.

## 2019-04-29 NOTE — Progress Notes (Signed)
Subjective:   [name] presents for continued care of ulceration of plantar aspect of right foot.  Patient has been performing daily dressing changes utilizing Mupirocin Ointment. He continues to have foot pain with weightbearing. He states even with offloading with Peg Assist, his foot is still painful.  He has been taking his Keflex and continues to do so. He states he was hospitalized for diarrhea (gastroenteritis per Discharge Summary). Labs were negative for C. Difficile.  Today, patient denies any fever, chills, nightsweats, nausea or vomiting.  Current Outpatient Medications on File Prior to Visit  Medication Sig Dispense Refill  . acetaminophen (TYLENOL) 500 MG tablet Take 1,000 mg by mouth every 4 (four) hours as needed for moderate pain or headache.     Marland Kitchen aspirin EC 81 MG tablet Take 1 tablet (81 mg total) by mouth daily.    Marland Kitchen atorvastatin (LIPITOR) 40 MG tablet Take 1 tablet (40 mg total) by mouth daily at 6 PM. 30 tablet 5  . calcium acetate (PHOSLO) 667 MG capsule Take 3 capsules (2,001 mg total) by mouth 3 (three) times daily with meals. (Patient taking differently: Take 1,334 mg by mouth 2 (two) times daily. ) 270 capsule 0  . cephALEXin (KEFLEX) 500 MG capsule Take 1 capsule (500 mg total) by mouth 2 (two) times daily for 10 days. 20 capsule 0  . clopidogrel (PLAVIX) 75 MG tablet Take 75 mg by mouth daily.    . clotrimazole (LOTRIMIN) 1 % cream Apply between toes and under toes twice daily for 4 weeks. 30 g 1  . cyclobenzaprine (FLEXERIL) 10 MG tablet Take 10 mg by mouth 3 (three) times daily.    Marland Kitchen gabapentin (NEURONTIN) 100 MG capsule Take 100 mg by mouth 2 (two) times daily.     Marland Kitchen HYDROcodone-acetaminophen (NORCO/VICODIN) 5-325 MG tablet Take 1 tablet by mouth every 4 (four) hours as needed. 10 tablet 0  . loperamide (IMODIUM) 2 MG capsule Take 2 mg by mouth as needed for diarrhea or loose stools.    . midodrine (PROAMATINE) 5 MG tablet Take 1 tablet (5 mg total) by  mouth every Monday, Wednesday, and Friday with hemodialysis. 12 tablet 2  . multivitamin (RENA-VIT) TABS tablet Take 1 tablet by mouth at bedtime. 30 tablet 0  . mupirocin ointment (BACTROBAN) 2 % Apply to right foot once daily. 30 g 1  . nitroGLYCERIN (NITROSTAT) 0.4 MG SL tablet Place 1 tablet (0.4 mg total) under the tongue every 5 (five) minutes as needed for chest pain. 25 tablet 3  . oxyCODONE (OXY IR/ROXICODONE) 5 MG immediate release tablet Take 5 mg by mouth every 6 (six) hours as needed for moderate pain.     . pantoprazole (PROTONIX) 40 MG tablet Take 40 mg by mouth daily.    . polyethylene glycol (MIRALAX / GLYCOLAX) packet Take 17 g by mouth daily as needed for moderate constipation. 14 each 0  . tamsulosin (FLOMAX) 0.4 MG CAPS capsule Take 0.4 mg by mouth daily.     No current facility-administered medications on file prior to visit.     Allergies  Allergen Reactions  . Kiwi Extract Itching, Swelling and Other (See Comments)    Lips and face swell- breathing not affected  . Flexeril [Cyclobenzaprine]     Hands become flimsy, can not hold things  . Other Other (See Comments)  . Tape Other (See Comments)    "Plastic" tape causes blisters!!     Objective:   Vascular Examination: There  were no vitals filed for this visit.  Capillary refill time <3 seconds b/l.   Dorsalis pedis pulses nonpalpable b/l.  Posterior tibial pulses nonpalpable b/l.  Digital hair absent b/l.  Skin temperature gradient WNL b/l.  Dermatological Examination: Skin thin, shiny and atrophic b/l.  Toenails 1-5 b/l recently debrided.  Ulceration located plantar 5th metatarsal head right foot: Measurements carried out today of 1.5 x 1.5 cm.  Extremely tender to palpation. Superficial with centralized area of beefy red granulation tissue. No periulcerative erythema, no edema, no drainage.  Flocculence absent.  Malodor absent. No tracking, no tunneling, no undermining. No probing to bone, no purulent  drainage.  No deep abscess evident.  Musculoskeletal: Muscle strength 5/5 to all LE muscle groups bilaterally.  Neurological: Sensation diminished bilaterally with 10 gram monofilament.  Vascular Ultrasound Lower Extremity Summary: Right: Atherosclerosis in the common femoral, femoral, popliteal and tibial arteries. Three vessel run off. 75-99% stenosis noted in the popliteal artery.  Assessment:   1.  Diabetic Ulceration plantar right foot 2.    NIDDM with PAD/peripheral neuropathy  Plan: 1. Patient was seen by Dr. Milinda Pointer on today as well. 2. Continue Keflex until all gone.Continue daily Mupirocin dressings to right foot. Keep foot dry. 3. Ulcer was cleansed with wound cleanser. Triple antibioitic ointment was applied to base of wound with light dressing. Explained pain may more than likely be ischemic in nature. 4. Continue Surgical Shoe with Peg Assist to offload area. 5. Patient was given instructions on offloading and dressing change/aftercare and was instructed to call immediately if any signs or symptoms of infection arise.  6. Patient is to follow up this Thursday with Dr. March Rummage. 7. Patient instructed to report to emergency department with worsening appearance of ulcer/toe/foot, increased pain, foul odor, increased redness, swelling, drainage, fever, chills, nightsweats, nausea, vomiting, increased blood sugar.  8. Patient/POA related understanding.

## 2019-05-01 ENCOUNTER — Other Ambulatory Visit: Payer: Self-pay

## 2019-05-01 ENCOUNTER — Ambulatory Visit (INDEPENDENT_AMBULATORY_CARE_PROVIDER_SITE_OTHER): Payer: Medicare Other | Admitting: Podiatry

## 2019-05-01 DIAGNOSIS — E08621 Diabetes mellitus due to underlying condition with foot ulcer: Secondary | ICD-10-CM

## 2019-05-01 DIAGNOSIS — L97511 Non-pressure chronic ulcer of other part of right foot limited to breakdown of skin: Secondary | ICD-10-CM | POA: Diagnosis not present

## 2019-05-01 DIAGNOSIS — M21621 Bunionette of right foot: Secondary | ICD-10-CM | POA: Diagnosis not present

## 2019-05-01 DIAGNOSIS — L909 Atrophic disorder of skin, unspecified: Secondary | ICD-10-CM | POA: Diagnosis not present

## 2019-05-01 LAB — HEPATITIS B SURFACE ANTIBODY, QUANTITATIVE: Hep B S AB Quant (Post): 467.5 m[IU]/mL (ref >9.9)

## 2019-05-01 NOTE — Progress Notes (Signed)
  Subjective:  Patient ID: Jon Anderson, male    DOB: Apr 26, 1950,  MRN: [id number]  Chief Complaint  Patient presents with  . Diabetic Ulcer    Pt states bilateral sub 5th digit ulcers on plantar aspect, 3-4 years duration, right foot has blood drainage without pus, left no drainage. Denies fever/chills/nausea/vomiting.   69 y.o. male presents for wound care. Hx confirmed with patient.  Objective:  Physical Exam: Wound Location: Right 5th MPJ Wound Measurement: 0.3x0.3 Wound Base: Granular/Healthy Peri-wound: Calloused Exudate: Scant/small amount Serosanguinous exudate wound without warmth, erythema, signs of acute infection and appearance is similar to last recheck    Radiographs:  X-ray of the right foot: personally reviwed  Assessment:   1. Diabetic ulcer of other part of right foot associated with diabetes mellitus due to underlying condition, limited to breakdown of skin ([place])   2. Fat pad atrophy of foot   3. Tailor's bunion of right foot      Plan:  Patient was evaluated and treated and all questions answered.  Ulcer Right 5th Metatarsophalangeal Joint -Wound minimally debrided today. Limited 2/2 pain -Discussed with patient he would benefit from surgical intervention consisting of wound debridement, fat grafting supplementation. Discussed this is lower risk than excising the metatarsal head and this would be reserved for if the first procedure fails. -Vascular studies reviewed.  Procedure: Selective Debridement of Wound Rationale: Removal of devitalized tissue from the wound to promote healing.  Pre-Debridement Wound Measurements: 0.3 cm x 0.3 cm x 0.2 cm  Post-Debridement Wound Measurements: same as pre-debridement. Type of Debridement: sharp selective Tissue Removed: Devitalized soft-tissue Dressing: Dry, sterile, compression dressing. Disposition: Patient tolerated procedure well. Patient to return in 1 week for follow-up.   Return for post-procedure  care.

## 2019-05-01 NOTE — Patient Instructions (Signed)
Pre-Operative Instructions  Congratulations, you have decided to take an important step towards improving your quality of life.  You can be assured that the doctors and staff at [facility] will be with you every step of the way.  Here are some important things you should know:  1. Plan to be at the surgery center/hospital at least 1 (one) hour prior to your scheduled time, unless otherwise directed by the surgical center/hospital staff.  You must have a responsible adult accompany you, remain during the surgery and drive you home.  Make sure you have directions to the surgical center/hospital to ensure you arrive on time. 2. If you are having surgery at [facility] or [address] [facility] [address] hospitals, you will need a copy of your medical history and physical form from your family physician within one month prior to the date of surgery. We will give you a form for your primary physician to complete.  3. We make every effort to accommodate the date you request for surgery.  However, there are times where surgery dates or times have to be moved.  We will contact you as soon as possible if a change in schedule is required.   4. No aspirin/ibuprofen for one week before surgery.  If you are on aspirin, any non-steroidal anti-inflammatory medications (Mobic, Aleve, Ibuprofen) should not be taken seven (7) days prior to your surgery.  You make take Tylenol for pain prior to surgery.  5. Medications - If you are taking daily heart and blood pressure medications, seizure, reflux, allergy, asthma, anxiety, pain or diabetes medications, make sure you notify the surgery center/hospital before the day of surgery so they can tell you which medications you should take or avoid the day of surgery. 6. No food or drink after midnight the night before surgery unless directed otherwise by surgical center/hospital staff. 7. No alcoholic beverages 24-hours prior to surgery.  No smoking 24-hours prior or 24-hours after  surgery. 8. Wear loose pants or shorts. They should be loose enough to fit over bandages, boots, and casts. 9. Don't wear slip-on shoes. Sneakers are preferred. 10. Bring your boot with you to the surgery center/hospital.  Also bring crutches or a walker if your physician has prescribed it for you.  If you do not have this equipment, it will be provided for you after surgery. 11. If you have not been contacted by the surgery center/hospital by the day before your surgery, call to confirm the date and time of your surgery. 12. Leave-time from work may vary depending on the type of surgery you have.  Appropriate arrangements should be made prior to surgery with your employer. 13. Prescriptions will be provided immediately following surgery by your doctor.  Fill these as soon as possible after surgery and take the medication as directed. Pain medications will not be refilled on weekends and must be approved by the doctor. 14. Remove nail polish on the operative foot and avoid getting pedicures prior to surgery. 15. Wash the night before surgery.  The night before surgery wash the foot and leg well with water and the antibacterial soap provided. Be sure to pay special attention to beneath the toenails and in between the toes.  Wash for at least three (3) minutes. Rinse thoroughly with water and dry well with a towel.  Perform this wash unless told not to do so by your physician.  Enclosed: 1 Ice pack (please put in freezer the night before surgery)   1 Hibiclens skin cleaner     Pre-op instructions  If you have any questions regarding the instructions, please do not hesitate to call our office.  [place]: [address], [place], [place] [address] -- [phone]  [place]: [address] [place], [place] [address] -- [phone]  [place]: [address], , [place] [address] -- [phone]   Website: [email or web address] 

## 2019-05-01 NOTE — H&P (View-Only) (Signed)
  Subjective:  Patient ID: Jon Anderson, male    DOB: Oct 07, 1950,  MRN: [id number]  Chief Complaint  Patient presents with  . Diabetic Ulcer    Pt states bilateral sub 5th digit ulcers on plantar aspect, 3-4 years duration, right foot has blood drainage without pus, left no drainage. Denies fever/chills/nausea/vomiting.   69 y.o. male presents for wound care. Hx confirmed with patient.  Objective:  Physical Exam: Wound Location: Right 5th MPJ Wound Measurement: 0.3x0.3 Wound Base: Granular/Healthy Peri-wound: Calloused Exudate: Scant/small amount Serosanguinous exudate wound without warmth, erythema, signs of acute infection and appearance is similar to last recheck    Radiographs:  X-Jon of the right foot: personally reviwed  Assessment:   1. Diabetic ulcer of other part of right foot associated with diabetes mellitus due to underlying condition, limited to breakdown of skin ([place])   2. Fat pad atrophy of foot   3. Tailor's bunion of right foot      Plan:  Patient was evaluated and treated and all questions answered.  Ulcer Right 5th Metatarsophalangeal Joint -Wound minimally debrided today. Limited 2/2 pain -Discussed with patient he would benefit from surgical intervention consisting of wound debridement, fat grafting supplementation. Discussed this is lower risk than excising the metatarsal head and this would be reserved for if the first procedure fails. -Vascular studies reviewed.  Procedure: Selective Debridement of Wound Rationale: Removal of devitalized tissue from the wound to promote healing.  Pre-Debridement Wound Measurements: 0.3 cm x 0.3 cm x 0.2 cm  Post-Debridement Wound Measurements: same as pre-debridement. Type of Debridement: sharp selective Tissue Removed: Devitalized soft-tissue Dressing: Dry, sterile, compression dressing. Disposition: Patient tolerated procedure well. Patient to return in 1 week for follow-up.   Return for post-procedure  care.

## 2019-05-08 ENCOUNTER — Other Ambulatory Visit: Payer: Self-pay

## 2019-05-08 ENCOUNTER — Telehealth: Payer: Self-pay | Admitting: *Deleted

## 2019-05-08 ENCOUNTER — Telehealth: Payer: Self-pay | Admitting: Podiatry

## 2019-05-08 ENCOUNTER — Encounter (HOSPITAL_BASED_OUTPATIENT_CLINIC_OR_DEPARTMENT_OTHER): Payer: Self-pay | Admitting: Podiatry

## 2019-05-08 NOTE — Telephone Encounter (Signed)
DOS 05/13/2019 WOUND DEBRIDEMENT - 46997 AND APPLICATION OF FAT GRAFT - 80208 OF THE RIGHT FOOT  [employer]HC: Eligibility Date - 04/11/2019 - Present  Plan Deductible Per Service Year $0.00 of $0.00 Met  Out-of-Pocket Maximum Per Service Year $120.00 of $4,500.00 Met Remaining: $4,380.00   CO-PAY $225 / Day OUTPATIENT SURGERY $225 / [place] 0% / Day OUTPATIENT SURGERY 0% / [place]   This [employer] Medicare Advantage members plan does not currently require a prior authorization for these services. If you have general questions about the prior authorization requirements, please call us at [phone] or visit [email or web address] > Clinician Resources > Advance and Admission Notification Requirements. The number above acknowledges your notification. Please write this number down for future reference. Notification is not a guarantee of coverage or payment.  Decision ID #:[id number]

## 2019-05-08 NOTE — Progress Notes (Addendum)
Spoke w/ via phone for pre-op interview--- Lab needs dos----  I stat 8             Lab results------ COVID test ------05-09-2019 Arrive at -------1245 pm 05-13-2019  no food after midnight clear liquids until 845 am then npo. medications to take morning of surgery -----gabapentin, pantaprazole Diabetic medication -----does not take any diabetic meds, diet controlled Patient Special Instructions ----- Pre-Op special Istructions -----follow antiseptic foot wash instructions from dr price for hs night before surgery, wash foot up to knee with antiseptic soap per patient. Patient verbalized understanding of instructions that were given at this phone interview. Patient denies shortness of breath, chest pain, fever, cough a this phone interview.  Anesthesia: esrd, mi, on plavix  PCP:dr roy moiera Cardiologist :dr min pu lov 10-31-2018 chart/care everywhere Chest ct 07-17-2018 chart/epic, chest xray 1 view 04-24-2019 chart/epic EKG :04-24-2019 chart/epic Echo :02-23-2018 chart/epic Cardiac Cath : none Sleep Study/ CPAP :sleep study " a while ago" not sure where done. no cpap ordered yet per patient Fasting Blood Sugar :  90, checks blood sugar q day in am  Blood Thinner/ Instructions /plavix, patient to call dr price to get instructions on stopping plavix. ASA / Instructions/ Last Dose : 81 mg aspirin last dose 05-06-2019 instrcuted by dr Murvin Donning to stop 81 mg aspirin  Patient denies shortness of breath, chest pain, fever, and cough at this phone interview.   Addendum: ok to proceed per Janett Billow zanetto pa, patient advised per dr price office to continue plavix.

## 2019-05-08 NOTE — Telephone Encounter (Signed)
Pt  Called wanted to know if he heneeded to continue taking his plavix. Spoke with surgical cordinator Delydia and informed pt he could continue taking his medication

## 2019-05-09 ENCOUNTER — Other Ambulatory Visit (HOSPITAL_COMMUNITY)
Admission: RE | Admit: 2019-05-09 | Discharge: 2019-05-09 | Disposition: A | Discharge status: Home / Self Care | Payer: Medicare Other | Source: Ambulatory Visit | Attending: Podiatry | Admitting: Podiatry

## 2019-05-09 ENCOUNTER — Telehealth: Payer: Self-pay | Admitting: Podiatry

## 2019-05-09 DIAGNOSIS — Z01812 Encounter for preprocedural laboratory examination: Secondary | ICD-10-CM | POA: Diagnosis present

## 2019-05-09 DIAGNOSIS — Z20822 Contact with and (suspected) exposure to covid-19: Secondary | ICD-10-CM | POA: Diagnosis not present

## 2019-05-09 LAB — SARS CORONAVIRUS 2 (TAT 6-24 HRS): SARS Coronavirus 2: NEGATIVE

## 2019-05-09 NOTE — Telephone Encounter (Signed)
We need orders on pt for his surgery on 05/13/19. Thank you.

## 2019-05-09 NOTE — Progress Notes (Signed)
Anesthesia Chart Review   Case: [id number] Date/Time: 05/13/19 1430   Procedures:      DEBRIDEMENT WOUND (Right )     FAT GRAFT APPLICATION (Right )   Anesthesia type: General   Pre-op diagnosis: DIABETIC ULCER   Location: [facility] OR ROOM 3 / [place]   Surgeons: Evelina Bucy, DPM      DISCUSSION:69 y.o. former smoker (2 pack years) with h/o HLD, HTN, GERD, CAD (CABG x1 with an LIMA to LAD in 2018 ), ESRD on dialysis MWF, sleep apnea, DM II, diabetic ulcer scheduled for above procedure 05/13/19 with Hardie Pulley, DPM.   Pt underwent diagnostic catheterization in January 2019 by Dr. Gwenlyn Found for complaints of chest pain that revealed a patent graft and no significant residual coronary disease.  Evaluated in the hospital 07/18/2018 for chest pain which was deemed to be noncardiac per cardiology consult.  Presented to hospital again 08/23/2018 for chest pain.  Per consult note, "From a cardiovascular perspective, no indication for cardiac admission for inpatient testing at thie time."    Last seen by cardiologist, Dr. Freddi Starr, 10/31/2018.  Stable at this visit.  Pt denies recurrent chest pain during PAT nurse interview.  He was advised to continue Plavix per Dr. Eleanora Neighbor office.  VS: Ht 5' 9.5" (1.765 m)   Wt 74.4 kg   BMI 23.87 kg/m   PROVIDERS: [name], MD is PCP   Pu, [place], MD is Cardiologist  LABS: labs DOS (all labs ordered are listed, but only abnormal results are displayed)  Labs Reviewed - No data to display   IMAGES:   EKG: 04/24/2019 Rate 102 bpm  Sinus tachycardia Ventricular premature complex Anterior infarct, old  CV: Echo 02/23/18 Study Conclusions   - Left ventricle: The cavity size was normal. There was severe  concentric hypertrophy. Systolic function was normal. The  estimated ejection fraction was in the range of 60% to 65%. Wall  motion was normal; there were no regional wall motion  abnormalities. There was an increased relative  contribution of  atrial contraction to ventricular filling. Doppler parameters are  consistent with abnormal left ventricular relaxation (grade 1  diastolic dysfunction). Doppler parameters are consistent with  high ventricular filling pressure.  - Aortic valve: Trileaflet; mildly thickened, mildly calcified  leaflets. Valve area (VTI): 1.99 cm^2. Valve area (Vmax): 2.12  cm^2. Valve area (Vmean): 2.02 cm^2.  - Mitral valve: Severely calcified annulus. Moderate diffuse  thickening and calcification of the anterior leaflet and  posterior leaflet. Mobility of the posterior leaflet was  restricted to the point of immobility. The findings are  consistent with moderate stenosis. There was mild regurgitation.  Mean gradient (D): 7 mm Hg. Valve area by continuity equation  (using LVOT flow): 1.11 cm^2.  - Left atrium: The atrium was mildly dilated.  - Pulmonary arteries: Systolic pressure could not be accurately  estimated as IVC is not visualized.   Impressions:   - The right ventricular systolic pressure was increased consistent  with mild pulmonary hypertension.   04/27/2017  Prox LAD lesion is 75% stenosed. -Focal lesion that was previously described.  LIMA-LAD is widely patent and is normal in caliber. There is competitive flow.  Otherwise minimal disease throughout. Nothing made greater than 40% in the ostial circumflex.  The left ventricular systolic function is normal. The left ventricular ejection fraction is 50-55% by visual estimate.  LV end diastolic pressure is low. - ~0-3 mmHg  There is no aortic valve stenosis. Past  Medical History:  Diagnosis Date  . Anemia of chronic disease   . CAD (coronary artery disease)    a.  Myoview 4/11: EF 53%, no scar or ischemia   c. MV 2012 Nl perfusion, apical thinning.  No ischemia or scar.  EF 49%, appears greater by visual estimate.;  d.  Dob stress echo 12/13:  Negative Dob stress echo. There is no  evidence of ischemia.  The LVF is normal. b. Normal cors 2016.  . Carotid stenosis    a. <29% RICA, >92% LICA by duplex 07/2681  . Chronic chest pain    occ  . ESRD (end stage renal disease) on dialysis ([place])    M-W-F  . GERD (gastroesophageal reflux disease)   . HNP (herniated nucleus pulposus), lumbar   . HTN (hypertension)    echo 3/10: EF 60%, LAE  . Hyperlipidemia   . Nephrolithiasis    "passed them all"  . Peripheral arterial disease ([place])    a. s/p PTCA to L SFA.  Marland Kitchen Pneumonia yrs ago  . Restless legs   . Sciatic leg pain   . Sleep apnea    no cpap, needs to reschedule appointment to set up aquiring cpap  . Snores    a. presumed OSA, pt has refused sleep eval in past.  . Type II diabetes mellitus ([place])    no longer on medications, checks blood glucose at home  . Urinary frequency     Past Surgical History:  Procedure Laterality Date  . ANGIOPLASTY / STENTING FEMORAL Left 12/11/2013   dr berry  . AV FISTULA PLACEMENT Left 03/19/2014   Procedure: CREATION OF ARTERIOVENOUS (AV) FISTULA  LEFT UPPER ARM;  Surgeon: Mal Misty, MD;  Location: [place];  Service: Vascular;  Laterality: Left;  . BACK SURGERY  01/2018   screws placed   . CARDIAC CATHETERIZATION  2001 and 2010   . COLONOSCOPY W/ BIOPSIES AND POLYPECTOMY    . COLONOSCOPY WITH PROPOFOL N/A 08/01/2016   Procedure: COLONOSCOPY WITH PROPOFOL;  Surgeon: Carol Ada, MD;  Location: [facility];  Service: Endoscopy;  Laterality: N/A;  . CORONARY ARTERY BYPASS GRAFT  2019   [facility] x 1 bypass  . ESOPHAGOGASTRODUODENOSCOPY (EGD) WITH PROPOFOL N/A 08/01/2016   Procedure: ESOPHAGOGASTRODUODENOSCOPY (EGD) WITH PROPOFOL;  Surgeon: Carol Ada, MD;  Location: [facility];  Service: Endoscopy;  Laterality: N/A;  . FOOT FRACTURE SURGERY Right    ligament repair  . FRACTURE SURGERY     left forearm  . INGUINAL HERNIA REPAIR Left   . LEFT HEART CATH AND CORS/GRAFTS ANGIOGRAPHY N/A 04/27/2017   Procedure: LEFT HEART CATH  AND CORS/GRAFTS ANGIOGRAPHY;  Surgeon: Leonie Man, MD;  Location: [place] CV LAB;  Service: Cardiovascular;  Laterality: N/A;  . LEFT HEART CATHETERIZATION WITH CORONARY ANGIOGRAM N/A 06/22/2014   Procedure: LEFT HEART CATHETERIZATION WITH CORONARY ANGIOGRAM;  Surgeon: Troy Sine, MD;  Location: [facility] CATH LAB;  Service: Cardiovascular;  Laterality: N/A;  . LOWER EXTREMITY ANGIOGRAM Left 12/11/2013   Procedure: LOWER EXTREMITY ANGIOGRAM;  Surgeon: Lorretta Harp, MD;  Location: [facility] CATH LAB;  Service: Cardiovascular;  Laterality: Left;  . LUMBAR LAMINECTOMY/DECOMPRESSION MICRODISCECTOMY Right 07/03/2017   Procedure: MICRODISCECTOMY LUMBAR FIVE - SACRAL ONE RIGHT;  Surgeon: Consuella Lose, MD;  Location: [place];  Service: Neurosurgery;  Laterality: Right;  . LUMBAR LAMINECTOMY/DECOMPRESSION MICRODISCECTOMY Right 10/19/2017   Procedure: MICRODISCECTOMY LUMBAR FIVE- SACRAL 1 ONE ;  Surgeon: Consuella Lose, MD;  Location: [place];  Service: Neurosurgery;  Laterality: Right;  . TONSILLECTOMY AND ADENOIDECTOMY    . WISDOM TOOTH EXTRACTION      MEDICATIONS: No current facility-administered medications for this encounter.   . cephALEXin (KEFLEX) 500 MG capsule  . cinacalcet (SENSIPAR) 60 MG tablet  . acetaminophen (TYLENOL) 500 MG tablet  . aspirin EC 81 MG tablet  . atorvastatin (LIPITOR) 40 MG tablet  . calcium acetate (PHOSLO) 667 MG capsule  . clopidogrel (PLAVIX) 75 MG tablet  . clotrimazole (LOTRIMIN) 1 % cream  . Etelcalcetide HCl (PARSABIV IV)  . gabapentin (NEURONTIN) 100 MG capsule  . loperamide (IMODIUM) 2 MG capsule  . midodrine (PROAMATINE) 5 MG tablet  . multivitamin (RENA-VIT) TABS tablet  . mupirocin ointment (BACTROBAN) 2 %  . nitroGLYCERIN (NITROSTAT) 0.4 MG SL tablet  . ONETOUCH ULTRA test strip  . oxyCODONE (OXY IR/ROXICODONE) 5 MG immediate release tablet  . pantoprazole (PROTONIX) 40 MG tablet  . polyethylene glycol [facility] / GLYCOLAX) packet    Maia Plan [facility] Pre-Surgical Testing [phone] 05/09/19  12:56 PM

## 2019-05-12 NOTE — Anesthesia Preprocedure Evaluation (Addendum)
Anesthesia Evaluation  Patient identified by MRN, date of birth, ID band Patient awake    Reviewed: Allergy & Precautions, NPO status , Patient's Chart, lab work & pertinent test results  Airway Mallampati: I  TM Distance: >3 FB Neck ROM: Full    Dental no notable dental hx. (+) Lower Dentures, Upper Dentures   Pulmonary sleep apnea , former smoker,    Pulmonary exam normal breath sounds clear to auscultation       Cardiovascular hypertension, Pt. on medications + CAD, + Peripheral Vascular Disease and + Orthopnea  Normal cardiovascular exam Rhythm:Regular Rate:Normal  04/24/19 ST R 102 w PVC and Qs in II,II, AVF EF 60-65%   Neuro/Psych negative neurological ROS  negative psych ROS   GI/Hepatic Neg liver ROS, GERD  Medicated,  Endo/Other  diabetes, Type 2  Renal/GU Dialysis and ESRFRenal diseaseMWF Dialysis K+ 4.0 Cr 11.17     Musculoskeletal negative musculoskeletal ROS (+)   Abdominal   Peds  Hematology  (+) anemia , Hgb 11.4 Plt 233   Anesthesia Other Findings   Reproductive/Obstetrics                         Anesthesia Physical Anesthesia Plan  ASA: III  Anesthesia Plan: MAC   Post-op Pain Management:    Induction: Intravenous  PONV Risk Score and Plan: Treatment may vary due to age or medical condition, Ondansetron, Dexamethasone and Midazolam  Airway Management Planned: Nasal Cannula and Natural Airway  Additional Equipment: None  Intra-op Plan:   Post-operative Plan:   Informed Consent: I have reviewed the patients History and Physical, chart, labs and discussed the procedure including the risks, benefits and alternatives for the proposed anesthesia with the patient or authorized representative who has indicated his/her understanding and acceptance.       Plan Discussed with: CRNA and Surgeon  Anesthesia Plan Comments:       Anesthesia Quick Evaluation

## 2019-05-12 NOTE — Telephone Encounter (Signed)
Orders placed.

## 2019-05-13 ENCOUNTER — Ambulatory Visit (HOSPITAL_BASED_OUTPATIENT_CLINIC_OR_DEPARTMENT_OTHER): Payer: Medicare Other | Admitting: Physician Assistant

## 2019-05-13 ENCOUNTER — Ambulatory Visit (HOSPITAL_BASED_OUTPATIENT_CLINIC_OR_DEPARTMENT_OTHER)
Admission: RE | Admit: 2019-05-13 | Discharge: 2019-05-13 | Disposition: A | Discharge status: Home / Self Care | Payer: Medicare Other | Attending: Podiatry | Admitting: Podiatry

## 2019-05-13 ENCOUNTER — Encounter (HOSPITAL_BASED_OUTPATIENT_CLINIC_OR_DEPARTMENT_OTHER): Payer: Self-pay | Admitting: Podiatry

## 2019-05-13 ENCOUNTER — Encounter (HOSPITAL_BASED_OUTPATIENT_CLINIC_OR_DEPARTMENT_OTHER)
Admission: RE | Disposition: A | Discharge status: Home / Self Care | Payer: Self-pay | Source: Home / Self Care | Attending: Podiatry

## 2019-05-13 ENCOUNTER — Other Ambulatory Visit: Payer: Self-pay

## 2019-05-13 DIAGNOSIS — Z87891 Personal history of nicotine dependence: Secondary | ICD-10-CM | POA: Insufficient documentation

## 2019-05-13 DIAGNOSIS — N186 End stage renal disease: Secondary | ICD-10-CM | POA: Diagnosis not present

## 2019-05-13 DIAGNOSIS — Z992 Dependence on renal dialysis: Secondary | ICD-10-CM | POA: Diagnosis not present

## 2019-05-13 DIAGNOSIS — Z79899 Other long term (current) drug therapy: Secondary | ICD-10-CM | POA: Diagnosis not present

## 2019-05-13 DIAGNOSIS — I2510 Atherosclerotic heart disease of native coronary artery without angina pectoris: Secondary | ICD-10-CM | POA: Diagnosis not present

## 2019-05-13 DIAGNOSIS — K219 Gastro-esophageal reflux disease without esophagitis: Secondary | ICD-10-CM | POA: Insufficient documentation

## 2019-05-13 DIAGNOSIS — E08621 Diabetes mellitus due to underlying condition with foot ulcer: Secondary | ICD-10-CM | POA: Diagnosis not present

## 2019-05-13 DIAGNOSIS — Z7982 Long term (current) use of aspirin: Secondary | ICD-10-CM | POA: Insufficient documentation

## 2019-05-13 DIAGNOSIS — Z951 Presence of aortocoronary bypass graft: Secondary | ICD-10-CM | POA: Diagnosis not present

## 2019-05-13 DIAGNOSIS — E1122 Type 2 diabetes mellitus with diabetic chronic kidney disease: Secondary | ICD-10-CM | POA: Insufficient documentation

## 2019-05-13 DIAGNOSIS — E65 Localized adiposity: Secondary | ICD-10-CM | POA: Insufficient documentation

## 2019-05-13 DIAGNOSIS — E785 Hyperlipidemia, unspecified: Secondary | ICD-10-CM | POA: Diagnosis not present

## 2019-05-13 DIAGNOSIS — L97412 Non-pressure chronic ulcer of right heel and midfoot with fat layer exposed: Secondary | ICD-10-CM

## 2019-05-13 DIAGNOSIS — L97519 Non-pressure chronic ulcer of other part of right foot with unspecified severity: Secondary | ICD-10-CM | POA: Insufficient documentation

## 2019-05-13 DIAGNOSIS — G2581 Restless legs syndrome: Secondary | ICD-10-CM | POA: Insufficient documentation

## 2019-05-13 DIAGNOSIS — I120 Hypertensive chronic kidney disease with stage 5 chronic kidney disease or end stage renal disease: Secondary | ICD-10-CM | POA: Insufficient documentation

## 2019-05-13 DIAGNOSIS — G4730 Sleep apnea, unspecified: Secondary | ICD-10-CM | POA: Diagnosis not present

## 2019-05-13 DIAGNOSIS — E11621 Type 2 diabetes mellitus with foot ulcer: Secondary | ICD-10-CM | POA: Diagnosis not present

## 2019-05-13 DIAGNOSIS — E1151 Type 2 diabetes mellitus with diabetic peripheral angiopathy without gangrene: Secondary | ICD-10-CM | POA: Diagnosis not present

## 2019-05-13 HISTORY — DX: Frequency of micturition: R35.0

## 2019-05-13 HISTORY — PX: WOUND DEBRIDEMENT: SHX247

## 2019-05-13 HISTORY — PX: GRAFT APPLICATION: SHX6696

## 2019-05-13 HISTORY — DX: Sciatica, unspecified side: M54.30

## 2019-05-13 LAB — POCT I-STAT, CHEM 8
BUN: 28 mg/dL — ABNORMAL HIGH (ref 8–23)
Calcium, Ion: 1.09 mmol/L — ABNORMAL LOW (ref 1.15–1.40)
Chloride: 96 mmol/L — ABNORMAL LOW (ref 98–111)
Creatinine, Ser: 8.5 mg/dL — ABNORMAL HIGH (ref 0.61–1.24)
Glucose, Bld: 78 mg/dL (ref 70–99)
HCT: 44 % (ref 39.0–52.0)
Hemoglobin: 15 g/dL (ref 13.0–17.0)
Potassium: 4.2 mmol/L (ref 3.5–5.1)
Sodium: 137 mmol/L (ref 135–145)
TCO2: 33 mmol/L — ABNORMAL HIGH (ref 22–32)

## 2019-05-13 LAB — GLUCOSE, CAPILLARY: Glucose-Capillary: 84 mg/dL (ref 70–99)

## 2019-05-13 SURGERY — DEBRIDEMENT, WOUND
Anesthesia: General | Laterality: Right

## 2019-05-13 MED ORDER — FENTANYL CITRATE (PF) 100 MCG/2ML IJ SOLN
INTRAMUSCULAR | Status: AC
  Filled 2019-05-13: qty 2

## 2019-05-13 MED ORDER — DEXAMETHASONE SODIUM PHOSPHATE 10 MG/ML IJ SOLN
INTRAMUSCULAR | Status: AC
  Filled 2019-05-13: qty 1

## 2019-05-13 MED ORDER — CEFAZOLIN SODIUM-DEXTROSE 2-4 GM/100ML-% IV SOLN
2.0000 g | INTRAVENOUS | Status: AC
  Administered 2019-05-13: 14:00:00 2 g via INTRAVENOUS
  Filled 2019-05-13: qty 100

## 2019-05-13 MED ORDER — FENTANYL CITRATE (PF) 100 MCG/2ML IJ SOLN
25.0000 ug | INTRAMUSCULAR | Status: DC | PRN
  Filled 2019-05-13: qty 1

## 2019-05-13 MED ORDER — BUPIVACAINE HCL (PF) 0.5 % IJ SOLN
INTRAMUSCULAR | Status: DC | PRN
  Administered 2019-05-13: 10 mL

## 2019-05-13 MED ORDER — PROPOFOL 10 MG/ML IV BOLUS
INTRAVENOUS | Status: DC | PRN
  Administered 2019-05-13: 50 mg via INTRAVENOUS
  Administered 2019-05-13: 10 mg via INTRAVENOUS

## 2019-05-13 MED ORDER — FENTANYL CITRATE (PF) 100 MCG/2ML IJ SOLN
INTRAMUSCULAR | Status: DC | PRN
  Administered 2019-05-13 (×2): 25 ug via INTRAVENOUS

## 2019-05-13 MED ORDER — CEFAZOLIN SODIUM-DEXTROSE 2-4 GM/100ML-% IV SOLN
INTRAVENOUS | Status: AC
  Filled 2019-05-13: qty 100

## 2019-05-13 MED ORDER — ONDANSETRON HCL 4 MG/2ML IJ SOLN
4.0000 mg | Freq: Once | INTRAMUSCULAR | Status: AC | PRN
  Administered 2019-05-13: 14:00:00 4 mg via INTRAVENOUS
  Filled 2019-05-13: qty 2

## 2019-05-13 MED ORDER — OXYCODONE HCL 5 MG PO TABS: 10.0000 mg | ORAL_TABLET | ORAL | 0 refills | Status: DC | PRN

## 2019-05-13 MED ORDER — PROPOFOL 10 MG/ML IV BOLUS
INTRAVENOUS | Status: AC
  Filled 2019-05-13: qty 20

## 2019-05-13 MED ORDER — ACETAMINOPHEN 10 MG/ML IV SOLN
1000.0000 mg | Freq: Once | INTRAVENOUS | Status: DC | PRN
  Filled 2019-05-13: qty 100

## 2019-05-13 MED ORDER — CLINDAMYCIN HCL 150 MG PO CAPS: 150.0000 mg | ORAL_CAPSULE | Freq: Two times a day (BID) | ORAL | 0 refills | Status: DC

## 2019-05-13 MED ORDER — ONDANSETRON HCL 4 MG/2ML IJ SOLN
INTRAMUSCULAR | Status: AC
  Filled 2019-05-13: qty 2

## 2019-05-13 MED ORDER — SODIUM CHLORIDE 0.9 % IV SOLN
INTRAVENOUS | Status: DC
  Filled 2019-05-13: qty 1000

## 2019-05-13 MED ORDER — LIDOCAINE 2% (20 MG/ML) 5 ML SYRINGE
INTRAMUSCULAR | Status: AC
  Filled 2019-05-13: qty 5

## 2019-05-13 SURGICAL SUPPLY — 53 items
APL PRP STRL LF DISP 70% ISPRP (MISCELLANEOUS)
BANDAGE ACE 3X5.8 VEL STRL LF (GAUZE/BANDAGES/DRESSINGS) IMPLANT
BANDAGE ACE 4X5 VEL STRL LF (GAUZE/BANDAGES/DRESSINGS) ×2 IMPLANT
BLADE SURG 15 STRL LF DISP TIS (BLADE) ×1 IMPLANT
BLADE SURG 15 STRL SS (BLADE) ×2
BNDG CMPR 9X4 STRL LF SNTH (GAUZE/BANDAGES/DRESSINGS)
BNDG ELASTIC 6X5.8 VLCR STR LF (GAUZE/BANDAGES/DRESSINGS) ×1 IMPLANT
BNDG ESMARK 4X9 LF (GAUZE/BANDAGES/DRESSINGS) IMPLANT
BNDG GAUZE ELAST 4 BULKY (GAUZE/BANDAGES/DRESSINGS) ×2 IMPLANT
CHLORAPREP W/TINT 26 (MISCELLANEOUS) IMPLANT
CONT SPEC 4OZ CLIKSEAL STRL BL (MISCELLANEOUS) IMPLANT
COVER BACK TABLE 60X90IN (DRAPES) ×2 IMPLANT
COVER WAND RF STERILE (DRAPES) ×4 IMPLANT
CUFF TOURN SGL QUICK 18X4 (TOURNIQUET CUFF) IMPLANT
CUFF TOURN SGL QUICK 24 (TOURNIQUET CUFF)
CUFF TRNQT CYL 24X4X16.5-23 (TOURNIQUET CUFF) IMPLANT
DRAPE 3/4 80X56 (DRAPES) ×2 IMPLANT
DRAPE EXTREMITY T 121X128X90 (DISPOSABLE) ×2 IMPLANT
DRAPE U-SHAPE 47X51 STRL (DRAPES) ×2 IMPLANT
ELECT REM PT RETURN 9FT ADLT (ELECTROSURGICAL) ×2
ELECTRODE REM PT RTRN 9FT ADLT (ELECTROSURGICAL) ×1 IMPLANT
GAUZE SPONGE 4X4 12PLY STRL (GAUZE/BANDAGES/DRESSINGS) ×2 IMPLANT
GAUZE SPONGE 4X4 12PLY STRL LF (GAUZE/BANDAGES/DRESSINGS) ×2 IMPLANT
GAUZE XEROFORM 1X8 LF (GAUZE/BANDAGES/DRESSINGS) IMPLANT
GLOVE BIO SURGEON STRL SZ7.5 (GLOVE) ×2 IMPLANT
GLOVE BIOGEL PI IND STRL 8 (GLOVE) ×1 IMPLANT
GLOVE BIOGEL PI INDICATOR 8 (GLOVE) ×1
GOWN STRL REUS W/ TWL XL LVL3 (GOWN DISPOSABLE) ×1 IMPLANT
GOWN STRL REUS W/TWL XL LVL3 (GOWN DISPOSABLE) ×2
LENEVA ADIPOSE MATRIX 1.5CC (Tissue) ×1 IMPLANT
MANIFOLD NEPTUNE II (INSTRUMENTS) ×2 IMPLANT
NEEDLE HYPO 25X1 1.5 SAFETY (NEEDLE) ×2 IMPLANT
NS IRRIG 1000ML POUR BTL (IV SOLUTION) IMPLANT
PACK BASIN DAY SURGERY FS (CUSTOM PROCEDURE TRAY) ×2 IMPLANT
PADDING CAST ABS 4INX4YD NS (CAST SUPPLIES) ×1
PADDING CAST ABS COTTON 4X4 ST (CAST SUPPLIES) ×1 IMPLANT
PENCIL BUTTON HOLSTER BLD 10FT (ELECTRODE) IMPLANT
SET IRRIG Y TYPE TUR BLADDER L (SET/KITS/TRAYS/PACK) IMPLANT
STAPLER VISISTAT 35W (STAPLE) IMPLANT
STOCKINETTE 6  STRL (DRAPES) ×1
STOCKINETTE 6 STRL (DRAPES) ×1 IMPLANT
SUCTION FRAZIER HANDLE 10FR (MISCELLANEOUS)
SUCTION TUBE FRAZIER 10FR DISP (MISCELLANEOUS) IMPLANT
SUT ETHILON 4 0 PS 2 18 (SUTURE) IMPLANT
SUT MNCRL AB 3-0 PS2 18 (SUTURE) IMPLANT
SUT MNCRL AB 4-0 PS2 18 (SUTURE) IMPLANT
SUT VIC AB 2-0 SH 27 (SUTURE)
SUT VIC AB 2-0 SH 27XBRD (SUTURE) IMPLANT
SYR BULB 3OZ (MISCELLANEOUS) ×2 IMPLANT
SYR CONTROL 10ML LL (SYRINGE) ×2 IMPLANT
TRAY DSU PREP LF (CUSTOM PROCEDURE TRAY) ×2 IMPLANT
UNDERPAD 30X30 (UNDERPADS AND DIAPERS) ×2 IMPLANT
YANKAUER SUCT BULB TIP NO VENT (SUCTIONS) ×2 IMPLANT

## 2019-05-13 NOTE — Interval H&P Note (Signed)
History and Physical Interval Note:  05/13/2019 1:46 PM  Jon Anderson  has presented today for surgery, with the diagnosis of DIABETIC ULCER.  The various methods of treatment have been discussed with the patient and family. After consideration of risks, benefits and other options for treatment, the patient has consented to  Procedure(s): DEBRIDEMENT WOUND (Right) FAT GRAFT APPLICATION (Right) as a surgical intervention.  The patient's history has been reviewed, patient examined, no change in status, stable for surgery.  I have reviewed the patient's chart and labs.  Questions were answered to the patient's satisfaction.     Evelina Bucy

## 2019-05-13 NOTE — Anesthesia Procedure Notes (Signed)
Procedure Name: MAC Date/Time: 05/13/2019 1:52 PM Performed by: Wanita Chamberlain, CRNA Pre-anesthesia Checklist: Patient identified, Timeout performed, Emergency Drugs available, Suction available and Patient being monitored Patient Re-evaluated:Patient Re-evaluated prior to induction Oxygen Delivery Method: Nasal cannula Induction Type: IV induction

## 2019-05-13 NOTE — Discharge Instructions (Signed)
Post Anesthesia Home Care Instructions  Activity: Get plenty of rest for the remainder of the day. A responsible individual must stay with you for 24 hours following the procedure.  For the next 24 hours, DO NOT: -Drive a car -Paediatric nurse -Drink alcoholic beverages -Take any medication unless instructed by your physician -Make any legal decisions or sign important papers.  Meals: Start with liquid foods such as gelatin or soup. Progress to regular foods as tolerated. Avoid greasy, spicy, heavy foods. If nausea and/or vomiting occur, drink only clear liquids until the nausea and/or vomiting subsides. Call your physician if vomiting continues.  Special Instructions/Symptoms: Your throat may feel dry or sore from the anesthesia or the breathing tube placed in your throat during surgery. If this causes discomfort, gargle with warm salt water. The discomfort should disappear within 24 hours.  ISurgical Wound Debridement, Care After This sheet gives you information about how to care for yourself after your procedure. Your health care provider may also give you more specific instructions. If you have problems or questions, contact your health care provider. What can I expect after the procedure? After the procedure, it is common to have:  Pain or soreness.  Fluid that leaks from the wound.  Stiffness.  A larger wound. This is because the dead tissue has been removed.  Follow these instructions at home: Medicines  Take over-the-counter and prescription medicines only as told by your health care provider.  If you were prescribed an antibiotic medicine, take it or apply it as told by your health care provider. Do not stop using the antibiotic even if you start to feel better. Wound care  Follow instructions from your health care provider about how to take care of your wound. Make sure you: ? Wash your hands with soap and water before and after you change your bandage (dressing).  If soap and water are not available, use hand sanitizer. ? Change your dressing as told by your health care provider. ? If your dressing is dry and stuck when you try to remove it, moisten or wet the dressing with saline or water so that it can be removed without harming your skin or wound tissue.  Check your wound for signs of infection every time your dressing is changed. Have someone help you do this if you are not able. Watch for: ? More redness, swelling, or pain. ? More fluid or blood. ? Warmth. ? Pus. ? A bad smell coming from your wound even after you clean it.  Do not take baths, swim, or use a hot tub until your health care provider approves. Ask your health care provider if you may take showers. You may only be allowed to take sponge baths. Activity  Do not drive or use heavy machinery while taking prescription pain medicine.  Return to your normal activities as told by your health care provider. Ask your health care provider what activities are safe for you. General instructions      Eat a healthy diet with lots of protein such as meats, cheese, nuts and beans. Ask your health care provider to suggest the best diet for you.  Do not use any products that contain nicotine or tobacco, such as cigarettes, e-cigarettes, and chewing tobacco. These can delay wound healing after surgery. If you need help quitting, ask your health care provider.  Keep all follow-up visits as told by your health care provider. This is important. Contact a health care provider if:  You have a fever.  Your pain medicine is not helping.  Your wound is more red and swollen.  You have increased bleeding.  You have pus coming from your wound.  You have a bad smell coming from your wound.  Your wound is not getting better after 1-2 weeks of treatment.  You develop a new medical condition, such as diabetes, peripheral vascular disease, or conditions that affect your defense system (immune  system). Summary  After the procedure, it is common to have pain, soreness, stiffness, or fluid leaking from the wound.  Follow instructions from your health care provider about how to take care of your wound.  Check your wound for signs of infection every time your dressing is changed.  Eat a healthy diet with lots of protein. Ask your health care provider to suggest the best diet for you.  Contact a health care provider if you have fever, more swelling and redness, increased bleeding, or a bad smell from the wound. This information is not intended to replace advice given to you by your health care provider. Make sure you discuss any questions you have with your health care provider. Document Revised: 03/19/2018 Document Reviewed: 03/19/2018 Elsevier Patient Education  [place].

## 2019-05-13 NOTE — Op Note (Signed)
Patient Name: Jon Anderson DOB: 15-Aug-1950  MRN: [id number]   Date of Service: 05/13/19   Surgeon: Dr. Hardie Pulley, DPM Assistants: None Pre-operative Diagnosis: ulcer right foot, fat pad atrophy Post-operative Diagnosis: same Procedures:             1) Debridement right foot ulcer  2) Injection of fat allograft Pathology/Specimens: * No specimens in log * Anesthesia: MAC/local Hemostasis: Anatomic Estimated Blood Loss: 2 ml Materials: None Medications: 10 ml Marcaine 1.7% plain Complications: None  Indications for Procedure:  This is a 69 y.o. 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 It was discussed he would benefit from debridement and fat graft supplementation. All risks, benefits, and alternatives of surgery were discussed. No guarantees given.   Procedure in Detail: Patient was identified in pre-operative holding area. Formal consent was signed and the right lower extremity was marked. Patient was brought back to the operating room and remained on his stretcher in the supine position. Anesthesia was induced.   The extremity was prepped and draped in the usual sterile fashion. Timeout was taken to confirm patient name, laterality, and procedure prior to incision. Attention was then directed to the right foot where a wound measuring 0.5x0.5 was encountered.  The wound was sharply excisionally debrided with a 15 blade. Debridement was performed to bleeding, viable wound base. The wound was debrided to the level of the subcutaneous tissue. Following debridement the wound measured 1x1.5.  1.5 ml of Leneva fat allograft was injected beneath the ulceration with a dorsal approach. A tissue plane was made with the needle prior to injection. The prominent bone at the area appeared reduced.  The foot was then dressed with bacitracin, 4x4, kerlix, and ACE bandage. Patient tolerated the procedure well.   Disposition: Following a period of post-operative monitoring,  patient will be transferred back home.

## 2019-05-14 NOTE — Transfer of Care (Signed)
Immediate Anesthesia Transfer of Care Note  Patient: Jon Anderson  Procedure(s) Performed: DEBRIDEMENT WOUND (Right ) FAT GRAFT APPLICATION (Right )  Patient Location: PACU  Anesthesia Type:MAC  Level of Consciousness: awake, alert , oriented and patient cooperative  Airway & Oxygen Therapy: Patient Spontanous Breathing and Patient connected to nasal cannula oxygen  Post-op Assessment: Report given to RN and Post -op Vital signs reviewed and stable  Post vital signs: Reviewed and stable  Last Vitals:  Vitals Value Taken Time  BP 129/73 05/13/19 1545  Temp 36.2 C 05/13/19 1545  Pulse 64 05/13/19 1545  Resp 14 05/13/19 1545  SpO2 95 % 05/13/19 1545    Last Pain:  Vitals:   05/13/19 1545  TempSrc:   PainSc: 0-No pain      Patients Stated Pain Goal: 5 (32/44/01 0272)  Complications: No apparent anesthesia complications

## 2019-05-15 ENCOUNTER — Other Ambulatory Visit: Payer: Self-pay

## 2019-05-15 ENCOUNTER — Ambulatory Visit (INDEPENDENT_AMBULATORY_CARE_PROVIDER_SITE_OTHER): Payer: Medicare Other | Admitting: Podiatry

## 2019-05-15 ENCOUNTER — Telehealth: Payer: Self-pay

## 2019-05-15 DIAGNOSIS — E08621 Diabetes mellitus due to underlying condition with foot ulcer: Secondary | ICD-10-CM | POA: Diagnosis not present

## 2019-05-15 DIAGNOSIS — L909 Atrophic disorder of skin, unspecified: Secondary | ICD-10-CM | POA: Diagnosis not present

## 2019-05-15 DIAGNOSIS — L97511 Non-pressure chronic ulcer of other part of right foot limited to breakdown of skin: Secondary | ICD-10-CM | POA: Diagnosis not present

## 2019-05-15 NOTE — Telephone Encounter (Signed)
POST OP CALL-    1) General condition stated by the patient:   2) Is the pt having pain?   3) Pain score:   4) Has the pt taken Rx'd pain medication, regularly or PRN?   5) Is the pain medication giving relief?  6) Any fever, chills, nausea, or vomiting, shortness of breath or tightness in calf?  7) Is the bandage clean, dry and intact?  8) Is there excessive tightness, bleeding or drainage coming through the bandage?  9) Did you understand all of the post op instruction sheet given?  10) Any questions or concerns regarding post op care/recovery?    Confirmed POV appointment with patient    Pt did not answer and voicemail was full/would not accept messages.

## 2019-05-15 NOTE — Anesthesia Postprocedure Evaluation (Signed)
Anesthesia Post Note  Patient: Jon Anderson  Procedure(s) Performed: DEBRIDEMENT WOUND (Right ) FAT GRAFT APPLICATION (Right )     Patient location during evaluation: PACU Anesthesia Type: General Level of consciousness: awake and alert Pain management: pain level controlled Vital Signs Assessment: post-procedure vital signs reviewed and stable Respiratory status: spontaneous breathing, nonlabored ventilation, respiratory function stable and patient connected to nasal cannula oxygen Cardiovascular status: blood pressure returned to baseline and stable Postop Assessment: no apparent nausea or vomiting Anesthetic complications: no    Last Vitals:  Vitals:   05/13/19 1445 05/13/19 1545  BP: 134/79 129/73  Pulse: 92 64  Resp: 18 14  Temp:  (!) 36.2 C  SpO2: 94% 95%    Last Pain:  Vitals:   05/13/19 1545  TempSrc:   PainSc: 0-No pain                 Barnet Glasgow

## 2019-05-16 ENCOUNTER — Telehealth: Payer: Self-pay | Admitting: Podiatry

## 2019-05-16 NOTE — Telephone Encounter (Signed)
Pt called asking if he could get a refill on pain medication DOS 05/14/19

## 2019-05-16 NOTE — Telephone Encounter (Signed)
Please advise. Thanks Pat Sires 

## 2019-05-20 MED ORDER — OXYCODONE HCL 5 MG PO TABS: 10.0000 mg | ORAL_TABLET | ORAL | 0 refills | Status: DC | PRN

## 2019-05-20 NOTE — Addendum Note (Signed)
Addended by: Hardie Pulley on: 05/20/2019 12:43 PM   Modules accepted: Orders

## 2019-05-21 ENCOUNTER — Telehealth: Payer: Self-pay

## 2019-05-21 NOTE — Telephone Encounter (Signed)
Pt did not answer and his voicemail box is full, unable to leave message. Call was to inform pt that Dr. March Rummage had filled his prescription.

## 2019-05-22 ENCOUNTER — Other Ambulatory Visit: Payer: Self-pay

## 2019-05-22 ENCOUNTER — Ambulatory Visit (INDEPENDENT_AMBULATORY_CARE_PROVIDER_SITE_OTHER): Payer: Medicare Other | Admitting: Podiatry

## 2019-05-22 DIAGNOSIS — E08621 Diabetes mellitus due to underlying condition with foot ulcer: Secondary | ICD-10-CM | POA: Diagnosis not present

## 2019-05-22 DIAGNOSIS — L909 Atrophic disorder of skin, unspecified: Secondary | ICD-10-CM

## 2019-05-22 DIAGNOSIS — L97511 Non-pressure chronic ulcer of other part of right foot limited to breakdown of skin: Secondary | ICD-10-CM

## 2019-05-26 ENCOUNTER — Ambulatory Visit (INDEPENDENT_AMBULATORY_CARE_PROVIDER_SITE_OTHER): Payer: Medicare Other

## 2019-05-26 ENCOUNTER — Ambulatory Visit (INDEPENDENT_AMBULATORY_CARE_PROVIDER_SITE_OTHER): Payer: Medicare Other | Admitting: Podiatry

## 2019-05-26 ENCOUNTER — Other Ambulatory Visit: Payer: Self-pay

## 2019-05-26 DIAGNOSIS — L97512 Non-pressure chronic ulcer of other part of right foot with fat layer exposed: Secondary | ICD-10-CM

## 2019-05-26 DIAGNOSIS — L97511 Non-pressure chronic ulcer of other part of right foot limited to breakdown of skin: Secondary | ICD-10-CM

## 2019-05-26 DIAGNOSIS — E08621 Diabetes mellitus due to underlying condition with foot ulcer: Secondary | ICD-10-CM

## 2019-05-26 DIAGNOSIS — E0843 Diabetes mellitus due to underlying condition with diabetic autonomic (poly)neuropathy: Secondary | ICD-10-CM

## 2019-05-29 ENCOUNTER — Telehealth: Payer: Self-pay | Admitting: Cardiovascular Disease

## 2019-05-29 ENCOUNTER — Encounter: Payer: Medicare Other | Admitting: Podiatry

## 2019-05-29 NOTE — Telephone Encounter (Signed)
Patient called stating he is feeling pins and needles on right foot, he wants to know if this can be caused by poor circulation.

## 2019-05-29 NOTE — Telephone Encounter (Signed)
Attempted to call patient. VM is full, unable to leave a message.

## 2019-05-30 ENCOUNTER — Ambulatory Visit (INDEPENDENT_AMBULATORY_CARE_PROVIDER_SITE_OTHER): Payer: Medicare Other | Admitting: Podiatry

## 2019-05-30 ENCOUNTER — Other Ambulatory Visit: Payer: Self-pay

## 2019-05-30 DIAGNOSIS — L909 Atrophic disorder of skin, unspecified: Secondary | ICD-10-CM

## 2019-05-30 DIAGNOSIS — L97512 Non-pressure chronic ulcer of other part of right foot with fat layer exposed: Secondary | ICD-10-CM | POA: Diagnosis not present

## 2019-05-30 DIAGNOSIS — E1151 Type 2 diabetes mellitus with diabetic peripheral angiopathy without gangrene: Secondary | ICD-10-CM

## 2019-05-30 MED ORDER — CLINDAMYCIN HCL 150 MG PO CAPS: 150.0000 mg | ORAL_CAPSULE | Freq: Two times a day (BID) | ORAL | 0 refills | Status: DC

## 2019-05-30 MED ORDER — TRAMADOL HCL 50 MG PO TABS: 50.0000 mg | ORAL_TABLET | Freq: Three times a day (TID) | ORAL | 0 refills | Status: DC | PRN

## 2019-05-30 NOTE — Progress Notes (Signed)
  Subjective:  Patient ID: Jon Anderson, male    DOB: 17-Apr-1950,  MRN: [id number]  Chief Complaint  Patient presents with  . Diabetes    Pt is here for a diabetic ulcer f/u of the right foot, pt states that he is in a lot of pain, pt shows no signs of infection, pt has also been wearing cam boot, pain is elevated to the touch.    69 y.o. male presents for wound care. Hx confirmed with patient.  Objective:  Physical Exam: Wound Location: right 5th MPJ Wound Measurement: 1x1.5 Wound Base: Granular/Healthy Peri-wound: Normal Exudate: None: wound tissue dry wound without warmth, erythema, signs of acute infection  Assessment:   1. 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 Fat pad atrophy of foot     Plan:  Patient was evaluated and treated and all questions answered.  Ulcer Right foot -Presents 2 days s/p debridement and fat graft injection. No signs of infection but with severe pain. Well padded dressing applied. F/u next week as scheduled.

## 2019-05-30 NOTE — Progress Notes (Signed)
  Subjective:  Patient ID: Jon Anderson, male    DOB: [birth year],  MRN: [id number]  Chief Complaint  Patient presents with  . Routine Post Op    POV#1 DOS 02.03.2021 Wound Debridement and application fat graft rt. Pt states he is in severe pain, he is taking his pain medication but it does not provide complete relief.       DOS: 05/14/19 Procedure: Debridement right foot wound, injection of fat allograft   69 y.o. male presents with the above complaint. History confirmed with patient.   Objective:  Physical Exam: tenderness at the surgical site, local edema noted and calf supple, nontender. Wound right foot measuring 1x1 without warmth, erythema, signs of active infection. Still incredibly tender to palpation. The foot is warm. He has a palpable DP pulse. No ascending cellulitis or signs of infection.  Assessment:   1. Diabetic ulcer of other part of right foot associated with diabetes mellitus due to underlying condition, limited to breakdown of skin ([place])   2. Fat pad atrophy of foot     Plan:  Patient was evaluated and treated and all questions answered.  Ulcer right foot -Wound not debrided today -Dressed with foam border dressing and antibiotic ointment. -No signs of acute infection noted.  Return in about 1 week (around 05/29/2019).

## 2019-05-30 NOTE — Progress Notes (Signed)
  Subjective:  Patient ID: DAIKI DICOSTANZO, male    DOB: 04-09-1951,  MRN: [id number]  Chief Complaint  Patient presents with  . Routine Post Op    POV#2 DOS 05/14/2019 WOUND DEBRIDEMENT AND APPLICATION FAT GRAFT RT.   DOS: 05/14/19 Procedure: Debridement right foot wound, right foot fat allograft   69 y.o. male presents with the above complaint. History confirmed with patient.   Objective:  Physical Exam: palpable DP pulse RLE, dorsal foot edema. Right foot ulceration full thickness with slight overlying eschar. Severe pain to palpation to even the lightest touch. No excessive warmth or erythema. Small pocket of purulence present underneath the ulceration. No ascending cellulitis. No periwound erythema. Assessment:   1. Ulcer of right foot with fat layer exposed ([place])   2. Fat pad atrophy of foot   3. Diabetes mellitus type 2 with peripheral artery disease ([place])    Plan:  Patient was evaluated and treated and all questions answered.  Right Foot Ulcer  -Continues to complain of severe pain. The wound does appear to be worsened compared to previous with greater depth. The wound was minimally debrided as patient could not tolerate much debridement. There was a small collection of purulence underneath the wound which was cultured. Rx Clindamycin. -He does have a palpable DP pulse, but slow wound healing and pain even to dorsal foot palpation. I think he would benefit from possible vascular intervention. Will have patient see Dr. Rolley Sims again for evaluation.  Return in about 1 week (around 06/06/2019).

## 2019-05-31 NOTE — Progress Notes (Signed)
HPI: 69 y.o. male presenting today as a patient of Dr. Eleanora Neighbor for evaluation of severe right foot pain. Patient has had increased pain since he was last seen in the office on 05/01/2019 under the care of Dr. March Rummage. He states that the pain is 10/10. He presents for further treatment evaluation  Past Medical History:  Diagnosis Date  . Anemia of chronic disease   . CAD (coronary artery disease)    a.  Myoview 4/11: EF 53%, no scar or ischemia   c. MV 2012 Nl perfusion, apical thinning.  No ischemia or scar.  EF 49%, appears greater by visual estimate.;  d.  Dob stress echo 12/13:  Negative Dob stress echo. There is no evidence of ischemia.  The LVF is normal. b. Normal cors 2016.  . Carotid stenosis    a. <02% RICA, >72% LICA by duplex 08/3662  . Chronic chest pain    occ  . ESRD (end stage renal disease) on dialysis ([place])    M-W-F  . GERD (gastroesophageal reflux disease)   . HNP (herniated nucleus pulposus), lumbar   . HTN (hypertension)    echo 3/10: EF 60%, LAE  . Hyperlipidemia   . Nephrolithiasis    "passed them all"  . Peripheral arterial disease ([place])    a. s/p PTCA to L SFA.  Marland Kitchen Pneumonia yrs ago  . Restless legs   . Sciatic leg pain   . Sleep apnea    no cpap, needs to reschedule appointment to set up aquiring cpap  . Snores    a. presumed OSA, pt has refused sleep eval in past.  . Type II diabetes mellitus ([place])    no longer on medications, checks blood glucose at home  . Urinary frequency      Physical Exam: General: The patient is alert and oriented x3 in no acute distress.  Dermatology: Skin is warm, dry and supple bilateral lower extremities. Ulcer noted to the right subfifth MTPJ measuring approximately 0.6 x 0.6 x 0.1 cm. Wound appears stable in nature. No malodor. Negative for any significant drainage. Periwound integrity is intact. There is no exposed bone muscle tendon ligament or joint.  Vascular: Diminished pedal pulses bilaterally. No edema or erythema  noted.   Neurological: Epicritic and protective threshold grossly intact bilaterally.   Musculoskeletal Exam: Range of motion within normal limits to all pedal and ankle joints bilateral. Muscle strength 5/5 in all groups bilateral. Hypersensitivity to touch throughout the right lower extremity  Radiographic Exam:  Normal osseous mineralization. Joint spaces preserved. No fracture/dislocation/boney destruction. Negative for any osteolytic destruction or cortical erosion that would be consistent with OM  Assessment: 1. Peripheral vascular disease 2. Ulcer subfifth MTPJ right foot secondary to diabetes mellitus 3. Critical limb ischemia right lower extremity 4. Type II DM   Plan of Care:  1. Patient evaluated. X-Rays reviewed.  2. Medically necessary excisional debridement including subcutaneous tissue was performed using a tissue nipper. Excisional debridement of all necrotic nonviable tissue down to healthy bleeding viable tissue was performed with post debridement measurement same as pre- 3. Patient has been followed in the past by interventional cardiology for his critical limb ischemia. Recommend follow-up with vascular for possible intervention and circulatory optimization 4. Continue diabetic shoes to the left foot. Continue weightbearing in the cam boot right. 5. Return to clinic on neck scheduled appointment with Dr. Lauro Regulus, DPM [facility]  Dr. Edrick Kins,  DPM    [address], [place] [address]                Office [phone]  Fax [phone]

## 2019-06-02 ENCOUNTER — Telehealth: Payer: Self-pay | Admitting: *Deleted

## 2019-06-02 DIAGNOSIS — D689 Coagulation defect, unspecified: Secondary | ICD-10-CM

## 2019-06-02 DIAGNOSIS — E1151 Type 2 diabetes mellitus with diabetic peripheral angiopathy without gangrene: Secondary | ICD-10-CM

## 2019-06-02 DIAGNOSIS — R0989 Other specified symptoms and signs involving the circulatory and respiratory systems: Secondary | ICD-10-CM

## 2019-06-02 DIAGNOSIS — L97512 Non-pressure chronic ulcer of other part of right foot with fat layer exposed: Secondary | ICD-10-CM

## 2019-06-02 DIAGNOSIS — L909 Atrophic disorder of skin, unspecified: Secondary | ICD-10-CM

## 2019-06-02 LAB — WOUND CULTURE
GRAM STAIN:: NONE SEEN
MICRO NUMBER:: 10167733
RESULT:: NO GROWTH
SPECIMEN QUALITY:: ADEQUATE

## 2019-06-02 NOTE — Telephone Encounter (Signed)
-----   Message from Evelina Bucy, DPM sent at 05/30/2019  4:13 PM EST ----- Refer to Dr. Rolley Sims

## 2019-06-02 NOTE — Telephone Encounter (Signed)
Referral to [facility].

## 2019-06-05 ENCOUNTER — Ambulatory Visit (INDEPENDENT_AMBULATORY_CARE_PROVIDER_SITE_OTHER): Payer: Medicare Other | Admitting: Podiatry

## 2019-06-05 ENCOUNTER — Telehealth: Payer: Self-pay | Admitting: Podiatry

## 2019-06-05 ENCOUNTER — Other Ambulatory Visit: Payer: Self-pay

## 2019-06-05 DIAGNOSIS — L909 Atrophic disorder of skin, unspecified: Secondary | ICD-10-CM

## 2019-06-05 DIAGNOSIS — L97512 Non-pressure chronic ulcer of other part of right foot with fat layer exposed: Secondary | ICD-10-CM

## 2019-06-05 DIAGNOSIS — E1151 Type 2 diabetes mellitus with diabetic peripheral angiopathy without gangrene: Secondary | ICD-10-CM

## 2019-06-05 NOTE — Telephone Encounter (Signed)
Pt called to say that his wound on hid surgry foot was opening up and he was in  A lot of pain requested to come see Dr.Price today 06/05/19

## 2019-06-05 NOTE — Progress Notes (Signed)
  Subjective:  Patient ID: Jon Anderson, male    DOB: 07/12/1950,  MRN: [id number]  Chief Complaint  Patient presents with  . Wound Check    POV#3 DOS 02.03.2021 WOUND DEBRIDEMENT AND APPLICATION OF FAT GRAFT RT. Pt states extreme pain beginning 2 days ago. "Feels like it is opening up".   DOS: 05/14/19 Procedure: Debridement right foot wound, right foot fat allograft   69 y.o. male presents with the above complaint. History confirmed with patient.   Objective:  Physical Exam: palpable DP pulse RLE, dorsal foot edema. Right foot ulceration full thickness with fibrogranular base 1x1. No warmth erythema signs of infection. Assessment:   1. Ulcer of right foot with fat layer exposed ([place])   2. Fat pad atrophy of foot   3. Diabetes mellitus type 2 with peripheral artery disease ([place])    Plan:  Patient was evaluated and treated and all questions answered.  Right Foot Ulcer  -Wound culture no growth. -Pending vascular evaluation for slow healing wound. -No wound debridement today. Only supeficial debridement of redundant skin -Dispense forefoot offloading shoe  No follow-ups on file.

## 2019-06-06 ENCOUNTER — Telehealth: Payer: Self-pay | Admitting: *Deleted

## 2019-06-06 ENCOUNTER — Encounter: Payer: Medicare Other | Admitting: Podiatry

## 2019-06-06 MED ORDER — SANTYL 250 UNIT/GM EX OINT: 1.0000 "application " | TOPICAL_OINTMENT | Freq: Every day | CUTANEOUS | 0 refills | Status: DC

## 2019-06-06 NOTE — Telephone Encounter (Signed)
Faxed completed form, clinicals 06/05/2019 and demographics to [place].

## 2019-06-08 ENCOUNTER — Other Ambulatory Visit: Payer: Self-pay | Admitting: Podiatry

## 2019-06-10 ENCOUNTER — Telehealth: Payer: Self-pay | Admitting: Cardiovascular Disease

## 2019-06-10 ENCOUNTER — Other Ambulatory Visit: Payer: Self-pay | Admitting: Podiatry

## 2019-06-10 MED ORDER — OXYCODONE HCL 5 MG PO TABS: 10.0000 mg | ORAL_TABLET | ORAL | 0 refills | Status: DC | PRN

## 2019-06-10 NOTE — Telephone Encounter (Signed)
Patient calling to request his daughter Jon Anderson come to his appointment 3/5, because he is in a wheelchair.

## 2019-06-10 NOTE — Telephone Encounter (Signed)
Spoke with pt, no other reason except for his daughter to push him in the wheelchair. Explained to patient if she can get him to the door we will get him back to the room and out. Patient voiced understanding

## 2019-06-10 NOTE — Progress Notes (Unsigned)
Sent refill to the pharmacy  

## 2019-06-11 ENCOUNTER — Other Ambulatory Visit: Payer: Self-pay

## 2019-06-11 ENCOUNTER — Encounter: Payer: Self-pay | Admitting: Cardiovascular Disease

## 2019-06-11 ENCOUNTER — Ambulatory Visit (INDEPENDENT_AMBULATORY_CARE_PROVIDER_SITE_OTHER): Payer: Medicare Other | Admitting: Cardiovascular Disease

## 2019-06-11 VITALS — BP 148/60 | HR 80 | Temp 98.4°F | Ht 71.0 in | Wt 166.0 lb

## 2019-06-11 DIAGNOSIS — I6529 Occlusion and stenosis of unspecified carotid artery: Secondary | ICD-10-CM | POA: Diagnosis not present

## 2019-06-11 DIAGNOSIS — Z951 Presence of aortocoronary bypass graft: Secondary | ICD-10-CM

## 2019-06-11 DIAGNOSIS — I10 Essential (primary) hypertension: Secondary | ICD-10-CM

## 2019-06-11 DIAGNOSIS — I6522 Occlusion and stenosis of left carotid artery: Secondary | ICD-10-CM

## 2019-06-11 DIAGNOSIS — I739 Peripheral vascular disease, unspecified: Secondary | ICD-10-CM

## 2019-06-11 DIAGNOSIS — E785 Hyperlipidemia, unspecified: Secondary | ICD-10-CM

## 2019-06-11 NOTE — Assessment & Plan Note (Signed)
History of carotid artery disease status post carotid Doppler studies performed 06/18/2018 revealing moderate left ICA stenosis.  Doppler studies will be repeated this month

## 2019-06-11 NOTE — Assessment & Plan Note (Signed)
History of CAD status post off-pump LIMA to the LAD at [facility] in October 2018.  He underwent cardiac catheterization 04/27/2017 by Dr. Ellyn Hack revealing a patent LIMA to the LAD with otherwise noncritical disease and normal LV function.

## 2019-06-11 NOTE — Telephone Encounter (Signed)
Dr. March Rummage please advice

## 2019-06-11 NOTE — Assessment & Plan Note (Addendum)
History of hyperlipidemia on statin therapy with lipid profile performed 07/18/2018 revealing total cholesterol 130, LDL 68 and HDL 47.

## 2019-06-11 NOTE — Assessment & Plan Note (Signed)
History of essential hypertension with blood pressure measured today 148/60.  He is not on any antihypertensive medications.

## 2019-06-11 NOTE — Assessment & Plan Note (Signed)
History of peripheral arterial disease with high-frequency signal in the distal left SFA in the past with left calf claudication status post left SFA PTA 12/11/2013.  His most recent lower extremity arterial Doppler studies performed 04/16/2019 revealed noncompressible ABIs with mild right popliteal disease and no evidence of left SFA disease.  He does have issues with his right foot status post podiatry treatment of calluses with implantation of collagen which has left him unable to walk but I do not think that this is vascular in nature.

## 2019-06-11 NOTE — Progress Notes (Signed)
06/11/2019 Jon Anderson   [birth year]  [id number]  Primary Physician [name], MD Primary Cardiologist: Lorretta Harp MD Jon Anderson, [place]  HPI:  Jon Anderson is a 69 y.o.   mildly overweight married African American male father of 2, grandfather Of one grandchild who Works as a Sports coach. Hewasreferred by Dr. Scherrie November for peripheral vascular evaluation prior to elective foot surgery. His primary care physician is Dr. Mellody Drown.I last saw him in the office  04/16/2018. His cardiac risk factors include hypertension, diabetes and hyperlipidemia. He does not smoke. His sister died of a myocardial infarction at age 54. He has never had a heart attack or stroke and has had cardiac catheterization remotely that showed noncritical CAD. He does get fairly frequent chest pain and shortness of breath however. He has chronic renal sufficiency with creatinines in the mid 3 range thought to chronic kidney. Lower extremity arterial Doppler studies performed in the office 09/01/13 revealed a right ABI 1.1 the left of 0.76 with a high-frequency signal in the distal left SFA. He does complain of left calf claudication which is lifestyle limiting and affecting his ability to perform work.. There is no history of nonhealing wounds. Recent carotid Dopplers showed moderately severe left internal carotid artery stenosis.  Since I saw him last he has gone on dialysis February 2016. He also underwent off-pump LIMA insertion at [facility] in October of last year because of a high grade lesion that was discovered during a workup for renal transplant. He will cardiac catheterization by Dr. Ellyn Hack 04/27/17 revealed a patent LIMA to the LAD with otherwise noncritical disease and normal LV function. He currently denies chest pain. He is scheduled for laminectomy on 07/03/17. Carotid Dopplers performed at [facility] imaging showed moderately severe left ICA stenosis which when compared to  the Dopplers performed in our office 10/15/13, did not appear significantly different. However, given the severity of the lesion I n the 70-90%range CTA of his neck revealing no significant obstructive disease in his carotid circulation.  Dr. Milinda Pointer continues to work with his calluses.    He is currently in a wheelchair and unable to bear weight on his right foot.  He had recent lower extremity arterial Doppler studies performed 04/15/2019 which revealed no significant obstructive disease.  Carotid Dopplers performed in March of last year did show a moderately severe left ICA stenosis.   Current Meds  Medication Sig  . acetaminophen (TYLENOL) 500 MG tablet Take 1,000 mg by mouth every 4 (four) hours as needed for moderate pain or headache.   Marland Kitchen aspirin EC 81 MG tablet Take 1 tablet (81 mg total) by mouth daily.  Marland Kitchen atorvastatin (LIPITOR) 40 MG tablet Take 1 tablet (40 mg total) by mouth daily at 6 PM.  . calcium acetate (PHOSLO) 667 MG capsule Take 3 capsules (2,001 mg total) by mouth 3 (three) times daily with meals. (Patient taking differently: Take 1,334 mg by mouth 2 (two) times daily. )  . cinacalcet (SENSIPAR) 60 MG tablet Take 60 mg by mouth. Takes Monday, Wednesday, friday  . clindamycin (CLEOCIN) 150 MG capsule Take 1 capsule (150 mg total) by mouth 2 (two) times daily.  . clopidogrel (PLAVIX) 75 MG tablet Take 75 mg by mouth daily.  . clotrimazole (LOTRIMIN) 1 % cream Apply between toes and under toes twice daily for 4 weeks.  . collagenase (SANTYL) ointment Apply 1 application topically daily. Wound Measurement 1.0 x 1.0cm  . Etelcalcetide HCl (  PARSABIV IV) Etelcalcetide (Parsabiv)  . gabapentin (NEURONTIN) 100 MG capsule Take 100 mg by mouth 2 (two) times daily.   Marland Kitchen loperamide (IMODIUM) 2 MG capsule Take 2 mg by mouth as needed for diarrhea or loose stools.  . multivitamin (RENA-VIT) TABS tablet Take 1 tablet by mouth at bedtime.  . mupirocin ointment (BACTROBAN) 2 % Apply to right foot  once daily.  . nitroGLYCERIN (NITROSTAT) 0.4 MG SL tablet Place 1 tablet (0.4 mg total) under the tongue every 5 (five) minutes as needed for chest pain.  Jon Anderson ULTRA test strip   . oxyCODONE (OXY IR/ROXICODONE) 5 MG immediate release tablet Take 2 tablets (10 mg total) by mouth every 4 (four) hours as needed for moderate pain.  . pantoprazole (PROTONIX) 40 MG tablet Take 40 mg by mouth daily.  . polyethylene glycol (MIRALAX / GLYCOLAX) packet Take 17 g by mouth daily as needed for moderate constipation.  . sucralfate (CARAFATE) 1 g tablet Take 1 g by mouth 2 (two) times daily.  . traMADol (ULTRAM) 50 MG tablet TAKE 1 TABLET (50 MG TOTAL) BY MOUTH EVERY 8 (EIGHT) HOURS AS NEEDED FOR UP TO 5 DAYS.  . [DISCONTINUED] cephALEXin (KEFLEX) 500 MG capsule Take 500 mg by mouth 2 (two) times daily. finishes today 05-08-2019     Allergies  Allergen Reactions  . Kiwi Extract Itching, Swelling and Other (See Comments)    Lips and face swell- breathing not affected  . Flexeril [Cyclobenzaprine]     Hands become flimsy, can not hold things  . Other Other (See Comments)  . Tape Other (See Comments)    "Plastic" tape causes blisters!!    Social History   Socioeconomic History  . Marital status: Married    Spouse name: Not on file  . Number of children: Not on file  . Years of education: Not on file  . Highest education level: Not on file  Occupational History  . Not on file  Tobacco Use  . Smoking status: Former Smoker    Packs/day: 1.00    Years: 2.00    Pack years: 2.00    Types: Cigarettes  . Smokeless tobacco: Never Used  . Tobacco comment: quit smoking 40 yrs ago  Substance and Sexual Activity  . Alcohol use: No    Alcohol/week: 0.0 standard drinks  . Drug use: No  . Sexual activity: Yes    Birth control/protection: None  Other Topics Concern  . Not on file  Social History Narrative   The patient is a Retail buyer.  He is married and has 2 grown children.  Lives in [place] with his  wife.  Denies tobacco, alcohol or IV drug abuse or marijuana or cocaine intake.    Social Determinants of Health   Financial Resource Strain:   . Difficulty of Paying Living Expenses: Not on file  Food Insecurity:   . Worried About Charity fundraiser in the Last Year: Not on file  . Ran Out of Food in the Last Year: Not on file  Transportation Needs:   . Lack of Transportation (Medical): Not on file  . Lack of Transportation (Non-Medical): Not on file  Physical Activity:   . Days of Exercise per Week: Not on file  . Minutes of Exercise per Session: Not on file  Stress:   . Feeling of Stress : Not on file  Social Connections:   . Frequency of Communication with Friends and Family: Not on file  . Frequency of Social Gatherings  with Friends and Family: Not on file  . Attends Religious Services: Not on file  . Active Member of Clubs or Organizations: Not on file  . Attends Archivist Meetings: Not on file  . Marital Status: Not on file  Intimate Partner Violence:   . Fear of Current or Ex-Partner: Not on file  . Emotionally Abused: Not on file  . Physically Abused: Not on file  . Sexually Abused: Not on file     Review of Systems: General: negative for chills, fever, night sweats or weight changes.  Cardiovascular: negative for chest pain, dyspnea on exertion, edema, orthopnea, palpitations, paroxysmal nocturnal dyspnea or shortness of breath Dermatological: negative for rash Respiratory: negative for cough or wheezing Urologic: negative for hematuria Abdominal: negative for nausea, vomiting, diarrhea, bright red blood per rectum, melena, or hematemesis Neurologic: negative for visual changes, syncope, or dizziness All other systems reviewed and are otherwise negative except as noted above.    Blood pressure (!) 148/60, pulse 80, temperature 98.4 F (36.9 C), height 5\' 11"  (1.803 m), weight 166 lb (75.3 kg), SpO2 94 %.  General appearance: alert and no  distress Neck: no adenopathy, no JVD, supple, symmetrical, trachea midline, thyroid not enlarged, symmetric, no tenderness/mass/nodules and Bilateral carotid bruits Lungs: clear to auscultation bilaterally Heart: regular rate and rhythm, S1, S2 normal, no murmur, click, rub or gallop Extremities: extremities normal, atraumatic, no cyanosis or edema Pulses: 2+ and symmetric Skin: Skin color, texture, turgor normal. No rashes or lesions Neurologic: Alert and oriented X 3, normal strength and tone. Normal symmetric reflexes. 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  He underwent cardiac catheterization 04/27/2017 by Dr. Ellyn Hack revealing a patent LIMA to the LAD with otherwise noncritical disease and normal LV function.  Hyperlipidemia LDL goal <70 History of hyperlipidemia on statin therapy with lipid profile performed 07/18/2018 revealing total cholesterol 130, LDL 68 and HDL 47.  Essential hypertension History of essential hypertension with blood pressure measured today 148/60.  He is not on any antihypertensive medications.  Carotid artery disease (H[facility]) History of carotid artery disease status post carotid Doppler studies performed 06/18/2018 revealing moderate left ICA stenosis.  Doppler studies will be repeated this month  PVD (peripheral vascular disease) ([place]) History of peripheral arterial disease with high-frequency signal in the distal left SFA in the past with left calf claudication status post left SFA PTA 12/11/2013.  His most recent lower extremity arterial Doppler studies performed 04/16/2019 revealed noncompressible ABIs with mild right popliteal disease and no evidence of left SFA disease.  He does have issues with his right foot status post podiatry treatment of calluses with implantation of collagen which has left  him unable to walk but I do not think that this is vascular in nature.      Lorretta Harp MD FACP,FACC,FAHA, [facility] 06/11/2019 4:44 PM

## 2019-06-11 NOTE — Patient Instructions (Signed)
Medication Instructions:  Your physician recommends that you continue on your current medications as directed. Please refer to the Current Medication list given to you today.  If you need a refill on your cardiac medications before your next appointment, please call your pharmacy.   Lab work: NONE  Testing/Procedures: Your physician has requested that you have a carotid duplex. This test is an ultrasound of the carotid arteries in your neck. It looks at blood flow through these arteries that supply the brain with blood. Allow one hour for this exam. There are no restrictions or special instructions.  AND IN ONE YEAR Your physician has requested that you have a lower extremity arterial exercise duplex. During this test, exercise and ultrasound are used to evaluate arterial blood flow in the legs. Allow one hour for this exam. There are no restrictions or special instructions.  AND  Your physician has requested that you have an ankle brachial index (ABI). During this test an ultrasound and blood pressure cuff are used to evaluate the arteries that supply the arms and legs with blood. Allow thirty minutes for this exam. There are no restrictions or special instructions.  Follow-Up: At [facility], you and your health needs are our priority.  As part of our continuing mission to provide you with exceptional heart care, we have created designated Provider Care Teams.  These Care Teams include your primary Cardiologist (physician) and Advanced Practice Providers (APPs -  Physician Assistants and Nurse Practitioners) who all work together to provide you with the care you need, when you need it. You may see [name], MD or one of the following Advanced Practice Providers on your designated Care Team:    Kerin Ransom, PA-C  [place], [place]  Coletta Memos, [place] Your physician wants you to follow-up in: 1 year with Dr. Gwenlyn Found. You will receive a reminder letter in the mail two months in  advance. If you don't receive a letter, please call our office to schedule the follow-up appointment.

## 2019-06-12 ENCOUNTER — Encounter: Payer: Medicare Other | Admitting: Podiatry

## 2019-06-12 ENCOUNTER — Ambulatory Visit (INDEPENDENT_AMBULATORY_CARE_PROVIDER_SITE_OTHER): Payer: Medicare Other

## 2019-06-12 ENCOUNTER — Ambulatory Visit (INDEPENDENT_AMBULATORY_CARE_PROVIDER_SITE_OTHER): Payer: Medicare Other | Admitting: Podiatry

## 2019-06-12 DIAGNOSIS — L909 Atrophic disorder of skin, unspecified: Secondary | ICD-10-CM

## 2019-06-12 DIAGNOSIS — L97512 Non-pressure chronic ulcer of other part of right foot with fat layer exposed: Secondary | ICD-10-CM

## 2019-06-12 DIAGNOSIS — R0989 Other specified symptoms and signs involving the circulatory and respiratory systems: Secondary | ICD-10-CM | POA: Diagnosis not present

## 2019-06-12 NOTE — Progress Notes (Signed)
  Subjective:  Patient ID: Jon Anderson, male    DOB: [birth year],  MRN: [id number]  Chief Complaint  Patient presents with  . Routine Post Op    POV#4 DOS 02.03.2021 WOUND DEBRIDEMENT AND APPLICATION FAT GRAFT RT. Pt states still having significant pain in his wound. Denies fever/chills/nausea/vomiting.   DOS: 05/14/19 Procedure: Debridement right foot wound, right foot fat allograft   69 y.o. male presents with the above complaint. History confirmed with patient.   Objective:  Physical Exam: palpable DP pulse RLE, dorsal foot edema. Right foot ulceration full thickness fibrogranular base, unable to fully appreciate depth. Measuring 0.6x1 Assessment:   1. Ulcer of right foot with fat layer exposed ([place])   2. Fat pad atrophy of foot   3. Diminished pulses in lower extremity    Plan:  Patient was evaluated and treated and all questions answered.  Right Foot Ulcer  -The wound does appear slight improved today  -He continues to have severe pain to even the slightest touch. This does appear to be slightly out of proportion to injury. His pain makes it hard to fully debride the wound. We have previously ordered Santyl but he did not fill it due to cost. -The redundant skin was gently debrided, wound base only minimally debrided due to patient's pain. -There are no signs of infection of the wound today. -XR show no osseous destruction. Significant vascular calcifications. -Mepilex border dressing and iodosorb applied. -Saw Dr. Rolley Sims who did not believe that vascular intervention was indicated.  31 minutes spent on the encounter including care, chart review, documentation on the day of the encounter.  No follow-ups on file.

## 2019-06-16 ENCOUNTER — Encounter (HOSPITAL_COMMUNITY): Payer: Medicare Other

## 2019-06-20 ENCOUNTER — Other Ambulatory Visit: Payer: Self-pay

## 2019-06-20 ENCOUNTER — Ambulatory Visit (INDEPENDENT_AMBULATORY_CARE_PROVIDER_SITE_OTHER): Payer: Medicare Other | Admitting: Podiatry

## 2019-06-20 VITALS — Temp 98.3°F

## 2019-06-20 DIAGNOSIS — L97512 Non-pressure chronic ulcer of other part of right foot with fat layer exposed: Secondary | ICD-10-CM

## 2019-06-20 MED ORDER — OXYCODONE HCL 5 MG PO TABS: 10.0000 mg | ORAL_TABLET | ORAL | 0 refills | Status: DC | PRN

## 2019-06-20 NOTE — Addendum Note (Signed)
Addended by: Hardie Pulley on: 06/20/2019 01:53 PM   Modules accepted: Orders

## 2019-06-20 NOTE — Progress Notes (Signed)
  Subjective:  Patient ID: Jon Anderson, male    DOB: 12/30/1950,  MRN: [id number]  Chief Complaint  Patient presents with  . Wound Check    R foot. Pt stated, "It hurts all the time - most of my pain is severe. No foul odors or unusual drainage. No fever/chills/N&V".  . Diabetes    Pt could not recall his most recent HgbA1c result. Last glucose level per pt = 105mg /dL.   DOS: 05/14/19 Procedure: Debridement right foot wound, right foot fat allograft   69 y.o. male presents with the above complaint. History confirmed with patient.   Objective:  Physical Exam: palpable DP pulse RLE, dorsal foot edema. Right foot ulceration full thickness fibrogranular base, unable to fully appreciate depth. Measures 2x1 today.  Assessment:   1. 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  Right Foot Ulcer  -The wound does appear worsened compared to previous. -He continues to have severe pain to even the slightest touch. -No debridement today per patient request due to the pain of debridement. -No vascular intervention indicated per Dr. Rolley Sims -Will place referral to wound care center for help in healing of the ulceration. Unclear if he would be a candidate for HBOT  No follow-ups on file.

## 2019-06-25 ENCOUNTER — Telehealth: Payer: Self-pay | Admitting: *Deleted

## 2019-06-25 DIAGNOSIS — E1151 Type 2 diabetes mellitus with diabetic peripheral angiopathy without gangrene: Secondary | ICD-10-CM

## 2019-06-25 DIAGNOSIS — L97512 Non-pressure chronic ulcer of other part of right foot with fat layer exposed: Secondary | ICD-10-CM

## 2019-06-25 DIAGNOSIS — L909 Atrophic disorder of skin, unspecified: Secondary | ICD-10-CM

## 2019-06-25 DIAGNOSIS — R0989 Other specified symptoms and signs involving the circulatory and respiratory systems: Secondary | ICD-10-CM

## 2019-06-25 NOTE — Telephone Encounter (Signed)
Reviewed LOV 06/20/2019 and in Dr. Eleanora Neighbor Plan he had wanted pt to be referred to [facility]. Faxed required form, clinicals and demographics to [facility].

## 2019-06-25 NOTE — Telephone Encounter (Signed)
Patient is calling for the status of a referral to a wound care specialist that was mentioned by Dr March Rummage last office visit. Please call.

## 2019-06-25 NOTE — Telephone Encounter (Signed)
I spoke with pt and informed I would be sending his [facility] referral in a matter of moments the orders to do so had be slow to reach me.

## 2019-06-25 NOTE — Telephone Encounter (Signed)
Noted thanks °

## 2019-06-27 ENCOUNTER — Ambulatory Visit (HOSPITAL_COMMUNITY)
Admission: RE | Admit: 2019-06-27 | Payer: Medicare Other | Source: Ambulatory Visit | Attending: Cardiovascular Disease | Admitting: Cardiovascular Disease

## 2019-07-01 ENCOUNTER — Other Ambulatory Visit: Payer: Self-pay

## 2019-07-01 ENCOUNTER — Ambulatory Visit (HOSPITAL_COMMUNITY)
Admission: RE | Admit: 2019-07-01 | Discharge: 2019-07-01 | Disposition: A | Discharge status: Home / Self Care | Payer: Medicare Other | Source: Ambulatory Visit | Attending: Cardiovascular Disease | Admitting: Cardiovascular Disease

## 2019-07-01 ENCOUNTER — Other Ambulatory Visit (HOSPITAL_COMMUNITY): Payer: Self-pay | Admitting: Cardiovascular Disease

## 2019-07-01 DIAGNOSIS — I6522 Occlusion and stenosis of left carotid artery: Secondary | ICD-10-CM | POA: Insufficient documentation

## 2019-07-01 DIAGNOSIS — I6523 Occlusion and stenosis of bilateral carotid arteries: Secondary | ICD-10-CM

## 2019-07-07 ENCOUNTER — Other Ambulatory Visit: Payer: Self-pay

## 2019-07-07 DIAGNOSIS — I6529 Occlusion and stenosis of unspecified carotid artery: Secondary | ICD-10-CM

## 2019-07-07 DIAGNOSIS — I6522 Occlusion and stenosis of left carotid artery: Secondary | ICD-10-CM

## 2019-07-07 NOTE — Progress Notes (Signed)
[id number]

## 2019-07-09 ENCOUNTER — Other Ambulatory Visit: Payer: Self-pay

## 2019-07-09 ENCOUNTER — Other Ambulatory Visit (HOSPITAL_COMMUNITY): Payer: Self-pay | Admitting: Physician Assistant

## 2019-07-09 ENCOUNTER — Encounter (HOSPITAL_BASED_OUTPATIENT_CLINIC_OR_DEPARTMENT_OTHER): Payer: Medicare Other | Attending: Physician Assistant | Admitting: Physician Assistant

## 2019-07-09 ENCOUNTER — Other Ambulatory Visit: Payer: Self-pay | Admitting: Physician Assistant

## 2019-07-09 DIAGNOSIS — E1151 Type 2 diabetes mellitus with diabetic peripheral angiopathy without gangrene: Secondary | ICD-10-CM | POA: Insufficient documentation

## 2019-07-09 DIAGNOSIS — Z8249 Family history of ischemic heart disease and other diseases of the circulatory system: Secondary | ICD-10-CM | POA: Diagnosis not present

## 2019-07-09 DIAGNOSIS — E785 Hyperlipidemia, unspecified: Secondary | ICD-10-CM | POA: Insufficient documentation

## 2019-07-09 DIAGNOSIS — E1140 Type 2 diabetes mellitus with diabetic neuropathy, unspecified: Secondary | ICD-10-CM | POA: Insufficient documentation

## 2019-07-09 DIAGNOSIS — I132 Hypertensive heart and chronic kidney disease with heart failure and with stage 5 chronic kidney disease, or end stage renal disease: Secondary | ICD-10-CM | POA: Diagnosis not present

## 2019-07-09 DIAGNOSIS — E11621 Type 2 diabetes mellitus with foot ulcer: Secondary | ICD-10-CM

## 2019-07-09 DIAGNOSIS — L97512 Non-pressure chronic ulcer of other part of right foot with fat layer exposed: Secondary | ICD-10-CM | POA: Diagnosis not present

## 2019-07-09 DIAGNOSIS — L97509 Non-pressure chronic ulcer of other part of unspecified foot with unspecified severity: Secondary | ICD-10-CM

## 2019-07-09 DIAGNOSIS — E1122 Type 2 diabetes mellitus with diabetic chronic kidney disease: Secondary | ICD-10-CM | POA: Diagnosis not present

## 2019-07-09 DIAGNOSIS — Z992 Dependence on renal dialysis: Secondary | ICD-10-CM | POA: Insufficient documentation

## 2019-07-09 DIAGNOSIS — N186 End stage renal disease: Secondary | ICD-10-CM | POA: Diagnosis not present

## 2019-07-09 DIAGNOSIS — Z87891 Personal history of nicotine dependence: Secondary | ICD-10-CM | POA: Diagnosis not present

## 2019-07-09 DIAGNOSIS — I6529 Occlusion and stenosis of unspecified carotid artery: Secondary | ICD-10-CM | POA: Insufficient documentation

## 2019-07-09 DIAGNOSIS — I2510 Atherosclerotic heart disease of native coronary artery without angina pectoris: Secondary | ICD-10-CM | POA: Insufficient documentation

## 2019-07-09 DIAGNOSIS — Z833 Family history of diabetes mellitus: Secondary | ICD-10-CM | POA: Diagnosis not present

## 2019-07-09 DIAGNOSIS — I509 Heart failure, unspecified: Secondary | ICD-10-CM | POA: Diagnosis not present

## 2019-07-09 DIAGNOSIS — Z951 Presence of aortocoronary bypass graft: Secondary | ICD-10-CM | POA: Insufficient documentation

## 2019-07-11 NOTE — Progress Notes (Signed)
DIN, BOOKWALTER ([id number]) Visit Report for 07/09/2019 Chief Complaint Document Details Patient Name: Date of Service: Jon Anderson, Jon Anderson 07/09/2019 1:15 PM Medical Record [id number] Patient Account Number: [id number] Date of Birth/Sex: Treating RN: [birth year] (69 y.o. [name] Primary Care Provider: [name] Other Clinician: Referring Provider: Treating Provider/Extender:[name], [name], [name] in Treatment: 0 Information Obtained from: Patient Chief Complaint Right lateral foot ulcer Electronic Signature(s) Signed: 07/09/2019 2:55:29 PM By: Worthy Keeler PA-C Entered By: Worthy Keeler on 07/09/2019 14:55:29 -------------------------------------------------------------------------------- HPI Details Patient Name: Date of Service: Jon Anderson, Jon Anderson 07/09/2019 1:15 PM Medical Record [id number] Patient Account Number: [id number] Date of Birth/Sex: Treating RN: 1950-09-22 (69 y.o. [name] Primary Care Provider: [name] Other Clinician: Referring Provider: Treating Provider/Extender:[name], [name], [name] in Treatment: 0 History of Present Illness HPI Description: 07/09/2019 upon evaluation today patient appears to be doing somewhat poorly unfortunately in regard to the wound on his right lateral foot distally. He has been seen by Dr. March Rummage and has been under his care since January. He did have an arterial study on April 15, 2019 which did not appear to be too good at this point in my opinion. He was noncompressible in regard to the ABIs with abnormal TBI's that were actually measured at absent. With that being said the patient appears to have significant pain which is even to light touch I am not even sure how much we can be able to accomplish with him at this point secondary to this. I do believe Santyl could be of benefit. With that being said he does have a history of diabetes mellitus type 2 which is  diet controlled his last hemoglobin A1c was 6.5 that was in September of last year. The patient also has what appears to be peripheral vascular disease he has had previous intervention on the left lower extremity. He also is on renal dialysis at this point. The patient is obviously frustrated with the situation at this time he is not had an MRI at this point either. He did have an allograft placed following debridement that was on May 13, 2019 by Dr. March Rummage. Unfortunately this really did not do much for him. He has had a prior x-ray by Dr. March Rummage as well which did not reveal obvious signs of osteomyelitis but despite this I am concerned about the possibility of a bone infection just due to the chronic and longstanding nature of this wound despite aggressive interventions up to this point. He has been using mupirocin ointment on it currently. Electronic Signature(s) Signed: 07/09/2019 2:57:14 PM By: Worthy Keeler PA-C Entered By: Worthy Keeler on 07/09/2019 14:57:13 -------------------------------------------------------------------------------- Physical Exam Details Patient Name: Date of Service: Jon Anderson, Jon Anderson 07/09/2019 1:15 PM Medical Record [id number] Patient Account Number: [id number] Date of Birth/Sex: Treating RN: 10-26-1950 (69 y.o. [name] Primary Care Provider: [name] Other Clinician: Referring Provider: Treating Provider/Extender:[name], [name], [name] in Treatment: 0 Constitutional sitting or standing blood pressure is within target range for patient.. pulse regular and within target range for patient.Marland Kitchen respirations regular, non-labored and within target range for patient.Marland Kitchen temperature within target range for patient.. Well-nourished and well-hydrated in no acute distress. Eyes conjunctiva clear no eyelid edema noted. pupils equal round and reactive to light and accommodation. Ears, Nose, Mouth, and Throat no gross abnormality of  ear auricles or external auditory canals. normal hearing noted during conversation. mucus membranes moist. Respiratory normal breathing without difficulty. Cardiovascular Absent posterior  tibial and dorsalis pedis pulses bilateral lower extremities. trace pitting edema of the bilateral lower extremities. Musculoskeletal normal gait and posture. no significant deformity or arthritic changes, no loss or range of motion, no clubbing. Psychiatric this patient is able to make decisions and demonstrates good insight into disease process. Alert and Oriented x 3. pleasant and cooperative. Notes Patient's wound apparently showed signs of significant pain there was necrotic tissue noted over the surface of the wound I was not able to perform debridement today by any means even if he had good blood flow due to the fact that I was barely able to touch the wound secondary to his severe pain. Again this is consistent with arterial insufficiency ulcers as well which are obviously very painful. With that being said I do believe that the patient likely needs to have further evaluation with vascular possibly even an angiogram to see what may be blocked here. Apparently he has had interventions previously on the left lower extremity. Electronic Signature(s) Signed: 07/09/2019 2:58:23 PM By: Worthy Keeler PA-C Entered By: Worthy Keeler on 07/09/2019 14:58:22 -------------------------------------------------------------------------------- Physician Orders Details Patient Name: Date of Service: Jon Anderson, Jon Anderson 07/09/2019 1:15 PM Medical Record [id number] Patient Account Number: [id number] Date of Birth/Sex: Treating RN: Aug 31, 1950 (69 y.o. [name] Primary Care Provider: [name] Other Clinician: Referring Provider: Treating Provider/Extender:[name], [name], [name] in Treatment: 0 Verbal / Phone Orders: No Diagnosis Coding Follow-up Appointments Return  Appointment in 1 week. Dressing Change Frequency Wound #1 Right,Lateral Foot Change dressing every day. Wound Cleansing Wound #1 Right,Lateral Foot May shower and wash wound with soap and water. Primary Wound Dressing Wound #1 Right,Lateral Foot Santyl Ointment Secondary Dressing Wound #1 Right,Lateral Foot Foam Border Off-Loading Open toe surgical shoe to: - right foot Consults Vascular - VVS for evaluation of PAD with nonhealing diabetic foot ulcer right lateral foot Radiology MRI, lower extremity withwithout contrast right foot - right lateral foot diabetic foot ulcer CPT ICD-10 Patient Medications Allergies: kiwi, Flexeril, adhesive tape Notifications Medication Indication Start End Santyl 07/11/2019 DOSE topical 250 unit/gram ointment - ointment topical Apply nickel thick daily to the wound bed and then cover with a dressing as directed in clinic. Electronic Signature(s) Signed: 07/11/2019 4:45:56 PM By: Worthy Keeler PA-C Previous Signature: 07/09/2019 6:50:57 PM Version By: Baruch Gouty RN, BSN Previous Signature: 07/09/2019 10:08:08 PM Version By: Worthy Keeler PA-C Entered By: Worthy Keeler on 07/11/2019 16:45:55 -------------------------------------------------------------------------------- Prescription 07/09/2019 Patient Name: Jon Anderson. Provider: Worthy Keeler PA Date of Birth: 1951-03-09 NPI#: [id number] Sex: Valeta Harms: [id number] Phone #: [phone] License #: Patient Address: [address] [place], [place] [address] [address], [place] [address] [phone] Allergies kiwi Reaction: itching, swelling Severity: Moderate Flexeril Reaction: muscle weakness Severity: Moderate adhesive tape Reaction: plastic tape causes blisters Provider's Orders MRI, lower extremity withwithout contrast right foot - right lateral foot diabetic foot ulcer CPT  ICD-10 Signature(s): Date(s): Prescription 07/09/2019 Patient Name: Jon Anderson, Jon Anderson. Provider: Worthy Keeler PA Date of Birth: 27-Feb-1951 NPI#: [id number] Sex: Valeta Harms: [id number] Phone #: [phone] License #: Patient Address: [address] [place], [place] [address] [place], [place] [address] [phone] Allergies kiwi Reaction: itching, swelling Severity: Moderate Flexeril Reaction: muscle weakness Severity: Moderate adhesive tape Reaction: plastic tape causes blisters Provider's Orders Vascular - VVS for evaluation of PAD  with nonhealing diabetic foot ulcer right lateral foot Signature(s): Date(s): Electronic Signature(s) Signed: 07/11/2019 4:46:14 PM By: Worthy Keeler PA-C Previous Signature: 07/09/2019 6:50:57 PM Version By: Baruch Gouty RN, BSN Previous Signature: 07/09/2019 10:08:08 PM Version By: Worthy Keeler PA-C Entered By: Worthy Keeler on 07/11/2019 16:45:56 --------------------------------------------------------------------------------  Problem List Details Patient Name: Date of Service: KABEER, HOAGLAND 07/09/2019 1:15 PM Medical Record [id number] Patient Account Number: [id number] Date of Birth/Sex: Treating RN: 1950/04/15 (69 y.o. [name] Primary Care Provider: [name] Other Clinician: Referring Provider: Treating Provider/Extender:[name], [name], [name] in Treatment: 0 Active Problems ICD-10 Evaluated Encounter Code Description Active Date Today Diagnosis E11.621 Type 2 diabetes mellitus with foot ulcer 07/09/2019 No Yes L97.512 Non-pressure chronic ulcer of other part of right foot 07/09/2019 No Yes with fat layer exposed I73.89 Other specified peripheral vascular diseases 07/09/2019 No Yes Z99.2 Dependence on renal dialysis 07/09/2019 No Yes N18.6 End stage renal disease 07/09/2019 No Yes Inactive Problems Resolved  Problems Electronic Signature(s) Signed: 07/09/2019 2:55:17 PM By: Worthy Keeler PA-C Entered By: Worthy Keeler on 07/09/2019 14:55:17 -------------------------------------------------------------------------------- Progress Note Details Patient Name: Date of Service: Jon Anderson, Jon Anderson 07/09/2019 1:15 PM Medical Record [id number] Patient Account Number: [id number] Date of Birth/Sex: Treating RN: July 25, 1950 (69 y.o. [name] Primary Care Provider: [name] Other Clinician: Referring Provider: Treating Provider/Extender:[name], [name], [name] in Treatment: 0 Subjective Chief Complaint Information obtained from Patient Right lateral foot ulcer History of Present Illness (HPI) 07/09/2019 upon evaluation today patient appears to be doing somewhat poorly unfortunately in regard to the wound on his right lateral foot distally. He has been seen by Dr. March Rummage and has been under his care since January. He did have an arterial study on April 15, 2019 which did not appear to be too good at this point in my opinion. He was noncompressible in regard to the ABIs with abnormal TBI's that were actually measured at absent. With that being said the patient appears to have significant pain which is even to light touch I am not even sure how much we can be able to accomplish with him at this point secondary to this. I do believe Santyl could be of benefit. With that being said he does have a history of diabetes mellitus type 2 which is diet controlled his last hemoglobin A1c was 6.5 that was in September of last year. The patient also has what appears to be peripheral vascular disease he has had previous intervention on the left lower extremity. He also is on renal dialysis at this point. The patient is obviously frustrated with the situation at this time he is not had an MRI at this point either. He did have an allograft placed following debridement that was on May 13, 2019 by Dr. March Rummage. Unfortunately this really did not do much for him. He has had a prior x-ray by Dr. March Rummage as well which did not reveal obvious signs of osteomyelitis but despite this I am concerned about the possibility of a bone infection just due to the chronic and longstanding nature of this wound despite aggressive interventions up to this point. He has been using mupirocin ointment on it currently. Patient History Information obtained from Patient. Allergies kiwi (Severity: Moderate, Reaction: itching, swelling), Flexeril (Severity: Moderate, Reaction: muscle weakness), adhesive tape (Reaction: plastic tape causes blisters) Family History Cancer - Mother, Diabetes - Siblings,Father, Heart Disease - Siblings, Hypertension - Siblings, No family history of Hereditary Spherocytosis,  Kidney Disease, Lung Disease, Seizures, Stroke, Thyroid Problems, Tuberculosis. Social History Former smoker - quit over 20 years ago, Marital Status - Married, Alcohol Use - Never, Drug Use - No History, Caffeine Use - Daily - coffee. Medical History Cardiovascular Patient has history of Congestive Heart Failure, Coronary Artery Disease, Hypertension, Peripheral Arterial Disease Endocrine Patient has history of Type II Diabetes Genitourinary Patient has history of End Stage Renal Disease - Hemodialysis Neurologic Patient has history of Neuropathy Patient is treated with Controlled Diet. Blood sugar is tested. Medical And Surgical History Notes Cardiovascular CABG 2018, hyperlipidemia, carotid artery stenosis Review of Systems (ROS) Constitutional Symptoms (General Health) Denies complaints or symptoms of Fatigue, Fever, Chills, Marked Weight Change. Eyes Complains or has symptoms of Glasses / Contacts - glasses. Ear/Nose/Mouth/Throat Denies complaints or symptoms of Chronic sinus problems or rhinitis. Respiratory Denies complaints or symptoms of Chronic or frequent coughs, Shortness of  Breath. Gastrointestinal Denies complaints or symptoms of Frequent diarrhea, Nausea, Vomiting. Genitourinary Denies complaints or symptoms of Frequent urination. Integumentary (Skin) Complains or has symptoms of Wounds - wound on right foot. Musculoskeletal Denies complaints or symptoms of Muscle Pain, Muscle Weakness. Objective Constitutional sitting or standing blood pressure is within target range for patient.. pulse regular and within target range for patient.Marland Kitchen respirations regular, non-labored and within target range for patient.Marland Kitchen temperature within target range for patient.. Well-nourished and well-hydrated in no acute distress. Vitals Time Taken: 1:35 PM, Height: 71 in, Source: Stated, Weight: 150 lbs, Source: Stated, BMI: 20.9, Temperature: 98.1 F, Pulse: 75 bpm, Respiratory Rate: 16 breaths/min, Blood Pressure: 99/49 mmHg, Capillary Blood Glucose: 99 mg/dl. General Notes: glucose per pt report Eyes conjunctiva clear no eyelid edema noted. pupils equal round and reactive to light and accommodation. Ears, Nose, Mouth, and Throat no gross abnormality of ear auricles or external auditory canals. normal hearing noted during conversation. mucus membranes moist. Respiratory normal breathing without difficulty. Cardiovascular Absent posterior tibial and dorsalis pedis pulses bilateral lower extremities. trace pitting edema of the bilateral lower extremities. Musculoskeletal normal gait and posture. no significant deformity or arthritic changes, no loss or range of motion, no clubbing. Psychiatric this patient is able to make decisions and demonstrates good insight into disease process. Alert and Oriented x 3. pleasant and cooperative. General Notes: Patient's wound apparently showed signs of significant pain there was necrotic tissue noted over the surface of the wound I was not able to perform debridement today by any means even if he had good blood flow due to the fact that I  was barely able to touch the wound secondary to his severe pain. Again this is consistent with arterial insufficiency ulcers as well which are obviously very painful. With that being said I do believe that the patient likely needs to have further evaluation with vascular possibly even an angiogram to see what may be blocked here. Apparently he has had interventions previously on the left lower extremity. Integumentary (Hair, Skin) Wound #1 status is Open. Original cause of wound was Gradually Appeared. The wound is located on the Right,Lateral Foot. The wound measures 0.8cm length x 1.7cm width x 0.3cm depth; 1.068cm^2 area and 0.32cm^3 volume. There is no tunneling or undermining noted. There is a medium amount of serosanguineous drainage noted. The wound margin is flat and intact. There is no granulation within the wound bed. There is a large (67-100%) amount of necrotic tissue within the wound bed including Adherent Slough. Assessment Active Problems ICD-10 Type 2 diabetes mellitus with foot ulcer Non-pressure chronic  ulcer of other part of right foot with fat layer exposed Other specified peripheral vascular diseases Dependence on renal dialysis End stage renal disease Plan Follow-up Appointments: Return Appointment in 1 week. Dressing Change Frequency: Wound #1 Right,Lateral Foot: Change dressing every day. Wound Cleansing: Wound #1 Right,Lateral Foot: May shower and wash wound with soap and water. Primary Wound Dressing: Wound #1 Right,Lateral Foot: Santyl Ointment Secondary Dressing: Wound #1 Right,Lateral Foot: Foam Border Off-Loading: Open toe surgical shoe to: - right foot Radiology ordered were: MRI, lower extremity withwithout contrast right foot - right lateral foot diabetic foot ulcer CPT ICD-10 Consults ordered were: Vascular - VVS for evaluation of PAD with nonhealing diabetic foot ulcer right lateral foot The following medication(s) was prescribed: Santyl  topical 250 unit/gram ointment ointment topical Apply nickel thick daily to the wound bed and then cover with a dressing as directed in clinic. starting 07/11/2019 1. My suggestion at this time is good be that I think the Santyl would be beneficial for him I think that this is something that I would recommend he go ahead and get that will help clean up the wound better than probably anything else we have an option for. 2. I am also can recommend at this point that we go ahead and make a referral for him to vein and vascular in order to further evaluate his arterial flow. I think he may require an arteriogram to further evaluate this. That was discussed with the patient. 3. I am also can recommend at this time that we go ahead and have him continue with the PEG assist surgical shoe. 4. I am also can recommend an MRI of the right foot in order to evaluate for possible osteomyelitis secondary to the fact that again he appears to be having significant issues despite the fact that he has had fairly aggressive interventions up to this point with Dr. March Rummage. All of the lack of progress could be secondary to arterial insufficiency but at the same time I am not ruling out osteomyelitis. He is previously had an x-ray with Dr. March Rummage. We will see patient back for reevaluation in 1 week here in the clinic. If anything worsens or changes patient will contact our office for additional recommendations. Electronic Signature(s) Signed: 07/11/2019 4:46:04 PM By: Worthy Keeler PA-C Previous Signature: 07/09/2019 2:59:38 PM Version By: Worthy Keeler PA-C Entered By: Worthy Keeler on 07/11/2019 16:46:03 -------------------------------------------------------------------------------- HxROS Details Patient Name: Date of Service: Jon Anderson, Jon Anderson 07/09/2019 1:15 PM Medical Record [id number] Patient Account Number: [id number] Date of Birth/Sex: Treating RN: December 18, 1950 (69 y.o. [name] Primary  Care Provider: [name] Other Clinician: Referring Provider: Treating Provider/Extender:[name], [name], [name] in Treatment: 0 Information Obtained From Patient Constitutional Symptoms (General Health) Complaints and Symptoms: Negative for: Fatigue; Fever; Chills; Marked Weight Change Eyes Complaints and Symptoms: Positive for: Glasses / Contacts - glasses Ear/Nose/Mouth/Throat Complaints and Symptoms: Negative for: Chronic sinus problems or rhinitis Respiratory Complaints and Symptoms: Negative for: Chronic or frequent coughs; Shortness of Breath Gastrointestinal Complaints and Symptoms: Negative for: Frequent diarrhea; Nausea; Vomiting Genitourinary Complaints and Symptoms: Negative for: Frequent urination Medical History: Positive for: End Stage Renal Disease - Hemodialysis Integumentary (Skin) Complaints and Symptoms: Positive for: Wounds - wound on right foot Musculoskeletal Complaints and Symptoms: Negative for: Muscle Pain; Muscle Weakness Hematologic/Lymphatic Cardiovascular Medical History: Positive for: Congestive Heart Failure; Coronary Artery Disease; Hypertension; Peripheral Arterial Disease Past Medical History Notes: CABG 2018, hyperlipidemia, carotid artery stenosis Endocrine Medical History:  Positive for: Type II Diabetes Time with diabetes: over 20 years Treated with: Diet Blood sugar tested every day: Yes Tested : twice a day Immunological Neurologic Medical History: Positive for: Neuropathy Oncologic Psychiatric Immunizations Pneumococcal Vaccine: Received Pneumococcal Vaccination: Yes Implantable Devices None Family and Social History Cancer: Yes - Mother; Diabetes: Yes - Siblings,Father; Heart Disease: Yes - Siblings; Hereditary Spherocytosis: No; Hypertension: Yes - Siblings; Kidney Disease: No; Lung Disease: No; Seizures: No; Stroke: No; Thyroid Problems: No; Tuberculosis: No; Former smoker - quit over 20 years ago;  Marital Status - Married; Alcohol Use: Never; Drug Use: No History; Caffeine Use: Daily - coffee; Financial Concerns: No; Food, Clothing or Shelter Needs: No; Support System Lacking: No; Transportation Concerns: No Electronic Signature(s) Signed: 07/09/2019 10:08:08 PM By: Worthy Keeler PA-C Signed: 07/11/2019 5:28:43 PM By: Levan Hurst RN, BSN Entered By: Levan Hurst on 07/09/2019 14:12:37 -------------------------------------------------------------------------------- SuperBill Details Patient Name: Date of Service: Jon Anderson, Jon Anderson 07/09/2019 Medical Record [id number] Patient Account Number: [id number] Date of Birth/Sex: Treating RN: 03-05-1951 (69 y.o. [name] Primary Care Provider: [name] Other Clinician: Referring Provider: Treating Provider/Extender:[name], [name], [name] in Treatment: 0 Diagnosis Coding ICD-10 Codes Code Description E11.621 Type 2 diabetes mellitus with foot ulcer L97.512 Non-pressure chronic ulcer of other part of right foot with fat layer exposed I73.89 Other specified peripheral vascular diseases Z99.2 Dependence on renal dialysis N18.6 End stage renal disease Facility Procedures CPT4 Code: [id number] Description: 99214 - WOUND CARE VISIT-LEV 4 EST PT Modifier: Quantity: 1 Physician Procedures CPT4 Code Description: [id number] - WC PHYS LEVEL 4 - NEW PT ICD-10 Diagnosis Description E11.621 Type 2 diabetes mellitus with foot ulcer L97.512 Non-pressure chronic ulcer of other part of right foot I73.89 Other specified peripheral vascular  diseases Z99.2 Dependence on renal dialysis Modifier: with fat layer e Quantity: 1 xposed Electronic Signature(s) Signed: 07/09/2019 2:59:53 PM By: Worthy Keeler PA-C Entered By: Worthy Keeler on 07/09/2019 14:59:52

## 2019-07-11 NOTE — Progress Notes (Signed)
Jon Anderson, Jon Anderson ([id number]) Visit Report for 07/09/2019 Allergy List Details Patient Name: Date of Service: Jon Anderson, Jon Anderson 07/09/2019 1:15 PM Medical Record [id number] Patient Account Number: [id number] Date of Birth/Sex: Treating RN: 13-Jan-1951 (69 y.o. Jon Anderson Primary Care Jon Anderson: Jon Anderson Other Clinician: Referring Jon Anderson: Treating Jon Anderson/Extender:[name], Jon Anderson, Jon Anderson in Treatment: 0 Allergies Active Allergies kiwi Reaction: itching, swelling Severity: Moderate Flexeril Reaction: muscle weakness Severity: Moderate adhesive tape Reaction: plastic tape causes blisters Allergy Notes Electronic Signature(s) Signed: 07/11/2019 5:28:43 PM By: Jon Hurst RN, BSN Entered By: Jon Anderson on 07/09/2019 13:39:07 -------------------------------------------------------------------------------- Arrival Information Details Patient Name: Date of Service: Jon Anderson, Jon Anderson 07/09/2019 1:15 PM Medical Record [id number] Patient Account Number: [id number] Date of Birth/Sex: Treating RN: 1950-11-18 (69 y.o. Jon Anderson Primary Care Jon Anderson: Jon Anderson Other Clinician: Referring Jon Anderson: Treating Jon Anderson/Extender:[name], Jon Anderson, Jon Anderson in Treatment: 0 Visit Information Patient Arrived: Walker Arrival Time: 13:27 Accompanied By: alone Transfer Assistance: None Patient Identification Verified: Yes Secondary Verification Process Yes Completed: Patient Requires Transmission- No Based Precautions: Patient Has Alerts: Yes Patient Alerts: R ABI non compressible Electronic Signature(s) Signed: 07/11/2019 5:28:43 PM By: Jon Hurst RN, BSN Entered By: Jon Anderson on 07/09/2019 13:37:09 -------------------------------------------------------------------------------- Clinic Level of Care Assessment Details Patient Name: Date of Service: Jon Anderson, Jon Anderson 07/09/2019 1:15 PM Medical Record  [id number] Patient Account Number: [id number] Date of Birth/Sex: Treating RN: July 16, 1950 (69 y.o. Jon Anderson Primary Care Jon Anderson: Jon Anderson Other Clinician: Referring Jon Anderson: Treating Jon Anderson/Extender:[name], Jon Anderson, Jon Anderson in Treatment: 0 Clinic Level of Care Assessment Items TOOL 2 Quantity Score []  - Use when only an EandM is performed on the INITIAL visit 0 ASSESSMENTS - Nursing Assessment / Reassessment X - General Physical Exam (combine w/ comprehensive assessment (listed just below) 1 20 when performed on new pt. evals) X - Comprehensive Assessment (HX, ROS, Risk Assessments, Wounds Hx, etc.) 1 25 ASSESSMENTS - Wound and Skin Assessment / Reassessment X - Simple Wound Assessment / Reassessment - one wound 1 5 []  - Complex Wound Assessment / Reassessment - multiple wounds 0 []  - Dermatologic / Skin Assessment (not related to wound area) 0 ASSESSMENTS - Ostomy and/or Continence Assessment and Care []  - Incontinence Assessment and Management 0 []  - Ostomy Care Assessment and Management (repouching, etc.) 0 PROCESS - Coordination of Care X - Simple Patient / Family Education for ongoing care 1 15 []  - Complex (extensive) Patient / Family Education for ongoing care 0 X - Staff obtains Programmer, systems, Records, Test Results / Process Orders 1 10 []  - Staff telephones HHA, Nursing Homes / Clarify orders / etc 0 []  - Routine Transfer to another Facility (non-emergent condition) 0 []  - Routine Hospital Admission (non-emergent condition) 0 X - New Admissions / Biomedical engineer / Ordering NPWT, Apligraf, etc. 1 15 []  - Emergency Hospital Admission (emergent condition) 0 X - Simple Discharge Coordination 1 10 []  - Complex (extensive) Discharge Coordination 0 PROCESS - Special Needs []  - Pediatric / Minor Patient Management 0 []  - Isolation Patient Management 0 []  - Hearing / Language / Visual special needs 0 []  - Assessment of Community assistance  (transportation, D/C planning, etc.) 0 []  - Additional assistance / Altered mentation 0 []  - Support Surface(s) Assessment (bed, cushion, seat, etc.) 0 INTERVENTIONS - Wound Cleansing / Measurement X - Wound Imaging (photographs - any number of wounds) 1 5 []  - Wound Tracing (instead of photographs) 0 X - Simple Wound Measurement - one wound 1 5 []  -  Complex Wound Measurement - multiple wounds 0 X - Simple Wound Cleansing - one wound 1 5 []  - Complex Wound Cleansing - multiple wounds 0 INTERVENTIONS - Wound Dressings X - Small Wound Dressing one or multiple wounds 1 10 []  - Medium Wound Dressing one or multiple wounds 0 []  - Large Wound Dressing one or multiple wounds 0 []  - Application of Medications - injection 0 INTERVENTIONS - Miscellaneous []  - External ear exam 0 []  - Specimen Collection (cultures, biopsies, blood, body fluids, etc.) 0 []  - Specimen(s) / Culture(s) sent or taken to Lab for analysis 0 []  - Patient Transfer (multiple staff / Jon Anderson Lift / Similar devices) 0 []  - Simple Staple / Suture removal (25 or less) 0 []  - Complex Staple / Suture removal (26 or more) 0 []  - Hypo / Hyperglycemic Management (close monitor of Blood Glucose) 0 []  - Ankle / Brachial Index (ABI) - do not check if billed separately 0 Has the patient been seen at the hospital within the last three years: Yes Total Score: 125 Level Of Care: New/Established - Level 4 Electronic Signature(s) Signed: 07/09/2019 6:50:57 PM By: Jon Gouty RN, BSN Entered By: Jon Anderson on 07/09/2019 14:54:19 -------------------------------------------------------------------------------- Encounter Discharge Information Details Patient Name: Date of Service: Jon Anderson, Jon Anderson 07/09/2019 1:15 PM Medical Record [id number] Patient Account Number: [id number] Date of Birth/Sex: Treating RN: 12-13-1950 (68 y.o. Jon Anderson Primary Care Jadien Lehigh: Jon Anderson Other Clinician: Referring Balraj Brayfield:  Treating Ceaira Ernster/Extender:[name], Jon Anderson, Jon Anderson in Treatment: 0 Encounter Discharge Information Items Discharge Condition: Stable Ambulatory Status: Walker Discharge Destination: Home Transportation: Private Auto Accompanied By: self Schedule Follow-up Appointment: Yes Clinical Summary of Care: Patient Declined Electronic Signature(s) Signed: 07/09/2019 5:40:01 PM By: Carlene Coria RN Entered By: Carlene Coria on 07/09/2019 15:03:03 -------------------------------------------------------------------------------- Lower Extremity Assessment Details Patient Name: Date of Service: Jon Anderson, Jon Anderson 07/09/2019 1:15 PM Medical Record [id number] Patient Account Number: [id number] Date of Birth/Sex: Treating RN: 08/30/1950 (69 y.o. Jon Anderson Primary Care Tanganyika Bowlds: Other Clinician: Jilda Anderson Referring Karaline Buresh: Treating Jordynne Mccown/Extender:[name], Jon Anderson, Jon Anderson in Treatment: 0 Edema Assessment Assessed: [Left: No] [Right: No] Edema: [Left: Ye] [Right: s] Calf Left: Right: Point of Measurement: 32 cm From Medial Instep cm 30 cm Ankle Left: Right: Point of Measurement: 10 cm From Medial Instep cm 22 cm Vascular Assessment Pulses: Dorsalis Pedis Palpable: [Right:Yes] Electronic Signature(s) Signed: 07/11/2019 5:28:43 PM By: Jon Hurst RN, BSN Entered By: Jon Anderson on 07/09/2019 14:02:06 -------------------------------------------------------------------------------- [place] Details Patient Name: Date of Service: Jon Anderson, Jon Anderson 07/09/2019 1:15 PM Medical Record [id number] Patient Account Number: [id number] Date of Birth/Sex: Treating RN: 1950/04/19 (69 y.o. Jon Anderson Primary Care Zanae Kuehnle: Jon Anderson Other Clinician: Referring Shy Guallpa: Treating Tanaja Ganger/Extender:[name], Jon Anderson, Jon Anderson in Treatment: 0 Active Inactive Abuse / Safety / Falls / Self Care Management Nursing  Diagnoses: Potential for falls Goals: Patient/caregiver will verbalize/demonstrate measures taken to prevent injury and/or falls Date Initiated: 07/09/2019 Target Resolution Date: 08/06/2019 Goal Status: Active Interventions: Assess fall risk on admission and as needed Assess impairment of mobility on admission and as needed per policy Notes: Nutrition Nursing Diagnoses: Impaired glucose control: actual or potential Potential for alteratiion in Nutrition/Potential for imbalanced nutrition Goals: Patient/caregiver will maintain therapeutic glucose control Date Initiated: 07/09/2019 Target Resolution Date: 08/06/2019 Goal Status: Active Interventions: Provide education on elevated blood sugars and impact on wound healing Treatment Activities: Patient referred to Primary Care Physician for further nutritional evaluation : 07/09/2019 Notes: Tissue Oxygenation Nursing  Diagnoses: Knowledge deficit related to disease process and management Potential alteration in peripheral tissue perfusion (select prior to confirmation of diagnosis) Goals: Patient/caregiver will verbalize understanding of disease process and disease management Date Initiated: 07/09/2019 Target Resolution Date: 08/06/2019 Goal Status: Active Interventions: Assess patient understanding of disease process and management upon diagnosis and as needed Assess peripheral arterial status upon admission and as needed Treatment Activities: Test ordered outside of clinic : 07/09/2019 Notes: Wound/Skin Impairment Nursing Diagnoses: Impaired tissue integrity Knowledge deficit related to ulceration/compromised skin integrity Goals: Patient/caregiver will verbalize understanding of skin care regimen Date Initiated: 07/09/2019 Target Resolution Date: 08/06/2019 Goal Status: Active Ulcer/skin breakdown will have a volume reduction of 30% by week 4 Date Initiated: 07/09/2019 Target Resolution Date: 08/06/2019 Goal Status:  Active Interventions: Assess patient/caregiver ability to obtain necessary supplies Assess patient/caregiver ability to perform ulcer/skin care regimen upon admission and as needed Assess ulceration(s) every visit Treatment Activities: Skin care regimen initiated : 07/09/2019 Topical wound management initiated : 07/09/2019 Notes: Electronic Signature(s) Signed: 07/09/2019 6:50:57 PM By: Jon Gouty RN, BSN Entered By: Jon Anderson on 07/09/2019 14:44:50 -------------------------------------------------------------------------------- Pain Assessment Details Patient Name: Date of Service: Jon Anderson, Jon Anderson 07/09/2019 1:15 PM Medical Record [id number] Patient Account Number: [id number] Date of Birth/Sex: Treating RN: 1950/09/14 (69 y.o. Jon Anderson Primary Care Seriah Brotzman: Jon Anderson Other Clinician: Referring Tangela Dolliver: Treating Menna Abeln/Extender:[name], Jon Anderson, Jon Anderson in Treatment: 0 Active Problems Location of Pain Severity and Description of Pain Patient Has Paino Yes Site Locations Pain Location: Pain in Ulcers With Dressing Change: Yes Duration of the Pain. Constant / Intermittento Intermittent Rate the pain. Current Pain Level: 6 Worst Pain Level: 10 Least Pain Level: 0 Character of Pain Describe the Pain: Throbbing Pain Management and Medication Current Pain Management: Medication: Yes Cold Application: No Rest: No Massage: No Activity: No T.E.N.S.: No Heat Application: No Leg drop or elevation: No Is the Current Pain Management Adequate: Adequate How does your wound impact your activities of daily livingo Sleep: No Bathing: No Appetite: No Relationship With Others: No Bladder Continence: No Emotions: No Bowel Continence: No Work: No Toileting: No Drive: No Dressing: No Hobbies: No Engineer, maintenance) Signed: 07/11/2019 5:28:43 PM By: Jon Hurst RN, BSN Entered By: Jon Anderson on 07/09/2019  13:58:40 -------------------------------------------------------------------------------- Patient/Caregiver Education Details Meuth, Naomi 3/31/2021andnbsp1:15 Patient Name: Date of Service: L. PM Medical Record Patient Account Number: [id number] Number: Treating RN: Jon Anderson Date of Birth/Gender: 1951-02-27 (68 y.o. M) Other Clinician: Primary Care Physician: Jon Anderson Treating Worthy Keeler Referring Physician: Physician/Extender: Dede Query in Treatment: 0 Education Assessment Education Provided To: Patient Education Topics Provided Elevated Blood Sugar/ Impact on Healing: Handouts: Elevated Blood Sugars: How Do They Affect Wound Healing Methods: Explain/Verbal, Printed Responses: Reinforcements needed, State content correctly Offloading: Handouts: What is Offloadingo Methods: Explain/Verbal Responses: Reinforcements needed, State content correctly Tissue Oxygenation: Handouts: Peripheral Arterial Disease and Related Ulcers Methods: Explain/Verbal, Printed Responses: Reinforcements needed, State content correctly Welcome To [facility] [place]: Handouts: Welcome To [facility] [place] Methods: Explain/Verbal, Printed Responses: Reinforcements needed, State content correctly Electronic Signature(s) Signed: 07/09/2019 6:50:57 PM By: Jon Gouty RN, BSN Entered By: Jon Anderson on 07/09/2019 14:45:56 -------------------------------------------------------------------------------- Wound Assessment Details Patient Name: Date of Service: Jon Anderson, Jon Anderson 07/09/2019 1:15 PM Medical Record [id number] Patient Account Number: [id number] Date of Birth/Sex: Treating RN: 07-12-1950 (69 y.o. Jon Anderson Primary Care Oluwaseyi Tull: Jon Anderson Other Clinician: Referring Loic Hobin: Treating Laurelyn Terrero/Extender:[name], Jon Anderson, Jon Anderson in Treatment: 0 Wound Status  Wound Number: 1 Primary Diabetic Wound/Ulcer of the  Lower Extremity Etiology: Wound Location: Right Foot - Lateral Wound Open Wounding Event: Gradually Appeared Status: Date Acquired: 04/25/2019 Comorbid Congestive Heart Failure, Coronary Artery Weeks Of Treatment: 0 History: Disease, Hypertension, Peripheral Arterial Clustered Wound: No Disease, Type II Diabetes, End Stage Renal Disease, Neuropathy Wound Measurements Length: (cm) 0.8 % Reduction Width: (cm) 1.7 % Reduction Depth: (cm) 0.3 Epitheliali Area: (cm) 1.068 Tunneling: Volume: (cm) 0.32 Underminin Wound Description Classification: Unable to visualize wound bed Wound Margin: Flat and Intact Exudate Amount: Medium Exudate Type: Serosanguineous Exudate Color: red, brown Wound Bed Granulation Amount: None Present (0%) Necrotic Amount: Large (67-100%) Necrotic Quality: Adherent Slough Foul Odor After Cleansing: No Slough/Fibrino Yes Exposed Structure Fascia Exposed: No Fat Layer (Subcutaneous Tissue) Exposed: No Tendon Exposed: No Muscle Exposed: No Joint Exposed: No Bone Exposed: No in Area: in Volume: zation: None No g: No Treatment Notes Wound #1 (Right, Lateral Foot) 1. Cleanse With Wound Cleanser 3. Primary Dressing Applied Santyl 4. Secondary Dressing Foam Border Dressing Electronic Signature(s) Signed: 07/11/2019 5:28:43 PM By: Jon Hurst RN, BSN Entered By: Jon Anderson on 07/09/2019 13:58:08 -------------------------------------------------------------------------------- [place] Details Patient Name: Date of Service: Jon Anderson, Jon Anderson 07/09/2019 1:15 PM Medical Record [id number] Patient Account Number: [id number] Date of Birth/Sex: Treating RN: 05/29/1950 (69 y.o. Jon Anderson Primary Care Lebert Lovern: Jon Anderson Other Clinician: Referring Briggitte Boline: Treating Caylin Raby/Extender:[name], Jon Anderson, Jon Anderson in Treatment: 0 Vital Signs Time Taken: 13:35 Temperature (F): 98.1 Height (in): 71 Pulse (bpm):  75 Source: Stated Respiratory Rate (breaths/min): 16 Weight (lbs): 150 Blood Pressure (mmHg): 99/49 Source: Stated Capillary Blood Glucose (mg/dl): 99 Body Mass Index (BMI): 20.9 Reference Range: 80 - 120 mg / dl Notes glucose per pt report Electronic Signature(s) Signed: 07/11/2019 5:28:43 PM By: Jon Hurst RN, BSN Entered By: Jon Anderson on 07/09/2019 13:38:04

## 2019-07-11 NOTE — Progress Notes (Signed)
NASHTON, BELSON ([id number]) Visit Report for 07/09/2019 Abuse/Suicide Risk Screen Details Patient Name: Date of Service: DEAGLAN, LILE 07/09/2019 1:15 PM Medical Record [id number] Patient Account Number: [id number] Date of Birth/Sex: Treating RN: February 23, 1951 (69 y.o. [name] Primary Care Zamari Bonsall: [name] Other Clinician: Referring Citlalic Norlander: Treating Kelilah Hebard/Extender:[name], [name], [name] in Treatment: 0 Abuse/Suicide Risk Screen Items Answer ABUSE RISK SCREEN: Has anyone close to you tried to hurt or harm you recentlyo No Do you feel uncomfortable with anyone in your familyo No Has anyone forced you do things that you didnt want to doo No Electronic Signature(s) Signed: 07/11/2019 5:28:43 PM By: Levan Hurst RN, BSN Entered By: Levan Hurst on 07/09/2019 13:58:53 -------------------------------------------------------------------------------- Activities of Daily Living Details Patient Name: Date of Service: VADEN, BECHERER 07/09/2019 1:15 PM Medical Record [id number] Patient Account Number: [id number] Date of Birth/Sex: Treating RN: 02/02/1951 (69 y.o. [name] Primary Care Lakera Viall: [name] Other Clinician: Referring Jed Kutch: Treating Kreig Parson/Extender:[name], [name], [name] in Treatment: 0 Activities of Daily Living Items Answer Activities of Daily Living (Please select one for each item) Drive Automobile Need Assistance Take Medications Completely Able Use Telephone Completely Able Care for Appearance Completely Able Use Toilet Completely Able Bath / Shower Completely Able Dress Self Completely Able Feed Self Completely Able Walk Completely Able Get In / Out Bed Completely Able Housework Completely [place] for Self Need Assistance Electronic Signature(s) Signed: 07/11/2019 5:28:43 PM By: Levan Hurst RN, BSN Entered  By: Levan Hurst on 07/09/2019 13:59:31 -------------------------------------------------------------------------------- Education Screening Details Patient Name: Date of Service: ALECXIS, BALTZELL 07/09/2019 1:15 PM Medical Record [id number] Patient Account Number: [id number] Date of Birth/Sex: Treating RN: 08-10-1950 (68 y.o. [name] Primary Care Ixchel Duck: [name] Other Clinician: Referring Liliani Bobo: Treating Denia Mcvicar/Extender:[name], [name], [name] in Treatment: 0 Learning Preferences/Education Level/Primary Language Learning Preference: Explanation, Demonstration, Printed Material Highest Education Level: High School Preferred Language: English Cognitive Barrier Language Barrier: No Translator Needed: No Memory Deficit: No Emotional Barrier: No Cultural/Religious Beliefs Affecting Medical Care: No Physical Barrier Impaired Vision: No Impaired Hearing: No Decreased Hand dexterity: No Knowledge/Comprehension Knowledge Level: High Comprehension Level: High Ability to understand written High instructions: Ability to understand verbal High instructions: Motivation Anxiety Level: Calm Cooperation: Cooperative Education Importance: Acknowledges Need Interest in Health Problems: Asks Questions Perception: Coherent Willingness to Engage in Self- High Management Activities: Readiness to Engage in Self- High Management Activities: Electronic Signature(s) Signed: 07/11/2019 5:28:43 PM By: Levan Hurst RN, BSN Entered By: Levan Hurst on 07/09/2019 13:59:49 -------------------------------------------------------------------------------- Fall Risk Assessment Details Patient Name: Date of Service: ZHANE, BLUITT 07/09/2019 1:15 PM Medical Record [id number] Patient Account Number: [id number] Date of Birth/Sex: Treating RN: [birth year] (69 y.o. [name] Primary Care Rydge Texidor: [name] Other  Clinician: Referring An Schnabel: Treating Yarelis Ambrosino/Extender:[name], [name], [name] in Treatment: 0 Fall Risk Assessment Items Have you had 2 or more falls in the last 12 monthso 0 No Have you had any fall that resulted in injury in the last 12 monthso 0 No FALLS RISK SCREEN History of falling - immediate or within 3 months 0 No Secondary diagnosis (Do you have 2 or more medical diagnoseso) 15 Yes Ambulatory aid None/bed rest/wheelchair/nurse 0 No Crutches/cane/walker 15 Yes Furniture 0 No Intravenous therapy Access/Saline/Heparin Lock 0 No Weak (short steps with or without shuffle, stooped but able to lift head 0 No while walking, may seek support from furniture) Impaired (short  steps with shuffle, may have difficulty arising from chair, 0 No head down, impaired balance) Mental Status Oriented to own ability 0 Yes Overestimates or forgets limitations 0 No Risk Level: Medium Risk Score: 30 Electronic Signature(s) Signed: 07/11/2019 5:28:43 PM By: Levan Hurst RN, BSN Entered By: Levan Hurst on 07/09/2019 14:00:27 -------------------------------------------------------------------------------- Foot Assessment Details Patient Name: Date of Service: REILEY, KEISLER 07/09/2019 1:15 PM Medical Record [id number] Patient Account Number: [id number] Date of Birth/Sex: Treating RN: 1950-07-13 (69 y.o. [name] Primary Care Jawaan Adachi: [name] Other Clinician: Referring Kasyn Rolph: Treating Lauralie Blacksher/Extender:[name], [name], [name] in Treatment: 0 Foot Assessment Items Site Locations + = Sensation present, - = Sensation absent, C = Callus, U = Ulcer R = Redness, W = Warmth, M = Maceration, PU = Pre-ulcerative lesion F = Fissure, S = Swelling, D = Dryness Assessment Right: Left: Other Deformity: No No Prior Foot Ulcer: No No Prior Amputation: No No Charcot Joint: No No Ambulatory Status: Ambulatory With Help Assistance Device:  Walker Gait: Steady Electronic Signature(s) Signed: 07/11/2019 5:28:43 PM By: Levan Hurst RN, BSN Entered By: Levan Hurst on 07/09/2019 14:01:49 -------------------------------------------------------------------------------- Nutrition Risk Screening Details Patient Name: Date of Service: ALOYSIOUS, VANGIESON 07/09/2019 1:15 PM Medical Record [id number] Patient Account Number: [id number] Date of Birth/Sex: Treating RN: [birth year] (69 y.o. [name] Primary Care Chana Lindstrom: [name] Other Clinician: Referring Elise Knobloch: Treating Dorothia Passmore/Extender:[name], [name], [name] in Treatment: 0 Height (in): 71 Weight (lbs): 150 Body Mass Index (BMI): 20.9 Nutrition Risk Screening Items Score Screening NUTRITION RISK SCREEN: I have an illness or condition that made me change the kind and/or 2 Yes amount of food I eat I eat fewer than two meals per day 0 No I eat few fruits and vegetables, or milk products 0 No I have three or more drinks of beer, liquor or wine almost every day 0 No I have tooth or mouth problems that make it hard for me to eat 0 No I don't always have enough money to buy the food I need 0 No I eat alone most of the time 0 No I take three or more different prescribed or over-the-counter drugs a day 1 Yes 0 No Without wanting to, I have lost or gained 10 pounds in the last six months I am not always physically able to shop, cook and/or feed myself 2 Yes Nutrition Protocols Good Risk Protocol Provide education on Moderate Risk Protocol 0 nutrition High Risk Proctocol Risk Level: Moderate Risk Score: 5 Electronic Signature(s) Signed: 07/11/2019 5:28:43 PM By: Levan Hurst RN, BSN Entered By: Levan Hurst on 07/09/2019 14:00:16

## 2019-07-16 ENCOUNTER — Other Ambulatory Visit: Payer: Self-pay | Admitting: Podiatry

## 2019-07-16 MED ORDER — TRAMADOL HCL 50 MG PO TABS: 50.0000 mg | ORAL_TABLET | Freq: Three times a day (TID) | ORAL | 0 refills | Status: AC | PRN

## 2019-07-16 NOTE — Addendum Note (Signed)
Addended by: Hardie Pulley on: 07/16/2019 01:45 PM   Modules accepted: Orders

## 2019-07-16 NOTE — Telephone Encounter (Signed)
Called tramadol to [phone].

## 2019-07-16 NOTE — Telephone Encounter (Signed)
Patient would like refill of Tramadol called to [facility] [address] [facility]

## 2019-07-16 NOTE — Telephone Encounter (Signed)
One time fill. No further pain medication will be prescribed as he now follows with the [facility].

## 2019-07-17 ENCOUNTER — Other Ambulatory Visit: Payer: Self-pay

## 2019-07-17 ENCOUNTER — Encounter (HOSPITAL_BASED_OUTPATIENT_CLINIC_OR_DEPARTMENT_OTHER): Payer: Medicare Other | Attending: Internal Medicine | Admitting: Internal Medicine

## 2019-07-17 DIAGNOSIS — E1122 Type 2 diabetes mellitus with diabetic chronic kidney disease: Secondary | ICD-10-CM | POA: Insufficient documentation

## 2019-07-17 DIAGNOSIS — E1151 Type 2 diabetes mellitus with diabetic peripheral angiopathy without gangrene: Secondary | ICD-10-CM | POA: Diagnosis not present

## 2019-07-17 DIAGNOSIS — L97512 Non-pressure chronic ulcer of other part of right foot with fat layer exposed: Secondary | ICD-10-CM | POA: Insufficient documentation

## 2019-07-17 DIAGNOSIS — E11621 Type 2 diabetes mellitus with foot ulcer: Secondary | ICD-10-CM | POA: Diagnosis present

## 2019-07-17 DIAGNOSIS — N186 End stage renal disease: Secondary | ICD-10-CM | POA: Diagnosis not present

## 2019-07-17 DIAGNOSIS — Z992 Dependence on renal dialysis: Secondary | ICD-10-CM | POA: Insufficient documentation

## 2019-07-17 NOTE — Progress Notes (Addendum)
RANA, ADORNO ([id number]) Visit Report for 07/17/2019 Debridement Details Patient Name: Date of Service: Jon Anderson, Jon Anderson 07/17/2019 9:00 AM Medical Record [id number] Patient Account Number: [id number] Date of Birth/Sex: Treating RN: 1950/11/05 (69 y.o. [name] Primary Care Provider: [name] Other Clinician: Referring Provider: Treating Provider/Extender:Keri Tavella, Rachael Fee, [name] in Treatment: 1 Debridement Performed for Wound #1 Right,Lateral Foot Assessment: Performed By: Clinician Baruch Gouty, RN Debridement Type: Chemical/Enzymatic/Mechanical Agent Used: Santyl Severity of Tissue Pre Bone involvement without necrosis Debridement: Level of Consciousness (Pre- Awake and Alert procedure): Pre-procedure Verification/Time Out Taken: Yes - 10:45 Start Time: 10:46 Pain Control: Lidocaine 4% Topical Solution Bleeding: None End Time: 10:50 Procedural Pain: 0 Post Procedural Pain: 0 Response to Treatment: Procedure was tolerated well Level of Consciousness Awake and Alert (Post-procedure): Post Debridement Measurements of Total Wound Length: (cm) 0.5 Width: (cm) 1.5 Depth: (cm) 0.5 Volume: (cm) 0.295 Character of Wound/Ulcer Post Requires Further Debridement Debridement: Severity of Tissue Post Debridement: Bone involvement without necrosis Post Procedure Diagnosis Same as Pre-procedure Electronic Signature(s) Signed: 07/17/2019 5:58:56 PM By: Linton Ham MD Signed: 07/17/2019 6:08:37 PM By: Deon Pilling Entered By: Linton Ham on 07/17/2019 11:03:52 -------------------------------------------------------------------------------- HPI Details Patient Name: Date of Service: Jon Anderson 07/17/2019 9:00 AM Medical Record [id number] Patient Account Number: [id number] Date of Birth/Sex: Treating RN: 1950-05-16 (69 y.o. [name] Primary Care Provider: [name] Other Clinician: Referring Provider:  Treating Provider/Extender:Marlean Mortell, Rachael Fee, [name] in Treatment: 1 History of Present Illness HPI Description: 07/09/2019 upon evaluation today patient appears to be doing somewhat poorly unfortunately in regard to the wound on his right lateral foot distally. He has been seen by Dr. March Rummage and has been under his care since January. He did have an arterial study on April 15, 2019 which did not appear to be too good at this point in my opinion. He was noncompressible in regard to the ABIs with abnormal TBI's that were actually measured at absent. With that being said the patient appears to have significant pain which is even to light touch I am not even sure how much we can be able to accomplish with him at this point secondary to this. I do believe Santyl could be of benefit. With that being said he does have a history of diabetes mellitus type 2 which is diet controlled his last hemoglobin A1c was 6.5 that was in September of last year. The patient also has what appears to be peripheral vascular disease he has had previous intervention on the left lower extremity. He also is on renal dialysis at this point. The patient is obviously frustrated with the situation at this time he is not had an MRI at this point either. He did have an allograft placed following debridement that was on May 13, 2019 by Dr. March Rummage. Unfortunately this really did not do much for him. He has had a prior x-ray by Dr. March Rummage as well which did not reveal obvious signs of osteomyelitis but despite this I am concerned about the possibility of a bone infection just due to the chronic and longstanding nature of this wound despite aggressive interventions up to this point. He has been using mupirocin ointment on it currently. 07/17/2019; this is a patient who was admitted to the clinic last week by Jeri Cos. He has a history of known PAD and has a difficult wound over the fifth metatarsal head of the right foot that  probes to bone. He has an MRI booked for April 12  but he has noninvasive studies apparently at vein and vascular on May 6 The patient has clear claudication probably even claudication at rest. He has been using Santyl to the wound. He has a surgical shoe Addendum 07/18/2019; I have reviewed Dr. Kennon Holter notes on this patient. He had noninvasive arterial studies on 04/15/2019. He had noncompressible ABIs on the right which were listed as "barely biphasic" with an absent great toe pressure. On the left again noncompressible with barely biphasic waveforms. His great toe pressure was absent. At the time I do not think by reviewing Dr. Kennon Holter notes on 3/3 there was felt to be a worrisome wound. He has apparently had recurrent calluses worked on by Dr. Milinda Pointer including a collagen implantation. After talking to the patient yesterday I am not completely sure when this wound actually happened. However the wound is a small wound but worrisome with necrotic tissue 100% probes easily to bone. Also I think the patient actually describes rest pain. He is on dialysis so there should not be an issue of concern with regards to contrast dye. I think he needs angiography Electronic Signature(s) Signed: 07/21/2019 9:07:11 AM By: Linton Ham MD Previous Signature: 07/17/2019 5:58:56 PM Version By: Linton Ham MD Entered By: Linton Ham on 07/18/2019 07:57:11 -------------------------------------------------------------------------------- Physical Exam Details Patient Name: Date of Service: Jon Anderson, Jon Anderson 07/17/2019 9:00 AM Medical Record [id number] Patient Account Number: [id number] Date of Birth/Sex: Treating RN: 1951-03-06 (69 y.o. [name] Primary Care Provider: [name] Other Clinician: Referring Provider: Treating Provider/Extender:Kamesha Herne, Rachael Fee, [name] in Treatment: 1 Constitutional Sitting or standing Blood Pressure is within target range for patient.. Pulse  regular and within target range for patient.Marland Kitchen Respirations regular, non-labored and within target range.. Temperature is normal and within the target range for the patient.Marland Kitchen Appears in no distress. Cardiovascular Very faint right dorsalis pedis pulse none palpable at the popliteal, posterior tibia or femoral. Notes Wound exam; small wound over the right fifth metatarsal head. This is necrotic material and easily probes to bone with a skinny. Very painful. Electronic Signature(s) Signed: 07/17/2019 5:58:56 PM By: Linton Ham MD Entered By: Linton Ham on 07/17/2019 11:06:58 -------------------------------------------------------------------------------- Physician Orders Details Patient Name: Date of Service: Jon Anderson, Jon Anderson 07/17/2019 9:00 AM Medical Record [id number] Patient Account Number: [id number] Date of Birth/Sex: Treating RN: 1950-04-20 (69 y.o. [name] Primary Care Provider: [name] Other Clinician: Referring Provider: Treating Provider/Extender:Jeanclaude Wentworth, Rachael Fee, [name] in Treatment: 1 Verbal / Phone Orders: No Diagnosis Coding ICD-10 Coding Code Description E11.621 Type 2 diabetes mellitus with foot ulcer L97.512 Non-pressure chronic ulcer of other part of right foot with fat layer exposed I73.89 Other specified peripheral vascular diseases Z99.2 Dependence on renal dialysis N18.6 End stage renal disease Follow-up Appointments Return Appointment in 1 week. - Thursday Dressing Change Frequency Wound #1 Right,Lateral Foot Change dressing every day. Skin Barriers/Peri-Wound Care Barrier cream - apply around the wound bed. patient can purchase zinc oxide cream over the counter. Wound Cleansing Wound #1 Right,Lateral Foot May shower and wash wound with soap and water. Primary Wound Dressing Wound #1 Right,Lateral Foot Santyl Ointment Secondary Dressing Wound #1 Right,Lateral Foot Foam Border - or bordered  gauze. Off-Loading Open toe surgical shoe to: - right foot Electronic Signature(s) Signed: 07/17/2019 5:58:56 PM By: Linton Ham MD Signed: 07/17/2019 6:08:37 PM By: Deon Pilling Entered By: Deon Pilling on 07/17/2019 10:36:09 -------------------------------------------------------------------------------- Problem List Details Patient Name: Date of Service: Jon Anderson, Jon Anderson 07/17/2019 9:00 AM Medical Record [id number] Patient Account Number:  [id number] Date of Birth/Sex: Treating RN: October 16, 1950 (69 y.o. [name] Primary Care Provider: [name] Other Clinician: Referring Provider: Treating Provider/Extender:Tristian Sickinger, Rachael Fee, [name] in Treatment: 1 Active Problems ICD-10 Evaluated Encounter Code Description Active Date Today Diagnosis E11.621 Type 2 diabetes mellitus with foot ulcer 07/09/2019 No Yes L97.512 Non-pressure chronic ulcer of other part of right foot 07/09/2019 No Yes with fat layer exposed I73.89 Other specified peripheral vascular diseases 07/09/2019 No Yes Z99.2 Dependence on renal dialysis 07/09/2019 No Yes N18.6 End stage renal disease 07/09/2019 No Yes Inactive Problems Resolved Problems Electronic Signature(s) Signed: 07/17/2019 5:58:56 PM By: Linton Ham MD Entered By: Linton Ham on 07/17/2019 11:03:33 -------------------------------------------------------------------------------- Progress Note Details Patient Name: Date of Service: Jon Anderson 07/17/2019 9:00 AM Medical Record [id number] Patient Account Number: [id number] Date of Birth/Sex: Treating RN: 09/28/1950 (69 y.o. [name] Primary Care Provider: [name] Other Clinician: Referring Provider: Treating Provider/Extender:Daronte Shostak, Rachael Fee, [name] in Treatment: 1 Subjective History of Present Illness (HPI) 07/09/2019 upon evaluation today patient appears to be doing somewhat poorly unfortunately in regard to the wound on his  right lateral foot distally. He has been seen by Dr. March Rummage and has been under his care since January. He did have an arterial study on April 15, 2019 which did not appear to be too good at this point in my opinion. He was noncompressible in regard to the ABIs with abnormal TBI's that were actually measured at absent. With that being said the patient appears to have significant pain which is even to light touch I am not even sure how much we can be able to accomplish with him at this point secondary to this. I do believe Santyl could be of benefit. With that being said he does have a history of diabetes mellitus type 2 which is diet controlled his last hemoglobin A1c was 6.5 that was in September of last year. The patient also has what appears to be peripheral vascular disease he has had previous intervention on the left lower extremity. He also is on renal dialysis at this point. The patient is obviously frustrated with the situation at this time he is not had an MRI at this point either. He did have an allograft placed following debridement that was on May 13, 2019 by Dr. March Rummage. Unfortunately this really did not do much for him. He has had a prior x-ray by Dr. March Rummage as well which did not reveal obvious signs of osteomyelitis but despite this I am concerned about the possibility of a bone infection just due to the chronic and longstanding nature of this wound despite aggressive interventions up to this point. He has been using mupirocin ointment on it currently. 07/17/2019; this is a patient who was admitted to the clinic last week by Jeri Cos. He has a history of known PAD and has a difficult wound over the fifth metatarsal head of the right foot that probes to bone. He has an MRI booked for April 12 but he has noninvasive studies apparently at vein and vascular on May 6 The patient has clear claudication probably even claudication at rest. He has been using Santyl to the wound. He has a  surgical shoe Addendum 07/18/2019; I have reviewed Dr. Kennon Holter notes on this patient. He had noninvasive arterial studies on 04/15/2019. He had noncompressible ABIs on the right which were listed as "barely biphasic" with an absent great toe pressure. On the left again noncompressible with barely biphasic waveforms. His great toe  pressure was absent. At the time I do not think by reviewing Dr. Kennon Holter notes on 3/3 there was felt to be a worrisome wound. He has apparently had recurrent calluses worked on by Dr. Milinda Pointer including a collagen implantation. After talking to the patient yesterday I am not completely sure when this wound actually happened. However the wound is a small wound but worrisome with necrotic tissue 100% probes easily to bone. Also I think the patient actually describes rest pain. He is on dialysis so there should not be an issue of concern with regards to contrast dye. I think he needs angiography Objective Constitutional Sitting or standing Blood Pressure is within target range for patient.. Pulse regular and within target range for patient.Marland Kitchen Respirations regular, non-labored and within target range.. Temperature is normal and within the target range for the patient.Marland Kitchen Appears in no distress. Vitals Time Taken: 9:34 AM, Height: 71 in, Source: Stated, Weight: 150 lbs, Source: Stated, BMI: 20.9, Temperature: 98.0 F, Pulse: 70 bpm, Respiratory Rate: 18 breaths/min, Blood Pressure: 126/64 mmHg, Capillary Blood Glucose: 98 mg/dl. General Notes: glucose per pt report yesterday Cardiovascular Very faint right dorsalis pedis pulse none palpable at the popliteal, posterior tibia or femoral. General Notes: Wound exam; small wound over the right fifth metatarsal head. This is necrotic material and easily probes to bone with a skinny. Very painful. Integumentary (Hair, Skin) Wound #1 status is Open. Original cause of wound was Gradually Appeared. The wound is located on the Right,Lateral  Foot. The wound measures 0.5cm length x 1.5cm width x 0.5cm depth; 0.589cm^2 area and 0.295cm^3 volume. There is Fat Layer (Subcutaneous Tissue) Exposed exposed. There is no tunneling noted, however, there is undermining starting at 12:00 and ending at 12:00 with a maximum distance of 0.4cm. There is a medium amount of serous drainage noted. The wound margin is flat and intact. There is no granulation within the wound bed. There is a large (67-100%) amount of necrotic tissue within the wound bed including Adherent Slough. General Notes: macerated wound edges Assessment Active Problems ICD-10 Type 2 diabetes mellitus with foot ulcer Non-pressure chronic ulcer of other part of right foot with fat layer exposed Other specified peripheral vascular diseases Dependence on renal dialysis End stage renal disease Procedures Wound #1 Pre-procedure diagnosis of Wound #1 is a Diabetic Wound/Ulcer of the Lower Extremity located on the Right,Lateral Foot .Severity of Tissue Pre Debridement is: Bone involvement without necrosis. There was a Chemical/Enzymatic/Mechanical debridement performed by Baruch Gouty, RN. after achieving pain control using Lidocaine 4% Topical Solution. Agent used was [employer]. A time out was conducted at 10:45, prior to the start of the procedure. There was no bleeding. The procedure was tolerated well with a pain level of 0 throughout and a pain level of 0 following the procedure. Post Debridement Measurements: 0.5cm length x 1.5cm width x 0.5cm depth; 0.295cm^3 volume. Character of Wound/Ulcer Post Debridement requires further debridement. Severity of Tissue Post Debridement is: Bone involvement without necrosis. Post procedure Diagnosis Wound #1: Same as Pre-Procedure Plan Follow-up Appointments: Return Appointment in 1 week. - Thursday Dressing Change Frequency: Wound #1 Right,Lateral Foot: Change dressing every day. Skin Barriers/Peri-Wound Care: Barrier cream - apply  around the wound bed. patient can purchase zinc oxide cream over the counter. Wound Cleansing: Wound #1 Right,Lateral Foot: May shower and wash wound with soap and water. Primary Wound Dressing: Wound #1 Right,Lateral Foot: Santyl Ointment Secondary Dressing: Wound #1 Right,Lateral Foot: Foam Border - or bordered gauze. Off-Loading: Open toe surgical shoe  to: - right foot 1. Right lateral foot. This is not a viable wound and probes to bone 2. I agree with the unenhanced MRI [chronic renal failure on dialysis] 3. I think this man is going to need an angiogram and probably needs it sooner than later. I will review Dr. Kennon Holter notes on this patient from when he last saw him in January. At that point he was seeing Dr. March Rummage of podiatry for this wound. I cannot feel pulses and I think there is probably insufficient blood flow to this wound to generate healing. 4. I have added a sense of urgency to this. After reviewing his past records with Dr. Gwenlyn Found on the right side including his noninvasive studies and Dr. Kennon Holter last review of this I will see if I need to contact him to get the angiogram moved up more expediently. ADDEMDUM 07/18/19 5; I have reviewed Dr. Kennon Holter notes from when he last saw him actually in March on March 3. I have quoted these results in the HPI. His noninvasive studies were done in January. I am not completely certain from the tone of Dr. Kennon Holter notes that he was aware there was a wound present if indeed there was a wound present in March. I have not reviewed podiatry notes on the subject. The patient has an MRI next week, if he has osteomyelitis he will need antibiotics and potentially hyperbaric oxygen therapy. 6. I will reach out for Dr. Kennon Holter review of this. I do not think he needs at this point to see vein and vascular in particular additional noninvasive studies that were booked for May 5. I actually think he probably needs angiography. Electronic  Signature(s) Signed: 07/21/2019 9:07:11 AM By: Linton Ham MD Previous Signature: 07/17/2019 5:58:56 PM Version By: Linton Ham MD Entered By: Linton Ham on 07/18/2019 08:00:52 -------------------------------------------------------------------------------- SuperBill Details Patient Name: Date of Service: Jon Anderson, Jon Anderson 07/17/2019 Medical Record [id number] Patient Account Number: [id number] Date of Birth/Sex: Treating RN: 1950/07/17 (69 y.o. [name] Primary Care Provider: [name] Other Clinician: Referring Provider: Treating Provider/Extender:Jahmere Bramel, Rachael Fee, [name] in Treatment: 1 Diagnosis Coding ICD-10 Codes Code Description E11.621 Type 2 diabetes mellitus with foot ulcer L97.512 Non-pressure chronic ulcer of other part of right foot with fat layer exposed I73.89 Other specified peripheral vascular diseases Z99.2 Dependence on renal dialysis N18.6 End stage renal disease Facility Procedures CPT4 Code: [id number] Description: [phone] - DEBRIDE W/O ANES NON SELECT Modifier: Quantity: 1 Physician Procedures CPT4 Code Description: [id number] - WC PHYS LEVEL 4 - EST PT ICD-10 Diagnosis Description E11.621 Type 2 diabetes mellitus with foot ulcer L97.512 Non-pressure chronic ulcer of other part of right foot I73.89 Other specified peripheral vascular  diseases Modifier: with fat layer e Quantity: 1 xposed Electronic Signature(s) Signed: 07/17/2019 5:58:56 PM By: Linton Ham MD Entered By: Linton Ham on 07/17/2019 11:09:46

## 2019-07-18 NOTE — Progress Notes (Signed)
Jon Anderson, Jon Anderson ([id number]) Visit Report for 07/17/2019 Arrival Information Details Patient Name: Date of Service: Jon Anderson, Jon Anderson 07/17/2019 9:00 AM Medical Record [id number] Patient Account Number: [id number] Date of Birth/Sex: Treating RN: 1950/10/03 (69 y.o. Jon Anderson Primary Care Jon Anderson: Jon Anderson Other Clinician: Referring Jon Anderson: Treating Jon Anderson/Extender:Jon Anderson in Treatment: 1 Visit Information History Since Last Visit Added or deleted any medications: No Patient Arrived: Jon Anderson Any new allergies or adverse reactions: No Arrival Time: 09:31 Had a fall or experienced change in No Accompanied By: self activities of daily living that may affect Transfer Assistance: None risk of falls: Patient Identification Verified: Yes Signs or symptoms of abuse/neglect since last No Secondary Verification Process Yes visito Completed: Hospitalized since last visit: No Patient Requires Transmission- No Implantable device outside of the clinic excluding No Based Precautions: cellular tissue based products placed in the center Patient Has Alerts: Yes since last visit: Patient Alerts: R ABI non Has Dressing in Place as Prescribed: Yes compressible Pain Present Now: No Electronic Signature(s) Signed: 07/18/2019 6:10:03 PM By: Baruch Gouty RN, BSN Entered By: Baruch Gouty on 07/17/2019 09:34:27 -------------------------------------------------------------------------------- Encounter Discharge Information Details Patient Name: Date of Service: Jon Anderson. 07/17/2019 9:00 AM Medical Record [id number] Patient Account Number: [id number] Date of Birth/Sex: Treating RN: 04-14-1950 (69 y.o. Jon Anderson Primary Care Jon Anderson: Jon Anderson Other Clinician: Referring Jesyka Slaght: Treating Jon Anderson/Extender:Jon Anderson in Treatment: 1 Encounter Discharge Information Items Discharge  Condition: Stable Ambulatory Status: Cane Discharge Destination: Home Transportation: Private Auto Accompanied By: self Schedule Follow-up Appointment: Yes Clinical Summary of Care: Patient Declined Electronic Signature(s) Signed: 07/18/2019 6:10:03 PM By: Baruch Gouty RN, BSN Entered By: Baruch Gouty on 07/17/2019 10:49:04 -------------------------------------------------------------------------------- Lower Extremity Assessment Details Patient Name: Date of Service: Jon Anderson, Jon Anderson 07/17/2019 9:00 AM Medical Record [id number] Patient Account Number: [id number] Date of Birth/Sex: Treating RN: [birth year] (68 y.o. Jon Anderson Primary Care Jon Anderson: Jon Anderson Other Clinician: Referring Lavaeh Bau: Treating Jon Anderson/Extender:Jon Anderson in Treatment: 1 Edema Assessment Assessed: [Left: No] [Right: No] Edema: [Left: Ye] [Right: s] Calf Left: Right: Point of Measurement: 32 cm From Medial Instep cm 30 cm Ankle Left: Right: Point of Measurement: 10 cm From Medial Instep cm 22 cm Vascular Assessment Pulses: Dorsalis Pedis Palpable: [Right:No] Electronic Signature(s) Signed: 07/18/2019 6:10:03 PM By: Baruch Gouty RN, BSN Entered By: Baruch Gouty on 07/17/2019 09:38:38 -------------------------------------------------------------------------------- Multi Wound Chart Details Patient Name: Date of Service: Jon Anderson 07/17/2019 9:00 AM Medical Record [id number] Patient Account Number: [id number] Date of Birth/Sex: Treating RN: 30-Aug-1950 (69 y.o. Jon Anderson Primary Care Analeigh Aries: Jon Anderson Other Clinician: Referring Shamarie Call: Treating Jon Anderson/Extender:Jon Anderson in Treatment: 1 Vital Signs Height(in): 71 Capillary Blood 98 Glucose(mg/dl): Weight(lbs): 150 Pulse(bpm): 78 Body Mass Index(BMI): 21 Blood Pressure(mmHg): 126/64 Temperature(F):  98.0 Respiratory 18 Rate(breaths/min): Photos: [1:No Photos] [N/A:N/A] Wound Location: [1:Right, Lateral Foot] [N/A:N/A] Wounding Event: [1:Gradually Appeared] [N/A:N/A] Primary Etiology: [1:Diabetic Wound/Ulcer of the N/A Lower Extremity] Comorbid History: [1:Congestive Heart Failure, N/A Coronary Artery Disease, Hypertension, Peripheral Arterial Disease, Type II Diabetes, End Stage Renal Disease, Neuropathy] Date Acquired: [1:04/25/2019] [N/A:N/A] Weeks of Treatment: [1:1] [N/A:N/A] Wound Status: [1:Open] [N/A:N/A] Measurements L x W x D 0.5x1.5x0.5 [N/A:N/A] (cm) Area (cm) : [1:0.589] [N/A:N/A] Volume (cm) : [1:0.295] [N/A:N/A] % Reduction in Area: [1:44.90%] [N/A:N/A] % Reduction in Volume: 7.80% [N/A:N/A] Starting Position 1 12 (o'clock): Ending Position 1 [1:12] (o'clock): Maximum Distance 1 [1:0.4] (cm): Undermining: [1:Yes] [N/A:N/A] Classification: [1:Grade 2] [N/A:N/A] Exudate  Amount: [1:Medium] [N/A:N/A] Exudate Type: [1:Serous] [N/A:N/A] Exudate Color: [1:amber] [N/A:N/A] Wound Margin: [1:Flat and Intact] [N/A:N/A] Granulation Amount: [1:None Present (0%)] [N/A:N/A] Necrotic Amount: [1:Large (67-100%)] [N/A:N/A] Exposed Structures: [1:Fat Layer (Subcutaneous N/A Tissue) Exposed: Yes Fascia: No Tendon: No Muscle: No Joint: No Bone: No] Epithelialization: [1:None] [N/A:N/A] Debridement: [1:Chemical/Enzymatic/Mechanical] [N/A:N/A] Pre-procedure [1:10:45] [N/A:N/A] Verification/Time Out Taken: Pain Control: [1:Lidocaine 4% Topical Solution] [N/A:N/A] Instrument: [1:N/A] [N/A:N/A] Bleeding: [1:None] [N/A:N/A] Procedural Pain: [1:0] [N/A:N/A] Post Procedural Pain: [1:0] [N/A:N/A] Debridement Treatment [1:Procedure was tolerated] [N/A:N/A] Response: [1:well] Post Debridement [1:0.5x1.5x0.5] [N/A:N/A] Measurements L x W x D (cm) Post Debridement [1:0.295] [N/A:N/A] Volume: (cm) Assessment Notes: [1:macerated wound edges] [N/A:N/A N/A] Treatment Notes Wound  #1 (Right, Lateral Foot) 2. Periwound Care Barrier cream 3. Primary Dressing Applied Santyl 4. Secondary Dressing Foam Border Dressing 5. Secured With Recruitment consultant) Signed: 07/17/2019 5:58:56 PM By: Linton Ham MD Signed: 07/17/2019 6:08:37 PM By: Deon Pilling Entered By: Linton Ham on 07/17/2019 11:03:40 -------------------------------------------------------------------------------- Multi-Disciplinary Care Plan Details Patient Name: Date of Service: Jon Anderson, Jon Anderson 07/17/2019 9:00 AM Medical Record [id number] Patient Account Number: [id number] Date of Birth/Sex: Treating RN: 27-Jul-1950 (69 y.o. Lorette Ang, Meta.Reding Primary Care Daliya Parchment: Jon Anderson Other Clinician: Referring Jadene Stemmer: Treating Daymein Nunnery/Extender:Jon Anderson in Treatment: 1 Active Inactive Abuse / Safety / Falls / Self Care Management Nursing Diagnoses: Potential for falls Goals: Patient/caregiver will verbalize/demonstrate measures taken to prevent injury and/or falls Date Initiated: 07/09/2019 Target Resolution Date: 08/06/2019 Goal Status: Active Interventions: Assess fall risk on admission and as needed Assess impairment of mobility on admission and as needed per policy Notes: Nutrition Nursing Diagnoses: Impaired glucose control: actual or potential Potential for alteratiion in Nutrition/Potential for imbalanced nutrition Goals: Patient/caregiver will maintain therapeutic glucose control Date Initiated: 07/09/2019 Target Resolution Date: 08/06/2019 Goal Status: Active Interventions: Provide education on elevated blood sugars and impact on wound healing Treatment Activities: Patient referred to Primary Care Physician for further nutritional evaluation : 07/09/2019 Notes: Wound/Skin Impairment Nursing Diagnoses: Impaired tissue integrity Knowledge deficit related to ulceration/compromised skin integrity Goals: Patient/caregiver will verbalize  understanding of skin care regimen Date Initiated: 07/09/2019 Date Inactivated: 07/17/2019 Target Resolution Date: 08/06/2019 Goal Status: Met Ulcer/skin breakdown will have a volume reduction of 30% by week 4 Date Initiated: 07/09/2019 Target Resolution Date: 08/06/2019 Goal Status: Active Interventions: Assess patient/caregiver ability to obtain necessary supplies Assess patient/caregiver ability to perform ulcer/skin care regimen upon admission and as needed Assess ulceration(s) every visit Treatment Activities: Skin care regimen initiated : 07/09/2019 Topical wound management initiated : 07/09/2019 Notes: Electronic Signature(s) Signed: 07/17/2019 6:08:37 PM By: Deon Pilling Entered By: Deon Pilling on 07/17/2019 09:28:41 -------------------------------------------------------------------------------- Pain Assessment Details Patient Name: Date of Service: Jon Anderson, Jon Anderson 07/17/2019 9:00 AM Medical Record [id number] Patient Account Number: [id number] Date of Birth/Sex: Treating RN: [birth year] (69 y.o. Jon Anderson Primary Care Dantavious Snowball: Jon Anderson Other Clinician: Referring Shalonda Sachse: Treating Tejasvi Brissett/Extender:Jon Anderson in Treatment: 1 Active Problems Location of Pain Severity and Description of Pain Patient Has Paino No Site Locations With Dressing Change: Yes Duration of the Pain. Constant / Intermittento Intermittent Rate the pain. Current Pain Level: 0 Worst Pain Level: 5 Least Pain Level: 0 Character of Pain Describe the Pain: Sharp, Shooting, Tender Pain Management and Medication Current Pain Management: Medication: Yes Other: time Is the Current Pain Management Adequate: Adequate How does your wound impact your activities of daily livingo Sleep: Yes Bathing: No Appetite: No Relationship With Others: No Bladder Continence: No Emotions: No Bowel Continence: No  Work: No Toileting: No Drive: No Dressing: No Hobbies:  No Engineer, maintenance) Signed: 07/18/2019 6:10:03 PM By: Baruch Gouty RN, BSN Entered By: Baruch Gouty on 07/17/2019 09:36:36 -------------------------------------------------------------------------------- Patient/Caregiver Education Details Patient Name: Date of Service: Jon Anderson, Jon L. 4/8/2021andnbsp9:00 AM Medical Record [id number] Patient Account Number: [id number] Date of Birth/Gender: Treating RN: 03/23/1951 (68 y.o. Jon Anderson Primary Care Physician: Jon Anderson Other Clinician: Referring Physician: Treating Physician/Extender:Jon Anderson in Treatment: 1 Education Assessment Education Provided To: Patient Education Topics Provided Elevated Blood Sugar/ Impact on Healing: Handouts: Elevated Blood Sugars: How Do They Affect Wound Healing Methods: Explain/Verbal Responses: Reinforcements needed Electronic Signature(s) Signed: 07/17/2019 6:08:37 PM By: Deon Pilling Entered By: Deon Pilling on 07/17/2019 09:28:52 -------------------------------------------------------------------------------- Wound Assessment Details Patient Name: Date of Service: Jon Anderson, Jon Anderson 07/17/2019 9:00 AM Medical Record [id number] Patient Account Number: [id number] Date of Birth/Sex: Treating RN: 1950-09-25 (69 y.o. Jon Anderson Primary Care Janita Camberos: Jon Anderson Other Clinician: Referring Kavaughn Faucett: Treating Ahman Dugdale/Extender:Jon Anderson in Treatment: 1 Wound Status Wound Number: 1 Primary Diabetic Wound/Ulcer of the Lower Extremity Etiology: Wound Location: Right, Lateral Foot Wound Open Wounding Event: Gradually Appeared Status: Date Acquired: 04/25/2019 Comorbid Congestive Heart Failure, Coronary Artery Weeks Of Treatment: 1 History: Disease, Hypertension, Peripheral Arterial Clustered Wound: No Disease, Type II Diabetes, End Stage Renal Disease, Neuropathy Wound Measurements Length: (cm)  0.5 Width: (cm) 1.5 Depth: (cm) 0.5 Area: (cm) 0.589 Volume: (cm) 0.295 % Reduction in Area: 44.9% % Reduction in Volume: 7.8% Epithelialization: None Tunneling: No Undermining: Yes Starting Position (o'clock): 12 Ending Position (o'clock): 12 Maximum Distance: (cm) 0.4 Wound Description Classification: Grade 2 Wound Margin: Flat and Intact Exudate Amount: Medium Exudate Type: Serous Exudate Color: amber Wound Bed Granulation Amount: None Present (0%) Necrotic Amount: Large (67-100%) Necrotic Quality: Adherent Slough Foul Odor After Cleansing: No Slough/Fibrino Yes Exposed Structure Fascia Exposed: No Fat Layer (Subcutaneous Tissue) Exposed: Yes Tendon Exposed: No Muscle Exposed: No Joint Exposed: No Bone Exposed: No Assessment Notes macerated wound edges Treatment Notes Wound #1 (Right, Lateral Foot) 2. Periwound Care Barrier cream 3. Primary Dressing Applied Santyl 4. Secondary Dressing Foam Border Dressing 5. Secured With Recruitment consultant) Signed: 07/18/2019 6:10:03 PM By: Baruch Gouty RN, BSN Entered By: Baruch Gouty on 07/17/2019 09:44:55 -------------------------------------------------------------------------------- Jon Anderson Details Patient Name: Date of Service: Jon Anderson 07/17/2019 9:00 AM Medical Record [id number] Patient Account Number: [id number] Date of Birth/Sex: Treating RN: [birth year] (69 y.o. Jon Anderson Primary Care Bryn Saline: Jon Anderson Other Clinician: Referring Jamea Robicheaux: Treating Davionna Blacksher/Extender:Jon Anderson in Treatment: 1 Vital Signs Time Taken: 09:34 Temperature (F): 98.0 Height (in): 71 Pulse (bpm): 70 Source: Stated Respiratory Rate (breaths/min): 18 Weight (lbs): 150 Blood Pressure (mmHg): 126/64 Source: Stated Capillary Blood Glucose (mg/dl): 98 Body Mass Index (BMI): 20.9 Reference Range: 80 - 120 mg / dl Notes glucose per pt report  yesterday Electronic Signature(s) Signed: 07/18/2019 6:10:03 PM By: Baruch Gouty RN, BSN Entered By: Baruch Gouty on 07/17/2019 09:35:41

## 2019-07-22 ENCOUNTER — Ambulatory Visit (HOSPITAL_COMMUNITY)
Admission: RE | Admit: 2019-07-22 | Discharge: 2019-07-22 | Disposition: A | Discharge status: Home / Self Care | Payer: Medicare Other | Source: Ambulatory Visit | Attending: Physician Assistant | Admitting: Physician Assistant

## 2019-07-22 ENCOUNTER — Other Ambulatory Visit: Payer: Self-pay

## 2019-07-22 DIAGNOSIS — E11621 Type 2 diabetes mellitus with foot ulcer: Secondary | ICD-10-CM

## 2019-07-22 DIAGNOSIS — L97512 Non-pressure chronic ulcer of other part of right foot with fat layer exposed: Secondary | ICD-10-CM

## 2019-07-22 DIAGNOSIS — L97509 Non-pressure chronic ulcer of other part of unspecified foot with unspecified severity: Secondary | ICD-10-CM | POA: Insufficient documentation

## 2019-07-22 DIAGNOSIS — E1169 Type 2 diabetes mellitus with other specified complication: Secondary | ICD-10-CM | POA: Insufficient documentation

## 2019-07-22 DIAGNOSIS — M869 Osteomyelitis, unspecified: Secondary | ICD-10-CM | POA: Insufficient documentation

## 2019-07-22 MED ORDER — GADOBUTROL 1 MMOL/ML IV SOLN
8.0000 mL | Freq: Once | INTRAVENOUS | Status: AC | PRN
  Administered 2019-07-22: 8 mL via INTRAVENOUS

## 2019-07-24 ENCOUNTER — Inpatient Hospital Stay (HOSPITAL_COMMUNITY)
Admission: EM | Admit: 2019-07-24 | Discharge: 2019-07-28 | DRG: 617 | Disposition: A | Discharge status: Home Health | Payer: Medicare Other | Attending: Internal Medicine | Admitting: Internal Medicine

## 2019-07-24 ENCOUNTER — Encounter (HOSPITAL_COMMUNITY): Payer: Medicare Other

## 2019-07-24 ENCOUNTER — Encounter (HOSPITAL_COMMUNITY): Payer: Self-pay

## 2019-07-24 ENCOUNTER — Other Ambulatory Visit: Payer: Self-pay

## 2019-07-24 ENCOUNTER — Encounter (HOSPITAL_BASED_OUTPATIENT_CLINIC_OR_DEPARTMENT_OTHER): Payer: Medicare Other | Admitting: Internal Medicine

## 2019-07-24 DIAGNOSIS — Z01810 Encounter for preprocedural cardiovascular examination: Secondary | ICD-10-CM | POA: Diagnosis not present

## 2019-07-24 DIAGNOSIS — I10 Essential (primary) hypertension: Secondary | ICD-10-CM | POA: Diagnosis present

## 2019-07-24 DIAGNOSIS — N186 End stage renal disease: Secondary | ICD-10-CM

## 2019-07-24 DIAGNOSIS — L02611 Cutaneous abscess of right foot: Secondary | ICD-10-CM | POA: Diagnosis present

## 2019-07-24 DIAGNOSIS — Z20822 Contact with and (suspected) exposure to covid-19: Secondary | ICD-10-CM | POA: Diagnosis present

## 2019-07-24 DIAGNOSIS — I272 Pulmonary hypertension, unspecified: Secondary | ICD-10-CM | POA: Diagnosis present

## 2019-07-24 DIAGNOSIS — K219 Gastro-esophageal reflux disease without esophagitis: Secondary | ICD-10-CM | POA: Diagnosis present

## 2019-07-24 DIAGNOSIS — L97412 Non-pressure chronic ulcer of right heel and midfoot with fat layer exposed: Secondary | ICD-10-CM | POA: Diagnosis present

## 2019-07-24 DIAGNOSIS — E08621 Diabetes mellitus due to underlying condition with foot ulcer: Secondary | ICD-10-CM | POA: Diagnosis not present

## 2019-07-24 DIAGNOSIS — Z91018 Allergy to other foods: Secondary | ICD-10-CM

## 2019-07-24 DIAGNOSIS — L03115 Cellulitis of right lower limb: Secondary | ICD-10-CM | POA: Diagnosis present

## 2019-07-24 DIAGNOSIS — I132 Hypertensive heart and chronic kidney disease with heart failure and with stage 5 chronic kidney disease, or end stage renal disease: Secondary | ICD-10-CM | POA: Diagnosis present

## 2019-07-24 DIAGNOSIS — M609 Myositis, unspecified: Secondary | ICD-10-CM | POA: Diagnosis present

## 2019-07-24 DIAGNOSIS — E119 Type 2 diabetes mellitus without complications: Secondary | ICD-10-CM

## 2019-07-24 DIAGNOSIS — E1151 Type 2 diabetes mellitus with diabetic peripheral angiopathy without gangrene: Secondary | ICD-10-CM | POA: Diagnosis present

## 2019-07-24 DIAGNOSIS — E8889 Other specified metabolic disorders: Secondary | ICD-10-CM | POA: Diagnosis present

## 2019-07-24 DIAGNOSIS — E785 Hyperlipidemia, unspecified: Secondary | ICD-10-CM | POA: Diagnosis present

## 2019-07-24 DIAGNOSIS — I5032 Chronic diastolic (congestive) heart failure: Secondary | ICD-10-CM | POA: Diagnosis present

## 2019-07-24 DIAGNOSIS — G894 Chronic pain syndrome: Secondary | ICD-10-CM | POA: Diagnosis present

## 2019-07-24 DIAGNOSIS — E1169 Type 2 diabetes mellitus with other specified complication: Principal | ICD-10-CM | POA: Diagnosis present

## 2019-07-24 DIAGNOSIS — E11621 Type 2 diabetes mellitus with foot ulcer: Secondary | ICD-10-CM | POA: Diagnosis present

## 2019-07-24 DIAGNOSIS — Z794 Long term (current) use of insulin: Secondary | ICD-10-CM | POA: Diagnosis not present

## 2019-07-24 DIAGNOSIS — Z91048 Other nonmedicinal substance allergy status: Secondary | ICD-10-CM

## 2019-07-24 DIAGNOSIS — I739 Peripheral vascular disease, unspecified: Secondary | ICD-10-CM | POA: Diagnosis present

## 2019-07-24 DIAGNOSIS — Z7982 Long term (current) use of aspirin: Secondary | ICD-10-CM

## 2019-07-24 DIAGNOSIS — M869 Osteomyelitis, unspecified: Secondary | ICD-10-CM | POA: Diagnosis present

## 2019-07-24 DIAGNOSIS — Z993 Dependence on wheelchair: Secondary | ICD-10-CM

## 2019-07-24 DIAGNOSIS — I2510 Atherosclerotic heart disease of native coronary artery without angina pectoris: Secondary | ICD-10-CM | POA: Diagnosis present

## 2019-07-24 DIAGNOSIS — M009 Pyogenic arthritis, unspecified: Secondary | ICD-10-CM | POA: Diagnosis present

## 2019-07-24 DIAGNOSIS — Z79899 Other long term (current) drug therapy: Secondary | ICD-10-CM

## 2019-07-24 DIAGNOSIS — Z8249 Family history of ischemic heart disease and other diseases of the circulatory system: Secondary | ICD-10-CM

## 2019-07-24 DIAGNOSIS — E1122 Type 2 diabetes mellitus with diabetic chronic kidney disease: Secondary | ICD-10-CM | POA: Diagnosis present

## 2019-07-24 DIAGNOSIS — Z7902 Long term (current) use of antithrombotics/antiplatelets: Secondary | ICD-10-CM

## 2019-07-24 DIAGNOSIS — E1140 Type 2 diabetes mellitus with diabetic neuropathy, unspecified: Secondary | ICD-10-CM | POA: Diagnosis present

## 2019-07-24 DIAGNOSIS — Z951 Presence of aortocoronary bypass graft: Secondary | ICD-10-CM

## 2019-07-24 DIAGNOSIS — K5900 Constipation, unspecified: Secondary | ICD-10-CM | POA: Diagnosis present

## 2019-07-24 DIAGNOSIS — G2581 Restless legs syndrome: Secondary | ICD-10-CM | POA: Diagnosis present

## 2019-07-24 DIAGNOSIS — G4733 Obstructive sleep apnea (adult) (pediatric): Secondary | ICD-10-CM | POA: Diagnosis present

## 2019-07-24 DIAGNOSIS — D631 Anemia in chronic kidney disease: Secondary | ICD-10-CM | POA: Diagnosis present

## 2019-07-24 DIAGNOSIS — E11628 Type 2 diabetes mellitus with other skin complications: Secondary | ICD-10-CM | POA: Diagnosis present

## 2019-07-24 DIAGNOSIS — N2581 Secondary hyperparathyroidism of renal origin: Secondary | ICD-10-CM | POA: Diagnosis present

## 2019-07-24 DIAGNOSIS — Z833 Family history of diabetes mellitus: Secondary | ICD-10-CM

## 2019-07-24 DIAGNOSIS — Z992 Dependence on renal dialysis: Secondary | ICD-10-CM

## 2019-07-24 DIAGNOSIS — M86271 Subacute osteomyelitis, right ankle and foot: Secondary | ICD-10-CM | POA: Diagnosis present

## 2019-07-24 DIAGNOSIS — L089 Local infection of the skin and subcutaneous tissue, unspecified: Secondary | ICD-10-CM | POA: Diagnosis not present

## 2019-07-24 DIAGNOSIS — Z888 Allergy status to other drugs, medicaments and biological substances status: Secondary | ICD-10-CM

## 2019-07-24 DIAGNOSIS — Z596 Low income: Secondary | ICD-10-CM

## 2019-07-24 LAB — CBC WITH DIFFERENTIAL/PLATELET
Abs Immature Granulocytes: 0.01 10*3/uL (ref 0.00–0.07)
Basophils Absolute: 0 10*3/uL (ref 0.0–0.1)
Basophils Relative: 1 %
Eosinophils Absolute: 0.4 10*3/uL (ref 0.0–0.5)
Eosinophils Relative: 7 %
HCT: 40.2 % (ref 39.0–52.0)
Hemoglobin: 12.3 g/dL — ABNORMAL LOW (ref 13.0–17.0)
Immature Granulocytes: 0 %
Lymphocytes Relative: 39 %
Lymphs Abs: 2.2 10*3/uL (ref 0.7–4.0)
MCH: 28.1 pg (ref 26.0–34.0)
MCHC: 30.6 g/dL (ref 30.0–36.0)
MCV: 92 fL (ref 80.0–100.0)
Monocytes Absolute: 0.5 10*3/uL (ref 0.1–1.0)
Monocytes Relative: 10 %
Neutro Abs: 2.4 10*3/uL (ref 1.7–7.7)
Neutrophils Relative %: 43 %
Platelets: 259 10*3/uL (ref 150–400)
RBC: 4.37 MIL/uL (ref 4.22–5.81)
RDW: 15.9 % — ABNORMAL HIGH (ref 11.5–15.5)
WBC: 5.5 10*3/uL (ref 4.0–10.5)
nRBC: 0 % (ref 0.0–0.2)

## 2019-07-24 LAB — COMPREHENSIVE METABOLIC PANEL
ALT: 16 U/L (ref 0–44)
AST: 19 U/L (ref 15–41)
Albumin: 3.7 g/dL (ref 3.5–5.0)
Alkaline Phosphatase: 115 U/L (ref 38–126)
Anion gap: 14 (ref 5–15)
BUN: 22 mg/dL (ref 8–23)
CO2: 30 mmol/L (ref 22–32)
Calcium: 9.2 mg/dL (ref 8.9–10.3)
Chloride: 91 mmol/L — ABNORMAL LOW (ref 98–111)
Creatinine, Ser: 7.2 mg/dL — ABNORMAL HIGH (ref 0.61–1.24)
GFR calc Af Amer: 8 mL/min — ABNORMAL LOW (ref >60)
GFR calc non Af Amer: 7 mL/min — ABNORMAL LOW (ref >60)
Glucose, Bld: 127 mg/dL — ABNORMAL HIGH (ref 70–99)
Potassium: 3.8 mmol/L (ref 3.5–5.1)
Sodium: 135 mmol/L (ref 135–145)
Total Bilirubin: 0.6 mg/dL (ref 0.3–1.2)
Total Protein: 7.8 g/dL (ref 6.5–8.1)

## 2019-07-24 LAB — GLUCOSE, CAPILLARY
Glucose-Capillary: 97 mg/dL (ref 70–99)
Glucose-Capillary: 189 mg/dL — ABNORMAL HIGH (ref 70–99)

## 2019-07-24 LAB — HEMOGLOBIN A1C
Hgb A1c MFr Bld: 6.4 % — ABNORMAL HIGH (ref 4.8–5.6)
Mean Plasma Glucose: 136.98 mg/dL

## 2019-07-24 LAB — SARS CORONAVIRUS 2 (TAT 6-24 HRS): SARS Coronavirus 2: NEGATIVE

## 2019-07-24 LAB — LACTIC ACID, PLASMA
Lactic Acid, Venous: 1.4 mmol/L (ref 0.5–1.9)
Lactic Acid, Venous: 1.2 mmol/L (ref 0.5–1.9)

## 2019-07-24 LAB — SEDIMENTATION RATE: Sed Rate: 22 mm/hr — ABNORMAL HIGH (ref 0–16)

## 2019-07-24 LAB — C-REACTIVE PROTEIN: CRP: <0.5 mg/dL (ref <1.0)

## 2019-07-24 LAB — PROTIME-INR
INR: 1 (ref 0.8–1.2)
Prothrombin Time: 12.8 seconds (ref 11.4–15.2)

## 2019-07-24 LAB — CBG MONITORING, ED: Glucose-Capillary: 115 mg/dL — ABNORMAL HIGH (ref 70–99)

## 2019-07-24 LAB — PREALBUMIN: Prealbumin: 28 mg/dL (ref 18–38)

## 2019-07-24 MED ORDER — ACETAMINOPHEN 325 MG PO TABS: 650.0000 mg | ORAL_TABLET | Freq: Four times a day (QID) | ORAL | Status: DC | PRN

## 2019-07-24 MED ORDER — GABAPENTIN 100 MG PO CAPS
100.0000 mg | ORAL_CAPSULE | Freq: Two times a day (BID) | ORAL | Status: DC
  Administered 2019-07-24 – 2019-07-28 (×7): 100 mg via ORAL
  Filled 2019-07-24 (×7): qty 1

## 2019-07-24 MED ORDER — CALCIUM ACETATE (PHOS BINDER) 667 MG PO CAPS
1334.0000 mg | ORAL_CAPSULE | Freq: Two times a day (BID) | ORAL | Status: DC
  Administered 2019-07-24 – 2019-07-28 (×7): 1334 mg via ORAL
  Filled 2019-07-24 (×10): qty 2

## 2019-07-24 MED ORDER — SUCRALFATE 1 G PO TABS
1.0000 g | ORAL_TABLET | Freq: Two times a day (BID) | ORAL | Status: DC
  Administered 2019-07-24 – 2019-07-28 (×7): 1 g via ORAL
  Filled 2019-07-24 (×7): qty 1

## 2019-07-24 MED ORDER — HYDROXYZINE HCL 25 MG PO TABS: 25.0000 mg | ORAL_TABLET | Freq: Three times a day (TID) | ORAL | Status: DC | PRN

## 2019-07-24 MED ORDER — TRAMADOL HCL 50 MG PO TABS
50.0000 mg | ORAL_TABLET | Freq: Three times a day (TID) | ORAL | Status: DC
  Administered 2019-07-24 – 2019-07-28 (×11): 50 mg via ORAL
  Filled 2019-07-24 (×11): qty 1

## 2019-07-24 MED ORDER — NEPRO/CARBSTEADY PO LIQD
237.0000 mL | Freq: Three times a day (TID) | ORAL | Status: DC | PRN
  Filled 2019-07-24: qty 237

## 2019-07-24 MED ORDER — METRONIDAZOLE IN NACL 5-0.79 MG/ML-% IV SOLN
500.0000 mg | Freq: Three times a day (TID) | INTRAVENOUS | Status: AC
  Administered 2019-07-24 – 2019-07-26 (×7): 500 mg via INTRAVENOUS
  Filled 2019-07-24 (×7): qty 100

## 2019-07-24 MED ORDER — SORBITOL 70 % SOLN
30.0000 mL | Status: DC | PRN
  Filled 2019-07-24: qty 30

## 2019-07-24 MED ORDER — RENA-VITE PO TABS
1.0000 | ORAL_TABLET | Freq: Every day | ORAL | Status: DC
  Administered 2019-07-24 – 2019-07-27 (×4): 1 via ORAL
  Filled 2019-07-24 (×5): qty 1

## 2019-07-24 MED ORDER — PIPERACILLIN-TAZOBACTAM IN DEX 2-0.25 GM/50ML IV SOLN
2.2500 g | Freq: Three times a day (TID) | INTRAVENOUS | Status: DC
  Filled 2019-07-24 (×2): qty 50

## 2019-07-24 MED ORDER — ONDANSETRON HCL 4 MG PO TABS: 4.0000 mg | ORAL_TABLET | Freq: Four times a day (QID) | ORAL | Status: DC | PRN

## 2019-07-24 MED ORDER — ZOLPIDEM TARTRATE 5 MG PO TABS
5.0000 mg | ORAL_TABLET | Freq: Every evening | ORAL | Status: DC | PRN
  Administered 2019-07-25 – 2019-07-27 (×3): 5 mg via ORAL
  Filled 2019-07-24 (×3): qty 1

## 2019-07-24 MED ORDER — ONDANSETRON HCL 4 MG/2ML IJ SOLN
4.0000 mg | Freq: Four times a day (QID) | INTRAMUSCULAR | Status: DC | PRN
  Administered 2019-07-28: 4 mg via INTRAVENOUS
  Filled 2019-07-24: qty 2

## 2019-07-24 MED ORDER — LINEZOLID 600 MG/300ML IV SOLN
600.0000 mg | Freq: Two times a day (BID) | INTRAVENOUS | Status: AC
  Administered 2019-07-24 – 2019-07-26 (×5): 600 mg via INTRAVENOUS
  Filled 2019-07-24 (×6): qty 300

## 2019-07-24 MED ORDER — HEPARIN SODIUM (PORCINE) 5000 UNIT/ML IJ SOLN
5000.0000 [IU] | Freq: Three times a day (TID) | INTRAMUSCULAR | Status: DC
  Administered 2019-07-24 – 2019-07-28 (×12): 5000 [IU] via SUBCUTANEOUS
  Filled 2019-07-24 (×12): qty 1

## 2019-07-24 MED ORDER — PANTOPRAZOLE SODIUM 40 MG PO TBEC
40.0000 mg | DELAYED_RELEASE_TABLET | Freq: Every day | ORAL | Status: DC
  Administered 2019-07-26 – 2019-07-28 (×3): 40 mg via ORAL
  Filled 2019-07-24 (×3): qty 1

## 2019-07-24 MED ORDER — CALCIUM CARBONATE ANTACID 1250 MG/5ML PO SUSP
500.0000 mg | Freq: Four times a day (QID) | ORAL | Status: DC | PRN
  Filled 2019-07-24: qty 5

## 2019-07-24 MED ORDER — ATORVASTATIN CALCIUM 40 MG PO TABS
40.0000 mg | ORAL_TABLET | Freq: Every day | ORAL | Status: DC
  Administered 2019-07-24 – 2019-07-28 (×5): 40 mg via ORAL
  Filled 2019-07-24 (×5): qty 1

## 2019-07-24 MED ORDER — CAMPHOR-MENTHOL 0.5-0.5 % EX LOTN
1.0000 "application " | TOPICAL_LOTION | Freq: Three times a day (TID) | CUTANEOUS | Status: DC | PRN
  Filled 2019-07-24: qty 222

## 2019-07-24 MED ORDER — ACETAMINOPHEN 650 MG RE SUPP: 650.0000 mg | Freq: Four times a day (QID) | RECTAL | Status: DC | PRN

## 2019-07-24 MED ORDER — INSULIN ASPART 100 UNIT/ML ~~LOC~~ SOLN: 0.0000 [IU] | Freq: Three times a day (TID) | SUBCUTANEOUS | Status: DC

## 2019-07-24 MED ORDER — OXYCODONE HCL 5 MG PO TABS
10.0000 mg | ORAL_TABLET | ORAL | Status: DC | PRN
  Administered 2019-07-25 – 2019-07-28 (×11): 10 mg via ORAL
  Filled 2019-07-24 (×11): qty 2

## 2019-07-24 MED ORDER — SODIUM CHLORIDE 0.9 % IV SOLN
2.0000 g | INTRAVENOUS | Status: DC
  Administered 2019-07-24 – 2019-07-26 (×3): 2 g via INTRAVENOUS
  Filled 2019-07-24 (×3): qty 20

## 2019-07-24 MED ORDER — DOCUSATE SODIUM 283 MG RE ENEM
1.0000 | ENEMA | RECTAL | Status: DC | PRN
  Filled 2019-07-24: qty 1

## 2019-07-24 NOTE — Consult Note (Signed)
Cardiology Consultation:   Patient ID: Jon Anderson MRN: [id number]; DOB: 11/03/1950  Admit date: 07/24/2019 Date of Consult: 07/24/2019  Primary Care Provider: [name], MD Primary Cardiologist: [name], MD  Primary Electrophysiologist:  None    Patient Profile:   Jon Anderson is a 69 y.o. male with a hx of anemia, nonobstructive CAD, HTN, ESRD on HD, carotid stenosis, HL, OSA, DMII, PAD s/p PTCA to L SFA who is being seen today for the evaluation of pre op evaluation at the request of Dr. Lorin Mercy.  History of Present Illness:   Jon Anderson is a 69 yo male with PMH noted above. He is followed by Dr. Gwenlyn Found as an outpatient. Had a remote heart cath that showed non critical CAD. Notes indicate he underwent off pump LIMA at [facility] in 10/18 for a high grade lesion that was discovered during a work up for renal transplant. Underwent cardiac cath with Dr. Ellyn Hack in 2019 that showed patent LIMA-LAD with otherwise non-critical disease and normal LV function.  He is followed by Dr. Milinda Pointer for calluses on his feet. He is wheelchair bound and unable to bear weight on his right foot. He has LE dopplers on 04/15/19 that showed no significant obstructive disease. Carotid dopplers in May 2020 showed severe left ICA stenosis. He was last seen in the office 3/21.  Presented to the ED 4/15 with ongoing foot pain and diabetic foot infection. Had an MRI on 4/13 with open wound at 5th metatarsal head with underlying abscess, osteo of the 4th and 5th metatarsal heads and neck, along with severe cellulitis. He has been followed through the wound center. Dr. Sharol Given was consulted with plans to evaluate patient with possible 4/5th ray amputation.   Labs on admission with stable electrolytes, Cr 7.20, Lactic Acid 1.4>>1.2, WBC 5.5, Hgb 12.3. 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   In talking with the patient, he has been very limited in his activity for the past 3 months 2/2 his foot. Does  not normally have any chest pain or shortness of breath with little activity he does do.    Past Medical History:  Diagnosis Date  . Anemia of chronic disease   . CAD (coronary artery disease)    a.  Myoview 4/11: EF 53%, no scar or ischemia   c. MV 2012 Nl perfusion, apical thinning.  No ischemia or scar.  EF 49%, appears greater by visual estimate.;  d.  Dob stress echo 12/13:  Negative Dob stress echo. There is no evidence of ischemia.  The LVF is normal. b. Normal cors 2016.  . Carotid stenosis    a. <29% RICA, >93% LICA by duplex 10/1694  . Chronic chest pain    occ  . ESRD (end stage renal disease) on dialysis ([place])    M-W-F  . GERD (gastroesophageal reflux disease)   . HNP (herniated nucleus pulposus), lumbar   . HTN (hypertension)    echo 3/10: EF 60%, LAE  . Hyperlipidemia   . Nephrolithiasis    "passed them all"  . Peripheral arterial disease ([place])    a. s/p PTCA to L SFA.  Marland Kitchen Pneumonia yrs ago  . Restless legs   . Sciatic leg pain   . Sleep apnea    no cpap, needs to reschedule appointment to set up aquiring cpap  . Snores    a. presumed OSA, pt has refused sleep eval in past.  . Type II diabetes mellitus ([place])    no  longer on medications, checks blood glucose at home  . Urinary frequency     Past Surgical History:  Procedure Laterality Date  . ANGIOPLASTY / STENTING FEMORAL Left 12/11/2013   dr berry  . AV FISTULA PLACEMENT Left 03/19/2014   Procedure: CREATION OF ARTERIOVENOUS (AV) FISTULA  LEFT UPPER ARM;  Surgeon: Mal Misty, MD;  Location: [place];  Service: Vascular;  Laterality: Left;  . BACK SURGERY  01/2018   screws placed   . CARDIAC CATHETERIZATION  2001 and 2010   . COLONOSCOPY W/ BIOPSIES AND POLYPECTOMY    . COLONOSCOPY WITH PROPOFOL N/A 08/01/2016   Procedure: COLONOSCOPY WITH PROPOFOL;  Surgeon: Carol Ada, MD;  Location: [facility];  Service: Endoscopy;  Laterality: N/A;  . CORONARY ARTERY BYPASS GRAFT  2019   [facility] x 1 bypass  .  ESOPHAGOGASTRODUODENOSCOPY (EGD) WITH PROPOFOL N/A 08/01/2016   Procedure: ESOPHAGOGASTRODUODENOSCOPY (EGD) WITH PROPOFOL;  Surgeon: Carol Ada, MD;  Location: [facility];  Service: Endoscopy;  Laterality: N/A;  . FOOT FRACTURE SURGERY Right    ligament repair  . FRACTURE SURGERY     left forearm  . GRAFT APPLICATION Right 10/16/5883   Procedure: FAT GRAFT APPLICATION;  Surgeon: Evelina Bucy, DPM;  Location: [place];  Service: Podiatry;  Laterality: Right;  . INGUINAL HERNIA REPAIR Left   . LEFT HEART CATH AND CORS/GRAFTS ANGIOGRAPHY N/A 04/27/2017   Procedure: LEFT HEART CATH AND CORS/GRAFTS ANGIOGRAPHY;  Surgeon: Leonie Man, MD;  Location: [place] CV LAB;  Service: Cardiovascular;  Laterality: N/A;  . LEFT HEART CATHETERIZATION WITH CORONARY ANGIOGRAM N/A 06/22/2014   Procedure: LEFT HEART CATHETERIZATION WITH CORONARY ANGIOGRAM;  Surgeon: Troy Sine, MD;  Location: [facility] CATH LAB;  Service: Cardiovascular;  Laterality: N/A;  . LOWER EXTREMITY ANGIOGRAM Left 12/11/2013   Procedure: LOWER EXTREMITY ANGIOGRAM;  Surgeon: Lorretta Harp, MD;  Location: [facility] CATH LAB;  Service: Cardiovascular;  Laterality: Left;  . LUMBAR LAMINECTOMY/DECOMPRESSION MICRODISCECTOMY Right 07/03/2017   Procedure: MICRODISCECTOMY LUMBAR FIVE - SACRAL ONE RIGHT;  Surgeon: Consuella Lose, MD;  Location: [place];  Service: Neurosurgery;  Laterality: Right;  . LUMBAR LAMINECTOMY/DECOMPRESSION MICRODISCECTOMY Right 10/19/2017   Procedure: MICRODISCECTOMY LUMBAR FIVE- SACRAL 1 ONE ;  Surgeon: Consuella Lose, MD;  Location: [place];  Service: Neurosurgery;  Laterality: Right;  . TONSILLECTOMY AND ADENOIDECTOMY    . WISDOM TOOTH EXTRACTION    . WOUND DEBRIDEMENT Right 05/13/2019   Procedure: DEBRIDEMENT WOUND;  Surgeon: Evelina Bucy, DPM;  Location: [facility];  Service: Podiatry;  Laterality: Right;     Home Medications:  Prior to Admission medications   Medication Sig  Start Date End Date Taking? Authorizing Provider  acetaminophen (TYLENOL) 500 MG tablet Take 1,000 mg by mouth every 4 (four) hours as needed for moderate pain or headache.    Yes [provider]  aspirin EC 81 MG tablet Take 1 tablet (81 mg total) by mouth daily. 05/28/12  Yes Weaver, Scott T, PA-C  atorvastatin (LIPITOR) 40 MG tablet Take 1 tablet (40 mg total) by mouth daily at 6 PM. 12/13/13  Yes Brett Canales, PA-C  calcium acetate (PHOSLO) 667 MG capsule Take 3 capsules (2,001 mg total) by mouth 3 (three) times daily with meals. Patient taking differently: Take 1,334 mg by mouth 2 (two) times daily.  10/09/15  Yes Short, Noah Delaine, MD  clopidogrel (PLAVIX) 75 MG tablet Take 75 mg by mouth daily. 07/29/18  Yes [provider]  clotrimazole (LOTRIMIN)  1 % cream Apply between toes and under toes twice daily for 4 weeks. Patient taking differently: Apply 1 application topically daily. Apply between toes and under toes twice daily for 4 weeks. 09/17/18  Yes Marzetta Board, DPM  collagenase (SANTYL) ointment Apply 1 application topically daily. Wound Measurement 1.0 x 1.0cm 06/06/19  Yes Evelina Bucy, DPM  gabapentin (NEURONTIN) 100 MG capsule Take 100 mg by mouth 2 (two) times daily.    Yes [provider]  multivitamin (RENA-VIT) TABS tablet Take 1 tablet by mouth at bedtime. 06/23/14  Yes Domenic Polite, MD  mupirocin ointment (BACTROBAN) 2 % Apply to right foot once daily. 04/22/19 04/21/20 Yes [place], Stephani Police, DPM  nitroGLYCERIN (NITROSTAT) 0.4 MG SL tablet Place 1 tablet (0.4 mg total) under the tongue every 5 (five) minutes as needed for chest pain. 03/20/12  Yes Barrett, Evelene Croon, PA-C  oxyCODONE (OXY IR/ROXICODONE) 5 MG immediate release tablet Take 2 tablets (10 mg total) by mouth every 4 (four) hours as needed for moderate pain. 06/20/19  Yes Evelina Bucy, DPM  pantoprazole (PROTONIX) 40 MG tablet Take 40 mg by mouth daily.   Yes [provider]    polyethylene glycol (MIRALAX / GLYCOLAX) packet Take 17 g by mouth daily as needed for moderate constipation. 03/22/17  Yes Aline August, MD  sucralfate (CARAFATE) 1 g tablet Take 1 g by mouth 2 (two) times daily. 05/26/19  Yes [provider]  traMADol (ULTRAM) 50 MG tablet Take 50 mg by mouth in the morning, at noon, and at bedtime.   Yes [provider]  [facility] ULTRA test strip  04/28/19   [provider]    Inpatient Medications: Scheduled Meds: . atorvastatin  40 mg Oral q1800  . calcium acetate  1,334 mg Oral BID  . gabapentin  100 mg Oral BID  . heparin  5,000 Units Subcutaneous Q8H  . insulin aspart  0-6 Units Subcutaneous TID WC  . multivitamin  1 tablet Oral QHS  . [START ON 07/25/2019] pantoprazole  40 mg Oral Daily  . sucralfate  1 g Oral BID  . traMADol  50 mg Oral TID   Continuous Infusions: . cefTRIAXone (ROCEPHIN)  IV    . linezolid (ZYVOX) IV 600 mg (07/24/19 1533)  . metronidazole     PRN Meds: acetaminophen **OR** acetaminophen, calcium carbonate (dosed in mg elemental calcium), camphor-menthol **AND** hydrOXYzine, docusate sodium, feeding supplement (NEPRO CARB STEADY), ondansetron **OR** ondansetron (ZOFRAN) IV, oxyCODONE, sorbitol, zolpidem  Allergies:    Allergies  Allergen Reactions  . Kiwi Extract Itching, Swelling and Other (See Comments)    Lips and face swell- breathing not affected  . Flexeril [Cyclobenzaprine]     Hands become flimsy, can not hold things  . Tape Other (See Comments)    "Plastic" tape causes blisters!!    Social History:   Social History   Socioeconomic History  . Marital status: Married    Spouse name: Not on file  . Number of children: Not on file  . Years of education: Not on file  . Highest education level: Not on file  Occupational History  . Occupation: retired  Tobacco Use  . Smoking status: Former Smoker    Packs/day: 1.00    Years: 2.00    Pack years: 2.00    Types: Cigarettes     Quit date: 1976    Years since quitting: 45.3  . Smokeless tobacco: Never Used  . Tobacco comment: quit smoking 40 yrs  ago  Substance and Sexual Activity  . Alcohol use: No    Alcohol/week: 0.0 standard drinks    Comment: h/o social drinking  . Drug use: Not Currently    Types: Marijuana  . Sexual activity: Yes    Birth control/protection: None  Other Topics Concern  . Not on file  Social History Narrative   The patient is a Retail buyer.  He is married and has 2 grown children.  Lives in [place] with his wife.  Denies tobacco, alcohol or IV drug abuse or marijuana or cocaine intake.    Social Determinants of Health   Financial Resource Strain:   . Difficulty of Paying Living Expenses:   Food Insecurity:   . Worried About Charity fundraiser in the Last Year:   . Arboriculturist in the Last Year:   Transportation Needs:   . Film/video editor (Medical):   Marland Kitchen Lack of Transportation (Non-Medical):   Physical Activity:   . Days of Exercise per Week:   . Minutes of Exercise per Session:   Stress:   . Feeling of Stress :   Social Connections:   . Frequency of Communication with Friends and Family:   . Frequency of Social Gatherings with Friends and Family:   . Attends Religious Services:   . Active Member of Clubs or Organizations:   . Attends Archivist Meetings:   Marland Kitchen Marital Status:   Intimate Partner Violence:   . Fear of Current or Ex-Partner:   . Emotionally Abused:   Marland Kitchen Physically Abused:   . Sexually Abused:     Family History:    Family History  Problem Relation Age of Onset  . Heart attack Sister        died @ 34  . Cancer Mother        died @ 75; unknown type  . Diabetes Brother        deceased  . Cirrhosis Father        alcohol related  . Diabetes Father   . Esophageal cancer Neg Hx   . Colon cancer Neg Hx   . Pancreatic cancer Neg Hx   . Stomach cancer Neg Hx      ROS:  Please see the history of present illness.   All other ROS reviewed and  negative.     Physical Exam/Data:   Vitals:   07/24/19 1032 07/24/19 1033  BP: 127/77   Pulse: 84   Resp: 14   Temp: 98.5 F (36.9 C)   TempSrc: Oral   SpO2: 100%   Weight:  71.2 kg  Height:  5\' 11"  (1.803 m)   No intake or output data in the 24 hours ending 07/24/19 1535 Last 3 Weights 07/24/2019 06/11/2019 05/13/2019  Weight (lbs) 157 lb 166 lb 166 lb 14.4 oz  Weight (kg) 71.215 kg 75.297 kg 75.705 kg     Body mass index is 21.9 kg/m.  General:  Well nourished, well developed, in no acute distress HEENT: normal Lymph: no adenopathy Neck: no JVD Endocrine:  No thryomegaly Vascular: No carotid bruits; FA pulses 2+ bilaterally without bruits  Cardiac:  normal S1, S2; RRR; no murmur  Lungs:  clear to auscultation bilaterally, no wheezing, rhonchi or rales  Abd: soft, nontender, no hepatomegaly  Ext: no edema Musculoskeletal:  No deformities, BUE and BLE strength normal and equal. 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 -Focal lesion that was previously described.  LIMA-LAD is widely patent and is normal in caliber. There is competitive flow.  Otherwise minimal disease throughout. Nothing made greater than 40% in the ostial circumflex.  The left ventricular systolic function is normal. The left ventricular ejection fraction is 50-55% by visual estimate.  LV end diastolic pressure is low. - ~0-3 mmHg  There is no aortic valve stenosis.   Angiographically no culprit lesion to explain the patient's symptoms.  He does have a significant LAD lesion which is a very focal lesion and easily stent pull, however there is a widely patent LIMA graft distally.  There is actually retrograde filling from the LIMA graft to the diagonal branch which  would be the only branch jeopardized by the more upstream LAD lesion. Nothing to explain the patient's symptoms.  In fact, his LVEDP is very low after dialysis.  This would indicate that he was adequately dialyzed and argue against increased LVEDP causing microvascular ischemic symptoms.   At this point, I think the only choice is to continue with his low-dose beta-blocker and nitrate for what still is probably microvascular disease disease. At least we know that with his ongoing symptoms are not likely related to any flow-limiting microvascular lesions.  Anticipate that he can be discharged tomorrow after his bedrest is over and medications have been reassessed.     Jon Anderson, M.D., M.S. Interventional Cardiologist   Diagnostic Dominance: Right    Echo: 11/19  Study Conclusions   - Left ventricle: The cavity size was normal. There was severe  concentric hypertrophy. Systolic function was normal. The  estimated ejection fraction was in the range of 60% to 65%. Wall  motion was normal; there were no regional wall motion  abnormalities. There was an increased relative contribution of  atrial contraction to ventricular filling. Doppler parameters are  consistent with abnormal left ventricular relaxation (grade 1  diastolic dysfunction). Doppler parameters are consistent with  high ventricular filling pressure.  - Aortic valve: Trileaflet; mildly thickened, mildly calcified  leaflets. Valve area (VTI): 1.99 cm^2. Valve area (Vmax): 2.12  cm^2. Valve area (Vmean): 2.02 cm^2.  - Mitral valve: Severely calcified annulus. Moderate diffuse  thickening and calcification of the anterior leaflet and  posterior leaflet. Mobility of the posterior leaflet was  restricted to the point of immobility. The findings are  consistent with moderate stenosis. There was mild regurgitation.  Mean gradient (D): 7 mm Hg. Valve area by continuity equation  (using  LVOT flow): 1.11 cm^2.  - Left atrium: The atrium was mildly dilated.  - Pulmonary arteries: Systolic pressure could not be accurately  estimated as IVC is not visualized.   Impressions:   - The right ventricular systolic pressure was increased consistent  with mild pulmonary hypertension.   Laboratory Data:  High Sensitivity Troponin:  No results for input(s): TROPONINIHS in the last 720 hours.   Chemistry Recent Labs  Lab 07/24/19 1042  NA 135  K 3.8  CL 91*  CO2 30  GLUCOSE 127*  BUN 22  CREATININE 7.20*  CALCIUM 9.2  GFRNONAA 7*  GFRAA 8*  ANIONGAP 14    Recent Labs  Lab 07/24/19 1042  PROT 7.8  ALBUMIN 3.7  AST 19  ALT 16  ALKPHOS 115  BILITOT 0.6  Hematology Recent Labs  Lab 07/24/19 1042  WBC 5.5  RBC 4.37  HGB 12.3*  HCT 40.2  MCV 92.0  MCH 28.1  MCHC 30.6  RDW 15.9*  PLT 259   BNPNo results for input(s): BNP, PROBNP in the last 168 hours.  DDimer No results for input(s): DDIMER in the last 168 hours.   Radiology/Studies:  MR FOOT RIGHT W WO CONTRAST  Result Date: 07/23/2019 CLINICAL DATA:  Diabetic foot ulcer involving the lateral aspect of the foot near the fifth metatarsal head. EXAM: MRI OF THE RIGHT FOREFOOT WITHOUT AND WITH CONTRAST TECHNIQUE: Multiplanar, multisequence MR imaging of the right foot was performed before and after the administration of intravenous contrast. CONTRAST:  70mL GADAVIST GADOBUTROL 1 MMOL/ML IV SOLN COMPARISON:  Radiographs 06/12/2019 FINDINGS: There is an open wound noted along the lateral aspect of the forefoot at the level of the fifth metatarsal head. There is an underlying rim enhancing abscess measuring 2.5 cm wrapping around the fifth metatarsal neck area. There is abnormal this signal intensity and enhancement in the fifth metatarsal head and neck and also in the proximal fifth phalanx consistent with osteomyelitis. Probable septic arthritis at the fifth MTP joint. There is also abnormal T2 signal  intensity and enhancement in the fourth metatarsal head suspicious for osteomyelitis. Severe diffuse cellulitis and myofasciitis without definite findings for pyomyositis. IMPRESSION: 1. Open wound along the lateral aspect of the forefoot at the level of the fifth metatarsal head with an underlying 2.5 cm abscess. 2. 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 There is also osteomyelitis involving the fourth metatarsal head. 3. Probable septic arthritis at the fifth MTP joint. 4. Severe cellulitis and myofasciitis without definite findings for pyomyositis. Electronically Signed   By: Marijo Sanes M.D.   On: 07/23/2019 10:03   Assessment and Plan:   CARLO GUEVARRA is a 69 y.o. male with a hx of anemia, nonobstructive CAD, HTN, ESRD on HD, carotid stenosis, HL, OSA, DMII, PAD s/p PTCA to L SFA who is being seen today for the evaluation of pre op evaluation at the request of Dr. Lorin Mercy.  1. Pre op evaluation: he does have known coronary disease with LIMA-LAD. Last cath in 2019 with patent LIMA and mild nonobstructive disease in the Lcx. He has not had an anginal symptoms prior to admission, but is limited in his activity. At this time would not anticipate further cardiac work up prior to surgery for osteo. -- ASA and plavix have been held on admission in anticipation of surgery   2. Diabetic foot infection: Dr. Sharol Given consulted with plans for possible ray amputation. -- IV antibiotics per primary  3. ESRD on HD: MWF. Does not appear to be volume overloaded. Nephrology consulted via primary  4. Chronic diastolic HF: Echo 1093 with normal EF and G1DD. Volume management per HD  5. HL: on statin  For questions or updates, please contact [place] Please consult [email or web address] for contact info under   Signed, Reino Bellis, NP  07/24/2019 3:35 PM

## 2019-07-24 NOTE — Consult Note (Signed)
ORTHOPAEDIC CONSULTATION  REQUESTING PHYSICIAN: Karmen Bongo, MD  Chief Complaint: Painful ulceration fifth metatarsal head right foot.  HPI: Jon Anderson is a 69 y.o. male who presents with 12-week history of painful ulcer fifth metatarsal head right foot.  Patient has diabetes with end-stage renal disease on dialysis Monday Wednesday Friday who states he has had this chronic ulcer he states that it should have been caught 4 weeks ago.  Past Medical History:  Diagnosis Date  . Anemia of chronic disease   . CAD (coronary artery disease)    a.  Myoview 4/11: EF 53%, no scar or ischemia   c. MV 2012 Nl perfusion, apical thinning.  No ischemia or scar.  EF 49%, appears greater by visual estimate.;  d.  Dob stress echo 12/13:  Negative Dob stress echo. There is no evidence of ischemia.  The LVF is normal. b. Normal cors 2016.  . Carotid stenosis    a. <81% RICA, >19% LICA by duplex 04/4780  . Chronic chest pain    occ  . ESRD (end stage renal disease) on dialysis ([place])    M-W-F  . GERD (gastroesophageal reflux disease)   . HNP (herniated nucleus pulposus), lumbar   . HTN (hypertension)    echo 3/10: EF 60%, LAE  . Hyperlipidemia   . Nephrolithiasis    "passed them all"  . Peripheral arterial disease ([place])    a. s/p PTCA to L SFA.  Marland Kitchen Pneumonia yrs ago  . Restless legs   . Sciatic leg pain   . Sleep apnea    no cpap, needs to reschedule appointment to set up aquiring cpap  . Snores    a. presumed OSA, pt has refused sleep eval in past.  . Type II diabetes mellitus ([place])    no longer on medications, checks blood glucose at home  . Urinary frequency    Past Surgical History:  Procedure Laterality Date  . ANGIOPLASTY / STENTING FEMORAL Left 12/11/2013   dr berry  . AV FISTULA PLACEMENT Left 03/19/2014   Procedure: CREATION OF ARTERIOVENOUS (AV) FISTULA  LEFT UPPER ARM;  Surgeon: Mal Misty, MD;  Location: [facility];  Service: Vascular;  Laterality: Left;  . BACK  SURGERY  01/2018   screws placed   . CARDIAC CATHETERIZATION  2001 and 2010   . COLONOSCOPY W/ BIOPSIES AND POLYPECTOMY    . COLONOSCOPY WITH PROPOFOL N/A 08/01/2016   Procedure: COLONOSCOPY WITH PROPOFOL;  Surgeon: Carol Ada, MD;  Location: [facility];  Service: Endoscopy;  Laterality: N/A;  . CORONARY ARTERY BYPASS GRAFT  2019   [facility] x 1 bypass  . ESOPHAGOGASTRODUODENOSCOPY (EGD) WITH PROPOFOL N/A 08/01/2016   Procedure: ESOPHAGOGASTRODUODENOSCOPY (EGD) WITH PROPOFOL;  Surgeon: Carol Ada, MD;  Location: [facility];  Service: Endoscopy;  Laterality: N/A;  . FOOT FRACTURE SURGERY Right    ligament repair  . FRACTURE SURGERY     left forearm  . GRAFT APPLICATION Right 12/14/6211   Procedure: FAT GRAFT APPLICATION;  Surgeon: Evelina Bucy, DPM;  Location: [facility];  Service: Podiatry;  Laterality: Right;  . INGUINAL HERNIA REPAIR Left   . LEFT HEART CATH AND CORS/GRAFTS ANGIOGRAPHY N/A 04/27/2017   Procedure: LEFT HEART CATH AND CORS/GRAFTS ANGIOGRAPHY;  Surgeon: Leonie Man, MD;  Location: [facility];  Service: Cardiovascular;  Laterality: N/A;  . LEFT HEART CATHETERIZATION WITH CORONARY ANGIOGRAM N/A 06/22/2014   Procedure: LEFT HEART CATHETERIZATION WITH CORONARY ANGIOGRAM;  Surgeon: Troy Sine,  MD;  Location: [facility];  Service: Cardiovascular;  Laterality: N/A;  . LOWER EXTREMITY ANGIOGRAM Left 12/11/2013   Procedure: LOWER EXTREMITY ANGIOGRAM;  Surgeon: Lorretta Harp, MD;  Location: [facility];  Service: Cardiovascular;  Laterality: Left;  . LUMBAR LAMINECTOMY/DECOMPRESSION MICRODISCECTOMY Right 07/03/2017   Procedure: MICRODISCECTOMY LUMBAR FIVE - SACRAL ONE RIGHT;  Surgeon: Consuella Lose, MD;  Location: [facility];  Service: Neurosurgery;  Laterality: Right;  . LUMBAR LAMINECTOMY/DECOMPRESSION MICRODISCECTOMY Right 10/19/2017   Procedure: MICRODISCECTOMY LUMBAR FIVE- SACRAL 1 ONE ;  Surgeon: Consuella Lose, MD;  Location: [facility];   Service: Neurosurgery;  Laterality: Right;  . TONSILLECTOMY AND ADENOIDECTOMY    . WISDOM TOOTH EXTRACTION    . WOUND DEBRIDEMENT Right 05/13/2019   Procedure: DEBRIDEMENT WOUND;  Surgeon: Evelina Bucy, DPM;  Location: [facility];  Service: Podiatry;  Laterality: Right;   Social History   Socioeconomic History  . Marital status: Married    Spouse name: Not on file  . Number of children: Not on file  . Years of education: Not on file  . Highest education level: Not on file  Occupational History  . Not on file  Tobacco Use  . Smoking status: Former Smoker    Packs/day: 1.00    Years: 2.00    Pack years: 2.00    Types: Cigarettes  . Smokeless tobacco: Never Used  . Tobacco comment: quit smoking 40 yrs ago  Substance and Sexual Activity  . Alcohol use: No    Alcohol/week: 0.0 standard drinks  . Drug use: No  . Sexual activity: Yes    Birth control/protection: None  Other Topics Concern  . Not on file  Social History Narrative   The patient is a Retail buyer.  He is married and has 2 grown children.  Lives in [place] with his wife.  Denies tobacco, alcohol or IV drug abuse or marijuana or cocaine intake.    Social Determinants of Health   Financial Resource Strain:   . Difficulty of Paying Living Expenses:   Food Insecurity:   . Worried About Charity fundraiser in the Last Year:   . Arboriculturist in the Last Year:   Transportation Needs:   . Film/video editor (Medical):   Marland Kitchen Lack of Transportation (Non-Medical):   Physical Activity:   . Days of Exercise per Week:   . Minutes of Exercise per Session:   Stress:   . Feeling of Stress :   Social Connections:   . Frequency of Communication with Friends and Family:   . Frequency of Social Gatherings with Friends and Family:   . Attends Religious Services:   . Active Member of Clubs or Organizations:   . Attends Archivist Meetings:   Marland Kitchen Marital Status:    Family History  Problem Relation Age of  Onset  . Heart attack Sister        died @ 83  . Cancer Mother        died @ 3; unknown type  . Diabetes Brother        deceased  . Cirrhosis Father        alcohol related  . Diabetes Father   . Esophageal cancer Neg Hx   . Colon cancer Neg Hx   . Pancreatic cancer Neg Hx   . Stomach cancer Neg Hx    - negative except otherwise stated in the family history section Allergies  Allergen Reactions  . Kiwi Extract  Itching, Swelling and Other (See Comments)    Lips and face swell- breathing not affected  . Flexeril [Cyclobenzaprine]     Hands become flimsy, can not hold things  . Other Other (See Comments)  . Tape Other (See Comments)    "Plastic" tape causes blisters!!   Prior to Admission medications   Medication Sig Start Date End Date Taking? Authorizing Provider  acetaminophen (TYLENOL) 500 MG tablet Take 1,000 mg by mouth every 4 (four) hours as needed for moderate pain or headache.     [provider]  aspirin EC 81 MG tablet Take 1 tablet (81 mg total) by mouth daily. 05/28/12   Richardson Dopp T, PA-C  atorvastatin (LIPITOR) 40 MG tablet Take 1 tablet (40 mg total) by mouth daily at 6 PM. 12/13/13   Brett Canales, PA-C  calcium acetate (PHOSLO) 667 MG capsule Take 3 capsules (2,001 mg total) by mouth 3 (three) times daily with meals. Patient taking differently: Take 1,334 mg by mouth 2 (two) times daily.  10/09/15   Janece Canterbury, MD  cinacalcet (SENSIPAR) 60 MG tablet Take 60 mg by mouth. Takes Monday, Wednesday, friday    [provider]  clopidogrel (PLAVIX) 75 MG tablet Take 75 mg by mouth daily. 07/29/18   [provider]  clotrimazole (LOTRIMIN) 1 % cream Apply between toes and under toes twice daily for 4 weeks. 09/17/18   Marzetta Board, DPM  collagenase (SANTYL) ointment Apply 1 application topically daily. Wound Measurement 1.0 x 1.0cm 06/06/19   Evelina Bucy, DPM  Etelcalcetide HCl (PARSABIV IV) Etelcalcetide Hermina Staggers) 04/04/19  04/02/20  [provider]  gabapentin (NEURONTIN) 100 MG capsule Take 100 mg by mouth 2 (two) times daily.     [provider]  loperamide (IMODIUM) 2 MG capsule Take 2 mg by mouth as needed for diarrhea or loose stools.    [provider]  multivitamin (RENA-VIT) TABS tablet Take 1 tablet by mouth at bedtime. 06/23/14   Domenic Polite, MD  mupirocin ointment (BACTROBAN) 2 % Apply to right foot once daily. 04/22/19 04/21/20  Marzetta Board, DPM  nitroGLYCERIN (NITROSTAT) 0.4 MG SL tablet Place 1 tablet (0.4 mg total) under the tongue every 5 (five) minutes as needed for chest pain. 03/20/12   Barrett, Evelene Croon, PA-C  ONETOUCH ULTRA test strip  04/28/19   [provider]  oxyCODONE (OXY IR/ROXICODONE) 5 MG immediate release tablet Take 2 tablets (10 mg total) by mouth every 4 (four) hours as needed for moderate pain. 06/20/19   Evelina Bucy, DPM  pantoprazole (PROTONIX) 40 MG tablet Take 40 mg by mouth daily.    [provider]  polyethylene glycol (MIRALAX / GLYCOLAX) packet Take 17 g by mouth daily as needed for moderate constipation. 03/22/17   Aline August, MD  sucralfate (CARAFATE) 1 g tablet Take 1 g by mouth 2 (two) times daily. 05/26/19   [provider]   MR FOOT RIGHT W WO CONTRAST  Result Date: 07/23/2019 CLINICAL DATA:  Diabetic foot ulcer involving the lateral aspect of the foot near the fifth metatarsal head. EXAM: MRI OF THE RIGHT FOREFOOT WITHOUT AND WITH CONTRAST TECHNIQUE: Multiplanar, multisequence MR imaging of the right foot was performed before and after the administration of intravenous contrast. CONTRAST:  61mL GADAVIST GADOBUTROL 1 MMOL/ML IV SOLN COMPARISON:  Radiographs 06/12/2019 FINDINGS: There is an open wound noted along the lateral aspect of the forefoot at the level of the fifth metatarsal head. There is  an underlying rim enhancing abscess measuring 2.5 cm wrapping around the fifth metatarsal neck area. There is  abnormal this signal intensity and enhancement in the fifth metatarsal head and neck and also in the proximal fifth phalanx consistent with osteomyelitis. Probable septic arthritis at the fifth MTP joint. There is also abnormal T2 signal intensity and enhancement in the fourth metatarsal head suspicious for osteomyelitis. Severe diffuse cellulitis and myofasciitis without definite findings for pyomyositis. IMPRESSION: 1. Open wound along the lateral aspect of the forefoot at the level of the fifth metatarsal head with an underlying 2.5 cm abscess. 2. 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 There is also osteomyelitis involving the fourth metatarsal head. 3. Probable septic arthritis at the fifth MTP joint. 4. Severe cellulitis and myofasciitis without definite findings for pyomyositis. Electronically Signed   By: Marijo Sanes M.D.   On: 07/23/2019 10:03   - pertinent xrays, CT, MRI studies were reviewed and independently interpreted  Positive ROS: All other systems have been reviewed and were otherwise negative with the exception of those mentioned in the HPI and as above.  Physical Exam: General: Alert, no acute distress Psychiatric: Patient is competent for consent with normal mood and affect Lymphatic: No axillary or cervical lymphadenopathy Cardiovascular: No pedal edema Respiratory: No cyanosis, no use of accessory musculature GI: No organomegaly, abdomen is soft and non-tender    Images:  @ENCIMAGES @  Labs:  Lab Results  Component Value Date   HGBA1C 6.5 (H) 07/18/2018   HGBA1C 6.4 (H) 10/16/2017   HGBA1C 7.3 (H) 06/21/2017   REPTSTATUS 04/28/2018 FINAL 04/23/2018   CULT  04/23/2018    NO GROWTH 5 DAYS Performed at [facility], [place] [address], [place], [place] [address]     Lab Results  Component Value Date   ALBUMIN 3.7 07/24/2019   ALBUMIN 3.2 (L) 04/24/2019   ALBUMIN 3.7 11/04/2018    Neurologic: Patient does not  have protective sensation bilateral lower extremities.   MUSCULOSKELETAL:   Skin: Examination there is an ischemic ulcer over the fifth metatarsal this probes down to bone.  Patient does not have palpable pulses but by Doppler patient has monophasic to biphasic pulses.  Review of the MRI scan shows osteomyelitis of the fifth metatarsal.  Radiographs show calcification of the arteries into the foot.  Assessment: Assessment: Diabetic insensate neuropathy end-stage renal disease on dialysis with osteomyelitis and ulceration over the fifth metatarsal head right foot.  Plan: Plan: We will plan for right foot fifth ray amputation we will try to do this before his dialysis today.  Discussed with the patient with his peripheral vascular disease he has an increased risk of delayed healing possible nonhealing and wound dehiscence with the possibility of a transtibial amputation.  Patient states he understands wished to proceed with surgery as soon as possible.  Thank you for the consult and the opportunity to see Mr. Gerrett Loman, [place] [phone] 1:21 PM

## 2019-07-24 NOTE — ED Triage Notes (Addendum)
Pt sent here from wound center for further evaluation of diabetic wound to right foot, states it has been there since January and has gotten worse. MRI done 2 days ago showing osteomyelitis. Pt states he is not currently on any medications for his diabetes. Foot wrapped with gauze and ortho boot in triage, unable to assess at this time but pt reports some drainage.  Pt also receives dialysis MWF, last session was yesterday.

## 2019-07-24 NOTE — H&P (Addendum)
History and Physical    Jon Anderson PIR:[id number] DOB: [birth year] DOA: 07/24/2019  PCP: [name], MD Consultants:  Gwenlyn Found - cardiology; Colodonato - nephrology; March Rummage - podiatry; Dellia Nims - wound care Patient coming from:  Home - lives with wife and daughter; NOK: Jon Anderson, [id number]  Chief Complaint: Diabetic foot infection  HPI: Jon Anderson is a 69 y.o. male with medical history significant of DM, not on medication; OSA; PAD s/p stent; HLD; HTN; ESRD on MWF HD; and CAD s/p CABG presenting with diabetic foot infection.  4/13 MRI with open wound at 5th metatarsal head with underlying abscess; osteo of the 4th and 5th metatarsal heads and neck and proximal 5th phalanx; and severe cellulitis/myofasciitis without obvious pyomyositis.  January 16, he had a callous on his foot; it was so deep when they cut it out that he was "walking on bone" and they recommended collagen injection (done on 2/7 by Dr. March Rummage).  It didn't heal; 2 weeks later he went back and the infection was cleaned out and it still never healed.  He went to the [facility].  MRI was ordered and done on 4/13.  They recommended that he come over and have [place] look at it.  They said the infection was to the bone and he might lose that little toe.  No fevers.  He is not feeling sick other than the foot.  He does have some constipation.    ED Course:  Persistent issues since Feb.  MRI yesterday with osteo.  Last HD yesterday.  Needs IV antibiotics.  Review of Systems: As per HPI; otherwise review of systems reviewed and negative.   Ambulatory Status:  Ambulated without assistance prior to this, using a cane for the last 10 weeks  COVID Vaccine Status:  Complete  Past Medical History:  Diagnosis Date  . Anemia of chronic disease   . CAD (coronary artery disease)    a.  Myoview 4/11: EF 53%, no scar or ischemia   c. MV 2012 Nl perfusion, apical thinning.  No ischemia or scar.  EF 49%, appears greater  by visual estimate.;  d.  Dob stress echo 12/13:  Negative Dob stress echo. There is no evidence of ischemia.  The LVF is normal. b. Normal cors 2016.  . Carotid stenosis    a. <23% RICA, >55% LICA by duplex 10/3218  . Chronic chest pain    occ  . ESRD (end stage renal disease) on dialysis ([place])    M-W-F  . GERD (gastroesophageal reflux disease)   . HNP (herniated nucleus pulposus), lumbar   . HTN (hypertension)    echo 3/10: EF 60%, LAE  . Hyperlipidemia   . Nephrolithiasis    "passed them all"  . Peripheral arterial disease ([place])    a. s/p PTCA to L SFA.  Marland Kitchen Pneumonia yrs ago  . Restless legs   . Sciatic leg pain   . Sleep apnea    no cpap, needs to reschedule appointment to set up aquiring cpap  . Snores    a. presumed OSA, pt has refused sleep eval in past.  . Type II diabetes mellitus ([place])    no longer on medications, checks blood glucose at home  . Urinary frequency     Past Surgical History:  Procedure Laterality Date  . ANGIOPLASTY / STENTING FEMORAL Left 12/11/2013   dr berry  . AV FISTULA PLACEMENT Left 03/19/2014   Procedure: CREATION OF ARTERIOVENOUS (AV) FISTULA  LEFT UPPER  ARM;  Surgeon: Mal Misty, MD;  Location: [facility];  Service: Vascular;  Laterality: Left;  . BACK SURGERY  01/2018   screws placed   . CARDIAC CATHETERIZATION  2001 and 2010   . COLONOSCOPY W/ BIOPSIES AND POLYPECTOMY    . COLONOSCOPY WITH PROPOFOL N/A 08/01/2016   Procedure: COLONOSCOPY WITH PROPOFOL;  Surgeon: Carol Ada, MD;  Location: [facility];  Service: Endoscopy;  Laterality: N/A;  . CORONARY ARTERY BYPASS GRAFT  2019   [facility] x [facility] bypass  . ESOPHAGOGASTRODUODENOSCOPY (EGD) WITH PROPOFOL N/A 08/01/2016   Procedure: ESOPHAGOGASTRODUODENOSCOPY (EGD) WITH PROPOFOL;  Surgeon: Carol Ada, MD;  Location: [facility];  Service: Endoscopy;  Laterality: N/A;  . FOOT FRACTURE SURGERY Right    ligament repair  . FRACTURE SURGERY     left forearm  . GRAFT APPLICATION Right 04/10/9145    Procedure: FAT GRAFT APPLICATION;  Surgeon: Evelina Bucy, DPM;  Location: [facility];  Service: Podiatry;  Laterality: Right;  . INGUINAL HERNIA REPAIR Left   . LEFT HEART CATH AND CORS/GRAFTS ANGIOGRAPHY N/A 04/27/2017   Procedure: LEFT HEART CATH AND CORS/GRAFTS ANGIOGRAPHY;  Surgeon: Leonie Man, MD;  Location: [facility];  Service: Cardiovascular;  Laterality: N/A;  . LEFT HEART CATHETERIZATION WITH CORONARY ANGIOGRAM N/A 06/22/2014   Procedure: LEFT HEART CATHETERIZATION WITH CORONARY ANGIOGRAM;  Surgeon: Troy Sine, MD;  Location: [facility] CATH LAB;  Service: Cardiovascular;  Laterality: N/A;  . LOWER EXTREMITY ANGIOGRAM Left 12/11/2013   Procedure: LOWER EXTREMITY ANGIOGRAM;  Surgeon: Lorretta Harp, MD;  [facility];  Service: Cardiovascular;  Laterality: Left;  . LUMBAR LAMINECTOMY/DECOMPRESSION MICRODISCECTOMY Right 07/03/2017   Procedure: MICRODISCECTOMY LUMBAR FIVE - SACRAL ONE RIGHT;  Surgeon: Consuella Lose, MD;  Location: [facility];  Service: Neurosurgery;  Laterality: Right;  . LUMBAR LAMINECTOMY/DECOMPRESSION MICRODISCECTOMY Right 10/19/2017   Procedure: MICRODISCECTOMY LUMBAR FIVE- SACRAL 1 ONE ;  Surgeon: Consuella Lose, MD;  Location: [facility];  Service: Neurosurgery;  Laterality: Right;  . TONSILLECTOMY AND ADENOIDECTOMY    . WISDOM TOOTH EXTRACTION    . WOUND DEBRIDEMENT Right 05/13/2019   Procedure: DEBRIDEMENT WOUND;  Surgeon: Evelina Bucy, DPM;  Location: [facility];  Service: Podiatry;  Laterality: Right;    Social History   Socioeconomic History  . Marital status: Married    Spouse name: Not on file  . Number of children: Not on file  . Years of education: Not on file  . Highest education level: Not on file  Occupational History  . Occupation: retired  Tobacco Use  . Smoking status: Former Smoker    Packs/day: 1.00    Years: 2.00    Pack years: 2.00    Types: Cigarettes    Quit date: 1976    Years  since quitting: 45.3  . Smokeless tobacco: Never Used  . Tobacco comment: quit smoking 40 yrs ago  Substance and Sexual Activity  . Alcohol use: No    Alcohol/week: 0.0 standard drinks    Comment: h/o social drinking  . Drug use: Not Currently    Types: Marijuana  . Sexual activity: Yes    Birth control/protection: None  Other Topics Concern  . Not on file  Social History Narrative   The patient is a Retail buyer.  He is married and has 2 grown children.  Lives in [place] with his wife.  Denies tobacco, alcohol or IV drug abuse or marijuana or cocaine intake.    Social Determinants of  Health   Financial Resource Strain:   . Difficulty of Paying Living Expenses:   Food Insecurity:   . Worried About Charity fundraiser in the Last Year:   . Arboriculturist in the Last Year:   Transportation Needs:   . Film/video editor (Medical):   Marland Kitchen Lack of Transportation (Non-Medical):   Physical Activity:   . Days of Exercise per Week:   . Minutes of Exercise per Session:   Stress:   . Feeling of Stress :   Social Connections:   . Frequency of Communication with Friends and Family:   . Frequency of Social Gatherings with Friends and Family:   . Attends Religious Services:   . Active Member of Clubs or Organizations:   . Attends Archivist Meetings:   Marland Kitchen Marital Status:   Intimate Partner Violence:   . Fear of Current or Ex-Partner:   . Emotionally Abused:   Marland Kitchen Physically Abused:   . Sexually Abused:     Allergies  Allergen Reactions  . Kiwi Extract Itching, Swelling and Other (See Comments)    Lips and face swell- breathing not affected  . Flexeril [Cyclobenzaprine]     Hands become flimsy, can not hold things  . Tape Other (See Comments)    "Plastic" tape causes blisters!!    Family History  Problem Relation Age of Onset  . Heart attack Sister        died @ 51  . Cancer Mother        died @ 73; unknown type  . Diabetes Brother        deceased  . Cirrhosis Father          alcohol related  . Diabetes Father   . Esophageal cancer Neg Hx   . Colon cancer Neg Hx   . Pancreatic cancer Neg Hx   . Stomach cancer Neg Hx     Prior to Admission medications   Medication Sig Start Date End Date Taking? Authorizing Provider  acetaminophen (TYLENOL) 500 MG tablet Take 1,000 mg by mouth every 4 (four) hours as needed for moderate pain or headache.     [provider]  aspirin EC 81 MG tablet Take 1 tablet (81 mg total) by mouth daily. 05/28/12   Richardson Dopp T, PA-C  atorvastatin (LIPITOR) 40 MG tablet Take 1 tablet (40 mg total) by mouth daily at 6 PM. 12/13/13   Brett Canales, PA-C  calcium acetate (PHOSLO) 667 MG capsule Take 3 capsules (2,001 mg total) by mouth 3 (three) times daily with meals. Patient taking differently: Take 1,334 mg by mouth 2 (two) times daily.  10/09/15   Janece Canterbury, MD  cinacalcet (SENSIPAR) 60 MG tablet Take 60 mg by mouth. Takes Monday, Wednesday, friday    [provider]  clopidogrel (PLAVIX) 75 MG tablet Take 75 mg by mouth daily. 07/29/18   [provider]  clotrimazole (LOTRIMIN) 1 % cream Apply between toes and under toes twice daily for 4 weeks. 09/17/18   Marzetta Board, DPM  collagenase (SANTYL) ointment Apply 1 application topically daily. Wound Measurement 1.0 x 1.0cm 06/06/19   Evelina Bucy, DPM  Etelcalcetide HCl (PARSABIV IV) Etelcalcetide Hermina Staggers) 04/04/19 04/02/20  [provider]  gabapentin (NEURONTIN) 100 MG capsule Take 100 mg by mouth 2 (two) times daily.     [provider]  loperamide (IMODIUM) 2 MG capsule Take 2 mg by mouth as needed for diarrhea or loose stools.  [provider]  multivitamin (RENA-VIT) TABS tablet Take 1 tablet by mouth at bedtime. 06/23/14   Domenic Polite, MD  mupirocin ointment (BACTROBAN) 2 % Apply to right foot once daily. 04/22/19 04/21/20  Marzetta Board, DPM  nitroGLYCERIN (NITROSTAT) 0.4 MG SL tablet Place 1 tablet (0.4  mg total) under the tongue every 5 (five) minutes as needed for chest pain. 03/20/12   Barrett, Evelene Croon, PA-C  ONETOUCH ULTRA test strip  04/28/19   [provider]  oxyCODONE (OXY IR/ROXICODONE) 5 MG immediate release tablet Take 2 tablets (10 mg total) by mouth every 4 (four) hours as needed for moderate pain. 06/20/19   Evelina Bucy, DPM  pantoprazole (PROTONIX) 40 MG tablet Take 40 mg by mouth daily.    [provider]  polyethylene glycol (MIRALAX / GLYCOLAX) packet Take 17 g by mouth daily as needed for moderate constipation. 03/22/17   Aline August, MD  sucralfate (CARAFATE) 1 g tablet Take 1 g by mouth 2 (two) times daily. 05/26/19   [provider]    Physical Exam: Vitals:   07/24/19 1032 07/24/19 1033  BP: 127/77   Pulse: 84   Resp: 14   Temp: 98.5 F (36.9 C)   TempSrc: Oral   SpO2: 100%   Weight:  71.2 kg  Height:  '5\' 11"'  (1.803 m)     . General:  Appears calm and comfortable and is NAD . Eyes:  PERRL, EOMI, normal lids, iris . ENT:  grossly normal hearing, lips & tongue, mmm; suboptimal artificial dentition . Neck:  no LAD, masses or thyromegaly . Cardiovascular:  RRR, no m/r/g.  . Respiratory:   CTA bilaterally with no wheezes/rales/rhonchi.  Normal respiratory effort. . Abdomen:  soft, NT, ND, NABS . Skin:  Subtle RLE erythema with R lateral foot plantar ulceration        . Musculoskeletal:  grossly normal tone BUE/BLE, good ROM, no bony abnormality, RLE pedal edema . Psychiatric:  grossly normal mood and affect, speech fluent and appropriate, AOx3 . Neurologic:  CN 2-12 grossly intact, moves all extremities in coordinated fashion    Radiological Exams on Admission: MR FOOT RIGHT W WO CONTRAST  Result Date: 07/23/2019 CLINICAL DATA:  Diabetic foot ulcer involving the lateral aspect of the foot near the fifth metatarsal head. EXAM: MRI OF THE RIGHT FOREFOOT WITHOUT AND WITH CONTRAST TECHNIQUE: Multiplanar, multisequence MR  imaging of the right foot was performed before and after the administration of intravenous contrast. CONTRAST:  16m GADAVIST GADOBUTROL 1 MMOL/ML IV SOLN COMPARISON:  Radiographs 06/12/2019 FINDINGS: There is an open wound noted along the lateral aspect of the forefoot at the level of the fifth metatarsal head. There is an underlying rim enhancing abscess measuring 2.5 cm wrapping around the fifth metatarsal neck area. There is abnormal this signal intensity and enhancement in the fifth metatarsal head and neck and also in the proximal fifth phalanx consistent with osteomyelitis. Probable septic arthritis at the fifth MTP joint. There is also abnormal T2 signal intensity and enhancement in the fourth metatarsal head suspicious for osteomyelitis. Severe diffuse cellulitis and myofasciitis without definite findings for pyomyositis. IMPRESSION: 1. Open wound along the lateral aspect of the forefoot at the level of the fifth metatarsal head with an underlying 2.5 cm abscess. 2. 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 There is also osteomyelitis involving the fourth metatarsal head. 3. Probable septic arthritis at the fifth MTP joint. 4. Severe cellulitis and myofasciitis  without definite findings for pyomyositis. Electronically Signed   By: Marijo Sanes M.D.   On: 07/23/2019 10:03    EKG: Independently reviewed.  NSR with rate 71; nonspecific ST changes with no evidence of acute ischemia   Labs on Admission: I have personally reviewed the available labs and imaging studies at the time of the admission.  Pertinent labs:   Glucose 127 BUN 22/Creatinine 7.20/GFR 8 Lactate 1.4 Unremarkable CBC INR 1.0 Blood cultures pending   Assessment/Plan Principal Problem:   Diabetic foot infection ([place]) Active Problems:   Hyperlipidemia LDL goal <70   Essential hypertension   PVD (peripheral vascular disease) (HCC)   ESRD (end stage renal disease) on dialysis  (HCC)   Type 2 diabetes mellitus with diabetic peripheral angiopathy without gangrene (HCC)   Chronic diastolic CHF (congestive heart failure) (HCC)   Diabetic foot infection -Patient has been struggling with R lateral foot ulcer since January -MRI yesterday with osteo of the 4th and 5th metatarsal heads (and other findings) -He appears to need definitive treatment with 4th/5th ray amputation -Will admit to med surg -Dr. Sharol Given has agreed to see the patient and requests that he be NPO after midnight for possible surgery tomorrow -Negative lactate, no current concerns for sepsis -Will treat with IV antibiotics (Rocephin/Flagyl/Vanc - [employer] formulary substitution - as per the diabetic foot ulcer algorithm -Patient has h/o PVD with prior stent placement and so I have ordered ABIs in case revascularization may be indicated; however, I was notified that recent ABI showed noncompressible vessels and he does appear to have pedal pulses at this time - so will call to discuss with Dr. Gwenlyn Found -LE wound order set utilized including labs (CRP, ESR, A1c, prealbumin, HIV, and blood cultures) and consults (TOC team; diabetes coordinator; peripheral vascular navigator; wound care; and nutrition) -Hold ASA and Plavix in anticipation of surgery and resume when appropriate post-operatively  DM -Prior A1c in 4/20 was 6.5; will repeat -He does not appear to be taking medications for this issue at this time -Will order very sensitive-scale SSI  HLD -Continue Lipitor  HTN -He does not appear to be taking medications for this issue at this time  ESRD on HD -Patient on chronic MWF HD -Nephrology prn order set utilized -He does not appear to be volume overloaded or otherwise in need of acute HD -Dr. Justin Mend has been notified that patient will need HD tomorrow -Continue Phoslo, RenaVit  Chronic diastolic CHF -Echo in 26/2035 with preserved EF and grade 1 diastolic dysfunction -Volume management with HD -Appears  to be compensated at this time  Chronic pain -Continue Neurontin, Ultram, Oxycodone -I have reviewed this patient in the [place] Controlled Substances Reporting System.  He is receiving medications from only one provider and appears to be taking them as prescribed. -He is not at particularly high risk of opioid misuse, diversion, or overdose.   Note: This patient has been tested and is pending for the novel coronavirus COVID-19.  DVT prophylaxis:  SCDs Code Status:  Full - confirmed with patient Family Communication: None present; I spoke with the patient's wife by telephone at the time of admission. Disposition Plan:  The patient is from: home  Anticipated d/c is to: home without [facility] services once his orthopedics issues have been resolved.  Anticipated d/c date will depend on clinical response to treatment  Patient is currently: acutely ill Consults called: Orthopedics; Dr. Gwenlyn Found; [facility] team; diabetes coordinator; peripheral vascular navigator; wound care; and nutrition Admission status:  Admit -  It is my clinical opinion that admission to INPATIENT is reasonable and necessary because of the expectation that this patient will require hospital care that crosses at least 2 midnights to treat this condition based on the medical complexity of the problems presented.  Given the aforementioned information, the predictability of an adverse outcome is felt to be significant.    Karmen Bongo MD [facility]   How to contact the [facility] Attending or Consulting provider [place] or covering provider during after hours [place], for this patient?  1. Check the care team in [facility] and look for a) attending/consulting TRH provider listed and b) the [facility] team listed 2. Log into [email or web address] and use [place]'s universal password to access. If you do not have the password, please contact the hospital operator. 3. Locate the [facility] provider you are looking for under [facility] and page to a number that you  can be directly reached. 4. If you still have difficulty reaching the provider, please page the [facility] (Director on Call) for the Hospitalists listed on amion for assistance.   07/24/2019, 3:17 PM

## 2019-07-24 NOTE — Consult Note (Signed)
WOC Nurse Consult Note: Patient receiving care in [facility] ED 6.  Consult completed remotely after review of record and photographs. Reason for Consult: Chronic, non-healing diabetic foot ulcer. Patient followed by Podiatry and [facility]. Wound type: full thickness Pressure Injury POA: Yes/No/NA Measurement: deferred Wound bed: see photos Drainage (amount, consistency, odor)  Periwound: edematous, discolored darker than surrounding tissue.   Dressing procedure/placement/frequency:  I have had a Secure Chat with Dr. Thomasenia Bottoms.  I understand Dr. Sharol Given is involved with this case.  A topical dressing will not solve this patient's foot wound issue.  For now, place a foam dressing and change prn soilage. Thank you for the consult.  [place] nurse will not follow at this time.  Please re-consult the [facility] team if needed.  Val Riles, RN, MSN, CWOCN, CNS-BC, pager [phone]

## 2019-07-24 NOTE — Progress Notes (Signed)
Pharmacy Antibiotic Note  CASE Jon Anderson is a 69 y.o. male admitted on 07/24/2019 with wound infection.  Pharmacy has been consulted for Vanc and Zosyn dosing. Patient with ESRD.  Vanc assays are on backorder will switch from Vancomycin to Linezolid therapy.  Plan: Zosyn 2.25 gms IV q8hr Linezolid 600 mg IV q12hr Monitor dialysis schedule, clinical status and C&S.  Height: 5\' 11"  (180.3 cm) Weight: 71.2 kg (157 lb) IBW/kg (Calculated) : 75.3  Temp (24hrs), Avg:98.5 F (36.9 C), Min:98.5 F (36.9 C), Max:98.5 F (36.9 C)  Recent Labs  Lab 07/24/19 1042 07/24/19 1043  WBC 5.5  --   CREATININE 7.20*  --   LATICACIDVEN  --  1.4    Estimated Creatinine Clearance: 9.8 mL/min (A) (by C-G formula based on SCr of 7.2 mg/dL (H)).    Allergies  Allergen Reactions  . Kiwi Extract Itching, Swelling and Other (See Comments)    Lips and face swell- breathing not affected  . Flexeril [Cyclobenzaprine]     Hands become flimsy, can not hold things  . Other Other (See Comments)  . Tape Other (See Comments)    "Plastic" tape causes blisters!!    Antimicrobials this admission: Zosyn 4/15 >>  Linezolid 4/15 >>   Thank you for allowing pharmacy to be a part of this patient's care.  Alanda Slim, PharmD, [facility] Clinical Pharmacist Please see AMION for all Pharmacists' Contact Phone Numbers 07/24/2019, 1:02 PM

## 2019-07-24 NOTE — ED Provider Notes (Signed)
[facility] EMERGENCY DEPARTMENT Provider Note   CSN: [id number] Arrival date & time: 07/24/19  1028     History Chief Complaint  Patient presents with  . Wound Infection    Jon Anderson is a 69 y.o. male with a past medical history of ESRD on dialysis, hypertension, diabetes, who is status post debridement and fat graft injection at site of callous on bottom of R foot near 5th digit in Feb 2021, presenting to the ED with concerns for wound infection and osteomyelitis.  Patient has been going to the wound care center for the past 2 weeks since.  They noted worsening pain, drainage from the area and he underwent an MRI yesterday which showed osteomyelitis, septic arthritis and abscess near the fifth digit of the right foot.  He reports pain is worse with movement, palpation and ambulation.  He was told to come to the ER for admission, IV antibiotics. He denies any fevers, chills, injuries, shortness of breath, chest pain. Patient was last dialysis session yesterday.  Denies any missed sessions.  HPI     Past Medical History:  Diagnosis Date  . Anemia of chronic disease   . CAD (coronary artery disease)    a.  Myoview 4/11: EF 53%, no scar or ischemia   c. MV 2012 Nl perfusion, apical thinning.  No ischemia or scar.  EF 49%, appears greater by visual estimate.;  d.  Dob stress echo 12/13:  Negative Dob stress echo. There is no evidence of ischemia.  The LVF is normal. b. Normal cors 2016.  . Carotid stenosis    a. <40% RICA, >98% LICA by duplex 04/1912  . Chronic chest pain    occ  . ESRD (end stage renal disease) on dialysis ([place])    M-W-F  . GERD (gastroesophageal reflux disease)   . HNP (herniated nucleus pulposus), lumbar   . HTN (hypertension)    echo 3/10: EF 60%, LAE  . Hyperlipidemia   . Nephrolithiasis    "passed them all"  . Peripheral arterial disease ([place])    a. s/p PTCA to L SFA.  Marland Kitchen Pneumonia yrs ago  . Restless legs   . Sciatic leg pain     . Sleep apnea    no cpap, needs to reschedule appointment to set up aquiring cpap  . Snores    a. presumed OSA, pt has refused sleep eval in past.  . Type II diabetes mellitus ([place])    no longer on medications, checks blood glucose at home  . Urinary frequency     Patient Active Problem List   Diagnosis Date Noted  . Diabetic ulcer of right midfoot associated with diabetes mellitus due to underlying condition, with fat layer exposed ([place])   . Gastroenteritis 04/24/2019  . Allergy, unspecified, initial encounter 01/27/2019  . DM neuropathy with neurologic complication ([place]) 78/29/5621  . GERD (gastroesophageal reflux disease) 02/28/2018  . Nephrolithiasis 02/28/2018  . Sciatic leg pain 02/28/2018  . Snores 02/28/2018  . Orthopnea 02/23/2018  . Elevated troponin 02/23/2018  . HNP (herniated nucleus pulposus), lumbar 07/03/2017  . Encounter for removal of sutures 06/01/2017  . Chest pain in adult 04/27/2017  . Restless leg syndrome, uncontrolled 04/27/2017  . 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 Anemia of chronic disease 03/17/2017  . ESRD (end stage renal disease) on dialysis ([place]) 03/17/2017  . Chronic chest pain 03/17/2017  . Type II diabetes mellitus ([place]) 03/17/2017  . Acute on chronic diastolic heart failure (  [place]) 03/17/2017  . Acute heart failure ([place]) 03/17/2017  . Lumbar radiculopathy 01/16/2017  . Pre-transplant evaluation for kidney transplant 06/15/2016  . Pain, unspecified 04/17/2016  . Increased frequency of urination 12/17/2015  . Nocturia 12/17/2015  . Chest pain, non-cardiac 10/09/2015  . Hypercalcemia 05/10/2015  . Other fluid overload 02/24/2015  . 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 Fatty (change of) liver, not elsewhere classified 07/02/2014  . Aftercare including intermittent dialysis ([place]) 06/24/2014  . Diarrhea, unspecified 06/24/2014  . Fever, unspecified 06/24/2014  . Iron deficiency anemia,  unspecified 06/24/2014  . Other specified coagulation defects ([place]) 06/24/2014  . Pruritus, unspecified 06/24/2014  . Secondary hyperparathyroidism of renal origin ([place]) 06/24/2014  . Type 2 diabetes mellitus with diabetic peripheral angiopathy without gangrene ([place]) 06/24/2014  . Acute on chronic renal failure ([place]) 06/16/2014  . Shoulder pain, left 12/15/2013  . Chest pain 12/15/2013  . Claudication ([employer]) 12/11/2013  . PVD (peripheral vascular disease) ([place]) 12/11/2013  . Carotid artery disease ([place]) 09/30/2013  . Acute chest pain 11/15/2012  . Bruit 09/15/2010  . CAD (coronary artery disease) nonobstructive per cath 2012   . DM (diabetes mellitus) ([place]) 07/22/2009  . Hyperlipidemia LDL goal <70 07/22/2009  . Essential hypertension 07/22/2009    Past Surgical History:  Procedure Laterality Date  . ANGIOPLASTY / STENTING FEMORAL Left 12/11/2013   dr berry  . AV FISTULA PLACEMENT Left 03/19/2014   Procedure: CREATION OF ARTERIOVENOUS (AV) FISTULA  LEFT UPPER ARM;  Surgeon: Mal Misty, MD;  Location: [facility];  Service: Vascular;  Laterality: Left;  . BACK SURGERY  01/2018   screws placed   . CARDIAC CATHETERIZATION  2001 and 2010   . COLONOSCOPY W/ BIOPSIES AND POLYPECTOMY    . COLONOSCOPY WITH PROPOFOL N/A 08/01/2016   Procedure: COLONOSCOPY WITH PROPOFOL;  Surgeon: Carol Ada, MD;  Location: [facility];  Service: Endoscopy;  Laterality: N/A;  . CORONARY ARTERY BYPASS GRAFT  2019   [facility] x [facility] bypass  . ESOPHAGOGASTRODUODENOSCOPY (EGD) WITH PROPOFOL N/A 08/01/2016   Procedure: ESOPHAGOGASTRODUODENOSCOPY (EGD) WITH PROPOFOL;  Surgeon: Carol Ada, MD;  Location: [facility];  Service: Endoscopy;  Laterality: N/A;  . FOOT FRACTURE SURGERY Right    ligament repair  . FRACTURE SURGERY     left forearm  . GRAFT APPLICATION Right 10/12/1698   Procedure: FAT GRAFT APPLICATION;  Surgeon: Evelina Bucy, DPM;  Location: [facility];  Service: Podiatry;  Laterality:  Right;  . INGUINAL HERNIA REPAIR Left   . LEFT HEART CATH AND CORS/GRAFTS ANGIOGRAPHY N/A 04/27/2017   Procedure: LEFT HEART CATH AND CORS/GRAFTS ANGIOGRAPHY;  Surgeon: Leonie Man, MD;  Location: [facility] CV LAB;  Service: Cardiovascular;  Laterality: N/A;  . LEFT HEART CATHETERIZATION WITH CORONARY ANGIOGRAM N/A 06/22/2014   Procedure: LEFT HEART CATHETERIZATION WITH CORONARY ANGIOGRAM;  Surgeon: Troy Sine, MD;  Location: [facility] CATH LAB;  Service: Cardiovascular;  Laterality: N/A;  . LOWER EXTREMITY ANGIOGRAM Left 12/11/2013   Procedure: LOWER EXTREMITY ANGIOGRAM;  Surgeon: Lorretta Harp, MD;  Location: [facility] CATH LAB;  Service: Cardiovascular;  Laterality: Left;  . LUMBAR LAMINECTOMY/DECOMPRESSION MICRODISCECTOMY Right 07/03/2017   Procedure: MICRODISCECTOMY LUMBAR FIVE - SACRAL ONE RIGHT;  Surgeon: Consuella Lose, MD;  Location: [facility];  Service: Neurosurgery;  Laterality: Right;  . LUMBAR LAMINECTOMY/DECOMPRESSION MICRODISCECTOMY Right 10/19/2017   Procedure: MICRODISCECTOMY LUMBAR FIVE- SACRAL 1 ONE ;  Surgeon: Consuella Lose, MD;  Location: [facility];  Service: Neurosurgery;  Laterality: Right;  . TONSILLECTOMY AND ADENOIDECTOMY    . WISDOM TOOTH EXTRACTION    . WOUND DEBRIDEMENT Right 05/13/2019   Procedure: DEBRIDEMENT WOUND;  Surgeon: Evelina Bucy, DPM;  Location: [facility];  Service: Podiatry;  Laterality: Right;       Family History  Problem Relation Age of Onset  . Heart attack Sister        died @ 55  . Cancer Mother        died @ 65; unknown type  . Diabetes Brother        deceased  . Cirrhosis Father        alcohol related  . Diabetes Father   . Esophageal cancer Neg Hx   . Colon cancer Neg Hx   . Pancreatic cancer Neg Hx   . Stomach cancer Neg Hx     Social History   Tobacco Use  . Smoking status: Former Smoker    Packs/day: 1.00    Years: 2.00    Pack years: 2.00    Types: Cigarettes  . Smokeless tobacco: Never Used  . Tobacco  comment: quit smoking 40 yrs ago  Substance Use Topics  . Alcohol use: No    Alcohol/week: 0.0 standard drinks  . Drug use: No    Home Medications Prior to Admission medications   Medication Sig Start Date End Date Taking? Authorizing Provider  acetaminophen (TYLENOL) 500 MG tablet Take 1,000 mg by mouth every 4 (four) hours as needed for moderate pain or headache.     [provider]  aspirin EC 81 MG tablet Take 1 tablet (81 mg total) by mouth daily. 05/28/12   Richardson Dopp T, PA-C  atorvastatin (LIPITOR) 40 MG tablet Take 1 tablet (40 mg total) by mouth daily at 6 PM. 12/13/13   Brett Canales, PA-C  calcium acetate (PHOSLO) 667 MG capsule Take 3 capsules (2,001 mg total) by mouth 3 (three) times daily with meals. Patient taking differently: Take 1,334 mg by mouth 2 (two) times daily.  10/09/15   Janece Canterbury, MD  cinacalcet (SENSIPAR) 60 MG tablet Take 60 mg by mouth. Takes Monday, Wednesday, friday    [provider]  clopidogrel (PLAVIX) 75 MG tablet Take 75 mg by mouth daily. 07/29/18   [provider]  clotrimazole (LOTRIMIN) 1 % cream Apply between toes and under toes twice daily for 4 weeks. 09/17/18   Marzetta Board, DPM  collagenase (SANTYL) ointment Apply 1 application topically daily. Wound Measurement 1.0 x 1.0cm 06/06/19   Evelina Bucy, DPM  Etelcalcetide HCl (PARSABIV IV) Etelcalcetide Hermina Staggers) 04/04/19 04/02/20  [provider]  gabapentin (NEURONTIN) 100 MG capsule Take 100 mg by mouth 2 (two) times daily.     [provider]  loperamide (IMODIUM) 2 MG capsule Take 2 mg by mouth as needed for diarrhea or loose stools.    [provider]  multivitamin (RENA-VIT) TABS tablet Take 1 tablet by mouth at bedtime. 06/23/14   Domenic Polite, MD  mupirocin ointment (BACTROBAN) 2 % Apply to right foot once daily. 04/22/19 04/21/20  Marzetta Board, DPM  nitroGLYCERIN (NITROSTAT) 0.4 MG SL tablet Place 1 tablet (0.4 mg  total) under the tongue every 5 (five) minutes as needed for chest pain. 03/20/12   Barrett, Evelene Croon, PA-C  ONETOUCH ULTRA test strip  04/28/19   [provider]  oxyCODONE (OXY IR/ROXICODONE) 5 MG immediate release tablet Take 2 tablets (10 mg total) by mouth every  4 (four) hours as needed for moderate pain. 06/20/19   Evelina Bucy, DPM  pantoprazole (PROTONIX) 40 MG tablet Take 40 mg by mouth daily.    [provider]  polyethylene glycol (MIRALAX / GLYCOLAX) packet Take 17 g by mouth daily as needed for moderate constipation. 03/22/17   Aline August, MD  sucralfate (CARAFATE) 1 g tablet Take 1 g by mouth 2 (two) times daily. 05/26/19   [provider]    Allergies    Kiwi extract, Flexeril [cyclobenzaprine], Other, and Tape  Review of Systems   Review of Systems  Constitutional: Negative for appetite change, chills and fever.  HENT: Negative for ear pain, rhinorrhea, sneezing and sore throat.   Eyes: Negative for photophobia and visual disturbance.  Respiratory: Negative for cough, chest tightness, shortness of breath and wheezing.   Cardiovascular: Negative for chest pain and palpitations.  Gastrointestinal: Negative for abdominal pain, blood in stool, constipation, diarrhea, nausea and vomiting.  Genitourinary: Negative for dysuria, hematuria and urgency.  Musculoskeletal: Negative for myalgias.  Skin: Positive for wound. Negative for rash.  Neurological: Negative for dizziness, weakness and light-headedness.    Physical Exam Updated Vital Signs BP 127/77 (BP Location: Left Arm)   Pulse 84   Temp 98.5 F (36.9 C) (Oral)   Resp 14   Ht 5\' 11"  (1.803 m)   Wt 71.2 kg   SpO2 100%   BMI 21.90 kg/m   Physical Exam Vitals and nursing note reviewed.  Constitutional:      General: He is not in acute distress.    Appearance: He is well-developed.  HENT:     Head: Normocephalic and atraumatic.     Nose: Nose normal.  Eyes:     General: No  scleral icterus.       Left eye: No discharge.     Conjunctiva/sclera: Conjunctivae normal.  Cardiovascular:     Rate and Rhythm: Normal rate and regular rhythm.     Heart sounds: Normal heart sounds. No murmur. No friction rub. No gallop.   Pulmonary:     Effort: Pulmonary effort is normal. No respiratory distress.     Breath sounds: Normal breath sounds.  Abdominal:     General: Bowel sounds are normal. There is no distension.     Palpations: Abdomen is soft.     Tenderness: There is no abdominal tenderness. There is no guarding.  Musculoskeletal:        General: Normal range of motion.     Cervical back: Normal range of motion and neck supple.  Skin:    General: Skin is warm and dry.     Findings: No rash.     Comments: Wound noted to right foot with associated swelling and TTP. Normal ROM of digits, reports pain with movement of the fifth digit.  2+ DP pulses noted bilaterally.  Neurological:     Mental Status: He is alert.     Motor: No abnormal muscle tone.     Coordination: Coordination normal.          ED Results / Procedures / Treatments   Labs (all labs ordered are listed, but only abnormal results are displayed) Labs Reviewed  COMPREHENSIVE METABOLIC PANEL - Abnormal; Notable for the following components:      Result Value   Chloride 91 (*)    Glucose, Bld 127 (*)    Creatinine, Ser 7.20 (*)    GFR calc non Af Amer 7 (*)    GFR calc Af  Amer 8 (*)    All other components within normal limits  CBC WITH DIFFERENTIAL/PLATELET - Abnormal; Notable for the following components:   Hemoglobin 12.3 (*)    RDW 15.9 (*)    All other components within normal limits  CBG MONITORING, ED - Abnormal; Notable for the following components:   Glucose-Capillary 115 (*)    All other components within normal limits  CULTURE, BLOOD (ROUTINE X 2)  CULTURE, BLOOD (ROUTINE X 2)  SARS CORONAVIRUS 2 (TAT 6-24 HRS)  LACTIC ACID, PLASMA  PROTIME-INR  LACTIC ACID, PLASMA     EKG None  Radiology MR FOOT RIGHT W WO CONTRAST  Result Date: 07/23/2019 CLINICAL DATA:  Diabetic foot ulcer involving the lateral aspect of the foot near the fifth metatarsal head. EXAM: MRI OF THE RIGHT FOREFOOT WITHOUT AND WITH CONTRAST TECHNIQUE: Multiplanar, multisequence MR imaging of the right foot was performed before and after the administration of intravenous contrast. CONTRAST:  85mL GADAVIST GADOBUTROL 1 MMOL/ML IV SOLN COMPARISON:  Radiographs 06/12/2019 FINDINGS: There is an open wound noted along the lateral aspect of the forefoot at the level of the fifth metatarsal head. There is an underlying rim enhancing abscess measuring 2.5 cm wrapping around the fifth metatarsal neck area. There is abnormal this signal intensity and enhancement in the fifth metatarsal head and neck and also in the proximal fifth phalanx consistent with osteomyelitis. Probable septic arthritis at the fifth MTP joint. There is also abnormal T2 signal intensity and enhancement in the fourth metatarsal head suspicious for osteomyelitis. Severe diffuse cellulitis and myofasciitis without definite findings for pyomyositis. IMPRESSION: 1. Open wound along the lateral aspect of the forefoot at the level of the fifth metatarsal head with an underlying 2.5 cm abscess. 2. 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 There is also osteomyelitis involving the fourth metatarsal head. 3. Probable septic arthritis at the fifth MTP joint. 4. Severe cellulitis and myofasciitis without definite findings for pyomyositis. Electronically Signed   By: Marijo Sanes M.D.   On: 07/23/2019 10:03    Procedures .Critical Care Performed by: Delia Heady, PA-C Authorized by: Delia Heady, PA-C   Critical care provider statement:    Critical care time (minutes):  35   Critical care time was exclusive of:  Separately billable procedures and treating other patients   Critical care was necessary  to treat or prevent imminent or life-threatening deterioration of the following conditions:  Renal failure, sepsis, circulatory failure, cardiac failure and metabolic crisis   Critical care was time spent personally by me on the following activities:  Development of treatment plan with patient or surrogate, discussions with consultants, evaluation of patient's response to treatment, examination of patient, obtaining history from patient or surrogate, ordering and review of laboratory studies, ordering and review of radiographic studies, re-evaluation of patient's condition and review of old charts   I assumed direction of critical care for this patient from another provider in my specialty: no     (including critical care time)  Medications Ordered in ED Medications  piperacillin-tazobactam (ZOSYN) IVPB 2.25 g (has no administration in time range)  linezolid (ZYVOX) IVPB 600 mg (has no administration in time range)    ED Course  I have reviewed the triage vital signs and the nursing notes.  Pertinent labs & imaging results that were available during my care of the patient were reviewed by me and considered in my medical decision making (see chart for details).    MDM  Rules/Calculators/A&P                      69 year old male with a past medical history of ESRD on dialysis, diabetes presenting to the ED with a wound infection and osteomyelitis.  Patient underwent callus removal and graft placement in February 2021.  He has been going to the wound care center for the past 2 weeks but yesterday noted worsening drainage and pain.  He underwent MRI yesterday which showed right fifth MTP septic arthritis, abscess and osteomyelitis.  Patient denies any fevers, chills.  He was sent to the ER for IV antibiotics and admission.  Patient with normal lactic acid level here as well as white count.  He is afebrile without recent use of antipyretics.  Physical exam findings noted above with significant  swelling and pain with palpation. Patient began on linezolid and Zosyn and will be admitted to the hospitalist service.  MDM Number of Diagnoses or Management Options Osteomyelitis of right foot, unspecified type [facility]): new, needed workup Septic arthritis of right foot, due to unspecified organism [facility]): new, needed workup Wound infection: new, needed workup   Amount and/or Complexity of Data Reviewed Clinical lab tests: ordered and reviewed Tests in the radiology section of CPT: ordered and reviewed Tests in the medicine section of CPT: ordered and reviewed Review and summarize past medical records: yes Discuss the patient with other providers: yes  Risk of Complications, Morbidity, and/or Mortality Presenting problems: high Diagnostic procedures: high Management options: high    Final Clinical Impression(s) / ED Diagnoses Final diagnoses:  Wound infection  Osteomyelitis of right foot, unspecified type ([place])  Septic arthritis of right foot, due to unspecified organism [facility])    Rx / [place] Orders ED Discharge Orders    None       Delia Heady, PA-C 07/24/19 1309    Virgel Manifold, MD 07/26/19 1002

## 2019-07-25 ENCOUNTER — Other Ambulatory Visit: Payer: Self-pay | Admitting: Physician Assistant

## 2019-07-25 ENCOUNTER — Inpatient Hospital Stay (HOSPITAL_COMMUNITY): Payer: Medicare Other | Admitting: Certified Registered Nurse Anesthetist

## 2019-07-25 ENCOUNTER — Encounter (HOSPITAL_COMMUNITY): Payer: Self-pay | Admitting: Internal Medicine

## 2019-07-25 ENCOUNTER — Encounter (HOSPITAL_COMMUNITY)
Admission: EM | Disposition: A | Discharge status: Home / Self Care | Payer: Self-pay | Source: Home / Self Care | Attending: Internal Medicine

## 2019-07-25 DIAGNOSIS — M869 Osteomyelitis, unspecified: Secondary | ICD-10-CM | POA: Diagnosis not present

## 2019-07-25 DIAGNOSIS — M86271 Subacute osteomyelitis, right ankle and foot: Secondary | ICD-10-CM | POA: Diagnosis present

## 2019-07-25 DIAGNOSIS — E08621 Diabetes mellitus due to underlying condition with foot ulcer: Secondary | ICD-10-CM

## 2019-07-25 DIAGNOSIS — N186 End stage renal disease: Secondary | ICD-10-CM | POA: Diagnosis not present

## 2019-07-25 DIAGNOSIS — L089 Local infection of the skin and subcutaneous tissue, unspecified: Secondary | ICD-10-CM

## 2019-07-25 DIAGNOSIS — Z992 Dependence on renal dialysis: Secondary | ICD-10-CM

## 2019-07-25 DIAGNOSIS — L97412 Non-pressure chronic ulcer of right heel and midfoot with fat layer exposed: Secondary | ICD-10-CM

## 2019-07-25 DIAGNOSIS — E11628 Type 2 diabetes mellitus with other skin complications: Secondary | ICD-10-CM | POA: Diagnosis not present

## 2019-07-25 DIAGNOSIS — I10 Essential (primary) hypertension: Secondary | ICD-10-CM

## 2019-07-25 DIAGNOSIS — I5032 Chronic diastolic (congestive) heart failure: Secondary | ICD-10-CM

## 2019-07-25 DIAGNOSIS — I739 Peripheral vascular disease, unspecified: Secondary | ICD-10-CM | POA: Diagnosis not present

## 2019-07-25 DIAGNOSIS — E785 Hyperlipidemia, unspecified: Secondary | ICD-10-CM

## 2019-07-25 DIAGNOSIS — E1151 Type 2 diabetes mellitus with diabetic peripheral angiopathy without gangrene: Secondary | ICD-10-CM

## 2019-07-25 DIAGNOSIS — Z794 Long term (current) use of insulin: Secondary | ICD-10-CM

## 2019-07-25 HISTORY — PX: AMPUTATION: SHX166

## 2019-07-25 LAB — BASIC METABOLIC PANEL
Anion gap: 11 (ref 5–15)
BUN: 30 mg/dL — ABNORMAL HIGH (ref 8–23)
CO2: 31 mmol/L (ref 22–32)
Calcium: 8.6 mg/dL — ABNORMAL LOW (ref 8.9–10.3)
Chloride: 91 mmol/L — ABNORMAL LOW (ref 98–111)
Creatinine, Ser: 9.05 mg/dL — ABNORMAL HIGH (ref 0.61–1.24)
GFR calc Af Amer: 6 mL/min — ABNORMAL LOW (ref >60)
GFR calc non Af Amer: 5 mL/min — ABNORMAL LOW (ref >60)
Glucose, Bld: 94 mg/dL (ref 70–99)
Potassium: 4.2 mmol/L (ref 3.5–5.1)
Sodium: 133 mmol/L — ABNORMAL LOW (ref 135–145)

## 2019-07-25 LAB — CBC
HCT: 36.8 % — ABNORMAL LOW (ref 39.0–52.0)
Hemoglobin: 11.5 g/dL — ABNORMAL LOW (ref 13.0–17.0)
MCH: 29 pg (ref 26.0–34.0)
MCHC: 31.3 g/dL (ref 30.0–36.0)
MCV: 92.9 fL (ref 80.0–100.0)
Platelets: 235 10*3/uL (ref 150–400)
RBC: 3.96 MIL/uL — ABNORMAL LOW (ref 4.22–5.81)
RDW: 15.7 % — ABNORMAL HIGH (ref 11.5–15.5)
WBC: 5 10*3/uL (ref 4.0–10.5)
nRBC: 0 % (ref 0.0–0.2)

## 2019-07-25 LAB — SURGICAL PCR SCREEN
MRSA, PCR: NEGATIVE
Staphylococcus aureus: NEGATIVE

## 2019-07-25 LAB — GLUCOSE, CAPILLARY
Glucose-Capillary: 94 mg/dL (ref 70–99)
Glucose-Capillary: 83 mg/dL (ref 70–99)
Glucose-Capillary: 91 mg/dL (ref 70–99)
Glucose-Capillary: 75 mg/dL (ref 70–99)
Glucose-Capillary: 143 mg/dL — ABNORMAL HIGH (ref 70–99)
Glucose-Capillary: 134 mg/dL — ABNORMAL HIGH (ref 70–99)

## 2019-07-25 SURGERY — AMPUTATION, FOOT, RAY
Anesthesia: General | Laterality: Right

## 2019-07-25 MED ORDER — OXYCODONE HCL 5 MG/5ML PO SOLN: 5.0000 mg | Freq: Once | ORAL | Status: DC | PRN

## 2019-07-25 MED ORDER — 0.9 % SODIUM CHLORIDE (POUR BTL) OPTIME
TOPICAL | Status: DC | PRN
  Administered 2019-07-25: 1000 mL

## 2019-07-25 MED ORDER — SODIUM CHLORIDE 0.9 % IV SOLN: INTRAVENOUS | Status: DC

## 2019-07-25 MED ORDER — PRO-STAT SUGAR FREE PO LIQD
30.0000 mL | Freq: Three times a day (TID) | ORAL | Status: DC
  Administered 2019-07-26 – 2019-07-28 (×7): 30 mL via ORAL
  Filled 2019-07-25 (×8): qty 30

## 2019-07-25 MED ORDER — HYDROMORPHONE HCL 1 MG/ML IJ SOLN
INTRAMUSCULAR | Status: AC
  Filled 2019-07-25: qty 1

## 2019-07-25 MED ORDER — HYDROMORPHONE HCL 1 MG/ML IJ SOLN
0.2500 mg | INTRAMUSCULAR | Status: DC | PRN
  Administered 2019-07-25 (×2): 0.5 mg via INTRAVENOUS

## 2019-07-25 MED ORDER — PRO-STAT SUGAR FREE PO LIQD: 30.0000 mL | Freq: Three times a day (TID) | ORAL | Status: DC

## 2019-07-25 MED ORDER — FENTANYL CITRATE (PF) 100 MCG/2ML IJ SOLN
INTRAMUSCULAR | Status: AC
  Filled 2019-07-25: qty 2

## 2019-07-25 MED ORDER — OXYCODONE HCL 5 MG PO TABS: 5.0000 mg | ORAL_TABLET | Freq: Once | ORAL | Status: DC | PRN

## 2019-07-25 MED ORDER — HYDROMORPHONE HCL 1 MG/ML IJ SOLN
0.5000 mg | INTRAMUSCULAR | Status: DC | PRN
  Administered 2019-07-25: 0.5 mg via INTRAVENOUS
  Filled 2019-07-25: qty 1

## 2019-07-25 MED ORDER — ONDANSETRON HCL 4 MG/2ML IJ SOLN
INTRAMUSCULAR | Status: DC | PRN
  Administered 2019-07-25: 4 mg via INTRAVENOUS

## 2019-07-25 MED ORDER — DOCUSATE SODIUM 100 MG PO CAPS
100.0000 mg | ORAL_CAPSULE | Freq: Two times a day (BID) | ORAL | Status: DC
  Administered 2019-07-25 – 2019-07-28 (×6): 100 mg via ORAL
  Filled 2019-07-25 (×6): qty 1

## 2019-07-25 MED ORDER — CEFAZOLIN SODIUM-DEXTROSE 2-4 GM/100ML-% IV SOLN
2.0000 g | INTRAVENOUS | Status: AC
  Administered 2019-07-25: 2 g via INTRAVENOUS
  Filled 2019-07-25: qty 100

## 2019-07-25 MED ORDER — MIDAZOLAM HCL 2 MG/2ML IJ SOLN
INTRAMUSCULAR | Status: AC
  Filled 2019-07-25: qty 2

## 2019-07-25 MED ORDER — FENTANYL CITRATE (PF) 250 MCG/5ML IJ SOLN
INTRAMUSCULAR | Status: AC
  Filled 2019-07-25: qty 5

## 2019-07-25 MED ORDER — LIDOCAINE 2% (20 MG/ML) 5 ML SYRINGE
INTRAMUSCULAR | Status: DC | PRN
  Administered 2019-07-25: 60 mg via INTRAVENOUS

## 2019-07-25 MED ORDER — CHLORHEXIDINE GLUCONATE CLOTH 2 % EX PADS
6.0000 | MEDICATED_PAD | Freq: Every day | CUTANEOUS | Status: DC
  Administered 2019-07-25 – 2019-07-27 (×2): 6 via TOPICAL

## 2019-07-25 MED ORDER — MEPERIDINE HCL 25 MG/ML IJ SOLN: 6.2500 mg | INTRAMUSCULAR | Status: DC | PRN

## 2019-07-25 MED ORDER — PROMETHAZINE HCL 25 MG/ML IJ SOLN: 6.2500 mg | INTRAMUSCULAR | Status: DC | PRN

## 2019-07-25 MED ORDER — FENTANYL CITRATE (PF) 250 MCG/5ML IJ SOLN
INTRAMUSCULAR | Status: DC | PRN
  Administered 2019-07-25: 50 ug via INTRAVENOUS

## 2019-07-25 MED ORDER — PROPOFOL 10 MG/ML IV BOLUS
INTRAVENOUS | Status: DC | PRN
  Administered 2019-07-25: 200 mg via INTRAVENOUS

## 2019-07-25 SURGICAL SUPPLY — 30 items
BLADE SAW SGTL MED 73X18.5 STR (BLADE) IMPLANT
BLADE SURG 21 STRL SS (BLADE) ×2 IMPLANT
BNDG COHESIVE 4X5 TAN STRL (GAUZE/BANDAGES/DRESSINGS) ×2 IMPLANT
BNDG GAUZE ELAST 4 BULKY (GAUZE/BANDAGES/DRESSINGS) ×2 IMPLANT
COVER SURGICAL LIGHT HANDLE (MISCELLANEOUS) ×3 IMPLANT
COVER WAND RF STERILE (DRAPES) IMPLANT
DRAPE U-SHAPE 47X51 STRL (DRAPES) ×4 IMPLANT
DRSG ADAPTIC 3X8 NADH LF (GAUZE/BANDAGES/DRESSINGS) ×1 IMPLANT
DRSG PAD ABDOMINAL 8X10 ST (GAUZE/BANDAGES/DRESSINGS) ×4 IMPLANT
DURAPREP 26ML APPLICATOR (WOUND CARE) ×2 IMPLANT
ELECT REM PT RETURN 9FT ADLT (ELECTROSURGICAL) ×2
ELECTRODE REM PT RTRN 9FT ADLT (ELECTROSURGICAL) ×1 IMPLANT
GAUZE SPONGE 4X4 12PLY STRL (GAUZE/BANDAGES/DRESSINGS) IMPLANT
GLOVE BIOGEL PI IND STRL 9 (GLOVE) ×1 IMPLANT
GLOVE BIOGEL PI INDICATOR 9 (GLOVE) ×1
GLOVE SURG ORTHO 9.0 STRL STRW (GLOVE) ×2 IMPLANT
GOWN STRL REUS W/ TWL XL LVL3 (GOWN DISPOSABLE) ×2 IMPLANT
GOWN STRL REUS W/TWL XL LVL3 (GOWN DISPOSABLE) ×4
KIT BASIN OR (CUSTOM PROCEDURE TRAY) ×2 IMPLANT
KIT DRSG PREVENA PLUS 7DAY 125 (MISCELLANEOUS) ×2 IMPLANT
KIT TURNOVER KIT B (KITS) ×2 IMPLANT
NS IRRIG 1000ML POUR BTL (IV SOLUTION) ×2 IMPLANT
PACK ORTHO EXTREMITY (CUSTOM PROCEDURE TRAY) ×2 IMPLANT
PAD ARMBOARD 7.5X6 YLW CONV (MISCELLANEOUS) ×4 IMPLANT
PREVENA RESTOR ARTHOFORM 46X30 (CANNISTER) IMPLANT
STOCKINETTE IMPERVIOUS LG (DRAPES) ×2 IMPLANT
SUT ETHILON 2 0 PSLX (SUTURE) ×2 IMPLANT
TOWEL GREEN STERILE (TOWEL DISPOSABLE) ×2 IMPLANT
TUBE CONNECTING 12X1/4 (SUCTIONS) ×2 IMPLANT
YANKAUER SUCT BULB TIP NO VENT (SUCTIONS) ×2 IMPLANT

## 2019-07-25 NOTE — Progress Notes (Signed)
Patient dialysis treatment has been rescheduled for Saturday 4/17 per Dr. Marval Regal.  Primary RN Cameron Sprang made aware.

## 2019-07-25 NOTE — TOC Initial Note (Signed)
Transition of Care [facility]) - Initial/Assessment Note    Patient Details  Name: Jon Anderson MRN: [id number] Date of Birth: 1951/03/25  Transition of Care [facility]) CM/SW Contact:    Curlene Labrum, RN Phone Number: 07/25/2019, 4:28 PM  Clinical Narrative:                 Case management met with the patient.  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  Patient lives at home with wife and daughter.  PT considered patient having a wheelchair - patient declined and stated that he would not be able to use one in the doorways of his home nor with his gravel driveway.  Patient was encouraged that if he changed his mind to let nursing know prior to discharge.  Patient given Medicare choice for home health - patient with no preference.  Called [facility] for PT/OT - waiting for return call.    Barriers to Discharge: Continued Medical Work up   Patient Goals and CMS Choice Patient states their goals for this hospitalization and ongoing recovery are:: I believe I'll be going home tomorrow. CMS Medicare.gov Compare Post Acute Care list provided to:: Patient Choice offered to / list presented to : Patient  Expected Discharge Plan and Services                           DME Arranged: (patient declined wheelchair, currently has RW, 3:1 and cane)       Representative spoke with at DME Agency: Joen Laura HH Arranged: PT, OT [place] Agency: [facility] (formerly [employer]) Date [place]: 07/25/19 Time [place]: 1627 Representative spoke with at [place]: Joen Laura  Prior Living Arrangements/Services                       Activities of [place] Devices/Equipment: Radio producer (specify quad or straight), CBG Meter(straight) ADL Screening (condition at time of admission) Patient's cognitive ability adequate to safely complete daily activities?: Yes Is the patient deaf or have difficulty hearing?: No Does the patient  have difficulty seeing, even when wearing glasses/contacts?: No Does the patient have difficulty concentrating, remembering, or making decisions?: No Patient able to express need for assistance with ADLs?: Yes Does the patient have difficulty dressing or bathing?: No Independently performs ADLs?: Yes (appropriate for developmental age) Does the patient have difficulty walking or climbing stairs?: No Weakness of Legs: Right Weakness of Arms/Hands: None  Permission Sought/Granted                  Emotional Assessment              Admission diagnosis:  Wound infection [T14.8XXA, L08.9] Diabetic foot infection ([place]) [C16.384, L08.9] Septic arthritis of right foot, due to unspecified organism [facility]) [M00.9] Osteomyelitis of right foot, unspecified type [facility]) [M86.9] Patient Active Problem List   Diagnosis Date Noted  . Osteomyelitis of right foot ([place])   . Diabetic foot infection ([place]) 07/24/2019  . Chronic diastolic CHF (congestive heart failure) ([place]) 07/24/2019  . Diabetic ulcer of right midfoot associated with diabetes mellitus due to underlying condition, with fat layer exposed ([place])   . Gastroenteritis 04/24/2019  . Allergy, unspecified, initial encounter 01/27/2019  . DM neuropathy with neurologic complication ([place]) 53/64/6803  . GERD (gastroesophageal reflux disease) 02/28/2018  . Nephrolithiasis 02/28/2018  . Sciatic leg pain 02/28/2018  . Snores 02/28/2018  . Orthopnea  02/23/2018  . Elevated troponin 02/23/2018  . HNP (herniated nucleus pulposus), lumbar 07/03/2017  . Encounter for removal of sutures 06/01/2017  . Chest pain in adult 04/27/2017  . Restless leg syndrome, uncontrolled 04/27/2017  . 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 Anemia of chronic disease 03/17/2017  . ESRD (end stage renal disease) on dialysis ([place]) 03/17/2017  . Chronic chest pain 03/17/2017  . Type II diabetes mellitus ([place]) 03/17/2017  . Acute on chronic diastolic heart failure  ([place]) 03/17/2017  . Acute heart failure ([place]) 03/17/2017  . Lumbar radiculopathy 01/16/2017  . Pre-transplant evaluation for kidney transplant 06/15/2016  . Pain, unspecified 04/17/2016  . Increased frequency of urination 12/17/2015  . Nocturia 12/17/2015  . Chest pain, non-cardiac 10/09/2015  . Hypercalcemia 05/10/2015  . Other fluid overload 02/24/2015  . 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 Fatty (change of) liver, not elsewhere classified 07/02/2014  . Aftercare including intermittent dialysis ([place]) 06/24/2014  . Diarrhea, unspecified 06/24/2014  . Fever, unspecified 06/24/2014  . Iron deficiency anemia, unspecified 06/24/2014  . Other specified coagulation defects ([place]) 06/24/2014  . Pruritus, unspecified 06/24/2014  . Secondary hyperparathyroidism of renal origin ([place]) 06/24/2014  . Type 2 diabetes mellitus with diabetic peripheral angiopathy without gangrene ([place]) 06/24/2014  . Acute on chronic renal failure ([place]) 06/16/2014  . Shoulder pain, left 12/15/2013  . Chest pain 12/15/2013  . Claudication ([place]) 12/11/2013  . PVD (peripheral vascular disease) ([place]) 12/11/2013  . Carotid artery disease ([place]) 09/30/2013  . Acute chest pain 11/15/2012  . Bruit 09/15/2010  . CAD (coronary artery disease) nonobstructive per cath 2012   . Hyperlipidemia LDL goal <70 07/22/2009  . Essential hypertension 07/22/2009   PCP:  [name], MD Pharmacy:   [id number] [id number] [id number]: [phone]: [phone]    Social Determinants of Health (SDOH) Interventions    Readmission Risk Interventions No flowsheet data found.

## 2019-07-25 NOTE — Progress Notes (Signed)
Orthopedic Tech Progress Note Patient Details:  Jon Anderson [birth year] [id number]  Ortho Devices Type of Ortho Device: Postop shoe/boot Ortho Device/Splint Location: RLE Ortho Device/Splint Interventions: Ordered, Application   Post Interventions Patient Tolerated: Well Instructions Provided: Care of device   [name] 07/25/2019, 11:37 AM

## 2019-07-25 NOTE — Progress Notes (Signed)
1035 Received pt from PACU, A&O x4.  Right foot with Prevena wound vac dressing dry and intact but machine says leak. Dressing rewrapped with transparent wound vac dressing but still consistent with leak. Dr Sharol Given notified. Will replace it with the regular size wound vac with the [place] settings.

## 2019-07-25 NOTE — Consult Note (Addendum)
[facility] Renal Consultation Note    Indication for Consultation:  Management of ESRD/hemodialysis, anemia, hypertension/volume, and secondary hyperparathyroidism. PCP:  HPI: Jon Anderson is a 69 y.o. male with ESRD, CAD, T2DM, HTN, PAD, and GERD who was admitted with R foot infection.   Has been dealing with R foot wound for nearly 1 year - s/p debridement of R 5th digit callous in February 2021 and has been seeing the wound clinic regularly. At last appointment, they noted worsened appearance of wound with drainage and pain. He underwent foot MRI which showed osteomyelitis and possible abscess. He was directed to the ED for admission.  In ED - labs showed K 3.8, Ca 9.2, WBC 5.5, Hgb 12.3, LA 1.4, CRP < 0.5. Orthopedics was consulted and he underwent R 5th ray amputation this morning with wound vac placement.  Dialyzes on MWF schedule at [facility] - last HD was Wed 4/14 which he completed in entirety. Today is his usual HD day. No fever, chills, CP, dyspnea, abd pain.  Past Medical History:  Diagnosis Date  . Anemia of chronic disease   . CAD (coronary artery disease)    a.  Myoview 4/11: EF 53%, no scar or ischemia   c. MV 2012 Nl perfusion, apical thinning.  No ischemia or scar.  EF 49%, appears greater by visual estimate.;  d.  Dob stress echo 12/13:  Negative Dob stress echo. There is no evidence of ischemia.  The LVF is normal. b. Normal cors 2016.  . Carotid stenosis    a. <46% RICA, >27% LICA by duplex 0/3500  . Chronic chest pain    occ  . ESRD (end stage renal disease) on dialysis ([place])    M-W-F  . GERD (gastroesophageal reflux disease)   . HNP (herniated nucleus pulposus), lumbar   . HTN (hypertension)    echo 3/10: EF 60%, LAE  . Hyperlipidemia   . Nephrolithiasis    "passed them all"  . Peripheral arterial disease ([place])    a. s/p PTCA to L SFA.  Marland Kitchen Pneumonia yrs ago  . Restless legs   . Sciatic leg pain   . Sleep apnea    no cpap, needs to reschedule  appointment to set up aquiring cpap  . Snores    a. presumed OSA, pt has refused sleep eval in past.  . Type II diabetes mellitus ([place])    no longer on medications, checks blood glucose at home  . Urinary frequency    Past Surgical History:  Procedure Laterality Date  . ANGIOPLASTY / STENTING FEMORAL Left 12/11/2013   dr berry  . AV FISTULA PLACEMENT Left 03/19/2014   Procedure: CREATION OF ARTERIOVENOUS (AV) FISTULA  LEFT UPPER ARM;  Surgeon: Mal Misty, MD;  Location: [place];  Service: Vascular;  Laterality: Left;  . BACK SURGERY  01/2018   screws placed   . CARDIAC CATHETERIZATION  2001 and 2010   . COLONOSCOPY W/ BIOPSIES AND POLYPECTOMY    . COLONOSCOPY WITH PROPOFOL N/A 08/01/2016   Procedure: COLONOSCOPY WITH PROPOFOL;  Surgeon: Carol Ada, MD;  Location: [facility];  Service: Endoscopy;  Laterality: N/A;  . CORONARY ARTERY BYPASS GRAFT  2019   [facility] x 1 bypass  . ESOPHAGOGASTRODUODENOSCOPY (EGD) WITH PROPOFOL N/A 08/01/2016   Procedure: ESOPHAGOGASTRODUODENOSCOPY (EGD) WITH PROPOFOL;  Surgeon: Carol Ada, MD;  Location: [facility];  Service: Endoscopy;  Laterality: N/A;  . FOOT FRACTURE SURGERY Right    ligament repair  . FRACTURE SURGERY  left forearm  . GRAFT APPLICATION Right 08/14/3147   Procedure: FAT GRAFT APPLICATION;  Surgeon: Evelina Bucy, DPM;  Location: [place];  Service: Podiatry;  Laterality: Right;  . INGUINAL HERNIA REPAIR Left   . LEFT HEART CATH AND CORS/GRAFTS ANGIOGRAPHY N/A 04/27/2017   Procedure: LEFT HEART CATH AND CORS/GRAFTS ANGIOGRAPHY;  Surgeon: Leonie Man, MD;  Location: [place] CV LAB;  Service: Cardiovascular;  Laterality: N/A;  . LEFT HEART CATHETERIZATION WITH CORONARY ANGIOGRAM N/A 06/22/2014   Procedure: LEFT HEART CATHETERIZATION WITH CORONARY ANGIOGRAM;  Surgeon: Troy Sine, MD;  Location: [facility] CATH LAB;  Service: Cardiovascular;  Laterality: N/A;  . LOWER EXTREMITY ANGIOGRAM Left 12/11/2013    Procedure: LOWER EXTREMITY ANGIOGRAM;  Surgeon: Lorretta Harp, MD;  Location: [facility] CATH LAB;  Service: Cardiovascular;  Laterality: Left;  . LUMBAR LAMINECTOMY/DECOMPRESSION MICRODISCECTOMY Right 07/03/2017   Procedure: MICRODISCECTOMY LUMBAR FIVE - SACRAL ONE RIGHT;  Surgeon: Consuella Lose, MD;  Location: [place];  Service: Neurosurgery;  Laterality: Right;  . LUMBAR LAMINECTOMY/DECOMPRESSION MICRODISCECTOMY Right 10/19/2017   Procedure: MICRODISCECTOMY LUMBAR FIVE- SACRAL 1 ONE ;  Surgeon: Consuella Lose, MD;  Location: [place];  Service: Neurosurgery;  Laterality: Right;  . TONSILLECTOMY AND ADENOIDECTOMY    . WISDOM TOOTH EXTRACTION    . WOUND DEBRIDEMENT Right 05/13/2019   Procedure: DEBRIDEMENT WOUND;  Surgeon: Evelina Bucy, DPM;  Location: [facility];  Service: Podiatry;  Laterality: Right;   Family History  Problem Relation Age of Onset  . Heart attack Sister        died @ 90  . Cancer Mother        died @ 92; unknown type  . Diabetes Brother        deceased  . Cirrhosis Father        alcohol related  . Diabetes Father   . Esophageal cancer Neg Hx   . Colon cancer Neg Hx   . Pancreatic cancer Neg Hx   . Stomach cancer Neg Hx    Social History:  reports that he quit smoking about 45 years ago. His smoking use included cigarettes. He has a 2.00 pack-year smoking history. He has never used smokeless tobacco. He reports previous drug use. Drug: Marijuana. He reports that he does not drink alcohol.  ROS: As per HPI otherwise negative.  Physical Exam: Vitals:   07/25/19 1013 07/25/19 1014 07/25/19 1015 07/25/19 1032  BP: (!) 153/84   (!) 146/84  Pulse: 68 72 71 67  Resp: 18 14 15 17   Temp:   97.8 F (36.6 C) 97.6 F (36.4 C)  TempSrc:    Oral  SpO2: 99% 99% 99% 100%  Weight:      Height:         General: Well developed, well nourished, in no acute distress. Head: Normocephalic, atraumatic, sclera non-icteric, mucus membranes are moist. Neck:  Supple without lymphadenopathy/masses. JVD not elevated. Lungs: Clear bilaterally to auscultation without wheezes, rales, or rhonchi.  Heart: RRR with normal S1, S2. No murmurs, rubs, or gallops appreciated. Abdomen: Soft, non-tender, non-distended with normoactive bowel sounds. No rebound/guarding.  Musculoskeletal:  Strength and tone appear normal for age. Lower extremities: No LE edema. R foot is s/p 5th ray amputation, wound vac in place. Neuro: Alert and oriented X 3. Moves all extremities spontaneously. Psych:  Responds to questions appropriately with a normal affect. Dialysis Access: LUE AVF + bruit  Allergies  Allergen Reactions  . Kiwi Extract Itching, Swelling and  Other (See Comments)    Lips and face swell- breathing not affected  . Flexeril [Cyclobenzaprine]     Hands become flimsy, can not hold things  . Tape Other (See Comments)    "Plastic" tape causes blisters!!   Prior to Admission medications   Medication Sig Start Date End Date Taking? Authorizing Provider  acetaminophen (TYLENOL) 500 MG tablet Take 1,000 mg by mouth every 4 (four) hours as needed for moderate pain or headache.    Yes [provider]  aspirin EC 81 MG tablet Take 1 tablet (81 mg total) by mouth daily. 05/28/12  Yes Weaver, Scott T, PA-C  atorvastatin (LIPITOR) 40 MG tablet Take 1 tablet (40 mg total) by mouth daily at 6 PM. 12/13/13  Yes Brett Canales, PA-C  calcium acetate (PHOSLO) 667 MG capsule Take 3 capsules (2,001 mg total) by mouth 3 (three) times daily with meals. Patient taking differently: Take 1,334 mg by mouth 2 (two) times daily.  10/09/15  Yes Short, Noah Delaine, MD  clopidogrel (PLAVIX) 75 MG tablet Take 75 mg by mouth daily. 07/29/18  Yes [provider]  clotrimazole (LOTRIMIN) 1 % cream Apply between toes and under toes twice daily for 4 weeks. Patient taking differently: Apply 1 application topically daily. Apply between toes and under toes twice daily for 4 weeks. 09/17/18   Yes Marzetta Board, DPM  collagenase (SANTYL) ointment Apply 1 application topically daily. Wound Measurement 1.0 x 1.0cm 06/06/19  Yes Evelina Bucy, DPM  gabapentin (NEURONTIN) 100 MG capsule Take 100 mg by mouth 2 (two) times daily.    Yes [provider]  multivitamin (RENA-VIT) TABS tablet Take 1 tablet by mouth at bedtime. 06/23/14  Yes Domenic Polite, MD  mupirocin ointment (BACTROBAN) 2 % Apply to right foot once daily. 04/22/19 04/21/20 Yes [place], Stephani Police, DPM  nitroGLYCERIN (NITROSTAT) 0.4 MG SL tablet Place 1 tablet (0.4 mg total) under the tongue every 5 (five) minutes as needed for chest pain. 03/20/12  Yes Barrett, Evelene Croon, PA-C  oxyCODONE (OXY IR/ROXICODONE) 5 MG immediate release tablet Take 2 tablets (10 mg total) by mouth every 4 (four) hours as needed for moderate pain. 06/20/19  Yes Evelina Bucy, DPM  pantoprazole (PROTONIX) 40 MG tablet Take 40 mg by mouth daily.   Yes [provider]  polyethylene glycol (MIRALAX / GLYCOLAX) packet Take 17 g by mouth daily as needed for moderate constipation. 03/22/17  Yes Aline August, MD  sucralfate (CARAFATE) 1 g tablet Take 1 g by mouth 2 (two) times daily. 05/26/19  Yes [provider]  traMADol (ULTRAM) 50 MG tablet Take 50 mg by mouth in the morning, at noon, and at bedtime.   Yes [provider]  [facility] ULTRA test strip  04/28/19   [provider]   Current Facility-Administered Medications  Medication Dose Route Frequency Provider Last Rate Last Admin  . 0.9 %  sodium chloride infusion   Intravenous Continuous Persons, Bevely Palmer, PA      . acetaminophen (TYLENOL) tablet 650 mg  650 mg Oral Q6H PRN Persons, Bevely Palmer, PA       Or  . acetaminophen (TYLENOL) suppository 650 mg  650 mg Rectal Q6H PRN Persons, Bevely Palmer, PA      . atorvastatin (LIPITOR) tablet 40 mg  40 mg Oral q1800 Persons, Bevely Palmer, [place]   40 mg at 07/24/19 1732  . calcium acetate (PHOSLO) capsule 1,334 mg   1,334 mg Oral BID Persons, Stanton Kidney  Anne, [place]   1,334 mg at 07/24/19 2323  . calcium carbonate (dosed in mg elemental calcium) suspension 500 mg of elemental calcium  500 mg of elemental calcium Oral Q6H PRN Persons, Bevely Palmer, PA      . camphor-menthol [facility]) lotion 1 application  1 application Topical C7E PRN Persons, Bevely Palmer, PA       And  . hydrOXYzine (ATARAX/VISTARIL) tablet 25 mg  25 mg Oral Q8H PRN Persons, Bevely Palmer, PA      . cefTRIAXone (ROCEPHIN) 2 g in sodium chloride 0.9 % 100 mL IVPB  2 g Intravenous Q24H Persons, Bevely Palmer, PA   Stopped at 07/24/19 1624  . docusate sodium (COLACE) capsule 100 mg  100 mg Oral BID Persons, Bevely Palmer, PA      . docusate sodium [facility]) enema 283 mg  1 enema Rectal PRN Persons, Bevely Palmer, PA      . feeding supplement (NEPRO CARB STEADY) liquid 237 mL  237 mL Oral TID PRN Persons, Bevely Palmer, [place]      . Derrill Memo ON 07/26/2019] feeding supplement (PRO-STAT SUGAR FREE 64) liquid 30 mL  30 mL Oral TID Persons, Bevely Palmer, PA      . gabapentin (NEURONTIN) capsule 100 mg  100 mg Oral BID Persons, Bevely Palmer, PA   100 mg at 07/24/19 2300  . heparin injection 5,000 Units  5,000 Units Subcutaneous Q8H Persons, Bevely Palmer, [place]   5,000 Units at 07/24/19 2300  . HYDROmorphone (DILAUDID) 1 MG/ML injection           . HYDROmorphone (DILAUDID) injection 0.5 mg  0.5 mg Intravenous Q4H PRN Persons, Bevely Palmer, PA      . insulin aspart (novoLOG) injection 0-6 Units  0-6 Units Subcutaneous TID WC Persons, Bevely Palmer, PA      . linezolid (ZYVOX) IVPB 600 mg  600 mg Intravenous Q12H Persons, Bevely Palmer, PA 300 mL/hr at 07/24/19 2300 600 mg at 07/24/19 2300  . metroNIDAZOLE (FLAGYL) IVPB 500 mg  500 mg Intravenous Q8H Persons, Bevely Palmer, [place] 100 mL/hr at 07/24/19 2308 500 mg at 07/24/19 2308  . multivitamin (RENA-VIT) tablet 1 tablet  1 tablet Oral QHS Persons, Bevely Palmer, [place]   1 tablet at 07/24/19 2300  . ondansetron (ZOFRAN) tablet 4 mg  4 mg Oral Q6H PRN Persons, Bevely Palmer, PA       Or   . ondansetron [facility]) injection 4 mg  4 mg Intravenous Q6H PRN Persons, Bevely Palmer, [place]      . oxyCODONE (Oxy IR/ROXICODONE) immediate release tablet 10 mg  10 mg Oral Q4H PRN Persons, Bevely Palmer, PA   10 mg at 07/25/19 1046  . pantoprazole (PROTONIX) EC tablet 40 mg  40 mg Oral Daily Persons, Bevely Palmer, PA      . sorbitol 70 % solution 30 mL  30 mL Oral PRN Persons, Bevely Palmer, PA      . sucralfate (CARAFATE) tablet 1 g  1 g Oral BID Persons, Bevely Palmer, PA   1 g at 07/24/19 2300  . traMADol (ULTRAM) tablet 50 mg  50 mg Oral TID Persons, Bevely Palmer, PA   50 mg at 07/24/19 2300  . zolpidem (AMBIEN) tablet 5 mg  5 mg Oral QHS PRN Persons, Bevely Palmer, PA       Labs: Basic Metabolic Panel: Recent Labs  Lab 07/24/19 1042  NA 135  K 3.8  CL 91*  CO2 30  GLUCOSE 127*  BUN 22  CREATININE 7.20*  CALCIUM 9.2   Liver Function Tests: Recent Labs  Lab 07/24/19 1042  AST 19  ALT 16  ALKPHOS 115  BILITOT 0.6  PROT 7.8  ALBUMIN 3.7   CBC: Recent Labs  Lab 07/24/19 1042  WBC 5.5  NEUTROABS 2.4  HGB 12.3*  HCT 40.2  MCV 92.0  PLT 259   Dialysis Orders:  MWF at [facility] 4hr, 450/800, EDW 71.5kg, 2K/2Ca, UFP#2, AVF, heparin 5000 - Parsabiv 2.5mg  IV q HD - Mircera 70mcg IV q 2 weeks (last 4/2) - Calcitriol 0.48mcg PO q HD  Assessment/Plan: 1.  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 On Linezolid + Flagyl - abx per primary team. 2.  ESRD: Continue HD per MWF schedule - HD today, will likely be later this evening. No heparin d/t surgery this morning. 3.  Hypertension/volume: BP up slightly, UF as tolerated. 4.  Anemia of ESRD: Hgb 12.3 - no ESA for now, monitor. 5.  Metabolic bone disease: Ca ok, Resume home binders. 6.  Nutrition: Add Nepro for wound healing. 7. CAD 8. T2DM  Veneta Penton, Hershal Coria 07/25/2019, 11:31 AM  [facility]  I have seen and examined this patient and agree with plan and assessment in the above note with renal  recommendations/intervention highlighted.  Sitting in chair in NAD, wound vac in place.  Reports some discomfort of his right foot but otherwise doing well.  Plan for HD later today. Governor Rooks Mylee Falin,MD 07/25/2019 3:26 PM

## 2019-07-25 NOTE — Transfer of Care (Signed)
Immediate Anesthesia Transfer of Care Note  Patient: Jon Anderson  Procedure(s) Performed: RIGHT 5th RAY AMPUTATION (Right )  Patient Location: PACU  Anesthesia Type:General  Level of Consciousness: sedated  Airway & Oxygen Therapy: Patient Spontanous Breathing and Patient connected to face mask oxygen  Post-op Assessment: Report given to RN and Post -op Vital signs reviewed and stable  Post vital signs: Reviewed and stable  Last Vitals:  Vitals Value Taken Time  BP 94/59 07/25/19 0928  Temp    Pulse 69 07/25/19 0931  Resp 13 07/25/19 0931  SpO2 100 % 07/25/19 0931  Vitals shown include unvalidated device data.  Last Pain:  Vitals:   07/25/19 0753  TempSrc: Oral  PainSc:          Complications: No apparent anesthesia complications

## 2019-07-25 NOTE — Op Note (Signed)
07/25/2019  9:37 AM  PATIENT:  Jon Anderson    PRE-OPERATIVE DIAGNOSIS:  Right Foot Osteomyelitis  POST-OPERATIVE DIAGNOSIS:  Same  PROCEDURE:  RIGHT 5th RAY AMPUTATION Local tissue rearrangement for wound closure 7 x 3 cm. Application of Prevena customizable wound VAC.  SURGEON:  Newt Minion, MD  PHYSICIAN ASSISTANT:None ANESTHESIA:   General  PREOPERATIVE INDICATIONS:  TRAVAUGHN VUE is a  69 y.o. male with a diagnosis of Right Foot Osteomyelitis who failed conservative measures and elected for surgical management.    The risks benefits and alternatives were discussed with the patient preoperatively including but not limited to the risks of infection, bleeding, nerve injury, cardiopulmonary complications, the need for revision surgery, among others, and the patient was willing to proceed.  OPERATIVE IMPLANTS: Praveena customizable wound VAC  @ENCIMAGES @  OPERATIVE FINDINGS: Minimal petechial bleeding no abscess no necrotic muscle or tissue at the wound margins  OPERATIVE PROCEDURE: Patient was brought the operating room and underwent a general anesthetic.  After adequate levels anesthesia were obtained patient's right lower extremity was prepped using DuraPrep draped into a sterile field a timeout was called.  Elliptical incision was made around the toe and the ulcerative tissue this left incision it was 7 x 3 cm.  The fifth metatarsal was resected through the base.  There was minimal petechial bleeding however the wound margins were healthy and viable with no infection at the wound margins.  Wound was irrigated with normal saline electrocautery was used hemostasis local tissue rearrangement was used to close the wound 3 x 7 cm with 2-0 nylon.  A Praveena customizable wound VAC was applied this had a good suction fit patient was extubated taken the PACU in stable condition.   DISCHARGE PLANNING:  Antibiotic duration: Antibiotics for 24 hours  postoperatively  Weightbearing: Ideally nonweightbearing on the right however touchdown weightbearing would be acceptable  Pain medication: Opioid pathway  Dressing care/ Wound VAC: Continue wound VAC for 1 week  Ambulatory devices: Walker  Discharge to: Therapy to evaluate for discharge to home versus skilled nursing  Follow-up: In the office 1 week post operative.

## 2019-07-25 NOTE — Progress Notes (Signed)
Initial Nutrition Assessment  DOCUMENTATION CODES:   Not applicable  INTERVENTION:   -Continue renal MVI daily -30 ml Prostat TID, each supplement provides 100 kcals and 15 grams protein  NUTRITION DIAGNOSIS:   Increased nutrient needs related to wound healing as evidenced by estimated needs.  GOAL:   Patient will meet greater than or equal to 90% of their needs  MONITOR:   PO intake, Supplement acceptance, Diet advancement, Labs, Weight trends, Skin, I & O's  REASON FOR ASSESSMENT:   Consult Wound healing  ASSESSMENT:   Jon Anderson is a 69 y.o. male with medical history significant of DM, not on medication; OSA; PAD s/p stent; HLD; HTN; ESRD on MWF HD; and CAD s/p CABG presenting with diabetic foot infection.  4/13 MRI with open wound at 5th metatarsal head with underlying abscess; osteo of the 4th and 5th metatarsal heads and neck and proximal 5th phalanx; and severe cellulitis/myofasciitis without obvious pyomyositis.  January 16, he had a callous on his foot; it was so deep when they cut it out that he was "walking on bone" and they recommended collagen injection (done on 2/7 by Dr. March Rummage).  It didn't heal; 2 weeks later he went back and the infection was cleaned out and it still never healed.  He went to the [facility].  MRI was ordered and done on 4/13.  They recommended that he come over and have [place] look at it.  They said the infection was to the bone and he might lose that little toe.  No fevers.  He is not feeling sick other than the foot.  He does have some constipation.  Pt admitted with rt DM foot infection.   Reviewed I/O's: +740 ml x 24 hours  Pt down in OR at time of visit. RD unable to obtain further nutrition-related history or complete nutrition-focused physical exam at this time.  Per orthopedics notes, pt with osteomyelitis and ulceration over fifth metatarsal head on rt foot. Plan for rt foot fifth ray amputation today.   Pt was previously on  a renal/ carb modified diet pre-operatively, however, no completion data available to assess at this time.   Reviewed wt hx; pt has experienced a 6.9% wt loss over the past 3 months. Unsure of dry weight.   Given increased nutrient needs for post-operative healing, pt would greatly benefit from addition of oral nutrition supplements.   *Medications reviewed and include phoslo.  Lab Results  Component Value Date   HGBA1C 6.4 (H) 07/24/2019   PTA DM medications are none.   Labs reviewed: CBGS: 83-189 (inpatient orders for glycemic control are 0-6 units inuslin aspart TID with meals).   Diet Order:   Diet Order            Diet NPO time specified  Diet effective midnight              EDUCATION NEEDS:   No education needs have been identified at this time  Skin:  Skin Assessment: Skin Integrity Issues: Skin Integrity Issues:: Diabetic Ulcer Diabetic Ulcer: rt foot  Last BM:  07/24/19  Height:   Ht Readings from Last 1 Encounters:  07/25/19 5' 10.98" (1.803 m)    Weight:   Wt Readings from Last 1 Encounters:  07/25/19 71.2 kg    Ideal Body Weight:  78.2 kg  BMI:  Body mass index is 21.91 kg/m.  Estimated Nutritional Needs:   Kcal:  2150-1350  Protein:  110-125 grams  Fluid:  1000 ml + UOP    Loistine Chance, RD, LDN, [place] Registered Dietitian II Certified Diabetes Care and Education Specialist Please refer to [facility] for RD and/or RD on-call/weekend/after hours pager

## 2019-07-25 NOTE — Progress Notes (Signed)
PROGRESS NOTE    Jon Anderson  LKG:[id number] DOB: Aug 09, 1950 DOA: 07/24/2019 PCP: [name], MD    Brief Narrative:  69 year old male with history of diabetes, end-stage renal disease on hemodialysis, peripheral vascular disease, admitted to the hospital with right foot infection.  Seen by orthopedics and underwent fifth ray amputation.  He is on IV antibiotics.   Assessment & Plan:   Principal Problem:   Diabetic foot infection ([place]) Active Problems:   Hyperlipidemia LDL goal <70   Essential hypertension   PVD (peripheral vascular disease) ([place])   ESRD (end stage renal disease) on dialysis ([place])   Type 2 diabetes mellitus with diabetic peripheral angiopathy without gangrene ([place])   Chronic diastolic CHF (congestive heart failure) ([place])   Osteomyelitis of right foot ([place])   1. Diabetic foot infection.  Patient admitted with fifth metatarsal foot infection with possible underlying abscess with possible osteomyelitis.  He was started on broad-spectrum IV antibiotics.  Seen by orthopedics and underwent fifth toe amputation today.  Wound VAC is in place.  This will need to be continued as an outpatient.  Continue IV antibiotics for now.  Will reach out to infectious disease regarding further antibiotic regimen. 2. Peripheral vascular disease.  Recently had ABIs done in 04/2019 that were noncompressible.  Case reviewed with his primary cardiologist, Dr. Gwenlyn Found who recommended outpatient follow-up next week for repeat ABIs and follow-up clinic appointment the following week.  I have conveyed this message to the cardiology service who will arrange follow-up.  Continue on aspirin Plavix on discharge. 3. Diabetes.  A1c 6.5.  Appears to be diet controlled. 4. End-stage renal disease on hemodialysis.  Nephrology following.  Patient to undergo dialysis today. 5. Hyperlipidemia.  Continue Lipitor. 6. Chronic pain.  Continue on Neurontin, Ultram and oxycodone. 7. Chronic diastolic heart  failure.  Volume management with hemodialysis.  Currently appears compensated.   DVT prophylaxis: Heparin Code Status: Full code Family Communication: updated wife over the phone Disposition Plan:  Status is: Inpatient  Remains inpatient appropriate because:IV treatments appropriate due to intensity of illness or inability to take PO. Patient is currently on IV antibiotics.  Will plan on transitioning to po abx in next 24 hours if remains stable. Anticipate discharge in the next 24 hours if pain is controlled   Dispo: The patient is from: Home              Anticipated d/c is to: Home              Anticipated d/c date is: 1 day              Patient currently is not medically stable to d/c.            Consultants:   orthopedics  Procedures:   Right foot, 5th ray amputation  Antimicrobials:   Ceftriaxone 4/15 >  Metronidazole 4/15 >  Linezolid 4/15 >   Subjective: Patient seen in his room postoperatively.  Sitting up in bed.  Reports of pain around operative site.  Objective: Vitals:   07/25/19 1014 07/25/19 1015 07/25/19 1032 07/25/19 1430  BP:   (!) 146/84 140/60  Pulse: 72 71 67 65  Resp: 14 15 17 16   Temp:  97.8 F (36.6 C) 97.6 F (36.4 C) 98 F (36.7 C)  TempSrc:   Oral Oral  SpO2: 99% 99% 100% 100%  Weight:      Height:        Intake/Output Summary (Last 24 hours) at 07/25/2019  [place] filed at 07/25/2019 1300 Gross per 24 hour  Intake 1130 ml  Output 275 ml  Net 855 ml   Filed Weights   07/24/19 1033 07/25/19 0819  Weight: 71.2 kg 71.2 kg    Examination:  General exam: Appears calm and comfortable  Respiratory system: Clear to auscultation. Respiratory effort normal. Cardiovascular system: S1 & S2 heard, RRR. No JVD, murmurs, rubs, gallops or clicks. No pedal edema. Gastrointestinal system: Abdomen is nondistended, soft and nontender. No organomegaly or masses felt. Normal bowel sounds heard. Central nervous system: Alert and  oriented. No focal neurological deficits. Extremities: right foot with wound vac in place Skin: No rashes, lesions or ulcers Psychiatry: Judgement and insight appear normal. Mood & affect appropriate.     Data Reviewed: I have personally reviewed following labs and imaging studies  CBC: Recent Labs  Lab 07/24/19 1042 07/25/19 1107  WBC 5.5 5.0  NEUTROABS 2.4  --   HGB 12.3* 11.5*  HCT 40.2 36.8*  MCV 92.0 92.9  PLT 259 283   Basic Metabolic Panel: Recent Labs  Lab 07/24/19 1042 07/25/19 1107  NA 135 133*  K 3.8 4.2  CL 91* 91*  CO2 30 31  GLUCOSE 127* 94  BUN 22 30*  CREATININE 7.20* 9.05*  CALCIUM 9.2 8.6*   GFR: Estimated Creatinine Clearance: 7.8 mL/min (A) (by C-G formula based on SCr of 9.05 mg/dL (H)). Liver Function Tests: Recent Labs  Lab 07/24/19 1042  AST 19  ALT 16  ALKPHOS 115  BILITOT 0.6  PROT 7.8  ALBUMIN 3.7   No results for input(s): LIPASE, AMYLASE in the last 168 hours. No results for input(s): AMMONIA in the last 168 hours. Coagulation Profile: Recent Labs  Lab 07/24/19 1042  INR 1.0   Cardiac Enzymes: No results for input(s): CKTOTAL, CKMB, CKMBINDEX, TROPONINI in the last 168 hours. BNP (last 3 results) No results for input(s): PROBNP in the last 8760 hours. HbA1C: Recent Labs    07/24/19 1933  HGBA1C 6.4*   CBG: Recent Labs  Lab 07/24/19 2109 07/25/19 0638 07/25/19 0810 07/25/19 0930 07/25/19 1129  GLUCAP 189* 94 83 91 75   Lipid Profile: No results for input(s): CHOL, HDL, LDLCALC, TRIG, CHOLHDL, LDLDIRECT in the last 72 hours. Thyroid Function Tests: No results for input(s): TSH, T4TOTAL, FREET4, T3FREE, THYROIDAB in the last 72 hours. Anemia Panel: No results for input(s): VITAMINB12, FOLATE, FERRITIN, TIBC, IRON, RETICCTPCT in the last 72 hours. Sepsis Labs: Recent Labs  Lab 07/24/19 1043 07/24/19 1255  LATICACIDVEN 1.4 1.2    Recent Results (from the past 240 hour(s))  Culture, blood (Routine x 2)      Status: None (Preliminary result)   Collection Time: 07/24/19 10:40 AM   Specimen: BLOOD  Result Value Ref Range Status   Specimen Description BLOOD RIGHT ANTECUBITAL  Final   Special Requests   Final    BOTTLES DRAWN AEROBIC AND ANAEROBIC Blood Culture adequate volume   Culture   Final    NO GROWTH 1 DAY Performed at [facility], [address], [place], [place] [address]    Report Status PENDING  Incomplete  Culture, blood (Routine x 2)     Status: None (Preliminary result)   Collection Time: 07/24/19 12:40 PM   Specimen: BLOOD RIGHT ARM  Result Value Ref Range Status   Specimen Description BLOOD RIGHT ARM  Final   Special Requests   Final    BOTTLES DRAWN AEROBIC AND ANAEROBIC Blood Culture  adequate volume   Culture   Final    NO GROWTH 1 DAY Performed at [facility], [place] [address] [place], [place] [address]    Report Status PENDING  Incomplete  SARS CORONAVIRUS 2 (TAT 6-24 HRS) Nasopharyngeal Nasopharyngeal Swab     Status: None   Collection Time: 07/24/19  1:10 PM   Specimen: Nasopharyngeal Swab  Result Value Ref Range Status   SARS Coronavirus 2 NEGATIVE NEGATIVE Final    Comment: (NOTE) SARS-CoV-2 target nucleic acids are NOT DETECTED. The SARS-CoV-2 RNA is generally detectable in upper and lower respiratory specimens during the acute phase of infection. Negative results do not preclude SARS-CoV-2 infection, do not rule out co-infections with other pathogens, and should not be used as the sole basis for treatment or other patient management decisions. Negative results must be combined with clinical observations, patient history, and epidemiological information. The expected result is Negative. Fact Sheet for Patients: SugarRoll.be Fact Sheet for Healthcare Providers: https://www.woods-mathews.com/ This test is not yet approved or cleared by the [place] FDA and  has been authorized for detection and/or  diagnosis of SARS-CoV-2 by FDA under an Emergency Use Authorization (EUA). This EUA will remain  in effect (meaning this test can be used) for the duration of the COVID-19 declaration under Section 56 4(b)(1) of the Act, 21 U.S.C. section 360bbb-3(b)(1), unless the authorization is terminated or revoked sooner. Performed at [facility], [place] [address], [place], [place] [address]   Surgical pcr screen     Status: None   Collection Time: 07/24/19 11:17 PM   Specimen: Nasal Mucosa; Nasal Swab  Result Value Ref Range Status   MRSA, PCR NEGATIVE NEGATIVE Final   Staphylococcus aureus NEGATIVE NEGATIVE Final    Comment: (NOTE) The Xpert SA Assay (FDA approved for NASAL specimens in patients 39 years of age and older), is one component of a comprehensive surveillance program. It is not intended to diagnose infection nor to guide or monitor treatment. Performed at [facility], [place] [address], [place],  [address]          Radiology Studies: No results found.      Scheduled Meds: . atorvastatin  40 mg Oral q1800  . calcium acetate  1,334 mg Oral BID  . Chlorhexidine Gluconate Cloth  6 each Topical Q0600  . docusate sodium  100 mg Oral BID  . [START ON 07/26/2019] feeding supplement (PRO-STAT SUGAR FREE 64)  30 mL Oral TID  . gabapentin  100 mg Oral BID  . heparin  5,000 Units Subcutaneous Q8H  . HYDROmorphone      . insulin aspart  0-6 Units Subcutaneous TID WC  . multivitamin  1 tablet Oral QHS  . pantoprazole  40 mg Oral Daily  . sucralfate  1 g Oral BID  . traMADol  50 mg Oral TID   Continuous Infusions: . sodium chloride Stopped (07/25/19 1139)  . cefTRIAXone (ROCEPHIN)  IV Stopped (07/24/19 1624)  . linezolid (ZYVOX) IV 600 mg (07/24/19 2300)  . metronidazole 500 mg (07/25/19 1545)     LOS: 1 day    Time spent: 18mins    Kathie Dike, MD [facility]   If 7PM-7AM, please contact night-coverage [email or web address]  07/25/2019,  5:09 PM

## 2019-07-25 NOTE — Anesthesia Procedure Notes (Signed)
Procedure Name: LMA Insertion Date/Time: 07/25/2019 9:05 AM Performed by: Clearnce Sorrel, CRNA Pre-anesthesia Checklist: Patient identified, Emergency Drugs available, Suction available, Patient being monitored and Timeout performed Patient Re-evaluated:Patient Re-evaluated prior to induction Induction Type: IV induction LMA: LMA inserted LMA Size: 5.0 Placement Confirmation: positive ETCO2 and breath sounds checked- equal and bilateral Tube secured with: Tape Dental Injury: Teeth and Oropharynx as per pre-operative assessment

## 2019-07-25 NOTE — Anesthesia Preprocedure Evaluation (Signed)
Anesthesia Evaluation  Patient identified by MRN, date of birth, ID band Patient awake    Reviewed: Allergy & Precautions, NPO status , Patient's Chart, lab work & pertinent test results  Airway Mallampati: I  TM Distance: >3 FB Neck ROM: Full    Dental no notable dental hx. (+) Lower Dentures, Upper Dentures   Pulmonary sleep apnea , former smoker,    Pulmonary exam normal breath sounds clear to auscultation       Cardiovascular hypertension, Pt. on medications + CAD, + Peripheral Vascular Disease and + Orthopnea  Normal cardiovascular exam Rhythm:Regular Rate:Normal  04/24/19 ST R 102 w PVC and Qs in II,II, AVF EF 60-65%   Neuro/Psych negative neurological ROS  negative psych ROS   GI/Hepatic Neg liver ROS, GERD  Medicated,  Endo/Other  diabetes, Type 2  Renal/GU Dialysis and ESRFRenal diseaseMWF Dialysis K+ 4.0 Cr 11.17     Musculoskeletal negative musculoskeletal ROS (+)   Abdominal   Peds  Hematology  (+) anemia , Hgb 11.4 Plt 233   Anesthesia Other Findings   Reproductive/Obstetrics                             Anesthesia Physical  Anesthesia Plan  ASA: III  Anesthesia Plan: General   Post-op Pain Management:    Induction: Intravenous  PONV Risk Score and Plan: 2 and Treatment may vary due to age or medical condition, Ondansetron and Midazolam  Airway Management Planned: LMA  Additional Equipment: None  Intra-op Plan:   Post-operative Plan: Extubation in OR  Informed Consent: I have reviewed the patients History and Physical, chart, labs and discussed the procedure including the risks, benefits and alternatives for the proposed anesthesia with the patient or authorized representative who has indicated his/her understanding and acceptance.       Plan Discussed with: CRNA and Surgeon  Anesthesia Plan Comments:         Anesthesia Quick Evaluation

## 2019-07-25 NOTE — Progress Notes (Signed)
Pt is A&O x4, NPO post midnight maint. CHG baths completed. Right foot wound dry, open to air. Report given to short stay. 1980 To short stay.

## 2019-07-25 NOTE — Plan of Care (Signed)

## 2019-07-25 NOTE — Evaluation (Signed)
Physical Therapy Evaluation Patient Details Name: Jon Anderson MRN: [id number] DOB: [birth year] Today's Date: 07/25/2019   History of Present Illness  Pt is a 69 y/o male s/p R 5th ray amputation secondary to osteomyelitis. PMH including but not limited to DM, not on medication; OSA; PAD s/p stent; HLD; HTN; ESRD on MWF HD; and CAD s/p CABG.    Clinical Impression  Pt presented supine in bed with HOB elevated, awake and willing to participate in therapy session. Prior to admission, pt reported that he was independent with ADLs and ambulated with use of a cane. Pt lives with his wife and daughter in a single level home with one step to enter. At the time of evaluation, pt overall at a supervision to min guard level with mobility with use of RW for transfers. Pt able to maintain TDWB'ing (more like [place]) R LE throughout independently. Pt reporting concerns regarding car transfers and how he will safely mobilize for dialysis. PT encouraging pt and educating on how therapy services would continue to work with him while admitted and progress his mobility. A w/c with elevating leg rests would likely be very helpful for pt for longer distances while recovering. Pt would continue to benefit from skilled physical therapy services at this time while admitted and after d/c to address the below listed limitations in order to improve overall safety and independence with functional mobility.     Follow Up Recommendations Home health PT;Supervision for mobility/OOB    Equipment Recommendations  Wheelchair (measurements PT);Other (comment)(w/c with elevating leg rests)    Recommendations for Other Services       Precautions / Restrictions Precautions Precautions: Fall Precaution Comments: wound VAC Restrictions Weight Bearing Restrictions: Yes RLE Weight Bearing: Touchdown weight bearing      Mobility  Bed Mobility Overal bed mobility: Needs Assistance Bed Mobility: Supine to Sit      Supine to sit: Supervision     General bed mobility comments: for safety  Transfers Overall transfer level: Needs assistance Equipment used: Rolling walker (2 wheeled) Transfers: Sit to/from [employer] Transfers Sit to Stand: From elevated surface;Min guard Stand pivot transfers: Min guard       General transfer comment: cueing for safe hand placement and technique; min guard for safety; pt able to take small hops from bed to chair towards his L side  Ambulation/Gait                Stairs            Wheelchair Mobility    Modified Rankin (Stroke Patients Only)       Balance Overall balance assessment: Needs assistance Sitting-balance support: Feet supported Sitting balance-Leahy Scale: Good     Standing balance support: Bilateral upper extremity supported Standing balance-Leahy Scale: Poor                               Pertinent Vitals/Pain Pain Assessment: 0-10 Pain Score: 6  Pain Location: R foot Pain Descriptors / Indicators: Grimacing;Guarding;Stabbing;Throbbing Pain Intervention(s): Monitored during session;Repositioned;Patient requesting pain meds-RN notified    Home Living Family/patient expects to be discharged to:: Private residence Living Arrangements: Spouse/significant other;Children Available Help at Discharge: Family;Available 24 hours/day Type of Home: House Home Access: Stairs to enter   [employer] of Steps: 1 Home Layout: One level Home Equipment: Walker - 2 wheels;Cane - single point      Prior Function Level of Independence: Independent with assistive  device(s)         Comments: ambulates with use of a cane     Hand Dominance        Extremity/Trunk Assessment   Upper Extremity Assessment Upper Extremity Assessment: Defer to OT evaluation    Lower Extremity Assessment Lower Extremity Assessment: Overall WFL for tasks assessed;RLE deficits/detail RLE Deficits / Details: wound  VAC in place to distal aspect of foot. pt able to maintain TDWB'ing/NWB'ing throughout independently RLE Sensation: decreased light touch    Cervical / Trunk Assessment Cervical / Trunk Assessment: Normal  Communication   Communication: No difficulties  Cognition Arousal/Alertness: Awake/alert Behavior During Therapy: WFL for tasks assessed/performed Overall Cognitive Status: Within Functional Limits for tasks assessed                                        General Comments      Exercises     Assessment/Plan    PT Assessment Patient needs continued PT services  PT Problem List Decreased strength;Decreased range of motion;Decreased balance;Decreased mobility;Decreased coordination;Decreased knowledge of use of DME;Decreased safety awareness;Decreased knowledge of precautions;Pain       PT Treatment Interventions DME instruction;Gait training;Therapeutic activities;Stair training;Functional mobility training;Therapeutic exercise;Balance training;Neuromuscular re-education;Patient/family education    PT Goals (Current goals can be found in the Care Plan section)  Acute Rehab PT Goals Patient Stated Goal: "to go home" PT Goal Formulation: With patient Time For Goal Achievement: 08/08/19 Potential to Achieve Goals: Good    Frequency Min 5X/week   Barriers to discharge        Co-evaluation               AM-PAC PT "6 Clicks" Mobility  Outcome Measure Help needed turning from your back to your side while in a flat bed without using bedrails?: None Help needed moving from lying on your back to sitting on the side of a flat bed without using bedrails?: None Help needed moving to and from a bed to a chair (including a wheelchair)?: A Little Help needed standing up from a chair using your arms (e.g., wheelchair or bedside chair)?: None Help needed to walk in hospital room?: A Lot Help needed climbing 3-5 steps with a railing? : A Lot 6 Click Score: 19     End of Session Equipment Utilized During Treatment: Gait belt Activity Tolerance: Patient tolerated treatment well Patient left: in chair;with call bell/phone within reach;with chair alarm set Nurse Communication: Mobility status PT Visit Diagnosis: Other abnormalities of gait and mobility (R26.89)    Time: 1356-1420 PT Time Calculation (min) (ACUTE ONLY): 24 min   Charges:   PT Evaluation $PT Eval Moderate Complexity: 1 Mod PT Treatments $Therapeutic Activity: 8-22 mins        Anastasio Champion, DPT  Acute Rehabilitation Services Pager [phone] Office [place] 07/25/2019, 2:52 PM

## 2019-07-25 NOTE — Anesthesia Postprocedure Evaluation (Signed)
Anesthesia Post Note  Patient: Jon Anderson  Procedure(s) Performed: RIGHT 5th RAY AMPUTATION (Right )     Patient location during evaluation: PACU Anesthesia Type: General Level of consciousness: awake and alert Pain management: pain level controlled Vital Signs Assessment: post-procedure vital signs reviewed and stable Respiratory status: spontaneous breathing, nonlabored ventilation and respiratory function stable Cardiovascular status: blood pressure returned to baseline and stable Postop Assessment: no apparent nausea or vomiting Anesthetic complications: no    Last Vitals:  Vitals:   07/25/19 1015 07/25/19 1032  BP:  (!) 146/84  Pulse: 71 67  Resp: 15 17  Temp: 36.6 C 36.4 C  SpO2: 99% 100%    Last Pain:  Vitals:   07/25/19 1032  TempSrc: Oral  PainSc:                  Lynda Rainwater

## 2019-07-26 LAB — GLUCOSE, CAPILLARY
Glucose-Capillary: 79 mg/dL (ref 70–99)
Glucose-Capillary: 128 mg/dL — ABNORMAL HIGH (ref 70–99)
Glucose-Capillary: 97 mg/dL (ref 70–99)

## 2019-07-26 MED ORDER — DOXYCYCLINE HYCLATE 100 MG PO TABS
100.0000 mg | ORAL_TABLET | Freq: Two times a day (BID) | ORAL | Status: DC
  Administered 2019-07-27 – 2019-07-28 (×3): 100 mg via ORAL
  Filled 2019-07-26 (×3): qty 1

## 2019-07-26 NOTE — Progress Notes (Signed)
Patient ID: Jon Anderson, male   DOB: [birth year], 69 y.o.   MRN: [id number] Patient is postoperative day 1 right foot fifth ray amputation.  There is no drainage in the wound VAC canister there is 1 check.  Discussed that patient can discharge to home once he is safe with therapy.  Patient states that his home will not accommodate a wheelchair.  Discussed the importance of minimizing weightbearing on the right foot.  He will discharge with the portable Praveena wound VAC pump.

## 2019-07-26 NOTE — Progress Notes (Signed)
Pt reporting iv site that had just been started on dayshift- is burning when antibiotic started- line flushed but would not draw back- notified IV team- and another site started- got through most of that antibiotic when reported that site burning and swollen as well- again IV team alerted

## 2019-07-26 NOTE — Progress Notes (Signed)
Jon, Anderson ([id number]) Visit Report for 07/24/2019 HPI Details Patient Name: Date of Service: Jon Anderson, Jon Anderson 07/24/2019 9:00 AM Medical Record [id number] Patient Account Number: [id number] Date of Birth/Sex: Treating RN: 11/26/1950 (69 y.o. Jon Anderson Primary Care Provider: Jilda Anderson Other Clinician: Referring Provider: Treating Provider/Extender:Jon Anderson, Jon Anderson, Jon Anderson in Treatment: 2 History of Present Illness HPI Description: 07/09/2019 upon evaluation today patient appears to be doing somewhat poorly unfortunately in regard to the wound on his right lateral foot distally. He has been seen by Dr. March Anderson and has been under his care since January. He did have an arterial study on April 15, 2019 which did not appear to be too good at this point in my opinion. He was noncompressible in regard to the ABIs with abnormal TBI's that were actually measured at absent. With that being said the patient appears to have significant pain which is even to light touch I am not even sure how much we can be able to accomplish with him at this point secondary to this. I do believe Santyl could be of benefit. With that being said he does have a history of diabetes mellitus type 2 which is diet controlled his last hemoglobin A1c was 6.5 that was in September of last year. The patient also has what appears to be peripheral vascular disease he has had previous intervention on the left lower extremity. He also is on renal dialysis at this point. The patient is obviously frustrated with the situation at this time he is not had an MRI at this point either. He did have an allograft placed following debridement that was on May 13, 2019 by Dr. March Anderson. Unfortunately this really did not do much for him. He has had a prior x-ray by Dr. March Anderson as well which did not reveal obvious signs of osteomyelitis but despite this I am concerned about the possibility of a bone infection just  due to the chronic and longstanding nature of this wound despite aggressive interventions up to this point. He has been using mupirocin ointment on it currently. 07/17/2019; this is a patient who was admitted to the clinic last week by Jon Anderson. He has a history of known PAD and has a difficult wound over the fifth metatarsal head of the right foot that probes to bone. He has an MRI booked for April 12 but he has noninvasive studies apparently at vein and vascular on May 6 The patient has clear claudication probably even claudication at rest. He has been using Santyl to the wound. He has a surgical shoe Addendum 07/18/2019; I have reviewed Dr. Kennon Anderson notes on this patient. He had noninvasive arterial studies on 04/15/2019. He had noncompressible ABIs on the right which were listed as "barely biphasic" with an absent great toe pressure. On the left again noncompressible with barely biphasic waveforms. His great toe pressure was absent. At the time I do not think by reviewing Dr. Kennon Anderson notes on 3/3 there was felt to be a worrisome wound. He has apparently had recurrent calluses worked on by Dr. Milinda Anderson including a collagen implantation. After talking to the patient yesterday I am not completely sure when this wound actually happened. However the wound is a small wound but worrisome with necrotic tissue 100% probes easily to bone. Also I think the patient actually describes rest pain. He is on dialysis so there should not be an issue of concern with regards to contrast dye. I think he needs angiography 4/15; I spoke to Dr.  [name] this morning. He felt that he had adequate flow based on the noninvasive studies on 1/5. He did not feel he had a rate limiting stenosis. 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 Cellulitis. Electronic  Signature(s) Signed: 07/26/2019 6:55:41 AM By: Jon Ham MD Entered By: Jon Anderson on 07/24/2019 10:25:59 -------------------------------------------------------------------------------- Physical Exam Details Patient Name: Date of Service: Jon Anderson, Jon Anderson 07/24/2019 9:00 AM Medical Record [id number] Patient Account Number: [id number] Date of Birth/Sex: Treating RN: 1950-12-22 (69 y.o. Jon Anderson Primary Care Provider: Jilda Anderson Other Clinician: Referring Provider: Treating Provider/Extender:Jon Anderson, Jon Anderson, Jon Anderson in Treatment: 2 Constitutional Sitting or standing Blood Pressure is within target range for patient.. Pulse regular and within target range for patient.Marland Kitchen Respirations regular, non-labored and within target range.. Temperature is normal and within the target range for the patient.Marland Kitchen Appears in no distress. Notes Wound exam; small wound over the right fifth metatarsal head however this probes directly to bone very painful around the wound area. He does have a palpable dorsalis pedis pulse I did not feel a posterior tibial. Electronic Signature(s) Signed: 07/26/2019 6:55:41 AM By: Jon Ham MD Entered By: Jon Anderson on 07/24/2019 10:27:04 -------------------------------------------------------------------------------- Physician Orders Details Patient Name: Date of Service: Jon Anderson, Jon Anderson 07/24/2019 9:00 AM Medical Record [id number] Patient Account Number: [id number] Date of Birth/Sex: Treating RN: [birth year] (69 y.o. Jon Anderson Primary Care Provider: Jilda Anderson Other Clinician: Referring Provider: Treating Provider/Extender:Jon Anderson, Jon Anderson, Jon Anderson in Treatment: 2 Verbal / Phone Orders: No Diagnosis Coding ICD-10 Coding Code Description E11.621 Type 2 diabetes mellitus with foot ulcer L97.512 Non-pressure chronic ulcer of other part of right foot with fat layer exposed I73.89 Other  specified peripheral vascular diseases Z99.2 Dependence on renal dialysis N18.6 End stage renal disease Follow-up Appointments Return Appointment in: - Patient to go to the emergency department after this visit for evaluation of osteomyelitis and infection in right foot. Patient to call wound center once out of the hospital. Secondary Dressing Wound #1 Right,Lateral Foot Kerlix/Rolled Gauze Dry Gauze Off-Loading Open toe surgical shoe to: - right foot Electronic Signature(s) Signed: 07/24/2019 4:13:32 PM By: Jon Anderson Signed: 07/26/2019 6:55:41 AM By: Jon Ham MD Entered By: Jon Anderson on 07/24/2019 10:00:01 -------------------------------------------------------------------------------- Problem List Details Patient Name: Date of Service: Jon Anderson. 07/24/2019 9:00 AM Medical Record [id number] Patient Account Number: [id number] Date of Birth/Sex: Treating RN: [birth year] (69 y.o. [name] Primary Care Provider: Jilda Anderson Other Clinician: Referring Provider: Treating Provider/Extender:Tashai Catino, Jon Anderson, Jon Anderson in Treatment: 2 Active Problems ICD-10 Evaluated Encounter Code Description Active Date Today Diagnosis E11.621 Type 2 diabetes mellitus with foot ulcer 07/09/2019 No Yes L97.512 Non-pressure chronic ulcer of other part of right foot 07/09/2019 No Yes with fat layer exposed I73.89 Other specified peripheral vascular diseases 07/09/2019 No Yes Z99.2 Dependence on renal dialysis 07/09/2019 No Yes N18.6 End stage renal disease 07/09/2019 No Yes Inactive Problems Resolved Problems Electronic Signature(s) Signed: 07/26/2019 6:55:41 AM By: Jon Ham MD Entered By: Jon Anderson on 07/24/2019 10:24:13 -------------------------------------------------------------------------------- Progress Note Details Patient Name: Date of Service: Jon Anderson 07/24/2019 9:00 AM Medical Record [id number] Patient Account  Number: [id number] Date of Birth/Sex: Treating RN: 03/28/1951 (69 y.o. Jon Anderson Primary Care Provider: Jilda Anderson Other Clinician: Referring Provider: Treating Provider/Extender:Jon Anderson, Jon Anderson, Jon Anderson in Treatment: 2 Subjective History of Present Illness (HPI) 07/09/2019  upon evaluation today patient appears to be doing somewhat poorly unfortunately in regard to the wound on his right lateral foot distally. He has been seen by Dr. March Anderson and has been under his care since January. He did have an arterial study on April 15, 2019 which did not appear to be too good at this point in my opinion. He was noncompressible in regard to the ABIs with abnormal TBI's that were actually measured at absent. With that being said the patient appears to have significant pain which is even to light touch I am not even sure how much we can be able to accomplish with him at this point secondary to this. I do believe Santyl could be of benefit. With that being said he does have a history of diabetes mellitus type 2 which is diet controlled his last hemoglobin A1c was 6.5 that was in September of last year. The patient also has what appears to be peripheral vascular disease he has had previous intervention on the left lower extremity. He also is on renal dialysis at this point. The patient is obviously frustrated with the situation at this time he is not had an MRI at this point either. He did have an allograft placed following debridement that was on May 13, 2019 by Dr. March Anderson. Unfortunately this really did not do much for him. He has had a prior x-ray by Dr. March Anderson as well which did not reveal obvious signs of osteomyelitis but despite this I am concerned about the possibility of a bone infection just due to the chronic and longstanding nature of this wound despite aggressive interventions up to this point. He has been using mupirocin ointment on it currently. 07/17/2019; this is a patient who  was admitted to the clinic last week by Jon Anderson. He has a history of known PAD and has a difficult wound over the fifth metatarsal head of the right foot that probes to bone. He has an MRI booked for April 12 but he has noninvasive studies apparently at vein and vascular on May 6 The patient has clear claudication probably even claudication at rest. He has been using Santyl to the wound. He has a surgical shoe Addendum 07/18/2019; I have reviewed Dr. Kennon Anderson notes on this patient. He had noninvasive arterial studies on 04/15/2019. He had noncompressible ABIs on the right which were listed as "barely biphasic" with an absent great toe pressure. On the left again noncompressible with barely biphasic waveforms. His great toe pressure was absent. At the time I do not think by reviewing Dr. Kennon Anderson notes on 3/3 there was felt to be a worrisome wound. He has apparently had recurrent calluses worked on by Dr. Milinda Anderson including a collagen implantation. After talking to the patient yesterday I am not completely sure when this wound actually happened. However the wound is a small wound but worrisome with necrotic tissue 100% probes easily to bone. Also I think the patient actually describes rest pain. He is on dialysis so there should not be an issue of concern with regards to contrast dye. I think he needs angiography 4/15; I spoke to Jon Anderson this morning. He felt that he had adequate flow based on the noninvasive studies on 1/5. He did not feel he had a rate limiting stenosis. 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 Cellulitis. Objective Constitutional Sitting or  standing Blood Pressure is within target range for patient.. Pulse regular and within target range for patient.Marland Kitchen Respirations regular, non-labored and within target range.. Temperature  is normal and within the target range for the patient.Marland Kitchen Appears in no distress. Vitals Time Taken: 9:01 AM, Height: 71 in, Weight: 150 lbs, BMI: 20.9, Temperature: 98.6 F, Pulse: 94 bpm, Respiratory Rate: 18 breaths/min, Blood Pressure: 104/59 mmHg, Capillary Blood Glucose: 93 mg/dl. General Notes: Wound exam; small wound over the right fifth metatarsal head however this probes directly to bone very painful around the wound area. He does have a palpable dorsalis pedis pulse I did not feel a posterior tibial. Integumentary (Hair, Skin) Wound #1 status is Open. Original cause of wound was Gradually Appeared. The wound is located on the Right,Lateral Foot. The wound measures 0.7cm length x 1.5cm width x 0.5cm depth; 0.825cm^2 area and 0.412cm^3 volume. There is Fat Layer (Subcutaneous Tissue) Exposed exposed. There is no tunneling or undermining noted. There is a medium amount of serous drainage noted. The wound margin is flat and intact. There is no granulation within the wound bed. There is a large (67-100%) amount of necrotic tissue within the wound bed including Adherent Slough. Assessment Active Problems ICD-10 Type 2 diabetes mellitus with foot ulcer Non-pressure chronic ulcer of other part of right foot with fat layer exposed Other specified peripheral vascular diseases Dependence on renal dialysis End stage renal disease Plan Follow-up Appointments: Return Appointment in: - Patient to go to the emergency department after this visit for evaluation of osteomyelitis and infection in right foot. Patient to call wound center once out of the hospital. Secondary Dressing: Wound #1 Right,Lateral Foot: Kerlix/Rolled Gauze Dry Gauze Off-Loading: Open toe surgical shoe to: - right foot 1. Right lateral foot. This is a deep wound probing to bone. Markedly tender. 2. The patient is not systemically unwell but at this point I do not think this can be managed as an outpatient. I think he  will need to be admitted to hospital for IV antibiotics and surgical consultation. I have sent him to the emergency room 3. I would be surprised if it not felt that he needs 1/5 ray amputation there is simply too much infection here especially in the MTP joint itself which would be very difficult to eradicate with medical therapy. Electronic Signature(s) Signed: 07/26/2019 6:55:41 AM By: Jon Ham MD Entered By: Jon Anderson on 07/24/2019 10:28:29 -------------------------------------------------------------------------------- SuperBill Details Patient Name: Date of Service: DIONIS, AUTRY 07/24/2019 Medical Record [id number] Patient Account Number: [id number] Date of Birth/Sex: Treating RN: 03/09/1951 (69 y.o. Jon Anderson Primary Care Provider: Jilda Anderson Other Clinician: Referring Provider: Treating Provider/Extender:Jamell Laymon, Jon Anderson, Jon Anderson in Treatment: 2 Diagnosis Coding ICD-10 Codes Code Description E11.621 Type 2 diabetes mellitus with foot ulcer L97.512 Non-pressure chronic ulcer of other part of right foot with fat layer exposed I73.89 Other specified peripheral vascular diseases Z99.2 Dependence on renal dialysis N18.6 End stage renal disease Facility Procedures The patient participates with Medicare or their insurance follows the Medicare Facility Guidelines: CPT4 Code Description Modifier Quantity [id number] [place] VISIT-LEV 4 EST PT 1 Physician Procedures CPT4 Code Description: [id number] - WC PHYS LEVEL 3 - EST PT ICD-10 Diagnosis Description E11.621 Type 2 diabetes mellitus with foot ulcer L97.512 Non-pressure chronic ulcer of other part of right foot with Modifier: fat layer expos Quantity: 1 ed Electronic Signature(s) Signed: 07/26/2019 6:55:41 AM By: Jon Ham MD Entered By: Jon Anderson on 07/24/2019 10:29:21

## 2019-07-26 NOTE — Progress Notes (Signed)
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 PMH including but not limited to DM, not on medication; OSA; PAD s/p stent; HLD; HTN; ESRD on MWF HD; and CAD s/p CABG.    PT Comments    Pt showing progress towards goals as he was able to ambulate to and from the restroom during today's session. At this time ambulation is very unsteady requiring assist for balance. Would benefit from continued gait and stair training prior to d/c to maximize functional independence and safety with mobility.     Follow Up Recommendations  Home health PT;Supervision for mobility/OOB     Equipment Recommendations  Wheelchair (measurements PT);Other (comment)(w/c with elevating leg rests)    Recommendations for Other Services       Precautions / Restrictions Precautions Precautions: Fall Precaution Comments: wound VAC Restrictions Weight Bearing Restrictions: Yes RLE Weight Bearing: Touchdown weight bearing    Mobility  Bed Mobility Overal bed mobility: Needs Assistance Bed Mobility: Supine to Sit     Supine to sit: Supervision     General bed mobility comments: for safety  Transfers Overall transfer level: Needs assistance Equipment used: Rolling walker (2 wheeled) Transfers: Sit to/from [employer] Transfers Sit to Stand: From elevated surface;Min assist Stand pivot transfers: Min assist       General transfer comment: Cueing for technique and hand placement. Min A today as pt is fatigued from HD this AM.   Ambulation/Gait Ambulation/Gait assistance: Min assist Gait Distance (Feet): 15 Feet(x2) Assistive device: Rolling walker (2 wheeled) Gait Pattern/deviations: (hop to) Gait velocity: decreased   General Gait Details: Pt mildly unsteady with ambulation requiring min A for stability. He is  able to keep weight off R LE.    Stairs             Wheelchair Mobility    Modified Rankin (Stroke Patients Only)       Balance Overall balance assessment: Needs assistance Sitting-balance support: Feet supported Sitting balance-Leahy Scale: Good     Standing balance support: Bilateral upper extremity supported Standing balance-Leahy Scale: Poor                              Cognition Arousal/Alertness: Awake/alert Behavior During Therapy: WFL for tasks assessed/performed Overall Cognitive Status: Within Functional Limits for tasks assessed                                        Exercises      General Comments General comments (skin integrity, edema, etc.): Assisted to restroom, but was unable to have BM      Pertinent Vitals/Pain Pain Assessment: Faces Faces Pain Scale: Hurts little more Pain Location: R foot Pain Descriptors / Indicators: Grimacing;Guarding;Stabbing;Throbbing Pain Intervention(s): Monitored during session;Limited activity within patient's tolerance;Repositioned    Home Living                      Prior Function            PT Goals (current goals can now be found in the care plan section) Acute Rehab PT Goals Patient Stated Goal: "to go home" PT Goal Formulation: With patient Time For Goal Achievement: 08/08/19 Potential  to Achieve Goals: Good Progress towards PT goals: Progressing toward goals    Frequency    Min 5X/week      PT Plan Current plan remains appropriate    Co-evaluation              AM-PAC PT "6 Clicks" Mobility   Outcome Measure  Help needed turning from your back to your side while in a flat bed without using bedrails?: None Help needed moving from lying on your back to sitting on the side of a flat bed without using bedrails?: None Help needed moving to and from a bed to a chair (including a wheelchair)?: A Little Help needed standing up from a chair using your  arms (e.g., wheelchair or bedside chair)?: A Little Help needed to walk in hospital room?: A Little Help needed climbing 3-5 steps with a railing? : A Lot 6 Click Score: 19    End of Session Equipment Utilized During Treatment: Gait belt Activity Tolerance: Patient tolerated treatment well Patient left: in chair;with call bell/phone within reach;with chair alarm set Nurse Communication: Mobility status PT Visit Diagnosis: Other abnormalities of gait and mobility (R26.89)     Time: 1335-1414 PT Time Calculation (min) (ACUTE ONLY): 39 min  Charges:  $Gait Training: 23-37 mins $Therapeutic Activity: 8-22 mins                     Benjiman Core, [place] Pager [phone] Acute Rehab   Allena Katz 07/26/2019, 2:21 PM

## 2019-07-26 NOTE — Plan of Care (Signed)
  Problem: Pain Managment: Goal: General experience of comfort will improve Outcome: Progressing   Problem: Safety: Goal: Ability to remain free from injury will improve Outcome: Progressing   

## 2019-07-26 NOTE — Plan of Care (Signed)
  Problem: Coping: Goal: Level of anxiety will decrease Outcome: Progressing   Problem: Pain Managment: Goal: General experience of comfort will improve Outcome: Progressing   

## 2019-07-26 NOTE — Procedures (Signed)
I was present at this dialysis session. I have reviewed the session itself and made appropriate changes.   Vital signs in last 24 hours:  Temp:  [97.9 F (36.6 C)-98.3 F (36.8 C)] 98.3 F (36.8 C) (04/17 0904) Pulse Rate:  [65-80] 79 (04/17 0904) Resp:  [16-20] (P) 19 (04/17 1000) BP: (110-141)/(54-80) (P) 108/64 (04/17 1000) SpO2:  [95 %-100 %] 96 % (04/17 0904) Weight:  [76.9 kg] 76.9 kg (04/17 0904) Weight change: 0 kg Filed Weights   07/24/19 1033 07/25/19 0819 07/26/19 0904  Weight: 71.2 kg 71.2 kg 76.9 kg    Recent Labs  Lab 07/25/19 1107  NA 133*  K 4.2  CL 91*  CO2 31  GLUCOSE 94  BUN 30*  CREATININE 9.05*  CALCIUM 8.6*    Recent Labs  Lab 07/24/19 1042 07/25/19 1107  WBC 5.5 5.0  NEUTROABS 2.4  --   HGB 12.3* 11.5*  HCT 40.2 36.8*  MCV 92.0 92.9  PLT 259 235    Scheduled Meds: . atorvastatin  40 mg Oral q1800  . calcium acetate  1,334 mg Oral BID  . Chlorhexidine Gluconate Cloth  6 each Topical Q0600  . docusate sodium  100 mg Oral BID  . feeding supplement (PRO-STAT SUGAR FREE 64)  30 mL Oral TID  . gabapentin  100 mg Oral BID  . heparin  5,000 Units Subcutaneous Q8H  . insulin aspart  0-6 Units Subcutaneous TID WC  . multivitamin  1 tablet Oral QHS  . pantoprazole  40 mg Oral Daily  . sucralfate  1 g Oral BID  . traMADol  50 mg Oral TID   Continuous Infusions: . cefTRIAXone (ROCEPHIN)  IV Stopped (07/25/19 1915)  . linezolid (ZYVOX) IV 150 mL/hr at 07/26/19 0038  . metronidazole 500 mg (07/26/19 0607)   PRN Meds:.acetaminophen **OR** acetaminophen, calcium carbonate (dosed in mg elemental calcium), camphor-menthol **AND** hydrOXYzine, docusate sodium, feeding supplement (NEPRO CARB STEADY), HYDROmorphone (DILAUDID) injection, ondansetron **OR** ondansetron (ZOFRAN) IV, oxyCODONE, sorbitol, zolpidem    Dialysis Orders:  MWF at [facility] 4hr, 450/800, EDW 71.5kg, 2K/2Ca, UFP#2, AVF, heparin 5000 - Parsabiv 2.5mg  IV q HD - Mircera 41mcg IV q 2  weeks (last 4/2) - Calcitriol 0.57mcg PO q HD  Assessment/Plan: 1.  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 On Linezolid + Flagyl - abx per primary team. 2.  ESRD: Continue HD per MWF schedule - off schedule due to large census.  HD today and will get back on schedule Monday.  No heparin d/t surgery this morning. 3.  Hypertension/volume: stable, UF as tolerated. 4.  Anemia of ESRD: Hgb 12.3 --> 11.5. no ESA for now, monitor. 5.  Metabolic bone disease: Ca ok, Resume home binders. 6.  Nutrition: Add Nepro for wound healing. 7. CAD - stable 8. T2DM - per primary svc   Donetta Potts,  MD 07/26/2019, 10:43 AM

## 2019-07-26 NOTE — Progress Notes (Signed)
PROGRESS NOTE    Jon Anderson  PIR:[id number] DOB: May 22, 1950 DOA: 07/24/2019 PCP: [name], MD    Brief Narrative:  Jon Anderson is a 69 year old male with past medical history of type 2 diabetes mellitus, ESRD on HD, peripheral vascular disease, chronic pain syndrome, HLD who was admitted for right foot diabetic infection/osteomyelitis.  He was seen by orthopedics and underwent fifth ray amputation.   Assessment & Plan:   Principal Problem:   Diabetic foot infection ([place]) Active Problems:   Hyperlipidemia LDL goal <70   Essential hypertension   PVD (peripheral vascular disease) ([place])   ESRD (end stage renal disease) on dialysis ([place])   Type 2 diabetes mellitus with diabetic peripheral angiopathy without gangrene (HCC)   Chronic diastolic CHF (congestive heart failure) (HCC)   Osteomyelitis of right foot (HCC)   Right diabetic foot infection/abscess with osteomyelitis MRI right foot 07/22/2019 with open wound fifth metatarsal head with underlying centimeter abscess and osteomyelitis of the fourth and fifth metatarsal heads and proximal fifth phalanx with severe cellulitis/myofasciitis, probable septic arthritis fifth MTP joint.  Underwent right fifth amputation with wound VAC placement by Dr. Sharol Given on 07/25/2019.  No infection at the wound margins were noted. --Continue antibiotics with Zyvox, ceftriaxone, Flagyl 24 hours postoperatively then will switch to doxycycline 100 mg p.o. twice daily x7 days after discussion with ID, Dr. Linus Salmons --Continue pain control with oxycodone 10mg  q4prn for moderate pain and Dilaudid IV prn for severe breakthrough pain  Type 2 diabetes mellitus 6.4 07/24/2019, well controlled.  Diet controlled at home. --Insulin sliding scale for coverage  Hyperlipidemia: Continue atorvastatin 40 mg p.o. daily  ESRD on HD MWF --Nephrology following, appreciate assistance --HD today  Chronic diastolic congestive heart failure, compensated TTE  02/27/2018 with preserved EF and grade 1 diastolic dysfunction. --Continue volume management with HD  Chronic pain syndrome --Continue home gabapentin 100 mg p.o. twice daily --Oxycodone 10 mg every 4 hours as needed --Dilaudid 0.5 mg every 4 hours IV for severe breakthrough pain --Colace 100mg  p.o. twice daily   DVT prophylaxis: Heparin Code Status: Full Code Family Communication: Updated patient extensively at bedside  Disposition Plan:  Status is: Inpatient  Remains inpatient appropriate because:Ongoing active pain requiring inpatient pain management, Unsafe d/c plan, IV treatments appropriate due to intensity of illness or inability to take PO and Inpatient level of care appropriate due to severity of illness   Dispo: The patient is from: Home              Anticipated d/c is to: Home              Anticipated d/c date is: 2 days              Patient currently is not medically stable to d/c.   Consultants:   Orthopedics -Dr. 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  Sitting in bedside chair.  Complaining of neuropathic pain with pins-and-needles sensations to his right plantar surface of foot.  Slightly anxious.  Concerned about his mobility when he returns home and going to appointments, states his daughter will drive him.  No other complaints or concerns at this time.  Denies headache, no chest pain, no palpitations, no shortness of breath, no abdominal pain, no fever/chills/night sweats, no nausea/vomiting/diarrhea.  No acute events overnight per nursing staff.  Objective:  Vitals:   07/26/19 1137 07/26/19 1153 07/26/19 1235 07/26/19 1330  BP: 101/60 (!) 104/58 (!) 77/52 95/61  Pulse: 93 93 (!) 103   Resp: 17 19 19    Temp:      TempSrc:      SpO2:      Weight:      Height:         Intake/Output Summary (Last 24 hours) at 07/26/2019 1456 Last data filed at 07/26/2019 0800 Gross per 24 hour  Intake 1824.1 ml  Output 300 ml  Net 1524.1 ml   Filed Weights   07/24/19 1033 07/25/19 0819 07/26/19 0904  Weight: 71.2 kg 71.2 kg 76.9 kg    Examination:  General exam: Appears calm and comfortable  Respiratory system: Clear to auscultation. Respiratory effort normal. Cardiovascular system: S1 & S2 heard, RRR. No JVD, murmurs, rubs, gallops or clicks. No pedal edema. Gastrointestinal system: Abdomen is nondistended, soft and nontender. No organomegaly or masses felt. Normal bowel sounds heard. Central nervous system: Alert and oriented. No focal neurological deficits. Extremities: Symmetric 5 x 5 power.  Noted right foot with wound VAC in place. Skin: No rashes, lesions or ulcers Psychiatry: Judgement and insight appear normal. Mood & affect appropriate.     Data Reviewed: I have personally reviewed following labs and imaging studies  CBC: Recent Labs  Lab 07/24/19 1042 07/25/19 1107  WBC 5.5 5.0  NEUTROABS 2.4  --   HGB 12.3* 11.5*  HCT 40.2 36.8*  MCV 92.0 92.9  PLT 259 409   Basic Metabolic Panel: Recent Labs  Lab 07/24/19 1042 07/25/19 1107  NA 135 133*  K 3.8 4.2  CL 91* 91*  CO2 30 31  GLUCOSE 127* 94  BUN 22 30*  CREATININE 7.20* 9.05*  CALCIUM 9.2 8.6*   GFR: Estimated Creatinine Clearance: 8.2 mL/min (A) (by C-G formula based on SCr of 9.05 mg/dL (H)). Liver Function Tests: Recent Labs  Lab 07/24/19 1042  AST 19  ALT 16  ALKPHOS 115  BILITOT 0.6  PROT 7.8  ALBUMIN 3.7   No results for input(s): LIPASE, AMYLASE in the last 168 hours. No results for input(s): AMMONIA in the last 168 hours. Coagulation Profile: Recent Labs  Lab 07/24/19 1042  INR 1.0   Cardiac Enzymes: No results for input(s): CKTOTAL, CKMB, CKMBINDEX, TROPONINI in the last 168 hours. BNP (last 3 results) No results for input(s): PROBNP in the last 8760  hours. HbA1C: Recent Labs    07/24/19 1933  HGBA1C 6.4*   CBG: Recent Labs  Lab 07/25/19 0930 07/25/19 1129 07/25/19 1743 07/25/19 2121 07/26/19 0650  GLUCAP 91 75 143* 134* 79   Lipid Profile: No results for input(s): CHOL, HDL, LDLCALC, TRIG, CHOLHDL, LDLDIRECT in the last 72 hours. Thyroid Function Tests: No results for input(s): TSH, T4TOTAL, FREET4, T3FREE, THYROIDAB in the last 72 hours. Anemia Panel: No results for input(s): VITAMINB12, FOLATE, FERRITIN, TIBC, IRON, RETICCTPCT in the last 72 hours. Sepsis Labs: Recent Labs  Lab 07/24/19 1043 07/24/19 1255  LATICACIDVEN 1.4 1.2    Recent Results (from the past 240 hour(s))  Culture, blood (Routine x 2)     Status: None (Preliminary result)   Collection Time: 07/24/19 10:40 AM   Specimen: BLOOD  Result Value Ref Range Status   Specimen Description BLOOD RIGHT ANTECUBITAL  Final   Special Requests   Final    BOTTLES DRAWN AEROBIC AND ANAEROBIC Blood Culture adequate volume   Culture   Final    NO  GROWTH 2 DAYS Performed at [facility], [place] [address] [place], [place] [address]    Report Status PENDING  Incomplete  Culture, blood (Routine x 2)     Status: None (Preliminary result)   Collection Time: 07/24/19 12:40 PM   Specimen: BLOOD RIGHT ARM  Result Value Ref Range Status   Specimen Description BLOOD RIGHT ARM  Final   Special Requests   Final    BOTTLES DRAWN AEROBIC AND ANAEROBIC Blood Culture adequate volume   Culture   Final    NO GROWTH 2 DAYS Performed at [facility], [place] [address], [place], [place] [address]    Report Status PENDING  Incomplete  SARS CORONAVIRUS 2 (TAT 6-24 HRS) Nasopharyngeal Nasopharyngeal Swab     Status: None   Collection Time: 07/24/19  1:10 PM   Specimen: Nasopharyngeal Swab  Result Value Ref Range Status   SARS Coronavirus 2 NEGATIVE NEGATIVE Final    Comment: (NOTE) SARS-CoV-2 target nucleic acids are NOT DETECTED. The SARS-CoV-2 RNA is generally  detectable in upper and lower respiratory specimens during the acute phase of infection. Negative results do not preclude SARS-CoV-2 infection, do not rule out co-infections with other pathogens, and should not be used as the sole basis for treatment or other patient management decisions. Negative results must be combined with clinical observations, patient history, and epidemiological information. The expected result is Negative. Fact Sheet for Patients: SugarRoll.be Fact Sheet for Healthcare Providers: https://www.woods-mathews.com/ This test is not yet approved or cleared by the [place] FDA and  has been authorized for detection and/or diagnosis of SARS-CoV-2 by FDA under an Emergency Use Authorization (EUA). This EUA will remain  in effect (meaning this test can be used) for the duration of the COVID-19 declaration under Section 56 4(b)(1) of the Act, 21 U.S.C. section 360bbb-3(b)(1), unless the authorization is terminated or revoked sooner. Performed at [facility], [place] [address] [place], [place] [address]   Surgical pcr screen     Status: None   Collection Time: 07/24/19 11:17 PM   Specimen: Nasal Mucosa; Nasal Swab  Result Value Ref Range Status   MRSA, PCR NEGATIVE NEGATIVE Final   Staphylococcus aureus NEGATIVE NEGATIVE Final    Comment: (NOTE) The Xpert SA Assay (FDA approved for NASAL specimens in patients 70 years of age and older), is one component of a comprehensive surveillance program. It is not intended to diagnose infection nor to guide or monitor treatment. Performed at [facility], [place] [address] [place], [place] [address]          Radiology Studies: No results found.      Scheduled Meds: . atorvastatin  40 mg Oral q1800  . calcium acetate  1,334 mg Oral BID  . Chlorhexidine Gluconate Cloth  6 each Topical Q0600  . docusate sodium  100 mg Oral BID  . feeding supplement (PRO-STAT  SUGAR FREE 64)  30 mL Oral TID  . gabapentin  100 mg Oral BID  . heparin  5,000 Units Subcutaneous Q8H  . insulin aspart  0-6 Units Subcutaneous TID WC  . multivitamin  1 tablet Oral QHS  . pantoprazole  40 mg Oral Daily  . sucralfate  1 g Oral BID  . traMADol  50 mg Oral TID   Continuous Infusions: . cefTRIAXone (ROCEPHIN)  IV Stopped (07/25/19 1915)  . linezolid (ZYVOX) IV 600 mg (07/26/19 1340)  . metronidazole 500 mg (07/26/19 0607)     LOS: 2  days    Time spent: 36 minutes spent on chart review, discussion with nursing staff, consultants, updating family and interview/physical exam; more than 50% of that time was spent in counseling and/or coordination of care.    Shirley Decamp J [place] DO [facility] Available via Epic secure chat 7am-7pm After these hours, please refer to coverage provider listed on [email or web address] 07/26/2019, 2:56 PM

## 2019-07-27 LAB — GLUCOSE, CAPILLARY
Glucose-Capillary: 84 mg/dL (ref 70–99)
Glucose-Capillary: 95 mg/dL (ref 70–99)
Glucose-Capillary: 121 mg/dL — ABNORMAL HIGH (ref 70–99)
Glucose-Capillary: 120 mg/dL — ABNORMAL HIGH (ref 70–99)

## 2019-07-27 MED ORDER — BISACODYL 5 MG PO TBEC
5.0000 mg | DELAYED_RELEASE_TABLET | Freq: Every day | ORAL | Status: DC | PRN
  Administered 2019-07-27: 5 mg via ORAL
  Filled 2019-07-27: qty 1

## 2019-07-27 MED ORDER — CALCITRIOL 0.5 MCG PO CAPS
0.5000 ug | ORAL_CAPSULE | ORAL | Status: DC
  Administered 2019-07-28: 0.5 ug via ORAL
  Filled 2019-07-27: qty 1

## 2019-07-27 MED ORDER — CHLORHEXIDINE GLUCONATE CLOTH 2 % EX PADS
6.0000 | MEDICATED_PAD | Freq: Every day | CUTANEOUS | Status: DC
  Administered 2019-07-28: 05:00:00 6 via TOPICAL

## 2019-07-27 NOTE — Progress Notes (Signed)
PROGRESS NOTE    Jon Anderson  POE:[id number] DOB: 1951-04-04 DOA: 07/24/2019 PCP: [name], MD    Brief Narrative:  Jon Anderson is a 69 year old male with past medical history of type 2 diabetes mellitus, ESRD on HD, peripheral vascular disease, chronic pain syndrome, HLD who was admitted for right foot diabetic infection/osteomyelitis.  He was seen by orthopedics and underwent fifth ray amputation.   Assessment & Plan:   Principal Problem:   Diabetic foot infection ([place]) Active Problems:   Hyperlipidemia LDL goal <70   Essential hypertension   PVD (peripheral vascular disease) ([place])   ESRD (end stage renal disease) on dialysis ([place])   Type 2 diabetes mellitus with diabetic peripheral angiopathy without gangrene (HCC)   Chronic diastolic CHF (congestive heart failure) (HCC)   Osteomyelitis of right foot (HCC)   Right diabetic foot infection/abscess with osteomyelitis MRI right foot 07/22/2019 with open wound fifth metatarsal head with underlying centimeter abscess and osteomyelitis of the fourth and fifth metatarsal heads and proximal fifth phalanx with severe cellulitis/myofasciitis, probable septic arthritis fifth MTP joint.  Underwent right fifth amputation with wound VAC placement by Dr. Sharol Given on 07/25/2019.  No infection at the wound margins were noted. --Completed antibiotics 24h post-op with Zyvox, ceftriaxone, Flagyl  --continue doxycycline 100 mg p.o. twice daily x7 days after discussion with ID, Dr. Linus Salmons --Continue pain control with oxycodone 10mg  q4h prn for moderate pain and Dilaudid IV prn for severe breakthrough pain  Type 2 diabetes mellitus Hemoglobin A1c 6.4 07/24/2019, well controlled.  Diet controlled at home. --Insulin sliding scale for coverage  Hyperlipidemia: Continue atorvastatin 40 mg p.o. daily  ESRD on HD MWF --Nephrology following, appreciate assistance --HD planned for 3/15  Chronic diastolic congestive heart failure,  compensated TTE 02/27/2018 with preserved EF and grade 1 diastolic dysfunction. --Continue volume management with HD  Chronic pain syndrome --Continue home gabapentin 100 mg p.o. twice daily --Oxycodone 10 mg every 4 hours as needed --Dilaudid 0.5 mg every 4 hours IV for severe breakthrough pain --Colace 100mg  p.o. twice daily   DVT prophylaxis: Heparin Code Status: Full Code Family Communication: Updated patient extensively at bedside  Disposition Plan:  Status is: Inpatient  Remains inpatient appropriate because:Ongoing active pain requiring inpatient pain management, Unsafe d/c plan, IV treatments appropriate due to intensity of illness or inability to take PO and Inpatient level of care appropriate due to severity of illness   Dispo: The patient is from: Home              Anticipated d/c is to: Home with home health PT/OT on 07/28/2019              Anticipated d/c date is: 1 day 4/19              Patient currently is not medically stable to d/c.   Consultants:   Orthopedics - Dr. 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  Continues to complain of pins-and-needles sensation to his right foot plantar surface.  No other complaints or concerns at this time.  Denies headache, no chest pain, no palpitations, no shortness of breath, no abdominal pain, no fever/chills/night sweats, no nausea/vomiting/diarrhea.  No acute events overnight per nursing staff.  Objective: Vitals:   07/26/19 1644 07/26/19 2136 07/27/19 0015 07/27/19 0315  BP: 109/64 124/67 134/67 122/71  Pulse:  79 76 75 85  Resp:  16 16   Temp:  98.3 F (36.8 C) 98.5 F (36.9 C) 98.4 F (36.9 C)  TempSrc:  Oral Oral Oral  SpO2:  98% 98% 97%  Weight:      Height:        Intake/Output Summary (Last 24 hours) at 07/27/2019 1303 Last data filed  at 07/27/2019 0854 Gross per 24 hour  Intake 1360 ml  Output --  Net 1360 ml   Filed Weights   07/24/19 1033 07/25/19 0819 07/26/19 0904  Weight: 71.2 kg 71.2 kg 76.9 kg    Examination:  General exam: Appears calm and comfortable  Respiratory system: Clear to auscultation. Respiratory effort normal. Cardiovascular system: S1 & S2 heard, RRR. No JVD, murmurs, rubs, gallops or clicks. No pedal edema. Gastrointestinal system: Abdomen is nondistended, soft and nontender. No organomegaly or masses felt. Normal bowel sounds heard. Central nervous system: Alert and oriented. No focal neurological deficits. Extremities: Symmetric 5 x 5 power.  Noted right foot with wound VAC in place. Skin: No rashes, lesions or ulcers Psychiatry: Judgement and insight appear normal. Mood & affect appropriate.     Data Reviewed: I have personally reviewed following labs and imaging studies  CBC: Recent Labs  Lab 07/24/19 1042 07/25/19 1107  WBC 5.5 5.0  NEUTROABS 2.4  --   HGB 12.3* 11.5*  HCT 40.2 36.8*  MCV 92.0 92.9  PLT 259 762   Basic Metabolic Panel: Recent Labs  Lab 07/24/19 1042 07/25/19 1107  NA 135 133*  K 3.8 4.2  CL 91* 91*  CO2 30 31  GLUCOSE 127* 94  BUN 22 30*  CREATININE 7.20* 9.05*  CALCIUM 9.2 8.6*   GFR: Estimated Creatinine Clearance: 8.2 mL/min (A) (by C-G formula based on SCr of 9.05 mg/dL (H)). Liver Function Tests: Recent Labs  Lab 07/24/19 1042  AST 19  ALT 16  ALKPHOS 115  BILITOT 0.6  PROT 7.8  ALBUMIN 3.7   No results for input(s): LIPASE, AMYLASE in the last 168 hours. No results for input(s): AMMONIA in the last 168 hours. Coagulation Profile: Recent Labs  Lab 07/24/19 1042  INR 1.0   Cardiac Enzymes: No results for input(s): CKTOTAL, CKMB, CKMBINDEX, TROPONINI in the last 168 hours. BNP (last 3 results) No results for input(s): PROBNP in the last 8760 hours. HbA1C: Recent Labs    07/24/19 1933  HGBA1C 6.4*   CBG: Recent Labs   Lab 07/26/19 0650 07/26/19 1648 07/26/19 2047 07/27/19 0731 07/27/19 1221  GLUCAP 79 128* 97 84 95   Lipid Profile: No results for input(s): CHOL, HDL, LDLCALC, TRIG, CHOLHDL, LDLDIRECT in the last 72 hours. Thyroid Function Tests: No results for input(s): TSH, T4TOTAL, FREET4, T3FREE, THYROIDAB in the last 72 hours. Anemia Panel: No results for input(s): VITAMINB12, FOLATE, FERRITIN, TIBC, IRON, RETICCTPCT in the last 72 hours. Sepsis Labs: Recent Labs  Lab 07/24/19 1043 07/24/19 1255  LATICACIDVEN 1.4 1.2    Recent Results (from the past 240 hour(s))  Culture, blood (Routine x 2)     Status: None (Preliminary result)   Collection Time: 07/24/19 10:40 AM   Specimen: BLOOD  Result Value Ref Range Status   Specimen Description BLOOD RIGHT ANTECUBITAL  Final   Special Requests   Final    BOTTLES DRAWN AEROBIC AND ANAEROBIC Blood Culture adequate volume   Culture   Final    NO GROWTH 3 DAYS Performed at [facility], [address] [place],  [place] [address]    Report Status PENDING  Incomplete  Culture, blood (Routine x 2)     Status: None (Preliminary result)   Collection Time: 07/24/19 12:40 PM   Specimen: BLOOD RIGHT ARM  Result Value Ref Range Status   Specimen Description BLOOD RIGHT ARM  Final   Special Requests   Final    BOTTLES DRAWN AEROBIC AND ANAEROBIC Blood Culture adequate volume   Culture   Final    NO GROWTH 3 DAYS Performed at [facility], [address] [place], [place] [address]    Report Status PENDING  Incomplete  SARS CORONAVIRUS 2 (TAT 6-24 HRS) Nasopharyngeal Nasopharyngeal Swab     Status: None   Collection Time: 07/24/19  1:10 PM   Specimen: Nasopharyngeal Swab  Result Value Ref Range Status   SARS Coronavirus 2 NEGATIVE NEGATIVE Final    Comment: (NOTE) SARS-CoV-2 target nucleic acids are NOT DETECTED. The SARS-CoV-2 RNA is generally detectable in upper and lower respiratory specimens during the acute phase of infection.  Negative results do not preclude SARS-CoV-2 infection, do not rule out co-infections with other pathogens, and should not be used as the sole basis for treatment or other patient management decisions. Negative results must be combined with clinical observations, patient history, and epidemiological information. The expected result is Negative. Fact Sheet for Patients: SugarRoll.be Fact Sheet for Healthcare Providers: https://www.woods-mathews.com/ This test is not yet approved or cleared by the [place] FDA and  has been authorized for detection and/or diagnosis of SARS-CoV-2 by FDA under an Emergency Use Authorization (EUA). This EUA will remain  in effect (meaning this test can be used) for the duration of the COVID-19 declaration under Section 56 4(b)(1) of the Act, 21 U.S.C. section 360bbb-3(b)(1), unless the authorization is terminated or revoked sooner. Performed at [facility], [place] [address], [place], [place] [address]   Surgical pcr screen     Status: None   Collection Time: 07/24/19 11:17 PM   Specimen: Nasal Mucosa; Nasal Swab  Result Value Ref Range Status   MRSA, PCR NEGATIVE NEGATIVE Final   Staphylococcus aureus NEGATIVE NEGATIVE Final    Comment: (NOTE) The Xpert SA Assay (FDA approved for NASAL specimens in patients 18 years of age and older), is one component of a comprehensive surveillance program. It is not intended to diagnose infection nor to guide or monitor treatment. Performed at [facility], [place] [address], [place], [place] [address]          Radiology Studies: No results found.      Scheduled Meds: . atorvastatin  40 mg Oral q1800  . [START ON 07/28/2019] calcitRIOL  0.5 mcg Oral Once per day on Mon Wed Fri  . calcium acetate  1,334 mg Oral BID  . Chlorhexidine Gluconate Cloth  6 each Topical Q0600  . docusate sodium  100 mg Oral BID  . doxycycline  100 mg Oral Q12H  . feeding  supplement (PRO-STAT SUGAR FREE 64)  30 mL Oral TID  . gabapentin  100 mg Oral BID  . heparin  5,000 Units Subcutaneous Q8H  . insulin aspart  0-6 Units Subcutaneous TID WC  . multivitamin  1 tablet Oral QHS  . pantoprazole  40 mg Oral Daily  . sucralfate  1 g Oral BID  . traMADol  50 mg Oral TID   Continuous Infusions:    LOS: 3 days    Time spent: 32 minutes spent on chart review, discussion with  nursing staff, consultants, updating family and interview/physical exam; more than 50% of that time was spent in counseling and/or coordination of care.    Abdullah Rizzi J [place] DO [facility] Available via Epic secure chat 7am-7pm After these hours, please refer to coverage provider listed on [email or web address] 07/27/2019, 1:03 PM

## 2019-07-27 NOTE — Evaluation (Signed)
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 PMH including but not limited to DM, not on medication; OSA; PAD s/p stent; HLD; HTN; ESRD on MWF HD; and CAD s/p CABG.   Clinical Impression   This 69 yo male admitted and underwent above presents to acute OT with PLOF of Mod I to independent due to right foot. Currently pt is min A-min guard A for all basic ADLs and reports his wife can A him prn. He is concerned about getting from car to front stoop of house due to walkway is pebbles. We discussed could he drive car up to front porch (or close) and he said yes. This way he may only have 1-2 steps(hops) to get to front stoop instead of 20 feet. We will continue to follow acutely without need for follow up.    Follow Up Recommendations  No OT follow up;Supervision/Assistance - 24 hour    Equipment Recommendations  None recommended by OT       Precautions / Restrictions Precautions Precautions: Fall Precaution Comments: wound VAC, post op shoe Restrictions Weight Bearing Restrictions: Yes RLE Weight Bearing: Touchdown weight bearing      Mobility Bed Mobility Overal bed mobility: Modified Independent             General bed mobility comments: increasesd time  Transfers Overall transfer level: Needs assistance Equipment used: Rolling walker (2 wheeled) Transfers: Sit to/from Stand   Stand pivot transfers: Min guard            Balance Overall balance assessment: Needs assistance Sitting-balance support: No upper extremity supported;Feet supported Sitting balance-Leahy Scale: Good     Standing balance support: Single extremity supported Standing balance-Leahy Scale: Poor                             ADL either performed or assessed with clinical judgement   ADL Overall  ADL's : Needs assistance/impaired Eating/Feeding: Independent;Sitting   Grooming: Set up;Sitting   Upper Body Bathing: Set up;Sitting   Lower Body Bathing: Minimal assistance Lower Body Bathing Details (indicate cue type and reason): min guard A sit<>stand Upper Body Dressing : Set up;Sitting   Lower Body Dressing: Minimal assistance Lower Body Dressing Details (indicate cue type and reason): min guard A sit<>stand Toilet Transfer: Min guard;Ambulation;RW   Toileting- Clothing Manipulation and Hygiene: Min guard Toileting - Clothing Manipulation Details (indicate cue type and reason): min guard A sit<>stand             Vision Patient Visual Report: No change from baseline              Pertinent Vitals/Pain Pain Assessment: Faces Faces Pain Scale: Hurts even more Pain Location: R foot Pain Descriptors / Indicators: Throbbing(in dependent position) Pain Intervention(s): Limited activity within patient's tolerance;Monitored during session     Hand Dominance Right   Extremity/Trunk Assessment Upper Extremity Assessment Upper Extremity Assessment: Overall WFL for tasks assessed           Communication Communication Communication: No difficulties   Cognition Arousal/Alertness: Awake/alert Behavior During Therapy: WFL for tasks assessed/performed Overall Cognitive Status: Within Functional Limits for tasks assessed  Home Living Family/patient expects to be discharged to:: Private residence Living Arrangements: Spouse/significant other;Children Available Help at Discharge: Family;Available 24 hours/day Type of Home: House Home Access: Stairs to enter [employer] of Steps: 1 and 1 into house Entrance Stairs-Rails: None Home Layout: One level     Bathroom Shower/Tub: Tub/shower unit;Curtain   Bathroom Toilet: Handicapped height     Home Equipment: Environmental consultant - 2 wheels;Cane - single  point;Bedside commode          Prior Functioning/Environment Level of Independence: Independent with assistive device(s)        Comments: ambulates with use of a cane        OT Problem List: Decreased range of motion;Impaired balance (sitting and/or standing);Pain      OT Treatment/Interventions: Self-care/ADL training;DME and/or AE instruction;Patient/family education;Balance training    OT Goals(Current goals can be found in the care plan section) Acute Rehab OT Goals Patient Stated Goal: to go home probaby tomorrow or Tuesday OT Goal Formulation: With patient Time For Goal Achievement: 08/10/19 Potential to Achieve Goals: Good  OT Frequency: Min 2X/week              AM-PAC OT "6 Clicks" Daily Activity     Outcome Measure Help from another person eating meals?: None Help from another person taking care of personal grooming?: A Little Help from another person toileting, which includes using toliet, bedpan, or urinal?: A Little Help from another person bathing (including washing, rinsing, drying)?: A Little Help from another person to put on and taking off regular upper body clothing?: A Little Help from another person to put on and taking off regular lower body clothing?: A Little 6 Click Score: 19   End of Session Equipment Utilized During Treatment: Gait belt;Rolling walker Nurse Communication: (Wound vac unplugged and not on, I turned it back on--but unsure what to do from there.)  Activity Tolerance: Patient tolerated treatment well Patient left: in chair;with call bell/phone within reach;with chair alarm set  OT Visit Diagnosis: Unsteadiness on feet (R26.81);Other abnormalities of gait and mobility (R26.89);Muscle weakness (generalized) (M62.81);Pain Pain - Right/Left: Right Pain - part of body: Ankle and joints of foot                Time: 1007-1036 OT Time Calculation (min): 29 min Charges:  OT General Charges $OT Visit: 1 Visit OT Evaluation $OT Eval  Moderate Complexity: 1 Mod OT Treatments $Self Care/Home Management : 8-22 mins  Golden Circle, OTR/L Acute [employer] Pager [phone] Office [phone]     Almon Register 07/27/2019, 10:50 AM

## 2019-07-27 NOTE — Plan of Care (Signed)
  Problem: Activity: Goal: Risk for activity intolerance will decrease Outcome: Progressing   

## 2019-07-27 NOTE — Progress Notes (Addendum)
[facility] Progress Note   Subjective:   Seen in room - sitting in recliner. Ongoing foot pain, no CP/dyspnea. Dialyzed yesterday as roll-over from Friday - did fine.  Objective Vitals:   07/26/19 1644 07/26/19 2136 07/27/19 0015 07/27/19 0315  BP: 109/64 124/67 134/67 122/71  Pulse: 79 76 75 85  Resp:  16 16   Temp:  98.3 F (36.8 C) 98.5 F (36.9 C) 98.4 F (36.9 C)  TempSrc:  Oral Oral Oral  SpO2:  98% 98% 97%  Weight:      Height:       Physical Exam General: Well appearing man, NAD Heart: RRR; no murmur Lungs: CTAB Abdomen: soft Extremities: R foot with wound vac, no LE edmea Dialysis Access: AVF + thrill  Additional Objective Labs: Basic Metabolic Panel: Recent Labs  Lab 07/24/19 1042 07/25/19 1107  NA 135 133*  K 3.8 4.2  CL 91* 91*  CO2 30 31  GLUCOSE 127* 94  BUN 22 30*  CREATININE 7.20* 9.05*  CALCIUM 9.2 8.6*   Liver Function Tests: Recent Labs  Lab 07/24/19 1042  AST 19  ALT 16  ALKPHOS 115  BILITOT 0.6  PROT 7.8  ALBUMIN 3.7   CBC: Recent Labs  Lab 07/24/19 1042 07/25/19 1107  WBC 5.5 5.0  NEUTROABS 2.4  --   HGB 12.3* 11.5*  HCT 40.2 36.8*  MCV 92.0 92.9  PLT 259 235   Blood Culture    Component Value Date/Time   SDES BLOOD RIGHT ARM 07/24/2019 1240   SPECREQUEST  07/24/2019 1240    BOTTLES DRAWN AEROBIC AND ANAEROBIC Blood Culture adequate volume   CULT  07/24/2019 1240    NO GROWTH 3 DAYS Performed at [facility], [place] [address], [place], [place] [address]    REPTSTATUS PENDING 07/24/2019 1240   Medications:  . atorvastatin  40 mg Oral q1800  . calcium acetate  1,334 mg Oral BID  . Chlorhexidine Gluconate Cloth  6 each Topical Q0600  . docusate sodium  100 mg Oral BID  . doxycycline  100 mg Oral Q12H  . feeding supplement (PRO-STAT SUGAR FREE 64)  30 mL Oral TID  . gabapentin  100 mg Oral BID  . heparin  5,000 Units Subcutaneous Q8H  . insulin aspart  0-6 Units Subcutaneous TID WC  .  multivitamin  1 tablet Oral QHS  . pantoprazole  40 mg Oral Daily  . sucralfate  1 g Oral BID  . traMADol  50 mg Oral TID    Dialysis Orders: MWF at [facility] 4hr, 450/800, EDW 71.5kg, 2K/2Ca, UFP#2, AVF, heparin 5000 - Parsabiv 2.5mg  IV q HD - Mircera 58mcg IV q 2 weeks (last 4/2) - Calcitriol 0.35mcg PO q HD  Assessment/Plan: 1. 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 Previously on Linezolid + Flagyl - > now changed to PO doxycycline. 2. ESRD:Continue HD per MWF schedule - next HD 4/19. 3. Hypertension/volume:Stable, UF as tolerated. 4. Anemiaof ESRD:Hgb 12.3 --> 11.5. no ESA for now, monitor. 5. Metabolic bone disease:Ca ok, continue home binders. 6. Nutrition: Continue Pro-stat supplements for wound healing. 7. CAD - stable 8. T2DM - per primary svc  Veneta Penton, PA-C 07/27/2019, 11:21 AM  [facility]  I have seen and examined this patient and agree with plan and assessment in the above note with renal recommendations/intervention highlighted.  Resting comfortably but after waking him up he complained of throbbing foot pain.  Will  get back on outpatient schedule tomorrow. [place] John A Joseangel Nettleton,MD 07/27/2019 12:11 PM

## 2019-07-27 NOTE — Plan of Care (Signed)

## 2019-07-28 LAB — GLUCOSE, CAPILLARY
Glucose-Capillary: 84 mg/dL (ref 70–99)
Glucose-Capillary: 84 mg/dL (ref 70–99)
Glucose-Capillary: 91 mg/dL (ref 70–99)

## 2019-07-28 MED ORDER — SENNOSIDES-DOCUSATE SODIUM 8.6-50 MG PO TABS: 2.0000 | ORAL_TABLET | Freq: Two times a day (BID) | ORAL | 1 refills | Status: DC | PRN

## 2019-07-28 MED ORDER — POLYETHYLENE GLYCOL 3350 17 G PO PACK
17.0000 g | PACK | Freq: Once | ORAL | Status: AC
  Administered 2019-07-28: 17 g via ORAL
  Filled 2019-07-28: qty 1

## 2019-07-28 MED ORDER — TRAMADOL HCL 50 MG PO TABS: 50.0000 mg | ORAL_TABLET | Freq: Three times a day (TID) | ORAL | 0 refills | Status: AC

## 2019-07-28 MED ORDER — OXYCODONE HCL 5 MG PO TABS: 10.0000 mg | ORAL_TABLET | ORAL | 0 refills | Status: AC | PRN

## 2019-07-28 MED ORDER — DOXYCYCLINE HYCLATE 100 MG PO TABS: 100.0000 mg | ORAL_TABLET | Freq: Two times a day (BID) | ORAL | 0 refills | Status: AC

## 2019-07-28 NOTE — Progress Notes (Signed)
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 PMH including but not limited to DM, not on medication; OSA; PAD s/p stent; HLD; HTN; ESRD on MWF HD; and CAD s/p CABG.   OT comments  Pt continues to progress toward established OT goals. Prior to OT arrival, pt had just finished bathing after setupA from NT at bed level. Pt required minA to stand from lower surface and minguard for in room ambulation. He required minguard-minA for ADL completed in standing. Pt able to maintain TDWB status throughout session. Pt attempted to have BM but was unsuccessful. Continue to recommend d/c home with available support from family. Will continue to follow acutely.    Follow Up Recommendations  No OT follow up;Supervision/Assistance - 24 hour    Equipment Recommendations  None recommended by OT    Recommendations for Other Services      Precautions / Restrictions Precautions Precautions: Fall Precaution Comments: wound VAC, post op shoe Restrictions Weight Bearing Restrictions: Yes RLE Weight Bearing: Touchdown weight bearing       Mobility Bed Mobility Overal bed mobility: Modified Independent Bed Mobility: Supine to Sit              Transfers Overall transfer level: Needs assistance Equipment used: Rolling walker (2 wheeled) Transfers: Sit to/from Stand Sit to Stand: Min assist Stand pivot transfers: Min guard       General transfer comment: minA to progress into standing from lower surface and minA to control descent onto lower surface    Balance Overall balance assessment: Needs assistance Sitting-balance support: No upper extremity supported;Feet supported Sitting balance-Leahy Scale: Good     Standing balance support: Single extremity supported Standing balance-Leahy Scale: Poor                              ADL either performed or assessed with clinical judgement   ADL Overall ADL's : Needs assistance/impaired     Grooming: Set up;Sitting   Upper Body Bathing: Set up;Sitting Upper Body Bathing Details (indicate cue type and reason): pt completed UB bathing after setup at bed level prior to OT arrival Lower Body Bathing: Minimal assistance;Sit to/from stand Lower Body Bathing Details (indicate cue type and reason): minA to stand from lower surface     Lower Body Dressing: Minimal assistance Lower Body Dressing Details (indicate cue type and reason): minA to stand from lower surface Toilet Transfer: Minimal assistance;RW Toilet Transfer Details (indicate cue type and reason): minA to lower onto regular height commode Toileting- Clothing Manipulation and Hygiene: Minimal assistance;Sit to/from stand Toileting - Clothing Manipulation Details (indicate cue type and reason): minA to stand from standard commode     Functional mobility during ADLs: Rolling walker;Min guard;Minimal assistance General ADL Comments: minguard for in room mobility, minA for transfers to/from lower surface     Vision       Perception     Praxis      Cognition Arousal/Alertness: Awake/alert Behavior During Therapy: WFL for tasks assessed/performed Overall Cognitive Status: Within Functional Limits for tasks assessed                                          Exercises  Shoulder Instructions       General Comments      Pertinent Vitals/ Pain       Pain Assessment: 0-10 Pain Score: 5  Faces Pain Scale: Hurts even more Pain Location: R foot Pain Descriptors / Indicators: Throbbing(in dependent position) Pain Intervention(s): Limited activity within patient's tolerance;Monitored during session  Home Living                                          Prior Functioning/Environment              Frequency  Min 2X/week         Progress Toward Goals  OT Goals(current goals can now be found in the care plan section)  Progress towards OT goals: Progressing toward goals  Acute Rehab OT Goals Patient Stated Goal: to go home probaby tomorrow or Tuesday OT Goal Formulation: With patient Time For Goal Achievement: 08/10/19 Potential to Achieve Goals: Good ADL Goals Pt Will Perform Grooming: standing;with modified independence Pt Will Perform Lower Body Bathing: sit to/from stand;with modified independence Pt Will Perform Lower Body Dressing: with modified independence;sit to/from stand Pt Will Transfer to Toilet: with modified independence;ambulating;grab bars(comfort height toilet) Pt Will Perform Toileting - Clothing Manipulation and hygiene: with modified independence;sit to/from stand  Plan Discharge plan remains appropriate    Co-evaluation                 AM-PAC OT "6 Clicks" Daily Activity     Outcome Measure   Help from another person eating meals?: None Help from another person taking care of personal grooming?: A Little Help from another person toileting, which includes using toliet, bedpan, or urinal?: A Little Help from another person bathing (including washing, rinsing, drying)?: A Little Help from another person to put on and taking off regular upper body clothing?: A Little Help from another person to put on and taking off regular lower body clothing?: A Little 6 Click Score: 19    End of Session Equipment Utilized During Treatment: Gait belt;Rolling walker  OT Visit Diagnosis: Unsteadiness on feet (R26.81);Other abnormalities of gait and mobility (R26.89);Muscle weakness (generalized) (M62.81);Pain Pain - Right/Left: Right Pain - part of body: Ankle and joints of foot   Activity Tolerance Patient tolerated treatment well   Patient Left in chair;with call bell/phone within reach;with chair alarm set   Nurse Communication Mobility status        Time: 3435-6861 OT  Time Calculation (min): 28 min  Charges: OT General Charges $OT Visit: 1 Visit OT Treatments $Self Care/Home Management : 23-37 mins  Helene Kelp OTR/L Acute [facility] Office: [place] 07/28/2019, 12:54 PM

## 2019-07-28 NOTE — Progress Notes (Signed)
Patient has been waiting for dialysis, spoke to Juanell Fairly with the dialysis center here at [facility], stated they have had a couple emergencies come up today.  Aware patient is discharged pending dialysis, Velva Harman arranged for patient to have dialysis tomorrow at his home Dialysis center, [facility] at [phone] for chair time @ 0700.  His labs are okay for tonight, he is to go again on Wednesday and then will be back on his regular schedule.  Information given to patient's Nurse, Louie Casa.

## 2019-07-28 NOTE — Progress Notes (Signed)
Patient ID: Jon Anderson, male   DOB: [birth year], 69 y.o.   MRN: [id number] Patient is postoperative day 3 right foot fifth ray amputation.  There is no drainage in the wound VAC canister patient is planning for dialysis this morning.  Anticipate from an orthopedic standpoint he could discharge to home this afternoon with the portable Praveena wound VAC pump.  Follow-up in the office in 1 week.

## 2019-07-28 NOTE — Progress Notes (Signed)
Orthopedic Tech Progress Note Patient Details:  Jon Anderson July 27, 1950 [id number]  Ortho Devices Type of Ortho Device: Darco shoe Ortho Device/Splint Location: RLE Ortho Device/Splint Interventions: Ordered, Application   Post Interventions Patient Tolerated: Well Instructions Provided: Care of device   [name] 07/28/2019, 12:51 PM

## 2019-07-28 NOTE — Discharge Summary (Signed)
Physician Discharge Summary  WILMON CONOVER STM:[id number] DOB: [birth year] DOA: 07/24/2019  PCP: [name], MD  Admit date: 07/24/2019 Discharge date: 07/28/2019  Admitted From:  Disposition:    Recommendations for Outpatient Follow-up:  1. Follow up with PCP in 1-2 weeks 2. Follow-up with orthopedics, Dr. Sharol Given in one week 3. Continue antibiotics with doxycycline 100 mg p.o. twice daily to complete a 7-day course  Home Health: Yes, PT/OT Equipment/Devices: Rolling walker  Discharge Condition: Stable CODE STATUS: Full code Diet recommendation: Renal diet, consistent carbohydrate  History of present illness:  Jon Anderson is a 69 year old male with past medical history of type 2 diabetes mellitus, ESRD on HD, peripheral vascular disease, chronic pain syndrome, HLD who was admitted for right foot diabetic infection/osteomyelitis.  He was seen by orthopedics and underwent fifth ray amputation.  Hospital course:  Right diabetic foot infection/abscess with osteomyelitis MRI right foot 07/22/2019 with open wound fifth metatarsal head with underlying centimeter abscess and osteomyelitis of the fourth and fifth metatarsal heads and proximal fifth phalanx with severe cellulitis/myofasciitis, probable septic arthritis fifth MTP joint.  Underwent right fifth amputation with wound VAC placement by Dr. Sharol Given on 07/25/2019.  No infection at the wound margins were noted.  Completed IV antibiotics for 24 hours postoperative with Zyvox, ceftriaxone and Flagyl.  After discussion with ID, Dr. Linus Salmons; recommend continue antibiotics for 7 days postoperatively with doxycycline 100 mg p.o. twice daily.  Continue pain control with tramadol and oxycodone as needed.  Continue wound VAC following discharge.  Outpatient follow-up with orthopedics, Dr. Sharol Given in 1 week.  Type 2 diabetes mellitus Hemoglobin A1c 6.4 07/24/2019, well controlled.  Diet controlled at home.  Hyperlipidemia: Continue atorvastatin  40 mg p.o. daily  ESRD on HD MWF Continued hemodialysis while inpatient.  Resume regular dialysis at home unit following discharge.  Chronic diastolic congestive heart failure, compensated TTE 02/27/2018 with preserved EF and grade 1 diastolic dysfunction. Continue volume management with HD  Chronic pain syndrome Continue home gabapentin 100 mg p.o. twice daily, Tramadol 50mg  PO TID prn for pain and Oxycodone 10 mg every 4 hours as needed for severe breakthrough pain.  Senokot 2 tabs twice daily as needed to avoid constipation.  Discharge Diagnoses:  Principal Problem:   Diabetic foot infection ([place]) Active Problems:   Hyperlipidemia LDL goal <70   Essential hypertension   PVD (peripheral vascular disease) ([place])   ESRD (end stage renal disease) on dialysis ([place])   Type 2 diabetes mellitus with diabetic peripheral angiopathy without gangrene (H[facility])   Chronic diastolic CHF (congestive heart failure) ([facility])   Osteomyelitis of right foot [facility])    Discharge Instructions  Discharge Instructions    Call MD for:  difficulty breathing, headache or visual disturbances   Complete by: As directed    Call MD for:  extreme fatigue   Complete by: As directed    Call MD for:  persistant dizziness or light-headedness   Complete by: As directed    Call MD for:  persistant nausea and vomiting   Complete by: As directed    Call MD for:  severe uncontrolled pain   Complete by: As directed    Call MD for:  temperature >100.4   Complete by: As directed    Diet - low sodium heart healthy   Complete by: As directed    Increase activity slowly   Complete by: As directed    Negative Pressure Wound Therapy - Incisional   Complete by: As directed  Show patient how to attach prevena pump     Allergies as of 07/28/2019      Reactions   Kiwi Extract Itching, Swelling, Other (See Comments)   Lips and face swell- breathing not affected   Flexeril [cyclobenzaprine]    Hands become flimsy, can  not hold things   Tape Other (See Comments)   "Plastic" tape causes blisters!!      Medication List    STOP taking these medications   mupirocin ointment 2 % Commonly known as: BACTROBAN   Santyl ointment Generic drug: collagenase     TAKE these medications   acetaminophen 500 MG tablet Commonly known as: TYLENOL Take 1,000 mg by mouth every 4 (four) hours as needed for moderate pain or headache.   aspirin EC 81 MG tablet Take 1 tablet (81 mg total) by mouth daily.   atorvastatin 40 MG tablet Commonly known as: LIPITOR Take 1 tablet (40 mg total) by mouth daily at 6 PM.   calcium acetate 667 MG capsule Commonly known as: PHOSLO Take 3 capsules (2,001 mg total) by mouth 3 (three) times daily with meals. What changed:   how much to take  when to take this   clopidogrel 75 MG tablet Commonly known as: PLAVIX Take 75 mg by mouth daily.   clotrimazole 1 % cream Commonly known as: LOTRIMIN Apply between toes and under toes twice daily for 4 weeks. What changed:   how much to take  how to take this  when to take this   doxycycline 100 MG tablet Commonly known as: VIBRA-TABS Take 1 tablet (100 mg total) by mouth every 12 (twelve) hours for 6 days.   gabapentin 100 MG capsule Commonly known as: NEURONTIN Take 100 mg by mouth 2 (two) times daily.   multivitamin Tabs tablet Take 1 tablet by mouth at bedtime.   nitroGLYCERIN 0.4 MG SL tablet Commonly known as: Nitrostat Place 1 tablet (0.4 mg total) under the tongue every 5 (five) minutes as needed for chest pain.   OneTouch Ultra test strip Generic drug: glucose blood   oxyCODONE 5 MG immediate release tablet Commonly known as: Oxy IR/ROXICODONE Take 2 tablets (10 mg total) by mouth every 4 (four) hours as needed for up to 7 days for severe pain (severe post operative pain not relieved with tramadol). What changed: reasons to take this   pantoprazole 40 MG tablet Commonly known as: PROTONIX Take 40 mg  by mouth daily.   polyethylene glycol 17 g packet Commonly known as: MIRALAX / GLYCOLAX Take 17 g by mouth daily as needed for moderate constipation.   senna-docusate 8.6-50 MG tablet Commonly known as: Senokot-S Take 2 tablets by mouth 2 (two) times daily as needed for mild constipation.   sucralfate 1 g tablet Commonly known as: CARAFATE Take 1 g by mouth 2 (two) times daily.   traMADol 50 MG tablet Commonly known as: ULTRAM Take 1 tablet (50 mg total) by mouth 3 (three) times daily for 7 days. What changed: when to take this      Follow-up Information    Newt Minion, MD In 1 week.   Specialty: Orthopedic Surgery Contact information: [place] [address] [phone]        [name], MD. Call in 1 week(s).   Specialty: Internal Medicine Contact information: [address] [place] [address] [phone]        Lorretta Harp, MD .   Specialties: Cardiology, Radiology Contact information: [address]  [place] [address] [phone]          Allergies  Allergen Reactions  . Kiwi Extract Itching, Swelling and Other (See Comments)    Lips and face swell- breathing not affected  . Flexeril [Cyclobenzaprine]     Hands become flimsy, can not hold things  . Tape Other (See Comments)    "Plastic" tape causes blisters!!    Consultations:  Orthopedics - Dr. Sharol Given   Procedures/Studies: MR FOOT RIGHT W WO CONTRAST  Result Date: 07/23/2019 CLINICAL DATA:  Diabetic foot ulcer involving the lateral aspect of the foot near the fifth metatarsal head. EXAM: MRI OF THE RIGHT FOREFOOT WITHOUT AND WITH CONTRAST TECHNIQUE: Multiplanar, multisequence MR imaging of the right foot was performed before and after the administration of intravenous contrast. CONTRAST:  80mL GADAVIST GADOBUTROL 1 MMOL/ML IV SOLN COMPARISON:  Radiographs 06/12/2019 FINDINGS: There is an open wound noted along the lateral aspect of the forefoot at  the level of the fifth metatarsal head. There is an underlying rim enhancing abscess measuring 2.5 cm wrapping around the fifth metatarsal neck area. There is abnormal this signal intensity and enhancement in the fifth metatarsal head and neck and also in the proximal fifth phalanx consistent with osteomyelitis. Probable septic arthritis at the fifth MTP joint. There is also abnormal T2 signal intensity and enhancement in the fourth metatarsal head suspicious for osteomyelitis. Severe diffuse cellulitis and myofasciitis without definite findings for pyomyositis. IMPRESSION: 1. Open wound along the lateral aspect of the forefoot at the level of the fifth metatarsal head with an underlying 2.5 cm abscess. 2. 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 There is also osteomyelitis involving the fourth metatarsal head. 3. Probable septic arthritis at the fifth MTP joint. 4. Severe cellulitis and myofasciitis without definite findings for pyomyositis. Electronically Signed   By: Marijo Sanes M.D.   On: 07/23/2019 10:03   VAS US CAROTID  Result Date: 07/01/2019 Carotid Arterial Duplex Study Indications:       Carotid artery disease and Patient denies any cerebrovascular                    symptoms. Risk Factors:      Hypertension, hyperlipidemia, Diabetes, past history of                    smoking, coronary artery disease, PAD. Comparison Study:  In 06/2018, a carotid duplex showed a RICA velocity of 70/22                    cm/s and a LICA velocity of 353/29 cm/s. Performing Technologist: Wilkie Aye RVT  Examination Guidelines: A complete evaluation includes B-mode imaging, spectral Doppler, color Doppler, and power Doppler as needed of all accessible portions of each vessel. Bilateral testing is considered an integral part of a complete examination. Limited examinations for reoccurring indications may be performed as noted.  Right Carotid Findings:  +----------+--------+--------+--------+--------------------------+--------+           PSV cm/sEDV cm/sStenosisPlaque Description        Comments +----------+--------+--------+--------+--------------------------+--------+ CCA Prox  160     10                                                 +----------+--------+--------+--------+--------------------------+--------+ CCA Mid   87  13      <50%    diffuse and heterogenous           +----------+--------+--------+--------+--------------------------+--------+ CCA Distal93      17              diffuse and heterogenous           +----------+--------+--------+--------+--------------------------+--------+ ICA Prox  77      19      1-39%   heterogenous and irregular         +----------+--------+--------+--------+--------------------------+--------+ ICA Mid   85      30                                                 +----------+--------+--------+--------+--------------------------+--------+ ICA Distal97      31                                                 +----------+--------+--------+--------+--------------------------+--------+ ECA       175     8                                                  +----------+--------+--------+--------+--------------------------+--------+ +----------+--------+-------+----------------+-------------------+           PSV cm/sEDV cmsDescribe        Arm Pressure (mmHG) +----------+--------+-------+----------------+-------------------+ [id number]            Multiphasic, [id number]                 +----------+--------+-------+----------------+-------------------+ +---------+--------+--+--------+--+---------+ VertebralPSV cm/s51EDV cm/s15Antegrade +---------+--------+--+--------+--+---------+  Left Carotid Findings: +----------+--------+--------+--------+--------------------------+--------+           PSV cm/sEDV cm/sStenosisPlaque Description        Comments  +----------+--------+--------+--------+--------------------------+--------+ CCA Prox  78      3                                                  +----------+--------+--------+--------+--------------------------+--------+ CCA Mid   73      13      <50%    heterogenous and irregular         +----------+--------+--------+--------+--------------------------+--------+ CCA Distal68      18                                                 +----------+--------+--------+--------+--------------------------+--------+ ICA Prox  334     40      40-59%  heterogenous and irregular         +----------+--------+--------+--------+--------------------------+--------+ ICA Mid   82      15                                                 +----------+--------+--------+--------+--------------------------+--------+ ICA Distal57  15                                                 +----------+--------+--------+--------+--------------------------+--------+ ECA       303     1       >50%                                       +----------+--------+--------+--------+--------------------------+--------+ +----------+--------+--------+---------------------+-------------------+           PSV cm/sEDV cm/sDescribe             Arm Pressure (mmHG) +----------+--------+--------+---------------------+-------------------+ [id number]             AVF for dialysis. WNL                    +----------+--------+--------+---------------------+-------------------+ +---------+--------+--+--------+-+ VertebralPSV cm/s51EDV cm/s8 +---------+--------+--+--------+-+   Summary: Right Carotid: Velocities in the right ICA are consistent with a 1-39% stenosis.                Non-hemodynamically significant plaque <50% noted in the CCA. The                ECA appears >50% stenosed. Left Carotid: Velocities in the left ICA are consistent with a 40-59% stenosis.               Non-hemodynamically  significant plaque <50% noted in the CCA. The               ECA appears >50% stenosed. Vertebrals:  Bilateral vertebral arteries demonstrate antegrade flow. Subclavians: Normal flow hemodynamics were seen in bilateral subclavian              arteries. *See table(s) above for measurements and observations. Suggest follow up study in 12 months. Electronically signed by Larae Grooms MD on 07/01/2019 at 5:23:45 PM.    Final       Subjective: Patient seen and examined bedside, resting comfortably.  States pain is currently controlled.  Awaiting dialysis today then discharge home with outpatient follow-up with orthopedics.  Denies headache, no fever/chills/night sweats, no nausea/vomiting/diarrhea, no chest pain, no palpitations, no shortness of breath, no abdominal pain.  No acute events overnight per nursing staff.  Discharge Exam: Vitals:   07/28/19 0452 07/28/19 0723  BP: (!) 155/82 (!) 146/72  Pulse: 80 76  Resp: 16 16  Temp: 98.5 F (36.9 C) 98.2 F (36.8 C)  SpO2: 97% 98%   Vitals:   07/27/19 0315 07/27/19 2013 07/28/19 0452 07/28/19 0723  BP: 122/71 135/90 (!) 155/82 (!) 146/72  Pulse: 85 77 80 76  Resp:  16 16 16   Temp: 98.4 F (36.9 C) 98.6 F (37 C) 98.5 F (36.9 C) 98.2 F (36.8 C)  TempSrc: Oral Oral Oral Oral  SpO2: 97% 98% 97% 98%  Weight:      Height:        General: Pt is alert, awake, not in acute distress Cardiovascular: RRR, S1/S2 +, no rubs, no gallops Respiratory: CTA bilaterally, no wheezing, no rhonchi Abdominal: Soft, NT, ND, bowel sounds + Extremities: no edema, no cyanosis, right foot noted with wound VAC in place with minimal to no drainage in canister    The results of significant diagnostics from this hospitalization (including imaging, microbiology, ancillary and laboratory) are  listed below for reference.     Microbiology: Recent Results (from the past 240 hour(s))  Culture, blood (Routine x 2)     Status: None (Preliminary result)    Collection Time: 07/24/19 10:40 AM   Specimen: BLOOD  Result Value Ref Range Status   Specimen Description BLOOD RIGHT ANTECUBITAL  Final   Special Requests   Final    BOTTLES DRAWN AEROBIC AND ANAEROBIC Blood Culture adequate volume   Culture   Final    NO GROWTH 4 DAYS Performed at [facility], [address], [place], [place] [address]    Report Status PENDING  Incomplete  Culture, blood (Routine x 2)     Status: None (Preliminary result)   Collection Time: 07/24/19 12:40 PM   Specimen: BLOOD RIGHT ARM  Result Value Ref Range Status   Specimen Description BLOOD RIGHT ARM  Final   Special Requests   Final    BOTTLES DRAWN AEROBIC AND ANAEROBIC Blood Culture adequate volume   Culture   Final    NO GROWTH 4 DAYS Performed at [facility], [place] [address], [place], [place] [address]    Report Status PENDING  Incomplete  SARS CORONAVIRUS 2 (TAT 6-24 HRS) Nasopharyngeal Nasopharyngeal Swab     Status: None   Collection Time: 07/24/19  1:10 PM   Specimen: Nasopharyngeal Swab  Result Value Ref Range Status   SARS Coronavirus 2 NEGATIVE NEGATIVE Final    Comment: (NOTE) SARS-CoV-2 target nucleic acids are NOT DETECTED. The SARS-CoV-2 RNA is generally detectable in upper and lower respiratory specimens during the acute phase of infection. Negative results do not preclude SARS-CoV-2 infection, do not rule out co-infections with other pathogens, and should not be used as the sole basis for treatment or other patient management decisions. Negative results must be combined with clinical observations, patient history, and epidemiological information. The expected result is Negative. Fact Sheet for Patients: SugarRoll.be Fact Sheet for Healthcare Providers: https://www.woods-mathews.com/ This test is not yet approved or cleared by the [place] FDA and  has been authorized for detection and/or diagnosis of SARS-CoV-2 by FDA under  an Emergency Use Authorization (EUA). This EUA will remain  in effect (meaning this test can be used) for the duration of the COVID-19 declaration under Section 56 4(b)(1) of the Act, 21 U.S.C. section 360bbb-3(b)(1), unless the authorization is terminated or revoked sooner. Performed at [facility], [place] [address], [place], [place] [address]   Surgical pcr screen     Status: None   Collection Time: 07/24/19 11:17 PM   Specimen: Nasal Mucosa; Nasal Swab  Result Value Ref Range Status   MRSA, PCR NEGATIVE NEGATIVE Final   Staphylococcus aureus NEGATIVE NEGATIVE Final    Comment: (NOTE) The Xpert SA Assay (FDA approved for NASAL specimens in patients 1 years of age and older), is one component of a comprehensive surveillance program. It is not intended to diagnose infection nor to guide or monitor treatment. Performed at [facility], [place] [address], [place], [place] [address]      Labs: BNP (last 3 results) No results for input(s): BNP in the last 8760 hours. Basic Metabolic Panel: Recent Labs  Lab 07/24/19 1042 07/25/19 1107  NA 135 133*  K 3.8 4.2  CL 91* 91*  CO2 30 31  GLUCOSE 127* 94  BUN 22 30*  CREATININE 7.20* 9.05*  CALCIUM 9.2 8.6*   Liver Function Tests: Recent Labs  Lab 07/24/19 1042  AST  19  ALT 16  ALKPHOS 115  BILITOT 0.6  PROT 7.8  ALBUMIN 3.7   No results for input(s): LIPASE, AMYLASE in the last 168 hours. No results for input(s): AMMONIA in the last 168 hours. CBC: Recent Labs  Lab 07/24/19 1042 07/25/19 1107  WBC 5.5 5.0  NEUTROABS 2.4  --   HGB 12.3* 11.5*  HCT 40.2 36.8*  MCV 92.0 92.9  PLT 259 235   Cardiac Enzymes: No results for input(s): CKTOTAL, CKMB, CKMBINDEX, TROPONINI in the last 168 hours. BNP: Invalid input(s): POCBNP CBG: Recent Labs  Lab 07/27/19 0731 07/27/19 1221 07/27/19 1702 07/27/19 2112 07/28/19 0724  GLUCAP 84 95 121* 120* 84   D-Dimer No results for input(s): DDIMER in the last 72  hours. Hgb A1c No results for input(s): HGBA1C in the last 72 hours. Lipid Profile No results for input(s): CHOL, HDL, LDLCALC, TRIG, CHOLHDL, LDLDIRECT in the last 72 hours. Thyroid function studies No results for input(s): TSH, T4TOTAL, T3FREE, THYROIDAB in the last 72 hours.  Invalid input(s): FREET3 Anemia work up No results for input(s): VITAMINB12, FOLATE, FERRITIN, TIBC, IRON, RETICCTPCT in the last 72 hours. Urinalysis    Component Value Date/Time   COLORURINE YELLOW 03/18/2017 1721   APPEARANCEUR CLEAR 03/18/2017 1721   LABSPEC 1.007 03/18/2017 1721   PHURINE 9.0 (H) 03/18/2017 1721   GLUCOSEU 150 (A) 03/18/2017 1721   HGBUR NEGATIVE 03/18/2017 1721   BILIRUBINUR NEGATIVE 03/18/2017 1721   KETONESUR NEGATIVE 03/18/2017 1721   PROTEINUR >=300 (A) 03/18/2017 1721   UROBILINOGEN 0.2 06/18/2014 2308   NITRITE NEGATIVE 03/18/2017 1721   LEUKOCYTESUR NEGATIVE 03/18/2017 1721   Sepsis Labs Invalid input(s): PROCALCITONIN,  WBC,  LACTICIDVEN Microbiology Recent Results (from the past 240 hour(s))  Culture, blood (Routine x 2)     Status: None (Preliminary result)   Collection Time: 07/24/19 10:40 AM   Specimen: BLOOD  Result Value Ref Range Status   Specimen Description BLOOD RIGHT ANTECUBITAL  Final   Special Requests   Final    BOTTLES DRAWN AEROBIC AND ANAEROBIC Blood Culture adequate volume   Culture   Final    NO GROWTH 4 DAYS Performed at [facility], [place] [address] [place], [place] [address]    Report Status PENDING  Incomplete  Culture, blood (Routine x 2)     Status: None (Preliminary result)   Collection Time: 07/24/19 12:40 PM   Specimen: BLOOD RIGHT ARM  Result Value Ref Range Status   Specimen Description BLOOD RIGHT ARM  Final   Special Requests   Final    BOTTLES DRAWN AEROBIC AND ANAEROBIC Blood Culture adequate volume   Culture   Final    NO GROWTH 4 DAYS Performed at [facility], [place] [address] [place], [place] [address]     Report Status PENDING  Incomplete  SARS CORONAVIRUS 2 (TAT 6-24 HRS) Nasopharyngeal Nasopharyngeal Swab     Status: None   Collection Time: 07/24/19  1:10 PM   Specimen: Nasopharyngeal Swab  Result Value Ref Range Status   SARS Coronavirus 2 NEGATIVE NEGATIVE Final    Comment: (NOTE) SARS-CoV-2 target nucleic acids are NOT DETECTED. The SARS-CoV-2 RNA is generally detectable in upper and lower respiratory specimens during the acute phase of infection. Negative results do not preclude SARS-CoV-2 infection, do not rule out co-infections with other pathogens, and should not be used as the sole basis for treatment or other patient management decisions. Negative results must be combined with clinical observations,  patient history, and epidemiological information. The expected result is Negative. Fact Sheet for Patients: SugarRoll.be Fact Sheet for Healthcare Providers: https://www.woods-mathews.com/ This test is not yet approved or cleared by the [place] FDA and  has been authorized for detection and/or diagnosis of SARS-CoV-2 by FDA under an Emergency Use Authorization (EUA). This EUA will remain  in effect (meaning this test can be used) for the duration of the COVID-19 declaration under Section 56 4(b)(1) of the Act, 21 U.S.C. section 360bbb-3(b)(1), unless the authorization is terminated or revoked sooner. Performed at [facility], [place] [address] [place], [place] [address]   Surgical pcr screen     Status: None   Collection Time: 07/24/19 11:17 PM   Specimen: Nasal Mucosa; Nasal Swab  Result Value Ref Range Status   MRSA, PCR NEGATIVE NEGATIVE Final   Staphylococcus aureus NEGATIVE NEGATIVE Final    Comment: (NOTE) The Xpert SA Assay (FDA approved for NASAL specimens in patients 39 years of age and older), is one component of a comprehensive surveillance program. It is not intended to diagnose infection nor to guide or  monitor treatment. Performed at [facility], [place] [address] [place], [place] [address]      Time coordinating discharge: Over 30 minutes  SIGNED:   Jaxton Casale J [place] DO  [facility] 07/28/2019, 11:52 AM

## 2019-07-28 NOTE — Progress Notes (Signed)
[facility] Progress Note   Subjective: Up in chair. Going home today post HD. No C/Os except for that he hasn't had HD yet.   Objective Vitals:   07/27/19 0315 07/27/19 2013 07/28/19 0452 07/28/19 0723  BP: 122/71 135/90 (!) 155/82 (!) 146/72  Pulse: 85 77 80 76  Resp:  16 16 16   Temp: 98.4 F (36.9 C) 98.6 F (37 C) 98.5 F (36.9 C) 98.2 F (36.8 C)  TempSrc: Oral Oral Oral Oral  SpO2: 97% 98% 97% 98%  Weight:      Height:       Physical Exam General: Pleasant older male in NAD Heart: S1, S2, no  M/R/G Lungs: CTAB Abdomen: Active BS Extremities: no LE edema.  [place] R foot.  Dialysis Access: AVF +T/B    Additional Objective Labs: Basic Metabolic Panel: Recent Labs  Lab 07/24/19 1042 07/25/19 1107  NA 135 133*  K 3.8 4.2  CL 91* 91*  CO2 30 31  GLUCOSE 127* 94  BUN 22 30*  CREATININE 7.20* 9.05*  CALCIUM 9.2 8.6*   Liver Function Tests: Recent Labs  Lab 07/24/19 1042  AST 19  ALT 16  ALKPHOS 115  BILITOT 0.6  PROT 7.8  ALBUMIN 3.7   No results for input(s): LIPASE, AMYLASE in the last 168 hours. CBC: Recent Labs  Lab 07/24/19 1042 07/25/19 1107  WBC 5.5 5.0  NEUTROABS 2.4  --   HGB 12.3* 11.5*  HCT 40.2 36.8*  MCV 92.0 92.9  PLT 259 235   Blood Culture    Component Value Date/Time   SDES BLOOD RIGHT ARM 07/24/2019 1240   SPECREQUEST  07/24/2019 1240    BOTTLES DRAWN AEROBIC AND ANAEROBIC Blood Culture adequate volume   CULT  07/24/2019 1240    NO GROWTH 4 DAYS Performed at [facility], [place] [address], [place], [place] [address]    REPTSTATUS PENDING 07/24/2019 1240    Cardiac Enzymes: No results for input(s): CKTOTAL, CKMB, CKMBINDEX, TROPONINI in the last 168 hours. CBG: Recent Labs  Lab 07/27/19 0731 07/27/19 1221 07/27/19 1702 07/27/19 2112 07/28/19 0724  GLUCAP 84 95 121* 120* 84   Iron Studies: No results for input(s): IRON, TIBC, TRANSFERRIN, FERRITIN in the last 72  hours. @lablastinr3 @ Studies/Results: No results found. Medications:  . atorvastatin  40 mg Oral q1800  . calcitRIOL  0.5 mcg Oral Once per day on Mon Wed Fri  . calcium acetate  1,334 mg Oral BID  . Chlorhexidine Gluconate Cloth  6 each Topical Q0600  . docusate sodium  100 mg Oral BID  . doxycycline  100 mg Oral Q12H  . feeding supplement (PRO-STAT SUGAR FREE 64)  30 mL Oral TID  . gabapentin  100 mg Oral BID  . heparin  5,000 Units Subcutaneous Q8H  . insulin aspart  0-6 Units Subcutaneous TID WC  . multivitamin  1 tablet Oral QHS  . pantoprazole  40 mg Oral Daily  . sucralfate  1 g Oral BID  . traMADol  50 mg Oral TID     Dialysis Orders: MWF at [facility] 4hr, 450/800, EDW 71.5kg, 2K/2Ca, UFP#2, AVF, heparin 5000 units IV  - Parsabiv 2.5mg  IV q HD - Mircera 44mcg IV q 2 weeks (last 4/2) - Calcitriol 0.77mcg PO q HD  Assessment/Plan: 1. 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 Previously on Linezolid + Flagyl - > now changed to PO doxycycline. 2. ESRD:Continue HD per MWF schedule -  next HD today 3. Hypertension/volume:Stable, UF as tolerated. 4. Anemiaof ESRD:Hgb 12.3--> 11.5.no ESA for now, monitor. 5. Metabolic bone disease:Ca ok, continue home binders. 6. Nutrition: Continue Pro-stat supplements for wound healing. 7. CAD- stable 8. T2DM- per primary svc  DC home post HD today.   Melynda Krzywicki H. Maahi Lannan NP-C 07/28/2019, 12:12 PM  [employer] [phone]

## 2019-07-28 NOTE — TOC Transition Note (Signed)
Transition of Care [facility]) - CM/SW Discharge Note   Patient Details  Name: KIENAN DOUBLIN MRN: [id number] Date of Birth: [birth year]  Transition of Care [facility]) CM/SW Contact:  Jacquelynn Cree Phone Number: 07/28/2019, 3:54 PM   Clinical Narrative:    CSW confirmed Alvis Lemmings able to take patient for [facility]. CSW met bedside with patient and provided an update. No further questions at this time.   CSW will sign off for now as social work intervention is no longer needed. Please consult [place] again if new needs arise.   Final next level of care: [place] Barriers to Discharge: Continued Medical Work up   Patient Goals and CMS Choice Patient states their goals for this hospitalization and ongoing recovery are:: I believe I'll be going home tomorrow. CMS Medicare.gov Compare Post Acute Care list provided to:: Patient Choice offered to / list presented to : Patient  Discharge Placement                       Discharge Plan and Services                DME Arranged: (patient declined wheelchair, currently has RW, 3:1 and cane)       Representative spoke with at DME Agency: Joen Laura HH Arranged: PT, OT [place] Agency: [facility] [employer]) Date [place]: 07/25/19 Time [place]: 1627 Representative spoke with at [place]: Rafael Capo ([place]) Interventions     Readmission Risk Interventions No flowsheet data found.

## 2019-07-28 NOTE — TOC Progression Note (Addendum)
Transition of Care [facility] - Progression Note    Patient Details  Name: Jon Anderson MRN: [id number] Date of Birth: [birth year]  Transition of Care [facility]) CM/SW [place], [place] Phone Number: 07/28/2019, 2:20 PM  Clinical Narrative:    CSW followed up with [facility] for [facility] services. [facility] unable to accept patient at this time. [facility], [facility] and [facility] are unable to accept patient. CSW contacted [facility], waiting for follow-up. [place] [facility]) are reviewing. CSW continuing to contact agencies for [facility] services.       Barriers to Discharge: Continued Medical Work up  Expected Discharge Plan and Services           Expected Discharge Date: 07/28/19               DME Arranged: (patient declined wheelchair, currently has RW, 3:1 and cane)       Representative spoke with at DME Agency: [place]: PT, OT [place] Agency: [facility] [employer] Date [place]: 07/25/19 Time [place]: 1627 Representative spoke with at [place]: [place] ([place]) Interventions    Readmission Risk Interventions No flowsheet data found.

## 2019-07-28 NOTE — Progress Notes (Signed)
Renal Navigator receive call at 5:20pm from Renal NP that patient has been discharged today, but is still here awaiting inpt HD. Navigator contacted patient's OP HD clinic/[facility] and requested an appointment for tomorrow, 07/29/19. He has been given an appointment tomorrow at 7:00am. He needs to arrive at 6:40am for this appointment. Navigator informed Renal NP and asked to notify patient as Navigator has already left the building for the day.  Alphonzo Cruise, [place] Renal Navigator [phone]

## 2019-07-28 NOTE — Progress Notes (Signed)
Physical Therapy Treatment Patient Details Name: ODESSA MORREN MRN: [id number] DOB: [birth year] Today's Date: 07/28/2019    History of Present Illness Pt is a 69 y/o male s/p R 5th ray amputation secondary to osteomyelitis. PMH including but not limited to DM, not on medication; OSA; PAD s/p stent; HLD; HTN; ESRD on MWF HD; and CAD s/p CABG.    PT Comments    Session focused on progress to stair training.  Received clarification from PA about heel weight bearing.  He required weight bearing on his heel to climb x 2 stairs to simulate home entry.  He continues to benefit from HHPT and assistance from family at home.  He fatigues quickly with activity.      Follow Up Recommendations  Home health PT;Supervision for mobility/OOB     Equipment Recommendations       Recommendations for Other Services       Precautions / Restrictions Precautions Precautions: Fall Precaution Comments: wound VAC, post op shoe darco Restrictions Weight Bearing Restrictions: Yes RLE Weight Bearing: Touchdown weight bearing(can weight bear through heel if needed but PA reports to keep weight off if at all possible.)    Mobility  Bed Mobility Overal bed mobility: Modified Independent Bed Mobility: Supine to Sit           General bed mobility comments: Pt seated in recliner on arrival.  Transfers Overall transfer level: Needs assistance Equipment used: Rolling walker (2 wheeled) Transfers: Sit to/from Stand Sit to Stand: Min assist;Mod assist Stand pivot transfers: Min guard       General transfer comment: Pt required assistance to boost patient into standing.  Pt required assistance to lower to seated surface.  Ambulation/Gait Ambulation/Gait assistance: Min assist Gait Distance (Feet): 12 Feet(+ 10 ft) Assistive device: Rolling walker (2 wheeled) Gait Pattern/deviations: (hop to and intermittent heel weight bearing when fatigued.) Gait velocity: decreased   General Gait Details: Pt  required cues for keep as much weight off his foot as possible.  He was wearing a darco shoe this pm which limited weight bearing on toes.   Stairs Stairs: Yes Stairs assistance: Mod assist Stair Management: No rails;Backwards Number of Stairs: 2 General stair comments: Cues for sequencing and hand placement.  Pt required increased assistance to maintain stability of RW.  he did have weight bear through heel to climb stairs.   Wheelchair Mobility    Modified Rankin (Stroke Patients Only)       Balance Overall balance assessment: Needs assistance Sitting-balance support: No upper extremity supported;Feet supported Sitting balance-Leahy Scale: Good     Standing balance support: Single extremity supported Standing balance-Leahy Scale: Poor                              Cognition Arousal/Alertness: Awake/alert Behavior During Therapy: WFL for tasks assessed/performed;Flat affect Overall Cognitive Status: Within Functional Limits for tasks assessed                                        Exercises      General Comments        Pertinent Vitals/Pain Pain Assessment: 0-10 Pain Score: 5  Faces Pain Scale: Hurts even more Pain Location: R foot Pain Descriptors / Indicators: Throbbing Pain Intervention(s): Monitored during session;Repositioned    Home Living  Prior Function            PT Goals (current goals can now be found in the care plan section) Acute Rehab PT Goals Patient Stated Goal: To use the bathroom. Potential to Achieve Goals: Good Progress towards PT goals: Progressing toward goals    Frequency    Min 5X/week      PT Plan Current plan remains appropriate    Co-evaluation              AM-PAC PT "6 Clicks" Mobility   Outcome Measure  Help needed turning from your back to your side while in a flat bed without using bedrails?: None Help needed moving from lying on your back to  sitting on the side of a flat bed without using bedrails?: None Help needed moving to and from a bed to a chair (including a wheelchair)?: A Little Help needed standing up from a chair using your arms (e.g., wheelchair or bedside chair)?: A Lot Help needed to walk in hospital room?: A Lot Help needed climbing 3-5 steps with a railing? : A Lot 6 Click Score: 17    End of Session Equipment Utilized During Treatment: Gait belt Activity Tolerance: Patient tolerated treatment well Patient left: with call bell/phone within reach(on commode with instruction to pull string, RN and NT informed and NT in room when PTA left.) Nurse Communication: Mobility status(informed nurse and NT he was nauseous and sitting on commode to have BM,  NT in room when PTA left.) PT Visit Diagnosis: Other abnormalities of gait and mobility (R26.89)     Time: 1779-3903 PT Time Calculation (min) (ACUTE ONLY): 29 min  Charges:  $Gait Training: 8-22 mins $Therapeutic Activity: 8-22 mins                     [name] , PTA Acute Rehabilitation Services Pager [phone] Office [phone]     Glennis Borger Eli Hose 07/28/2019, 2:03 PM

## 2019-07-29 ENCOUNTER — Telehealth: Payer: Self-pay | Admitting: Nephrology

## 2019-07-29 LAB — CULTURE, BLOOD (ROUTINE X 2)
Culture: NO GROWTH
Culture: NO GROWTH
Special Requests: ADEQUATE
Special Requests: ADEQUATE

## 2019-07-29 NOTE — Telephone Encounter (Signed)
Transition of care contact from inpatient facility  Date of discharge: 07/28/19 Date of contact: 07/29/19 Method: Phone Spoke to: Patient's wife, Stanton Kidney   Patient contacted to discuss transition of care from recent inpatient hospitalization. Patient was admitted to [facility] from 4/15-4/19/21 with discharge diagnosis of diabetic foot infection with osteomyelitis  Medication changes were reviewed. Patient has picked up antibiotics.   Patient will follow up with his/her outpatient HD unit on: 07/30/19

## 2019-08-01 ENCOUNTER — Telehealth: Payer: Self-pay | Admitting: Orthopedic Surgery

## 2019-08-01 NOTE — Telephone Encounter (Signed)
Patient called stating that he called [facility] to set up HHPT per Dr. Sharol Given.  Alvis Lemmings advised the patient that they did not have a referral for him.  CB#[phone].  Thank you.

## 2019-08-01 NOTE — Telephone Encounter (Signed)
Please let patient know Not much PT can discuss at his first post op and forward info to Autumn

## 2019-08-01 NOTE — Telephone Encounter (Signed)
Can you please advise on HHPT? Patient had 5th ray amputation on 07/25/2019 and has not been seen in the office post op yet.

## 2019-08-01 NOTE — Telephone Encounter (Signed)
Holding for you per note from [facility].  I called patient and advised. His first post op visit is scheduled for 08/05/2019.

## 2019-08-05 ENCOUNTER — Ambulatory Visit (INDEPENDENT_AMBULATORY_CARE_PROVIDER_SITE_OTHER): Payer: Medicare Other | Admitting: Physician Assistant

## 2019-08-05 ENCOUNTER — Encounter: Payer: Self-pay | Admitting: Orthopedic Surgery

## 2019-08-05 ENCOUNTER — Other Ambulatory Visit: Payer: Self-pay

## 2019-08-05 VITALS — Ht 70.0 in | Wt 169.0 lb

## 2019-08-05 DIAGNOSIS — M86271 Subacute osteomyelitis, right ankle and foot: Secondary | ICD-10-CM

## 2019-08-05 NOTE — Progress Notes (Signed)
Office Visit Note   Patient: Jon Anderson           Date of Birth: [birth year]           MRN: [id number] Visit Date: 08/05/2019              Requested by: [name], MD [id number],  [place] [id number] PCP: [name], MD  Chief Complaint  Patient presents with  . Right Foot - Routine Post Op    07/25/19 right 5th ray amputation       HPI: This is a pleasant 69 year old gentleman who is 10 days status post right fifth ray amputation.  He has been in his postop shoe and trying to avoid weightbearing.  He was on doxycycline which he has completed.  He does report that it seemed to give him diarrhea.  Assessment & Plan: Visit Diagnoses: No diagnosis found.  Plan: With regards to his right foot we will follow up in 1 week.  He will do mild soap and water daily dressing changes.  He does have some swelling in his great toe but I cannot appreciate any erythema or drainage.  Hopefully this will improve as the wound VAC is not placing any pressure in this area.  With regards to the left his left foot he does have a callus underneath the fifth metatarsal head there is no surrounding erythema callus is fairly thin but we could go forward and trim this at his next visit  Follow-Up Instructions: No follow-ups on file.   Ortho Exam  Patient is alert, oriented, no adenopathy, well-dressed, normal affect, normal respiratory effort. Right foot:DP pulse biphasic.  Mild soft tissue swelling.  The wound VAC was removed.  He has well apposed wound edges without any necrosis foul odor and minimal drainage at the distal end of the wound.  Sutures are in place.  No dehiscence. Left foot: He does have a thin callus over the plantar surface of the fifth metatarsal head.  There is no surrounding erythema no drainage  Imaging: No results found. No images are attached to the encounter.  Labs: Lab Results  Component Value Date   HGBA1C 6.4 (H) 07/24/2019   HGBA1C 6.5 (H) 07/18/2018   HGBA1C 6.4 (H) 10/16/2017   ESRSEDRATE 22 (H) 07/24/2019   CRP <0.5 07/24/2019   REPTSTATUS 07/29/2019 FINAL 07/24/2019   CULT  07/24/2019    NO GROWTH 5 DAYS Performed at [facility], [place] [address] [place], [place] [address]      Lab Results  Component Value Date   ALBUMIN 3.7 07/24/2019   ALBUMIN 3.2 (L) 04/24/2019   ALBUMIN 3.7 11/04/2018   PREALBUMIN 28.0 07/24/2019    Lab Results  Component Value Date   MG 1.8 04/26/2019   MG 1.8 04/24/2019   MG 2.1 03/22/2017   No results found for: VD25OH  Lab Results  Component Value Date   PREALBUMIN 28.0 07/24/2019   CBC EXTENDED Latest Ref Rng & Units 07/25/2019 07/24/2019 05/13/2019  WBC 4.0 - 10.5 K/uL 5.0 5.5 -  RBC 4.22 - 5.81 MIL/uL 3.96(L) 4.37 -  HGB 13.0 - 17.0 g/dL 11.5(L) 12.3(L) 15.0  HCT 39.0 - 52.0 % 36.8(L) 40.2 44.0  PLT 150 - 400 K/uL 235 259 -  NEUTROABS 1.7 - 7.7 K/uL - 2.4 -  LYMPHSABS 0.7 - 4.0 K/uL - 2.2 -     Body mass index is 24.25 kg/m.  Orders:  No orders of the defined types  were placed in this encounter.  No orders of the defined types were placed in this encounter.    Procedures: No procedures performed  Clinical Data: No additional findings.  ROS:  All other systems negative, except as noted in the HPI. Review of Systems  Objective: Vital Signs: Ht 5\' 10"  (1.778 m)   Wt 169 lb (76.7 kg)   BMI 24.25 kg/m   Specialty Comments:  No specialty comments available.  PMFS History: Patient Active Problem List   Diagnosis Date Noted  . Osteomyelitis of right foot ([place])   . Diabetic foot infection () 07/24/2019  . Chronic diastolic CHF (congestive heart failure) ([place]) 07/24/2019  . Diabetic ulcer of right midfoot associated with diabetes mellitus due to underlying condition, with fat layer exposed ([place])   . Gastroenteritis 04/24/2019  . Allergy, unspecified, initial encounter 01/27/2019  . DM neuropathy with neurologic complication ([place]) 26/37/8588  . GERD  (gastroesophageal reflux disease) 02/28/2018  . Nephrolithiasis 02/28/2018  . Sciatic leg pain 02/28/2018  . Snores 02/28/2018  . Orthopnea 02/23/2018  . Elevated troponin 02/23/2018  . HNP (herniated nucleus pulposus), lumbar 07/03/2017  . Encounter for removal of sutures 06/01/2017  . Chest pain in adult 04/27/2017  . Restless leg syndrome, uncontrolled 04/27/2017  . 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 Anemia of chronic disease 03/17/2017  . ESRD (end stage renal disease) on dialysis ([place]) 03/17/2017  . Chronic chest pain 03/17/2017  . Type II diabetes mellitus ([place]) 03/17/2017  . Acute on chronic diastolic heart failure ([place]) 03/17/2017  . Acute heart failure ([place]) 03/17/2017  . Lumbar radiculopathy 01/16/2017  . Pre-transplant evaluation for kidney transplant 06/15/2016  . Pain, unspecified 04/17/2016  . Increased frequency of urination 12/17/2015  . Nocturia 12/17/2015  . Chest pain, non-cardiac 10/09/2015  . Hypercalcemia 05/10/2015  . Other fluid overload 02/24/2015  . 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 Fatty (change of) liver, not elsewhere classified 07/02/2014  . Aftercare including intermittent dialysis ([place]) 06/24/2014  . Diarrhea, unspecified 06/24/2014  . Fever, unspecified 06/24/2014  . Iron deficiency anemia, unspecified 06/24/2014  . Other specified coagulation defects ([place]) 06/24/2014  . Pruritus, unspecified 06/24/2014  . Secondary hyperparathyroidism of renal origin ([place]) 06/24/2014  . Type 2 diabetes mellitus with diabetic peripheral angiopathy without gangrene ([place]) 06/24/2014  . Acute on chronic renal failure ([place]) 06/16/2014  . Shoulder pain, left 12/15/2013  . Chest pain 12/15/2013  . Claudication ([place]) 12/11/2013  . PVD (peripheral vascular disease) ([place]) 12/11/2013  . Carotid artery disease ([place]) 09/30/2013  . Acute chest pain 11/15/2012  . Bruit 09/15/2010  . CAD (coronary artery disease)  nonobstructive per cath 2012   . Hyperlipidemia LDL goal <70 07/22/2009  . Essential hypertension 07/22/2009   Past Medical History:  Diagnosis Date  . Anemia of chronic disease   . CAD (coronary artery disease)    a.  Myoview 4/11: EF 53%, no scar or ischemia   c. MV 2012 Nl perfusion, apical thinning.  No ischemia or scar.  EF 49%, appears greater by visual estimate.;  d.  Dob stress echo 12/13:  Negative Dob stress echo. There is no evidence of ischemia.  The LVF is normal. b. Normal cors 2016.  . Carotid stenosis    a. <50% RICA, >27% LICA by duplex 10/4126  . Chronic chest pain    occ  . ESRD (end stage renal disease) on dialysis ([place])    M-W-F  . GERD (gastroesophageal  reflux disease)   . HNP (herniated nucleus pulposus), lumbar   . HTN (hypertension)    echo 3/10: EF 60%, LAE  . Hyperlipidemia   . Nephrolithiasis    "passed them all"  . Peripheral arterial disease ([place])    a. s/p PTCA to L SFA.  [place] Kitchen Pneumonia yrs ago  . Restless legs   . Sciatic leg pain   . Sleep apnea    no cpap, needs to reschedule appointment to set up aquiring cpap  . Snores    a. presumed OSA, pt has refused sleep eval in past.  . Type II diabetes mellitus ([place])    no longer on medications, checks blood glucose at home  . Urinary frequency     Family History  Problem Relation Age of Onset  . Heart attack Sister        died @ 58  . Cancer Mother        died @ 55; unknown type  . Diabetes Brother        deceased  . Cirrhosis Father        alcohol related  . Diabetes Father   . Esophageal cancer Neg Hx   . Colon cancer Neg Hx   . Pancreatic cancer Neg Hx   . Stomach cancer Neg Hx     Past Surgical History:  Procedure Laterality Date  . AMPUTATION Right 07/25/2019   Procedure: RIGHT 5th RAY AMPUTATION;  Surgeon: Newt Minion, MD;  Location: [facility];  Service: Orthopedics;  Laterality: Right;  . ANGIOPLASTY / STENTING FEMORAL Left 12/11/2013   dr berry  . AV FISTULA PLACEMENT Left  03/19/2014   Procedure: CREATION OF ARTERIOVENOUS (AV) FISTULA  LEFT UPPER ARM;  Surgeon: Mal Misty, MD;  Location: [facility];  Service: Vascular;  Laterality: Left;  . BACK SURGERY  01/2018   screws placed   . CARDIAC CATHETERIZATION  2001 and 2010   . COLONOSCOPY W/ BIOPSIES AND POLYPECTOMY    . COLONOSCOPY WITH PROPOFOL N/A 08/01/2016   Procedure: COLONOSCOPY WITH PROPOFOL;  Surgeon: Carol Ada, MD;  Location: [facility];  Service: Endoscopy;  Laterality: N/A;  . CORONARY ARTERY BYPASS GRAFT  2019   [facility] x 1 bypass  . ESOPHAGOGASTRODUODENOSCOPY (EGD) WITH PROPOFOL N/A 08/01/2016   Procedure: ESOPHAGOGASTRODUODENOSCOPY (EGD) WITH PROPOFOL;  Surgeon: Carol Ada, MD;  Location: [facility];  Service: Endoscopy;  Laterality: N/A;  . FOOT FRACTURE SURGERY Right    ligament repair  . FRACTURE SURGERY     left forearm  . GRAFT APPLICATION Right 06/14/1060   Procedure: FAT GRAFT APPLICATION;  Surgeon: Evelina Bucy, DPM;  Location: [facility];  Service: Podiatry;  Laterality: Right;  . INGUINAL HERNIA REPAIR Left   . LEFT HEART CATH AND CORS/GRAFTS ANGIOGRAPHY N/A 04/27/2017   Procedure: LEFT HEART CATH AND CORS/GRAFTS ANGIOGRAPHY;  Surgeon: Leonie Man, MD;  Location: [facility];  Service: Cardiovascular;  Laterality: N/A;  . LEFT HEART CATHETERIZATION WITH CORONARY ANGIOGRAM N/A 06/22/2014   Procedure: LEFT HEART CATHETERIZATION WITH CORONARY ANGIOGRAM;  Surgeon: Troy Sine, MD;  Location: [facility] C[facility] LAB;  Service: Cardiovascular;  Laterality: N/A;  . LOWER EXTREMITY ANGIOGRAM Left 12/11/2013   Procedure: LOWER EXTREMITY ANGIOGRAM;  Surgeon: Lorretta Harp, MD;  Location: [facility] C[facility] LAB;  Service: Cardiovascular;  Laterality: Left;  . LUMBAR LAMINECTOMY/DECOMPRESSION MICRODISCECTOMY Right 07/03/2017   Procedure: MICRODISCECTOMY LUMBAR FIVE - SACRAL ONE RIGHT;  Surgeon: Consuella Lose, MD;  Location: [facility];  Service: Neurosurgery;  Laterality: Right;  .  LUMBAR LAMINECTOMY/DECOMPRESSION MICRODISCECTOMY Right 10/19/2017   Procedure: MICRODISCECTOMY LUMBAR FIVE- SACRAL 1 ONE ;  Surgeon: Consuella Lose, MD;  Location: [facility];  Service: Neurosurgery;  Laterality: Right;  . TONSILLECTOMY AND ADENOIDECTOMY    . WISDOM TOOTH EXTRACTION    . WOUND DEBRIDEMENT Right 05/13/2019   Procedure: DEBRIDEMENT WOUND;  Surgeon: Evelina Bucy, DPM;  Location: [facility];  Service: Podiatry;  Laterality: Right;   Social History   Occupational History  . Occupation: retired  Tobacco Use  . Smoking status: Former Smoker    Packs/day: 1.00    Years: 2.00    Pack years: 2.00    Types: Cigarettes    Quit date: 1976    Years since quitting: 45.3  . Smokeless tobacco: Never Used  . Tobacco comment: quit smoking 40 yrs ago  Substance and Sexual Activity  . Alcohol use: No    Alcohol/week: 0.0 standard drinks    Comment: h/o social drinking  . Drug use: Not Currently    Types: Marijuana  . Sexual activity: Yes    Birth control/protection: None

## 2019-08-07 ENCOUNTER — Other Ambulatory Visit: Payer: Self-pay

## 2019-08-07 ENCOUNTER — Ambulatory Visit (INDEPENDENT_AMBULATORY_CARE_PROVIDER_SITE_OTHER): Payer: Medicare Other | Admitting: Orthopedic Surgery

## 2019-08-07 DIAGNOSIS — Z89421 Acquired absence of other right toe(s): Secondary | ICD-10-CM

## 2019-08-07 MED ORDER — NITROGLYCERIN 0.2 MG/HR TD PT24: 0.2000 mg | MEDICATED_PATCH | Freq: Every day | TRANSDERMAL | 12 refills | Status: DC

## 2019-08-11 ENCOUNTER — Other Ambulatory Visit: Payer: Self-pay | Admitting: *Deleted

## 2019-08-11 DIAGNOSIS — M7989 Other specified soft tissue disorders: Secondary | ICD-10-CM

## 2019-08-11 DIAGNOSIS — M79605 Pain in left leg: Secondary | ICD-10-CM

## 2019-08-12 ENCOUNTER — Ambulatory Visit: Payer: Medicare Other | Admitting: Orthopedic Surgery

## 2019-08-12 ENCOUNTER — Encounter: Payer: Self-pay | Admitting: Orthopedic Surgery

## 2019-08-12 NOTE — Progress Notes (Signed)
Office Visit Note   Patient: Jon Anderson           Date of Birth: 1951-04-04           MRN: [id number] Visit Date: 08/07/2019              Requested by: [name], MD [id number],  [place] [address] PCP: [name], MD  Chief Complaint  Patient presents with  . Right Foot - Routine Post Op    07/25/19 right 5th ray amputation       HPI: Patient is a 69 year old gentleman who presents 2 weeks status post right foot fifth ray amputation patient is currently in a wheelchair postoperative shoe states he has some swelling with discoloration of the tip of the great toe.  Patient is on dialysis.  Assessment & Plan: Visit Diagnoses:  1. 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  Follow-Up Instructions: Return in about 1 week (around 08/14/2019).   Ortho Exam  Patient is alert, oriented, no adenopathy, well-dressed, normal affect, normal respiratory effort. Examination patient has a faint dorsalis pedis and posterior tibial pulse.  There is a partial-thickness ischemic ulcer over the tip of the right great toe.  The incision is well approximated with no wound dehiscence.  Imaging: No results found. No images are attached to the encounter.  Labs: Lab Results  Component Value Date   HGBA1C 6.4 (H) 07/24/2019   HGBA1C 6.5 (H) 07/18/2018   HGBA1C 6.4 (H) 10/16/2017   ESRSEDRATE 22 (H) 07/24/2019   CRP <0.5 07/24/2019   REPTSTATUS 07/29/2019 FINAL 07/24/2019   CULT  07/24/2019    NO GROWTH 5 DAYS Performed at [facility], [place] [address], [place], [place] [address]      Lab Results  Component Value Date   ALBUMIN 3.7 07/24/2019   ALBUMIN 3.2 (L) 04/24/2019   ALBUMIN 3.7  11/04/2018   PREALBUMIN 28.0 07/24/2019    Lab Results  Component Value Date   MG 1.8 04/26/2019   MG 1.8 04/24/2019   MG 2.1 03/22/2017   No results found for: VD25OH  Lab Results  Component Value Date   PREALBUMIN 28.0 07/24/2019   CBC EXTENDED Latest Ref Rng & Units 07/25/2019 07/24/2019 05/13/2019  WBC 4.0 - 10.5 K/uL 5.0 5.5 -  RBC 4.22 - 5.81 MIL/uL 3.96(L) 4.37 -  HGB 13.0 - 17.0 g/dL 11.5(L) 12.3(L) 15.0  HCT 39.0 - 52.0 % 36.8(L) 40.2 44.0  PLT 150 - 400 K/uL 235 259 -  NEUTROABS 1.7 - 7.7 K/uL - 2.4 -  LYMPHSABS 0.7 - 4.0 K/uL - 2.2 -     There is no height or weight on file to calculate BMI.  Orders:  No orders of the defined types were placed in this encounter.  Meds ordered this encounter  Medications  . nitroGLYCERIN (NITRODUR - DOSED IN MG/24 HR) 0.2 mg/hr patch    Sig: Place 1 patch (0.2 mg total) onto the skin daily.    Dispense:  30 patch    Refill:  12     Procedures: No procedures performed  Clinical Data: No additional findings.  ROS:  All other systems  negative, except as noted in the HPI. Review of Systems  Objective: Vital Signs: There were no vitals taken for this visit.  Specialty Comments:  No specialty comments available.  PMFS History: Patient Active Problem List   Diagnosis Date Noted  . Osteomyelitis of right foot ([place])   . Diabetic foot infection ([place]) 07/24/2019  . Chronic diastolic CHF (congestive heart failure) ([place]) 07/24/2019  . Diabetic ulcer of right midfoot associated with diabetes mellitus due to underlying condition, with fat layer exposed ([place])   . Gastroenteritis 04/24/2019  . Allergy, unspecified, initial encounter 01/27/2019  . DM neuropathy with neurologic complication ([place]) 33/54/5625  . GERD (gastroesophageal reflux disease) 02/28/2018  . Nephrolithiasis 02/28/2018  . Sciatic leg pain 02/28/2018  . Snores 02/28/2018  . Orthopnea 02/23/2018  . Elevated troponin 02/23/2018  . HNP (herniated nucleus  pulposus), lumbar 07/03/2017  . Encounter for removal of sutures 06/01/2017  . Chest pain in adult 04/27/2017  . Restless leg syndrome, uncontrolled 04/27/2017  . 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 Anemia of chronic disease 03/17/2017  . ESRD (end stage renal disease) on dialysis ([place]) 03/17/2017  . Chronic chest pain 03/17/2017  . Type II diabetes mellitus ([place]) 03/17/2017  . Acute on chronic diastolic heart failure ([place]) 03/17/2017  . Acute heart failure ([place]) 03/17/2017  . Lumbar radiculopathy 01/16/2017  . Pre-transplant evaluation for kidney transplant 06/15/2016  . Pain, unspecified 04/17/2016  . Increased frequency of urination 12/17/2015  . Nocturia 12/17/2015  . Chest pain, non-cardiac 10/09/2015  . Hypercalcemia 05/10/2015  . Other fluid overload 02/24/2015  . 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 Fatty (change of) liver, not elsewhere classified 07/02/2014  . Aftercare including intermittent dialysis ([place]) 06/24/2014  . Diarrhea, unspecified 06/24/2014  . Fever, unspecified 06/24/2014  . Iron deficiency anemia, unspecified 06/24/2014  . Other specified coagulation defects ([place]) 06/24/2014  . Pruritus, unspecified 06/24/2014  . Secondary hyperparathyroidism of renal origin ([place]) 06/24/2014  . Type 2 diabetes mellitus with diabetic peripheral angiopathy without gangrene ([place]) 06/24/2014  . Acute on chronic renal failure ([place]) 06/16/2014  . Shoulder pain, left 12/15/2013  . Chest pain 12/15/2013  . Claudication ([place]) 12/11/2013  . PVD (peripheral vascular disease) ([place]) 12/11/2013  . Carotid artery disease ([place]) 09/30/2013  . Acute chest pain 11/15/2012  . Bruit 09/15/2010  . CAD (coronary artery disease) nonobstructive per cath 2012   . Hyperlipidemia LDL goal <70 07/22/2009  . Essential hypertension 07/22/2009   Past Medical History:  Diagnosis Date  . Anemia of chronic disease   . CAD (coronary artery  disease)    a.  Myoview 4/11: EF 53%, no scar or ischemia   c. MV 2012 Nl perfusion, apical thinning.  No ischemia or scar.  EF 49%, appears greater by visual estimate.;  d.  Dob stress echo 12/13:  Negative Dob stress echo. There is no evidence of ischemia.  The LVF is normal. b. Normal cors 2016.  . Carotid stenosis    a. <63% RICA, >89% LICA by duplex 06/7340  . Chronic chest pain    occ  . ESRD (end stage renal disease) on dialysis ([place])    M-W-F  . GERD (gastroesophageal reflux disease)   . HNP (herniated nucleus pulposus), lumbar   . HTN (hypertension)    echo 3/10: EF 60%, LAE  . Hyperlipidemia   . Nephrolithiasis    "passed them all"  . Peripheral arterial disease ([place])    a.  s/p PTCA to L SFA.  Marland Kitchen Pneumonia yrs ago  . Restless legs   . Sciatic leg pain   . Sleep apnea    no cpap, needs to reschedule appointment to set up aquiring cpap  . Snores    a. presumed OSA, pt has refused sleep eval in past.  . Type II diabetes mellitus ([place])    no longer on medications, checks blood glucose at home  . Urinary frequency     Family History  Problem Relation Age of Onset  . Heart attack Sister        died @ 70  . Cancer Mother        died @ 51; unknown type  . Diabetes Brother        deceased  . Cirrhosis Father        alcohol related  . Diabetes Father   . Esophageal cancer Neg Hx   . Colon cancer Neg Hx   . Pancreatic cancer Neg Hx   . Stomach cancer Neg Hx     Past Surgical History:  Procedure Laterality Date  . AMPUTATION Right 07/25/2019   Procedure: RIGHT 5th RAY AMPUTATION;  Surgeon: Newt Minion, MD;  Location: [facility];  Service: Orthopedics;  Laterality: Right;  . ANGIOPLASTY / STENTING FEMORAL Left 12/11/2013   dr berry  . AV FISTULA PLACEMENT Left 03/19/2014   Procedure: CREATION OF ARTERIOVENOUS (AV) FISTULA  LEFT UPPER ARM;  Surgeon: Mal Misty, MD;  Location: [facility];  Service: Vascular;  Laterality: Left;  . BACK SURGERY  01/2018   screws placed   .  CARDIAC CATHETERIZATION  2001 and 2010   . COLONOSCOPY W/ BIOPSIES AND POLYPECTOMY    . COLONOSCOPY WITH PROPOFOL N/A 08/01/2016   Procedure: COLONOSCOPY WITH PROPOFOL;  Surgeon: Carol Ada, MD;  Location: [facility];  Service: Endoscopy;  Laterality: N/A;  . CORONARY ARTERY BYPASS GRAFT  2019   [facility] x 1 bypass  . ESOPHAGOGASTRODUODENOSCOPY (EGD) WITH PROPOFOL N/A 08/01/2016   Procedure: ESOPHAGOGASTRODUODENOSCOPY (EGD) WITH PROPOFOL;  Surgeon: Carol Ada, MD;  Location: [facility];  Service: Endoscopy;  Laterality: N/A;  . FOOT FRACTURE SURGERY Right    ligament repair  . FRACTURE SURGERY     left forearm  . GRAFT APPLICATION Right 04/15/1759   Procedure: FAT GRAFT APPLICATION;  Surgeon: Evelina Bucy, DPM;  Location: [facility];  Service: Podiatry;  Laterality: Right;  . INGUINAL HERNIA REPAIR Left   . LEFT HEART CATH AND CORS/GRAFTS ANGIOGRAPHY N/A 04/27/2017   Procedure: LEFT HEART CATH AND CORS/GRAFTS ANGIOGRAPHY;  Surgeon: Leonie Man, MD;  Location: [facility];  Service: Cardiovascular;  Laterality: N/A;  . LEFT HEART CATHETERIZATION WITH CORONARY ANGIOGRAM N/A 06/22/2014   Procedure: LEFT HEART CATHETERIZATION WITH CORONARY ANGIOGRAM;  Surgeon: Troy Sine, MD;  Location: [facility];  Service: Cardiovascular;  Laterality: N/A;  . LOWER EXTREMITY ANGIOGRAM Left 12/11/2013   Procedure: LOWER EXTREMITY ANGIOGRAM;  Surgeon: Lorretta Harp, MD;  Location[facility];  Service: Cardiovascular;  Laterality: Left;  . LUMBAR LAMINECTOMY/DECOMPRESSION MICRODISCECTOMY Right 07/03/2017   Procedure: MICRODISCECTOMY LUMBAR FIVE - SACRAL ONE RIGHT;  Surgeon: Consuella Lose, MD;  Location: [facility];  Service: Neurosurgery;  Laterality: Right;  . LUMBAR LAMINECTOMY/DECOMPRESSION MICRODISCECTOMY Right 10/19/2017   Procedure: MICRODISCECTOMY LUMBAR FIVE- SACRAL 1 ONE ;  Surgeon: Consuella Lose, MD;  Location: [facility];  Service: Neurosurgery;  Laterality: Right;   . TONSILLECTOMY AND ADENOIDECTOMY    .  WISDOM TOOTH EXTRACTION    . WOUND DEBRIDEMENT Right 05/13/2019   Procedure: DEBRIDEMENT WOUND;  Surgeon: Evelina Bucy, DPM;  Location: [facility];  Service: Podiatry;  Laterality: Right;   Social History   Occupational History  . Occupation: retired  Tobacco Use  . Smoking status: Former Smoker    Packs/day: 1.00    Years: 2.00    Pack years: 2.00    Types: Cigarettes    Quit date: 1976    Years since quitting: 45.3  . Smokeless tobacco: Never Used  . Tobacco comment: quit smoking 40 yrs ago  Substance and Sexual Activity  . Alcohol use: No    Alcohol/week: 0.0 standard drinks    Comment: h/o social drinking  . Drug use: Not Currently    Types: Marijuana  . Sexual activity: Yes    Birth control/protection: None

## 2019-08-14 ENCOUNTER — Ambulatory Visit (INDEPENDENT_AMBULATORY_CARE_PROVIDER_SITE_OTHER): Payer: Medicare Other | Admitting: Vascular Surgery

## 2019-08-14 ENCOUNTER — Encounter: Payer: Self-pay | Admitting: Vascular Surgery

## 2019-08-14 ENCOUNTER — Ambulatory Visit (INDEPENDENT_AMBULATORY_CARE_PROVIDER_SITE_OTHER): Payer: Medicare Other

## 2019-08-14 ENCOUNTER — Other Ambulatory Visit: Payer: Self-pay

## 2019-08-14 ENCOUNTER — Ambulatory Visit (INDEPENDENT_AMBULATORY_CARE_PROVIDER_SITE_OTHER): Payer: Medicare Other | Admitting: Physician Assistant

## 2019-08-14 ENCOUNTER — Encounter: Payer: Self-pay | Admitting: Orthopedic Surgery

## 2019-08-14 ENCOUNTER — Ambulatory Visit (HOSPITAL_COMMUNITY)
Admission: RE | Admit: 2019-08-14 | Discharge: 2019-08-14 | Disposition: A | Discharge status: Home / Self Care | Payer: Medicare Other | Source: Ambulatory Visit | Attending: Surgery | Admitting: Surgery

## 2019-08-14 VITALS — BP 125/69 | HR 77 | Temp 97.7°F | Resp 20 | Ht 70.0 in | Wt 163.0 lb

## 2019-08-14 VITALS — Ht 70.0 in | Wt 163.0 lb

## 2019-08-14 DIAGNOSIS — I739 Peripheral vascular disease, unspecified: Secondary | ICD-10-CM

## 2019-08-14 DIAGNOSIS — M86271 Subacute osteomyelitis, right ankle and foot: Secondary | ICD-10-CM

## 2019-08-14 DIAGNOSIS — M7989 Other specified soft tissue disorders: Secondary | ICD-10-CM

## 2019-08-14 DIAGNOSIS — M79605 Pain in left leg: Secondary | ICD-10-CM | POA: Diagnosis not present

## 2019-08-14 NOTE — Progress Notes (Signed)
Office Visit Note   Patient: Jon Anderson           Date of Birth: 05-30-1950           MRN: [id number] Visit Date: 08/14/2019              Requested by: [name], MD [id number] [place],  [place] [address] PCP: [name], MD  Chief Complaint  Patient presents with  . Right Foot - Routine Post Op    07/25/19 right 5th ray amputation       HPI: This is a pleasant gentleman who is here in follow-up.  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  He has been slow to heal.  He does relate that he did go to see vascular vein specialist this morning and they are planning to do a procedure on his right lower extremity next week he is concerned about the swelling and discoloration of his foot and his great toe  Assessment & Plan: Visit Diagnoses:  1. Subacute osteomyelitis of right foot ([place])     Plan: Patient will go for revascularization procedure next week on his right lower extremity.  Based on his x-ray he also has calcified circulation into the toes of the smaller vessels and this probably cannot be improved.  He will follow-up with Dr. Sharol Given the following Monday after his procedure for reevaluation of the toe  Follow-Up Instructions: No follow-ups on file.   Ortho Exam  Patient is alert, oriented, no adenopathy, well-dressed, normal affect, normal respiratory effort. Focused examination his fifth ray amputation is actually healed well.  And his sutures were removed today.  He does have swelling and discoloration in his great toe.  Pulses are not palpable.  ABIs were reviewed that were done earlier today demonstrating monophasic circulation on the right side. There is there is no surrounding cellulitis no open ulcer areas.  He does have some necrotic changes on the tip of the great toe no foul odor Imaging: VAS [place] ABI WITH/WO TBI  Result Date: 08/14/2019 LOWER EXTREMITY DOPPLER STUDY Indications: Claudication, ulceration, and peripheral artery disease. High  Risk Factors: No history of smoking.  Vascular Interventions: Left SFA angioplasty 12/11/2013. Comparison Study: Prior study performed at an outside facility on 04/15/2019                   suggested non compressible tibial vessels and absent digital                   photoplethysmographic signals. Performing Technologist: Delorise Shiner RVT  Examination Guidelines: A complete evaluation includes at minimum, Doppler waveform signals and systolic blood pressure reading at the level of bilateral brachial, anterior tibial, and posterior tibial arteries, when vessel segments are accessible. Bilateral testing is considered an integral part of a complete examination. Photoelectric Plethysmograph (PPG) waveforms and toe systolic pressure readings are included as required and additional duplex testing as needed. Limited examinations for reoccurring indications may be performed as noted.  ABI Findings: +---------+------------------+-----+----------+--------+ Right    Rt Pressure (mmHg)IndexWaveform  Comment  +---------+------------------+-----+----------+--------+ Brachial 125                                       +---------+------------------+-----+----------+--------+ ATA      86                0.69                    +---------+------------------+-----+----------+--------+  PTA      83                0.66 monophasic         +---------+------------------+-----+----------+--------+ DP                              monophasic         +---------+------------------+-----+----------+--------+ Great Toe30                0.24                    +---------+------------------+-----+----------+--------+ +---------+------------------+-----+---------+---------------+ Left     Lt Pressure (mmHg)IndexWaveform Comment         +---------+------------------+-----+---------+---------------+ Brachial                                 Dialysis access  +---------+------------------+-----+---------+---------------+ ATA      148               1.18                          +---------+------------------+-----+---------+---------------+ PTA      156               1.25 triphasic                +---------+------------------+-----+---------+---------------+ DP                              biphasic                 +---------+------------------+-----+---------+---------------+ Great Toe63                0.50                          +---------+------------------+-----+---------+---------------+ +-------+-----------+-----------+------------+------------+ ABI/TBIToday's ABIToday's TBIPrevious ABIPrevious TBI +-------+-----------+-----------+------------+------------+ Right  0.69       0.24                                +-------+-----------+-----------+------------+------------+ Left   1.25       0.50                                +-------+-----------+-----------+------------+------------+  Summary: Right: Resting right ankle-brachial index indicates moderate right lower extremity arterial disease. The right toe-brachial index is abnormal. RT great toe pressure = 30 mmHg. Left: Resting left ankle-brachial index is within normal range. No evidence of significant left lower extremity arterial disease. The left toe-brachial index is abnormal. LT Great toe pressure = 63 mmHg.  *See table(s) above for measurements and observations.  Electronically signed by Ruta Hinds MD on 08/14/2019 at 10:43:52 AM.    Final    No images are attached to the encounter.  Labs: Lab Results  Component Value Date   HGBA1C 6.4 (H) 07/24/2019   HGBA1C 6.5 (H) 07/18/2018   HGBA1C 6.4 (H) 10/16/2017   ESRSEDRATE 22 (H) 07/24/2019   CRP <0.5 07/24/2019   REPTSTATUS 07/29/2019 FINAL 07/24/2019   CULT  07/24/2019    NO GROWTH 5 DAYS Performed at [facility], [place] [address] [place], [place] [address]  Lab Results  Component Value  Date   ALBUMIN 3.7 07/24/2019   ALBUMIN 3.2 (L) 04/24/2019   ALBUMIN 3.7 11/04/2018   PREALBUMIN 28.0 07/24/2019    Lab Results  Component Value Date   MG 1.8 04/26/2019   MG 1.8 04/24/2019   MG 2.1 03/22/2017   No results found for: VD25OH  Lab Results  Component Value Date   PREALBUMIN 28.0 07/24/2019   CBC EXTENDED Latest Ref Rng & Units 07/25/2019 07/24/2019 05/13/2019  WBC 4.0 - 10.5 K/uL 5.0 5.5 -  RBC 4.22 - 5.81 MIL/uL 3.96(L) 4.37 -  HGB 13.0 - 17.0 g/dL 11.5(L) 12.3(L) 15.0  HCT 39.0 - 52.0 % 36.8(L) 40.2 44.0  PLT 150 - 400 K/uL 235 259 -  NEUTROABS 1.7 - 7.7 K/uL - 2.4 -  LYMPHSABS 0.7 - 4.0 K/uL - 2.2 -     Body mass index is 23.39 kg/m.  Orders:  Orders Placed This Encounter  Procedures  . XR Foot 2 Views Right   No orders of the defined types were placed in this encounter.    Procedures: No procedures performed  Clinical Data: No additional findings.  ROS:  All other systems negative, except as noted in the HPI. Review of Systems  Objective: Vital Signs: Ht 5\' 10"  (1.778 m)   Wt 163 lb (73.9 kg)   BMI 23.39 kg/m   Specialty Comments:  No specialty comments available.  PMFS History: Patient Active Problem List   Diagnosis Date Noted  . Osteomyelitis of right foot ([place])   . Diabetic foot infection ([place]) 07/24/2019  . Chronic diastolic CHF (congestive heart failure) ([place]) 07/24/2019  . Diabetic ulcer of right midfoot associated with diabetes mellitus due to underlying condition, with fat layer exposed ([place])   . Gastroenteritis 04/24/2019  . Allergy, unspecified, initial encounter 01/27/2019  . DM neuropathy with neurologic complication ([place]) 97/05/6376  . GERD (gastroesophageal reflux disease) 02/28/2018  . Nephrolithiasis 02/28/2018  . Sciatic leg pain 02/28/2018  . Snores 02/28/2018  . Orthopnea 02/23/2018  . Elevated troponin 02/23/2018  . HNP (herniated nucleus pulposus), lumbar 07/03/2017  . Encounter for removal of sutures  06/01/2017  . Chest pain in adult 04/27/2017  . Restless leg syndrome, uncontrolled 04/27/2017  . 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 Anemia of chronic disease 03/17/2017  . ESRD (end stage renal disease) on dialysis ([place]) 03/17/2017  . Chronic chest pain 03/17/2017  . Type II diabetes mellitus ([place]) 03/17/2017  . Acute on chronic diastolic heart failure ([place]) 03/17/2017  . Acute heart failure ([place]) 03/17/2017  . Lumbar radiculopathy 01/16/2017  . Pre-transplant evaluation for kidney transplant 06/15/2016  . Pain, unspecified 04/17/2016  . Increased frequency of urination 12/17/2015  . Nocturia 12/17/2015  . Chest pain, non-cardiac 10/09/2015  . Hypercalcemia 05/10/2015  . Other fluid overload 02/24/2015  . 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 Fatty (change of) liver, not elsewhere classified 07/02/2014  . Aftercare including intermittent dialysis ([place]) 06/24/2014  . Diarrhea, unspecified 06/24/2014  . Fever, unspecified 06/24/2014  . Iron deficiency anemia, unspecified 06/24/2014  . Other specified coagulation defects ([place]) 06/24/2014  . Pruritus, unspecified 06/24/2014  . Secondary hyperparathyroidism of renal origin ([place]) 06/24/2014  . Type 2 diabetes mellitus with diabetic peripheral angiopathy without gangrene ([place]) 06/24/2014  . Acute on chronic renal failure ([place]) 06/16/2014  . Shoulder pain, left 12/15/2013  . Chest pain 12/15/2013  . Claudication ([place]) 12/11/2013  . PVD (peripheral vascular disease) (  [place]) 12/11/2013  . Carotid artery disease ([place]) 09/30/2013  . Acute chest pain 11/15/2012  . Bruit 09/15/2010  . CAD (coronary artery disease) nonobstructive per cath 2012   . Hyperlipidemia LDL goal <70 07/22/2009  . Essential hypertension 07/22/2009   Past Medical History:  Diagnosis Date  . Anemia of chronic disease   . CAD (coronary artery disease)    a.  Myoview 4/11: EF 53%, no scar or ischemia   c. MV 2012  Nl perfusion, apical thinning.  No ischemia or scar.  EF 49%, appears greater by visual estimate.;  d.  Dob stress echo 12/13:  Negative Dob stress echo. There is no evidence of ischemia.  The LVF is normal. b. Normal cors 2016.  . Carotid stenosis    a. <07% RICA, >37% LICA by duplex 04/624  . Chronic chest pain    occ  . ESRD (end stage renal disease) on dialysis ([place])    M-W-F  . GERD (gastroesophageal reflux disease)   . HNP (herniated nucleus pulposus), lumbar   . HTN (hypertension)    echo 3/10: EF 60%, LAE  . Hyperlipidemia   . Nephrolithiasis    "passed them all"  . Peripheral arterial disease ([place])    a. s/p PTCA to L SFA.  Marland Kitchen Pneumonia yrs ago  . Restless legs   . Sciatic leg pain   . Sleep apnea    no cpap, needs to reschedule appointment to set up aquiring cpap  . Snores    a. presumed OSA, pt has refused sleep eval in past.  . Type II diabetes mellitus ([place])    no longer on medications, checks blood glucose at home  . Urinary frequency     Family History  Problem Relation Age of Onset  . Heart attack Sister        died @ 42  . Cancer Mother        died @ 39; unknown type  . Diabetes Brother        deceased  . Cirrhosis Father        alcohol related  . Diabetes Father   . Esophageal cancer Neg Hx   . Colon cancer Neg Hx   . Pancreatic cancer Neg Hx   . Stomach cancer Neg Hx     Past Surgical History:  Procedure Laterality Date  . AMPUTATION Right 07/25/2019   Procedure: RIGHT 5th RAY AMPUTATION;  Surgeon: Newt Minion, MD;  Location: [facility];  Service: Orthopedics;  Laterality: Right;  . ANGIOPLASTY / STENTING FEMORAL Left 12/11/2013   dr berry  . AV FISTULA PLACEMENT Left 03/19/2014   Procedure: CREATION OF ARTERIOVENOUS (AV) FISTULA  LEFT UPPER ARM;  Surgeon: Mal Misty, MD;  Location: [facility];  Service: Vascular;  Laterality: Left;  . BACK SURGERY  01/2018   screws placed   . CARDIAC CATHETERIZATION  2001 and 2010   . COLONOSCOPY W/ BIOPSIES AND  POLYPECTOMY    . COLONOSCOPY WITH PROPOFOL N/A 08/01/2016   Procedure: COLONOSCOPY WITH PROPOFOL;  Surgeon: Carol Ada, MD;  Location: [facility];  Service: Endoscopy;  Laterality: N/A;  . CORONARY ARTERY BYPASS GRAFT  2019   [facility] x [facility] bypass  . ESOPHAGOGASTRODUODENOSCOPY (EGD) WITH PROPOFOL N/A 08/01/2016   Procedure: ESOPHAGOGASTRODUODENOSCOPY (EGD) WITH PROPOFOL;  Surgeon: Carol Ada, MD;  Location: [facility];  Service: Endoscopy;  Laterality: N/A;  . FOOT FRACTURE SURGERY Right    ligament repair  . FRACTURE SURGERY     left  forearm  . GRAFT APPLICATION Right 08/16/5927   Procedure: FAT GRAFT APPLICATION;  Surgeon: Evelina Bucy, DPM;  Location: [facility];  Service: Podiatry;  Laterality: Right;  . INGUINAL HERNIA REPAIR Left   . LEFT HEART CATH AND CORS/GRAFTS ANGIOGRAPHY N/A 04/27/2017   Procedure: LEFT HEART CATH AND CORS/GRAFTS ANGIOGRAPHY;  Surgeon: Leonie Man, MD;  Location: [facility];  Service: Cardiovascular;  Laterality: N/A;  . LEFT HEART CATHETERIZATION WITH CORONARY ANGIOGRAM N/A 06/22/2014   Procedure: LEFT HEART CATHETERIZATION WITH CORONARY ANGIOGRAM;  Surgeon: Troy Sine, MD;  Location[facility];  Service: Cardiovascular;  Laterality: N/A;  . LOWER EXTREMITY ANGIOGRAM Left 12/11/2013   Procedure: LOWER EXTREMITY ANGIOGRAM;  Surgeon: Lorretta Harp, MD;  Location: [facility] CATH LAB;  Service: Cardiovascular;  Laterality: Left;  . LUMBAR LAMINECTOMY/DECOMPRESSION MICRODISCECTOMY Right 07/03/2017   Procedure: MICRODISCECTOMY LUMBAR FIVE - SACRAL ONE RIGHT;  Surgeon: Consuella Lose, MD;  Location: [facility];  Service: Neurosurgery;  Laterality: Right;  . LUMBAR LAMINECTOMY/DECOMPRESSION MICRODISCECTOMY Right 10/19/2017   Procedure: MICRODISCECTOMY LUMBAR FIVE- SACRAL 1 ONE ;  Surgeon: Consuella Lose, MD;  Location: [facility];  Service: Neurosurgery;  Laterality: Right;  . TONSILLECTOMY AND ADENOIDECTOMY    . WISDOM TOOTH EXTRACTION    .  WOUND DEBRIDEMENT Right 05/13/2019   Procedure: DEBRIDEMENT WOUND;  Surgeon: Evelina Bucy, DPM;  [facility];  Service: Podiatry;  Laterality: Right;   Social History   Occupational History  . Occupation: retired  Tobacco Use  . Smoking status: Former Smoker    Packs/day: 1.00    Years: 2.00    Pack years: 2.00    Types: Cigarettes    Quit date: 1976    Years since quitting: 45.3  . Smokeless tobacco: Never Used  . Tobacco comment: quit smoking 40 yrs ago  Substance and Sexual Activity  . Alcohol use: No    Alcohol/week: 0.0 standard drinks    Comment: h/o social drinking  . Drug use: Not Currently    Types: Marijuana  . Sexual activity: Yes    Birth control/protection: None

## 2019-08-14 NOTE — Progress Notes (Signed)
Referring Physician: Jeri Cos PA  Patient name: Jon Anderson MRN: [id number] DOB: June 25, 1950 Sex: male  REASON FOR CONSULT: Nonhealing wounds right foot  HPI: Jon Anderson is a 69 y.o. male, referred for evaluation of a nonhealing wound of his right foot.  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  He underwent multiple debridements by Dr. March Rummage starting in January 2021.  The wounds failed to heal.  Patient eventually went to the wound center.  He was then referred for further evaluation for arterial pathology to [place].  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 Sharol Given July 25, 2019.  Since that time he has now developed darkening discoloration of the right tip of the first toe.  He previously underwent an arteriogram by Dr. Gwenlyn Found in 2015.  At that point he underwent a left superficial femoral artery intervention with drug-eluting balloon.  This was done for claudication.  Patient currently does not have claudication symptoms.  He was seen by Dr. Gwenlyn Found in March 2021 with a wound on his right foot but thought that this was not due to vascular disease.    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  This was a study done by [facility] radiology.  Hyperlipidemia, hypertension, diabetes all of which are currently stable.  Past Medical History:  Diagnosis Date  . Anemia of chronic disease   . CAD (coronary artery disease)    a.  Myoview 4/11: EF 53%, no scar or ischemia   c. MV 2012 Nl perfusion, apical thinning.  No ischemia or scar.  EF 49%, appears greater by visual estimate.;  d.  Dob stress echo 12/13:  Negative Dob stress  echo. There is no evidence of ischemia.  The LVF is normal. b. Normal cors 2016.  . Carotid stenosis    a. <00% RICA, >86% LICA by duplex 10/6193  . Chronic chest pain    occ  . ESRD (end stage renal disease) on dialysis ([place])    M-W-F  . GERD (gastroesophageal reflux disease)   . HNP (herniated nucleus pulposus), lumbar   . HTN (hypertension)    echo 3/10: EF 60%, LAE  . Hyperlipidemia   . Nephrolithiasis    "passed them all"  . Peripheral arterial disease ([place])    a. s/p PTCA to L SFA.  Marland Kitchen Pneumonia yrs ago  . Restless legs   . Sciatic leg pain   . Sleep apnea    no cpap, needs to reschedule appointment to set up aquiring cpap  . Snores    a. presumed OSA, pt has refused sleep eval in past.  . Type II diabetes mellitus ([place])    no longer on medications, checks blood glucose at home  . Urinary frequency    Past Surgical History:  Procedure Laterality Date  . AMPUTATION Right 07/25/2019   Procedure: RIGHT 5th RAY AMPUTATION;  Surgeon: Newt Minion, MD;  Location: [place];  Service: Orthopedics;  Laterality: Right;  . ANGIOPLASTY / STENTING FEMORAL Left 12/11/2013   dr berry  . AV FISTULA PLACEMENT Left 03/19/2014   Procedure: CREATION OF ARTERIOVENOUS (AV) FISTULA  LEFT UPPER ARM;  Surgeon: Mal Misty, MD;  [facility];  Service: Vascular;  Laterality: Left;  . BACK SURGERY  01/2018   screws placed   . CARDIAC CATHETERIZATION  2001 and 2010   . COLONOSCOPY W/ BIOPSIES AND POLYPECTOMY    . COLONOSCOPY WITH PROPOFOL N/A 08/01/2016   Procedure: COLONOSCOPY WITH PROPOFOL;  Surgeon: Carol Ada, MD;  Location: [facility];  Service: Endoscopy;  Laterality: N/A;  . CORONARY ARTERY BYPASS GRAFT  2019   [facility] x 1 bypass  . ESOPHAGOGASTRODUODENOSCOPY (EGD) WITH PROPOFOL N/A 08/01/2016   Procedure: ESOPHAGOGASTRODUODENOSCOPY (EGD) WITH PROPOFOL;  Surgeon: Carol Ada, MD;  [facility]: [facility];  Service: Endoscopy;  Laterality: N/A;  . FOOT FRACTURE SURGERY Right     ligament repair  . FRACTURE SURGERY     left forearm  . GRAFT APPLICATION Right 12/15/2950   Procedure: FAT GRAFT APPLICATION;  Surgeon: Evelina Bucy, DPM;  Location: [place];  Service: Podiatry;  Laterality: Right;  . INGUINAL HERNIA REPAIR Left   . LEFT HEART CATH AND CORS/GRAFTS ANGIOGRAPHY N/A 04/27/2017   Procedure: LEFT HEART CATH AND CORS/GRAFTS ANGIOGRAPHY;  Surgeon: Leonie Man, MD;  Location: [place] [facility];  Service: Cardiovascular;  Laterality: N/A;  . LEFT HEART CATHETERIZATION WITH CORONARY ANGIOGRAM N/A 06/22/2014   Procedure: LEFT HEART CATHETERIZATION WITH CORONARY ANGIOGRAM;  Surgeon: Troy Sine, MD;  Location: [facility] CATH LAB;  Service: Cardiovascular;  Laterality: N/A;  . LOWER EXTREMITY ANGIOGRAM Left 12/11/2013   Procedure: LOWER EXTREMITY ANGIOGRAM;  Surgeon: Lorretta Harp, MD;  Location: [facility] CATH LAB;  Service: Cardiovascular;  Laterality: Left;  . LUMBAR LAMINECTOMY/DECOMPRESSION MICRODISCECTOMY Right 07/03/2017   Procedure: MICRODISCECTOMY LUMBAR FIVE - SACRAL ONE RIGHT;  Surgeon: Consuella Lose, MD;  Location: [place];  Service: Neurosurgery;  Laterality: Right;  . LUMBAR LAMINECTOMY/DECOMPRESSION MICRODISCECTOMY Right 10/19/2017   Procedure: MICRODISCECTOMY LUMBAR FIVE- SACRAL 1 ONE ;  Surgeon: Consuella Lose, MD;  Location: [place];  Service: Neurosurgery;  Laterality: Right;  . TONSILLECTOMY AND ADENOIDECTOMY    . WISDOM TOOTH EXTRACTION    . WOUND DEBRIDEMENT Right 05/13/2019   Procedure: DEBRIDEMENT WOUND;  Surgeon: Evelina Bucy, DPM;  Location: [facility];  Service: Podiatry;  Laterality: Right;    Family History  Problem Relation Age of Onset  . Heart attack Sister        died @ 52  . Cancer Mother        died @ 67; unknown type  . Diabetes Brother        deceased  . Cirrhosis Father        alcohol related  . Diabetes Father   . Esophageal cancer Neg Hx   . Colon cancer Neg Hx   . Pancreatic cancer  Neg Hx   . Stomach cancer Neg Hx     SOCIAL HISTORY: Social History   Socioeconomic History  . Marital status: Married    Spouse name: Not on file  . Number of children: Not on file  . Years of education: Not on file  . Highest education level: Not on file  Occupational History  . Occupation: retired  Tobacco Use  . Smoking status: Former Smoker    Packs/day: 1.00    Years: 2.00    Pack years: 2.00    Types: Cigarettes    Quit date: 1976    Years since quitting: 45.3  . Smokeless tobacco: Never Used  . Tobacco comment: quit smoking 40 yrs ago  Substance and Sexual Activity  . Alcohol  use: No    Alcohol/week: 0.0 standard drinks    Comment: h/o social drinking  . Drug use: Not Currently    Types: Marijuana  . Sexual activity: Yes    Birth control/protection: None  Other Topics Concern  . Not on file  Social History Narrative   The patient is a Retail buyer.  He is married and has 2 grown children.  Lives in [place] with his wife.  Denies tobacco, alcohol or IV drug abuse or marijuana or cocaine intake.    Social Determinants of Health   Financial Resource Strain:   . Difficulty of Paying Living Expenses:   Food Insecurity:   . Worried About Charity fundraiser in the Last Year:   . Arboriculturist in the Last Year:   Transportation Needs:   . Film/video editor (Medical):   Marland Kitchen Lack of Transportation (Non-Medical):   Physical Activity:   . Days of Exercise per Week:   . Minutes of Exercise per Session:   Stress:   . Feeling of Stress :   Social Connections:   . Frequency of Communication with Friends and Family:   . Frequency of Social Gatherings with Friends and Family:   . Attends Religious Services:   . Active Member of Clubs or Organizations:   . Attends Archivist Meetings:   Marland Kitchen Marital Status:   Intimate Partner Violence:   . Fear of Current or Ex-Partner:   . Emotionally Abused:   Marland Kitchen Physically Abused:   . Sexually Abused:     Allergies    Allergen Reactions  . Kiwi Extract Itching, Swelling and Other (See Comments)    Lips and face swell- breathing not affected  . Flexeril [Cyclobenzaprine]     Hands become flimsy, can not hold things  . Tape Other (See Comments)    "Plastic" tape causes blisters!!    Current Outpatient Medications  Medication Sig Dispense Refill  . acetaminophen (TYLENOL) 500 MG tablet Take 1,000 mg by mouth every 4 (four) hours as needed for moderate pain or headache.     Marland Kitchen aspirin EC 81 MG tablet Take 1 tablet (81 mg total) by mouth daily.    Marland Kitchen atorvastatin (LIPITOR) 40 MG tablet Take 1 tablet (40 mg total) by mouth daily at 6 PM. 30 tablet 5  . calcium acetate (PHOSLO) 667 MG capsule Take 3 capsules (2,001 mg total) by mouth 3 (three) times daily with meals. (Patient taking differently: Take 1,334 mg by mouth 2 (two) times daily. ) 270 capsule 0  . clopidogrel (PLAVIX) 75 MG tablet Take 75 mg by mouth daily.    . clotrimazole (LOTRIMIN) 1 % cream Apply between toes and under toes twice daily for 4 weeks. (Patient taking differently: Apply 1 application topically daily. Apply between toes and under toes twice daily for 4 weeks.) 30 g 1  . gabapentin (NEURONTIN) 100 MG capsule Take 100 mg by mouth 2 (two) times daily.     . multivitamin (RENA-VIT) TABS tablet Take 1 tablet by mouth at bedtime. 30 tablet 0  . nitroGLYCERIN (NITRODUR - DOSED IN MG/24 HR) 0.2 mg/hr patch Place 1 patch (0.2 mg total) onto the skin daily. 30 patch 12  . nitroGLYCERIN (NITROSTAT) 0.4 MG SL tablet Place 1 tablet (0.4 mg total) under the tongue every 5 (five) minutes as needed for chest pain. 25 tablet 3  . ONETOUCH ULTRA test strip     . pantoprazole (PROTONIX) 40 MG tablet Take 40 mg  by mouth daily.    . polyethylene glycol (MIRALAX / GLYCOLAX) packet Take 17 g by mouth daily as needed for moderate constipation. 14 each 0  . senna-docusate (SENOKOT-S) 8.6-50 MG tablet Take 2 tablets by mouth 2 (two) times daily as needed for  mild constipation. 60 tablet 1  . sucralfate (CARAFATE) 1 g tablet Take 1 g by mouth 2 (two) times daily.     No current facility-administered medications for this visit.    ROS:   General:  No weight loss, Fever, chills  HEENT: No recent headaches, no nasal bleeding, no visual changes, no sore throat  Neurologic: No dizziness, blackouts, seizures. No recent symptoms of stroke or mini- stroke. No recent episodes of slurred speech, or temporary blindness.  Cardiac: No recent episodes of chest pain/pressure, no shortness of breath at rest.  No shortness of breath with exertion.  Denies history of atrial fibrillation or irregular heartbeat  Vascular: No history of rest pain in feet.  +history of claudication.  + history of non-healing ulcer, No history of DVT   Pulmonary: No home oxygen, no productive cough, no hemoptysis,  No asthma or wheezing  Musculoskeletal:  [ ]  Arthritis, [ ]  Low back pain,  [ ]  Joint pain  Hematologic:No history of hypercoagulable state.  No history of easy bleeding.  No history of anemia  Gastrointestinal: No hematochezia or melena,  No gastroesophageal reflux, no trouble swallowing  Urinary: [X]  chronic Kidney disease, [X]  on HD - [X]  MWF or [ ]  TTHS, [ ]  Burning with urination, [ ]  Frequent urination, [ ]  Difficulty urinating;   Skin: No rashes  Psychological: No history of anxiety,  No history of depression   Physical Examination  Vitals:   08/14/19 1013  BP: 125/69  Pulse: 77  Resp: 20  Temp: 97.7 F (36.5 C)  SpO2: 95%  Weight: 163 lb (73.9 kg)  Height: 5\' 10"  (1.778 m)    General:  Alert and oriented, no acute distress HEENT: Normal Neck: No JVD Cardiac: Regular Rate and Rhythm without murmur Abdomen: Soft, non-tender, non-distended, no mass, no scars Skin: No rash Extremity Pulses:  2+ radial, brachial, femoral, 2+ right popliteal absent left popliteal absent dorsalis pedis, posterior tibial pulses bilaterally Musculoskeletal: Trace  pedal edema bilaterally.  Healing right lateral fifth ray amputation site for millimeter dark discolored area tip of right first toe Neurologic: Upper and lower extremity motor 5/5 and symmetric  DATA: Patient had bilateral ABIs performed today.  Right side was 0.69 left side was 1.25 left great toe pressure was 63 right toe pressure was 30 waveforms were monophasic on the right triphasic on the left  ASSESSMENT: Peripheral arterial disease bilateral lower extremities with worsening discoloration tip of right first toe.  So far the ray amputation is healing so hopefully he has adequate perfusion for this to heal.  However the new lesion on his toe is worrisome   PLAN: Aortogram lower extremity runoff possible intervention by my partner Dr. Donzetta Matters Aug 21, 2019 so that we avoid his dialysis day.  Risk benefits possible complications of procedure details were discussed with patient today include not limited to bleeding infection contrast reaction vessel injury.  He understands and agrees to proceed.   Ruta Hinds, MD [facility] Office: [phone] Pager: [phone]

## 2019-08-18 ENCOUNTER — Other Ambulatory Visit (HOSPITAL_COMMUNITY)
Admission: RE | Admit: 2019-08-18 | Discharge: 2019-08-18 | Disposition: A | Discharge status: Home / Self Care | Payer: Medicare Other | Source: Ambulatory Visit | Attending: Vascular Surgery | Admitting: Vascular Surgery

## 2019-08-18 DIAGNOSIS — Z20822 Contact with and (suspected) exposure to covid-19: Secondary | ICD-10-CM | POA: Insufficient documentation

## 2019-08-18 DIAGNOSIS — Z01812 Encounter for preprocedural laboratory examination: Secondary | ICD-10-CM | POA: Insufficient documentation

## 2019-08-18 LAB — SARS CORONAVIRUS 2 (TAT 6-24 HRS): SARS Coronavirus 2: NEGATIVE

## 2019-08-21 ENCOUNTER — Other Ambulatory Visit: Payer: Self-pay

## 2019-08-21 ENCOUNTER — Ambulatory Visit (HOSPITAL_COMMUNITY)
Admission: RE | Disposition: A | Discharge status: Home / Self Care | Payer: Self-pay | Source: Home / Self Care | Attending: Vascular Surgery

## 2019-08-21 ENCOUNTER — Ambulatory Visit (HOSPITAL_COMMUNITY)
Admission: RE | Admit: 2019-08-21 | Discharge: 2019-08-21 | Disposition: A | Discharge status: Home / Self Care | Payer: Medicare Other | Attending: Vascular Surgery | Admitting: Vascular Surgery

## 2019-08-21 DIAGNOSIS — Z8249 Family history of ischemic heart disease and other diseases of the circulatory system: Secondary | ICD-10-CM | POA: Insufficient documentation

## 2019-08-21 DIAGNOSIS — I2510 Atherosclerotic heart disease of native coronary artery without angina pectoris: Secondary | ICD-10-CM | POA: Diagnosis not present

## 2019-08-21 DIAGNOSIS — Z951 Presence of aortocoronary bypass graft: Secondary | ICD-10-CM | POA: Diagnosis not present

## 2019-08-21 DIAGNOSIS — E785 Hyperlipidemia, unspecified: Secondary | ICD-10-CM | POA: Insufficient documentation

## 2019-08-21 DIAGNOSIS — N186 End stage renal disease: Secondary | ICD-10-CM | POA: Diagnosis not present

## 2019-08-21 DIAGNOSIS — Z7902 Long term (current) use of antithrombotics/antiplatelets: Secondary | ICD-10-CM | POA: Insufficient documentation

## 2019-08-21 DIAGNOSIS — I6523 Occlusion and stenosis of bilateral carotid arteries: Secondary | ICD-10-CM | POA: Diagnosis not present

## 2019-08-21 DIAGNOSIS — G2581 Restless legs syndrome: Secondary | ICD-10-CM | POA: Diagnosis not present

## 2019-08-21 DIAGNOSIS — K219 Gastro-esophageal reflux disease without esophagitis: Secondary | ICD-10-CM | POA: Diagnosis not present

## 2019-08-21 DIAGNOSIS — I70235 Atherosclerosis of native arteries of right leg with ulceration of other part of foot: Secondary | ICD-10-CM | POA: Diagnosis not present

## 2019-08-21 DIAGNOSIS — Z87891 Personal history of nicotine dependence: Secondary | ICD-10-CM | POA: Diagnosis not present

## 2019-08-21 DIAGNOSIS — Z888 Allergy status to other drugs, medicaments and biological substances status: Secondary | ICD-10-CM | POA: Insufficient documentation

## 2019-08-21 DIAGNOSIS — Z7982 Long term (current) use of aspirin: Secondary | ICD-10-CM | POA: Diagnosis not present

## 2019-08-21 DIAGNOSIS — E1122 Type 2 diabetes mellitus with diabetic chronic kidney disease: Secondary | ICD-10-CM | POA: Insufficient documentation

## 2019-08-21 DIAGNOSIS — Z955 Presence of coronary angioplasty implant and graft: Secondary | ICD-10-CM | POA: Insufficient documentation

## 2019-08-21 DIAGNOSIS — I70212 Atherosclerosis of native arteries of extremities with intermittent claudication, left leg: Secondary | ICD-10-CM | POA: Diagnosis not present

## 2019-08-21 DIAGNOSIS — L97519 Non-pressure chronic ulcer of other part of right foot with unspecified severity: Secondary | ICD-10-CM | POA: Insufficient documentation

## 2019-08-21 DIAGNOSIS — Z79899 Other long term (current) drug therapy: Secondary | ICD-10-CM | POA: Diagnosis not present

## 2019-08-21 DIAGNOSIS — I998 Other disorder of circulatory system: Secondary | ICD-10-CM | POA: Diagnosis not present

## 2019-08-21 DIAGNOSIS — G4730 Sleep apnea, unspecified: Secondary | ICD-10-CM | POA: Diagnosis not present

## 2019-08-21 DIAGNOSIS — I120 Hypertensive chronic kidney disease with stage 5 chronic kidney disease or end stage renal disease: Secondary | ICD-10-CM | POA: Diagnosis not present

## 2019-08-21 DIAGNOSIS — E11621 Type 2 diabetes mellitus with foot ulcer: Secondary | ICD-10-CM | POA: Insufficient documentation

## 2019-08-21 DIAGNOSIS — Z89421 Acquired absence of other right toe(s): Secondary | ICD-10-CM | POA: Insufficient documentation

## 2019-08-21 DIAGNOSIS — Z992 Dependence on renal dialysis: Secondary | ICD-10-CM | POA: Diagnosis not present

## 2019-08-21 HISTORY — PX: ABDOMINAL AORTOGRAM W/LOWER EXTREMITY: CATH118223

## 2019-08-21 LAB — POCT I-STAT, CHEM 8
BUN: 18 mg/dL (ref 8–23)
Calcium, Ion: 1.07 mmol/L — ABNORMAL LOW (ref 1.15–1.40)
Chloride: 94 mmol/L — ABNORMAL LOW (ref 98–111)
Creatinine, Ser: 7.1 mg/dL — ABNORMAL HIGH (ref 0.61–1.24)
Glucose, Bld: 106 mg/dL — ABNORMAL HIGH (ref 70–99)
HCT: 36 % — ABNORMAL LOW (ref 39.0–52.0)
Hemoglobin: 12.2 g/dL — ABNORMAL LOW (ref 13.0–17.0)
Potassium: 3.5 mmol/L (ref 3.5–5.1)
Sodium: 138 mmol/L (ref 135–145)
TCO2: 37 mmol/L — ABNORMAL HIGH (ref 22–32)

## 2019-08-21 LAB — GLUCOSE, CAPILLARY: Glucose-Capillary: 70 mg/dL (ref 70–99)

## 2019-08-21 SURGERY — ABDOMINAL AORTOGRAM W/LOWER EXTREMITY
Anesthesia: LOCAL | Laterality: Bilateral

## 2019-08-21 MED ORDER — HEPARIN (PORCINE) IN NACL 1000-0.9 UT/500ML-% IV SOLN
INTRAVENOUS | Status: AC
  Filled 2019-08-21: qty 500

## 2019-08-21 MED ORDER — HEPARIN SODIUM (PORCINE) 1000 UNIT/ML IJ SOLN
INTRAMUSCULAR | Status: AC
  Filled 2019-08-21: qty 1

## 2019-08-21 MED ORDER — IODIXANOL 320 MG/ML IV SOLN
INTRAVENOUS | Status: DC | PRN
  Administered 2019-08-21: 240 mL via INTRA_ARTERIAL

## 2019-08-21 MED ORDER — CLOPIDOGREL BISULFATE 300 MG PO TABS
ORAL_TABLET | ORAL | Status: DC | PRN
  Administered 2019-08-21: 300 mg via ORAL

## 2019-08-21 MED ORDER — SODIUM CHLORIDE 0.9 % IV SOLN: 250.0000 mL | INTRAVENOUS | Status: DC | PRN

## 2019-08-21 MED ORDER — HEPARIN SODIUM (PORCINE) 1000 UNIT/ML IJ SOLN
INTRAMUSCULAR | Status: DC | PRN
  Administered 2019-08-21: 5000 [IU] via INTRAVENOUS
  Administered 2019-08-21: 7000 [IU] via INTRAVENOUS

## 2019-08-21 MED ORDER — ACETAMINOPHEN 325 MG PO TABS: 650.0000 mg | ORAL_TABLET | ORAL | Status: DC | PRN

## 2019-08-21 MED ORDER — MIDAZOLAM HCL 2 MG/2ML IJ SOLN
INTRAMUSCULAR | Status: AC
  Filled 2019-08-21: qty 2

## 2019-08-21 MED ORDER — MORPHINE SULFATE (PF) 2 MG/ML IV SOLN: 2.0000 mg | INTRAVENOUS | Status: DC | PRN

## 2019-08-21 MED ORDER — SODIUM CHLORIDE 0.9% FLUSH: 3.0000 mL | INTRAVENOUS | Status: DC | PRN

## 2019-08-21 MED ORDER — LABETALOL HCL 5 MG/ML IV SOLN: 10.0000 mg | INTRAVENOUS | Status: DC | PRN

## 2019-08-21 MED ORDER — LIDOCAINE HCL (PF) 1 % IJ SOLN
INTRAMUSCULAR | Status: AC
  Filled 2019-08-21: qty 30

## 2019-08-21 MED ORDER — HYDRALAZINE HCL 20 MG/ML IJ SOLN: 5.0000 mg | INTRAMUSCULAR | Status: DC | PRN

## 2019-08-21 MED ORDER — FENTANYL CITRATE (PF) 100 MCG/2ML IJ SOLN
INTRAMUSCULAR | Status: AC
  Filled 2019-08-21: qty 2

## 2019-08-21 MED ORDER — SODIUM CHLORIDE 0.9% FLUSH: 3.0000 mL | Freq: Two times a day (BID) | INTRAVENOUS | Status: DC

## 2019-08-21 MED ORDER — ONDANSETRON HCL 4 MG/2ML IJ SOLN: 4.0000 mg | Freq: Four times a day (QID) | INTRAMUSCULAR | Status: DC | PRN

## 2019-08-21 MED ORDER — OXYCODONE HCL 5 MG PO TABS: 5.0000 mg | ORAL_TABLET | ORAL | Status: DC | PRN

## 2019-08-21 MED ORDER — CLOPIDOGREL BISULFATE 300 MG PO TABS
ORAL_TABLET | ORAL | Status: AC
  Filled 2019-08-21: qty 1

## 2019-08-21 MED ORDER — FENTANYL CITRATE (PF) 100 MCG/2ML IJ SOLN
INTRAMUSCULAR | Status: DC | PRN
  Administered 2019-08-21: 25 ug via INTRAVENOUS
  Administered 2019-08-21 (×3): 50 ug via INTRAVENOUS

## 2019-08-21 MED ORDER — LIDOCAINE HCL (PF) 1 % IJ SOLN
INTRAMUSCULAR | Status: DC | PRN
  Administered 2019-08-21: 15 mL via INTRADERMAL
  Administered 2019-08-21: 2 mL via INTRADERMAL

## 2019-08-21 MED ORDER — MIDAZOLAM HCL 2 MG/2ML IJ SOLN
INTRAMUSCULAR | Status: DC | PRN
  Administered 2019-08-21 (×2): 1 mg via INTRAVENOUS

## 2019-08-21 MED ORDER — HEPARIN (PORCINE) IN NACL 1000-0.9 UT/500ML-% IV SOLN
INTRAVENOUS | Status: DC | PRN
  Administered 2019-08-21 (×2): 500 mL

## 2019-08-21 MED ORDER — SODIUM CHLORIDE 0.9 % IV SOLN: INTRAVENOUS | Status: DC

## 2019-08-21 SURGICAL SUPPLY — 36 items
BALL STERLING OTW 2.5X150X150 (BALLOONS) ×2
BALLN MUSTANG 5X60X135 (BALLOONS) ×2
BALLN STERLI SL OTW 2.5X80X150 (BALLOONS) ×2
BALLN STERLING OTW 2.5X150X150 (BALLOONS) ×1
BALLN STERLING OTW 3X220X150 (BALLOONS) ×2
BALLOON MUSTANG 5X60X135 (BALLOONS) IMPLANT
BALLOON STERLING OTW 3X220X150 (BALLOONS) IMPLANT
BALLOON STRLNG OTW 2.5X150X150 (BALLOONS) IMPLANT
BALLOON STRLNG OTW 2.5X80X150 (BALLOONS) IMPLANT
CATH CROSS OVER TEMPO 5F (CATHETERS) ×1 IMPLANT
CATH CXI 2.6F 65 ANG (CATHETERS) ×2
CATH NAVICROSS ANGLED 135CM (MICROCATHETER) ×1 IMPLANT
CATH OMNI FLUSH 5F 65CM (CATHETERS) ×1 IMPLANT
CATH QUICKCROSS .035X135CM (MICROCATHETER) ×1 IMPLANT
CATH QUICKCROSS ANG SELECT (CATHETERS) IMPLANT
CATH SPRT ANG 65X2.3FR PLATN (CATHETERS) IMPLANT
CLOSURE MYNX CONTROL 6F/7F (Vascular Products) ×1 IMPLANT
DEVICE ONE SNARE 10MM (MISCELLANEOUS) ×1 IMPLANT
GLIDEWIRE ADV .035X260CM (WIRE) ×1 IMPLANT
GLIDEWIRE ANGLED NITR .018X260 (WIRE) ×1 IMPLANT
GUIDEWIRE ZILIENT 12G 018 (WIRE) ×1 IMPLANT
KIT ENCORE 26 ADVANTAGE (KITS) ×1 IMPLANT
KIT MICROPUNCTURE NIT STIFF (SHEATH) ×1 IMPLANT
KIT PV (KITS) ×2 IMPLANT
PATCH THROMBIX TOPICAL PLAIN (HEMOSTASIS) ×1 IMPLANT
SHEATH HIGHFLEX ANSEL 6FRX55 (SHEATH) ×1 IMPLANT
SHEATH MICROPUNCTURE PEDAL 4FR (SHEATH) ×1 IMPLANT
SHEATH PINNACLE 5F 10CM (SHEATH) ×1 IMPLANT
SHEATH PINNACLE 6F 10CM (SHEATH) ×1 IMPLANT
STENT TIGRIS 5X60X120 (Permanent Stent) ×1 IMPLANT
SYR MEDRAD MARK V 150ML (SYRINGE) ×1 IMPLANT
TRANSDUCER W/STOPCOCK (MISCELLANEOUS) ×2 IMPLANT
TRAY PV CATH (CUSTOM PROCEDURE TRAY) ×2 IMPLANT
WIRE BENTSON .035X145CM (WIRE) ×1 IMPLANT
WIRE G V18X300CM (WIRE) ×4 IMPLANT
WIRE HI TORQ COMMND ES.014X300 (WIRE) ×1 IMPLANT

## 2019-08-21 NOTE — Progress Notes (Signed)
Discharge instructions reviewed with pt and his wife (via telephone) both voice understanding.  

## 2019-08-21 NOTE — H&P (Signed)
   History and Physical Update  The patient was interviewed and re-examined.  The patient's previous History and Physical has been reviewed and is unchanged from recent office visit. Plan for aortogram with bilateral runoff and possible intervention.   Carleen Rhue C. Donzetta Matters, MD [facility] [place] Office: [phone] Pager: [phone]  08/21/2019, 7:10 AM

## 2019-08-21 NOTE — Op Note (Signed)
Patient name: Jon Anderson MRN: [id number] DOB: 09-26-1950 Sex: male  08/21/2019 Pre-operative Diagnosis: Critical right lower extremity ischemia Post-operative diagnosis:  Same Surgeon:  Erlene Quan C. Donzetta Matters, MD Procedure Performed: 1.  Ultrasound-guided cannulation left common femoral artery 2.  Aortogram with bilateral lower extremity runoff 3.  2.5 millimeters balloon angioplasty of right posterior tibial artery 4.  Ultrasound-guided cannulation right dorsalis pedis artery 5.  Balloon angioplasty of right anterior tibial artery with 2.5 and 3.0 mm balloons 6.  Stent of right popliteal artery with 5 x 60 mm Tigris 7.  Minx device closure left common femoral artery 8.  Moderate sedation with fentanyl Versed for 162 minutes   Indications: 69 year old male with history of left SFA intervention for claudication.  Undergone right fifth toe amputation has an ulcer on the right great toe.  He is indicated for angiography with possible intervention.  Findings: The aorta and iliac segments were free of flow-limiting stenosis however they were very tortuous at the site of previous back instrumentation.  Left side the SFA popliteal arteries appear patent.  Imaging was not time to evaluate contrast in the tibial arteries on the left.  The right side which is the site of interest there was a patent SFA up until approximately 80% heavily calcified 1 cm stenosis in the popliteal artery.  The anterior tibial artery occluded just after the takeoff and reconstituted after approximately 150 mm occlusion.  The TP trunk was patent.  The peroneal artery was to the mid ankle and was patent in the hallway but gave out there.  Posterior tibial artery was patent just distal to the ankle where it had a 60 mm lesion that was 90% stenosis.  After treatment posterior tibial artery we now had no residual stenosis or occlusion.  We could not get antegrade through the anterior tibial artery and elected to go retrograde.   After completion we did have minor dissection but was not flow-limiting.  I elected not to stent the takeoff of the anterior tibial artery given that it appeared that there were 2 small channels I did not want to shut one of them down.  After stenting in the popliteal artery there was 0% residual stenosis.  Hopefully this will be enough blood flow to heal his foot.   Procedure:  The patient was identified in the holding area and taken to room 8.  The patient was then placed supine on the table and prepped and draped in the usual sterile fashion.  A time out was called.  Ultrasound was used to evaluate the left common femoral artery.  This was calcified but not diseased otherwise with stenosis.  I anesthetized the area with 1% lidocaine cannulated micropuncture needle followed the wire and sheath.  And images saved the permanent record.  Moderate sedation with fentanyl and Versed was administered his vital signs were monitored throughout the case.  We placed a Bentson wire to the level of the aorta place 5 French sheath and Omni catheter to the level of L1 performed aortogram followed bilateral lower extremity runoff.  We then crossed the bifurcation we used Glidewire advantage and crossover catheter.  I placed a quick cross catheter into the right SFA performed imaging of the popliteal artery which demonstrated a high-grade stenosis and also the tibial arteries with the above-noted stenosis.  I then exchanged for a long 6 [place] sheath patient was fully heparinized.  We used V 18 wire and quick cross catheter to get through the stenosis in  the posterior tibial artery.  This was then primarily balloon.  Completion demonstrated no residual stenosis.  We then used combination of quick cross elect catheters and Ferd Hibbs cross catheter to select the anterior tibial artery.  I used a combination of the 18 wires as well as Xilient 1 8 wire but could not get through antegrade.  With this the right foot was prepped.  Use  ultrasound to identify the right dorsalis pedis artery this was cannulated micropuncture needle followed wire and sheath.  The area was then anesthetized.  An image saved the permanent record.  Using a combination of V 18 and Glidewire 018 we were able to get retrograde through the anterior tibial artery.  This was then snared and pulled through and through.  We then ballooned the anterior tibial artery and multiple times and locations with 2.5 mm balloon initially at nominal pressure followed by low pressure and then 3 mm balloon initially at nominal pressure followed by low pressure.  There remained a small dissection but I elected not to stent this also there was no flow limitation.  We turned our attention to the popliteal artery.  I attempted with through and through access to deploy [place] stent at the popliteal stenosis.  Unfortunately this would not deploy of the 018 wire this was removed we follow this throughout this removed in total.  We then exchanged for an 035 wire was placed into the TP trunk with what a new Tigris 5 x 60 mm stent.  This was postdilated with 5 mm balloon.  While this was in place and inflated we remove the anterior tibial artery sheath.  Pressure was held till hemostasis obtained.  Patient demonstrated no residual stenosis at the popliteal artery.  We then exchanged for a short 6 French sheath deployed a minx device.  He tolerated procedure without immediate complication.  Contrast: 240 cc    [name] C. Donzetta Matters, MD [facility] [place] Office: [phone] Pager: [phone]

## 2019-08-21 NOTE — Discharge Instructions (Signed)
Femoral Site Care This sheet gives you information about how to care for yourself after your procedure. Your health care provider may also give you more specific instructions. If you have problems or questions, contact your health care provider. What can I expect after the procedure? After the procedure, it is common to have:  Bruising that usually fades within 1-2 weeks.  Tenderness at the site. Follow these instructions at home: Wound care  Follow instructions from your health care provider about how to take care of your insertion site. Make sure you: ? Wash your hands with soap and water before you change your bandage (dressing). If soap and water are not available, use hand sanitizer. ? Change your dressing as told by your health care provider. ? Leave stitches (sutures), skin glue, or adhesive strips in place. These skin closures may need to stay in place for 2 weeks or longer. If adhesive strip edges start to loosen and curl up, you may trim the loose edges. Do not remove adhesive strips completely unless your health care provider tells you to do that.  Do not take baths, swim, or use a hot tub until your health care provider approves.  You may shower 24-48 hours after the procedure or as told by your health care provider. ? Gently wash the site with plain soap and water. ? Pat the area dry with a clean towel. ? Do not rub the site. This may cause bleeding.  Do not apply powder or lotion to the site. Keep the site clean and dry.  Check your femoral site every day for signs of infection. Check for: ? Redness, swelling, or pain. ? Fluid or blood. ? Warmth. ? Pus or a bad smell. Activity  For the first 2-3 days after your procedure, or as long as directed: ? Avoid climbing stairs as much as possible. ? Do not squat.  Do not lift anything that is heavier than 10 lb (4.5 kg), or the limit that you are told, until your health care provider says that it is safe.  Rest as  directed. ? Avoid sitting for a long time without moving. Get up to take short walks every 1-2 hours.  Do not drive for 24 hours if you were given a medicine to help you relax (sedative). General instructions  Take over-the-counter and prescription medicines only as told by your health care provider.  Keep all follow-up visits as told by your health care provider. This is important. Contact a health care provider if you have:  A fever or chills.  You have redness, swelling, or pain around your insertion site. Get help right away if:  The catheter insertion area swells very fast.  You pass out.  You suddenly start to sweat or your skin gets clammy.  The catheter insertion area is bleeding, and the bleeding does not stop when you hold steady pressure on the area.  The area near or just beyond the catheter insertion site becomes pale, cool, tingly, or numb. These symptoms may represent a serious problem that is an emergency. Do not wait to see if the symptoms will go away. Get medical help right away. Call your local emergency services (911 in the [place] Do not drive yourself to the hospital. Summary  After the procedure, it is common to have bruising that usually fades within 1-2 weeks.  Check your femoral site every day for signs of infection.  Do not lift anything that is heavier than 10 lb (4.5 kg), or the   limit that you are told, until your health care provider says that it is safe. This information is not intended to replace advice given to you by your health care provider. Make sure you discuss any questions you have with your health care provider. Document Revised: 04/09/2017 Document Reviewed: 04/09/2017 Elsevier Patient Education  2020 [employer] Inc[employer]  

## 2019-08-21 NOTE — Progress Notes (Signed)
At 1100 check small amt of bleeding noted to left groin site. Pressure held for 20 minutes and area marked. No further bleeding noted

## 2019-08-22 MED FILL — Heparin Sod (Porcine)-NaCl IV Soln 1000 Unit/500ML-0.9%: INTRAVENOUS | Qty: 500 | Status: AC

## 2019-08-25 IMAGING — RF DG C-ARM 61-120 MIN
1 series · 2 of 2 positions shown · non-contrast
Comparison: Lumbosacral spine radiograph 02/17/2018

CLINICAL DATA: L4-S1 posterior fusion.

EXAM:
DG C-ARM 61-120 MIN; LUMBAR SPINE - 2-3 VIEW

[Series 1: run · 2 of 2 slices shown]
[im 1/2]
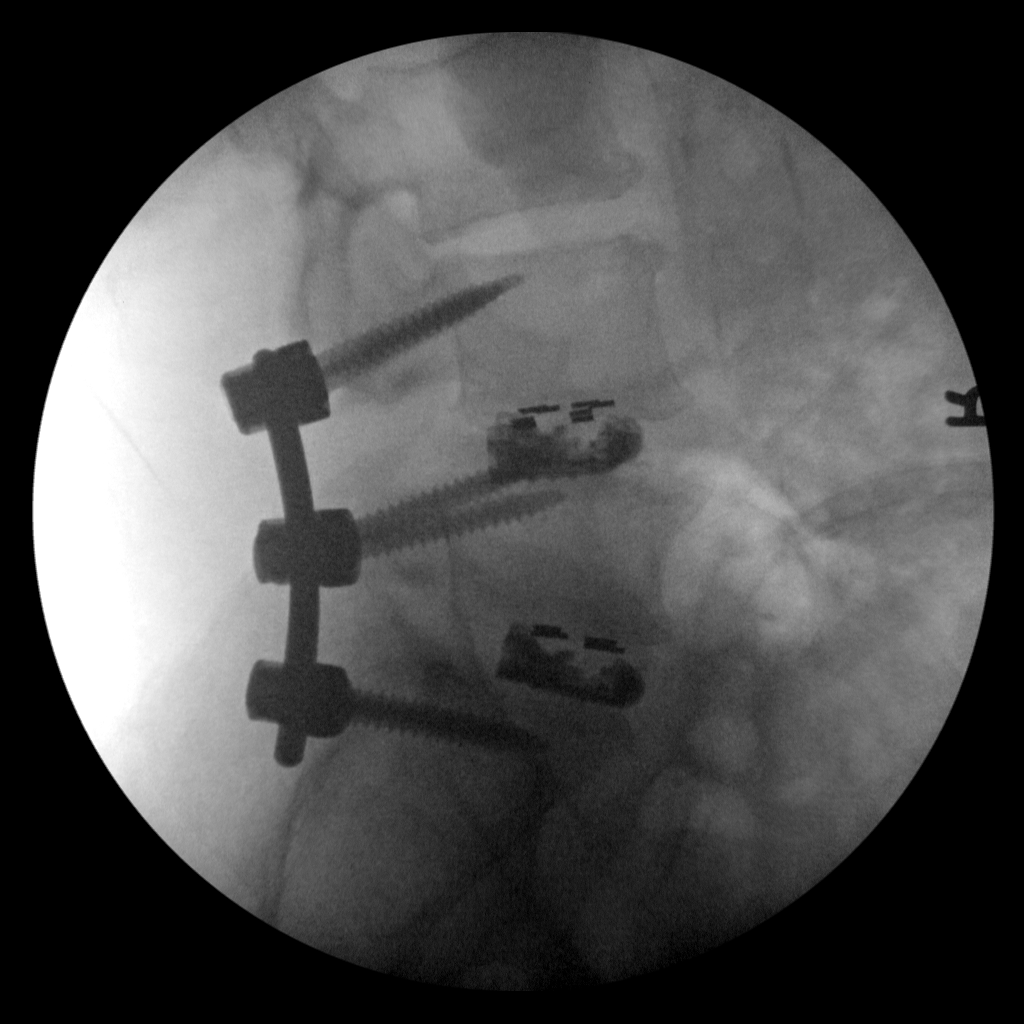
[im 2/2]
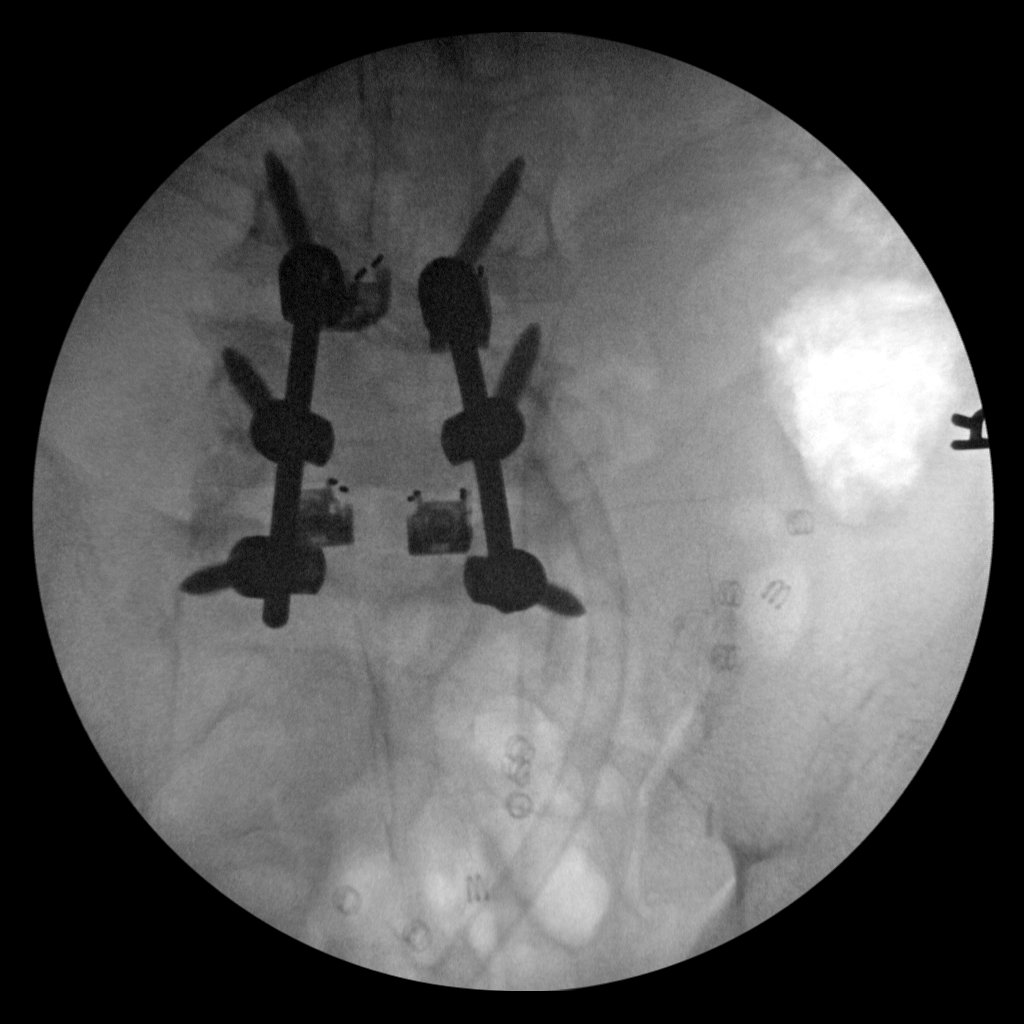

[2 of 2 positions shown; findings below may reference images not displayed]

FINDINGS: Post posterior and anterior fusion at L4-S1 with normal alignment of
the orthopedic hardware and osseous structures. No evidence of
fracture.
IMPRESSION: Post L4-S1 PLIF without evidence of immediate complications.

## 2019-08-26 ENCOUNTER — Encounter: Payer: Self-pay | Admitting: Orthopedic Surgery

## 2019-08-26 ENCOUNTER — Other Ambulatory Visit: Payer: Self-pay

## 2019-08-26 ENCOUNTER — Ambulatory Visit (INDEPENDENT_AMBULATORY_CARE_PROVIDER_SITE_OTHER): Payer: Medicare Other | Admitting: Physician Assistant

## 2019-08-26 VITALS — Ht 71.0 in | Wt 162.0 lb

## 2019-08-26 DIAGNOSIS — M869 Osteomyelitis, unspecified: Secondary | ICD-10-CM

## 2019-08-26 MED ORDER — PENTOXIFYLLINE ER 400 MG PO TBCR: 400.0000 mg | EXTENDED_RELEASE_TABLET | Freq: Every day | ORAL | 3 refills | Status: DC

## 2019-08-26 NOTE — Progress Notes (Signed)
Office Visit Note   Patient: Jon Anderson           Date of Birth: 01-25-1951           MRN: [id number] Visit Date: 08/26/2019              Requested by: [name], MD [id number] [place],  [place] [address] PCP: [name], MD  Chief Complaint  Patient presents with  . 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  He had vascular procedure last week with angioplasty.  He feels his foot is about the same.  He has not been using the nitroglycerin patch regularly because he said when he has dialysis it seems to worsen his neuropathy  Assessment & Plan: Visit Diagnoses: No diagnosis found.  Plan: I will prescribe Trental once daily.  I also encouraged him to try and use the patch.  Follow-up in 2 weeks.  Follow-Up Instructions: No follow-ups on file.   Ortho Exam  Patient is alert, oriented, no adenopathy, well-dressed, normal affect, normal respiratory effort. Focused examination of his lateral wound he still has some tenderness and healing at the very distal end of the wound of the ray amputation.  No surrounding erythema or cellulitis I cannot appreciate any foul odor.  He does have some slight swelling his great toe has a necrotic tip with some mild swelling.  I did think this remains unchanged from previous exam no surrounding cellulitis or skin breakdown  Imaging: No results found. No images are attached to the encounter.  Labs: Lab Results  Component Value Date   HGBA1C 6.4 (H) 07/24/2019   HGBA1C 6.5 (H) 07/18/2018   HGBA1C 6.4 (H) 10/16/2017   ESRSEDRATE 22 (H) 07/24/2019   CRP <0.5 07/24/2019   REPTSTATUS 07/29/2019 FINAL 07/24/2019   CULT  07/24/2019    NO GROWTH 5 DAYS Performed at [facility], [place] [address] [place], [place] [address]      Lab Results  Component Value Date   ALBUMIN 3.7 07/24/2019    ALBUMIN 3.2 (L) 04/24/2019   ALBUMIN 3.7 11/04/2018   PREALBUMIN 28.0 07/24/2019    Lab Results  Component Value Date   MG 1.8 04/26/2019   MG 1.8 04/24/2019   MG 2.1 03/22/2017   No results found for: VD25OH  Lab Results  Component Value Date   PREALBUMIN 28.0 07/24/2019   CBC EXTENDED Latest Ref Rng & Units 08/21/2019 07/25/2019 07/24/2019  WBC 4.0 - 10.5 K/uL - 5.0 5.5  RBC 4.22 - 5.81 MIL/uL - 3.96(L) 4.37  HGB 13.0 - 17.0 g/dL 12.2(L) 11.5(L) 12.3(L)  HCT 39.0 - 52.0 % 36.0(L) 36.8(L) 40.2  PLT 150 - 400 K/uL - 235 259  NEUTROABS 1.7 - 7.7 K/uL - - 2.4  LYMPHSABS 0.7 - 4.0 K/uL - - 2.2     Body mass index is 22.59 kg/m.  Orders:  No orders of the defined types were placed in this encounter.  Meds ordered this encounter  Medications  . pentoxifylline (TRENTAL) 400 MG CR tablet    Sig: Take 1 tablet (400 mg total) by mouth daily.    Dispense:  90 tablet    Refill:  3     Procedures: No procedures performed  Clinical Data: No additional findings.  ROS:  All other systems  negative, except as noted in the HPI. Review of Systems  Objective: Vital Signs: Ht 5\' 11"  (1.803 m)   Wt 162 lb (73.5 kg)   BMI 22.59 kg/m   Specialty Comments:  No specialty comments available.  PMFS History: Patient Active Problem List   Diagnosis Date Noted  . Osteomyelitis of right foot ([place])   . Diabetic foot infection ([place]) 07/24/2019  . Chronic diastolic CHF (congestive heart failure) ([place]) 07/24/2019  . Diabetic ulcer of right midfoot associated with diabetes mellitus due to underlying condition, with fat layer exposed ([place])   . Gastroenteritis 04/24/2019  . Allergy, unspecified, initial encounter 01/27/2019  . DM neuropathy with neurologic complication ([place]) 92/33/0076  . GERD (gastroesophageal reflux disease) 02/28/2018  . Nephrolithiasis 02/28/2018  . Sciatic leg pain 02/28/2018  . Snores 02/28/2018  . Orthopnea 02/23/2018  . Elevated troponin 02/23/2018  . HNP  (herniated nucleus pulposus), lumbar 07/03/2017  . Encounter for removal of sutures 06/01/2017  . Chest pain in adult 04/27/2017  . Restless leg syndrome, uncontrolled 04/27/2017  . 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 Anemia of chronic disease 03/17/2017  . ESRD (end stage renal disease) on dialysis ([place]) 03/17/2017  . Chronic chest pain 03/17/2017  . Type II diabetes mellitus ([place]) 03/17/2017  . Acute on chronic diastolic heart failure ([place]) 03/17/2017  . Acute heart failure ([place]) 03/17/2017  . Lumbar radiculopathy 01/16/2017  . Pre-transplant evaluation for kidney transplant 06/15/2016  . Pain, unspecified 04/17/2016  . Increased frequency of urination 12/17/2015  . Nocturia 12/17/2015  . Chest pain, non-cardiac 10/09/2015  . Hypercalcemia 05/10/2015  . Other fluid overload 02/24/2015  . 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 Fatty (change of) liver, not elsewhere classified 07/02/2014  . Aftercare including intermittent dialysis ([place]) 06/24/2014  . Diarrhea, unspecified 06/24/2014  . Fever, unspecified 06/24/2014  . Iron deficiency anemia, unspecified 06/24/2014  . Other specified coagulation defects ([place]) 06/24/2014  . Pruritus, unspecified 06/24/2014  . Secondary hyperparathyroidism of renal origin ([place]) 06/24/2014  . Type 2 diabetes mellitus with diabetic peripheral angiopathy without gangrene ([place]) 06/24/2014  . Acute on chronic renal failure ([place]) 06/16/2014  . Shoulder pain, left 12/15/2013  . Chest pain 12/15/2013  . Claudication ([place]) 12/11/2013  . PVD (peripheral vascular disease) ([place]) 12/11/2013  . Carotid artery disease ([place]) 09/30/2013  . Acute chest pain 11/15/2012  . Bruit 09/15/2010  . CAD (coronary artery disease) nonobstructive per cath 2012   . Hyperlipidemia LDL goal <70 07/22/2009  . Essential hypertension 07/22/2009   Past Medical History:  Diagnosis Date  . Anemia of chronic disease   . CAD  (coronary artery disease)    a.  Myoview 4/11: EF 53%, no scar or ischemia   c. MV 2012 Nl perfusion, apical thinning.  No ischemia or scar.  EF 49%, appears greater by visual estimate.;  d.  Dob stress echo 12/13:  Negative Dob stress echo. There is no evidence of ischemia.  The LVF is normal. b. Normal cors 2016.  . Carotid stenosis    a. <22% RICA, >63% LICA by duplex 06/3543  . Chronic chest pain    occ  . ESRD (end stage renal disease) on dialysis ([place])    M-W-F  . GERD (gastroesophageal reflux disease)   . HNP (herniated nucleus pulposus), lumbar   . HTN (hypertension)    echo 3/10: EF 60%, LAE  . Hyperlipidemia   . Nephrolithiasis    "passed them all"  .  Peripheral arterial disease ([place])    a. s/p PTCA to L SFA.  Marland Kitchen Pneumonia yrs ago  . Restless legs   . Sciatic leg pain   . Sleep apnea    no cpap, needs to reschedule appointment to set up aquiring cpap  . Snores    a. presumed OSA, pt has refused sleep eval in past.  . Type II diabetes mellitus ([place])    no longer on medications, checks blood glucose at home  . Urinary frequency     Family History  Problem Relation Age of Onset  . Heart attack Sister        died @ 87  . Cancer Mother        died @ 22; unknown type  . Diabetes Brother        deceased  . Cirrhosis Father        alcohol related  . Diabetes Father   . Esophageal cancer Neg Hx   . Colon cancer Neg Hx   . Pancreatic cancer Neg Hx   . Stomach cancer Neg Hx     Past Surgical History:  Procedure Laterality Date  . ABDOMINAL AORTOGRAM W/LOWER EXTREMITY Bilateral 08/21/2019   Procedure: ABDOMINAL AORTOGRAM W/LOWER EXTREMITY;  Surgeon: Waynetta Sandy, MD;  Location: [facility];  Service: Cardiovascular;  Laterality: Bilateral;  . AMPUTATION Right 07/25/2019   Procedure: RIGHT 5th RAY AMPUTATION;  Surgeon: Newt Minion, MD;  Location: [facility];  Service: Orthopedics;  Laterality: Right;  . ANGIOPLASTY / STENTING FEMORAL Left 12/11/2013   dr  berry  . AV FISTULA PLACEMENT Left 03/19/2014   Procedure: CREATION OF ARTERIOVENOUS (AV) FISTULA  LEFT UPPER ARM;  Surgeon: Mal Misty, MD;  Location: [facility];  Service: Vascular;  Laterality: Left;  . BACK SURGERY  01/2018   screws placed   . CARDIAC CATHETERIZATION  2001 and 2010   . COLONOSCOPY W/ BIOPSIES AND POLYPECTOMY    . COLONOSCOPY WITH PROPOFOL N/A 08/01/2016   Procedure: COLONOSCOPY WITH PROPOFOL;  Surgeon: Carol Ada, MD;  Location: [facility];  Service: Endoscopy;  Laterality: N/A;  . CORONARY ARTERY BYPASS GRAFT  2019   [facility] x [facility] bypass  . ESOPHAGOGASTRODUODENOSCOPY (EGD) WITH PROPOFOL N/A 08/01/2016   Procedure: ESOPHAGOGASTRODUODENOSCOPY (EGD) WITH PROPOFOL;  Surgeon: Carol Ada, MD;  Location: [facility];  Service: Endoscopy;  Laterality: N/A;  . FOOT FRACTURE SURGERY Right    ligament repair  . FRACTURE SURGERY     left forearm  . GRAFT APPLICATION Right 10/08/624   Procedure: FAT GRAFT APPLICATION;  Surgeon: Evelina Bucy, DPM;  Location: [facility];  Service: Podiatry;  Laterality: Right;  . INGUINAL HERNIA REPAIR Left   . LEFT HEART CATH AND CORS/GRAFTS ANGIOGRAPHY N/A 04/27/2017   Procedure: LEFT HEART CATH AND CORS/GRAFTS ANGIOGRAPHY;  Surgeon: Leonie Man, MD;  Location: [facility];  Service: Cardiovascular;  Laterality: N/A;  . LEFT HEART CATHETERIZATION WITH CORONARY ANGIOGRAM N/A 06/22/2014   Procedure: LEFT HEART CATHETERIZATION WITH CORONARY ANGIOGRAM;  Surgeon: Troy Sine, MD;  Location[facility];  Service: Cardiovascular;  Laterality: N/A;  . LOWER EXTREMITY ANGIOGRAM Left 12/11/2013   Procedure: LOWER EXTREMITY ANGIOGRAM;  Surgeon: Lorretta Harp, MD;  Location: [facility];  Service: Cardiovascular;  Laterality: Left;  . LUMBAR LAMINECTOMY/DECOMPRESSION MICRODISCECTOMY Right 07/03/2017   Procedure: MICRODISCECTOMY LUMBAR FIVE - SACRAL ONE RIGHT;  Surgeon: Consuella Lose, MD;  Location: [facility];  Service:  Neurosurgery;  Laterality:  Right;  Marland Kitchen LUMBAR LAMINECTOMY/DECOMPRESSION MICRODISCECTOMY Right 10/19/2017   Procedure: MICRODISCECTOMY LUMBAR FIVE- SACRAL 1 ONE ;  Surgeon: Consuella Lose, MD;  Location: [facility];  Service: Neurosurgery;  Laterality: Right;  . TONSILLECTOMY AND ADENOIDECTOMY    . WISDOM TOOTH EXTRACTION    . WOUND DEBRIDEMENT Right 05/13/2019   Procedure: DEBRIDEMENT WOUND;  Surgeon: Evelina Bucy, DPM;  Location: [facility];  Service: Podiatry;  Laterality: Right;   Social History   Occupational History  . Occupation: retired  Tobacco Use  . Smoking status: Former Smoker    Packs/day: 1.00    Years: 2.00    Pack years: 2.00    Types: Cigarettes    Quit date: 1976    Years since quitting: 45.4  . Smokeless tobacco: Never Used  . Tobacco comment: quit smoking 40 yrs ago  Substance and Sexual Activity  . Alcohol use: No    Alcohol/week: 0.0 standard drinks    Comment: h/o social drinking  . Drug use: Not Currently    Types: Marijuana  . Sexual activity: Yes    Birth control/protection: None

## 2019-08-28 ENCOUNTER — Telehealth: Payer: Self-pay

## 2019-08-28 NOTE — Telephone Encounter (Signed)
Telephone call received from patient c/o itching on right inner thigh to below knee near previous bandage, along with fine flesh color bumps x several days. He denies any redness, swelling, fever or pain.   Advised patient he can take OTC Benadryl 25 mg 1 tablet by mouth every 4-6 hours as directed. Discussed with Rexene Agent., PA and agreed with no additional recommendations provided. Minette Brine, RN

## 2019-09-01 NOTE — Progress Notes (Signed)
HOMMER, CUNLIFFE ([id number]) Visit Report for 07/24/2019 Arrival Information Details Patient Name: Date of Service: DO NA LDSO N, [place] BERT L. 07/24/2019 9:00 A M Medical Record Number: [id number] Patient Account Number: [id number] Date of Birth/Sex: Treating RN: 1950-10-10 (69 y.o. [name] Primary Care Memory Heinrichs: [name] Other Clinician: Referring Brownie Nehme: Treating Julizza Sassone/Extender: Stormy Card in Treatment: 2 Visit Information History Since Last Visit Added or deleted any medications: No Patient Arrived: [name] Any new allergies or adverse reactions: No Arrival Time: 09:01 Had a fall or experienced change in No Accompanied By: alone activities of daily living that may affect Transfer Assistance: None risk of falls: Patient Identification Verified: Yes Signs or symptoms of abuse/neglect since No Secondary Verification Process Completed: Yes last visito Patient Requires Transmission-Based Precautions: No Hospitalized since last visit: No Patient Has Alerts: Yes Implantable device outside of the clinic No Patient Alerts: R ABI non compressible excluding cellular tissue based products placed in the center since last visit: Has Dressing in Place as Prescribed: Yes Has Footwear/Offloading in Place as Yes Prescribed: Right: Surgical Shoe with Pressure Relief Insole Pain Present Now: Yes Electronic Signature(s) Signed: 07/25/2019 4:22:27 PM By: Levan Hurst RN, BSN Entered By: Levan Hurst on 07/24/2019 09:01:48 -------------------------------------------------------------------------------- Clinic Level of Care Assessment Details Patient Name: Date of Service: DO NA LDSO N, RO BERT L. 07/24/2019 9:00 A M Medical Record Number: [id number] Patient Account Number: [id number] Date of Birth/Sex: Treating RN: Aug 26, 1950 (69 y.o. Lorette Ang, Meta.Reding Primary Care Zarahi Fuerst: [name] Other Clinician: Referring Akin Yi: Treating  Shaka Zech/Extender: Stormy Card in Treatment: 2 Clinic Level of Care Assessment Items TOOL 4 Quantity Score X- 1 0 Use when only an EandM is performed on FOLLOW-UP visit ASSESSMENTS - Nursing Assessment / Reassessment X- 1 10 Reassessment of Co-morbidities (includes updates in patient status) X- 1 5 Reassessment of Adherence to Treatment Plan ASSESSMENTS - Wound and Skin A ssessment / Reassessment []  - 0 Simple Wound Assessment / Reassessment - one wound X- 1 5 Complex Wound Assessment / Reassessment - multiple wounds X- 1 10 Dermatologic / Skin Assessment (not related to wound area) ASSESSMENTS - Focused Assessment X- 1 5 Circumferential Edema Measurements - multi extremities X- 1 10 Nutritional Assessment / Counseling / Intervention []  - 0 Lower Extremity Assessment (monofilament, tuning fork, pulses) []  - 0 Peripheral Arterial Disease Assessment (using hand held doppler) ASSESSMENTS - Ostomy and/or Continence Assessment and Care []  - 0 Incontinence Assessment and Management []  - 0 Ostomy Care Assessment and Management (repouching, etc.) PROCESS - Coordination of Care []  - 0 Simple Patient / Family Education for ongoing care X- 1 20 Complex (extensive) Patient / Family Education for ongoing care X- 1 10 Staff obtains Programmer, systems, Records, T Results / Process Orders est []  - 0 Staff telephones HHA, Nursing Homes / Clarify orders / etc []  - 0 Routine Transfer to another Facility (non-emergent condition) []  - 0 Routine Hospital Admission (non-emergent condition) []  - 0 New Admissions / Biomedical engineer / Ordering NPWT Apligraf, etc. , X- 1 20 Emergency Hospital Admission (emergent condition) []  - 0 Simple Discharge Coordination X- 1 15 Complex (extensive) Discharge Coordination PROCESS - Special Needs []  - 0 Pediatric / Minor Patient Management []  - 0 Isolation Patient Management []  - 0 Hearing / Language / Visual special  needs []  - 0 Assessment of Community assistance (transportation, D/C planning, etc.) []  - 0 Additional assistance / Altered mentation []  - 0 Support Surface(s) Assessment (bed, cushion, seat,  etc.) INTERVENTIONS - Wound Cleansing / Measurement []  - 0 Simple Wound Cleansing - one wound X- 1 5 Complex Wound Cleansing - multiple wounds X- 1 5 Wound Imaging (photographs - any number of wounds) []  - 0 Wound Tracing (instead of photographs) []  - 0 Simple Wound Measurement - one wound X- 1 5 Complex Wound Measurement - multiple wounds INTERVENTIONS - Wound Dressings []  - 0 Small Wound Dressing one or multiple wounds X- 1 15 Medium Wound Dressing one or multiple wounds []  - 0 Large Wound Dressing one or multiple wounds []  - 0 Application of Medications - topical []  - 0 Application of Medications - injection INTERVENTIONS - Miscellaneous []  - 0 External ear exam []  - 0 Specimen Collection (cultures, biopsies, blood, body fluids, etc.) []  - 0 Specimen(s) / Culture(s) sent or taken to Lab for analysis []  - 0 Patient Transfer (multiple staff / Civil Service fast streamer / Similar devices) []  - 0 Simple Staple / Suture removal (25 or less) []  - 0 Complex Staple / Suture removal (26 or more) []  - 0 Hypo / Hyperglycemic Management (close monitor of Blood Glucose) []  - 0 Ankle / Brachial Index (ABI) - do not check if billed separately X- 1 5 Vital Signs Has the patient been seen at the hospital within the last three years: Yes Total Score: 145 Level Of Care: New/Established - Level 4 Electronic Signature(s) Signed: 07/24/2019 4:13:32 PM By: Deon Pilling Entered By: Deon Pilling on 07/24/2019 10:18:11 -------------------------------------------------------------------------------- Encounter Discharge Information Details Patient Name: Date of Service: DO NA LDSO N, RO BERT L. 07/24/2019 9:00 A M Medical Record Number: [id number] Patient Account Number: [id number] Date of Birth/Sex: Treating  RN: Apr 05, 1951 (69 y.o. [name] Primary Care Afrah Burlison: [name] Other Clinician: Referring Skylee Baird: Treating Raphaela Cannaday/Extender: Stormy Card in Treatment: 2 Encounter Discharge Information Items Discharge Condition: Stable Ambulatory Status: Cane Discharge Destination: Emergency Room Telephoned: No Orders Sent: Yes Transportation: Private Auto Accompanied By: spouse Schedule Follow-up Appointment: Yes Clinical Summary of Care: Patient Declined Electronic Signature(s) Signed: 07/24/2019 4:28:12 PM By: Baruch Gouty RN, BSN Entered By: Baruch Gouty on 07/24/2019 10:17:15 -------------------------------------------------------------------------------- Lower Extremity Assessment Details Patient Name: Date of Service: DO NA LDSO N, RO BERT L. 07/24/2019 9:00 A M Medical Record Number: [id number] Patient Account Number: [id number] Date of Birth/Sex: Treating RN: 1951-03-17 (69 y.o. [name] Primary Care Madason Rauls: [name] Other Clinician: Referring Suann Klier: Treating Adilyn Humes/Extender: Stormy Card in Treatment: 2 Edema Assessment Assessed: Shirlyn Goltz: No] Patrice Paradise: No] Edema: [Left: Ye] [Right: s] Calf Left: Right: Point of Measurement: 32 cm From Medial Instep cm 30 cm Ankle Left: Right: Point of Measurement: 10 cm From Medial Instep cm 22 cm Vascular Assessment Pulses: Dorsalis Pedis Palpable: [Right:Yes] Electronic Signature(s) Signed: 07/25/2019 4:22:27 PM By: Levan Hurst RN, BSN Entered By: Levan Hurst on 07/24/2019 09:11:51 -------------------------------------------------------------------------------- Multi Wound Chart Details Patient Name: Date of Service: DO NA LDSO N, RO BERT L. 07/24/2019 9:00 A M Medical Record Number: [id number] Patient Account Number: [id number] Date of Birth/Sex: Treating RN: [birth year] (69 y.o. [name] Primary Care Devante Capano: [name] Other  Clinician: Referring Siddhartha Hoback: Treating Machaela Caterino/Extender: Stormy Card in Treatment: 2 Vital Signs Height(in): 71 Capillary Blood Glucose(mg/dl): 93 Weight(lbs): 150 Pulse(bpm): 25 Body Mass Index(BMI): 21 Blood Pressure(mmHg): 104/59 Temperature(F): 98.6 Respiratory Rate(breaths/min): 18 Photos: [1:No Photos Right, Lateral Foot] [N/A:N/A N/A] Wound Location: [1:Gradually Appeared] [N/A:N/A] Wounding Event: [1:Diabetic Wound/Ulcer of the Lower] [N/A:N/A] Primary Etiology: [1:Extremity Congestive Heart Failure,  Coronary] [N/A:N/A] Comorbid History: [1:Artery Disease, Hypertension, Peripheral Arterial Disease, Type II Diabetes, End Stage Renal Disease, Neuropathy 04/25/2019] [N/A:N/A] Date Acquired: [1:2] [N/A:N/A] Weeks of Treatment: [1:Open] [N/A:N/A] Wound Status: [1:0.7x1.5x0.5] [N/A:N/A] Measurements L x W x D (cm) [1:0.825] [N/A:N/A] A (cm) : rea [1:0.412] [N/A:N/A] Volume (cm) : [1:22.80%] [N/A:N/A] % Reduction in A rea: [1:-28.70%] [N/A:N/A] % Reduction in Volume: [1:Grade 2] [N/A:N/A] Classification: [1:Medium] [N/A:N/A] Exudate A mount: [1:Serous] [N/A:N/A] Exudate Type: [1:amber] [N/A:N/A] Exudate Color: [1:Flat and Intact] [N/A:N/A] Wound Margin: [1:None Present (0%)] [N/A:N/A] Granulation A mount: [1:Large (67-100%)] [N/A:N/A] Necrotic A mount: [1:Fat Layer (Subcutaneous Tissue)] [N/A:N/A] Exposed Structures: [1:Exposed: Yes Fascia: No Tendon: No Muscle: No Joint: No Bone: No None] [N/A:N/A] Treatment Notes Wound #1 (Right, Lateral Foot) 3. Primary Dressing Applied Dry Gauze 4. Secondary Dressing Roll Gauze 7. Footwear/Offloading device applied Surgical shoe Electronic Signature(s) Signed: 07/26/2019 6:55:41 AM By: Linton Ham MD Signed: 09/01/2019 1:31:05 PM By: Deon Pilling Entered By: Linton Ham on 07/24/2019 10:24:19 -------------------------------------------------------------------------------- Multi-Disciplinary  Care Plan Details Patient Name: Date of Service: DO NA LDSO N, RO BERT L. 07/24/2019 9:00 A M Medical Record Number: [id number] Patient Account Number: [id number] Date of Birth/Sex: Treating RN: [birth year] (69 y.o. [name] Primary Care Yann Biehn: [name] Other Clinician: Referring Raneem Mendolia: Treating Baxter Gonzalez/Extender: Stormy Card in Treatment: 2 Active Inactive Electronic Signature(s) Signed: 08/20/2019 2:26:07 PM By: Levan Hurst RN, BSN Previous Signature: 07/25/2019 4:22:27 PM Version By: Levan Hurst RN, BSN Entered By: Levan Hurst on 08/08/2019 10:19:44 -------------------------------------------------------------------------------- Pain Assessment Details Patient Name: Date of Service: DO NA LDSO N, RO BERT L. 07/24/2019 9:00 A M Medical Record Number: [id number] Patient Account Number: [id number] Date of Birth/Sex: Treating RN: 07/07/1950 (69 y.o. [name] Primary Care Morrissa Shein: [name] Other Clinician: Referring Shivan Hodes: Treating Razi Hickle/Extender: Stormy Card in Treatment: 2 Active Problems Location of Pain Severity and Description of Pain Patient Has Paino Yes Site Locations Pain Location: Pain Location: Pain in Ulcers With Dressing Change: Yes Duration of the Pain. Constant / Intermittento Intermittent Rate the pain. Current Pain Level: 6 Character of Pain Describe the Pain: Throbbing Pain Management and Medication Current Pain Management: Medication: Yes Cold Application: No Rest: No Massage: No Activity: No T.E.N.S.: No Heat Application: No Leg drop or elevation: No Is the Current Pain Management Adequate: Adequate How does your wound impact your activities of daily livingo Sleep: No Bathing: No Appetite: No Relationship With Others: No Bladder Continence: No Emotions: No Bowel Continence: No Work: No Toileting: No Drive: No Dressing: No Hobbies: No Metallurgist) Signed: 07/25/2019 4:22:27 PM By: Levan Hurst RN, BSN Entered By: Levan Hurst on 07/24/2019 09:06:17 -------------------------------------------------------------------------------- Patient/Caregiver Education Details Patient Name: Date of Service: DO NA Wilmon Arms, RO BERT L. 4/15/2021andnbsp9:00 A M Medical Record Number: [id number] Patient Account Number: [id number] Date of Birth/Gender: Treating RN: Aug 26, 1950 (69 y.o. [name] Primary Care Physician: [name] Other Clinician: Referring Physician: Treating Physician/Extender: Stormy Card in Treatment: 2 Education Assessment Education Provided To: Patient Education Topics Provided Infection: Handouts: CDC antimicrobial patient education_English, Infection Prevention and Management Methods: Explain/Verbal, Printed Responses: Reinforcements needed Electronic Signature(s) Signed: 07/25/2019 4:22:27 PM By: Levan Hurst RN, BSN Signed: 07/25/2019 4:22:27 PM By: Levan Hurst RN, BSN Entered By: Levan Hurst on 07/24/2019 09:05:18 -------------------------------------------------------------------------------- Wound Assessment Details Patient Name: Date of Service: DO NA LDSO N, RO BERT L. 07/24/2019 9:00 A M Medical Record Number: [id number] Patient Account Number: [id number] Date of Birth/Sex: Treating  RN: 11-30-1950 (69 y.o. [name] Primary Care Carren Blakley: [name] Other Clinician: Referring Miles Borkowski: Treating Rudell Ortman/Extender: Stormy Card in Treatment: 2 Wound Status Wound Number: 1 Primary Diabetic Wound/Ulcer of the Lower Extremity Etiology: Wound Location: Right, Lateral Foot Wound Open Wounding Event: Gradually Appeared Status: Date Acquired: 04/25/2019 Comorbid Congestive Heart Failure, Coronary Artery Disease, Hypertension, Weeks Of Treatment: 2 History: Peripheral Arterial Disease, Type II Diabetes, End Stage  Renal Clustered Wound: No Disease, Neuropathy Photos Wound Measurements Length: (cm) 0.7 Width: (cm) 1.5 Depth: (cm) 0.5 Area: (cm) 0.825 Volume: (cm) 0.412 % Reduction in Area: 22.8% % Reduction in Volume: -28.7% Epithelialization: None Tunneling: No Undermining: No Wound Description Classification: Grade 2 Wound Margin: Flat and Intact Exudate Amount: Medium Exudate Type: Serous Exudate Color: amber Foul Odor After Cleansing: No Slough/Fibrino Yes Wound Bed Granulation Amount: None Present (0%) Exposed Structure Necrotic Amount: Large (67-100%) Fascia Exposed: No Necrotic Quality: Adherent Slough Fat Layer (Subcutaneous Tissue) Exposed: Yes Tendon Exposed: No Muscle Exposed: No Joint Exposed: No Bone Exposed: No Electronic Signature(s) Signed: 07/25/2019 4:22:27 PM By: Levan Hurst RN, BSN Signed: 07/29/2019 1:29:28 PM By: Sandre Kitty Entered By: Sandre Kitty on 07/24/2019 14:51:00 -------------------------------------------------------------------------------- Vitals Details Patient Name: Date of Service: DO NA LDSO N, RO BERT L. 07/24/2019 9:00 A M Medical Record Number: [id number] Patient Account Number: [id number] Date of Birth/Sex: Treating RN: December 30, 1950 (69 y.o. [name] Primary Care Shaunie Boehm: [name] Other Clinician: Referring Ansley Stanwood: Treating Immanuel Fedak/Extender: Stormy Card in Treatment: 2 Vital Signs Time Taken: 09:01 Temperature (F): 98.6 Height (in): 71 Pulse (bpm): 94 Weight (lbs): 150 Respiratory Rate (breaths/min): 18 Body Mass Index (BMI): 20.9 Blood Pressure (mmHg): 104/59 Capillary Blood Glucose (mg/dl): 93 Reference Range: 80 - 120 mg / dl Electronic Signature(s) Signed: 07/25/2019 4:22:27 PM By: Levan Hurst RN, BSN Entered By: Levan Hurst on 07/24/2019 09:05:58

## 2019-09-04 ENCOUNTER — Encounter: Payer: Self-pay | Admitting: Cardiovascular Disease

## 2019-09-09 ENCOUNTER — Other Ambulatory Visit: Payer: Self-pay

## 2019-09-09 ENCOUNTER — Ambulatory Visit (INDEPENDENT_AMBULATORY_CARE_PROVIDER_SITE_OTHER): Payer: Medicare Other | Admitting: Family

## 2019-09-09 ENCOUNTER — Encounter: Payer: Self-pay | Admitting: Family

## 2019-09-09 VITALS — Ht 71.0 in | Wt 162.0 lb

## 2019-09-09 DIAGNOSIS — E11621 Type 2 diabetes mellitus with foot ulcer: Secondary | ICD-10-CM

## 2019-09-09 DIAGNOSIS — L97509 Non-pressure chronic ulcer of other part of unspecified foot with unspecified severity: Secondary | ICD-10-CM

## 2019-09-09 DIAGNOSIS — Z89421 Acquired absence of other right toe(s): Secondary | ICD-10-CM

## 2019-09-09 MED ORDER — SILVER SULFADIAZINE 1 % EX CREA
1.0000 | TOPICAL_CREAM | Freq: Every day | CUTANEOUS | 0 refills | Status: DC
Start: 2019-09-09 — End: 2019-12-02

## 2019-09-09 NOTE — Progress Notes (Signed)
Office Visit Note   Patient: Jon Anderson           Date of Birth: 02/10/1951           MRN: [id number] Visit Date: 09/09/2019              Requested by: [name], MD [address] [place],  [place] [address] PCP: [name], MD  Chief Complaint  Patient presents with  . Right Foot - Routine Post Op    07/25/19 right 5th ray amputation       HPI: The patient is a 69 year old gentleman seen 6 weeks status post right fifth ray amputation.  He has also had angioplasty of the right lower extremity following his ray amputation.  He has been using a nitroglycerin patches as well as Trental.  Complains of worse pain and tingling in the foot especially with dialysis occasionally at night this is intermittent feels like pins-and-needles.  Reports new wound to the tip of his right great toe.  Assessment & Plan: Visit Diagnoses:  1. 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 Ischemic ulcer diabetic foot ([place])     Plan: He will continue with current therapy Trental and nitroglycerin patches.  Begin using Silvadene for daily dressing changes to toe ulcer and amputation ulceration.  Minimize weightbearing.  Follow-Up Instructions: No follow-ups on file.   Ortho Exam  Patient is alert, oriented, no adenopathy, well-dressed, normal affect, normal respiratory effort.  Moderate edema of the right foot and ankle.  No cellulitis The patient continues to have tenderness along his ray amputation.  Debridement of eschar.  Underlying epithelialization.  There is only 1 remaining area that is not yet healed there is about a nickel sized area to the distal end of his incision that is covered with eschar.  There is no gaping no drainage no surrounding erythema.  Does have 4 mm in diameter eschar ischemic ulcer to the tip of his great toe on the right as well.  There is no surrounding erythema no drainage this is tender.   Imaging: No results found. No images  are attached to the encounter.  Labs: Lab Results  Component Value Date   HGBA1C 6.4 (H) 07/24/2019   HGBA1C 6.5 (H) 07/18/2018   HGBA1C 6.4 (H) 10/16/2017   ESRSEDRATE 22 (H) 07/24/2019   CRP <0.5 07/24/2019   REPTSTATUS 07/29/2019 FINAL 07/24/2019   CULT  07/24/2019    NO GROWTH 5 DAYS Performed at [facility], [place] [address] [place], [place] [address]      Lab Results  Component Value Date   ALBUMIN 3.7 07/24/2019   ALBUMIN 3.2 (L) 04/24/2019   ALBUMIN 3.7 11/04/2018   PREALBUMIN 28.0 07/24/2019    Lab Results  Component Value Date   MG 1.8 04/26/2019   MG 1.8 04/24/2019   MG 2.1 03/22/2017   No results found for: VD25OH  Lab Results  Component Value Date   PREALBUMIN 28.0 07/24/2019   CBC EXTENDED Latest Ref Rng & Units 08/21/2019 07/25/2019 07/24/2019  WBC 4.0 - 10.5 K/uL - 5.0 5.5  RBC 4.22 - 5.81 MIL/uL - 3.96(L) 4.37  HGB 13.0 - 17.0 g/dL 12.2(L) 11.5(L) 12.3(L)  HCT 39.0 - 52.0 % 36.0(L) 36.8(L) 40.2  PLT 150 - 400 K/uL - 235 259  NEUTROABS 1.7 - 7.7 K/uL - - 2.4  LYMPHSABS 0.7 - 4.0 K/uL - - 2.2     Body mass index  is 22.59 kg/m.  Orders:  No orders of the defined types were placed in this encounter.  No orders of the defined types were placed in this encounter.    Procedures: No procedures performed  Clinical Data: No additional findings.  ROS:  All other systems negative, except as noted in the HPI. Review of Systems  Constitutional: Negative for chills and fever.  Cardiovascular: Positive for leg swelling.  Skin: Positive for wound. Negative for color change.  Neurological: Positive for numbness.    Objective: Vital Signs: Ht 5\' 11"  (1.803 m)   Wt 162 lb (73.5 kg)   BMI 22.59 kg/m   Specialty Comments:  No specialty comments available.  PMFS History: Patient Active Problem List   Diagnosis Date Noted  . 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 Ischemic ulcer diabetic foot  ([place]) 09/09/2019  . Osteomyelitis of right foot ([place])   . Diabetic foot infection ([place]) 07/24/2019  . Chronic diastolic CHF (congestive heart failure) ([place]) 07/24/2019  . Diabetic ulcer of right midfoot associated with diabetes mellitus due to underlying condition, with fat layer exposed ([place])   . Gastroenteritis 04/24/2019  . Allergy, unspecified, initial encounter 01/27/2019  . DM neuropathy with neurologic complication ([place]) 40/97/3532  . GERD (gastroesophageal reflux disease) 02/28/2018  . Nephrolithiasis 02/28/2018  . Sciatic leg pain 02/28/2018  . Snores 02/28/2018  . Orthopnea 02/23/2018  . Elevated troponin 02/23/2018  . HNP (herniated nucleus pulposus), lumbar 07/03/2017  . Encounter for removal of sutures 06/01/2017  . Chest pain in adult 04/27/2017  . Restless leg syndrome, uncontrolled 04/27/2017  . 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 Anemia of chronic disease 03/17/2017  . ESRD (end stage renal disease) on dialysis ([place]) 03/17/2017  . Chronic chest pain 03/17/2017  . Type II diabetes mellitus ([place]) 03/17/2017  . Acute on chronic diastolic heart failure ([place]) 03/17/2017  . Acute heart failure ([place]) 03/17/2017  . Lumbar radiculopathy 01/16/2017  . Pre-transplant evaluation for kidney transplant 06/15/2016  . Pain, unspecified 04/17/2016  . Increased frequency of urination 12/17/2015  . Nocturia 12/17/2015  . Chest pain, non-cardiac 10/09/2015  . Hypercalcemia 05/10/2015  . Other fluid overload 02/24/2015  . 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 Fatty (change of) liver, not elsewhere classified 07/02/2014  . Aftercare including intermittent dialysis ([place]) 06/24/2014  . Diarrhea, unspecified 06/24/2014  . Fever, unspecified 06/24/2014  . Iron deficiency anemia, unspecified 06/24/2014  . Other specified coagulation defects ([place]) 06/24/2014  . Pruritus, unspecified 06/24/2014  . Secondary hyperparathyroidism of  renal origin ([place]) 06/24/2014  . Type 2 diabetes mellitus with diabetic peripheral angiopathy without gangrene ([place]) 06/24/2014  . Acute on chronic renal failure ([place]) 06/16/2014  . Shoulder pain, left 12/15/2013  . Chest pain 12/15/2013  . Claudication ([place]) 12/11/2013  . PVD (peripheral vascular disease) ([place]) 12/11/2013  . Carotid artery disease ([place]) 09/30/2013  . Acute chest pain 11/15/2012  . Bruit 09/15/2010  . CAD (coronary artery disease) nonobstructive per cath 2012   . Hyperlipidemia LDL goal <70 07/22/2009  . Essential hypertension 07/22/2009   Past Medical History:  Diagnosis Date  . Anemia of chronic disease   . CAD (coronary artery disease)    a.  Myoview 4/11: EF 53%, no scar or ischemia   c. MV 2012 Nl perfusion, apical thinning.  No ischemia or scar.  EF 49%, appears greater by visual estimate.;  d.  Dob stress  echo 12/13:  Negative Dob stress echo. There is no evidence of ischemia.  The LVF is normal. b. Normal cors 2016.  . Carotid stenosis    a. <73% RICA, >53% LICA by duplex 05/9922  . Chronic chest pain    occ  . ESRD (end stage renal disease) on dialysis ([place])    M-W-F  . GERD (gastroesophageal reflux disease)   . HNP (herniated nucleus pulposus), lumbar   . HTN (hypertension)    echo 3/10: EF 60%, LAE  . Hyperlipidemia   . Nephrolithiasis    "passed them all"  . Peripheral arterial disease ([place])    a. s/p PTCA to L SFA.  Marland Kitchen Pneumonia yrs ago  . Restless legs   . Sciatic leg pain   . Sleep apnea    no cpap, needs to reschedule appointment to set up aquiring cpap  . Snores    a. presumed OSA, pt has refused sleep eval in past.  . Type II diabetes mellitus ([place])    no longer on medications, checks blood glucose at home  . Urinary frequency     Family History  Problem Relation Age of Onset  . Heart attack Sister        died @ 61  . Cancer Mother        died @ 85; unknown type  . Diabetes Brother        deceased  . Cirrhosis Father         alcohol related  . Diabetes Father   . Esophageal cancer Neg Hx   . Colon cancer Neg Hx   . Pancreatic cancer Neg Hx   . Stomach cancer Neg Hx     Past Surgical History:  Procedure Laterality Date  . ABDOMINAL AORTOGRAM W/LOWER EXTREMITY Bilateral 08/21/2019   Procedure: ABDOMINAL AORTOGRAM W/LOWER EXTREMITY;  Surgeon: Waynetta Sandy, MD;  Location: [facility];  Service: Cardiovascular;  Laterality: Bilateral;  . AMPUTATION Right 07/25/2019   Procedure: RIGHT 5th RAY AMPUTATION;  Surgeon: Newt Minion, MD;  Location: [facility];  Service: Orthopedics;  Laterality: Right;  . ANGIOPLASTY / STENTING FEMORAL Left 12/11/2013   dr berry  . AV FISTULA PLACEMENT Left 03/19/2014   Procedure: CREATION OF ARTERIOVENOUS (AV) FISTULA  LEFT UPPER ARM;  Surgeon: Mal Misty, MD;  Location: [facility];  Service: Vascular;  Laterality: Left;  . BACK SURGERY  01/2018   screws placed   . CARDIAC CATHETERIZATION  2001 and 2010   . COLONOSCOPY W/ BIOPSIES AND POLYPECTOMY    . COLONOSCOPY WITH PROPOFOL N/A 08/01/2016   Procedure: COLONOSCOPY WITH PROPOFOL;  Surgeon: Carol Ada, MD;  Location: [facility];  Service: Endoscopy;  Laterality: N/A;  . CORONARY ARTERY BYPASS GRAFT  2019   [facility] x [facility] bypass  . ESOPHAGOGASTRODUODENOSCOPY (EGD) WITH PROPOFOL N/A 08/01/2016   Procedure: ESOPHAGOGASTRODUODENOSCOPY (EGD) WITH PROPOFOL;  Surgeon: Carol Ada, MD;  [facility];  Service: Endoscopy;  Laterality: N/A;  . FOOT FRACTURE SURGERY Right    ligament repair  . FRACTURE SURGERY     left forearm  . GRAFT APPLICATION Right 05/17/8339   Procedure: FAT GRAFT APPLICATION;  Surgeon: Evelina Bucy, DPM;  Location: [facility];  Service: Podiatry;  Laterality: Right;  . INGUINAL HERNIA REPAIR Left   . LEFT HEART CATH AND CORS/GRAFTS ANGIOGRAPHY N/A 04/27/2017   Procedure: LEFT HEART CATH AND CORS/GRAFTS ANGIOGRAPHY;  Surgeon: Leonie Man, MD;  Location: [facility];  Service: Cardiovascular;  Laterality: N/A;  . LEFT HEART CATHETERIZATION WITH CORONARY ANGIOGRAM N/A 06/22/2014   Procedure: LEFT HEART CATHETERIZATION WITH CORONARY ANGIOGRAM;  Surgeon: Troy Sine, MD;  Location: [facility];  Service: Cardiovascular;  Laterality: N/A;  . LOWER EXTREMITY ANGIOGRAM Left 12/11/2013   Procedure: LOWER EXTREMITY ANGIOGRAM;  Surgeon: Lorretta Harp, MD;  Location: [facility];  Service: Cardiovascular;  Laterality: Left;  . LUMBAR LAMINECTOMY/DECOMPRESSION MICRODISCECTOMY Right 07/03/2017   Procedure: MICRODISCECTOMY LUMBAR FIVE - SACRAL ONE RIGHT;  Surgeon: Consuella Lose, MD;  Location: [facility];  Service: Neurosurgery;  Laterality: Right;  . LUMBAR LAMINECTOMY/DECOMPRESSION MICRODISCECTOMY Right 10/19/2017   Procedure: MICRODISCECTOMY LUMBAR FIVE- SACRAL 1 ONE ;  Surgeon: Consuella Lose, MD;  Location: [facility];  Service: Neurosurgery;  Laterality: Right;  . TONSILLECTOMY AND ADENOIDECTOMY    . WISDOM TOOTH EXTRACTION    . WOUND DEBRIDEMENT Right 05/13/2019   Procedure: DEBRIDEMENT WOUND;  Surgeon: Evelina Bucy, DPM;  Location: [facility];  Service: Podiatry;  Laterality: Right;   Social History   Occupational History  . Occupation: retired  Tobacco Use  . Smoking status: Former Smoker    Packs/day: 1.00    Years: 2.00    Pack years: 2.00    Types: Cigarettes    Quit date: 1976    Years since quitting: 45.4  . Smokeless tobacco: Never Used  . Tobacco comment: quit smoking 40 yrs ago  Substance and Sexual Activity  . Alcohol use: No    Alcohol/week: 0.0 standard drinks    Comment: h/o social drinking  . Drug use: Not Currently    Types: Marijuana  . Sexual activity: Yes    Birth control/protection: None

## 2019-09-23 ENCOUNTER — Encounter: Payer: Self-pay | Admitting: Family

## 2019-09-23 ENCOUNTER — Other Ambulatory Visit: Payer: Self-pay

## 2019-09-23 ENCOUNTER — Ambulatory Visit (INDEPENDENT_AMBULATORY_CARE_PROVIDER_SITE_OTHER): Payer: Medicare Other | Admitting: Orthopedic Surgery

## 2019-09-23 VITALS — Ht 71.0 in | Wt 162.0 lb

## 2019-09-23 DIAGNOSIS — Z89421 Acquired absence of other right toe(s): Secondary | ICD-10-CM

## 2019-09-23 DIAGNOSIS — L97509 Non-pressure chronic ulcer of other part of unspecified foot with unspecified severity: Secondary | ICD-10-CM

## 2019-09-23 DIAGNOSIS — E11621 Type 2 diabetes mellitus with foot ulcer: Secondary | ICD-10-CM

## 2019-09-23 NOTE — Progress Notes (Signed)
Office Visit Note   Patient: Jon Anderson           Date of Birth: 10-09-1950           MRN: [id number] Visit Date: 09/23/2019              Requested by: [name], MD [address] [place],  [place] [address] PCP: [name], MD  Chief Complaint  Patient presents with  . Right Foot - Routine Post Op    07/25/19 right 5th ray amputation       HPI: Patient is a 69 year old gentleman who is 2 months status post right foot fifth ray amputation.  He is currently taking the Trental once a day nitroglycerin patch once a day Silvadene dressing changes and is also on Plavix.  He is wearing his postoperative shoe when he states the regular shoewear is painful.  Assessment & Plan: Visit Diagnoses:  1. 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 Ischemic ulcer diabetic foot ([place])     Plan: Recommended the Trental 3 times a day nitroglycerin patch daily continue with pressure offloading  Follow-Up Instructions: Return in about 3 weeks (around 10/14/2019).   Ortho Exam  Patient is alert, oriented, no adenopathy, well-dressed, normal affect, normal respiratory effort. Examination patient has a small superficial ischemic ulcer on the tip of the great toe this is 5 mm in diameter and 1 mm deep.  There is no sausage digit swelling or cellulitis of the great toe.  Patient has a 10 mm ischemic area over the distal aspect of the fifth ray amputation.  There is no exposed bone no drainage no odor no cellulitis.  Imaging: No results found. No images are attached to the encounter.  Labs: Lab Results  Component Value Date   HGBA1C 6.4 (H) 07/24/2019   HGBA1C 6.5 (H) 07/18/2018   HGBA1C 6.4 (H) 10/16/2017   ESRSEDRATE 22 (H) 07/24/2019   CRP <0.5 07/24/2019   REPTSTATUS 07/29/2019 FINAL 07/24/2019   CULT  07/24/2019    NO GROWTH 5 DAYS Performed at [facility], [place] [address], [place],  [address]      Lab Results  Component Value Date    ALBUMIN 3.7 07/24/2019   ALBUMIN 3.2 (L) 04/24/2019   ALBUMIN 3.7 11/04/2018   PREALBUMIN 28.0 07/24/2019    Lab Results  Component Value Date   MG 1.8 04/26/2019   MG 1.8 04/24/2019   MG 2.1 03/22/2017   No results found for: VD25OH  Lab Results  Component Value Date   PREALBUMIN 28.0 07/24/2019   CBC EXTENDED Latest Ref Rng & Units 08/21/2019 07/25/2019 07/24/2019  WBC 4.0 - 10.5 K/uL - 5.0 5.5  RBC 4.22 - 5.81 MIL/uL - 3.96(L) 4.37  HGB 13.0 - 17.0 g/dL 12.2(L) 11.5(L) 12.3(L)  HCT 39 - 52 % 36.0(L) 36.8(L) 40.2  PLT 150 - 400 K/uL - 235 259  NEUTROABS 1.7 - 7.7 K/uL - - 2.4  LYMPHSABS 0.7 - 4.0 K/uL - - 2.2     Body mass index is 22.59 kg/m.  Orders:  No orders of the defined types were placed in this encounter.  No orders of the defined types were placed in this encounter.    Procedures: No procedures performed  Clinical Data: No additional findings.  ROS:  All other systems negative, except as noted in the HPI. Review of Systems  Objective: Vital Signs: Ht 5\' 11"  (1.803 m)  Wt 162 lb (73.5 kg)   BMI 22.59 kg/m   Specialty Comments:  No specialty comments available.  PMFS History: Patient Active Problem List   Diagnosis Date Noted  . 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 Ischemic ulcer diabetic foot ([place]) 09/09/2019  . Osteomyelitis of right foot ([place])   . Diabetic foot infection ([place]) 07/24/2019  . Chronic diastolic CHF (congestive heart failure) ([place]) 07/24/2019  . Diabetic ulcer of right midfoot associated with diabetes mellitus due to underlying condition, with fat layer exposed ([place])   . Gastroenteritis 04/24/2019  . Allergy, unspecified, initial encounter 01/27/2019  . DM neuropathy with neurologic complication ([place]) 09/32/6712  . GERD (gastroesophageal reflux disease) 02/28/2018  . Nephrolithiasis 02/28/2018  . Sciatic leg pain 02/28/2018  . Snores 02/28/2018  . Orthopnea 02/23/2018  .  Elevated troponin 02/23/2018  . HNP (herniated nucleus pulposus), lumbar 07/03/2017  . Encounter for removal of sutures 06/01/2017  . Chest pain in adult 04/27/2017  . Restless leg syndrome, uncontrolled 04/27/2017  . 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 Anemia of chronic disease 03/17/2017  . ESRD (end stage renal disease) on dialysis ([place]) 03/17/2017  . Chronic chest pain 03/17/2017  . Type II diabetes mellitus ([place]) 03/17/2017  . Acute on chronic diastolic heart failure ([place]) 03/17/2017  . Acute heart failure ([place]) 03/17/2017  . Lumbar radiculopathy 01/16/2017  . Pre-transplant evaluation for kidney transplant 06/15/2016  . Pain, unspecified 04/17/2016  . Increased frequency of urination 12/17/2015  . Nocturia 12/17/2015  . Chest pain, non-cardiac 10/09/2015  . Hypercalcemia 05/10/2015  . Other fluid overload 02/24/2015  . 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 Fatty (change of) liver, not elsewhere classified 07/02/2014  . Aftercare including intermittent dialysis ([place]) 06/24/2014  . Diarrhea, unspecified 06/24/2014  . Fever, unspecified 06/24/2014  . Iron deficiency anemia, unspecified 06/24/2014  . Other specified coagulation defects ([place]) 06/24/2014  . Pruritus, unspecified 06/24/2014  . Secondary hyperparathyroidism of renal origin ([place]) 06/24/2014  . Type 2 diabetes mellitus with diabetic peripheral angiopathy without gangrene ([place]) 06/24/2014  . Acute on chronic renal failure ([place]) 06/16/2014  . Shoulder pain, left 12/15/2013  . Chest pain 12/15/2013  . Claudication ([place]) 12/11/2013  . PVD (peripheral vascular disease) ([place]) 12/11/2013  . Carotid artery disease ([place]) 09/30/2013  . Acute chest pain 11/15/2012  . Bruit 09/15/2010  . CAD (coronary artery disease) nonobstructive per cath 2012   . Hyperlipidemia LDL goal <70 07/22/2009  . Essential hypertension 07/22/2009   Past Medical History:  Diagnosis Date  .  Anemia of chronic disease   . CAD (coronary artery disease)    a.  Myoview 4/11: EF 53%, no scar or ischemia   c. MV 2012 Nl perfusion, apical thinning.  No ischemia or scar.  EF 49%, appears greater by visual estimate.;  d.  Dob stress echo 12/13:  Negative Dob stress echo. There is no evidence of ischemia.  The LVF is normal. b. Normal cors 2016.  . Carotid stenosis    a. <45% RICA, >80% LICA by duplex 12/9831  . Chronic chest pain    occ  . ESRD (end stage renal disease) on dialysis ([place])    M-W-F  . GERD (gastroesophageal reflux disease)   . HNP (herniated nucleus pulposus), lumbar   . HTN (hypertension)    echo 3/10: EF 60%, LAE  . Hyperlipidemia   . Nephrolithiasis    "passed  them all"  . Peripheral arterial disease ([place])    a. s/p PTCA to L SFA.  Marland Kitchen Pneumonia yrs ago  . Restless legs   . Sciatic leg pain   . Sleep apnea    no cpap, needs to reschedule appointment to set up aquiring cpap  . Snores    a. presumed OSA, pt has refused sleep eval in past.  . Type II diabetes mellitus ([place])    no longer on medications, checks blood glucose at home  . Urinary frequency     Family History  Problem Relation Age of Onset  . Heart attack Sister        died @ 83  . Cancer Mother        died @ 54; unknown type  . Diabetes Brother        deceased  . Cirrhosis Father        alcohol related  . Diabetes Father   . Esophageal cancer Neg Hx   . Colon cancer Neg Hx   . Pancreatic cancer Neg Hx   . Stomach cancer Neg Hx     Past Surgical History:  Procedure Laterality Date  . ABDOMINAL AORTOGRAM W/LOWER EXTREMITY Bilateral 08/21/2019   Procedure: ABDOMINAL AORTOGRAM W/LOWER EXTREMITY;  Surgeon: Waynetta Sandy, MD;  Location: [facility];  Service: Cardiovascular;  Laterality: Bilateral;  . AMPUTATION Right 07/25/2019   Procedure: RIGHT 5th RAY AMPUTATION;  Surgeon: Newt Minion, MD;  Location: [facility];  Service: Orthopedics;  Laterality: Right;  . ANGIOPLASTY /  STENTING FEMORAL Left 12/11/2013   dr berry  . AV FISTULA PLACEMENT Left 03/19/2014   Procedure: CREATION OF ARTERIOVENOUS (AV) FISTULA  LEFT UPPER ARM;  Surgeon: Mal Misty, MD;  Location: [facility];  Service: Vascular;  Laterality: Left;  . BACK SURGERY  01/2018   screws placed   . CARDIAC CATHETERIZATION  2001 and 2010   . COLONOSCOPY W/ BIOPSIES AND POLYPECTOMY    . COLONOSCOPY WITH PROPOFOL N/A 08/01/2016   Procedure: COLONOSCOPY WITH PROPOFOL;  Surgeon: Carol Ada, MD;  [facility];  Service: Endoscopy;  Laterality: N/A;  . CORONARY ARTERY BYPASS GRAFT  2019   [facility] bypass  . ESOPHAGOGASTRODUODENOSCOPY (EGD) WITH PROPOFOL N/A 08/01/2016   Procedure: ESOPHAGOGASTRODUODENOSCOPY (EGD) WITH PROPOFOL;  Surgeon: Carol Ada, MD;  [facility];  Service: Endoscopy;  Laterality: N/A;  . FOOT FRACTURE SURGERY Right    ligament repair  . FRACTURE SURGERY     left forearm  . GRAFT APPLICATION Right 04/15/9676   Procedure: FAT GRAFT APPLICATION;  Surgeon: Evelina Bucy, DPM;  Location: [facility];  Service: Podiatry;  Laterality: Right;  . INGUINAL HERNIA REPAIR Left   . LEFT HEART CATH AND CORS/GRAFTS ANGIOGRAPHY N/A 04/27/2017   Procedure: LEFT HEART CATH AND CORS/GRAFTS ANGIOGRAPHY;  Surgeon: Leonie Man, MD;  Location: [facility];  Service: Cardiovascular;  Laterality: N/A;  . LEFT HEART CATHETERIZATION WITH CORONARY ANGIOGRAM N/A 06/22/2014   Procedure: LEFT HEART CATHETERIZATION WITH CORONARY ANGIOGRAM;  Surgeon: Troy Sine, MD;  Location: [facility];  Service: Cardiovascular;  Laterality: N/A;  . LOWER EXTREMITY ANGIOGRAM Left 12/11/2013   Procedure: LOWER EXTREMITY ANGIOGRAM;  Surgeon: Lorretta Harp, MD;  Location: [facility] CATH LAB;  Service: Cardiovascular;  Laterality: Left;  . LUMBAR LAMINECTOMY/DECOMPRESSION MICRODISCECTOMY Right 07/03/2017   Procedure: MICRODISCECTOMY LUMBAR FIVE - SACRAL ONE RIGHT;  Surgeon: Consuella Lose, MD;  Location: [facility];  Service: Neurosurgery;  Laterality: Right;  . LUMBAR LAMINECTOMY/DECOMPRESSION MICRODISCECTOMY Right 10/19/2017   Procedure: MICRODISCECTOMY LUMBAR FIVE- SACRAL 1 ONE ;  Surgeon: Consuella Lose, MD;  Location: [facility];  Service: Neurosurgery;  Laterality: Right;  . TONSILLECTOMY AND ADENOIDECTOMY    . WISDOM TOOTH EXTRACTION    . WOUND DEBRIDEMENT Right 05/13/2019   Procedure: DEBRIDEMENT WOUND;  Surgeon: Evelina Bucy, DPM;  Location[facility];  Service: Podiatry;  Laterality: Right;   Social History   Occupational History  . Occupation: retired  Tobacco Use  . Smoking status: Former Smoker    Packs/day: 1.00    Years: 2.00    Pack years: 2.00    Types: Cigarettes    Quit date: 1976    Years since quitting: 45.4  . Smokeless tobacco: Never Used  . Tobacco comment: quit smoking 40 yrs ago  Vaping Use  . Vaping Use: Never used  Substance and Sexual Activity  . Alcohol use: No    Alcohol/week: 0.0 standard drinks    Comment: h/o social drinking  . Drug use: Not Currently    Types: Marijuana  . Sexual activity: Yes    Birth control/protection: None

## 2019-09-25 ENCOUNTER — Other Ambulatory Visit: Payer: Self-pay | Admitting: *Deleted

## 2019-09-25 DIAGNOSIS — I739 Peripheral vascular disease, unspecified: Secondary | ICD-10-CM

## 2019-09-26 ENCOUNTER — Ambulatory Visit: Payer: Medicare Other | Admitting: Vascular Surgery

## 2019-09-26 ENCOUNTER — Encounter (HOSPITAL_COMMUNITY): Payer: Medicare Other

## 2019-09-26 ENCOUNTER — Ambulatory Visit (HOSPITAL_COMMUNITY): Payer: Medicare Other

## 2019-10-03 ENCOUNTER — Ambulatory Visit: Payer: Medicare Other | Admitting: Vascular Surgery

## 2019-10-03 ENCOUNTER — Encounter (HOSPITAL_COMMUNITY): Payer: Medicare Other

## 2019-10-14 ENCOUNTER — Other Ambulatory Visit: Payer: Self-pay

## 2019-10-14 ENCOUNTER — Encounter: Payer: Self-pay | Admitting: Orthopedic Surgery

## 2019-10-14 ENCOUNTER — Ambulatory Visit (INDEPENDENT_AMBULATORY_CARE_PROVIDER_SITE_OTHER): Payer: Medicare Other | Admitting: Orthopedic Surgery

## 2019-10-14 VITALS — Ht 71.0 in | Wt 162.0 lb

## 2019-10-14 DIAGNOSIS — M6701 Short Achilles tendon (acquired), right ankle: Secondary | ICD-10-CM

## 2019-10-14 DIAGNOSIS — Z89421 Acquired absence of other right toe(s): Secondary | ICD-10-CM

## 2019-10-14 DIAGNOSIS — E11621 Type 2 diabetes mellitus with foot ulcer: Secondary | ICD-10-CM

## 2019-10-14 DIAGNOSIS — L97509 Non-pressure chronic ulcer of other part of unspecified foot with unspecified severity: Secondary | ICD-10-CM

## 2019-10-14 NOTE — Progress Notes (Signed)
Office Visit Note   Patient: Jon Anderson           Date of Birth: 1951/03/24           MRN: [id number] Visit Date: 10/14/2019              Requested by: [name], MD [address] [place],  [place] [address] PCP: [name], MD  Chief Complaint  Patient presents with  . Right Foot - Routine Post Op     07/25/19 right 5th ray amputation         HPI: Patient is a 69 year old gentleman who is status post right foot fifth ray amputation.  He is currently taking the Trental once a day. nitroglycerin patch once a day occasionally, states does not use this every day gives him a headache especially on his dialysis days.  Silvadene dressing changes at night no dressing during the day and is also on Plavix.  He is wearing his postoperative shoe when he states the regular shoewear is painful.  He is status post vascular intervention of the right  The patient is currently weightbearing with a cane and a custom orthotic extra-depth shoe on the left as well as a postop shoe on the right.  He has pain throughout the ball of his foot bilaterally with weightbearing complains of painful calluses beneath the fifth metatarsal head on the left as well as to the tip of the great toe on the left callus trim  Assessment & Plan: Visit Diagnoses:  1. 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 Ischemic ulcer diabetic foot ([place])   3. Tightness of right heel cord     Plan: Recommended the Trental 3 times a day nitroglycerin patch daily continue with pressure offloading.  Continue with Silvadene dressing changes in the evening.  Discussed heel cord stretching.  Did trim the callus of beneath the fifth metatarsal head on the left  Follow-Up Instructions: Return in about 3 weeks (around 11/04/2019).   Ortho Exam  Patient is alert, oriented, no adenopathy, well-dressed, normal affect, normal respiratory effort.  On examination of the right lower extremity he does have 2+  pitting edema of the foot.  No erythema or sign of cellulitis. Examination patient has a small superficial ischemic ulcer on the tip of the great toe this is 5 mm in diameter and 1 mm deep.  This is covered with eschar.  1 drop of serous drainage.  There is no sausage digit swelling or cellulitis of the great toe.  Patient has a 10 mm ischemic area over the distal aspect of the fifth ray amputation.  Patient feels this is improved since last exam.  There is no drainage no surrounding erythema no odor no sign of infection there is no exposed bone no drainage no odor no cellulitis.  The left foot without erythema or warmth.  No edema.  He does have callus buildup beneath the fifth metatarsal head on the left.  There is heel cord tightness.  He has some callus buildup to the distal tip of the great toe on the left as well.  The fifth metatarsal head Wagner grade 1 ulcer trimmed with a 10 blade knife back to viable tissue.  1 drop of bloody drainage hemostasis obtained with a silver nitrate stick.  Band-Aid applied.  Imaging: No results found. No images are attached to the encounter.  Labs: Lab Results  Component Value Date   HGBA1C 6.4 (  H) 07/24/2019   HGBA1C 6.5 (H) 07/18/2018   HGBA1C 6.4 (H) 10/16/2017   ESRSEDRATE 22 (H) 07/24/2019   CRP <0.5 07/24/2019   REPTSTATUS 07/29/2019 FINAL 07/24/2019   CULT  07/24/2019    NO GROWTH 5 DAYS Performed at [facility], [place] [address], [place], [place] [address]      Lab Results  Component Value Date   ALBUMIN 3.7 07/24/2019   ALBUMIN 3.2 (L) 04/24/2019   ALBUMIN 3.7 11/04/2018   PREALBUMIN 28.0 07/24/2019    Lab Results  Component Value Date   MG 1.8 04/26/2019   MG 1.8 04/24/2019   MG 2.1 03/22/2017   No results found for: VD25OH  Lab Results  Component Value Date   PREALBUMIN 28.0 07/24/2019   CBC EXTENDED Latest Ref Rng & Units 08/21/2019 07/25/2019 07/24/2019  WBC 4.0 - 10.5 K/uL - 5.0 5.5  RBC 4.22 - 5.81 MIL/uL - 3.96(L)  4.37  HGB 13.0 - 17.0 g/dL 12.2(L) 11.5(L) 12.3(L)  HCT 39 - 52 % 36.0(L) 36.8(L) 40.2  PLT 150 - 400 K/uL - 235 259  NEUTROABS 1.7 - 7.7 K/uL - - 2.4  LYMPHSABS 0.7 - 4.0 K/uL - - 2.2     Body mass index is 22.59 kg/m.  Orders:  No orders of the defined types were placed in this encounter.  No orders of the defined types were placed in this encounter.    Procedures: No procedures performed  Clinical Data: No additional findings.  ROS:  All other systems negative, except as noted in the HPI. Review of Systems  Constitutional: Negative for chills and fever.  Cardiovascular: Positive for leg swelling.  Skin: Positive for wound. Negative for color change.    Objective: Vital Signs: Ht 5\' 11"  (1.803 m)   Wt 162 lb (73.5 kg)   BMI 22.59 kg/m   Specialty Comments:  No specialty comments available.  PMFS History: Patient Active Problem List   Diagnosis Date Noted  . 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 Ischemic ulcer diabetic foot ([place]) 09/09/2019  . Osteomyelitis of right foot ([place])   . Diabetic foot infection ([place]) 07/24/2019  . Chronic diastolic CHF (congestive heart failure) ([place]) 07/24/2019  . Diabetic ulcer of right midfoot associated with diabetes mellitus due to underlying condition, with fat layer exposed ([place])   . Gastroenteritis 04/24/2019  . Allergy, unspecified, initial encounter 01/27/2019  . DM neuropathy with neurologic complication ([place]) 59/74/1638  . GERD (gastroesophageal reflux disease) 02/28/2018  . Nephrolithiasis 02/28/2018  . Sciatic leg pain 02/28/2018  . Snores 02/28/2018  . Orthopnea 02/23/2018  . Elevated troponin 02/23/2018  . HNP (herniated nucleus pulposus), lumbar 07/03/2017  . Encounter for removal of sutures 06/01/2017  . Chest pain in adult 04/27/2017  . Restless leg syndrome, uncontrolled 04/27/2017  . 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 Anemia of chronic disease 03/17/2017    . ESRD (end stage renal disease) on dialysis ([place]) 03/17/2017  . Chronic chest pain 03/17/2017  . Type II diabetes mellitus ([place]) 03/17/2017  . Acute on chronic diastolic heart failure ([place]) 03/17/2017  . Acute heart failure ([place]) 03/17/2017  . Lumbar radiculopathy 01/16/2017  . Pre-transplant evaluation for kidney transplant 06/15/2016  . Pain, unspecified 04/17/2016  . Increased frequency of urination 12/17/2015  . Nocturia 12/17/2015  . Chest pain, non-cardiac 10/09/2015  . Hypercalcemia 05/10/2015  . Other fluid overload 02/24/2015  . 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 Fatty (change of) liver, not elsewhere classified 07/02/2014  . Aftercare including intermittent dialysis ([place]) 06/24/2014  . Diarrhea, unspecified 06/24/2014  . Fever, unspecified 06/24/2014  . Iron deficiency anemia, unspecified 06/24/2014  . Other specified coagulation defects ([place]) 06/24/2014  . Pruritus, unspecified 06/24/2014  . Secondary hyperparathyroidism of renal origin ([place]) 06/24/2014  . Type 2 diabetes mellitus with diabetic peripheral angiopathy without gangrene ([place]) 06/24/2014  . Acute on chronic renal failure ([place]) 06/16/2014  . Shoulder pain, left 12/15/2013  . Chest pain 12/15/2013  . Claudication ([place]) 12/11/2013  . PVD (peripheral vascular disease) ([place]) 12/11/2013  . Carotid artery disease ([place]) 09/30/2013  . Acute chest pain 11/15/2012  . Bruit 09/15/2010  . CAD (coronary artery disease) nonobstructive per cath 2012   . Hyperlipidemia LDL goal <70 07/22/2009  . Essential hypertension 07/22/2009   Past Medical History:  Diagnosis Date  . Anemia of chronic disease   . CAD (coronary artery disease)    a.  Myoview 4/11: EF 53%, no scar or ischemia   c. MV 2012 Nl perfusion, apical thinning.  No ischemia or scar.  EF 49%, appears greater by visual estimate.;  d.  Dob stress echo 12/13:  Negative Dob stress echo. There is no evidence of  ischemia.  The LVF is normal. b. Normal cors 2016.  . Carotid stenosis    a. <92% RICA, >11% LICA by duplex 12/4172  . Chronic chest pain    occ  . ESRD (end stage renal disease) on dialysis ([place])    M-W-F  . GERD (gastroesophageal reflux disease)   . HNP (herniated nucleus pulposus), lumbar   . HTN (hypertension)    echo 3/10: EF 60%, LAE  . Hyperlipidemia   . Nephrolithiasis    "passed them all"  . Peripheral arterial disease ([place])    a. s/p PTCA to L SFA.  Marland Kitchen Pneumonia yrs ago  . Restless legs   . Sciatic leg pain   . Sleep apnea    no cpap, needs to reschedule appointment to set up aquiring cpap  . Snores    a. presumed OSA, pt has refused sleep eval in past.  . Type II diabetes mellitus ([place])    no longer on medications, checks blood glucose at home  . Urinary frequency     Family History  Problem Relation Age of Onset  . Heart attack Sister        died @ 9  . Cancer Mother        died @ 58; unknown type  . Diabetes Brother        deceased  . Cirrhosis Father        alcohol related  . Diabetes Father   . Esophageal cancer Neg Hx   . Colon cancer Neg Hx   . Pancreatic cancer Neg Hx   . Stomach cancer Neg Hx     Past Surgical History:  Procedure Laterality Date  . ABDOMINAL AORTOGRAM W/LOWER EXTREMITY Bilateral 08/21/2019   Procedure: ABDOMINAL AORTOGRAM W/LOWER EXTREMITY;  Surgeon: Waynetta Sandy, MD;  Location: [facility];  Service: Cardiovascular;  Laterality: Bilateral;  . AMPUTATION Right 07/25/2019   Procedure: RIGHT 5th RAY AMPUTATION;  Surgeon: Newt Minion, MD;  Location: [name];  Service: Orthopedics;  Laterality: Right;  . ANGIOPLASTY / STENTING FEMORAL Left 12/11/2013   dr berry  . AV FISTULA PLACEMENT Left 03/19/2014   Procedure: CREATION OF ARTERIOVENOUS (AV) FISTULA  LEFT UPPER  ARM;  Surgeon: Mal Misty, MD;  Location: [facility];  Service: Vascular;  Laterality: Left;  . BACK SURGERY  01/2018   screws placed   . CARDIAC  CATHETERIZATION  2001 and 2010   . COLONOSCOPY W/ BIOPSIES AND POLYPECTOMY    . COLONOSCOPY WITH PROPOFOL N/A 08/01/2016   Procedure: COLONOSCOPY WITH PROPOFOL;  Surgeon: Carol Ada, MD;  Location: [facility];  Service: Endoscopy;  Laterality: N/A;  . CORONARY ARTERY BYPASS GRAFT  2019   [facility] x 1 bypass  . ESOPHAGOGASTRODUODENOSCOPY (EGD) WITH PROPOFOL N/A 08/01/2016   Procedure: ESOPHAGOGASTRODUODENOSCOPY (EGD) WITH PROPOFOL;  Surgeon: Carol Ada, MD;  Location: [facility];  Service: Endoscopy;  Laterality: N/A;  . FOOT FRACTURE SURGERY Right    ligament repair  . FRACTURE SURGERY     left forearm  . GRAFT APPLICATION Right 09/15/6718   Procedure: FAT GRAFT APPLICATION;  Surgeon: Evelina Bucy, DPM;  Location: [facility];  Service: Podiatry;  Laterality: Right;  . INGUINAL HERNIA REPAIR Left   . LEFT HEART CATH AND CORS/GRAFTS ANGIOGRAPHY N/A 04/27/2017   Procedure: LEFT HEART CATH AND CORS/GRAFTS ANGIOGRAPHY;  Surgeon: Leonie Man, MD;  Location: [facility];  Service: Cardiovascular;  Laterality: N/A;  . LEFT HEART CATHETERIZATION WITH CORONARY ANGIOGRAM N/A 06/22/2014   Procedure: LEFT HEART CATHETERIZATION WITH CORONARY ANGIOGRAM;  Surgeon: Troy Sine, MD;  Location: [facility];  Service: Cardiovascular;  Laterality: N/A;  . LOWER EXTREMITY ANGIOGRAM Left 12/11/2013   Procedure: LOWER EXTREMITY ANGIOGRAM;  Surgeon: Lorretta Harp, MD;  Location: [facility];  Service: Cardiovascular;  Laterality: Left;  . LUMBAR LAMINECTOMY/DECOMPRESSION MICRODISCECTOMY Right 07/03/2017   Procedure: MICRODISCECTOMY LUMBAR FIVE - SACRAL ONE RIGHT;  Surgeon: Consuella Lose, MD;  Location: [facility];  Service: Neurosurgery;  Laterality: Right;  . LUMBAR LAMINECTOMY/DECOMPRESSION MICRODISCECTOMY Right 10/19/2017   Procedure: MICRODISCECTOMY LUMBAR FIVE- SACRAL 1 ONE ;  Surgeon: Consuella Lose, MD;  Location: [facility];  Service: Neurosurgery;  Laterality: Right;  .  TONSILLECTOMY AND ADENOIDECTOMY    . WISDOM TOOTH EXTRACTION    . WOUND DEBRIDEMENT Right 05/13/2019   Procedure: DEBRIDEMENT WOUND;  Surgeon: Evelina Bucy, DPM;  Location: [facility];  Service: Podiatry;  Laterality: Right;   Social History   Occupational History  . Occupation: retired  Tobacco Use  . Smoking status: Former Smoker    Packs/day: 1.00    Years: 2.00    Pack years: 2.00    Types: Cigarettes    Quit date: 1976    Years since quitting: 45.5  . Smokeless tobacco: Never Used  . Tobacco comment: quit smoking 40 yrs ago  Vaping Use  . Vaping Use: Never used  Substance and Sexual Activity  . Alcohol use: No    Alcohol/week: 0.0 standard drinks    Comment: h/o social drinking  . Drug use: Not Currently    Types: Marijuana  . Sexual activity: Yes    Birth control/protection: None

## 2019-10-15 ENCOUNTER — Emergency Department (HOSPITAL_COMMUNITY)
Admission: EM | Admit: 2019-10-15 | Discharge: 2019-10-15 | Disposition: A | Discharge status: Home / Self Care | Payer: Medicare Other | Attending: Emergency Medicine | Admitting: Emergency Medicine

## 2019-10-15 ENCOUNTER — Emergency Department (HOSPITAL_COMMUNITY): Payer: Medicare Other

## 2019-10-15 ENCOUNTER — Other Ambulatory Visit: Payer: Self-pay

## 2019-10-15 ENCOUNTER — Encounter (HOSPITAL_COMMUNITY): Payer: Self-pay | Admitting: Emergency Medicine

## 2019-10-15 DIAGNOSIS — K219 Gastro-esophageal reflux disease without esophagitis: Secondary | ICD-10-CM | POA: Insufficient documentation

## 2019-10-15 DIAGNOSIS — R0602 Shortness of breath: Secondary | ICD-10-CM | POA: Insufficient documentation

## 2019-10-15 DIAGNOSIS — R0789 Other chest pain: Secondary | ICD-10-CM | POA: Diagnosis present

## 2019-10-15 LAB — BRAIN NATRIURETIC PEPTIDE: B Natriuretic Peptide: 186.1 pg/mL — ABNORMAL HIGH (ref 0.0–100.0)

## 2019-10-15 LAB — COMPREHENSIVE METABOLIC PANEL
ALT: 19 U/L (ref 0–44)
AST: 30 U/L (ref 15–41)
Albumin: 3.9 g/dL (ref 3.5–5.0)
Alkaline Phosphatase: 128 U/L — ABNORMAL HIGH (ref 38–126)
Anion gap: 15 (ref 5–15)
BUN: 34 mg/dL — ABNORMAL HIGH (ref 8–23)
CO2: 28 mmol/L (ref 22–32)
Calcium: 8.6 mg/dL — ABNORMAL LOW (ref 8.9–10.3)
Chloride: 90 mmol/L — ABNORMAL LOW (ref 98–111)
Creatinine, Ser: 9.03 mg/dL — ABNORMAL HIGH (ref 0.61–1.24)
GFR calc Af Amer: 6 mL/min — ABNORMAL LOW (ref >60)
GFR calc non Af Amer: 5 mL/min — ABNORMAL LOW (ref >60)
Glucose, Bld: 199 mg/dL — ABNORMAL HIGH (ref 70–99)
Potassium: 4.6 mmol/L (ref 3.5–5.1)
Sodium: 133 mmol/L — ABNORMAL LOW (ref 135–145)
Total Bilirubin: 0.8 mg/dL (ref 0.3–1.2)
Total Protein: 8 g/dL (ref 6.5–8.1)

## 2019-10-15 LAB — LIPASE, BLOOD: Lipase: 37 U/L (ref 11–51)

## 2019-10-15 LAB — CBC WITH DIFFERENTIAL/PLATELET
Abs Immature Granulocytes: 0.06 10*3/uL (ref 0.00–0.07)
Basophils Absolute: 0 10*3/uL (ref 0.0–0.1)
Basophils Relative: 0 %
Eosinophils Absolute: 0.1 10*3/uL (ref 0.0–0.5)
Eosinophils Relative: 1 %
HCT: 42 % (ref 39.0–52.0)
Hemoglobin: 13.7 g/dL (ref 13.0–17.0)
Immature Granulocytes: 1 %
Lymphocytes Relative: 8 %
Lymphs Abs: 0.7 10*3/uL (ref 0.7–4.0)
MCH: 29.5 pg (ref 26.0–34.0)
MCHC: 32.6 g/dL (ref 30.0–36.0)
MCV: 90.3 fL (ref 80.0–100.0)
Monocytes Absolute: 0.5 10*3/uL (ref 0.1–1.0)
Monocytes Relative: 5 %
Neutro Abs: 7.5 10*3/uL (ref 1.7–7.7)
Neutrophils Relative %: 85 %
Platelets: 217 10*3/uL (ref 150–400)
RBC: 4.65 MIL/uL (ref 4.22–5.81)
RDW: 15.3 % (ref 11.5–15.5)
WBC: 8.8 10*3/uL (ref 4.0–10.5)
nRBC: 0 % (ref 0.0–0.2)

## 2019-10-15 LAB — CBG MONITORING, ED: Glucose-Capillary: 192 mg/dL — ABNORMAL HIGH (ref 70–99)

## 2019-10-15 LAB — TROPONIN I (HIGH SENSITIVITY)
Troponin I (High Sensitivity): 21 ng/L — ABNORMAL HIGH (ref <18)
Troponin I (High Sensitivity): 26 ng/L — ABNORMAL HIGH (ref <18)

## 2019-10-15 MED ORDER — SODIUM CHLORIDE 0.9% FLUSH
3.0000 mL | Freq: Once | INTRAVENOUS | Status: AC
  Administered 2019-10-15: 3 mL via INTRAVENOUS

## 2019-10-15 MED ORDER — ALUM & MAG HYDROXIDE-SIMETH 200-200-20 MG/5ML PO SUSP
30.0000 mL | Freq: Once | ORAL | Status: AC
  Administered 2019-10-15: 30 mL via ORAL
  Filled 2019-10-15: qty 30

## 2019-10-15 NOTE — Progress Notes (Signed)
Consult to de-access A/V graft: Arrived to ED, pt not in room.

## 2019-10-15 NOTE — ED Triage Notes (Signed)
Arrived via EMS from dialysis which was completed. States developed chest pain constant and continued today. Did miss dialysis on Monday. EMS administered Aspirin 324 mg prior to arrival.

## 2019-10-15 NOTE — ED Provider Notes (Signed)
[facility] EMERGENCY DEPARTMENT Provider Note   CSN: [id number] Arrival date & time: 10/15/19  1205     History Chief Complaint  Patient presents with   Chest Pain    Jon Anderson is a 69 y.o. male with a past medical history of ESRD on dialysis Monday Wednesday Friday, hypertension, GERD, CAD diabetes presenting to the ED with a chief complaint of chest pain.  Reports having intermittent sharp lower chest/upper abdominal pain for the past 2 days.  Unfortunately he did not go to dialysis 2 days ago when pain began.  He did go to dialysis today and completed his session.  He was sent to the ER for worsening pain.  He reports history of similar chest pain in the past that "nothing really became of it."  He denies any shortness of breath, nausea, vomiting, diarrhea, cough or fever.  Reports bilateral lower extremity edema that has been intermittent.  Denies history of DVT or PE, MI, recent immobilization or sick contacts with similar symptoms.  States that symptoms have not changed since taking aspirin given by EMS.  HPI     Past Medical History:  Diagnosis Date   Anemia of chronic disease    CAD (coronary artery disease)    a.  Myoview 4/11: EF 53%, no scar or ischemia   c. MV 2012 Nl perfusion, apical thinning.  No ischemia or scar.  EF 49%, appears greater by visual estimate.;  d.  Dob stress echo 12/13:  Negative Dob stress echo. There is no evidence of ischemia.  The LVF is normal. b. Normal cors 2016.   Carotid stenosis    a. <74% RICA, >12% LICA by duplex 11/7865   Chronic chest pain    occ   ESRD (end stage renal disease) on dialysis [facility])    M-W-F   GERD (gastroesophageal reflux disease)    HNP (herniated nucleus pulposus), lumbar    HTN (hypertension)    echo 3/10: EF 60%, LAE   Hyperlipidemia    Nephrolithiasis    "passed them all"   Peripheral arterial disease ([place])    a. s/p PTCA to L SFA.   Pneumonia yrs ago   Restless legs     Sciatic leg pain    Sleep apnea    no cpap, needs to reschedule appointment to set up aquiring cpap   Snores    a. presumed OSA, pt has refused sleep eval in past.   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[place])    Diabetic foot infection ([place]) 07/24/2019   Chronic diastolic CHF (congestive heart failure) ([place]) 07/24/2019   Diabetic ulcer of right midfoot associated with diabetes mellitus due to underlying condition, with fat layer exposed ([place])    Gastroenteritis 04/24/2019   Allergy, unspecified, initial encounter 01/27/2019   DM neuropathy with neurologic complication ([place]) 67/20/9470   GERD (gastroesophageal reflux disease) 02/28/2018   Nephrolithiasis 02/28/2018   Sciatic leg pain 02/28/2018   Snores 02/28/2018   Orthopnea 02/23/2018   Elevated troponin 02/23/2018   HNP (herniated nucleus pulposus), lumbar 07/03/2017   Encounter for removal of sutures 06/01/2017   Chest pain in adult 04/27/2017  Restless leg syndrome, uncontrolled 04/27/2017   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/[facility] 03/17/2017   Anemia of chronic disease 03/17/2017   ESRD (end stage renal disease) on dialysis ([place]) 03/17/2017   Chronic chest pain 03/17/2017   Type II diabetes mellitus ([place]) 03/17/2017   Acute on chronic diastolic heart failure ([place]) 03/17/2017   Acute heart failure ([place]) 03/17/2017   Lumbar radiculopathy 01/16/2017   Pre-transplant evaluation for kidney transplant 06/15/2016   Pain, unspecified 04/17/2016   Increased frequency of urination 12/17/2015   Nocturia 12/17/2015   Chest pain, non-cardiac 10/09/2015   Hypercalcemia 05/10/2015   Other fluid overload 02/24/2015    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[place]) 10/03/2014   Fatty (change of) liver, not elsewhere classified 07/02/2014   Aftercare including intermittent dialysis ([place]) 06/24/2014   Diarrhea, unspecified 06/24/2014   Fever, unspecified 06/24/2014   Iron deficiency anemia, unspecified 06/24/2014   Other specified coagulation defects ([place]) 06/24/2014   Pruritus, unspecified 06/24/2014   Secondary hyperparathyroidism of renal origin ([place]) 06/24/2014   Type 2 diabetes mellitus with diabetic peripheral angiopathy without gangrene ([place]) 06/24/2014   Acute on chronic renal failure ([place]) 06/16/2014   Shoulder pain, left 12/15/2013   Chest pain 12/15/2013   Claudication ([place]) 12/11/2013   PVD (peripheral vascular disease) ([place]) 12/11/2013   Carotid artery disease ([place]) 09/30/2013   Acute chest pain 11/15/2012   Bruit 09/15/2010   CAD (coronary artery disease) nonobstructive per cath 2012    Hyperlipidemia LDL goal <70 07/22/2009   Essential hypertension 07/22/2009    Past Surgical History:  Procedure Laterality Date   ABDOMINAL AORTOGRAM W/LOWER EXTREMITY Bilateral 08/21/2019   Procedure: ABDOMINAL AORTOGRAM W/LOWER EXTREMITY;  Surgeon: Waynetta Sandy, MD;  Location: [place] [facility];  Service: Cardiovascular;  Laterality: Bilateral;   AMPUTATION Right 07/25/2019   Procedure: RIGHT 5th RAY AMPUTATION;  Surgeon: Newt Minion, MD;  Location: [place];  Service: Orthopedics;  Laterality: Right;   ANGIOPLASTY / STENTING FEMORAL Left 12/11/2013   dr berry   AV FISTULA PLACEMENT Left 03/19/2014   Procedure: CREATION OF ARTERIOVENOUS (AV) FISTULA  LEFT UPPER ARM;  Surgeon: Mal Misty, MD;  Location: [place];  Service: Vascular;  Laterality: Left;   BACK SURGERY  01/2018   screws placed    CARDIAC CATHETERIZATION  2001 and 2010    COLONOSCOPY W/ BIOPSIES AND POLYPECTOMY     COLONOSCOPY WITH PROPOFOL N/A 08/01/2016   Procedure:  COLONOSCOPY WITH PROPOFOL;  Surgeon: Carol Ada, MD;  Location: [facility];  Service: Endoscopy;  Laterality: N/A;   CORONARY ARTERY BYPASS GRAFT  2019   [facility] x [facility] bypass   ESOPHAGOGASTRODUODENOSCOPY (EGD) WITH PROPOFOL N/A 08/01/2016   Procedure: ESOPHAGOGASTRODUODENOSCOPY (EGD) WITH PROPOFOL;  Surgeon: Carol Ada, MD;  Location: [facility];  Service: Endoscopy;  Laterality: N/A;   FOOT FRACTURE SURGERY Right    ligament repair   FRACTURE SURGERY     left forearm   GRAFT APPLICATION Right 08/10/9765   Procedure: FAT GRAFT APPLICATION;  Surgeon: Evelina Bucy, DPM;  Location: [place];  Service: Podiatry;  Laterality: Right;   INGUINAL HERNIA REPAIR Left    LEFT HEART CATH AND CORS/GRAFTS ANGIOGRAPHY N/A 04/27/2017   Procedure: LEFT HEART CATH AND CORS/GRAFTS ANGIOGRAPHY;  Surgeon: Leonie Man, MD;  Location: [place] [facility];  Service: Cardiovascular;  Laterality: N/A;   LEFT HEART CATHETERIZATION WITH CORONARY ANGIOGRAM N/A 06/22/2014   Procedure: LEFT HEART CATHETERIZATION WITH CORONARY ANGIOGRAM;  Surgeon: Troy Sine, MD;  Location: [facility] CATH LAB;  Service: Cardiovascular;  Laterality: N/A;   LOWER EXTREMITY ANGIOGRAM Left 12/11/2013   Procedure: LOWER EXTREMITY ANGIOGRAM;  Surgeon: Lorretta Harp, MD;  Location: [facility] CATH LAB;  Service: Cardiovascular;  Laterality: Left;   LUMBAR LAMINECTOMY/DECOMPRESSION MICRODISCECTOMY Right 07/03/2017   Procedure: MICRODISCECTOMY LUMBAR FIVE - SACRAL ONE RIGHT;  Surgeon: Consuella Lose, MD;  Location: [place];  Service: Neurosurgery;  Laterality: Right;   LUMBAR LAMINECTOMY/DECOMPRESSION MICRODISCECTOMY Right 10/19/2017   Procedure: MICRODISCECTOMY LUMBAR FIVE- SACRAL 1 ONE ;  Surgeon: Consuella Lose, MD;  Location: [place];  Service: Neurosurgery;  Laterality: Right;   TONSILLECTOMY AND ADENOIDECTOMY     WISDOM TOOTH EXTRACTION     WOUND DEBRIDEMENT Right 05/13/2019   Procedure: DEBRIDEMENT WOUND;   Surgeon: Evelina Bucy, DPM;  Location: [place];  Service: Podiatry;  Laterality: Right;       Family History  Problem Relation Age of Onset   Heart attack Sister        died @ 28   Cancer Mother        died @ 2; unknown type   Diabetes Brother        deceased   Cirrhosis Father        alcohol related   Diabetes Father    Esophageal cancer Neg Hx    Colon cancer Neg Hx    Pancreatic cancer Neg Hx    Stomach cancer Neg Hx     Social History   Tobacco Use   Smoking status: Former Smoker    Packs/day: 1.00    Years: 2.00    Pack years: 2.00    Types: Cigarettes    Quit date: 1976    Years since quitting: 45.5   Smokeless tobacco: Never Used   Tobacco comment: quit smoking 40 yrs ago  Vaping Use   Vaping Use: Never used  Substance Use Topics   Alcohol use: No    Alcohol/week: 0.0 standard drinks    Comment: h/o social drinking   Drug use: Not Currently    Types: Marijuana    Home Medications Prior to Admission medications   Medication Sig Start Date End Date Taking? Authorizing Provider  acetaminophen (TYLENOL) 500 MG tablet Take 1,000 mg by mouth every 4 (four) hours as needed for moderate pain or headache.    Yes [provider]  aspirin EC 81 MG tablet Take 1 tablet (81 mg total) by mouth daily. 05/28/12  Yes Weaver, Scott T, PA-C  atorvastatin (LIPITOR) 40 MG tablet Take 1 tablet (40 mg total) by mouth daily at 6 PM. 12/13/13  Yes Brett Canales, PA-C  calcium acetate (PHOSLO) 667 MG capsule Take 3 capsules (2,001 mg total) by mouth 3 (three) times daily with meals. Patient taking differently: Take 1,334 mg by mouth 2 (two) times daily.  10/09/15  Yes Short, Noah Delaine, MD  clopidogrel (PLAVIX) 75 MG tablet Take 75 mg by mouth daily. 07/29/18  Yes [provider]  gabapentin (NEURONTIN) 100 MG capsule Take 100 mg by mouth 2 (two) times daily.    Yes [provider]  lipase/protease/amylase (CREON) 36000  UNITS CPEP capsule Take 36,000 Units by mouth daily.   Yes [provider]  multivitamin (RENA-VIT) TABS tablet Take 1 tablet by mouth at bedtime. 06/23/14  Yes Domenic Polite, MD  nitroGLYCERIN (NITROSTAT) 0.4 MG SL tablet Place 1 tablet (0.4 mg total) under the tongue every 5 (five) minutes as needed  for chest pain. 03/20/12  Yes Barrett, Evelene Croon, PA-C  ONETOUCH ULTRA test strip  04/28/19  Yes [provider]  oxyCODONE (OXY IR/ROXICODONE) 5 MG immediate release tablet Take 5 mg by mouth every 4 (four) hours as needed for severe pain.   Yes [provider]  pantoprazole (PROTONIX) 40 MG tablet Take 40 mg by mouth daily.   Yes [provider]  pentoxifylline (TRENTAL) 400 MG CR tablet Take 1 tablet (400 mg total) by mouth daily. 08/26/19  Yes Persons, Bevely Palmer, PA  polyethylene glycol (MIRALAX / GLYCOLAX) packet Take 17 g by mouth daily as needed for moderate constipation. 03/22/17  Yes Aline August, MD  senna-docusate (SENOKOT-S) 8.6-50 MG tablet Take 2 tablets by mouth 2 (two) times daily as needed for mild constipation. 07/28/19 07/27/20 Yes [place] Eric J, DO  silver sulfADIAZINE (SILVADENE) 1 % cream Apply 1 application topically daily. 09/09/19  Yes Dondra Prader R, NP  clotrimazole (LOTRIMIN) 1 % cream Apply between toes and under toes twice daily for 4 weeks. Patient not taking: Reported on 10/15/2019 09/17/18   Marzetta Board, DPM  nitroGLYCERIN (NITRODUR - DOSED IN MG/24 HR) 0.2 mg/hr patch Place 1 patch (0.2 mg total) onto the skin daily. Patient not taking: Reported on 10/15/2019 08/07/19   Newt Minion, MD    Allergies    Kiwi extract, Flexeril [cyclobenzaprine], and Tape  Review of Systems   Review of Systems  Constitutional: Negative for appetite change, chills and fever.  HENT: Negative for ear pain, rhinorrhea, sneezing and sore throat.   Eyes: Negative for photophobia and visual disturbance.  Respiratory: Negative for cough, chest  tightness, shortness of breath and wheezing.   Cardiovascular: Positive for chest pain and leg swelling. Negative for palpitations.  Gastrointestinal: Positive for abdominal pain. Negative for blood in stool, constipation, diarrhea, nausea and vomiting.  Genitourinary: Negative for dysuria, hematuria and urgency.  Musculoskeletal: Negative for myalgias.  Skin: Negative for rash.  Neurological: Negative for dizziness, weakness and light-headedness.    Physical Exam Updated Vital Signs BP 110/86    Pulse 94    Temp 98 F (36.7 C) (Oral)    Resp (!) 24    Wt 71.2 kg    SpO2 98%    BMI 21.90 kg/m   Physical Exam Vitals and nursing note reviewed.  Constitutional:      General: He is not in acute distress.    Appearance: He is well-developed.  HENT:     Head: Normocephalic and atraumatic.     Nose: Nose normal.  Eyes:     General: No scleral icterus.       Left eye: No discharge.     Conjunctiva/sclera: Conjunctivae normal.  Cardiovascular:     Rate and Rhythm: Normal rate and regular rhythm.     Heart sounds: Normal heart sounds. No murmur heard.  No friction rub. No gallop.   Pulmonary:     Effort: Pulmonary effort is normal. No respiratory distress.     Breath sounds: Normal breath sounds.  Chest:     Chest wall: Tenderness present.    Abdominal:     General: Bowel sounds are normal. There is no distension.     Palpations: Abdomen is soft.     Tenderness: There is abdominal tenderness. There is no guarding.  Musculoskeletal:        General: Normal range of motion.     Cervical back: Normal range of motion and neck supple.  Skin:  General: Skin is warm and dry.     Findings: No rash.  Neurological:     Mental Status: He is alert.     Motor: No abnormal muscle tone.     Coordination: Coordination normal.     ED Results / Procedures / Treatments   Labs (all labs ordered are listed, but only abnormal results are displayed) Labs Reviewed  COMPREHENSIVE METABOLIC  PANEL - Abnormal; Notable for the following components:      Result Value   Sodium 133 (*)    Chloride 90 (*)    Glucose, Bld 199 (*)    BUN 34 (*)    Creatinine, Ser 9.03 (*)    Calcium 8.6 (*)    Alkaline Phosphatase 128 (*)    GFR calc non Af Amer 5 (*)    GFR calc Af Amer 6 (*)    All other components within normal limits  BRAIN NATRIURETIC PEPTIDE - Abnormal; Notable for the following components:   B Natriuretic Peptide 186.1 (*)    All other components within normal limits  CBG MONITORING, ED - Abnormal; Notable for the following components:   Glucose-Capillary 192 (*)    All other components within normal limits  TROPONIN I (HIGH SENSITIVITY) - Abnormal; Notable for the following components:   Troponin I (High Sensitivity) 21 (*)    All other components within normal limits  TROPONIN I (HIGH SENSITIVITY) - Abnormal; Notable for the following components:   Troponin I (High Sensitivity) 26 (*)    All other components within normal limits  LIPASE, BLOOD  CBC WITH DIFFERENTIAL/PLATELET    EKG EKG Interpretation  Date/Time:  Wednesday October 15 2019 12:11:31 EDT Ventricular Rate:  91 PR Interval:    QRS Duration: 100 QT Interval:  394 QTC Calculation: 485 R Axis:   89 Text Interpretation: Sinus rhythm Probable anteroseptal infarct, recent Abnormal T, consider ischemia, inferior leads No significant change since 07/24/2019 Confirmed by Veryl Speak [id number]) on 10/15/2019 12:51:57 PM   Radiology DG Chest 2 View  Result Date: 10/15/2019 CLINICAL DATA:  Chest pain post dialysis EXAM: CHEST - 2 VIEW COMPARISON:  04/24/2019 FINDINGS: Upper normal heart size. Mediastinal contours and pulmonary vascularity normal. Atherosclerotic calcification aorta. Mild elevation of RIGHT diaphragm with minimal RIGHT basilar atelectasis. Remaining lungs clear. No pleural effusion or pneumothorax. Osseous structures unremarkable. IMPRESSION: Minimal RIGHT basilar atelectasis. Electronically Signed    By: Lavonia Dana M.D.   On: 10/15/2019 13:22    Procedures Procedures (including critical care time)  Medications Ordered in ED Medications  sodium chloride flush (NS) 0.9 % injection 3 mL (3 mLs Intravenous Given 10/15/19 1234)  alum & mag hydroxide-simeth (MAALOX/MYLANTA) 200-200-20 MG/5ML suspension 30 mL (30 mLs Oral Given 10/15/19 1718)    ED Course  I have reviewed the triage vital signs and the nursing notes.  Pertinent labs & imaging results that were available during my care of the patient were reviewed by me and considered in my medical decision making (see chart for details).    MDM Rules/Calculators/A&P                          69 year old male with a past medical history of ESRD on dialysis Monday Wednesday Friday, hypertension, GERD, CAD, diabetes presenting to the ED with a chief complaint of chest pain.  Reports intermittent right lower chest/upper abdominal pain for the past 2 days.  He admits that he missed dialysis on 10/13/2019 but did  go today and completed the session without difficulty.  He was brought here from dialysis due to complaints of chest pain.  He had similar chest pain in the past without any concerning findings in workup.  Denies any leg swelling.  On exam he is tender in the mid chest/upper abdomen area mostly on his ribs.  Abdomen is nontender.  He denies any diarrhea, vomiting, shortness of breath or cough.  Vital signs within normal limits, he is not hypoxic, tachycardic or tachypneic.  He is resting comfortably.  No lower extremity edema, erythema or calf tenderness bilaterally.  G here shows sinus rhythm, no changes from prior tracings.  Initial troponin is 21 which is similar to priors.  Chest x-ray is unremarkable.  CMP, CBC, BNP are unremarkable.  Lipase unremarkable.  Delta troponin is 26, similar to his baseline.  Patient was given GI cocktail here with improvement in his symptoms.  He remains in no acute distress.  Doubt ACS as the cause of his symptoms as  his troponin is not significantly elevated and his EKG shows no ischemic changes. No pleuritic chest pain, shortness of breath or leg swelling so I doubt DVT/PE as the cause of her symptoms. No hernia or pneumothorax on chest x-ray.  Question if this could be caused by GERD which she has a history of, he reports constant belching.  We will have him follow-up with PCP, cardiology and return for worsening symptoms. Patient discussed with and seen by Dr. Stark Jock.  All imaging, if done today, including plain films, CT scans, and ultrasounds, independently reviewed by me, and interpretations confirmed via formal radiology reads.  Patient is hemodynamically stable, in NAD, and able to ambulate in the ED. Evaluation does not show pathology that would require ongoing emergent intervention or inpatient treatment. I explained the diagnosis to the patient. Pain has been managed and has no complaints prior to discharge. Patient is comfortable with above plan and is stable for discharge at this time. All questions were answered prior to disposition. Strict return precautions for returning to the ED were discussed. Encouraged follow up with PCP.   An After Visit Summary was printed and given to the patient.   Portions of this note were generated with [employer]. Dictation errors may occur despite best attempts at proofreading.  Final Clinical Impression(s) / ED Diagnoses Final diagnoses:  Chest wall pain  Gastroesophageal reflux disease, unspecified whether esophagitis present    Rx / DC Orders ED Discharge Orders    None       Delia Heady, PA-C 10/15/19 1814    Veryl Speak, MD 10/17/19 2303

## 2019-10-15 NOTE — Progress Notes (Signed)
HD access assessment: Pressure clamp in place above HD graft from dialysis. No bleeding noted. Clamp removed. Pt voiced understanding of signs to report to RN.

## 2019-10-15 NOTE — Discharge Instructions (Addendum)
Continue your home medications as previously prescribed. Follow-up with your primary care provider and cardiologist. Return to the ER for worsening chest pain, abdominal pain, vomiting, leg swelling or shortness of breath.

## 2019-11-04 ENCOUNTER — Ambulatory Visit (INDEPENDENT_AMBULATORY_CARE_PROVIDER_SITE_OTHER): Payer: Medicare Other | Admitting: Orthopedic Surgery

## 2019-11-04 ENCOUNTER — Ambulatory Visit: Payer: Self-pay

## 2019-11-04 ENCOUNTER — Encounter: Payer: Self-pay | Admitting: Orthopedic Surgery

## 2019-11-04 VITALS — Ht 71.0 in | Wt 157.0 lb

## 2019-11-04 DIAGNOSIS — I739 Peripheral vascular disease, unspecified: Secondary | ICD-10-CM

## 2019-11-04 DIAGNOSIS — L97511 Non-pressure chronic ulcer of other part of right foot limited to breakdown of skin: Secondary | ICD-10-CM | POA: Diagnosis not present

## 2019-11-04 DIAGNOSIS — Z89421 Acquired absence of other right toe(s): Secondary | ICD-10-CM

## 2019-11-04 DIAGNOSIS — M869 Osteomyelitis, unspecified: Secondary | ICD-10-CM

## 2019-11-04 NOTE — Progress Notes (Signed)
Office Visit Note   Patient: Jon Anderson           Date of Birth: [birth year]           MRN: [id number] Visit Date: 11/04/2019              Requested by: [name], MD [name] [place],  [place] [address] PCP: [name], MD  Chief Complaint  Patient presents with  . Right Foot - Routine Post Op      07/25/19 right 5th ray amputation       HPI: Patient is a 69 year old gentleman who is status post revascularization to the right lower extremity with a stent placed by Dr. Vella Redhead.  Patient is also over 3 months out from a right fifth ray amputation.  He has been using Trental nitroglycerin patches and heel cord stretching.  He complains of painful necrotic ulcers over the fourth metatarsal head laterally and the tip of the great toe.  Patient states that his father had peripheral vascular disease as well and that his father died of the operating room table while undergoing a below-knee amputation.  Assessment & Plan: Visit Diagnoses:  1. 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 Ulcer of toe of right foot, limited to breakdown of skin ([place])   3. Osteomyelitis of great toe of right foot ([place])   4. PVD (peripheral vascular disease) ([place])     Plan: Discussed with the patient that we should proceed with a transmetatarsal amputation.  Discussed the biggest risk is wound healing with his calcified vessels and that he will need to be strictly nonweightbearing after surgery.  Anticipate we could proceed with surgery next Wednesday or Friday patient will need to be admitted and placed on dialysis and patient may need discharge to skilled nursing.  Follow-Up Instructions: No follow-ups on file.   Ortho Exam  Patient is alert, oriented, no adenopathy, well-dressed, normal affect, normal respiratory effort. Examination patient has swelling of the right foot compared to the left left foot does have a callus underneath the fifth metatarsal heads but  no open ulcers no cellulitis.  The ulcer over the tip of the right great toe and right fourth toe are necrotic and painful to touch.  Both ulcers probes to bone with a Q-tip.  A Doppler was used and patient does have a biphasic dorsalis pedis and posterior tibial pulse.  Imaging: No results found. No images are attached to the encounter.  Labs: Lab Results  Component Value Date   HGBA1C 6.4 (H) 07/24/2019   HGBA1C 6.5 (H) 07/18/2018   HGBA1C 6.4 (H) 10/16/2017   ESRSEDRATE 22 (H) 07/24/2019   CRP <0.5 07/24/2019   REPTSTATUS 07/29/2019 FINAL 07/24/2019   CULT  07/24/2019    NO GROWTH 5 DAYS Performed at [facility], [place] [address], [place], [place] [address]      Lab Results  Component Value Date   ALBUMIN 3.9 10/15/2019   ALBUMIN 3.7 07/24/2019   ALBUMIN 3.2 (L) 04/24/2019   PREALBUMIN 28.0 07/24/2019    Lab Results  Component Value Date   MG 1.8 04/26/2019   MG 1.8 04/24/2019   MG 2.1 03/22/2017   No results found for: VD25OH  Lab Results  Component Value Date   PREALBUMIN 28.0 07/24/2019   CBC EXTENDED Latest Ref Rng & Units 10/15/2019 08/21/2019 07/25/2019  WBC 4.0 - 10.5 K/uL 8.8 - 5.0  RBC 4.22 - 5.81  MIL/uL 4.65 - 3.96(L)  HGB 13.0 - 17.0 g/dL 13.7 12.2(L) 11.5(L)  HCT 39 - 52 % 42.0 36.0(L) 36.8(L)  PLT 150 - 400 K/uL 217 - 235  NEUTROABS 1.7 - 7.7 K/uL 7.5 - -  LYMPHSABS 0.7 - 4.0 K/uL 0.7 - -     Body mass index is 21.9 kg/m.  Orders:  Orders Placed This Encounter  Procedures  . XR Foot 2 Views Right   No orders of the defined types were placed in this encounter.    Procedures: No procedures performed  Clinical Data: No additional findings.  ROS:  All other systems negative, except as noted in the HPI. Review of Systems  Objective: Vital Signs: Ht 5\' 11"  (1.803 m)   Wt 157 lb (71.2 kg)   BMI 21.90 kg/m   Specialty Comments:  No specialty comments available.  PMFS History: Patient Active Problem List   Diagnosis Date  Noted  . 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 Ischemic ulcer diabetic foot ([place]) 09/09/2019  . Osteomyelitis of right foot ([place])   . Diabetic foot infection ([place]) 07/24/2019  . Chronic diastolic CHF (congestive heart failure) ([place]) 07/24/2019  . Diabetic ulcer of right midfoot associated with diabetes mellitus due to underlying condition, with fat layer exposed ([place])   . Gastroenteritis 04/24/2019  . Allergy, unspecified, initial encounter 01/27/2019  . DM neuropathy with neurologic complication ([place]) 38/01/1750  . GERD (gastroesophageal reflux disease) 02/28/2018  . Nephrolithiasis 02/28/2018  . Sciatic leg pain 02/28/2018  . Snores 02/28/2018  . Orthopnea 02/23/2018  . Elevated troponin 02/23/2018  . HNP (herniated nucleus pulposus), lumbar 07/03/2017  . Encounter for removal of sutures 06/01/2017  . Chest pain in adult 04/27/2017  . Restless leg syndrome, uncontrolled 04/27/2017  . 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 Anemia of chronic disease 03/17/2017  . ESRD (end stage renal disease) on dialysis ([place]) 03/17/2017  . Chronic chest pain 03/17/2017  . Type II diabetes mellitus ([place]) 03/17/2017  . Acute on chronic diastolic heart failure ([place]) 03/17/2017  . Acute heart failure ([place]) 03/17/2017  . Lumbar radiculopathy 01/16/2017  . Pre-transplant evaluation for kidney transplant 06/15/2016  . Pain, unspecified 04/17/2016  . Increased frequency of urination 12/17/2015  . Nocturia 12/17/2015  . Chest pain, non-cardiac 10/09/2015  . Hypercalcemia 05/10/2015  . Other fluid overload 02/24/2015  . 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 Fatty (change of) liver, not elsewhere classified 07/02/2014  . Aftercare including intermittent dialysis ([place]) 06/24/2014  . Diarrhea, unspecified 06/24/2014  . Fever, unspecified 06/24/2014  . Iron deficiency anemia, unspecified 06/24/2014  . Other  specified coagulation defects ([place]) 06/24/2014  . Pruritus, unspecified 06/24/2014  . Secondary hyperparathyroidism of renal origin ([place]) 06/24/2014  . Type 2 diabetes mellitus with diabetic peripheral angiopathy without gangrene ([place]) 06/24/2014  . Acute on chronic renal failure ([place]) 06/16/2014  . Shoulder pain, left 12/15/2013  . Chest pain 12/15/2013  . Claudication ([place]) 12/11/2013  . PVD (peripheral vascular disease) ([place]) 12/11/2013  . Carotid artery disease ([place]) 09/30/2013  . Acute chest pain 11/15/2012  . Bruit 09/15/2010  . CAD (coronary artery disease) nonobstructive per cath 2012   . Hyperlipidemia LDL goal <70 07/22/2009  . Essential hypertension 07/22/2009   Past Medical History:  Diagnosis Date  . Anemia of chronic disease   . CAD (coronary artery disease)    a.  Myoview 4/11: EF 53%, no  scar or ischemia   c. MV 2012 Nl perfusion, apical thinning.  No ischemia or scar.  EF 49%, appears greater by visual estimate.;  d.  Dob stress echo 12/13:  Negative Dob stress echo. There is no evidence of ischemia.  The LVF is normal. b. Normal cors 2016.  . Carotid stenosis    a. <08% RICA, >14% LICA by duplex 07/8183  . Chronic chest pain    occ  . ESRD (end stage renal disease) on dialysis ([place])    M-W-F  . GERD (gastroesophageal reflux disease)   . HNP (herniated nucleus pulposus), lumbar   . HTN (hypertension)    echo 3/10: EF 60%, LAE  . Hyperlipidemia   . Nephrolithiasis    "passed them all"  . Peripheral arterial disease ([place])    a. s/p PTCA to L SFA.  Marland Kitchen Pneumonia yrs ago  . Restless legs   . Sciatic leg pain   . Sleep apnea    no cpap, needs to reschedule appointment to set up aquiring cpap  . Snores    a. presumed OSA, pt has refused sleep eval in past.  . Type II diabetes mellitus ([place])    no longer on medications, checks blood glucose at home  . Urinary frequency     Family History  Problem Relation Age of Onset  . Heart attack Sister        died @ 58    . Cancer Mother        died @ 78; unknown type  . Diabetes Brother        deceased  . Cirrhosis Father        alcohol related  . Diabetes Father   . Esophageal cancer Neg Hx   . Colon cancer Neg Hx   . Pancreatic cancer Neg Hx   . Stomach cancer Neg Hx     Past Surgical History:  Procedure Laterality Date  . ABDOMINAL AORTOGRAM W/LOWER EXTREMITY Bilateral 08/21/2019   Procedure: ABDOMINAL AORTOGRAM W/LOWER EXTREMITY;  Surgeon: Waynetta Sandy, MD;  Location: [facility];  Service: Cardiovascular;  Laterality: Bilateral;  . AMPUTATION Right 07/25/2019   Procedure: RIGHT 5th RAY AMPUTATION;  Surgeon: Newt Minion, MD;  Location: [facility];  Service: Orthopedics;  Laterality: Right;  . ANGIOPLASTY / STENTING FEMORAL Left 12/11/2013   dr berry  . AV FISTULA PLACEMENT Left 03/19/2014   Procedure: CREATION OF ARTERIOVENOUS (AV) FISTULA  LEFT UPPER ARM;  Surgeon: Mal Misty, MD;  Location: [facility];  Service: Vascular;  Laterality: Left;  . BACK SURGERY  01/2018   screws placed   . CARDIAC CATHETERIZATION  2001 and 2010   . COLONOSCOPY W/ BIOPSIES AND POLYPECTOMY    . COLONOSCOPY WITH PROPOFOL N/A 08/01/2016   Procedure: COLONOSCOPY WITH PROPOFOL;  Surgeon: Carol Ada, MD;  Location: [facility];  Service: Endoscopy;  Laterality: N/A;  . CORONARY ARTERY BYPASS GRAFT  2019   [facility] x [facility] bypass  . ESOPHAGOGASTRODUODENOSCOPY (EGD) WITH PROPOFOL N/A 08/01/2016   Procedure: ESOPHAGOGASTRODUODENOSCOPY (EGD) WITH PROPOFOL;  Surgeon: Carol Ada, MD;  Location: [facility];  Service: Endoscopy;  Laterality: N/A;  . FOOT FRACTURE SURGERY Right    ligament repair  . FRACTURE SURGERY     left forearm  . GRAFT APPLICATION Right 09/11/1495   Procedure: FAT GRAFT APPLICATION;  Surgeon: Evelina Bucy, DPM;  Location: [facility];  Service: Podiatry;  Laterality: Right;  . INGUINAL HERNIA REPAIR Left   .  LEFT HEART CATH AND CORS/GRAFTS ANGIOGRAPHY N/A 04/27/2017    Procedure: LEFT HEART CATH AND CORS/GRAFTS ANGIOGRAPHY;  Surgeon: Leonie Man, MD;  Location: [facility];  Service: Cardiovascular;  Laterality: N/A;  . LEFT HEART CATHETERIZATION WITH CORONARY ANGIOGRAM N/A 06/22/2014   Procedure: LEFT HEART CATHETERIZATION WITH CORONARY ANGIOGRAM;  Surgeon: Troy Sine, MD;  Location[facility];  Service: Cardiovascular;  Laterality: N/A;  . LOWER EXTREMITY ANGIOGRAM Left 12/11/2013   Procedure: LOWER EXTREMITY ANGIOGRAM;  Surgeon: Lorretta Harp, MD;  Location: [facility];  Service: Cardiovascular;  Laterality: Left;  . LUMBAR LAMINECTOMY/DECOMPRESSION MICRODISCECTOMY Right 07/03/2017   Procedure: MICRODISCECTOMY LUMBAR FIVE - SACRAL ONE RIGHT;  Surgeon: Consuella Lose, MD;  Location: [facility];  Service: Neurosurgery;  Laterality: Right;  . LUMBAR LAMINECTOMY/DECOMPRESSION MICRODISCECTOMY Right 10/19/2017   Procedure: MICRODISCECTOMY LUMBAR FIVE- SACRAL 1 ONE ;  Surgeon: Consuella Lose, MD;  Location: [facility];  Service: Neurosurgery;  Laterality: Right;  . TONSILLECTOMY AND ADENOIDECTOMY    . WISDOM TOOTH EXTRACTION    . WOUND DEBRIDEMENT Right 05/13/2019   Procedure: DEBRIDEMENT WOUND;  Surgeon: Evelina Bucy, DPM;  Location[facility];  Service: Podiatry;  Laterality: Right;   Social History   Occupational History  . Occupation: retired  Tobacco Use  . Smoking status: Former Smoker    Packs/day: 1.00    Years: 2.00    Pack years: 2.00    Types: Cigarettes    Quit date: 1976    Years since quitting: 45.6  . Smokeless tobacco: Never Used  . Tobacco comment: quit smoking 40 yrs ago  Vaping Use  . Vaping Use: Never used  Substance and Sexual Activity  . Alcohol use: No    Alcohol/week: 0.0 standard drinks    Comment: h/o social drinking  . Drug use: Not Currently    Types: Marijuana  . Sexual activity: Yes    Birth control/protection: None

## 2019-11-11 IMAGING — CR DG CHEST 2V
2 series · 2 of 2 positions shown · non-contrast
Comparison: Chest x-ray 04/22/2018

CLINICAL DATA: Fell.  Left rib pain.

EXAM:
CHEST - 2 VIEW

[chest pa]
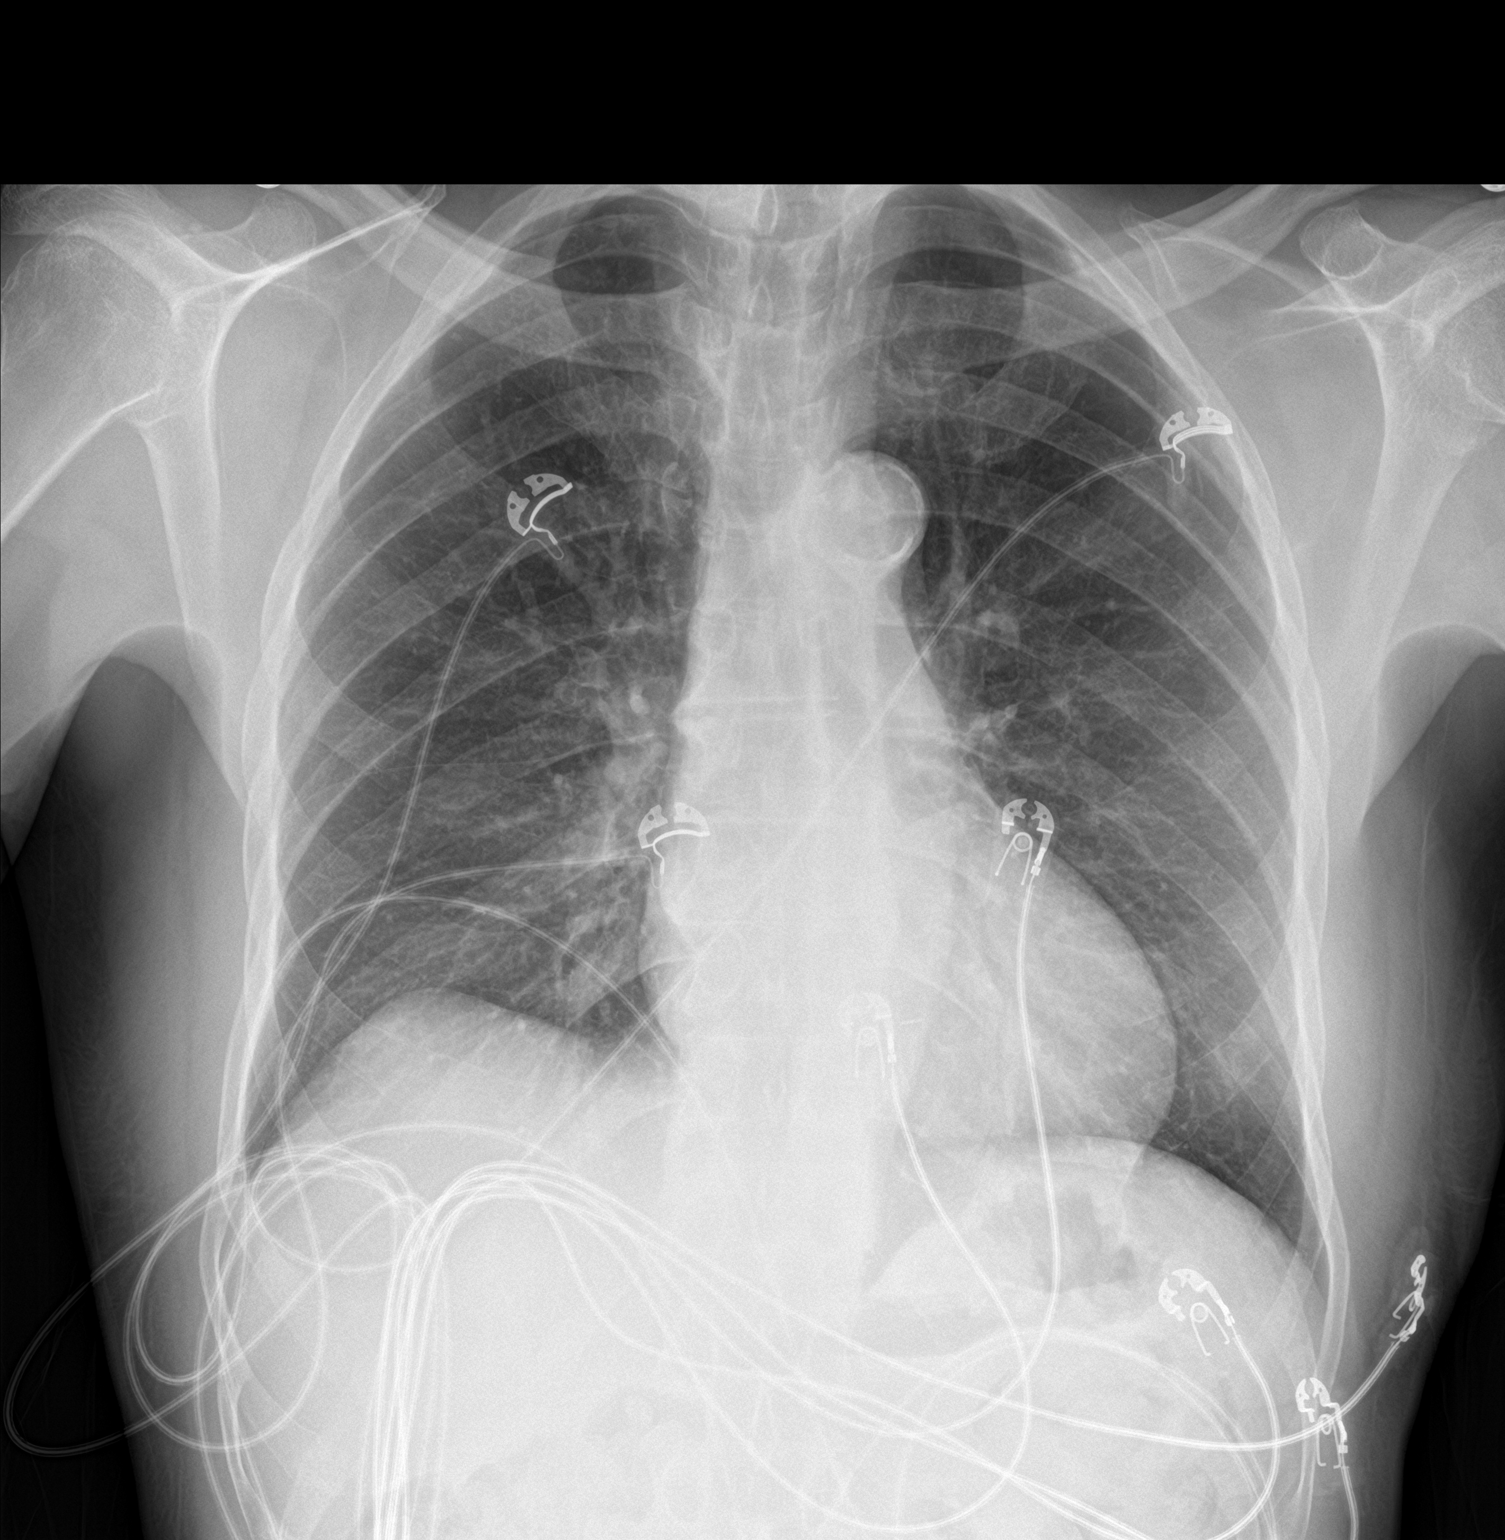

[chest lat]
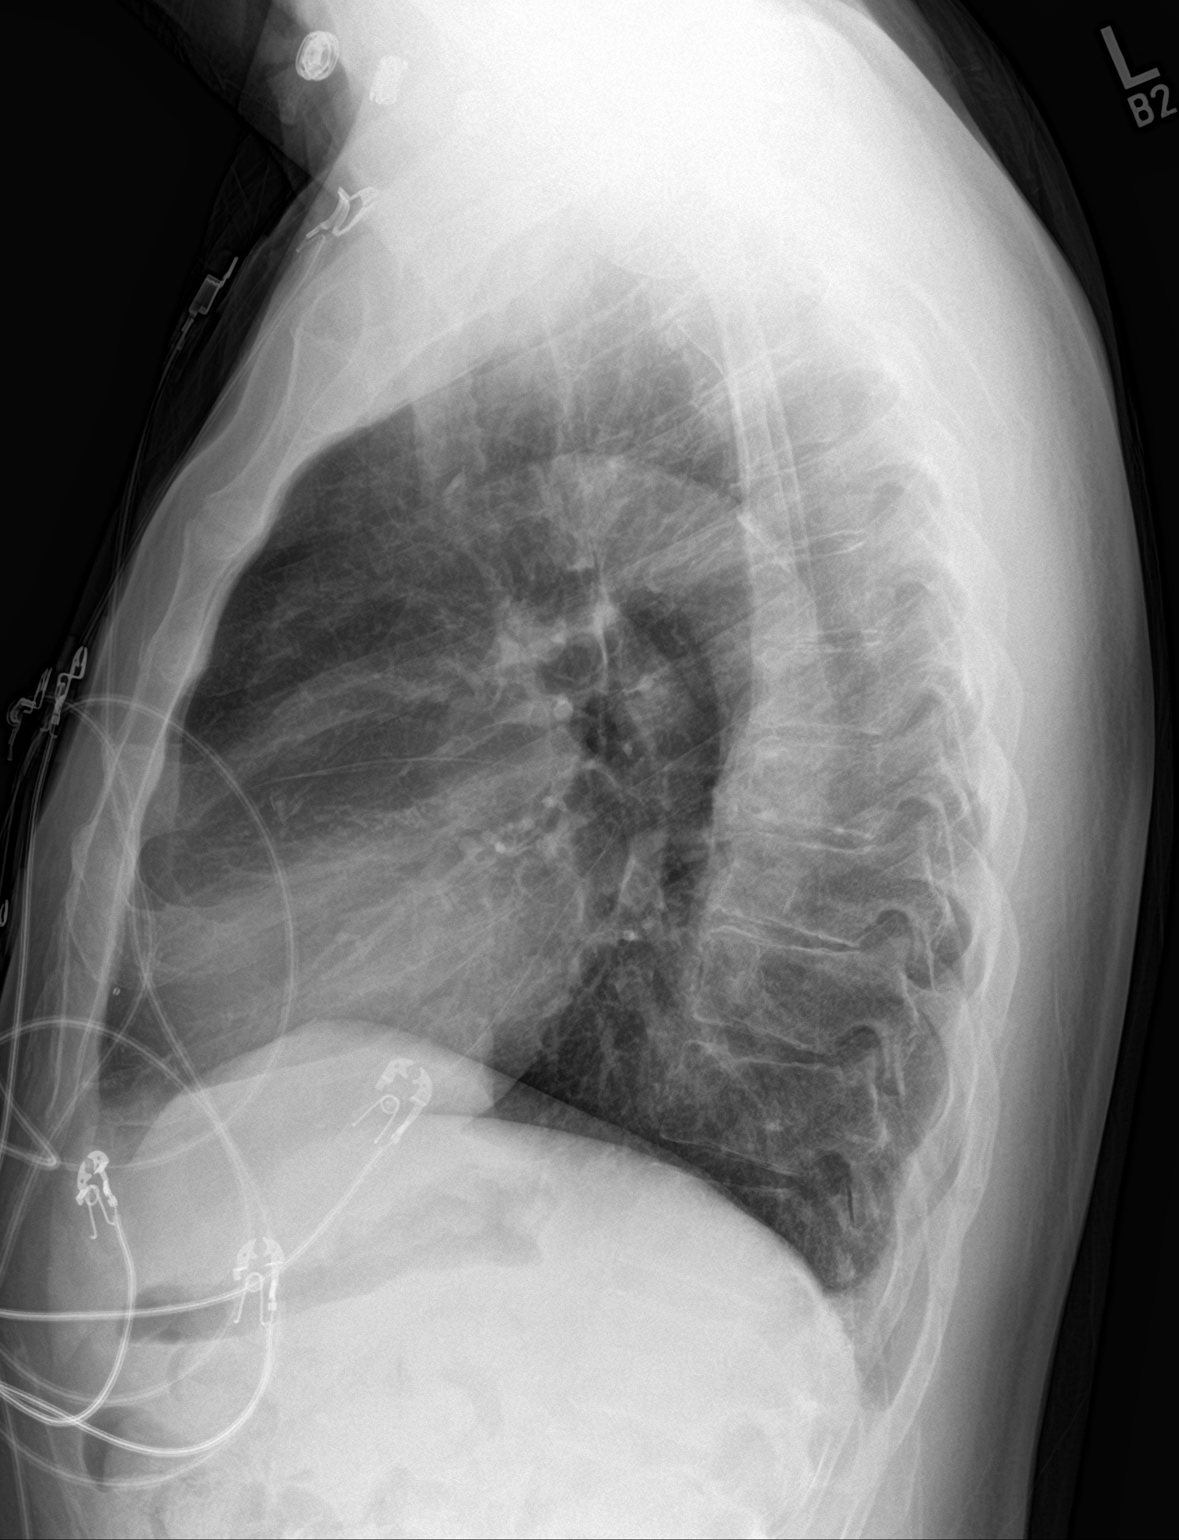

[2 of 2 positions shown; findings below may reference images not displayed]

FINDINGS: The cardiac silhouette, mediastinal and hilar contours are within
normal limits and stable. There is mild tortuosity and calcification
of the thoracic aorta. The lungs are clear of an acute process. No
pulmonary contusion, pleural effusion or pneumothorax.

The bony thorax is intact. No definite rib fractures are identified.
The sternum appears intact and the thoracic vertebral bodies are
normally aligned. No definite compression fractures.
IMPRESSION: 1. No acute cardiopulmonary findings.
2. Intact bony thorax.

## 2019-11-17 ENCOUNTER — Other Ambulatory Visit (HOSPITAL_COMMUNITY)
Admission: RE | Admit: 2019-11-17 | Discharge: 2019-11-17 | Disposition: A | Discharge status: Home / Self Care | Payer: Medicare Other | Source: Ambulatory Visit | Attending: Orthopedic Surgery | Admitting: Orthopedic Surgery

## 2019-11-17 ENCOUNTER — Other Ambulatory Visit: Payer: Self-pay | Admitting: Physician Assistant

## 2019-11-17 DIAGNOSIS — Z01812 Encounter for preprocedural laboratory examination: Secondary | ICD-10-CM | POA: Insufficient documentation

## 2019-11-17 DIAGNOSIS — Z20822 Contact with and (suspected) exposure to covid-19: Secondary | ICD-10-CM | POA: Insufficient documentation

## 2019-11-17 LAB — SARS CORONAVIRUS 2 (TAT 6-24 HRS): SARS Coronavirus 2: NEGATIVE

## 2019-11-18 ENCOUNTER — Other Ambulatory Visit: Payer: Self-pay

## 2019-11-18 ENCOUNTER — Encounter (HOSPITAL_COMMUNITY): Payer: Self-pay | Admitting: Orthopedic Surgery

## 2019-11-18 NOTE — Progress Notes (Signed)
Anesthesia Chart Review:  Follows with cardiology for history of CAD status post CABG 2018 with LIMA to LAD (cath 04/27/2017 revealed patent LIMA to LAD with otherwise noncritical disease and normal LV function), hypertension, carotid disease (Doppler 06/18/2018 showed moderate left ICA stenosis), PVD status post left SFA PTA 12/11/2013 and recent balloon angioplasty of right posterior tibial artery, right anterior tibial artery, stent right popliteal artery on 08/21/2019 by Dr. Donzetta Matters. Last seen by Dr. Gwenlyn Found 06/11/2019, CV status stable at that time.  Recently underwent right fifth ray amputation by Dr. Sharol Given on 07/25/2019.  Seen in the ED for chest pain 10/15/2019.  He reported having intermittent sharp lower chest/upper abdominal pain for the past 2 days.  Initial troponin is 21 which was similar to priors.  Chest x-ray unremarkable.  CMP, CBC, BNP were unremarkable.  Lipase unremarkable.  Delta troponin was 26, similar to his baseline.  Patient was given GI cocktail here with improvement in his symptoms.  After symptom improvement he was discharged for follow-up with PCP and cardiology.  ESRD on HD Monday Wednesday Friday.  DM2 well-controlled last A1c 6.4 on 07/24/2019.  Will need DOS labs and eval.   EKG 10/15/19:   Sinus rhythm. Rate 91. Probable anteroseptal infarct, recent. Abnormal T, consider ischemia, inferior leads. No significant change since 07/24/2019.  TTE 02/23/2018: - Left ventricle: The cavity size was normal. There was severe  concentric hypertrophy. Systolic function was normal. The  estimated ejection fraction was in the range of 60% to 65%. Wall  motion was normal; there were no regional wall motion  abnormalities. There was an increased relative contribution of  atrial contraction to ventricular filling. Doppler parameters are  consistent with abnormal left ventricular relaxation (grade 1  diastolic dysfunction). Doppler parameters are consistent with  high  ventricular filling pressure.  - Aortic valve: Trileaflet; mildly thickened, mildly calcified  leaflets. Valve area (VTI): 1.99 cm^2. Valve area (Vmax): 2.12  cm^2. Valve area (Vmean): 2.02 cm^2.  - Mitral valve: Severely calcified annulus. Moderate diffuse  thickening and calcification of the anterior leaflet and  posterior leaflet. Mobility of the posterior leaflet was  restricted to the point of immobility. The findings are  consistent with moderate stenosis. There was mild regurgitation.  Mean gradient (D): 7 mm Hg. Valve area by continuity equation  (using LVOT flow): 1.11 cm^2.  - Left atrium: The atrium was mildly dilated.  - Pulmonary arteries: Systolic pressure could not be accurately  estimated as IVC is not visualized.   Impressions:   - The right ventricular systolic pressure was increased consistent  with mild pulmonary hypertension.   LHC 04/27/2017:  Prox LAD lesion is 75% stenosed. -Focal lesion that was previously described.  LIMA-LAD is widely patent and is normal in caliber. There is competitive flow.  Otherwise minimal disease throughout. Nothing made greater than 40% in the ostial circumflex.  The left ventricular systolic function is normal. The left ventricular ejection fraction is 50-55% by visual estimate.  LV end diastolic pressure is low. - ~0-3 mmHg  There is no aortic valve stenosis.   Angiographically no culprit lesion to explain the patient's symptoms.  He does have a significant LAD lesion which is a very focal lesion and easily stent pull, however there is a widely patent LIMA graft distally.  There is actually retrograde filling from the LIMA graft to the diagonal branch which would be the only branch jeopardized by the more upstream LAD lesion. Nothing to explain the patient's symptoms.  In  fact, his LVEDP is very low after dialysis.  This would indicate that he was adequately dialyzed and argue against increased LVEDP causing  microvascular ischemic symptoms.   At this point, I think the only choice is to continue with his low-dose beta-blocker and nitrate for what still is probably microvascular disease disease. At least we know that with his ongoing symptoms are not likely related to any flow-limiting microvascular lesions.   Wynonia Musty [facility] Short Stay Center/Anesthesiology Phone [phone] 11/18/2019 4:25 PM

## 2019-11-18 NOTE — Progress Notes (Addendum)
Jon Anderson denies chest pain or shortness of breath. Patient tested negative for Covid_8/9/21_ and has been in quarantine since that time.  Jon Anderson has type II diabetes, he does not take medication for it. Patient reports that CBGs run 70- 100. I instructed patient to check CBG after awaking and every 2 hours until arrival  to the hospital.  I Instructed patient if CBG is less than 70 to drink 1/2 cup of a clear juice. Recheck CBG in 15 minutes then call pre- op desk at [phone] for further instructions.  Jon Anderson is usually hypotensive. Patient takes Midoraine on dialysis days, Wednesday is a dialysis day, he will take it in am.

## 2019-11-18 NOTE — Anesthesia Preprocedure Evaluation (Addendum)
Anesthesia Evaluation  Patient identified by MRN, date of birth, ID band Patient awake    Reviewed: Allergy & Precautions, NPO status , Patient's Chart, lab work & pertinent test results  History of Anesthesia Complications Negative for: history of anesthetic complications  Airway Mallampati: II  TM Distance: >3 FB Neck ROM: Full    Dental no notable dental hx.    Pulmonary former smoker,    Pulmonary exam normal        Cardiovascular hypertension, + CAD (on Plavix), + CABG (2018), + Peripheral Vascular Disease and +CHF  Normal cardiovascular exam  EKG 10/15/19: NSR, probable anteroseptal infarct, no significant change since 07/24/2019  TTE 02/23/2018: severe LVH, EF 56-31%, grade 1diastolic dysfunction, moderate MS, mild MR, mild LAE, mild pulmonary hypertension   Neuro/Psych negative neurological ROS  negative psych ROS   GI/Hepatic Neg liver ROS, GERD  Medicated and Controlled,  Endo/Other  diabetes, Type 2  Renal/GU Dialysis and ESRFRenal disease (HD M/W/F)  negative genitourinary   Musculoskeletal negative musculoskeletal ROS (+) Right foot osteomyelitis    Abdominal   Peds  Hematology  (+) anemia ,   Anesthesia Other Findings Day of surgery medications reviewed with patient.  Reproductive/Obstetrics negative OB ROS                           Anesthesia Physical Anesthesia Plan  ASA: III  Anesthesia Plan: General   Post-op Pain Management: GA combined w/ Regional for post-op pain   Induction: Intravenous  PONV Risk Score and Plan: 2 and Treatment may vary due to age or medical condition, Ondansetron and Dexamethasone  Airway Management Planned: LMA  Additional Equipment: None  Intra-op Plan:   Post-operative Plan: Extubation in OR  Informed Consent: I have reviewed the patients History and Physical, chart, labs and discussed the procedure including the risks, benefits and  alternatives for the proposed anesthesia with the patient or authorized representative who has indicated his/her understanding and acceptance.     Dental advisory given  Plan Discussed with: CRNA  Anesthesia Plan Comments: (PAT note by Karoline Caldwell, PA-C: Follows with cardiology for history of CAD status post CABG 2018 with LIMA to LAD (cath 04/27/2017 revealed patent LIMA to LAD with otherwise noncritical disease and normal LV function), hypertension, carotid disease (Doppler 06/18/2018 showed moderate left ICA stenosis), PVD status post left SFA PTA 12/11/2013 and recent balloon angioplasty of right posterior tibial artery, right anterior tibial artery, stent right popliteal artery on 08/21/2019 by Dr. Donzetta Matters. Last seen by Dr. Gwenlyn Found 06/11/2019, CV status stable at that time.  Recently underwent right fifth ray amputation by Dr. Sharol Given on 07/25/2019.  Seen in the ED for chest pain 10/15/2019.  He reported having intermittent sharp lower chest/upper abdominal pain for the past 2 days.  Initial troponin is 21 which was similar to priors.  Chest x-ray unremarkable.  CMP, CBC, BNP were unremarkable.  Lipase unremarkable.  Delta troponin was 26, similar to his baseline.  Patient was given GI cocktail here with improvement in his symptoms.  After symptom improvement he was discharged for follow-up with PCP and cardiology.  ESRD on HD Monday Wednesday Friday.  DM2 well-controlled last A1c 6.4 on 07/24/2019.  Will need DOS labs and eval.   EKG 10/15/19:   Sinus rhythm. Rate 91. Probable anteroseptal infarct, recent. Abnormal T, consider ischemia, inferior leads. No significant change since 07/24/2019.  TTE 02/23/2018: - Left ventricle: The cavity size was normal. There was severe  concentric hypertrophy. Systolic function was normal. The  estimated ejection fraction was in the range of 60% to 65%. Wall  motion was normal; there were no regional wall motion  abnormalities. There was an increased relative  contribution of  atrial contraction to ventricular filling. Doppler parameters are  consistent with abnormal left ventricular relaxation (grade 1  diastolic dysfunction). Doppler parameters are consistent with  high ventricular filling pressure.  - Aortic valve: Trileaflet; mildly thickened, mildly calcified  leaflets. Valve area (VTI): 1.99 cm^2. Valve area (Vmax): 2.12  cm^2. Valve area (Vmean): 2.02 cm^2.  - Mitral valve: Severely calcified annulus. Moderate diffuse  thickening and calcification of the anterior leaflet and  posterior leaflet. Mobility of the posterior leaflet was  restricted to the point of immobility. The findings are  consistent with moderate stenosis. There was mild regurgitation.  Mean gradient (D): 7 mm Hg. Valve area by continuity equation  (using LVOT flow): 1.11 cm^2.  - Left atrium: The atrium was mildly dilated.  - Pulmonary arteries: Systolic pressure could not be accurately  estimated as IVC is not visualized.   Impressions:   - The right ventricular systolic pressure was increased consistent  with mild pulmonary hypertension.   LHC 04/27/2017: Prox LAD lesion is 75% stenosed. -Focal lesion that was previously described. LIMA-LAD is widely patent and is normal in caliber. There is competitive flow. Otherwise minimal disease throughout. Nothing made greater than 40% in the ostial circumflex. The left ventricular systolic function is normal. The left ventricular ejection fraction is 50-55% by visual estimate. LV end diastolic pressure is low. - ~0-3 mmHg There is no aortic valve stenosis.   Angiographically no culprit lesion to explain the patient's symptoms.  He does have a significant LAD lesion which is a very focal lesion and easily stent pull, however there is a widely patent LIMA graft distally.  There is actually retrograde filling from the LIMA graft to the diagonal branch which would be the only branch jeopardized by  the more upstream LAD lesion. Nothing to explain the patient's symptoms.  In fact, his LVEDP is very low after dialysis.  This would indicate that he was adequately dialyzed and argue against increased LVEDP causing microvascular ischemic symptoms.   At this point, I think the only choice is to continue with his low-dose beta-blocker and nitrate for what still is probably microvascular disease disease. At least we know that with his ongoing symptoms are not likely related to any flow-limiting microvascular lesions. )      Anesthesia Quick Evaluation

## 2019-11-19 ENCOUNTER — Encounter (HOSPITAL_COMMUNITY)
Admission: RE | Disposition: A | Discharge status: Rehabilitation Facility | Payer: Self-pay | Source: Home / Self Care | Attending: Orthopedic Surgery

## 2019-11-19 ENCOUNTER — Encounter (HOSPITAL_COMMUNITY): Payer: Self-pay | Admitting: Orthopedic Surgery

## 2019-11-19 ENCOUNTER — Inpatient Hospital Stay (HOSPITAL_COMMUNITY): Payer: Medicare Other | Admitting: Physician Assistant

## 2019-11-19 ENCOUNTER — Other Ambulatory Visit: Payer: Self-pay

## 2019-11-19 ENCOUNTER — Inpatient Hospital Stay (HOSPITAL_COMMUNITY)
Admission: RE | Admit: 2019-11-19 | Discharge: 2019-11-25 | DRG: 617 | Disposition: A | Discharge status: Rehabilitation Facility | Payer: Medicare Other | Attending: Orthopedic Surgery | Admitting: Orthopedic Surgery

## 2019-11-19 DIAGNOSIS — K219 Gastro-esophageal reflux disease without esophagitis: Secondary | ICD-10-CM | POA: Diagnosis present

## 2019-11-19 DIAGNOSIS — I96 Gangrene, not elsewhere classified: Secondary | ICD-10-CM | POA: Diagnosis present

## 2019-11-19 DIAGNOSIS — Z7982 Long term (current) use of aspirin: Secondary | ICD-10-CM | POA: Diagnosis not present

## 2019-11-19 DIAGNOSIS — E11621 Type 2 diabetes mellitus with foot ulcer: Secondary | ICD-10-CM

## 2019-11-19 DIAGNOSIS — Z955 Presence of coronary angioplasty implant and graft: Secondary | ICD-10-CM

## 2019-11-19 DIAGNOSIS — E8889 Other specified metabolic disorders: Secondary | ICD-10-CM | POA: Diagnosis present

## 2019-11-19 DIAGNOSIS — Z888 Allergy status to other drugs, medicaments and biological substances status: Secondary | ICD-10-CM | POA: Diagnosis not present

## 2019-11-19 DIAGNOSIS — E119 Type 2 diabetes mellitus without complications: Secondary | ICD-10-CM | POA: Diagnosis not present

## 2019-11-19 DIAGNOSIS — Z87442 Personal history of urinary calculi: Secondary | ICD-10-CM | POA: Diagnosis not present

## 2019-11-19 DIAGNOSIS — Z7902 Long term (current) use of antithrombotics/antiplatelets: Secondary | ICD-10-CM | POA: Diagnosis not present

## 2019-11-19 DIAGNOSIS — I959 Hypotension, unspecified: Secondary | ICD-10-CM | POA: Diagnosis present

## 2019-11-19 DIAGNOSIS — Z951 Presence of aortocoronary bypass graft: Secondary | ICD-10-CM

## 2019-11-19 DIAGNOSIS — Z89431 Acquired absence of right foot: Secondary | ICD-10-CM | POA: Diagnosis not present

## 2019-11-19 DIAGNOSIS — E1152 Type 2 diabetes mellitus with diabetic peripheral angiopathy with gangrene: Secondary | ICD-10-CM | POA: Diagnosis present

## 2019-11-19 DIAGNOSIS — I132 Hypertensive heart and chronic kidney disease with heart failure and with stage 5 chronic kidney disease, or end stage renal disease: Secondary | ICD-10-CM | POA: Diagnosis present

## 2019-11-19 DIAGNOSIS — R34 Anuria and oliguria: Secondary | ICD-10-CM | POA: Diagnosis present

## 2019-11-19 DIAGNOSIS — Z91048 Other nonmedicinal substance allergy status: Secondary | ICD-10-CM | POA: Diagnosis not present

## 2019-11-19 DIAGNOSIS — Z20822 Contact with and (suspected) exposure to covid-19: Secondary | ICD-10-CM | POA: Diagnosis present

## 2019-11-19 DIAGNOSIS — Z8249 Family history of ischemic heart disease and other diseases of the circulatory system: Secondary | ICD-10-CM | POA: Diagnosis not present

## 2019-11-19 DIAGNOSIS — K5909 Other constipation: Secondary | ICD-10-CM | POA: Diagnosis present

## 2019-11-19 DIAGNOSIS — E1151 Type 2 diabetes mellitus with diabetic peripheral angiopathy without gangrene: Secondary | ICD-10-CM | POA: Diagnosis present

## 2019-11-19 DIAGNOSIS — E1169 Type 2 diabetes mellitus with other specified complication: Secondary | ICD-10-CM | POA: Diagnosis present

## 2019-11-19 DIAGNOSIS — I509 Heart failure, unspecified: Secondary | ICD-10-CM | POA: Diagnosis present

## 2019-11-19 DIAGNOSIS — D631 Anemia in chronic kidney disease: Secondary | ICD-10-CM | POA: Diagnosis present

## 2019-11-19 DIAGNOSIS — Z992 Dependence on renal dialysis: Secondary | ICD-10-CM

## 2019-11-19 DIAGNOSIS — I2510 Atherosclerotic heart disease of native coronary artery without angina pectoris: Secondary | ICD-10-CM | POA: Diagnosis present

## 2019-11-19 DIAGNOSIS — E785 Hyperlipidemia, unspecified: Secondary | ICD-10-CM | POA: Diagnosis present

## 2019-11-19 DIAGNOSIS — I272 Pulmonary hypertension, unspecified: Secondary | ICD-10-CM | POA: Diagnosis present

## 2019-11-19 DIAGNOSIS — Z79899 Other long term (current) drug therapy: Secondary | ICD-10-CM

## 2019-11-19 DIAGNOSIS — I120 Hypertensive chronic kidney disease with stage 5 chronic kidney disease or end stage renal disease: Secondary | ICD-10-CM | POA: Diagnosis present

## 2019-11-19 DIAGNOSIS — N2581 Secondary hyperparathyroidism of renal origin: Secondary | ICD-10-CM | POA: Diagnosis present

## 2019-11-19 DIAGNOSIS — E1122 Type 2 diabetes mellitus with diabetic chronic kidney disease: Secondary | ICD-10-CM | POA: Diagnosis present

## 2019-11-19 DIAGNOSIS — G2581 Restless legs syndrome: Secondary | ICD-10-CM | POA: Diagnosis present

## 2019-11-19 DIAGNOSIS — Z89421 Acquired absence of other right toe(s): Secondary | ICD-10-CM

## 2019-11-19 DIAGNOSIS — M86271 Subacute osteomyelitis, right ankle and foot: Secondary | ICD-10-CM | POA: Diagnosis present

## 2019-11-19 DIAGNOSIS — Z833 Family history of diabetes mellitus: Secondary | ICD-10-CM | POA: Diagnosis not present

## 2019-11-19 DIAGNOSIS — Z87891 Personal history of nicotine dependence: Secondary | ICD-10-CM | POA: Diagnosis not present

## 2019-11-19 DIAGNOSIS — N186 End stage renal disease: Secondary | ICD-10-CM | POA: Diagnosis present

## 2019-11-19 DIAGNOSIS — Z4781 Encounter for orthopedic aftercare following surgical amputation: Secondary | ICD-10-CM | POA: Diagnosis present

## 2019-11-19 DIAGNOSIS — I739 Peripheral vascular disease, unspecified: Secondary | ICD-10-CM | POA: Diagnosis not present

## 2019-11-19 DIAGNOSIS — I953 Hypotension of hemodialysis: Secondary | ICD-10-CM | POA: Diagnosis not present

## 2019-11-19 DIAGNOSIS — Z91018 Allergy to other foods: Secondary | ICD-10-CM | POA: Diagnosis not present

## 2019-11-19 DIAGNOSIS — Z9861 Coronary angioplasty status: Secondary | ICD-10-CM | POA: Diagnosis not present

## 2019-11-19 HISTORY — DX: Constipation, unspecified: K59.00

## 2019-11-19 HISTORY — DX: Dyspnea, unspecified: R06.00

## 2019-11-19 HISTORY — PX: AMPUTATION: SHX166

## 2019-11-19 HISTORY — DX: Personal history of urinary calculi: Z87.442

## 2019-11-19 LAB — POCT I-STAT, CHEM 8
BUN: 42 mg/dL — ABNORMAL HIGH (ref 8–23)
Calcium, Ion: 1.08 mmol/L — ABNORMAL LOW (ref 1.15–1.40)
Chloride: 93 mmol/L — ABNORMAL LOW (ref 98–111)
Creatinine, Ser: 10.6 mg/dL — ABNORMAL HIGH (ref 0.61–1.24)
Glucose, Bld: 94 mg/dL (ref 70–99)
HCT: 43 % (ref 39.0–52.0)
Hemoglobin: 14.6 g/dL (ref 13.0–17.0)
Potassium: 4 mmol/L (ref 3.5–5.1)
Sodium: 135 mmol/L (ref 135–145)
TCO2: 28 mmol/L (ref 22–32)

## 2019-11-19 LAB — GLUCOSE, CAPILLARY
Glucose-Capillary: 98 mg/dL (ref 70–99)
Glucose-Capillary: 123 mg/dL — ABNORMAL HIGH (ref 70–99)
Glucose-Capillary: 133 mg/dL — ABNORMAL HIGH (ref 70–99)

## 2019-11-19 SURGERY — AMPUTATION, FOOT, PARTIAL
Anesthesia: General | Site: Foot | Laterality: Right

## 2019-11-19 MED ORDER — ACETAMINOPHEN 500 MG PO TABS
1000.0000 mg | ORAL_TABLET | Freq: Once | ORAL | Status: AC
  Administered 2019-11-19: 1000 mg via ORAL
  Filled 2019-11-19: qty 2

## 2019-11-19 MED ORDER — LACTATED RINGERS IV SOLN: INTRAVENOUS | Status: DC

## 2019-11-19 MED ORDER — ORAL CARE MOUTH RINSE: 15.0000 mL | Freq: Once | OROMUCOSAL | Status: AC

## 2019-11-19 MED ORDER — SODIUM CHLORIDE 0.9 % IV SOLN: INTRAVENOUS | Status: DC

## 2019-11-19 MED ORDER — ONDANSETRON HCL 4 MG/2ML IJ SOLN
4.0000 mg | Freq: Four times a day (QID) | INTRAMUSCULAR | Status: DC | PRN
  Administered 2019-11-23: 4 mg via INTRAVENOUS
  Filled 2019-11-19: qty 2

## 2019-11-19 MED ORDER — FENTANYL CITRATE (PF) 100 MCG/2ML IJ SOLN
INTRAMUSCULAR | Status: AC
  Administered 2019-11-19: 50 ug via INTRAVENOUS
  Filled 2019-11-19: qty 2

## 2019-11-19 MED ORDER — FENTANYL CITRATE (PF) 250 MCG/5ML IJ SOLN
INTRAMUSCULAR | Status: AC
  Filled 2019-11-19: qty 5

## 2019-11-19 MED ORDER — CEFAZOLIN SODIUM-DEXTROSE 1-4 GM/50ML-% IV SOLN: 1.0000 g | Freq: Four times a day (QID) | INTRAVENOUS | Status: DC

## 2019-11-19 MED ORDER — ONDANSETRON HCL 4 MG/2ML IJ SOLN
INTRAMUSCULAR | Status: AC
  Filled 2019-11-19: qty 2

## 2019-11-19 MED ORDER — MIDAZOLAM HCL 2 MG/2ML IJ SOLN: 1.0000 mg | Freq: Once | INTRAMUSCULAR | Status: AC

## 2019-11-19 MED ORDER — ACETAMINOPHEN 325 MG PO TABS: 325.0000 mg | ORAL_TABLET | Freq: Four times a day (QID) | ORAL | Status: DC | PRN

## 2019-11-19 MED ORDER — PROPOFOL 10 MG/ML IV BOLUS
INTRAVENOUS | Status: DC | PRN
  Administered 2019-11-19: 50 mg via INTRAVENOUS
  Administered 2019-11-19: 150 mg via INTRAVENOUS

## 2019-11-19 MED ORDER — 0.9 % SODIUM CHLORIDE (POUR BTL) OPTIME
TOPICAL | Status: DC | PRN
  Administered 2019-11-19: 1000 mL

## 2019-11-19 MED ORDER — MIDAZOLAM HCL 2 MG/2ML IJ SOLN
INTRAMUSCULAR | Status: AC
  Administered 2019-11-19: 1 mg via INTRAVENOUS
  Filled 2019-11-19: qty 2

## 2019-11-19 MED ORDER — HYDROMORPHONE HCL 1 MG/ML IJ SOLN
0.5000 mg | INTRAMUSCULAR | Status: DC | PRN
  Administered 2019-11-21 (×2): 0.5 mg via INTRAVENOUS
  Filled 2019-11-19: qty 1

## 2019-11-19 MED ORDER — CLONIDINE HCL (ANALGESIA) 100 MCG/ML EP SOLN
EPIDURAL | Status: DC | PRN
  Administered 2019-11-19: 33 ug
  Administered 2019-11-19: 67 ug

## 2019-11-19 MED ORDER — CEFAZOLIN SODIUM-DEXTROSE 2-4 GM/100ML-% IV SOLN
2.0000 g | INTRAVENOUS | Status: AC
  Administered 2019-11-19: 2 g via INTRAVENOUS
  Filled 2019-11-19: qty 100

## 2019-11-19 MED ORDER — FENTANYL CITRATE (PF) 100 MCG/2ML IJ SOLN: 25.0000 ug | INTRAMUSCULAR | Status: DC | PRN

## 2019-11-19 MED ORDER — GABAPENTIN 100 MG PO CAPS
100.0000 mg | ORAL_CAPSULE | Freq: Two times a day (BID) | ORAL | Status: DC
  Administered 2019-11-19 – 2019-11-25 (×11): 100 mg via ORAL
  Filled 2019-11-19 (×11): qty 1

## 2019-11-19 MED ORDER — FENTANYL CITRATE (PF) 100 MCG/2ML IJ SOLN: 50.0000 ug | Freq: Once | INTRAMUSCULAR | Status: AC

## 2019-11-19 MED ORDER — MIDODRINE HCL 5 MG PO TABS
5.0000 mg | ORAL_TABLET | ORAL | Status: DC
  Administered 2019-11-21 – 2019-11-24 (×3): 5 mg via ORAL
  Filled 2019-11-19 (×3): qty 1

## 2019-11-19 MED ORDER — PANCRELIPASE (LIP-PROT-AMYL) 36000-114000 UNITS PO CPEP
36000.0000 [IU] | ORAL_CAPSULE | Freq: Every day | ORAL | Status: DC
  Administered 2019-11-19 – 2019-11-25 (×6): 36000 [IU] via ORAL
  Filled 2019-11-19 (×6): qty 1

## 2019-11-19 MED ORDER — CHLORHEXIDINE GLUCONATE CLOTH 2 % EX PADS: 6.0000 | MEDICATED_PAD | Freq: Every day | CUTANEOUS | Status: DC

## 2019-11-19 MED ORDER — LIDOCAINE 2% (20 MG/ML) 5 ML SYRINGE
INTRAMUSCULAR | Status: AC
  Filled 2019-11-19: qty 5

## 2019-11-19 MED ORDER — CLOPIDOGREL BISULFATE 75 MG PO TABS
75.0000 mg | ORAL_TABLET | Freq: Every day | ORAL | Status: DC
  Administered 2019-11-20 – 2019-11-25 (×5): 75 mg via ORAL
  Filled 2019-11-19 (×5): qty 1

## 2019-11-19 MED ORDER — CHLORHEXIDINE GLUCONATE 0.12 % MT SOLN
OROMUCOSAL | Status: AC
  Administered 2019-11-19: 15 mL via OROMUCOSAL
  Filled 2019-11-19: qty 15

## 2019-11-19 MED ORDER — CALCIUM ACETATE (PHOS BINDER) 667 MG PO CAPS
1334.0000 mg | ORAL_CAPSULE | Freq: Two times a day (BID) | ORAL | Status: DC
  Administered 2019-11-19 – 2019-11-25 (×9): 1334 mg via ORAL
  Filled 2019-11-19 (×13): qty 2

## 2019-11-19 MED ORDER — NITROGLYCERIN 0.4 MG SL SUBL: 0.4000 mg | SUBLINGUAL_TABLET | SUBLINGUAL | Status: DC | PRN

## 2019-11-19 MED ORDER — PHENYLEPHRINE 40 MCG/ML (10ML) SYRINGE FOR IV PUSH (FOR BLOOD PRESSURE SUPPORT)
PREFILLED_SYRINGE | INTRAVENOUS | Status: DC | PRN
  Administered 2019-11-19: 120 ug via INTRAVENOUS
  Administered 2019-11-19 (×2): 80 ug via INTRAVENOUS

## 2019-11-19 MED ORDER — LIDOCAINE 2% (20 MG/ML) 5 ML SYRINGE
INTRAMUSCULAR | Status: DC | PRN
  Administered 2019-11-19: 100 mg via INTRAVENOUS

## 2019-11-19 MED ORDER — RENA-VITE PO TABS
1.0000 | ORAL_TABLET | Freq: Every day | ORAL | Status: DC
  Administered 2019-11-19 – 2019-11-24 (×6): 1 via ORAL
  Filled 2019-11-19 (×6): qty 1

## 2019-11-19 MED ORDER — PROPOFOL 10 MG/ML IV BOLUS
INTRAVENOUS | Status: AC
  Filled 2019-11-19: qty 20

## 2019-11-19 MED ORDER — DEXAMETHASONE SODIUM PHOSPHATE 10 MG/ML IJ SOLN
INTRAMUSCULAR | Status: AC
  Filled 2019-11-19: qty 1

## 2019-11-19 MED ORDER — ONDANSETRON HCL 4 MG/2ML IJ SOLN
INTRAMUSCULAR | Status: DC | PRN
  Administered 2019-11-19: 4 mg via INTRAVENOUS

## 2019-11-19 MED ORDER — BUPIVACAINE-EPINEPHRINE (PF) 0.5% -1:200000 IJ SOLN
INTRAMUSCULAR | Status: DC | PRN
  Administered 2019-11-19: 20 mL via PERINEURAL
  Administered 2019-11-19: 10 mL via PERINEURAL

## 2019-11-19 MED ORDER — DOCUSATE SODIUM 100 MG PO CAPS
100.0000 mg | ORAL_CAPSULE | Freq: Two times a day (BID) | ORAL | Status: DC
  Administered 2019-11-19 – 2019-11-25 (×9): 100 mg via ORAL
  Filled 2019-11-19 (×11): qty 1

## 2019-11-19 MED ORDER — ASPIRIN EC 81 MG PO TBEC
81.0000 mg | DELAYED_RELEASE_TABLET | Freq: Every day | ORAL | Status: DC
  Administered 2019-11-19 – 2019-11-25 (×6): 81 mg via ORAL
  Filled 2019-11-19 (×6): qty 1

## 2019-11-19 MED ORDER — OXYCODONE HCL 5 MG PO TABS: 5.0000 mg | ORAL_TABLET | Freq: Once | ORAL | Status: DC | PRN

## 2019-11-19 MED ORDER — ONDANSETRON HCL 4 MG PO TABS: 4.0000 mg | ORAL_TABLET | Freq: Four times a day (QID) | ORAL | Status: DC | PRN

## 2019-11-19 MED ORDER — CHLORHEXIDINE GLUCONATE 0.12 % MT SOLN: 15.0000 mL | Freq: Once | OROMUCOSAL | Status: AC

## 2019-11-19 MED ORDER — PROMETHAZINE HCL 25 MG/ML IJ SOLN: 6.2500 mg | INTRAMUSCULAR | Status: DC | PRN

## 2019-11-19 MED ORDER — ATORVASTATIN CALCIUM 40 MG PO TABS
40.0000 mg | ORAL_TABLET | Freq: Every day | ORAL | Status: DC
  Administered 2019-11-19 – 2019-11-24 (×6): 40 mg via ORAL
  Filled 2019-11-19 (×6): qty 1

## 2019-11-19 MED ORDER — DEXAMETHASONE SODIUM PHOSPHATE 10 MG/ML IJ SOLN
INTRAMUSCULAR | Status: DC | PRN
  Administered 2019-11-19: 4 mg via INTRAVENOUS

## 2019-11-19 MED ORDER — CEFAZOLIN SODIUM-DEXTROSE 1-4 GM/50ML-% IV SOLN
1.0000 g | INTRAVENOUS | Status: AC
  Administered 2019-11-20: 1 g via INTRAVENOUS
  Filled 2019-11-19 (×2): qty 50

## 2019-11-19 MED ORDER — BUPIVACAINE-EPINEPHRINE (PF) 0.5% -1:200000 IJ SOLN
INTRAMUSCULAR | Status: DC | PRN
Start: 2019-11-19 — End: 2019-11-19

## 2019-11-19 MED ORDER — OXYCODONE HCL 5 MG/5ML PO SOLN: 5.0000 mg | Freq: Once | ORAL | Status: DC | PRN

## 2019-11-19 MED ORDER — OXYCODONE HCL 5 MG PO TABS
5.0000 mg | ORAL_TABLET | ORAL | Status: DC | PRN
  Administered 2019-11-19: 10 mg via ORAL
  Administered 2019-11-20: 5 mg via ORAL
  Administered 2019-11-20: 10 mg via ORAL
  Administered 2019-11-21: 5 mg via ORAL
  Administered 2019-11-21: 10 mg via ORAL
  Administered 2019-11-21: 5 mg via ORAL
  Administered 2019-11-22: 10 mg via ORAL
  Administered 2019-11-22: 5 mg via ORAL
  Administered 2019-11-22 – 2019-11-25 (×7): 10 mg via ORAL
  Filled 2019-11-19 (×3): qty 2
  Filled 2019-11-19 (×3): qty 1
  Filled 2019-11-19: qty 2
  Filled 2019-11-19 (×2): qty 1
  Filled 2019-11-19 (×4): qty 2
  Filled 2019-11-19: qty 1
  Filled 2019-11-19 (×2): qty 2

## 2019-11-19 MED ORDER — PANTOPRAZOLE SODIUM 40 MG PO TBEC
40.0000 mg | DELAYED_RELEASE_TABLET | Freq: Every day | ORAL | Status: DC
  Administered 2019-11-20 – 2019-11-25 (×5): 40 mg via ORAL
  Filled 2019-11-19 (×6): qty 1

## 2019-11-19 MED ORDER — PHENYLEPHRINE 40 MCG/ML (10ML) SYRINGE FOR IV PUSH (FOR BLOOD PRESSURE SUPPORT)
PREFILLED_SYRINGE | INTRAVENOUS | Status: AC
  Filled 2019-11-19: qty 10

## 2019-11-19 SURGICAL SUPPLY — 35 items
APL SKNCLS STERI-STRIP NONHPOA (GAUZE/BANDAGES/DRESSINGS) ×1
BENZOIN TINCTURE PRP APPL 2/3 (GAUZE/BANDAGES/DRESSINGS) ×2 IMPLANT
BLADE SAW SGTL HD 18.5X60.5X1. (BLADE) ×3 IMPLANT
BLADE SURG 21 STRL SS (BLADE) ×2 IMPLANT
BNDG COHESIVE 4X5 TAN STRL (GAUZE/BANDAGES/DRESSINGS) ×1 IMPLANT
BNDG GAUZE ELAST 4 BULKY (GAUZE/BANDAGES/DRESSINGS) IMPLANT
CANISTER PREVENA PLUS 150 (CANNISTER) ×2 IMPLANT
COVER SURGICAL LIGHT HANDLE (MISCELLANEOUS) ×2 IMPLANT
COVER WAND RF STERILE (DRAPES) ×2 IMPLANT
DRAPE INCISE IOBAN 66X45 STRL (DRAPES) ×2 IMPLANT
DRAPE U-SHAPE 47X51 STRL (DRAPES) ×2 IMPLANT
DRSG ADAPTIC 3X8 NADH LF (GAUZE/BANDAGES/DRESSINGS) IMPLANT
DRSG PAD ABDOMINAL 8X10 ST (GAUZE/BANDAGES/DRESSINGS) IMPLANT
DURAPREP 26ML APPLICATOR (WOUND CARE) ×2 IMPLANT
ELECT REM PT RETURN 9FT ADLT (ELECTROSURGICAL) ×2
ELECTRODE REM PT RTRN 9FT ADLT (ELECTROSURGICAL) ×1 IMPLANT
GAUZE SPONGE 4X4 12PLY STRL (GAUZE/BANDAGES/DRESSINGS) IMPLANT
GLOVE BIOGEL PI IND STRL 9 (GLOVE) ×1 IMPLANT
GLOVE BIOGEL PI INDICATOR 9 (GLOVE) ×1
GLOVE SURG ORTHO 9.0 STRL STRW (GLOVE) ×2 IMPLANT
GOWN STRL REUS W/ TWL XL LVL3 (GOWN DISPOSABLE) ×3 IMPLANT
GOWN STRL REUS W/TWL XL LVL3 (GOWN DISPOSABLE) ×6
KIT BASIN OR (CUSTOM PROCEDURE TRAY) ×2 IMPLANT
KIT PREVENA INCISION MGT 13 (CANNISTER) ×2 IMPLANT
KIT TURNOVER KIT B (KITS) ×2 IMPLANT
NS IRRIG 1000ML POUR BTL (IV SOLUTION) ×2 IMPLANT
PACK ORTHO EXTREMITY (CUSTOM PROCEDURE TRAY) ×2 IMPLANT
PAD ARMBOARD 7.5X6 YLW CONV (MISCELLANEOUS) ×4 IMPLANT
SPONGE LAP 18X18 RF (DISPOSABLE) IMPLANT
SUT ETHILON 2 0 PSLX (SUTURE) ×4 IMPLANT
TOWEL GREEN STERILE (TOWEL DISPOSABLE) ×2 IMPLANT
TOWEL GREEN STERILE FF (TOWEL DISPOSABLE) ×2 IMPLANT
TUBE CONNECTING 12X1/4 (SUCTIONS) ×2 IMPLANT
WATER STERILE IRR 1000ML POUR (IV SOLUTION) ×2 IMPLANT
YANKAUER SUCT BULB TIP NO VENT (SUCTIONS) ×2 IMPLANT

## 2019-11-19 NOTE — Op Note (Signed)
11/19/2019  11:40 AM  PATIENT:  Jon Anderson    PRE-OPERATIVE DIAGNOSIS:  Osteomyelitis Right Foot  POST-OPERATIVE DIAGNOSIS:  Same  PROCEDURE:  RIGHT TRANSMETATARSAL AMPUTATION  SURGEON:  Newt Minion, MD  PHYSICIAN ASSISTANT:None ANESTHESIA:   General  PREOPERATIVE INDICATIONS:  Jon Anderson is a  69 y.o. male with a diagnosis of Osteomyelitis Right Foot who failed conservative measures and elected for surgical management.    The risks benefits and alternatives were discussed with the patient preoperatively including but not limited to the risks of infection, bleeding, nerve injury, cardiopulmonary complications, the need for revision surgery, among others, and the patient was willing to proceed.  OPERATIVE IMPLANTS: Prevena incisional wound VAC 13 cm   @ENCIMAGES @  OPERATIVE FINDINGS: Petechial bleeding at the wound edges  OPERATIVE PROCEDURE: Patient was brought to the operating room and underwent a general anesthetic. After adequate levels anesthesia were obtained patient's right lower extremity was prepped using DuraPrep draped into a sterile field a timeout was called. A fishmouth incision was made through the metatarsal heads this was carried sharply down to bone. An oscillating saw was used to perform a transmetatarsal amputation. A knife was used to complete the amputation. Electrocautery was used for hemostasis the wound was irrigated with normal saline and the incision was closed using 2-0 nylon a Prevena wound VAC was applied this had a good suction fit patient was extubated taken the PACU in stable condition.Marland Kitchen   DISCHARGE PLANNING:  Antibiotic duration: 24-hour IV antibiotics  Weightbearing: Touchdown weightbearing on the right  Pain medication: Opioid pathway  Dressing care/ Wound VAC: Continue wound VAC for 1 week  Ambulatory devices:walker  Discharge to: Anticipate discharge to inpatient versus outpatient rehab.  Follow-up: In the office 1 week  post operative.

## 2019-11-19 NOTE — Transfer of Care (Signed)
Immediate Anesthesia Transfer of Care Note  Patient: Jon Anderson  Procedure(s) Performed: RIGHT TRANSMETATARSAL AMPUTATION (Right Foot)  Patient Location: PACU  Anesthesia Type:General and Regional  Level of Consciousness: drowsy and patient cooperative  Airway & Oxygen Therapy: Patient Spontanous Breathing and Patient connected to nasal cannula oxygen  Post-op Assessment: Report given to RN  Post vital signs: Reviewed and stable  Last Vitals:  Vitals Value Taken Time  BP 114/68 11/19/19 1144  Temp    Pulse 70 11/19/19 1144  Resp    SpO2 100 % 11/19/19 1144  Vitals shown include unvalidated device data.  Last Pain:  Vitals:   11/19/19 0828  TempSrc:   PainSc: 0-No pain         Complications: No complications documented.

## 2019-11-19 NOTE — Progress Notes (Signed)
Sharyn Lull, RN called made aware that the pt's HD tx has been moved to 11/20/19.

## 2019-11-19 NOTE — Anesthesia Procedure Notes (Signed)
Anesthesia Regional Block: Adductor canal block   Pre-Anesthetic Checklist: ,, timeout performed, Correct Patient, Correct Site, Correct Laterality, Correct Procedure, Correct Position, site marked, Risks and benefits discussed, pre-op evaluation,  At surgeon's request and post-op pain management  Laterality: Right  Prep: Maximum Sterile Barrier Precautions used, chloraprep       Needles:  Injection technique: Single-shot  Needle Type: Echogenic Stimulator Needle     Needle Length: 9cm  Needle Gauge: 22     Additional Needles:   Procedures:,,,, ultrasound used (permanent image in chart),,,,  Narrative:  Start time: 11/19/2019 9:23 AM End time: 11/19/2019 9:25 AM Injection made incrementally with aspirations every 5 mL.  Performed by: Personally  Anesthesiologist: Brennan Bailey, MD  Additional Notes: Risks, benefits, and alternative discussed. Patient gave consent for procedure. Patient prepped and draped in sterile fashion. Sedation administered, patient remains easily responsive to voice. Relevant anatomy identified with ultrasound guidance. Local anesthetic given in 5cc increments with no signs or symptoms of intravascular injection. No pain or paraesthesias with injection. Patient monitored throughout procedure with signs of LAST or immediate complications. Tolerated well. Ultrasound image placed in chart.  Tawny Asal, MD

## 2019-11-19 NOTE — Anesthesia Postprocedure Evaluation (Signed)
Anesthesia Post Note  Patient: Jon Anderson  Procedure(s) Performed: RIGHT TRANSMETATARSAL AMPUTATION (Right Foot)     Patient location during evaluation: PACU Anesthesia Type: General Level of consciousness: awake and alert and oriented Pain management: pain level controlled Vital Signs Assessment: post-procedure vital signs reviewed and stable Respiratory status: spontaneous breathing, nonlabored ventilation and respiratory function stable Cardiovascular status: blood pressure returned to baseline Postop Assessment: no apparent nausea or vomiting Anesthetic complications: no   No complications documented.  Last Vitals:  Vitals:   11/19/19 0930 11/19/19 0935  BP:  106/62  Pulse:  68  Resp: 18 12  Temp:    SpO2:  99%    Last Pain:  Vitals:   11/19/19 1145  TempSrc:   PainSc: 0-No pain                 Brennan Bailey

## 2019-11-19 NOTE — Anesthesia Procedure Notes (Signed)
Anesthesia Regional Block: Popliteal block   Pre-Anesthetic Checklist: ,, timeout performed, Correct Patient, Correct Site, Correct Laterality, Correct Procedure, Correct Position, site marked, Risks and benefits discussed, pre-op evaluation,  At surgeon's request and post-op pain management  Laterality: Right  Prep: Maximum Sterile Barrier Precautions used, chloraprep       Needles:  Injection technique: Single-shot  Needle Type: Echogenic Stimulator Needle     Needle Length: 9cm  Needle Gauge: 22     Additional Needles:   Procedures:,,,, ultrasound used (permanent image in chart),,,,  Narrative:  Start time: 11/19/2019 9:26 AM End time: 11/19/2019 9:28 AM Injection made incrementally with aspirations every 5 mL.  Performed by: Personally  Anesthesiologist: Brennan Bailey, MD  Additional Notes: Risks, benefits, and alternative discussed. Patient gave consent for procedure. Patient prepped and draped in sterile fashion. Sedation administered, patient remains easily responsive to voice. Relevant anatomy identified with ultrasound guidance. Local anesthetic given in 5cc increments with no signs or symptoms of intravascular injection. No pain or paraesthesias with injection. Patient monitored throughout procedure with signs of LAST or immediate complications. Tolerated well. Ultrasound image placed in chart.  Tawny Asal, MD

## 2019-11-19 NOTE — Consult Note (Signed)
[facility] Renal Consultation Note    Indication for Consultation:  Management of ESRD/hemodialysis, anemia, hypertension/volume, and secondary hyperparathyroidism. PCP:  HPI: Jon Anderson is a 69 y.o. male with ESRD, CAD, T2DM, HTN, and PAD who was admitted for R TMA.   The patient reports he has been dealing with RLE toe wounds for months now. S/p 5th ray amputation roughly 3 months ago. Had persistent necrotic changes to great and 4th toe with osteomyelitis of great toe therefore he elected to proceed with transmetatarsal amputation which was completed this morning.  At this time, his pain is controlled. No CP, dyspnea, N/V, diarrhea, fever, or chills.  Dialyzes on MWF schedule at [facility]. Last HD was 8/9 which he completed in entirety. He uses LUE AVF which is aneurysmal with some thinning areas - discussed that would benefit from plication consideration after leg heals.   Past Medical History:  Diagnosis Date  . Anemia of chronic disease   . CAD (coronary artery disease)    a.  Myoview 4/11: EF 53%, no scar or ischemia   c. MV 2012 Nl perfusion, apical thinning.  No ischemia or scar.  EF 49%, appears greater by visual estimate.;  d.  Dob stress echo 12/13:  Negative Dob stress echo. There is no evidence of ischemia.  The LVF is normal. b. Normal cors 2016.  . Carotid stenosis    a. <17% RICA, >49% LICA by duplex 07/4965  . Chronic chest pain    occ  . Constipation    chronic  . Dyspnea    with extertion  . ESRD (end stage renal disease) on dialysis [facility])    M-W-F- [name]  . GERD (gastroesophageal reflux disease)   . History of kidney stones    Passed  . HNP (herniated nucleus pulposus), lumbar   . HTN (hypertension)    echo 3/10: EF 60%, LAE  . Hyperlipidemia   . Nephrolithiasis    "passed them all"  . Peripheral arterial disease ([place])    a. s/p PTCA  right   . Pneumonia yrs ago  . Restless legs   . Sciatic leg pain   . Snores    a. presumed OSA,  pt has refused sleep eval in past.  . Type II diabetes mellitus ([place])    no longer on medications, checks blood glucose at home  . Urinary frequency    Past Surgical History:  Procedure Laterality Date  . ABDOMINAL AORTOGRAM W/LOWER EXTREMITY Bilateral 08/21/2019   Procedure: ABDOMINAL AORTOGRAM W/LOWER EXTREMITY;  Surgeon: Waynetta Sandy, MD;  Location: [place] [facility];  Service: Cardiovascular;  Laterality: Bilateral;  . AMPUTATION Right 07/25/2019   Procedure: RIGHT 5th RAY AMPUTATION;  Surgeon: Newt Minion, MD;  Location: [place];  Service: Orthopedics;  Laterality: Right;  . ANGIOPLASTY / STENTING FEMORAL Left 12/11/2013   dr berry  . AV FISTULA PLACEMENT Left 03/19/2014   Procedure: CREATION OF ARTERIOVENOUS (AV) FISTULA  LEFT UPPER ARM;  Surgeon: Mal Misty, MD;  Location: [place];  Service: Vascular;  Laterality: Left;  . BACK SURGERY  01/2018   screws placed   . CARDIAC CATHETERIZATION  2001 and 2010   . COLONOSCOPY W/ BIOPSIES AND POLYPECTOMY    . COLONOSCOPY WITH PROPOFOL N/A 08/01/2016   Procedure: COLONOSCOPY WITH PROPOFOL;  Surgeon: Carol Ada, MD;  Location: [facility];  Service: Endoscopy;  Laterality: N/A;  . CORONARY ARTERY BYPASS GRAFT  2019   [facility] x 1 bypass  .  ESOPHAGOGASTRODUODENOSCOPY (EGD) WITH PROPOFOL N/A 08/01/2016   Procedure: ESOPHAGOGASTRODUODENOSCOPY (EGD) WITH PROPOFOL;  Surgeon: Carol Ada, MD;  Location: [facility];  Service: Endoscopy;  Laterality: N/A;  . FOOT FRACTURE SURGERY Right    ligament repair  . FRACTURE SURGERY     left forearm  . GRAFT APPLICATION Right 11/12/4625   Procedure: FAT GRAFT APPLICATION;  Surgeon: Evelina Bucy, DPM;  Location: [place];  Service: Podiatry;  Laterality: Right;  . INGUINAL HERNIA REPAIR Left   . LEFT HEART CATH AND CORS/GRAFTS ANGIOGRAPHY N/A 04/27/2017   Procedure: LEFT HEART CATH AND CORS/GRAFTS ANGIOGRAPHY;  Surgeon: Leonie Man, MD;  Location: [place] [facility];  Service: Cardiovascular;  Laterality: N/A;  . LEFT HEART CATHETERIZATION WITH CORONARY ANGIOGRAM N/A 06/22/2014   Procedure: LEFT HEART CATHETERIZATION WITH CORONARY ANGIOGRAM;  Surgeon: Troy Sine, MD;  Location: [facility] CATH LAB;  Service: Cardiovascular;  Laterality: N/A;  . LOWER EXTREMITY ANGIOGRAM Left 12/11/2013   Procedure: LOWER EXTREMITY ANGIOGRAM;  Surgeon: Lorretta Harp, MD;  Location: [facility] CATH LAB;  Service: Cardiovascular;  Laterality: Left;  . LUMBAR LAMINECTOMY/DECOMPRESSION MICRODISCECTOMY Right 07/03/2017   Procedure: MICRODISCECTOMY LUMBAR FIVE - SACRAL ONE RIGHT;  Surgeon: Consuella Lose, MD;  Location: [place];  Service: Neurosurgery;  Laterality: Right;  . LUMBAR LAMINECTOMY/DECOMPRESSION MICRODISCECTOMY Right 10/19/2017   Procedure: MICRODISCECTOMY LUMBAR FIVE- SACRAL 1 ONE ;  Surgeon: Consuella Lose, MD;  Location: [place];  Service: Neurosurgery;  Laterality: Right;  . TONSILLECTOMY AND ADENOIDECTOMY    . WISDOM TOOTH EXTRACTION    . WOUND DEBRIDEMENT Right 05/13/2019   Procedure: DEBRIDEMENT WOUND;  Surgeon: Evelina Bucy, DPM;  Location: [facility];  Service: Podiatry;  Laterality: Right;   Family History  Problem Relation Age of Onset  . Heart attack Sister        died @ 66  . Cancer Mother        died @ 83; unknown type  . Diabetes Brother        deceased  . Cirrhosis Father        alcohol related  . Diabetes Father   . Esophageal cancer Neg Hx   . Colon cancer Neg Hx   . Pancreatic cancer Neg Hx   . Stomach cancer Neg Hx    Social History:  reports that he quit smoking about 45 years ago. His smoking use included cigarettes. He has a 2.00 pack-year smoking history. He has never used smokeless tobacco. He reports previous drug use. Drug: Marijuana. He reports that he does not drink alcohol.  ROS: As per HPI otherwise negative.  Physical Exam: Vitals:   11/19/19 1200 11/19/19 1215 11/19/19 1230 11/19/19 1305  BP: 117/72 115/75  121/75 126/73  Pulse: 60 (!) 59 (!) 57 (!) 59  Resp: 16 14 13 15   Temp:   97.6 F (36.4 C) 97.8 F (36.6 C)  TempSrc:    Oral  SpO2: 100% 100% 100% 100%  Weight:      Height:         General: Well developed, well nourished, in no acute distress. Head: Normocephalic, atraumatic, sclera non-icteric, mucus membranes are moist. Neck: Supple without lymphadenopathy/masses. JVD not elevated. Lungs: Clear bilaterally to auscultation without wheezes, rales, or rhonchi.  Heart: RRR with normal S1, S2. No murmurs, rubs, or gallops appreciated. Abdomen: Soft, non-tender, non-distended with normoactive bowel sounds. No rebound/guarding. No obvious abdominal masses. Musculoskeletal:  Strength and tone appear normal for age. Lower extremities: No  LLE edema; R foot bandaged with wound vac in place Neuro: Alert and oriented X 3. Moves all extremities spontaneously. Psych:  Responds to questions appropriately with a normal affect. Dialysis Access: LUE AVF which is aneurysmal with thinned areas, + thrill  Allergies  Allergen Reactions  . Kiwi Extract Itching, Swelling and Other (See Comments)    Lips and face swell- breathing not affected  . Flexeril [Cyclobenzaprine]     Hands become flimsy, can not hold things  . Tape Other (See Comments)    "Plastic" tape causes blisters!!   Prior to Admission medications   Medication Sig Start Date End Date Taking? Authorizing Provider  aspirin EC 81 MG tablet Take 1 tablet (81 mg total) by mouth daily. 05/28/12  Yes Weaver, Scott T, PA-C  atorvastatin (LIPITOR) 40 MG tablet Take 1 tablet (40 mg total) by mouth daily at 6 PM. 12/13/13  Yes Brett Canales, PA-C  calcium acetate (PHOSLO) 667 MG capsule Take 3 capsules (2,001 mg total) by mouth 3 (three) times daily with meals. Patient taking differently: Take 1,334 mg by mouth 2 (two) times daily.  10/09/15  Yes Short, Noah Delaine, MD  clopidogrel (PLAVIX) 75 MG tablet Take 75 mg by mouth daily. 07/29/18  Yes [provider]  gabapentin (NEURONTIN) 100 MG capsule Take 100 mg by mouth 2 (two) times daily.    Yes [provider]  lipase/protease/amylase (CREON) 36000 UNITS CPEP capsule Take 36,000 Units by mouth daily.   Yes [provider]  midodrine (PROAMATINE) 5 MG tablet Take 5 mg by mouth every Monday, Wednesday, and Friday. Take with Dialysis   Yes [provider]  multivitamin (RENA-VIT) TABS tablet Take 1 tablet by mouth at bedtime. 06/23/14  Yes Domenic Polite, MD  Naldemedine Tosylate (SYMPROIC PO) Take 1 tablet by mouth daily as needed.   Yes [provider]  oxyCODONE (OXY IR/ROXICODONE) 5 MG immediate release tablet Take 5 mg by mouth every 4 (four) hours as needed for severe pain.   Yes [provider]  pantoprazole (PROTONIX) 40 MG tablet Take 40 mg by mouth daily.   Yes [provider]  pentoxifylline (TRENTAL) 400 MG CR tablet Take 1 tablet (400 mg total) by mouth daily. 08/26/19  Yes Persons, Bevely Palmer, [place]  acetaminophen (TYLENOL) 500 MG tablet Take 1,000 mg by mouth every 4 (four) hours as needed for moderate pain or headache.     [provider]  clotrimazole (LOTRIMIN) 1 % cream Apply between toes and under toes twice daily for 4 weeks. Patient not taking: Reported on 11/13/2019 09/17/18   Marzetta Board, DPM  loperamide (IMODIUM) 2 MG capsule Take 2 mg by mouth as needed for diarrhea or loose stools.    [provider]  nitroGLYCERIN (NITRODUR - DOSED IN MG/24 HR) 0.2 mg/hr patch Place 1 patch (0.2 mg total) onto the skin daily. Patient not taking: Reported on 11/13/2019 08/07/19   Newt Minion, MD  nitroGLYCERIN (NITROSTAT) 0.4 MG SL tablet Place 1 tablet (0.4 mg total) under the tongue every 5 (five) minutes as needed for chest pain. 03/20/12   Barrett, Evelene Croon, PA-C  ONETOUCH ULTRA test strip  04/28/19   [provider]  polyethylene glycol (MIRALAX / GLYCOLAX) packet Take 17 g by mouth daily as needed  for moderate constipation. 03/22/17   Aline August, MD  senna-docusate (SENOKOT-S) 8.6-50 MG tablet Take 2 tablets by mouth 2 (two) times daily as needed for mild constipation. Patient not taking:  Reported on 11/13/2019 07/28/19 07/27/20  [place] Donnamarie Poag, DO  silver sulfADIAZINE (SILVADENE) 1 % cream Apply 1 application topically daily. 09/09/19   Suzan Slick, NP   Current Facility-Administered Medications  Medication Dose Route Frequency Provider Last Rate Last Admin  . 0.9 %  sodium chloride infusion   Intravenous Continuous Brennan Bailey, MD 10 mL/hr at 11/19/19 0858 Restarted at 11/19/19 1107  . 0.9 %  sodium chloride infusion   Intravenous Continuous Persons, Bevely Palmer, [place] 50 mL/hr at 11/19/19 1430 New Bag at 11/19/19 1430  . [START ON 11/20/2019] acetaminophen (TYLENOL) tablet 325-650 mg  325-650 mg Oral Q6H PRN Persons, Bevely Palmer, PA      . aspirin EC tablet 81 mg  81 mg Oral Daily Persons, Bevely Palmer, PA   81 mg at 11/19/19 1431  . atorvastatin (LIPITOR) tablet 40 mg  40 mg Oral q1800 Persons, Bevely Palmer, [place]      . calcium acetate (PHOSLO) capsule 1,334 mg  1,334 mg Oral BID WC Persons, Bevely Palmer, [place]      . Derrill Memo ON 11/20/2019] ceFAZolin (ANCEF) IVPB 1 g/50 mL premix  1 g Intravenous Q24H Pierce, Dwayne A, RPH      . [START ON 11/20/2019] Chlorhexidine Gluconate Cloth 2 % PADS 6 each  6 each Topical Q0600 Loren Racer, PA-C      . [START ON 11/20/2019] clopidogrel (PLAVIX) tablet 75 mg  75 mg Oral Daily Persons, Bevely Palmer, PA      . docusate sodium (COLACE) capsule 100 mg  100 mg Oral BID Persons, Bevely Palmer, PA      . gabapentin (NEURONTIN) capsule 100 mg  100 mg Oral BID Persons, Bevely Palmer, [place]      . HYDROmorphone (DILAUDID) injection 0.5 mg  0.5 mg Intravenous Q4H PRN Persons, Bevely Palmer, PA      . lipase/protease/amylase (CREON) capsule 36,000 Units  36,000 Units Oral Daily Persons, Bevely Palmer, [place]   36,000 Units at 11/19/19 1432  . midodrine (PROAMATINE) tablet 5 mg  5 mg Oral Q M,W,F  Persons, Bevely Palmer, PA      . multivitamin (RENA-VIT) tablet 1 tablet  1 tablet Oral QHS Persons, Bevely Palmer, PA      . nitroGLYCERIN (NITROSTAT) SL tablet 0.4 mg  0.4 mg Sublingual Q5 min PRN Persons, Bevely Palmer, PA      . ondansetron [facility]) tablet 4 mg  4 mg Oral Q6H PRN Persons, Bevely Palmer, PA       Or  . ondansetron (ZOFRAN) injection 4 mg  4 mg Intravenous Q6H PRN Persons, Bevely Palmer, PA      . oxyCODONE (Oxy IR/ROXICODONE) immediate release tablet 5-10 mg  5-10 mg Oral Q4H PRN Persons, Bevely Palmer, [place]      . Derrill Memo ON 11/20/2019] pantoprazole (PROTONIX) EC tablet 40 mg  40 mg Oral Daily Persons, Bevely Palmer, [place]       Labs: Basic Metabolic Panel: Recent Labs  Lab 11/19/19 0905  NA 135  K 4.0  CL 93*  GLUCOSE 94  BUN 42*  CREATININE 10.60*   CBC: Recent Labs  Lab 11/19/19 0905  HGB 14.6  HCT 43.0   CBG: Recent Labs  Lab 11/19/19 0809 11/19/19 1009 11/19/19 1146  GLUCAP 98 123* 133*   Dialysis Orders:  MWF @ [facility] 4hr, 450/800, EDW 71.5kg, 2K/2Ca, AVF, UFP #2, heparin 5000 - Parsabiv 2.81mcg IV q HD - Calcitriol 1.39mcg PO q HD  Assessment/Plan: 1.  PAD/R toe  osteomyelitis (s/p R TMA 11/19/19): Per [facility]. Wound vac in place. 2.  ESRD: Continue usual MWF schedule - HD today, no heparin. 3.  Hypertension/volume: BP controlled, no edema. Low UF goal. 4.  Anemia: Hgb 14.6 - no ESA needed. 5.  Metabolic bone disease: Ca/Phos pending - continue home binders. 6.  T2DM 7.  CAD  Veneta Penton, Hershal Coria 11/19/2019, 2:49 PM  [employer]

## 2019-11-19 NOTE — Anesthesia Procedure Notes (Signed)
Procedure Name: LMA Insertion Date/Time: 11/19/2019 11:13 AM Performed by: Barrington Ellison, CRNA Pre-anesthesia Checklist: Patient identified, Emergency Drugs available, Suction available and Patient being monitored Patient Re-evaluated:Patient Re-evaluated prior to induction Oxygen Delivery Method: Circle System Utilized Preoxygenation: Pre-oxygenation with 100% oxygen Induction Type: IV induction Ventilation: Mask ventilation without difficulty LMA: LMA inserted LMA Size: 5.0 Number of attempts: 1 Placement Confirmation: positive ETCO2 Tube secured with: Tape Dental Injury: Teeth and Oropharynx as per pre-operative assessment

## 2019-11-19 NOTE — Progress Notes (Signed)
PHARMACY NOTE:  ANTIMICROBIAL RENAL DOSAGE ADJUSTMENT  Current antimicrobial regimen includes a mismatch between antimicrobial dosage and estimated renal function.  As per policy approved by the Pharmacy & Therapeutics and Medical Executive Committees, the antimicrobial dosage will be adjusted accordingly.  Current antimicrobial dosage:  Cefazolin 1gm IV q6h x 3  Indication: surgical prophylaxis  Renal Function:  Estimated Creatinine Clearance: 6.8 mL/min (A) (by C-G formula based on SCr of 10.6 mg/dL (H)). []      On intermittent HD, scheduled: []      On CRRT    Antimicrobial dosage has been changed to:  Cefazolin 1gm q24h x 1  Additional comments:   Praise Stennett A. Levada Dy, PharmD, BCPS, FNKF Clinical Pharmacist [place] Please utilize Amion for appropriate phone number to reach the unit pharmacist ([place])

## 2019-11-19 NOTE — H&P (Signed)
Jon Anderson is an 69 y.o. male.   Chief Complaint: Right Foot necrotic ulcers HPI: Patient is a 69 year old gentleman who is status post revascularization to the right lower extremity with a stent placed by Dr. Vella Redhead.  Patient is also over 3 months out from a right fifth ray amputation.  He has been using Trental nitroglycerin patches and heel cord stretching.  He complains of painful necrotic ulcers over the fourth metatarsal head laterally and the tip of the great toe.  Patient states that his father had peripheral vascular disease as well and that his father died of the operating room table while undergoing a below-knee amputation.  Past Medical History:  Diagnosis Date  . Anemia of chronic disease   . CAD (coronary artery disease)    a.  Myoview 4/11: EF 53%, no scar or ischemia   c. MV 2012 Nl perfusion, apical thinning.  No ischemia or scar.  EF 49%, appears greater by visual estimate.;  d.  Dob stress echo 12/13:  Negative Dob stress echo. There is no evidence of ischemia.  The LVF is normal. b. Normal cors 2016.  . Carotid stenosis    a. <68% RICA, >12% LICA by duplex 10/5168  . Chronic chest pain    occ  . Constipation    chronic  . Dyspnea    with extertion  . ESRD (end stage renal disease) on dialysis [facility])    M-W-F- [name]  . GERD (gastroesophageal reflux disease)   . History of kidney stones    Passed  . HNP (herniated nucleus pulposus), lumbar   . HTN (hypertension)    echo 3/10: EF 60%, LAE  . Hyperlipidemia   . Nephrolithiasis    "passed them all"  . Peripheral arterial disease ([place])    a. s/p PTCA  right   . Pneumonia yrs ago  . Restless legs   . Sciatic leg pain   . Snores    a. presumed OSA, pt has refused sleep eval in past.  . Type II diabetes mellitus ([place])    no longer on medications, checks blood glucose at home  . Urinary frequency     Past Surgical History:  Procedure Laterality Date  . ABDOMINAL AORTOGRAM W/LOWER EXTREMITY Bilateral  08/21/2019   Procedure: ABDOMINAL AORTOGRAM W/LOWER EXTREMITY;  Surgeon: Waynetta Sandy, MD;  Location: [place] [facility];  Service: Cardiovascular;  Laterality: Bilateral;  . AMPUTATION Right 07/25/2019   Procedure: RIGHT 5th RAY AMPUTATION;  Surgeon: Newt Minion, MD;  Location: [place];  Service: Orthopedics;  Laterality: Right;  . ANGIOPLASTY / STENTING FEMORAL Left 12/11/2013   dr berry  . AV FISTULA PLACEMENT Left 03/19/2014   Procedure: CREATION OF ARTERIOVENOUS (AV) FISTULA  LEFT UPPER ARM;  Surgeon: Mal Misty, MD;  Location: [place];  Service: Vascular;  Laterality: Left;  . BACK SURGERY  01/2018   screws placed   . CARDIAC CATHETERIZATION  2001 and 2010   . COLONOSCOPY W/ BIOPSIES AND POLYPECTOMY    . COLONOSCOPY WITH PROPOFOL N/A 08/01/2016   Procedure: COLONOSCOPY WITH PROPOFOL;  Surgeon: Carol Ada, MD;  Location: [facility];  Service: Endoscopy;  Laterality: N/A;  . CORONARY ARTERY BYPASS GRAFT  2019   [facility] x [facility] bypass  . ESOPHAGOGASTRODUODENOSCOPY (EGD) WITH PROPOFOL N/A 08/01/2016   Procedure: ESOPHAGOGASTRODUODENOSCOPY (EGD) WITH PROPOFOL;  Surgeon: Carol Ada, MD;  Location: [facility];  Service: Endoscopy;  Laterality: N/A;  . FOOT FRACTURE SURGERY Right    ligament  repair  . FRACTURE SURGERY     left forearm  . GRAFT APPLICATION Right 05/17/621   Procedure: FAT GRAFT APPLICATION;  Surgeon: Evelina Bucy, DPM;  Location: [place];  Service: Podiatry;  Laterality: Right;  . INGUINAL HERNIA REPAIR Left   . LEFT HEART CATH AND CORS/GRAFTS ANGIOGRAPHY N/A 04/27/2017   Procedure: LEFT HEART CATH AND CORS/GRAFTS ANGIOGRAPHY;  Surgeon: [place]ie Man, MD;  Location: [place] [facility];  Service: Cardiovascular;  Laterality: N/A;  . LEFT HEART CATHETERIZATION WITH CORONARY ANGIOGRAM N/A 06/22/2014   Procedure: LEFT HEART CATHETERIZATION WITH CORONARY ANGIOGRAM;  Surgeon: Troy Sine, MD;  Location: [facility] C[facility] LAB;  Service:  Cardiovascular;  Laterality: N/A;  . LOWER EXTREMITY ANGIOGRAM Left 12/11/2013   Procedure: LOWER EXTREMITY ANGIOGRAM;  Surgeon: Lorretta Harp, MD;  Location[facility] CATH LAB;  Service: Cardiovascular;  Laterality: Left;  . LUMBAR LAMINECTOMY/DECOMPRESSION MICRODISCECTOMY Right 07/03/2017   Procedure: MICRODISCECTOMY LUMBAR FIVE - SACRAL ONE RIGHT;  Surgeon: Consuella Lose, MD;  Location: [place];  Service: Neurosurgery;  Laterality: Right;  . LUMBAR LAMINECTOMY/DECOMPRESSION MICRODISCECTOMY Right 10/19/2017   Procedure: MICRODISCECTOMY LUMBAR FIVE- SACRAL 1 ONE ;  Surgeon: Consuella Lose, MD;  Location: [place];  Service: Neurosurgery;  Laterality: Right;  . TONSILLECTOMY AND ADENOIDECTOMY    . WISDOM TOOTH EXTRACTION    . WOUND DEBRIDEMENT Right 05/13/2019   Procedure: DEBRIDEMENT WOUND;  Surgeon: Evelina Bucy, DPM;  Location[facility];  Service: Podiatry;  Laterality: Right;    Family History  Problem Relation Age of Onset  . Heart attack Sister        died @ 63  . Cancer Mother        died @ 21; unknown type  . Diabetes Brother        deceased  . Cirrhosis Father        alcohol related  . Diabetes Father   . Esophageal cancer Neg Hx   . Colon cancer Neg Hx   . Pancreatic cancer Neg Hx   . Stomach cancer Neg Hx    Social History:  reports that he quit smoking about 45 years ago. His smoking use included cigarettes. He has a 2.00 pack-year smoking history. He has never used smokeless tobacco. He reports previous drug use. Drug: Marijuana. He reports that he does not drink alcohol.  Allergies:  Allergies  Allergen Reactions  . Kiwi Extract Itching, Swelling and Other (See Comments)    Lips and face swell- breathing not affected  . Flexeril [Cyclobenzaprine]     Hands become flimsy, can not hold things  . Tape Other (See Comments)    "Plastic" tape causes blisters!!    No medications prior to admission.    Results for orders placed or performed during  the hospital encounter of 11/17/19 (from the past 48 hour(s))  SARS CORONAVIRUS 2 (TAT 6-24 HRS)     Status: None   Collection Time: 11/17/19  2:00 PM  Result Value Ref Range   SARS Coronavirus 2 NEGATIVE NEGATIVE    Comment: (NOTE) SARS-CoV-2 target nucleic acids are NOT DETECTED.  The SARS-CoV-2 RNA is generally detectable in upper and lower respiratory specimens during the acute phase of infection. Negative results do not preclude SARS-CoV-2 infection, do not rule out co-infections with other pathogens, and should not be used as the sole basis for treatment or other patient management decisions. Negative results must be combined with clinical observations, patient history, and epidemiological information. The expected  result is Negative.  Fact Sheet for Patients: SugarRoll.be  Fact Sheet for Healthcare Providers: https://www.woods-mathews.com/  This test is not yet approved or cleared by the [place] FDA and  has been authorized for detection and/or diagnosis of SARS-CoV-2 by FDA under an Emergency Use Authorization (EUA). This EUA will remain  in effect (meaning this test can be used) for the duration of the COVID-19 declaration under Se ction 564(b)(1) of the Act, 21 U.S.C. section 360bbb-3(b)(1), unless the authorization is terminated or revoked sooner.  Performed at [facility], [place] [address], [place], [place] [address]    No results found.  Review of Systems  All other systems reviewed and are negative.   Height 5\' 11"  (1.803 m), weight 72.6 kg. Physical Exam  Patient is alert, oriented, no adenopathy, well-dressed, normal affect, normal respiratory effort. Examination patient has swelling of the right foot compared to the left left foot does have a callus underneath the fifth metatarsal heads but no open ulcers no cellulitis.  The ulcer over the tip of the right great toe and right fourth toe are necrotic and  painful to touch.  Both ulcers probes to bone with a Q-tip. Heart RRR Lungs Clear  A Doppler was used and patient does have a biphasic dorsalis pedis and posterior tibial pulse. Assessment/Plan Discussed with the patient that we should proceed with a transmetatarsal amputation.  Discussed the biggest risk is wound healing with his calcified vessels and that he will need to be strictly nonweightbearing after surgery.  Anticipate we could proceed with surgery next Wednesday or Friday patient will need to be admitted and placed on dialysis and patient may need discharge to skilled nursing.  Bevely Palmer Chea Malan, PA 11/19/2019, 6:31 AM

## 2019-11-20 ENCOUNTER — Encounter (HOSPITAL_COMMUNITY): Payer: Self-pay | Admitting: Orthopedic Surgery

## 2019-11-20 LAB — GLUCOSE, CAPILLARY
Glucose-Capillary: 263 mg/dL — ABNORMAL HIGH (ref 70–99)
Glucose-Capillary: 110 mg/dL — ABNORMAL HIGH (ref 70–99)
Glucose-Capillary: 214 mg/dL — ABNORMAL HIGH (ref 70–99)

## 2019-11-20 LAB — CBC
HCT: 41.5 % (ref 39.0–52.0)
Hemoglobin: 13 g/dL (ref 13.0–17.0)
MCH: 29.3 pg (ref 26.0–34.0)
MCHC: 31.3 g/dL (ref 30.0–36.0)
MCV: 93.7 fL (ref 80.0–100.0)
Platelets: 181 10*3/uL (ref 150–400)
RBC: 4.43 MIL/uL (ref 4.22–5.81)
RDW: 14.6 % (ref 11.5–15.5)
WBC: 10.3 10*3/uL (ref 4.0–10.5)
nRBC: 0 % (ref 0.0–0.2)

## 2019-11-20 LAB — RENAL FUNCTION PANEL
Albumin: 3.1 g/dL — ABNORMAL LOW (ref 3.5–5.0)
Anion gap: 15 (ref 5–15)
BUN: 53 mg/dL — ABNORMAL HIGH (ref 8–23)
CO2: 25 mmol/L (ref 22–32)
Calcium: 8.9 mg/dL (ref 8.9–10.3)
Chloride: 92 mmol/L — ABNORMAL LOW (ref 98–111)
Creatinine, Ser: 11.84 mg/dL — ABNORMAL HIGH (ref 0.61–1.24)
GFR calc Af Amer: 4 mL/min — ABNORMAL LOW (ref >60)
GFR calc non Af Amer: 4 mL/min — ABNORMAL LOW (ref >60)
Glucose, Bld: 172 mg/dL — ABNORMAL HIGH (ref 70–99)
Phosphorus: 3.3 mg/dL (ref 2.5–4.6)
Potassium: 6.2 mmol/L — ABNORMAL HIGH (ref 3.5–5.1)
Sodium: 132 mmol/L — ABNORMAL LOW (ref 135–145)

## 2019-11-20 MED ORDER — MIDODRINE HCL 5 MG PO TABS: 5.0000 mg | ORAL_TABLET | Freq: Once | ORAL | Status: AC

## 2019-11-20 MED ORDER — LIDOCAINE-PRILOCAINE 2.5-2.5 % EX CREA
1.0000 "application " | TOPICAL_CREAM | CUTANEOUS | Status: DC | PRN
  Filled 2019-11-20: qty 5

## 2019-11-20 MED ORDER — NEPRO/CARBSTEADY PO LIQD
237.0000 mL | Freq: Two times a day (BID) | ORAL | Status: DC
  Administered 2019-11-20 – 2019-11-25 (×8): 237 mL via ORAL

## 2019-11-20 MED ORDER — SODIUM CHLORIDE 0.9 % IV SOLN: 100.0000 mL | INTRAVENOUS | Status: DC | PRN

## 2019-11-20 MED ORDER — ALTEPLASE 2 MG IJ SOLR: 2.0000 mg | Freq: Once | INTRAMUSCULAR | Status: DC | PRN

## 2019-11-20 MED ORDER — CHLORHEXIDINE GLUCONATE CLOTH 2 % EX PADS
6.0000 | MEDICATED_PAD | Freq: Every day | CUTANEOUS | Status: DC
  Administered 2019-11-20 – 2019-11-24 (×4): 6 via TOPICAL

## 2019-11-20 MED ORDER — MIDODRINE HCL 5 MG PO TABS
ORAL_TABLET | ORAL | Status: AC
  Administered 2019-11-20: 5 mg via ORAL
  Filled 2019-11-20: qty 1

## 2019-11-20 MED ORDER — LIDOCAINE HCL (PF) 1 % IJ SOLN: 5.0000 mL | INTRAMUSCULAR | Status: DC | PRN

## 2019-11-20 MED ORDER — PENTAFLUOROPROP-TETRAFLUOROETH EX AERO: 1.0000 "application " | INHALATION_SPRAY | CUTANEOUS | Status: DC | PRN

## 2019-11-20 NOTE — Progress Notes (Addendum)
  [facility] Progress Note   Subjective:  Seen on HD - says pain was stable overnight. No CP/dyspnea. 1.5L UFG planned but hypotensive  - UF off currently.   Objective Vitals:   11/20/19 0706 11/20/19 0732 11/20/19 0800 11/20/19 0830  BP: 98/61 (!) 92/52 (!) 90/53 (!) 84/42  Pulse: 60 63 63 63  Resp: 16 16 16 16   Temp:      TempSrc:      SpO2:      Weight:      Height:       Physical Exam General: Well appearing man, NAD. Room air. Heart: RRR; no murmur Lungs: CTA anteriorly Abdomen: soft, non-tender Extremities: No LLE edema, R foot bandaged with wound vac Dialysis Access: L AVF + thrill  Additional Objective Labs: Basic Metabolic Panel: Recent Labs  Lab 11/19/19 0905 11/20/19 0721  NA 135 132*  K 4.0 6.2*  CL 93* 92*  CO2  --  25  GLUCOSE 94 172*  BUN 42* 53*  CREATININE 10.60* 11.84*  CALCIUM  --  8.9  PHOS  --  3.3   Liver Function Tests: Recent Labs  Lab 11/20/19 0721  ALBUMIN 3.1*   CBC: Recent Labs  Lab 11/19/19 0905 11/20/19 0721  WBC  --  10.3  HGB 14.6 13.0  HCT 43.0 41.5  MCV  --  93.7  PLT  --  181   Medications: . sodium chloride 10 mL/hr at 11/19/19 0858  . sodium chloride 50 mL/hr at 11/19/19 1430  . sodium chloride    . sodium chloride    .  ceFAZolin (ANCEF) IV     . aspirin EC  81 mg Oral Daily  . atorvastatin  40 mg Oral q1800  . calcium acetate  1,334 mg Oral BID WC  . Chlorhexidine Gluconate Cloth  6 each Topical Q0600  . clopidogrel  75 mg Oral Daily  . docusate sodium  100 mg Oral BID  . gabapentin  100 mg Oral BID  . lipase/protease/amylase  36,000 Units Oral Daily  . midodrine  5 mg Oral Q M,W,F  . multivitamin  1 tablet Oral QHS  . pantoprazole  40 mg Oral Daily    Dialysis Orders: MWF @ [facility] 4hr, 450/800, EDW 71.5kg, 2K/2Ca, AVF, UFP #2, heparin 5000 - Parsabiv 2.54mcg IV q HD - Calcitriol 1.35mcg PO q HD  Assessment/Plan: 1.  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 Wound vac  in place. Will need PT/OT eval to determine discharge plan. 2.  ESRD: Continue usual MWF schedule - HD today as rollover from yesterday. K high today, should correct with HD. Next HD tomorrow. 3.  Hypotension/volume: BP low - asymptomatic - on mido pre-HD and looks like didn't get this morning - will give now, no edema. 4.  Anemia: Hgb 13 - no ESA needed. 5.  Metabolic bone disease: Ca/Phos good - continue home binders. 6. Nutrition: Alb low - will add Nepro protein supps. 7. T2DM 8. CAD   Veneta Penton, Hershal Coria 11/20/2019, 9:05 AM  [employer]

## 2019-11-20 NOTE — Progress Notes (Signed)
OT Cancellation Note  Patient Details Name: Jon Anderson MRN: [id number] DOB: 09/25/1950   Cancelled Treatment:    Reason Eval/Treat Not Completed: Patient at procedure or test/ unavailable (Pt currently in HD, will try back.)  [name] 11/20/2019, 7:55 AM  Nestor Lewandowsky, OTR/L Acute Rehabilitation Services Pager: [phone] Office: [phone]

## 2019-11-20 NOTE — Plan of Care (Signed)

## 2019-11-20 NOTE — Evaluation (Signed)
Physical Therapy Evaluation Patient Details Name: Jon Anderson MRN: [id number] DOB: 1951-03-31 Today's Date: 11/20/2019   History of Present Illness  Pt is a 69 year old man admitted with chronic, non-healing wounds of R toes warranting transmetatarsal amputation.  PMH: ESRD, CAD, DM, HTN, PAD, chronic anemia.  Clinical Impression  Pt admitted with above diagnosis. Pt presenting with decreased mobility, R LE strength, balance, and safety.  He required min A for sit to stand and ambulation with RW.  Pt was able to use RW but fatigued easily with NWB/TDWB status.  Pt does have good family support but would benefit from further therapy to improve independence and decrease caregiver burden. Pt currently with functional limitations due to the deficits listed below (see PT Problem List). Pt will benefit from skilled PT to increase their independence and safety with mobility to allow discharge to the venue listed below.       Follow Up Recommendations CIR    Equipment Recommendations  Rolling walker with 5" wheels;3in1 (PT)    Recommendations for Other Services Rehab consult     Precautions / Restrictions Precautions Precautions: Fall Restrictions Weight Bearing Restrictions: Yes RLE Weight Bearing: Touchdown weight bearing Other Position/Activity Restrictions: wound vac      Mobility  Bed Mobility Overal bed mobility: Modified Independent             General bed mobility comments: HOB up, no assist  Transfers Overall transfer level: Needs assistance Equipment used: Rolling walker (2 wheeled) Transfers: Sit to/from Stand Sit to Stand: Min assist         General transfer comment: cues for technique and hand placement, assist for balance during transition of hands to walker  Ambulation/Gait Ambulation/Gait assistance: Min assist Gait Distance (Feet): 35 Feet Assistive device: Rolling walker (2 wheeled)   Gait velocity: decreased   General Gait Details: Pt able  to hop on L LE with NWB status on R LE.  Cued for RW use and proximity.  Min A at times for steadying  Stairs            Wheelchair Mobility    Modified Rankin (Stroke Patients Only)       Balance Overall balance assessment: Needs assistance Sitting-balance support: No upper extremity supported Sitting balance-Leahy Scale: Good     Standing balance support: Bilateral upper extremity supported;Single extremity supported Standing balance-Leahy Scale: Poor Standing balance comment: Pt removed 1 hand from RW to pull up face mask and was mildly unsteady; with both hands required min guard                             Pertinent Vitals/Pain Pain Assessment: No/denies pain    Home Living Family/patient expects to be discharged to:: Private residence Living Arrangements: Spouse/significant other;Children Available Help at Discharge: Family;Available 24 hours/day Type of Home: House Home Access: Stairs to enter Entrance Stairs-Rails: None Entrance Stairs-Number of Steps: 3 Home Layout: One level Home Equipment: Walker - 2 wheels;Cane - single point;Bedside commode      Prior Function Level of Independence: Independent with assistive device(s)         Comments: ambulates with use of a cane, drives, retired Interior and spatial designer [employer]   Dominant Hand: Right    Extremity/Trunk Assessment   Upper Extremity Assessment Upper Extremity Assessment: Overall WFL for tasks assessed;Defer to OT evaluation    Lower Extremity Assessment Lower Extremity Assessment: LLE deficits/detail;RLE deficits/detail RLE Deficits /  Details: ROM: WFL (PROM of ankle); MMT: hip and knee 4+/5, ankle 0/5 (noted pt did have nerve block and still reports numbness mid shin to foot) LLE Deficits / Details: ROM WFL; MMT 5/5    Cervical / Trunk Assessment Cervical / Trunk Assessment: Normal  Communication   Communication: No difficulties  Cognition Arousal/Alertness:  Awake/alert Behavior During Therapy: WFL for tasks assessed/performed Overall Cognitive Status: Within Functional Limits for tasks assessed                                        General Comments General comments (skin integrity, edema, etc.): Encouraged to perform LE AROM as able - including dorsiflexion if strength improves    Exercises Other Exercises Other Exercises: gentle R ankle PROM x 5   Assessment/Plan    PT Assessment Patient needs continued PT services  PT Problem List Decreased strength;Decreased mobility;Decreased safety awareness;Decreased range of motion;Decreased activity tolerance;Decreased balance;Decreased knowledge of use of DME;Impaired sensation;Decreased knowledge of precautions       PT Treatment Interventions DME instruction;Therapeutic activities;Gait training;Therapeutic exercise;Patient/family education;Stair training;Balance training;Functional mobility training    PT Goals (Current goals can be found in the Care Plan section)  Acute Rehab PT Goals Patient Stated Goal: to not be a burden to his family - open to rehab at d/c PT Goal Formulation: With patient Time For Goal Achievement: 12/04/19 Potential to Achieve Goals: Good    Frequency Min 4X/week   Barriers to discharge Decreased caregiver support family not able to provide physical assist needed at this time    Co-evaluation               AM-PAC PT "6 Clicks" Mobility  Outcome Measure Help needed turning from your back to your side while in a flat bed without using bedrails?: None Help needed moving from lying on your back to sitting on the side of a flat bed without using bedrails?: None Help needed moving to and from a bed to a chair (including a wheelchair)?: A Little Help needed standing up from a chair using your arms (e.g., wheelchair or bedside chair)?: A Little Help needed to walk in hospital room?: A Little Help needed climbing 3-5 steps with a railing? : A  Lot 6 Click Score: 19    End of Session Equipment Utilized During Treatment: Gait belt Activity Tolerance: Patient tolerated treatment well Patient left: with chair alarm set;in chair;with call bell/phone within reach Nurse Communication: Mobility status PT Visit Diagnosis: Unsteadiness on feet (R26.81);Other abnormalities of gait and mobility (R26.89);Muscle weakness (generalized) (M62.81)    Time: 5597-4163 PT Time Calculation (min) (ACUTE ONLY): 18 min   Charges:   PT Evaluation $PT Eval Low Complexity: 1 Low          Asser Lucena, PT Acute Rehab Services Pager [phone] [facility] Rehab [phone]    Karlton Lemon 11/20/2019, 3:24 PM

## 2019-11-20 NOTE — Progress Notes (Signed)
Patient is postop day 1 status post transmetatarsal amputation.  He is in dialysis.  Doing well and appears comfortable.  Wound VAC is working.   Vital signs stable afebrile.  Patient will be discharged to inpatient rehab or nursing facility.

## 2019-11-20 NOTE — Progress Notes (Signed)
Inpatient Rehab Admissions:  Inpatient Rehab Consult received.  Await recommendations from therapy evaluations.   Signed: Shann Medal, PT, DPT Admissions Coordinator [phone] 11/20/19  12:41 PM

## 2019-11-20 NOTE — TOC Initial Note (Signed)
Transition of Care [facility]) - Initial/Assessment Note    Patient Details  Name: Jon Anderson MRN: [id number] Date of Birth: 05-21-1950  Transition of Care [facility]) CM/SW Contact:    Curlene Labrum, RN Phone Number: 11/20/2019, 1:27 PM  Clinical Narrative:                 Case management met with the patient S/P Right transmetatarsal amputation by Dr. Sharol Given - with intact prevena wound vac noted.  The patient lives with his wife at home with HD on MWF at [facility].  The patient has no history of SNF placement and is waiting on PT evaluation for possible CIR placement if insurance approves.  The patient has had both Phizer vaccines in the past.  Plan to follow for transfer to CIR if accepted.  Waiting for PT eval.  Expected Discharge Plan: [place] Barriers to Discharge: Continued Medical Work up   Patient Goals and CMS Choice Patient states their goals for this hospitalization and ongoing recovery are:: Patient plans to go to CIR or SNF placement.   Choice offered to / list presented to : Patient  Expected Discharge Plan and Services Expected Discharge Plan: [place]   Discharge Planning Services: CM Consult Post Acute Care Choice: IP Rehab Living arrangements for the past 2 months: Single Family Home                                      Prior Living Arrangements/Services Living arrangements for the past 2 months: Single Family Home Lives with:: Spouse Patient language and need for interpreter reviewed:: Yes Do you feel safe going back to the place where you live?: Yes      Need for Family Participation in Patient Care: Yes (Comment) Care giver support system in place?: Yes (comment)   Criminal Activity/Legal Involvement Pertinent to Current Situation/Hospitalization: No - Comment as needed  Activities of Daily Living Home Assistive Devices/Equipment: Cane (specify quad or straight), Blood pressure cuff, CBG Meter, Dentures (specify type),  Eyeglasses ADL Screening (condition at time of admission) Patient's cognitive ability adequate to safely complete daily activities?: Yes Is the patient deaf or have difficulty hearing?: No Does the patient have difficulty seeing, even when wearing glasses/contacts?: No Does the patient have difficulty concentrating, remembering, or making decisions?: No Patient able to express need for assistance with ADLs?: Yes Does the patient have difficulty dressing or bathing?: No Independently performs ADLs?: Yes (appropriate for developmental age) Does the patient have difficulty walking or climbing stairs?: Yes Weakness of Legs: None Weakness of Arms/Hands: None  Permission Sought/Granted Permission sought to share information with : Case Manager Permission granted to share information with : Yes, Verbal Permission Granted        Permission granted to share info w Relationship: spouse     Emotional Assessment Appearance:: Appears stated age Attitude/Demeanor/Rapport: Gracious Affect (typically observed): Accepting Orientation: : Oriented to Self, Oriented to Place, Oriented to  Time, Oriented to Situation Alcohol / Substance Use: Not Applicable Psych Involvement: No (comment)  Admission diagnosis:  Gangrene of right foot [facility]) [I96] Patient Active Problem List   Diagnosis Date Noted  . Gangrene of right foot ([place]) 11/19/2019  . 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 Ischemic ulcer diabetic foot ([place]) 09/09/2019  . Subacute osteomyelitis, right ankle and foot ([place])   . Diabetic foot  infection ([place]) 07/24/2019  . Chronic diastolic CHF (congestive heart failure) ([place]) 07/24/2019  . Diabetic ulcer of right midfoot associated with diabetes mellitus due to underlying condition, with fat layer exposed ([place])   . Gastroenteritis 04/24/2019  . Allergy, unspecified, initial encounter 01/27/2019  . DM neuropathy with neurologic complication ([place])  74/71/8550  . GERD (gastroesophageal reflux disease) 02/28/2018  . Nephrolithiasis 02/28/2018  . Sciatic leg pain 02/28/2018  . Snores 02/28/2018  . Orthopnea 02/23/2018  . Elevated troponin 02/23/2018  . HNP (herniated nucleus pulposus), lumbar 07/03/2017  . Encounter for removal of sutures 06/01/2017  . Chest pain in adult 04/27/2017  . Restless leg syndrome, uncontrolled 04/27/2017  . 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 Anemia of chronic disease 03/17/2017  . ESRD (end stage renal disease) on dialysis ([place]) 03/17/2017  . Chronic chest pain 03/17/2017  . Type II diabetes mellitus ([place]) 03/17/2017  . Acute on chronic diastolic heart failure ([place]) 03/17/2017  . Acute heart failure ([place]) 03/17/2017  . Lumbar radiculopathy 01/16/2017  . Pre-transplant evaluation for kidney transplant 06/15/2016  . Pain, unspecified 04/17/2016  . Increased frequency of urination 12/17/2015  . Nocturia 12/17/2015  . Chest pain, non-cardiac 10/09/2015  . Hypercalcemia 05/10/2015  . Other fluid overload 02/24/2015  . 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 Fatty (change of) liver, not elsewhere classified 07/02/2014  . Aftercare including intermittent dialysis ([place]) 06/24/2014  . Diarrhea, unspecified 06/24/2014  . Fever, unspecified 06/24/2014  . Iron deficiency anemia, unspecified 06/24/2014  . Other specified coagulation defects ([place]) 06/24/2014  . Pruritus, unspecified 06/24/2014  . Secondary hyperparathyroidism of renal origin ([place]) 06/24/2014  . Type 2 diabetes mellitus with diabetic peripheral angiopathy without gangrene ([place]) 06/24/2014  . Acute on chronic renal failure ([place]) 06/16/2014  . Shoulder pain, left 12/15/2013  . Chest pain 12/15/2013  . Claudication ([place]) 12/11/2013  . PVD (peripheral vascular disease) ([place]) 12/11/2013  . Carotid artery disease ([place]) 09/30/2013  . Acute chest pain 11/15/2012  . Bruit 09/15/2010  . CAD  (coronary artery disease) nonobstructive per cath 2012   . Hyperlipidemia LDL goal <70 07/22/2009  . Essential hypertension 07/22/2009   PCP:  [name], MD Pharmacy:   [id number] [id number] [id number]: [phone]: [phone]    Social Determinants of Health (SDOH) Interventions    Readmission Risk Interventions Readmission Risk Prevention Plan 11/20/2019  Transportation Screening Complete  PCP or Specialist Appt within 3-5 Days Complete  [place] or [place]  Social Work Consult for RRoePlanning/Counseling Complete  Palliative Care Screening Complete  Medication Review (Press photographer Complete  Some recent data might be hidden

## 2019-11-20 NOTE — Evaluation (Signed)
Occupational Therapy Evaluation Patient Details Name: Jon Anderson MRN: [id number] DOB: [birth year] Today's Date: 11/20/2019    History of Present Illness Pt is a 69 year old man admitted with chronic, non-healing wounds of R toes warranting transmetatarsal amputation.  PMH: ESRD, CAD, DM, HTN, PAD, chronic anemia.   Clinical Impression   Pt ambulated with a cane and was independent in ADL and IADL prior to admission. Pt presents with generalized weakness and poor standing balance. He requires min assist for OOB mobility and up to moderate assistance for ADL. Pt has excellent potential to function modified independently with intensive, short-term rehab in CIR. Will follow acutely.    Follow Up Recommendations  CIR    Equipment Recommendations  Tub/shower bench    Recommendations for Other Services Rehab consult     Precautions / Restrictions Precautions Precautions: Fall Restrictions Weight Bearing Restrictions: Yes RLE Weight Bearing: Non weight bearing Other Position/Activity Restrictions: wound vac      Mobility Bed Mobility Overal bed mobility: Modified Independent             General bed mobility comments: HOB up, no assist  Transfers Overall transfer level: Needs assistance Equipment used: Rolling walker (2 wheeled) Transfers: Sit to/from Stand Sit to Stand: Min assist         General transfer comment: cues for technique and hand placement, assist for balance during transition of hands to walker    Balance Overall balance assessment: Needs assistance   Sitting balance-Leahy Scale: Normal     Standing balance support: Bilateral upper extremity supported Standing balance-Leahy Scale: Poor                             ADL either performed or assessed with clinical judgement   ADL Overall ADL's : Needs assistance/impaired Eating/Feeding: Independent   Grooming: Set up;Sitting   Upper Body Bathing: Set up;Sitting   Lower Body  Bathing: Moderate assistance;Sitting/lateral leans   Upper Body Dressing : Set up;Sitting   Lower Body Dressing: Moderate assistance;Sitting/lateral leans   Toilet Transfer: Minimal assistance;Ambulation;BSC;RW           Functional mobility during ADLs: Minimal assistance;Rolling walker General ADL Comments: Began educating pt in compensatory strategies for LB bathing and dressing.     Vision Baseline Vision/History: Wears glasses Wears Glasses: Reading only       Perception     Praxis      Pertinent Vitals/Pain Pain Assessment: No/denies pain     Hand Dominance Right   Extremity/Trunk Assessment Upper Extremity Assessment Upper Extremity Assessment: Overall WFL for tasks assessed (UEs fatigue quickly with walker use)   Lower Extremity Assessment Lower Extremity Assessment: Defer to PT evaluation   Cervical / Trunk Assessment Cervical / Trunk Assessment: Normal   Communication Communication Communication: No difficulties   Cognition Arousal/Alertness: Awake/alert Behavior During Therapy: WFL for tasks assessed/performed Overall Cognitive Status: Within Functional Limits for tasks assessed                                     General Comments       Exercises     Shoulder Instructions      Home Living Family/patient expects to be discharged to:: Private residence Living Arrangements: Spouse/significant other;Children Available Help at Discharge: Family;Available 24 hours/day Type of Home: House Home Access: Stairs to enter [employer] of Steps: 3 Entrance  Stairs-Rails: None Home Layout: One level     Bathroom Shower/Tub: Tub/shower unit;Curtain   Bathroom Toilet: Handicapped height     Home Equipment: Environmental consultant - 2 wheels;Cane - single point;Bedside commode          Prior Functioning/Environment Level of Independence: Independent with assistive device(s)        Comments: ambulates with use of a cane, drives,  retired custodian        OT Problem List: Decreased strength;Impaired balance (sitting and/or standing);Decreased knowledge of use of DME or AE;Decreased knowledge of precautions      OT Treatment/Interventions: Self-care/ADL training;DME and/or AE instruction;Patient/family education;Balance training;Therapeutic activities    OT Goals(Current goals can be found in the care plan section) Acute Rehab OT Goals Patient Stated Goal: to not be a burden to his family OT Goal Formulation: With patient Time For Goal Achievement: 12/04/19 Potential to Achieve Goals: Good ADL Goals Pt Will Perform Lower Body Bathing: with modified independence;sitting/lateral leans Pt Will Perform Lower Body Dressing: with modified independence;sitting/lateral leans Pt Will Transfer to Toilet: with modified independence;ambulating;bedside commode (over toilet) Pt Will Perform Toileting - Clothing Manipulation and hygiene: with modified independence;sitting/lateral leans Pt Will Perform Tub/Shower Transfer: Tub transfer;with supervision;ambulating;shower seat;tub bench;rolling walker Additional ADL Goal #1: Pt will gather items necessary for ADL with RW adhering to NWB on R LE.  OT Frequency: Min 2X/week   Barriers to D/C:            Co-evaluation              AM-PAC OT "6 Clicks" Daily Activity     Outcome Measure Help from another person eating meals?: None Help from another person taking care of personal grooming?: None Help from another person toileting, which includes using toliet, bedpan, or urinal?: A Little Help from another person bathing (including washing, rinsing, drying)?: A Lot Help from another person to put on and taking off regular upper body clothing?: A Little Help from another person to put on and taking off regular lower body clothing?: A Lot 6 Click Score: 18   End of Session Equipment Utilized During Treatment: Gait belt;Rolling walker  Activity Tolerance: Patient tolerated  treatment well Patient left: in chair;with call bell/phone within reach;with chair alarm set  OT Visit Diagnosis: Unsteadiness on feet (R26.81);Other abnormalities of gait and mobility (R26.89);Muscle weakness (generalized) (M62.81)                Time: 3790-2409 OT Time Calculation (min): 14 min Charges:  OT General Charges $OT Visit: 1 Visit OT Evaluation $OT Eval Moderate Complexity: 1 Mod  Nestor Lewandowsky, OTR/L Acute Rehabilitation Services Pager: [phone] Office: [phone]  [name] 11/20/2019, 2:57 PM

## 2019-11-21 ENCOUNTER — Encounter (HOSPITAL_COMMUNITY): Payer: Medicare Other

## 2019-11-21 ENCOUNTER — Ambulatory Visit: Payer: Medicare Other | Admitting: Vascular Surgery

## 2019-11-21 LAB — RENAL FUNCTION PANEL
Albumin: 2.9 g/dL — ABNORMAL LOW (ref 3.5–5.0)
Anion gap: 12 (ref 5–15)
BUN: 32 mg/dL — ABNORMAL HIGH (ref 8–23)
CO2: 28 mmol/L (ref 22–32)
Calcium: 8.5 mg/dL — ABNORMAL LOW (ref 8.9–10.3)
Chloride: 97 mmol/L — ABNORMAL LOW (ref 98–111)
Creatinine, Ser: 8.08 mg/dL — ABNORMAL HIGH (ref 0.61–1.24)
GFR calc Af Amer: 7 mL/min — ABNORMAL LOW (ref >60)
GFR calc non Af Amer: 6 mL/min — ABNORMAL LOW (ref >60)
Glucose, Bld: 133 mg/dL — ABNORMAL HIGH (ref 70–99)
Phosphorus: 4.8 mg/dL — ABNORMAL HIGH (ref 2.5–4.6)
Potassium: 4.4 mmol/L (ref 3.5–5.1)
Sodium: 137 mmol/L (ref 135–145)

## 2019-11-21 LAB — CBC
HCT: 40.3 % (ref 39.0–52.0)
Hemoglobin: 12.5 g/dL — ABNORMAL LOW (ref 13.0–17.0)
MCH: 29.3 pg (ref 26.0–34.0)
MCHC: 31 g/dL (ref 30.0–36.0)
MCV: 94.4 fL (ref 80.0–100.0)
Platelets: 164 10*3/uL (ref 150–400)
RBC: 4.27 MIL/uL (ref 4.22–5.81)
RDW: 15.2 % (ref 11.5–15.5)
WBC: 9.3 10*3/uL (ref 4.0–10.5)
nRBC: 0 % (ref 0.0–0.2)

## 2019-11-21 LAB — GLUCOSE, CAPILLARY
Glucose-Capillary: 82 mg/dL (ref 70–99)
Glucose-Capillary: 167 mg/dL — ABNORMAL HIGH (ref 70–99)

## 2019-11-21 MED ORDER — HYDROMORPHONE HCL 1 MG/ML IJ SOLN
INTRAMUSCULAR | Status: AC
  Filled 2019-11-21: qty 0.5

## 2019-11-21 MED ORDER — MIDODRINE HCL 5 MG PO TABS
ORAL_TABLET | ORAL | Status: AC
  Filled 2019-11-21: qty 1

## 2019-11-21 MED ORDER — OXYCODONE HCL 5 MG PO TABS
ORAL_TABLET | ORAL | Status: AC
  Administered 2019-11-21: 10 mg
  Filled 2019-11-21: qty 2

## 2019-11-21 NOTE — Progress Notes (Signed)
Inpatient Rehab Admissions:  Inpatient Rehab Consult received.  I met with patient at the bedside for rehabilitation assessment and to discuss goals and expectations of an inpatient rehab admission.  He is reporting a lot of pain (does not rate on 0-10 scale).  He is open to CIR and reports will have good support from his wife and daughter (both of whom live with him).  Will need insurance prior auth for CIR.  Will send case to be opened today.   Signed: Shann Medal, PT, DPT Admissions Coordinator [phone] 11/21/19  4:09 PM

## 2019-11-21 NOTE — Progress Notes (Signed)
Physical Therapy Treatment Patient Details Name: Jon Anderson MRN: [id number] DOB: 1950/10/27 Today's Date: 11/21/2019    History of Present Illness Pt is a 69 year old man admitted with chronic, non-healing wounds of R toes warranting transmetatarsal amputation.  PMH: ESRD, CAD, DM, HTN, PAD, chronic anemia.    PT Comments    Pt with increased pain today that limited treatment (was numb yesterday).  Pt was able to actively dorsiflex on the R.  He was able to take a few steps in the room.  Pt with soft BP (89/49), but typically runs soft - he was asymptomatic.  Cont POC   Follow Up Recommendations  CIR     Equipment Recommendations  Rolling walker with 5" wheels;3in1 (PT)    Recommendations for Other Services Rehab consult     Precautions / Restrictions Precautions Precautions: Fall Restrictions Weight Bearing Restrictions: Yes RLE Weight Bearing: Touchdown weight bearing Other Position/Activity Restrictions: wound vac    Mobility  Bed Mobility Overal bed mobility: Modified Independent             General bed mobility comments: HOB up, no assist  Transfers Overall transfer level: Needs assistance Equipment used: Rolling walker (2 wheeled) Transfers: Sit to/from Stand Sit to Stand: Min assist         General transfer comment: cues for technique and hand placement, assist for balance during transition of hands to walker  Ambulation/Gait Ambulation/Gait assistance: Min assist Gait Distance (Feet): 10 Feet Assistive device: Rolling walker (2 wheeled)       General Gait Details: Pt able to hop on L LE with NWB status on R LE.  Cued for RW use and proximity.  Min A at times for steadying. Limited distance due to pain.   Stairs             Wheelchair Mobility    Modified Rankin (Stroke Patients Only)       Balance Overall balance assessment: Needs assistance Sitting-balance support: No upper extremity supported Sitting balance-Leahy  Scale: Good     Standing balance support: Bilateral upper extremity supported;Single extremity supported Standing balance-Leahy Scale: Poor                              Cognition Arousal/Alertness: Awake/alert Behavior During Therapy: WFL for tasks assessed/performed Overall Cognitive Status: Within Functional Limits for tasks assessed                                        Exercises General Exercises - Lower Extremity Ankle Circles/Pumps: AROM;Both;Seated    General Comments General comments (skin integrity, edema, etc.): Educated on gentle tactile stimulation s/p amputation for phantom pain/pins and needles sensations      Pertinent Vitals/Pain Pain Assessment: 0-10 Pain Score: 9  Pain Location: R foot Pain Descriptors / Indicators: Throbbing;Pins and needles Pain Intervention(s): Limited activity within patient's tolerance;Monitored during session;Repositioned;Patient requesting pain meds-RN notified;Other (comment) (educated on gentle tactile stimulation)    Home Living                      Prior Function            PT Goals (current goals can now be found in the care plan section) Acute Rehab PT Goals Patient Stated Goal: to not be a burden to his family - open to  rehab at d/c PT Goal Formulation: With patient Time For Goal Achievement: 12/04/19 Potential to Achieve Goals: Good Progress towards PT goals: Progressing toward goals    Frequency    Min 4X/week      PT Plan Current plan remains appropriate    Co-evaluation              AM-PAC PT "6 Clicks" Mobility   Outcome Measure  Help needed turning from your back to your side while in a flat bed without using bedrails?: None Help needed moving from lying on your back to sitting on the side of a flat bed without using bedrails?: None Help needed moving to and from a bed to a chair (including a wheelchair)?: A Little Help needed standing up from a chair using  your arms (e.g., wheelchair or bedside chair)?: A Little Help needed to walk in hospital room?: A Little Help needed climbing 3-5 steps with a railing? : A Lot 6 Click Score: 19    End of Session Equipment Utilized During Treatment: Gait belt Activity Tolerance: Patient Limited by pain Patient left: with chair alarm set;in chair;with call bell/phone within reach Nurse Communication: Mobility status;Patient requests pain meds PT Visit Diagnosis: Unsteadiness on feet (R26.81);Other abnormalities of gait and mobility (R26.89);Muscle weakness (generalized) (M62.81)     Time: 7116-5790 PT Time Calculation (min) (ACUTE ONLY): 16 min  Charges:  $Gait Training: 8-22 mins                     Abran Richard, PT Acute Rehab Services Pager [phone] [facility] [place] 11/21/2019, 3:11 PM

## 2019-11-21 NOTE — Progress Notes (Signed)
Humble working. Came times times 2 to see pt for rounds , he was in dialysis. No problems other than pain from surgery . Medication for pain working. Plan ; SNF vs CIR

## 2019-11-21 NOTE — Progress Notes (Addendum)
  [facility] Progress Note   Subjective:  Seen on HD - 1.5L UF goal and tolerating. Some foot pain today. No CP/dyspnea. Being considered for CIR.  Objective Vitals:   11/20/19 1100 11/20/19 1218 11/20/19 2035 11/21/19 0500  BP: (!) 90/48 (!) 95/47 (!) 104/56 (!) 107/59  Pulse: 69 64 67 62  Resp: 16 17 16 15   Temp: 97.6 F (36.4 C) 98.2 F (36.8 C) 97.8 F (36.6 C) 98.1 F (36.7 C)  TempSrc:  Oral Oral Axillary  SpO2: 99% 99% 97% 95%  Weight: 75.8 kg     Height:       Physical Exam General: Well appearing man, NAD. Room air. Heart: RRR; no murmur Lungs: CTA anteriorly Abdomen: soft, non-tender Extremities: No LLE edema, R foot bandaged with wound vac Dialysis Access: L AVF + thrill  Additional Objective Labs: Basic Metabolic Panel: Recent Labs  Lab 11/19/19 0905 11/20/19 0721  NA 135 132*  K 4.0 6.2*  CL 93* 92*  CO2  --  25  GLUCOSE 94 172*  BUN 42* 53*  CREATININE 10.60* 11.84*  CALCIUM  --  8.9  PHOS  --  3.3   Liver Function Tests: Recent Labs  Lab 11/20/19 0721  ALBUMIN 3.1*   CBC: Recent Labs  Lab 11/19/19 0905 11/20/19 0721  WBC  --  10.3  HGB 14.6 13.0  HCT 43.0 41.5  MCV  --  93.7  PLT  --  181   CBG: Recent Labs  Lab 11/19/19 1146 11/19/19 2043 11/20/19 0638 11/20/19 2034 11/21/19 0640  GLUCAP 133* 263* 110* 214* 82   Medications: . sodium chloride 10 mL/hr at 11/19/19 0858  . sodium chloride Stopped (11/20/19 1303)   . aspirin EC  81 mg Oral Daily  . atorvastatin  40 mg Oral q1800  . calcium acetate  1,334 mg Oral BID WC  . Chlorhexidine Gluconate Cloth  6 each Topical Q0600  . clopidogrel  75 mg Oral Daily  . docusate sodium  100 mg Oral BID  . feeding supplement (NEPRO CARB STEADY)  237 mL Oral BID BM  . gabapentin  100 mg Oral BID  . lipase/protease/amylase  36,000 Units Oral Daily  . midodrine  5 mg Oral Q M,W,F  . multivitamin  1 tablet Oral QHS  . pantoprazole  40 mg Oral Daily    Dialysis  Orders: MWF @ [facility] 4hr, 450/800, EDW 71.5kg, 2K/2Ca, AVF, UFP #2, heparin 5000 - Parsabiv 2.83mcg IV q HD - Calcitriol 1.44mcg PO q HD  Assessment/Plan: 1. PAD/R toe osteomyelitis/ sp R TMA 11/19/19: Per [facility]. Wound vac in place. Being considered for CIR. 2. ESRD:Continue usual MWF schedule - HD today. 3. Hypotension/volume:BP low/stable - uses midodrine pre-HD. No edema. 4. Anemia:Hgb 13 - no ESA needed. 5. Metabolic bone disease:Ca/Phos good - continue home binders. 6. Nutrition: Alb low - continue Nepro protein supps. 7. T2DM 8. CAD  Veneta Penton, Hershal Coria 11/21/2019, 8:54 AM  [facility]  Pt seen, examined and agree w A/P as above.  Kelly Splinter  MD 11/21/2019, 11:13 AM

## 2019-11-21 NOTE — Plan of Care (Signed)

## 2019-11-22 LAB — GLUCOSE, CAPILLARY: Glucose-Capillary: 100 mg/dL — ABNORMAL HIGH (ref 70–99)

## 2019-11-22 NOTE — Progress Notes (Signed)
[facility] Progress Note   Subjective:   Patient seen in room. Foot pain comes and waves and causes some SOB. Otherwise no concerns, denies SOB at present, CP, palpitations, abdominal pain, N/V/D.   Objective Vitals:   11/21/19 1456 11/21/19 1944 11/22/19 0300 11/22/19 0736  BP: (!) 83/47 111/72 114/70 128/70  Pulse: 85 77 69 78  Resp: 17 17 16 16   Temp: 98.2 F (36.8 C) 97.8 F (36.6 C) 98.1 F (36.7 C) 98.1 F (36.7 C)  TempSrc: Oral Oral Oral Oral  SpO2: 95% 97% 96% 94%  Weight:      Height:       Physical Exam General: Well developed male, alert and in NAD Heart: RRR, no murmur, rubs or gallops Lungs: CTA bilaterally without wheezing, rhonchi or rales Abdomen: Soft, non-tender, non-distended. +BS Extremities: R foot wrapped, no edema LLE Dialysis Access: LUE AVF + bruit  Additional Objective Labs: Basic Metabolic Panel: Recent Labs  Lab 11/19/19 0905 11/20/19 0721 11/21/19 0943  NA 135 132* 137  K 4.0 6.2* 4.4  CL 93* 92* 97*  CO2  --  25 28  GLUCOSE 94 172* 133*  BUN 42* 53* 32*  CREATININE 10.60* 11.84* 8.08*  CALCIUM  --  8.9 8.5*  PHOS  --  3.3 4.8*   Liver Function Tests: Recent Labs  Lab 11/20/19 0721 11/21/19 0943  ALBUMIN 3.1* 2.9*   CBC: Recent Labs  Lab 11/19/19 0905 11/20/19 0721 11/21/19 0943  WBC  --  10.3 9.3  HGB 14.6 13.0 12.5*  HCT 43.0 41.5 40.3  MCV  --  93.7 94.4  PLT  --  181 164   Blood Culture    Component Value Date/Time   SDES BLOOD RIGHT ARM 07/24/2019 1240   SPECREQUEST  07/24/2019 1240    BOTTLES DRAWN AEROBIC AND ANAEROBIC Blood Culture adequate volume   CULT  07/24/2019 1240    NO GROWTH 5 DAYS Performed at [facility], [place] [address], [place], [place] [address]    REPTSTATUS 07/29/2019 FINAL 07/24/2019 1240    CBG: Recent Labs  Lab 11/20/19 0638 11/20/19 2034 11/21/19 0640 11/21/19 2019 11/22/19 0644  GLUCAP 110* 214* 82 167* 100*   Medications: . sodium chloride 10  mL/hr at 11/19/19 0858  . sodium chloride Stopped (11/20/19 1303)   . aspirin EC  81 mg Oral Daily  . atorvastatin  40 mg Oral q1800  . calcium acetate  1,334 mg Oral BID WC  . Chlorhexidine Gluconate Cloth  6 each Topical Q0600  . clopidogrel  75 mg Oral Daily  . docusate sodium  100 mg Oral BID  . feeding supplement (NEPRO CARB STEADY)  237 mL Oral BID BM  . gabapentin  100 mg Oral BID  . lipase/protease/amylase  36,000 Units Oral Daily  . midodrine  5 mg Oral Q M,W,F  . multivitamin  1 tablet Oral QHS  . pantoprazole  40 mg Oral Daily    Dialysis Orders: MWF @ [facility] 4hr, 450/800, EDW 71.5kg, 2K/2Ca, AVF, UFP #2, heparin 5000 - Parsabiv 2.59mcg IV q HD - Calcitriol 1.84mcg PO q HD  Assessment/Plan: 1. PAD/R toe osteomyelitis/ sp R TMA 11/19/19: Per [facility]. Wound vac in place.Being considered for CIR. 2. ESRD:Continue usual MWF schedule, next HD 8/16.  3. Hypotension/volume:BPlow/stable, improved today - uses midodrine pre-HD. No edema. 4. Anemia:Hgb>12, no ESA indicated. 5. Metabolic bone disease:Ca/Phosat goal- continue home binders. 6. Nutrition: Alb low - continue Nepro protein supps. 7. T2DM 8.  CAD  Anice Paganini, PA-C 11/22/2019, 11:14 AM  [facility] Pager: [phone]

## 2019-11-22 NOTE — Progress Notes (Signed)
   Subjective: 3 Days Post-Op Procedure(s) (LRB): RIGHT TRANSMETATARSAL AMPUTATION (Right) Patient reports pain as moderate and severe.    Objective: Vital signs in last 24 hours: Temp:  [97.8 F (36.6 C)-98.2 F (36.8 C)] 98.1 F (36.7 C) (08/14 0736) Pulse Rate:  [69-85] 78 (08/14 0736) Resp:  [16-17] 16 (08/14 0736) BP: (83-128)/(47-72) 128/70 (08/14 0736) SpO2:  [94 %-97 %] 94 % (08/14 0736) Weight:  [77.1 kg] 77.1 kg (08/13 1230)  Intake/Output from previous day: 08/13 0701 - 08/14 0700 In: 480 [P.O.:480] Out: 2000  Intake/Output this shift: Total I/O In: 240 [P.O.:240] Out: -   Recent Labs    11/20/19 0721 11/21/19 0943  HGB 13.0 12.5*   Recent Labs    11/20/19 0721 11/21/19 0943  WBC 10.3 9.3  RBC 4.43 4.27  HCT 41.5 40.3  PLT 181 164   Recent Labs    11/20/19 0721 11/21/19 0943  NA 132* 137  K 6.2* 4.4  CL 92* 97*  CO2 25 28  BUN 53* 32*  CREATININE 11.84* 8.08*  GLUCOSE 172* 133*  CALCIUM 8.9 8.5*   No results for input(s): LABPT, INR in the last 72 hours.  VAC seal good.  No results found.  Assessment/Plan: 3 Days Post-Op Procedure(s) (LRB): RIGHT TRANSMETATARSAL AMPUTATION (Right) Plan  Continue ABX  Jon Anderson 11/22/2019, 10:53 AM

## 2019-11-23 LAB — GLUCOSE, CAPILLARY: Glucose-Capillary: 165 mg/dL — ABNORMAL HIGH (ref 70–99)

## 2019-11-23 MED ORDER — POLYETHYLENE GLYCOL 3350 17 G PO PACK
17.0000 g | PACK | Freq: Every day | ORAL | Status: DC
  Administered 2019-11-23 – 2019-11-25 (×2): 17 g via ORAL
  Filled 2019-11-23 (×2): qty 1

## 2019-11-23 MED ORDER — CALCITRIOL 0.5 MCG PO CAPS
1.2500 ug | ORAL_CAPSULE | ORAL | Status: DC
  Administered 2019-11-24: 1.25 ug via ORAL

## 2019-11-23 MED ORDER — NALDEMEDINE TOSYLATE 0.2 MG PO TABS
0.2000 mg | ORAL_TABLET | Freq: Every day | ORAL | Status: DC | PRN
  Filled 2019-11-23: qty 1

## 2019-11-23 MED ORDER — NALDEMEDINE TOSYLATE 0.2 MG PO TABS
0.2000 mg | ORAL_TABLET | Freq: Every day | ORAL | Status: DC | PRN
  Administered 2019-11-23 – 2019-11-24 (×2): 0.2 mg via ORAL
  Filled 2019-11-23 (×2): qty 1

## 2019-11-23 MED ORDER — CHLORHEXIDINE GLUCONATE CLOTH 2 % EX PADS
6.0000 | MEDICATED_PAD | Freq: Every day | CUTANEOUS | Status: DC
  Administered 2019-11-23 – 2019-11-24 (×2): 6 via TOPICAL

## 2019-11-23 NOTE — Progress Notes (Signed)
   Subjective: 4 Days Post-Op Procedure(s) (LRB): RIGHT TRANSMETATARSAL AMPUTATION (Right) Patient reports pain as moderate and severe.  Does not elevate foot when pain is bad. Less pain when elevated but he keeps lowering foot and C/O increased pain.   Objective: Vital signs in last 24 hours: Temp:  [98.2 F (36.8 C)-98.4 F (36.9 C)] 98.2 F (36.8 C) (08/15 0949) Pulse Rate:  [71-91] 81 (08/15 0949) Resp:  [15-18] 15 (08/15 0949) BP: (133-166)/(62-80) 133/62 (08/15 0949) SpO2:  [96 %-97 %] 96 % (08/15 0949)  Intake/Output from previous day: 08/14 0701 - 08/15 0700 In: 600 [P.O.:600] Out: -  Intake/Output this shift: No intake/output data recorded.  Recent Labs    11/21/19 0943  HGB 12.5*   Recent Labs    11/21/19 0943  WBC 9.3  RBC 4.27  HCT 40.3  PLT 164   Recent Labs    11/21/19 0943  NA 137  K 4.4  CL 97*  CO2 28  BUN 32*  CREATININE 8.08*  GLUCOSE 133*  CALCIUM 8.5*   No results for input(s): LABPT, INR in the last 72 hours.  VAC seal good No results found.  Assessment/Plan: 4 Days Post-Op Procedure(s) (LRB): RIGHT TRANSMETATARSAL AMPUTATION (Right) Continue ABX and VAC.      Marybelle Killings 11/23/2019, 10:55 AM

## 2019-11-23 NOTE — Progress Notes (Signed)
[facility] Progress Note   Subjective:   Patient seen in room. Reports foot is throbbing. No other concerns. Denies SOB, CP, palpitations, dizziness, abdominal pain, N/V.   Objective Vitals:   11/22/19 0736 11/22/19 1942 11/23/19 0413 11/23/19 0949  BP: 128/70 (!) 166/76 (!) 154/80 133/62  Pulse: 78 71 91 81  Resp: 16 17 18 15   Temp: 98.1 F (36.7 C) 98.4 F (36.9 C) 98.3 F (36.8 C) 98.2 F (36.8 C)  TempSrc: Oral Oral Oral Oral  SpO2: 94% 96% 97% 96%  Weight:      Height:       Physical Exam General: Well developed male, alert and in NAD Heart: RRR, no murmur, rubs or gallops Lungs: CTA bilaterally without wheezing, rhonchi or rales Abdomen: Soft, non-tender, non-distended. +BS Extremities: R foot wrapped, trace LLE edema Dialysis Access: LUE AVF + bruit  Additional Objective Labs: Basic Metabolic Panel: Recent Labs  Lab 11/19/19 0905 11/20/19 0721 11/21/19 0943  NA 135 132* 137  K 4.0 6.2* 4.4  CL 93* 92* 97*  CO2  --  25 28  GLUCOSE 94 172* 133*  BUN 42* 53* 32*  CREATININE 10.60* 11.84* 8.08*  CALCIUM  --  8.9 8.5*  PHOS  --  3.3 4.8*   Liver Function Tests: Recent Labs  Lab 11/20/19 0721 11/21/19 0943  ALBUMIN 3.1* 2.9*   CBC: Recent Labs  Lab 11/19/19 0905 11/20/19 0721 11/21/19 0943  WBC  --  10.3 9.3  HGB 14.6 13.0 12.5*  HCT 43.0 41.5 40.3  MCV  --  93.7 94.4  PLT  --  181 164   Blood Culture    Component Value Date/Time   SDES BLOOD RIGHT ARM 07/24/2019 1240   SPECREQUEST  07/24/2019 1240    BOTTLES DRAWN AEROBIC AND ANAEROBIC Blood Culture adequate volume   CULT  07/24/2019 1240    NO GROWTH 5 DAYS Performed at [facility], [place] [address] [place], [place] [address]    REPTSTATUS 07/29/2019 FINAL 07/24/2019 1240   CBG: Recent Labs  Lab 11/20/19 0638 11/20/19 2034 11/21/19 0640 11/21/19 2019 11/22/19 0644  GLUCAP 110* 214* 82 167* 100*   Medications: . sodium chloride 10 mL/hr at 11/19/19 0858   . sodium chloride Stopped (11/20/19 1303)   . aspirin EC  81 mg Oral Daily  . atorvastatin  40 mg Oral q1800  . calcium acetate  1,334 mg Oral BID WC  . Chlorhexidine Gluconate Cloth  6 each Topical Q0600  . clopidogrel  75 mg Oral Daily  . docusate sodium  100 mg Oral BID  . feeding supplement (NEPRO CARB STEADY)  237 mL Oral BID BM  . gabapentin  100 mg Oral BID  . lipase/protease/amylase  36,000 Units Oral Daily  . midodrine  5 mg Oral Q M,W,F  . multivitamin  1 tablet Oral QHS  . pantoprazole  40 mg Oral Daily    Dialysis Orders: MWF @ [facility] 4hr, 450/800, EDW 71.5kg, 2K/2Ca, AVF, UFP #2, heparin 5000 - Parsabiv 2.48mcg IV q HD - Calcitriol 1.21mcg PO q HD  Assessment/Plan: 1. 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 Wound vac in place.Being considered for CIR. 2. ESRD:Continue usual MWF schedule, next HD 8/16.  3. Hypotension/volume:BPlow/stable on admission, improved today (actually slightly high) - uses midodrine pre-HD. Suspect weights here are incorrect, will attempt to get standing weight after dialysis tomorrow. 4. Anemia:Hgb>12, no ESA indicated. 5. Metabolic bone disease:Ca/Phosat goal- continue home binders and calcitriol.  Parasbiv not on formulary, follow calcium 6. Nutrition: Alb low -continueNepro protein supps. 7. T2DM 8. CAD  Anice Paganini, PA-C 11/23/2019, 10:26 AM  [facility] Pager: [phone]

## 2019-11-23 NOTE — Progress Notes (Signed)
Patient's penis noted slightly swollen. States that makes it difficult for him to void.

## 2019-11-23 NOTE — Plan of Care (Signed)

## 2019-11-23 NOTE — Plan of Care (Signed)
  Problem: Pain Managment: Goal: General experience of comfort will improve Outcome: Progressing   Problem: Safety: Goal: Ability to remain free from injury will improve Outcome: Progressing   Problem: Skin Integrity: Goal: Risk for impaired skin integrity will decrease Outcome: Progressing   

## 2019-11-24 LAB — RENAL FUNCTION PANEL
Albumin: 2.9 g/dL — ABNORMAL LOW (ref 3.5–5.0)
Anion gap: 16 — ABNORMAL HIGH (ref 5–15)
BUN: 58 mg/dL — ABNORMAL HIGH (ref 8–23)
CO2: 28 mmol/L (ref 22–32)
Calcium: 8.8 mg/dL — ABNORMAL LOW (ref 8.9–10.3)
Chloride: 92 mmol/L — ABNORMAL LOW (ref 98–111)
Creatinine, Ser: 10.75 mg/dL — ABNORMAL HIGH (ref 0.61–1.24)
GFR calc Af Amer: 5 mL/min — ABNORMAL LOW (ref >60)
GFR calc non Af Amer: 4 mL/min — ABNORMAL LOW (ref >60)
Glucose, Bld: 94 mg/dL (ref 70–99)
Phosphorus: 5.5 mg/dL — ABNORMAL HIGH (ref 2.5–4.6)
Potassium: 5.3 mmol/L — ABNORMAL HIGH (ref 3.5–5.1)
Sodium: 136 mmol/L (ref 135–145)

## 2019-11-24 LAB — CBC
HCT: 38.8 % — ABNORMAL LOW (ref 39.0–52.0)
Hemoglobin: 12.4 g/dL — ABNORMAL LOW (ref 13.0–17.0)
MCH: 30.1 pg (ref 26.0–34.0)
MCHC: 32 g/dL (ref 30.0–36.0)
MCV: 94.2 fL (ref 80.0–100.0)
Platelets: 168 10*3/uL (ref 150–400)
RBC: 4.12 MIL/uL — ABNORMAL LOW (ref 4.22–5.81)
RDW: 14.9 % (ref 11.5–15.5)
WBC: 9.1 10*3/uL (ref 4.0–10.5)
nRBC: 0 % (ref 0.0–0.2)

## 2019-11-24 LAB — GLUCOSE, CAPILLARY
Glucose-Capillary: 93 mg/dL (ref 70–99)
Glucose-Capillary: 215 mg/dL — ABNORMAL HIGH (ref 70–99)

## 2019-11-24 MED ORDER — CALCITRIOL 0.25 MCG PO CAPS
ORAL_CAPSULE | ORAL | Status: AC
  Filled 2019-11-24: qty 1

## 2019-11-24 MED ORDER — CALCITRIOL 0.5 MCG PO CAPS
ORAL_CAPSULE | ORAL | Status: AC
  Filled 2019-11-24: qty 2

## 2019-11-24 NOTE — Progress Notes (Signed)
[facility] Progress Note   Subjective:   Patient seen on dialysis, no complaints.  Objective Vitals:   11/23/19 0949 11/23/19 1355 11/23/19 1953 11/24/19 0435  BP: 133/62 140/90 (!) 151/95 126/78  Pulse: 81 90 98 87  Resp: 15 17 15 17   Temp: 98.2 F (36.8 C) 98.5 F (36.9 C) 99.2 F (37.3 C) 98.7 F (37.1 C)  TempSrc: Oral Oral Oral Oral  SpO2: 96% 100% 98% 95%  Weight:      Height:       Physical Exam General: Well developed male, alert and in NAD Heart: RRR, no murmur, rubs or gallops Lungs: CTA bilaterally without wheezing, rhonchi or rales Abdomen: Soft, non-tender, non-distended. +BS Extremities: R foot wrapped, trace LLE edema Dialysis Access: LUE AVF + bruit  Additional Objective Labs: Basic Metabolic Panel: Recent Labs  Lab 11/19/19 0905 11/20/19 0721 11/21/19 0943  NA 135 132* 137  K 4.0 6.2* 4.4  CL 93* 92* 97*  CO2  --  25 28  GLUCOSE 94 172* 133*  BUN 42* 53* 32*  CREATININE 10.60* 11.84* 8.08*  CALCIUM  --  8.9 8.5*  PHOS  --  3.3 4.8*   Liver Function Tests: Recent Labs  Lab 11/20/19 0721 11/21/19 0943  ALBUMIN 3.1* 2.9*   CBC: Recent Labs  Lab 11/19/19 0905 11/20/19 0721 11/21/19 0943  WBC  --  10.3 9.3  HGB 14.6 13.0 12.5*  HCT 43.0 41.5 40.3  MCV  --  93.7 94.4  PLT  --  181 164   Blood Culture    Component Value Date/Time   SDES BLOOD RIGHT ARM 07/24/2019 1240   SPECREQUEST  07/24/2019 1240    BOTTLES DRAWN AEROBIC AND ANAEROBIC Blood Culture adequate volume   CULT  07/24/2019 1240    NO GROWTH 5 DAYS Performed at [facility], [place] [address] [place], [place] [address]    REPTSTATUS 07/29/2019 FINAL 07/24/2019 1240   CBG: Recent Labs  Lab 11/21/19 0640 11/21/19 2019 11/22/19 0644 11/23/19 2022 11/24/19 0642  GLUCAP 82 167* 100* 165* 93   Medications: . sodium chloride 10 mL/hr at 11/19/19 0858  . sodium chloride Stopped (11/20/19 1303)   . aspirin EC  81 mg Oral Daily  .  atorvastatin  40 mg Oral q1800  . calcitRIOL  1.25 mcg Oral Q M,W,F-HD  . calcium acetate  1,334 mg Oral BID WC  . Chlorhexidine Gluconate Cloth  6 each Topical Q0600  . Chlorhexidine Gluconate Cloth  6 each Topical Q0600  . clopidogrel  75 mg Oral Daily  . docusate sodium  100 mg Oral BID  . feeding supplement (NEPRO CARB STEADY)  237 mL Oral BID BM  . gabapentin  100 mg Oral BID  . lipase/protease/amylase  36,000 Units Oral Daily  . midodrine  5 mg Oral Q M,W,F  . multivitamin  1 tablet Oral QHS  . pantoprazole  40 mg Oral Daily  . polyethylene glycol  17 g Oral Daily    Dialysis Orders: MWF @ [facility] 4hr, 450/800, EDW 71.5kg, 2K/2Ca, AVF, UFP #2, heparin 5000 - Parsabiv 2.81mcg IV q HD - Calcitriol 1.71mcg PO q HD  Assessment/Plan: 1. 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 Wound vac in place.Being considered for CIR. 2. ESRD:Continue usual MWF schedule. UF goal today 3L 3. Hypotension/volume:BPlow/stable on admission, improved today (actually slightly high) - uses midodrine pre-HD. Suspect weights here are incorrect, will attempt to get standing weight after dialysis 4. Anemia:Hgb>12, no  ESA indicated. 5. Metabolic bone disease:Ca/Phosat goal- continue home binders and calcitriol. Parasbiv not on formulary, follow calcium, labs pending 6. Nutrition: Alb low -continueNepro protein supps. 7. T2DM 8. CAD  Gean Quint, MD [facility]

## 2019-11-24 NOTE — Procedures (Signed)
I was present at this dialysis session. I have reviewed the session itself and made appropriate changes.   Filed Weights   11/20/19 1100 11/21/19 0830 11/21/19 1230  Weight: 75.8 kg 79.2 kg 77.1 kg    Recent Labs  Lab 11/21/19 0943  NA 137  K 4.4  CL 97*  CO2 28  GLUCOSE 133*  BUN 32*  CREATININE 8.08*  CALCIUM 8.5*  PHOS 4.8*    Recent Labs  Lab 11/19/19 0905 11/20/19 0721 11/21/19 0943  WBC  --  10.3 9.3  HGB 14.6 13.0 12.5*  HCT 43.0 41.5 40.3  MCV  --  93.7 94.4  PLT  --  181 164    Scheduled Meds: . aspirin EC  81 mg Oral Daily  . atorvastatin  40 mg Oral q1800  . calcitRIOL  1.25 mcg Oral Q M,W,F-HD  . calcium acetate  1,334 mg Oral BID WC  . Chlorhexidine Gluconate Cloth  6 each Topical Q0600  . Chlorhexidine Gluconate Cloth  6 each Topical Q0600  . clopidogrel  75 mg Oral Daily  . docusate sodium  100 mg Oral BID  . feeding supplement (NEPRO CARB STEADY)  237 mL Oral BID BM  . gabapentin  100 mg Oral BID  . lipase/protease/amylase  36,000 Units Oral Daily  . midodrine  5 mg Oral Q M,W,F  . multivitamin  1 tablet Oral QHS  . pantoprazole  40 mg Oral Daily  . polyethylene glycol  17 g Oral Daily   Continuous Infusions: . sodium chloride 10 mL/hr at 11/19/19 0858  . sodium chloride Stopped (11/20/19 1303)   PRN Meds:.acetaminophen, HYDROmorphone (DILAUDID) injection, Naldemedine Tosylate, nitroGLYCERIN, ondansetron **OR** ondansetron (ZOFRAN) IV, oxyCODONE   Gean Quint, MD [facility] 11/24/2019, 8:28 AM

## 2019-11-24 NOTE — Plan of Care (Signed)

## 2019-11-24 NOTE — Progress Notes (Signed)
Patient is postop day 5 status post transmetatarsal amputation.  He is currently in hemodialysis.  He is without complaints.  Vital signs stable afebrile wound VAC is currently functioning.  Status post above.  We will see CIR rehab disposition today.  If not we will consider transfer to nursing facility.  Discharge next 1 to 2 days

## 2019-11-24 NOTE — Progress Notes (Signed)
OT Cancellation Note  Patient Details Name: Jon Anderson MRN: [id number] DOB: 07-15-1950   Cancelled Treatment:    Reason Eval/Treat Not Completed: Patient at procedure or test/ unavailable (currently in HD, will try back)  [name] 11/24/2019, 8:08 AM  Nestor Lewandowsky, OTR/L [place] Pager: [phone] Office: [phone]

## 2019-11-24 NOTE — Progress Notes (Signed)
Physical Therapy Treatment Patient Details Name: Jon Anderson MRN: [id number] DOB: 1950-11-17 Today's Date: 11/24/2019    History of Present Illness Pt is a 69 year old man admitted with chronic, non-healing wounds of R toes warranting transmetatarsal amputation.  PMH: ESRD, CAD, DM, HTN, PAD, chronic anemia.    PT Comments    Pt reclined in chair on arrival. He continues to require min assistance and fatigues with increased activity but very motivated. Continue to recommend CIR to maximize functional gains before returning home.    Follow Up Recommendations  CIR     Equipment Recommendations  Rolling walker with 5" wheels;3in1 (PT)    Recommendations for Other Services Rehab consult     Precautions / Restrictions Precautions Precautions: Fall Precaution Comments: wound vac, R foot Required Braces or Orthoses: Other Brace Other Brace: post op shoe Restrictions Weight Bearing Restrictions: Yes RLE Weight Bearing: Touchdown weight bearing Other Position/Activity Restrictions: wound vac    Mobility  Bed Mobility               General bed mobility comments: Pt in recliner on arrival.  Transfers Overall transfer level: Needs assistance Equipment used: Rolling walker (2 wheeled) Transfers: Sit to/from Stand Sit to Stand: Min assist         General transfer comment: Cues for hand placement to and from seated surface.  Good adherence to weight bearing and followed commands for foot placement.  Ambulation/Gait Ambulation/Gait assistance: Min assist Gait Distance (Feet): 20 Feet Assistive device: Rolling walker (2 wheeled) Gait Pattern/deviations: Step-to pattern;Antalgic;Trunk flexed Gait velocity: decreased   General Gait Details: Pt performed hop to pattern with NWB status despite order for TWB.  Min assistance for safety and steadying.  He began to fatigue at end of trial.   Stairs             Wheelchair Mobility    Modified Rankin (Stroke  Patients Only)       Balance Overall balance assessment: Needs assistance Sitting-balance support: No upper extremity supported Sitting balance-Leahy Scale: Good       Standing balance-Leahy Scale: Poor                              Cognition Arousal/Alertness: Awake/alert Behavior During Therapy: WFL for tasks assessed/performed Overall Cognitive Status: Within Functional Limits for tasks assessed                                        Exercises General Exercises - Lower Extremity Ankle Circles/Pumps: AROM;Both;Seated;10 reps Quad Sets: AROM;Both;10 reps;Supine Heel Slides: AROM;Both;10 reps;Supine;AAROM Hip ABduction/ADduction: AROM;Both;10 reps;Supine Straight Leg Raises: AROM;Both;10 reps;Supine    General Comments        Pertinent Vitals/Pain Pain Assessment: Faces Faces Pain Scale: Hurts little more Pain Location: R foot Pain Descriptors / Indicators: Throbbing Pain Intervention(s): Monitored during session;Repositioned    Home Living                      Prior Function            PT Goals (current goals can now be found in the care plan section) Acute Rehab PT Goals Patient Stated Goal: to not be a burden to his family - open to rehab at d/c Potential to Achieve Goals: Good Progress towards PT goals: Progressing toward goals  Frequency    Min 4X/week      PT Plan Current plan remains appropriate    Co-evaluation              AM-PAC PT "6 Clicks" Mobility   Outcome Measure  Help needed turning from your back to your side while in a flat bed without using bedrails?: None Help needed moving from lying on your back to sitting on the side of a flat bed without using bedrails?: None Help needed moving to and from a bed to a chair (including a wheelchair)?: A Little Help needed standing up from a chair using your arms (e.g., wheelchair or bedside chair)?: A Little Help needed to walk in hospital room?:  A Little Help needed climbing 3-5 steps with a railing? : A Lot 6 Click Score: 19    End of Session Equipment Utilized During Treatment: Gait belt Activity Tolerance: Patient tolerated treatment well Patient left: with chair alarm set;in chair;with call bell/phone within reach Nurse Communication: Mobility status;Patient requests pain meds PT Visit Diagnosis: Unsteadiness on feet (R26.81);Other abnormalities of gait and mobility (R26.89);Muscle weakness (generalized) (M62.81)     Time: 8118-8677 PT Time Calculation (min) (ACUTE ONLY): 20 min  Charges:  $Gait Training: 8-22 mins                     Jon Anderson , PTA Acute Rehabilitation Services Pager [phone] Office [phone]     Jon Anderson 11/24/2019, 6:23 PM

## 2019-11-24 NOTE — Plan of Care (Signed)
  Problem: Pain Managment: Goal: General experience of comfort will improve Outcome: Progressing   Problem: Safety: Goal: Ability to remain free from injury will improve Outcome: Progressing   Problem: Skin Integrity: Goal: Risk for impaired skin integrity will decrease Outcome: Progressing   

## 2019-11-24 NOTE — Progress Notes (Signed)
Occupational Therapy Treatment Patient Details Name: Jon Anderson MRN: [id number] DOB: 17-Feb-1951 Today's Date: 11/24/2019    History of present illness Pt is a 69 year old man admitted with chronic, non-healing wounds of R toes warranting transmetatarsal amputation.  PMH: ESRD, CAD, DM, HTN, PAD, chronic anemia.   OT comments  Pt ambulated into bathroom and used 3 in 1 over toilet to attempt to have BM with min assist and increased time. Pt completed grooming in sitting once returned to his chair. Pt is eager to go to rehab.   Follow Up Recommendations  CIR    Equipment Recommendations  Tub/shower bench    Recommendations for Other Services      Precautions / Restrictions Precautions Precautions: Fall Precaution Comments: wound vac, R foot Required Braces or Orthoses: Other Brace Other Brace: post op shoe Restrictions Weight Bearing Restrictions: Yes RLE Weight Bearing: Non weight bearing       Mobility Bed Mobility               General bed mobility comments: pt received in bed  Transfers Overall transfer level: Needs assistance Equipment used: Rolling walker (2 wheeled) Transfers: Sit to/from Stand Sit to Stand: Min assist         General transfer comment: assist to steady as he moved hands from 3 in 1 to RW    Balance Overall balance assessment: Needs assistance Sitting-balance support: No upper extremity supported Sitting balance-Leahy Scale: Good       Standing balance-Leahy Scale: Poor                             ADL either performed or assessed with clinical judgement   ADL Overall ADL's : Needs assistance/impaired     Grooming: Wash/dry hands;Wash/dry face;Sitting;Set up                   Toilet Transfer: Minimal assistance;Ambulation;BSC;RW Toilet Transfer Details (indicate cue type and reason): ambulated to bathroom Toileting- Clothing Manipulation and Hygiene: Set up;Sitting/lateral lean       Functional  mobility during ADLs: Minimal assistance;Rolling walker       Vision       Perception     Praxis      Cognition Arousal/Alertness: Awake/alert Behavior During Therapy: WFL for tasks assessed/performed Overall Cognitive Status: Within Functional Limits for tasks assessed                                          Exercises     Shoulder Instructions       General Comments      Pertinent Vitals/ Pain       Pain Assessment: Faces Faces Pain Scale: Hurts little more Pain Location: R foot Pain Descriptors / Indicators: Throbbing Pain Intervention(s): Monitored during session;Repositioned  Home Living                                          Prior Functioning/Environment              Frequency  Min 2X/week        Progress Toward Goals  OT Goals(current goals can now be found in the care plan section)  Progress towards OT goals: Progressing toward goals  Acute Rehab OT Goals Patient Stated Goal: to not be a burden to his family - open to rehab at d/c OT Goal Formulation: With patient Time For Goal Achievement: 12/04/19 Potential to Achieve Goals: Good  Plan Discharge plan remains appropriate    Co-evaluation                 AM-PAC OT "6 Clicks" Daily Activity     Outcome Measure   Help from another person eating meals?: None Help from another person taking care of personal grooming?: None Help from another person toileting, which includes using toliet, bedpan, or urinal?: A Little Help from another person bathing (including washing, rinsing, drying)?: A Lot Help from another person to put on and taking off regular upper body clothing?: A Little Help from another person to put on and taking off regular lower body clothing?: A Lot 6 Click Score: 18    End of Session Equipment Utilized During Treatment: Gait belt;Rolling walker  OT Visit Diagnosis: Unsteadiness on feet (R26.81);Other abnormalities of gait and  mobility (R26.89);Muscle weakness (generalized) (M62.81)   Activity Tolerance Patient tolerated treatment well   Patient Left in chair;with call bell/phone within reach;with chair alarm set   Nurse Communication          Time: 7340-3709 OT Time Calculation (min): 16 min  Charges: OT General Charges $OT Visit: 1 Visit OT Treatments $Self Care/Home Management : 8-22 mins  Nestor Lewandowsky, OTR/L Acute Rehabilitation Services Pager: [phone] Office: [phone]   Jon Anderson 11/24/2019, 2:31 PM

## 2019-11-25 ENCOUNTER — Other Ambulatory Visit: Payer: Self-pay

## 2019-11-25 ENCOUNTER — Inpatient Hospital Stay (HOSPITAL_COMMUNITY)
Admission: RE | Admit: 2019-11-25 | Discharge: 2019-12-02 | DRG: 559 | Disposition: A | Discharge status: Home Health | Payer: Medicare Other | Source: Intra-hospital | Attending: Physical Medicine & Rehabilitation | Admitting: Physical Medicine & Rehabilitation

## 2019-11-25 ENCOUNTER — Encounter (HOSPITAL_COMMUNITY): Payer: Self-pay | Admitting: Physical Medicine & Rehabilitation

## 2019-11-25 DIAGNOSIS — R34 Anuria and oliguria: Secondary | ICD-10-CM | POA: Diagnosis present

## 2019-11-25 DIAGNOSIS — K219 Gastro-esophageal reflux disease without esophagitis: Secondary | ICD-10-CM | POA: Diagnosis present

## 2019-11-25 DIAGNOSIS — Z87891 Personal history of nicotine dependence: Secondary | ICD-10-CM

## 2019-11-25 DIAGNOSIS — I120 Hypertensive chronic kidney disease with stage 5 chronic kidney disease or end stage renal disease: Secondary | ICD-10-CM | POA: Diagnosis present

## 2019-11-25 DIAGNOSIS — Z4781 Encounter for orthopedic aftercare following surgical amputation: Principal | ICD-10-CM

## 2019-11-25 DIAGNOSIS — Z992 Dependence on renal dialysis: Secondary | ICD-10-CM

## 2019-11-25 DIAGNOSIS — Z7902 Long term (current) use of antithrombotics/antiplatelets: Secondary | ICD-10-CM

## 2019-11-25 DIAGNOSIS — E1151 Type 2 diabetes mellitus with diabetic peripheral angiopathy without gangrene: Secondary | ICD-10-CM | POA: Diagnosis present

## 2019-11-25 DIAGNOSIS — Z7982 Long term (current) use of aspirin: Secondary | ICD-10-CM | POA: Diagnosis not present

## 2019-11-25 DIAGNOSIS — E1122 Type 2 diabetes mellitus with diabetic chronic kidney disease: Secondary | ICD-10-CM | POA: Diagnosis present

## 2019-11-25 DIAGNOSIS — I959 Hypotension, unspecified: Secondary | ICD-10-CM | POA: Diagnosis present

## 2019-11-25 DIAGNOSIS — K5909 Other constipation: Secondary | ICD-10-CM | POA: Diagnosis present

## 2019-11-25 DIAGNOSIS — I739 Peripheral vascular disease, unspecified: Secondary | ICD-10-CM | POA: Diagnosis not present

## 2019-11-25 DIAGNOSIS — Z8249 Family history of ischemic heart disease and other diseases of the circulatory system: Secondary | ICD-10-CM

## 2019-11-25 DIAGNOSIS — D631 Anemia in chronic kidney disease: Secondary | ICD-10-CM | POA: Diagnosis present

## 2019-11-25 DIAGNOSIS — Z9861 Coronary angioplasty status: Secondary | ICD-10-CM | POA: Diagnosis not present

## 2019-11-25 DIAGNOSIS — E119 Type 2 diabetes mellitus without complications: Secondary | ICD-10-CM | POA: Diagnosis not present

## 2019-11-25 DIAGNOSIS — Z951 Presence of aortocoronary bypass graft: Secondary | ICD-10-CM | POA: Diagnosis not present

## 2019-11-25 DIAGNOSIS — I2510 Atherosclerotic heart disease of native coronary artery without angina pectoris: Secondary | ICD-10-CM | POA: Diagnosis present

## 2019-11-25 DIAGNOSIS — Z833 Family history of diabetes mellitus: Secondary | ICD-10-CM | POA: Diagnosis not present

## 2019-11-25 DIAGNOSIS — E785 Hyperlipidemia, unspecified: Secondary | ICD-10-CM | POA: Diagnosis present

## 2019-11-25 DIAGNOSIS — Z79899 Other long term (current) drug therapy: Secondary | ICD-10-CM | POA: Diagnosis not present

## 2019-11-25 DIAGNOSIS — E8889 Other specified metabolic disorders: Secondary | ICD-10-CM | POA: Diagnosis present

## 2019-11-25 DIAGNOSIS — N186 End stage renal disease: Secondary | ICD-10-CM | POA: Diagnosis present

## 2019-11-25 DIAGNOSIS — Z89431 Acquired absence of right foot: Secondary | ICD-10-CM | POA: Diagnosis not present

## 2019-11-25 DIAGNOSIS — Z89511 Acquired absence of right leg below knee: Secondary | ICD-10-CM | POA: Diagnosis present

## 2019-11-25 LAB — GLUCOSE, CAPILLARY: Glucose-Capillary: 141 mg/dL — ABNORMAL HIGH (ref 70–99)

## 2019-11-25 MED ORDER — SENNA 8.6 MG PO TABS
1.0000 | ORAL_TABLET | Freq: Two times a day (BID) | ORAL | Status: DC
  Administered 2019-11-25 – 2019-11-26 (×3): 8.6 mg via ORAL
  Filled 2019-11-25 (×3): qty 1

## 2019-11-25 MED ORDER — NALDEMEDINE TOSYLATE 0.2 MG PO TABS
0.2000 mg | ORAL_TABLET | Freq: Every day | ORAL | Status: DC | PRN
  Administered 2019-11-30: 0.2 mg via ORAL
  Filled 2019-11-25 (×2): qty 1

## 2019-11-25 MED ORDER — DOCUSATE SODIUM 100 MG PO CAPS
100.0000 mg | ORAL_CAPSULE | Freq: Two times a day (BID) | ORAL | Status: DC
  Administered 2019-11-25 – 2019-11-26 (×3): 100 mg via ORAL
  Filled 2019-11-25 (×3): qty 1

## 2019-11-25 MED ORDER — OXYCODONE HCL 5 MG PO TABS
5.0000 mg | ORAL_TABLET | ORAL | Status: DC | PRN
  Administered 2019-11-25 – 2019-11-28 (×9): 10 mg via ORAL
  Administered 2019-11-29: 5 mg via ORAL
  Administered 2019-11-29 (×2): 10 mg via ORAL
  Administered 2019-11-30: 5 mg via ORAL
  Administered 2019-11-30: 10 mg via ORAL
  Administered 2019-11-30: 5 mg via ORAL
  Administered 2019-12-01 – 2019-12-02 (×3): 10 mg via ORAL
  Filled 2019-11-25: qty 1
  Filled 2019-11-25 (×3): qty 2
  Filled 2019-11-25: qty 1
  Filled 2019-11-25 (×10): qty 2
  Filled 2019-11-25: qty 1
  Filled 2019-11-25 (×3): qty 2

## 2019-11-25 MED ORDER — PANTOPRAZOLE SODIUM 40 MG PO TBEC
40.0000 mg | DELAYED_RELEASE_TABLET | Freq: Every day | ORAL | Status: DC
  Administered 2019-11-26 – 2019-12-02 (×7): 40 mg via ORAL
  Filled 2019-11-25 (×7): qty 1

## 2019-11-25 MED ORDER — RENA-VITE PO TABS
1.0000 | ORAL_TABLET | Freq: Every day | ORAL | Status: DC
  Administered 2019-11-25 – 2019-12-01 (×7): 1 via ORAL
  Filled 2019-11-25 (×7): qty 1

## 2019-11-25 MED ORDER — CALCIUM ACETATE (PHOS BINDER) 667 MG PO CAPS
1334.0000 mg | ORAL_CAPSULE | Freq: Two times a day (BID) | ORAL | Status: DC
  Administered 2019-11-25 – 2019-12-02 (×14): 1334 mg via ORAL
  Filled 2019-11-25 (×14): qty 2

## 2019-11-25 MED ORDER — CALCITRIOL 0.25 MCG PO CAPS
1.2500 ug | ORAL_CAPSULE | ORAL | Status: DC
  Administered 2019-11-26 – 2019-12-01 (×3): 1.25 ug via ORAL
  Filled 2019-11-25 (×5): qty 5

## 2019-11-25 MED ORDER — MIDODRINE HCL 5 MG PO TABS
5.0000 mg | ORAL_TABLET | ORAL | Status: DC
  Administered 2019-11-26 – 2019-12-01 (×3): 5 mg via ORAL
  Filled 2019-11-25 (×4): qty 1

## 2019-11-25 MED ORDER — CLOPIDOGREL BISULFATE 75 MG PO TABS
75.0000 mg | ORAL_TABLET | Freq: Every day | ORAL | Status: DC
  Administered 2019-11-26 – 2019-12-02 (×7): 75 mg via ORAL
  Filled 2019-11-25 (×7): qty 1

## 2019-11-25 MED ORDER — SORBITOL 70 % SOLN
30.0000 mL | Freq: Once | Status: AC
  Administered 2019-11-25: 30 mL via ORAL
  Filled 2019-11-25: qty 30

## 2019-11-25 MED ORDER — ACETAMINOPHEN 325 MG PO TABS
325.0000 mg | ORAL_TABLET | Freq: Four times a day (QID) | ORAL | Status: DC | PRN
  Administered 2019-11-26: 650 mg via ORAL
  Filled 2019-11-25 (×2): qty 2

## 2019-11-25 MED ORDER — CHLORHEXIDINE GLUCONATE CLOTH 2 % EX PADS
6.0000 | MEDICATED_PAD | Freq: Every day | CUTANEOUS | Status: DC
  Administered 2019-11-26: 6 via TOPICAL

## 2019-11-25 MED ORDER — ONDANSETRON HCL 4 MG PO TABS: 4.0000 mg | ORAL_TABLET | Freq: Four times a day (QID) | ORAL | Status: DC | PRN

## 2019-11-25 MED ORDER — SORBITOL 70 % SOLN: 30.0000 mL | Freq: Every day | Status: DC | PRN

## 2019-11-25 MED ORDER — GABAPENTIN 100 MG PO CAPS
100.0000 mg | ORAL_CAPSULE | Freq: Two times a day (BID) | ORAL | Status: DC
  Administered 2019-11-25 – 2019-12-02 (×14): 100 mg via ORAL
  Filled 2019-11-25 (×14): qty 1

## 2019-11-25 MED ORDER — CHLORHEXIDINE GLUCONATE CLOTH 2 % EX PADS
6.0000 | MEDICATED_PAD | Freq: Every day | CUTANEOUS | Status: DC
  Administered 2019-11-25: 6 via TOPICAL

## 2019-11-25 MED ORDER — NEPRO/CARBSTEADY PO LIQD
237.0000 mL | Freq: Two times a day (BID) | ORAL | Status: DC
  Administered 2019-11-27 – 2019-12-02 (×10): 237 mL via ORAL

## 2019-11-25 MED ORDER — ONDANSETRON HCL 4 MG/2ML IJ SOLN: 4.0000 mg | Freq: Four times a day (QID) | INTRAMUSCULAR | Status: DC | PRN

## 2019-11-25 MED ORDER — NITROGLYCERIN 0.4 MG SL SUBL: 0.4000 mg | SUBLINGUAL_TABLET | SUBLINGUAL | Status: DC | PRN

## 2019-11-25 MED ORDER — PANCRELIPASE (LIP-PROT-AMYL) 36000-114000 UNITS PO CPEP
36000.0000 [IU] | ORAL_CAPSULE | Freq: Every day | ORAL | Status: DC
  Administered 2019-11-26 – 2019-12-02 (×7): 36000 [IU] via ORAL
  Filled 2019-11-25 (×7): qty 1

## 2019-11-25 MED ORDER — ATORVASTATIN CALCIUM 40 MG PO TABS
40.0000 mg | ORAL_TABLET | Freq: Every day | ORAL | Status: DC
  Administered 2019-11-25 – 2019-12-01 (×7): 40 mg via ORAL
  Filled 2019-11-25 (×7): qty 1

## 2019-11-25 MED ORDER — POLYETHYLENE GLYCOL 3350 17 G PO PACK
17.0000 g | PACK | Freq: Every day | ORAL | Status: DC
  Administered 2019-11-26: 17 g via ORAL
  Filled 2019-11-25: qty 1

## 2019-11-25 MED ORDER — ASPIRIN EC 81 MG PO TBEC
81.0000 mg | DELAYED_RELEASE_TABLET | Freq: Every day | ORAL | Status: DC
  Administered 2019-11-26 – 2019-12-02 (×7): 81 mg via ORAL
  Filled 2019-11-25 (×7): qty 1

## 2019-11-25 NOTE — Progress Notes (Signed)
PMR Admission Coordinator Pre-Admission Assessment   Patient: Jon Anderson is an 69 y.o., male MRN: [id number] DOB: [birth year] Height: 5' 11" (180.3 cm) Weight: 76 kg   Insurance Information HMO: yes    PPO:      PCP:      IPA:      80/20:      OTHER:  PRIMARY: U[employer] Medicare      Policy#: [id number]      Subscriber: pt CM Name: Juliann Pulse     Phone#: [phone]     Fax#: [phone] Pre-Cert#: [id number] [place] for CIR provided by Juliann Pulse for admit 8/17 with updates due to fax listed above on 8/23.       Employer:  Benefits:  Phone #: [phone]     Name:  Eff. Date: 04/11/19     Deduct: $0      Out of Pocket Max: $4500 (met)      Life Max: n/a CIR: $325/day for days 1-5      SNF: 20 full days Outpatient:      Co-Pay: $35/visit Home Health: 100%      Co-Pay:  DME: 80%     Co-Pay: 20% Providers:  SECONDARY: BCBS State      Policy#: [id number]     Phone#: [phone]   Financial Counselor:       Phone#:    The "Data Collection Information Summary" for patients in Inpatient Rehabilitation Facilities with attached "Privacy Act [place] Records" was provided and verbally reviewed with: Patient and Family   Emergency Contact Information         Contact Information     Name Relation Home Work Mobile    Harrison Spouse [phone]   [phone]    Koob,Lareese Daughter [phone]   [phone]         Current Medical History  Patient Admitting Diagnosis: R transmet amputation    History of Present Illness: Jon Anderson is a 69 year old right-handed male with history of CAD/CABG 2019, end-stage renal disease with hemodialysis MWF, type 2 diabetes mellitus hyperlipidemia, chronic constipation, peripheral vascular disease with right fifth 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  Presented 11/19/2019 with ulceration of the fourth metatarsal and tip of the right great toe.  No change with conservative care and patient  underwent right transmetatarsal amputation 11/19/2019 per Dr. Sharol Given.  Wound VAC as directed.  Hemodialysis ongoing as per renal services.  Patient remains on aspirin and Plavix as prior to admission.  Therapy evaluations completed and patient was recommended for a comprehensive rehab program.   Patient's medical record from [facility] has been reviewed by the rehabilitation admission coordinator and physician.   Past Medical History  Past Medical History:  Diagnosis Date  . Anemia of chronic disease    . CAD (coronary artery disease)      a.  Myoview 4/11: EF 53%, no scar or ischemia   c. MV 2012 Nl perfusion, apical thinning.  No ischemia or scar.  EF 49%, appears greater by visual estimate.;  d.  Dob stress echo 12/13:  Negative Dob stress echo. There is no evidence of ischemia.  The LVF is normal. b. Normal cors 2016.  . Carotid stenosis      a. <73% RICA, >41% LICA by duplex 12/3788  . Chronic chest pain      occ  . Constipation      chronic  . Dyspnea      with extertion  . ESRD (end stage renal  disease) on dialysis [facility])      M-W-F- [name]  . GERD (gastroesophageal reflux disease)    . History of kidney stones      Passed  . HNP (herniated nucleus pulposus), lumbar    . HTN (hypertension)      echo 3/10: EF 60%, LAE  . Hyperlipidemia    . Nephrolithiasis      "passed them all"  . Peripheral arterial disease ([place])      a. s/p PTCA  right   . Pneumonia yrs ago  . Restless legs    . Sciatic leg pain    . Snores      a. presumed OSA, pt has refused sleep eval in past.  . Type II diabetes mellitus ([place])      no longer on medications, checks blood glucose at home  . Urinary frequency        Family History   family history includes Cancer in his mother; Cirrhosis in his father; Diabetes in his brother and father; Heart attack in his sister.   Prior Rehab/Hospitalizations Has the patient had prior rehab or hospitalizations prior to admission? Yes   Has the patient  had major surgery during 100 days prior to admission? Yes              Current Medications   Current Facility-Administered Medications:  .  0.9 %  sodium chloride infusion, , Intravenous, Continuous, Howze, Leanord Hawking, MD, Last Rate: 10 mL/hr at 11/19/19 0858, Restarted at 11/19/19 1107 .  0.9 %  sodium chloride infusion, , Intravenous, Continuous, Persons, Bevely Palmer, [place], Stopped at 11/20/19 1303 .  acetaminophen (TYLENOL) tablet 325-650 mg, 325-650 mg, Oral, Q6H PRN, Persons, Bevely Palmer, PA .  aspirin EC tablet 81 mg, 81 mg, Oral, Daily, Persons, Bevely Palmer, [place], 81 mg at 11/25/19 0934 .  atorvastatin (LIPITOR) tablet 40 mg, 40 mg, Oral, q1800, Persons, Bevely Palmer, PA, 40 mg at 11/24/19 1643 .  calcitRIOL (ROCALTROL) capsule 1.25 mcg, 1.25 mcg, Oral, Q M,W,F-HD, Collins, Samantha G, PA-C, 1.25 mcg at 11/24/19 1131 .  calcium acetate (PHOSLO) capsule 1,334 mg, 1,334 mg, Oral, BID WC, Persons, Bevely Palmer, [place], 1,334 mg at 11/25/19 0941 .  Chlorhexidine Gluconate Cloth 2 % PADS 6 each, 6 each, Topical, Q0600, Loren Racer, PA-C, 6 each at 11/25/19 1010 .  clopidogrel (PLAVIX) tablet 75 mg, 75 mg, Oral, Daily, Persons, Bevely Palmer, [place], 75 mg at 11/25/19 0934 .  docusate sodium (COLACE) capsule 100 mg, 100 mg, Oral, BID, Persons, Bevely Palmer, PA, 100 mg at 11/25/19 0933 .  feeding supplement (NEPRO CARB STEADY) liquid 237 mL, 237 mL, Oral, BID BM, Loren Racer, PA-C, 237 mL at 11/25/19 0937 .  gabapentin (NEURONTIN) capsule 100 mg, 100 mg, Oral, BID, Persons, Bevely Palmer, PA, 100 mg at 11/25/19 0933 .  HYDROmorphone (DILAUDID) injection 0.5 mg, 0.5 mg, Intravenous, Q4H PRN, Persons, Bevely Palmer, PA, 0.5 mg at 11/21/19 1532 .  lipase/protease/amylase (CREON) capsule 36,000 Units, 36,000 Units, Oral, Daily, Persons, Bevely Palmer, [place], 36,000 Units at 11/25/19 0934 .  midodrine (PROAMATINE) tablet 5 mg, 5 mg, Oral, Q M,W,F, Persons, Bevely Palmer, [place], 5 mg at 11/24/19 1243 .  multivitamin (RENA-VIT) tablet 1  tablet, 1 tablet, Oral, QHS, Persons, Bevely Palmer, [place], 1 tablet at 11/24/19 2232 .  Naldemedine (Symproic) 0.2 mg tab - patient's own supply, 0.2 mg, Oral, Daily PRN, Newt Minion, MD, 0.2 mg at 11/24/19 2325 .  nitroGLYCERIN (NITROSTAT) SL  tablet 0.4 mg, 0.4 mg, Sublingual, Q5 min PRN, Persons, Bevely Palmer, PA .  ondansetron (ZOFRAN) tablet 4 mg, 4 mg, Oral, Q6H PRN **OR** ondansetron (ZOFRAN) injection 4 mg, 4 mg, Intravenous, Q6H PRN, Persons, Bevely Palmer, PA, 4 mg at 11/23/19 1720 .  oxyCODONE (Oxy IR/ROXICODONE) immediate release tablet 5-10 mg, 5-10 mg, Oral, Q4H PRN, Persons, Bevely Palmer, PA, 10 mg at 11/25/19 0936 .  pantoprazole (PROTONIX) EC tablet 40 mg, 40 mg, Oral, Daily, Persons, Bevely Palmer, [place], 40 mg at 11/25/19 0934 .  polyethylene glycol (MIRALAX / GLYCOLAX) packet 17 g, 17 g, Oral, Daily, Collins, Samantha G, PA-C, 17 g at 11/25/19 4765   Patients Current Diet:     Diet Order                      Diet Carb Modified Fluid consistency: Thin; Room service appropriate? Yes  Diet effective now                      Precautions / Restrictions Precautions Precautions: Fall Precaution Comments: wound vac, R foot Other Brace: post op shoe Restrictions Weight Bearing Restrictions: Yes RLE Weight Bearing: Touchdown weight bearing Other Position/Activity Restrictions: wound vac    Has the patient had 2 or more falls or a fall with injury in the past year? 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 Independent   Indoor Mobility: Did the patient need assistance with walking from room to room (with or without device)? Independent   Stairs: Did the patient need assistance with internal or external stairs (with or without device)? Independent   Functional Cognition: Did the patient need help planning regular tasks  such as shopping or remembering to take medications? [place] Devices/Equipment: Cane (specify quad or straight), Blood pressure cuff, CBG Meter, Dentures (specify type), Eyeglasses Home Equipment: Walker - 2 wheels, Cane - single point, Bedside commode   Prior Device Use: Indicate devices/aids used by the patient prior to current illness, exacerbation or injury? cane   Current Functional Level Cognition   Overall Cognitive Status: Within Functional Limits for tasks assessed Orientation Level: Oriented X4    Extremity Assessment (includes Sensation/Coordination)   Upper Extremity Assessment: Overall WFL for tasks assessed, Defer to OT evaluation  Lower Extremity Assessment: LLE deficits/detail, RLE deficits/detail RLE Deficits / Details: ROM: WFL (PROM of ankle); MMT: hip and knee 4+/5, ankle 0/5 (noted pt did have nerve block and still reports numbness mid shin to foot) LLE Deficits / Details: ROM WFL; MMT 5/5     ADLs   Overall ADL's : Needs assistance/impaired Eating/Feeding: Independent Grooming: Wash/dry hands, Wash/dry face, Sitting, Set up Upper Body Bathing: Set up, Sitting Lower Body Bathing: Moderate assistance, Sitting/lateral leans Upper Body Dressing : Set up, Sitting Lower Body Dressing: Moderate assistance, Sitting/lateral leans Toilet Transfer: Minimal assistance, Ambulation, BSC, RW Toilet Transfer Details (indicate cue type and reason): ambulated to bathroom Toileting- Clothing Manipulation and Hygiene: Set up, Sitting/lateral lean Functional mobility during ADLs: Minimal assistance, Rolling walker General ADL Comments: Began educating pt in compensatory strategies for LB bathing and dressing.     Mobility   Overal bed mobility: Modified Independent General bed mobility comments: Pt in recliner on arrival.     Transfers  Overall transfer level: Needs assistance Equipment used: Rolling walker (2  wheeled) Transfers: Sit to/from Stand Sit to Stand: Min assist General transfer comment: Cues for hand placement to and from seated surface.  Good adherence to weight bearing and followed commands for foot placement.     Ambulation / Gait / Stairs / Wheelchair Mobility   Ambulation/Gait Ambulation/Gait assistance: Herbalist (Feet): 20 Feet Assistive device: Rolling walker (2 wheeled) Gait Pattern/deviations: Step-to pattern, Antalgic, Trunk flexed General Gait Details: Pt performed hop to pattern with NWB status despite order for TWB.  Min assistance for safety and steadying.  He began to fatigue at end of trial. Gait velocity: decreased     Posture / Balance Balance Overall balance assessment: Needs assistance Sitting-balance support: No upper extremity supported Sitting balance-Leahy Scale: Good Standing balance support: Bilateral upper extremity supported, Single extremity supported Standing balance-Leahy Scale: Poor Standing balance comment: Pt removed 1 hand from RW to pull up face mask and was mildly unsteady; with both hands required min guard     Special needs/care consideration Dialysis: Hemodialysis Monday, Wednesday and Friday, Oxygen during dialysis and Diabetic management yes    Previous Home Environment (from acute therapy documentation) Living Arrangements: Spouse/significant other, Children Available Help at Discharge: Family, Available 24 hours/day Type of Home: House Home Layout: One level Home Access: Stairs to enter Entrance Stairs-Rails: None Entrance Stairs-Number of Steps: 3 Bathroom Shower/Tub: Public librarian, Architectural technologist: Handicapped height Home Care Services: No   Discharge Living Setting Plans for Discharge Living Setting: Patient's home Type of Home at Discharge: House Discharge Home Layout: One level Discharge Home Access: Stairs to enter Entrance Stairs-Rails: None Entrance Stairs-Number of Steps: 2 Discharge  Bathroom Shower/Tub: Tub/shower unit Discharge Bathroom Toilet: Standard Discharge Bathroom Accessibility: Yes How Accessible: Accessible via walker Does the patient have any problems obtaining your medications?: No   Social/Family/Support Systems Patient Roles: Spouse Anticipated Caregiver: Stanton Kidney (spouse) and Eugenio Hoes (daughter) Anticipated Caregiver's Contact Information: Stanton Kidney [phone] Ability/Limitations of Caregiver: supervision to min guard Caregiver Availability: 24/7 Discharge Plan Discussed with Primary Caregiver: Yes Is Caregiver In Agreement with Plan?: Yes Does Caregiver/Family have Issues with Lodging/Transportation while Pt is in Rehab?: No   Goals Patient/Family Goal for Rehab: PT/OT mod I Expected length of stay: 6-9 days Pt/Family Agrees to Admission and willing to participate: Yes Program Orientation Provided & Reviewed with Pt/Caregiver Including Roles  & Responsibilities: Yes  Barriers to Discharge: Insurance for SNF coverage   Decrease burden of Care through IP rehab admission: n/a   Possible need for SNF placement upon discharge: Not anticipated   Patient Condition: I have reviewed medical records from [facility], spoken with CM, and patient. I met with patient at the bedside for inpatient rehabilitation assessment.  Patient will benefit from ongoing PT and OT, can actively participate in 3 hours of therapy a day 5 days of the week, and can make measurable gains during the admission.  Patient will also benefit from the coordinated team approach during an Inpatient Acute Rehabilitation admission.  The patient will receive intensive therapy as well as Rehabilitation physician, nursing, social worker, and care management interventions.  Due to safety, skin/wound care, disease management, medication administration, pain management and patient education the patient requires 24 hour a day rehabilitation nursing.  The patient is currently min assist with mobility  and basic ADLs.  Discharge setting and therapy post discharge at home is anticipated.  Patient has agreed to participate in the Acute Inpatient Rehabilitation Program  and will admit today.   Preadmission Screen Completed By:  Michel Santee, PT, DPT 11/25/2019 11:18 AM ______________________________________________________________________   Discussed status with Dr. Dagoberto Ligas on 11/25/19  at 11:23 AM  and received approval for admission today.   Admission Coordinator:  Michel Santee, PT, DPT time 11:23 AM Sudie Grumbling 11/25/19     Assessment/Plan: Diagnosis: 1. Does the need for close, 24 hr/day Medical supervision in concert with the patient's rehab needs make it unreasonable for this patient to be served in a less intensive setting? Yes 2. Co-Morbidities requiring supervision/potential complications: ESRD on HD, M/W/F, Transmet amputation, HTN, CAD, PAD 3. Due to bladder management, bowel management, safety, skin/wound care, disease management, medication administration, pain management and patient education, does the patient require 24 hr/day rehab nursing? Yes 4. Does the patient require coordinated care of a physician, rehab nurse, PT, OT, and SLP to address physical and functional deficits in the context of the above medical diagnosis(es)? Yes Addressing deficits in the following areas: balance, endurance, locomotion, strength, transferring, bathing, dressing, feeding, grooming and toileting 5. Can the patient actively participate in an intensive therapy program of at least 3 hrs of therapy 5 days a week? Yes 6. The potential for patient to make measurable gains while on inpatient rehab is good 7. Anticipated functional outcomes upon discharge from inpatient rehab: modified independent PT, modified independent OT, n/a SLP 8. Estimated rehab length of stay to reach the above functional goals is: 6-9 days 9. Anticipated discharge destination: Home 10. Overall Rehab/Functional Prognosis: good      MD Signature:

## 2019-11-25 NOTE — Discharge Summary (Signed)
Discharge Diagnoses:  Active Problems:   Subacute osteomyelitis, right ankle and foot ([place])   Gangrene of right foot ([facility])   Surgeries: Procedure(s): RIGHT TRANSMETATARSAL AMPUTATION on 11/19/2019    Consultants: Treatment Team:  Roney Jaffe, MD  Discharged Condition: Improved  Hospital Course: Jon Anderson is an 69 y.o. male who was admitted 11/19/2019 with a chief complaint of Right Foot Gangrene with a final diagnosis of Osteomyelitis Right Foot.  Patient was brought to the operating room on 11/19/2019 and underwent Procedure(s): RIGHT TRANSMETATARSAL AMPUTATION.    Patient was given perioperative antibiotics:  Anti-infectives (From admission, onward)   Start     Dose/Rate Route Frequency Ordered Stop   11/20/19 1300  ceFAZolin (ANCEF) IVPB 1 g/50 mL premix        1 g 100 mL/hr over 30 Minutes Intravenous Every 24 hours 11/19/19 1302 11/20/19 1333   11/19/19 1300  ceFAZolin (ANCEF) IVPB 1 g/50 mL premix  Status:  Discontinued        1 g 100 mL/hr over 30 Minutes Intravenous Every 6 hours 11/19/19 1253 11/19/19 1302   11/19/19 0815  ceFAZolin (ANCEF) IVPB 2g/100 mL premix        2 g 200 mL/hr over 30 Minutes Intravenous On call to O.R. 11/19/19 0810 11/19/19 1115    .  Patient was given sequential compression devices, early ambulation, and aspirin for DVT prophylaxis.  Recent vital signs:  Patient Vitals for the past 24 hrs:  BP Temp Temp src Pulse Resp SpO2 Weight  11/25/19 0822 (!) 115/56 99 F (37.2 C) Oral 90 17 94 % --  11/25/19 0302 109/69 98 F (36.7 C) Oral 89 17 90 % --  11/24/19 1927 101/71 98.9 F (37.2 C) Oral 81 16 91 % --  11/24/19 1244 107/64 98.4 F (36.9 C) Oral 82 18 96 % --  11/24/19 1142 (!) 102/58 -- -- 89 18 92 % 76 kg  11/24/19 1130 (!) 102/54 -- -- 92 18 -- --  .  Recent laboratory studies: No results found.  Discharge Medications:   Allergies as of 11/25/2019      Reactions   Kiwi Extract Itching, Swelling, Other (See Comments)    Lips and face swell- breathing not affected   Flexeril [cyclobenzaprine]    Hands become flimsy, can not hold things   Tape Other (See Comments)   "Plastic" tape causes blisters!!      Medication List    STOP taking these medications   senna-docusate 8.6-50 MG tablet Commonly known as: Senokot-S     TAKE these medications   acetaminophen 500 MG tablet Commonly known as: TYLENOL Take 1,000 mg by mouth every 4 (four) hours as needed for moderate pain or headache.   aspirin EC 81 MG tablet Take 1 tablet (81 mg total) by mouth daily.   atorvastatin 40 MG tablet Commonly known as: LIPITOR Take 1 tablet (40 mg total) by mouth daily at 6 PM.   calcium acetate 667 MG capsule Commonly known as: PHOSLO Take 3 capsules (2,001 mg total) by mouth 3 (three) times daily with meals. What changed:   how much to take  when to take this   clopidogrel 75 MG tablet Commonly known as: PLAVIX Take 75 mg by mouth daily.   clotrimazole 1 % cream Commonly known as: LOTRIMIN Apply between toes and under toes twice daily for 4 weeks.   Creon 36000 UNITS Cpep capsule Generic drug: lipase/protease/amylase Take 36,000 Units by mouth daily.   gabapentin  100 MG capsule Commonly known as: NEURONTIN Take 100 mg by mouth 2 (two) times daily.   loperamide 2 MG capsule Commonly known as: IMODIUM Take 2 mg by mouth as needed for diarrhea or loose stools.   midodrine 5 MG tablet Commonly known as: PROAMATINE Take 5 mg by mouth every Monday, Wednesday, and Friday. Take with Dialysis   multivitamin Tabs tablet Take 1 tablet by mouth at bedtime.   nitroGLYCERIN 0.4 MG SL tablet Commonly known as: Nitrostat Place 1 tablet (0.4 mg total) under the tongue every 5 (five) minutes as needed for chest pain. What changed: Another medication with the same name was removed. Continue taking this medication, and follow the directions you see here.   OneTouch Ultra test strip Generic drug: glucose  blood   oxyCODONE 5 MG immediate release tablet Commonly known as: Oxy IR/ROXICODONE Take 5 mg by mouth every 4 (four) hours as needed for severe pain.   pantoprazole 40 MG tablet Commonly known as: PROTONIX Take 40 mg by mouth daily.   pentoxifylline 400 MG CR tablet Commonly known as: TRENTAL Take 1 tablet (400 mg total) by mouth daily.   polyethylene glycol 17 g packet Commonly known as: MIRALAX / GLYCOLAX Take 17 g by mouth daily as needed for moderate constipation.   silver sulfADIAZINE 1 % cream Commonly known as: Silvadene Apply 1 application topically daily.   SYMPROIC PO Take 1 tablet by mouth daily as needed.       Diagnostic Studies: XR Foot 2 Views Right  Result Date: 11/04/2019 2 view radiographs of the right foot shows extensive calcification of the arteries out to the tips of his toes no destructive bony changes of the fourth metatarsal head but there is destruction of the tuft of the great toe consistent with chronic osteomyelitis.   Patient benefited maximally from their hospital stay and there were no complications.     Disposition: Discharge disposition: 02-Transferred to [facility]      Discharge Instructions    Call MD / Call 911   Complete by: As directed    If you experience chest pain or shortness of breath, CALL 911 and be transported to the hospital emergency room.  If you develope a fever above 101 F, pus (white drainage) or increased drainage or redness at the wound, or calf pain, call your surgeon's office.   Constipation Prevention   Complete by: As directed    Drink plenty of fluids.  Prune juice may be helpful.  You may use a stool softener, such as Colace (over the counter) 100 mg twice a day.  Use MiraLax (over the counter) for constipation as needed.   Diet - low sodium heart healthy   Complete by: As directed    Increase activity slowly as tolerated   Complete by: As directed    Negative Pressure Wound Therapy -  Incisional   Complete by: As directed    Show patient how to attach prevena vac   Negative Pressure Wound Therapy - Incisional   Complete by: As directed    Prevena VAC may be removed 11/26/2019      Follow-up Information    Suzan Slick, NP.   Specialty: Orthopedic Surgery Contact information: [address] [place] [address] [phone]                Signed: Bevely Palmer Lindey Renzulli 11/25/2019, 11:23 AM

## 2019-11-25 NOTE — Progress Notes (Signed)
POD 6 status post transmetatarsal amputation.  Patient is doing well without complaints wound VAC has 1 day left on Prevena.  Patient is ready to transfer to rehab.  Patient states that rehab was "waiting for Ortho ".  Vital signs stable afebrile alert pleasant comfortable.  Wound VAC in place.   Patient is stable will discharge to rehab today.

## 2019-11-25 NOTE — Progress Notes (Addendum)
Inpatient Rehab Admissions Coordinator:   Addendum: plan to admit pt today.  Shanon Rosser, [place], in agreement.   I received insurance auth for CIR and have a bed available for pt to admit today.  Awaiting final confirmation from medical team.      Shann Medal, PT, DPT Admissions Coordinator [phone] 11/25/19  11:16 AM

## 2019-11-25 NOTE — Plan of Care (Signed)

## 2019-11-25 NOTE — Progress Notes (Addendum)
Physical Therapy Treatment Patient Details Name: Jon Anderson MRN: [id number] DOB: 1950/09/02 Today's Date: 11/25/2019    History of Present Illness Pt is a 69 year old man admitted with chronic, non-healing wounds of R toes warranting transmetatarsal amputation.  PMH: ESRD, CAD, DM, HTN, PAD, chronic anemia.    PT Comments    Pt supine in bed on arrival.  He performed LE strengthening and ROM.  Continues to complain on discomfort in R hip with hip flexion.  Pt eager to mobilize and improve functional mobility.  He continues to be an excellent candidate for aggressive rehab in a post acute setting.   Follow Up Recommendations  CIR     Equipment Recommendations  Rolling walker with 5" wheels;3in1 (PT)    Recommendations for Other Services       Precautions / Restrictions Precautions Precautions: Fall Precaution Comments: wound vac, R foot Required Braces or Orthoses: Other Brace Other Brace: post op shoe Restrictions Weight Bearing Restrictions: Yes RLE Weight Bearing: Touchdown weight bearing Other Position/Activity Restrictions: wound vac    Mobility  Bed Mobility Overal bed mobility: Modified Independent                Transfers Overall transfer level: Needs assistance Equipment used: Rolling walker (2 wheeled) Transfers: Sit to/from Stand Sit to Stand: Mod assist         General transfer comment: Cues for hand placement to and from seated surface.  Good adherence to weight bearing and followed commands for foot placement.  Increased assistance to stand today due to LOB.  Ambulation/Gait Ambulation/Gait assistance: Min assist;+2 safety/equipment Gait Distance (Feet): 30 Feet Assistive device: Rolling walker (2 wheeled) Gait Pattern/deviations: Step-to pattern;Antalgic;Trunk flexed Gait velocity: decreased   General Gait Details: Pt performed hop to pattern with NWB status despite order for TWB.  Min assistance for safety and steadying.  He began  to fatigue at end of trial but refused to sit in the recliner.   Stairs             Wheelchair Mobility    Modified Rankin (Stroke Patients Only)       Balance Overall balance assessment: Needs assistance Sitting-balance support: No upper extremity supported Sitting balance-Leahy Scale: Good     Standing balance support: Bilateral upper extremity supported;Single extremity supported Standing balance-Leahy Scale: Poor                              Cognition Arousal/Alertness: Awake/alert Behavior During Therapy: WFL for tasks assessed/performed Overall Cognitive Status: Within Functional Limits for tasks assessed                                        Exercises General Exercises - Lower Extremity Ankle Circles/Pumps: AROM;Both;10 reps;Supine Quad Sets: AROM;Both;10 reps;Supine Heel Slides: AROM;Both;10 reps;Supine;AAROM Hip ABduction/ADduction: AROM;Both;10 reps;Supine Straight Leg Raises: AROM;Both;10 reps;Supine    General Comments        Pertinent Vitals/Pain Pain Assessment: 0-10 Pain Score:  (12/10) Pain Location: R foot Pain Descriptors / Indicators: Throbbing;Grimacing;Discomfort Pain Intervention(s): Monitored during session;Repositioned    Home Living                      Prior Function            PT Goals (current goals can now be found in the care plan  section) Acute Rehab PT Goals Patient Stated Goal: to not be a burden to his family - open to rehab at d/c Potential to Achieve Goals: Good Progress towards PT goals: Progressing toward goals    Frequency    Min 4X/week      PT Plan Current plan remains appropriate    Co-evaluation              AM-PAC PT "6 Clicks" Mobility   Outcome Measure  Help needed turning from your back to your side while in a flat bed without using bedrails?: None Help needed moving from lying on your back to sitting on the side of a flat bed without using  bedrails?: None Help needed moving to and from a bed to a chair (including a wheelchair)?: A Little Help needed standing up from a chair using your arms (e.g., wheelchair or bedside chair)?: A Little Help needed to walk in hospital room?: A Little Help needed climbing 3-5 steps with a railing? : A Lot 6 Click Score: 19    End of Session Equipment Utilized During Treatment: Gait belt Activity Tolerance: Patient tolerated treatment well Patient left: with call bell/phone within reach (seated on commode, reports he needs to sit for about 30 min to attempt a BM.  Tech informed and patient educated to pull string when finished.) Nurse Communication: Mobility status (Informed NT he was seated on commode.) PT Visit Diagnosis: Unsteadiness on feet (R26.81);Other abnormalities of gait and mobility (R26.89);Muscle weakness (generalized) (M62.81)     Time: 7948-0165 PT Time Calculation (min) (ACUTE ONLY): 23 min  Charges:  $Gait Training: 8-22 mins $Therapeutic Exercise: 8-22 mins                     Jon Anderson , PTA Acute Rehabilitation Services Pager [phone] Office [phone]     Jon Anderson Jon Anderson 11/25/2019, 1:05 PM

## 2019-11-25 NOTE — Plan of Care (Signed)
  Problem: Education: Goal: Knowledge of General Education information will improve Description: Including pain rating scale, medication(s)/side effects and non-pharmacologic comfort measures 11/25/2019 0816 by Baldomero Lamy, RN Outcome: Progressing 11/25/2019 0808 by Baldomero Lamy, RN Outcome: Progressing   Problem: Health Behavior/Discharge Planning: Goal: Ability to manage health-related needs will improve 11/25/2019 0816 by Baldomero Lamy, RN Outcome: Progressing 11/25/2019 0808 by Baldomero Lamy, RN Outcome: Progressing   Problem: Clinical Measurements: Goal: Ability to maintain clinical measurements within normal limits will improve 11/25/2019 0816 by Baldomero Lamy, RN Outcome: Progressing 11/25/2019 0808 by Baldomero Lamy, RN Outcome: Progressing Goal: Will remain free from infection 11/25/2019 0816 by Baldomero Lamy, RN Outcome: Progressing 11/25/2019 0808 by Baldomero Lamy, RN Outcome: Progressing Goal: Diagnostic test results will improve 11/25/2019 0816 by Baldomero Lamy, RN Outcome: Progressing 11/25/2019 0808 by Baldomero Lamy, RN Outcome: Progressing Goal: Respiratory complications will improve 11/25/2019 0816 by Baldomero Lamy, RN Outcome: Progressing 11/25/2019 0808 by Baldomero Lamy, RN Outcome: Progressing Goal: Cardiovascular complication will be avoided 11/25/2019 0816 by Baldomero Lamy, RN Outcome: Progressing 11/25/2019 0808 by Baldomero Lamy, RN Outcome: Progressing   Problem: Activity: Goal: Risk for activity intolerance will decrease 11/25/2019 0816 by Baldomero Lamy, RN Outcome: Progressing 11/25/2019 0808 by Baldomero Lamy, RN Outcome: Progressing   Problem: Nutrition: Goal: Adequate nutrition will be maintained 11/25/2019 0816 by Baldomero Lamy, RN Outcome: Progressing 11/25/2019 0808 by Baldomero Lamy, RN Outcome: Progressing   Problem: Coping: Goal: Level of anxiety will decrease 11/25/2019 0816 by Baldomero Lamy, RN Outcome: Progressing 11/25/2019 0808 by Baldomero Lamy, RN Outcome: Progressing   Problem: Elimination: Goal: Will not experience complications related to bowel motility 11/25/2019 0816 by Baldomero Lamy, RN Outcome: Progressing 11/25/2019 0808 by Baldomero Lamy, RN Outcome: Progressing Goal: Will not experience complications related to urinary retention 11/25/2019 0816 by Baldomero Lamy, RN Outcome: Progressing 11/25/2019 0808 by Baldomero Lamy, RN Outcome: Progressing   Problem: Pain Managment: Goal: General experience of comfort will improve 11/25/2019 0816 by Baldomero Lamy, RN Outcome: Progressing 11/25/2019 0808 by Baldomero Lamy, RN Outcome: Progressing   Problem: Safety: Goal: Ability to remain free from injury will improve 11/25/2019 0816 by Baldomero Lamy, RN Outcome: Progressing 11/25/2019 0808 by Baldomero Lamy, RN Outcome: Progressing   Problem: Skin Integrity: Goal: Risk for impaired skin integrity will decrease 11/25/2019 0816 by Baldomero Lamy, RN Outcome: Progressing 11/25/2019 0808 by Baldomero Lamy, RN Outcome: Progressing

## 2019-11-25 NOTE — Plan of Care (Signed)
  Problem: Pain Managment: Goal: General experience of comfort will improve Outcome: Progressing   Problem: Safety: Goal: Ability to remain free from injury will improve Outcome: Progressing   Problem: Skin Integrity: Goal: Risk for impaired skin integrity will decrease Outcome: Progressing   

## 2019-11-25 NOTE — PMR Pre-admission (Signed)
PMR Admission Coordinator Pre-Admission Assessment   Patient: Jon Anderson is an 69 y.o., male MRN: [id number] DOB: 08/26/1950 Height: 5' 11" (180.3 cm) Weight: 76 kg   Insurance Information HMO: yes    PPO:      PCP:      IPA:      80/20:      OTHER:  PRIMARY: [employer] Medicare      Policy#: [id number]      Subscriber: pt CM Name: Kathy     Phone#: [phone]     Fax#: [phone] Pre-Cert#: [id number] auth for CIR provided by Kathy for admit 8/17 with updates due to fax listed above on 8/23.       Employer:  Benefits:  Phone #: [phone]     Name:  Eff. Date: 04/11/19     Deduct: $0      Out of Pocket Max: $4500 (met)      Life Max: n/a CIR: $325/day for days 1-5      SNF: 20 full days Outpatient:      Co-Pay: $35/visit Home Health: 100%      Co-Pay:  DME: 80%     Co-Pay: 20% Providers:  SECONDARY: BCBS State      Policy#: [id number]     Phone#: [phone]   Financial Counselor:       Phone#:    The "Data Collection Information Summary" for patients in Inpatient Rehabilitation Facilities with attached "Privacy Act Statement-Health Care Records" was provided and verbally reviewed with: Patient and Family   Emergency Contact Information         Contact Information     Name Relation Home Work Mobile    Packett,Mary Spouse [phone]   [phone]    Goth,Lareese Daughter [phone]   [phone]         Current Medical History  Patient Admitting Diagnosis: R transmet amputation    History of Present Illness: Jon Anderson is a 69-year-old right-handed male with history of CAD/CABG 2019, end-stage renal disease with hemodialysis MWF, type 2 diabetes mellitus hyperlipidemia, chronic constipation, peripheral vascular disease with right fifth 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  Presented 11/19/2019 with ulceration of the fourth metatarsal and tip of the right great toe.  No change with conservative care and patient  underwent right transmetatarsal amputation 11/19/2019 per Dr. Duda.  Wound VAC as directed.  Hemodialysis ongoing as per renal services.  Patient remains on aspirin and Plavix as prior to admission.  Therapy evaluations completed and patient was recommended for a comprehensive rehab program.   Patient's medical record from [facility] has been reviewed by the rehabilitation admission coordinator and physician.   Past Medical History  Past Medical History:  Diagnosis Date  . Anemia of chronic disease    . CAD (coronary artery disease)      a.  Myoview 4/11: EF 53%, no scar or ischemia   c. MV 2012 Nl perfusion, apical thinning.  No ischemia or scar.  EF 49%, appears greater by visual estimate.;  d.  Dob stress echo 12/13:  Negative Dob stress echo. There is no evidence of ischemia.  The LVF is normal. b. Normal cors 2016.  . Carotid stenosis      a. <50% RICA, >70% LICA by duplex 07/2015  . Chronic chest pain      occ  . Constipation      chronic  . Dyspnea      with extertion  . ESRD (end stage renal   disease) on dialysis (HCC)      M-W-F- [facility]  . GERD (gastroesophageal reflux disease)    . History of kidney stones      Passed  . HNP (herniated nucleus pulposus), lumbar    . HTN (hypertension)      echo 3/10: EF 60%, LAE  . Hyperlipidemia    . Nephrolithiasis      "passed them all"  . Peripheral arterial disease (HCC)      a. s/p PTCA  right   . Pneumonia yrs ago  . Restless legs    . Sciatic leg pain    . Snores      a. presumed OSA, pt has refused sleep eval in past.  . Type II diabetes mellitus (HCC)      no longer on medications, checks blood glucose at home  . Urinary frequency        Family History   family history includes Cancer in his mother; Cirrhosis in his father; Diabetes in his brother and father; Heart attack in his sister.   Prior Rehab/Hospitalizations Has the patient had prior rehab or hospitalizations prior to admission? Yes   Has the patient  had major surgery during 100 days prior to admission? Yes              Current Medications   Current Facility-Administered Medications:  .  0.9 %  sodium chloride infusion, , Intravenous, Continuous, Howze, Kathryn E, MD, Last Rate: 10 mL/hr at 11/19/19 0858, Restarted at 11/19/19 1107 .  0.9 %  sodium chloride infusion, , Intravenous, Continuous, Persons, Mary Anne, PA, Stopped at 11/20/19 1303 .  acetaminophen (TYLENOL) tablet 325-650 mg, 325-650 mg, Oral, Q6H PRN, Persons, Mary Anne, PA .  aspirin EC tablet 81 mg, 81 mg, Oral, Daily, Persons, Mary Anne, PA, 81 mg at 11/25/19 0934 .  atorvastatin (LIPITOR) tablet 40 mg, 40 mg, Oral, q1800, Persons, Mary Anne, PA, 40 mg at 11/24/19 1643 .  calcitRIOL (ROCALTROL) capsule 1.25 mcg, 1.25 mcg, Oral, Q M,W,F-HD, Collins, Samantha G, PA-C, 1.25 mcg at 11/24/19 1131 .  calcium acetate (PHOSLO) capsule 1,334 mg, 1,334 mg, Oral, BID WC, Persons, Mary Anne, PA, 1,334 mg at 11/25/19 0941 .  Chlorhexidine Gluconate Cloth 2 % PADS 6 each, 6 each, Topical, Q0600, Stovall, Kathryn R, PA-C, 6 each at 11/25/19 1010 .  clopidogrel (PLAVIX) tablet 75 mg, 75 mg, Oral, Daily, Persons, Mary Anne, PA, 75 mg at 11/25/19 0934 .  docusate sodium (COLACE) capsule 100 mg, 100 mg, Oral, BID, Persons, Mary Anne, PA, 100 mg at 11/25/19 0933 .  feeding supplement (NEPRO CARB STEADY) liquid 237 mL, 237 mL, Oral, BID BM, Stovall, Kathryn R, PA-C, 237 mL at 11/25/19 0937 .  gabapentin (NEURONTIN) capsule 100 mg, 100 mg, Oral, BID, Persons, Mary Anne, PA, 100 mg at 11/25/19 0933 .  HYDROmorphone (DILAUDID) injection 0.5 mg, 0.5 mg, Intravenous, Q4H PRN, Persons, Mary Anne, PA, 0.5 mg at 11/21/19 1532 .  lipase/protease/amylase (CREON) capsule 36,000 Units, 36,000 Units, Oral, Daily, Persons, Mary Anne, PA, 36,000 Units at 11/25/19 0934 .  midodrine (PROAMATINE) tablet 5 mg, 5 mg, Oral, Q M,W,F, Persons, Mary Anne, PA, 5 mg at 11/24/19 1243 .  multivitamin (RENA-VIT) tablet 1  tablet, 1 tablet, Oral, QHS, Persons, Mary Anne, PA, 1 tablet at 11/24/19 2232 .  Naldemedine (Symproic) 0.2 mg tab - patient's own supply, 0.2 mg, Oral, Daily PRN, Duda, Marcus V, MD, 0.2 mg at 11/24/19 2325 .  nitroGLYCERIN (NITROSTAT) SL   tablet 0.4 mg, 0.4 mg, Sublingual, Q5 min PRN, Persons, Mary Anne, PA .  ondansetron (ZOFRAN) tablet 4 mg, 4 mg, Oral, Q6H PRN **OR** ondansetron (ZOFRAN) injection 4 mg, 4 mg, Intravenous, Q6H PRN, Persons, Mary Anne, PA, 4 mg at 11/23/19 1720 .  oxyCODONE (Oxy IR/ROXICODONE) immediate release tablet 5-10 mg, 5-10 mg, Oral, Q4H PRN, Persons, Mary Anne, PA, 10 mg at 11/25/19 0936 .  pantoprazole (PROTONIX) EC tablet 40 mg, 40 mg, Oral, Daily, Persons, Mary Anne, PA, 40 mg at 11/25/19 0934 .  polyethylene glycol (MIRALAX / GLYCOLAX) packet 17 g, 17 g, Oral, Daily, Collins, Samantha G, PA-C, 17 g at 11/25/19 0933   Patients Current Diet:     Diet Order                      Diet Carb Modified Fluid consistency: Thin; Room service appropriate? Yes  Diet effective now                      Precautions / Restrictions Precautions Precautions: Fall Precaution Comments: wound vac, R foot Other Brace: post op shoe Restrictions Weight Bearing Restrictions: Yes RLE Weight Bearing: Touchdown weight bearing Other Position/Activity Restrictions: wound vac    Has the patient had 2 or more falls or a fall with injury in the past year? 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 Independent   Indoor Mobility: Did the patient need assistance with walking from room to room (with or without device)? Independent   Stairs: Did the patient need assistance with internal or external stairs (with or without device)? Independent   Functional Cognition: Did the patient need help planning regular tasks  such as shopping or remembering to take medications? Independent   Home Assistive Devices / Equipment Home Assistive Devices/Equipment: Cane (specify quad or straight), Blood pressure cuff, CBG Meter, Dentures (specify type), Eyeglasses Home Equipment: Walker - 2 wheels, Cane - single point, Bedside commode   Prior Device Use: Indicate devices/aids used by the patient prior to current illness, exacerbation or injury? cane   Current Functional Level Cognition   Overall Cognitive Status: Within Functional Limits for tasks assessed Orientation Level: Oriented X4    Extremity Assessment (includes Sensation/Coordination)   Upper Extremity Assessment: Overall WFL for tasks assessed, Defer to OT evaluation  Lower Extremity Assessment: LLE deficits/detail, RLE deficits/detail RLE Deficits / Details: ROM: WFL (PROM of ankle); MMT: hip and knee 4+/5, ankle 0/5 (noted pt did have nerve block and still reports numbness mid shin to foot) LLE Deficits / Details: ROM WFL; MMT 5/5     ADLs   Overall ADL's : Needs assistance/impaired Eating/Feeding: Independent Grooming: Wash/dry hands, Wash/dry face, Sitting, Set up Upper Body Bathing: Set up, Sitting Lower Body Bathing: Moderate assistance, Sitting/lateral leans Upper Body Dressing : Set up, Sitting Lower Body Dressing: Moderate assistance, Sitting/lateral leans Toilet Transfer: Minimal assistance, Ambulation, BSC, RW Toilet Transfer Details (indicate cue type and reason): ambulated to bathroom Toileting- Clothing Manipulation and Hygiene: Set up, Sitting/lateral lean Functional mobility during ADLs: Minimal assistance, Rolling walker General ADL Comments: Began educating pt in compensatory strategies for LB bathing and dressing.     Mobility   Overal bed mobility: Modified Independent General bed mobility comments: Pt in recliner on arrival.     Transfers     Overall transfer level: Needs assistance Equipment used: Rolling walker (2  wheeled) Transfers: Sit to/from Stand Sit to Stand: Min assist General transfer comment: Cues for hand placement to and from seated surface.  Good adherence to weight bearing and followed commands for foot placement.     Ambulation / Gait / Stairs / Wheelchair Mobility   Ambulation/Gait Ambulation/Gait assistance: Min assist Gait Distance (Feet): 20 Feet Assistive device: Rolling walker (2 wheeled) Gait Pattern/deviations: Step-to pattern, Antalgic, Trunk flexed General Gait Details: Pt performed hop to pattern with NWB status despite order for TWB.  Min assistance for safety and steadying.  He began to fatigue at end of trial. Gait velocity: decreased     Posture / Balance Balance Overall balance assessment: Needs assistance Sitting-balance support: No upper extremity supported Sitting balance-Leahy Scale: Good Standing balance support: Bilateral upper extremity supported, Single extremity supported Standing balance-Leahy Scale: Poor Standing balance comment: Pt removed 1 hand from RW to pull up face mask and was mildly unsteady; with both hands required min guard     Special needs/care consideration Dialysis: Hemodialysis Monday, Wednesday and Friday, Oxygen during dialysis and Diabetic management yes    Previous Home Environment (from acute therapy documentation) Living Arrangements: Spouse/significant other, Children Available Help at Discharge: Family, Available 24 hours/day Type of Home: House Home Layout: One level Home Access: Stairs to enter Entrance Stairs-Rails: None Entrance Stairs-Number of Steps: 3 Bathroom Shower/Tub: Tub/shower unit, Curtain Bathroom Toilet: Handicapped height Home Care Services: No   Discharge Living Setting Plans for Discharge Living Setting: Patient's home Type of Home at Discharge: House Discharge Home Layout: One level Discharge Home Access: Stairs to enter Entrance Stairs-Rails: None Entrance Stairs-Number of Steps: 2 Discharge  Bathroom Shower/Tub: Tub/shower unit Discharge Bathroom Toilet: Standard Discharge Bathroom Accessibility: Yes How Accessible: Accessible via walker Does the patient have any problems obtaining your medications?: No   Social/Family/Support Systems Patient Roles: Spouse Anticipated Caregiver: Mary (spouse) and Lareese (daughter) Anticipated Caregiver's Contact Information: Mary [phone] Ability/Limitations of Caregiver: supervision to min guard Caregiver Availability: 24/7 Discharge Plan Discussed with Primary Caregiver: Yes Is Caregiver In Agreement with Plan?: Yes Does Caregiver/Family have Issues with Lodging/Transportation while Pt is in Rehab?: No   Goals Patient/Family Goal for Rehab: PT/OT mod I Expected length of stay: 6-9 days Pt/Family Agrees to Admission and willing to participate: Yes Program Orientation Provided & Reviewed with Pt/Caregiver Including Roles  & Responsibilities: Yes  Barriers to Discharge: Insurance for SNF coverage   Decrease burden of Care through IP rehab admission: n/a   Possible need for SNF placement upon discharge: Not anticipated   Patient Condition: I have reviewed medical records from [facility], spoken with CM, and patient. I met with patient at the bedside for inpatient rehabilitation assessment.  Patient will benefit from ongoing PT and OT, can actively participate in 3 hours of therapy a day 5 days of the week, and can make measurable gains during the admission.  Patient will also benefit from the coordinated team approach during an Inpatient Acute Rehabilitation admission.  The patient will receive intensive therapy as well as Rehabilitation physician, nursing, social worker, and care management interventions.  Due to safety, skin/wound care, disease management, medication administration, pain management and patient education the patient requires 24 hour a day rehabilitation nursing.  The patient is currently min assist with mobility  and basic ADLs.  Discharge setting and therapy post discharge at home is anticipated.  Patient has agreed to participate in the Acute Inpatient Rehabilitation Program   and will admit today.   Preadmission Screen Completed By:  Caitlin E Warren, PT, DPT 11/25/2019 11:18 AM ______________________________________________________________________   Discussed status with Dr. Bradan Congrove on 11/25/19  at 11:23 AM  and received approval for admission today.   Admission Coordinator:  Caitlin E Warren, PT, DPT time 11:23 AM /Date 11/25/19     Assessment/Plan: Diagnosis: 1. Does the need for close, 24 hr/day Medical supervision in concert with the patient's rehab needs make it unreasonable for this patient to be served in a less intensive setting? Yes 2. Co-Morbidities requiring supervision/potential complications: ESRD on HD, M/W/F, Transmet amputation, HTN, CAD, PAD 3. Due to bladder management, bowel management, safety, skin/wound care, disease management, medication administration, pain management and patient education, does the patient require 24 hr/day rehab nursing? Yes 4. Does the patient require coordinated care of a physician, rehab nurse, PT, OT, and SLP to address physical and functional deficits in the context of the above medical diagnosis(es)? Yes Addressing deficits in the following areas: balance, endurance, locomotion, strength, transferring, bathing, dressing, feeding, grooming and toileting 5. Can the patient actively participate in an intensive therapy program of at least 3 hrs of therapy 5 days a week? Yes 6. The potential for patient to make measurable gains while on inpatient rehab is good 7. Anticipated functional outcomes upon discharge from inpatient rehab: modified independent PT, modified independent OT, n/a SLP 8. Estimated rehab length of stay to reach the above functional goals is: 6-9 days 9. Anticipated discharge destination: Home 10. Overall Rehab/Functional Prognosis: good      MD Signature:  

## 2019-11-25 NOTE — Progress Notes (Signed)
Inpatient Rehabilitation Medication Review by a Pharmacist  A complete drug regimen review was completed for this patient to identify any potential clinically significant medication issues.  Clinically significant medication issues were identified:  yes   Type of Medication Issue Identified Description of Issue Urgent (address now) Non-Urgent (address on AM team rounds) Plan Plan Accepted by Provider? (Yes / No / Pending AM Rounds)  Drug Interaction(s) (clinically significant)       Duplicate Therapy       Allergy       No Medication Administration End Date       Incorrect Dose       Additional Drug Therapy Needed  Trental to be resumed as per discharge summary? Urgent Contacted Ortho. Awaiting response   Other         Name of provider notified for urgent issues identified: Lorin Mercy, [facility] and MA Persons, PA  Provider Method of Notification: Chat  For non-urgent medication issues to be resolved on team rounds tomorrow morning a [facility] [place] was sent to:   Pharmacist comments: Patient with severe PVD on Trental prior to TMA. Dictation summary says to resume, but was not ordered on admit to rehab. Will clarify with Ortho.  Time spent performing this drug regimen review (minutes):  10 minutes  Ortho response: May use it again if he is slow to heal but doesnt need for now, MA Persons, Pa    Lavonne Kinderman S. Alford Highland, PharmD, BCPS Clinical Staff Pharmacist [email or web address]

## 2019-11-25 NOTE — Progress Notes (Signed)
  [name] Progress Note   Subjective:  Seen in room - no new complaints. No CP/dyspnea. S/p HD yesterday without issues. Looks like finally will transfer to CIR today.   Objective Vitals:   11/24/19 1244 11/24/19 1927 11/25/19 0302 11/25/19 0822  BP: 107/64 101/71 109/69 (!) 115/56  Pulse: 82 81 89 90  Resp: 18 16 17 17   Temp: 98.4 F (36.9 C) 98.9 F (37.2 C) 98 F (36.7 C) 99 F (37.2 C)  TempSrc: Oral Oral Oral Oral  SpO2: 96% 91% 90% 94%  Weight:      Height:       Physical Exam General:Well developed male, alert and in NAD Heart:RRR, no murmur, rubs or gallops Lungs:CTA bilaterally without wheezing, rhonchi or rales Abdomen:Soft, non-tender, non-distended. +BS Extremities:R foot wrapped, trace LLE edema Dialysis Access:LUE AVF + bruit  Additional Objective Labs: Basic Metabolic Panel: Recent Labs  Lab 11/20/19 0721 11/21/19 0943 11/24/19 0821  NA 132* 137 136  K 6.2* 4.4 5.3*  CL 92* 97* 92*  CO2 25 28 28   GLUCOSE 172* 133* 94  BUN 53* 32* 58*  CREATININE 11.84* 8.08* 10.75*  CALCIUM 8.9 8.5* 8.8*  PHOS 3.3 4.8* 5.5*   Liver Function Tests: Recent Labs  Lab 11/20/19 0721 11/21/19 0943 11/24/19 0821  ALBUMIN 3.1* 2.9* 2.9*   CBC: Recent Labs  Lab 11/20/19 0721 11/21/19 0943 11/24/19 0822  WBC 10.3 9.3 9.1  HGB 13.0 12.5* 12.4*  HCT 41.5 40.3 38.8*  MCV 93.7 94.4 94.2  PLT 181 164 168   CBG: Recent Labs  Lab 11/22/19 0644 11/23/19 2022 11/24/19 0642 11/24/19 1604 11/24/19 1925  GLUCAP 100* 165* 93 215* 141*   Medications: . sodium chloride 10 mL/hr at 11/19/19 0858  . sodium chloride Stopped (11/20/19 1303)   . aspirin EC  81 mg Oral Daily  . atorvastatin  40 mg Oral q1800  . calcitRIOL  1.25 mcg Oral Q M,W,F-HD  . calcium acetate  1,334 mg Oral BID WC  . Chlorhexidine Gluconate Cloth  6 each Topical Q0600  . Chlorhexidine Gluconate Cloth  6 each Topical Q0600  . clopidogrel  75 mg Oral Daily  .  docusate sodium  100 mg Oral BID  . feeding supplement (NEPRO CARB STEADY)  237 mL Oral BID BM  . gabapentin  100 mg Oral BID  . lipase/protease/amylase  36,000 Units Oral Daily  . midodrine  5 mg Oral Q M,W,F  . multivitamin  1 tablet Oral QHS  . pantoprazole  40 mg Oral Daily  . polyethylene glycol  17 g Oral Daily    Dialysis Orders: MWF @ [facility] 4hr, 450/800, EDW 71.5kg, 2K/2Ca, AVF, UFP #2, heparin 5000 - Parsabiv 2.28mcg IV q HD - Calcitriol 1.21mcg PO q HD  Assessment/Plan: 1. 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 Wound vac in place.To CIR today? 2. ESRD:Continue usual MWF schedule - next HD 8/18. 3. Hypotension/volume:BPlow/stable on admission, uses midodrine pre-HD. About 5kg up per weights - ^ UF with next HD. 4. Anemia:Hgb>12, no ESA indicated. 5. Metabolic bone disease:Ca/Phosat goal- continue home binders and calcitriol. Parasbiv not on formulary, follow calcium, labs pending 6. Nutrition: Alb low -continueNepro protein supps. 7. T2DM 8. CAD  Jon Anderson, Jon Anderson 11/25/2019, 9:41 AM  [employer]

## 2019-11-25 NOTE — H&P (Signed)
Physical Medicine and Rehabilitation Admission H&P     HPI: Jon Anderson is a 69 year old right-handed male with history of CAD/CABG 2019, end-stage renal disease with hemodialysis Monday Wednesday Friday on [employer],, type 2 diabetes mellitus hyperlipidemia, chronic constipation, peripheral vascular disease with right fifth 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  Per chart review patient lives with spouse independent with assistive device and still drives.  1 level home 3 steps to entry.  Presented 11/19/2019 with ulceration of the fourth metatarsal and tip of the right great toe.  No change with conservative care and patient underwent right transmetatarsal amputation 11/19/2019 per Dr. Sharol Given.  Wound VAC as directed.  Hemodialysis ongoing as per renal services.  Patient remains on aspirin and Plavix as prior to admission.  Therapy evaluations completed and patient was admitted for a comprehensive rehab program. \ LBM 7 days ago- 7 DAYS.  Urinates once every 2-3 days- so basically anuric.  Having some GERD Sx's and pain isn't well controlled right now- denies phantom pain, just post op pain.     Review of Systems  Constitutional: Negative for fever.  HENT: Negative for hearing loss.   Eyes: Negative for blurred vision and double vision.  Respiratory: Negative for cough.        Shortness of breath with exertion  Cardiovascular: Positive for leg swelling. Negative for chest pain and palpitations.  Gastrointestinal: Positive for constipation. Negative for heartburn, nausea and vomiting.  Genitourinary: Positive for frequency. Negative for dysuria and flank pain.  Musculoskeletal: Positive for back pain and joint pain.  Skin: Negative for rash.  Neurological:       Restless leg syndrome  All other systems reviewed and are negative.  Past Medical History:  Diagnosis Date  . Anemia of chronic disease   . CAD (coronary artery disease)    a.   Myoview 4/11: EF 53%, no scar or ischemia   c. MV 2012 Nl perfusion, apical thinning.  No ischemia or scar.  EF 49%, appears greater by visual estimate.;  d.  Dob stress echo 12/13:  Negative Dob stress echo. There is no evidence of ischemia.  The LVF is normal. b. Normal cors 2016.  . Carotid stenosis    a. <75% RICA, >10% LICA by duplex 05/5850  . Chronic chest pain    occ  . Constipation    chronic  . Dyspnea    with extertion  . ESRD (end stage renal disease) on dialysis [facility])    M-W-F- [name]  . GERD (gastroesophageal reflux disease)   . History of kidney stones    Passed  . HNP (herniated nucleus pulposus), lumbar   . HTN (hypertension)    echo 3/10: EF 60%, LAE  . Hyperlipidemia   . Nephrolithiasis    "passed them all"  . Peripheral arterial disease ([place])    a. s/p PTCA  right   . Pneumonia yrs ago  . Restless legs   . Sciatic leg pain   . Snores    a. presumed OSA, pt has refused sleep eval in past.  . Type II diabetes mellitus ([place])    no longer on medications, checks blood glucose at home  . Urinary frequency    Past Surgical History:  Procedure Laterality Date  . ABDOMINAL AORTOGRAM W/LOWER EXTREMITY Bilateral 08/21/2019   Procedure: ABDOMINAL AORTOGRAM W/LOWER EXTREMITY;  Surgeon: Waynetta Sandy, MD;  Location: [place] [facility];  Service: Cardiovascular;  Laterality: Bilateral;  .  AMPUTATION Right 07/25/2019   Procedure: RIGHT 5th RAY AMPUTATION;  Surgeon: Newt Minion, MD;  Location: [place];  Service: Orthopedics;  Laterality: Right;  . AMPUTATION Right 11/19/2019   Procedure: RIGHT TRANSMETATARSAL AMPUTATION;  Surgeon: Newt Minion, MD;  Location: [place];  Service: Orthopedics;  Laterality: Right;  . ANGIOPLASTY / STENTING FEMORAL Left 12/11/2013   dr berry  . AV FISTULA PLACEMENT Left 03/19/2014   Procedure: CREATION OF ARTERIOVENOUS (AV) FISTULA  LEFT UPPER ARM;  Surgeon: Mal Misty, MD;  Location: [place];  Service: Vascular;  Laterality:  Left;  . BACK SURGERY  01/2018   screws placed   . CARDIAC CATHETERIZATION  2001 and 2010   . COLONOSCOPY W/ BIOPSIES AND POLYPECTOMY    . COLONOSCOPY WITH PROPOFOL N/A 08/01/2016   Procedure: COLONOSCOPY WITH PROPOFOL;  Surgeon: Carol Ada, MD;  Location: [facility];  Service: Endoscopy;  Laterality: N/A;  . CORONARY ARTERY BYPASS GRAFT  2019   [facility] x 1 bypass  . ESOPHAGOGASTRODUODENOSCOPY (EGD) WITH PROPOFOL N/A 08/01/2016   Procedure: ESOPHAGOGASTRODUODENOSCOPY (EGD) WITH PROPOFOL;  Surgeon: Carol Ada, MD;  Location: [facility];  Service: Endoscopy;  Laterality: N/A;  . FOOT FRACTURE SURGERY Right    ligament repair  . FRACTURE SURGERY     left forearm  . GRAFT APPLICATION Right 6/0/6301   Procedure: FAT GRAFT APPLICATION;  Surgeon: Evelina Bucy, DPM;  Location: [place];  Service: Podiatry;  Laterality: Right;  . INGUINAL HERNIA REPAIR Left   . LEFT HEART CATH AND CORS/GRAFTS ANGIOGRAPHY N/A 04/27/2017   Procedure: LEFT HEART CATH AND CORS/GRAFTS ANGIOGRAPHY;  Surgeon: Leonie Man, MD;  Location: [place] [facility];  Service: Cardiovascular;  Laterality: N/A;  . LEFT HEART CATHETERIZATION WITH CORONARY ANGIOGRAM N/A 06/22/2014   Procedure: LEFT HEART CATHETERIZATION WITH CORONARY ANGIOGRAM;  Surgeon: Troy Sine, MD;  Location: [facility] C[facility];  Service: Cardiovascular;  Laterality: N/A;  . LOWER EXTREMITY ANGIOGRAM Left 12/11/2013   Procedure: LOWER EXTREMITY ANGIOGRAM;  Surgeon: Lorretta Harp, MD;  Location: [facility] C[facility] LAB;  Service: Cardiovascular;  Laterality: Left;  . LUMBAR LAMINECTOMY/DECOMPRESSION MICRODISCECTOMY Right 07/03/2017   Procedure: MICRODISCECTOMY LUMBAR FIVE - SACRAL ONE RIGHT;  Surgeon: Consuella Lose, MD;  Location: [place];  Service: Neurosurgery;  Laterality: Right;  . LUMBAR LAMINECTOMY/DECOMPRESSION MICRODISCECTOMY Right 10/19/2017   Procedure: MICRODISCECTOMY LUMBAR FIVE- SACRAL 1 ONE ;  Surgeon: Consuella Lose, MD;   Location: [place];  Service: Neurosurgery;  Laterality: Right;  . TONSILLECTOMY AND ADENOIDECTOMY    . WISDOM TOOTH EXTRACTION    . WOUND DEBRIDEMENT Right 05/13/2019   Procedure: DEBRIDEMENT WOUND;  Surgeon: Evelina Bucy, DPM;  Location: [facility];  Service: Podiatry;  Laterality: Right;   Family History  Problem Relation Age of Onset  . Heart attack Sister        died @ 49  . Cancer Mother        died @ 45; unknown type  . Diabetes Brother        deceased  . Cirrhosis Father        alcohol related  . Diabetes Father   . Esophageal cancer Neg Hx   . Colon cancer Neg Hx   . Pancreatic cancer Neg Hx   . Stomach cancer Neg Hx    Social History:  reports that he quit smoking about 45 years ago. His smoking use included cigarettes. He has a 2.00 pack-year smoking history. He has never used  smokeless tobacco. He reports previous drug use. Drug: Marijuana. He reports that he does not drink alcohol. Allergies:  Allergies  Allergen Reactions  . Kiwi Extract Itching, Swelling and Other (See Comments)    Lips and face swell- breathing not affected  . Flexeril [Cyclobenzaprine]     Hands become flimsy, can not hold things  . Tape Other (See Comments)    "Plastic" tape causes blisters!!   Medications Prior to Admission  Medication Sig Dispense Refill  . acetaminophen (TYLENOL) 500 MG tablet Take 1,000 mg by mouth every 4 (four) hours as needed for moderate pain or headache.     Marland Kitchen aspirin EC 81 MG tablet Take 1 tablet (81 mg total) by mouth daily.    Marland Kitchen atorvastatin (LIPITOR) 40 MG tablet Take 1 tablet (40 mg total) by mouth daily at 6 PM. 30 tablet 5  . calcium acetate (PHOSLO) 667 MG capsule Take 3 capsules (2,001 mg total) by mouth 3 (three) times daily with meals. (Patient taking differently: Take 1,334 mg by mouth 2 (two) times daily. ) 270 capsule 0  . clopidogrel (PLAVIX) 75 MG tablet Take 75 mg by mouth daily.    . clotrimazole (LOTRIMIN) 1 % cream Apply between  toes and under toes twice daily for 4 weeks. (Patient not taking: Reported on 11/13/2019) 30 g 1  . gabapentin (NEURONTIN) 100 MG capsule Take 100 mg by mouth 2 (two) times daily.     . lipase/protease/amylase (CREON) 36000 UNITS CPEP capsule Take 36,000 Units by mouth daily.    Marland Kitchen loperamide (IMODIUM) 2 MG capsule Take 2 mg by mouth as needed for diarrhea or loose stools.    . midodrine (PROAMATINE) 5 MG tablet Take 5 mg by mouth every Monday, Wednesday, and Friday. Take with Dialysis    . multivitamin (RENA-VIT) TABS tablet Take 1 tablet by mouth at bedtime. 30 tablet 0  . Naldemedine Tosylate (SYMPROIC PO) Take 1 tablet by mouth daily as needed.    . nitroGLYCERIN (NITROSTAT) 0.4 MG SL tablet Place 1 tablet (0.4 mg total) under the tongue every 5 (five) minutes as needed for chest pain. 25 tablet 3  . ONETOUCH ULTRA test strip     . oxyCODONE (OXY IR/ROXICODONE) 5 MG immediate release tablet Take 5 mg by mouth every 4 (four) hours as needed for severe pain.    . pantoprazole (PROTONIX) 40 MG tablet Take 40 mg by mouth daily.    . pentoxifylline (TRENTAL) 400 MG CR tablet Take 1 tablet (400 mg total) by mouth daily. 90 tablet 3  . polyethylene glycol (MIRALAX / GLYCOLAX) packet Take 17 g by mouth daily as needed for moderate constipation. 14 each 0  . silver sulfADIAZINE (SILVADENE) 1 % cream Apply 1 application topically daily. 50 g 0    Drug Regimen Review Drug regimen was reviewed and remains appropriate with no significant issues identified  Home: Home Living Family/patient expects to be discharged to:: Private residence Living Arrangements: Spouse/significant other, Children   Functional History:    Functional Status:  Mobility:          ADL:    Cognition: Cognition Orientation Level: (P) Oriented X4    Physical Exam: Blood pressure 136/70, pulse 73, temperature 98.3 F (36.8 C), temperature source Oral, resp. rate 18, SpO2 98 %. Physical Exam Vitals and nursing note  reviewed.  Constitutional:      Comments: Pt sitting up in bedside recliner, appropriate, NAD  HENT:     Head:  Comments: Smile equal- tongue midline    Right Ear: External ear normal.     Left Ear: External ear normal.     Nose: Nose normal. No congestion.     Mouth/Throat:     Mouth: Mucous membranes are dry.     Pharynx: Oropharynx is clear. No oropharyngeal exudate.  Eyes:     General:        Right eye: No discharge.        Left eye: No discharge.     Extraocular Movements: Extraocular movements intact.  Cardiovascular:     Comments: RR-no M/R/G Pulmonary:     Comments: CTA B/L- no W/R/R- good air movement Abdominal:     Comments: Distended- a little firm; protuberant; hypoactive BS  Musculoskeletal:     Cervical back: Normal range of motion. No rigidity.     Comments: UEs- 5/5 in deltoid, biceps, triceps, WE< grip and finger abd B/L LEs- LLE- 5/5 in HF, KE, KF, DF and PF RLE- HF 3+/5, KE 4-/5 KF 4-/5- couldn't tolerate checking DF and PF due to pain Has VAC on TMA with healing shoe in place  Skin:    Comments: Right transmetatarsal amputation site with wound VAC in place appropriately tender  VAC day 6/7 Thrill palpable and good in LUE  Neurological:     Comments: Patient is alert in no acute distress.  Follows commands.  Oriented x3.  Decreased sensation to light touch in RLE- otherwise intact in LLE and UEs  Psychiatric:     Comments: Appropriate- wincing in pain often     Results for orders placed or performed during the hospital encounter of 11/19/19 (from the past 48 hour(s))  Glucose, capillary     Status: Abnormal   Collection Time: 11/23/19  8:22 PM  Result Value Ref Range   Glucose-Capillary 165 (H) 70 - 99 mg/dL    Comment: Glucose reference range applies only to samples taken after fasting for at least 8 hours.   Comment 1 Document in Chart   Glucose, capillary     Status: None   Collection Time: 11/24/19  6:42 AM  Result Value Ref Range    Glucose-Capillary 93 70 - 99 mg/dL    Comment: Glucose reference range applies only to samples taken after fasting for at least 8 hours.   Comment 1 Document in Chart   Renal function panel     Status: Abnormal   Collection Time: 11/24/19  8:21 AM  Result Value Ref Range   Sodium 136 135 - 145 mmol/L   Potassium 5.3 (H) 3.5 - 5.1 mmol/L   Chloride 92 (L) 98 - 111 mmol/L   CO2 28 22 - 32 mmol/L   Glucose, Bld 94 70 - 99 mg/dL    Comment: Glucose reference range applies only to samples taken after fasting for at least 8 hours.   BUN 58 (H) 8 - 23 mg/dL   Creatinine, Ser 10.75 (H) 0.61 - 1.24 mg/dL   Calcium 8.8 (L) 8.9 - 10.3 mg/dL   Phosphorus 5.5 (H) 2.5 - 4.6 mg/dL   Albumin 2.9 (L) 3.5 - 5.0 g/dL   GFR calc non Af Amer 4 (L) >60 mL/min   GFR calc Af Amer 5 (L) >60 mL/min   Anion gap 16 (H) 5 - 15    Comment: Performed at [facility] [address], [place], [place] [address]  CBC     Status: Abnormal   Collection Time: 11/24/19  8:22 AM  Result Value Ref Range   WBC 9.1 4.0 - 10.5 K/uL   RBC 4.12 (L) 4.22 - 5.81 MIL/uL   Hemoglobin 12.4 (L) 13.0 - 17.0 g/dL   HCT 38.8 (L) 39 - 52 %   MCV 94.2 80.0 - 100.0 fL   MCH 30.1 26.0 - 34.0 pg   MCHC 32.0 30.0 - 36.0 g/dL   RDW 14.9 11.5 - 15.5 %   Platelets 168 150 - 400 K/uL   nRBC 0.0 0.0 - 0.2 %    Comment: Performed at [facility] [address], [place], [place] [address]  Glucose, capillary     Status: Abnormal   Collection Time: 11/24/19  4:04 PM  Result Value Ref Range   Glucose-Capillary 215 (H) 70 - 99 mg/dL    Comment: Glucose reference range applies only to samples taken after fasting for at least 8 hours.  Glucose, capillary     Status: Abnormal   Collection Time: 11/24/19  7:25 PM  Result Value Ref Range   Glucose-Capillary 141 (H) 70 - 99 mg/dL    Comment: Glucose reference range applies only to samples taken after fasting for at least 8 hours.   No results found.     Medical Problem List and  Plan: 1.  Decreased functional ability secondary to right transmetatarsal amputation 11/19/2019.  Wound VAC protocol to be completed 11/26/2019- TDWB on RLE  -patient may  Shower once they get VAC off- is day 6/7 of VAC  -ELOS/Goals:  Mod I 6-9 days 2.  Antithrombotics: -DVT/anticoagulation: SCD left lower extremity  -antiplatelet therapy: Aspirin 81 mg daily, Plavix 75 mg daily 3. Pain Management: Neurontin 100 mg twice daily, oxycodone as needed q4 hours prn 5-10 mg- might need more, was wincing in pain often- like hit by lightning 4. Mood: Provide emotional support  -antipsychotic agents: N/A 5. Neuropsych: This patient is capable of making decisions on his own behalf. 6. Skin/Wound Care: Routine skin checks 7. Fluids/Electrolytes/Nutrition: Routine in and outs with follow-up chemistries 8.  Acute on chronic anemia.  Follow-up CBC 9.  Orthostasis with HD.  Continue ProAmatine 5 mg Monday Wednesday Friday 10.  End-stage renal disease.  Hemodialysis per renal services-usually M/W/F- explained will be in afternoon; basically anuric- forms urine every 2-3 days 11.  Chronic constipation with acute constipation- LBM 7 days ago.  MiraLAX daily, Colace 100 mg twice daily   -ordered senokot 1 tab BID and added sorbitol now and daily prn 12.  CAD with CABG 2019.  Continue aspirin.  No chest pain or increasing shortness of breath 13. DM- not on meds since 2019- HD keeps it controlled it appears.    Lavon Paganini. [place], PA-C 11/25/19   I have personally performed a face to face diagnostic evaluation of this patient and formulated the key components of the plan.  Additionally, I have personally reviewed laboratory data, imaging studies, as well as relevant notes and concur with the physician assistant's documentation above.   The patient's status has not changed from the original H&P.  Any changes in documentation from the acute care chart have been noted above.     Courtney Heys, MD 11/25/2019

## 2019-11-25 NOTE — Progress Notes (Signed)
Patient arrived on unit, oriented to unit. Reviewed medications, therapy schedule, rehab routine and plan of care. States an understanding of information reviewed. No complications noted at this time. Patient reports no pain and is AX4 Jon Anderson L [name]  

## 2019-11-26 ENCOUNTER — Inpatient Hospital Stay (HOSPITAL_COMMUNITY): Payer: Medicare Other

## 2019-11-26 ENCOUNTER — Inpatient Hospital Stay (HOSPITAL_COMMUNITY): Payer: Medicare Other | Admitting: Physical Therapy

## 2019-11-26 DIAGNOSIS — Z89431 Acquired absence of right foot: Secondary | ICD-10-CM | POA: Diagnosis not present

## 2019-11-26 LAB — GLUCOSE, CAPILLARY: Glucose-Capillary: 135 mg/dL — ABNORMAL HIGH (ref 70–99)

## 2019-11-26 LAB — CBC
HCT: 36.7 % — ABNORMAL LOW (ref 39.0–52.0)
Hemoglobin: 12 g/dL — ABNORMAL LOW (ref 13.0–17.0)
MCH: 30.6 pg (ref 26.0–34.0)
MCHC: 32.7 g/dL (ref 30.0–36.0)
MCV: 93.6 fL (ref 80.0–100.0)
Platelets: 189 10*3/uL (ref 150–400)
RBC: 3.92 MIL/uL — ABNORMAL LOW (ref 4.22–5.81)
RDW: 14.8 % (ref 11.5–15.5)
WBC: 9.1 10*3/uL (ref 4.0–10.5)
nRBC: 0 % (ref 0.0–0.2)

## 2019-11-26 LAB — RENAL FUNCTION PANEL
Albumin: 3.1 g/dL — ABNORMAL LOW (ref 3.5–5.0)
Anion gap: 16 — ABNORMAL HIGH (ref 5–15)
BUN: 66 mg/dL — ABNORMAL HIGH (ref 8–23)
CO2: 30 mmol/L (ref 22–32)
Calcium: 9.1 mg/dL (ref 8.9–10.3)
Chloride: 86 mmol/L — ABNORMAL LOW (ref 98–111)
Creatinine, Ser: 10.66 mg/dL — ABNORMAL HIGH (ref 0.61–1.24)
GFR calc Af Amer: 5 mL/min — ABNORMAL LOW (ref >60)
GFR calc non Af Amer: 4 mL/min — ABNORMAL LOW (ref >60)
Glucose, Bld: 112 mg/dL — ABNORMAL HIGH (ref 70–99)
Phosphorus: 5.1 mg/dL — ABNORMAL HIGH (ref 2.5–4.6)
Potassium: 5.5 mmol/L — ABNORMAL HIGH (ref 3.5–5.1)
Sodium: 132 mmol/L — ABNORMAL LOW (ref 135–145)

## 2019-11-26 MED ORDER — SODIUM CHLORIDE 0.9 % IV SOLN: 100.0000 mL | INTRAVENOUS | Status: DC | PRN

## 2019-11-26 MED ORDER — LIDOCAINE HCL (PF) 1 % IJ SOLN: 5.0000 mL | INTRAMUSCULAR | Status: DC | PRN

## 2019-11-26 MED ORDER — ALTEPLASE 2 MG IJ SOLR: 2.0000 mg | Freq: Once | INTRAMUSCULAR | Status: DC | PRN

## 2019-11-26 MED ORDER — PENTAFLUOROPROP-TETRAFLUOROETH EX AERO: 1.0000 "application " | INHALATION_SPRAY | CUTANEOUS | Status: DC | PRN

## 2019-11-26 MED ORDER — LIDOCAINE-PRILOCAINE 2.5-2.5 % EX CREA: 1.0000 "application " | TOPICAL_CREAM | CUTANEOUS | Status: DC | PRN

## 2019-11-26 MED ORDER — HEPARIN SODIUM (PORCINE) 1000 UNIT/ML DIALYSIS: 1000.0000 [IU] | INTRAMUSCULAR | Status: DC | PRN

## 2019-11-26 NOTE — Progress Notes (Signed)
  [facility] Progress Note   Subjective:  Seen in room - moved to CIR yesterday, working with PT this morning. No CP/dyspnea overnight. Feels well. For HD later today.  Objective Vitals:   11/25/19 1653 11/25/19 1726 11/25/19 1955 11/26/19 0412  BP:  136/70 132/74 (!) 143/68  Pulse:  73 82 95  Resp:  18 18 16   Temp:  98.3 F (36.8 C) 98.7 F (37.1 C) 99 F (37.2 C)  TempSrc:  Oral Oral   SpO2:   100% 98%  Weight:  76 kg    Height: 5\' 11"  (1.803 m) 5\' 11"  (1.803 m)     Physical Exam General: Well appearing man, NAD Heart: RRR; no murmur Lungs: CTAB, no rales Abdomen: soft, non-tender Extremities: No LLE edema, R foot wrapped + wound vac in place Dialysis Access:   Additional Objective Labs: Basic Metabolic Panel: Recent Labs  Lab 11/20/19 0721 11/21/19 0943 11/24/19 0821  NA 132* 137 136  K 6.2* 4.4 5.3*  CL 92* 97* 92*  CO2 25 28 28   GLUCOSE 172* 133* 94  BUN 53* 32* 58*  CREATININE 11.84* 8.08* 10.75*  CALCIUM 8.9 8.5* 8.8*  PHOS 3.3 4.8* 5.5*   Liver Function Tests: Recent Labs  Lab 11/20/19 0721 11/21/19 0943 11/24/19 0821  ALBUMIN 3.1* 2.9* 2.9*   CBC: Recent Labs  Lab 11/20/19 0721 11/21/19 0943 11/24/19 0822  WBC 10.3 9.3 9.1  HGB 13.0 12.5* 12.4*  HCT 41.5 40.3 38.8*  MCV 93.7 94.4 94.2  PLT 181 164 168   Medications:  . aspirin EC  81 mg Oral Daily  . atorvastatin  40 mg Oral q1800  . calcitRIOL  1.25 mcg Oral Q M,W,F-HD  . calcium acetate  1,334 mg Oral BID WC  . Chlorhexidine Gluconate Cloth  6 each Topical Q0600  . clopidogrel  75 mg Oral Daily  . docusate sodium  100 mg Oral BID  . feeding supplement (NEPRO CARB STEADY)  237 mL Oral BID BM  . gabapentin  100 mg Oral BID  . lipase/protease/amylase  36,000 Units Oral Daily  . midodrine  5 mg Oral Q M,W,F-HD  . multivitamin  1 tablet Oral QHS  . pantoprazole  40 mg Oral Daily  . polyethylene glycol  17 g Oral Daily  . senna  1 tablet Oral BID    Dialysis  Orders: MWF @ [facility] 4hr, 450/800, EDW 71.5kg, 2K/2Ca, AVF, UFP #2, heparin 5000 - Parsabiv 2.66mcg IV q HD - Calcitriol 1.73mcg PO q HD  Assessment/Plan: 1. 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 Wound vac in place.Now in CIR. 2. ESRD:Continue usual MWF schedule - HD today (8/18). 3. Hypotension/volume:BPlow/stable on admission, uses midodrine pre-HD. About 5kg up per weights - ^ UF with next HD. 4. Anemia:Hgb>12, no ESA indicated. 5. Metabolic bone disease:Ca/Phosat goal- continue home binders and calcitriol. Parasbiv not on formulary, follow calcium, labs pending 6. Nutrition: Alb low -continueNepro protein supps. 7. T2DM 8. CAD   Veneta Penton, Hershal Coria 11/26/2019, 8:55 AM  [employer]

## 2019-11-26 NOTE — Progress Notes (Signed)
Inpatient Rehabilitation  Patient information reviewed and entered into eRehab system by Wyoma Genson M. Faylynn Stamos, M.A., CCC/SLP, PPS Coordinator.  Information including medical coding, functional ability and quality indicators will be reviewed and updated through discharge.    

## 2019-11-26 NOTE — Progress Notes (Signed)
[place] PHYSICAL MEDICINE & REHABILITATION PROGRESS NOTE   Subjective/Complaints: Pharmacy has contacted ortho to see if [place] should be resumed.  No complaints Denies pain, constipation, vomiting, nausea, fever, chills. Excited for therapy.   ROS: Denies pain, constipation, vomiting, nausea, fever, chills  Objective:   No results found. Recent Labs    11/24/19 0822  WBC 9.1  HGB 12.4*  HCT 38.8*  PLT 168   Recent Labs    11/24/19 0821  NA 136  K 5.3*  CL 92*  CO2 28  GLUCOSE 94  BUN 58*  CREATININE 10.75*  CALCIUM 8.8*    Intake/Output Summary (Last 24 hours) at 11/26/2019 0814 Last data filed at 11/25/2019 2042 Gross per 24 hour  Intake 220 ml  Output 0 ml  Net 220 ml     Physical Exam: Vital Signs Blood pressure (!) 143/68, pulse 95, temperature 99 F (37.2 C), resp. rate 16, height 5\' 11"  (1.803 m), weight 76 kg, SpO2 98 %. General: Alert and oriented x 3, No apparent distress HEENT: Head is normocephalic, atraumatic, PERRLA, EOMI, sclera anicteric, oral mucosa pink and moist, dentition intact, ext ear canals clear,  Neck: Supple without JVD or lymphadenopathy Cardiovascular:     Comments: RR-no M/R/G Pulmonary:     Comments: CTA B/L- no W/R/R- good air movement Abdominal:     Comments: Distended- a little firm; protuberant; hypoactive BS  Musculoskeletal:     Cervical back: Normal range of motion. No rigidity.     Comments: UEs- 5/5 in deltoid, biceps, triceps, WE< grip and finger abd B/L LEs- LLE- 5/5 in HF, KE, KF, DF and PF RLE- HF 3+/5, KE 4-/5 KF 4-/5- couldn't tolerate checking DF and PF due to pain Has VAC on TMA with healing shoe in place  Skin:    Comments: Right transmetatarsal amputation site with wound VAC in place appropriately tender  VAC day 6/7 Thrill palpable and good in LUE  Neurological:     Comments: Patient is alert in no acute distress.  Follows commands.  Oriented x3.  Decreased sensation to light touch in RLE-  otherwise intact in LLE and UEs  Psychiatric:     Comments: Appropriate- wincing in pain often   Assessment/Plan: 1. Functional deficits secondary to s/p transmetatarsal amputation of right foot, which require 3+ hours per day of interdisciplinary therapy in a comprehensive inpatient rehab setting.  Physiatrist is providing close team supervision and 24 hour management of active medical problems listed below.  Physiatrist and rehab team continue to assess barriers to discharge/monitor patient progress toward functional and medical goals  Care Tool:  Bathing              Bathing assist       Upper Body Dressing/Undressing Upper body dressing        Upper body assist      Lower Body Dressing/Undressing Lower body dressing            Lower body assist       Toileting Toileting    Toileting assist Assist for toileting: Supervision/Verbal cueing     Transfers Chair/bed transfer  Transfers assist     Chair/bed transfer assist level: Minimal Assistance - Patient > 75%     Locomotion Ambulation   Ambulation assist              Walk 10 feet activity   Assist           Walk 50 feet activity   Assist  Walk 150 feet activity   Assist           Walk 10 feet on uneven surface  activity   Assist           Wheelchair     Assist               Wheelchair 50 feet with 2 turns activity    Assist            Wheelchair 150 feet activity     Assist          Blood pressure (!) 143/68, pulse 95, temperature 99 F (37.2 C), resp. rate 16, height 5\' 11"  (1.803 m), weight 76 kg, SpO2 98 %.    Medical Problem List and Plan: 1.  Decreased functional ability secondary to right transmetatarsal amputation 11/19/2019.  Wound VAC protocol to be completed 11/26/2019- TDWB on RLE             -patient may  Shower once they get VAC off- is day 6/7 of VAC             -ELOS/Goals:  Mod I 6-9 days  -Initial  CIR evals today 2.  Antithrombotics: -DVT/anticoagulation: SCD left lower extremity             -antiplatelet therapy: Aspirin 81 mg daily, Plavix 75 mg daily 3. Pain Management: Neurontin 100 mg twice daily, oxycodone as needed q4 hours prn 5-10 mg- might need more, was wincing in pain often- like hit by lightning 4. Mood: Provide emotional support             -antipsychotic agents: N/A 5. Neuropsych: This patient is capable of making decisions on his own behalf. 6. Skin/Wound Care: Routine skin checks 7. Fluids/Electrolytes/Nutrition: Routine in and outs with follow-up chemistries 8.  Acute on chronic anemia.  f/u CBC with dialysis labs.  9.  Orthostasis with HD.  Continue ProAmatine 5 mg Monday Wednesday Friday 10.  End-stage renal disease.  Hemodialysis per renal services-usually M/W/F- explained will be in afternoon; basically anuric- forms urine every 2-3 days. F/u creatinine with dialysis labs.  11.  Chronic constipation with acute constipation- LBM 7 days ago.  MiraLAX daily, Colace 100 mg twice daily              -ordered senokot 1 tab BID and added sorbitol now and daily prn 12.  CAD with CABG 2019.  Continue aspirin.  No chest pain or increasing shortness of breath 13. DM- not on meds since 2019- HD keeps it controlled it appears. CBGs have been labile- continue to monitor.   LOS: 1 days A FACE TO FACE EVALUATION WAS PERFORMED  Martha Clan P Angie Piercey 11/26/2019, 8:14 AM

## 2019-11-26 NOTE — Evaluation (Addendum)
Occupational Therapy Assessment and Plan  Patient Details  Name: Jon Anderson MRN: [id number] Date of Birth: 05/23/1950  OT Diagnosis: acute pain, muscle weakness (generalized) and R transmetatarsal amputation Rehab Potential: Rehab Potential (ACUTE ONLY): Good ELOS: 5-7 days   Today's Date: 11/26/2019 OT Individual Time: 1962-2297 OT Individual Time Calculation (min): 70 min     Hospital Problem: Principal Problem:   S/P transmetatarsal amputation of foot, right ([place]) Active Problems:   CAD (coronary artery disease) nonobstructive per cath 2012   ESRD (end stage renal disease) on dialysis [facility])   Right below-knee amputee [facility])   Past Medical History:  Past Medical History:  Diagnosis Date  . Anemia of chronic disease   . CAD (coronary artery disease)    a.  Myoview 4/11: EF 53%, no scar or ischemia   c. MV 2012 Nl perfusion, apical thinning.  No ischemia or scar.  EF 49%, appears greater by visual estimate.;  d.  Dob stress echo 12/13:  Negative Dob stress echo. There is no evidence of ischemia.  The LVF is normal. b. Normal cors 2016.  . Carotid stenosis    a. <98% RICA, >92% LICA by duplex 04/1939  . Chronic chest pain    occ  . Constipation    chronic  . Dyspnea    with extertion  . ESRD (end stage renal disease) on dialysis [facility])    M-W-F- [name]  . GERD (gastroesophageal reflux disease)   . History of kidney stones    Passed  . HNP (herniated nucleus pulposus), lumbar   . HTN (hypertension)    echo 3/10: EF 60%, LAE  . Hyperlipidemia   . Nephrolithiasis    "passed them all"  . Peripheral arterial disease ([place])    a. s/p PTCA  right   . Pneumonia yrs ago  . Restless legs   . Sciatic leg pain   . Snores    a. presumed OSA, pt has refused sleep eval in past.  . Type II diabetes mellitus ([place])    no longer on medications, checks blood glucose at home  . Urinary frequency    Past Surgical History:  Past Surgical History:  Procedure Laterality Date  .  ABDOMINAL AORTOGRAM W/LOWER EXTREMITY Bilateral 08/21/2019   Procedure: ABDOMINAL AORTOGRAM W/LOWER EXTREMITY;  Surgeon: Waynetta Sandy, MD;  Location: [place] CV LAB;  Service: Cardiovascular;  Laterality: Bilateral;  . AMPUTATION Right 07/25/2019   Procedure: RIGHT 5th RAY AMPUTATION;  Surgeon: Newt Minion, MD;  Location: [place];  Service: Orthopedics;  Laterality: Right;  . AMPUTATION Right 11/19/2019   Procedure: RIGHT TRANSMETATARSAL AMPUTATION;  Surgeon: Newt Minion, MD;  Location: [place];  Service: Orthopedics;  Laterality: Right;  . ANGIOPLASTY / STENTING FEMORAL Left 12/11/2013   dr berry  . AV FISTULA PLACEMENT Left 03/19/2014   Procedure: CREATION OF ARTERIOVENOUS (AV) FISTULA  LEFT UPPER ARM;  Surgeon: Mal Misty, MD;  Location: [place];  Service: Vascular;  Laterality: Left;  . BACK SURGERY  01/2018   screws placed   . CARDIAC CATHETERIZATION  2001 and 2010   . COLONOSCOPY W/ BIOPSIES AND POLYPECTOMY    . COLONOSCOPY WITH PROPOFOL N/A 08/01/2016   Procedure: COLONOSCOPY WITH PROPOFOL;  Surgeon: Carol Ada, MD;  Location: [facility] ENDOSCOPY;  Service: Endoscopy;  Laterality: N/A;  . CORONARY ARTERY BYPASS GRAFT  2019   [facility] x [facility] bypass  . ESOPHAGOGASTRODUODENOSCOPY (EGD) WITH PROPOFOL N/A 08/01/2016   Procedure: ESOPHAGOGASTRODUODENOSCOPY (EGD) WITH PROPOFOL;  Surgeon: Carol Ada, MD;  Location: [facility];  Service: Endoscopy;  Laterality: N/A;  . FOOT FRACTURE SURGERY Right    ligament repair  . FRACTURE SURGERY     left forearm  . GRAFT APPLICATION Right 12/13/6385   Procedure: FAT GRAFT APPLICATION;  Surgeon: Evelina Bucy, DPM;  Location: [place];  Service: Podiatry;  Laterality: Right;  . INGUINAL HERNIA REPAIR Left   . LEFT HEART CATH AND CORS/GRAFTS ANGIOGRAPHY N/A 04/27/2017   Procedure: LEFT HEART CATH AND CORS/GRAFTS ANGIOGRAPHY;  Surgeon: Leonie Man, MD;  Location: [place] CV LAB;  Service: Cardiovascular;  Laterality:  N/A;  . LEFT HEART CATHETERIZATION WITH CORONARY ANGIOGRAM N/A 06/22/2014   Procedure: LEFT HEART CATHETERIZATION WITH CORONARY ANGIOGRAM;  Surgeon: Troy Sine, MD;  Location: [facility] CATH LAB;  Service: Cardiovascular;  Laterality: N/A;  . LOWER EXTREMITY ANGIOGRAM Left 12/11/2013   Procedure: LOWER EXTREMITY ANGIOGRAM;  Surgeon: Lorretta Harp, MD;  Location: [facility] CATH LAB;  Service: Cardiovascular;  Laterality: Left;  . LUMBAR LAMINECTOMY/DECOMPRESSION MICRODISCECTOMY Right 07/03/2017   Procedure: MICRODISCECTOMY LUMBAR FIVE - SACRAL ONE RIGHT;  Surgeon: Consuella Lose, MD;  Location: [place];  Service: Neurosurgery;  Laterality: Right;  . LUMBAR LAMINECTOMY/DECOMPRESSION MICRODISCECTOMY Right 10/19/2017   Procedure: MICRODISCECTOMY LUMBAR FIVE- SACRAL 1 ONE ;  Surgeon: Consuella Lose, MD;  Location: [place];  Service: Neurosurgery;  Laterality: Right;  . TONSILLECTOMY AND ADENOIDECTOMY    . WISDOM TOOTH EXTRACTION    . WOUND DEBRIDEMENT Right 05/13/2019   Procedure: DEBRIDEMENT WOUND;  Surgeon: Evelina Bucy, DPM;  Location: [facility];  Service: Podiatry;  Laterality: Right;    Assessment & Plan Clinical Impression: Jon Anderson is a 69 year old right-handed male with history of CAD/CABG 2019, end-stage renal disease with hemodialysis Monday Wednesday Friday on [employer],, type 2 diabetes mellitus hyperlipidemia, chronic constipation, peripheral vascular disease with right fifth 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  Per chart review patient lives with spouse independent with assistive device and still drives.  1 level home 3 steps to entry.  Presented 11/19/2019 with ulceration of the fourth metatarsal and tip of the right great toe.  No change with conservative care and patient underwent right transmetatarsal amputation 11/19/2019 per Dr. Sharol Given.  Wound VAC as directed.  Hemodialysis ongoing as per renal services.  Patient remains on  aspirin and Plavix as prior to admission.  Therapy evaluations completed and patient was admitted for a comprehensive rehab program.  Patient transferred to CIR on 11/25/2019 .    Patient currently requires min with basic self-care skills secondary to muscle weakness and decreased standing balance and decreased balance strategies.  Prior to hospitalization, patient could complete ADLs with modified independent .  Patient will benefit from skilled intervention to decrease level of assist with basic self-care skills and increase level of independence with iADL prior to discharge home with care partner.  Anticipate patient will require intermittent supervision and follow up home health.  OT - End of Session Activity Tolerance: Tolerates 10 - 20 min activity with multiple rests Endurance Deficit: Yes Endurance Deficit Description: generalized weakness OT Assessment Rehab Potential (ACUTE ONLY): Good OT Barriers to Discharge: Inaccessible home environment OT Barriers to Discharge Comments: 3 stairs to enter, no railing OT Patient demonstrates impairments in the following area(s): Balance;Endurance;Motor;Pain;Skin Integrity OT Basic ADL's Functional Problem(s): Bathing;Dressing;Toileting OT Transfers Functional Problem(s): Toilet;Tub/Shower OT Additional Impairment(s): None OT Plan OT Intensity: Minimum of 1-2 x/day, 45 to 90  minutes OT Frequency: 5 out of 7 days OT Duration/Estimated Length of Stay: 7-10 days OT Treatment/Interventions: Balance/vestibular training;DME/adaptive equipment instruction;Patient/family education;Therapeutic Activities;Wheelchair propulsion/positioning;Therapeutic Exercise;Psychosocial support;Functional mobility training;Self Care/advanced ADL retraining;Discharge planning;Skin care/wound managment;Pain management;Disease mangement/prevention OT Self Feeding Anticipated Outcome(s): no goal set OT Basic Self-Care Anticipated Outcome(s): mod I OT Toileting Anticipated  Outcome(s): mod I OT Bathroom Transfers Anticipated Outcome(s): (S) OT Recommendation Patient destination: Home Follow Up Recommendations: Home health OT Equipment Recommended: Tub/shower bench   OT Evaluation Precautions/Restrictions  Precautions Precautions: Fall Precaution Comments: wound vac, R foot Restrictions Weight Bearing Restrictions: Yes RLE Weight Bearing: Touchdown weight bearing Other Position/Activity Restrictions: wound vac General Chart Reviewed: Yes Family/Caregiver Present: No  Pain Pain Assessment Pain Scale: 0-10 Pain Score: 0-No pain Pain Type: Acute pain;Surgical pain Home Living/Prior Functioning Home Living Family/patient expects to be discharged to:: Private residence Living Arrangements: Spouse/significant other, Children Available Help at Discharge: Family, Available 24 hours/day Type of Home: House Home Access: Stairs to enter Technical brewer of Steps: 3 Entrance Stairs-Rails: None Home Layout: One level Bathroom Shower/Tub: Tub/shower unit, Architectural technologist: Handicapped height Bathroom Accessibility: Yes  Lives With: Spouse, Daughter IADL History Homemaking Responsibilities: Yes Meal Prep Responsibility: Secondary Laundry Responsibility: Secondary Cleaning Responsibility: Secondary Bill Paying/Finance Responsibility: Secondary Current License: Yes Mode of Transportation: Car Prior Function Level of Independence: Independent with basic ADLs, Independent with homemaking with ambulation, Independent with gait, Requires assistive device for independence (cane or walker) Driving: Yes Vocation: Retired Comments: ambulates with use of a cane, drives, retired Psychologist, occupational Baseline Vision/History: Wears glasses Wears Glasses: Reading only Patient Visual Report: No change from baseline Vision Assessment?: No apparent visual deficits Perception  Perception: Within Functional Limits Praxis Praxis:  Intact Cognition Overall Cognitive Status: Within Functional Limits for tasks assessed Arousal/Alertness: Awake/alert Orientation Level: Person;Place;Situation Person: Oriented Place: Oriented Situation: Oriented Year: 2021 Month: August Day of Week: Correct Memory: Appears intact Immediate Memory Recall: Sock;Blue;Bed Memory Recall Sock: With Cue Memory Recall Blue: Without Cue Memory Recall Bed: Without Cue Attention: Selective Selective Attention: Appears intact Awareness: Appears intact Problem Solving: Appears intact Safety/Judgment: Appears intact Sensation Sensation Light Touch: Appears Intact Hot/Cold: Appears Intact Proprioception: Appears Intact Coordination Gross Motor Movements are Fluid and Coordinated: No Fine Motor Movements are Fluid and Coordinated: Yes Coordination and Movement Description: generalized weakness and limited by TDWB RLE Motor  Motor Motor: Other (comment) Motor - Skilled Clinical Observations: generalized weakness  Trunk/Postural Assessment  Cervical Assessment Cervical Assessment: Exceptions to [facility] (forward head) Thoracic Assessment Thoracic Assessment: Exceptions to [facility] (rounded shoulders) Lumbar Assessment Lumbar Assessment: Within Functional Limits Postural Control Postural Control: Deficits on evaluation Righting Reactions: delayed Protective Responses: delayed  Balance Balance Balance Assessed: Yes Static Sitting Balance Static Sitting - Balance Support: No upper extremity supported Static Sitting - Level of Assistance: 6: Modified independent (Device/Increase time) Dynamic Sitting Balance Dynamic Sitting - Balance Support: No upper extremity supported Dynamic Sitting - Level of Assistance: 5: Stand by assistance Static Standing Balance Static Standing - Balance Support: Bilateral upper extremity supported;During functional activity Static Standing - Level of Assistance: 4: Min assist Dynamic Standing Balance Dynamic  Standing - Balance Support: Bilateral upper extremity supported Dynamic Standing - Level of Assistance: 4: Min assist Extremity/Trunk Assessment RUE Assessment RUE Assessment: Within Functional Limits LUE Assessment LUE Assessment: Within Functional Limits  Care Tool Care Tool Self Care Eating   Eating Assist Level: Independent    Oral Care    Oral Care Assist Level: Set up assist  Bathing   Body parts bathed by patient: Right arm;Left arm;Chest;Abdomen;Front perineal area;Buttocks;Right upper leg;Left upper leg;Right lower leg;Left lower leg;Face     Assist Level: Minimal Assistance - Patient > 75%    Upper Body Dressing(including orthotics)   What is the patient wearing?: Pull over shirt;Button up shirt   Assist Level: Set up assist    Lower Body Dressing (excluding footwear)   What is the patient wearing?: Underwear/pull up;Pants Assist for lower body dressing: Minimal Assistance - Patient > 75%    Putting on/Taking off footwear   What is the patient wearing?: Non-skid slipper socks Assist for footwear: Moderate Assistance - Patient 50 - 74%       Care Tool Toileting Toileting activity   Assist for toileting: Minimal Assistance - Patient > 75%     Care Tool Bed Mobility Roll left and right activity   Roll left and right assist level: Supervision/Verbal cueing    Sit to lying activity   Sit to lying assist level: Supervision/Verbal cueing    Lying to sitting edge of bed activity   Lying to sitting edge of bed assist level: Supervision/Verbal cueing     Care Tool Transfers Sit to stand transfer   Sit to stand assist level: Minimal Assistance - Patient > 75%    Chair/bed transfer   Chair/bed transfer assist level: Minimal Assistance - Patient > 75%     Toilet transfer   Assist Level: Minimal Assistance - Patient > 75%     Care Tool Cognition Expression of Ideas and Wants Expression of Ideas and Wants: Some difficulty - exhibits some difficulty with  expressing needs and ideas (e.g, some words or finishing thoughts) or speech is not clear   Understanding Verbal and Non-Verbal Content Understanding Verbal and Non-Verbal Content: Understands (complex and basic) - clear comprehension without cues or repetitions   Memory/Recall Ability *first 3 days only Memory/Recall Ability *first 3 days only: Current season;That he or she is in a hospital/hospital unit    Refer to Care Plan for [place] 1 OT Short Term Goal 1 (Week 1): STG= LTG d/t ELOS   Skilled OT evaluation completed. Edu provided re OT POC, rehab expectations, ELOS, and general limb loss education. Pt completed ADLs at sink level, with intervention completed as reported below. Pt was left sitting up on the toilet- NT alerted to pt location.   Recommendations for other services: None    Skilled Therapeutic Intervention ADL ADL Eating: Independent Where Assessed-Eating: Edge of bed Grooming: Setup Where Assessed-Grooming: Wheelchair Upper Body Bathing: Supervision/safety Where Assessed-Upper Body Bathing: Sitting at sink Upper Body Dressing: Supervision/safety Where Assessed-Upper Body Dressing: Sitting at sink Lower Body Dressing: Minimal assistance;Minimal cueing Where Assessed-Lower Body Dressing: Standing at sink;Sitting at sink Toileting: Minimal assistance Where Assessed-Toileting: Bedside Commode Toilet Transfer: Minimal assistance Toilet Transfer Method: Stand pivot Toilet Transfer Equipment: Bedside commode Mobility  Bed Mobility Bed Mobility: Supine to Sit;Sit to Supine Supine to Sit: Supervision/Verbal cueing Sit to Supine: Supervision/Verbal cueing Transfers Sit to Stand: Minimal Assistance - Patient > 75% Stand to Sit: Minimal Assistance - Patient > 75%   Discharge Criteria: Patient will be discharged from OT if patient refuses treatment 3 consecutive times without medical reason, if treatment goals not met, if there is a  change in medical status, if patient makes no progress towards goals or if patient is discharged from hospital.  The above assessment, treatment plan, treatment alternatives and goals were discussed and mutually  agreed upon: by patient  Curtis Sites 11/26/2019, 9:10 AM

## 2019-11-26 NOTE — Progress Notes (Signed)
Occupational Therapy Session Note  Patient Details  Name: Jon Anderson MRN: [id number] Date of Birth: Feb 04, 1951  Today's Date: 11/26/2019 OT Individual Time: 1100-1200 OT Individual Time Calculation (min): 60 min    Short Term Goals: Week 1:  OT Short Term Goal 1 (Week 1): STG= LTG d/t ELOS  Skilled Therapeutic Interventions/Progress Updates:  Pt received in w/c with no c/o pain, agreeable to OT session. Pt propelled w/c 100 ft with min cueing for technique and slow pace. Pt given demonstration re use of TTB. Pt agreeable to use but concerned with cost- CSW will be notified. Pt returned demo with min A. Cueing required re hand placement. Pt propelled w/c to the therapy gym and transferred to the mat with min A. Pt reporting he thinks his BP is low- it was checked and 122/69. Pt completed sit > stand from low mat and completed 2x 10 mini squats completed to improve LLE strength and endurance for transfers, with min cueing for TDWB precautions RLE. Pt then completed w/c mobility, propelling up a ramp and back down. Min A overall provided as well as min cueing for UE placement and control of descent. Pt provided with w/c gloves. Pt ended session with 8 min on the BUE ergometer to improve BUE strength needed to propel w/c over grass to enter his home. Pt returned to his room and was left with all needs met, chair alarm set.   Therapy Documentation Precautions:  Precautions Precautions: Fall Precaution Comments: wound vac, R foot Required Braces or Orthoses: Other Brace Other Brace: post op shoe RLE Restrictions Weight Bearing Restrictions: Yes RLE Weight Bearing: Touchdown weight bearing Other Position/Activity Restrictions: wound vac  Therapy/Group: Individual Therapy  Curtis Sites 11/26/2019, 11:26 AM

## 2019-11-26 NOTE — Progress Notes (Addendum)
Patient ID: CLETE KUCH, male   DOB: [birth year], 69 y.o.   MRN: [id number]  Per medical team, pt is progressing well, and d/c date is 8/24. Plans for wound vac to be removed by d/c date. D/c recs include: w/c and TTB, and HHPT/OT/?SN/?aide.  SW made efforts to meet with pt to discuss above and complete assessment. Pt off floor due to procedure- dialysis. SW will continue to make efforts to complete.   SW spoke with pt wife Stanton Kidney [phone]) to provide updates on above. She reported being concerned about if pt is able to do complete car transfers. Fam edu scheduled for Monday 8am-10am with pt wife and dtr LaReese. SW informed on DME company being [employer]. SW informed TTB is private purchase as not covered under insurance. No HHA preference.   SW sent HHPT/OT/?SN/?aide referral to [facility]. SW waiting on follow-up.  DME order: w/c and TTB sent to [employer] [facility] via [facility].   Loralee Pacas, MSW, [place] Office: [phone] Cell: [phone] Fax: [phone]

## 2019-11-26 NOTE — Care Management (Signed)
[place] Individual Statement of Services  Patient Name:  Jon Anderson  Date:  11/26/2019  Welcome to the [facility].  Our goal is to provide you with an individualized program based on your diagnosis and situation, designed to meet your specific needs.  With this comprehensive rehabilitation program, you will be expected to participate in at least 3 hours of rehabilitation therapies Monday-Friday, with modified therapy programming on the weekends.  Your rehabilitation program will include the following services:  Physical Therapy (PT), Occupational Therapy (OT), 24 hour per day rehabilitation nursing, Therapeutic Recreaction (TR), Psychology, Neuropsychology, Care Coordinator, Rehabilitation Medicine, Nutrition Services, Pharmacy Services and Other  Weekly team conferences will be held on Tuesdays to discuss your progress.  Your Inpatient Rehabilitation Care Coordinator will talk with you frequently to get your input and to update you on team discussions.  Team conferences with you and your family in attendance may also be held.  Expected length of stay: 5-7 days    Overall anticipated outcome: Supervision  Depending on your progress and recovery, your program may change. Your Inpatient Rehabilitation Care Coordinator will coordinate services and will keep you informed of any changes. Your Inpatient Rehabilitation Care Coordinator's name and contact numbers are listed  below.  The following services may also be recommended but are not provided by the [facility] will be made to provide these services after discharge if needed.  Arrangements include referral to agencies that provide these services.  Your insurance has been verified to be:  [facility] Medicare  Your primary doctor is:   Soledad Gerlach  Pertinent information will be shared with your doctor and your insurance company.  Inpatient Rehabilitation Care Coordinator:  Cathleen Corti [phone] or [phone]  Information discussed with and copy given to patient by: Rana Snare, 11/26/2019, 1:44 PM

## 2019-11-26 NOTE — Evaluation (Signed)
Physical Therapy Assessment and Plan  Patient Details  Name: Jon Anderson MRN: [id number] Date of Birth: 1950-07-24  PT Diagnosis: Abnormality of gait, Difficulty walking, Muscle weakness and Pain in R transmet amputation Rehab Potential: Good ELOS: 5-7 days   Today's Date: 11/26/2019 PT Individual Time: 6387-5643 PT Individual Time Calculation (min): 71 min    Hospital Problem: Principal Problem:   S/P transmetatarsal amputation of foot, right ([place]) Active Problems:   CAD (coronary artery disease) nonobstructive per cath 2012   ESRD (end stage renal disease) on dialysis [facility])   Right below-knee amputee [facility])   Past Medical History:  Past Medical History:  Diagnosis Date  . Anemia of chronic disease   . CAD (coronary artery disease)    a.  Myoview 4/11: EF 53%, no scar or ischemia   c. MV 2012 Nl perfusion, apical thinning.  No ischemia or scar.  EF 49%, appears greater by visual estimate.;  d.  Dob stress echo 12/13:  Negative Dob stress echo. There is no evidence of ischemia.  The LVF is normal. b. Normal cors 2016.  . Carotid stenosis    a. <32% RICA, >95% LICA by duplex 04/8839  . Chronic chest pain    occ  . Constipation    chronic  . Dyspnea    with extertion  . ESRD (end stage renal disease) on dialysis [facility])    M-W-F- [name]  . GERD (gastroesophageal reflux disease)   . History of kidney stones    Passed  . HNP (herniated nucleus pulposus), lumbar   . HTN (hypertension)    echo 3/10: EF 60%, LAE  . Hyperlipidemia   . Nephrolithiasis    "passed them all"  . Peripheral arterial disease ([place])    a. s/p PTCA  right   . Pneumonia yrs ago  . Restless legs   . Sciatic leg pain   . Snores    a. presumed OSA, pt has refused sleep eval in past.  . Type II diabetes mellitus ([place])    no longer on medications, checks blood glucose at home  . Urinary frequency    Past Surgical History:  Past Surgical History:  Procedure Laterality Date  . ABDOMINAL  AORTOGRAM W/LOWER EXTREMITY Bilateral 08/21/2019   Procedure: ABDOMINAL AORTOGRAM W/LOWER EXTREMITY;  Surgeon: Waynetta Sandy, MD;  Location: [place] [facility];  Service: Cardiovascular;  Laterality: Bilateral;  . AMPUTATION Right 07/25/2019   Procedure: RIGHT 5th RAY AMPUTATION;  Surgeon: Newt Minion, MD;  Location: [place];  Service: Orthopedics;  Laterality: Right;  . AMPUTATION Right 11/19/2019   Procedure: RIGHT TRANSMETATARSAL AMPUTATION;  Surgeon: Newt Minion, MD;  Location: [place];  Service: Orthopedics;  Laterality: Right;  . ANGIOPLASTY / STENTING FEMORAL Left 12/11/2013   dr berry  . AV FISTULA PLACEMENT Left 03/19/2014   Procedure: CREATION OF ARTERIOVENOUS (AV) FISTULA  LEFT UPPER ARM;  Surgeon: Mal Misty, MD;  Location: [place];  Service: Vascular;  Laterality: Left;  . BACK SURGERY  01/2018   screws placed   . CARDIAC CATHETERIZATION  2001 and 2010   . COLONOSCOPY W/ BIOPSIES AND POLYPECTOMY    . COLONOSCOPY WITH PROPOFOL N/A 08/01/2016   Procedure: COLONOSCOPY WITH PROPOFOL;  Surgeon: Carol Ada, MD;  Location: [facility];  Service: Endoscopy;  Laterality: N/A;  . CORONARY ARTERY BYPASS GRAFT  2019   [facility] x 1 bypass  . ESOPHAGOGASTRODUODENOSCOPY (EGD) WITH PROPOFOL N/A 08/01/2016   Procedure: ESOPHAGOGASTRODUODENOSCOPY (EGD) WITH PROPOFOL;  Surgeon: Carol Ada, MD;  Location: [facility];  Service: Endoscopy;  Laterality: N/A;  . FOOT FRACTURE SURGERY Right    ligament repair  . FRACTURE SURGERY     left forearm  . GRAFT APPLICATION Right 09/09/9507   Procedure: FAT GRAFT APPLICATION;  Surgeon: Evelina Bucy, DPM;  Location: [place];  Service: Podiatry;  Laterality: Right;  . INGUINAL HERNIA REPAIR Left   . LEFT HEART CATH AND CORS/GRAFTS ANGIOGRAPHY N/A 04/27/2017   Procedure: LEFT HEART CATH AND CORS/GRAFTS ANGIOGRAPHY;  Surgeon: Leonie Man, MD;  Location: [place] CV LAB;  Service: Cardiovascular;  Laterality: N/A;  .  LEFT HEART CATHETERIZATION WITH CORONARY ANGIOGRAM N/A 06/22/2014   Procedure: LEFT HEART CATHETERIZATION WITH CORONARY ANGIOGRAM;  Surgeon: Troy Sine, MD;  Location: [facility] CATH LAB;  Service: Cardiovascular;  Laterality: N/A;  . LOWER EXTREMITY ANGIOGRAM Left 12/11/2013   Procedure: LOWER EXTREMITY ANGIOGRAM;  Surgeon: Lorretta Harp, MD;  Location: [facility] C[facility] LAB;  Service: Cardiovascular;  Laterality: Left;  . LUMBAR LAMINECTOMY/DECOMPRESSION MICRODISCECTOMY Right 07/03/2017   Procedure: MICRODISCECTOMY LUMBAR FIVE - SACRAL ONE RIGHT;  Surgeon: Consuella Lose, MD;  Location: [place];  Service: Neurosurgery;  Laterality: Right;  . LUMBAR LAMINECTOMY/DECOMPRESSION MICRODISCECTOMY Right 10/19/2017   Procedure: MICRODISCECTOMY LUMBAR FIVE- SACRAL 1 ONE ;  Surgeon: Consuella Lose, MD;  Location: [place];  Service: Neurosurgery;  Laterality: Right;  . TONSILLECTOMY AND ADENOIDECTOMY    . WISDOM TOOTH EXTRACTION    . WOUND DEBRIDEMENT Right 05/13/2019   Procedure: DEBRIDEMENT WOUND;  Surgeon: Evelina Bucy, DPM;  [facility];  Service: Podiatry;  Laterality: Right;    Assessment & Plan Clinical Impression: Patient is a 69 y.o. year old male with history of CAD/CABG 2019, end-stage renal disease with hemodialysis Monday Wednesday Friday on [employer],, type 2 diabetes mellitus hyperlipidemia, chronic constipation, peripheral vascular disease with right fifth 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  Per chart review patient lives with spouse independent with assistive device and still drives.  1 level home 3 steps to entry.  Presented 11/19/2019 with ulceration of the fourth metatarsal and tip of the right great toe.  No change with conservative care and patient underwent right transmetatarsal amputation 11/19/2019 per Dr. Sharol Given.  Wound VAC as directed.  Hemodialysis ongoing as per renal services.  Patient remains on aspirin and Plavix as prior to  admission.  Therapy evaluations completed and patient was admitted for a comprehensive rehab program..  Patient transferred to CIR on 11/25/2019 .   Patient currently requires min with mobility secondary to muscle weakness, decreased cardiorespiratoy endurance, and decreased standing balance, decreased postural control and decreased balance strategies.  Prior to hospitalization, patient was modified independent  with mobility and lived with Spouse, Daughter in a House home.  Home access is 3Stairs to enter.  Patient will benefit from skilled PT intervention to maximize safe functional mobility, minimize fall risk and decrease caregiver burden for planned discharge home with 24 hour supervision.  Anticipate patient will benefit from follow up [facility] at discharge.  PT - End of Session Activity Tolerance: Tolerates 30+ min activity with multiple rests Endurance Deficit: Yes Endurance Deficit Description: generalized weakness PT Assessment Rehab Potential (ACUTE/IP ONLY): Good PT Patient demonstrates impairments in the following area(s): Balance;Pain;Safety;Endurance;Motor;Sensory;Skin Integrity;Edema PT Transfers Functional Problem(s): Bed Mobility;Bed to Chair;Car;Furniture PT Locomotion Functional Problem(s): Ambulation;Wheelchair Mobility;Stairs PT Plan PT Intensity: Minimum of 1-2 x/day ,45 to 90 minutes PT Frequency: 5 out of  7 days PT Duration Estimated Length of Stay: 5-7 days PT Treatment/Interventions: Ambulation/gait training;Discharge planning;DME/adaptive equipment instruction;Functional mobility training;Pain management;Psychosocial support;Splinting/orthotics;Therapeutic Activities;UE/LE Strength taining/ROM;Visual/perceptual remediation/compensation;Wheelchair propulsion/positioning;UE/LE Coordination activities;Therapeutic Exercise;Balance/vestibular training;Community reintegration;Disease management/prevention;Functional electrical stimulation;Neuromuscular re-education;Patient/family  education;Skin care/wound management;Stair training PT Transfers Anticipated Outcome(s): supervision with LRAD PT Locomotion Anticipated Outcome(s): supervision household ambulation, supervision w/c mobility PT Recommendation Follow Up Recommendations: Home health PT;24 hour supervision/assistance Patient destination: Home Equipment Details: 18x18 w/c   PT Evaluation Precautions/Restrictions Precautions Precautions: Fall Precaution Comments: wound vac, R foot Required Braces or Orthoses: Other Brace Other Brace: post op shoe RLE Restrictions Weight Bearing Restrictions: Yes RLE Weight Bearing: Touchdown weight bearing Other Position/Activity Restrictions: wound vac   General Chart Reviewed: Yes Additional Pertinent History: CAD, CABG 2019, carotid stenosis, ESRD on HD, DM2, HLD, PVD, chronic chest pain, DOE, lumbar HNP, restless leg, sciatica, chronic anemia Response to Previous Treatment: Patient with no complaints from previous session. Family/Caregiver Present: No   Pain Pt with behaviors demonstrating pain in RLE, but pt does not rate & declines asking for medication at this time. Rest breaks provided PRN.  Home Living/Prior Functioning Home Living Available Help at Discharge: Family;Available 24 hours/day Type of Home: House Home Access: Stairs to enter [employer] of Steps: 3 Entrance Stairs-Rails: None Home Layout: One level Bathroom Shower/Tub: Product/process development scientist: Handicapped height Bathroom Accessibility: Yes  Lives With: Spouse;Daughter Prior Function Level of Independence: Independent with basic ADLs;Independent with homemaking with ambulation;Independent with gait;Requires assistive device for independence (cane or walker) Driving: Yes Vocation: Retired Comments: ambulates with use of a cane, drives, retired custodian Pt does report he can use back entrance to house that is just a threshold entrance, grass to access back door.  PT recommends pt using this entrance due to weight bearing restrictions.   Vision/Perception  Wears glasses for reading only at baseline. No changes in baseline vision. Perception Perception: Within Functional Limits Praxis Praxis: Intact   Cognition Overall Cognitive Status: Within Functional Limits for tasks assessed Arousal/Alertness: Awake/alert Orientation Level: Oriented X4 Memory: Appears intact Awareness: Appears intact Problem Solving: Appears intact Safety/Judgment: Appears intact   Sensation Sensation Light Touch: Appears Intact Proprioception: Appears Intact Coordination Gross Motor Movements are Fluid and Coordinated: No Fine Motor Movements are Fluid and Coordinated: Yes Coordination and Movement Description: generalized weakness and limited by TDWB RLE   Motor  Motor Motor: Abnormal postural alignment and control Motor - Skilled Clinical Observations: generalized weakness   Trunk/Postural Assessment  Cervical Assessment Cervical Assessment: Exceptions to [facility] (forward head) Thoracic Assessment Thoracic Assessment: Exceptions to [facility] (rounded shoulders) Lumbar Assessment Lumbar Assessment: Exceptions to [facility] (posterior pelvic tilt) Postural Control Postural Control: Deficits on evaluation Righting Reactions: delayed Protective Responses: delayed   Balance Balance Balance Assessed: Yes Dynamic Standing Balance Dynamic Standing - Balance Support: Bilateral upper extremity supported;During functional activity Dynamic Standing - Level of Assistance: 4: Min assist   Extremity Assessment  RUE Assessment RUE Assessment: Within Functional Limits LUE Assessment LUE Assessment: Within Functional Limits RLE Assessment RLE Assessment: Within Functional Limits (ROM WFL, strength not tested) LLE Assessment LLE Assessment: Within Functional Limits  Care Tool Care Tool Bed Mobility Roll left and right activity   Roll left and right assist level:  Supervision/Verbal cueing    Sit to lying activity   Sit to lying assist level: Supervision/Verbal cueing Sit to lying assistive device comment: Side rails used   Lying to sitting edge of bed activity   Lying to sitting edge of bed assist level: Supervision/Verbal  cueing     Care Tool Transfers Sit to stand transfer   Sit to stand assist level: Minimal Assistance - Patient > 75%    Chair/bed transfer   Chair/bed transfer assist level: Minimal Assistance - Patient > 75%     Toilet transfer   Assist Level: Moderate Assistance - Patient 50 - 74%    Car transfer   Car transfer assist level: Minimal Assistance - Patient > 75%      Care Tool Locomotion Ambulation   Assist level: Minimal Assistance - Patient > 75% Assistive device: Walker-rolling Max distance: 20 ft  Walk 10 feet activity   Assist level: Minimal Assistance - Patient > 75% Assistive device: Walker-rolling   Walk 50 feet with 2 turns activity Walk 50 feet with 2 turns activity did not occur: Safety/medical concerns      Walk 150 feet activity Walk 150 feet activity did not occur: Safety/medical concerns      Walk 10 feet on uneven surfaces activity   Assist level: Minimal Assistance - Patient > 75%    Stairs Stair activity did not occur: Safety/medical concerns        Walk up/down 1 step activity Walk up/down 1 step or curb (drop down) activity did not occur: Safety/medical concerns     Walk up/down 4 steps activity did not occuR: Safety/medical concerns  Walk up/down 4 steps activity      Walk up/down 12 steps activity Walk up/down 12 steps activity did not occur: Safety/medical concerns      Pick up small objects from floor Pick up small object from the floor (from standing position) activity did not occur: Safety/medical concerns      Wheelchair Will patient use wheelchair at discharge?: Yes Type of Wheelchair: Manual   Wheelchair assist level: Supervision/Verbal cueing Max wheelchair distance:  150 ft  Wheel 50 feet with 2 turns activity   Assist Level: Supervision/Verbal cueing  Wheel 150 feet activity   Assist Level: Supervision/Verbal cueing    Refer to Care Plan for Long Term Goals  SHORT TERM GOAL WEEK 1 PT Short Term Goal 1 (Week 1): STG = LTG due to short ELOS.  Recommendations for other services: None   Skilled Therapeutic Intervention Pt received in w/c & agreeable to tx. PT educates pt on ELOS, daily therapy schedule, weekly team meetings & other CIR information. Discussed home set up with pt reporting he can use more level back entrance to home with PT recommending this due to weight bearing restrictions. Provided pt with 18x18 w/c & discussed anticipated DME (w/c for community use, RW for household mobility), f/u HHPT, & need to wear post op shoe. Pt transfers sit<>stand with min assist and ambulates 5 ft + 8 ft + 8 ft + 20 ft with RW & CGA with pt electing to maintain NWB RLE. Pt completes car transfer at sedan simulated height with min assist, w/c mobility with BUE & supervision. PT educates pt on w/c parts management during session. Pt negotiates uneven surface (2" mat on floor) with RW & min assist and mod assist to ascend mat. At end of session pt left in w/c with chair alarm donned & call bell in reach, wound vac plugged in.  Mobility Bed Mobility Bed Mobility: Supine to Sit;Sit to Supine Supine to Sit: Supervision/Verbal cueing Sit to Supine: Supervision/Verbal cueing Transfers Transfers: Sit to Stand;Stand to Sit Sit to Stand: Minimal Assistance - Patient > 75% Stand to Sit: Minimal Assistance - Patient > 75% Stand Pivot  Transfer Details: Verbal cues for precautions/safety;Verbal cues for safe use of DME/AE Transfer (Assistive device): Rolling walker  Locomotion  Gait Ambulation: Yes Gait Assistance: Contact Guard/Touching assist Gait Distance (Feet): 20 Feet Assistive device: Rolling walker Gait Gait: Yes Gait Pattern: Impaired Gait Pattern:  Decreased step length - left Gait velocity: hop to LLE, pt elects to maintain NWB RLE Stairs / Additional Locomotion Stairs: Yes (pt ascends 2" mat on floor with RW & mod assist with max cuing for technique) Wheelchair Mobility Wheelchair Mobility: Yes Wheelchair Assistance: Chartered loss adjuster: Both upper extremities Wheelchair Parts Management: Needs assistance (progressing to supervision with education during session) Distance: 150 ft   Discharge Criteria: Patient will be discharged from PT if patient refuses treatment 3 consecutive times without medical reason, if treatment goals not met, if there is a change in medical status, if patient makes no progress towards goals or if patient is discharged from hospital.  The above assessment, treatment plan, treatment alternatives and goals were discussed and mutually agreed upon: by patient  Waunita Schooner 11/26/2019, 12:49 PM

## 2019-11-27 ENCOUNTER — Inpatient Hospital Stay (HOSPITAL_COMMUNITY): Payer: Medicare Other

## 2019-11-27 ENCOUNTER — Inpatient Hospital Stay (HOSPITAL_COMMUNITY): Payer: Medicare Other | Admitting: Occupational Therapy

## 2019-11-27 DIAGNOSIS — Z89431 Acquired absence of right foot: Secondary | ICD-10-CM | POA: Diagnosis not present

## 2019-11-27 DIAGNOSIS — N186 End stage renal disease: Secondary | ICD-10-CM | POA: Diagnosis not present

## 2019-11-27 DIAGNOSIS — I739 Peripheral vascular disease, unspecified: Secondary | ICD-10-CM

## 2019-11-27 DIAGNOSIS — E119 Type 2 diabetes mellitus without complications: Secondary | ICD-10-CM | POA: Diagnosis not present

## 2019-11-27 MED ORDER — CHLORHEXIDINE GLUCONATE CLOTH 2 % EX PADS
6.0000 | MEDICATED_PAD | Freq: Every day | CUTANEOUS | Status: DC
  Administered 2019-11-27: 6 via TOPICAL

## 2019-11-27 NOTE — Progress Notes (Signed)
Occupational Therapy Session Note  Patient Details  Name: Jon Anderson MRN: [id number] Date of Birth: 1950-05-07  Today's Date: 11/27/2019 OT Individual Time: 5615-3794 OT Individual Time Calculation (min): 60 min   Short Term Goals: Week 1:  OT Short Term Goal 1 (Week 1): STG= LTG d/t ELOS  Skilled Therapeutic Interventions/Progress Updates:     Pt greeted upright in bed eating breakfast. Pt request OT return after he finishes eating. OT returned and pt with episode of nausea and gagging. Pt stated he wanted to get to the bathroom. Pt completed bed mobility with min A. Min A sit<>stand w/ RW,  Then ambulated to the bathroom w/ RW and CGA. Pt needed min A for balance when removing pants to sit on commode. Pt spent extended time on commode 2/2 loose BMs. Pt able to complete peri-care in sitting, then needed CGA for standing balance to pull up pants. Pt ambulated out of bathroom in similar fashion to the sink. Pt able to maintain standing to wash buttocks and peri-area with CGA for balance. Min A to thread pants 2/2 wound vac. Pt left seated in wc at end of session with safety belt on, call bell in reach, and needs met.    Therapy Documentation Precautions:  Precautions Precautions: Fall Precaution Comments: wound vac, R foot Required Braces or Orthoses: Other Brace Other Brace: post op shoe RLE Restrictions Weight Bearing Restrictions: Yes RLE Weight Bearing: Touchdown weight bearing Other Position/Activity Restrictions: wound vac  General: Missed 15 minutes of OT treatment session 2/2 eating breakfast Pain:     Therapy/Group: Individual Therapy  Valma Cava 11/27/2019, 8:00 AM

## 2019-11-27 NOTE — Progress Notes (Signed)
Patient Details  Name: Jon Anderson MRN: [id number] Date of Birth: [birth year]  Today's Date: 11/27/2019  Hospital Problems: Principal Problem:   S/P transmetatarsal amputation of foot, right [facility]) Active Problems:   CAD (coronary artery disease) nonobstructive per cath 2012   ESRD (end stage renal disease) on dialysis [facility])   Right below-knee amputee [facility])  Past Medical History:  Past Medical History:  Diagnosis Date  . Anemia of chronic disease   . CAD (coronary artery disease)    a.  Myoview 4/11: EF 53%, no scar or ischemia   c. MV 2012 Nl perfusion, apical thinning.  No ischemia or scar.  EF 49%, appears greater by visual estimate.;  d.  Dob stress echo 12/13:  Negative Dob stress echo. There is no evidence of ischemia.  The LVF is normal. b. Normal cors 2016.  . Carotid stenosis    a. <66% RICA, >44% LICA by duplex 0/3474  . Chronic chest pain    occ  . Constipation    chronic  . Dyspnea    with extertion  . ESRD (end stage renal disease) on dialysis [facility])    M-W-F- [name]  . GERD (gastroesophageal reflux disease)   . History of kidney stones    Passed  . HNP (herniated nucleus pulposus), lumbar   . HTN (hypertension)    echo 3/10: EF 60%, LAE  . Hyperlipidemia   . Nephrolithiasis    "passed them all"  . Peripheral arterial disease ([place] [employer] [place])    a. s/p PTCA  right   . Pneumonia yrs ago  . Restless legs   . Sciatic leg pain   . Snores    a. presumed OSA, pt has refused sleep eval in past.  . Type II diabetes mellitus ([place])    no longer on medications, checks blood glucose at home  . Urinary frequency    Past Surgical History:  Past Surgical History:  Procedure Laterality Date  . ABDOMINAL AORTOGRAM W/LOWER EXTREMITY Bilateral 08/21/2019   Procedure: ABDOMINAL AORTOGRAM W/LOWER EXTREMITY;  Surgeon: Waynetta Sandy, MD;  Location: [place] [facility];  Service: Cardiovascular;  Laterality: Bilateral;  . AMPUTATION Right 07/25/2019   Procedure:  RIGHT 5th RAY AMPUTATION;  Surgeon: Newt Minion, MD;  Location: [place];  Service: Orthopedics;  Laterality: Right;  . AMPUTATION Right 11/19/2019   Procedure: RIGHT TRANSMETATARSAL AMPUTATION;  Surgeon: Newt Minion, MD;  Location: [place];  Service: Orthopedics;  Laterality: Right;  . ANGIOPLASTY / STENTING FEMORAL Left 12/11/2013   dr berry  . AV FISTULA PLACEMENT Left 03/19/2014   Procedure: CREATION OF ARTERIOVENOUS (AV) FISTULA  LEFT UPPER ARM;  Surgeon: Mal Misty, MD;  Location: [place];  Service: Vascular;  Laterality: Left;  . BACK SURGERY  01/2018   screws placed   . CARDIAC CATHETERIZATION  2001 and 2010   . COLONOSCOPY W/ BIOPSIES AND POLYPECTOMY    . COLONOSCOPY WITH PROPOFOL N/A 08/01/2016   Procedure: COLONOSCOPY WITH PROPOFOL;  Surgeon: Carol Ada, MD;  Location: [facility];  Service: Endoscopy;  Laterality: N/A;  . CORONARY ARTERY BYPASS GRAFT  2019   [facility] x [facility] bypass  . ESOPHAGOGASTRODUODENOSCOPY (EGD) WITH PROPOFOL N/A 08/01/2016   Procedure: ESOPHAGOGASTRODUODENOSCOPY (EGD) WITH PROPOFOL;  Surgeon: Carol Ada, MD;  Location: [facility];  Service: Endoscopy;  Laterality: N/A;  . FOOT FRACTURE SURGERY Right    ligament repair  . FRACTURE SURGERY     left forearm  . GRAFT APPLICATION Right 05/15/9561  Procedure: FAT GRAFT APPLICATION;  Surgeon: Evelina Bucy, DPM;  Location: [facility];  Service: Podiatry;  Laterality: Right;  . INGUINAL HERNIA REPAIR Left   . LEFT HEART CATH AND CORS/GRAFTS ANGIOGRAPHY N/A 04/27/2017   Procedure: LEFT HEART CATH AND CORS/GRAFTS ANGIOGRAPHY;  Surgeon: Leonie Man, MD;  Location: [place] CV [facility];  Service: Cardiovascular;  Laterality: N/A;  . LEFT HEART CATHETERIZATION WITH CORONARY ANGIOGRAM N/A 06/22/2014   Procedure: LEFT HEART CATHETERIZATION WITH CORONARY ANGIOGRAM;  Surgeon: Troy Sine, MD;  Location: [facility] CATH LAB;  Service: Cardiovascular;  Laterality: N/A;  . LOWER EXTREMITY ANGIOGRAM Left  12/11/2013   Procedure: LOWER EXTREMITY ANGIOGRAM;  Surgeon: Lorretta Harp, MD;  Location: [facility] CATH LAB;  Service: Cardiovascular;  Laterality: Left;  . LUMBAR LAMINECTOMY/DECOMPRESSION MICRODISCECTOMY Right 07/03/2017   Procedure: MICRODISCECTOMY LUMBAR FIVE - SACRAL ONE RIGHT;  Surgeon: Consuella Lose, MD;  Location: [place];  Service: Neurosurgery;  Laterality: Right;  . LUMBAR LAMINECTOMY/DECOMPRESSION MICRODISCECTOMY Right 10/19/2017   Procedure: MICRODISCECTOMY LUMBAR FIVE- SACRAL 1 ONE ;  Surgeon: Consuella Lose, MD;  Location: [place];  Service: Neurosurgery;  Laterality: Right;  . TONSILLECTOMY AND ADENOIDECTOMY    . WISDOM TOOTH EXTRACTION    . WOUND DEBRIDEMENT Right 05/13/2019   Procedure: DEBRIDEMENT WOUND;  Surgeon: Evelina Bucy, DPM;  Location: [facility];  Service: Podiatry;  Laterality: Right;   Social History:  reports that he quit smoking about 45 years ago. His smoking use included cigarettes. He has a 2.00 pack-year smoking history. He has never used smokeless tobacco. He reports previous drug use. Drug: Marijuana. He reports that he does not drink alcohol.  Family / Support Systems Marital Status: Married How Long?: 43 years Patient Roles: Spouse, Parent Spouse/Significant Other: Stanton Kidney (wifE): [phone] Children: Two adult children. 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 dialysis). Education: high school grad Read: Yes Write: Yes Employment Status: Retired Date Retired/Disabled/Unemployed: 2015 Age Retired: 62 Public relations account executive Issues: Denies Guardian/Conservator: N/a    Abuse/Neglect Abuse/Neglect Assessment Can Be Completed: Yes Physical Abuse: Denies Verbal Abuse: Denies Sexual Abuse: Denies Exploitation of patient/patient's resources: Denies Self-Neglect: Denies  Emotional Status Pt's affect, behavior and adjustment status: Pt appears to be in good spirits. Recent Psychosocial Issues: Denies Psychiatric History: Denies Substance Abuse History: Denies; occasional marijuana use every few years.  Patient / Family Perceptions, Expectations & Goals Pt/Family understanding of illness & functional limitations: Pt and wife have general understanding of care needs Premorbid pt/family roles/activities: Independent Anticipated changes in roles/activities/participation: Assistance with ADLs/IADLs Pt/family expectations/goals: "to get back on my feet and be independent; not be a burden."  [employer]: None Premorbid Home Care/DME Agencies: None Transportation available at discharge: wife and dtr Resource referrals recommended: Neuropsychology  Discharge Planning Living Arrangements: Spouse/significant other, Children Support Systems: Spouse/significant other, Children Type of Residence: Private residence Insurance underwriter Resources: Multimedia programmer (specify) Sports administrator [employer]) Financial Resources: [place] Referred: No Living Expenses: Own Money Management: Spouse, Patient Does the patient have any problems obtaining your medications?: No Home Management: All members split housekeeping tasks Care Coordinator Barriers to Discharge: Decreased caregiver support, Lack of/limited family support Care Coordinator Anticipated Follow  Up Needs: HH/OP Expected length of stay: 5-7 days  Clinical Impression SW met with pt in room to introduce self, explain role, and discuss discharge process. Pt not a veteran. No HCPOA. DME: cane, RW, and BSC.   Kasie Leccese A Treyon Wymore 11/27/2019, 3:20 PM

## 2019-11-27 NOTE — Progress Notes (Addendum)
Patient ID: Jon Anderson, male   DOB: 06-06-1950, 69 y.o.   MRN: [id number]   SW sent [place] application ([phone]). SW emailed application to [employer] with [employer].  *SW received updates stating pt is pre-certified for transportation for next 30 days, and can call [phone] to schedule for services. SW informed pt.   HHPT/OT/?SN/?aide referral declined by branch per  [facility]. SW sent referral to Brit[facility] and waiting on follow-up.   Loralee Pacas, MSW, [place] Office: [phone] Cell: [phone] Fax: [phone]

## 2019-11-27 NOTE — Progress Notes (Signed)
Physical Therapy Session Note  Patient Details  Name: Jon Anderson MRN: [id number] Date of Birth: December 25, 1950  Today's Date: 11/27/2019 PT Individual Time: 1345-1445 PT Individual Time Calculation (min): 60 min   Short Term Goals: Week 1:  PT Short Term Goal 1 (Week 1): STG = LTG due to short ELOS.  Skilled Therapeutic Interventions/Progress Updates:    Pt received sitting in w/c, agreeable to PT session. Pt denies pain on arrival however RLE pain evolves to 6/10 with mobility. RN notified. Pt propelled manual w/c ~187ft on level surfaces in hallways, cues provided for hand placement further back on rims to assist with propulsion efficiency. Pt hopped 54ft + 66ft with CGA and RW on level surfaces, pt maintaining NWB RLE status despite TDWB orders, reports increased fatigue in arms after ambulation efforts. Pt performed 1x5 hops onto 6inch platform with minA and RW support, maintains appropriate NWB status on RLE, cues for sequencing/technique and assist with stabilizing RW for safety. After seated rest, pt then performed trials of standing LLE single leg stance without BUE support, able to maintain for ~5-8 seconds each effort. Pt then propelled himself back to his room in his w/c with supervision, ended session with belt alarm on, needs in reach, seated in w/c.   Therapy Documentation Precautions:  Precautions Precautions: Fall Precaution Comments: wound vac, R foot Required Braces or Orthoses: Other Brace Other Brace: post op shoe RLE Restrictions Weight Bearing Restrictions: Yes RLE Weight Bearing: Touchdown weight bearing Other Position/Activity Restrictions: wound vac Vital Signs: Therapy Vitals Temp: 98.3 F (36.8 C) Pulse Rate: 89 Resp: 18 BP: 107/68 Patient Position (if appropriate): Sitting Oxygen Therapy SpO2: 97 % O2 Device: Room Air Pain: Pain Assessment Pain Scale: 0-10 Pain Score: 7  Pain Type: Surgical pain Pain Location: Foot Pain Orientation:  Right Pain Onset: On-going Patients Stated Pain Goal: 3  Therapy/Group: Individual Therapy  Carrianne Hyun P Eliz Nigg  PT 11/27/2019, 3:51 PM

## 2019-11-27 NOTE — Progress Notes (Signed)
Patient ID: Jon Anderson, male   DOB: 1950-04-12, 69 y.o.   MRN: [id number]   Information regarding transportation given to pt to follow up with and schedule appointments 24 hr prior to when he needs transport to HD.

## 2019-11-27 NOTE — Progress Notes (Addendum)
[place] PHYSICAL MEDICINE & REHABILITATION PROGRESS NOTE   Subjective/Complaints: C/o loose stools. Nausea this morning when he woke up.   ROS: Patient denies fever, rash, sore throat, blurred vision, nausea, vomiting,   cough, shortness of breath or chest pain, joint or back pain, headache, or mood change.    Objective:   No results found. Recent Labs    11/26/19 1357  WBC 9.1  HGB 12.0*  HCT 36.7*  PLT 189   Recent Labs    11/26/19 1356  NA 132*  K 5.5*  CL 86*  CO2 30  GLUCOSE 112*  BUN 66*  CREATININE 10.66*  CALCIUM 9.1    Intake/Output Summary (Last 24 hours) at 11/27/2019 0908 Last data filed at 11/26/2019 1909 Gross per 24 hour  Intake 177 ml  Output 2440 ml  Net -2263 ml     Physical Exam: Vital Signs Blood pressure 114/62, pulse 99, temperature 99 F (37.2 C), resp. rate 14, height 5\' 11"  (1.803 m), weight 78.3 kg, SpO2 91 %. Constitutional: No distress . Vital signs reviewed. HEENT: EOMI, oral membranes moist Neck: supple Cardiovascular: RRR without murmur. No JVD    Respiratory/Chest: CTA Bilaterally without wheezes or rales. Normal effort    GI/Abdomen: BS +, non-tender, non-distended Ext: no clubbing, cyanosis, or edema, AVG with thrill Psych: pleasant and cooperative Musculoskeletal:     Cervical back: Normal range of motion. No rigidity.     Comments: UE 5/5. LLE 4/5. RLE limited by vac Skin:    Comments: Right transmetatarsal amputation site with wound VAC in place-cannister dry Neurological:     Comments: Alert and oriented x 3. Normal insight and awareness. Intact Memory. Normal language and speech. Cranial nerve exam unremarkable  Decreased sensation to light touch in RLE- otherwise intact in LLE and UEs    Assessment/Plan: 1. Functional deficits secondary to s/p transmetatarsal amputation of right foot, which require 3+ hours per day of interdisciplinary therapy in a comprehensive inpatient rehab setting.  Physiatrist is  providing close team supervision and 24 hour management of active medical problems listed below.  Physiatrist and rehab team continue to assess barriers to discharge/monitor patient progress toward functional and medical goals  Care Tool:  Bathing    Body parts bathed by patient: Right arm, Left arm, Chest, Abdomen, Front perineal area, Buttocks, Right upper leg, Left upper leg, Right lower leg, Left lower leg, Face         Bathing assist Assist Level: Minimal Assistance - Patient > 75%     Upper Body Dressing/Undressing Upper body dressing   What is the patient wearing?: Pull over shirt, Button up shirt    Upper body assist Assist Level: Set up assist    Lower Body Dressing/Undressing Lower body dressing      What is the patient wearing?: Underwear/pull up, Pants     Lower body assist Assist for lower body dressing: Minimal Assistance - Patient > 75%     Toileting Toileting    Toileting assist Assist for toileting: Minimal Assistance - Patient > 75%     Transfers Chair/bed transfer  Transfers assist     Chair/bed transfer assist level: Minimal Assistance - Patient > 75%     Locomotion Ambulation   Ambulation assist      Assist level: Minimal Assistance - Patient > 75% Assistive device: Walker-rolling Max distance: 20 ft   Walk 10 feet activity   Assist     Assist level: Minimal Assistance - Patient > 75% Assistive  device: Walker-rolling   Walk 50 feet activity   Assist Walk 50 feet with 2 turns activity did not occur: Safety/medical concerns         Walk 150 feet activity   Assist Walk 150 feet activity did not occur: Safety/medical concerns         Walk 10 feet on uneven surface  activity   Assist     Assist level: Minimal Assistance - Patient > 75%     Wheelchair     Assist Will patient use wheelchair at discharge?: Yes Type of Wheelchair: Manual    Wheelchair assist level: Supervision/Verbal cueing Max  wheelchair distance: 150 ft    Wheelchair 50 feet with 2 turns activity    Assist        Assist Level: Supervision/Verbal cueing   Wheelchair 150 feet activity     Assist      Assist Level: Supervision/Verbal cueing   Blood pressure 114/62, pulse 99, temperature 99 F (37.2 C), resp. rate 14, height 5\' 11"  (1.803 m), weight 78.3 kg, SpO2 91 %.    Medical Problem List and Plan: 1.  Decreased functional ability secondary to right transmetatarsal amputation 11/19/2019.  Wound VAC protocol to be completed 11/26/2019- TDWB on RLE             -patient may shower             -ELOS/Goals:  Mod I 5-7 days  -continue therapies 2.  Antithrombotics: -DVT/anticoagulation: SCD left lower extremity             -antiplatelet therapy: Aspirin 81 mg daily, Plavix 75 mg daily  -resuming trental? 3. Pain Management: Neurontin 100 mg twice daily, oxycodone as needed q4 hours prn 5-10 mg- might need more, was wincing in pain often- like hit by lightning 4. Mood: Provide emotional support             -antipsychotic agents: N/A 5. Neuropsych: This patient is capable of making decisions on his own behalf. 6. Skin/Wound Care: remove vac today  -apply dry dressing daily 7. Fluids/Electrolytes/Nutrition: Routine in and outs with follow-up chemistries 8.  Acute on chronic anemia.  f/u CBC with dialysis labs.  9.  Orthostasis with HD.  Continue ProAmatine 5 mg Monday Wednesday Friday 10.  End-stage renal disease.  Hemodialysis per renal services-usually M/W/F- after therapies  - basically anuric- forms urine every 2-3 days. F  11.  Chronic constipation with acute constipation- now with numerous soft bm's  -hold all bowel meds for now  -nausea better after bm's 12.  CAD with CABG 2019.  Continue aspirin.  No chest pain or increasing shortness of breath 13. DM- not on meds since 2019- HD keeps it controlled it appears.   -borderline control prior to admit on acute  -f/u as outpt LOS: 2 days A  FACE TO [place] 11/27/2019, 9:08 AM

## 2019-11-27 NOTE — Progress Notes (Signed)
  [facility] Progress Note   Subjective:  Seen in room - working with OT. Some foot pain, otherwise without complaints. Did ok with dialysis yesterday aside from circuit clotting at very end of his treatment - net UF 2.4L.  Objective Vitals:   11/26/19 1644 11/26/19 1757 11/26/19 1933 11/27/19 0503  BP: 119/68 111/74 111/64 114/62  Pulse: 90 79 72 99  Resp: 16 18 17 14   Temp: 98.3 F (36.8 C) 98 F (36.7 C) 97.6 F (36.4 C) 99 F (37.2 C)  TempSrc: Oral     SpO2: 96% 95% 100% 91%  Weight: 78.3 kg     Height:       Physical Exam General: Well appearing man, NAD Heart: RRR; no murmur Lungs: CTAB, no rales Abdomen: soft, non-tender Extremities: No LLE edema, R foot wrapped + wound vac in place Dialysis Access: LUE AVF + bruit  Additional Objective Labs: Basic Metabolic Panel: Recent Labs  Lab 11/21/19 0943 11/24/19 0821 11/26/19 1356  NA 137 136 132*  K 4.4 5.3* 5.5*  CL 97* 92* 86*  CO2 28 28 30   GLUCOSE 133* 94 112*  BUN 32* 58* 66*  CREATININE 8.08* 10.75* 10.66*  CALCIUM 8.5* 8.8* 9.1  PHOS 4.8* 5.5* 5.1*   Liver Function Tests: Recent Labs  Lab 11/21/19 0943 11/24/19 0821 11/26/19 1356  ALBUMIN 2.9* 2.9* 3.1*   CBC: Recent Labs  Lab 11/21/19 0943 11/24/19 0822 11/26/19 1357  WBC 9.3 9.1 9.1  HGB 12.5* 12.4* 12.0*  HCT 40.3 38.8* 36.7*  MCV 94.4 94.2 93.6  PLT 164 168 189   Medications:  . aspirin EC  81 mg Oral Daily  . atorvastatin  40 mg Oral q1800  . calcitRIOL  1.25 mcg Oral Q M,W,F-HD  . calcium acetate  1,334 mg Oral BID WC  . clopidogrel  75 mg Oral Daily  . docusate sodium  100 mg Oral BID  . feeding supplement (NEPRO CARB STEADY)  237 mL Oral BID BM  . gabapentin  100 mg Oral BID  . lipase/protease/amylase  36,000 Units Oral Daily  . midodrine  5 mg Oral Q M,W,F-HD  . multivitamin  1 tablet Oral QHS  . pantoprazole  40 mg Oral Daily  . polyethylene glycol  17 g Oral Daily  . senna  1 tablet Oral BID     Dialysis Orders: MWF @ [facility] 4hr, 450/800, EDW 71.5kg, 2K/2Ca, AVF, UFP #2, heparin 5000 - Parsabiv 2.25mcg IV q HD - Calcitriol 1.64mcg PO q HD  Assessment/Plan: 1. 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 Per [facility]. Wound vac in place.In CIR. 2. ESRD:Continue usual MWF schedule- next HD 8/20. Adding back in heparin d/t circuit clotting with last HD (had been on hold after surgery). 3. Hypotension/volume:BPlow/stable on admission, uses midodrine pre-HD.About 7kg up per weights, seems inaccurate - will attempt to get standing weight tomorrow. 4. Anemia:Hgb>12, no ESA indicated. 5. Metabolic bone disease:Ca/Phosat goal- continue home binders and calcitriol. Parasbiv not on formulary, follow calcium, labs pending 6. Nutrition: Alb low -continueNepro protein supps. 7. T2DM 8. CAD  Veneta Penton, Hershal Coria 11/27/2019, 9:11 AM  [employer]

## 2019-11-27 NOTE — Progress Notes (Signed)
Occupational Therapy Session Note  Patient Details  Name: Jon Anderson MRN: [id number] Date of Birth: Nov 28, 1950  Today's Date: 11/27/2019 OT Individual Time: 2952-8413 OT Individual Time Calculation (min): 73 min    Short Term Goals: Week 1:  OT Short Term Goal 1 (Week 1): STG= LTG d/t ELOS  Skilled Therapeutic Interventions/Progress Updates:  Patient met seated in wc in agreement with OT treatment session with focus on self-care re-education, functional transfers, household mobility, and energy conservation techniques as detailed below. Patient ambulated to sink with use of RW and CGA. Seated at sink level, patient completed 2/3 grooming tasks with set-up assist. Wc mobility to ADL apartment without need for rest break. OT provided education on safety with functional transfers in home environment with patient able to transition from EOB <> supine with supervision A. Block practice with shower transfer using TTB and CGA. In ADL kitchen, OT provided education on safety with small meal prep from wc level. Patient able to return demonstrate with occasional verbal cues. Session concluded with patient seated in wc with call bell within reach, belt alarm activated, and all needs met.   Therapy Documentation Precautions:  Precautions Precautions: Fall Precaution Comments: wound vac, R foot Required Braces or Orthoses: Other Brace Other Brace: post op shoe RLE Restrictions Weight Bearing Restrictions: Yes RLE Weight Bearing: Touchdown weight bearing Other Position/Activity Restrictions: wound vac General:    Therapy/Group: Individual Therapy  Jon Anderson 11/27/2019, 7:19 AM

## 2019-11-28 ENCOUNTER — Inpatient Hospital Stay (HOSPITAL_COMMUNITY): Payer: Medicare Other | Admitting: Physical Therapy

## 2019-11-28 ENCOUNTER — Inpatient Hospital Stay (HOSPITAL_COMMUNITY): Payer: Medicare Other | Admitting: Occupational Therapy

## 2019-11-28 LAB — RENAL FUNCTION PANEL
Albumin: 3.1 g/dL — ABNORMAL LOW (ref 3.5–5.0)
Anion gap: 16 — ABNORMAL HIGH (ref 5–15)
BUN: 55 mg/dL — ABNORMAL HIGH (ref 8–23)
CO2: 28 mmol/L (ref 22–32)
Calcium: 9.4 mg/dL (ref 8.9–10.3)
Chloride: 90 mmol/L — ABNORMAL LOW (ref 98–111)
Creatinine, Ser: 10.73 mg/dL — ABNORMAL HIGH (ref 0.61–1.24)
GFR calc Af Amer: 5 mL/min — ABNORMAL LOW (ref >60)
GFR calc non Af Amer: 4 mL/min — ABNORMAL LOW (ref >60)
Glucose, Bld: 101 mg/dL — ABNORMAL HIGH (ref 70–99)
Phosphorus: 4.5 mg/dL (ref 2.5–4.6)
Potassium: 4.9 mmol/L (ref 3.5–5.1)
Sodium: 134 mmol/L — ABNORMAL LOW (ref 135–145)

## 2019-11-28 LAB — CBC
HCT: 35.3 % — ABNORMAL LOW (ref 39.0–52.0)
Hemoglobin: 11.2 g/dL — ABNORMAL LOW (ref 13.0–17.0)
MCH: 29.9 pg (ref 26.0–34.0)
MCHC: 31.7 g/dL (ref 30.0–36.0)
MCV: 94.4 fL (ref 80.0–100.0)
Platelets: 254 10*3/uL (ref 150–400)
RBC: 3.74 MIL/uL — ABNORMAL LOW (ref 4.22–5.81)
RDW: 14.5 % (ref 11.5–15.5)
WBC: 9.2 10*3/uL (ref 4.0–10.5)
nRBC: 0 % (ref 0.0–0.2)

## 2019-11-28 LAB — GLUCOSE, CAPILLARY: Glucose-Capillary: 76 mg/dL (ref 70–99)

## 2019-11-28 MED ORDER — HEPARIN SODIUM (PORCINE) 1000 UNIT/ML IJ SOLN
INTRAMUSCULAR | Status: AC
  Administered 2019-11-28: 5000 [IU] via INTRAVENOUS_CENTRAL
  Filled 2019-11-28: qty 5

## 2019-11-28 MED ORDER — HEPARIN SODIUM (PORCINE) 1000 UNIT/ML DIALYSIS
5000.0000 [IU] | Freq: Once | INTRAMUSCULAR | Status: AC
  Filled 2019-11-28: qty 5

## 2019-11-28 NOTE — Progress Notes (Signed)
Occupational Therapy Session Note  Patient Details  Name: KEYMARION BEARMAN MRN: [id number] Date of Birth: 1950/09/14  Today's Date: 11/28/2019 OT Individual Time: 4469-5072 OT Individual Time Calculation (min): 45 min  and Today's Date: 11/28/2019 OT Missed Time: 30 Minutes Missed Time Reason: Other (comment) (OT late)   Short Term Goals: Week 1:  OT Short Term Goal 1 (Week 1): STG= LTG d/t ELOS  Skilled Therapeutic Interventions/Progress Updates:    Pt greeted semi-reclined in bed and agreeable to OT treatment session. Pt reported need to go to the bathroom. Pt completed bed mobility mod I. Pt then needed min A to don off loading shoe on R LE, but was able to don his regular shoe on L. Pt ambulated to the bathroom using RW and CGA. CGA for balance when alternating UEs to remove pants. Pt with only smear of BM on toilet paper and voided small amount of urine. Pt able to complete peri-care with CGA for balance in standing to wash buttocks. Pt washed hands and face from wc at the sink. Pt left seated in wc at end of session with alarm belt on, call bell in reach, and needs met.   Therapy Documentation Precautions:  Precautions Precautions: Fall Precaution Comments: wound vac, R foot Required Braces or Orthoses: Other Brace Other Brace: post op shoe RLE Restrictions Weight Bearing Restrictions: Yes RLE Weight Bearing: Touchdown weight bearing Other Position/Activity Restrictions: wound vac Pain: Pt reports pain is manageable at this time. Repositioned for comfort  Therapy/Group: Individual Therapy  Valma Cava 11/28/2019, 8:07 AM

## 2019-11-28 NOTE — Progress Notes (Signed)
Wound vac removed from right foot surgical site. Sutures intact. Wound edges approximated. Removal tolerated well. Dry dressing and Ace wrap applied per order.

## 2019-11-28 NOTE — Progress Notes (Signed)
  [facility] Progress Note   Subjective:  Seen in room - no acute events. Feels well. Hd later today.  Objective Vitals:   11/27/19 0503 11/27/19 1458 11/27/19 1952 11/28/19 0418  BP: 114/62 107/68 125/81 132/75  Pulse: 99 89 78 91  Resp: 14 18 15 16   Temp: 99 F (37.2 C) 98.3 F (36.8 C) 97.6 F (36.4 C) 98.2 F (36.8 C)  TempSrc:      SpO2: 91% 97%  99%  Weight:      Height:       Physical Exam General: Well appearing man, NAD Heart: RRR; no murmur Lungs: CTAB, no rales Abdomen: soft, non-tender Extremities: No LLE edema, R foot wrapped + wound vac in place Dialysis Access: LUE AVF + bruit  Additional Objective Labs: Basic Metabolic Panel: Recent Labs  Lab 11/24/19 0821 11/26/19 1356  NA 136 132*  K 5.3* 5.5*  CL 92* 86*  CO2 28 30  GLUCOSE 94 112*  BUN 58* 66*  CREATININE 10.75* 10.66*  CALCIUM 8.8* 9.1  PHOS 5.5* 5.1*   Liver Function Tests: Recent Labs  Lab 11/24/19 0821 11/26/19 1356  ALBUMIN 2.9* 3.1*   CBC: Recent Labs  Lab 11/24/19 0822 11/26/19 1357  WBC 9.1 9.1  HGB 12.4* 12.0*  HCT 38.8* 36.7*  MCV 94.2 93.6  PLT 168 189   Medications:  . aspirin EC  81 mg Oral Daily  . atorvastatin  40 mg Oral q1800  . calcitRIOL  1.25 mcg Oral Q M,W,F-HD  . calcium acetate  1,334 mg Oral BID WC  . Chlorhexidine Gluconate Cloth  6 each Topical Q0600  . clopidogrel  75 mg Oral Daily  . feeding supplement (NEPRO CARB STEADY)  237 mL Oral BID BM  . gabapentin  100 mg Oral BID  . lipase/protease/amylase  36,000 Units Oral Daily  . midodrine  5 mg Oral Q M,W,F-HD  . multivitamin  1 tablet Oral QHS  . pantoprazole  40 mg Oral Daily    Dialysis Orders: MWF @ [facility] 4hr, 450/800, EDW 71.5kg, 2K/2Ca, AVF, UFP #2, heparin 5000 - Parsabiv 2.51mcg IV q HD - Calcitriol 1.52mcg PO q HD  Assessment/Plan: 1. 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 Per VVS. Wound vac in place.In CIR. 2. ESRD:Continue usual MWF schedule- next HD  later today. Adding back in heparin d/t circuit clotting with last HD (had been on hold after surgery). 3. Hypotension/volume:BPlow/stable on admission, uses midodrine pre-HD.About 7kg up per weights, seems inaccurate - will attempt to get standing weight today 4. Anemia:Hgb>12, no ESA indicated. 5. Metabolic bone disease:Ca/Phosat goal- continue home binders and calcitriol. Parasbiv not on formulary, follow calcium, labs pending 6. Nutrition: Alb low -continueNepro protein supps. 7. T2DM 8. CAD  Gean Quint, MD [facility] 11/28/2019, 10:28 AM

## 2019-11-28 NOTE — Progress Notes (Signed)
[facility] PHYSICAL MEDICINE & REHABILITATION PROGRESS NOTE   Subjective/Complaints: Notes that stools are still loose. No bm reported since yesterday morning.   ROS: Patient denies fever, rash, sore throat, blurred vision, nausea, vomiting, diarrhea, cough, shortness of breath or chest pain,  headache, or mood change.    Objective:   No results found. Recent Labs    11/26/19 1357  WBC 9.1  HGB 12.0*  HCT 36.7*  PLT 189   Recent Labs    11/26/19 1356  NA 132*  K 5.5*  CL 86*  CO2 30  GLUCOSE 112*  BUN 66*  CREATININE 10.66*  CALCIUM 9.1    Intake/Output Summary (Last 24 hours) at 11/28/2019 0946 Last data filed at 11/28/2019 0820 Gross per 24 hour  Intake 840 ml  Output --  Net 840 ml     Physical Exam: Vital Signs Blood pressure 132/75, pulse 91, temperature 98.2 F (36.8 C), resp. rate 16, height 5\' 11"  (1.803 m), weight 78.3 kg, SpO2 99 %. Constitutional: No distress . Vital signs reviewed. HEENT: EOMI, oral membranes moist Neck: supple Cardiovascular: RRR without murmur. No JVD    Respiratory/Chest: CTA Bilaterally without wheezes or rales. Normal effort    GI/Abdomen: BS +, non-tender, non-distended Ext: no clubbing, cyanosis, or edema, AVG with thrill Psych: pleasant and cooperative  Musculoskeletal:     Cervical back: Normal range of motion. No rigidity.     Comments: UE 5/5. LLE 4/5. RLE limited by vac Skin:    Comments: right TM wound clean with s/s drainage, mild Neurological:     Comments: Alert and oriented x 3. Normal insight and awareness. Intact Memory. Normal language and speech. Cranial nerve exam unremarkable  Decreased sensation to light touch in RLE    Assessment/Plan: 1. Functional deficits secondary to s/p transmetatarsal amputation of right foot, which require 3+ hours per day of interdisciplinary therapy in a comprehensive inpatient rehab setting.  Physiatrist is providing close team supervision and 24 hour management of active  medical problems listed below.  Physiatrist and rehab team continue to assess barriers to discharge/monitor patient progress toward functional and medical goals  Care Tool:  Bathing    Body parts bathed by patient: Right arm, Left arm, Chest, Abdomen, Front perineal area, Buttocks, Right upper leg, Left upper leg, Right lower leg, Left lower leg, Face         Bathing assist Assist Level: Minimal Assistance - Patient > 75%     Upper Body Dressing/Undressing Upper body dressing   What is the patient wearing?: Pull over shirt, Button up shirt    Upper body assist Assist Level: Set up assist    Lower Body Dressing/Undressing Lower body dressing      What is the patient wearing?: Underwear/pull up, Pants     Lower body assist Assist for lower body dressing: Minimal Assistance - Patient > 75%     Toileting Toileting    Toileting assist Assist for toileting: Minimal Assistance - Patient > 75%     Transfers Chair/bed transfer  Transfers assist     Chair/bed transfer assist level: Minimal Assistance - Patient > 75%     Locomotion Ambulation   Ambulation assist      Assist level: Contact Guard/Touching assist Assistive device: Walker-rolling Max distance: 17ft   Walk 10 feet activity   Assist     Assist level: Contact Guard/Touching assist Assistive device: Walker-rolling   Walk 50 feet activity   Assist Walk 50 feet with 2 turns  activity did not occur: Safety/medical concerns  Assist level: Contact Guard/Touching assist Assistive device: Walker-rolling    Walk 150 feet activity   Assist Walk 150 feet activity did not occur: Safety/medical concerns         Walk 10 feet on uneven surface  activity   Assist     Assist level: Minimal Assistance - Patient > 75%     Wheelchair     Assist Will patient use wheelchair at discharge?: Yes Type of Wheelchair: Manual    Wheelchair assist level: Supervision/Verbal cueing Max  wheelchair distance: 128ft    Wheelchair 50 feet with 2 turns activity    Assist        Assist Level: Supervision/Verbal cueing   Wheelchair 150 feet activity     Assist      Assist Level: Supervision/Verbal cueing   Blood pressure 132/75, pulse 91, temperature 98.2 F (36.8 C), resp. rate 16, height 5\' 11"  (1.803 m), weight 78.3 kg, SpO2 99 %.    Medical Problem List and Plan: 1.  Decreased functional ability secondary to right transmetatarsal amputation 11/19/2019.  Wound VAC protocol to be completed 11/26/2019- TDWB on RLE             -patient may shower             -ELOS/Goals:  Mod I 8/24  -continue therapies 2.  Antithrombotics: -DVT/anticoagulation: SCD left lower extremity             -antiplatelet therapy: Aspirin 81 mg daily, Plavix 75 mg daily  -resuming trental? 3. Pain Management: Neurontin 100 mg twice daily, oxycodone as needed q4 hours prn 5-10 mg- might need more, was wincing in pain often- like hit by lightning 4. Mood: Provide emotional support             -antipsychotic agents: N/A 5. Neuropsych: This patient is capable of making decisions on his own behalf. 6. Skin/Wound Care: wound cdi  -oil emersion dressing, gauze, ACE daily 7. Fluids/Electrolytes/Nutrition: Routine in and outs with follow-up chemistries 8.  Acute on chronic anemia.  f/u CBC with dialysis labs.  9.  Orthostasis with HD.  Continue ProAmatine 5 mg Monday Wednesday Friday 10.  End-stage renal disease.  Hemodialysis per renal services-usually M/W/F- after therapies  - basically anuric- forms urine every 2-3 days. F  11.  Chronic constipation with acute constipation- now with numerous soft bm's  -holding all bowel meds for now  -nausea better after bm's 12.  CAD with CABG 2019.  Continue aspirin.  No chest pain or increasing shortness of breath 13. DM- not on meds since 2019- HD keeps it controlled it appears.   -borderline control prior to admit on acute  -f/u as outpt. Not  checking cbg's LOS: 3 days A FACE TO FACE EVALUATION WAS PERFORMED  Meredith Staggers 11/28/2019, 9:46 AM

## 2019-11-28 NOTE — Progress Notes (Signed)
Physical Therapy Session Note  Patient Details  Name: Jon Anderson MRN: [id number] Date of Birth: 10-06-1950  Today's Date: 11/28/2019 PT Individual Time: 1052-1202 PT Individual Time Calculation (min): 70 min   Short Term Goals: Week 1:  PT Short Term Goal 1 (Week 1): STG = LTG due to short ELOS.  Skilled Therapeutic Interventions/Progress Updates:  Pt received in w/c & agreeable to tx. Pt propels w/c around unit with BUE & independence, managing w/c parts with only occasional cuing. Pt completes sit<>stand with supervision from w/c & completes car transfer at sedan simulated height with RW & supervision but cuing to push more with 1UE as pt initially pulling up on RW with other UE. Discussed home setup again with pt initially describing front door but pt & PT discussed need to use back door entrance as it's only a small single step & pt agreeable. Small step negotiation (3") with RW & min assist with pt attempting TDWB but question pt's ability to limit weight bearing through RLE. Gait x 45 ft with RW & supervision with pt initially performing NWB RLE but progresses to TDWB (again question pt's ability to limit weight bearing through RLE) with pt having to sit 2/2 fatigue. Provided pt with HEP & PT provides education & demo of exercises (RLE: long arc quads, marches with RW, hamstring curls with RW, & hip extension with RW) but unable to practice due to time constraints. At end of session pt left in w/c with chair alarm donned & call bell, all needs in reach.  Therapy Documentation Precautions:  Precautions Precautions: Fall Required Braces or Orthoses: Other Brace Other Brace: post op shoe RLE Restrictions  Weight Bearing Restrictions: Yes RLE Weight Bearing: Touchdown weight bearing    Pain: 6/10 in RLE - rest breaks provided, pt requests pain medication during middle of session & nurse administers, also provided ELR to elevate RLE for increased comfort   Therapy/Group:  Individual Therapy  Waunita Schooner 11/28/2019, 12:21 PM

## 2019-11-28 NOTE — Progress Notes (Signed)
Physical Therapy Session Note  Patient Details  Name: Jon Anderson MRN: [id number] Date of Birth: Dec 03, 1950  Today's Date: 11/28/2019 PT Individual Time: 0920-1000 PT Individual Time Calculation (min): 40 min   Short Term Goals: Week 1:  PT Short Term Goal 1 (Week 1): STG = LTG due to short ELOS.  Skilled Therapeutic Interventions/Progress Updates: Pt presents sitting in w/c, agreeable to therapy.  Pt wheeled w/c out of room and into hallway.  Pt able to negotiate w/c in hallways up to 150' per trial before rest required.  Pt negotiated over sloped surface w/ verbal cues for increased shoulder flexion and then elbow extension to increase run.  Pt performed short distance gait in gym w/ RW and CGA.  Pt educated on use of RW and UE to improve gait cycle.  Pt hesitant to utilize TDWB RLE, but is able to perform w/ cueing.  Pt amb up to 4' for longer distance gait w/ RW and CGA, including turns to return to seat.  Pt returned to room and chair alarm placed w/ all needs in reach.     Therapy Documentation Precautions:  Precautions Precautions: Fall Precaution Comments: wound vac, R foot Required Braces or Orthoses: Other Brace Other Brace: post op shoe RLE Restrictions Weight Bearing Restrictions: Yes RLE Weight Bearing: Touchdown weight bearing Other Position/Activity Restrictions: wound vac General:   Vital Signs:   Pain:0/10 Pain Assessment Pain Scale: 0-10 Pain Score: 7  Mobility:   Locomotion :    Trunk/Postural Assessment :    Balance:   Exercises:   Other Treatments:      Therapy/Group: Individual Therapy  Ladoris Gene 11/28/2019, 12:33 PM

## 2019-11-28 NOTE — IPOC Note (Signed)
Overall Plan of Care [facility]) Patient Details Name: Jon Anderson MRN: [id number] DOB: 10-21-1950  Admitting Diagnosis: S/P transmetatarsal amputation of foot, right [facility])  Hospital Problems: Principal Problem:   S/P transmetatarsal amputation of foot, right ([place]) Active Problems:   CAD (coronary artery disease) nonobstructive per cath 2012   ESRD (end stage renal disease) on dialysis ([place])   Right below-knee amputee ([place])     Functional Problem List: Nursing Pain, Safety, Endurance, Skin Integrity  PT Balance, Pain, Safety, Endurance, Motor, Sensory, Skin Integrity, Edema  OT Balance, Endurance, Motor, Pain, Skin Integrity  SLP    TR         Basic ADL's: OT Bathing, Dressing, Toileting     Advanced  ADL's: OT       Transfers: PT Bed Mobility, Bed to Chair, Car, Manufacturing systems engineer, Metallurgist: PT Ambulation, Emergency planning/management officer, Stairs     Additional Impairments: OT None  SLP        TR      Anticipated Outcomes Item Anticipated Outcome  Self Feeding no goal set  Swallowing      Basic self-care  mod I  Toileting  mod I   Bathroom Transfers (S)  Bowel/Bladder  Pt will be continent x 2  Transfers  supervision with LRAD  Locomotion  supervision household ambulation, supervision w/c mobility  Communication     Cognition     Pain  less than 3 out of 10 on pain scale  Safety/Judgment  pt will remain free from falls with min assist   Therapy Plan: PT Intensity: Minimum of 1-2 x/day ,45 to 90 minutes PT Frequency: 5 out of 7 days PT Duration Estimated Length of Stay: 5-7 days OT Intensity: Minimum of 1-2 x/day, 45 to 90 minutes OT Frequency: 5 out of 7 days OT Duration/Estimated Length of Stay: 7-10 days     Due to the current state of emergency, patients may not be receiving their 3-hours of Medicare-mandated therapy.   Team Interventions: Nursing Interventions Patient/Family Education, Disease Management/Prevention, Skin  Care/Wound Management, Discharge Planning, Pain Management  PT interventions Ambulation/gait training, Discharge planning, DME/adaptive equipment instruction, Functional mobility training, Pain management, Psychosocial support, Splinting/orthotics, Therapeutic Activities, UE/LE Strength taining/ROM, Visual/perceptual remediation/compensation, Wheelchair propulsion/positioning, UE/LE Coordination activities, Therapeutic Exercise, Training and development officer, Community reintegration, Disease management/prevention, Technical sales engineer stimulation, Neuromuscular re-education, Patient/family education, Skin care/wound management, Stair training  OT Interventions Training and development officer, Engineer, drilling, Patient/family education, Therapeutic Activities, Wheelchair propulsion/positioning, Therapeutic Exercise, Psychosocial support, Functional mobility training, Self Care/advanced ADL retraining, Discharge planning, Skin care/wound managment, Pain management, Disease mangement/prevention  SLP Interventions    TR Interventions    SW/CM Interventions Discharge Planning, Patient/Family Education, Psychosocial Support   Barriers to Discharge MD  Wound care  Nursing Decreased caregiver support, Wound Care, Lack of/limited family support, Inaccessible home environment, Home environment access/layout, Weight bearing restrictions    PT      OT Inaccessible home environment 3 stairs to enter, no railing  SLP      SW Decreased caregiver support, Lack of/limited family support     Team Discharge Planning: Destination: PT-Home ,OT- Home , SLP-  Projected Follow-up: PT-Home health PT, 24 hour supervision/assistance, OT-  Home health OT, SLP-  Projected Equipment Needs: PT- , OT- Tub/shower bench, SLP-  Equipment Details: PT-18x18 w/c, OT-  Patient/family involved in discharge planning: PT- Patient,  OT-Patient, SLP-   MD ELOS: 5-7 days Medical Rehab Prognosis:  Excellent Assessment:  The patient has been  admitted for CIR therapies with the diagnosis of right TMA and associated debility/mobility deficits. The team will be addressing functional mobility, strength, stamina, balance, safety, adaptive techniques and equipment, self-care, bowel and bladder mgt, patient and caregiver education, wound care, pain mgt, community reentry. Goals have been set at supervision to mod I with basic mobilty and self-care.   Due to the current state of emergency, patients may not be receiving their 3 hours per day of Medicare-mandated therapy.    Meredith Staggers, MD, FAAPMR      See Team Conference Notes for weekly updates to the plan of care

## 2019-11-29 ENCOUNTER — Inpatient Hospital Stay (HOSPITAL_COMMUNITY): Payer: Medicare Other | Admitting: Physical Therapy

## 2019-11-29 ENCOUNTER — Inpatient Hospital Stay (HOSPITAL_COMMUNITY): Payer: Medicare Other | Admitting: Occupational Therapy

## 2019-11-29 MED ORDER — SENNOSIDES-DOCUSATE SODIUM 8.6-50 MG PO TABS
1.0000 | ORAL_TABLET | Freq: Two times a day (BID) | ORAL | Status: DC
  Administered 2019-11-29 – 2019-12-02 (×5): 1 via ORAL
  Filled 2019-11-29 (×6): qty 1

## 2019-11-29 NOTE — Progress Notes (Signed)
  [facility] Progress Note   Subjective:  Seen in room - no acute events. Reports constipation. Tolerated HD yesterday s/p UF 2620cc. Denies any orthostatic symptoms.  Objective Vitals:   11/28/19 1849 11/28/19 2024 11/29/19 0201 11/29/19 0453  BP: (!) 82/62 (!) 94/57 (!) 108/57 123/67  Pulse: 98 94 85 81  Resp: 18 16  16   Temp: 98.2 F (36.8 C) 98.2 F (36.8 C)  98.3 F (36.8 C)  TempSrc:  Oral  Oral  SpO2: 97% 97%  94%  Weight:      Height:       Physical Exam General: Well appearing man, NAD, in bed Heart: reg rate Lungs: normal wob, bl chest expansion Abdomen: soft, non-tender Extremities: No LLE edema, R foot wrapped + wound vac in place Dialysis Access: LUE AVF + bruit  Additional Objective Labs: Basic Metabolic Panel: Recent Labs  Lab 11/24/19 0821 11/26/19 1356 11/28/19 1422  NA 136 132* 134*  K 5.3* 5.5* 4.9  CL 92* 86* 90*  CO2 28 30 28   GLUCOSE 94 112* 101*  BUN 58* 66* 55*  CREATININE 10.75* 10.66* 10.73*  CALCIUM 8.8* 9.1 9.4  PHOS 5.5* 5.1* 4.5   Liver Function Tests: Recent Labs  Lab 11/24/19 0821 11/26/19 1356 11/28/19 1422  ALBUMIN 2.9* 3.1* 3.1*   CBC: Recent Labs  Lab 11/24/19 0822 11/26/19 1357 11/28/19 1422  WBC 9.1 9.1 9.2  HGB 12.4* 12.0* 11.2*  HCT 38.8* 36.7* 35.3*  MCV 94.2 93.6 94.4  PLT 168 189 254   Medications:  . aspirin EC  81 mg Oral Daily  . atorvastatin  40 mg Oral q1800  . calcitRIOL  1.25 mcg Oral Q M,W,F-HD  . calcium acetate  1,334 mg Oral BID WC  . Chlorhexidine Gluconate Cloth  6 each Topical Q0600  . clopidogrel  75 mg Oral Daily  . feeding supplement (NEPRO CARB STEADY)  237 mL Oral BID BM  . gabapentin  100 mg Oral BID  . lipase/protease/amylase  36,000 Units Oral Daily  . midodrine  5 mg Oral Q M,W,F-HD  . multivitamin  1 tablet Oral QHS  . pantoprazole  40 mg Oral Daily  . senna-docusate  1 tablet Oral BID    Dialysis Orders: MWF @ [facility] 4hr, 450/800, EDW 71.5kg, 2K/2Ca,  AVF, UFP #2, heparin 5000 - Parsabiv 2.26mcg IV q HD - Calcitriol 1.68mcg PO q HD  Assessment/Plan: 1. 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 Per VVS. Wound vac in place.In CIR. 2. ESRD:Continue usual MWF schedule- next HD Monday. Adding back in heparin d/t circuit clotting with last HD (had been on hold after surgery). 3. Hypotension/volume:BPlow/stable on admission, uses midodrine pre-HD.will use 76kg as his edw. 4. Anemia:Hgb>12, no ESA indicated. 5. Metabolic bone disease:Ca/Phosat goal- continue home binders and calcitriol. Parasbiv not on formulary, follow calcium, labs pending 6. Nutrition: Alb low -continueNepro protein supps. 7. T2DM 8. CAD  Gean Quint, MD [facility] 11/29/2019, 9:57 AM

## 2019-11-29 NOTE — Progress Notes (Signed)
[facility] PHYSICAL MEDICINE & REHABILITATION PROGRESS NOTE   Subjective/Complaints: C/o constipation. Otherwise doing fairly well. Pain controlled. Making gains in therapy  ROS: Patient denies fever, rash, sore throat, blurred vision, nausea, vomiting, diarrhea, cough, shortness of breath or chest pain, joint or back pain, headache, or mood change.      Objective:   No results found. Recent Labs    11/26/19 1357 11/28/19 1422  WBC 9.1 9.2  HGB 12.0* 11.2*  HCT 36.7* 35.3*  PLT 189 254   Recent Labs    11/26/19 1356 11/28/19 1422  NA 132* 134*  K 5.5* 4.9  CL 86* 90*  CO2 30 28  GLUCOSE 112* 101*  BUN 66* 55*  CREATININE 10.66* 10.73*  CALCIUM 9.1 9.4    Intake/Output Summary (Last 24 hours) at 11/29/2019 0916 Last data filed at 11/29/2019 0900 Gross per 24 hour  Intake 440 ml  Output 2620 ml  Net -2180 ml     Physical Exam: Vital Signs Blood pressure 123/67, pulse 81, temperature 98.3 F (36.8 C), temperature source Oral, resp. rate 16, height 5\' 11"  (1.803 m), weight 76 kg, SpO2 94 %. Constitutional: No distress . Vital signs reviewed. HEENT: EOMI, oral membranes moist Neck: supple Cardiovascular: RRR without murmur. No JVD    Respiratory/Chest: CTA Bilaterally without wheezes or rales. Normal effort    GI/Abdomen: BS +, non-tender, non-distended Ext: no clubbing, cyanosis, or edema. AVG with thrill Psych: pleasant and cooperative Musculoskeletal:     Cervical back: Normal range of motion. No rigidity.       Skin:    Comments: right TM wound clean with slight s/s drainage Neurological:     Comments: Alert and oriented x 3. Normal insight and awareness. Intact Memory. Normal language and speech. Cranial nerve exam unremarkable  Decreased sensation to light touch in RLE    Assessment/Plan: 1. Functional deficits secondary to s/p transmetatarsal amputation of right foot, which require 3+ hours per day of interdisciplinary therapy in a comprehensive  inpatient rehab setting.  Physiatrist is providing close team supervision and 24 hour management of active medical problems listed below.  Physiatrist and rehab team continue to assess barriers to discharge/monitor patient progress toward functional and medical goals  Care Tool:  Bathing    Body parts bathed by patient: Right arm, Left arm, Chest, Abdomen, Front perineal area, Buttocks, Right upper leg, Left upper leg, Right lower leg, Left lower leg, Face         Bathing assist Assist Level: Minimal Assistance - Patient > 75%     Upper Body Dressing/Undressing Upper body dressing   What is the patient wearing?: Pull over shirt, Button up shirt    Upper body assist Assist Level: Set up assist    Lower Body Dressing/Undressing Lower body dressing      What is the patient wearing?: Underwear/pull up, Pants     Lower body assist Assist for lower body dressing: Minimal Assistance - Patient > 75%     Toileting Toileting    Toileting assist Assist for toileting: Minimal Assistance - Patient > 75%     Transfers Chair/bed transfer  Transfers assist     Chair/bed transfer assist level: Minimal Assistance - Patient > 75%     Locomotion Ambulation   Ambulation assist      Assist level: Supervision/Verbal cueing Assistive device: Walker-rolling Max distance: 50'   Walk 10 feet activity   Assist     Assist level: Supervision/Verbal cueing Assistive device: Walker-rolling  Walk 50 feet activity   Assist Walk 50 feet with 2 turns activity did not occur: Safety/medical concerns  Assist level: Contact Guard/Touching assist Assistive device: Walker-rolling    Walk 150 feet activity   Assist Walk 150 feet activity did not occur: Safety/medical concerns         Walk 10 feet on uneven surface  activity   Assist     Assist level: Minimal Assistance - Patient > 75%     Wheelchair     Assist Will patient use wheelchair at discharge?:  Yes Type of Wheelchair: Manual    Wheelchair assist level: Independent Max wheelchair distance: 150    Wheelchair 50 feet with 2 turns activity    Assist        Assist Level: Independent   Wheelchair 150 feet activity     Assist      Assist Level: Independent   Blood pressure 123/67, pulse 81, temperature 98.3 F (36.8 C), temperature source Oral, resp. rate 16, height 5\' 11"  (1.803 m), weight 76 kg, SpO2 94 %.    Medical Problem List and Plan: 1.  Decreased functional ability secondary to right transmetatarsal amputation 11/19/2019.  Wound VAC protocol to be completed 11/26/2019- TDWB on RLE             -patient may shower             -ELOS/Goals:  Mod I,  8/24  -continue therapies 2.  Antithrombotics: -DVT/anticoagulation: SCD left lower extremity             -antiplatelet therapy: Aspirin 81 mg daily, Plavix 75 mg daily  -resuming trental? 3. Pain Management: Neurontin 100 mg twice daily, oxycodone as needed q4 hours prn 5-10 mg- might need more, was wincing in pain often- like hit by lightning 4. Mood: Provide emotional support             -antipsychotic agents: N/A 5. Neuropsych: This patient is capable of making decisions on his own behalf. 6. Skin/Wound Care: wound cdi  -continue oil emersion dressing, gauze, ACE daily 7. Fluids/Electrolytes/Nutrition: Routine in and outs with follow-up chemistries 8.  Acute on chronic anemia.  f/u CBC with dialysis labs.   8/20 11.2 9.  Orthostasis with HD.  Continue ProAmatine 5 mg Monday Wednesday Friday 10.  End-stage renal disease.  Hemodialysis per renal services-usually M/W/F- after therapies  - basically anuric- forms urine every 2-3 days.   11.  Chronic constipation with acute constipation- had loose stool after intervention  -resume senna-s bid  -continues on creon also 12.  CAD with CABG 2019.  Continue aspirin.  No chest pain or increasing shortness of breath 13. DM- not on meds since 2019- HD keeps it  controlled it appears.   -borderline control prior to admit on acute  -f/u as outpt. Not checking cbg's LOS: 4 days A FACE TO FACE EVALUATION WAS PERFORMED  Meredith Staggers 11/29/2019, 9:16 AM

## 2019-11-29 NOTE — Progress Notes (Addendum)
Occupational Therapy Session Note  Patient Details  Name: Jon Anderson MRN: [id number] Date of Birth: September 16, 1950  Today's Date: 11/29/2019 OT Individual Time: 0850-1000 OT Individual Time Calculation (min): 70 min    Short Term Goals: Week 2:     Skilled Therapeutic Interventions/Progress Updates:   1st session: focus of session was that he worked on skills especially to increase independen ce with ADLs, functional mobility and transfers.   Overall focus toward goals was bed mobility o= extra time     EOB to toilet transfer- extra time and CGA due to "my right foot needs to become undizzy" and right LE status TDWB    Sit to stand= close supervision Upper body bathing andressing = setup dynhamic standing balance in prep for safer independence= close S to CGA Lower body dressing= minmal assist, expecially to don and doff over right foot   Patient appeared  To maintain right lower extremity TDWB  2nd session: 1245-1352  Individual Minutes=67 Focus this session dynamic standing balance at standard walker while maintaining R LE TDWB and alternating using hands at the [employer].   He required CGA to maintaing balance for 3-5 minutes at a time before requesting to rest.  Completed upper extremity, including shoulder girdle, pects, triceps work with 2 lbs to increase strength for helping to increase sit to stand ease for self care and functional mobiity.   Also used the 'pull out with both hands light weight on the string."  Continue OT Plan of Care     Therapy Documentation Precautions:  Precautions Precautions: Fall Precaution Comments: wound vac, R foot Required Braces or Orthoses: Other Brace Other Brace: post op shoe RLE Restrictions Weight Bearing Restrictions: Yes RLE Weight Bearing:  (heel WB) Other Position/Activity Restrictions: wound vac General:   Vital Signs:   Pain:denied   ADL:  Vision   Perception    Praxis   Exercises:   Other  Treatments:     Therapy/Group: Individual Therapy  Alfredia Ferguson [facility] 11/29/2019, 9:17 AM

## 2019-11-29 NOTE — Progress Notes (Signed)
Physical Therapy Session Note  Patient Details  Name: Jon Anderson MRN: [id number] Date of Birth: [birth year]  Today's Date: 11/29/2019 PT Individual Time: 1898-4210 and 1630-1710 PT Individual Time Calculation (min): 41 min and 40 min    Short Term Goals: Week 1:  PT Short Term Goal 1 (Week 1): STG = LTG due to short ELOS.  Skilled Therapeutic Interventions/Progress Updates:   session 1  Pt received sitting in WC and agreeable to PT. WC mobility through hall 2x 271f with supervision assist and min cues for safety in turns intermittently.   PT instructed p tin HEP that had been provided in previous session:  LAQ.x15 BLE  Standing with UE support on RW:  RLE hip flexion x15 RLE HS curl x15 RLE Hip extension x 15 Cue for improved posture, decreased compensations, proper speed, and full ROM .   Gait training with RW x 520fwith supervision assist and cues for AD management in turns. [facility] returned to [facility] and [facility] and all needs met.    Session 2.   Pt received sitting in recliner and agreeable to PT. PT instructed pt in modified supine level therex from recliner.  SLR, hip abduction, heel slide, ankle PF/DF, quad set, glute sets, each completed x 10-15 with cues for proper speed of eccentric movement, full ROM, safety awareness, and importance of performing exercises multiple times a day to maintain full ROM in all joints in BLE.  PT noted that darco shoe had a hole in the sole. Pt reports that shoe is from prior to amputation. PT educated pt possibility of getting off loading shoe to prevent WB through distal amputation.  Pt left sitting in recliner with call bell in reach and all needs met.          Therapy Documentation Precautions:  Precautions Precautions: Fall Precaution Comments: wound vac, R foot Required Braces or Orthoses: Other Brace Other Brace: post op shoe RLE Restrictions Weight Bearing Restrictions: Yes RLE Weight  Bearing:  (heel WB) Other Position/Activity Restrictions: wound vac    Vital Signs: Therapy Vitals Temp: 98.3 F (36.8 C) Pulse Rate: 91 Resp: 16 BP: (!) 143/73 Patient Position (if appropriate): Sitting Oxygen Therapy SpO2: 99 % O2 Device: Room Air Pain:   denies   Therapy/Group: Individual Therapy  AuLorie Phenix/21/2021, 3:09 PM

## 2019-11-30 NOTE — Progress Notes (Signed)
  [name] Progress Note   Subjective: No acute events, still has not had a bowel movement otherwise comfortable.  Objective Vitals:   11/29/19 0453 11/29/19 1458 11/29/19 2023 11/30/19 0540  BP: 123/67 (!) 143/73 135/90 126/63  Pulse: 81 91 78 81  Resp: 16 16 18 17   Temp: 98.3 F (36.8 C) 98.3 F (36.8 C) 98.2 F (36.8 C) 98.1 F (36.7 C)  TempSrc: Oral   Oral  SpO2: 94% 99% 97% 96%  Weight:      Height:       Physical Exam General: Well appearing man, NAD, laying flat in bed Heart: reg rate Lungs: normal wob, bl chest expansion Abdomen: soft, non-tender Extremities: No LLE edema, R foot wrapped + wound vac in place Dialysis Access: LUE AVF + bruit  Additional Objective Labs: Basic Metabolic Panel: Recent Labs  Lab 11/24/19 0821 11/26/19 1356 11/28/19 1422  NA 136 132* 134*  K 5.3* 5.5* 4.9  CL 92* 86* 90*  CO2 28 30 28   GLUCOSE 94 112* 101*  BUN 58* 66* 55*  CREATININE 10.75* 10.66* 10.73*  CALCIUM 8.8* 9.1 9.4  PHOS 5.5* 5.1* 4.5   Liver Function Tests: Recent Labs  Lab 11/24/19 0821 11/26/19 1356 11/28/19 1422  ALBUMIN 2.9* 3.1* 3.1*   CBC: Recent Labs  Lab 11/24/19 0822 11/26/19 1357 11/28/19 1422  WBC 9.1 9.1 9.2  HGB 12.4* 12.0* 11.2*  HCT 38.8* 36.7* 35.3*  MCV 94.2 93.6 94.4  PLT 168 189 254   Medications:  . aspirin EC  81 mg Oral Daily  . atorvastatin  40 mg Oral q1800  . calcitRIOL  1.25 mcg Oral Q M,W,F-HD  . calcium acetate  1,334 mg Oral BID WC  . Chlorhexidine Gluconate Cloth  6 each Topical Q0600  . clopidogrel  75 mg Oral Daily  . feeding supplement (NEPRO CARB STEADY)  237 mL Oral BID BM  . gabapentin  100 mg Oral BID  . lipase/protease/amylase  36,000 Units Oral Daily  . midodrine  5 mg Oral Q M,W,F-HD  . multivitamin  1 tablet Oral QHS  . pantoprazole  40 mg Oral Daily  . senna-docusate  1 tablet Oral BID    Dialysis Orders: MWF @ [facility] 4hr, 450/800, EDW 71.5kg, 2K/2Ca, AVF, UFP #2, heparin  5000 - Parsabiv 2.36mcg IV q HD - Calcitriol 1.24mcg PO q HD  Assessment/Plan: 1. 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 Per [facility]. Wound vac in place.In CIR. 2. ESRD:Continue usual MWF schedule- next HD Monday. Adding back in heparin d/t circuit clotting with last HD (had been on hold after surgery). 3. Hypotension/volume:BPlow/stable on admission, uses midodrine pre-HD.will use 76kg as his edw. 4. Anemia:Hgb>12, no ESA indicated. 5. Metabolic bone disease:Ca/Phosat goal- continue home binders and calcitriol. Parasbiv not on formulary, follow calcium, labs pending 6. Nutrition: Alb low -continueNepro protein supps. 7. T2DM 8. CAD  Gean Quint, MD [facility] 11/30/2019, 10:17 AM

## 2019-11-30 NOTE — Discharge Summary (Signed)
Physician Discharge Summary  Patient ID: Jon Anderson MRN: [id number] DOB/AGE: 05/31/1950 69 y.o.  Admit date: 11/25/2019 Discharge date: 12/02/2019  Discharge Diagnoses:  Principal Problem:   S/P transmetatarsal amputation of foot, right ([place]) Active Problems:   CAD (coronary artery disease) nonobstructive per cath 2012   ESRD (end stage renal disease) on dialysis [facility])   Right below-knee amputee ([facility]) Acute on chronic anemia Orthostasis Chronic constipation CAD with CABG Diabetes mellitus  Discharged Condition: Stable  Significant Diagnostic Studies: XR Foot 2 Views Right  Result Date: 11/04/2019 2 view radiographs of the right foot shows extensive calcification of the arteries out to the tips of his toes no destructive bony changes of the fourth metatarsal head but there is destruction of the tuft of the great toe consistent with chronic osteomyelitis.   Labs:  Basic Metabolic Panel: Recent Labs  Lab 11/26/19 1356 11/28/19 1422 12/01/19 1225  NA 132* 134* 134*  K 5.5* 4.9 5.2*  CL 86* 90* 92*  CO2 30 28 25   GLUCOSE 112* 101* 97  BUN 66* 55* 56*  CREATININE 10.66* 10.73* 11.48*  CALCIUM 9.1 9.4 9.5  PHOS 5.1* 4.5 4.3    CBC: Recent Labs  Lab 11/26/19 1357 11/28/19 1422 12/01/19 1225  WBC 9.1 9.2 8.3  HGB 12.0* 11.2* 10.7*  HCT 36.7* 35.3* 33.9*  MCV 93.6 94.4 92.9  PLT 189 254 329    CBG: Recent Labs  Lab 11/26/19 0617 11/28/19 1210  GLUCAP 135* 76    Brief HPI:   Jon Anderson is a 69 y.o. right-handed male with history of CAD with CABG 2019, end-stage renal disease with hemodialysis type 2 diabetes mellitus, hyperlipidemia, chronic constipation, peripheral vascular disease with right fifth 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  Per chart review lives with spouse independent with assistive device and still drives.  1 level home 3 steps to entry.  Presented 11/19/2019 with ulceration of the fourth  metatarsal and tip of the right great toe.  No change with conservative care and underwent right transmetatarsal amputation 11/19/2019 per Dr. Sharol Given.  Wound VAC as directed.  Hemodialysis ongoing.  Remain on aspirin and Plavix with prior to admission.  Therapy evaluations completed and patient was admitted for a comprehensive rehab program.   Hospital Course: TAYVIAN [facility]CROSS was admitted to rehab 11/25/2019 for inpatient therapies to consist of PT, ST and OT at least three hours five days a week. Past admission physiatrist, therapy team and rehab RN have worked together to provide customized collaborative inpatient rehab.  Pertaining to patient's right transmetatarsal amputation 11/19/2019 wound VAC completed 11/26/2019 touchdown weightbearing right lower extremity.  Patient remain on aspirin Plavix prior to admission.  Pain control with the use of scheduled Neurontin as well as oxycodone as needed.  Acute on chronic anemia stable follow-up CBC with hemodialysis.  Noted history of orthostasis ProAmatine as advised.  Hemodialysis as per renal services.  Chronic constipation with laxative assistance as directed.  CAD with CABG 2019 aspirin therapy ongoing.  Noted history of diabetes mellitus not on medication since 2019 hemodialysis keeps it controlled appears borderline control prior to admit on acute about patient currently was not checking CBGs.   Blood pressures were monitored on TID basis and controlled  Diabetes has been monitored with ac/hs CBG checks and SSI was use prn for tighter BS control.    Rehab course: During patient's stay in rehab weekly team conferences were held to monitor patient's progress, set goals and discuss  barriers to discharge. At admission, patient required minimal assist 20 feet rolling walker minimal assist sit to stand.  Minimal assist toileting set up for grooming  Physical exam.  Blood pressure 136/70 pulse 73 temperature 98.3 respirations 18 oxygen saturation 90% room  air Constitutional alert oriented no distress HEENT Head.  Normocephalic and atraumatic Eyes.  Pupils round and reactive to light no discharge.nystagmus Neck.  Supple nontender no JVD without thyromegaly Cardiac regular rate rhythm without any extra sounds or murmur heard Abdomen.  Soft nontender positive bowel sounds without rebound Respiratory effort normal no respiratory distress without wheeze Musculoskeletal.  Upper extremities 5/5 deltoids biceps triceps Lower extremities left lower extremity 5/5 knee flexion knee extension hip flexion dorsi flexion plantar flexion Right lower extremity hip flexion 3+/5 knee extension 4 -/5 knee flexion 4 -/5 Skin.  Right transmetatarsal amputation site with wound VAC in place appropriately tender Neurologic patient alert and oriented x3  He/ has had improvement in activity tolerance, balance, postural control as well as ability to compensate for deficits. He/has had improvement in functional use RUE/LUE  and RLE/LLE as well as improvement in awareness.  Ambulates 50 feet supervision assist Darco boot maintaining weightbearing precautions.  Ambulates to the bathroom using rolling walker contact-guard assist contact-guard assist for balance.  Patient able to complete pericare contact-guard assist for balance and standing to wash buttocks patient washed hands and face from wheelchair to sink.  Full family teaching completed plan discharged home       Disposition: Discharge to home    Diet: Renal diet  Special Instructions: No driving smoking or alcohol  Continue hemodialysis as directed  Touchdown weightbearing right lower extremity  Medications at discharge 1.  Tylenol as needed 2.  Aspirin 81 mg p.o. daily 3.  Lipitor 40 mg p.o. daily 4.  Wrote controlled 1.25 mcg Monday Wednesday Friday 5.  PhosLo 133 4 mg p.o. 3 times daily 6.  Plavix 75 mg p.o. daily 7.  Neurontin 100 mg p.o. twice daily 8.  Creon 36,000 units p.o. daily 9.   ProAmatine 5 mg Monday Wednesday Friday 10.  Rena-Vite tablet daily 11.Naldemedine Tosylate 0.2 mg daily as needed constipation 12.  Oxycodone 5 to 10 mg every 4 hours as needed pain 13.  Protonix 40 mg p.o. daily 14.  Senokot S1 tablet p.o. twice daily 15.Trental 400 mg daily   30-35 minutes were spent completing discharge summary and discharge planning     Follow-up Information    Meredith Staggers, MD Follow up.   Specialty: Physical Medicine and Rehabilitation Why: Only as directed Contact information: [address] [place] [address] [phone]        Newt Minion, MD Follow up.   Specialty: Orthopedic Surgery Why: Call for appointment Contact information: [place] [address] [phone]        Roney Jaffe, MD Follow up.   Specialty: Nephrology Why: Call for appointment Contact information: [place] [address] [phone]               Signed: Lavon Paganini [place] 12/02/2019, 5:08 AM

## 2019-12-01 ENCOUNTER — Inpatient Hospital Stay (HOSPITAL_COMMUNITY): Payer: Medicare Other

## 2019-12-01 ENCOUNTER — Inpatient Hospital Stay (HOSPITAL_COMMUNITY): Payer: Medicare Other | Admitting: Physical Therapy

## 2019-12-01 ENCOUNTER — Inpatient Hospital Stay (HOSPITAL_COMMUNITY): Payer: Medicare Other | Admitting: Occupational Therapy

## 2019-12-01 ENCOUNTER — Encounter (HOSPITAL_COMMUNITY): Payer: Medicare Other | Admitting: Psychology

## 2019-12-01 LAB — CBC
HCT: 33.9 % — ABNORMAL LOW (ref 39.0–52.0)
Hemoglobin: 10.7 g/dL — ABNORMAL LOW (ref 13.0–17.0)
MCH: 29.3 pg (ref 26.0–34.0)
MCHC: 31.6 g/dL (ref 30.0–36.0)
MCV: 92.9 fL (ref 80.0–100.0)
Platelets: 329 10*3/uL (ref 150–400)
RBC: 3.65 MIL/uL — ABNORMAL LOW (ref 4.22–5.81)
RDW: 14.3 % (ref 11.5–15.5)
WBC: 8.3 10*3/uL (ref 4.0–10.5)
nRBC: 0 % (ref 0.0–0.2)

## 2019-12-01 LAB — RENAL FUNCTION PANEL
Albumin: 3.1 g/dL — ABNORMAL LOW (ref 3.5–5.0)
Anion gap: 17 — ABNORMAL HIGH (ref 5–15)
BUN: 56 mg/dL — ABNORMAL HIGH (ref 8–23)
CO2: 25 mmol/L (ref 22–32)
Calcium: 9.5 mg/dL (ref 8.9–10.3)
Chloride: 92 mmol/L — ABNORMAL LOW (ref 98–111)
Creatinine, Ser: 11.48 mg/dL — ABNORMAL HIGH (ref 0.61–1.24)
GFR calc Af Amer: 5 mL/min — ABNORMAL LOW (ref >60)
GFR calc non Af Amer: 4 mL/min — ABNORMAL LOW (ref >60)
Glucose, Bld: 97 mg/dL (ref 70–99)
Phosphorus: 4.3 mg/dL (ref 2.5–4.6)
Potassium: 5.2 mmol/L — ABNORMAL HIGH (ref 3.5–5.1)
Sodium: 134 mmol/L — ABNORMAL LOW (ref 135–145)

## 2019-12-01 LAB — HEPATITIS B SURFACE ANTIGEN: Hepatitis B Surface Ag: NONREACTIVE

## 2019-12-01 MED ORDER — ATORVASTATIN CALCIUM 40 MG PO TABS: 40.0000 mg | ORAL_TABLET | Freq: Every day | ORAL | 5 refills | Status: DC

## 2019-12-01 MED ORDER — PANTOPRAZOLE SODIUM 40 MG PO TBEC: 40.0000 mg | DELAYED_RELEASE_TABLET | Freq: Every day | ORAL | 0 refills | Status: DC

## 2019-12-01 MED ORDER — CLOPIDOGREL BISULFATE 75 MG PO TABS: 75.0000 mg | ORAL_TABLET | Freq: Every day | ORAL | 0 refills | Status: DC

## 2019-12-01 MED ORDER — PANCRELIPASE (LIP-PROT-AMYL) 36000-114000 UNITS PO CPEP: 36000.0000 [IU] | ORAL_CAPSULE | Freq: Every day | ORAL | 0 refills | Status: DC

## 2019-12-01 MED ORDER — CALCIUM ACETATE (PHOS BINDER) 667 MG PO CAPS: 1334.0000 mg | ORAL_CAPSULE | Freq: Two times a day (BID) | ORAL | 0 refills | Status: AC

## 2019-12-01 MED ORDER — PENTOXIFYLLINE ER 400 MG PO TBCR: 400.0000 mg | EXTENDED_RELEASE_TABLET | Freq: Every day | ORAL | 3 refills | Status: DC

## 2019-12-01 MED ORDER — ACETAMINOPHEN 325 MG PO TABS: 325.0000 mg | ORAL_TABLET | Freq: Four times a day (QID) | ORAL | Status: DC | PRN

## 2019-12-01 MED ORDER — MIDODRINE HCL 5 MG PO TABS: 5.0000 mg | ORAL_TABLET | ORAL | 0 refills | Status: DC

## 2019-12-01 MED ORDER — RENA-VITE PO TABS: 1.0000 | ORAL_TABLET | Freq: Every day | ORAL | 0 refills | Status: DC

## 2019-12-01 MED ORDER — CALCITRIOL 0.25 MCG PO CAPS: 1.2500 ug | ORAL_CAPSULE | ORAL | 0 refills | Status: DC

## 2019-12-01 MED ORDER — HEPARIN SODIUM (PORCINE) 1000 UNIT/ML DIALYSIS: 20.0000 [IU]/kg | INTRAMUSCULAR | Status: DC | PRN

## 2019-12-01 MED ORDER — GABAPENTIN 100 MG PO CAPS: 100.0000 mg | ORAL_CAPSULE | Freq: Two times a day (BID) | ORAL | 0 refills | Status: DC

## 2019-12-01 MED ORDER — OXYCODONE HCL 5 MG PO TABS: 5.0000 mg | ORAL_TABLET | ORAL | 0 refills | Status: DC | PRN

## 2019-12-01 NOTE — Progress Notes (Signed)
Occupational Therapy Discharge Summary  Patient Details  Name: Jon Anderson MRN: [id number] Date of Birth: May 10, 1950  Today's Date: 12/01/2019 OT Individual Time: 1023-1101 OT Individual Time Calculation (min): 38 min    Patient has met 5 of 7 long term goals due to improved activity tolerance, improved balance and ability to compensate for deficits.  Patient to discharge at overall Supervision level with ambulation and Mod I from wc level.  Patient's care partner is independent to provide the necessary light physical  assistance at discharge.    Reasons goals not met: Patient currently functioning at supervision A for dynamic standing balance and sit to stand transfers 2/2 patient preference for NWB status in RLE due to pain.    Recommendation:  Patient will benefit from ongoing skilled OT services in home health setting to continue to advance functional skills in the area of BADL and iADL.  Equipment: TTB  Reasons for discharge: treatment goals met and discharge from hospital  Patient/family agrees with progress made and goals achieved: Yes   Skilled Intervention Patient met seated in wc with RN and MD present for wound assessment. Patient missed 22 minutes of skilled OT treatment session. Supervision A for functional mobility to walk-in shower in bathroom. UB/LB bathing/dressing in sitting with Mod I on TTB/wc. IV site and RLE wrapped. Patient completed grooming seated at sink level with Mod I. Session concluded with patient seated in wc with call bell within reach and all needs met.   OT Discharge Precautions/Restrictions  Precautions Precautions: Fall Required Braces or Orthoses: Other Brace Other Brace: post op shoe RLE Restrictions Weight Bearing Restrictions: Yes RLE Weight Bearing: Touchdown weight bearing (In post-op shoe) Pain Pain Assessment Pain Scale: 0-10 Pain Score: 6  Pain Type: Surgical pain Pain Location: Foot Pain Orientation: Right Pain  Descriptors / Indicators: Pins and needles Pain Onset: On-going ADL ADL Eating: Independent Where Assessed-Eating: Edge of bed Grooming: Independent Where Assessed-Grooming: Wheelchair Upper Body Bathing: Setup Where Assessed-Upper Body Bathing: Shower Lower Body Bathing: Setup Where Assessed-Lower Body Bathing: Shower Upper Body Dressing: Independent Where Assessed-Upper Body Dressing: Sitting at sink Lower Body Dressing: Independent Where Assessed-Lower Body Dressing: Standing at sink, Sitting at sink Toileting: Modified independent Where Assessed-Toileting: Glass blower/designer: Diplomatic Services operational officer Method: Counselling psychologist: Geophysical data processor: Modified independent Social research officer, government Method: Heritage manager: Gaffer Baseline Vision/History: Wears glasses Wears Glasses: Reading only Patient Visual Report: No change from baseline Vision Assessment?: No apparent visual deficits Perception  Perception: Within Functional Limits Praxis Praxis: Intact Cognition Overall Cognitive Status: Within Functional Limits for tasks assessed Arousal/Alertness: Awake/alert Orientation Level: Oriented X4 Selective Attention: Appears intact Memory: Appears intact Memory Recall Sock: Without Cue Memory Recall Blue: Without Cue Memory Recall Bed: Without Cue Awareness: Appears intact Problem Solving: Appears intact Safety/Judgment: Appears intact Sensation Sensation Light Touch: Appears Intact Hot/Cold: Appears Intact Proprioception: Appears Intact Coordination Gross Motor Movements are Fluid and Coordinated: No Fine Motor Movements are Fluid and Coordinated: Yes Coordination and Movement Description: generalized weakness and limited by TDWB RLE Motor  Motor Motor: Abnormal postural alignment and control Motor - Skilled Clinical Observations: generalized weakness Motor -  Discharge Observations: generalized weakness/deconditioning Mobility  Bed Mobility Bed Mobility: Supine to Sit;Sit to Supine;Rolling Right;Rolling Left Rolling Right: Independent Rolling Left: Independent Supine to Sit: Independent Sit to Supine: Independent Transfers Sit to Stand: Supervision/Verbal cueing Stand to Sit: Supervision/Verbal cueing  Trunk/Postural Assessment  Cervical Assessment Cervical Assessment: Exceptions  to [facility] (forward head) Thoracic Assessment Thoracic Assessment: Exceptions to [facility] (rounded shoulders) Postural Control Postural Control: Deficits on evaluation Righting Reactions: delayed 2/2 weight bearing restrictions Protective Responses: delayed 2/2 weight bearing restrictions  Balance Balance Balance Assessed: Yes Static Sitting Balance Static Sitting - Level of Assistance: 7: Independent Dynamic Sitting Balance Dynamic Sitting - Level of Assistance: 7: Independent Dynamic Standing Balance Dynamic Standing - Balance Support: Bilateral upper extremity supported;During functional activity Dynamic Standing - Level of Assistance: 5: Stand by assistance Extremity/Trunk Assessment RUE Assessment RUE Assessment: Within Functional Limits LUE Assessment LUE Assessment: Within Functional Limits   Hayden Kihara R [place]Davis 12/01/2019, 11:01 AM

## 2019-12-01 NOTE — Progress Notes (Addendum)
Patient ID: Jon Anderson, male   DOB: December 19, 1950, 69 y.o.   MRN: [id number]  HHA declined- [facility] (due to staffing for nursing), [facility], [facility], [facility].  SW spoke with [facility] [phone]) to discuss HHPT/OT/SN/aide referral (if available). SW waiting on follow-up as all nursing orders go directly to branch.  *branch declined due to wound care needs.   SW sent referral to [facility] and waiting on follow-up.   *SW returned phone call to pt dtr Jon Anderson to discuss discharge plan about DME and efforts as working on [facility]. SW informed there will be updates once HHA is in place.   Jon Anderson, MSW, [place] Office: [phone] Cell: [phone] Fax: [phone]

## 2019-12-01 NOTE — Progress Notes (Signed)
Physical Therapy Discharge Summary  Patient Details  Name: Jon Anderson MRN: [id number] Date of Birth: 07/22/1950  Today's Date: 12/01/2019 PT Individual Time: 0805-900 PT Individual Time Calculation (min): 55 min    Patient has met 10 of 11 long term goals due to improved activity tolerance, improved balance, improved postural control, increased strength and ability to compensate for deficits.  Patient to discharge at household ambulatory level with supervision with RW, w/c level with mod I.   Patient's care partner is independent to provide the necessary physical assistance at discharge.  Reasons goals not met: did not meet CGA stair goal as pt requires min assist 2/2 impaired strength & balance  Recommendation:  Patient will benefit from ongoing skilled PT services in home health setting to continue to advance safe functional mobility, address ongoing impairments in balance, gait, endurance, strengthening, stair negotiation, and minimize fall risk.  Equipment: 18x18 w/c (pt already has RW)  Reasons for discharge: treatment goals met  Patient/family agrees with progress made and goals achieved: Yes  Skilled PT Treatment: Pt received in chair with wife & daughter present for caregiver training. PT made aware of need for new off loading shoe so contacted [employer] account manager) & he plans to bring one in today. Educated pt & family on need to ensure clear walking paths for pt at home, f/u HHPT & DME (18x18 w/c), HEP, household ambulation with RW vs w/c for community use. Pt completes sit<>stand transfers with RW & supervision and ambulates ~100 ft with RW & supervision with pt electing to maintain NWB during gait. Pt completes car transfer at small sedan simulated height with RW & supervision, negotiates ramp with RW & supervision, & negotiates mulch with RW & CGA. Educated family on recommendation for pt to use back door to access house. PT assisted pt with negotiating 3" step to  simulate household threshold with pt electing to maintain NWB & "hopping" up step with RW & min assist for first trial, then attempts TDWB for stair negotiation (but question pt's ability to limit weight bearing during task) with RW & min assist. Pt & daughter return demo task. Back in room pt completes supine<>sit with independence. Pt & family voice no questions/concerns re: d/c home tomorrow. Pt left in w/c with chair alarm donned, call bell in reach, family present in room.  PT Discharge Precautions/Restrictions Precautions Precautions: Fall Other Brace: post op shoe RLE Restrictions Weight Bearing Restrictions: Yes RLE Weight Bearing: Touchdown weight bearing (in post op shoe)  Pain No formal c/o pain during session  Vision/Perception  Pt wears glasses for reading only at baseline. No changes in baseline vision.  Perception WNL. Praxis intact.   Cognition Overall Cognitive Status: Within Functional Limits for tasks assessed Arousal/Alertness: Awake/alert Orientation Level: Oriented X4 Memory: Appears intact Awareness: Appears intact Problem Solving: Appears intact Safety/Judgment: Appears intact  Sensation Sensation Light Touch: Appears Intact Proprioception: Appears Intact Coordination Gross Motor Movements are Fluid and Coordinated: No Fine Motor Movements are Fluid and Coordinated: Yes Coordination and Movement Description: generalized weakness and limited by TDWB RLE  Motor  Motor Motor - Skilled Clinical Observations: generalized weakness Motor - Discharge Observations: generalized weakness/deconditioning   Mobility Bed Mobility Bed Mobility: Supine to Sit;Sit to Supine;Rolling Right;Rolling Left Rolling Right: Independent (per pt report) Rolling Left: Independent (per pt report) Supine to Sit: Independent Sit to Supine: Independent Transfers Transfers: Sit to Stand;Stand to Sit Sit to Stand: Supervision/Verbal cueing Stand to Sit: Supervision/Verbal  cueing Transfer (Assistive device): Rolling  walker  Locomotion  Gait Ambulation: Yes Gait Assistance: Supervision/Verbal cueing Gait Distance (Feet): 100 Feet Assistive device: Rolling walker Gait Assistance Details: pt elects to maintain NWB RLE Gait Gait velocity: decreased Stairs / Additional Locomotion Stairs: Yes Stairs Assistance: Minimal Assistance - Patient > 75% Stair Management Technique: No rails;With walker Number of Stairs: 1 Height of Stairs: 3 (inches) Ramp: Supervision/Verbal cueing (ambulatory with RW) Wheelchair Mobility Wheelchair Mobility: Yes Wheelchair Assistance: Independent with Camera operator: Both upper extremities Wheelchair Parts Management: Supervision/cueing (cuing to ensure brakes are locked) Distance: 150   Trunk/Postural Assessment  Cervical Assessment Cervical Assessment: Exceptions to [facility] (forward head) Thoracic Assessment Thoracic Assessment: Exceptions to [facility] (rounded shoulders) Postural Control Postural Control: Deficits on evaluation Righting Reactions: delayed 2/2 weight bearing restrictions Protective Responses: delayed 2/2 weight bearing restrictions   Balance Balance Balance Assessed: Yes Dynamic Standing Balance Dynamic Standing - Balance Support: Bilateral upper extremity supported;During functional activity Dynamic Standing - Level of Assistance: 5: Stand by assistance  Extremity Assessment  RUE Assessment RUE Assessment: Within Functional Limits LUE Assessment LUE Assessment: Within Functional Limits RLE Assessment Active Range of Motion (AROM) Comments: WFL General Strength Comments: not tested LLE Assessment LLE Assessment: Within Functional Limits    Waunita Schooner 12/01/2019, 12:20 PM

## 2019-12-01 NOTE — Consult Note (Signed)
Neuropsychological Consultation   Patient:   Jon Anderson   DOB:   31-Jan-1951  MR Number:  [id number]  Location:  [facility] [address] [place] [id number] [place] [address] Dept: [place]: [phone]           Date of Service:   12/01/2019  Start Time:   9 AM End Time:   10 AM  Provider/Observer:  Ilean Skill, Psy.D.       Clinical Neuropsychologist       Billing Code/Service: 62952  Chief Complaint:    Jon Anderson is a 69 year old male with history of CAD/CABG 2019, end-stage renal disease with hemodialysis Monday Wednesday and Friday, type 2 diabetes, hyperlipidemia, chronic constipation, peripheral vascular disease with right fifth 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  Patient presented on 11/19/2019 with ulceration of the fourth metatarsal and tip of the right great toe.  No change with conservative care and patient underwent right transmetatarsal amputation 11/19/2019.  There have been concerns with the patient dealing with depressive symptomatology and while the patient has denied being depressed behavioral observations suggest a reason to be concerned around depressive symptoms.  Reason for Service:  Patient was referred for neuropsychological consultation due to concerns over the development of depressive symptomatology.  Below is the HPI for the current admission.  HPI: Jon Anderson is a 69 year old right-handed male with history of CAD/CABG 2019, end-stage renal disease with hemodialysis Monday Wednesday Friday on [employer],, type 2 diabetes mellitus hyperlipidemia, chronic constipation, peripheral vascular disease with right fifth 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  Per chart review patient lives with spouse independent with assistive device and still drives.  1 level home 3 steps to entry.   Presented 11/19/2019 with ulceration of the fourth metatarsal and tip of the right great toe.  No change with conservative care and patient underwent right transmetatarsal amputation 11/19/2019 per Dr. Sharol Given.  Wound VAC as directed.  Hemodialysis ongoing as per renal services.  Patient remains on aspirin and Plavix as prior to admission.  Therapy evaluations completed and patient was admitted for a comprehensive rehab program.   Current Status:  Patient was sitting in his wheelchair when entering the room.  2 family members were in the room with him in anticipation of family training preparing for the patient's discharge tomorrow.  Speaking directly with Jon Anderson, he denied any significant depressive symptoms and reports that he is looking forward to his discharge.  He does acknowledge frustration and worry about his myriad of medical issues and recent surgeries and concern about possible further surgeries on his foot.  The patient reports that it is difficult to go for dialysis 3 times a week and his overall medical status does not leave a lot for a full life.  However, he denies severe depression and that he is just generally frustrated with everything.  The patient's family report that he is fairly consistent with the way he has been historically deny that the see significant change in his mood state.  Behavioral Observation: Jon Anderson  presents as a 69 y.o.-year-old Right African American Male who appeared his stated age. his dress was Appropriate and he was Well Groomed and his manners were Appropriate to the situation.  his participation was indicative of Appropriate and Inattentive behaviors.  There were any physical disabilities noted.  he displayed an appropriate level of cooperation and motivation.  Interactions:    Active Inattentive  Attention:   abnormal and attention span appeared shorter than expected for age  Memory:   within normal limits; recent and remote memory  intact  Visuo-spatial:  not examined  Speech (Volume):  low  Speech:   normal; normal  Thought Process:  Coherent and Relevant  Though Content:  WNL; not suicidal and not homicidal  Orientation:   person, place, time/date and situation  Judgment:   Fair  Planning:   Fair  Affect:    Blunted and Lethargic  Mood:    Dysphoric  Insight:   Fair  Intelligence:   normal  Medical History:   Past Medical History:  Diagnosis Date  . Anemia of chronic disease   . CAD (coronary artery disease)    a.  Myoview 4/11: EF 53%, no scar or ischemia   c. MV 2012 Nl perfusion, apical thinning.  No ischemia or scar.  EF 49%, appears greater by visual estimate.;  d.  Dob stress echo 12/13:  Negative Dob stress echo. There is no evidence of ischemia.  The LVF is normal. b. Normal cors 2016.  . Carotid stenosis    a. <16% RICA, >10% LICA by duplex 12/6043  . Chronic chest pain    occ  . Constipation    chronic  . Dyspnea    with extertion  . ESRD (end stage renal disease) on dialysis [facility])    M-W-F- [name]  . GERD (gastroesophageal reflux disease)   . History of kidney stones    Passed  . HNP (herniated nucleus pulposus), lumbar   . HTN (hypertension)    echo 3/10: EF 60%, LAE  . Hyperlipidemia   . Nephrolithiasis    "passed them all"  . Peripheral arterial disease ([place])    a. s/p PTCA  right   . Pneumonia yrs ago  . Restless legs   . Sciatic leg pain   . Snores    a. presumed OSA, pt has refused sleep eval in past.  . Type II diabetes mellitus ([place])    no longer on medications, checks blood glucose at home  . Urinary frequency    Psychiatric History:  Patient denies any past psychiatric history but does have a lot of significant medical issues going on.  As the patient is being discharged tomorrow I do not think we should really make any attempts at changing psychotropic medications (he is not on any now) but should look at making a referral for follow-up psychiatric  consultation if the patient is agreeable.  This could also be addressed by his PCP.  Family Med/Psych History:  Family History  Problem Relation Age of Onset  . Heart attack Sister        died @ 22  . Cancer Mother        died @ 66; unknown type  . Diabetes Brother        deceased  . Cirrhosis Father        alcohol related  . Diabetes Father   . Esophageal cancer Neg Hx   . Colon cancer Neg Hx   . Pancreatic cancer Neg Hx   . Stomach cancer Neg Hx     Risk of Suicide/Violence: low patient denies any suicidal or homicidal ideation.  Impression/DX:  Jon Anderson is a 69 year old male with history of CAD/CABG 2019, end-stage renal disease with hemodialysis Monday Wednesday and Friday, type 2 diabetes, hyperlipidemia, chronic constipation, peripheral vascular disease with right fifth ray  amputation 07/25/2019 as well as recent revascularization procedure.  Patient presented on 11/19/2019 with ulceration of the fourth metatarsal and tip of the right great toe.  No change with conservative care and patient underwent right transmetatarsal amputation 11/19/2019.  There have been concerns with the patient dealing with depressive symptomatology and while the patient has denied being depressed behavioral observations suggest a reason to be concerned around depressive symptoms.  Patient was sitting in his wheelchair when entering the room.  2 family members were in the room with him in anticipation of family training preparing for the patient's discharge tomorrow.  Speaking directly with Jon Anderson, he denied any significant depressive symptoms and reports that he is looking forward to his discharge.  He does acknowledge frustration and worry about his myriad of medical issues and recent surgeries and concern about possible further surgeries on his foot.  The patient reports that it is difficult to go for dialysis 3 times a week and his overall medical status does not leave a lot for a full life.   However, he denies severe depression and that he is just generally frustrated with everything.  The patient's family report that he is fairly consistent with the way he has been historically deny that the see significant change in his mood state.  Disposition/Plan:  The patient does appear to be having an emotional response that is generally consistent with an appropriate with what he is going through emotionally.  He does not appear to be clinically depressed.  However, this is something that should probably be followed up by his aftercare team post discharge.  The patient is being discharged soon and started any types of interventions at this point would not be able to be followed through under this setting.  Again, I do not think he is severely depressed her suffering a clinical depression but a normal adjustment reaction to what is going on with him medically.  Diagnosis:    Adjustment Reaction        Electronically Signed   _______________________ Ilean Skill, Psy.D.

## 2019-12-01 NOTE — Progress Notes (Signed)
  [facility] Progress Note   Subjective: No acute events, seen on HD  Objective Vitals:   12/01/19 1530 12/01/19 1600 12/01/19 1630 12/01/19 1700  BP: 108/67 101/67 (!) 105/38 111/72  Pulse: 84 81 84 90  Resp:      Temp:      TempSrc:      SpO2:      Weight:      Height:       Physical Exam General: Well appearing man, NAD, laying flat in bed Heart: reg rate Lungs: normal wob, bl chest expansion Abdomen: soft, non-tender Extremities: No LLE edema, R foot wrapped + wound vac in place Dialysis Access: LUE AVF + bruit  Additional Objective Labs: Basic Metabolic Panel: Recent Labs  Lab 11/26/19 1356 11/28/19 1422 12/01/19 1225  NA 132* 134* 134*  K 5.5* 4.9 5.2*  CL 86* 90* 92*  CO2 30 28 25   GLUCOSE 112* 101* 97  BUN 66* 55* 56*  CREATININE 10.66* 10.73* 11.48*  CALCIUM 9.1 9.4 9.5  PHOS 5.1* 4.5 4.3   Liver Function Tests: Recent Labs  Lab 11/26/19 1356 11/28/19 1422 12/01/19 1225  ALBUMIN 3.1* 3.1* 3.1*   CBC: Recent Labs  Lab 11/26/19 1357 11/28/19 1422 12/01/19 1225  WBC 9.1 9.2 8.3  HGB 12.0* 11.2* 10.7*  HCT 36.7* 35.3* 33.9*  MCV 93.6 94.4 92.9  PLT 189 254 329   Medications:  . aspirin EC  81 mg Oral Daily  . atorvastatin  40 mg Oral q1800  . calcitRIOL  1.25 mcg Oral Q M,W,F-HD  . calcium acetate  1,334 mg Oral BID WC  . Chlorhexidine Gluconate Cloth  6 each Topical Q0600  . clopidogrel  75 mg Oral Daily  . feeding supplement (NEPRO CARB STEADY)  237 mL Oral BID BM  . gabapentin  100 mg Oral BID  . lipase/protease/amylase  36,000 Units Oral Daily  . midodrine  5 mg Oral Q M,W,F-HD  . multivitamin  1 tablet Oral QHS  . pantoprazole  40 mg Oral Daily  . senna-docusate  1 tablet Oral BID    Dialysis Orders: MWF @ [facility] 4hr, 450/800, EDW 71.5kg, 2K/2Ca, AVF, UFP #2, heparin 5000 - Parsabiv 2.66mcg IV q HD - Calcitriol 1.46mcg PO q HD  Assessment/Plan: 1. 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 Per [facility]. Wound  vac in place.In CIR. 2. ESRD:Continue usual MWF schedule- next HD Monday. Adding back in heparin d/t circuit clotting with last HD (had been on hold after surgery). 3. Hypotension/volume:BPlow/stable on admission, uses midodrine pre-HD.will use 76kg as his edw. 4. Anemia:Hgb>12, no ESA indicated. 5. Metabolic bone disease:Ca/Phosat goal- continue home binders and calcitriol. Parasbiv not on formulary, follow calcium, labs pending 6. Nutrition: Alb low -continueNepro protein supps. 7. T2DM 8. CAD  Kelly Splinter, MD 12/01/2019, 5:15 PM

## 2019-12-01 NOTE — Plan of Care (Signed)
  Problem: RH Balance Goal: LTG Patient will maintain dynamic standing with ADLs (OT) Description: LTG:  Patient will maintain dynamic standing balance with assist during activities of daily living (OT)  Outcome: Not Met (Supervision A for dynamic standing balance for safety given patients preference for NWB in RLE)   Problem: Sit to Stand Goal: LTG:  Patient will perform sit to stand in prep for activites of daily living with assistance level (OT) Description: LTG:  Patient will perform sit to stand in prep for activites of daily living with assistance level (OT) Outcome: Not Met (Supervision A for dynamic standing balance for safety given patients preference for NWB in RLE)

## 2019-12-01 NOTE — Progress Notes (Signed)
Physical Therapy Session Note  Patient Details  Name: Jon Anderson MRN: [id number] Date of Birth: September 11, 1950  Today's Date: 12/01/2019 PT Individual Time: 1100-1200 PT Individual Time Calculation (min): 60 min   Short Term Goals: Week 1:  PT Short Term Goal 1 (Week 1): STG = LTG due to short ELOS.  Skilled Therapeutic Interventions/Progress Updates:    Pt received sitting in w/c brushing his teeth at the sink, OT handoff for care. Pt denies pain. Pt's new w/c arrived shortly after PT, reviewed with patient the features of w/c such as donning/doffing leg rests and extended brake levers and how to fold the chair. Pt performed stand-pivot from w/c to new w/c with supervision and no AD. Propelled himself mod I in hallways on the floor to the elevator where we went outside near [facility] to practice w/c mobility in the community. Pt propelled manual w/c >253ft with supervision while outdoors on unlevel brick pavers and up/down moderately inclined ramp. After seated rest, pt able to hop ~179ft with supervision and RW while outdoors on unlevel pavers, a few standing rest breaks 2/2 fatigue but no LOB or knee buckling, reports moderate muscular fatigue primarily in his shoulders. Pt transported in his w/c with totalA back to his room for time management as he requested to change into more comfortable clothes prior to his dialysis. While seated in his w/c, he performed upper/lower body dressing with supervision, requiring to stand to RW for donning pants. Pt ended session sitting in his w/c, seat belt alarm on, needs in reach.  Therapy Documentation Precautions:  Precautions Precautions: Fall Precaution Comments: wound vac, R foot Required Braces or Orthoses: Other Brace Other Brace: post op shoe RLE Restrictions Weight Bearing Restrictions: Yes RLE Weight Bearing: Touchdown weight bearing (In post-op shoe) Other Position/Activity Restrictions: wound vac  Therapy/Group: Individual  Therapy  Demarie Uhlig P Maddox Hlavaty PT 12/01/2019, 12:08 PM

## 2019-12-01 NOTE — Progress Notes (Addendum)
[place] PHYSICAL MEDICINE & REHABILITATION PROGRESS NOTE   Subjective/Complaints: Has throbbing pain in foot. Medication helps.  Still with constipation. Getting Senna-docusate BID. Had a BM last night. Discussed trying prune juice with meals.   ROS: Patient denies fever, rash, sore throat, blurred vision, nausea, vomiting, diarrhea, cough, shortness of breath or chest pain, joint or back pain, headache, or mood change.   Objective:   No results found. Recent Labs    11/28/19 1422  WBC 9.2  HGB 11.2*  HCT 35.3*  PLT 254   Recent Labs    11/28/19 1422  NA 134*  K 4.9  CL 90*  CO2 28  GLUCOSE 101*  BUN 55*  CREATININE 10.73*  CALCIUM 9.4    Intake/Output Summary (Last 24 hours) at 12/01/2019 1000 Last data filed at 12/01/2019 0900 Gross per 24 hour  Intake 664 ml  Output --  Net 664 ml     Physical Exam: Vital Signs Blood pressure 138/69, pulse 91, temperature 98.2 F (36.8 C), resp. rate 16, height 5\' 11"  (1.803 m), weight 76 kg, SpO2 98 %. General: Alert and oriented x 3, No apparent distress HEENT: Head is normocephalic, atraumatic, PERRLA, EOMI, sclera anicteric, oral mucosa pink and moist, dentition intact, ext ear canals clear,  Neck: Supple without JVD or lymphadenopathy Heart: Reg rate and rhythm. No murmurs rubs or gallops Chest: CTA bilaterally without wheezes, rales, or rhonchi; no distress Abdomen: Soft, non-tender, non-distended, bowel sounds positive. Extremities: No clubbing, cyanosis, or edema. Pulses are 2+ Psych: pleasant and cooperative Musculoskeletal:     Cervical back: Normal range of motion. No rigidity.       Skin:    Comments: right TM wound clean with slight s/s drainage Neurological:     Comments: Alert and oriented x 3. Normal insight and awareness. Intact Memory. Normal language and speech. Cranial nerve exam unremarkable  Decreased sensation to light touch in RLE   Assessment/Plan: 1. Functional deficits secondary to s/p  transmetatarsal amputation of right foot, which require 3+ hours per day of interdisciplinary therapy in a comprehensive inpatient rehab setting.  Physiatrist is providing close team supervision and 24 hour management of active medical problems listed below.  Physiatrist and rehab team continue to assess barriers to discharge/monitor patient progress toward functional and medical goals  Care Tool:  Bathing    Body parts bathed by patient: Right arm, Left arm, Chest, Abdomen, Front perineal area, Buttocks, Right upper leg, Left upper leg, Right lower leg, Left lower leg, Face         Bathing assist Assist Level: Minimal Assistance - Patient > 75%     Upper Body Dressing/Undressing Upper body dressing   What is the patient wearing?: Pull over shirt, Button up shirt    Upper body assist Assist Level: Set up assist    Lower Body Dressing/Undressing Lower body dressing      What is the patient wearing?: Underwear/pull up, Pants     Lower body assist Assist for lower body dressing: Minimal Assistance - Patient > 75%     Toileting Toileting    Toileting assist Assist for toileting: Minimal Assistance - Patient > 75%     Transfers Chair/bed transfer  Transfers assist     Chair/bed transfer assist level: Supervision/Verbal cueing     Locomotion Ambulation   Ambulation assist      Assist level: Supervision/Verbal cueing Assistive device: Walker-rolling Max distance: 100 ft   Walk 10 feet activity   Assist  Assist level: Supervision/Verbal cueing Assistive device: Walker-rolling   Walk 50 feet activity   Assist Walk 50 feet with 2 turns activity did not occur: Safety/medical concerns  Assist level: Supervision/Verbal cueing Assistive device: Walker-rolling    Walk 150 feet activity   Assist Walk 150 feet activity did not occur: Safety/medical concerns         Walk 10 feet on uneven surface  activity   Assist     Assist level:  Contact Guard/Touching assist Assistive device: Walker-rolling   Wheelchair     Assist Will patient use wheelchair at discharge?: Yes Type of Wheelchair: Manual    Wheelchair assist level: Independent Max wheelchair distance: 150    Wheelchair 50 feet with 2 turns activity    Assist        Assist Level: Independent   Wheelchair 150 feet activity     Assist      Assist Level: Independent   Blood pressure 138/69, pulse 91, temperature 98.2 F (36.8 C), resp. rate 16, height 5\' 11"  (1.803 m), weight 76 kg, SpO2 98 %.    Medical Problem List and Plan: 1.  Decreased functional ability secondary to right transmetatarsal amputation 11/19/2019.  Wound VAC protocol to be completed 11/26/2019- TDWB on RLE             -patient may shower             -ELOS/Goals:  Mod I,  8/24  -Continue therapies 2.  Antithrombotics: -DVT/anticoagulation: SCD left lower extremity             -antiplatelet therapy: Aspirin 81 mg daily, Plavix 75 mg daily  -resuming trental? 3. Pain Management: Neurontin 100 mg twice daily, oxycodone as needed q4 hours prn 5-10 mg- might need more, was wincing in pain often- like hit by lightning 4. Mood: Provide emotional support             -antipsychotic agents: N/A 5. Neuropsych: This patient is capable of making decisions on his own behalf. 6. Skin/Wound Care: wound w/ serosanguinous drainage. Patient to be discharged tomorrow. Requested Dr. Sharol Given to take a look at wound prior to incision if possible.   -continue oil emersion dressing, gauze, ACE daily 7. Fluids/Electrolytes/Nutrition: Routine in and outs with follow-up chemistries 8.  Acute on chronic anemia.  f/u CBC with dialysis labs.   8/20 11.2 9.  Orthostasis with HD.  Continue ProAmatine 5 mg Monday Wednesday Friday 10.  End-stage renal disease.  Hemodialysis per renal services-usually M/W/F- after therapies  - basically anuric- forms urine every 2-3 days.   11.  Chronic constipation with  acute constipation- had loose stool after intervention  -resume senna-s bid. Had BM 8/22 night. Discussed prune juice with meals.   -continues on creon also 12.  CAD with CABG 2019.  Continue aspirin.  Denies chest pain or increasing shortness of breath 13. DM- not on meds since 2019- HD keeps it controlled it appears.   -borderline control prior to admit on acute  -f/u as outpt. Not checking cbg's LOS: 6 days A FACE TO FACE EVALUATION WAS PERFORMED  Saharsh Sterling P Kannon Granderson 12/01/2019, 10:00 AM

## 2019-12-02 NOTE — Progress Notes (Signed)
Patient ID: Jon Anderson, male   DOB: 10-02-1950, 69 y.o.   MRN: [id number]  SW received updates from [place] [facility] [phone]) stating able to accept referral. SW called pt wife Stanton Kidney [phone]) to inform on above.   SW met with pt in room to discuss above about HHA, and review transportation services through [place].  Loralee Pacas, MSW, [place] Office: [phone] Cell: [phone] Fax: [phone]

## 2019-12-02 NOTE — Progress Notes (Signed)
[place] PHYSICAL MEDICINE & REHABILITATION PROGRESS NOTE   Subjective/Complaints: No new complaints. Wound just re=dressed by surgery. Ready for dc  ROS: Patient denies fever, rash, sore throat, blurred vision, nausea, vomiting, diarrhea, cough, shortness of breath or chest pain,  headache, or mood change.    Objective:   No results found. Recent Labs    12/01/19 1225  WBC 8.3  HGB 10.7*  HCT 33.9*  PLT 329   Recent Labs    12/01/19 1225  NA 134*  K 5.2*  CL 92*  CO2 25  GLUCOSE 97  BUN 56*  CREATININE 11.48*  CALCIUM 9.5    Intake/Output Summary (Last 24 hours) at 12/02/2019 0920 Last data filed at 12/02/2019 0749 Gross per 24 hour  Intake 462 ml  Output 2500 ml  Net -2038 ml     Physical Exam: Vital Signs Blood pressure 126/65, pulse 76, temperature 98.3 F (36.8 C), resp. rate 14, height _0  (1.803 m), weight 75.2 kg, SpO2 97 %. Constitutional: No distress . Vital signs reviewed. HEENT: EOMI, oral membranes moist Neck: supple Cardiovascular: RRR without murmur. No JVD    Respiratory/Chest: CTA Bilaterally without wheezes or rales. Normal effort    GI/Abdomen: BS +, non-tender, non-distended Ext: no clubbing, cyanosis, or edema Psych: pleasant and cooperative Musculoskeletal:     Cervical back: Normal range of motion. No rigidity. Right foot still tender      Skin:    Comments: right TM wound just re-dressed. I did not re-open dressing Neurological:     Comments: Alert and oriented x 3. Normal insight and awareness. Intact Memory. Normal language and speech. Cranial nerve exam unremarkable  Decreased sensation to light touch in RLE ongoing  Assessment/Plan: 1. Functional deficits secondary to s/p transmetatarsal amputation of right foot, which require 3+ hours per day of interdisciplinary therapy in a comprehensive inpatient rehab setting.  Physiatrist is providing close team supervision and 24 hour management of active medical problems listed  below.  Physiatrist and rehab team continue to assess barriers to discharge/monitor patient progress toward functional and medical goals  Care Tool:  Bathing    Body parts bathed by patient: Right arm, Left arm, Chest, Abdomen, Front perineal area, Buttocks, Right upper leg, Left upper leg, Right lower leg, Left lower leg, Face         Bathing assist Assist Level: Independent with assistive device Assistive Device Comment: TTB, LHSH   Upper Body Dressing/Undressing Upper body dressing   What is the patient wearing?: Pull over shirt, Button up shirt    Upper body assist Assist Level: Independent Assistive Device Comment: Seated  Lower Body Dressing/Undressing Lower body dressing      What is the patient wearing?: Underwear/pull up, Pants     Lower body assist Assist for lower body dressing: Independent with assitive device Assistive Device Comment: RW   Toileting Toileting    Toileting assist Assist for toileting: Minimal Assistance - Patient > 75%     Transfers Chair/bed transfer  Transfers assist     Chair/bed transfer assist level: Supervision/Verbal cueing     Locomotion Ambulation   Ambulation assist      Assist level: Supervision/Verbal cueing Assistive device: Walker-rolling Max distance: 100 ft   Walk 10 feet activity   Assist     Assist level: Supervision/Verbal cueing Assistive device: Walker-rolling   Walk 50 feet activity   Assist Walk 50 feet with 2 turns activity did not occur: Safety/medical concerns  Assist level: Supervision/Verbal cueing Assistive  device: Walker-rolling    Walk 150 feet activity   Assist Walk 150 feet activity did not occur: Safety/medical concerns         Walk 10 feet on uneven surface  activity   Assist     Assist level: Contact Guard/Touching assist Assistive device: Walker-rolling   Wheelchair     Assist Will patient use wheelchair at discharge?: Yes Type of Wheelchair: Manual     Wheelchair assist level: Independent Max wheelchair distance: 150    Wheelchair 50 feet with 2 turns activity    Assist        Assist Level: Independent   Wheelchair 150 feet activity     Assist      Assist Level: Independent   Blood pressure 126/65, pulse 76, temperature 98.3 F (36.8 C), resp. rate 14, height _0  (1.803 m), weight 75.2 kg, SpO2 97 %.    Medical Problem List and Plan: 1.  Decreased functional ability secondary to right transmetatarsal amputation 11/19/2019.  Wound VAC protocol to be completed 11/26/2019- TDWB on RLE             -patient may shower             -dc home today, goals met  -f/u with ortho. NO PM&R f/u needed 2.  Antithrombotics: -DVT/anticoagulation: SCD left lower extremity             -antiplatelet therapy: Aspirin 81 mg daily, Plavix 75 mg daily  -resumption of trental can be discussed as outpt 3. Pain Management: Neurontin 100 mg twice daily, oxycodone as needed q4 hours prn 5-10 mg- might need more, was wincing in pain often- like hit by lightning 4. Mood: Provide emotional support             -antipsychotic agents: N/A 5. Neuropsych: This patient is capable of making decisions on his own behalf. 6. Skin/Wound Care: wound healing, continue dry dressing.   outpt ortho f/iu 7. Fluids/Electrolytes/Nutrition: Routine in and outs with follow-up chemistries 8.  Acute on chronic anemia.  f/u CBC with dialysis labs.   8/20 11.2 9.  Orthostasis with HD.  Continue ProAmatine 5 mg Monday Wednesday Friday 10.  End-stage renal disease.  Hemodialysis per renal services-usually M/W/F- after therapies  - basically anuric- forms urine every 2-3 days.   11.  Chronic constipation with acute constipation- had loose stool after intervention  -resumed senna-s bid. Had BM 8/22 night.   - prune juice with meals.   -continues on creon also  -will need to be aggressive at home with regimen 12.  CAD with CABG 2019.  Continue aspirin.  Denies chest  pain or increasing shortness of breath 13. DM- not on meds since 2019- HD keeps it controlled it appears.   -borderline control prior to admit on acute  -f/u as outpt.   LOS: 7 days A FACE TO FACE EVALUATION WAS PERFORMED  Jon Anderson 12/02/2019, 9:20 AM

## 2019-12-02 NOTE — Discharge Instructions (Signed)
Inpatient Rehab Discharge Instructions  Jon Anderson Discharge date and time: No discharge date for patient encounter.   Activities/Precautions/ Functional Status: Activity: As tolerated Diet: Renal diet Wound Care: Keep wound clean and dry Functional status:  ___ No restrictions     ___ Walk up steps independently ___ 24/7 supervision/assistance   ___ Walk up steps with assistance ___ Intermittent supervision/assistance  ___ Bathe/dress independently ___ Walk with walker     _x__ Bathe/dress with assistance ___ Walk Independently    ___ Shower independently ___ Walk with assistance    ___ Shower with assistance ___ No alcohol     ___ Return to work/school ________  COMMUNITY REFERRALS UPON DISCHARGE:    Home Health:   PT     OT    RN      SNA                       Agency: [facility]   Phone:  [facility] [phone]; liaison Sharmon Revere [phone] *Please expect follow-up within 2-3 days to discuss scheduling your home visit. If you have not received follow-up, please contact the liaison [place] and/or call branch directly.   Medical Equipment/Items Ordered: wheelchair and tub transfer bench                                                 Agency/Supplier: [phone]  GENERAL COMMUNITY RESOURCES FOR PATIENT/FAMILY: [facility] [place] for transportation services. Pre-certified for first 30 days due to dialysis! Be sure to call [phone] to schedule your reservation.  Special Instructions: No driving smoking or alcohol  Continue hemodialysis as directed   My questions have been answered and I understand these instructions. I will adhere to these goals and the provided educational materials after my discharge from the hospital.  Patient/Caregiver Signature _______________________________ Date __________  Clinician Signature _______________________________________ Date __________  Please bring this form and your medication list with you to all your  follow-up doctor's appointments.

## 2019-12-02 NOTE — Progress Notes (Signed)
Patient is status post transmetatarsal amputation.  He is getting ready for discharge.    Dressing was removed.  He has some moderate swelling but no cellulitis wound edges are well opposed with some bloody drainage.  No foul odor will order for wound to be washed with mild soap and water should apply dry dressing no Vaseline dressings to wound will need 1 week follow-up in our office

## 2019-12-02 NOTE — Patient Care Conference (Signed)
Inpatient RehabilitationTeam Conference and Plan of Care Update Date: 12/02/2019   Time: 10:49 AM    Patient Name: Jon Anderson      Medical Record Number: [id number]  Date of Birth: 12/06/1950 Sex: Male         Room/Bed: 4M07C/4M07C-01 Payor Info: Payor: [employer] / Plan: [employer] MEDICARE / Product Type: *No Product type* /    Admit Date/Time:  11/25/2019  3:48 PM  Primary Diagnosis:  S/P transmetatarsal amputation of foot, right [facility])  Hospital Problems: Principal Problem:   S/P transmetatarsal amputation of foot, right ([place]) Active Problems:   CAD (coronary artery disease) nonobstructive per cath 2012   ESRD (end stage renal disease) on dialysis [facility])   Right below-knee amputee [facility])    Expected Discharge Date: Expected Discharge Date: 12/02/19  Team Members Present: Physician leading conference: Dr. Alger Simons Care Coodinator Present: Loralee Pacas, LCSWA;Dannya Pitkin Creig Hines, RN, BSN, CRRN Nurse Present: Suella Grove, RN PT Present: Lavone Nian, PT OT Present: Elisabeth Most, OT SLP Present: Weston Anna, SLP PPS Coordinator present : Ileana Ladd, PT     Current Status/Progress Goal Weekly Team Focus  Bowel/Bladder   Pt is continent of bowel and bladder. Pt is anuric as well.  To remain continent of bowel and bladder.  Assess tolieting needs q2 or as needed.   Swallow/Nutrition/ Hydration             ADL's             Mobility   independent bed mobility, supervision household gait with RW, mod I w/c mobility, min assist single step with RW  supervision gait, CGA step, mod I w/c  pt/family education, d/c planning, strengthening, gait, stair negotiation   Communication             Safety/Cognition/ Behavioral Observations            Pain   Has complaints of pain from right foot due to metatarsal amputation. (8/10) Gets prn oxy IR q4 if needed.  To bring pain level to 2/10.  Assess pain q shift or prn.   Skin   Right  foot/metatarsals amputation. Daily dressing or prn change consists of using oil emersion, 4x4, kerlex and ace wrap  To promote healing, prevent breakdown and infection.  Assess skin q shift or prn, and daily dressings to be done.     Discharge Planning:  D/c to home with 24/7 care from wife who will provide sup level of care; their dtr lareese that lives in the home will help with physical assistance. Pt provided transportation resource ([facility]) to help with transport to/from dialysis.   Team Discussion: Continent B/B, Ready for discharge. Patient on target to meet rehab goals: yes  *See Care Plan and progress notes for long and short-term goals.   Revisions to Treatment Plan:  None  Teaching Needs: Family education complete.  Current Barriers to Discharge: None noted.  Possible Resolutions to Barriers: N/A     Medical Summary Current Status: right tma, pain better. local wound care, on hd for esrd.  Barriers to Discharge: Medical stability   Possible Resolutions to Barriers/Weekly Focus: finalizing meds for dc today   Continued Need for Acute Rehabilitation Level of Care: The patient requires daily medical management by a physician with specialized training in physical medicine and rehabilitation for the following reasons: Direction of a multidisciplinary physical rehabilitation program to maximize functional independence : Yes Medical management of patient stability for increased activity during participation in an  intensive rehabilitation regime.: Yes Analysis of laboratory values and/or radiology reports with any subsequent need for medication adjustment and/or medical intervention. : Yes   I attest that I was present, lead the team conference, and concur with the assessment and plan of the team.   Cristi Loron 12/02/2019, 3:56 PM

## 2019-12-02 NOTE — Progress Notes (Signed)
Patient d/c home with family member at 11:15, A&O x4 at the time of d/c. Denied pain or discomfort. Patient educated on wound dressing change. All questions and concerns are addressed. We continue to monitor.

## 2019-12-03 ENCOUNTER — Telehealth: Payer: Self-pay | Admitting: *Deleted

## 2019-12-03 DIAGNOSIS — E1122 Type 2 diabetes mellitus with diabetic chronic kidney disease: Secondary | ICD-10-CM | POA: Insufficient documentation

## 2019-12-03 NOTE — Telephone Encounter (Signed)
error 

## 2019-12-03 NOTE — Telephone Encounter (Signed)
Transition Care Management Unsuccessful Follow-up Telephone Call  Date of discharge and from where:  12/02/19 From [facility]  Attempts:  1st Attempt  Reason for unsuccessful TCM follow-up call:  Left voice message

## 2019-12-03 NOTE — Progress Notes (Deleted)
Cardiology Clinic Note   Patient Name: Jon Anderson Date of Encounter: 12/03/2019  Primary Care Provider:  [name], MD Primary Cardiologist:  Jon Burow, MD  Patient Profile    Jon Anderson 69 year old male presents to the clinic today for follow-up evaluation of his chronic diastolic CHF, essential hypertension, and coronary artery disease.  Past Medical History    Past Medical History:  Diagnosis Date   Anemia of chronic disease    CAD (coronary artery disease)    a.  Myoview 4/11: EF 53%, no scar or ischemia   c. MV 2012 Nl perfusion, apical thinning.  No ischemia or scar.  EF 49%, appears greater by visual estimate.;  d.  Dob stress echo 12/13:  Negative Dob stress echo. There is no evidence of ischemia.  The LVF is normal. b. Normal cors 2016.   Carotid stenosis    a. <94% RICA, >17% LICA by duplex 07/812   Chronic chest pain    occ   Constipation    chronic   Dyspnea    with extertion   ESRD (end stage renal disease) on dialysis ([place])    M-W-F- Jon Anderson   GERD (gastroesophageal reflux disease)    History of kidney stones    Passed   HNP (herniated nucleus pulposus), lumbar    HTN (hypertension)    echo 3/10: EF 60%, LAE   Hyperlipidemia    Nephrolithiasis    "passed them all"   Peripheral arterial disease ([place])    a. s/p PTCA  right    Pneumonia yrs ago   Restless legs    Sciatic leg pain    Snores    a. presumed OSA, pt has refused sleep eval in past.   Type II diabetes mellitus ([place])    no longer on medications, checks blood glucose at home   Urinary frequency    Past Surgical History:  Procedure Laterality Date   ABDOMINAL AORTOGRAM W/LOWER EXTREMITY Bilateral 08/21/2019   Procedure: ABDOMINAL AORTOGRAM W/LOWER EXTREMITY;  Surgeon: Jon Sandy, MD;  Location: [place] [facility];  Service: Cardiovascular;  Laterality: Bilateral;   AMPUTATION Right 07/25/2019   Procedure: RIGHT 5th RAY AMPUTATION;   Surgeon: Jon Minion, MD;  Location: [place];  Service: Orthopedics;  Laterality: Right;   AMPUTATION Right 11/19/2019   Procedure: RIGHT TRANSMETATARSAL AMPUTATION;  Surgeon: Jon Minion, MD;  Location: [place];  Service: Orthopedics;  Laterality: Right;   ANGIOPLASTY / STENTING FEMORAL Left 12/11/2013   dr berry   AV FISTULA PLACEMENT Left 03/19/2014   Procedure: CREATION OF ARTERIOVENOUS (AV) FISTULA  LEFT UPPER ARM;  Surgeon: Jon Misty, MD;  Location: [place];  Service: Vascular;  Laterality: Left;   BACK SURGERY  01/2018   screws placed    CARDIAC CATHETERIZATION  2001 and 2010    COLONOSCOPY W/ BIOPSIES AND POLYPECTOMY     COLONOSCOPY WITH PROPOFOL N/A 08/01/2016   Procedure: COLONOSCOPY WITH PROPOFOL;  Surgeon: Jon Ada, MD;  Location: [facility];  Service: Endoscopy;  Laterality: N/A;   CORONARY ARTERY BYPASS GRAFT  2019   [facility] x [facility] bypass   ESOPHAGOGASTRODUODENOSCOPY (EGD) WITH PROPOFOL N/A 08/01/2016   Procedure: ESOPHAGOGASTRODUODENOSCOPY (EGD) WITH PROPOFOL;  Surgeon: Jon Ada, MD;  [facility]: [facility];  Service: Endoscopy;  Laterality: N/A;   FOOT FRACTURE SURGERY Right    ligament repair   FRACTURE SURGERY     left forearm   GRAFT APPLICATION Right 07/17/1854   Procedure: FAT  GRAFT APPLICATION;  Surgeon: Jon Anderson, DPM;  Location[facility];  Service: Podiatry;  Laterality: Right;   INGUINAL HERNIA REPAIR Left    LEFT HEART CATH AND CORS/GRAFTS ANGIOGRAPHY N/A 04/27/2017   Procedure: LEFT HEART CATH AND CORS/GRAFTS ANGIOGRAPHY;  Surgeon: Jon Man, MD;  Location: [place] [facility];  Service: Cardiovascular;  Laterality: N/A;   LEFT HEART CATHETERIZATION WITH CORONARY ANGIOGRAM N/A 06/22/2014   Procedure: LEFT HEART CATHETERIZATION WITH CORONARY ANGIOGRAM;  Surgeon: Jon Sine, MD;  Location: [facility];  Service: Cardiovascular;  Laterality: N/A;   LOWER EXTREMITY ANGIOGRAM Left 12/11/2013   Procedure: LOWER  EXTREMITY ANGIOGRAM;  Surgeon: Jon Harp, MD;  Location: [facility] C[facility];  Service: Cardiovascular;  Laterality: Left;   LUMBAR LAMINECTOMY/DECOMPRESSION MICRODISCECTOMY Right 07/03/2017   Procedure: MICRODISCECTOMY LUMBAR FIVE - SACRAL ONE RIGHT;  Surgeon: Jon Lose, MD;  Location: [place];  Service: Neurosurgery;  Laterality: Right;   LUMBAR LAMINECTOMY/DECOMPRESSION MICRODISCECTOMY Right 10/19/2017   Procedure: MICRODISCECTOMY LUMBAR FIVE- SACRAL 1 ONE ;  Surgeon: Jon Lose, MD;  Location: [place];  Service: Neurosurgery;  Laterality: Right;   TONSILLECTOMY AND ADENOIDECTOMY     WISDOM TOOTH EXTRACTION     WOUND DEBRIDEMENT Right 05/13/2019   Procedure: DEBRIDEMENT WOUND;  Surgeon: Jon Anderson, DPM;  Location: [place];  Service: Podiatry;  Laterality: Right;    Allergies  Allergies  Allergen Reactions   Kiwi Extract Itching, Swelling and Other (See Comments)    Lips and face swell- breathing not affected   Flexeril [Cyclobenzaprine]     Hands become flimsy, can not hold things   Tape Other (See Comments)    "Plastic" tape causes blisters!!    History of Present Illness    [name] has a past medical history of chronic diastolic CHF, essential hypertension, coronary artery disease (nonobstructive CAD per [facility] 2012), CABG 10/18 [facility], hyperlipidemia, carotid artery disease, PVD, type 2 diabetes, GERD, end-stage renal disease, and right transmetatarsal amputation.  He was seen by Dr. Gwenlyn Found on 2/16 and had started dialysis at that time. It was noted that he underwent off-pump LIMA insertion at [facility] October 2018 due to high-grade lesion that was discovered during work-up for renal transplant. Cardiac catheterization 1/19 showed patent LIMA to LAD with otherwise noncritical disease and normal LV function. Carotid Dopplers performed at [facility] imaging showed moderately severe left ICA  stenosis when compared with Dopplers performed in our office 10/15/2013, they did not appear significantly different. Due to the severity of the lesion in the 70-90% range CT of his neck showed no significant obstructive disease in his carotid circulation.  His last seen by Dr. Gwenlyn Found on 06/11/2019. During that time he was hypertensive with a blood pressure of 148/60 and not on any antihypertensive medication at that time. His ABIs showed noncompressible ABIs in the mid right popliteal with no evidence of left SFA. He was seen podiatry for treatment of calluses and and undergone implementation of collagen which limited his ambulation.  He presented to the emergency department on 10/15/2019 with complaints of chest discomfort. Stated his pain had been present for 2 days. He did not go to dialysis 2 days ago when the pain began. However, he did present to dialysis on the day he presented to the emergency department. He denied shortness of breath, nausea, vomiting, diarrhea, cough, and fever. EKG showed sinus rhythm 91 bpm with no ischemic changes. Blood pressure  110/86 with a pulse of 94. Chest wall tenderness over right pectoral region. Troponins flat. CMP, CBC, BMP unremarkable lipase unremarkable. GI cocktail showed improvement in symptoms. He was felt to have chest wall pain and discharged in stable condition.  He was admitted to hospital 11/25/2019 and discharged 12/02/2019. He presented on 11/19/2019 with an ulceration of his fourth metatarsal and tip of his right great toe. There was no change with conservative management he underwent right metatarsal amputation on 11/19/2019 by Dr. Sharol Given. A wound VAC was applied. His HD was continued. He remained on aspirin and Plavix prior to the admission. He presented today rehab on 11/25/2019. He completed his wound VAC treatment on 11/26/2019 and was touchdown weightbearing with his right lower extremity. His pain control was scheduled with Neurontin and oxycodone as needed. His  CBC was stable with his HD. He was able to ambulate 50 feet with supervision and a walking boot and maintain weightbearing precautions. He was discharged home on 12/02/2019 and able to complete all of his ADLs at that time.  He presents the clinic today for follow-up evaluation and states***  *** denies chest pain, shortness of breath, lower extremity edema, fatigue, palpitations, melena, hematuria, hemoptysis, diaphoresis, weakness, presyncope, syncope, orthopnea, and PND.  Home Medications    Prior to Admission medications   Medication Sig Start Date End Date Taking? Authorizing Provider  acetaminophen (TYLENOL) 325 MG tablet Take 1-2 tablets (325-650 mg total) by mouth every 6 (six) hours as needed for mild pain (pain score 1-3 or temp > 100.5). 12/01/19   Angiulli, Lavon Paganini, PA-C  aspirin EC 81 MG tablet Take 1 tablet (81 mg total) by mouth daily. 05/28/12   Richardson Dopp T, PA-C  atorvastatin (LIPITOR) 40 MG tablet Take 1 tablet (40 mg total) by mouth daily at 6 PM. 12/01/19   Angiulli, Lavon Paganini, PA-C  calcitRIOL (ROCALTROL) 0.25 MCG capsule Take 5 capsules (1.25 mcg total) by mouth every Monday, Wednesday, and Friday with hemodialysis. 12/01/19   Angiulli, Lavon Paganini, PA-C  calcium acetate (PHOSLO) 667 MG capsule Take 2 capsules (1,334 mg total) by mouth 2 (two) times daily with a meal. 12/01/19   Angiulli, Lavon Paganini, PA-C  clopidogrel (PLAVIX) 75 MG tablet Take 1 tablet (75 mg total) by mouth daily. 12/01/19   Angiulli, Lavon Paganini, PA-C  gabapentin (NEURONTIN) 100 MG capsule Take 1 capsule (100 mg total) by mouth 2 (two) times daily. 12/01/19   Angiulli, Lavon Paganini, PA-C  lipase/protease/amylase (CREON) 36000 UNITS CPEP capsule Take 1 capsule (36,000 Units total) by mouth daily. 12/01/19   Angiulli, Lavon Paganini, PA-C  loperamide (IMODIUM) 2 MG capsule Take 2 mg by mouth as needed for diarrhea or loose stools.    [provider]  midodrine (PROAMATINE) 5 MG tablet Take 1 tablet (5 mg total) by mouth  every Monday, Wednesday, and Friday. Take with Dialysis 12/01/19   Cathlyn Parsons, PA-C  multivitamin (RENA-VIT) TABS tablet Take 1 tablet by mouth at bedtime. 12/01/19   Angiulli, Lavon Paganini, PA-C  Naldemedine Tosylate (SYMPROIC PO) Take 1 tablet by mouth daily as needed.    [provider]  nitroGLYCERIN (NITROSTAT) 0.4 MG SL tablet Place 1 tablet (0.4 mg total) under the tongue every 5 (five) minutes as needed for chest pain. 03/20/12   Barrett, Evelene Croon, PA-C  oxyCODONE (OXY IR/ROXICODONE) 5 MG immediate release tablet Take 1-2 tablets (5-10 mg total) by mouth every 4 (four) hours as needed for moderate pain (pain score 4-6). 12/01/19  Angiulli, Lavon Paganini, PA-C  pantoprazole (PROTONIX) 40 MG tablet Take 1 tablet (40 mg total) by mouth daily. 12/01/19   Angiulli, Lavon Paganini, PA-C  pentoxifylline (TRENTAL) 400 MG CR tablet Take 1 tablet (400 mg total) by mouth daily. 12/01/19   Angiulli, Lavon Paganini, PA-C  polyethylene glycol (MIRALAX / GLYCOLAX) packet Take 17 g by mouth daily as needed for moderate constipation. 03/22/17   Aline August, MD    Family History    Family History  Problem Relation Age of Onset   Heart attack Sister        died @ 75   Cancer Mother        died @ 19; unknown type   Diabetes Brother        deceased   Cirrhosis Father        alcohol related   Diabetes Father    Esophageal cancer Neg Hx    Colon cancer Neg Hx    Pancreatic cancer Neg Hx    Stomach cancer Neg Hx    He indicated that the status of his mother is unknown. He indicated that the status of his father is unknown. He indicated that the status of his sister is unknown. He indicated that the status of his brother is unknown. He indicated that the status of his neg hx is unknown.  Social History    Social History   Socioeconomic History   Marital status: Married    Spouse name: Not on file   Number of children: Not on file   Years of education: Not on file   Highest education  level: Not on file  Occupational History   Occupation: retired  Tobacco Use   Smoking status: Former Smoker    Packs/day: 1.00    Years: 2.00    Pack years: 2.00    Types: Cigarettes    Quit date: 1976    Years since quitting: 45.6   Smokeless tobacco: Never Used   Tobacco comment: quit smoking 40 yrs ago  Electronics engineer Use   Vaping Use: Never used  Substance and Sexual Activity   Alcohol use: No    Alcohol/week: 0.0 standard drinks    Comment: h/o social drinking   Drug use: Not Currently    Types: Marijuana   Sexual activity: Yes    Birth control/protection: None  Other Topics Concern   Not on file  Social History Narrative   The patient is a Retail buyer.  He is married and has 2 grown children.  Lives in [place] with his wife.  Denies tobacco, alcohol or IV drug abuse or marijuana or cocaine intake.    Social Determinants of Health   Financial Resource Strain:    Difficulty of Paying Living Expenses: Not on file  Food Insecurity:    Worried About Charity fundraiser in the Last Year: Not on file   [employer] of Food in the Last Year: Not on file  Transportation Needs:    Lack of Transportation (Medical): Not on file   Lack of Transportation (Non-Medical): Not on file  Physical Activity:    Days of Exercise per Week: Not on file   Minutes of Exercise per Session: Not on file  Stress:    Feeling of Stress : Not on file  Social Connections:    Frequency of Communication with Friends and Family: Not on file   Frequency of Social Gatherings with Friends and Family: Not on file   Attends Religious Services: Not on file  Active Member of Clubs or Organizations: Not on file   Attends Archivist Meetings: Not on file   Marital Status: Not on file  Intimate Partner Violence:    Fear of Current or Ex-Partner: Not on file   Emotionally Abused: Not on file   Physically Abused: Not on file   Sexually Abused: Not on file     Review of Systems      General:  No chills, fever, night sweats or weight changes.  Cardiovascular:  No chest pain, dyspnea on exertion, edema, orthopnea, palpitations, paroxysmal nocturnal dyspnea. Dermatological: No rash, lesions/masses Respiratory: No cough, dyspnea Urologic: No hematuria, dysuria Abdominal:   No nausea, vomiting, diarrhea, bright red blood per rectum, melena, or hematemesis Neurologic:  No visual changes, wkns, changes in mental status. All other systems reviewed and are otherwise negative except as noted above.  Physical Exam    VS:  There were no vitals taken for this visit. , BMI There is no height or weight on file to calculate BMI. GEN: Well nourished, well developed, in no acute distress. HEENT: normal. Neck: Supple, no JVD, carotid bruits, or masses. Cardiac: RRR, no murmurs, rubs, or gallops. No clubbing, cyanosis, edema.  Radials/DP/PT 2+ and equal bilaterally.  Respiratory:  Respirations regular and unlabored, clear to auscultation bilaterally. GI: Soft, nontender, nondistended, BS + x 4. MS: no deformity or atrophy. Skin: warm and dry, no rash. Neuro:  Strength and sensation are intact. Psych: Normal affect.  Accessory Clinical Findings    Recent Labs: 04/26/2019: Magnesium 1.8 10/15/2019: ALT 19; B Natriuretic Peptide 186.1 12/01/2019: BUN 56; Creatinine, Ser 11.48; Hemoglobin 10.7; Platelets 329; Potassium 5.2; Sodium 134   Recent Lipid Panel    Component Value Date/Time   CHOL 130 07/18/2018 1414   TRIG 74 07/18/2018 1414   HDL 47 07/18/2018 1414   CHOLHDL 2.8 07/18/2018 1414   VLDL 15 07/18/2018 1414   LDLCALC 68 07/18/2018 1414    ECG personally reviewed by me today- *** - No acute changes  EKG 10/16/2019 Sinus rhythm abnormal T wave in inferior leads 91 bpm  Echocardiogram 02/23/2018 Study Conclusions   - Left ventricle: The cavity size was normal. There was severe  concentric hypertrophy. Systolic function was normal. The  estimated ejection fraction  was in the range of 60% to 65%. Wall  motion was normal; there were no regional wall motion  abnormalities. There was an increased relative contribution of  atrial contraction to ventricular filling. Doppler parameters are  consistent with abnormal left ventricular relaxation (grade 1  diastolic dysfunction). Doppler parameters are consistent with  high ventricular filling pressure.  - Aortic valve: Trileaflet; mildly thickened, mildly calcified  leaflets. Valve area (VTI): 1.99 cm^2. Valve area (Vmax): 2.12  cm^2. Valve area (Vmean): 2.02 cm^2.  - Mitral valve: Severely calcified annulus. Moderate diffuse  thickening and calcification of the anterior leaflet and  posterior leaflet. Mobility of the posterior leaflet was  restricted to the point of immobility. The findings are  consistent with moderate stenosis. There was mild regurgitation.  Mean gradient (D): 7 mm Hg. Valve area by continuity equation  (using LVOT flow): 1.11 cm^2.  - Left atrium: The atrium was mildly dilated.  - Pulmonary arteries: Systolic pressure could not be accurately  estimated as IVC is not visualized.   Impressions:   - The right ventricular systolic pressure was increased consistent  with mild pulmonary hypertension.  Assessment & Plan   1. Coronary artery disease-no chest pain today. Underwent  CABG x1 LIMA-LAD [facility] 10/18. Cardiac catheterization 1/19 showed patent LIMA to LAD with otherwise noncritical disease and normal LVEF. Continue atorvastatin, aspirin, Plavix, nitroglycerin Heart healthy low-sodium diet-salty 6 given Increase physical activity as tolerated  Essential hypertension-BP today***. Well-controlled at home. On HD and using midodrine Monday Wednesday Friday Heart healthy low-sodium diet-salty 6 given Increase physical activity as tolerated  Hyperlipidemia-. LDL 68 on 07/18/2018. Continue atorvastatin Heart healthy low-sodium diet-salty 6 given Increase  physical activity as tolerated Repeat fasting lipid liver  Carotid artery disease-carotid Dopplers 06/26/2017 showed less than 50% stenosis right ICA, greater than 70% stenosis left ICA. Carotid Dopplers 06/18/2018 showed moderate left ICA. Continue atorvastatin, aspirin, Plavix Repeat bilateral carotid Dopplers  Peripheral vascular disease-denies claudication today. Lower extremity arterial Dopplers 04/16/2019 showed noncompressible ABIs with right popliteal disease and no evidence of left SFA. He underwent right metatarsal amputation on 11/19/2019 by Dr. Sharol Given. He completed rehab on 12/02/2019 with ability to weight-bear and complete his ADLs. Continue atorvastatin, aspirin, Plavix Heart healthy low-sodium carb modified renal diet Increase physical activity as tolerated    Disposition: Follow-up with Dr. Gwenlyn Found in 3 months.   Jossie Ng. Berdia Lachman NP-C    12/03/2019, 8:54 AM [place] [address] Office [phone] Fax [phone]  Notice: This dictation was prepared with Dragon dictation along with smaller phrase technology. Any transcriptional errors that result from this process are unintentional and may not be corrected upon review.

## 2019-12-03 NOTE — Progress Notes (Signed)
Inpatient Rehabilitation Care Coordinator  Discharge Note  The overall goal for the admission was met for:   Discharge location: Yes. D/c to home.   Length of Stay: Yes. 6 days.   Discharge activity level: Yes. Mod I.  Home/community participation: Yes. Limited.  Services provided included: MD, RD, PT, OT, RN, CM, TR, Pharmacy, Neuropsych and SW  Financial Services: Private Insurance: [facility] Medicare  Follow-up services arranged: Home Health: [facility] for HHPT/OT/RN/SNA and DME: [employer] [facility] for w/c, TTB  Comments (or additional information): contact pt wife Stanton Kidney [phone]  Patient/Family verbalized understanding of follow-up arrangements: Yes  Individual responsible for coordination of the follow-up plan: Pt to have assistance with care needs.   Confirmed correct DME delivered: Rana Snare 12/03/2019    Rana Snare

## 2019-12-04 NOTE — Telephone Encounter (Signed)
Transitional Care call--I SPOKE WITH MRS. Adkison    1. Are you/is patient experiencing any problems since coming home? Are there any questions regarding any aspect of care? NO 2. Are there any questions regarding medications administration/dosing? Are meds being taken as prescribed? Patient should review meds with caller to confirm HE HAS MEDICATION, REMINDED TO BRING PAIN MEDICATION BOTTLE TO APPT. 3. Have there been any falls? NO 4. Has Home Health been to the house and/or have they contacted you? If not, have you tried to contact them? Can we help you contact them? YES 5. Are bowels and bladder emptying properly? Are there any unexpected incontinence issues? If applicable, is patient following bowel/bladder programs? NO PROBLEMS CURRENTLY. ENCOURAGED TO TAKE THE [place] TO AVOID OPIOD CONSTIPATION. IS HAVING A LOT OF PAIN WITH FOOT. ENCOURAGED TO KEEP ELEVATED. 6. Any fevers, problems with breathing, unexpected pain? NO SEE ABOVE FOR PAIN 7. Are there any skin problems or new areas of breakdown? NO 8. Has the patient/family member arranged specialty MD follow up (ie cardiology/neurology/renal/surgical/etc)?  Can we help arrange? 9. Does the patient need any other services or support that we can help arrange? THEY ARE CURRENTLY TRYING TO REARRANGE APPOINTMENTS SCHEDULED FOR TOMORROW WITH DR [place] DUE TO PATIENT IS MON- WED- Friday DIALYSIS PATIENT AND ALSO HAS TO TAKE SCAT BUS. 10. Are caregivers following through as expected in assisting the patient? YES 11. Has the patient quit smoking, drinking alcohol, or using drugs as recommended? YES  Appointment CHANGED TO 12/18/19 Thursday DUE TO CONFLICT WITH DIALYSIS M-W-F. THIS WILL BE A HOSPITAL FOLLOW UP SINCE PAST THE 14 DAYS.  APPT 10:40 0 ARRIVE BY 10:20  TO SEE DR Ranell Patrick. ALERTED TO WATCH FOR PACKET IN THE MAIL AND THE APPT CHANGE. [address] [phone]

## 2019-12-05 ENCOUNTER — Ambulatory Visit: Payer: Medicare Other | Admitting: General Practice

## 2019-12-05 ENCOUNTER — Encounter: Payer: Self-pay | Admitting: Physician Assistant

## 2019-12-05 ENCOUNTER — Other Ambulatory Visit: Payer: Self-pay

## 2019-12-05 ENCOUNTER — Ambulatory Visit (INDEPENDENT_AMBULATORY_CARE_PROVIDER_SITE_OTHER): Payer: Medicare Other | Admitting: Physician Assistant

## 2019-12-05 DIAGNOSIS — M86271 Subacute osteomyelitis, right ankle and foot: Secondary | ICD-10-CM

## 2019-12-05 MED ORDER — DOXYCYCLINE HYCLATE 100 MG PO TABS: 100.0000 mg | ORAL_TABLET | Freq: Two times a day (BID) | ORAL | 0 refills | Status: DC

## 2019-12-05 MED ORDER — NITROGLYCERIN 0.2 MG/HR TD PT24: 0.2000 mg | MEDICATED_PATCH | Freq: Every day | TRANSDERMAL | 12 refills | Status: DC

## 2019-12-05 MED ORDER — PENTOXIFYLLINE ER 400 MG PO TBCR: 400.0000 mg | EXTENDED_RELEASE_TABLET | Freq: Every day | ORAL | 3 refills | Status: DC

## 2019-12-05 NOTE — Progress Notes (Signed)
Office Visit Note   Patient: Jon Anderson           Date of Birth: April 10, 1951           MRN: [id number] Visit Date: 12/05/2019              Requested by: [name], MD [address] [place],  [place] [address] PCP: [name], MD  No chief complaint on file.     HPI: The patient is a pleasant 69 year old gentleman who is now 16 days status post right transmetatarsal amputation.  He was released from the hospital rehab this Tuesday.  He has gotten a small blister on the back of his heel from wearing his shoe.  He has had significant pain in the dorsum of his foot since surgery.  He thinks this is about the same.  He denies any fever or chills.  His daughter has been doing daily dressing changes  Assessment & Plan: Visit Diagnoses: No diagnosis found.  Plan: He is not to wear the shoe when he is at rest.  I replaced the donut that he has over the area of the blister.  He is to elevate his foot so that his foot hangs off the end of the pillow without any pressure to his heel.  I have called in doxycycline refill of his tramadol and nitroglycerin patches.  He has used the patches in the past and understands how to use them.  I do have considerate concerns about his microcirculation.  He will follow-up on Monday or Tuesday with Dr. Sharol Given  Follow-Up Instructions: No follow-ups on file.   Ortho Exam  Patient is alert, oriented, no adenopathy, well-dressed, normal affect, normal respiratory effort. Right foot: Moderate amount of soft tissue swelling but no cellulitis.  He is extremely sensitive even to light touch over the top of his foot.  Does not tolerate wound being probed.  Wound demonstrates some necrotic areas but is still well opposed.  Minimal bloody drainage.  On the posterior aspect of his heel he has a small blister.  There is no fluctuance or cellulitis surrounding it.  Palpating the heel is not tender.  He has a biphasic dorsalis pedis and posterior tibial pulses that  were audible by Doppler.  Imaging: No results found. No images are attached to the encounter.  Labs: Lab Results  Component Value Date   HGBA1C 6.4 (H) 07/24/2019   HGBA1C 6.5 (H) 07/18/2018   HGBA1C 6.4 (H) 10/16/2017   ESRSEDRATE 22 (H) 07/24/2019   CRP <0.5 07/24/2019   REPTSTATUS 07/29/2019 FINAL 07/24/2019   CULT  07/24/2019    NO GROWTH 5 DAYS Performed at [facility], [place] [address] [place], [place] [address]      Lab Results  Component Value Date   ALBUMIN 3.1 (L) 12/01/2019   ALBUMIN 3.1 (L) 11/28/2019   ALBUMIN 3.1 (L) 11/26/2019   PREALBUMIN 28.0 07/24/2019    Lab Results  Component Value Date   MG 1.8 04/26/2019   MG 1.8 04/24/2019   MG 2.1 03/22/2017   No results found for: VD25OH  Lab Results  Component Value Date   PREALBUMIN 28.0 07/24/2019   CBC EXTENDED Latest Ref Rng & Units 12/01/2019 11/28/2019 11/26/2019  WBC 4.0 - 10.5 K/uL 8.3 9.2 9.1  RBC 4.22 - 5.81 MIL/uL 3.65(L) 3.74(L) 3.92(L)  HGB 13.0 - 17.0 g/dL 10.7(L) 11.2(L) 12.0(L)  HCT 39 - 52 % 33.9(L) 35.3(L) 36.7(L)  PLT 150 - 400 K/uL 329  254 189  NEUTROABS 1.7 - 7.7 K/uL - - -  LYMPHSABS 0.7 - 4.0 K/uL - - -     There is no height or weight on file to calculate BMI.  Orders:  No orders of the defined types were placed in this encounter.  Meds ordered this encounter  Medications  . nitroGLYCERIN (NITRODUR - DOSED IN MG/24 HR) 0.2 mg/hr patch    Sig: Place 1 patch (0.2 mg total) onto the skin daily. Place on different areas of right forefoot daily    Dispense:  30 patch    Refill:  12  . doxycycline (VIBRA-TABS) 100 MG tablet    Sig: Take 1 tablet (100 mg total) by mouth 2 (two) times daily.    Dispense:  60 tablet    Refill:  0  . pentoxifylline (TRENTAL) 400 MG CR tablet    Sig: Take 1 tablet (400 mg total) by mouth daily.    Dispense:  90 tablet    Refill:  3     Procedures: No procedures performed  Clinical Data: No additional findings.  ROS:  All other  systems negative, except as noted in the HPI. Review of Systems  Objective: Vital Signs: There were no vitals taken for this visit.  Specialty Comments:  No specialty comments available.  PMFS History: Patient Active Problem List   Diagnosis Date Noted  . Right below-knee amputee ([place]) 11/25/2019  . S/P transmetatarsal amputation of foot, right ([place]) 11/25/2019  . Gangrene of right foot ([place]) 11/19/2019  . History of partial ray amputation of fifth toe of right foot ([place]) 09/09/2019  . Ischemic ulcer diabetic foot ([place]) 09/09/2019  . Subacute osteomyelitis, right ankle and foot ([place])   . Diabetic foot infection ([place]) 07/24/2019  . Chronic diastolic CHF (congestive heart failure) ([place]) 07/24/2019  . Diabetic ulcer of right midfoot associated with diabetes mellitus due to underlying condition, with fat layer exposed ([place])   . Gastroenteritis 04/24/2019  . Allergy, unspecified, initial encounter 01/27/2019  . DM neuropathy with neurologic complication ([place]) 85/46/2703  . GERD (gastroesophageal reflux disease) 02/28/2018  . Nephrolithiasis 02/28/2018  . Sciatic leg pain 02/28/2018  . Snores 02/28/2018  . Orthopnea 02/23/2018  . Elevated troponin 02/23/2018  . HNP (herniated nucleus pulposus), lumbar 07/03/2017  . Encounter for removal of sutures 06/01/2017  . Chest pain in adult 04/27/2017  . Restless leg syndrome, uncontrolled 04/27/2017  . 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 Anemia of chronic disease 03/17/2017  . ESRD (end stage renal disease) on dialysis ([place]) 03/17/2017  . Chronic chest pain 03/17/2017  . Type II diabetes mellitus ([place]) 03/17/2017  . Acute on chronic diastolic heart failure ([place]) 03/17/2017  . Acute heart failure ([place]) 03/17/2017  . Lumbar radiculopathy 01/16/2017  . Pre-transplant evaluation for kidney transplant 06/15/2016  . Pain, unspecified 04/17/2016  . Increased frequency of urination 12/17/2015  . Nocturia 12/17/2015  . Chest pain,  non-cardiac 10/09/2015  . Hypercalcemia 05/10/2015  . Other fluid overload 02/24/2015  . 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 Fatty (change of) liver, not elsewhere classified 07/02/2014  . Aftercare including intermittent dialysis ([place]) 06/24/2014  . Diarrhea, unspecified 06/24/2014  . Fever, unspecified 06/24/2014  . Iron deficiency anemia, unspecified 06/24/2014  . Other specified coagulation defects ([place]) 06/24/2014  . Pruritus, unspecified 06/24/2014  . Secondary hyperparathyroidism of renal origin ([place]) 06/24/2014  . Type 2 diabetes mellitus with diabetic peripheral angiopathy without gangrene ([place]) 06/24/2014  .  Acute on chronic renal failure ([place]) 06/16/2014  . Shoulder pain, left 12/15/2013  . Chest pain 12/15/2013  . Claudication ([place]) 12/11/2013  . PVD (peripheral vascular disease) ([place]) 12/11/2013  . Carotid artery disease ([place]) 09/30/2013  . Acute chest pain 11/15/2012  . Bruit 09/15/2010  . CAD (coronary artery disease) nonobstructive per cath 2012   . Hyperlipidemia LDL goal <70 07/22/2009  . Essential hypertension 07/22/2009   Past Medical History:  Diagnosis Date  . Anemia of chronic disease   . CAD (coronary artery disease)    a.  Myoview 4/11: EF 53%, no scar or ischemia   c. MV 2012 Nl perfusion, apical thinning.  No ischemia or scar.  EF 49%, appears greater by visual estimate.;  d.  Dob stress echo 12/13:  Negative Dob stress echo. There is no evidence of ischemia.  The LVF is normal. b. Normal cors 2016.  . Carotid stenosis    a. <51% RICA, >02% LICA by duplex 08/8525  . Chronic chest pain    occ  . Constipation    chronic  . Dyspnea    with extertion  . ESRD (end stage renal disease) on dialysis [facility])    M-W-F- [name]  . GERD (gastroesophageal reflux disease)   . History of kidney stones    Passed  . HNP (herniated nucleus pulposus), lumbar   . HTN (hypertension)    echo 3/10: EF 60%, LAE  .  Hyperlipidemia   . Nephrolithiasis    "passed them all"  . Peripheral arterial disease ([place])    a. s/p PTCA  right   . Pneumonia yrs ago  . Restless legs   . Sciatic leg pain   . Snores    a. presumed OSA, pt has refused sleep eval in past.  . Type II diabetes mellitus ([place])    no longer on medications, checks blood glucose at home  . Urinary frequency     Family History  Problem Relation Age of Onset  . Heart attack Sister        died @ 61  . Cancer Mother        died @ 21; unknown type  . Diabetes Brother        deceased  . Cirrhosis Father        alcohol related  . Diabetes Father   . Esophageal cancer Neg Hx   . Colon cancer Neg Hx   . Pancreatic cancer Neg Hx   . Stomach cancer Neg Hx     Past Surgical History:  Procedure Laterality Date  . ABDOMINAL AORTOGRAM W/LOWER EXTREMITY Bilateral 08/21/2019   Procedure: ABDOMINAL AORTOGRAM W/LOWER EXTREMITY;  Surgeon: Waynetta Sandy, MD;  Location: [facility];  Service: Cardiovascular;  Laterality: Bilateral;  . AMPUTATION Right 07/25/2019   Procedure: RIGHT 5th RAY AMPUTATION;  Surgeon: Newt Minion, MD;  Location: [facility] [place];  Service: Orthopedics;  Laterality: Right;  . AMPUTATION Right 11/19/2019   Procedure: RIGHT TRANSMETATARSAL AMPUTATION;  Surgeon: Newt Minion, MD;  Location: [facility];  Service: Orthopedics;  Laterality: Right;  . ANGIOPLASTY / STENTING FEMORAL Left 12/11/2013   dr berry  . AV FISTULA PLACEMENT Left 03/19/2014   Procedure: CREATION OF ARTERIOVENOUS (AV) FISTULA  LEFT UPPER ARM;  Surgeon: Mal Misty, MD;  Location: [facility];  Service: Vascular;  Laterality: Left;  . BACK SURGERY  01/2018   screws placed   . CARDIAC CATHETERIZATION  2001 and 2010   . COLONOSCOPY W/  BIOPSIES AND POLYPECTOMY    . COLONOSCOPY WITH PROPOFOL N/A 08/01/2016   Procedure: COLONOSCOPY WITH PROPOFOL;  Surgeon: Carol Ada, MD;  [facility];  Service: Endoscopy;  Laterality: N/A;  . CORONARY ARTERY  BYPASS GRAFT  2019   [facility] x [facility] bypass  . ESOPHAGOGASTRODUODENOSCOPY (EGD) WITH PROPOFOL N/A 08/01/2016   Procedure: ESOPHAGOGASTRODUODENOSCOPY (EGD) WITH PROPOFOL;  Surgeon: Carol Ada, MD;  [facility];  Service: Endoscopy;  Laterality: N/A;  . FOOT FRACTURE SURGERY Right    ligament repair  . FRACTURE SURGERY     left forearm  . GRAFT APPLICATION Right 4/0/0867   Procedure: FAT GRAFT APPLICATION;  Surgeon: Evelina Bucy, DPM;  Location: [place] [facility];  Service: Podiatry;  Laterality: Right;  . INGUINAL HERNIA REPAIR Left   . LEFT HEART CATH AND CORS/GRAFTS ANGIOGRAPHY N/A 04/27/2017   Procedure: LEFT HEART CATH AND CORS/GRAFTS ANGIOGRAPHY;  Surgeon: Leonie Man, MD;  Location: [place] [facility];  Service: Cardiovascular;  Laterality: N/A;  . LEFT HEART CATHETERIZATION WITH CORONARY ANGIOGRAM N/A 06/22/2014   Procedure: LEFT HEART CATHETERIZATION WITH CORONARY ANGIOGRAM;  Surgeon: Troy Sine, MD;  Location: [facility];  Service: Cardiovascular;  Laterality: N/A;  . LOWER EXTREMITY ANGIOGRAM Left 12/11/2013   Procedure: LOWER EXTREMITY ANGIOGRAM;  Surgeon: Lorretta Harp, MD;  Location: [facility];  Service: Cardiovascular;  Laterality: Left;  . LUMBAR LAMINECTOMY/DECOMPRESSION MICRODISCECTOMY Right 07/03/2017   Procedure: MICRODISCECTOMY LUMBAR FIVE - SACRAL ONE RIGHT;  Surgeon: Consuella Lose, MD;  Location: [place];  Service: Neurosurgery;  Laterality: Right;  . LUMBAR LAMINECTOMY/DECOMPRESSION MICRODISCECTOMY Right 10/19/2017   Procedure: MICRODISCECTOMY LUMBAR FIVE- SACRAL 1 ONE ;  Surgeon: Consuella Lose, MD;  Location: [facility];  Service: Neurosurgery;  Laterality: Right;  . TONSILLECTOMY AND ADENOIDECTOMY    . WISDOM TOOTH EXTRACTION    . WOUND DEBRIDEMENT Right 05/13/2019   Procedure: DEBRIDEMENT WOUND;  Surgeon: Evelina Bucy, DPM;  Location[facility];  Service: Podiatry;  Laterality: Right;   Social History    Occupational History  . Occupation: retired  Tobacco Use  . Smoking status: Former Smoker    Packs/day: 1.00    Years: 2.00    Pack years: 2.00    Types: Cigarettes    Quit date: 1976    Years since quitting: 45.6  . Smokeless tobacco: Never Used  . Tobacco comment: quit smoking 40 yrs ago  Vaping Use  . Vaping Use: Never used  Substance and Sexual Activity  . Alcohol use: No    Alcohol/week: 0.0 standard drinks    Comment: h/o social drinking  . Drug use: Not Currently    Types: Marijuana  . Sexual activity: Yes    Birth control/protection: None

## 2019-12-08 ENCOUNTER — Ambulatory Visit (INDEPENDENT_AMBULATORY_CARE_PROVIDER_SITE_OTHER): Payer: Medicare Other | Admitting: Orthopedic Surgery

## 2019-12-08 ENCOUNTER — Encounter: Payer: Self-pay | Admitting: Physician Assistant

## 2019-12-08 VITALS — Ht 71.0 in | Wt 165.0 lb

## 2019-12-08 DIAGNOSIS — T8781 Dehiscence of amputation stump: Secondary | ICD-10-CM

## 2019-12-08 DIAGNOSIS — I739 Peripheral vascular disease, unspecified: Secondary | ICD-10-CM

## 2019-12-08 DIAGNOSIS — Z89431 Acquired absence of right foot: Secondary | ICD-10-CM

## 2019-12-08 NOTE — Progress Notes (Signed)
Office Visit Note   Patient: Jon Anderson           Date of Birth: 06/28/1950           MRN: [id number] Visit Date: 12/08/2019              Requested by: [name], MD [address] [place],  [place] [address] PCP: [name], MD  Chief Complaint  Patient presents with  . Right Foot - Routine Post Op    11/19/19 right transmet amputation       HPI: Patient is a 69 year old gentleman who is 3 weeks status post right transmetatarsal amputation.  Patient complains of increasing ischemic pain with ischemic blisters dorsally over the ankle over the heel and wound breakdown of the transmetatarsal amputation  Assessment & Plan: Visit Diagnoses:  1. PVD (peripheral vascular disease) ([place])   2. Dehiscence of amputation stump ([place])   3. History of transmetatarsal amputation of right foot ([place])     Plan: Will continue with the nitroglycerin Trental Plavix minimize weightbearing and daily dressing changes.  Discussed that this is a microcirculatory problem that there is good circulation to the ankle and further surgical intervention may be warranted.  Follow-Up Instructions: Return in about 1 week (around 12/15/2019).   Ortho Exam  Patient is alert, oriented, no adenopathy, well-dressed, normal affect, normal respiratory effort. Examination patient has a superficial ischemic ulcer dorsally over the ankle.  There is an ischemic ulcer over the heel which is also superficial and there is mild ischemic gangrenous changes to the surgical incision.  Patient has a biphasic dopplerable dorsalis pedis pulse with good microcirculation to the ankle.  Imaging: No results found. No images are attached to the encounter.  Labs: Lab Results  Component Value Date   HGBA1C 6.4 (H) 07/24/2019   HGBA1C 6.5 (H) 07/18/2018   HGBA1C 6.4 (H) 10/16/2017   ESRSEDRATE 22 (H) 07/24/2019   CRP <0.5 07/24/2019   REPTSTATUS 07/29/2019 FINAL 07/24/2019   CULT  07/24/2019    NO GROWTH 5  DAYS Performed at [facility], [place] [address] [place], [place] [address]      Lab Results  Component Value Date   ALBUMIN 3.1 (L) 12/01/2019   ALBUMIN 3.1 (L) 11/28/2019   ALBUMIN 3.1 (L) 11/26/2019   PREALBUMIN 28.0 07/24/2019    Lab Results  Component Value Date   MG 1.8 04/26/2019   MG 1.8 04/24/2019   MG 2.1 03/22/2017   No results found for: VD25OH  Lab Results  Component Value Date   PREALBUMIN 28.0 07/24/2019   CBC EXTENDED Latest Ref Rng & Units 12/01/2019 11/28/2019 11/26/2019  WBC 4.0 - 10.5 K/uL 8.3 9.2 9.1  RBC 4.22 - 5.81 MIL/uL 3.65(L) 3.74(L) 3.92(L)  HGB 13.0 - 17.0 g/dL 10.7(L) 11.2(L) 12.0(L)  HCT 39 - 52 % 33.9(L) 35.3(L) 36.7(L)  PLT 150 - 400 K/uL 329 254 189  NEUTROABS 1.7 - 7.7 K/uL - - -  LYMPHSABS 0.7 - 4.0 K/uL - - -     Body mass index is 23.01 kg/m.  Orders:  No orders of the defined types were placed in this encounter.  No orders of the defined types were placed in this encounter.    Procedures: No procedures performed  Clinical Data: No additional findings.  ROS:  All other systems negative, except as noted in the HPI. Review of Systems  Objective: Vital Signs: Ht 5\' 11"  (1.803 m)   Wt 165 lb (74.8 kg)  BMI 23.01 kg/m   Specialty Comments:  No specialty comments available.  PMFS History: Patient Active Problem List   Diagnosis Date Noted  . Right below-knee amputee ([place]) 11/25/2019  . S/P transmetatarsal amputation of foot, right ([place]) 11/25/2019  . Gangrene of right foot ([place]) 11/19/2019  . History of partial ray amputation of fifth toe of right foot ([place]) 09/09/2019  . Ischemic ulcer diabetic foot ([place]) 09/09/2019  . Subacute osteomyelitis, right ankle and foot ([place])   . Diabetic foot infection ([place]) 07/24/2019  . Chronic diastolic CHF (congestive heart failure) ([place]) 07/24/2019  . Diabetic ulcer of right midfoot associated with diabetes mellitus due to underlying condition, with fat layer exposed ([place])    . Gastroenteritis 04/24/2019  . Allergy, unspecified, initial encounter 01/27/2019  . DM neuropathy with neurologic complication ([place]) 60/01/9322  . GERD (gastroesophageal reflux disease) 02/28/2018  . Nephrolithiasis 02/28/2018  . Sciatic leg pain 02/28/2018  . Snores 02/28/2018  . Orthopnea 02/23/2018  . Elevated troponin 02/23/2018  . HNP (herniated nucleus pulposus), lumbar 07/03/2017  . Encounter for removal of sutures 06/01/2017  . Chest pain in adult 04/27/2017  . Restless leg syndrome, uncontrolled 04/27/2017  . 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 Anemia of chronic disease 03/17/2017  . ESRD (end stage renal disease) on dialysis ([place]) 03/17/2017  . Chronic chest pain 03/17/2017  . Type II diabetes mellitus ([place]) 03/17/2017  . Acute on chronic diastolic heart failure ([place]) 03/17/2017  . Acute heart failure ([place]) 03/17/2017  . Lumbar radiculopathy 01/16/2017  . Pre-transplant evaluation for kidney transplant 06/15/2016  . Pain, unspecified 04/17/2016  . Increased frequency of urination 12/17/2015  . Nocturia 12/17/2015  . Chest pain, non-cardiac 10/09/2015  . Hypercalcemia 05/10/2015  . Other fluid overload 02/24/2015  . 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 Fatty (change of) liver, not elsewhere classified 07/02/2014  . Aftercare including intermittent dialysis ([place]) 06/24/2014  . Diarrhea, unspecified 06/24/2014  . Fever, unspecified 06/24/2014  . Iron deficiency anemia, unspecified 06/24/2014  . Other specified coagulation defects ([place]) 06/24/2014  . Pruritus, unspecified 06/24/2014  . Secondary hyperparathyroidism of renal origin ([place]) 06/24/2014  . Type 2 diabetes mellitus with diabetic peripheral angiopathy without gangrene ([place]) 06/24/2014  . Acute on chronic renal failure ([place]) 06/16/2014  . Shoulder pain, left 12/15/2013  . Chest pain 12/15/2013  . Claudication ([place]) 12/11/2013  . PVD (peripheral  vascular disease) ([place]) 12/11/2013  . Carotid artery disease ([place]) 09/30/2013  . Acute chest pain 11/15/2012  . Bruit 09/15/2010  . CAD (coronary artery disease) nonobstructive per cath 2012   . Hyperlipidemia LDL goal <70 07/22/2009  . Essential hypertension 07/22/2009   Past Medical History:  Diagnosis Date  . Anemia of chronic disease   . CAD (coronary artery disease)    a.  Myoview 4/11: EF 53%, no scar or ischemia   c. MV 2012 Nl perfusion, apical thinning.  No ischemia or scar.  EF 49%, appears greater by visual estimate.;  d.  Dob stress echo 12/13:  Negative Dob stress echo. There is no evidence of ischemia.  The LVF is normal. b. Normal cors 2016.  . Carotid stenosis    a. <55% RICA, >73% LICA by duplex 05/2023  . Chronic chest pain    occ  . Constipation    chronic  . Dyspnea    with extertion  . ESRD (end stage renal disease) on dialysis [facility])    M-W-F- [name]  .  GERD (gastroesophageal reflux disease)   . History of kidney stones    Passed  . HNP (herniated nucleus pulposus), lumbar   . HTN (hypertension)    echo 3/10: EF 60%, LAE  . Hyperlipidemia   . Nephrolithiasis    "passed them all"  . Peripheral arterial disease ([place])    a. s/p PTCA  right   . Pneumonia yrs ago  . Restless legs   . Sciatic leg pain   . Snores    a. presumed OSA, pt has refused sleep eval in past.  . Type II diabetes mellitus ([place])    no longer on medications, checks blood glucose at home  . Urinary frequency     Family History  Problem Relation Age of Onset  . Heart attack Sister        died @ 67  . Cancer Mother        died @ 25; unknown type  . Diabetes Brother        deceased  . Cirrhosis Father        alcohol related  . Diabetes Father   . Esophageal cancer Neg Hx   . Colon cancer Neg Hx   . Pancreatic cancer Neg Hx   . Stomach cancer Neg Hx     Past Surgical History:  Procedure Laterality Date  . ABDOMINAL AORTOGRAM W/LOWER EXTREMITY Bilateral 08/21/2019    Procedure: ABDOMINAL AORTOGRAM W/LOWER EXTREMITY;  Surgeon: Waynetta Sandy, MD;  Location: [place] [facility];  Service: Cardiovascular;  Laterality: Bilateral;  . AMPUTATION Right 07/25/2019   Procedure: RIGHT 5th RAY AMPUTATION;  Surgeon: Newt Minion, MD;  Location: [facility];  Service: Orthopedics;  Laterality: Right;  . AMPUTATION Right 11/19/2019   Procedure: RIGHT TRANSMETATARSAL AMPUTATION;  Surgeon: Newt Minion, MD;  Location: [place];  Service: Orthopedics;  Laterality: Right;  . ANGIOPLASTY / STENTING FEMORAL Left 12/11/2013   dr berry  . AV FISTULA PLACEMENT Left 03/19/2014   Procedure: CREATION OF ARTERIOVENOUS (AV) FISTULA  LEFT UPPER ARM;  Surgeon: Mal Misty, MD;  Location: [place];  Service: Vascular;  Laterality: Left;  . BACK SURGERY  01/2018   screws placed   . CARDIAC CATHETERIZATION  2001 and 2010   . COLONOSCOPY W/ BIOPSIES AND POLYPECTOMY    . COLONOSCOPY WITH PROPOFOL N/A 08/01/2016   Procedure: COLONOSCOPY WITH PROPOFOL;  Surgeon: Carol Ada, MD;  [facility];  Service: Endoscopy;  Laterality: N/A;  . CORONARY ARTERY BYPASS GRAFT  2019   [facility] x [facility] bypass  . ESOPHAGOGASTRODUODENOSCOPY (EGD) WITH PROPOFOL N/A 08/01/2016   Procedure: ESOPHAGOGASTRODUODENOSCOPY (EGD) WITH PROPOFOL;  Surgeon: Carol Ada, MD;  [facility];  Service: Endoscopy;  Laterality: N/A;  . FOOT FRACTURE SURGERY Right    ligament repair  . FRACTURE SURGERY     left forearm  . GRAFT APPLICATION Right 10/13/6431   Procedure: FAT GRAFT APPLICATION;  Surgeon: Evelina Bucy, DPM;  Location: [place];  Service: Podiatry;  Laterality: Right;  . INGUINAL HERNIA REPAIR Left   . LEFT HEART CATH AND CORS/GRAFTS ANGIOGRAPHY N/A 04/27/2017   Procedure: LEFT HEART CATH AND CORS/GRAFTS ANGIOGRAPHY;  Surgeon: Leonie Man, MD;  Location: [place] [facility];  Service: Cardiovascular;  Laterality: N/A;  . LEFT HEART CATHETERIZATION WITH CORONARY ANGIOGRAM  N/A 06/22/2014   Procedure: LEFT HEART CATHETERIZATION WITH CORONARY ANGIOGRAM;  Surgeon: Troy Sine, MD;  Location: [facility] LAB;  Service: Cardiovascular;  Laterality: N/A;  .  LOWER EXTREMITY ANGIOGRAM Left 12/11/2013   Procedure: LOWER EXTREMITY ANGIOGRAM;  Surgeon: Lorretta Harp, MD;  Location: [facility] CATH LAB;  Service: Cardiovascular;  Laterality: Left;  . LUMBAR LAMINECTOMY/DECOMPRESSION MICRODISCECTOMY Right 07/03/2017   Procedure: MICRODISCECTOMY LUMBAR FIVE - SACRAL ONE RIGHT;  Surgeon: Consuella Lose, MD;  Location: [facility];  Service: Neurosurgery;  Laterality: Right;  . LUMBAR LAMINECTOMY/DECOMPRESSION MICRODISCECTOMY Right 10/19/2017   Procedure: MICRODISCECTOMY LUMBAR FIVE- SACRAL 1 ONE ;  Surgeon: Consuella Lose, MD;  Location: [place];  Service: Neurosurgery;  Laterality: Right;  . TONSILLECTOMY AND ADENOIDECTOMY    . WISDOM TOOTH EXTRACTION    . WOUND DEBRIDEMENT Right 05/13/2019   Procedure: DEBRIDEMENT WOUND;  Surgeon: Evelina Bucy, DPM;  Location: [facility];  Service: Podiatry;  Laterality: Right;   Social History   Occupational History  . Occupation: retired  Tobacco Use  . Smoking status: Former Smoker    Packs/day: 1.00    Years: 2.00    Pack years: 2.00    Types: Cigarettes    Quit date: 1976    Years since quitting: 45.6  . Smokeless tobacco: Never Used  . Tobacco comment: quit smoking 40 yrs ago  Vaping Use  . Vaping Use: Never used  Substance and Sexual Activity  . Alcohol use: No    Alcohol/week: 0.0 standard drinks    Comment: h/o social drinking  . Drug use: Not Currently    Types: Marijuana  . Sexual activity: Yes    Birth control/protection: None

## 2019-12-09 ENCOUNTER — Telehealth: Payer: Self-pay | Admitting: Orthopedic Surgery

## 2019-12-09 ENCOUNTER — Other Ambulatory Visit: Payer: Self-pay | Admitting: Orthopedic Surgery

## 2019-12-09 MED ORDER — OXYCODONE HCL 5 MG PO TABS
5.0000 mg | ORAL_TABLET | Freq: Four times a day (QID) | ORAL | 0 refills | Status: DC | PRN
Start: 2019-12-09 — End: 2019-12-25

## 2019-12-09 NOTE — Telephone Encounter (Signed)
Patient called requesting oxycodone or tramadol refill. Please send to pharmacy on file. Please call patient when medication has been sent. Patient phone number is [phone]

## 2019-12-09 NOTE — Telephone Encounter (Signed)
Rx sent 

## 2019-12-09 NOTE — Telephone Encounter (Signed)
Please advise 

## 2019-12-10 ENCOUNTER — Telehealth: Payer: Self-pay

## 2019-12-10 DIAGNOSIS — Z4801 Encounter for change or removal of surgical wound dressing: Secondary | ICD-10-CM | POA: Insufficient documentation

## 2019-12-10 DIAGNOSIS — Z89422 Acquired absence of other left toe(s): Secondary | ICD-10-CM | POA: Insufficient documentation

## 2019-12-10 NOTE — Telephone Encounter (Signed)
Pt is s/p a transmet on 11/19/19 please see message below.

## 2019-12-10 NOTE — Telephone Encounter (Signed)
Ivin Booty, [facility] called stating that patient is in severe pain and would like to know if a different medication could be sent to patent's pharmacy.  Stated that patient's insurance will not cover Rx for Oxycodone.  CB#[phone].  Please advise.  Thank you.

## 2019-12-11 ENCOUNTER — Other Ambulatory Visit: Payer: Self-pay | Admitting: Orthopedic Surgery

## 2019-12-11 MED ORDER — TRAMADOL HCL 50 MG PO TABS: 50.0000 mg | ORAL_TABLET | Freq: Four times a day (QID) | ORAL | 0 refills | Status: DC | PRN

## 2019-12-11 NOTE — Telephone Encounter (Signed)
Pt was in the office on Monday s/p right transmet amputation. Pt states rx that was written on Monday too soon for refill and pharmacy will not fill. Wanted to know if you would call in rx for tramadol for pain. Pt's wife says that he is elevating and that he is minimal wtb to get to the rest room. He has an appt for follow up on Tuesday please advise.

## 2019-12-11 NOTE — Telephone Encounter (Signed)
I called pt and advise rx has been sent to pharm.

## 2019-12-11 NOTE — Telephone Encounter (Signed)
If he is in that much pain he needs to be seen today

## 2019-12-11 NOTE — Telephone Encounter (Signed)
Rx written.

## 2019-12-12 ENCOUNTER — Telehealth: Payer: Self-pay | Admitting: *Deleted

## 2019-12-12 NOTE — Telephone Encounter (Signed)
Jon Anderson PT with [facility] called to report a missed visit with Jon Anderson due to his dialysis schedule and it will be rescheduled for next week.

## 2019-12-16 ENCOUNTER — Ambulatory Visit (INDEPENDENT_AMBULATORY_CARE_PROVIDER_SITE_OTHER): Payer: Medicare Other | Admitting: Physician Assistant

## 2019-12-16 ENCOUNTER — Encounter: Payer: Self-pay | Admitting: Family

## 2019-12-16 VITALS — Ht 71.0 in | Wt 165.0 lb

## 2019-12-16 DIAGNOSIS — M869 Osteomyelitis, unspecified: Secondary | ICD-10-CM

## 2019-12-16 NOTE — Progress Notes (Signed)
Office Visit Note   Patient: Jon Anderson           Date of Birth: [birth year]           MRN: [id number] Visit Date: 12/16/2019              Requested by: [name], MD [id number] [place],  [place] [address] PCP: [name], MD  Chief Complaint  Patient presents with  . Right Foot - Routine Post Op    11/19/19 right transmet amputation       HPI: This is a pleasant gentleman who is almost a month status post right transmetatarsal amputation.  His daughter has been doing his dressing changes.  He is using a nitroglycerin patch.  He continues to struggle with microcirculation and ischemic pain  Assessment & Plan: Visit Diagnoses: No diagnosis found.  Plan: I had a discussion with the patient regarding healing.  He is open to a below-knee amputation but would like to talk to Dr. Sharol Given about this.  I have some concerns given the ischemic looking foot and his pain that this will not have a good chance of healing follow-up in 1 week with Dr. Sharol Given  Follow-Up Instructions: No follow-ups on file.   Ortho Exam  Patient is alert, oriented, no adenopathy, well-dressed, normal affect, normal respiratory effort. Right foot: Continues to have ischemic changes of the amputation stump.  There is a little bit of bleeding.  He is quite painful.  I did remove one suture.  Continues to have biphasic pulses.  He also has a small eschar on his heel and some ischemic changes in his ankle no foul odor no cellulitis  Imaging: No results found. No images are attached to the encounter.  Labs: Lab Results  Component Value Date   HGBA1C 6.4 (H) 07/24/2019   HGBA1C 6.5 (H) 07/18/2018   HGBA1C 6.4 (H) 10/16/2017   ESRSEDRATE 22 (H) 07/24/2019   CRP <0.5 07/24/2019   REPTSTATUS 07/29/2019 FINAL 07/24/2019   CULT  07/24/2019    NO GROWTH 5 DAYS Performed at [facility], [place] [address] [place], [place] [address]      Lab Results  Component Value Date   ALBUMIN 3.1 (L)  12/01/2019   ALBUMIN 3.1 (L) 11/28/2019   ALBUMIN 3.1 (L) 11/26/2019   PREALBUMIN 28.0 07/24/2019    Lab Results  Component Value Date   MG 1.8 04/26/2019   MG 1.8 04/24/2019   MG 2.1 03/22/2017   No results found for: VD25OH  Lab Results  Component Value Date   PREALBUMIN 28.0 07/24/2019   CBC EXTENDED Latest Ref Rng & Units 12/01/2019 11/28/2019 11/26/2019  WBC 4.0 - 10.5 K/uL 8.3 9.2 9.1  RBC 4.22 - 5.81 MIL/uL 3.65(L) 3.74(L) 3.92(L)  HGB 13.0 - 17.0 g/dL 10.7(L) 11.2(L) 12.0(L)  HCT 39 - 52 % 33.9(L) 35.3(L) 36.7(L)  PLT 150 - 400 K/uL 329 254 189  NEUTROABS 1.7 - 7.7 K/uL - - -  LYMPHSABS 0.7 - 4.0 K/uL - - -     Body mass index is 23.01 kg/m.  Orders:  No orders of the defined types were placed in this encounter.  No orders of the defined types were placed in this encounter.    Procedures: No procedures performed  Clinical Data: No additional findings.  ROS:  All other systems negative, except as noted in the HPI. Review of Systems  Objective: Vital Signs: Ht 5\' 11"  (1.803 m)   Wt 165 lb (  74.8 kg)   BMI 23.01 kg/m   Specialty Comments:  No specialty comments available.  PMFS History: Patient Active Problem List   Diagnosis Date Noted  . Right below-knee amputee ([place]) 11/25/2019  . S/P transmetatarsal amputation of foot, right ([place]) 11/25/2019  . Gangrene of right foot ([place]) 11/19/2019  . History of partial ray amputation of fifth toe of right foot ([place]) 09/09/2019  . Ischemic ulcer diabetic foot ([place]) 09/09/2019  . Subacute osteomyelitis, right ankle and foot ([place])   . Diabetic foot infection ([place]) 07/24/2019  . Chronic diastolic CHF (congestive heart failure) ([place]) 07/24/2019  . Diabetic ulcer of right midfoot associated with diabetes mellitus due to underlying condition, with fat layer exposed ([place])   . Gastroenteritis 04/24/2019  . Allergy, unspecified, initial encounter 01/27/2019  . DM neuropathy with neurologic complication ([place])  02/01/8526  . GERD (gastroesophageal reflux disease) 02/28/2018  . Nephrolithiasis 02/28/2018  . Sciatic leg pain 02/28/2018  . Snores 02/28/2018  . Orthopnea 02/23/2018  . Elevated troponin 02/23/2018  . HNP (herniated nucleus pulposus), lumbar 07/03/2017  . Encounter for removal of sutures 06/01/2017  . Chest pain in adult 04/27/2017  . Restless leg syndrome, uncontrolled 04/27/2017  . 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 Anemia of chronic disease 03/17/2017  . ESRD (end stage renal disease) on dialysis ([place]) 03/17/2017  . Chronic chest pain 03/17/2017  . Type II diabetes mellitus ([place]) 03/17/2017  . Acute on chronic diastolic heart failure ([place]) 03/17/2017  . Acute heart failure ([place]) 03/17/2017  . Lumbar radiculopathy 01/16/2017  . Pre-transplant evaluation for kidney transplant 06/15/2016  . Pain, unspecified 04/17/2016  . Increased frequency of urination 12/17/2015  . Nocturia 12/17/2015  . Chest pain, non-cardiac 10/09/2015  . Hypercalcemia 05/10/2015  . Other fluid overload 02/24/2015  . 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 Fatty (change of) liver, not elsewhere classified 07/02/2014  . Aftercare including intermittent dialysis ([place]) 06/24/2014  . Diarrhea, unspecified 06/24/2014  . Fever, unspecified 06/24/2014  . Iron deficiency anemia, unspecified 06/24/2014  . Other specified coagulation defects ([place]) 06/24/2014  . Pruritus, unspecified 06/24/2014  . Secondary hyperparathyroidism of renal origin ([place]) 06/24/2014  . Type 2 diabetes mellitus with diabetic peripheral angiopathy without gangrene ([place]) 06/24/2014  . Acute on chronic renal failure ([place]) 06/16/2014  . Shoulder pain, left 12/15/2013  . Chest pain 12/15/2013  . Claudication ([place]) 12/11/2013  . PVD (peripheral vascular disease) ([place]) 12/11/2013  . Carotid artery disease ([place]) 09/30/2013  . Acute chest pain 11/15/2012  . Bruit 09/15/2010  . CAD  (coronary artery disease) nonobstructive per cath 2012   . Hyperlipidemia LDL goal <70 07/22/2009  . Essential hypertension 07/22/2009   Past Medical History:  Diagnosis Date  . Anemia of chronic disease   . CAD (coronary artery disease)    a.  Myoview 4/11: EF 53%, no scar or ischemia   c. MV 2012 Nl perfusion, apical thinning.  No ischemia or scar.  EF 49%, appears greater by visual estimate.;  d.  Dob stress echo 12/13:  Negative Dob stress echo. There is no evidence of ischemia.  The LVF is normal. b. Normal cors 2016.  . Carotid stenosis    a. <78% RICA, >24% LICA by duplex 05/3534  . Chronic chest pain    occ  . Constipation    chronic  . Dyspnea    with extertion  . ESRD (end stage renal disease) on dialysis ([place])  M-W-FRicharda Blade  . GERD (gastroesophageal reflux disease)   . History of kidney stones    Passed  . HNP (herniated nucleus pulposus), lumbar   . HTN (hypertension)    echo 3/10: EF 60%, LAE  . Hyperlipidemia   . Nephrolithiasis    "passed them all"  . Peripheral arterial disease ([place])    a. s/p PTCA  right   . Pneumonia yrs ago  . Restless legs   . Sciatic leg pain   . Snores    a. presumed OSA, pt has refused sleep eval in past.  . Type II diabetes mellitus ([place])    no longer on medications, checks blood glucose at home  . Urinary frequency     Family History  Problem Relation Age of Onset  . Heart attack Sister        died @ 16  . Cancer Mother        died @ 79; unknown type  . Diabetes Brother        deceased  . Cirrhosis Father        alcohol related  . Diabetes Father   . Esophageal cancer Neg Hx   . Colon cancer Neg Hx   . Pancreatic cancer Neg Hx   . Stomach cancer Neg Hx     Past Surgical History:  Procedure Laterality Date  . ABDOMINAL AORTOGRAM W/LOWER EXTREMITY Bilateral 08/21/2019   Procedure: ABDOMINAL AORTOGRAM W/LOWER EXTREMITY;  Surgeon: Waynetta Sandy, MD;  Location: [place] [facility];  Service: Cardiovascular;   Laterality: Bilateral;  . AMPUTATION Right 07/25/2019   Procedure: RIGHT 5th RAY AMPUTATION;  Surgeon: Newt Minion, MD;  Location: [facility];  Service: Orthopedics;  Laterality: Right;  . AMPUTATION Right 11/19/2019   Procedure: RIGHT TRANSMETATARSAL AMPUTATION;  Surgeon: Newt Minion, MD;  Location: [facility];  Service: Orthopedics;  Laterality: Right;  . ANGIOPLASTY / STENTING FEMORAL Left 12/11/2013   dr berry  . AV FISTULA PLACEMENT Left 03/19/2014   Procedure: CREATION OF ARTERIOVENOUS (AV) FISTULA  LEFT UPPER ARM;  Surgeon: Mal Misty, MD;  Location: [facility];  Service: Vascular;  Laterality: Left;  . BACK SURGERY  01/2018   screws placed   . CARDIAC CATHETERIZATION  2001 and 2010   . COLONOSCOPY W/ BIOPSIES AND POLYPECTOMY    . COLONOSCOPY WITH PROPOFOL N/A 08/01/2016   Procedure: COLONOSCOPY WITH PROPOFOL;  Surgeon: Carol Ada, MD;  Location: [facility];  Service: Endoscopy;  Laterality: N/A;  . CORONARY ARTERY BYPASS GRAFT  2019   [facility] x 1 bypass  . ESOPHAGOGASTRODUODENOSCOPY (EGD) WITH PROPOFOL N/A 08/01/2016   Procedure: ESOPHAGOGASTRODUODENOSCOPY (EGD) WITH PROPOFOL;  Surgeon: Carol Ada, MD;  Location: [facility];  Service: Endoscopy;  Laterality: N/A;  . FOOT FRACTURE SURGERY Right    ligament repair  . FRACTURE SURGERY     left forearm  . GRAFT APPLICATION Right 04/15/1094   Procedure: FAT GRAFT APPLICATION;  Surgeon: Evelina Bucy, DPM;  Location: [facility];  Service: Podiatry;  Laterality: Right;  . INGUINAL HERNIA REPAIR Left   . LEFT HEART CATH AND CORS/GRAFTS ANGIOGRAPHY N/A 04/27/2017   Procedure: LEFT HEART CATH AND CORS/GRAFTS ANGIOGRAPHY;  Surgeon: Leonie Man, MD;  Location: [facility];  Service: Cardiovascular;  Laterality: N/A;  . LEFT HEART CATHETERIZATION WITH CORONARY ANGIOGRAM N/A 06/22/2014   Procedure: LEFT HEART CATHETERIZATION WITH CORONARY ANGIOGRAM;  Surgeon: Troy Sine, MD;  Location: [facility];  Service:  Cardiovascular;  Laterality: N/A;  . LOWER EXTREMITY ANGIOGRAM Left 12/11/2013   Procedure: LOWER EXTREMITY ANGIOGRAM;  Surgeon: Lorretta Harp, MD;  Location: [facility];  Service: Cardiovascular;  Laterality: Left;  . LUMBAR LAMINECTOMY/DECOMPRESSION MICRODISCECTOMY Right 07/03/2017   Procedure: MICRODISCECTOMY LUMBAR FIVE - SACRAL ONE RIGHT;  Surgeon: Consuella Lose, MD;  Location: [facility];  Service: Neurosurgery;  Laterality: Right;  . LUMBAR LAMINECTOMY/DECOMPRESSION MICRODISCECTOMY Right 10/19/2017   Procedure: MICRODISCECTOMY LUMBAR FIVE- SACRAL 1 ONE ;  Surgeon: Consuella Lose, MD;  Location: [facility];  Service: Neurosurgery;  Laterality: Right;  . TONSILLECTOMY AND ADENOIDECTOMY    . WISDOM TOOTH EXTRACTION    . WOUND DEBRIDEMENT Right 05/13/2019   Procedure: DEBRIDEMENT WOUND;  Surgeon: Evelina Bucy, DPM;  Location[facility];  Service: Podiatry;  Laterality: Right;   Social History   Occupational History  . Occupation: retired  Tobacco Use  . Smoking status: Former Smoker    Packs/day: 1.00    Years: 2.00    Pack years: 2.00    Types: Cigarettes    Quit date: 1976    Years since quitting: 45.7  . Smokeless tobacco: Never Used  . Tobacco comment: quit smoking 40 yrs ago  Vaping Use  . Vaping Use: Never used  Substance and Sexual Activity  . Alcohol use: No    Alcohol/week: 0.0 standard drinks    Comment: h/o social drinking  . Drug use: Not Currently    Types: Marijuana  . Sexual activity: Yes    Birth control/protection: None

## 2019-12-17 ENCOUNTER — Encounter: Payer: Medicare Other | Admitting: Physical Medicine and Rehabilitation

## 2019-12-18 ENCOUNTER — Encounter: Payer: Medicare Other | Admitting: Physical Medicine and Rehabilitation

## 2019-12-20 ENCOUNTER — Other Ambulatory Visit: Payer: Self-pay | Admitting: Orthopedic Surgery

## 2019-12-22 NOTE — Telephone Encounter (Signed)
Do you want to refill? 

## 2019-12-25 ENCOUNTER — Encounter: Payer: Self-pay | Admitting: Orthopedic Surgery

## 2019-12-25 ENCOUNTER — Ambulatory Visit (INDEPENDENT_AMBULATORY_CARE_PROVIDER_SITE_OTHER): Payer: Medicare Other | Admitting: Orthopedic Surgery

## 2019-12-25 VITALS — Ht 71.0 in | Wt 165.0 lb

## 2019-12-25 DIAGNOSIS — T8781 Dehiscence of amputation stump: Secondary | ICD-10-CM

## 2019-12-25 DIAGNOSIS — I739 Peripheral vascular disease, unspecified: Secondary | ICD-10-CM

## 2019-12-25 DIAGNOSIS — Z89431 Acquired absence of right foot: Secondary | ICD-10-CM

## 2019-12-25 MED ORDER — OXYCODONE HCL 5 MG PO TABS: 5.0000 mg | ORAL_TABLET | Freq: Four times a day (QID) | ORAL | 0 refills | Status: DC | PRN

## 2019-12-25 NOTE — Progress Notes (Signed)
Office Visit Note   Patient: Jon Anderson           Date of Birth: [birth year]           MRN: [id number] Visit Date: 12/25/2019              Requested by: [name], MD [address] [place],  [place] [address] PCP: [name], MD  Chief Complaint  Patient presents with  . Right Foot - Routine Post Op    11/19/19 right transmet amputation       HPI: Patient is a 69 year old gentleman is 5 weeks status post right transmetatarsal amputation patient complains of severe pain around the right foot he states he does have a little bit of bloody drainage he states he cannot sleep secondary to the pain.  He has been doing dry dressing changes.  Patient is currently on doxycycline and to improve microcirculation is on a nitroglycerin patch Plavix and Trental.  He is a type II diabetic with end-stage renal disease on dialysis  Assessment & Plan: Visit Diagnoses:  1. PVD (peripheral vascular disease) ([place])   2. Dehiscence of amputation stump ([place])   3. History of transmetatarsal amputation of right foot ([place])     Plan: Patient was called in a prescription for oxycodone.  Discussed that with the ischemic changes his best option would be to proceed with a transtibial amputation.  Patient states he does not want to make a decision today and wants to discuss this with family and wants to follow-up in 2 weeks.  Follow-Up Instructions: Return in about 2 weeks (around 01/08/2020).   Ortho Exam  Patient is alert, oriented, no adenopathy, well-dressed, normal affect, normal respiratory effort. Examination there is ischemic eschar over the amputation site there is no dehiscence of the wound and there is granulation tissue at the wound bed there is no exposed bone.  Patient also has black eschar dorsally over the ankle as well as over the heel neither of these ischemic ulcers are full-thickness.  Imaging: No results found. No images are attached to the encounter.  Labs: Lab Results    Component Value Date   HGBA1C 6.4 (H) 07/24/2019   HGBA1C 6.5 (H) 07/18/2018   HGBA1C 6.4 (H) 10/16/2017   ESRSEDRATE 22 (H) 07/24/2019   CRP <0.5 07/24/2019   REPTSTATUS 07/29/2019 FINAL 07/24/2019   CULT  07/24/2019    NO GROWTH 5 DAYS Performed at [facility], [place] [address], [place], [place] [address]      Lab Results  Component Value Date   ALBUMIN 3.1 (L) 12/01/2019   ALBUMIN 3.1 (L) 11/28/2019   ALBUMIN 3.1 (L) 11/26/2019   PREALBUMIN 28.0 07/24/2019    Lab Results  Component Value Date   MG 1.8 04/26/2019   MG 1.8 04/24/2019   MG 2.1 03/22/2017   No results found for: VD25OH  Lab Results  Component Value Date   PREALBUMIN 28.0 07/24/2019   CBC EXTENDED Latest Ref Rng & Units 12/01/2019 11/28/2019 11/26/2019  WBC 4.0 - 10.5 K/uL 8.3 9.2 9.1  RBC 4.22 - 5.81 MIL/uL 3.65(L) 3.74(L) 3.92(L)  HGB 13.0 - 17.0 g/dL 10.7(L) 11.2(L) 12.0(L)  HCT 39 - 52 % 33.9(L) 35.3(L) 36.7(L)  PLT 150 - 400 K/uL 329 254 189  NEUTROABS 1.7 - 7.7 K/uL - - -  LYMPHSABS 0.7 - 4.0 K/uL - - -     Body mass index is 23.01 kg/m.  Orders:  No orders of the defined types  were placed in this encounter.  Meds ordered this encounter  Medications  . oxyCODONE (OXY IR/ROXICODONE) 5 MG immediate release tablet    Sig: Take 1-2 tablets (5-10 mg total) by mouth every 6 (six) hours as needed for moderate pain (pain score 4-6).    Dispense:  20 tablet    Refill:  0     Procedures: No procedures performed  Clinical Data: No additional findings.  ROS:  All other systems negative, except as noted in the HPI. Review of Systems  Objective: Vital Signs: Ht 5\' 11"  (1.803 m)   Wt 165 lb (74.8 kg)   BMI 23.01 kg/m   Specialty Comments:  No specialty comments available.  PMFS History: Patient Active Problem List   Diagnosis Date Noted  . Right below-knee amputee ([place]) 11/25/2019  . S/P transmetatarsal amputation of foot, right ([place]) 11/25/2019  . Gangrene of right foot  ([place]) 11/19/2019  . History of partial ray amputation of fifth toe of right foot ([place]) 09/09/2019  . Ischemic ulcer diabetic foot ([place]) 09/09/2019  . Subacute osteomyelitis, right ankle and foot ([place])   . Diabetic foot infection ([place]) 07/24/2019  . Chronic diastolic CHF (congestive heart failure) ([place]) 07/24/2019  . Diabetic ulcer of right midfoot associated with diabetes mellitus due to underlying condition, with fat layer exposed ([place])   . Gastroenteritis 04/24/2019  . Allergy, unspecified, initial encounter 01/27/2019  . DM neuropathy with neurologic complication ([place]) 62/13/0865  . GERD (gastroesophageal reflux disease) 02/28/2018  . Nephrolithiasis 02/28/2018  . Sciatic leg pain 02/28/2018  . Snores 02/28/2018  . Orthopnea 02/23/2018  . Elevated troponin 02/23/2018  . HNP (herniated nucleus pulposus), lumbar 07/03/2017  . Encounter for removal of sutures 06/01/2017  . Chest pain in adult 04/27/2017  . Restless leg syndrome, uncontrolled 04/27/2017  . 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 Anemia of chronic disease 03/17/2017  . ESRD (end stage renal disease) on dialysis ([place]) 03/17/2017  . Chronic chest pain 03/17/2017  . Type II diabetes mellitus ([place]) 03/17/2017  . Acute on chronic diastolic heart failure ([place]) 03/17/2017  . Acute heart failure ([place]) 03/17/2017  . Lumbar radiculopathy 01/16/2017  . Pre-transplant evaluation for kidney transplant 06/15/2016  . Pain, unspecified 04/17/2016  . Increased frequency of urination 12/17/2015  . Nocturia 12/17/2015  . Chest pain, non-cardiac 10/09/2015  . Hypercalcemia 05/10/2015  . Other fluid overload 02/24/2015  . 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 Fatty (change of) liver, not elsewhere classified 07/02/2014  . Aftercare including intermittent dialysis ([place]) 06/24/2014  . Diarrhea, unspecified 06/24/2014  . Fever, unspecified 06/24/2014  . Iron deficiency anemia,  unspecified 06/24/2014  . Other specified coagulation defects ([place]) 06/24/2014  . Pruritus, unspecified 06/24/2014  . Secondary hyperparathyroidism of renal origin ([place]) 06/24/2014  . Type 2 diabetes mellitus with diabetic peripheral angiopathy without gangrene ([place]) 06/24/2014  . Acute on chronic renal failure ([place]) 06/16/2014  . Shoulder pain, left 12/15/2013  . Chest pain 12/15/2013  . Claudication ([place]) 12/11/2013  . PVD (peripheral vascular disease) ([place]) 12/11/2013  . Carotid artery disease ([place]) 09/30/2013  . Acute chest pain 11/15/2012  . Bruit 09/15/2010  . CAD (coronary artery disease) nonobstructive per cath 2012   . Hyperlipidemia LDL goal <70 07/22/2009  . Essential hypertension 07/22/2009   Past Medical History:  Diagnosis Date  . Anemia of chronic disease   . CAD (coronary artery disease)    a.  Myoview 4/11: EF 53%, no scar  or ischemia   c. MV 2012 Nl perfusion, apical thinning.  No ischemia or scar.  EF 49%, appears greater by visual estimate.;  d.  Dob stress echo 12/13:  Negative Dob stress echo. There is no evidence of ischemia.  The LVF is normal. b. Normal cors 2016.  . Carotid stenosis    a. <40% RICA, >08% LICA by duplex 09/7617  . Chronic chest pain    occ  . Constipation    chronic  . Dyspnea    with extertion  . ESRD (end stage renal disease) on dialysis [facility])    M-W-F- [name]  . GERD (gastroesophageal reflux disease)   . History of kidney stones    Passed  . HNP (herniated nucleus pulposus), lumbar   . HTN (hypertension)    echo 3/10: EF 60%, LAE  . Hyperlipidemia   . Nephrolithiasis    "passed them all"  . Peripheral arterial disease ([place])    a. s/p PTCA  right   . Pneumonia yrs ago  . Restless legs   . Sciatic leg pain   . Snores    a. presumed OSA, pt has refused sleep eval in past.  . Type II diabetes mellitus ([place])    no longer on medications, checks blood glucose at home  . Urinary frequency     Family History  Problem Relation  Age of Onset  . Heart attack Sister        died @ 26  . Cancer Mother        died @ 36; unknown type  . Diabetes Brother        deceased  . Cirrhosis Father        alcohol related  . Diabetes Father   . Esophageal cancer Neg Hx   . Colon cancer Neg Hx   . Pancreatic cancer Neg Hx   . Stomach cancer Neg Hx     Past Surgical History:  Procedure Laterality Date  . ABDOMINAL AORTOGRAM W/LOWER EXTREMITY Bilateral 08/21/2019   Procedure: ABDOMINAL AORTOGRAM W/LOWER EXTREMITY;  Surgeon: Waynetta Sandy, MD;  Location: [place] [facility];  Service: Cardiovascular;  Laterality: Bilateral;  . AMPUTATION Right 07/25/2019   Procedure: RIGHT 5th RAY AMPUTATION;  Surgeon: Newt Minion, MD;  Location: [place];  Service: Orthopedics;  Laterality: Right;  . AMPUTATION Right 11/19/2019   Procedure: RIGHT TRANSMETATARSAL AMPUTATION;  Surgeon: Newt Minion, MD;  Location: [place];  Service: Orthopedics;  Laterality: Right;  . ANGIOPLASTY / STENTING FEMORAL Left 12/11/2013   dr berry  . AV FISTULA PLACEMENT Left 03/19/2014   Procedure: CREATION OF ARTERIOVENOUS (AV) FISTULA  LEFT UPPER ARM;  Surgeon: Mal Misty, MD;  Location: [place];  Service: Vascular;  Laterality: Left;  . BACK SURGERY  01/2018   screws placed   . CARDIAC CATHETERIZATION  2001 and 2010   . COLONOSCOPY W/ BIOPSIES AND POLYPECTOMY    . COLONOSCOPY WITH PROPOFOL N/A 08/01/2016   Procedure: COLONOSCOPY WITH PROPOFOL;  Surgeon: Carol Ada, MD;  [facility];  Service: Endoscopy;  Laterality: N/A;  . CORONARY ARTERY BYPASS GRAFT  2019   [facility] bypass  . ESOPHAGOGASTRODUODENOSCOPY (EGD) WITH PROPOFOL N/A 08/01/2016   Procedure: ESOPHAGOGASTRODUODENOSCOPY (EGD) WITH PROPOFOL;  Surgeon: Carol Ada, MD;  [facility];  Service: Endoscopy;  Laterality: N/A;  . FOOT FRACTURE SURGERY Right    ligament repair  . FRACTURE SURGERY     left forearm  . GRAFT APPLICATION Right  05/13/2019   Procedure: FAT GRAFT  APPLICATION;  Surgeon: Evelina Bucy, DPM;  Location[facility];  Service: Podiatry;  Laterality: Right;  . INGUINAL HERNIA REPAIR Left   . LEFT HEART CATH AND CORS/GRAFTS ANGIOGRAPHY N/A 04/27/2017   Procedure: LEFT HEART CATH AND CORS/GRAFTS ANGIOGRAPHY;  Surgeon: Leonie Man, MD;  Location: [place] [facility];  Service: Cardiovascular;  Laterality: N/A;  . LEFT HEART CATHETERIZATION WITH CORONARY ANGIOGRAM N/A 06/22/2014   Procedure: LEFT HEART CATHETERIZATION WITH CORONARY ANGIOGRAM;  Surgeon: Troy Sine, MD;  Location: [facility] LAB;  Service: Cardiovascular;  Laterality: N/A;  . LOWER EXTREMITY ANGIOGRAM Left 12/11/2013   Procedure: LOWER EXTREMITY ANGIOGRAM;  Surgeon: Lorretta Harp, MD;  Location: [facility] C[facility] LAB;  Service: Cardiovascular;  Laterality: Left;  . LUMBAR LAMINECTOMY/DECOMPRESSION MICRODISCECTOMY Right 07/03/2017   Procedure: MICRODISCECTOMY LUMBAR FIVE - SACRAL ONE RIGHT;  Surgeon: Consuella Lose, MD;  Location: [place];  Service: Neurosurgery;  Laterality: Right;  . LUMBAR LAMINECTOMY/DECOMPRESSION MICRODISCECTOMY Right 10/19/2017   Procedure: MICRODISCECTOMY LUMBAR FIVE- SACRAL 1 ONE ;  Surgeon: Consuella Lose, MD;  Location: [place];  Service: Neurosurgery;  Laterality: Right;  . TONSILLECTOMY AND ADENOIDECTOMY    . WISDOM TOOTH EXTRACTION    . WOUND DEBRIDEMENT Right 05/13/2019   Procedure: DEBRIDEMENT WOUND;  Surgeon: Evelina Bucy, DPM;  Location[facility];  Service: Podiatry;  Laterality: Right;   Social History   Occupational History  . Occupation: retired  Tobacco Use  . Smoking status: Former Smoker    Packs/day: 1.00    Years: 2.00    Pack years: 2.00    Types: Cigarettes    Quit date: 1976    Years since quitting: 45.7  . Smokeless tobacco: Never Used  . Tobacco comment: quit smoking 40 yrs ago  Vaping Use  . Vaping Use: Never used  Substance and Sexual Activity  . Alcohol use: No    Alcohol/week: 0.0  standard drinks    Comment: h/o social drinking  . Drug use: Not Currently    Types: Marijuana  . Sexual activity: Yes    Birth control/protection: None

## 2019-12-26 ENCOUNTER — Telehealth: Payer: Self-pay | Admitting: *Deleted

## 2019-12-26 ENCOUNTER — Other Ambulatory Visit: Payer: Self-pay | Admitting: Physician Assistant

## 2019-12-26 NOTE — Telephone Encounter (Signed)
Mickel Baas PT called to request PT POC 1wk6.  Approval given.

## 2020-01-08 ENCOUNTER — Ambulatory Visit (INDEPENDENT_AMBULATORY_CARE_PROVIDER_SITE_OTHER): Payer: Medicare Other | Admitting: Orthopedic Surgery

## 2020-01-08 ENCOUNTER — Other Ambulatory Visit: Payer: Self-pay

## 2020-01-08 ENCOUNTER — Encounter: Payer: Self-pay | Admitting: Orthopedic Surgery

## 2020-01-08 VITALS — Ht 71.0 in | Wt 165.0 lb

## 2020-01-08 DIAGNOSIS — Z89431 Acquired absence of right foot: Secondary | ICD-10-CM

## 2020-01-13 ENCOUNTER — Other Ambulatory Visit: Payer: Self-pay | Admitting: Physician Assistant

## 2020-01-14 ENCOUNTER — Telehealth: Payer: Self-pay | Admitting: Orthopedic Surgery

## 2020-01-14 ENCOUNTER — Other Ambulatory Visit: Payer: Self-pay | Admitting: Physician Assistant

## 2020-01-14 MED ORDER — TRAMADOL HCL 50 MG PO TABS: 50.0000 mg | ORAL_TABLET | Freq: Four times a day (QID) | ORAL | 0 refills | Status: DC | PRN

## 2020-01-14 NOTE — Telephone Encounter (Signed)
Pt is s/p a transmet amp 11/19/19 asking for refill please advise.

## 2020-01-14 NOTE — Telephone Encounter (Signed)
Patient called requesting a refill of tramadol. Please send to pharmacy on file. Patient phone number is [phone].

## 2020-01-14 NOTE — Telephone Encounter (Signed)
Rx called in 

## 2020-01-15 ENCOUNTER — Encounter: Payer: Self-pay | Admitting: Orthopedic Surgery

## 2020-01-15 ENCOUNTER — Telehealth: Payer: Self-pay | Admitting: Orthopedic Surgery

## 2020-01-15 NOTE — Progress Notes (Signed)
Office Visit Note   Patient: Jon Anderson           Date of Birth: 1950/06/04           MRN: [id number] Visit Date: 01/08/2020              Requested by: [name], MD [address] [place],  [place] [address] PCP: [name], MD  Chief Complaint  Patient presents with  . Right Foot - Follow-up    11/19/19 right transmet amputation      HPI: Patient is a 69 year old gentleman who is seen in follow-up status post transmetatarsal amputation on the right.  Patient states he has a little bit of drainage the sutures are intact he has been using nitroglycerin patches he is still on antibiotics.  He states he has muscle spasms with dialysis.  Patient is approximately 6 weeks out from surgery  Assessment & Plan: Visit Diagnoses:  1. History of transmetatarsal amputation of right foot ([place])     Plan: Recommended continue nitroglycerin patch work on dorsiflexion of the ankle stiff soled sneakers he could try some coconut water to help with the cramps.  Follow-Up Instructions: Return in about 2 weeks (around 01/22/2020).   Ortho Exam  Patient is alert, oriented, no adenopathy, well-dressed, normal affect, normal respiratory effort. Examination patient has a palpable dorsalis pedis pulse.  There is eschar over the incision but no open wound.  There is a small amount of serosanguineous drainage.  No cellulitis.  Imaging: No results found. No images are attached to the encounter.  Labs: Lab Results  Component Value Date   HGBA1C 6.4 (H) 07/24/2019   HGBA1C 6.5 (H) 07/18/2018   HGBA1C 6.4 (H) 10/16/2017   ESRSEDRATE 22 (H) 07/24/2019   CRP <0.5 07/24/2019   REPTSTATUS 07/29/2019 FINAL 07/24/2019   CULT  07/24/2019    NO GROWTH 5 DAYS Performed at [facility], [place] [address], [place], [place] [address]      Lab Results  Component Value Date   ALBUMIN 3.1 (L) 12/01/2019   ALBUMIN 3.1 (L) 11/28/2019   ALBUMIN 3.1 (L) 11/26/2019   PREALBUMIN 28.0 07/24/2019      Lab Results  Component Value Date   MG 1.8 04/26/2019   MG 1.8 04/24/2019   MG 2.1 03/22/2017   No results found for: VD25OH  Lab Results  Component Value Date   PREALBUMIN 28.0 07/24/2019   CBC EXTENDED Latest Ref Rng & Units 12/01/2019 11/28/2019 11/26/2019  WBC 4.0 - 10.5 K/uL 8.3 9.2 9.1  RBC 4.22 - 5.81 MIL/uL 3.65(L) 3.74(L) 3.92(L)  HGB 13.0 - 17.0 g/dL 10.7(L) 11.2(L) 12.0(L)  HCT 39 - 52 % 33.9(L) 35.3(L) 36.7(L)  PLT 150 - 400 K/uL 329 254 189  NEUTROABS 1.7 - 7.7 K/uL - - -  LYMPHSABS 0.7 - 4.0 K/uL - - -     Body mass index is 23.01 kg/m.  Orders:  No orders of the defined types were placed in this encounter.  No orders of the defined types were placed in this encounter.    Procedures: No procedures performed  Clinical Data: No additional findings.  ROS:  All other systems negative, except as noted in the HPI. Review of Systems  Objective: Vital Signs: Ht 5\' 11"  (1.803 m)   Wt 165 lb (74.8 kg)   BMI 23.01 kg/m   Specialty Comments:  No specialty comments available.  PMFS History: Patient Active Problem List   Diagnosis Date Noted  .  Right below-knee amputee ([place]) 11/25/2019  . S/P transmetatarsal amputation of foot, right ([place]) 11/25/2019  . Gangrene of right foot ([place]) 11/19/2019  . History of partial ray amputation of fifth toe of right foot ([place]) 09/09/2019  . Ischemic ulcer diabetic foot ([place]) 09/09/2019  . Subacute osteomyelitis, right ankle and foot ([place])   . Diabetic foot infection ([place]) 07/24/2019  . Chronic diastolic CHF (congestive heart failure) ([place]) 07/24/2019  . Diabetic ulcer of right midfoot associated with diabetes mellitus due to underlying condition, with fat layer exposed ([place])   . Gastroenteritis 04/24/2019  . Allergy, unspecified, initial encounter 01/27/2019  . DM neuropathy with neurologic complication ([place]) 80/06/4915  . GERD (gastroesophageal reflux disease) 02/28/2018  . Nephrolithiasis 02/28/2018  .  Sciatic leg pain 02/28/2018  . Snores 02/28/2018  . Orthopnea 02/23/2018  . Elevated troponin 02/23/2018  . HNP (herniated nucleus pulposus), lumbar 07/03/2017  . Encounter for removal of sutures 06/01/2017  . Chest pain in adult 04/27/2017  . Restless leg syndrome, uncontrolled 04/27/2017  . 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 Anemia of chronic disease 03/17/2017  . ESRD (end stage renal disease) on dialysis ([place]) 03/17/2017  . Chronic chest pain 03/17/2017  . Type II diabetes mellitus ([place]) 03/17/2017  . Acute on chronic diastolic heart failure ([place]) 03/17/2017  . Acute heart failure ([place]) 03/17/2017  . Lumbar radiculopathy 01/16/2017  . Pre-transplant evaluation for kidney transplant 06/15/2016  . Pain, unspecified 04/17/2016  . Increased frequency of urination 12/17/2015  . Nocturia 12/17/2015  . Chest pain, non-cardiac 10/09/2015  . Hypercalcemia 05/10/2015  . Other fluid overload 02/24/2015  . 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 Fatty (change of) liver, not elsewhere classified 07/02/2014  . Aftercare including intermittent dialysis ([place]) 06/24/2014  . Diarrhea, unspecified 06/24/2014  . Fever, unspecified 06/24/2014  . Iron deficiency anemia, unspecified 06/24/2014  . Other specified coagulation defects ([place]) 06/24/2014  . Pruritus, unspecified 06/24/2014  . Secondary hyperparathyroidism of renal origin ([place]) 06/24/2014  . Type 2 diabetes mellitus with diabetic peripheral angiopathy without gangrene ([place]) 06/24/2014  . Acute on chronic renal failure ([place]) 06/16/2014  . Shoulder pain, left 12/15/2013  . Chest pain 12/15/2013  . Claudication ([place]) 12/11/2013  . PVD (peripheral vascular disease) ([place]) 12/11/2013  . Carotid artery disease ([place]) 09/30/2013  . Acute chest pain 11/15/2012  . Bruit 09/15/2010  . CAD (coronary artery disease) nonobstructive per cath 2012   . Hyperlipidemia LDL goal <70 07/22/2009  .  Essential hypertension 07/22/2009   Past Medical History:  Diagnosis Date  . Anemia of chronic disease   . CAD (coronary artery disease)    a.  Myoview 4/11: EF 53%, no scar or ischemia   c. MV 2012 Nl perfusion, apical thinning.  No ischemia or scar.  EF 49%, appears greater by visual estimate.;  d.  Dob stress echo 12/13:  Negative Dob stress echo. There is no evidence of ischemia.  The LVF is normal. b. Normal cors 2016.  . Carotid stenosis    a. <91% RICA, >50% LICA by duplex 08/6977  . Chronic chest pain    occ  . Constipation    chronic  . Dyspnea    with extertion  . ESRD (end stage renal disease) on dialysis [facility])    M-W-F- [name]  . GERD (gastroesophageal reflux disease)   . History of kidney stones    Passed  . HNP (herniated nucleus pulposus), lumbar   .  HTN (hypertension)    echo 3/10: EF 60%, LAE  . Hyperlipidemia   . Nephrolithiasis    "passed them all"  . Peripheral arterial disease ([place])    a. s/p PTCA  right   . Pneumonia yrs ago  . Restless legs   . Sciatic leg pain   . Snores    a. presumed OSA, pt has refused sleep eval in past.  . Type II diabetes mellitus ([place])    no longer on medications, checks blood glucose at home  . Urinary frequency     Family History  Problem Relation Age of Onset  . Heart attack Sister        died @ 74  . Cancer Mother        died @ 72; unknown type  . Diabetes Brother        deceased  . Cirrhosis Father        alcohol related  . Diabetes Father   . Esophageal cancer Neg Hx   . Colon cancer Neg Hx   . Pancreatic cancer Neg Hx   . Stomach cancer Neg Hx     Past Surgical History:  Procedure Laterality Date  . ABDOMINAL AORTOGRAM W/LOWER EXTREMITY Bilateral 08/21/2019   Procedure: ABDOMINAL AORTOGRAM W/LOWER EXTREMITY;  Surgeon: Waynetta Sandy, MD;  Location: [place] [facility];  Service: Cardiovascular;  Laterality: Bilateral;  . AMPUTATION Right 07/25/2019   Procedure: RIGHT 5th RAY AMPUTATION;  Surgeon:  Newt Minion, MD;  Location: [place];  Service: Orthopedics;  Laterality: Right;  . AMPUTATION Right 11/19/2019   Procedure: RIGHT TRANSMETATARSAL AMPUTATION;  Surgeon: Newt Minion, MD;  Location: [place];  Service: Orthopedics;  Laterality: Right;  . ANGIOPLASTY / STENTING FEMORAL Left 12/11/2013   dr berry  . AV FISTULA PLACEMENT Left 03/19/2014   Procedure: CREATION OF ARTERIOVENOUS (AV) FISTULA  LEFT UPPER ARM;  Surgeon: Mal Misty, MD;  Location: [place];  Service: Vascular;  Laterality: Left;  . BACK SURGERY  01/2018   screws placed   . CARDIAC CATHETERIZATION  2001 and 2010   . COLONOSCOPY W/ BIOPSIES AND POLYPECTOMY    . COLONOSCOPY WITH PROPOFOL N/A 08/01/2016   Procedure: COLONOSCOPY WITH PROPOFOL;  Surgeon: Carol Ada, MD;  [facility];  Service: Endoscopy;  Laterality: N/A;  . CORONARY ARTERY BYPASS GRAFT  2019   [facility] x [facility] bypass  . ESOPHAGOGASTRODUODENOSCOPY (EGD) WITH PROPOFOL N/A 08/01/2016   Procedure: ESOPHAGOGASTRODUODENOSCOPY (EGD) WITH PROPOFOL;  Surgeon: Carol Ada, MD;  [facility];  Service: Endoscopy;  Laterality: N/A;  . FOOT FRACTURE SURGERY Right    ligament repair  . FRACTURE SURGERY     left forearm  . GRAFT APPLICATION Right 0/12/3816   Procedure: FAT GRAFT APPLICATION;  Surgeon: Evelina Bucy, DPM;  Location: [place];  Service: Podiatry;  Laterality: Right;  . INGUINAL HERNIA REPAIR Left   . LEFT HEART CATH AND CORS/GRAFTS ANGIOGRAPHY N/A 04/27/2017   Procedure: LEFT HEART CATH AND CORS/GRAFTS ANGIOGRAPHY;  Surgeon: Leonie Man, MD;  Location: [place] [facility];  Service: Cardiovascular;  Laterality: N/A;  . LEFT HEART CATHETERIZATION WITH CORONARY ANGIOGRAM N/A 06/22/2014   Procedure: LEFT HEART CATHETERIZATION WITH CORONARY ANGIOGRAM;  Surgeon: Troy Sine, MD;  Location: [facility] CATH LAB;  Service: Cardiovascular;  Laterality: N/A;  . LOWER EXTREMITY ANGIOGRAM Left 12/11/2013   Procedure: LOWER EXTREMITY  ANGIOGRAM;  Surgeon: Lorretta Harp, MD;  Location: [facility] CATH LAB;  Service: Cardiovascular;  Laterality: Left;  . LUMBAR LAMINECTOMY/DECOMPRESSION MICRODISCECTOMY Right 07/03/2017   Procedure: MICRODISCECTOMY LUMBAR FIVE - SACRAL ONE RIGHT;  Surgeon: Consuella Lose, MD;  Location: [place];  Service: Neurosurgery;  Laterality: Right;  . LUMBAR LAMINECTOMY/DECOMPRESSION MICRODISCECTOMY Right 10/19/2017   Procedure: MICRODISCECTOMY LUMBAR FIVE- SACRAL 1 ONE ;  Surgeon: Consuella Lose, MD;  Location: [place];  Service: Neurosurgery;  Laterality: Right;  . TONSILLECTOMY AND ADENOIDECTOMY    . WISDOM TOOTH EXTRACTION    . WOUND DEBRIDEMENT Right 05/13/2019   Procedure: DEBRIDEMENT WOUND;  Surgeon: Evelina Bucy, DPM;  Location: [facility];  Service: Podiatry;  Laterality: Right;   Social History   Occupational History  . Occupation: retired  Tobacco Use  . Smoking status: Former Smoker    Packs/day: 1.00    Years: 2.00    Pack years: 2.00    Types: Cigarettes    Quit date: 1976    Years since quitting: 45.7  . Smokeless tobacco: Never Used  . Tobacco comment: quit smoking 40 yrs ago  Vaping Use  . Vaping Use: Never used  Substance and Sexual Activity  . Alcohol use: No    Alcohol/week: 0.0 standard drinks    Comment: h/o social drinking  . Drug use: Not Currently    Types: Marijuana  . Sexual activity: Yes    Birth control/protection: None

## 2020-01-15 NOTE — Telephone Encounter (Signed)
I called and sw April and she states that the pt has a vice compression sock. Advised that he should wear the sock 24/7 and make sure that it makes direct contact with the skin as the sock is impregnated with a nanosilver and this is a dressing and compression garment in one. If there is any drainage he can apply a dry dressing on the outside of the sock to absorb any seepage. Pt will follow up in the office on 01/22/20. Will call with any questions.

## 2020-01-15 NOTE — Telephone Encounter (Signed)
April from [facility] called. She is with the patient. Would like to know how to wrap his wound. Her call back number is [phone]

## 2020-01-22 ENCOUNTER — Ambulatory Visit (INDEPENDENT_AMBULATORY_CARE_PROVIDER_SITE_OTHER): Payer: Medicare Other | Admitting: Physician Assistant

## 2020-01-22 ENCOUNTER — Encounter: Payer: Self-pay | Admitting: Orthopedic Surgery

## 2020-01-22 DIAGNOSIS — M869 Osteomyelitis, unspecified: Secondary | ICD-10-CM

## 2020-01-22 NOTE — Progress Notes (Signed)
Office Visit Note   Patient: Jon Anderson           Date of Birth: 1951/04/01           MRN: [id number] Visit Date: 01/22/2020              Requested by: [name], MD [id number] [place],  [place] [address] PCP: [name], MD  Chief Complaint  Patient presents with  . Right Foot - Routine Post Op      HPI: Patient is a pleasant gentleman who is 2-week follow-up for his right transmetatarsal amputation which has been slow to heal.  He is 2-1/2 months out.  He would also like for me to check his left foot because he has some early callusing.  Been using a nitroglycerin patch cleansing daily and using his compression sock  Assessment & Plan: Visit Diagnoses: No diagnosis found.  Plan: Continue with daily cleansing in sock on the right side.  I have also reiterated the importance of Achilles stretching on both sides.  I discussed with him that this is the cause of his early callusing.  Should do this every day.  I know Dr. Sharol Given has explained this to him before.  Follow-up in 2 weeks.  Follow-Up Instructions: No follow-ups on file.   Ortho Exam  Patient is alert, oriented, no adenopathy, well-dressed, normal affect, normal respiratory effort. Right foot: Lateral side of the incision has thick eschar but no drainage sutures in place minimally tender to palpation.  No surrounding cellulitis or foul odor.  The lateral area has excellent improved blood flow with vascular granulation tissue.  There is a slight amount of drainage.  I did remove 2 loose sutures.  This stimulated some bleeding which was coagulated with a silver nitrate stick. Left foot dorsalis pedis pulses easily palpable.  He has mild soft tissue swelling.  No cellulitis no erythema.  He has very early callus formation under the first metatarsal head and the fifth metatarsal head.  Not enough to debride at this time.  Discussed making sure is feet are well moisturized.  And taught him and reviewed Achilles  stretching  Imaging: No results found. No images are attached to the encounter.  Labs: Lab Results  Component Value Date   HGBA1C 6.4 (H) 07/24/2019   HGBA1C 6.5 (H) 07/18/2018   HGBA1C 6.4 (H) 10/16/2017   ESRSEDRATE 22 (H) 07/24/2019   CRP <0.5 07/24/2019   REPTSTATUS 07/29/2019 FINAL 07/24/2019   CULT  07/24/2019    NO GROWTH 5 DAYS Performed at [facility], [place] [address] [place], [place] [address]      Lab Results  Component Value Date   ALBUMIN 3.1 (L) 12/01/2019   ALBUMIN 3.1 (L) 11/28/2019   ALBUMIN 3.1 (L) 11/26/2019   PREALBUMIN 28.0 07/24/2019    Lab Results  Component Value Date   MG 1.8 04/26/2019   MG 1.8 04/24/2019   MG 2.1 03/22/2017   No results found for: VD25OH  Lab Results  Component Value Date   PREALBUMIN 28.0 07/24/2019   CBC EXTENDED Latest Ref Rng & Units 12/01/2019 11/28/2019 11/26/2019  WBC 4.0 - 10.5 K/uL 8.3 9.2 9.1  RBC 4.22 - 5.81 MIL/uL 3.65(L) 3.74(L) 3.92(L)  HGB 13.0 - 17.0 g/dL 10.7(L) 11.2(L) 12.0(L)  HCT 39 - 52 % 33.9(L) 35.3(L) 36.7(L)  PLT 150 - 400 K/uL 329 254 189  NEUTROABS 1.7 - 7.7 K/uL - - -  LYMPHSABS 0.7 - 4.0 K/uL - - -  There is no height or weight on file to calculate BMI.  Orders:  No orders of the defined types were placed in this encounter.  No orders of the defined types were placed in this encounter.    Procedures: No procedures performed  Clinical Data: No additional findings.  ROS:  All other systems negative, except as noted in the HPI. Review of Systems  Objective: Vital Signs: There were no vitals taken for this visit.  Specialty Comments:  No specialty comments available.  PMFS History: Patient Active Problem List   Diagnosis Date Noted  . Right below-knee amputee ([place]) 11/25/2019  . S/P transmetatarsal amputation of foot, right ([place]) 11/25/2019  . Gangrene of right foot ([place]) 11/19/2019  . History of partial ray amputation of fifth toe of right foot ([place])  09/09/2019  . Ischemic ulcer diabetic foot ([place]) 09/09/2019  . Subacute osteomyelitis, right ankle and foot ([place])   . Diabetic foot infection ([place]) 07/24/2019  . Chronic diastolic CHF (congestive heart failure) ([place]) 07/24/2019  . Diabetic ulcer of right midfoot associated with diabetes mellitus due to underlying condition, with fat layer exposed ([place])   . Gastroenteritis 04/24/2019  . Allergy, unspecified, initial encounter 01/27/2019  . DM neuropathy with neurologic complication ([place]) 32/67/1245  . GERD (gastroesophageal reflux disease) 02/28/2018  . Nephrolithiasis 02/28/2018  . Sciatic leg pain 02/28/2018  . Snores 02/28/2018  . Orthopnea 02/23/2018  . Elevated troponin 02/23/2018  . HNP (herniated nucleus pulposus), lumbar 07/03/2017  . Encounter for removal of sutures 06/01/2017  . Chest pain in adult 04/27/2017  . Restless leg syndrome, uncontrolled 04/27/2017  . 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 Anemia of chronic disease 03/17/2017  . ESRD (end stage renal disease) on dialysis ([place]) 03/17/2017  . Chronic chest pain 03/17/2017  . Type II diabetes mellitus ([place]) 03/17/2017  . Acute on chronic diastolic heart failure ([place]) 03/17/2017  . Acute heart failure ([place]) 03/17/2017  . Lumbar radiculopathy 01/16/2017  . Pre-transplant evaluation for kidney transplant 06/15/2016  . Pain, unspecified 04/17/2016  . Increased frequency of urination 12/17/2015  . Nocturia 12/17/2015  . Chest pain, non-cardiac 10/09/2015  . Hypercalcemia 05/10/2015  . Other fluid overload 02/24/2015  . 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 Fatty (change of) liver, not elsewhere classified 07/02/2014  . Aftercare including intermittent dialysis ([place]) 06/24/2014  . Diarrhea, unspecified 06/24/2014  . Fever, unspecified 06/24/2014  . Iron deficiency anemia, unspecified 06/24/2014  . Other specified coagulation defects ([place]) 06/24/2014  . Pruritus,  unspecified 06/24/2014  . Secondary hyperparathyroidism of renal origin ([place]) 06/24/2014  . Type 2 diabetes mellitus with diabetic peripheral angiopathy without gangrene ([place]) 06/24/2014  . Acute on chronic renal failure ([place]) 06/16/2014  . Shoulder pain, left 12/15/2013  . Chest pain 12/15/2013  . Claudication ([place]) 12/11/2013  . PVD (peripheral vascular disease) ([place]) 12/11/2013  . Carotid artery disease ([place]) 09/30/2013  . Acute chest pain 11/15/2012  . Bruit 09/15/2010  . CAD (coronary artery disease) nonobstructive per cath 2012   . Hyperlipidemia LDL goal <70 07/22/2009  . Essential hypertension 07/22/2009   Past Medical History:  Diagnosis Date  . Anemia of chronic disease   . CAD (coronary artery disease)    a.  Myoview 4/11: EF 53%, no scar or ischemia   c. MV 2012 Nl perfusion, apical thinning.  No ischemia or scar.  EF 49%, appears greater by visual estimate.;  d.  Dob stress echo 12/13:  Negative  Dob stress echo. There is no evidence of ischemia.  The LVF is normal. b. Normal cors 2016.  . Carotid stenosis    a. <74% RICA, >25% LICA by duplex 12/5636  . Chronic chest pain    occ  . Constipation    chronic  . Dyspnea    with extertion  . ESRD (end stage renal disease) on dialysis [facility])    M-W-F- [name]  . GERD (gastroesophageal reflux disease)   . History of kidney stones    Passed  . HNP (herniated nucleus pulposus), lumbar   . HTN (hypertension)    echo 3/10: EF 60%, LAE  . Hyperlipidemia   . Nephrolithiasis    "passed them all"  . Peripheral arterial disease ([place])    a. s/p PTCA  right   . Pneumonia yrs ago  . Restless legs   . Sciatic leg pain   . Snores    a. presumed OSA, pt has refused sleep eval in past.  . Type II diabetes mellitus ([place])    no longer on medications, checks blood glucose at home  . Urinary frequency     Family History  Problem Relation Age of Onset  . Heart attack Sister        died @ 71  . Cancer Mother        died @ 56;  unknown type  . Diabetes Brother        deceased  . Cirrhosis Father        alcohol related  . Diabetes Father   . Esophageal cancer Neg Hx   . Colon cancer Neg Hx   . Pancreatic cancer Neg Hx   . Stomach cancer Neg Hx     Past Surgical History:  Procedure Laterality Date  . ABDOMINAL AORTOGRAM W/LOWER EXTREMITY Bilateral 08/21/2019   Procedure: ABDOMINAL AORTOGRAM W/LOWER EXTREMITY;  Surgeon: Waynetta Sandy, MD;  Location: [place] [facility];  Service: Cardiovascular;  Laterality: Bilateral;  . AMPUTATION Right 07/25/2019   Procedure: RIGHT 5th RAY AMPUTATION;  Surgeon: Newt Minion, MD;  Location: [place];  Service: Orthopedics;  Laterality: Right;  . AMPUTATION Right 11/19/2019   Procedure: RIGHT TRANSMETATARSAL AMPUTATION;  Surgeon: Newt Minion, MD;  Location: [place];  Service: Orthopedics;  Laterality: Right;  . ANGIOPLASTY / STENTING FEMORAL Left 12/11/2013   dr berry  . AV FISTULA PLACEMENT Left 03/19/2014   Procedure: CREATION OF ARTERIOVENOUS (AV) FISTULA  LEFT UPPER ARM;  Surgeon: Mal Misty, MD;  Location: [place];  Service: Vascular;  Laterality: Left;  . BACK SURGERY  01/2018   screws placed   . CARDIAC CATHETERIZATION  2001 and 2010   . COLONOSCOPY W/ BIOPSIES AND POLYPECTOMY    . COLONOSCOPY WITH PROPOFOL N/A 08/01/2016   Procedure: COLONOSCOPY WITH PROPOFOL;  Surgeon: Carol Ada, MD;  Location: [facility];  Service: Endoscopy;  Laterality: N/A;  . CORONARY ARTERY BYPASS GRAFT  2019   [facility] x [facility] bypass  . ESOPHAGOGASTRODUODENOSCOPY (EGD) WITH PROPOFOL N/A 08/01/2016   Procedure: ESOPHAGOGASTRODUODENOSCOPY (EGD) WITH PROPOFOL;  Surgeon: Carol Ada, MD;  [facility];  Service: Endoscopy;  Laterality: N/A;  . FOOT FRACTURE SURGERY Right    ligament repair  . FRACTURE SURGERY     left forearm  . GRAFT APPLICATION Right 10/13/6431   Procedure: FAT GRAFT APPLICATION;  Surgeon: Evelina Bucy, DPM;  Location: [place];   Service: Podiatry;  Laterality: Right;  . INGUINAL HERNIA REPAIR Left   .  LEFT HEART CATH AND CORS/GRAFTS ANGIOGRAPHY N/A 04/27/2017   Procedure: LEFT HEART CATH AND CORS/GRAFTS ANGIOGRAPHY;  Surgeon: Leonie Man, MD;  Location: [place] [facility];  Service: Cardiovascular;  Laterality: N/A;  . LEFT HEART CATHETERIZATION WITH CORONARY ANGIOGRAM N/A 06/22/2014   Procedure: LEFT HEART CATHETERIZATION WITH CORONARY ANGIOGRAM;  Surgeon: Troy Sine, MD;  Location: [facility] C[facility] LAB;  Service: Cardiovascular;  Laterality: N/A;  . LOWER EXTREMITY ANGIOGRAM Left 12/11/2013   Procedure: LOWER EXTREMITY ANGIOGRAM;  Surgeon: Lorretta Harp, MD;  Location: [facility] C[facility] LAB;  Service: Cardiovascular;  Laterality: Left;  . LUMBAR LAMINECTOMY/DECOMPRESSION MICRODISCECTOMY Right 07/03/2017   Procedure: MICRODISCECTOMY LUMBAR FIVE - SACRAL ONE RIGHT;  Surgeon: Consuella Lose, MD;  Location: [place];  Service: Neurosurgery;  Laterality: Right;  . LUMBAR LAMINECTOMY/DECOMPRESSION MICRODISCECTOMY Right 10/19/2017   Procedure: MICRODISCECTOMY LUMBAR FIVE- SACRAL 1 ONE ;  Surgeon: Consuella Lose, MD;  Location: [place];  Service: Neurosurgery;  Laterality: Right;  . TONSILLECTOMY AND ADENOIDECTOMY    . WISDOM TOOTH EXTRACTION    . WOUND DEBRIDEMENT Right 05/13/2019   Procedure: DEBRIDEMENT WOUND;  Surgeon: Evelina Bucy, DPM;  Location: [facility];  Service: Podiatry;  Laterality: Right;   Social History   Occupational History  . Occupation: retired  Tobacco Use  . Smoking status: Former Smoker    Packs/day: 1.00    Years: 2.00    Pack years: 2.00    Types: Cigarettes    Quit date: 1976    Years since quitting: 45.8  . Smokeless tobacco: Never Used  . Tobacco comment: quit smoking 40 yrs ago  Vaping Use  . Vaping Use: Never used  Substance and Sexual Activity  . Alcohol use: No    Alcohol/week: 0.0 standard drinks    Comment: h/o social drinking  . Drug use: Not Currently    Types:  Marijuana  . Sexual activity: Yes    Birth control/protection: None

## 2020-01-29 ENCOUNTER — Ambulatory Visit (INDEPENDENT_AMBULATORY_CARE_PROVIDER_SITE_OTHER): Payer: Medicare Other | Admitting: Physician Assistant

## 2020-01-29 ENCOUNTER — Other Ambulatory Visit: Payer: Self-pay

## 2020-01-29 VITALS — BP 98/52 | HR 74 | Temp 98.5°F | Resp 20 | Ht 71.0 in | Wt 160.0 lb

## 2020-01-29 DIAGNOSIS — Z992 Dependence on renal dialysis: Secondary | ICD-10-CM

## 2020-01-29 DIAGNOSIS — N186 End stage renal disease: Secondary | ICD-10-CM

## 2020-01-29 NOTE — Progress Notes (Signed)
[facility] [place] HISTORY AND PHYSICAL   History of Present Illness:  Patient is a 69 y.o. 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  The fistula was created on 2015.  He states he has had episodes of bleeding and scab formation over the past several months.    Past medical history includes: HTN, CAD s/p cardiac bypass 2018, ESRD, carotid stenosis, PAD with non healing ulcer followed by Dr. Sharol Given.  He takes Plavix, ASA and Statin daily.    Past Medical History:  Diagnosis Date  . Anemia of chronic disease   . CAD (coronary artery disease)    a.  Myoview 4/11: EF 53%, no scar or ischemia   c. MV 2012 Nl perfusion, apical thinning.  No ischemia or scar.  EF 49%, appears greater by visual estimate.;  d.  Dob stress echo 12/13:  Negative Dob stress echo. There is no evidence of ischemia.  The LVF is normal. b. Normal cors 2016.  . Carotid stenosis    a. <25% RICA, >36% LICA by duplex 09/4401  . Chronic chest pain    occ  . Constipation    chronic  . Dyspnea    with extertion  . ESRD (end stage renal disease) on dialysis [facility])    M-W-F- [name]  . GERD (gastroesophageal reflux disease)   . History of kidney stones    Passed  . HNP (herniated nucleus pulposus), lumbar   . HTN (hypertension)    echo 3/10: EF 60%, LAE  . Hyperlipidemia   . Nephrolithiasis    "passed them all"  . Peripheral arterial disease ([place])    a. s/p PTCA  right   . Pneumonia yrs ago  . Restless legs   . Sciatic leg pain   . Snores    a. presumed OSA, pt has refused sleep eval in past.  . Type II diabetes mellitus ([place])    no longer on medications, checks blood glucose at home  . Urinary frequency     Past Surgical History:  Procedure Laterality Date  . ABDOMINAL AORTOGRAM W/LOWER EXTREMITY Bilateral 08/21/2019   Procedure: ABDOMINAL AORTOGRAM W/LOWER EXTREMITY;  Surgeon: Waynetta Sandy, MD;  Location: [place] [facility];   Service: Cardiovascular;  Laterality: Bilateral;  . AMPUTATION Right 07/25/2019   Procedure: RIGHT 5th RAY AMPUTATION;  Surgeon: Newt Minion, MD;  Location: [place];  Service: Orthopedics;  Laterality: Right;  . AMPUTATION Right 11/19/2019   Procedure: RIGHT TRANSMETATARSAL AMPUTATION;  Surgeon: Newt Minion, MD;  Location: [place];  Service: Orthopedics;  Laterality: Right;  . ANGIOPLASTY / STENTING FEMORAL Left 12/11/2013   dr berry  . AV FISTULA PLACEMENT Left 03/19/2014   Procedure: CREATION OF ARTERIOVENOUS (AV) FISTULA  LEFT UPPER ARM;  Surgeon: Mal Misty, MD;  Location: [place];  Service: Vascular;  Laterality: Left;  . BACK SURGERY  01/2018   screws placed   . CARDIAC CATHETERIZATION  2001 and 2010   . COLONOSCOPY W/ BIOPSIES AND POLYPECTOMY    . COLONOSCOPY WITH PROPOFOL N/A 08/01/2016   Procedure: COLONOSCOPY WITH PROPOFOL;  Surgeon: Carol Ada, MD;  [facility];  Service: Endoscopy;  Laterality: N/A;  . CORONARY ARTERY BYPASS GRAFT  2019   [facility] x [facility] bypass  . ESOPHAGOGASTRODUODENOSCOPY (EGD) WITH PROPOFOL N/A 08/01/2016   Procedure: ESOPHAGOGASTRODUODENOSCOPY (EGD) WITH PROPOFOL;  Surgeon: Carol Ada, MD;  [facility];  Service: Endoscopy;  Laterality: N/A;  .  FOOT FRACTURE SURGERY Right    ligament repair  . FRACTURE SURGERY     left forearm  . GRAFT APPLICATION Right 04/15/9448   Procedure: FAT GRAFT APPLICATION;  Surgeon: Evelina Bucy, DPM;  Location: [place];  Service: Podiatry;  Laterality: Right;  . INGUINAL HERNIA REPAIR Left   . LEFT HEART CATH AND CORS/GRAFTS ANGIOGRAPHY N/A 04/27/2017   Procedure: LEFT HEART CATH AND CORS/GRAFTS ANGIOGRAPHY;  Surgeon: Leonie Man, MD;  Location: [place] [facility];  Service: Cardiovascular;  Laterality: N/A;  . LEFT HEART CATHETERIZATION WITH CORONARY ANGIOGRAM N/A 06/22/2014   Procedure: LEFT HEART CATHETERIZATION WITH CORONARY ANGIOGRAM;  Surgeon: Troy Sine, MD;  Location: [facility]  CATH LAB;  Service: Cardiovascular;  Laterality: N/A;  . LOWER EXTREMITY ANGIOGRAM Left 12/11/2013   Procedure: LOWER EXTREMITY ANGIOGRAM;  Surgeon: Lorretta Harp, MD;  Location: [facility] CATH LAB;  Service: Cardiovascular;  Laterality: Left;  . LUMBAR LAMINECTOMY/DECOMPRESSION MICRODISCECTOMY Right 07/03/2017   Procedure: MICRODISCECTOMY LUMBAR FIVE - SACRAL ONE RIGHT;  Surgeon: Consuella Lose, MD;  Location: [place];  Service: Neurosurgery;  Laterality: Right;  . LUMBAR LAMINECTOMY/DECOMPRESSION MICRODISCECTOMY Right 10/19/2017   Procedure: MICRODISCECTOMY LUMBAR FIVE- SACRAL 1 ONE ;  Surgeon: Consuella Lose, MD;  Location: [place];  Service: Neurosurgery;  Laterality: Right;  . TONSILLECTOMY AND ADENOIDECTOMY    . WISDOM TOOTH EXTRACTION    . WOUND DEBRIDEMENT Right 05/13/2019   Procedure: DEBRIDEMENT WOUND;  Surgeon: Evelina Bucy, DPM;  Location[facility];  Service: Podiatry;  Laterality: Right;     Social History Social History   Tobacco Use  . Smoking status: Former Smoker    Packs/day: 1.00    Years: 2.00    Pack years: 2.00    Types: Cigarettes    Quit date: 1976    Years since quitting: 45.8  . Smokeless tobacco: Never Used  . Tobacco comment: quit smoking 40 yrs ago  Vaping Use  . Vaping Use: Never used  Substance Use Topics  . Alcohol use: No    Alcohol/week: 0.0 standard drinks    Comment: h/o social drinking  . Drug use: Not Currently    Types: Marijuana    Family History Family History  Problem Relation Age of Onset  . Heart attack Sister        died @ 7  . Cancer Mother        died @ 41; unknown type  . Diabetes Brother        deceased  . Cirrhosis Father        alcohol related  . Diabetes Father   . Esophageal cancer Neg Hx   . Colon cancer Neg Hx   . Pancreatic cancer Neg Hx   . Stomach cancer Neg Hx     Allergies  Allergies  Allergen Reactions  . Kiwi Extract Itching, Swelling and Other (See Comments)    Lips and face  swell- breathing not affected  . Flexeril [Cyclobenzaprine]     Hands become flimsy, can not hold things  . Tape Other (See Comments)    "Plastic" tape causes blisters!!     Current Outpatient Medications  Medication Sig Dispense Refill  . acetaminophen (TYLENOL) 325 MG tablet Take 1-2 tablets (325-650 mg total) by mouth every 6 (six) hours as needed for mild pain (pain score 1-3 or temp > 100.5).    Marland Kitchen aspirin EC 81 MG tablet Take 1 tablet (81 mg total) by mouth daily.    Marland Kitchen  atorvastatin (LIPITOR) 40 MG tablet Take 1 tablet (40 mg total) by mouth daily at 6 PM. 30 tablet 5  . calcitRIOL (ROCALTROL) 0.25 MCG capsule Take 5 capsules (1.25 mcg total) by mouth every Monday, Wednesday, and Friday with hemodialysis. 60 capsule 0  . calcium acetate (PHOSLO) 667 MG capsule Take 2 capsules (1,334 mg total) by mouth 2 (two) times daily with a meal. 180 capsule 0  . clopidogrel (PLAVIX) 75 MG tablet Take 1 tablet (75 mg total) by mouth daily. 30 tablet 0  . doxycycline (VIBRA-TABS) 100 MG tablet TAKE 1 TABLET BY MOUTH TWICE A DAY 60 tablet 0  . gabapentin (NEURONTIN) 100 MG capsule Take 1 capsule (100 mg total) by mouth 2 (two) times daily. 60 capsule 0  . lipase/protease/amylase (CREON) 36000 UNITS CPEP capsule Take 1 capsule (36,000 Units total) by mouth daily. 180 capsule 0  . loperamide (IMODIUM) 2 MG capsule Take 2 mg by mouth as needed for diarrhea or loose stools.    . midodrine (PROAMATINE) 5 MG tablet Take 1 tablet (5 mg total) by mouth every Monday, Wednesday, and Friday. Take with Dialysis 60 tablet 0  . multivitamin (RENA-VIT) TABS tablet Take 1 tablet by mouth at bedtime. 30 tablet 0  . Naldemedine Tosylate (SYMPROIC PO) Take 1 tablet by mouth daily as needed.    . nitroGLYCERIN (NITRODUR - DOSED IN MG/24 HR) 0.2 mg/hr patch Place 1 patch (0.2 mg total) onto the skin daily. Place on different areas of right forefoot daily 30 patch 12  . nitroGLYCERIN (NITROSTAT) 0.4 MG SL tablet Place 1  tablet (0.4 mg total) under the tongue every 5 (five) minutes as needed for chest pain. 25 tablet 3  . oxyCODONE (OXY IR/ROXICODONE) 5 MG immediate release tablet Take 1-2 tablets (5-10 mg total) by mouth every 6 (six) hours as needed for moderate pain (pain score 4-6). 20 tablet 0  . pantoprazole (PROTONIX) 40 MG tablet Take 1 tablet (40 mg total) by mouth daily. 30 tablet 0  . pentoxifylline (TRENTAL) 400 MG CR tablet Take 1 tablet (400 mg total) by mouth daily. 90 tablet 3  . polyethylene glycol (MIRALAX / GLYCOLAX) packet Take 17 g by mouth daily as needed for moderate constipation. 14 each 0  . traMADol (ULTRAM) 50 MG tablet Take 1 tablet (50 mg total) by mouth every 6 (six) hours as needed for moderate pain. 30 tablet 0   No current facility-administered medications for this visit.    ROS:   General:  No weight loss, Fever, chills  HEENT: No recent headaches, no nasal bleeding, no visual changes, no sore throat  Neurologic: No dizziness, blackouts, seizures. No recent symptoms of stroke or mini- stroke. No recent episodes of slurred speech, or temporary blindness.  Cardiac: No recent episodes of chest pain/pressure, no shortness of breath at rest.  No shortness of breath with exertion.  Denies history of atrial fibrillation or irregular heartbeat  Vascular: No history of rest pain in feet.  No history of claudication.  Positive history of non-healing ulcer, No history of DVT   Pulmonary: No home oxygen, no productive cough, no hemoptysis,  No asthma or wheezing  Musculoskeletal:  [ ]  Arthritis, [ ]  Low back pain,  [ ]  Joint pain  Hematologic:No history of hypercoagulable state.  No history of easy bleeding.  positive history of anemia  Gastrointestinal: No hematochezia or melena,  No gastroesophageal reflux, no trouble swallowing  Urinary: [ ]  chronic Kidney disease, [x ] on HD - [  x ] MWF or [ ]  TTHS, [ ]  Burning with urination, [ ]  Frequent urination, [ ]  Difficulty urinating;    Skin: No rashes  Psychological: No history of anxiety,  No history of depression   Physical Examination  Vitals:   01/29/20 1006  BP: (!) 98/52  Pulse: 74  Resp: 20  Temp: 98.5 F (36.9 C)  TempSrc: Temporal  SpO2: 98%  Weight: 160 lb (72.6 kg)  Height: 5\' 11"  (1.803 m)    Body mass index is 22.32 kg/m.  General:  Alert and oriented, no acute distress HEENT: Normal Neck: No bruit or JVD Pulmonary: Clear to auscultation bilaterally Cardiac: Regular Rate and Rhythm without murmur Gastrointestinal: Soft, non-tender, non-distended, no mass, no scars Skin: No rash Extremity Pulses: palpable fistula thrill, weak grip B UE 4+/5.    Musculoskeletal: No deformity or edema  Neurologic: Upper and lower extremity motor 5/5 and symmetric   ASSESSMENT:  ESRD with thinning skin over aneurysmal areas of left AV fistula. He reports episodes of prolonged bleeding and scab formation in the past several months.  The skin over the aneurysm is very thin with minimal mobility.    PLAN:  Plication of one aneurysm followed by the other on another date.  He would prefer not to have a catheter placed if possible.  He is on HD MWF.  He takes ASA and Plavix daily.    Roxy Horseman PA-C [facility] [place] Office: [phone]  MD in clinic [place]

## 2020-01-29 NOTE — H&P (View-Only) (Signed)
[facility] [place] HISTORY AND PHYSICAL   History of Present Illness:  Patient is a 69 y.o. 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  The fistula was created on 2015.  He states he has had episodes of bleeding and scab formation over the past several months.    Past medical history includes: HTN, CAD s/p cardiac bypass 2018, ESRD, carotid stenosis, PAD with non healing ulcer followed by Dr. Sharol Given.  He takes Plavix, ASA and Statin daily.    Past Medical History:  Diagnosis Date  . Anemia of chronic disease   . CAD (coronary artery disease)    a.  Myoview 4/11: EF 53%, no scar or ischemia   c. MV 2012 Nl perfusion, apical thinning.  No ischemia or scar.  EF 49%, appears greater by visual estimate.;  d.  Dob stress echo 12/13:  Negative Dob stress echo. There is no evidence of ischemia.  The LVF is normal. b. Normal cors 2016.  . Carotid stenosis    a. <27% RICA, >06% LICA by duplex 05/3760  . Chronic chest pain    occ  . Constipation    chronic  . Dyspnea    with extertion  . ESRD (end stage renal disease) on dialysis [facility])    M-W-F- [name]  . GERD (gastroesophageal reflux disease)   . History of kidney stones    Passed  . HNP (herniated nucleus pulposus), lumbar   . HTN (hypertension)    echo 3/10: EF 60%, LAE  . Hyperlipidemia   . Nephrolithiasis    "passed them all"  . Peripheral arterial disease ([place])    a. s/p PTCA  right   . Pneumonia yrs ago  . Restless legs   . Sciatic leg pain   . Snores    a. presumed OSA, pt has refused sleep eval in past.  . Type II diabetes mellitus ([place])    no longer on medications, checks blood glucose at home  . Urinary frequency     Past Surgical History:  Procedure Laterality Date  . ABDOMINAL AORTOGRAM W/LOWER EXTREMITY Bilateral 08/21/2019   Procedure: ABDOMINAL AORTOGRAM W/LOWER EXTREMITY;  Surgeon: Waynetta Sandy, MD;  Location: [place] CV LAB;   Service: Cardiovascular;  Laterality: Bilateral;  . AMPUTATION Right 07/25/2019   Procedure: RIGHT 5th RAY AMPUTATION;  Surgeon: Newt Minion, MD;  Location: [place];  Service: Orthopedics;  Laterality: Right;  . AMPUTATION Right 11/19/2019   Procedure: RIGHT TRANSMETATARSAL AMPUTATION;  Surgeon: Newt Minion, MD;  Location: [place];  Service: Orthopedics;  Laterality: Right;  . ANGIOPLASTY / STENTING FEMORAL Left 12/11/2013   dr berry  . AV FISTULA PLACEMENT Left 03/19/2014   Procedure: CREATION OF ARTERIOVENOUS (AV) FISTULA  LEFT UPPER ARM;  Surgeon: Mal Misty, MD;  Location: [place];  Service: Vascular;  Laterality: Left;  . BACK SURGERY  01/2018   screws placed   . CARDIAC CATHETERIZATION  2001 and 2010   . COLONOSCOPY W/ BIOPSIES AND POLYPECTOMY    . COLONOSCOPY WITH PROPOFOL N/A 08/01/2016   Procedure: COLONOSCOPY WITH PROPOFOL;  Surgeon: Carol Ada, MD;  [facility];  Service: Endoscopy;  Laterality: N/A;  . CORONARY ARTERY BYPASS GRAFT  2019   [facility] x [facility] bypass  . ESOPHAGOGASTRODUODENOSCOPY (EGD) WITH PROPOFOL N/A 08/01/2016   Procedure: ESOPHAGOGASTRODUODENOSCOPY (EGD) WITH PROPOFOL;  Surgeon: Carol Ada, MD;  [facility];  Service: Endoscopy;  Laterality: N/A;  .  FOOT FRACTURE SURGERY Right    ligament repair  . FRACTURE SURGERY     left forearm  . GRAFT APPLICATION Right 11/16/3808   Procedure: FAT GRAFT APPLICATION;  Surgeon: Evelina Bucy, DPM;  Location: [place];  Service: Podiatry;  Laterality: Right;  . INGUINAL HERNIA REPAIR Left   . LEFT HEART CATH AND CORS/GRAFTS ANGIOGRAPHY N/A 04/27/2017   Procedure: LEFT HEART CATH AND CORS/GRAFTS ANGIOGRAPHY;  Surgeon: Leonie Man, MD;  Location: [place] [facility];  Service: Cardiovascular;  Laterality: N/A;  . LEFT HEART CATHETERIZATION WITH CORONARY ANGIOGRAM N/A 06/22/2014   Procedure: LEFT HEART CATHETERIZATION WITH CORONARY ANGIOGRAM;  Surgeon: Troy Sine, MD;  Location: [facility]  CATH LAB;  Service: Cardiovascular;  Laterality: N/A;  . LOWER EXTREMITY ANGIOGRAM Left 12/11/2013   Procedure: LOWER EXTREMITY ANGIOGRAM;  Surgeon: Lorretta Harp, MD;  Location: [facility] CATH LAB;  Service: Cardiovascular;  Laterality: Left;  . LUMBAR LAMINECTOMY/DECOMPRESSION MICRODISCECTOMY Right 07/03/2017   Procedure: MICRODISCECTOMY LUMBAR FIVE - SACRAL ONE RIGHT;  Surgeon: Consuella Lose, MD;  Location: [place];  Service: Neurosurgery;  Laterality: Right;  . LUMBAR LAMINECTOMY/DECOMPRESSION MICRODISCECTOMY Right 10/19/2017   Procedure: MICRODISCECTOMY LUMBAR FIVE- SACRAL 1 ONE ;  Surgeon: Consuella Lose, MD;  Location: [place];  Service: Neurosurgery;  Laterality: Right;  . TONSILLECTOMY AND ADENOIDECTOMY    . WISDOM TOOTH EXTRACTION    . WOUND DEBRIDEMENT Right 05/13/2019   Procedure: DEBRIDEMENT WOUND;  Surgeon: Evelina Bucy, DPM;  Location: [facility];  Service: Podiatry;  Laterality: Right;     Social History Social History   Tobacco Use  . Smoking status: Former Smoker    Packs/day: 1.00    Years: 2.00    Pack years: 2.00    Types: Cigarettes    Quit date: 1976    Years since quitting: 45.8  . Smokeless tobacco: Never Used  . Tobacco comment: quit smoking 40 yrs ago  Vaping Use  . Vaping Use: Never used  Substance Use Topics  . Alcohol use: No    Alcohol/week: 0.0 standard drinks    Comment: h/o social drinking  . Drug use: Not Currently    Types: Marijuana    Family History Family History  Problem Relation Age of Onset  . Heart attack Sister        died @ 41  . Cancer Mother        died @ 71; unknown type  . Diabetes Brother        deceased  . Cirrhosis Father        alcohol related  . Diabetes Father   . Esophageal cancer Neg Hx   . Colon cancer Neg Hx   . Pancreatic cancer Neg Hx   . Stomach cancer Neg Hx     Allergies  Allergies  Allergen Reactions  . Kiwi Extract Itching, Swelling and Other (See Comments)    Lips and face  swell- breathing not affected  . Flexeril [Cyclobenzaprine]     Hands become flimsy, can not hold things  . Tape Other (See Comments)    "Plastic" tape causes blisters!!     Current Outpatient Medications  Medication Sig Dispense Refill  . acetaminophen (TYLENOL) 325 MG tablet Take 1-2 tablets (325-650 mg total) by mouth every 6 (six) hours as needed for mild pain (pain score 1-3 or temp > 100.5).    Marland Kitchen aspirin EC 81 MG tablet Take 1 tablet (81 mg total) by mouth daily.    Marland Kitchen  atorvastatin (LIPITOR) 40 MG tablet Take 1 tablet (40 mg total) by mouth daily at 6 PM. 30 tablet 5  . calcitRIOL (ROCALTROL) 0.25 MCG capsule Take 5 capsules (1.25 mcg total) by mouth every Monday, Wednesday, and Friday with hemodialysis. 60 capsule 0  . calcium acetate (PHOSLO) 667 MG capsule Take 2 capsules (1,334 mg total) by mouth 2 (two) times daily with a meal. 180 capsule 0  . clopidogrel (PLAVIX) 75 MG tablet Take 1 tablet (75 mg total) by mouth daily. 30 tablet 0  . doxycycline (VIBRA-TABS) 100 MG tablet TAKE 1 TABLET BY MOUTH TWICE A DAY 60 tablet 0  . gabapentin (NEURONTIN) 100 MG capsule Take 1 capsule (100 mg total) by mouth 2 (two) times daily. 60 capsule 0  . lipase/protease/amylase (CREON) 36000 UNITS CPEP capsule Take 1 capsule (36,000 Units total) by mouth daily. 180 capsule 0  . loperamide (IMODIUM) 2 MG capsule Take 2 mg by mouth as needed for diarrhea or loose stools.    . midodrine (PROAMATINE) 5 MG tablet Take 1 tablet (5 mg total) by mouth every Monday, Wednesday, and Friday. Take with Dialysis 60 tablet 0  . multivitamin (RENA-VIT) TABS tablet Take 1 tablet by mouth at bedtime. 30 tablet 0  . Naldemedine Tosylate (SYMPROIC PO) Take 1 tablet by mouth daily as needed.    . nitroGLYCERIN (NITRODUR - DOSED IN MG/24 HR) 0.2 mg/hr patch Place 1 patch (0.2 mg total) onto the skin daily. Place on different areas of right forefoot daily 30 patch 12  . nitroGLYCERIN (NITROSTAT) 0.4 MG SL tablet Place 1  tablet (0.4 mg total) under the tongue every 5 (five) minutes as needed for chest pain. 25 tablet 3  . oxyCODONE (OXY IR/ROXICODONE) 5 MG immediate release tablet Take 1-2 tablets (5-10 mg total) by mouth every 6 (six) hours as needed for moderate pain (pain score 4-6). 20 tablet 0  . pantoprazole (PROTONIX) 40 MG tablet Take 1 tablet (40 mg total) by mouth daily. 30 tablet 0  . pentoxifylline (TRENTAL) 400 MG CR tablet Take 1 tablet (400 mg total) by mouth daily. 90 tablet 3  . polyethylene glycol (MIRALAX / GLYCOLAX) packet Take 17 g by mouth daily as needed for moderate constipation. 14 each 0  . traMADol (ULTRAM) 50 MG tablet Take 1 tablet (50 mg total) by mouth every 6 (six) hours as needed for moderate pain. 30 tablet 0   No current facility-administered medications for this visit.    ROS:   General:  No weight loss, Fever, chills  HEENT: No recent headaches, no nasal bleeding, no visual changes, no sore throat  Neurologic: No dizziness, blackouts, seizures. No recent symptoms of stroke or mini- stroke. No recent episodes of slurred speech, or temporary blindness.  Cardiac: No recent episodes of chest pain/pressure, no shortness of breath at rest.  No shortness of breath with exertion.  Denies history of atrial fibrillation or irregular heartbeat  Vascular: No history of rest pain in feet.  No history of claudication.  Positive history of non-healing ulcer, No history of DVT   Pulmonary: No home oxygen, no productive cough, no hemoptysis,  No asthma or wheezing  Musculoskeletal:  [ ]  Arthritis, [ ]  Low back pain,  [ ]  Joint pain  Hematologic:No history of hypercoagulable state.  No history of easy bleeding.  positive history of anemia  Gastrointestinal: No hematochezia or melena,  No gastroesophageal reflux, no trouble swallowing  Urinary: [ ]  chronic Kidney disease, [x ] on HD - [  x ] MWF or [ ]  TTHS, [ ]  Burning with urination, [ ]  Frequent urination, [ ]  Difficulty urinating;    Skin: No rashes  Psychological: No history of anxiety,  No history of depression   Physical Examination  Vitals:   01/29/20 1006  BP: (!) 98/52  Pulse: 74  Resp: 20  Temp: 98.5 F (36.9 C)  TempSrc: Temporal  SpO2: 98%  Weight: 160 lb (72.6 kg)  Height: 5\' 11"  (1.803 m)    Body mass index is 22.32 kg/m.  General:  Alert and oriented, no acute distress HEENT: Normal Neck: No bruit or JVD Pulmonary: Clear to auscultation bilaterally Cardiac: Regular Rate and Rhythm without murmur Gastrointestinal: Soft, non-tender, non-distended, no mass, no scars Skin: No rash Extremity Pulses: palpable fistula thrill, weak grip B UE 4+/5.    Musculoskeletal: No deformity or edema  Neurologic: Upper and lower extremity motor 5/5 and symmetric   ASSESSMENT:  ESRD with thinning skin over aneurysmal areas of left AV fistula. He reports episodes of prolonged bleeding and scab formation in the past several months.  The skin over the aneurysm is very thin with minimal mobility.    PLAN:  Plication of one aneurysm followed by the other on another date.  He would prefer not to have a catheter placed if possible.  He is on HD MWF.  He takes ASA and Plavix daily.    Roxy Horseman PA-C [facility] [place] Office: [phone]  MD in clinic [place]

## 2020-02-03 ENCOUNTER — Other Ambulatory Visit: Payer: Self-pay

## 2020-02-03 ENCOUNTER — Encounter: Payer: Self-pay | Admitting: Physical Medicine and Rehabilitation

## 2020-02-03 ENCOUNTER — Encounter
Payer: Medicare Other | Attending: Physical Medicine and Rehabilitation | Admitting: Physical Medicine and Rehabilitation

## 2020-02-03 VITALS — BP 96/58 | HR 89 | Temp 98.7°F | Ht 71.0 in | Wt 157.4 lb

## 2020-02-03 DIAGNOSIS — L089 Local infection of the skin and subcutaneous tissue, unspecified: Secondary | ICD-10-CM

## 2020-02-03 DIAGNOSIS — Z89431 Acquired absence of right foot: Secondary | ICD-10-CM | POA: Diagnosis present

## 2020-02-03 DIAGNOSIS — E11628 Type 2 diabetes mellitus with other skin complications: Secondary | ICD-10-CM | POA: Diagnosis not present

## 2020-02-03 DIAGNOSIS — L97509 Non-pressure chronic ulcer of other part of unspecified foot with unspecified severity: Secondary | ICD-10-CM | POA: Insufficient documentation

## 2020-02-03 DIAGNOSIS — E11621 Type 2 diabetes mellitus with foot ulcer: Secondary | ICD-10-CM

## 2020-02-03 MED ORDER — GABAPENTIN 100 MG PO CAPS
100.0000 mg | ORAL_CAPSULE | Freq: Three times a day (TID) | ORAL | 0 refills | Status: DC | PRN
Start: 2020-02-03 — End: 2020-04-05

## 2020-02-03 NOTE — Progress Notes (Signed)
Subjective:    Patient ID: [name], male    DOB: 1950/11/22, 69 y.o.   MRN: [id number]  HPI  Mr. Menna is a 69 year old man who presents for hospital follow-up s/p right metatarsal fracture.   His therapy has been going well.   Pain is 7/10 in the left foot. He takes Tramadol and Oxycodone for the pain. The pain is intermittent and throbbing. He has been alternating between the Tramadol and Oxycodone. He uses the Tramadol 2-3 times per day and the Oxycodone the same way. He has signed a pain contract with his PCP.   BP 96/58 in office today.   He has f/u with Dr. Sharol Given on Thursday- they are considering higher amputation.   Pain Inventory Average Pain 7 Pain Right Now 3 My pain is intermittent and throbbing  In the last 24 hours, has pain interfered with the following? General activity 0 Relation with others 0 Enjoyment of life 0 What TIME of day is your pain at its worst? night Sleep (in general) Poor  Pain is worse with: unsure Pain improves with: medication and elevation Relief from Meds: 6  walk with assistance use a walker ability to climb steps?  yes do you drive?  no  I need assistance with the following:  occ shopping mostly independent  trouble walking  Any changes since last visit?  no  Primary care Norina Buzzard MD pcp has seen him since d/c Orthopedist Dr Sharol Given, sees every other week vascular--has fistual in left arm    Family History  Problem Relation Age of Onset  . Heart attack Sister        died @ 64  . Cancer Mother        died @ 56; unknown type  . Diabetes Brother        deceased  . Cirrhosis Father        alcohol related  . Diabetes Father   . Esophageal cancer Neg Hx   . Colon cancer Neg Hx   . Pancreatic cancer Neg Hx   . Stomach cancer Neg Hx    Social History   Socioeconomic History  . Marital status: Married    Spouse name: Not on file  . Number of children: Not on file  . Years of education: Not on file  .  Highest education level: Not on file  Occupational History  . Occupation: retired  Tobacco Use  . Smoking status: Former Smoker    Packs/day: 1.00    Years: 2.00    Pack years: 2.00    Types: Cigarettes    Quit date: 1976    Years since quitting: 45.8  . Smokeless tobacco: Never Used  . Tobacco comment: quit smoking 40 yrs ago  Vaping Use  . Vaping Use: Never used  Substance and Sexual Activity  . Alcohol use: No    Alcohol/week: 0.0 standard drinks    Comment: h/o social drinking  . Drug use: Not Currently    Types: Marijuana  . Sexual activity: Yes    Birth control/protection: None  Other Topics Concern  . Not on file  Social History Narrative   The patient is a Retail buyer.  He is married and has 2 grown children.  Lives in [place] with his wife.  Denies tobacco, alcohol or IV drug abuse or marijuana or cocaine intake.    Social Determinants of Health   Financial Resource Strain:   . Difficulty of Paying Living Expenses: Not on  file  Food Insecurity:   . Worried About Charity fundraiser in the Last Year: Not on file  . Ran Out of Food in the Last Year: Not on file  Transportation Needs:   . Lack of Transportation (Medical): Not on file  . Lack of Transportation (Non-Medical): Not on file  Physical Activity:   . Days of Exercise per Week: Not on file  . Minutes of Exercise per Session: Not on file  Stress:   . Feeling of Stress : Not on file  Social Connections:   . Frequency of Communication with Friends and Family: Not on file  . Frequency of Social Gatherings with Friends and Family: Not on file  . Attends Religious Services: Not on file  . Active Member of Clubs or Organizations: Not on file  . Attends Archivist Meetings: Not on file  . Marital Status: Not on file   Past Surgical History:  Procedure Laterality Date  . ABDOMINAL AORTOGRAM W/LOWER EXTREMITY Bilateral 08/21/2019   Procedure: ABDOMINAL AORTOGRAM W/LOWER EXTREMITY;  Surgeon: Waynetta Sandy, MD;  Location: [place] CV LAB;  Service: Cardiovascular;  Laterality: Bilateral;  . AMPUTATION Right 07/25/2019   Procedure: RIGHT 5th RAY AMPUTATION;  Surgeon: Newt Minion, MD;  Location: [place];  Service: Orthopedics;  Laterality: Right;  . AMPUTATION Right 11/19/2019   Procedure: RIGHT TRANSMETATARSAL AMPUTATION;  Surgeon: Newt Minion, MD;  Location: [place];  Service: Orthopedics;  Laterality: Right;  . ANGIOPLASTY / STENTING FEMORAL Left 12/11/2013   dr berry  . AV FISTULA PLACEMENT Left 03/19/2014   Procedure: CREATION OF ARTERIOVENOUS (AV) FISTULA  LEFT UPPER ARM;  Surgeon: Mal Misty, MD;  Location: [place];  Service: Vascular;  Laterality: Left;  . BACK SURGERY  01/2018   screws placed   . CARDIAC CATHETERIZATION  2001 and 2010   . COLONOSCOPY W/ BIOPSIES AND POLYPECTOMY    . COLONOSCOPY WITH PROPOFOL N/A 08/01/2016   Procedure: COLONOSCOPY WITH PROPOFOL;  Surgeon: Carol Ada, MD;  [facility];  Service: Endoscopy;  Laterality: N/A;  . CORONARY ARTERY BYPASS GRAFT  2019   [facility] x [facility] bypass  . ESOPHAGOGASTRODUODENOSCOPY (EGD) WITH PROPOFOL N/A 08/01/2016   Procedure: ESOPHAGOGASTRODUODENOSCOPY (EGD) WITH PROPOFOL;  Surgeon: Carol Ada, MD;  [facility];  Service: Endoscopy;  Laterality: N/A;  . FOOT FRACTURE SURGERY Right    ligament repair  . FRACTURE SURGERY     left forearm  . GRAFT APPLICATION Right 04/16/7937   Procedure: FAT GRAFT APPLICATION;  Surgeon: Evelina Bucy, DPM;  Location: [place];  Service: Podiatry;  Laterality: Right;  . INGUINAL HERNIA REPAIR Left   . LEFT HEART CATH AND CORS/GRAFTS ANGIOGRAPHY N/A 04/27/2017   Procedure: LEFT HEART CATH AND CORS/GRAFTS ANGIOGRAPHY;  Surgeon: Leonie Man, MD;  Location: [place] CV LAB;  Service: Cardiovascular;  Laterality: N/A;  . LEFT HEART CATHETERIZATION WITH CORONARY ANGIOGRAM N/A 06/22/2014   Procedure: LEFT HEART CATHETERIZATION WITH CORONARY  ANGIOGRAM;  Surgeon: Troy Sine, MD;  Location: [facility] CATH LAB;  Service: Cardiovascular;  Laterality: N/A;  . LOWER EXTREMITY ANGIOGRAM Left 12/11/2013   Procedure: LOWER EXTREMITY ANGIOGRAM;  Surgeon: Lorretta Harp, MD;  Location: [facility] CATH LAB;  Service: Cardiovascular;  Laterality: Left;  . LUMBAR LAMINECTOMY/DECOMPRESSION MICRODISCECTOMY Right 07/03/2017   Procedure: MICRODISCECTOMY LUMBAR FIVE - SACRAL ONE RIGHT;  Surgeon: Consuella Lose, MD;  Location: [place];  Service: Neurosurgery;  Laterality: Right;  . LUMBAR LAMINECTOMY/DECOMPRESSION  MICRODISCECTOMY Right 10/19/2017   Procedure: MICRODISCECTOMY LUMBAR FIVE- SACRAL 1 ONE ;  Surgeon: Consuella Lose, MD;  Location: [place];  Service: Neurosurgery;  Laterality: Right;  . TONSILLECTOMY AND ADENOIDECTOMY    . WISDOM TOOTH EXTRACTION    . WOUND DEBRIDEMENT Right 05/13/2019   Procedure: DEBRIDEMENT WOUND;  Surgeon: Evelina Bucy, DPM;  Location: [facility];  Service: Podiatry;  Laterality: Right;   Past Medical History:  Diagnosis Date  . Anemia of chronic disease   . CAD (coronary artery disease)    a.  Myoview 4/11: EF 53%, no scar or ischemia   c. MV 2012 Nl perfusion, apical thinning.  No ischemia or scar.  EF 49%, appears greater by visual estimate.;  d.  Dob stress echo 12/13:  Negative Dob stress echo. There is no evidence of ischemia.  The LVF is normal. b. Normal cors 2016.  . Carotid stenosis    a. <86% RICA, >38% LICA by duplex 04/7709  . Chronic chest pain    occ  . Constipation    chronic  . Dyspnea    with extertion  . ESRD (end stage renal disease) on dialysis [facility])    M-W-F- [name]  . GERD (gastroesophageal reflux disease)   . History of kidney stones    Passed  . HNP (herniated nucleus pulposus), lumbar   . HTN (hypertension)    echo 3/10: EF 60%, LAE  . Hyperlipidemia   . Nephrolithiasis    "passed them all"  . Peripheral arterial disease ([place])    a. s/p PTCA  right   . Pneumonia yrs  ago  . Restless legs   . Sciatic leg pain   . Snores    a. presumed OSA, pt has refused sleep eval in past.  . Type II diabetes mellitus ([place])    no longer on medications, checks blood glucose at home  . Urinary frequency    BP (!) 96/58 Comment: right arm, has fistula in left  Pulse 89   Temp 98.7 F (37.1 C)   Ht 5\' 11"  (1.803 m)   Wt 157 lb 6.4 oz (71.4 kg)   SpO2 96%   BMI 21.95 kg/m   Opioid Risk Score:   Fall Risk Score:  `1  Depression screen PHQ 2/9  Depression screen PHQ 2/9 02/03/2020  Decreased Interest 0  Down, Depressed, Hopeless 0  PHQ - 2 Score 0  Altered sleeping 3  Tired, decreased energy 0  Change in appetite 2  Feeling bad or failure about yourself  0  Trouble concentrating 0  Moving slowly or fidgety/restless 0  Suicidal thoughts 0  PHQ-9 Score 5  Some recent data might be hidden    Review of Systems  Constitutional: Positive for appetite change.       Poor  HENT: Negative.   Eyes: Negative.   Respiratory: Positive for shortness of breath.   Cardiovascular:       Av fistula  Gastrointestinal: Negative.   Endocrine: Negative.   Genitourinary:       Dialysis M-W-F  Musculoskeletal: Positive for gait problem.  Skin: Positive for wound.       Still has sutures in foot  Allergic/Immunologic: Negative.   Hematological: Bruises/bleeds easily.       Plavix  Psychiatric/Behavioral: Negative.   All other systems reviewed and are negative.      Objective:   Physical Exam Gen: no distress, normal appearing HEENT: oral mucosa pink and moist, NCAT Cardio: Reg  rate Chest: normal effort, normal rate of breathing Abd: soft, non-distended Ext: no edema Skin: intact Neuro: Alert and oriented Musculoskeletal: Ambulating with walker. Boot on left foot.  Psych: pleasant, normal affect    Assessment & Plan:  Mr. Abrahamsen is a 69 year old man who presents for hospital follow-up after CIR admission for right transmetatarsal amputation.  1)  Right transmetatarsal amputation -Has appropriate follow-up with Dr. Sharol Given. -Has been healing slowly and Dr. Sharol Given may consider higher level amputation -Pain is currently 7/10. He prefers not to pursue pain contract and I agree that minimizing use of opioids is ideal.  -May increase Gabapentin to 300mg  per day (100mg  AM and 200 HS)- I have provided new script.  -He has a handicap placard.  -Therapy is progressing well!  2) BP is soft- denies dizziness. HD patient.   3) Home support- excellent from his family.   4) F/u: Has appropriate f/u with PCP and does not require any refills at this time.   All questions answered. RTC PRN.

## 2020-02-05 ENCOUNTER — Telehealth: Payer: Self-pay

## 2020-02-05 ENCOUNTER — Ambulatory Visit (INDEPENDENT_AMBULATORY_CARE_PROVIDER_SITE_OTHER): Payer: Medicare Other | Admitting: Orthopedic Surgery

## 2020-02-05 ENCOUNTER — Encounter: Payer: Self-pay | Admitting: Orthopedic Surgery

## 2020-02-05 VITALS — Ht 71.0 in | Wt 157.0 lb

## 2020-02-05 DIAGNOSIS — Z89431 Acquired absence of right foot: Secondary | ICD-10-CM

## 2020-02-05 MED ORDER — OXYCODONE-ACETAMINOPHEN 5-325 MG PO TABS
1.0000 | ORAL_TABLET | ORAL | 0 refills | Status: DC | PRN
Start: 2020-02-05 — End: 2020-03-11

## 2020-02-05 NOTE — Telephone Encounter (Signed)
April from home health called she is requesting orders to continue home health. Call back:[phone]

## 2020-02-06 NOTE — Telephone Encounter (Signed)
I called and sw HHN April to advise verbal ok for once a week for the next 3 weeks for incisional check. He is to wash the area daily and apply sock. Will call with changes otherwise will eval at office visit on 02/19/20 and will d/c from services if remains healed. To call with any other questions/concerns.

## 2020-02-07 ENCOUNTER — Other Ambulatory Visit (HOSPITAL_COMMUNITY)
Admission: RE | Admit: 2020-02-07 | Discharge: 2020-02-07 | Disposition: A | Discharge status: Home / Self Care | Payer: Medicare Other | Source: Ambulatory Visit | Attending: Vascular Surgery | Admitting: Vascular Surgery

## 2020-02-07 DIAGNOSIS — Z01812 Encounter for preprocedural laboratory examination: Secondary | ICD-10-CM | POA: Diagnosis present

## 2020-02-07 DIAGNOSIS — Z20822 Contact with and (suspected) exposure to covid-19: Secondary | ICD-10-CM | POA: Diagnosis not present

## 2020-02-08 LAB — SARS CORONAVIRUS 2 (TAT 6-24 HRS): SARS Coronavirus 2: NEGATIVE

## 2020-02-09 ENCOUNTER — Other Ambulatory Visit: Payer: Self-pay

## 2020-02-09 ENCOUNTER — Encounter (HOSPITAL_COMMUNITY): Payer: Self-pay | Admitting: Vascular Surgery

## 2020-02-09 NOTE — Anesthesia Preprocedure Evaluation (Addendum)
Anesthesia Evaluation  Patient identified by MRN, date of birth, ID band Patient awake    Reviewed: Allergy & Precautions, NPO status , Patient's Chart, lab work & pertinent test results  Airway Mallampati: III  TM Distance: >3 FB Neck ROM: Full    Dental   Pulmonary former smoker,    breath sounds clear to auscultation       Cardiovascular hypertension,  Rhythm:Regular Rate:Normal     Neuro/Psych    GI/Hepatic   Endo/Other  diabetes  Renal/GU      Musculoskeletal   Abdominal   Peds  Hematology   Anesthesia Other Findings   Reproductive/Obstetrics                            Anesthesia Physical Anesthesia Plan  ASA: III  Anesthesia Plan: MAC   Post-op Pain Management:    Induction: Intravenous  PONV Risk Score and Plan: Ondansetron  Airway Management Planned: Natural Airway and Nasal Cannula  Additional Equipment:   Intra-op Plan:   Post-operative Plan:   Informed Consent: I have reviewed the patients History and Physical, chart, labs and discussed the procedure including the risks, benefits and alternatives for the proposed anesthesia with the patient or authorized representative who has indicated his/her understanding and acceptance.       Plan Discussed with: CRNA and Anesthesiologist  Anesthesia Plan Comments: (See PAT note by Karoline Caldwell, PA-C: Follows with cardiology for history of CAD status post CABG 2018 with LIMA to LAD (cath 04/27/2017 revealed patent LIMA to LAD with otherwise noncritical disease and normal LV function), hypertension, carotid disease (Doppler 06/18/2018 showed moderate left ICA stenosis), PVD status post left SFA PTA 12/11/2013 and recent balloon angioplasty of right posterior tibial artery, right anterior tibial artery, stent right popliteal artery on 08/21/2019 by Dr. Donzetta Matters. Last seen by Dr. Gwenlyn Found 06/11/2019, CV status stable at that time.  Recently  underwent right transmetatarsal amputation by Dr. Sharol Given on 11/19/19.  ESRD on HD Monday Wednesday Friday.  DM2 well-controlled last A1c 6.4 on 07/24/2019.  Will need DOS labs and eval.   EKG 10/15/19: Sinus rhythm. Rate 91. Probable anteroseptal infarct, recent. Abnormal T, consider ischemia, inferior leads. No significant change since 07/24/2019.  TTE 02/23/2018: - Left ventricle: The cavity size was normal. There was severe  concentric hypertrophy. Systolic function was normal. The  estimated ejection fraction was in the range of 60% to 65%. Wall  motion was normal; there were no regional wall motion  abnormalities. There was an increased relative contribution of  atrial contraction to ventricular filling. Doppler parameters are  consistent with abnormal left ventricular relaxation (grade 1  diastolic dysfunction). Doppler parameters are consistent with  high ventricular filling pressure.  - Aortic valve: Trileaflet; mildly thickened, mildly calcified  leaflets. Valve area (VTI): 1.99 cm^2. Valve area (Vmax): 2.12  cm^2. Valve area (Vmean): 2.02 cm^2.  - Mitral valve: Severely calcified annulus. Moderate diffuse  thickening and calcification of the anterior leaflet and  posterior leaflet. Mobility of the posterior leaflet was  restricted to the point of immobility. The findings are  consistent with moderate stenosis. There was mild regurgitation.  Mean gradient (D): 7 mm Hg. Valve area by continuity equation  (using LVOT flow): 1.11 cm^2.  - Left atrium: The atrium was mildly dilated.  - Pulmonary arteries: Systolic pressure could not be accurately  estimated as IVC is not visualized.   Impressions:   - The right ventricular systolic pressure  was increased consistent  with mild pulmonary hypertension.   LHC 04/27/2017: Prox LAD lesion is 75% stenosed. -Focal lesion that was previously described. LIMA-LAD is widely patent and is normal in  caliber. There is competitive flow. Otherwise minimal disease throughout. Nothing made greater than 40% in the ostial circumflex. The left ventricular systolic function is normal. The left ventricular ejection fraction is 50-55% by visual estimate. LV end diastolic pressure is low. - ~0-3 mmHg There is no aortic valve stenosis.  Angiographically no culprit lesion to explain the patient's symptoms. He does have a significant LAD lesion which is a very focal lesion and easily stent pull, however there is a widely patent LIMA graft distally. There is actually retrograde filling from the LIMA graft to the diagonal branch which would be the only branch jeopardized by the more upstream LAD lesion. Nothing to explain the patient's symptoms. In fact, his LVEDP is very low after dialysis. This would indicate that he was adequately dialyzed and argue against increased LVEDP causing microvascular ischemic symptoms.   At this point, I think the only choice is to continue with his low-dose beta-blocker and nitrate for what still is probably microvascular disease disease. At least we know that with his ongoing symptoms are not likely related to any flow-limiting microvascular lesions.  )       Anesthesia Quick Evaluation

## 2020-02-09 NOTE — Progress Notes (Signed)
Patient denies SOB, fever, cough or chest pain.  PCP -Dr Clayton Bibles Cardiologist - Dr Gwenlyn Found  Chest x-ray - 10/15/19 (2V) EKG - 10/15/19 Stress Test - 08/24/16 (CE),  ECHO - 02/23/18 Cardiac Cath -04/27/17  Sleep Study -  Yes CPAP - Does not use cpap  Fasting Blood Sugar -  100s Checks Blood Sugar 2-3 times a day DM, type 2, no meds  . If your blood sugar is less than 70 mg/dL, you will need to treat for low blood sugar: o Treat a low blood sugar (less than 70 mg/dL) with  cup of clear juice (cranberry or apple), 4 glucose tablets, OR glucose gel. o Recheck blood sugar in 15 minutes after treatment (to make sure it is greater than 70 mg/dL). If your blood sugar is not greater than 70 mg/dL on recheck, call [phone] for further instructions.  Blood Thinner Instructions:  Last dose of plavix was on 02/04/20  Anesthesia review: Yes  STOP now taking any Aspirin (unless otherwise instructed by your surgeon), Aleve, Naproxen, Ibuprofen, Motrin, Advil, Goody's, BC's, all herbal medications, fish oil, and all vitamins.   Coronavirus Screening Covid test on 02/07/20 was negative.  Patient verbalized understanding of instructions that were given via phone.

## 2020-02-09 NOTE — Progress Notes (Signed)
Anesthesia Chart Review: Same day workup  Follows with cardiology for history of CAD status post CABG 2018 with LIMA to LAD (cath 04/27/2017 revealed patent LIMA to LAD with otherwise noncritical disease and normal LV function), hypertension, carotid disease (Doppler 06/18/2018 showed moderate left ICA stenosis), PVD status post left SFA PTA 12/11/2013 and recent balloon angioplasty of right posterior tibial artery, right anterior tibial artery, stent right popliteal artery on 08/21/2019 by Dr. Donzetta Matters. Last seen by Dr. Gwenlyn Found 06/11/2019, CV status stable at that time.  Recently underwent right transmetatarsal amputation by Dr. Sharol Given on 11/19/19.  ESRD on HD Monday Wednesday Friday.  DM2 well-controlled last A1c 6.4 on 07/24/2019.  Will need DOS labs and eval.   EKG 10/15/19: Sinus rhythm. Rate 91. Probable anteroseptal infarct, recent. Abnormal T, consider ischemia, inferior leads. No significant change since 07/24/2019.  TTE 02/23/2018: - Left ventricle: The cavity size was normal. There was severe  concentric hypertrophy. Systolic function was normal. The  estimated ejection fraction was in the range of 60% to 65%. Wall  motion was normal; there were no regional wall motion  abnormalities. There was an increased relative contribution of  atrial contraction to ventricular filling. Doppler parameters are  consistent with abnormal left ventricular relaxation (grade 1  diastolic dysfunction). Doppler parameters are consistent with  high ventricular filling pressure.  - Aortic valve: Trileaflet; mildly thickened, mildly calcified  leaflets. Valve area (VTI): 1.99 cm^2. Valve area (Vmax): 2.12  cm^2. Valve area (Vmean): 2.02 cm^2.  - Mitral valve: Severely calcified annulus. Moderate diffuse  thickening and calcification of the anterior leaflet and  posterior leaflet. Mobility of the posterior leaflet was  restricted to the point of immobility. The findings are   consistent with moderate stenosis. There was mild regurgitation.  Mean gradient (D): 7 mm Hg. Valve area by continuity equation  (using LVOT flow): 1.11 cm^2.  - Left atrium: The atrium was mildly dilated.  - Pulmonary arteries: Systolic pressure could not be accurately  estimated as IVC is not visualized.   Impressions:   - The right ventricular systolic pressure was increased consistent  with mild pulmonary hypertension.   LHC 04/27/2017:  Prox LAD lesion is 75% stenosed. -Focal lesion that was previously described.  LIMA-LAD is widely patent and is normal in caliber. There is competitive flow.  Otherwise minimal disease throughout. Nothing made greater than 40% in the ostial circumflex.  The left ventricular systolic function is normal. The left ventricular ejection fraction is 50-55% by visual estimate.  LV end diastolic pressure is low. - ~0-3 mmHg  There is no aortic valve stenosis.  Angiographically no culprit lesion to explain the patient's symptoms. He does have a significant LAD lesion which is a very focal lesion and easily stent pull, however there is a widely patent LIMA graft distally. There is actually retrograde filling from the LIMA graft to the diagonal branch which would be the only branch jeopardized by the more upstream LAD lesion. Nothing to explain the patient's symptoms. In fact, his LVEDP is very low after dialysis. This would indicate that he was adequately dialyzed and argue against increased LVEDP causing microvascular ischemic symptoms.   At this point, I think the only choice is to continue with his low-dose beta-blocker and nitrate for what still is probably microvascular disease disease. At least we know that with his ongoing symptoms are not likely related to any flow-limiting microvascular lesions.   Wynonia Musty [facility]/Anesthesiology Phone [phone] 02/09/2020 3:18 PM

## 2020-02-10 ENCOUNTER — Ambulatory Visit (HOSPITAL_COMMUNITY): Payer: Medicare Other | Admitting: Vascular Surgery

## 2020-02-10 ENCOUNTER — Encounter (HOSPITAL_COMMUNITY)
Admission: RE | Disposition: A | Discharge status: Home / Self Care | Payer: Self-pay | Source: Home / Self Care | Attending: Vascular Surgery

## 2020-02-10 ENCOUNTER — Encounter (HOSPITAL_COMMUNITY): Payer: Self-pay | Admitting: Vascular Surgery

## 2020-02-10 ENCOUNTER — Ambulatory Visit (HOSPITAL_COMMUNITY)
Admission: RE | Admit: 2020-02-10 | Discharge: 2020-02-10 | Disposition: A | Discharge status: Home / Self Care | Payer: Medicare Other | Attending: Vascular Surgery | Admitting: Vascular Surgery

## 2020-02-10 DIAGNOSIS — E1122 Type 2 diabetes mellitus with diabetic chronic kidney disease: Secondary | ICD-10-CM | POA: Insufficient documentation

## 2020-02-10 DIAGNOSIS — Y841 Kidney dialysis as the cause of abnormal reaction of the patient, or of later complication, without mention of misadventure at the time of the procedure: Secondary | ICD-10-CM | POA: Insufficient documentation

## 2020-02-10 DIAGNOSIS — Z91018 Allergy to other foods: Secondary | ICD-10-CM | POA: Insufficient documentation

## 2020-02-10 DIAGNOSIS — N186 End stage renal disease: Secondary | ICD-10-CM

## 2020-02-10 DIAGNOSIS — Z87891 Personal history of nicotine dependence: Secondary | ICD-10-CM | POA: Diagnosis not present

## 2020-02-10 DIAGNOSIS — Z888 Allergy status to other drugs, medicaments and biological substances status: Secondary | ICD-10-CM | POA: Insufficient documentation

## 2020-02-10 DIAGNOSIS — Z992 Dependence on renal dialysis: Secondary | ICD-10-CM | POA: Diagnosis not present

## 2020-02-10 DIAGNOSIS — T82898A Other specified complication of vascular prosthetic devices, implants and grafts, initial encounter: Secondary | ICD-10-CM | POA: Diagnosis not present

## 2020-02-10 DIAGNOSIS — I120 Hypertensive chronic kidney disease with stage 5 chronic kidney disease or end stage renal disease: Secondary | ICD-10-CM | POA: Diagnosis not present

## 2020-02-10 DIAGNOSIS — T82510A Breakdown (mechanical) of surgically created arteriovenous fistula, initial encounter: Secondary | ICD-10-CM | POA: Insufficient documentation

## 2020-02-10 DIAGNOSIS — N185 Chronic kidney disease, stage 5: Secondary | ICD-10-CM | POA: Diagnosis not present

## 2020-02-10 HISTORY — DX: Dependence on other enabling machines and devices: Z99.89

## 2020-02-10 HISTORY — DX: Dependence on wheelchair: Z99.3

## 2020-02-10 HISTORY — PX: FISTULA SUPERFICIALIZATION: SHX6341

## 2020-02-10 LAB — POCT I-STAT, CHEM 8
BUN: 29 mg/dL — ABNORMAL HIGH (ref 8–23)
Calcium, Ion: 1.16 mmol/L (ref 1.15–1.40)
Chloride: 95 mmol/L — ABNORMAL LOW (ref 98–111)
Creatinine, Ser: 8.5 mg/dL — ABNORMAL HIGH (ref 0.61–1.24)
Glucose, Bld: 93 mg/dL (ref 70–99)
HCT: 41 % (ref 39.0–52.0)
Hemoglobin: 13.9 g/dL (ref 13.0–17.0)
Potassium: 3.6 mmol/L (ref 3.5–5.1)
Sodium: 138 mmol/L (ref 135–145)
TCO2: 32 mmol/L (ref 22–32)

## 2020-02-10 LAB — GLUCOSE, CAPILLARY
Glucose-Capillary: 87 mg/dL (ref 70–99)
Glucose-Capillary: 83 mg/dL (ref 70–99)

## 2020-02-10 SURGERY — FISTULA SUPERFICIALIZATION
Anesthesia: Monitor Anesthesia Care | Site: Arm Upper | Laterality: Left

## 2020-02-10 MED ORDER — LIDOCAINE-EPINEPHRINE 0.5 %-1:200000 IJ SOLN
INTRAMUSCULAR | Status: AC
  Filled 2020-02-10: qty 1

## 2020-02-10 MED ORDER — FENTANYL CITRATE (PF) 250 MCG/5ML IJ SOLN
INTRAMUSCULAR | Status: DC | PRN
  Administered 2020-02-10 (×2): 25 ug via INTRAVENOUS

## 2020-02-10 MED ORDER — ONDANSETRON HCL 4 MG/2ML IJ SOLN: 4.0000 mg | Freq: Once | INTRAMUSCULAR | Status: DC | PRN

## 2020-02-10 MED ORDER — OXYCODONE HCL 5 MG PO TABS: 5.0000 mg | ORAL_TABLET | Freq: Once | ORAL | Status: DC | PRN

## 2020-02-10 MED ORDER — LACTATED RINGERS IV SOLN: INTRAVENOUS | Status: DC

## 2020-02-10 MED ORDER — LIDOCAINE-EPINEPHRINE 0.5 %-1:200000 IJ SOLN
INTRAMUSCULAR | Status: DC | PRN
  Administered 2020-02-10: 50 mL

## 2020-02-10 MED ORDER — PROPOFOL 10 MG/ML IV BOLUS
INTRAVENOUS | Status: AC
  Filled 2020-02-10: qty 20

## 2020-02-10 MED ORDER — SODIUM CHLORIDE 0.9 % IV SOLN
INTRAVENOUS | Status: AC
  Filled 2020-02-10: qty 1.2

## 2020-02-10 MED ORDER — LIDOCAINE 2% (20 MG/ML) 5 ML SYRINGE
INTRAMUSCULAR | Status: DC | PRN
  Administered 2020-02-10: 40 mg via INTRAVENOUS

## 2020-02-10 MED ORDER — PROPOFOL 500 MG/50ML IV EMUL
INTRAVENOUS | Status: DC | PRN
  Administered 2020-02-10: 20 ug/kg/min via INTRAVENOUS

## 2020-02-10 MED ORDER — PROPOFOL 10 MG/ML IV BOLUS
INTRAVENOUS | Status: DC | PRN
  Administered 2020-02-10: 30 mg via INTRAVENOUS
  Administered 2020-02-10: 20 mg via INTRAVENOUS

## 2020-02-10 MED ORDER — ONDANSETRON HCL 4 MG/2ML IJ SOLN
INTRAMUSCULAR | Status: AC
  Filled 2020-02-10: qty 2

## 2020-02-10 MED ORDER — CEFAZOLIN SODIUM-DEXTROSE 2-4 GM/100ML-% IV SOLN
2.0000 g | INTRAVENOUS | Status: AC
  Administered 2020-02-10: 2 g via INTRAVENOUS
  Filled 2020-02-10: qty 100

## 2020-02-10 MED ORDER — 0.9 % SODIUM CHLORIDE (POUR BTL) OPTIME
TOPICAL | Status: DC | PRN
  Administered 2020-02-10: 1000 mL

## 2020-02-10 MED ORDER — MIDAZOLAM HCL 2 MG/2ML IJ SOLN
INTRAMUSCULAR | Status: AC
  Filled 2020-02-10: qty 2

## 2020-02-10 MED ORDER — PROPOFOL 1000 MG/100ML IV EMUL
INTRAVENOUS | Status: AC
  Filled 2020-02-10: qty 100

## 2020-02-10 MED ORDER — ONDANSETRON HCL 4 MG/2ML IJ SOLN
INTRAMUSCULAR | Status: DC | PRN
  Administered 2020-02-10: 4 mg via INTRAVENOUS

## 2020-02-10 MED ORDER — MIDAZOLAM HCL 5 MG/5ML IJ SOLN
INTRAMUSCULAR | Status: DC | PRN
  Administered 2020-02-10 (×2): 1 mg via INTRAVENOUS

## 2020-02-10 MED ORDER — FENTANYL CITRATE (PF) 100 MCG/2ML IJ SOLN: 25.0000 ug | INTRAMUSCULAR | Status: DC | PRN

## 2020-02-10 MED ORDER — ORAL CARE MOUTH RINSE: 15.0000 mL | Freq: Once | OROMUCOSAL | Status: AC

## 2020-02-10 MED ORDER — CHLORHEXIDINE GLUCONATE 4 % EX LIQD: 60.0000 mL | Freq: Once | CUTANEOUS | Status: DC

## 2020-02-10 MED ORDER — PHENYLEPHRINE 40 MCG/ML (10ML) SYRINGE FOR IV PUSH (FOR BLOOD PRESSURE SUPPORT)
PREFILLED_SYRINGE | INTRAVENOUS | Status: DC | PRN
  Administered 2020-02-10 (×3): 80 ug via INTRAVENOUS

## 2020-02-10 MED ORDER — CHLORHEXIDINE GLUCONATE 0.12 % MT SOLN
15.0000 mL | Freq: Once | OROMUCOSAL | Status: AC
  Administered 2020-02-10: 15 mL via OROMUCOSAL
  Filled 2020-02-10: qty 15

## 2020-02-10 MED ORDER — LIDOCAINE 2% (20 MG/ML) 5 ML SYRINGE
INTRAMUSCULAR | Status: AC
  Filled 2020-02-10: qty 5

## 2020-02-10 MED ORDER — PHENYLEPHRINE 40 MCG/ML (10ML) SYRINGE FOR IV PUSH (FOR BLOOD PRESSURE SUPPORT)
PREFILLED_SYRINGE | INTRAVENOUS | Status: AC
  Filled 2020-02-10: qty 10

## 2020-02-10 MED ORDER — SODIUM CHLORIDE 0.9 % IV SOLN
INTRAVENOUS | Status: DC | PRN
  Administered 2020-02-10: 08:00:00 500 mL

## 2020-02-10 MED ORDER — FENTANYL CITRATE (PF) 250 MCG/5ML IJ SOLN
INTRAMUSCULAR | Status: AC
  Filled 2020-02-10: qty 5

## 2020-02-10 MED ORDER — SODIUM CHLORIDE 0.9 % IV SOLN: INTRAVENOUS | Status: DC

## 2020-02-10 MED ORDER — OXYCODONE HCL 5 MG/5ML PO SOLN: 5.0000 mg | Freq: Once | ORAL | Status: DC | PRN

## 2020-02-10 SURGICAL SUPPLY — 37 items
ADH SKN CLS APL DERMABOND .7 (GAUZE/BANDAGES/DRESSINGS) ×1
ADH SKN CLS LQ APL DERMABOND (GAUZE/BANDAGES/DRESSINGS) ×1
ARMBAND PINK RESTRICT EXTREMIT (MISCELLANEOUS) ×2 IMPLANT
CANISTER SUCT 3000ML PPV (MISCELLANEOUS) ×2 IMPLANT
CANNULA VESSEL 3MM 2 BLNT TIP (CANNULA) ×1 IMPLANT
CLIP LIGATING EXTRA MED SLVR (CLIP) ×2 IMPLANT
CLIP LIGATING EXTRA SM BLUE (MISCELLANEOUS) ×2 IMPLANT
COVER PROBE W GEL 5X96 (DRAPES) ×2 IMPLANT
COVER WAND RF STERILE (DRAPES) ×2 IMPLANT
DECANTER SPIKE VIAL GLASS SM (MISCELLANEOUS) ×2 IMPLANT
DERMABOND ADHESIVE PROPEN (GAUZE/BANDAGES/DRESSINGS) ×1
DERMABOND ADVANCED (GAUZE/BANDAGES/DRESSINGS) ×1
DERMABOND ADVANCED .7 DNX12 (GAUZE/BANDAGES/DRESSINGS) ×1 IMPLANT
DERMABOND ADVANCED .7 DNX6 (GAUZE/BANDAGES/DRESSINGS) IMPLANT
ELECT REM PT RETURN 9FT ADLT (ELECTROSURGICAL) ×2
ELECTRODE REM PT RTRN 9FT ADLT (ELECTROSURGICAL) ×1 IMPLANT
GLOVE BIO SURGEON STRL SZ 6.5 (GLOVE) ×2 IMPLANT
GLOVE BIOGEL PI IND STRL 6 (GLOVE) ×1 IMPLANT
GLOVE BIOGEL PI IND STRL 6.5 (GLOVE) ×1 IMPLANT
GLOVE BIOGEL PI INDICATOR 6 (GLOVE) ×1
GLOVE BIOGEL PI INDICATOR 6.5 (GLOVE) ×1
GLOVE SS BIOGEL STRL SZ 7.5 (GLOVE) ×1 IMPLANT
GLOVE SUPERSENSE BIOGEL SZ 7.5 (GLOVE) ×1
GOWN STRL REUS W/ TWL LRG LVL3 (GOWN DISPOSABLE) ×3 IMPLANT
GOWN STRL REUS W/TWL LRG LVL3 (GOWN DISPOSABLE) ×6
KIT BASIN OR (CUSTOM PROCEDURE TRAY) ×2 IMPLANT
KIT TURNOVER KIT B (KITS) ×2 IMPLANT
NS IRRIG 1000ML POUR BTL (IV SOLUTION) ×2 IMPLANT
PACK CV ACCESS (CUSTOM PROCEDURE TRAY) ×2 IMPLANT
PAD ARMBOARD 7.5X6 YLW CONV (MISCELLANEOUS) ×4 IMPLANT
SUT PROLENE 5 0 C 1 24 (SUTURE) ×2 IMPLANT
SUT PROLENE 6 0 CC (SUTURE) ×2 IMPLANT
SUT VIC AB 3-0 SH 27 (SUTURE) ×2
SUT VIC AB 3-0 SH 27X BRD (SUTURE) ×1 IMPLANT
TOWEL GREEN STERILE (TOWEL DISPOSABLE) ×2 IMPLANT
UNDERPAD 30X36 HEAVY ABSORB (UNDERPADS AND DIAPERS) ×2 IMPLANT
WATER STERILE IRR 1000ML POUR (IV SOLUTION) ×2 IMPLANT

## 2020-02-10 NOTE — Anesthesia Procedure Notes (Signed)
Procedure Name: MAC Date/Time: 02/10/2020 7:30 AM Performed by: Michele Rockers, CRNA Pre-anesthesia Checklist: Patient identified, Emergency Drugs available, Suction available, Timeout performed and Patient being monitored Patient Re-evaluated:Patient Re-evaluated prior to induction Oxygen Delivery Method: Simple face mask

## 2020-02-10 NOTE — Op Note (Signed)
    OPERATIVE REPORT  DATE OF SURGERY: 02/10/2020  PATIENT: Jon Anderson, 69 y.o. male MRN: [id number]  DOB: 06-08-1950  PRE-OPERATIVE DIAGNOSIS: End-stage renal disease with aneurysmal change left upper arm AV fistula  POST-OPERATIVE DIAGNOSIS:  Same  PROCEDURE: Revision of left upper arm AV fistula with resection of threatened skin and plication of underlying venous aneurysm  SURGEON:  Curt Jews, M.D.  PHYSICIAN ASSISTANT: Rhyne PAC  The assistant was needed for exposure and to expedite the case  ANESTHESIA: Local with sedation  EBL: per anesthesia record  Total I/O In: 500 [I.V.:400; IV Piggyback:100] Out: 10 [Blood:10]  BLOOD ADMINISTERED: none  DRAINS: none  SPECIMEN: none  COUNTS CORRECT:  YES  PATIENT DISPOSITION:  PACU - hemodynamically stable  PROCEDURE DETAILS: The patient was taken operating placed to position where the area of the left arm prepped draped you sterile fashion.  Patient had 2 aneurysmal changes over his left upper arm fistula.  He had been having prolonged bleeding from these with thinning of the skin.  The lower of these closer to the antecubital space was anesthetized with lidocaine and epinephrine.  The ellipse of skin was used around this area to remove the marginally viable skin.  This was carried down to the area of the cephalic vein fistula.  The vein was mobilized circumferentially under the incision.  The vein was occluded proximally and distally.  The skin was removed from the vein.  The vein was opened longitudinally and a ellipse of the vein was removed as well.  The vein was closed primarily in 2 layers of 5-0 Prolene suture.  Clamps were removed from the vein and there was excellent thrill in the fistula.  Wound irrigated with saline.  Hemostasis to electrocautery.  The wound was closed with 3-0 Vicryl in the subcuticular tissue.  Sterile dressing was applied and the patient was transferred to the recovery room in stable  condition   Rosetta Posner, M.D., [facility] 02/10/2020 10:52 AM

## 2020-02-10 NOTE — Anesthesia Postprocedure Evaluation (Signed)
Anesthesia Post Note  Patient: Jon Anderson  Procedure(s) Performed: 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 (Left Arm Upper)     Patient location during evaluation: PACU Anesthesia Type: MAC Level of consciousness: awake and alert Pain management: pain level controlled Vital Signs Assessment: post-procedure vital signs reviewed and stable Respiratory status: spontaneous breathing, nonlabored ventilation, respiratory function stable and patient connected to nasal cannula oxygen Cardiovascular status: stable and blood pressure returned to baseline Postop Assessment: no apparent nausea or vomiting Anesthetic complications: no   No complications documented.  Last Vitals:  Vitals:   02/10/20 0920 02/10/20 0935  BP: 111/60 130/70  Pulse: (!) 57 (!) 59  Resp: 15 14  Temp:  (!) 36.4 C  SpO2: 100% 100%    Last Pain:  Vitals:   02/10/20 0935  TempSrc:   PainSc: 0-No pain                 Sahiti Joswick COKER

## 2020-02-10 NOTE — Transfer of Care (Signed)
Immediate Anesthesia Transfer of Care Note  Patient: Jon Anderson  Procedure(s) Performed: 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 (Left Arm Upper)  Patient Location: PACU  Anesthesia Type:MAC  Level of Consciousness: drowsy, patient cooperative and responds to stimulation  Airway & Oxygen Therapy: Patient Spontanous Breathing  Post-op Assessment: Report given to RN, Post -op Vital signs reviewed and stable and Patient moving all extremities X 4  Post vital signs: Reviewed and stable  Last Vitals:  Vitals Value Taken Time  BP 110/78 02/10/20 0905  Temp 36.1 C 02/10/20 0905  Pulse 64 02/10/20 0910  Resp 17 02/10/20 0910  SpO2 94 % 02/10/20 0910  Vitals shown include unvalidated device data.  Last Pain:  Vitals:   02/10/20 0905  TempSrc:   PainSc: 0-No pain      Patients Stated Pain Goal: 3 (24/82/50 0370)  Complications: No complications documented.

## 2020-02-10 NOTE — Discharge Instructions (Signed)
[facility]  Discharge Instructions  AV Fistula or Graft Surgery for Dialysis Access  Please refer to the following instructions for your post-procedure care. Your surgeon or physician assistant will discuss any changes with you.  Activity  You may drive the day following your surgery, if you are comfortable and no longer taking prescription pain medication. Resume full activity as the soreness in your incision resolves.  Bathing/Showering  You may shower after you go home. Keep your incision dry for 48 hours. Do not soak in a bathtub, hot tub, or swim until the incision heals completely. You may not shower if you have a hemodialysis catheter.  Incision Care  Clean your incision with mild soap and water after 48 hours. Pat the area dry with a clean towel. You do not need a bandage unless otherwise instructed. Do not apply any ointments or creams to your incision. You may have skin glue on your incision. Do not peel it off. It will come off on its own in about one week. Your arm may swell a bit after surgery. To reduce swelling use pillows to elevate your arm so it is above your heart. Your doctor will tell you if you need to lightly wrap your arm with an ACE bandage.  Diet  Resume your normal diet. There are not special food restrictions following this procedure. In order to heal from your surgery, it is CRITICAL to get adequate nutrition. Your body requires vitamins, minerals, and protein. Vegetables are the best source of vitamins and minerals. Vegetables also provide the perfect balance of protein. Processed food has little nutritional value, so try to avoid this.  Medications  Resume taking all of your medications. If your incision is causing pain, you may take over-the counter pain relievers such as acetaminophen (Tylenol). If you were prescribed a stronger pain medication, please be aware these medications can cause nausea and constipation. Prevent  nausea by taking the medication with a snack or meal. Avoid constipation by drinking plenty of fluids and eating foods with high amount of fiber, such as fruits, vegetables, and grains.  Do not take Tylenol if you are taking prescription pain medications.  Follow up Your surgeon may want to see you in the office following your access surgery. If so, this will be arranged at the time of your surgery.  Please call us immediately for any of the following conditions:  . Increased pain, redness, drainage (pus) from your incision site . Fever of 101 degrees or higher . Severe or worsening pain at your incision site . Hand pain or numbness. .  Reduce your risk of vascular disease:  . Stop smoking. If you would like help, call [facility] at 1-800-QUIT-NOW 720-427-9263) or [place] at [phone]  . Manage your cholesterol . Maintain a desired weight . Control your diabetes . Keep your blood pressure down  Dialysis  It will take several weeks to several months for your new dialysis access to be ready for use. Your surgeon will determine when it is okay to use it. Your nephrologist will continue to direct your dialysis. You can continue to use your Permcath until your new access is ready for use.   02/10/2020 Jon Anderson [id number] Jun 01, 1950  Surgeon(s): Early, Arvilla Meres, MD  Procedure(s): 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   x May stick graft on designated area only:  Do NOT stick over incision x 6 weeks. May stick above and below incision now. SEE DIAGRAM  If you have any questions, please call the office at [phone].

## 2020-02-10 NOTE — Interval H&P Note (Signed)
History and Physical Interval Note:  02/10/2020 7:14 AM  Jon Anderson  has presented today for surgery, with the diagnosis of DISTAL ANEURYSM OF AVF.  The various methods of treatment have been discussed with the patient and family. After consideration of risks, benefits and other options for treatment, the patient has consented to  Procedure(s): 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 (Left) as a surgical intervention.  The patient's history has been reviewed, patient examined, no change in status, stable for surgery.  I have reviewed the patient's chart and labs.  Questions were answered to the patient's satisfaction.     Curt Jews

## 2020-02-11 ENCOUNTER — Encounter (HOSPITAL_COMMUNITY): Payer: Self-pay | Admitting: Vascular Surgery

## 2020-02-12 NOTE — Addendum Note (Signed)
Addendum  created 02/12/20 2123 by Roberts Gaudy, MD   Intraprocedure Staff edited

## 2020-02-14 ENCOUNTER — Other Ambulatory Visit: Payer: Self-pay | Admitting: Physician Assistant

## 2020-02-19 ENCOUNTER — Encounter: Payer: Self-pay | Admitting: Orthopedic Surgery

## 2020-02-19 ENCOUNTER — Ambulatory Visit (INDEPENDENT_AMBULATORY_CARE_PROVIDER_SITE_OTHER): Payer: Medicare Other | Admitting: Physician Assistant

## 2020-02-19 VITALS — Ht 71.0 in | Wt 157.0 lb

## 2020-02-19 DIAGNOSIS — M86271 Subacute osteomyelitis, right ankle and foot: Secondary | ICD-10-CM

## 2020-02-19 NOTE — Progress Notes (Signed)
Office Visit Note   Patient: Jon Anderson           Date of Birth: 08/04/1950           MRN: [id number] Visit Date: 02/19/2020              Requested by: [name], MD [id number] [place],  [place] [address] PCP: [name], MD  Chief Complaint  Patient presents with  . Right Foot - Routine Post Op    11/19/19 right transmet amputation       HPI: This pleasant gentleman who is 3 months status post right transmetatarsal amputation.  He has been wearing avive compression sock as well as using Trental and a nitroglycerin patch.  He does feel he is slowly improving he is also complaining of some posterior heel pain.  He has restless leg syndrome and he notices that his heel bones against a chair and this seems to provoke the pain.  Assessment & Plan: Visit Diagnoses: No diagnosis found.  Plan:  Follow-Up Instructions: No follow-ups on file.   Remaining sutures were removed.  He will begin working on getting shoes and orthotics.  Prescription was provided.  Follow-up in 3 weeks.  I have told him that when he is at dialysis he needs to have a pillow under his leg to evaluate his heel and foot to hang free  Ortho Exam  Patient is alert, oriented, no adenopathy, well-dressed, normal affect, normal respiratory effort. Interval improvement at the end of the transmetatarsal amputation stump he does still have some superficial necrosis of the skin on the dorsal side of the incision centrally.  But incision is well opposed sutures are loose and were easily removed.  His heel there is no cellulitis no open ulcer he has some irritation and tenderness to palpation no fluctuance or signs of infection Imaging: No results found. No images are attached to the encounter.  Labs: Lab Results  Component Value Date   HGBA1C 6.4 (H) 07/24/2019   HGBA1C 6.5 (H) 07/18/2018   HGBA1C 6.4 (H) 10/16/2017   ESRSEDRATE 22 (H) 07/24/2019   CRP <0.5 07/24/2019   REPTSTATUS 07/29/2019 FINAL  07/24/2019   CULT  07/24/2019    NO GROWTH 5 DAYS Performed at [facility], [place] [address] [place], [place] [address]      Lab Results  Component Value Date   ALBUMIN 3.1 (L) 12/01/2019   ALBUMIN 3.1 (L) 11/28/2019   ALBUMIN 3.1 (L) 11/26/2019   PREALBUMIN 28.0 07/24/2019    Lab Results  Component Value Date   MG 1.8 04/26/2019   MG 1.8 04/24/2019   MG 2.1 03/22/2017   No results found for: VD25OH  Lab Results  Component Value Date   PREALBUMIN 28.0 07/24/2019   CBC EXTENDED Latest Ref Rng & Units 02/10/2020 12/01/2019 11/28/2019  WBC 4.0 - 10.5 K/uL - 8.3 9.2  RBC 4.22 - 5.81 MIL/uL - 3.65(L) 3.74(L)  HGB 13.0 - 17.0 g/dL 13.9 10.7(L) 11.2(L)  HCT 39 - 52 % 41.0 33.9(L) 35.3(L)  PLT 150 - 400 K/uL - 329 254  NEUTROABS 1.7 - 7.7 K/uL - - -  LYMPHSABS 0.7 - 4.0 K/uL - - -     Body mass index is 21.9 kg/m.  Orders:  No orders of the defined types were placed in this encounter.  No orders of the defined types were placed in this encounter.    Procedures: No procedures performed  Clinical Data: No additional findings.  ROS:  All other systems negative, except as noted in the HPI. Review of Systems  Objective: Vital Signs: Ht 5\' 11"  (1.803 m)   Wt 157 lb (71.2 kg)   BMI 21.90 kg/m   Specialty Comments:  No specialty comments available.  PMFS History: Patient Active Problem List   Diagnosis Date Noted  . Right below-knee amputee ([place]) 11/25/2019  . S/P transmetatarsal amputation of foot, right ([place]) 11/25/2019  . Gangrene of right foot ([place]) 11/19/2019  . History of partial ray amputation of fifth toe of right foot ([place]) 09/09/2019  . Ischemic ulcer diabetic foot ([place]) 09/09/2019  . Subacute osteomyelitis, right ankle and foot ([place])   . Diabetic foot infection ([place]) 07/24/2019  . Chronic diastolic CHF (congestive heart failure) ([place]) 07/24/2019  . Diabetic ulcer of right midfoot associated with diabetes mellitus due to underlying  condition, with fat layer exposed ([place])   . Gastroenteritis 04/24/2019  . Allergy, unspecified, initial encounter 01/27/2019  . DM neuropathy with neurologic complication ([place]) 19/41/7408  . GERD (gastroesophageal reflux disease) 02/28/2018  . Nephrolithiasis 02/28/2018  . Sciatic leg pain 02/28/2018  . Snores 02/28/2018  . Orthopnea 02/23/2018  . Elevated troponin 02/23/2018  . HNP (herniated nucleus pulposus), lumbar 07/03/2017  . Encounter for removal of sutures 06/01/2017  . Chest pain in adult 04/27/2017  . Restless leg syndrome, uncontrolled 04/27/2017  . 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 Anemia of chronic disease 03/17/2017  . ESRD (end stage renal disease) on dialysis ([place]) 03/17/2017  . Chronic chest pain 03/17/2017  . Type II diabetes mellitus ([place]) 03/17/2017  . Acute on chronic diastolic heart failure ([place]) 03/17/2017  . Acute heart failure ([place]) 03/17/2017  . Lumbar radiculopathy 01/16/2017  . Pre-transplant evaluation for kidney transplant 06/15/2016  . Pain, unspecified 04/17/2016  . Increased frequency of urination 12/17/2015  . Nocturia 12/17/2015  . Chest pain, non-cardiac 10/09/2015  . Hypercalcemia 05/10/2015  . Other fluid overload 02/24/2015  . 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 Fatty (change of) liver, not elsewhere classified 07/02/2014  . Aftercare including intermittent dialysis ([place]) 06/24/2014  . Diarrhea, unspecified 06/24/2014  . Fever, unspecified 06/24/2014  . Iron deficiency anemia, unspecified 06/24/2014  . Other specified coagulation defects ([place]) 06/24/2014  . Pruritus, unspecified 06/24/2014  . Secondary hyperparathyroidism of renal origin ([place]) 06/24/2014  . Type 2 diabetes mellitus with diabetic peripheral angiopathy without gangrene ([place]) 06/24/2014  . Acute on chronic renal failure ([place]) 06/16/2014  . Shoulder pain, left 12/15/2013  . Chest pain 12/15/2013  . Claudication  ([place]) 12/11/2013  . PVD (peripheral vascular disease) ([place]) 12/11/2013  . Carotid artery disease ([place]) 09/30/2013  . Acute chest pain 11/15/2012  . Bruit 09/15/2010  . CAD (coronary artery disease) nonobstructive per cath 2012   . Hyperlipidemia LDL goal <70 07/22/2009  . Essential hypertension 07/22/2009   Past Medical History:  Diagnosis Date  . Anemia of chronic disease   . CAD (coronary artery disease)    a.  Myoview 4/11: EF 53%, no scar or ischemia   c. MV 2012 Nl perfusion, apical thinning.  No ischemia or scar.  EF 49%, appears greater by visual estimate.;  d.  Dob stress echo 12/13:  Negative Dob stress echo. There is no evidence of ischemia.  The LVF is normal. b. Normal cors 2016.  . Carotid stenosis    a. <14% RICA, >48% LICA by duplex 04/8561  . Chronic chest pain  occ  . Constipation    chronic  . Dyspnea    occasional with extertion  . ESRD (end stage renal disease) on dialysis [facility])    M-W-F- [name]  . GERD (gastroesophageal reflux disease)   . History of kidney stones    Passed  . HNP (herniated nucleus pulposus), lumbar   . HTN (hypertension)    echo 3/10: EF 60%, LAE  . Hyperlipidemia   . Nephrolithiasis    "passed them all"  . Peripheral arterial disease ([place])    a. s/p PTCA  right   . Pneumonia yrs ago  . Restless legs   . Sciatic leg pain   . Sleep apnea    does not use cpap  . Snores    a. presumed OSA, pt has refused sleep eval in past.  . Type II diabetes mellitus ([place])    no longer on medications, checks blood glucose at home  . Urinary frequency   . Uses wheelchair   . Walker as ambulation aid     Family History  Problem Relation Age of Onset  . Heart attack Sister        died @ 46  . Cancer Mother        died @ 65; unknown type  . Diabetes Brother        deceased  . Cirrhosis Father        alcohol related  . Diabetes Father   . Esophageal cancer Neg Hx   . Colon cancer Neg Hx   . Pancreatic cancer Neg Hx   . Stomach cancer  Neg Hx     Past Surgical History:  Procedure Laterality Date  . ABDOMINAL AORTOGRAM W/LOWER EXTREMITY Bilateral 08/21/2019   Procedure: ABDOMINAL AORTOGRAM W/LOWER EXTREMITY;  Surgeon: Waynetta Sandy, MD;  Location: [place];  Service: Cardiovascular;  Laterality: Bilateral;  . AMPUTATION Right 07/25/2019   Procedure: RIGHT 5th RAY AMPUTATION;  Surgeon: Newt Minion, MD;  Location: [place];  Service: Orthopedics;  Laterality: Right;  . AMPUTATION Right 11/19/2019   Procedure: RIGHT TRANSMETATARSAL AMPUTATION;  Surgeon: Newt Minion, MD;  Location: [facility];  Service: Orthopedics;  Laterality: Right;  . ANGIOPLASTY / STENTING FEMORAL Left 12/11/2013   dr berry  . AV FISTULA PLACEMENT Left 03/19/2014   Procedure: CREATION OF ARTERIOVENOUS (AV) FISTULA  LEFT UPPER ARM;  Surgeon: Mal Misty, MD;  Location: [place];  Service: Vascular;  Laterality: Left;  . BACK SURGERY  01/2018   screws placed   . CARDIAC CATHETERIZATION  2001 and 2010   . COLONOSCOPY W/ BIOPSIES AND POLYPECTOMY    . COLONOSCOPY WITH PROPOFOL N/A 08/01/2016   Procedure: COLONOSCOPY WITH PROPOFOL;  Surgeon: Carol Ada, MD;  [facility];  Service: Endoscopy;  Laterality: N/A;  . CORONARY ARTERY BYPASS GRAFT  2019   [facility] x [facility] bypass  . ESOPHAGOGASTRODUODENOSCOPY (EGD) WITH PROPOFOL N/A 08/01/2016   Procedure: ESOPHAGOGASTRODUODENOSCOPY (EGD) WITH PROPOFOL;  Surgeon: Carol Ada, MD;  [facility];  Service: Endoscopy;  Laterality: N/A;  . FISTULA SUPERFICIALIZATION Left 02/10/2020   Procedure: 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;  Surgeon: Rosetta Posner, MD;  Location: [place];  Service: Vascular;  Laterality: Left;  . FOOT FRACTURE SURGERY Right    ligament repair  . FRACTURE SURGERY     left forearm  . GRAFT APPLICATION Right 10/16/5883   Procedure: FAT GRAFT APPLICATION;  Surgeon: Evelina Bucy, DPM;  Location[facility];  Service: Podiatry;   Laterality: Right;  . INGUINAL HERNIA REPAIR Left   . LEFT HEART CATH AND CORS/GRAFTS ANGIOGRAPHY N/A 04/27/2017   Procedure: LEFT HEART CATH AND CORS/GRAFTS ANGIOGRAPHY;  Surgeon: Leonie Man, MD;  Location: [facility];  Service: Cardiovascular;  Laterality: N/A;  . LEFT HEART CATHETERIZATION WITH CORONARY ANGIOGRAM N/A 06/22/2014   Procedure: LEFT HEART CATHETERIZATION WITH CORONARY ANGIOGRAM;  Surgeon: Troy Sine, MD;  Location: [facility];  Service: Cardiovascular;  Laterality: N/A;  . LOWER EXTREMITY ANGIOGRAM Left 12/11/2013   Procedure: LOWER EXTREMITY ANGIOGRAM;  Surgeon: Lorretta Harp, MD;  Location: [facility];  Service: Cardiovascular;  Laterality: Left;  . LUMBAR LAMINECTOMY/DECOMPRESSION MICRODISCECTOMY Right 07/03/2017   Procedure: MICRODISCECTOMY LUMBAR FIVE - SACRAL ONE RIGHT;  Surgeon: Consuella Lose, MD;  Location: [facility];  Service: Neurosurgery;  Laterality: Right;  . LUMBAR LAMINECTOMY/DECOMPRESSION MICRODISCECTOMY Right 10/19/2017   Procedure: MICRODISCECTOMY LUMBAR FIVE- SACRAL 1 ONE ;  Surgeon: Consuella Lose, MD;  Location: [facility];  Service: Neurosurgery;  Laterality: Right;  . TONSILLECTOMY AND ADENOIDECTOMY    . WISDOM TOOTH EXTRACTION    . WOUND DEBRIDEMENT Right 05/13/2019   Procedure: DEBRIDEMENT WOUND;  Surgeon: Evelina Bucy, DPM;  Location: [facility];  Service: Podiatry;  Laterality: Right;   Social History   Occupational History  . Occupation: retired  Tobacco Use  . Smoking status: Former Smoker    Packs/day: 1.00    Years: 2.00    Pack years: 2.00    Types: Cigarettes    Quit date: 1978    Years since quitting: 43.8  . Smokeless tobacco: Never Used  Vaping Use  . Vaping Use: Never used  Substance and Sexual Activity  . Alcohol use: Not Currently    Alcohol/week: 0.0 standard drinks    Comment: h/o social drinking  . Drug use: Not Currently    Types: Marijuana    Comment: quit 1986  . Sexual activity: Yes     Birth control/protection: None

## 2020-02-20 ENCOUNTER — Encounter: Payer: Self-pay | Admitting: Orthopedic Surgery

## 2020-02-20 NOTE — Progress Notes (Signed)
Office Visit Note   Patient: Jon Anderson           Date of Birth: Aug 16, 1950           MRN: [id number] Visit Date: 02/05/2020              Requested by: [name], MD [id number] [place],  [place] [address] PCP: [name], MD  Chief Complaint  Patient presents with  . Right Foot - Routine Post Op    11/19/19 right transmet amputation       HPI: Patient is a 69 year old gentleman who is 3 months status post a right transmetatarsal amputation.  He is currently wearing a compression stocking and using a nitroglycerin patch.  He states he has pain on the top of his foot  Patient is currently on Plavix using a nitroglycerin patch to improve microcirculation.  He has end-stage renal disease on dialysis.  Assessment & Plan: Visit Diagnoses:  1. History of transmetatarsal amputation of right foot ([place])     Plan: Recommended patient float his heel off the ground continue with elevation protected weightbearing prescription for Percocet provided.  Follow-Up Instructions: Return in about 2 weeks (around 02/19/2020).   Ortho Exam  Patient is alert, oriented, no adenopathy, well-dressed, normal affect, normal respiratory effort. Examination the ulcer over the anterior tibial tendon has healed.  Patient has ischemic changes to the incision and this is stable.  Patient is at risk for developing decubitus heel ulcers the importance of floating his heel was discussed.  Imaging: No results found. No images are attached to the encounter.  Labs: Lab Results  Component Value Date   HGBA1C 6.4 (H) 07/24/2019   HGBA1C 6.5 (H) 07/18/2018   HGBA1C 6.4 (H) 10/16/2017   ESRSEDRATE 22 (H) 07/24/2019   CRP <0.5 07/24/2019   REPTSTATUS 07/29/2019 FINAL 07/24/2019   CULT  07/24/2019    NO GROWTH 5 DAYS Performed at [facility], [place] [address], [place], [place] [address]      Lab Results  Component Value Date   ALBUMIN 3.1 (L) 12/01/2019   ALBUMIN 3.1 (L) 11/28/2019     ALBUMIN 3.1 (L) 11/26/2019   PREALBUMIN 28.0 07/24/2019    Lab Results  Component Value Date   MG 1.8 04/26/2019   MG 1.8 04/24/2019   MG 2.1 03/22/2017   No results found for: VD25OH  Lab Results  Component Value Date   PREALBUMIN 28.0 07/24/2019   CBC EXTENDED Latest Ref Rng & Units 02/10/2020 12/01/2019 11/28/2019  WBC 4.0 - 10.5 K/uL - 8.3 9.2  RBC 4.22 - 5.81 MIL/uL - 3.65(L) 3.74(L)  HGB 13.0 - 17.0 g/dL 13.9 10.7(L) 11.2(L)  HCT 39 - 52 % 41.0 33.9(L) 35.3(L)  PLT 150 - 400 K/uL - 329 254  NEUTROABS 1.7 - 7.7 K/uL - - -  LYMPHSABS 0.7 - 4.0 K/uL - - -     Body mass index is 21.9 kg/m.  Orders:  No orders of the defined types were placed in this encounter.  Meds ordered this encounter  Medications  . oxyCODONE-acetaminophen (PERCOCET/ROXICET) 5-325 MG tablet    Sig: Take 1 tablet by mouth every 4 (four) hours as needed for severe pain.    Dispense:  30 tablet    Refill:  0     Procedures: No procedures performed  Clinical Data: No additional findings.  ROS:  All other systems negative, except as noted in the HPI. Review of Systems  Objective: Vital Signs:  Ht 5\' 11"  (1.803 m)   Wt 157 lb (71.2 kg)   BMI 21.90 kg/m   Specialty Comments:  No specialty comments available.  PMFS History: Patient Active Problem List   Diagnosis Date Noted  . Right below-knee amputee ([place]) 11/25/2019  . S/P transmetatarsal amputation of foot, right ([place]) 11/25/2019  . Gangrene of right foot ([place]) 11/19/2019  . History of partial ray amputation of fifth toe of right foot ([place]) 09/09/2019  . Ischemic ulcer diabetic foot ([place]) 09/09/2019  . Subacute osteomyelitis, right ankle and foot ([place])   . Diabetic foot infection ([place]) 07/24/2019  . Chronic diastolic CHF (congestive heart failure) ([place]) 07/24/2019  . Diabetic ulcer of right midfoot associated with diabetes mellitus due to underlying condition, with fat layer exposed ([place])   . Gastroenteritis 04/24/2019  .  Allergy, unspecified, initial encounter 01/27/2019  . DM neuropathy with neurologic complication ([place]) 44/31/5400  . GERD (gastroesophageal reflux disease) 02/28/2018  . Nephrolithiasis 02/28/2018  . Sciatic leg pain 02/28/2018  . Snores 02/28/2018  . Orthopnea 02/23/2018  . Elevated troponin 02/23/2018  . HNP (herniated nucleus pulposus), lumbar 07/03/2017  . Encounter for removal of sutures 06/01/2017  . Chest pain in adult 04/27/2017  . Restless leg syndrome, uncontrolled 04/27/2017  . 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 Anemia of chronic disease 03/17/2017  . ESRD (end stage renal disease) on dialysis ([place]) 03/17/2017  . Chronic chest pain 03/17/2017  . Type II diabetes mellitus ([place]) 03/17/2017  . Acute on chronic diastolic heart failure ([place]) 03/17/2017  . Acute heart failure ([place]) 03/17/2017  . Lumbar radiculopathy 01/16/2017  . Pre-transplant evaluation for kidney transplant 06/15/2016  . Pain, unspecified 04/17/2016  . Increased frequency of urination 12/17/2015  . Nocturia 12/17/2015  . Chest pain, non-cardiac 10/09/2015  . Hypercalcemia 05/10/2015  . Other fluid overload 02/24/2015  . 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 Fatty (change of) liver, not elsewhere classified 07/02/2014  . Aftercare including intermittent dialysis ([place]) 06/24/2014  . Diarrhea, unspecified 06/24/2014  . Fever, unspecified 06/24/2014  . Iron deficiency anemia, unspecified 06/24/2014  . 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 Pruritus, unspecified 06/24/2014  . Secondary hyperparathyroidism of renal origin ([place]) 06/24/2014  . Type 2 diabetes mellitus with diabetic peripheral angiopathy without gangrene ([place]) 06/24/2014  . Acute on chronic renal failure ([place]) 06/16/2014  . Shoulder pain, left 12/15/2013  . Chest pain 12/15/2013  . Claudication ([employer]) 12/11/2013  . PVD (peripheral vascular disease) ([place]) 12/11/2013  .  Carotid artery disease ([place]) 09/30/2013  . Acute chest pain 11/15/2012  . Bruit 09/15/2010  . CAD (coronary artery disease) nonobstructive per cath 2012   . Hyperlipidemia LDL goal <70 07/22/2009  . Essential hypertension 07/22/2009   Past Medical History:  Diagnosis Date  . Anemia of chronic disease   . CAD (coronary artery disease)    a.  Myoview 4/11: EF 53%, no scar or ischemia   c. MV 2012 Nl perfusion, apical thinning.  No ischemia or scar.  EF 49%, appears greater by visual estimate.;  d.  Dob stress echo 12/13:  Negative Dob stress echo. There is no evidence of ischemia.  The LVF is normal. b. Normal cors 2016.  . Carotid stenosis    a. <86% RICA, >76% LICA by duplex 04/9507  . Chronic chest pain    occ  . Constipation    chronic  . Dyspnea    occasional with extertion  .  ESRD (end stage renal disease) on dialysis [facility])    M-W-F- [name]  . GERD (gastroesophageal reflux disease)   . History of kidney stones    Passed  . HNP (herniated nucleus pulposus), lumbar   . HTN (hypertension)    echo 3/10: EF 60%, LAE  . Hyperlipidemia   . Nephrolithiasis    "passed them all"  . Peripheral arterial disease ([place])    a. s/p PTCA  right   . Pneumonia yrs ago  . Restless legs   . Sciatic leg pain   . Sleep apnea    does not use cpap  . Snores    a. presumed OSA, pt has refused sleep eval in past.  . Type II diabetes mellitus ([place])    no longer on medications, checks blood glucose at home  . Urinary frequency   . Uses wheelchair   . Walker as ambulation aid     Family History  Problem Relation Age of Onset  . Heart attack Sister        died @ 10  . Cancer Mother        died @ 54; unknown type  . Diabetes Brother        deceased  . Cirrhosis Father        alcohol related  . Diabetes Father   . Esophageal cancer Neg Hx   . Colon cancer Neg Hx   . Pancreatic cancer Neg Hx   . Stomach cancer Neg Hx     Past Surgical History:  Procedure Laterality Date  . ABDOMINAL  AORTOGRAM W/LOWER EXTREMITY Bilateral 08/21/2019   Procedure: ABDOMINAL AORTOGRAM W/LOWER EXTREMITY;  Surgeon: Waynetta Sandy, MD;  Location: [facility];  Service: Cardiovascular;  Laterality: Bilateral;  . AMPUTATION Right 07/25/2019   Procedure: RIGHT 5th RAY AMPUTATION;  Surgeon: Newt Minion, MD;  Location: [place];  Service: Orthopedics;  Laterality: Right;  . AMPUTATION Right 11/19/2019   Procedure: RIGHT TRANSMETATARSAL AMPUTATION;  Surgeon: Newt Minion, MD;  Location: [place];  Service: Orthopedics;  Laterality: Right;  . ANGIOPLASTY / STENTING FEMORAL Left 12/11/2013   dr berry  . AV FISTULA PLACEMENT Left 03/19/2014   Procedure: CREATION OF ARTERIOVENOUS (AV) FISTULA  LEFT UPPER ARM;  Surgeon: Mal Misty, MD;  Location: [facility];  Service: Vascular;  Laterality: Left;  . BACK SURGERY  01/2018   screws placed   . CARDIAC CATHETERIZATION  2001 and 2010   . COLONOSCOPY W/ BIOPSIES AND POLYPECTOMY    . COLONOSCOPY WITH PROPOFOL N/A 08/01/2016   Procedure: COLONOSCOPY WITH PROPOFOL;  Surgeon: Carol Ada, MD;  [facility];  Service: Endoscopy;  Laterality: N/A;  . CORONARY ARTERY BYPASS GRAFT  2019   [facility] x [facility] bypass  . ESOPHAGOGASTRODUODENOSCOPY (EGD) WITH PROPOFOL N/A 08/01/2016   Procedure: ESOPHAGOGASTRODUODENOSCOPY (EGD) WITH PROPOFOL;  Surgeon: Carol Ada, MD;  [facility];  Service: Endoscopy;  Laterality: N/A;  . FISTULA SUPERFICIALIZATION Left 02/10/2020   Procedure: 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;  Surgeon: Rosetta Posner, MD;  Location: [place] [facility];  Service: Vascular;  Laterality: Left;  . FOOT FRACTURE SURGERY Right    ligament repair  . FRACTURE SURGERY     left forearm  . GRAFT APPLICATION Right 10/13/1605   Procedure: FAT GRAFT APPLICATION;  Surgeon: Evelina Bucy, DPM;  Location: [facility];  Service: Podiatry;  Laterality: Right;  . INGUINAL HERNIA REPAIR Left   . LEFT HEART CATH  AND  CORS/GRAFTS ANGIOGRAPHY N/A 04/27/2017   Procedure: LEFT HEART CATH AND CORS/GRAFTS ANGIOGRAPHY;  Surgeon: Leonie Man, MD;  Location: [facility];  Service: Cardiovascular;  Laterality: N/A;  . LEFT HEART CATHETERIZATION WITH CORONARY ANGIOGRAM N/A 06/22/2014   Procedure: LEFT HEART CATHETERIZATION WITH CORONARY ANGIOGRAM;  Surgeon: Troy Sine, MD;  Location: [facility] LAB;  Service: Cardiovascular;  Laterality: N/A;  . LOWER EXTREMITY ANGIOGRAM Left 12/11/2013   Procedure: LOWER EXTREMITY ANGIOGRAM;  Surgeon: Lorretta Harp, MD;  Location: [facility] LAB;  Service: Cardiovascular;  Laterality: Left;  . LUMBAR LAMINECTOMY/DECOMPRESSION MICRODISCECTOMY Right 07/03/2017   Procedure: MICRODISCECTOMY LUMBAR FIVE - SACRAL ONE RIGHT;  Surgeon: Consuella Lose, MD;  Location: [facility];  Service: Neurosurgery;  Laterality: Right;  . LUMBAR LAMINECTOMY/DECOMPRESSION MICRODISCECTOMY Right 10/19/2017   Procedure: MICRODISCECTOMY LUMBAR FIVE- SACRAL 1 ONE ;  Surgeon: Consuella Lose, MD;  Location: [facility];  Service: Neurosurgery;  Laterality: Right;  . TONSILLECTOMY AND ADENOIDECTOMY    . WISDOM TOOTH EXTRACTION    . WOUND DEBRIDEMENT Right 05/13/2019   Procedure: DEBRIDEMENT WOUND;  Surgeon: Evelina Bucy, DPM;  Location: [facility];  Service: Podiatry;  Laterality: Right;   Social History   Occupational History  . Occupation: retired  Tobacco Use  . Smoking status: Former Smoker    Packs/day: 1.00    Years: 2.00    Pack years: 2.00    Types: Cigarettes    Quit date: 1978    Years since quitting: 43.8  . Smokeless tobacco: Never Used  Vaping Use  . Vaping Use: Never used  Substance and Sexual Activity  . Alcohol use: Not Currently    Alcohol/week: 0.0 standard drinks    Comment: h/o social drinking  . Drug use: Not Currently    Types: Marijuana    Comment: quit 1986  . Sexual activity: Yes    Birth control/protection: None

## 2020-03-11 ENCOUNTER — Ambulatory Visit (INDEPENDENT_AMBULATORY_CARE_PROVIDER_SITE_OTHER): Payer: Medicare Other | Admitting: Orthopedic Surgery

## 2020-03-11 ENCOUNTER — Encounter: Payer: Self-pay | Admitting: Orthopedic Surgery

## 2020-03-11 VITALS — Ht 71.0 in | Wt 157.0 lb

## 2020-03-11 DIAGNOSIS — Z89431 Acquired absence of right foot: Secondary | ICD-10-CM

## 2020-03-11 MED ORDER — OXYCODONE-ACETAMINOPHEN 5-325 MG PO TABS: 1.0000 | ORAL_TABLET | Freq: Four times a day (QID) | ORAL | 0 refills | Status: DC | PRN

## 2020-03-11 NOTE — Progress Notes (Signed)
Office Visit Note   Patient: Jon Anderson           Date of Birth: 08-Dec-1950           MRN: [id number] Visit Date: 03/11/2020              Requested by: [name], MD [address] [place],  [place] [address] PCP: [name], MD  Chief Complaint  Patient presents with  . Right Foot - Routine Post Op    11/19/19 right transmet amputation       HPI: Patient is a 69 year old gentleman who presents in follow-up status post right transmetatarsal amputation.  Patient is almost 4 months out from surgery.  Patient states he still has neuropathic pain burning in the right foot he states he still develops calluses on the left foot.  He is on Neurontin.  Assessment & Plan: Visit Diagnoses:  1. History of transmetatarsal amputation of right foot ([place])     Plan: Recommend that he [facility] foot care in the left he has been fit for orthotics and will obtain the shoes and inserts.  He will continue with a nitroglycerin patch and Trental for a month.  Follow-Up Instructions: Return if symptoms worsen or fail to improve.   Ortho Exam  Patient is alert, oriented, no adenopathy, well-dressed, normal affect, normal respiratory effort. Examination the right transmetatarsal amputation has healed well the incision is completely healed there is no redness no cellulitis no drainage his foot is plantigrade there are no calluses no plantar ulcers.  Patient has completed his course of doxycycline.  Imaging: No results found. No images are attached to the encounter.  Labs: Lab Results  Component Value Date   HGBA1C 6.4 (H) 07/24/2019   HGBA1C 6.5 (H) 07/18/2018   HGBA1C 6.4 (H) 10/16/2017   ESRSEDRATE 22 (H) 07/24/2019   CRP <0.5 07/24/2019   REPTSTATUS 07/29/2019 FINAL 07/24/2019   CULT  07/24/2019    NO GROWTH 5 DAYS Performed at [facility], [place] [address], [place], [place] [address]      Lab Results  Component Value Date   ALBUMIN 3.1  (L) 12/01/2019   ALBUMIN 3.1 (L) 11/28/2019   ALBUMIN 3.1 (L) 11/26/2019   PREALBUMIN 28.0 07/24/2019    Lab Results  Component Value Date   MG 1.8 04/26/2019   MG 1.8 04/24/2019   MG 2.1 03/22/2017   No results found for: VD25OH  Lab Results  Component Value Date   PREALBUMIN 28.0 07/24/2019   CBC EXTENDED Latest Ref Rng & Units 02/10/2020 12/01/2019 11/28/2019  WBC 4.0 - 10.5 K/uL - 8.3 9.2  RBC 4.22 - 5.81 MIL/uL - 3.65(L) 3.74(L)  HGB 13.0 - 17.0 g/dL 13.9 10.7(L) 11.2(L)  HCT 39 - 52 % 41.0 33.9(L) 35.3(L)  PLT 150 - 400 K/uL - 329 254  NEUTROABS 1.7 - 7.7 K/uL - - -  LYMPHSABS 0.7 - 4.0 K/uL - - -     Body mass index is 21.9 kg/m.  Orders:  No orders of the defined types were placed in this encounter.  Meds ordered this encounter  Medications  . oxyCODONE-acetaminophen (PERCOCET/ROXICET) 5-325 MG tablet    Sig: Take 1 tablet by mouth every 6 (six) hours as needed for severe pain.    Dispense:  20 tablet    Refill:  0     Procedures: No procedures performed  Clinical Data: No additional findings.  ROS:  All other systems  negative, except as noted in the HPI. Review of Systems  Objective: Vital Signs: Ht 5\' 11"  (1.803 m)   Wt 157 lb (71.2 kg)   BMI 21.90 kg/m   Specialty Comments:  No specialty comments available.  PMFS History: Patient Active Problem List   Diagnosis Date Noted  . Right below-knee amputee ([place]) 11/25/2019  . S/P transmetatarsal amputation of foot, right ([place]) 11/25/2019  . Gangrene of right foot ([place]) 11/19/2019  . History of partial ray amputation of fifth toe of right foot ([place]) 09/09/2019  . Ischemic ulcer diabetic foot ([place]) 09/09/2019  . Subacute osteomyelitis, right ankle and foot ([place])   . Diabetic foot infection ([place]) 07/24/2019  . Chronic diastolic CHF (congestive heart failure) ([place]) 07/24/2019  . Diabetic ulcer of right midfoot associated with diabetes mellitus due to underlying condition, with fat layer exposed  ([place])   . Gastroenteritis 04/24/2019  . Allergy, unspecified, initial encounter 01/27/2019  . DM neuropathy with neurologic complication ([place]) 40/34/7425  . GERD (gastroesophageal reflux disease) 02/28/2018  . Nephrolithiasis 02/28/2018  . Sciatic leg pain 02/28/2018  . Snores 02/28/2018  . Orthopnea 02/23/2018  . Elevated troponin 02/23/2018  . HNP (herniated nucleus pulposus), lumbar 07/03/2017  . Encounter for removal of sutures 06/01/2017  . Chest pain in adult 04/27/2017  . Restless leg syndrome, uncontrolled 04/27/2017  . 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 Anemia of chronic disease 03/17/2017  . ESRD (end stage renal disease) on dialysis ([place]) 03/17/2017  . Chronic chest pain 03/17/2017  . Type II diabetes mellitus ([place]) 03/17/2017  . Acute on chronic diastolic heart failure ([place]) 03/17/2017  . Acute heart failure ([place]) 03/17/2017  . Lumbar radiculopathy 01/16/2017  . Pre-transplant evaluation for kidney transplant 06/15/2016  . Pain, unspecified 04/17/2016  . Increased frequency of urination 12/17/2015  . Nocturia 12/17/2015  . Chest pain, non-cardiac 10/09/2015  . Hypercalcemia 05/10/2015  . Other fluid overload 02/24/2015  . 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 Fatty (change of) liver, not elsewhere classified 07/02/2014  . Aftercare including intermittent dialysis ([place]) 06/24/2014  . Diarrhea, unspecified 06/24/2014  . Fever, unspecified 06/24/2014  . Iron deficiency anemia, unspecified 06/24/2014  . Other specified coagulation defects ([place]) 06/24/2014  . Pruritus, unspecified 06/24/2014  . Secondary hyperparathyroidism of renal origin ([place]) 06/24/2014  . Type 2 diabetes mellitus with diabetic peripheral angiopathy without gangrene ([place]) 06/24/2014  . Acute on chronic renal failure ([place]) 06/16/2014  . Shoulder pain, left 12/15/2013  . Chest pain 12/15/2013  . Claudication ([place]) 12/11/2013  . PVD (peripheral  vascular disease) ([place]) 12/11/2013  . Carotid artery disease ([place]) 09/30/2013  . Acute chest pain 11/15/2012  . Bruit 09/15/2010  . CAD (coronary artery disease) nonobstructive per cath 2012   . Hyperlipidemia LDL goal <70 07/22/2009  . Essential hypertension 07/22/2009   Past Medical History:  Diagnosis Date  . Anemia of chronic disease   . CAD (coronary artery disease)    a.  Myoview 4/11: EF 53%, no scar or ischemia   c. MV 2012 Nl perfusion, apical thinning.  No ischemia or scar.  EF 49%, appears greater by visual estimate.;  d.  Dob stress echo 12/13:  Negative Dob stress echo. There is no evidence of ischemia.  The LVF is normal. b. Normal cors 2016.  . Carotid stenosis    a. <95% RICA, >63% LICA by duplex 11/7562  . Chronic chest pain    occ  . Constipation  chronic  . Dyspnea    occasional with extertion  . ESRD (end stage renal disease) on dialysis [facility])    M-W-F- [name]  . GERD (gastroesophageal reflux disease)   . History of kidney stones    Passed  . HNP (herniated nucleus pulposus), lumbar   . HTN (hypertension)    echo 3/10: EF 60%, LAE  . Hyperlipidemia   . Nephrolithiasis    "passed them all"  . Peripheral arterial disease ([place])    a. s/p PTCA  right   . Pneumonia yrs ago  . Restless legs   . Sciatic leg pain   . Sleep apnea    does not use cpap  . Snores    a. presumed OSA, pt has refused sleep eval in past.  . Type II diabetes mellitus ([place])    no longer on medications, checks blood glucose at home  . Urinary frequency   . Uses wheelchair   . Walker as ambulation aid     Family History  Problem Relation Age of Onset  . Heart attack Sister        died @ 75  . Cancer Mother        died @ 40; unknown type  . Diabetes Brother        deceased  . Cirrhosis Father        alcohol related  . Diabetes Father   . Esophageal cancer Neg Hx   . Colon cancer Neg Hx   . Pancreatic cancer Neg Hx   . Stomach cancer Neg Hx     Past Surgical History:   Procedure Laterality Date  . ABDOMINAL AORTOGRAM W/LOWER EXTREMITY Bilateral 08/21/2019   Procedure: ABDOMINAL AORTOGRAM W/LOWER EXTREMITY;  Surgeon: Waynetta Sandy, MD;  Location: [place] [facility];  Service: Cardiovascular;  Laterality: Bilateral;  . AMPUTATION Right 07/25/2019   Procedure: RIGHT 5th RAY AMPUTATION;  Surgeon: Newt Minion, MD;  Location: [place];  Service: Orthopedics;  Laterality: Right;  . AMPUTATION Right 11/19/2019   Procedure: RIGHT TRANSMETATARSAL AMPUTATION;  Surgeon: Newt Minion, MD;  Location: [place];  Service: Orthopedics;  Laterality: Right;  . ANGIOPLASTY / STENTING FEMORAL Left 12/11/2013   dr berry  . AV FISTULA PLACEMENT Left 03/19/2014   Procedure: CREATION OF ARTERIOVENOUS (AV) FISTULA  LEFT UPPER ARM;  Surgeon: Mal Misty, MD;  Location: [place];  Service: Vascular;  Laterality: Left;  . BACK SURGERY  01/2018   screws placed   . CARDIAC CATHETERIZATION  2001 and 2010   . COLONOSCOPY W/ BIOPSIES AND POLYPECTOMY    . COLONOSCOPY WITH PROPOFOL N/A 08/01/2016   Procedure: COLONOSCOPY WITH PROPOFOL;  Surgeon: Carol Ada, MD;  Location: [facility];  Service: Endoscopy;  Laterality: N/A;  . CORONARY ARTERY BYPASS GRAFT  2019   [facility] x 1 bypass  . ESOPHAGOGASTRODUODENOSCOPY (EGD) WITH PROPOFOL N/A 08/01/2016   Procedure: ESOPHAGOGASTRODUODENOSCOPY (EGD) WITH PROPOFOL;  Surgeon: Carol Ada, MD;  Location: [facility];  Service: Endoscopy;  Laterality: N/A;  . FISTULA SUPERFICIALIZATION Left 02/10/2020   Procedure: 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;  Surgeon: Rosetta Posner, MD;  Location: [place];  Service: Vascular;  Laterality: Left;  . FOOT FRACTURE SURGERY Right    ligament repair  . FRACTURE SURGERY     left forearm  . GRAFT APPLICATION Right 09/16/6293   Procedure: FAT GRAFT APPLICATION;  Surgeon: Evelina Bucy, DPM;  Location: [place];  Service: Podiatry;  Laterality: Right;  .  INGUINAL HERNIA  REPAIR Left   . LEFT HEART CATH AND CORS/GRAFTS ANGIOGRAPHY N/A 04/27/2017   Procedure: LEFT HEART CATH AND CORS/GRAFTS ANGIOGRAPHY;  Surgeon: Leonie Man, MD;  Location: [place] [facility];  Service: Cardiovascular;  Laterality: N/A;  . LEFT HEART CATHETERIZATION WITH CORONARY ANGIOGRAM N/A 06/22/2014   Procedure: LEFT HEART CATHETERIZATION WITH CORONARY ANGIOGRAM;  Surgeon: Troy Sine, MD;  Location: [facility] CATH LAB;  Service: Cardiovascular;  Laterality: N/A;  . LOWER EXTREMITY ANGIOGRAM Left 12/11/2013   Procedure: LOWER EXTREMITY ANGIOGRAM;  Surgeon: Lorretta Harp, MD;  Location: [facility] LAB;  Service: Cardiovascular;  Laterality: Left;  . LUMBAR LAMINECTOMY/DECOMPRESSION MICRODISCECTOMY Right 07/03/2017   Procedure: MICRODISCECTOMY LUMBAR FIVE - SACRAL ONE RIGHT;  Surgeon: Consuella Lose, MD;  Location: [place];  Service: Neurosurgery;  Laterality: Right;  . LUMBAR LAMINECTOMY/DECOMPRESSION MICRODISCECTOMY Right 10/19/2017   Procedure: MICRODISCECTOMY LUMBAR FIVE- SACRAL 1 ONE ;  Surgeon: Consuella Lose, MD;  Location: [place];  Service: Neurosurgery;  Laterality: Right;  . TONSILLECTOMY AND ADENOIDECTOMY    . WISDOM TOOTH EXTRACTION    . WOUND DEBRIDEMENT Right 05/13/2019   Procedure: DEBRIDEMENT WOUND;  Surgeon: Evelina Bucy, DPM;  Location: [facility];  Service: Podiatry;  Laterality: Right;   Social History   Occupational History  . Occupation: retired  Tobacco Use  . Smoking status: Former Smoker    Packs/day: 1.00    Years: 2.00    Pack years: 2.00    Types: Cigarettes    Quit date: 1978    Years since quitting: 43.9  . Smokeless tobacco: Never Used  Vaping Use  . Vaping Use: Never used  Substance and Sexual Activity  . Alcohol use: Not Currently    Alcohol/week: 0.0 standard drinks    Comment: h/o social drinking  . Drug use: Not Currently    Types: Marijuana    Comment: quit 1986  . Sexual activity: Yes    Birth control/protection: None

## 2020-03-18 ENCOUNTER — Encounter (HOSPITAL_COMMUNITY): Payer: Medicare Other

## 2020-03-18 ENCOUNTER — Other Ambulatory Visit: Payer: Self-pay

## 2020-03-18 ENCOUNTER — Ambulatory Visit (INDEPENDENT_AMBULATORY_CARE_PROVIDER_SITE_OTHER): Payer: Self-pay | Admitting: Physician Assistant

## 2020-03-18 VITALS — BP 82/49 | HR 97 | Temp 97.5°F | Resp 20 | Ht 71.0 in | Wt 156.0 lb

## 2020-03-18 DIAGNOSIS — N186 End stage renal disease: Secondary | ICD-10-CM

## 2020-03-18 DIAGNOSIS — Z992 Dependence on renal dialysis: Secondary | ICD-10-CM

## 2020-03-18 NOTE — Progress Notes (Signed)
POST OPERATIVE OFFICE NOTE    CC:  F/u for surgery  HPI:  This is a 69 y.o. male who is s/p distal aneurysm plication on 54/0/98 by Dr. Donnetta Hutching.    Pt returns today for follow up.  He denise pain, loss of sensation or loss of motor.  He is now asking when we can schedule him for plication of the proximal aneurysm.  He is trying to avoid the use of a TDC.      Allergies  Allergen Reactions  . Kiwi Extract Itching, Swelling and Other (See Comments)    Lips and face swell- breathing not affected  . Flexeril [Cyclobenzaprine]     Hands become flimsy, can not hold things  . Tape Other (See Comments)    "Plastic" tape causes blisters!!    Current Outpatient Medications  Medication Sig Dispense Refill  . acetaminophen (TYLENOL) 325 MG tablet Take 1-2 tablets (325-650 mg total) by mouth every 6 (six) hours as needed for mild pain (pain score 1-3 or temp > 100.5).    Marland Kitchen aspirin EC 81 MG tablet Take 1 tablet (81 mg total) by mouth daily.    Marland Kitchen atorvastatin (LIPITOR) 40 MG tablet Take 1 tablet (40 mg total) by mouth daily at 6 PM. 30 tablet 5  . calcitRIOL (ROCALTROL) 0.25 MCG capsule Take 5 capsules (1.25 mcg total) by mouth every Monday, Wednesday, and Friday with hemodialysis. (Patient taking differently: Take 0.25 mcg by mouth every Monday, Wednesday, and Friday with hemodialysis. Morning) 60 capsule 0  . calcium acetate (PHOSLO) 667 MG capsule Take 2 capsules (1,334 mg total) by mouth 2 (two) times daily with a meal. 180 capsule 0  . clopidogrel (PLAVIX) 75 MG tablet Take 1 tablet (75 mg total) by mouth daily. 30 tablet 0  . doxycycline (VIBRA-TABS) 100 MG tablet TAKE 1 TABLET BY MOUTH TWICE A DAY (Patient taking differently: Take 100 mg by mouth daily.) 60 tablet 0  . gabapentin (NEURONTIN) 100 MG capsule Take 1 capsule (100 mg total) by mouth 3 (three) times daily as needed. (Patient taking differently: Take 100 mg by mouth 3 (three) times daily.) 90 capsule 0  . lipase/protease/amylase  (CREON) 36000 UNITS CPEP capsule Take 1 capsule (36,000 Units total) by mouth daily. 180 capsule 0  . loperamide (IMODIUM) 2 MG capsule Take 2 mg by mouth as needed for diarrhea or loose stools.    . midodrine (PROAMATINE) 5 MG tablet Take 1 tablet (5 mg total) by mouth every Monday, Wednesday, and Friday. Take with Dialysis (Patient taking differently: Take 5 mg by mouth daily as needed (low blood pressure). Take with Dialysis) 60 tablet 0  . multivitamin (RENA-VIT) TABS tablet Take 1 tablet by mouth at bedtime. 30 tablet 0  . Naldemedine Tosylate 0.2 MG TABS Take 0.2 mg by mouth daily as needed (Constipation).     . nitroGLYCERIN (NITRODUR - DOSED IN MG/24 HR) 0.2 mg/hr patch Place 1 patch (0.2 mg total) onto the skin daily. Place on different areas of right forefoot daily 30 patch 12  . nitroGLYCERIN (NITROSTAT) 0.4 MG SL tablet Place 1 tablet (0.4 mg total) under the tongue every 5 (five) minutes as needed for chest pain. 25 tablet 3  . oxyCODONE-acetaminophen (PERCOCET/ROXICET) 5-325 MG tablet Take 1 tablet by mouth every 6 (six) hours as needed for severe pain. 20 tablet 0  . pantoprazole (PROTONIX) 40 MG tablet Take 1 tablet (40 mg total) by mouth daily. 30 tablet 0  . pentoxifylline (TRENTAL) 400 MG  CR tablet Take 1 tablet (400 mg total) by mouth daily. 90 tablet 3  . polyethylene glycol (MIRALAX / GLYCOLAX) packet Take 17 g by mouth daily as needed for moderate constipation. 14 each 0  . traMADol (ULTRAM) 50 MG tablet Take 1 tablet (50 mg total) by mouth every 6 (six) hours as needed for moderate pain. 30 tablet 0   No current facility-administered medications for this visit.     ROS:  See HPI  Physical Exam:    Incision:  Well healed plication distally.  He continues to have eroided skin over the proximal aneurysm and the skin is not mobile.  There is currently no active bleeding. Extremities:   Grip 5/5, sensation intact and palpable thrill in the fistula. Lungs: non labored  breathing Heart:  RRR  Assessment/Plan:  This is a 69 y.o. male who is s/p:distal aneurysm plication.  He is using the distal portion for HD currently without problems.  WE will plan proximal aneurysm plication now and they can use the distal half of the fistula for HD.  He wants to avoid [facility] placement if possible.  He does HD on MWF and is not on anticoagulation    Roxy Horseman PA-C [facility] [phone]  Clinic MD:  Oneida Alar

## 2020-03-18 NOTE — H&P (View-Only) (Signed)
POST OPERATIVE OFFICE NOTE    CC:  F/u for surgery  HPI:  This is a 69 y.o. male who is s/p distal aneurysm plication on 16/1/09 by Dr. Donnetta Hutching.    Pt returns today for follow up.  He denise pain, loss of sensation or loss of motor.  He is now asking when we can schedule him for plication of the proximal aneurysm.  He is trying to avoid the use of a TDC.      Allergies  Allergen Reactions  . Kiwi Extract Itching, Swelling and Other (See Comments)    Lips and face swell- breathing not affected  . Flexeril [Cyclobenzaprine]     Hands become flimsy, can not hold things  . Tape Other (See Comments)    "Plastic" tape causes blisters!!    Current Outpatient Medications  Medication Sig Dispense Refill  . acetaminophen (TYLENOL) 325 MG tablet Take 1-2 tablets (325-650 mg total) by mouth every 6 (six) hours as needed for mild pain (pain score 1-3 or temp > 100.5).    Marland Kitchen aspirin EC 81 MG tablet Take 1 tablet (81 mg total) by mouth daily.    Marland Kitchen atorvastatin (LIPITOR) 40 MG tablet Take 1 tablet (40 mg total) by mouth daily at 6 PM. 30 tablet 5  . calcitRIOL (ROCALTROL) 0.25 MCG capsule Take 5 capsules (1.25 mcg total) by mouth every Monday, Wednesday, and Friday with hemodialysis. (Patient taking differently: Take 0.25 mcg by mouth every Monday, Wednesday, and Friday with hemodialysis. Morning) 60 capsule 0  . calcium acetate (PHOSLO) 667 MG capsule Take 2 capsules (1,334 mg total) by mouth 2 (two) times daily with a meal. 180 capsule 0  . clopidogrel (PLAVIX) 75 MG tablet Take 1 tablet (75 mg total) by mouth daily. 30 tablet 0  . doxycycline (VIBRA-TABS) 100 MG tablet TAKE 1 TABLET BY MOUTH TWICE A DAY (Patient taking differently: Take 100 mg by mouth daily.) 60 tablet 0  . gabapentin (NEURONTIN) 100 MG capsule Take 1 capsule (100 mg total) by mouth 3 (three) times daily as needed. (Patient taking differently: Take 100 mg by mouth 3 (three) times daily.) 90 capsule 0  . lipase/protease/amylase  (CREON) 36000 UNITS CPEP capsule Take 1 capsule (36,000 Units total) by mouth daily. 180 capsule 0  . loperamide (IMODIUM) 2 MG capsule Take 2 mg by mouth as needed for diarrhea or loose stools.    . midodrine (PROAMATINE) 5 MG tablet Take 1 tablet (5 mg total) by mouth every Monday, Wednesday, and Friday. Take with Dialysis (Patient taking differently: Take 5 mg by mouth daily as needed (low blood pressure). Take with Dialysis) 60 tablet 0  . multivitamin (RENA-VIT) TABS tablet Take 1 tablet by mouth at bedtime. 30 tablet 0  . Naldemedine Tosylate 0.2 MG TABS Take 0.2 mg by mouth daily as needed (Constipation).     . nitroGLYCERIN (NITRODUR - DOSED IN MG/24 HR) 0.2 mg/hr patch Place 1 patch (0.2 mg total) onto the skin daily. Place on different areas of right forefoot daily 30 patch 12  . nitroGLYCERIN (NITROSTAT) 0.4 MG SL tablet Place 1 tablet (0.4 mg total) under the tongue every 5 (five) minutes as needed for chest pain. 25 tablet 3  . oxyCODONE-acetaminophen (PERCOCET/ROXICET) 5-325 MG tablet Take 1 tablet by mouth every 6 (six) hours as needed for severe pain. 20 tablet 0  . pantoprazole (PROTONIX) 40 MG tablet Take 1 tablet (40 mg total) by mouth daily. 30 tablet 0  . pentoxifylline (TRENTAL) 400 MG  CR tablet Take 1 tablet (400 mg total) by mouth daily. 90 tablet 3  . polyethylene glycol (MIRALAX / GLYCOLAX) packet Take 17 g by mouth daily as needed for moderate constipation. 14 each 0  . traMADol (ULTRAM) 50 MG tablet Take 1 tablet (50 mg total) by mouth every 6 (six) hours as needed for moderate pain. 30 tablet 0   No current facility-administered medications for this visit.     ROS:  See HPI  Physical Exam:    Incision:  Well healed plication distally.  He continues to have eroided skin over the proximal aneurysm and the skin is not mobile.  There is currently no active bleeding. Extremities:   Grip 5/5, sensation intact and palpable thrill in the fistula. Lungs: non labored  breathing Heart:  RRR  Assessment/Plan:  This is a 69 y.o. male who is s/p:distal aneurysm plication.  He is using the distal portion for HD currently without problems.  WE will plan proximal aneurysm plication now and they can use the distal half of the fistula for HD.  He wants to avoid [facility] placement if possible.  He does HD on MWF and is not on anticoagulation    Roxy Horseman PA-C [facility] [phone]  Clinic MD:  Oneida Alar

## 2020-03-20 ENCOUNTER — Other Ambulatory Visit (HOSPITAL_COMMUNITY)
Admission: RE | Admit: 2020-03-20 | Discharge: 2020-03-20 | Disposition: A | Discharge status: Home / Self Care | Payer: Medicare Other | Source: Ambulatory Visit | Attending: Vascular Surgery | Admitting: Vascular Surgery

## 2020-03-20 DIAGNOSIS — Z20822 Contact with and (suspected) exposure to covid-19: Secondary | ICD-10-CM | POA: Diagnosis not present

## 2020-03-20 DIAGNOSIS — Z01812 Encounter for preprocedural laboratory examination: Secondary | ICD-10-CM | POA: Insufficient documentation

## 2020-03-21 LAB — SARS CORONAVIRUS 2 (TAT 6-24 HRS): SARS Coronavirus 2: NEGATIVE

## 2020-03-22 ENCOUNTER — Encounter (HOSPITAL_COMMUNITY): Payer: Self-pay | Admitting: Vascular Surgery

## 2020-03-22 ENCOUNTER — Other Ambulatory Visit: Payer: Self-pay

## 2020-03-22 NOTE — Progress Notes (Signed)
Patient denies shortness of breath, fever, cough or chest pain.  PCP - Dr Clayton Bibles Cardiologist - Dr [name]  Chest x-ray - 10/15/19 (2V) EKG - 10/15/19 Stress Test - 08/24/16 CE ECHO - 02/23/18 Cardiac Cath - 04/27/17  Sleep Study -  Yes CPAP - does not use cpap  Fasting Blood Sugar - 100s Checks Blood Sugar 2-3 times a day DM II, no meds, libre system to check cbg  . If your blood sugar is less than 70 mg/dL, you will need to treat for low blood sugar: o Treat a low blood sugar (less than 70 mg/dL) with  cup of clear juice (cranberry or apple), 4 glucose tablets, OR glucose gel. o Recheck blood sugar in 15 minutes after treatment (to make sure it is greater than 70 mg/dL). If your blood sugar is not greater than 70 mg/dL on recheck, call [phone] for further instructions.  Blood Thinner Instructions:  Last dose of plavix was on Friday, 03/19/20.    Anesthesia review: Yes  STOP now taking any Aspirin (unless otherwise instructed by your surgeon), Aleve, Naproxen, Ibuprofen, Motrin, Advil, Goody's, BC's, all herbal medications, fish oil, and all vitamins.   Coronavirus Screening Covid test on 03/20/20 was negative.  Patient verbalized understanding of instructions that were given via phone.

## 2020-03-23 ENCOUNTER — Ambulatory Visit (HOSPITAL_COMMUNITY): Payer: Medicare Other | Admitting: Vascular Surgery

## 2020-03-23 ENCOUNTER — Encounter (HOSPITAL_COMMUNITY): Payer: Self-pay | Admitting: Vascular Surgery

## 2020-03-23 ENCOUNTER — Ambulatory Visit (HOSPITAL_COMMUNITY)
Admission: RE | Admit: 2020-03-23 | Discharge: 2020-03-23 | Disposition: A | Discharge status: Home / Self Care | Payer: Medicare Other | Attending: Vascular Surgery | Admitting: Vascular Surgery

## 2020-03-23 ENCOUNTER — Encounter (HOSPITAL_COMMUNITY)
Admission: RE | Disposition: A | Discharge status: Home / Self Care | Payer: Self-pay | Source: Home / Self Care | Attending: Vascular Surgery

## 2020-03-23 DIAGNOSIS — E1122 Type 2 diabetes mellitus with diabetic chronic kidney disease: Secondary | ICD-10-CM | POA: Diagnosis not present

## 2020-03-23 DIAGNOSIS — T82898A Other specified complication of vascular prosthetic devices, implants and grafts, initial encounter: Secondary | ICD-10-CM | POA: Diagnosis not present

## 2020-03-23 DIAGNOSIS — Z79899 Other long term (current) drug therapy: Secondary | ICD-10-CM | POA: Diagnosis not present

## 2020-03-23 DIAGNOSIS — Z888 Allergy status to other drugs, medicaments and biological substances status: Secondary | ICD-10-CM | POA: Diagnosis not present

## 2020-03-23 DIAGNOSIS — Z992 Dependence on renal dialysis: Secondary | ICD-10-CM | POA: Diagnosis not present

## 2020-03-23 DIAGNOSIS — I509 Heart failure, unspecified: Secondary | ICD-10-CM | POA: Diagnosis not present

## 2020-03-23 DIAGNOSIS — I132 Hypertensive heart and chronic kidney disease with heart failure and with stage 5 chronic kidney disease, or end stage renal disease: Secondary | ICD-10-CM | POA: Insufficient documentation

## 2020-03-23 DIAGNOSIS — Y712 Prosthetic and other implants, materials and accessory cardiovascular devices associated with adverse incidents: Secondary | ICD-10-CM | POA: Diagnosis not present

## 2020-03-23 DIAGNOSIS — T82590A Other mechanical complication of surgically created arteriovenous fistula, initial encounter: Secondary | ICD-10-CM | POA: Diagnosis present

## 2020-03-23 DIAGNOSIS — N186 End stage renal disease: Secondary | ICD-10-CM | POA: Insufficient documentation

## 2020-03-23 DIAGNOSIS — Z87891 Personal history of nicotine dependence: Secondary | ICD-10-CM | POA: Insufficient documentation

## 2020-03-23 DIAGNOSIS — Z7902 Long term (current) use of antithrombotics/antiplatelets: Secondary | ICD-10-CM | POA: Insufficient documentation

## 2020-03-23 DIAGNOSIS — Z7982 Long term (current) use of aspirin: Secondary | ICD-10-CM | POA: Diagnosis not present

## 2020-03-23 HISTORY — PX: FISTULA SUPERFICIALIZATION: SHX6341

## 2020-03-23 LAB — POCT I-STAT, CHEM 8
BUN: 38 mg/dL — ABNORMAL HIGH (ref 8–23)
Calcium, Ion: 1.1 mmol/L — ABNORMAL LOW (ref 1.15–1.40)
Chloride: 90 mmol/L — ABNORMAL LOW (ref 98–111)
Creatinine, Ser: 7.5 mg/dL — ABNORMAL HIGH (ref 0.61–1.24)
Glucose, Bld: 81 mg/dL (ref 70–99)
HCT: 48 % (ref 39.0–52.0)
Hemoglobin: 16.3 g/dL (ref 13.0–17.0)
Potassium: 3.6 mmol/L (ref 3.5–5.1)
Sodium: 135 mmol/L (ref 135–145)
TCO2: 33 mmol/L — ABNORMAL HIGH (ref 22–32)

## 2020-03-23 LAB — GLUCOSE, CAPILLARY: Glucose-Capillary: 83 mg/dL (ref 70–99)

## 2020-03-23 SURGERY — FISTULA SUPERFICIALIZATION
Anesthesia: Monitor Anesthesia Care | Laterality: Left

## 2020-03-23 MED ORDER — MIDAZOLAM HCL 5 MG/5ML IJ SOLN
INTRAMUSCULAR | Status: DC | PRN
  Administered 2020-03-23: 1 mg via INTRAVENOUS

## 2020-03-23 MED ORDER — HYDROMORPHONE HCL 1 MG/ML IJ SOLN
0.2500 mg | INTRAMUSCULAR | Status: DC | PRN
Start: 2020-03-23 — End: 2020-03-23
  Administered 2020-03-23: 0.5 mg via INTRAVENOUS

## 2020-03-23 MED ORDER — CHLORHEXIDINE GLUCONATE 4 % EX LIQD: 60.0000 mL | Freq: Once | CUTANEOUS | Status: DC

## 2020-03-23 MED ORDER — HYDROMORPHONE HCL 1 MG/ML IJ SOLN
INTRAMUSCULAR | Status: AC
  Filled 2020-03-23: qty 1

## 2020-03-23 MED ORDER — SODIUM CHLORIDE 0.9 % IV SOLN
INTRAVENOUS | Status: DC | PRN
  Administered 2020-03-23: 500 mL

## 2020-03-23 MED ORDER — CHLORHEXIDINE GLUCONATE 0.12 % MT SOLN
OROMUCOSAL | Status: AC
  Administered 2020-03-23: 15 mL via OROMUCOSAL
  Filled 2020-03-23: qty 15

## 2020-03-23 MED ORDER — CEFAZOLIN SODIUM-DEXTROSE 2-4 GM/100ML-% IV SOLN
INTRAVENOUS | Status: AC
  Filled 2020-03-23: qty 100

## 2020-03-23 MED ORDER — CEFAZOLIN SODIUM-DEXTROSE 2-4 GM/100ML-% IV SOLN
2.0000 g | INTRAVENOUS | Status: AC
  Administered 2020-03-23: 2 g via INTRAVENOUS

## 2020-03-23 MED ORDER — PROPOFOL 1000 MG/100ML IV EMUL
INTRAVENOUS | Status: AC
  Filled 2020-03-23: qty 100

## 2020-03-23 MED ORDER — LIDOCAINE 2% (20 MG/ML) 5 ML SYRINGE
INTRAMUSCULAR | Status: DC | PRN
  Administered 2020-03-23: 50 mg via INTRAVENOUS

## 2020-03-23 MED ORDER — CHLORHEXIDINE GLUCONATE 0.12 % MT SOLN: 15.0000 mL | Freq: Once | OROMUCOSAL | Status: AC

## 2020-03-23 MED ORDER — LIDOCAINE-EPINEPHRINE 0.5 %-1:200000 IJ SOLN
INTRAMUSCULAR | Status: AC
  Filled 2020-03-23: qty 1

## 2020-03-23 MED ORDER — MIDAZOLAM HCL 2 MG/2ML IJ SOLN
INTRAMUSCULAR | Status: AC
  Filled 2020-03-23: qty 2

## 2020-03-23 MED ORDER — FENTANYL CITRATE (PF) 250 MCG/5ML IJ SOLN
INTRAMUSCULAR | Status: AC
  Filled 2020-03-23: qty 5

## 2020-03-23 MED ORDER — PROPOFOL 10 MG/ML IV BOLUS
INTRAVENOUS | Status: AC
  Filled 2020-03-23: qty 20

## 2020-03-23 MED ORDER — MEPERIDINE HCL 25 MG/ML IJ SOLN: 6.2500 mg | INTRAMUSCULAR | Status: DC | PRN

## 2020-03-23 MED ORDER — SODIUM CHLORIDE 0.9 % IV SOLN
INTRAVENOUS | Status: AC
  Filled 2020-03-23: qty 1.2

## 2020-03-23 MED ORDER — 0.9 % SODIUM CHLORIDE (POUR BTL) OPTIME
TOPICAL | Status: DC | PRN
  Administered 2020-03-23: 1000 mL

## 2020-03-23 MED ORDER — PROPOFOL 500 MG/50ML IV EMUL
INTRAVENOUS | Status: DC | PRN
  Administered 2020-03-23: 50 ug/kg/min via INTRAVENOUS

## 2020-03-23 MED ORDER — FENTANYL CITRATE (PF) 250 MCG/5ML IJ SOLN
INTRAMUSCULAR | Status: DC | PRN
  Administered 2020-03-23: 50 ug via INTRAVENOUS

## 2020-03-23 MED ORDER — ONDANSETRON HCL 4 MG/2ML IJ SOLN: 4.0000 mg | Freq: Once | INTRAMUSCULAR | Status: DC | PRN

## 2020-03-23 MED ORDER — LIDOCAINE-EPINEPHRINE 0.5 %-1:200000 IJ SOLN
INTRAMUSCULAR | Status: DC | PRN
  Administered 2020-03-23: 50 mL

## 2020-03-23 MED ORDER — SODIUM CHLORIDE 0.9 % IV SOLN: INTRAVENOUS | Status: DC

## 2020-03-23 SURGICAL SUPPLY — 36 items
ADH SKN CLS APL DERMABOND .7 (GAUZE/BANDAGES/DRESSINGS) ×1
ARMBAND PINK RESTRICT EXTREMIT (MISCELLANEOUS) ×2 IMPLANT
CANISTER SUCT 3000ML PPV (MISCELLANEOUS) ×2 IMPLANT
CLIP LIGATING EXTRA MED SLVR (CLIP) ×2 IMPLANT
CLIP LIGATING EXTRA SM BLUE (MISCELLANEOUS) ×2 IMPLANT
COVER PROBE W GEL 5X96 (DRAPES) ×2 IMPLANT
COVER WAND RF STERILE (DRAPES) ×2 IMPLANT
DECANTER SPIKE VIAL GLASS SM (MISCELLANEOUS) ×2 IMPLANT
DERMABOND ADVANCED (GAUZE/BANDAGES/DRESSINGS) ×1
DERMABOND ADVANCED .7 DNX12 (GAUZE/BANDAGES/DRESSINGS) ×1 IMPLANT
ELECT REM PT RETURN 9FT ADLT (ELECTROSURGICAL) ×2
ELECTRODE REM PT RTRN 9FT ADLT (ELECTROSURGICAL) ×1 IMPLANT
GLOVE BIO SURGEON STRL SZ 6.5 (GLOVE) ×2 IMPLANT
GLOVE BIO SURGEON STRL SZ7.5 (GLOVE) ×3 IMPLANT
GLOVE BIOGEL PI IND STRL 8.5 (GLOVE) ×1 IMPLANT
GLOVE BIOGEL PI INDICATOR 8.5 (GLOVE) ×1
GLOVE SS BIOGEL STRL SZ 7.5 (GLOVE) ×1 IMPLANT
GLOVE SUPERSENSE BIOGEL SZ 7.5 (GLOVE) ×1
GLOVE SURG SS PI 6.5 STRL IVOR (GLOVE) ×2 IMPLANT
GOWN STRL REUS W/ TWL LRG LVL3 (GOWN DISPOSABLE) ×3 IMPLANT
GOWN STRL REUS W/ TWL XL LVL3 (GOWN DISPOSABLE) ×1 IMPLANT
GOWN STRL REUS W/TWL LRG LVL3 (GOWN DISPOSABLE) ×4
GOWN STRL REUS W/TWL XL LVL3 (GOWN DISPOSABLE) ×2
KIT BASIN OR (CUSTOM PROCEDURE TRAY) ×2 IMPLANT
KIT TURNOVER KIT B (KITS) ×2 IMPLANT
NS IRRIG 1000ML POUR BTL (IV SOLUTION) ×2 IMPLANT
PACK CV ACCESS (CUSTOM PROCEDURE TRAY) ×2 IMPLANT
PAD ARMBOARD 7.5X6 YLW CONV (MISCELLANEOUS) ×4 IMPLANT
SUT PROLENE 5 0 C 1 24 (SUTURE) ×4 IMPLANT
SUT PROLENE 6 0 CC (SUTURE) ×3 IMPLANT
SUT VIC AB 3-0 SH 27 (SUTURE) ×2
SUT VIC AB 3-0 SH 27X BRD (SUTURE) ×1 IMPLANT
SYR CONTROL 10ML LL (SYRINGE) ×2 IMPLANT
TOWEL GREEN STERILE (TOWEL DISPOSABLE) IMPLANT
UNDERPAD 30X36 HEAVY ABSORB (UNDERPADS AND DIAPERS) ×2 IMPLANT
WATER STERILE IRR 1000ML POUR (IV SOLUTION) ×2 IMPLANT

## 2020-03-23 NOTE — Discharge Instructions (Signed)
° °  [facility] ° °Discharge Instructions ° °AV Fistula or Graft Surgery for Dialysis Access ° °Please refer to the following instructions for your post-procedure care. Your surgeon or physician assistant will discuss any changes with you. ° °Activity ° °You may drive the day following your surgery, if you are comfortable and no longer taking prescription pain medication. Resume full activity as the soreness in your incision resolves. ° °Bathing/Showering ° °You may shower after you go home. Keep your incision dry for 48 hours. Do not soak in a bathtub, hot tub, or swim until the incision heals completely. You may not shower if you have a hemodialysis catheter. ° °Incision Care ° °Clean your incision with mild soap and water after 48 hours. Pat the area dry with a clean towel. You do not need a bandage unless otherwise instructed. Do not apply any ointments or creams to your incision. You may have skin glue on your incision. Do not peel it off. It will come off on its own in about one week. Your arm may swell a bit after surgery. To reduce swelling use pillows to elevate your arm so it is above your heart. Your doctor will tell you if you need to lightly wrap your arm with an ACE bandage. ° °Diet ° °Resume your normal diet. There are not special food restrictions following this procedure. In order to heal from your surgery, it is CRITICAL to get adequate nutrition. Your body requires vitamins, minerals, and protein. Vegetables are the best source of vitamins and minerals. Vegetables also provide the perfect balance of protein. Processed food has little nutritional value, so try to avoid this. ° °Medications ° °Resume taking all of your medications. If your incision is causing pain, you may take over-the counter pain relievers such as acetaminophen (Tylenol). If you were prescribed a stronger pain medication, please be aware these medications can cause nausea and constipation. Prevent  nausea by taking the medication with a snack or meal. Avoid constipation by drinking plenty of fluids and eating foods with high amount of fiber, such as fruits, vegetables, and grains. Do not take Tylenol if you are taking prescription pain medications. ° ° ° ° °Follow up °Your surgeon may want to see you in the office following your access surgery. If so, this will be arranged at the time of your surgery. ° °Please call us immediately for any of the following conditions: ° °Increased pain, redness, drainage (pus) from your incision site °Fever of 101 degrees or higher °Severe or worsening pain at your incision site °Hand pain or numbness. ° °Reduce your risk of vascular disease: ° °Stop smoking. If you would like help, call [facility] at 1-800-QUIT-NOW (1-800-784-8669) or [place] at 336-586-4000 ° °Manage your cholesterol °Maintain a desired weight °Control your diabetes °Keep your blood pressure down ° °Dialysis ° °It will take several weeks to several months for your new dialysis access to be ready for use. Your surgeon will determine when it is OK to use it. Your nephrologist will continue to direct your dialysis. You can continue to use your Permcath until your new access is ready for use. ° °If you have any questions, please call the office at [phone]. ° °

## 2020-03-23 NOTE — Interval H&P Note (Signed)
History and Physical Interval Note:  03/23/2020 7:21 AM  Jon Anderson  has presented today for surgery, with the diagnosis of ANEURYSM OF ARTERIOVENOUS FISTULA.  The various methods of treatment have been discussed with the patient and family. After consideration of risks, benefits and other options for treatment, the patient has consented to  Procedure(s): LEFT UPPER EXTREMITY ARTERIOVENOUS FISTULA PLICATION (Left) as a surgical intervention.  The patient's history has been reviewed, patient examined, no change in status, stable for surgery.  I have reviewed the patient's chart and labs.  Questions were answered to the patient's satisfaction.     Curt Jews

## 2020-03-23 NOTE — Anesthesia Preprocedure Evaluation (Signed)
Anesthesia Evaluation  Patient identified by MRN, date of birth, ID band Patient awake    Reviewed: Allergy & Precautions, NPO status , Patient's Chart, lab work & pertinent test results  Airway Mallampati: II  TM Distance: >3 FB Neck ROM: Full    Dental   Pulmonary sleep apnea , former smoker,    Pulmonary exam normal        Cardiovascular hypertension, + CAD and +CHF  Normal cardiovascular exam     Neuro/Psych    GI/Hepatic GERD  Medicated and Controlled,  Endo/Other  diabetes  Renal/GU ESRF and DialysisRenal disease     Musculoskeletal   Abdominal   Peds  Hematology   Anesthesia Other Findings   Reproductive/Obstetrics                             Anesthesia Physical Anesthesia Plan  ASA: III  Anesthesia Plan: MAC   Post-op Pain Management:    Induction: Intravenous  PONV Risk Score and Plan: 1 and Ondansetron  Airway Management Planned: Nasal Cannula  Additional Equipment:   Intra-op Plan:   Post-operative Plan:   Informed Consent: I have reviewed the patients History and Physical, chart, labs and discussed the procedure including the risks, benefits and alternatives for the proposed anesthesia with the patient or authorized representative who has indicated his/her understanding and acceptance.       Plan Discussed with: CRNA and Surgeon  Anesthesia Plan Comments:         Anesthesia Quick Evaluation

## 2020-03-23 NOTE — Anesthesia Postprocedure Evaluation (Signed)
Anesthesia Post Note  Patient: Jon Anderson  Procedure(s) Performed: LEFT UPPER EXTREMITY ARTERIOVENOUS FISTULA PLICATION (Left )     Patient location during evaluation: PACU Anesthesia Type: MAC Level of consciousness: awake and alert Pain management: pain level controlled Vital Signs Assessment: post-procedure vital signs reviewed and stable Respiratory status: spontaneous breathing, nonlabored ventilation, respiratory function stable and patient connected to nasal cannula oxygen Cardiovascular status: stable and blood pressure returned to baseline Postop Assessment: no apparent nausea or vomiting Anesthetic complications: no   No complications documented.  Last Vitals:  Vitals:   03/23/20 0915 03/23/20 0922  BP: 139/87 140/82  Pulse: 67 62  Resp: 12 16  Temp:  (!) 36.3 C  SpO2: 98% 99%    Last Pain:  Vitals:   03/23/20 0922  TempSrc:   PainSc: 0-No pain                 Zaid Tomes DAVID

## 2020-03-23 NOTE — Transfer of Care (Signed)
Immediate Anesthesia Transfer of Care Note  Patient: Jon Anderson  Procedure(s) Performed: LEFT UPPER EXTREMITY ARTERIOVENOUS FISTULA PLICATION (Left )  Patient Location: PACU  Anesthesia Type:MAC  Level of Consciousness: awake, alert  and oriented  Airway & Oxygen Therapy: Patient Spontanous Breathing  Post-op Assessment: Report given to RN and Post -op Vital signs reviewed and stable  Post vital signs: Reviewed and stable  Last Vitals:  Vitals Value Taken Time  BP 135/84 03/23/20 0857  Temp    Pulse 68 03/23/20 0859  Resp 19 03/23/20 0859  SpO2 100 % 03/23/20 0859  Vitals shown include unvalidated device data.  Last Pain:  Vitals:   03/23/20 0629  TempSrc: Oral  PainSc:       Patients Stated Pain Goal: 6 (47/09/62 8366)  Complications: No complications documented.

## 2020-03-23 NOTE — Op Note (Signed)
    OPERATIVE REPORT  DATE OF SURGERY: 03/23/2020  PATIENT: Jon Anderson, 68 y.o. 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  PHYSICIAN ASSISTANT: Arlee Muslim, PA-C  The assistant was needed for exposure and to expedite the case  ANESTHESIA: Local with sedation  EBL: per anesthesia record  Total I/O In: 500 [P.O.:100; I.V.:300; IV Piggyback:100] Out: -   BLOOD ADMINISTERED: none  DRAINS: none  SPECIMEN: none  COUNTS CORRECT:  YES  PATIENT DISPOSITION:  PACU - hemodynamically stable  PROCEDURE DETAILS: Patient was taken up and placed to position where the area of the left arm prepped draped in sterile fashion.  Using local anesthesia an ellipse was made around the area of the upper arm fistula.  There had been loss of pigmentation and thinning of the skin.  An ellipse of skin was completely removed.  The aneurysmal vein below this was mobilized proximally and distally.  The vein was occluded proximal distal to the aneurysmal segment.  The aneurysmal segment of the vein was opened longitudinally and a portion of the vein was resected.  The vein was then closed longitudinally with 2 layers of running 5-0 Prolene suture.  Clamps were removed and good hemostasis was encountered.  The vein was slightly rolled medially and was tacked with 5-0 Prolene sutures to place the suture line on the more medial aspect of the incision.  The wound was irrigated with saline.  Hemostasis electrocautery.  The wound was closed with 3-0 Vicryl in the subcuticular tissue.  Sterile dressing was applied and the patient was transferred to the recovery room in stable condition   Rosetta Posner, M.D., [facility] 03/23/2020 9:09 AM

## 2020-03-23 NOTE — Anesthesia Procedure Notes (Signed)
Procedure Name: MAC Date/Time: 03/23/2020 7:31 AM Performed by: Mariea Clonts, CRNA Pre-anesthesia Checklist: Patient identified, Emergency Drugs available, Suction available, Patient being monitored and Timeout performed Patient Re-evaluated:Patient Re-evaluated prior to induction Oxygen Delivery Method: Simple face mask

## 2020-03-24 ENCOUNTER — Encounter (HOSPITAL_COMMUNITY): Payer: Self-pay | Admitting: Vascular Surgery

## 2020-03-30 ENCOUNTER — Encounter: Payer: Self-pay | Admitting: Physician Assistant

## 2020-03-30 ENCOUNTER — Telehealth: Payer: Self-pay

## 2020-03-30 ENCOUNTER — Ambulatory Visit (INDEPENDENT_AMBULATORY_CARE_PROVIDER_SITE_OTHER): Payer: Self-pay | Admitting: Physician Assistant

## 2020-03-30 ENCOUNTER — Other Ambulatory Visit: Payer: Self-pay | Admitting: Physician Assistant

## 2020-03-30 ENCOUNTER — Other Ambulatory Visit: Payer: Self-pay

## 2020-03-30 VITALS — BP 94/54 | HR 76 | Temp 97.8°F | Resp 20 | Ht 71.0 in | Wt 164.9 lb

## 2020-03-30 DIAGNOSIS — Z992 Dependence on renal dialysis: Secondary | ICD-10-CM

## 2020-03-30 DIAGNOSIS — N186 End stage renal disease: Secondary | ICD-10-CM

## 2020-03-30 NOTE — Progress Notes (Signed)
    Postoperative Access Visit   History of Present Illness   Jon Anderson is a 69 y.o. year old male who presents for postoperative follow-up for revision with plication of left arm AV fistula on 03/23/20 by Dr. Donnetta Hutching. Prior to this procedure he had a revision of left upper arm AV fistula with resection of threatened skin and plication of underlying venous aneurysm by Dr. Donnetta Hutching on 02/10/20. This was done in a staged manner due to extent of aneurysm in the proximal and distal aspects of the fistula and also per patient request to try to avoid needing to use a TDC.   He is here today because at dialysis yesterday a tech unfamiliar to the patient stuck the fistula in the incision line from recent plication and he is now having pain, bloody oozing, swelling and bruising over the incision. He says he tried to instruct the tech against doing this but she proceeded anyways. They had to hold pressure for 30-45 minutes due to bleeding. He had minimal bleeding since yesterday just a few spots. The patient otherwise notes no steal symptoms. His previous incision is healing well  He dialyzes on MWF at the [facility] [address] location   Physical Examination   Vitals:   03/30/20 1312  BP: (!) 94/54  Pulse: 76  Resp: 20  Temp: 97.8 F (36.6 C)  TempSrc: Temporal  SpO2: 97%  Weight: 164 lb 14.4 oz (74.8 kg)  Height: 5\' 11"  (1.803 m)   Body mass index is 23 kg/m.  left arm Incision in the distal upper arm is healing well, the mid av fistula incision is intact but still healing, Dermabond present in proximal aspect. There is small superficial scab. No active bleeding. There is a hematoma around the incision with ecchymosis. 2+ radial pulse, hand grip is 5/5, sensation in digits is  intact, palpable thrill, bruit can be auscultated     Medical Decision Making    Jon Anderson is a 69 y.o. year old male who presents s/p revision with plication of left arm AV fistula on 03/23/20 by Dr. Donnetta Hutching. The  tech at dialysis stuck his proximal incision and following he had swelling, bruising, bleeding and pain. He had minimal bleeding since yesterday just a few spots. I do not feel that the incision is disrupted. He does have a small hematoma around the incision. I do not feel that this is enlarging. I have recommended placing TDC and allowing left AV fistula time to rest/incision to heal but patient is very adamant about not wanting a TDC  Recommend tylenol for pain as needed  He will see how dialysis goes tomorrow and if there are any issues he will call to follow up  I advised him to keep the incision covered so that at dialysis the techs will not attempt to access this area   If he does not have any further issues he will keep his scheduled post op visit on 04/20/20  Karoline Caldwell, PA-C [facility] [place] Office: [phone]  Clinic MD: Dr. Carlis Abbott

## 2020-03-30 NOTE — Telephone Encounter (Signed)
Patient is having plication done in stages to avoid placement of TDC. According to patient, yesterday HD accessed proximal portion of fistula that was plicated on 81/10. Patient was unable to tolerate and the site was switched. Per patient, he was healing well until this. Currently, he is having 7 out of 10 throbbing pain, some bruising and localized swelling. Put patient on schedule today for evaluation.

## 2020-04-01 ENCOUNTER — Other Ambulatory Visit: Payer: Self-pay | Admitting: Surgical

## 2020-04-01 ENCOUNTER — Telehealth: Payer: Self-pay | Admitting: Orthopedic Surgery

## 2020-04-01 MED ORDER — HYDROCODONE-ACETAMINOPHEN 5-325 MG PO TABS: 1.0000 | ORAL_TABLET | Freq: Two times a day (BID) | ORAL | 0 refills | Status: DC | PRN

## 2020-04-01 NOTE — Telephone Encounter (Signed)
Can you please advise since Sharol Given out of office today

## 2020-04-01 NOTE — Telephone Encounter (Signed)
Pt called asking if he can be sent in pain medication.   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

## 2020-04-01 NOTE — Telephone Encounter (Signed)
Sent in and called, left VM

## 2020-04-05 ENCOUNTER — Other Ambulatory Visit: Payer: Self-pay | Admitting: *Deleted

## 2020-04-06 MED ORDER — GABAPENTIN 100 MG PO CAPS: 100.0000 mg | ORAL_CAPSULE | Freq: Three times a day (TID) | ORAL | 0 refills | Status: DC | PRN

## 2020-04-14 ENCOUNTER — Ambulatory Visit (HOSPITAL_COMMUNITY)
Admission: RE | Admit: 2020-04-14 | Payer: BC Managed Care – PPO | Source: Ambulatory Visit | Attending: Cardiovascular Disease | Admitting: Cardiovascular Disease

## 2020-04-20 ENCOUNTER — Other Ambulatory Visit: Payer: Self-pay

## 2020-04-20 ENCOUNTER — Encounter (HOSPITAL_COMMUNITY): Payer: Medicare Other

## 2020-04-20 ENCOUNTER — Ambulatory Visit (INDEPENDENT_AMBULATORY_CARE_PROVIDER_SITE_OTHER): Payer: Self-pay | Admitting: Physician Assistant

## 2020-04-20 VITALS — BP 82/47 | HR 92 | Temp 97.6°F | Resp 18 | Ht 71.0 in | Wt 165.4 lb

## 2020-04-20 DIAGNOSIS — N186 End stage renal disease: Secondary | ICD-10-CM

## 2020-04-20 DIAGNOSIS — Z992 Dependence on renal dialysis: Secondary | ICD-10-CM

## 2020-04-20 NOTE — Progress Notes (Signed)
POST OPERATIVE OFFICE NOTE    CC:  F/u for surgery  HPI:  This is a 70 y.o. male who is s/p Plication left UE AV fistula  On 47/42/59 and distal plication on 56/06/8754 by Dr. Donnetta Hutching.    Pt returns today for follow up.  He denise pain, loss of motor and loss of sensation.  He is on HD using the fistula and avoiding the new incision site.      Allergies  Allergen Reactions  . Kiwi Extract Itching, Swelling and Other (See Comments)    Lips and face swell- breathing not affected  . Flexeril [Cyclobenzaprine]     Hands become flimsy, can not hold things  . Tape Other (See Comments)    "Plastic" tape causes blisters!!    Current Outpatient Medications  Medication Sig Dispense Refill  . aspirin EC 81 MG tablet Take 1 tablet (81 mg total) by mouth daily.    Marland Kitchen atorvastatin (LIPITOR) 40 MG tablet Take 1 tablet (40 mg total) by mouth daily at 6 PM. 30 tablet 5  . calcitRIOL (ROCALTROL) 0.25 MCG capsule Take 5 capsules (1.25 mcg total) by mouth every Monday, Wednesday, and Friday with hemodialysis. (Patient taking differently: Take 0.25 mcg by mouth every Monday, Wednesday, and Friday with hemodialysis. Morning) 60 capsule 0  . calcium acetate (PHOSLO) 667 MG capsule Take 2 capsules (1,334 mg total) by mouth 2 (two) times daily with a meal. 180 capsule 0  . clopidogrel (PLAVIX) 75 MG tablet Take 1 tablet (75 mg total) by mouth daily. 30 tablet 0  . doxycycline (VIBRA-TABS) 100 MG tablet TAKE 1 TABLET BY MOUTH TWICE A DAY 60 tablet 0  . gabapentin (NEURONTIN) 100 MG capsule Take 1 capsule (100 mg total) by mouth 3 (three) times daily as needed. 90 capsule 0  . HYDROcodone-acetaminophen (NORCO/VICODIN) 5-325 MG tablet Take 1 tablet by mouth every 12 (twelve) hours as needed for moderate pain. 20 tablet 0  . lipase/protease/amylase (CREON) 36000 UNITS CPEP capsule Take 1 capsule (36,000 Units total) by mouth daily. 180 capsule 0  . loperamide (IMODIUM) 2 MG capsule Take 2 mg by mouth as needed for  diarrhea or loose stools.    . midodrine (PROAMATINE) 5 MG tablet Take 1 tablet (5 mg total) by mouth every Monday, Wednesday, and Friday. Take with Dialysis (Patient taking differently: Take 5 mg by mouth every Monday, Wednesday, and Friday with hemodialysis. Take with Dialysis) 60 tablet 0  . multivitamin (RENA-VIT) TABS tablet Take 1 tablet by mouth at bedtime. 30 tablet 0  . Naldemedine Tosylate 0.2 MG TABS Take 0.2 mg by mouth daily as needed (Constipation).     . nitroGLYCERIN (NITRODUR - DOSED IN MG/24 HR) 0.2 mg/hr patch Place 1 patch (0.2 mg total) onto the skin daily. Place on different areas of right forefoot daily 30 patch 12  . nitroGLYCERIN (NITROSTAT) 0.4 MG SL tablet Place 1 tablet (0.4 mg total) under the tongue every 5 (five) minutes as needed for chest pain. 25 tablet 3  . pantoprazole (PROTONIX) 40 MG tablet Take 1 tablet (40 mg total) by mouth daily. 30 tablet 0  . pentoxifylline (TRENTAL) 400 MG CR tablet Take 1 tablet (400 mg total) by mouth daily. (Patient taking differently: Take 400 mg by mouth at bedtime.) 90 tablet 3  . polyethylene glycol (MIRALAX / GLYCOLAX) packet Take 17 g by mouth daily as needed for moderate constipation. 14 each 0   No current facility-administered medications for this visit.  ROS:  See HPI  Physical Exam:    Incision:  Well healed incision Extremities:  Grip 5/5, sensation intact, and palpable radial pulse on the left UE. Lungs: non labored breathing  Assessment/Plan:  This is a 70 y.o. male who is s/p:plication of the upper aneurysm  He has stayed on HD having them stay away from the new incision  Stable disposition.  The fistula may continue to be accessed trying to avoid the newest incision at least until 04/23/20.  F/U PRN  Roxy Horseman PA-C [facility] [phone]  Clinic MD:  Stanford Breed

## 2020-04-29 ENCOUNTER — Other Ambulatory Visit: Payer: Self-pay

## 2020-04-29 ENCOUNTER — Other Ambulatory Visit (HOSPITAL_COMMUNITY): Payer: Self-pay | Admitting: Cardiovascular Disease

## 2020-04-29 ENCOUNTER — Ambulatory Visit (HOSPITAL_COMMUNITY)
Admission: RE | Admit: 2020-04-29 | Discharge: 2020-04-29 | Disposition: A | Discharge status: Home / Self Care | Payer: Medicare Other | Source: Ambulatory Visit | Attending: Cardiology | Admitting: Cardiology

## 2020-04-29 DIAGNOSIS — I739 Peripheral vascular disease, unspecified: Secondary | ICD-10-CM | POA: Diagnosis present

## 2020-04-29 DIAGNOSIS — Z95820 Peripheral vascular angioplasty status with implants and grafts: Secondary | ICD-10-CM

## 2020-05-31 ENCOUNTER — Other Ambulatory Visit: Payer: Self-pay | Admitting: Physical Medicine and Rehabilitation

## 2020-06-03 ENCOUNTER — Other Ambulatory Visit: Payer: Self-pay

## 2020-06-03 ENCOUNTER — Ambulatory Visit (HOSPITAL_COMMUNITY)
Admission: RE | Admit: 2020-06-03 | Discharge: 2020-06-03 | Disposition: A | Discharge status: Home / Self Care | Payer: BC Managed Care – PPO | Source: Ambulatory Visit | Attending: Cardiovascular Disease | Admitting: Cardiovascular Disease

## 2020-06-03 ENCOUNTER — Other Ambulatory Visit (HOSPITAL_COMMUNITY): Payer: Self-pay | Admitting: Cardiovascular Disease

## 2020-06-03 DIAGNOSIS — I6522 Occlusion and stenosis of left carotid artery: Secondary | ICD-10-CM

## 2020-06-03 DIAGNOSIS — I6523 Occlusion and stenosis of bilateral carotid arteries: Secondary | ICD-10-CM | POA: Diagnosis not present

## 2020-06-30 ENCOUNTER — Encounter (HOSPITAL_COMMUNITY): Payer: Medicare Other

## 2020-08-03 ENCOUNTER — Other Ambulatory Visit: Payer: Self-pay

## 2020-08-03 ENCOUNTER — Ambulatory Visit (INDEPENDENT_AMBULATORY_CARE_PROVIDER_SITE_OTHER): Payer: Medicare Other | Admitting: Cardiovascular Disease

## 2020-08-03 ENCOUNTER — Encounter: Payer: Self-pay | Admitting: Cardiovascular Disease

## 2020-08-03 DIAGNOSIS — Z951 Presence of aortocoronary bypass graft: Secondary | ICD-10-CM

## 2020-08-03 DIAGNOSIS — E785 Hyperlipidemia, unspecified: Secondary | ICD-10-CM | POA: Diagnosis not present

## 2020-08-03 DIAGNOSIS — I6522 Occlusion and stenosis of left carotid artery: Secondary | ICD-10-CM

## 2020-08-03 DIAGNOSIS — I10 Essential (primary) hypertension: Secondary | ICD-10-CM

## 2020-08-03 NOTE — Assessment & Plan Note (Signed)
History of hyperlipidemia on statin therapy.  His last lipid profile was performed 07/18/2018 revealing total cholesterol of 130, LDL 68 and HDL 47.  We will repeat a fasting lipid and liver profile.

## 2020-08-03 NOTE — Progress Notes (Signed)
08/03/2020 Jon Anderson   30-Mar-1951  [id number]  Primary Physician [name], MD Primary Cardiologist: Lorretta Harp MD [name], [place]  HPI:  Jon Anderson is a 70 y.o.  mildly overweight married African American male father of 2, grandfather Of one grandchild who Works as a Sports coach. Hewasreferred by Dr. Scherrie November for peripheral vascular evaluation prior to elective foot surgery. His primary care physician is Dr. Mellody Drown.I last saw him in the office3/06/2019. His cardiac risk factors include hypertension, diabetes and hyperlipidemia. He does not smoke. His sister died of a myocardial infarction at age 73. He has never had a heart attack or stroke and has had cardiac catheterization remotely that showed noncritical CAD. He does get fairly frequent chest pain and shortness of breath however. He has chronic renal sufficiency with creatinines in the mid 3 range thought to chronic kidney. Lower extremity arterial Doppler studies performed in the office 09/01/13 revealed a right ABI 1.1 the left of 0.76 with a high-frequency signal in the distal left SFA. He does complain of left calf claudication which is lifestyle limiting and affecting his ability to perform work.. There is no history of nonhealing wounds. Recent carotid Dopplers showed moderately severe left internal carotid artery stenosis.  Since I saw him last he has gone on dialysis February 2016. He also underwent off-pump LIMA insertion at [facility] in October of last year because of a high grade lesion that was discovered during a workup for renal transplant. He will cardiac catheterization by Dr. Ellyn Hack 04/27/17 revealed a patent LIMA to the LAD with otherwise noncritical disease and normal LV function. He currently denies chest pain. He is scheduled for laminectomy on 07/03/17. Carotid Dopplers performed at [facility] imaging showed moderately severe left ICA stenosis which when compared to  the Dopplers performed in our office 10/15/13, did not appear significantly different. However, given the severity of the lesion I n the 70-90%range CTA of his neck revealing no significant obstructive disease in his carotid circulation.  Since I saw him a year ago we did have a right TMA by Dr. Sharol Given and right popliteal and posterior tibial intervention by Dr. Donzetta Matters 08/21/2019.  He is on hemodialysis 3 times a week.  Dr. Donnetta Hutching recently revised his AV fistula.  He denies chest pain or shortness of breath.   Current Meds  Medication Sig  . aspirin EC 81 MG tablet Take 1 tablet (81 mg total) by mouth daily.  Marland Kitchen atorvastatin (LIPITOR) 40 MG tablet Take 1 tablet (40 mg total) by mouth daily at 6 PM.  . calcitRIOL (ROCALTROL) 0.25 MCG capsule Take 5 capsules (1.25 mcg total) by mouth every Monday, Wednesday, and Friday with hemodialysis. (Patient taking differently: Take 0.25 mcg by mouth every Monday, Wednesday, and Friday with hemodialysis. Morning)  . calcium acetate (PHOSLO) 667 MG capsule Take 2 capsules (1,334 mg total) by mouth 2 (two) times daily with a meal.  . clopidogrel (PLAVIX) 75 MG tablet Take 1 tablet (75 mg total) by mouth daily.  Marland Kitchen gabapentin (NEURONTIN) 100 MG capsule Take 1 capsule (100 mg total) by mouth 2 (two) times daily. Take 1 capsule(100mg  total in the am, 2 capsules(200mg  total) at bedtime.  Marland Kitchen HYDROcodone-acetaminophen (NORCO/VICODIN) 5-325 MG tablet Take 1 tablet by mouth every 12 (twelve) hours as needed for moderate pain.  Marland Kitchen lipase/protease/amylase (CREON) 36000 UNITS CPEP capsule Take 1 capsule (36,000 Units total) by mouth daily.  Marland Kitchen loperamide (IMODIUM) 2 MG capsule Take  2 mg by mouth as needed for diarrhea or loose stools.  . midodrine (PROAMATINE) 5 MG tablet Take 1 tablet (5 mg total) by mouth every Monday, Wednesday, and Friday. Take with Dialysis (Patient taking differently: Take 5 mg by mouth every Monday, Wednesday, and Friday with hemodialysis. Take with Dialysis)  .  multivitamin (RENA-VIT) TABS tablet Take 1 tablet by mouth at bedtime.  . nitroGLYCERIN (NITRODUR - DOSED IN MG/24 HR) 0.2 mg/hr patch Place 1 patch (0.2 mg total) onto the skin daily. Place on different areas of right forefoot daily  . nitroGLYCERIN (NITROSTAT) 0.4 MG SL tablet Place 1 tablet (0.4 mg total) under the tongue every 5 (five) minutes as needed for chest pain.  . pantoprazole (PROTONIX) 40 MG tablet Take 1 tablet (40 mg total) by mouth daily.  . pentoxifylline (TRENTAL) 400 MG CR tablet Take 1 tablet (400 mg total) by mouth daily. (Patient taking differently: Take 400 mg by mouth at bedtime.)  . polyethylene glycol (MIRALAX / GLYCOLAX) packet Take 17 g by mouth daily as needed for moderate constipation.  . [DISCONTINUED] doxycycline (VIBRA-TABS) 100 MG tablet TAKE 1 TABLET BY MOUTH TWICE A DAY  . [DISCONTINUED] Naldemedine Tosylate 0.2 MG TABS Take 0.2 mg by mouth daily as needed (Constipation).      Allergies  Allergen Reactions  . Kiwi Extract Itching, Swelling and Other (See Comments)    Lips and face swell- breathing not affected  . Flexeril [Cyclobenzaprine]     Hands become flimsy, can not hold things  . Tape Other (See Comments)    "Plastic" tape causes blisters!!    Social History   Socioeconomic History  . Marital status: Married    Spouse name: Not on file  . Number of children: Not on file  . Years of education: Not on file  . Highest education level: Not on file  Occupational History  . Occupation: retired  Tobacco Use  . Smoking status: Former Smoker    Packs/day: 1.00    Years: 2.00    Pack years: 2.00    Types: Cigarettes    Quit date: 1978    Years since quitting: 44.3  . Smokeless tobacco: Never Used  Vaping Use  . Vaping Use: Never used  Substance and Sexual Activity  . Alcohol use: Not Currently    Alcohol/week: 0.0 standard drinks    Comment: h/o social drinking  . Drug use: Not Currently    Types: Marijuana    Comment: quit 1986  .  Sexual activity: Yes    Birth control/protection: None  Other Topics Concern  . Not on file  Social History Narrative   The patient is a Retail buyer.  He is married and has 2 grown children.  Lives in [place] with his wife.  Denies tobacco, alcohol or IV drug abuse or marijuana or cocaine intake.    Social Determinants of Health   Financial Resource Strain: Not on file  Food Insecurity: Not on file  Transportation Needs: Not on file  Physical Activity: Not on file  Stress: Not on file  Social Connections: Not on file  Intimate Partner Violence: Not on file     Review of Systems: General: negative for chills, fever, night sweats or weight changes.  Cardiovascular: negative for chest pain, dyspnea on exertion, edema, orthopnea, palpitations, paroxysmal nocturnal dyspnea or shortness of breath Dermatological: negative for rash Respiratory: negative for cough or wheezing Urologic: negative for hematuria Abdominal: negative for nausea, vomiting, diarrhea, bright red blood per rectum,  melena, or hematemesis Neurologic: negative for visual changes, syncope, or dizziness All other systems reviewed and are otherwise negative except as noted above.    Blood pressure 119/62, pulse 67, height 5\' 11"  (1.803 m), weight 170 lb 1.3 oz (77.1 kg), SpO2 96 %.  General appearance: alert and no distress Neck: no adenopathy, no carotid bruit, no JVD, supple, symmetrical, trachea midline and thyroid not enlarged, symmetric, no tenderness/mass/nodules Lungs: clear to auscultation bilaterally Heart: regular rate and rhythm, S1, S2 normal, no murmur, click, rub or gallop Extremities: extremities normal, atraumatic, no cyanosis or edema Pulses: 2+ and symmetric Diminished pedal pulses Skin: Skin color, texture, turgor normal. No rashes or lesions Neurologic: Alert and oriented X 3, normal strength and tone. Normal symmetric reflexes. Normal coordination and gait  EKG sinus rhythm at 53 with old anteroseptal  infarct.  I personally reviewed this EKG.  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  He had a heart cath performed by Dr. Ellyn Hack 04/27/2017 during work-up for renal transplant that showed a patent LIMA to the LAD and otherwise no significant CAD with normal LV function.  He denies chest pain or shortness of breath.  Hyperlipidemia LDL goal <70 History of hyperlipidemia on statin therapy.  His last lipid profile was performed 07/18/2018 revealing total cholesterol of 130, LDL 68 and HDL 47.  We will repeat a fasting lipid and liver profile.  Essential hypertension History of essential hypertension with blood pressure measured today at 119/62.  He currently is not on any antihypertensive medications.  Carotid artery disease (HCC) History of carotid artery disease with Dopplers performed 06/04/2020 revealing moderate left ICA stenosis.  This will be repeated on annual basis.  Claudication [facility]) History of claudication with PAD status post left SFA intervention by myself 12/11/2013.  He did have critical limb ischemia status post right transmetatarsal amputation by Dr. Sharol Given last year.  He underwent peripheral intervention by Dr. Donzetta Matters 08/21/2019 with stenting of his right popliteal artery and angioplasty of his posterior tibial artery.  Dr. Donzetta Matters follows his lower extremity arterial Doppler studies.      Lorretta Harp MD FACP,FACC,FAHA, [facility] 08/03/2020 10:01 AM

## 2020-08-03 NOTE — Assessment & Plan Note (Signed)
History of CAD status post off-pump LIMA to the LAD at [facility] October 2018.  He had a heart cath performed by Dr. Ellyn Hack 04/27/2017 during work-up for renal transplant that showed a patent LIMA to the LAD and otherwise no significant CAD with normal LV function.  He denies chest pain or shortness of breath.

## 2020-08-03 NOTE — Assessment & Plan Note (Signed)
History of carotid artery disease with Dopplers performed 06/04/2020 revealing moderate left ICA stenosis.  This will be repeated on annual basis.

## 2020-08-03 NOTE — Assessment & Plan Note (Signed)
History of claudication with PAD status post left SFA intervention by myself 12/11/2013.  He did have critical limb ischemia status post right transmetatarsal amputation by Dr. Sharol Given last year.  He underwent peripheral intervention by Dr. Donzetta Matters 08/21/2019 with stenting of his right popliteal artery and angioplasty of his posterior tibial artery.  Dr. Donzetta Matters follows his lower extremity arterial Doppler studies.

## 2020-08-03 NOTE — Patient Instructions (Signed)
Medication Instructions:  Your physician recommends that you continue on your current medications as directed. Please refer to the Current Medication list given to you today.  *If you need a refill on your cardiac medications before your next appointment, please call your pharmacy*  Lab Work: Your physician recommends that you return for lab work in: next 1-2 weeks for fasting lipid/liver profile.  If you have labs (blood work) drawn today and your tests are completely normal, you will receive your results only by: Marland Kitchen MyChart Message (if you have MyChart) OR . A paper copy in the mail If you have any lab test that is abnormal or we need to change your treatment, we will call you to review the results.   Testing/Procedures: Your physician has requested that you have a carotid duplex. This test is an ultrasound of the carotid arteries in your neck. It looks at blood flow through these arteries that supply the brain with blood. Allow one hour for this exam. There are no restrictions or special instructions. This procedure is done at [place]. 2nd Floor. To be done in Feb 2023.   Follow-Up: At [facility], you and your health needs are our priority.  As part of our continuing mission to provide you with exceptional heart care, we have created designated Provider Care Teams.  These Care Teams include your primary Cardiologist (physician) and Advanced Practice Providers (APPs -  Physician Assistants and Nurse Practitioners) who all work together to provide you with the care you need, when you need it.  We recommend signing up for the patient portal called "MyChart".  Sign up information is provided on this After Visit Summary.  MyChart is used to connect with patients for Virtual Visits (Telemedicine).  Patients are able to view lab/test results, encounter notes, upcoming appointments, etc.  Non-urgent messages can be sent to your provider as well.   To learn more about what you can do  with MyChart, go to [email or web address].    Your next appointment:   12 month(s)  The format for your next appointment:   In Person  Provider:   [name], MD

## 2020-08-03 NOTE — Assessment & Plan Note (Signed)
History of essential hypertension with blood pressure measured today at 119/62.  He currently is not on any antihypertensive medications.

## 2020-08-06 ENCOUNTER — Other Ambulatory Visit (HOSPITAL_COMMUNITY): Payer: Self-pay | Admitting: Nephrology

## 2020-08-06 DIAGNOSIS — N186 End stage renal disease: Secondary | ICD-10-CM

## 2020-08-09 ENCOUNTER — Ambulatory Visit (HOSPITAL_COMMUNITY): Payer: Medicare Other

## 2020-08-09 ENCOUNTER — Encounter (HOSPITAL_COMMUNITY): Payer: Self-pay

## 2020-09-01 ENCOUNTER — Ambulatory Visit: Payer: Self-pay

## 2020-09-01 ENCOUNTER — Encounter: Payer: Self-pay | Admitting: Physician Assistant

## 2020-09-01 ENCOUNTER — Ambulatory Visit (INDEPENDENT_AMBULATORY_CARE_PROVIDER_SITE_OTHER): Payer: Medicare Other | Admitting: Physician Assistant

## 2020-09-01 DIAGNOSIS — M25551 Pain in right hip: Secondary | ICD-10-CM

## 2020-09-01 NOTE — Progress Notes (Signed)
Office Visit Note   Patient: Jon Anderson           Date of Birth: 1950-10-15           MRN: [id number] Visit Date: 09/01/2020              Requested by: [name], MD [id number] [place],  [place] [address] PCP: [name], MD  Chief Complaint  Patient presents with  . Right Hip - Pain  . Left Foot - Follow-up      HPI: Patient presents in follow-up with a chief complaint of fairly sudden onset of right groin pain.  He also feels weakness in the right hip.  He says his hip feels like it swollen.  He denies any recent fall.  This seemed to get significantly worse 2 weeks ago and now he is using a cane.  He also is asking to have his left foot checked.  He is status post right transmetatarsal amputation last year.  On the left foot he would like to have calluses checked.  He also has pain where his onychomycotic nail is growing into the skin.  Wonders why he has pain on the plantar surface of the great toe as well as his heel.  He has been shaving these calluses.  He recently got orthotics from [facility]: Visit Diagnoses:  1. Pain in right hip     Plan: Do not see any infectious process going on in his left foot.  I have cautioned him against shaving the calluses himself.  He will follow-up with [facility] to see if the orthotics can be adjusted.  Considering the acute onset of this right hip pain I think it is mostly from his hip arthritis but I am going to get a CT scan if this is negative I would refer him to Dr. Ernestina Patches for a steroid injection  Follow-Up Instructions: No follow-ups on file.   Ortho Exam  Patient is alert, oriented, no adenopathy, well-dressed, normal affect, normal respiratory effort. Right hip: Tenderness in the groin.  He has very limited internal rotation.  This reproduces some of his pain.  He has good dorsiflexion plantarflexion is no radicular symptoms Left foot.  Palpable pulse.  He has no calluses he has clawing of the great toe.   He does have ingrowing of the onychomycotic nail.  This was trimmed to the area of pressure.  There is no cellulitis no erythema he does have some tenderness in the heel but no fluctuance no cellulitis no breakdown in skin  Imaging: No results found. No images are attached to the encounter.  Labs: Lab Results  Component Value Date   HGBA1C 6.4 (H) 07/24/2019   HGBA1C 6.5 (H) 07/18/2018   HGBA1C 6.4 (H) 10/16/2017   ESRSEDRATE 22 (H) 07/24/2019   CRP <0.5 07/24/2019   REPTSTATUS 07/29/2019 FINAL 07/24/2019   CULT  07/24/2019    NO GROWTH 5 DAYS Performed at [facility], [place] [address], [place], [place] [id number]      Lab Results  Component Value Date   ALBUMIN 3.1 (L) 12/01/2019   ALBUMIN 3.1 (L) 11/28/2019   ALBUMIN 3.1 (L) 11/26/2019   PREALBUMIN 28.0 07/24/2019    Lab Results  Component Value Date   MG 1.8 04/26/2019   MG 1.8 04/24/2019   MG 2.1 03/22/2017   No results found for: [facility]  Lab Results  Component Value Date   PREALBUMIN 28.0 07/24/2019   CBC  EXTENDED Latest Ref Rng & Units 03/23/2020 02/10/2020 12/01/2019  WBC 4.0 - 10.5 K/uL - - 8.3  RBC 4.22 - 5.81 MIL/uL - - 3.65(L)  HGB 13.0 - 17.0 g/dL 16.3 13.9 10.7(L)  HCT 39.0 - 52.0 % 48.0 41.0 33.9(L)  PLT 150 - 400 K/uL - - 329  NEUTROABS 1.7 - 7.7 K/uL - - -  LYMPHSABS 0.7 - 4.0 K/uL - - -     There is no height or weight on file to calculate BMI.  Orders:  Orders Placed This Encounter  Procedures  . XR HIP UNILAT W OR W/O PELVIS 2-3 VIEWS RIGHT  . CT HIP RIGHT WO CONTRAST   No orders of the defined types were placed in this encounter.    Procedures: No procedures performed  Clinical Data: No additional findings.  ROS:  All other systems negative, except as noted in the HPI. Review of Systems  Objective: Vital Signs: There were no vitals taken for this visit.  Specialty Comments:  No specialty comments available.  PMFS History: Patient Active Problem List   Diagnosis  Date Noted  . Right below-knee amputee ([place]) 11/25/2019  . S/P transmetatarsal amputation of foot, right ([place]) 11/25/2019  . Gangrene of right foot ([place]) 11/19/2019  . History of partial ray amputation of fifth toe of right foot ([place]) 09/09/2019  . Ischemic ulcer diabetic foot ([place]) 09/09/2019  . Subacute osteomyelitis, right ankle and foot ([place])   . Diabetic foot infection ([place]) 07/24/2019  . Chronic diastolic CHF (congestive heart failure) ([place]) 07/24/2019  . Diabetic ulcer of right midfoot associated with diabetes mellitus due to underlying condition, with fat layer exposed ([place])   . Gastroenteritis 04/24/2019  . Allergy, unspecified, initial encounter 01/27/2019  . DM neuropathy with neurologic complication ([place]) 43/32/9518  . GERD (gastroesophageal reflux disease) 02/28/2018  . Nephrolithiasis 02/28/2018  . Sciatic leg pain 02/28/2018  . Snores 02/28/2018  . Orthopnea 02/23/2018  . Elevated troponin 02/23/2018  . HNP (herniated nucleus pulposus), lumbar 07/03/2017  . Encounter for removal of sutures 06/01/2017  . Chest pain in adult 04/27/2017  . Restless leg syndrome, uncontrolled 04/27/2017  . 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 Anemia of chronic disease 03/17/2017  . ESRD (end stage renal disease) on dialysis ([place]) 03/17/2017  . Chronic chest pain 03/17/2017  . Type II diabetes mellitus ([place]) 03/17/2017  . Acute on chronic diastolic heart failure ([place]) 03/17/2017  . Acute heart failure ([place]) 03/17/2017  . Lumbar radiculopathy 01/16/2017  . Pre-transplant evaluation for kidney transplant 06/15/2016  . Pain, unspecified 04/17/2016  . Increased frequency of urination 12/17/2015  . Nocturia 12/17/2015  . Chest pain, non-cardiac 10/09/2015  . Hypercalcemia 05/10/2015  . Other fluid overload 02/24/2015  . 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 Fatty (change of) liver, not elsewhere classified 07/02/2014  . Aftercare  including intermittent dialysis ([place]) 06/24/2014  . Diarrhea, unspecified 06/24/2014  . Fever, unspecified 06/24/2014  . Iron deficiency anemia, unspecified 06/24/2014  . Other specified coagulation defects ([place]) 06/24/2014  . Pruritus, unspecified 06/24/2014  . Secondary hyperparathyroidism of renal origin ([place]) 06/24/2014  . Type 2 diabetes mellitus with diabetic peripheral angiopathy without gangrene ([place]) 06/24/2014  . Acute on chronic renal failure ([place]) 06/16/2014  . Shoulder pain, left 12/15/2013  . Chest pain 12/15/2013  . Claudication ([place]) 12/11/2013  . PVD (peripheral vascular disease) ([place]) 12/11/2013  . Carotid artery disease ([place]) 09/30/2013  . Acute chest pain 11/15/2012  .  Bruit 09/15/2010  . CAD (coronary artery disease) nonobstructive per cath 2012   . Hyperlipidemia LDL goal <70 07/22/2009  . Essential hypertension 07/22/2009   Past Medical History:  Diagnosis Date  . Anemia of chronic disease   . CAD (coronary artery disease)    a.  Myoview 4/11: EF 53%, no scar or ischemia   c. MV 2012 Nl perfusion, apical thinning.  No ischemia or scar.  EF 49%, appears greater by visual estimate.;  d.  Dob stress echo 12/13:  Negative Dob stress echo. There is no evidence of ischemia.  The LVF is normal. b. Normal cors 2016.  . Carotid stenosis    a. <41% RICA, >93% LICA by duplex 10/9022  . Chronic chest pain    occ  . Constipation    chronic  . Dyspnea    occasional with extertion  . ESRD (end stage renal disease) on dialysis [facility])    M-W-F- [name]  . GERD (gastroesophageal reflux disease)   . History of kidney stones    Passed  . HNP (herniated nucleus pulposus), lumbar   . HTN (hypertension)    echo 3/10: EF 60%, LAE  . Hyperlipidemia   . Nephrolithiasis    "passed them all"  . Peripheral arterial disease ([place])    a. s/p PTCA  right   . Pneumonia yrs ago  . Restless legs   . Sciatic leg pain   . Sleep apnea    does not use cpap  . Snores    a. presumed  OSA, pt has refused sleep eval in past.  . Type II diabetes mellitus ([place])    no longer on medications, checks blood glucose at home  . Urinary frequency   . Uses wheelchair   . Walker as ambulation aid     Family History  Problem Relation Age of Onset  . Heart attack Sister        died @ 71  . Cancer Mother        died @ 49; unknown type  . Diabetes Brother        deceased  . Cirrhosis Father        alcohol related  . Diabetes Father   . Esophageal cancer Neg Hx   . Colon cancer Neg Hx   . Pancreatic cancer Neg Hx   . Stomach cancer Neg Hx     Past Surgical History:  Procedure Laterality Date  . ABDOMINAL AORTOGRAM W/LOWER EXTREMITY Bilateral 08/21/2019   Procedure: ABDOMINAL AORTOGRAM W/LOWER EXTREMITY;  Surgeon: Waynetta Sandy, MD;  Location: [facility];  Service: Cardiovascular;  Laterality: Bilateral;  . AMPUTATION Right 07/25/2019   Procedure: RIGHT 5th RAY AMPUTATION;  Surgeon: Newt Minion, MD;  Location: [facility];  Service: Orthopedics;  Laterality: Right;  . AMPUTATION Right 11/19/2019   Procedure: RIGHT TRANSMETATARSAL AMPUTATION;  Surgeon: Newt Minion, MD;  Location: [facility];  Service: Orthopedics;  Laterality: Right;  . ANGIOPLASTY / STENTING FEMORAL Left 12/11/2013   dr berry  . AV FISTULA PLACEMENT Left 03/19/2014   Procedure: CREATION OF ARTERIOVENOUS (AV) FISTULA  LEFT UPPER ARM;  Surgeon: Mal Misty, MD;  Location: [facility];  Service: Vascular;  Laterality: Left;  . BACK SURGERY  01/2018   screws placed   . CARDIAC CATHETERIZATION  2001 and 2010   . COLONOSCOPY W/ BIOPSIES AND POLYPECTOMY    . COLONOSCOPY WITH PROPOFOL N/A 08/01/2016   Procedure: COLONOSCOPY WITH PROPOFOL;  Surgeon: Carol Ada,  MD;  Location: [facility];  Service: Endoscopy;  Laterality: N/A;  . CORONARY ARTERY BYPASS GRAFT  2019   [facility] x 1 bypass  . ESOPHAGOGASTRODUODENOSCOPY (EGD) WITH PROPOFOL N/A 08/01/2016   Procedure: ESOPHAGOGASTRODUODENOSCOPY (EGD) WITH  PROPOFOL;  Surgeon: Carol Ada, MD;  Location: [facility];  Service: Endoscopy;  Laterality: N/A;  . FISTULA SUPERFICIALIZATION Left 02/10/2020   Procedure: 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;  Surgeon: Rosetta Posner, MD;  Location: [facility];  Service: Vascular;  Laterality: Left;  . FISTULA SUPERFICIALIZATION Left 03/23/2020   Procedure: LEFT UPPER EXTREMITY ARTERIOVENOUS FISTULA PLICATION;  Surgeon: Rosetta Posner, MD;  Location: [facility];  Service: Vascular;  Laterality: Left;  . FOOT FRACTURE SURGERY Right    ligament repair  . FRACTURE SURGERY     left forearm  . GRAFT APPLICATION Right 07/14/6597   Procedure: FAT GRAFT APPLICATION;  Surgeon: Evelina Bucy, DPM;  Location: [facility];  Service: Podiatry;  Laterality: Right;  . INGUINAL HERNIA REPAIR Left   . LEFT HEART CATH AND CORS/GRAFTS ANGIOGRAPHY N/A 04/27/2017   Procedure: LEFT HEART CATH AND CORS/GRAFTS ANGIOGRAPHY;  Surgeon: Leonie Man, MD;  Location: [facility];  Service: Cardiovascular;  Laterality: N/A;  . LEFT HEART CATHETERIZATION WITH CORONARY ANGIOGRAM N/A 06/22/2014   Procedure: LEFT HEART CATHETERIZATION WITH CORONARY ANGIOGRAM;  Surgeon: Troy Sine, MD;  Location: [facility] LAB;  Service: Cardiovascular;  Laterality: N/A;  . LOWER EXTREMITY ANGIOGRAM Left 12/11/2013   Procedure: LOWER EXTREMITY ANGIOGRAM;  Surgeon: Lorretta Harp, MD;  Location: [facility] LAB;  Service: Cardiovascular;  Laterality: Left;  . LUMBAR LAMINECTOMY/DECOMPRESSION MICRODISCECTOMY Right 07/03/2017   Procedure: MICRODISCECTOMY LUMBAR FIVE - SACRAL ONE RIGHT;  Surgeon: Consuella Lose, MD;  Location: [facility];  Service: Neurosurgery;  Laterality: Right;  . LUMBAR LAMINECTOMY/DECOMPRESSION MICRODISCECTOMY Right 10/19/2017   Procedure: MICRODISCECTOMY LUMBAR FIVE- SACRAL 1 ONE ;  Surgeon: Consuella Lose, MD;  Location: [facility];  Service: Neurosurgery;  Laterality: Right;  . TONSILLECTOMY AND ADENOIDECTOMY     . WISDOM TOOTH EXTRACTION    . WOUND DEBRIDEMENT Right 05/13/2019   Procedure: DEBRIDEMENT WOUND;  Surgeon: Evelina Bucy, DPM;  Location: [facility];  Service: Podiatry;  Laterality: Right;   Social History   Occupational History  . Occupation: retired  Tobacco Use  . Smoking status: Former Smoker    Packs/day: 1.00    Years: 2.00    Pack years: 2.00    Types: Cigarettes    Quit date: 1978    Years since quitting: 44.4  . Smokeless tobacco: Never Used  Vaping Use  . Vaping Use: Never used  Substance and Sexual Activity  . Alcohol use: Not Currently    Alcohol/week: 0.0 standard drinks    Comment: h/o social drinking  . Drug use: Not Currently    Types: Marijuana    Comment: quit 1986  . Sexual activity: Yes    Birth control/protection: None

## 2020-09-17 ENCOUNTER — Other Ambulatory Visit: Payer: Self-pay | Admitting: *Deleted

## 2020-09-17 DIAGNOSIS — N186 End stage renal disease: Secondary | ICD-10-CM

## 2020-10-01 ENCOUNTER — Other Ambulatory Visit: Payer: Self-pay

## 2020-10-01 ENCOUNTER — Encounter: Payer: Self-pay | Admitting: Podiatry

## 2020-10-01 ENCOUNTER — Ambulatory Visit (INDEPENDENT_AMBULATORY_CARE_PROVIDER_SITE_OTHER): Payer: Medicare Other | Admitting: Podiatry

## 2020-10-01 DIAGNOSIS — E1151 Type 2 diabetes mellitus with diabetic peripheral angiopathy without gangrene: Secondary | ICD-10-CM

## 2020-10-01 DIAGNOSIS — Z89431 Acquired absence of right foot: Secondary | ICD-10-CM

## 2020-10-01 DIAGNOSIS — L84 Corns and callosities: Secondary | ICD-10-CM | POA: Diagnosis not present

## 2020-10-01 DIAGNOSIS — B351 Tinea unguium: Secondary | ICD-10-CM

## 2020-10-01 NOTE — Progress Notes (Signed)
  Subjective:  Patient ID: Jon Anderson, male    DOB: Oct 17, 1950,  MRN: [id number]  70 y.o. male presents with at risk foot care. Patient has h/o amputation of Transmetatarsal amputation right foot.    Patient's blood sugar was 99 mg/dl yesterday.   PCP: [name], MD and last visit was:   Review of Systems: Negative except as noted in the HPI.   Allergies  Allergen Reactions   Kiwi Extract Itching, Swelling and Other (See Comments)    Lips and face swell- breathing not affected   Flexeril [Cyclobenzaprine]     Hands become flimsy, can not hold things   Tape Other (See Comments)    "Plastic" tape causes blisters!!    Objective:  There were no vitals filed for this visit. Constitutional Patient is a pleasant 70 y.o. African American male WD, WN in NAD. AAO x 3.  Vascular Capillary fill time to digits <3 seconds b/l lower extremities. Nonpalpable pedal pulse(s) b/l lower extremities. Pedal hair absent. Lower extremity skin temperature gradient within normal limits. No edema noted b/l lower extremities. No cyanosis or clubbing noted.  Neurologic Normal speech. Protective sensation diminished with 10g monofilament b/l.  Dermatologic Toenails 1-5 left elongated, discolored, dystrophic, thickened, and crumbly with subungual debris and tenderness to dorsal palpation. Hyperkeratotic lesion(s) L hallux and submet head 5 left foot.  No erythema, no edema, no drainage, no fluctuance.  Orthopedic: Normal muscle strength 5/5 to all lower extremity muscle groups bilaterally. Hallux hammertoe left LE. Lower extremity amputation(s): Transmetatarsal amputation right foot. Utilizes cane for ambulation assistance.    Assessment:   1. Onychomycosis   2. Callus   3. S/P transmetatarsal amputation of foot, right ([place])   4. Type II diabetes mellitus with peripheral circulatory disorder [facility])    Plan:  Patient was evaluated and treated and all questions answered. Procedure: Nail  Debridement Rationale: h/o TMA right foot Type of Debridement: manual, sharp debridement. Instrumentation: Nail nipper, rotary burr. Number of Nails: 5 -Examined patient. -Offloaded insert left shoe with felt horse shoe pads. -Continue diabetic shoes daily. -Toenails 1-5 left debrided in length and girth without iatrogenic bleeding with sterile nail nipper and dremel.  -Callus(es) L hallux and submet head 5 left foot gently filed with dremel without complication or incident. Total number debrided =2. -Patient to report any pedal injuries to medical professional immediately. -Patient/POA to call should there be question/concern in the interim.  Return in about 3 months (around 01/01/2021).  Marzetta Board, DPM

## 2020-10-05 ENCOUNTER — Encounter: Payer: Self-pay | Admitting: Radiology

## 2020-10-20 ENCOUNTER — Ambulatory Visit: Payer: Medicare Other | Admitting: Vascular Surgery

## 2020-10-20 ENCOUNTER — Other Ambulatory Visit: Payer: Medicare Other

## 2020-10-29 ENCOUNTER — Other Ambulatory Visit: Payer: Self-pay

## 2020-10-29 ENCOUNTER — Ambulatory Visit (INDEPENDENT_AMBULATORY_CARE_PROVIDER_SITE_OTHER): Payer: Medicare Other | Admitting: Vascular Surgery

## 2020-10-29 ENCOUNTER — Ambulatory Visit (HOSPITAL_COMMUNITY)
Admission: RE | Admit: 2020-10-29 | Discharge: 2020-10-29 | Disposition: A | Discharge status: Home / Self Care | Payer: Medicare Other | Source: Ambulatory Visit | Attending: Vascular Surgery | Admitting: Vascular Surgery

## 2020-10-29 ENCOUNTER — Encounter: Payer: Self-pay | Admitting: Vascular Surgery

## 2020-10-29 ENCOUNTER — Ambulatory Visit (INDEPENDENT_AMBULATORY_CARE_PROVIDER_SITE_OTHER)
Admission: RE | Admit: 2020-10-29 | Discharge: 2020-10-29 | Disposition: A | Discharge status: Home / Self Care | Payer: Medicare Other | Source: Ambulatory Visit | Attending: Vascular Surgery | Admitting: Vascular Surgery

## 2020-10-29 VITALS — BP 85/59 | HR 88 | Temp 98.0°F | Resp 20 | Ht 71.0 in | Wt 166.9 lb

## 2020-10-29 DIAGNOSIS — Z992 Dependence on renal dialysis: Secondary | ICD-10-CM | POA: Diagnosis present

## 2020-10-29 DIAGNOSIS — N186 End stage renal disease: Secondary | ICD-10-CM | POA: Diagnosis present

## 2020-10-29 NOTE — Progress Notes (Signed)
Patient ID: Jon Anderson, male   DOB: 22-Jun-1950, 70 y.o.   MRN: [id number]  Reason for Consult: Follow-up   Referred by [name], MD  Subjective:     HPI:  Jon Anderson is a 70 y.o. male history of end-stage renal disease previously on dialysis via left arm AV fistula which is now thrombosed in May.  He is now on dialysis via catheter.  He is right-hand dominant.  He takes Plavix no other blood thinners.  He is hopeful to continue access in the left upper extremity.  He dialyzes [place]door on Monday Wednesday Friday.  Past Medical History:  Diagnosis Date   Anemia of chronic disease    CAD (coronary artery disease)    a.  Myoview 4/11: EF 53%, no scar or ischemia   c. MV 2012 Nl perfusion, apical thinning.  No ischemia or scar.  EF 49%, appears greater by visual estimate.;  d.  Dob stress echo 12/13:  Negative Dob stress echo. There is no evidence of ischemia.  The LVF is normal. b. Normal cors 2016.   Carotid stenosis    a. <36% RICA, >14% LICA by duplex 07/3152   Chronic chest pain    occ   Constipation    chronic   Dyspnea    occasional with extertion   ESRD (end stage renal disease) on dialysis ([place])    M-W-F- [name]   GERD (gastroesophageal reflux disease)    History of kidney stones    Passed   HNP (herniated nucleus pulposus), lumbar    HTN (hypertension)    echo 3/10: EF 60%, LAE   Hyperlipidemia    Nephrolithiasis    "passed them all"   Peripheral arterial disease ([place])    a. s/p PTCA  right    Pneumonia yrs ago   Restless legs    Sciatic leg pain    Sleep apnea    does not use cpap   Snores    a. presumed OSA, pt has refused sleep eval in past.   Type II diabetes mellitus (HCC)    no longer on medications, checks blood glucose at home   Urinary frequency    Uses wheelchair    Walker as ambulation aid    Family History  Problem Relation Age of Onset   Heart attack Sister        died @ 43   Cancer Mother        died @ 30; unknown type    Diabetes Brother        deceased   Cirrhosis Father        alcohol related   Diabetes Father    Esophageal cancer Neg Hx    Colon cancer Neg Hx    Pancreatic cancer Neg Hx    Stomach cancer Neg Hx    Past Surgical History:  Procedure Laterality Date   ABDOMINAL AORTOGRAM W/LOWER EXTREMITY Bilateral 08/21/2019   Procedure: ABDOMINAL AORTOGRAM W/LOWER EXTREMITY;  Surgeon: Waynetta Sandy, MD;  Location: [facility];  Service: Cardiovascular;  Laterality: Bilateral;   AMPUTATION Right 07/25/2019   Procedure: RIGHT 5th RAY AMPUTATION;  Surgeon: Newt Minion, MD;  Location: [place];  Service: Orthopedics;  Laterality: Right;   AMPUTATION Right 11/19/2019   Procedure: RIGHT TRANSMETATARSAL AMPUTATION;  Surgeon: Newt Minion, MD;  Location: [place];  Service: Orthopedics;  Laterality: Right;   ANGIOPLASTY / STENTING FEMORAL Left 12/11/2013   dr berry   AV FISTULA  PLACEMENT Left 03/19/2014   Procedure: CREATION OF ARTERIOVENOUS (AV) FISTULA  LEFT UPPER ARM;  Surgeon: Mal Misty, MD;  Location: [place];  Service: Vascular;  Laterality: Left;   BACK SURGERY  01/2018   screws placed    CARDIAC CATHETERIZATION  2001 and 2010    COLONOSCOPY W/ BIOPSIES AND POLYPECTOMY     COLONOSCOPY WITH PROPOFOL N/A 08/01/2016   Procedure: COLONOSCOPY WITH PROPOFOL;  Surgeon: Carol Ada, MD;  Location: [facility];  Service: Endoscopy;  Laterality: N/A;   CORONARY ARTERY BYPASS GRAFT  2019   [facility] x 1 bypass   ESOPHAGOGASTRODUODENOSCOPY (EGD) WITH PROPOFOL N/A 08/01/2016   Procedure: ESOPHAGOGASTRODUODENOSCOPY (EGD) WITH PROPOFOL;  Surgeon: Carol Ada, MD;  Location: [facility];  Service: Endoscopy;  Laterality: N/A;   FISTULA SUPERFICIALIZATION Left 02/10/2020   Procedure: 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;  Surgeon: Rosetta Posner, MD;  Location: [place];  Service: Vascular;  Laterality: Left;   FISTULA SUPERFICIALIZATION Left 03/23/2020   Procedure: LEFT UPPER  EXTREMITY ARTERIOVENOUS FISTULA PLICATION;  Surgeon: Rosetta Posner, MD;  Location: [place];  Service: Vascular;  Laterality: Left;   FOOT FRACTURE SURGERY Right    ligament repair   FRACTURE SURGERY     left forearm   GRAFT APPLICATION Right 04/16/107   Procedure: FAT GRAFT APPLICATION;  Surgeon: Evelina Bucy, DPM;  Location: [place];  Service: Podiatry;  Laterality: Right;   INGUINAL HERNIA REPAIR Left    LEFT HEART CATH AND CORS/GRAFTS ANGIOGRAPHY N/A 04/27/2017   Procedure: LEFT HEART CATH AND CORS/GRAFTS ANGIOGRAPHY;  Surgeon: Leonie Man, MD;  Location: [place] [facility];  Service: Cardiovascular;  Laterality: N/A;   LEFT HEART CATHETERIZATION WITH CORONARY ANGIOGRAM N/A 06/22/2014   Procedure: LEFT HEART CATHETERIZATION WITH CORONARY ANGIOGRAM;  Surgeon: Troy Sine, MD;  Location: [facility];  Service: Cardiovascular;  Laterality: N/A;   LOWER EXTREMITY ANGIOGRAM Left 12/11/2013   Procedure: LOWER EXTREMITY ANGIOGRAM;  Surgeon: Lorretta Harp, MD;  Location: [facility];  Service: Cardiovascular;  Laterality: Left;   LUMBAR LAMINECTOMY/DECOMPRESSION MICRODISCECTOMY Right 07/03/2017   Procedure: MICRODISCECTOMY LUMBAR FIVE - SACRAL ONE RIGHT;  Surgeon: Consuella Lose, MD;  Location: [place];  Service: Neurosurgery;  Laterality: Right;   LUMBAR LAMINECTOMY/DECOMPRESSION MICRODISCECTOMY Right 10/19/2017   Procedure: MICRODISCECTOMY LUMBAR FIVE- SACRAL 1 ONE ;  Surgeon: Consuella Lose, MD;  Location: [place];  Service: Neurosurgery;  Laterality: Right;   TONSILLECTOMY AND ADENOIDECTOMY     WISDOM TOOTH EXTRACTION     WOUND DEBRIDEMENT Right 05/13/2019   Procedure: DEBRIDEMENT WOUND;  Surgeon: Evelina Bucy, DPM;  Location: [place];  Service: Podiatry;  Laterality: Right;    Short Social History:  Social History   Tobacco Use   Smoking status: Former    Packs/day: 1.00    Years: 2.00    Pack years: 2.00    Types: Cigarettes    Quit  date: 1978    Years since quitting: 44.5   Smokeless tobacco: Never  Substance Use Topics   Alcohol use: Not Currently    Alcohol/week: 0.0 standard drinks    Comment: h/o social drinking    Allergies  Allergen Reactions   Kiwi Extract Itching, Swelling and Other (See Comments)    Lips and face swell- breathing not affected   Flexeril [Cyclobenzaprine]     Hands become flimsy, can not hold things   Tape Other (See Comments)    "Plastic" tape causes  blisters!!    Current Outpatient Medications  Medication Sig Dispense Refill   aspirin EC 81 MG tablet Take 1 tablet (81 mg total) by mouth daily.     atorvastatin (LIPITOR) 40 MG tablet Take 1 tablet (40 mg total) by mouth daily at 6 PM. 30 tablet 5   calcitRIOL (ROCALTROL) 0.25 MCG capsule 1 capsule 3 TIMES DAILY (route: oral)     calcium acetate (PHOSLO) 667 MG capsule Take 2 capsules (1,334 mg total) by mouth 2 (two) times daily with a meal. 180 capsule 0   Calcium Acetate, Phos Binder, (CALCIUM ACETATE PO) 2 capsule 3 TIMES DAILY (route: oral)     clopidogrel (PLAVIX) 75 MG tablet 1 tablet BEFORE BREAKFAST (route: oral)     gabapentin (NEURONTIN) 100 MG capsule 2 capsule DAILY (route: oral)     HYDROcodone-acetaminophen (NORCO/VICODIN) 5-325 MG tablet Take 1 tablet by mouth every 12 (twelve) hours as needed for moderate pain. 20 tablet 0   loperamide (IMODIUM) 2 MG capsule Take 2 mg by mouth as needed for diarrhea or loose stools.     midodrine (PROAMATINE) 5 MG tablet 5 mg 3 TIMES A WEEK (route: oral)     multivitamin (RENA-VIT) TABS tablet Take 1 tablet by mouth at bedtime. 30 tablet 0   Naldemedine Tosylate (SYMPROIC) 0.2 MG TABS 1 tablet DAILY (route: oral)     oxycodone (OXY-IR) 5 MG capsule 5 mg EVERY 4 HOURS (route: oral)     Pantoprazole Sodium (INVESTIGATIONAL PANTOPRAZOLE) 40 MG tablet [facility] [id number] 1 tablet DAILY (route: oral)     pentoxifylline (TRENTAL) 400 MG CR tablet Take 1 tablet (400 mg total) by mouth daily.  (Patient taking differently: Take 400 mg by mouth at bedtime.) 90 tablet 3   polyethylene glycol (MIRALAX / GLYCOLAX) packet Take 17 g by mouth daily as needed for moderate constipation. 14 each 0   traMADol (ULTRAM) 50 MG tablet 1 tablet EVERY 6 HOURS (route: oral)     lipase/protease/amylase (CREON) 36000 UNITS CPEP capsule Take 1 capsule (36,000 Units total) by mouth daily. 180 capsule 0   Pancrelipase, Lip-Prot-Amyl, (CREON PO) 1 capsule 2 TIMES DAILY (route: oral) (Patient not taking: Reported on 10/29/2020)     No current facility-administered medications for this visit.    Review of Systems  Constitutional:  Constitutional negative. HENT: HENT negative.  Eyes: Eyes negative.  Respiratory: Respiratory negative.  Cardiovascular: Cardiovascular negative.  GI: Gastrointestinal negative.  Musculoskeletal: Musculoskeletal negative.  Skin: Skin negative.  Neurological: Neurological negative. Hematologic: Hematologic/lymphatic negative.  Psychiatric: Psychiatric negative.       Objective:  Objective   Vitals:   10/29/20 1548  BP: (!) 85/59  Pulse: 88  Resp: 20  Temp: 98 F (36.7 C)  SpO2: 95%  Weight: 166 lb 14.4 oz (75.7 kg)  Height: 5\' 11"  (1.803 m)   Body mass index is 23.28 kg/m.  Physical Exam HENT:     Head: Normocephalic.     Nose:     Comments: Wearing a mask Eyes:     Pupils: Pupils are equal, round, and reactive to light.  Cardiovascular:     Pulses:          Radial pulses are 1+ on the right side and 1+ on the left side.  Pulmonary:     Effort: Pulmonary effort is normal.  Abdominal:     General: Abdomen is flat.     Palpations: Abdomen is soft.  Musculoskeletal:     Cervical back: Normal range of  motion.     Comments: Thrombosed left upper extremity AV fistula  Skin:    General: Skin is warm.     Capillary Refill: Capillary refill takes less than 2 seconds.  Neurological:     General: No focal deficit present.     Mental Status: He is alert.   Psychiatric:        Mood and Affect: Mood normal.        Thought Content: Thought content normal.        Judgment: Judgment normal.    Data: +-----------------+-------------+----------+---------+  Right Cephalic   Diameter (cm)Depth (cm)Findings   +-----------------+-------------+----------+---------+  Shoulder             0.33        0.63              +-----------------+-------------+----------+---------+  Prox upper arm       0.26        0.44   branching  +-----------------+-------------+----------+---------+  Mid upper arm        0.32        0.24              +-----------------+-------------+----------+---------+  Dist upper arm       0.16        0.32              +-----------------+-------------+----------+---------+  Antecubital fossa    0.31        0.24   branching  +-----------------+-------------+----------+---------+  Prox forearm         0.24        0.37              +-----------------+-------------+----------+---------+  Mid forearm          0.28        0.23   branching  +-----------------+-------------+----------+---------+  Dist forearm         0.22        0.27   branching  +-----------------+-------------+----------+---------+  Wrist                0.16        0.26              +-----------------+-------------+----------+---------+   +-----------------+-------------+----------+---------+  Right Basilic    Diameter (cm)Depth (cm)Findings   +-----------------+-------------+----------+---------+  Prox upper arm       0.49        1.21              +-----------------+-------------+----------+---------+  Mid upper arm        0.48        0.76              +-----------------+-------------+----------+---------+  Dist upper arm       0.50        0.42   branching  +-----------------+-------------+----------+---------+  Antecubital fossa    0.39        0.39               +-----------------+-------------+----------+---------+  Prox forearm         0.29        0.17   branching  +-----------------+-------------+----------+---------+  Mid forearm          0.25        0.24              +-----------------+-------------+----------+---------+  Distal forearm       0.23        0.18              +-----------------+-------------+----------+---------+  Wrist                0.15        0.15              +-----------------+-------------+----------+---------+   +-------------+-------------+----------+---------+  Left CephalicDiameter (cm)Depth (cm)Findings   +-------------+-------------+----------+---------+  Prox forearm     0.21        0.37              +-------------+-------------+----------+---------+  Mid forearm      0.20        0.17   branching  +-------------+-------------+----------+---------+  Dist forearm     0.17        0.19              +-------------+-------------+----------+---------+  Wrist            0.18        0.22              +-------------+-------------+----------+---------+   +-----------------+-------------+----------+---------+  Left Basilic     Diameter (cm)Depth (cm)Findings   +-----------------+-------------+----------+---------+  Prox upper arm       0.55        0.88              +-----------------+-------------+----------+---------+  Mid upper arm        0.45        0.86   branching  +-----------------+-------------+----------+---------+  Dist upper arm       0.45        0.49   branching  +-----------------+-------------+----------+---------+  Antecubital fossa    0.48        0.35   branching  +-----------------+-------------+----------+---------+  Prox forearm         0.21        0.22   branching  +-----------------+-------------+----------+---------+  Mid forearm          0.18        0.22               +-----------------+-------------+----------+---------+  Distal forearm       0.16        0.25              +-----------------+-------------+----------+---------+  Wrist                0.14        0.22              +-----------------+-------------+----------+---------+   Right Pre-Dialysis Findings:  +-----------------------+----------+--------------------+--------+--------+   Location               PSV (cm/s)Intralum. Diam.  (cm)WaveformComments  +-----------------------+----------+--------------------+--------+--------+   Brachial Antecub. fossa69        0.51                                    +-----------------------+----------+--------------------+--------+--------+   Radial Art at Wrist    75        0.22                                    +-----------------------+----------+--------------------+--------+--------+   Ulnar Art at Wrist     78        0.22                                    +-----------------------+----------+--------------------+--------+--------+  Left Pre-Dialysis Findings:  +-----------------------+----------+--------------------+--------+--------+   Location               PSV (cm/s)Intralum. Diam.  (cm)WaveformComments  +-----------------------+----------+--------------------+--------+--------+   Brachial Antecub. fossa86        0.50                                    +-----------------------+----------+--------------------+--------+--------+   Radial Art at Wrist    82        0.17                                    +-----------------------+----------+--------------------+--------+--------+   Ulnar Art at Wrist     89        0.20                                    +-----------------------+----------+--------------------+--------+--------+          Assessment/Plan:     70 year old male on dialysis via catheter with thrombosed left upper extremity cephalic vein fistula.  He  appears to have a suitable basilic vein for fistula creation.  We discussed the procedure including the need for 2 procedures we have also discussed the risk and benefits.  We will plan for left upper extremity for stage basilic vein fistula versus graft on a nondialysis day in the near future.  He can continue Plavix.     Waynetta Sandy MD [facility]

## 2020-10-29 NOTE — H&P (View-Only) (Signed)
Patient ID: Jon Anderson, male   DOB: 03-06-1951, 70 y.o.   MRN: [id number]  Reason for Consult: Follow-up   Referred by [name], MD  Subjective:     HPI:  Jon Anderson is a 70 y.o. male history of end-stage renal disease previously on dialysis via left arm AV fistula which is now thrombosed in May.  He is now on dialysis via catheter.  He is right-hand dominant.  He takes Plavix no other blood thinners.  He is hopeful to continue access in the left upper extremity.  He dialyzes next-door on Monday Wednesday Friday.  Past Medical History:  Diagnosis Date   Anemia of chronic disease    CAD (coronary artery disease)    a.  Myoview 4/11: EF 53%, no scar or ischemia   c. MV 2012 Nl perfusion, apical thinning.  No ischemia or scar.  EF 49%, appears greater by visual estimate.;  d.  Dob stress echo 12/13:  Negative Dob stress echo. There is no evidence of ischemia.  The LVF is normal. b. Normal cors 2016.   Carotid stenosis    a. <24% RICA, >26% LICA by duplex 11/3417   Chronic chest pain    occ   Constipation    chronic   Dyspnea    occasional with extertion   ESRD (end stage renal disease) on dialysis ([place])    M-W-F- [name]   GERD (gastroesophageal reflux disease)    History of kidney stones    Passed   HNP (herniated nucleus pulposus), lumbar    HTN (hypertension)    echo 3/10: EF 60%, LAE   Hyperlipidemia    Nephrolithiasis    "passed them all"   Peripheral arterial disease ([place])    a. s/p PTCA  right    Pneumonia yrs ago   Restless legs    Sciatic leg pain    Sleep apnea    does not use cpap   Snores    a. presumed OSA, pt has refused sleep eval in past.   Type II diabetes mellitus (HCC)    no longer on medications, checks blood glucose at home   Urinary frequency    Uses wheelchair    Walker as ambulation aid    Family History  Problem Relation Age of Onset   Heart attack Sister        died @ 70   Cancer Mother        died @ 31; unknown type    Diabetes Brother        deceased   Cirrhosis Father        alcohol related   Diabetes Father    Esophageal cancer Neg Hx    Colon cancer Neg Hx    Pancreatic cancer Neg Hx    Stomach cancer Neg Hx    Past Surgical History:  Procedure Laterality Date   ABDOMINAL AORTOGRAM W/LOWER EXTREMITY Bilateral 08/21/2019   Procedure: ABDOMINAL AORTOGRAM W/LOWER EXTREMITY;  Surgeon: Waynetta Sandy, MD;  Location: [facility];  Service: Cardiovascular;  Laterality: Bilateral;   AMPUTATION Right 07/25/2019   Procedure: RIGHT 5th RAY AMPUTATION;  Surgeon: Newt Minion, MD;  Location: [place];  Service: Orthopedics;  Laterality: Right;   AMPUTATION Right 11/19/2019   Procedure: RIGHT TRANSMETATARSAL AMPUTATION;  Surgeon: Newt Minion, MD;  Location: [facility];  Service: Orthopedics;  Laterality: Right;   ANGIOPLASTY / STENTING FEMORAL Left 12/11/2013   dr berry   AV FISTULA  PLACEMENT Left 03/19/2014   Procedure: CREATION OF ARTERIOVENOUS (AV) FISTULA  LEFT UPPER ARM;  Surgeon: Mal Misty, MD;  Location: [facility];  Service: Vascular;  Laterality: Left;   BACK SURGERY  01/2018   screws placed    CARDIAC CATHETERIZATION  2001 and 2010    COLONOSCOPY W/ BIOPSIES AND POLYPECTOMY     COLONOSCOPY WITH PROPOFOL N/A 08/01/2016   Procedure: COLONOSCOPY WITH PROPOFOL;  Surgeon: Carol Ada, MD;  Location: [facility];  Service: Endoscopy;  Laterality: N/A;   CORONARY ARTERY BYPASS GRAFT  2019   [facility] x [facility] bypass   ESOPHAGOGASTRODUODENOSCOPY (EGD) WITH PROPOFOL N/A 08/01/2016   Procedure: ESOPHAGOGASTRODUODENOSCOPY (EGD) WITH PROPOFOL;  Surgeon: Carol Ada, MD;  Location: [facility];  Service: Endoscopy;  Laterality: N/A;   FISTULA SUPERFICIALIZATION Left 02/10/2020   Procedure: 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;  Surgeon: Rosetta Posner, MD;  Location: [place];  Service: Vascular;  Laterality: Left;   FISTULA SUPERFICIALIZATION Left 03/23/2020   Procedure: LEFT UPPER  EXTREMITY ARTERIOVENOUS FISTULA PLICATION;  Surgeon: Rosetta Posner, MD;  Location: [facility];  Service: Vascular;  Laterality: Left;   FOOT FRACTURE SURGERY Right    ligament repair   FRACTURE SURGERY     left forearm   GRAFT APPLICATION Right 08/17/9355   Procedure: FAT GRAFT APPLICATION;  Surgeon: Evelina Bucy, DPM;  Location: [place];  Service: Podiatry;  Laterality: Right;   INGUINAL HERNIA REPAIR Left    LEFT HEART CATH AND CORS/GRAFTS ANGIOGRAPHY N/A 04/27/2017   Procedure: LEFT HEART CATH AND CORS/GRAFTS ANGIOGRAPHY;  Surgeon: Leonie Man, MD;  Location: [facility];  Service: Cardiovascular;  Laterality: N/A;   LEFT HEART CATHETERIZATION WITH CORONARY ANGIOGRAM N/A 06/22/2014   Procedure: LEFT HEART CATHETERIZATION WITH CORONARY ANGIOGRAM;  Surgeon: Troy Sine, MD;  Location: [facility];  Service: Cardiovascular;  Laterality: N/A;   LOWER EXTREMITY ANGIOGRAM Left 12/11/2013   Procedure: LOWER EXTREMITY ANGIOGRAM;  Surgeon: Lorretta Harp, MD;  Location: [facility];  Service: Cardiovascular;  Laterality: Left;   LUMBAR LAMINECTOMY/DECOMPRESSION MICRODISCECTOMY Right 07/03/2017   Procedure: MICRODISCECTOMY LUMBAR FIVE - SACRAL ONE RIGHT;  Surgeon: Consuella Lose, MD;  Location: [place];  Service: Neurosurgery;  Laterality: Right;   LUMBAR LAMINECTOMY/DECOMPRESSION MICRODISCECTOMY Right 10/19/2017   Procedure: MICRODISCECTOMY LUMBAR FIVE- SACRAL 1 ONE ;  Surgeon: Consuella Lose, MD;  Location: [place];  Service: Neurosurgery;  Laterality: Right;   TONSILLECTOMY AND ADENOIDECTOMY     WISDOM TOOTH EXTRACTION     WOUND DEBRIDEMENT Right 05/13/2019   Procedure: DEBRIDEMENT WOUND;  Surgeon: Evelina Bucy, DPM;  Location: [place];  Service: Podiatry;  Laterality: Right;    Short Social History:  Social History   Tobacco Use   Smoking status: Former    Packs/day: 1.00    Years: 2.00    Pack years: 2.00    Types: Cigarettes    Quit  date: 1978    Years since quitting: 44.5   Smokeless tobacco: Never  Substance Use Topics   Alcohol use: Not Currently    Alcohol/week: 0.0 standard drinks    Comment: h/o social drinking    Allergies  Allergen Reactions   Kiwi Extract Itching, Swelling and Other (See Comments)    Lips and face swell- breathing not affected   Flexeril [Cyclobenzaprine]     Hands become flimsy, can not hold things   Tape Other (See Comments)    "Plastic" tape causes  blisters!!    Current Outpatient Medications  Medication Sig Dispense Refill   aspirin EC 81 MG tablet Take 1 tablet (81 mg total) by mouth daily.     atorvastatin (LIPITOR) 40 MG tablet Take 1 tablet (40 mg total) by mouth daily at 6 PM. 30 tablet 5   calcitRIOL (ROCALTROL) 0.25 MCG capsule 1 capsule 3 TIMES DAILY (route: oral)     calcium acetate (PHOSLO) 667 MG capsule Take 2 capsules (1,334 mg total) by mouth 2 (two) times daily with a meal. 180 capsule 0   Calcium Acetate, Phos Binder, (CALCIUM ACETATE PO) 2 capsule 3 TIMES DAILY (route: oral)     clopidogrel (PLAVIX) 75 MG tablet 1 tablet BEFORE BREAKFAST (route: oral)     gabapentin (NEURONTIN) 100 MG capsule 2 capsule DAILY (route: oral)     HYDROcodone-acetaminophen (NORCO/VICODIN) 5-325 MG tablet Take 1 tablet by mouth every 12 (twelve) hours as needed for moderate pain. 20 tablet 0   loperamide (IMODIUM) 2 MG capsule Take 2 mg by mouth as needed for diarrhea or loose stools.     midodrine (PROAMATINE) 5 MG tablet 5 mg 3 TIMES A WEEK (route: oral)     multivitamin (RENA-VIT) TABS tablet Take 1 tablet by mouth at bedtime. 30 tablet 0   Naldemedine Tosylate (SYMPROIC) 0.2 MG TABS 1 tablet DAILY (route: oral)     oxycodone (OXY-IR) 5 MG capsule 5 mg EVERY 4 HOURS (route: oral)     Pantoprazole Sodium (INVESTIGATIONAL PANTOPRAZOLE) 40 MG tablet Alliance [id number] 1 tablet DAILY (route: oral)     pentoxifylline (TRENTAL) 400 MG CR tablet Take 1 tablet (400 mg total) by mouth daily.  (Patient taking differently: Take 400 mg by mouth at bedtime.) 90 tablet 3   polyethylene glycol (MIRALAX / GLYCOLAX) packet Take 17 g by mouth daily as needed for moderate constipation. 14 each 0   traMADol (ULTRAM) 50 MG tablet 1 tablet EVERY 6 HOURS (route: oral)     lipase/protease/amylase (CREON) 36000 UNITS CPEP capsule Take 1 capsule (36,000 Units total) by mouth daily. 180 capsule 0   Pancrelipase, Lip-Prot-Amyl, (CREON PO) 1 capsule 2 TIMES DAILY (route: oral) (Patient not taking: Reported on 10/29/2020)     No current facility-administered medications for this visit.    Review of Systems  Constitutional:  Constitutional negative. HENT: HENT negative.  Eyes: Eyes negative.  Respiratory: Respiratory negative.  Cardiovascular: Cardiovascular negative.  GI: Gastrointestinal negative.  Musculoskeletal: Musculoskeletal negative.  Skin: Skin negative.  Neurological: Neurological negative. Hematologic: Hematologic/lymphatic negative.  Psychiatric: Psychiatric negative.       Objective:  Objective   Vitals:   10/29/20 1548  BP: (!) 85/59  Pulse: 88  Resp: 20  Temp: 98 F (36.7 C)  SpO2: 95%  Weight: 166 lb 14.4 oz (75.7 kg)  Height: 5\' 11"  (1.803 m)   Body mass index is 23.28 kg/m.  Physical Exam HENT:     Head: Normocephalic.     Nose:     Comments: Wearing a mask Eyes:     Pupils: Pupils are equal, round, and reactive to light.  Cardiovascular:     Pulses:          Radial pulses are 1+ on the right side and 1+ on the left side.  Pulmonary:     Effort: Pulmonary effort is normal.  Abdominal:     General: Abdomen is flat.     Palpations: Abdomen is soft.  Musculoskeletal:     Cervical back: Normal range of  motion.     Comments: Thrombosed left upper extremity AV fistula  Skin:    General: Skin is warm.     Capillary Refill: Capillary refill takes less than 2 seconds.  Neurological:     General: No focal deficit present.     Mental Status: He is alert.   Psychiatric:        Mood and Affect: Mood normal.        Thought Content: Thought content normal.        Judgment: Judgment normal.    Data: +-----------------+-------------+----------+---------+  Right Cephalic   Diameter (cm)Depth (cm)Findings   +-----------------+-------------+----------+---------+  Shoulder             0.33        0.63              +-----------------+-------------+----------+---------+  Prox upper arm       0.26        0.44   branching  +-----------------+-------------+----------+---------+  Mid upper arm        0.32        0.24              +-----------------+-------------+----------+---------+  Dist upper arm       0.16        0.32              +-----------------+-------------+----------+---------+  Antecubital fossa    0.31        0.24   branching  +-----------------+-------------+----------+---------+  Prox forearm         0.24        0.37              +-----------------+-------------+----------+---------+  Mid forearm          0.28        0.23   branching  +-----------------+-------------+----------+---------+  Dist forearm         0.22        0.27   branching  +-----------------+-------------+----------+---------+  Wrist                0.16        0.26              +-----------------+-------------+----------+---------+   +-----------------+-------------+----------+---------+  Right Basilic    Diameter (cm)Depth (cm)Findings   +-----------------+-------------+----------+---------+  Prox upper arm       0.49        1.21              +-----------------+-------------+----------+---------+  Mid upper arm        0.48        0.76              +-----------------+-------------+----------+---------+  Dist upper arm       0.50        0.42   branching  +-----------------+-------------+----------+---------+  Antecubital fossa    0.39        0.39               +-----------------+-------------+----------+---------+  Prox forearm         0.29        0.17   branching  +-----------------+-------------+----------+---------+  Mid forearm          0.25        0.24              +-----------------+-------------+----------+---------+  Distal forearm       0.23        0.18              +-----------------+-------------+----------+---------+  Wrist                0.15        0.15              +-----------------+-------------+----------+---------+   +-------------+-------------+----------+---------+  Left CephalicDiameter (cm)Depth (cm)Findings   +-------------+-------------+----------+---------+  Prox forearm     0.21        0.37              +-------------+-------------+----------+---------+  Mid forearm      0.20        0.17   branching  +-------------+-------------+----------+---------+  Dist forearm     0.17        0.19              +-------------+-------------+----------+---------+  Wrist            0.18        0.22              +-------------+-------------+----------+---------+   +-----------------+-------------+----------+---------+  Left Basilic     Diameter (cm)Depth (cm)Findings   +-----------------+-------------+----------+---------+  Prox upper arm       0.55        0.88              +-----------------+-------------+----------+---------+  Mid upper arm        0.45        0.86   branching  +-----------------+-------------+----------+---------+  Dist upper arm       0.45        0.49   branching  +-----------------+-------------+----------+---------+  Antecubital fossa    0.48        0.35   branching  +-----------------+-------------+----------+---------+  Prox forearm         0.21        0.22   branching  +-----------------+-------------+----------+---------+  Mid forearm          0.18        0.22               +-----------------+-------------+----------+---------+  Distal forearm       0.16        0.25              +-----------------+-------------+----------+---------+  Wrist                0.14        0.22              +-----------------+-------------+----------+---------+   Right Pre-Dialysis Findings:  +-----------------------+----------+--------------------+--------+--------+   Location               PSV (cm/s)Intralum. Diam.  (cm)WaveformComments  +-----------------------+----------+--------------------+--------+--------+   Brachial Antecub. fossa69        0.51                                    +-----------------------+----------+--------------------+--------+--------+   Radial Art at Wrist    75        0.22                                    +-----------------------+----------+--------------------+--------+--------+   Ulnar Art at Wrist     78        0.22                                    +-----------------------+----------+--------------------+--------+--------+  Left Pre-Dialysis Findings:  +-----------------------+----------+--------------------+--------+--------+   Location               PSV (cm/s)Intralum. Diam.  (cm)WaveformComments  +-----------------------+----------+--------------------+--------+--------+   Brachial Antecub. fossa86        0.50                                    +-----------------------+----------+--------------------+--------+--------+   Radial Art at Wrist    82        0.17                                    +-----------------------+----------+--------------------+--------+--------+   Ulnar Art at Wrist     89        0.20                                    +-----------------------+----------+--------------------+--------+--------+          Assessment/Plan:     70 year old male on dialysis via catheter with thrombosed left upper extremity cephalic vein fistula.  He  appears to have a suitable basilic vein for fistula creation.  We discussed the procedure including the need for 2 procedures we have also discussed the risk and benefits.  We will plan for left upper extremity for stage basilic vein fistula versus graft on a nondialysis day in the near future.  He can continue Plavix.     Waynetta Sandy MD [name]

## 2020-11-05 ENCOUNTER — Other Ambulatory Visit: Payer: Self-pay

## 2020-11-05 ENCOUNTER — Encounter (HOSPITAL_COMMUNITY): Payer: Self-pay | Admitting: Vascular Surgery

## 2020-11-05 NOTE — Progress Notes (Signed)
Spoke with pt for pre-op call. Pt has hx of CAD with CABG in 2019. Dr. Gwenlyn Found is his cardiologist. Denies any recent chest pain or shortness of breath. Pt is a type 2 diabetic and is on no medications. States his fasting blood sugar is usually between 90-110 and his last A1C was 6.4 on 07/24/19. Instructed pt to check his blood sugar when he gets up Tuesday AM. If blood sugar is 70 or below, treat with 1/2 cup of clear juice (apple or cranberry) and recheck blood sugar 15 minutes after drinking juice. Instructed pt to let his nurse know on arrival Tuesday AM if he had to drink any juice. He voiced understanding.  Pt's surgery is scheduled as ambulatory so no Covid test is required prior to surgery. Pt denies any Covid symptoms.

## 2020-11-09 ENCOUNTER — Encounter (HOSPITAL_COMMUNITY): Payer: Self-pay | Admitting: Emergency Medicine

## 2020-11-09 ENCOUNTER — Other Ambulatory Visit: Payer: Self-pay

## 2020-11-09 ENCOUNTER — Emergency Department (HOSPITAL_COMMUNITY): Payer: Medicare Other | Admitting: Certified Registered Nurse Anesthetist

## 2020-11-09 ENCOUNTER — Ambulatory Visit (HOSPITAL_COMMUNITY): Payer: Medicare Other | Admitting: Certified Registered"

## 2020-11-09 ENCOUNTER — Encounter (HOSPITAL_COMMUNITY)
Admission: EM | Disposition: A | Discharge status: Home Health | Payer: Self-pay | Source: Home / Self Care | Attending: Emergency Medicine

## 2020-11-09 ENCOUNTER — Encounter (HOSPITAL_COMMUNITY)
Admission: RE | Disposition: A | Discharge status: Home / Self Care | Payer: Self-pay | Source: Home / Self Care | Attending: Vascular Surgery

## 2020-11-09 ENCOUNTER — Ambulatory Visit (HOSPITAL_COMMUNITY)
Admission: RE | Admit: 2020-11-09 | Discharge: 2020-11-09 | Disposition: A | Discharge status: Home / Self Care | Payer: Medicare Other | Attending: Vascular Surgery | Admitting: Vascular Surgery

## 2020-11-09 ENCOUNTER — Encounter (HOSPITAL_COMMUNITY): Payer: Self-pay | Admitting: Vascular Surgery

## 2020-11-09 ENCOUNTER — Ambulatory Visit (HOSPITAL_COMMUNITY)
Admission: EM | Admit: 2020-11-09 | Discharge: 2020-11-09 | Disposition: A | Discharge status: Home Health | Payer: Medicare Other | Source: Home / Self Care | Attending: Emergency Medicine | Admitting: Emergency Medicine

## 2020-11-09 DIAGNOSIS — Z7902 Long term (current) use of antithrombotics/antiplatelets: Secondary | ICD-10-CM | POA: Insufficient documentation

## 2020-11-09 DIAGNOSIS — I5032 Chronic diastolic (congestive) heart failure: Secondary | ICD-10-CM | POA: Insufficient documentation

## 2020-11-09 DIAGNOSIS — I97638 Postprocedural hematoma of a circulatory system organ or structure following other circulatory system procedure: Secondary | ICD-10-CM

## 2020-11-09 DIAGNOSIS — E1122 Type 2 diabetes mellitus with diabetic chronic kidney disease: Secondary | ICD-10-CM | POA: Insufficient documentation

## 2020-11-09 DIAGNOSIS — Z91018 Allergy to other foods: Secondary | ICD-10-CM | POA: Diagnosis not present

## 2020-11-09 DIAGNOSIS — Z79899 Other long term (current) drug therapy: Secondary | ICD-10-CM | POA: Insufficient documentation

## 2020-11-09 DIAGNOSIS — N186 End stage renal disease: Secondary | ICD-10-CM | POA: Insufficient documentation

## 2020-11-09 DIAGNOSIS — T82868A Thrombosis of vascular prosthetic devices, implants and grafts, initial encounter: Secondary | ICD-10-CM | POA: Insufficient documentation

## 2020-11-09 DIAGNOSIS — I132 Hypertensive heart and chronic kidney disease with heart failure and with stage 5 chronic kidney disease, or end stage renal disease: Secondary | ICD-10-CM | POA: Insufficient documentation

## 2020-11-09 DIAGNOSIS — Z87891 Personal history of nicotine dependence: Secondary | ICD-10-CM | POA: Insufficient documentation

## 2020-11-09 DIAGNOSIS — L7632 Postprocedural hematoma of skin and subcutaneous tissue following other procedure: Secondary | ICD-10-CM | POA: Insufficient documentation

## 2020-11-09 DIAGNOSIS — M96840 Postprocedural hematoma of a musculoskeletal structure following a musculoskeletal system procedure: Secondary | ICD-10-CM | POA: Insufficient documentation

## 2020-11-09 DIAGNOSIS — I120 Hypertensive chronic kidney disease with stage 5 chronic kidney disease or end stage renal disease: Secondary | ICD-10-CM | POA: Insufficient documentation

## 2020-11-09 DIAGNOSIS — Y832 Surgical operation with anastomosis, bypass or graft as the cause of abnormal reaction of the patient, or of later complication, without mention of misadventure at the time of the procedure: Secondary | ICD-10-CM | POA: Insufficient documentation

## 2020-11-09 DIAGNOSIS — Z7982 Long term (current) use of aspirin: Secondary | ICD-10-CM | POA: Diagnosis not present

## 2020-11-09 DIAGNOSIS — Z888 Allergy status to other drugs, medicaments and biological substances status: Secondary | ICD-10-CM | POA: Insufficient documentation

## 2020-11-09 DIAGNOSIS — Z833 Family history of diabetes mellitus: Secondary | ICD-10-CM | POA: Insufficient documentation

## 2020-11-09 DIAGNOSIS — E1151 Type 2 diabetes mellitus with diabetic peripheral angiopathy without gangrene: Secondary | ICD-10-CM | POA: Insufficient documentation

## 2020-11-09 DIAGNOSIS — M7989 Other specified soft tissue disorders: Secondary | ICD-10-CM

## 2020-11-09 DIAGNOSIS — Z992 Dependence on renal dialysis: Secondary | ICD-10-CM | POA: Insufficient documentation

## 2020-11-09 DIAGNOSIS — N185 Chronic kidney disease, stage 5: Secondary | ICD-10-CM | POA: Diagnosis not present

## 2020-11-09 DIAGNOSIS — Y92530 Ambulatory surgery center as the place of occurrence of the external cause: Secondary | ICD-10-CM | POA: Insufficient documentation

## 2020-11-09 HISTORY — PX: HEMATOMA EVACUATION: SHX5118

## 2020-11-09 HISTORY — PX: THROMBECTOMY W/ EMBOLECTOMY: SHX2507

## 2020-11-09 HISTORY — DX: Unspecified osteoarthritis, unspecified site: M19.90

## 2020-11-09 HISTORY — PX: AV FISTULA PLACEMENT: SHX1204

## 2020-11-09 LAB — CBC WITH DIFFERENTIAL/PLATELET
Abs Immature Granulocytes: 0.02 10*3/uL (ref 0.00–0.07)
Basophils Absolute: 0 10*3/uL (ref 0.0–0.1)
Basophils Relative: 0 %
Eosinophils Absolute: 0.3 10*3/uL (ref 0.0–0.5)
Eosinophils Relative: 5 %
HCT: 43.2 % (ref 39.0–52.0)
Hemoglobin: 13.4 g/dL (ref 13.0–17.0)
Immature Granulocytes: 0 %
Lymphocytes Relative: 28 %
Lymphs Abs: 1.8 10*3/uL (ref 0.7–4.0)
MCH: 30.5 pg (ref 26.0–34.0)
MCHC: 31 g/dL (ref 30.0–36.0)
MCV: 98.4 fL (ref 80.0–100.0)
Monocytes Absolute: 0.5 10*3/uL (ref 0.1–1.0)
Monocytes Relative: 8 %
Neutro Abs: 3.8 10*3/uL (ref 1.7–7.7)
Neutrophils Relative %: 59 %
Platelets: 179 10*3/uL (ref 150–400)
RBC: 4.39 MIL/uL (ref 4.22–5.81)
RDW: 14.6 % (ref 11.5–15.5)
WBC: 6.5 10*3/uL (ref 4.0–10.5)
nRBC: 0 % (ref 0.0–0.2)

## 2020-11-09 LAB — POCT I-STAT, CHEM 8
BUN: 26 mg/dL — ABNORMAL HIGH (ref 8–23)
Calcium, Ion: 1 mmol/L — ABNORMAL LOW (ref 1.15–1.40)
Chloride: 95 mmol/L — ABNORMAL LOW (ref 98–111)
Creatinine, Ser: 7.6 mg/dL — ABNORMAL HIGH (ref 0.61–1.24)
Glucose, Bld: 128 mg/dL — ABNORMAL HIGH (ref 70–99)
HCT: 48 % (ref 39.0–52.0)
Hemoglobin: 16.3 g/dL (ref 13.0–17.0)
Potassium: 3.5 mmol/L (ref 3.5–5.1)
Sodium: 137 mmol/L (ref 135–145)
TCO2: 30 mmol/L (ref 22–32)

## 2020-11-09 LAB — BASIC METABOLIC PANEL
Anion gap: 14 (ref 5–15)
BUN: 25 mg/dL — ABNORMAL HIGH (ref 8–23)
CO2: 27 mmol/L (ref 22–32)
Calcium: 8.2 mg/dL — ABNORMAL LOW (ref 8.9–10.3)
Chloride: 93 mmol/L — ABNORMAL LOW (ref 98–111)
Creatinine, Ser: 8.01 mg/dL — ABNORMAL HIGH (ref 0.61–1.24)
GFR, Estimated: 7 mL/min — ABNORMAL LOW (ref >60)
Glucose, Bld: 208 mg/dL — ABNORMAL HIGH (ref 70–99)
Potassium: 3.8 mmol/L (ref 3.5–5.1)
Sodium: 134 mmol/L — ABNORMAL LOW (ref 135–145)

## 2020-11-09 LAB — GLUCOSE, CAPILLARY
Glucose-Capillary: 146 mg/dL — ABNORMAL HIGH (ref 70–99)
Glucose-Capillary: 144 mg/dL — ABNORMAL HIGH (ref 70–99)
Glucose-Capillary: 145 mg/dL — ABNORMAL HIGH (ref 70–99)
Glucose-Capillary: 115 mg/dL — ABNORMAL HIGH (ref 70–99)

## 2020-11-09 SURGERY — ARTERIOVENOUS (AV) FISTULA CREATION
Anesthesia: Monitor Anesthesia Care | Site: Arm Upper | Laterality: Left

## 2020-11-09 SURGERY — EVACUATION HEMATOMA
Anesthesia: Monitor Anesthesia Care | Site: Arm Upper | Laterality: Left

## 2020-11-09 MED ORDER — LIDOCAINE-EPINEPHRINE (PF) 1 %-1:200000 IJ SOLN
INTRAMUSCULAR | Status: DC | PRN
  Administered 2020-11-09: 19 mL

## 2020-11-09 MED ORDER — CHLORHEXIDINE GLUCONATE 0.12 % MT SOLN: 15.0000 mL | Freq: Once | OROMUCOSAL | Status: AC

## 2020-11-09 MED ORDER — ALBUMIN HUMAN 5 % IV SOLN: INTRAVENOUS | Status: DC | PRN

## 2020-11-09 MED ORDER — EPHEDRINE 5 MG/ML INJ
INTRAVENOUS | Status: AC
  Filled 2020-11-09: qty 5

## 2020-11-09 MED ORDER — MORPHINE SULFATE (PF) 4 MG/ML IV SOLN
4.0000 mg | Freq: Once | INTRAVENOUS | Status: AC
Start: 2020-11-09 — End: 2020-11-09
  Administered 2020-11-09: 4 mg via INTRAVENOUS
  Filled 2020-11-09: qty 1

## 2020-11-09 MED ORDER — SODIUM CHLORIDE 0.9 % IR SOLN
Status: DC | PRN
  Administered 2020-11-09: 1000 mL

## 2020-11-09 MED ORDER — ACETAMINOPHEN 500 MG PO TABS: 1000.0000 mg | ORAL_TABLET | Freq: Once | ORAL | Status: DC | PRN

## 2020-11-09 MED ORDER — FENTANYL CITRATE (PF) 100 MCG/2ML IJ SOLN
INTRAMUSCULAR | Status: AC
  Filled 2020-11-09: qty 2

## 2020-11-09 MED ORDER — OXYCODONE HCL 5 MG PO TABS: 5.0000 mg | ORAL_TABLET | Freq: Once | ORAL | Status: DC | PRN

## 2020-11-09 MED ORDER — HEPARIN 6000 UNIT IRRIGATION SOLUTION
Status: AC
  Filled 2020-11-09: qty 500

## 2020-11-09 MED ORDER — MIDAZOLAM HCL 5 MG/5ML IJ SOLN
INTRAMUSCULAR | Status: DC | PRN
  Administered 2020-11-09 (×2): 1 mg via INTRAVENOUS

## 2020-11-09 MED ORDER — CEFAZOLIN SODIUM-DEXTROSE 2-4 GM/100ML-% IV SOLN
INTRAVENOUS | Status: AC
  Filled 2020-11-09: qty 100

## 2020-11-09 MED ORDER — ACETAMINOPHEN 10 MG/ML IV SOLN: 1000.0000 mg | Freq: Once | INTRAVENOUS | Status: DC | PRN

## 2020-11-09 MED ORDER — PROPOFOL 10 MG/ML IV BOLUS
INTRAVENOUS | Status: AC
  Filled 2020-11-09: qty 20

## 2020-11-09 MED ORDER — FENTANYL CITRATE (PF) 250 MCG/5ML IJ SOLN
INTRAMUSCULAR | Status: DC | PRN
  Administered 2020-11-09: 50 ug via INTRAVENOUS

## 2020-11-09 MED ORDER — ACETAMINOPHEN 160 MG/5ML PO SOLN: 1000.0000 mg | Freq: Once | ORAL | Status: DC | PRN

## 2020-11-09 MED ORDER — ONDANSETRON HCL 4 MG/2ML IJ SOLN
INTRAMUSCULAR | Status: AC
  Filled 2020-11-09: qty 6

## 2020-11-09 MED ORDER — CHLORHEXIDINE GLUCONATE 4 % EX LIQD: 60.0000 mL | Freq: Once | CUTANEOUS | Status: DC

## 2020-11-09 MED ORDER — PHENYLEPHRINE 40 MCG/ML (10ML) SYRINGE FOR IV PUSH (FOR BLOOD PRESSURE SUPPORT)
PREFILLED_SYRINGE | INTRAVENOUS | Status: DC | PRN
  Administered 2020-11-09: 80 ug via INTRAVENOUS

## 2020-11-09 MED ORDER — DEXAMETHASONE SODIUM PHOSPHATE 10 MG/ML IJ SOLN
INTRAMUSCULAR | Status: AC
  Filled 2020-11-09: qty 3

## 2020-11-09 MED ORDER — MIDAZOLAM HCL 2 MG/2ML IJ SOLN
INTRAMUSCULAR | Status: DC | PRN
  Administered 2020-11-09: 1 mg via INTRAVENOUS

## 2020-11-09 MED ORDER — OXYCODONE HCL 5 MG/5ML PO SOLN
5.0000 mg | Freq: Once | ORAL | Status: DC | PRN
Start: 2020-11-09 — End: 2020-11-09

## 2020-11-09 MED ORDER — ROCURONIUM BROMIDE 10 MG/ML (PF) SYRINGE
PREFILLED_SYRINGE | INTRAVENOUS | Status: AC
  Filled 2020-11-09: qty 10

## 2020-11-09 MED ORDER — FENTANYL CITRATE (PF) 100 MCG/2ML IJ SOLN
INTRAMUSCULAR | Status: DC | PRN
  Administered 2020-11-09 (×2): 25 ug via INTRAVENOUS
  Administered 2020-11-09: 50 ug via INTRAVENOUS

## 2020-11-09 MED ORDER — LIDOCAINE 2% (20 MG/ML) 5 ML SYRINGE
INTRAMUSCULAR | Status: AC
  Filled 2020-11-09: qty 5

## 2020-11-09 MED ORDER — LIDOCAINE-EPINEPHRINE (PF) 1 %-1:200000 IJ SOLN
INTRAMUSCULAR | Status: DC | PRN
  Administered 2020-11-09: 15 mL

## 2020-11-09 MED ORDER — CHLORHEXIDINE GLUCONATE 0.12 % MT SOLN: 15.0000 mL | OROMUCOSAL | Status: AC

## 2020-11-09 MED ORDER — CEFAZOLIN SODIUM-DEXTROSE 2-4 GM/100ML-% IV SOLN
2.0000 g | INTRAVENOUS | Status: AC
  Administered 2020-11-09: 2 g via INTRAVENOUS

## 2020-11-09 MED ORDER — ONDANSETRON HCL 4 MG/2ML IJ SOLN
INTRAMUSCULAR | Status: DC | PRN
  Administered 2020-11-09: 4 mg via INTRAVENOUS

## 2020-11-09 MED ORDER — OXYCODONE HCL 5 MG/5ML PO SOLN
5.0000 mg | Freq: Once | ORAL | Status: DC | PRN
Start: 2020-11-09 — End: 2020-11-10

## 2020-11-09 MED ORDER — FENTANYL CITRATE (PF) 100 MCG/2ML IJ SOLN: 25.0000 ug | INTRAMUSCULAR | Status: DC | PRN

## 2020-11-09 MED ORDER — ONDANSETRON HCL 4 MG/2ML IJ SOLN
INTRAMUSCULAR | Status: AC
  Filled 2020-11-09: qty 2

## 2020-11-09 MED ORDER — OXYCODONE-ACETAMINOPHEN 5-325 MG PO TABS: 1.0000 | ORAL_TABLET | ORAL | 0 refills | Status: DC | PRN

## 2020-11-09 MED ORDER — PHENYLEPHRINE HCL-NACL 20-0.9 MG/250ML-% IV SOLN
INTRAVENOUS | Status: AC
  Filled 2020-11-09: qty 250

## 2020-11-09 MED ORDER — LIDOCAINE-EPINEPHRINE (PF) 1 %-1:200000 IJ SOLN
INTRAMUSCULAR | Status: AC
  Filled 2020-11-09: qty 30

## 2020-11-09 MED ORDER — CHLORHEXIDINE GLUCONATE 0.12 % MT SOLN
OROMUCOSAL | Status: AC
  Administered 2020-11-09: 15 mL via OROMUCOSAL
  Filled 2020-11-09: qty 15

## 2020-11-09 MED ORDER — HEPARIN 6000 UNIT IRRIGATION SOLUTION
Status: DC | PRN
  Administered 2020-11-09: 1

## 2020-11-09 MED ORDER — DEXAMETHASONE SODIUM PHOSPHATE 10 MG/ML IJ SOLN
INTRAMUSCULAR | Status: AC
  Filled 2020-11-09: qty 1

## 2020-11-09 MED ORDER — PROTAMINE SULFATE 10 MG/ML IV SOLN
INTRAVENOUS | Status: DC | PRN
  Administered 2020-11-09: 30 mg via INTRAVENOUS

## 2020-11-09 MED ORDER — PROMETHAZINE HCL 25 MG/ML IJ SOLN: 6.2500 mg | INTRAMUSCULAR | Status: DC | PRN

## 2020-11-09 MED ORDER — HYDROMORPHONE HCL 1 MG/ML IJ SOLN: 0.2500 mg | INTRAMUSCULAR | Status: DC | PRN

## 2020-11-09 MED ORDER — DEXAMETHASONE SODIUM PHOSPHATE 10 MG/ML IJ SOLN
INTRAMUSCULAR | Status: DC | PRN
  Administered 2020-11-09: 4 mg via INTRAVENOUS

## 2020-11-09 MED ORDER — 0.9 % SODIUM CHLORIDE (POUR BTL) OPTIME
TOPICAL | Status: DC | PRN
  Administered 2020-11-09: 1000 mL

## 2020-11-09 MED ORDER — FENTANYL CITRATE (PF) 250 MCG/5ML IJ SOLN
INTRAMUSCULAR | Status: AC
  Filled 2020-11-09: qty 5

## 2020-11-09 MED ORDER — PHENYLEPHRINE 40 MCG/ML (10ML) SYRINGE FOR IV PUSH (FOR BLOOD PRESSURE SUPPORT)
PREFILLED_SYRINGE | INTRAVENOUS | Status: AC
  Filled 2020-11-09: qty 20

## 2020-11-09 MED ORDER — PROPOFOL 500 MG/50ML IV EMUL
INTRAVENOUS | Status: DC | PRN
  Administered 2020-11-09: 25 ug/kg/min via INTRAVENOUS

## 2020-11-09 MED ORDER — SODIUM CHLORIDE 0.9 % IV SOLN: INTRAVENOUS | Status: DC

## 2020-11-09 MED ORDER — ORAL CARE MOUTH RINSE: 15.0000 mL | Freq: Once | OROMUCOSAL | Status: AC

## 2020-11-09 MED ORDER — SODIUM CHLORIDE 0.9 % IV SOLN: INTRAVENOUS | Status: DC | PRN

## 2020-11-09 MED ORDER — MIDAZOLAM HCL 2 MG/2ML IJ SOLN
INTRAMUSCULAR | Status: AC
  Filled 2020-11-09: qty 2

## 2020-11-09 MED ORDER — PROPOFOL 1000 MG/100ML IV EMUL
INTRAVENOUS | Status: AC
  Filled 2020-11-09: qty 300

## 2020-11-09 MED ORDER — PROPOFOL 500 MG/50ML IV EMUL
INTRAVENOUS | Status: DC | PRN
  Administered 2020-11-09: 50 ug/kg/min via INTRAVENOUS

## 2020-11-09 MED ORDER — LIDOCAINE 2% (20 MG/ML) 5 ML SYRINGE
INTRAMUSCULAR | Status: AC
  Filled 2020-11-09: qty 15

## 2020-11-09 MED ORDER — LIDOCAINE HCL (PF) 1 % IJ SOLN
INTRAMUSCULAR | Status: AC
  Filled 2020-11-09: qty 30

## 2020-11-09 MED ORDER — HEPARIN SODIUM (PORCINE) 1000 UNIT/ML IJ SOLN
INTRAMUSCULAR | Status: DC | PRN
  Administered 2020-11-09: 6000 [IU] via INTRAVENOUS

## 2020-11-09 MED ORDER — EPHEDRINE SULFATE-NACL 50-0.9 MG/10ML-% IV SOSY
PREFILLED_SYRINGE | INTRAVENOUS | Status: DC | PRN
  Administered 2020-11-09: 5 mg via INTRAVENOUS

## 2020-11-09 MED ORDER — LACTATED RINGERS IV SOLN: INTRAVENOUS | Status: DC

## 2020-11-09 SURGICAL SUPPLY — 34 items
ADH SKN CLS APL DERMABOND .7 (GAUZE/BANDAGES/DRESSINGS) ×1
ARMBAND PINK RESTRICT EXTREMIT (MISCELLANEOUS) ×2 IMPLANT
BAG COUNTER SPONGE SURGICOUNT (BAG) ×2 IMPLANT
BAG SPNG CNTER NS LX DISP (BAG) ×1
CANISTER SUCT 3000ML PPV (MISCELLANEOUS) ×2 IMPLANT
CLIP LIGATING EXTRA MED SLVR (CLIP) ×2 IMPLANT
CLIP LIGATING EXTRA SM BLUE (MISCELLANEOUS) ×2 IMPLANT
CLIP VESOCCLUDE MED 6/CT (CLIP) ×1 IMPLANT
CLIP VESOCCLUDE SM WIDE 6/CT (CLIP) IMPLANT
COVER PROBE W GEL 5X96 (DRAPES) ×2 IMPLANT
DERMABOND ADVANCED (GAUZE/BANDAGES/DRESSINGS) ×1
DERMABOND ADVANCED .7 DNX12 (GAUZE/BANDAGES/DRESSINGS) ×1 IMPLANT
ELECT REM PT RETURN 9FT ADLT (ELECTROSURGICAL) ×2
ELECTRODE REM PT RTRN 9FT ADLT (ELECTROSURGICAL) ×1 IMPLANT
GAUZE 4X4 16PLY ~~LOC~~+RFID DBL (SPONGE) ×2 IMPLANT
GLOVE SURG ENC MOIS LTX SZ7.5 (GLOVE) ×2 IMPLANT
GOWN STRL REUS W/ TWL LRG LVL3 (GOWN DISPOSABLE) ×2 IMPLANT
GOWN STRL REUS W/ TWL XL LVL3 (GOWN DISPOSABLE) ×1 IMPLANT
GOWN STRL REUS W/TWL LRG LVL3 (GOWN DISPOSABLE) ×4
GOWN STRL REUS W/TWL XL LVL3 (GOWN DISPOSABLE) ×2
INSERT FOGARTY SM (MISCELLANEOUS) IMPLANT
KIT BASIN OR (CUSTOM PROCEDURE TRAY) ×2 IMPLANT
KIT TURNOVER KIT B (KITS) ×2 IMPLANT
NS IRRIG 1000ML POUR BTL (IV SOLUTION) ×2 IMPLANT
PACK CV ACCESS (CUSTOM PROCEDURE TRAY) ×2 IMPLANT
PAD ARMBOARD 7.5X6 YLW CONV (MISCELLANEOUS) ×4 IMPLANT
SPONGE T-LAP 18X18 ~~LOC~~+RFID (SPONGE) ×1 IMPLANT
SUT MNCRL AB 4-0 PS2 18 (SUTURE) ×2 IMPLANT
SUT PROLENE 6 0 BV (SUTURE) ×2 IMPLANT
SUT VIC AB 3-0 SH 27 (SUTURE) ×2
SUT VIC AB 3-0 SH 27X BRD (SUTURE) ×1 IMPLANT
TOWEL GREEN STERILE (TOWEL DISPOSABLE) ×2 IMPLANT
UNDERPAD 30X36 HEAVY ABSORB (UNDERPADS AND DIAPERS) ×2 IMPLANT
WATER STERILE IRR 1000ML POUR (IV SOLUTION) ×2 IMPLANT

## 2020-11-09 SURGICAL SUPPLY — 52 items
ADH SKN CLS APL DERMABOND .7 (GAUZE/BANDAGES/DRESSINGS) ×1
BAG COUNTER SPONGE SURGICOUNT (BAG) ×2 IMPLANT
BAG SPNG CNTER NS LX DISP (BAG) ×1
BANDAGE ESMARK 6X9 LF (GAUZE/BANDAGES/DRESSINGS) IMPLANT
BNDG CMPR 9X6 STRL LF SNTH (GAUZE/BANDAGES/DRESSINGS)
BNDG ELASTIC 4X5.8 VLCR STR LF (GAUZE/BANDAGES/DRESSINGS) ×2 IMPLANT
BNDG ESMARK 6X9 LF (GAUZE/BANDAGES/DRESSINGS)
CANISTER SUCT 3000ML PPV (MISCELLANEOUS) ×2 IMPLANT
CATH EMB 3FR 40CM (CATHETERS) ×2 IMPLANT
CLIP TI MEDIUM 6 (CLIP) ×2 IMPLANT
CUFF TOURN SGL QUICK 18X4 (TOURNIQUET CUFF) IMPLANT
CUFF TOURN SGL QUICK 24 (TOURNIQUET CUFF)
CUFF TOURN SGL QUICK 34 (TOURNIQUET CUFF)
CUFF TOURN SGL QUICK 42 (TOURNIQUET CUFF) IMPLANT
CUFF TRNQT CYL 24X4X16.5-23 (TOURNIQUET CUFF) IMPLANT
CUFF TRNQT CYL 34X4.125X (TOURNIQUET CUFF) IMPLANT
DERMABOND ADVANCED (GAUZE/BANDAGES/DRESSINGS) ×1
DERMABOND ADVANCED .7 DNX12 (GAUZE/BANDAGES/DRESSINGS) IMPLANT
DRAIN CHANNEL 15F RND FF W/TCR (WOUND CARE) IMPLANT
ELECT REM PT RETURN 9FT ADLT (ELECTROSURGICAL) ×2
ELECTRODE REM PT RTRN 9FT ADLT (ELECTROSURGICAL) ×1 IMPLANT
EVACUATOR SILICONE 100CC (DRAIN) IMPLANT
GAUZE 4X4 16PLY ~~LOC~~+RFID DBL (SPONGE) ×1 IMPLANT
GAUZE SPONGE 4X4 12PLY STRL (GAUZE/BANDAGES/DRESSINGS) ×2 IMPLANT
GLOVE SRG 8 PF TXTR STRL LF DI (GLOVE) ×1 IMPLANT
GLOVE SURG ENC MOIS LTX SZ7.5 (GLOVE) ×2 IMPLANT
GLOVE SURG MICRO LTX SZ6 (GLOVE) ×2 IMPLANT
GLOVE SURG MICRO LTX SZ6.5 (GLOVE) ×2 IMPLANT
GLOVE SURG UNDER POLY LF SZ6 (GLOVE) ×1 IMPLANT
GLOVE SURG UNDER POLY LF SZ8 (GLOVE) ×2
GLOVE SURG UNDER POLY LF SZ9 (GLOVE) ×1 IMPLANT
GOWN STRL REUS W/ TWL LRG LVL3 (GOWN DISPOSABLE) ×3 IMPLANT
GOWN STRL REUS W/TWL LRG LVL3 (GOWN DISPOSABLE) ×6
KIT BASIN OR (CUSTOM PROCEDURE TRAY) ×2 IMPLANT
KIT TURNOVER KIT B (KITS) ×2 IMPLANT
NS IRRIG 1000ML POUR BTL (IV SOLUTION) ×4 IMPLANT
PACK CV ACCESS (CUSTOM PROCEDURE TRAY) ×2 IMPLANT
PACK GENERAL/GYN (CUSTOM PROCEDURE TRAY) IMPLANT
PACK PERIPHERAL VASCULAR (CUSTOM PROCEDURE TRAY) IMPLANT
PAD ARMBOARD 7.5X6 YLW CONV (MISCELLANEOUS) ×4 IMPLANT
SPONGE T-LAP 18X18 ~~LOC~~+RFID (SPONGE) ×1 IMPLANT
STAPLER VISISTAT (STAPLE) IMPLANT
SUT MNCRL AB 4-0 PS2 18 (SUTURE) ×2 IMPLANT
SUT PROLENE 5 0 C 1 24 (SUTURE) ×1 IMPLANT
SUT PROLENE 6 0 BV (SUTURE) ×2 IMPLANT
SUT VIC AB 2-0 CTB1 (SUTURE) IMPLANT
SUT VIC AB 3-0 SH 27 (SUTURE) ×2
SUT VIC AB 3-0 SH 27X BRD (SUTURE) ×1 IMPLANT
TOWEL GREEN STERILE (TOWEL DISPOSABLE) ×2 IMPLANT
TRAY FOLEY MTR SLVR 16FR STAT (SET/KITS/TRAYS/PACK) IMPLANT
UNDERPAD 30X36 HEAVY ABSORB (UNDERPADS AND DIAPERS) ×2 IMPLANT
WATER STERILE IRR 1000ML POUR (IV SOLUTION) ×2 IMPLANT

## 2020-11-09 NOTE — Transfer of Care (Signed)
Immediate Anesthesia Transfer of Care Note  Patient: Jon Anderson  Procedure(s) Performed: LEFT ARM ARTERIOVENOUS (AV) FISTULA CREATION (Left: Arm Upper)  Patient Location: PACU  Anesthesia Type:MAC  Level of Consciousness: awake, alert , oriented and patient cooperative  Airway & Oxygen Therapy: Patient Spontanous Breathing  Post-op Assessment: Report given to RN, Post -op Vital signs reviewed and stable and Patient moving all extremities X 4  Post vital signs: Reviewed and stable  Last Vitals:  Vitals Value Taken Time  BP 147/80 11/09/20 0856  Temp    Pulse    Resp 19 11/09/20 0857  SpO2    Vitals shown include unvalidated device data.  Last Pain:  Vitals:   11/09/20 0998  TempSrc: Oral  PainSc:       Patients Stated Pain Goal: 0 (33/82/50 5397)  Complications: No notable events documented.

## 2020-11-09 NOTE — Op Note (Signed)
    NAME: Jon Anderson    MRN: [id number] DOB: 1950-11-15    DATE OF OPERATION: 11/09/2020  PREOP DIAGNOSIS:    Hematoma left upper arm  POSTOP DIAGNOSIS:    Seen and occluded left upper arm fistula  PROCEDURE:    Evacuation of hematoma left arm Thrombectomy of left brachial basilic fistula  SURGEON: Judeth Cornfield. Scot Dock, MD  ASSIST: None  ANESTHESIA: Local with sedation  EBL: Minimal  INDICATIONS:    [name] is a 69 y.o. male who had undergone a left for stage basilic vein transposition earlier today.  He was discharged to home but when he got home developed increasing swelling at the incision in the left arm.  His home nurse recommended he go to the emergency department.  On exam he had significant swelling at the incision.  He presents for evacuation of hematoma.  FINDINGS:   Moderate hematoma was evacuated.  The fistula was occluded so simple thrombectomy was performed and there was a good thrill at the completion.  TECHNIQUE:   The patient was taken to the operating room and sedated by anesthesia.  The left upper extremity was prepped and draped in usual sterile fashion.  The skin was anesthetized with 1% lidocaine.  The previous incision was opened.  A moderate hematoma was evacuated and the wound irrigated with copious amounts of saline.  There was no active bleeding.  The fistula however had occluded.  I gave 8000 units of heparin.  The brachial artery was clamped proximally distally and a transverse venotomy made in the fistula.  A #3 Fogarty catheter was passed proximally and distally and clot was retrieved.  Once no further clot was retrieved the vein was irrigated with heparinized saline.  The venotomy was closed with 3 interrupted 6-0 Prolene sutures.  At the completion was of thrill in the fistula.  There was a good signal in the radial artery and brachial artery distal to the anastomosis.  The heparin was partially reversed with protamine.  Hemostasis  was obtained in the wound.  The wound was closed with 2 deep layers of 3-0 Vicryl and the skin closed with 4-0 Vicryl.  Dermabond was applied.  The patient tolerated procedure well was transferred to the recovery room in stable condition.  All needle and sponge counts were correct.    Deitra Mayo, MD, FACS [facility] [place] [facility] DICTATION:   11/09/2020

## 2020-11-09 NOTE — Op Note (Signed)
    Patient name: KANON NOVOSEL MRN: [id number] DOB: March 22, 1951 Sex: male  11/09/2020 Pre-operative Diagnosis: End-stage renal disease Post-operative diagnosis:  Same Surgeon:  Erlene Quan C. Donzetta Matters, MD Assistant: Shea Evans, MS 3 Procedure Performed: Creation of left arm brachial artery to basilic vein AV fistula  Indications: 70 year old male with history of end-stage renal disease currently on dialysis via right IJ catheter.  Previously had a left brachial artery to cephalic vein fistula.  He does have suitable basilic vein for fistula creation we have discussed the risk benefits alternatives he agrees to proceed.  Findings: Basilic vein was compressible measured approximately 3-1/2 mm.  It was dilated to 4 mm.  There were multiple branches that were divided between clips and ties.  At completion there was a strong thrill in the fistula and a palpable radial artery pulse at the wrist.   Procedure:  The patient was identified in the holding area and taken to the operating was placed supine on upper table and MAC anesthesia was induced.  He was sterilely prepped draped in left upper extremity usual fashion, antibiotics were ministered a timeout was called.  We began using ultrasound we identified a suitable basilic vein to brachial artery.  There is no spasm percent lidocaine a transverse incision was made between the 2.  We first dissected out the vein dividing branches between clips and ties marked for orientation.  We then dissected through the deep fascia to the artery.  There was dense scar tissue given the previous fistula below the antecubitum.  We placed a vessel loop around the artery.  We then clamped the vein distally transected and tied it off.  We serially dilated to 4 mm flushed with heparinized saline spatulated and clamped it.  We clamped the artery distally proximally opened longitudinally with flushed only with heparinized saline distally given the size of the artery.  The vein was  then sewn into side with 6-0 Prolene suture.  Prior completion without flushing all directions.  Upon completion there was a very strong thrill in the vein.  There were multiple branches which were divided up under the skin incision with ties.  We confirmed flow with Doppler.  There was a palpable radial artery pulse the wrist.  Satisfied with this we irrigated the wound we obtain hemostasis we closed in layers of Vicryl Monocryl.  He was awakened from anesthesia having tolerated procedure without any complication.  All counts were correct at completion.  EBL: 20 cc   Cledith Kamiya C. Donzetta Matters, MD [facility] [place] Office: [phone] Pager: [phone]

## 2020-11-09 NOTE — Progress Notes (Signed)
VASCULAR SURGERY:  This patient had a left first stage basilic vein transposition earlier today.  He went home and developed significant swelling adjacent to the incision.  His home nurse instructed him to return to the emergency department.  Of note a pressure dressing was applied and he was complaining of tingling in the hand and was also noted to have diminished radial pulse.  On exam I took the dressing down.  He has a moderate hematoma adjacent to the incision.  Once the dressing was off he has a palpable radial pulse.  I have recommended evacuation of the hematoma in the operating room.  He ate lunch at 1130.  I have spoken to anesthesia and we will wait 6 hours.  I have discussed the indications for the procedure and potential complications with the patient and he is agreeable to proceed.  Gae Gallop, MD 3:03 PM

## 2020-11-09 NOTE — Transfer of Care (Signed)
Immediate Anesthesia Transfer of Care Note  Patient: Jon Anderson  Procedure(s) Performed: EVACUATION HEMATOMA LEFT ARM (Left: Arm Upper) THROMBECTOMY OF LEFT ARTERIOVENOUS FISTULA (Left: Arm Upper)  Patient Location: PACU  Anesthesia Type:MAC  Level of Consciousness: awake, alert  and oriented  Airway & Oxygen Therapy: Patient Spontanous Breathing  Post-op Assessment: Report given to RN and Post -op Vital signs reviewed and stable  Post vital signs: Reviewed and stable  Last Vitals:  Vitals Value Taken Time  BP 127/89 11/09/20 1852  Temp    Pulse 68 11/09/20 1853  Resp 16 11/09/20 1853  SpO2 73 % 11/09/20 1853  Vitals shown include unvalidated device data.  Last Pain:  Vitals:   11/09/20 1717  TempSrc: Oral  PainSc:       Patients Stated Pain Goal: 3 (71/83/67 2550)  Complications: No notable events documented.

## 2020-11-09 NOTE — Anesthesia Preprocedure Evaluation (Addendum)
Anesthesia Evaluation  Patient identified by MRN, date of birth, ID band Patient awake    Reviewed: Allergy & Precautions, NPO status , Patient's Chart, lab work & pertinent test results  History of Anesthesia Complications Negative for: history of anesthetic complications  Airway Mallampati: I  TM Distance: >3 FB Neck ROM: Full    Dental  (+) Upper Dentures, Lower Dentures   Pulmonary shortness of breath, sleep apnea , former smoker,    breath sounds clear to auscultation       Cardiovascular hypertension, + CAD, + Peripheral Vascular Disease, +CHF and + Orthopnea   Rhythm:Regular     Neuro/Psych  Neuromuscular disease    GI/Hepatic GERD  Medicated and Controlled,  Endo/Other  diabetes  Renal/GU ESRF and DialysisRenal diseaseLast hd Mon  Lab Results      Component                Value               Date                      CREATININE               7.60 (H)            11/09/2020           Lab Results      Component                Value               Date                      K                        3.5                 11/09/2020                Musculoskeletal  (+) Arthritis ,   Abdominal   Peds  Hematology  (+) Blood dyscrasia, anemia , Lab Results      Component                Value               Date                      WBC                      8.3                 12/01/2019                HGB                      16.3                11/09/2020                HCT                      48.0                11/09/2020                MCV                        92.9                12/01/2019                PLT                      329                 12/01/2019              Anesthesia Other Findings   Reproductive/Obstetrics                             Anesthesia Physical  Anesthesia Plan  ASA: 3  Anesthesia Plan: MAC   Post-op Pain Management:    Induction: Intravenous  PONV  Risk Score and Plan: 1 and Propofol infusion  Airway Management Planned: Nasal Cannula  Additional Equipment: None  Intra-op Plan:   Post-operative Plan:   Informed Consent: I have reviewed the patients History and Physical, chart, labs and discussed the procedure including the risks, benefits and alternatives for the proposed anesthesia with the patient or authorized representative who has indicated his/her understanding and acceptance.     Dental advisory given  Plan Discussed with: CRNA and Surgeon  Anesthesia Plan Comments:         Anesthesia Quick Evaluation  

## 2020-11-09 NOTE — ED Triage Notes (Signed)
Pt here after having a redo of a fistula of left arm  today ,pt was just discharge 2hrs ago , arm is swollen and has some slight drainage coming from surgical site

## 2020-11-09 NOTE — Anesthesia Postprocedure Evaluation (Signed)
Anesthesia Post Note  Patient: Jon Anderson  Procedure(s) Performed: EVACUATION HEMATOMA LEFT ARM (Left: Arm Upper) THROMBECTOMY OF LEFT ARTERIOVENOUS FISTULA (Left: Arm Upper)     Patient location during evaluation: PACU Anesthesia Type: MAC Level of consciousness: awake and alert Pain management: pain level controlled Vital Signs Assessment: post-procedure vital signs reviewed and stable Respiratory status: spontaneous breathing, nonlabored ventilation, respiratory function stable and patient connected to nasal cannula oxygen Cardiovascular status: stable and blood pressure returned to baseline Postop Assessment: no apparent nausea or vomiting Anesthetic complications: no   No notable events documented.  Last Vitals:  Vitals:   11/09/20 1852 11/09/20 1906  BP: 127/89 120/79  Pulse: (!) 52 78  Resp: 19 14  Temp: 36.4 C   SpO2: 96% 99%    Last Pain:  Vitals:   11/09/20 1906  TempSrc:   PainSc: 0-No pain                 Effie Berkshire

## 2020-11-09 NOTE — Anesthesia Postprocedure Evaluation (Signed)
Anesthesia Post Note  Patient: BREON REHM  Procedure(s) Performed: LEFT ARM ARTERIOVENOUS (AV) FISTULA CREATION (Left: Arm Upper)     Patient location during evaluation: PACU Anesthesia Type: MAC Level of consciousness: awake and patient cooperative Pain management: pain level controlled Vital Signs Assessment: post-procedure vital signs reviewed and stable Respiratory status: spontaneous breathing, nonlabored ventilation, respiratory function stable and patient connected to nasal cannula oxygen Cardiovascular status: stable and blood pressure returned to baseline Postop Assessment: no apparent nausea or vomiting Anesthetic complications: no   No notable events documented.  Last Vitals:  Vitals:   11/09/20 0910 11/09/20 0925  BP: 131/74 139/85  Pulse: 88 89  Resp: 16 18  Temp:  (!) 36.1 C  SpO2: 100% 100%    Last Pain:  Vitals:   11/09/20 0925  TempSrc:   PainSc: Asleep                 Madonna Flegal

## 2020-11-09 NOTE — ED Provider Notes (Signed)
[facility] EMERGENCY DEPARTMENT Provider Note   CSN: [id number] Arrival date & time: 11/09/20  1202     History Chief Complaint  Patient presents with   Arm Swelling   Post-op Problem    Jon Anderson is a 70 y.o. male.  The history is provided by the patient and medical records. No language interpreter was used.   70 year old male with significant history end-stage renal disease currently on dialysis via right IJ catheter, who had a creation of a left arm brachial artery to basilic vein AV fistula performed by Dr. Donzetta Matters earlier today presenting complaining of arm swelling.  Patient states he was discharged approximately 3 hours ago when he traveled home and remove the dressing he noticed blood oozing out from the wound as well as moderate swelling.  He endorsed increasing pain to the affected area which concerns him.  No chest pain or shortness of breath.  Past Medical History:  Diagnosis Date   Anemia of chronic disease    Arthritis    CAD (coronary artery disease)    a.  Myoview 4/11: EF 53%, no scar or ischemia   c. MV 2012 Nl perfusion, apical thinning.  No ischemia or scar.  EF 49%, appears greater by visual estimate.;  d.  Dob stress echo 12/13:  Negative Dob stress echo. There is no evidence of ischemia.  The LVF is normal. b. Normal cors 2016.   Carotid stenosis    a. <46% RICA, >56% LICA by duplex 11/1273   Chronic chest pain    occ   Constipation    chronic   Dyspnea    occasional with extertion   ESRD (end stage renal disease) on dialysis ([place])    M-W-F- [name]   GERD (gastroesophageal reflux disease)    History of kidney stones    Passed   HNP (herniated nucleus pulposus), lumbar    HTN (hypertension)    echo 3/10: EF 60%, LAE   Hyperlipidemia    Nephrolithiasis    "passed them all"   Peripheral arterial disease ([place])    a. s/p PTCA  right    Pneumonia yrs ago   Restless legs    Sciatic leg pain    Sleep apnea    does not use  cpap   Snores    a. presumed OSA, pt has refused sleep eval in past.   Type II diabetes mellitus ([place])    no longer on medications, checks blood glucose at home   Urinary frequency    Uses wheelchair    Walker as ambulation aid     Patient Active Problem List   Diagnosis Date Noted   Right below-knee amputee ([place]) 11/25/2019   S/P transmetatarsal amputation of foot, right ([place]) 11/25/2019   Gangrene of right foot ([place]) 11/19/2019   History of partial ray amputation of fifth toe of right foot ([place]) 09/09/2019   Ischemic ulcer diabetic foot ([place]) 09/09/2019   Subacute osteomyelitis, right ankle and foot ([place])    Diabetic foot infection ([place]) 07/24/2019   Chronic diastolic CHF (congestive heart failure) ([place]) 07/24/2019   Diabetic ulcer of right midfoot associated with diabetes mellitus due to underlying condition, with fat layer exposed ([place])    Gastroenteritis 04/24/2019   Allergy, unspecified, initial encounter 01/27/2019   DM neuropathy with neurologic complication ([place]) 17/00/1749   GERD (gastroesophageal reflux disease) 02/28/2018   Nephrolithiasis 02/28/2018   Sciatic leg pain 02/28/2018   Snores 02/28/2018   Orthopnea 02/23/2018  Elevated troponin 02/23/2018   HNP (herniated nucleus pulposus), lumbar 07/03/2017   Encounter for removal of sutures 06/01/2017   Chest pain in adult 04/27/2017   Restless leg syndrome, uncontrolled 04/27/2017   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/[facility] 03/17/2017   Anemia of chronic disease 03/17/2017   ESRD (end stage renal disease) on dialysis ([place]) 03/17/2017   Chronic chest pain 03/17/2017   Type II diabetes mellitus ([place]) 03/17/2017   Acute on chronic diastolic heart failure ([place]) 03/17/2017   Acute heart failure ([place]) 03/17/2017   Lumbar radiculopathy 01/16/2017   Pre-transplant evaluation for kidney transplant 06/15/2016   Pain, unspecified 04/17/2016   Increased frequency of urination 12/17/2015   Nocturia 12/17/2015   Chest pain, non-cardiac  10/09/2015   Hypercalcemia 05/10/2015   Other fluid overload 02/24/2015   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[place]) 10/03/2014   Fatty (change of) liver, not elsewhere classified 07/02/2014   Aftercare including intermittent dialysis ([place]) 06/24/2014   Diarrhea, unspecified 06/24/2014   Fever, unspecified 06/24/2014   Iron deficiency anemia, unspecified 06/24/2014   Other specified coagulation defects ([place]) 06/24/2014   Pruritus, unspecified 06/24/2014   Secondary hyperparathyroidism of renal origin ([place]) 06/24/2014   Type 2 diabetes mellitus with diabetic peripheral angiopathy without gangrene ([place]) 06/24/2014   Acute on chronic renal failure ([place]) 06/16/2014   Shoulder pain, left 12/15/2013   Chest pain 12/15/2013   Claudication ([place]) 12/11/2013   PVD (peripheral vascular disease) ([place]) 12/11/2013   Carotid artery disease ([place]) 09/30/2013   Acute chest pain 11/15/2012   Bruit 09/15/2010   CAD (coronary artery disease) nonobstructive per cath 2012    Hyperlipidemia LDL goal <70 07/22/2009   Essential hypertension 07/22/2009    Past Surgical History:  Procedure Laterality Date   ABDOMINAL AORTOGRAM W/LOWER EXTREMITY Bilateral 08/21/2019   Procedure: ABDOMINAL AORTOGRAM W/LOWER EXTREMITY;  Surgeon: Waynetta Sandy, MD;  Location: [place] [facility];  Service: Cardiovascular;  Laterality: Bilateral;   AMPUTATION Right 07/25/2019   Procedure: RIGHT 5th RAY AMPUTATION;  Surgeon: Newt Minion, MD;  Location: [place];  Service: Orthopedics;  Laterality: Right;   AMPUTATION Right 11/19/2019   Procedure: RIGHT TRANSMETATARSAL AMPUTATION;  Surgeon: Newt Minion, MD;  Location: [place];  Service: Orthopedics;  Laterality: Right;   ANGIOPLASTY / STENTING FEMORAL Left 12/11/2013   dr berry   AV FISTULA PLACEMENT Left 03/19/2014   Procedure: CREATION OF ARTERIOVENOUS (AV) FISTULA  LEFT UPPER ARM;  Surgeon: Mal Misty, MD;  Location: [place];  Service:  Vascular;  Laterality: Left;   BACK SURGERY  01/2018   screws placed    CARDIAC CATHETERIZATION  2001 and 2010    COLONOSCOPY W/ BIOPSIES AND POLYPECTOMY     COLONOSCOPY WITH PROPOFOL N/A 08/01/2016   Procedure: COLONOSCOPY WITH PROPOFOL;  Surgeon: Carol Ada, MD;  Location: [facility];  Service: Endoscopy;  Laterality: N/A;   CORONARY ARTERY BYPASS GRAFT  2019   [facility] x [facility] bypass   ESOPHAGOGASTRODUODENOSCOPY (EGD) WITH PROPOFOL N/A 08/01/2016   Procedure: ESOPHAGOGASTRODUODENOSCOPY (EGD) WITH PROPOFOL;  Surgeon: Carol Ada, MD;  Location: [facility];  Service: Endoscopy;  Laterality: N/A;   FISTULA SUPERFICIALIZATION Left 02/10/2020   Procedure: 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;  Surgeon: Rosetta Posner, MD;  Location: [facility];  Service: Vascular;  Laterality: Left;   FISTULA SUPERFICIALIZATION Left 03/23/2020   Procedure: LEFT UPPER EXTREMITY ARTERIOVENOUS FISTULA PLICATION;  Surgeon: Rosetta Posner, MD;  Location: [place];  Service: Vascular;  Laterality: Left;   FOOT FRACTURE SURGERY Right    ligament repair   FRACTURE SURGERY     left forearm   GRAFT APPLICATION Right 05/14/5359   Procedure: FAT GRAFT APPLICATION;  Surgeon: Evelina Bucy, DPM;  Location: [facility];  Service: Podiatry;  Laterality: Right;   INGUINAL HERNIA REPAIR Left    LEFT HEART CATH AND CORS/GRAFTS ANGIOGRAPHY N/A 04/27/2017   Procedure: LEFT HEART CATH AND CORS/GRAFTS ANGIOGRAPHY;  Surgeon: Leonie Man, MD;  Location: [facility];  Service: Cardiovascular;  Laterality: N/A;   LEFT HEART CATHETERIZATION WITH CORONARY ANGIOGRAM N/A 06/22/2014   Procedure: LEFT HEART CATHETERIZATION WITH CORONARY ANGIOGRAM;  Surgeon: Troy Sine, MD;  Location[facility];  Service: Cardiovascular;  Laterality: N/A;   LOWER EXTREMITY ANGIOGRAM Left 12/11/2013   Procedure: LOWER EXTREMITY ANGIOGRAM;  Surgeon: Lorretta Harp, MD;  Location: [facility] LAB;  Service: Cardiovascular;   Laterality: Left;   LUMBAR LAMINECTOMY/DECOMPRESSION MICRODISCECTOMY Right 07/03/2017   Procedure: MICRODISCECTOMY LUMBAR FIVE - SACRAL ONE RIGHT;  Surgeon: Consuella Lose, MD;  Location: [facility];  Service: Neurosurgery;  Laterality: Right;   LUMBAR LAMINECTOMY/DECOMPRESSION MICRODISCECTOMY Right 10/19/2017   Procedure: MICRODISCECTOMY LUMBAR FIVE- SACRAL 1 ONE ;  Surgeon: Consuella Lose, MD;  Location: [place];  Service: Neurosurgery;  Laterality: Right;   TONSILLECTOMY AND ADENOIDECTOMY     WISDOM TOOTH EXTRACTION     WOUND DEBRIDEMENT Right 05/13/2019   Procedure: DEBRIDEMENT WOUND;  Surgeon: Evelina Bucy, DPM;  Location: [facility];  Service: Podiatry;  Laterality: Right;       Family History  Problem Relation Age of Onset   Heart attack Sister        died @ 40   Cancer Mother        died @ 30; unknown type   Diabetes Brother        deceased   Cirrhosis Father        alcohol related   Diabetes Father    Esophageal cancer Neg Hx    Colon cancer Neg Hx    Pancreatic cancer Neg Hx    Stomach cancer Neg Hx     Social History   Tobacco Use   Smoking status: Former    Packs/day: 1.00    Years: 2.00    Pack years: 2.00    Types: Cigarettes    Quit date: 1978    Years since quitting: 44.6   Smokeless tobacco: Never  Vaping Use   Vaping Use: Never used  Substance Use Topics   Alcohol use: Not Currently    Alcohol/week: 0.0 standard drinks    Comment: h/o social drinking   Drug use: Not Currently    Types: Marijuana    Comment: quit 1986    Home Medications Prior to Admission medications   Medication Sig Start Date End Date Taking? Authorizing Provider  aspirin EC 81 MG tablet Take 1 tablet (81 mg total) by mouth daily. 05/28/12   Richardson Dopp T, PA-C  atorvastatin (LIPITOR) 40 MG tablet Take 1 tablet (40 mg total) by mouth daily at 6 PM. 12/01/19   Angiulli, Lavon Paganini, PA-C  calcitRIOL (ROCALTROL) 0.25 MCG capsule Take 0.25 mcg by mouth every  Monday, Wednesday, and Friday. 12/01/19   [provider]  calcium acetate (PHOSLO) 667 MG capsule Take 2 capsules (1,334 mg total) by mouth 2 (two) times daily with a meal. 12/01/19   Angiulli, Lavon Paganini, PA-C  clopidogrel (PLAVIX) 75  MG tablet Take 75 mg by mouth daily. 12/01/19   [provider]  gabapentin (NEURONTIN) 100 MG capsule Take 100-200 mg by mouth See admin instructions. 100 mg in the morning, 200 mg in the evening 11/28/19   [provider]  loperamide (IMODIUM) 2 MG capsule Take 2 mg by mouth as needed for diarrhea or loose stools.    [provider]  midodrine (PROAMATINE) 5 MG tablet Take 5 mg by mouth in the morning and at bedtime. 12/01/19   [provider]  multivitamin (RENA-VIT) TABS tablet Take 1 tablet by mouth at bedtime. 12/01/19   Angiulli, Lavon Paganini, PA-C  Naldemedine Tosylate (SYMPROIC) 0.2 MG TABS Take 0.2 mg by mouth daily as needed (constipation). 12/10/19   [provider]  oxyCODONE-acetaminophen (PERCOCET) 5-325 MG tablet Take 1-2 tablets by mouth every 4 (four) hours as needed for severe pain. 11/09/20   Waynetta Sandy, MD  pantoprazole (PROTONIX) 40 MG tablet Take 40 mg by mouth daily.    [provider]  pentoxifylline (TRENTAL) 400 MG CR tablet Take 1 tablet (400 mg total) by mouth daily. Patient not taking: No sig reported 12/05/19   Persons, Bevely Palmer, PA  polyethylene glycol (MIRALAX / GLYCOLAX) packet Take 17 g by mouth daily as needed for moderate constipation. 03/22/17   Aline August, MD  traMADol (ULTRAM) 50 MG tablet Take 50 mg by mouth every 6 (six) hours as needed for severe pain. 12/17/19   [provider]    Allergies    Kiwi extract, Flexeril [cyclobenzaprine], and Tape  Review of Systems   Review of Systems  All other systems reviewed and are negative.  Physical Exam Updated Vital Signs BP (!) 92/56   Pulse (!) 105   Temp 98.2 F (36.8 C) (Oral)   Resp 14   SpO2  100%   Physical Exam Vitals and nursing note reviewed.  Constitutional:      General: He is not in acute distress.    Appearance: He is well-developed.  HENT:     Head: Atraumatic.  Eyes:     Conjunctiva/sclera: Conjunctivae normal.  Cardiovascular:     Rate and Rhythm: Normal rate and regular rhythm.     Pulses: Normal pulses.     Heart sounds: Normal heart sounds.  Musculoskeletal:        General: Tenderness (Left arm: Developing hematoma to the surgical site at the left AV fistula.  Diminished thrill.  Diminished radial pulse.  Forearm compartments soft.  Sensation is intact.  Small amount of blood is oozing from the surgical site) present.     Cervical back: Neck supple.  Skin:    Findings: No rash.  Neurological:     Mental Status: He is alert.    ED Results / Procedures / Treatments   Labs (all labs ordered are listed, but only abnormal results are displayed) Labs Reviewed  CBC WITH DIFFERENTIAL/PLATELET  BASIC METABOLIC PANEL    EKG None  Radiology No results found.  Procedures Procedures   Medications Ordered in ED Medications - No data to display  ED Course  I have reviewed the triage vital signs and the nursing notes.  Pertinent labs & imaging results that were available during my care of the patient were reviewed by me and considered in my medical decision making (see chart for details).    MDM Rules/Calculators/A&P  BP (!) 92/56   Pulse (!) 105   Temp 98.2 F (36.8 C) (Oral)   Resp 14   SpO2 100%   Final Clinical Impression(s) / ED Diagnoses Final diagnoses:  Left arm swelling  Postoperative hematoma involving circulatory system following other circulatory system procedure    Rx / DC Orders ED Discharge Orders     None      1:36 PM Patient recently had a left arm brachial artery to basilic vein AV fistula placement done by Dr. Donzetta Matters earlier today who is here with complaints of arm swelling.  He does have a  moderate size hematoma at the surgical site along with diminished radial pulse and diminished thrill to the fistula.  No obvious compartment syndrome but I will consult vascular surgery promptly for assessment.  1:43 PM I have probably consulted vascular surgeon Dr. Scot Dock who is currently operating.  He will see patient soon.  Recommend NPO.  Patient's last meal was approximately 2 hours ago.  3:05 PM Pt sign out to oncoming provider who will f/u on vascular surgeon's recommendation for dispo once pt seen by vascular surgeon.     Domenic Moras, PA-C 11/09/20 1506    Charlesetta Shanks, MD 11/09/20 [phone]

## 2020-11-09 NOTE — Anesthesia Preprocedure Evaluation (Addendum)
Anesthesia Evaluation  Patient identified by MRN, date of birth, ID band Patient awake    Reviewed: Allergy & Precautions, NPO status , Patient's Chart, lab work & pertinent test results  History of Anesthesia Complications Negative for: history of anesthetic complications  Airway Mallampati: I  TM Distance: >3 FB Neck ROM: Full    Dental  (+) Upper Dentures, Lower Dentures   Pulmonary shortness of breath, sleep apnea , former smoker,    breath sounds clear to auscultation       Cardiovascular hypertension, + CAD, + Peripheral Vascular Disease, +CHF and + Orthopnea   Rhythm:Regular     Neuro/Psych  Neuromuscular disease    GI/Hepatic GERD  Medicated and Controlled,  Endo/Other  diabetes  Renal/GU ESRF and DialysisRenal diseaseLast hd Mon  Lab Results      Component                Value               Date                      CREATININE               7.60 (H)            11/09/2020           Lab Results      Component                Value               Date                      K                        3.5                 11/09/2020                Musculoskeletal  (+) Arthritis ,   Abdominal   Peds  Hematology  (+) Blood dyscrasia, anemia , Lab Results      Component                Value               Date                      WBC                      8.3                 12/01/2019                HGB                      16.3                11/09/2020                HCT                      48.0                11/09/2020                MCV  92.9                12/01/2019                PLT                      329                 12/01/2019              Anesthesia Other Findings   Reproductive/Obstetrics                             Anesthesia Physical  Anesthesia Plan  ASA: 3  Anesthesia Plan: MAC   Post-op Pain Management:    Induction: Intravenous  PONV  Risk Score and Plan: 1 and Propofol infusion  Airway Management Planned: Nasal Cannula  Additional Equipment: None  Intra-op Plan:   Post-operative Plan:   Informed Consent: I have reviewed the patients History and Physical, chart, labs and discussed the procedure including the risks, benefits and alternatives for the proposed anesthesia with the patient or authorized representative who has indicated his/her understanding and acceptance.     Dental advisory given  Plan Discussed with: CRNA and Surgeon  Anesthesia Plan Comments:         Anesthesia Quick Evaluation

## 2020-11-09 NOTE — Anesthesia Procedure Notes (Signed)
Procedure Name: MAC Date/Time: 11/09/2020 7:44 AM Performed by: Annamary Carolin, CRNA Pre-anesthesia Checklist: Patient identified, Emergency Drugs available, Suction available and Patient being monitored Patient Re-evaluated:Patient Re-evaluated prior to induction Preoxygenation: Pre-oxygenation with 100% oxygen Induction Type: IV induction

## 2020-11-09 NOTE — ED Provider Notes (Signed)
Emergency Medicine Provider Triage Evaluation Note  Jon Anderson , a 70 y.o. male  was evaluated in triage.  Pt complains of left arm swelling. Had a vascular procedure earlier today and now has increased swelling and pain to the lue. Reports pain to this area.  Review of Systems  Positive: Left arm swelling Negative: numbness  Physical Exam  BP (!) 92/56   Pulse (!) 105   Temp 98.2 F (36.8 C) (Oral)   Resp 14   SpO2 100%  Gen:   Awake, no distress   Resp:  Normal effort  MSK:   Moves extremities without difficulty  Other:  Large hematoma to the lue that is ttp  Medical Decision Making  Medically screening exam initiated at 12:45 PM.  Appropriate orders placed.  Jon Anderson was informed that the remainder of the evaluation will be completed by another provider, this initial triage assessment does not replace that evaluation, and the importance of remaining in the ED until their evaluation is complete.     [name] 11/09/20 1246    Wyvonnia Dusky, MD 11/09/20 [phone]

## 2020-11-09 NOTE — Interval H&P Note (Signed)
History and Physical Interval Note:  11/09/2020 6:27 AM  Jon Anderson  has presented today for surgery, with the diagnosis of ESRD.  The various methods of treatment have been discussed with the patient and family. After consideration of risks, benefits and other options for treatment, the patient has consented to  Procedure(s): LEFT ARM ARTERIOVENOUS (AV) FISTULA CREATION VERSUS GRAFT PLACEMENT (Left) as a surgical intervention.  The patient's history has been reviewed, patient examined, no change in status, stable for surgery.  I have reviewed the patient's chart and labs.  Questions were answered to the patient's satisfaction.     [name]

## 2020-11-10 ENCOUNTER — Encounter (HOSPITAL_COMMUNITY): Payer: Self-pay | Admitting: Vascular Surgery

## 2020-11-16 ENCOUNTER — Telehealth: Payer: Self-pay

## 2020-11-16 NOTE — Telephone Encounter (Signed)
Pt called with c/o LUE bruising and swelling since surgery. He has not been elevating it often. I have advised him to prop his arm up and elevate it today and see if that improved the swelling and discomfort. He verbalized understanding and will call back if symptoms do not improve or worsen.

## 2020-11-19 ENCOUNTER — Encounter (HOSPITAL_COMMUNITY): Payer: Self-pay | Admitting: *Deleted

## 2020-11-22 ENCOUNTER — Other Ambulatory Visit: Payer: Self-pay | Admitting: Physician Assistant

## 2020-11-22 ENCOUNTER — Telehealth: Payer: Self-pay

## 2020-11-22 MED ORDER — OXYCODONE-ACETAMINOPHEN 5-325 MG PO TABS: 1.0000 | ORAL_TABLET | Freq: Four times a day (QID) | ORAL | 0 refills | Status: DC | PRN

## 2020-11-22 NOTE — Telephone Encounter (Signed)
Patient calls today to report some continued pain in his arm s/p AVF and hematoma evacuation. Says the arm is swollen at the hand and wrist. Can move his fingers, denies coldness - there is bruising. Hurts mostly at the incision site, denies redness, drainage, chills or fever. Called in refill of pain medicine, instructed to call back if condition does not improve.

## 2020-12-01 ENCOUNTER — Other Ambulatory Visit: Payer: Self-pay

## 2020-12-01 ENCOUNTER — Telehealth: Payer: Self-pay

## 2020-12-01 DIAGNOSIS — N186 End stage renal disease: Secondary | ICD-10-CM

## 2020-12-01 NOTE — Telephone Encounter (Signed)
Received a call from Caren Griffins at [facility] - APP assessed patient and found decreased bruit and thrill. Patient also c./o pain in left arm s/p fistula & subsequent evacuation of hematoma on 8/2. Moved up appt and informed patient.

## 2020-12-03 ENCOUNTER — Ambulatory Visit (INDEPENDENT_AMBULATORY_CARE_PROVIDER_SITE_OTHER): Payer: Medicare Other | Admitting: Physician Assistant

## 2020-12-03 ENCOUNTER — Other Ambulatory Visit: Payer: Self-pay

## 2020-12-03 ENCOUNTER — Ambulatory Visit (HOSPITAL_COMMUNITY)
Admission: RE | Admit: 2020-12-03 | Discharge: 2020-12-03 | Disposition: A | Discharge status: Home / Self Care | Payer: Medicare Other | Source: Ambulatory Visit | Attending: Vascular Surgery | Admitting: Vascular Surgery

## 2020-12-03 VITALS — BP 81/54 | HR 79 | Temp 97.2°F | Resp 20 | Ht 71.0 in | Wt 166.4 lb

## 2020-12-03 DIAGNOSIS — N186 End stage renal disease: Secondary | ICD-10-CM

## 2020-12-03 DIAGNOSIS — Z992 Dependence on renal dialysis: Secondary | ICD-10-CM

## 2020-12-03 NOTE — H&P (View-Only) (Signed)
POST OPERATIVE OFFICE NOTE    CC:  F/u for surgery  HPI:  This is a 70 y.o. male who is s/p left UE basilic vein fistula creation on 11/09/20 by Dr. Donzetta Matters.  He developed a hematoma and returned to the OR with Dr. Scot Dock for evacuation of hematoma.    Pt returns today for follow up.  Pt states the swelling has gone down in his arm since the surgery.  He denise loss of motor, loss of sensation or pain in his hand with tissue loss.   Allergies  Allergen Reactions   Kiwi Extract Itching, Swelling and Other (See Comments)    Lips and face swell- breathing not affected   Flexeril [Cyclobenzaprine]     Hands become flimsy, can not hold things   Tape Other (See Comments)    "Plastic" tape causes blisters!!    Current Outpatient Medications  Medication Sig Dispense Refill   aspirin EC 81 MG tablet Take 1 tablet (81 mg total) by mouth daily.     atorvastatin (LIPITOR) 40 MG tablet Take 1 tablet (40 mg total) by mouth daily at 6 PM. 30 tablet 5   calcitRIOL (ROCALTROL) 0.25 MCG capsule Take 0.25 mcg by mouth every Monday, Wednesday, and Friday.     calcium acetate (PHOSLO) 667 MG capsule Take 2 capsules (1,334 mg total) by mouth 2 (two) times daily with a meal. 180 capsule 0   clopidogrel (PLAVIX) 75 MG tablet Take 75 mg by mouth daily.     gabapentin (NEURONTIN) 100 MG capsule Take 100-200 mg by mouth See admin instructions. 100 mg in the morning, 200 mg in the evening     loperamide (IMODIUM) 2 MG capsule Take 2 mg by mouth as needed for diarrhea or loose stools.     midodrine (PROAMATINE) 5 MG tablet Take 5 mg by mouth in the morning and at bedtime.     multivitamin (RENA-VIT) TABS tablet Take 1 tablet by mouth at bedtime. 30 tablet 0   oxyCODONE-acetaminophen (PERCOCET) 5-325 MG tablet Take 1 tablet by mouth every 6 (six) hours as needed for moderate pain. 20 tablet 0   pantoprazole (PROTONIX) 40 MG tablet Take 40 mg by mouth daily.     pentoxifylline (TRENTAL) 400 MG CR tablet Take 1  tablet (400 mg total) by mouth daily. 90 tablet 3   polyethylene glycol (MIRALAX / GLYCOLAX) packet Take 17 g by mouth daily as needed for moderate constipation. 14 each 0   No current facility-administered medications for this visit.     ROS:  See HPI  Physical Exam:    Incision:  Well healed Extremities:  palpable radial pulse, palpable thrill, grip equal B UE     Findings:  +--------------------+----------+-----------------+--------+  AVF                 PSV (cm/s)Flow Vol (mL/min)Comments  +--------------------+----------+-----------------+--------+  Native artery inflow   134           351                 +--------------------+----------+-----------------+--------+  AVF Anastomosis        187                               +--------------------+----------+-----------------+--------+      +------------+----------+-------------+----------+-------------------+  OUTFLOW VEINPSV (cm/s)Diameter (cm)Depth (cm)     Describe        +------------+----------+-------------+----------+-------------------+  Prox UA  49        0.53        1.02                        +------------+----------+-------------+----------+-------------------+  Mid UA          89        0.57        0.89                        +------------+----------+-------------+----------+-------------------+  Dist UA        348        0.45        0.98                        +------------+----------+-------------+----------+-------------------+  AC Fossa       656        0.12        1.30   residual lumen <40mm  +------------+----------+-------------+----------+-------------------+     Summary:  Arteriovenous fistula-Residual lumen of less than 5mm noted at the level  of the  antecubital fossa, approximately 3.0 cm from the anastomosis.  Velocities <100cm/s observed in the proximal and mid upper arm segments of  the basilic outflow vein.    Assessment/Plan:  This is a  70 y.o. male who is s/p: The duplex shows that the fistula is maturing well.  He will be scheduled for second stage basilic transposition in the near future.  I gave him a squeeze ball to exercise his left hand with.  He has HD MWF.     Roxy Horseman PA-C [facility] [phone]   Clinic MD:  Donzetta Matters

## 2020-12-03 NOTE — Progress Notes (Signed)
POST OPERATIVE OFFICE NOTE    CC:  F/u for surgery  HPI:  This is a 70 y.o. male who is s/p left UE basilic vein fistula creation on 11/09/20 by Dr. Donzetta Matters.  He developed a hematoma and returned to the OR with Dr. Scot Dock for evacuation of hematoma.    Pt returns today for follow up.  Pt states the swelling has gone down in his arm since the surgery.  He denise loss of motor, loss of sensation or pain in his hand with tissue loss.   Allergies  Allergen Reactions   Kiwi Extract Itching, Swelling and Other (See Comments)    Lips and face swell- breathing not affected   Flexeril [Cyclobenzaprine]     Hands become flimsy, can not hold things   Tape Other (See Comments)    "Plastic" tape causes blisters!!    Current Outpatient Medications  Medication Sig Dispense Refill   aspirin EC 81 MG tablet Take 1 tablet (81 mg total) by mouth daily.     atorvastatin (LIPITOR) 40 MG tablet Take 1 tablet (40 mg total) by mouth daily at 6 PM. 30 tablet 5   calcitRIOL (ROCALTROL) 0.25 MCG capsule Take 0.25 mcg by mouth every Monday, Wednesday, and Friday.     calcium acetate (PHOSLO) 667 MG capsule Take 2 capsules (1,334 mg total) by mouth 2 (two) times daily with a meal. 180 capsule 0   clopidogrel (PLAVIX) 75 MG tablet Take 75 mg by mouth daily.     gabapentin (NEURONTIN) 100 MG capsule Take 100-200 mg by mouth See admin instructions. 100 mg in the morning, 200 mg in the evening     loperamide (IMODIUM) 2 MG capsule Take 2 mg by mouth as needed for diarrhea or loose stools.     midodrine (PROAMATINE) 5 MG tablet Take 5 mg by mouth in the morning and at bedtime.     multivitamin (RENA-VIT) TABS tablet Take 1 tablet by mouth at bedtime. 30 tablet 0   oxyCODONE-acetaminophen (PERCOCET) 5-325 MG tablet Take 1 tablet by mouth every 6 (six) hours as needed for moderate pain. 20 tablet 0   pantoprazole (PROTONIX) 40 MG tablet Take 40 mg by mouth daily.     pentoxifylline (TRENTAL) 400 MG CR tablet Take 1  tablet (400 mg total) by mouth daily. 90 tablet 3   polyethylene glycol (MIRALAX / GLYCOLAX) packet Take 17 g by mouth daily as needed for moderate constipation. 14 each 0   No current facility-administered medications for this visit.     ROS:  See HPI  Physical Exam:    Incision:  Well healed Extremities:  palpable radial pulse, palpable thrill, grip equal B UE     Findings:  +--------------------+----------+-----------------+--------+  AVF                 PSV (cm/s)Flow Vol (mL/min)Comments  +--------------------+----------+-----------------+--------+  Native artery inflow   134           351                 +--------------------+----------+-----------------+--------+  AVF Anastomosis        187                               +--------------------+----------+-----------------+--------+      +------------+----------+-------------+----------+-------------------+  OUTFLOW VEINPSV (cm/s)Diameter (cm)Depth (cm)     Describe        +------------+----------+-------------+----------+-------------------+  Prox UA  49        0.53        1.02                        +------------+----------+-------------+----------+-------------------+  Mid UA          89        0.57        0.89                        +------------+----------+-------------+----------+-------------------+  Dist UA        348        0.45        0.98                        +------------+----------+-------------+----------+-------------------+  AC Fossa       656        0.12        1.30   residual lumen <60mm  +------------+----------+-------------+----------+-------------------+     Summary:  Arteriovenous fistula-Residual lumen of less than 68mm noted at the level  of the  antecubital fossa, approximately 3.0 cm from the anastomosis.  Velocities <100cm/s observed in the proximal and mid upper arm segments of  the basilic outflow vein.    Assessment/Plan:  This is a  70 y.o. male who is s/p: The duplex shows that the fistula is maturing well.  He will be scheduled for second stage basilic transposition in the near future.  I gave him a squeeze ball to exercise his left hand with.  He has HD MWF.     Roxy Horseman PA-C [facility] [phone]   Clinic MD:  Donzetta Matters

## 2020-12-06 ENCOUNTER — Other Ambulatory Visit: Payer: Self-pay

## 2020-12-09 ENCOUNTER — Other Ambulatory Visit: Payer: Self-pay

## 2020-12-09 ENCOUNTER — Encounter (HOSPITAL_COMMUNITY): Payer: Self-pay | Admitting: Vascular Surgery

## 2020-12-09 NOTE — Progress Notes (Signed)
Spoke with pt. Last dose of plavix 12/08/20

## 2020-12-14 ENCOUNTER — Ambulatory Visit (HOSPITAL_COMMUNITY)
Admission: RE | Admit: 2020-12-14 | Discharge: 2020-12-14 | Disposition: A | Discharge status: Home / Self Care | Payer: Medicare Other | Attending: Vascular Surgery | Admitting: Vascular Surgery

## 2020-12-14 ENCOUNTER — Encounter (HOSPITAL_COMMUNITY): Payer: Self-pay | Admitting: Vascular Surgery

## 2020-12-14 ENCOUNTER — Ambulatory Visit (HOSPITAL_COMMUNITY): Payer: Medicare Other | Admitting: Physician Assistant

## 2020-12-14 ENCOUNTER — Other Ambulatory Visit: Payer: Self-pay

## 2020-12-14 ENCOUNTER — Encounter (HOSPITAL_COMMUNITY)
Admission: RE | Disposition: A | Discharge status: Home / Self Care | Payer: Self-pay | Source: Home / Self Care | Attending: Vascular Surgery

## 2020-12-14 DIAGNOSIS — Z79899 Other long term (current) drug therapy: Secondary | ICD-10-CM | POA: Insufficient documentation

## 2020-12-14 DIAGNOSIS — Z888 Allergy status to other drugs, medicaments and biological substances status: Secondary | ICD-10-CM | POA: Diagnosis not present

## 2020-12-14 DIAGNOSIS — Z7982 Long term (current) use of aspirin: Secondary | ICD-10-CM | POA: Diagnosis not present

## 2020-12-14 DIAGNOSIS — N186 End stage renal disease: Secondary | ICD-10-CM

## 2020-12-14 DIAGNOSIS — Z7902 Long term (current) use of antithrombotics/antiplatelets: Secondary | ICD-10-CM | POA: Insufficient documentation

## 2020-12-14 DIAGNOSIS — Z992 Dependence on renal dialysis: Secondary | ICD-10-CM | POA: Diagnosis not present

## 2020-12-14 DIAGNOSIS — T82898A Other specified complication of vascular prosthetic devices, implants and grafts, initial encounter: Secondary | ICD-10-CM

## 2020-12-14 HISTORY — PX: BASCILIC VEIN TRANSPOSITION: SHX5742

## 2020-12-14 HISTORY — DX: Personal history of other diseases of the digestive system: Z87.19

## 2020-12-14 LAB — POCT I-STAT, CHEM 8
BUN: 23 mg/dL (ref 8–23)
Calcium, Ion: 1.02 mmol/L — ABNORMAL LOW (ref 1.15–1.40)
Chloride: 94 mmol/L — ABNORMAL LOW (ref 98–111)
Creatinine, Ser: 7.4 mg/dL — ABNORMAL HIGH (ref 0.61–1.24)
Glucose, Bld: 119 mg/dL — ABNORMAL HIGH (ref 70–99)
HCT: 44 % (ref 39.0–52.0)
Hemoglobin: 15 g/dL (ref 13.0–17.0)
Potassium: 4.1 mmol/L (ref 3.5–5.1)
Sodium: 136 mmol/L (ref 135–145)
TCO2: 33 mmol/L — ABNORMAL HIGH (ref 22–32)

## 2020-12-14 LAB — GLUCOSE, CAPILLARY
Glucose-Capillary: 116 mg/dL — ABNORMAL HIGH (ref 70–99)
Glucose-Capillary: 184 mg/dL — ABNORMAL HIGH (ref 70–99)

## 2020-12-14 SURGERY — TRANSPOSITION, VEIN, BASILIC
Anesthesia: General | Site: Arm Upper | Laterality: Left

## 2020-12-14 MED ORDER — LIDOCAINE 2% (20 MG/ML) 5 ML SYRINGE
INTRAMUSCULAR | Status: DC | PRN
  Administered 2020-12-14: 60 mg via INTRAVENOUS

## 2020-12-14 MED ORDER — ONDANSETRON HCL 4 MG/2ML IJ SOLN
INTRAMUSCULAR | Status: DC | PRN
Start: 2020-12-14 — End: 2020-12-14
  Administered 2020-12-14: 4 mg via INTRAVENOUS

## 2020-12-14 MED ORDER — ONDANSETRON HCL 4 MG/2ML IJ SOLN
4.0000 mg | Freq: Four times a day (QID) | INTRAMUSCULAR | Status: DC | PRN
Start: 2020-12-14 — End: 2020-12-14

## 2020-12-14 MED ORDER — SODIUM CHLORIDE 0.9 % IV SOLN: INTRAVENOUS | Status: DC

## 2020-12-14 MED ORDER — LACTATED RINGERS IV SOLN: INTRAVENOUS | Status: DC

## 2020-12-14 MED ORDER — LIDOCAINE-EPINEPHRINE (PF) 1 %-1:200000 IJ SOLN
INTRAMUSCULAR | Status: AC
  Filled 2020-12-14: qty 30

## 2020-12-14 MED ORDER — HEPARIN 6000 UNIT IRRIGATION SOLUTION
Status: AC
  Filled 2020-12-14: qty 500

## 2020-12-14 MED ORDER — PROPOFOL 10 MG/ML IV BOLUS
INTRAVENOUS | Status: AC
  Filled 2020-12-14: qty 40

## 2020-12-14 MED ORDER — FENTANYL CITRATE (PF) 100 MCG/2ML IJ SOLN
INTRAMUSCULAR | Status: AC
  Filled 2020-12-14: qty 2

## 2020-12-14 MED ORDER — FENTANYL CITRATE (PF) 250 MCG/5ML IJ SOLN
INTRAMUSCULAR | Status: DC | PRN
  Administered 2020-12-14 (×3): 25 ug via INTRAVENOUS
  Administered 2020-12-14: 50 ug via INTRAVENOUS
  Administered 2020-12-14 (×2): 25 ug via INTRAVENOUS

## 2020-12-14 MED ORDER — PHENYLEPHRINE 40 MCG/ML (10ML) SYRINGE FOR IV PUSH (FOR BLOOD PRESSURE SUPPORT)
PREFILLED_SYRINGE | INTRAVENOUS | Status: AC
  Filled 2020-12-14: qty 10

## 2020-12-14 MED ORDER — CHLORHEXIDINE GLUCONATE 4 % EX LIQD: 60.0000 mL | Freq: Once | CUTANEOUS | Status: DC

## 2020-12-14 MED ORDER — HEPARIN 6000 UNIT IRRIGATION SOLUTION
Status: DC | PRN
  Administered 2020-12-14: 1

## 2020-12-14 MED ORDER — FENTANYL CITRATE (PF) 250 MCG/5ML IJ SOLN
INTRAMUSCULAR | Status: AC
  Filled 2020-12-14: qty 5

## 2020-12-14 MED ORDER — VASOPRESSIN 20 UNIT/ML IV SOLN
INTRAVENOUS | Status: AC
  Filled 2020-12-14: qty 1

## 2020-12-14 MED ORDER — OXYCODONE HCL 5 MG PO TABS
ORAL_TABLET | ORAL | Status: AC
  Filled 2020-12-14: qty 1

## 2020-12-14 MED ORDER — EPHEDRINE SULFATE-NACL 50-0.9 MG/10ML-% IV SOSY
PREFILLED_SYRINGE | INTRAVENOUS | Status: DC | PRN
  Administered 2020-12-14: 10 mg via INTRAVENOUS

## 2020-12-14 MED ORDER — ONDANSETRON HCL 4 MG/2ML IJ SOLN
INTRAMUSCULAR | Status: AC
  Filled 2020-12-14: qty 2

## 2020-12-14 MED ORDER — OXYCODONE-ACETAMINOPHEN 5-325 MG PO TABS: 1.0000 | ORAL_TABLET | Freq: Four times a day (QID) | ORAL | 0 refills | Status: DC | PRN

## 2020-12-14 MED ORDER — DEXAMETHASONE SODIUM PHOSPHATE 10 MG/ML IJ SOLN
INTRAMUSCULAR | Status: AC
  Filled 2020-12-14: qty 1

## 2020-12-14 MED ORDER — OXYCODONE HCL 5 MG PO TABS
5.0000 mg | ORAL_TABLET | Freq: Once | ORAL | Status: AC | PRN
  Administered 2020-12-14: 5 mg via ORAL

## 2020-12-14 MED ORDER — PROPOFOL 10 MG/ML IV BOLUS
INTRAVENOUS | Status: DC | PRN
  Administered 2020-12-14: 160 mg via INTRAVENOUS

## 2020-12-14 MED ORDER — OXYCODONE HCL 5 MG/5ML PO SOLN: 5.0000 mg | Freq: Once | ORAL | Status: AC | PRN

## 2020-12-14 MED ORDER — ORAL CARE MOUTH RINSE: 15.0000 mL | Freq: Once | OROMUCOSAL | Status: AC

## 2020-12-14 MED ORDER — SUCCINYLCHOLINE CHLORIDE 200 MG/10ML IV SOSY
PREFILLED_SYRINGE | INTRAVENOUS | Status: AC
  Filled 2020-12-14: qty 10

## 2020-12-14 MED ORDER — 0.9 % SODIUM CHLORIDE (POUR BTL) OPTIME
TOPICAL | Status: DC | PRN
  Administered 2020-12-14: 1000 mL

## 2020-12-14 MED ORDER — LIDOCAINE 2% (20 MG/ML) 5 ML SYRINGE
INTRAMUSCULAR | Status: AC
  Filled 2020-12-14: qty 5

## 2020-12-14 MED ORDER — DEXAMETHASONE SODIUM PHOSPHATE 10 MG/ML IJ SOLN
INTRAMUSCULAR | Status: DC | PRN
  Administered 2020-12-14: 10 mg via INTRAVENOUS

## 2020-12-14 MED ORDER — MIDAZOLAM HCL 2 MG/2ML IJ SOLN
INTRAMUSCULAR | Status: AC
  Filled 2020-12-14: qty 2

## 2020-12-14 MED ORDER — CHLORHEXIDINE GLUCONATE 0.12 % MT SOLN
15.0000 mL | Freq: Once | OROMUCOSAL | Status: AC
  Administered 2020-12-14: 15 mL via OROMUCOSAL
  Filled 2020-12-14: qty 15

## 2020-12-14 MED ORDER — HEMOSTATIC AGENTS (NO CHARGE) OPTIME
TOPICAL | Status: DC | PRN
  Administered 2020-12-14: 1 via TOPICAL

## 2020-12-14 MED ORDER — CEFAZOLIN SODIUM-DEXTROSE 2-4 GM/100ML-% IV SOLN
2.0000 g | INTRAVENOUS | Status: AC
  Administered 2020-12-14: 2 g via INTRAVENOUS
  Filled 2020-12-14: qty 100

## 2020-12-14 MED ORDER — FENTANYL CITRATE (PF) 100 MCG/2ML IJ SOLN
25.0000 ug | INTRAMUSCULAR | Status: DC | PRN
  Administered 2020-12-14: 25 ug via INTRAVENOUS

## 2020-12-14 SURGICAL SUPPLY — 40 items
ADH SKN CLS APL DERMABOND .7 (GAUZE/BANDAGES/DRESSINGS) ×1
ARMBAND PINK RESTRICT EXTREMIT (MISCELLANEOUS) ×2 IMPLANT
BAG COUNTER SPONGE SURGICOUNT (BAG) ×2 IMPLANT
BAG SPNG CNTER NS LX DISP (BAG) ×1
CANISTER SUCT 3000ML PPV (MISCELLANEOUS) ×2 IMPLANT
CLIP LIGATING EXTRA MED SLVR (CLIP) ×2 IMPLANT
CLIP LIGATING EXTRA SM BLUE (MISCELLANEOUS) ×2 IMPLANT
CLIP VESOCCLUDE MED 6/CT (CLIP) IMPLANT
CLIP VESOCCLUDE SM WIDE 24/CT (CLIP) IMPLANT
CLIP VESOCCLUDE SM WIDE 6/CT (CLIP) IMPLANT
COVER PROBE W GEL 5X96 (DRAPES) ×2 IMPLANT
DERMABOND ADVANCED (GAUZE/BANDAGES/DRESSINGS) ×1
DERMABOND ADVANCED .7 DNX12 (GAUZE/BANDAGES/DRESSINGS) ×1 IMPLANT
ELECT REM PT RETURN 9FT ADLT (ELECTROSURGICAL) ×2
ELECTRODE REM PT RTRN 9FT ADLT (ELECTROSURGICAL) ×1 IMPLANT
GAUZE 4X4 16PLY ~~LOC~~+RFID DBL (SPONGE) ×1 IMPLANT
GLOVE SURG ENC MOIS LTX SZ7.5 (GLOVE) ×2 IMPLANT
GLOVE SURG MICRO LTX SZ6.5 (GLOVE) ×2 IMPLANT
GLOVE SURG UNDER POLY LF SZ6.5 (GLOVE) ×2 IMPLANT
GLOVE SURG UNDER POLY LF SZ7 (GLOVE) ×1 IMPLANT
GOWN STRL REUS W/ TWL LRG LVL3 (GOWN DISPOSABLE) ×2 IMPLANT
GOWN STRL REUS W/ TWL XL LVL3 (GOWN DISPOSABLE) ×1 IMPLANT
GOWN STRL REUS W/TWL LRG LVL3 (GOWN DISPOSABLE) ×4
GOWN STRL REUS W/TWL XL LVL3 (GOWN DISPOSABLE) ×2
KIT BASIN OR (CUSTOM PROCEDURE TRAY) ×2 IMPLANT
KIT TURNOVER KIT B (KITS) ×2 IMPLANT
NS IRRIG 1000ML POUR BTL (IV SOLUTION) ×2 IMPLANT
PACK CV ACCESS (CUSTOM PROCEDURE TRAY) ×2 IMPLANT
PAD ARMBOARD 7.5X6 YLW CONV (MISCELLANEOUS) ×4 IMPLANT
POWDER SURGICEL 3.0 GRAM (HEMOSTASIS) ×2 IMPLANT
SPONGE T-LAP 18X18 ~~LOC~~+RFID (SPONGE) ×4 IMPLANT
SUT MNCRL AB 4-0 PS2 18 (SUTURE) ×12 IMPLANT
SUT PROLENE 5 0 C 1 36 (SUTURE) ×1 IMPLANT
SUT PROLENE 6 0 BV (SUTURE) ×12 IMPLANT
SUT SILK 2 0 SH (SUTURE) IMPLANT
SUT VIC AB 3-0 SH 27 (SUTURE) ×10
SUT VIC AB 3-0 SH 27X BRD (SUTURE) ×1 IMPLANT
TOWEL GREEN STERILE (TOWEL DISPOSABLE) ×2 IMPLANT
UNDERPAD 30X36 HEAVY ABSORB (UNDERPADS AND DIAPERS) ×2 IMPLANT
WATER STERILE IRR 1000ML POUR (IV SOLUTION) ×2 IMPLANT

## 2020-12-14 NOTE — Op Note (Signed)
Patient name: Jon Anderson MRN: [id number] DOB: 07-29-1950 Sex: male  12/14/2020 Pre-operative Diagnosis: esrd Post-operative diagnosis:  Same Surgeon:  Erlene Quan C. Donzetta Matters, MD Assistant: Leontine Locket, PA Procedure Performed: Revision of left arm basilic vein fistula with transposition and interposition cephalic vein graft  Indications: 70 year old male with end-stage renal disease currently on dialysis via catheter.  He initially had a cephalic vein AV fistula that thrombosed subsequently had a first stage basilic vein fistula he had to be taken back and have this thrombectomized.  It is failed to mature but has flow through the fistula he is now indicated for revision with transposition with possible grafting.  An assistant was necessary to facilitate exposure and expedite the case.  Findings: The fistula near the anastomosis was sclerotic there was hardly any flow in the vein more cephalad was only patent from branch veins.  We are able to harvest the fistula but this was clear would not reach back to any suitable area to create a fistula that would easily be cannulated.  The previous cephalic vein fistula was patent by duplex for approximately 2 cm and I elected to use this.  I also harvested the cephalic vein more cephalad in the arm to use as an interposition graft.  We tunneled the basilic vein on the medial aspect of the upper arm and then used the proximal aspect of the cephalic vein AV fistula with an interposition graft of the cephalic vein more cephalad and at completion there was a very strong thrill in the fistula and a palpable radial artery pulse at the wrist.   Procedure:  The patient was identified in the holding area and taken to the operating was placed supine on upper table and LMA anesthesia was induced.  He was sterilely prepped and draped in the left upper extremity usual fashion, antibiotics were ministered and a timeout was called.  I began using ultrasound to identify  his basilic vein fistula was quite sclerotic near the antecubitum.  I made 2 incisions higher up on the arm dissected out the fistula dividing branches tween clips and ties.  I did have to divide the nerve in 2 separate places between clips to get full exposure.  The fistula here had minimal flow but had matured to approximately 5 mm diameter.  We did mobilize it for the entire course including up in the axilla.  I then clamped in the axilla and transected near the antecubitum.  There was no antegrade bleeding into the fistula from the antecubitum we tied off the fistula proximally anyway.  We flushed with heparinized saline and clamped it.  I then made a transverse incision over the previous cephalic vein AV fistula above the antecubitum.  I dissected this out for several centimeters.  There was 1 large pseudoaneurysm but I did have healthy appearing fistula proximally and there was good pulsatility there.  I dissected back to the arterial anastomosis and clamped this fistula.  I transected it.  I oversewed the fistula distally with 5-0 Prolene suture in a mattress fashion.  Proximally I performed endarterectomy of the fistula.  It was quite clear that our existing basilic vein would not reach the cephalic vein fistula.  I used ultrasound higher up on the arm and identified old cephalic vein fistula that was nonthrombosed.  Longitudinal incision was made this counted our fifth incision.  We dissected out the old vein for approximately 5 cm clamped it distally proximally transected and tied them off on both sides.  We then flushed the old vein spatulated on both sides.  I tunneled the basilic vein I was able to get it on the medial upper arm medial to the old cephalic vein fistula but a site that I thought would be easily cannulated.  We then spatulated the cephalic vein fistula and sewed it into and to the proximal cephalic vein fistula.  We marked this for orientation.  We tunneled our basilic vein as mentioned  above on the medial upper arm and marked it for orientation.  Spatulated both ends and then sewed this to the cephalic vein bridge graft.  Prior to completion without flushing all directions.  Upon completion there was very strong thrill in the basilic vein palpable in the medial upper arm and a palpable radial artery pulse the wrist and both these were confirmed with Doppler.  We irrigated all 5 wounds and obtain pneumostasis.  We closed in layers of Vicryl and Monocryl.  Dermabond is placed at the level of the skin.  He was awakened from anesthesia having tolerated procedure without any complication.  All counts were correct at completion.  EBL: 200 cc    Alpheus Stiff C. Donzetta Matters, MD [facility] [place] Office: [phone] Pager: [phone]

## 2020-12-14 NOTE — Discharge Instructions (Signed)
   [facility]  Discharge Instructions  AV Fistula or Graft Surgery for Dialysis Access  Please refer to the following instructions for your post-procedure care. Your surgeon or physician assistant will discuss any changes with you.  Activity  You may drive the day following your surgery, if you are comfortable and no longer taking prescription pain medication. Resume full activity as the soreness in your incision resolves.  Bathing/Showering  You may shower after you go home. Keep your incision dry for 48 hours. Do not soak in a bathtub, hot tub, or swim until the incision heals completely. You may not shower if you have a hemodialysis catheter.  Incision Care  Clean your incision with mild soap and water after 48 hours. Pat the area dry with a clean towel. You do not need a bandage unless otherwise instructed. Do not apply any ointments or creams to your incision. You may have skin glue on your incision. Do not peel it off. It will come off on its own in about one week. Your arm may swell a bit after surgery. To reduce swelling use pillows to elevate your arm so it is above your heart. Your doctor will tell you if you need to lightly wrap your arm with an ACE bandage.  Diet  Resume your normal diet. There are not special food restrictions following this procedure. In order to heal from your surgery, it is CRITICAL to get adequate nutrition. Your body requires vitamins, minerals, and protein. Vegetables are the best source of vitamins and minerals. Vegetables also provide the perfect balance of protein. Processed food has little nutritional value, so try to avoid this.  Medications  Resume taking all of your medications. If your incision is causing pain, you may take over-the counter pain relievers such as acetaminophen (Tylenol). If you were prescribed a stronger pain medication, please be aware these medications can cause nausea and constipation. Prevent  nausea by taking the medication with a snack or meal. Avoid constipation by drinking plenty of fluids and eating foods with high amount of fiber, such as fruits, vegetables, and grains.  Do not take Tylenol if you are taking prescription pain medications.  Follow up Your surgeon may want to see you in the office following your access surgery. If so, this will be arranged at the time of your surgery.  Please call us immediately for any of the following conditions:  Increased pain, redness, drainage (pus) from your incision site Fever of 101 degrees or higher Severe or worsening pain at your incision site Hand pain or numbness.  Reduce your risk of vascular disease:  Stop smoking. If you would like help, call [facility] at 1-800-QUIT-NOW 432 731 2083) or [place] at [place] your cholesterol Maintain a desired weight Control your diabetes Keep your blood pressure down  Dialysis  It will take several weeks to several months for your new dialysis access to be ready for use. Your surgeon will determine when it is okay to use it. Your nephrologist will continue to direct your dialysis. You can continue to use your Permcath until your new access is ready for use.   12/14/2020 LECIL TAPP [id number] 08/08/1950  Surgeon(s): Waynetta Sandy, MD  Procedure(s): 2nd stage left basilic vein transposition  x Do not stick fistula for 8 weeks    If you have any questions, please call the office at [phone].

## 2020-12-14 NOTE — Anesthesia Preprocedure Evaluation (Signed)
Anesthesia Evaluation  Patient identified by MRN, date of birth, ID band Patient awake    Reviewed: Allergy & Precautions, H&P , NPO status , Patient's Chart, lab work & pertinent test results  Airway Mallampati: II   Neck ROM: full    Dental   Pulmonary shortness of breath, sleep apnea , former smoker,    breath sounds clear to auscultation       Cardiovascular hypertension, + CAD, + CABG and + Peripheral Vascular Disease   Rhythm:regular Rate:Normal     Neuro/Psych  Neuromuscular disease    GI/Hepatic hiatal hernia, GERD  ,  Endo/Other  diabetes, Type 2  Renal/GU ESRF and DialysisRenal disease     Musculoskeletal  (+) Arthritis ,   Abdominal   Peds  Hematology   Anesthesia Other Findings   Reproductive/Obstetrics                             Anesthesia Physical Anesthesia Plan  ASA: 3  Anesthesia Plan: General   Post-op Pain Management:    Induction: Intravenous  PONV Risk Score and Plan: 2 and Ondansetron and Treatment may vary due to age or medical condition  Airway Management Planned: LMA  Additional Equipment:   Intra-op Plan:   Post-operative Plan: Extubation in OR  Informed Consent: I have reviewed the patients History and Physical, chart, labs and discussed the procedure including the risks, benefits and alternatives for the proposed anesthesia with the patient or authorized representative who has indicated his/her understanding and acceptance.     Dental advisory given  Plan Discussed with: CRNA, Anesthesiologist and Surgeon  Anesthesia Plan Comments:         Anesthesia Quick Evaluation

## 2020-12-14 NOTE — Interval H&P Note (Signed)
History and Physical Interval Note:  12/14/2020 7:30 AM  Jon Anderson  has presented today for surgery, with the diagnosis of ESRD.  The various methods of treatment have been discussed with the patient and family. After consideration of risks, benefits and other options for treatment, the patient has consented to  Procedure(s): LEFT ARM SECOND STAGE BASILIC VEIN TRANSPOSITION (Left) as a surgical intervention.  The patient's history has been reviewed, patient examined, no change in status, stable for surgery.  I have reviewed the patient's chart and labs.  Questions were answered to the patient's satisfaction.     [name]

## 2020-12-14 NOTE — Transfer of Care (Signed)
Immediate Anesthesia Transfer of Care Note  Patient: Jon Anderson  Procedure(s) Performed: REVISION OF LEFT ARM SECOND STAGE BASILIC VEIN TRANSPOSITION (Left: Arm Upper)  Patient Location: PACU  Anesthesia Type:General  Level of Consciousness: drowsy and patient cooperative  Airway & Oxygen Therapy: Patient Spontanous Breathing  Post-op Assessment: Report given to RN and Post -op Vital signs reviewed and stable  Post vital signs: Reviewed and stable  Last Vitals:  Vitals Value Taken Time  BP 132/92 12/14/20 1030  Temp    Pulse 87 12/14/20 1031  Resp 17 12/14/20 1031  SpO2 96 % 12/14/20 1031  Vitals shown include unvalidated device data.  Last Pain:  Vitals:   12/14/20 0623  TempSrc:   PainSc: 0-No pain         Complications: No notable events documented.

## 2020-12-14 NOTE — Anesthesia Procedure Notes (Signed)
Procedure Name: LMA Insertion Date/Time: 12/14/2020 7:36 AM Performed by: Lance Coon, CRNA Pre-anesthesia Checklist: Patient identified, Emergency Drugs available, Suction available, Patient being monitored and Timeout performed Patient Re-evaluated:Patient Re-evaluated prior to induction Oxygen Delivery Method: Circle system utilized Preoxygenation: Pre-oxygenation with 100% oxygen Induction Type: IV induction LMA: LMA inserted LMA Size: 4.0 Number of attempts: 1 Placement Confirmation: positive ETCO2 and breath sounds checked- equal and bilateral Tube secured with: Tape Dental Injury: Teeth and Oropharynx as per pre-operative assessment

## 2020-12-15 ENCOUNTER — Encounter (HOSPITAL_COMMUNITY): Payer: Self-pay | Admitting: Vascular Surgery

## 2020-12-15 NOTE — Anesthesia Postprocedure Evaluation (Signed)
Anesthesia Post Note  Patient: Jon Anderson  Procedure(s) Performed: REVISION OF LEFT ARM SECOND STAGE BASILIC VEIN TRANSPOSITION (Left: Arm Upper)     Patient location during evaluation: PACU Anesthesia Type: General Level of consciousness: awake and alert Pain management: pain level controlled Vital Signs Assessment: post-procedure vital signs reviewed and stable Respiratory status: spontaneous breathing, nonlabored ventilation, respiratory function stable and patient connected to nasal cannula oxygen Cardiovascular status: blood pressure returned to baseline and stable Postop Assessment: no apparent nausea or vomiting Anesthetic complications: no   No notable events documented.  Last Vitals:  Vitals:   12/14/20 1115 12/14/20 1130  BP: (!) 141/79 135/75  Pulse: 83 84  Resp: 18 20  Temp:  (!) 36.2 C  SpO2: 96% 93%    Last Pain:  Vitals:   12/14/20 1130  TempSrc:   PainSc: [name]

## 2020-12-16 ENCOUNTER — Encounter (HOSPITAL_COMMUNITY): Payer: Medicare Other

## 2020-12-27 ENCOUNTER — Encounter (HOSPITAL_COMMUNITY): Payer: Self-pay | Admitting: *Deleted

## 2020-12-31 ENCOUNTER — Other Ambulatory Visit: Payer: Self-pay | Admitting: *Deleted

## 2020-12-31 DIAGNOSIS — N186 End stage renal disease: Secondary | ICD-10-CM

## 2020-12-31 DIAGNOSIS — Z992 Dependence on renal dialysis: Secondary | ICD-10-CM

## 2021-01-05 ENCOUNTER — Encounter: Payer: Self-pay | Admitting: Physician Assistant

## 2021-01-05 ENCOUNTER — Ambulatory Visit (INDEPENDENT_AMBULATORY_CARE_PROVIDER_SITE_OTHER): Payer: Medicare Other | Admitting: Physician Assistant

## 2021-01-05 ENCOUNTER — Other Ambulatory Visit: Payer: Self-pay

## 2021-01-05 ENCOUNTER — Ambulatory Visit (HOSPITAL_COMMUNITY)
Admission: RE | Admit: 2021-01-05 | Discharge: 2021-01-05 | Disposition: A | Discharge status: Home / Self Care | Payer: Medicare Other | Source: Ambulatory Visit | Attending: Physician Assistant | Admitting: Physician Assistant

## 2021-01-05 VITALS — BP 73/50 | HR 89 | Temp 98.6°F | Resp 20 | Ht 71.0 in | Wt 167.3 lb

## 2021-01-05 DIAGNOSIS — Z992 Dependence on renal dialysis: Secondary | ICD-10-CM

## 2021-01-05 DIAGNOSIS — N186 End stage renal disease: Secondary | ICD-10-CM | POA: Insufficient documentation

## 2021-01-05 NOTE — Progress Notes (Signed)
Established Dialysis Access   History of Present Illness   Jon Anderson is a 70 y.o. ([birth year]) male who presents for re-evaluation of permanent access.  He is status post second stage basilic vein transposition with Dr. Donzetta Matters on 12/14/2020.  He denies any signs or symptoms of steal syndrome in his left hand.  He is dialyzing via right IJ [facility] on a Monday Wednesday Friday schedule at the [facility] location.  He states incisions of his left arm are healed.  Current Outpatient Medications  Medication Sig Dispense Refill   aspirin EC 81 MG tablet Take 1 tablet (81 mg total) by mouth daily.     atorvastatin (LIPITOR) 40 MG tablet Take 1 tablet (40 mg total) by mouth daily at 6 PM. 30 tablet 5   calcitRIOL (ROCALTROL) 0.25 MCG capsule Take 0.25 mcg by mouth every Monday, Wednesday, and Friday.     calcium acetate (PHOSLO) 667 MG capsule Take 2 capsules (1,334 mg total) by mouth 2 (two) times daily with a meal. 180 capsule 0   clopidogrel (PLAVIX) 75 MG tablet Take 75 mg by mouth daily.     gabapentin (NEURONTIN) 100 MG capsule Take 100-200 mg by mouth See admin instructions. 100 mg in the morning, 200 mg in the evening     loperamide (IMODIUM) 2 MG capsule Take 2 mg by mouth as needed for diarrhea or loose stools.     midodrine (PROAMATINE) 10 MG tablet Take 10 mg by mouth in the morning and at bedtime.     multivitamin (RENA-VIT) TABS tablet Take 1 tablet by mouth at bedtime. 30 tablet 0   oxyCODONE-acetaminophen (PERCOCET) 5-325 MG tablet Take 1 tablet by mouth every 6 (six) hours as needed for moderate pain. 20 tablet 0   pantoprazole (PROTONIX) 40 MG tablet Take 40 mg by mouth daily.     polyethylene glycol (MIRALAX / GLYCOLAX) packet Take 17 g by mouth daily as needed for moderate constipation. 14 each 0   pentoxifylline (TRENTAL) 400 MG CR tablet Take 1 tablet (400 mg total) by mouth daily. (Patient not taking: Reported on 12/06/2020) 90 tablet 3   No current facility-administered  medications for this visit.    REVIEW OF SYSTEMS (negative unless checked):   Cardiac:  []  Chest pain or chest pressure? []  Shortness of breath upon activity? []  Shortness of breath when lying flat? []  Irregular heart rhythm?  Vascular:  []  Pain in calf, thigh, or hip brought on by walking? []  Pain in feet at night that wakes you up from your sleep? []  Blood clot in your veins? []  Leg swelling?  Pulmonary:  []  Oxygen at home? []  Productive cough? []  Wheezing?  Neurologic:  []  Sudden weakness in arms or legs? []  Sudden numbness in arms or legs? []  Sudden onset of difficult speaking or slurred speech? []  Temporary loss of vision in one eye? []  Problems with dizziness?  Gastrointestinal:  []  Blood in stool? []  Vomited blood?  Genitourinary:  []  Burning when urinating? []  Blood in urine?  Psychiatric:  []  Major depression  Hematologic:  []  Bleeding problems? []  Problems with blood clotting?  Dermatologic:  []  Rashes or ulcers?  Constitutional:  []  Fever or chills?  Ear/Nose/Throat:  []  Change in hearing? []  Nose bleeds? []  Sore throat?  Musculoskeletal:  []  Back pain? []  Joint pain? []  Muscle pain?   Physical Examination   Vitals:   01/05/21 1554  BP: (!) 73/50  Pulse: 89  Resp: 20  Temp: 98.6  F (37 C)  TempSrc: Temporal  SpO2: 97%  Weight: 167 lb 4.8 oz (75.9 kg)  Height: 5\' 11"  (1.803 m)   Body mass index is 23.33 kg/m.  General:  WDWN in NAD; vital signs documented above Gait: Not observed HENT: WNL, normocephalic Pulmonary: normal non-labored breathing , without Rales, rhonchi,  wheezing Cardiac: regular HR Abdomen: soft, NT, no masses Skin: without rashes Vascular Exam/Pulses:  Right Left  Radial 1+ (weak) 1+ (weak)   Extremities: without ischemic changes, without Gangrene , without cellulitis; without open wounds;  Musculoskeletal: no muscle wasting or atrophy  Neurologic: A&O X 3;  No focal weakness or paresthesias are  detected Psychiatric:  The pt has Normal affect.   Non-invasive Vascular Imaging   Stenotic left arm basilic vein fistula    Medical Decision Making   Jon Anderson is a 70 y.o. male who presents with ESRD requiring hemodialysis.   Patent left arm basilic vein fistula however duplex demonstrates a area of stenosis in the distal upper arm.  All incisions of left arm with healed.  We will plan on left arm fistulogram with possible intervention in the near future.  This will be an attempt at fistula salvage.  If unsuccessful [name].  Patient is aware of this and agrees to proceed.  He will continue HD via right IJ TDC in the meantime.   Dagoberto Ligas PA-C [facility] [place] Office: [phone]  Clinic MD: Donzetta Matters

## 2021-01-05 NOTE — H&P (View-Only) (Signed)
Established Dialysis Access   History of Present Illness   Jon Anderson is a 70 y.o. (1950/05/17) male who presents for re-evaluation of permanent access.  He is status post second stage basilic vein transposition with Dr. Donzetta Matters on 12/14/2020.  He denies any signs or symptoms of steal syndrome in his left hand.  He is dialyzing via right IJ [facility] on a Monday Wednesday Friday schedule at the [facility] location.  He states incisions of his left arm are healed.  Current Outpatient Medications  Medication Sig Dispense Refill   aspirin EC 81 MG tablet Take 1 tablet (81 mg total) by mouth daily.     atorvastatin (LIPITOR) 40 MG tablet Take 1 tablet (40 mg total) by mouth daily at 6 PM. 30 tablet 5   calcitRIOL (ROCALTROL) 0.25 MCG capsule Take 0.25 mcg by mouth every Monday, Wednesday, and Friday.     calcium acetate (PHOSLO) 667 MG capsule Take 2 capsules (1,334 mg total) by mouth 2 (two) times daily with a meal. 180 capsule 0   clopidogrel (PLAVIX) 75 MG tablet Take 75 mg by mouth daily.     gabapentin (NEURONTIN) 100 MG capsule Take 100-200 mg by mouth See admin instructions. 100 mg in the morning, 200 mg in the evening     loperamide (IMODIUM) 2 MG capsule Take 2 mg by mouth as needed for diarrhea or loose stools.     midodrine (PROAMATINE) 10 MG tablet Take 10 mg by mouth in the morning and at bedtime.     multivitamin (RENA-VIT) TABS tablet Take 1 tablet by mouth at bedtime. 30 tablet 0   oxyCODONE-acetaminophen (PERCOCET) 5-325 MG tablet Take 1 tablet by mouth every 6 (six) hours as needed for moderate pain. 20 tablet 0   pantoprazole (PROTONIX) 40 MG tablet Take 40 mg by mouth daily.     polyethylene glycol (MIRALAX / GLYCOLAX) packet Take 17 g by mouth daily as needed for moderate constipation. 14 each 0   pentoxifylline (TRENTAL) 400 MG CR tablet Take 1 tablet (400 mg total) by mouth daily. (Patient not taking: Reported on 12/06/2020) 90 tablet 3   No current facility-administered  medications for this visit.    REVIEW OF SYSTEMS (negative unless checked):   Cardiac:  []  Chest pain or chest pressure? []  Shortness of breath upon activity? []  Shortness of breath when lying flat? []  Irregular heart rhythm?  Vascular:  []  Pain in calf, thigh, or hip brought on by walking? []  Pain in feet at night that wakes you up from your sleep? []  Blood clot in your veins? []  Leg swelling?  Pulmonary:  []  Oxygen at home? []  Productive cough? []  Wheezing?  Neurologic:  []  Sudden weakness in arms or legs? []  Sudden numbness in arms or legs? []  Sudden onset of difficult speaking or slurred speech? []  Temporary loss of vision in one eye? []  Problems with dizziness?  Gastrointestinal:  []  Blood in stool? []  Vomited blood?  Genitourinary:  []  Burning when urinating? []  Blood in urine?  Psychiatric:  []  Major depression  Hematologic:  []  Bleeding problems? []  Problems with blood clotting?  Dermatologic:  []  Rashes or ulcers?  Constitutional:  []  Fever or chills?  Ear/Nose/Throat:  []  Change in hearing? []  Nose bleeds? []  Sore throat?  Musculoskeletal:  []  Back pain? []  Joint pain? []  Muscle pain?   Physical Examination   Vitals:   01/05/21 1554  BP: (!) 73/50  Pulse: 89  Resp: 20  Temp: 98.6  F (37 C)  TempSrc: Temporal  SpO2: 97%  Weight: 167 lb 4.8 oz (75.9 kg)  Height: 5\' 11"  (1.803 m)   Body mass index is 23.33 kg/m.  General:  WDWN in NAD; vital signs documented above Gait: Not observed HENT: WNL, normocephalic Pulmonary: normal non-labored breathing , without Rales, rhonchi,  wheezing Cardiac: regular HR Abdomen: soft, NT, no masses Skin: without rashes Vascular Exam/Pulses:  Right Left  Radial 1+ (weak) 1+ (weak)   Extremities: without ischemic changes, without Gangrene , without cellulitis; without open wounds;  Musculoskeletal: no muscle wasting or atrophy  Neurologic: A&O X 3;  No focal weakness or paresthesias are  detected Psychiatric:  The pt has Normal affect.   Non-invasive Vascular Imaging   Stenotic left arm basilic vein fistula    Medical Decision Making   Jon Anderson is a 70 y.o. male who presents with ESRD requiring hemodialysis.   Patent left arm basilic vein fistula however duplex demonstrates a area of stenosis in the distal upper arm.  All incisions of left arm with healed.  We will plan on left arm fistulogram with possible intervention in the near future.  This will be an attempt at fistula salvage.  If unsuccessful [name].  Patient is aware of this and agrees to proceed.  He will continue HD via right IJ TDC in the meantime.   Dagoberto Ligas PA-C [facility] [place] Office: [phone]  Clinic MD: Donzetta Matters

## 2021-01-07 ENCOUNTER — Other Ambulatory Visit: Payer: Self-pay

## 2021-01-07 ENCOUNTER — Encounter: Payer: Self-pay | Admitting: Podiatry

## 2021-01-07 ENCOUNTER — Ambulatory Visit (INDEPENDENT_AMBULATORY_CARE_PROVIDER_SITE_OTHER): Payer: Medicare Other | Admitting: Podiatry

## 2021-01-07 DIAGNOSIS — B351 Tinea unguium: Secondary | ICD-10-CM | POA: Diagnosis not present

## 2021-01-07 DIAGNOSIS — Z89431 Acquired absence of right foot: Secondary | ICD-10-CM | POA: Diagnosis not present

## 2021-01-07 DIAGNOSIS — E1151 Type 2 diabetes mellitus with diabetic peripheral angiopathy without gangrene: Secondary | ICD-10-CM | POA: Diagnosis not present

## 2021-01-07 DIAGNOSIS — L84 Corns and callosities: Secondary | ICD-10-CM | POA: Diagnosis not present

## 2021-01-07 NOTE — Progress Notes (Signed)
Subjective:  Patient ID: Jon Anderson, male    DOB: 08-23-1950,  MRN: [id number]  70 y.o. male presents with at risk foot care. Patient has h/o amputation of Transmetatarsal amputation right foot and callus(es) plantar aspect of left foot and painful thick toenails that are difficult to trim. Painful toenails interfere with ambulation. Aggravating factors include wearing enclosed shoe gear. Pain is relieved with periodic professional debridement. Painful calluses are aggravated when weightbearing with and without shoegear. Pain is relieved with periodic professional debridement..    Patient's blood sugar was 134 mg/dl yesterday. He did not check today.   He states he is having issues with his dialysis access and will follow up with Vascular Surgery next week. He is followed by Dr. [name].  PCP: [name], MD and last visit was: September, 2022.  Review of Systems: Negative except as noted in the HPI.   Allergies  Allergen Reactions   Kiwi Extract Itching, Swelling and Other (See Comments)    Lips and face swell- breathing not affected   Flexeril [Cyclobenzaprine]     Hands become flimsy, can not hold things   Tape Other (See Comments)    "Plastic" tape causes blisters!!    Objective:  There were no vitals filed for this visit. Constitutional Patient is a pleasant 70 y.o. African American male WD, WN in NAD. AAO x 3.  Vascular Capillary fill time to digits <4 seconds.  DP/PT pulse(s) are nonpalpable b/l lower extremities. Pedal hair absent b/l. Lower extremity skin temperature gradient warm to cool b/l. No pain with calf compression b/l. No cyanosis or clubbing noted. Trace edema noted b/l lower extremities.  Neurologic Protective sensation diminished with 10g monofilament b/l.  Dermatologic Pedal skin is thin, shiny and atrophic b/l.  No open wounds b/l lower extremities. No interdigital macerations b/l lower extremities. Toenails 1-5 b/l elongated, discolored, dystrophic,  thickened, crumbly with subungual debris and tenderness to dorsal palpation. Pedal skin is thin shiny, atrophic b/l lower extremities. No open wounds b/l lower extremities. No interdigital macerations left foot. Toenails 1-5 left severely elongated, thickened with discoloration, also possessing excessive curvature and impinging onto pedal the distal tips of the digits. No erythema, no edema, no drainage, no fluctuance. Hyperkeratotic lesion(s) distal stump right foot and submet head 5 left foot.  No erythema, no edema, no drainage, no fluctuance.  Orthopedic: Muscle strength 5/5 to all LE muscle groups of b/l lower extremities. Lower extremity amputation(s): Transmetatarsal amputation right foot.   1. Onychomycosis   2. Callus   3. S/P transmetatarsal amputation of foot, right ([place])   4. Type II diabetes mellitus with peripheral circulatory disorder [facility])    Plan:  Patient was evaluated and treated and all questions answered. Consent given for treatment as described below: -No new findings. No new orders. -He will be scheduled with EJ, our Pedorthist for diabetic shoes. Will need toe filler right foot and offload submet head 5 left foot. -Calluses gently filed with dremel right foot and submet head 5 left foot. -Continue diabetic foot care principles: inspect feet daily, monitor glucose as recommended by PCP and/or Endocrinologist, and follow prescribed diet per PCP, Endocrinologist and/or dietician. -Patient to continue soft, supportive shoe gear daily. -Toenails 1-5 left debrided in length and girth without iatrogenic bleeding with sterile nail nipper and dremel.  -Patient to report any pedal injuries to medical professional immediately. -Patient/POA to call should there be question/concern in the interim.  Return in about 3 months (around 04/08/2021).  Stephani Police  Elisha Ponder, DPM

## 2021-01-10 ENCOUNTER — Ambulatory Visit (HOSPITAL_COMMUNITY)
Admission: RE | Admit: 2021-01-10 | Discharge: 2021-01-10 | Disposition: A | Discharge status: Home / Self Care | Payer: Medicare Other | Attending: Vascular Surgery | Admitting: Vascular Surgery

## 2021-01-10 ENCOUNTER — Other Ambulatory Visit: Payer: Self-pay

## 2021-01-10 ENCOUNTER — Encounter (HOSPITAL_COMMUNITY)
Admission: RE | Disposition: A | Discharge status: Home / Self Care | Payer: Self-pay | Source: Home / Self Care | Attending: Vascular Surgery

## 2021-01-10 DIAGNOSIS — N186 End stage renal disease: Secondary | ICD-10-CM

## 2021-01-10 DIAGNOSIS — Z7982 Long term (current) use of aspirin: Secondary | ICD-10-CM | POA: Diagnosis not present

## 2021-01-10 DIAGNOSIS — Z79899 Other long term (current) drug therapy: Secondary | ICD-10-CM | POA: Insufficient documentation

## 2021-01-10 DIAGNOSIS — Z992 Dependence on renal dialysis: Secondary | ICD-10-CM | POA: Insufficient documentation

## 2021-01-10 DIAGNOSIS — T82510A Breakdown (mechanical) of surgically created arteriovenous fistula, initial encounter: Secondary | ICD-10-CM | POA: Insufficient documentation

## 2021-01-10 DIAGNOSIS — Y841 Kidney dialysis as the cause of abnormal reaction of the patient, or of later complication, without mention of misadventure at the time of the procedure: Secondary | ICD-10-CM | POA: Diagnosis not present

## 2021-01-10 DIAGNOSIS — T82898A Other specified complication of vascular prosthetic devices, implants and grafts, initial encounter: Secondary | ICD-10-CM

## 2021-01-10 DIAGNOSIS — Z7902 Long term (current) use of antithrombotics/antiplatelets: Secondary | ICD-10-CM | POA: Diagnosis not present

## 2021-01-10 HISTORY — PX: PERIPHERAL VASCULAR INTERVENTION: CATH118257

## 2021-01-10 HISTORY — PX: A/V FISTULAGRAM: CATH118298

## 2021-01-10 LAB — POCT I-STAT, CHEM 8
BUN: 41 mg/dL — ABNORMAL HIGH (ref 8–23)
Calcium, Ion: 1.03 mmol/L — ABNORMAL LOW (ref 1.15–1.40)
Chloride: 97 mmol/L — ABNORMAL LOW (ref 98–111)
Creatinine, Ser: 11.4 mg/dL — ABNORMAL HIGH (ref 0.61–1.24)
Glucose, Bld: 113 mg/dL — ABNORMAL HIGH (ref 70–99)
HCT: 41 % (ref 39.0–52.0)
Hemoglobin: 13.9 g/dL (ref 13.0–17.0)
Potassium: 4.6 mmol/L (ref 3.5–5.1)
Sodium: 138 mmol/L (ref 135–145)
TCO2: 29 mmol/L (ref 22–32)

## 2021-01-10 LAB — GLUCOSE, CAPILLARY: Glucose-Capillary: 107 mg/dL — ABNORMAL HIGH (ref 70–99)

## 2021-01-10 SURGERY — A/V FISTULAGRAM
Anesthesia: LOCAL | Laterality: Left

## 2021-01-10 MED ORDER — FAMOTIDINE IN NACL 20-0.9 MG/50ML-% IV SOLN
20.0000 mg | Freq: Once | INTRAVENOUS | Status: AC
  Administered 2021-01-10: 20 mg via INTRAVENOUS
  Filled 2021-01-10: qty 50

## 2021-01-10 MED ORDER — LIDOCAINE HCL (PF) 1 % IJ SOLN
INTRAMUSCULAR | Status: DC | PRN
  Administered 2021-01-10: 2 mL via INTRADERMAL

## 2021-01-10 MED ORDER — DIPHENHYDRAMINE HCL 50 MG/ML IJ SOLN
INTRAMUSCULAR | Status: AC
  Filled 2021-01-10: qty 1

## 2021-01-10 MED ORDER — DIPHENHYDRAMINE HCL 50 MG/ML IJ SOLN
25.0000 mg | Freq: Once | INTRAMUSCULAR | Status: AC
  Administered 2021-01-10: 25 mg via INTRAVENOUS

## 2021-01-10 MED ORDER — SODIUM CHLORIDE 0.9% FLUSH: 3.0000 mL | Freq: Two times a day (BID) | INTRAVENOUS | Status: DC

## 2021-01-10 MED ORDER — METHYLPREDNISOLONE SODIUM SUCC 125 MG IJ SOLR
INTRAMUSCULAR | Status: AC
  Administered 2021-01-10: 125 mg
  Filled 2021-01-10: qty 2

## 2021-01-10 MED ORDER — DIPHENHYDRAMINE HCL 50 MG/ML IJ SOLN
INTRAMUSCULAR | Status: DC | PRN
  Administered 2021-01-10 (×2): 25 mg via INTRAVENOUS

## 2021-01-10 MED ORDER — LIDOCAINE HCL (PF) 1 % IJ SOLN
INTRAMUSCULAR | Status: AC
  Filled 2021-01-10: qty 30

## 2021-01-10 MED ORDER — HEPARIN (PORCINE) IN NACL 1000-0.9 UT/500ML-% IV SOLN
INTRAVENOUS | Status: AC
  Filled 2021-01-10: qty 500

## 2021-01-10 MED ORDER — SODIUM CHLORIDE 0.9 % IV SOLN: 250.0000 mL | INTRAVENOUS | Status: DC | PRN

## 2021-01-10 MED ORDER — SODIUM CHLORIDE 0.9% FLUSH: 3.0000 mL | INTRAVENOUS | Status: DC | PRN

## 2021-01-10 MED ORDER — IODIXANOL 320 MG/ML IV SOLN
INTRAVENOUS | Status: DC | PRN
  Administered 2021-01-10: 40 mL

## 2021-01-10 MED ORDER — ALBUTEROL SULFATE (2.5 MG/3ML) 0.083% IN NEBU
INHALATION_SOLUTION | RESPIRATORY_TRACT | Status: AC
  Administered 2021-01-10: 2.5 mg
  Filled 2021-01-10: qty 3

## 2021-01-10 MED ORDER — HEPARIN (PORCINE) IN NACL 1000-0.9 UT/500ML-% IV SOLN
INTRAVENOUS | Status: DC | PRN
  Administered 2021-01-10: 500 mL

## 2021-01-10 SURGICAL SUPPLY — 16 items
BAG SNAP BAND KOVER 36X36 (MISCELLANEOUS) ×2 IMPLANT
BALLN MUSTANG 5X80X75 (BALLOONS) ×2
BALLN MUSTANG 7X80X75 (BALLOONS) ×2
BALLOON MUSTANG 5X80X75 (BALLOONS) ×1 IMPLANT
BALLOON MUSTANG 7X80X75 (BALLOONS) IMPLANT
COVER DOME SNAP 22 D (MISCELLANEOUS) ×2 IMPLANT
KIT ENCORE 26 ADVANTAGE (KITS) ×1 IMPLANT
PROTECTION STATION PRESSURIZED (MISCELLANEOUS) ×2
SHEATH GLIDE SLENDER 4/5FR (SHEATH) ×1 IMPLANT
SHEATH PINNACLE R/O II 5F 6CM (SHEATH) ×2 IMPLANT
SHEATH PROBE COVER 6X72 (BAG) ×1 IMPLANT
STATION PROTECTION PRESSURIZED (MISCELLANEOUS) ×1 IMPLANT
STOPCOCK MORSE 400PSI 3WAY (MISCELLANEOUS) ×2 IMPLANT
TRAY PV CATH (CUSTOM PROCEDURE TRAY) ×2 IMPLANT
TUBING CIL FLEX 10 FLL-RA (TUBING) ×2 IMPLANT
WIRE BENTSON .035X145CM (WIRE) ×1 IMPLANT

## 2021-01-10 NOTE — Progress Notes (Signed)
Client states "I feel pretty good now"  no hives noted; client able to eat crackers and drink without difficulty; Dr Donzetta Matters in and ok to d/c home

## 2021-01-10 NOTE — Interval H&P Note (Signed)
History and Physical Interval Note:  01/10/2021 10:40 AM  [name]  has presented today for surgery, with the diagnosis of end stage renal.  The various methods of treatment have been discussed with the patient and family. After consideration of risks, benefits and other options for treatment, the patient has consented to  Procedure(s): A/V FISTULAGRAM (Left) as a surgical intervention.  The patient's history has been reviewed, patient examined, no change in status, stable for surgery.  I have reviewed the patient's chart and labs.  Questions were answered to the patient's satisfaction.     Jon Anderson

## 2021-01-10 NOTE — Progress Notes (Addendum)
Client c/o trouble swallowing and more hives noted on neck; Dr Donzetta Matters in and orders noted

## 2021-01-10 NOTE — Progress Notes (Addendum)
Client received from cath lab via stretcher and report received from Nelda Severe, RT that client had hives left upper arm and benadryl 25 was given

## 2021-01-10 NOTE — Progress Notes (Addendum)
Client states feels some better and client can talk now; hives have decreased; client states throat still feels "scratchy"

## 2021-01-10 NOTE — Op Note (Signed)
**Note Jon-Identified via Obfuscation**     Patient name: Jon Anderson MRN: [id number] DOB: June 08, 1950 Sex: male  01/10/2021 Pre-operative Diagnosis: End-stage renal disease, malfunction left upper arm AV fistula Post-operative diagnosis:  Same Surgeon:  Eda Paschal. Donzetta Matters, MD Procedure Performed: 1.  Ultrasound-guided cannulation left arm AV fistula 2.  Left upper extremity shuntogram 3.  Balloon assisted maturation left arm AV fistula with 5 and 7 mm balloons   Indications: 70 year old male currently on dialysis via catheter.  He has a previous two-stage basilic vein fistula placed in left upper arm which is failed to mature.  He is now indicated for fistulogram possible intervention.  Findings: The vein was undersized throughout the upper arm.  The arteriovenous anastomosis was widely patent.  We attempted balloon assisted maturation.  At completion there was an improved thrill.  We will have him follow-up in 3 to 4 weeks with repeat duplex.  If this fistula fails to mature in the next month we will plan for conversion to upper arm AV graft.   Procedure:  The patient was identified in the holding area and taken to room 8.  The patient was then placed supine on the table and prepped and draped in the usual sterile fashion.  A time out was called.  Ultrasound was used to evaluate the left arm AV fistula.  There is no spasm percent lidocaine cannulated with direct ultrasound visualization with a 5 French slender needle, wire and sheath.  Fistulogram was performed with the above findings including retrograde images to the artery.  The anastomosis was widely patent.  We then performed balloon assisted maturation with 5 and 7 mm balloons.  Completion demonstrated flow through the fistula that was improved there was a much stronger thrill in the fistula that was palpable previously there was no palpable thrill.  We will have him follow-up with duplex in 3 to 4 weeks to consider allowing the fistula for use versus conversion to upper arm  AV graft.  Contrast: 40 cc   Syd Newsome C. Donzetta Matters, MD [facility] [place] Office: [phone] Pager: [phone]

## 2021-01-10 NOTE — Significant Event (Signed)
Rapid Response Event Note   Reason for Call :  "Throat swelling"  Initial Focused Assessment:  Per RN patient recently arrived post fistulogram.  He had some hives at the AV graft site and was given 50mg  Benadryl.  Per staff he developed additional hives on his back and neck.  Patient complained of "throat swelling".  He was given an additional 25mg  Benadryl and 125mg  solumedrol.  Upon my arrival he is a little sleepy but easily arousable and conversant.  He Lung sounds are clear through out.  No upper airway sounds.  Dr Donzetta Matters at bedside to assess patient  144/80  HR 68  RR 15  O2 sat 100% on 2L [place]   Interventions:  Total 75mg  Benadryl given IV 125 mg Solumedrol IV Albuterol neb  Per RN hives are improving.  Patient states he is feeling a little better.  Able to swallow ice/water without difficulty  Plan of Care:  RN to call if patient worsens   Event Summary:   MD Notified: Donzetta Matters Call Time: [place] Time: 9767 End Time: [place]  Raliegh Ip, RN

## 2021-01-10 NOTE — Progress Notes (Addendum)
Rapid response called and here, Dr Donzetta Matters in again and orders noted; client having difficulty talking due to states "feels like throat swelling"

## 2021-01-11 ENCOUNTER — Encounter (HOSPITAL_COMMUNITY): Payer: Self-pay | Admitting: Vascular Surgery

## 2021-01-14 ENCOUNTER — Telehealth: Payer: Self-pay | Admitting: Orthopedic Surgery

## 2021-01-14 NOTE — Telephone Encounter (Signed)
Pt states he has surgery a year ago and has bumped his foot this morning. It is bleeding where he had his toes amputated. Does he need to come in?   [facility] [phone]

## 2021-01-14 NOTE — Telephone Encounter (Signed)
I called pt and lm on vm needing more details. Is is bleeding and not stopping/ is it a large open are on foot, painful ambulation. I need some more information to see if I need to work the pt on Luke's sch today or if it can hold for Dr. Sharol Given on Monday or Tuesday.

## 2021-01-17 NOTE — Telephone Encounter (Signed)
I called and lm on vm again for pt to call the office in follow up for the call Friday. Can work in today or make appt for tomorrow. Never heard back from him and just following up on call.

## 2021-01-18 NOTE — Telephone Encounter (Signed)
Can you please reach out to pt again? I called Friday and yesterday he states that he had an injury to his foot and wanting to see if pt needs an appt. Thanks!

## 2021-01-20 ENCOUNTER — Encounter (HOSPITAL_COMMUNITY): Payer: Self-pay | Admitting: *Deleted

## 2021-01-24 ENCOUNTER — Other Ambulatory Visit (HOSPITAL_BASED_OUTPATIENT_CLINIC_OR_DEPARTMENT_OTHER): Payer: Self-pay | Admitting: Cardiovascular Disease

## 2021-01-24 DIAGNOSIS — I6523 Occlusion and stenosis of bilateral carotid arteries: Secondary | ICD-10-CM

## 2021-02-03 ENCOUNTER — Other Ambulatory Visit: Payer: Self-pay

## 2021-02-03 ENCOUNTER — Encounter (HOSPITAL_COMMUNITY): Payer: Self-pay | Admitting: *Deleted

## 2021-02-03 ENCOUNTER — Other Ambulatory Visit: Payer: BC Managed Care – PPO

## 2021-02-04 ENCOUNTER — Telehealth: Payer: Self-pay | Admitting: Podiatry

## 2021-02-04 ENCOUNTER — Other Ambulatory Visit: Payer: Self-pay | Admitting: *Deleted

## 2021-02-04 DIAGNOSIS — Z992 Dependence on renal dialysis: Secondary | ICD-10-CM

## 2021-02-04 DIAGNOSIS — N186 End stage renal disease: Secondary | ICD-10-CM

## 2021-02-04 NOTE — Telephone Encounter (Signed)
Late entry from 10.27.2022. Per [name] @ [facility] [employer] insurance no Jon Anderson is needed for the L5000 (toefiller insert) covered @ 80% no deductible.. ref # [id number]

## 2021-02-09 ENCOUNTER — Ambulatory Visit (INDEPENDENT_AMBULATORY_CARE_PROVIDER_SITE_OTHER): Payer: Medicare Other | Admitting: Physician Assistant

## 2021-02-09 ENCOUNTER — Ambulatory Visit (HOSPITAL_COMMUNITY)
Admission: RE | Admit: 2021-02-09 | Discharge: 2021-02-09 | Disposition: A | Discharge status: Home / Self Care | Payer: Medicare Other | Source: Ambulatory Visit | Attending: Vascular Surgery | Admitting: Vascular Surgery

## 2021-02-09 ENCOUNTER — Other Ambulatory Visit: Payer: Self-pay

## 2021-02-09 VITALS — BP 81/52 | HR 81 | Temp 97.1°F | Resp 20 | Ht 71.0 in | Wt 166.6 lb

## 2021-02-09 DIAGNOSIS — N186 End stage renal disease: Secondary | ICD-10-CM

## 2021-02-09 DIAGNOSIS — Z992 Dependence on renal dialysis: Secondary | ICD-10-CM

## 2021-02-09 NOTE — H&P (View-Only) (Signed)
POST OPERATIVE OFFICE NOTE    CC:  F/u for surgery  HPI:  This is a 70 y.o. male who is s/p creation of left arm brachial artery to basilic vein AV fistula on 11/09/2020 by Dr. Donzetta Matters.  He developed a hematoma and was taken back to the OR and underwent evacuation of hematoma left arm and thrombectomy of left brachial basilic fistula by Dr. Scot Dock.  This was not maturing so he was taken to the OR on 12/14/2020 and underwent revision of left arm basilic vein fistula with transposition and interposition cephalic vein graft by Dr. Donzetta Matters.  On 01/10/2021, he was taken to the [facility] lab and underwent balloon assisted maturation of left arm AVF.  He comes in today for u/s to evaluate fistula.    Pt states he does not have continuous pain in the left hand but only occasionally.  His motor and sensory are in tact.   The pt is on dialysis M/W/F at [facility] location.   Allergies  Allergen Reactions   Contrast Media [Iodinated Diagnostic Agents] Anaphylaxis    Throat swelling, hives, SOB   Kiwi Extract Itching, Swelling and Other (See Comments)    Lips and face swell- breathing not affected   Flexeril [Cyclobenzaprine]     Hands become flimsy, can not hold things   Tape Other (See Comments)    "Plastic" tape causes blisters!!    Current Outpatient Medications  Medication Sig Dispense Refill   aspirin EC 81 MG tablet Take 1 tablet (81 mg total) by mouth daily.     atorvastatin (LIPITOR) 40 MG tablet Take 1 tablet (40 mg total) by mouth daily at 6 PM. 30 tablet 5   calcitRIOL (ROCALTROL) 0.25 MCG capsule Take 0.25 mcg by mouth every Monday, Wednesday, and Friday.     calcium acetate (PHOSLO) 667 MG capsule Take 2 capsules (1,334 mg total) by mouth 2 (two) times daily with a meal. 180 capsule 0   clopidogrel (PLAVIX) 75 MG tablet Take 75 mg by mouth daily.     gabapentin (NEURONTIN) 100 MG capsule Take 100-200 mg by mouth See admin instructions. 100 mg in the morning, 200 mg in the evening     loperamide  (IMODIUM) 2 MG capsule Take 2 mg by mouth as needed for diarrhea or loose stools.     midodrine (PROAMATINE) 10 MG tablet Take 10 mg by mouth in the morning and at bedtime.     multivitamin (RENA-VIT) TABS tablet Take 1 tablet by mouth at bedtime. 30 tablet 0   ONETOUCH ULTRA test strip See admin instructions.     oxyCODONE-acetaminophen (PERCOCET) 5-325 MG tablet Take 1 tablet by mouth every 6 (six) hours as needed for moderate pain. 20 tablet 0   pantoprazole (PROTONIX) 40 MG tablet Take 40 mg by mouth daily.     polyethylene glycol (MIRALAX / GLYCOLAX) packet Take 17 g by mouth daily as needed for moderate constipation. 14 each 0   No current facility-administered medications for this visit.     ROS:  See HPI  Physical Exam:  Today's Vitals   02/09/21 1127  BP: (!) 81/52  Pulse: 81  Resp: 20  Temp: (!) 97.1 F (36.2 C)  TempSrc: Temporal  SpO2: 96%  Weight: 166 lb 9.6 oz (75.6 kg)  Height: 5\' 11"  (1.803 m)  PainSc: 0-No pain   Body mass index is 23.24 kg/m.   Incision:  healed nicely Extremities:   There is a palpable left radial pulse.  Motor and sensory are in tact.   The fistula is pulsatile   Dialysis Duplex on 02/09/2021: Stenosis in the fistula in the proximal upper arm with velocity of 604   Assessment/Plan:  This is a 70 y.o. male who is s/p:  revision of left arm basilic vein fistula with transposition and interposition cephalic vein graft by Dr. Donzetta Matters 12/14/2020.  On 01/10/2021, he was taken to the [facility] lab and underwent balloon assisted maturation of left arm AVF.  -the pt does not have evidence of steal. -the fistula has a stenosis in the upper arm proximally of 604cm/s.  Per Dr. Claretha Cooper op note, if fistula continues not maturing, he would need a left arm graft.  I also discussed this with him today and we will set up the pt for a left arm AV graft.   -discussed with pt that access does not last forever and will need intervention or even new access at some  point.    Leontine Locket, [facility] [phone]  Clinic MD:  Donzetta Matters

## 2021-02-09 NOTE — Progress Notes (Signed)
POST OPERATIVE OFFICE NOTE    CC:  F/u for surgery  HPI:  This is a 70 y.o. male who is s/p creation of left arm brachial artery to basilic vein AV fistula on 11/09/2020 by Dr. Donzetta Matters.  He developed a hematoma and was taken back to the OR and underwent evacuation of hematoma left arm and thrombectomy of left brachial basilic fistula by Dr. Scot Dock.  This was not maturing so he was taken to the OR on 12/14/2020 and underwent revision of left arm basilic vein fistula with transposition and interposition cephalic vein graft by Dr. Donzetta Matters.  On 01/10/2021, he was taken to the [facility] lab and underwent balloon assisted maturation of left arm AVF.  He comes in today for u/s to evaluate fistula.    Pt states he does not have continuous pain in the left hand but only occasionally.  His motor and sensory are in tact.   The pt is on dialysis M/W/F at [facility] location.   Allergies  Allergen Reactions   Contrast Media [Iodinated Diagnostic Agents] Anaphylaxis    Throat swelling, hives, SOB   Kiwi Extract Itching, Swelling and Other (See Comments)    Lips and face swell- breathing not affected   Flexeril [Cyclobenzaprine]     Hands become flimsy, can not hold things   Tape Other (See Comments)    "Plastic" tape causes blisters!!    Current Outpatient Medications  Medication Sig Dispense Refill   aspirin EC 81 MG tablet Take 1 tablet (81 mg total) by mouth daily.     atorvastatin (LIPITOR) 40 MG tablet Take 1 tablet (40 mg total) by mouth daily at 6 PM. 30 tablet 5   calcitRIOL (ROCALTROL) 0.25 MCG capsule Take 0.25 mcg by mouth every Monday, Wednesday, and Friday.     calcium acetate (PHOSLO) 667 MG capsule Take 2 capsules (1,334 mg total) by mouth 2 (two) times daily with a meal. 180 capsule 0   clopidogrel (PLAVIX) 75 MG tablet Take 75 mg by mouth daily.     gabapentin (NEURONTIN) 100 MG capsule Take 100-200 mg by mouth See admin instructions. 100 mg in the morning, 200 mg in the evening     loperamide  (IMODIUM) 2 MG capsule Take 2 mg by mouth as needed for diarrhea or loose stools.     midodrine (PROAMATINE) 10 MG tablet Take 10 mg by mouth in the morning and at bedtime.     multivitamin (RENA-VIT) TABS tablet Take 1 tablet by mouth at bedtime. 30 tablet 0   ONETOUCH ULTRA test strip See admin instructions.     oxyCODONE-acetaminophen (PERCOCET) 5-325 MG tablet Take 1 tablet by mouth every 6 (six) hours as needed for moderate pain. 20 tablet 0   pantoprazole (PROTONIX) 40 MG tablet Take 40 mg by mouth daily.     polyethylene glycol (MIRALAX / GLYCOLAX) packet Take 17 g by mouth daily as needed for moderate constipation. 14 each 0   No current facility-administered medications for this visit.     ROS:  See HPI  Physical Exam:  Today's Vitals   02/09/21 1127  BP: (!) 81/52  Pulse: 81  Resp: 20  Temp: (!) 97.1 F (36.2 C)  TempSrc: Temporal  SpO2: 96%  Weight: 166 lb 9.6 oz (75.6 kg)  Height: 5\' 11"  (1.803 m)  PainSc: 0-No pain   Body mass index is 23.24 kg/m.   Incision:  healed nicely Extremities:   There is a palpable left radial pulse.  Motor and sensory are in tact.   The fistula is pulsatile   Dialysis Duplex on 02/09/2021: Stenosis in the fistula in the proximal upper arm with velocity of 604   Assessment/Plan:  This is a 70 y.o. male who is s/p:  revision of left arm basilic vein fistula with transposition and interposition cephalic vein graft by Dr. Donzetta Matters 12/14/2020.  On 01/10/2021, he was taken to the [facility] lab and underwent balloon assisted maturation of left arm AVF.  -the pt does not have evidence of steal. -the fistula has a stenosis in the upper arm proximally of 604cm/s.  Per Dr. Claretha Cooper op note, if fistula continues not maturing, he would need a left arm graft.  I also discussed this with him today and we will set up the pt for a left arm AV graft.   -discussed with pt that access does not last forever and will need intervention or even new access at some  point.    Leontine Locket, [facility] [phone]  Clinic MD:  Donzetta Matters

## 2021-02-10 ENCOUNTER — Ambulatory Visit: Payer: Medicare Other | Attending: Internal Medicine

## 2021-02-10 ENCOUNTER — Other Ambulatory Visit (HOSPITAL_BASED_OUTPATIENT_CLINIC_OR_DEPARTMENT_OTHER): Payer: Self-pay

## 2021-02-10 ENCOUNTER — Encounter (HOSPITAL_COMMUNITY): Payer: Self-pay | Admitting: *Deleted

## 2021-02-10 DIAGNOSIS — Z23 Encounter for immunization: Secondary | ICD-10-CM

## 2021-02-10 MED ORDER — PFIZER COVID-19 VAC BIVALENT 30 MCG/0.3ML IM SUSP
INTRAMUSCULAR | 0 refills | Status: DC
  Filled 2021-02-10: qty 0.3, 1d supply, fill #0

## 2021-02-10 NOTE — Progress Notes (Signed)
   Covid-19 Vaccination Clinic  Name:  Jon Anderson    MRN: [id number] DOB: 1951-03-26  02/10/2021  Mr. Rainville was observed post Covid-19 immunization for 15 minutes without incident. He was provided with Vaccine Information Sheet and instruction to access the V-Safe system.   Mr. Pineda was instructed to call 911 with any severe reactions post vaccine: Difficulty breathing  Swelling of face and throat  A fast heartbeat  A bad rash all over body  Dizziness and weakness   Immunizations Administered     Name Date Dose VIS Date Route   Pfizer Covid-19 Vaccine Bivalent Booster 02/10/2021 11:10 AM 0.3 mL 12/08/2020 Intramuscular   Manufacturer: [place]   Lot: [id number]   [place]: [phone]

## 2021-02-13 ENCOUNTER — Encounter (HOSPITAL_COMMUNITY): Payer: Self-pay | Admitting: Vascular Surgery

## 2021-02-13 ENCOUNTER — Other Ambulatory Visit: Payer: Self-pay

## 2021-02-13 NOTE — Progress Notes (Signed)
PCP - Dr. Mellody Drown  Cardiologist - Dr. Alvester Chou  EP-Denies  Endocrine-Denies  Pulm-Denies  Chest x-ray - Denies  EKG - 08/03/20 (E)  Stress Test - Denies  ECHO - 02/23/18 (E)  Cardiac Cath - 04/27/2017 (E)  AICD-na PM-na LOOP-na  Dialysis- M-W-F  Sleep Study - Yes- Positive CPAP - Denies  LABS- 02/14/21: I-Stat-8  ASA- Continue Plavix- LD-11/2  ERAS- No  HA1C- 07/24/19 (E): 6.4 Fasting Blood Sugar - 99-178 Checks Blood Sugar __1___ time a day  Anesthesia- No  Pt denies having chest pain, sob, or fever during the pre-op phone call. All instructions explained to the pt, with a verbal understanding of the material including: as of today, stop taking all Aspirin (unless instructed by your doctor) and Other Aspirin containing products, Vitamins, Fish oils, and Herbal medications. Also stop all NSAIDS i.e. Advil, Ibuprofen, Motrin, Aleve, Anaprox, Naproxen, BC, Goody Powders, and all Supplements.   Check your blood sugar the morning of your surgery when you wake up and every 2 hours until you get to the Short Stay unit. If your blood sugar is less than 70 mg/dL, you will need to treat for low blood sugar: Do not take insulin. Treat a low blood sugar (less than 70 mg/dL) with  cup of clear juice (cranberry or apple), 4 glucose tablets, OR glucose gel. Recheck blood sugar in 15 minutes after treatment (to make sure it is greater than 70 mg/dL). If your blood sugar is not greater than 70 mg/dL on recheck, call [phone]  for further instructions. Report your blood sugar to the short stay nurse when you get to Short Stay.   If your CBG is greater than 220 mg/dL, inform the staff upon arrival to Short Stay.  Reviewed and Endorsed by [facility] Patient Education Committee, August 2015   Pt also instructed to wear a mask and social distance if he has to go out prior to his surgery. The opportunity to ask questions was provided.    Coronavirus Screening  Have you  experienced the following symptoms:  Cough yes/no: No Fever (>100.40F)  yes/no: No Runny nose yes/no: No Sore throat yes/no: No Difficulty breathing/shortness of breath  yes/no: No  Have you or a family member traveled in the last 14 days and where? yes/no: No   If the patient indicates "YES" to the above questions, their PAT will be rescheduled to limit the exposure to others and, the surgeon will be notified. THE PATIENT WILL NEED TO BE ASYMPTOMATIC FOR 14 DAYS.   If the patient is not experiencing any of these symptoms, the PAT nurse will instruct them to NOT bring anyone with them to their appointment since they may have these symptoms or traveled as well.   Please remind your patients and families that hospital visitation restrictions are in effect and the importance of the restrictions.

## 2021-02-15 ENCOUNTER — Ambulatory Visit (HOSPITAL_COMMUNITY)
Admission: RE | Admit: 2021-02-15 | Discharge: 2021-02-15 | Disposition: A | Discharge status: Home / Self Care | Payer: Medicare Other | Attending: Vascular Surgery | Admitting: Vascular Surgery

## 2021-02-15 ENCOUNTER — Encounter (HOSPITAL_COMMUNITY): Payer: Self-pay | Admitting: Vascular Surgery

## 2021-02-15 ENCOUNTER — Ambulatory Visit (HOSPITAL_COMMUNITY): Payer: Medicare Other | Admitting: Certified Registered Nurse Anesthetist

## 2021-02-15 ENCOUNTER — Other Ambulatory Visit: Payer: Self-pay

## 2021-02-15 ENCOUNTER — Encounter (HOSPITAL_COMMUNITY)
Admission: RE | Disposition: A | Discharge status: Home / Self Care | Payer: Self-pay | Source: Home / Self Care | Attending: Vascular Surgery

## 2021-02-15 DIAGNOSIS — N186 End stage renal disease: Secondary | ICD-10-CM | POA: Diagnosis not present

## 2021-02-15 DIAGNOSIS — Z87891 Personal history of nicotine dependence: Secondary | ICD-10-CM | POA: Diagnosis not present

## 2021-02-15 DIAGNOSIS — T82898A Other specified complication of vascular prosthetic devices, implants and grafts, initial encounter: Secondary | ICD-10-CM | POA: Insufficient documentation

## 2021-02-15 DIAGNOSIS — I340 Nonrheumatic mitral (valve) insufficiency: Secondary | ICD-10-CM | POA: Diagnosis not present

## 2021-02-15 DIAGNOSIS — I509 Heart failure, unspecified: Secondary | ICD-10-CM | POA: Diagnosis not present

## 2021-02-15 DIAGNOSIS — E1151 Type 2 diabetes mellitus with diabetic peripheral angiopathy without gangrene: Secondary | ICD-10-CM | POA: Diagnosis not present

## 2021-02-15 DIAGNOSIS — E1122 Type 2 diabetes mellitus with diabetic chronic kidney disease: Secondary | ICD-10-CM | POA: Insufficient documentation

## 2021-02-15 DIAGNOSIS — I132 Hypertensive heart and chronic kidney disease with heart failure and with stage 5 chronic kidney disease, or end stage renal disease: Secondary | ICD-10-CM | POA: Diagnosis not present

## 2021-02-15 DIAGNOSIS — Z992 Dependence on renal dialysis: Secondary | ICD-10-CM | POA: Diagnosis not present

## 2021-02-15 DIAGNOSIS — X58XXXA Exposure to other specified factors, initial encounter: Secondary | ICD-10-CM | POA: Diagnosis not present

## 2021-02-15 HISTORY — PX: AV FISTULA PLACEMENT: SHX1204

## 2021-02-15 LAB — GLUCOSE, CAPILLARY
Glucose-Capillary: 153 mg/dL — ABNORMAL HIGH (ref 70–99)
Glucose-Capillary: 143 mg/dL — ABNORMAL HIGH (ref 70–99)
Glucose-Capillary: 141 mg/dL — ABNORMAL HIGH (ref 70–99)

## 2021-02-15 LAB — POCT I-STAT, CHEM 8
BUN: 25 mg/dL — ABNORMAL HIGH (ref 8–23)
Calcium, Ion: 1.04 mmol/L — ABNORMAL LOW (ref 1.15–1.40)
Chloride: 97 mmol/L — ABNORMAL LOW (ref 98–111)
Creatinine, Ser: 9.1 mg/dL — ABNORMAL HIGH (ref 0.61–1.24)
Glucose, Bld: 137 mg/dL — ABNORMAL HIGH (ref 70–99)
HCT: 48 % (ref 39.0–52.0)
Hemoglobin: 16.3 g/dL (ref 13.0–17.0)
Potassium: 3.9 mmol/L (ref 3.5–5.1)
Sodium: 136 mmol/L (ref 135–145)
TCO2: 28 mmol/L (ref 22–32)

## 2021-02-15 SURGERY — INSERTION OF ARTERIOVENOUS (AV) GORE-TEX GRAFT ARM
Anesthesia: General | Laterality: Left

## 2021-02-15 MED ORDER — EPHEDRINE 5 MG/ML INJ
INTRAVENOUS | Status: AC
  Filled 2021-02-15: qty 5

## 2021-02-15 MED ORDER — LIDOCAINE 2% (20 MG/ML) 5 ML SYRINGE
INTRAMUSCULAR | Status: AC
  Filled 2021-02-15: qty 5

## 2021-02-15 MED ORDER — EPHEDRINE SULFATE-NACL 50-0.9 MG/10ML-% IV SOSY
PREFILLED_SYRINGE | INTRAVENOUS | Status: DC | PRN
  Administered 2021-02-15 (×2): 5 mg via INTRAVENOUS

## 2021-02-15 MED ORDER — PROPOFOL 10 MG/ML IV BOLUS
INTRAVENOUS | Status: DC | PRN
  Administered 2021-02-15: 110 mg via INTRAVENOUS

## 2021-02-15 MED ORDER — PROPOFOL 10 MG/ML IV BOLUS
INTRAVENOUS | Status: AC
  Filled 2021-02-15: qty 20

## 2021-02-15 MED ORDER — SODIUM CHLORIDE 0.9 % IV SOLN: INTRAVENOUS | Status: DC

## 2021-02-15 MED ORDER — FENTANYL CITRATE (PF) 100 MCG/2ML IJ SOLN
25.0000 ug | INTRAMUSCULAR | Status: DC | PRN
  Administered 2021-02-15: 50 ug via INTRAVENOUS
  Administered 2021-02-15 (×2): 25 ug via INTRAVENOUS

## 2021-02-15 MED ORDER — OXYCODONE-ACETAMINOPHEN 5-325 MG PO TABS: 1.0000 | ORAL_TABLET | Freq: Four times a day (QID) | ORAL | 0 refills | Status: DC | PRN

## 2021-02-15 MED ORDER — CHLORHEXIDINE GLUCONATE 0.12 % MT SOLN: 15.0000 mL | Freq: Once | OROMUCOSAL | Status: AC

## 2021-02-15 MED ORDER — ORAL CARE MOUTH RINSE: 15.0000 mL | Freq: Once | OROMUCOSAL | Status: AC

## 2021-02-15 MED ORDER — FENTANYL CITRATE (PF) 250 MCG/5ML IJ SOLN
INTRAMUSCULAR | Status: DC | PRN
  Administered 2021-02-15 (×3): 25 ug via INTRAVENOUS

## 2021-02-15 MED ORDER — ACETAMINOPHEN 10 MG/ML IV SOLN: 1000.0000 mg | Freq: Once | INTRAVENOUS | Status: DC | PRN

## 2021-02-15 MED ORDER — HEMOSTATIC AGENTS (NO CHARGE) OPTIME
TOPICAL | Status: DC | PRN
  Administered 2021-02-15: 1 via TOPICAL

## 2021-02-15 MED ORDER — CHLORHEXIDINE GLUCONATE 4 % EX LIQD: 60.0000 mL | Freq: Once | CUTANEOUS | Status: DC

## 2021-02-15 MED ORDER — ONDANSETRON HCL 4 MG/2ML IJ SOLN
INTRAMUSCULAR | Status: DC | PRN
  Administered 2021-02-15: 4 mg via INTRAVENOUS

## 2021-02-15 MED ORDER — DEXAMETHASONE SODIUM PHOSPHATE 10 MG/ML IJ SOLN
INTRAMUSCULAR | Status: AC
  Filled 2021-02-15: qty 1

## 2021-02-15 MED ORDER — ONDANSETRON HCL 4 MG/2ML IJ SOLN
INTRAMUSCULAR | Status: AC
  Filled 2021-02-15: qty 2

## 2021-02-15 MED ORDER — HEPARIN 6000 UNIT IRRIGATION SOLUTION
Status: AC
  Filled 2021-02-15: qty 500

## 2021-02-15 MED ORDER — FENTANYL CITRATE (PF) 250 MCG/5ML IJ SOLN
INTRAMUSCULAR | Status: AC
  Filled 2021-02-15: qty 5

## 2021-02-15 MED ORDER — MIDAZOLAM HCL 2 MG/2ML IJ SOLN
INTRAMUSCULAR | Status: AC
  Filled 2021-02-15: qty 2

## 2021-02-15 MED ORDER — OXYCODONE HCL 5 MG/5ML PO SOLN: 5.0000 mg | Freq: Once | ORAL | Status: AC | PRN

## 2021-02-15 MED ORDER — FENTANYL CITRATE (PF) 100 MCG/2ML IJ SOLN
INTRAMUSCULAR | Status: AC
  Filled 2021-02-15: qty 2

## 2021-02-15 MED ORDER — CEFAZOLIN SODIUM-DEXTROSE 2-4 GM/100ML-% IV SOLN
2.0000 g | INTRAVENOUS | Status: AC
  Administered 2021-02-15: 2 g via INTRAVENOUS
  Filled 2021-02-15: qty 100

## 2021-02-15 MED ORDER — LIDOCAINE-EPINEPHRINE (PF) 1 %-1:200000 IJ SOLN
INTRAMUSCULAR | Status: AC
  Filled 2021-02-15: qty 30

## 2021-02-15 MED ORDER — CHLORHEXIDINE GLUCONATE 0.12 % MT SOLN
OROMUCOSAL | Status: AC
  Administered 2021-02-15: 15 mL via OROMUCOSAL
  Filled 2021-02-15: qty 15

## 2021-02-15 MED ORDER — DEXAMETHASONE SODIUM PHOSPHATE 10 MG/ML IJ SOLN
INTRAMUSCULAR | Status: DC | PRN
  Administered 2021-02-15: 5 mg via INTRAVENOUS

## 2021-02-15 MED ORDER — LIDOCAINE 2% (20 MG/ML) 5 ML SYRINGE
INTRAMUSCULAR | Status: DC | PRN
  Administered 2021-02-15: 60 mg via INTRAVENOUS

## 2021-02-15 MED ORDER — OXYCODONE HCL 5 MG PO TABS
5.0000 mg | ORAL_TABLET | Freq: Once | ORAL | Status: AC | PRN
  Administered 2021-02-15: 5 mg via ORAL

## 2021-02-15 MED ORDER — HEPARIN 6000 UNIT IRRIGATION SOLUTION
Status: DC | PRN
  Administered 2021-02-15: 1

## 2021-02-15 MED ORDER — OXYCODONE HCL 5 MG PO TABS
ORAL_TABLET | ORAL | Status: AC
  Filled 2021-02-15: qty 1

## 2021-02-15 MED ORDER — ACETAMINOPHEN 500 MG PO TABS: 1000.0000 mg | ORAL_TABLET | Freq: Once | ORAL | Status: DC | PRN

## 2021-02-15 MED ORDER — 0.9 % SODIUM CHLORIDE (POUR BTL) OPTIME
TOPICAL | Status: DC | PRN
  Administered 2021-02-15: 1000 mL

## 2021-02-15 MED ORDER — ACETAMINOPHEN 160 MG/5ML PO SOLN: 1000.0000 mg | Freq: Once | ORAL | Status: DC | PRN

## 2021-02-15 SURGICAL SUPPLY — 38 items
ADH SKN CLS APL DERMABOND .7 (GAUZE/BANDAGES/DRESSINGS) ×1
ARMBAND PINK RESTRICT EXTREMIT (MISCELLANEOUS) ×2 IMPLANT
BAG COUNTER SPONGE SURGICOUNT (BAG) ×2 IMPLANT
BAG SPNG CNTER NS LX DISP (BAG) ×1
CANISTER SUCT 3000ML PPV (MISCELLANEOUS) ×2 IMPLANT
CLIP LIGATING EXTRA MED SLVR (CLIP) ×2 IMPLANT
CLIP LIGATING EXTRA SM BLUE (MISCELLANEOUS) ×2 IMPLANT
CLIP VESOCCLUDE MED 6/CT (CLIP) ×2 IMPLANT
CLIP VESOCCLUDE SM WIDE 6/CT (CLIP) ×2 IMPLANT
COVER PROBE W GEL 5X96 (DRAPES) ×1 IMPLANT
DERMABOND ADVANCED (GAUZE/BANDAGES/DRESSINGS) ×1
DERMABOND ADVANCED .7 DNX12 (GAUZE/BANDAGES/DRESSINGS) ×1 IMPLANT
ELECT REM PT RETURN 9FT ADLT (ELECTROSURGICAL) ×2
ELECTRODE REM PT RTRN 9FT ADLT (ELECTROSURGICAL) ×1 IMPLANT
GLOVE SURG ENC MOIS LTX SZ7.5 (GLOVE) ×2 IMPLANT
GLOVE SURG PR MICRO ENCORE 7.5 (GLOVE) ×1 IMPLANT
GOWN STRL REUS W/ TWL LRG LVL3 (GOWN DISPOSABLE) ×2 IMPLANT
GOWN STRL REUS W/ TWL XL LVL3 (GOWN DISPOSABLE) ×1 IMPLANT
GOWN STRL REUS W/TWL LRG LVL3 (GOWN DISPOSABLE) ×4
GOWN STRL REUS W/TWL XL LVL3 (GOWN DISPOSABLE) ×2
GRAFT GORETEX STRT 4-7X45 (Vascular Products) ×2 IMPLANT
HEMOSTAT SNOW SURGICEL 2X4 (HEMOSTASIS) IMPLANT
INSERT FOGARTY SM (MISCELLANEOUS) ×2 IMPLANT
KIT BASIN OR (CUSTOM PROCEDURE TRAY) ×2 IMPLANT
KIT TURNOVER KIT B (KITS) ×2 IMPLANT
NS IRRIG 1000ML POUR BTL (IV SOLUTION) ×2 IMPLANT
PACK CV ACCESS (CUSTOM PROCEDURE TRAY) ×2 IMPLANT
PAD ARMBOARD 7.5X6 YLW CONV (MISCELLANEOUS) ×4 IMPLANT
POWDER SURGICEL 3.0 GRAM (HEMOSTASIS) ×2 IMPLANT
SUT MNCRL AB 4-0 PS2 18 (SUTURE) ×4 IMPLANT
SUT PROLENE 6 0 BV (SUTURE) ×4 IMPLANT
SUT SILK 2 0 SH (SUTURE) IMPLANT
SUT VIC AB 3-0 SH 27 (SUTURE) ×4
SUT VIC AB 3-0 SH 27X BRD (SUTURE) ×2 IMPLANT
SYR TOOMEY 50ML (SYRINGE) IMPLANT
TOWEL GREEN STERILE (TOWEL DISPOSABLE) ×2 IMPLANT
UNDERPAD 30X36 HEAVY ABSORB (UNDERPADS AND DIAPERS) ×2 IMPLANT
WATER STERILE IRR 1000ML POUR (IV SOLUTION) ×2 IMPLANT

## 2021-02-15 NOTE — Interval H&P Note (Signed)
History and Physical Interval Note:  02/15/2021 7:25 AM  Jon Anderson  has presented today for surgery, with the diagnosis of ESRD.  The various methods of treatment have been discussed with the patient and family. After consideration of risks, benefits and other options for treatment, the patient has consented to  Procedure(s): INSERTION OF LEFT ARM ARTERIOVENOUS (AV) GORE-TEX GRAFT (Left) as a surgical intervention.  His case is delayed due to emergency and he has been updated and is agreeable with the plan.  The patient's history has been reviewed, patient examined, no change in status, stable for surgery.  I have reviewed the patient's chart and labs.  Questions were answered to the patient's satisfaction.     [name]

## 2021-02-15 NOTE — Op Note (Signed)
    Patient name: Jon Anderson MRN: [id number] DOB: Apr 14, 1950 Sex: male  02/15/2021 Pre-operative Diagnosis: esrd Post-operative diagnosis:  Same Surgeon:  Erlene Quan C. Donzetta Matters, MD Assistant: Leontine Locket, PA Procedure Performed: Left upper arm AV fistula revision with interposition AV graft.  Indications: 70 year old male with end-stage renal disease currently dialyzing via catheter.  He has multiple previous left upper extremity accesses most recently a basilic vein fistula that also required an interposition cephalic vein.  He is undergone balloon angioplasty but unfortunately this did not mature.  He is now indicated for graft placement.  An assistant was necessary to facilitate exposure expedite the case.  Findings: The basilic vein fistula was sclerotic throughout its course.  We are able to get back near the anastomosis to the artery and there was good inflow there.  Up in the axilla the vein was also patent.  We placed an interposition graft in between 7 and and in the axillary vein and also to the proximal part of the fistula.  At completion there was very strong thrill in the graft and a palpable radial artery pulse at the wrist.   Procedure:  The patient was identified in the holding area and taken to the operating was placed supine operative table and LMA anesthesia was induced.  He was sterilely prepped and draped in the left lower extremity usual fashion, antibiotics were administered and a timeout was called.  We began using ultrasound we identified where the fistula was patent proximally and distally.  We opened his previous axillary incision as well as his incision just above the antecubitum.  In the antecubitum we dissected down around the fistula back just distal to the arterial anastomosis.  There was very strong pulsatility there.  In the axilla we dissected down to the fistula it did have weak flow throughout it.  We dissected higher up into the axilla where the vein appeared  patent on ultrasound.  We marked this orientation placed a vessel loop around it.  We then tunneled a 4-7 mm graft.  We trimmed to size in the axilla.  We clamped the vein in the axilla and transected it.  We tied off the fistula 2-0 silk suture.  We spatulated the vein and flushed with heparinized saline and reclamped it.  We sewed the graft into and with 6-0 Prolene suture.  Upon completion we then flushed through the graft and clamped the graft.  Towards the arterial anastomosis we trimmed it off graft was actually at about the 7 mm diameter there.  We clamped the fistula proximally.  We spatulated.  I flushed the fistula with heparinized saline to remove all the blood and then tied it off and trimmed to size.  We then sewed the graft end-to-end with 6-0 Prolene suture.  Prior completion we allowed flushing all directions.  There was very strong flow in the graft and a palpable radial artery pulse at the wrist.  The wounds were irrigated and closed in layers with Vicryl and Monocryl.  Dermabond is placed at the skin level.  He was awakened from anesthesia having tolerated procedure well without immediate complication.  All counts were correct at completion.   EBL: 100cc  Jon Anderson C. Donzetta Matters, MD [facility] [place] Office: [phone] Pager: [phone]

## 2021-02-15 NOTE — Progress Notes (Signed)
Pacu Discharge Note  Patient instuctions were given to daugther Jon Anderson. Wound care, diet, pain, follow up care and how and whom to contact with concerns were discussed. Family aware someone needs to remain with patient overnight and concerns after receiving anesthesia and what to avoid and safety. Answered all questions and concerns.   Discharge paperwork has clear contact informations for surgeon and 24 hour RN line for concerns.   Discussed what concerns to look for including infection and signs/symptoms to look for.   IV was removed prior to discharge. Patient was brought to car with belongings.   Pt exits my care.

## 2021-02-15 NOTE — Discharge Instructions (Signed)
   [facility]  Discharge Instructions  AV Fistula or Graft Surgery for Dialysis Access  Please refer to the following instructions for your post-procedure care. Your surgeon or physician assistant will discuss any changes with you.  Activity  You may drive the day following your surgery, if you are comfortable and no longer taking prescription pain medication. Resume full activity as the soreness in your incision resolves.  Bathing/Showering  You may shower after you go home. Keep your incision dry for 48 hours. Do not soak in a bathtub, hot tub, or swim until the incision heals completely. You may not shower if you have a hemodialysis catheter.  Incision Care  Clean your incision with mild soap and water after 48 hours. Pat the area dry with a clean towel. You do not need a bandage unless otherwise instructed. Do not apply any ointments or creams to your incision. You may have skin glue on your incision. Do not peel it off. It will come off on its own in about one week. Your arm may swell a bit after surgery. To reduce swelling use pillows to elevate your arm so it is above your heart. Your doctor will tell you if you need to lightly wrap your arm with an ACE bandage.  Diet  Resume your normal diet. There are not special food restrictions following this procedure. In order to heal from your surgery, it is CRITICAL to get adequate nutrition. Your body requires vitamins, minerals, and protein. Vegetables are the best source of vitamins and minerals. Vegetables also provide the perfect balance of protein. Processed food has little nutritional value, so try to avoid this.  Medications  Resume taking all of your medications. If your incision is causing pain, you may take over-the counter pain relievers such as acetaminophen (Tylenol). If you were prescribed a stronger pain medication, please be aware these medications can cause nausea and constipation. Prevent  nausea by taking the medication with a snack or meal. Avoid constipation by drinking plenty of fluids and eating foods with high amount of fiber, such as fruits, vegetables, and grains.  Do not take Tylenol if you are taking prescription pain medications.  Follow up Your surgeon may want to see you in the office following your access surgery. If so, this will be arranged at the time of your surgery.  Please call us immediately for any of the following conditions:  Increased pain, redness, drainage (pus) from your incision site Fever of 101 degrees or higher Severe or worsening pain at your incision site Hand pain or numbness.  Reduce your risk of vascular disease:  Stop smoking. If you would like help, call [facility] at 1-800-QUIT-NOW 519 493 5105) or [place] at [place] your cholesterol Maintain a desired weight Control your diabetes Keep your blood pressure down  Dialysis  It will take several weeks to several months for your new dialysis access to be ready for use. Your surgeon will determine when it is okay to use it. Your nephrologist will continue to direct your dialysis. You can continue to use your Permcath until your new access is ready for use.   02/15/2021 Jon Anderson [id number] [birth year]  Surgeon(s): Waynetta Sandy, MD  Procedure(s): INSERTION OF LEFT ARM UPPER ARM ARTERIOVENOUS GORE-TEX GRAFT  x Do not stick fistula for 4 weeks    If you have any questions, please call the office at [phone].

## 2021-02-15 NOTE — Anesthesia Procedure Notes (Signed)
Procedure Name: LMA Insertion Date/Time: 02/15/2021 9:51 AM Performed by: Dorthea Cove, CRNA Pre-anesthesia Checklist: Patient identified, Emergency Drugs available, Suction available and Patient being monitored Patient Re-evaluated:Patient Re-evaluated prior to induction Oxygen Delivery Method: Circle System Utilized Preoxygenation: Pre-oxygenation with 100% oxygen Induction Type: IV induction Ventilation: Mask ventilation without difficulty LMA: LMA inserted LMA Size: 5.0 Number of attempts: 1 Airway Equipment and Method: Bite block Placement Confirmation: positive ETCO2 Tube secured with: Tape Dental Injury: Teeth and Oropharynx as per pre-operative assessment

## 2021-02-15 NOTE — Transfer of Care (Signed)
Immediate Anesthesia Transfer of Care Note  Patient: Jon Anderson  Procedure(s) Performed: INSERTION OF LEFT ARM ARTERIOVENOUS GORE-TEX GRAFT (Left)  Patient Location: PACU  Anesthesia Type:General  Level of Consciousness: drowsy, patient cooperative and responds to stimulation  Airway & Oxygen Therapy: Patient Spontanous Breathing and Patient connected to face mask oxygen  Post-op Assessment: Report given to RN, Post -op Vital signs reviewed and stable and Patient moving all extremities X 4  Post vital signs: Reviewed and stable  Last Vitals:  Vitals Value Taken Time  BP    Temp    Pulse 73 02/15/21 1128  Resp 14 02/15/21 1128  SpO2 100 % 02/15/21 1128  Vitals shown include unvalidated device data.  Last Pain:  Vitals:   02/15/21 0626  TempSrc:   PainSc: 0-No pain      Patients Stated Pain Goal: 0 (97/35/32 9924)  Complications: No notable events documented.

## 2021-02-15 NOTE — Anesthesia Preprocedure Evaluation (Signed)
Anesthesia Evaluation  Patient identified by MRN, date of birth, ID band Patient awake    Reviewed: Allergy & Precautions, NPO status , Patient's Chart, lab work & pertinent test results  History of Anesthesia Complications Negative for: history of anesthetic complications  Airway Mallampati: I  TM Distance: >3 FB Neck ROM: Full    Dental  (+) Edentulous Upper, Edentulous Lower   Pulmonary shortness of breath, sleep apnea , neg COPD, former smoker,    breath sounds clear to auscultation       Cardiovascular hypertension, Pt. on medications (-) angina+ CAD, + CABG, + Peripheral Vascular Disease and +CHF  (-) dysrhythmias  Rhythm:Regular  Left ventricle: The cavity size was normal. There was severe  concentric hypertrophy. Systolic function was normal. The  estimated ejection fraction was in the range of 60% to 65%. Wall  motion was normal; there were no regional wall motion  abnormalities. There was an increased relative contribution of  atrial contraction to ventricular filling. Doppler parameters are  consistent with abnormal left ventricular relaxation (grade 1  diastolic dysfunction). Doppler parameters are consistent with  high ventricular filling pressure.  - Aortic valve: Trileaflet; mildly thickened, mildly calcified  leaflets. Valve area (VTI): 1.99 cm^2. Valve area (Vmax): 2.12  cm^2. Valve area (Vmean): 2.02 cm^2.  - Mitral valve: Severely calcified annulus. Moderate diffuse  thickening and calcification of the anterior leaflet and  posterior leaflet. Mobility of the posterior leaflet was  restricted to the point of immobility. The findings are  consistent with moderate stenosis. There was mild regurgitation.  Mean gradient (D): 7 mm Hg. Valve area by continuity equation  (using LVOT flow): 1.11 cm^2.  - Left atrium: The atrium was mildly dilated.  - Pulmonary arteries: Systolic  pressure could not be accurately  estimated as IVC is not visualized.   ? Prox LAD lesion is 75% stenosed. -Focal lesion that was previously described. ? LIMA-LAD is widely patent and is normal in caliber. There is competitive flow. ? Otherwise minimal disease throughout. Nothing made greater than 40% in the ostial circumflex. ? The left ventricular systolic function is normal. The left ventricular ejection fraction is 50-55% by visual estimate. ? LV end diastolic pressure is low. - ~0-3 mmHg ? There is no aortic valve stenosis.   Angiographically no culprit lesion to explain the patient's symptoms.  He does have a significant LAD lesion which is a very focal lesion and easily stent pull, however there is a widely patent LIMA graft distally.  There is actually retrograde filling from the LIMA graft to the diagonal branch which would be the only branch jeopardized by the more upstream LAD lesion. Nothing to explain the patient's symptoms.  In fact, his LVEDP is very low after dialysis.  This would indicate that he was adequately dialyzed and argue against increased LVEDP causing microvascular ischemic symptoms.   At this point, I think the only choice is to continue with his low-dose beta-blocker and nitrate for what still is probably microvascular disease disease. At least we know that with his ongoing symptoms are not likely related to any flow-limiting microvascular lesions.  Anticipate that he can be discharged tomorrow after his bedrest is over and medications have been reassessed.      Neuro/Psych neg Seizures  Neuromuscular disease negative psych ROS   GI/Hepatic hiatal hernia, GERD  ,  Endo/Other  diabetes  Renal/GU ESRF and DialysisRenal diseaselastHD Mon Lab Results      Component  Value               Date                      CREATININE               9.10 (H)            02/15/2021           Lab Results      Component                Value               Date                       K                        3.9                 02/15/2021                Musculoskeletal  (+) Arthritis ,   Abdominal   Peds  Hematology  (+) Blood dyscrasia, , Lab Results      Component                Value               Date                      WBC                      6.5                 11/09/2020                HGB                      16.3                02/15/2021                HCT                      48.0                02/15/2021                MCV                      98.4                11/09/2020                PLT                      179                 11/09/2020              Anesthesia Other Findings   Reproductive/Obstetrics                            Anesthesia Physical Anesthesia Plan  ASA: 3  Anesthesia Plan: General   Post-op Pain Management:    Induction: Intravenous  PONV Risk Score  and Plan: 2 and Ondansetron and Dexamethasone  Airway Management Planned: LMA  Additional Equipment: None  Intra-op Plan:   Post-operative Plan: Extubation in OR  Informed Consent: I have reviewed the patients History and Physical, chart, labs and discussed the procedure including the risks, benefits and alternatives for the proposed anesthesia with the patient or authorized representative who has indicated his/her understanding and acceptance.     Dental advisory given  Plan Discussed with: Anesthesiologist and CRNA  Anesthesia Plan Comments:        Anesthesia Quick Evaluation

## 2021-02-16 ENCOUNTER — Encounter (HOSPITAL_COMMUNITY): Payer: Self-pay | Admitting: Vascular Surgery

## 2021-02-18 ENCOUNTER — Other Ambulatory Visit: Payer: BC Managed Care – PPO

## 2021-02-18 NOTE — Anesthesia Postprocedure Evaluation (Signed)
Anesthesia Post Note  Patient: Jon Anderson  Procedure(s) Performed: INSERTION OF LEFT ARM ARTERIOVENOUS GORE-TEX GRAFT (Left)     Patient location during evaluation: PACU Anesthesia Type: General Level of consciousness: patient cooperative and awake Pain management: pain level controlled Vital Signs Assessment: post-procedure vital signs reviewed and stable Respiratory status: spontaneous breathing, nonlabored ventilation, respiratory function stable and patient connected to nasal cannula oxygen Cardiovascular status: blood pressure returned to baseline and stable Postop Assessment: no apparent nausea or vomiting Anesthetic complications: no   No notable events documented.  Last Vitals:  Vitals:   02/15/21 1235 02/15/21 1245  BP:  135/72  Pulse: 74 75  Resp: 15 12  Temp:  (!) 36.4 C  SpO2: 94% 92%    Last Pain:  Vitals:   02/15/21 1245  TempSrc:   PainSc: 3                  Darey Hershberger

## 2021-03-01 ENCOUNTER — Ambulatory Visit (INDEPENDENT_AMBULATORY_CARE_PROVIDER_SITE_OTHER): Payer: Medicare Other | Admitting: Physician Assistant

## 2021-03-01 ENCOUNTER — Other Ambulatory Visit: Payer: Self-pay

## 2021-03-01 VITALS — BP 76/52 | HR 91 | Temp 97.6°F | Resp 20 | Ht 71.0 in | Wt 167.0 lb

## 2021-03-01 DIAGNOSIS — N186 End stage renal disease: Secondary | ICD-10-CM

## 2021-03-01 DIAGNOSIS — Z992 Dependence on renal dialysis: Secondary | ICD-10-CM

## 2021-03-01 NOTE — Progress Notes (Signed)
POST OPERATIVE OFFICE NOTE    CC:  F/u for surgery  HPI:  This is a 70 y.o. male who is s/p Left upper arm AV fistula revision with interposition AV graft  on 02/15/21 by Dr. Donzetta Matters.    Pt returns today for follow up.  Pt states he has a little post upper arm numbness and mild edema.  He denise hand pain, loss of motor and loss of sensation.  He is on HD MWF via [place].  Allergies  Allergen Reactions   Contrast Media [Iodinated Diagnostic Agents] Anaphylaxis    Throat swelling, hives, SOB   Kiwi Extract Itching, Swelling and Other (See Comments)    Lips and face swell- breathing not affected   Flexeril [Cyclobenzaprine]     Hands become flimsy, can not hold things   Tape Other (See Comments)    "Plastic" tape causes blisters!!    Current Outpatient Medications  Medication Sig Dispense Refill   acetaminophen (TYLENOL) 325 MG tablet Take 650 mg by mouth every 6 (six) hours as needed for mild pain.     aspirin EC 81 MG tablet Take 1 tablet (81 mg total) by mouth daily.     atorvastatin (LIPITOR) 40 MG tablet Take 1 tablet (40 mg total) by mouth daily at 6 PM. 30 tablet 5   calcitRIOL (ROCALTROL) 0.25 MCG capsule Take 0.25 mcg by mouth every Monday, Wednesday, and Friday.     calcium acetate (PHOSLO) 667 MG capsule Take 2 capsules (1,334 mg total) by mouth 2 (two) times daily with a meal. 180 capsule 0   clopidogrel (PLAVIX) 75 MG tablet Take 75 mg by mouth daily.     COVID-19 mRNA bivalent vaccine, Pfizer, (PFIZER COVID-19 VAC BIVALENT) injection Inject into the muscle. 0.3 mL 0   docusate sodium (COLACE) 100 MG capsule Take 100 mg by mouth daily as needed for moderate constipation.     gabapentin (NEURONTIN) 100 MG capsule Take 100-200 mg by mouth See admin instructions. 100 mg in the morning, 200 mg in the evening     loperamide (IMODIUM) 2 MG capsule Take 2 mg by mouth as needed for diarrhea or loose stools.     midodrine (PROAMATINE) 10 MG tablet Take 10 mg by mouth in the morning and  at bedtime.     multivitamin (RENA-VIT) TABS tablet Take 1 tablet by mouth at bedtime. 30 tablet 0   ONETOUCH ULTRA test strip See admin instructions.     oxyCODONE-acetaminophen (PERCOCET) 5-325 MG tablet Take 1 tablet by mouth every 6 (six) hours as needed for moderate pain. 20 tablet 0   pantoprazole (PROTONIX) 40 MG tablet Take 40 mg by mouth daily.     polyethylene glycol (MIRALAX / GLYCOLAX) packet Take 17 g by mouth daily as needed for moderate constipation. 14 each 0   No current facility-administered medications for this visit.     ROS:  See HPI  Physical Exam:    Incision:  well healed incisions Extremities:  palpable radial pulse audible thrill in hybrid fistula, grip 5/5, sensation in the hand intact equal B UE. Lungs: non labored breathing Heart:  RRR   Assessment/Plan:  This is a 70 y.o. male who is s/p:Left upper arm AV fistula revision with interposition AV graft. The hybrid graft/fistula is working well we want to wait until after the first of the year before accessing the fistula to allow scar tissue to form around the graft portion of the fistula.  He will f/u PRN.  Roxy Horseman PA-C [facility] [phone]   Clinic MD:  Stanford Breed

## 2021-03-22 ENCOUNTER — Ambulatory Visit: Payer: Medicare Other

## 2021-03-22 ENCOUNTER — Other Ambulatory Visit: Payer: Self-pay

## 2021-03-22 DIAGNOSIS — L84 Corns and callosities: Secondary | ICD-10-CM

## 2021-03-22 DIAGNOSIS — Z89431 Acquired absence of right foot: Secondary | ICD-10-CM

## 2021-03-22 DIAGNOSIS — E1151 Type 2 diabetes mellitus with diabetic peripheral angiopathy without gangrene: Secondary | ICD-10-CM

## 2021-03-22 DIAGNOSIS — Z89421 Acquired absence of other right toe(s): Secondary | ICD-10-CM

## 2021-03-22 DIAGNOSIS — Z89411 Acquired absence of right great toe: Secondary | ICD-10-CM

## 2021-03-22 NOTE — Progress Notes (Signed)
SITUATION Reason for Visit: Fitting of Diabetic Shoes & Insoles Patient / Caregiver Report:  Patient is satisfied with shoes  OBJECTIVE DATA: Patient History / Diagnosis:  Type II diabetes mellitus with peripheral circulatory disorder (HCC)  S/P transmetatarsal amputation of foot, right (HCC)  Change in Status:   None  ACTIONS PERFORMED: In-Person Delivery, patient was fit with: - 1x pair A5500 PDAC approved prefabricated Diabetic Shoes: Apex 1270M Men - 3x Left O3500 PDAC approved CAM milled custom diabetic insoles - 1x Right L5000 PDAC approved partial foot prosthesis  Shoes and insoles were verified for structural integrity and safety. Patient wore shoes and insoles in office. Skin was inspected and free of areas of concern after wearing shoes and inserts. Shoes and inserts fit properly. Patient / Caregiver provided with ferbal instruction and demonstration regarding donning, doffing, wear, care, proper fit, function, purpose, cleaning, and use of shoes and insoles ' and in all related precautions and risks and benefits regarding shoes and insoles. Patient / Caregiver was instructed to wear properly fitting socks with shoes at all times. Patient was also provided with verbal instruction regarding how to report any failures or malfunctions of shoes or inserts, and necessary follow up care. Patient / Caregiver was also instructed to contact physician regarding change in status that may affect function of shoes and inserts.   Patient / Caregiver verbalized undersatnding of instruction provided. Patient / Caregiver demonstrated independence with proper donning and doffing of shoes and inserts.  PLAN Patient to follow up as needed. Plan of care was discussed with and agreed upon by patient and/or caregiver. All questions were answered and concerns addressed.

## 2021-04-22 ENCOUNTER — Ambulatory Visit: Payer: BC Managed Care – PPO | Admitting: Podiatry

## 2021-04-28 ENCOUNTER — Other Ambulatory Visit (HOSPITAL_COMMUNITY): Payer: Self-pay | Admitting: Cardiovascular Disease

## 2021-04-28 DIAGNOSIS — I739 Peripheral vascular disease, unspecified: Secondary | ICD-10-CM

## 2021-04-28 DIAGNOSIS — Z95820 Peripheral vascular angioplasty status with implants and grafts: Secondary | ICD-10-CM

## 2021-04-28 DIAGNOSIS — Z9862 Peripheral vascular angioplasty status: Secondary | ICD-10-CM

## 2021-05-03 ENCOUNTER — Other Ambulatory Visit (HOSPITAL_COMMUNITY): Payer: Self-pay | Admitting: Cardiovascular Disease

## 2021-05-03 ENCOUNTER — Ambulatory Visit (HOSPITAL_COMMUNITY)
Admission: RE | Admit: 2021-05-03 | Discharge: 2021-05-03 | Disposition: A | Discharge status: Home / Self Care | Payer: Medicare Other | Source: Ambulatory Visit | Attending: Cardiology | Admitting: Cardiology

## 2021-05-03 ENCOUNTER — Other Ambulatory Visit: Payer: Self-pay

## 2021-05-03 DIAGNOSIS — I739 Peripheral vascular disease, unspecified: Secondary | ICD-10-CM

## 2021-05-03 DIAGNOSIS — Z9862 Peripheral vascular angioplasty status: Secondary | ICD-10-CM

## 2021-05-03 DIAGNOSIS — Z95820 Peripheral vascular angioplasty status with implants and grafts: Secondary | ICD-10-CM | POA: Diagnosis not present

## 2021-05-16 ENCOUNTER — Other Ambulatory Visit: Payer: Self-pay

## 2021-05-16 DIAGNOSIS — N186 End stage renal disease: Secondary | ICD-10-CM

## 2021-05-16 DIAGNOSIS — Z992 Dependence on renal dialysis: Secondary | ICD-10-CM

## 2021-05-17 ENCOUNTER — Encounter: Payer: Self-pay | Admitting: Vascular Surgery

## 2021-05-17 ENCOUNTER — Ambulatory Visit (INDEPENDENT_AMBULATORY_CARE_PROVIDER_SITE_OTHER)
Admission: RE | Admit: 2021-05-17 | Discharge: 2021-05-17 | Disposition: A | Discharge status: Home / Self Care | Payer: Medicare Other | Source: Ambulatory Visit | Attending: Vascular Surgery | Admitting: Vascular Surgery

## 2021-05-17 ENCOUNTER — Other Ambulatory Visit: Payer: Self-pay

## 2021-05-17 ENCOUNTER — Ambulatory Visit (INDEPENDENT_AMBULATORY_CARE_PROVIDER_SITE_OTHER): Payer: Medicare Other | Admitting: Vascular Surgery

## 2021-05-17 ENCOUNTER — Ambulatory Visit (HOSPITAL_COMMUNITY)
Admission: RE | Admit: 2021-05-17 | Discharge: 2021-05-17 | Disposition: A | Discharge status: Home / Self Care | Payer: Medicare Other | Source: Ambulatory Visit | Attending: Vascular Surgery | Admitting: Vascular Surgery

## 2021-05-17 VITALS — BP 101/64 | HR 63 | Temp 98.0°F | Resp 18 | Ht 71.0 in | Wt 170.0 lb

## 2021-05-17 DIAGNOSIS — N186 End stage renal disease: Secondary | ICD-10-CM | POA: Insufficient documentation

## 2021-05-17 DIAGNOSIS — Z992 Dependence on renal dialysis: Secondary | ICD-10-CM

## 2021-05-17 NOTE — Progress Notes (Signed)
Patient name: Jon Anderson MRN: [id number] DOB: 29-Nov-1950 Sex: male  REASON FOR CONSULT: Evaluate for declot of left arm AV graft  HPI: Jon Anderson is a 71 y.o. male, with end-stage renal disease on dialysis Monday Wednesday Friday who is referred for evaluation of no thrill or bruit in left arm AV graft.  Referral was placed by Dr. Joelyn Oms on 04/20/2021.  Patient states they have been using his right IJ tunneled catheter since last year.  In review of the notes, he has somewhat of a complex history and initially had a left basilic vein fistula by Dr. Donzetta Matters on 11/09/2020.  This was ultimately converted to interposition cephalic vein and then most recently on 02/15/2021 he had an left upper extremity revision with AV graft.  He states that dialysis has never used the graft.     Past Medical History:  Diagnosis Date   Anemia of chronic disease    Arthritis    CAD (coronary artery disease)    a.  Myoview 4/11: EF 53%, no scar or ischemia   c. MV 2012 Nl perfusion, apical thinning.  No ischemia or scar.  EF 49%, appears greater by visual estimate.;  d.  Dob stress echo 12/13:  Negative Dob stress echo. There is no evidence of ischemia.  The LVF is normal. b. Normal cors 2016.   Carotid stenosis    a. <40% RICA, >98% LICA by duplex 04/1912   Chronic chest pain    occ   Constipation    chronic   Dyspnea    occasional with extertion   ESRD (end stage renal disease) on dialysis ([place])    M-W-F- [name]   GERD (gastroesophageal reflux disease)    History of hiatal hernia    History of kidney stones    Passed   HNP (herniated nucleus pulposus), lumbar    HTN (hypertension)    echo 3/10: EF 60%, LAE   Hyperlipidemia    Nephrolithiasis    "passed them all"   Peripheral arterial disease ([place])    a. s/p PTCA  right    Pneumonia yrs ago   Restless legs    Sciatic leg pain    Sleep apnea    does not use cpap   Snores    a. presumed OSA, pt has refused sleep eval in past.   Type  II diabetes mellitus ([place])    no longer on medications, checks blood glucose at home   Urinary frequency    Uses wheelchair    Walker as ambulation aid     Past Surgical History:  Procedure Laterality Date   A/V FISTULAGRAM Left 01/10/2021   Procedure: A/V FISTULAGRAM;  Surgeon: Waynetta Sandy, MD;  Location: [facility];  Service: Cardiovascular;  Laterality: Left;   ABDOMINAL AORTOGRAM W/LOWER EXTREMITY Bilateral 08/21/2019   Procedure: ABDOMINAL AORTOGRAM W/LOWER EXTREMITY;  Surgeon: Waynetta Sandy, MD;  Location: [place] [facility];  Service: Cardiovascular;  Laterality: Bilateral;   AMPUTATION Right 07/25/2019   Procedure: RIGHT 5th RAY AMPUTATION;  Surgeon: Newt Minion, MD;  Location: [facility];  Service: Orthopedics;  Laterality: Right;   AMPUTATION Right 11/19/2019   Procedure: RIGHT TRANSMETATARSAL AMPUTATION;  Surgeon: Newt Minion, MD;  Location: [facility];  Service: Orthopedics;  Laterality: Right;   ANGIOPLASTY / STENTING FEMORAL Left 12/11/2013   dr berry   AV FISTULA PLACEMENT Left 03/19/2014   Procedure: CREATION OF ARTERIOVENOUS (AV) FISTULA  LEFT UPPER ARM;  Surgeon: Mal Misty, MD;  Location: [facility] [place] [facility];  Service: Vascular;  Laterality: Left;   AV FISTULA PLACEMENT Left 11/09/2020   Procedure: LEFT ARM ARTERIOVENOUS (AV) FISTULA CREATION;  Surgeon: Waynetta Sandy, MD;  Location: [facility];  Service: Vascular;  Laterality: Left;   AV FISTULA PLACEMENT Left 02/15/2021   Procedure: INSERTION OF LEFT ARM ARTERIOVENOUS GORE-TEX GRAFT;  Surgeon: Waynetta Sandy, MD;  Location: [facility] [facility];  Service: Vascular;  Laterality: Left;   BACK SURGERY  01/2018   screws placed    [place] Left 12/14/2020   Procedure: REVISION OF LEFT ARM SECOND STAGE BASILIC VEIN TRANSPOSITION;  Surgeon: Waynetta Sandy, MD;  Location: [place];  Service: Vascular;  Laterality: Left;   CARDIAC CATHETERIZATION  2001 and 2010    COLONOSCOPY W/  BIOPSIES AND POLYPECTOMY     COLONOSCOPY WITH PROPOFOL N/A 08/01/2016   Procedure: COLONOSCOPY WITH PROPOFOL;  Surgeon: Carol Ada, MD;  Location: [facility];  Service: Endoscopy;  Laterality: N/A;   CORONARY ARTERY BYPASS GRAFT  2019   [facility] x [facility] bypass   ESOPHAGOGASTRODUODENOSCOPY (EGD) WITH PROPOFOL N/A 08/01/2016   Procedure: ESOPHAGOGASTRODUODENOSCOPY (EGD) WITH PROPOFOL;  Surgeon: Carol Ada, MD;  Location: [facility];  Service: Endoscopy;  Laterality: N/A;   FISTULA SUPERFICIALIZATION Left 02/10/2020   Procedure: 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;  Surgeon: Rosetta Posner, MD;  Location: [place];  Service: Vascular;  Laterality: Left;   FISTULA SUPERFICIALIZATION Left 03/23/2020   Procedure: LEFT UPPER EXTREMITY ARTERIOVENOUS FISTULA PLICATION;  Surgeon: Rosetta Posner, MD;  Location: [place];  Service: Vascular;  Laterality: Left;   FOOT FRACTURE SURGERY Right    ligament repair   FRACTURE SURGERY     left forearm   GRAFT APPLICATION Right 9/0/3009   Procedure: FAT GRAFT APPLICATION;  Surgeon: Evelina Bucy, DPM;  Location: [facility];  Service: Podiatry;  Laterality: Right;   HEMATOMA EVACUATION Left 11/09/2020   Procedure: EVACUATION HEMATOMA LEFT ARM;  Surgeon: Angelia Mould, MD;  Location: [facility];  Service: Vascular;  Laterality: Left;   INGUINAL HERNIA REPAIR Left    LEFT HEART CATH AND CORS/GRAFTS ANGIOGRAPHY N/A 04/27/2017   Procedure: LEFT HEART CATH AND CORS/GRAFTS ANGIOGRAPHY;  Surgeon: Leonie Man, MD;  Location: [place] [facility];  Service: Cardiovascular;  Laterality: N/A;   LEFT HEART CATHETERIZATION WITH CORONARY ANGIOGRAM N/A 06/22/2014   Procedure: LEFT HEART CATHETERIZATION WITH CORONARY ANGIOGRAM;  Surgeon: Troy Sine, MD;  Location: [facility];  Service: Cardiovascular;  Laterality: N/A;   LOWER EXTREMITY ANGIOGRAM Left 12/11/2013   Procedure: LOWER EXTREMITY ANGIOGRAM;  Surgeon: Lorretta Harp, MD;   Location: [facility] C[facility] LAB;  Service: Cardiovascular;  Laterality: Left;   LUMBAR LAMINECTOMY/DECOMPRESSION MICRODISCECTOMY Right 07/03/2017   Procedure: MICRODISCECTOMY LUMBAR FIVE - SACRAL ONE RIGHT;  Surgeon: Consuella Lose, MD;  Location: [place];  Service: Neurosurgery;  Laterality: Right;   LUMBAR LAMINECTOMY/DECOMPRESSION MICRODISCECTOMY Right 10/19/2017   Procedure: MICRODISCECTOMY LUMBAR FIVE- SACRAL 1 ONE ;  Surgeon: Consuella Lose, MD;  Location: [place];  Service: Neurosurgery;  Laterality: Right;   PERIPHERAL VASCULAR INTERVENTION Left 01/10/2021   Procedure: PERIPHERAL VASCULAR INTERVENTION;  Surgeon: Waynetta Sandy, MD;  Location: [place] [facility];  Service: Cardiovascular;  Laterality: Left;   THROMBECTOMY W/ EMBOLECTOMY Left 11/09/2020   Procedure: THROMBECTOMY OF LEFT ARTERIOVENOUS FISTULA;  Surgeon: Angelia Mould, MD;  Location: [place];  Service: Vascular;  Laterality: Left;   TONSILLECTOMY  AND ADENOIDECTOMY     WISDOM TOOTH EXTRACTION     WOUND DEBRIDEMENT Right 05/13/2019   Procedure: DEBRIDEMENT WOUND;  Surgeon: Evelina Bucy, DPM;  Location: [place];  Service: Podiatry;  Laterality: Right;    Family History  Problem Relation Age of Onset   Heart attack Sister        died @ 80   Cancer Mother        died @ 11; unknown type   Diabetes Brother        deceased   Cirrhosis Father        alcohol related   Diabetes Father    Esophageal cancer Neg Hx    Colon cancer Neg Hx    Pancreatic cancer Neg Hx    Stomach cancer Neg Hx     SOCIAL HISTORY: Social History   Socioeconomic History   Marital status: Married    Spouse name: Not on file   Number of children: Not on file   Years of education: Not on file   Highest education level: Not on file  Occupational History   Occupation: retired  Tobacco Use   Smoking status: Former    Packs/day: 1.00    Years: 2.00    Pack years: 2.00    Types: Cigarettes    Quit date: 1978     Years since quitting: 45.1   Smokeless tobacco: Never  Vaping Use   Vaping Use: Never used  Substance and Sexual Activity   Alcohol use: Not Currently    Alcohol/week: 0.0 standard drinks    Comment: h/o social drinking   Drug use: Not Currently    Types: Marijuana    Comment: quit 1986   Sexual activity: Yes    Birth control/protection: None  Other Topics Concern   Not on file  Social History Narrative   The patient is a Retail buyer.  He is married and has 2 grown children.  Lives in [place] with his wife.  Denies tobacco, alcohol or IV drug abuse or marijuana or cocaine intake.    Social Determinants of Health   Financial Resource Strain: Not on file  Food Insecurity: Not on file  Transportation Needs: Not on file  Physical Activity: Not on file  Stress: Not on file  Social Connections: Not on file  Intimate Partner Violence: Not on file    Allergies  Allergen Reactions   Contrast Media [Iodinated Contrast Media] Anaphylaxis    Throat swelling, hives, SOB   Kiwi Extract Itching, Swelling and Other (See Comments)    Lips and face swell- breathing not affected   Flexeril [Cyclobenzaprine]     Hands become flimsy, can not hold things   Tape Other (See Comments)    "Plastic" tape causes blisters!!    Current Outpatient Medications  Medication Sig Dispense Refill   acetaminophen (TYLENOL) 325 MG tablet Take 650 mg by mouth every 6 (six) hours as needed for mild pain.     aspirin EC 81 MG tablet Take 1 tablet (81 mg total) by mouth daily.     atorvastatin (LIPITOR) 40 MG tablet Take 1 tablet (40 mg total) by mouth daily at 6 PM. 30 tablet 5   calcitRIOL (ROCALTROL) 0.25 MCG capsule Take 0.25 mcg by mouth every Monday, Wednesday, and Friday.     calcium acetate (PHOSLO) 667 MG capsule Take 2 capsules (1,334 mg total) by mouth 2 (two) times daily with a meal. 180 capsule 0   clopidogrel (PLAVIX) 75 MG  tablet Take 75 mg by mouth daily.     COVID-19 mRNA bivalent vaccine, Pfizer,  (PFIZER COVID-19 VAC BIVALENT) injection Inject into the muscle. 0.3 mL 0   docusate sodium (COLACE) 100 MG capsule Take 100 mg by mouth daily as needed for moderate constipation.     gabapentin (NEURONTIN) 100 MG capsule Take 100-200 mg by mouth See admin instructions. 100 mg in the morning, 200 mg in the evening     loperamide (IMODIUM) 2 MG capsule Take 2 mg by mouth as needed for diarrhea or loose stools.     midodrine (PROAMATINE) 10 MG tablet Take 10 mg by mouth in the morning and at bedtime.     multivitamin (RENA-VIT) TABS tablet Take 1 tablet by mouth at bedtime. 30 tablet 0   ONETOUCH ULTRA test strip See admin instructions.     oxyCODONE-acetaminophen (PERCOCET) 5-325 MG tablet Take 1 tablet by mouth every 6 (six) hours as needed for moderate pain. 20 tablet 0   pantoprazole (PROTONIX) 40 MG tablet Take 40 mg by mouth daily.     polyethylene glycol (MIRALAX / GLYCOLAX) packet Take 17 g by mouth daily as needed for moderate constipation. 14 each 0   No current facility-administered medications for this visit.    REVIEW OF SYSTEMS:  [X]  denotes positive finding, [ ]  denotes negative finding Cardiac  Comments:  Chest pain or chest pressure:    Shortness of breath upon exertion:    Short of breath when lying flat:    Irregular heart rhythm:        Vascular    Pain in calf, thigh, or hip brought on by ambulation:    Pain in feet at night that wakes you up from your sleep:     Blood clot in your veins:    Leg swelling:         Pulmonary    Oxygen at home:    Productive cough:     Wheezing:         Neurologic    Sudden weakness in arms or legs:     Sudden numbness in arms or legs:     Sudden onset of difficulty speaking or slurred speech:    Temporary loss of vision in one eye:     Problems with dizziness:         Gastrointestinal    Blood in stool:     Vomited blood:         Genitourinary    Burning when urinating:     Blood in urine:        Psychiatric    Major  depression:         Hematologic    Bleeding problems:    Problems with blood clotting too easily:        Skin    Rashes or ulcers:        Constitutional    Fever or chills:      PHYSICAL EXAM: Vitals:   05/17/21 1455  BP: 101/64  Pulse: 63  Resp: 18  Temp: 98 F (36.7 C)  TempSrc: Temporal  SpO2: 96%  Weight: 170 lb (77.1 kg)  Height: 5\' 11"  (1.803 m)    GENERAL: The patient is a well-nourished male, in no acute distress. The vital signs are documented above. CARDIAC: There is a regular rate and rhythm.  VASCULAR:  Left arm AV graft with appreciable thrill on exam and weakly palpable radial pulse at the wrist Right IJ tunneled dialysis  catheter PULMONARY: No respiratory distress.. ABDOMEN: Soft and non-tender.  MUSCULOSKELETAL: There are no major deformities or cyanosis. NEUROLOGIC: No focal weakness or paresthesias are detected. SKIN: There are no ulcers or rashes noted. PSYCHIATRIC: The patient has a normal affect.  DATA:   Right arm vein mapping shows usable basilic vein  Assessment/Plan:  71 year old male with end-stage renal disease on hemodialysis Monday Wednesday Friday that presents for evaluation of left arm AV graft.  The referral states that there is no bruit or thrill and evaluate for declot.  That being said, on exam he does have an appreciable thrill that I can feel and he states they have not been able to use this since it was placed on 02/15/2021 by Dr. Donzetta Matters.  I have recommended a left upper extremity fistulogram and possible intervention to see if the graft could remain salvageable.  Discussed ultimately if this fails, may need to consider right arm access and he does appear to have a good basilic vein in the right arm.  We will get him scheduled for this Thursday in the Cath Lab.   Marty Heck, MD [facility] [place] Office: [phone]

## 2021-05-19 ENCOUNTER — Ambulatory Visit (HOSPITAL_COMMUNITY)
Admission: RE | Admit: 2021-05-19 | Discharge: 2021-05-19 | Disposition: A | Discharge status: Home / Self Care | Payer: Medicare Other | Attending: Vascular Surgery | Admitting: Vascular Surgery

## 2021-05-19 ENCOUNTER — Other Ambulatory Visit: Payer: Self-pay

## 2021-05-19 ENCOUNTER — Encounter (HOSPITAL_COMMUNITY)
Admission: RE | Disposition: A | Discharge status: Home / Self Care | Payer: Self-pay | Source: Home / Self Care | Attending: Vascular Surgery

## 2021-05-19 DIAGNOSIS — T82858A Stenosis of vascular prosthetic devices, implants and grafts, initial encounter: Secondary | ICD-10-CM | POA: Diagnosis present

## 2021-05-19 DIAGNOSIS — E1122 Type 2 diabetes mellitus with diabetic chronic kidney disease: Secondary | ICD-10-CM | POA: Diagnosis not present

## 2021-05-19 DIAGNOSIS — Z87891 Personal history of nicotine dependence: Secondary | ICD-10-CM | POA: Diagnosis not present

## 2021-05-19 DIAGNOSIS — Z992 Dependence on renal dialysis: Secondary | ICD-10-CM | POA: Insufficient documentation

## 2021-05-19 DIAGNOSIS — I120 Hypertensive chronic kidney disease with stage 5 chronic kidney disease or end stage renal disease: Secondary | ICD-10-CM | POA: Diagnosis not present

## 2021-05-19 DIAGNOSIS — Y841 Kidney dialysis as the cause of abnormal reaction of the patient, or of later complication, without mention of misadventure at the time of the procedure: Secondary | ICD-10-CM | POA: Insufficient documentation

## 2021-05-19 DIAGNOSIS — N186 End stage renal disease: Secondary | ICD-10-CM | POA: Insufficient documentation

## 2021-05-19 HISTORY — PX: A/V FISTULAGRAM: CATH118298

## 2021-05-19 HISTORY — PX: PERIPHERAL VASCULAR BALLOON ANGIOPLASTY: CATH118281

## 2021-05-19 LAB — POCT I-STAT, CHEM 8
BUN: 27 mg/dL — ABNORMAL HIGH (ref 8–23)
Calcium, Ion: 1 mmol/L — ABNORMAL LOW (ref 1.15–1.40)
Chloride: 96 mmol/L — ABNORMAL LOW (ref 98–111)
Creatinine, Ser: 8.9 mg/dL — ABNORMAL HIGH (ref 0.61–1.24)
Glucose, Bld: 102 mg/dL — ABNORMAL HIGH (ref 70–99)
HCT: 46 % (ref 39.0–52.0)
Hemoglobin: 15.6 g/dL (ref 13.0–17.0)
Potassium: 4.7 mmol/L (ref 3.5–5.1)
Sodium: 137 mmol/L (ref 135–145)
TCO2: 30 mmol/L (ref 22–32)

## 2021-05-19 LAB — GLUCOSE, CAPILLARY: Glucose-Capillary: 119 mg/dL — ABNORMAL HIGH (ref 70–99)

## 2021-05-19 SURGERY — A/V FISTULAGRAM
Anesthesia: LOCAL | Laterality: Left

## 2021-05-19 MED ORDER — HEPARIN (PORCINE) IN NACL 1000-0.9 UT/500ML-% IV SOLN
INTRAVENOUS | Status: AC
  Filled 2021-05-19: qty 500

## 2021-05-19 MED ORDER — DIPHENHYDRAMINE HCL 50 MG/ML IJ SOLN
INTRAMUSCULAR | Status: AC
  Administered 2021-05-19: 25 mg via INTRAVENOUS
  Filled 2021-05-19: qty 1

## 2021-05-19 MED ORDER — SODIUM CHLORIDE 0.9% FLUSH: 3.0000 mL | Freq: Two times a day (BID) | INTRAVENOUS | Status: DC

## 2021-05-19 MED ORDER — DIPHENHYDRAMINE HCL 50 MG/ML IJ SOLN: 25.0000 mg | Freq: Once | INTRAMUSCULAR | Status: AC

## 2021-05-19 MED ORDER — IODIXANOL 320 MG/ML IV SOLN
INTRAVENOUS | Status: DC | PRN
  Administered 2021-05-19: 35 mL

## 2021-05-19 MED ORDER — DIPHENHYDRAMINE HCL 50 MG/ML IJ SOLN: 12.5000 mg | Freq: Once | INTRAMUSCULAR | Status: DC

## 2021-05-19 MED ORDER — LIDOCAINE HCL (PF) 1 % IJ SOLN
INTRAMUSCULAR | Status: DC | PRN
  Administered 2021-05-19: 2 mL

## 2021-05-19 MED ORDER — METHYLPREDNISOLONE SODIUM SUCC 125 MG IJ SOLR
INTRAMUSCULAR | Status: AC
  Administered 2021-05-19: 125 mg via INTRAVENOUS
  Filled 2021-05-19: qty 2

## 2021-05-19 MED ORDER — SODIUM CHLORIDE 0.9% FLUSH: 3.0000 mL | INTRAVENOUS | Status: DC | PRN

## 2021-05-19 MED ORDER — HEPARIN (PORCINE) IN NACL 1000-0.9 UT/500ML-% IV SOLN
INTRAVENOUS | Status: DC | PRN
  Administered 2021-05-19: 500 mL

## 2021-05-19 MED ORDER — METHYLPREDNISOLONE SODIUM SUCC 125 MG IJ SOLR: 125.0000 mg | Freq: Once | INTRAMUSCULAR | Status: AC

## 2021-05-19 MED ORDER — HEPARIN SODIUM (PORCINE) 1000 UNIT/ML IJ SOLN
INTRAMUSCULAR | Status: AC
  Filled 2021-05-19: qty 10

## 2021-05-19 MED ORDER — HEPARIN SODIUM (PORCINE) 1000 UNIT/ML IJ SOLN
INTRAMUSCULAR | Status: DC | PRN
  Administered 2021-05-19: 3000 [IU] via INTRAVENOUS

## 2021-05-19 MED ORDER — SODIUM CHLORIDE 0.9 % IV SOLN: 250.0000 mL | INTRAVENOUS | Status: DC | PRN

## 2021-05-19 MED ORDER — LIDOCAINE HCL (PF) 1 % IJ SOLN
INTRAMUSCULAR | Status: AC
  Filled 2021-05-19: qty 30

## 2021-05-19 SURGICAL SUPPLY — 16 items
BAG SNAP BAND KOVER 36X36 (MISCELLANEOUS) ×2 IMPLANT
BALLN MUSTANG 6.0X40 75 (BALLOONS) ×2
BALLN MUSTANG 7.0X40 75 (BALLOONS) ×2
BALLOON MUSTANG 6.0X40 75 (BALLOONS) IMPLANT
BALLOON MUSTANG 7.0X40 75 (BALLOONS) IMPLANT
COVER DOME SNAP 22 D (MISCELLANEOUS) ×2 IMPLANT
KIT ENCORE 26 ADVANTAGE (KITS) ×1 IMPLANT
KIT MICROPUNCTURE NIT STIFF (SHEATH) ×1 IMPLANT
PROTECTION STATION PRESSURIZED (MISCELLANEOUS) ×2
SHEATH PINNACLE R/O II 6F 4CM (SHEATH) ×1 IMPLANT
SHEATH PROBE COVER 6X72 (BAG) ×2 IMPLANT
STATION PROTECTION PRESSURIZED (MISCELLANEOUS) ×1 IMPLANT
STOPCOCK MORSE 400PSI 3WAY (MISCELLANEOUS) ×2 IMPLANT
TRAY PV CATH (CUSTOM PROCEDURE TRAY) ×2 IMPLANT
TUBING CIL FLEX 10 FLL-RA (TUBING) ×2 IMPLANT
WIRE BENTSON .035X145CM (WIRE) ×1 IMPLANT

## 2021-05-19 NOTE — Op Note (Signed)
OPERATIVE NOTE   PROCEDURE: left upper extremity arteriovenous graft cannulation under ultrasound guidance left arm fistulogram including central venogram left peripheral angioplasty of distal upper arm graft near arterial anastomosis and arterial anastmosis with 6 mm x 40 mm Mustang and 7 mm x 40 mm Mustang   PRE-OPERATIVE DIAGNOSIS: Malfunctioning left arm arteriovenous graft  POST-OPERATIVE DIAGNOSIS: same as above   SURGEON: Marty Heck, MD  ANESTHESIA: local  ESTIMATED BLOOD LOSS: 5 cc  FINDING(S): The left upper arm graft had no evidence of stenosis at the venous anastomosis or any evidence of central venous stenosis.  There was a stenotic segment adjacent to the arterial anastomosis where a previous segment of vein from an old fistula was used to incorporate the new graft interposition.  The fistula was reaccessed retrograde and then angioplastied with a 6 mm x 40 mm and 7 mm x 40 mm Mustang at the arterial anastomosis with excellent results and no residual stenosis.  Much better thrill.  Palpable radial pulse.    Can use the graft immediately.  If ongoing issues I think we should evaluate for contralateral right arm access or a new left upper arm graft.  SPECIMEN(S):  None  CONTRAST: 35 mL  INDICATIONS: Jon Anderson is a 71 y.o. male who  presents with malfunctioning left upper arm arteriovenous graft.  The patient is scheduled for left arm fistulogram.  The patient is aware the risks include but are not limited to: bleeding, infection, thrombosis of the cannulated access, and possible anaphylactic reaction to the contrast.  The patient is aware of the risks of the procedure and elects to proceed forward.  DESCRIPTION: After full informed written consent was obtained, the patient was brought back to the angiography suite and placed supine upon the angiography table.  The patient was connected to monitoring equipment.  The left arm was prepped and draped in  the standard fashion for a left arm fistulogram.  Under ultrasound guidance, the left arteriovenous graft was evaluated, it was patent, an image was saved.  It was cannulated with a micropuncture needle.  The microwire was advanced into the fistula and the needle was exchanged for the a microsheath, which was lodged 2 cm into the access.  The wire was removed and the sheath was connected to the IV extension tubing.  Hand injections were completed to image the access from the antecubitum up to the level of axilla.  The central venous structures were also imaged by hand injections.  A retrograde image was also obtained by compressing the graft.  Ultimately after evaluating images appear to have a high-grade stenosis adjacent to the arterial anastomosis.  I then removed the sheath with a 4-0 Monocryl pursestring in the fistula.  We then went higher up on the arm and again evaluated the graft with ultrasound where it was patent and this was accessed retrograde toward the arterial anastomosis with a micro needle, placed a microwire, and then a microsheath.  I advanced a Bentson wire across the arterial anastomosis and placed a short 6 [place] sheath.  Patient was given 3000 units of IV heparin.  We then angioplastied this with a 6 mm x 40 mm Mustang and then a 7 mm x 40 mm Mustang with excellent results.  Much better thrill.  No extravasation.  Sheath was again removed and a 4-0 Monocryl purse-string suture was sewn around the sheath.  The sheath was removed while tying down the suture.  A sterile bandage was applied  to the puncture site.  COMPLICATIONS: None  CONDITION: Stable  Marty Heck, MD [facility] Office: [place]   05/19/2021 12:42 PM

## 2021-05-19 NOTE — H&P (Signed)
History and Physical Interval Note:  05/19/2021 11:23 AM  Jon Anderson  has presented today for surgery, with the diagnosis of complication of avf.  The various methods of treatment have been discussed with the patient and family. After consideration of risks, benefits and other options for treatment, the patient has consented to  Procedure(s): A/V Fistulagram (Left) as a surgical intervention.  The patient's history has been reviewed, patient examined, no change in status, stable for surgery.  I have reviewed the patient's chart and labs.  Questions were answered to the patient's satisfaction.    Left arm fistulogram.  Marty Heck  Patient name: Jon Anderson    MRN: [id number]        DOB: Jul 24, 1950          Sex: male   REASON FOR CONSULT: Evaluate for declot of left arm AV graft   HPI: Jon Anderson is a 71 y.o. male, with end-stage renal disease on dialysis Monday Wednesday Friday who is referred for evaluation of no thrill or bruit in left arm AV graft.  Referral was placed by Dr. Joelyn Oms on 04/20/2021.  Patient states they have been using his right IJ tunneled catheter since last year.  In review of the notes, he has somewhat of a complex history and initially had a left basilic vein fistula by Dr. Donzetta Matters on 11/09/2020.  This was ultimately converted to interposition cephalic vein and then most recently on 02/15/2021 he had an left upper extremity revision with AV graft.  He states that dialysis has never used the graft.          Past Medical History:  Diagnosis Date   Anemia of chronic disease     Arthritis     CAD (coronary artery disease)      a.  Myoview 4/11: EF 53%, no scar or ischemia   c. MV 2012 Nl perfusion, apical thinning.  No ischemia or scar.  EF 49%, appears greater by visual estimate.;  d.  Dob stress echo 12/13:  Negative Dob stress echo. There is no evidence of ischemia.  The LVF is normal. b. Normal cors 2016.   Carotid stenosis      a. <93% RICA,  >79% LICA by duplex 0/2409   Chronic chest pain      occ   Constipation      chronic   Dyspnea      occasional with extertion   ESRD (end stage renal disease) on dialysis ([place])      M-W-F- [name]   GERD (gastroesophageal reflux disease)     History of hiatal hernia     History of kidney stones      Passed   HNP (herniated nucleus pulposus), lumbar     HTN (hypertension)      echo 3/10: EF 60%, LAE   Hyperlipidemia     Nephrolithiasis      "passed them all"   Peripheral arterial disease ([place])      a. s/p PTCA  right    Pneumonia yrs ago   Restless legs     Sciatic leg pain     Sleep apnea      does not use cpap   Snores      a. presumed OSA, pt has refused sleep eval in past.   Type II diabetes mellitus ([place])      no longer on medications, checks blood glucose at home   Urinary frequency     Uses wheelchair  Walker as ambulation aid             Past Surgical History:  Procedure Laterality Date   A/V FISTULAGRAM Left 01/10/2021    Procedure: A/V FISTULAGRAM;  Surgeon: Waynetta Sandy, MD;  Location: [facility];  Service: Cardiovascular;  Laterality: Left;   ABDOMINAL AORTOGRAM W/LOWER EXTREMITY Bilateral 08/21/2019    Procedure: ABDOMINAL AORTOGRAM W/LOWER EXTREMITY;  Surgeon: Waynetta Sandy, MD;  Location: [facility];  Service: Cardiovascular;  Laterality: Bilateral;   AMPUTATION Right 07/25/2019    Procedure: RIGHT 5th RAY AMPUTATION;  Surgeon: Newt Minion, MD;  Location: [place];  Service: Orthopedics;  Laterality: Right;   AMPUTATION Right 11/19/2019    Procedure: RIGHT TRANSMETATARSAL AMPUTATION;  Surgeon: Newt Minion, MD;  Location: [place];  Service: Orthopedics;  Laterality: Right;   ANGIOPLASTY / STENTING FEMORAL Left 12/11/2013    dr berry   AV FISTULA PLACEMENT Left 03/19/2014    Procedure: CREATION OF ARTERIOVENOUS (AV) FISTULA  LEFT UPPER ARM;  Surgeon: Mal Misty, MD;  Location: [place];  Service: Vascular;   Laterality: Left;   AV FISTULA PLACEMENT Left 11/09/2020    Procedure: LEFT ARM ARTERIOVENOUS (AV) FISTULA CREATION;  Surgeon: Waynetta Sandy, MD;  Location: [place];  Service: Vascular;  Laterality: Left;   AV FISTULA PLACEMENT Left 02/15/2021    Procedure: INSERTION OF LEFT ARM ARTERIOVENOUS GORE-TEX GRAFT;  Surgeon: Waynetta Sandy, MD;  Location: [place];  Service: Vascular;  Laterality: Left;   BACK SURGERY   01/2018    screws placed    [place] Left 12/14/2020    Procedure: REVISION OF LEFT ARM SECOND STAGE BASILIC VEIN TRANSPOSITION;  Surgeon: Waynetta Sandy, MD;  Location: [place];  Service: Vascular;  Laterality: Left;   CARDIAC CATHETERIZATION   2001 and 2010    COLONOSCOPY W/ BIOPSIES AND POLYPECTOMY       COLONOSCOPY WITH PROPOFOL N/A 08/01/2016    Procedure: COLONOSCOPY WITH PROPOFOL;  Surgeon: Carol Ada, MD;  [facility];  Service: Endoscopy;  Laterality: N/A;   CORONARY ARTERY BYPASS GRAFT   2019    [facility] x [facility] bypass   ESOPHAGOGASTRODUODENOSCOPY (EGD) WITH PROPOFOL N/A 08/01/2016    Procedure: ESOPHAGOGASTRODUODENOSCOPY (EGD) WITH PROPOFOL;  Surgeon: Carol Ada, MD;  [facility];  Service: Endoscopy;  Laterality: N/A;   FISTULA SUPERFICIALIZATION Left 02/10/2020    Procedure: 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;  Surgeon: Rosetta Posner, MD;  Location: [place];  Service: Vascular;  Laterality: Left;   FISTULA SUPERFICIALIZATION Left 03/23/2020    Procedure: LEFT UPPER EXTREMITY ARTERIOVENOUS FISTULA PLICATION;  Surgeon: Rosetta Posner, MD;  Location: [place];  Service: Vascular;  Laterality: Left;   FOOT FRACTURE SURGERY Right      ligament repair   FRACTURE SURGERY        left forearm   GRAFT APPLICATION Right 07/12/3152    Procedure: FAT GRAFT APPLICATION;  Surgeon: Evelina Bucy, DPM;  Location: [place];  Service: Podiatry;  Laterality: Right;   HEMATOMA EVACUATION Left  11/09/2020    Procedure: EVACUATION HEMATOMA LEFT ARM;  Surgeon: Angelia Mould, MD;  Location: [facility] OR;  Service: Vascular;  Laterality: Left;   INGUINAL HERNIA REPAIR Left     LEFT HEART CATH AND CORS/GRAFTS ANGIOGRAPHY N/A 04/27/2017    Procedure: LEFT HEART CATH AND CORS/GRAFTS ANGIOGRAPHY;  Surgeon: Leonie Man, MD;  Location: [place] [facility];  Service: Cardiovascular;  Laterality: N/A;   LEFT HEART CATHETERIZATION WITH CORONARY ANGIOGRAM N/A 06/22/2014    Procedure: LEFT HEART CATHETERIZATION WITH CORONARY ANGIOGRAM;  Surgeon: [place] Sine, MD;  Location: [facility] C[facility] LAB;  Service: Cardiovascular;  Laterality: N/A;   LOWER EXTREMITY ANGIOGRAM Left 12/11/2013    Procedure: LOWER EXTREMITY ANGIOGRAM;  Surgeon: Lorretta Harp, MD;  Location: [facility] CATH LAB;  Service: Cardiovascular;  Laterality: Left;   LUMBAR LAMINECTOMY/DECOMPRESSION MICRODISCECTOMY Right 07/03/2017    Procedure: MICRODISCECTOMY LUMBAR FIVE - SACRAL ONE RIGHT;  Surgeon: Consuella Lose, MD;  Location: [place];  Service: Neurosurgery;  Laterality: Right;   LUMBAR LAMINECTOMY/DECOMPRESSION MICRODISCECTOMY Right 10/19/2017    Procedure: MICRODISCECTOMY LUMBAR FIVE- SACRAL 1 ONE ;  Surgeon: Consuella Lose, MD;  Location: [place];  Service: Neurosurgery;  Laterality: Right;   PERIPHERAL VASCULAR INTERVENTION Left 01/10/2021    Procedure: PERIPHERAL VASCULAR INTERVENTION;  Surgeon: Waynetta Sandy, MD;  Location: [place] [facility];  Service: Cardiovascular;  Laterality: Left;   THROMBECTOMY W/ EMBOLECTOMY Left 11/09/2020    Procedure: THROMBECTOMY OF LEFT ARTERIOVENOUS FISTULA;  Surgeon: Angelia Mould, MD;  Location: [place];  Service: Vascular;  Laterality: Left;   TONSILLECTOMY AND ADENOIDECTOMY       WISDOM TOOTH EXTRACTION       WOUND DEBRIDEMENT Right 05/13/2019    Procedure: [place];  Surgeon: Evelina Bucy, DPM;  Location: [place];  Service: Podiatry;  Laterality: Right;            Family History  Problem Relation Age of Onset   Heart attack Sister          died @ 30   Cancer Mother          died @ 88; unknown type   Diabetes Brother          deceased   Cirrhosis Father          alcohol related   Diabetes Father     Esophageal cancer Neg Hx     Colon cancer Neg Hx     Pancreatic cancer Neg Hx     Stomach cancer Neg Hx        SOCIAL HISTORY: Social History         Socioeconomic History   Marital status: Married      Spouse name: Not on file   Number of children: Not on file   Years of education: Not on file   Highest education level: Not on file  Occupational History   Occupation: retired  Tobacco Use   Smoking status: Former      Packs/day: 1.00      Years: 2.00      Pack years: 2.00      Types: Cigarettes      Quit date: 1978      Years since quitting: 45.1   Smokeless tobacco: Never  Vaping Use   Vaping Use: Never used  Substance and Sexual Activity   Alcohol use: Not Currently      Alcohol/week: 0.0 standard drinks      Comment: h/o social drinking   Drug use: Not Currently      Types: Marijuana      Comment: quit 1986   Sexual activity: Yes      Birth control/protection: None  Other Topics Concern   Not on file  Social History Narrative    The patient is a Retail buyer.  He is married and has 2 grown children.  Lives  in [place] with his wife.  Denies tobacco, alcohol or IV drug abuse or marijuana or cocaine intake.     Social Determinants of Health    Financial Resource Strain: Not on file  Food Insecurity: Not on file  Transportation Needs: Not on file  Physical Activity: Not on file  Stress: Not on file  Social Connections: Not on file  Intimate Partner Violence: Not on file           Allergies  Allergen Reactions   Contrast Media [Iodinated Contrast Media] Anaphylaxis      Throat swelling, hives, SOB   Kiwi Extract Itching, Swelling and Other (See Comments)      Lips and face swell- breathing not affected    Flexeril [Cyclobenzaprine]        Hands become flimsy, can not hold things   Tape Other (See Comments)      "Plastic" tape causes blisters!!            Current Outpatient Medications  Medication Sig Dispense Refill   acetaminophen (TYLENOL) 325 MG tablet Take 650 mg by mouth every 6 (six) hours as needed for mild pain.       aspirin EC 81 MG tablet Take 1 tablet (81 mg total) by mouth daily.       atorvastatin (LIPITOR) 40 MG tablet Take 1 tablet (40 mg total) by mouth daily at 6 PM. 30 tablet 5   calcitRIOL (ROCALTROL) 0.25 MCG capsule Take 0.25 mcg by mouth every Monday, Wednesday, and Friday.       calcium acetate (PHOSLO) 667 MG capsule Take 2 capsules (1,334 mg total) by mouth 2 (two) times daily with a meal. 180 capsule 0   clopidogrel (PLAVIX) 75 MG tablet Take 75 mg by mouth daily.       COVID-19 mRNA bivalent vaccine, Pfizer, (PFIZER COVID-19 VAC BIVALENT) injection Inject into the muscle. 0.3 mL 0   docusate sodium (COLACE) 100 MG capsule Take 100 mg by mouth daily as needed for moderate constipation.       gabapentin (NEURONTIN) 100 MG capsule Take 100-200 mg by mouth See admin instructions. 100 mg in the morning, 200 mg in the evening       loperamide (IMODIUM) 2 MG capsule Take 2 mg by mouth as needed for diarrhea or loose stools.       midodrine (PROAMATINE) 10 MG tablet Take 10 mg by mouth in the morning and at bedtime.       multivitamin (RENA-VIT) TABS tablet Take 1 tablet by mouth at bedtime. 30 tablet 0   ONETOUCH ULTRA test strip See admin instructions.       oxyCODONE-acetaminophen (PERCOCET) 5-325 MG tablet Take 1 tablet by mouth every 6 (six) hours as needed for moderate pain. 20 tablet 0   pantoprazole (PROTONIX) 40 MG tablet Take 40 mg by mouth daily.       polyethylene glycol (MIRALAX / GLYCOLAX) packet Take 17 g by mouth daily as needed for moderate constipation. 14 each 0    No current facility-administered medications for this visit.      REVIEW OF SYSTEMS:   [X]  denotes positive finding, [ ]  denotes negative finding Cardiac   Comments:  Chest pain or chest pressure:      Shortness of breath upon exertion:      Short of breath when lying flat:      Irregular heart rhythm:             Vascular  Pain in calf, thigh, or hip brought on by ambulation:      Pain in feet at night that wakes you up from your sleep:       Blood clot in your veins:      Leg swelling:              Pulmonary      Oxygen at home:      Productive cough:       Wheezing:              Neurologic      Sudden weakness in arms or legs:       Sudden numbness in arms or legs:       Sudden onset of difficulty speaking or slurred speech:      Temporary loss of vision in one eye:       Problems with dizziness:              Gastrointestinal      Blood in stool:       Vomited blood:              Genitourinary      Burning when urinating:       Blood in urine:             Psychiatric      Major depression:              Hematologic      Bleeding problems:      Problems with blood clotting too easily:             Skin      Rashes or ulcers:             Constitutional      Fever or chills:          PHYSICAL EXAM:    Vitals:    05/17/21 1455  BP: 101/64  Pulse: 63  Resp: 18  Temp: 98 F (36.7 C)  TempSrc: Temporal  SpO2: 96%  Weight: 170 lb (77.1 kg)  Height: 5\' 11"  (1.803 m)      GENERAL: The patient is a well-nourished male, in no acute distress. The vital signs are documented above. CARDIAC: There is a regular rate and rhythm.  VASCULAR:  Left arm AV graft with appreciable thrill on exam and weakly palpable radial pulse at the wrist Right IJ tunneled dialysis catheter PULMONARY: No respiratory distress.. ABDOMEN: Soft and non-tender.  MUSCULOSKELETAL: There are no major deformities or cyanosis. NEUROLOGIC: No focal weakness or paresthesias are detected. SKIN: There are no ulcers or rashes noted. PSYCHIATRIC: The patient has a normal  affect.   DATA:    Right arm vein mapping shows usable basilic vein   Assessment/Plan:   71 year old male with end-stage renal disease on hemodialysis Monday Wednesday Friday that presents for evaluation of left arm AV graft.  The referral states that there is no bruit or thrill and evaluate for declot.  That being said, on exam he does have an appreciable thrill that I can feel and he states they have not been able to use this since it was placed on 02/15/2021 by Dr. Donzetta Matters.  I have recommended a left upper extremity fistulogram and possible intervention to see if the graft could remain salvageable.  Discussed ultimately if this fails, may need to consider right arm access and he does appear to have a good basilic vein in the right arm.  We will get him scheduled  for this Thursday in the Cath Lab.     Marty Heck, MD [facility] Office: [phone]

## 2021-05-20 ENCOUNTER — Encounter (HOSPITAL_COMMUNITY): Payer: Self-pay | Admitting: Vascular Surgery

## 2021-06-09 ENCOUNTER — Other Ambulatory Visit (HOSPITAL_COMMUNITY): Payer: Self-pay | Admitting: Nephrology

## 2021-06-09 DIAGNOSIS — N186 End stage renal disease: Secondary | ICD-10-CM

## 2021-06-15 ENCOUNTER — Telehealth (HOSPITAL_COMMUNITY): Payer: Self-pay

## 2021-06-15 NOTE — Telephone Encounter (Signed)
Returned pt's call, no answer, left vm. AW  

## 2021-06-16 ENCOUNTER — Other Ambulatory Visit: Payer: Self-pay

## 2021-06-16 ENCOUNTER — Ambulatory Visit (HOSPITAL_COMMUNITY)
Admission: RE | Admit: 2021-06-16 | Discharge: 2021-06-16 | Disposition: A | Discharge status: Home / Self Care | Payer: Medicare Other | Source: Ambulatory Visit | Attending: Nephrology | Admitting: Nephrology

## 2021-06-16 DIAGNOSIS — N186 End stage renal disease: Secondary | ICD-10-CM | POA: Diagnosis not present

## 2021-06-16 DIAGNOSIS — Z4901 Encounter for fitting and adjustment of extracorporeal dialysis catheter: Secondary | ICD-10-CM | POA: Insufficient documentation

## 2021-06-16 HISTORY — PX: IR REMOVAL TUN CV CATH W/O FL: IMG2289

## 2021-06-16 MED ORDER — LIDOCAINE HCL 1 % IJ SOLN
INTRAMUSCULAR | Status: AC
  Filled 2021-06-16: qty 20

## 2021-06-16 MED ORDER — LIDOCAINE HCL (PF) 1 % IJ SOLN
INTRAMUSCULAR | Status: DC | PRN
  Administered 2021-06-16: 10 mL

## 2021-07-05 ENCOUNTER — Other Ambulatory Visit: Payer: Self-pay

## 2021-07-05 ENCOUNTER — Encounter: Payer: Self-pay | Admitting: Podiatry

## 2021-07-05 ENCOUNTER — Ambulatory Visit (INDEPENDENT_AMBULATORY_CARE_PROVIDER_SITE_OTHER): Payer: Medicare Other | Admitting: Podiatry

## 2021-07-05 DIAGNOSIS — Z992 Dependence on renal dialysis: Secondary | ICD-10-CM

## 2021-07-05 DIAGNOSIS — Z89431 Acquired absence of right foot: Secondary | ICD-10-CM

## 2021-07-05 DIAGNOSIS — E1151 Type 2 diabetes mellitus with diabetic peripheral angiopathy without gangrene: Secondary | ICD-10-CM

## 2021-07-05 DIAGNOSIS — B351 Tinea unguium: Secondary | ICD-10-CM | POA: Diagnosis not present

## 2021-07-05 DIAGNOSIS — L84 Corns and callosities: Secondary | ICD-10-CM

## 2021-07-05 DIAGNOSIS — E0822 Diabetes mellitus due to underlying condition with diabetic chronic kidney disease: Secondary | ICD-10-CM | POA: Diagnosis not present

## 2021-07-05 DIAGNOSIS — N186 End stage renal disease: Secondary | ICD-10-CM

## 2021-07-05 DIAGNOSIS — E119 Type 2 diabetes mellitus without complications: Secondary | ICD-10-CM

## 2021-07-05 NOTE — Progress Notes (Signed)
ANNUAL DIABETIC FOOT EXAM ? ?Subjective: ?Jon Anderson presents today for annual diabetic foot examination and at risk foot care with h/o NIDDM with ESRD on hemodialysis. He has dialysis on MWF. ? ?Patient relates several year h/o diabetes. ? ?Patient has h/o TMA right foot performed by Dr. Meridee Score. ? ?Patient admits symptoms of foot numbness and tingling. ? ?Today, he complains of plantarlateral pain of right foot. He states he has a metal plate in the right shoe, but is concerned shoe is still bending at midfoot area and this could be causing his pain. ? ?Patient has been diagnosed with neuropathy and it is managed with gabapentin. ? ?Patient did not check blood glucose this morning. ? ?Risk factors: h/o TMA amputation right foot, diabetes, diabetic neuropathy, PAD, HTN, CAD, CHF, hyperlipidemia, ESRD on hemodialysis, h/o tobacco use in remission, history of foot/leg ulcer. ? ?[name], MD is patient's PCP. Last visit was February , 2023. ? ?Past Medical History:  ?Diagnosis Date  ? Anemia of chronic disease   ? Arthritis   ? CAD (coronary artery disease)   ? a.  Myoview 4/11: EF 53%, no scar or ischemia   c. MV 2012 Nl perfusion, apical thinning.  No ischemia or scar.  EF 49%, appears greater by visual estimate.;  d.  Dob stress echo 12/13:  Negative Dob stress echo. There is no evidence of ischemia.  The LVF is normal. b. Normal cors 2016.  ? Carotid stenosis   ? a. <16% RICA, >10% LICA by duplex 12/6043  ? Chronic chest pain   ? occ  ? Constipation   ? chronic  ? Dyspnea   ? occasional with extertion  ? ESRD (end stage renal disease) on dialysis [facility])   ? M-W-FRicharda Blade  ? GERD (gastroesophageal reflux disease)   ? History of hiatal hernia   ? History of kidney stones   ? Passed  ? HNP (herniated nucleus pulposus), lumbar   ? HTN (hypertension)   ? echo 3/10: EF 60%, LAE  ? Hyperlipidemia   ? Nephrolithiasis   ? "passed them all"  ? Peripheral arterial disease ([place])   ? a. s/p PTCA  right   ?  Pneumonia yrs ago  ? Restless legs   ? Sciatic leg pain   ? Sleep apnea   ? does not use cpap  ? Snores   ? a. presumed OSA, pt has refused sleep eval in past.  ? Type II diabetes mellitus ([place])   ? no longer on medications, checks blood glucose at home  ? Urinary frequency   ? Uses wheelchair   ? Walker as ambulation aid   ? ?Patient Active Problem List  ? Diagnosis Date Noted  ? Encounter for change or removal of surgical wound dressing 12/10/2019  ? Acquired absence of other left toe(s) ([place]) 12/10/2019  ? Type 2 diabetes mellitus with diabetic chronic kidney disease ([place]) 12/03/2019  ? Right below-knee amputee ([place]) 11/25/2019  ? S/P transmetatarsal amputation of foot, right ([place]) 11/25/2019  ? Gangrene of right foot ([place]) 11/19/2019  ? History of partial ray amputation of fifth toe of right foot ([place]) 09/09/2019  ? Ischemic ulcer diabetic foot ([place]) 09/09/2019  ? Subacute osteomyelitis, right ankle and foot ([place])   ? Diabetic foot infection ([place]) 07/24/2019  ? Chronic diastolic CHF (congestive heart failure) ([place]) 07/24/2019  ? Diabetic ulcer of right midfoot associated with diabetes mellitus due to underlying condition, with fat layer exposed ([place])   ?  Gastroenteritis 04/24/2019  ? Presence of aortocoronary bypass graft 04/11/2019  ? Long term (current) use of aspirin 04/11/2019  ? Dependence on renal dialysis ([place]) 04/11/2019  ? Other constipation 04/11/2019  ? Allergy, unspecified, initial encounter 01/27/2019  ? DM neuropathy with neurologic complication ([place]) 37/01/6268  ? GERD (gastroesophageal reflux disease) 02/28/2018  ? Nephrolithiasis 02/28/2018  ? Sciatic leg pain 02/28/2018  ? Snores 02/28/2018  ? Orthopnea 02/23/2018  ? Elevated troponin 02/23/2018  ? HNP (herniated nucleus pulposus), lumbar 07/03/2017  ? Encounter for removal of sutures 06/01/2017  ? Chest pain in adult 04/27/2017  ? Restless leg syndrome, uncontrolled 04/27/2017  ? 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/[facility] 03/17/2017  ? Anemia of chronic  disease 03/17/2017  ? ESRD (end stage renal disease) on dialysis ([place]) 03/17/2017  ? Chronic chest pain 03/17/2017  ? Type II diabetes mellitus ([place]) 03/17/2017  ? Acute on chronic diastolic heart failure ([place]) 03/17/2017  ? Acute heart failure ([place]) 03/17/2017  ? Lumbar radiculopathy 01/16/2017  ? Pre-transplant evaluation for kidney transplant 06/15/2016  ? Pain, unspecified 04/17/2016  ? Nocturia 12/17/2015  ? Chest pain, non-cardiac 10/09/2015  ? Hypercalcemia 05/10/2015  ? Other fluid overload 02/24/2015  ? 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 Fatty (change of) liver, not elsewhere classified 07/02/2014  ? Aftercare including intermittent dialysis ([place]) 06/24/2014  ? Diarrhea, unspecified 06/24/2014  ? Fever, unspecified 06/24/2014  ? Iron deficiency anemia, unspecified 06/24/2014  ? Other specified coagulation defects ([place]) 06/24/2014  ? Pruritus, unspecified 06/24/2014  ? Secondary hyperparathyroidism of renal origin ([place]) 06/24/2014  ? Type 2 diabetes mellitus with diabetic peripheral angiopathy without gangrene ([place]) 06/24/2014  ? Acute on chronic renal failure ([place]) 06/16/2014  ? Shoulder pain, left 12/15/2013  ? Chest pain 12/15/2013  ? Claudication ([place]) 12/11/2013  ? PVD (peripheral vascular disease) ([place]) 12/11/2013  ? Carotid artery disease ([place]) 09/30/2013  ? Acute chest pain 11/15/2012  ? Bruit 09/15/2010  ? CAD (coronary artery disease) nonobstructive per cath 2012   ? Hyperlipidemia LDL goal <70 07/22/2009  ? Essential hypertension 07/22/2009  ? ?Past Surgical History:  ?Procedure Laterality Date  ? A/V FISTULAGRAM Left 01/10/2021  ? Procedure: A/V FISTULAGRAM;  Surgeon: Waynetta Sandy, MD;  Location: [place] [facility];  Service: Cardiovascular;  Laterality: Left;  ? A/V FISTULAGRAM Left 05/19/2021  ? Procedure: A/V Fistulagram;  Surgeon: Marty Heck, MD;  Location: [place] [facility];  Service: Cardiovascular;  Laterality: Left;   ? ABDOMINAL AORTOGRAM W/LOWER EXTREMITY Bilateral 08/21/2019  ? Procedure: ABDOMINAL AORTOGRAM W/LOWER EXTREMITY;  Surgeon: Waynetta Sandy, MD;  Location: [place] [facility];  Service: Cardiovascular;  Laterality: Bilateral;  ? AMPUTATION Right 07/25/2019  ? Procedure: RIGHT 5th RAY AMPUTATION;  Surgeon: Newt Minion, MD;  Location: [place];  Service: Orthopedics;  Laterality: Right;  ? AMPUTATION Right 11/19/2019  ? Procedure: RIGHT TRANSMETATARSAL AMPUTATION;  Surgeon: Newt Minion, MD;  Location: [place];  Service: Orthopedics;  Laterality: Right;  ? ANGIOPLASTY / STENTING FEMORAL Left 12/11/2013  ? dr berry  ? AV FISTULA PLACEMENT Left 03/19/2014  ? Procedure: CREATION OF ARTERIOVENOUS (AV) FISTULA  LEFT UPPER ARM;  Surgeon: Mal Misty, MD;  Location: [place];  Service: Vascular;  Laterality: Left;  ? AV FISTULA PLACEMENT Left 11/09/2020  ? Procedure: LEFT ARM ARTERIOVENOUS (AV) FISTULA CREATION;  Surgeon: Waynetta Sandy, MD;  Location: [place];  Service: Vascular;  Laterality: Left;  ? AV FISTULA  PLACEMENT Left 02/15/2021  ? Procedure: INSERTION OF LEFT ARM ARTERIOVENOUS GORE-TEX GRAFT;  Surgeon: Waynetta Sandy, MD;  Location: [place];  Service: Vascular;  Laterality: Left;  ? BACK SURGERY  01/2018  ? screws placed   ? BASCILIC VEIN TRANSPOSITION Left 12/14/2020  ? Procedure: REVISION OF LEFT ARM SECOND STAGE BASILIC VEIN TRANSPOSITION;  Surgeon: Waynetta Sandy, MD;  Location: [place];  Service: Vascular;  Laterality: Left;  ? CARDIAC CATHETERIZATION  2001 and 2010   ? COLONOSCOPY W/ BIOPSIES AND POLYPECTOMY    ? COLONOSCOPY WITH PROPOFOL N/A 08/01/2016  ? Procedure: COLONOSCOPY WITH PROPOFOL;  Surgeon: Carol Ada, MD;  [facility];  Service: Endoscopy;  Laterality: N/A;  ? CORONARY ARTERY BYPASS GRAFT  2019  ? [facility] x [facility] bypass  ? ESOPHAGOGASTRODUODENOSCOPY (EGD) WITH PROPOFOL N/A 08/01/2016  ? Procedure: ESOPHAGOGASTRODUODENOSCOPY (EGD) WITH PROPOFOL;  Surgeon:  Carol Ada, MD;  [facility];  Service: Endoscopy;  Laterality: N/A;  ? FISTULA SUPERFICIALIZATION Left 02/10/2020  ? Procedure: 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;  Surgeon: Ear

## 2021-10-18 ENCOUNTER — Ambulatory Visit (INDEPENDENT_AMBULATORY_CARE_PROVIDER_SITE_OTHER): Payer: Medicare Other | Admitting: Podiatry

## 2021-10-18 ENCOUNTER — Encounter: Payer: Self-pay | Admitting: Podiatry

## 2021-10-18 DIAGNOSIS — L84 Corns and callosities: Secondary | ICD-10-CM | POA: Diagnosis not present

## 2021-10-18 DIAGNOSIS — Z89431 Acquired absence of right foot: Secondary | ICD-10-CM

## 2021-10-18 DIAGNOSIS — E1151 Type 2 diabetes mellitus with diabetic peripheral angiopathy without gangrene: Secondary | ICD-10-CM | POA: Diagnosis not present

## 2021-10-18 DIAGNOSIS — B351 Tinea unguium: Secondary | ICD-10-CM

## 2021-10-18 NOTE — Progress Notes (Signed)
Subjective:  Patient ID: [name], male    DOB: October 18, 1950,  MRN: [id number]  DAVIUS GOUDEAU presents to clinic today for at risk foot care. 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 Painful toenails interfere with ambulation. Aggravating factors include wearing enclosed shoe gear. Pain is relieved with periodic professional debridement. Painful calluses are aggravated when weightbearing with and without shoegear. Pain is relieved with periodic professional debridement.  He also has ESRD and is on hemodialysis MWF.  Last A1c was 7.4%. Patient did not check blood glucose today.  Patient relates discomfort of left foot's plantar callus. Denies any redness, drainage or swelling.  He is followed closely by Vascular team, [facility] for AV graft and PAD.  PCP is [name], MD , and last visit was  October 06, 2021  Allergies  Allergen Reactions   Contrast Media [Iodinated Contrast Media] Anaphylaxis    Throat swelling, hives, SOB   Kiwi Extract Itching, Swelling and Other (See Comments)    Lips and face swell- breathing not affected   Flexeril [Cyclobenzaprine]     Hands become flimsy, can not hold things   Tape Other (See Comments)    "Plastic" tape causes blisters!!    Review of Systems: Negative except as noted in the HPI.  Objective:  MAMOUDOU MULVEHILL is a pleasant 71 y.o. male in NAD. AAO X 3.  Vascular Examination: CFT <5 seconds b/l LE. Nonpalpable DP pulse(s) b/l LE. Nonpalpable PT pulse(s) b/l LE. Pedal hair absent. No pain with calf compression b/l. Lower extremity skin temperature gradient warm to cool. Trace edema RLE. Evidence of chronic venous insufficiency b/l LE. No ischemia or gangrene noted b/l LE. No cyanosis or clubbing noted b/l LE.  Dermatological Examination: Pedal skin thin, shiny and  atrophic b/l LE. No open wounds b/l LE. No interdigital macerations left foot. Toenails 1-5 left foot elongated, discolored, dystrophic, thickened, and crumbly with subungual debris and tenderness to dorsal palpation. Hyperkeratotic lesion(s) submet head 5 left foot, plantar aspect left hallux and distal stump of right LE.  No erythema, no edema, no drainage, no fluctuance, but he has some tenderness to plantar callus left foot.   Neurological Examination: Protective sensation diminished with 10g monofilament b/l.  Musculoskeletal Examination: Muscle strength 5/5 to all lower extremity muscle groups bilaterally. Wearing extra depth diabetic shoes with prescribed diabetic inserts. Palpable exostosis noted residual 5th metatarsal distally right stump. Lower extremity amputation(s): Transmetatarsal amputation right foot. Utilizes cane for ambulation assistance.  LOWER EXTREMITY DOPPLER STUDY   Patient Name:  BRAYLIN XU  Date of Exam:   05/03/2021  Medical Rec #: [id number]           Accession #:    [id number]  Date of Birth: 1951/02/11           Patient Gender: M  Patient Age:   71 years  Exam Location:  [facility]  Procedure:      VAS [place] ABI WITH/WO TBI  Referring Phys: Roderic Palau [name]    --------------------------------------------------------------------------- -----     Indications: Peripheral artery disease, and right transmetatarsal  amputation and               patient states his legs hurt all the time but not like they  were               prior to procedure. He  denies any claudication symptoms or  rest               pain.   High Risk Factors: Hypertension, hyperlipidemia, Diabetes, past history of                     smoking, coronary artery disease.   Other Factors: CABG in October 2018.   Vascular Interventions: Successful chocolate balloon angioplasty followed  by                          Lutonix drug eluting balloon angioplasty using CO2  and                           minimal contrast of the left SFA on 12/11/2013.  Balloon                          angioplasty of right anterior tibial artery and  stent of                          right popliteal artery in 08/2019 by Dr. Donzetta Matters.   Comparison Study: In 04/2020, a lower arterial Doppler showed  non-compressible                    ABIs, bilaterally.   Performing Technologist: Sharlett Iles RVT      Examination Guidelines: A complete evaluation includes at minimum, Doppler  waveform signals and systolic blood pressure reading at the level of  bilateral  brachial, anterior tibial, and posterior tibial arteries, when vessel  segments  are accessible. Bilateral testing is considered an integral part of a  complete  examination. Photoelectric Plethysmograph (PPG) waveforms and toe systolic  pressure readings are included as required and additional duplex testing  as  needed. Limited examinations for reoccurring indications may be performed  as  noted.      ABI Findings:  +---------+------------------+-----+--------------------------+--------+  Right    Rt Pressure (mmHg)IndexWaveform                  Comment   +---------+------------------+-----+--------------------------+--------+  Brachial 97                                                         +---------+------------------+-----+--------------------------+--------+  PTA      255               2.63 multiphasic                         +---------+------------------+-----+--------------------------+--------+  PERO     96                0.99 multiphasic                         +---------+------------------+-----+--------------------------+--------+  DP       91                0.94 monophasic                          +---------+------------------+-----+--------------------------+--------+  Great Toe  Transmetatarsal Amputation           +---------+------------------+-----+--------------------------+--------+   +---------+------------------+-----+----------+---------+  Left     Lt Pressure (mmHg)IndexWaveform  Comment    +---------+------------------+-----+----------+---------+  Brachial                                  AVF        +---------+------------------+-----+----------+---------+  PTA                             absent    inaudible  +---------+------------------+-----+----------+---------+  PERO                            absent    inaudible  +---------+------------------+-----+----------+---------+  DP       86                0.89 monophasic           +---------+------------------+-----+----------+---------+  Great Toe34                0.35 Abnormal             +---------+------------------+-----+----------+---------+   +-------+----------------+-----------------+----------------+--------------  ----+  ABI/TBIToday's ABI     Today's TBI      Previous ABI    Previous TBI          +-------+----------------+-----------------+----------------+--------------  ----+  Right  non-compressibleTransmetatarsal    non-compressibleTransmetatarsal                            Amputation                       Amputation            +-------+----------------+-----------------+----------------+-------------- ----+  Left   .89             .35              non-compressible.40                  +-------+----------------+-----------------+----------------+-------------- ----+     Right ABIs and left TBIs appear essentially unchanged compared to prior  study on 04/29/2020. Left ABIs appear decreased compared to prior study on  04/29/2020.     Summary:  Right: Resting right ankle-brachial index indicates noncompressible right  lower extremity arteries.   Transmetatarsal Amputation.  Left: Resting left ankle-brachial index indicates mild left lower  extremity  arterial disease. The left toe-brachial index is abnormal.   *See table(s) above for measurements and observations.  See LE Arterial duplex report.     Suggest follow up study in 12 months.   Electronically signed by Berniece Salines DO on 05/03/2021 at 8:49:55 PM.    Final    Assessment/Plan: 1. Onychomycosis   2. Callus   3. S/P transmetatarsal amputation of foot, right ([place])   4. Type II diabetes mellitus with peripheral circulatory disorder [facility])     -Patient was evaluated and treated. All patient's and/or POA's questions/concerns answered on today's visit. -Callus submet head 5 left foot, plantar hallux and distal stump right foot gently filed with dremel without incident. New padding provided for left shoe insert to offload left 5th metatarsal and left hallux. Patient noted relief with weightbearing after new padding applied. -Toenails 1-5 left foot debrided in length and girth without iatrogenic bleeding  with sterile nail nipper and dremel.  -Patient/POA to call should there be question/concern in the interim.   Return in about 3 months (around 01/18/2022).  Marzetta Board, DPM

## 2021-11-02 ENCOUNTER — Other Ambulatory Visit (HOSPITAL_COMMUNITY): Payer: Self-pay | Admitting: Nephrology

## 2021-11-02 DIAGNOSIS — N186 End stage renal disease: Secondary | ICD-10-CM

## 2021-11-03 ENCOUNTER — Other Ambulatory Visit: Payer: Self-pay | Admitting: Gastroenterology

## 2021-11-08 ENCOUNTER — Other Ambulatory Visit (HOSPITAL_COMMUNITY): Payer: Self-pay | Admitting: Student

## 2021-11-08 DIAGNOSIS — N186 End stage renal disease: Secondary | ICD-10-CM

## 2021-11-09 ENCOUNTER — Other Ambulatory Visit: Payer: Self-pay | Admitting: Radiology

## 2021-11-10 ENCOUNTER — Ambulatory Visit (HOSPITAL_COMMUNITY)
Admission: RE | Admit: 2021-11-10 | Discharge: 2021-11-10 | Disposition: A | Discharge status: Home / Self Care | Payer: Medicare Other | Source: Ambulatory Visit | Attending: Nephrology | Admitting: Nephrology

## 2021-11-10 ENCOUNTER — Telehealth (HOSPITAL_COMMUNITY): Payer: Self-pay | Admitting: Student

## 2021-11-10 ENCOUNTER — Other Ambulatory Visit: Payer: Self-pay

## 2021-11-10 DIAGNOSIS — I120 Hypertensive chronic kidney disease with stage 5 chronic kidney disease or end stage renal disease: Secondary | ICD-10-CM | POA: Insufficient documentation

## 2021-11-10 DIAGNOSIS — I2510 Atherosclerotic heart disease of native coronary artery without angina pectoris: Secondary | ICD-10-CM | POA: Diagnosis not present

## 2021-11-10 DIAGNOSIS — Z992 Dependence on renal dialysis: Secondary | ICD-10-CM | POA: Insufficient documentation

## 2021-11-10 DIAGNOSIS — Z539 Procedure and treatment not carried out, unspecified reason: Secondary | ICD-10-CM | POA: Insufficient documentation

## 2021-11-10 DIAGNOSIS — N186 End stage renal disease: Secondary | ICD-10-CM | POA: Diagnosis not present

## 2021-11-10 DIAGNOSIS — E1122 Type 2 diabetes mellitus with diabetic chronic kidney disease: Secondary | ICD-10-CM | POA: Diagnosis not present

## 2021-11-10 LAB — GLUCOSE, CAPILLARY: Glucose-Capillary: 124 mg/dL — ABNORMAL HIGH (ref 70–99)

## 2021-11-10 MED ORDER — SODIUM CHLORIDE 0.9 % IV SOLN: INTRAVENOUS | Status: DC

## 2021-11-10 MED ORDER — PREDNISONE 50 MG PO TABS: ORAL_TABLET | ORAL | 0 refills | Status: DC

## 2021-11-10 NOTE — H&P (Addendum)
Chief Complaint: Patient was seen in consultation today for left arm AV fistulogram with possible intervention, possible placement of a tunneled HD catheter  Referring Physician(s): Sanford,Ryan B  Supervising Physician: Mir, Sharen Heck  Patient Status: [facility] - Out-pt  History of Present Illness: Jon Anderson is a 71 y.o. male with a medical history significant for CAD, HTN, DM, right foot transmetatarsal amputation and ESRD on hemodialysis. He has had several left AV fistulas/revisions and now currently has a left arm AV graft which was placed 02/15/21.   During recent hemodialysis sessions there have been flow-related issues. Interventional Radiology has been asked to evaluate this patient for an image-guided fistulogram with possible intervention, possible placement of a tunneled HD catheter. He has had a tunneled HD catheter in the past which was placed by Vascular. It was removed in IR 06/16/21   Past Medical History:  Diagnosis Date   Anemia of chronic disease    Arthritis    CAD (coronary artery disease)    a.  Myoview 4/11: EF 53%, no scar or ischemia   c. MV 2012 Nl perfusion, apical thinning.  No ischemia or scar.  EF 49%, appears greater by visual estimate.;  d.  Dob stress echo 12/13:  Negative Dob stress echo. There is no evidence of ischemia.  The LVF is normal. b. Normal cors 2016.   Carotid stenosis    a. <53% RICA, >66% LICA by duplex 07/4032   Chronic chest pain    occ   Constipation    chronic   Dyspnea    occasional with extertion   ESRD (end stage renal disease) on dialysis ([place])    M-W-F- [name]   GERD (gastroesophageal reflux disease)    History of hiatal hernia    History of kidney stones    Passed   HNP (herniated nucleus pulposus), lumbar    HTN (hypertension)    echo 3/10: EF 60%, LAE   Hyperlipidemia    Nephrolithiasis    "passed them all"   Peripheral arterial disease ([place])    a. s/p PTCA  right    Pneumonia yrs ago   Restless legs     Sciatic leg pain    Sleep apnea    does not use cpap   Snores    a. presumed OSA, pt has refused sleep eval in past.   Type II diabetes mellitus ([place])    no longer on medications, checks blood glucose at home   Urinary frequency    Uses wheelchair    Walker as ambulation aid     Past Surgical History:  Procedure Laterality Date   A/V FISTULAGRAM Left 01/10/2021   Procedure: A/V FISTULAGRAM;  Surgeon: Waynetta Sandy, MD;  Location: [place] [facility];  Service: Cardiovascular;  Laterality: Left;   A/V FISTULAGRAM Left 05/19/2021   Procedure: A/V Fistulagram;  Surgeon: Marty Heck, MD;  Location: [place] CV [facility];  Service: Cardiovascular;  Laterality: Left;   ABDOMINAL AORTOGRAM W/LOWER EXTREMITY Bilateral 08/21/2019   Procedure: ABDOMINAL AORTOGRAM W/LOWER EXTREMITY;  Surgeon: Waynetta Sandy, MD;  Location: [place] CV [facility];  Service: Cardiovascular;  Laterality: Bilateral;   AMPUTATION Right 07/25/2019   Procedure: RIGHT 5th RAY AMPUTATION;  Surgeon: Newt Minion, MD;  Location: [place];  Service: Orthopedics;  Laterality: Right;   AMPUTATION Right 11/19/2019   Procedure: RIGHT TRANSMETATARSAL AMPUTATION;  Surgeon: Newt Minion, MD;  Location: [place];  Service: Orthopedics;  Laterality: Right;   ANGIOPLASTY /  STENTING FEMORAL Left 12/11/2013   dr berry   AV FISTULA PLACEMENT Left 03/19/2014   Procedure: CREATION OF ARTERIOVENOUS (AV) FISTULA  LEFT UPPER ARM;  Surgeon: Mal Misty, MD;  Location: [place];  Service: Vascular;  Laterality: Left;   AV FISTULA PLACEMENT Left 11/09/2020   Procedure: LEFT ARM ARTERIOVENOUS (AV) FISTULA CREATION;  Surgeon: Waynetta Sandy, MD;  Location: [place];  Service: Vascular;  Laterality: Left;   AV FISTULA PLACEMENT Left 02/15/2021   Procedure: INSERTION OF LEFT ARM ARTERIOVENOUS GORE-TEX GRAFT;  Surgeon: Waynetta Sandy, MD;  Location: [place];  Service: Vascular;  Laterality: Left;   BACK SURGERY  01/2018    screws placed    [place] Left 12/14/2020   Procedure: REVISION OF LEFT ARM SECOND STAGE BASILIC VEIN TRANSPOSITION;  Surgeon: Waynetta Sandy, MD;  Location: [place];  Service: Vascular;  Laterality: Left;   CARDIAC CATHETERIZATION  2001 and 2010    COLONOSCOPY W/ BIOPSIES AND POLYPECTOMY     COLONOSCOPY WITH PROPOFOL N/A 08/01/2016   Procedure: COLONOSCOPY WITH PROPOFOL;  Surgeon: Carol Ada, MD;  Location: [facility];  Service: Endoscopy;  Laterality: N/A;   CORONARY ARTERY BYPASS GRAFT  2019   [facility] x [facility] bypass   ESOPHAGOGASTRODUODENOSCOPY (EGD) WITH PROPOFOL N/A 08/01/2016   Procedure: ESOPHAGOGASTRODUODENOSCOPY (EGD) WITH PROPOFOL;  Surgeon: Carol Ada, MD;  Location: [facility];  Service: Endoscopy;  Laterality: N/A;   FISTULA SUPERFICIALIZATION Left 02/10/2020   Procedure: 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;  Surgeon: Rosetta Posner, MD;  Location: [place];  Service: Vascular;  Laterality: Left;   FISTULA SUPERFICIALIZATION Left 03/23/2020   Procedure: LEFT UPPER EXTREMITY ARTERIOVENOUS FISTULA PLICATION;  Surgeon: Rosetta Posner, MD;  Location: [place];  Service: Vascular;  Laterality: Left;   FOOT FRACTURE SURGERY Right    ligament repair   FRACTURE SURGERY     left forearm   GRAFT APPLICATION Right 9/0/2409   Procedure: FAT GRAFT APPLICATION;  Surgeon: Evelina Bucy, DPM;  Location: [place];  Service: Podiatry;  Laterality: Right;   HEMATOMA EVACUATION Left 11/09/2020   Procedure: EVACUATION HEMATOMA LEFT ARM;  Surgeon: Angelia Mould, MD;  Location: [place] [facility];  Service: Vascular;  Laterality: Left;   INGUINAL HERNIA REPAIR Left    IR REMOVAL TUN CV CATH W/O FL  06/16/2021   LEFT HEART CATH AND CORS/GRAFTS ANGIOGRAPHY N/A 04/27/2017   Procedure: LEFT HEART CATH AND CORS/GRAFTS ANGIOGRAPHY;  Surgeon: Leonie Man, MD;  Location: [place] CV [facility];  Service: Cardiovascular;  Laterality: N/A;   LEFT HEART  CATHETERIZATION WITH CORONARY ANGIOGRAM N/A 06/22/2014   Procedure: LEFT HEART CATHETERIZATION WITH CORONARY ANGIOGRAM;  Surgeon: Troy Sine, MD;  Location: [facility] C[facility] LAB;  Service: Cardiovascular;  Laterality: N/A;   LOWER EXTREMITY ANGIOGRAM Left 12/11/2013   Procedure: LOWER EXTREMITY ANGIOGRAM;  Surgeon: Lorretta Harp, MD;  Location: [facility] C[facility] LAB;  Service: Cardiovascular;  Laterality: Left;   LUMBAR LAMINECTOMY/DECOMPRESSION MICRODISCECTOMY Right 07/03/2017   Procedure: MICRODISCECTOMY LUMBAR FIVE - SACRAL ONE RIGHT;  Surgeon: Consuella Lose, MD;  Location: [place];  Service: Neurosurgery;  Laterality: Right;   LUMBAR LAMINECTOMY/DECOMPRESSION MICRODISCECTOMY Right 10/19/2017   Procedure: MICRODISCECTOMY LUMBAR FIVE- SACRAL 1 ONE ;  Surgeon: Consuella Lose, MD;  Location: [place];  Service: Neurosurgery;  Laterality: Right;   PERIPHERAL VASCULAR BALLOON ANGIOPLASTY Left 05/19/2021   Procedure: PERIPHERAL VASCULAR BALLOON ANGIOPLASTY;  Surgeon: Marty Heck, MD;  Location: [facility]IVE CV  LAB;  Service: Cardiovascular;  Laterality: Left;   PERIPHERAL VASCULAR INTERVENTION Left 01/10/2021   Procedure: PERIPHERAL VASCULAR INTERVENTION;  Surgeon: Waynetta Sandy, MD;  Location: [place] CV LAB;  Service: Cardiovascular;  Laterality: Left;   THROMBECTOMY W/ EMBOLECTOMY Left 11/09/2020   Procedure: THROMBECTOMY OF LEFT ARTERIOVENOUS FISTULA;  Surgeon: Angelia Mould, MD;  Location: [place];  Service: Vascular;  Laterality: Left;   TONSILLECTOMY AND ADENOIDECTOMY     WISDOM TOOTH EXTRACTION     WOUND DEBRIDEMENT Right 05/13/2019   Procedure: [place];  Surgeon: Evelina Bucy, DPM;  Location: [place];  Service: Podiatry;  Laterality: Right;    Allergies: Contrast media [iodinated contrast media], Kiwi extract, Flexeril [cyclobenzaprine], and Tape  Medications: Prior to Admission medications   Medication Sig Start Date End Date Taking?  Authorizing Provider  acetaminophen (TYLENOL) 500 MG tablet Take 1,000 mg by mouth every 6 (six) hours as needed for moderate pain.   Yes [provider]  aspirin EC 81 MG tablet Take 1 tablet (81 mg total) by mouth daily. 05/28/12  Yes Richardson Dopp T, PA-C  atorvastatin (LIPITOR) 40 MG tablet Take 1 tablet (40 mg total) by mouth daily at 6 PM. 12/01/19  Yes Angiulli, Lavon Paganini, PA-C  CALCITRIOL PO Take 1 tablet by mouth every Monday, Wednesday, and Friday.   Yes [provider]  calcium acetate (PHOSLO) 667 MG capsule Take 2 capsules (1,334 mg total) by mouth 2 (two) times daily with a meal. 12/01/19  Yes Angiulli, Lavon Paganini, PA-C  clopidogrel (PLAVIX) 75 MG tablet Take 75 mg by mouth daily. 12/01/19  Yes [provider]  gabapentin (NEURONTIN) 100 MG capsule Take 100-200 mg by mouth See admin instructions. 100 mg in the morning, 200 mg in the evening 11/28/19  Yes [provider]  loperamide (IMODIUM) 2 MG capsule Take 2 mg by mouth as needed for diarrhea or loose stools.   Yes [provider]  midodrine (PROAMATINE) 10 MG tablet Take 10 mg by mouth in the morning and at bedtime. 12/01/19  Yes [provider]  multivitamin (RENA-VIT) TABS tablet Take 1 tablet by mouth at bedtime. 12/01/19  Yes Angiulli, Lavon Paganini, PA-C  pantoprazole (PROTONIX) 40 MG tablet Take 40 mg by mouth daily.   Yes [provider]  senna-docusate (SENOKOT-S) 8.6-50 MG tablet Take 2 tablets by mouth 2 (two) times daily as needed for mild constipation.   Yes [provider]  Etelcalcetide HCl (PARSABIV IV) Etelcalcetide Hermina Staggers) 08/03/21 08/02/22  [provider]  Donald Siva test strip See admin instructions. 01/14/21   [provider]     Family History  Problem Relation Age of Onset   Heart attack Sister        died @ 62   Cancer Mother        died @ 90; unknown type   Diabetes Brother        deceased   Cirrhosis Father         alcohol related   Diabetes Father    Esophageal cancer Neg Hx    Colon cancer Neg Hx    Pancreatic cancer Neg Hx    Stomach cancer Neg Hx     Social History   Socioeconomic History   Marital status: Married    Spouse name: Not on file   Number of children: Not on file   Years of education: Not on file   Highest education level: Not on file  Occupational History   Occupation: retired  Tobacco Use   Smoking status: Former    Packs/day: 1.00    Years: 2.00    Total pack years: 2.00    Types: Cigarettes    Quit date: 1978    Years since quitting: 45.6   Smokeless tobacco: Never  Vaping Use   Vaping Use: Never used  Substance and Sexual Activity   Alcohol use: Not Currently    Alcohol/week: 0.0 standard drinks of alcohol    Comment: h/o social drinking   Drug use: Not Currently    Types: Marijuana    Comment: quit 1986   Sexual activity: Yes    Birth control/protection: None  Other Topics Concern   Not on file  Social History Narrative   The patient is a Retail buyer.  He is married and has 2 grown children.  Lives in [place] with his wife.  Denies tobacco, alcohol or IV drug abuse or marijuana or cocaine intake.    Social Determinants of Health   Financial Resource Strain: Not on file  Food Insecurity: Not on file  Transportation Needs: Not on file  Physical Activity: Not on file  Stress: Not on file  Social Connections: Not on file    Review of Systems: A 12 point ROS discussed and pertinent positives are indicated in the HPI above.  All other systems are negative.  Review of Systems  Constitutional:  Negative for appetite change and fatigue.  Respiratory:  Negative for cough and shortness of breath.   Cardiovascular:  Negative for chest pain and leg swelling.  Gastrointestinal:  Negative for abdominal pain, diarrhea, nausea and vomiting.  Neurological:  Negative for dizziness and headaches.    Vital Signs: BP (!) 96/57   Pulse 93   Temp (!) 97.5 F (36.4 C)  (Temporal)   Resp 18   Ht '5\' 9"'$  (1.753 m)   Wt 172 lb (78 kg)   SpO2 93%   BMI 25.40 kg/m   Physical Exam Constitutional:      General: He is not in acute distress.    Appearance: He is not ill-appearing.  HENT:     Mouth/Throat:     Mouth: Mucous membranes are moist.     Pharynx: Oropharynx is clear.  Cardiovascular:     Rate and Rhythm: Normal rate and regular rhythm.     Pulses: Normal pulses.     Heart sounds: Normal heart sounds.     Comments: Left upper arm graft. Positive for a faint thrill, positive for moderate bruit. Site is unremarkable.  Pulmonary:     Effort: Pulmonary effort is normal.     Breath sounds: Normal breath sounds.  Abdominal:     General: Bowel sounds are normal.     Palpations: Abdomen is soft.     Tenderness: There is no abdominal tenderness.  Musculoskeletal:     Right lower leg: No edema.     Left lower leg: No edema.  Skin:    General: Skin is warm and dry.  Neurological:     Mental Status: He is alert and oriented to person, place, and time.     Imaging: No results found.  Labs:  CBC: Recent Labs    12/14/20 0624 01/10/21 1117 02/15/21 0624 05/19/21 0919  HGB 15.0 13.9 16.3 15.6  HCT 44.0 41.0 48.0 46.0    COAGS: No results for input(s): "INR", "APTT" in the last 8760 hours.  BMP: Recent Labs    12/14/20 0624 01/10/21 1117  02/15/21 0624 05/19/21 0919  NA 136 138 136 137  K 4.1 4.6 3.9 4.7  CL 94* 97* 97* 96*  GLUCOSE 119* 113* 137* 102*  BUN 23 41* 25* 27*  CREATININE 7.40* 11.40* 9.10* 8.90*    LIVER FUNCTION TESTS: No results for input(s): "BILITOT", "AST", "ALT", "ALKPHOS", "PROT", "ALBUMIN" in the last 8760 hours.  TUMOR MARKERS: No results for input(s): "AFPTM", "CEA", "CA199", "CHROMGRNA" in the last 8760 hours.  Assessment and Plan:  ESRD on hemodialysis via left AV graft; malfunctioning graft: Jon Anderson, 71 year old male, presents today for an image-guided AV fistulogram with possible  intervention. He has an allergy to contrast media (anaphylaxis) and did not receive pre-medication. The procedure will be rescheduled for another day and the pre-medication regimen will be e-prescribed to the patient's [facility] [address] Dr. Joelyn Oms notified. Dr. Joelyn Oms states the patient does not need tunneled dialysis catheter placement today.   All of this information was shared with the patient who acknowledges understanding and agreement with the plan.   Thank you for this interesting consult.  I greatly enjoyed meeting Jon Anderson and look forward to participating in their care.  A copy of this report was sent to the requesting provider on this date.  Electronically Signed: Soyla Dryer, AGACNP-BC [phone] 11/10/2021, 9:24 AM   I spent a total of  30 Minutes   in face to face in clinical consultation, greater than 50% of which was counseling/coordinating care for image-guided AV graft declot with possible intervention, possible placement of a tunneled HD catheter.

## 2021-11-10 NOTE — Telephone Encounter (Signed)
Patient scheduled 11/15/21 at 11 am for fistulogram with possible intervention. The patient has a contrast allergy. Prednisone 50 mg x 3 tablets has been [facility]-pres[facility] [address] The order has been received by the pharmacy. I discussed the prednisone/benadryl regimen with the patient while he was still here in the hospital and I just got off the phone with him to re-explain everything. He knows to take 1 tablet of prednisone at 13 hours, 7 hours and one hour prior to his procedure. I advised him to bring the last tablet and two benadryl tablets (50 mg total) with him to the hospital to take when he gets to short stay. He acknowledged understanding of these instructions. He knows to be NPO the night prior and to have a driver. He knows he needs to arrive at [facility] by 0900.   He has the number to the IR APP office at [facility] to call with any questions/concerns prior to his visit.   Soyla Dryer, [place] [phone] 11/10/2021, 2:54 PM

## 2021-11-14 ENCOUNTER — Other Ambulatory Visit: Payer: Self-pay | Admitting: Radiology

## 2021-11-15 ENCOUNTER — Other Ambulatory Visit: Payer: Self-pay

## 2021-11-15 ENCOUNTER — Inpatient Hospital Stay (HOSPITAL_COMMUNITY)
Admission: AD | Admit: 2021-11-15 | Discharge: 2021-11-18 | DRG: 981 | Disposition: A | Discharge status: Home Health | Payer: Medicare Other | Source: Ambulatory Visit | Attending: Internal Medicine | Admitting: Internal Medicine

## 2021-11-15 ENCOUNTER — Inpatient Hospital Stay (HOSPITAL_COMMUNITY): Payer: Medicare Other

## 2021-11-15 ENCOUNTER — Encounter (HOSPITAL_COMMUNITY): Payer: Self-pay | Admitting: Certified Registered Nurse Anesthetist

## 2021-11-15 ENCOUNTER — Ambulatory Visit (HOSPITAL_COMMUNITY): Payer: Medicare Other | Admitting: Certified Registered Nurse Anesthetist

## 2021-11-15 ENCOUNTER — Encounter (HOSPITAL_COMMUNITY): Payer: Self-pay

## 2021-11-15 ENCOUNTER — Other Ambulatory Visit (HOSPITAL_COMMUNITY): Payer: Self-pay | Admitting: Nephrology

## 2021-11-15 DIAGNOSIS — Z79899 Other long term (current) drug therapy: Secondary | ICD-10-CM

## 2021-11-15 DIAGNOSIS — G8929 Other chronic pain: Secondary | ICD-10-CM | POA: Diagnosis present

## 2021-11-15 DIAGNOSIS — Z91018 Allergy to other foods: Secondary | ICD-10-CM | POA: Diagnosis not present

## 2021-11-15 DIAGNOSIS — E1122 Type 2 diabetes mellitus with diabetic chronic kidney disease: Secondary | ICD-10-CM | POA: Diagnosis present

## 2021-11-15 DIAGNOSIS — K219 Gastro-esophageal reflux disease without esophagitis: Secondary | ICD-10-CM | POA: Diagnosis present

## 2021-11-15 DIAGNOSIS — I2510 Atherosclerotic heart disease of native coronary artery without angina pectoris: Secondary | ICD-10-CM | POA: Diagnosis present

## 2021-11-15 DIAGNOSIS — Z87442 Personal history of urinary calculi: Secondary | ICD-10-CM | POA: Diagnosis not present

## 2021-11-15 DIAGNOSIS — I6522 Occlusion and stenosis of left carotid artery: Secondary | ICD-10-CM | POA: Diagnosis present

## 2021-11-15 DIAGNOSIS — I469 Cardiac arrest, cause unspecified: Secondary | ICD-10-CM | POA: Diagnosis not present

## 2021-11-15 DIAGNOSIS — E785 Hyperlipidemia, unspecified: Secondary | ICD-10-CM | POA: Diagnosis present

## 2021-11-15 DIAGNOSIS — E1151 Type 2 diabetes mellitus with diabetic peripheral angiopathy without gangrene: Secondary | ICD-10-CM | POA: Diagnosis present

## 2021-11-15 DIAGNOSIS — I472 Ventricular tachycardia, unspecified: Secondary | ICD-10-CM | POA: Diagnosis present

## 2021-11-15 DIAGNOSIS — E1165 Type 2 diabetes mellitus with hyperglycemia: Secondary | ICD-10-CM | POA: Diagnosis not present

## 2021-11-15 DIAGNOSIS — Y848 Other medical procedures as the cause of abnormal reaction of the patient, or of later complication, without mention of misadventure at the time of the procedure: Secondary | ICD-10-CM | POA: Diagnosis present

## 2021-11-15 DIAGNOSIS — R778 Other specified abnormalities of plasma proteins: Secondary | ICD-10-CM | POA: Diagnosis not present

## 2021-11-15 DIAGNOSIS — N186 End stage renal disease: Secondary | ICD-10-CM

## 2021-11-15 DIAGNOSIS — I120 Hypertensive chronic kidney disease with stage 5 chronic kidney disease or end stage renal disease: Secondary | ICD-10-CM | POA: Diagnosis present

## 2021-11-15 DIAGNOSIS — E871 Hypo-osmolality and hyponatremia: Secondary | ICD-10-CM | POA: Diagnosis present

## 2021-11-15 DIAGNOSIS — Z888 Allergy status to other drugs, medicaments and biological substances status: Secondary | ICD-10-CM

## 2021-11-15 DIAGNOSIS — G4733 Obstructive sleep apnea (adult) (pediatric): Secondary | ICD-10-CM | POA: Diagnosis present

## 2021-11-15 DIAGNOSIS — J690 Pneumonitis due to inhalation of food and vomit: Secondary | ICD-10-CM | POA: Diagnosis present

## 2021-11-15 DIAGNOSIS — T508X5A Adverse effect of diagnostic agents, initial encounter: Secondary | ICD-10-CM | POA: Diagnosis present

## 2021-11-15 DIAGNOSIS — Z7952 Long term (current) use of systemic steroids: Secondary | ICD-10-CM

## 2021-11-15 DIAGNOSIS — I9589 Other hypotension: Secondary | ICD-10-CM | POA: Diagnosis present

## 2021-11-15 DIAGNOSIS — Y92239 Unspecified place in hospital as the place of occurrence of the external cause: Secondary | ICD-10-CM | POA: Diagnosis not present

## 2021-11-15 DIAGNOSIS — Z91041 Radiographic dye allergy status: Secondary | ICD-10-CM | POA: Diagnosis not present

## 2021-11-15 DIAGNOSIS — D631 Anemia in chronic kidney disease: Secondary | ICD-10-CM | POA: Diagnosis present

## 2021-11-15 DIAGNOSIS — Z7982 Long term (current) use of aspirin: Secondary | ICD-10-CM

## 2021-11-15 DIAGNOSIS — T380X5A Adverse effect of glucocorticoids and synthetic analogues, initial encounter: Secondary | ICD-10-CM | POA: Diagnosis not present

## 2021-11-15 DIAGNOSIS — G2581 Restless legs syndrome: Secondary | ICD-10-CM | POA: Diagnosis present

## 2021-11-15 DIAGNOSIS — Z89421 Acquired absence of other right toe(s): Secondary | ICD-10-CM

## 2021-11-15 DIAGNOSIS — Z833 Family history of diabetes mellitus: Secondary | ICD-10-CM

## 2021-11-15 DIAGNOSIS — R7989 Other specified abnormal findings of blood chemistry: Secondary | ICD-10-CM | POA: Diagnosis not present

## 2021-11-15 DIAGNOSIS — I468 Cardiac arrest due to other underlying condition: Secondary | ICD-10-CM | POA: Diagnosis present

## 2021-11-15 DIAGNOSIS — J9601 Acute respiratory failure with hypoxia: Secondary | ICD-10-CM | POA: Diagnosis present

## 2021-11-15 DIAGNOSIS — Z7902 Long term (current) use of antithrombotics/antiplatelets: Secondary | ICD-10-CM | POA: Diagnosis not present

## 2021-11-15 DIAGNOSIS — M898X9 Other specified disorders of bone, unspecified site: Secondary | ICD-10-CM | POA: Diagnosis present

## 2021-11-15 DIAGNOSIS — Z951 Presence of aortocoronary bypass graft: Secondary | ICD-10-CM

## 2021-11-15 DIAGNOSIS — Z87891 Personal history of nicotine dependence: Secondary | ICD-10-CM

## 2021-11-15 DIAGNOSIS — T886XXA Anaphylactic reaction due to adverse effect of correct drug or medicament properly administered, initial encounter: Secondary | ICD-10-CM | POA: Diagnosis present

## 2021-11-15 DIAGNOSIS — Z992 Dependence on renal dialysis: Secondary | ICD-10-CM

## 2021-11-15 DIAGNOSIS — Z8249 Family history of ischemic heart disease and other diseases of the circulatory system: Secondary | ICD-10-CM

## 2021-11-15 DIAGNOSIS — Z9861 Coronary angioplasty status: Secondary | ICD-10-CM

## 2021-11-15 DIAGNOSIS — E876 Hypokalemia: Secondary | ICD-10-CM | POA: Diagnosis present

## 2021-11-15 HISTORY — PX: IR AV DIALY SHUNT INTRO NEEDLE/INTRACATH INITIAL W/PTA/IMG LEFT: IMG6103

## 2021-11-15 HISTORY — PX: IR US GUIDE VASC ACCESS LEFT: IMG2389

## 2021-11-15 HISTORY — DX: Cardiac arrest, cause unspecified: I46.9

## 2021-11-15 HISTORY — PX: IR US GUIDE VASC ACCESS RIGHT: IMG2390

## 2021-11-15 LAB — COMPREHENSIVE METABOLIC PANEL
ALT: 9 U/L (ref 0–44)
AST: 13 U/L — ABNORMAL LOW (ref 15–41)
Albumin: 2.3 g/dL — ABNORMAL LOW (ref 3.5–5.0)
Alkaline Phosphatase: 64 U/L (ref 38–126)
Anion gap: 14 (ref 5–15)
BUN: 35 mg/dL — ABNORMAL HIGH (ref 8–23)
CO2: 18 mmol/L — ABNORMAL LOW (ref 22–32)
Calcium: 6.7 mg/dL — ABNORMAL LOW (ref 8.9–10.3)
Chloride: 106 mmol/L (ref 98–111)
Creatinine, Ser: 7.88 mg/dL — ABNORMAL HIGH (ref 0.61–1.24)
GFR, Estimated: 7 mL/min — ABNORMAL LOW (ref >60)
Glucose, Bld: 179 mg/dL — ABNORMAL HIGH (ref 70–99)
Potassium: 2.8 mmol/L — ABNORMAL LOW (ref 3.5–5.1)
Sodium: 138 mmol/L (ref 135–145)
Total Bilirubin: 0.5 mg/dL (ref 0.3–1.2)
Total Protein: 4.4 g/dL — ABNORMAL LOW (ref 6.5–8.1)

## 2021-11-15 LAB — GLUCOSE, CAPILLARY
Glucose-Capillary: 245 mg/dL — ABNORMAL HIGH (ref 70–99)
Glucose-Capillary: 226 mg/dL — ABNORMAL HIGH (ref 70–99)
Glucose-Capillary: 301 mg/dL — ABNORMAL HIGH (ref 70–99)
Glucose-Capillary: 313 mg/dL — ABNORMAL HIGH (ref 70–99)
Glucose-Capillary: 64 mg/dL — ABNORMAL LOW (ref 70–99)
Glucose-Capillary: 241 mg/dL — ABNORMAL HIGH (ref 70–99)

## 2021-11-15 LAB — ECHOCARDIOGRAM COMPLETE
Area-P 1/2: 4.08 cm2
Height: 69 in
MV VTI: 1.85 cm2
S' Lateral: 1.8 cm
Weight: 2752 oz

## 2021-11-15 LAB — CBC
HCT: 36.7 % — ABNORMAL LOW (ref 39.0–52.0)
Hemoglobin: 12.4 g/dL — ABNORMAL LOW (ref 13.0–17.0)
MCH: 31.4 pg (ref 26.0–34.0)
MCHC: 33.8 g/dL (ref 30.0–36.0)
MCV: 92.9 fL (ref 80.0–100.0)
Platelets: 185 10*3/uL (ref 150–400)
RBC: 3.95 MIL/uL — ABNORMAL LOW (ref 4.22–5.81)
RDW: 14.1 % (ref 11.5–15.5)
WBC: 9 10*3/uL (ref 4.0–10.5)
nRBC: 0 % (ref 0.0–0.2)

## 2021-11-15 LAB — HEPATITIS B SURFACE ANTIBODY,QUALITATIVE: Hep B S Ab: REACTIVE — AB

## 2021-11-15 LAB — POCT I-STAT 7, (LYTES, BLD GAS, ICA,H+H)
Acid-base deficit: 7 mmol/L — ABNORMAL HIGH (ref 0.0–2.0)
Bicarbonate: 18.6 mmol/L — ABNORMAL LOW (ref 20.0–28.0)
Calcium, Ion: 1.01 mmol/L — ABNORMAL LOW (ref 1.15–1.40)
HCT: 48 % (ref 39.0–52.0)
Hemoglobin: 16.3 g/dL (ref 13.0–17.0)
O2 Saturation: 100 %
Potassium: 3.4 mmol/L — ABNORMAL LOW (ref 3.5–5.1)
Sodium: 132 mmol/L — ABNORMAL LOW (ref 135–145)
TCO2: 20 mmol/L — ABNORMAL LOW (ref 22–32)
pCO2 arterial: 36 mmHg (ref 32–48)
pH, Arterial: 7.32 — ABNORMAL LOW (ref 7.35–7.45)
pO2, Arterial: 480 mmHg — ABNORMAL HIGH (ref 83–108)

## 2021-11-15 LAB — POCT I-STAT, CHEM 8
BUN: 31 mg/dL — ABNORMAL HIGH (ref 8–23)
Calcium, Ion: 0.94 mmol/L — ABNORMAL LOW (ref 1.15–1.40)
Chloride: 103 mmol/L (ref 98–111)
Creatinine, Ser: 8 mg/dL — ABNORMAL HIGH (ref 0.61–1.24)
Glucose, Bld: 172 mg/dL — ABNORMAL HIGH (ref 70–99)
HCT: 38 % — ABNORMAL LOW (ref 39.0–52.0)
Hemoglobin: 12.9 g/dL — ABNORMAL LOW (ref 13.0–17.0)
Potassium: 2.7 mmol/L — CL (ref 3.5–5.1)
Sodium: 140 mmol/L (ref 135–145)
TCO2: 22 mmol/L (ref 22–32)

## 2021-11-15 LAB — MAGNESIUM: Magnesium: 1.3 mg/dL — ABNORMAL LOW (ref 1.7–2.4)

## 2021-11-15 LAB — PHOSPHORUS
Phosphorus: 3.6 mg/dL (ref 2.5–4.6)
Phosphorus: 2.7 mg/dL (ref 2.5–4.6)

## 2021-11-15 LAB — HEPATITIS B CORE ANTIBODY, TOTAL: Hep B Core Total Ab: NONREACTIVE

## 2021-11-15 LAB — LACTIC ACID, PLASMA
Lactic Acid, Venous: 4 mmol/L (ref 0.5–1.9)
Lactic Acid, Venous: 5.9 mmol/L (ref 0.5–1.9)

## 2021-11-15 LAB — HEPATITIS B SURFACE ANTIGEN: Hepatitis B Surface Ag: NONREACTIVE

## 2021-11-15 LAB — HEPATITIS C ANTIBODY: HCV Ab: NONREACTIVE

## 2021-11-15 LAB — HEMOGLOBIN A1C
Hgb A1c MFr Bld: 7.6 % — ABNORMAL HIGH (ref 4.8–5.6)
Mean Plasma Glucose: 171.42 mg/dL

## 2021-11-15 LAB — MRSA NEXT GEN BY PCR, NASAL: MRSA by PCR Next Gen: NOT DETECTED

## 2021-11-15 MED ORDER — ETOMIDATE 2 MG/ML IV SOLN
INTRAVENOUS | Status: DC | PRN
  Administered 2021-11-15: 16 mg via INTRAVENOUS

## 2021-11-15 MED ORDER — POLYETHYLENE GLYCOL 3350 17 G PO PACK: 17.0000 g | PACK | Freq: Every day | ORAL | Status: DC | PRN

## 2021-11-15 MED ORDER — AMIODARONE IV BOLUS ONLY 150 MG/100ML: 150.0000 mg | Freq: Once | INTRAVENOUS | Status: DC

## 2021-11-15 MED ORDER — CHLORHEXIDINE GLUCONATE CLOTH 2 % EX PADS
6.0000 | MEDICATED_PAD | Freq: Every day | CUTANEOUS | Status: DC
  Administered 2021-11-16 (×2): 6 via TOPICAL

## 2021-11-15 MED ORDER — PROPOFOL 1000 MG/100ML IV EMUL
0.0000 ug/kg/min | INTRAVENOUS | Status: DC
  Administered 2021-11-15: 15 ug/kg/min via INTRAVENOUS
  Administered 2021-11-15: 20 ug/kg/min via INTRAVENOUS
  Administered 2021-11-16: 25 ug/kg/min via INTRAVENOUS
  Filled 2021-11-15 (×3): qty 100

## 2021-11-15 MED ORDER — NOREPINEPHRINE 4 MG/250ML-% IV SOLN
0.0000 ug/min | INTRAVENOUS | Status: DC
  Administered 2021-11-15: 10 ug/min via INTRAVENOUS
  Administered 2021-11-16: 9 ug/min via INTRAVENOUS
  Administered 2021-11-16: 10 ug/min via INTRAVENOUS
  Administered 2021-11-16: 9 ug/min via INTRAVENOUS
  Filled 2021-11-15 (×4): qty 250

## 2021-11-15 MED ORDER — IPRATROPIUM-ALBUTEROL 0.5-2.5 (3) MG/3ML IN SOLN: 3.0000 mL | Freq: Four times a day (QID) | RESPIRATORY_TRACT | Status: DC | PRN

## 2021-11-15 MED ORDER — INSULIN ASPART 100 UNIT/ML IJ SOLN
0.0000 [IU] | INTRAMUSCULAR | Status: DC
  Administered 2021-11-15 (×2): 7 [IU] via SUBCUTANEOUS
  Administered 2021-11-15: 3 [IU] via SUBCUTANEOUS
  Administered 2021-11-16: 5 [IU] via SUBCUTANEOUS

## 2021-11-15 MED ORDER — FENTANYL CITRATE (PF) 100 MCG/2ML IJ SOLN
INTRAMUSCULAR | Status: DC | PRN
  Administered 2021-11-15: 25 ug via INTRAVENOUS

## 2021-11-15 MED ORDER — FENTANYL CITRATE (PF) 100 MCG/2ML IJ SOLN
INTRAMUSCULAR | Status: AC
  Filled 2021-11-15: qty 4

## 2021-11-15 MED ORDER — IOHEXOL 300 MG/ML  SOLN
100.0000 mL | Freq: Once | INTRAMUSCULAR | Status: AC | PRN
  Administered 2021-11-15: 44 mL via INTRA_ARTERIAL

## 2021-11-15 MED ORDER — DOCUSATE SODIUM 50 MG/5ML PO LIQD
100.0000 mg | Freq: Two times a day (BID) | ORAL | Status: DC
  Administered 2021-11-15: 100 mg
  Filled 2021-11-15: qty 10

## 2021-11-15 MED ORDER — MAGNESIUM SULFATE 2 GM/50ML IV SOLN
2.0000 g | Freq: Once | INTRAVENOUS | Status: AC
Start: 2021-11-15 — End: 2021-11-15
  Administered 2021-11-15: 2 g via INTRAVENOUS
  Filled 2021-11-15: qty 50

## 2021-11-15 MED ORDER — LIDOCAINE HCL 1 % IJ SOLN
INTRAMUSCULAR | Status: AC
  Administered 2021-11-15: 10 mL
  Filled 2021-11-15: qty 20

## 2021-11-15 MED ORDER — FENTANYL CITRATE PF 50 MCG/ML IJ SOSY
25.0000 ug | PREFILLED_SYRINGE | INTRAMUSCULAR | Status: DC | PRN
  Filled 2021-11-15: qty 1

## 2021-11-15 MED ORDER — DOCUSATE SODIUM 100 MG PO CAPS: 100.0000 mg | ORAL_CAPSULE | Freq: Two times a day (BID) | ORAL | Status: DC | PRN

## 2021-11-15 MED ORDER — ONDANSETRON HCL 4 MG/2ML IJ SOLN
INTRAMUSCULAR | Status: AC | PRN
  Administered 2021-11-15: 4 mg via INTRAVENOUS

## 2021-11-15 MED ORDER — FENTANYL CITRATE PF 50 MCG/ML IJ SOSY
25.0000 ug | PREFILLED_SYRINGE | INTRAMUSCULAR | Status: DC | PRN
  Administered 2021-11-15 – 2021-11-16 (×4): 50 ug via INTRAVENOUS
  Filled 2021-11-15 (×3): qty 1

## 2021-11-15 MED ORDER — PERFLUTREN LIPID MICROSPHERE
1.0000 mL | INTRAVENOUS | Status: AC | PRN
  Administered 2021-11-15: 1 mL via INTRAVENOUS

## 2021-11-15 MED ORDER — ORAL CARE MOUTH RINSE: 15.0000 mL | OROMUCOSAL | Status: DC | PRN

## 2021-11-15 MED ORDER — HEPARIN SODIUM (PORCINE) 1000 UNIT/ML IJ SOLN
INTRAMUSCULAR | Status: AC
  Filled 2021-11-15: qty 10

## 2021-11-15 MED ORDER — ONDANSETRON HCL 4 MG/2ML IJ SOLN
INTRAMUSCULAR | Status: AC
  Filled 2021-11-15: qty 2

## 2021-11-15 MED ORDER — FENTANYL CITRATE (PF) 100 MCG/2ML IJ SOLN
INTRAMUSCULAR | Status: AC
  Filled 2021-11-15: qty 2

## 2021-11-15 MED ORDER — FLUMAZENIL 0.5 MG/5ML IV SOLN
INTRAVENOUS | Status: AC
  Filled 2021-11-15: qty 5

## 2021-11-15 MED ORDER — CALCITRIOL 0.5 MCG PO CAPS
0.7500 ug | ORAL_CAPSULE | ORAL | Status: DC
  Administered 2021-11-16 – 2021-11-18 (×2): 0.75 ug via ORAL
  Filled 2021-11-15 (×2): qty 1

## 2021-11-15 MED ORDER — NALOXONE HCL 0.4 MG/ML IJ SOLN
INTRAMUSCULAR | Status: AC
  Filled 2021-11-15: qty 1

## 2021-11-15 MED ORDER — MIDAZOLAM HCL 2 MG/2ML IJ SOLN
INTRAMUSCULAR | Status: DC | PRN
  Administered 2021-11-15: 1 mg via INTRAVENOUS

## 2021-11-15 MED ORDER — HEPARIN SODIUM (PORCINE) 5000 UNIT/ML IJ SOLN
5000.0000 [IU] | Freq: Three times a day (TID) | INTRAMUSCULAR | Status: DC
  Administered 2021-11-15 – 2021-11-18 (×9): 5000 [IU] via SUBCUTANEOUS
  Filled 2021-11-15 (×9): qty 1

## 2021-11-15 MED ORDER — SODIUM CHLORIDE 0.9 % IV SOLN: INTRAVENOUS | Status: DC | PRN

## 2021-11-15 MED ORDER — POTASSIUM CHLORIDE 10 MEQ/50ML IV SOLN
10.0000 meq | INTRAVENOUS | Status: AC
  Administered 2021-11-15 (×2): 10 meq via INTRAVENOUS
  Filled 2021-11-15 (×2): qty 50

## 2021-11-15 MED ORDER — POTASSIUM CHLORIDE 10 MEQ/50ML IV SOLN
10.0000 meq | Freq: Once | INTRAVENOUS | Status: AC
  Administered 2021-11-15: 10 meq via INTRAVENOUS
  Filled 2021-11-15: qty 50

## 2021-11-15 MED ORDER — LORAZEPAM 2 MG/ML IJ SOLN
INTRAMUSCULAR | Status: AC
  Filled 2021-11-15: qty 1

## 2021-11-15 MED ORDER — SUCCINYLCHOLINE CHLORIDE 200 MG/10ML IV SOSY
PREFILLED_SYRINGE | INTRAVENOUS | Status: DC | PRN
  Administered 2021-11-15: 100 mg via INTRAVENOUS

## 2021-11-15 MED ORDER — MIDAZOLAM HCL 2 MG/2ML IJ SOLN
INTRAMUSCULAR | Status: AC
  Filled 2021-11-15: qty 4

## 2021-11-15 MED ORDER — ORAL CARE MOUTH RINSE
15.0000 mL | OROMUCOSAL | Status: DC
  Administered 2021-11-15 – 2021-11-16 (×5): 15 mL via OROMUCOSAL

## 2021-11-15 MED ORDER — IOHEXOL 300 MG/ML  SOLN: 100.0000 mL | Freq: Once | INTRAMUSCULAR | Status: DC | PRN

## 2021-11-15 MED ORDER — PANTOPRAZOLE 2 MG/ML SUSPENSION
40.0000 mg | Freq: Every day | ORAL | Status: DC
Start: 2021-11-15 — End: 2021-11-16
  Administered 2021-11-15: 40 mg
  Filled 2021-11-15 (×2): qty 20

## 2021-11-15 MED ORDER — POLYETHYLENE GLYCOL 3350 17 G PO PACK
17.0000 g | PACK | Freq: Every day | ORAL | Status: DC
Start: 2021-11-15 — End: 2021-11-16

## 2021-11-15 NOTE — Sedation Documentation (Signed)
Patient attempting to moving around heart rate increased continued to say "My stomach hurts" heart rate 100-150. Patient LOC decreased and code called.  1250 CPR no pulse, BP 62/38 and HR 138, PEA 1250 Narcan  1251 Romazicon 61/38 1252 Compressions no pulse BP 75/50, HR 129 1253 Epi given - Code team arrived  1254 Pulse check radial pulse YES BP 98/38 HR 133 1256 BP 75/51 HR 180 1257 Bicarb 1258 Suction BP 87/60 HR 161 1259 120 Succinycholine and 16 Etomidate - intubation 1300 intubated + capnography  1303 neostick BP 78/57 given by code team nurse 1305 BP 86/72 HR 155 1306 Left femoral central line Dr Mir 1308 BP 89/56 HR 129 1310 BP 81/56 HR 124 Fistula sheath removed.  1312 BP 78/51 HR 98 1313 BP 80/66 HR 171 Versed '1mg'$  per Dr Johnney Ou 1315 BP 83/61 Versed '1mg'$  and Fentanyl 50 mcg per Dr Johnney Ou 1317 BP 86/56 HR 128 1319 BP 59/45 (49) HR 124 Levophed started 1320 1/2 amp Epi 62/45 (52)  1322 BP 182/102 HR 130. Amiodarone 150 per Dr Lake Bells  1323 BP 171/74 HR 116 1325 K 10 Meq 168m  1327 BP 132/64 (86) HR 135. Fentanyl 50 mcg per Dr MLake Bells1[place] 1337 Arrival 7M04 Report given to Ryan at bedside 1343 BP 122/67 HR 115 - Fentanyl 75 mcg per Dr OAnder Slade 1346 Left 7M

## 2021-11-15 NOTE — Progress Notes (Signed)
Echocardiogram 2D Echocardiogram has been performed.  Oneal Deputy Norris Brumbach RDCS 11/15/2021, 3:12 PM

## 2021-11-15 NOTE — Progress Notes (Signed)
RT NOTE: RT transported patient on ventilator from room 2M04 to CT and back to room 2M04 with no apparent complications. Vitals are stable. RT will continue to monitor.

## 2021-11-15 NOTE — H&P (Signed)
Chief Complaint: Patient was seen in consultation today for left upper arm dialysis fistula evaluation and possible intervention at the request of [place],Ryan B  Referring Physician(s): [place] B  Supervising Physician: Mir, Biochemist, clinical  Patient Status: [facility] - Out-pt  History of Present Illness: Jon Anderson is a 71 y.o. male   Hx contrast allergy: anaphylaxis-- pre meds 13 hr prior to this procedure ESRD CAD; HTN Follows with Dr Joelyn Oms [facility]ph[facility] Dialysis M/W/F Last dialysis yesterday-- complete MD states slow arterial flow in notes Requesting fistulogram evaluation  Fistulogram of same fistula 05/19/21 with Dr Wilmer Floor: FINDING(S): The left upper arm graft had no evidence of stenosis at the venous anastomosis or any evidence of central venous stenosis.  There was a stenotic segment adjacent to the arterial anastomosis where a previous segment of vein from an old fistula was used to incorporate the new graft interposition.  The fistula was reaccessed retrograde and then angioplastied with a 6 mm x 40 mm and 7 mm x 40 mm Mustang at the arterial anastomosis with excellent results and no residual stenosis.  Much better thrill.  Palpable radial pulse.     Can use the graft immediately.  If ongoing issues I think we should evaluate for contralateral right arm access or a new left upper arm graft.  Pt states he has been using this fistula since then without issues-- Dr Joelyn Oms says worsening slow flow for few weeks Scheduled today for evaluation and possible intervention Possible tunneled dialysis catheter placement if needed  Past Medical History:  Diagnosis Date   Anemia of chronic disease    Arthritis    CAD (coronary artery disease)    a.  Myoview 4/11: EF 53%, no scar or ischemia   c. MV 2012 Nl perfusion, apical thinning.  No ischemia or scar.  EF 49%, appears greater by visual estimate.;  d.  Dob stress echo 12/13:  Negative Dob stress echo. There is no evidence of  ischemia.  The LVF is normal. b. Normal cors 2016.   Carotid stenosis    a. <65% RICA, >78% LICA by duplex 07/6960   Chronic chest pain    occ   Constipation    chronic   Dyspnea    occasional with extertion   ESRD (end stage renal disease) on dialysis ([place])    M-W-F- [name]   GERD (gastroesophageal reflux disease)    History of hiatal hernia    History of kidney stones    Passed   HNP (herniated nucleus pulposus), lumbar    HTN (hypertension)    echo 3/10: EF 60%, LAE   Hyperlipidemia    Nephrolithiasis    "passed them all"   Peripheral arterial disease ([place])    a. s/p PTCA  right    Pneumonia yrs ago   Restless legs    Sciatic leg pain    Sleep apnea    does not use cpap   Snores    a. presumed OSA, pt has refused sleep eval in past.   Type II diabetes mellitus ([place])    no longer on medications, checks blood glucose at home   Urinary frequency    Uses wheelchair    Walker as ambulation aid     Past Surgical History:  Procedure Laterality Date   A/V FISTULAGRAM Left 01/10/2021   Procedure: A/V FISTULAGRAM;  Surgeon: Waynetta Sandy, MD;  Location: [place] [facility];  Service: Cardiovascular;  Laterality: Left;   A/V FISTULAGRAM Left 05/19/2021  Procedure: A/V Fistulagram;  Surgeon: Marty Heck, MD;  Location: [place] [facility];  Service: Cardiovascular;  Laterality: Left;   ABDOMINAL AORTOGRAM W/LOWER EXTREMITY Bilateral 08/21/2019   Procedure: ABDOMINAL AORTOGRAM W/LOWER EXTREMITY;  Surgeon: Waynetta Sandy, MD;  Location: [place] [facility];  Service: Cardiovascular;  Laterality: Bilateral;   AMPUTATION Right 07/25/2019   Procedure: RIGHT 5th RAY AMPUTATION;  Surgeon: Newt Minion, MD;  Location: [place];  Service: Orthopedics;  Laterality: Right;   AMPUTATION Right 11/19/2019   Procedure: RIGHT TRANSMETATARSAL AMPUTATION;  Surgeon: Newt Minion, MD;  Location: [place];  Service: Orthopedics;  Laterality: Right;   ANGIOPLASTY / STENTING  FEMORAL Left 12/11/2013   dr berry   AV FISTULA PLACEMENT Left 03/19/2014   Procedure: CREATION OF ARTERIOVENOUS (AV) FISTULA  LEFT UPPER ARM;  Surgeon: Mal Misty, MD;  Location: [place];  Service: Vascular;  Laterality: Left;   AV FISTULA PLACEMENT Left 11/09/2020   Procedure: LEFT ARM ARTERIOVENOUS (AV) FISTULA CREATION;  Surgeon: Waynetta Sandy, MD;  Location: [place];  Service: Vascular;  Laterality: Left;   AV FISTULA PLACEMENT Left 02/15/2021   Procedure: INSERTION OF LEFT ARM ARTERIOVENOUS GORE-TEX GRAFT;  Surgeon: Waynetta Sandy, MD;  Location: [place];  Service: Vascular;  Laterality: Left;   BACK SURGERY  01/2018   screws placed    [place] Left 12/14/2020   Procedure: REVISION OF LEFT ARM SECOND STAGE BASILIC VEIN TRANSPOSITION;  Surgeon: Waynetta Sandy, MD;  Location: [place];  Service: Vascular;  Laterality: Left;   CARDIAC CATHETERIZATION  2001 and 2010    COLONOSCOPY W/ BIOPSIES AND POLYPECTOMY     COLONOSCOPY WITH PROPOFOL N/A 08/01/2016   Procedure: COLONOSCOPY WITH PROPOFOL;  Surgeon: Carol Ada, MD;  Location: [facility];  Service: Endoscopy;  Laterality: N/A;   CORONARY ARTERY BYPASS GRAFT  2019   [facility] x 1 bypass   ESOPHAGOGASTRODUODENOSCOPY (EGD) WITH PROPOFOL N/A 08/01/2016   Procedure: ESOPHAGOGASTRODUODENOSCOPY (EGD) WITH PROPOFOL;  Surgeon: Carol Ada, MD;  Location: [facility];  Service: Endoscopy;  Laterality: N/A;   FISTULA SUPERFICIALIZATION Left 02/10/2020   Procedure: 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;  Surgeon: Rosetta Posner, MD;  Location: [place];  Service: Vascular;  Laterality: Left;   FISTULA SUPERFICIALIZATION Left 03/23/2020   Procedure: LEFT UPPER EXTREMITY ARTERIOVENOUS FISTULA PLICATION;  Surgeon: Rosetta Posner, MD;  Location: [place];  Service: Vascular;  Laterality: Left;   FOOT FRACTURE SURGERY Right    ligament repair   FRACTURE SURGERY     left forearm   GRAFT APPLICATION  Right 0/12/7351   Procedure: FAT GRAFT APPLICATION;  Surgeon: Evelina Bucy, DPM;  Location: [place];  Service: Podiatry;  Laterality: Right;   HEMATOMA EVACUATION Left 11/09/2020   Procedure: EVACUATION HEMATOMA LEFT ARM;  Surgeon: Angelia Mould, MD;  Location: [facility] OR;  Service: Vascular;  Laterality: Left;   INGUINAL HERNIA REPAIR Left    IR REMOVAL TUN CV CATH W/O FL  06/16/2021   LEFT HEART CATH AND CORS/GRAFTS ANGIOGRAPHY N/A 04/27/2017   Procedure: LEFT HEART CATH AND CORS/GRAFTS ANGIOGRAPHY;  Surgeon: Leonie Man, MD;  Location: [employer] [facility];  Service: Cardiovascular;  Laterality: N/A;   LEFT HEART CATHETERIZATION WITH CORONARY ANGIOGRAM N/A 06/22/2014   Procedure: LEFT HEART CATHETERIZATION WITH CORONARY ANGIOGRAM;  Surgeon: Troy Sine, MD;  Location: [facility] CATH LAB;  Service: Cardiovascular;  Laterality: N/A;   LOWER EXTREMITY ANGIOGRAM Left 12/11/2013  Procedure: LOWER EXTREMITY ANGIOGRAM;  Surgeon: Lorretta Harp, MD;  Location: [facility] CATH LAB;  Service: Cardiovascular;  Laterality: Left;   LUMBAR LAMINECTOMY/DECOMPRESSION MICRODISCECTOMY Right 07/03/2017   Procedure: MICRODISCECTOMY LUMBAR FIVE - SACRAL ONE RIGHT;  Surgeon: Consuella Lose, MD;  Location: [place];  Service: Neurosurgery;  Laterality: Right;   LUMBAR LAMINECTOMY/DECOMPRESSION MICRODISCECTOMY Right 10/19/2017   Procedure: MICRODISCECTOMY LUMBAR FIVE- SACRAL 1 ONE ;  Surgeon: Consuella Lose, MD;  Location: [place];  Service: Neurosurgery;  Laterality: Right;   PERIPHERAL VASCULAR BALLOON ANGIOPLASTY Left 05/19/2021   Procedure: PERIPHERAL VASCULAR BALLOON ANGIOPLASTY;  Surgeon: Marty Heck, MD;  Location: [place] [facility];  Service: Cardiovascular;  Laterality: Left;   PERIPHERAL VASCULAR INTERVENTION Left 01/10/2021   Procedure: PERIPHERAL VASCULAR INTERVENTION;  Surgeon: Waynetta Sandy, MD;  Location: [place] [facility];  Service: Cardiovascular;  Laterality: Left;    THROMBECTOMY W/ EMBOLECTOMY Left 11/09/2020   Procedure: THROMBECTOMY OF LEFT ARTERIOVENOUS FISTULA;  Surgeon: Angelia Mould, MD;  Location: [place];  Service: Vascular;  Laterality: Left;   TONSILLECTOMY AND ADENOIDECTOMY     WISDOM TOOTH EXTRACTION     WOUND DEBRIDEMENT Right 05/13/2019   Procedure: [place];  Surgeon: Evelina Bucy, DPM;  Location: [place];  Service: Podiatry;  Laterality: Right;    Allergies: Contrast media [iodinated contrast media], Kiwi extract, Flexeril [cyclobenzaprine], and Tape  Medications: Prior to Admission medications   Medication Sig Start Date End Date Taking? Authorizing Provider  acetaminophen (TYLENOL) 500 MG tablet Take 1,000 mg by mouth every 6 (six) hours as needed for moderate pain.   Yes [provider]  aspirin EC 81 MG tablet Take 1 tablet (81 mg total) by mouth daily. 05/28/12  Yes Richardson Dopp T, PA-C  atorvastatin (LIPITOR) 40 MG tablet Take 1 tablet (40 mg total) by mouth daily at 6 PM. 12/01/19  Yes Angiulli, Lavon Paganini, PA-C  CALCITRIOL PO Take 1 tablet by mouth every Monday, Wednesday, and Friday.   Yes [provider]  calcium acetate (PHOSLO) 667 MG capsule Take 2 capsules (1,334 mg total) by mouth 2 (two) times daily with a meal. 12/01/19  Yes Angiulli, Lavon Paganini, PA-C  clopidogrel (PLAVIX) 75 MG tablet Take 75 mg by mouth daily. 12/01/19  Yes [provider]  Etelcalcetide HCl (PARSABIV IV) Etelcalcetide Hermina Staggers) 08/03/21 08/02/22 Yes [provider]  gabapentin (NEURONTIN) 100 MG capsule Take 100-200 mg by mouth See admin instructions. 100 mg in the morning, 200 mg in the evening 11/28/19  Yes [provider]  midodrine (PROAMATINE) 10 MG tablet Take 10 mg by mouth in the morning and at bedtime. 12/01/19  Yes [provider]  multivitamin (RENA-VIT) TABS tablet Take 1 tablet by mouth at bedtime. 12/01/19  Yes Angiulli, Lavon Paganini, PA-C  pantoprazole (PROTONIX)  40 MG tablet Take 40 mg by mouth daily.   Yes [provider]  predniSONE (DELTASONE) 50 MG tablet Take one tablet at 13 hours, 7 hours and 1 hour prior to your procedure. 11/10/21  Yes Covington, Roselyn Reef R, NP  loperamide (IMODIUM) 2 MG capsule Take 2 mg by mouth as needed for diarrhea or loose stools.    [provider]  [facility] ULTRA test strip See admin instructions. 01/14/21   [provider]  senna-docusate (SENOKOT-S) 8.6-50 MG tablet Take 2 tablets by mouth 2 (two) times daily as needed for mild constipation.    [provider]     Family History  Problem Relation  Age of Onset   Heart attack Sister        died @ 10   Cancer Mother        died @ 56; unknown type   Diabetes Brother        deceased   Cirrhosis Father        alcohol related   Diabetes Father    Esophageal cancer Neg Hx    Colon cancer Neg Hx    Pancreatic cancer Neg Hx    Stomach cancer Neg Hx     Social History   Socioeconomic History   Marital status: Married    Spouse name: Not on file   Number of children: Not on file   Years of education: Not on file   Highest education level: Not on file  Occupational History   Occupation: retired  Tobacco Use   Smoking status: Former    Packs/day: 1.00    Years: 2.00    Total pack years: 2.00    Types: Cigarettes    Quit date: 1978    Years since quitting: 45.6   Smokeless tobacco: Never  Vaping Use   Vaping Use: Never used  Substance and Sexual Activity   Alcohol use: Not Currently    Alcohol/week: 0.0 standard drinks of alcohol    Comment: h/o social drinking   Drug use: Not Currently    Types: Marijuana    Comment: quit 1986   Sexual activity: Yes    Birth control/protection: None  Other Topics Concern   Not on file  Social History Narrative   The patient is a Retail buyer.  He is married and has 2 grown children.  Lives in [place] with his wife.  Denies tobacco, alcohol or IV drug abuse or marijuana or cocaine intake.     Social Determinants of Health   Financial Resource Strain: Not on file  Food Insecurity: Not on file  Transportation Needs: Not on file  Physical Activity: Not on file  Stress: Not on file  Social Connections: Not on file    Review of Systems: A 12 point ROS discussed and pertinent positives are indicated in the HPI above.  All other systems are negative.  Review of Systems  Constitutional:  Negative for activity change, appetite change, fatigue and fever.  Respiratory:  Negative for cough and shortness of breath.   Cardiovascular:  Negative for chest pain.  Gastrointestinal:  Negative for abdominal pain, diarrhea, nausea and vomiting.  Musculoskeletal:  Negative for back pain.  Neurological:  Negative for weakness.  Psychiatric/Behavioral:  Negative for behavioral problems and confusion.     Vital Signs: BP 124/74   Pulse 86   Temp 99.1 F (37.3 C) (Oral)   Resp 18   Ht '5\' 9"'$  (1.753 m)   Wt 172 lb (78 kg)   SpO2 98%   BMI 25.40 kg/m    Physical Exam Vitals reviewed.  HENT:     Mouth/Throat:     Mouth: Mucous membranes are moist.  Cardiovascular:     Rate and Rhythm: Normal rate and regular rhythm.     Heart sounds: Normal heart sounds.  Pulmonary:     Effort: Pulmonary effort is normal.     Breath sounds: Normal breath sounds.  Abdominal:     Palpations: Abdomen is soft.     Tenderness: There is no abdominal tenderness.  Musculoskeletal:        General: Normal range of motion.     Comments: FROM Left arm fistula  Pulse good Thrill weak  Skin:    General: Skin is warm.  Neurological:     Mental Status: He is alert and oriented to person, place, and time.  Psychiatric:        Behavior: Behavior normal.     Imaging: No results found.  Labs:  CBC: Recent Labs    12/14/20 0624 01/10/21 1117 02/15/21 0624 05/19/21 0919  HGB 15.0 13.9 16.3 15.6  HCT 44.0 41.0 48.0 46.0    COAGS: No results for input(s): "INR", "APTT" in the last 8760  hours.  BMP: Recent Labs    12/14/20 0624 01/10/21 1117 02/15/21 0624 05/19/21 0919  NA 136 138 136 137  K 4.1 4.6 3.9 4.7  CL 94* 97* 97* 96*  GLUCOSE 119* 113* 137* 102*  BUN 23 41* 25* 27*  CREATININE 7.40* 11.40* 9.10* 8.90*    LIVER FUNCTION TESTS: No results for input(s): "BILITOT", "AST", "ALT", "ALKPHOS", "PROT", "ALBUMIN" in the last 8760 hours.  TUMOR MARKERS: No results for input(s): "AFPTM", "CEA", "CA199", "CHROMGRNA" in the last 8760 hours.  Assessment and Plan:  Left upper arm dialysis fistula evaluation Possible tunneled dialysis catheter placement if needed Pt is aware of procedure benefits and risks Including but not limited to Infection; bleeding; damage to structure and those in vicinity He is aware and agreeable to proceed Consent signed and in chart  Thank you for this interesting consult.  I greatly enjoyed meeting GRAYSIN LUCZYNSKI and look forward to participating in their care.  A copy of this report was sent to the requesting provider on this date.  Electronically Signed: Lavonia Drafts, PA-C 11/15/2021, 9:47 AM   I spent a total of  30 Minutes   in face to face in clinical consultation, greater than 50% of which was counseling/coordinating care for left arm fistula evaluation/intervention

## 2021-11-15 NOTE — Progress Notes (Signed)
Pt transported from IR to 2M04 being bagged. Pt's vital remained stable through transport. Handoff to Amy M RRT at bedside.

## 2021-11-15 NOTE — Consult Note (Signed)
NAME:  Jon Anderson, MRN:  [id number], DOB:  Nov 27, 1950, LOS: 0 ADMISSION DATE:  11/15/2021, CONSULTATION DATE:  8/8 REFERRING MD:  Dr. Dwaine Gale IR, CHIEF COMPLAINT:  Cardiac arrest   History of Present Illness:  71 year old male with PMH as below which is significant for end-stage renal disease on dialysis, carotid stenosis, peripheral vascular disease, diabetes type 2, and hypertension.  He has had multiple issues with his dialysis access over the years and is now being dialyzed via left upper extremity fistula.  I have recent dialysis sessions he has noted to have slow arterial flow and he was referred to interventional radiology for fistula evaluation.  He was electively taken for this procedure on 8/8.  The procedure was seemingly going well, however, during the case he will woke up complaining abdominal pain and subsequently suffered cardiac arrest.  They are able to achieve ROSC after 1 round of CPR/ACLS.  Postcode the patient remained in shock requiring norepinephrine infusion.  He became agitated requiring additional sedation.  Also noted to have tachyarrhythmia with nonsustained runs of ventricular tachycardia treated with amiodarone.  PCCM was consulted.  Pertinent  Medical History   has a past medical history of Anemia of chronic disease, Arthritis, CAD (coronary artery disease), Carotid stenosis, Chronic chest pain, Constipation, Dyspnea, ESRD (end stage renal disease) on dialysis ([place]), GERD (gastroesophageal reflux disease), History of hiatal hernia, History of kidney stones, HNP (herniated nucleus pulposus), lumbar, HTN (hypertension), Hyperlipidemia, Nephrolithiasis, Peripheral arterial disease ([place]), Pneumonia (yrs ago), Restless legs, Sciatic leg pain, Sleep apnea, Snores, Type II diabetes mellitus ([place]), Urinary frequency, Uses wheelchair, and Walker as ambulation aid.   Significant Hospital Events: Including procedures, antibiotic start and stop dates in addition to other pertinent  events   8/8 code in IR during elective fistulagram > admit to ICU on vent, pressors.   Interim History / Subjective:  Patient is encephalopathic and/or intubated. Therefore history has been obtained from chart review.    Objective   Blood pressure (!) 135/90, pulse 69, temperature 99.1 F (37.3 C), temperature source Oral, resp. rate 16, height '5\' 9"'$  (1.753 m), weight 78 kg, SpO2 100 %.       No intake or output data in the 24 hours ending 11/15/21 1336 Filed Weights   11/15/21 0804  Weight: 78 kg    Examination: General: Thin elderly appearing male on vent HENT: [place]/AT, PERRL, no JVD Lungs: Clear, bilateral, breath sounds Cardiovascular: RRR, no MRG. +thrill and bruit LUE fistula Abdomen: Soft, non-tender, non-distended Extremities: R transmetatarsal amputation.  Neuro: Sedated with occasional agitation.   Resolved Hospital Problem list     Assessment & Plan:   Cardiac arrest: in hospital PEA. Etiology unclear. Happened during elective IR case. Complained of abdominal pain just prior to arrest. One round of CPR.  - CT abdomen/pelvis - check troponin - EKG non-ischemic - Check full panel of labs - K 2.7, but HD yesterday. 10 meq given peri-arrest  ESRD on HD: MWF, last tx 8/7 Hypokalemia Hypomag - Give 10 additional meq of potassium - Give 2g mag - Hold off on nephro consult for now  Shock: likely cardiogenic shock post arrest, stunned myocardium - rapidly weaning off pressors - NE titrated to MAP 65, should likely be off within a few minutes - Echocardiogram - continue home midodrine once able to take PO.   Anemia - no signs of bleeding. Trend hgb.   PVD - holding ASA and plavix pending CT abdomen/pelvis  DM2 -  CBG monitoring and SSI  Best Practice (right click and "Reselect all SmartList Selections" daily)   Diet/type: NPO DVT prophylaxis: prophylactic heparin  GI prophylaxis: PPI Lines: Central line Foley:  N/A Code Status:  full code Last date  of multidisciplinary goals of care discussion '[ ]'$   Labs   CBC: Recent Labs  Lab 11/15/21 1314  WBC 9.0  HGB 12.4*  HCT 36.7*  MCV 92.9  PLT 696    Basic Metabolic Panel: No results for input(s): "NA", "K", "CL", "CO2", "GLUCOSE", "BUN", "CREATININE", "CALCIUM", "MG", "PHOS" in the last 168 hours. GFR: CrCl cannot be calculated (Patient's most recent lab result is older than the maximum 21 days allowed.). Recent Labs  Lab 11/15/21 1314  WBC 9.0    Liver Function Tests: No results for input(s): "AST", "ALT", "ALKPHOS", "BILITOT", "PROT", "ALBUMIN" in the last 168 hours. No results for input(s): "LIPASE", "AMYLASE" in the last 168 hours. No results for input(s): "AMMONIA" in the last 168 hours.  ABG    Component Value Date/Time   PHART 7.430 03/18/2017 1220   PCO2ART 49.2 (H) 03/18/2017 1220   PO2ART 71.7 (L) 03/18/2017 1220   HCO3 32.0 (H) 03/18/2017 1220   TCO2 30 05/19/2021 0919   O2SAT 92.7 03/18/2017 1220     Coagulation Profile: No results for input(s): "INR", "PROTIME" in the last 168 hours.  Cardiac Enzymes: No results for input(s): "CKTOTAL", "CKMB", "CKMBINDEX", "TROPONINI" in the last 168 hours.  HbA1C: Hgb A1c MFr Bld  Date/Time Value Ref Range Status  07/24/2019 07:33 PM 6.4 (H) 4.8 - 5.6 % Final    Comment:    (NOTE) Pre diabetes:          5.7%-6.4% Diabetes:              >6.4% Glycemic control for   <7.0% adults with diabetes   07/18/2018 02:14 PM 6.5 (H) 4.8 - 5.6 % Final    Comment:    (NOTE) Pre diabetes:          5.7%-6.4% Diabetes:              >6.4% Glycemic control for   <7.0% adults with diabetes     CBG: Recent Labs  Lab 11/10/21 0936  GLUCAP 124*    Review of Systems:   Patient is encephalopathic and/or intubated. Therefore history has been obtained from chart review.   Past Medical History:  He,  has a past medical history of Anemia of chronic disease, Arthritis, CAD (coronary artery disease), Carotid stenosis,  Chronic chest pain, Constipation, Dyspnea, ESRD (end stage renal disease) on dialysis ([place]), GERD (gastroesophageal reflux disease), History of hiatal hernia, History of kidney stones, HNP (herniated nucleus pulposus), lumbar, HTN (hypertension), Hyperlipidemia, Nephrolithiasis, Peripheral arterial disease ([place]), Pneumonia (yrs ago), Restless legs, Sciatic leg pain, Sleep apnea, Snores, Type II diabetes mellitus ([place]), Urinary frequency, Uses wheelchair, and Walker as ambulation aid.   Surgical History:   Past Surgical History:  Procedure Laterality Date   A/V FISTULAGRAM Left 01/10/2021   Procedure: A/V FISTULAGRAM;  Surgeon: Waynetta Sandy, MD;  Location: [place] [facility];  Service: Cardiovascular;  Laterality: Left;   A/V FISTULAGRAM Left 05/19/2021   Procedure: A/V Fistulagram;  Surgeon: Marty Heck, MD;  Location: [place] [facility];  Service: Cardiovascular;  Laterality: Left;   ABDOMINAL AORTOGRAM W/LOWER EXTREMITY Bilateral 08/21/2019   Procedure: ABDOMINAL AORTOGRAM W/LOWER EXTREMITY;  Surgeon: Waynetta Sandy, MD;  Location: [place] [facility];  Service: Cardiovascular;  Laterality: Bilateral;  AMPUTATION Right 07/25/2019   Procedure: RIGHT 5th RAY AMPUTATION;  Surgeon: Newt Minion, MD;  Location: [place];  Service: Orthopedics;  Laterality: Right;   AMPUTATION Right 11/19/2019   Procedure: RIGHT TRANSMETATARSAL AMPUTATION;  Surgeon: Newt Minion, MD;  Location: [place];  Service: Orthopedics;  Laterality: Right;   ANGIOPLASTY / STENTING FEMORAL Left 12/11/2013   dr berry   AV FISTULA PLACEMENT Left 03/19/2014   Procedure: CREATION OF ARTERIOVENOUS (AV) FISTULA  LEFT UPPER ARM;  Surgeon: Mal Misty, MD;  Location: [place];  Service: Vascular;  Laterality: Left;   AV FISTULA PLACEMENT Left 11/09/2020   Procedure: LEFT ARM ARTERIOVENOUS (AV) FISTULA CREATION;  Surgeon: Waynetta Sandy, MD;  Location: [place];  Service: Vascular;  Laterality: Left;    AV FISTULA PLACEMENT Left 02/15/2021   Procedure: INSERTION OF LEFT ARM ARTERIOVENOUS GORE-TEX GRAFT;  Surgeon: Waynetta Sandy, MD;  Location: [place];  Service: Vascular;  Laterality: Left;   BACK SURGERY  01/2018   screws placed    [place] Left 12/14/2020   Procedure: REVISION OF LEFT ARM SECOND STAGE BASILIC VEIN TRANSPOSITION;  Surgeon: Waynetta Sandy, MD;  Location: [place];  Service: Vascular;  Laterality: Left;   CARDIAC CATHETERIZATION  2001 and 2010    COLONOSCOPY W/ BIOPSIES AND POLYPECTOMY     COLONOSCOPY WITH PROPOFOL N/A 08/01/2016   Procedure: COLONOSCOPY WITH PROPOFOL;  Surgeon: Carol Ada, MD;  [facility]: [facility];  Service: Endoscopy;  Laterality: N/A;   CORONARY ARTERY BYPASS GRAFT  2019   [facility] x [facility] bypass   ESOPHAGOGASTRODUODENOSCOPY (EGD) WITH PROPOFOL N/A 08/01/2016   Procedure: ESOPHAGOGASTRODUODENOSCOPY (EGD) WITH PROPOFOL;  Surgeon: Carol Ada, MD;  [facility];  Service: Endoscopy;  Laterality: N/A;   FISTULA SUPERFICIALIZATION Left 02/10/2020   Procedure: 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;  Surgeon: Rosetta Posner, MD;  Location: [place];  Service: Vascular;  Laterality: Left;   FISTULA SUPERFICIALIZATION Left 03/23/2020   Procedure: LEFT UPPER EXTREMITY ARTERIOVENOUS FISTULA PLICATION;  Surgeon: Rosetta Posner, MD;  Location: [place];  Service: Vascular;  Laterality: Left;   FOOT FRACTURE SURGERY Right    ligament repair   FRACTURE SURGERY     left forearm   GRAFT APPLICATION Right 09/15/6718   Procedure: FAT GRAFT APPLICATION;  Surgeon: Evelina Bucy, DPM;  Location: [place];  Service: Podiatry;  Laterality: Right;   HEMATOMA EVACUATION Left 11/09/2020   Procedure: EVACUATION HEMATOMA LEFT ARM;  Surgeon: Angelia Mould, MD;  [facility] OR;  Service: Vascular;  Laterality: Left;   INGUINAL HERNIA REPAIR Left    IR REMOVAL TUN CV CATH W/O FL  06/16/2021   LEFT HEART CATH  AND CORS/GRAFTS ANGIOGRAPHY N/A 04/27/2017   Procedure: LEFT HEART CATH AND CORS/GRAFTS ANGIOGRAPHY;  Surgeon: Leonie Man, MD;  Location: [place] [facility];  Service: Cardiovascular;  Laterality: N/A;   LEFT HEART CATHETERIZATION WITH CORONARY ANGIOGRAM N/A 06/22/2014   Procedure: LEFT HEART CATHETERIZATION WITH CORONARY ANGIOGRAM;  Surgeon: Troy Sine, MD;  Location[facility];  Service: Cardiovascular;  Laterality: N/A;   LOWER EXTREMITY ANGIOGRAM Left 12/11/2013   Procedure: LOWER EXTREMITY ANGIOGRAM;  Surgeon: Lorretta Harp, MD;  Location: [facility];  Service: Cardiovascular;  Laterality: Left;   LUMBAR LAMINECTOMY/DECOMPRESSION MICRODISCECTOMY Right 07/03/2017   Procedure: MICRODISCECTOMY LUMBAR FIVE - SACRAL ONE RIGHT;  Surgeon: Consuella Lose, MD;  Location: [place];  Service: Neurosurgery;  Laterality: Right;  LUMBAR LAMINECTOMY/DECOMPRESSION MICRODISCECTOMY Right 10/19/2017   Procedure: MICRODISCECTOMY LUMBAR FIVE- SACRAL 1 ONE ;  Surgeon: Consuella Lose, MD;  Location: [place];  Service: Neurosurgery;  Laterality: Right;   PERIPHERAL VASCULAR BALLOON ANGIOPLASTY Left 05/19/2021   Procedure: PERIPHERAL VASCULAR BALLOON ANGIOPLASTY;  Surgeon: Marty Heck, MD;  Location: [place] [facility];  Service: Cardiovascular;  Laterality: Left;   PERIPHERAL VASCULAR INTERVENTION Left 01/10/2021   Procedure: PERIPHERAL VASCULAR INTERVENTION;  Surgeon: Waynetta Sandy, MD;  Location: [place] [facility];  Service: Cardiovascular;  Laterality: Left;   THROMBECTOMY W/ EMBOLECTOMY Left 11/09/2020   Procedure: THROMBECTOMY OF LEFT ARTERIOVENOUS FISTULA;  Surgeon: Angelia Mould, MD;  Location: [place];  Service: Vascular;  Laterality: Left;   TONSILLECTOMY AND ADENOIDECTOMY     WISDOM TOOTH EXTRACTION     WOUND DEBRIDEMENT Right 05/13/2019   Procedure: [place];  Surgeon: Evelina Bucy, DPM;  Location: [place];  Service: Podiatry;   Laterality: Right;     Social History:   reports that he quit smoking about 45 years ago. His smoking use included cigarettes. He has a 2.00 pack-year smoking history. He has never used smokeless tobacco. He reports that he does not currently use alcohol. He reports that he does not currently use drugs after having used the following drugs: Marijuana.   Family History:  His family history includes Cancer in his mother; Cirrhosis in his father; Diabetes in his brother and father; Heart attack in his sister. There is no history of Esophageal cancer, Colon cancer, Pancreatic cancer, or Stomach cancer.   Allergies Allergies  Allergen Reactions   Contrast Media [Iodinated Contrast Media] Anaphylaxis    Throat swelling, hives, SOB   Kiwi Extract Itching, Swelling and Other (See Comments)    Lips and face swell- breathing not affected   Flexeril [Cyclobenzaprine]     Hands become flimsy, can not hold things   Tape Other (See Comments)    "Plastic" tape causes blisters!!     Home Medications  Prior to Admission medications   Medication Sig Start Date End Date Taking? Authorizing Provider  acetaminophen (TYLENOL) 500 MG tablet Take 1,000 mg by mouth every 6 (six) hours as needed for moderate pain.   Yes [provider]  aspirin EC 81 MG tablet Take 1 tablet (81 mg total) by mouth daily. 05/28/12  Yes Richardson Dopp T, PA-C  atorvastatin (LIPITOR) 40 MG tablet Take 1 tablet (40 mg total) by mouth daily at 6 PM. 12/01/19  Yes Angiulli, Lavon Paganini, PA-C  CALCITRIOL PO Take 1 tablet by mouth every Monday, Wednesday, and Friday.   Yes [provider]  calcium acetate (PHOSLO) 667 MG capsule Take 2 capsules (1,334 mg total) by mouth 2 (two) times daily with a meal. 12/01/19  Yes Angiulli, Lavon Paganini, PA-C  clopidogrel (PLAVIX) 75 MG tablet Take 75 mg by mouth daily. 12/01/19  Yes [provider]  Etelcalcetide HCl (PARSABIV IV) Etelcalcetide Hermina Staggers) 08/03/21 08/02/22 Yes [provider]  gabapentin (NEURONTIN) 100 MG capsule Take 100-200 mg by mouth See admin instructions. 100 mg in the morning, 200 mg in the evening 11/28/19  Yes [provider]  midodrine (PROAMATINE) 10 MG tablet Take 10 mg by mouth in the morning and at bedtime. 12/01/19  Yes [provider]  multivitamin (RENA-VIT) TABS tablet Take 1 tablet by mouth at bedtime. 12/01/19  Yes Angiulli, Lavon Paganini, PA-C  pantoprazole (PROTONIX) 40 MG tablet Take 40 mg by mouth daily.  Yes [provider]  predniSONE (DELTASONE) 50 MG tablet Take one tablet at 13 hours, 7 hours and 1 hour prior to your procedure. 11/10/21  Yes Covington, Roselyn Reef R, NP  loperamide (IMODIUM) 2 MG capsule Take 2 mg by mouth as needed for diarrhea or loose stools.    [provider]  [facility] ULTRA test strip See admin instructions. 01/14/21   [provider]  senna-docusate (SENOKOT-S) 8.6-50 MG tablet Take 2 tablets by mouth 2 (two) times daily as needed for mild constipation.    [provider]     Critical care time: 46 minutes     Georgann Housekeeper, AGACNP-BC [place]  See Amion for personal pager PCCM on call pager [phone] until 7pm. Please call Elink 7p-7a. [phone]  11/15/2021 2:09 PM

## 2021-11-15 NOTE — Sedation Documentation (Signed)
Suddenly patient stated "My stomach hurts" asked is he has nausea said "Yes"

## 2021-11-15 NOTE — Progress Notes (Signed)
RT NOTE: ETT pulled back 3cm per CCM order per chest xray. Vitals are stable. RT will continue to monitor.

## 2021-11-15 NOTE — Code Documentation (Signed)
  Patient Name: Jon Anderson   MRN: [id number]   Date of Birth/ Sex: 1951/03/21 , male      Admission Date: 11/15/2021  Attending Provider: Laurin Coder, MD  Primary Diagnosis: Cardiac arrest [facility])   Indication: Pt was in his usual state of health until this PM, when he was noted to be unresponsive and pulseless. Code blue was subsequently called. At the time of arrival on scene, ACLS protocol was underway. Patient had received 1 round of CPR and ROSC. He then went back into PEA arrest. CPR was continued for one round and ROSC was then obtained.    Technical Description:  - CPR performance duration:  5 minute  - Was defibrillation or cardioversion used? No   - Was external pacer placed? No  - Was patient intubated pre/post CPR? Yes   Medications Administered: Y = Yes; Blank = No Amiodarone  x  Atropine    Calcium    Epinephrine  x  Lidocaine    Magnesium    Norepinephrine    Phenylephrine    Sodium bicarbonate  x  Vasopressin     Post CPR evaluation:  - Final Status - Was patient successfully resuscitated ? Yes - What is current rhythm? SVT - What is current hemodynamic status? Yes  Miscellaneous Information:  - Labs sent, including: CBC,CMP, LA, Troponin  - Primary team notified?  Patient present for elective procedure, PCCM consulted  - Family Notified? Yes  - Additional notes/ transfer status: Transferred to 42M under ICU care     Riesa Pope, MD  11/15/2021, 2:41 PM

## 2021-11-15 NOTE — Significant Event (Addendum)
Rapid Response Event Note   Reason for Call :  Code Blue in IR  Initial Focused Assessment:  On arrival, pt with ROSC, HR 120s, pulse thready. PEA arrested again a few mins later with 1 epi and hCO3 given. Fluids wide open. ROSC achieved after approx 3 mins. Pulse continues to be weak/thready, HR elevated to 180s. Intubated by anesthesia. Neo stick given for BP. CVC placed to L fem and Art line to R radial by Dr Dwaine Gale. Pt intermittently waking up thrashing. Versed and Fent given. Levo started, 10 Meq of K started for K of 2.9. '150mg'$  Amio given for ventricular tachyarrythmia. Pt transferred to 2M04 with this RN, RT and IR team.   See Code Sheet for full event log.   Event Summary:   MD Notified:  Call Time: [place] Time: 8338 End Time: [place]  Sherilyn Dacosta, RN

## 2021-11-15 NOTE — Anesthesia Procedure Notes (Signed)
Procedure Name: Intubation Date/Time: 11/15/2021 1:00 PM  Performed by: Lavell Luster, CRNAPre-anesthesia Checklist: Patient identified, Emergency Drugs available, Suction available, Patient being monitored and Timeout performed Patient Re-evaluated:Patient Re-evaluated prior to induction Oxygen Delivery Method: Ambu bag Preoxygenation: Pre-oxygenation with 100% oxygen Induction Type: IV induction Ventilation: Mask ventilation without difficulty Laryngoscope Size: Mac, 4 and Glidescope Grade View: Grade I Tube type: Oral Tube size: 8.0 mm Number of attempts: 1 Airway Equipment and Method: Stylet and Video-laryngoscopy Placement Confirmation: ETT inserted through vocal cords under direct vision, positive ETCO2 and breath sounds checked- equal and bilateral Secured at: 23 cm Tube secured with: Tape Dental Injury: Teeth and Oropharynx as per pre-operative assessment

## 2021-11-15 NOTE — Progress Notes (Signed)
   11/15/21 1252  Clinical Encounter Type  Visited With Patient not available;Health care provider  Visit Type Code;Critical Care;Initial  Referral From Physician  Consult/Referral To Chaplain Melvenia Beam)  Recommendations CODE BLUE   Chaplain responded to Code Blue Page in Radiology Procedure Room 1.  Medical staff providing treatment, therefore patient not seen. No Family Present at this time. Family contacted by Physician and [facility]. Case passed to [facility] prior to family arrival. Susanne Greenhouse., [phone]

## 2021-11-15 NOTE — Consult Note (Signed)
Renal Service Consult Note [facility]  Jon Anderson 11/15/2021 Sol Blazing, MD Requesting Physician: Dr. Ander Slade  Reason for Consult: ESRD pt sp PEA arrest during fistulogram today HPI: The patient is a 71 y.o. year-old w/ hx of CAD, ESRD on HD, HTN, HL, PAD, RLS, OSA who was undergoing fistulogram today in IR here (hx of slow flow recently, had similar procedure done in Feb 2022 by [facility]) when he woke up c/o abd pain and subsequently suffered cardiac arrest. He rec'd 1 round of ACLS/ CPR then had ROSC. Post code required levo gtt support for hypotension. Pt was intubated and moved to ICU. PCCM consulted. We are asked to see for dialysis.   Per RN pt had OP dialysis yesterday at OP unit on schedule. Pt unable to answer questions, sedated on the vent.   ROS - n/a  Past Medical History  Past Medical History:  Diagnosis Date   Anemia of chronic disease    Arthritis    CAD (coronary artery disease)    a.  Myoview 4/11: EF 53%, no scar or ischemia   c. MV 2012 Nl perfusion, apical thinning.  No ischemia or scar.  EF 49%, appears greater by visual estimate.;  d.  Dob stress echo 12/13:  Negative Dob stress echo. There is no evidence of ischemia.  The LVF is normal. b. Normal cors 2016.   Carotid stenosis    a. <17% RICA, >79% LICA by duplex 06/9028   Chronic chest pain    occ   Constipation    chronic   Dyspnea    occasional with extertion   ESRD (end stage renal disease) on dialysis ([place])    M-W-F- [facility]   GERD (gastroesophageal reflux disease)    History of hiatal hernia    History of kidney stones    Passed   HNP (herniated nucleus pulposus), lumbar    HTN (hypertension)    echo 3/10: EF 60%, LAE   Hyperlipidemia    Nephrolithiasis    "passed them all"   Peripheral arterial disease ([name])    a. s/p PTCA  right    Pneumonia yrs ago   Restless legs    Sciatic leg pain    Sleep apnea    does not use cpap   Snores    a. presumed OSA, pt has refused  sleep eval in past.   Type II diabetes mellitus ([place])    no longer on medications, checks blood glucose at home   Urinary frequency    Uses wheelchair    Walker as ambulation aid    Past Surgical History  Past Surgical History:  Procedure Laterality Date   A/V FISTULAGRAM Left 01/10/2021   Procedure: A/V FISTULAGRAM;  Surgeon: Waynetta Sandy, MD;  Location: [place] [facility];  Service: Cardiovascular;  Laterality: Left;   A/V FISTULAGRAM Left 05/19/2021   Procedure: A/V Fistulagram;  Surgeon: Marty Heck, MD;  Location: [employer] [facility];  Service: Cardiovascular;  Laterality: Left;   ABDOMINAL AORTOGRAM W/LOWER EXTREMITY Bilateral 08/21/2019   Procedure: ABDOMINAL AORTOGRAM W/LOWER EXTREMITY;  Surgeon: Waynetta Sandy, MD;  Location: [place] [facility];  Service: Cardiovascular;  Laterality: Bilateral;   AMPUTATION Right 07/25/2019   Procedure: RIGHT 5th RAY AMPUTATION;  Surgeon: Newt Minion, MD;  Location: [place];  Service: Orthopedics;  Laterality: Right;   AMPUTATION Right 11/19/2019   Procedure: RIGHT TRANSMETATARSAL AMPUTATION;  Surgeon: Newt Minion, MD;  Location: [place];  Service: Orthopedics;  Laterality: Right;   ANGIOPLASTY / STENTING FEMORAL Left 12/11/2013   dr berry   AV FISTULA PLACEMENT Left 03/19/2014   Procedure: CREATION OF ARTERIOVENOUS (AV) FISTULA  LEFT UPPER ARM;  Surgeon: Mal Misty, MD;  Location: [place];  Service: Vascular;  Laterality: Left;   AV FISTULA PLACEMENT Left 11/09/2020   Procedure: LEFT ARM ARTERIOVENOUS (AV) FISTULA CREATION;  Surgeon: Waynetta Sandy, MD;  Location: [place];  Service: Vascular;  Laterality: Left;   AV FISTULA PLACEMENT Left 02/15/2021   Procedure: INSERTION OF LEFT ARM ARTERIOVENOUS GORE-TEX GRAFT;  Surgeon: Waynetta Sandy, MD;  Location: [place];  Service: Vascular;  Laterality: Left;   BACK SURGERY  01/2018   screws placed    [place] Left 12/14/2020   Procedure:  REVISION OF LEFT ARM SECOND STAGE BASILIC VEIN TRANSPOSITION;  Surgeon: Waynetta Sandy, MD;  Location: [employer];  Service: Vascular;  Laterality: Left;   CARDIAC CATHETERIZATION  2001 and 2010    COLONOSCOPY W/ BIOPSIES AND POLYPECTOMY     COLONOSCOPY WITH PROPOFOL N/A 08/01/2016   Procedure: COLONOSCOPY WITH PROPOFOL;  Surgeon: Carol Ada, MD;  Location: [facility];  Service: Endoscopy;  Laterality: N/A;   CORONARY ARTERY BYPASS GRAFT  2019   [facility] x [facility] bypass   ESOPHAGOGASTRODUODENOSCOPY (EGD) WITH PROPOFOL N/A 08/01/2016   Procedure: ESOPHAGOGASTRODUODENOSCOPY (EGD) WITH PROPOFOL;  Surgeon: Carol Ada, MD;  Location: [facility];  Service: Endoscopy;  Laterality: N/A;   FISTULA SUPERFICIALIZATION Left 02/10/2020   Procedure: 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;  Surgeon: Rosetta Posner, MD;  Location: [place];  Service: Vascular;  Laterality: Left;   FISTULA SUPERFICIALIZATION Left 03/23/2020   Procedure: LEFT UPPER EXTREMITY ARTERIOVENOUS FISTULA PLICATION;  Surgeon: Rosetta Posner, MD;  Location: [place];  Service: Vascular;  Laterality: Left;   FOOT FRACTURE SURGERY Right    ligament repair   FRACTURE SURGERY     left forearm   GRAFT APPLICATION Right 06/12/3297   Procedure: FAT GRAFT APPLICATION;  Surgeon: Evelina Bucy, DPM;  Location: [place];  Service: Podiatry;  Laterality: Right;   HEMATOMA EVACUATION Left 11/09/2020   Procedure: EVACUATION HEMATOMA LEFT ARM;  Surgeon: Angelia Mould, MD;  Location[facility];  Service: Vascular;  Laterality: Left;   INGUINAL HERNIA REPAIR Left    IR AV DIALY SHUNT INTRO NEEDLE/INTRACATH INITIAL W/PTA/IMG LEFT  11/15/2021   IR REMOVAL TUN CV CATH W/O FL  06/16/2021   IR US GUIDE VASC ACCESS LEFT  11/15/2021   IR US GUIDE VASC ACCESS LEFT  11/15/2021   IR US GUIDE VASC ACCESS RIGHT  11/15/2021   LEFT HEART CATH AND CORS/GRAFTS ANGIOGRAPHY N/A 04/27/2017   Procedure: LEFT HEART CATH AND CORS/GRAFTS  ANGIOGRAPHY;  Surgeon: Leonie Man, MD;  Location: [place] [facility];  Service: Cardiovascular;  Laterality: N/A;   LEFT HEART CATHETERIZATION WITH CORONARY ANGIOGRAM N/A 06/22/2014   Procedure: LEFT HEART CATHETERIZATION WITH CORONARY ANGIOGRAM;  Surgeon: Troy Sine, MD;  Location: [facility] CATH LAB;  Service: Cardiovascular;  Laterality: N/A;   LOWER EXTREMITY ANGIOGRAM Left 12/11/2013   Procedure: LOWER EXTREMITY ANGIOGRAM;  Surgeon: Lorretta Harp, MD;  Location: [facility] LAB;  Service: Cardiovascular;  Laterality: Left;   LUMBAR LAMINECTOMY/DECOMPRESSION MICRODISCECTOMY Right 07/03/2017   Procedure: MICRODISCECTOMY LUMBAR FIVE - SACRAL ONE RIGHT;  Surgeon: Consuella Lose, MD;  Location: [place];  Service: Neurosurgery;  Laterality: Right;   LUMBAR LAMINECTOMY/DECOMPRESSION MICRODISCECTOMY Right  10/19/2017   Procedure: MICRODISCECTOMY LUMBAR FIVE- SACRAL 1 ONE ;  Surgeon: Consuella Lose, MD;  Location: [place];  Service: Neurosurgery;  Laterality: Right;   PERIPHERAL VASCULAR BALLOON ANGIOPLASTY Left 05/19/2021   Procedure: PERIPHERAL VASCULAR BALLOON ANGIOPLASTY;  Surgeon: Marty Heck, MD;  Location: [place] [facility];  Service: Cardiovascular;  Laterality: Left;   PERIPHERAL VASCULAR INTERVENTION Left 01/10/2021   Procedure: PERIPHERAL VASCULAR INTERVENTION;  Surgeon: Waynetta Sandy, MD;  Location: [place] [facility];  Service: Cardiovascular;  Laterality: Left;   THROMBECTOMY W/ EMBOLECTOMY Left 11/09/2020   Procedure: THROMBECTOMY OF LEFT ARTERIOVENOUS FISTULA;  Surgeon: Angelia Mould, MD;  Location: [place];  Service: Vascular;  Laterality: Left;   TONSILLECTOMY AND ADENOIDECTOMY     WISDOM TOOTH EXTRACTION     WOUND DEBRIDEMENT Right 05/13/2019   Procedure: [place];  Surgeon: Evelina Bucy, DPM;  Location: [place];  Service: Podiatry;  Laterality: Right;   Family History  Family History  Problem Relation Age of Onset   Heart  attack Sister        died @ 31   Cancer Mother        died @ 89; unknown type   Diabetes Brother        deceased   Cirrhosis Father        alcohol related   Diabetes Father    Esophageal cancer Neg Hx    Colon cancer Neg Hx    Pancreatic cancer Neg Hx    Stomach cancer Neg Hx    Social History  reports that he quit smoking about 45 years ago. His smoking use included cigarettes. He has a 2.00 pack-year smoking history. He has never used smokeless tobacco. He reports that he does not currently use alcohol. He reports that he does not currently use drugs after having used the following drugs: Marijuana. Allergies  Allergies  Allergen Reactions   Contrast Media [Iodinated Contrast Media] Anaphylaxis    Throat swelling, hives, SOB   Kiwi Extract Itching, Swelling and Other (See Comments)    Lips and face swell- breathing not affected   Flexeril [Cyclobenzaprine]     Hands become flimsy, can not hold things   Tape Other (See Comments)    "Plastic" tape causes blisters!!   Home medications Prior to Admission medications   Medication Sig Start Date End Date Taking? Authorizing Provider  acetaminophen (TYLENOL) 500 MG tablet Take 1,000 mg by mouth every 6 (six) hours as needed for moderate pain.   Yes [provider]  aspirin EC 81 MG tablet Take 1 tablet (81 mg total) by mouth daily. 05/28/12  Yes Richardson Dopp T, PA-C  atorvastatin (LIPITOR) 40 MG tablet Take 1 tablet (40 mg total) by mouth daily at 6 PM. 12/01/19  Yes Angiulli, Lavon Paganini, PA-C  CALCITRIOL PO Take 1 tablet by mouth every Monday, Wednesday, and Friday.   Yes [provider]  calcium acetate (PHOSLO) 667 MG capsule Take 2 capsules (1,334 mg total) by mouth 2 (two) times daily with a meal. 12/01/19  Yes Angiulli, Lavon Paganini, PA-C  clopidogrel (PLAVIX) 75 MG tablet Take 75 mg by mouth daily. 12/01/19  Yes [provider]  Etelcalcetide HCl (PARSABIV IV) Etelcalcetide Hermina Staggers) 08/03/21 08/02/22 Yes  [provider]  gabapentin (NEURONTIN) 100 MG capsule Take 100-200 mg by mouth See admin instructions. 100 mg in the morning, 200 mg in the evening 11/28/19  Yes [provider]  midodrine (PROAMATINE) 10 MG  tablet Take 10 mg by mouth in the morning and at bedtime. 12/01/19  Yes [provider]  multivitamin (RENA-VIT) TABS tablet Take 1 tablet by mouth at bedtime. 12/01/19  Yes Angiulli, Lavon Paganini, PA-C  pantoprazole (PROTONIX) 40 MG tablet Take 40 mg by mouth daily.   Yes [provider]  predniSONE (DELTASONE) 50 MG tablet Take one tablet at 13 hours, 7 hours and 1 hour prior to your procedure. 11/10/21  Yes Covington, Roselyn Reef R, NP  loperamide (IMODIUM) 2 MG capsule Take 2 mg by mouth as needed for diarrhea or loose stools.    [provider]  [facility] ULTRA test strip See admin instructions. 01/14/21   [provider]  senna-docusate (SENOKOT-S) 8.6-50 MG tablet Take 2 tablets by mouth 2 (two) times daily as needed for mild constipation.    [provider]     Vitals:   11/15/21 1645 11/15/21 1700 11/15/21 1715 11/15/21 1730  BP:  91/61    Pulse:   81 79  Resp: '18 18 18 18  '$ Temp:      TempSrc:      SpO2:   100% 100%  Weight:      Height:       Exam Gen alert, no distress No rash, cyanosis or gangrene Sclera anicteric, throat clear  No jvd or bruits Chest clear bilat to bases, no rales/ wheezing RRR no MRG Abd soft ntnd no mass or ascites +bs GU normal MS no joint effusions or deformity Ext no LE or UE edema, no wounds or ulcers Neuro is alert, Ox 3 , nf    LUA AVG+bruit      Home meds include - aspirin, atorvastatin, calcium acetate 2 ac, clopidogrel, gabapentin, midodrine 10 bid, renavite, pantoprazole, prns/ vits/ supps  BP 95/ 58, HR 80- 110, RR 18 temp 96  vent 0.40  CXR 8/8 - post arrest, mild basilar atx unchanged, no edema or vasc congestion   CT abd 8/8 - IMPRESSION: 1. No acute findings in the abdomen or  pelvis to explain the patient's pain 2. Collapse/consolidation in the right lower lobe. Difficult to exclude pneumonia. 3. Small left pleural effusion with left lower lobe subsegmental atelectasis. 4. Bilateral adrenal adenomas. 5. Trace ascites. 6. Prostate is mildly prominent.   OP HD: MWF [facility]  4h  450/ 1.5  78.5kg  2/2 bath P2  LUA AVG Hep 5000 - last HD 8/7, post wt was 78.5kg - last Hb 13.8 on 8/2, last pth 377 - calcitriol 0.75 ug tiw po   Assessment/ Plan: SP PEA arrest - occurred during an outpt fistulogram being done in IR. Post arrest pt intubated in ICU. Per staff his MS was good on arrival to ICU, sedated now.  Hypotension / shock - post arrest, is on levo gtt at 10 ug /min SP fistulogram - AVG is patent w/ good bruit post procedure. Should work for HD tomorrow.  ESRD - on HD MWF. Last HD was yesterday at OP unit. Plan HD tomorrow in ICU.  Hypokalemia - got 10 meq IV kcl. Will order another 2 runs IV. High K+ bath w/ HD tomorrow.  Hypomagnesemia - got 2 gm IV mag sulfate DM2 - on SSI Anemia esrd - Hb > 11, no esa needs MBD ckd - CCa a bit low, will cont vdra w/ HD. Phos is low as well, hold binder for now.       Kelly Splinter  MD 11/15/2021, 5:55 PM Recent Labs  Lab 11/15/21  1308 11/15/21 1314 11/15/21 1451  HGB 12.9* 12.4* 16.3  ALBUMIN  --  2.3*  --   CALCIUM  --  6.7*  --   PHOS  --  3.6  --   CREATININE 8.00* 7.88*  --   K 2.7* 2.8* 3.4*

## 2021-11-16 ENCOUNTER — Inpatient Hospital Stay (HOSPITAL_COMMUNITY): Payer: Medicare Other

## 2021-11-16 DIAGNOSIS — N186 End stage renal disease: Secondary | ICD-10-CM

## 2021-11-16 DIAGNOSIS — I469 Cardiac arrest, cause unspecified: Secondary | ICD-10-CM | POA: Diagnosis not present

## 2021-11-16 DIAGNOSIS — J9601 Acute respiratory failure with hypoxia: Secondary | ICD-10-CM | POA: Diagnosis not present

## 2021-11-16 DIAGNOSIS — I2510 Atherosclerotic heart disease of native coronary artery without angina pectoris: Secondary | ICD-10-CM

## 2021-11-16 LAB — CBC
HCT: 42.4 % (ref 39.0–52.0)
Hemoglobin: 14.6 g/dL (ref 13.0–17.0)
MCH: 30.8 pg (ref 26.0–34.0)
MCHC: 34.4 g/dL (ref 30.0–36.0)
MCV: 89.5 fL (ref 80.0–100.0)
Platelets: 254 10*3/uL (ref 150–400)
RBC: 4.74 MIL/uL (ref 4.22–5.81)
RDW: 14.3 % (ref 11.5–15.5)
WBC: 26.1 10*3/uL — ABNORMAL HIGH (ref 4.0–10.5)
nRBC: 0 % (ref 0.0–0.2)

## 2021-11-16 LAB — PHOSPHORUS: Phosphorus: 2 mg/dL — ABNORMAL LOW (ref 2.5–4.6)

## 2021-11-16 LAB — GLUCOSE, CAPILLARY
Glucose-Capillary: 256 mg/dL — ABNORMAL HIGH (ref 70–99)
Glucose-Capillary: 248 mg/dL — ABNORMAL HIGH (ref 70–99)
Glucose-Capillary: 180 mg/dL — ABNORMAL HIGH (ref 70–99)
Glucose-Capillary: 162 mg/dL — ABNORMAL HIGH (ref 70–99)
Glucose-Capillary: 318 mg/dL — ABNORMAL HIGH (ref 70–99)
Glucose-Capillary: 288 mg/dL — ABNORMAL HIGH (ref 70–99)
Glucose-Capillary: 299 mg/dL — ABNORMAL HIGH (ref 70–99)
Glucose-Capillary: 308 mg/dL — ABNORMAL HIGH (ref 70–99)

## 2021-11-16 LAB — LACTIC ACID, PLASMA: Lactic Acid, Venous: 1.7 mmol/L (ref 0.5–1.9)

## 2021-11-16 LAB — TRIGLYCERIDES: Triglycerides: 87 mg/dL

## 2021-11-16 LAB — TROPONIN I (HIGH SENSITIVITY)
Troponin I (High Sensitivity): 217 ng/L (ref <18)
Troponin I (High Sensitivity): 214 ng/L (ref <18)
Troponin I (High Sensitivity): 206 ng/L (ref <18)

## 2021-11-16 LAB — MAGNESIUM: Magnesium: 2.3 mg/dL (ref 1.7–2.4)

## 2021-11-16 LAB — BASIC METABOLIC PANEL
Anion gap: 16 — ABNORMAL HIGH (ref 5–15)
BUN: 50 mg/dL — ABNORMAL HIGH (ref 8–23)
CO2: 19 mmol/L — ABNORMAL LOW (ref 22–32)
Calcium: 9.4 mg/dL (ref 8.9–10.3)
Chloride: 93 mmol/L — ABNORMAL LOW (ref 98–111)
Creatinine, Ser: 10.52 mg/dL — ABNORMAL HIGH (ref 0.61–1.24)
GFR, Estimated: 5 mL/min — ABNORMAL LOW (ref >60)
Glucose, Bld: 246 mg/dL — ABNORMAL HIGH (ref 70–99)
Potassium: 5 mmol/L (ref 3.5–5.1)
Sodium: 128 mmol/L — ABNORMAL LOW (ref 135–145)

## 2021-11-16 MED ORDER — INSULIN ASPART 100 UNIT/ML IJ SOLN
0.0000 [IU] | INTRAMUSCULAR | Status: DC
  Administered 2021-11-16: 5 [IU] via SUBCUTANEOUS

## 2021-11-16 MED ORDER — NOREPINEPHRINE 16 MG/250ML-% IV SOLN
0.0000 ug/min | INTRAVENOUS | Status: DC
  Administered 2021-11-16: 10 ug/min via INTRAVENOUS
  Filled 2021-11-16: qty 250

## 2021-11-16 MED ORDER — MIDODRINE HCL 5 MG PO TABS
10.0000 mg | ORAL_TABLET | Freq: Two times a day (BID) | ORAL | Status: DC
  Administered 2021-11-16 – 2021-11-18 (×5): 10 mg via ORAL
  Filled 2021-11-16 (×5): qty 2

## 2021-11-16 MED ORDER — INSULIN ASPART 100 UNIT/ML IJ SOLN
0.0000 [IU] | Freq: Three times a day (TID) | INTRAMUSCULAR | Status: DC
  Administered 2021-11-16: 8 [IU] via SUBCUTANEOUS
  Administered 2021-11-17 (×2): 2 [IU] via SUBCUTANEOUS
  Administered 2021-11-17: 3 [IU] via SUBCUTANEOUS
  Administered 2021-11-17: 5 [IU] via SUBCUTANEOUS

## 2021-11-16 MED ORDER — ORAL CARE MOUTH RINSE: 15.0000 mL | OROMUCOSAL | Status: DC | PRN

## 2021-11-16 MED ORDER — INSULIN DETEMIR 100 UNIT/ML ~~LOC~~ SOLN
5.0000 [IU] | Freq: Every day | SUBCUTANEOUS | Status: DC
Start: 2021-11-16 — End: 2021-11-17
  Administered 2021-11-16: 5 [IU] via SUBCUTANEOUS
  Filled 2021-11-16 (×2): qty 0.05

## 2021-11-16 MED ORDER — POLYETHYLENE GLYCOL 3350 17 G PO PACK
17.0000 g | PACK | Freq: Every day | ORAL | Status: DC
  Filled 2021-11-16: qty 1

## 2021-11-16 MED ORDER — METHYLPREDNISOLONE SODIUM SUCC 40 MG IJ SOLR
40.0000 mg | Freq: Once | INTRAMUSCULAR | Status: AC
  Administered 2021-11-16: 40 mg via INTRAVENOUS
  Filled 2021-11-16: qty 1

## 2021-11-16 MED ORDER — DOCUSATE SODIUM 100 MG PO CAPS
100.0000 mg | ORAL_CAPSULE | Freq: Two times a day (BID) | ORAL | Status: DC
  Administered 2021-11-16 – 2021-11-17 (×4): 100 mg via ORAL
  Filled 2021-11-16 (×5): qty 1

## 2021-11-16 MED ORDER — AMPICILLIN-SULBACTAM SODIUM 1.5 (1-0.5) G IJ SOLR
1.5000 g | Freq: Two times a day (BID) | INTRAMUSCULAR | Status: DC
  Administered 2021-11-16 – 2021-11-17 (×3): 1.5 g via INTRAVENOUS
  Filled 2021-11-16 (×4): qty 4

## 2021-11-16 MED ORDER — INSULIN DETEMIR 100 UNIT/ML ~~LOC~~ SOLN
5.0000 [IU] | Freq: Every day | SUBCUTANEOUS | Status: DC
  Filled 2021-11-16: qty 0.05

## 2021-11-16 MED ORDER — PANTOPRAZOLE SODIUM 40 MG PO TBEC
40.0000 mg | DELAYED_RELEASE_TABLET | Freq: Every day | ORAL | Status: DC
  Administered 2021-11-16 – 2021-11-17 (×2): 40 mg via ORAL
  Filled 2021-11-16 (×2): qty 1

## 2021-11-16 NOTE — Progress Notes (Signed)
Received patient in bed to unit.  Alert and oriented.  Informed consent signed and in  chart.   Treatment initiated: 1430  Treatment completed: 1800  Patient tolerated well.  Transported back to the room  alert, without acute distress.  Hand-off given to patient's nurse.   Access used: avg Access issues: none  Total UF removed: 0 Medication(s) given: none Post HD VS: 129/99 82 96.8 100% Post HD weight: 70kg   [facility]

## 2021-11-16 NOTE — Progress Notes (Addendum)
NAME:  Jon Anderson, MRN:  [id number], DOB:  09-17-1950, LOS: 1 ADMISSION DATE:  11/15/2021, CONSULTATION DATE:  8/8 REFERRING MD:  Dr. Dwaine Gale IR, CHIEF COMPLAINT:  Cardiac arrest   History of Present Illness:  71 yo with PMH of ESRD on HD, PVD, DM, and HTN presented to [facility] for elective outpatient revision of left fistulogram due to slow arterial flow 8/8. During the case he woke up complaining of abdominal pain and then suffered cardiac arrest. ROSC was achieved after 1 round of ACLS. He required norepinephrine infusion post code. He required additional sedation and tachyarrhythmia with nonsustained runs of ventricular tachycardia treated with amiodarone. PCCM consulted.  Pertinent  Medical History  ESRD on HD PVD Carotid stenosis Type 2 DM  Significant Hospital Events: Including procedures, antibiotic start and stop dates in addition to other pertinent events   8/8 code in IR during elective fistulagram > admit to ICU on vent, pressors.  8/9 extubated  Interim History / Subjective:  Follows commands, denies abdominal pain or chest pain.  Objective   Blood pressure (!) 113/53, pulse 66, temperature 98.3 F (36.8 C), temperature source Oral, resp. rate 18, height '5\' 9"'$  (1.753 m), weight 78 kg, SpO2 100 %.    Vent Mode: PRVC FiO2 (%):  [30 %-100 %] 30 % Set Rate:  [18 bmp] 18 bmp Vt Set:  [560 mL] 560 mL PEEP:  [5 cmH20] 5 cmH20 Plateau Pressure:  [17 HAL93-79 cmH20] 18 cmH20   Intake/Output Summary (Last 24 hours) at 11/16/2021 0616 Last data filed at 11/16/2021 0600 Gross per 24 hour  Intake 989.29 ml  Output --  Net 989.29 ml   Filed Weights   11/15/21 0804  Weight: 78 kg    Examination: Constitutional: intubated, alert and follows commands HENT: ETT in place Cardiovascular: regular rate and rhythm, no m/r/g Pulmonary/Chest: mechanically ventilated Abdominal: soft, non-tender, non-distended MSK: normal bulk and tone Neurological: RASS 0 to -1 Skin: warm and  dry  Resolved Hospital Problem list   Hypokalemia hypomagnesemia  Assessment & Plan:   PEA cardiac arrest Patient esrd, dm, and prior LIMA graft with PEA cardiac arrest 8/8. Differentials include hypotension in setting of induction medications with anesthesia leading to ACS versus hypoxia. He did have electrolyte abnormalities including hypokalemia at 2.7 and hypo mag at 1.3. He was intubated after rosc was achieved. Will consult cardiology with history of heart disease. EKG showed q waves leads v1-3 that were unchanged from 04/22. Troponin elevated this AM at 217. -cards consulted -trend troponin  Concern for mesenteric ischemia CTA done without contrast. He complained of abdominal pain prior to PEA. Lactate was elevated following arrest to 5.9. repeat this am at 1.7. will hold off of cta w contrast with lactate returning to normal.  Nonsustained ventricular arrhythmia Rhythm occurred after ROSC -amiodarone bolus given yesterday   Shock- resolved Initial concern was for cardiogenic shock. Echo was reassuring with EF >70%. Home medications include midodrine 10 mg BID. -restart midodrine 10 mg BID -titrate off levophed as tolerated MAP > 65  Aspiration pneumonia CTA showed RLL possible consolidation, wbc increased from 9 to 26. In setting of code unsure if true infection. He did have increased secretion through ETT overnight. Will start empiric treatment for aspiration pneumonia. -unasyn for 5 days -respiratory culture  ESRD on HD: MWF last tx 8/7 Planning for HD in ICU today. Procedure was for slow flows during HD but AVG appears patent with good bruit. K at 5 with Mag 2.3. -  calcitriol per nephro  PVD Home medications include plavix and ASA. Will hold off until cardiology evaluates. -restart plavix and asa if no procedure needed  DM2 He doesn't take any medications for diabetes at home. BG elevated into 250-300s. He will likely need increasing doses once he is not  NPO. -changed SSI from sensitive to moderate -levemir 5 units daily -cbg monitoring  Best Practice (right click and "Reselect all SmartList Selections" daily)   Diet/type: NPO DVT prophylaxis: LMWH GI prophylaxis: N/A Lines: Central line and Dialysis Catheter Foley:  N/A Code Status:  full code Last date of multidisciplinary goals of care discussion [8/8]   Katianne Barre M. Viren Lebeau, D.O.  Internal Medicine Resident, PGY-2 [facility]  Pager: [phone] 6:17 AM, 11/16/2021

## 2021-11-16 NOTE — Progress Notes (Signed)
Jon Anderson Progress Note  Subjective: extubated, is awake and alert, no c/o's.   Vitals:   11/16/21 0630 11/16/21 0645 11/16/21 0700 11/16/21 0715  BP:      Pulse: 68 69 69   Resp: '18 18 18   '$ Temp:    98.2 F (36.8 C)  TempSrc:    Oral  SpO2: 100% 100% 100%   Weight:      Height:        Exam: Gen alert, no distress, extubated No jvd or bruits Chest clear bilat to bases RRR no MRG Abd soft ntnd no mass or ascites +bs Ext no LE edema Neuro is alert, Ox 3 , nf    LUA AVG+bruit    Home meds include - aspirin, atorvastatin, calcium acetate 2 ac, clopidogrel, gabapentin, midodrine 10 bid, renavite, pantoprazole, prns/ vits/ supps    CXR 8/8 - post arrest, mild basilar atx unchanged, no edema or vasc congestion     OP HD: MWF [facility]  4h  450/ 1.5  78.5kg  2/2 bath P2  LUA AVG Hep 5000 - last HD 8/7, post wt was 78.5kg - last Hb 13.8 on 8/2, last pth 377 - calcitriol 0.75 ug tiw po     Assessment/ Plan: SP PEA arrest - occurred during a fistulogram being done here in IR, 3 min to ROSC approximately. Pt alert, no signs of ABI.  Hypotension / shock - post arrest, remains on levo gtt, also continued on home midodrine 10 bid.  Per CCM.  SP fistulogram - AVG is patent w/ good bruit. Use for HD today. Procedure was done to investigate slow flows on HD.  ESRD - on HD MWF. Had not missed any HD. HD today.  Volume - euvolemic on exam, CXR w/o edema. At dry wt.  Hypokalemia - got runs of IV KCl, K+ better at 5.0 today.  Hypomagnesemia - got 2 gm IV mag sulfate, Mg 2.3 improved DM2 - on SSI Anemia esrd - Hb > 11, no esa needs MBD ckd - CCa in range, will cont vdra w/ HD. Phos is low, hold phoslo for now.        Jon Anderson 11/16/2021, 9:31 AM   Recent Labs  Lab 11/15/21 1314 11/15/21 1315 11/15/21 1451 11/16/21 0303  HGB 12.4*  --  16.3 14.6  ALBUMIN 2.3*  --   --   --   CALCIUM 6.7*  --   --  9.4  PHOS 3.6 2.7  --  2.0*  CREATININE 7.88*  --   --  10.52*   K 2.8*  --  3.4* 5.0   No results for input(s): "IRON", "TIBC", "FERRITIN" in the last 168 hours. Inpatient medications:  calcitRIOL  0.75 mcg Oral Q M,W,F   Chlorhexidine Gluconate Cloth  6 each Topical Q0600   docusate  100 mg Per Tube BID   heparin  5,000 Units Subcutaneous Q8H   insulin aspart  0-15 Units Subcutaneous Q4H   insulin detemir  5 Units Subcutaneous Daily   midodrine  10 mg Oral BID WC   mouth rinse  15 mL Mouth Rinse Q2H   pantoprazole sodium  40 mg Per Tube Daily   polyethylene glycol  17 g Per Tube Daily    sodium chloride 10 mL/hr at 11/16/21 0800   amiodarone     ampicillin-sulbactam (UNASYN) IV     norepinephrine (LEVOPHED) Adult infusion 10 mcg/min (11/16/21 0800)   sodium chloride, docusate sodium, iohexol, ipratropium-albuterol, mouth rinse, polyethylene glycol

## 2021-11-16 NOTE — Progress Notes (Signed)
  71 year old diabetic, ESRD on dialysis, status post brief PEA arrest during IR procedure of revising fistula. ROSC within 3 minutes. Given amiodarone postarrest for brief runs of NSVT.   Critically ill, intubated, on vent, sedated with propofol, on low-dose Levophed.  On exam -awake, follows commands, nonfocal, cool extremities, right foot amputation, clear breath sounds bilateral, S1-S2 regular, no S3, no rub , no JVD or edema.  Labs show mild hyponatremia, hyperglycemia, potassium 5.0, leukocytosis, lactate 5.9.  Chest x-ray dependently reviewed shows cardiomegaly with bilateral basilar atelectasis.  CT abdomen/pelvis did not show any pathology to explain abdominal pain and etiology for his.  Impression/plan Cardiac arrest -unclear cause of PEA, EKG does not show any acute changes, poor R wave progression, check troponin for completion. Check lactate to rule out bowel ischemia Will involve cardiology given known LAD lesion on previous cath  Acute respiratory failure -tolerating spontaneous breathing trial, he was extubated to nasal cannula.  Shock -normal LVEF on echo, low-dose levo. Chronic hypotension on home midodrine No evidence of sepsis  ESRD on HD -dialysis per renal  My independent critical care time was 40 minutes  Celita Aron V. Elsworth Soho MD

## 2021-11-16 NOTE — Progress Notes (Signed)
Pt in bed alert oriented x4 no c/os no distress noted stable for HD LUA AVG +/+. UFG 0

## 2021-11-16 NOTE — Progress Notes (Signed)
PT Cancellation Note  Patient Details Name: Jon Anderson MRN: [id number] DOB: March 06, 1951   Cancelled Treatment:    Reason Eval/Treat Not Completed: Patient at procedure or test/unavailable.  Pt in HD on arrival.  Will see as able 8/10. 11/16/2021  Ginger Carne., PT Acute Rehabilitation Services [phone]  (pager) [phone]  (office)   Tessie Fass Tanya Crothers 11/16/2021, 5:01 PM

## 2021-11-16 NOTE — Progress Notes (Signed)
Pharmacy Antibiotic Note  Jon Anderson is a 71 y.o. male admitted on 11/15/2021 with pneumonia.  Pharmacy has been consulted for Unasyn dosing. iHD patient  Plan: Start Unasyn 1.5 gms IV q12hr Monitor dialysis schedule and C&S  Height: '5\' 9"'$  (175.3 cm) Weight: 78 kg (172 lb) IBW/kg (Calculated) : 70.7  Temp (24hrs), Avg:97.8 F (36.6 C), Min:96.2 F (35.7 C), Max:98.3 F (36.8 C)  Recent Labs  Lab 11/15/21 1308 11/15/21 1314 11/15/21 1315 11/15/21 1648 11/16/21 0303 11/16/21 0820  WBC  --  9.0  --   --  26.1*  --   CREATININE 8.00* 7.88*  --   --  10.52*  --   LATICACIDVEN  --   --  4.0* 5.9*  --  1.7    Estimated Creatinine Clearance: 6.4 mL/min (A) (by C-G formula based on SCr of 10.52 mg/dL (H)).    Allergies  Allergen Reactions   Contrast Media [Iodinated Contrast Media] Anaphylaxis    Throat swelling, hives, SOB   Kiwi Extract Itching, Swelling and Other (See Comments)    Lips and face swell- breathing not affected   Flexeril [Cyclobenzaprine]     Hands become flimsy, can not hold things   Tape Other (See Comments)    "Plastic" tape causes blisters!!    Antimicrobials this admission: Unasyn 8/9 >>   Thank you for allowing pharmacy to be a part of this patient's care.  Alanda Slim, PharmD, [facility] Clinical Pharmacist Please see AMION for all Pharmacists' Contact Phone Numbers 11/16/2021, 9:29 AM

## 2021-11-16 NOTE — Consult Note (Addendum)
Cardiology Consultation:   Patient ID: Jon Anderson MRN: [id number]; DOB: 05-Jul-1950  Admit date: 11/15/2021 Date of Consult: 11/16/2021  PCP:  Juanito Doom, MD   [facility] Providers Cardiologist:  [name], MD   {   Patient Profile:   Jon Anderson is a 71 y.o. male with a hx of ESRD on HD, CAD s/p single vessel CABG (LIMA to LAD via off-pump 01/2017), carotid stenosis,  HTN, HLD, PAD s/p peripheral interventions and right TMA , OSA, type 2 DM, who is being seen 11/16/2021 for the evaluation of cardiac arrest at the request of Dr Elsworth Soho.  History of Present Illness:   Per chart review, Jon Anderson with above PMH has been having dialysis access issues and had recent slow flow from the left arm fistula. He presented to [facility] 11/15/21 for an elective fistulogram and possible intervention by intervention radiology. Post-procedure he woke up with abdominal pain and subsequently suffered PEA arrest. He had ROSC after 1 round of CPR, he went back into PEA arrest, after 1 round of CPR he re-gained ROSC. He did receive Amiodarone for NSVT post code. He is requiring norepinephrine gtt for BP support and intubated post code. He is requiring sedation with propofol. He had no compliant of chest pain before or after the fistulogram.   CT AP 11/15/21 showed no acute findings in abdomen and pelvis. Collapse/consolidation in the right lower lobe difficult to rule out pneumonia, small left pleural effusion with left lower lobe subsegmental Atelectasis, bilateral adrenal adenomas, trace ascites. Prior to CTAP, CXR showed new ET tube tip appears to terminate overlying the right proximal mainstem bronchus 20m distal to carina, retraction recommended.   Noted significant hypokalemia 2.7/2.8 and hypomagnesemia 1.3 on 11/15/21. Labs so far otherwise showed ESRD, lactic acid 4>5.9>1.7, Hgb 12-16. Glucose 60-300s. Hs trop 217. EKG showed sinus tachycardia 138 bpm, poor R wave progression, non specific  IVCD, low voltage QRS, old anteroseptal Q wave. Echo from 11/15/21 showed LVEF >75%, no RWMA, moderate concentric LVH, grade I DD, normal RV, mild LAE, mild MS mean mitral valve gradient is 7.0 mmHg, aortic sclerosis. Cardiology is consulted today for evaluation of PEA arrest.   During encounter, patient states the last thing he recalls is that he felt the contrast being injected in his fistula during the procedure and he developed immediate abdominal pain. He has been in his normal state of health prior to yesterday. He is able to perform daily ADLs. He has no chest pain or SOB with exertional activities, although has limited mobility walking up stairs or long blocks. He has mild chest wall pain now after chest compression and his ET tube has been removed. He is asking could the cardiac arrest happen again. He is tolerating dialysis well, never missed any session, and states his BP is low requiring midodrine.   In brief, patient follows both Dr BGwenlyn Foundand [facility] cardiology outpatient for CAD and PAD. His workup for renal transplant revealed severe 75% stenosis of the LAD and [facility]. Subsequently he had recurrent chest pain symptoms and underwent a cardiac catheterization in 04/2017 by Dr. HEllyn Hackwhich demonstrated a patent LIMA to LAD and otherwise noncritical coronary disease with normal LV function.   He was last seen by Dr BGwenlyn Found4/26/22 and had no chest pain or SOB, remained stable with CAD. He had claudication with PAD status post left SFA intervention in the past, suffered critical limb ischemia  status post right transmetatarsal amputation, and then underwent stenting of his right popliteal artery and angioplasty of his posterior tibial artery. He has carotid artery disease with Dopplers performed 06/04/2020 revealing moderate left ICA stenosis     Past Medical History:  Diagnosis Date   Anemia of chronic disease    Arthritis    CAD (coronary  artery disease)    a.  Myoview 4/11: EF 53%, no scar or ischemia   c. MV 2012 Nl perfusion, apical thinning.  No ischemia or scar.  EF 49%, appears greater by visual estimate.;  d.  Dob stress echo 12/13:  Negative Dob stress echo. There is no evidence of ischemia.  The LVF is normal. b. Normal cors 2016.   Carotid stenosis    a. <16% RICA, >10% LICA by duplex 12/6043   Chronic chest pain    occ   Constipation    chronic   Dyspnea    occasional with extertion   ESRD (end stage renal disease) on dialysis ([place])    M-W-F- [name]   GERD (gastroesophageal reflux disease)    History of hiatal hernia    History of kidney stones    Passed   HNP (herniated nucleus pulposus), lumbar    HTN (hypertension)    echo 3/10: EF 60%, LAE   Hyperlipidemia    Nephrolithiasis    "passed them all"   Peripheral arterial disease ([place])    a. s/p PTCA  right    Pneumonia yrs ago   Restless legs    Sciatic leg pain    Sleep apnea    does not use cpap   Snores    a. presumed OSA, pt has refused sleep eval in past.   Type II diabetes mellitus ([place])    no longer on medications, checks blood glucose at home   Urinary frequency    Uses wheelchair    Walker as ambulation aid     Past Surgical History:  Procedure Laterality Date   A/V FISTULAGRAM Left 01/10/2021   Procedure: A/V FISTULAGRAM;  Surgeon: Waynetta Sandy, MD;  Location: [place] [facility];  Service: Cardiovascular;  Laterality: Left;   A/V FISTULAGRAM Left 05/19/2021   Procedure: A/V Fistulagram;  Surgeon: Marty Heck, MD;  Location: [place] [facility];  Service: Cardiovascular;  Laterality: Left;   ABDOMINAL AORTOGRAM W/LOWER EXTREMITY Bilateral 08/21/2019   Procedure: ABDOMINAL AORTOGRAM W/LOWER EXTREMITY;  Surgeon: Waynetta Sandy, MD;  Location: [place] [facility];  Service: Cardiovascular;  Laterality: Bilateral;   AMPUTATION Right 07/25/2019   Procedure: RIGHT 5th RAY AMPUTATION;  Surgeon: Newt Minion, MD;   Location: [place];  Service: Orthopedics;  Laterality: Right;   AMPUTATION Right 11/19/2019   Procedure: RIGHT TRANSMETATARSAL AMPUTATION;  Surgeon: Newt Minion, MD;  Location: [place];  Service: Orthopedics;  Laterality: Right;   ANGIOPLASTY / STENTING FEMORAL Left 12/11/2013   dr Oaklynn Stierwalt   AV FISTULA PLACEMENT Left 03/19/2014   Procedure: CREATION OF ARTERIOVENOUS (AV) FISTULA  LEFT UPPER ARM;  Surgeon: Mal Misty, MD;  Location: [place];  Service: Vascular;  Laterality: Left;   AV FISTULA PLACEMENT Left 11/09/2020   Procedure: LEFT ARM ARTERIOVENOUS (AV) FISTULA CREATION;  Surgeon: Waynetta Sandy, MD;  Location: [place];  Service: Vascular;  Laterality: Left;   AV FISTULA PLACEMENT Left 02/15/2021   Procedure: INSERTION OF LEFT ARM ARTERIOVENOUS GORE-TEX GRAFT;  Surgeon: Waynetta Sandy, MD;  Location: [place];  Service: Vascular;  Laterality:  Left;   BACK SURGERY  01/2018   screws placed    [place] Left 12/14/2020   Procedure: REVISION OF LEFT ARM SECOND STAGE BASILIC VEIN TRANSPOSITION;  Surgeon: Waynetta Sandy, MD;  Location: [place];  Service: Vascular;  Laterality: Left;   CARDIAC CATHETERIZATION  2001 and 2010    COLONOSCOPY W/ BIOPSIES AND POLYPECTOMY     COLONOSCOPY WITH PROPOFOL N/A 08/01/2016   Procedure: COLONOSCOPY WITH PROPOFOL;  Surgeon: Carol Ada, MD;  Location: [facility];  Service: Endoscopy;  Laterality: N/A;   CORONARY ARTERY BYPASS GRAFT  2019   [facility] x [facility] bypass   ESOPHAGOGASTRODUODENOSCOPY (EGD) WITH PROPOFOL N/A 08/01/2016   Procedure: ESOPHAGOGASTRODUODENOSCOPY (EGD) WITH PROPOFOL;  Surgeon: Carol Ada, MD;  Location: [facility];  Service: Endoscopy;  Laterality: N/A;   FISTULA SUPERFICIALIZATION Left 02/10/2020   Procedure: 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;  Surgeon: Rosetta Posner, MD;  Location: [place];  Service: Vascular;  Laterality: Left;   FISTULA SUPERFICIALIZATION Left 03/23/2020    Procedure: LEFT UPPER EXTREMITY ARTERIOVENOUS FISTULA PLICATION;  Surgeon: Rosetta Posner, MD;  Location: [place];  Service: Vascular;  Laterality: Left;   FOOT FRACTURE SURGERY Right    ligament repair   FRACTURE SURGERY     left forearm   GRAFT APPLICATION Right 04/11/8784   Procedure: FAT GRAFT APPLICATION;  Surgeon: Evelina Bucy, DPM;  Location: [place];  Service: Podiatry;  Laterality: Right;   HEMATOMA EVACUATION Left 11/09/2020   Procedure: EVACUATION HEMATOMA LEFT ARM;  Surgeon: Angelia Mould, MD;  Location: [facility];  Service: Vascular;  Laterality: Left;   INGUINAL HERNIA REPAIR Left    IR AV DIALY SHUNT INTRO NEEDLE/INTRACATH INITIAL W/PTA/IMG LEFT  11/15/2021   IR REMOVAL TUN CV CATH W/O FL  06/16/2021   IR US GUIDE VASC ACCESS LEFT  11/15/2021   IR US GUIDE VASC ACCESS LEFT  11/15/2021   IR US GUIDE VASC ACCESS RIGHT  11/15/2021   LEFT HEART CATH AND CORS/GRAFTS ANGIOGRAPHY N/A 04/27/2017   Procedure: LEFT HEART CATH AND CORS/GRAFTS ANGIOGRAPHY;  Surgeon: Leonie Man, MD;  Location: [place] [facility];  Service: Cardiovascular;  Laterality: N/A;   LEFT HEART CATHETERIZATION WITH CORONARY ANGIOGRAM N/A 06/22/2014   Procedure: LEFT HEART CATHETERIZATION WITH CORONARY ANGIOGRAM;  Surgeon: Troy Sine, MD;  Location: [facility];  Service: Cardiovascular;  Laterality: N/A;   LOWER EXTREMITY ANGIOGRAM Left 12/11/2013   Procedure: LOWER EXTREMITY ANGIOGRAM;  Surgeon: Lorretta Harp, MD;  Location: [facility] C[facility] LAB;  Service: Cardiovascular;  Laterality: Left;   LUMBAR LAMINECTOMY/DECOMPRESSION MICRODISCECTOMY Right 07/03/2017   Procedure: MICRODISCECTOMY LUMBAR FIVE - SACRAL ONE RIGHT;  Surgeon: Consuella Lose, MD;  Location: [place];  Service: Neurosurgery;  Laterality: Right;   LUMBAR LAMINECTOMY/DECOMPRESSION MICRODISCECTOMY Right 10/19/2017   Procedure: MICRODISCECTOMY LUMBAR FIVE- SACRAL 1 ONE ;  Surgeon: Consuella Lose, MD;  Location: [place];  Service:  Neurosurgery;  Laterality: Right;   PERIPHERAL VASCULAR BALLOON ANGIOPLASTY Left 05/19/2021   Procedure: PERIPHERAL VASCULAR BALLOON ANGIOPLASTY;  Surgeon: Marty Heck, MD;  Location: [place] [facility];  Service: Cardiovascular;  Laterality: Left;   PERIPHERAL VASCULAR INTERVENTION Left 01/10/2021   Procedure: PERIPHERAL VASCULAR INTERVENTION;  Surgeon: Waynetta Sandy, MD;  Location: [place] [facility];  Service: Cardiovascular;  Laterality: Left;   THROMBECTOMY W/ EMBOLECTOMY Left 11/09/2020   Procedure: THROMBECTOMY OF LEFT ARTERIOVENOUS FISTULA;  Surgeon: Angelia Mould, MD;  Location: [place];  Service: Vascular;  Laterality: Left;   TONSILLECTOMY AND ADENOIDECTOMY     WISDOM TOOTH EXTRACTION     WOUND DEBRIDEMENT Right 05/13/2019   Procedure: DEBRIDEMENT WOUND;  Surgeon: Evelina Bucy, DPM;  Location: [place];  Service: Podiatry;  Laterality: Right;     Home Medications:  Prior to Admission medications   Medication Sig Start Date End Date Taking? Authorizing Provider  acetaminophen (TYLENOL) 500 MG tablet Take 1,000 mg by mouth every 6 (six) hours as needed for moderate pain.   Yes [provider]  aspirin EC 81 MG tablet Take 1 tablet (81 mg total) by mouth daily. 05/28/12  Yes Richardson Dopp T, PA-C  atorvastatin (LIPITOR) 40 MG tablet Take 1 tablet (40 mg total) by mouth daily at 6 PM. 12/01/19  Yes Angiulli, Lavon Paganini, PA-C  CALCITRIOL PO Take 1 tablet by mouth every Monday, Wednesday, and Friday.   Yes [provider]  calcium acetate (PHOSLO) 667 MG capsule Take 2 capsules (1,334 mg total) by mouth 2 (two) times daily with a meal. 12/01/19  Yes Angiulli, Lavon Paganini, PA-C  clopidogrel (PLAVIX) 75 MG tablet Take 75 mg by mouth daily. 12/01/19  Yes [provider]  Etelcalcetide HCl (PARSABIV IV) Etelcalcetide Hermina Staggers) 08/03/21 08/02/22 Yes [provider]  gabapentin (NEURONTIN) 100 MG capsule Take 100-200 mg by  mouth See admin instructions. 100 mg in the morning, 200 mg in the evening 11/28/19  Yes [provider]  midodrine (PROAMATINE) 10 MG tablet Take 10 mg by mouth in the morning and at bedtime. 12/01/19  Yes [provider]  multivitamin (RENA-VIT) TABS tablet Take 1 tablet by mouth at bedtime. 12/01/19  Yes Angiulli, Lavon Paganini, PA-C  pantoprazole (PROTONIX) 40 MG tablet Take 40 mg by mouth daily.   Yes [provider]  predniSONE (DELTASONE) 50 MG tablet Take one tablet at 13 hours, 7 hours and 1 hour prior to your procedure. 11/10/21  Yes Covington, Roselyn Reef R, NP  loperamide (IMODIUM) 2 MG capsule Take 2 mg by mouth as needed for diarrhea or loose stools.    [provider]  [facility] ULTRA test strip See admin instructions. 01/14/21   [provider]  senna-docusate (SENOKOT-S) 8.6-50 MG tablet Take 2 tablets by mouth 2 (two) times daily as needed for mild constipation.    [provider]    Inpatient Medications: Scheduled Meds:  calcitRIOL  0.75 mcg Oral Q M,W,F   Chlorhexidine Gluconate Cloth  6 each Topical Q0600   docusate sodium  100 mg Oral BID   heparin  5,000 Units Subcutaneous Q8H   insulin aspart  0-15 Units Subcutaneous Q4H   insulin detemir  5 Units Subcutaneous Daily   midodrine  10 mg Oral BID WC   mouth rinse  15 mL Mouth Rinse Q2H   pantoprazole  40 mg Oral QHS   polyethylene glycol  17 g Per Tube Daily   Continuous Infusions:  sodium chloride 10 mL/hr at 11/16/21 0800   amiodarone     ampicillin-sulbactam (UNASYN) IV     norepinephrine (LEVOPHED) Adult infusion 10 mcg/min (11/16/21 0800)   PRN Meds: sodium chloride, docusate sodium, iohexol, ipratropium-albuterol, mouth rinse, polyethylene glycol  Allergies:    Allergies  Allergen Reactions   Contrast Media [Iodinated Contrast Media] Anaphylaxis    Throat swelling, hives, SOB   Kiwi Extract Itching, Swelling and Other (See Comments)    Lips and face swell- breathing  not affected   Flexeril [Cyclobenzaprine]  Hands become flimsy, can not hold things   Tape Other (See Comments)    "Plastic" tape causes blisters!!    Social History:   Social History   Socioeconomic History   Marital status: Married    Spouse name: Not on file   Number of children: Not on file   Years of education: Not on file   Highest education level: Not on file  Occupational History   Occupation: retired  Tobacco Use   Smoking status: Former    Packs/day: 1.00    Years: 2.00    Total pack years: 2.00    Types: Cigarettes    Quit date: 1978    Years since quitting: 45.6   Smokeless tobacco: Never  Vaping Use   Vaping Use: Never used  Substance and Sexual Activity   Alcohol use: Not Currently    Alcohol/week: 0.0 standard drinks of alcohol    Comment: h/o social drinking   Drug use: Not Currently    Types: Marijuana    Comment: quit 1986   Sexual activity: Yes    Birth control/protection: None  Other Topics Concern   Not on file  Social History Narrative   The patient is a Retail buyer.  He is married and has 2 grown children.  Lives in [place] with his wife.  Denies tobacco, alcohol or IV drug abuse or marijuana or cocaine intake.    Social Determinants of Health   Financial Resource Strain: Not on file  Food Insecurity: Not on file  Transportation Needs: Not on file  Physical Activity: Not on file  Stress: Not on file  Social Connections: Not on file  Intimate Partner Violence: Not on file    Family History:    Family History  Problem Relation Age of Onset   Heart attack Sister        died @ 57   Cancer Mother        died @ 20; unknown type   Diabetes Brother        deceased   Cirrhosis Father        alcohol related   Diabetes Father    Esophageal cancer Neg Hx    Colon cancer Neg Hx    Pancreatic cancer Neg Hx    Stomach cancer Neg Hx      ROS:  Constitutional: Denied fever, chills, malaise, night sweats Eyes: Denied vision change or  loss Ears/Nose/Mouth/Throat: Denied ear ache, sore throat, coughing, sinus pain Cardiovascular: see HPI  Respiratory: see HPI  Gastrointestinal: Denied nausea, vomiting, abdominal pain, diarrhea Genital/Urinary: Denied dysuria, hematuria, urinary frequency/urgency Musculoskeletal: Denied muscle ache, joint pain, weakness Skin: Denied rash, wound Neuro: see HPI  Psych: Denied history of depression/anxiety  Endocrine: history of diabetes   Physical Exam/Data:   Vitals:   11/16/21 0630 11/16/21 0645 11/16/21 0700 11/16/21 0715  BP:      Pulse: 68 69 69   Resp: '18 18 18   '$ Temp:    98.2 F (36.8 C)  TempSrc:    Oral  SpO2: 100% 100% 100%   Weight:      Height:        Intake/Output Summary (Last 24 hours) at 11/16/2021 0953 Last data filed at 11/16/2021 0800 Gross per 24 hour  Intake 1104.13 ml  Output 0 ml  Net 1104.13 ml      11/15/2021    8:04 AM 11/10/2021    9:05 AM 05/19/2021    9:05 AM  Last 3 Weights  Weight (lbs)  172 lb 172 lb 170 lb  Weight (kg) 78.019 kg 78.019 kg 77.111 kg     Body mass index is 25.4 kg/m.   Vitals:  Vitals:   11/16/21 0700 11/16/21 0715  BP:    Pulse: 69   Resp: 18   Temp:  98.2 F (36.8 C)  SpO2: 100%    General Appearance: In no apparent distress, laying in bed HEENT: Normocephalic, atraumatic.  Neck: Supple, trachea midline, no JVDs Cardiovascular: Regular rate and rhythm, normal S1-S2,  no murmur Respiratory: Resting breathing unlabored, lungs sounds clear to auscultation bilaterally, no use of accessory muscles. On 2LNC. Gastrointestinal: Bowel sounds positive, abdomen soft, non-tender Extremities: Able to move all extremities in bed without difficulty, no edema Musculoskeletal: Normal muscle bulk and tone Skin: Intact, warm, dry. No rashes or petechiae noted in exposed areas.  Neurologic: Alert, oriented to person, place and time. Fluent speech, no facial droop, no cognitive deficit Psychiatric: Normal affect. Mood is appropriate.      EKG:  The EKG was personally reviewed and demonstrates:    EKG from 11/15/21 with sinus tachycardia 138 bpm, poor R wave progression, non specific IVCD, low voltage QRS, old anteroseptal Q wave.  Telemetry:  Telemetry was personally reviewed and demonstrates:   Sinus rhythm, occasional PAC PVCs, hx of sinus tachycardia noted    Relevant CV Studies:  Echo from 11/15/21:  1. Left ventricular ejection fraction, by estimation, is >75%. The left  ventricle has hyperdynamic function. The left ventricle has no regional  wall motion abnormalities. There is moderate concentric left ventricular  hypertrophy. Left ventricular  diastolic parameters are consistent with Grade I diastolic dysfunction  (impaired relaxation). Elevated left atrial pressure.   2. Right ventricular systolic function is normal. The right ventricular  size is normal. Tricuspid regurgitation signal is inadequate for assessing  PA pressure.   3. Left atrial size was mildly dilated.   4. The mitral valve is degenerative. No evidence of mitral valve  regurgitation. Mild mitral stenosis. The mean mitral valve gradient is 7.0  mmHg with average heart rate of 118 bpm. Severe mitral annular  calcification.   5. The aortic valve is tricuspid. There is mild calcification of the  aortic valve. There is mild thickening of the aortic valve. Aortic valve  regurgitation is not visualized. Aortic valve sclerosis is present, with  no evidence of aortic valve stenosis.   Comparison(s): No significant change from prior study. Prior images  reviewed side by side.   Laboratory Data:  High Sensitivity Troponin:   Recent Labs  Lab 11/16/21 0820  TROPONINIHS 217*     Chemistry Recent Labs  Lab 11/15/21 1308 11/15/21 1314 11/15/21 1451 11/16/21 0303  NA 140 138 132* 128*  K 2.7* 2.8* 3.4* 5.0  CL 103 106  --  93*  CO2  --  18*  --  19*  GLUCOSE 172* 179*  --  246*  BUN 31* 35*  --  50*  CREATININE 8.00* 7.88*  --  10.52*   CALCIUM  --  6.7*  --  9.4  MG  --  1.3*  --  2.3  GFRNONAA  --  7*  --  5*  ANIONGAP  --  14  --  16*    Recent Labs  Lab 11/15/21 1314  PROT 4.4*  ALBUMIN 2.3*  AST 13*  ALT 9  ALKPHOS 64  BILITOT 0.5   Lipids  Recent Labs  Lab 11/16/21 0303  TRIG 87  Hematology Recent Labs  Lab 11/15/21 1314 11/15/21 1451 11/16/21 0303  WBC 9.0  --  26.1*  RBC 3.95*  --  4.74  HGB 12.4* 16.3 14.6  HCT 36.7* 48.0 42.4  MCV 92.9  --  89.5  MCH 31.4  --  30.8  MCHC 33.8  --  34.4  RDW 14.1  --  14.3  PLT 185  --  254   Thyroid No results for input(s): "TSH", "FREET4" in the last 168 hours.  BNPNo results for input(s): "BNP", "PROBNP" in the last 168 hours.  DDimer No results for input(s): "DDIMER" in the last 168 hours.   Radiology/Studies:  DG Chest Port 1 View  Result Date: 11/16/2021 CLINICAL DATA:  Intubated. EXAM: PORTABLE CHEST 1 VIEW COMPARISON:  11/15/2021 FINDINGS: Normal sized heart. Endotracheal tube in satisfactory position. Nasogastric tube tip and side hole in the proximal stomach. Poor inspiration with increased bibasilar atelectasis. Tortuous and calcified thoracic aorta. Lower thoracic spine degenerative changes. IMPRESSION: Poor inspiration with increased bibasilar atelectasis. Electronically Signed   By: Claudie Revering M.D.   On: 11/16/2021 08:17   IR AV DIALY SHUNT INTRO NEEDLE/INTRACATH INITIAL W/PTA/IMG LEFT  Result Date: 11/15/2021 INDICATION: 71 year old gentleman with revised left upper extremity dialysis graft presents to IR for fistulogram and possible intervention given suboptimal dialysis. EXAM: 1. Ultrasound-guided antegrade and retrograde access of left upper extremity AV graft 2. Left upper extremity fistulagram 3. Angioplasty of arterial and venous anastomotic stenoses 4. Left common femoral vein non tunneled central line placement with ultrasound guidance 5. Right radial arterial line placement with ultrasound guidance MEDICATIONS: None.  ANESTHESIA/SEDATION: Moderate (conscious) sedation was employed during this procedure. A total of Versed 1 mg and Fentanyl 25 mcg was administered intravenously by the radiology nurse. Total intra-service moderate Sedation Time: 40 minutes. The patient's level of consciousness and vital signs were monitored continuously by radiology nursing throughout the procedure under my direct supervision. FLUOROSCOPY: Radiation Exposure Index (as provided by the fluoroscopic device): 26 mGy Kerma COMPLICATIONS: SIR LEVEL D - Requires major therapy, prolonged hospitalization (>48 hours). PROCEDURE: Informed written consent was obtained from the patient after a thorough discussion of the procedural risks, benefits and alternatives. All questions were addressed. Maximal Sterile Barrier Technique was utilized including caps, mask, sterile gowns, sterile gloves, sterile drape, hand hygiene and skin antiseptic. A timeout was performed prior to the initiation of the procedure. Ultrasound image documenting patency of the graft was obtained and placed in permanent medical record. Sterile ultrasound probe cover and gel utilized throughout the procedure. Utilizing continuous ultrasound guidance, the left upper extremity AV graft was accessed in a retrograde direction with a 21 gauge needle. 21 gauge needle exchanged for a transitional dilator set over 0.018 inch guidewire. Transitional dilator set exchanged for 6 French sheath over 0.035 inch guidewire. Fistulagram demonstrated moderate long segment stenosis of the proximal venous outflow and anastomosis. Moderate hemodynamically significant focal stenosis also noted at the venous anastomosis. No significant central stenosis was identified. The proximal venous outflow stenosis was crossed with 0.035 inch glidewire. The stenosis was angioplastied with 6 mm balloon resulting in excellent luminal gain and improved flow. The retrograde access was removed and hemostasis achieved with manual  compression. Ultrasound image documenting patency of the graft was again obtained and placed in permanent medical record. Sterile ultrasound probe cover and gel utilized throughout the procedure. Utilizing continuous ultrasound guidance, the left upper extremity AV graft was accessed in a antegrade direction with a 21 gauge needle. 21 gauge needle  exchanged for a transitional dilator set over 0.018 inch guidewire. Transitional dilator set exchanged for 6 French sheath over 0.035 inch guidewire. The moderate hemodynamically significant stenosis of the venous anastomosis was plastied and with an 8 mm balloon. Post plasty venogram showed moderate luminal gain. At this point the patient began to complain of severe abdominal pain and nausea and lost consciousness. He became tachycardic and hypotensive, and his pulse was difficult to palpate. The procedure was terminated and code blue was initiated. Code team requested central venous access an arterial line. Decision was made to obtain central venous access at the left groin. Ultrasound image documenting patency of the left common femoral vein was obtained and placed in permanent medical record. Sterile ultrasound probe cover and gel utilized throughout the procedure. Utilizing continuous ultrasound guidance, the left common femoral vein was accessed with a 21 gauge needle. 21 gauge needle exchanged for a transitional dilator set over 0.018 inch guidewire. Transitional dilator set was removed over 0.035 inch guidewire and serial dilation was performed. Triple-lumen central venous catheter was inserted. All lumens aspirated and flushed well. The catheter was secured to skin with suture and covered with bio patch and sterile dressing. Ultrasound image documenting patency of the right radial artery was obtained. Sterile ultrasound probe cover and gel were utilized throughout the procedure. Utilizing continued ultrasound guidance, the right radial artery was accessed at the  level of the distal forearm with 18 gauge Angiocath. Angiocath was secured to skin with tape and connected to arterial line monitor. IMPRESSION: 1. Left upper extremity AV graft evaluation shows stenoses of the arterial and venous anastomoses which were respectively treated with 6 and 8 mm balloons. 2. Procedure complicated by cardiac arrest requiring CPR and intubation. Patient transferred to the ICU for further management. ACCESS: This access remains amenable to future percutaneous interventions as clinically indicated. Electronically Signed   By: Miachel Roux M.D.   On: 11/15/2021 17:37   IR US Guide Vasc Access Left  Result Date: 11/15/2021 INDICATION: 71 year old gentleman with revised left upper extremity dialysis graft presents to IR for fistulogram and possible intervention given suboptimal dialysis. EXAM: 1. Ultrasound-guided antegrade and retrograde access of left upper extremity AV graft 2. Left upper extremity fistulagram 3. Angioplasty of arterial and venous anastomotic stenoses 4. Left common femoral vein non tunneled central line placement with ultrasound guidance 5. Right radial arterial line placement with ultrasound guidance MEDICATIONS: None. ANESTHESIA/SEDATION: Moderate (conscious) sedation was employed during this procedure. A total of Versed 1 mg and Fentanyl 25 mcg was administered intravenously by the radiology nurse. Total intra-service moderate Sedation Time: 40 minutes. The patient's level of consciousness and vital signs were monitored continuously by radiology nursing throughout the procedure under my direct supervision. FLUOROSCOPY: Radiation Exposure Index (as provided by the fluoroscopic device): 26 mGy Kerma COMPLICATIONS: SIR LEVEL D - Requires major therapy, prolonged hospitalization (>48 hours). PROCEDURE: Informed written consent was obtained from the patient after a thorough discussion of the procedural risks, benefits and alternatives. All questions were addressed. Maximal  Sterile Barrier Technique was utilized including caps, mask, sterile gowns, sterile gloves, sterile drape, hand hygiene and skin antiseptic. A timeout was performed prior to the initiation of the procedure. Ultrasound image documenting patency of the graft was obtained and placed in permanent medical record. Sterile ultrasound probe cover and gel utilized throughout the procedure. Utilizing continuous ultrasound guidance, the left upper extremity AV graft was accessed in a retrograde direction with a 21 gauge needle. 21 gauge needle exchanged for  a transitional dilator set over 0.018 inch guidewire. Transitional dilator set exchanged for 6 French sheath over 0.035 inch guidewire. Fistulagram demonstrated moderate long segment stenosis of the proximal venous outflow and anastomosis. Moderate hemodynamically significant focal stenosis also noted at the venous anastomosis. No significant central stenosis was identified. The proximal venous outflow stenosis was crossed with 0.035 inch glidewire. The stenosis was angioplastied with 6 mm balloon resulting in excellent luminal gain and improved flow. The retrograde access was removed and hemostasis achieved with manual compression. Ultrasound image documenting patency of the graft was again obtained and placed in permanent medical record. Sterile ultrasound probe cover and gel utilized throughout the procedure. Utilizing continuous ultrasound guidance, the left upper extremity AV graft was accessed in a antegrade direction with a 21 gauge needle. 21 gauge needle exchanged for a transitional dilator set over 0.018 inch guidewire. Transitional dilator set exchanged for 6 French sheath over 0.035 inch guidewire. The moderate hemodynamically significant stenosis of the venous anastomosis was plastied and with an 8 mm balloon. Post plasty venogram showed moderate luminal gain. At this point the patient began to complain of severe abdominal pain and nausea and lost  consciousness. He became tachycardic and hypotensive, and his pulse was difficult to palpate. The procedure was terminated and code blue was initiated. Code team requested central venous access an arterial line. Decision was made to obtain central venous access at the left groin. Ultrasound image documenting patency of the left common femoral vein was obtained and placed in permanent medical record. Sterile ultrasound probe cover and gel utilized throughout the procedure. Utilizing continuous ultrasound guidance, the left common femoral vein was accessed with a 21 gauge needle. 21 gauge needle exchanged for a transitional dilator set over 0.018 inch guidewire. Transitional dilator set was removed over 0.035 inch guidewire and serial dilation was performed. Triple-lumen central venous catheter was inserted. All lumens aspirated and flushed well. The catheter was secured to skin with suture and covered with bio patch and sterile dressing. Ultrasound image documenting patency of the right radial artery was obtained. Sterile ultrasound probe cover and gel were utilized throughout the procedure. Utilizing continued ultrasound guidance, the right radial artery was accessed at the level of the distal forearm with 18 gauge Angiocath. Angiocath was secured to skin with tape and connected to arterial line monitor. IMPRESSION: 1. Left upper extremity AV graft evaluation shows stenoses of the arterial and venous anastomoses which were respectively treated with 6 and 8 mm balloons. 2. Procedure complicated by cardiac arrest requiring CPR and intubation. Patient transferred to the ICU for further management. ACCESS: This access remains amenable to future percutaneous interventions as clinically indicated. Electronically Signed   By: Miachel Roux M.D.   On: 11/15/2021 17:37   IR US Guide Vasc Access Right  Result Date: 11/15/2021 INDICATION: 71 year old gentleman with revised left upper extremity dialysis graft presents to IR  for fistulogram and possible intervention given suboptimal dialysis. EXAM: 1. Ultrasound-guided antegrade and retrograde access of left upper extremity AV graft 2. Left upper extremity fistulagram 3. Angioplasty of arterial and venous anastomotic stenoses 4. Left common femoral vein non tunneled central line placement with ultrasound guidance 5. Right radial arterial line placement with ultrasound guidance MEDICATIONS: None. ANESTHESIA/SEDATION: Moderate (conscious) sedation was employed during this procedure. A total of Versed 1 mg and Fentanyl 25 mcg was administered intravenously by the radiology nurse. Total intra-service moderate Sedation Time: 40 minutes. The patient's level of consciousness and vital signs were monitored continuously by radiology nursing  throughout the procedure under my direct supervision. FLUOROSCOPY: Radiation Exposure Index (as provided by the fluoroscopic device): 26 mGy Kerma COMPLICATIONS: SIR LEVEL D - Requires major therapy, prolonged hospitalization (>48 hours). PROCEDURE: Informed written consent was obtained from the patient after a thorough discussion of the procedural risks, benefits and alternatives. All questions were addressed. Maximal Sterile Barrier Technique was utilized including caps, mask, sterile gowns, sterile gloves, sterile drape, hand hygiene and skin antiseptic. A timeout was performed prior to the initiation of the procedure. Ultrasound image documenting patency of the graft was obtained and placed in permanent medical record. Sterile ultrasound probe cover and gel utilized throughout the procedure. Utilizing continuous ultrasound guidance, the left upper extremity AV graft was accessed in a retrograde direction with a 21 gauge needle. 21 gauge needle exchanged for a transitional dilator set over 0.018 inch guidewire. Transitional dilator set exchanged for 6 French sheath over 0.035 inch guidewire. Fistulagram demonstrated moderate long segment stenosis of the  proximal venous outflow and anastomosis. Moderate hemodynamically significant focal stenosis also noted at the venous anastomosis. No significant central stenosis was identified. The proximal venous outflow stenosis was crossed with 0.035 inch glidewire. The stenosis was angioplastied with 6 mm balloon resulting in excellent luminal gain and improved flow. The retrograde access was removed and hemostasis achieved with manual compression. Ultrasound image documenting patency of the graft was again obtained and placed in permanent medical record. Sterile ultrasound probe cover and gel utilized throughout the procedure. Utilizing continuous ultrasound guidance, the left upper extremity AV graft was accessed in a antegrade direction with a 21 gauge needle. 21 gauge needle exchanged for a transitional dilator set over 0.018 inch guidewire. Transitional dilator set exchanged for 6 French sheath over 0.035 inch guidewire. The moderate hemodynamically significant stenosis of the venous anastomosis was plastied and with an 8 mm balloon. Post plasty venogram showed moderate luminal gain. At this point the patient began to complain of severe abdominal pain and nausea and lost consciousness. He became tachycardic and hypotensive, and his pulse was difficult to palpate. The procedure was terminated and code blue was initiated. Code team requested central venous access an arterial line. Decision was made to obtain central venous access at the left groin. Ultrasound image documenting patency of the left common femoral vein was obtained and placed in permanent medical record. Sterile ultrasound probe cover and gel utilized throughout the procedure. Utilizing continuous ultrasound guidance, the left common femoral vein was accessed with a 21 gauge needle. 21 gauge needle exchanged for a transitional dilator set over 0.018 inch guidewire. Transitional dilator set was removed over 0.035 inch guidewire and serial dilation was  performed. Triple-lumen central venous catheter was inserted. All lumens aspirated and flushed well. The catheter was secured to skin with suture and covered with bio patch and sterile dressing. Ultrasound image documenting patency of the right radial artery was obtained. Sterile ultrasound probe cover and gel were utilized throughout the procedure. Utilizing continued ultrasound guidance, the right radial artery was accessed at the level of the distal forearm with 18 gauge Angiocath. Angiocath was secured to skin with tape and connected to arterial line monitor. IMPRESSION: 1. Left upper extremity AV graft evaluation shows stenoses of the arterial and venous anastomoses which were respectively treated with 6 and 8 mm balloons. 2. Procedure complicated by cardiac arrest requiring CPR and intubation. Patient transferred to the ICU for further management. ACCESS: This access remains amenable to future percutaneous interventions as clinically indicated. Electronically Signed   By: Sharen Heck  Mir M.D.   On: 11/15/2021 17:37   IR US Guide Vasc Access Left  Result Date: 11/15/2021 INDICATION: 71 year old gentleman with revised left upper extremity dialysis graft presents to IR for fistulogram and possible intervention given suboptimal dialysis. EXAM: 1. Ultrasound-guided antegrade and retrograde access of left upper extremity AV graft 2. Left upper extremity fistulagram 3. Angioplasty of arterial and venous anastomotic stenoses 4. Left common femoral vein non tunneled central line placement with ultrasound guidance 5. Right radial arterial line placement with ultrasound guidance MEDICATIONS: None. ANESTHESIA/SEDATION: Moderate (conscious) sedation was employed during this procedure. A total of Versed 1 mg and Fentanyl 25 mcg was administered intravenously by the radiology nurse. Total intra-service moderate Sedation Time: 40 minutes. The patient's level of consciousness and vital signs were monitored continuously by  radiology nursing throughout the procedure under my direct supervision. FLUOROSCOPY: Radiation Exposure Index (as provided by the fluoroscopic device): 26 mGy Kerma COMPLICATIONS: SIR LEVEL D - Requires major therapy, prolonged hospitalization (>48 hours). PROCEDURE: Informed written consent was obtained from the patient after a thorough discussion of the procedural risks, benefits and alternatives. All questions were addressed. Maximal Sterile Barrier Technique was utilized including caps, mask, sterile gowns, sterile gloves, sterile drape, hand hygiene and skin antiseptic. A timeout was performed prior to the initiation of the procedure. Ultrasound image documenting patency of the graft was obtained and placed in permanent medical record. Sterile ultrasound probe cover and gel utilized throughout the procedure. Utilizing continuous ultrasound guidance, the left upper extremity AV graft was accessed in a retrograde direction with a 21 gauge needle. 21 gauge needle exchanged for a transitional dilator set over 0.018 inch guidewire. Transitional dilator set exchanged for 6 French sheath over 0.035 inch guidewire. Fistulagram demonstrated moderate long segment stenosis of the proximal venous outflow and anastomosis. Moderate hemodynamically significant focal stenosis also noted at the venous anastomosis. No significant central stenosis was identified. The proximal venous outflow stenosis was crossed with 0.035 inch glidewire. The stenosis was angioplastied with 6 mm balloon resulting in excellent luminal gain and improved flow. The retrograde access was removed and hemostasis achieved with manual compression. Ultrasound image documenting patency of the graft was again obtained and placed in permanent medical record. Sterile ultrasound probe cover and gel utilized throughout the procedure. Utilizing continuous ultrasound guidance, the left upper extremity AV graft was accessed in a antegrade direction with a 21 gauge  needle. 21 gauge needle exchanged for a transitional dilator set over 0.018 inch guidewire. Transitional dilator set exchanged for 6 French sheath over 0.035 inch guidewire. The moderate hemodynamically significant stenosis of the venous anastomosis was plastied and with an 8 mm balloon. Post plasty venogram showed moderate luminal gain. At this point the patient began to complain of severe abdominal pain and nausea and lost consciousness. He became tachycardic and hypotensive, and his pulse was difficult to palpate. The procedure was terminated and code blue was initiated. Code team requested central venous access an arterial line. Decision was made to obtain central venous access at the left groin. Ultrasound image documenting patency of the left common femoral vein was obtained and placed in permanent medical record. Sterile ultrasound probe cover and gel utilized throughout the procedure. Utilizing continuous ultrasound guidance, the left common femoral vein was accessed with a 21 gauge needle. 21 gauge needle exchanged for a transitional dilator set over 0.018 inch guidewire. Transitional dilator set was removed over 0.035 inch guidewire and serial dilation was performed. Triple-lumen central venous catheter was inserted. All lumens aspirated  and flushed well. The catheter was secured to skin with suture and covered with bio patch and sterile dressing. Ultrasound image documenting patency of the right radial artery was obtained. Sterile ultrasound probe cover and gel were utilized throughout the procedure. Utilizing continued ultrasound guidance, the right radial artery was accessed at the level of the distal forearm with 18 gauge Angiocath. Angiocath was secured to skin with tape and connected to arterial line monitor. IMPRESSION: 1. Left upper extremity AV graft evaluation shows stenoses of the arterial and venous anastomoses which were respectively treated with 6 and 8 mm balloons. 2. Procedure complicated  by cardiac arrest requiring CPR and intubation. Patient transferred to the ICU for further management. ACCESS: This access remains amenable to future percutaneous interventions as clinically indicated. Electronically Signed   By: Miachel Roux M.D.   On: 11/15/2021 17:37   CT ABDOMEN PELVIS WO CONTRAST  Result Date: 11/15/2021 CLINICAL DATA:  Abdominal pain. EXAM: CT ABDOMEN AND PELVIS WITHOUT CONTRAST TECHNIQUE: Multidetector CT imaging of the abdomen and pelvis was performed following the standard protocol without IV contrast. RADIATION DOSE REDUCTION: This exam was performed according to the departmental dose-optimization program which includes automated exposure control, adjustment of the mA and/or kV according to patient size and/or use of iterative reconstruction technique. COMPARISON:  04/24/2019. FINDINGS: Lower chest: Coronary artery calcification. Heart is at the upper limits of normal in size. No pericardial effusion. Atherosclerotic calcification of the aorta, aortic valve and coronary arteries. Nasogastric tube is seen in the distal esophagus, extending into the stomach. Hepatobiliary: Liver and gallbladder are unremarkable. No biliary ductal dilatation. Pancreas: Negative. Spleen: Negative. Adrenals/Urinary Tract: Low-attenuation thickening of the lateral limb right adrenal gland measures 3 Hounsfield units, unchanged. No follow-up necessary. 1.8 cm nodule in the left adrenal gland measures 15 Hounsfield units, unchanged. No follow-up necessary. Kidneys are atrophic and contain low-attenuation lesions measuring up to 1.4 cm on the right. No specific follow-up necessary. No urinary stones. Ureters are decompressed. Bladder is low in volume. Stomach/Bowel: Nasogastric tube terminates in body of the stomach. Stomach, small bowel, appendix and colon are otherwise unremarkable. Vascular/Lymphatic: Atherosclerotic calcification of the aorta. Left femoral central line terminates in the left common iliac  vein. No pathologically enlarged lymph nodes. Reproductive: Prostate is mildly prominent. Other: Trace ascites. Mesenteries and peritoneum are otherwise unremarkable. Right inguinal hernia repair. Musculoskeletal: Degenerative and postoperative changes in the spine. Bilateral hip osteoarthritis. IMPRESSION: 1. No acute findings in the abdomen or pelvis to explain the patient's pain. 2. Collapse/consolidation in the right lower lobe. Difficult to exclude pneumonia. 3. Small left pleural effusion with left lower lobe subsegmental atelectasis. 4. Bilateral adrenal adenomas. 5. Trace ascites. 6. Prostate is mildly prominent. 7.  Aortic atherosclerosis (ICD10-I70.0). Electronically Signed   By: Lorin Picket M.D.   On: 11/15/2021 16:30   ECHOCARDIOGRAM COMPLETE  Result Date: 11/15/2021    ECHOCARDIOGRAM REPORT   Patient Name:   Jon Anderson Date of Exam: 11/15/2021 Medical Rec #:  [id number]          Height:       69.0 in Accession #:    [id number]         Weight:       172.0 lb Date of Birth:  1950/11/10          BSA:          1.938 m Patient Age:    29 years           BP:  100/60 mmHg Patient Gender: M                  HR:           105 bpm. Exam Location:  Inpatient Procedure: 2D Echo, Color Doppler, Cardiac Doppler and Intracardiac            Opacification Agent Indications:    Cardiac Arrest i46.9  History:        Patient has prior history of Echocardiogram examinations, most                 recent 02/23/2018. CHF, Prior CABG; Risk Factors:Hypertension,                 Diabetes, Dyslipidemia and ESRD.  Sonographer:    Raquel Sarna Senior RDCS Referring Phys: [phone] Silvestre Moment [facility]  Sonographer Comments: Scanned upright on artificial respirator. Parasternals and SSN limited by ET tube. IMPRESSIONS  1. Left ventricular ejection fraction, by estimation, is >75%. The left ventricle has hyperdynamic function. The left ventricle has no regional wall motion abnormalities. There is moderate concentric left  ventricular hypertrophy. Left ventricular diastolic parameters are consistent with Grade I diastolic dysfunction (impaired relaxation). Elevated left atrial pressure.  2. Right ventricular systolic function is normal. The right ventricular size is normal. Tricuspid regurgitation signal is inadequate for assessing PA pressure.  3. Left atrial size was mildly dilated.  4. The mitral valve is degenerative. No evidence of mitral valve regurgitation. Mild mitral stenosis. The mean mitral valve gradient is 7.0 mmHg with average heart rate of 118 bpm. Severe mitral annular calcification.  5. The aortic valve is tricuspid. There is mild calcification of the aortic valve. There is mild thickening of the aortic valve. Aortic valve regurgitation is not visualized. Aortic valve sclerosis is present, with no evidence of aortic valve stenosis. Comparison(s): No significant change from prior study. Prior images reviewed side by side. FINDINGS  Left Ventricle: Left ventricular ejection fraction, by estimation, is >75%. The left ventricle has hyperdynamic function. The left ventricle has no regional wall motion abnormalities. Definity contrast agent was given IV to delineate the left ventricular endocardial borders. The left ventricular internal cavity size was small. There is moderate concentric left ventricular hypertrophy. Left ventricular diastolic parameters are consistent with Grade I diastolic dysfunction (impaired relaxation). Elevated left atrial pressure. Right Ventricle: The right ventricular size is normal. Right vetricular wall thickness was not well visualized. Right ventricular systolic function is normal. Tricuspid regurgitation signal is inadequate for assessing PA pressure. Left Atrium: Left atrial size was mildly dilated. Right Atrium: Right atrial size was normal in size. Pericardium: There is no evidence of pericardial effusion. Mitral Valve: The mitral valve is degenerative in appearance. Severe mitral annular  calcification. No evidence of mitral valve regurgitation. Mild mitral valve stenosis. MV peak gradient, 16.0 mmHg. The mean mitral valve gradient is 7.0 mmHg with average  heart rate of 118 bpm. Tricuspid Valve: The tricuspid valve is normal in structure. Tricuspid valve regurgitation is not demonstrated. Aortic Valve: The aortic valve is tricuspid. There is mild calcification of the aortic valve. There is mild thickening of the aortic valve. Aortic valve regurgitation is not visualized. Aortic valve sclerosis is present, with no evidence of aortic valve stenosis. Pulmonic Valve: The pulmonic valve was not well visualized. Pulmonic valve regurgitation is not visualized. Aorta: The aortic root is normal in size and structure. IAS/Shunts: The interatrial septum was not well visualized.  LEFT VENTRICLE PLAX 2D LVIDd:  2.80 cm   Diastology LVIDs:         1.80 cm   LV e' medial:    6.37 cm/s LV PW:         1.30 cm   LV E/e' medial:  18.1 LV IVS:        1.60 cm   LV e' lateral:   9.32 cm/s LVOT diam:     1.90 cm   LV E/e' lateral: 12.3 LV SV:         56 LV SV Index:   29 LVOT Area:     2.84 cm  RIGHT VENTRICLE RV S prime:     12.60 cm/s TAPSE (M-mode): 1.3 cm LEFT ATRIUM             Index        RIGHT ATRIUM           Index LA diam:        4.00 cm 2.06 cm/m   RA Area:     12.50 cm LA Vol (A2C):   33.8 ml 17.44 ml/m  RA Volume:   26.20 ml  13.52 ml/m LA Vol (A4C):   67.4 ml 34.78 ml/m LA Biplane Vol: 52.4 ml 27.04 ml/m  AORTIC VALVE LVOT Vmax:   120.00 cm/s LVOT Vmean:  80.300 cm/s LVOT VTI:    0.197 m  AORTA Ao Root diam: 3.30 cm MITRAL VALVE MV Area (PHT): 4.08 cm     SHUNTS MV Area VTI:   1.85 cm     Systemic VTI:  0.20 m MV Peak grad:  16.0 mmHg    Systemic Diam: 1.90 cm MV Mean grad:  7.0 mmHg MV Vmax:       2.00 m/s MV Vmean:      126.0 cm/s MV Decel Time: 186 msec MV E velocity: 115.00 cm/s MV A velocity: 184.00 cm/s MV E/A ratio:  0.62 Mihai Croitoru MD Electronically signed by Sanda Klein MD  Signature Date/Time: 11/15/2021/4:11:07 PM    Final    Portable Chest x-ray  Result Date: 11/15/2021 CLINICAL DATA:  Endotracheal tube present. Abdominal pain. Tube placement. EXAM: PORTABLE CHEST 1 VIEW COMPARISON:  Chest two views 10/15/2019 FINDINGS: New endotracheal tube tip appears to terminate within the proximal right mainstem bronchus, approximately 9 mm distal to the carina. Recommend retraction. New enteric tube courses over the esophagus with the side port and tip overlying left upper abdominal quadrant. A temporary pacer overlies the mid heart and upper abdomen. Numerous additional likely EKG leads overlie the thorax. Cardiac silhouette is at the upper limits of normal size. Moderate calcification within the aortic arch. Unchanged mildly decreased lung volumes with mild elevation of the right hemidiaphragm. Mild right-greater-than-left horizontal linear basilar subsegmental atelectasis is similar to prior. Mild dextrocurvature of the midthoracic spine with moderate multilevel degenerative disc and endplate changes. IMPRESSION: 1. New endotracheal tube tip appears to terminate overlying the proximal mainstem bronchus. Consider retraction approximately 2-3 cm. 2. New likely enteric tube appears in appropriate position. Critical Value/emergent results were called by telephone at the time of interpretation on 11/15/2021 at 2:56 pm to provider Georgann Housekeeper, NP, who verbally acknowledged these results. Electronically Signed   By: Yvonne Kendall M.D.   On: 11/15/2021 14:56   DG Abd 1 View  Result Date: 11/15/2021 CLINICAL DATA:  Abdominal pain.  Assess tube placement. EXAM: ABDOMEN - 1 VIEW COMPARISON:  06/10/2015 FINDINGS: Nasogastric tube courses into the region of the stomach and off the film as tip  is not visualized. Nasogastric tube side port is over the region of the stomach in the left upper quadrant. Bowel gas pattern is nonobstructive. Fusion hardware present over the lumbosacral region. There are  degenerative changes of the spine and hips. Surgical clips over the pelvis. IMPRESSION: 1. Nonobstructive bowel gas pattern. 2. Nasogastric tube with side port over the stomach in the left upper quadrant. Electronically Signed   By: Marin Olp M.D.   On: 11/15/2021 14:41     Assessment and Plan:   PEA arrest  - possibly due to hypokalemia 2.7 and hypomagnesemia 1.3 versus anaphylactic reaction to contrast despite pre-med  - EKG with non-specific findings - Hs trop 217, 2nd pending  - Echo showed LVEF >75%, no RWMA, grade I DD, normal RV, mild LAE, mild MS - clinical picture not consistent with acute ACS, will follow 2nd trop, unlikely further cardiac work up  CAD - hx of single vessel CABG (LIMA to LAD via off-pump 01/2017),  - LHC 04/2017 showed patent LIMA to LAD and otherwise noncritical coronary disease with normal LV function - no chest pain or angina symptoms at home  - continue ASA, Plavix, and statin, no BB due to chronic hypotension requiring midodrine   ESRD Acute hypoxic respiratory failure  Hypotension DM - per primary team     Risk Assessment/Risk Scores:     For questions or updates, please contact [facility] Please consult [email or web address] for contact info under    Signed, Margie Billet, NP  11/16/2021 9:53 AM  Agree with note by Margie Billet NP  Patient well-known to me.  I last saw him in the office 4/22.  He has a history of PAD and CAD.  He had off-pump LIMA to the LAD at [facility] 01/27/2017.  Cardiac cath performed by Dr. Ellyn Hack 1/19 showed a patent LIMA to the LAD without other significant CAD.  He also has PAD followed by [facility] in the past.  He has end-stage renal disease on hemodialysis as well as a history of hypertension and hyperlipidemia.  He had an IR procedure done yesterday with contrast.  Apparently has contrast allergy and was premedicated appropriately.  After the contrast was injected he developed abdominal pain and then  lost consciousness.  He had witnessed PEA arrest and had CPR for approximately 5 minutes and then had ROSC.  He was extubated today now on nasal cannula.  His 2D echo was normal with an EF that was hyperdynamic.  Rhythm is sinus.  EKG shows sinus tachycardia with septal Q waves unchanged from prior EKGs.  His troponin is mildly elevated consistent with end-stage renal disease and CPR.  His exam is benign.  He does have chest pain from CPR but has denied chest pain prior to the procedure.  I believe his cardiac arrest was related to contrast allergy despite being premedicated.  No further cardiac workup is necessary at this time.  I will see him back in the office in follow-up.  Lorretta Harp, M.D., [place], [facility], [place] [address] [place], [place]  [address]  [phone] 11/16/2021 12:17 PM

## 2021-11-16 NOTE — Procedures (Signed)
Extubation Procedure Note  Patient Details:   Name: Jon Anderson DOB: 07-10-1950 MRN: [id number]   Airway Documentation:    Vent end date: 11/16/21 Vent end time: 0844   Evaluation  O2 sats: stable throughout Complications: No apparent complications Patient did tolerate procedure well. Bilateral Breath Sounds: Clear, Diminished   Yes Placed on 4L min [facility] Incentive spirometer instructed  Revonda Standard 11/16/2021, 8:44 AM

## 2021-11-16 NOTE — Progress Notes (Signed)
Inpatient Diabetes Program Recommendations  AACE/ADA: New Consensus Statement on Inpatient Glycemic Control (2015)  Target Ranges:  Prepandial:   less than 140 mg/dL      Peak postprandial:   less than 180 mg/dL (1-2 hours)      Critically ill patients:  140 - 180 mg/dL   Lab Results  Component Value Date   GLUCAP 248 (H) 11/16/2021   HGBA1C 7.6 (H) 11/15/2021    Review of Glycemic Control  Latest Reference Range & Units 11/15/21 23:05 11/16/21 03:02 11/16/21 07:13  Glucose-Capillary 70 - 99 mg/dL 241 (H) 256 (H) 248 (H)   Diabetes history: DM 2 Outpatient Diabetes medications: None listed Current orders for Inpatient glycemic control:  Novolog 0-15 units q 4 hours Levemir 5 units daily  Inpatient Diabetes Program Recommendations:    Will follow.  May consider reducing to sensitive q 4 hours.   Thanks,  Adah Perl, RN, BC-ADM Inpatient Diabetes Coordinator Pager [phone]  (8a-5p)

## 2021-11-16 NOTE — Progress Notes (Signed)
[facility] Progress Note Patient Name: Jon Anderson DOB: [birth year] MRN: [id number]   Date of Service  11/16/2021  HPI/Events of Note  Patient extubated and eating a diet - Nursing request to change CBG checks and moderate Novolog SSI to AC/HS.  eICU Interventions  Will change CBG checks and moderate Novolog SSI to AC/HS.      Intervention Category Major Interventions: Hyperglycemia - active titration of insulin therapy  Lysle Dingwall 11/16/2021, 7:57 PM

## 2021-11-17 DIAGNOSIS — N186 End stage renal disease: Secondary | ICD-10-CM | POA: Diagnosis not present

## 2021-11-17 DIAGNOSIS — I469 Cardiac arrest, cause unspecified: Secondary | ICD-10-CM | POA: Diagnosis not present

## 2021-11-17 LAB — CBC
HCT: 35.9 % — ABNORMAL LOW (ref 39.0–52.0)
Hemoglobin: 12.3 g/dL — ABNORMAL LOW (ref 13.0–17.0)
MCH: 31.1 pg (ref 26.0–34.0)
MCHC: 34.3 g/dL (ref 30.0–36.0)
MCV: 90.9 fL (ref 80.0–100.0)
Platelets: 153 10*3/uL (ref 150–400)
RBC: 3.95 MIL/uL — ABNORMAL LOW (ref 4.22–5.81)
RDW: 14.8 % (ref 11.5–15.5)
WBC: 13.6 10*3/uL — ABNORMAL HIGH (ref 4.0–10.5)
nRBC: 0 % (ref 0.0–0.2)

## 2021-11-17 LAB — BASIC METABOLIC PANEL
Anion gap: 12 (ref 5–15)
BUN: 34 mg/dL — ABNORMAL HIGH (ref 8–23)
CO2: 27 mmol/L (ref 22–32)
Calcium: 8.8 mg/dL — ABNORMAL LOW (ref 8.9–10.3)
Chloride: 93 mmol/L — ABNORMAL LOW (ref 98–111)
Creatinine, Ser: 7.8 mg/dL — ABNORMAL HIGH (ref 0.61–1.24)
GFR, Estimated: 7 mL/min — ABNORMAL LOW (ref >60)
Glucose, Bld: 139 mg/dL — ABNORMAL HIGH (ref 70–99)
Potassium: 5.2 mmol/L — ABNORMAL HIGH (ref 3.5–5.1)
Sodium: 132 mmol/L — ABNORMAL LOW (ref 135–145)

## 2021-11-17 LAB — MAGNESIUM: Magnesium: 1.9 mg/dL (ref 1.7–2.4)

## 2021-11-17 LAB — GLUCOSE, CAPILLARY
Glucose-Capillary: 139 mg/dL — ABNORMAL HIGH (ref 70–99)
Glucose-Capillary: 228 mg/dL — ABNORMAL HIGH (ref 70–99)
Glucose-Capillary: 132 mg/dL — ABNORMAL HIGH (ref 70–99)
Glucose-Capillary: 90 mg/dL (ref 70–99)
Glucose-Capillary: 178 mg/dL — ABNORMAL HIGH (ref 70–99)

## 2021-11-17 LAB — HEPATITIS B SURFACE ANTIBODY, QUANTITATIVE: Hep B S AB Quant (Post): 133.6 m[IU]/mL (ref >9.9)

## 2021-11-17 MED ORDER — SODIUM ZIRCONIUM CYCLOSILICATE 10 G PO PACK
10.0000 g | PACK | Freq: Three times a day (TID) | ORAL | Status: AC
Start: 2021-11-17 — End: 2021-11-17
  Administered 2021-11-17 (×2): 10 g via ORAL
  Filled 2021-11-17 (×2): qty 1

## 2021-11-17 MED ORDER — INSULIN DETEMIR 100 UNIT/ML ~~LOC~~ SOLN
8.0000 [IU] | Freq: Every day | SUBCUTANEOUS | Status: DC
  Filled 2021-11-17: qty 0.08

## 2021-11-17 MED ORDER — CHLORHEXIDINE GLUCONATE CLOTH 2 % EX PADS
6.0000 | MEDICATED_PAD | Freq: Every day | CUTANEOUS | Status: DC
Start: 2021-11-17 — End: 2021-11-18
  Administered 2021-11-17: 6 via TOPICAL

## 2021-11-17 MED ORDER — ASPIRIN 81 MG PO TBEC
81.0000 mg | DELAYED_RELEASE_TABLET | Freq: Every day | ORAL | Status: DC
  Administered 2021-11-17 – 2021-11-18 (×2): 81 mg via ORAL
  Filled 2021-11-17 (×2): qty 1

## 2021-11-17 MED ORDER — AMOXICILLIN-POT CLAVULANATE 875-125 MG PO TABS
1.0000 | ORAL_TABLET | Freq: Two times a day (BID) | ORAL | Status: DC
Start: 2021-11-17 — End: 2021-11-17

## 2021-11-17 MED ORDER — GABAPENTIN 100 MG PO CAPS
200.0000 mg | ORAL_CAPSULE | Freq: Every day | ORAL | Status: DC
  Administered 2021-11-17: 200 mg via ORAL
  Filled 2021-11-17: qty 2

## 2021-11-17 MED ORDER — CLOPIDOGREL BISULFATE 75 MG PO TABS
75.0000 mg | ORAL_TABLET | Freq: Every day | ORAL | Status: DC
  Administered 2021-11-17 – 2021-11-18 (×2): 75 mg via ORAL
  Filled 2021-11-17 (×2): qty 1

## 2021-11-17 MED ORDER — GABAPENTIN 100 MG PO CAPS
100.0000 mg | ORAL_CAPSULE | Freq: Every day | ORAL | Status: DC
  Administered 2021-11-17 – 2021-11-18 (×2): 100 mg via ORAL
  Filled 2021-11-17 (×2): qty 1

## 2021-11-17 MED ORDER — INSULIN DETEMIR 100 UNIT/ML ~~LOC~~ SOLN
5.0000 [IU] | Freq: Every day | SUBCUTANEOUS | Status: DC
  Administered 2021-11-17 – 2021-11-18 (×2): 5 [IU] via SUBCUTANEOUS
  Filled 2021-11-17 (×2): qty 0.05

## 2021-11-17 MED ORDER — AMOXICILLIN-POT CLAVULANATE 500-125 MG PO TABS
1.0000 | ORAL_TABLET | ORAL | Status: DC
  Administered 2021-11-17: 500 mg via ORAL
  Filled 2021-11-17 (×3): qty 1

## 2021-11-17 NOTE — Progress Notes (Signed)
PHARMACY NOTE:  ANTIMICROBIAL RENAL DOSAGE ADJUSTMENT  Current antimicrobial regimen includes a mismatch between antimicrobial dosage and estimated renal function.  As per policy approved by the Pharmacy & Therapeutics and Medical Executive Committees, the antimicrobial dosage will be adjusted accordingly.  Current antimicrobial dosage:  Augmentin 875/125 q12 hours  Indication: Pneumonia   Renal Function:  Estimated Creatinine Clearance: 8.7 mL/min (A) (by C-G formula based on SCr of 7.8 mg/dL (H)). '[x]'$      On intermittent HD, scheduled: MWF  '[]'$      On CRRT    Antimicrobial dosage has been changed to:  Augmentin '500mg'$  q24 hours   Additional comments:   Thank you for allowing pharmacy to be a part of this patient's care.  Vicenta Dunning, PharmD  PGY1 Pharmacy Resident

## 2021-11-17 NOTE — Progress Notes (Signed)
[facility] Progress Note  Subjective: seen in ICU, labs okay. Extubated, eating solids.  Had HD upstairs yesterday w/o incident.   Vitals:   11/17/21 0500 11/17/21 0530 11/17/21 0600 11/17/21 0715  BP: 128/64 (!) 121/57 124/61   Pulse: 66 65 70   Resp: '17 19 19   '$ Temp:    98 F (36.7 C)  TempSrc:    Oral  SpO2: 100% 100% 100%   Weight: 81.5 kg     Height:        Exam: Gen alert, no distress No jvd or bruits Chest clear bilat to bases RRR no MRG Abd soft ntnd no mass or ascites +bs Ext no LE edema Neuro is alert, Ox 3 , nf    LUA AVG+bruit    Home meds include - aspirin, atorvastatin, calcium acetate 2 ac, clopidogrel, gabapentin, midodrine 10 bid, renavite, pantoprazole, prns/ vits/ supps    CXR 8/8 - post arrest, mild basilar atx unchanged, no edema or vasc congestion     OP HD: MWF [facility]  4h  450/ 1.5  78.5kg  2/2 bath P2  LUA AVG Hep 5000 - last hep B labs: 8/8 - last HD 8/7, post wt was 78.5kg - last Hb 13.8 on 8/2, last pth 377 - calcitriol 0.75 ug tiw po     Assessment/ Plan: SP PEA arrest - occurred during fistulogram in IR on 8/8, 3 min to ROSC approximately. Pt recovering well.  Hypotension / shock - post-arrest, resolved. Cont home midodrine 10 bid SP fistulogram - procedure was done to investigate slow flows on HD. Procedure was not completed due to arrest. Access worked well 8/8. ESRD - on HD MWF. Next HD tomorrow.  Volume - euvolemic on exam, 2-3kg up by wts. UFG 2-3L on next HD.  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 Phos is low, hold phoslo for now.  CAD / hx CABG 2018       Jon Anderson 11/17/2021, 7:29 AM   Recent Labs  Lab 11/15/21 1314 11/15/21 1315 11/15/21 1451 11/16/21 0303  HGB 12.4*  --  16.3 14.6  ALBUMIN 2.3*  --   --   --   CALCIUM 6.7*  --   --  9.4  PHOS 3.6 2.7  --  2.0*  CREATININE 7.88*  --   --   10.52*  K 2.8*  --  3.4* 5.0    No results for input(s): "IRON", "TIBC", "FERRITIN" in the last 168 hours. Inpatient medications:  calcitRIOL  0.75 mcg Oral Q M,W,F   Chlorhexidine Gluconate Cloth  6 each Topical Q0600   docusate sodium  100 mg Oral BID   heparin  5,000 Units Subcutaneous Q8H   insulin aspart  0-15 Units Subcutaneous TID AC & HS   insulin detemir  5 Units Subcutaneous Daily   midodrine  10 mg Oral BID WC   pantoprazole  40 mg Oral QHS   polyethylene glycol  17 g Oral Daily    sodium chloride Stopped (11/16/21 2335)   amiodarone     ampicillin-sulbactam (UNASYN) IV Stopped (11/16/21 2251)   norepinephrine (LEVOPHED) Adult infusion Stopped (11/17/21 0050)   sodium chloride, docusate sodium, iohexol, ipratropium-albuterol, mouth rinse, polyethylene glycol

## 2021-11-17 NOTE — Progress Notes (Signed)
Patient signed out to [facility], they will assume care 8/11 at 7AM.

## 2021-11-17 NOTE — Inpatient Diabetes Management (Addendum)
Inpatient Diabetes Program Recommendations  AACE/ADA: New Consensus Statement on Inpatient Glycemic Control (2015)  Target Ranges:  Prepandial:   less than 140 mg/dL      Peak postprandial:   less than 180 mg/dL (1-2 hours)      Critically ill patients:  140 - 180 mg/dL    Latest Reference Range & Units 11/15/21 23:05 11/16/21 03:02 11/16/21 07:13 11/16/21 11:40 11/16/21 15:12 11/16/21 19:05 11/16/21 19:40 11/16/21 22:01 11/16/21 22:07  Glucose-Capillary 70 - 99 mg/dL 241 (H)  3 units Novolog  256 (H)  5 units Novolog 248 (H)  5 units Novolog 180 (H)    5 units Levemir '@1138'$  162 (H)  40 mg Solumedrol X 1 dose '@1714'$  318 (H) 288 (H) 299 (H) 308 (H)  8 units Novolog     Latest Reference Range & Units 11/17/21 07:14 11/17/21 11:42  Glucose-Capillary 70 - 99 mg/dL 139 (H)  2 units Novolog  228 (H)  5 units Novolog  5 units Levemir '@0952'$      Home DM Meds: None listed  Current Orders: Levemir 5 units daily      Novolog Moderate Correction Scale/ SSI (0-15 units) TID AC + HS     Extubated AM 08/09  Note pt was given 40 mg Solumedrol X 1 dose yesterday at 1714--CBGs had sharp rise after steroids given  MD- If pt continues to have post-meal elevations, may consider adding low dose Novolog Meal Coverage:  Novolog 3 units TID with meals HOLD if pt eats <50% meals    --Will follow patient during hospitalization--  Wyn Quaker RN, MSN, [place] Diabetes Coordinator Inpatient Glycemic Control Team Team Pager: [phone] (8a-5p)

## 2021-11-17 NOTE — Progress Notes (Addendum)
Pt receives out-pt HD at [facility] on MWF. Pt arrives at 7:35 for 7:55 chair time. Will assist as needed.   Melven Sartorius Renal Navigator [phone]

## 2021-11-17 NOTE — Progress Notes (Addendum)
NAME:  Jon Anderson, MRN:  [id number], DOB:  Jan 12, 1951, LOS: 2 ADMISSION DATE:  11/15/2021, CONSULTATION DATE:  8/8 REFERRING MD:  Dr. Dwaine Anderson IR, CHIEF COMPLAINT:  Cardiac arrest   History of Present Illness:  71 yo with PMH of ESRD on HD, PVD, DM, and HTN presented to [facility] for elective outpatient revision of left fistulogram due to slow arterial flow 8/8. During the case he woke up complaining of abdominal pain and then suffered cardiac arrest. ROSC was achieved after 1 round of ACLS. He required norepinephrine infusion post code. He required additional sedation and tachyarrhythmia with nonsustained runs of ventricular tachycardia treated with amiodarone. PCCM consulted.  Pertinent  Medical History  ESRD on HD PVD Carotid stenosis Type 2 DM  Significant Hospital Events: Including procedures, antibiotic start and stop dates in addition to other pertinent events   8/8 code in IR during elective fistulagram > admit to ICU on vent, pressors.  8/9 extubated, off of levophed, received HD  Interim History / Subjective:  Denies chest pain, abdominal pain, feels better. Wants to go home.   Objective   Blood pressure 124/70, pulse 66, temperature 97.9 F (36.6 C), temperature source Oral, resp. rate 20, height '5\' 9"'$  (1.753 m), weight 78 kg, SpO2 100 %.        Intake/Output Summary (Last 24 hours) at 11/17/2021 0630 Last data filed at 11/17/2021 0100 Gross per 24 hour  Intake 945.78 ml  Output 0 ml  Net 945.78 ml    Filed Weights   11/15/21 0804 11/16/21 1415  Weight: 78 kg 78 kg    Examination: Constitutional: well-appearing, in no acute distress Neck: central line in place Cardiovascular: regular rate and rhythm, no m/r/g Pulmonary/Chest: normal work of breathing on room air, lungs clear to auscultation bilaterally Abdominal: soft, non-tender, non-distended MSK: normal bulk and tone, bruit in left avf Neurological: alert & oriented x 3 Skin: warm and dry Psych: normal mood  and affect  Na 132 K 5.2 Cl 93 Ca 8.8 BUN 34 WBC improved from 26.1 to 13.6 Hgb down from 14.6 to 12.3 Respiratory stain abundant WBC present, gram negative rods, gram + cocci in pairs  Resolved Hospital Problem list   Hypokalemia Hypomagnesemia Nonsustained ventricular tachycardia Shock   Assessment & Plan:   PEA cardiac arrest, thought to be from contrast anaphylaxis Patient with PEA arrest 8/8. Troponin were minimally elevated yesterday. Cardiology was consulted and thin that cardiac arrest was related to contrast allergy despite being premedicated. No further cardiac work-up. -allergy list updated   Shock- resolved Initial concern was for cardiogenic shock. Echo was reassuring with EF >70%. He was given 1 dose of solumedrol yesterday following concern for PEA 2/2 to contrast. Levophed off overnight and maintaining MAPs.  Home medications include midodrine 10 mg BID. -levophed discontinued  Aspiration pneumonia WBC trending down from 26 to 13 today. Respiratory stain grew abundant WBC present, gram negative rods, gram + cocci in pairs -unasyn switched to augmentin for 5 days total -respiratory culture sensitivities pending  ESRD on HD: MWF last tx 8/9 HD completed in ICU 8/9. HD planned 8/11 per nephro. K elevated at 5.2, lokelma given. -nephrology following  PVD Home medications include plavix and ASA.  -restart ASA and plavix  DM2 He doesn't take any medications for diabetes at home. BG elevated following steroid dose overnight. -changed SSI moderate -levemir 5 units daily -cbg monitoring  Best Practice (right click and "Reselect all SmartList Selections" daily)   Diet/type: renal DVT  prophylaxis: LMWH GI prophylaxis: N/A Lines: N/A Foley:  N/A Code Status:  full code Last date of multidisciplinary goals of care discussion [8/10]   Jon Anderson, D.O.  Internal Medicine Resident, PGY-2 [facility]  Pager:  [phone] 6:30 AM, 11/17/2021

## 2021-11-17 NOTE — TOC Progression Note (Signed)
Transition of Care [facility]) - Progression Note    Patient Details  Name: Jon Anderson MRN: [id number] Date of Birth: [birth year]  Transition of Care [facility]) CM/SW [place], RN Phone Number:[phone]  11/17/2021, 2:01 PM  Clinical Narrative:     Transition of Care [facility]) Screening Note   Patient Details  Name: Jon Anderson Date of Birth: 05-Dec-1950   Transition of Care [facility]) CM/SW Contact:    Angelita Ingles, RN Phone Number: 11/17/2021, 2:01 PM    Transition of Care Department [facility]) has reviewed patient and no TOC needs have been identified at this time. We will continue to monitor patient advancement through interdisciplinary progression rounds.           Expected Discharge Plan and Services                                                 Social Determinants of Health (SDOH) Interventions    Readmission Risk Interventions    11/20/2019    1:27 PM  Readmission Risk Prevention Plan  Transportation Screening Complete  PCP or Specialist Appt within 3-5 Days Complete  HRI or [place] Complete  Social Work Consult for [place] Planning/Counseling Complete  Palliative Care Screening Complete  Medication Review [employer]) Complete

## 2021-11-17 NOTE — Progress Notes (Signed)
Pt transported to 44M room 15 on telemetry. All belongings sent with patient. Wife Stanton Kidney notified of room change.

## 2021-11-17 NOTE — Evaluation (Signed)
Physical Therapy Evaluation Patient Details Name: Jon Anderson MRN: [id number] DOB: February 07, 1951 Today's Date: 11/17/2021  History of Present Illness  71 year old admitted 8/8 status post brief PEA arrest during IR procedure of revising fistula.  VDRF 8/8-8/9.  hx of ESRD on HD, CAD s/p single vessel CABG (LIMA to LAD via off-pump 01/2017), carotid stenosis,  HTN, HLD, PAD s/p peripheral interventions and right TMA , OSA, type 2 DM  Clinical Impression  Pt admitted with above diagnosis. Pt was able to ambulate with min guard to min assist with RW.  Needed mod assist with cane which is what pt used PTA. Pt does have a RW at home and was educated to use the RW intiially until he can gain strength and pt agrees. Will follow acutely.  Pt currently with functional limitations due to the deficits listed below (see PT Problem List). Pt will benefit from skilled PT to increase their independence and safety with mobility to allow discharge to the venue listed below.          Recommendations for follow up therapy are one component of a multi-disciplinary discharge planning process, led by the attending physician.  Recommendations may be updated based on patient status, additional functional criteria and insurance authorization.  Follow Up Recommendations Home health PT      Assistance Recommended at Discharge Intermittent Supervision/Assistance  Patient can return home with the following  A little help with walking and/or transfers;A little help with bathing/dressing/bathroom;Help with stairs or ramp for entrance;Assistance with cooking/housework    Equipment Recommendations None recommended by PT  Recommendations for Other Services       Functional Status Assessment Patient has had a recent decline in their functional status and demonstrates the ability to make significant improvements in function in a reasonable and predictable amount of time.     Precautions / Restrictions  Precautions Precautions: Fall Restrictions Weight Bearing Restrictions: No      Mobility  Bed Mobility Overal bed mobility: Independent                  Transfers Overall transfer level: Needs assistance Equipment used: Straight cane, Rolling walker (2 wheels) Transfers: Sit to/from Stand Sit to Stand: Mod assist, Min assist, From elevated surface           General transfer comment: Pt needed mod assist to come to stand with use of cane as he couldnt get his balance without external support.  With RW, pt able to stand with min assist to power up and min guard assist once standing.    Ambulation/Gait Ambulation/Gait assistance: Min guard, +2 safety/equipment, Min assist Gait Distance (Feet): 220 Feet Assistive device: Rolling walker (2 wheels) Gait Pattern/deviations: Step-through pattern, Decreased stride length, Drifts right/left, Trunk flexed   Gait velocity interpretation: <1.31 ft/sec, indicative of household ambulator   General Gait Details: Pt with slight trunk flexion and does not stand fully upright which pt states is premorbid.  Pt was able to progress ambulation in hallway with cues to stay close to RW and assist at times with steering RW. Needed assist for lines as well.  Stairs            Wheelchair Mobility    Modified Rankin (Stroke Patients Only)       Balance Overall balance assessment: Needs assistance Sitting-balance support: No upper extremity supported, Feet supported Sitting balance-Leahy Scale: Fair     Standing balance support: Bilateral upper extremity supported, During functional activity Standing balance-Leahy Scale: Poor Standing balance  comment: relies on UE support as well as min external support at times especially wiht use of cane                             Pertinent Vitals/Pain Pain Assessment Pain Assessment: No/denies pain    Home Living Family/patient expects to be discharged to:: Private  residence Living Arrangements: Spouse/significant other;Children Available Help at Discharge: Family;Available 24 hours/day Type of Home: House Home Access: Stairs to enter Entrance Stairs-Rails: None Entrance Stairs-Number of Steps: 2   Home Layout: One level Home Equipment: Cane - single point;Rolling Walker (2 wheels);BSC/3in1;Grab bars - tub/shower;Wheelchair - manual      Prior Function Prior Level of Function : Independent/Modified Independent;Driving             Mobility Comments: I with cane per pt ADLs Comments: I per pt with all ADLS     Hand Dominance   Dominant Hand: Right    Extremity/Trunk Assessment   Upper Extremity Assessment Upper Extremity Assessment: Defer to OT evaluation    Lower Extremity Assessment Lower Extremity Assessment: Generalized weakness    Cervical / Trunk Assessment Cervical / Trunk Assessment: Kyphotic  Communication   Communication: No difficulties  Cognition Arousal/Alertness: Awake/alert Behavior During Therapy: WFL for tasks assessed/performed Overall Cognitive Status: Within Functional Limits for tasks assessed                                          General Comments General comments (skin integrity, edema, etc.): VSS on RA    Exercises General Exercises - Lower Extremity Ankle Circles/Pumps: AROM, Both, 10 reps, Seated Long Arc Quad: AROM, Both, 10 reps, Seated Hip Flexion/Marching: AROM, Both, 10 reps, Seated   Assessment/Plan    PT Assessment Patient needs continued PT services  PT Problem List Decreased activity tolerance;Decreased balance;Decreased mobility;Decreased knowledge of use of DME;Decreased safety awareness;Decreased knowledge of precautions;Cardiopulmonary status limiting activity       PT Treatment Interventions DME instruction;Gait training;Functional mobility training;Therapeutic activities;Therapeutic exercise;Balance training;Patient/family education;Stair training    PT  Goals (Current goals can be found in the Care Plan section)  Acute Rehab PT Goals Patient Stated Goal: to go home PT Goal Formulation: With patient Time For Goal Achievement: 12/01/21 Potential to Achieve Goals: Good    Frequency Min 3X/week     Co-evaluation               AM-PAC PT "6 Clicks" Mobility  Outcome Measure Help needed turning from your back to your side while in a flat bed without using bedrails?: None Help needed moving from lying on your back to sitting on the side of a flat bed without using bedrails?: None Help needed moving to and from a bed to a chair (including a wheelchair)?: A Little Help needed standing up from a chair using your arms (e.g., wheelchair or bedside chair)?: A Lot Help needed to walk in hospital room?: A Little Help needed climbing 3-5 steps with a railing? : A Little 6 Click Score: 19    End of Session Equipment Utilized During Treatment: Gait belt Activity Tolerance: Patient limited by fatigue Patient left: in chair;with call bell/phone within reach;with chair alarm set Nurse Communication: Mobility status PT Visit Diagnosis: Unsteadiness on feet (R26.81);Muscle weakness (generalized) (M62.81)    Time: 4650-3546 PT Time Calculation (min) (ACUTE ONLY): 23 min  Charges:   PT Evaluation $PT Eval Moderate Complexity: 1 Mod PT Treatments $Gait Training: 8-22 mins        [facility] M,PT Acute Rehab Services [place] 11/17/2021, 12:48 PM

## 2021-11-18 ENCOUNTER — Other Ambulatory Visit (HOSPITAL_COMMUNITY): Payer: Self-pay

## 2021-11-18 LAB — GLUCOSE, CAPILLARY
Glucose-Capillary: 103 mg/dL — ABNORMAL HIGH (ref 70–99)
Glucose-Capillary: 112 mg/dL — ABNORMAL HIGH (ref 70–99)
Glucose-Capillary: 148 mg/dL — ABNORMAL HIGH (ref 70–99)

## 2021-11-18 LAB — CULTURE, RESPIRATORY W GRAM STAIN: Culture: NORMAL

## 2021-11-18 LAB — CBC
HCT: 33.2 % — ABNORMAL LOW (ref 39.0–52.0)
Hemoglobin: 10.8 g/dL — ABNORMAL LOW (ref 13.0–17.0)
MCH: 30.6 pg (ref 26.0–34.0)
MCHC: 32.5 g/dL (ref 30.0–36.0)
MCV: 94.1 fL (ref 80.0–100.0)
Platelets: 142 10*3/uL — ABNORMAL LOW (ref 150–400)
RBC: 3.53 MIL/uL — ABNORMAL LOW (ref 4.22–5.81)
RDW: 14.8 % (ref 11.5–15.5)
WBC: 10 10*3/uL (ref 4.0–10.5)
nRBC: 0 % (ref 0.0–0.2)

## 2021-11-18 LAB — RENAL FUNCTION PANEL
Albumin: 2.8 g/dL — ABNORMAL LOW (ref 3.5–5.0)
Anion gap: 13 (ref 5–15)
BUN: 49 mg/dL — ABNORMAL HIGH (ref 8–23)
CO2: 25 mmol/L (ref 22–32)
Calcium: 7.9 mg/dL — ABNORMAL LOW (ref 8.9–10.3)
Chloride: 96 mmol/L — ABNORMAL LOW (ref 98–111)
Creatinine, Ser: 9.36 mg/dL — ABNORMAL HIGH (ref 0.61–1.24)
GFR, Estimated: 5 mL/min — ABNORMAL LOW (ref >60)
Glucose, Bld: 129 mg/dL — ABNORMAL HIGH (ref 70–99)
Phosphorus: 5.1 mg/dL — ABNORMAL HIGH (ref 2.5–4.6)
Potassium: 3.5 mmol/L (ref 3.5–5.1)
Sodium: 134 mmol/L — ABNORMAL LOW (ref 135–145)

## 2021-11-18 MED ORDER — ACETAMINOPHEN 325 MG PO TABS
650.0000 mg | ORAL_TABLET | Freq: Four times a day (QID) | ORAL | Status: DC | PRN
Start: 2021-11-18 — End: 2021-11-18
  Administered 2021-11-18: 650 mg via ORAL

## 2021-11-18 MED ORDER — HEPARIN SODIUM (PORCINE) 1000 UNIT/ML DIALYSIS: 2500.0000 [IU] | INTRAMUSCULAR | Status: DC | PRN

## 2021-11-18 MED ORDER — AMOXICILLIN-POT CLAVULANATE 500-125 MG PO TABS
1.0000 | ORAL_TABLET | ORAL | 0 refills | Status: AC
  Filled 2021-11-18: qty 4, 4d supply, fill #0

## 2021-11-18 MED ORDER — ACETAMINOPHEN 325 MG PO TABS
ORAL_TABLET | ORAL | Status: AC
  Filled 2021-11-18: qty 2

## 2021-11-18 NOTE — Progress Notes (Signed)
Discharge instructions (including medications) discussed with and copy provided to patient/caregiver 

## 2021-11-18 NOTE — Discharge Summary (Signed)
Physician Discharge Summary   Patient: Jon Anderson MRN: [id number] DOB: [birth year]  Admit date:     11/15/2021  Discharge date: 11/18/21  Discharge Physician: Marylu Lund   PCP: Juanito Doom, MD   Recommendations at discharge:    Follow up with PCP in 1-2 weeks F/u with Cardiology as scheduled  Discharge Diagnoses: Principal Problem:   Cardiac arrest [facility]) Active Problems:   ESRD (end stage renal disease) ([place])  Resolved Problems:   * No resolved hospital problems. *  Hospital Course: 71 yo with PMH of ESRD on HD, PVD, DM, and HTN presented to [facility] for elective outpatient revision of left fistulogram due to slow arterial flow 8/8. During the case he woke up complaining of abdominal pain and then suffered cardiac arrest. ROSC was achieved after 1 round of ACLS. He required norepinephrine infusion post code. He required additional sedation and tachyarrhythmia with nonsustained runs of ventricular tachycardia treated with amiodarone.  Assessment and Plan: PEA cardiac arrest, thought to be from contrast anaphylaxis -Patient with PEA arrest 8/8. Troponin were minimally elevated yesterday.  -Cardiology was consulted and thin that cardiac arrest was related to contrast allergy despite being premedicated.  -No further cardiac work-up per Cardiology. Pt to f/u with Cardiology after discharge   Shock- resolved Initial concern was for cardiogenic shock. Echo was reassuring with EF >70%. He was given 1 dose of solumedrol yesterday following concern for PEA 2/2 to contrast. Levophed off overnight and maintaining MAPs.  Home medications include midodrine 10 mg BID. -levophed discontinued -BP remained stable afterwards   Aspiration pneumonia WBC trending down from 26 to 13 today. Respiratory stain grew abundant WBC present, gram negative rods, gram + cocci in pairs -unasyn switched to augmentin for 5 days total   ESRD on HD: MWF last tx 8/9 HD completed in ICU 8/9. HD planned  8/11 per nephro. K elevated at 5.2, lokelma given. -Potassium normalized -nephrology following   PVD Home medications include plavix and ASA.  -restarted ASA and plavix   DM2 He doesn't take any medications for diabetes at home. BG elevated following steroid dose overnight. -changed SSI moderate while in hospital -levemir 5 units daily while in hospital -continue home meds on d/c        Consultants: PCCM, Nephrology Procedures performed:   Disposition: Home Diet recommendation:  Renal diet DISCHARGE MEDICATION: Allergies as of 11/18/2021       Reactions   Contrast Media [iodinated Contrast Media] Anaphylaxis   Cardiac arrest, Throat swelling, hives, SOB   Kiwi Extract Itching, Swelling, Other (See Comments)   Lips and face swell- breathing not affected   Flexeril [cyclobenzaprine]    Hands become flimsy, can not hold things   Tape Other (See Comments)   "Plastic" tape causes blisters!!        Medication List     TAKE these medications    acetaminophen 500 MG tablet Commonly known as: TYLENOL Take 1,000 mg by mouth every 6 (six) hours as needed for moderate pain.   amoxicillin-clavulanate 500-125 MG tablet Commonly known as: AUGMENTIN Take 1 tablet (500 mg total) by mouth daily for 4 days.   aspirin EC 81 MG tablet Take 1 tablet (81 mg total) by mouth daily.   atorvastatin 40 MG tablet Commonly known as: LIPITOR Take 1 tablet (40 mg total) by mouth daily at 6 PM.   CALCITRIOL PO Take 1 tablet by mouth every Monday, Wednesday, and Friday.   calcium acetate 667 MG capsule Commonly known  as: PHOSLO Take 2 capsules (1,334 mg total) by mouth 2 (two) times daily with a meal.   clopidogrel 75 MG tablet Commonly known as: PLAVIX Take 75 mg by mouth daily.   gabapentin 100 MG capsule Commonly known as: NEURONTIN Take 100-200 mg by mouth See admin instructions. 100 mg in the morning, 200 mg in the evening   loperamide 2 MG capsule Commonly known as:  IMODIUM Take 2 mg by mouth as needed for diarrhea or loose stools.   midodrine 10 MG tablet Commonly known as: PROAMATINE Take 10 mg by mouth in the morning and at bedtime.   multivitamin Tabs tablet Take 1 tablet by mouth at bedtime.   OneTouch Ultra test strip Generic drug: glucose blood See admin instructions.   pantoprazole 40 MG tablet Commonly known as: PROTONIX Take 40 mg by mouth daily.   PARSABIV IV Etelcalcetide (Parsabiv)   predniSONE 50 MG tablet Commonly known as: DELTASONE Take one tablet at 13 hours, 7 hours and 1 hour prior to your procedure.   senna-docusate 8.6-50 MG tablet Commonly known as: Senokot-S Take 2 tablets by mouth 2 (two) times daily as needed for mild constipation.        Follow-up Information     Juanito Doom, MD Follow up in 1 week(s).   Specialty: Pulmonary Disease Why: Hospital follow up Contact information: [place] [address] [phone]         Lorretta Harp, MD .   Specialties: Cardiology, Radiology Contact information: [address] [place] [address] [phone]                Discharge Exam: Danley Danker Weights   11/17/21 1729 11/18/21 0838 11/18/21 1332  Weight: 84 kg 84.3 kg 82 kg   General exam: Awake, laying in bed, in nad Respiratory system: Normal respiratory effort, no wheezing Cardiovascular system: regular rate, s1, s2 Gastrointestinal system: Soft, nondistended, positive BS Central nervous system: CN2-12 grossly intact, strength intact Extremities: Perfused, no clubbing Skin: Normal skin turgor, no notable skin lesions seen Psychiatry: Mood normal // no visual hallucinations    Condition at discharge: fair  The results of significant diagnostics from this hospitalization (including imaging, microbiology, ancillary and laboratory) are listed below for reference.   Imaging Studies: DG Chest Port 1 View  Result Date: 11/16/2021 CLINICAL  DATA:  Intubated. EXAM: PORTABLE CHEST 1 VIEW COMPARISON:  11/15/2021 FINDINGS: Normal sized heart. Endotracheal tube in satisfactory position. Nasogastric tube tip and side hole in the proximal stomach. Poor inspiration with increased bibasilar atelectasis. Tortuous and calcified thoracic aorta. Lower thoracic spine degenerative changes. IMPRESSION: Poor inspiration with increased bibasilar atelectasis. Electronically Signed   By: Claudie Revering M.D.   On: 11/16/2021 08:17   IR AV DIALY SHUNT INTRO NEEDLE/INTRACATH INITIAL W/PTA/IMG LEFT  Result Date: 11/15/2021 INDICATION: 71 year old gentleman with revised left upper extremity dialysis graft presents to IR for fistulogram and possible intervention given suboptimal dialysis. EXAM: 1. Ultrasound-guided antegrade and retrograde access of left upper extremity AV graft 2. Left upper extremity fistulagram 3. Angioplasty of arterial and venous anastomotic stenoses 4. Left common femoral vein non tunneled central line placement with ultrasound guidance 5. Right radial arterial line placement with ultrasound guidance MEDICATIONS: None. ANESTHESIA/SEDATION: Moderate (conscious) sedation was employed during this procedure. A total of Versed 1 mg and Fentanyl 25 mcg was administered intravenously by the radiology nurse. Total intra-service moderate Sedation Time: 40 minutes. The patient's level of consciousness and vital  signs were monitored continuously by radiology nursing throughout the procedure under my direct supervision. FLUOROSCOPY: Radiation Exposure Index (as provided by the fluoroscopic device): 26 mGy Kerma COMPLICATIONS: SIR LEVEL D - Requires major therapy, prolonged hospitalization (>48 hours). PROCEDURE: Informed written consent was obtained from the patient after a thorough discussion of the procedural risks, benefits and alternatives. All questions were addressed. Maximal Sterile Barrier Technique was utilized including caps, mask, sterile gowns, sterile  gloves, sterile drape, hand hygiene and skin antiseptic. A timeout was performed prior to the initiation of the procedure. Ultrasound image documenting patency of the graft was obtained and placed in permanent medical record. Sterile ultrasound probe cover and gel utilized throughout the procedure. Utilizing continuous ultrasound guidance, the left upper extremity AV graft was accessed in a retrograde direction with a 21 gauge needle. 21 gauge needle exchanged for a transitional dilator set over 0.018 inch guidewire. Transitional dilator set exchanged for 6 French sheath over 0.035 inch guidewire. Fistulagram demonstrated moderate long segment stenosis of the proximal venous outflow and anastomosis. Moderate hemodynamically significant focal stenosis also noted at the venous anastomosis. No significant central stenosis was identified. The proximal venous outflow stenosis was crossed with 0.035 inch glidewire. The stenosis was angioplastied with 6 mm balloon resulting in excellent luminal gain and improved flow. The retrograde access was removed and hemostasis achieved with manual compression. Ultrasound image documenting patency of the graft was again obtained and placed in permanent medical record. Sterile ultrasound probe cover and gel utilized throughout the procedure. Utilizing continuous ultrasound guidance, the left upper extremity AV graft was accessed in a antegrade direction with a 21 gauge needle. 21 gauge needle exchanged for a transitional dilator set over 0.018 inch guidewire. Transitional dilator set exchanged for 6 French sheath over 0.035 inch guidewire. The moderate hemodynamically significant stenosis of the venous anastomosis was plastied and with an 8 mm balloon. Post plasty venogram showed moderate luminal gain. At this point the patient began to complain of severe abdominal pain and nausea and lost consciousness. He became tachycardic and hypotensive, and his pulse was difficult to palpate. The  procedure was terminated and code blue was initiated. Code team requested central venous access an arterial line. Decision was made to obtain central venous access at the left groin. Ultrasound image documenting patency of the left common femoral vein was obtained and placed in permanent medical record. Sterile ultrasound probe cover and gel utilized throughout the procedure. Utilizing continuous ultrasound guidance, the left common femoral vein was accessed with a 21 gauge needle. 21 gauge needle exchanged for a transitional dilator set over 0.018 inch guidewire. Transitional dilator set was removed over 0.035 inch guidewire and serial dilation was performed. Triple-lumen central venous catheter was inserted. All lumens aspirated and flushed well. The catheter was secured to skin with suture and covered with bio patch and sterile dressing. Ultrasound image documenting patency of the right radial artery was obtained. Sterile ultrasound probe cover and gel were utilized throughout the procedure. Utilizing continued ultrasound guidance, the right radial artery was accessed at the level of the distal forearm with 18 gauge Angiocath. Angiocath was secured to skin with tape and connected to arterial line monitor. IMPRESSION: 1. Left upper extremity AV graft evaluation shows stenoses of the arterial and venous anastomoses which were respectively treated with 6 and 8 mm balloons. 2. Procedure complicated by cardiac arrest requiring CPR and intubation. Patient transferred to the ICU for further management. ACCESS: This access remains amenable to future percutaneous interventions as clinically  indicated. Electronically Signed   By: Miachel Roux M.D.   On: 11/15/2021 17:37   IR US Guide Vasc Access Left  Result Date: 11/15/2021 INDICATION: 71 year old gentleman with revised left upper extremity dialysis graft presents to IR for fistulogram and possible intervention given suboptimal dialysis. EXAM: 1. Ultrasound-guided  antegrade and retrograde access of left upper extremity AV graft 2. Left upper extremity fistulagram 3. Angioplasty of arterial and venous anastomotic stenoses 4. Left common femoral vein non tunneled central line placement with ultrasound guidance 5. Right radial arterial line placement with ultrasound guidance MEDICATIONS: None. ANESTHESIA/SEDATION: Moderate (conscious) sedation was employed during this procedure. A total of Versed 1 mg and Fentanyl 25 mcg was administered intravenously by the radiology nurse. Total intra-service moderate Sedation Time: 40 minutes. The patient's level of consciousness and vital signs were monitored continuously by radiology nursing throughout the procedure under my direct supervision. FLUOROSCOPY: Radiation Exposure Index (as provided by the fluoroscopic device): 26 mGy Kerma COMPLICATIONS: SIR LEVEL D - Requires major therapy, prolonged hospitalization (>48 hours). PROCEDURE: Informed written consent was obtained from the patient after a thorough discussion of the procedural risks, benefits and alternatives. All questions were addressed. Maximal Sterile Barrier Technique was utilized including caps, mask, sterile gowns, sterile gloves, sterile drape, hand hygiene and skin antiseptic. A timeout was performed prior to the initiation of the procedure. Ultrasound image documenting patency of the graft was obtained and placed in permanent medical record. Sterile ultrasound probe cover and gel utilized throughout the procedure. Utilizing continuous ultrasound guidance, the left upper extremity AV graft was accessed in a retrograde direction with a 21 gauge needle. 21 gauge needle exchanged for a transitional dilator set over 0.018 inch guidewire. Transitional dilator set exchanged for 6 French sheath over 0.035 inch guidewire. Fistulagram demonstrated moderate long segment stenosis of the proximal venous outflow and anastomosis. Moderate hemodynamically significant focal stenosis also  noted at the venous anastomosis. No significant central stenosis was identified. The proximal venous outflow stenosis was crossed with 0.035 inch glidewire. The stenosis was angioplastied with 6 mm balloon resulting in excellent luminal gain and improved flow. The retrograde access was removed and hemostasis achieved with manual compression. Ultrasound image documenting patency of the graft was again obtained and placed in permanent medical record. Sterile ultrasound probe cover and gel utilized throughout the procedure. Utilizing continuous ultrasound guidance, the left upper extremity AV graft was accessed in a antegrade direction with a 21 gauge needle. 21 gauge needle exchanged for a transitional dilator set over 0.018 inch guidewire. Transitional dilator set exchanged for 6 French sheath over 0.035 inch guidewire. The moderate hemodynamically significant stenosis of the venous anastomosis was plastied and with an 8 mm balloon. Post plasty venogram showed moderate luminal gain. At this point the patient began to complain of severe abdominal pain and nausea and lost consciousness. He became tachycardic and hypotensive, and his pulse was difficult to palpate. The procedure was terminated and code blue was initiated. Code team requested central venous access an arterial line. Decision was made to obtain central venous access at the left groin. Ultrasound image documenting patency of the left common femoral vein was obtained and placed in permanent medical record. Sterile ultrasound probe cover and gel utilized throughout the procedure. Utilizing continuous ultrasound guidance, the left common femoral vein was accessed with a 21 gauge needle. 21 gauge needle exchanged for a transitional dilator set over 0.018 inch guidewire. Transitional dilator set was removed over 0.035 inch guidewire and serial dilation was performed. Triple-lumen  central venous catheter was inserted. All lumens aspirated and flushed well. The  catheter was secured to skin with suture and covered with bio patch and sterile dressing. Ultrasound image documenting patency of the right radial artery was obtained. Sterile ultrasound probe cover and gel were utilized throughout the procedure. Utilizing continued ultrasound guidance, the right radial artery was accessed at the level of the distal forearm with 18 gauge Angiocath. Angiocath was secured to skin with tape and connected to arterial line monitor. IMPRESSION: 1. Left upper extremity AV graft evaluation shows stenoses of the arterial and venous anastomoses which were respectively treated with 6 and 8 mm balloons. 2. Procedure complicated by cardiac arrest requiring CPR and intubation. Patient transferred to the ICU for further management. ACCESS: This access remains amenable to future percutaneous interventions as clinically indicated. Electronically Signed   By: Miachel Roux M.D.   On: 11/15/2021 17:37   IR US Guide Vasc Access Right  Result Date: 11/15/2021 INDICATION: 71 year old gentleman with revised left upper extremity dialysis graft presents to IR for fistulogram and possible intervention given suboptimal dialysis. EXAM: 1. Ultrasound-guided antegrade and retrograde access of left upper extremity AV graft 2. Left upper extremity fistulagram 3. Angioplasty of arterial and venous anastomotic stenoses 4. Left common femoral vein non tunneled central line placement with ultrasound guidance 5. Right radial arterial line placement with ultrasound guidance MEDICATIONS: None. ANESTHESIA/SEDATION: Moderate (conscious) sedation was employed during this procedure. A total of Versed 1 mg and Fentanyl 25 mcg was administered intravenously by the radiology nurse. Total intra-service moderate Sedation Time: 40 minutes. The patient's level of consciousness and vital signs were monitored continuously by radiology nursing throughout the procedure under my direct supervision. FLUOROSCOPY: Radiation Exposure  Index (as provided by the fluoroscopic device): 26 mGy Kerma COMPLICATIONS: SIR LEVEL D - Requires major therapy, prolonged hospitalization (>48 hours). PROCEDURE: Informed written consent was obtained from the patient after a thorough discussion of the procedural risks, benefits and alternatives. All questions were addressed. Maximal Sterile Barrier Technique was utilized including caps, mask, sterile gowns, sterile gloves, sterile drape, hand hygiene and skin antiseptic. A timeout was performed prior to the initiation of the procedure. Ultrasound image documenting patency of the graft was obtained and placed in permanent medical record. Sterile ultrasound probe cover and gel utilized throughout the procedure. Utilizing continuous ultrasound guidance, the left upper extremity AV graft was accessed in a retrograde direction with a 21 gauge needle. 21 gauge needle exchanged for a transitional dilator set over 0.018 inch guidewire. Transitional dilator set exchanged for 6 French sheath over 0.035 inch guidewire. Fistulagram demonstrated moderate long segment stenosis of the proximal venous outflow and anastomosis. Moderate hemodynamically significant focal stenosis also noted at the venous anastomosis. No significant central stenosis was identified. The proximal venous outflow stenosis was crossed with 0.035 inch glidewire. The stenosis was angioplastied with 6 mm balloon resulting in excellent luminal gain and improved flow. The retrograde access was removed and hemostasis achieved with manual compression. Ultrasound image documenting patency of the graft was again obtained and placed in permanent medical record. Sterile ultrasound probe cover and gel utilized throughout the procedure. Utilizing continuous ultrasound guidance, the left upper extremity AV graft was accessed in a antegrade direction with a 21 gauge needle. 21 gauge needle exchanged for a transitional dilator set over 0.018 inch guidewire. Transitional  dilator set exchanged for 6 French sheath over 0.035 inch guidewire. The moderate hemodynamically significant stenosis of the venous anastomosis was plastied and with an 8 mm  balloon. Post plasty venogram showed moderate luminal gain. At this point the patient began to complain of severe abdominal pain and nausea and lost consciousness. He became tachycardic and hypotensive, and his pulse was difficult to palpate. The procedure was terminated and code blue was initiated. Code team requested central venous access an arterial line. Decision was made to obtain central venous access at the left groin. Ultrasound image documenting patency of the left common femoral vein was obtained and placed in permanent medical record. Sterile ultrasound probe cover and gel utilized throughout the procedure. Utilizing continuous ultrasound guidance, the left common femoral vein was accessed with a 21 gauge needle. 21 gauge needle exchanged for a transitional dilator set over 0.018 inch guidewire. Transitional dilator set was removed over 0.035 inch guidewire and serial dilation was performed. Triple-lumen central venous catheter was inserted. All lumens aspirated and flushed well. The catheter was secured to skin with suture and covered with bio patch and sterile dressing. Ultrasound image documenting patency of the right radial artery was obtained. Sterile ultrasound probe cover and gel were utilized throughout the procedure. Utilizing continued ultrasound guidance, the right radial artery was accessed at the level of the distal forearm with 18 gauge Angiocath. Angiocath was secured to skin with tape and connected to arterial line monitor. IMPRESSION: 1. Left upper extremity AV graft evaluation shows stenoses of the arterial and venous anastomoses which were respectively treated with 6 and 8 mm balloons. 2. Procedure complicated by cardiac arrest requiring CPR and intubation. Patient transferred to the ICU for further management.  ACCESS: This access remains amenable to future percutaneous interventions as clinically indicated. Electronically Signed   By: Miachel Roux M.D.   On: 11/15/2021 17:37   IR US Guide Vasc Access Left  Result Date: 11/15/2021 INDICATION: 71 year old gentleman with revised left upper extremity dialysis graft presents to IR for fistulogram and possible intervention given suboptimal dialysis. EXAM: 1. Ultrasound-guided antegrade and retrograde access of left upper extremity AV graft 2. Left upper extremity fistulagram 3. Angioplasty of arterial and venous anastomotic stenoses 4. Left common femoral vein non tunneled central line placement with ultrasound guidance 5. Right radial arterial line placement with ultrasound guidance MEDICATIONS: None. ANESTHESIA/SEDATION: Moderate (conscious) sedation was employed during this procedure. A total of Versed 1 mg and Fentanyl 25 mcg was administered intravenously by the radiology nurse. Total intra-service moderate Sedation Time: 40 minutes. The patient's level of consciousness and vital signs were monitored continuously by radiology nursing throughout the procedure under my direct supervision. FLUOROSCOPY: Radiation Exposure Index (as provided by the fluoroscopic device): 26 mGy Kerma COMPLICATIONS: SIR LEVEL D - Requires major therapy, prolonged hospitalization (>48 hours). PROCEDURE: Informed written consent was obtained from the patient after a thorough discussion of the procedural risks, benefits and alternatives. All questions were addressed. Maximal Sterile Barrier Technique was utilized including caps, mask, sterile gowns, sterile gloves, sterile drape, hand hygiene and skin antiseptic. A timeout was performed prior to the initiation of the procedure. Ultrasound image documenting patency of the graft was obtained and placed in permanent medical record. Sterile ultrasound probe cover and gel utilized throughout the procedure. Utilizing continuous ultrasound guidance, the  left upper extremity AV graft was accessed in a retrograde direction with a 21 gauge needle. 21 gauge needle exchanged for a transitional dilator set over 0.018 inch guidewire. Transitional dilator set exchanged for 6 French sheath over 0.035 inch guidewire. Fistulagram demonstrated moderate long segment stenosis of the proximal venous outflow and anastomosis. Moderate hemodynamically significant focal stenosis  also noted at the venous anastomosis. No significant central stenosis was identified. The proximal venous outflow stenosis was crossed with 0.035 inch glidewire. The stenosis was angioplastied with 6 mm balloon resulting in excellent luminal gain and improved flow. The retrograde access was removed and hemostasis achieved with manual compression. Ultrasound image documenting patency of the graft was again obtained and placed in permanent medical record. Sterile ultrasound probe cover and gel utilized throughout the procedure. Utilizing continuous ultrasound guidance, the left upper extremity AV graft was accessed in a antegrade direction with a 21 gauge needle. 21 gauge needle exchanged for a transitional dilator set over 0.018 inch guidewire. Transitional dilator set exchanged for 6 French sheath over 0.035 inch guidewire. The moderate hemodynamically significant stenosis of the venous anastomosis was plastied and with an 8 mm balloon. Post plasty venogram showed moderate luminal gain. At this point the patient began to complain of severe abdominal pain and nausea and lost consciousness. He became tachycardic and hypotensive, and his pulse was difficult to palpate. The procedure was terminated and code blue was initiated. Code team requested central venous access an arterial line. Decision was made to obtain central venous access at the left groin. Ultrasound image documenting patency of the left common femoral vein was obtained and placed in permanent medical record. Sterile ultrasound probe cover and gel  utilized throughout the procedure. Utilizing continuous ultrasound guidance, the left common femoral vein was accessed with a 21 gauge needle. 21 gauge needle exchanged for a transitional dilator set over 0.018 inch guidewire. Transitional dilator set was removed over 0.035 inch guidewire and serial dilation was performed. Triple-lumen central venous catheter was inserted. All lumens aspirated and flushed well. The catheter was secured to skin with suture and covered with bio patch and sterile dressing. Ultrasound image documenting patency of the right radial artery was obtained. Sterile ultrasound probe cover and gel were utilized throughout the procedure. Utilizing continued ultrasound guidance, the right radial artery was accessed at the level of the distal forearm with 18 gauge Angiocath. Angiocath was secured to skin with tape and connected to arterial line monitor. IMPRESSION: 1. Left upper extremity AV graft evaluation shows stenoses of the arterial and venous anastomoses which were respectively treated with 6 and 8 mm balloons. 2. Procedure complicated by cardiac arrest requiring CPR and intubation. Patient transferred to the ICU for further management. ACCESS: This access remains amenable to future percutaneous interventions as clinically indicated. Electronically Signed   By: Miachel Roux M.D.   On: 11/15/2021 17:37   CT ABDOMEN PELVIS WO CONTRAST  Result Date: 11/15/2021 CLINICAL DATA:  Abdominal pain. EXAM: CT ABDOMEN AND PELVIS WITHOUT CONTRAST TECHNIQUE: Multidetector CT imaging of the abdomen and pelvis was performed following the standard protocol without IV contrast. RADIATION DOSE REDUCTION: This exam was performed according to the departmental dose-optimization program which includes automated exposure control, adjustment of the mA and/or kV according to patient size and/or use of iterative reconstruction technique. COMPARISON:  04/24/2019. FINDINGS: Lower chest: Coronary artery calcification.  Heart is at the upper limits of normal in size. No pericardial effusion. Atherosclerotic calcification of the aorta, aortic valve and coronary arteries. Nasogastric tube is seen in the distal esophagus, extending into the stomach. Hepatobiliary: Liver and gallbladder are unremarkable. No biliary ductal dilatation. Pancreas: Negative. Spleen: Negative. Adrenals/Urinary Tract: Low-attenuation thickening of the lateral limb right adrenal gland measures 3 Hounsfield units, unchanged. No follow-up necessary. 1.8 cm nodule in the left adrenal gland measures 15 Hounsfield units, unchanged. No follow-up necessary.  Kidneys are atrophic and contain low-attenuation lesions measuring up to 1.4 cm on the right. No specific follow-up necessary. No urinary stones. Ureters are decompressed. Bladder is low in volume. Stomach/Bowel: Nasogastric tube terminates in body of the stomach. Stomach, small bowel, appendix and colon are otherwise unremarkable. Vascular/Lymphatic: Atherosclerotic calcification of the aorta. Left femoral central line terminates in the left common iliac vein. No pathologically enlarged lymph nodes. Reproductive: Prostate is mildly prominent. Other: Trace ascites. Mesenteries and peritoneum are otherwise unremarkable. Right inguinal hernia repair. Musculoskeletal: Degenerative and postoperative changes in the spine. Bilateral hip osteoarthritis. IMPRESSION: 1. No acute findings in the abdomen or pelvis to explain the patient's pain. 2. Collapse/consolidation in the right lower lobe. Difficult to exclude pneumonia. 3. Small left pleural effusion with left lower lobe subsegmental atelectasis. 4. Bilateral adrenal adenomas. 5. Trace ascites. 6. Prostate is mildly prominent. 7.  Aortic atherosclerosis (ICD10-I70.0). Electronically Signed   By: Lorin Picket M.D.   On: 11/15/2021 16:30   ECHOCARDIOGRAM COMPLETE  Result Date: 11/15/2021    ECHOCARDIOGRAM REPORT   Patient Name:   QUINTEL MCCALLA Date of Exam:  11/15/2021 Medical Rec #:  [id number]          Height:       69.0 in Accession #:    [id number]         Weight:       172.0 lb Date of Birth:  July 05, 1950          BSA:          1.938 m Patient Age:    14 years           BP:           100/60 mmHg Patient Gender: M                  HR:           105 bpm. Exam Location:  Inpatient Procedure: 2D Echo, Color Doppler, Cardiac Doppler and Intracardiac            Opacification Agent Indications:    Cardiac Arrest i46.9  History:        Patient has prior history of Echocardiogram examinations, most                 recent 02/23/2018. CHF, Prior CABG; Risk Factors:Hypertension,                 Diabetes, Dyslipidemia and ESRD.  Sonographer:    Raquel Sarna Senior RDCS Referring Phys: [phone] Silvestre Moment [facility]  Sonographer Comments: Scanned upright on artificial respirator. Parasternals and SSN limited by ET tube. IMPRESSIONS  1. Left ventricular ejection fraction, by estimation, is >75%. The left ventricle has hyperdynamic function. The left ventricle has no regional wall motion abnormalities. There is moderate concentric left ventricular hypertrophy. Left ventricular diastolic parameters are consistent with Grade I diastolic dysfunction (impaired relaxation). Elevated left atrial pressure.  2. Right ventricular systolic function is normal. The right ventricular size is normal. Tricuspid regurgitation signal is inadequate for assessing PA pressure.  3. Left atrial size was mildly dilated.  4. The mitral valve is degenerative. No evidence of mitral valve regurgitation. Mild mitral stenosis. The mean mitral valve gradient is 7.0 mmHg with average heart rate of 118 bpm. Severe mitral annular calcification.  5. The aortic valve is tricuspid. There is mild calcification of the aortic valve. There is mild thickening of the aortic valve. Aortic valve regurgitation is not visualized. Aortic valve sclerosis is  present, with no evidence of aortic valve stenosis. Comparison(s): No significant change from  prior study. Prior images reviewed side by side. FINDINGS  Left Ventricle: Left ventricular ejection fraction, by estimation, is >75%. The left ventricle has hyperdynamic function. The left ventricle has no regional wall motion abnormalities. Definity contrast agent was given IV to delineate the left ventricular endocardial borders. The left ventricular internal cavity size was small. There is moderate concentric left ventricular hypertrophy. Left ventricular diastolic parameters are consistent with Grade I diastolic dysfunction (impaired relaxation). Elevated left atrial pressure. Right Ventricle: The right ventricular size is normal. Right vetricular wall thickness was not well visualized. Right ventricular systolic function is normal. Tricuspid regurgitation signal is inadequate for assessing PA pressure. Left Atrium: Left atrial size was mildly dilated. Right Atrium: Right atrial size was normal in size. Pericardium: There is no evidence of pericardial effusion. Mitral Valve: The mitral valve is degenerative in appearance. Severe mitral annular calcification. No evidence of mitral valve regurgitation. Mild mitral valve stenosis. MV peak gradient, 16.0 mmHg. The mean mitral valve gradient is 7.0 mmHg with average  heart rate of 118 bpm. Tricuspid Valve: The tricuspid valve is normal in structure. Tricuspid valve regurgitation is not demonstrated. Aortic Valve: The aortic valve is tricuspid. There is mild calcification of the aortic valve. There is mild thickening of the aortic valve. Aortic valve regurgitation is not visualized. Aortic valve sclerosis is present, with no evidence of aortic valve stenosis. Pulmonic Valve: The pulmonic valve was not well visualized. Pulmonic valve regurgitation is not visualized. Aorta: The aortic root is normal in size and structure. IAS/Shunts: The interatrial septum was not well visualized.  LEFT VENTRICLE PLAX 2D LVIDd:         2.80 cm   Diastology LVIDs:         1.80 cm   LV  e' medial:    6.37 cm/s LV PW:         1.30 cm   LV E/e' medial:  18.1 LV IVS:        1.60 cm   LV e' lateral:   9.32 cm/s LVOT diam:     1.90 cm   LV E/e' lateral: 12.3 LV SV:         56 LV SV Index:   29 LVOT Area:     2.84 cm  RIGHT VENTRICLE RV S prime:     12.60 cm/s TAPSE (M-mode): 1.3 cm LEFT ATRIUM             Index        RIGHT ATRIUM           Index LA diam:        4.00 cm 2.06 cm/m   RA Area:     12.50 cm LA Vol (A2C):   33.8 ml 17.44 ml/m  RA Volume:   26.20 ml  13.52 ml/m LA Vol (A4C):   67.4 ml 34.78 ml/m LA Biplane Vol: 52.4 ml 27.04 ml/m  AORTIC VALVE LVOT Vmax:   120.00 cm/s LVOT Vmean:  80.300 cm/s LVOT VTI:    0.197 m  AORTA Ao Root diam: 3.30 cm MITRAL VALVE MV Area (PHT): 4.08 cm     SHUNTS MV Area VTI:   1.85 cm     Systemic VTI:  0.20 m MV Peak grad:  16.0 mmHg    Systemic Diam: 1.90 cm MV Mean grad:  7.0 mmHg MV Vmax:       2.00 m/s MV Vmean:  126.0 cm/s MV Decel Time: 186 msec MV E velocity: 115.00 cm/s MV A velocity: 184.00 cm/s MV E/A ratio:  0.62 Mihai Croitoru MD Electronically signed by Sanda Klein MD Signature Date/Time: 11/15/2021/4:11:07 PM    Final    Portable Chest x-ray  Result Date: 11/15/2021 CLINICAL DATA:  Endotracheal tube present. Abdominal pain. Tube placement. EXAM: PORTABLE CHEST 1 VIEW COMPARISON:  Chest two views 10/15/2019 FINDINGS: New endotracheal tube tip appears to terminate within the proximal right mainstem bronchus, approximately 9 mm distal to the carina. Recommend retraction. New enteric tube courses over the esophagus with the side port and tip overlying left upper abdominal quadrant. A temporary pacer overlies the mid heart and upper abdomen. Numerous additional likely EKG leads overlie the thorax. Cardiac silhouette is at the upper limits of normal size. Moderate calcification within the aortic arch. Unchanged mildly decreased lung volumes with mild elevation of the right hemidiaphragm. Mild right-greater-than-left horizontal linear basilar  subsegmental atelectasis is similar to prior. Mild dextrocurvature of the midthoracic spine with moderate multilevel degenerative disc and endplate changes. IMPRESSION: 1. New endotracheal tube tip appears to terminate overlying the proximal mainstem bronchus. Consider retraction approximately 2-3 cm. 2. New likely enteric tube appears in appropriate position. Critical Value/emergent results were called by telephone at the time of interpretation on 11/15/2021 at 2:56 pm to provider Georgann Housekeeper, NP, who verbally acknowledged these results. Electronically Signed   By: Yvonne Kendall M.D.   On: 11/15/2021 14:56   DG Abd 1 View  Result Date: 11/15/2021 CLINICAL DATA:  Abdominal pain.  Assess tube placement. EXAM: ABDOMEN - 1 VIEW COMPARISON:  06/10/2015 FINDINGS: Nasogastric tube courses into the region of the stomach and off the film as tip is not visualized. Nasogastric tube side port is over the region of the stomach in the left upper quadrant. Bowel gas pattern is nonobstructive. Fusion hardware present over the lumbosacral region. There are degenerative changes of the spine and hips. Surgical clips over the pelvis. IMPRESSION: 1. Nonobstructive bowel gas pattern. 2. Nasogastric tube with side port over the stomach in the left upper quadrant. Electronically Signed   By: Marin Olp M.D.   On: 11/15/2021 14:41    Microbiology: Results for orders placed or performed during the hospital encounter of 11/15/21  MRSA Next Gen by PCR, Nasal     Status: None   Collection Time: 11/15/21  4:46 PM   Specimen: Nasal Mucosa; Nasal Swab  Result Value Ref Range Status   MRSA by PCR Next Gen NOT DETECTED NOT DETECTED Final    Comment: (NOTE) The GeneXpert MRSA Assay (FDA approved for NASAL specimens only), is one component of a comprehensive MRSA colonization surveillance program. It is not intended to diagnose MRSA infection nor to guide or monitor treatment for MRSA infections. Test performance is not FDA  approved in patients less than 96 years old. Performed at [facility], [place] [address], [place], [place] [address]   Culture, Respiratory w Gram Stain     Status: None   Collection Time: 11/16/21  8:08 AM   Specimen: Tracheal Aspirate; Respiratory  Result Value Ref Range Status   Specimen Description TRACHEAL ASPIRATE  Final   Special Requests NONE  Final   Gram Stain   Final    ABUNDANT WBC PRESENT, PREDOMINANTLY PMN ABUNDANT GRAM NEGATIVE RODS ABUNDANT GRAM POSITIVE COCCI IN PAIRS    Culture   Final    MODERATE Normal respiratory flora-no Staph aureus or Pseudomonas seen Performed at [facility]  [facility], [place] [address] [place], [place] [address]    Report Status 11/18/2021 FINAL  Final    Labs: CBC: Recent Labs  Lab 11/15/21 1314 11/15/21 1451 11/16/21 0303 11/17/21 0736 11/18/21 0853  WBC 9.0  --  26.1* 13.6* 10.0  HGB 12.4* 16.3 14.6 12.3* 10.8*  HCT 36.7* 48.0 42.4 35.9* 33.2*  MCV 92.9  --  89.5 90.9 94.1  PLT 185  --  254 153 438*   Basic Metabolic Panel: Recent Labs  Lab 11/15/21 1308 11/15/21 1314 11/15/21 1315 11/15/21 1451 11/16/21 0303 11/17/21 0736 11/18/21 0853  NA 140 138  --  132* 128* 132* 134*  K 2.7* 2.8*  --  3.4* 5.0 5.2* 3.5  CL 103 106  --   --  93* 93* 96*  CO2  --  18*  --   --  19* 27 25  GLUCOSE 172* 179*  --   --  246* 139* 129*  BUN 31* 35*  --   --  50* 34* 49*  CREATININE 8.00* 7.88*  --   --  10.52* 7.80* 9.36*  CALCIUM  --  6.7*  --   --  9.4 8.8* 7.9*  MG  --  1.3*  --   --  2.3 1.9  --   PHOS  --  3.6 2.7  --  2.0*  --  5.1*   Liver Function Tests: Recent Labs  Lab 11/15/21 1314 11/18/21 0853  AST 13*  --   ALT 9  --   ALKPHOS 64  --   BILITOT 0.5  --   PROT 4.4*  --   ALBUMIN 2.3* 2.8*   CBG: Recent Labs  Lab 11/17/21 1754 11/17/21 2040 11/18/21 0007 11/18/21 0738 11/18/21 1429  GLUCAP 90 178* 148* 103* 112*    Discharge time spent: less than 30 minutes.  Signed: Marylu Lund, MD [facility] 11/18/2021

## 2021-11-18 NOTE — Procedures (Signed)
HD Note  Patient information is documented for the time the data is gathered but may be entered at a later time.

## 2021-11-18 NOTE — Progress Notes (Signed)
DISCHARGE NOTE HOME Jon Anderson to be discharged Home per MD order. Discussed prescriptions and follow up appointments with the patient. Prescriptions given to patient; medication list explained in detail. Patient verbalized understanding.  Skin clean, dry and intact without evidence of skin break down, no evidence of skin tears noted. IV catheter discontinued intact. Site without signs and symptoms of complications. Dressing and pressure applied. Pt denies pain at the site currently. No complaints noted.  Patient free of lines, drains, and wounds.   An After Visit Summary (AVS) was printed and given to the patient. Patient escorted via wheelchair, and discharged home via private auto.  Jamea Robicheaux S Kharson Rasmusson, RN

## 2021-11-18 NOTE — Progress Notes (Signed)
D/C order noted. Contacted [facility] to advise clinic of pt's d/c today and that pt should resume care Monday.   Melven Sartorius Renal Navigator [phone]

## 2021-11-18 NOTE — TOC Initial Note (Addendum)
Transition of Care [facility]) - Initial/Assessment Note    Patient Details  Name: Jon Anderson MRN: [id number] Date of Birth: Oct 07, 1950  Transition of [facility]) CM/SW Contact:    Bartholomew Crews, RN Phone Number: [phone] 11/18/2021, 12:45 PM  Clinical Narrative:                  Spoke with patient on his cell phone [phone] to discuss post acute transition. Patient was in dialysis during call. Discussed recommendations for [facility] PT - educated about what [place] PT would include. Discussed agency choice. Patient advised that he is followed by [facility] and agreed to [facility] following up with [facility].   Patient confirmed that he has RW at home - reinforced PT recommendations to use until his strength returns.   RNCM called [facility] at [phone]. Spoke with Jonelle Sidle who will pass RNCM contact information and patient needs to specific care team.   Expected Discharge Plan: [place] Barriers to Discharge: Continued Medical Work up   Patient Goals and CMS Choice Patient states their goals for this hospitalization and ongoing recovery are:: return home with his wife CMS Medicare.gov Compare Post Acute Care list provided to:: Patient Choice offered to / list presented to : Patient  Expected Discharge Plan and Services Expected Discharge Plan: [place] In-house Referral: NA Discharge Planning Services: CM Consult Post Acute Care Choice: [place] arrangements for the past 2 months: Single Family Home                                      Prior Living Arrangements/Services Living arrangements for the past 2 months: Single Family Home Lives with:: Spouse, Self Patient language and need for interpreter reviewed:: Yes Do you feel safe going back to the place where you live?: Yes      Need for Family Participation in Patient Care: Yes (Comment) Care giver support system in place?: Yes (comment) Current home services: DME (RW) Criminal  Activity/Legal Involvement Pertinent to Current Situation/Hospitalization: No - Comment as needed  Activities of Daily Living      Permission Sought/Granted Permission sought to share information with : Other (comment) ([facility]) Permission granted to share information with : Yes, Verbal Permission Granted              Emotional Assessment Appearance:: Appears stated age Attitude/Demeanor/Rapport: Engaged Affect (typically observed): Accepting Orientation: : Oriented to Self, Oriented to  Time, Oriented to Place, Oriented to Situation Alcohol / Substance Use: Not Applicable Psych Involvement: No (comment)  Admission diagnosis:  ESRD (end stage renal disease) ([place]) [N18.6] Cardiac arrest [facility]) [I46.9] Patient Active Problem List   Diagnosis Date Noted   Cardiac arrest ([place]) 11/15/2021   Encounter for change or removal of surgical wound dressing 12/10/2019   Acquired absence of other left toe(s) ([place]) 12/10/2019   Type 2 diabetes mellitus with diabetic chronic kidney disease ([place]) 12/03/2019   Right below-knee amputee ([place]) 11/25/2019   S/P transmetatarsal amputation of foot, right ([place]) 11/25/2019   Gangrene of right foot ([place]) 11/19/2019   History of partial ray amputation of fifth toe of right foot ([place]) 09/09/2019   Ischemic ulcer diabetic foot ([place]) 09/09/2019   Subacute osteomyelitis, right ankle and foot (H[facility])    Diabetic foot infection ([place]) 07/24/2019   Chronic diastolic CHF (congestive heart failure) ([place]) 07/24/2019   Diabetic ulcer of right midfoot associated  with diabetes mellitus due to underlying condition, with fat layer exposed ([place])    Gastroenteritis 04/24/2019   Presence of aortocoronary bypass graft 04/11/2019   Long term (current) use of aspirin 04/11/2019   Dependence on renal dialysis ([place]) 04/11/2019   Other constipation 04/11/2019   Allergy, unspecified, initial encounter 01/27/2019   DM neuropathy with neurologic complication ([place]) 63/89/3734    GERD (gastroesophageal reflux disease) 02/28/2018   Nephrolithiasis 02/28/2018   Sciatic leg pain 02/28/2018   Snores 02/28/2018   Orthopnea 02/23/2018   Elevated troponin 02/23/2018   HNP (herniated nucleus pulposus), lumbar 07/03/2017   Encounter for removal of sutures 06/01/2017   Chest pain in adult 04/27/2017   Restless leg syndrome, uncontrolled 04/27/2017   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/[facility] 03/17/2017   Anemia of chronic disease 03/17/2017   ESRD (end stage renal disease) ([place]) 03/17/2017   Chronic chest pain 03/17/2017   Type II diabetes mellitus ([place]) 03/17/2017   Acute on chronic diastolic heart failure ([place]) 03/17/2017   Acute heart failure ([place]) 03/17/2017   Lumbar radiculopathy 01/16/2017   Pre-transplant evaluation for kidney transplant 06/15/2016   Pain, unspecified 04/17/2016   Nocturia 12/17/2015   Chest pain, non-cardiac 10/09/2015   Hypercalcemia 05/10/2015   Other fluid overload 02/24/2015   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[place]) 10/03/2014   Fatty (change of) liver, not elsewhere classified 07/02/2014   Aftercare including intermittent dialysis ([place]) 06/24/2014   Diarrhea, unspecified 06/24/2014   Fever, unspecified 06/24/2014   Iron deficiency anemia, unspecified 06/24/2014   Other specified coagulation defects ([place]) 06/24/2014   Pruritus, unspecified 06/24/2014   Secondary hyperparathyroidism of renal origin ([place]) 06/24/2014   Type 2 diabetes mellitus with diabetic peripheral angiopathy without gangrene ([place]) 06/24/2014   Acute on chronic renal failure ([place]) 06/16/2014   Shoulder pain, left 12/15/2013   Chest pain 12/15/2013   Claudication ([place] ) 12/11/2013   PVD (peripheral vascular disease) ([place]) 12/11/2013   Carotid artery disease ([place]) 09/30/2013   Acute chest pain 11/15/2012   Bruit 09/15/2010   CAD (coronary artery disease) nonobstructive per cath 2012    Hyperlipidemia LDL goal <70 07/22/2009   Essential hypertension  07/22/2009   PCP:  Juanito Doom, MD Pharmacy:   [id number] [place] [place]Phone: [phone]: [phone]    Social Determinants of Health (SDOH) Interventions    Readmission Risk Interventions    11/20/2019    1:27 PM  Readmission Risk Prevention Plan  Transportation Screening Complete  PCP or Specialist Appt within 3-5 Days Complete  HRI or HEdonComplete  Social Work Consult for RLucanPlanning/Counseling Complete  Palliative Care Screening Complete  Medication Review (Press photographer Complete

## 2021-11-18 NOTE — Care Management Important Message (Signed)
Important Message  Patient Details  Name: Jon Anderson MRN: [id number] Date of Birth: [birth year]   Medicare Important Message Given:  Yes     Shelda Altes 11/18/2021, 3:10 PM

## 2021-11-18 NOTE — Progress Notes (Signed)
[facility] Progress Note   Subjective:   Seen on HD. Reports he is feeling much better. Throat is a little sore. Denies SOB, CP, dizziness and nausea.   Objective Vitals:   11/18/21 0838 11/18/21 0900 11/18/21 0930 11/18/21 1000  BP: 129/76 132/80 134/79 133/85  Pulse: 90 90 84 85  Resp: 20 (!) '23 20 20  '$ Temp:      TempSrc:      SpO2: 93% 95% 96% 96%  Weight: 84.3 kg     Height:       Physical Exam General: WDWN alert male in NAD Heart: RRR, no murmurs, rubs or gallops Lungs: CTA bilaterally without wheezing, rhonchi or rales Abdomen: Soft, non-tender, non-distended, +BS Extremities: No edea b/l lower extremities Dialysis Access:  LUE AVG + bruit  Additional Objective Labs: Basic Metabolic Panel: Recent Labs  Lab 11/15/21 1315 11/15/21 1451 11/16/21 0303 11/17/21 0736 11/18/21 0853  NA  --    < > 128* 132* 134*  K  --    < > 5.0 5.2* 3.5  CL  --   --  93* 93* 96*  CO2  --   --  19* 27 25  GLUCOSE  --   --  246* 139* 129*  BUN  --   --  50* 34* 49*  CREATININE  --   --  10.52* 7.80* 9.36*  CALCIUM  --   --  9.4 8.8* 7.9*  PHOS 2.7  --  2.0*  --  5.1*   < > = values in this interval not displayed.   Liver Function Tests: Recent Labs  Lab 11/15/21 1314 11/18/21 0853  AST 13*  --   ALT 9  --   ALKPHOS 64  --   BILITOT 0.5  --   PROT 4.4*  --   ALBUMIN 2.3* 2.8*   No results for input(s): "LIPASE", "AMYLASE" in the last 168 hours. CBC: Recent Labs  Lab 11/15/21 1314 11/15/21 1451 11/16/21 0303 11/17/21 0736 11/18/21 0853  WBC 9.0  --  26.1* 13.6* 10.0  HGB 12.4*   < > 14.6 12.3* 10.8*  HCT 36.7*   < > 42.4 35.9* 33.2*  MCV 92.9  --  89.5 90.9 94.1  PLT 185  --  254 153 142*   < > = values in this interval not displayed.   Blood Culture    Component Value Date/Time   SDES TRACHEAL ASPIRATE 11/16/2021 0808   SPECREQUEST NONE 11/16/2021 0808   CULT  11/16/2021 0808    MODERATE Normal respiratory flora-no Staph aureus or  Pseudomonas seen Performed at [facility], [place] [address] [place], [place] [address]    REPTSTATUS 11/18/2021 FINAL 11/16/2021 2725    Cardiac Enzymes: No results for input(s): "CKTOTAL", "CKMB", "CKMBINDEX", "TROPONINI" in the last 168 hours. CBG: Recent Labs  Lab 11/17/21 1644 11/17/21 1754 11/17/21 2040 11/18/21 0007 11/18/21 0738  GLUCAP 132* 90 178* 148* 103*   Iron Studies: No results for input(s): "IRON", "TIBC", "TRANSFERRIN", "FERRITIN" in the last 72 hours. '@lablastinr3'$ @ Studies/Results: No results found. Medications:  sodium chloride Stopped (11/16/21 2335)   amiodarone      amoxicillin-clavulanate  1 tablet Oral Q24H   aspirin EC  81 mg Oral Daily   calcitRIOL  0.75 mcg Oral Q M,W,F   Chlorhexidine Gluconate Cloth  6 each Topical Q0600   Chlorhexidine Gluconate Cloth  6 each Topical Q0600   clopidogrel  75 mg Oral Daily   docusate sodium  100 mg Oral BID   gabapentin  100 mg Oral Daily   gabapentin  200 mg Oral QHS   heparin  5,000 Units Subcutaneous Q8H   insulin aspart  0-15 Units Subcutaneous TID AC & HS   insulin detemir  5 Units Subcutaneous Daily   midodrine  10 mg Oral BID WC   pantoprazole  40 mg Oral QHS   polyethylene glycol  17 g Oral Daily    Dialysis Orders: MWF [facility]  4h  450/ 1.5  78.5kg  2/2 bath P2  LUA AVG Hep 5000 - last hep B labs: 8/8 - last HD 8/7, post wt was 78.5kg - last Hb 13.8 on 8/2, last pth 377 - calcitriol 0.75 ug tiw po  Assessment/Plan: SP PEA arrest - occurred during fistulogram in IR on 8/8, 3 min to ROSC approximately. Pt recovering well.  Hypotension / shock - post-arrest, resolved. Cont home midodrine 10 bid SP fistulogram - procedure was done to investigate slow flows on HD. Procedure was not completed due to arrest. Access worked well 8/8. ESRD - on HD MWF. Tolerating dialysis well.  Volume - euvolemic on exam, 2-3kg up by wts. UFG 2-3L on next HD.  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 Phos was low, hold phoslo for now.  CAD / hx CABG 2018  Anice Paganini, PA-C 11/18/2021, 10:08 AM  [facility] Pager: [phone]

## 2021-11-20 ENCOUNTER — Telehealth: Payer: Self-pay | Admitting: Physician Assistant

## 2021-11-20 NOTE — Telephone Encounter (Signed)
Transition of care contact from inpatient facility  Date of discharge: 11/18/21 Date of contact: 11/20/21 Method: Phone Spoke to: Patient  Patient contacted to discuss transition of care from recent inpatient hospitalization. Patient was admitted to [facility] with discharge diagnosis of PEA arrest due to contrast anaphylaxis. He reports his chest wall is sore today, worse on the right side. Also reports pain is worse with coughing or laughing. Not associated with SOB, dizziness or palpitations. Pt does not want to go to the ED for eval and pain is atypical, most likely due to compressions. However he was advised to go to the ED if pain persists or becomes pressure-like, SOB, dizziness, palpitations, or nausea occur.  Medication changes were reviewed.  Patient will follow up with his/her outpatient HD unit on: 11/19/21  Anice Paganini, PA-C 11/20/2021, 11:41 AM  [facility]

## 2021-11-21 MED FILL — Medication: Qty: 1 | Status: AC

## 2021-11-26 ENCOUNTER — Encounter (HOSPITAL_COMMUNITY): Payer: Self-pay | Admitting: Emergency Medicine

## 2021-11-26 ENCOUNTER — Emergency Department (HOSPITAL_COMMUNITY): Payer: Medicare Other

## 2021-11-26 ENCOUNTER — Emergency Department (HOSPITAL_COMMUNITY)
Admission: EM | Admit: 2021-11-26 | Discharge: 2021-11-26 | Disposition: A | Discharge status: Home / Self Care | Payer: Medicare Other | Attending: Emergency Medicine | Admitting: Emergency Medicine

## 2021-11-26 DIAGNOSIS — J029 Acute pharyngitis, unspecified: Secondary | ICD-10-CM | POA: Diagnosis present

## 2021-11-26 DIAGNOSIS — U071 COVID-19: Secondary | ICD-10-CM | POA: Insufficient documentation

## 2021-11-26 LAB — COMPREHENSIVE METABOLIC PANEL
ALT: 20 U/L (ref 0–44)
AST: 20 U/L (ref 15–41)
Albumin: 3.7 g/dL (ref 3.5–5.0)
Alkaline Phosphatase: 82 U/L (ref 38–126)
Anion gap: 12 (ref 5–15)
BUN: 21 mg/dL (ref 8–23)
CO2: 30 mmol/L (ref 22–32)
Calcium: 9.1 mg/dL (ref 8.9–10.3)
Chloride: 95 mmol/L — ABNORMAL LOW (ref 98–111)
Creatinine, Ser: 7.87 mg/dL — ABNORMAL HIGH (ref 0.61–1.24)
GFR, Estimated: 7 mL/min — ABNORMAL LOW (ref >60)
Glucose, Bld: 109 mg/dL — ABNORMAL HIGH (ref 70–99)
Potassium: 4 mmol/L (ref 3.5–5.1)
Sodium: 137 mmol/L (ref 135–145)
Total Bilirubin: 0.7 mg/dL (ref 0.3–1.2)
Total Protein: 6.9 g/dL (ref 6.5–8.1)

## 2021-11-26 LAB — CBC WITH DIFFERENTIAL/PLATELET
Abs Immature Granulocytes: 0.02 10*3/uL (ref 0.00–0.07)
Basophils Absolute: 0 10*3/uL (ref 0.0–0.1)
Basophils Relative: 0 %
Eosinophils Absolute: 0.2 10*3/uL (ref 0.0–0.5)
Eosinophils Relative: 3 %
HCT: 39.7 % (ref 39.0–52.0)
Hemoglobin: 12.8 g/dL — ABNORMAL LOW (ref 13.0–17.0)
Immature Granulocytes: 0 %
Lymphocytes Relative: 35 %
Lymphs Abs: 2.6 10*3/uL (ref 0.7–4.0)
MCH: 31 pg (ref 26.0–34.0)
MCHC: 32.2 g/dL (ref 30.0–36.0)
MCV: 96.1 fL (ref 80.0–100.0)
Monocytes Absolute: 1 10*3/uL (ref 0.1–1.0)
Monocytes Relative: 13 %
Neutro Abs: 3.7 10*3/uL (ref 1.7–7.7)
Neutrophils Relative %: 49 %
Platelets: 235 10*3/uL (ref 150–400)
RBC: 4.13 MIL/uL — ABNORMAL LOW (ref 4.22–5.81)
RDW: 14.6 % (ref 11.5–15.5)
WBC: 7.5 10*3/uL (ref 4.0–10.5)
nRBC: 0 % (ref 0.0–0.2)

## 2021-11-26 LAB — LACTIC ACID, PLASMA: Lactic Acid, Venous: 1.2 mmol/L (ref 0.5–1.9)

## 2021-11-26 MED ORDER — BENZONATATE 100 MG PO CAPS: 100.0000 mg | ORAL_CAPSULE | Freq: Three times a day (TID) | ORAL | 0 refills | Status: DC

## 2021-11-26 NOTE — ED Triage Notes (Signed)
Patient here with complaint of a cough and a positive home COVID test. Patient states cough started on 8/10. Patient is afebrile, speaking in complete sentences, and is in no apparent distress at this time. Patient states "I'd really like for you to just give me a shot to make me feel better and send me home"

## 2021-11-26 NOTE — Discharge Instructions (Addendum)
You were seen today for positive COVID test.  You are already y 8 days into your symptoms and it is unlikely that any of the anti-COVID therapies would benefit you.  This is complicated because of your multiple medical conditions requiring ongoing medications in the outpatient setting that make you a poor candidate for Paxlovid. We will prescribe you symptomatic control with Tessalon Perles and recommend you follow-up closely with your PCP.

## 2021-11-26 NOTE — ED Provider Notes (Signed)
[facility] EMERGENCY DEPARTMENT Provider Note   CSN: [id number] Arrival date & time: 11/26/21  0836     History Chief Complaint  Patient presents with   Covid Positive    HPI Jon Anderson is a 71 y.o. male presenting for 8 days of URI symptoms.  Today patient felt moderately worse and his daughter tested him for COVID.  He was known to be COVID-positive.  Notably he has multiple sick members at home. He was recently admitted to the hospital for cardiac arrest and attributed his sore throat to his intubation.  However today on day 8 he felt like he needed to be checked out. He denies any nausea vomiting syncope or shortness of breath.  His main symptom is intermittent coughing.  Patient denies any chest pain at this time and is otherwise ambulatory tolerating p.o. intake..  Patient's recorded medical, surgical, social, medication list and allergies were reviewed in the Snapshot window as part of the initial history.   Review of Systems   Review of Systems  Constitutional:  Negative for chills and fever.  HENT:  Negative for ear pain and sore throat.   Eyes:  Negative for pain and visual disturbance.  Respiratory:  Positive for cough. Negative for shortness of breath.   Cardiovascular:  Negative for chest pain and palpitations.  Gastrointestinal:  Negative for abdominal pain and vomiting.  Genitourinary:  Negative for dysuria and hematuria.  Musculoskeletal:  Negative for arthralgias and back pain.  Skin:  Negative for color change and rash.  Neurological:  Negative for seizures and syncope.  All other systems reviewed and are negative.   Physical Exam Updated Vital Signs BP (!) 102/53 (BP Location: Right Arm)   Pulse 87   Temp 98.2 F (36.8 C) (Oral)   Resp 17   SpO2 94%  Physical Exam Vitals and nursing note reviewed.  Constitutional:      General: He is not in acute distress.    Appearance: He is well-developed.  HENT:     Head:  Normocephalic and atraumatic.  Eyes:     Conjunctiva/sclera: Conjunctivae normal.  Cardiovascular:     Rate and Rhythm: Normal rate and regular rhythm.     Heart sounds: No murmur heard. Pulmonary:     Effort: Pulmonary effort is normal. No respiratory distress.     Breath sounds: Normal breath sounds.  Abdominal:     Palpations: Abdomen is soft.     Tenderness: There is no abdominal tenderness.  Musculoskeletal:        General: No swelling.     Cervical back: Neck supple.  Skin:    General: Skin is warm and dry.     Capillary Refill: Capillary refill takes less than 2 seconds.  Neurological:     Mental Status: He is alert.  Psychiatric:        Mood and Affect: Mood normal.      ED Course/ Medical Decision Making/ A&P    Procedures Procedures   Medications Ordered in ED Medications - No data to display  Medical Decision Making:    Jon Anderson is a 71 y.o. male who presented to the ED today with positive outpatient COVID test and cough detailed above.     Patient's presentation is complicated by their history of multiple comorbid medical problems, recent hospital admission.  Patient placed on continuous vitals and telemetry monitoring while in ED which was reviewed periodically.   Complete initial physical exam performed, notably the patient  was Hemodynamically stable no acute distress.  Lungs are clear to auscultation currently with no focal abnormality appreciated.      Reviewed and confirmed nursing documentation for past medical history, family history, social history.    Initial Assessment:   With the patient's presentation of cough, most likely diagnosis is ongoing viral respiratory infection secondary to COVID infection. Other diagnoses were considered including (but not limited to) pneumonia, pneumothorax, heart failure exacerbation, pulmonary embolism. These are considered less likely due to history of present illness and physical exam findings.   This  is most consistent with an acute life/limb threatening illness complicated by underlying chronic conditions.  Initial Plan:  Screening labs including CBC and Metabolic panel to evaluate for infectious or metabolic etiology of disease.  CXR to evaluate for structural/infectious intrathoracic pathology.  Objective evaluation as below reviewed with plan for close reassessment  Initial Study Results:   Laboratory  All laboratory results reviewed without evidence of clinically relevant pathology.    Radiology  All images reviewed independently. Agree with radiology report at this time.   DG Chest 2 View  Result Date: 11/26/2021 CLINICAL DATA:  Dyspnea. EXAM: CHEST - 2 VIEW COMPARISON:  11/16/2021 FINDINGS: No focal consolidation. No pleural effusion or pneumothorax. Heart and mediastinal contours are unremarkable. Thoracic aortic atherosclerosis. No acute osseous abnormality. Mild thoracic spine spondylosis. IMPRESSION: No active cardiopulmonary disease. Electronically Signed   By: Kathreen Devoid M.D.   On: 11/26/2021 09:29     Final Assessment and Plan:   On repeat assessment, patient remains well-appearing after 2-1/2 hours in the emergency department.  I discussed his ongoing care and management.  He is 8 days into his symptoms and as of such there is no evidence that IV remdesivir infusions would benefit him, IV steroids would benefit him, and he is not a candidate for Paxlovid therapy given his comorbid utilization of anticoagulation and risk for thrombosis secondary to Paxlovid side effects.  I discussed the above form and the risks benefits of each therapy and patient expressed understanding of symptomatic management at this time.  He has no shortness of breath and patient remains stable for outpatient care and management.  Patient discharged with strict quarantine precautions and symptomatic management.  Patient discharged with no further acute events.   Clinical Impression:  1. COVID       Discharge   Final Clinical Impression(s) / ED Diagnoses Final diagnoses:  COVID    Rx / DC Orders ED Discharge Orders          Ordered    benzonatate (TESSALON) 100 MG capsule  Every 8 hours,   Status:  Discontinued        11/26/21 1046    benzonatate (TESSALON) 100 MG capsule  Every 8 hours        11/26/21 1046              Tretha Sciara, MD 11/26/21 1409

## 2021-11-26 NOTE — ED Notes (Signed)
All care rendered by provider

## 2021-12-01 ENCOUNTER — Ambulatory Visit: Payer: Medicare Other | Admitting: Nurse Practitioner

## 2021-12-27 ENCOUNTER — Ambulatory Visit (HOSPITAL_COMMUNITY)
Admission: RE | Admit: 2021-12-27 | Discharge: 2021-12-27 | Disposition: A | Discharge status: Home / Self Care | Payer: Medicare Other | Source: Ambulatory Visit | Attending: Cardiovascular Disease | Admitting: Cardiovascular Disease

## 2021-12-27 ENCOUNTER — Ambulatory Visit (INDEPENDENT_AMBULATORY_CARE_PROVIDER_SITE_OTHER): Payer: Medicare Other | Admitting: Nurse Practitioner

## 2021-12-27 ENCOUNTER — Encounter: Payer: Self-pay | Admitting: Nurse Practitioner

## 2021-12-27 VITALS — BP 110/66 | HR 81 | Ht 70.0 in | Wt 176.0 lb

## 2021-12-27 DIAGNOSIS — E119 Type 2 diabetes mellitus without complications: Secondary | ICD-10-CM | POA: Insufficient documentation

## 2021-12-27 DIAGNOSIS — Z8674 Personal history of sudden cardiac arrest: Secondary | ICD-10-CM

## 2021-12-27 DIAGNOSIS — N186 End stage renal disease: Secondary | ICD-10-CM | POA: Diagnosis present

## 2021-12-27 DIAGNOSIS — I10 Essential (primary) hypertension: Secondary | ICD-10-CM | POA: Insufficient documentation

## 2021-12-27 DIAGNOSIS — I6523 Occlusion and stenosis of bilateral carotid arteries: Secondary | ICD-10-CM

## 2021-12-27 DIAGNOSIS — I739 Peripheral vascular disease, unspecified: Secondary | ICD-10-CM

## 2021-12-27 DIAGNOSIS — E785 Hyperlipidemia, unspecified: Secondary | ICD-10-CM | POA: Diagnosis present

## 2021-12-27 DIAGNOSIS — I2510 Atherosclerotic heart disease of native coronary artery without angina pectoris: Secondary | ICD-10-CM | POA: Diagnosis not present

## 2021-12-27 DIAGNOSIS — Z992 Dependence on renal dialysis: Secondary | ICD-10-CM | POA: Insufficient documentation

## 2021-12-27 NOTE — Patient Instructions (Signed)
Medication Instructions:  Your physician recommends that you continue on your current medications as directed. Please refer to the Current Medication list given to you today.  *If you need a refill on your cardiac medications before your next appointment, please call your pharmacy*  Lab Work: NONE ordered at this time of appointment   If you have labs (blood work) drawn today and your tests are completely normal, you will receive your results only by: MyChart Message (if you have MyChart) OR A paper copy in the mail If you have any lab test that is abnormal or we need to change your treatment, we will call you to review the results.  Testing/Procedures: NONE ordered at this time of appointment   Follow-Up: At [facility], you and your health needs are our priority.  As part of our continuing mission to provide you with exceptional heart care, we have created designated Provider Care Teams.  These Care Teams include your primary Cardiologist (physician) and Advanced Practice Providers (APPs -  Physician Assistants and Nurse Practitioners) who all work together to provide you with the care you need, when you need it.  We recommend signing up for the patient portal called "MyChart".  Sign up information is provided on this After Visit Summary.  MyChart is used to connect with patients for Virtual Visits (Telemedicine).  Patients are able to view lab/test results, encounter notes, upcoming appointments, etc.  Non-urgent messages can be sent to your provider as well.   To learn more about what you can do with MyChart, go to [email or web address].    Your next appointment:   6 month(s)  The format for your next appointment:   In Person  Provider:   Jonathan [name], MD     Other Instructions  Important Information About Sugar       

## 2021-12-27 NOTE — Progress Notes (Signed)
Office Visit    Patient Name: Jon Anderson Date of Encounter: 12/27/2021  Primary Care Provider:  Juanito Doom, MD Primary Cardiologist:  [name], MD  Chief Complaint    71 year old male with a history of CAD s/p CABG x1 (off-pump LIMA-LAD in 2018), PAD s/p peripheral interventions and R TMA, hypertension, hyperlipidemia, carotid artery disease, ESRD on HD, type 2 diabetes, OSA and GERD who presents for follow-up related to CAD.  Past Medical History    Past Medical History:  Diagnosis Date   Anemia of chronic disease    Arthritis    CAD (coronary artery disease)    a.  Myoview 4/11: EF 53%, no scar or ischemia   c. MV 2012 Nl perfusion, apical thinning.  No ischemia or scar.  EF 49%, appears greater by visual estimate.;  d.  Dob stress echo 12/13:  Negative Dob stress echo. There is no evidence of ischemia.  The LVF is normal. b. Normal cors 2016.   Carotid stenosis    a. <68% RICA, >12% LICA by duplex 10/5168   Chronic chest pain    occ   Constipation    chronic   Dyspnea    occasional with extertion   ESRD (end stage renal disease) on dialysis ([place])    M-W-F- [name]   GERD (gastroesophageal reflux disease)    History of hiatal hernia    History of kidney stones    Passed   HNP (herniated nucleus pulposus), lumbar    HTN (hypertension)    echo 3/10: EF 60%, LAE   Hyperlipidemia    Nephrolithiasis    "passed them all"   Peripheral arterial disease ([place])    a. s/p PTCA  right    Pneumonia yrs ago   Restless legs    Sciatic leg pain    Sleep apnea    does not use cpap   Snores    a. presumed OSA, pt has refused sleep eval in past.   Type II diabetes mellitus ([place])    no longer on medications, checks blood glucose at home   Urinary frequency    Uses wheelchair    Walker as ambulation aid    Past Surgical History:  Procedure Laterality Date   A/V FISTULAGRAM Left 01/10/2021   Procedure: A/V FISTULAGRAM;  Surgeon: Waynetta Sandy,  MD;  Location: [place] [facility];  Service: Cardiovascular;  Laterality: Left;   A/V FISTULAGRAM Left 05/19/2021   Procedure: A/V Fistulagram;  Surgeon: Marty Heck, MD;  Location: [place] [facility];  Service: Cardiovascular;  Laterality: Left;   ABDOMINAL AORTOGRAM W/LOWER EXTREMITY Bilateral 08/21/2019   Procedure: ABDOMINAL AORTOGRAM W/LOWER EXTREMITY;  Surgeon: Waynetta Sandy, MD;  Location: [place] [facility];  Service: Cardiovascular;  Laterality: Bilateral;   AMPUTATION Right 07/25/2019   Procedure: RIGHT 5th RAY AMPUTATION;  Surgeon: Newt Minion, MD;  Location: [place];  Service: Orthopedics;  Laterality: Right;   AMPUTATION Right 11/19/2019   Procedure: RIGHT TRANSMETATARSAL AMPUTATION;  Surgeon: Newt Minion, MD;  Location: [place];  Service: Orthopedics;  Laterality: Right;   ANGIOPLASTY / STENTING FEMORAL Left 12/11/2013   dr berry   AV FISTULA PLACEMENT Left 03/19/2014   Procedure: CREATION OF ARTERIOVENOUS (AV) FISTULA  LEFT UPPER ARM;  Surgeon: Mal Misty, MD;  Location: [place];  Service: Vascular;  Laterality: Left;   AV FISTULA PLACEMENT Left 11/09/2020   Procedure: LEFT ARM ARTERIOVENOUS (AV) FISTULA CREATION;  Surgeon: [name]  Harrell Gave, MD;  Location: [place];  Service: Vascular;  Laterality: Left;   AV FISTULA PLACEMENT Left 02/15/2021   Procedure: INSERTION OF LEFT ARM ARTERIOVENOUS GORE-TEX GRAFT;  Surgeon: Waynetta Sandy, MD;  Location: [place];  Service: Vascular;  Laterality: Left;   BACK SURGERY  01/2018   screws placed    [place] Left 12/14/2020   Procedure: REVISION OF LEFT ARM SECOND STAGE BASILIC VEIN TRANSPOSITION;  Surgeon: Waynetta Sandy, MD;  Location: [place];  Service: Vascular;  Laterality: Left;   CARDIAC CATHETERIZATION  2001 and 2010    COLONOSCOPY W/ BIOPSIES AND POLYPECTOMY     COLONOSCOPY WITH PROPOFOL N/A 08/01/2016   Procedure: COLONOSCOPY WITH PROPOFOL;  Surgeon: Carol Ada, MD;   Location: [facility];  Service: Endoscopy;  Laterality: N/A;   CORONARY ARTERY BYPASS GRAFT  2019   [facility] x [facility] bypass   ESOPHAGOGASTRODUODENOSCOPY (EGD) WITH PROPOFOL N/A 08/01/2016   Procedure: ESOPHAGOGASTRODUODENOSCOPY (EGD) WITH PROPOFOL;  Surgeon: Carol Ada, MD;  Location: [facility];  Service: Endoscopy;  Laterality: N/A;   FISTULA SUPERFICIALIZATION Left 02/10/2020   Procedure: 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;  Surgeon: Rosetta Posner, MD;  Location: [place];  Service: Vascular;  Laterality: Left;   FISTULA SUPERFICIALIZATION Left 03/23/2020   Procedure: LEFT UPPER EXTREMITY ARTERIOVENOUS FISTULA PLICATION;  Surgeon: Rosetta Posner, MD;  Location: [place];  Service: Vascular;  Laterality: Left;   FOOT FRACTURE SURGERY Right    ligament repair   FRACTURE SURGERY     left forearm   GRAFT APPLICATION Right 04/15/1094   Procedure: FAT GRAFT APPLICATION;  Surgeon: Evelina Bucy, DPM;  Location: [place];  Service: Podiatry;  Laterality: Right;   HEMATOMA EVACUATION Left 11/09/2020   Procedure: EVACUATION HEMATOMA LEFT ARM;  Surgeon: Angelia Mould, MD;  Location: [facility];  Service: Vascular;  Laterality: Left;   INGUINAL HERNIA REPAIR Left    IR AV DIALY SHUNT INTRO NEEDLE/INTRACATH INITIAL W/PTA/IMG LEFT  11/15/2021   IR REMOVAL TUN CV CATH W/O FL  06/16/2021   IR US GUIDE VASC ACCESS LEFT  11/15/2021   IR US GUIDE VASC ACCESS LEFT  11/15/2021   IR US GUIDE VASC ACCESS RIGHT  11/15/2021   LEFT HEART CATH AND CORS/GRAFTS ANGIOGRAPHY N/A 04/27/2017   Procedure: LEFT HEART CATH AND CORS/GRAFTS ANGIOGRAPHY;  Surgeon: Leonie Man, MD;  Location: [place] [facility];  Service: Cardiovascular;  Laterality: N/A;   LEFT HEART CATHETERIZATION WITH CORONARY ANGIOGRAM N/A 06/22/2014   Procedure: LEFT HEART CATHETERIZATION WITH CORONARY ANGIOGRAM;  Surgeon: Troy Sine, MD;  Location: [facility] C[facility];  Service: Cardiovascular;  Laterality: N/A;   LOWER EXTREMITY  ANGIOGRAM Left 12/11/2013   Procedure: LOWER EXTREMITY ANGIOGRAM;  Surgeon: Lorretta Harp, MD;  Location: [facility] CATH LAB;  Service: Cardiovascular;  Laterality: Left;   LUMBAR LAMINECTOMY/DECOMPRESSION MICRODISCECTOMY Right 07/03/2017   Procedure: MICRODISCECTOMY LUMBAR FIVE - SACRAL ONE RIGHT;  Surgeon: Consuella Lose, MD;  Location: [facility];  Service: Neurosurgery;  Laterality: Right;   LUMBAR LAMINECTOMY/DECOMPRESSION MICRODISCECTOMY Right 10/19/2017   Procedure: MICRODISCECTOMY LUMBAR FIVE- SACRAL 1 ONE ;  Surgeon: Consuella Lose, MD;  Location: [place];  Service: Neurosurgery;  Laterality: Right;   PERIPHERAL VASCULAR BALLOON ANGIOPLASTY Left 05/19/2021   Procedure: PERIPHERAL VASCULAR BALLOON ANGIOPLASTY;  Surgeon: Marty Heck, MD;  Location: [place] [facility];  Service: Cardiovascular;  Laterality: Left;   PERIPHERAL VASCULAR INTERVENTION Left 01/10/2021   Procedure: PERIPHERAL VASCULAR INTERVENTION;  Surgeon: Waynetta Sandy, MD;  Location: [place] [facility];  Service: Cardiovascular;  Laterality: Left;   THROMBECTOMY W/ EMBOLECTOMY Left 11/09/2020   Procedure: THROMBECTOMY OF LEFT ARTERIOVENOUS FISTULA;  Surgeon: Angelia Mould, MD;  Location: [place];  Service: Vascular;  Laterality: Left;   TONSILLECTOMY AND ADENOIDECTOMY     WISDOM TOOTH EXTRACTION     WOUND DEBRIDEMENT Right 05/13/2019   Procedure: [place];  Surgeon: Evelina Bucy, DPM;  Location: [place];  Service: Podiatry;  Laterality: Right;    Allergies  Allergies  Allergen Reactions   Contrast Media [Iodinated Contrast Media] Anaphylaxis    Cardiac arrest, Throat swelling, hives, SOB   Kiwi Extract Itching, Swelling and Other (See Comments)    Lips and face swell- breathing not affected   Flexeril [Cyclobenzaprine]     Hands become flimsy, can not hold things   Tape Other (See Comments)    "Plastic" tape causes blisters!!    History of Present Illness     71 year old male with the above past medical history including CAD s/p CABG x1 (off-pump LIMA-LAD in 2018), PAD s/p peripheral interventions and R TMA, hypertension, hyperlipidemia, carotid artery disease, ESRD on HD, type 2 diabetes, OSA and GERD.  He has followed with both Dr. Gwenlyn Found and [facility]. He has a history of ESRD on HD. A work-up for renal transplant in 2018 revealed severe 75 stenosis of the LAD and he underwent off-pump CABG with LIMA-LAD at [facility].  Cardiac catheterization in 2019 in the setting of recurrent chest pain demonstrated patent LIMA-LAD, otherwise noncritical coronary artery disease, normal LV function.  He was last seen in the office on 08/03/2020 and was stable from a cardiac standpoint.  He did note symptoms of claudication with PAD s/p prior left SFA intervention.  He has a history of critical limb ischemia s/p right transmetatarsal amputation and subsequent stenting of his right popliteal artery and angioplasty of his posterior tibial artery, followed by vascular surgery.  Additionally, he has a history of carotid artery disease.  Carotid dopplers in 05/2020 showed 1 to 39% R ICA stenosis, 60 to 25% LICA stenosis.  He was hospitalized in 11/2021 in the setting of elective fistulogram squint PEA arrest in the setting of contrast dye anaphylaxis. ROSC was obtained following one round of CPR.  Additionally, he was noted to have hypokalemia and hypomagnesemia. Echocardiogram showed EF greater than 75%, no RWMA, G1 DD, normal RV function, mild LAE, and mild mitral valve stenosis. He was discharged home in stable condition. He was evaluated in the ED 1 week later in the setting of COVID-19 infection. Symptom management was advised and he was discharged home in stable condition.  He presents today for follow-up.  Since his hospitalization and since his last visit he has been stable from a cardiac standpoint.  He does note some mild dyspnea since having been diagnosed with COVID-19.  He  also notes ongoing soreness in his chest where he had compressions during CPR, this is worse with coughing.  Otherwise, he reports feeling well denies any additional concerns today.  Home Medications    Current Outpatient Medications  Medication Sig Dispense Refill   acetaminophen (TYLENOL) 500 MG tablet Take 1,000 mg by mouth every 6 (six) hours as needed for moderate pain.     aspirin EC 81 MG tablet Take 1 tablet (81 mg total) by mouth daily.     atorvastatin (LIPITOR) 40 MG tablet Take 1 tablet (40 mg total) by  mouth daily at 6 PM. 30 tablet 5   benzonatate (TESSALON) 100 MG capsule Take 1 capsule (100 mg total) by mouth every 8 (eight) hours. 21 capsule 0   CALCITRIOL PO Take 1 tablet by mouth every Monday, Wednesday, and Friday.     calcium acetate (PHOSLO) 667 MG capsule Take 2 capsules (1,334 mg total) by mouth 2 (two) times daily with a meal. 180 capsule 0   clopidogrel (PLAVIX) 75 MG tablet Take 75 mg by mouth daily.     Etelcalcetide HCl (PARSABIV IV) Etelcalcetide (Parsabiv)     gabapentin (NEURONTIN) 100 MG capsule Take 100-200 mg by mouth See admin instructions. 100 mg in the morning, 200 mg in the evening     loperamide (IMODIUM) 2 MG capsule Take 2 mg by mouth as needed for diarrhea or loose stools.     midodrine (PROAMATINE) 10 MG tablet Take 10 mg by mouth in the morning and at bedtime.     multivitamin (RENA-VIT) TABS tablet Take 1 tablet by mouth at bedtime. 30 tablet 0   ONETOUCH ULTRA test strip See admin instructions.     pantoprazole (PROTONIX) 40 MG tablet Take 40 mg by mouth daily.     predniSONE (DELTASONE) 50 MG tablet Take one tablet at 13 hours, 7 hours and 1 hour prior to your procedure. 3 tablet 0   senna-docusate (SENOKOT-S) 8.6-50 MG tablet Take 2 tablets by mouth 2 (two) times daily as needed for mild constipation.     No current facility-administered medications for this visit.     Review of Systems    He denies chest pain, palpitations, dyspnea, pnd,  orthopnea, n, v, dizziness, syncope, edema, weight gain, or early satiety. All other systems reviewed and are otherwise negative except as noted above.   Physical Exam    VS:  BP 110/66 (BP Location: Right Arm, Patient Position: Sitting, Cuff Size: Normal)   Pulse 81   Ht '5\' 10"'$  (1.778 m)   Wt 176 lb (79.8 kg)   BMI 25.25 kg/m  GEN: Well nourished, well developed, in no acute distress. HEENT: normal. Neck: Supple, no JVD, L carotid bruit, no masses. Cardiac: RRR, no murmurs, rubs, or gallops. No clubbing, cyanosis, edema.  Radials/DP/PT 1+ and equal bilaterally.  Respiratory:  Respirations regular and unlabored, clear to auscultation bilaterally. GI: Soft, nontender, nondistended, BS + x 4. MS: no deformity or atrophy. Skin: warm and dry, no rash. Neuro:  Strength and sensation are intact. Psych: Normal affect.  Accessory Clinical Findings    ECG personally reviewed by me today - No EKG in office today.   Lab Results  Component Value Date   WBC 7.5 11/26/2021   HGB 12.8 (L) 11/26/2021   HCT 39.7 11/26/2021   MCV 96.1 11/26/2021   PLT 235 11/26/2021   Lab Results  Component Value Date   CREATININE 7.87 (H) 11/26/2021   BUN 21 11/26/2021   NA 137 11/26/2021   K 4.0 11/26/2021   CL 95 (L) 11/26/2021   CO2 30 11/26/2021   Lab Results  Component Value Date   ALT 20 11/26/2021   AST 20 11/26/2021   ALKPHOS 82 11/26/2021   BILITOT 0.7 11/26/2021   Lab Results  Component Value Date   CHOL 130 07/18/2018   HDL 47 07/18/2018   LDLCALC 68 07/18/2018   TRIG 87 11/16/2021   CHOLHDL 2.8 07/18/2018    Lab Results  Component Value Date   HGBA1C 7.6 (H) 11/15/2021    Assessment &  Plan    1. CAD: S/p off pump LIMA-LAD at [facility] in 2018. Cardiac catheterization in 2019 in the setting of recurrent chest pain demonstrated patent LIMA-LAD, otherwise noncritical coronary artery disease, normal LV function.  He does note some recent dyspnea in the setting of COVID-19 infection.   Otherwise, stable with no anginal symptoms. No indication for ischemic evaluation.  Continue aspirin, Plavix, Lipitor.  2. H/o cardiac arrest: Occurred in the setting of anaphylaxis r/t contrast allergy despite premedication.  He does have some residual soreness in his chest from compressions.  Otherwise, he is stable, denies any symptoms concerning for angina.  3. PAD: S/p prior left SFA intervention. Denies claudication.  Only with vascular surgery and Dr. Gwenlyn Found. Continue medications as above.   4. Hypertension: BP well controlled. Continue current antihypertensive regimen.   5. Hyperlipidemia: No recent LDL on file.  Monitored and managed per PCP, continue aspirin, Lipitor.  6. Carotid artery disease:  Carotid dopplers in 05/2020 showed 1 to 39% R ICA stenosis, 60 to 9% LICA stenosis.  Repeat carotid ultrasound pending today.   7. ESRD: On HD MWF. No concerns.   8. Type 2 diabetes: A1C was 7.6 in 11/2021. Monitored and managed per PCP.   9. Disposition: Follow-up in 6 months.       Lenna Sciara, NP 12/27/2021, 8:26 AM

## 2022-01-10 ENCOUNTER — Encounter (HOSPITAL_COMMUNITY): Payer: Self-pay | Admitting: Gastroenterology

## 2022-01-10 NOTE — Progress Notes (Signed)
Attempted to obtain medical history via telephone, unable to reach at this time. HIPAA compliant voicemail message left requesting return call to pre surgical testing department. 

## 2022-01-13 ENCOUNTER — Telehealth: Payer: Self-pay | Admitting: *Deleted

## 2022-01-13 NOTE — Telephone Encounter (Signed)
     Primary Cardiologist: [name], MD  Chart reviewed as part of pre-operative protocol coverage. Given past medical history and time since last visit, based on ACC/AHA guidelines, Jon Anderson would be at acceptable risk for the planned procedure without further cardiovascular testing.   His Plavix may be held for 5 days prior to his procedure.  Please resume as soon as hemostasis is achieved.  I will route this recommendation to the requesting party via Epic fax function and remove from pre-op pool.  Please call with questions.  Jossie Ng. Algie Cales NP-C     01/13/2022, 8:51 AM [place] [address] Office [phone] Fax [phone]

## 2022-01-13 NOTE — Progress Notes (Signed)
Jon Anderson Bowel Prep reminder: will follow prep instructions  For Anesthesia: PCP - [name] Cardiologist - [name]  Chest x-ray -11/26/21 EKG - 11/15/21 Stress Test -  n/a ECHO - 11/15/21 Cardiac Cath -  05/19/21 Pacemaker/ICD device last checked: n/a Sleep Study - 2018 CPAP -  n/a   Blood Thinner Instructions: Plavix Instructions:  hold 5 days Last Dose: 10/5     Anesthesia review: Hx CAD, HTN, ESRD, CABG 2019, recent hospitalization 8/8-/8/11 for elective outpatient revision of left fistulogram  and coded due to allergic reaction to contrast. Was seen by cards 12/27/21 for f/u

## 2022-01-13 NOTE — Telephone Encounter (Signed)
   Pre-operative Risk Assessment    Patient Name: Jon Anderson  DOB: [birth year] MRN: [id number]      Request for Surgical Clearance    Procedure:   COLONOSCOPY  Date of Surgery:  Clearance 01/17/22                                 Surgeon:  DR. PATRICK HUNG Surgeon's Group or Practice Name:  [facility]  Phone number:  [phone] Fax number:  [phone]   Type of Clearance Requested:   - Medical  - Pharmacy:  Hold Clopidogrel (Plavix)     Type of Anesthesia:   PROPOFOL   Additional requests/questions:    Jiles Prows   01/13/2022, 8:33 AM

## 2022-01-17 ENCOUNTER — Ambulatory Visit (HOSPITAL_COMMUNITY)
Admission: RE | Admit: 2022-01-17 | Discharge: 2022-01-17 | Disposition: A | Discharge status: Home / Self Care | Payer: Medicare Other | Attending: Gastroenterology | Admitting: Gastroenterology

## 2022-01-17 ENCOUNTER — Ambulatory Visit (HOSPITAL_COMMUNITY): Payer: Medicare Other | Admitting: Physician Assistant

## 2022-01-17 ENCOUNTER — Ambulatory Visit (HOSPITAL_BASED_OUTPATIENT_CLINIC_OR_DEPARTMENT_OTHER): Payer: Medicare Other | Admitting: Physician Assistant

## 2022-01-17 ENCOUNTER — Other Ambulatory Visit: Payer: Self-pay

## 2022-01-17 ENCOUNTER — Encounter (HOSPITAL_COMMUNITY)
Admission: RE | Disposition: A | Discharge status: Home / Self Care | Payer: Self-pay | Source: Home / Self Care | Attending: Gastroenterology

## 2022-01-17 DIAGNOSIS — K5730 Diverticulosis of large intestine without perforation or abscess without bleeding: Secondary | ICD-10-CM | POA: Insufficient documentation

## 2022-01-17 DIAGNOSIS — Z992 Dependence on renal dialysis: Secondary | ICD-10-CM | POA: Diagnosis not present

## 2022-01-17 DIAGNOSIS — Z87891 Personal history of nicotine dependence: Secondary | ICD-10-CM | POA: Diagnosis not present

## 2022-01-17 DIAGNOSIS — D120 Benign neoplasm of cecum: Secondary | ICD-10-CM | POA: Diagnosis not present

## 2022-01-17 DIAGNOSIS — D123 Benign neoplasm of transverse colon: Secondary | ICD-10-CM | POA: Insufficient documentation

## 2022-01-17 DIAGNOSIS — Z7902 Long term (current) use of antithrombotics/antiplatelets: Secondary | ICD-10-CM | POA: Diagnosis not present

## 2022-01-17 DIAGNOSIS — K635 Polyp of colon: Secondary | ICD-10-CM

## 2022-01-17 DIAGNOSIS — Z86010 Personal history of colonic polyps: Secondary | ICD-10-CM | POA: Diagnosis not present

## 2022-01-17 DIAGNOSIS — Z1211 Encounter for screening for malignant neoplasm of colon: Secondary | ICD-10-CM | POA: Insufficient documentation

## 2022-01-17 DIAGNOSIS — K219 Gastro-esophageal reflux disease without esophagitis: Secondary | ICD-10-CM | POA: Diagnosis not present

## 2022-01-17 DIAGNOSIS — I130 Hypertensive heart and chronic kidney disease with heart failure and stage 1 through stage 4 chronic kidney disease, or unspecified chronic kidney disease: Secondary | ICD-10-CM | POA: Insufficient documentation

## 2022-01-17 DIAGNOSIS — E1151 Type 2 diabetes mellitus with diabetic peripheral angiopathy without gangrene: Secondary | ICD-10-CM | POA: Diagnosis not present

## 2022-01-17 DIAGNOSIS — K449 Diaphragmatic hernia without obstruction or gangrene: Secondary | ICD-10-CM | POA: Insufficient documentation

## 2022-01-17 DIAGNOSIS — I5030 Unspecified diastolic (congestive) heart failure: Secondary | ICD-10-CM | POA: Insufficient documentation

## 2022-01-17 DIAGNOSIS — N186 End stage renal disease: Secondary | ICD-10-CM | POA: Insufficient documentation

## 2022-01-17 DIAGNOSIS — E1122 Type 2 diabetes mellitus with diabetic chronic kidney disease: Secondary | ICD-10-CM | POA: Insufficient documentation

## 2022-01-17 DIAGNOSIS — I2510 Atherosclerotic heart disease of native coronary artery without angina pectoris: Secondary | ICD-10-CM | POA: Diagnosis not present

## 2022-01-17 DIAGNOSIS — Z951 Presence of aortocoronary bypass graft: Secondary | ICD-10-CM | POA: Diagnosis not present

## 2022-01-17 DIAGNOSIS — D124 Benign neoplasm of descending colon: Secondary | ICD-10-CM | POA: Insufficient documentation

## 2022-01-17 HISTORY — PX: POLYPECTOMY: SHX5525

## 2022-01-17 HISTORY — PX: COLONOSCOPY WITH PROPOFOL: SHX5780

## 2022-01-17 LAB — POCT I-STAT, CHEM 8
BUN: 35 mg/dL — ABNORMAL HIGH (ref 8–23)
Calcium, Ion: 1.05 mmol/L — ABNORMAL LOW (ref 1.15–1.40)
Chloride: 95 mmol/L — ABNORMAL LOW (ref 98–111)
Creatinine, Ser: 9.8 mg/dL — ABNORMAL HIGH (ref 0.61–1.24)
Glucose, Bld: 111 mg/dL — ABNORMAL HIGH (ref 70–99)
HCT: 44 % (ref 39.0–52.0)
Hemoglobin: 15 g/dL (ref 13.0–17.0)
Potassium: 3.9 mmol/L (ref 3.5–5.1)
Sodium: 137 mmol/L (ref 135–145)
TCO2: 33 mmol/L — ABNORMAL HIGH (ref 22–32)

## 2022-01-17 SURGERY — COLONOSCOPY WITH PROPOFOL
Anesthesia: Monitor Anesthesia Care

## 2022-01-17 MED ORDER — LIDOCAINE 2% (20 MG/ML) 5 ML SYRINGE
INTRAMUSCULAR | Status: DC | PRN
  Administered 2022-01-17: 50 mg via INTRAVENOUS

## 2022-01-17 MED ORDER — SODIUM CHLORIDE 0.9 % IV SOLN
INTRAVENOUS | Status: DC
  Administered 2022-01-17: 500 mL via INTRAVENOUS

## 2022-01-17 MED ORDER — PROPOFOL 10 MG/ML IV BOLUS
INTRAVENOUS | Status: AC
  Filled 2022-01-17: qty 20

## 2022-01-17 MED ORDER — FENTANYL CITRATE (PF) 100 MCG/2ML IJ SOLN
INTRAMUSCULAR | Status: AC
  Filled 2022-01-17: qty 2

## 2022-01-17 MED ORDER — PROPOFOL 10 MG/ML IV BOLUS
INTRAVENOUS | Status: DC | PRN
  Administered 2022-01-17: 70 mg via INTRAVENOUS

## 2022-01-17 MED ORDER — PROPOFOL 500 MG/50ML IV EMUL
INTRAVENOUS | Status: DC | PRN
  Administered 2022-01-17: 100 ug/kg/min via INTRAVENOUS

## 2022-01-17 MED ORDER — PHENYLEPHRINE HCL (PRESSORS) 10 MG/ML IV SOLN
INTRAVENOUS | Status: DC | PRN
  Administered 2022-01-17 (×2): 80 ug via INTRAVENOUS
  Administered 2022-01-17: 160 ug via INTRAVENOUS

## 2022-01-17 SURGICAL SUPPLY — 22 items

## 2022-01-17 NOTE — Anesthesia Preprocedure Evaluation (Addendum)
Anesthesia Evaluation  Patient identified by MRN, date of birth, ID band Patient awake    Reviewed: Allergy & Precautions, NPO status , Patient's Chart, lab work & pertinent test results  History of Anesthesia Complications Negative for: history of anesthetic complications  Airway Mallampati: I  TM Distance: >3 FB Neck ROM: Full    Dental  (+) Edentulous Upper, Edentulous Lower   Pulmonary shortness of breath, sleep apnea , neg COPD, former smoker,    Pulmonary exam normal        Cardiovascular hypertension, Pt. on medications (-) angina+ CAD, + CABG, + Peripheral Vascular Disease and +CHF  Normal cardiovascular exam(-) dysrhythmias   Left ventricle: The cavity size was normal. There was severe  concentric hypertrophy. Systolic function was normal. The  estimated ejection fraction was in the range of 60% to 65%. Wall  motion was normal; there were no regional wall motion  abnormalities. There was an increased relative contribution of  atrial contraction to ventricular filling. Doppler parameters are  consistent with abnormal left ventricular relaxation (grade 1  diastolic dysfunction). Doppler parameters are consistent with  high ventricular filling pressure.  - Aortic valve: Trileaflet; mildly thickened, mildly calcified  leaflets. Valve area (VTI): 1.99 cm^2. Valve area (Vmax): 2.12  cm^2. Valve area (Vmean): 2.02 cm^2.  - Mitral valve: Severely calcified annulus. Moderate diffuse  thickening and calcification of the anterior leaflet and  posterior leaflet. Mobility of the posterior leaflet was  restricted to the point of immobility. The findings are  consistent with moderate stenosis. There was mild regurgitation.  Mean gradient (D): 7 mm Hg. Valve area by continuity equation  (using LVOT flow): 1.11 cm^2.  - Left atrium: The atrium was mildly dilated.  - Pulmonary arteries: Systolic  pressure could not be accurately  estimated as IVC is not visualized.   ? Prox LAD lesion is 75% stenosed. -Focal lesion that was previously described. ? LIMA-LAD is widely patent and is normal in caliber. There is competitive flow. ? Otherwise minimal disease throughout. Nothing made greater than 40% in the ostial circumflex. ? The left ventricular systolic function is normal. The left ventricular ejection fraction is 50-55% by visual estimate. ? LV end diastolic pressure is low. - ~0-3 mmHg ? There is no aortic valve stenosis.   Angiographically no culprit lesion to explain the patient's symptoms.  He does have a significant LAD lesion which is a very focal lesion and easily stent pull, however there is a widely patent LIMA graft distally.  There is actually retrograde filling from the LIMA graft to the diagonal branch which would be the only branch jeopardized by the more upstream LAD lesion. Nothing to explain the patient's symptoms.  In fact, his LVEDP is very low after dialysis.  This would indicate that he was adequately dialyzed and argue against increased LVEDP causing microvascular ischemic symptoms.   At this point, I think the only choice is to continue with his low-dose beta-blocker and nitrate for what still is probably microvascular disease disease. At least we know that with his ongoing symptoms are not likely related to any flow-limiting microvascular lesions.  Anticipate that he can be discharged tomorrow after his bedrest is over and medications have been reassessed.      Neuro/Psych neg Seizures  Neuromuscular disease negative psych ROS   GI/Hepatic hiatal hernia, GERD  ,  Endo/Other  diabetes  Renal/GU ESRF and DialysisRenal diseaselastHD Mon Lab Results      Component  Value               Date                      CREATININE               9.10 (H)            02/15/2021           Lab Results      Component                Value               Date                       K                        3.9                 02/15/2021                Musculoskeletal  (+) Arthritis ,   Abdominal   Peds  Hematology  (+) Blood dyscrasia, , Lab Results      Component                Value               Date                      WBC                      6.5                 11/09/2020                HGB                      16.3                02/15/2021                HCT                      48.0                02/15/2021                MCV                      98.4                11/09/2020                PLT                      179                 11/09/2020              Anesthesia Other Findings   Reproductive/Obstetrics                            Anesthesia Physical  Anesthesia Plan  ASA: 3  Anesthesia Plan: Spinal   Post-op Pain Management: Minimal or no pain anticipated   Induction:  Intravenous  PONV Risk Score and Plan: 1 and Ondansetron and Dexamethasone  Airway Management Planned: Natural Airway and Simple Face Mask  Additional Equipment: None  Intra-op Plan:   Post-operative Plan:   Informed Consent: I have reviewed the patients History and Physical, chart, labs and discussed the procedure including the risks, benefits and alternatives for the proposed anesthesia with the patient or authorized representative who has indicated his/her understanding and acceptance.     Dental advisory given  Plan Discussed with: Anesthesiologist and CRNA  Anesthesia Plan Comments:        Anesthesia Quick Evaluation

## 2022-01-17 NOTE — H&P (Signed)
Jon Anderson HPI: This is a 71 year old male here for a follow up colonoscopy.  His colonoscopy in 2018 was positive for a couple of cecal adenomas.  Past Medical History:  Diagnosis Date   Anemia of chronic disease    Arthritis    CAD (coronary artery disease)    a.  Myoview 4/11: EF 53%, no scar or ischemia   c. MV 2012 Nl perfusion, apical thinning.  No ischemia or scar.  EF 49%, appears greater by visual estimate.;  d.  Dob stress echo 12/13:  Negative Dob stress echo. There is no evidence of ischemia.  The LVF is normal. b. Normal cors 2016.   Carotid stenosis    a. <37% RICA, >04% LICA by duplex 11/8889   Chronic chest pain    occ   Constipation    chronic   Dyspnea    occasional with extertion   ESRD (end stage renal disease) on dialysis (Jon Anderson)    M-W-F- Jon Anderson   GERD (gastroesophageal reflux disease)    History of hiatal hernia    History of kidney stones    Passed   HNP (herniated nucleus pulposus), lumbar    HTN (hypertension)    echo 3/10: EF 60%, LAE   Hyperlipidemia    Nephrolithiasis    "passed them all"   Peripheral arterial disease (Anderson)    a. s/p PTCA  right    Pneumonia yrs ago   Restless legs    Sciatic leg pain    Sleep apnea    does not use cpap   Snores    a. presumed OSA, pt has refused sleep eval in past.   Type II diabetes mellitus (Jon Anderson)    no longer on medications, checks blood glucose at home   Urinary frequency    Uses wheelchair    Walker as ambulation aid     Past Surgical History:  Procedure Laterality Date   A/V FISTULAGRAM Left 01/10/2021   Procedure: A/V FISTULAGRAM;  Surgeon: Waynetta Sandy, MD;  Location: [place] [facility];  Service: Cardiovascular;  Laterality: Left;   A/V FISTULAGRAM Left 05/19/2021   Procedure: A/V Fistulagram;  Surgeon: Marty Heck, MD;  Location: [place] [facility];  Service: Cardiovascular;  Laterality: Left;   ABDOMINAL AORTOGRAM W/LOWER EXTREMITY Bilateral 08/21/2019   Procedure:  ABDOMINAL AORTOGRAM W/LOWER EXTREMITY;  Surgeon: Waynetta Sandy, MD;  Location: [place] [facility];  Service: Cardiovascular;  Laterality: Bilateral;   AMPUTATION Right 07/25/2019   Procedure: RIGHT 5th RAY AMPUTATION;  Surgeon: Newt Minion, MD;  Location: [place];  Service: Orthopedics;  Laterality: Right;   AMPUTATION Right 11/19/2019   Procedure: RIGHT TRANSMETATARSAL AMPUTATION;  Surgeon: Newt Minion, MD;  Location: [place];  Service: Orthopedics;  Laterality: Right;   ANGIOPLASTY / STENTING FEMORAL Left 12/11/2013   dr berry   AV FISTULA PLACEMENT Left 03/19/2014   Procedure: CREATION OF ARTERIOVENOUS (AV) FISTULA  LEFT UPPER ARM;  Surgeon: Mal Misty, MD;  Location: [place];  Service: Vascular;  Laterality: Left;   AV FISTULA PLACEMENT Left 11/09/2020   Procedure: LEFT ARM ARTERIOVENOUS (AV) FISTULA CREATION;  Surgeon: Waynetta Sandy, MD;  Location: [place];  Service: Vascular;  Laterality: Left;   AV FISTULA PLACEMENT Left 02/15/2021   Procedure: INSERTION OF LEFT ARM ARTERIOVENOUS GORE-TEX GRAFT;  Surgeon: Waynetta Sandy, MD;  Location: [place];  Service: Vascular;  Laterality: Left;   BACK SURGERY  01/2018  screws placed    [place] Left 12/14/2020   Procedure: REVISION OF LEFT ARM SECOND STAGE BASILIC VEIN TRANSPOSITION;  Surgeon: Waynetta Sandy, MD;  Location: [place];  Service: Vascular;  Laterality: Left;   CARDIAC CATHETERIZATION  2001 and 2010    COLONOSCOPY W/ BIOPSIES AND POLYPECTOMY     COLONOSCOPY WITH PROPOFOL N/A 08/01/2016   Procedure: COLONOSCOPY WITH PROPOFOL;  Surgeon: Carol Ada, MD;  Location: [facility];  Service: Endoscopy;  Laterality: N/A;   CORONARY ARTERY BYPASS GRAFT  2019   [facility] x [facility] bypass   ESOPHAGOGASTRODUODENOSCOPY (EGD) WITH PROPOFOL N/A 08/01/2016   Procedure: ESOPHAGOGASTRODUODENOSCOPY (EGD) WITH PROPOFOL;  Surgeon: Carol Ada, MD;  Location: [facility];  Service: Endoscopy;  Laterality:  N/A;   FISTULA SUPERFICIALIZATION Left 02/10/2020   Procedure: 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;  Surgeon: Rosetta Posner, MD;  Location: [place];  Service: Vascular;  Laterality: Left;   FISTULA SUPERFICIALIZATION Left 03/23/2020   Procedure: LEFT UPPER EXTREMITY ARTERIOVENOUS FISTULA PLICATION;  Surgeon: Rosetta Posner, MD;  Location: [place];  Service: Vascular;  Laterality: Left;   FOOT FRACTURE SURGERY Right    ligament repair   FRACTURE SURGERY     left forearm   GRAFT APPLICATION Right 08/11/6501   Procedure: FAT GRAFT APPLICATION;  Surgeon: Evelina Bucy, DPM;  Location: [place];  Service: Podiatry;  Laterality: Right;   HEMATOMA EVACUATION Left 11/09/2020   Procedure: EVACUATION HEMATOMA LEFT ARM;  Surgeon: Angelia Mould, MD;  Location: [facility];  Service: Vascular;  Laterality: Left;   INGUINAL HERNIA REPAIR Left    IR AV DIALY SHUNT INTRO NEEDLE/INTRACATH INITIAL W/PTA/IMG LEFT  11/15/2021   IR REMOVAL TUN CV CATH W/O FL  06/16/2021   IR US GUIDE VASC ACCESS LEFT  11/15/2021   IR US GUIDE VASC ACCESS LEFT  11/15/2021   IR US GUIDE VASC ACCESS RIGHT  11/15/2021   LEFT HEART CATH AND CORS/GRAFTS ANGIOGRAPHY N/A 04/27/2017   Procedure: LEFT HEART CATH AND CORS/GRAFTS ANGIOGRAPHY;  Surgeon: Leonie Man, MD;  Location: [place] [facility];  Service: Cardiovascular;  Laterality: N/A;   LEFT HEART CATHETERIZATION WITH CORONARY ANGIOGRAM N/A 06/22/2014   Procedure: LEFT HEART CATHETERIZATION WITH CORONARY ANGIOGRAM;  Surgeon: Troy Sine, MD;  Location: [facility];  Service: Cardiovascular;  Laterality: N/A;   LOWER EXTREMITY ANGIOGRAM Left 12/11/2013   Procedure: LOWER EXTREMITY ANGIOGRAM;  Surgeon: Lorretta Harp, MD;  Location: [facility];  Service: Cardiovascular;  Laterality: Left;   LUMBAR LAMINECTOMY/DECOMPRESSION MICRODISCECTOMY Right 07/03/2017   Procedure: MICRODISCECTOMY LUMBAR FIVE - SACRAL ONE RIGHT;  Surgeon: Consuella Lose, MD;   Location: [place];  Service: Neurosurgery;  Laterality: Right;   LUMBAR LAMINECTOMY/DECOMPRESSION MICRODISCECTOMY Right 10/19/2017   Procedure: MICRODISCECTOMY LUMBAR FIVE- SACRAL 1 ONE ;  Surgeon: Consuella Lose, MD;  Location: [place];  Service: Neurosurgery;  Laterality: Right;   PERIPHERAL VASCULAR BALLOON ANGIOPLASTY Left 05/19/2021   Procedure: PERIPHERAL VASCULAR BALLOON ANGIOPLASTY;  Surgeon: Marty Heck, MD;  Location: [place] [facility];  Service: Cardiovascular;  Laterality: Left;   PERIPHERAL VASCULAR INTERVENTION Left 01/10/2021   Procedure: PERIPHERAL VASCULAR INTERVENTION;  Surgeon: Waynetta Sandy, MD;  Location: [place] [facility];  Service: Cardiovascular;  Laterality: Left;   THROMBECTOMY W/ EMBOLECTOMY Left 11/09/2020   Procedure: THROMBECTOMY OF LEFT ARTERIOVENOUS FISTULA;  Surgeon: Angelia Mould, MD;  Location: [facility] OR;  Service: Vascular;  Laterality: Left;   TONSILLECTOMY AND  ADENOIDECTOMY     WISDOM TOOTH EXTRACTION     WOUND DEBRIDEMENT Right 05/13/2019   Procedure: DEBRIDEMENT WOUND;  Surgeon: Evelina Bucy, DPM;  Location: [place];  Service: Podiatry;  Laterality: Right;    Family History  Problem Relation Age of Onset   Heart attack Sister        died @ 3   Cancer Mother        died @ 89; unknown type   Diabetes Brother        deceased   Cirrhosis Father        alcohol related   Diabetes Father    Esophageal cancer Neg Hx    Colon cancer Neg Hx    Pancreatic cancer Neg Hx    Stomach cancer Neg Hx     Social History:  reports that he quit smoking about 45 years ago. His smoking use included cigarettes. He has a 2.00 pack-year smoking history. He has never used smokeless tobacco. He reports that he does not currently use alcohol. He reports that he does not currently use drugs after having used the following drugs: Marijuana.  Allergies:  Allergies  Allergen Reactions   Contrast Media [Iodinated Contrast Media]  Anaphylaxis    Cardiac arrest, Throat swelling, hives, SOB   Kiwi Extract Itching, Swelling and Other (See Comments)    Lips and face swell- breathing not affected   Flexeril [Cyclobenzaprine]     Hands become flimsy, can not hold things   Tape Other (See Comments)    "Plastic" tape causes blisters!!    Medications: Scheduled: Continuous:  sodium chloride 500 mL (01/17/22 1146)    Results for orders placed or performed during the hospital encounter of 01/17/22 (from the past 24 hour(s))  I-STAT, chem 8     Status: Abnormal   Collection Time: 01/17/22 11:38 AM  Result Value Ref Range   Sodium 137 135 - 145 mmol/L   Potassium 3.9 3.5 - 5.1 mmol/L   Chloride 95 (L) 98 - 111 mmol/L   BUN 35 (H) 8 - 23 mg/dL   Creatinine, Ser 9.80 (H) 0.61 - 1.24 mg/dL   Glucose, Bld 111 (H) 70 - 99 mg/dL   Calcium, Ion 1.05 (L) 1.15 - 1.40 mmol/L   TCO2 33 (H) 22 - 32 mmol/L   Hemoglobin 15.0 13.0 - 17.0 g/dL   HCT 44.0 39.0 - 52.0 %     No results found.  ROS:  As stated above in the HPI otherwise negative.  Blood pressure (!) 129/109, pulse 91, temperature 97.9 F (36.6 C), temperature source Oral, resp. rate 12, height '5\' 9"'$  (1.753 m), weight 78 kg, SpO2 96 %.    PE: Gen: NAD, Alert and Oriented HEENT:  Jon Anderson, EOMI Neck: Supple, no LAD Lungs: CTA Bilaterally CV: RRR without M/G/R ABD: Soft, NTND, +BS Ext: No C/C/E  Assessment/Plan: 1) Personal history of polyps - Colonoscopy.  Jon Anderson D 01/17/2022, 12:11 PM

## 2022-01-17 NOTE — Discharge Instructions (Signed)

## 2022-01-17 NOTE — Anesthesia Postprocedure Evaluation (Signed)
Anesthesia Post Note  Patient: Jon Anderson  Procedure(s) Performed: COLONOSCOPY WITH PROPOFOL POLYPECTOMY     Patient location during evaluation: Endoscopy Anesthesia Type: MAC Level of consciousness: awake and alert Pain management: pain level controlled Vital Signs Assessment: post-procedure vital signs reviewed and stable Respiratory status: spontaneous breathing, nonlabored ventilation, respiratory function stable and patient connected to nasal cannula oxygen Cardiovascular status: blood pressure returned to baseline and stable Postop Assessment: no apparent nausea or vomiting Anesthetic complications: no   No notable events documented.  Last Vitals:  Vitals:   01/17/22 1318 01/17/22 1320  BP:  (!) 123/59  Pulse: 76 76  Resp: 13 15  Temp:    SpO2: 95% 95%    Last Pain:  Vitals:   01/17/22 1320  TempSrc:   PainSc: 0-No pain                 Jocelin Schuelke DANIEL

## 2022-01-17 NOTE — Op Note (Signed)
[facility] Patient Name: Jon Anderson Procedure Date: 01/17/2022 MRN: [id number] Attending MD: Carol Ada , MD Date of Birth: Nov 26, 1950 CSN: [id number] Age: 71 Admit Type: Outpatient Procedure:                Colonoscopy Indications:              High risk colon cancer surveillance: Personal                            history of colonic polyps Providers:                Carol Ada, MD, Jaci Carrel, RN, William Dalton, Technician Referring MD:             Carol Ada, MD Medicines:                Propofol per Anesthesia Complications:            No immediate complications. Estimated Blood Loss:     Estimated blood loss: none. Procedure:                Pre-Anesthesia Assessment:                           - Prior to the procedure, a History and Physical                            was performed, and patient medications and                            allergies were reviewed. The patient's tolerance of                            previous anesthesia was also reviewed. The risks                            and benefits of the procedure and the sedation                            options and risks were discussed with the patient.                            All questions were answered, and informed consent                            was obtained. Prior Anticoagulants: The patient has                            taken no previous anticoagulant or antiplatelet                            agents. ASA Grade Assessment: III - A patient with  severe systemic disease. After reviewing the risks                            and benefits, the patient was deemed in                            satisfactory condition to undergo the procedure.                           - Sedation was administered by an anesthesia                            professional. Deep sedation was attained.                           After obtaining  informed consent, the colonoscope                            was passed under direct vision. Throughout the                            procedure, the patient's blood pressure, pulse, and                            oxygen saturations were monitored continuously. The                            CF-HQ190L ([id number]) Olympus colonoscope was                            introduced through the anus and advanced to the the                            cecum, identified by appendiceal orifice and                            ileocecal valve. The colonoscopy was performed                            without difficulty. The patient tolerated the                            procedure well. The quality of the bowel                            preparation was evaluated using the BBPS [facility]                            Bowel Preparation Scale) with scores of: Right                            Colon = 3, Transverse Colon = 3 and Left Colon = 3                            (  entire mucosa seen well with no residual staining,                            small fragments of stool or opaque liquid). The                            total BBPS score equals 9. The ileocecal valve,                            appendiceal orifice, and rectum were photographed. Scope In: 12:22:23 PM Scope Out: 12:36:25 PM Scope Withdrawal Time: 0 hours 11 minutes 12 seconds  Total Procedure Duration: 0 hours 14 minutes 2 seconds  Findings:      Three pedunculated and sessile polyps were found in the descending       colon, transverse colon and cecum. The polyps were 2 to 7 mm in size.       These polyps were removed with a hot snare. Resection and retrieval were       complete.      A few medium-mouthed diverticula were found in the sigmoid colon.      With his severe comorbidities a repeat colonoscopy is not recommended. Impression:               - Three 2 to 7 mm polyps in the descending colon,                            in the transverse colon  and in the cecum, removed                            with a hot snare. Resected and retrieved.                           - Diverticulosis in the sigmoid colon. Moderate Sedation:      Not Applicable - Patient had care per Anesthesia. Recommendation:           - Patient has a contact number available for                            emergencies. The signs and symptoms of potential                            delayed complications were discussed with the                            patient. Return to normal activities tomorrow.                            Written discharge instructions were provided to the                            patient.                           - Resume previous diet.                           -  Continue present medications.                           - Await pathology results.                           - Repeat colonoscopy is not recommended for                            surveillance.                           - Okay to resume Plavix. Procedure Code(s):        --- Professional ---                           [phone], Colonoscopy, flexible; with removal of                            tumor(s), polyp(s), or other lesion(s) by snare                            technique Diagnosis Code(s):        --- Professional ---                           K63.5, Polyp of colon                           Z86.010, Personal history of colonic polyps                           K57.30, Diverticulosis of large intestine without                            perforation or abscess without bleeding CPT copyright 2019 [employer]. All rights reserved. The codes documented in this report are preliminary and upon coder review may  be revised to meet current compliance requirements. Carol Ada, MD Carol Ada, MD 01/17/2022 12:41:57 PM This report has been signed electronically. Number of Addenda: 0

## 2022-01-17 NOTE — Transfer of Care (Signed)
Immediate Anesthesia Transfer of Care Note  Patient: Jon Anderson  Procedure(s) Performed: COLONOSCOPY WITH PROPOFOL POLYPECTOMY  Patient Location: Endoscopy Unit  Anesthesia Type:General  Level of Consciousness: drowsy  Airway & Oxygen Therapy: Patient Spontanous Breathing and Patient connected to nasal cannula oxygen  Post-op Assessment: Report given to RN and Post -op Vital signs reviewed and stable  Post vital signs: Reviewed and stable  Last Vitals:  Vitals Value Taken Time  BP 95/64   Temp    Pulse 78 01/17/22 1248  Resp 12 01/17/22 1248  SpO2 100 % 01/17/22 1248  Vitals shown include unvalidated device data.  Last Pain:  Vitals:   01/17/22 1116  TempSrc: Oral  PainSc: 0-No pain         Complications: No notable events documented.

## 2022-01-18 LAB — SURGICAL PATHOLOGY

## 2022-01-20 ENCOUNTER — Other Ambulatory Visit: Payer: Self-pay

## 2022-01-20 ENCOUNTER — Other Ambulatory Visit (HOSPITAL_COMMUNITY): Payer: Self-pay | Admitting: Radiology

## 2022-01-20 ENCOUNTER — Encounter (HOSPITAL_COMMUNITY): Payer: Self-pay | Admitting: Gastroenterology

## 2022-01-20 ENCOUNTER — Ambulatory Visit (HOSPITAL_COMMUNITY)
Admission: RE | Admit: 2022-01-20 | Discharge: 2022-01-20 | Disposition: A | Discharge status: Home / Self Care | Payer: Medicare Other | Source: Ambulatory Visit | Attending: Nephrology | Admitting: Nephrology

## 2022-01-20 ENCOUNTER — Other Ambulatory Visit (HOSPITAL_COMMUNITY): Payer: Self-pay | Admitting: Nephrology

## 2022-01-20 ENCOUNTER — Emergency Department (HOSPITAL_COMMUNITY)
Admission: EM | Admit: 2022-01-20 | Discharge: 2022-01-21 | Discharge status: Against Medical Advice | Payer: Medicare Other | Attending: Nephrology | Admitting: Nephrology

## 2022-01-20 ENCOUNTER — Encounter (HOSPITAL_COMMUNITY): Payer: Self-pay | Admitting: Interventional Radiology

## 2022-01-20 ENCOUNTER — Emergency Department (HOSPITAL_COMMUNITY): Payer: Medicare Other

## 2022-01-20 DIAGNOSIS — Z992 Dependence on renal dialysis: Secondary | ICD-10-CM | POA: Insufficient documentation

## 2022-01-20 DIAGNOSIS — N186 End stage renal disease: Secondary | ICD-10-CM

## 2022-01-20 DIAGNOSIS — Y841 Kidney dialysis as the cause of abnormal reaction of the patient, or of later complication, without mention of misadventure at the time of the procedure: Secondary | ICD-10-CM | POA: Insufficient documentation

## 2022-01-20 DIAGNOSIS — T82898A Other specified complication of vascular prosthetic devices, implants and grafts, initial encounter: Secondary | ICD-10-CM | POA: Insufficient documentation

## 2022-01-20 DIAGNOSIS — I2510 Atherosclerotic heart disease of native coronary artery without angina pectoris: Secondary | ICD-10-CM | POA: Insufficient documentation

## 2022-01-20 DIAGNOSIS — Z7902 Long term (current) use of antithrombotics/antiplatelets: Secondary | ICD-10-CM | POA: Insufficient documentation

## 2022-01-20 DIAGNOSIS — Z7982 Long term (current) use of aspirin: Secondary | ICD-10-CM | POA: Insufficient documentation

## 2022-01-20 DIAGNOSIS — E1122 Type 2 diabetes mellitus with diabetic chronic kidney disease: Secondary | ICD-10-CM | POA: Insufficient documentation

## 2022-01-20 DIAGNOSIS — K219 Gastro-esophageal reflux disease without esophagitis: Secondary | ICD-10-CM | POA: Insufficient documentation

## 2022-01-20 DIAGNOSIS — Z87891 Personal history of nicotine dependence: Secondary | ICD-10-CM | POA: Insufficient documentation

## 2022-01-20 DIAGNOSIS — E1151 Type 2 diabetes mellitus with diabetic peripheral angiopathy without gangrene: Secondary | ICD-10-CM | POA: Insufficient documentation

## 2022-01-20 DIAGNOSIS — Z91041 Radiographic dye allergy status: Secondary | ICD-10-CM | POA: Insufficient documentation

## 2022-01-20 DIAGNOSIS — I120 Hypertensive chronic kidney disease with stage 5 chronic kidney disease or end stage renal disease: Secondary | ICD-10-CM | POA: Insufficient documentation

## 2022-01-20 DIAGNOSIS — T82838A Hemorrhage of vascular prosthetic devices, implants and grafts, initial encounter: Secondary | ICD-10-CM | POA: Diagnosis present

## 2022-01-20 HISTORY — PX: IR FLUORO GUIDE CV LINE RIGHT: IMG2283

## 2022-01-20 HISTORY — PX: IR US GUIDE VASC ACCESS RIGHT: IMG2390

## 2022-01-20 LAB — POCT I-STAT, CHEM 8
BUN: 54 mg/dL — ABNORMAL HIGH (ref 8–23)
Calcium, Ion: 1.06 mmol/L — ABNORMAL LOW (ref 1.15–1.40)
Chloride: 98 mmol/L (ref 98–111)
Creatinine, Ser: 17.3 mg/dL — ABNORMAL HIGH (ref 0.61–1.24)
Glucose, Bld: 89 mg/dL (ref 70–99)
HCT: 36 % — ABNORMAL LOW (ref 39.0–52.0)
Hemoglobin: 12.2 g/dL — ABNORMAL LOW (ref 13.0–17.0)
Potassium: 4 mmol/L (ref 3.5–5.1)
Sodium: 137 mmol/L (ref 135–145)
TCO2: 29 mmol/L (ref 22–32)

## 2022-01-20 LAB — GLUCOSE, CAPILLARY: Glucose-Capillary: 88 mg/dL (ref 70–99)

## 2022-01-20 LAB — PROTIME-INR
INR: 1 (ref 0.8–1.2)
Prothrombin Time: 13 seconds (ref 11.4–15.2)

## 2022-01-20 MED ORDER — CEFAZOLIN SODIUM-DEXTROSE 2-4 GM/100ML-% IV SOLN
2.0000 g | Freq: Once | INTRAVENOUS | Status: AC
  Administered 2022-02-16: 2 g via INTRAVENOUS

## 2022-01-20 MED ORDER — FENTANYL CITRATE (PF) 100 MCG/2ML IJ SOLN
INTRAMUSCULAR | Status: AC | PRN
  Administered 2022-01-20: 50 ug via INTRAVENOUS

## 2022-01-20 MED ORDER — MIDAZOLAM HCL 2 MG/2ML IJ SOLN
INTRAMUSCULAR | Status: AC | PRN
  Administered 2022-01-20: 1 mg via INTRAVENOUS

## 2022-01-20 MED ORDER — CEFAZOLIN SODIUM-DEXTROSE 2-4 GM/100ML-% IV SOLN
INTRAVENOUS | Status: AC
  Filled 2022-01-20: qty 100

## 2022-01-20 MED ORDER — HEPARIN SODIUM (PORCINE) 1000 UNIT/ML IJ SOLN
INTRAMUSCULAR | Status: AC | PRN
  Administered 2022-01-20: 3200 [IU] via INTRAVENOUS

## 2022-01-20 MED ORDER — CEFAZOLIN SODIUM-DEXTROSE 2-4 GM/100ML-% IV SOLN: 2.0000 g | Freq: Three times a day (TID) | INTRAVENOUS | Status: DC

## 2022-01-20 MED ORDER — LIDOCAINE HCL 1 % IJ SOLN
INTRAMUSCULAR | Status: AC
  Filled 2022-01-20: qty 20

## 2022-01-20 MED ORDER — SODIUM CHLORIDE 0.9 % IV SOLN: INTRAVENOUS | Status: DC

## 2022-01-20 MED ORDER — FENTANYL CITRATE (PF) 100 MCG/2ML IJ SOLN
INTRAMUSCULAR | Status: AC
  Filled 2022-01-20: qty 2

## 2022-01-20 MED ORDER — HEPARIN SODIUM (PORCINE) 1000 UNIT/ML IJ SOLN
INTRAMUSCULAR | Status: AC
  Filled 2022-01-20: qty 10

## 2022-01-20 MED ORDER — MIDAZOLAM HCL 2 MG/2ML IJ SOLN
INTRAMUSCULAR | Status: AC
  Filled 2022-01-20: qty 2

## 2022-01-20 MED ORDER — LIDOCAINE HCL 1 % IJ SOLN
INTRAMUSCULAR | Status: AC | PRN
  Administered 2022-01-20: 15 mL via INTRADERMAL

## 2022-01-20 MED ORDER — CEFAZOLIN SODIUM-DEXTROSE 2-4 GM/100ML-% IV SOLN
INTRAVENOUS | Status: AC | PRN
  Administered 2022-01-20: 2 g via INTRAVENOUS

## 2022-01-20 NOTE — ED Provider Triage Note (Signed)
Emergency Medicine Provider Triage Evaluation Note  Jon Anderson , a 71 y.o. male  was evaluated in triage.  Pt complains of bleeding from his dialysis catheter was displaced today, states he has some pain in the area no shortness of breath no pleuritic chest pain currently supposed to get dialysis tomorrow.  Not on anticoag's..  Review of Systems  Positive: Bleeding, pain Negative: Breath, abdominal pain  Physical Exam  BP (!) 151/86   Pulse 61   Temp 98.1 F (36.7 C)   Resp 18   SpO2 96%  Gen:   Awake, no distress   Resp:  Normal effort  MSK:   Moves extremities without difficulty  Other:    Medical Decision Making  Medically screening exam initiated at 8:40 PM.  Appropriate orders placed.  Jon Anderson was informed that the remainder of the evaluation will be completed by another provider, this initial triage assessment does not replace that evaluation, and the importance of remaining in the ED until their evaluation is complete.  Lab work imaging been ordered will need further work-up.   [name], PA-C 01/20/22 2041

## 2022-01-20 NOTE — H&P (Signed)
Chief Complaint: Clotted dialysis access. Request is for tunneled HD catheter placement.   Referring Physician(s): [place]  Supervising Physician: Ruthann Cancer  Patient Status: [facility] - Inpatient - Out-pt  History of Present Illness: Jon Anderson is a 71 y.o. male  outpatient. History of  HTN, GERD, DM, CAD, PAD, ESRD on HD.  He has had  multiple previous left upper extremity accesses most recently a basilic vein fistula that also required an interposition cephalic vein.  He is undergone balloon angioplasty but unfortunately this did not mature. Patient receives dialysis through a left upper arm fistula with interposition AV graft. The graft was placed on 11.8.22 by Vascular surgery. Team states that the dialysis access is clotted. Last dialysis session was on Monday. Patient has elected for no intervention at this time. Team is requesting for a tunneled catheter for HD for dialysis access.   Currently without any significant complaints. Patient alert and laying in bed,calm. Denies any fevers, headache, chest pain, SOB, cough, abdominal pain, nausea, vomiting or bleeding. Return precautions and treatment recommendations and follow-up discussed with the patient  who is agreeable with the plan.    Past Medical History:  Diagnosis Date   Anemia of chronic disease    Arthritis    CAD (coronary artery disease)    a.  Myoview 4/11: EF 53%, no scar or ischemia   c. MV 2012 Nl perfusion, apical thinning.  No ischemia or scar.  EF 49%, appears greater by visual estimate.;  d.  Dob stress echo 12/13:  Negative Dob stress echo. There is no evidence of ischemia.  The LVF is normal. b. Normal cors 2016.   Carotid stenosis    a. <62% RICA, >95% LICA by duplex 05/8411   Chronic chest pain    occ   Constipation    chronic   Dyspnea    occasional with extertion   ESRD (end stage renal disease) on dialysis ([place])    M-W-F- [name]   GERD (gastroesophageal reflux disease)    History of hiatal  hernia    History of kidney stones    Passed   HNP (herniated nucleus pulposus), lumbar    HTN (hypertension)    echo 3/10: EF 60%, LAE   Hyperlipidemia    Nephrolithiasis    "passed them all"   Peripheral arterial disease ([place])    a. s/p PTCA  right    Pneumonia yrs ago   Restless legs    Sciatic leg pain    Sleep apnea    does not use cpap   Snores    a. presumed OSA, pt has refused sleep eval in past.   Type II diabetes mellitus ([place])    no longer on medications, checks blood glucose at home   Urinary frequency    Uses wheelchair    Walker as ambulation aid     Past Surgical History:  Procedure Laterality Date   A/V FISTULAGRAM Left 01/10/2021   Procedure: A/V FISTULAGRAM;  Surgeon: Waynetta Sandy, MD;  Location: [place] [facility];  Service: Cardiovascular;  Laterality: Left;   A/V FISTULAGRAM Left 05/19/2021   Procedure: A/V Fistulagram;  Surgeon: Marty Heck, MD;  Location: [place] [facility];  Service: Cardiovascular;  Laterality: Left;   ABDOMINAL AORTOGRAM W/LOWER EXTREMITY Bilateral 08/21/2019   Procedure: ABDOMINAL AORTOGRAM W/LOWER EXTREMITY;  Surgeon: Waynetta Sandy, MD;  Location: [place] [facility];  Service: Cardiovascular;  Laterality: Bilateral;   AMPUTATION Right 07/25/2019   Procedure: RIGHT 5th  RAY AMPUTATION;  Surgeon: Newt Minion, MD;  Location: [place];  Service: Orthopedics;  Laterality: Right;   AMPUTATION Right 11/19/2019   Procedure: RIGHT TRANSMETATARSAL AMPUTATION;  Surgeon: Newt Minion, MD;  Location: [place];  Service: Orthopedics;  Laterality: Right;   ANGIOPLASTY / STENTING FEMORAL Left 12/11/2013   dr berry   AV FISTULA PLACEMENT Left 03/19/2014   Procedure: CREATION OF ARTERIOVENOUS (AV) FISTULA  LEFT UPPER ARM;  Surgeon: Mal Misty, MD;  Location: [place];  Service: Vascular;  Laterality: Left;   AV FISTULA PLACEMENT Left 11/09/2020   Procedure: LEFT ARM ARTERIOVENOUS (AV) FISTULA CREATION;  Surgeon: Waynetta Sandy, MD;  Location: [place];  Service: Vascular;  Laterality: Left;   AV FISTULA PLACEMENT Left 02/15/2021   Procedure: INSERTION OF LEFT ARM ARTERIOVENOUS GORE-TEX GRAFT;  Surgeon: Waynetta Sandy, MD;  Location: [place];  Service: Vascular;  Laterality: Left;   BACK SURGERY  01/2018   screws placed    [place] Left 12/14/2020   Procedure: REVISION OF LEFT ARM SECOND STAGE BASILIC VEIN TRANSPOSITION;  Surgeon: Waynetta Sandy, MD;  Location: [place];  Service: Vascular;  Laterality: Left;   CARDIAC CATHETERIZATION  2001 and 2010    COLONOSCOPY W/ BIOPSIES AND POLYPECTOMY     COLONOSCOPY WITH PROPOFOL N/A 08/01/2016   Procedure: COLONOSCOPY WITH PROPOFOL;  Surgeon: Carol Ada, MD;  Location: [facility];  Service: Endoscopy;  Laterality: N/A;   COLONOSCOPY WITH PROPOFOL N/A 01/17/2022   Procedure: COLONOSCOPY WITH PROPOFOL;  Surgeon: Carol Ada, MD;  Location: [facility];  Service: Gastroenterology;  Laterality: N/A;   CORONARY ARTERY BYPASS GRAFT  2019   [facility] x [facility] bypass   ESOPHAGOGASTRODUODENOSCOPY (EGD) WITH PROPOFOL N/A 08/01/2016   Procedure: ESOPHAGOGASTRODUODENOSCOPY (EGD) WITH PROPOFOL;  Surgeon: Carol Ada, MD;  Location: [facility];  Service: Endoscopy;  Laterality: N/A;   FISTULA SUPERFICIALIZATION Left 02/10/2020   Procedure: 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;  Surgeon: Rosetta Posner, MD;  Location: [place];  Service: Vascular;  Laterality: Left;   FISTULA SUPERFICIALIZATION Left 03/23/2020   Procedure: LEFT UPPER EXTREMITY ARTERIOVENOUS FISTULA PLICATION;  Surgeon: Rosetta Posner, MD;  Location: [place];  Service: Vascular;  Laterality: Left;   FOOT FRACTURE SURGERY Right    ligament repair   FRACTURE SURGERY     left forearm   GRAFT APPLICATION Right 10/13/2829   Procedure: FAT GRAFT APPLICATION;  Surgeon: Evelina Bucy, DPM;  Location: [place];  Service: Podiatry;  Laterality: Right;    HEMATOMA EVACUATION Left 11/09/2020   Procedure: EVACUATION HEMATOMA LEFT ARM;  Surgeon: Angelia Mould, MD;  Location: [facility] OR;  Service: Vascular;  Laterality: Left;   INGUINAL HERNIA REPAIR Left    IR AV DIALY SHUNT INTRO NEEDLE/INTRACATH INITIAL W/PTA/IMG LEFT  11/15/2021   IR REMOVAL TUN CV CATH W/O FL  06/16/2021   IR US GUIDE VASC ACCESS LEFT  11/15/2021   IR US GUIDE VASC ACCESS LEFT  11/15/2021   IR US GUIDE VASC ACCESS RIGHT  11/15/2021   LEFT HEART CATH AND CORS/GRAFTS ANGIOGRAPHY N/A 04/27/2017   Procedure: LEFT HEART CATH AND CORS/GRAFTS ANGIOGRAPHY;  Surgeon: Leonie Man, MD;  Location: [place] [facility];  Service: Cardiovascular;  Laterality: N/A;   LEFT HEART CATHETERIZATION WITH CORONARY ANGIOGRAM N/A 06/22/2014   Procedure: LEFT HEART CATHETERIZATION WITH CORONARY ANGIOGRAM;  Surgeon: Troy Sine, MD;  Location: [facility];  Service: Cardiovascular;  Laterality: N/A;  LOWER EXTREMITY ANGIOGRAM Left 12/11/2013   Procedure: LOWER EXTREMITY ANGIOGRAM;  Surgeon: Lorretta Harp, MD;  Location: [facility] CATH LAB;  Service: Cardiovascular;  Laterality: Left;   LUMBAR LAMINECTOMY/DECOMPRESSION MICRODISCECTOMY Right 07/03/2017   Procedure: MICRODISCECTOMY LUMBAR FIVE - SACRAL ONE RIGHT;  Surgeon: Consuella Lose, MD;  Location: [place];  Service: Neurosurgery;  Laterality: Right;   LUMBAR LAMINECTOMY/DECOMPRESSION MICRODISCECTOMY Right 10/19/2017   Procedure: MICRODISCECTOMY LUMBAR FIVE- SACRAL 1 ONE ;  Surgeon: Consuella Lose, MD;  Location: [place];  Service: Neurosurgery;  Laterality: Right;   PERIPHERAL VASCULAR BALLOON ANGIOPLASTY Left 05/19/2021   Procedure: PERIPHERAL VASCULAR BALLOON ANGIOPLASTY;  Surgeon: Marty Heck, MD;  Location: [place] [facility];  Service: Cardiovascular;  Laterality: Left;   PERIPHERAL VASCULAR INTERVENTION Left 01/10/2021   Procedure: PERIPHERAL VASCULAR INTERVENTION;  Surgeon: Waynetta Sandy, MD;  Location: [place] [facility];   Service: Cardiovascular;  Laterality: Left;   POLYPECTOMY  01/17/2022   Procedure: POLYPECTOMY;  Surgeon: Carol Ada, MD;  Location: [facility];  Service: Gastroenterology;;   THROMBECTOMY W/ EMBOLECTOMY Left 11/09/2020   Procedure: THROMBECTOMY OF LEFT ARTERIOVENOUS FISTULA;  Surgeon: Angelia Mould, MD;  Location: [place];  Service: Vascular;  Laterality: Left;   TONSILLECTOMY AND ADENOIDECTOMY     WISDOM TOOTH EXTRACTION     WOUND DEBRIDEMENT Right 05/13/2019   Procedure: [place];  Surgeon: Evelina Bucy, DPM;  Location: [place];  Service: Podiatry;  Laterality: Right;    Allergies: Contrast media [iodinated contrast media], Kiwi extract, Flexeril [cyclobenzaprine], and Tape  Medications: Prior to Admission medications   Medication Sig Start Date End Date Taking? Authorizing Provider  acetaminophen (TYLENOL) 500 MG tablet Take 1,000 mg by mouth every 6 (six) hours as needed for moderate pain.    [provider]  aspirin EC 81 MG tablet Take 1 tablet (81 mg total) by mouth daily. Patient taking differently: Take 81 mg by mouth at bedtime. 05/28/12   Richardson Dopp T, PA-C  atorvastatin (LIPITOR) 40 MG tablet Take 1 tablet (40 mg total) by mouth daily at 6 PM. 12/01/19   Angiulli, Lavon Paganini, PA-C  benzonatate (TESSALON) 100 MG capsule Take 1 capsule (100 mg total) by mouth every 8 (eight) hours. 11/26/21   Tretha Sciara, MD  CALCITRIOL PO Take 1 tablet by mouth every Monday, Wednesday, and Friday.    [provider]  calcium acetate (PHOSLO) 667 MG capsule Take 2 capsules (1,334 mg total) by mouth 2 (two) times daily with a meal. 12/01/19   Angiulli, Lavon Paganini, PA-C  clopidogrel (PLAVIX) 75 MG tablet Take 75 mg by mouth daily. 12/01/19   [provider]  Etelcalcetide HCl (PARSABIV IV) Etelcalcetide Hermina Staggers) 08/03/21 08/02/22  [provider]  gabapentin (NEURONTIN) 100 MG capsule Take 100-200 mg by mouth See admin  instructions. 100 mg in the morning, 200 mg in the evening 11/28/19   [provider]  HYDROcodone bit-homatropine (HYCODAN) 5-1.5 MG/5ML syrup Take 5 mLs by mouth daily as needed for pain. 11/30/21   [provider]  loperamide (IMODIUM) 2 MG capsule Take 2 mg by mouth as needed for diarrhea or loose stools.    [provider]  midodrine (PROAMATINE) 10 MG tablet Take 10 mg by mouth 2 (two) times daily as needed. 12/01/19   [provider]  multivitamin (RENA-VIT) TABS tablet Take 1 tablet by mouth at bedtime. 12/01/19   Angiulli, Lavon Paganini, PA-C  ONETOUCH ULTRA test strip See admin instructions. 01/14/21  [provider]  pantoprazole (PROTONIX) 40 MG tablet Take 40 mg by mouth daily.    [provider]  predniSONE (DELTASONE) 50 MG tablet Take one tablet at 13 hours, 7 hours and 1 hour prior to your procedure. 11/10/21   [place]ton, Ardeth Perfect, NP  senna-docusate (SENOKOT-S) 8.6-50 MG tablet Take 2 tablets by mouth 2 (two) times daily as needed for mild constipation.    [provider]     Family History  Problem Relation Age of Onset   Heart attack Sister        died @ 75   Cancer Mother        died @ 26; unknown type   Diabetes Brother        deceased   Cirrhosis Father        alcohol related   Diabetes Father    Esophageal cancer Neg Hx    Colon cancer Neg Hx    Pancreatic cancer Neg Hx    Stomach cancer Neg Hx     Social History   Socioeconomic History   Marital status: Married    Spouse name: Not on file   Number of children: Not on file   Years of education: Not on file   Highest education level: Not on file  Occupational History   Occupation: retired  Tobacco Use   Smoking status: Former    Packs/day: 1.00    Years: 2.00    Total pack years: 2.00    Types: Cigarettes    Quit date: 1978    Years since quitting: 45.8   Smokeless tobacco: Never  Vaping Use   Vaping Use: Never used  Substance and Sexual  Activity   Alcohol use: Not Currently    Alcohol/week: 0.0 standard drinks of alcohol    Comment: h/o social drinking   Drug use: Not Currently    Types: Marijuana    Comment: quit 1986   Sexual activity: Yes    Birth control/protection: None  Other Topics Concern   Not on file  Social History Narrative   The patient is a Retail buyer.  He is married and has 2 grown children.  Lives in [place] with his wife.  Denies tobacco, alcohol or IV drug abuse or marijuana or cocaine intake.    Social Determinants of Health   Financial Resource Strain: Not on file  Food Insecurity: Not on file  Transportation Needs: Not on file  Physical Activity: Not on file  Stress: Not on file  Social Connections: Not on file    Review of Systems: A 12 point ROS discussed and pertinent positives are indicated in the HPI above.  All other systems are negative.  Review of Systems  Constitutional:  Negative for fever.  HENT:  Negative for congestion.   Respiratory:  Negative for cough and shortness of breath.   Cardiovascular:  Negative for chest pain.  Gastrointestinal:  Negative for abdominal pain.  Neurological:  Negative for headaches.  Psychiatric/Behavioral:  Negative for behavioral problems and confusion.     Vital Signs: BP (!) 155/92   Pulse 62   Temp 98 F (36.7 C) (Oral)   Resp 15   Ht '5\' 9"'$  (1.753 m)   Wt 172 lb (78 kg)   SpO2 96%   BMI 25.40 kg/m     Physical Exam Vitals and nursing note reviewed.  Constitutional:      Appearance: He is well-developed.  HENT:     Head: Normocephalic.  Cardiovascular:  Rate and Rhythm: Normal rate and regular rhythm.     Heart sounds: Normal heart sounds.     Comments: LUE AVF with graft -/- Pulmonary:     Effort: Pulmonary effort is normal.     Breath sounds: Normal breath sounds.  Musculoskeletal:        General: Normal range of motion.     Cervical back: Normal range of motion.  Skin:    General: Skin is dry.  Neurological:      Mental Status: He is alert and oriented to person, place, and time.     Imaging: VAS US CAROTID  Result Date: 12/27/2021 Carotid Arterial Duplex Study Patient Name:  COCHISE DINNEEN  Date of Exam:   12/27/2021 Medical Rec #: [id number]           Accession #:    [id number] Date of Birth: [birth year]           Patient Gender: M Patient Age:   49 years Exam Location:  [facility] Procedure:      VAS US CAROTID Referring Phys: Roderic Palau [name] --------------------------------------------------------------------------------  Indications:   Carotid artery disease. Patient denies any cerebrovascular                symptoms at this time. Risk Factors:  Hypertension, hyperlipidemia, Diabetes, past history of smoking,                coronary artery disease, PAD. Other Factors: ESRD on dialysis. Performing Technologist: Mariane Masters RVT  Examination Guidelines: A complete evaluation includes B-mode imaging, spectral Doppler, color Doppler, and power Doppler as needed of all accessible portions of each vessel. Bilateral testing is considered an integral part of a complete examination. Limited examinations for reoccurring indications may be performed as noted.  Right Carotid Findings: +----------+--------+--------+--------+------------------+--------+           PSV cm/sEDV cm/sStenosisPlaque DescriptionComments +----------+--------+--------+--------+------------------+--------+ CCA Prox  68      9                                          +----------+--------+--------+--------+------------------+--------+ CCA Mid   77      15      <50%    heterogenous               +----------+--------+--------+--------+------------------+--------+ CCA Distal74      18                                         +----------+--------+--------+--------+------------------+--------+ ICA Prox  71      14              heterogenous               +----------+--------+--------+--------+------------------+--------+ ICA  Mid   76      20      1-39%                              +----------+--------+--------+--------+------------------+--------+ ICA Distal76      28                                         +----------+--------+--------+--------+------------------+--------+ ECA  241     18                                         +----------+--------+--------+--------+------------------+--------+ +----------+--------+-------+----------------+-------------------+           PSV cm/sEDV cmsDescribe        Arm Pressure (mmHG) +----------+--------+-------+----------------+-------------------+ [id number]      0      Multiphasic, [id number]                 +----------+--------+-------+----------------+-------------------+ +---------+--------+--+--------+--+---------+ VertebralPSV cm/s75EDV cm/s14Antegrade +---------+--------+--+--------+--+---------+  Left Carotid Findings: +----------+--------+--------+--------+---------------------+------------------+           PSV cm/sEDV cm/sStenosisPlaque Description   Comments           +----------+--------+--------+--------+---------------------+------------------+ CCA Prox  79      12                                                      +----------+--------+--------+--------+---------------------+------------------+ CCA Mid   67      15                                                      +----------+--------+--------+--------+---------------------+------------------+ CCA Distal67      17                                                      +----------+--------+--------+--------+---------------------+------------------+ ICA Prox  373     53      40-59%  heterogenous and     high-end of range                                    diffuse              based on                                                                  PSV/plaque          +----------+--------+--------+--------+---------------------+------------------+ ICA Mid   137     27                                   turbulent          +----------+--------+--------+--------+---------------------+------------------+ ICA Distal41      16                                                      +----------+--------+--------+--------+---------------------+------------------+  ECA       355     40      >50%                                            +----------+--------+--------+--------+---------------------+------------------+ +----------+--------+--------+------------------------+-------------------+           PSV cm/sEDV cm/sDescribe                Arm Pressure (mmHG) +----------+--------+--------+------------------------+-------------------+ Subclavian240     35      increased EDV due to AVF                    +----------+--------+--------+------------------------+-------------------+ +---------+--------+--+--------+-+---------+ VertebralPSV cm/s51EDV cm/s9Antegrade +---------+--------+--+--------+-+---------+ Left arm AVF, unable to obtain BP  Summary: Right Carotid: Velocities in the right ICA are consistent with a 1-39% stenosis.                Non-hemodynamically significant plaque <50% noted in the CCA. The                ECA appears >50% stenosed. Left Carotid: Velocities in the left ICA are consistent with a 40-59% stenosis,               high-end of range, based on PSV/plaque. Non-hemodynamically               significant plaque <50% noted in the CCA. Heterogenous plaque in               the distal CCA extends into the ostial LICA and LECA. The ECA               appears >50% stenosed. Vertebrals:  Bilateral vertebral arteries demonstrate antegrade flow. Subclavians: Normal flow hemodynamics were seen in bilateral subclavian              arteries. *See table(s) above for measurements and observations. Suggest follow up study in 12 months. Electronically  signed by [name] MD on 12/27/2021 at 3:57:05 PM.    Final     Labs:  CBC: Recent Labs    11/16/21 0303 11/17/21 0736 11/18/21 0853 11/26/21 0850 01/17/22 1138  WBC 26.1* 13.6* 10.0 7.5  --   HGB 14.6 12.3* 10.8* 12.8* 15.0  HCT 42.4 35.9* 33.2* 39.7 44.0  PLT 254 153 142* 235  --     COAGS: No results for input(s): "INR", "APTT" in the last 8760 hours.  BMP: Recent Labs    11/16/21 0303 11/17/21 0736 11/18/21 0853 11/26/21 0850 01/17/22 1138  NA 128* 132* 134* 137 137  K 5.0 5.2* 3.5 4.0 3.9  CL 93* 93* 96* 95* 95*  CO2 19* '27 25 30  '$ --   GLUCOSE 246* 139* 129* 109* 111*  BUN 50* 34* 49* 21 35*  CALCIUM 9.4 8.8* 7.9* 9.1  --   CREATININE 10.52* 7.80* 9.36* 7.87* 9.80*  GFRNONAA 5* 7* 5* 7*  --     LIVER FUNCTION TESTS: Recent Labs    11/15/21 1314 11/18/21 0853 11/26/21 0850  BILITOT 0.5  --  0.7  AST 13*  --  20  ALT 9  --  20  ALKPHOS 64  --  82  PROT 4.4*  --  6.9  ALBUMIN 2.3* 2.8* 3.7     Assessment and Plan:  71 y.o. male  outpatient. History of  HTN, GERD, DM, CAD, PAD, ESRD on HD.  He has had  multiple previous left upper extremity accesses most recently a basilic vein fistula that also required an interposition cephalic vein.  He is undergone balloon angioplasty but unfortunately this did not mature. Patient receives dialysis through a left upper arm fistula with interposition AV graft. The graft was placed on 11.8.22 by Vascular surgery. Team states that the dialysis access is clotted. Last dialysis session was on Monday. Patient has elected for no intervention at this time. Team is requesting for a tunneled catheter for HD for dialysis access.   Patient is on ASA and Plavix. Labs pending. Allergies include contrast media reaction cardiac arrest, tape. Patient has been NPO since 5:30 this am.  Risks and benefits discussed with the patient including, but not limited to bleeding, infection, vascular injury, pulmonary embolism, need for  tunneled HD catheter placement or even death.  All of the patient's questions were answered, patient is agreeable to proceed. Consent signed and in chart.   Thank you for this interesting consult.  I greatly enjoyed meeting Jon Anderson and look forward to participating in their care.  A copy of this report was sent to the requesting provider on this date.  Electronically Signed: Jacqualine Mau, NP 01/20/2022, 1:29 PM   I spent a total of  30 Minutes   in face to face in clinical consultation, greater than 50% of which was counseling/coordinating care for tunneled HD catheter placement

## 2022-01-20 NOTE — Procedures (Signed)
Interventional Radiology Procedure Note  Procedure: Tunneled hemodialysis catheter placement  Findings: Please refer to procedural dictation for full description.19 cm right IJ tunneled HD, tip in right atrium.  Complications: None immediate  Estimated Blood Loss: < 5 mL  Recommendations: Catheter ready for immediate use.   Ruthann Cancer, MD

## 2022-01-20 NOTE — ED Triage Notes (Signed)
Patient noticed blood at dressing of his dialysis catheter insertion site this evening . No active bleeding at triage .

## 2022-01-21 NOTE — ED Notes (Signed)
Unable to get labs on pt.

## 2022-01-21 NOTE — ED Notes (Signed)
Pt was seen leaving by staff.

## 2022-01-21 NOTE — ED Notes (Signed)
Family at bedside. 

## 2022-01-24 ENCOUNTER — Ambulatory Visit (INDEPENDENT_AMBULATORY_CARE_PROVIDER_SITE_OTHER): Payer: Medicare Other | Admitting: Podiatry

## 2022-01-24 ENCOUNTER — Encounter: Payer: Self-pay | Admitting: Podiatry

## 2022-01-24 DIAGNOSIS — B351 Tinea unguium: Secondary | ICD-10-CM

## 2022-01-24 DIAGNOSIS — M2042 Other hammer toe(s) (acquired), left foot: Secondary | ICD-10-CM

## 2022-01-24 DIAGNOSIS — E1151 Type 2 diabetes mellitus with diabetic peripheral angiopathy without gangrene: Secondary | ICD-10-CM

## 2022-01-24 DIAGNOSIS — L84 Corns and callosities: Secondary | ICD-10-CM | POA: Diagnosis not present

## 2022-01-24 DIAGNOSIS — Z89431 Acquired absence of right foot: Secondary | ICD-10-CM | POA: Diagnosis not present

## 2022-01-24 NOTE — Progress Notes (Signed)
Subjective:  Patient ID: Jon Anderson, male    DOB: [birth year],  MRN: [id number]  Jon Anderson presents to clinic today for:  Chief Complaint  Patient presents with   Nail Problem    Diabetic foot care BS-did not check today A1C-7.0 Quinn Plowman PCP VST-12/2021 PCP VST-   Patient states left foot is swollen. Particularly the great toe is swollen. He denies any trauma or development of wound of the left foot.      PCP is Juanito Doom, MD.  Allergies  Allergen Reactions   Contrast Media [Iodinated Contrast Media] Anaphylaxis    Cardiac arrest, Throat swelling, hives, SOB   Kiwi Extract Itching, Swelling and Other (See Comments)    Lips and face swell- breathing not affected   Flexeril [Cyclobenzaprine]     Hands become flimsy, can not hold things   Tape Other (See Comments)    "Plastic" tape causes blisters!!    Review of Systems: Negative except as noted in the HPI.  Objective:   Jon Anderson is a pleasant 71 y.o. male WD, WN in NAD. AAO x 3. His face does seem fuller in appearance on today's visit. He denies any SOB.   Vascular Examination: He has adequate CFT to digits of left foot. Nonpalpable DP pulse(s) b/l LE. Nonpalpable PT pulse(s) b/l LE. Pedal hair absent. No pain with calf compression b/l. Lower extremity skin temperature gradient warm to warm.   Nonpitting edema RLE. Evidence of chronic venous insufficiency b/l LE. No ischemia or gangrene noted b/l LE. No cyanosis or clubbing noted b/l LE.  Dermatological Examination: No signs of acute bacterial infection noted. Pedal skin thin, shiny and atrophic b/l LE. No open wounds b/l LE. No interdigital macerations left foot. Toenails 1-5 left foot elongated, discolored, dystrophic, thickened, and crumbly with subungual debris and tenderness to dorsal palpation.   He has transverse healing abrasion lplantar aspect of left foot which is nearly healed. No erythema, no edema, no drainage, no  fluctuance.  Hyperkeratotic lesion(s) submet head 5 left foot, plantar aspect left hallux and distal stump of right LE.  No erythema, no edema, no drainage, no fluctuance, but he has some tenderness to plantar callus left foot.   Neurological Examination: Protective sensation diminished with 10g monofilament b/l.  Musculoskeletal Examination: Muscle strength 5/5 to all lower extremity muscle groups bilaterally.   Rigid hallux malleus right great toe.  Wearing extra depth diabetic shoes with prescribed diabetic inserts.   Palpable exostosis noted residual 5th metatarsal distally right stump. Lower extremity amputation(s): Transmetatarsal amputation right foot. Utilizes cane for ambulation assistance.  Assessment/Plan: 1. Onychomycosis   2. Callus   3. Hammertoe of left foot   4. S/P transmetatarsal amputation of foot, right ([place])   5. Type II diabetes mellitus with peripheral circulatory disorder (HCC)     No orders of the defined types were placed in this encounter.   -Patient was evaluated and treated. All patient's and/or POA's questions/concerns answered on today's visit. -Examined patient. -Discussed hammertoe left great toe. Patient declines xray of left foot today. I see no open wounds on left foot today and foot is perfused adequately. No concern for ischemia/gangrene today. Advised him to call office if he notices any change. -Continue diabetic shoes daily. -Mycotic toenails 1-5 bilaterally were debrided in length and girth with sterile nail nippers and dremel without incident. -Callus(es) distal stump right foot, distal tip of left great toe, and submet head 5 left foot pared utilizing rotary bur  without complication or incident. Total number pared =3. -Patient/POA to call should there be question/concern in the interim.   Return in about 3 months (around 04/26/2022).  Marzetta Board, DPM

## 2022-02-07 ENCOUNTER — Encounter: Payer: Self-pay | Admitting: Vascular Surgery

## 2022-02-07 ENCOUNTER — Ambulatory Visit (INDEPENDENT_AMBULATORY_CARE_PROVIDER_SITE_OTHER): Payer: Medicare Other | Admitting: Vascular Surgery

## 2022-02-07 VITALS — BP 102/60 | HR 70 | Temp 97.2°F | Resp 16 | Ht 69.0 in | Wt 176.0 lb

## 2022-02-07 DIAGNOSIS — N186 End stage renal disease: Secondary | ICD-10-CM

## 2022-02-07 NOTE — H&P (View-Only) (Signed)
Patient name: Jon Anderson MRN: [id number] DOB: 06/26/1950 Sex: male  REASON FOR CONSULT: Evaluate clotted left arm AV graft  HPI: Jon Anderson is a 71 y.o. male, with end-stage renal disease on dialysis Monday Wednesday Friday who is referred for evaluation of clotted left arm AV graft.  States his left arm graft clotted about 2 to 3 weeks ago.  He has been using a right IJ tunneled catheter for the last 2 to 3 weeks.  He initially had a left brachiocephalic AV fistula and then a left basilic vein fistula by Dr. Donzetta Matters on 11/09/2020.  This was ultimately converted to interposition cephalic vein and then most recently on 02/15/2021 he had an left upper extremity revision with AV graft.  Ultimately he feels strongly about not getting any contrast dye as he states he had a cardiac arrest even after getting premedicated.  Past Medical History:  Diagnosis Date   Anemia of chronic disease    Arthritis    CAD (coronary artery disease)    a.  Myoview 4/11: EF 53%, no scar or ischemia   c. MV 2012 Nl perfusion, apical thinning.  No ischemia or scar.  EF 49%, appears greater by visual estimate.;  d.  Dob stress echo 12/13:  Negative Dob stress echo. There is no evidence of ischemia.  The LVF is normal. b. Normal cors 2016.   Carotid stenosis    a. <93% RICA, >23% LICA by duplex 08/5730   Chronic chest pain    occ   Constipation    chronic   Dyspnea    occasional with extertion   ESRD (end stage renal disease) on dialysis ([name])    M-W-F- [name]   GERD (gastroesophageal reflux disease)    History of hiatal hernia    History of kidney stones    Passed   HNP (herniated nucleus pulposus), lumbar    HTN (hypertension)    echo 3/10: EF 60%, LAE   Hyperlipidemia    Nephrolithiasis    "passed them all"   Peripheral arterial disease ([name])    a. s/p PTCA  right    Pneumonia yrs ago   Restless legs    Sciatic leg pain    Sleep apnea    does not use cpap   Snores    a. presumed OSA, pt  has refused sleep eval in past.   Type II diabetes mellitus ([name])    no longer on medications, checks blood glucose at home   Urinary frequency    Uses wheelchair    Walker as ambulation aid     Past Surgical History:  Procedure Laterality Date   A/V FISTULAGRAM Left 01/10/2021   Procedure: A/V FISTULAGRAM;  Surgeon: Waynetta Sandy, MD;  Location: [facility];  Service: Cardiovascular;  Laterality: Left;   A/V FISTULAGRAM Left 05/19/2021   Procedure: A/V Fistulagram;  Surgeon: Marty Heck, MD;  Location: [facility];  Service: Cardiovascular;  Laterality: Left;   ABDOMINAL AORTOGRAM W/LOWER EXTREMITY Bilateral 08/21/2019   Procedure: ABDOMINAL AORTOGRAM W/LOWER EXTREMITY;  Surgeon: Waynetta Sandy, MD;  Location: [facility];  Service: Cardiovascular;  Laterality: Bilateral;   AMPUTATION Right 07/25/2019   Procedure: RIGHT 5th RAY AMPUTATION;  Surgeon: Newt Minion, MD;  Location: [facility];  Service: Orthopedics;  Laterality: Right;   AMPUTATION Right 11/19/2019   Procedure: RIGHT TRANSMETATARSAL AMPUTATION;  Surgeon: Newt Minion, MD;  Location: [facility];  Service: Orthopedics;  Laterality: Right;   ANGIOPLASTY / STENTING FEMORAL Left 12/11/2013   dr berry   AV FISTULA PLACEMENT Left 03/19/2014   Procedure: CREATION OF ARTERIOVENOUS (AV) FISTULA  LEFT UPPER ARM;  Surgeon: Mal Misty, MD;  Location: [facility];  Service: Vascular;  Laterality: Left;   AV FISTULA PLACEMENT Left 11/09/2020   Procedure: LEFT ARM ARTERIOVENOUS (AV) FISTULA CREATION;  Surgeon: Waynetta Sandy, MD;  Location: [facility];  Service: Vascular;  Laterality: Left;   AV FISTULA PLACEMENT Left 02/15/2021   Procedure: INSERTION OF LEFT ARM ARTERIOVENOUS GORE-TEX GRAFT;  Surgeon: Waynetta Sandy, MD;  Location: [facility];  Service: Vascular;  Laterality: Left;   BACK SURGERY  01/2018   screws placed    [place] Left 12/14/2020   Procedure: REVISION OF  LEFT ARM SECOND STAGE BASILIC VEIN TRANSPOSITION;  Surgeon: Waynetta Sandy, MD;  Location: [facility];  Service: Vascular;  Laterality: Left;   CARDIAC CATHETERIZATION  2001 and 2010    COLONOSCOPY W/ BIOPSIES AND POLYPECTOMY     COLONOSCOPY WITH PROPOFOL N/A 08/01/2016   Procedure: COLONOSCOPY WITH PROPOFOL;  Surgeon: Carol Ada, MD;  Location: [facility];  Service: Endoscopy;  Laterality: N/A;   COLONOSCOPY WITH PROPOFOL N/A 01/17/2022   Procedure: COLONOSCOPY WITH PROPOFOL;  Surgeon: Carol Ada, MD;  Location: [facility];  Service: Gastroenterology;  Laterality: N/A;   CORONARY ARTERY BYPASS GRAFT  2019   [facility] x [facility] bypass   ESOPHAGOGASTRODUODENOSCOPY (EGD) WITH PROPOFOL N/A 08/01/2016   Procedure: ESOPHAGOGASTRODUODENOSCOPY (EGD) WITH PROPOFOL;  Surgeon: Carol Ada, MD;  Location: [facility];  Service: Endoscopy;  Laterality: N/A;   FISTULA SUPERFICIALIZATION Left 02/10/2020   Procedure: 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;  Surgeon: Rosetta Posner, MD;  Location: [facility];  Service: Vascular;  Laterality: Left;   FISTULA SUPERFICIALIZATION Left 03/23/2020   Procedure: LEFT UPPER EXTREMITY ARTERIOVENOUS FISTULA PLICATION;  Surgeon: Rosetta Posner, MD;  Location: [facility];  Service: Vascular;  Laterality: Left;   FOOT FRACTURE SURGERY Right    ligament repair   FRACTURE SURGERY     left forearm   GRAFT APPLICATION Right 0/12/3816   Procedure: FAT GRAFT APPLICATION;  Surgeon: Evelina Bucy, DPM;  Location: [facility];  Service: Podiatry;  Laterality: Right;   HEMATOMA EVACUATION Left 11/09/2020   Procedure: EVACUATION HEMATOMA LEFT ARM;  Surgeon: Angelia Mould, MD;  Location: [facility];  Service: Vascular;  Laterality: Left;   INGUINAL HERNIA REPAIR Left    IR AV DIALY SHUNT INTRO NEEDLE/INTRACATH INITIAL W/PTA/IMG LEFT  11/15/2021   IR FLUORO GUIDE CV LINE RIGHT  01/20/2022   IR REMOVAL TUN CV CATH W/O FL  06/16/2021   IR US GUIDE VASC  ACCESS LEFT  11/15/2021   IR US GUIDE VASC ACCESS LEFT  11/15/2021   IR US GUIDE VASC ACCESS RIGHT  11/15/2021   IR US GUIDE VASC ACCESS RIGHT  01/20/2022   LEFT HEART CATH AND CORS/GRAFTS ANGIOGRAPHY N/A 04/27/2017   Procedure: LEFT HEART CATH AND CORS/GRAFTS ANGIOGRAPHY;  Surgeon: Leonie Man, MD;  Location: [facility];  Service: Cardiovascular;  Laterality: N/A;   LEFT HEART CATHETERIZATION WITH CORONARY ANGIOGRAM N/A 06/22/2014   Procedure: LEFT HEART CATHETERIZATION WITH CORONARY ANGIOGRAM;  Surgeon: Troy Sine, MD;  Location: [facility];  Service: Cardiovascular;  Laterality: N/A;   LOWER EXTREMITY ANGIOGRAM Left 12/11/2013   Procedure: LOWER EXTREMITY ANGIOGRAM;  Surgeon: Lorretta Harp, MD;  Location: [facility] CATH  LAB;  Service: Cardiovascular;  Laterality: Left;   LUMBAR LAMINECTOMY/DECOMPRESSION MICRODISCECTOMY Right 07/03/2017   Procedure: MICRODISCECTOMY LUMBAR FIVE - SACRAL ONE RIGHT;  Surgeon: Consuella Lose, MD;  Location: [place];  Service: Neurosurgery;  Laterality: Right;   LUMBAR LAMINECTOMY/DECOMPRESSION MICRODISCECTOMY Right 10/19/2017   Procedure: MICRODISCECTOMY LUMBAR FIVE- SACRAL 1 ONE ;  Surgeon: Consuella Lose, MD;  Location: [place];  Service: Neurosurgery;  Laterality: Right;   PERIPHERAL VASCULAR BALLOON ANGIOPLASTY Left 05/19/2021   Procedure: PERIPHERAL VASCULAR BALLOON ANGIOPLASTY;  Surgeon: Marty Heck, MD;  Location: [place] [facility];  Service: Cardiovascular;  Laterality: Left;   PERIPHERAL VASCULAR INTERVENTION Left 01/10/2021   Procedure: PERIPHERAL VASCULAR INTERVENTION;  Surgeon: Waynetta Sandy, MD;  Location: [facility];  Service: Cardiovascular;  Laterality: Left;   POLYPECTOMY  01/17/2022   Procedure: POLYPECTOMY;  Surgeon: Carol Ada, MD;  Location: [facility];  Service: Gastroenterology;;   THROMBECTOMY W/ EMBOLECTOMY Left 11/09/2020   Procedure: THROMBECTOMY OF LEFT ARTERIOVENOUS FISTULA;  Surgeon: Angelia Mould, MD;  Location: [place];  Service: Vascular;  Laterality: Left;   TONSILLECTOMY AND ADENOIDECTOMY     WISDOM TOOTH EXTRACTION     WOUND DEBRIDEMENT Right 05/13/2019   Procedure: [place];  Surgeon: Evelina Bucy, DPM;  Location: [facility];  Service: Podiatry;  Laterality: Right;    Family History  Problem Relation Age of Onset   Heart attack Sister        died @ 34   Cancer Mother        died @ 63; unknown type   Diabetes Brother        deceased   Cirrhosis Father        alcohol related   Diabetes Father    Esophageal cancer Neg Hx    Colon cancer Neg Hx    Pancreatic cancer Neg Hx    Stomach cancer Neg Hx     SOCIAL HISTORY: Social History   Socioeconomic History   Marital status: Married    Spouse name: Not on file   Number of children: Not on file   Years of education: Not on file   Highest education level: Not on file  Occupational History   Occupation: retired  Tobacco Use   Smoking status: Former    Packs/day: 1.00    Years: 2.00    Total pack years: 2.00    Types: Cigarettes    Quit date: 1978    Years since quitting: 45.8   Smokeless tobacco: Never  Vaping Use   Vaping Use: Never used  Substance and Sexual Activity   Alcohol use: Not Currently    Alcohol/week: 0.0 standard drinks of alcohol    Comment: h/o social drinking   Drug use: Not Currently    Types: Marijuana    Comment: quit 1986   Sexual activity: Yes    Birth control/protection: None  Other Topics Concern   Not on file  Social History Narrative   The patient is a Retail buyer.  He is married and has 2 grown children.  Lives in [place] with his wife.  Denies tobacco, alcohol or IV drug abuse or marijuana or cocaine intake.    Social Determinants of Health   Financial Resource Strain: Not on file  Food Insecurity: Not on file  Transportation Needs: Not on file  Physical Activity: Not on file  Stress: Not on file  Social Connections: Not on file   Intimate Partner Violence: Not on file  Allergies  Allergen Reactions   Contrast Media [Iodinated Contrast Media] Anaphylaxis    Cardiac arrest, Throat swelling, hives, SOB   Kiwi Extract Itching, Swelling and Other (See Comments)    Lips and face swell- breathing not affected   Flexeril [Cyclobenzaprine]     Hands become flimsy, can not hold things   Tape Other (See Comments)    "Plastic" tape causes blisters!!    Current Outpatient Medications  Medication Sig Dispense Refill   acetaminophen (TYLENOL) 500 MG tablet Take 1,000 mg by mouth every 6 (six) hours as needed for moderate pain.     aspirin EC 81 MG tablet Take 1 tablet (81 mg total) by mouth daily. (Patient taking differently: Take 81 mg by mouth at bedtime.)     atorvastatin (LIPITOR) 40 MG tablet Take 1 tablet (40 mg total) by mouth daily at 6 PM. 30 tablet 5   benzonatate (TESSALON) 100 MG capsule Take 1 capsule (100 mg total) by mouth every 8 (eight) hours. 21 capsule 0   CALCITRIOL PO Take 1 tablet by mouth every Monday, Wednesday, and Friday.     calcium acetate (PHOSLO) 667 MG capsule Take 2 capsules (1,334 mg total) by mouth 2 (two) times daily with a meal. 180 capsule 0   clopidogrel (PLAVIX) 75 MG tablet Take 75 mg by mouth daily.     Etelcalcetide HCl (PARSABIV IV) Etelcalcetide (Parsabiv)     gabapentin (NEURONTIN) 100 MG capsule Take 100-200 mg by mouth See admin instructions. 100 mg in the morning, 200 mg in the evening     HYDROcodone bit-homatropine (HYCODAN) 5-1.5 MG/5ML syrup Take 5 mLs by mouth daily as needed for pain.     loperamide (IMODIUM) 2 MG capsule Take 2 mg by mouth as needed for diarrhea or loose stools.     midodrine (PROAMATINE) 10 MG tablet Take 10 mg by mouth 2 (two) times daily as needed.     multivitamin (RENA-VIT) TABS tablet Take 1 tablet by mouth at bedtime. 30 tablet 0   ONETOUCH ULTRA test strip See admin instructions.     pantoprazole (PROTONIX) 40 MG tablet Take 40 mg by mouth  daily.     senna-docusate (SENOKOT-S) 8.6-50 MG tablet Take 2 tablets by mouth 2 (two) times daily as needed for mild constipation.     predniSONE (DELTASONE) 50 MG tablet Take one tablet at 13 hours, 7 hours and 1 hour prior to your procedure. (Patient not taking: Reported on 02/07/2022) 3 tablet 0   No current facility-administered medications for this visit.   Facility-Administered Medications Ordered in Other Visits  Medication Dose Route Frequency Provider Last Rate Last Admin   ceFAZolin (ANCEF) IVPB 2g/100 mL premix  2 g Intravenous Once Jacqualine Mau, NP        REVIEW OF SYSTEMS:  '[X]'$  denotes positive finding, '[ ]'$  denotes negative finding Cardiac  Comments:  Chest pain or chest pressure:    Shortness of breath upon exertion:    Short of breath when lying flat:    Irregular heart rhythm:        Vascular    Pain in calf, thigh, or hip brought on by ambulation:    Pain in feet at night that wakes you up from your sleep:     Blood clot in your veins:    Leg swelling:         Pulmonary    Oxygen at home:    Productive cough:     Wheezing:  Neurologic    Sudden weakness in arms or legs:     Sudden numbness in arms or legs:     Sudden onset of difficulty speaking or slurred speech:    Temporary loss of vision in one eye:     Problems with dizziness:         Gastrointestinal    Blood in stool:     Vomited blood:         Genitourinary    Burning when urinating:     Blood in urine:        Psychiatric    Major depression:         Hematologic    Bleeding problems:    Problems with blood clotting too easily:        Skin    Rashes or ulcers:        Constitutional    Fever or chills:      PHYSICAL EXAM: Vitals:   02/07/22 1545  BP: 102/60  Pulse: 70  Resp: 16  Temp: (!) 97.2 F (36.2 C)  TempSrc: Temporal  SpO2: 95%  Weight: 176 lb (79.8 kg)  Height: '5\' 9"'$  (1.753 m)    GENERAL: The patient is a well-nourished male, in no acute distress.  The vital signs are documented above. CARDIAC: There is a regular rate and rhythm.  VASCULAR:  Left arm AV graft with no thrill Right IJ tunneled dialysis catheter Right radial and brachial pulse palpable PULMONARY: No respiratory distress.. ABDOMEN: Soft and non-tender.  MUSCULOSKELETAL: There are no major deformities or cyanosis. NEUROLOGIC: No focal weakness or paresthesias are detected. SKIN: There are no ulcers or rashes noted. PSYCHIATRIC: The patient has a normal affect.  DATA:   Right arm vein mapping shows usable basilic vein from 05/19/54  Assessment/Plan:  71 year-old male with end-stage renal disease on hemodialysis Monday Wednesday Friday that presents for evaluation of clotted left arm AV graft.  The referral states evaluate for declot.  The patient states the graft stopped working about 2 to 3 weeks ago.  I discussed that any declot would require fistulogram to evaluate for any technical issues that would explain why the graft failed.  He is strongly against any contrast dye since he had a cardiac arrest in the past even after premedication.  I discussed alternative option would be going to his right arm where he had a previous basilic vein on vein mapping.  We will schedule for right arm AV fistula versus graft.  Discussed if basilic vein would be in 2 stages.  We will evaluate the cephalic vein as well.  If no usable vein will require AV graft.  Risks benefits discussed.   Marty Heck, MD [facility] Office: [phone]

## 2022-02-07 NOTE — Progress Notes (Signed)
Patient name: Jon Anderson MRN: [id number] DOB: 02/13/1951 Sex: male  REASON FOR CONSULT: Evaluate clotted left arm AV graft  HPI: Jon Anderson is a 71 y.o. male, with end-stage renal disease on dialysis Monday Wednesday Friday who is referred for evaluation of clotted left arm AV graft.  States his left arm graft clotted about 2 to 3 weeks ago.  He has been using a right IJ tunneled catheter for the last 2 to 3 weeks.  He initially had a left brachiocephalic AV fistula and then a left basilic vein fistula by Dr. Donzetta Matters on 11/09/2020.  This was ultimately converted to interposition cephalic vein and then most recently on 02/15/2021 he had an left upper extremity revision with AV graft.  Ultimately he feels strongly about not getting any contrast dye as he states he had a cardiac arrest even after getting premedicated.  Past Medical History:  Diagnosis Date   Anemia of chronic disease    Arthritis    CAD (coronary artery disease)    a.  Myoview 4/11: EF 53%, no scar or ischemia   c. MV 2012 Nl perfusion, apical thinning.  No ischemia or scar.  EF 49%, appears greater by visual estimate.;  d.  Dob stress echo 12/13:  Negative Dob stress echo. There is no evidence of ischemia.  The LVF is normal. b. Normal cors 2016.   Carotid stenosis    a. <95% RICA, >28% LICA by duplex 07/1322   Chronic chest pain    occ   Constipation    chronic   Dyspnea    occasional with extertion   ESRD (end stage renal disease) on dialysis ([place])    M-W-F- [name]   GERD (gastroesophageal reflux disease)    History of hiatal hernia    History of kidney stones    Passed   HNP (herniated nucleus pulposus), lumbar    HTN (hypertension)    echo 3/10: EF 60%, LAE   Hyperlipidemia    Nephrolithiasis    "passed them all"   Peripheral arterial disease ([place])    a. s/p PTCA  right    Pneumonia yrs ago   Restless legs    Sciatic leg pain    Sleep apnea    does not use cpap   Snores    a. presumed OSA, pt  has refused sleep eval in past.   Type II diabetes mellitus ([place])    no longer on medications, checks blood glucose at home   Urinary frequency    Uses wheelchair    Walker as ambulation aid     Past Surgical History:  Procedure Laterality Date   A/V FISTULAGRAM Left 01/10/2021   Procedure: A/V FISTULAGRAM;  Surgeon: Waynetta Sandy, MD;  Location: [facility];  Service: Cardiovascular;  Laterality: Left;   A/V FISTULAGRAM Left 05/19/2021   Procedure: A/V Fistulagram;  Surgeon: Marty Heck, MD;  Location: [facility];  Service: Cardiovascular;  Laterality: Left;   ABDOMINAL AORTOGRAM W/LOWER EXTREMITY Bilateral 08/21/2019   Procedure: ABDOMINAL AORTOGRAM W/LOWER EXTREMITY;  Surgeon: Waynetta Sandy, MD;  Location: [facility];  Service: Cardiovascular;  Laterality: Bilateral;   AMPUTATION Right 07/25/2019   Procedure: RIGHT 5th RAY AMPUTATION;  Surgeon: Newt Minion, MD;  Location: [facility];  Service: Orthopedics;  Laterality: Right;   AMPUTATION Right 11/19/2019   Procedure: RIGHT TRANSMETATARSAL AMPUTATION;  Surgeon: Newt Minion, MD;  Location: [facility];  Service: Orthopedics;  Laterality: Right;   ANGIOPLASTY / STENTING FEMORAL Left 12/11/2013   dr berry   AV FISTULA PLACEMENT Left 03/19/2014   Procedure: CREATION OF ARTERIOVENOUS (AV) FISTULA  LEFT UPPER ARM;  Surgeon: Mal Misty, MD;  Location: [facility];  Service: Vascular;  Laterality: Left;   AV FISTULA PLACEMENT Left 11/09/2020   Procedure: LEFT ARM ARTERIOVENOUS (AV) FISTULA CREATION;  Surgeon: Waynetta Sandy, MD;  Location: [facility];  Service: Vascular;  Laterality: Left;   AV FISTULA PLACEMENT Left 02/15/2021   Procedure: INSERTION OF LEFT ARM ARTERIOVENOUS GORE-TEX GRAFT;  Surgeon: Waynetta Sandy, MD;  Location: [facility];  Service: Vascular;  Laterality: Left;   BACK SURGERY  01/2018   screws placed    [place] Left 12/14/2020   Procedure: REVISION OF  LEFT ARM SECOND STAGE BASILIC VEIN TRANSPOSITION;  Surgeon: Waynetta Sandy, MD;  Location: [facility];  Service: Vascular;  Laterality: Left;   CARDIAC CATHETERIZATION  2001 and 2010    COLONOSCOPY W/ BIOPSIES AND POLYPECTOMY     COLONOSCOPY WITH PROPOFOL N/A 08/01/2016   Procedure: COLONOSCOPY WITH PROPOFOL;  Surgeon: Carol Ada, MD;  Location: [facility];  Service: Endoscopy;  Laterality: N/A;   COLONOSCOPY WITH PROPOFOL N/A 01/17/2022   Procedure: COLONOSCOPY WITH PROPOFOL;  Surgeon: Carol Ada, MD;  Location: [facility];  Service: Gastroenterology;  Laterality: N/A;   CORONARY ARTERY BYPASS GRAFT  2019   [facility] x [facility] bypass   ESOPHAGOGASTRODUODENOSCOPY (EGD) WITH PROPOFOL N/A 08/01/2016   Procedure: ESOPHAGOGASTRODUODENOSCOPY (EGD) WITH PROPOFOL;  Surgeon: Carol Ada, MD;  [facility];  Service: Endoscopy;  Laterality: N/A;   FISTULA SUPERFICIALIZATION Left 02/10/2020   Procedure: 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;  Surgeon: Rosetta Posner, MD;  Location: [facility];  Service: Vascular;  Laterality: Left;   FISTULA SUPERFICIALIZATION Left 03/23/2020   Procedure: LEFT UPPER EXTREMITY ARTERIOVENOUS FISTULA PLICATION;  Surgeon: Rosetta Posner, MD;  Location: [facility];  Service: Vascular;  Laterality: Left;   FOOT FRACTURE SURGERY Right    ligament repair   FRACTURE SURGERY     left forearm   GRAFT APPLICATION Right 0/09/3014   Procedure: FAT GRAFT APPLICATION;  Surgeon: Evelina Bucy, DPM;  Location: [facility];  Service: Podiatry;  Laterality: Right;   HEMATOMA EVACUATION Left 11/09/2020   Procedure: EVACUATION HEMATOMA LEFT ARM;  Surgeon: Angelia Mould, MD;  [facility];  Service: Vascular;  Laterality: Left;   INGUINAL HERNIA REPAIR Left    IR AV DIALY SHUNT INTRO NEEDLE/INTRACATH INITIAL W/PTA/IMG LEFT  11/15/2021   IR FLUORO GUIDE CV LINE RIGHT  01/20/2022   IR REMOVAL TUN CV CATH W/O FL  06/16/2021   IR US GUIDE VASC  ACCESS LEFT  11/15/2021   IR US GUIDE VASC ACCESS LEFT  11/15/2021   IR US GUIDE VASC ACCESS RIGHT  11/15/2021   IR US GUIDE VASC ACCESS RIGHT  01/20/2022   LEFT HEART CATH AND CORS/GRAFTS ANGIOGRAPHY N/A 04/27/2017   Procedure: LEFT HEART CATH AND CORS/GRAFTS ANGIOGRAPHY;  Surgeon: Leonie Man, MD;  Location: [place] [facility];  Service: Cardiovascular;  Laterality: N/A;   LEFT HEART CATHETERIZATION WITH CORONARY ANGIOGRAM N/A 06/22/2014   Procedure: LEFT HEART CATHETERIZATION WITH CORONARY ANGIOGRAM;  Surgeon: Troy Sine, MD;  Location: [facility];  Service: Cardiovascular;  Laterality: N/A;   LOWER EXTREMITY ANGIOGRAM Left 12/11/2013   Procedure: LOWER EXTREMITY ANGIOGRAM;  Surgeon: Lorretta Harp, MD;  Location: [facility]  LAB;  Service: Cardiovascular;  Laterality: Left;   LUMBAR LAMINECTOMY/DECOMPRESSION MICRODISCECTOMY Right 07/03/2017   Procedure: MICRODISCECTOMY LUMBAR FIVE - SACRAL ONE RIGHT;  Surgeon: Consuella Lose, MD;  Location: [place];  Service: Neurosurgery;  Laterality: Right;   LUMBAR LAMINECTOMY/DECOMPRESSION MICRODISCECTOMY Right 10/19/2017   Procedure: MICRODISCECTOMY LUMBAR FIVE- SACRAL 1 ONE ;  Surgeon: Consuella Lose, MD;  Location: [facility];  Service: Neurosurgery;  Laterality: Right;   PERIPHERAL VASCULAR BALLOON ANGIOPLASTY Left 05/19/2021   Procedure: PERIPHERAL VASCULAR BALLOON ANGIOPLASTY;  Surgeon: Marty Heck, MD;  Location: [facility];  Service: Cardiovascular;  Laterality: Left;   PERIPHERAL VASCULAR INTERVENTION Left 01/10/2021   Procedure: PERIPHERAL VASCULAR INTERVENTION;  Surgeon: Waynetta Sandy, MD;  Location: [facility];  Service: Cardiovascular;  Laterality: Left;   POLYPECTOMY  01/17/2022   Procedure: POLYPECTOMY;  Surgeon: Carol Ada, MD;  Location: [facility];  Service: Gastroenterology;;   THROMBECTOMY W/ EMBOLECTOMY Left 11/09/2020   Procedure: THROMBECTOMY OF LEFT ARTERIOVENOUS FISTULA;  Surgeon: Angelia Mould, MD;  Location: [facility];  Service: Vascular;  Laterality: Left;   TONSILLECTOMY AND ADENOIDECTOMY     WISDOM TOOTH EXTRACTION     WOUND DEBRIDEMENT Right 05/13/2019   Procedure: [place];  Surgeon: Evelina Bucy, DPM;  Location: [facility];  Service: Podiatry;  Laterality: Right;    Family History  Problem Relation Age of Onset   Heart attack Sister        died @ 86   Cancer Mother        died @ 19; unknown type   Diabetes Brother        deceased   Cirrhosis Father        alcohol related   Diabetes Father    Esophageal cancer Neg Hx    Colon cancer Neg Hx    Pancreatic cancer Neg Hx    Stomach cancer Neg Hx     SOCIAL HISTORY: Social History   Socioeconomic History   Marital status: Married    Spouse name: Not on file   Number of children: Not on file   Years of education: Not on file   Highest education level: Not on file  Occupational History   Occupation: retired  Tobacco Use   Smoking status: Former    Packs/day: 1.00    Years: 2.00    Total pack years: 2.00    Types: Cigarettes    Quit date: 1978    Years since quitting: 45.8   Smokeless tobacco: Never  Vaping Use   Vaping Use: Never used  Substance and Sexual Activity   Alcohol use: Not Currently    Alcohol/week: 0.0 standard drinks of alcohol    Comment: h/o social drinking   Drug use: Not Currently    Types: Marijuana    Comment: quit 1986   Sexual activity: Yes    Birth control/protection: None  Other Topics Concern   Not on file  Social History Narrative   The patient is a Retail buyer.  He is married and has 2 grown children.  Lives in [place] with his wife.  Denies tobacco, alcohol or IV drug abuse or marijuana or cocaine intake.    Social Determinants of Health   Financial Resource Strain: Not on file  Food Insecurity: Not on file  Transportation Needs: Not on file  Physical Activity: Not on file  Stress: Not on file  Social Connections: Not on file   Intimate Partner Violence: Not on file  Allergies  Allergen Reactions   Contrast Media [Iodinated Contrast Media] Anaphylaxis    Cardiac arrest, Throat swelling, hives, SOB   Kiwi Extract Itching, Swelling and Other (See Comments)    Lips and face swell- breathing not affected   Flexeril [Cyclobenzaprine]     Hands become flimsy, can not hold things   Tape Other (See Comments)    "Plastic" tape causes blisters!!    Current Outpatient Medications  Medication Sig Dispense Refill   acetaminophen (TYLENOL) 500 MG tablet Take 1,000 mg by mouth every 6 (six) hours as needed for moderate pain.     aspirin EC 81 MG tablet Take 1 tablet (81 mg total) by mouth daily. (Patient taking differently: Take 81 mg by mouth at bedtime.)     atorvastatin (LIPITOR) 40 MG tablet Take 1 tablet (40 mg total) by mouth daily at 6 PM. 30 tablet 5   benzonatate (TESSALON) 100 MG capsule Take 1 capsule (100 mg total) by mouth every 8 (eight) hours. 21 capsule 0   CALCITRIOL PO Take 1 tablet by mouth every Monday, Wednesday, and Friday.     calcium acetate (PHOSLO) 667 MG capsule Take 2 capsules (1,334 mg total) by mouth 2 (two) times daily with a meal. 180 capsule 0   clopidogrel (PLAVIX) 75 MG tablet Take 75 mg by mouth daily.     Etelcalcetide HCl (PARSABIV IV) Etelcalcetide (Parsabiv)     gabapentin (NEURONTIN) 100 MG capsule Take 100-200 mg by mouth See admin instructions. 100 mg in the morning, 200 mg in the evening     HYDROcodone bit-homatropine (HYCODAN) 5-1.5 MG/5ML syrup Take 5 mLs by mouth daily as needed for pain.     loperamide (IMODIUM) 2 MG capsule Take 2 mg by mouth as needed for diarrhea or loose stools.     midodrine (PROAMATINE) 10 MG tablet Take 10 mg by mouth 2 (two) times daily as needed.     multivitamin (RENA-VIT) TABS tablet Take 1 tablet by mouth at bedtime. 30 tablet 0   ONETOUCH ULTRA test strip See admin instructions.     pantoprazole (PROTONIX) 40 MG tablet Take 40 mg by mouth  daily.     senna-docusate (SENOKOT-S) 8.6-50 MG tablet Take 2 tablets by mouth 2 (two) times daily as needed for mild constipation.     predniSONE (DELTASONE) 50 MG tablet Take one tablet at 13 hours, 7 hours and 1 hour prior to your procedure. (Patient not taking: Reported on 02/07/2022) 3 tablet 0   No current facility-administered medications for this visit.   Facility-Administered Medications Ordered in Other Visits  Medication Dose Route Frequency Provider Last Rate Last Admin   ceFAZolin (ANCEF) IVPB 2g/100 mL premix  2 g Intravenous Once Jacqualine Mau, NP        REVIEW OF SYSTEMS:  '[X]'$  denotes positive finding, '[ ]'$  denotes negative finding Cardiac  Comments:  Chest pain or chest pressure:    Shortness of breath upon exertion:    Short of breath when lying flat:    Irregular heart rhythm:        Vascular    Pain in calf, thigh, or hip brought on by ambulation:    Pain in feet at night that wakes you up from your sleep:     Blood clot in your veins:    Leg swelling:         Pulmonary    Oxygen at home:    Productive cough:     Wheezing:  Neurologic    Sudden weakness in arms or legs:     Sudden numbness in arms or legs:     Sudden onset of difficulty speaking or slurred speech:    Temporary loss of vision in one eye:     Problems with dizziness:         Gastrointestinal    Blood in stool:     Vomited blood:         Genitourinary    Burning when urinating:     Blood in urine:        Psychiatric    Major depression:         Hematologic    Bleeding problems:    Problems with blood clotting too easily:        Skin    Rashes or ulcers:        Constitutional    Fever or chills:      PHYSICAL EXAM: Vitals:   02/07/22 1545  BP: 102/60  Pulse: 70  Resp: 16  Temp: (!) 97.2 F (36.2 C)  TempSrc: Temporal  SpO2: 95%  Weight: 176 lb (79.8 kg)  Height: '5\' 9"'$  (1.753 m)    GENERAL: The patient is a well-nourished male, in no acute distress.  The vital signs are documented above. CARDIAC: There is a regular rate and rhythm.  VASCULAR:  Left arm AV graft with no thrill Right IJ tunneled dialysis catheter Right radial and brachial pulse palpable PULMONARY: No respiratory distress.. ABDOMEN: Soft and non-tender.  MUSCULOSKELETAL: There are no major deformities or cyanosis. NEUROLOGIC: No focal weakness or paresthesias are detected. SKIN: There are no ulcers or rashes noted. PSYCHIATRIC: The patient has a normal affect.  DATA:   Right arm vein mapping shows usable basilic vein from 05/18/23  Assessment/Plan:  71 year-old male with end-stage renal disease on hemodialysis Monday Wednesday Friday that presents for evaluation of clotted left arm AV graft.  The referral states evaluate for declot.  The patient states the graft stopped working about 2 to 3 weeks ago.  I discussed that any declot would require fistulogram to evaluate for any technical issues that would explain why the graft failed.  He is strongly against any contrast dye since he had a cardiac arrest in the past even after premedication.  I discussed alternative option would be going to his right arm where he had a previous basilic vein on vein mapping.  We will schedule for right arm AV fistula versus graft.  Discussed if basilic vein would be in 2 stages.  We will evaluate the cephalic vein as well.  If no usable vein will require AV graft.  Risks benefits discussed.   Marty Heck, MD [facility] Office: [phone]

## 2022-02-09 ENCOUNTER — Other Ambulatory Visit: Payer: Self-pay

## 2022-02-09 DIAGNOSIS — N186 End stage renal disease: Secondary | ICD-10-CM

## 2022-02-15 ENCOUNTER — Other Ambulatory Visit: Payer: Self-pay

## 2022-02-15 ENCOUNTER — Encounter (HOSPITAL_COMMUNITY): Payer: Self-pay | Admitting: Vascular Surgery

## 2022-02-15 NOTE — Progress Notes (Signed)
PCP - Dr. Lake Bells  Cardiologist - Dr. Gwenlyn Found  EP- Denies  Endocrine- Denies  Pulm- Denies  Chest x-ray - 01/20/22 (E)  EKG - 11/15/21 (E)  Stress Test - Denies  ECHO - 11/15/21 (E)  Cardiac Cath - 04/27/17 (E)  AICD-na PM-na LOOP-na  Nerve Stimulator- Denies  Dialysis- M-W-F, pt states he had a full session today 11/8  Sleep Study - Yes- Positive CPAP - Denies  LABS- 02/16/22: I-Stat 8  ASA- Denies PLAVIX- LD- 11/2  ERAS- No  HA1C- 11/15/21(E): 7.6 Fasting Blood Sugar - 139-175 Checks Blood Sugar __1-2___ times a day  Anesthesia- No  Pt denies having chest pain, sob, or fever during the pre-op phone call. All instructions explained to the pt, with a verbal understanding of the material. Pt also instructed to wear a mask and social distance if he goes out. The opportunity to ask questions was provided.

## 2022-02-15 NOTE — Progress Notes (Signed)
S.D.W- Instructions   Your procedure is scheduled on Thurs., Nov. 9, 2023 from 9:06AM-10:15AM.  Report to [facility] Main Entrance "A" at 6:30 A.M., then check in with the Admitting office.  Call this number if you have problems the morning of surgery:  [phone]   Remember:  Do not eat or drink after midnight ton Nov. 8th    Take these medicines the morning of surgery with A SIP OF WATER: Gabapentin (NEURONTIN)  Pantoprazole (PROTONIX)   If Needed: Midodrine (PROAMATINE)  Loperamide (IMODIUM)  Acetaminophen (TYLENOL)   Hold Plavix for 5 days prior to your surgery.  As of today, STOP taking any Aspirin (unless otherwise instructed by your surgeon) Aleve, Naproxen, Ibuprofen, Motrin, Advil, Goody's, BC's, all herbal medications, fish oil, and all vitamins.   How to Manage Your Diabetes Before and After Surgery  How do I manage my blood sugar before surgery? Check your blood sugar the morning of your surgery when you wake up and every 2 hours until you get to the Short Stay unit. If your blood sugar is less than 70 mg/dL, you will need to treat for low blood sugar: Do not take insulin. Treat a low blood sugar (less than 70 mg/dL) with  cup of clear juice (cranberry or apple), 4 glucose tablets, OR glucose gel. Recheck blood sugar in 15 minutes after treatment (to make sure it is greater than 70 mg/dL). If your blood sugar is not greater than 70 mg/dL on recheck, call [phone]  for further instructions. Report your blood sugar to the short stay nurse when you get to Short Stay.  If your CBG is greater than 220 mg/dL, inform the staff upon arrival to Short stay.  Reviewed and Endorsed by [facility] Patient Education Committee, August 2015  Do not wear jewelry. Do not wear lotions, powders, cologne or deodorant. Do not shave 48 hours prior to surgery.  Men may shave face and neck. Do not bring valuables to the hospital.  [facility] is not responsible for any  belongings or valuables.    Do NOT Smoke (Tobacco/Vaping)  24 hours prior to your procedure  If you use a CPAP at night, you may bring your mask for your overnight stay.   Contacts, glasses, hearing aids, dentures or partials may not be worn into surgery, please bring cases for these belongings   For patients admitted to the hospital, discharge time will be determined by your treatment team.   Patients discharged the day of surgery will not be allowed to drive home, and someone needs to stay with them for 24 hours.  Special instructions:    Oral Hygiene is also important to reduce your risk of infection.  Remember - BRUSH YOUR TEETH THE MORNING OF SURGERY WITH YOUR REGULAR TOOTHPASTE  [facility]- Preparing For Surgery  Before surgery, you can play an important role. Because skin is not sterile, your skin needs to be as free of germs as possible. You can reduce the number of germs on your skin by washing with Antibacterial Soap before surgery.     Please follow these instructions carefully.     Shower the NIGHT BEFORE SURGERY and the MORNING OF SURGERY with Antibacterial Soap.   Pat yourself dry with a CLEAN TOWEL.  Wear CLEAN PAJAMAS to bed the night before surgery  Place CLEAN SHEETS on your bed the night before your surgery  DO NOT SLEEP WITH PETS.  Day of Surgery:  Take a shower with Antibacterial soap. Wear Clean/Comfortable  clothing the morning of surgery Do not apply any deodorants/lotions.   Remember to brush your teeth WITH YOUR REGULAR TOOTHPASTE.   If you test positive for Covid, or been in contact with anyone that has tested positive in the last 10 days, please notify your surgeon.  SURGICAL WAITING ROOM VISITATION Patients having surgery or a procedure may have no more than 2 support people in the waiting area - these visitors may rotate.   Children under the age of 55 must have an adult with them who is not the patient. If the patient needs to stay at the  hospital during part of their recovery, the visitor guidelines for inpatient rooms apply. Pre-op nurse will coordinate an appropriate time for 1 support person to accompany patient in pre-op.  This support person may not rotate.   Please refer to the [facility] website for the visitor guidelines for Inpatients (after your surgery is over and you are in a regular room).

## 2022-02-16 ENCOUNTER — Ambulatory Visit (HOSPITAL_BASED_OUTPATIENT_CLINIC_OR_DEPARTMENT_OTHER): Payer: Medicare Other | Admitting: Anesthesiology

## 2022-02-16 ENCOUNTER — Encounter (HOSPITAL_COMMUNITY)
Admission: RE | Disposition: A | Discharge status: Home / Self Care | Payer: Self-pay | Source: Home / Self Care | Attending: Vascular Surgery

## 2022-02-16 ENCOUNTER — Other Ambulatory Visit: Payer: Self-pay

## 2022-02-16 ENCOUNTER — Ambulatory Visit (HOSPITAL_COMMUNITY)
Admission: RE | Admit: 2022-02-16 | Discharge: 2022-02-16 | Disposition: A | Discharge status: Home / Self Care | Payer: Medicare Other | Attending: Vascular Surgery | Admitting: Vascular Surgery

## 2022-02-16 ENCOUNTER — Ambulatory Visit (HOSPITAL_COMMUNITY): Payer: Medicare Other | Admitting: Anesthesiology

## 2022-02-16 ENCOUNTER — Encounter (HOSPITAL_COMMUNITY): Payer: Self-pay | Admitting: Vascular Surgery

## 2022-02-16 DIAGNOSIS — Z87891 Personal history of nicotine dependence: Secondary | ICD-10-CM

## 2022-02-16 DIAGNOSIS — E1151 Type 2 diabetes mellitus with diabetic peripheral angiopathy without gangrene: Secondary | ICD-10-CM | POA: Diagnosis not present

## 2022-02-16 DIAGNOSIS — N186 End stage renal disease: Secondary | ICD-10-CM | POA: Diagnosis not present

## 2022-02-16 DIAGNOSIS — I132 Hypertensive heart and chronic kidney disease with heart failure and with stage 5 chronic kidney disease, or end stage renal disease: Secondary | ICD-10-CM

## 2022-02-16 DIAGNOSIS — N185 Chronic kidney disease, stage 5: Secondary | ICD-10-CM | POA: Diagnosis not present

## 2022-02-16 DIAGNOSIS — Z992 Dependence on renal dialysis: Secondary | ICD-10-CM | POA: Diagnosis not present

## 2022-02-16 DIAGNOSIS — E1122 Type 2 diabetes mellitus with diabetic chronic kidney disease: Secondary | ICD-10-CM

## 2022-02-16 DIAGNOSIS — I509 Heart failure, unspecified: Secondary | ICD-10-CM

## 2022-02-16 DIAGNOSIS — I2510 Atherosclerotic heart disease of native coronary artery without angina pectoris: Secondary | ICD-10-CM

## 2022-02-16 HISTORY — PX: AV FISTULA PLACEMENT: SHX1204

## 2022-02-16 LAB — POCT I-STAT, CHEM 8
BUN: 23 mg/dL (ref 8–23)
Calcium, Ion: 1.16 mmol/L (ref 1.15–1.40)
Chloride: 97 mmol/L — ABNORMAL LOW (ref 98–111)
Creatinine, Ser: 7.5 mg/dL — ABNORMAL HIGH (ref 0.61–1.24)
Glucose, Bld: 112 mg/dL — ABNORMAL HIGH (ref 70–99)
HCT: 35 % — ABNORMAL LOW (ref 39.0–52.0)
Hemoglobin: 11.9 g/dL — ABNORMAL LOW (ref 13.0–17.0)
Potassium: 3.6 mmol/L (ref 3.5–5.1)
Sodium: 137 mmol/L (ref 135–145)
TCO2: 30 mmol/L (ref 22–32)

## 2022-02-16 LAB — GLUCOSE, CAPILLARY
Glucose-Capillary: 116 mg/dL — ABNORMAL HIGH (ref 70–99)
Glucose-Capillary: 178 mg/dL — ABNORMAL HIGH (ref 70–99)

## 2022-02-16 SURGERY — ARTERIOVENOUS (AV) FISTULA CREATION
Anesthesia: Monitor Anesthesia Care | Site: Arm Upper | Laterality: Right

## 2022-02-16 MED ORDER — HYDROCODONE-ACETAMINOPHEN 5-325 MG PO TABS: 1.0000 | ORAL_TABLET | Freq: Four times a day (QID) | ORAL | 0 refills | Status: DC | PRN

## 2022-02-16 MED ORDER — ORAL CARE MOUTH RINSE: 15.0000 mL | Freq: Once | OROMUCOSAL | Status: AC

## 2022-02-16 MED ORDER — CHLORHEXIDINE GLUCONATE 0.12 % MT SOLN
15.0000 mL | Freq: Once | OROMUCOSAL | Status: AC
  Administered 2022-02-16: 15 mL via OROMUCOSAL
  Filled 2022-02-16: qty 15

## 2022-02-16 MED ORDER — OXYCODONE HCL 5 MG/5ML PO SOLN: 5.0000 mg | Freq: Once | ORAL | Status: DC | PRN

## 2022-02-16 MED ORDER — PROPOFOL 10 MG/ML IV BOLUS
INTRAVENOUS | Status: DC | PRN
  Administered 2022-02-16: 10 mg via INTRAVENOUS

## 2022-02-16 MED ORDER — HEPARIN 6000 UNIT IRRIGATION SOLUTION
Status: DC | PRN
  Administered 2022-02-16: 1

## 2022-02-16 MED ORDER — MIDAZOLAM HCL 2 MG/2ML IJ SOLN
INTRAMUSCULAR | Status: AC
  Filled 2022-02-16: qty 2

## 2022-02-16 MED ORDER — CHLORHEXIDINE GLUCONATE 4 % EX LIQD: 60.0000 mL | Freq: Once | CUTANEOUS | Status: DC

## 2022-02-16 MED ORDER — PROPOFOL 500 MG/50ML IV EMUL
INTRAVENOUS | Status: DC | PRN
  Administered 2022-02-16: 50 ug/kg/min via INTRAVENOUS

## 2022-02-16 MED ORDER — FENTANYL CITRATE (PF) 250 MCG/5ML IJ SOLN
INTRAMUSCULAR | Status: AC
  Filled 2022-02-16: qty 5

## 2022-02-16 MED ORDER — INSULIN ASPART 100 UNIT/ML IJ SOLN: 0.0000 [IU] | INTRAMUSCULAR | Status: DC | PRN

## 2022-02-16 MED ORDER — OXYCODONE HCL 5 MG PO TABS: 5.0000 mg | ORAL_TABLET | Freq: Once | ORAL | Status: DC | PRN

## 2022-02-16 MED ORDER — CEFAZOLIN SODIUM-DEXTROSE 2-4 GM/100ML-% IV SOLN
2.0000 g | INTRAVENOUS | Status: DC
  Filled 2022-02-16: qty 100

## 2022-02-16 MED ORDER — ACETAMINOPHEN 500 MG PO TABS
1000.0000 mg | ORAL_TABLET | Freq: Once | ORAL | Status: AC
  Administered 2022-02-16: 1000 mg via ORAL
  Filled 2022-02-16: qty 2

## 2022-02-16 MED ORDER — FENTANYL CITRATE (PF) 100 MCG/2ML IJ SOLN
INTRAMUSCULAR | Status: AC
  Administered 2022-02-16: 50 ug via INTRAVENOUS
  Filled 2022-02-16: qty 2

## 2022-02-16 MED ORDER — PHENYLEPHRINE HCL-NACL 20-0.9 MG/250ML-% IV SOLN
INTRAVENOUS | Status: DC | PRN
  Administered 2022-02-16: 25 ug/min via INTRAVENOUS

## 2022-02-16 MED ORDER — LIDOCAINE-EPINEPHRINE (PF) 1.5 %-1:200000 IJ SOLN
INTRAMUSCULAR | Status: DC | PRN
  Administered 2022-02-16: 20 mL via PERINEURAL

## 2022-02-16 MED ORDER — FENTANYL CITRATE (PF) 100 MCG/2ML IJ SOLN: 50.0000 ug | Freq: Once | INTRAMUSCULAR | Status: AC

## 2022-02-16 MED ORDER — MIDAZOLAM HCL 2 MG/2ML IJ SOLN: 1.0000 mg | Freq: Once | INTRAMUSCULAR | Status: AC

## 2022-02-16 MED ORDER — HEPARIN 6000 UNIT IRRIGATION SOLUTION
Status: AC
  Filled 2022-02-16: qty 500

## 2022-02-16 MED ORDER — SODIUM CHLORIDE 0.9 % IV SOLN: INTRAVENOUS | Status: DC

## 2022-02-16 MED ORDER — FENTANYL CITRATE (PF) 100 MCG/2ML IJ SOLN: 25.0000 ug | INTRAMUSCULAR | Status: DC | PRN

## 2022-02-16 MED ORDER — MIDAZOLAM HCL 2 MG/2ML IJ SOLN
INTRAMUSCULAR | Status: AC
  Administered 2022-02-16: 1 mg via INTRAVENOUS
  Filled 2022-02-16: qty 2

## 2022-02-16 MED ORDER — STERILE WATER FOR IRRIGATION IR SOLN
Status: DC | PRN
  Administered 2022-02-16: 1000 mL

## 2022-02-16 MED ORDER — 0.9 % SODIUM CHLORIDE (POUR BTL) OPTIME
TOPICAL | Status: DC | PRN
  Administered 2022-02-16: 1000 mL

## 2022-02-16 MED ORDER — ONDANSETRON HCL 4 MG/2ML IJ SOLN: 4.0000 mg | Freq: Once | INTRAMUSCULAR | Status: DC | PRN

## 2022-02-16 SURGICAL SUPPLY — 32 items

## 2022-02-16 NOTE — Discharge Instructions (Addendum)
[facility]  Discharge Instructions  AV Fistula or Graft Surgery for Dialysis Access  Please refer to the following instructions for your post-procedure care. Your surgeon or physician assistant will discuss any changes with you.  Activity  You may drive the day following your surgery, if you are comfortable and no longer taking prescription pain medication. Resume full activity as the soreness in your incision resolves.  Bathing/Showering  You may shower after you go home. Keep your incision dry for 48 hours. Do not soak in a bathtub, hot tub, or swim until the incision heals completely. You may not shower if you have a hemodialysis catheter.  Incision Care  Clean your incision with mild soap and water after 48 hours. Pat the area dry with a clean towel. You do not need a bandage unless otherwise instructed. Do not apply any ointments or creams to your incision. You may have skin glue on your incision. Do not peel it off. It will come off on its own in about one week. Your arm may swell a bit after surgery. To reduce swelling use pillows to elevate your arm so it is above your heart. Your doctor will tell you if you need to lightly wrap your arm with an ACE bandage.  Diet  Resume your normal diet. There are not special food restrictions following this procedure. In order to heal from your surgery, it is CRITICAL to get adequate nutrition. Your body requires vitamins, minerals, and protein. Vegetables are the best source of vitamins and minerals. Vegetables also provide the perfect balance of protein. Processed food has little nutritional value, so try to avoid this.  Medications  Resume taking all of your medications. If your incision is causing pain, you may take over-the counter pain relievers such as acetaminophen (Tylenol). If you were prescribed a stronger pain medication, please be aware these medications can cause nausea and constipation. Prevent  nausea by taking the medication with a snack or meal. Avoid constipation by drinking plenty of fluids and eating foods with high amount of fiber, such as fruits, vegetables, and grains.  Do not take Tylenol if you are taking prescription pain medications.  Follow up Your surgeon may want to see you in the office following your access surgery. If so, this will be arranged at the time of your surgery.  Please call us immediately for any of the following conditions:  Increased pain, redness, drainage (pus) from your incision site Fever of 101 degrees or higher Severe or worsening pain at your incision site Hand pain or numbness.  Reduce your risk of vascular disease:  Stop smoking. If you would like help, call [facility] at 1-800-QUIT-NOW (770) 725-5974) or [place] at [place] your cholesterol Maintain a desired weight Control your diabetes Keep your blood pressure down  Dialysis  It will take several weeks to several months for your new dialysis access to be ready for use. Your surgeon will determine when it is okay to use it. Your nephrologist will continue to direct your dialysis. You can continue to use your Permcath until your new access is ready for use.   02/16/2022 Jon Anderson [id number] 10/07/1950  Surgeon(s): Waynetta Sandy, MD  Procedure(s): RIGHT ARM BRACHIOCEPAHLIC ARTERIOVENOUS (AV) FISTULA CREATION   May stick graft immediately   May stick graft on designated area only:   X Do not stick right AV fistula for 12 weeks    If you have any questions, please call the  office at [phone].

## 2022-02-16 NOTE — Transfer of Care (Signed)
Immediate Anesthesia Transfer of Care Note  Patient: Jon Anderson  Procedure(s) Performed: RIGHT ARM BRACHIOCEPAHLIC ARTERIOVENOUS (AV) FISTULA CREATION (Right: Arm Upper)  Patient Location: PACU  Anesthesia Type:MAC  Level of Consciousness: awake, alert , and oriented  Airway & Oxygen Therapy: Patient Spontanous Breathing and Patient connected to face mask oxygen  Post-op Assessment: Report given to RN and Post -op Vital signs reviewed and stable  Post vital signs: Reviewed and stable  Last Vitals:  Vitals Value Taken Time  BP 132/66 02/16/22 1033  Temp    Pulse 78 02/16/22 1035  Resp 17 02/16/22 1035  SpO2 100 % 02/16/22 1035  Vitals shown include unvalidated device data.  Last Pain:  Vitals:   02/16/22 0720  PainSc: 0-No pain      Patients Stated Pain Goal: 3 (40/97/35 3299)  Complications: No notable events documented.

## 2022-02-16 NOTE — Anesthesia Postprocedure Evaluation (Signed)
Anesthesia Post Note  Patient: Jon Anderson  Procedure(s) Performed: RIGHT ARM BRACHIOCEPAHLIC ARTERIOVENOUS (AV) FISTULA CREATION (Right: Arm Upper)     Patient location during evaluation: PACU Anesthesia Type: Regional and MAC Level of consciousness: awake and alert Pain management: pain level controlled Vital Signs Assessment: post-procedure vital signs reviewed and stable Respiratory status: spontaneous breathing, nonlabored ventilation and respiratory function stable Cardiovascular status: blood pressure returned to baseline and stable Postop Assessment: no apparent nausea or vomiting Anesthetic complications: no   No notable events documented.  Last Vitals:  Vitals:   02/16/22 1034 02/16/22 1045  BP: 132/66 (!) 112/34  Pulse:  80  Resp: 17 18  Temp: 36.7 C   SpO2:  94%    Last Pain:  Vitals:   02/16/22 1034  PainSc: 0-No pain                 Pervis Hocking

## 2022-02-16 NOTE — Op Note (Signed)
    Patient name: Jon Anderson MRN: [id number] DOB: December 20, 1950 Sex: male  02/16/2022 Pre-operative Diagnosis: esrd Post-operative diagnosis:  Same Surgeon:  Erlene Quan C. Donzetta Matters, MD Assistant: Paulo Fruit, PA Procedure Performed:  Right arm brachial artery to basilic vein fistula creation  Indications: 71 year old male with end-stage renal disease currently dialyzing via catheter.  He has previous left upper extremity access which is now thrombosed and is indicated for right arm access which is his dominant extremity.  Findings: Cephalic vein appeared quite sclerotic throughout the upper arm.  The basilic vein was large at the antecubital and throughout the upper arm and at completion of fistula creation there was a very strong thrill in the fistula and a palpable radial artery pulse at the wrist both confirmed with Doppler.   Procedure:  The patient was identified in the holding area and taken to the operating room was put supine operative table and MAC anesthesia was induced.  A preoperative block of been placed in his right upper extremity.  Antibiotics were minister timeout was called he was sterilely prepped and draped in the right upper extremity.  We began using ultrasound we identified a suitable basilic vein brachial artery just below the antecubital.  A transverse incision was created.  We dissected out the vein for several centimeters divided branches between clips and ties marked for orientation.  We dissected through the deep fascia the brachial artery and encircled this with vessel loop.  The vein was then clamped distally transected flush with heparinized saline spatulated and flushed with heparinized saline and clamped.  We then clamped the artery distally proximally opened longitudinally flushed distally with heparinized saline and sewed the vein and side with 6-0 Prolene suture.  Prior completion of flushing all directions.  Upon completion there was a very strong thrill in the  fistula confirmed with Doppler and a palpable right radial artery pulse also confirmed with Doppler.  We then irrigated the wound obtaining stasis closed in layers with Vicryl and Monocryl.  Dermabond was placed at the skin level.  Patient was awakened from anesthesia having tolerated procedure without any complication.  All counts were correct at completion.  Given the complexity of the case,  the assistant was necessary in order to expedient the procedure and safely perform the technical aspects of the operation.  The assistant provided traction and countertraction to assist with exposure of the artery and vein.  They also assisted with suture ligation of multiple venous branches.  They played a critical role in the anastomosis. These skills, especially following the Prolene suture for the anastomosis, could not have been adequately performed by a scrub tech assistant.    EBL: 20   Pieter Fooks C. Donzetta Matters, MD [facility] [place] Office: [phone] Pager: [phone]

## 2022-02-16 NOTE — Anesthesia Preprocedure Evaluation (Addendum)
Anesthesia Evaluation  Patient identified by MRN, date of birth, ID band Patient awake    Reviewed: Allergy & Precautions, NPO status , Patient's Chart, lab work & pertinent test results  Airway Mallampati: III  TM Distance: >3 FB Neck ROM: Full    Dental  (+) Dental Advisory Given, Poor Dentition   Pulmonary sleep apnea (refuses CPAP) , former smoker   Pulmonary exam normal breath sounds clear to auscultation       Cardiovascular hypertension, Pt. on medications + CAD, + Peripheral Vascular Disease and +CHF (normal LVEF, grade 1 diastolic dysfunction)  Normal cardiovascular exam+ Valvular Problems/Murmurs (mild MS)  Rhythm:Regular Rate:Normal  Echo 2023  1. Left ventricular ejection fraction, by estimation, is >75%. The left  ventricle has hyperdynamic function. The left ventricle has no regional  wall motion abnormalities. There is moderate concentric left ventricular  hypertrophy. Left ventricular  diastolic parameters are consistent with Grade I diastolic dysfunction  (impaired relaxation). Elevated left atrial pressure.   2. Right ventricular systolic function is normal. The right ventricular  size is normal. Tricuspid regurgitation signal is inadequate for assessing  PA pressure.   3. Left atrial size was mildly dilated.   4. The mitral valve is degenerative. No evidence of mitral valve  regurgitation. Mild mitral stenosis. The mean mitral valve gradient is 7.0  mmHg with average heart rate of 118 bpm. Severe mitral annular  calcification.   5. The aortic valve is tricuspid. There is mild calcification of the  aortic valve. There is mild thickening of the aortic valve. Aortic valve  regurgitation is not visualized. Aortic valve sclerosis is present, with  no evidence of aortic valve stenosis.     Neuro/Psych negative neurological ROS  negative psych ROS   GI/Hepatic Neg liver ROS, hiatal hernia,GERD  Controlled and  Medicated,,  Endo/Other  diabetes    Renal/GU ESRF and DialysisRenal disease  negative genitourinary   Musculoskeletal  (+) Arthritis , Osteoarthritis,    Abdominal   Peds  Hematology negative hematology ROS (+)   Anesthesia Other Findings   Reproductive/Obstetrics negative OB ROS                             Anesthesia Physical Anesthesia Plan  ASA: 3  Anesthesia Plan: MAC and Regional   Post-op Pain Management: Regional block* and Tylenol PO (pre-op)*   Induction:   PONV Risk Score and Plan: 2 and Propofol infusion and TIVA  Airway Management Planned: Natural Airway and Simple Face Mask  Additional Equipment: None  Intra-op Plan:   Post-operative Plan:   Informed Consent: I have reviewed the patients History and Physical, chart, labs and discussed the procedure including the risks, benefits and alternatives for the proposed anesthesia with the patient or authorized representative who has indicated his/her understanding and acceptance.       Plan Discussed with: CRNA  Anesthesia Plan Comments:        Anesthesia Quick Evaluation

## 2022-02-16 NOTE — Interval H&P Note (Signed)
History and Physical Interval Note:  02/16/2022 7:20 AM  Jon Anderson  has presented today for surgery, with the diagnosis of ESRD.  The various methods of treatment have been discussed with the patient and family. After consideration of risks, benefits and other options for treatment, the patient has consented to  Procedure(s): RIGHT ARM ARTERIOVENOUS (AV) FISTULA VERSUS ARTERIOVENOUS GORE-TEX GRAFT CREATION (Right) as a surgical intervention.  The patient's history has been reviewed, patient examined, no change in status, stable for surgery.  I have reviewed the patient's chart and labs.  Questions were answered to the patient's satisfaction.     [name]

## 2022-02-16 NOTE — Anesthesia Procedure Notes (Signed)
Anesthesia Regional Block: Supraclavicular block   Pre-Anesthetic Checklist: , timeout performed,  Correct Patient, Correct Site, Correct Laterality,  Correct Procedure, Correct Position, site marked,  Risks and benefits discussed,  Surgical consent,  Pre-op evaluation,  At surgeon's request and post-op pain management  Laterality: Right  Prep: Maximum Sterile Barrier Precautions used, chloraprep       Needles:  Injection technique: Single-shot  Needle Type: Echogenic Stimulator Needle     Needle Length: 9cm  Needle Gauge: 22     Additional Needles:   Procedures:,,,, ultrasound used (permanent image in chart),,    Narrative:  Start time: 02/16/2022 8:50 AM End time: 02/16/2022 8:55 AM Injection made incrementally with aspirations every 5 mL.  Performed by: Personally  Anesthesiologist: Pervis Hocking, DO  Additional Notes: Monitors applied. No increased pain on injection. No increased resistance to injection. Injection made in 5cc increments. Good needle visualization. Patient tolerated procedure well.

## 2022-02-17 ENCOUNTER — Encounter (HOSPITAL_COMMUNITY): Payer: Self-pay | Admitting: Vascular Surgery

## 2022-03-06 ENCOUNTER — Other Ambulatory Visit (HOSPITAL_COMMUNITY): Payer: Self-pay | Admitting: Cardiovascular Disease

## 2022-03-06 DIAGNOSIS — I6522 Occlusion and stenosis of left carotid artery: Secondary | ICD-10-CM

## 2022-03-16 ENCOUNTER — Other Ambulatory Visit (HOSPITAL_COMMUNITY): Payer: Self-pay | Admitting: Cardiovascular Disease

## 2022-03-16 DIAGNOSIS — I739 Peripheral vascular disease, unspecified: Secondary | ICD-10-CM

## 2022-03-21 ENCOUNTER — Other Ambulatory Visit: Payer: Self-pay | Admitting: *Deleted

## 2022-03-21 DIAGNOSIS — N186 End stage renal disease: Secondary | ICD-10-CM

## 2022-03-28 NOTE — Progress Notes (Deleted)
POST OPERATIVE OFFICE NOTE    CC:  F/u for surgery  HPI:  This is a 71 y.o. male who is s/p right 1st stage BVT on 02/16/2022 by Dr. Donzetta Matters.  Dialysis access Hx: -left BC AVF 03/19/2014 Dr. Kellie Simmering - Revision of left upper arm AV fistula with resection of threatened skin and plication of underlying venous aneurysm  02/10/2020 Dr. Donnetta Hutching -Revision with plication of left arm AV fistula 03/23/2020 Dr. Donnetta Hutching -left BVT 11/09/2020 Dr. Donzetta Matters (evacuation hematoma 11/09/2020 Dr. Scot Dock) -2nd stage left BVT with interposition cephalic vein graft 5/0/2774 Dr. Donzetta Matters - Balloon assisted maturation left arm AV fistula with 5 and 7 mm balloons  01/10/2021 Dr. Donzetta Matters - Left upper arm AV fistula revision with interposition AV graft 02/15/2021 Dr. Donzetta Matters -left peripheral angioplasty of distal upper arm graft near arterial anastomosis and arterial anastmosis 05/19/2021 Dr. 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 Donzetta Matters  Prior to his last surgery, pt felt strongly about not getting contrast as he stated he had cardiac arrest even after premedication.  Pt has PAD and carotid stenosis that is followed by Dr. Gwenlyn Found.   Pt states he does *** have pain/numbness in the *** hand.    The pt is on dialysis *** at *** location.   Allergies  Allergen Reactions   Contrast Media [Iodinated Contrast Media] Anaphylaxis    Cardiac arrest, Throat swelling, hives, SOB   Kiwi Extract Itching, Swelling and Other (See Comments)    Lips and face swell- breathing not affected   Flexeril [Cyclobenzaprine]     Hands become flimsy, can not hold things   Tape Other (See Comments)    "Plastic" tape causes blisters!!    Current Outpatient Medications  Medication Sig Dispense Refill   acetaminophen (TYLENOL) 500 MG tablet Take 1,000 mg by mouth every 6 (six) hours as needed for moderate pain.     aspirin EC 81 MG tablet Take 1 tablet (81 mg total) by mouth daily. (Patient taking differently: Take 81 mg by mouth at bedtime.)     atorvastatin  (LIPITOR) 40 MG tablet Take 1 tablet (40 mg total) by mouth daily at 6 PM. 30 tablet 5   CALCITRIOL PO Take 1 tablet by mouth every Monday, Wednesday, and Friday.     calcium acetate (PHOSLO) 667 MG capsule Take 2 capsules (1,334 mg total) by mouth 2 (two) times daily with a meal. 180 capsule 0   clopidogrel (PLAVIX) 75 MG tablet Take 75 mg by mouth daily.     Etelcalcetide HCl (PARSABIV IV) Etelcalcetide (Parsabiv)     gabapentin (NEURONTIN) 100 MG capsule Take 100-200 mg by mouth See admin instructions. 100 mg in the morning, 200 mg in the evening     HYDROcodone-acetaminophen (NORCO/VICODIN) 5-325 MG tablet Take 1 tablet by mouth every 6 (six) hours as needed for moderate pain. 10 tablet 0   loperamide (IMODIUM) 2 MG capsule Take 2 mg by mouth as needed for diarrhea or loose stools.     midodrine (PROAMATINE) 10 MG tablet Take 10 mg by mouth 2 (two) times daily as needed.     multivitamin (RENA-VIT) TABS tablet Take 1 tablet by mouth at bedtime. 30 tablet 0   ONETOUCH ULTRA test strip See admin instructions.     pantoprazole (PROTONIX) 40 MG tablet Take 40 mg by mouth daily.     predniSONE (DELTASONE) 50 MG tablet Take one tablet at 13 hours, 7 hours and 1 hour prior to your procedure. (Patient not taking: Reported on  02/07/2022) 3 tablet 0   No current facility-administered medications for this visit.     ROS:  See HPI  Physical Exam:  ***  Incision:  *** Extremities:   There *** a palpable *** pulse.   Motor and sensory *** in tact.   There *** a thrill/bruit present.  Access is *** easily palpable   Dialysis Duplex on 03/29/2022: ***   Assessment/Plan:  This is a 71 y.o. 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 Donzetta Matters.   -the pt does *** have evidence of steal. -pt's access can be used ***. -if pt has tunneled catheter, this can be removed at the discretion of the dialysis center once the pt's access has been successfully cannulated to their satisfaction.   -discussed with pt that access does not last forever and will need intervention or even new access at some point.  -the pt will follow up ***   Leontine Locket, [facility] [phone]  Clinic MD:  Donzetta Matters

## 2022-03-29 ENCOUNTER — Ambulatory Visit (HOSPITAL_COMMUNITY): Payer: Medicare Other | Attending: Vascular Surgery

## 2022-03-29 ENCOUNTER — Encounter (HOSPITAL_COMMUNITY): Payer: Self-pay

## 2022-04-04 ENCOUNTER — Other Ambulatory Visit: Payer: Self-pay | Admitting: *Deleted

## 2022-04-04 DIAGNOSIS — N186 End stage renal disease: Secondary | ICD-10-CM

## 2022-04-06 ENCOUNTER — Ambulatory Visit (INDEPENDENT_AMBULATORY_CARE_PROVIDER_SITE_OTHER): Payer: Medicare Other | Admitting: Physician Assistant

## 2022-04-06 ENCOUNTER — Encounter: Payer: Self-pay | Admitting: Physician Assistant

## 2022-04-06 ENCOUNTER — Ambulatory Visit (HOSPITAL_COMMUNITY)
Admission: RE | Admit: 2022-04-06 | Discharge: 2022-04-06 | Disposition: A | Discharge status: Home / Self Care | Payer: Medicare Other | Source: Ambulatory Visit | Attending: Vascular Surgery | Admitting: Vascular Surgery

## 2022-04-06 VITALS — BP 141/75 | HR 77 | Temp 97.7°F | Resp 20 | Ht 69.0 in | Wt 181.0 lb

## 2022-04-06 DIAGNOSIS — N186 End stage renal disease: Secondary | ICD-10-CM | POA: Diagnosis present

## 2022-04-06 NOTE — Progress Notes (Signed)
Postoperative Access Visit   History of Present Illness   Jon Anderson is a 71 y.o. year old male who presents for postoperative follow-up for: right arm brachiobasilic vein fistula creation on 02/16/22 by Dr. Donzetta Matters. The patient's wounds are well healed.  The patient notes no steal symptoms.  He has history of multiple prior failed left upper extremity access.  He currently dialyzes via [facility] on MWF at [facility] [address] location  Physical Examination   Vitals:   04/06/22 1332  BP: (!) 141/75  Pulse: 77  Resp: 20  Temp: 97.7 F (36.5 C)  TempSrc: Temporal  SpO2: 97%  Weight: 181 lb (82.1 kg)  Height: '5\' 9"'$  (1.753 m)   Body mass index is 26.73 kg/m.  right arm Incision is well healed, 2+ radial pulse, hand grip is 5/5, sensation in digits is intact, palpable thrill, bruit can be auscultated    Non invasive vascular lab: Findings:  +--------------------+----------+-----------------+--------+  AVF                PSV (cm/s)Flow Vol (mL/min)Comments  +--------------------+----------+-----------------+--------+  Native artery inflow   269          1594                 +--------------------+----------+-----------------+--------+  AVF Anastomosis        571                     stenotic  +--------------------+----------+-----------------+--------+     +------------+---------+-------------+---------+---------------------------  ---+  OUTFLOW VEIN   PSV   Diameter (cm)  Depth             Describe                           (cm/s)                 (cm)                                    +------------+---------+-------------+---------+---------------------------  ---+  Prox UA        163       0.57       0.74                                    +------------+---------+-------------+---------+---------------------------  ---+  Mid UA         166       0.59       0.71                                     +------------+---------+-------------+---------+---------------------------  ---+  Dist UA        256       0.68       0.83                                    +------------+---------+-------------+---------+---------------------------  ---+  AC Fossa       593       0.46       0.49      intimal hyperplasia and  stenotic              +------------+---------+-------------+---------+---------------------------  ---+   Summary:  Arteriovenous fistula-Stenosis noted in the anastomosis and outflow vein at the [facility] fossa.    Medical Decision Making   Jon Anderson is a 71 y.o. year old male who presents s/p right arm brachiobasilic vein fistula creation on 02/16/22 by Dr. Donzetta Matters. Incision is well healed. Fistula is patent, well matured with good volume flow. Fistula is however deep in right upper arm.  He is without any signs or symptoms of steal. Based on his duplex he needs a 2nd stage transposition of his fistula. I discussed risks/ benefits with patient regarding his surgery. He dialyzes on MWF so will try to arrange this in the near future on a non dialysis day. Will schedule him for a right brachiobasilic fistula transposition with Dr. Donzetta Matters.    Karoline Caldwell, PA-C [facility] [place] Office: [phone]  Clinic MD: Jon Anderson

## 2022-04-06 NOTE — H&P (View-Only) (Signed)
Postoperative Access Visit   History of Present Illness   Jon Anderson is a 71 y.o. year old male who presents for postoperative follow-up for: right arm brachiobasilic vein fistula creation on 02/16/22 by Dr. Donzetta Matters. The patient's wounds are well healed.  The patient notes no steal symptoms.  He has history of multiple prior failed left upper extremity access.  He currently dialyzes via [facility] on MWF at [facility] [address] location  Physical Examination   Vitals:   04/06/22 1332  BP: (!) 141/75  Pulse: 77  Resp: 20  Temp: 97.7 F (36.5 C)  TempSrc: Temporal  SpO2: 97%  Weight: 181 lb (82.1 kg)  Height: '5\' 9"'$  (1.753 m)   Body mass index is 26.73 kg/m.  right arm Incision is well healed, 2+ radial pulse, hand grip is 5/5, sensation in digits is intact, palpable thrill, bruit can be auscultated    Non invasive vascular lab: Findings:  +--------------------+----------+-----------------+--------+  AVF                PSV (cm/s)Flow Vol (mL/min)Comments  +--------------------+----------+-----------------+--------+  Native artery inflow   269          1594                 +--------------------+----------+-----------------+--------+  AVF Anastomosis        571                     stenotic  +--------------------+----------+-----------------+--------+     +------------+---------+-------------+---------+---------------------------  ---+  OUTFLOW VEIN   PSV   Diameter (cm)  Depth             Describe                           (cm/s)                 (cm)                                    +------------+---------+-------------+---------+---------------------------  ---+  Prox UA        163       0.57       0.74                                    +------------+---------+-------------+---------+---------------------------  ---+  Mid UA         166       0.59       0.71                                     +------------+---------+-------------+---------+---------------------------  ---+  Dist UA        256       0.68       0.83                                    +------------+---------+-------------+---------+---------------------------  ---+  AC Fossa       593       0.46       0.49      intimal hyperplasia and  stenotic              +------------+---------+-------------+---------+---------------------------  ---+   Summary:  Arteriovenous fistula-Stenosis noted in the anastomosis and outflow vein at the [facility] fossa.    Medical Decision Making   Jon Anderson is a 71 y.o. year old male who presents s/p right arm brachiobasilic vein fistula creation on 02/16/22 by Dr. Donzetta Matters. Incision is well healed. Fistula is patent, well matured with good volume flow. Fistula is however deep in right upper arm.  He is without any signs or symptoms of steal. Based on his duplex he needs a 2nd stage transposition of his fistula. I discussed risks/ benefits with patient regarding his surgery. He dialyzes on MWF so will try to arrange this in the near future on a non dialysis day. Will schedule him for a right brachiobasilic fistula transposition with Dr. Donzetta Matters.    Karoline Caldwell, PA-C [facility] [place] Office: [phone]  Clinic MD: Scot Dock

## 2022-04-07 ENCOUNTER — Other Ambulatory Visit: Payer: Self-pay

## 2022-04-07 ENCOUNTER — Telehealth: Payer: Self-pay

## 2022-04-07 DIAGNOSIS — N186 End stage renal disease: Secondary | ICD-10-CM

## 2022-04-07 NOTE — Telephone Encounter (Signed)
Attempted to reach patient to schedule second stage BVT, but no answer. Unable to leave voicemail message.

## 2022-04-07 NOTE — Telephone Encounter (Signed)
Spoke with patient. Scheduled for surgery on 04/18/22. Instructions provided. Patient verbalized understanding.

## 2022-04-17 ENCOUNTER — Encounter (HOSPITAL_COMMUNITY): Payer: Self-pay | Admitting: Vascular Surgery

## 2022-04-17 NOTE — Progress Notes (Signed)
Anesthesia Chart Review: Jon Anderson  Case: [id number] Date/Time: 04/18/22 1221   Procedure: RIGHT BRACHIOBASILIC FISTULA SECOND STAGE TRANSPOSITION (Right)   Anesthesia type: Choice   Pre-op diagnosis: ESRD   Location: [facility] OR ROOM 12 / [place] [facility]   Surgeons: Waynetta Sandy, MD       DISCUSSION: Patient is a 72 year old male scheduled for the above procedure. He is a hemodialysis patient. S/p first stage right arm brachial artery to basilic vein AVF creation on 11/89/23. He currently dialyzes via [facility] on MWF at [address] location.   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  Last cardiology evaluation was on 12/27/21 by Diona Browner, NP. CAD felt stable. He did have some residual chest soreness from chest compressions related to PEA arrest on 11/15/21. PEA arrest felt likely from anaphylaxis related to contrast allergy despite premedication. Some recent dyspnea in the setting of COVID-19. No ischemic testing recommended at that time. Six month follow-up planned. One 01/13/22, he was felt acceptable CV risk for colonoscopy.   He underwent first  stage BVT in November, and is now for 2nd stage. Anesthesia team to evaluate on the day of surgery. Per VVS, he is to hold Plavix for 5 days and continue ASA for the procedure.   VS:  BP Readings from Last 3 Encounters:  04/06/22 (!) 141/75  02/16/22 108/70  02/07/22 102/60   Pulse Readings from Last 3 Encounters:  04/06/22 77  02/16/22 72  02/07/22 70     PROVIDERS: - PCP has been Dr. [name] - Nephrologist is Dr. Donato Heinz.  - Cardiologist is Dr. [name]. He also had seen cardiologist Pu, Alexandria Lodge, MD with [facility] before and after his 2018 CABG, last visit 10/31/18 for consideration of cardiac testing for pre-renal transplant evaluation.    LABS: For day of surgery. Last results in [facility] include: Lab Results  Component Value Date   WBC 7.5 11/26/2021   HGB 11.9 (L) 02/16/2022   HCT 35.0 (L) 02/16/2022   PLT 235 11/26/2021   GLUCOSE 112 (H) 02/16/2022   ALT 20 11/26/2021   AST 20 11/26/2021   NA 137 02/16/2022   K 3.6 02/16/2022   CL 97 (L) 02/16/2022   CREATININE 7.50 (H) 02/16/2022   BUN 23 02/16/2022   CO2 30 11/26/2021   INR 1.0 01/20/2022   HGBA1C 7.6 (H) 11/15/2021    IMAGES: CXR 01/20/22: FINDINGS: Right-sided central venous catheter tip over the cavoatrial region. No acute airspace disease or effusion. Normal cardiac size. Aortic atherosclerosis. No pneumothorax IMPRESSION: No active cardiopulmonary disease. Right-sided central venous catheter with tip over the cavoatrial region    EKG: 11/15/21 (post PEA arrest with ROSC):  Sinus tachycardia at 137 bpm Low voltage QRS Septal infarct , age undetermined Abnormal ECG When compared  with ECG of 15-Oct-2019 12:11, PREVIOUS ECG IS PRESENT Confirmed by Cherlynn Kaiser [phone]) on 11/17/2021 5:06:48 AM   CV: US Carotid 12/27/21: Summary:  - Right Carotid: Velocities in the right ICA are consistent with a 1-39% stenosis. Non-hemodynamically significant plaque <50% noted in the CCA. The  ECA appears >50% stenosed.  - Left Carotid: Velocities in the left ICA are consistent with a 40-59% stenosis, high-end of range, based on PSV/plaque. Non-hemodynamically significant plaque <50% noted in the CCA. Heterogenous plaque in the distal CCA extends into the ostial LICA and LECA. The ECA appears >50% stenosed.  - Vertebrals:  Bilateral vertebral arteries demonstrate antegrade flow.  - Subclavians: Normal flow hemodynamics were seen in bilateral subclavian arteries.    Echo 11/15/21: IMPRESSIONS   1. Left ventricular ejection fraction, by estimation, is >75%. The left  ventricle has hyperdynamic function. The left ventricle has no regional  wall motion abnormalities. There is moderate concentric left ventricular  hypertrophy. Left ventricular  diastolic parameters are consistent with Grade I diastolic dysfunction  (impaired relaxation). Elevated left atrial pressure.   2. Right ventricular systolic function is normal. The right ventricular  size is normal. Tricuspid regurgitation signal is inadequate for assessing  PA pressure.   3. Left atrial size was mildly dilated.   4. The mitral valve is degenerative. No evidence of mitral valve  regurgitation. Mild mitral stenosis. The mean mitral valve gradient is 7.0  mmHg with average heart rate of 118 bpm. Severe mitral annular  calcification.   5. The aortic valve is tricuspid. There is mild calcification of the  aortic valve. There is mild thickening of the aortic valve. Aortic valve  regurgitation is not visualized. Aortic valve sclerosis is present, with  no evidence of aortic valve stenosis.  - Comparison(s): No significant change from prior study. Prior images  reviewed side by side.      Cardiac cath 04/27/17: Prox LAD lesion is 75% stenosed. -Focal lesion that was previously described. LIMA-LAD is widely patent and is normal in caliber. There is competitive flow. Otherwise minimal disease throughout. Nothing made greater than  40% in the ostial circumflex. The left ventricular systolic function is normal. The left ventricular ejection fraction is 50-55% by visual estimate. LV end diastolic pressure is low. - ~0-3 mmHg There is no aortic valve stenosis. Angiographically no culprit lesion to explain the patient's symptoms.  He does have a significant LAD lesion which is a very focal lesion and easily stent pull, however there is a widely patent LIMA graft distally.  There is actually retrograde filling from the LIMA graft to the diagonal branch which would be the only branch jeopardized by the more upstream LAD lesion. Nothing to explain the patient's symptoms.  In fact, his LVEDP is very low after dialysis.  This would indicate that he was adequately dialyzed and argue against increased LVEDP causing microvascular ischemic symptoms.     Past Medical History:  Diagnosis Date   Anemia of chronic disease    Arthritis    CAD (coronary artery disease)    Carotid stenosis    a. <09% RICA, >47% LICA by duplex 0/9628   Chronic chest pain    occ   Constipation    chronic   Dyspnea    occasional with extertion   ESRD (end stage renal disease) on dialysis ([place])    M-W-F- [name]   GERD (gastroesophageal reflux disease)    History of hiatal hernia    History of kidney stones  Passed   HNP (herniated nucleus pulposus), lumbar    HTN (hypertension)    echo 3/10: EF 60%, LAE   Hyperlipidemia    Mitral stenosis    mild MS 11/15/21   Nephrolithiasis    "passed them all"   PEA (Pulseless electrical activity) ([place]) 11/15/2021   PEA arrest felt likely related to contrast anaphylaxis despite premedication   Peripheral arterial disease ([place])    a. s/p PTCA  right    Pneumonia yrs ago   Restless legs    Sciatic leg pain    Sleep apnea    does not use cpap   Snores    a. presumed OSA, pt has refused sleep eval in past.   Type II diabetes mellitus ([place])    no longer on medications, checks blood glucose at home    Urinary frequency    Uses wheelchair    Walker as ambulation aid     Past Surgical History:  Procedure Laterality Date   A/V FISTULAGRAM Left 01/10/2021   Procedure: A/V FISTULAGRAM;  Surgeon: Waynetta Sandy, MD;  Location: [place] [facility];  Service: Cardiovascular;  Laterality: Left;   A/V FISTULAGRAM Left 05/19/2021   Procedure: A/V Fistulagram;  Surgeon: Marty Heck, MD;  Location: [place] [facility];  Service: Cardiovascular;  Laterality: Left;   ABDOMINAL AORTOGRAM W/LOWER EXTREMITY Bilateral 08/21/2019   Procedure: ABDOMINAL AORTOGRAM W/LOWER EXTREMITY;  Surgeon: Waynetta Sandy, MD;  Location: [place] [facility];  Service: Cardiovascular;  Laterality: Bilateral;   AMPUTATION Right 07/25/2019   Procedure: RIGHT 5th RAY AMPUTATION;  Surgeon: Newt Minion, MD;  Location: [place];  Service: Orthopedics;  Laterality: Right;   AMPUTATION Right 11/19/2019   Procedure: RIGHT TRANSMETATARSAL AMPUTATION;  Surgeon: Newt Minion, MD;  Location: [place];  Service: Orthopedics;  Laterality: Right;   ANGIOPLASTY / STENTING FEMORAL Left 12/11/2013   dr berry   AV FISTULA PLACEMENT Left 03/19/2014   Procedure: CREATION OF ARTERIOVENOUS (AV) FISTULA  LEFT UPPER ARM;  Surgeon: Mal Misty, MD;  Location: [place];  Service: Vascular;  Laterality: Left;   AV FISTULA PLACEMENT Left 11/09/2020   Procedure: LEFT ARM ARTERIOVENOUS (AV) FISTULA CREATION;  Surgeon: Waynetta Sandy, MD;  Location: [place];  Service: Vascular;  Laterality: Left;   AV FISTULA PLACEMENT Left 02/15/2021   Procedure: INSERTION OF LEFT ARM ARTERIOVENOUS GORE-TEX GRAFT;  Surgeon: Waynetta Sandy, MD;  Location: [place];  Service: Vascular;  Laterality: Left;   AV FISTULA PLACEMENT Right 02/16/2022   Procedure: RIGHT ARM [place] (AV) FISTULA CREATION;  Surgeon: Waynetta Sandy, MD;  Location: [place];  Service: Vascular;  Laterality: Right;  Regional and MAC   BACK  SURGERY  01/2018   screws placed    [place] Left 12/14/2020   Procedure: REVISION OF LEFT ARM SECOND STAGE BASILIC VEIN TRANSPOSITION;  Surgeon: Waynetta Sandy, MD;  Location: [place];  Service: Vascular;  Laterality: Left;   CARDIAC CATHETERIZATION  2001 and 2010    COLONOSCOPY W/ BIOPSIES AND POLYPECTOMY     COLONOSCOPY WITH PROPOFOL N/A 08/01/2016   Procedure: COLONOSCOPY WITH PROPOFOL;  Surgeon: Carol Ada, MD;  Location: [facility];  Service: Endoscopy;  Laterality: N/A;   COLONOSCOPY WITH PROPOFOL N/A 01/17/2022   Procedure: COLONOSCOPY WITH PROPOFOL;  Surgeon: Carol Ada, MD;  Location: [facility];  Service: Gastroenterology;  Laterality: N/A;   CORONARY ARTERY BYPASS GRAFT  2019   [facility] x 1 bypass  ESOPHAGOGASTRODUODENOSCOPY (EGD) WITH PROPOFOL N/A 08/01/2016   Procedure: ESOPHAGOGASTRODUODENOSCOPY (EGD) WITH PROPOFOL;  Surgeon: Carol Ada, MD;  Location: [facility];  Service: Endoscopy;  Laterality: N/A;   FISTULA SUPERFICIALIZATION Left 02/10/2020   Procedure: 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;  Surgeon: Rosetta Posner, MD;  Location: [place];  Service: Vascular;  Laterality: Left;   FISTULA SUPERFICIALIZATION Left 03/23/2020   Procedure: LEFT UPPER EXTREMITY ARTERIOVENOUS FISTULA PLICATION;  Surgeon: Rosetta Posner, MD;  Location: [place];  Service: Vascular;  Laterality: Left;   FOOT FRACTURE SURGERY Right    ligament repair   FRACTURE SURGERY     left forearm   GRAFT APPLICATION Right 06/13/6292   Procedure: FAT GRAFT APPLICATION;  Surgeon: Evelina Bucy, DPM;  Location: [place];  Service: Podiatry;  Laterality: Right;   HEMATOMA EVACUATION Left 11/09/2020   Procedure: EVACUATION HEMATOMA LEFT ARM;  Surgeon: Angelia Mould, MD;  Location: [facility] OR;  Service: Vascular;  Laterality: Left;   INGUINAL HERNIA REPAIR Left    IR AV DIALY SHUNT INTRO NEEDLE/INTRACATH INITIAL W/PTA/IMG LEFT  11/15/2021   IR  FLUORO GUIDE CV LINE RIGHT  01/20/2022   IR REMOVAL TUN CV CATH W/O FL  06/16/2021   IR US GUIDE VASC ACCESS LEFT  11/15/2021   IR US GUIDE VASC ACCESS LEFT  11/15/2021   IR US GUIDE VASC ACCESS RIGHT  11/15/2021   IR US GUIDE VASC ACCESS RIGHT  01/20/2022   LEFT HEART CATH AND CORS/GRAFTS ANGIOGRAPHY N/A 04/27/2017   Procedure: LEFT HEART CATH AND CORS/GRAFTS ANGIOGRAPHY;  Surgeon: Leonie Man, MD;  Location: [place] [facility];  Service: Cardiovascular;  Laterality: N/A;   LEFT HEART CATHETERIZATION WITH CORONARY ANGIOGRAM N/A 06/22/2014   Procedure: LEFT HEART CATHETERIZATION WITH CORONARY ANGIOGRAM;  Surgeon: Troy Sine, MD;  Location: [facility] C[facility] LAB;  Service: Cardiovascular;  Laterality: N/A;   LOWER EXTREMITY ANGIOGRAM Left 12/11/2013   Procedure: LOWER EXTREMITY ANGIOGRAM;  Surgeon: Lorretta Harp, MD;  Location: [facility] C[facility] LAB;  Service: Cardiovascular;  Laterality: Left;   LUMBAR LAMINECTOMY/DECOMPRESSION MICRODISCECTOMY Right 07/03/2017   Procedure: MICRODISCECTOMY LUMBAR FIVE - SACRAL ONE RIGHT;  Surgeon: Consuella Lose, MD;  Location: [place];  Service: Neurosurgery;  Laterality: Right;   LUMBAR LAMINECTOMY/DECOMPRESSION MICRODISCECTOMY Right 10/19/2017   Procedure: MICRODISCECTOMY LUMBAR FIVE- SACRAL 1 ONE ;  Surgeon: Consuella Lose, MD;  Location: [place];  Service: Neurosurgery;  Laterality: Right;   PERIPHERAL VASCULAR BALLOON ANGIOPLASTY Left 05/19/2021   Procedure: PERIPHERAL VASCULAR BALLOON ANGIOPLASTY;  Surgeon: Marty Heck, MD;  Location: [place] [facility];  Service: Cardiovascular;  Laterality: Left;   PERIPHERAL VASCULAR INTERVENTION Left 01/10/2021   Procedure: PERIPHERAL VASCULAR INTERVENTION;  Surgeon: Waynetta Sandy, MD;  Location: [place] [facility];  Service: Cardiovascular;  Laterality: Left;   POLYPECTOMY  01/17/2022   Procedure: POLYPECTOMY;  Surgeon: Carol Ada, MD;  Location: [facility];  Service: Gastroenterology;;   THROMBECTOMY W/  EMBOLECTOMY Left 11/09/2020   Procedure: THROMBECTOMY OF LEFT ARTERIOVENOUS FISTULA;  Surgeon: Angelia Mould, MD;  Location: [place];  Service: Vascular;  Laterality: Left;   TONSILLECTOMY AND ADENOIDECTOMY     WISDOM TOOTH EXTRACTION     WOUND DEBRIDEMENT Right 05/13/2019   Procedure: [place];  Surgeon: Evelina Bucy, DPM;  Location: [place];  Service: Podiatry;  Laterality: Right;    MEDICATIONS: No current facility-administered medications for this encounter.    acetaminophen (TYLENOL) 500 MG tablet  aspirin EC 81 MG tablet   atorvastatin (LIPITOR) 40 MG tablet   CALCITRIOL PO   calcium acetate (PHOSLO) 667 MG capsule   clopidogrel (PLAVIX) 75 MG tablet   Etelcalcetide HCl (PARSABIV IV)   gabapentin (NEURONTIN) 100 MG capsule   HYDROcodone-acetaminophen (NORCO/VICODIN) 5-325 MG tablet   loperamide (IMODIUM) 2 MG capsule   midodrine (PROAMATINE) 10 MG tablet   multivitamin (RENA-VIT) TABS tablet   ONETOUCH ULTRA test strip   pantoprazole (PROTONIX) 40 MG tablet   predniSONE (DELTASONE) 50 MG tablet    Myra Gianotti, PA-C Surgical Short Stay/Anesthesiology [facility] Phone [phone] [facility] Phone [phone] 04/17/2022 1:59 PM

## 2022-04-17 NOTE — Progress Notes (Signed)
PCP - Dr [name] Cardiologist - Dr [name] Pulmonology - Dr Simonne Maffucci Nephrologist - Dr Donato Heinz  Chest x-ray - 01/20/22 (2V) EKG - 11/15/21 Stress Test - 08/24/16 CE ECHO - 11/15/21 Cardiac Cath - 04/27/17  ICD Pacemaker/Loop - n/a  Sleep Study -  Yes CPAP - does not use CPAP  DM Type 2 - No meds, diet controlled.  If your blood sugar is less than 70 mg/dL, you will need to treat for low blood sugar: Treat a low blood sugar (less than 70 mg/dL) with  cup of clear juice (cranberry or apple), 4 glucose tablets, OR glucose gel. Recheck blood sugar in 15 minutes after treatment (to make sure it is greater than 70 mg/dL). If your blood sugar is not greater than 70 mg/dL on recheck, call [phone] for further instructions.  Plavix Instructions: Follow your surgeon's instructions on when to stop Plavix  prior to surgery,  If no instructions were given by your surgeon then you will need to call the office for those instructions.  Last dose of Plavix was on 04/12/22.  Anesthesia review: Yes  STOP now taking any Aspirin (unless otherwise instructed by your surgeon), Aleve, Naproxen, Ibuprofen, Motrin, Advil, Goody's, BC's, all herbal medications, fish oil, and all vitamins.   Coronavirus Screening Do you have any of the following symptoms:  Cough yes/no: No Fever (>100.86F)  yes/no: No Runny nose yes/no: No Sore throat yes/no: No Difficulty breathing/shortness of breath  yes/no: No  Have you traveled in the last 14 days and where? yes/no: No  Patient verbalized understanding of instructions that were given via phone.

## 2022-04-17 NOTE — Anesthesia Preprocedure Evaluation (Signed)
Anesthesia Evaluation  Patient identified by MRN, date of birth, ID band Patient awake    Reviewed: Allergy & Precautions, NPO status , Patient's Chart, lab work & pertinent test results  Airway Mallampati: II  TM Distance: >3 FB Neck ROM: Full    Dental  (+) Dental Advisory Given   Pulmonary sleep apnea , former smoker   breath sounds clear to auscultation       Cardiovascular hypertension, Pt. on medications + CAD, + CABG, + Peripheral Vascular Disease and +CHF   Rhythm:Regular Rate:Normal     Neuro/Psych  Neuromuscular disease    GI/Hepatic Neg liver ROS, hiatal hernia,GERD  ,,  Endo/Other  diabetes, Type 2    Renal/GU ESRF and DialysisRenal disease     Musculoskeletal   Abdominal   Peds  Hematology  (+) Blood dyscrasia, anemia   Anesthesia Other Findings   Reproductive/Obstetrics                             Anesthesia Physical Anesthesia Plan  ASA: 4  Anesthesia Plan: General   Post-op Pain Management: Tylenol PO (pre-op)* and Minimal or no pain anticipated   Induction: Intravenous  PONV Risk Score and Plan: 2 and Dexamethasone, Ondansetron and Treatment may vary due to age or medical condition  Airway Management Planned: LMA  Additional Equipment:   Intra-op Plan:   Post-operative Plan: Extubation in OR  Informed Consent: I have reviewed the patients History and Physical, chart, labs and discussed the procedure including the risks, benefits and alternatives for the proposed anesthesia with the patient or authorized representative who has indicated his/her understanding and acceptance.     Dental advisory given  Plan Discussed with: CRNA  Anesthesia Plan Comments: (  )       Anesthesia Quick Evaluation

## 2022-04-18 ENCOUNTER — Other Ambulatory Visit: Payer: Self-pay

## 2022-04-18 ENCOUNTER — Ambulatory Visit (HOSPITAL_COMMUNITY): Payer: Medicare Other | Admitting: Physician Assistant

## 2022-04-18 ENCOUNTER — Encounter (HOSPITAL_COMMUNITY): Payer: Self-pay | Admitting: Vascular Surgery

## 2022-04-18 ENCOUNTER — Ambulatory Visit (HOSPITAL_BASED_OUTPATIENT_CLINIC_OR_DEPARTMENT_OTHER): Payer: Medicare Other | Admitting: Physician Assistant

## 2022-04-18 ENCOUNTER — Encounter (HOSPITAL_COMMUNITY)
Admission: RE | Disposition: A | Discharge status: Home / Self Care | Payer: Self-pay | Source: Home / Self Care | Attending: Vascular Surgery

## 2022-04-18 ENCOUNTER — Ambulatory Visit (HOSPITAL_COMMUNITY)
Admission: RE | Admit: 2022-04-18 | Discharge: 2022-04-18 | Disposition: A | Discharge status: Home / Self Care | Payer: Medicare Other | Attending: Vascular Surgery | Admitting: Vascular Surgery

## 2022-04-18 DIAGNOSIS — E1122 Type 2 diabetes mellitus with diabetic chronic kidney disease: Secondary | ICD-10-CM

## 2022-04-18 DIAGNOSIS — T82898A Other specified complication of vascular prosthetic devices, implants and grafts, initial encounter: Secondary | ICD-10-CM

## 2022-04-18 DIAGNOSIS — I2510 Atherosclerotic heart disease of native coronary artery without angina pectoris: Secondary | ICD-10-CM | POA: Diagnosis not present

## 2022-04-18 DIAGNOSIS — Z951 Presence of aortocoronary bypass graft: Secondary | ICD-10-CM | POA: Diagnosis not present

## 2022-04-18 DIAGNOSIS — I509 Heart failure, unspecified: Secondary | ICD-10-CM

## 2022-04-18 DIAGNOSIS — X58XXXA Exposure to other specified factors, initial encounter: Secondary | ICD-10-CM | POA: Insufficient documentation

## 2022-04-18 DIAGNOSIS — I110 Hypertensive heart disease with heart failure: Secondary | ICD-10-CM | POA: Insufficient documentation

## 2022-04-18 DIAGNOSIS — Z992 Dependence on renal dialysis: Secondary | ICD-10-CM | POA: Diagnosis not present

## 2022-04-18 DIAGNOSIS — T82858A Stenosis of vascular prosthetic devices, implants and grafts, initial encounter: Secondary | ICD-10-CM | POA: Diagnosis not present

## 2022-04-18 DIAGNOSIS — I120 Hypertensive chronic kidney disease with stage 5 chronic kidney disease or end stage renal disease: Secondary | ICD-10-CM | POA: Insufficient documentation

## 2022-04-18 DIAGNOSIS — Z87891 Personal history of nicotine dependence: Secondary | ICD-10-CM | POA: Diagnosis not present

## 2022-04-18 DIAGNOSIS — K219 Gastro-esophageal reflux disease without esophagitis: Secondary | ICD-10-CM | POA: Insufficient documentation

## 2022-04-18 DIAGNOSIS — G4730 Sleep apnea, unspecified: Secondary | ICD-10-CM | POA: Diagnosis not present

## 2022-04-18 DIAGNOSIS — E785 Hyperlipidemia, unspecified: Secondary | ICD-10-CM | POA: Diagnosis not present

## 2022-04-18 DIAGNOSIS — G709 Myoneural disorder, unspecified: Secondary | ICD-10-CM | POA: Diagnosis not present

## 2022-04-18 DIAGNOSIS — N186 End stage renal disease: Secondary | ICD-10-CM

## 2022-04-18 DIAGNOSIS — I132 Hypertensive heart and chronic kidney disease with heart failure and with stage 5 chronic kidney disease, or end stage renal disease: Secondary | ICD-10-CM | POA: Diagnosis not present

## 2022-04-18 DIAGNOSIS — N185 Chronic kidney disease, stage 5: Secondary | ICD-10-CM

## 2022-04-18 DIAGNOSIS — E1151 Type 2 diabetes mellitus with diabetic peripheral angiopathy without gangrene: Secondary | ICD-10-CM | POA: Insufficient documentation

## 2022-04-18 DIAGNOSIS — D631 Anemia in chronic kidney disease: Secondary | ICD-10-CM

## 2022-04-18 HISTORY — DX: Rheumatic mitral stenosis: I05.0

## 2022-04-18 HISTORY — PX: BASCILIC VEIN TRANSPOSITION: SHX5742

## 2022-04-18 LAB — POCT I-STAT, CHEM 8
BUN: 24 mg/dL — ABNORMAL HIGH (ref 8–23)
Calcium, Ion: 1.14 mmol/L — ABNORMAL LOW (ref 1.15–1.40)
Chloride: 95 mmol/L — ABNORMAL LOW (ref 98–111)
Creatinine, Ser: 9.1 mg/dL — ABNORMAL HIGH (ref 0.61–1.24)
Glucose, Bld: 117 mg/dL — ABNORMAL HIGH (ref 70–99)
HCT: 37 % — ABNORMAL LOW (ref 39.0–52.0)
Hemoglobin: 12.6 g/dL — ABNORMAL LOW (ref 13.0–17.0)
Potassium: 3.8 mmol/L (ref 3.5–5.1)
Sodium: 136 mmol/L (ref 135–145)
TCO2: 30 mmol/L (ref 22–32)

## 2022-04-18 LAB — GLUCOSE, CAPILLARY
Glucose-Capillary: 104 mg/dL — ABNORMAL HIGH (ref 70–99)
Glucose-Capillary: 121 mg/dL — ABNORMAL HIGH (ref 70–99)
Glucose-Capillary: 129 mg/dL — ABNORMAL HIGH (ref 70–99)

## 2022-04-18 SURGERY — TRANSPOSITION, VEIN, BASILIC
Anesthesia: General | Site: Arm Upper | Laterality: Right

## 2022-04-18 MED ORDER — PHENYLEPHRINE HCL-NACL 20-0.9 MG/250ML-% IV SOLN: INTRAVENOUS | Status: DC | PRN

## 2022-04-18 MED ORDER — ONDANSETRON HCL 4 MG/2ML IJ SOLN
INTRAMUSCULAR | Status: DC | PRN
  Administered 2022-04-18: 4 mg via INTRAVENOUS

## 2022-04-18 MED ORDER — CHLORHEXIDINE GLUCONATE 4 % EX LIQD: 60.0000 mL | Freq: Once | CUTANEOUS | Status: DC

## 2022-04-18 MED ORDER — ONDANSETRON HCL 4 MG/2ML IJ SOLN
INTRAMUSCULAR | Status: AC
  Filled 2022-04-18: qty 2

## 2022-04-18 MED ORDER — PHENYLEPHRINE HCL-NACL 20-0.9 MG/250ML-% IV SOLN
INTRAVENOUS | Status: DC | PRN
  Administered 2022-04-18: 20 ug/min via INTRAVENOUS

## 2022-04-18 MED ORDER — CHLORHEXIDINE GLUCONATE 0.12 % MT SOLN: 15.0000 mL | Freq: Once | OROMUCOSAL | Status: AC

## 2022-04-18 MED ORDER — HYDROCODONE-ACETAMINOPHEN 5-325 MG PO TABS: 1.0000 | ORAL_TABLET | Freq: Four times a day (QID) | ORAL | 0 refills | Status: DC | PRN

## 2022-04-18 MED ORDER — PHENYLEPHRINE 80 MCG/ML (10ML) SYRINGE FOR IV PUSH (FOR BLOOD PRESSURE SUPPORT)
PREFILLED_SYRINGE | INTRAVENOUS | Status: DC | PRN
  Administered 2022-04-18 (×2): 80 ug via INTRAVENOUS

## 2022-04-18 MED ORDER — CHLORHEXIDINE GLUCONATE 0.12 % MT SOLN
OROMUCOSAL | Status: AC
  Administered 2022-04-18: 15 mL via OROMUCOSAL
  Filled 2022-04-18: qty 15

## 2022-04-18 MED ORDER — PROPOFOL 10 MG/ML IV BOLUS
INTRAVENOUS | Status: DC | PRN
  Administered 2022-04-18: 100 mg via INTRAVENOUS

## 2022-04-18 MED ORDER — FENTANYL CITRATE (PF) 100 MCG/2ML IJ SOLN
INTRAMUSCULAR | Status: AC
  Filled 2022-04-18: qty 2

## 2022-04-18 MED ORDER — ORAL CARE MOUTH RINSE: 15.0000 mL | Freq: Once | OROMUCOSAL | Status: AC

## 2022-04-18 MED ORDER — MIDAZOLAM HCL 2 MG/2ML IJ SOLN
INTRAMUSCULAR | Status: AC
  Filled 2022-04-18: qty 2

## 2022-04-18 MED ORDER — CEFAZOLIN SODIUM-DEXTROSE 2-4 GM/100ML-% IV SOLN
INTRAVENOUS | Status: AC
  Filled 2022-04-18: qty 100

## 2022-04-18 MED ORDER — FENTANYL CITRATE (PF) 100 MCG/2ML IJ SOLN
25.0000 ug | INTRAMUSCULAR | Status: DC | PRN
  Administered 2022-04-18: 25 ug via INTRAVENOUS

## 2022-04-18 MED ORDER — LIDOCAINE-EPINEPHRINE (PF) 1 %-1:200000 IJ SOLN
INTRAMUSCULAR | Status: AC
  Filled 2022-04-18: qty 30

## 2022-04-18 MED ORDER — DEXAMETHASONE SODIUM PHOSPHATE 10 MG/ML IJ SOLN
INTRAMUSCULAR | Status: DC | PRN
  Administered 2022-04-18: 4 mg via INTRAVENOUS

## 2022-04-18 MED ORDER — ACETAMINOPHEN 500 MG PO TABS
1000.0000 mg | ORAL_TABLET | Freq: Once | ORAL | Status: AC
  Administered 2022-04-18: 1000 mg via ORAL
  Filled 2022-04-18: qty 2

## 2022-04-18 MED ORDER — LIDOCAINE 2% (20 MG/ML) 5 ML SYRINGE
INTRAMUSCULAR | Status: DC | PRN
  Administered 2022-04-18: 60 mg via INTRAVENOUS

## 2022-04-18 MED ORDER — DEXAMETHASONE SODIUM PHOSPHATE 10 MG/ML IJ SOLN
INTRAMUSCULAR | Status: AC
  Filled 2022-04-18: qty 1

## 2022-04-18 MED ORDER — FENTANYL CITRATE (PF) 250 MCG/5ML IJ SOLN
INTRAMUSCULAR | Status: AC
  Filled 2022-04-18: qty 5

## 2022-04-18 MED ORDER — CEFAZOLIN SODIUM-DEXTROSE 2-4 GM/100ML-% IV SOLN
2.0000 g | INTRAVENOUS | Status: AC
  Administered 2022-04-18: 2 g via INTRAVENOUS

## 2022-04-18 MED ORDER — LIDOCAINE 2% (20 MG/ML) 5 ML SYRINGE
INTRAMUSCULAR | Status: AC
  Filled 2022-04-18: qty 5

## 2022-04-18 MED ORDER — SODIUM CHLORIDE 0.9 % IV SOLN: INTRAVENOUS | Status: DC

## 2022-04-18 MED ORDER — HEPARIN 6000 UNIT IRRIGATION SOLUTION
Status: DC | PRN
  Administered 2022-04-18: 1

## 2022-04-18 MED ORDER — PROMETHAZINE HCL 25 MG/ML IJ SOLN: 6.2500 mg | INTRAMUSCULAR | Status: DC | PRN

## 2022-04-18 MED ORDER — FENTANYL CITRATE (PF) 100 MCG/2ML IJ SOLN
INTRAMUSCULAR | Status: DC | PRN
  Administered 2022-04-18 (×4): 25 ug via INTRAVENOUS

## 2022-04-18 MED ORDER — HEPARIN 6000 UNIT IRRIGATION SOLUTION
Status: AC
  Filled 2022-04-18: qty 500

## 2022-04-18 MED ORDER — 0.9 % SODIUM CHLORIDE (POUR BTL) OPTIME
TOPICAL | Status: DC | PRN
  Administered 2022-04-18: 1000 mL

## 2022-04-18 SURGICAL SUPPLY — 32 items

## 2022-04-18 NOTE — Interval H&P Note (Signed)
History and Physical Interval Note:  04/18/2022 11:24 AM  Jon Anderson  has presented today for surgery, with the diagnosis of ESRD.  The various methods of treatment have been discussed with the patient and family. After consideration of risks, benefits and other options for treatment, the patient has consented to  Procedure(s): RIGHT BRACHIOBASILIC FISTULA SECOND STAGE TRANSPOSITION (Right) as a surgical intervention.  The patient's history has been reviewed, patient examined, no change in status, stable for surgery.  I have reviewed the patient's chart and labs.  Questions were answered to the patient's satisfaction.     [name]

## 2022-04-18 NOTE — Anesthesia Procedure Notes (Signed)
Procedure Name: LMA Insertion Date/Time: 04/18/2022 12:28 PM  Performed by: Genelle Bal, CRNAPre-anesthesia Checklist: Patient identified, Emergency Drugs available, Suction available and Patient being monitored Patient Re-evaluated:Patient Re-evaluated prior to induction Oxygen Delivery Method: Circle system utilized Preoxygenation: Pre-oxygenation with 100% oxygen Induction Type: IV induction Ventilation: Mask ventilation without difficulty LMA: LMA inserted LMA Size: 4.0 Number of attempts: 1 Airway Equipment and Method: Bite block Placement Confirmation: positive ETCO2 Tube secured with: Tape Dental Injury: Teeth and Oropharynx as per pre-operative assessment

## 2022-04-18 NOTE — Op Note (Signed)
    Patient name: Jon Anderson MRN: [id number] DOB: 05-18-1950 Sex: male  04/18/2022 Pre-operative Diagnosis: ESRD Post-operative diagnosis:  Same Surgeon:  Erlene Quan C. Donzetta Matters, MD Assistant: Paulo Fruit, MD Procedure Performed: Revision of right arm basilic vein fistula with transposition  Indications: 72 year old male with history of end-stage renal disease currently dialyzing via right IJ tunneled dialysis catheter.  He has a failed left arm AV fistula now has a mature basilic vein fistula on the right that is indicated for revision with transposition.  Findings: Fistula measured over 5 mm throughout its course with very strong thrill.  It was somewhat short and was tunneled on the medial aspect of the upper arm and at completion there was a very strong thrill in the fistula and a palpable radial artery pulse at the wrist both confirmed with Doppler.   Procedure:  The patient was identified in the holding area and taken to the operating room suite supine operative when LMA anesthesia was induced.  He was to the prepped draped in the right upper extremity usual fashion, antibiotics were minister timeout was called.  Ultrasound was used to identify the fistula and this was marked on the skin throughout the upper arm.  We made 3 separate skin incisions on the upper arm dissected out the fistula for its entirety dividing branches between clips and ties.  The nerve was protected throughout its course.  When the entirety of the fistula was mobilized we then clamped near the antecubitum and then transected it and flushed with heparinized saline and clamped with bulldog clamp in the axilla and flushed again there was no leaking.  Was tunneled laterally on the arm just under the skin.  Both ends were spatulated and the fascia was then sewn in then with 6-0 Prolene suture.  Prior completion of flushing all directions.  Upon completion there was a strong thrill throughout the upper arm fistula and a palpable  radial artery pulse the wrist confirmed with Doppler.  Satisfied with this we irrigated the wound obtain hemostasis and closed in layers with Vicryl and Monocryl.  Patient was awakened from anesthesia having tolerated procedure without any complication.  Counts were correct at completion.  An experienced assistant was necessary to facilitate exposure of the fistula throughout the upper arm and end-to-end anastomosis of the 2 ends of the fistula.  EBL: 50cc   Vana Arif C. Donzetta Matters, MD [facility] Office: [phone] Pager: [phone]

## 2022-04-18 NOTE — Discharge Instructions (Signed)
[facility]  Discharge Instructions  AV Fistula or Graft Surgery for Dialysis Access  Please refer to the following instructions for your post-procedure care. Your surgeon or physician assistant will discuss any changes with you.  Activity  You may drive the day following your surgery, if you are comfortable and no longer taking prescription pain medication. Resume full activity as the soreness in your incision resolves.  Bathing/Showering  You may shower after you go home. Keep your incision dry for 48 hours. Do not soak in a bathtub, hot tub, or swim until the incision heals completely. You may not shower if you have a hemodialysis catheter.  Incision Care  Clean your incision with mild soap and water after 48 hours. Pat the area dry with a clean towel. You do not need a bandage unless otherwise instructed. Do not apply any ointments or creams to your incision. You may have skin glue on your incision. Do not peel it off. It will come off on its own in about one week. Your arm may swell a bit after surgery. To reduce swelling use pillows to elevate your arm so it is above your heart. Your doctor will tell you if you need to lightly wrap your arm with an ACE bandage.  Diet  Resume your normal diet. There are not special food restrictions following this procedure. In order to heal from your surgery, it is CRITICAL to get adequate nutrition. Your body requires vitamins, minerals, and protein. Vegetables are the best source of vitamins and minerals. Vegetables also provide the perfect balance of protein. Processed food has little nutritional value, so try to avoid this.  Medications  Resume taking all of your medications. If your incision is causing pain, you may take over-the counter pain relievers such as acetaminophen (Tylenol). If you were prescribed a stronger pain medication, please be aware these medications can cause nausea and constipation. Prevent  nausea by taking the medication with a snack or meal. Avoid constipation by drinking plenty of fluids and eating foods with high amount of fiber, such as fruits, vegetables, and grains.  Do not take Tylenol if you are taking prescription pain medications.  Follow up Your surgeon may want to see you in the office following your access surgery. If so, this will be arranged at the time of your surgery.  Please call us immediately for any of the following conditions:  Increased pain, redness, drainage (pus) from your incision site Fever of 101 degrees or higher Severe or worsening pain at your incision site Hand pain or numbness.  Reduce your risk of vascular disease:  Stop smoking. If you would like help, call [facility] at 1-800-QUIT-NOW (671)150-0823) or [facility] [place] your cholesterol Maintain a desired weight Control your diabetes Keep your blood pressure down  Dialysis  It will take several weeks to several months for your new dialysis access to be ready for use. Your surgeon will determine when it is okay to use it. Your nephrologist will continue to direct your dialysis. You can continue to use your Permcath until your new access is ready for use.   04/18/2022 Jon Anderson [id number] 1951-02-11  Surgeon(s): Waynetta Sandy, MD  Procedure(s): RIGHT BRACHIOBASILIC FISTULA SECOND STAGE TRANSPOSITION   May stick graft immediately   May stick graft on designated area only:   X Do not stick right AV fistula for 6 weeks    If you have any questions, please call the office  at [phone].

## 2022-04-18 NOTE — Transfer of Care (Signed)
Immediate Anesthesia Transfer of Care Note  Patient: Jon Anderson  Procedure(s) Performed: RIGHT BRACHIOBASILIC FISTULA SECOND STAGE TRANSPOSITION (Right: Arm Upper)  Patient Location: PACU  Anesthesia Type:General  Level of Consciousness: drowsy and patient cooperative  Airway & Oxygen Therapy: Patient Spontanous Breathing and Patient connected to face mask oxygen  Post-op Assessment: Report given to RN and Post -op Vital signs reviewed and stable  Post vital signs: Reviewed and stable  Last Vitals:  Vitals Value Taken Time  BP 151/55 04/18/22 1357  Temp    Pulse 63 04/18/22 1358  Resp 15 04/18/22 1358  SpO2 100 % 04/18/22 1358  Vitals shown include unvalidated device data.  Last Pain:  Vitals:   04/18/22 1040  TempSrc:   PainSc: 0-No pain      Patients Stated Pain Goal: 3 (45/03/88 8280)  Complications: No notable events documented.

## 2022-04-19 ENCOUNTER — Encounter (HOSPITAL_COMMUNITY): Payer: Self-pay | Admitting: Vascular Surgery

## 2022-04-19 NOTE — Anesthesia Postprocedure Evaluation (Signed)
Anesthesia Post Note  Patient: DEMICHAEL TRAUM  Procedure(s) Performed: RIGHT BRACHIOBASILIC FISTULA SECOND STAGE TRANSPOSITION (Right: Arm Upper)     Patient location during evaluation: PACU Anesthesia Type: General Level of consciousness: awake and alert Pain management: pain level controlled Vital Signs Assessment: post-procedure vital signs reviewed and stable Respiratory status: spontaneous breathing, nonlabored ventilation, respiratory function stable and patient connected to nasal cannula oxygen Cardiovascular status: blood pressure returned to baseline and stable Postop Assessment: no apparent nausea or vomiting Anesthetic complications: no   No notable events documented.  Last Vitals:  Vitals:   04/18/22 1442 04/18/22 1457  BP: (!) 140/72 (!) 143/78  Pulse: 68 71  Resp: 14 15  Temp:  36.7 C  SpO2: 97% 95%    Last Pain:  Vitals:   04/18/22 1432  TempSrc:   PainSc: Tyler Deis

## 2022-04-21 ENCOUNTER — Telehealth: Payer: Self-pay

## 2022-04-21 NOTE — Telephone Encounter (Signed)
Pt called stating that he had surgery and his arm is swollen to his hand.  Pt called again, two identifiers used. Pt stated that his arm had been swollen since the surgery, but it's gotten slightly worse. No other symptoms. Pt has not been elevating properly. Gave pt instructions on proper elevation during the day and night. Confirmed understanding. Will call back if no relief.

## 2022-05-01 ENCOUNTER — Other Ambulatory Visit (HOSPITAL_COMMUNITY): Payer: Self-pay | Admitting: Nephrology

## 2022-05-01 ENCOUNTER — Telehealth: Payer: Self-pay

## 2022-05-01 DIAGNOSIS — N186 End stage renal disease: Secondary | ICD-10-CM

## 2022-05-01 DIAGNOSIS — Z992 Dependence on renal dialysis: Secondary | ICD-10-CM

## 2022-05-01 NOTE — Telephone Encounter (Signed)
Pt called c/o of severe swelling in his R arm that hasn't improved since surgery.  Reviewed pt's chart, returned call for clarification, two identifiers used. Pt stated that he's been elevating for 10-15 min approx 4 times a day. Informed him that he needs to keep his arm elevated almost constantly to improve the swelling. Pt denies any constant cold hands/fingers and no discoloration. Pt appt moved earlier and d/t transportation, he will drive himself to the appt on 1/24. Confirmed understanding.

## 2022-05-03 ENCOUNTER — Other Ambulatory Visit (HOSPITAL_COMMUNITY): Payer: Self-pay | Admitting: Interventional Radiology

## 2022-05-03 ENCOUNTER — Encounter (HOSPITAL_COMMUNITY): Payer: Medicare Other

## 2022-05-03 ENCOUNTER — Ambulatory Visit (HOSPITAL_COMMUNITY)
Admission: RE | Admit: 2022-05-03 | Discharge: 2022-05-03 | Disposition: A | Discharge status: Home / Self Care | Payer: Medicare Other | Source: Ambulatory Visit | Attending: Interventional Radiology | Admitting: Interventional Radiology

## 2022-05-03 DIAGNOSIS — T8241XA Breakdown (mechanical) of vascular dialysis catheter, initial encounter: Secondary | ICD-10-CM | POA: Diagnosis present

## 2022-05-03 DIAGNOSIS — Z992 Dependence on renal dialysis: Secondary | ICD-10-CM | POA: Diagnosis not present

## 2022-05-03 DIAGNOSIS — Y848 Other medical procedures as the cause of abnormal reaction of the patient, or of later complication, without mention of misadventure at the time of the procedure: Secondary | ICD-10-CM | POA: Diagnosis not present

## 2022-05-03 DIAGNOSIS — N186 End stage renal disease: Secondary | ICD-10-CM | POA: Insufficient documentation

## 2022-05-03 HISTORY — PX: IR FLUORO GUIDE CV LINE RIGHT: IMG2283

## 2022-05-03 MED ORDER — HEPARIN SODIUM (PORCINE) 1000 UNIT/ML IJ SOLN
INTRAMUSCULAR | Status: AC
  Administered 2022-05-03: 3.2 mL
  Filled 2022-05-03: qty 10

## 2022-05-03 MED ORDER — CEFAZOLIN SODIUM-DEXTROSE 2-4 GM/100ML-% IV SOLN
INTRAVENOUS | Status: AC
  Filled 2022-05-03: qty 100

## 2022-05-03 MED ORDER — SODIUM CHLORIDE 0.9 % IV SOLN: INTRAVENOUS | Status: DC

## 2022-05-03 MED ORDER — CEFAZOLIN SODIUM-DEXTROSE 2-4 GM/100ML-% IV SOLN
2.0000 g | Freq: Once | INTRAVENOUS | Status: AC
  Administered 2022-05-03: 2 g via INTRAVENOUS

## 2022-05-03 MED ORDER — LIDOCAINE HCL 1 % IJ SOLN
INTRAMUSCULAR | Status: AC
  Administered 2022-05-03: 20 mL
  Filled 2022-05-03: qty 20

## 2022-05-03 NOTE — H&P (Signed)
Patient Status: [facility] - Out-pt  Assessment and Plan: Patient in need of venous access. Current tunneled dialysis catheter is malfunctioning due to a crack in the catheter.   Jon Anderson, 72 year old male, presents today to the [facility] department for an image-guided tunneled dialysis catheter exchange.  Risks and benefits discussed with the patient including, but not limited to bleeding, infection, vascular injury, pneumothorax which may require chest tube placement, air embolism or even death  All of the patient's questions were answered, patient is agreeable to proceed.  Consent signed and in chart.  ______________________________________________________________________   History of Present Illness: Jon Anderson is a 72 y.o. male with a medical history significant for ESRD on hemodialysis. He is familiar to IR from placement of a tunneled dialysis catheter October 2023. He recently underwent creation of a right brachiobasilic fistula 05/14/38.  Our team received a call from the dialysis center today stating that there is a tiny hole in the HD catheter that is leaking blood during dialysis. IR asked to please exchange this line.   Allergies and medications reviewed.   Review of Systems: A 12 point ROS discussed and pertinent positives are indicated in the HPI above.  All other systems are negative.  Review of Systems  Constitutional:  Negative for appetite change and fatigue.  Respiratory:  Positive for shortness of breath. Negative for cough.   Cardiovascular:  Negative for chest pain and leg swelling.  Gastrointestinal:  Negative for abdominal pain, diarrhea, nausea and vomiting.  Musculoskeletal:  Negative for back pain.  Neurological:  Negative for dizziness and headaches.    Vital Signs: There were no vitals taken for this visit.  Physical Exam Constitutional:      General: He is not in acute distress.    Appearance: He is not  ill-appearing.  HENT:     Mouth/Throat:     Mouth: Mucous membranes are moist.     Pharynx: Oropharynx is clear.  Cardiovascular:     Comments: Right  upper arm fistula. Positive for thrill/bruit. Upper arm is tight/swollen. Incisions are still healing.  Pulmonary:     Effort: Pulmonary effort is normal.  Skin:    General: Skin is warm and dry.  Neurological:     General: No focal deficit present.     Mental Status: He is alert and oriented to person, place, and time.  Psychiatric:        Mood and Affect: Mood normal.        Behavior: Behavior normal.        Thought Content: Thought content normal.        Judgment: Judgment normal.      Imaging reviewed.   Labs:  COAGS: Recent Labs    01/20/22 1254  INR 1.0    BMP: Recent Labs    11/16/21 0303 11/17/21 0736 11/18/21 0853 11/26/21 0850 01/17/22 1138 01/20/22 1346 02/16/22 0722 04/18/22 1041  NA 128* 132* 134* 137 137 137 137 136  K 5.0 5.2* 3.5 4.0 3.9 4.0 3.6 3.8  CL 93* 93* 96* 95* 95* 98 97* 95*  CO2 19* '27 25 30  '$ --   --   --   --   GLUCOSE 246* 139* 129* 109* 111* 89 112* 117*  BUN 50* 34* 49* 21 35* 54* 23 24*  CALCIUM 9.4 8.8* 7.9* 9.1  --   --   --   --   CREATININE 10.52* 7.80* 9.36* 7.87* 9.80* 17.30* 7.50* 9.10*  GFRNONAA 5* 7* 5* 7*  --   --   --   --        Electronically Signed: Theresa Duty, NP 05/03/2022, 1:29 PM   I spent a total of 15 minutes in face to face in clinical consultation, greater than 50% of which was counseling/coordinating care for venous access.

## 2022-05-03 NOTE — Procedures (Signed)
Interventional Radiology Procedure Note  Procedure: Exchange of a right IJ approach tunneled HD cath.  19cm tip to cuff.  Tip is positioned at the superior cavoatrial junction and catheter is ready for immediate use.  Complications: None Recommendations:  - Ok to use - Do not submerge - Routine line care   Signed,  Dulcy Fanny. Earleen Newport, DO

## 2022-05-03 NOTE — Telephone Encounter (Signed)
Received a voicemail from [facility] requesting a [facility] exchange ASAP due to "blood squirting out the venous hub". They had sent an order for IR for waiting for reply.    Returned call to dialysis center and was informed IR was able to get patient scheduled for 1245 PM today for [facility] exchange and that patient should still be able to keep appointment at [facility] to evaluate swelling of right arm.

## 2022-05-04 ENCOUNTER — Ambulatory Visit (INDEPENDENT_AMBULATORY_CARE_PROVIDER_SITE_OTHER): Payer: Medicare Other | Admitting: Physician Assistant

## 2022-05-04 ENCOUNTER — Ambulatory Visit (HOSPITAL_BASED_OUTPATIENT_CLINIC_OR_DEPARTMENT_OTHER)
Admission: RE | Admit: 2022-05-04 | Discharge: 2022-05-04 | Disposition: A | Discharge status: Home / Self Care | Payer: Medicare Other | Source: Ambulatory Visit | Attending: Cardiovascular Disease | Admitting: Cardiovascular Disease

## 2022-05-04 VITALS — BP 124/69 | HR 93 | Temp 97.6°F | Ht 69.0 in | Wt 178.0 lb

## 2022-05-04 DIAGNOSIS — T8241XA Breakdown (mechanical) of vascular dialysis catheter, initial encounter: Secondary | ICD-10-CM | POA: Diagnosis not present

## 2022-05-04 DIAGNOSIS — N186 End stage renal disease: Secondary | ICD-10-CM

## 2022-05-04 DIAGNOSIS — I739 Peripheral vascular disease, unspecified: Secondary | ICD-10-CM | POA: Diagnosis not present

## 2022-05-04 LAB — VAS US ABI WITH/WO TBI

## 2022-05-04 MED ORDER — DOXYCYCLINE HYCLATE 100 MG PO CAPS: 100.0000 mg | ORAL_CAPSULE | Freq: Two times a day (BID) | ORAL | 0 refills | Status: AC

## 2022-05-04 NOTE — Progress Notes (Signed)
POST OPERATIVE OFFICE NOTE    CC:  F/u for surgery  HPI:  This is a 72 y.o. male who is s/p right arm second stage basilic vein transposition by Dr. Donzetta Matters on 04/18/2022.  He developed edema of the right arm postoperatively however states this is slowly improving he is dialyzing via right IJ [facility] on a Monday Wednesday Friday schedule.  Yesterday he had TDC exchanged due to leaking around the catheter by interventional radiology.  He denies any signs or symptoms of steal syndrome.  He believes the incisions are healing well.  Allergies  Allergen Reactions   Contrast Media [Iodinated Contrast Media] Anaphylaxis    Cardiac arrest, Throat swelling, hives, SOB   Kiwi Extract Itching, Swelling and Other (See Comments)    Lips and face swell- breathing not affected   Flexeril [Cyclobenzaprine]     Hands become flimsy, can not hold things   Tape Other (See Comments)    "Plastic" tape causes blisters!!    Current Outpatient Medications  Medication Sig Dispense Refill   acetaminophen (TYLENOL) 500 MG tablet Take 1,000 mg by mouth every 6 (six) hours as needed for moderate pain.     aspirin EC 81 MG tablet Take 1 tablet (81 mg total) by mouth daily. (Patient taking differently: Take 81 mg by mouth at bedtime.)     atorvastatin (LIPITOR) 40 MG tablet Take 1 tablet (40 mg total) by mouth daily at 6 PM. (Patient taking differently: Take 40 mg by mouth at bedtime.) 30 tablet 5   CALCITRIOL PO Take 1 tablet by mouth every Monday, Wednesday, and Friday.     calcium acetate (PHOSLO) 667 MG capsule Take 2 capsules (1,334 mg total) by mouth 2 (two) times daily with a meal. 180 capsule 0   clopidogrel (PLAVIX) 75 MG tablet Take 75 mg by mouth in the morning.     doxycycline (VIBRAMYCIN) 100 MG capsule Take 1 capsule (100 mg total) by mouth 2 (two) times daily for 7 days. 14 capsule 0   Etelcalcetide HCl (PARSABIV IV) Etelcalcetide (Parsabiv)     gabapentin (NEURONTIN) 100 MG capsule Take 100-200 mg by mouth  See admin instructions. 100 mg in the morning, 200 mg in the evening     HYDROcodone-acetaminophen (NORCO/VICODIN) 5-325 MG tablet Take 1 tablet by mouth every 6 (six) hours as needed for moderate pain. 16 tablet 0   loperamide (IMODIUM) 2 MG capsule Take 2 mg by mouth as needed for diarrhea or loose stools.     midodrine (PROAMATINE) 10 MG tablet Take 10 mg by mouth in the morning and at bedtime.     multivitamin (RENA-VIT) TABS tablet Take 1 tablet by mouth at bedtime. 30 tablet 0   ONETOUCH ULTRA test strip See admin instructions.     pantoprazole (PROTONIX) 40 MG tablet Take 40 mg by mouth daily before breakfast.     No current facility-administered medications for this visit.     ROS:  See HPI  Physical Exam:  Vitals:   05/04/22 1301  BP: 124/69  Pulse: 93  Temp: 97.6 F (36.4 C)  TempSrc: Temporal  SpO2: 98%  Weight: 178 lb (80.7 kg)  Height: '5\' 9"'$  (1.753 m)    Incision: Incisions are all well-appearing Extremities: Palpable radial pulse; palpable thrill through the fistula which comes somewhat pulsatile near the axilla Neuro: A&O  Assessment/Plan:  This is a 72 y.o. male who is s/p: Right second stage basilic vein transposition  -Right arm basilic vein fistula is patent  with a palpable thrill near the anastomosis which becomes more pulsatile near the axilla.  He does have edema of the right arm starting at the surgical site down to the hand.  This may be related to right IJ catheter placement.  He does also have some erythema of the upper arm.  He will be started on a 1 week doxycycline course.  We will also ask him to exercise his right hand with a stress ball.  He will also need to elevate his arm when possible during the day.  We applied a Ace wrap from his hand up to his shoulder.  We instructed him to do this on a daily basis.  He does not have to wear the Ace wrap at night.  He will follow-up in 2 weeks with a fistula duplex to make sure edema has improved prior to  cannulating the fistula.   Dagoberto Ligas, PA-C [facility] [phone]  Clinic MD:  Scot Dock

## 2022-05-10 ENCOUNTER — Other Ambulatory Visit: Payer: Self-pay

## 2022-05-10 DIAGNOSIS — N186 End stage renal disease: Secondary | ICD-10-CM

## 2022-05-16 ENCOUNTER — Ambulatory Visit (INDEPENDENT_AMBULATORY_CARE_PROVIDER_SITE_OTHER): Payer: Medicare Other | Admitting: Podiatry

## 2022-05-16 ENCOUNTER — Encounter: Payer: Self-pay | Admitting: Podiatry

## 2022-05-16 VITALS — BP 125/62 | HR 75 | Temp 98.0°F

## 2022-05-16 DIAGNOSIS — N186 End stage renal disease: Secondary | ICD-10-CM | POA: Diagnosis not present

## 2022-05-16 DIAGNOSIS — B351 Tinea unguium: Secondary | ICD-10-CM | POA: Diagnosis not present

## 2022-05-16 DIAGNOSIS — L84 Corns and callosities: Secondary | ICD-10-CM

## 2022-05-16 DIAGNOSIS — E1151 Type 2 diabetes mellitus with diabetic peripheral angiopathy without gangrene: Secondary | ICD-10-CM | POA: Diagnosis not present

## 2022-05-16 DIAGNOSIS — Z89431 Acquired absence of right foot: Secondary | ICD-10-CM | POA: Diagnosis not present

## 2022-05-16 NOTE — Progress Notes (Unsigned)
  Subjective:  Patient ID: Jon Anderson, male    DOB: March 19, 1951,  MRN: [id number]  Jon Anderson presents to clinic today for {jgcomplaint:23593}  Chief Complaint  Patient presents with   Follow-up    Diabetic foot care Patient is a diabetic B.S.am-n/a Last A1c-6.2 Lavone Neri, MD/ Last visit- Will see pcp on Thursday 05/18/22 Patient is taking all medications as prescribed.   New problem(s): None. {jgcomplaint:23593}  PCP is [name], MD.  Allergies  Allergen Reactions   Contrast Media [Iodinated Contrast Media] Anaphylaxis    Cardiac arrest, Throat swelling, hives, SOB   Kiwi Extract Itching, Swelling and Other (See Comments)    Lips and face swell- breathing not affected   Flexeril [Cyclobenzaprine]     Hands become flimsy, can not hold things   Tape Other (See Comments)    "Plastic" tape causes blisters!!    Review of Systems: Negative except as noted in the HPI.  Objective:  Vitals:   05/16/22 1433  BP: 125/62  Pulse: 75  Temp: 98 F (36.7 C)  SpO2: 94%   Jon Anderson is a pleasant 72 y.o. male {jgbodyhabitus:24098} AAO x 3.  Vascular Examination: He has adequate CFT to digits of left foot. Nonpalpable DP pulse(s) b/l LE. Nonpalpable PT pulse(s) b/l LE. Pedal hair absent. No pain with calf compression b/l. Lower extremity skin temperature gradient warm to warm.   Nonpitting edema RLE. Evidence of chronic venous insufficiency b/l LE. No ischemia or gangrene noted b/l LE. No cyanosis or clubbing noted b/l LE.  Dermatological Examination: No signs of acute bacterial infection noted. Pedal skin thin, shiny and atrophic b/l LE. No open wounds b/l LE. No interdigital macerations left foot. Toenails 1-5 left foot elongated, discolored, dystrophic, thickened, and crumbly with subungual debris and tenderness to dorsal palpation.   He has transverse healing abrasion lplantar aspect of left foot which is nearly healed. No erythema, no edema, no  drainage, no fluctuance.  Hyperkeratotic lesion(s) submet head 5 left foot, plantar aspect left hallux and distal stump of right LE.  No erythema, no edema, no drainage, no fluctuance, but he has some tenderness to plantar callus left foot.   Neurological Examination: Protective sensation diminished with 10g monofilament b/l.  Musculoskeletal Examination: Muscle strength 5/5 to all lower extremity muscle groups bilaterally.   Rigid hallux malleus right great toe.  Wearing extra depth diabetic shoes with prescribed diabetic inserts.   Palpable exostosis noted residual 5th metatarsal distally right stump. Lower extremity amputation(s): Transmetatarsal amputation right foot. Utilizes cane for ambulation assistance. Assessment/Plan: 1. Onychomycosis   2. Callus   3. S/P transmetatarsal amputation of foot, right ([place])   4. Type II diabetes mellitus with peripheral circulatory disorder (HCC)     No orders of the defined types were placed in this encounter.   None {Jgplan:23602::"-Patient/POA to call should there be question/concern in the interim."}   Return in about 3 months (around 08/14/2022).  Marzetta Board, DPM

## 2022-05-25 ENCOUNTER — Ambulatory Visit (HOSPITAL_COMMUNITY)
Admission: RE | Admit: 2022-05-25 | Discharge: 2022-05-25 | Disposition: A | Discharge status: Home / Self Care | Payer: Medicare Other | Source: Ambulatory Visit | Attending: Vascular Surgery | Admitting: Vascular Surgery

## 2022-05-25 ENCOUNTER — Ambulatory Visit (INDEPENDENT_AMBULATORY_CARE_PROVIDER_SITE_OTHER): Payer: Medicare Other | Admitting: Physician Assistant

## 2022-05-25 VITALS — BP 134/75 | HR 82 | Temp 97.5°F | Resp 20 | Ht 69.0 in | Wt 179.7 lb

## 2022-05-25 DIAGNOSIS — N186 End stage renal disease: Secondary | ICD-10-CM

## 2022-05-25 DIAGNOSIS — Z992 Dependence on renal dialysis: Secondary | ICD-10-CM

## 2022-05-26 NOTE — Progress Notes (Unsigned)
a POST OPERATIVE OFFICE NOTE    CC:  F/u for surgery  HPI:  Jon Anderson is a 72 y.o. male who is s/p right arm second stage basilic vein transposition by Dr. Donzetta Matters on 04/18/2022.  He currently dialyzes via right IJ TDC on Mondays, Wednesdays, and Fridays.  At his first postoperative visit on January 25, the patient said he developed some edema of the right arm postoperatively, however this continue to improve with time.  Pt returns today for follow up.  Pt states the swelling in his right arm has mostly gone away.  He denies any pain in his right arm or symptoms of steal syndrome.  His incisions in the right arm have healed well.   Allergies  Allergen Reactions   Contrast Media [Iodinated Contrast Media] Anaphylaxis    Cardiac arrest, Throat swelling, hives, SOB   Kiwi Extract Itching, Swelling and Other (See Comments)    Lips and face swell- breathing not affected   Flexeril [Cyclobenzaprine]     Hands become flimsy, can not hold things   Tape Other (See Comments)    "Plastic" tape causes blisters!!    Current Outpatient Medications  Medication Sig Dispense Refill   acetaminophen (TYLENOL) 500 MG tablet Take 1,000 mg by mouth every 6 (six) hours as needed for moderate pain.     aspirin EC 81 MG tablet Take 1 tablet (81 mg total) by mouth daily. (Patient taking differently: Take 81 mg by mouth at bedtime.)     atorvastatin (LIPITOR) 40 MG tablet Take 1 tablet (40 mg total) by mouth daily at 6 PM. (Patient taking differently: Take 40 mg by mouth at bedtime.) 30 tablet 5   CALCITRIOL PO Take 1 tablet by mouth every Monday, Wednesday, and Friday.     calcium acetate (PHOSLO) 667 MG capsule Take 2 capsules (1,334 mg total) by mouth 2 (two) times daily with a meal. 180 capsule 0   clopidogrel (PLAVIX) 75 MG tablet Take 75 mg by mouth in the morning.     Etelcalcetide HCl (PARSABIV IV) Etelcalcetide (Parsabiv)     gabapentin (NEURONTIN) 100 MG capsule Take 100-200 mg by mouth See admin  instructions. 100 mg in the morning, 200 mg in the evening     HYDROcodone-acetaminophen (NORCO/VICODIN) 5-325 MG tablet Take 1 tablet by mouth every 6 (six) hours as needed for moderate pain. 16 tablet 0   loperamide (IMODIUM) 2 MG capsule Take 2 mg by mouth as needed for diarrhea or loose stools.     midodrine (PROAMATINE) 10 MG tablet Take 10 mg by mouth in the morning and at bedtime.     multivitamin (RENA-VIT) TABS tablet Take 1 tablet by mouth at bedtime. 30 tablet 0   ONETOUCH ULTRA test strip See admin instructions.     pantoprazole (PROTONIX) 40 MG tablet Take 40 mg by mouth daily before breakfast.     No current facility-administered medications for this visit.     ROS:  See HPI  Physical Exam:  Incision: Right upper extremity incisions well-healed Extremities: Right basilic fistula with great thrill on palpation throughout the upper right arm.  Trace right bicep edema Neuro: intact motor and sensation of RUE   Assessment/Plan:  This is a 72 y.o. male who is s/p: Right upper extremity basilic vein transposition on 04/18/2022   -Right upper extremity incisions have well-healed.  Right upper extremity swelling has nearly gone away.  The fistula is easily palpable with a great thrill along the right upper arm -  AVF duplex demonstrates a patent fistula with no signs of stenosis.  No noticeable edema in the right arm noted on ultrasound.  Fistula size is well matured and nearly 0.6 cm throughout the entire arm -Dialysis can attempt to use this fistula starting on   [name], PA-C [facility] [phone]   Clinic MD:  ***

## 2022-05-29 ENCOUNTER — Other Ambulatory Visit (HOSPITAL_COMMUNITY): Payer: Self-pay | Admitting: Cardiovascular Disease

## 2022-05-29 DIAGNOSIS — I739 Peripheral vascular disease, unspecified: Secondary | ICD-10-CM

## 2022-06-05 ENCOUNTER — Other Ambulatory Visit: Payer: Self-pay

## 2022-06-05 ENCOUNTER — Telehealth: Payer: Self-pay

## 2022-06-05 DIAGNOSIS — N186 End stage renal disease: Secondary | ICD-10-CM

## 2022-06-05 NOTE — Telephone Encounter (Signed)
Pt called with c/o R hand/arm swelling and pain since this weekend. It was doing fine and they were going to access it for the first time today but did not due to swelling. He has been scheduled to see APP with duplex tomorrow. Pt is aware and verbalized understanding of his appts.

## 2022-06-06 ENCOUNTER — Other Ambulatory Visit (HOSPITAL_COMMUNITY): Payer: Medicare Other

## 2022-06-06 ENCOUNTER — Ambulatory Visit (HOSPITAL_COMMUNITY)
Admission: RE | Admit: 2022-06-06 | Discharge: 2022-06-06 | Disposition: A | Discharge status: Home / Self Care | Payer: Medicare Other | Source: Ambulatory Visit | Attending: Vascular Surgery | Admitting: Vascular Surgery

## 2022-06-06 ENCOUNTER — Ambulatory Visit (INDEPENDENT_AMBULATORY_CARE_PROVIDER_SITE_OTHER): Payer: Medicare Other | Admitting: Physician Assistant

## 2022-06-06 VITALS — BP 145/81 | HR 74 | Temp 97.7°F | Resp 20 | Ht 69.0 in | Wt 180.0 lb

## 2022-06-06 DIAGNOSIS — Z992 Dependence on renal dialysis: Secondary | ICD-10-CM | POA: Insufficient documentation

## 2022-06-06 DIAGNOSIS — N186 End stage renal disease: Secondary | ICD-10-CM | POA: Diagnosis present

## 2022-06-06 NOTE — Progress Notes (Signed)
POST OPERATIVE OFFICE NOTE    CC:  F/u for surgery  HPI:  This is a 72 y.o. 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 Donzetta Matters.   He comes in today with c/o right arm swelling.  He states that it has been swollen for a few weeks.  He states it may be a little better but he has swelling down into his hand.  He does elevate his arm.  He states he cannot have a fistulogram because his heart stopped when he had this done before.  Looking in his chart, he had cardiac arrest requiring CPR in August 2023 with fistulogram in IR.  He does not have any pain in his hand.  He does have some numbness in the right hand.   Dialysis access Hx: -Left BC AVF 03/19/2014 Dr. Kellie Simmering -revision left arm AVF with resection of threatened skin and plication [id number] Dr. Donnetta Hutching -revision plication of left arm AVF 03/23/2020 Dr. Donnetta Hutching -creation 1st stage left BVT 11/09/2020 Dr. Donzetta Matters -evacuation hematoma/thrombectomy left 1st stage BVT 11/09/2020 Dr.  Scot Dock -2nd stage BVT 12/14/2020 Dr. Donzetta Matters -balloon assisted maturation left arm AVF 01/10/2021 Dr. Donzetta Matters -left arm AVF revision with interposition AVG 02/15/2021 Dr. Donzetta Matters -venogram with angioplasty upper arm AVG anastomosis and arterial anastomosis 05/19/2021 Dr. Donzetta Matters -right arm 1st stage BVT 02/16/2022 Dr. Donzetta Matters -2nd stage BVT 04/18/2022 Dr. Donzetta Matters  He also has hx of stenting of right popliteal artery, balloon angioplasty of right ATA on 08/21/2019 by Dr. Donzetta Matters.   Carotids and lower arterial studies followed by Dr. Gwenlyn Found.   The pt is on dialysis M/W/F at [facility] location.   Allergies  Allergen Reactions   Contrast Media [Iodinated Contrast Media] Anaphylaxis    Cardiac arrest, Throat swelling, hives, SOB   Kiwi Extract Itching, Swelling and Other (See Comments)    Lips and face swell- breathing not affected   Flexeril [Cyclobenzaprine]     Hands become flimsy, can not hold things   Tape Other (See Comments)    "Plastic" tape causes blisters!!    Current  Outpatient Medications  Medication Sig Dispense Refill   acetaminophen (TYLENOL) 500 MG tablet Take 1,000 mg by mouth every 6 (six) hours as needed for moderate pain.     aspirin EC 81 MG tablet Take 1 tablet (81 mg total) by mouth daily. (Patient taking differently: Take 81 mg by mouth at bedtime.)     atorvastatin (LIPITOR) 40 MG tablet Take 1 tablet (40 mg total) by mouth daily at 6 PM. (Patient taking differently: Take 40 mg by mouth at bedtime.) 30 tablet 5   CALCITRIOL PO Take 1 tablet by mouth every Monday, Wednesday, and Friday.     calcium acetate (PHOSLO) 667 MG capsule Take 2 capsules (1,334 mg total) by mouth 2 (two) times daily with a meal. 180 capsule 0   clopidogrel (PLAVIX) 75 MG tablet Take 75 mg by mouth in the morning.     Etelcalcetide HCl (PARSABIV IV) Etelcalcetide (Parsabiv)     gabapentin (NEURONTIN) 100 MG capsule Take 100-200 mg by mouth See admin instructions. 100 mg in the morning, 200 mg in the evening     HYDROcodone-acetaminophen (NORCO/VICODIN) 5-325 MG tablet Take 1 tablet by mouth every 6 (six) hours as needed for moderate pain. 16 tablet 0   loperamide (IMODIUM) 2 MG capsule Take 2 mg by mouth as needed for diarrhea or loose stools.     midodrine (PROAMATINE) 10 MG tablet Take 10 mg  by mouth in the morning and at bedtime.     multivitamin (RENA-VIT) TABS tablet Take 1 tablet by mouth at bedtime. 30 tablet 0   ONETOUCH ULTRA test strip See admin instructions.     pantoprazole (PROTONIX) 40 MG tablet Take 40 mg by mouth daily before breakfast.     No current facility-administered medications for this visit.     ROS:  See HPI  Physical Exam:  Today's Vitals   06/06/22 1117  BP: (!) 145/81  Pulse: 74  Resp: 20  Temp: 97.7 F (36.5 C)  TempSrc: Temporal  SpO2: 95%  Weight: 180 lb (81.6 kg)  Height: '5\' 9"'$  (1.753 m)   Body mass index is 26.58 kg/m.   Incision:  all incisions have healed nicely Extremities:   There is a palpable right radial pulse.    Motor and sensory are in tact.   There is an excellent thrill/bruit present.  Access is  easily palpable   Assessment/Plan:  This is a 72 y.o. 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 Donzetta Matters  -pt's right arm fistula can be accessed now.  Reviewed duplex with Dr. Carlis Abbott.  If AVF works well, his catheter can be removed, which is most likely contributing to his right arm swelling.  If the fistula does not work well, he would need a fistulogram to evaluate his fistula.  He is hesitant to do this procedure due to having cardiac arrest last year with IR during fistulogram.   -the pt will follow up as needed   Leontine Locket, [facility] [phone]  Clinic MD:  Carlis Abbott

## 2022-06-22 ENCOUNTER — Other Ambulatory Visit (HOSPITAL_COMMUNITY): Payer: Self-pay | Admitting: Nephrology

## 2022-06-22 DIAGNOSIS — Z992 Dependence on renal dialysis: Secondary | ICD-10-CM

## 2022-07-03 ENCOUNTER — Ambulatory Visit (INDEPENDENT_AMBULATORY_CARE_PROVIDER_SITE_OTHER): Payer: Medicare Other

## 2022-07-03 ENCOUNTER — Ambulatory Visit (HOSPITAL_COMMUNITY)
Admission: EM | Admit: 2022-07-03 | Discharge: 2022-07-03 | Disposition: A | Discharge status: Home / Self Care | Payer: Medicare Other | Attending: Family Medicine | Admitting: Family Medicine

## 2022-07-03 ENCOUNTER — Encounter (HOSPITAL_COMMUNITY): Payer: Self-pay

## 2022-07-03 DIAGNOSIS — I10 Essential (primary) hypertension: Secondary | ICD-10-CM | POA: Diagnosis not present

## 2022-07-03 DIAGNOSIS — J069 Acute upper respiratory infection, unspecified: Secondary | ICD-10-CM | POA: Diagnosis not present

## 2022-07-03 DIAGNOSIS — R059 Cough, unspecified: Secondary | ICD-10-CM | POA: Diagnosis not present

## 2022-07-03 DIAGNOSIS — R0602 Shortness of breath: Secondary | ICD-10-CM | POA: Diagnosis not present

## 2022-07-03 MED ORDER — HYDROCODONE BIT-HOMATROP MBR 5-1.5 MG/5ML PO SOLN: 5.0000 mL | Freq: Four times a day (QID) | ORAL | 0 refills | Status: DC | PRN

## 2022-07-03 NOTE — ED Triage Notes (Signed)
Pt cough, SOB, nasal congestion , low energy, chills, x 4days the patient tried Daquil as per pt stated it helped some.

## 2022-07-03 NOTE — ED Notes (Signed)
Informed provider pt 0/2 of 94%

## 2022-07-03 NOTE — Discharge Instructions (Addendum)
Your blood pressure was noted to be elevated during your visit today. If you are currently taking medication for high blood pressure, please ensure you are taking this as directed. If you do not have a history of high blood pressure and your blood pressure remains persistently elevated, you may need to begin taking a medication at some point. You may return here within the next few days to recheck if unable to see your primary care provider or if you do not have a one.  BP (!) 156/85 (BP Location: Left Arm)   Pulse 80   Temp 98 F (36.7 C) (Oral)   Resp 16   SpO2 94%   BP Readings from Last 3 Encounters:  07/03/22 (!) 156/85  06/06/22 (!) 145/81  05/25/22 134/75   Be aware, your cough medication may cause drowsiness. Please do not drive, operate heavy machinery or make important decisions while on this medication, it can cloud your judgement.

## 2022-07-05 NOTE — ED Provider Notes (Signed)
[name]   [id number] 07/03/22 Arrival Time: [id number]  ASSESSMENT & PLAN:  1. Viral URI with cough   2. SOB (shortness of breath)   3. Elevated blood pressure reading with diagnosis of hypertension    I have personally viewed and independently interpreted the imaging studies ordered this visit. No acute changes on CXR. No infiltrates. No pneumothorax.  No respiratory distress. Discussed typical duration of likely viral illness. OTC symptom care as needed.  Discharge Medication List as of 07/03/2022 11:02 AM     START taking these medications   Details  HYDROcodone bit-homatropine (HYCODAN) 5-1.5 MG/5ML syrup Take 5 mLs by mouth every 6 (six) hours as needed for cough., Starting Mon 07/03/2022, Normal         Discharge Instructions      Your blood pressure was noted to be elevated during your visit today. If you are currently taking medication for high blood pressure, please ensure you are taking this as directed. If you do not have a history of high blood pressure and your blood pressure remains persistently elevated, you may need to begin taking a medication at some point. You may return here within the next few days to recheck if unable to see your primary care provider or if you do not have a one.  BP (!) 156/85 (BP Location: Left Arm)   Pulse 80   Temp 98 F (36.7 C) (Oral)   Resp 16   SpO2 94%   BP Readings from Last 3 Encounters:  07/03/22 (!) 156/85  06/06/22 (!) 145/81  05/25/22 134/75   Be aware, your cough medication may cause drowsiness. Please do not drive, operate heavy machinery or make important decisions while on this medication, it can cloud your judgement.        Follow-up Information     Schedule an appointment as soon as possible for a visit  with [name], MD.   Specialty: Internal Medicine Why: For follow up and to recheck your blood pressure. Contact information: [address] [place] [address] [phone]                  Reviewed expectations re: course of current medical issues. Questions answered. Outlined signs and symptoms indicating need for more acute intervention. Understanding verbalized. After Visit Summary given.   SUBJECTIVE: History from: Patient. Jon Anderson is a 72 y.o. male. Reports: Pt cough, SOB, nasal congestion , low energy, chills, x 4days the patient tried Daquil as per pt stated it helped some.  Denies: fever. Normal PO intake without n/v/d.  Social History   Tobacco Use  Smoking Status Former   Packs/day: 1.00   Years: 2.00   Additional pack years: 0.00   Total pack years: 2.00   Types: Cigarettes   Quit date: 1978   Years since quitting: 46.2   Passive exposure: Never  Smokeless Tobacco Never    Increased blood pressure noted today. Reports that he is treated for HTN.  He reports taking medications as instructed, no chest pain on exertion, no dyspnea on exertion, no swelling of ankles, no orthostatic dizziness or lightheadedness, and no orthopnea or paroxysmal nocturnal dyspnea.  OBJECTIVE:  Vitals:   07/03/22 0935  BP: (!) 156/85  Pulse: 80  Resp: 16  Temp: 98 F (36.7 C)  TempSrc: Oral  SpO2: 94%    General appearance: alert; no distress Eyes: PERRLA; EOMI; conjunctiva normal HENT: ; AT; with nasal congestion Neck: supple  Lungs: speaks full sentences without  difficulty; unlabored Extremities: no edema Skin: warm and dry Neurologic: normal gait Psychological: alert and cooperative; normal mood and affect   Imaging: DG Chest 2 View  Result Date: 07/03/2022 CLINICAL DATA:  Cough with shortness of breath and nasal congestion for 4 days. EXAM: CHEST - 2 VIEW COMPARISON:  Radiographs 01/20/2022 and 11/26/2021.  CT 07/17/2018. FINDINGS: Right IJ central venous catheter projects to the mid SVC level. The heart size and mediastinal contours are stable with aortic atherosclerosis. Mild chronic central airway thickening without  evidence of superimposed edema, airspace disease, pleural effusion or pneumothorax. No acute osseous findings are evident. IMPRESSION: No evidence of acute cardiopulmonary process. Chronic central airway thickening. Electronically Signed   By: Richardean Sale M.D.   On: 07/03/2022 10:14    Allergies  Allergen Reactions   Contrast Media [Iodinated Contrast Media] Anaphylaxis    Cardiac arrest, Throat swelling, hives, SOB   Kiwi Extract Itching, Swelling and Other (See Comments)    Lips and face swell- breathing not affected   Flexeril [Cyclobenzaprine]     Hands become flimsy, can not hold things   Tape Other (See Comments)    "Plastic" tape causes blisters!!    Past Medical History:  Diagnosis Date   Anemia of chronic disease    Arthritis    CAD (coronary artery disease)    Carotid stenosis    a. [id number] RICA, [id number] LICA by duplex Q000111Q   Chronic chest pain    occ   Constipation    chronic   Dyspnea    occasional with extertion   ESRD (end stage renal disease) on dialysis (HCC)    M-W-F- [name]   GERD (gastroesophageal reflux disease)    History of hiatal hernia    History of kidney stones    Passed   HNP (herniated nucleus pulposus), lumbar    HTN (hypertension)    echo 3/10: EF 60%, LAE   Hyperlipidemia    Mitral stenosis    mild MS 11/15/21   Nephrolithiasis    "passed them all"   PEA (Pulseless electrical activity) ([place]) 11/15/2021   PEA arrest felt likely related to contrast anaphylaxis despite premedication   Peripheral arterial disease (HCC)    a. s/p PTCA  right    Pneumonia yrs ago   Restless legs    Sciatic leg pain    Sleep apnea    does not use cpap   Snores    a. presumed OSA, pt has refused sleep eval in past.   Type II diabetes mellitus (HCC)    no longer on medications, checks blood glucose at home   Urinary frequency    Uses wheelchair    Walker as ambulation aid    Social History   Socioeconomic History   Marital status: Married    Spouse  name: Not on file   Number of children: Not on file   Years of education: Not on file   Highest education level: Not on file  Occupational History   Occupation: retired  Tobacco Use   Smoking status: Former    Packs/day: 1.00    Years: 2.00    Additional pack years: 0.00    Total pack years: 2.00    Types: Cigarettes    Quit date: 1978    Years since quitting: 46.2    Passive exposure: Never   Smokeless tobacco: Never  Vaping Use   Vaping Use: Never used  Substance and Sexual Activity   Alcohol use: Not Currently  Alcohol/week: 0.0 standard drinks of alcohol    Comment: h/o social drinking   Drug use: Not Currently    Types: Marijuana    Comment: quit 1986   Sexual activity: Yes    Birth control/protection: None  Other Topics Concern   Not on file  Social History Narrative   The patient is a Retail buyer.  He is married and has 2 grown children.  Lives in [place] with his wife.  Denies tobacco, alcohol or IV drug abuse or marijuana or cocaine intake.    Social Determinants of Health   Financial Resource Strain: Not on file  Food Insecurity: Not on file  Transportation Needs: Not on file  Physical Activity: Not on file  Stress: Not on file  Social Connections: Not on file  Intimate Partner Violence: Not on file   Family History  Problem Relation Age of Onset   Heart attack Sister        died @ 64   Cancer Mother        died @ 63; unknown type   Diabetes Brother        deceased   Cirrhosis Father        alcohol related   Diabetes Father    Esophageal cancer Neg Hx    Colon cancer Neg Hx    Pancreatic cancer Neg Hx    Stomach cancer Neg Hx    Past Surgical History:  Procedure Laterality Date   A/V FISTULAGRAM Left 01/10/2021   Procedure: A/V FISTULAGRAM;  Surgeon: Waynetta Sandy, MD;  Location: [place] [facility];  Service: Cardiovascular;  Laterality: Left;   A/V FISTULAGRAM Left 05/19/2021   Procedure: A/V Fistulagram;  Surgeon: Marty Heck,  MD;  Location: [place] [facility];  Service: Cardiovascular;  Laterality: Left;   ABDOMINAL AORTOGRAM W/LOWER EXTREMITY Bilateral 08/21/2019   Procedure: ABDOMINAL AORTOGRAM W/LOWER EXTREMITY;  Surgeon: Waynetta Sandy, MD;  Location: [place] [facility];  Service: Cardiovascular;  Laterality: Bilateral;   AMPUTATION Right 07/25/2019   Procedure: RIGHT 5th RAY AMPUTATION;  Surgeon: Newt Minion, MD;  Location: [place];  Service: Orthopedics;  Laterality: Right;   AMPUTATION Right 11/19/2019   Procedure: RIGHT TRANSMETATARSAL AMPUTATION;  Surgeon: Newt Minion, MD;  Location: [place];  Service: Orthopedics;  Laterality: Right;   ANGIOPLASTY / STENTING FEMORAL Left 12/11/2013   dr berry   AV FISTULA PLACEMENT Left 03/19/2014   Procedure: CREATION OF ARTERIOVENOUS (AV) FISTULA  LEFT UPPER ARM;  Surgeon: Mal Misty, MD;  Location: [place];  Service: Vascular;  Laterality: Left;   AV FISTULA PLACEMENT Left 11/09/2020   Procedure: LEFT ARM ARTERIOVENOUS (AV) FISTULA CREATION;  Surgeon: Waynetta Sandy, MD;  Location: [place];  Service: Vascular;  Laterality: Left;   AV FISTULA PLACEMENT Left 02/15/2021   Procedure: INSERTION OF LEFT ARM ARTERIOVENOUS GORE-TEX GRAFT;  Surgeon: Waynetta Sandy, MD;  Location: [place];  Service: Vascular;  Laterality: Left;   AV FISTULA PLACEMENT Right 02/16/2022   Procedure: RIGHT ARM [place] (AV) FISTULA CREATION;  Surgeon: Waynetta Sandy, MD;  Location: [place];  Service: Vascular;  Laterality: Right;  [facility]   BACK SURGERY  01/2018   screws placed    [place] Left 12/14/2020   Procedure: REVISION OF LEFT ARM SECOND STAGE BASILIC VEIN TRANSPOSITION;  Surgeon: Waynetta Sandy, MD;  Location: [place];  Service: Vascular;  Laterality: Left;   [place] Right 04/18/2022  Procedure: RIGHT BRACHIOBASILIC FISTULA SECOND STAGE TRANSPOSITION;  Surgeon: Waynetta Sandy, MD;  Location: [place];  Service: Vascular;  Laterality: Right;   CARDIAC CATHETERIZATION  2001 and 2010    COLONOSCOPY W/ BIOPSIES AND POLYPECTOMY     COLONOSCOPY WITH PROPOFOL N/A 08/01/2016   Procedure: COLONOSCOPY WITH PROPOFOL;  Surgeon: Carol Ada, MD;  Location: [facility];  Service: Endoscopy;  Laterality: N/A;   COLONOSCOPY WITH PROPOFOL N/A 01/17/2022   Procedure: COLONOSCOPY WITH PROPOFOL;  Surgeon: Carol Ada, MD;  Location: [facility];  Service: Gastroenterology;  Laterality: N/A;   CORONARY ARTERY BYPASS GRAFT  2019   [facility] x [facility] bypass   ESOPHAGOGASTRODUODENOSCOPY (EGD) WITH PROPOFOL N/A 08/01/2016   Procedure: ESOPHAGOGASTRODUODENOSCOPY (EGD) WITH PROPOFOL;  Surgeon: Carol Ada, MD;  Location: [facility];  Service: Endoscopy;  Laterality: N/A;   FISTULA SUPERFICIALIZATION Left 02/10/2020   Procedure: 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;  Surgeon: Rosetta Posner, MD;  Location: [place];  Service: Vascular;  Laterality: Left;   FISTULA SUPERFICIALIZATION Left 03/23/2020   Procedure: LEFT UPPER EXTREMITY ARTERIOVENOUS FISTULA PLICATION;  Surgeon: Rosetta Posner, MD;  Location: [place];  Service: Vascular;  Laterality: Left;   FOOT FRACTURE SURGERY Right    ligament repair   FRACTURE SURGERY     left forearm   GRAFT APPLICATION Right [id number]   Procedure: FAT GRAFT APPLICATION;  Surgeon: Evelina Bucy, DPM;  Location: [place];  Service: Podiatry;  Laterality: Right;   HEMATOMA EVACUATION Left 11/09/2020   Procedure: EVACUATION HEMATOMA LEFT ARM;  Surgeon: Angelia Mould, MD;  Location: [facility];  Service: Vascular;  Laterality: Left;   INGUINAL HERNIA REPAIR Left    IR AV DIALY SHUNT INTRO NEEDLE/INTRACATH INITIAL W/PTA/IMG LEFT  11/15/2021   IR FLUORO GUIDE CV LINE RIGHT  01/20/2022   IR FLUORO GUIDE CV LINE RIGHT  05/03/2022   IR REMOVAL TUN CV CATH W/O FL  06/16/2021   IR US GUIDE VASC ACCESS LEFT  11/15/2021   IR US  GUIDE VASC ACCESS LEFT  11/15/2021   IR US GUIDE VASC ACCESS RIGHT  11/15/2021   IR US GUIDE VASC ACCESS RIGHT  01/20/2022   LEFT HEART CATH AND CORS/GRAFTS ANGIOGRAPHY N/A 04/27/2017   Procedure: LEFT HEART CATH AND CORS/GRAFTS ANGIOGRAPHY;  Surgeon: Leonie Man, MD;  Location: [place] [facility];  Service: Cardiovascular;  Laterality: N/A;   LEFT HEART CATHETERIZATION WITH CORONARY ANGIOGRAM N/A 06/22/2014   Procedure: LEFT HEART CATHETERIZATION WITH CORONARY ANGIOGRAM;  Surgeon: Troy Sine, MD;  Location: [facility];  Service: Cardiovascular;  Laterality: N/A;   LOWER EXTREMITY ANGIOGRAM Left 12/11/2013   Procedure: LOWER EXTREMITY ANGIOGRAM;  Surgeon: Lorretta Harp, MD;  Location: [facility];  Service: Cardiovascular;  Laterality: Left;   LUMBAR LAMINECTOMY/DECOMPRESSION MICRODISCECTOMY Right 07/03/2017   Procedure: MICRODISCECTOMY LUMBAR FIVE - SACRAL ONE RIGHT;  Surgeon: Consuella Lose, MD;  Location: [place];  Service: Neurosurgery;  Laterality: Right;   LUMBAR LAMINECTOMY/DECOMPRESSION MICRODISCECTOMY Right 10/19/2017   Procedure: MICRODISCECTOMY LUMBAR FIVE- SACRAL 1 ONE ;  Surgeon: Consuella Lose, MD;  Location: [place];  Service: Neurosurgery;  Laterality: Right;   PERIPHERAL VASCULAR BALLOON ANGIOPLASTY Left 05/19/2021   Procedure: PERIPHERAL VASCULAR BALLOON ANGIOPLASTY;  Surgeon: Marty Heck, MD;  Location: [place] [facility];  Service: Cardiovascular;  Laterality: Left;   PERIPHERAL VASCULAR INTERVENTION Left 01/10/2021   Procedure: PERIPHERAL VASCULAR INTERVENTION;  Surgeon: Waynetta Sandy, MD;  Location: [place]  [facility];  Service: Cardiovascular;  Laterality: Left;   POLYPECTOMY  01/17/2022   Procedure: POLYPECTOMY;  Surgeon: Carol Ada, MD;  Location: [facility];  Service: Gastroenterology;;   THROMBECTOMY W/ EMBOLECTOMY Left 11/09/2020   Procedure: THROMBECTOMY OF LEFT ARTERIOVENOUS FISTULA;  Surgeon: Angelia Mould, MD;  Location: [place];   Service: Vascular;  Laterality: Left;   TONSILLECTOMY AND ADENOIDECTOMY     WISDOM TOOTH EXTRACTION     WOUND DEBRIDEMENT Right 05/13/2019   Procedure: [place];  Surgeon: Evelina Bucy, DPM;  Location: [place];  Service: Podiatry;  Laterality: Right;     Vanessa Kick, MD 07/05/22 [phone]

## 2022-07-06 ENCOUNTER — Ambulatory Visit (HOSPITAL_COMMUNITY)
Admission: RE | Admit: 2022-07-06 | Discharge: 2022-07-06 | Disposition: A | Discharge status: Home / Self Care | Payer: Medicare Other | Source: Ambulatory Visit | Attending: Nephrology | Admitting: Nephrology

## 2022-07-06 DIAGNOSIS — N186 End stage renal disease: Secondary | ICD-10-CM

## 2022-07-06 DIAGNOSIS — Z4901 Encounter for fitting and adjustment of extracorporeal dialysis catheter: Secondary | ICD-10-CM | POA: Insufficient documentation

## 2022-07-06 HISTORY — PX: IR REMOVAL TUN CV CATH W/O FL: IMG2289

## 2022-07-06 MED ORDER — LIDOCAINE HCL 1 % IJ SOLN
INTRAMUSCULAR | Status: AC
  Administered 2022-07-06: 10 mL
  Filled 2022-07-06: qty 20

## 2022-07-17 ENCOUNTER — Telehealth: Payer: Self-pay | Admitting: Orthopedic Surgery

## 2022-07-18 ENCOUNTER — Ambulatory Visit (INDEPENDENT_AMBULATORY_CARE_PROVIDER_SITE_OTHER): Payer: Medicare Other | Admitting: Orthopedic Surgery

## 2022-07-18 ENCOUNTER — Other Ambulatory Visit (INDEPENDENT_AMBULATORY_CARE_PROVIDER_SITE_OTHER): Payer: Medicare Other

## 2022-07-18 DIAGNOSIS — M25561 Pain in right knee: Secondary | ICD-10-CM

## 2022-07-18 DIAGNOSIS — M25551 Pain in right hip: Secondary | ICD-10-CM | POA: Diagnosis not present

## 2022-07-18 DIAGNOSIS — I739 Peripheral vascular disease, unspecified: Secondary | ICD-10-CM | POA: Diagnosis not present

## 2022-07-22 ENCOUNTER — Emergency Department (HOSPITAL_COMMUNITY): Payer: Medicare Other

## 2022-07-22 ENCOUNTER — Emergency Department (HOSPITAL_COMMUNITY)
Admission: EM | Admit: 2022-07-22 | Discharge: 2022-07-22 | Disposition: A | Discharge status: Home / Self Care | Payer: Medicare Other | Attending: Emergency Medicine | Admitting: Emergency Medicine

## 2022-07-22 ENCOUNTER — Other Ambulatory Visit: Payer: Self-pay

## 2022-07-22 DIAGNOSIS — Z7902 Long term (current) use of antithrombotics/antiplatelets: Secondary | ICD-10-CM | POA: Diagnosis not present

## 2022-07-22 DIAGNOSIS — E1122 Type 2 diabetes mellitus with diabetic chronic kidney disease: Secondary | ICD-10-CM | POA: Insufficient documentation

## 2022-07-22 DIAGNOSIS — Z7982 Long term (current) use of aspirin: Secondary | ICD-10-CM | POA: Insufficient documentation

## 2022-07-22 DIAGNOSIS — M545 Low back pain, unspecified: Secondary | ICD-10-CM | POA: Diagnosis present

## 2022-07-22 DIAGNOSIS — I120 Hypertensive chronic kidney disease with stage 5 chronic kidney disease or end stage renal disease: Secondary | ICD-10-CM | POA: Insufficient documentation

## 2022-07-22 DIAGNOSIS — R0602 Shortness of breath: Secondary | ICD-10-CM | POA: Insufficient documentation

## 2022-07-22 DIAGNOSIS — M5416 Radiculopathy, lumbar region: Secondary | ICD-10-CM | POA: Diagnosis not present

## 2022-07-22 DIAGNOSIS — I2510 Atherosclerotic heart disease of native coronary artery without angina pectoris: Secondary | ICD-10-CM | POA: Insufficient documentation

## 2022-07-22 DIAGNOSIS — Z992 Dependence on renal dialysis: Secondary | ICD-10-CM | POA: Insufficient documentation

## 2022-07-22 DIAGNOSIS — R000 Tachycardia, unspecified: Secondary | ICD-10-CM | POA: Diagnosis not present

## 2022-07-22 DIAGNOSIS — N186 End stage renal disease: Secondary | ICD-10-CM | POA: Insufficient documentation

## 2022-07-22 DIAGNOSIS — K5900 Constipation, unspecified: Secondary | ICD-10-CM | POA: Diagnosis not present

## 2022-07-22 LAB — CBC
HCT: 34.9 % — ABNORMAL LOW (ref 39.0–52.0)
Hemoglobin: 10.9 g/dL — ABNORMAL LOW (ref 13.0–17.0)
MCH: 28.2 pg (ref 26.0–34.0)
MCHC: 31.2 g/dL (ref 30.0–36.0)
MCV: 90.4 fL (ref 80.0–100.0)
Platelets: 272 10*3/uL (ref 150–400)
RBC: 3.86 MIL/uL — ABNORMAL LOW (ref 4.22–5.81)
RDW: 15.2 % (ref 11.5–15.5)
WBC: 7.1 10*3/uL (ref 4.0–10.5)
nRBC: 0 % (ref 0.0–0.2)

## 2022-07-22 LAB — BASIC METABOLIC PANEL
Anion gap: 16 — ABNORMAL HIGH (ref 5–15)
BUN: 26 mg/dL — ABNORMAL HIGH (ref 8–23)
CO2: 29 mmol/L (ref 22–32)
Calcium: 8.2 mg/dL — ABNORMAL LOW (ref 8.9–10.3)
Chloride: 90 mmol/L — ABNORMAL LOW (ref 98–111)
Creatinine, Ser: 8.52 mg/dL — ABNORMAL HIGH (ref 0.61–1.24)
GFR, Estimated: 6 mL/min — ABNORMAL LOW (ref >60)
Glucose, Bld: 132 mg/dL — ABNORMAL HIGH (ref 70–99)
Potassium: 3.5 mmol/L (ref 3.5–5.1)
Sodium: 135 mmol/L (ref 135–145)

## 2022-07-22 LAB — C-REACTIVE PROTEIN: CRP: 0.7 mg/dL (ref <1.0)

## 2022-07-22 LAB — TROPONIN I (HIGH SENSITIVITY)
Troponin I (High Sensitivity): 48 ng/L — ABNORMAL HIGH (ref <18)
Troponin I (High Sensitivity): 42 ng/L — ABNORMAL HIGH (ref <18)

## 2022-07-22 LAB — SEDIMENTATION RATE: Sed Rate: 25 mm/hr — ABNORMAL HIGH (ref 0–16)

## 2022-07-22 MED ORDER — LIDOCAINE 5 % EX PTCH
1.0000 | MEDICATED_PATCH | CUTANEOUS | Status: DC
  Administered 2022-07-22: 1 via TRANSDERMAL
  Filled 2022-07-22: qty 1

## 2022-07-22 MED ORDER — OXYCODONE-ACETAMINOPHEN 5-325 MG PO TABS: 1.0000 | ORAL_TABLET | Freq: Four times a day (QID) | ORAL | 0 refills | Status: DC | PRN

## 2022-07-22 MED ORDER — LIDOCAINE 5 % EX PTCH: 1.0000 | MEDICATED_PATCH | CUTANEOUS | 0 refills | Status: DC

## 2022-07-22 MED ORDER — METHOCARBAMOL 500 MG PO TABS
500.0000 mg | ORAL_TABLET | Freq: Once | ORAL | Status: AC
  Administered 2022-07-22: 500 mg via ORAL
  Filled 2022-07-22: qty 1

## 2022-07-22 MED ORDER — OXYCODONE-ACETAMINOPHEN 5-325 MG PO TABS
1.0000 | ORAL_TABLET | Freq: Once | ORAL | Status: AC
  Administered 2022-07-22: 1 via ORAL
  Filled 2022-07-22: qty 1

## 2022-07-22 NOTE — Discharge Instructions (Addendum)
All your blood test and x-rays/MRI look okay today.  No signs of pneumonia or an infection in your back.  Looks like you may have a pinched nerve causing your pain.  Use your walker as needed while your leg is bothering you.  If you start having high fevers, vomiting, confusion, severe abdominal pain or inability to breathe you should return to the emergency room immediately.  If you suddenly cannot lift your legs or both of your legs you also need to return to the ER.  Continue your current medications as prescribed but do not start the antibiotic.  Also since you are getting a prescription for pain medication today do not mix it with the cough syrup you received this week.

## 2022-07-22 NOTE — ED Provider Notes (Signed)
[place] EMERGENCY DEPARTMENT AT [facility] Provider Note   CSN: [id number] Arrival date & time: 07/22/22  0900     History  Chief Complaint  Patient presents with   Back Pain   Shortness of Breath    GIVEN CHIDESTER is a 71 y.o. male.  Patient is a 72 year old male with a history of hypertension, hyperlipidemia, end-stage renal disease on dialysis, CAD, diabetes, PVD status post stenting of the right popliteal, multiple back surgeries last laminectomy and decompression with microdiscectomy was in 2019 who is presenting today with 1 week of worsening back pain and weakness in his right leg.  Patient reports also for about a week he has had cough, congestion and low-grade temperatures with the highest being 100.  He denies any productive cough, nausea or vomiting.  He does not make urine regularly but also complains of constipation which is a chronic problem.  Patient reports the pain started as a pain in his right hip going down his leg but is also moved now to be in the right side of his back.  For the last 2 days it has been so uncomfortable he is having difficulty sleeping.  He saw his doctor 2 days ago for this and was given hydrocodone cough syrup and an antibiotic doxycycline for URI symptoms but he never started that because he reports he is allergic.  He did see Dr. Lajoyce Corners on Tuesday and had x-rays done of his hip and knee and was given a steroid injection in his knee but his back was not evaluated.  He is not dragging his leg but reports with walking with a cane it is difficult to lift his leg and he is having a lot more trouble getting around.  He is also concerned about taking a shower because he is afraid he may fall.  He has been compliant with his dialysis and his last full treatment was yesterday.  Also this past week he had his tunneling catheter removed and has been doing well after that.  The history is provided by the patient, the spouse and medical records.   Back Pain Shortness of Breath      Home Medications Prior to Admission medications   Medication Sig Start Date End Date Taking? Authorizing Provider  lidocaine (LIDODERM) 5 % Place 1 patch onto the skin daily. Remove & Discard patch within 12 hours or as directed by MD 07/22/22  Yes Gwyneth Sprout, MD  oxyCODONE-acetaminophen (PERCOCET/ROXICET) 5-325 MG tablet Take 1 tablet by mouth every 6 (six) hours as needed for severe pain. 07/22/22  Yes Gwyneth Sprout, MD  acetaminophen (TYLENOL) 500 MG tablet Take 1,000 mg by mouth every 6 (six) hours as needed for moderate pain.    [provider]  aspirin EC 81 MG tablet Take 1 tablet (81 mg total) by mouth daily. Patient taking differently: Take 81 mg by mouth at bedtime. 05/28/12   Tereso Newcomer T, PA-C  atorvastatin (LIPITOR) 40 MG tablet Take 1 tablet (40 mg total) by mouth daily at 6 PM. Patient taking differently: Take 40 mg by mouth at bedtime. 12/01/19   Angiulli, Mcarthur Rossetti, PA-C  CALCITRIOL PO Take 1 tablet by mouth every Monday, Wednesday, and Friday.    [provider]  calcium acetate (PHOSLO) 667 MG capsule Take 2 capsules (1,334 mg total) by mouth 2 (two) times daily with a meal. 12/01/19   Angiulli, Mcarthur Rossetti, PA-C  clopidogrel (PLAVIX) 75 MG tablet Take 75 mg by mouth in  the morning. 12/01/19   [provider]  Etelcalcetide HCl (PARSABIV IV) Etelcalcetide Brigitte Pulse) 08/03/21 08/02/22  [provider]  gabapentin (NEURONTIN) 100 MG capsule Take 100-200 mg by mouth See admin instructions. 100 mg in the morning, 200 mg in the evening 11/28/19   [provider]  HYDROcodone bit-homatropine (HYCODAN) 5-1.5 MG/5ML syrup Take 5 mLs by mouth every 6 (six) hours as needed for cough. 07/03/22   Mardella Layman, MD  loperamide (IMODIUM) 2 MG capsule Take 2 mg by mouth as needed for diarrhea or loose stools.    [provider]  midodrine (PROAMATINE) 10 MG tablet Take 10 mg by mouth in the morning  and at bedtime. 12/01/19   [provider]  multivitamin (RENA-VIT) TABS tablet Take 1 tablet by mouth at bedtime. 12/01/19   Angiulli, Mcarthur Rossetti, PA-C  ONETOUCH ULTRA test strip See admin instructions. 01/14/21   [provider]  pantoprazole (PROTONIX) 40 MG tablet Take 40 mg by mouth daily before breakfast.    [provider]      Allergies    Contrast media [iodinated contrast media], Kiwi extract, Flexeril [cyclobenzaprine], and Tape    Review of Systems   Review of Systems  Respiratory:  Positive for shortness of breath.   Musculoskeletal:  Positive for back pain.    Physical Exam Updated Vital Signs BP (!) 189/104   Pulse 94   Temp 99 F (37.2 C)   Resp 18   SpO2 94%  Physical Exam Vitals and nursing note reviewed.  Constitutional:      General: He is not in acute distress.    Appearance: He is well-developed.  HENT:     Head: Normocephalic and atraumatic.  Eyes:     Conjunctiva/sclera: Conjunctivae normal.     Pupils: Pupils are equal, round, and reactive to light.  Cardiovascular:     Rate and Rhythm: Regular rhythm. Tachycardia present.     Heart sounds: No murmur heard.    Comments: Tunneling cath site healing  Pulmonary:     Effort: Pulmonary effort is normal. No respiratory distress.     Breath sounds: Normal breath sounds. No wheezing or rales.  Abdominal:     General: There is no distension.     Palpations: Abdomen is soft.     Tenderness: There is no abdominal tenderness. There is no right CVA tenderness, left CVA tenderness, guarding or rebound.  Musculoskeletal:        General: Normal range of motion.     Cervical back: Normal range of motion and neck supple.     Lumbar back: Tenderness and bony tenderness present. Normal range of motion.       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  Scar is well-healed and no wounds are present.  Faint palpable DP pulse present in the right foot with normal  skin color.  Foot is warm.  No swelling noted to the right lower extremity.  Pain with ranging of the leg at the hip.  Skin:    General: Skin is warm and dry.     Findings: No erythema or rash.     Comments: No rashes noted on the back or legs  Neurological:     Mental Status: He is alert and oriented to person, place, and time.     Comments: 4 out of 5 strength in the right lower extremity compared to the left  Psychiatric:  Behavior: Behavior normal.     ED Results / Procedures / Treatments   Labs (all labs ordered are listed, but only abnormal results are displayed) Labs Reviewed  BASIC METABOLIC PANEL - Abnormal; Notable for the following components:      Result Value   Chloride 90 (*)    Glucose, Bld 132 (*)    BUN 26 (*)    Creatinine, Ser 8.52 (*)    Calcium 8.2 (*)    GFR, Estimated 6 (*)    Anion gap 16 (*)    All other components within normal limits  CBC - Abnormal; Notable for the following components:   RBC 3.86 (*)    Hemoglobin 10.9 (*)    HCT 34.9 (*)    All other components within normal limits  SEDIMENTATION RATE - Abnormal; Notable for the following components:   Sed Rate 25 (*)    All other components within normal limits  TROPONIN I (HIGH SENSITIVITY) - Abnormal; Notable for the following components:   Troponin I (High Sensitivity) 48 (*)    All other components within normal limits  TROPONIN I (HIGH SENSITIVITY) - Abnormal; Notable for the following components:   Troponin I (High Sensitivity) 42 (*)    All other components within normal limits  C-REACTIVE PROTEIN    EKG EKG Interpretation  Date/Time:  Saturday July 22 2022 09:11:45 EDT Ventricular Rate:  109 PR Interval:  136 QRS Duration: 90 QT Interval:  346 QTC Calculation: 465 R Axis:   96 Text Interpretation: Sinus tachycardia with occasional Premature ventricular complexes Rightward axis Anterior infarct , age undetermined T wave abnormality, consider inferior ischemia No  significant change since last tracing When compared with ECG of 15-Nov-2021 13:24, PREVIOUS ECG IS PRESENT Confirmed by Gwyneth Sprout ([id number]) on 07/22/2022 9:59:53 AM  Radiology MR LUMBAR SPINE WO CONTRAST  Result Date: 07/22/2022 CLINICAL DATA:  Low back pain with symptoms persisting over 6 weeks of treatment EXAM: MRI LUMBAR SPINE WITHOUT CONTRAST TECHNIQUE: Multiplanar, multisequence MR imaging of the lumbar spine was performed. No intravenous contrast was administered. COMPARISON:  01/13/2018 FINDINGS: Segmentation: Standard. Mild dextroscoliosis. Mild fused anterolisthesis at L4-5 and L5-S1 Alignment:  Mild scoliosis Vertebrae: No fracture, evidence of discitis, or bone lesion. L4-5 and L5-S1 PLIF. Remote L3 inferior endplate fracture. Conus medullaris and cauda equina: Conus extends to the L1-2 level. Conus and cauda equina appear normal. Paraspinal and other soft tissues: Symmetric renal atrophy and dialysis related cystic disease. Disc levels: T12- L1: Unremarkable. L1-L2: Unremarkable. L2-L3: Mild disc height loss and bulging. L3-L4: Disc narrowing and bulging with endplate degeneration eccentric to the left. Degenerative facet spurring asymmetric to the left where there is mild foraminal stenosis L4-L5: PLIF. Disc narrowing, facet spurring, and residual disc material causes left foraminal impingement. L5-S1:PLIF. Cage subsidence and residual extruded disc causes biforaminal impingement when combined with facet spurs. Patent spinal canal. IMPRESSION: 1. No acute finding. 2. L4-5 left and L5-S1 bilateral foraminal impingement despite PLIF. Electronically Signed   By: Tiburcio Pea M.D.   On: 07/22/2022 11:50   DG Chest 2 View  Result Date: 07/22/2022 CLINICAL DATA:  cp EXAM: CHEST - 2 VIEW COMPARISON:  07/03/2022. FINDINGS: The heart size and mediastinal contours are within normal limits. Both lungs are clear. No pneumothorax or pleural effusion. Aorta is calcified. There are thoracic  degenerative changes. IMPRESSION: No active cardiopulmonary disease. Electronically Signed   By: Layla Maw M.D.   On: 07/22/2022 09:36    Procedures Procedures  Medications Ordered in ED Medications  lidocaine (LIDODERM) 5 % 1 patch (1 patch Transdermal Patch Applied 07/22/22 1221)  oxyCODONE-acetaminophen (PERCOCET/ROXICET) 5-325 MG per tablet 1 tablet (1 tablet Oral Given 07/22/22 1221)  methocarbamol (ROBAXIN) tablet 500 mg (500 mg Oral Given 07/22/22 1221)    ED Course/ Medical Decision Making/ A&P                             Medical Decision Making Amount and/or Complexity of Data Reviewed Labs: ordered. Radiology: ordered.  Risk Prescription drug management.   Pt with multiple medical problems and comorbidities and presenting today with a complaint that caries a high risk for morbidity and mortality.  Patient here today complaining of back and leg pain.  In addition patient is complicated due to multiple medical problems including prior history of peripheral vascular disease with a stent in his right leg, multiple back surgeries with hardware in his back as well as history of end-stage renal disease on dialysis.  Patient has also had URI symptoms for the last week and a low-grade fever of 99.  Patient was given antibiotics by his PCP but he never took them.  Patient does have a pulse today in his leg with normal-appearing skin and temperature in the leg with low suspicion that this is a vascular occlusion.  Suspect most likely lumbar radiculopathy however cannot rule out osteomyelitis/discitis.  Lower suspicion for epidural abscess.  I have independently visualized and interpreted pt's images today. Chest x-ray today does not show any evidence of pneumonia.  I independently interpreted patient's labs and EKG.  CBC without leukocytosis today, stable hemoglobin of 10, BMP with findings typical for end-stage renal disease but no electrolyte abnormalities.  ESR and CRP are pending.   MRI of the spine ordered for further evaluation.  Patient has had significant reaction to contrast dye resulting in PEA arrest and he reports it was to IV contrast with CT scan but he is nervous about getting gadolinium.  Will do the MRI without for this reason.  Patient also given pain control.  No findings or history consistent with stroke.  1:19 PM MRI today without evidence of infectious etiology.  He does have L4-5 left and L5-S1 bilateral foraminal impingement despite PLIF but no acute findings.  Discussed this with the patient and his wife.  He is improved after oral pain medication.  Repeat temperature was 98.5.  At this time low suspicion for infectious etiology in the back.  CRP was normal at 0.7, patient's initial troponin was 48 and repeat was 42 with his prior troponins being in the 100s.  Low suspicion for an acute cardiac cause.  At this time feel that patient can go home with a short course of pain control and follow-up with his physicians.  They were given return precautions.  He does have a walker he can use at home for better stability in the meantime.         Final Clinical Impression(s) / ED Diagnoses Final diagnoses:  Lumbar radiculopathy, acute    Rx / DC Orders ED Discharge Orders          Ordered    oxyCODONE-acetaminophen (PERCOCET/ROXICET) 5-325 MG tablet  Every 6 hours PRN        07/22/22 1317    lidocaine (LIDODERM) 5 %  Every 24 hours        07/22/22 1317  Gwyneth Sprout, MD 07/22/22 1319

## 2022-07-22 NOTE — ED Triage Notes (Signed)
Pt reports intermittent CP, SOB, and lower back pain without injury x1 week. States the back pain is making him SOB. Taking Tylenol without relief.

## 2022-07-30 ENCOUNTER — Encounter (HOSPITAL_COMMUNITY): Payer: Self-pay

## 2022-07-30 ENCOUNTER — Encounter: Payer: Self-pay | Admitting: Orthopedic Surgery

## 2022-07-30 ENCOUNTER — Ambulatory Visit (HOSPITAL_COMMUNITY)
Admission: EM | Admit: 2022-07-30 | Discharge: 2022-07-30 | Disposition: A | Discharge status: Home / Self Care | Payer: Medicare Other | Attending: Physician Assistant | Admitting: Physician Assistant

## 2022-07-30 DIAGNOSIS — M62830 Muscle spasm of back: Secondary | ICD-10-CM | POA: Diagnosis not present

## 2022-07-30 DIAGNOSIS — M25561 Pain in right knee: Secondary | ICD-10-CM | POA: Diagnosis not present

## 2022-07-30 DIAGNOSIS — M5442 Lumbago with sciatica, left side: Secondary | ICD-10-CM | POA: Diagnosis not present

## 2022-07-30 DIAGNOSIS — G8929 Other chronic pain: Secondary | ICD-10-CM

## 2022-07-30 MED ORDER — METHYLPREDNISOLONE ACETATE 40 MG/ML IJ SUSP
40.0000 mg | INTRAMUSCULAR | Status: AC | PRN
Start: 2022-07-30 — End: 2022-07-30
  Administered 2022-07-30: 40 mg via INTRA_ARTICULAR

## 2022-07-30 MED ORDER — PREDNISONE 10 MG PO TABS: 10.0000 mg | ORAL_TABLET | Freq: Three times a day (TID) | ORAL | 0 refills | Status: DC

## 2022-07-30 MED ORDER — TIZANIDINE HCL 4 MG PO TABS: 4.0000 mg | ORAL_TABLET | Freq: Three times a day (TID) | ORAL | 0 refills | Status: DC | PRN

## 2022-07-30 MED ORDER — LIDOCAINE HCL (PF) 1 % IJ SOLN
5.0000 mL | INTRAMUSCULAR | Status: AC | PRN
Start: 2022-07-30 — End: 2022-07-30
  Administered 2022-07-30: 5 mL

## 2022-07-30 MED ORDER — HYDROCODONE-ACETAMINOPHEN 5-325 MG PO TABS: 1.0000 | ORAL_TABLET | Freq: Four times a day (QID) | ORAL | 0 refills | Status: DC | PRN

## 2022-07-30 NOTE — Progress Notes (Signed)
Office Visit Note   Patient: Jon Anderson           Date of Birth: 04-03-1951           MRN: [id number] Visit Date: 07/18/2022              Requested by: Ralene Ok, MD [id number] [place],  [place] [address] PCP: Ralene Ok, MD  Chief Complaint  Patient presents with   Right Hip - Pain      HPI: Patient is a 72 year old gentleman who is seen in referral from [facility].  Patient is status post a right transmetatarsal amputation in 2021.  Patient states he has right hip pain and right knee pain.  Patient states he has constant pain with weightbearing in the knee.  Patient states he has sharp shooting pain in the foot that started Friday.  Patient denies any back pain.  Patient is status post lumbar spine fusion recent MRI scan shows no acute findings with L4-5 and L5-S1 bilateral foraminal impingement.  Assessment & Plan: Visit Diagnoses:  1. Pain in right hip   2. Acute pain of right knee   3. PVD (peripheral vascular disease)     Plan: Will follow-up in 4 weeks.  Recommended patient follow-up with Dr. Gery Pray he will need new ankle-brachial indices.  Will review patient's results from the knee injection.  Patient may require total hip arthroplasty on the right.  Follow-Up Instructions: No follow-ups on file.   Ortho Exam  Patient is alert, oriented, no adenopathy, well-dressed, normal affect, normal respiratory effort. Examination patient Has a negative straight leg raise bilaterally with no focal motor weakness.  He has a 20 degree equinus contracture on the right.  The right transmetatarsal amputation has no ulcers.  The right knee has pain with range of motion with range of motion 20 to 90 degrees.  The right hip has internal rotation 0 degrees external rotation of 30 degrees with pain with range of motion.  Imaging: No results found. No images are attached to the encounter.  Labs: Lab Results  Component Value Date   HGBA1C 7.6 (H) 11/15/2021   HGBA1C  6.4 (H) 07/24/2019   HGBA1C 6.5 (H) 07/18/2018   ESRSEDRATE 25 (H) 07/22/2022   ESRSEDRATE 22 (H) 07/24/2019   CRP 0.7 07/22/2022   CRP <0.5 07/24/2019   REPTSTATUS 11/18/2021 FINAL 11/16/2021   GRAMSTAIN  11/16/2021    ABUNDANT WBC PRESENT, PREDOMINANTLY PMN ABUNDANT GRAM NEGATIVE RODS ABUNDANT GRAM POSITIVE COCCI IN PAIRS    CULT  11/16/2021    MODERATE Normal respiratory flora-no Staph aureus or Pseudomonas seen Performed at [facility], [address] [place], [place] [address]      Lab Results  Component Value Date   ALBUMIN 3.7 11/26/2021   ALBUMIN 2.8 (L) 11/18/2021   ALBUMIN 2.3 (L) 11/15/2021   PREALBUMIN 28.0 07/24/2019    Lab Results  Component Value Date   MG 1.9 11/17/2021   MG 2.3 11/16/2021   MG 1.3 (L) 11/15/2021   No results found for: "VD25OH"  Lab Results  Component Value Date   PREALBUMIN 28.0 07/24/2019      Latest Ref Rng & Units 07/22/2022    9:15 AM 04/18/2022   10:41 AM 02/16/2022    7:22 AM  CBC EXTENDED  WBC 4.0 - 10.5 K/uL 7.1     RBC 4.22 - 5.81 MIL/uL 3.86     Hemoglobin 13.0 - 17.0 g/dL 62.1  30.8  11.9  HCT 39.0 - 52.0 % 34.9  37.0  35.0   Platelets 150 - 400 K/uL 272        There is no height or weight on file to calculate BMI.  Orders:  Orders Placed This Encounter  Procedures   XR Knee 1-2 Views Right   XR HIP UNILAT W OR W/O PELVIS 2-3 VIEWS RIGHT   No orders of the defined types were placed in this encounter.    Procedures: Large Joint Inj: R knee on 07/30/2022 9:18 AM Indications: pain and diagnostic evaluation Details: 22 G 1.5 in needle, anteromedial approach  Arthrogram: No  Medications: 5 mL lidocaine (PF) 1 %; 40 mg methylPREDNISolone acetate 40 MG/ML Outcome: tolerated well, no immediate complications Procedure, treatment alternatives, risks and benefits explained, specific risks discussed. Consent was given by the patient. Immediately prior to procedure a time out was called to verify the correct  patient, procedure, equipment, support staff and site/side marked as required. Patient was prepped and draped in the usual sterile fashion.      Clinical Data: No additional findings.  ROS:  All other systems negative, except as noted in the HPI. Review of Systems  Objective: Vital Signs: There were no vitals taken for this visit.  Specialty Comments:  No specialty comments available.  PMFS History: Patient Active Problem List   Diagnosis Date Noted   Cardiac arrest 11/15/2021   Encounter for change or removal of surgical wound dressing 12/10/2019   Acquired absence of other left toe(s) 12/10/2019   Type 2 diabetes mellitus with diabetic chronic kidney disease 12/03/2019   Right below-knee amputee 11/25/2019   S/P transmetatarsal amputation of foot, right 11/25/2019   Gangrene of right foot 11/19/2019   History of partial ray amputation of fifth toe of right foot 09/09/2019   Ischemic ulcer diabetic foot 09/09/2019   Subacute osteomyelitis, right ankle and foot    Diabetic foot infection 07/24/2019   Chronic diastolic CHF (congestive heart failure) 07/24/2019   Diabetic ulcer of right midfoot associated with diabetes mellitus due to underlying condition, with fat layer exposed    Gastroenteritis 04/24/2019   Presence of aortocoronary bypass graft 04/11/2019   Long term (current) use of aspirin 04/11/2019   Dependence on renal dialysis 04/11/2019   Other constipation 04/11/2019   Allergy, unspecified, initial encounter 01/27/2019   DM neuropathy with neurologic complication 02/28/2018   GERD (gastroesophageal reflux disease) 02/28/2018   Nephrolithiasis 02/28/2018   Sciatic leg pain 02/28/2018   Snores 02/28/2018   Orthopnea 02/23/2018   Elevated troponin 02/23/2018   HNP (herniated nucleus pulposus), lumbar 07/03/2017   Encounter for removal of sutures 06/01/2017   Chest pain in adult 04/27/2017   Restless leg syndrome, uncontrolled 04/27/2017   Hx of CABG Oct  2018/[facility] 03/17/2017   Anemia of chronic disease 03/17/2017   ESRD (end stage renal disease) 03/17/2017   Chronic chest pain 03/17/2017   Type II diabetes mellitus 03/17/2017   Acute on chronic diastolic heart failure 03/17/2017   Acute heart failure 03/17/2017   Lumbar radiculopathy 01/16/2017   Pre-transplant evaluation for kidney transplant 06/15/2016   Pain, unspecified 04/17/2016   Nocturia 12/17/2015   Chest pain, non-cardiac 10/09/2015   Hypercalcemia 05/10/2015   Other fluid overload 02/24/2015   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 10/03/2014   Fatty (change of) liver, not elsewhere classified 07/02/2014   Aftercare including intermittent dialysis 06/24/2014   Diarrhea, unspecified 06/24/2014   Fever, unspecified 06/24/2014   Iron deficiency  anemia, unspecified 06/24/2014   Other specified coagulation defects 06/24/2014   Pruritus, unspecified 06/24/2014   Secondary hyperparathyroidism of renal origin 06/24/2014   Type 2 diabetes mellitus with diabetic peripheral angiopathy without gangrene 06/24/2014   Acute on chronic renal failure 06/16/2014   Shoulder pain, left 12/15/2013   Chest pain 12/15/2013   Claudication (HCC) 12/11/2013   PVD (peripheral vascular disease) 12/11/2013   Carotid artery disease (HCC) 09/30/2013   Acute chest pain 11/15/2012   Bruit 09/15/2010   CAD (coronary artery disease) nonobstructive per cath 2012    Hyperlipidemia LDL goal <70 07/22/2009   Essential hypertension 07/22/2009   Past Medical History:  Diagnosis Date   Anemia of chronic disease    Arthritis    CAD (coronary artery disease)    Carotid stenosis    a. <50% RICA, >70% LICA by duplex 07/2015   Chronic chest pain    occ   Constipation    chronic   Dyspnea    occasional with extertion   ESRD (end stage renal disease) on dialysis    M-W-F- [name]   GERD (gastroesophageal reflux disease)    History of hiatal hernia    History of kidney  stones    Passed   HNP (herniated nucleus pulposus), lumbar    HTN (hypertension)    echo 3/10: EF 60%, LAE   Hyperlipidemia    Mitral stenosis    mild MS 11/15/21   Nephrolithiasis    "passed them all"   PEA (Pulseless electrical activity) 11/15/2021   PEA arrest felt likely related to contrast anaphylaxis despite premedication   Peripheral arterial disease    a. s/p PTCA  right    Pneumonia yrs ago   Restless legs    Sciatic leg pain    Sleep apnea    does not use cpap   Snores    a. presumed OSA, pt has refused sleep eval in past.   Type II diabetes mellitus    no longer on medications, checks blood glucose at home   Urinary frequency    Uses wheelchair    Walker as ambulation aid     Family History  Problem Relation Age of Onset   Heart attack Sister        died @ 37   Cancer Mother        died @ 53; unknown type   Diabetes Brother        deceased   Cirrhosis Father        alcohol related   Diabetes Father    Esophageal cancer Neg Hx    Colon cancer Neg Hx    Pancreatic cancer Neg Hx    Stomach cancer Neg Hx     Past Surgical History:  Procedure Laterality Date   A/V FISTULAGRAM Left 01/10/2021   Procedure: A/V FISTULAGRAM;  Surgeon: Maeola Harman, MD;  Location: [facility] INVASIVE CV LAB;  Service: Cardiovascular;  Laterality: Left;   A/V FISTULAGRAM Left 05/19/2021   Procedure: A/V Fistulagram;  Surgeon: Cephus Shelling, MD;  Location: [facility] INVASIVE CV LAB;  Service: Cardiovascular;  Laterality: Left;   ABDOMINAL AORTOGRAM W/LOWER EXTREMITY Bilateral 08/21/2019   Procedure: ABDOMINAL AORTOGRAM W/LOWER EXTREMITY;  Surgeon: Maeola Harman, MD;  Location: [facility] INVASIVE CV LAB;  Service: Cardiovascular;  Laterality: Bilateral;   AMPUTATION Right 07/25/2019   Procedure: RIGHT 5th RAY AMPUTATION;  Surgeon: Nadara Mustard, MD;  Location: [facility] OR;  Service: Orthopedics;  Laterality: Right;   AMPUTATION Right  11/19/2019   Procedure: RIGHT TRANSMETATARSAL  AMPUTATION;  Surgeon: Nadara Mustard, MD;  Location: [facility] OR;  Service: Orthopedics;  Laterality: Right;   ANGIOPLASTY / STENTING FEMORAL Left 12/11/2013   dr berry   AV FISTULA PLACEMENT Left 03/19/2014   Procedure: CREATION OF ARTERIOVENOUS (AV) FISTULA  LEFT UPPER ARM;  Surgeon: Pryor Ochoa, MD;  Location: [facility] OR;  Service: Vascular;  Laterality: Left;   AV FISTULA PLACEMENT Left 11/09/2020   Procedure: LEFT ARM ARTERIOVENOUS (AV) FISTULA CREATION;  Surgeon: Maeola Harman, MD;  Location: [facility] OR;  Service: Vascular;  Laterality: Left;   AV FISTULA PLACEMENT Left 02/15/2021   Procedure: INSERTION OF LEFT ARM ARTERIOVENOUS GORE-TEX GRAFT;  Surgeon: Maeola Harman, MD;  Location: [facility] OR;  Service: Vascular;  Laterality: Left;   AV FISTULA PLACEMENT Right 02/16/2022   Procedure: RIGHT ARM BRACHIOCEPAHLIC ARTERIOVENOUS (AV) FISTULA CREATION;  Surgeon: Maeola Harman, MD;  Location: [facility] OR;  Service: Vascular;  Laterality: Right;  Regional and MAC   BACK SURGERY  01/2018   screws placed    BASCILIC VEIN TRANSPOSITION Left 12/14/2020   Procedure: REVISION OF LEFT ARM SECOND STAGE BASILIC VEIN TRANSPOSITION;  Surgeon: Maeola Harman, MD;  Location: [facility] OR;  Service: Vascular;  Laterality: Left;   BASCILIC VEIN TRANSPOSITION Right 04/18/2022   Procedure: RIGHT BRACHIOBASILIC FISTULA SECOND STAGE TRANSPOSITION;  Surgeon: Maeola Harman, MD;  Location: [facility] OR;  Service: Vascular;  Laterality: Right;   CARDIAC CATHETERIZATION  2001 and 2010    COLONOSCOPY W/ BIOPSIES AND POLYPECTOMY     COLONOSCOPY WITH PROPOFOL N/A 08/01/2016   Procedure: COLONOSCOPY WITH PROPOFOL;  Surgeon: Jeani Hawking, MD;  Location: [facility]COPY;  Service: Endoscopy;  Laterality: N/A;   COLONOSCOPY WITH PROPOFOL N/A 01/17/2022   Procedure: COLONOSCOPY WITH PROPOFOL;  Surgeon: Jeani Hawking, MD;  Location: [facility];  Service: Gastroenterology;  Laterality: N/A;   CORONARY ARTERY BYPASS  GRAFT  2019   [facility] x [facility] bypass   ESOPHAGOGASTRODUODENOSCOPY (EGD) WITH PROPOFOL N/A 08/01/2016   Procedure: ESOPHAGOGASTRODUODENOSCOPY (EGD) WITH PROPOFOL;  Surgeon: Jeani Hawking, MD;  Location: [facility];  Service: Endoscopy;  Laterality: N/A;   FISTULA SUPERFICIALIZATION Left 02/10/2020   Procedure: 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;  Surgeon: Larina Earthly, MD;  Location: [facility] OR;  Service: Vascular;  Laterality: Left;   FISTULA SUPERFICIALIZATION Left 03/23/2020   Procedure: LEFT UPPER EXTREMITY ARTERIOVENOUS FISTULA PLICATION;  Surgeon: Larina Earthly, MD;  Location: [facility] OR;  Service: Vascular;  Laterality: Left;   FOOT FRACTURE SURGERY Right    ligament repair   FRACTURE SURGERY     left forearm   GRAFT APPLICATION Right 05/13/2019   Procedure: FAT GRAFT APPLICATION;  Surgeon: Park Liter, DPM;  Location: [facility];  Service: Podiatry;  Laterality: Right;   HEMATOMA EVACUATION Left 11/09/2020   Procedure: EVACUATION HEMATOMA LEFT ARM;  Surgeon: Chuck Hint, MD;  Location: [facility] OR;  Service: Vascular;  Laterality: Left;   INGUINAL HERNIA REPAIR Left    IR AV DIALY SHUNT INTRO NEEDLE/INTRACATH INITIAL W/PTA/IMG LEFT  11/15/2021   IR FLUORO GUIDE CV LINE RIGHT  01/20/2022   IR FLUORO GUIDE CV LINE RIGHT  05/03/2022   IR REMOVAL TUN CV CATH W/O FL  06/16/2021   IR REMOVAL TUN CV CATH W/O FL  07/06/2022   IR US GUIDE VASC ACCESS LEFT  11/15/2021   IR US GUIDE VASC ACCESS LEFT  11/15/2021   IR US GUIDE  VASC ACCESS RIGHT  11/15/2021   IR US GUIDE VASC ACCESS RIGHT  01/20/2022   LEFT HEART CATH AND CORS/GRAFTS ANGIOGRAPHY N/A 04/27/2017   Procedure: LEFT HEART CATH AND CORS/GRAFTS ANGIOGRAPHY;  Surgeon: Marykay Lex, MD;  Location: [facility] INVASIVE CV LAB;  Service: Cardiovascular;  Laterality: N/A;   LEFT HEART CATHETERIZATION WITH CORONARY ANGIOGRAM N/A 06/22/2014   Procedure: LEFT HEART CATHETERIZATION WITH CORONARY ANGIOGRAM;  Surgeon: Lennette Bihari,  MD;  Location: [facility] CATH LAB;  Service: Cardiovascular;  Laterality: N/A;   LOWER EXTREMITY ANGIOGRAM Left 12/11/2013   Procedure: LOWER EXTREMITY ANGIOGRAM;  Surgeon: Runell Gess, MD;  Location: [facility] CATH LAB;  Service: Cardiovascular;  Laterality: Left;   LUMBAR LAMINECTOMY/DECOMPRESSION MICRODISCECTOMY Right 07/03/2017   Procedure: MICRODISCECTOMY LUMBAR FIVE - SACRAL ONE RIGHT;  Surgeon: Lisbeth Renshaw, MD;  Location: [facility] OR;  Service: Neurosurgery;  Laterality: Right;   LUMBAR LAMINECTOMY/DECOMPRESSION MICRODISCECTOMY Right 10/19/2017   Procedure: MICRODISCECTOMY LUMBAR FIVE- SACRAL 1 ONE ;  Surgeon: Lisbeth Renshaw, MD;  Location: [facility] OR;  Service: Neurosurgery;  Laterality: Right;   PERIPHERAL VASCULAR BALLOON ANGIOPLASTY Left 05/19/2021   Procedure: PERIPHERAL VASCULAR BALLOON ANGIOPLASTY;  Surgeon: Cephus Shelling, MD;  Location: [facility] INVASIVE CV LAB;  Service: Cardiovascular;  Laterality: Left;   PERIPHERAL VASCULAR INTERVENTION Left 01/10/2021   Procedure: PERIPHERAL VASCULAR INTERVENTION;  Surgeon: Maeola Harman, MD;  Location: [facility] INVASIVE CV LAB;  Service: Cardiovascular;  Laterality: Left;   POLYPECTOMY  01/17/2022   Procedure: POLYPECTOMY;  Surgeon: Jeani Hawking, MD;  Location: [facility];  Service: Gastroenterology;;   THROMBECTOMY W/ EMBOLECTOMY Left 11/09/2020   Procedure: THROMBECTOMY OF LEFT ARTERIOVENOUS FISTULA;  Surgeon: Chuck Hint, MD;  Location: [facility] OR;  Service: Vascular;  Laterality: Left;   TONSILLECTOMY AND ADENOIDECTOMY     WISDOM TOOTH EXTRACTION     WOUND DEBRIDEMENT Right 05/13/2019   Procedure: DEBRIDEMENT WOUND;  Surgeon: Park Liter, DPM;  Location: [facility];  Service: Podiatry;  Laterality: Right;   Social History   Occupational History   Occupation: retired  Tobacco Use   Smoking status: Former    Packs/day: 1.00    Years: 2.00    Additional pack years: 0.00    Total pack years: 2.00    Types:  Cigarettes    Quit date: 1978    Years since quitting: 46.3    Passive exposure: Never   Smokeless tobacco: Never  Vaping Use   Vaping Use: Never used  Substance and Sexual Activity   Alcohol use: Not Currently    Alcohol/week: 0.0 standard drinks of alcohol    Comment: h/o social drinking   Drug use: Not Currently    Types: Marijuana    Comment: quit 1986   Sexual activity: Yes    Birth control/protection: None

## 2022-07-30 NOTE — ED Triage Notes (Signed)
Patient here today with c/o right side LB pain traveling down right leg X 2 weeks. He does have some numbness and tingling in the upper part of his right leg. He went to the ED last Saturday and had xrays done. He was giving Oxycodone with no relief. He has also tried Tylenol with no relief. He has not slept well in the last 2 weeks due to the pain. The pain makes him SOB.

## 2022-07-30 NOTE — Discharge Instructions (Signed)
Advised take Zanaflex 4 mg every 8-12 hours to help relieve muscle spasm and irritability. Advised take the hydrocodone 5/325 1 tablet every 6-8 hours as needed for pain relief. Advised take prednisone 10 mg 3 times a day 3 days only to help relieve acute inflammatory response.  Internal referral has been made to [facility] orthopedics, their office should be calling you within the next 48 to 72 hours to arrange appointment for evaluation.  Advised follow-up PCP return to urgent care as needed.

## 2022-07-30 NOTE — ED Provider Notes (Signed)
[facility]    CSN: [id number] Arrival date & time: 07/30/22  1305      History   Chief Complaint Chief Complaint  Patient presents with   Back Pain    HPI Jon Anderson is a 72 y.o. male.   72 year old male presents with lower back pain.  Patient indicates for the past 2 weeks that he has been having lower back pain which is mainly located on the right side.  He indicates that the pain is persistent, steady, severe, rating the pain as a 10 on a scale from 1-10.  He indicates the pain is worse when he tends to get up and walk.  He indicates he is having to use a cane and does have to limp due to the pain.  He indicates the back pain radiates from the lower right side of the back down the the right leg all the way to the foot.  He indicates he is having associated intermittent numbness and tingling of the right leg.  Patient did have an MRI scan performed 1 week ago and it does reveal that he has got some impingement of the L4/L5 and L5/S1 bilaterally.  Patient does have a history of having chronic lower back pain.  Patient also has on multiple medications, and is in ESRD and has dialysis 3 days a week.  Patient is currently taking MiraLAX for constipation which she has had for the past week.  Patient is a diabetic and relates that his glucose has been running 130 over the past week.  He is without fever or chills.  Patient indicates that he has not seen a neurosurgeon in several years.  Patient was seen in the emergency room on 07/22/2022 for lower back pain-refer to prior note.   Back Pain   Past Medical History:  Diagnosis Date   Anemia of chronic disease    Arthritis    CAD (coronary artery disease)    Carotid stenosis    a. <50% RICA, >70% LICA by duplex 07/2015   Chronic chest pain    occ   Constipation    chronic   Dyspnea    occasional with extertion   ESRD (end stage renal disease) on dialysis    M-W-F- [name]   GERD (gastroesophageal reflux disease)     History of hiatal hernia    History of kidney stones    Passed   HNP (herniated nucleus pulposus), lumbar    HTN (hypertension)    echo 3/10: EF 60%, LAE   Hyperlipidemia    Mitral stenosis    mild MS 11/15/21   Nephrolithiasis    "passed them all"   PEA (Pulseless electrical activity) 11/15/2021   PEA arrest felt likely related to contrast anaphylaxis despite premedication   Peripheral arterial disease    a. s/p PTCA  right    Pneumonia yrs ago   Restless legs    Sciatic leg pain    Sleep apnea    does not use cpap   Snores    a. presumed OSA, pt has refused sleep eval in past.   Type II diabetes mellitus    no longer on medications, checks blood glucose at home   Urinary frequency    Uses wheelchair    Walker as ambulation aid     Patient Active Problem List   Diagnosis Date Noted   Cardiac arrest 11/15/2021   Encounter for change or removal of surgical wound dressing 12/10/2019   Acquired  absence of other left toe(s) 12/10/2019   Type 2 diabetes mellitus with diabetic chronic kidney disease 12/03/2019   Right below-knee amputee 11/25/2019   S/P transmetatarsal amputation of foot, right 11/25/2019   Gangrene of right foot 11/19/2019   History of partial ray amputation of fifth toe of right foot 09/09/2019   Ischemic ulcer diabetic foot 09/09/2019   Subacute osteomyelitis, right ankle and foot    Diabetic foot infection 07/24/2019   Chronic diastolic CHF (congestive heart failure) 07/24/2019   Diabetic ulcer of right midfoot associated with diabetes mellitus due to underlying condition, with fat layer exposed    Gastroenteritis 04/24/2019   Presence of aortocoronary bypass graft 04/11/2019   Long term (current) use of aspirin 04/11/2019   Dependence on renal dialysis 04/11/2019   Other constipation 04/11/2019   Allergy, unspecified, initial encounter 01/27/2019   DM neuropathy with neurologic complication 02/28/2018   GERD (gastroesophageal reflux disease)  02/28/2018   Nephrolithiasis 02/28/2018   Sciatic leg pain 02/28/2018   Snores 02/28/2018   Orthopnea 02/23/2018   Elevated troponin 02/23/2018   HNP (herniated nucleus pulposus), lumbar 07/03/2017   Encounter for removal of sutures 06/01/2017   Chest pain in adult 04/27/2017   Restless leg syndrome, uncontrolled 04/27/2017   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/[facility] 03/17/2017   Anemia of chronic disease 03/17/2017   ESRD (end stage renal disease) 03/17/2017   Chronic chest pain 03/17/2017   Type II diabetes mellitus 03/17/2017   Acute on chronic diastolic heart failure 03/17/2017   Acute heart failure 03/17/2017   Lumbar radiculopathy 01/16/2017   Pre-transplant evaluation for kidney transplant 06/15/2016   Pain, unspecified 04/17/2016   Nocturia 12/17/2015   Chest pain, non-cardiac 10/09/2015   Hypercalcemia 05/10/2015   Other fluid overload 02/24/2015   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 10/03/2014   Fatty (change of) liver, not elsewhere classified 07/02/2014   Aftercare including intermittent dialysis 06/24/2014   Diarrhea, unspecified 06/24/2014   Fever, unspecified 06/24/2014   Iron deficiency anemia, unspecified 06/24/2014   Other specified coagulation defects 06/24/2014   Pruritus, unspecified 06/24/2014   Secondary hyperparathyroidism of renal origin 06/24/2014   Type 2 diabetes mellitus with diabetic peripheral angiopathy without gangrene 06/24/2014   Acute on chronic renal failure 06/16/2014   Shoulder pain, left 12/15/2013   Chest pain 12/15/2013   Claudication (HCC) 12/11/2013   PVD (peripheral vascular disease) 12/11/2013   Carotid artery disease (HCC) 09/30/2013   Acute chest pain 11/15/2012   Bruit 09/15/2010   CAD (coronary artery disease) nonobstructive per cath 2012    Hyperlipidemia LDL goal <70 07/22/2009   Essential hypertension 07/22/2009    Past Surgical History:  Procedure Laterality Date   A/V FISTULAGRAM Left 01/10/2021    Procedure: A/V FISTULAGRAM;  Surgeon: Maeola Harman, MD;  Location: [facility] INVASIVE CV LAB;  Service: Cardiovascular;  Laterality: Left;   A/V FISTULAGRAM Left 05/19/2021   Procedure: A/V Fistulagram;  Surgeon: Cephus Shelling, MD;  Location: [facility] INVASIVE CV LAB;  Service: Cardiovascular;  Laterality: Left;   ABDOMINAL AORTOGRAM W/LOWER EXTREMITY Bilateral 08/21/2019   Procedure: ABDOMINAL AORTOGRAM W/LOWER EXTREMITY;  Surgeon: Maeola Harman, MD;  Location: [facility] INVASIVE CV LAB;  Service: Cardiovascular;  Laterality: Bilateral;   AMPUTATION Right 07/25/2019   Procedure: RIGHT 5th RAY AMPUTATION;  Surgeon: Nadara Mustard, MD;  Location: [facility] OR;  Service: Orthopedics;  Laterality: Right;   AMPUTATION Right 11/19/2019   Procedure: RIGHT TRANSMETATARSAL AMPUTATION;  Surgeon: Nadara Mustard, MD;  Location: [facility] OR;  Service: Orthopedics;  Laterality: Right;   ANGIOPLASTY / STENTING FEMORAL Left 12/11/2013   dr berry   AV FISTULA PLACEMENT Left 03/19/2014   Procedure: CREATION OF ARTERIOVENOUS (AV) FISTULA  LEFT UPPER ARM;  Surgeon: Pryor Ochoa, MD;  Location: [facility] OR;  Service: Vascular;  Laterality: Left;   AV FISTULA PLACEMENT Left 11/09/2020   Procedure: LEFT ARM ARTERIOVENOUS (AV) FISTULA CREATION;  Surgeon: Maeola Harman, MD;  Location: [facility] OR;  Service: Vascular;  Laterality: Left;   AV FISTULA PLACEMENT Left 02/15/2021   Procedure: INSERTION OF LEFT ARM ARTERIOVENOUS GORE-TEX GRAFT;  Surgeon: Maeola Harman, MD;  Location: [facility] OR;  Service: Vascular;  Laterality: Left;   AV FISTULA PLACEMENT Right 02/16/2022   Procedure: RIGHT ARM BRACHIOCEPAHLIC ARTERIOVENOUS (AV) FISTULA CREATION;  Surgeon: Maeola Harman, MD;  Location: [facility] OR;  Service: Vascular;  Laterality: Right;  [facility]   BACK SURGERY  01/2018   screws placed    BASCILIC VEIN TRANSPOSITION Left 12/14/2020   Procedure: REVISION OF LEFT ARM SECOND STAGE BASILIC VEIN TRANSPOSITION;   Surgeon: Maeola Harman, MD;  Location: [facility] OR;  Service: Vascular;  Laterality: Left;   BASCILIC VEIN TRANSPOSITION Right 04/18/2022   Procedure: RIGHT BRACHIOBASILIC FISTULA SECOND STAGE TRANSPOSITION;  Surgeon: Maeola Harman, MD;  Location: [facility] OR;  Service: Vascular;  Laterality: Right;   CARDIAC CATHETERIZATION  2001 and 2010    COLONOSCOPY W/ BIOPSIES AND POLYPECTOMY     COLONOSCOPY WITH PROPOFOL N/A 08/01/2016   Procedure: COLONOSCOPY WITH PROPOFOL;  Surgeon: Jeani Hawking, MD;  Location: [facility] END[facility]COPY;  Service: Endoscopy;  Laterality: N/A;   COLONOSCOPY WITH PROPOFOL N/A 01/17/2022   Procedure: COLONOSCOPY WITH PROPOFOL;  Surgeon: Jeani Hawking, MD;  Location: [facility]COPY;  Service: Gastroenterology;  Laterality: N/A;   CORONARY ARTERY BYPASS GRAFT  2019   [facility] x [facility] bypass   ESOPHAGOGASTRODUODENOSCOPY (EGD) WITH PROPOFOL N/A 08/01/2016   Procedure: ESOPHAGOGASTRODUODENOSCOPY (EGD) WITH PROPOFOL;  Surgeon: Jeani Hawking, MD;  Location: [facility];  Service: Endoscopy;  Laterality: N/A;   FISTULA SUPERFICIALIZATION Left 02/10/2020   Procedure: 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;  Surgeon: Larina Earthly, MD;  Location[facility];  Service: Vascular;  Laterality: Left;   FISTULA SUPERFICIALIZATION Left 03/23/2020   Procedure: LEFT UPPER EXTREMITY ARTERIOVENOUS FISTULA PLICATION;  Surgeon: Larina Earthly, MD;  Location[facility];  Service: Vascular;  Laterality: Left;   FOOT FRACTURE SURGERY Right    ligament repair   FRACTURE SURGERY     left forearm   GRAFT APPLICATION Right 05/13/2019   Procedure: FAT GRAFT APPLICATION;  Surgeon: Park Liter, DPM;  Location: [facility];  Service: Podiatry;  Laterality: Right;   HEMATOMA EVACUATION Left 11/09/2020   Procedure: EVACUATION HEMATOMA LEFT ARM;  Surgeon: Chuck Hint, MD;  Location: [facility] OR;  Service: Vascular;  Laterality: Left;   INGUINAL HERNIA REPAIR Left    IR AV DIALY SHUNT  INTRO NEEDLE/INTRACATH INITIAL W/PTA/IMG LEFT  11/15/2021   IR FLUORO GUIDE CV LINE RIGHT  01/20/2022   IR FLUORO GUIDE CV LINE RIGHT  05/03/2022   IR REMOVAL TUN CV CATH W/O FL  06/16/2021   IR REMOVAL TUN CV CATH W/O FL  07/06/2022   IR US GUIDE VASC ACCESS LEFT  11/15/2021   IR US GUIDE VASC ACCESS LEFT  11/15/2021   IR US GUIDE VASC ACCESS RIGHT  11/15/2021   IR US GUIDE VASC ACCESS RIGHT  01/20/2022   LEFT HEART CATH AND CORS/GRAFTS ANGIOGRAPHY N/A 04/27/2017   Procedure: LEFT HEART CATH AND CORS/GRAFTS ANGIOGRAPHY;  Surgeon: Marykay Lex, MD;  Location: [facility] INVASIVE CV LAB;  Service: Cardiovascular;  Laterality: N/A;   LEFT HEART CATHETERIZATION WITH CORONARY ANGIOGRAM N/A 06/22/2014   Procedure: LEFT HEART CATHETERIZATION WITH CORONARY ANGIOGRAM;  Surgeon: Lennette Bihari, MD;  Location: [facility] CATH LAB;  Service: Cardiovascular;  Laterality: N/A;   LOWER EXTREMITY ANGIOGRAM Left 12/11/2013   Procedure: LOWER EXTREMITY ANGIOGRAM;  Surgeon: Runell Gess, MD;  Location: [facility] CATH LAB;  Service: Cardiovascular;  Laterality: Left;   LUMBAR LAMINECTOMY/DECOMPRESSION MICRODISCECTOMY Right 07/03/2017   Procedure: MICRODISCECTOMY LUMBAR FIVE - SACRAL ONE RIGHT;  Surgeon: Lisbeth Renshaw, MD;  Location[facility];  Service: Neurosurgery;  Laterality: Right;   LUMBAR LAMINECTOMY/DECOMPRESSION MICRODISCECTOMY Right 10/19/2017   Procedure: MICRODISCECTOMY LUMBAR FIVE- SACRAL 1 ONE ;  Surgeon: Lisbeth Renshaw, MD;  Location[facility];  Service: Neurosurgery;  Laterality: Right;   PERIPHERAL VASCULAR BALLOON ANGIOPLASTY Left 05/19/2021   Procedure: PERIPHERAL VASCULAR BALLOON ANGIOPLASTY;  Surgeon: Cephus Shelling, MD;  [facility] CV LAB;  Service: Cardiovascular;  Laterality: Left;   PERIPHERAL VASCULAR INTERVENTION Left 01/10/2021   Procedure: PERIPHERAL VASCULAR INTERVENTION;  Surgeon: Maeola Harman, MD;  Location: [facility] INVASIVE CV LAB;  Service: Cardiovascular;  Laterality: Left;    POLYPECTOMY  01/17/2022   Procedure: POLYPECTOMY;  Surgeon: Jeani Hawking, MD;  Location: [facility];  Service: Gastroenterology;;   THROMBECTOMY W/ EMBOLECTOMY Left 11/09/2020   Procedure: THROMBECTOMY OF LEFT ARTERIOVENOUS FISTULA;  Surgeon: Chuck Hint, MD;  Location: [facility] OR;  Service: Vascular;  Laterality: Left;   TONSILLECTOMY AND ADENOIDECTOMY     WISDOM TOOTH EXTRACTION     WOUND DEBRIDEMENT Right 05/13/2019   Procedure: DEBRIDEMENT WOUND;  Surgeon: Park Liter, DPM;  Location: [facility] ;  Service: Podiatry;  Laterality: Right;       Home Medications    Prior to Admission medications   Medication Sig Start Date End Date Taking? Authorizing Provider  acetaminophen (TYLENOL) 500 MG tablet Take 1,000 mg by mouth every 6 (six) hours as needed for moderate pain.   Yes [provider]  aspirin EC 81 MG tablet Take 1 tablet (81 mg total) by mouth daily. Patient taking differently: Take 81 mg by mouth at bedtime. 05/28/12  Yes Weaver, Scott T, PA-C  atorvastatin (LIPITOR) 40 MG tablet Take 1 tablet (40 mg total) by mouth daily at 6 PM. Patient taking differently: Take 40 mg by mouth at bedtime. 12/01/19  Yes AngiulliMcarthur Rossetti, PA-C  CALCITRIOL PO Take 1 tablet by mouth every Monday, Wednesday, and Friday.   Yes [provider]  calcium acetate (PHOSLO) 667 MG capsule Take 2 capsules (1,334 mg total) by mouth 2 (two) times daily with a meal. 12/01/19  Yes Angiulli, Mcarthur Rossetti, PA-C  clopidogrel (PLAVIX) 75 MG tablet Take 75 mg by mouth in the morning. 12/01/19  Yes [provider]  Etelcalcetide HCl (PARSABIV IV) Etelcalcetide Brigitte Pulse) 08/03/21 08/02/22 Yes [provider]  gabapentin (NEURONTIN) 100 MG capsule Take 100-200 mg by mouth See admin instructions. 100 mg in the morning, 200 mg in the evening 11/28/19  Yes [provider]  HYDROcodone-acetaminophen (NORCO/VICODIN) 5-325 MG tablet Take 1 tablet by mouth every 6  (six) hours as needed for moderate pain. 07/30/22  Yes Ellsworth Lennox, PA-C  midodrine (PROAMATINE) 10 MG tablet Take 10 mg by mouth in the morning and  at bedtime. 12/01/19  Yes [provider]  multivitamin (RENA-VIT) TABS tablet Take 1 tablet by mouth at bedtime. 12/01/19  Yes Angiulli, Mcarthur Rossetti, PA-C  [facility] ULTRA test strip See admin instructions. 01/14/21  Yes [provider]  oxyCODONE-acetaminophen (PERCOCET/ROXICET) 5-325 MG tablet Take 1 tablet by mouth every 6 (six) hours as needed for severe pain. 07/22/22  Yes Gwyneth Sprout, MD  pantoprazole (PROTONIX) 40 MG tablet Take 40 mg by mouth daily before breakfast.   Yes [provider]  predniSONE (DELTASONE) 10 MG tablet Take 1 tablet (10 mg total) by mouth in the morning, at noon, and at bedtime. 07/30/22  Yes Ellsworth Lennox, PA-C  tiZANidine (ZANAFLEX) 4 MG tablet Take 1 tablet (4 mg total) by mouth every 8 (eight) hours as needed for muscle spasms. 07/30/22  Yes Ellsworth Lennox, PA-C  lidocaine (LIDODERM) 5 % Place 1 patch onto the skin daily. Remove & Discard patch within 12 hours or as directed by MD 07/22/22   Gwyneth Sprout, MD  loperamide (IMODIUM) 2 MG capsule Take 2 mg by mouth as needed for diarrhea or loose stools.    [provider]    Family History Family History  Problem Relation Age of Onset   Heart attack Sister        died @ 108   Cancer Mother        died @ 34; unknown type   Diabetes Brother        deceased   Cirrhosis Father        alcohol related   Diabetes Father    Esophageal cancer Neg Hx    Colon cancer Neg Hx    Pancreatic cancer Neg Hx    Stomach cancer Neg Hx     Social History Social History   Tobacco Use   Smoking status: Former    Packs/day: 1.00    Years: 2.00    Additional pack years: 0.00    Total pack years: 2.00    Types: Cigarettes    Quit date: 1978    Years since quitting: 46.3    Passive exposure: Never   Smokeless tobacco: Never  Vaping Use    Vaping Use: Never used  Substance Use Topics   Alcohol use: Not Currently    Alcohol/week: 0.0 standard drinks of alcohol    Comment: h/o social drinking   Drug use: Not Currently    Types: Marijuana    Comment: quit 1986     Allergies   Contrast media [iodinated contrast media], Kiwi extract, Flexeril [cyclobenzaprine], and Tape   Review of Systems Review of Systems  Musculoskeletal:  Positive for back pain (lower back right side down right leg).     Physical Exam Triage Vital Signs ED Triage Vitals  Enc Vitals Group     BP 07/30/22 1353 (!) 175/87     Pulse Rate 07/30/22 1353 83     Resp 07/30/22 1353 16     Temp 07/30/22 1353 98.2 F (36.8 C)     Temp Source 07/30/22 1353 Oral     SpO2 07/30/22 1353 91 %     Weight 07/30/22 1353 172 lb (78 kg)     Height 07/30/22 1353 5\' 9"  (1.753 m)     Head Circumference --      Peak Flow --      Pain Score 07/30/22 1352 10     Pain Loc --      Pain Edu? --  Excl. in GC? --    No data found.  Updated Vital Signs BP (!) 175/87 (BP Location: Left Arm)   Pulse 83   Temp 98.2 F (36.8 C) (Oral)   Resp 16   Ht 5\' 9"  (1.753 m)   Wt 172 lb (78 kg)   SpO2 91%   BMI 25.40 kg/m   Visual Acuity Right Eye Distance:   Left Eye Distance:   Bilateral Distance:    Right Eye Near:   Left Eye Near:    Bilateral Near:     Physical Exam Constitutional:      Appearance: Normal appearance.  Musculoskeletal:       Back:     Comments: Back: Pain is palpated right L5/S1 area lateral paraspinous area, straight leg raise right.  Limited range of motion with pain on flexion, extension and rotation.  Patient is using a cane to help ambulate, and is walking with a limp.  Neurological:     Mental Status: He is alert.      UC Treatments / Results  Labs (all labs ordered are listed, but only abnormal results are displayed) Labs Reviewed - No data to display  EKG   Radiology No results found.  Procedures Procedures  (including critical care time)  Medications Ordered in UC Medications - No data to display  Initial Impression / Assessment and Plan / UC Course  I have reviewed the triage vital signs and the nursing notes.  Pertinent labs & imaging results that were available during my care of the patient were reviewed by me and considered in my medical decision making (see chart for details).    Plan: The diagnosis will be treated with the following: (Patient has ESRD and is on dialysis 3 times a week) 1.  Vicodin tablets 5/325, 1 every 6-8 hours as needed for pain #10 no refill. Prednisone 10 mg, 1 every 8 hours for 3 days only. Zanaflex 4 mg, 1 every 8 hours to help reduce muscle spasm. 2.  Internal referral has been made to [facility] neuro for evaluation of lower back pain. 3.  Patient advised follow-up PCP return to urgent care as needed. Final Clinical Impressions(s) / UC Diagnoses   Final diagnoses:  Chronic left-sided low back pain with left-sided sciatica  Muscle spasm of back     Discharge Instructions      Advised take Zanaflex 4 mg every 8-12 hours to help relieve muscle spasm and irritability. Advised take the hydrocodone 5/325 1 tablet every 6-8 hours as needed for pain relief. Advised take prednisone 10 mg 3 times a day 3 days only to help relieve acute inflammatory response.  Internal referral has been made to [facility] orthopedics, their office should be calling you within the next 48 to 72 hours to arrange appointment for evaluation.  Advised follow-up PCP return to urgent care as needed.    ED Prescriptions     Medication Sig Dispense Auth. Provider   tiZANidine (ZANAFLEX) 4 MG tablet Take 1 tablet (4 mg total) by mouth every 8 (eight) hours as needed for muscle spasms. 30 tablet Ellsworth Lennox, PA-C   HYDROcodone-acetaminophen (NORCO/VICODIN) 5-325 MG tablet Take 1 tablet by mouth every 6 (six) hours as needed for moderate pain. 12 tablet Ellsworth Lennox, PA-C    predniSONE (DELTASONE) 10 MG tablet Take 1 tablet (10 mg total) by mouth in the morning, at noon, and at bedtime. 10 tablet Ellsworth Lennox, PA-C      I have reviewed the  PDMP during this encounter.   Ellsworth Lennox, PA-C 07/30/22 1426

## 2022-08-10 ENCOUNTER — Other Ambulatory Visit: Payer: Self-pay | Admitting: Physician Assistant

## 2022-08-10 ENCOUNTER — Other Ambulatory Visit (HOSPITAL_COMMUNITY): Payer: Self-pay | Admitting: Nephrology

## 2022-08-10 DIAGNOSIS — N186 End stage renal disease: Secondary | ICD-10-CM

## 2022-08-10 DIAGNOSIS — Z992 Dependence on renal dialysis: Secondary | ICD-10-CM

## 2022-08-10 MED ORDER — PREDNISONE 50 MG PO TABS: ORAL_TABLET | ORAL | 0 refills | Status: AC

## 2022-08-10 MED ORDER — PREDNISONE 50 MG PO TABS: ORAL_TABLET | ORAL | 0 refills | Status: DC

## 2022-08-10 NOTE — Progress Notes (Signed)
  Patient for Shuntogram May 7.   Prednisone 50 mg called in to [facility] for premedication for  contrast allergy.  Avonte Sensabaugh S Tarnisha Kachmar PA-C 08/10/2022 12:40 PM

## 2022-08-14 ENCOUNTER — Other Ambulatory Visit: Payer: Self-pay | Admitting: Student

## 2022-08-14 DIAGNOSIS — Z992 Dependence on renal dialysis: Secondary | ICD-10-CM

## 2022-08-15 ENCOUNTER — Ambulatory Visit (HOSPITAL_COMMUNITY)
Admission: RE | Admit: 2022-08-15 | Discharge: 2022-08-15 | Disposition: A | Discharge status: Home / Self Care | Payer: Medicare Other | Source: Ambulatory Visit | Attending: Nephrology | Admitting: Nephrology

## 2022-08-15 ENCOUNTER — Encounter (HOSPITAL_COMMUNITY): Payer: Self-pay

## 2022-08-15 ENCOUNTER — Other Ambulatory Visit: Payer: Self-pay

## 2022-08-15 ENCOUNTER — Other Ambulatory Visit (HOSPITAL_COMMUNITY): Payer: Self-pay | Admitting: Nephrology

## 2022-08-15 DIAGNOSIS — T82858A Stenosis of vascular prosthetic devices, implants and grafts, initial encounter: Secondary | ICD-10-CM | POA: Insufficient documentation

## 2022-08-15 DIAGNOSIS — N186 End stage renal disease: Secondary | ICD-10-CM | POA: Diagnosis not present

## 2022-08-15 DIAGNOSIS — Z87891 Personal history of nicotine dependence: Secondary | ICD-10-CM | POA: Diagnosis not present

## 2022-08-15 DIAGNOSIS — Y841 Kidney dialysis as the cause of abnormal reaction of the patient, or of later complication, without mention of misadventure at the time of the procedure: Secondary | ICD-10-CM | POA: Insufficient documentation

## 2022-08-15 DIAGNOSIS — I120 Hypertensive chronic kidney disease with stage 5 chronic kidney disease or end stage renal disease: Secondary | ICD-10-CM | POA: Diagnosis not present

## 2022-08-15 DIAGNOSIS — Z992 Dependence on renal dialysis: Secondary | ICD-10-CM

## 2022-08-15 DIAGNOSIS — E1122 Type 2 diabetes mellitus with diabetic chronic kidney disease: Secondary | ICD-10-CM | POA: Insufficient documentation

## 2022-08-15 DIAGNOSIS — Z91041 Radiographic dye allergy status: Secondary | ICD-10-CM | POA: Insufficient documentation

## 2022-08-15 DIAGNOSIS — I2510 Atherosclerotic heart disease of native coronary artery without angina pectoris: Secondary | ICD-10-CM | POA: Diagnosis not present

## 2022-08-15 HISTORY — PX: IR AV DIALY SHUNT INTRO NEEDLE/INTRACATH INITIAL W/PTA/IMG RIGHT: IMG6116

## 2022-08-15 HISTORY — PX: IR US GUIDE VASC ACCESS RIGHT: IMG2390

## 2022-08-15 LAB — BASIC METABOLIC PANEL
Anion gap: 16 — ABNORMAL HIGH (ref 5–15)
BUN: 19 mg/dL (ref 8–23)
CO2: 25 mmol/L (ref 22–32)
Calcium: 8.3 mg/dL — ABNORMAL LOW (ref 8.9–10.3)
Chloride: 92 mmol/L — ABNORMAL LOW (ref 98–111)
Creatinine, Ser: 7.03 mg/dL — ABNORMAL HIGH (ref 0.61–1.24)
GFR, Estimated: 8 mL/min — ABNORMAL LOW (ref >60)
Glucose, Bld: 203 mg/dL — ABNORMAL HIGH (ref 70–99)
Potassium: 4.2 mmol/L (ref 3.5–5.1)
Sodium: 133 mmol/L — ABNORMAL LOW (ref 135–145)

## 2022-08-15 LAB — GLUCOSE, CAPILLARY
Glucose-Capillary: 201 mg/dL — ABNORMAL HIGH (ref 70–99)
Glucose-Capillary: 210 mg/dL — ABNORMAL HIGH (ref 70–99)

## 2022-08-15 MED ORDER — LIDOCAINE HCL 1 % IJ SOLN
INTRAMUSCULAR | Status: AC
  Filled 2022-08-15: qty 20

## 2022-08-15 MED ORDER — MIDAZOLAM HCL 2 MG/2ML IJ SOLN
INTRAMUSCULAR | Status: AC | PRN
  Administered 2022-08-15: 1 mg via INTRAVENOUS

## 2022-08-15 MED ORDER — MIDAZOLAM HCL 2 MG/2ML IJ SOLN
INTRAMUSCULAR | Status: AC
  Filled 2022-08-15: qty 2

## 2022-08-15 MED ORDER — HYDRALAZINE HCL 20 MG/ML IJ SOLN
INTRAMUSCULAR | Status: AC | PRN
  Administered 2022-08-15: 10 mg via INTRAVENOUS

## 2022-08-15 MED ORDER — CLONIDINE HCL 0.1 MG PO TABS
0.1000 mg | ORAL_TABLET | Freq: Once | ORAL | Status: AC
  Administered 2022-08-15: 0.1 mg via ORAL
  Filled 2022-08-15 (×2): qty 1

## 2022-08-15 MED ORDER — SODIUM CHLORIDE 0.9 % IV SOLN: INTRAVENOUS | Status: DC

## 2022-08-15 MED ORDER — HYDRALAZINE HCL 20 MG/ML IJ SOLN
INTRAMUSCULAR | Status: AC
  Filled 2022-08-15: qty 1

## 2022-08-15 MED ORDER — HYDRALAZINE HCL 20 MG/ML IJ SOLN: 10.0000 mg | Freq: Once | INTRAMUSCULAR | Status: AC

## 2022-08-15 MED ORDER — HYDRALAZINE HCL 20 MG/ML IJ SOLN
INTRAMUSCULAR | Status: AC
  Administered 2022-08-15: 10 mg
  Filled 2022-08-15: qty 1

## 2022-08-15 MED ORDER — FENTANYL CITRATE (PF) 100 MCG/2ML IJ SOLN
INTRAMUSCULAR | Status: AC | PRN
  Administered 2022-08-15: 25 ug via INTRAVENOUS

## 2022-08-15 MED ORDER — FENTANYL CITRATE (PF) 100 MCG/2ML IJ SOLN
INTRAMUSCULAR | Status: AC
  Filled 2022-08-15: qty 2

## 2022-08-15 NOTE — H&P (Signed)
Chief Complaint: Patient was seen in consultation today for right upper arm dialysis fistula evaluation and possible intervention.  Possible tunneled dialysis catheter placement at the request of Sanford,Ryan B  Referring Physician(s): Sanford,Ryan B  Supervising Physician: Mir, Secondary school teacher  Patient Status: [facility] - Out-pt  History of Present Illness: Jon Anderson is a 72 y.o. male   ESRD CAD; HTN Last intervention for fistula malfunction 11/15/21 IMPRESSION: 1. Left upper extremity AV graft evaluation shows stenoses of the arterial and venous anastomoses which were respectively treated with 6 and 8 mm balloons. 2. Procedure complicated by cardiac arrest requiring CPR and intubation. Patient transferred to the ICU for further management.  Right upper arm fistula placed in OR 02/2022 Has ben using this access since then Pt states-- dialysis center has trouble getting it to stop bleeding after dialysis Order says prolonged bleeding for reason for exam. Last dialysis yesterday-- complete  He has been pre medicated for dye allergy--- he did not take Benadryl.  Plan is to use CO2 in procedure today per Dr Bryn Gulling  Past Medical History:  Diagnosis Date   Anemia of chronic disease    Arthritis    CAD (coronary artery disease)    Carotid stenosis    a. <50% RICA, >70% LICA by duplex 07/2015   Chronic chest pain    occ   Constipation    chronic   Dyspnea    occasional with extertion   ESRD (end stage renal disease) on dialysis (HCC)    M-W-F- [name]   GERD (gastroesophageal reflux disease)    History of hiatal hernia    History of kidney stones    Passed   HNP (herniated nucleus pulposus), lumbar    HTN (hypertension)    echo 3/10: EF 60%, LAE   Hyperlipidemia    Mitral stenosis    mild MS 11/15/21   Nephrolithiasis    "passed them all"   PEA (Pulseless electrical activity) (HCC) 11/15/2021   PEA arrest felt likely related to contrast anaphylaxis despite  premedication   Peripheral arterial disease (HCC)    a. s/p PTCA  right    Pneumonia yrs ago   Restless legs    Sciatic leg pain    Sleep apnea    does not use cpap   Snores    a. presumed OSA, pt has refused sleep eval in past.   Type II diabetes mellitus (HCC)    no longer on medications, checks blood glucose at home   Urinary frequency    Uses wheelchair    Walker as ambulation aid     Past Surgical History:  Procedure Laterality Date   A/V FISTULAGRAM Left 01/10/2021   Procedure: A/V FISTULAGRAM;  Surgeon: Maeola Harman, MD;  Location: [facility] INVASIVE CV LAB;  Service: Cardiovascular;  Laterality: Left;   A/V FISTULAGRAM Left 05/19/2021   Procedure: A/V Fistulagram;  Surgeon: Cephus Shelling, MD;  Location: [facility] INVASIVE CV LAB;  Service: Cardiovascular;  Laterality: Left;   ABDOMINAL AORTOGRAM W/LOWER EXTREMITY Bilateral 08/21/2019   Procedure: ABDOMINAL AORTOGRAM W/LOWER EXTREMITY;  Surgeon: Maeola Harman, MD;  Location: [facility] INVASIVE CV LAB;  Service: Cardiovascular;  Laterality: Bilateral;   AMPUTATION Right 07/25/2019   Procedure: RIGHT 5th RAY AMPUTATION;  Surgeon: Nadara Mustard, MD;  Location: [facility] OR;  Service: Orthopedics;  Laterality: Right;   AMPUTATION Right 11/19/2019   Procedure: RIGHT TRANSMETATARSAL AMPUTATION;  Surgeon: Nadara Mustard, MD;  Location: [facility] OR;  Service: Orthopedics;  Laterality:  Right;   ANGIOPLASTY / STENTING FEMORAL Left 12/11/2013   dr berry   AV FISTULA PLACEMENT Left 03/19/2014   Procedure: CREATION OF ARTERIOVENOUS (AV) FISTULA  LEFT UPPER ARM;  Surgeon: Pryor Ochoa, MD;  Location: [facility] OR;  Service: Vascular;  Laterality: Left;   AV FISTULA PLACEMENT Left 11/09/2020   Procedure: LEFT ARM ARTERIOVENOUS (AV) FISTULA CREATION;  Surgeon: Maeola Harman, MD;  Location: [facility] OR;  Service: Vascular;  Laterality: Left;   AV FISTULA PLACEMENT Left 02/15/2021   Procedure: INSERTION OF LEFT ARM ARTERIOVENOUS GORE-TEX GRAFT;   Surgeon: Maeola Harman, MD;  Location: [facility] OR;  Service: Vascular;  Laterality: Left;   AV FISTULA PLACEMENT Right 02/16/2022   Procedure: RIGHT ARM BRACHIOCEPAHLIC ARTERIOVENOUS (AV) FISTULA CREATION;  Surgeon: Maeola Harman, MD;  Location: [facility] OR;  Service: Vascular;  Laterality: Right;  Regional and MAC   BACK SURGERY  01/2018   screws placed    BASCILIC VEIN TRANSPOSITION Left 12/14/2020   Procedure: REVISION OF LEFT ARM SECOND STAGE BASILIC VEIN TRANSPOSITION;  Surgeon: Maeola Harman, MD;  Location: [facility] OR;  Service: Vascular;  Laterality: Left;   BASCILIC VEIN TRANSPOSITION Right 04/18/2022   Procedure: RIGHT BRACHIOBASILIC FISTULA SECOND STAGE TRANSPOSITION;  Surgeon: Maeola Harman, MD;  Location: [facility] OR;  Service: Vascular;  Laterality: Right;   CARDIAC CATHETERIZATION  2001 and 2010    COLONOSCOPY W/ BIOPSIES AND POLYPECTOMY     COLONOSCOPY WITH PROPOFOL N/A 08/01/2016   Procedure: COLONOSCOPY WITH PROPOFOL;  Surgeon: Jeani Hawking, MD;  Location: [facility] ENDOSCOPY;  Service: Endoscopy;  Laterality: N/A;   COLONOSCOPY WITH PROPOFOL N/A 01/17/2022   Procedure: COLONOSCOPY WITH PROPOFOL;  Surgeon: Jeani Hawking, MD;  Location: [facility] ENDOSCOPY;  Service: Gastroenterology;  Laterality: N/A;   CORONARY ARTERY BYPASS GRAFT  2019   [facility] x 1 bypass   ESOPHAGOGASTRODUODENOSCOPY (EGD) WITH PROPOFOL N/A 08/01/2016   Procedure: ESOPHAGOGASTRODUODENOSCOPY (EGD) WITH PROPOFOL;  Surgeon: Jeani Hawking, MD;  Location: [facility]COPY;  Service: Endoscopy;  Laterality: N/A;   FISTULA SUPERFICIALIZATION Left 02/10/2020   Procedure: 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;  Surgeon: Larina Earthly, MD;  Location: [facility] OR;  Service: Vascular;  Laterality: Left;   FISTULA SUPERFICIALIZATION Left 03/23/2020   Procedure: LEFT UPPER EXTREMITY ARTERIOVENOUS FISTULA PLICATION;  Surgeon: Larina Earthly, MD;  Location: [facility] OR;  Service: Vascular;  Laterality: Left;   FOOT  FRACTURE SURGERY Right    ligament repair   FRACTURE SURGERY     left forearm   GRAFT APPLICATION Right 05/13/2019   Procedure: FAT GRAFT APPLICATION;  Surgeon: Park Liter, DPM;  Location: [facility];  Service: Podiatry;  Laterality: Right;   HEMATOMA EVACUATION Left 11/09/2020   Procedure: EVACUATION HEMATOMA LEFT ARM;  Surgeon: Chuck Hint, MD;  Location: [facility] OR;  Service: Vascular;  Laterality: Left;   INGUINAL HERNIA REPAIR Left    IR AV DIALY SHUNT INTRO NEEDLE/INTRACATH INITIAL W/PTA/IMG LEFT  11/15/2021   IR FLUORO GUIDE CV LINE RIGHT  01/20/2022   IR FLUORO GUIDE CV LINE RIGHT  05/03/2022   IR REMOVAL TUN CV CATH W/O FL  06/16/2021   IR REMOVAL TUN CV CATH W/O FL  07/06/2022   IR US GUIDE VASC ACCESS LEFT  11/15/2021   IR US GUIDE VASC ACCESS LEFT  11/15/2021   IR US GUIDE VASC ACCESS RIGHT  11/15/2021   IR US GUIDE VASC ACCESS RIGHT  01/20/2022   LEFT HEART CATH AND CORS/GRAFTS  ANGIOGRAPHY N/A 04/27/2017   Procedure: LEFT HEART CATH AND CORS/GRAFTS ANGIOGRAPHY;  Surgeon: Marykay Lex, MD;  Location: [facility] INVASIVE CV LAB;  Service: Cardiovascular;  Laterality: N/A;   LEFT HEART CATHETERIZATION WITH CORONARY ANGIOGRAM N/A 06/22/2014   Procedure: LEFT HEART CATHETERIZATION WITH CORONARY ANGIOGRAM;  Surgeon: Lennette Bihari, MD;  Location: [facility] CATH LAB;  Service: Cardiovascular;  Laterality: N/A;   LOWER EXTREMITY ANGIOGRAM Left 12/11/2013   Procedure: LOWER EXTREMITY ANGIOGRAM;  Surgeon: Runell Gess, MD;  Location: [facility] CATH LAB;  Service: Cardiovascular;  Laterality: Left;   LUMBAR LAMINECTOMY/DECOMPRESSION MICRODISCECTOMY Right 07/03/2017   Procedure: MICRODISCECTOMY LUMBAR FIVE - SACRAL ONE RIGHT;  Surgeon: Lisbeth Renshaw, MD;  Location: [facility] OR;  Service: Neurosurgery;  Laterality: Right;   LUMBAR LAMINECTOMY/DECOMPRESSION MICRODISCECTOMY Right 10/19/2017   Procedure: MICRODISCECTOMY LUMBAR FIVE- SACRAL 1 ONE ;  Surgeon: Lisbeth Renshaw, MD;  Location: [facility] OR;   Service: Neurosurgery;  Laterality: Right;   PERIPHERAL VASCULAR BALLOON ANGIOPLASTY Left 05/19/2021   Procedure: PERIPHERAL VASCULAR BALLOON ANGIOPLASTY;  Surgeon: Cephus Shelling, MD;  Location: [facility] INVASIVE CV LAB;  Service: Cardiovascular;  Laterality: Left;   PERIPHERAL VASCULAR INTERVENTION Left 01/10/2021   Procedure: PERIPHERAL VASCULAR INTERVENTION;  Surgeon: Maeola Harman, MD;  Location: [facility] INVASIVE CV LAB;  Service: Cardiovascular;  Laterality: Left;   POLYPECTOMY  01/17/2022   Procedure: POLYPECTOMY;  Surgeon: Jeani Hawking, MD;  Location: [facility];  Service: Gastroenterology;;   THROMBECTOMY W/ EMBOLECTOMY Left 11/09/2020   Procedure: THROMBECTOMY OF LEFT ARTERIOVENOUS FISTULA;  Surgeon: Chuck Hint, MD;  Location: [facility] OR;  Service: Vascular;  Laterality: Left;   TONSILLECTOMY AND ADENOIDECTOMY     WISDOM TOOTH EXTRACTION     WOUND DEBRIDEMENT Right 05/13/2019   Procedure: DEBRIDEMENT WOUND;  Surgeon: Park Liter, DPM;  Location: [facility];  Service: Podiatry;  Laterality: Right;    Allergies: Contrast media [iodinated contrast media], Kiwi extract, Flexeril [cyclobenzaprine], and Tape  Medications: Prior to Admission medications   Medication Sig Start Date End Date Taking? Authorizing Provider  acetaminophen (TYLENOL) 500 MG tablet Take 1,000 mg by mouth every 6 (six) hours as needed for moderate pain.   Yes [provider]  atorvastatin (LIPITOR) 40 MG tablet Take 1 tablet (40 mg total) by mouth daily at 6 PM. Patient taking differently: Take 40 mg by mouth at bedtime. 12/01/19  Yes AngiulliMcarthur Rossetti, PA-C  CALCITRIOL PO Take 1 tablet by mouth every Monday, Wednesday, and Friday.   Yes [provider]  calcium acetate (PHOSLO) 667 MG capsule Take 2 capsules (1,334 mg total) by mouth 2 (two) times daily with a meal. 12/01/19  Yes Angiulli, Mcarthur Rossetti, PA-C  gabapentin (NEURONTIN) 100 MG capsule Take 100-200 mg by  mouth See admin instructions. 100 mg in the morning, 200 mg in the evening 11/28/19  Yes [provider]  HYDROcodone-acetaminophen (NORCO/VICODIN) 5-325 MG tablet Take 1 tablet by mouth every 6 (six) hours as needed for moderate pain. 07/30/22  Yes Ellsworth Lennox, PA-C  lidocaine (LIDODERM) 5 % Place 1 patch onto the skin daily. Remove & Discard patch within 12 hours or as directed by MD 07/22/22  Yes Gwyneth Sprout, MD  midodrine (PROAMATINE) 10 MG tablet Take 10 mg by mouth in the morning and at bedtime. 12/01/19  Yes [provider]  multivitamin (RENA-VIT) TABS tablet Take 1 tablet by mouth at bedtime. 12/01/19  Yes Angiulli, Mcarthur Rossetti, PA-C  oxyCODONE-acetaminophen (PERCOCET/ROXICET) 5-325 MG tablet Take 1 tablet by  mouth every 6 (six) hours as needed for severe pain. 07/22/22  Yes Gwyneth Sprout, MD  pantoprazole (PROTONIX) 40 MG tablet Take 40 mg by mouth daily before breakfast.   Yes [provider]  predniSONE (DELTASONE) 50 MG tablet Take one tablet by mouth 13 hours prior to procedure, take one tablet my mouth 7 hours prior to procedure, bring the other tablet with you to take 1 hour prior to procedure. 08/14/22 08/15/22 Yes Gershon Crane, PA-C  tiZANidine (ZANAFLEX) 4 MG tablet Take 1 tablet (4 mg total) by mouth every 8 (eight) hours as needed for muscle spasms. 07/30/22  Yes Ellsworth Lennox, PA-C  aspirin EC 81 MG tablet Take 1 tablet (81 mg total) by mouth daily. Patient taking differently: Take 81 mg by mouth at bedtime. 05/28/12   Tereso Newcomer T, PA-C  clopidogrel (PLAVIX) 75 MG tablet Take 75 mg by mouth in the morning. 12/01/19   [provider]  loperamide (IMODIUM) 2 MG capsule Take 2 mg by mouth as needed for diarrhea or loose stools.    [provider]  [facility] ULTRA test strip See admin instructions. 01/14/21   [provider]  predniSONE (DELTASONE) 10 MG tablet Take 1 tablet (10 mg total) by mouth in the morning, at noon, and  at bedtime. 07/30/22   Ellsworth Lennox, PA-C     Family History  Problem Relation Age of Onset   Heart attack Sister        died @ 24   Cancer Mother        died @ 30; unknown type   Diabetes Brother        deceased   Cirrhosis Father        alcohol related   Diabetes Father    Esophageal cancer Neg Hx    Colon cancer Neg Hx    Pancreatic cancer Neg Hx    Stomach cancer Neg Hx     Social History   Socioeconomic History   Marital status: Married    Spouse name: Not on file   Number of children: Not on file   Years of education: Not on file   Highest education level: Not on file  Occupational History   Occupation: retired  Tobacco Use   Smoking status: Former    Packs/day: 1.00    Years: 2.00    Additional pack years: 0.00    Total pack years: 2.00    Types: Cigarettes    Quit date: 1978    Years since quitting: 46.3    Passive exposure: Never   Smokeless tobacco: Never  Vaping Use   Vaping Use: Never used  Substance and Sexual Activity   Alcohol use: Not Currently    Alcohol/week: 0.0 standard drinks of alcohol    Comment: h/o social drinking   Drug use: Not Currently    Types: Marijuana    Comment: quit 1986   Sexual activity: Yes    Birth control/protection: None  Other Topics Concern   Not on file  Social History Narrative   The patient is a Copy.  He is married and has 2 grown children.  Lives in [place] with his wife.  Denies tobacco, alcohol or IV drug abuse or marijuana or cocaine intake.    Social Determinants of Health   Financial Resource Strain: Not on file  Food Insecurity: Not on file  Transportation Needs: Not on file  Physical Activity: Not on file  Stress: Not on file  Social Connections: Not  on file    Review of Systems: A 12 point ROS discussed and pertinent positives are indicated in the HPI above.  All other systems are negative.  Review of Systems  Constitutional:  Negative for activity change, fatigue and fever.  Respiratory:   Negative for cough and shortness of breath.   Cardiovascular:  Negative for chest pain.  Gastrointestinal:  Negative for abdominal pain.  Psychiatric/Behavioral:  Negative for behavioral problems and confusion.     Vital Signs: BP (!) 194/98   Pulse 91   Temp 97.7 F (36.5 C) (Temporal)   Resp 17   Ht 5\' 9"  (1.753 m)   Wt 172 lb (78 kg)   SpO2 95%   BMI 25.40 kg/m    Physical Exam Vitals reviewed.  HENT:     Mouth/Throat:     Mouth: Mucous membranes are moist.  Cardiovascular:     Rate and Rhythm: Normal rate and regular rhythm.     Heart sounds: Normal heart sounds.  Pulmonary:     Effort: Pulmonary effort is normal.     Breath sounds: Normal breath sounds.  Abdominal:     Palpations: Abdomen is soft.  Musculoskeletal:     Comments: Right upper arm dialysis fistula: Good pulse Good thrill  Skin:    General: Skin is warm.  Neurological:     Mental Status: He is alert and oriented to person, place, and time.     Imaging: XR HIP UNILAT W OR W/O PELVIS 2-3 VIEWS RIGHT  Result Date: 07/30/2022 2 view radiographs of the right hip shows osteoarthritis with periarticular bony spurs subcondylar cyst and sclerosis. Visualized lumbar spine shows previous fusion through the lower lumbar spine.  XR Knee 1-2 Views Right  Result Date: 07/30/2022 2 view radiographs of the right knee shows congruent joint space with calcified popliteal artery and popliteal stent.  MR LUMBAR SPINE WO CONTRAST  Result Date: 07/22/2022 CLINICAL DATA:  Low back pain with symptoms persisting over 6 weeks of treatment EXAM: MRI LUMBAR SPINE WITHOUT CONTRAST TECHNIQUE: Multiplanar, multisequence MR imaging of the lumbar spine was performed. No intravenous contrast was administered. COMPARISON:  01/13/2018 FINDINGS: Segmentation: Standard. Mild dextroscoliosis. Mild fused anterolisthesis at L4-5 and L5-S1 Alignment:  Mild scoliosis Vertebrae: No fracture, evidence of discitis, or bone lesion. L4-5 and  L5-S1 PLIF. Remote L3 inferior endplate fracture. Conus medullaris and cauda equina: Conus extends to the L1-2 level. Conus and cauda equina appear normal. Paraspinal and other soft tissues: Symmetric renal atrophy and dialysis related cystic disease. Disc levels: T12- L1: Unremarkable. L1-L2: Unremarkable. L2-L3: Mild disc height loss and bulging. L3-L4: Disc narrowing and bulging with endplate degeneration eccentric to the left. Degenerative facet spurring asymmetric to the left where there is mild foraminal stenosis L4-L5: PLIF. Disc narrowing, facet spurring, and residual disc material causes left foraminal impingement. L5-S1:PLIF. Cage subsidence and residual extruded disc causes biforaminal impingement when combined with facet spurs. Patent spinal canal. IMPRESSION: 1. No acute finding. 2. L4-5 left and L5-S1 bilateral foraminal impingement despite PLIF. Electronically Signed   By: Tiburcio Pea M.D.   On: 07/22/2022 11:50   DG Chest 2 View  Result Date: 07/22/2022 CLINICAL DATA:  cp EXAM: CHEST - 2 VIEW COMPARISON:  07/03/2022. FINDINGS: The heart size and mediastinal contours are within normal limits. Both lungs are clear. No pneumothorax or pleural effusion. Aorta is calcified. There are thoracic degenerative changes. IMPRESSION: No active cardiopulmonary disease. Electronically Signed   By: Layla Maw M.D.   On:  07/22/2022 09:36    Labs:  CBC: Recent Labs    11/17/21 0736 11/18/21 0853 11/26/21 0850 01/17/22 1138 01/20/22 1346 02/16/22 0722 04/18/22 1041 07/22/22 0915  WBC 13.6* 10.0 7.5  --   --   --   --  7.1  HGB 12.3* 10.8* 12.8*   < > 12.2* 11.9* 12.6* 10.9*  HCT 35.9* 33.2* 39.7   < > 36.0* 35.0* 37.0* 34.9*  PLT 153 142* 235  --   --   --   --  272   < > = values in this interval not displayed.    COAGS: Recent Labs    01/20/22 1254  INR 1.0    BMP: Recent Labs    11/17/21 0736 11/18/21 0853 11/26/21 0850 01/17/22 1138 01/20/22 1346 02/16/22 0722  04/18/22 1041 07/22/22 0915  NA 132* 134* 137   < > 137 137 136 135  K 5.2* 3.5 4.0   < > 4.0 3.6 3.8 3.5  CL 93* 96* 95*   < > 98 97* 95* 90*  CO2 27 25 30   --   --   --   --  29  GLUCOSE 139* 129* 109*   < > 89 112* 117* 132*  BUN 34* 49* 21   < > 54* 23 24* 26*  CALCIUM 8.8* 7.9* 9.1  --   --   --   --  8.2*  CREATININE 7.80* 9.36* 7.87*   < > 17.30* 7.50* 9.10* 8.52*  GFRNONAA 7* 5* 7*  --   --   --   --  6*   < > = values in this interval not displayed.    LIVER FUNCTION TESTS: Recent Labs    11/15/21 1314 11/18/21 0853 11/26/21 0850  BILITOT 0.5  --  0.7  AST 13*  --  20  ALT 9  --  20  ALKPHOS 64  --  82  PROT 4.4*  --  6.9  ALBUMIN 2.3* 2.8* 3.7    TUMOR MARKERS: No results for input(s): "AFPTM", "CEA", "CA199", "CHROMGRNA" in the last 8760 hours.  Assessment and Plan:  Scheduled today for right arm dialysis fistula evaluation/intervention. Possible tunneled catheter placement Risks and benefits discussed with the patient including, but not limited to bleeding, infection, vascular injury, pulmonary embolism, need for tunneled HD catheter placement or even death.  All of the patient's questions were answered, patient is agreeable to proceed. Consent signed and in chart.  Thank you for this interesting consult.  I greatly enjoyed meeting ZHYON MODESTE and look forward to participating in their care.  A copy of this report was sent to the requesting provider on this date.  Electronically Signed: Robet Leu, PA-C 08/15/2022, 10:57 AM   I spent a total of    25 Minutes in face to face in clinical consultation, greater than 50% of which was counseling/coordinating care for right arm dialysis fistula evaluation/intervention; poss tunneled dialysis catheter placement

## 2022-08-15 NOTE — Progress Notes (Addendum)
Brief Interventional Radiology Note:  Patient presented to IR today for RUE fistulogram. RN notified that pt has persistent elevated BP 206/100, 203/105. Pt was given IV hydralazine prior to procedure and post procedure for elevated pressure. Dr. Marisue Humble, [facility] was contacted about BP management and recommends giving 0.1 mg clonidine. He states he will see pt at dialysis tomorrow.  This information was conveyed to Kenedy, RN caring for pt today. Kenney Houseman will educate pt if he experiences SOB, CP or other unusual symptoms he needs to be see in Emergency Dept.  Dr. Bryn Gulling, IR, was also made aware of recommendations for pt.     Alex Gardener, AGNP-BC 08/15/2022, 3:41 PM

## 2022-08-15 NOTE — Procedures (Signed)
Interventional Radiology Procedure Note  Procedure: Right upper extremity fistulagram  Indication: Poor flow during HD  Findings: Please refer to procedural dictation for full description.  Complications: None  EBL: < 10 mL  Acquanetta Belling, MD [phone]

## 2022-08-17 ENCOUNTER — Ambulatory Visit (INDEPENDENT_AMBULATORY_CARE_PROVIDER_SITE_OTHER): Payer: Medicare Other | Admitting: Orthopedic Surgery

## 2022-08-17 DIAGNOSIS — M25551 Pain in right hip: Secondary | ICD-10-CM

## 2022-08-17 DIAGNOSIS — M25561 Pain in right knee: Secondary | ICD-10-CM | POA: Diagnosis not present

## 2022-08-28 ENCOUNTER — Encounter: Payer: Self-pay | Admitting: Orthopedic Surgery

## 2022-08-28 NOTE — Progress Notes (Signed)
Office Visit Note   Patient: Jon Anderson           Date of Birth: Oct 19, 1950           MRN: [id number] Visit Date: 08/17/2022              Requested by: Ralene Ok, MD [id number] [place],  [place] [address] PCP: Ralene Ok, MD  Chief Complaint  Patient presents with   Right Knee - Follow-up      HPI: Patient is a 72 year old gentleman who presents in follow-up for right knee and right hip pain.  Patient is status post a right transmetatarsal amputation.  Patient's last ankle-brachial indices was in January recommended patient follow-up with cardiology.  Patient is status post right knee injection April 9.  Patient states that his knee is asymptomatic he states he still having pain from his back down to his foot.  Assessment & Plan: Visit Diagnoses:  1. Pain in right hip   2. Acute pain of right knee     Plan: Plan: Patient will follow-up with neurosurgery for injection for the radicular symptoms.  Follow-Up Instructions: Return if symptoms worsen or fail to improve.   Ortho Exam  Patient is alert, oriented, no adenopathy, well-dressed, normal affect, normal respiratory effort. Examination of the right hip patient has internal/external rotation of 45 degrees.  He does have an abductor lurch with ambulation.  Patient has numbness in the right foot he has a negative straight leg raise no focal motor weakness.  Imaging: No results found. No images are attached to the encounter.  Labs: Lab Results  Component Value Date   HGBA1C 7.6 (H) 11/15/2021   HGBA1C 6.4 (H) 07/24/2019   HGBA1C 6.5 (H) 07/18/2018   ESRSEDRATE 25 (H) 07/22/2022   ESRSEDRATE 22 (H) 07/24/2019   CRP 0.7 07/22/2022   CRP <0.5 07/24/2019   REPTSTATUS 11/18/2021 FINAL 11/16/2021   GRAMSTAIN  11/16/2021    ABUNDANT WBC PRESENT, PREDOMINANTLY PMN ABUNDANT GRAM NEGATIVE RODS ABUNDANT GRAM POSITIVE COCCI IN PAIRS    CULT  11/16/2021    MODERATE Normal respiratory flora-no Staph aureus or  Pseudomonas seen Performed at [facility], [address] [place], [place] [address]      Lab Results  Component Value Date   ALBUMIN 3.7 11/26/2021   ALBUMIN 2.8 (L) 11/18/2021   ALBUMIN 2.3 (L) 11/15/2021   PREALBUMIN 28.0 07/24/2019    Lab Results  Component Value Date   MG 1.9 11/17/2021   MG 2.3 11/16/2021   MG 1.3 (L) 11/15/2021   No results found for: "VD25OH"  Lab Results  Component Value Date   PREALBUMIN 28.0 07/24/2019      Latest Ref Rng & Units 07/22/2022    9:15 AM 04/18/2022   10:41 AM 02/16/2022    7:22 AM  CBC EXTENDED  WBC 4.0 - 10.5 K/uL 7.1     RBC 4.22 - 5.81 MIL/uL 3.86     Hemoglobin 13.0 - 17.0 g/dL 82.9  56.2  13.0   HCT 39.0 - 52.0 % 34.9  37.0  35.0   Platelets 150 - 400 K/uL 272        There is no height or weight on file to calculate BMI.  Orders:  No orders of the defined types were placed in this encounter.  No orders of the defined types were placed in this encounter.    Procedures: No procedures performed  Clinical Data: No additional findings.  ROS:  All other systems negative, except as noted in the HPI. Review of Systems  Objective: Vital Signs: There were no vitals taken for this visit.  Specialty Comments:  No specialty comments available.  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) 12/10/2019   Type 2 diabetes mellitus with diabetic chronic kidney disease (HCC) 12/03/2019   Right below-knee amputee (HCC) 11/25/2019   S/P transmetatarsal amputation of foot, right (HCC) 11/25/2019   Gangrene of right foot (HCC) 11/19/2019   History of partial ray amputation of fifth toe of right foot (HCC) 09/09/2019   Ischemic ulcer diabetic foot (HCC) 09/09/2019   Subacute osteomyelitis, right ankle and foot (HCC)    Diabetic foot infection  (HCC) 07/24/2019   Chronic diastolic CHF (congestive heart failure) (HCC) 07/24/2019   Diabetic ulcer of right midfoot associated with diabetes mellitus due to underlying condition, with fat layer exposed (HCC)    Gastroenteritis 04/24/2019   Presence of aortocoronary bypass graft 04/11/2019   Long term (current) use of aspirin 04/11/2019   Dependence on renal dialysis (HCC) 04/11/2019   Other constipation 04/11/2019   Allergy, unspecified, initial encounter 01/27/2019   DM neuropathy with neurologic complication (HCC) 02/28/2018   GERD (gastroesophageal reflux disease) 02/28/2018   Nephrolithiasis 02/28/2018   Sciatic leg pain 02/28/2018   Snores 02/28/2018   Orthopnea 02/23/2018   Elevated troponin 02/23/2018   HNP (herniated nucleus pulposus), lumbar 07/03/2017   Encounter for removal of sutures 06/01/2017   Chest pain in adult 04/27/2017   Restless leg syndrome, uncontrolled 04/27/2017   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/[facility] 03/17/2017   Anemia of chronic disease 03/17/2017   ESRD (end stage renal disease) (HCC) 03/17/2017   Chronic chest pain 03/17/2017   Type II diabetes mellitus (HCC) 03/17/2017   Acute on chronic diastolic heart failure (HCC) 03/17/2017   Acute heart failure (HCC) 03/17/2017   Lumbar radiculopathy 01/16/2017   Pre-transplant evaluation for kidney transplant 06/15/2016   Pain, unspecified 04/17/2016   Nocturia 12/17/2015   Chest pain, non-cardiac 10/09/2015   Hypercalcemia 05/10/2015   Other fluid overload 02/24/2015   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) 10/03/2014   Fatty (change of) liver, not elsewhere classified 07/02/2014   Aftercare including intermittent dialysis (HCC) 06/24/2014   Diarrhea, unspecified 06/24/2014   Fever, unspecified 06/24/2014   Iron deficiency anemia, unspecified 06/24/2014   Other specified coagulation defects (HCC) 06/24/2014   Pruritus, unspecified 06/24/2014   Secondary hyperparathyroidism of  renal origin (HCC) 06/24/2014   Type 2 diabetes mellitus with diabetic peripheral angiopathy without gangrene (HCC) 06/24/2014   Acute on chronic renal failure (HCC) 06/16/2014   Shoulder pain, left 12/15/2013   Chest pain 12/15/2013   Claudication (HCC) 12/11/2013   PVD (peripheral vascular disease) (HCC) 12/11/2013   Carotid artery disease (HCC) 09/30/2013   Acute chest pain 11/15/2012   Bruit 09/15/2010   CAD (coronary artery disease) nonobstructive per cath 2012    Hyperlipidemia LDL goal <70 07/22/2009   Essential hypertension 07/22/2009   Past Medical History:  Diagnosis Date   Anemia of chronic disease    Arthritis    CAD (coronary artery disease)    Carotid stenosis    a. <50% RICA, >70% LICA by duplex 07/2015   Chronic chest pain    occ   Constipation  chronic   Dyspnea    occasional with extertion   ESRD (end stage renal disease) on dialysis (HCC)    M-W-F- [name]   GERD (gastroesophageal reflux disease)    History of hiatal hernia    History of kidney stones    Passed   HNP (herniated nucleus pulposus), lumbar    HTN (hypertension)    echo 3/10: EF 60%, LAE   Hyperlipidemia    Mitral stenosis    mild MS 11/15/21   Nephrolithiasis    "passed them all"   PEA (Pulseless electrical activity) (HCC) 11/15/2021   PEA arrest felt likely related to contrast anaphylaxis despite premedication   Peripheral arterial disease (HCC)    a. s/p PTCA  right    Pneumonia yrs ago   Restless legs    Sciatic leg pain    Sleep apnea    does not use cpap   Snores    a. presumed OSA, pt has refused sleep eval in past.   Type II diabetes mellitus (HCC)    no longer on medications, checks blood glucose at home   Urinary frequency    Uses wheelchair    Walker as ambulation aid     Family History  Problem Relation Age of Onset   Heart attack Sister        died @ 75   Cancer Mother        died @ 18; unknown type   Diabetes Brother        deceased   Cirrhosis Father         alcohol related   Diabetes Father    Esophageal cancer Neg Hx    Colon cancer Neg Hx    Pancreatic cancer Neg Hx    Stomach cancer Neg Hx     Past Surgical History:  Procedure Laterality Date   A/V FISTULAGRAM Left 01/10/2021   Procedure: A/V FISTULAGRAM;  Surgeon: Maeola Harman, MD;  Location: [facility] INVASIVE CV LAB;  Service: Cardiovascular;  Laterality: Left;   A/V FISTULAGRAM Left 05/19/2021   Procedure: A/V Fistulagram;  Surgeon: Cephus Shelling, MD;  Location: [facility] INVASIVE CV LAB;  Service: Cardiovascular;  Laterality: Left;   ABDOMINAL AORTOGRAM W/LOWER EXTREMITY Bilateral 08/21/2019   Procedure: ABDOMINAL AORTOGRAM W/LOWER EXTREMITY;  Surgeon: Maeola Harman, MD;  Location: [facility] INVASIVE CV LAB;  Service: Cardiovascular;  Laterality: Bilateral;   AMPUTATION Right 07/25/2019   Procedure: RIGHT 5th RAY AMPUTATION;  Surgeon: Nadara Mustard, MD;  Location: [facility] OR;  Service: Orthopedics;  Laterality: Right;   AMPUTATION Right 11/19/2019   Procedure: RIGHT TRANSMETATARSAL AMPUTATION;  Surgeon: Nadara Mustard, MD;  Location: [facility] OR;  Service: Orthopedics;  Laterality: Right;   ANGIOPLASTY / STENTING FEMORAL Left 12/11/2013   dr berry   AV FISTULA PLACEMENT Left 03/19/2014   Procedure: CREATION OF ARTERIOVENOUS (AV) FISTULA  LEFT UPPER ARM;  Surgeon: Pryor Ochoa, MD;  Location: [facility] OR;  Service: Vascular;  Laterality: Left;   AV FISTULA PLACEMENT Left 11/09/2020   Procedure: LEFT ARM ARTERIOVENOUS (AV) FISTULA CREATION;  Surgeon: Maeola Harman, MD;  Location: [facility] OR;  Service: Vascular;  Laterality: Left;   AV FISTULA PLACEMENT Left 02/15/2021   Procedure: INSERTION OF LEFT ARM ARTERIOVENOUS GORE-TEX GRAFT;  Surgeon: Maeola Harman, MD;  Location: [facility] OR;  Service: Vascular;  Laterality: Left;   AV FISTULA PLACEMENT Right 02/16/2022   Procedure: RIGHT ARM BRACHIOCEPAHLIC ARTERIOVENOUS (AV) FISTULA CREATION;  Surgeon: Maeola Harman, MD;  Location: [facility] OR;  Service: Vascular;  Laterality: Right;  Regional and MAC   BACK SURGERY  01/2018   screws placed    BASCILIC VEIN TRANSPOSITION Left 12/14/2020   Procedure: REVISION OF LEFT ARM SECOND STAGE BASILIC VEIN TRANSPOSITION;  Surgeon: Maeola Harman, MD;  Location: [facility] OR;  Service: Vascular;  Laterality: Left;   BASCILIC VEIN TRANSPOSITION Right 04/18/2022   Procedure: RIGHT BRACHIOBASILIC FISTULA SECOND STAGE TRANSPOSITION;  Surgeon: Maeola Harman, MD;  Location: [facility] OR;  Service: Vascular;  Laterality: Right;   CARDIAC CATHETERIZATION  2001 and 2010    COLONOSCOPY W/ BIOPSIES AND POLYPECTOMY     COLONOSCOPY WITH PROPOFOL N/A 08/01/2016   Procedure: COLONOSCOPY WITH PROPOFOL;  Surgeon: Jeani Hawking, MD;  Location: [facility] ENDOSCOPY;  Service: Endoscopy;  Laterality: N/A;   COLONOSCOPY WITH PROPOFOL N/A 01/17/2022   Procedure: COLONOSCOPY WITH PROPOFOL;  Surgeon: Jeani Hawking, MD;  Location: [facility];  Service: Gastroenterology;  Laterality: N/A;   CORONARY ARTERY BYPASS GRAFT  2019   [facility] x 1 bypass   ESOPHAGOGASTRODUODENOSCOPY (EGD) WITH PROPOFOL N/A 08/01/2016   Procedure: ESOPHAGOGASTRODUODENOSCOPY (EGD) WITH PROPOFOL;  Surgeon: Jeani Hawking, MD;  Location: [facility];  Service: Endoscopy;  Laterality: N/A;   FISTULA SUPERFICIALIZATION Left 02/10/2020   Procedure: 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;  Surgeon: Larina Earthly, MD;  Location: [facility] OR;  Service: Vascular;  Laterality: Left;   FISTULA SUPERFICIALIZATION Left 03/23/2020   Procedure: LEFT UPPER EXTREMITY ARTERIOVENOUS FISTULA PLICATION;  Surgeon: Larina Earthly, MD;  Location: [facility] OR;  Service: Vascular;  Laterality: Left;   FOOT FRACTURE SURGERY Right    ligament repair   FRACTURE SURGERY     left forearm   GRAFT APPLICATION Right 05/13/2019   Procedure: FAT GRAFT APPLICATION;  Surgeon: Park Liter, DPM;  Location: [facility];  Service: Podiatry;  Laterality:  Right;   HEMATOMA EVACUATION Left 11/09/2020   Procedure: EVACUATION HEMATOMA LEFT ARM;  Surgeon: Chuck Hint, MD;  Location: [facility] OR;  Service: Vascular;  Laterality: Left;   INGUINAL HERNIA REPAIR Left    IR AV DIALY SHUNT INTRO NEEDLE/INTRACATH INITIAL W/PTA/IMG LEFT  11/15/2021   IR AV DIALY SHUNT INTRO NEEDLE/INTRACATH INITIAL W/PTA/IMG RIGHT Right 08/15/2022   IR FLUORO GUIDE CV LINE RIGHT  01/20/2022   IR FLUORO GUIDE CV LINE RIGHT  05/03/2022   IR REMOVAL TUN CV CATH W/O FL  06/16/2021   IR REMOVAL TUN CV CATH W/O FL  07/06/2022   IR US GUIDE VASC ACCESS LEFT  11/15/2021   IR US GUIDE VASC ACCESS LEFT  11/15/2021   IR US GUIDE VASC ACCESS RIGHT  11/15/2021   IR US GUIDE VASC ACCESS RIGHT  01/20/2022   IR US GUIDE VASC ACCESS RIGHT  08/15/2022   LEFT HEART CATH AND CORS/GRAFTS ANGIOGRAPHY N/A 04/27/2017   Procedure: LEFT HEART CATH AND CORS/GRAFTS ANGIOGRAPHY;  Surgeon: Marykay Lex, MD;  Location: [facility] INVASIVE CV LAB;  Service: Cardiovascular;  Laterality: N/A;   LEFT HEART CATHETERIZATION WITH CORONARY ANGIOGRAM N/A 06/22/2014   Procedure: LEFT HEART CATHETERIZATION WITH CORONARY ANGIOGRAM;  Surgeon: Lennette Bihari, MD;  Location: [facility] CATH LAB;  Service: Cardiovascular;  Laterality: N/A;   LOWER EXTREMITY ANGIOGRAM Left 12/11/2013   Procedure: LOWER EXTREMITY ANGIOGRAM;  Surgeon: Runell Gess, MD;  Location: [facility] CATH LAB;  Service: Cardiovascular;  Laterality: Left;   LUMBAR LAMINECTOMY/DECOMPRESSION MICRODISCECTOMY Right 07/03/2017   Procedure: MICRODISCECTOMY LUMBAR FIVE - SACRAL ONE RIGHT;  Surgeon:  Lisbeth Renshaw, MD;  Location: [facility] OR;  Service: Neurosurgery;  Laterality: Right;   LUMBAR LAMINECTOMY/DECOMPRESSION MICRODISCECTOMY Right 10/19/2017   Procedure: MICRODISCECTOMY LUMBAR FIVE- SACRAL 1 ONE ;  Surgeon: Lisbeth Renshaw, MD;  Location: [facility] OR;  Service: Neurosurgery;  Laterality: Right;   PERIPHERAL VASCULAR BALLOON ANGIOPLASTY Left 05/19/2021   Procedure: PERIPHERAL VASCULAR  BALLOON ANGIOPLASTY;  Surgeon: Cephus Shelling, MD;  Location: [facility] INVASIVE CV LAB;  Service: Cardiovascular;  Laterality: Left;   PERIPHERAL VASCULAR INTERVENTION Left 01/10/2021   Procedure: PERIPHERAL VASCULAR INTERVENTION;  Surgeon: Maeola Harman, MD;  Location: [facility] INVASIVE CV LAB;  Service: Cardiovascular;  Laterality: Left;   POLYPECTOMY  01/17/2022   Procedure: POLYPECTOMY;  Surgeon: Jeani Hawking, MD;  Location: [facility];  Service: Gastroenterology;;   THROMBECTOMY W/ EMBOLECTOMY Left 11/09/2020   Procedure: THROMBECTOMY OF LEFT ARTERIOVENOUS FISTULA;  Surgeon: Chuck Hint, MD;  Location: [facility] OR;  Service: Vascular;  Laterality: Left;   TONSILLECTOMY AND ADENOIDECTOMY     WISDOM TOOTH EXTRACTION     WOUND DEBRIDEMENT Right 05/13/2019   Procedure: DEBRIDEMENT WOUND;  Surgeon: Park Liter, DPM;  Location: [facility] [place];  Service: Podiatry;  Laterality: Right;   Social History   Occupational History   Occupation: retired  Tobacco Use   Smoking status: Former    Packs/day: 1.00    Years: 2.00    Additional pack years: 0.00    Total pack years: 2.00    Types: Cigarettes    Quit date: 1978    Years since quitting: 46.4    Passive exposure: Never   Smokeless tobacco: Never  Vaping Use   Vaping Use: Never used  Substance and Sexual Activity   Alcohol use: Not Currently    Alcohol/week: 0.0 standard drinks of alcohol    Comment: h/o social drinking   Drug use: Not Currently    Types: Marijuana    Comment: quit 1986   Sexual activity: Yes    Birth control/protection: None

## 2022-08-29 ENCOUNTER — Ambulatory Visit (INDEPENDENT_AMBULATORY_CARE_PROVIDER_SITE_OTHER): Payer: Medicare Other | Admitting: Podiatry

## 2022-08-29 ENCOUNTER — Encounter: Payer: Self-pay | Admitting: Podiatry

## 2022-08-29 DIAGNOSIS — B351 Tinea unguium: Secondary | ICD-10-CM

## 2022-08-29 DIAGNOSIS — N186 End stage renal disease: Secondary | ICD-10-CM | POA: Diagnosis not present

## 2022-08-29 DIAGNOSIS — Z89431 Acquired absence of right foot: Secondary | ICD-10-CM

## 2022-08-29 DIAGNOSIS — E1151 Type 2 diabetes mellitus with diabetic peripheral angiopathy without gangrene: Secondary | ICD-10-CM

## 2022-08-29 DIAGNOSIS — L84 Corns and callosities: Secondary | ICD-10-CM

## 2022-09-03 NOTE — Progress Notes (Signed)
ANNUAL DIABETIC FOOT EXAM  Subjective: Jon Anderson presents today annual diabetic foot exam.  Chief Complaint  Patient presents with   Nail Problem    [facility]-did not check today A1C-7.0 PCP-[place] PCP VST-05/2022    Patient confirms h/o diabetes.  Patient has h/o amputation(s): Transmetatarsal amputation right foot.  Patient denies any numbness, tingling, burning, or pins/needle sensation in feet.  Patient endorses symptoms of foot numbness.   Patient endorses symptoms of foot tingling.  Patient endorses symptoms of burning in feet.  Patient endorses symptoms of pins/needles sensation in feet.  Patient has been diagnosed with neuropathy.  Risk factors:  h/o cardiac arrest, diabetes, diabetic neuropathy, history of foot/leg ulcer, history of amputation TMA right foot, PAD, HTN, CAD, hyperlipidemia, ESRD on hemodialysis, h/o tobacco use in remission.  Ralene Ok, MD is patient's PCP.  Past Medical History:  Diagnosis Date   Anemia of chronic disease    Arthritis    CAD (coronary artery disease)    Carotid stenosis    a. <50% RICA, >70% LICA by duplex 07/2015   Chronic chest pain    occ   Constipation    chronic   Dyspnea    occasional with extertion   ESRD (end stage renal disease) on dialysis (HCC)    M-W-F- [name]   GERD (gastroesophageal reflux disease)    History of hiatal hernia    History of kidney stones    Passed   HNP (herniated nucleus pulposus), lumbar    HTN (hypertension)    echo 3/10: EF 60%, LAE   Hyperlipidemia    Mitral stenosis    mild MS 11/15/21   Nephrolithiasis    "passed them all"   PEA (Pulseless electrical activity) (HCC) 11/15/2021   PEA arrest felt likely related to contrast anaphylaxis despite premedication   Peripheral arterial disease (HCC)    a. s/p PTCA  right    Pneumonia yrs ago   Restless legs    Sciatic leg pain    Sleep apnea    does not use cpap   Snores    a. presumed OSA, pt has refused sleep eval  in past.   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) 12/10/2019   Type 2 diabetes mellitus with diabetic chronic kidney disease (HCC) 12/03/2019   Right below-knee amputee (HCC) 11/25/2019   S/P transmetatarsal amputation of foot, right (HCC) 11/25/2019   Gangrene of right foot (HCC) 11/19/2019   History of partial ray amputation of fifth toe of right foot (HCC) 09/09/2019   Ischemic ulcer diabetic foot (HCC) 09/09/2019   Subacute osteomyelitis, right ankle and foot (HCC)    Diabetic foot infection (HCC) 07/24/2019   Chronic diastolic CHF (congestive heart failure) (HCC) 07/24/2019   Diabetic ulcer of right midfoot associated with diabetes mellitus due to underlying condition, with fat layer exposed (HCC)    Gastroenteritis 04/24/2019   Presence of aortocoronary bypass graft 04/11/2019   Long term (current) use of aspirin 04/11/2019   Dependence on renal dialysis (HCC) 04/11/2019   Other constipation 04/11/2019   Allergy, unspecified, initial encounter 01/27/2019   DM neuropathy  with neurologic complication (HCC) 02/28/2018   GERD (gastroesophageal reflux disease) 02/28/2018   Nephrolithiasis 02/28/2018   Sciatic leg pain 02/28/2018   Snores 02/28/2018   Orthopnea 02/23/2018   Elevated troponin 02/23/2018   HNP (herniated nucleus pulposus), lumbar 07/03/2017   Encounter for removal of sutures 06/01/2017   Chest pain in adult 04/27/2017   Restless leg syndrome, uncontrolled 04/27/2017   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/[facility] 03/17/2017   Anemia of chronic disease 03/17/2017   ESRD (end stage renal disease) (HCC) 03/17/2017   Chronic chest pain 03/17/2017   Type II  diabetes mellitus (HCC) 03/17/2017   Acute on chronic diastolic heart failure (HCC) 03/17/2017   Acute heart failure (HCC) 03/17/2017   Lumbar radiculopathy 01/16/2017   Pre-transplant evaluation for kidney transplant 06/15/2016   Pain, unspecified 04/17/2016   Nocturia 12/17/2015   Chest pain, non-cardiac 10/09/2015   Hypercalcemia 05/10/2015   Other fluid overload 02/24/2015   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) 10/03/2014   Fatty (change of) liver, not elsewhere classified 07/02/2014   Aftercare including intermittent dialysis (HCC) 06/24/2014   Diarrhea, unspecified 06/24/2014   Fever, unspecified 06/24/2014   Iron deficiency anemia, unspecified 06/24/2014   Other specified coagulation defects (HCC) 06/24/2014   Pruritus, unspecified 06/24/2014   Secondary hyperparathyroidism of renal origin (HCC) 06/24/2014   Type 2 diabetes mellitus with diabetic peripheral angiopathy without gangrene (HCC) 06/24/2014   Acute on chronic renal failure (HCC) 06/16/2014   Shoulder pain, left 12/15/2013   Chest pain 12/15/2013   Claudication (HCC) 12/11/2013   PVD (peripheral vascular disease) (HCC) 12/11/2013   Carotid artery disease (HCC) 09/30/2013   Acute chest pain 11/15/2012   Bruit 09/15/2010   CAD (coronary artery disease) nonobstructive per cath 2012    Hyperlipidemia LDL goal <70 07/22/2009   Essential hypertension 07/22/2009   Past Surgical History:  Procedure Laterality Date   A/V FISTULAGRAM Left 01/10/2021   Procedure: A/V FISTULAGRAM;  Surgeon: Maeola Harman, MD;  Location: [facility] INVASIVE CV LAB;  Service: Cardiovascular;  Laterality: Left;   A/V FISTULAGRAM Left 05/19/2021   Procedure: A/V Fistulagram;  Surgeon: Cephus Shelling, MD;  Location: [facility] INVASIVE CV LAB;  Service: Cardiovascular;  Laterality: Left;   ABDOMINAL AORTOGRAM W/LOWER EXTREMITY Bilateral 08/21/2019   Procedure: ABDOMINAL AORTOGRAM W/LOWER EXTREMITY;  Surgeon:  Maeola Harman, MD;  Location: [facility] INVASIVE CV LAB;  Service: Cardiovascular;  Laterality: Bilateral;   AMPUTATION Right 07/25/2019   Procedure: RIGHT 5th RAY AMPUTATION;  Surgeon: Nadara Mustard, MD;  Location: [facility] OR;  Service: Orthopedics;  Laterality: Right;   AMPUTATION Right 11/19/2019   Procedure: RIGHT TRANSMETATARSAL AMPUTATION;  Surgeon: Nadara Mustard, MD;  Location: [facility] OR;  Service: Orthopedics;  Laterality: Right;   ANGIOPLASTY / STENTING FEMORAL Left 12/11/2013   dr berry   AV FISTULA PLACEMENT Left 03/19/2014   Procedure: CREATION OF ARTERIOVENOUS (AV) FISTULA  LEFT UPPER ARM;  Surgeon: Pryor Ochoa, MD;  Location: [facility] OR;  Service: Vascular;  Laterality: Left;   AV FISTULA PLACEMENT Left 11/09/2020   Procedure: LEFT ARM ARTERIOVENOUS (AV) FISTULA CREATION;  Surgeon: Maeola Harman, MD;  Location: [facility] OR;  Service: Vascular;  Laterality: Left;   AV FISTULA PLACEMENT Left 02/15/2021   Procedure: INSERTION OF LEFT ARM ARTERIOVENOUS GORE-TEX GRAFT;  Surgeon: Maeola Harman, MD;  Location: [facility] OR;  Service: Vascular;  Laterality: Left;   AV FISTULA PLACEMENT Right 02/16/2022   Procedure: RIGHT ARM BRACHIOCEPAHLIC ARTERIOVENOUS (AV) FISTULA  CREATION;  Surgeon: Maeola Harman, MD;  Location: [facility];  Service: Vascular;  Laterality: Right;  Regional and MAC   BACK SURGERY  01/2018   screws placed    BASCILIC VEIN TRANSPOSITION Left 12/14/2020   Procedure: REVISION OF LEFT ARM SECOND STAGE BASILIC VEIN TRANSPOSITION;  Surgeon: Maeola Harman, MD;  Location: [facility] OR;  Service: Vascular;  Laterality: Left;   BASCILIC VEIN TRANSPOSITION Right 04/18/2022   Procedure: RIGHT BRACHIOBASILIC FISTULA SECOND STAGE TRANSPOSITION;  Surgeon: Maeola Harman, MD;  Location: [facility] OR;  Service: Vascular;  Laterality: Right;   CARDIAC CATHETERIZATION  2001 and 2010    COLONOSCOPY W/ BIOPSIES AND POLYPECTOMY     COLONOSCOPY WITH PROPOFOL N/A 08/01/2016    Procedure: COLONOSCOPY WITH PROPOFOL;  Surgeon: Jeani Hawking, MD;  Location: [facility] ENDOSCOPY;  Service: Endoscopy;  Laterality: N/A;   COLONOSCOPY WITH PROPOFOL N/A 01/17/2022   Procedure: COLONOSCOPY WITH PROPOFOL;  Surgeon: Jeani Hawking, MD;  Location: [facility] ENDOSCOPY;  Service: Gastroenterology;  Laterality: N/A;   CORONARY ARTERY BYPASS GRAFT  2019   [facility] x 1 bypass   ESOPHAGOGASTRODUODENOSCOPY (EGD) WITH PROPOFOL N/A 08/01/2016   Procedure: ESOPHAGOGASTRODUODENOSCOPY (EGD) WITH PROPOFOL;  Surgeon: Jeani Hawking, MD;  Location: [facility] ENDOSCOPY;  Service: Endoscopy;  Laterality: N/A;   FISTULA SUPERFICIALIZATION Left 02/10/2020   Procedure: 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;  Surgeon: Larina Earthly, MD;  Location: [facility] OR;  Service: Vascular;  Laterality: Left;   FISTULA SUPERFICIALIZATION Left 03/23/2020   Procedure: LEFT UPPER EXTREMITY ARTERIOVENOUS FISTULA PLICATION;  Surgeon: Larina Earthly, MD;  Location: [facility] OR;  Service: Vascular;  Laterality: Left;   FOOT FRACTURE SURGERY Right    ligament repair   FRACTURE SURGERY     left forearm   GRAFT APPLICATION Right 05/13/2019   Procedure: FAT GRAFT APPLICATION;  Surgeon: Park Liter, DPM;  Location: [facility];  Service: Podiatry;  Laterality: Right;   HEMATOMA EVACUATION Left 11/09/2020   Procedure: EVACUATION HEMATOMA LEFT ARM;  Surgeon: Chuck Hint, MD;  Location: [facility] OR;  Service: Vascular;  Laterality: Left;   INGUINAL HERNIA REPAIR Left    IR AV DIALY SHUNT INTRO NEEDLE/INTRACATH INITIAL W/PTA/IMG LEFT  11/15/2021   IR AV DIALY SHUNT INTRO NEEDLE/INTRACATH INITIAL W/PTA/IMG RIGHT Right 08/15/2022   IR FLUORO GUIDE CV LINE RIGHT  01/20/2022   IR FLUORO GUIDE CV LINE RIGHT  05/03/2022   IR REMOVAL TUN CV CATH W/O FL  06/16/2021   IR REMOVAL TUN CV CATH W/O FL  07/06/2022   IR US GUIDE VASC ACCESS LEFT  11/15/2021   IR US GUIDE VASC ACCESS LEFT  11/15/2021   IR US GUIDE VASC ACCESS RIGHT  11/15/2021   IR US  GUIDE VASC ACCESS RIGHT  01/20/2022   IR US GUIDE VASC ACCESS RIGHT  08/15/2022   LEFT HEART CATH AND CORS/GRAFTS ANGIOGRAPHY N/A 04/27/2017   Procedure: LEFT HEART CATH AND CORS/GRAFTS ANGIOGRAPHY;  Surgeon: Marykay Lex, MD;  Location: [facility] INVASIVE CV LAB;  Service: Cardiovascular;  Laterality: N/A;   LEFT HEART CATHETERIZATION WITH CORONARY ANGIOGRAM N/A 06/22/2014   Procedure: LEFT HEART CATHETERIZATION WITH CORONARY ANGIOGRAM;  Surgeon: Lennette Bihari, MD;  Location: [facility] CATH LAB;  Service: Cardiovascular;  Laterality: N/A;   LOWER EXTREMITY ANGIOGRAM Left 12/11/2013   Procedure: LOWER EXTREMITY ANGIOGRAM;  Surgeon: Runell Gess, MD;  Location: [facility] CATH LAB;  Service: Cardiovascular;  Laterality: Left;   LUMBAR LAMINECTOMY/DECOMPRESSION MICRODISCECTOMY Right 07/03/2017   Procedure:  MICRODISCECTOMY LUMBAR FIVE - SACRAL ONE RIGHT;  Surgeon: Lisbeth Renshaw, MD;  Location: [facility] OR;  Service: Neurosurgery;  Laterality: Right;   LUMBAR LAMINECTOMY/DECOMPRESSION MICRODISCECTOMY Right 10/19/2017   Procedure: MICRODISCECTOMY LUMBAR FIVE- SACRAL 1 ONE ;  Surgeon: Lisbeth Renshaw, MD;  Location: [facility] OR;  Service: Neurosurgery;  Laterality: Right;   PERIPHERAL VASCULAR BALLOON ANGIOPLASTY Left 05/19/2021   Procedure: PERIPHERAL VASCULAR BALLOON ANGIOPLASTY;  Surgeon: Cephus Shelling, MD;  Location: [facility] INVASIVE CV LAB;  Service: Cardiovascular;  Laterality: Left;   PERIPHERAL VASCULAR INTERVENTION Left 01/10/2021   Procedure: PERIPHERAL VASCULAR INTERVENTION;  Surgeon: Maeola Harman, MD;  Location: [facility] INVASIVE CV LAB;  Service: Cardiovascular;  Laterality: Left;   POLYPECTOMY  01/17/2022   Procedure: POLYPECTOMY;  Surgeon: Jeani Hawking, MD;  Location: [facility];  Service: Gastroenterology;;   THROMBECTOMY W/ EMBOLECTOMY Left 11/09/2020   Procedure: THROMBECTOMY OF LEFT ARTERIOVENOUS FISTULA;  Surgeon: Chuck Hint, MD;  Location: [facility] OR;  Service: Vascular;  Laterality: Left;    TONSILLECTOMY AND ADENOIDECTOMY     WISDOM TOOTH EXTRACTION     WOUND DEBRIDEMENT Right 05/13/2019   Procedure: DEBRIDEMENT WOUND;  Surgeon: Park Liter, DPM;  Location: [facility];  Service: Podiatry;  Laterality: Right;   Current Outpatient Medications on File Prior to Visit  Medication Sig Dispense Refill   acetaminophen (TYLENOL) 500 MG tablet Take 1,000 mg by mouth every 6 (six) hours as needed for moderate pain.     aspirin EC 81 MG tablet Take 1 tablet (81 mg total) by mouth daily. (Patient taking differently: Take 81 mg by mouth at bedtime.)     atorvastatin (LIPITOR) 40 MG tablet Take 1 tablet (40 mg total) by mouth daily at 6 PM. (Patient taking differently: Take 40 mg by mouth at bedtime.) 30 tablet 5   CALCITRIOL PO Take 1 tablet by mouth every Monday, Wednesday, and Friday.     calcium acetate (PHOSLO) 667 MG capsule Take 2 capsules (1,334 mg total) by mouth 2 (two) times daily with a meal. 180 capsule 0   clopidogrel (PLAVIX) 75 MG tablet Take 75 mg by mouth in the morning.     gabapentin (NEURONTIN) 100 MG capsule Take 100-200 mg by mouth See admin instructions. 100 mg in the morning, 200 mg in the evening     HYDROcodone-acetaminophen (NORCO/VICODIN) 5-325 MG tablet Take 1 tablet by mouth every 6 (six) hours as needed for moderate pain. 12 tablet 0   lidocaine (LIDODERM) 5 % Place 1 patch onto the skin daily. Remove & Discard patch within 12 hours or as directed by MD 30 patch 0   loperamide (IMODIUM) 2 MG capsule Take 2 mg by mouth as needed for diarrhea or loose stools.     midodrine (PROAMATINE) 10 MG tablet Take 10 mg by mouth in the morning and at bedtime.     multivitamin (RENA-VIT) TABS tablet Take 1 tablet by mouth at bedtime. 30 tablet 0   ONETOUCH ULTRA test strip See admin instructions.     oxyCODONE-acetaminophen (PERCOCET/ROXICET) 5-325 MG tablet Take 1 tablet by mouth every 6 (six) hours as needed for severe pain. 10 tablet 0   pantoprazole  (PROTONIX) 40 MG tablet Take 40 mg by mouth daily before breakfast.     predniSONE (DELTASONE) 10 MG tablet Take 1 tablet (10 mg total) by mouth in the morning, at noon, and at bedtime. 10 tablet 0   tiZANidine (ZANAFLEX) 4 MG tablet Take 1 tablet (4 mg total) by  mouth every 8 (eight) hours as needed for muscle spasms. 30 tablet 0   No current facility-administered medications on file prior to visit.    Allergies  Allergen Reactions   Contrast Media [Iodinated Contrast Media] Anaphylaxis    Cardiac arrest, Throat swelling, hives, SOB   Kiwi Extract Itching, Swelling and Other (See Comments)    Lips and face swell- breathing not affected   Flexeril [Cyclobenzaprine]     Hands become flimsy, can not hold things   Tape Other (See Comments)    "Plastic" tape causes blisters!!   Social History   Occupational History   Occupation: retired  Tobacco Use   Smoking status: Former    Packs/day: 1.00    Years: 2.00    Additional pack years: 0.00    Total pack years: 2.00    Types: Cigarettes    Quit date: 1978    Years since quitting: 46.4    Passive exposure: Never   Smokeless tobacco: Never  Vaping Use   Vaping Use: Never used  Substance and Sexual Activity   Alcohol use: Not Currently    Alcohol/week: 0.0 standard drinks of alcohol    Comment: h/o social drinking   Drug use: Not Currently    Types: Marijuana    Comment: quit 1986   Sexual activity: Yes    Birth control/protection: None   Family History  Problem Relation Age of Onset   Heart attack Sister        died @ 64   Cancer Mother        died @ 47; unknown type   Diabetes Brother        deceased   Cirrhosis Father        alcohol related   Diabetes Father    Esophageal cancer Neg Hx    Colon cancer Neg Hx    Pancreatic cancer Neg Hx    Stomach cancer Neg Hx    Immunization History  Administered Date(s) Administered   Hepatitis B, ADULT 08/22/2014, 09/19/2014, 11/23/2014, 03/26/2015   Influenza-Unspecified  12/09/2013   Pfizer Covid-19 Vaccine Bivalent Booster 25yrs & up 02/10/2021   Pneumococcal Polysaccharide-23 12/01/2015     Review of Systems: Negative except as noted in the HPI.   Objective: There were no vitals filed for this visit.  Jon Anderson is a pleasant 72 y.o. male in NAD. AAO X 3.  Vascular Examination: CFT <4 seconds left foot. DP pulses diminished b/l. PT pulses diminished b/l. Digital hair absent. Skin temperature gradient warm to warm b/l. No ischemia or gangrene. No cyanosis or clubbing noted b/l. Trace edema noted left foot.   Neurological Examination: Protective sensation diminished with 10g monofilament b/l.  Dermatological Examination: Pedal skin thin, shiny and atrophic b/l. No open wounds. No interdigital macerations.   Toenails 1-5 left foot thick, discolored, elongated with subungual debris and pain on dorsal palpation.   Hyperkeratotic lesion(s) distal stump right foot incision.  No erythema, no edema, no drainage, no fluctuance. Preulcerative lesion noted submet head 5 left foot. There is visible subdermal hemorrhage. There is no surrounding erythema, no edema, no drainage, no odor, no fluctuance.  Musculoskeletal Examination: Muscle strength 5/5 to LLE. Lower extremity amputation(s): Transmetatarsal amputation right foot. Wearing diabetic shoes today. Utilizes cane for ambulation assistance.  Radiographs: None  Last A1c:      Latest Ref Rng & Units 11/15/2021    2:30 PM  Hemoglobin A1C  Hemoglobin-A1c 4.8 - 5.6 % 7.6     Lab  Results  Component Value Date   HGBA1C 7.6 (H) 11/15/2021   ADA Risk Categorization: High Risk  Patient has one or more of the following: Loss of protective sensation Absent pedal pulses Severe Foot deformity History of foot ulcer  Assessment: 1. Onychomycosis   2. Pre-ulcerative calluses   3. S/P transmetatarsal amputation of foot, right (HCC)   4. Type II diabetes mellitus with peripheral circulatory disorder  (HCC)   5. ESRD (end stage renal disease) (HCC)      Plan: Orders Placed This Encounter  Procedures   For Home Use Only DME Diabetic Shoe    Dispense one pair extra depth shoes. Dispense toe filler right foot. Custom molded insole left foot x 3. Offload callus submet head 5 left foot.   FOR HOME USE ONLY DME DIABETIC SHOE  -Patient was evaluated and treated. All patient's and/or POA's questions/concerns answered on today's visit. -Diabetic foot examination performed today. -Continue diabetic shoes daily. -Patient to continue soft, supportive shoe gear daily. Start procedure for diabetic shoes. Patient qualifies based on diagnoses. -Toenails were debrided in length and girth 1-5 left foot with sterile nail nippers and dremel without iatrogenic bleeding.  -Callus(es) distal stump right foot pared utilizing sharp debridement with sterile blade without complication or incident. Total number debrided =1. -Preulcerative callus submet head 5 left foot gently filed with bur without incident. -Patient/POA to call should there be question/concern in the interim. Return in about 9 weeks (around 10/31/2022).  Freddie Breech, DPM

## 2022-09-18 ENCOUNTER — Ambulatory Visit (INDEPENDENT_AMBULATORY_CARE_PROVIDER_SITE_OTHER): Payer: Medicare Other | Admitting: Podiatry

## 2022-09-18 DIAGNOSIS — N186 End stage renal disease: Secondary | ICD-10-CM

## 2022-09-18 DIAGNOSIS — Z992 Dependence on renal dialysis: Secondary | ICD-10-CM

## 2022-09-18 DIAGNOSIS — L84 Corns and callosities: Secondary | ICD-10-CM

## 2022-09-18 DIAGNOSIS — E1151 Type 2 diabetes mellitus with diabetic peripheral angiopathy without gangrene: Secondary | ICD-10-CM

## 2022-09-18 DIAGNOSIS — E0822 Diabetes mellitus due to underlying condition with diabetic chronic kidney disease: Secondary | ICD-10-CM

## 2022-09-18 DIAGNOSIS — Z89431 Acquired absence of right foot: Secondary | ICD-10-CM

## 2022-09-18 DIAGNOSIS — M2042 Other hammer toe(s) (acquired), left foot: Secondary | ICD-10-CM

## 2022-09-18 NOTE — Progress Notes (Signed)
Patient presents to the office today for diabetic shoe and insole measuring.  Patient was measured with brannock device to determine size and width for 1 pair of extra depth shoes and foam casted for 3 pair of insoles.   ABN signed.   Documentation of medical necessity will be sent to patient's treating diabetic doctor to verify and sign.   Patient's diabetic provider: Ralene Ok  Shoes and insoles will be ordered at that time and patient will be notified for an appointment for fitting when they arrive.   Brannock measurement: 9 Wide - left, 4.5 W - right  Patient shoe selection-   1st   Shoe choice:   B3000M - A[ex Oxford Strap  2nd  Shoe choice:   585 - [employer] [place]  Shoe size ordered: 9 Wide

## 2022-11-06 ENCOUNTER — Encounter: Payer: Self-pay | Admitting: Podiatry

## 2022-11-06 ENCOUNTER — Ambulatory Visit: Payer: Medicare Other | Admitting: Podiatry

## 2022-11-06 DIAGNOSIS — M79675 Pain in left toe(s): Secondary | ICD-10-CM

## 2022-11-06 DIAGNOSIS — L84 Corns and callosities: Secondary | ICD-10-CM | POA: Diagnosis not present

## 2022-11-06 DIAGNOSIS — M79674 Pain in right toe(s): Secondary | ICD-10-CM

## 2022-11-06 DIAGNOSIS — E1151 Type 2 diabetes mellitus with diabetic peripheral angiopathy without gangrene: Secondary | ICD-10-CM | POA: Diagnosis not present

## 2022-11-06 DIAGNOSIS — B351 Tinea unguium: Secondary | ICD-10-CM | POA: Diagnosis not present

## 2022-11-06 DIAGNOSIS — Z89431 Acquired absence of right foot: Secondary | ICD-10-CM | POA: Diagnosis not present

## 2022-11-06 NOTE — Progress Notes (Signed)
Subjective:  Patient ID: Jon Anderson, male    DOB: 08-17-1950,   MRN: [id number]  No chief complaint on file.   72 y.o. male presents for concern of thickened elongated and painful nails that are difficult to trim. Requesting to have them trimmed today. Relates burning and tingling in their feet. Patient is diabetic and last A1c was  Lab Results  Component Value Date   HGBA1C 7.6 (H) 11/15/2021   .   PCP:  Ralene Ok, MD    . Denies any other pedal complaints. Denies n/v/f/c.   Past Medical History:  Diagnosis Date   Anemia of chronic disease    Arthritis    CAD (coronary artery disease)    Carotid stenosis    a. <50% RICA, >70% LICA by duplex 07/2015   Chronic chest pain    occ   Constipation    chronic   Dyspnea    occasional with extertion   ESRD (end stage renal disease) on dialysis (HCC)    M-W-F- [name]   GERD (gastroesophageal reflux disease)    History of hiatal hernia    History of kidney stones    Passed   HNP (herniated nucleus pulposus), lumbar    HTN (hypertension)    echo 3/10: EF 60%, LAE   Hyperlipidemia    Mitral stenosis    mild MS 11/15/21   Nephrolithiasis    "passed them all"   PEA (Pulseless electrical activity) (HCC) 11/15/2021   PEA arrest felt likely related to contrast anaphylaxis despite premedication   Peripheral arterial disease (HCC)    a. s/p PTCA  right    Pneumonia yrs ago   Restless legs    Sciatic leg pain    Sleep apnea    does not use cpap   Snores    a. presumed OSA, pt has refused sleep eval in past.   Type II diabetes mellitus (HCC)    no longer on medications, checks blood glucose at home   Urinary frequency    Uses wheelchair    Walker as ambulation aid     Objective:  Physical Exam: Vascular: DP/PT pulses 2/4 bilateral. CFT <3 seconds. Absent hair growth on digits. Edema noted to bilateral lower extremities. Xerosis noted bilaterally.  Skin. No lacerations or abrasions bilateral feet. Nails 1-5 left are  thickened discolored and elongated with subungual debris. Hyperkeraottic lesions sub fifth metatarsal head on left and sub fourht metatarsal  on right  Musculoskeletal: MMT 5/5 bilateral lower extremities in DF, PF, Inversion and Eversion. Deceased ROM in DF of ankle joint. TMA on the right foot.  Neurological: Sensation intact to light touch. Protective sensation diminished bilateral.     Assessment:   1. Pre-ulcerative calluses   2. S/P transmetatarsal amputation of foot, right (HCC)   3. Type II diabetes mellitus with peripheral circulatory disorder (HCC)   4. Pain due to onychomycosis of toenails of both feet      Plan:  Patient was evaluated and treated and all questions answered. -Discussed and educated patient on diabetic foot care, especially with  regards to the vascular, neurological and musculoskeletal systems.  -Stressed the importance of good glycemic control and the detriment of not  controlling glucose levels in relation to the foot. -Discussed supportive shoes at all times and checking feet regularly.  -Mechanically debrided all nails 1-5 bilateral using sterile nail nipper and filed with dremel without incident  -Hyperkeratotic tissue debrided without incident with chisel x 2  -Answered all  patient questions -Patient to return  in 3 months for at risk foot care -Patient advised to call the office if any problems or questions arise in the meantime.   Louann Sjogren, DPM

## 2022-12-28 ENCOUNTER — Ambulatory Visit (HOSPITAL_COMMUNITY)
Admission: RE | Admit: 2022-12-28 | Discharge: 2022-12-28 | Disposition: A | Discharge status: Home / Self Care | Payer: Medicare Other | Source: Ambulatory Visit | Attending: Cardiovascular Disease | Admitting: Cardiovascular Disease

## 2022-12-28 DIAGNOSIS — I6522 Occlusion and stenosis of left carotid artery: Secondary | ICD-10-CM | POA: Diagnosis present

## 2023-01-01 ENCOUNTER — Other Ambulatory Visit: Payer: Self-pay | Admitting: *Deleted

## 2023-01-01 DIAGNOSIS — I6522 Occlusion and stenosis of left carotid artery: Secondary | ICD-10-CM

## 2023-01-09 ENCOUNTER — Encounter: Payer: Self-pay | Admitting: Podiatry

## 2023-01-09 ENCOUNTER — Ambulatory Visit (INDEPENDENT_AMBULATORY_CARE_PROVIDER_SITE_OTHER): Payer: Medicare Other | Admitting: Podiatry

## 2023-01-09 DIAGNOSIS — E1151 Type 2 diabetes mellitus with diabetic peripheral angiopathy without gangrene: Secondary | ICD-10-CM

## 2023-01-09 DIAGNOSIS — Z89431 Acquired absence of right foot: Secondary | ICD-10-CM | POA: Diagnosis not present

## 2023-01-09 DIAGNOSIS — M79674 Pain in right toe(s): Secondary | ICD-10-CM

## 2023-01-09 DIAGNOSIS — B351 Tinea unguium: Secondary | ICD-10-CM

## 2023-01-09 DIAGNOSIS — L84 Corns and callosities: Secondary | ICD-10-CM | POA: Diagnosis not present

## 2023-01-09 DIAGNOSIS — M79675 Pain in left toe(s): Secondary | ICD-10-CM | POA: Diagnosis not present

## 2023-01-09 DIAGNOSIS — M216X1 Other acquired deformities of right foot: Secondary | ICD-10-CM

## 2023-01-13 NOTE — Progress Notes (Signed)
Subjective:  Patient ID: Nat Math, male    DOB: 10-Oct-1950,  MRN: [id number]  VIDEL NAGER presents to clinic today for at risk foot care. Patient has h/o NIDDM, neuropathy and PAD with amputation of Transmetatarsal amputation right foot and preulcerative lesion(s) of both feet and painful mycotic toenails that limit ambulation. Painful toenails interfere with ambulation. Aggravating factors include wearing enclosed shoe gear. Pain is relieved with periodic professional debridement. Painful preulcerative lesion(s) is/are aggravated when weightbearing with and without shoegear. Pain is relieved with periodic professional debridement.  Chief Complaint  Patient presents with   Diabetes    DFC BS- 119 A1C- 7.5 PCPV-11/2022   New problem(s): Patient complains of painful area submet 5 right foot.  PCP is Ralene Ok, MD.  Allergies  Allergen Reactions   Contrast Media [Iodinated Contrast Media] Anaphylaxis    Cardiac arrest, Throat swelling, hives, SOB   Kiwi Extract Itching, Swelling and Other (See Comments)    Lips and face swell- breathing not affected   Flexeril [Cyclobenzaprine]     Hands become flimsy, can not hold things   Tape Other (See Comments)    "Plastic" tape causes blisters!!    Review of Systems: Negative except as noted in the HPI.  Objective:  There were no vitals filed for this visit. TIRTH MASK is a pleasant 72 y.o. male WD, WN in NAD. AAO x 3.  Vascular Examination: CFT <4 seconds left foot. DP pulses diminished b/l. PT pulses diminished b/l. Digital hair absent. Skin temperature gradient warm to warm b/l. No ischemia or gangrene. No cyanosis or clubbing noted b/l. Trace edema noted left foot.   Neurological Examination: Protective sensation diminished with 10g monofilament b/l.  Dermatological Examination: Pedal skin thin, shiny and atrophic b/l. No open wounds. No interdigital macerations.   Toenails 1-5 left foot thick, discolored,  elongated with subungual debris and pain on dorsal palpation.   Hyperkeratotic lesion(s) distal stump right foot incision.  No erythema, no edema, no drainage, no fluctuance.   Preulcerative lesion noted submet head 5 left foot and new lesion submet head 5 right foot. There is visible subdermal hemorrhage of preulcerative lesion of left foot There is no surrounding erythema, no edema, no drainage, no odor, no fluctuance.  Musculoskeletal Examination: Muscle strength 5/5 to LLE. Lower extremity amputation(s): Transmetatarsal amputation right foot. Wearing diabetic shoes today. Utilizes cane for ambulation assistance.  Radiographs: None  Assessment/Plan: 1. Pain due to onychomycosis of toenails of both feet   2. Pre-ulcerative calluses   3. S/P transmetatarsal amputation of foot, right (HCC)   4. Acquired inversion deformity of right foot   5. Type II diabetes mellitus with peripheral circulatory disorder (HCC)    -Consent given for treatment as described below: -Examined patient. -Patient has developed new lesion plantar aspect of left foot which may be due to inversion of TMA stump. Order placed for: Dispense one pair extra depth shoes and 3 custom insoles for left foot. Offload plantar preulcerative lesions left foot. Needs filler for right foot and offload plantarlateral lesion of right foot. -Continue diabetic shoes daily. -Toenails were debrided in length and girth 1-5 left foot with sterile nail nippers and dremel without iatrogenic bleeding.  -Callus(es) distal TMA stump right foot pared utilizing sterile scalpel blade without complication or incident. Total number debrided =1. -Preulcerative lesion pared submet head 5 b/l utilizing gentle filing and  sterile scalpel blade. Total number pared=2. -Patient/POA to call should there be question/concern in the interim.  Return in about 9 weeks (around 03/13/2023).  Freddie Breech, DPM

## 2023-01-26 ENCOUNTER — Other Ambulatory Visit (HOSPITAL_COMMUNITY): Payer: Self-pay | Admitting: Nephrology

## 2023-01-26 ENCOUNTER — Emergency Department (HOSPITAL_COMMUNITY): Payer: Medicare Other

## 2023-01-26 ENCOUNTER — Other Ambulatory Visit: Payer: Self-pay

## 2023-01-26 ENCOUNTER — Observation Stay (HOSPITAL_COMMUNITY)
Admission: EM | Admit: 2023-01-26 | Discharge: 2023-01-27 | Disposition: A | Discharge status: Home / Self Care | Payer: Medicare Other | Attending: Internal Medicine | Admitting: Internal Medicine

## 2023-01-26 ENCOUNTER — Ambulatory Visit (HOSPITAL_COMMUNITY)
Admission: RE | Admit: 2023-01-26 | Discharge: 2023-01-26 | Disposition: A | Discharge status: Home / Self Care | Payer: Medicare Other | Source: Ambulatory Visit | Attending: Nephrology | Admitting: Nephrology

## 2023-01-26 ENCOUNTER — Other Ambulatory Visit: Payer: Self-pay | Admitting: Student

## 2023-01-26 DIAGNOSIS — N186 End stage renal disease: Secondary | ICD-10-CM

## 2023-01-26 DIAGNOSIS — E1122 Type 2 diabetes mellitus with diabetic chronic kidney disease: Secondary | ICD-10-CM | POA: Insufficient documentation

## 2023-01-26 DIAGNOSIS — I739 Peripheral vascular disease, unspecified: Secondary | ICD-10-CM

## 2023-01-26 DIAGNOSIS — G9341 Metabolic encephalopathy: Principal | ICD-10-CM

## 2023-01-26 DIAGNOSIS — I120 Hypertensive chronic kidney disease with stage 5 chronic kidney disease or end stage renal disease: Secondary | ICD-10-CM | POA: Insufficient documentation

## 2023-01-26 DIAGNOSIS — N2581 Secondary hyperparathyroidism of renal origin: Secondary | ICD-10-CM | POA: Diagnosis not present

## 2023-01-26 DIAGNOSIS — E119 Type 2 diabetes mellitus without complications: Secondary | ICD-10-CM

## 2023-01-26 DIAGNOSIS — E1151 Type 2 diabetes mellitus with diabetic peripheral angiopathy without gangrene: Secondary | ICD-10-CM | POA: Insufficient documentation

## 2023-01-26 DIAGNOSIS — Z8674 Personal history of sudden cardiac arrest: Secondary | ICD-10-CM | POA: Insufficient documentation

## 2023-01-26 DIAGNOSIS — Z79899 Other long term (current) drug therapy: Secondary | ICD-10-CM | POA: Diagnosis not present

## 2023-01-26 DIAGNOSIS — I959 Hypotension, unspecified: Secondary | ICD-10-CM | POA: Diagnosis not present

## 2023-01-26 DIAGNOSIS — I2510 Atherosclerotic heart disease of native coronary artery without angina pectoris: Secondary | ICD-10-CM | POA: Insufficient documentation

## 2023-01-26 DIAGNOSIS — I5032 Chronic diastolic (congestive) heart failure: Secondary | ICD-10-CM | POA: Diagnosis not present

## 2023-01-26 DIAGNOSIS — I472 Ventricular tachycardia, unspecified: Secondary | ICD-10-CM

## 2023-01-26 DIAGNOSIS — Y841 Kidney dialysis as the cause of abnormal reaction of the patient, or of later complication, without mention of misadventure at the time of the procedure: Secondary | ICD-10-CM | POA: Insufficient documentation

## 2023-01-26 DIAGNOSIS — Z87891 Personal history of nicotine dependence: Secondary | ICD-10-CM | POA: Insufficient documentation

## 2023-01-26 DIAGNOSIS — T82898A Other specified complication of vascular prosthetic devices, implants and grafts, initial encounter: Secondary | ICD-10-CM | POA: Insufficient documentation

## 2023-01-26 DIAGNOSIS — G4733 Obstructive sleep apnea (adult) (pediatric): Secondary | ICD-10-CM | POA: Diagnosis not present

## 2023-01-26 DIAGNOSIS — E785 Hyperlipidemia, unspecified: Secondary | ICD-10-CM | POA: Diagnosis not present

## 2023-01-26 DIAGNOSIS — Z992 Dependence on renal dialysis: Secondary | ICD-10-CM | POA: Insufficient documentation

## 2023-01-26 DIAGNOSIS — Z89421 Acquired absence of other right toe(s): Secondary | ICD-10-CM | POA: Insufficient documentation

## 2023-01-26 DIAGNOSIS — I952 Hypotension due to drugs: Secondary | ICD-10-CM | POA: Diagnosis not present

## 2023-01-26 DIAGNOSIS — D631 Anemia in chronic kidney disease: Secondary | ICD-10-CM | POA: Diagnosis not present

## 2023-01-26 DIAGNOSIS — Z7982 Long term (current) use of aspirin: Secondary | ICD-10-CM | POA: Insufficient documentation

## 2023-01-26 DIAGNOSIS — G9340 Encephalopathy, unspecified: Principal | ICD-10-CM

## 2023-01-26 DIAGNOSIS — I132 Hypertensive heart and chronic kidney disease with heart failure and with stage 5 chronic kidney disease, or end stage renal disease: Secondary | ICD-10-CM | POA: Diagnosis not present

## 2023-01-26 DIAGNOSIS — R55 Syncope and collapse: Secondary | ICD-10-CM | POA: Diagnosis present

## 2023-01-26 HISTORY — PX: IR US GUIDE VASC ACCESS LEFT: IMG2389

## 2023-01-26 HISTORY — PX: IR FLUORO GUIDE CV LINE LEFT: IMG2282

## 2023-01-26 LAB — CBC WITH DIFFERENTIAL/PLATELET
Abs Immature Granulocytes: 0.1 10*3/uL — ABNORMAL HIGH (ref 0.00–0.07)
Basophils Absolute: 0 10*3/uL (ref 0.0–0.1)
Basophils Relative: 0 %
Eosinophils Absolute: 0 10*3/uL (ref 0.0–0.5)
Eosinophils Relative: 0 %
HCT: 28.2 % — ABNORMAL LOW (ref 39.0–52.0)
Hemoglobin: 9.2 g/dL — ABNORMAL LOW (ref 13.0–17.0)
Lymphocytes Relative: 84 %
Lymphs Abs: 5.3 10*3/uL — ABNORMAL HIGH (ref 0.7–4.0)
MCH: 29.3 pg (ref 26.0–34.0)
MCHC: 32.6 g/dL (ref 30.0–36.0)
MCV: 89.8 fL (ref 80.0–100.0)
Monocytes Absolute: 0.1 10*3/uL (ref 0.1–1.0)
Monocytes Relative: 2 %
Myelocytes: 1 %
Neutro Abs: 0.8 10*3/uL — ABNORMAL LOW (ref 1.7–7.7)
Neutrophils Relative %: 13 %
Platelets: 195 10*3/uL (ref 150–400)
RBC: 3.14 MIL/uL — ABNORMAL LOW (ref 4.22–5.81)
RDW: 15.6 % — ABNORMAL HIGH (ref 11.5–15.5)
WBC: 6.3 10*3/uL (ref 4.0–10.5)
nRBC: 0 % (ref 0.0–0.2)
nRBC: 1 /100{WBCs} — ABNORMAL HIGH

## 2023-01-26 LAB — COMPREHENSIVE METABOLIC PANEL
ALT: 15 U/L (ref 0–44)
AST: 12 U/L — ABNORMAL LOW (ref 15–41)
Albumin: 3 g/dL — ABNORMAL LOW (ref 3.5–5.0)
Alkaline Phosphatase: 59 U/L (ref 38–126)
Anion gap: 22 — ABNORMAL HIGH (ref 5–15)
BUN: 91 mg/dL — ABNORMAL HIGH (ref 8–23)
CO2: 21 mmol/L — ABNORMAL LOW (ref 22–32)
Calcium: 9.1 mg/dL (ref 8.9–10.3)
Chloride: 96 mmol/L — ABNORMAL LOW (ref 98–111)
Creatinine, Ser: 16.41 mg/dL — ABNORMAL HIGH (ref 0.61–1.24)
GFR, Estimated: 3 mL/min — ABNORMAL LOW (ref >60)
Glucose, Bld: 110 mg/dL — ABNORMAL HIGH (ref 70–99)
Potassium: 4.3 mmol/L (ref 3.5–5.1)
Sodium: 139 mmol/L (ref 135–145)
Total Bilirubin: 0.8 mg/dL (ref 0.3–1.2)
Total Protein: 5.6 g/dL — ABNORMAL LOW (ref 6.5–8.1)

## 2023-01-26 LAB — GLUCOSE, CAPILLARY: Glucose-Capillary: 82 mg/dL (ref 70–99)

## 2023-01-26 LAB — I-STAT ARTERIAL BLOOD GAS, ED
Acid-base deficit: 4 mmol/L — ABNORMAL HIGH (ref 0.0–2.0)
Bicarbonate: 20.6 mmol/L (ref 20.0–28.0)
Calcium, Ion: 1.03 mmol/L — ABNORMAL LOW (ref 1.15–1.40)
HCT: 26 % — ABNORMAL LOW (ref 39.0–52.0)
Hemoglobin: 8.8 g/dL — ABNORMAL LOW (ref 13.0–17.0)
O2 Saturation: 96 %
Potassium: 3.4 mmol/L — ABNORMAL LOW (ref 3.5–5.1)
Sodium: 134 mmol/L — ABNORMAL LOW (ref 135–145)
TCO2: 22 mmol/L (ref 22–32)
pCO2 arterial: 36.2 mm[Hg] (ref 32–48)
pH, Arterial: 7.364 (ref 7.35–7.45)
pO2, Arterial: 81 mm[Hg] — ABNORMAL LOW (ref 83–108)

## 2023-01-26 LAB — I-STAT CHEM 8, ED
BUN: 96 mg/dL — ABNORMAL HIGH (ref 8–23)
Calcium, Ion: 1.07 mmol/L — ABNORMAL LOW (ref 1.15–1.40)
Chloride: 98 mmol/L (ref 98–111)
Creatinine, Ser: >18 mg/dL — ABNORMAL HIGH (ref 0.61–1.24)
Glucose, Bld: 103 mg/dL — ABNORMAL HIGH (ref 70–99)
HCT: 28 % — ABNORMAL LOW (ref 39.0–52.0)
Hemoglobin: 9.5 g/dL — ABNORMAL LOW (ref 13.0–17.0)
Potassium: 4.2 mmol/L (ref 3.5–5.1)
Sodium: 135 mmol/L (ref 135–145)
TCO2: 23 mmol/L (ref 22–32)

## 2023-01-26 LAB — I-STAT CG4 LACTIC ACID, ED
Lactic Acid, Venous: 2.1 mmol/L (ref 0.5–1.9)
Lactic Acid, Venous: 2 mmol/L (ref 0.5–1.9)

## 2023-01-26 LAB — MAGNESIUM: Magnesium: 1.9 mg/dL (ref 1.7–2.4)

## 2023-01-26 MED ORDER — ENOXAPARIN SODIUM 40 MG/0.4ML IJ SOSY
40.0000 mg | PREFILLED_SYRINGE | INTRAMUSCULAR | Status: DC
  Administered 2023-01-26: 40 mg via SUBCUTANEOUS
  Filled 2023-01-26: qty 0.4

## 2023-01-26 MED ORDER — HEPARIN SODIUM (PORCINE) 1000 UNIT/ML IJ SOLN
INTRAMUSCULAR | Status: AC
  Filled 2023-01-26: qty 10

## 2023-01-26 MED ORDER — NALOXONE HCL 0.4 MG/ML IJ SOLN
0.2000 mg | Freq: Once | INTRAMUSCULAR | Status: AC
  Administered 2023-01-26: 0.2 mg via INTRAVENOUS
  Filled 2023-01-26: qty 1

## 2023-01-26 MED ORDER — CEFAZOLIN SODIUM-DEXTROSE 2-4 GM/100ML-% IV SOLN: 2.0000 g | Freq: Once | INTRAVENOUS | Status: DC

## 2023-01-26 MED ORDER — ACETAMINOPHEN 325 MG PO TABS
650.0000 mg | ORAL_TABLET | Freq: Four times a day (QID) | ORAL | Status: DC | PRN
  Administered 2023-01-26: 650 mg via ORAL
  Filled 2023-01-26: qty 2

## 2023-01-26 MED ORDER — ASPIRIN 81 MG PO TBEC
81.0000 mg | DELAYED_RELEASE_TABLET | Freq: Every day | ORAL | Status: DC
  Administered 2023-01-26: 81 mg via ORAL
  Filled 2023-01-26: qty 1

## 2023-01-26 MED ORDER — PHENYLEPHRINE HCL (PRESSORS) 10 MG/ML IV SOLN
INTRAVENOUS | Status: AC | PRN
  Administered 2023-01-26: 160 ug via INTRAVENOUS

## 2023-01-26 MED ORDER — PANTOPRAZOLE SODIUM 40 MG PO TBEC
40.0000 mg | DELAYED_RELEASE_TABLET | Freq: Every day | ORAL | Status: DC
  Administered 2023-01-27: 40 mg via ORAL
  Filled 2023-01-26: qty 1

## 2023-01-26 MED ORDER — CALCIUM ACETATE (PHOS BINDER) 667 MG PO CAPS
1334.0000 mg | ORAL_CAPSULE | Freq: Two times a day (BID) | ORAL | Status: DC
  Administered 2023-01-27: 1334 mg via ORAL
  Filled 2023-01-26: qty 2

## 2023-01-26 MED ORDER — LIDOCAINE-EPINEPHRINE 1 %-1:100000 IJ SOLN
INTRAMUSCULAR | Status: AC
  Filled 2023-01-26: qty 1

## 2023-01-26 MED ORDER — CLOPIDOGREL BISULFATE 75 MG PO TABS
75.0000 mg | ORAL_TABLET | Freq: Every day | ORAL | Status: DC
  Administered 2023-01-27: 75 mg via ORAL
  Filled 2023-01-26: qty 1

## 2023-01-26 MED ORDER — CEFAZOLIN SODIUM-DEXTROSE 2-4 GM/100ML-% IV SOLN
INTRAVENOUS | Status: AC | PRN
  Administered 2023-01-26: 2 g via INTRAVENOUS

## 2023-01-26 MED ORDER — FENTANYL CITRATE (PF) 100 MCG/2ML IJ SOLN
INTRAMUSCULAR | Status: AC
  Filled 2023-01-26: qty 4

## 2023-01-26 MED ORDER — MIDAZOLAM HCL 2 MG/2ML IJ SOLN
INTRAMUSCULAR | Status: AC | PRN
  Administered 2023-01-26: 1 mg via INTRAVENOUS

## 2023-01-26 MED ORDER — MIDAZOLAM HCL 2 MG/2ML IJ SOLN
INTRAMUSCULAR | Status: AC
  Filled 2023-01-26: qty 4

## 2023-01-26 MED ORDER — ONDANSETRON HCL 4 MG/2ML IJ SOLN
INTRAMUSCULAR | Status: AC
  Filled 2023-01-26: qty 2

## 2023-01-26 MED ORDER — CEFAZOLIN SODIUM-DEXTROSE 2-4 GM/100ML-% IV SOLN
INTRAVENOUS | Status: AC
  Filled 2023-01-26: qty 100

## 2023-01-26 NOTE — Sedation Documentation (Addendum)
Pt was experiencing periodic episodes of non- sustaining V-tach and was asymptomatic. At 1515, pt was in sustained V-tach and became symptomatic; diaphoretic, BP 56/37 HR 147 O2 sat 88  A non-rebreather was applied w/8 L of O2. Rapid Response called and patient transferred to ED. Report given to Rubin Payor, MD.  Vital signs per flowsheet.

## 2023-01-26 NOTE — Assessment & Plan Note (Signed)
Continue aspirin and plavix. 

## 2023-01-26 NOTE — ED Notes (Signed)
Wife  Corrie Dandy would like an update [phone]

## 2023-01-26 NOTE — Assessment & Plan Note (Addendum)
-  last dialysis on Monday (today is Friday). Creatinine up to 16 from 7 -nephrology Dr. Ronalee Belts recommends dialysis tomorrow -potassium is normal  -POD 0 of  left chest tunneled dialysis catheter placement with IR

## 2023-01-26 NOTE — H&P (Signed)
History and Physical    Patient: Jon Anderson XFG:[id number] DOB: [birth year] DOA: 01/26/2023 DOS: the patient was seen and examined on 01/26/2023 PCP: Ralene Ok, MD  Patient coming from: Home  Chief Complaint:  Chief Complaint  Patient presents with   Unresponsive   HPI: Jon Anderson is a 72 y.o. male with medical history significant of ESRD, chronic diastolic heart failure, hypertension, CAD, PVD, type 2 diabetes, history of cardiac arrest likely seconary to contrast-related anaphylaxis who presents with unresponsiveness.   Patient underwent image-guided tunneled dialysis catheter placement with IR outpatient today and brought into ED from short stay. Had received 1mg  of Versed and became more unresponsive and hypotensive to SPB 60s. Also reportedly had short run of nonsustained V. Tach prior to procedure. His last dialysis session was Monday with today being Friday.   He was empirically give Calcium in ED due concerns for hyperkalemia with ESRD. However EKG was reassuring without T wave changes. Narcan was given without relief. Phenylephrine push dose given for hypotension with steady improvement in blood pressure and mental status.   CT head was negative for acute process. CXR shows early pulmonary edema.   No leukocytosis. Hgb appears diluted at 9.2 with baseline of 11-12.  Lactate of 2.   BMP with potassium of 4.2 and creatinine of 16.40 from prior of 7. CBG of 110.   EDP discussed with nephrology Dr. Ronalee Belts who recommends dialysis tomorrow since his potassium is normal and wants him to be more stabilized overnight.   Hospitalist then consulted for admission.   On evaluation at admission, pt was alert and oriented although he has no recollect of recent event. He reports feeling ill on Wed with a headache so skipped his dialysis day but felt fine today prior to procedure. He remembers receiving IV Versed and felt abdominal pain immediate after and cannot recall  anything after that. States the sensation was similar to when he had PEA arrest from contrast in the past.    Review of Systems: As mentioned in the history of present illness. All other systems reviewed and are negative. Past Medical History:  Diagnosis Date   Anemia of chronic disease    Arthritis    CAD (coronary artery disease)    Carotid stenosis    a. <50% RICA, >70% LICA by duplex 07/2015   Chronic chest pain    occ   Constipation    chronic   Dyspnea    occasional with extertion   ESRD (end stage renal disease) on dialysis (HCC)    M-W-F- [name]   GERD (gastroesophageal reflux disease)    History of hiatal hernia    History of kidney stones    Passed   HNP (herniated nucleus pulposus), lumbar    HTN (hypertension)    echo 3/10: EF 60%, LAE   Hyperlipidemia    Mitral stenosis    mild MS 11/15/21   Nephrolithiasis    "passed them all"   PEA (Pulseless electrical activity) (HCC) 11/15/2021   PEA arrest felt likely related to contrast anaphylaxis despite premedication   Peripheral arterial disease (HCC)    a. s/p PTCA  right    Pneumonia yrs ago   Restless legs    Sciatic leg pain    Sleep apnea    does not use cpap   Snores    a. presumed OSA, pt has refused sleep eval in past.   Type II diabetes mellitus (HCC)    no longer on medications,  checks blood glucose at home   Urinary frequency    Uses wheelchair    Walker as ambulation aid    Past Surgical History:  Procedure Laterality Date   A/V FISTULAGRAM Left 01/10/2021   Procedure: A/V FISTULAGRAM;  Surgeon: Maeola Harman, MD;  Location: [facility] INVASIVE CV LAB;  Service: Cardiovascular;  Laterality: Left;   A/V FISTULAGRAM Left 05/19/2021   Procedure: A/V Fistulagram;  Surgeon: Cephus Shelling, MD;  Location: [facility] INVASIVE CV LAB;  Service: Cardiovascular;  Laterality: Left;   ABDOMINAL AORTOGRAM W/LOWER EXTREMITY Bilateral 08/21/2019   Procedure: ABDOMINAL AORTOGRAM W/LOWER EXTREMITY;  Surgeon: Maeola Harman, MD;  Location: [facility] INVASIVE CV LAB;  Service: Cardiovascular;  Laterality: Bilateral;   AMPUTATION Right 07/25/2019   Procedure: RIGHT 5th RAY AMPUTATION;  Surgeon: Nadara Mustard, MD;  Location: [facility] OR;  Service: Orthopedics;  Laterality: Right;   AMPUTATION Right 11/19/2019   Procedure: RIGHT TRANSMETATARSAL AMPUTATION;  Surgeon: Nadara Mustard, MD;  Location: [facility] OR;  Service: Orthopedics;  Laterality: Right;   ANGIOPLASTY / STENTING FEMORAL Left 12/11/2013   dr berry   AV FISTULA PLACEMENT Left 03/19/2014   Procedure: CREATION OF ARTERIOVENOUS (AV) FISTULA  LEFT UPPER ARM;  Surgeon: Pryor Ochoa, MD;  Location: [facility] OR;  Service: Vascular;  Laterality: Left;   AV FISTULA PLACEMENT Left 11/09/2020   Procedure: LEFT ARM ARTERIOVENOUS (AV) FISTULA CREATION;  Surgeon: Maeola Harman, MD;  Location: [facility] OR;  Service: Vascular;  Laterality: Left;   AV FISTULA PLACEMENT Left 02/15/2021   Procedure: INSERTION OF LEFT ARM ARTERIOVENOUS GORE-TEX GRAFT;  Surgeon: Maeola Harman, MD;  Location: [facility] OR;  Service: Vascular;  Laterality: Left;   AV FISTULA PLACEMENT Right 02/16/2022   Procedure: RIGHT ARM BRACHIOCEPAHLIC ARTERIOVENOUS (AV) FISTULA CREATION;  Surgeon: Maeola Harman, MD;  Location: [facility] OR;  Service: Vascular;  Laterality: Right;  Regional and MAC   BACK SURGERY  01/2018   screws placed    BASCILIC VEIN TRANSPOSITION Left 12/14/2020   Procedure: REVISION OF LEFT ARM SECOND STAGE BASILIC VEIN TRANSPOSITION;  Surgeon: Maeola Harman, MD;  Location: [facility] OR;  Service: Vascular;  Laterality: Left;   BASCILIC VEIN TRANSPOSITION Right 04/18/2022   Procedure: RIGHT BRACHIOBASILIC FISTULA SECOND STAGE TRANSPOSITION;  Surgeon: Maeola Harman, MD;  Location: [facility] OR;  Service: Vascular;  Laterality: Right;   CARDIAC CATHETERIZATION  2001 and 2010    COLONOSCOPY W/ BIOPSIES AND POLYPECTOMY     COLONOSCOPY WITH PROPOFOL N/A 08/01/2016    Procedure: COLONOSCOPY WITH PROPOFOL;  Surgeon: Jeani Hawking, MD;  Location: [facility]COPY;  Service: Endoscopy;  Laterality: N/A;   COLONOSCOPY WITH PROPOFOL N/A 01/17/2022   Procedure: COLONOSCOPY WITH PROPOFOL;  Surgeon: Jeani Hawking, MD;  Location: [facility];  Service: Gastroenterology;  Laterality: N/A;   CORONARY ARTERY BYPASS GRAFT  2019   [facility] x 1 bypass   ESOPHAGOGASTRODUODENOSCOPY (EGD) WITH PROPOFOL N/A 08/01/2016   Procedure: ESOPHAGOGASTRODUODENOSCOPY (EGD) WITH PROPOFOL;  Surgeon: Jeani Hawking, MD;  Location: [facility];  Service: Endoscopy;  Laterality: N/A;   FISTULA SUPERFICIALIZATION Left 02/10/2020   Procedure: 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;  Surgeon: Larina Earthly, MD;  Location: [facility] OR;  Service: Vascular;  Laterality: Left;   FISTULA SUPERFICIALIZATION Left 03/23/2020   Procedure: LEFT UPPER EXTREMITY ARTERIOVENOUS FISTULA PLICATION;  Surgeon: Larina Earthly, MD;  Location: [facility] OR;  Service: Vascular;  Laterality: Left;   FOOT FRACTURE SURGERY Right    ligament repair  FRACTURE SURGERY     left forearm   GRAFT APPLICATION Right 05/13/2019   Procedure: FAT GRAFT APPLICATION;  Surgeon: Park Liter, DPM;  Location: [facility];  Service: Podiatry;  Laterality: Right;   HEMATOMA EVACUATION Left 11/09/2020   Procedure: EVACUATION HEMATOMA LEFT ARM;  Surgeon: Chuck Hint, MD;  Location: [facility] OR;  Service: Vascular;  Laterality: Left;   INGUINAL HERNIA REPAIR Left    IR AV DIALY SHUNT INTRO NEEDLE/INTRACATH INITIAL W/PTA/IMG LEFT  11/15/2021   IR AV DIALY SHUNT INTRO NEEDLE/INTRACATH INITIAL W/PTA/IMG RIGHT Right 08/15/2022   IR FLUORO GUIDE CV LINE LEFT  01/26/2023   IR FLUORO GUIDE CV LINE RIGHT  01/20/2022   IR FLUORO GUIDE CV LINE RIGHT  05/03/2022   IR REMOVAL TUN CV CATH W/O FL  06/16/2021   IR REMOVAL TUN CV CATH W/O FL  07/06/2022   IR US GUIDE VASC ACCESS LEFT  11/15/2021   IR US GUIDE VASC ACCESS LEFT  11/15/2021   IR US  GUIDE VASC ACCESS LEFT  01/26/2023   IR US GUIDE VASC ACCESS RIGHT  11/15/2021   IR US GUIDE VASC ACCESS RIGHT  01/20/2022   IR US GUIDE VASC ACCESS RIGHT  08/15/2022   LEFT HEART CATH AND CORS/GRAFTS ANGIOGRAPHY N/A 04/27/2017   Procedure: LEFT HEART CATH AND CORS/GRAFTS ANGIOGRAPHY;  Surgeon: Marykay Lex, MD;  Location: [facility] INVASIVE CV LAB;  Service: Cardiovascular;  Laterality: N/A;   LEFT HEART CATHETERIZATION WITH CORONARY ANGIOGRAM N/A 06/22/2014   Procedure: LEFT HEART CATHETERIZATION WITH CORONARY ANGIOGRAM;  Surgeon: Lennette Bihari, MD;  Location: [facility] CATH LAB;  Service: Cardiovascular;  Laterality: N/A;   LOWER EXTREMITY ANGIOGRAM Left 12/11/2013   Procedure: LOWER EXTREMITY ANGIOGRAM;  Surgeon: Runell Gess, MD;  Location: [facility] CATH LAB;  Service: Cardiovascular;  Laterality: Left;   LUMBAR LAMINECTOMY/DECOMPRESSION MICRODISCECTOMY Right 07/03/2017   Procedure: MICRODISCECTOMY LUMBAR FIVE - SACRAL ONE RIGHT;  Surgeon: Lisbeth Renshaw, MD;  Location: [facility] OR;  Service: Neurosurgery;  Laterality: Right;   LUMBAR LAMINECTOMY/DECOMPRESSION MICRODISCECTOMY Right 10/19/2017   Procedure: MICRODISCECTOMY LUMBAR FIVE- SACRAL 1 ONE ;  Surgeon: Lisbeth Renshaw, MD;  Location: [facility] OR;  Service: Neurosurgery;  Laterality: Right;   PERIPHERAL VASCULAR BALLOON ANGIOPLASTY Left 05/19/2021   Procedure: PERIPHERAL VASCULAR BALLOON ANGIOPLASTY;  Surgeon: Cephus Shelling, MD;  Location: [facility] INVASIVE CV LAB;  Service: Cardiovascular;  Laterality: Left;   PERIPHERAL VASCULAR INTERVENTION Left 01/10/2021   Procedure: PERIPHERAL VASCULAR INTERVENTION;  Surgeon: Maeola Harman, MD;  Location: [facility] INVASIVE CV LAB;  Service: Cardiovascular;  Laterality: Left;   POLYPECTOMY  01/17/2022   Procedure: POLYPECTOMY;  Surgeon: Jeani Hawking, MD;  Location[facility];  Service: Gastroenterology;;   THROMBECTOMY W/ EMBOLECTOMY Left 11/09/2020   Procedure: THROMBECTOMY OF LEFT ARTERIOVENOUS FISTULA;  Surgeon:  Chuck Hint, MD;  Location: [facility] OR;  Service: Vascular;  Laterality: Left;   TONSILLECTOMY AND ADENOIDECTOMY     WISDOM TOOTH EXTRACTION     WOUND DEBRIDEMENT Right 05/13/2019   Procedure: DEBRIDEMENT WOUND;  Surgeon: Park Liter, DPM;  Location: [facility];  Service: Podiatry;  Laterality: Right;   Social History:  reports that he quit smoking about 46 years ago. His smoking use included cigarettes. He started smoking about 48 years ago. He has a 2 pack-year smoking history. He has never been exposed to tobacco smoke. He has never used smokeless tobacco. He reports that he does not currently use alcohol. He reports that he  does not currently use drugs after having used the following drugs: Marijuana.  Allergies  Allergen Reactions   Contrast Media [Iodinated Contrast Media] Anaphylaxis    Cardiac arrest, Throat swelling, hives, SOB   Kiwi Extract Itching, Swelling and Other (See Comments)    Lips and face swell- breathing not affected   Flexeril [Cyclobenzaprine]     Hands become flimsy, can not hold things   Tape Other (See Comments)    "Plastic" tape causes blisters!!    Family History  Problem Relation Age of Onset   Heart attack Sister        died @ 66   Cancer Mother        died @ 20; unknown type   Diabetes Brother        deceased   Cirrhosis Father        alcohol related   Diabetes Father    Esophageal cancer Neg Hx    Colon cancer Neg Hx    Pancreatic cancer Neg Hx    Stomach cancer Neg Hx     Prior to Admission medications   Medication Sig Start Date End Date Taking? Authorizing Provider  acetaminophen (TYLENOL) 500 MG tablet Take 1,000 mg by mouth every 6 (six) hours as needed for moderate pain.    [provider]  aspirin EC 81 MG tablet Take 1 tablet (81 mg total) by mouth daily. Patient taking differently: Take 81 mg by mouth at bedtime. 05/28/12   Tereso Newcomer T, PA-C  atorvastatin (LIPITOR) 40 MG tablet Take 1 tablet (40  mg total) by mouth daily at 6 PM. Patient taking differently: Take 40 mg by mouth at bedtime. 12/01/19   Angiulli, Mcarthur Rossetti, PA-C  CALCITRIOL PO Take 1 tablet by mouth every Monday, Wednesday, and Friday.    [provider]  calcium acetate (PHOSLO) 667 MG capsule Take 2 capsules (1,334 mg total) by mouth 2 (two) times daily with a meal. 12/01/19   Angiulli, Mcarthur Rossetti, PA-C  clopidogrel (PLAVIX) 75 MG tablet Take 75 mg by mouth in the morning. 12/01/19   [provider]  gabapentin (NEURONTIN) 100 MG capsule Take 100-200 mg by mouth See admin instructions. 100 mg in the morning, 200 mg in the evening 11/28/19   [provider]  HYDROcodone-acetaminophen (NORCO/VICODIN) 5-325 MG tablet Take 1 tablet by mouth every 6 (six) hours as needed for moderate pain. 07/30/22   Ellsworth Lennox, PA-C  lidocaine (LIDODERM) 5 % Place 1 patch onto the skin daily. Remove & Discard patch within 12 hours or as directed by MD 07/22/22   Gwyneth Sprout, MD  loperamide (IMODIUM) 2 MG capsule Take 2 mg by mouth as needed for diarrhea or loose stools.    [provider]  midodrine (PROAMATINE) 10 MG tablet Take 10 mg by mouth in the morning and at bedtime. 12/01/19   [provider]  multivitamin (RENA-VIT) TABS tablet Take 1 tablet by mouth at bedtime. 12/01/19   Angiulli, Mcarthur Rossetti, PA-C  ONETOUCH ULTRA test strip See admin instructions. 01/14/21   [provider]  oxyCODONE-acetaminophen (PERCOCET/ROXICET) 5-325 MG tablet Take 1 tablet by mouth every 6 (six) hours as needed for severe pain. 07/22/22   Gwyneth Sprout, MD  pantoprazole (PROTONIX) 40 MG tablet Take 40 mg by mouth daily before breakfast.    [provider]  predniSONE (DELTASONE) 10 MG tablet Take 1 tablet (10 mg total) by mouth in the morning, at noon, and at bedtime. 07/30/22   Fayrene Fearing,  Onalee Hua, PA-C  tiZANidine (ZANAFLEX) 4 MG tablet Take 1 tablet (4 mg total) by mouth every 8 (eight) hours as needed for  muscle spasms. 07/30/22   Ellsworth Lennox, PA-C    Physical Exam: Vitals:   01/26/23 2100 01/26/23 2130 01/26/23 2200 01/26/23 2229  BP: (!) 153/89 (!) 145/87 139/83   Pulse: 72 78 76   Resp: 16 18 11    Temp:    97.7 F (36.5 C)  TempSrc:    Oral  SpO2: 100% 100% 100%    Constitutional: NAD, calm, comfortable, well-appearing and sitting upright in bed watching TV.  Also asking for food. Eyes: lids and conjunctivae normal ENMT: Mucous membranes are moist.  Neck: normal, supple Respiratory: clear to auscultation bilaterally, no wheezing, no crackles. Normal respiratory effort. No accessory muscle use.  Cardiovascular: Regular rate and rhythm, no murmurs / rubs / gallops. No extremity edema.  Left-sided tunneled catheter in place. Abdomen: no tenderness, soft, nondistended  musculoskeletal: no clubbing / cyanosis. No joint deformity upper and lower extremities. Normal muscle tone.  Skin: no rashes, lesions, ulcers. No induration Neurologic: CN 2-12 grossly intact. Psychiatric: Normal judgment and insight. Alert and oriented x 3. Normal mood.   Data Reviewed:  See HPI  Assessment and Plan: * Acute metabolic encephalopathy -likely secondary to Versed administered during procedure resulting in hypotension in the setting of uremia, worsening renal function from missed dialysis -mental status improved following improvement in BP from Phenylephrine push  -back to baseline and able to answer questions appropriately  Hypotension -presenting SBP in the 60s suspect secondary to Versed and missed dialysis. Received Phenylephrine push in ED. BP now normalized.  -no longer on midodrine at home  Type II diabetes mellitus (HCC) -7.6 A1C about a year ago -hold sliding scale with current poor renal function   ESRD (end stage renal disease) on dialysis [facility]) -last dialysis on Monday (today is Friday). Creatinine up to 16 from 7 -nephrology Dr. Ronalee Belts recommends dialysis tomorrow -potassium is  normal  -POD 0 of  left chest tunneled dialysis catheter placement with IR   PVD (peripheral vascular disease) (HCC) -Continue aspirin and plavix   CAD (coronary artery disease) nonobstructive per cath 2012 -on aspirin and plavix for PAD      Advance Care Planning:   Code Status: Full Code   Consults: nephrology  Family Communication: Discussed with wife Corrie Dandy over the phone  Severity of Illness: The appropriate patient status for this patient is INPATIENT. Inpatient status is judged to be reasonable and necessary in order to provide the required intensity of service to ensure the patient's safety. The patient's presenting symptoms, physical exam findings, and initial radiographic and laboratory data in the context of their chronic comorbidities is felt to place them at high risk for further clinical deterioration. Furthermore, it is not anticipated that the patient will be medically stable for discharge from the hospital within 2 midnights of admission.   * I certify that at the point of admission it is my clinical judgment that the patient will require inpatient hospital care spanning beyond 2 midnights from the point of admission due to high intensity of service, high risk for further deterioration and high frequency of surveillance required.*  Author: Anselm Jungling, DO 01/26/2023 10:39 PM  For on call review [email or web address].

## 2023-01-26 NOTE — ED Triage Notes (Signed)
Patient BIB from IR due to going unresponsive with v-tach on monitor. Patient was having a HD catheter placed. Patient sustaining V-tach and was given Versed 1mg , and Ancef 2GM while in IR.

## 2023-01-26 NOTE — Significant Event (Signed)
Rapid Response Event Note   Reason for Call :  Paged to IR suite 1  On arrival patient being taken on stretcher to ED 32, minimally responsive, thready pulse with SBP 50s. NRB 15L. Assisted with getting patient hooked to ED monitors. Pickering MD at bedside, IVF infusing, 160 mcg neo stick pushed per MD by ED staff. Report given to ED team by IR RN.   Truddie Crumble, RN

## 2023-01-26 NOTE — Assessment & Plan Note (Addendum)
-  likely secondary to Versed administered during procedure resulting in hypotension in the setting of uremia, worsening renal function from missed dialysis -mental status improved following improvement in BP from Phenylephrine push  -back to baseline and able to answer questions appropriately

## 2023-01-26 NOTE — Assessment & Plan Note (Signed)
-  7.6 A1C about a year ago -hold sliding scale with current poor renal function

## 2023-01-26 NOTE — Procedures (Signed)
Vascular and Interventional Radiology Procedure Note  Patient: Jon Anderson DOB: 01-21-1951 Medical Record Number: [id number] Note Date/Time: 01/26/23 3:46 PM   Performing Physician: Roanna Banning, MD Assistant(s): None  Diagnosis: ESRD requiring Hemodialysis  Procedure: TUNNELED HEMODIALYSIS CATHETER PLACEMENT  Anesthesia:  Single Agent sedation Complications: Dysrhythmia.  Estimated Blood Loss: Minimal Specimens:  None  Findings:  Successful placement of left-sided, 23 cm (tip-to-cuff), tunneled hemodialysis catheter with the tip of the catheter in the proximal right atrium. Pt with dysrhythmia and decreased LOC.   Plan: Catheter ready for use. Pt sent to ER for further evaluation.  See detailed procedure note with images in PACS. The patient tolerated the procedure well without incident or complication and was returned to  ER  in critical condition.    Roanna Banning, MD [facility] Radiology   Pager. [phone] Clinic. [phone]

## 2023-01-26 NOTE — Assessment & Plan Note (Addendum)
-  presenting SBP in the 60s suspect secondary to Versed and missed dialysis. Received Phenylephrine push in ED. BP now normalized.  -no longer on midodrine at home

## 2023-01-26 NOTE — H&P (Signed)
Chief Complaint: Patient was seen in consultation today for tunneled dialysis catheter  Referring Physician(s): Coladonato,Joseph  Supervising Physician: Roanna Banning  Patient Status: [facility] - Out-pt  History of Present Illness: Jon Anderson is a 72 y.o. male with a medical history significant for DM2, PAD, HTN, CAD and ESRD on hemodialysis via right upper arm fistula which was created November 2023. He has a history of left arm fistula. Patient also has a history pf PEA arrest likely secondary to contrast-related anaphylaxis. Patient is familiar to IR from several dialysis access related procedures. He was last seen in IR 08/15/22 for a fistulogram with successful venoplasty of distal basilic vein stenosis.   The patient is now experiencing fistula malfunction and he was unable to receive dialysis today.  Interventional Radiology has been asked to evaluate this patient for an image-guided tunneled dialysis catheter placement.   Past Medical History:  Diagnosis Date   Anemia of chronic disease    Arthritis    CAD (coronary artery disease)    Carotid stenosis    a. <50% RICA, >70% LICA by duplex 07/2015   Chronic chest pain    occ   Constipation    chronic   Dyspnea    occasional with extertion   ESRD (end stage renal disease) on dialysis (HCC)    M-W-F- [name]   GERD (gastroesophageal reflux disease)    History of hiatal hernia    History of kidney stones    Passed   HNP (herniated nucleus pulposus), lumbar    HTN (hypertension)    echo 3/10: EF 60%, LAE   Hyperlipidemia    Mitral stenosis    mild MS 11/15/21   Nephrolithiasis    "passed them all"   PEA (Pulseless electrical activity) (HCC) 11/15/2021   PEA arrest felt likely related to contrast anaphylaxis despite premedication   Peripheral arterial disease (HCC)    a. s/p PTCA  right    Pneumonia yrs ago   Restless legs    Sciatic leg pain    Sleep apnea    does not use cpap   Snores    a. presumed OSA,  pt has refused sleep eval in past.   Type II diabetes mellitus (HCC)    no longer on medications, checks blood glucose at home   Urinary frequency    Uses wheelchair    Walker as ambulation aid     Past Surgical History:  Procedure Laterality Date   A/V FISTULAGRAM Left 01/10/2021   Procedure: A/V FISTULAGRAM;  Surgeon: Maeola Harman, MD;  Location: [facility] INVASIVE CV LAB;  Service: Cardiovascular;  Laterality: Left;   A/V FISTULAGRAM Left 05/19/2021   Procedure: A/V Fistulagram;  Surgeon: Cephus Shelling, MD;  Location: [facility] INVASIVE CV LAB;  Service: Cardiovascular;  Laterality: Left;   ABDOMINAL AORTOGRAM W/LOWER EXTREMITY Bilateral 08/21/2019   Procedure: ABDOMINAL AORTOGRAM W/LOWER EXTREMITY;  Surgeon: Maeola Harman, MD;  Location: [facility] INVASIVE CV LAB;  Service: Cardiovascular;  Laterality: Bilateral;   AMPUTATION Right 07/25/2019   Procedure: RIGHT 5th RAY AMPUTATION;  Surgeon: Nadara Mustard, MD;  Location: [facility] OR;  Service: Orthopedics;  Laterality: Right;   AMPUTATION Right 11/19/2019   Procedure: RIGHT TRANSMETATARSAL AMPUTATION;  Surgeon: Nadara Mustard, MD;  Location: [facility] OR;  Service: Orthopedics;  Laterality: Right;   ANGIOPLASTY / STENTING FEMORAL Left 12/11/2013   dr berry   AV FISTULA PLACEMENT Left 03/19/2014   Procedure: CREATION OF ARTERIOVENOUS (AV) FISTULA  LEFT UPPER ARM;  Surgeon: Pryor Ochoa, MD;  Location: [facility] OR;  Service: Vascular;  Laterality: Left;   AV FISTULA PLACEMENT Left 11/09/2020   Procedure: LEFT ARM ARTERIOVENOUS (AV) FISTULA CREATION;  Surgeon: Maeola Harman, MD;  Location: [facility] OR;  Service: Vascular;  Laterality: Left;   AV FISTULA PLACEMENT Left 02/15/2021   Procedure: INSERTION OF LEFT ARM ARTERIOVENOUS GORE-TEX GRAFT;  Surgeon: Maeola Harman, MD;  Location: [facility] OR;  Service: Vascular;  Laterality: Left;   AV FISTULA PLACEMENT Right 02/16/2022   Procedure: RIGHT ARM BRACHIOCEPAHLIC ARTERIOVENOUS (AV) FISTULA  CREATION;  Surgeon: Maeola Harman, MD;  Location: [facility] OR;  Service: Vascular;  Laterality: Right;  Regional and [facility]   BACK SURGERY  01/2018   screws placed    BASCILIC VEIN TRANSPOSITION Left 12/14/2020   Procedure: REVISION OF LEFT ARM SECOND STAGE BASILIC VEIN TRANSPOSITION;  Surgeon: Maeola Harman, MD;  Location: [facility] OR;  Service: Vascular;  Laterality: Left;   BASCILIC VEIN TRANSPOSITION Right 04/18/2022   Procedure: RIGHT BRACHIOBASILIC FISTULA SECOND STAGE TRANSPOSITION;  Surgeon: Maeola Harman, MD;  Location: [facility] OR;  Service: Vascular;  Laterality: Right;   CARDIAC CATHETERIZATION  2001 and 2010    COLONOSCOPY W/ BIOPSIES AND POLYPECTOMY     COLONOSCOPY WITH PROPOFOL N/A 08/01/2016   Procedure: COLONOSCOPY WITH PROPOFOL;  Surgeon: Jeani Hawking, MD;  Location: [facility] ENDOSCOPY;  Service: Endoscopy;  Laterality: N/A;   COLONOSCOPY WITH PROPOFOL N/A 01/17/2022   Procedure: COLONOSCOPY WITH PROPOFOL;  Surgeon: Jeani Hawking, MD;  Location: [facility] ENDOSCOPY;  Service: Gastroenterology;  Laterality: N/A;   CORONARY ARTERY BYPASS GRAFT  2019   [facility] x 1 bypass   ESOPHAGOGASTRODUODENOSCOPY (EGD) WITH PROPOFOL N/A 08/01/2016   Procedure: ESOPHAGOGASTRODUODENOSCOPY (EGD) WITH PROPOFOL;  Surgeon: Jeani Hawking, MD;  Location: [facility] ENDOSCOPY;  Service: Endoscopy;  Laterality: N/A;   FISTULA SUPERFICIALIZATION Left 02/10/2020   Procedure: 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;  Surgeon: Larina Earthly, MD;  Location: [facility] OR;  Service: Vascular;  Laterality: Left;   FISTULA SUPERFICIALIZATION Left 03/23/2020   Procedure: LEFT UPPER EXTREMITY ARTERIOVENOUS FISTULA PLICATION;  Surgeon: Larina Earthly, MD;  Location: [facility] OR;  Service: Vascular;  Laterality: Left;   FOOT FRACTURE SURGERY Right    ligament repair   FRACTURE SURGERY     left forearm   GRAFT APPLICATION Right 05/13/2019   Procedure: FAT GRAFT APPLICATION;  Surgeon: Park Liter, DPM;  Location: [facility];  Service: Podiatry;  Laterality: Right;   HEMATOMA EVACUATION Left 11/09/2020   Procedure: EVACUATION HEMATOMA LEFT ARM;  Surgeon: Chuck Hint, MD;  Location: [facility] OR;  Service: Vascular;  Laterality: Left;   INGUINAL HERNIA REPAIR Left    IR AV DIALY SHUNT INTRO NEEDLE/INTRACATH INITIAL W/PTA/IMG LEFT  11/15/2021   IR AV DIALY SHUNT INTRO NEEDLE/INTRACATH INITIAL W/PTA/IMG RIGHT Right 08/15/2022   IR FLUORO GUIDE CV LINE RIGHT  01/20/2022   IR FLUORO GUIDE CV LINE RIGHT  05/03/2022   IR REMOVAL TUN CV CATH W/O FL  06/16/2021   IR REMOVAL TUN CV CATH W/O FL  07/06/2022   IR US GUIDE VASC ACCESS LEFT  11/15/2021   IR US GUIDE VASC ACCESS LEFT  11/15/2021   IR US GUIDE VASC ACCESS RIGHT  11/15/2021   IR US GUIDE VASC ACCESS RIGHT  01/20/2022   IR US GUIDE VASC ACCESS RIGHT  08/15/2022   LEFT HEART CATH AND CORS/GRAFTS ANGIOGRAPHY N/A 04/27/2017   Procedure: LEFT HEART CATH AND  CORS/GRAFTS ANGIOGRAPHY;  Surgeon: Marykay Lex, MD;  Location: [facility] INVASIVE CV LAB;  Service: Cardiovascular;  Laterality: N/A;   LEFT HEART CATHETERIZATION WITH CORONARY ANGIOGRAM N/A 06/22/2014   Procedure: LEFT HEART CATHETERIZATION WITH CORONARY ANGIOGRAM;  Surgeon: Lennette Bihari, MD;  Location: [facility] CATH LAB;  Service: Cardiovascular;  Laterality: N/A;   LOWER EXTREMITY ANGIOGRAM Left 12/11/2013   Procedure: LOWER EXTREMITY ANGIOGRAM;  Surgeon: Runell Gess, MD;  Location: [facility] CATH LAB;  Service: Cardiovascular;  Laterality: Left;   LUMBAR LAMINECTOMY/DECOMPRESSION MICRODISCECTOMY Right 07/03/2017   Procedure: MICRODISCECTOMY LUMBAR FIVE - SACRAL ONE RIGHT;  Surgeon: Lisbeth Renshaw, MD;  Location: [facility] OR;  Service: Neurosurgery;  Laterality: Right;   LUMBAR LAMINECTOMY/DECOMPRESSION MICRODISCECTOMY Right 10/19/2017   Procedure: MICRODISCECTOMY LUMBAR FIVE- SACRAL 1 ONE ;  Surgeon: Lisbeth Renshaw, MD;  Location: [facility] OR;  Service: Neurosurgery;  Laterality: Right;   PERIPHERAL VASCULAR BALLOON  ANGIOPLASTY Left 05/19/2021   Procedure: PERIPHERAL VASCULAR BALLOON ANGIOPLASTY;  Surgeon: Cephus Shelling, MD;  Location: [facility] INVASIVE CV LAB;  Service: Cardiovascular;  Laterality: Left;   PERIPHERAL VASCULAR INTERVENTION Left 01/10/2021   Procedure: PERIPHERAL VASCULAR INTERVENTION;  Surgeon: Maeola Harman, MD;  Location: [facility] INVASIVE CV LAB;  Service: Cardiovascular;  Laterality: Left;   POLYPECTOMY  01/17/2022   Procedure: POLYPECTOMY;  Surgeon: Jeani Hawking, MD;  Location: [facility];  Service: Gastroenterology;;   THROMBECTOMY W/ EMBOLECTOMY Left 11/09/2020   Procedure: THROMBECTOMY OF LEFT ARTERIOVENOUS FISTULA;  Surgeon: Chuck Hint, MD;  Location: [facility] OR;  Service: Vascular;  Laterality: Left;   TONSILLECTOMY AND ADENOIDECTOMY     WISDOM TOOTH EXTRACTION     WOUND DEBRIDEMENT Right 05/13/2019   Procedure: DEBRIDEMENT WOUND;  Surgeon: Park Liter, DPM;  Location: [facility] [place];  Service: Podiatry;  Laterality: Right;    Allergies: Contrast media [iodinated contrast media], Kiwi extract, Flexeril [cyclobenzaprine], and Tape  Medications: Prior to Admission medications   Medication Sig Start Date End Date Taking? Authorizing Provider  acetaminophen (TYLENOL) 500 MG tablet Take 1,000 mg by mouth every 6 (six) hours as needed for moderate pain.    [provider]  aspirin EC 81 MG tablet Take 1 tablet (81 mg total) by mouth daily. Patient taking differently: Take 81 mg by mouth at bedtime. 05/28/12   Tereso Newcomer T, PA-C  atorvastatin (LIPITOR) 40 MG tablet Take 1 tablet (40 mg total) by mouth daily at 6 PM. Patient taking differently: Take 40 mg by mouth at bedtime. 12/01/19   Angiulli, Mcarthur Rossetti, PA-C  CALCITRIOL PO Take 1 tablet by mouth every Monday, Wednesday, and Friday.    [provider]  calcium acetate (PHOSLO) 667 MG capsule Take 2 capsules (1,334 mg total) by mouth 2 (two) times daily with a meal. 12/01/19   Angiulli,  Mcarthur Rossetti, PA-C  clopidogrel (PLAVIX) 75 MG tablet Take 75 mg by mouth in the morning. 12/01/19   [provider]  gabapentin (NEURONTIN) 100 MG capsule Take 100-200 mg by mouth See admin instructions. 100 mg in the morning, 200 mg in the evening 11/28/19   [provider]  HYDROcodone-acetaminophen (NORCO/VICODIN) 5-325 MG tablet Take 1 tablet by mouth every 6 (six) hours as needed for moderate pain. 07/30/22   Ellsworth Lennox, PA-C  lidocaine (LIDODERM) 5 % Place 1 patch onto the skin daily. Remove & Discard patch within 12 hours or as directed by MD 07/22/22   Gwyneth Sprout, MD  loperamide (IMODIUM) 2 MG capsule Take 2 mg by  mouth as needed for diarrhea or loose stools.    [provider]  midodrine (PROAMATINE) 10 MG tablet Take 10 mg by mouth in the morning and at bedtime. 12/01/19   [provider]  multivitamin (RENA-VIT) TABS tablet Take 1 tablet by mouth at bedtime. 12/01/19   Angiulli, Mcarthur Rossetti, PA-C  ONETOUCH ULTRA test strip See admin instructions. 01/14/21   [provider]  oxyCODONE-acetaminophen (PERCOCET/ROXICET) 5-325 MG tablet Take 1 tablet by mouth every 6 (six) hours as needed for severe pain. 07/22/22   Gwyneth Sprout, MD  pantoprazole (PROTONIX) 40 MG tablet Take 40 mg by mouth daily before breakfast.    [provider]  predniSONE (DELTASONE) 10 MG tablet Take 1 tablet (10 mg total) by mouth in the morning, at noon, and at bedtime. 07/30/22   Ellsworth Lennox, PA-C  tiZANidine (ZANAFLEX) 4 MG tablet Take 1 tablet (4 mg total) by mouth every 8 (eight) hours as needed for muscle spasms. 07/30/22   Ellsworth Lennox, PA-C     Family History  Problem Relation Age of Onset   Heart attack Sister        died @ 84   Cancer Mother        died @ 72; unknown type   Diabetes Brother        deceased   Cirrhosis Father        alcohol related   Diabetes Father    Esophageal cancer Neg Hx    Colon cancer Neg Hx    Pancreatic cancer Neg Hx     Stomach cancer Neg Hx     Social History   Socioeconomic History   Marital status: Married    Spouse name: Not on file   Number of children: Not on file   Years of education: Not on file   Highest education level: Not on file  Occupational History   Occupation: retired  Tobacco Use   Smoking status: Former    Current packs/day: 0.00    Average packs/day: 1 pack/day for 2.0 years (2.0 ttl pk-yrs)    Types: Cigarettes    Start date: 70    Quit date: 1978    Years since quitting: 46.8    Passive exposure: Never   Smokeless tobacco: Never  Vaping Use   Vaping status: Never Used  Substance and Sexual Activity   Alcohol use: Not Currently    Alcohol/week: 0.0 standard drinks of alcohol    Comment: h/o social drinking   Drug use: Not Currently    Types: Marijuana    Comment: quit 1986   Sexual activity: Yes    Birth control/protection: None  Other Topics Concern   Not on file  Social History Narrative   The patient is a Copy.  He is married and has 2 grown children.  Lives in [place] with his wife.  Denies tobacco, alcohol or IV drug abuse or marijuana or cocaine intake.    Social Determinants of Health   Financial Resource Strain: Not on file  Food Insecurity: Not on file  Transportation Needs: Not on file  Physical Activity: Not on file  Stress: Not on file  Social Connections: Not on file    Review of Systems: A 12 point ROS discussed and pertinent positives are indicated in the HPI above.  All other systems are negative.  Review of Systems  Constitutional:  Negative for appetite change and fatigue.  Respiratory:  Negative for cough and shortness of breath.   Cardiovascular:  Negative for chest pain and leg swelling.  Gastrointestinal:  Negative for abdominal pain, diarrhea, nausea and vomiting.  Genitourinary:  Negative for flank pain.  Musculoskeletal:  Negative for back pain.  Neurological:  Negative for dizziness and headaches.    Vital Signs: BP (!)  174/100   Pulse 71   Temp 98.2 F (36.8 C) (Temporal)   Resp 16   Ht 5\' 9"  (1.753 m)   Wt 172 lb (78 kg)   SpO2 92%   BMI 25.40 kg/m   Physical Exam Constitutional:      General: He is not in acute distress.    Appearance: He is not ill-appearing.  HENT:     Mouth/Throat:     Mouth: Mucous membranes are moist.     Pharynx: Oropharynx is clear.  Cardiovascular:     Rate and Rhythm: Normal rate.     Comments: Right upper arm fistula negative for thrill and bruit.  Pulmonary:     Effort: Pulmonary effort is normal.  Abdominal:     Tenderness: There is no abdominal tenderness.  Skin:    General: Skin is warm and dry.  Neurological:     Mental Status: He is alert and oriented to person, place, and time.  Psychiatric:        Mood and Affect: Mood normal.        Behavior: Behavior normal.        Thought Content: Thought content normal.        Judgment: Judgment normal.     Imaging: VAS US CAROTID  Result Date: 12/29/2022 Carotid Arterial Duplex Study Patient Name:  NUNZIO OLAFSON  Date of Exam:   12/28/2022 Medical Rec #: [id number]           Accession #:    [id number] Date of Birth: February 06, 1951           Patient Gender: M Patient Age:   65 years Exam Location:  [facility] Procedure:      VAS US CAROTID Referring Phys: Christiane Ha [name] --------------------------------------------------------------------------------  Indications:       Carotid artery disease.                    Patient denies any cerebrovascular symptoms at this time. Risk Factors:      Hypertension, Diabetes, past history of smoking, coronary                    artery disease, PAD. Other Factors:     ESRD on HD. Comparison Study:  Previous carotid duplex performed 12/27/21 showed RICA                    velocities of 76/20 cm/sec and LICA velocities of 373/53                    cm/sec. Left ECA 355 cm/sec. Performing Technologist: Olegario Shearer RVT  Examination Guidelines: A complete evaluation includes B-mode  imaging, spectral Doppler, color Doppler, and power Doppler as needed of all accessible portions of each vessel. Bilateral testing is considered an integral part of a complete examination. Limited examinations for reoccurring indications may be performed as noted.  Right Carotid Findings: +----------+--------+--------+--------+------------------+--------+           PSV cm/sEDV cm/sStenosisPlaque DescriptionComments +----------+--------+--------+--------+------------------+--------+ CCA Prox  84      13      <50%    heterogenous               +----------+--------+--------+--------+------------------+--------+  CCA Mid   80      14                                         +----------+--------+--------+--------+------------------+--------+ CCA Distal83      14              heterogenous               +----------+--------+--------+--------+------------------+--------+ ICA Prox  59      18              heterogenous               +----------+--------+--------+--------+------------------+--------+ ICA Mid   76      17      1-39%                              +----------+--------+--------+--------+------------------+--------+ ICA Distal78      29                                         +----------+--------+--------+--------+------------------+--------+ ECA       146     14                                         +----------+--------+--------+--------+------------------+--------+ +----------+--------+-------+----------------+-------------------+           PSV cm/sEDV cmsDescribe        Arm Pressure (mmHG) +----------+--------+-------+----------------+-------------------+ [id number]            Multiphasic, WNL                    +----------+--------+-------+----------------+-------------------+ +---------+--------+--+--------+--+---------+ VertebralPSV cm/s79EDV cm/s18Antegrade +---------+--------+--+--------+--+---------+  Left Carotid Findings:  +----------+--------+--------+--------+------------------+--------+           PSV cm/sEDV cm/sStenosisPlaque DescriptionComments +----------+--------+--------+--------+------------------+--------+ CCA Prox  56      11                                         +----------+--------+--------+--------+------------------+--------+ CCA Mid   61      12      <50%    heterogenous               +----------+--------+--------+--------+------------------+--------+ CCA Distal46      16                                         +----------+--------+--------+--------+------------------+--------+ ICA Prox  355     41      40-59%  heterogenous               +----------+--------+--------+--------+------------------+--------+ ICA Mid   120     27                                         +----------+--------+--------+--------+------------------+--------+ ICA Distal45      23                                         +----------+--------+--------+--------+------------------+--------+  ECA       293     32      >50%                               +----------+--------+--------+--------+------------------+--------+ +----------+--------+--------+----------------+-------------------+           PSV cm/sEDV cm/sDescribe        Arm Pressure (mmHG) +----------+--------+--------+----------------+-------------------+ Subclavian120             Multiphasic, WNL                    +----------+--------+--------+----------------+-------------------+ +---------+--------+--+--------+--+---------+ VertebralPSV cm/s46EDV cm/s12Antegrade +---------+--------+--+--------+--+---------+   Summary: Right Carotid: Velocities in the right ICA are consistent with a 1-39% stenosis.                Non-hemodynamically significant plaque <50% noted in the CCA. Left Carotid: Velocities in the left ICA are consistent with a 40-59% stenosis.               Non-hemodynamically significant plaque <50% noted in  the CCA. The               ECA appears >50% stenosed. Vertebrals:  Bilateral vertebral arteries demonstrate antegrade flow. Subclavians: Normal flow hemodynamics were seen in bilateral subclavian              arteries. *See table(s) above for measurements and observations. Suggest follow up study in 12 months. Electronically signed by Dina Rich MD on 12/29/2022 at 5:48:47 PM.    Final     Labs:  CBC: Recent Labs    02/16/22 0722 04/18/22 1041 07/22/22 0915  WBC  --   --  7.1  HGB 11.9* 12.6* 10.9*  HCT 35.0* 37.0* 34.9*  PLT  --   --  272    COAGS: No results for input(s): "INR", "APTT" in the last 8760 hours.  BMP: Recent Labs    02/16/22 0722 04/18/22 1041 07/22/22 0915 08/15/22 1021  NA 137 136 135 133*  K 3.6 3.8 3.5 4.2  CL 97* 95* 90* 92*  CO2  --   --  29 25  GLUCOSE 112* 117* 132* 203*  BUN 23 24* 26* 19  CALCIUM  --   --  8.2* 8.3*  CREATININE 7.50* 9.10* 8.52* 7.03*  GFRNONAA  --   --  6* 8*    LIVER FUNCTION TESTS: No results for input(s): "BILITOT", "AST", "ALT", "ALKPHOS", "PROT", "ALBUMIN" in the last 8760 hours.  TUMOR MARKERS: No results for input(s): "AFPTM", "CEA", "CA199", "CHROMGRNA" in the last 8760 hours.  Assessment and Plan:  ESRD on hemodialysis; Malfunctioning fistula: Johnpaul L. Lampo, 72 year old male, presents today to the [facility] Interventional Radiology department for an image-guided tunneled dialysis catheter placement.   Risks and benefits discussed with the patient including, but not limited to bleeding, infection, vascular injury, pneumothorax which may require chest tube placement, air embolism or even death  All of the patient's questions were answered, patient is agreeable to proceed. He has been NPO.  He is a full code.   Consent signed and in chart.  Thank you for this interesting consult.  I greatly enjoyed meeting DAANISH SPACK and look forward to participating in their care.  A copy of this report was sent  to the requesting provider on this date.  Electronically Signed: Alwyn Ren, AGACNP-BC [phone] 01/26/2023, 2:45 PM   I spent a total of  30 Minutes  in face to face in clinical consultation, greater than 50% of which was counseling/coordinating care for tunneled dialysis catheter placement.

## 2023-01-26 NOTE — Assessment & Plan Note (Signed)
-  on aspirin and plavix for PAD

## 2023-01-26 NOTE — ED Provider Notes (Signed)
[place] EMERGENCY DEPARTMENT AT [facility] Provider Note   CSN: [id number] Arrival date & time: 01/26/23  1533     History  Chief Complaint  Patient presents with   Unresponsive    Jon Anderson is a 72 y.o. male.  HPI Patient brought in from Short stay/IR.  Was getting tunneled dialysis catheter placed.  Reportedly prior to the procedure had short run of nonsustained V. tach.  Is a dialysis patient with today being Friday and last dialyzed on Monday.  No preoperative labs were reportedly done.  Patient had been given 1 mg of Versed.  Had become more unresponsive.  Hypotensive.  Brought in for further treatment.  Patient reportedly on baseline oxygen the patient cannot provide any history.   Past Medical History:  Diagnosis Date   Anemia of chronic disease    Arthritis    CAD (coronary artery disease)    Carotid stenosis    a. <50% RICA, >70% LICA by duplex 07/2015   Chronic chest pain    occ   Constipation    chronic   Dyspnea    occasional with extertion   ESRD (end stage renal disease) on dialysis (HCC)    M-W-F- [name]   GERD (gastroesophageal reflux disease)    History of hiatal hernia    History of kidney stones    Passed   HNP (herniated nucleus pulposus), lumbar    HTN (hypertension)    echo 3/10: EF 60%, LAE   Hyperlipidemia    Mitral stenosis    mild MS 11/15/21   Nephrolithiasis    "passed them all"   PEA (Pulseless electrical activity) (HCC) 11/15/2021   PEA arrest felt likely related to contrast anaphylaxis despite premedication   Peripheral arterial disease (HCC)    a. s/p PTCA  right    Pneumonia yrs ago   Restless legs    Sciatic leg pain    Sleep apnea    does not use cpap   Snores    a. presumed OSA, pt has refused sleep eval in past.   Type II diabetes mellitus (HCC)    no longer on medications, checks blood glucose at home   Urinary frequency    Uses wheelchair    Walker as ambulation aid     Home  Medications Prior to Admission medications   Medication Sig Start Date End Date Taking? Authorizing Provider  acetaminophen (TYLENOL) 500 MG tablet Take 1,000 mg by mouth every 6 (six) hours as needed for moderate pain.    [provider]  aspirin EC 81 MG tablet Take 1 tablet (81 mg total) by mouth daily. Patient taking differently: Take 81 mg by mouth at bedtime. 05/28/12   Tereso Newcomer T, PA-C  atorvastatin (LIPITOR) 40 MG tablet Take 1 tablet (40 mg total) by mouth daily at 6 PM. Patient taking differently: Take 40 mg by mouth at bedtime. 12/01/19   Angiulli, Mcarthur Rossetti, PA-C  CALCITRIOL PO Take 1 tablet by mouth every Monday, Wednesday, and Friday.    [provider]  calcium acetate (PHOSLO) 667 MG capsule Take 2 capsules (1,334 mg total) by mouth 2 (two) times daily with a meal. 12/01/19   Angiulli, Mcarthur Rossetti, PA-C  clopidogrel (PLAVIX) 75 MG tablet Take 75 mg by mouth in the morning. 12/01/19   [provider]  gabapentin (NEURONTIN) 100 MG capsule Take 100-200 mg by mouth See admin instructions. 100 mg in the morning, 200 mg in the evening 11/28/19  [provider]  HYDROcodone-acetaminophen (NORCO/VICODIN) 5-325 MG tablet Take 1 tablet by mouth every 6 (six) hours as needed for moderate pain. 07/30/22   Ellsworth Lennox, PA-C  lidocaine (LIDODERM) 5 % Place 1 patch onto the skin daily. Remove & Discard patch within 12 hours or as directed by MD 07/22/22   Gwyneth Sprout, MD  loperamide (IMODIUM) 2 MG capsule Take 2 mg by mouth as needed for diarrhea or loose stools.    [provider]  midodrine (PROAMATINE) 10 MG tablet Take 10 mg by mouth in the morning and at bedtime. 12/01/19   [provider]  multivitamin (RENA-VIT) TABS tablet Take 1 tablet by mouth at bedtime. 12/01/19   Angiulli, Mcarthur Rossetti, PA-C  ONETOUCH ULTRA test strip See admin instructions. 01/14/21   [provider]  oxyCODONE-acetaminophen (PERCOCET/ROXICET) 5-325 MG tablet  Take 1 tablet by mouth every 6 (six) hours as needed for severe pain. 07/22/22   Gwyneth Sprout, MD  pantoprazole (PROTONIX) 40 MG tablet Take 40 mg by mouth daily before breakfast.    [provider]  predniSONE (DELTASONE) 10 MG tablet Take 1 tablet (10 mg total) by mouth in the morning, at noon, and at bedtime. 07/30/22   Ellsworth Lennox, PA-C  tiZANidine (ZANAFLEX) 4 MG tablet Take 1 tablet (4 mg total) by mouth every 8 (eight) hours as needed for muscle spasms. 07/30/22   Ellsworth Lennox, PA-C      Allergies    Contrast media [iodinated contrast media], Kiwi extract, Flexeril [cyclobenzaprine], and Tape    Review of Systems   Review of Systems  Physical Exam Updated Vital Signs BP 132/69   Pulse 85   Resp (!) 22   SpO2 100%  Physical Exam Vitals and nursing note reviewed.  Eyes:     Comments: Pupils constricted bilaterally.  Cardiovascular:     Rate and Rhythm: Tachycardia present.  Pulmonary:     Breath sounds: Rales present.     Comments: Rales bilaterally. Musculoskeletal:        General: No tenderness.     Comments: Old dialysis graft left upper extremity.  Nonfunctioning.  Dialysis catheter left chest wall.  Neurological:     Comments: Decreased mental status.  Will arouse somewhat to deep stimulation but really cannot provide much history.     ED Results / Procedures / Treatments   Labs (all labs ordered are listed, but only abnormal results are displayed) Labs Reviewed  CBC WITH DIFFERENTIAL/PLATELET - Abnormal; Notable for the following components:      Result Value   RBC 3.14 (*)    Hemoglobin 9.2 (*)    HCT 28.2 (*)    RDW 15.6 (*)    Neutro Abs 0.8 (*)    Lymphs Abs 5.3 (*)    nRBC 1 (*)    Abs Immature Granulocytes 0.10 (*)    All other components within normal limits  COMPREHENSIVE METABOLIC PANEL - Abnormal; Notable for the following components:   Chloride 96 (*)    CO2 21 (*)    Glucose, Bld 110 (*)    BUN 91 (*)    Creatinine, Ser 16.41  (*)    Total Protein 5.6 (*)    Albumin 3.0 (*)    AST 12 (*)    GFR, Estimated 3 (*)    Anion gap 22 (*)    All other components within normal limits  I-STAT CHEM 8, ED - Abnormal; Notable for the following components:   BUN 96 (*)  Creatinine, Ser >18.00 (*)    Glucose, Bld 103 (*)    Calcium, Ion 1.07 (*)    Hemoglobin 9.5 (*)    HCT 28.0 (*)    All other components within normal limits  I-STAT ARTERIAL BLOOD GAS, ED - Abnormal; Notable for the following components:   pO2, Arterial 81 (*)    Acid-base deficit 4.0 (*)    Sodium 134 (*)    Potassium 3.4 (*)    Calcium, Ion 1.03 (*)    HCT 26.0 (*)    Hemoglobin 8.8 (*)    All other components within normal limits  I-STAT CG4 LACTIC ACID, ED - Abnormal; Notable for the following components:   Lactic Acid, Venous 2.1 (*)    All other components within normal limits  I-STAT CG4 LACTIC ACID, ED - Abnormal; Notable for the following components:   Lactic Acid, Venous 2.0 (*)    All other components within normal limits  CULTURE, BLOOD (ROUTINE X 2)  CULTURE, BLOOD (ROUTINE X 2)  MAGNESIUM    EKG EKG Interpretation Date/Time:  Friday January 26 2023 15:44:04 EDT Ventricular Rate:  116 PR Interval:  140 QRS Duration:  98 QT Interval:  359 QTC Calculation: 499 R Axis:   39  Text Interpretation: Sinus tachycardia Borderline prolonged QT interval Confirmed by Benjiman Core [phone]) on 01/26/2023 5:26:03 PM  Radiology IR Fluoro Guide CV Line Left  Result Date: 01/26/2023 INDICATION: diaylsis; malfunctioning hemodialysis access Patient on MWF HD via RIGHT upper extremity fistula, reportedly thrombosed. Last reported HD on Monday. EXAM: TUNNELED CENTRAL VENOUS HEMODIALYSIS CATHETER PLACEMENT WITH ULTRASOUND AND FLUOROSCOPIC GUIDANCE MEDICATIONS: Ancef 2 gm IV . The antibiotic was given in an appropriate time interval prior to skin puncture. ANESTHESIA/SEDATION: Local anesthetic and single agent sedation was employed during  this procedure. A total of Versed 1 mg was administered intravenously. The patient's level of consciousness and vital signs were monitored continuously by radiology nursing throughout the procedure under my direct supervision. FLUOROSCOPY TIME:  Fluoroscopic dose; 2 mGy COMPLICATIONS: SIR LEVEL C - Requires therapy, minor hospitalization (<48 hrs). PROCEDURE: Informed written consent was obtained from the the patient and/or patient's representative after a discussion of the risks, benefits, and alternatives to treatment. Questions regarding the procedure were encouraged and answered. The LEFT neck and chest were prepped with chlorhexidine in a sterile fashion, and a sterile drape was applied covering the operative field. Maximum barrier sterile technique with sterile gowns and gloves were used for the procedure. A timeout was performed prior to the initiation of the procedure. After creating a small venotomy incision, a micropuncture kit was utilized to access the internal jugular vein. Real-time ultrasound guidance was utilized for vascular access including the acquisition of a permanent ultrasound image documenting patency of the accessed vessel. The microwire was utilized to measure appropriate catheter length. A stiff Glidewire was advanced to the level of the IVC and the micropuncture sheath was exchanged for a peel-away sheath. A palindrome tunneled hemodialysis catheter measuring 23 cm from tip to cuff was tunneled in a retrograde fashion from the anterior chest wall to the venotomy incision. The catheter was then placed through the peel-away sheath with tips ultimately positioned within the superior aspect of the right atrium. Final catheter positioning was confirmed and documented with a spot radiographic image. The catheter aspirates and flushes normally. The catheter was flushed with appropriate volume heparin dwells. The catheter exit site was secured with a 0-Prolene retention suture. The venotomy  incision was closed with  Dermabond. Dressings were applied. The patient experienced dysrhythmia and altered mental status. The decision was made to transfer the patient to the ER for further evaluation. IMPRESSION: Successful placement of 23 cm tip to cuff tunneled hemodialysis catheter via the LEFT internal jugular vein The tip of the catheter is positioned at the superior cavo-atrial junction. The catheter is ready for immediate use. Roanna Banning, MD [facility] Electronically Signed   By: Roanna Banning M.D.   On: 01/26/2023 16:08   IR US Guide Vasc Access Left  Result Date: 01/26/2023 INDICATION: diaylsis; malfunctioning hemodialysis access Patient on MWF HD via RIGHT upper extremity fistula, reportedly thrombosed. Last reported HD on Monday. EXAM: TUNNELED CENTRAL VENOUS HEMODIALYSIS CATHETER PLACEMENT WITH ULTRASOUND AND FLUOROSCOPIC GUIDANCE MEDICATIONS: Ancef 2 gm IV . The antibiotic was given in an appropriate time interval prior to skin puncture. ANESTHESIA/SEDATION: Local anesthetic and single agent sedation was employed during this procedure. A total of Versed 1 mg was administered intravenously. The patient's level of consciousness and vital signs were monitored continuously by radiology nursing throughout the procedure under my direct supervision. FLUOROSCOPY TIME:  Fluoroscopic dose; 2 mGy COMPLICATIONS: SIR LEVEL C - Requires therapy, minor hospitalization (<48 hrs). PROCEDURE: Informed written consent was obtained from the the patient and/or patient's representative after a discussion of the risks, benefits, and alternatives to treatment. Questions regarding the procedure were encouraged and answered. The LEFT neck and chest were prepped with chlorhexidine in a sterile fashion, and a sterile drape was applied covering the operative field. Maximum barrier sterile technique with sterile gowns and gloves were used for the procedure. A timeout  was performed prior to the initiation of the procedure. After creating a small venotomy incision, a micropuncture kit was utilized to access the internal jugular vein. Real-time ultrasound guidance was utilized for vascular access including the acquisition of a permanent ultrasound image documenting patency of the accessed vessel. The microwire was utilized to measure appropriate catheter length. A stiff Glidewire was advanced to the level of the IVC and the micropuncture sheath was exchanged for a peel-away sheath. A palindrome tunneled hemodialysis catheter measuring 23 cm from tip to cuff was tunneled in a retrograde fashion from the anterior chest wall to the venotomy incision. The catheter was then placed through the peel-away sheath with tips ultimately positioned within the superior aspect of the right atrium. Final catheter positioning was confirmed and documented with a spot radiographic image. The catheter aspirates and flushes normally. The catheter was flushed with appropriate volume heparin dwells. The catheter exit site was secured with a 0-Prolene retention suture. The venotomy incision was closed with Dermabond. Dressings were applied. The patient experienced dysrhythmia and altered mental status. The decision was made to transfer the patient to the ER for further evaluation. IMPRESSION: Successful placement of 23 cm tip to cuff tunneled hemodialysis catheter via the LEFT internal jugular vein The tip of the catheter is positioned at the superior cavo-atrial junction. The catheter is ready for immediate use. Roanna Banning, MD [facility] Electronically Signed   By: Roanna Banning M.D.   On: 01/26/2023 16:08   DG Chest Portable 1 View  Result Date: 01/26/2023 CLINICAL DATA:  AMS EXAM: PORTABLE CHEST 1 VIEW COMPARISON:  Chest XR, 07/22/2022.  CT chest, 07/17/2018. FINDINGS: Support lines: LEFT chest dialysis catheter, with tip at the superior  cavoatrial junction. Cardiac silhouette is within normal limits. Aortic arch atherosclerosis. Lungs are hypoinflated with perihilar interstitial thickening  and trace layering fluid along the minor fissure. No focal consolidation or mass. No pleural effusion or pneumothorax. No acute osseous abnormality. IMPRESSION: 1. LEFT chest dialysis catheter, well-positioned. 2. Interstitial thickening suspicious for early versus developing pulmonary edema. Electronically Signed   By: Roanna Banning M.D.   On: 01/26/2023 16:01    Procedures Procedures    Medications Ordered in ED Medications  phenylephrine (NEO-SYNEPHRINE) injection (160 mcg Intravenous Given 01/26/23 1537)  naloxone (NARCAN) injection 0.2 mg (0.2 mg Intravenous Given 01/26/23 1546)    ED Course/ Medical Decision Making/ A&P                                 Medical Decision Making Amount and/or Complexity of Data Reviewed Labs: ordered. Radiology: ordered.  Risk Prescription drug management. Decision regarding hospitalization.   Patient brought in for decreased mental status with procedure.  Reportedly had V. tach prior to procedure.  Then got medication became much more sedate.  Pupils constricted.  Reportedly received no opiates.  Hypotensive upon arrival.  Last reported blood work was in May.  With dialysis and clinically being volume overloaded there was worry for hyperkalemia.  Calcium empirically given.  However EKG is not wide and T waves reassuring.  Narcan given without relief.  Does have decreased mental status.  Potentially could be due to uremia from dialysis.  Intracranial hemorrhage considered and will get CT head CT scan.  Chest x-ray does show volume overload.  Initially received phenylephrine push dose for hypotension.  Blood pressures steadily improved.  Mental status also improving some.  Still not back at baseline but will answer some questions.  Mental status continues to improve.  Able to answer more questions.   Blood pressure also improving.  Hypotension did develop after Versed.  Potentially could have been from that.  Does however appear to need more urgent dialysis.  Reassuring potassium but does appear somewhat volume overloaded.  Head CT independent interpreted and does not show bleeding.  Will discuss with nephrology and will discuss with hospitalist for admission.  CRITICAL CARE Performed by: Benjiman Core Total critical care time: 30 minutes Critical care time was exclusive of separately billable procedures and treating other patients. Critical care was necessary to treat or prevent imminent or life-threatening deterioration. Critical care was time spent personally by me on the following activities: development of treatment plan with patient and/or surrogate as well as nursing, discussions with consultants, evaluation of patient's response to treatment, examination of patient, obtaining history from patient or surrogate, ordering and performing treatments and interventions, ordering and review of laboratory studies, ordering and review of radiographic studies, pulse oximetry and re-evaluation of patient's condition.  Dr. Ronalee Belts from nephrology called back.  Since more stable now will dialyze likely tomorrow.  Will see in the morning.  Wants to ensure stability before dialysis.           Final Clinical Impression(s) / ED Diagnoses Final diagnoses:  Encephalopathy, unspecified type  End stage renal disease on dialysis [facility]  Ventricular tachycardia [facility])    Rx / DC Orders ED Discharge Orders     None         Benjiman Core, MD 01/26/23 [phone]

## 2023-01-27 DIAGNOSIS — G9341 Metabolic encephalopathy: Secondary | ICD-10-CM | POA: Diagnosis not present

## 2023-01-27 LAB — CBC
HCT: 28.1 % — ABNORMAL LOW (ref 39.0–52.0)
Hemoglobin: 9.3 g/dL — ABNORMAL LOW (ref 13.0–17.0)
MCH: 29.2 pg (ref 26.0–34.0)
MCHC: 33.1 g/dL (ref 30.0–36.0)
MCV: 88.4 fL (ref 80.0–100.0)
Platelets: 160 10*3/uL (ref 150–400)
RBC: 3.18 MIL/uL — ABNORMAL LOW (ref 4.22–5.81)
RDW: 15.4 % (ref 11.5–15.5)
WBC: 11.7 10*3/uL — ABNORMAL HIGH (ref 4.0–10.5)
nRBC: 0 % (ref 0.0–0.2)

## 2023-01-27 LAB — HEPATITIS B SURFACE ANTIGEN: Hepatitis B Surface Ag: NONREACTIVE

## 2023-01-27 LAB — BASIC METABOLIC PANEL
Anion gap: 18 — ABNORMAL HIGH (ref 5–15)
BUN: 100 mg/dL — ABNORMAL HIGH (ref 8–23)
CO2: 21 mmol/L — ABNORMAL LOW (ref 22–32)
Calcium: 9.9 mg/dL (ref 8.9–10.3)
Chloride: 96 mmol/L — ABNORMAL LOW (ref 98–111)
Creatinine, Ser: 17.33 mg/dL — ABNORMAL HIGH (ref 0.61–1.24)
GFR, Estimated: 3 mL/min — ABNORMAL LOW (ref >60)
Glucose, Bld: 108 mg/dL — ABNORMAL HIGH (ref 70–99)
Potassium: 5.6 mmol/L — ABNORMAL HIGH (ref 3.5–5.1)
Sodium: 135 mmol/L (ref 135–145)

## 2023-01-27 MED ORDER — HEPARIN SODIUM (PORCINE) 1000 UNIT/ML IJ SOLN
INTRAMUSCULAR | Status: AC
  Administered 2023-01-27: 3800 [IU]
  Filled 2023-01-27: qty 4

## 2023-01-27 MED ORDER — CHLORHEXIDINE GLUCONATE CLOTH 2 % EX PADS: 6.0000 | MEDICATED_PAD | Freq: Every day | CUTANEOUS | Status: DC

## 2023-01-27 MED ORDER — CALCITRIOL 0.5 MCG PO CAPS
1.0000 ug | ORAL_CAPSULE | Freq: Once | ORAL | Status: AC
  Administered 2023-01-27: 1 ug via ORAL
  Filled 2023-01-27 (×2): qty 2

## 2023-01-27 MED ORDER — ALTEPLASE 2 MG IJ SOLR: 2.0000 mg | Freq: Once | INTRAMUSCULAR | Status: DC | PRN

## 2023-01-27 MED ORDER — HEPARIN SODIUM (PORCINE) 1000 UNIT/ML DIALYSIS
1000.0000 [IU] | INTRAMUSCULAR | Status: DC | PRN
  Filled 2023-01-27: qty 1

## 2023-01-27 MED ORDER — ANTICOAGULANT SODIUM CITRATE 4% (200MG/5ML) IV SOLN: 5.0000 mL | Status: DC | PRN

## 2023-01-27 NOTE — Discharge Planning (Signed)
[facility] Patient Discharge Orders- [facility] CLINIC: [facility]  Patient's name: Jon Anderson Admit/DC Dates: 01/26/2023 - 01/27/23  Discharge Diagnoses: Acute metabolic encephalopathy - likely 2/2 hypotension + uremia d/t missed HD   Hypotension - in setting of versed +uremia Clotted dialysis access  Aranesp: Given: no  Last Hgb: 9.3 PRBC's Given: no  ESA dose for discharge: start Mircera q2wks IV Iron dose at discharge: no change  Heparin change: no  EDW Change: no   Bath Change: no  Access intervention/Change: yes  Details:TDC placed by IR. AVF clotted  Hectorol/Calcitriol change: no  Discharge Labs: Calcium 9.9 Albumin 3.0 K+ 5.6  IV Antibiotics: no Details:  On Coumadin?: no Last INR: Next INR: Managed By:   OTHER/APPTS/LAB ORDERS:  Will need referral to [facility] for clotted AVF.    D/C Meds to be reconciled by nurse after every discharge.  Completed By: Virgina Norfolk, PA-C   Reviewed by: MD:______ RN_______

## 2023-01-27 NOTE — Procedures (Signed)
HD Note:  Some information was entered later than the data was gathered due to patient care needs. The stated time with the data is accurate.  Received patient in bed to unit.   Alert and oriented.   Informed consent signed and in chart.   Access used: Left upper chest HD catheter Access issues: Atrial line was difficult to pull if done quickly, it would catch.  Lines reversed for treatment with no issues noted.  Patient had an episode of shaking and not responding when SBP went into the 70s.  200 ml NS bolus given, UF turned off, BP went up immediately after fluids.  Patient became responsive quickly after fluids.  See flowsheet. Following the low blood pressure incident, the patient complained of leg cramping.  UF remained off because of the cramping until resolved.  TX duration:4 hours  Alert, without acute distress.  Total UF removed: 2200 ml  Hand-off given to patient's nurse.   Transported back to the room   Betania Dizon L. Dareen Piano, RN [facility] D[facility] Unit.

## 2023-01-27 NOTE — Consult Note (Signed)
[name] Renal Consultation Note    Indication for Consultation:  Management of ESRD/hemodialysis; anemia, hypertension/volume and secondary hyperparathyroidism  WUJ:[id number], Jon Mutters, MD  HPI: Jon Anderson is a 72 y.o. male with ESRD on HD MWF at [facility].  Past medical history significant for CAD, HTN, HLD, PAD s/p TMA, RLS, OSA and Hx PEA arrest thought to be 2/2 contrast anaphylaxis.    Patient presented to the ED from short stay after becoming unresponsive during [facility] insertion.  Noted to have dysrhythmia, hypotension and decreased LOC post procedure so sent to ED for further evaluation.   Narcan given without improvement.  BP improved with phenylephrine. Pertinant findings in the ED included nml head CT, CXR with early pulmonary edema, Scr 17.33, lactic acid 2, and K initially 3.4, now 5.6.  Patient admitted to observation for further evaluation.    Patient seen and examined at bedside. Reports they were having issues with his fistula on Monday and he did not go to HD Wednesday.  On Friday his access was clotted and he was scheduled for [facility] placement with IR.  Today he is feeling better.  Admits to pain in his neck.  Denies CP, SOB, abdominal pain and n/v/d. Prior to yesterday he was feeling at baseline.  Believes he has gained body weight and may need adjustment of his EDW.   Past Medical History:  Diagnosis Date   Anemia of chronic disease    Arthritis    CAD (coronary artery disease)    Carotid stenosis    a. <50% RICA, >70% LICA by duplex 07/2015   Chronic chest pain    occ   Constipation    chronic   Dyspnea    occasional with extertion   ESRD (end stage renal disease) on dialysis (HCC)    M-W-F- [name]   GERD (gastroesophageal reflux disease)    History of hiatal hernia    History of kidney stones    Passed   HNP (herniated nucleus pulposus), lumbar    HTN (hypertension)    echo 3/10: EF 60%, LAE   Hyperlipidemia    Mitral stenosis    mild MS  11/15/21   Nephrolithiasis    "passed them all"   PEA (Pulseless electrical activity) (HCC) 11/15/2021   PEA arrest felt likely related to contrast anaphylaxis despite premedication   Peripheral arterial disease (HCC)    a. s/p PTCA  right    Pneumonia yrs ago   Restless legs    Sciatic leg pain    Sleep apnea    does not use cpap   Snores    a. presumed OSA, pt has refused sleep eval in past.   Type II diabetes mellitus (HCC)    no longer on medications, checks blood glucose at home   Urinary frequency    Uses wheelchair    Walker as ambulation aid    Past Surgical History:  Procedure Laterality Date   A/V FISTULAGRAM Left 01/10/2021   Procedure: A/V FISTULAGRAM;  Surgeon: Jon Harman, MD;  Location: [facility] INVASIVE CV LAB;  Service: Cardiovascular;  Laterality: Left;   A/V FISTULAGRAM Left 05/19/2021   Procedure: A/V Fistulagram;  Surgeon: Jon Shelling, MD;  Location: [facility] INVASIVE CV LAB;  Service: Cardiovascular;  Laterality: Left;   ABDOMINAL AORTOGRAM W/LOWER EXTREMITY Bilateral 08/21/2019   Procedure: ABDOMINAL AORTOGRAM W/LOWER EXTREMITY;  Surgeon: Jon Harman, MD;  Location: [facility] INVASIVE CV LAB;  Service: Cardiovascular;  Laterality: Bilateral;   AMPUTATION  Right 07/25/2019   Procedure: RIGHT 5th RAY AMPUTATION;  Surgeon: Jon Mustard, MD;  Location: [facility] OR;  Service: Orthopedics;  Laterality: Right;   AMPUTATION Right 11/19/2019   Procedure: RIGHT TRANSMETATARSAL AMPUTATION;  Surgeon: Jon Mustard, MD;  Location: [facility] OR;  Service: Orthopedics;  Laterality: Right;   ANGIOPLASTY / STENTING FEMORAL Left 12/11/2013   dr berry   AV FISTULA PLACEMENT Left 03/19/2014   Procedure: CREATION OF ARTERIOVENOUS (AV) FISTULA  LEFT UPPER ARM;  Surgeon: Jon Ochoa, MD;  Location: [facility] OR;  Service: Vascular;  Laterality: Left;   AV FISTULA PLACEMENT Left 11/09/2020   Procedure: LEFT ARM ARTERIOVENOUS (AV) FISTULA CREATION;  Surgeon: Jon Harman, MD;   Location: [facility] OR;  Service: Vascular;  Laterality: Left;   AV FISTULA PLACEMENT Left 02/15/2021   Procedure: INSERTION OF LEFT ARM ARTERIOVENOUS GORE-TEX GRAFT;  Surgeon: Jon Harman, MD;  Location: [facility] OR;  Service: Vascular;  Laterality: Left;   AV FISTULA PLACEMENT Right 02/16/2022   Procedure: RIGHT ARM BRACHIOCEPAHLIC ARTERIOVENOUS (AV) FISTULA CREATION;  Surgeon: Jon Harman, MD;  Location: [facility] OR;  Service: Vascular;  Laterality: Right;  Regional and MAC   BACK SURGERY  01/2018   screws placed    BASCILIC VEIN TRANSPOSITION Left 12/14/2020   Procedure: REVISION OF LEFT ARM SECOND STAGE BASILIC VEIN TRANSPOSITION;  Surgeon: Jon Harman, MD;  Location: [facility] OR;  Service: Vascular;  Laterality: Left;   BASCILIC VEIN TRANSPOSITION Right 04/18/2022   Procedure: RIGHT BRACHIOBASILIC FISTULA SECOND STAGE TRANSPOSITION;  Surgeon: Jon Harman, MD;  Location: [facility] OR;  Service: Vascular;  Laterality: Right;   CARDIAC CATHETERIZATION  2001 and 2010    COLONOSCOPY W/ BIOPSIES AND POLYPECTOMY     COLONOSCOPY WITH PROPOFOL N/A 08/01/2016   Procedure: COLONOSCOPY WITH PROPOFOL;  Surgeon: Jon Hawking, MD;  Location: [facility] ENDOSCOPY;  Service: Endoscopy;  Laterality: N/A;   COLONOSCOPY WITH PROPOFOL N/A 01/17/2022   Procedure: COLONOSCOPY WITH PROPOFOL;  Surgeon: Jon Hawking, MD;  Location: [facility] ENDOSCOPY;  Service: Gastroenterology;  Laterality: N/A;   CORONARY ARTERY BYPASS GRAFT  2019   [facility] x [facility] bypass   ESOPHAGOGASTRODUODENOSCOPY (EGD) WITH PROPOFOL N/A 08/01/2016   Procedure: ESOPHAGOGASTRODUODENOSCOPY (EGD) WITH PROPOFOL;  Surgeon: Jon Hawking, MD;  Location: [facility] ENDOSCOPY;  Service: Endoscopy;  Laterality: N/A;   FISTULA SUPERFICIALIZATION Left 02/10/2020   Procedure: 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;  Surgeon: Larina Earthly, MD;  Location: [facility] OR;  Service: Vascular;  Laterality: Left;   FISTULA SUPERFICIALIZATION Left 03/23/2020    Procedure: LEFT UPPER EXTREMITY ARTERIOVENOUS FISTULA PLICATION;  Surgeon: Larina Earthly, MD;  Location: [facility] OR;  Service: Vascular;  Laterality: Left;   FOOT FRACTURE SURGERY Right    ligament repair   FRACTURE SURGERY     left forearm   GRAFT APPLICATION Right 05/13/2019   Procedure: FAT GRAFT APPLICATION;  Surgeon: Park Liter, DPM;  Location: [facility];  Service: Podiatry;  Laterality: Right;   HEMATOMA EVACUATION Left 11/09/2020   Procedure: EVACUATION HEMATOMA LEFT ARM;  Surgeon: Chuck Hint, MD;  Location: [facility] OR;  Service: Vascular;  Laterality: Left;   INGUINAL HERNIA REPAIR Left    IR AV DIALY SHUNT INTRO NEEDLE/INTRACATH INITIAL W/PTA/IMG LEFT  11/15/2021   IR AV DIALY SHUNT INTRO NEEDLE/INTRACATH INITIAL W/PTA/IMG RIGHT Right 08/15/2022   IR FLUORO GUIDE CV LINE LEFT  01/26/2023   IR FLUORO GUIDE CV LINE RIGHT  01/20/2022   IR FLUORO GUIDE  CV LINE RIGHT  05/03/2022   IR REMOVAL TUN CV CATH W/O FL  06/16/2021   IR REMOVAL TUN CV CATH W/O FL  07/06/2022   IR US GUIDE VASC ACCESS LEFT  11/15/2021   IR US GUIDE VASC ACCESS LEFT  11/15/2021   IR US GUIDE VASC ACCESS LEFT  01/26/2023   IR US GUIDE VASC ACCESS RIGHT  11/15/2021   IR US GUIDE VASC ACCESS RIGHT  01/20/2022   IR US GUIDE VASC ACCESS RIGHT  08/15/2022   LEFT HEART CATH AND CORS/GRAFTS ANGIOGRAPHY N/A 04/27/2017   Procedure: LEFT HEART CATH AND CORS/GRAFTS ANGIOGRAPHY;  Surgeon: Marykay Lex, MD;  Location: [facility] INVASIVE CV LAB;  Service: Cardiovascular;  Laterality: N/A;   LEFT HEART CATHETERIZATION WITH CORONARY ANGIOGRAM N/A 06/22/2014   Procedure: LEFT HEART CATHETERIZATION WITH CORONARY ANGIOGRAM;  Surgeon: Lennette Bihari, MD;  Location: [facility] CATH LAB;  Service: Cardiovascular;  Laterality: N/A;   LOWER EXTREMITY ANGIOGRAM Left 12/11/2013   Procedure: LOWER EXTREMITY ANGIOGRAM;  Surgeon: Runell Gess, MD;  Location: [facility] CATH LAB;  Service: Cardiovascular;  Laterality: Left;   LUMBAR  LAMINECTOMY/DECOMPRESSION MICRODISCECTOMY Right 07/03/2017   Procedure: MICRODISCECTOMY LUMBAR FIVE - SACRAL ONE RIGHT;  Surgeon: Lisbeth Renshaw, MD;  Location: [facility] OR;  Service: Neurosurgery;  Laterality: Right;   LUMBAR LAMINECTOMY/DECOMPRESSION MICRODISCECTOMY Right 10/19/2017   Procedure: MICRODISCECTOMY LUMBAR FIVE- SACRAL 1 ONE ;  Surgeon: Lisbeth Renshaw, MD;  Location: [facility] OR;  Service: Neurosurgery;  Laterality: Right;   PERIPHERAL VASCULAR BALLOON ANGIOPLASTY Left 05/19/2021   Procedure: PERIPHERAL VASCULAR BALLOON ANGIOPLASTY;  Surgeon: Jon Shelling, MD;  Location: [facility] INVASIVE CV LAB;  Service: Cardiovascular;  Laterality: Left;   PERIPHERAL VASCULAR INTERVENTION Left 01/10/2021   Procedure: PERIPHERAL VASCULAR INTERVENTION;  Surgeon: Jon Harman, MD;  Location: [facility] INVASIVE CV LAB;  Service: Cardiovascular;  Laterality: Left;   POLYPECTOMY  01/17/2022   Procedure: POLYPECTOMY;  Surgeon: Jon Hawking, MD;  Location: [facility];  Service: Gastroenterology;;   THROMBECTOMY W/ EMBOLECTOMY Left 11/09/2020   Procedure: THROMBECTOMY OF LEFT ARTERIOVENOUS FISTULA;  Surgeon: Chuck Hint, MD;  Location: [facility] OR;  Service: Vascular;  Laterality: Left;   TONSILLECTOMY AND ADENOIDECTOMY     WISDOM TOOTH EXTRACTION     WOUND DEBRIDEMENT Right 05/13/2019   Procedure: DEBRIDEMENT WOUND;  Surgeon: Park Liter, DPM;  Location: [facility] [place];  Service: Podiatry;  Laterality: Right;   Family History  Problem Relation Age of Onset   Heart attack Sister        died @ 68   Cancer Mother        died @ 1; unknown type   Diabetes Brother        deceased   Cirrhosis Father        alcohol related   Diabetes Father    Esophageal cancer Neg Hx    Colon cancer Neg Hx    Pancreatic cancer Neg Hx    Stomach cancer Neg Hx    Social History:  reports that he quit smoking about 46 years ago. His smoking use included cigarettes. He started smoking about 48  years ago. He has a 2 pack-year smoking history. He has never been exposed to tobacco smoke. He has never used smokeless tobacco. He reports that he does not currently use alcohol. He reports that he does not currently use drugs after having used the following drugs: Marijuana. Allergies  Allergen Reactions   Contrast Media [Iodinated Contrast Media] Anaphylaxis  Cardiac arrest, Throat swelling, hives, SOB   Kiwi Extract Itching, Swelling and Other (See Comments)    Lips and face swell- breathing not affected   Flexeril [Cyclobenzaprine]     Hands become flimsy, can not hold things   Tape Other (See Comments)    "Plastic" tape causes blisters!!   Prior to Admission medications   Medication Sig Start Date End Date Taking? Authorizing Provider  atorvastatin (LIPITOR) 40 MG tablet Take 1 tablet (40 mg total) by mouth daily at 6 PM. 12/01/19  Yes Angiulli, Mcarthur Rossetti, PA-C  calcium acetate (PHOSLO) 667 MG capsule Take 2 capsules (1,334 mg total) by mouth 2 (two) times daily with a meal. Patient taking differently: Take 1,334 mg by mouth 3 (three) times daily with meals. 12/01/19  Yes Angiulli, Mcarthur Rossetti, PA-C  clopidogrel (PLAVIX) 75 MG tablet Take 75 mg by mouth in the morning. 12/01/19  Yes [provider]  gabapentin (NEURONTIN) 100 MG capsule Take 100-200 mg by mouth See admin instructions. 100 mg in the morning, 200 mg in the evening 11/28/19  Yes [provider]  loperamide (IMODIUM) 2 MG capsule Take 2 mg by mouth as needed for diarrhea or loose stools.   Yes [provider]  multivitamin (RENA-VIT) TABS tablet Take 1 tablet by mouth at bedtime. 12/01/19  Yes Angiulli, Mcarthur Rossetti, PA-C  pantoprazole (PROTONIX) 40 MG tablet Take 40 mg by mouth daily before breakfast.   Yes [provider]   Current Facility-Administered Medications  Medication Dose Route Frequency Provider Last Rate Last Admin   acetaminophen (TYLENOL) tablet 650 mg  650 mg Oral Q6H PRN Tu,  Ching T, DO   650 mg at 01/26/23 2308   aspirin EC tablet 81 mg  81 mg Oral QHS Tu, Ching T, DO   81 mg at 01/26/23 2309   calcium acetate (PHOSLO) capsule 1,334 mg  1,334 mg Oral BID WC Tu, Ching T, DO   1,334 mg at 01/27/23 0749   Chlorhexidine Gluconate Cloth 2 % PADS 6 each  6 each Topical Q0600 Oluwatimileyin Vivier, Lillia Abed, PA       clopidogrel (PLAVIX) tablet 75 mg  75 mg Oral Daily Tu, Ching T, DO       enoxaparin (LOVENOX) injection 40 mg  40 mg Subcutaneous Q24H Tu, Ching T, DO   40 mg at 01/26/23 2308   pantoprazole (PROTONIX) EC tablet 40 mg  40 mg Oral QAC breakfast Tu, Ching T, DO   40 mg at 01/27/23 4098   Current Outpatient Medications  Medication Sig Dispense Refill   atorvastatin (LIPITOR) 40 MG tablet Take 1 tablet (40 mg total) by mouth daily at 6 PM. 30 tablet 5   calcium acetate (PHOSLO) 667 MG capsule Take 2 capsules (1,334 mg total) by mouth 2 (two) times daily with a meal. (Patient taking differently: Take 1,334 mg by mouth 3 (three) times daily with meals.) 180 capsule 0   clopidogrel (PLAVIX) 75 MG tablet Take 75 mg by mouth in the morning.     gabapentin (NEURONTIN) 100 MG capsule Take 100-200 mg by mouth See admin instructions. 100 mg in the morning, 200 mg in the evening     loperamide (IMODIUM) 2 MG capsule Take 2 mg by mouth as needed for diarrhea or loose stools.     multivitamin (RENA-VIT) TABS tablet Take 1 tablet by mouth at bedtime. 30 tablet 0   pantoprazole (PROTONIX) 40 MG tablet Take 40 mg by mouth daily before breakfast.  Labs: Basic Metabolic Panel: Recent Labs  Lab 01/26/23 1535 01/26/23 1551 01/26/23 1615 01/27/23 0500  NA 139 135 134* 135  K 4.3 4.2 3.4* 5.6*  CL 96* 98  --  96*  CO2 21*  --   --  21*  GLUCOSE 110* 103*  --  108*  BUN 91* 96*  --  100*  CREATININE 16.41* >18.00*  --  17.33*  CALCIUM 9.1  --   --  9.9   Liver Function Tests: Recent Labs  Lab 01/26/23 1535  AST 12*  ALT 15  ALKPHOS 59  BILITOT 0.8  PROT 5.6*  ALBUMIN  3.0*   CBC: Recent Labs  Lab 01/26/23 1535 01/26/23 1551 01/26/23 1615 01/27/23 0500  WBC 6.3  --   --  11.7*  NEUTROABS 0.8*  --   --   --   HGB 9.2* 9.5* 8.8* 9.3*  HCT 28.2* 28.0* 26.0* 28.1*  MCV 89.8  --   --  88.4  PLT 195  --   --  160    Studies/Results: CT HEAD WO CONTRAST ( )  Result Date: 01/26/2023 CLINICAL DATA:  Mental status change, unknown cause EXAM: CT HEAD WITHOUT CONTRAST TECHNIQUE: Contiguous axial images were obtained from the base of the skull through the vertex without intravenous contrast. RADIATION DOSE REDUCTION: This exam was performed according to the departmental dose-optimization program which includes automated exposure control, adjustment of the mA and/or kV according to patient size and/or use of iterative reconstruction technique. COMPARISON:  None Available. FINDINGS: Brain: No evidence of acute infarction, hemorrhage, hydrocephalus, extra-axial collection or mass lesion/mass effect. Vascular: No hyperdense vessel identified. Skull: No acute fracture. Sinuses/Orbits: Clear sinuses.  No acute orbital findings. Other: No mastoid effusions. IMPRESSION: No evidence of an acute intracranial abnormality. Electronically Signed   By: Feliberto Harts M.D.   On: 01/26/2023 18:41   IR Fluoro Guide CV Line Left  Result Date: 01/26/2023 INDICATION: diaylsis; malfunctioning hemodialysis access Patient on MWF HD via RIGHT upper extremity fistula, reportedly thrombosed. Last reported HD on Monday. EXAM: TUNNELED CENTRAL VENOUS HEMODIALYSIS CATHETER PLACEMENT WITH ULTRASOUND AND FLUOROSCOPIC GUIDANCE MEDICATIONS: Ancef 2 gm IV . The antibiotic was given in an appropriate time interval prior to skin puncture. ANESTHESIA/SEDATION: Local anesthetic and single agent sedation was employed during this procedure. A total of Versed 1 mg was administered intravenously. The patient's level of consciousness and vital signs were monitored continuously by radiology nursing  throughout the procedure under my direct supervision. FLUOROSCOPY TIME:  Fluoroscopic dose; 2 mGy COMPLICATIONS: SIR LEVEL C - Requires therapy, minor hospitalization (<48 hrs). PROCEDURE: Informed written consent was obtained from the the patient and/or patient's representative after a discussion of the risks, benefits, and alternatives to treatment. Questions regarding the procedure were encouraged and answered. The LEFT neck and chest were prepped with chlorhexidine in a sterile fashion, and a sterile drape was applied covering the operative field. Maximum barrier sterile technique with sterile gowns and gloves were used for the procedure. A timeout was performed prior to the initiation of the procedure. After creating a small venotomy incision, a micropuncture kit was utilized to access the internal jugular vein. Real-time ultrasound guidance was utilized for vascular access including the acquisition of a permanent ultrasound image documenting patency of the accessed vessel. The microwire was utilized to measure appropriate catheter length. A stiff Glidewire was advanced to the level of the IVC and the micropuncture sheath was exchanged for a peel-away sheath. A palindrome tunneled hemodialysis catheter measuring 23  cm from tip to cuff was tunneled in a retrograde fashion from the anterior chest wall to the venotomy incision. The catheter was then placed through the peel-away sheath with tips ultimately positioned within the superior aspect of the right atrium. Final catheter positioning was confirmed and documented with a spot radiographic image. The catheter aspirates and flushes normally. The catheter was flushed with appropriate volume heparin dwells. The catheter exit site was secured with a 0-Prolene retention suture. The venotomy incision was closed with Dermabond. Dressings were applied. The patient experienced dysrhythmia and altered mental status. The decision was made to transfer the patient to the ER  for further evaluation. IMPRESSION: Successful placement of 23 cm tip to cuff tunneled hemodialysis catheter via the LEFT internal jugular vein The tip of the catheter is positioned at the superior cavo-atrial junction. The catheter is ready for immediate use. Roanna Banning, MD [facility] Radiology Electronically Signed   By: Roanna Banning M.D.   On: 01/26/2023 16:08   IR US Guide Vasc Access Left  Result Date: 01/26/2023 INDICATION: diaylsis; malfunctioning hemodialysis access Patient on MWF HD via RIGHT upper extremity fistula, reportedly thrombosed. Last reported HD on Monday. EXAM: TUNNELED CENTRAL VENOUS HEMODIALYSIS CATHETER PLACEMENT WITH ULTRASOUND AND FLUOROSCOPIC GUIDANCE MEDICATIONS: Ancef 2 gm IV . The antibiotic was given in an appropriate time interval prior to skin puncture. ANESTHESIA/SEDATION: Local anesthetic and single agent sedation was employed during this procedure. A total of Versed 1 mg was administered intravenously. The patient's level of consciousness and vital signs were monitored continuously by radiology nursing throughout the procedure under my direct supervision. FLUOROSCOPY TIME:  Fluoroscopic dose; 2 mGy COMPLICATIONS: SIR LEVEL C - Requires therapy, minor hospitalization (<48 hrs). PROCEDURE: Informed written consent was obtained from the the patient and/or patient's representative after a discussion of the risks, benefits, and alternatives to treatment. Questions regarding the procedure were encouraged and answered. The LEFT neck and chest were prepped with chlorhexidine in a sterile fashion, and a sterile drape was applied covering the operative field. Maximum barrier sterile technique with sterile gowns and gloves were used for the procedure. A timeout was performed prior to the initiation of the procedure. After creating a small venotomy incision, a micropuncture kit was utilized to access the internal jugular vein. Real-time  ultrasound guidance was utilized for vascular access including the acquisition of a permanent ultrasound image documenting patency of the accessed vessel. The microwire was utilized to measure appropriate catheter length. A stiff Glidewire was advanced to the level of the IVC and the micropuncture sheath was exchanged for a peel-away sheath. A palindrome tunneled hemodialysis catheter measuring 23 cm from tip to cuff was tunneled in a retrograde fashion from the anterior chest wall to the venotomy incision. The catheter was then placed through the peel-away sheath with tips ultimately positioned within the superior aspect of the right atrium. Final catheter positioning was confirmed and documented with a spot radiographic image. The catheter aspirates and flushes normally. The catheter was flushed with appropriate volume heparin dwells. The catheter exit site was secured with a 0-Prolene retention suture. The venotomy incision was closed with Dermabond. Dressings were applied. The patient experienced dysrhythmia and altered mental status. The decision was made to transfer the patient to the ER for further evaluation. IMPRESSION: Successful placement of 23 cm tip to cuff tunneled hemodialysis catheter via the LEFT internal jugular vein The tip of the catheter is positioned at the superior cavo-atrial junction. The catheter is ready for  immediate use. Roanna Banning, MD [facility] Radiology Electronically Signed   By: Roanna Banning M.D.   On: 01/26/2023 16:08   DG Chest Portable 1 View  Result Date: 01/26/2023 CLINICAL DATA:  AMS EXAM: PORTABLE CHEST 1 VIEW COMPARISON:  Chest XR, 07/22/2022.  CT chest, 07/17/2018. FINDINGS: Support lines: LEFT chest dialysis catheter, with tip at the superior cavoatrial junction. Cardiac silhouette is within normal limits. Aortic arch atherosclerosis. Lungs are hypoinflated with perihilar interstitial thickening and trace layering  fluid along the minor fissure. No focal consolidation or mass. No pleural effusion or pneumothorax. No acute osseous abnormality. IMPRESSION: 1. LEFT chest dialysis catheter, well-positioned. 2. Interstitial thickening suspicious for early versus developing pulmonary edema. Electronically Signed   By: Roanna Banning M.D.   On: 01/26/2023 16:01    ROS: All others negative except those listed in HPI.  Physical Exam: Vitals:   01/27/23 0637 01/27/23 0658 01/27/23 0700 01/27/23 0744  BP: (!) 156/96  (!) 160/90 (!) 157/94  Pulse: 77  78 75  Resp: 18  11 15   Temp:  98.2 F (36.8 C)  97.9 F (36.6 C)  TempSrc:  Oral  Oral  SpO2: 100%  98% 98%     General: WDWN male NAD Head: NCAT sclera not icteric MMM Neck: Supple. No lymphadenopathy Lungs: scattered wheezing. Breathing is unlabored on RA. Heart: RRR. No murmur, rubs or gallops.  Abdomen: soft, nontender, +BS, no guarding, no rebound tenderness Lower extremities:1+ edema b/l Neuro: AAOx3. Moves all extremities spontaneously. Psych:  Responds to questions appropriately with a normal affect. Dialysis Access: TDC, RU AVF no b/t  Dialysis Orders:  MWF - [facility]  4hrs, BFR 450, DFR AF 1.5,  EDW 75.1kg, 2K/ 2Ca  Access: RU AVF clotted, TDC in place  Heparin none Calcitriol PO qHD  Assessment/Plan:  AMS/unresponsiveness- post procedure. Likely 2/2 uremia +sedating meds. Back to baseline today.  Clotted HD Access - TDC placed by IR. Appreciate their assistance. Will need to go back to [facility] as outpatient to reassess new access vs declot.   ESRD -  Plan for HD today off schedule.  K 5.6. Next HD 10/21. Reminded of importance of compliance with HD.  Hypertension/volume  - Initially hypotensive, BP now elevated.  CXR w/early pulmonary edema, LE edema on exam.  UF 3L as tolerated. May need increase in dry weight as he has not been meeting it recently.   Anemia of CKD - Hgb 9.3. Likely need to restart ESA.   Secondary Hyperparathyroidism -  Ca in  goal. Check phos.   Nutrition - Renal diet w/fluid restrictions.   Virgina Norfolk, PA-C [facility] 01/27/2023, 9:31 AM

## 2023-01-27 NOTE — Procedures (Signed)
Patient was seen on dialysis and the procedure was supervised.  BFR 400  Via TDC BP is  156/87.   Patient appears to be tolerating treatment well.  Woodroe Vogan Jaynie Collins 01/27/2023

## 2023-01-27 NOTE — ED Notes (Signed)
PT REPORTS HE WILL BE GOING HOME BY DAUGHTER

## 2023-01-27 NOTE — Discharge Summary (Signed)
Physician Discharge Summary  Jon Anderson:[id number] DOB: [birth year] DOA: 01/26/2023  PCP: Ralene Ok, MD  Admit date: 01/26/2023 Discharge date: 01/27/2023  Admitted From: Home Disposition:  Home  Discharge Condition:Stable CODE STATUS:FULL Diet recommendation: renal  Brief/Interim Summary: Patient is a 72 y.o. male with medical history significant of ESRD, chronic diastolic heart failure, hypertension, CAD, PVD, type 2 diabetes, history of cardiac arrest likely seconary to contrast-related anaphylaxis who presents with unresponsiveness.  Patient underwent image-guided tunneled dialysis catheter placement with IR outpatient today and brought into ED from short stay. Had received 1mg  of Versed and became more unresponsive and hypotensive to SPB 60s. Also reportedly had short run of nonsustained V. Tach prior to procedure. His last dialysis session was Monday.He was empirically give Calcium in ED due concerns for hyperkalemia with ESRD. However EKG was reassuring without T wave changes. Narcan was given without relief. Phenylephrine push dose given for hypotension with steady improvement in blood pressure and mental status. CT head was negative for acute process. CXR shows early pulmonary edema.  Patient was placed under observation.  Nephrology consulted. Patient seen and examined at the bedside this morning.  He appears very comfortable.  Hemodynamically stable.  Blood pressure is stable.  Plan for discharge to home today after dialysis.   Following problems were addressed during the hospitalization:  Acute metabolic encephalopathy -likely secondary to Versed administered during procedure resulting in hypotension in the setting of uremia, worsening renal function from missed dialysis -mental status improved following improvement in BP from Phenylephrine push .  Currently alert and oriented.y   Hypotension -presenting SBP in the 60s suspect secondary to Versed and missed  dialysis. Received Phenylephrine push in ED. BP now normalized.  -no longer on midodrine at home   Type II diabetes mellitus (HCC) -7.6 A1C about a year ago -Continue home regimen on discharge   ESRD (end stage renal disease) on dialysis (HCC) -last dialysis on Monday. Creatinine up to 16 from 7 -Undergoing dialysis today -POD 0 of  left chest tunneled dialysis catheter placement with IR    PVD (peripheral vascular disease) (HCC) -On plavix    CAD (coronary artery disease) nonobstructive per cath 2012 -on  plavix for PAD   Discharge Diagnoses:  Principal Problem:   Acute metabolic encephalopathy Active Problems:   CAD (coronary artery disease) nonobstructive per cath 2012   PVD (peripheral vascular disease) (HCC)   ESRD (end stage renal disease) on dialysis (HCC)   Type II diabetes mellitus (HCC)   Hypotension    Discharge Instructions  Discharge Instructions     Diet - low sodium heart healthy   Complete by: As directed    Discharge instructions   Complete by: As directed    1)Please follow up with your PCP in a week 2)Continue dialysis as outpatient   Increase activity slowly   Complete by: As directed    No wound care   Complete by: As directed       Allergies as of 01/27/2023       Reactions   Contrast Media [iodinated Contrast Media] Anaphylaxis   Cardiac arrest, Throat swelling, hives, SOB   Kiwi Extract Itching, Swelling, Other (See Comments)   Lips and face swell- breathing not affected   Flexeril [cyclobenzaprine]    Hands become flimsy, can not hold things   Tape Other (See Comments)   "Plastic" tape causes blisters!!        Medication List     TAKE these medications  atorvastatin 40 MG tablet Commonly known as: LIPITOR Take 1 tablet (40 mg total) by mouth daily at 6 PM.   calcium acetate 667 MG capsule Commonly known as: PHOSLO Take 2 capsules (1,334 mg total) by mouth 2 (two) times daily with a meal. What changed: when to take  this   clopidogrel 75 MG tablet Commonly known as: PLAVIX Take 75 mg by mouth in the morning.   gabapentin 100 MG capsule Commonly known as: NEURONTIN Take 100-200 mg by mouth See admin instructions. 100 mg in the morning, 200 mg in the evening   loperamide 2 MG capsule Commonly known as: IMODIUM Take 2 mg by mouth as needed for diarrhea or loose stools.   multivitamin Tabs tablet Take 1 tablet by mouth at bedtime.   pantoprazole 40 MG tablet Commonly known as: PROTONIX Take 40 mg by mouth daily before breakfast.        Follow-up Information     Ralene Ok, MD. Schedule an appointment as soon as possible for a visit in 1 week(s).   Specialty: Internal Medicine Contact information: [address] [place] [address] [phone]                Allergies  Allergen Reactions   Contrast Media [Iodinated Contrast Media] Anaphylaxis    Cardiac arrest, Throat swelling, hives, SOB   Kiwi Extract Itching, Swelling and Other (See Comments)    Lips and face swell- breathing not affected   Flexeril [Cyclobenzaprine]     Hands become flimsy, can not hold things   Tape Other (See Comments)    "Plastic" tape causes blisters!!    Consultations: Nephrology   Procedures/Studies: CT HEAD WO CONTRAST ( )  Result Date: 01/26/2023 CLINICAL DATA:  Mental status change, unknown cause EXAM: CT HEAD WITHOUT CONTRAST TECHNIQUE: Contiguous axial images were obtained from the base of the skull through the vertex without intravenous contrast. RADIATION DOSE REDUCTION: This exam was performed according to the departmental dose-optimization program which includes automated exposure control, adjustment of the mA and/or kV according to patient size and/or use of iterative reconstruction technique. COMPARISON:  None Available. FINDINGS: Brain: No evidence of acute infarction, hemorrhage, hydrocephalus, extra-axial collection or mass lesion/mass effect. Vascular: No hyperdense vessel  identified. Skull: No acute fracture. Sinuses/Orbits: Clear sinuses.  No acute orbital findings. Other: No mastoid effusions. IMPRESSION: No evidence of an acute intracranial abnormality. Electronically Signed   By: Feliberto Harts M.D.   On: 01/26/2023 18:41   IR Fluoro Guide CV Line Left  Result Date: 01/26/2023 INDICATION: diaylsis; malfunctioning hemodialysis access Patient on MWF HD via RIGHT upper extremity fistula, reportedly thrombosed. Last reported HD on Monday. EXAM: TUNNELED CENTRAL VENOUS HEMODIALYSIS CATHETER PLACEMENT WITH ULTRASOUND AND FLUOROSCOPIC GUIDANCE MEDICATIONS: Ancef 2 gm IV . The antibiotic was given in an appropriate time interval prior to skin puncture. ANESTHESIA/SEDATION: Local anesthetic and single agent sedation was employed during this procedure. A total of Versed 1 mg was administered intravenously. The patient's level of consciousness and vital signs were monitored continuously by radiology nursing throughout the procedure under my direct supervision. FLUOROSCOPY TIME:  Fluoroscopic dose; 2 mGy COMPLICATIONS: SIR LEVEL C - Requires therapy, minor hospitalization (<48 hrs). PROCEDURE: Informed written consent was obtained from the the patient and/or patient's representative after a discussion of the risks, benefits, and alternatives to treatment. Questions regarding the procedure were encouraged and answered. The LEFT neck and chest were prepped with chlorhexidine in a sterile fashion, and a sterile drape was applied covering the  operative field. Maximum barrier sterile technique with sterile gowns and gloves were used for the procedure. A timeout was performed prior to the initiation of the procedure. After creating a small venotomy incision, a micropuncture kit was utilized to access the internal jugular vein. Real-time ultrasound guidance was utilized for vascular access including the acquisition of a permanent ultrasound image documenting patency of the accessed vessel.  The microwire was utilized to measure appropriate catheter length. A stiff Glidewire was advanced to the level of the IVC and the micropuncture sheath was exchanged for a peel-away sheath. A palindrome tunneled hemodialysis catheter measuring 23 cm from tip to cuff was tunneled in a retrograde fashion from the anterior chest wall to the venotomy incision. The catheter was then placed through the peel-away sheath with tips ultimately positioned within the superior aspect of the right atrium. Final catheter positioning was confirmed and documented with a spot radiographic image. The catheter aspirates and flushes normally. The catheter was flushed with appropriate volume heparin dwells. The catheter exit site was secured with a 0-Prolene retention suture. The venotomy incision was closed with Dermabond. Dressings were applied. The patient experienced dysrhythmia and altered mental status. The decision was made to transfer the patient to the ER for further evaluation. IMPRESSION: Successful placement of 23 cm tip to cuff tunneled hemodialysis catheter via the LEFT internal jugular vein The tip of the catheter is positioned at the superior cavo-atrial junction. The catheter is ready for immediate use. Roanna Banning, MD [facility] Electronically Signed   By: Roanna Banning M.D.   On: 01/26/2023 16:08   IR US Guide Vasc Access Left  Result Date: 01/26/2023 INDICATION: diaylsis; malfunctioning hemodialysis access Patient on MWF HD via RIGHT upper extremity fistula, reportedly thrombosed. Last reported HD on Monday. EXAM: TUNNELED CENTRAL VENOUS HEMODIALYSIS CATHETER PLACEMENT WITH ULTRASOUND AND FLUOROSCOPIC GUIDANCE MEDICATIONS: Ancef 2 gm IV . The antibiotic was given in an appropriate time interval prior to skin puncture. ANESTHESIA/SEDATION: Local anesthetic and single agent sedation was employed during this procedure. A total of Versed 1 mg was administered  intravenously. The patient's level of consciousness and vital signs were monitored continuously by radiology nursing throughout the procedure under my direct supervision. FLUOROSCOPY TIME:  Fluoroscopic dose; 2 mGy COMPLICATIONS: SIR LEVEL C - Requires therapy, minor hospitalization (<48 hrs). PROCEDURE: Informed written consent was obtained from the the patient and/or patient's representative after a discussion of the risks, benefits, and alternatives to treatment. Questions regarding the procedure were encouraged and answered. The LEFT neck and chest were prepped with chlorhexidine in a sterile fashion, and a sterile drape was applied covering the operative field. Maximum barrier sterile technique with sterile gowns and gloves were used for the procedure. A timeout was performed prior to the initiation of the procedure. After creating a small venotomy incision, a micropuncture kit was utilized to access the internal jugular vein. Real-time ultrasound guidance was utilized for vascular access including the acquisition of a permanent ultrasound image documenting patency of the accessed vessel. The microwire was utilized to measure appropriate catheter length. A stiff Glidewire was advanced to the level of the IVC and the micropuncture sheath was exchanged for a peel-away sheath. A palindrome tunneled hemodialysis catheter measuring 23 cm from tip to cuff was tunneled in a retrograde fashion from the anterior chest wall to the venotomy incision. The catheter was then placed through the peel-away sheath with tips ultimately positioned within the superior aspect of the right atrium. Final catheter  positioning was confirmed and documented with a spot radiographic image. The catheter aspirates and flushes normally. The catheter was flushed with appropriate volume heparin dwells. The catheter exit site was secured with a 0-Prolene retention suture. The venotomy incision was closed with Dermabond. Dressings were applied.  The patient experienced dysrhythmia and altered mental status. The decision was made to transfer the patient to the ER for further evaluation. IMPRESSION: Successful placement of 23 cm tip to cuff tunneled hemodialysis catheter via the LEFT internal jugular vein The tip of the catheter is positioned at the superior cavo-atrial junction. The catheter is ready for immediate use. Roanna Banning, MD [facility] Electronically Signed   By: Roanna Banning M.D.   On: 01/26/2023 16:08   DG Chest Portable 1 View  Result Date: 01/26/2023 CLINICAL DATA:  AMS EXAM: PORTABLE CHEST 1 VIEW COMPARISON:  Chest XR, 07/22/2022.  CT chest, 07/17/2018. FINDINGS: Support lines: LEFT chest dialysis catheter, with tip at the superior cavoatrial junction. Cardiac silhouette is within normal limits. Aortic arch atherosclerosis. Lungs are hypoinflated with perihilar interstitial thickening and trace layering fluid along the minor fissure. No focal consolidation or mass. No pleural effusion or pneumothorax. No acute osseous abnormality. IMPRESSION: 1. LEFT chest dialysis catheter, well-positioned. 2. Interstitial thickening suspicious for early versus developing pulmonary edema. Electronically Signed   By: Roanna Banning M.D.   On: 01/26/2023 16:01      Subjective: Seen and examined at bedside today.  Hemodynamically stable.  Blood pressure significantly better.  He is alert and oriented.  He does not have any complaints today except for some discomfort on the area of dialysis catheter placement on the left chest.  We discussed about discharge planning to home after dialysis.  I called and discussed with his wife Corrie Dandy on phone about discharge planning  Discharge Exam: Vitals:   01/27/23 1127 01/27/23 1136  BP: (!) 154/91 (!) 159/88  Pulse: 72 71  Resp: 18 16  Temp: 98 F (36.7 C)   SpO2: 96% 97%   Vitals:   01/27/23 0900 01/27/23 0930 01/27/23 1127 01/27/23 1136  BP:  (!) 148/93 (!) 156/87 (!) 154/91 (!) 159/88  Pulse: 77 76 72 71  Resp: 15 15 18 16   Temp:   98 F (36.7 C)   TempSrc:      SpO2: 98% 99% 96% 97%    General: Pt is alert, awake, not in acute distress Cardiovascular: RRR, S1/S2 +, no rubs, no gallops Respiratory: CTA bilaterally, no wheezing, no rhonchi, dialysis catheter on the left chest Abdominal: Soft, NT, ND, bowel sounds + Extremities: Trace bilateral lower extremity pitting edema, no cyanosis    The results of significant diagnostics from this hospitalization (including imaging, microbiology, ancillary and laboratory) are listed below for reference.     Microbiology: Recent Results (from the past 240 hour(s))  Culture, blood (routine x 2)     Status: None (Preliminary result)   Collection Time: 01/26/23  3:55 PM   Specimen: BLOOD LEFT FOREARM  Result Value Ref Range Status   Specimen Description BLOOD LEFT FOREARM  Final   Special Requests   Final    BOTTLES DRAWN AEROBIC AND ANAEROBIC Blood Culture results may not be optimal due to an excessive volume of blood received in culture bottles   Culture   Final    NO GROWTH < 24 HOURS Performed at [facility] Lab, [address] [place], [place] [address]    Report Status PENDING  Incomplete  Culture, blood (routine x 2)     Status: None (Preliminary result)   Collection Time: 01/26/23  4:00 PM   Specimen: BLOOD LEFT FOREARM  Result Value Ref Range Status   Specimen Description BLOOD LEFT FOREARM  Final   Special Requests   Final    BOTTLES DRAWN AEROBIC AND ANAEROBIC Blood Culture adequate volume   Culture   Final    NO GROWTH < 24 HOURS Performed at [facility], [address], [place], [place] [address]    Report Status PENDING  Incomplete     Labs: BNP (last 3 results) No results for input(s): "BNP" in the last 8760 hours. Basic Metabolic Panel: Recent Labs  Lab 01/26/23 1535 01/26/23 1551 01/26/23 1615 01/27/23 0500  NA 139 135 134* 135  K  4.3 4.2 3.4* 5.6*  CL 96* 98  --  96*  CO2 21*  --   --  21*  GLUCOSE 110* 103*  --  108*  BUN 91* 96*  --  100*  CREATININE 16.41* >18.00*  --  17.33*  CALCIUM 9.1  --   --  9.9  MG 1.9  --   --   --    Liver Function Tests: Recent Labs  Lab 01/26/23 1535  AST 12*  ALT 15  ALKPHOS 59  BILITOT 0.8  PROT 5.6*  ALBUMIN 3.0*   No results for input(s): "LIPASE", "AMYLASE" in the last 168 hours. No results for input(s): "AMMONIA" in the last 168 hours. CBC: Recent Labs  Lab 01/26/23 1535 01/26/23 1551 01/26/23 1615 01/27/23 0500  WBC 6.3  --   --  11.7*  NEUTROABS 0.8*  --   --   --   HGB 9.2* 9.5* 8.8* 9.3*  HCT 28.2* 28.0* 26.0* 28.1*  MCV 89.8  --   --  88.4  PLT 195  --   --  160   Cardiac Enzymes: No results for input(s): "CKTOTAL", "CKMB", "CKMBINDEX", "TROPONINI" in the last 168 hours. BNP: Invalid input(s): "POCBNP" CBG: Recent Labs  Lab 01/26/23 1310  GLUCAP 82   D-Dimer No results for input(s): "DDIMER" in the last 72 hours. Hgb A1c No results for input(s): "HGBA1C" in the last 72 hours. Lipid Profile No results for input(s): "CHOL", "HDL", "LDLCALC", "TRIG", "CHOLHDL", "LDLDIRECT" in the last 72 hours. Thyroid function studies No results for input(s): "TSH", "T4TOTAL", "T3FREE", "THYROIDAB" in the last 72 hours.  Invalid input(s): "FREET3" Anemia work up No results for input(s): "VITAMINB12", "FOLATE", "FERRITIN", "TIBC", "IRON", "RETICCTPCT" in the last 72 hours. Urinalysis    Component Value Date/Time   COLORURINE YELLOW 03/18/2017 1721   APPEARANCEUR CLEAR 03/18/2017 1721   LABSPEC 1.007 03/18/2017 1721   PHURINE 9.0 (H) 03/18/2017 1721   GLUCOSEU 150 (A) 03/18/2017 1721   HGBUR NEGATIVE 03/18/2017 1721   BILIRUBINUR NEGATIVE 03/18/2017 1721   KETONESUR NEGATIVE 03/18/2017 1721   PROTEINUR >=300 (A) 03/18/2017 1721   UROBILINOGEN 0.2 06/18/2014 2308   NITRITE NEGATIVE 03/18/2017 1721   LEUKOCYTESUR NEGATIVE 03/18/2017 1721   Sepsis  Labs Recent Labs  Lab 01/26/23 1535 01/27/23 0500  WBC 6.3 11.7*   Microbiology Recent Results (from the past 240 hour(s))  Culture, blood (routine x 2)     Status: None (Preliminary result)   Collection Time: 01/26/23  3:55 PM   Specimen: BLOOD LEFT FOREARM  Result Value Ref Range Status   Specimen Description BLOOD LEFT FOREARM  Final   Special Requests   Final  BOTTLES DRAWN AEROBIC AND ANAEROBIC Blood Culture results may not be optimal due to an excessive volume of blood received in culture bottles   Culture   Final    NO GROWTH < 24 HOURS Performed at [facility], [address] [place], [place] [address]    Report Status PENDING  Incomplete  Culture, blood (routine x 2)     Status: None (Preliminary result)   Collection Time: 01/26/23  4:00 PM   Specimen: BLOOD LEFT FOREARM  Result Value Ref Range Status   Specimen Description BLOOD LEFT FOREARM  Final   Special Requests   Final    BOTTLES DRAWN AEROBIC AND ANAEROBIC Blood Culture adequate volume   Culture   Final    NO GROWTH < 24 HOURS Performed at [facility], [address] [place], [place] [address]    Report Status PENDING  Incomplete    Please note: You were cared for by a hospitalist during your hospital stay. Once you are discharged, your primary care physician will handle any further medical issues. Please note that NO REFILLS for any discharge medications will be authorized once you are discharged, as it is imperative that you return to your primary care physician (or establish a relationship with a primary care physician if you do not have one) for your post hospital discharge needs so that they can reassess your need for medications and monitor your lab values.    Time coordinating discharge: 40 minutes  SIGNED:   Burnadette Pop, MD  [facility] 01/27/2023, 11:47 AM Pager [phone]  If 7PM-7AM, please contact night-coverage [email or web address] Password [email or web address]

## 2023-01-30 LAB — HEPATITIS B SURFACE ANTIBODY, QUANTITATIVE: Hep B S AB Quant (Post): 268 m[IU]/mL

## 2023-01-31 LAB — CULTURE, BLOOD (ROUTINE X 2)
Culture: NO GROWTH
Culture: NO GROWTH
Special Requests: ADEQUATE

## 2023-02-06 ENCOUNTER — Other Ambulatory Visit (HOSPITAL_COMMUNITY): Payer: Self-pay | Admitting: Nephrology

## 2023-02-06 ENCOUNTER — Telehealth: Payer: Self-pay | Admitting: Student

## 2023-02-06 DIAGNOSIS — N186 End stage renal disease: Secondary | ICD-10-CM

## 2023-02-06 MED ORDER — DIPHENHYDRAMINE HCL 50 MG PO TABS: ORAL_TABLET | ORAL | 0 refills | Status: DC

## 2023-02-06 MED ORDER — PREDNISONE 50 MG PO TABS: ORAL_TABLET | ORAL | 0 refills | Status: DC

## 2023-02-06 NOTE — Telephone Encounter (Signed)
Attempted to call patient regarding pre-medication regimen for contrast allergy prior to scheduled procedure at 1400 on 10/31. Pre-medication regimen consisting of 50 mg prednisone at 0100 on 10/31, 50 mg of prednisone at 0700 on 10/31, and 50 mg of prednisone and 50 mg of diphenhydramine at 1300 on 10/31 sent to patient's preferred pharmacy in chart. Left message for patient to call back for detailed pre-procedural instructions. Kennieth Francois, PA-C 02/06/2023

## 2023-02-07 ENCOUNTER — Other Ambulatory Visit: Payer: Self-pay | Admitting: Radiology

## 2023-02-08 ENCOUNTER — Ambulatory Visit (HOSPITAL_COMMUNITY): Admission: RE | Admit: 2023-02-08 | Payer: Medicare Other | Source: Ambulatory Visit

## 2023-02-08 ENCOUNTER — Telehealth (HOSPITAL_COMMUNITY): Payer: Self-pay

## 2023-02-08 ENCOUNTER — Encounter (HOSPITAL_COMMUNITY): Payer: Self-pay

## 2023-02-08 NOTE — Telephone Encounter (Signed)
Pt's declot was canceled for today. He will be rescheduled with vascular surgery. Dr. Marisue Humble is aware. AB

## 2023-02-26 NOTE — Addendum Note (Signed)
Encounter addended by: Edward Qualia on: 02/26/2023 11:56 AM  Actions taken: Imaging Exam ended

## 2023-03-14 ENCOUNTER — Ambulatory Visit (INDEPENDENT_AMBULATORY_CARE_PROVIDER_SITE_OTHER): Payer: Medicare Other | Admitting: Podiatry

## 2023-03-14 ENCOUNTER — Encounter: Payer: Self-pay | Admitting: Podiatry

## 2023-03-14 DIAGNOSIS — E1151 Type 2 diabetes mellitus with diabetic peripheral angiopathy without gangrene: Secondary | ICD-10-CM | POA: Diagnosis not present

## 2023-03-14 DIAGNOSIS — B351 Tinea unguium: Secondary | ICD-10-CM | POA: Diagnosis not present

## 2023-03-14 DIAGNOSIS — L84 Corns and callosities: Secondary | ICD-10-CM | POA: Diagnosis not present

## 2023-03-14 DIAGNOSIS — M79674 Pain in right toe(s): Secondary | ICD-10-CM

## 2023-03-14 DIAGNOSIS — M79675 Pain in left toe(s): Secondary | ICD-10-CM

## 2023-03-14 NOTE — Progress Notes (Signed)
Subjective:  Patient ID: Jon Anderson, male    DOB: 1950-10-26,   MRN: [id number]  Chief Complaint  Patient presents with   Nail Problem    DFC    71 y.o. male presents for concern of thickened elongated and painful nails that are difficult to trim. Requesting to have them trimmed today. Relates burning and tingling in their feet. Patient is diabetic and last A1c was  Lab Results  Component Value Date   HGBA1C 7.6 (H) 11/15/2021   .   PCP:  Ralene Ok, MD    . Denies any other pedal complaints. Denies n/v/f/c.   Past Medical History:  Diagnosis Date   Anemia of chronic disease    Arthritis    CAD (coronary artery disease)    Carotid stenosis    a. <50% RICA, >70% LICA by duplex 07/2015   Chronic chest pain    occ   Constipation    chronic   Dyspnea    occasional with extertion   ESRD (end stage renal disease) on dialysis (HCC)    M-W-F- [name]   GERD (gastroesophageal reflux disease)    History of hiatal hernia    History of kidney stones    Passed   HNP (herniated nucleus pulposus), lumbar    HTN (hypertension)    echo 3/10: EF 60%, LAE   Hyperlipidemia    Mitral stenosis    mild MS 11/15/21   Nephrolithiasis    "passed them all"   PEA (Pulseless electrical activity) (HCC) 11/15/2021   PEA arrest felt likely related to contrast anaphylaxis despite premedication   Peripheral arterial disease (HCC)    a. s/p PTCA  right    Pneumonia yrs ago   Restless legs    Sciatic leg pain    Sleep apnea    does not use cpap   Snores    a. presumed OSA, pt has refused sleep eval in past.   Type II diabetes mellitus (HCC)    no longer on medications, checks blood glucose at home   Urinary frequency    Uses wheelchair    Walker as ambulation aid     Objective:  Physical Exam: Vascular: DP/PT pulses 2/4 bilateral. CFT <3 seconds. Absent hair growth on digits. Edema noted to bilateral lower extremities. Xerosis noted bilaterally.  Skin. No lacerations or  abrasions bilateral feet. Nails 1-5 left are thickened discolored and elongated with subungual debris. Hyperkeraottic lesions sub fifth metatarsal head on left and sub fourht metatarsal  on right  Musculoskeletal: MMT 5/5 bilateral lower extremities in DF, PF, Inversion and Eversion. Deceased ROM in DF of ankle joint. TMA on the right foot.  Neurological: Sensation intact to light touch. Protective sensation diminished bilateral.     Assessment:   1. Pain due to onychomycosis of toenails of both feet   2. Pre-ulcerative calluses   3. Type II diabetes mellitus with peripheral circulatory disorder (HCC)      Plan:  Patient was evaluated and treated and all questions answered. -Discussed and educated patient on diabetic foot care, especially with  regards to the vascular, neurological and musculoskeletal systems.  -Stressed the importance of good glycemic control and the detriment of not  controlling glucose levels in relation to the foot. -Discussed supportive shoes at all times and checking feet regularly.  -Mechanically debrided all nails 1-5 bilateral using sterile nail nipper and filed with dremel without incident  -Hyperkeratotic tissue debrided without incident with chisel and burr x 2  -  Answered all patient questions -Patient to return  in 9 weeks for rfc.  -Patient advised to call the office if any problems or questions arise in the meantime.   Louann Sjogren, DPM

## 2023-03-28 ENCOUNTER — Other Ambulatory Visit: Payer: Self-pay

## 2023-03-28 ENCOUNTER — Emergency Department (HOSPITAL_COMMUNITY)
Admission: EM | Admit: 2023-03-28 | Discharge: 2023-03-28 | Disposition: A | Discharge status: Home / Self Care | Payer: Medicare Other | Attending: Emergency Medicine | Admitting: Emergency Medicine

## 2023-03-28 ENCOUNTER — Encounter (HOSPITAL_COMMUNITY): Payer: Self-pay | Admitting: Emergency Medicine

## 2023-03-28 ENCOUNTER — Emergency Department (HOSPITAL_COMMUNITY): Payer: Medicare Other

## 2023-03-28 DIAGNOSIS — Z7902 Long term (current) use of antithrombotics/antiplatelets: Secondary | ICD-10-CM | POA: Insufficient documentation

## 2023-03-28 DIAGNOSIS — R079 Chest pain, unspecified: Secondary | ICD-10-CM

## 2023-03-28 DIAGNOSIS — E1122 Type 2 diabetes mellitus with diabetic chronic kidney disease: Secondary | ICD-10-CM | POA: Insufficient documentation

## 2023-03-28 DIAGNOSIS — R0789 Other chest pain: Secondary | ICD-10-CM | POA: Diagnosis present

## 2023-03-28 DIAGNOSIS — Z7982 Long term (current) use of aspirin: Secondary | ICD-10-CM | POA: Insufficient documentation

## 2023-03-28 DIAGNOSIS — I2510 Atherosclerotic heart disease of native coronary artery without angina pectoris: Secondary | ICD-10-CM | POA: Diagnosis not present

## 2023-03-28 DIAGNOSIS — G8929 Other chronic pain: Secondary | ICD-10-CM | POA: Insufficient documentation

## 2023-03-28 DIAGNOSIS — Z951 Presence of aortocoronary bypass graft: Secondary | ICD-10-CM | POA: Insufficient documentation

## 2023-03-28 DIAGNOSIS — N186 End stage renal disease: Secondary | ICD-10-CM | POA: Diagnosis not present

## 2023-03-28 DIAGNOSIS — Z992 Dependence on renal dialysis: Secondary | ICD-10-CM | POA: Diagnosis not present

## 2023-03-28 DIAGNOSIS — Z79899 Other long term (current) drug therapy: Secondary | ICD-10-CM | POA: Insufficient documentation

## 2023-03-28 DIAGNOSIS — I120 Hypertensive chronic kidney disease with stage 5 chronic kidney disease or end stage renal disease: Secondary | ICD-10-CM | POA: Insufficient documentation

## 2023-03-28 LAB — CBC
HCT: 41.2 % (ref 39.0–52.0)
Hemoglobin: 13.1 g/dL (ref 13.0–17.0)
MCH: 30.4 pg (ref 26.0–34.0)
MCHC: 31.8 g/dL (ref 30.0–36.0)
MCV: 95.6 fL (ref 80.0–100.0)
Platelets: 208 10*3/uL (ref 150–400)
RBC: 4.31 MIL/uL (ref 4.22–5.81)
RDW: 13.8 % (ref 11.5–15.5)
WBC: 4.2 10*3/uL (ref 4.0–10.5)
nRBC: 0 % (ref 0.0–0.2)

## 2023-03-28 LAB — BASIC METABOLIC PANEL
Anion gap: 14 (ref 5–15)
BUN: 16 mg/dL (ref 8–23)
CO2: 22 mmol/L (ref 22–32)
Calcium: 9.4 mg/dL (ref 8.9–10.3)
Chloride: 99 mmol/L (ref 98–111)
Creatinine, Ser: 5.87 mg/dL — ABNORMAL HIGH (ref 0.61–1.24)
GFR, Estimated: 10 mL/min — ABNORMAL LOW (ref >60)
Glucose, Bld: 84 mg/dL (ref 70–99)
Potassium: 4 mmol/L (ref 3.5–5.1)
Sodium: 135 mmol/L (ref 135–145)

## 2023-03-28 LAB — TROPONIN I (HIGH SENSITIVITY)
Troponin I (High Sensitivity): 31 ng/L — ABNORMAL HIGH (ref <18)
Troponin I (High Sensitivity): 25 ng/L — ABNORMAL HIGH (ref <18)

## 2023-03-28 NOTE — ED Triage Notes (Signed)
Pt here from Dialysis with c/o ches pain some relief with 1 nitroglycerin , aslo had 324mg asa , received 2.5 hrs of dialysis

## 2023-03-28 NOTE — ED Provider Triage Note (Signed)
Emergency Medicine Provider Triage Evaluation Note  Jon Anderson , a 72 y.o. male  was evaluated in triage.  Pt complains of chest pain shortness of breath during dialysis this morning.  Underwent 2.5 hours of hemodialysis as scheduled prior to developing chest pain shortness of breath.  Received nitro and 324 aspirin before arrival in the ED.  Reports resolution in chest pain shortness of breath at this time  Review of Systems  Positive: Chest pain shortness of breath Negative: Nausea vomiting diaphoresis abdominal pain fevers chills  Physical Exam  BP 108/68 (BP Location: Right Arm)   Pulse 76   Temp 97.8 F (36.6 C) (Oral)   Resp 17   SpO2 100%  Gen:   Awake, no distress   Resp:  Normal effort  MSK:   Moves extremities without difficulty  Other:    Medical Decision Making  Medically screening exam initiated at 11:53 AM.  Appropriate orders placed.  Nat Math was informed that the remainder of the evaluation will be completed by another provider, this initial triage assessment does not replace that evaluation, and the importance of remaining in the ED until their evaluation is complete.     Royanne Foots, DO 03/28/23 1154

## 2023-03-28 NOTE — ED Provider Notes (Signed)
[place] EMERGENCY DEPARTMENT AT [facility] ([facility]) Provider Note   CSN: [id number] Arrival date & time: 03/28/23  1052     History  Chief Complaint  Patient presents with   Chest Pain    Jon Anderson is a 72 y.o. male w/ ESRD on dialysis presenting from dialysis with concern for chest pain.  Patient ports began having discomfort in the left side of his chest while at dialysis, EMS was called given with 24 mg of aspirin and 1 nitroglycerin, he reports improvement of his pain.  It is now quite minimal but has not gone away completely.  He reports he has had similar chest pains the past, sometimes during dialysis, sometimes at home, but not on a frequent basis, not as intense as today.  He does report a history of coronary disease and CABG or cardiac stent.  He denies missing any dialysis  I reviewed his external records.  His last office visit with cardiology heart care was in 12/27/21 with Bernadene Person NP.  He was noted have history of coronary disease status post CABG, peripheral serial disease, type 2 diabetes, carotid artery disease, hyperlipidemia, hypertension.  He is also noted to have chronic chest pain.  He is also noted have a history of cardiac arrest which reportedly occurred in the setting of anaphylaxis due to contrast allergy despite medication.  HPI     Home Medications Prior to Admission medications   Medication Sig Start Date End Date Taking? Authorizing Provider  calcium acetate (PHOSLO) 667 MG capsule Take 2 capsules (1,334 mg total) by mouth 2 (two) times daily with a meal. Patient taking differently: Take 1,334 mg by mouth in the morning and at bedtime. 12/01/19  Yes Angiulli, Mcarthur Rossetti, PA-C  clopidogrel (PLAVIX) 75 MG tablet Take 75 mg by mouth in the morning. 12/01/19  Yes [provider]  gabapentin (NEURONTIN) 100 MG capsule Take 100-200 mg by mouth See admin instructions. 100 mg in the morning, 200 mg in the evening 11/28/19  Yes [provider]  multivitamin (RENA-VIT) TABS tablet Take 1 tablet by mouth at bedtime. 12/01/19  Yes Angiulli, Mcarthur Rossetti, PA-C  pantoprazole (PROTONIX) 40 MG tablet Take 40 mg by mouth daily before breakfast.   Yes [provider]      Allergies    Contrast media [iodinated contrast media], Kiwi extract, Flexeril [cyclobenzaprine], and Tape    Review of Systems   Review of Systems  Physical Exam Updated Vital Signs BP (!) 158/89   Pulse 67   Temp 97.8 F (36.6 C) (Oral)   Resp (!) 21   Ht 5\' 9"  (1.753 m)   Wt 78 kg   SpO2 100%   BMI 25.40 kg/m  Physical Exam Constitutional:      General: He is not in acute distress. HENT:     Head: Normocephalic and atraumatic.  Eyes:     Conjunctiva/sclera: Conjunctivae normal.     Pupils: Pupils are equal, round, and reactive to light.  Cardiovascular:     Rate and Rhythm: Normal rate and regular rhythm.  Pulmonary:     Effort: Pulmonary effort is normal. No respiratory distress.  Chest:     Comments: Left-sided chest wall port Abdominal:     General: There is no distension.     Tenderness: There is no abdominal tenderness.  Skin:    General: Skin is warm and dry.  Neurological:     General: No focal deficit present.  Mental Status: He is alert. Mental status is at baseline.  Psychiatric:        Mood and Affect: Mood normal.        Behavior: Behavior normal.     ED Results / Procedures / Treatments   Labs (all labs ordered are listed, but only abnormal results are displayed) Labs Reviewed  BASIC METABOLIC PANEL - Abnormal; Notable for the following components:      Result Value   Creatinine, Ser 5.87 (*)    GFR, Estimated 10 (*)    All other components within normal limits  TROPONIN I (HIGH SENSITIVITY) - Abnormal; Notable for the following components:   Troponin I (High Sensitivity) 31 (*)    All other components within normal limits  TROPONIN I (HIGH SENSITIVITY) - Abnormal; Notable for the following  components:   Troponin I (High Sensitivity) 25 (*)    All other components within normal limits  CBC    EKG EKG Interpretation Date/Time:  Wednesday March 28 2023 10:59:10 EST Ventricular Rate:  72 PR Interval:  154 QRS Duration:  100 QT Interval:  406 QTC Calculation: 444 R Axis:   14  Text Interpretation: Normal sinus rhythm When compared with ECG of 26-Jan-2023 15:44, PREVIOUS ECG IS PRESENT Confirmed by Alvester Chou [phone]) on 03/28/2023 12:10:41 PM  Radiology DG Chest 2 View Result Date: 03/28/2023 CLINICAL DATA:  Chest pain. EXAM: CHEST - 2 VIEW COMPARISON:  01/26/2023. FINDINGS: Bilateral lung fields are clear. Bilateral costophrenic angles are clear. Normal cardio-mediastinal silhouette. No acute osseous abnormalities. The soft tissues are within normal limits. Left-sided hemodialysis catheter again seen with its tip overlying the cavoatrial junction region. IMPRESSION: No active cardiopulmonary disease. Electronically Signed   By: Jules Schick M.D.   On: 03/28/2023 13:11    Procedures Procedures    Medications Ordered in ED Medications - No data to display  ED Course/ Medical Decision Making/ A&P Clinical Course as of 03/28/23 1511  Wed Mar 28, 2023  1410 Chest pain free, awaiting 2nd troponin [MT]  1503 Reasessed, pain free, anticipating discharge home with son now at bedside.   RR normal 16 bpm, not 8 [MT]    Clinical Course User Index [MT] Arista Kettlewell, Kermit Balo, MD                                 Medical Decision Making Amount and/or Complexity of Data Reviewed Labs: ordered. Radiology: ordered.   This patient presents to the Emergency Department with complaint of chest pain. This involves an extensive number of treatment options, and is a complaint that carries with it a high risk of complications and morbidity, given the patient's comorbidity, including cardiovascular disease, hypertension, hyperlipidemia.The differential diagnosis includes ACS vs  Pneumothorax vs Reflux/Gastritis vs MSK pain vs Pneumonia vs other.  I felt PE was less likely given that patient symptoms were transient, has no hypoxia, no tachycardia  I ordered, reviewed, and interpreted labs.  Pertinent results include no emergent finding.  Delta troponins are negative I ordered imaging studies which included x-ray of the chest I independently visualized and interpreted imaging which showed no emergent finding and the monitor tracing which showed sinus rhythm. I agree with the radiologist interpretation Additional history was obtained from EMS External records obtained and reviewed showing cardiology office records as noted above I personally reviewed the patients ECG which showed sinus rhythm with no acute ischemic findings  After the interventions stated  above, I reevaluated the patient and found that they were asymptomatic and pain-free  Based on the patient's clinical exam, vital signs, risk factors, and ED testing, I felt that the patient's overall risk of life-threatening emergency such as ACS, PE, sepsis, or infection was low.  At this time, I felt the patient's presentation was most clinically consistent with nonspecific chest pain, but explained to the patient that this evaluation was not a definitive diagnostic workup.  I discussed outpatient follow up with primary care provider, and provided specialist office number on the patient's discharge paper if a referral was deemed necessary.  Return precautions were discussed with the patient.  I felt the patient was clinically stable for discharge.  Return precautions were discussed.  I encouraged the patient to follow-up with his cardiologist         Final Clinical Impression(s) / ED Diagnoses Final diagnoses:  Chest pain, unspecified type    Rx / DC Orders ED Discharge Orders     None         Terald Sleeper, MD 03/28/23 1511

## 2023-04-09 ENCOUNTER — Encounter (HOSPITAL_COMMUNITY): Payer: Self-pay | Admitting: *Deleted

## 2023-04-27 ENCOUNTER — Encounter (HOSPITAL_COMMUNITY): Payer: Self-pay | Admitting: *Deleted

## 2023-05-08 ENCOUNTER — Ambulatory Visit: Payer: Self-pay | Admitting: Cardiovascular Disease

## 2023-05-08 DIAGNOSIS — I739 Peripheral vascular disease, unspecified: Secondary | ICD-10-CM

## 2023-05-09 ENCOUNTER — Encounter (HOSPITAL_COMMUNITY): Payer: Self-pay | Admitting: *Deleted

## 2023-05-12 ENCOUNTER — Encounter (HOSPITAL_COMMUNITY): Payer: Self-pay | Admitting: *Deleted

## 2023-05-14 ENCOUNTER — Encounter (HOSPITAL_COMMUNITY): Payer: Self-pay | Admitting: *Deleted

## 2023-05-16 ENCOUNTER — Encounter: Payer: Self-pay | Admitting: Podiatry

## 2023-05-16 ENCOUNTER — Ambulatory Visit (INDEPENDENT_AMBULATORY_CARE_PROVIDER_SITE_OTHER): Payer: Medicare Other | Admitting: Podiatry

## 2023-05-16 VITALS — Ht 69.0 in | Wt 172.0 lb

## 2023-05-16 DIAGNOSIS — B351 Tinea unguium: Secondary | ICD-10-CM | POA: Diagnosis not present

## 2023-05-16 DIAGNOSIS — M79674 Pain in right toe(s): Secondary | ICD-10-CM

## 2023-05-16 DIAGNOSIS — E1151 Type 2 diabetes mellitus with diabetic peripheral angiopathy without gangrene: Secondary | ICD-10-CM

## 2023-05-16 DIAGNOSIS — M79675 Pain in left toe(s): Secondary | ICD-10-CM | POA: Diagnosis not present

## 2023-05-16 DIAGNOSIS — Z89431 Acquired absence of right foot: Secondary | ICD-10-CM

## 2023-05-16 DIAGNOSIS — L84 Corns and callosities: Secondary | ICD-10-CM | POA: Diagnosis not present

## 2023-05-24 NOTE — Progress Notes (Signed)
 Subjective:  Patient ID: Jon Anderson, male    DOB: [birth year],  MRN: [id number]  Jon Anderson presents to clinic today for at risk foot care. Patient has h/o NIDDM, neuropathy and PAD with amputation of Transmetatarsal amputation right foot  Chief Complaint  Patient presents with   [employer]    He is here for a nail trim and callous on bottom of feet, PCP is dr, Marinda and seen 3 months    New problem(s): Patient relates discomfort of plantar right foot lesion stating it is difficult for him to ambulate due to pain. Denies any redness, drainage or swelling of foot.  PCP is Valma Carwin, MD.  Allergies  Allergen Reactions   Contrast Media [Iodinated Contrast Media] Anaphylaxis    Cardiac arrest, Throat swelling, hives, SOB   Kiwi Extract Itching, Swelling and Other (See Comments)    Lips and face swell- breathing not affected   Flexeril  [Cyclobenzaprine ]     Hands become flimsy, can not hold things   Tape Other (See Comments)    Plastic tape causes blisters!!    Review of Systems: Negative except as noted in the HPI.  Objective: No changes noted in today's physical examination. There were no vitals filed for this visit. Jon Anderson is a pleasant 73 y.o. male in NAD. AAO x 3.  Vascular Examination: CFT <4 seconds left foot. DP pulses diminished b/l. PT pulses diminished b/l. Digital hair absent. Skin temperature gradient warm to warm b/l. No ischemia or gangrene. No cyanosis or clubbing noted b/l. Trace edema noted left foot.   Neurological Examination: Protective sensation diminished with 10g monofilament b/l.  Dermatological Examination: Pedal skin thin, shiny and atrophic b/l. No open wounds. No interdigital macerations.   Toenails 1-5 left foot thick, discolored, elongated with subungual debris and pain on dorsal palpation.   Hyperkeratotic lesion(s) distal stump right foot incision.  No erythema, no edema, no drainage, no fluctuance.  Preulcerative  lesion noted submet head 5 left foot and submet head 5 right foot. There is visible subdermal hemorrhage of preulcerative lesion of left foot There is no surrounding erythema, no edema, no drainage, no odor, no fluctuance.  Musculoskeletal Examination: Muscle strength 5/5 to LLE. Lower extremity amputation(s): Transmetatarsal amputation right foot. Wearing diabetic shoes today. Utilizes cane for ambulation assistance.  Radiographs: None Assessment/Plan: 1. Pain due to onychomycosis of toenails of both feet   2. Pre-ulcerative calluses   3. S/P transmetatarsal amputation of foot, right (HCC)   4. Type II diabetes mellitus with peripheral circulatory disorder (HCC)    -Consent given for treatment as described below: -Examined patient. -Continue surveillance with Vascular Surgery. -Continue foot and shoe inspections daily. Monitor blood glucose per PCP/Endocrinologist's recommendations. -Continue diabetic shoes daily. -Toenails were debrided in length and girth 1-5 left foot with sterile nail nippers and dremel without iatrogenic bleeding.  -Callus(es) distal stump right foot pared utilizing sharp debridement with sterile blade without complication or incident. Total number debrided =1. -Preulcerative lesion pared submet head 5 b/l utilizing sterile scalpel blade. Total number pared=2. -Patient/POA to call should there be question/concern in the interim.   Return in about 9 weeks (around 07/18/2023).  Jon Anderson, DPM      [place] LOCATION: [address]  [place], [place] [address]                   Office [phone]   [facility] LOCATION: [address] [place], [place] [address] Office [phone]

## 2023-06-01 ENCOUNTER — Ambulatory Visit (HOSPITAL_BASED_OUTPATIENT_CLINIC_OR_DEPARTMENT_OTHER)
Admission: RE | Admit: 2023-06-01 | Discharge: 2023-06-01 | Disposition: A | Discharge status: Home / Self Care | Payer: Medicare Other | Source: Ambulatory Visit | Attending: Cardiology | Admitting: Cardiology

## 2023-06-01 ENCOUNTER — Ambulatory Visit (HOSPITAL_COMMUNITY)
Admission: RE | Admit: 2023-06-01 | Discharge: 2023-06-01 | Disposition: A | Discharge status: Home / Self Care | Payer: Medicare Other | Source: Ambulatory Visit | Attending: Cardiology | Admitting: Cardiology

## 2023-06-01 DIAGNOSIS — I739 Peripheral vascular disease, unspecified: Secondary | ICD-10-CM | POA: Insufficient documentation

## 2023-06-04 LAB — VAS US ABI WITH/WO TBI: Left ABI: 0.87

## 2023-06-06 ENCOUNTER — Encounter (HOSPITAL_COMMUNITY): Payer: Self-pay | Admitting: *Deleted

## 2023-06-06 ENCOUNTER — Encounter: Payer: Self-pay | Admitting: Vascular Surgery

## 2023-06-06 ENCOUNTER — Ambulatory Visit (INDEPENDENT_AMBULATORY_CARE_PROVIDER_SITE_OTHER): Payer: Medicare Other | Admitting: Vascular Surgery

## 2023-06-06 VITALS — BP 121/75 | HR 75 | Temp 98.1°F | Resp 20 | Ht 69.0 in | Wt 171.9 lb

## 2023-06-06 DIAGNOSIS — N186 End stage renal disease: Secondary | ICD-10-CM | POA: Diagnosis not present

## 2023-06-06 NOTE — Progress Notes (Signed)
 Patient ID: Jon Anderson, male   DOB: [birth year], 73 y.o.   MRN: [id number]  Reason for Consult: Follow-up   Referred by Arita Miss, MD  Subjective:     HPI:  Jon Anderson is a 73 y.o. male History of end-stage renal disease most recently on dialysis via right upper extremity fistula.  He has a history of bilateral upper extremity accesses which have failed.  He states that he has undergone fistulogram in the past and the contrast caused him to code and he does not want any further contrasted procedures. Currently on hd via TDC.   Past Medical History:  Diagnosis Date   Anemia of chronic disease    Arthritis    CAD (coronary artery disease)    Carotid stenosis    a. <50% RICA, >70% LICA by duplex 07/2015   Chronic chest pain    occ   Constipation    chronic   Dyspnea    occasional with extertion   ESRD (end stage renal disease) on dialysis (HCC)    M-W-F- [name]   GERD (gastroesophageal reflux disease)    History of hiatal hernia    History of kidney stones    Passed   HNP (herniated nucleus pulposus), lumbar    HTN (hypertension)    echo 3/10: EF 60%, LAE   Hyperlipidemia    Mitral stenosis    mild MS 11/15/21   Nephrolithiasis    "passed them all"   PEA (Pulseless electrical activity) (HCC) 11/15/2021   PEA arrest felt likely related to contrast anaphylaxis despite premedication   Peripheral arterial disease (HCC)    a. s/p PTCA  right    Pneumonia yrs ago   Restless legs    Sciatic leg pain    Sleep apnea    does not use cpap   Snores    a. presumed OSA, pt has refused sleep eval in past.   Type II diabetes mellitus (HCC)    no longer on medications, checks blood glucose at home   Urinary frequency    Uses wheelchair    Walker as ambulation aid    Family History  Problem Relation Age of Onset   Heart attack Sister        died @ 74   Cancer Mother        died @ 65; unknown type   Diabetes Brother        deceased   Cirrhosis Father         alcohol related   Diabetes Father    Esophageal cancer Neg Hx    Colon cancer Neg Hx    Pancreatic cancer Neg Hx    Stomach cancer Neg Hx    Past Surgical History:  Procedure Laterality Date   A/V FISTULAGRAM Left 01/10/2021   Procedure: A/V FISTULAGRAM;  Surgeon: Maeola Harman, MD;  Location: [facility] INVASIVE CV LAB;  Service: Cardiovascular;  Laterality: Left;   A/V FISTULAGRAM Left 05/19/2021   Procedure: A/V Fistulagram;  Surgeon: Cephus Shelling, MD;  Location: [facility] INVASIVE CV LAB;  Service: Cardiovascular;  Laterality: Left;   ABDOMINAL AORTOGRAM W/LOWER EXTREMITY Bilateral 08/21/2019   Procedure: ABDOMINAL AORTOGRAM W/LOWER EXTREMITY;  Surgeon: Maeola Harman, MD;  Location: [facility] INVASIVE CV LAB;  Service: Cardiovascular;  Laterality: Bilateral;   AMPUTATION Right 07/25/2019   Procedure: RIGHT 5th RAY AMPUTATION;  Surgeon: Nadara Mustard, MD;  Location: [facility] OR;  Service: Orthopedics;  Laterality: Right;   AMPUTATION Right  11/19/2019   Procedure: RIGHT TRANSMETATARSAL AMPUTATION;  Surgeon: Nadara Mustard, MD;  Location: [facility] OR;  Service: Orthopedics;  Laterality: Right;   ANGIOPLASTY / STENTING FEMORAL Left 12/11/2013   dr berry   AV FISTULA PLACEMENT Left 03/19/2014   Procedure: CREATION OF ARTERIOVENOUS (AV) FISTULA  LEFT UPPER ARM;  Surgeon: Pryor Ochoa, MD;  Location: [facility] OR;  Service: Vascular;  Laterality: Left;   AV FISTULA PLACEMENT Left 11/09/2020   Procedure: LEFT ARM ARTERIOVENOUS (AV) FISTULA CREATION;  Surgeon: Maeola Harman, MD;  Location: [facility] OR;  Service: Vascular;  Laterality: Left;   AV FISTULA PLACEMENT Left 02/15/2021   Procedure: INSERTION OF LEFT ARM ARTERIOVENOUS GORE-TEX GRAFT;  Surgeon: Maeola Harman, MD;  Location: [facility] OR;  Service: Vascular;  Laterality: Left;   AV FISTULA PLACEMENT Right 02/16/2022   Procedure: RIGHT ARM BRACHIOCEPAHLIC ARTERIOVENOUS (AV) FISTULA CREATION;  Surgeon: Maeola Harman, MD;   Location: [facility] OR;  Service: Vascular;  Laterality: Right;  Regional and MAC   BACK SURGERY  01/2018   screws placed    BASCILIC VEIN TRANSPOSITION Left 12/14/2020   Procedure: REVISION OF LEFT ARM SECOND STAGE BASILIC VEIN TRANSPOSITION;  Surgeon: Maeola Harman, MD;  Location: [facility] OR;  Service: Vascular;  Laterality: Left;   BASCILIC VEIN TRANSPOSITION Right 04/18/2022   Procedure: RIGHT BRACHIOBASILIC FISTULA SECOND STAGE TRANSPOSITION;  Surgeon: Maeola Harman, MD;  Location: [facility] OR;  Service: Vascular;  Laterality: Right;   CARDIAC CATHETERIZATION  2001 and 2010    COLONOSCOPY W/ BIOPSIES AND POLYPECTOMY     COLONOSCOPY WITH PROPOFOL N/A 08/01/2016   Procedure: COLONOSCOPY WITH PROPOFOL;  Surgeon: Jeani Hawking, MD;  Location: [facility] ENDOSCOPY;  Service: Endoscopy;  Laterality: N/A;   COLONOSCOPY WITH PROPOFOL N/A 01/17/2022   Procedure: COLONOSCOPY WITH PROPOFOL;  Surgeon: Jeani Hawking, MD;  Location: [facility] ENDOSCOPY;  Service: Gastroenterology;  Laterality: N/A;   CORONARY ARTERY BYPASS GRAFT  2019   [facility] x 1 bypass   ESOPHAGOGASTRODUODENOSCOPY (EGD) WITH PROPOFOL N/A 08/01/2016   Procedure: ESOPHAGOGASTRODUODENOSCOPY (EGD) WITH PROPOFOL;  Surgeon: Jeani Hawking, MD;  Location: [facility] ENDOSCOPY;  Service: Endoscopy;  Laterality: N/A;   FISTULA SUPERFICIALIZATION Left 02/10/2020   Procedure: 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;  Surgeon: Larina Earthly, MD;  Location: [facility] OR;  Service: Vascular;  Laterality: Left;   FISTULA SUPERFICIALIZATION Left 03/23/2020   Procedure: LEFT UPPER EXTREMITY ARTERIOVENOUS FISTULA PLICATION;  Surgeon: Larina Earthly, MD;  Location: [facility] OR;  Service: Vascular;  Laterality: Left;   FOOT FRACTURE SURGERY Right    ligament repair   FRACTURE SURGERY     left forearm   GRAFT APPLICATION Right 05/13/2019   Procedure: FAT GRAFT APPLICATION;  Surgeon: Park Liter, DPM;  Location: [facility];  Service: Podiatry;  Laterality:  Right;   HEMATOMA EVACUATION Left 11/09/2020   Procedure: EVACUATION HEMATOMA LEFT ARM;  Surgeon: Chuck Hint, MD;  Location: [facility] OR;  Service: Vascular;  Laterality: Left;   INGUINAL HERNIA REPAIR Left    IR AV DIALY SHUNT INTRO NEEDLE/INTRACATH INITIAL W/PTA/IMG LEFT  11/15/2021   IR AV DIALY SHUNT INTRO NEEDLE/INTRACATH INITIAL W/PTA/IMG RIGHT Right 08/15/2022   IR FLUORO GUIDE CV LINE LEFT  01/26/2023   IR FLUORO GUIDE CV LINE RIGHT  01/20/2022   IR FLUORO GUIDE CV LINE RIGHT  05/03/2022   IR REMOVAL TUN CV CATH W/O FL  06/16/2021   IR REMOVAL TUN CV CATH W/O FL  07/06/2022  IR US GUIDE VASC ACCESS LEFT  11/15/2021   IR US GUIDE VASC ACCESS LEFT  11/15/2021   IR US GUIDE VASC ACCESS LEFT  01/26/2023   IR US GUIDE VASC ACCESS RIGHT  11/15/2021   IR US GUIDE VASC ACCESS RIGHT  01/20/2022   IR US GUIDE VASC ACCESS RIGHT  08/15/2022   LEFT HEART CATH AND CORS/GRAFTS ANGIOGRAPHY N/A 04/27/2017   Procedure: LEFT HEART CATH AND CORS/GRAFTS ANGIOGRAPHY;  Surgeon: Marykay Lex, MD;  Location: [facility] INVASIVE CV LAB;  Service: Cardiovascular;  Laterality: N/A;   LEFT HEART CATHETERIZATION WITH CORONARY ANGIOGRAM N/A 06/22/2014   Procedure: LEFT HEART CATHETERIZATION WITH CORONARY ANGIOGRAM;  Surgeon: Lennette Bihari, MD;  Location: [facility] CATH LAB;  Service: Cardiovascular;  Laterality: N/A;   LOWER EXTREMITY ANGIOGRAM Left 12/11/2013   Procedure: LOWER EXTREMITY ANGIOGRAM;  Surgeon: Runell Gess, MD;  Location: [facility] CATH LAB;  Service: Cardiovascular;  Laterality: Left;   LUMBAR LAMINECTOMY/DECOMPRESSION MICRODISCECTOMY Right 07/03/2017   Procedure: MICRODISCECTOMY LUMBAR FIVE - SACRAL ONE RIGHT;  Surgeon: Lisbeth Renshaw, MD;  Location: [facility] OR;  Service: Neurosurgery;  Laterality: Right;   LUMBAR LAMINECTOMY/DECOMPRESSION MICRODISCECTOMY Right 10/19/2017   Procedure: MICRODISCECTOMY LUMBAR FIVE- SACRAL 1 ONE ;  Surgeon: Lisbeth Renshaw, MD;  Location: [facility] OR;  Service: Neurosurgery;  Laterality: Right;    PERIPHERAL VASCULAR BALLOON ANGIOPLASTY Left 05/19/2021   Procedure: PERIPHERAL VASCULAR BALLOON ANGIOPLASTY;  Surgeon: Cephus Shelling, MD;  Location: [facility] INVASIVE CV LAB;  Service: Cardiovascular;  Laterality: Left;   PERIPHERAL VASCULAR INTERVENTION Left 01/10/2021   Procedure: PERIPHERAL VASCULAR INTERVENTION;  Surgeon: Maeola Harman, MD;  Location: [facility] INVASIVE CV LAB;  Service: Cardiovascular;  Laterality: Left;   POLYPECTOMY  01/17/2022   Procedure: POLYPECTOMY;  Surgeon: Jeani Hawking, MD;  Location: [facility];  Service: Gastroenterology;;   THROMBECTOMY W/ EMBOLECTOMY Left 11/09/2020   Procedure: THROMBECTOMY OF LEFT ARTERIOVENOUS FISTULA;  Surgeon: Chuck Hint, MD;  Location: [facility] OR;  Service: Vascular;  Laterality: Left;   TONSILLECTOMY AND ADENOIDECTOMY     WISDOM TOOTH EXTRACTION     WOUND DEBRIDEMENT Right 05/13/2019   Procedure: DEBRIDEMENT WOUND;  Surgeon: Park Liter, DPM;  Location: [facility];  Service: Podiatry;  Laterality: Right;    Short Social History:  Social History   Tobacco Use   Smoking status: Former    Current packs/day: 0.00    Average packs/day: 1 pack/day for 2.0 years (2.0 ttl pk-yrs)    Types: Cigarettes    Start date: 60    Quit date: 1978    Years since quitting: 47.1    Passive exposure: Never   Smokeless tobacco: Never  Substance Use Topics   Alcohol use: Not Currently    Alcohol/week: 0.0 standard drinks of alcohol    Comment: h/o social drinking    Allergies  Allergen Reactions   Contrast Media [Iodinated Contrast Media] Anaphylaxis    Cardiac arrest, Throat swelling, hives, SOB   Kiwi Extract Itching, Swelling and Other (See Comments)    Lips and face swell- breathing not affected   Flexeril [Cyclobenzaprine]     Hands become flimsy, can not hold things   Tape Other (See Comments)    "Plastic" tape causes blisters!!    Current Outpatient Medications  Medication Sig Dispense Refill    calcium acetate (PHOSLO) 667 MG capsule Take 2 capsules (1,334 mg total) by mouth 2 (two) times daily with a meal. (Patient taking differently: Take 1,334 mg by mouth in the  morning and at bedtime.) 180 capsule 0   clopidogrel (PLAVIX) 75 MG tablet Take 75 mg by mouth in the morning.     gabapentin (NEURONTIN) 100 MG capsule Take 100-200 mg by mouth See admin instructions. 100 mg in the morning, 200 mg in the evening     multivitamin (RENA-VIT) TABS tablet Take 1 tablet by mouth at bedtime. 30 tablet 0   pantoprazole (PROTONIX) 40 MG tablet Take 40 mg by mouth daily before breakfast.     No current facility-administered medications for this visit.    Review of Systems  Constitutional:  Constitutional negative. HENT: HENT negative.  Eyes: Eyes negative.  Respiratory: Respiratory negative.  Cardiovascular: Cardiovascular negative.  GI: Gastrointestinal negative.  Musculoskeletal: Musculoskeletal negative.  Skin: Skin negative.  Neurological: Neurological negative. Hematologic: Hematologic/lymphatic negative.  Psychiatric: Psychiatric negative.        Objective:  Objective   Vitals:   06/06/23 1336  BP: 121/75  Pulse: 75  Resp: 20  Temp: 98.1 F (36.7 C)  SpO2: 92%  Weight: 171 lb 14.4 oz (78 kg)  Height: 5\' 9"  (1.753 m)   Body mass index is 25.39 kg/m.  Physical Exam HENT:     Head: Normocephalic.     Nose: Nose normal.     Mouth/Throat:     Mouth: Mucous membranes are moist.  Eyes:     Pupils: Pupils are equal, round, and reactive to light.  Cardiovascular:     Pulses:          Radial pulses are 2+ on the right side and 2+ on the left side.  Pulmonary:     Effort: Pulmonary effort is normal.  Abdominal:     General: Abdomen is flat.  Musculoskeletal:     Right lower leg: No edema.     Left lower leg: No edema.     Comments: Thrombosed right upper extremity fistula and upper extremity accesses on the left  Skin:    Capillary Refill: Capillary refill takes  less than 2 seconds.  Neurological:     General: No focal deficit present.     Mental Status: He is alert.     Data: RIGHT     PSV cm/sRatioStenosisWaveform Comments  +----------+--------+-----+--------+---------+--------+  CFA Prox  90                   triphasic          +----------+--------+-----+--------+---------+--------+  SFA Prox  61                   triphasic          +----------+--------+-----+--------+---------+--------+  SFA Mid   40                   biphasic           +----------+--------+-----+--------+---------+--------+  SFA Distal37                   triphasic          +----------+--------+-----+--------+---------+--------+  TP Trunk  57                   triphasic          +----------+--------+-----+--------+---------+--------+  ATA Prox  63                   biphasic           +----------+--------+-----+--------+---------+--------+  ATA Mid   0  occluded                   +----------+--------+-----+--------+---------+--------+  ATA Distal0            occluded                   +----------+--------+-----+--------+---------+--------+   Occluded mid and distal right ATA, s/p angioplasty.    Right Stent(s):  +----------------+--------+--------+---------+--------+  Popliteal ArteryPSV cm/sStenosisWaveform Comments  +----------------+--------+--------+---------+--------+  Prox to Stent   40              biphasic           +----------------+--------+--------+---------+--------+  Proximal Stent  31              biphasic           +----------------+--------+--------+---------+--------+  Mid Stent       43              biphasic           +----------------+--------+--------+---------+--------+  Distal Stent    68              triphasic          +----------------+--------+--------+---------+--------+  Distal to Stent 90              biphasic            +----------------+--------+--------+---------+--------+   Patent right popliteal artery stent without evidence of stenosis.    +----------+--------+-----+---------------+---------+----------------------  ----+  LEFT     PSV cm/sRatioStenosis       Waveform Comments                     +----------+--------+-----+---------------+---------+----------------------  ----+  CFA Prox  84                          triphasic                             +----------+--------+-----+---------------+---------+----------------------  ----+  SFA Prox  42                          triphasic                             +----------+--------+-----+---------------+---------+----------------------  ----+  SFA Mid   31                          biphasic                              +----------+--------+-----+---------------+---------+----------------------  ----+  SFA Distal115     2.9  50-74% stenosisbiphasic low end range,  stenosis                                                    based on VR 2.9              +----------+--------+-----+---------------+---------+----------------------  ----+  POP Prox  81                          biphasic                              +----------+--------+-----+---------------+---------+----------------------  ----+  POP Mid   93                          triphasic                             +----------+--------+-----+---------------+---------+----------------------  ----+  POP Distal93                          triphasic                             +----------+--------+-----+---------------+---------+----------------------  ----+  TP Trunk  73                          biphasic                              +----------+--------+-----+---------------+---------+----------------------  ----+   A focal velocity elevation of 115 cm/s was obtained at distal SFA with  post stenotic turbulence with a  VR of 2.9. Findings are characteristic of  50-74% stenosis.       Summary:  Right: Severe progression is noted compared to previous study.   Atherosclerosis and medial calcifications throughout without evidence of  stenosis from the common femoral artery to the tibioperoneal trunk, s/p  popliteal artery stent placement.  Total occlusion of the mid and distal ATA, s/p angioplasty.   Left: Mild progression is noted compared to previous study.   Atherosclerosis and medial calcifications throughout.  50-99% stenosis in the distal SFA, based on VR 2.9, low end range.    ABI Findings:  +---------+------------------+-----+-----------+---------------------------  -+  Right   Rt Pressure (mmHg)IndexWaveform   Comment                        +---------+------------------+-----+-----------+---------------------------  -+  Brachial 108                                                              +---------+------------------+-----+-----------+---------------------------  -+  PTA     254               2.00 multiphasicbiphasic to brisk  monophasic  +---------+------------------+-----+-----------+---------------------------  -+  DP      137               1.08 multiphasic                               +---------+------------------+-----+-----------+---------------------------  -+  Great Toe                                  Transmetatarsal Amputation     +---------+------------------+-----+-----------+---------------------------  -+   +---------+------------------+-----+----------+-------+  Left    Lt Pressure (mmHg)IndexWaveform  Comment  +---------+------------------+-----+----------+-------+  Brachial 127                                       +---------+------------------+-----+----------+-------+  PTA     111               0.87 monophasic         +---------+------------------+-----+----------+-------+  DP      68                 0.54 monophasic         +---------+------------------+-----+----------+-------+  Great Toe60                0.47 Abnormal           +---------+------------------+-----+----------+-------+   +-------+----------------+-----------------+----------------+--------------  ----+  ABI/TBIToday's ABI     Today's TBI      Previous ABI    Previous TBI         +-------+----------------+-----------------+----------------+--------------  ----+  Right non-compressibleTransmetatarsal   non-compressibleTransmetatarsal                            Amputation                       Amputation           +-------+----------------+-----------------+----------------+--------------  ----+  Left  .87             .47              non-compressible.43                  +-------+----------------+-----------------+----------------+--------------  ----+         Right ABIs and left TBIs appear essentially unchanged compared to prior  study on 05/04/2022. Left ABIs appear decreased compared to prior study on  05/04/2022.    Summary:  Right: Resting right ankle-brachial index indicates noncompressible right  lower extremity arteries.   Transmetatarsal Amputation.  Left: Resting left ankle-brachial index indicates mild left lower  extremity arterial disease. The left toe-brachial index is abnormal.        Assessment/Plan:    73 year old male with history of end-stage renal disease and treatment of right lower extremity atherosclerosis of native arteries to heal transmetatarsal amputation which is now well-healed with adequate flow in the right lower extremity and no left lower extremity symptoms.  Unfortunately his bilateral upper extremity accesses have all thrombosed.  Patient does not want any further contrast given that he is had a code event in the past.  As such we will plan for right upper extremity AV graft on a nondialysis day in the near future and he will continue  dialysis through the catheter until the graft is ready for use.      Maeola Harman MD [facility]

## 2023-06-06 NOTE — H&P (View-Only) (Signed)
 Patient ID: Jon Anderson, male   DOB: [birth year], 73 y.o.   MRN: [id number]  Reason for Consult: Follow-up   Referred by Arita Miss, MD  Subjective:     HPI:  Jon Anderson is a 73 y.o. male History of end-stage renal disease most recently on dialysis via right upper extremity fistula.  He has a history of bilateral upper extremity accesses which have failed.  He states that he has undergone fistulogram in the past and the contrast caused him to code and he does not want any further contrasted procedures. Currently on hd via TDC.   Past Medical History:  Diagnosis Date   Anemia of chronic disease    Arthritis    CAD (coronary artery disease)    Carotid stenosis    a. <50% RICA, >70% LICA by duplex 07/2015   Chronic chest pain    occ   Constipation    chronic   Dyspnea    occasional with extertion   ESRD (end stage renal disease) on dialysis (HCC)    M-W-F- [name]   GERD (gastroesophageal reflux disease)    History of hiatal hernia    History of kidney stones    Passed   HNP (herniated nucleus pulposus), lumbar    HTN (hypertension)    echo 3/10: EF 60%, LAE   Hyperlipidemia    Mitral stenosis    mild MS 11/15/21   Nephrolithiasis    "passed them all"   PEA (Pulseless electrical activity) (HCC) 11/15/2021   PEA arrest felt likely related to contrast anaphylaxis despite premedication   Peripheral arterial disease (HCC)    a. s/p PTCA  right    Pneumonia yrs ago   Restless legs    Sciatic leg pain    Sleep apnea    does not use cpap   Snores    a. presumed OSA, pt has refused sleep eval in past.   Type II diabetes mellitus (HCC)    no longer on medications, checks blood glucose at home   Urinary frequency    Uses wheelchair    Walker as ambulation aid    Family History  Problem Relation Age of Onset   Heart attack Sister        died @ 74   Cancer Mother        died @ 65; unknown type   Diabetes Brother        deceased   Cirrhosis Father         alcohol related   Diabetes Father    Esophageal cancer Neg Hx    Colon cancer Neg Hx    Pancreatic cancer Neg Hx    Stomach cancer Neg Hx    Past Surgical History:  Procedure Laterality Date   A/V FISTULAGRAM Left 01/10/2021   Procedure: A/V FISTULAGRAM;  Surgeon: Maeola Harman, MD;  Location: [facility] INVASIVE CV LAB;  Service: Cardiovascular;  Laterality: Left;   A/V FISTULAGRAM Left 05/19/2021   Procedure: A/V Fistulagram;  Surgeon: Cephus Shelling, MD;  Location: [facility] INVASIVE CV LAB;  Service: Cardiovascular;  Laterality: Left;   ABDOMINAL AORTOGRAM W/LOWER EXTREMITY Bilateral 08/21/2019   Procedure: ABDOMINAL AORTOGRAM W/LOWER EXTREMITY;  Surgeon: Maeola Harman, MD;  Location: [facility] INVASIVE CV LAB;  Service: Cardiovascular;  Laterality: Bilateral;   AMPUTATION Right 07/25/2019   Procedure: RIGHT 5th RAY AMPUTATION;  Surgeon: Nadara Mustard, MD;  Location: [facility] OR;  Service: Orthopedics;  Laterality: Right;   AMPUTATION Right  11/19/2019   Procedure: RIGHT TRANSMETATARSAL AMPUTATION;  Surgeon: Nadara Mustard, MD;  Location: [facility] OR;  Service: Orthopedics;  Laterality: Right;   ANGIOPLASTY / STENTING FEMORAL Left 12/11/2013   dr berry   AV FISTULA PLACEMENT Left 03/19/2014   Procedure: CREATION OF ARTERIOVENOUS (AV) FISTULA  LEFT UPPER ARM;  Surgeon: Pryor Ochoa, MD;  Location: [facility] OR;  Service: Vascular;  Laterality: Left;   AV FISTULA PLACEMENT Left 11/09/2020   Procedure: LEFT ARM ARTERIOVENOUS (AV) FISTULA CREATION;  Surgeon: Maeola Harman, MD;  Location: [facility] OR;  Service: Vascular;  Laterality: Left;   AV FISTULA PLACEMENT Left 02/15/2021   Procedure: INSERTION OF LEFT ARM ARTERIOVENOUS GORE-TEX GRAFT;  Surgeon: Maeola Harman, MD;  Location: [facility] OR;  Service: Vascular;  Laterality: Left;   AV FISTULA PLACEMENT Right 02/16/2022   Procedure: RIGHT ARM BRACHIOCEPAHLIC ARTERIOVENOUS (AV) FISTULA CREATION;  Surgeon: Maeola Harman, MD;   Location: [facility] OR;  Service: Vascular;  Laterality: Right;  Regional and MAC   BACK SURGERY  01/2018   screws placed    BASCILIC VEIN TRANSPOSITION Left 12/14/2020   Procedure: REVISION OF LEFT ARM SECOND STAGE BASILIC VEIN TRANSPOSITION;  Surgeon: Maeola Harman, MD;  Location: [facility] OR;  Service: Vascular;  Laterality: Left;   BASCILIC VEIN TRANSPOSITION Right 04/18/2022   Procedure: RIGHT BRACHIOBASILIC FISTULA SECOND STAGE TRANSPOSITION;  Surgeon: Maeola Harman, MD;  Location: [facility] OR;  Service: Vascular;  Laterality: Right;   CARDIAC CATHETERIZATION  2001 and 2010    COLONOSCOPY W/ BIOPSIES AND POLYPECTOMY     COLONOSCOPY WITH PROPOFOL N/A 08/01/2016   Procedure: COLONOSCOPY WITH PROPOFOL;  Surgeon: Jeani Hawking, MD;  Location: [facility] ENDOSCOPY;  Service: Endoscopy;  Laterality: N/A;   COLONOSCOPY WITH PROPOFOL N/A 01/17/2022   Procedure: COLONOSCOPY WITH PROPOFOL;  Surgeon: Jeani Hawking, MD;  Location: [facility] ENDOSCOPY;  Service: Gastroenterology;  Laterality: N/A;   CORONARY ARTERY BYPASS GRAFT  2019   [facility] x 1 bypass   ESOPHAGOGASTRODUODENOSCOPY (EGD) WITH PROPOFOL N/A 08/01/2016   Procedure: ESOPHAGOGASTRODUODENOSCOPY (EGD) WITH PROPOFOL;  Surgeon: Jeani Hawking, MD;  Location: [facility] ENDOSCOPY;  Service: Endoscopy;  Laterality: N/A;   FISTULA SUPERFICIALIZATION Left 02/10/2020   Procedure: 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;  Surgeon: Larina Earthly, MD;  Location: [facility] OR;  Service: Vascular;  Laterality: Left;   FISTULA SUPERFICIALIZATION Left 03/23/2020   Procedure: LEFT UPPER EXTREMITY ARTERIOVENOUS FISTULA PLICATION;  Surgeon: Larina Earthly, MD;  Location: [facility] OR;  Service: Vascular;  Laterality: Left;   FOOT FRACTURE SURGERY Right    ligament repair   FRACTURE SURGERY     left forearm   GRAFT APPLICATION Right 05/13/2019   Procedure: FAT GRAFT APPLICATION;  Surgeon: Park Liter, DPM;  Location: [facility];  Service: Podiatry;  Laterality:  Right;   HEMATOMA EVACUATION Left 11/09/2020   Procedure: EVACUATION HEMATOMA LEFT ARM;  Surgeon: Chuck Hint, MD;  Location: [facility] OR;  Service: Vascular;  Laterality: Left;   INGUINAL HERNIA REPAIR Left    IR AV DIALY SHUNT INTRO NEEDLE/INTRACATH INITIAL W/PTA/IMG LEFT  11/15/2021   IR AV DIALY SHUNT INTRO NEEDLE/INTRACATH INITIAL W/PTA/IMG RIGHT Right 08/15/2022   IR FLUORO GUIDE CV LINE LEFT  01/26/2023   IR FLUORO GUIDE CV LINE RIGHT  01/20/2022   IR FLUORO GUIDE CV LINE RIGHT  05/03/2022   IR REMOVAL TUN CV CATH W/O FL  06/16/2021   IR REMOVAL TUN CV CATH W/O FL  07/06/2022  IR US GUIDE VASC ACCESS LEFT  11/15/2021   IR US GUIDE VASC ACCESS LEFT  11/15/2021   IR US GUIDE VASC ACCESS LEFT  01/26/2023   IR US GUIDE VASC ACCESS RIGHT  11/15/2021   IR US GUIDE VASC ACCESS RIGHT  01/20/2022   IR US GUIDE VASC ACCESS RIGHT  08/15/2022   LEFT HEART CATH AND CORS/GRAFTS ANGIOGRAPHY N/A 04/27/2017   Procedure: LEFT HEART CATH AND CORS/GRAFTS ANGIOGRAPHY;  Surgeon: Marykay Lex, MD;  Location: [facility] INVASIVE CV LAB;  Service: Cardiovascular;  Laterality: N/A;   LEFT HEART CATHETERIZATION WITH CORONARY ANGIOGRAM N/A 06/22/2014   Procedure: LEFT HEART CATHETERIZATION WITH CORONARY ANGIOGRAM;  Surgeon: Lennette Bihari, MD;  Location: [facility] CATH LAB;  Service: Cardiovascular;  Laterality: N/A;   LOWER EXTREMITY ANGIOGRAM Left 12/11/2013   Procedure: LOWER EXTREMITY ANGIOGRAM;  Surgeon: Runell Gess, MD;  Location: [facility] CATH LAB;  Service: Cardiovascular;  Laterality: Left;   LUMBAR LAMINECTOMY/DECOMPRESSION MICRODISCECTOMY Right 07/03/2017   Procedure: MICRODISCECTOMY LUMBAR FIVE - SACRAL ONE RIGHT;  Surgeon: Lisbeth Renshaw, MD;  Location: [facility] OR;  Service: Neurosurgery;  Laterality: Right;   LUMBAR LAMINECTOMY/DECOMPRESSION MICRODISCECTOMY Right 10/19/2017   Procedure: MICRODISCECTOMY LUMBAR FIVE- SACRAL 1 ONE ;  Surgeon: Lisbeth Renshaw, MD;  Location: [facility] OR;  Service: Neurosurgery;  Laterality: Right;    PERIPHERAL VASCULAR BALLOON ANGIOPLASTY Left 05/19/2021   Procedure: PERIPHERAL VASCULAR BALLOON ANGIOPLASTY;  Surgeon: Cephus Shelling, MD;  Location: [facility] INVASIVE CV LAB;  Service: Cardiovascular;  Laterality: Left;   PERIPHERAL VASCULAR INTERVENTION Left 01/10/2021   Procedure: PERIPHERAL VASCULAR INTERVENTION;  Surgeon: Maeola Harman, MD;  Location: [facility] INVASIVE CV LAB;  Service: Cardiovascular;  Laterality: Left;   POLYPECTOMY  01/17/2022   Procedure: POLYPECTOMY;  Surgeon: Jeani Hawking, MD;  Location: [facility];  Service: Gastroenterology;;   THROMBECTOMY W/ EMBOLECTOMY Left 11/09/2020   Procedure: THROMBECTOMY OF LEFT ARTERIOVENOUS FISTULA;  Surgeon: Chuck Hint, MD;  Location: [facility] OR;  Service: Vascular;  Laterality: Left;   TONSILLECTOMY AND ADENOIDECTOMY     WISDOM TOOTH EXTRACTION     WOUND DEBRIDEMENT Right 05/13/2019   Procedure: DEBRIDEMENT WOUND;  Surgeon: Park Liter, DPM;  Location: [facility];  Service: Podiatry;  Laterality: Right;    Short Social History:  Social History   Tobacco Use   Smoking status: Former    Current packs/day: 0.00    Average packs/day: 1 pack/day for 2.0 years (2.0 ttl pk-yrs)    Types: Cigarettes    Start date: 60    Quit date: 1978    Years since quitting: 47.1    Passive exposure: Never   Smokeless tobacco: Never  Substance Use Topics   Alcohol use: Not Currently    Alcohol/week: 0.0 standard drinks of alcohol    Comment: h/o social drinking    Allergies  Allergen Reactions   Contrast Media [Iodinated Contrast Media] Anaphylaxis    Cardiac arrest, Throat swelling, hives, SOB   Kiwi Extract Itching, Swelling and Other (See Comments)    Lips and face swell- breathing not affected   Flexeril [Cyclobenzaprine]     Hands become flimsy, can not hold things   Tape Other (See Comments)    "Plastic" tape causes blisters!!    Current Outpatient Medications  Medication Sig Dispense Refill    calcium acetate (PHOSLO) 667 MG capsule Take 2 capsules (1,334 mg total) by mouth 2 (two) times daily with a meal. (Patient taking differently: Take 1,334 mg by mouth in the  morning and at bedtime.) 180 capsule 0   clopidogrel (PLAVIX) 75 MG tablet Take 75 mg by mouth in the morning.     gabapentin (NEURONTIN) 100 MG capsule Take 100-200 mg by mouth See admin instructions. 100 mg in the morning, 200 mg in the evening     multivitamin (RENA-VIT) TABS tablet Take 1 tablet by mouth at bedtime. 30 tablet 0   pantoprazole (PROTONIX) 40 MG tablet Take 40 mg by mouth daily before breakfast.     No current facility-administered medications for this visit.    Review of Systems  Constitutional:  Constitutional negative. HENT: HENT negative.  Eyes: Eyes negative.  Respiratory: Respiratory negative.  Cardiovascular: Cardiovascular negative.  GI: Gastrointestinal negative.  Musculoskeletal: Musculoskeletal negative.  Skin: Skin negative.  Neurological: Neurological negative. Hematologic: Hematologic/lymphatic negative.  Psychiatric: Psychiatric negative.        Objective:  Objective   Vitals:   06/06/23 1336  BP: 121/75  Pulse: 75  Resp: 20  Temp: 98.1 F (36.7 C)  SpO2: 92%  Weight: 171 lb 14.4 oz (78 kg)  Height: 5\' 9"  (1.753 m)   Body mass index is 25.39 kg/m.  Physical Exam HENT:     Head: Normocephalic.     Nose: Nose normal.     Mouth/Throat:     Mouth: Mucous membranes are moist.  Eyes:     Pupils: Pupils are equal, round, and reactive to light.  Cardiovascular:     Pulses:          Radial pulses are 2+ on the right side and 2+ on the left side.  Pulmonary:     Effort: Pulmonary effort is normal.  Abdominal:     General: Abdomen is flat.  Musculoskeletal:     Right lower leg: No edema.     Left lower leg: No edema.     Comments: Thrombosed right upper extremity fistula and upper extremity accesses on the left  Skin:    Capillary Refill: Capillary refill takes  less than 2 seconds.  Neurological:     General: No focal deficit present.     Mental Status: He is alert.     Data: RIGHT     PSV cm/sRatioStenosisWaveform Comments  +----------+--------+-----+--------+---------+--------+  CFA Prox  90                   triphasic          +----------+--------+-----+--------+---------+--------+  SFA Prox  61                   triphasic          +----------+--------+-----+--------+---------+--------+  SFA Mid   40                   biphasic           +----------+--------+-----+--------+---------+--------+  SFA Distal37                   triphasic          +----------+--------+-----+--------+---------+--------+  TP Trunk  57                   triphasic          +----------+--------+-----+--------+---------+--------+  ATA Prox  63                   biphasic           +----------+--------+-----+--------+---------+--------+  ATA Mid   0  occluded                   +----------+--------+-----+--------+---------+--------+  ATA Distal0            occluded                   +----------+--------+-----+--------+---------+--------+   Occluded mid and distal right ATA, s/p angioplasty.    Right Stent(s):  +----------------+--------+--------+---------+--------+  Popliteal ArteryPSV cm/sStenosisWaveform Comments  +----------------+--------+--------+---------+--------+  Prox to Stent   40              biphasic           +----------------+--------+--------+---------+--------+  Proximal Stent  31              biphasic           +----------------+--------+--------+---------+--------+  Mid Stent       43              biphasic           +----------------+--------+--------+---------+--------+  Distal Stent    68              triphasic          +----------------+--------+--------+---------+--------+  Distal to Stent 90              biphasic            +----------------+--------+--------+---------+--------+   Patent right popliteal artery stent without evidence of stenosis.    +----------+--------+-----+---------------+---------+----------------------  ----+  LEFT     PSV cm/sRatioStenosis       Waveform Comments                     +----------+--------+-----+---------------+---------+----------------------  ----+  CFA Prox  84                          triphasic                             +----------+--------+-----+---------------+---------+----------------------  ----+  SFA Prox  42                          triphasic                             +----------+--------+-----+---------------+---------+----------------------  ----+  SFA Mid   31                          biphasic                              +----------+--------+-----+---------------+---------+----------------------  ----+  SFA Distal115     2.9  50-74% stenosisbiphasic low end range,  stenosis                                                    based on VR 2.9              +----------+--------+-----+---------------+---------+----------------------  ----+  POP Prox  81                          biphasic                              +----------+--------+-----+---------------+---------+----------------------  ----+  POP Mid   93                          triphasic                             +----------+--------+-----+---------------+---------+----------------------  ----+  POP Distal93                          triphasic                             +----------+--------+-----+---------------+---------+----------------------  ----+  TP Trunk  73                          biphasic                              +----------+--------+-----+---------------+---------+----------------------  ----+   A focal velocity elevation of 115 cm/s was obtained at distal SFA with  post stenotic turbulence with a  VR of 2.9. Findings are characteristic of  50-74% stenosis.       Summary:  Right: Severe progression is noted compared to previous study.   Atherosclerosis and medial calcifications throughout without evidence of  stenosis from the common femoral artery to the tibioperoneal trunk, s/p  popliteal artery stent placement.  Total occlusion of the mid and distal ATA, s/p angioplasty.   Left: Mild progression is noted compared to previous study.   Atherosclerosis and medial calcifications throughout.  50-99% stenosis in the distal SFA, based on VR 2.9, low end range.    ABI Findings:  +---------+------------------+-----+-----------+---------------------------  -+  Right   Rt Pressure (mmHg)IndexWaveform   Comment                        +---------+------------------+-----+-----------+---------------------------  -+  Brachial 108                                                              +---------+------------------+-----+-----------+---------------------------  -+  PTA     254               2.00 multiphasicbiphasic to brisk  monophasic  +---------+------------------+-----+-----------+---------------------------  -+  DP      137               1.08 multiphasic                               +---------+------------------+-----+-----------+---------------------------  -+  Great Toe                                  Transmetatarsal Amputation     +---------+------------------+-----+-----------+---------------------------  -+   +---------+------------------+-----+----------+-------+  Left    Lt Pressure (mmHg)IndexWaveform  Comment  +---------+------------------+-----+----------+-------+  Brachial 127                                       +---------+------------------+-----+----------+-------+  PTA     111               0.87 monophasic         +---------+------------------+-----+----------+-------+  DP      68                 0.54 monophasic         +---------+------------------+-----+----------+-------+  Great Toe60                0.47 Abnormal           +---------+------------------+-----+----------+-------+   +-------+----------------+-----------------+----------------+--------------  ----+  ABI/TBIToday's ABI     Today's TBI      Previous ABI    Previous TBI         +-------+----------------+-----------------+----------------+--------------  ----+  Right non-compressibleTransmetatarsal   non-compressibleTransmetatarsal                            Amputation                       Amputation           +-------+----------------+-----------------+----------------+--------------  ----+  Left  .87             .47              non-compressible.43                  +-------+----------------+-----------------+----------------+--------------  ----+         Right ABIs and left TBIs appear essentially unchanged compared to prior  study on 05/04/2022. Left ABIs appear decreased compared to prior study on  05/04/2022.    Summary:  Right: Resting right ankle-brachial index indicates noncompressible right  lower extremity arteries.   Transmetatarsal Amputation.  Left: Resting left ankle-brachial index indicates mild left lower  extremity arterial disease. The left toe-brachial index is abnormal.        Assessment/Plan:    73 year old male with history of end-stage renal disease and treatment of right lower extremity atherosclerosis of native arteries to heal transmetatarsal amputation which is now well-healed with adequate flow in the right lower extremity and no left lower extremity symptoms.  Unfortunately his bilateral upper extremity accesses have all thrombosed.  Patient does not want any further contrast given that he is had a code event in the past.  As such we will plan for right upper extremity AV graft on a nondialysis day in the near future and he will continue  dialysis through the catheter until the graft is ready for use.      Maeola Harman MD [facility]

## 2023-06-07 ENCOUNTER — Telehealth: Payer: Self-pay

## 2023-06-07 NOTE — Telephone Encounter (Signed)
 Attempted to call for surgery scheduling. Jon Anderson

## 2023-06-11 ENCOUNTER — Other Ambulatory Visit: Payer: Self-pay

## 2023-06-11 DIAGNOSIS — N186 End stage renal disease: Secondary | ICD-10-CM

## 2023-06-18 ENCOUNTER — Encounter (HOSPITAL_COMMUNITY): Payer: Self-pay | Admitting: Vascular Surgery

## 2023-06-18 ENCOUNTER — Other Ambulatory Visit: Payer: Self-pay

## 2023-06-18 NOTE — Progress Notes (Addendum)
 Anesthesia Chart Review: SAME DAY WORK-UP  Case: [id number] Date/Time: 06/19/23 1136   Procedure: INSERTION OF ARTERIOVENOUS (AV) GORE-TEX GRAFT ARM (Right)   Anesthesia type: Choice   Pre-op diagnosis: ESRD   [facility] ROOM 16 / [facility]   Surgeons: Maeola Harman, MD       DISCUSSION: Patient is a 73 year old male scheduled for the above procedure. His right brachial artery to basilic vein AVF creation placed a little over a year ago failed. He had previous failed access in his LUE. He is currently dialyzes via [facility] on MWF at [facility] location. New permanent HD access desired.     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   Last cardiology evaluation was on 12/27/21 by Bernadene Person, NP for follow-up CAD and PEA arrest on 11/15/21, likely from anaphylaxis related to contrast allergy despite premedication. CAD felt stable. No ischemic testing recommended at that time. He had carotid US in September 2024 per Dr. Allyson Sabal that showed stable mild-moderate carotid artery stenosis with 12 month follow-up recommended. His next office visit with Dr.  Allyson Sabal is scheduled for 06/26/23. Per RN phone interview, he denied CV symptoms and reported tolerating HD sessions.  Per VVS instructions, he was advised to hold Plavix for surgery after 06/13/23 dose which he did hold.    Anesthesia team to evaluate on the day of surgery.    VS:  BP Readings from Last 3 Encounters:  06/06/23 121/75  03/28/23 (!) 158/89  01/27/23 (!) 147/85   Pulse Readings from Last 3 Encounters:  06/06/23 75  03/28/23 67  01/27/23 70     PROVIDERS: - PCP has been Dr. Ralene Ok - Nephrologist is Dr. Terrial Rhodes.  - Cardiologist is Dr. Nanetta Batty. He also had seen cardiologist Pu, Dellis Filbert, MD with [facility] before and after his 2018 CABG, last visit 10/31/18 for consideration of cardiac testing for pre-renal transplant evaluation.    LABS: For day of surgery. As of 03/28/23, H/H 13.1/41.2, PLT 208, glucose 84.     IMAGES: CXR 03/28/23: FINDINGS: - Bilateral lung fields are clear. Bilateral costophrenic angles are clear. - Normal cardio-mediastinal silhouette. - No acute osseous abnormalities. - The soft tissues are within normal limits. - Left-sided hemodialysis catheter again seen with its tip overlying the cavoatrial junction region. IMPRESSION: No active cardiopulmonary disease.   EKG: 03/28/23: Normal sinus rhythm When compared with ECG of 26-Jan-2023 15:44, PREVIOUS ECG IS PRESENT Confirmed by Alvester Chou [phone]) on 03/28/2023 12:10:41 PM      CV: US Carotid 12/27/21: Summary:  - Right Carotid: Velocities in the right ICA are consistent with a 1-39% stenosis. Non-hemodynamically significant plaque <50% noted in the CCA.  - Left Carotid: Velocities in the left ICA are consistent with a 40-59%  stenosis. Non-hemodynamically significant  plaque <50% noted in the  CCA. The ECA appears >50% stenosed.  - Vertebrals:  Bilateral vertebral arteries demonstrate antegrade flow.  - Subclavians: Normal flow hemodynamics were seen in  bilateral subclavian arteries.     Echo 11/15/21: IMPRESSIONS   1. Left ventricular ejection fraction, by estimation, is >75%. The left  ventricle has hyperdynamic function. The left ventricle has no regional  wall motion abnormalities. There is moderate concentric left ventricular  hypertrophy. Left ventricular  diastolic parameters are consistent with Grade I diastolic dysfunction  (impaired relaxation). Elevated left atrial pressure.   2. Right ventricular systolic function is normal. The right ventricular  size is normal. Tricuspid regurgitation signal is inadequate for assessing  PA pressure.   3. Left atrial size was mildly dilated.   4. The mitral valve is degenerative. No evidence of mitral valve  regurgitation. Mild mitral stenosis. The mean mitral valve gradient is 7.0  mmHg with average heart rate of 118 bpm. Severe mitral annular  calcification.   5. The aortic valve is tricuspid. There is mild calcification of the  aortic valve. There is mild thickening of the aortic valve. Aortic valve  regurgitation is not visualized. Aortic valve sclerosis is present, with  no evidence of aortic valve stenosis.  - Comparison(s): No significant change from prior study. Prior images  reviewed side by side.      Cardiac cath 04/27/17: Prox LAD lesion is 75% stenosed. -Focal lesion that was previously described. LIMA-LAD is widely patent and is normal in caliber. There is competitive flow. Otherwise minimal disease throughout. Nothing made greater than 40% in the ostial circumflex. The left ventricular systolic function is normal. The left ventricular ejection fraction is 50-55% by visual estimate. LV end diastolic pressure is low. - ~0-3 mmHg There is no aortic valve stenosis. Angiographically no culprit lesion to explain the patient's symptoms.  He does have a significant LAD lesion which is a very focal lesion and easily stent pull, however there is a widely patent LIMA graft distally.   There is actually retrograde filling from the LIMA graft to the diagonal branch which would be the only branch jeopardized by the more upstream LAD lesion. Nothing to explain the patient's symptoms.  In fact, his LVEDP is very low after dialysis.  This would indicate that he was adequately dialyzed and argue against increased LVEDP causing microvascular ischemic symptoms.   Past Medical History:  Diagnosis Date   Anemia of chronic disease    Arthritis    CAD (coronary artery disease)    Carotid stenosis    a. <50% RICA, >70% LICA by duplex 07/2015   Chronic chest pain    occ   Constipation    chronic   Dyspnea    occasional with extertion   ESRD (end stage renal disease) on dialysis (HCC)    M-W-F- [name]   GERD (gastroesophageal reflux disease)    History of hiatal hernia    History of kidney stones    Passed   HNP (herniated nucleus pulposus), lumbar    HTN (hypertension)    echo 3/10: EF 60%, LAE   Hyperlipidemia    Mitral stenosis    mild MS 11/15/21   Nephrolithiasis    "passed them all"   PEA (Pulseless electrical activity) (HCC) 11/15/2021   PEA arrest felt likely related to contrast anaphylaxis despite premedication   Peripheral arterial disease (HCC)    a. s/p PTCA  right    Pneumonia yrs ago   Restless  legs    Sciatic leg pain    Sleep apnea    does not use cpap   Snores    a. presumed OSA, pt has refused sleep eval in past.   Type II diabetes mellitus (HCC)    no longer on medications, checks blood glucose at home   Urinary frequency    Uses wheelchair    Walker as ambulation aid     Past Surgical History:  Procedure Laterality Date   A/V FISTULAGRAM Left 01/10/2021   Procedure: A/V FISTULAGRAM;  Surgeon: Maeola Harman, MD;  Location: [facility] INVASIVE CV LAB;  Service: Cardiovascular;  Laterality: Left;   A/V FISTULAGRAM Left 05/19/2021   Procedure: A/V Fistulagram;  Surgeon: Cephus Shelling, MD;  Location: [facility] INVASIVE CV LAB;  Service:  Cardiovascular;  Laterality: Left;   ABDOMINAL AORTOGRAM W/LOWER EXTREMITY Bilateral 08/21/2019   Procedure: ABDOMINAL AORTOGRAM W/LOWER EXTREMITY;  Surgeon: Maeola Harman, MD;  Location: [facility] INVASIVE CV LAB;  Service: Cardiovascular;  Laterality: Bilateral;   AMPUTATION Right 07/25/2019   Procedure: RIGHT 5th RAY AMPUTATION;  Surgeon: Nadara Mustard, MD;  Location: [facility] OR;  Service: Orthopedics;  Laterality: Right;   AMPUTATION Right 11/19/2019   Procedure: RIGHT TRANSMETATARSAL AMPUTATION;  Surgeon: Nadara Mustard, MD;  Location: [facility] OR;  Service: Orthopedics;  Laterality: Right;   ANGIOPLASTY / STENTING FEMORAL Left 12/11/2013   dr berry   AV FISTULA PLACEMENT Left 03/19/2014   Procedure: CREATION OF ARTERIOVENOUS (AV) FISTULA  LEFT UPPER ARM;  Surgeon: Pryor Ochoa, MD;  Location: [facility] OR;  Service: Vascular;  Laterality: Left;   AV FISTULA PLACEMENT Left 11/09/2020   Procedure: LEFT ARM ARTERIOVENOUS (AV) FISTULA CREATION;  Surgeon: Maeola Harman, MD;  Location: [facility] OR;  Service: Vascular;  Laterality: Left;   AV FISTULA PLACEMENT Left 02/15/2021   Procedure: INSERTION OF LEFT ARM ARTERIOVENOUS GORE-TEX GRAFT;  Surgeon: Maeola Harman, MD;  Location: [facility] OR;  Service: Vascular;  Laterality: Left;   AV FISTULA PLACEMENT Right 02/16/2022   Procedure: RIGHT ARM BRACHIOCEPAHLIC ARTERIOVENOUS (AV) FISTULA CREATION;  Surgeon: Maeola Harman, MD;  Location: [facility] OR;  Service: Vascular;  Laterality: Right;  Regional and MAC   BACK SURGERY  01/2018   screws placed    BASCILIC VEIN TRANSPOSITION Left 12/14/2020   Procedure: REVISION OF LEFT ARM SECOND STAGE BASILIC VEIN TRANSPOSITION;  Surgeon: Maeola Harman, MD;  Location: [facility] OR;  Service: Vascular;  Laterality: Left;   BASCILIC VEIN TRANSPOSITION Right 04/18/2022   Procedure: RIGHT BRACHIOBASILIC FISTULA SECOND STAGE TRANSPOSITION;  Surgeon: Maeola Harman, MD;  Location: [facility] OR;  Service:  Vascular;  Laterality: Right;   CARDIAC CATHETERIZATION  2001 and 2010    COLONOSCOPY W/ BIOPSIES AND POLYPECTOMY     COLONOSCOPY WITH PROPOFOL N/A 08/01/2016   Procedure: COLONOSCOPY WITH PROPOFOL;  Surgeon: Jeani Hawking, MD;  Location: [facility];  Service: Endoscopy;  Laterality: N/A;   COLONOSCOPY WITH PROPOFOL N/A 01/17/2022   Procedure: COLONOSCOPY WITH PROPOFOL;  Surgeon: Jeani Hawking, MD;  Location: [facility];  Service: Gastroenterology;  Laterality: N/A;   CORONARY ARTERY BYPASS GRAFT  2019   [facility] x 1 bypass   ESOPHAGOGASTRODUODENOSCOPY (EGD) WITH PROPOFOL N/A 08/01/2016   Procedure: ESOPHAGOGASTRODUODENOSCOPY (EGD) WITH PROPOFOL;  Surgeon: Jeani Hawking, MD;  Location: [facility];  Service: Endoscopy;  Laterality: N/A;   FISTULA SUPERFICIALIZATION Left 02/10/2020   Procedure: 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;  Surgeon: Larina Earthly, MD;  Location: [facility] OR;  Service: Vascular;  Laterality: Left;   FISTULA SUPERFICIALIZATION Left 03/23/2020   Procedure: LEFT UPPER EXTREMITY ARTERIOVENOUS FISTULA PLICATION;  Surgeon: Larina Earthly, MD;  Location: [facility] OR;  Service: Vascular;  Laterality: Left;   FOOT FRACTURE SURGERY Right    ligament repair   FRACTURE SURGERY     left forearm   GRAFT APPLICATION Right 05/13/2019   Procedure: FAT GRAFT APPLICATION;  Surgeon: Park Liter, DPM;  Location: [facility];  Service: Podiatry;  Laterality: Right;   HEMATOMA EVACUATION Left 11/09/2020   Procedure: EVACUATION HEMATOMA LEFT ARM;  Surgeon: Chuck Hint, MD;  Location: [facility] OR;  Service: Vascular;  Laterality: Left;   INGUINAL HERNIA REPAIR Left    IR AV DIALY SHUNT INTRO NEEDLE/INTRACATH INITIAL W/PTA/IMG LEFT  11/15/2021   IR AV DIALY SHUNT INTRO NEEDLE/INTRACATH INITIAL W/PTA/IMG RIGHT Right 08/15/2022   IR FLUORO GUIDE CV LINE LEFT  01/26/2023   IR FLUORO GUIDE CV LINE RIGHT  01/20/2022   IR FLUORO GUIDE CV LINE RIGHT  05/03/2022   IR REMOVAL  TUN CV CATH W/O FL  06/16/2021   IR REMOVAL TUN CV CATH W/O FL  07/06/2022   IR US GUIDE VASC ACCESS LEFT  11/15/2021   IR US GUIDE VASC ACCESS LEFT  11/15/2021   IR US GUIDE VASC ACCESS LEFT  01/26/2023   IR US GUIDE VASC ACCESS RIGHT  11/15/2021   IR US GUIDE VASC ACCESS RIGHT  01/20/2022   IR US GUIDE VASC ACCESS RIGHT  08/15/2022   LEFT HEART CATH AND CORS/GRAFTS ANGIOGRAPHY N/A 04/27/2017   Procedure: LEFT HEART CATH AND CORS/GRAFTS ANGIOGRAPHY;  Surgeon: Marykay Lex, MD;  Location: [facility] INVASIVE CV LAB;  Service: Cardiovascular;  Laterality: N/A;   LEFT HEART CATHETERIZATION WITH CORONARY ANGIOGRAM N/A 06/22/2014   Procedure: LEFT HEART CATHETERIZATION WITH CORONARY ANGIOGRAM;  Surgeon: Lennette Bihari, MD;  Location: [facility] CATH LAB;  Service: Cardiovascular;  Laterality: N/A;   LOWER EXTREMITY ANGIOGRAM Left 12/11/2013   Procedure: LOWER EXTREMITY ANGIOGRAM;  Surgeon: Runell Gess, MD;  Location: [facility] CATH LAB;  Service: Cardiovascular;  Laterality: Left;   LUMBAR LAMINECTOMY/DECOMPRESSION MICRODISCECTOMY Right 07/03/2017   Procedure: MICRODISCECTOMY LUMBAR FIVE - SACRAL ONE RIGHT;  Surgeon: Lisbeth Renshaw, MD;  Location[facility];  Service: Neurosurgery;  Laterality: Right;   LUMBAR LAMINECTOMY/DECOMPRESSION MICRODISCECTOMY Right 10/19/2017   Procedure: MICRODISCECTOMY LUMBAR FIVE- SACRAL 1 ONE ;  Surgeon: Lisbeth Renshaw, MD;  [facility];  Service: Neurosurgery;  Laterality: Right;   PERIPHERAL VASCULAR BALLOON ANGIOPLASTY Left 05/19/2021   Procedure: PERIPHERAL VASCULAR BALLOON ANGIOPLASTY;  Surgeon: Cephus Shelling, MD;  [facility] INVAS[facility] CV LAB;  Service: Cardiovascular;  Laterality: Left;   PERIPHERAL VASCULAR INTERVENTION Left 01/10/2021   Procedure: PERIPHERAL VASCULAR INTERVENTION;  Surgeon: Maeola Harman, MD;  Location: [facility] INVASIVE CV LAB;  Service: Cardiovascular;  Laterality: Left;   POLYPECTOMY  01/17/2022   Procedure: POLYPECTOMY;  Surgeon: Jeani Hawking, MD;   Location: [facility];  Service: Gastroenterology;;   THROMBECTOMY W/ EMBOLECTOMY Left 11/09/2020   Procedure: THROMBECTOMY OF LEFT ARTERIOVENOUS FISTULA;  Surgeon: Chuck Hint, MD;  Location: [facility] OR;  Service: Vascular;  Laterality: Left;   TONSILLECTOMY AND ADENOIDECTOMY     WISDOM TOOTH EXTRACTION     WOUND DEBRIDEMENT Right 05/13/2019   Procedure: DEBRIDEMENT WOUND;  Surgeon: Park Liter, DPM;  Location: [facility];  Service: Podiatry;  Laterality: Right;    MEDICATIONS: No current facility-administered medications for this encounter.  aspirin EC 81 MG tablet   calcium acetate (PHOSLO) 667 MG capsule   clopidogrel (PLAVIX) 75 MG tablet   gabapentin (NEURONTIN) 100 MG capsule   multivitamin (RENA-VIT) TABS tablet   pantoprazole (PROTONIX) 40 MG tablet    Shonna Chock, PA-C Surgical Short Stay/Anesthesiology [facility] Phone [phone] [facility] Phone [phone] 06/18/2023 3:10 PM

## 2023-06-18 NOTE — Progress Notes (Signed)
 SDW call  Patient was given pre-op instructions over the phone. Patient verbalized understanding of instructions provided.     PCP - Dr. Ralene Ok Cardiologist - Dr. Nanetta Batty Pulmonary:    PPM/ICD - denies Device Orders - na Rep Notified - na   Chest x-ray - 12/18/20224 EKG -  04/02/2023 Stress Test - 08/22/2010 ECHO - 11/15/2021 Cardiac Cath - 04/27/2017  Sleep Study/sleep apnea/CPAP:  diagnosed with sleep apnea, does not use a CPAP  Type II diabetic, diet controlled. Last A1C 7.6 2023 Fasting Blood sugar range: 90-125 How often check sugars: TID   Blood Thinner Instructions: Plavix, states last dose 06/13/2023 Aspirin Instructions:continue   ERAS Protcol - NPO   Anesthesia review: Yes. PEA arrest, HTN, CAD, PVD, DM, OSA, ESRD with dialysis   Patient denies shortness of breath, fever, cough and chest pain over the phone call  Your procedure is scheduled on Tuesday June 19, 2023  Report to [facility] Main Entrance "A" at  0920  A.M., then check in with the Admitting office.  Call this number if you have problems the morning of surgery:  [phone]   If you have any questions prior to your surgery date call [phone]: Open Monday-Friday 8am-4pm If you experience any cold or flu symptoms such as cough, fever, chills, shortness of breath, etc. between now and your scheduled surgery, please notify us at the above number    Remember:  Do not eat or drink after midnight the night before your surgery  Take these medicines the morning of surgery with A SIP OF WATER:  Gabapentin, protonix, ASA  As of today, STOP taking any Aleve, Naproxen, Ibuprofen, Motrin, Advil, Goody's, BC's, all herbal medications, fish oil, and all vitamins.

## 2023-06-18 NOTE — Anesthesia Preprocedure Evaluation (Signed)
 Anesthesia Evaluation  Patient identified by MRN, date of birth, ID band Patient awake    Reviewed: Allergy & Precautions, NPO status , Patient's Chart, lab work & pertinent test results  Airway Mallampati: II  TM Distance: >3 FB Neck ROM: Full    Dental no notable dental hx.    Pulmonary sleep apnea , former smoker   Pulmonary exam normal        Cardiovascular hypertension, + CAD, + Peripheral Vascular Disease, +CHF and + Orthopnea   Rhythm:Regular Rate:Normal     Neuro/Psych negative neurological ROS  negative psych ROS   GI/Hepatic Neg liver ROS, hiatal hernia,GERD  Medicated,,  Endo/Other  diabetes, Type 2    Renal/GU   negative genitourinary   Musculoskeletal  (+) Arthritis , Osteoarthritis,    Abdominal Normal abdominal exam  (+)   Peds  Hematology  (+) Blood dyscrasia, anemia   Anesthesia Other Findings   Reproductive/Obstetrics                             Anesthesia Physical Anesthesia Plan  ASA: 3  Anesthesia Plan: MAC and Regional   Post-op Pain Management: Regional block*   Induction: Intravenous  PONV Risk Score and Plan: 1 and Ondansetron, Dexamethasone, Propofol infusion and Treatment may vary due to age or medical condition  Airway Management Planned: Simple Face Mask and Nasal Cannula  Additional Equipment: None  Intra-op Plan:   Post-operative Plan:   Informed Consent: I have reviewed the patients History and Physical, chart, labs and discussed the procedure including the risks, benefits and alternatives for the proposed anesthesia with the patient or authorized representative who has indicated his/her understanding and acceptance.     Dental advisory given  Plan Discussed with: CRNA  Anesthesia Plan Comments: (PAT note written 06/18/2023 by Shonna Chock, PA-C.  )       Anesthesia Quick Evaluation

## 2023-06-19 ENCOUNTER — Ambulatory Visit (HOSPITAL_COMMUNITY)
Admission: RE | Admit: 2023-06-19 | Discharge: 2023-06-19 | Disposition: A | Discharge status: Home / Self Care | Attending: Vascular Surgery | Admitting: Vascular Surgery

## 2023-06-19 ENCOUNTER — Encounter (HOSPITAL_COMMUNITY): Payer: Self-pay | Admitting: Vascular Surgery

## 2023-06-19 ENCOUNTER — Other Ambulatory Visit: Payer: Self-pay

## 2023-06-19 ENCOUNTER — Other Ambulatory Visit (HOSPITAL_COMMUNITY): Payer: Self-pay

## 2023-06-19 ENCOUNTER — Ambulatory Visit (HOSPITAL_BASED_OUTPATIENT_CLINIC_OR_DEPARTMENT_OTHER): Admitting: Vascular Surgery

## 2023-06-19 ENCOUNTER — Encounter (HOSPITAL_COMMUNITY)
Admission: RE | Disposition: A | Discharge status: Home / Self Care | Payer: Self-pay | Source: Home / Self Care | Attending: Vascular Surgery

## 2023-06-19 ENCOUNTER — Ambulatory Visit (HOSPITAL_COMMUNITY): Admitting: Vascular Surgery

## 2023-06-19 DIAGNOSIS — Z91041 Radiographic dye allergy status: Secondary | ICD-10-CM | POA: Diagnosis not present

## 2023-06-19 DIAGNOSIS — I5033 Acute on chronic diastolic (congestive) heart failure: Secondary | ICD-10-CM

## 2023-06-19 DIAGNOSIS — I083 Combined rheumatic disorders of mitral, aortic and tricuspid valves: Secondary | ICD-10-CM | POA: Insufficient documentation

## 2023-06-19 DIAGNOSIS — I2510 Atherosclerotic heart disease of native coronary artery without angina pectoris: Secondary | ICD-10-CM | POA: Insufficient documentation

## 2023-06-19 DIAGNOSIS — G4733 Obstructive sleep apnea (adult) (pediatric): Secondary | ICD-10-CM | POA: Insufficient documentation

## 2023-06-19 DIAGNOSIS — D649 Anemia, unspecified: Secondary | ICD-10-CM | POA: Diagnosis not present

## 2023-06-19 DIAGNOSIS — E785 Hyperlipidemia, unspecified: Secondary | ICD-10-CM | POA: Diagnosis not present

## 2023-06-19 DIAGNOSIS — Z87891 Personal history of nicotine dependence: Secondary | ICD-10-CM | POA: Insufficient documentation

## 2023-06-19 DIAGNOSIS — E1151 Type 2 diabetes mellitus with diabetic peripheral angiopathy without gangrene: Secondary | ICD-10-CM | POA: Insufficient documentation

## 2023-06-19 DIAGNOSIS — Z992 Dependence on renal dialysis: Secondary | ICD-10-CM

## 2023-06-19 DIAGNOSIS — G4730 Sleep apnea, unspecified: Secondary | ICD-10-CM | POA: Insufficient documentation

## 2023-06-19 DIAGNOSIS — Z7902 Long term (current) use of antithrombotics/antiplatelets: Secondary | ICD-10-CM | POA: Diagnosis not present

## 2023-06-19 DIAGNOSIS — K449 Diaphragmatic hernia without obstruction or gangrene: Secondary | ICD-10-CM | POA: Diagnosis not present

## 2023-06-19 DIAGNOSIS — Z951 Presence of aortocoronary bypass graft: Secondary | ICD-10-CM | POA: Insufficient documentation

## 2023-06-19 DIAGNOSIS — E1122 Type 2 diabetes mellitus with diabetic chronic kidney disease: Secondary | ICD-10-CM | POA: Diagnosis not present

## 2023-06-19 DIAGNOSIS — K219 Gastro-esophageal reflux disease without esophagitis: Secondary | ICD-10-CM | POA: Insufficient documentation

## 2023-06-19 DIAGNOSIS — Z89431 Acquired absence of right foot: Secondary | ICD-10-CM | POA: Diagnosis not present

## 2023-06-19 DIAGNOSIS — N186 End stage renal disease: Secondary | ICD-10-CM

## 2023-06-19 DIAGNOSIS — I132 Hypertensive heart and chronic kidney disease with heart failure and with stage 5 chronic kidney disease, or end stage renal disease: Secondary | ICD-10-CM | POA: Diagnosis not present

## 2023-06-19 DIAGNOSIS — N185 Chronic kidney disease, stage 5: Secondary | ICD-10-CM

## 2023-06-19 DIAGNOSIS — I5032 Chronic diastolic (congestive) heart failure: Secondary | ICD-10-CM | POA: Diagnosis not present

## 2023-06-19 DIAGNOSIS — Z8674 Personal history of sudden cardiac arrest: Secondary | ICD-10-CM | POA: Insufficient documentation

## 2023-06-19 HISTORY — PX: AV FISTULA PLACEMENT: SHX1204

## 2023-06-19 LAB — POCT I-STAT, CHEM 8
BUN: 35 mg/dL — ABNORMAL HIGH (ref 8–23)
Calcium, Ion: 1.09 mmol/L — ABNORMAL LOW (ref 1.15–1.40)
Chloride: 101 mmol/L (ref 98–111)
Creatinine, Ser: 9.8 mg/dL — ABNORMAL HIGH (ref 0.61–1.24)
Glucose, Bld: 97 mg/dL (ref 70–99)
HCT: 41 % (ref 39.0–52.0)
Hemoglobin: 13.9 g/dL (ref 13.0–17.0)
Potassium: 5.4 mmol/L — ABNORMAL HIGH (ref 3.5–5.1)
Sodium: 132 mmol/L — ABNORMAL LOW (ref 135–145)
TCO2: 27 mmol/L (ref 22–32)

## 2023-06-19 LAB — GLUCOSE, CAPILLARY
Glucose-Capillary: 99 mg/dL (ref 70–99)
Glucose-Capillary: 147 mg/dL — ABNORMAL HIGH (ref 70–99)
Glucose-Capillary: 146 mg/dL — ABNORMAL HIGH (ref 70–99)

## 2023-06-19 SURGERY — INSERTION OF ARTERIOVENOUS (AV) GORE-TEX GRAFT ARM
Anesthesia: Monitor Anesthesia Care | Site: Arm Upper | Laterality: Right

## 2023-06-19 MED ORDER — CHLORHEXIDINE GLUCONATE 4 % EX SOLN: 60.0000 mL | Freq: Once | CUTANEOUS | Status: DC

## 2023-06-19 MED ORDER — LIDOCAINE-EPINEPHRINE (PF) 1.5 %-1:200000 IJ SOLN
INTRAMUSCULAR | Status: DC | PRN
  Administered 2023-06-19: 20 mL via PERINEURAL

## 2023-06-19 MED ORDER — 0.9 % SODIUM CHLORIDE (POUR BTL) OPTIME
TOPICAL | Status: DC | PRN
  Administered 2023-06-19: 1000 mL

## 2023-06-19 MED ORDER — ORAL CARE MOUTH RINSE: 15.0000 mL | Freq: Once | OROMUCOSAL | Status: AC

## 2023-06-19 MED ORDER — PROPOFOL 500 MG/50ML IV EMUL
INTRAVENOUS | Status: DC | PRN
  Administered 2023-06-19: 50 ug/kg/min via INTRAVENOUS

## 2023-06-19 MED ORDER — HEMOSTATIC AGENTS (NO CHARGE) OPTIME
TOPICAL | Status: DC | PRN
  Administered 2023-06-19: 1 via TOPICAL

## 2023-06-19 MED ORDER — FENTANYL CITRATE (PF) 100 MCG/2ML IJ SOLN
INTRAMUSCULAR | Status: DC | PRN
Start: 2023-06-19 — End: 2023-06-19
  Administered 2023-06-19: 50 ug via INTRAVENOUS

## 2023-06-19 MED ORDER — HEPARIN 6000 UNIT IRRIGATION SOLUTION
Status: AC
  Filled 2023-06-19: qty 500

## 2023-06-19 MED ORDER — CEFAZOLIN SODIUM-DEXTROSE 2-4 GM/100ML-% IV SOLN
2.0000 g | INTRAVENOUS | Status: AC
Start: 2023-06-19 — End: 2023-06-19
  Administered 2023-06-19: 2 g via INTRAVENOUS
  Filled 2023-06-19: qty 100

## 2023-06-19 MED ORDER — PHENYLEPHRINE HCL-NACL 20-0.9 MG/250ML-% IV SOLN
INTRAVENOUS | Status: DC | PRN
Start: 2023-06-19 — End: 2023-06-19
  Administered 2023-06-19: 15 ug/min via INTRAVENOUS

## 2023-06-19 MED ORDER — LIDOCAINE HCL (PF) 1 % IJ SOLN
INTRAMUSCULAR | Status: AC
  Filled 2023-06-19: qty 30

## 2023-06-19 MED ORDER — SODIUM CHLORIDE 0.9 % IV SOLN
INTRAVENOUS | Status: DC
Start: 2023-06-19 — End: 2023-06-19

## 2023-06-19 MED ORDER — LIDOCAINE HCL (PF) 1 % IJ SOLN
INTRAMUSCULAR | Status: DC | PRN
  Administered 2023-06-19: 7 mL

## 2023-06-19 MED ORDER — HEPARIN 6000 UNIT IRRIGATION SOLUTION
Status: DC | PRN
  Administered 2023-06-19: 1

## 2023-06-19 MED ORDER — MIDAZOLAM HCL 2 MG/2ML IJ SOLN
INTRAMUSCULAR | Status: AC
  Filled 2023-06-19: qty 2

## 2023-06-19 MED ORDER — HEPARIN SODIUM (PORCINE) 1000 UNIT/ML IJ SOLN
INTRAMUSCULAR | Status: AC
  Filled 2023-06-19: qty 10

## 2023-06-19 MED ORDER — SODIUM CHLORIDE 0.9% FLUSH: 3.0000 mL | INTRAVENOUS | Status: DC | PRN

## 2023-06-19 MED ORDER — OXYCODONE-ACETAMINOPHEN 5-325 MG PO TABS
1.0000 | ORAL_TABLET | Freq: Four times a day (QID) | ORAL | 0 refills | Status: DC | PRN
  Filled 2023-06-19: qty 20, 5d supply, fill #0

## 2023-06-19 MED ORDER — HEPARIN SODIUM (PORCINE) 1000 UNIT/ML IJ SOLN
INTRAMUSCULAR | Status: DC | PRN
  Administered 2023-06-19: 3000 [IU] via INTRAVENOUS

## 2023-06-19 MED ORDER — FENTANYL CITRATE (PF) 100 MCG/2ML IJ SOLN
INTRAMUSCULAR | Status: AC
  Filled 2023-06-19: qty 2

## 2023-06-19 MED ORDER — CHLORHEXIDINE GLUCONATE 0.12 % MT SOLN
15.0000 mL | Freq: Once | OROMUCOSAL | Status: AC
  Administered 2023-06-19: 15 mL via OROMUCOSAL
  Filled 2023-06-19: qty 15

## 2023-06-19 MED ORDER — SODIUM CHLORIDE 0.9% FLUSH: 3.0000 mL | Freq: Two times a day (BID) | INTRAVENOUS | Status: DC

## 2023-06-19 SURGICAL SUPPLY — 33 items
ARMBAND PINK RESTRICT EXTREMIT (MISCELLANEOUS) ×2 IMPLANT
BAG COUNTER SPONGE SURGICOUNT (BAG) ×1 IMPLANT
CANISTER SUCT 3000ML PPV (MISCELLANEOUS) ×1 IMPLANT
CLIP TI MEDIUM 24 (CLIP) ×1 IMPLANT
CLIP TI MEDIUM 6 (CLIP) ×1 IMPLANT
CLIP TI WIDE RED SMALL 24 (CLIP) ×1 IMPLANT
CLIP TI WIDE RED SMALL 6 (CLIP) ×1 IMPLANT
DERMABOND ADVANCED .7 DNX12 (GAUZE/BANDAGES/DRESSINGS) ×1 IMPLANT
ELECT REM PT RETURN 9FT ADLT (ELECTROSURGICAL) ×1 IMPLANT
ELECTRODE REM PT RTRN 9FT ADLT (ELECTROSURGICAL) ×1 IMPLANT
GLOVE BIOGEL PI IND STRL 7.0 (GLOVE) ×1 IMPLANT
GOWN STRL REUS W/ TWL LRG LVL3 (GOWN DISPOSABLE) ×2 IMPLANT
GOWN STRL REUS W/ TWL XL LVL3 (GOWN DISPOSABLE) ×1 IMPLANT
GRAFT GORETEX STRT 4-7X45 (Vascular Products) IMPLANT
HEMOSTAT SNOW SURGICEL 2X4 (HEMOSTASIS) IMPLANT
INSERT FOGARTY SM (MISCELLANEOUS) ×1 IMPLANT
KIT BASIN OR (CUSTOM PROCEDURE TRAY) ×1 IMPLANT
KIT TURNOVER KIT B (KITS) ×1 IMPLANT
LOOP VESSEL MINI RED (MISCELLANEOUS) IMPLANT
NS IRRIG 1000ML POUR BTL (IV SOLUTION) ×1 IMPLANT
PACK CV ACCESS (CUSTOM PROCEDURE TRAY) ×1 IMPLANT
PAD ARMBOARD 7.5X6 YLW CONV (MISCELLANEOUS) ×2 IMPLANT
POWDER SURGICEL 3.0 GRAM (HEMOSTASIS) IMPLANT
SLING ARM FOAM STRAP LRG (SOFTGOODS) IMPLANT
SLING ARM FOAM STRAP MED (SOFTGOODS) IMPLANT
SUT MNCRL AB 4-0 PS2 18 (SUTURE) IMPLANT
SUT PROLENE 6 0 BV (SUTURE) IMPLANT
SUT SILK 2 0 SH (SUTURE) IMPLANT
SUT VIC AB 3-0 SH 27X BRD (SUTURE) ×2 IMPLANT
SYR TOOMEY 50ML (SYRINGE) IMPLANT
TOWEL GREEN STERILE (TOWEL DISPOSABLE) ×1 IMPLANT
UNDERPAD 30X36 HEAVY ABSORB (UNDERPADS AND DIAPERS) ×1 IMPLANT
WATER STERILE IRR 1000ML POUR (IV SOLUTION) ×1 IMPLANT

## 2023-06-19 NOTE — Anesthesia Procedure Notes (Signed)
 Anesthesia Regional Block: Supraclavicular block   Pre-Anesthetic Checklist: , timeout performed,  Correct Patient, Correct Site, Correct Laterality,  Correct Procedure, Correct Position, site marked,  Risks and benefits discussed,  Surgical consent,  Pre-op evaluation,  At surgeon's request and post-op pain management  Laterality: Right  Prep: Dura Prep       Needles:  Injection technique: Single-shot  Needle Type: Echogenic Stimulator Needle     Needle Length: 5cm  Needle Gauge: 20     Additional Needles:   Procedures:,,,, ultrasound used (permanent image in chart),,    Narrative:  Start time: 06/19/2023 9:10 AM End time: 06/19/2023 9:15 AM Injection made incrementally with aspirations every 5 mL.  Performed by: Personally  Anesthesiologist: Atilano Median, DO  Additional Notes: Patient identified. Risks/Benefits/Options discussed with patient including but not limited to bleeding, infection, nerve damage, failed block, incomplete pain control. Patient expressed understanding and wished to proceed. All questions were answered. Sterile technique was used throughout the entire procedure. Please see nursing notes for vital signs. Aspirated in 5cc intervals with injection for negative confirmation. Patient was given instructions on fall risk and not to get out of bed. All questions and concerns addressed with instructions to call with any issues or inadequate analgesia.

## 2023-06-19 NOTE — Interval H&P Note (Signed)
 History and Physical Interval Note:  06/19/2023 8:50 AM  Jon Anderson  has presented today for surgery, with the diagnosis of ESRD.  The various methods of treatment have been discussed with the patient and family. After consideration of risks, benefits and other options for treatment, the patient has consented to  Procedure(s): INSERTION OF ARTERIOVENOUS (AV) GORE-TEX GRAFT ARM (Right) as a surgical intervention.  The patient's history has been reviewed, patient examined, no change in status, stable for surgery.  I have reviewed the patient's chart and labs.  Questions were answered to the patient's satisfaction.     [name]

## 2023-06-19 NOTE — Op Note (Signed)
 OPERATIVE NOTE  PROCEDURE:   Intraoperative right arm vein mapping right arm brachial - axillary AVG creation  PRE-OPERATIVE DIAGNOSIS: ESRD  POST-OPERATIVE DIAGNOSIS: same as above   SURGEON: [name] MD  ASSISTANT(S): Doreatha Massed, PA  Given the complexity of the case,  the assistant was necessary in order to expedient the procedure and safely perform the technical aspects of the operation.  The assistant provided traction and countertraction to assist with exposure of the artery and vein.  They also assisted with suture ligation of multiple venous branches. They also assisted with tunneling of the graft.  They played a critical role for both anastomoses.. These skills, especially following the Prolene suture for the anastomosis, could not have been adequately performed by a scrub tech assistant.  ANESTHESIA: regional  ESTIMATED BLOOD LOSS: 20 cc  FINDING(S): No adequately sized superficial veins of the right arm Palpable and doppler thrill in AVG with multiphasic radial artery on completion  SPECIMEN(S):  none  INDICATIONS:   Jon Anderson is a 73 y.o. male with ESRD. The patient is currently on dialysis via [facility]. They were seen in the office for evaluation of hemodialysis access. The risks an benefits including of access creation were reviewed including: need for additional procedures, need for additional creations, steal, ischemia monomelic neuropathy, failure of access, and bleeding. The patient expressed understand and is willing to proceed.    DESCRIPTION: The patient was brought to the operating room positioned supine on the operating table.  The right arm was prepped and draped in usual sterile fashion.  Timeout was performed and preoperative antibiotics were administered.  Intraoperative vein mapping of the arm was performed which confirmed the preoperative vein mapping and demonstrated no adequately sized superficial veins of the right arm.  The right  brachial vein at the axilla was exposed using longitudinal incision.  Incision was carried down until the brachial sheath was encountered.  The brachial vein was identified, exposed, encircled with a Silastic Vesseloop. The brachial artery was exposed using a longitudinal incision in the distal arm just above the antecubital fossa.  Incision was carried down through subcutaneous tissue until the brachial sheath was encountered.  This was incised sharply.  The brachial artery was exposed and encircled with a Silastic Vesseloop. Using a curved, sheathed tunneling device, a 4-7 mm tapered Gore-Tex graft was tunneled subcutaneously and gentle arc across the biceps of the left arm.  The patient was then heparinized with 3000 units of IV heparin.   The brachial artery was clamped proximally and distally.  An anterior arteriotomy was made with an 11 blade.  This was extended with Potts scissors.  The 4 mm end of the Gore-Tex graft was spatulated and then anastomosed end-to-side to the brachial arteriotomy using continuous running suture of 6-0 Prolene.  The anastomosis was completed and hemostasis ensured.  The graft was clamped to restore perfusion to the hand. The brachial vein was clamped proximally and distally.  An anterior venotomy was made with an 11 blade.  This was extended with Potts scissors.  The 7 mm end of the Gore-Tex graft was then anastomosed end to side to the brachial vein venotomy using continuous running suture of 6-0 Prolene. Immediately prior to completion the anastomosis was de-aired and flushed.  Anastomosis was then completed.  Hemostasis was insured.   Doppler machine was brought onto the field to interrogate the graft.  Excellent doppler flow was noted in the radial artery. Distal to the venous anastomosis a  Doppler bruit was heard. The incisions were copiously irrigated and then closed in layers with 3-0 Vicryl and 4-0 Monocryl for the skin. Dermabond was applied. All counts were correct  at the end of the case. The patient tolerated the procedure well and was brought to recovery in stable condition.    COMPLICATIONS: none apparent  CONDITION: stable  [name] MD [facility] Phone Number: [phone] 06/19/2023 11:33 AM

## 2023-06-19 NOTE — Transfer of Care (Signed)
 Immediate Anesthesia Transfer of Care Note  Patient: Jon Anderson  Procedure(s) Performed: INSERTION OF ARTERIOVENOUS (AV) GORE-TEX GRAFT RIGHT ARM (Right: Arm Upper)  Patient Location: PACU  Anesthesia Type:MAC  Level of Consciousness: awake and oriented  Airway & Oxygen Therapy: Patient Spontanous Breathing  Post-op Assessment: Report given to RN and Post -op Vital signs reviewed and stable  Post vital signs: Reviewed and stable  Last Vitals:  Vitals Value Taken Time  BP 128/72 06/19/23 1125  Temp 36.4 C 06/19/23 1125  Pulse 80 06/19/23 1127  Resp 19 06/19/23 1127  SpO2 94 % 06/19/23 1127  Vitals shown include unfiled device data.  Last Pain:  Vitals:   06/19/23 0900  TempSrc:   PainSc: 0-No pain         Complications: No notable events documented.

## 2023-06-19 NOTE — Discharge Instructions (Signed)
   [facility]  Discharge Instructions  AV Fistula or Graft Surgery for Dialysis Access  Please refer to the following instructions for your post-procedure care. Your surgeon or physician assistant will discuss any changes with you.  Activity  You may drive the day following your surgery, if you are comfortable and no longer taking prescription pain medication. Resume full activity as the soreness in your incision resolves.  Bathing/Showering  You may shower after you go home. Keep your incision dry for 48 hours. Do not soak in a bathtub, hot tub, or swim until the incision heals completely. You may not shower if you have a hemodialysis catheter.  Incision Care  Clean your incision with mild soap and water after 48 hours. Pat the area dry with a clean towel. You do not need a bandage unless otherwise instructed. Do not apply any ointments or creams to your incision. You may have skin glue on your incision. Do not peel it off. It will come off on its own in about one week. Your arm may swell a bit after surgery. To reduce swelling use pillows to elevate your arm so it is above your heart. Your doctor will tell you if you need to lightly wrap your arm with an ACE bandage.  Diet  Resume your normal diet. There are not special food restrictions following this procedure. In order to heal from your surgery, it is CRITICAL to get adequate nutrition. Your body requires vitamins, minerals, and protein. Vegetables are the best source of vitamins and minerals. Vegetables also provide the perfect balance of protein. Processed food has little nutritional value, so try to avoid this.  Medications  Resume taking all of your medications. If your incision is causing pain, you may take over-the counter pain relievers such as acetaminophen (Tylenol). If you were prescribed a stronger pain medication, please be aware these medications can cause nausea and constipation. Prevent  nausea by taking the medication with a snack or meal. Avoid constipation by drinking plenty of fluids and eating foods with high amount of fiber, such as fruits, vegetables, and grains.  Do not take Tylenol if you are taking prescription pain medications.  Follow up Your surgeon may want to see you in the office following your access surgery. If so, this will be arranged at the time of your surgery.  Please call us immediately for any of the following conditions:  Increased pain, redness, drainage (pus) from your incision site Fever of 101 degrees or higher Severe or worsening pain at your incision site Hand pain or numbness.  Reduce your risk of vascular disease:  Stop smoking. If you would like help, call [facility] at 1-800-QUIT-NOW (6781378368) or [place] at [phone]  Manage your cholesterol Maintain a desired weight Control your diabetes Keep your blood pressure down  Dialysis  It will take several weeks to several months for your new dialysis access to be ready for use. Your surgeon will determine when it is okay to use it. Your nephrologist will continue to direct your dialysis. You can continue to use your Permcath until your new access is ready for use.   06/19/2023 Jon Anderson [id number] 08/25/1950  Surgeon(s): [name], MD  Procedure(s): INSERTION OF ARTERIOVENOUS (AV) GORE-TEX GRAFT RIGHT ARM  x Do not stick graft for 4 weeks    If you have any questions, please call the office at [phone].

## 2023-06-20 ENCOUNTER — Encounter (HOSPITAL_COMMUNITY): Payer: Self-pay | Admitting: Vascular Surgery

## 2023-06-20 NOTE — Anesthesia Postprocedure Evaluation (Signed)
 Anesthesia Post Note  Patient: Jon Anderson  Procedure(s) Performed: INSERTION OF ARTERIOVENOUS (AV) GORE-TEX GRAFT RIGHT ARM (Right: Arm Upper)     Patient location during evaluation: PACU Anesthesia Type: Regional and MAC Level of consciousness: awake and alert Pain management: pain level controlled Vital Signs Assessment: post-procedure vital signs reviewed and stable Respiratory status: spontaneous breathing, nonlabored ventilation, respiratory function stable and patient connected to nasal cannula oxygen Cardiovascular status: stable and blood pressure returned to baseline Postop Assessment: Anderson apparent nausea or vomiting Anesthetic complications: Anderson   Anderson notable events documented.  Last Vitals:  Vitals:   06/19/23 1215 06/19/23 1230  BP: 121/68 128/72  Pulse: 68 74  Resp: 16 18  Temp:  36.4 C  SpO2: 95% 93%    Last Pain:  Vitals:   06/19/23 1230  TempSrc:   PainSc: 0-Anderson pain                 Earl Lites P Selso Mannor

## 2023-06-26 ENCOUNTER — Ambulatory Visit: Payer: Medicare Other | Attending: Cardiovascular Disease | Admitting: Cardiovascular Disease

## 2023-06-26 ENCOUNTER — Encounter: Payer: Self-pay | Admitting: Cardiovascular Disease

## 2023-06-26 VITALS — BP 100/70 | HR 87 | Ht 69.0 in | Wt 171.0 lb

## 2023-06-26 DIAGNOSIS — I739 Peripheral vascular disease, unspecified: Secondary | ICD-10-CM | POA: Diagnosis not present

## 2023-06-26 DIAGNOSIS — I10 Essential (primary) hypertension: Secondary | ICD-10-CM | POA: Diagnosis not present

## 2023-06-26 DIAGNOSIS — I6522 Occlusion and stenosis of left carotid artery: Secondary | ICD-10-CM

## 2023-06-26 DIAGNOSIS — Z951 Presence of aortocoronary bypass graft: Secondary | ICD-10-CM | POA: Diagnosis not present

## 2023-06-26 DIAGNOSIS — E785 Hyperlipidemia, unspecified: Secondary | ICD-10-CM

## 2023-06-26 LAB — LIPID PANEL
Chol/HDL Ratio: 4.2 ratio (ref 0.0–5.0)
Cholesterol, Total: 214 mg/dL — ABNORMAL HIGH (ref 100–199)
HDL: 51 mg/dL (ref >39)
LDL Chol Calc (NIH): 138 mg/dL — ABNORMAL HIGH (ref 0–99)
Triglycerides: 139 mg/dL (ref 0–149)
VLDL Cholesterol Cal: 25 mg/dL (ref 5–40)

## 2023-06-26 LAB — HEPATIC FUNCTION PANEL
ALT: 4 IU/L (ref 0–44)
AST: 12 IU/L (ref 0–40)
Albumin: 4.3 g/dL (ref 3.8–4.8)
Alkaline Phosphatase: 94 IU/L (ref 44–121)
Bilirubin Total: 0.3 mg/dL (ref 0.0–1.2)
Bilirubin, Direct: 0.09 mg/dL (ref 0.00–0.40)
Total Protein: 6.8 g/dL (ref 6.0–8.5)

## 2023-06-26 NOTE — Assessment & Plan Note (Signed)
 History of CAD status post off-pump LIMA to the LAD October 2018.  Cardiac cath performed Dr. Herbie Baltimore 04/27/2017 during a workup for renal transplant showed a patent LIMA to the LAD but otherwise no significant CAD.  He denies chest pain but does have chronic shortness of breath.

## 2023-06-26 NOTE — Patient Instructions (Signed)
 Medication Instructions:  Your physician recommends that you continue on your current medications as directed. Please refer to the Current Medication list given to you today.  *If you need a refill on your cardiac medications before your next appointment, please call your pharmacy*   Lab Work: Your physician recommends that you have labs drawn today: Lipid/liver panel  If you have labs (blood work) drawn today and your tests are completely normal, you will receive your results only by: MyChart Message (if you have MyChart) OR A paper copy in the mail If you have any lab test that is abnormal or we need to change your treatment, we will call you to review the results.    Follow-Up: At [facility], you and your health needs are our priority.  As part of our continuing mission to provide you with exceptional heart care, we have created designated Provider Care Teams.  These Care Teams include your primary Cardiologist (physician) and Advanced Practice Providers (APPs -  Physician Assistants and Nurse Practitioners) who all work together to provide you with the care you need, when you need it.  We recommend signing up for the patient portal called "MyChart".  Sign up information is provided on this After Visit Summary.  MyChart is used to connect with patients for Virtual Visits (Telemedicine).  Patients are able to view lab/test results, encounter notes, upcoming appointments, etc.  Non-urgent messages can be sent to your provider as well.   To learn more about what you can do with MyChart, go to [email or web address].    Your next appointment:   12 month(s)  Provider:   Nanetta Batty, MD    Other Instructions   1st Floor: - Lobby - Registration  - Pharmacy  - Lab - Cafe  2nd Floor: - PV Lab - Diagnostic Testing (echo, CT, nuclear med)  3rd Floor: - Vacant  4th Floor: - TCTS (cardiothoracic surgery) - AFib Clinic - Structural Heart Clinic - Vascular Surgery   - Vascular Ultrasound  5th Floor: - HeartCare Cardiology (general and EP) - Clinical Pharmacy for coumadin, hypertension, lipid, weight-loss medications, and med management appointments    Valet parking services will be available as well.

## 2023-06-26 NOTE — Assessment & Plan Note (Addendum)
 History of hyperlipidemia in the past although his nephrologist, Dr. Marisue Humble, apparently stopped his rosuvastatin a year ago.  I am going to repeat a lipid liver profile today.

## 2023-06-26 NOTE — Assessment & Plan Note (Signed)
 History of carotid artery disease with Dopplers performed 12/28/2022 revealing moderate left ICA stenosis.  This will be repeated in 1 year.

## 2023-06-26 NOTE — Assessment & Plan Note (Signed)
 History of essential hypertension blood pressure measured today at 100/70.  He is not on antihypertensive medications.

## 2023-06-26 NOTE — Assessment & Plan Note (Signed)
 History of PAD status post lower extremity angioplasty using CO2 with minimal contrast for claudication 12/11/2013.  His most recent Doppler studies reveal noncompressible ABI in the right and an ABI of 0.087 on the left with no significant obstructive disease except for an occluded right AT.  His angioplasty of right popliteal artery 06/01/2023 and of left SFA 12/11/2013.

## 2023-06-26 NOTE — Assessment & Plan Note (Signed)
 History of chronic diastolic heart failure with echo performed 11/09/2021 revealing hyperdynamic LV with an EF of 75% with grade 1 diastolic dysfunction.  His volume status is managed by dialysis.

## 2023-06-26 NOTE — Progress Notes (Signed)
 06/26/2023 Nat Math   [birth year]  [id number]  Primary Physician Ralene Ok, MD Primary Cardiologist: Runell Gess MD Nicholes Calamity, [place]  HPI:  Jon Anderson is a 73 y.o.   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 He was referred by Dr. Theo Dills for peripheral vascular evaluation prior to elective foot surgery. His primary care physician is Dr. Ludwig Clarks .I last saw him in the office 10/03/2020.  His cardiac risk factors include hypertension, diabetes and hyperlipidemia. He does not smoke. His sister died of a myocardial infarction at age 74. He has never had a heart attack or stroke and has had cardiac catheterization remotely that showed noncritical CAD. He does get fairly frequent chest pain and shortness of breath however. He has chronic renal sufficiency with creatinines in the mid 3 range thought to chronic kidney. Lower extremity arterial Doppler studies performed in the office 09/01/13 revealed a right ABI 1.1 the left of 0.76 with a high-frequency signal in the distal left SFA. He does complain of left calf claudication which is lifestyle limiting and affecting his ability to perform work.. There is no history of nonhealing wounds. Recent carotid Dopplers showed moderately severe left internal carotid artery stenosis.   Since I saw him last he has gone on dialysis February 2016. He also underwent off-pump LIMA insertion at [facility] in October of last year because of a high grade lesion that was discovered during a workup for renal transplant. He will cardiac catheterization by Dr. Herbie Baltimore 04/27/17 revealed a patent LIMA to the LAD with otherwise noncritical disease and normal LV function. He currently denies chest pain. He is scheduled for laminectomy on 07/03/17. Carotid Dopplers performed at [facility] imaging showed moderately severe left ICA stenosis which when compared  to the Dopplers performed in our office 10/15/13, did not appear significantly different. However, given the severity of the lesion I n the 70-90% range CTA of his neck revealing no significant obstructive disease in his carotid circulation.   He had a right TMA by Dr. Lajoyce Corners and right popliteal and posterior tibial intervention by Dr. Randie Heinz 08/21/2019.  He is on hemodialysis 3 times a week.  I last saw him in the hospital 11/15/2021 when he had a cardiac arrest after a radiologic procedure.  Apparently he had contrast allergy was and was premedicated.  He had ROSC fairly quickly and was discharged home the following day.  Since that time he has been doing well.  He denies chest pain.  He is chronically short of breath.  He denies claudication.   Current Meds  Medication Sig   aspirin EC 81 MG tablet Take 81 mg by mouth daily. Swallow whole.   calcium acetate (PHOSLO) 667 MG capsule Take 2 capsules (1,334 mg total) by mouth 2 (two) times daily with a meal.   clopidogrel (PLAVIX) 75 MG tablet Take 75 mg by mouth in the morning.   gabapentin (NEURONTIN) 100 MG capsule Take 100-200 mg by mouth See admin instructions. 100 mg in the morning, 200 mg in the evening   multivitamin (RENA-VIT) TABS tablet Take 1 tablet by mouth at bedtime.   oxyCODONE-acetaminophen (PERCOCET) 5-325 MG tablet Take 1 tablet by mouth every 6 (six) hours as needed.   pantoprazole (PROTONIX) 40 MG tablet Take 40 mg by mouth daily before breakfast.     Allergies  Allergen Reactions   Contrast Media [Iodinated Contrast  Media] Anaphylaxis    Cardiac arrest, Throat swelling, hives, SOB   Kiwi Extract Itching, Swelling and Other (See Comments)    Lips and face swell- breathing not affected   Flexeril [Cyclobenzaprine]     Hands become flimsy, can not hold things   Tape Other (See Comments)    "Plastic" tape causes blisters!!    Social History   Socioeconomic History   Marital status: Married    Spouse name: Not on file    Number of children: Not on file   Years of education: Not on file   Highest education level: Not on file  Occupational History   Occupation: retired  Tobacco Use   Smoking status: Former    Current packs/day: 0.00    Average packs/day: 1 pack/day for 2.0 years (2.0 ttl pk-yrs)    Types: Cigarettes    Start date: 39    Quit date: 1978    Years since quitting: 47.2    Passive exposure: Never   Smokeless tobacco: Never  Vaping Use   Vaping status: Never Used  Substance and Sexual Activity   Alcohol use: Not Currently    Alcohol/week: 0.0 standard drinks of alcohol    Comment: h/o social drinking   Drug use: Not Currently    Types: Marijuana    Comment: quit 1986   Sexual activity: Yes    Birth control/protection: None  Other Topics Concern   Not on file  Social History Narrative   The patient is a Copy.  He is married and has 2 grown children.  Lives in [place] with his wife.  Denies tobacco, alcohol or IV drug abuse or marijuana or cocaine intake.    Social Drivers of Corporate investment banker Strain: Not on file  Food Insecurity: Not on file  Transportation Needs: Not on file  Physical Activity: Not on file  Stress: Not on file  Social Connections: Not on file  Intimate Partner Violence: Not on file     Review of Systems: General: negative for chills, fever, night sweats or weight changes.  Cardiovascular: negative for chest pain, dyspnea on exertion, edema, orthopnea, palpitations, paroxysmal nocturnal dyspnea or shortness of breath Dermatological: negative for rash Respiratory: negative for cough or wheezing Urologic: negative for hematuria Abdominal: negative for nausea, vomiting, diarrhea, bright red blood per rectum, melena, or hematemesis Neurologic: negative for visual changes, syncope, or dizziness All other systems reviewed and are otherwise negative except as noted above.    Blood pressure 100/70, pulse 87, height 5\' 9"  (1.753 m), weight 171 lb (77.6  kg).  General appearance: alert and no distress Neck: no adenopathy, no JVD, supple, symmetrical, trachea midline, thyroid not enlarged, symmetric, no tenderness/mass/nodules, and soft bilateral carotid bruits Lungs: clear to auscultation bilaterally Heart: regular rate and rhythm, S1, S2 normal, no murmur, click, rub or gallop Extremities: extremities normal, atraumatic, no cyanosis or edema Pulses: Diminished pedal pulses Skin: Skin color, texture, turgor normal. No rashes or lesions Neurologic: Grossly normal  EKG EKG Interpretation Date/Time:  Tuesday June 26 2023 10:33:27 EDT Ventricular Rate:  87 PR Interval:  146 QRS Duration:  96 QT Interval:  362 QTC Calculation: 435 R Axis:   5  Text Interpretation: Normal sinus rhythm Anteroseptal infarct (cited on or before 26-Jun-2023) When compared with ECG of 28-Mar-2023 10:59, No significant change was found Confirmed by Nanetta Batty [phone]) on 06/26/2023 10:56:53 AM    ASSESSMENT AND PLAN:   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/[facility] History of CAD status  post off-pump LIMA to the LAD October 2018.  Cardiac cath performed Dr. Herbie Baltimore 04/27/2017 during a workup for renal transplant showed a patent LIMA to the LAD but otherwise no significant CAD.  He denies chest pain but does have chronic shortness of breath.  Chronic diastolic CHF (congestive heart failure) (HCC) History of chronic diastolic heart failure with echo performed 11/09/2021 revealing hyperdynamic LV with an EF of 75% with grade 1 diastolic dysfunction.  His volume status is managed by dialysis.  Hyperlipidemia LDL goal <70 History of hyperlipidemia in the past although his nephrologist, Dr. Marisue Humble, apparently stopped his rosuvastatin a year ago.  I am going to repeat a lipid liver profile today.  Essential hypertension History of essential hypertension blood pressure measured today at 100/70.  He is not on antihypertensive medications.  PVD (peripheral vascular disease)  (HCC) History of PAD status post lower extremity angioplasty using CO2 with minimal contrast for claudication 12/11/2013.  His most recent Doppler studies reveal noncompressible ABI in the right and an ABI of 0.087 on the left with no significant obstructive disease except for an occluded right AT.  His angioplasty of right popliteal artery 06/01/2023 and of left SFA 12/11/2013.  Carotid artery disease (HCC) History of carotid artery disease with Dopplers performed 12/28/2022 revealing moderate left ICA stenosis.  This will be repeated in 1 year.     Runell Gess MD FACP,FACC,FAHA, [facility] 06/26/2023 11:11 AM

## 2023-06-29 ENCOUNTER — Encounter: Payer: Self-pay | Admitting: *Deleted

## 2023-07-12 ENCOUNTER — Telehealth: Payer: Self-pay

## 2023-07-12 NOTE — Telephone Encounter (Signed)
 Triage/post-op: -pt called stating he has a swollen area going towards his arm pit and was not sure if there is a problem.  -returned call to pt who describes the are to be almost the size of a golf ball on the underside of his arm that is not firm or painful, there is no redness or discoloration.  He does report tingling in his fingers that has not changed since surgery.   -on chart review, noted no post-op appt scheduled.  Will book combined post-op/triage appt for Friday.  No studies required at this time per PA

## 2023-07-13 ENCOUNTER — Ambulatory Visit (INDEPENDENT_AMBULATORY_CARE_PROVIDER_SITE_OTHER)

## 2023-07-13 VITALS — BP 96/59 | HR 76 | Temp 98.0°F | Ht 69.0 in | Wt 169.2 lb

## 2023-07-13 DIAGNOSIS — N186 End stage renal disease: Secondary | ICD-10-CM

## 2023-07-13 NOTE — Progress Notes (Signed)
    Postoperative Access Visit   History of Present Illness   Jon Anderson is a 73 y.o. year old male who presents for postoperative follow-up for: right arm brachial axillary AV Graft creation by Dr. Hetty Blend on 06/19/23.  The patient's wounds are well healed.  The patient notes mild steal symptoms.  The patient is able to complete their activities of daily living.  The patient's current symptoms are: mild numbness in right forearm and 2nd-4th fingers. He does not have any coldness, weakness or pain. He also noticed a " bulge" up near arm pit close to incision that has been concerning him.   He has history of multiple bilateral upper extremity accesses that have failed  He currently dialyzes via right internal jugular TDC on MWF at the [facility] Location   Physical Examination   Vitals:   07/13/23 1403  BP: (!) 96/59  Pulse: 76  Temp: 98 F (36.7 C)  TempSrc: Temporal  SpO2: 96%  Weight: 169 lb 3.2 oz (76.7 kg)  Height: 5\' 9"  (1.753 m)   Body mass index is 24.99 kg/m.  right arm Incision is well healed, 1+ radial pulse, hand grip is 5/5, sensation in digits is intact, palpable thrill, bruit can be auscultated     Medical Decision Making   Jon Anderson is a 73 y.o. year old male who presents s/p  right arm brachial axillary AV Graft creation by Dr. Hetty Blend on 06/19/23.  The patient's wounds are well healed.  The patient notes mild steal symptoms. Recommend he elevate his arm to help with swelling. Discussed that he likely has some nerve irritation that may or may not resolve with time in the right forearm and hand. No signs of ischemia. Suspect he has a small hematoma or seroma in the upper arm. Recommend time and warm compresses.  The patient's access will be ready for use after 07/20/23 The patient's tunneled dialysis catheter can be removed when Nephrology is comfortable with the performance of the right upper arm graft The patient may follow up on a prn  basis   Graceann Congress, PA-C [facility] Office: [phone]  Clinic MD: Hetty Blend

## 2023-07-17 ENCOUNTER — Encounter: Payer: Self-pay | Admitting: Podiatry

## 2023-07-17 ENCOUNTER — Ambulatory Visit (INDEPENDENT_AMBULATORY_CARE_PROVIDER_SITE_OTHER): Payer: Medicare Other | Admitting: Podiatry

## 2023-07-17 VITALS — Ht 69.0 in | Wt 169.2 lb

## 2023-07-17 DIAGNOSIS — M2042 Other hammer toe(s) (acquired), left foot: Secondary | ICD-10-CM

## 2023-07-17 DIAGNOSIS — B351 Tinea unguium: Secondary | ICD-10-CM | POA: Diagnosis not present

## 2023-07-17 DIAGNOSIS — L84 Corns and callosities: Secondary | ICD-10-CM | POA: Diagnosis not present

## 2023-07-17 DIAGNOSIS — Z89431 Acquired absence of right foot: Secondary | ICD-10-CM

## 2023-07-17 DIAGNOSIS — E1151 Type 2 diabetes mellitus with diabetic peripheral angiopathy without gangrene: Secondary | ICD-10-CM

## 2023-07-17 NOTE — Progress Notes (Unsigned)
 Subjective:  Patient ID: Jon Anderson, male    DOB: Nov 06, 1950,  MRN: [id number]  Jon Anderson presents to clinic today for:  Chief Complaint  Patient presents with   Nail Problem    Pt is here for [facility].   Patient notes nails are thick and elongated, causing pain in shoe gear when ambulating.  He had a previous right foot TMA, and notes a broad callus across his TMA site.  He also has a callus left submet 5.  He is wondering about the status of his diabetic shoes from last year.  He notes continued stinging in his feet. States his gabapentin dosage/frequency has not been changed in a long time.    PCP is Ralene Ok, MD.  Last seen around 05/03/23.  Past Medical History:  Diagnosis Date   Anemia of chronic disease    Arthritis    CAD (coronary artery disease)    Carotid stenosis    a. <50% RICA, >70% LICA by duplex 07/2015   Chronic chest pain    occ   Constipation    chronic   Dyspnea    occasional with extertion   ESRD (end stage renal disease) on dialysis (HCC)    M-W-F- [name]   GERD (gastroesophageal reflux disease)    History of hiatal hernia    History of kidney stones    Passed   HNP (herniated nucleus pulposus), lumbar    HTN (hypertension)    echo 3/10: EF 60%, LAE   Hyperlipidemia    Mitral stenosis    mild MS 11/15/21   Nephrolithiasis    "passed them all"   PEA (Pulseless electrical activity) (HCC) 11/15/2021   PEA arrest felt likely related to contrast anaphylaxis despite premedication   Peripheral arterial disease (HCC)    a. s/p PTCA  right    Pneumonia yrs ago   Restless legs    Sciatic leg pain    Sleep apnea    does not use cpap   Snores    a. presumed OSA, pt has refused sleep eval in past.   Type II diabetes mellitus (HCC)    no longer on medications, checks blood glucose at home   Urinary frequency    Uses wheelchair    Walker as ambulation aid     Allergies  Allergen Reactions   Contrast Media [Iodinated Contrast  Media] Anaphylaxis    Cardiac arrest, Throat swelling, hives, SOB   Kiwi Extract Itching, Swelling and Other (See Comments)    Lips and face swell- breathing not affected   Flexeril [Cyclobenzaprine]     Hands become flimsy, can not hold things   Tape Other (See Comments)    "Plastic" tape causes blisters!!    Objective:  Jon Anderson is a pleasant 73 y.o. male in NAD. AAO x 3.  Vascular Examination: Patient has palpable DP pulse, absent PT pulse bilateral.  Delayed capillary refill left foot.  Sparse digital hair left foot.  Proximal to distal cooling WNL .    Dermatological Examination: Interspaces are clear with no open lesions noted bilateral.  Skin is shiny and atrophic bilateral.  Nails are 3-25mm thick, with yellowish/brown discoloration, subungual debris and distal onycholysis x10.  There is pain with compression of nails x10.  There are hyperkeratotic lesions noted left submet 5 and across the right TMA site.  Neurological Examination: Vibratory sensation diminished b/l.  Patient qualifies for at-risk foot care because of previous partial  amputation of right foot.  .  Assessment/Plan: 1. S/P transmetatarsal amputation of foot, right (HCC)   2. Type II diabetes mellitus with peripheral circulatory disorder (HCC)   3. Hammertoe of left foot   4. Callus of foot   5. Dermatophytosis of nail     FOR HOME USE ONLY DME DIABETIC SHOE  Mycotic nails x5 were sharply debrided with sterile nail nippers and power debriding burr to decrease bulk and length.  Hyperkeratotic lesions were shaved with #312 blade.  The right TMA callus was also sanded with an umbrella burr.  Patient needs to have new order placed for diabetic shoes with custom insoles.  He'll be scheduled with our pedorthist for new consultation.     Return in about 3 months (around 10/16/2023) for [facility].   Clerance Lav, DPM, FACFAS [facility]     [address] [place], [place] [address]                Office [phone]  Fax [phone]

## 2023-07-24 ENCOUNTER — Ambulatory Visit

## 2023-07-24 NOTE — Progress Notes (Signed)
 Patient presents to the office today for diabetic shoe and insole measuring.  Patient was measured with brannock device to determine size and width for 1 pair of extra depth shoes, RT Toefiller and 3 custom DM inserts for left foot  Emailed [facility] to see if scans are still on file from 2024 CXLD order  Documentation of medical necessity will be sent to patient's treating diabetic doctor to verify and sign.   Patient's diabetic provider: None yet starting [facility] soon will call with Provider info once seen   Shoes and insoles will be ordered at that time and patient will be notified for an appointment for fitting when they arrive.   Shoe size (per patient): 8.5 Brannock measurement: 8.5 Shoe choice:   1260M / B3000M Shoe size ordered: 8.5WD ABN and ppw signed

## 2023-09-06 ENCOUNTER — Telehealth: Payer: Self-pay

## 2023-09-06 NOTE — Telephone Encounter (Signed)
 Patient called with new Dr info I could not understand dr. Name I called patient he was not at home but will call back with name of new Treating Dr for Diabetes  Jon Anderson

## 2023-10-16 ENCOUNTER — Ambulatory Visit (INDEPENDENT_AMBULATORY_CARE_PROVIDER_SITE_OTHER): Admitting: Podiatry

## 2023-10-16 DIAGNOSIS — B351 Tinea unguium: Secondary | ICD-10-CM

## 2023-10-16 DIAGNOSIS — L84 Corns and callosities: Secondary | ICD-10-CM

## 2023-10-16 DIAGNOSIS — Z89431 Acquired absence of right foot: Secondary | ICD-10-CM

## 2023-10-16 NOTE — Progress Notes (Unsigned)
 Subjective:  Patient ID: Jon Anderson, male    DOB: [birth year],  MRN: [id number]  Jon Anderson presents to clinic today for:  Chief Complaint  Patient presents with   [facility]    RM#12 [facility] patient has no other concerns today other han his calluses starting to get bothersome.    Patient notes nails are thick and elongated, causing pain in shoe gear when ambulating.  He had a previous right foot TMA, and notes a broad callus across his TMA site.  He also has a callus left submet 5.    PCP is Jon Carwin, MD.  Last seen around 07/31/23.  Past Medical History:  Diagnosis Date   Anemia of chronic disease    Arthritis    CAD (coronary artery disease)    Carotid stenosis    a. <50% RICA, >70% LICA by duplex 07/2015   Chronic chest pain    occ   Constipation    chronic   Dyspnea    occasional with extertion   ESRD (end stage renal disease) on dialysis (HCC)    [facility]-W-F- [facility]   GERD (gastroesophageal reflux disease)    History of hiatal hernia    History of kidney stones    Passed   HNP (herniated nucleus pulposus), lumbar    HTN (hypertension)    echo 3/10: EF 60%, LAE   Hyperlipidemia    Mitral stenosis    mild MS 11/15/21   Nephrolithiasis    passed them all   PEA (Pulseless electrical activity) (HCC) 11/15/2021   PEA arrest felt likely related to contrast anaphylaxis despite premedication   Peripheral arterial disease (HCC)    a. s/p PTCA  right    Pneumonia yrs ago   Restless legs    Sciatic leg pain    Sleep apnea    does not use cpap   Snores    a. presumed OSA, pt has refused sleep eval in past.   Type II diabetes mellitus (HCC)    no longer on medications, checks blood glucose at home   Urinary frequency    Uses wheelchair    Walker as ambulation aid     Allergies  Allergen Reactions   Contrast Media [Iodinated Contrast Media] Anaphylaxis    Cardiac arrest, Throat swelling, hives, SOB   Kiwi Extract Itching, Swelling and Other (See Comments)     Lips and face swell- breathing not affected   Flexeril  [Cyclobenzaprine ]     Hands become flimsy, can not hold things   Tape Other (See Comments)    Plastic tape causes blisters!!    Objective:  Jon Anderson is a pleasant 73 y.o. male in NAD. AAO x 3.  Vascular Examination: Patient has palpable DP pulse, absent PT pulse bilateral.  Delayed capillary refill left foot.  Sparse digital hair left foot.  Proximal to distal cooling WNL .    Dermatological Examination: Interspaces are clear with no open lesions noted bilateral.  Skin is shiny and atrophic bilateral.  Nails are 3-63mm thick, with yellowish/brown discoloration, subungual debris and distal onycholysis x10.  There is pain with compression of nails x10.  There are hyperkeratotic lesions noted left submet 5 and a long horizontal callus across the right TMA site.  Neurological Examination: Vibratory sensation diminished b/l.  Patient qualifies for at-risk foot care because of previous partial amputation of right foot.  .  Assessment/Plan: 1. Dermatophytosis of nail   2. Callus of foot   3. S/P  transmetatarsal amputation of foot, right (HCC)    Mycotic nails x5 were sharply debrided with sterile nail nippers and power debriding burr to decrease bulk and length.  Hyperkeratotic lesions x2 were shaved with #312 blade.    Return in about 3 months (around 01/16/2024) for [facility].   Jon Anderson, DPM, FACFAS [facility]     [address] [place], [place] [address]                Office [phone]  Fax [phone]

## 2023-11-05 ENCOUNTER — Encounter (HOSPITAL_COMMUNITY): Payer: Self-pay | Admitting: Emergency Medicine

## 2023-11-05 ENCOUNTER — Other Ambulatory Visit: Payer: Self-pay

## 2023-11-05 ENCOUNTER — Ambulatory Visit (HOSPITAL_COMMUNITY)
Admission: EM | Admit: 2023-11-05 | Discharge: 2023-11-05 | Disposition: A | Discharge status: Home / Self Care | Attending: Internal Medicine | Admitting: Internal Medicine

## 2023-11-05 ENCOUNTER — Emergency Department (HOSPITAL_COMMUNITY)

## 2023-11-05 ENCOUNTER — Emergency Department (HOSPITAL_COMMUNITY)
Admission: EM | Admit: 2023-11-05 | Discharge: 2023-11-06 | Disposition: A | Discharge status: Home / Self Care | Attending: Emergency Medicine | Admitting: Emergency Medicine

## 2023-11-05 ENCOUNTER — Encounter (HOSPITAL_COMMUNITY): Payer: Self-pay

## 2023-11-05 ENCOUNTER — Ambulatory Visit (INDEPENDENT_AMBULATORY_CARE_PROVIDER_SITE_OTHER)

## 2023-11-05 DIAGNOSIS — R2233 Localized swelling, mass and lump, upper limb, bilateral: Secondary | ICD-10-CM | POA: Diagnosis present

## 2023-11-05 DIAGNOSIS — M7989 Other specified soft tissue disorders: Secondary | ICD-10-CM | POA: Diagnosis not present

## 2023-11-05 DIAGNOSIS — N186 End stage renal disease: Secondary | ICD-10-CM

## 2023-11-05 DIAGNOSIS — I517 Cardiomegaly: Secondary | ICD-10-CM | POA: Insufficient documentation

## 2023-11-05 DIAGNOSIS — I700 Atherosclerosis of aorta: Secondary | ICD-10-CM | POA: Diagnosis not present

## 2023-11-05 DIAGNOSIS — I2510 Atherosclerotic heart disease of native coronary artery without angina pectoris: Secondary | ICD-10-CM | POA: Insufficient documentation

## 2023-11-05 DIAGNOSIS — R220 Localized swelling, mass and lump, head: Secondary | ICD-10-CM

## 2023-11-05 DIAGNOSIS — Z992 Dependence on renal dialysis: Secondary | ICD-10-CM

## 2023-11-05 DIAGNOSIS — N62 Hypertrophy of breast: Secondary | ICD-10-CM | POA: Diagnosis not present

## 2023-11-05 DIAGNOSIS — Z7982 Long term (current) use of aspirin: Secondary | ICD-10-CM | POA: Diagnosis not present

## 2023-11-05 DIAGNOSIS — R0602 Shortness of breath: Secondary | ICD-10-CM

## 2023-11-05 MED ORDER — CALCIUM ACETATE (PHOS BINDER) 667 MG PO CAPS
1334.0000 mg | ORAL_CAPSULE | Freq: Two times a day (BID) | ORAL | Status: DC
  Administered 2023-11-06: 1334 mg via ORAL
  Filled 2023-11-05: qty 2

## 2023-11-05 MED ORDER — GABAPENTIN 100 MG PO CAPS: 100.0000 mg | ORAL_CAPSULE | Freq: Every day | ORAL | Status: DC

## 2023-11-05 MED ORDER — RENA-VITE PO TABS
1.0000 | ORAL_TABLET | Freq: Every day | ORAL | Status: DC
  Filled 2023-11-05: qty 1

## 2023-11-05 MED ORDER — HYDROCODONE-ACETAMINOPHEN 5-325 MG PO TABS
1.0000 | ORAL_TABLET | Freq: Three times a day (TID) | ORAL | Status: DC | PRN
  Administered 2023-11-06: 1 via ORAL
  Filled 2023-11-05: qty 1

## 2023-11-05 MED ORDER — ASPIRIN 81 MG PO TBEC
81.0000 mg | DELAYED_RELEASE_TABLET | Freq: Every day | ORAL | Status: DC
  Administered 2023-11-06: 81 mg via ORAL
  Filled 2023-11-05: qty 1

## 2023-11-05 MED ORDER — CLOPIDOGREL BISULFATE 75 MG PO TABS
75.0000 mg | ORAL_TABLET | Freq: Every morning | ORAL | Status: DC
  Administered 2023-11-06: 75 mg via ORAL
  Filled 2023-11-05: qty 1

## 2023-11-05 MED ORDER — PANTOPRAZOLE SODIUM 40 MG PO TBEC
40.0000 mg | DELAYED_RELEASE_TABLET | Freq: Every day | ORAL | Status: DC
  Administered 2023-11-06: 40 mg via ORAL
  Filled 2023-11-05: qty 1

## 2023-11-05 MED ORDER — GABAPENTIN 100 MG PO CAPS: 100.0000 mg | ORAL_CAPSULE | ORAL | Status: DC

## 2023-11-05 NOTE — Discharge Instructions (Addendum)
 Due to the increased shortness of breath, facial swelling and upper extremity swelling along with the chest x-ray findings, we recommend further evaluation at the emergency room where lab work and further imaging that is more advanced can be performed.

## 2023-11-05 NOTE — ED Triage Notes (Addendum)
 Pt present to UC with c/o bilateral hand swelling, facial swelling and shortness of breath. States the right hand has been swollen since February after the placement of a fistula in the upper right arm. Reports the left hand began swelling yesterday. Denies obvious injuries or trauma to the left hand. Pt also reports the he noticed the facial swelling this morning. Denies any known allergic reaction. Shortness of breath has been ongoing for about 3.4 years but states today is worse. Reports does intermittently wear O2 but only during Dialysis. Denies hx of asthma or COPD. Also adds he had a hard BM this morning and feels like he may have hemorrhoids.

## 2023-11-05 NOTE — ED Notes (Signed)
 Tried to obtain blood twice was unable to collect sample

## 2023-11-05 NOTE — ED Notes (Signed)
 Pt given a sandwich bag and something to drink per provider ok. Denies pain right now. Educated to call me if he changes his mind. Verbalizes understanding. Call bell within reach.

## 2023-11-05 NOTE — Progress Notes (Signed)
 Dozier RN made aware unable to start IV with US  and no other sites found to attempt.  MD made aware.

## 2023-11-05 NOTE — ED Triage Notes (Signed)
 Pt arrives with c/o SOB that has worsened today. Pt reports swelling of his bilateral hands and face. Pt is a HD and goes on MWF. Pt missed his treatment today. Pt denies CP.

## 2023-11-05 NOTE — ED Provider Notes (Signed)
 [facility]    CSN: [id number] Arrival date & time: 11/05/23  1418      History   Chief Complaint Chief Complaint  Patient presents with   Facial Swelling   Shortness of Breath   hand swelling    HPI Jon Anderson is a 73 y.o. male.   72 year old male who presents urgent care with complaints of left hand swelling, right hand swelling, facial swelling and shortness of breath.  He reports that his right hand has been swelling since he had his dialysis access placed in February.  He has seen vascular regarding this and they have recommended elevation of his hand.  He is using this for access now.  He was using a PermCath previously.  He reports that the swelling in his left hand just started yesterday and the facial swelling started this morning.  He has had shortness of breath for several years but reports that the shortness of breath was worse this morning.  He also related waking up with some back pain and due to this he did not go to dialysis today.  He relates that he has had numerous dialysis accesses performed in both upper extremities as well as numerous catheters.  He has never been diagnosed with superior vena cava syndrome.  He denies any knowledge of any upper extremity blood clots in the past.  He has not seen vascular since his appointment in April.   Shortness of Breath Associated symptoms: no abdominal pain, no chest pain, no cough, no ear pain, no fever, no rash, no sore throat and no vomiting     Past Medical History:  Diagnosis Date   Anemia of chronic disease    Arthritis    CAD (coronary artery disease)    Carotid stenosis    a. <50% RICA, >70% LICA by duplex 07/2015   Chronic chest pain    occ   Constipation    chronic   Dyspnea    occasional with extertion   ESRD (end stage renal disease) on dialysis (HCC)    M-W-F- [facility]   GERD (gastroesophageal reflux disease)    History of hiatal hernia    History of kidney stones    Passed   HNP  (herniated nucleus pulposus), lumbar    HTN (hypertension)    echo 3/10: EF 60%, LAE   Hyperlipidemia    Mitral stenosis    mild MS 11/15/21   Nephrolithiasis    passed them all   PEA (Pulseless electrical activity) (HCC) 11/15/2021   PEA arrest felt likely related to contrast anaphylaxis despite premedication   Peripheral arterial disease (HCC)    a. s/p PTCA  right    Pneumonia yrs ago   Restless legs    Sciatic leg pain    Sleep apnea    does not use cpap   Snores    a. presumed OSA, pt has refused sleep eval in past.   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) 12/10/2019   Type 2 diabetes mellitus with diabetic chronic kidney  disease (HCC) 12/03/2019   Right below-knee amputee (HCC) 11/25/2019   S/P transmetatarsal amputation of foot, right (HCC) 11/25/2019   Gangrene of right foot (HCC) 11/19/2019   History of partial ray amputation of fifth toe of right foot (HCC) 09/09/2019   Ischemic ulcer diabetic foot (HCC) 09/09/2019   Subacute osteomyelitis, right ankle and foot (HCC)    Diabetic foot infection (HCC) 07/24/2019   Chronic diastolic CHF (congestive heart failure) (HCC) 07/24/2019   Diabetic ulcer of right midfoot associated with diabetes mellitus due to underlying condition, with fat layer exposed (HCC)    Gastroenteritis 04/24/2019   Presence of aortocoronary bypass graft 04/11/2019   Long term (current) use of aspirin  04/11/2019   Dependence on renal dialysis (HCC) 04/11/2019   Other constipation 04/11/2019   Allergy, unspecified, initial encounter 01/27/2019   DM  neuropathy with neurologic complication (HCC) 02/28/2018   GERD (gastroesophageal reflux disease) 02/28/2018   Nephrolithiasis 02/28/2018   Sciatic leg pain 02/28/2018   Snores 02/28/2018   Orthopnea 02/23/2018   Elevated troponin 02/23/2018   HNP (herniated nucleus pulposus), lumbar 07/03/2017   Encounter for removal of sutures 06/01/2017   Chest pain in adult 04/27/2017   Restless leg syndrome, uncontrolled 04/27/2017   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/[facility] 03/17/2017   Anemia of chronic disease 03/17/2017   ESRD (end stage renal disease) on dialysis (HCC) 03/17/2017   Chronic chest pain 03/17/2017   Type II diabetes mellitus (HCC) 03/17/2017   Acute on chronic diastolic heart failure (HCC) 03/17/2017   Acute heart failure (HCC) 03/17/2017   Lumbar radiculopathy 01/16/2017   Pre-transplant evaluation for kidney transplant 06/15/2016   Pain, unspecified 04/17/2016   Nocturia 12/17/2015   Chest pain, non-cardiac 10/09/2015   Hypercalcemia 05/10/2015   Other fluid overload 02/24/2015   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) 10/03/2014   Fatty (change of) liver, not elsewhere classified 07/02/2014   Aftercare including intermittent dialysis (HCC) 06/24/2014   Diarrhea, unspecified 06/24/2014   Fever, unspecified 06/24/2014   Iron deficiency anemia, unspecified 06/24/2014   Other specified coagulation defects (HCC) 06/24/2014   Pruritus, unspecified 06/24/2014   Secondary hyperparathyroidism of renal origin (HCC) 06/24/2014   Type 2 diabetes mellitus with diabetic peripheral angiopathy without gangrene (HCC) 06/24/2014   Acute on chronic renal failure (HCC) 06/16/2014   Shoulder pain, left 12/15/2013   Chest pain 12/15/2013   Claudication (HCC) 12/11/2013   PVD (peripheral vascular disease) (HCC) 12/11/2013   Carotid artery disease (HCC) 09/30/2013   Acute chest pain 11/15/2012   Bruit 09/15/2010   CAD (coronary artery disease) nonobstructive per cath  2012    Hyperlipidemia LDL goal <70 07/22/2009   Essential hypertension 07/22/2009    Past Surgical History:  Procedure Laterality Date   A/V FISTULAGRAM Left 01/10/2021   Procedure: A/V FISTULAGRAM;  Surgeon: Sheree Penne Bruckner, MD;  Location: [facility] INVASIVE CV LAB;  Service: Cardiovascular;  Laterality: Left;   A/V FISTULAGRAM Left 05/19/2021   Procedure: A/V Fistulagram;  Surgeon: Gretta Bruckner PARAS, MD;  Location: [facility] INVASIVE CV LAB;  Service: Cardiovascular;  Laterality: Left;   ABDOMINAL AORTOGRAM W/LOWER EXTREMITY Bilateral 08/21/2019   Procedure: ABDOMINAL AORTOGRAM W/LOWER EXTREMITY;  Surgeon: Sheree Penne Bruckner, MD;  Location: [facility] INVASIVE CV LAB;  Service: Cardiovascular;  Laterality: Bilateral;   AMPUTATION Right 07/25/2019   Procedure: RIGHT 5th RAY AMPUTATION;  Surgeon: Harden Jerona GAILS, MD;  Location: [facility] OR;  Service: Orthopedics;  Laterality: Right;   AMPUTATION Right 11/19/2019   Procedure:  RIGHT TRANSMETATARSAL AMPUTATION;  Surgeon: Harden Jerona GAILS, MD;  Location: [facility] OR;  Service: Orthopedics;  Laterality: Right;   ANGIOPLASTY / STENTING FEMORAL Left 12/11/2013   dr berry   AV FISTULA PLACEMENT Left 03/19/2014   Procedure: CREATION OF ARTERIOVENOUS (AV) FISTULA  LEFT UPPER ARM;  Surgeon: Lynwood JONETTA Collum, MD;  Location: [facility] OR;  Service: Vascular;  Laterality: Left;   AV FISTULA PLACEMENT Left 11/09/2020   Procedure: LEFT ARM ARTERIOVENOUS (AV) FISTULA CREATION;  Surgeon: Sheree Penne Bruckner, MD;  Location: [facility] OR;  Service: Vascular;  Laterality: Left;   AV FISTULA PLACEMENT Left 02/15/2021   Procedure: INSERTION OF LEFT ARM ARTERIOVENOUS GORE-TEX GRAFT;  Surgeon: Sheree Penne Bruckner, MD;  Location: [facility] OR;  Service: Vascular;  Laterality: Left;   AV FISTULA PLACEMENT Right 02/16/2022   Procedure: RIGHT ARM BRACHIOCEPAHLIC ARTERIOVENOUS (AV) FISTULA CREATION;  Surgeon: Sheree Penne Bruckner, MD;  Location: [facility] OR;  Service: Vascular;  Laterality: Right;  Regional  and MAC   AV FISTULA PLACEMENT Right 06/19/2023   Procedure: INSERTION OF ARTERIOVENOUS (AV) GORE-TEX GRAFT RIGHT ARM;  Surgeon: Pearline Norman RAMAN, MD;  Location: [facility] OR;  Service: Vascular;  Laterality: Right;   BACK SURGERY  01/2018   screws placed    BASCILIC VEIN TRANSPOSITION Left 12/14/2020   Procedure: REVISION OF LEFT ARM SECOND STAGE BASILIC VEIN TRANSPOSITION;  Surgeon: Sheree Penne Bruckner, MD;  Location: [facility] OR;  Service: Vascular;  Laterality: Left;   BASCILIC VEIN TRANSPOSITION Right 04/18/2022   Procedure: RIGHT BRACHIOBASILIC FISTULA SECOND STAGE TRANSPOSITION;  Surgeon: Sheree Penne Bruckner, MD;  Location: [facility] OR;  Service: Vascular;  Laterality: Right;   CARDIAC CATHETERIZATION  2001 and 2010    COLONOSCOPY W/ BIOPSIES AND POLYPECTOMY     COLONOSCOPY WITH PROPOFOL  N/A 08/01/2016   Procedure: COLONOSCOPY WITH PROPOFOL ;  Surgeon: Belvie Just, MD;  Location: [facility];  Service: Endoscopy;  Laterality: N/A;   COLONOSCOPY WITH PROPOFOL  N/A 01/17/2022   Procedure: COLONOSCOPY WITH PROPOFOL ;  Surgeon: Just Belvie, MD;  Location: [facility];  Service: Gastroenterology;  Laterality: N/A;   CORONARY ARTERY BYPASS GRAFT  2019   [facility] x [facility] bypass   ESOPHAGOGASTRODUODENOSCOPY (EGD) WITH PROPOFOL  N/A 08/01/2016   Procedure: ESOPHAGOGASTRODUODENOSCOPY (EGD) WITH PROPOFOL ;  Surgeon: Belvie Just, MD;  Location: [facility];  Service: Endoscopy;  Laterality: N/A;   FISTULA SUPERFICIALIZATION Left 02/10/2020   Procedure: 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;  Surgeon: Oris Krystal FALCON, MD;  Location: [facility] OR;  Service: Vascular;  Laterality: Left;   FISTULA SUPERFICIALIZATION Left 03/23/2020   Procedure: LEFT UPPER EXTREMITY ARTERIOVENOUS FISTULA PLICATION;  Surgeon: Oris Krystal FALCON, MD;  Location: [facility] OR;  Service: Vascular;  Laterality: Left;   FOOT FRACTURE SURGERY Right    ligament repair   FRACTURE SURGERY     left forearm   GRAFT APPLICATION Right 05/13/2019    Procedure: FAT GRAFT APPLICATION;  Surgeon: Gretel Ozell PARAS, DPM;  Location: [facility];  Service: Podiatry;  Laterality: Right;   HEMATOMA EVACUATION Left 11/09/2020   Procedure: EVACUATION HEMATOMA LEFT ARM;  Surgeon: Eliza Bruckner RAMAN, MD;  Location: [facility] OR;  Service: Vascular;  Laterality: Left;   INGUINAL HERNIA REPAIR Left    IR AV DIALY SHUNT INTRO NEEDLE/INTRACATH INITIAL W/PTA/IMG LEFT  11/15/2021   IR AV DIALY SHUNT INTRO NEEDLE/INTRACATH INITIAL W/PTA/IMG RIGHT Right 08/15/2022   IR FLUORO GUIDE CV LINE LEFT  01/26/2023   IR FLUORO GUIDE CV LINE RIGHT  01/20/2022   IR FLUORO  GUIDE CV LINE RIGHT  05/03/2022   IR REMOVAL TUN CV CATH W/O FL  06/16/2021   IR REMOVAL TUN CV CATH W/O FL  07/06/2022   IR US  GUIDE VASC ACCESS LEFT  11/15/2021   IR US  GUIDE VASC ACCESS LEFT  11/15/2021   IR US  GUIDE VASC ACCESS LEFT  01/26/2023   IR US  GUIDE VASC ACCESS RIGHT  11/15/2021   IR US  GUIDE VASC ACCESS RIGHT  01/20/2022   IR US  GUIDE VASC ACCESS RIGHT  08/15/2022   LEFT HEART CATH AND CORS/GRAFTS ANGIOGRAPHY N/A 04/27/2017   Procedure: LEFT HEART CATH AND CORS/GRAFTS ANGIOGRAPHY;  Surgeon: Anner Alm ORN, MD;  [facility] INVAS[facility] CV LAB;  Service: Cardiovascular;  Laterality: N/A;   LEFT HEART CATHETERIZATION WITH CORONARY ANGIOGRAM N/A 06/22/2014   Procedure: LEFT HEART CATHETERIZATION WITH CORONARY ANGIOGRAM;  Surgeon: Debby DELENA Sor, MD;  Location: [facility] CATH LAB;  Service: Cardiovascular;  Laterality: N/A;   LOWER EXTREMITY ANGIOGRAM Left 12/11/2013   Procedure: LOWER EXTREMITY ANGIOGRAM;  Surgeon: Dorn JINNY Lesches, MD;  Location: [facility] CATH LAB;  Service: Cardiovascular;  Laterality: Left;   LUMBAR LAMINECTOMY/DECOMPRESSION MICRODISCECTOMY Right 07/03/2017   Procedure: MICRODISCECTOMY LUMBAR FIVE - SACRAL ONE RIGHT;  Surgeon: Lanis Pupa, MD;  [facility];  Service: Neurosurgery;  Laterality: Right;   LUMBAR LAMINECTOMY/DECOMPRESSION MICRODISCECTOMY Right 10/19/2017   Procedure:  MICRODISCECTOMY LUMBAR FIVE- SACRAL 1 ONE ;  Surgeon: Lanis Pupa, MD;  Location[facility];  Service: Neurosurgery;  Laterality: Right;   PERIPHERAL VASCULAR BALLOON ANGIOPLASTY Left 05/19/2021   Procedure: PERIPHERAL VASCULAR BALLOON ANGIOPLASTY;  Surgeon: Gretta Lonni JINNY, MD;  [facility] LAB;  Service: Cardiovascular;  Laterality: Left;   PERIPHERAL VASCULAR INTERVENTION Left 01/10/2021   Procedure: PERIPHERAL VASCULAR INTERVENTION;  Surgeon: Sheree Penne Lonni, MD;  Location: [facility] INVASIVE CV LAB;  Service: Cardiovascular;  Laterality: Left;   POLYPECTOMY  01/17/2022   Procedure: POLYPECTOMY;  Surgeon: Rollin Dover, MD;  Location: [facility];  Service: Gastroenterology;;   THROMBECTOMY W/ EMBOLECTOMY Left 11/09/2020   Procedure: THROMBECTOMY OF LEFT ARTERIOVENOUS FISTULA;  Surgeon: Eliza Lonni RAMAN, MD;  Location: [facility] OR;  Service: Vascular;  Laterality: Left;   TONSILLECTOMY AND ADENOIDECTOMY     WISDOM TOOTH EXTRACTION     WOUND DEBRIDEMENT Right 05/13/2019   Procedure: DEBRIDEMENT WOUND;  Surgeon: Gretel Ozell JINNY, DPM;  Location: [facility];  Service: Podiatry;  Laterality: Right;       Home Medications    Prior to Admission medications   Medication Sig Start Date End Date Taking? Authorizing Provider  aspirin  EC 81 MG tablet Take 81 mg by mouth daily. Swallow whole.    [provider]  calcium  acetate (PHOSLO ) 667 MG capsule Take 2 capsules (1,334 mg total) by mouth 2 (two) times daily with a meal. 12/01/19   Angiulli, Toribio JINNY, PA-C  clopidogrel  (PLAVIX ) 75 MG tablet Take 75 mg by mouth in the morning. 12/01/19   [provider]  gabapentin  (NEURONTIN ) 100 MG capsule Take 100-200 mg by mouth See admin instructions. 100 mg in the morning, 200 mg in the evening 11/28/19   [provider]  multivitamin (RENA-VIT) TABS tablet Take 1 tablet by mouth at bedtime. 12/01/19   Angiulli, Toribio JINNY, PA-C   oxyCODONE -acetaminophen  (PERCOCET) 5-325 MG tablet Take 1 tablet by mouth every 6 (six) hours as needed. Patient not taking: Reported on 07/17/2023 06/19/23   [place] Lucie JINNY, PA-C  pantoprazole  (PROTONIX ) 40 MG tablet Take 40 mg by  mouth daily before breakfast.    [provider]    Family History Family History  Problem Relation Age of Onset   Heart attack Sister        died @ 17   Cancer Mother        died @ 87; unknown type   Diabetes Brother        deceased   Cirrhosis Father        alcohol related   Diabetes Father    Esophageal cancer Neg Hx    Colon cancer Neg Hx    Pancreatic cancer Neg Hx    Stomach cancer Neg Hx     Social History Social History   Tobacco Use   Smoking status: Former    Current packs/day: 0.00    Average packs/day: 1 pack/day for 2.0 years (2.0 ttl pk-yrs)    Types: Cigarettes    Start date: 42    Quit date: 34    Years since quitting: 47.6    Passive exposure: Never   Smokeless tobacco: Never  Vaping Use   Vaping status: Never Used  Substance Use Topics   Alcohol use: Not Currently    Alcohol/week: 0.0 standard drinks of alcohol    Comment: h/o social drinking   Drug use: Not Currently    Types: Marijuana    Comment: quit 1986     Allergies   Contrast media [iodinated contrast media], Kiwi extract, Flexeril  [cyclobenzaprine ], and Tape   Review of Systems Review of Systems  Constitutional:  Negative for chills and fever.  HENT:  Negative for ear pain and sore throat.   Eyes:  Negative for pain and visual disturbance.  Respiratory:  Positive for shortness of breath. Negative for cough.   Cardiovascular:  Negative for chest pain and palpitations.       Face and upper extremity swelling  Gastrointestinal:  Negative for abdominal pain and vomiting.  Genitourinary:  Negative for dysuria and hematuria.  Musculoskeletal:  Negative for arthralgias and back pain.  Skin:  Negative for color change and rash.  Neurological:   Negative for seizures and syncope.  All other systems reviewed and are negative.    Physical Exam Triage Vital Signs ED Triage Vitals [11/05/23 1703]  Encounter Vitals Group     BP (!) 157/79     Girls Systolic BP Percentile      Girls Diastolic BP Percentile      Boys Systolic BP Percentile      Boys Diastolic BP Percentile      Pulse Rate 75     Resp 18     Temp 97.7 F (36.5 C)     Temp Source Oral     SpO2 93 %     Weight      Height      Head Circumference      Peak Flow      Pain Score 5     Pain Loc      Pain Education      Exclude from Growth Chart    No data found.  Updated Vital Signs BP (!) 157/79 (BP Location: Left Arm)   Pulse 75   Temp 97.7 F (36.5 C) (Oral)   Resp 18   SpO2 93%   Visual Acuity Right Eye Distance:   Left Eye Distance:   Bilateral Distance:    Right Eye Near:   Left Eye Near:    Bilateral Near:     Physical Exam Vitals and  nursing note reviewed.  Constitutional:      General: He is not in acute distress.    Appearance: He is well-developed.  HENT:     Head: Normocephalic and atraumatic.     Comments: Mild facial edema Eyes:     Conjunctiva/sclera: Conjunctivae normal.  Cardiovascular:     Rate and Rhythm: Normal rate and regular rhythm.     Pulses:          Carotid pulses are  on the right side with bruit and  on the left side with bruit.    Heart sounds: Murmur heard.  Pulmonary:     Effort: Pulmonary effort is normal. No respiratory distress.     Breath sounds: Examination of the right-upper field reveals decreased breath sounds. Examination of the left-upper field reveals decreased breath sounds. Decreased breath sounds present. No wheezing or rhonchi.  Chest:     Chest wall: No tenderness.  Abdominal:     Palpations: Abdomen is soft.     Tenderness: There is no abdominal tenderness.  Musculoskeletal:        General: No swelling.     Cervical back: Neck supple.     Comments: Bilateral upper erythema edema  especially within the hands.  Pulses are palpable however weaker on the right, capillary refill is intact both upper extremities  Skin:    General: Skin is warm and dry.     Capillary Refill: Capillary refill takes less than 2 seconds.  Neurological:     General: No focal deficit present.     Mental Status: He is alert.  Psychiatric:        Mood and Affect: Mood normal.      UC Treatments / Results  Labs (all labs ordered are listed, but only abnormal results are displayed) Labs Reviewed - No data to display  EKG   Radiology DG Chest 2 View Result Date: 11/05/2023 CLINICAL DATA:  Shortness of breath, worsening today EXAM: CHEST - 2 VIEW COMPARISON:  03/28/2023 FINDINGS: Atherosclerotic calcification of the aortic arch. Central airway thickening noted suggesting bronchitis or reactive airways disease. Accentuated interstitial markings at the right lung base, cannot exclude early bronchopneumonia although the appearance is less striking on the lateral projection. No blunting of the costophrenic angles.  Mild thoracic spondylosis. IMPRESSION: 1. Central airway thickening suggesting bronchitis or reactive airways disease. 2. Accentuated interstitial markings at the right lung base, cannot exclude early bronchopneumonia although the appearance is less striking on the lateral projection. 3. Aortic Atherosclerosis (ICD10-I70.0). Electronically Signed   By: Ryan Salvage M.D.   On: 11/05/2023 18:20    Procedures Procedures (including critical care time)  Medications Ordered in UC Medications - No data to display  Initial Impression / Assessment and Plan / UC Course  I have reviewed the triage vital signs and the nursing notes.  Pertinent labs & imaging results that were available during my care of the patient were reviewed by me and considered in my medical decision making (see chart for details).     Shortness of breath - Plan: DG Chest 2 View, DG Chest 2 View  Facial  swelling  Left upper extremity swelling  Swelling of right upper extremity  ESRD on dialysis [facility])   Patient has a concerning presentation given the increased shortness of breath, facial swelling and upper extremity swelling.  He did miss dialysis this morning.  His EKG done today was normal.  His chest x-ray is suggestive of reactive airway disease and possible bronchial  pneumonia.  The patient has had numerous accesses in his upper extremities and the upper extremity edema as well as the facial edema with increased shortness of breath is very concerning to us .  We have recommended that the patient go to the emergency room where a more extensive evaluation can be performed to ensure that there is no other issues such as superior vena cava syndrome versus a upper extremity DVT versus pulmonary edema.  Patient is agreeable and will have his daughter take him.  This is reasonable as his vital signs are stable.  Final Clinical Impressions(s) / UC Diagnoses   Final diagnoses:  Shortness of breath  Facial swelling  Left upper extremity swelling  Swelling of right upper extremity  ESRD on dialysis [facility])     Discharge Instructions      Due to the increased shortness of breath, facial swelling and upper extremity swelling along with the chest x-ray findings, we recommend further evaluation at the emergency room where lab work and further imaging that is more advanced can be performed.     ED Prescriptions   None    PDMP not reviewed this encounter.   Teresa Almarie LABOR, [place] 11/05/23 1826

## 2023-11-05 NOTE — ED Provider Triage Note (Signed)
 Emergency Medicine Provider Triage Evaluation Note  AMATO SEVILLANO , a 73 y.o. male  was evaluated in triage.  Pt complains of shortness of breath.  Missed his dialysis session.  Endorses swelling to his hands.  Review of Systems  Positive: As above Negative: As above  Physical Exam  BP (!) 149/76   Pulse 71   Temp 97.6 F (36.4 C)   Resp 18   Ht 5' 9 (1.753 m)   Wt 81.6 kg   SpO2 97%   BMI 26.58 kg/m  Gen:   Awake, no distress   Resp:  Normal effort  MSK:   Moves extremities without difficulty  Other:    Medical Decision Making  Medically screening exam initiated at 7:10 PM.  Appropriate orders placed.  Lamar LITTIE Geralds was informed that the remainder of the evaluation will be completed by another provider, this initial triage assessment does not replace that evaluation, and the importance of remaining in the ED until their evaluation is complete.     Hildegard Loge, PA-C 11/05/23 1910

## 2023-11-05 NOTE — ED Provider Notes (Signed)
 [place] EMERGENCY DEPARTMENT AT [facility] Provider Note   CSN: [id number] Arrival date & time: 11/05/23  1835     Patient presents with: Shortness of Breath   Jon Anderson is a 73 y.o. male here with complaint of swelling of upper extremities, shortness of breath.  Pt goes to dialysis for ESRD, missed today.  Went to UC and was sent here for further evaluation.  UC Xray reviewed, report:  IMPRESSION: 1. Central airway thickening suggesting bronchitis or reactive airways disease. 2. Accentuated interstitial markings at the right lung base, cannot exclude early bronchopneumonia although the appearance is less striking on the lateral projection. 3. Aortic Atherosclerosis (ICD10-I70.0).   HPI     Prior to Admission medications   Medication Sig Start Date End Date Taking? Authorizing Provider  aspirin  EC 81 MG tablet Take 81 mg by mouth daily. Swallow whole.    [provider]  calcium  acetate (PHOSLO ) 667 MG capsule Take 2 capsules (1,334 mg total) by mouth 2 (two) times daily with a meal. 12/01/19   Angiulli, Toribio PARAS, PA-C  clopidogrel  (PLAVIX ) 75 MG tablet Take 75 mg by mouth in the morning. 12/01/19   [provider]  gabapentin  (NEURONTIN ) 100 MG capsule Take 100-200 mg by mouth See admin instructions. 100 mg in the morning, 200 mg in the evening 11/28/19   [provider]  multivitamin (RENA-VIT) TABS tablet Take 1 tablet by mouth at bedtime. 12/01/19   Angiulli, Toribio PARAS, PA-C  oxyCODONE -acetaminophen  (PERCOCET) 5-325 MG tablet Take 1 tablet by mouth every 6 (six) hours as needed. Patient not taking: Reported on 07/17/2023 06/19/23   Rhyne, Samantha J, PA-C  pantoprazole  (PROTONIX ) 40 MG tablet Take 40 mg by mouth daily before breakfast.    [provider]    Allergies: Contrast media [iodinated contrast media], Kiwi extract, Flexeril  [cyclobenzaprine ], and Tape    Review of Systems  Updated Vital Signs BP (!) 153/81    Pulse 82   Temp 98 F (36.7 C)   Resp 19   Ht 5' 9 (1.753 m)   Wt 81.6 kg   SpO2 96%   BMI 26.58 kg/m   Physical Exam Constitutional:      General: He is not in acute distress. HENT:     Head: Normocephalic and atraumatic.  Eyes:     Conjunctiva/sclera: Conjunctivae normal.     Pupils: Pupils are equal, round, and reactive to light.  Cardiovascular:     Rate and Rhythm: Normal rate and regular rhythm.  Pulmonary:     Effort: Pulmonary effort is normal. No respiratory distress.  Abdominal:     General: There is no distension.     Tenderness: There is no abdominal tenderness.  Skin:    General: Skin is warm and dry.     Comments: Edema of upper extremities  Neurological:     General: No focal deficit present.     Mental Status: He is alert. Mental status is at baseline.  Psychiatric:        Mood and Affect: Mood normal.        Behavior: Behavior normal.     (all labs ordered are listed, but only abnormal results are displayed) Labs Reviewed - No data to display   EKG: None  Radiology: CT Chest Wo Contrast Result Date: 11/05/2023 CLINICAL DATA:  shortness of breath EXAM: CT CHEST WITHOUT CONTRAST TECHNIQUE: Multidetector CT imaging of the chest was performed following the standard protocol without IV contrast. RADIATION  DOSE REDUCTION: This exam was performed according to the departmental dose-optimization program which includes automated exposure control, adjustment of the mA and/or kV according to patient size and/or use of iterative reconstruction technique. COMPARISON:  July 17, 2018 FINDINGS: Cardiovascular: Mild cardiomegaly. No pericardial effusion. No aortic aneurysm. Dense multi-vessel coronary atherosclerosis. Diffuse aortic atherosclerosis. Mediastinum/Nodes: No mediastinal mass. No mediastinal, hilar, or axillary lymphadenopathy. Lungs/Pleura: The midline trachea and bronchi are patent. Posterior bibasilar dependent atelectasis. No focal airspace consolidation,  pleural effusion, or pneumothorax. Musculoskeletal: No acute fracture or destructive bone lesion. Moderate volume symmetric bilateral gynecomastia. Thoracic DISH. Upper Abdomen: No acute abnormality in the partially visualized upper abdomen. Unchanged nodular thickening of both adrenal glands, likely due to underlying adenomas. IMPRESSION: 1. No acute intrathoracic abnormality. Specifically, no pneumonia, pulmonary edema, or pleural effusion. 2. Mild cardiomegaly.  Dense multi-vessel coronary atherosclerosis. 3. Moderate volume symmetric bilateral gynecomastia. Aortic Atherosclerosis (ICD10-I70.0). Electronically Signed   By: Rogelia Myers M.D.   On: 11/05/2023 21:56   DG Chest 2 View Result Date: 11/05/2023 CLINICAL DATA:  Shortness of breath, worsening today EXAM: CHEST - 2 VIEW COMPARISON:  03/28/2023 FINDINGS: Atherosclerotic calcification of the aortic arch. Central airway thickening noted suggesting bronchitis or reactive airways disease. Accentuated interstitial markings at the right lung base, cannot exclude early bronchopneumonia although the appearance is less striking on the lateral projection. No blunting of the costophrenic angles.  Mild thoracic spondylosis. IMPRESSION: 1. Central airway thickening suggesting bronchitis or reactive airways disease. 2. Accentuated interstitial markings at the right lung base, cannot exclude early bronchopneumonia although the appearance is less striking on the lateral projection. 3. Aortic Atherosclerosis (ICD10-I70.0). Electronically Signed   By: Ryan Salvage M.D.   On: 11/05/2023 18:20     Procedures   Medications Ordered in the ED  HYDROcodone -acetaminophen  (NORCO/VICODIN) 5-325 MG per tablet 1 tablet (has no administration in time range)  clopidogrel  (PLAVIX ) tablet 75 mg (has no administration in time range)  calcium  acetate (PHOSLO ) capsule 1,334 mg (has no administration in time range)  aspirin  EC tablet 81 mg (has no administration in time  range)  gabapentin  (NEURONTIN ) capsule 100-200 mg (has no administration in time range)  multivitamin (RENA-VIT) tablet 1 tablet (has no administration in time range)  pantoprazole  (PROTONIX ) EC tablet 40 mg (has no administration in time range)                                    Medical Decision Making Amount and/or Complexity of Data Reviewed Radiology: ordered.  Risk Prescription drug management.   This patient presents to the ED with concern for upper extremity swelling, shortness of breath  Co-morbidities that complicate the patient evaluation: ESRD on dialysis  I ordered imaging studies including CT chest without contrast I independently visualized and interpreted imaging which showed no emergent findings I agree with the radiologist interpretation  The patient was maintained on a cardiac monitor.  I personally viewed and interpreted the cardiac monitored which showed an underlying rhythm of: regular heart rate  I ordered medication including home chronic pain meds  I have reviewed the patients home medicines and have made adjustments as needed  The patient is breathing very comfortably on room air, has no hypoxia or tachypnea. I do not suspect volume overload.  He has missed a single dialysis session, and he is not needing emergency dialysis in the ER.  I expect he could follow up for his  next regular session in 2 days.  Multiple attempts were made for labs, but unsuccessful - and given no emergent need for blood, we will discontinue further attempts.  We did discuss vascular ultrasound imaging for DVT/thrombus evaluation given his arm swelling.  The patient has limited mobility and difficulty getting home and returning early tomorrow to complete this workup.  We will keep him overnight and have the DVT ultrasound performed in the morning.  If there is no acute thrombus, he can follow up with his nephrologist and dialysis center on Wednesday.      Final diagnoses:  Arm  swelling    ED Discharge Orders     None          Cottie Donnice PARAS, MD 11/05/23 2340

## 2023-11-05 NOTE — ED Notes (Signed)
 Unable to get blood or IV access via vascular RN and provider.  Provider ok to DC labs.

## 2023-11-06 ENCOUNTER — Emergency Department (HOSPITAL_COMMUNITY)

## 2023-11-06 DIAGNOSIS — R2233 Localized swelling, mass and lump, upper limb, bilateral: Secondary | ICD-10-CM | POA: Diagnosis not present

## 2023-11-06 DIAGNOSIS — M7989 Other specified soft tissue disorders: Secondary | ICD-10-CM

## 2023-11-06 MED ORDER — GABAPENTIN 100 MG PO CAPS
100.0000 mg | ORAL_CAPSULE | Freq: Every day | ORAL | Status: DC
  Administered 2023-11-06: 100 mg via ORAL
  Filled 2023-11-06: qty 1

## 2023-11-06 MED ORDER — GABAPENTIN 100 MG PO CAPS
200.0000 mg | ORAL_CAPSULE | Freq: Every day | ORAL | Status: DC
  Administered 2023-11-06: 200 mg via ORAL
  Filled 2023-11-06: qty 2

## 2023-11-06 NOTE — Discharge Instructions (Addendum)
 Follow-up with your nephrologist, make sure you go to your next dialysis session.

## 2023-11-06 NOTE — ED Provider Notes (Addendum)
 DVT studies unremarkable.  He is very comfortable.  Will discharge.  He was seen here overnight and was pending a DVT study to his upper extremities.  They found no evidence of DVT or other acute findings.  He has normal vitals.  Normal work of breathing.  CT scan of his chest earlier in the evening showed no evidence of volume overload.   This chart was dictated using voice recognition software.  Despite best efforts to proofread,  errors can occur which can change the documentation meaning.   Ruthe Cornet, DO 11/06/23 1003    Ruthe Cornet, DO 11/06/23 1003

## 2023-11-20 ENCOUNTER — Encounter (HOSPITAL_COMMUNITY): Payer: Self-pay

## 2023-11-22 ENCOUNTER — Encounter (HOSPITAL_COMMUNITY): Payer: Self-pay

## 2023-12-20 ENCOUNTER — Encounter: Payer: Self-pay | Admitting: Cardiovascular Disease

## 2023-12-26 LAB — LAB REPORT - SCANNED: EGFR: 5

## 2024-01-01 ENCOUNTER — Ambulatory Visit (HOSPITAL_COMMUNITY)
Admission: RE | Admit: 2024-01-01 | Discharge: 2024-01-01 | Disposition: A | Discharge status: Home / Self Care | Source: Ambulatory Visit | Attending: Cardiovascular Disease | Admitting: Cardiovascular Disease

## 2024-01-01 ENCOUNTER — Ambulatory Visit: Payer: Self-pay | Admitting: Cardiovascular Disease

## 2024-01-01 DIAGNOSIS — I6522 Occlusion and stenosis of left carotid artery: Secondary | ICD-10-CM | POA: Diagnosis not present

## 2024-01-16 ENCOUNTER — Inpatient Hospital Stay (HOSPITAL_COMMUNITY)
Admission: EM | Admit: 2024-01-16 | Discharge: 2024-01-19 | DRG: 377 | Disposition: A | Discharge status: Home / Self Care | Attending: Internal Medicine | Admitting: Internal Medicine

## 2024-01-16 ENCOUNTER — Other Ambulatory Visit: Payer: Self-pay

## 2024-01-16 ENCOUNTER — Emergency Department (HOSPITAL_COMMUNITY)

## 2024-01-16 DIAGNOSIS — D649 Anemia, unspecified: Secondary | ICD-10-CM | POA: Diagnosis present

## 2024-01-16 DIAGNOSIS — Z87892 Personal history of anaphylaxis: Secondary | ICD-10-CM

## 2024-01-16 DIAGNOSIS — E1122 Type 2 diabetes mellitus with diabetic chronic kidney disease: Secondary | ICD-10-CM | POA: Diagnosis present

## 2024-01-16 DIAGNOSIS — G8929 Other chronic pain: Secondary | ICD-10-CM | POA: Diagnosis present

## 2024-01-16 DIAGNOSIS — Z888 Allergy status to other drugs, medicaments and biological substances status: Secondary | ICD-10-CM

## 2024-01-16 DIAGNOSIS — R7989 Other specified abnormal findings of blood chemistry: Secondary | ICD-10-CM | POA: Diagnosis present

## 2024-01-16 DIAGNOSIS — Z87442 Personal history of urinary calculi: Secondary | ICD-10-CM

## 2024-01-16 DIAGNOSIS — E1151 Type 2 diabetes mellitus with diabetic peripheral angiopathy without gangrene: Secondary | ICD-10-CM | POA: Diagnosis present

## 2024-01-16 DIAGNOSIS — Z8674 Personal history of sudden cardiac arrest: Secondary | ICD-10-CM

## 2024-01-16 DIAGNOSIS — N186 End stage renal disease: Secondary | ICD-10-CM | POA: Diagnosis present

## 2024-01-16 DIAGNOSIS — G2581 Restless legs syndrome: Secondary | ICD-10-CM | POA: Diagnosis present

## 2024-01-16 DIAGNOSIS — I120 Hypertensive chronic kidney disease with stage 5 chronic kidney disease or end stage renal disease: Secondary | ICD-10-CM | POA: Diagnosis present

## 2024-01-16 DIAGNOSIS — Z91041 Radiographic dye allergy status: Secondary | ICD-10-CM | POA: Diagnosis not present

## 2024-01-16 DIAGNOSIS — K921 Melena: Principal | ICD-10-CM | POA: Diagnosis present

## 2024-01-16 DIAGNOSIS — K219 Gastro-esophageal reflux disease without esophagitis: Secondary | ICD-10-CM | POA: Diagnosis present

## 2024-01-16 DIAGNOSIS — D696 Thrombocytopenia, unspecified: Secondary | ICD-10-CM | POA: Diagnosis present

## 2024-01-16 DIAGNOSIS — Z992 Dependence on renal dialysis: Secondary | ICD-10-CM

## 2024-01-16 DIAGNOSIS — I2510 Atherosclerotic heart disease of native coronary artery without angina pectoris: Secondary | ICD-10-CM | POA: Diagnosis present

## 2024-01-16 DIAGNOSIS — Z9861 Coronary angioplasty status: Secondary | ICD-10-CM

## 2024-01-16 DIAGNOSIS — R710 Precipitous drop in hematocrit: Secondary | ICD-10-CM | POA: Diagnosis present

## 2024-01-16 DIAGNOSIS — I8500 Esophageal varices without bleeding: Secondary | ICD-10-CM | POA: Diagnosis present

## 2024-01-16 DIAGNOSIS — Z7982 Long term (current) use of aspirin: Secondary | ICD-10-CM

## 2024-01-16 DIAGNOSIS — E785 Hyperlipidemia, unspecified: Secondary | ICD-10-CM | POA: Diagnosis present

## 2024-01-16 DIAGNOSIS — N2581 Secondary hyperparathyroidism of renal origin: Secondary | ICD-10-CM | POA: Diagnosis present

## 2024-01-16 DIAGNOSIS — K3189 Other diseases of stomach and duodenum: Secondary | ICD-10-CM | POA: Diagnosis present

## 2024-01-16 DIAGNOSIS — Z5948 Other specified lack of adequate food: Secondary | ICD-10-CM

## 2024-01-16 DIAGNOSIS — Z87891 Personal history of nicotine dependence: Secondary | ICD-10-CM | POA: Diagnosis not present

## 2024-01-16 DIAGNOSIS — Z833 Family history of diabetes mellitus: Secondary | ICD-10-CM | POA: Diagnosis not present

## 2024-01-16 DIAGNOSIS — K2980 Duodenitis without bleeding: Secondary | ICD-10-CM | POA: Diagnosis present

## 2024-01-16 DIAGNOSIS — E538 Deficiency of other specified B group vitamins: Secondary | ICD-10-CM | POA: Diagnosis present

## 2024-01-16 DIAGNOSIS — I10 Essential (primary) hypertension: Secondary | ICD-10-CM | POA: Diagnosis present

## 2024-01-16 DIAGNOSIS — Z91018 Allergy to other foods: Secondary | ICD-10-CM

## 2024-01-16 DIAGNOSIS — K922 Gastrointestinal hemorrhage, unspecified: Secondary | ICD-10-CM | POA: Diagnosis present

## 2024-01-16 DIAGNOSIS — Z8249 Family history of ischemic heart disease and other diseases of the circulatory system: Secondary | ICD-10-CM | POA: Diagnosis not present

## 2024-01-16 DIAGNOSIS — Z951 Presence of aortocoronary bypass graft: Secondary | ICD-10-CM | POA: Diagnosis not present

## 2024-01-16 DIAGNOSIS — Z7902 Long term (current) use of antithrombotics/antiplatelets: Secondary | ICD-10-CM | POA: Diagnosis not present

## 2024-01-16 DIAGNOSIS — Z79891 Long term (current) use of opiate analgesic: Secondary | ICD-10-CM

## 2024-01-16 DIAGNOSIS — Z91048 Other nonmedicinal substance allergy status: Secondary | ICD-10-CM

## 2024-01-16 DIAGNOSIS — Z5941 Food insecurity: Secondary | ICD-10-CM

## 2024-01-16 DIAGNOSIS — N400 Enlarged prostate without lower urinary tract symptoms: Secondary | ICD-10-CM | POA: Diagnosis present

## 2024-01-16 DIAGNOSIS — Z8711 Personal history of peptic ulcer disease: Secondary | ICD-10-CM

## 2024-01-16 DIAGNOSIS — E119 Type 2 diabetes mellitus without complications: Secondary | ICD-10-CM

## 2024-01-16 DIAGNOSIS — I959 Hypotension, unspecified: Secondary | ICD-10-CM | POA: Diagnosis present

## 2024-01-16 DIAGNOSIS — K2970 Gastritis, unspecified, without bleeding: Secondary | ICD-10-CM | POA: Diagnosis present

## 2024-01-16 DIAGNOSIS — K5909 Other constipation: Secondary | ICD-10-CM | POA: Diagnosis present

## 2024-01-16 LAB — PREPARE RBC (CROSSMATCH)

## 2024-01-16 LAB — COMPREHENSIVE METABOLIC PANEL WITH GFR
ALT: 9 U/L (ref 0–44)
AST: 12 U/L — ABNORMAL LOW (ref 15–41)
Albumin: 2.7 g/dL — ABNORMAL LOW (ref 3.5–5.0)
Alkaline Phosphatase: 52 U/L (ref 38–126)
Anion gap: 13 (ref 5–15)
BUN: 32 mg/dL — ABNORMAL HIGH (ref 8–23)
CO2: 30 mmol/L (ref 22–32)
Calcium: 8.7 mg/dL — ABNORMAL LOW (ref 8.9–10.3)
Chloride: 94 mmol/L — ABNORMAL LOW (ref 98–111)
Creatinine, Ser: 5.07 mg/dL — ABNORMAL HIGH (ref 0.61–1.24)
GFR, Estimated: 11 mL/min — ABNORMAL LOW (ref >60)
Glucose, Bld: 139 mg/dL — ABNORMAL HIGH (ref 70–99)
Potassium: 3.4 mmol/L — ABNORMAL LOW (ref 3.5–5.1)
Sodium: 137 mmol/L (ref 135–145)
Total Bilirubin: 0.3 mg/dL (ref 0.0–1.2)
Total Protein: 5.6 g/dL — ABNORMAL LOW (ref 6.5–8.1)

## 2024-01-16 LAB — CBC WITH DIFFERENTIAL/PLATELET
Abs Immature Granulocytes: 0.02 K/uL (ref 0.00–0.07)
Basophils Absolute: 0 K/uL (ref 0.0–0.1)
Basophils Relative: 0 %
Eosinophils Absolute: 0.2 K/uL (ref 0.0–0.5)
Eosinophils Relative: 3 %
HCT: 22.4 % — ABNORMAL LOW (ref 39.0–52.0)
Hemoglobin: 7 g/dL — ABNORMAL LOW (ref 13.0–17.0)
Immature Granulocytes: 0 %
Lymphocytes Relative: 18 %
Lymphs Abs: 1 K/uL (ref 0.7–4.0)
MCH: 29.8 pg (ref 26.0–34.0)
MCHC: 31.3 g/dL (ref 30.0–36.0)
MCV: 95.3 fL (ref 80.0–100.0)
Monocytes Absolute: 0.5 K/uL (ref 0.1–1.0)
Monocytes Relative: 9 %
Neutro Abs: 3.7 K/uL (ref 1.7–7.7)
Neutrophils Relative %: 70 %
Platelets: 168 K/uL (ref 150–400)
RBC: 2.35 MIL/uL — ABNORMAL LOW (ref 4.22–5.81)
RDW: 14.2 % (ref 11.5–15.5)
WBC: 5.5 K/uL (ref 4.0–10.5)
nRBC: 0 % (ref 0.0–0.2)

## 2024-01-16 LAB — TROPONIN I (HIGH SENSITIVITY)
Troponin I (High Sensitivity): 31 ng/L — ABNORMAL HIGH (ref <18)
Troponin I (High Sensitivity): 31 ng/L — ABNORMAL HIGH (ref <18)

## 2024-01-16 LAB — POC OCCULT BLOOD, ED: Fecal Occult Bld: POSITIVE — AB

## 2024-01-16 MED ORDER — PANTOPRAZOLE SODIUM 40 MG IV SOLR: 40.0000 mg | Freq: Once | INTRAVENOUS | Status: DC

## 2024-01-16 MED ORDER — HYDROMORPHONE HCL 2 MG PO TABS
2.0000 mg | ORAL_TABLET | ORAL | Status: DC | PRN
  Administered 2024-01-16 – 2024-01-18 (×6): 2 mg via ORAL
  Filled 2024-01-16 (×7): qty 1

## 2024-01-16 MED ORDER — ACETAMINOPHEN 325 MG PO TABS: 650.0000 mg | ORAL_TABLET | Freq: Four times a day (QID) | ORAL | Status: DC | PRN

## 2024-01-16 MED ORDER — SODIUM CHLORIDE 0.9% IV SOLUTION: Freq: Once | INTRAVENOUS | Status: DC

## 2024-01-16 MED ORDER — LACTATED RINGERS IV BOLUS
1000.0000 mL | Freq: Once | INTRAVENOUS | Status: AC
  Administered 2024-01-16: 1000 mL via INTRAVENOUS

## 2024-01-16 MED ORDER — ACETAMINOPHEN 650 MG RE SUPP: 650.0000 mg | Freq: Four times a day (QID) | RECTAL | Status: DC | PRN

## 2024-01-16 MED ORDER — PANTOPRAZOLE SODIUM 40 MG IV SOLR
40.0000 mg | Freq: Two times a day (BID) | INTRAVENOUS | Status: DC
Start: 2024-01-16 — End: 2024-01-19
  Administered 2024-01-16 – 2024-01-19 (×6): 40 mg via INTRAVENOUS
  Filled 2024-01-16 (×6): qty 10

## 2024-01-16 NOTE — ED Triage Notes (Signed)
 According to ems:Pt is arriving from home, pt awoke at 0400 with R sided chest pain radiating towards center. Pt went to dialysis and had his full treatment (mwf). Chest pain continued through the treatment and pt became sob. When he arrived home pts family described him as breathing oddly. Pt was found to be 85% SpO2 on room air. Pt stayed on 100% after being placed on rebreathe however had strange breathing pattern and had to be reminded by ems to breath deeply. Bp 94/50 so ems gave pt 150 cc of fluid ns. 12 EKG unremarkable outside occasional pvc. Pt also reports having dark colored stools.  Vitals BP 94/50 RR 16-30 Hr 80 Cbg 154

## 2024-01-16 NOTE — Consult Note (Signed)
 Reason for Consult: Melena with severe anemia . Referring Physician: [facility] hospital[facility]  Jon Anderson is an 73 y.o. male.  HPI: Jon Anderson is a 73 year old black male with multiple medical problems listed below, presented to the emergency room with a 6-day history of melenic stools generalized weakness and shortness of breath with exertion. He has history of end-stage renal disease and is on hemodialysis M-W-F; he completed his dialysis session today and went home and felt significantly weak which prompted him to come to the emergency room he has chronic back pain but since last Friday he has been having black stools he denies having any bright red bleeding per rectum. There is no history of nausea but he did vomit clear liquids once today. He has a history of chronic constipation where he can go 3 to 4 days without having a BM.  His CBC on admission revealed a hemoglobin to be at 7 g/dL from baseline of 10 g/dL. Stools were FOBT positive. He has a longstanding history of acid reflux and also has a known hiatal hernia. He has had an EGD done on 09/13/23 when he was noted to have Grade II esophageal along with non-bleeding gastric ulcers, portal gastropathy and duodenitis; he had a normal colonoscopy done on the same day as well. He denies the use of NSAIDS and is on Plavix .  Past Medical History:  Diagnosis Date   Anemia of chronic disease    Arthritis    CAD (coronary artery disease)    Carotid stenosis    a. <50% RICA, >70% LICA by duplex 07/2015   Chronic chest pain    occ   Constipation    chronic   Dyspnea    occasional with extertion   ESRD (end stage renal disease) on dialysis (HCC)    M-W-F- [facility]   GERD (gastroesophageal reflux disease)    History of hiatal hernia    History of kidney stones    Passed   HNP (herniated nucleus pulposus), lumbar    HTN (hypertension)    echo 3/10: EF 60%, LAE   Hyperlipidemia    Mitral stenosis    mild MS 11/15/21   Nephrolithiasis     passed them all   PEA (Pulseless electrical activity) (HCC) 11/15/2021   PEA arrest felt likely related to contrast anaphylaxis despite premedication   Peripheral arterial disease    a. s/p PTCA  right    Pneumonia yrs ago   Restless legs    Sciatic leg pain    Sleep apnea    does not use cpap   Snores    a. presumed OSA, pt has refused sleep eval in past.   Type II diabetes mellitus (HCC)    no longer on medications, checks blood glucose at home   Urinary frequency    Uses wheelchair    Walker as ambulation aid    Past Surgical History:  Procedure Laterality Date   A/V FISTULAGRAM Left 01/10/2021   Procedure: A/V FISTULAGRAM;  Surgeon: Sheree Penne Bruckner, MD;  Location: [facility] INVASIVE CV LAB;  Service: Cardiovascular;  Laterality: Left;   A/V FISTULAGRAM Left 05/19/2021   Procedure: A/V Fistulagram;  Surgeon: Gretta Bruckner PARAS, MD;  Location: [facility] INVASIVE CV LAB;  Service: Cardiovascular;  Laterality: Left;   ABDOMINAL AORTOGRAM W/LOWER EXTREMITY Bilateral 08/21/2019   Procedure: ABDOMINAL AORTOGRAM W/LOWER EXTREMITY;  Surgeon: Sheree Penne Bruckner, MD;  Location: [facility] INVASIVE CV LAB;  Service: Cardiovascular;  Laterality: Bilateral;   AMPUTATION Right 07/25/2019  Procedure: RIGHT 5th RAY AMPUTATION;  Surgeon: Harden Jerona GAILS, MD;  Location: [facility] OR;  Service: Orthopedics;  Laterality: Right;   AMPUTATION Right 11/19/2019   Procedure: RIGHT TRANSMETATARSAL AMPUTATION;  Surgeon: Harden Jerona GAILS, MD;  Location: [facility] OR;  Service: Orthopedics;  Laterality: Right;   ANGIOPLASTY / STENTING FEMORAL Left 12/11/2013   dr berry   AV FISTULA PLACEMENT Left 03/19/2014   Procedure: CREATION OF ARTERIOVENOUS (AV) FISTULA  LEFT UPPER ARM;  Surgeon: Lynwood JONETTA Collum, MD;  Location: [facility] OR;  Service: Vascular;  Laterality: Left;   AV FISTULA PLACEMENT Left 11/09/2020   Procedure: LEFT ARM ARTERIOVENOUS (AV) FISTULA CREATION;  Surgeon: Sheree Penne Bruckner, MD;  Location: [facility] OR;  Service:  Vascular;  Laterality: Left;   AV FISTULA PLACEMENT Left 02/15/2021   Procedure: INSERTION OF LEFT ARM ARTERIOVENOUS GORE-TEX GRAFT;  Surgeon: Sheree Penne Bruckner, MD;  Location: [facility] OR;  Service: Vascular;  Laterality: Left;   AV FISTULA PLACEMENT Right 02/16/2022   Procedure: RIGHT ARM BRACHIOCEPAHLIC ARTERIOVENOUS (AV) FISTULA CREATION;  Surgeon: Sheree Penne Bruckner, MD;  Location: [facility] OR;  Service: Vascular;  Laterality: Right;  Regional and MAC   AV FISTULA PLACEMENT Right 06/19/2023   Procedure: INSERTION OF ARTERIOVENOUS (AV) GORE-TEX GRAFT RIGHT ARM;  Surgeon: Pearline Norman RAMAN, MD;  Location: [facility] OR;  Service: Vascular;  Laterality: Right;   BACK SURGERY  01/2018   screws placed    BASCILIC VEIN TRANSPOSITION Left 12/14/2020   Procedure: REVISION OF LEFT ARM SECOND STAGE BASILIC VEIN TRANSPOSITION;  Surgeon: Sheree Penne Bruckner, MD;  Location: [facility] OR;  Service: Vascular;  Laterality: Left;   BASCILIC VEIN TRANSPOSITION Right 04/18/2022   Procedure: RIGHT BRACHIOBASILIC FISTULA SECOND STAGE TRANSPOSITION;  Surgeon: Sheree Penne Bruckner, MD;  Location: [facility] OR;  Service: Vascular;  Laterality: Right;   CARDIAC CATHETERIZATION  2001 and 2010    COLONOSCOPY W/ BIOPSIES AND POLYPECTOMY     COLONOSCOPY WITH PROPOFOL  N/A 08/01/2016   Procedure: COLONOSCOPY WITH PROPOFOL ;  Surgeon: Belvie Just, MD;  Location: [facility];  Service: Endoscopy;  Laterality: N/A;   COLONOSCOPY WITH PROPOFOL  N/A 01/17/2022   Procedure: COLONOSCOPY WITH PROPOFOL ;  Surgeon: Just Belvie, MD;  Location: [facility];  Service: Gastroenterology;  Laterality: N/A;   CORONARY ARTERY BYPASS GRAFT  2019   [facility] x [facility] bypass   ESOPHAGOGASTRODUODENOSCOPY (EGD) WITH PROPOFOL  N/A 08/01/2016   Procedure: ESOPHAGOGASTRODUODENOSCOPY (EGD) WITH PROPOFOL ;  Surgeon: Belvie Just, MD;  Location: [facility];  Service: Endoscopy;  Laterality: N/A;   FISTULA SUPERFICIALIZATION Left 02/10/2020   Procedure: LEFT  ARTERIOVENOUS FISTULA PLICATION OF DISTAL ANEURYSM;  Surgeon: Oris Krystal FALCON, MD;  Location: [facility] OR;  Service: Vascular;  Laterality: Left;   FISTULA SUPERFICIALIZATION Left 03/23/2020   Procedure: LEFT UPPER EXTREMITY ARTERIOVENOUS FISTULA PLICATION;  Surgeon: Oris Krystal FALCON, MD;  Location: [facility] OR;  Service: Vascular;  Laterality: Left;   FOOT FRACTURE SURGERY Right    ligament repair   FRACTURE SURGERY     left forearm   GRAFT APPLICATION Right 05/13/2019   Procedure: FAT GRAFT APPLICATION;  Surgeon: Gretel Ozell PARAS, DPM;  Location: [facility] ;  Service: Podiatry;  Laterality: Right;   HEMATOMA EVACUATION Left 11/09/2020   Procedure: EVACUATION HEMATOMA LEFT ARM;  Surgeon: Eliza Bruckner RAMAN, MD;  Location: [facility] OR;  Service: Vascular;  Laterality: Left;   INGUINAL HERNIA REPAIR Left    IR AV DIALY SHUNT INTRO NEEDLE/INTRACATH INITIAL W/PTA/IMG LEFT  11/15/2021   IR AV DIALY SHUNT INTRO NEEDLE/INTRACATH  INITIAL W/PTA/IMG RIGHT Right 08/15/2022   IR FLUORO GUIDE CV LINE LEFT  01/26/2023   IR FLUORO GUIDE CV LINE RIGHT  01/20/2022   IR FLUORO GUIDE CV LINE RIGHT  05/03/2022   IR REMOVAL TUN CV CATH W/O FL  06/16/2021   IR REMOVAL TUN CV CATH W/O FL  07/06/2022   IR US  GUIDE VASC ACCESS LEFT  11/15/2021   IR US  GUIDE VASC ACCESS LEFT  11/15/2021   IR US  GUIDE VASC ACCESS LEFT  01/26/2023   IR US  GUIDE VASC ACCESS RIGHT  11/15/2021   IR US  GUIDE VASC ACCESS RIGHT  01/20/2022   IR US  GUIDE VASC ACCESS RIGHT  08/15/2022   LEFT HEART CATH AND CORS/GRAFTS ANGIOGRAPHY N/A 04/27/2017   Procedure: LEFT HEART CATH AND CORS/GRAFTS ANGIOGRAPHY;  Surgeon: Anner Alm ORN, MD;  Location: [facility] INVASIVE CV LAB;  Service: Cardiovascular;  Laterality: N/A;   LEFT HEART CATHETERIZATION WITH CORONARY ANGIOGRAM N/A 06/22/2014   Procedure: LEFT HEART CATHETERIZATION WITH CORONARY ANGIOGRAM;  Surgeon: Debby DELENA Sor, MD;  Location: [facility] CATH LAB;  Service: Cardiovascular;  Laterality: N/A;   LOWER EXTREMITY ANGIOGRAM  Left 12/11/2013   Procedure: LOWER EXTREMITY ANGIOGRAM;  Surgeon: Dorn JINNY Lesches, MD;  Location: [facility] CATH LAB;  Service: Cardiovascular;  Laterality: Left;   LUMBAR LAMINECTOMY/DECOMPRESSION MICRODISCECTOMY Right 07/03/2017   Procedure: MICRODISCECTOMY LUMBAR FIVE - SACRAL ONE RIGHT;  Surgeon: Lanis Pupa, MD;  Location: [facility] OR;  Service: Neurosurgery;  Laterality: Right;   LUMBAR LAMINECTOMY/DECOMPRESSION MICRODISCECTOMY Right 10/19/2017   Procedure: MICRODISCECTOMY LUMBAR FIVE- SACRAL 1 ONE ;  Surgeon: Lanis Pupa, MD;  Location: [facility] OR;  Service: Neurosurgery;  Laterality: Right;   PERIPHERAL VASCULAR BALLOON ANGIOPLASTY Left 05/19/2021   Procedure: PERIPHERAL VASCULAR BALLOON ANGIOPLASTY;  Surgeon: Gretta Lonni JINNY, MD;  Location: [facility] INVASIVE CV LAB;  Service: Cardiovascular;  Laterality: Left;   PERIPHERAL VASCULAR INTERVENTION Left 01/10/2021   Procedure: PERIPHERAL VASCULAR INTERVENTION;  Surgeon: Sheree Penne Lonni, MD;  Location: [facility] INVASIVE CV LAB;  Service: Cardiovascular;  Laterality: Left;   POLYPECTOMY  01/17/2022   Procedure: POLYPECTOMY;  Surgeon: Rollin Dover, MD;  Location: [facility];  Service: Gastroenterology;;   THROMBECTOMY W/ EMBOLECTOMY Left 11/09/2020   Procedure: THROMBECTOMY OF LEFT ARTERIOVENOUS FISTULA;  Surgeon: Eliza Lonni RAMAN, MD;  Location: [facility] OR;  Service: Vascular;  Laterality: Left;   TONSILLECTOMY AND ADENOIDECTOMY     WISDOM TOOTH EXTRACTION     WOUND DEBRIDEMENT Right 05/13/2019   Procedure: DEBRIDEMENT WOUND;  Surgeon: Gretel Ozell JINNY, DPM;  Location: [facility];  Service: Podiatry;  Laterality: Right;    Family History  Problem Relation Age of Onset   Heart attack Sister        died @ 46   Cancer Mother        died @ 19; unknown type   Diabetes Brother        deceased   Cirrhosis Father        alcohol related   Diabetes Father    Esophageal cancer Neg Hx    Colon cancer Neg Hx    Pancreatic cancer Neg  Hx    Stomach cancer Neg Hx     Social History:  reports that he quit smoking about 47 years ago. His smoking use included cigarettes. He started smoking about 49 years ago. He has a 2 pack-year smoking history. He has never been exposed to tobacco smoke. He has never used smokeless tobacco. He reports that he does not currently  use alcohol. He reports that he does not currently use drugs after having used the following drugs: Marijuana.  Allergies:  Allergies  Allergen Reactions   Contrast Media [Iodinated Contrast Media] Anaphylaxis    Cardiac arrest, Throat swelling, hives, SOB   Kiwi Extract Itching, Swelling and Other (See Comments)    Lips and face swell- breathing not affected   Flexeril  [Cyclobenzaprine ]     Hands become flimsy, can not hold things   Tape Other (See Comments)    Plastic tape causes blisters!!   Medications: I have reviewed the patient's current medications. Prior to Admission: (Not in a hospital admission)  Scheduled:  sodium chloride    Intravenous Once   pantoprazole  (PROTONIX ) IV  40 mg Intravenous Once   Continuous: PRN:acetaminophen  **OR** acetaminophen , HYDROmorphone   Results for orders placed or performed during the hospital encounter of 01/16/24 (from the past 48 hours)  CBC with Differential/Platelet     Status: Abnormal   Collection Time: 01/16/24 12:30 PM  Result Value Ref Range   WBC 5.5 4.0 - 10.5 K/uL   RBC 2.35 (L) 4.22 - 5.81 MIL/uL   Hemoglobin 7.0 (L) 13.0 - 17.0 g/dL    Comment: REPEATED TO VERIFY   HCT 22.4 (L) 39.0 - 52.0 %   MCV 95.3 80.0 - 100.0 fL   MCH 29.8 26.0 - 34.0 pg   MCHC 31.3 30.0 - 36.0 g/dL   RDW 85.7 88.4 - 84.4 %   Platelets 168 150 - 400 K/uL   nRBC 0.0 0.0 - 0.2 %   Neutrophils Relative % 70 %   Neutro Abs 3.7 1.7 - 7.7 K/uL   Lymphocytes Relative 18 %   Lymphs Abs 1.0 0.7 - 4.0 K/uL   Monocytes Relative 9 %   Monocytes Absolute 0.5 0.1 - 1.0 K/uL   Eosinophils Relative 3 %   Eosinophils Absolute 0.2 0.0  - 0.5 K/uL   Basophils Relative 0 %   Basophils Absolute 0.0 0.0 - 0.1 K/uL   Immature Granulocytes 0 %   Abs Immature Granulocytes 0.02 0.00 - 0.07 K/uL    Comment: Performed at [facility], [address] [place], [place] [address]  Comprehensive metabolic panel with GFR     Status: Abnormal   Collection Time: 01/16/24 12:30 PM  Result Value Ref Range   Sodium 137 135 - 145 mmol/L   Potassium 3.4 (L) 3.5 - 5.1 mmol/L   Chloride 94 (L) 98 - 111 mmol/L   CO2 30 22 - 32 mmol/L   Glucose, Bld 139 (H) 70 - 99 mg/dL    Comment: Glucose reference range applies only to samples taken after fasting for at least 8 hours.   BUN 32 (H) 8 - 23 mg/dL   Creatinine, Ser 4.92 (H) 0.61 - 1.24 mg/dL   Calcium  8.7 (L) 8.9 - 10.3 mg/dL   Total Protein 5.6 (L) 6.5 - 8.1 g/dL   Albumin  2.7 (L) 3.5 - 5.0 g/dL   AST 12 (L) 15 - 41 U/L   ALT 9 0 - 44 U/L   Alkaline Phosphatase 52 38 - 126 U/L   Total Bilirubin 0.3 0.0 - 1.2 mg/dL   GFR, Estimated 11 (L) >60 mL/min    Comment: (NOTE) Calculated using the CKD-EPI Creatinine Equation (2021)    Anion gap 13 5 - 15    Comment: Performed at [facility], [address], [place], [place] [address]  Troponin I (High Sensitivity)     Status: Abnormal  Collection Time: 01/16/24 12:30 PM  Result Value Ref Range   Troponin I (High Sensitivity) 31 (H) <18 ng/L    Comment: (NOTE) Elevated high sensitivity troponin I (hsTnI) values and significant  changes across serial measurements may suggest ACS but many other  chronic and acute conditions are known to elevate hsTnI results.  Refer to the Links section for chest pain algorithms and additional  guidance. Performed at [facility], [address] [place], [place] [address]   Type and screen     Status: None (Preliminary result)   Collection Time: 01/16/24  1:05 PM  Result Value Ref Range   ABO/RH(D) A POS    Antibody Screen NEG    Sample Expiration      01/19/2024,2359 Performed at  [facility], [address] [place], [place] [address]    Unit Number [id number]    Blood Component Type RED CELLS,LR    Unit division 00    Status of Unit ALLOCATED    Transfusion Status OK TO TRANSFUSE    Crossmatch Result Compatible    Unit Number [id number]    Blood Component Type RED CELLS,LR    Unit division 00    Status of Unit ALLOCATED    Transfusion Status OK TO TRANSFUSE    Crossmatch Result Compatible   POC occult blood, ED RN will collect     Status: Abnormal   Collection Time: 01/16/24  2:35 PM  Result Value Ref Range   Fecal Occult Bld POSITIVE (A) NEGATIVE  Prepare RBC (crossmatch)     Status: None   Collection Time: 01/16/24  3:00 PM  Result Value Ref Range   Order Confirmation      ORDER PROCESSED BY BLOOD BANK Performed at [facility], [address] [place], [place] [address]    DG Chest Port 1 View Result Date: 01/16/2024 CLINICAL DATA:  Shortness of breath. EXAM: PORTABLE CHEST 1 VIEW COMPARISON:  10/28/2023 FINDINGS: Again noted is elevation of the right hemidiaphragm. Lungs are clear. Heart and mediastinum are within normal limits and stable. Atherosclerotic calcifications at the aortic arch. Negative for a pneumothorax. No acute bone abnormality. IMPRESSION: 1. No acute cardiopulmonary disease. 2. Chronic elevation of the right hemidiaphragm. Electronically Signed   By: Juliene Balder M.D.   On: 01/16/2024 13:05   Review of Systems  Constitutional:  Positive for activity change and fatigue. Negative for appetite change, chills, diaphoresis, fever and unexpected weight change.  HENT: Negative.     Blood pressure (!) 100/58, pulse 65, temperature 98.8 F (37.1 C), resp. rate (!) 8, height 5' 9 (1.753 m), weight 78.9 kg, SpO2 100%. Physical Exam Constitutional:      General: He is not in acute distress.    Appearance: He is not ill-appearing, toxic-appearing or diaphoretic.  HENT:     Head: Normocephalic and atraumatic.      Mouth/Throat:     Mouth: Mucous membranes are dry.  Eyes:     Extraocular Movements: Extraocular movements intact.     Pupils: Pupils are equal, round, and reactive to light.  Cardiovascular:     Rate and Rhythm: Normal rate and regular rhythm.     Pulses: Normal pulses.  Pulmonary:     Effort: Pulmonary effort is normal.     Breath sounds: Normal breath sounds.  Abdominal:     General: There is no distension.     Palpations: Abdomen is soft. There is no mass.  Tenderness: There is no abdominal tenderness. There is no guarding.     Hernia: No hernia is present.  Musculoskeletal:        General: Normal range of motion.     Cervical back: Normal range of motion and neck supple.  Skin:    General: Skin is warm and dry.  Neurological:     General: No focal deficit present.     Mental Status: He is alert and oriented to person, place, and time.   Assessment/Plan: 1) Melenic stools for 6 days with anemia/nausea and vomiting this morning with gastri ulcers and esophageal varices noted on a recent EGD along with portal gastropathy and duodenitis-plans are to do EGD on 01/26/2024-serial CBCs IV PPIs and a clear liquid diet for now. Liquid diet for now. 2) End-stage renal disease on dialysis 3 times a week Monday-Wednesday-Friday. 3) Hypertension/hyperlipidemia 4) CAD status post CABG 2018 5) AODM. 6) Chronic back pain. Renaye Sous 01/16/2024, 3:15 PM

## 2024-01-16 NOTE — H&P (View-Only) (Signed)
 Reason for Consult: Melena with severe anemia . Referring Physician: [facility] hospitalist  Jon Anderson is an 73 y.o. male.  HPI: Jon Anderson is a 73 year old black male with multiple medical problems listed below, presented to the emergency room with a 6-day history of melenic stools generalized weakness and shortness of breath with exertion. He has history of end-stage renal disease and is on hemodialysis M-W-F; he completed his dialysis session today and went home and felt significantly weak which prompted him to come to the emergency room he has chronic back pain but since last Friday he has been having black stools he denies having any bright red bleeding per rectum. There is no history of nausea but he did vomit clear liquids once today. He has a history of chronic constipation where he can go 3 to 4 days without having a BM.  His CBC on admission revealed a hemoglobin to be at 7 g/dL from baseline of 10 g/dL. Stools were FOBT positive. He has a longstanding history of acid reflux and also has a known hiatal hernia. He has had an EGD done on 09/13/23 when he was noted to have Grade II esophageal along with non-bleeding gastric ulcers, portal gastropathy and duodenitis; he had a normal colonoscopy done on the same day as well. He denies the use of NSAIDS and is on Plavix .  Past Medical History:  Diagnosis Date   Anemia of chronic disease    Arthritis    CAD (coronary artery disease)    Carotid stenosis    a. <50% RICA, >70% LICA by duplex 07/2015   Chronic chest pain    occ   Constipation    chronic   Dyspnea    occasional with extertion   ESRD (end stage renal disease) on dialysis (HCC)    M-W-F- [facility]   GERD (gastroesophageal reflux disease)    History of hiatal hernia    History of kidney stones    Passed   HNP (herniated nucleus pulposus), lumbar    HTN (hypertension)    echo 3/10: EF 60%, LAE   Hyperlipidemia    Mitral stenosis    mild MS 11/15/21   Nephrolithiasis     passed them all   PEA (Pulseless electrical activity) (HCC) 11/15/2021   PEA arrest felt likely related to contrast anaphylaxis despite premedication   Peripheral arterial disease    a. s/p PTCA  right    Pneumonia yrs ago   Restless legs    Sciatic leg pain    Sleep apnea    does not use cpap   Snores    a. presumed OSA, pt has refused sleep eval in past.   Type II diabetes mellitus (HCC)    no longer on medications, checks blood glucose at home   Urinary frequency    Uses wheelchair    Walker as ambulation aid    Past Surgical History:  Procedure Laterality Date   A/V FISTULAGRAM Left 01/10/2021   Procedure: A/V FISTULAGRAM;  Surgeon: Sheree Penne Bruckner, MD;  Location: [facility] INVASIVE CV LAB;  Service: Cardiovascular;  Laterality: Left;   A/V FISTULAGRAM Left 05/19/2021   Procedure: A/V Fistulagram;  Surgeon: Gretta Bruckner PARAS, MD;  Location: [facility] INVASIVE CV LAB;  Service: Cardiovascular;  Laterality: Left;   ABDOMINAL AORTOGRAM W/LOWER EXTREMITY Bilateral 08/21/2019   Procedure: ABDOMINAL AORTOGRAM W/LOWER EXTREMITY;  Surgeon: Sheree Penne Bruckner, MD;  Location: [facility] INVASIVE CV LAB;  Service: Cardiovascular;  Laterality: Bilateral;   AMPUTATION Right 07/25/2019  Procedure: RIGHT 5th RAY AMPUTATION;  Surgeon: Harden Jerona GAILS, MD;  Location: [facility] OR;  Service: Orthopedics;  Laterality: Right;   AMPUTATION Right 11/19/2019   Procedure: RIGHT TRANSMETATARSAL AMPUTATION;  Surgeon: Harden Jerona GAILS, MD;  Location: [facility] OR;  Service: Orthopedics;  Laterality: Right;   ANGIOPLASTY / STENTING FEMORAL Left 12/11/2013   dr berry   AV FISTULA PLACEMENT Left 03/19/2014   Procedure: CREATION OF ARTERIOVENOUS (AV) FISTULA  LEFT UPPER ARM;  Surgeon: Lynwood JONETTA Collum, MD;  Location: [facility] OR;  Service: Vascular;  Laterality: Left;   AV FISTULA PLACEMENT Left 11/09/2020   Procedure: LEFT ARM ARTERIOVENOUS (AV) FISTULA CREATION;  Surgeon: Sheree Penne Bruckner, MD;  Location: [facility] OR;  Service:  Vascular;  Laterality: Left;   AV FISTULA PLACEMENT Left 02/15/2021   Procedure: INSERTION OF LEFT ARM ARTERIOVENOUS GORE-TEX GRAFT;  Surgeon: Sheree Penne Bruckner, MD;  Location: [facility] OR;  Service: Vascular;  Laterality: Left;   AV FISTULA PLACEMENT Right 02/16/2022   Procedure: RIGHT ARM BRACHIOCEPAHLIC ARTERIOVENOUS (AV) FISTULA CREATION;  Surgeon: Sheree Penne Bruckner, MD;  Location: [facility] OR;  Service: Vascular;  Laterality: Right;  Regional and MAC   AV FISTULA PLACEMENT Right 06/19/2023   Procedure: INSERTION OF ARTERIOVENOUS (AV) GORE-TEX GRAFT RIGHT ARM;  Surgeon: Pearline Norman RAMAN, MD;  Location: [facility] OR;  Service: Vascular;  Laterality: Right;   BACK SURGERY  01/2018   screws placed    BASCILIC VEIN TRANSPOSITION Left 12/14/2020   Procedure: REVISION OF LEFT ARM SECOND STAGE BASILIC VEIN TRANSPOSITION;  Surgeon: Sheree Penne Bruckner, MD;  Location: [facility] OR;  Service: Vascular;  Laterality: Left;   BASCILIC VEIN TRANSPOSITION Right 04/18/2022   Procedure: RIGHT BRACHIOBASILIC FISTULA SECOND STAGE TRANSPOSITION;  Surgeon: Sheree Penne Bruckner, MD;  Location: [facility] OR;  Service: Vascular;  Laterality: Right;   CARDIAC CATHETERIZATION  2001 and 2010    COLONOSCOPY W/ BIOPSIES AND POLYPECTOMY     COLONOSCOPY WITH PROPOFOL  N/A 08/01/2016   Procedure: COLONOSCOPY WITH PROPOFOL ;  Surgeon: Belvie Just, MD;  Location: [facility]COPY;  Service: Endoscopy;  Laterality: N/A;   COLONOSCOPY WITH PROPOFOL  N/A 01/17/2022   Procedure: COLONOSCOPY WITH PROPOFOL ;  Surgeon: Just Belvie, MD;  Location: [facility];  Service: Gastroenterology;  Laterality: N/A;   CORONARY ARTERY BYPASS GRAFT  2019   [facility] x [facility] bypass   ESOPHAGOGASTRODUODENOSCOPY (EGD) WITH PROPOFOL  N/A 08/01/2016   Procedure: ESOPHAGOGASTRODUODENOSCOPY (EGD) WITH PROPOFOL ;  Surgeon: Belvie Just, MD;  Location: [facility];  Service: Endoscopy;  Laterality: N/A;   FISTULA SUPERFICIALIZATION Left 02/10/2020   Procedure: LEFT  ARTERIOVENOUS FISTULA PLICATION OF DISTAL ANEURYSM;  Surgeon: Oris Krystal FALCON, MD;  Location: [facility] OR;  Service: Vascular;  Laterality: Left;   FISTULA SUPERFICIALIZATION Left 03/23/2020   Procedure: LEFT UPPER EXTREMITY ARTERIOVENOUS FISTULA PLICATION;  Surgeon: Oris Krystal FALCON, MD;  Location: [facility] OR;  Service: Vascular;  Laterality: Left;   FOOT FRACTURE SURGERY Right    ligament repair   FRACTURE SURGERY     left forearm   GRAFT APPLICATION Right 05/13/2019   Procedure: FAT GRAFT APPLICATION;  Surgeon: Gretel Ozell PARAS, DPM;  Location: [facility];  Service: Podiatry;  Laterality: Right;   HEMATOMA EVACUATION Left 11/09/2020   Procedure: EVACUATION HEMATOMA LEFT ARM;  Surgeon: Eliza Bruckner RAMAN, MD;  Location: [facility] OR;  Service: Vascular;  Laterality: Left;   INGUINAL HERNIA REPAIR Left    IR AV DIALY SHUNT INTRO NEEDLE/INTRACATH INITIAL W/PTA/IMG LEFT  11/15/2021   IR AV DIALY SHUNT INTRO NEEDLE/INTRACATH  INITIAL W/PTA/IMG RIGHT Right 08/15/2022   IR FLUORO GUIDE CV LINE LEFT  01/26/2023   IR FLUORO GUIDE CV LINE RIGHT  01/20/2022   IR FLUORO GUIDE CV LINE RIGHT  05/03/2022   IR REMOVAL TUN CV CATH W/O FL  06/16/2021   IR REMOVAL TUN CV CATH W/O FL  07/06/2022   IR US  GUIDE VASC ACCESS LEFT  11/15/2021   IR US  GUIDE VASC ACCESS LEFT  11/15/2021   IR US  GUIDE VASC ACCESS LEFT  01/26/2023   IR US  GUIDE VASC ACCESS RIGHT  11/15/2021   IR US  GUIDE VASC ACCESS RIGHT  01/20/2022   IR US  GUIDE VASC ACCESS RIGHT  08/15/2022   LEFT HEART CATH AND CORS/GRAFTS ANGIOGRAPHY N/A 04/27/2017   Procedure: LEFT HEART CATH AND CORS/GRAFTS ANGIOGRAPHY;  Surgeon: Anner Alm ORN, MD;  Location: [facility] INVASIVE CV LAB;  Service: Cardiovascular;  Laterality: N/A;   LEFT HEART CATHETERIZATION WITH CORONARY ANGIOGRAM N/A 06/22/2014   Procedure: LEFT HEART CATHETERIZATION WITH CORONARY ANGIOGRAM;  Surgeon: Debby DELENA Sor, MD;  Location: [facility] CATH LAB;  Service: Cardiovascular;  Laterality: N/A;   LOWER EXTREMITY ANGIOGRAM  Left 12/11/2013   Procedure: LOWER EXTREMITY ANGIOGRAM;  Surgeon: Dorn JINNY Lesches, MD;  Location: [facility] CATH LAB;  Service: Cardiovascular;  Laterality: Left;   LUMBAR LAMINECTOMY/DECOMPRESSION MICRODISCECTOMY Right 07/03/2017   Procedure: MICRODISCECTOMY LUMBAR FIVE - SACRAL ONE RIGHT;  Surgeon: Lanis Pupa, MD;  Location: [facility] OR;  Service: Neurosurgery;  Laterality: Right;   LUMBAR LAMINECTOMY/DECOMPRESSION MICRODISCECTOMY Right 10/19/2017   Procedure: MICRODISCECTOMY LUMBAR FIVE- SACRAL 1 ONE ;  Surgeon: Lanis Pupa, MD;  Location: [facility] OR;  Service: Neurosurgery;  Laterality: Right;   PERIPHERAL VASCULAR BALLOON ANGIOPLASTY Left 05/19/2021   Procedure: PERIPHERAL VASCULAR BALLOON ANGIOPLASTY;  Surgeon: Gretta Lonni JINNY, MD;  Location: [facility] INVASIVE CV LAB;  Service: Cardiovascular;  Laterality: Left;   PERIPHERAL VASCULAR INTERVENTION Left 01/10/2021   Procedure: PERIPHERAL VASCULAR INTERVENTION;  Surgeon: Sheree Penne Lonni, MD;  Location: [facility] INVASIVE CV LAB;  Service: Cardiovascular;  Laterality: Left;   POLYPECTOMY  01/17/2022   Procedure: POLYPECTOMY;  Surgeon: Rollin Dover, MD;  Location: [facility];  Service: Gastroenterology;;   THROMBECTOMY W/ EMBOLECTOMY Left 11/09/2020   Procedure: THROMBECTOMY OF LEFT ARTERIOVENOUS FISTULA;  Surgeon: Eliza Lonni RAMAN, MD;  Location: [facility] OR;  Service: Vascular;  Laterality: Left;   TONSILLECTOMY AND ADENOIDECTOMY     WISDOM TOOTH EXTRACTION     WOUND DEBRIDEMENT Right 05/13/2019   Procedure: DEBRIDEMENT WOUND;  Surgeon: Gretel Ozell JINNY, DPM;  Location: [facility];  Service: Podiatry;  Laterality: Right;    Family History  Problem Relation Age of Onset   Heart attack Sister        died @ 46   Cancer Mother        died @ 19; unknown type   Diabetes Brother        deceased   Cirrhosis Father        alcohol related   Diabetes Father    Esophageal cancer Neg Hx    Colon cancer Neg Hx    Pancreatic cancer Neg  Hx    Stomach cancer Neg Hx     Social History:  reports that he quit smoking about 47 years ago. His smoking use included cigarettes. He started smoking about 49 years ago. He has a 2 pack-year smoking history. He has never been exposed to tobacco smoke. He has never used smokeless tobacco. He reports that he does not currently  use alcohol. He reports that he does not currently use drugs after having used the following drugs: Marijuana.  Allergies:  Allergies  Allergen Reactions   Contrast Media [Iodinated Contrast Media] Anaphylaxis    Cardiac arrest, Throat swelling, hives, SOB   Kiwi Extract Itching, Swelling and Other (See Comments)    Lips and face swell- breathing not affected   Flexeril  [Cyclobenzaprine ]     Hands become flimsy, can not hold things   Tape Other (See Comments)    Plastic tape causes blisters!!   Medications: I have reviewed the patient's current medications. Prior to Admission: (Not in a hospital admission)  Scheduled:  sodium chloride    Intravenous Once   pantoprazole  (PROTONIX ) IV  40 mg Intravenous Once   Continuous: PRN:acetaminophen  **OR** acetaminophen , HYDROmorphone   Results for orders placed or performed during the hospital encounter of 01/16/24 (from the past 48 hours)  CBC with Differential/Platelet     Status: Abnormal   Collection Time: 01/16/24 12:30 PM  Result Value Ref Range   WBC 5.5 4.0 - 10.5 K/uL   RBC 2.35 (L) 4.22 - 5.81 MIL/uL   Hemoglobin 7.0 (L) 13.0 - 17.0 g/dL    Comment: REPEATED TO VERIFY   HCT 22.4 (L) 39.0 - 52.0 %   MCV 95.3 80.0 - 100.0 fL   MCH 29.8 26.0 - 34.0 pg   MCHC 31.3 30.0 - 36.0 g/dL   RDW 85.7 88.4 - 84.4 %   Platelets 168 150 - 400 K/uL   nRBC 0.0 0.0 - 0.2 %   Neutrophils Relative % 70 %   Neutro Abs 3.7 1.7 - 7.7 K/uL   Lymphocytes Relative 18 %   Lymphs Abs 1.0 0.7 - 4.0 K/uL   Monocytes Relative 9 %   Monocytes Absolute 0.5 0.1 - 1.0 K/uL   Eosinophils Relative 3 %   Eosinophils Absolute 0.2 0.0  - 0.5 K/uL   Basophils Relative 0 %   Basophils Absolute 0.0 0.0 - 0.1 K/uL   Immature Granulocytes 0 %   Abs Immature Granulocytes 0.02 0.00 - 0.07 K/uL    Comment: Performed at [facility], [address] [place], [place] [address]  Comprehensive metabolic panel with GFR     Status: Abnormal   Collection Time: 01/16/24 12:30 PM  Result Value Ref Range   Sodium 137 135 - 145 mmol/L   Potassium 3.4 (L) 3.5 - 5.1 mmol/L   Chloride 94 (L) 98 - 111 mmol/L   CO2 30 22 - 32 mmol/L   Glucose, Bld 139 (H) 70 - 99 mg/dL    Comment: Glucose reference range applies only to samples taken after fasting for at least 8 hours.   BUN 32 (H) 8 - 23 mg/dL   Creatinine, Ser 4.92 (H) 0.61 - 1.24 mg/dL   Calcium  8.7 (L) 8.9 - 10.3 mg/dL   Total Protein 5.6 (L) 6.5 - 8.1 g/dL   Albumin  2.7 (L) 3.5 - 5.0 g/dL   AST 12 (L) 15 - 41 U/L   ALT 9 0 - 44 U/L   Alkaline Phosphatase 52 38 - 126 U/L   Total Bilirubin 0.3 0.0 - 1.2 mg/dL   GFR, Estimated 11 (L) >60 mL/min    Comment: (NOTE) Calculated using the CKD-EPI Creatinine Equation (2021)    Anion gap 13 5 - 15    Comment: Performed at [facility], [address], [place], [place] [address]  Troponin I (High Sensitivity)     Status: Abnormal  Collection Time: 01/16/24 12:30 PM  Result Value Ref Range   Troponin I (High Sensitivity) 31 (H) <18 ng/L    Comment: (NOTE) Elevated high sensitivity troponin I (hsTnI) values and significant  changes across serial measurements may suggest ACS but many other  chronic and acute conditions are known to elevate hsTnI results.  Refer to the Links section for chest pain algorithms and additional  guidance. Performed at [facility], [address] [place], [place] [address]   Type and screen     Status: None (Preliminary result)   Collection Time: 01/16/24  1:05 PM  Result Value Ref Range   ABO/RH(D) A POS    Antibody Screen NEG    Sample Expiration      01/19/2024,2359 Performed at  [facility], [address] [place], [place] [address]    Unit Number [id number]    Blood Component Type RED CELLS,LR    Unit division 00    Status of Unit ALLOCATED    Transfusion Status OK TO TRANSFUSE    Crossmatch Result Compatible    Unit Number [id number]    Blood Component Type RED CELLS,LR    Unit division 00    Status of Unit ALLOCATED    Transfusion Status OK TO TRANSFUSE    Crossmatch Result Compatible   POC occult blood, ED RN will collect     Status: Abnormal   Collection Time: 01/16/24  2:35 PM  Result Value Ref Range   Fecal Occult Bld POSITIVE (A) NEGATIVE  Prepare RBC (crossmatch)     Status: None   Collection Time: 01/16/24  3:00 PM  Result Value Ref Range   Order Confirmation      ORDER PROCESSED BY BLOOD BANK Performed at [facility], [address] [place], [place] [address]    DG Chest Port 1 View Result Date: 01/16/2024 CLINICAL DATA:  Shortness of breath. EXAM: PORTABLE CHEST 1 VIEW COMPARISON:  10/28/2023 FINDINGS: Again noted is elevation of the right hemidiaphragm. Lungs are clear. Heart and mediastinum are within normal limits and stable. Atherosclerotic calcifications at the aortic arch. Negative for a pneumothorax. No acute bone abnormality. IMPRESSION: 1. No acute cardiopulmonary disease. 2. Chronic elevation of the right hemidiaphragm. Electronically Signed   By: Juliene Balder M.D.   On: 01/16/2024 13:05   Review of Systems  Constitutional:  Positive for activity change and fatigue. Negative for appetite change, chills, diaphoresis, fever and unexpected weight change.  HENT: Negative.     Blood pressure (!) 100/58, pulse 65, temperature 98.8 F (37.1 C), resp. rate (!) 8, height 5' 9 (1.753 m), weight 78.9 kg, SpO2 100%. Physical Exam Constitutional:      General: He is not in acute distress.    Appearance: He is not ill-appearing, toxic-appearing or diaphoretic.  HENT:     Head: Normocephalic and atraumatic.      Mouth/Throat:     Mouth: Mucous membranes are dry.  Eyes:     Extraocular Movements: Extraocular movements intact.     Pupils: Pupils are equal, round, and reactive to light.  Cardiovascular:     Rate and Rhythm: Normal rate and regular rhythm.     Pulses: Normal pulses.  Pulmonary:     Effort: Pulmonary effort is normal.     Breath sounds: Normal breath sounds.  Abdominal:     General: There is no distension.     Palpations: Abdomen is soft. There is no mass.  Tenderness: There is no abdominal tenderness. There is no guarding.     Hernia: No hernia is present.  Musculoskeletal:        General: Normal range of motion.     Cervical back: Normal range of motion and neck supple.  Skin:    General: Skin is warm and dry.  Neurological:     General: No focal deficit present.     Mental Status: He is alert and oriented to person, place, and time.   Assessment/Plan: 1) Melenic stools for 6 days with anemia/nausea and vomiting this morning with gastri ulcers and esophageal varices noted on a recent EGD along with portal gastropathy and duodenitis-plans are to do EGD on 01/26/2024-serial CBCs IV PPIs and a clear liquid diet for now. Liquid diet for now. 2) End-stage renal disease on dialysis 3 times a week Monday-Wednesday-Friday. 3) Hypertension/hyperlipidemia 4) CAD status post CABG 2018 5) AODM. 6) Chronic back pain. Renaye Sous 01/16/2024, 3:15 PM

## 2024-01-16 NOTE — ED Provider Notes (Addendum)
 [place] EMERGENCY DEPARTMENT AT [name] Provider Note   CSN: [id number] Arrival date & time: 01/16/24  1159     Patient presents with: No chief complaint on file.   Jon Anderson is a 73 y.o. male.   73year-old male with history of ERSD presents with worsening shortness of breath since this morning.  Patient was last dialyzed today and went for his full session.  States when he got home he became more short of breath and weak.  Had some right sided sharp chest pain.  States he felt short of breath prior to dialysis and that that did not improve it.  Went for his regular session 2 days ago.  No recent fever or cough.  No anginal type symptoms.  Possible dark stools according to EMS.  He denies any abdominal pain at this time.  Does have chronic back pain which is unchanged from prior.  Patient is on Plavix        Prior to Admission medications   Medication Sig Start Date End Date Taking? Authorizing Provider  aspirin  EC 81 MG tablet Take 81 mg by mouth daily. Swallow whole.    [provider]  calcium  acetate (PHOSLO ) 667 MG capsule Take 2 capsules (1,334 mg total) by mouth 2 (two) times daily with a meal. 12/01/19   Angiulli, Toribio PARAS, PA-C  clopidogrel  (PLAVIX ) 75 MG tablet Take 75 mg by mouth in the morning. 12/01/19   [provider]  gabapentin  (NEURONTIN ) 100 MG capsule Take 100-200 mg by mouth See admin instructions. 100 mg in the morning, 200 mg in the evening 11/28/19   [provider]  multivitamin (RENA-VIT) TABS tablet Take 1 tablet by mouth at bedtime. 12/01/19   Angiulli, Toribio PARAS, PA-C  oxyCODONE -acetaminophen  (PERCOCET) 5-325 MG tablet Take 1 tablet by mouth every 6 (six) hours as needed. Patient not taking: Reported on 07/17/2023 06/19/23   Rhyne, Samantha J, PA-C  pantoprazole  (PROTONIX ) 40 MG tablet Take 40 mg by mouth daily before breakfast.    [provider]    Allergies: Contrast media [iodinated contrast media],  Kiwi extract, Flexeril  [cyclobenzaprine ], and Tape    Review of Systems  All other systems reviewed and are negative.   Updated Vital Signs There were no vitals taken for this visit.  Physical Exam Vitals and nursing note reviewed. Exam conducted with a chaperone present.  Constitutional:      General: He is not in acute distress.    Appearance: Normal appearance. He is well-developed. He is not toxic-appearing.  HENT:     Head: Normocephalic and atraumatic.  Eyes:     General: Lids are normal.     Conjunctiva/sclera: Conjunctivae normal.     Pupils: Pupils are equal, round, and reactive to light.  Neck:     Thyroid: No thyroid mass.     Trachea: No tracheal deviation.  Cardiovascular:     Rate and Rhythm: Normal rate and regular rhythm.     Heart sounds: Normal heart sounds. No murmur heard.    No gallop.  Pulmonary:     Effort: Pulmonary effort is normal. No respiratory distress.     Breath sounds: No stridor. Decreased breath sounds present. No wheezing, rhonchi or rales.  Abdominal:     General: There is no distension.     Palpations: Abdomen is soft.     Tenderness: There is no abdominal tenderness. There is no rebound.  Genitourinary:    Comments: Black stool noted on DRE  Musculoskeletal:        General: No tenderness. Normal range of motion.     Cervical back: Normal range of motion and neck supple.  Skin:    General: Skin is warm and dry.     Findings: No abrasion or rash.  Neurological:     Mental Status: He is alert and oriented to person, place, and time. Mental status is at baseline.     GCS: GCS eye subscore is 4. GCS verbal subscore is 5. GCS motor subscore is 6.     Cranial Nerves: No cranial nerve deficit.     Sensory: No sensory deficit.     Motor: Motor function is intact.  Psychiatric:        Attention and Perception: Attention normal.        Speech: Speech normal.        Behavior: Behavior normal.     (all labs ordered are listed, but only  abnormal results are displayed) Labs Reviewed  CBC WITH DIFFERENTIAL/PLATELET  COMPREHENSIVE METABOLIC PANEL WITH GFR  TROPONIN I (HIGH SENSITIVITY)    EKG: EKG Interpretation Date/Time:  Wednesday January 16 2024 12:11:01 EDT Ventricular Rate:  87 PR Interval:  162 QRS Duration:  93 QT Interval:  383 QTC Calculation: 461 R Axis:   57  Text Interpretation: Sinus rhythm Multiform ventricular premature complexes Anterior infarct, old Confirmed by Dasie Faden ([id number]) on 01/16/2024 2:37:48 PM  Radiology: No results found.   Procedures   Medications Ordered in the ED - No data to display                                  Medical Decision Making Amount and/or Complexity of Data Reviewed Labs: ordered. Radiology: ordered. ECG/medicine tests: ordered.  Risk Prescription drug management.  Patient is EKG shows sinus rhythm.  Chest x-ray without acute findings here.  No evidence of fluid overload. Patient with guaiac positive stools here.  Hemoglobin low at 7.  Suspect that this is the etiology of his shortness of breath.  2 units of packed red blood cells ordered.  Will send secure chat to Dr. Dewey.  Suspect upper GI source and patient will be started on Protonix .  Will consult hospitalist team for admission.  Family at bedside and informed of patient's condition  CRITICAL CARE Performed by: Faden ONEIDA Dasie Total critical care time: 45 minutes Critical care time was exclusive of separately billable procedures and treating other patients. Critical care was necessary to treat or prevent imminent or life-threatening deterioration. Critical care was time spent personally by me on the following activities: development of treatment plan with patient and/or surrogate as well as nursing, discussions with consultants, evaluation of patient's response to treatment, examination of patient, obtaining history from patient or surrogate, ordering and performing treatments and  interventions, ordering and review of laboratory studies, ordering and review of radiographic studies, pulse oximetry and re-evaluation of patient's condition.      Final diagnoses:  None    ED Discharge Orders     None          Dasie Faden, MD 01/16/24 1438    Dasie Faden, MD 01/16/24 1440

## 2024-01-16 NOTE — ED Notes (Signed)
 Phlebotomy asked to stick

## 2024-01-16 NOTE — H&P (Addendum)
 History and Physical    Jon Anderson FMW:[id number] DOB: [birth year] DOA: 01/16/2024  DOS: the patient was seen and examined on 01/16/2024  PCP: Pcp, No   Patient coming from: Home  I have personally briefly reviewed patient's old medical records in [facility] and CareEverywhere  HPI:   Jon Anderson is a 73 y.o. year old male with past medical history of hyperlipidemia complicated by CAD status post CABG in 2018, diabetes, ESRD on hemodialysis on MWF presenting with worsening dyspnea and fatigue that started after his dialysis session today.  He completed his dialysis without any interruptions.  Patient reports he was having back pain that is chronic for him. Pt reports dark stools since Friday. Describes stools as black and states normally they are brown. States he feels bloated but that is normal for him.   States he has been having nausea and he vomited today. He was having chest pain that started after vomiting and was right sided. It has resolved now.   ED Course: Patient presented to the ED and was found to be borderline hypotensive.  CBC was obtained that showed hemoglobin of 7 baseline appears to be around 9-10.  Given patient concern for dark stools Hemoccult was done that was positive.  Chest x-ray was performed that did not show any acute findings.  GI was consulted.  TRH contacted for admission.  Review of Systems: As mentioned in the history of present illness. All other systems reviewed and are negative.   Past Medical History:  Diagnosis Date   Anemia of chronic disease    Arthritis    CAD (coronary artery disease)    Carotid stenosis    a. <50% RICA, >70% LICA by duplex 07/2015   Chronic chest pain    occ   Constipation    chronic   Dyspnea    occasional with extertion   ESRD (end stage renal disease) on dialysis (HCC)    M-W-F- [facility]   GERD (gastroesophageal reflux disease)    History of hiatal hernia    History of kidney stones     Passed   HNP (herniated nucleus pulposus), lumbar    HTN (hypertension)    echo 3/10: EF 60%, LAE   Hyperlipidemia    Mitral stenosis    mild MS 11/15/21   Nephrolithiasis    passed them all   PEA (Pulseless electrical activity) (HCC) 11/15/2021   PEA arrest felt likely related to contrast anaphylaxis despite premedication   Peripheral arterial disease    a. s/p PTCA  right    Pneumonia yrs ago   Restless legs    Sciatic leg pain    Sleep apnea    does not use cpap   Snores    a. presumed OSA, pt has refused sleep eval in past.   Type II diabetes mellitus (HCC)    no longer on medications, checks blood glucose at home   Urinary frequency    Uses wheelchair    Walker as ambulation aid     Past Surgical History:  Procedure Laterality Date   A/V FISTULAGRAM Left 01/10/2021   Procedure: A/V FISTULAGRAM;  Surgeon: Sheree Penne Bruckner, MD;  Location: [facility] INVASIVE CV LAB;  Service: Cardiovascular;  Laterality: Left;   A/V FISTULAGRAM Left 05/19/2021   Procedure: A/V Fistulagram;  Surgeon: Gretta Bruckner PARAS, MD;  Location: [facility] INVASIVE CV LAB;  Service: Cardiovascular;  Laterality: Left;   ABDOMINAL AORTOGRAM W/LOWER EXTREMITY Bilateral 08/21/2019   Procedure: ABDOMINAL  AORTOGRAM W/LOWER EXTREMITY;  Surgeon: Sheree Penne Bruckner, MD;  Location: [facility] INVASIVE CV LAB;  Service: Cardiovascular;  Laterality: Bilateral;   AMPUTATION Right 07/25/2019   Procedure: RIGHT 5th RAY AMPUTATION;  Surgeon: Harden Jerona GAILS, MD;  Location: [facility] OR;  Service: Orthopedics;  Laterality: Right;   AMPUTATION Right 11/19/2019   Procedure: RIGHT TRANSMETATARSAL AMPUTATION;  Surgeon: Harden Jerona GAILS, MD;  Location: [facility] OR;  Service: Orthopedics;  Laterality: Right;   ANGIOPLASTY / STENTING FEMORAL Left 12/11/2013   dr berry   AV FISTULA PLACEMENT Left 03/19/2014   Procedure: CREATION OF ARTERIOVENOUS (AV) FISTULA  LEFT UPPER ARM;  Surgeon: Lynwood JONETTA Collum, MD;  Location: [facility] OR;  Service: Vascular;   Laterality: Left;   AV FISTULA PLACEMENT Left 11/09/2020   Procedure: LEFT ARM ARTERIOVENOUS (AV) FISTULA CREATION;  Surgeon: Sheree Penne Bruckner, MD;  Location: [facility] OR;  Service: Vascular;  Laterality: Left;   AV FISTULA PLACEMENT Left 02/15/2021   Procedure: INSERTION OF LEFT ARM ARTERIOVENOUS GORE-TEX GRAFT;  Surgeon: Sheree Penne Bruckner, MD;  Location: [facility] OR;  Service: Vascular;  Laterality: Left;   AV FISTULA PLACEMENT Right 02/16/2022   Procedure: RIGHT ARM BRACHIOCEPAHLIC ARTERIOVENOUS (AV) FISTULA CREATION;  Surgeon: Sheree Penne Bruckner, MD;  Location: [facility] OR;  Service: Vascular;  Laterality: Right;  Regional and MAC   AV FISTULA PLACEMENT Right 06/19/2023   Procedure: INSERTION OF ARTERIOVENOUS (AV) GORE-TEX GRAFT RIGHT ARM;  Surgeon: Pearline Norman RAMAN, MD;  Location: [facility] OR;  Service: Vascular;  Laterality: Right;   BACK SURGERY  01/2018   screws placed    BASCILIC VEIN TRANSPOSITION Left 12/14/2020   Procedure: REVISION OF LEFT ARM SECOND STAGE BASILIC VEIN TRANSPOSITION;  Surgeon: Sheree Penne Bruckner, MD;  Location: [facility] OR;  Service: Vascular;  Laterality: Left;   BASCILIC VEIN TRANSPOSITION Right 04/18/2022   Procedure: RIGHT BRACHIOBASILIC FISTULA SECOND STAGE TRANSPOSITION;  Surgeon: Sheree Penne Bruckner, MD;  Location: [facility] OR;  Service: Vascular;  Laterality: Right;   CARDIAC CATHETERIZATION  2001 and 2010    COLONOSCOPY W/ BIOPSIES AND POLYPECTOMY     COLONOSCOPY WITH PROPOFOL  N/A 08/01/2016   Procedure: COLONOSCOPY WITH PROPOFOL ;  Surgeon: Belvie Just, MD;  Location: [facility] ENDOSCOPY;  Service: Endoscopy;  Laterality: N/A;   COLONOSCOPY WITH PROPOFOL  N/A 01/17/2022   Procedure: COLONOSCOPY WITH PROPOFOL ;  Surgeon: Just Belvie, MD;  Location: [facility]COPY;  Service: Gastroenterology;  Laterality: N/A;   CORONARY ARTERY BYPASS GRAFT  2019   [facility] x 1 bypass   ESOPHAGOGASTRODUODENOSCOPY (EGD) WITH PROPOFOL  N/A 08/01/2016   Procedure: ESOPHAGOGASTRODUODENOSCOPY  (EGD) WITH PROPOFOL ;  Surgeon: Belvie Just, MD;  Location: [facility]COPY;  Service: Endoscopy;  Laterality: N/A;   FISTULA SUPERFICIALIZATION Left 02/10/2020   Procedure: 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;  Surgeon: Oris Krystal FALCON, MD;  Location: [facility] OR;  Service: Vascular;  Laterality: Left;   FISTULA SUPERFICIALIZATION Left 03/23/2020   Procedure: LEFT UPPER EXTREMITY ARTERIOVENOUS FISTULA PLICATION;  Surgeon: Oris Krystal FALCON, MD;  Location: [facility] OR;  Service: Vascular;  Laterality: Left;   FOOT FRACTURE SURGERY Right    ligament repair   FRACTURE SURGERY     left forearm   GRAFT APPLICATION Right 05/13/2019   Procedure: FAT GRAFT APPLICATION;  Surgeon: Gretel Ozell PARAS, DPM;  Location: [facility];  Service: Podiatry;  Laterality: Right;   HEMATOMA EVACUATION Left 11/09/2020   Procedure: EVACUATION HEMATOMA LEFT ARM;  Surgeon: Eliza Bruckner RAMAN, MD;  Location: [facility] OR;  Service: Vascular;  Laterality: Left;  INGUINAL HERNIA REPAIR Left    IR AV DIALY SHUNT INTRO NEEDLE/INTRACATH INITIAL W/PTA/IMG LEFT  11/15/2021   IR AV DIALY SHUNT INTRO NEEDLE/INTRACATH INITIAL W/PTA/IMG RIGHT Right 08/15/2022   IR FLUORO GUIDE CV LINE LEFT  01/26/2023   IR FLUORO GUIDE CV LINE RIGHT  01/20/2022   IR FLUORO GUIDE CV LINE RIGHT  05/03/2022   IR REMOVAL TUN CV CATH W/O FL  06/16/2021   IR REMOVAL TUN CV CATH W/O FL  07/06/2022   IR US  GUIDE VASC ACCESS LEFT  11/15/2021   IR US  GUIDE VASC ACCESS LEFT  11/15/2021   IR US  GUIDE VASC ACCESS LEFT  01/26/2023   IR US  GUIDE VASC ACCESS RIGHT  11/15/2021   IR US  GUIDE VASC ACCESS RIGHT  01/20/2022   IR US  GUIDE VASC ACCESS RIGHT  08/15/2022   LEFT HEART CATH AND CORS/GRAFTS ANGIOGRAPHY N/A 04/27/2017   Procedure: LEFT HEART CATH AND CORS/GRAFTS ANGIOGRAPHY;  Surgeon: Anner Alm ORN, MD;  Location: [facility] INVASIVE CV LAB;  Service: Cardiovascular;  Laterality: N/A;   LEFT HEART CATHETERIZATION WITH CORONARY ANGIOGRAM N/A 06/22/2014   Procedure: LEFT  HEART CATHETERIZATION WITH CORONARY ANGIOGRAM;  Surgeon: Debby DELENA Sor, MD;  Location: [facility] CATH LAB;  Service: Cardiovascular;  Laterality: N/A;   LOWER EXTREMITY ANGIOGRAM Left 12/11/2013   Procedure: LOWER EXTREMITY ANGIOGRAM;  Surgeon: Dorn JINNY Lesches, MD;  Location: [facility] CATH LAB;  Service: Cardiovascular;  Laterality: Left;   LUMBAR LAMINECTOMY/DECOMPRESSION MICRODISCECTOMY Right 07/03/2017   Procedure: MICRODISCECTOMY LUMBAR FIVE - SACRAL ONE RIGHT;  Surgeon: Lanis Pupa, MD;  Location: [facility] OR;  Service: Neurosurgery;  Laterality: Right;   LUMBAR LAMINECTOMY/DECOMPRESSION MICRODISCECTOMY Right 10/19/2017   Procedure: MICRODISCECTOMY LUMBAR FIVE- SACRAL 1 ONE ;  Surgeon: Lanis Pupa, MD;  Location: [facility] OR;  Service: Neurosurgery;  Laterality: Right;   PERIPHERAL VASCULAR BALLOON ANGIOPLASTY Left 05/19/2021   Procedure: PERIPHERAL VASCULAR BALLOON ANGIOPLASTY;  Surgeon: Gretta Lonni JINNY, MD;  Location: [facility] INVASIVE CV LAB;  Service: Cardiovascular;  Laterality: Left;   PERIPHERAL VASCULAR INTERVENTION Left 01/10/2021   Procedure: PERIPHERAL VASCULAR INTERVENTION;  Surgeon: Sheree Penne Lonni, MD;  Location: [facility] INVASIVE CV LAB;  Service: Cardiovascular;  Laterality: Left;   POLYPECTOMY  01/17/2022   Procedure: POLYPECTOMY;  Surgeon: Rollin Dover, MD;  Location: [facility];  Service: Gastroenterology;;   THROMBECTOMY W/ EMBOLECTOMY Left 11/09/2020   Procedure: THROMBECTOMY OF LEFT ARTERIOVENOUS FISTULA;  Surgeon: Eliza Lonni RAMAN, MD;  Location: [facility] OR;  Service: Vascular;  Laterality: Left;   TONSILLECTOMY AND ADENOIDECTOMY     WISDOM TOOTH EXTRACTION     WOUND DEBRIDEMENT Right 05/13/2019   Procedure: DEBRIDEMENT WOUND;  Surgeon: Gretel Ozell JINNY, DPM;  Location: [facility];  Service: Podiatry;  Laterality: Right;     Allergies  Allergen Reactions   Contrast Media [Iodinated Contrast Media] Anaphylaxis    Cardiac arrest, Throat swelling, hives, SOB    Kiwi Extract Itching, Swelling and Other (See Comments)    Lips and face swell- breathing not affected   Flexeril  [Cyclobenzaprine ]     Hands become flimsy, can not hold things   Tape Other (See Comments)    Plastic tape causes blisters!!    Family History  Problem Relation Age of Onset   Heart attack Sister        died @ 68   Cancer Mother        died @ 65; unknown type   Diabetes Brother        deceased  Cirrhosis Father        alcohol related   Diabetes Father    Esophageal cancer Neg Hx    Colon cancer Neg Hx    Pancreatic cancer Neg Hx    Stomach cancer Neg Hx     Prior to Admission medications   Medication Sig Start Date End Date Taking? Authorizing Provider  aspirin  EC 81 MG tablet Take 81 mg by mouth daily. Swallow whole.    [provider]  calcium  acetate (PHOSLO ) 667 MG capsule Take 2 capsules (1,334 mg total) by mouth 2 (two) times daily with a meal. 12/01/19   Angiulli, Toribio PARAS, PA-C  clopidogrel  (PLAVIX ) 75 MG tablet Take 75 mg by mouth in the morning. 12/01/19   [provider]  gabapentin  (NEURONTIN ) 100 MG capsule Take 100-200 mg by mouth See admin instructions. 100 mg in the morning, 200 mg in the evening 11/28/19   [provider]  multivitamin (RENA-VIT) TABS tablet Take 1 tablet by mouth at bedtime. 12/01/19   Angiulli, Toribio PARAS, PA-C  oxyCODONE -acetaminophen  (PERCOCET) 5-325 MG tablet Take 1 tablet by mouth every 6 (six) hours as needed. Patient not taking: Reported on 07/17/2023 06/19/23   Rhyne, Samantha J, PA-C  pantoprazole  (PROTONIX ) 40 MG tablet Take 40 mg by mouth daily before breakfast.    [provider]      reports that he quit smoking about 47 years ago. His smoking use included cigarettes. He started smoking about 49 years ago. He has a 2 pack-year smoking history. He has never been exposed to tobacco smoke. He has never used smokeless tobacco. He reports that he does not currently use alcohol. He reports that  he does not currently use drugs after having used the following drugs: Marijuana. Lives with wife and daughter.  Currently works at/ unemployed currently Tobacco- Denies use. States smoked over 50 years ago but only 1.5 pack year.  EtOH- Denies use. States drank over 40 years ago.  Illicit drug use- denies use.  IADLs/ADLs- can person independently at baseline    Physical Exam: Vitals:   01/16/24 1848 01/16/24 2011 01/16/24 2119 01/16/24 2140  BP: 117/62 103/61 111/61 112/61  Pulse: 76 74 73 72  Resp:  18 18 18   Temp:  98 F (36.7 C) 97.8 F (36.6 C) 98.1 F (36.7 C)  TempSrc:  Oral Oral Oral  SpO2: 100% 100% 100% 100%  Weight:      Height:         Gen: No acute distress HENT: NCAT CV: RRR, good pulses in upper extremities, diminished pedal pulses Resp: CTAB Abd: Distended abdomen, no TTP, diminished bowel sounds MSK: Right transmetatarsal amputation Skin: No lesions on examined skin Neuro: Alert and oriented x 4 Psych: Good mood   Labs on Admission: I have personally reviewed following labs and imaging studies  CBC: Recent Labs  Lab 01/16/24 1230  WBC 5.5  NEUTROABS 3.7  HGB 7.0*  HCT 22.4*  MCV 95.3  PLT 168   Basic Metabolic Panel: Recent Labs  Lab 01/16/24 1230  NA 137  K 3.4*  CL 94*  CO2 30  GLUCOSE 139*  BUN 32*  CREATININE 5.07*  CALCIUM  8.7*   GFR: Estimated Creatinine Clearance: 12.8 mL/min (A) (by C-G formula based on SCr of 5.07 mg/dL (H)). Liver Function Tests: Recent Labs  Lab 01/16/24 1230  AST 12*  ALT 9  ALKPHOS 52  BILITOT 0.3  PROT 5.6*  ALBUMIN  2.7*   No results for input(s):  LIPASE, AMYLASE in the last 168 hours. No results for input(s): AMMONIA in the last 168 hours. Coagulation Profile: No results for input(s): INR, PROTIME in the last 168 hours. Cardiac Enzymes: Recent Labs  Lab 01/16/24 1230 01/16/24 1536  TROPONINIHS 31* 31*   BNP (last 3 results) No results for input(s): BNP in the last 8760  hours. HbA1C: No results for input(s): HGBA1C in the last 72 hours. CBG: No results for input(s): GLUCAP in the last 168 hours. Lipid Profile: No results for input(s): CHOL, HDL, LDLCALC, TRIG, CHOLHDL, LDLDIRECT in the last 72 hours. Thyroid Function Tests: No results for input(s): TSH, T4TOTAL, FREET4, T3FREE, THYROIDAB in the last 72 hours. Anemia Panel: No results for input(s): VITAMINB12, FOLATE, FERRITIN, TIBC, IRON, RETICCTPCT in the last 72 hours. Urine analysis:    Component Value Date/Time   COLORURINE YELLOW 03/18/2017 1721   APPEARANCEUR CLEAR 03/18/2017 1721   LABSPEC 1.007 03/18/2017 1721   PHURINE 9.0 (H) 03/18/2017 1721   GLUCOSEU 150 (A) 03/18/2017 1721   HGBUR NEGATIVE 03/18/2017 1721   BILIRUBINUR NEGATIVE 03/18/2017 1721   KETONESUR NEGATIVE 03/18/2017 1721   PROTEINUR >=300 (A) 03/18/2017 1721   UROBILINOGEN 0.2 06/18/2014 2308   NITRITE NEGATIVE 03/18/2017 1721   LEUKOCYTESUR NEGATIVE 03/18/2017 1721    Radiological Exams on Admission: I have personally reviewed images DG Chest Port 1 View Result Date: 01/16/2024 CLINICAL DATA:  Shortness of breath. EXAM: PORTABLE CHEST 1 VIEW COMPARISON:  10/28/2023 FINDINGS: Again noted is elevation of the right hemidiaphragm. Lungs are clear. Heart and mediastinum are within normal limits and stable. Atherosclerotic calcifications at the aortic arch. Negative for a pneumothorax. No acute bone abnormality. IMPRESSION: 1. No acute cardiopulmonary disease. 2. Chronic elevation of the right hemidiaphragm. Electronically Signed   By: Juliene Balder M.D.   On: 01/16/2024 13:05    EKG: My personal interpretation of EKG shows: Sinus rhythm without any acute ST changes.  Unchanged from previous EKG.    Assessment/Plan Principal Problem:   Symptomatic anemia Active Problems:   Hyperlipidemia LDL goal <70   Essential hypertension   CAD (coronary artery disease) nonobstructive per cath 2012    ESRD (end stage renal disease) (HCC)   Type II diabetes mellitus (HCC)   GI bleeding   Symptomatic anemia secondary to GI bleeding Patient with hemoglobin of 7 from baseline of 9-10.  He is symptoms of dyspnea and fatigue.  Patient getting 2 unit of PRBC ordered by EDP.  GI consulted, appreciate their recommendations.  IV PPI started.  Continue twice daily.  Will place on clear liquid diet per GI recommendations.  Plan for endoscopy on 10/10.  Serial H&H every 6 hours.  Transfuse as needed for hemoglobin greater than 7.  Holding home Plavix .   Chronic problems: Hyperlipidemia: LDL 138.  Not currently on any statin.  Will discuss with patient and start on statin or defer this to outpatient. ESRD on HD.  Will need nephrology consult to restart inpatient dialysis.  No current need for dialysis.  Next session on Friday. Type 2 diabetes: Not currently on any pharmacotherapy.  Will restart home gabapentin  and monitor CBGs prior to initiation of SSI. DDD: Patient status post multiple spinal surgeries.  On chronic opioid therapy outpatient.  Will restart opioid therapy here.  Will defer initiation of bowel regimen as patient may have a GI bleed which can lead to loose stools.     VTE prophylaxis:  SCDs  Diet: N.p.o. Access: 2 PIV Code Status:  Full Code  Telemetry:  Admission status: Inpatient, Progressive bed Patient is from: Home  Anticipated d/c is to: home  Anticipated d/c is in: 2-3 days    Family Communication: Updated at bedside   Consults called: GI   Severity of Illness: The appropriate patient status for this patient is INPATIENT. Inpatient status is judged to be reasonable and necessary in order to provide the required intensity of service to ensure the patient's safety. The patient's presenting symptoms, physical exam findings, and initial radiographic and laboratory data in the context of their chronic comorbidities is felt to place them at high risk for further clinical  deterioration. Furthermore, it is not anticipated that the patient will be medically stable for discharge from the hospital within 2 midnights of admission.   * I certify that at the point of admission it is my clinical judgment that the patient will require inpatient hospital care spanning beyond 2 midnights from the point of admission due to high intensity of service, high risk for further deterioration and high frequency of surveillance required.DEWAINE Morene Bathe, MD Jolynn DEL. [facility]

## 2024-01-17 ENCOUNTER — Inpatient Hospital Stay (HOSPITAL_COMMUNITY): Admitting: Anesthesiology

## 2024-01-17 ENCOUNTER — Ambulatory Visit

## 2024-01-17 ENCOUNTER — Encounter (HOSPITAL_COMMUNITY)
Admission: EM | Disposition: A | Discharge status: Home / Self Care | Payer: Self-pay | Source: Home / Self Care | Attending: Internal Medicine

## 2024-01-17 ENCOUNTER — Inpatient Hospital Stay (HOSPITAL_COMMUNITY)

## 2024-01-17 ENCOUNTER — Encounter (HOSPITAL_COMMUNITY): Payer: Self-pay | Admitting: Internal Medicine

## 2024-01-17 DIAGNOSIS — K2970 Gastritis, unspecified, without bleeding: Secondary | ICD-10-CM

## 2024-01-17 DIAGNOSIS — D649 Anemia, unspecified: Secondary | ICD-10-CM | POA: Diagnosis not present

## 2024-01-17 DIAGNOSIS — T82898A Other specified complication of vascular prosthetic devices, implants and grafts, initial encounter: Secondary | ICD-10-CM

## 2024-01-17 DIAGNOSIS — Z87891 Personal history of nicotine dependence: Secondary | ICD-10-CM

## 2024-01-17 DIAGNOSIS — I2510 Atherosclerotic heart disease of native coronary artery without angina pectoris: Secondary | ICD-10-CM

## 2024-01-17 DIAGNOSIS — I10 Essential (primary) hypertension: Secondary | ICD-10-CM

## 2024-01-17 HISTORY — PX: ESOPHAGOGASTRODUODENOSCOPY: SHX5428

## 2024-01-17 LAB — TYPE AND SCREEN
ABO/RH(D): A POS
Antibody Screen: NEGATIVE
Unit division: 0
Unit division: 0

## 2024-01-17 LAB — CBC
HCT: 25.4 % — ABNORMAL LOW (ref 39.0–52.0)
Hemoglobin: 8.3 g/dL — ABNORMAL LOW (ref 13.0–17.0)
MCH: 29.2 pg (ref 26.0–34.0)
MCHC: 32.7 g/dL (ref 30.0–36.0)
MCV: 89.4 fL (ref 80.0–100.0)
Platelets: 143 K/uL — ABNORMAL LOW (ref 150–400)
RBC: 2.84 MIL/uL — ABNORMAL LOW (ref 4.22–5.81)
RDW: 16.2 % — ABNORMAL HIGH (ref 11.5–15.5)
WBC: 5.8 K/uL (ref 4.0–10.5)
nRBC: 0.5 % — ABNORMAL HIGH (ref 0.0–0.2)

## 2024-01-17 LAB — BASIC METABOLIC PANEL WITH GFR
Anion gap: 15 (ref 5–15)
BUN: 46 mg/dL — ABNORMAL HIGH (ref 8–23)
CO2: 26 mmol/L (ref 22–32)
Calcium: 8.6 mg/dL — ABNORMAL LOW (ref 8.9–10.3)
Chloride: 94 mmol/L — ABNORMAL LOW (ref 98–111)
Creatinine, Ser: 6.65 mg/dL — ABNORMAL HIGH (ref 0.61–1.24)
GFR, Estimated: 8 mL/min — ABNORMAL LOW (ref >60)
Glucose, Bld: 113 mg/dL — ABNORMAL HIGH (ref 70–99)
Potassium: 3.9 mmol/L (ref 3.5–5.1)
Sodium: 135 mmol/L (ref 135–145)

## 2024-01-17 LAB — IRON AND TIBC
Iron: 46 ug/dL (ref 45–182)
Saturation Ratios: 23 % (ref 17.9–39.5)
TIBC: 203 ug/dL — ABNORMAL LOW (ref 250–450)
UIBC: 157 ug/dL

## 2024-01-17 LAB — FOLATE: Folate: 11 ng/mL (ref >5.9)

## 2024-01-17 LAB — BPAM RBC
Blood Product Expiration Date: 202511012359
Blood Product Expiration Date: 202511012359
ISSUE DATE / TIME: 202510081701
ISSUE DATE / TIME: 202510082100
Unit Type and Rh: 6200
Unit Type and Rh: 6200

## 2024-01-17 LAB — VITAMIN B12: Vitamin B-12: <150 pg/mL — ABNORMAL LOW (ref 180–914)

## 2024-01-17 LAB — HEMOGLOBIN AND HEMATOCRIT, BLOOD
HCT: 21.2 % — ABNORMAL LOW (ref 39.0–52.0)
Hemoglobin: 7 g/dL — ABNORMAL LOW (ref 13.0–17.0)

## 2024-01-17 LAB — GLUCOSE, CAPILLARY: Glucose-Capillary: 138 mg/dL — ABNORMAL HIGH (ref 70–99)

## 2024-01-17 LAB — FERRITIN: Ferritin: 450 ng/mL — ABNORMAL HIGH (ref 24–336)

## 2024-01-17 SURGERY — EGD (ESOPHAGOGASTRODUODENOSCOPY)
Anesthesia: Monitor Anesthesia Care

## 2024-01-17 MED ORDER — SODIUM CHLORIDE 0.9% FLUSH: 10.0000 mL | INTRAVENOUS | Status: DC | PRN

## 2024-01-17 MED ORDER — CHLORHEXIDINE GLUCONATE CLOTH 2 % EX PADS
6.0000 | MEDICATED_PAD | Freq: Every day | CUTANEOUS | Status: DC
  Administered 2024-01-17 – 2024-01-19 (×3): 6 via TOPICAL

## 2024-01-17 MED ORDER — SODIUM CHLORIDE 0.9 % IV SOLN: INTRAVENOUS | Status: DC

## 2024-01-17 MED ORDER — SODIUM CHLORIDE 0.9% FLUSH
10.0000 mL | Freq: Two times a day (BID) | INTRAVENOUS | Status: DC
  Administered 2024-01-17 – 2024-01-19 (×6): 10 mL

## 2024-01-17 MED ORDER — DARBEPOETIN ALFA 60 MCG/0.3ML IJ SOSY
60.0000 ug | PREFILLED_SYRINGE | INTRAMUSCULAR | Status: DC
  Administered 2024-01-18: 60 ug via SUBCUTANEOUS
  Filled 2024-01-17: qty 0.3

## 2024-01-17 MED ORDER — PROPOFOL 500 MG/50ML IV EMUL
INTRAVENOUS | Status: DC | PRN
  Administered 2024-01-17: 30 mg via INTRAVENOUS
  Administered 2024-01-17: 20 mg via INTRAVENOUS
  Administered 2024-01-17: 200 ug/kg/min via INTRAVENOUS

## 2024-01-17 MED ORDER — PHENYLEPHRINE HCL (PRESSORS) 10 MG/ML IV SOLN
INTRAVENOUS | Status: DC | PRN
  Administered 2024-01-17: 120 ug via INTRAVENOUS
  Administered 2024-01-17: 160 ug via INTRAVENOUS

## 2024-01-17 MED ORDER — CYANOCOBALAMIN 1000 MCG/ML IJ SOLN
1000.0000 ug | Freq: Every day | INTRAMUSCULAR | Status: DC
Start: 2024-01-17 — End: 2024-01-19
  Administered 2024-01-17 – 2024-01-19 (×3): 1000 ug via SUBCUTANEOUS
  Filled 2024-01-17 (×3): qty 1

## 2024-01-17 NOTE — Interval H&P Note (Signed)
 History and Physical Interval Note:  01/17/2024 11:50 AM  Jon Anderson  has presented today for surgery, with the diagnosis of GI bleed.  The various methods of treatment have been discussed with the patient and family. After consideration of risks, benefits and other options for treatment, the patient has consented to  Procedure(s): EGD (ESOPHAGOGASTRODUODENOSCOPY) (N/A) as a surgical intervention.  The patient's history has been reviewed, patient examined, no change in status, stable for surgery.  I have reviewed the patient's chart and labs.  Questions were answered to the patient's satisfaction.     Emir Nack D

## 2024-01-17 NOTE — Op Note (Signed)
 [facility] Patient Name: Jon Anderson Procedure Date : 01/17/2024 MRN: [id number] Attending MD: Belvie Just , MD, [id number] Date of Birth: [birth year] CSN: [id number] Age: 73 Admit Type: Outpatient Procedure:                Upper GI endoscopy Indications:              Melena Providers:                Belvie Just, MD, Curtistine Bishop, Technician,                            Clotilda Schmitz, RN Referring MD:              Medicines:                Propofol  per Anesthesia Complications:            No immediate complications. Estimated Blood Loss:     Estimated blood loss was minimal. Procedure:                Pre-Anesthesia Assessment:                           - Prior to the procedure, a History and Physical                            was performed, and patient medications and                            allergies were reviewed. The patient's tolerance of                            previous anesthesia was also reviewed. The risks                            and benefits of the procedure and the sedation                            options and risks were discussed with the patient.                            All questions were answered, and informed consent                            was obtained. Prior Anticoagulants: The patient has                            taken Plavix  (clopidogrel ), last dose was 1 day                            prior to procedure. ASA Grade Assessment: III - A                            patient with severe systemic disease. After  reviewing the risks and benefits, the patient was                            deemed in satisfactory condition to undergo the                            procedure.                           - Sedation was administered by an anesthesia                            professional. Deep sedation was attained.                           After obtaining informed consent, the endoscope was                             passed under direct vision. Throughout the                            procedure, the patient's blood pressure, pulse, and                            oxygen saturations were monitored continuously. The                            [id number]-[id number] ([id number]) Olympus endoscope was introduced                            through the mouth, and advanced to the second part                            of duodenum. The upper GI endoscopy was                            accomplished without difficulty. The patient                            tolerated the procedure well. Scope In: Scope Out: Findings:      The esophagus was normal.      Diffuse moderate inflammation characterized by erosions and erythema was       found in the gastric fundus and in the gastric body.      A large frond-like/villous and fungating mass with bleeding was found in       the second portion of the duodenum. Biopsies were taken with a cold       forceps for histology. To stop active bleeding, hemostatic spray was       deployed. Multiple sprays were applied. There was no bleeding at the end       of the procedure.      In the second portion of the duodenum there was evidence of a large       friable mass. It was slowly oozing blood. The mass was in close       proximity to  the ampulla, however, it was difficult to determine if       there was ampullary involvement. Multiple cold biopsies were obtained.       Nexpowder was sprayed to control the spontaneous bleeding. Impression:               - Normal esophagus.                           - Gastritis.                           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                            hemostatic spray applied. Recommendation:           - Return patient to hospital ward for ongoing care.                           - Resume regular diet.                           - Continue present medications.                           - Await pathology results.                            - ? Benefit of a non contrast CT scan.                           - Follow HGB and transfuse as necessary. Procedure Code(s):        --- Professional ---                           [phone], 59, Esophagogastroduodenoscopy, flexible,                            transoral; with control of bleeding, any method                           43239, Esophagogastroduodenoscopy, flexible,                            transoral; with biopsy, single or multiple Diagnosis Code(s):        --- Professional ---                           K31.89, Other diseases of stomach and duodenum                           K29.70, Gastritis, unspecified, without bleeding                           K92.1, Melena (includes Hematochezia) CPT copyright 2022 [employer]. All rights reserved. The codes documented in this report are preliminary and upon coder review may  be revised to meet current compliance requirements. Belvie  Rollin, MD Belvie Rollin, MD 01/17/2024 2:58:48 PM This report has been signed electronically. Number of Addenda: 0

## 2024-01-17 NOTE — Progress Notes (Signed)
 PROGRESS NOTE  ARMON ORVIS FMW:[id number] DOB: [birth year] DOA: 01/16/2024 PCP: [facility] [address]  HPI/Recap of past 24 hours: Jon Anderson is a 73 y.o. 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 Patient also reporting chronic back pain. In the ED, pt was found to be borderline hypotensive. CBC was obtained that showed hemoglobin of 7, baseline appears to be around 9-10. Hemoccult was positive. Chest x-ray with no acute findings. GI was consulted.  Patient admitted for further management.   Today, saw patient prior to EGD, denied any new complaints.   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   Weight as of this encounter: 78.9 kg.     Code Status: Full  Family Communication: None at  bedside  Disposition Plan: Status is: Inpatient Remains inpatient appropriate because: Level of care      Consultants: EGD  Procedures: EGD on 10/9  Antimicrobials: None  DVT prophylaxis: SCDs   Objective: Vitals:   01/17/24 1245 01/17/24 1250 01/17/24 1255 01/17/24 1358  BP: (!) 129/59 134/71 139/64 109/68  Pulse: 73 75 74 77  Resp: 17 18 12    Temp:    97.6 F (36.4 C)  TempSrc:    Oral  SpO2: 95% 100% 96% 98%  Weight:      Height:        Intake/Output Summary (Last 24 hours) at 01/17/2024 1502 Last data filed at 01/17/2024 1229 Gross per 24 hour  Intake 1091.51 ml  Output --  Net 1091.51 ml   Filed Weights   01/16/24 1207 01/17/24 1126  Weight: 78.9 kg 78.9 kg    Exam: General: NAD  Cardiovascular: S1, S2 present Respiratory: CTAB Abdomen: Soft, nontender, nondistended, bowel sounds present Musculoskeletal: No bilateral pedal edema noted Skin: Normal Psychiatry: Normal mood     Data Reviewed: CBC: Recent Labs  Lab 01/16/24 1230 01/17/24 0246 01/17/24 0910  WBC 5.5 5.8  --   NEUTROABS 3.7  --   --   HGB 7.0* 8.3* 7.0*  HCT 22.4* 25.4* 21.2*  MCV 95.3 89.4  --   PLT 168 143*  --    Basic Metabolic Panel: Recent Labs  Lab 01/16/24 1230 01/17/24 0246  NA 137 135  K 3.4* 3.9  CL 94* 94*  CO2 30 26  GLUCOSE 139* 113*  BUN  32* 46*  CREATININE 5.07* 6.65*  CALCIUM  8.7* 8.6*   GFR: Estimated Creatinine Clearance: 9.7 mL/min (A) (by C-G formula based on SCr of 6.65 mg/dL (H)). Liver Function Tests: Recent Labs  Lab 01/16/24 1230  AST 12*  ALT 9  ALKPHOS 52  BILITOT 0.3  PROT 5.6*  ALBUMIN  2.7*   No results for input(s): LIPASE, AMYLASE in the last 168 hours. No results for input(s): AMMONIA in the last 168 hours. Coagulation Profile: No results for input(s): INR, PROTIME in the last 168 hours. Cardiac Enzymes: No results for input(s): CKTOTAL, CKMB, CKMBINDEX, TROPONINI in the last 168 hours. BNP (last 3  results) No results for input(s): PROBNP in the last 8760 hours. HbA1C: No results for input(s): HGBA1C in the last 72 hours. CBG: No results for input(s): GLUCAP in the last 168 hours. Lipid Profile: No results for input(s): CHOL, HDL, LDLCALC, TRIG, CHOLHDL, LDLDIRECT in the last 72 hours. Thyroid Function Tests: No results for input(s): TSH, T4TOTAL, FREET4, T3FREE, THYROIDAB in the last 72 hours. Anemia Panel: Recent Labs    01/17/24 0910 01/17/24 1055  VITAMINB12  --  <150*  FOLATE 11.0  --   FERRITIN  --  450*  TIBC  --  203*  IRON  --  46   Urine analysis:    Component Value Date/Time   COLORURINE YELLOW 03/18/2017 1721   APPEARANCEUR CLEAR 03/18/2017 1721   LABSPEC 1.007 03/18/2017 1721   PHURINE 9.0 (H) 03/18/2017 1721   GLUCOSEU 150 (A) 03/18/2017 1721   HGBUR NEGATIVE 03/18/2017 1721   BILIRUBINUR NEGATIVE 03/18/2017 1721   KETONESUR NEGATIVE 03/18/2017 1721   PROTEINUR >=300 (A) 03/18/2017 1721   UROBILINOGEN 0.2 06/18/2014 2308   NITRITE NEGATIVE 03/18/2017 1721   LEUKOCYTESUR NEGATIVE 03/18/2017 1721   Sepsis Labs: @LABRCNTIP (procalcitonin:4,lacticidven:4)  )No results found for this or any previous visit (from the past 240 hours).    Studies: No results found.  Scheduled Meds:  sodium chloride    Intravenous Once   Chlorhexidine  Gluconate Cloth  6 each Topical Daily   [START ON 01/18/2024] darbepoetin (ARANESP ) injection - DIALYSIS  60 mcg Subcutaneous Q Fri-1800   pantoprazole  (PROTONIX ) IV  40 mg Intravenous Q12H   sodium chloride  flush  10-40 mL Intracatheter Q12H    Continuous Infusions:   LOS: 1 day     Lebron JINNY Cage, MD [facility]  If 7PM-7AM, please contact night-coverage [email or web address] 01/17/2024, 3:02 PM

## 2024-01-17 NOTE — Consult Note (Signed)
 [facility] Renal Consultation Note    Indication for Consultation:  Management of ESRD/hemodialysis; anemia, hypertension/volume and secondary hyperparathyroidism   HPI: Jon Anderson is a 73 y.o. male with ESRD on HD MWF, CAD, HTN, PAD s/p TMA who is admitted with melena/GI bleed. Presented to the ED just after dialysis Monday. He was hypotensive on admission. Labs notable for Hb 7.0 and positive FOBT.  He was transfused 2 units prbcs.  GI consulted and planning EGD today.   Dialysis MWF at [facility]. He has not missed any dialysis. Last dialyzed Wednesday. Of note he has had worsening swelling and discomfort in his R arm AVG. He was scheduled for outpatient evaluation with [facility].    Seen and examined at bedside. Alert, feeling a little better today. Denies cp, sob, n/v.     Past Medical History:  Diagnosis Date   Anemia of chronic disease    Arthritis    CAD (coronary artery disease)    Carotid stenosis    a. <50% RICA, >70% LICA by duplex 07/2015   Chronic chest pain    occ   Constipation    chronic   Dyspnea    occasional with extertion   ESRD (end stage renal disease) on dialysis (HCC)    M-W-F- [facility]   GERD (gastroesophageal reflux disease)    History of hiatal hernia    History of kidney stones    Passed   HNP (herniated nucleus pulposus), lumbar    HTN (hypertension)    echo 3/10: EF 60%, LAE   Hyperlipidemia    Mitral stenosis    mild MS 11/15/21   Nephrolithiasis    passed them all   PEA (Pulseless electrical activity) (HCC) 11/15/2021   PEA arrest felt likely related to contrast anaphylaxis despite premedication   Peripheral arterial disease    a. s/p PTCA  right    Pneumonia yrs ago   Restless legs    Sciatic leg pain    Sleep apnea    does not use cpap   Snores    a. presumed OSA, pt has refused sleep eval in past.   Type II diabetes mellitus (HCC)    no longer on medications, checks blood glucose at home   Urinary frequency    Uses  wheelchair    Walker as ambulation aid    Past Surgical History:  Procedure Laterality Date   A/V FISTULAGRAM Left 01/10/2021   Procedure: A/V FISTULAGRAM;  Surgeon: Sheree Penne Bruckner, MD;  Location: [facility] INVASIVE CV LAB;  Service: Cardiovascular;  Laterality: Left;   A/V FISTULAGRAM Left 05/19/2021   Procedure: A/V Fistulagram;  Surgeon: Gretta Bruckner PARAS, MD;  Location: [facility] INVASIVE CV LAB;  Service: Cardiovascular;  Laterality: Left;   ABDOMINAL AORTOGRAM W/LOWER EXTREMITY Bilateral 08/21/2019   Procedure: ABDOMINAL AORTOGRAM W/LOWER EXTREMITY;  Surgeon: Sheree Penne Bruckner, MD;  Location: [facility] INVASIVE CV LAB;  Service: Cardiovascular;  Laterality: Bilateral;   AMPUTATION Right 07/25/2019   Procedure: RIGHT 5th RAY AMPUTATION;  Surgeon: Harden Jerona GAILS, MD;  Location: [facility] OR;  Service: Orthopedics;  Laterality: Right;   AMPUTATION Right 11/19/2019   Procedure: RIGHT TRANSMETATARSAL AMPUTATION;  Surgeon: Harden Jerona GAILS, MD;  Location: [facility] OR;  Service: Orthopedics;  Laterality: Right;   ANGIOPLASTY / STENTING FEMORAL Left 12/11/2013   dr berry   AV FISTULA PLACEMENT Left 03/19/2014   Procedure: CREATION OF ARTERIOVENOUS (AV) FISTULA  LEFT UPPER ARM;  Surgeon: Lynwood JONETTA Collum, MD;  Location: [facility] OR;  Service: Vascular;  Laterality: Left;   AV FISTULA PLACEMENT Left 11/09/2020   Procedure: LEFT ARM ARTERIOVENOUS (AV) FISTULA CREATION;  Surgeon: Sheree Penne Bruckner, MD;  Location: [facility] OR;  Service: Vascular;  Laterality: Left;   AV FISTULA PLACEMENT Left 02/15/2021   Procedure: INSERTION OF LEFT ARM ARTERIOVENOUS GORE-TEX GRAFT;  Surgeon: Sheree Penne Bruckner, MD;  Location: [facility] OR;  Service: Vascular;  Laterality: Left;   AV FISTULA PLACEMENT Right 02/16/2022   Procedure: RIGHT ARM BRACHIOCEPAHLIC ARTERIOVENOUS (AV) FISTULA CREATION;  Surgeon: Sheree Penne Bruckner, MD;  Location: [facility] OR;  Service: Vascular;  Laterality: Right;  Regional and MAC   AV FISTULA PLACEMENT Right  06/19/2023   Procedure: INSERTION OF ARTERIOVENOUS (AV) GORE-TEX GRAFT RIGHT ARM;  Surgeon: Pearline Norman RAMAN, MD;  Location: [facility] OR;  Service: Vascular;  Laterality: Right;   BACK SURGERY  01/2018   screws placed    BASCILIC VEIN TRANSPOSITION Left 12/14/2020   Procedure: REVISION OF LEFT ARM SECOND STAGE BASILIC VEIN TRANSPOSITION;  Surgeon: Sheree Penne Bruckner, MD;  Location: [facility] OR;  Service: Vascular;  Laterality: Left;   BASCILIC VEIN TRANSPOSITION Right 04/18/2022   Procedure: RIGHT BRACHIOBASILIC FISTULA SECOND STAGE TRANSPOSITION;  Surgeon: Sheree Penne Bruckner, MD;  Location: [facility] OR;  Service: Vascular;  Laterality: Right;   CARDIAC CATHETERIZATION  2001 and 2010    COLONOSCOPY W/ BIOPSIES AND POLYPECTOMY     COLONOSCOPY WITH PROPOFOL  N/A 08/01/2016   Procedure: COLONOSCOPY WITH PROPOFOL ;  Surgeon: Belvie Just, MD;  Location: [facility]COPY;  Service: Endoscopy;  Laterality: N/A;   COLONOSCOPY WITH PROPOFOL  N/A 01/17/2022   Procedure: COLONOSCOPY WITH PROPOFOL ;  Surgeon: Just Belvie, MD;  Location: [facility]COPY;  Service: Gastroenterology;  Laterality: N/A;   CORONARY ARTERY BYPASS GRAFT  2019   [facility] x [facility] bypass   ESOPHAGOGASTRODUODENOSCOPY (EGD) WITH PROPOFOL  N/A 08/01/2016   Procedure: ESOPHAGOGASTRODUODENOSCOPY (EGD) WITH PROPOFOL ;  Surgeon: Belvie Just, MD;  Location: [facility];  Service: Endoscopy;  Laterality: N/A;   FISTULA SUPERFICIALIZATION Left 02/10/2020   Procedure: 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;  Surgeon: Oris Krystal FALCON, MD;  Location: [facility] OR;  Service: Vascular;  Laterality: Left;   FISTULA SUPERFICIALIZATION Left 03/23/2020   Procedure: LEFT UPPER EXTREMITY ARTERIOVENOUS FISTULA PLICATION;  Surgeon: Oris Krystal FALCON, MD;  Location: [facility] OR;  Service: Vascular;  Laterality: Left;   FOOT FRACTURE SURGERY Right    ligament repair   FRACTURE SURGERY     left forearm   GRAFT APPLICATION Right 05/13/2019   Procedure: FAT GRAFT APPLICATION;   Surgeon: Gretel Ozell PARAS, DPM;  Location: [facility] [place];  Service: Podiatry;  Laterality: Right;   HEMATOMA EVACUATION Left 11/09/2020   Procedure: EVACUATION HEMATOMA LEFT ARM;  Surgeon: Eliza Bruckner RAMAN, MD;  Location: [facility] OR;  Service: Vascular;  Laterality: Left;   INGUINAL HERNIA REPAIR Left    IR AV DIALY SHUNT INTRO NEEDLE/INTRACATH INITIAL W/PTA/IMG LEFT  11/15/2021   IR AV DIALY SHUNT INTRO NEEDLE/INTRACATH INITIAL W/PTA/IMG RIGHT Right 08/15/2022   IR FLUORO GUIDE CV LINE LEFT  01/26/2023   IR FLUORO GUIDE CV LINE RIGHT  01/20/2022   IR FLUORO GUIDE CV LINE RIGHT  05/03/2022   IR REMOVAL TUN CV CATH W/O FL  06/16/2021   IR REMOVAL TUN CV CATH W/O FL  07/06/2022   IR US  GUIDE VASC ACCESS LEFT  11/15/2021   IR US  GUIDE VASC ACCESS LEFT  11/15/2021   IR US  GUIDE VASC ACCESS LEFT  01/26/2023   IR  US  GUIDE VASC ACCESS RIGHT  11/15/2021   IR US  GUIDE VASC ACCESS RIGHT  01/20/2022   IR US  GUIDE VASC ACCESS RIGHT  08/15/2022   LEFT HEART CATH AND CORS/GRAFTS ANGIOGRAPHY N/A 04/27/2017   Procedure: LEFT HEART CATH AND CORS/GRAFTS ANGIOGRAPHY;  Surgeon: Anner Alm ORN, MD;  Location: [facility] INVASIVE CV LAB;  Service: Cardiovascular;  Laterality: N/A;   LEFT HEART CATHETERIZATION WITH CORONARY ANGIOGRAM N/A 06/22/2014   Procedure: LEFT HEART CATHETERIZATION WITH CORONARY ANGIOGRAM;  Surgeon: Debby DELENA Sor, MD;  Location: [facility] CATH LAB;  Service: Cardiovascular;  Laterality: N/A;   LOWER EXTREMITY ANGIOGRAM Left 12/11/2013   Procedure: LOWER EXTREMITY ANGIOGRAM;  Surgeon: Dorn JINNY Lesches, MD;  Location: [facility] CATH LAB;  Service: Cardiovascular;  Laterality: Left;   LUMBAR LAMINECTOMY/DECOMPRESSION MICRODISCECTOMY Right 07/03/2017   Procedure: MICRODISCECTOMY LUMBAR FIVE - SACRAL ONE RIGHT;  Surgeon: Lanis Pupa, MD;  Location: [facility] OR;  Service: Neurosurgery;  Laterality: Right;   LUMBAR LAMINECTOMY/DECOMPRESSION MICRODISCECTOMY Right 10/19/2017   Procedure: MICRODISCECTOMY LUMBAR FIVE- SACRAL 1  ONE ;  Surgeon: Lanis Pupa, MD;  Location: [facility] OR;  Service: Neurosurgery;  Laterality: Right;   PERIPHERAL VASCULAR BALLOON ANGIOPLASTY Left 05/19/2021   Procedure: PERIPHERAL VASCULAR BALLOON ANGIOPLASTY;  Surgeon: Gretta Lonni JINNY, MD;  Location: [facility] INVASIVE CV LAB;  Service: Cardiovascular;  Laterality: Left;   PERIPHERAL VASCULAR INTERVENTION Left 01/10/2021   Procedure: PERIPHERAL VASCULAR INTERVENTION;  Surgeon: Sheree Penne Lonni, MD;  Location: [facility] INVASIVE CV LAB;  Service: Cardiovascular;  Laterality: Left;   POLYPECTOMY  01/17/2022   Procedure: POLYPECTOMY;  Surgeon: Rollin Dover, MD;  Location: [facility];  Service: Gastroenterology;;   THROMBECTOMY W/ EMBOLECTOMY Left 11/09/2020   Procedure: THROMBECTOMY OF LEFT ARTERIOVENOUS FISTULA;  Surgeon: Eliza Lonni RAMAN, MD;  Location: [facility] OR;  Service: Vascular;  Laterality: Left;   TONSILLECTOMY AND ADENOIDECTOMY     WISDOM TOOTH EXTRACTION     WOUND DEBRIDEMENT Right 05/13/2019   Procedure: DEBRIDEMENT WOUND;  Surgeon: Gretel Ozell JINNY, DPM;  Location: [facility];  Service: Podiatry;  Laterality: Right;   Family History  Problem Relation Age of Onset   Heart attack Sister        died @ 17   Cancer Mother        died @ 25; unknown type   Diabetes Brother        deceased   Cirrhosis Father        alcohol related   Diabetes Father    Esophageal cancer Neg Hx    Colon cancer Neg Hx    Pancreatic cancer Neg Hx    Stomach cancer Neg Hx    Social History:  reports that he quit smoking about 47 years ago. His smoking use included cigarettes. He started smoking about 49 years ago. He has a 2 pack-year smoking history. He has never been exposed to tobacco smoke. He has never used smokeless tobacco. He reports that he does not currently use alcohol. He reports that he does not currently use drugs after having used the following drugs: Marijuana. Allergies  Allergen Reactions   Contrast Media [Iodinated  Contrast Media] Anaphylaxis, Hives, Shortness Of Breath, Swelling and Other (See Comments)    Cardiac arrest Throat swelling   Kiwi Extract Itching and Swelling    Lip, facial swelling - does not affect breathing   Flexeril  [Cyclobenzaprine ] Other (See Comments)    Weakness in hands, unable to hold items   Tape Rash    Blistering rash  Reaction  occurs with plastic tapes   Prior to Admission medications   Medication Sig Start Date End Date Taking? Authorizing Provider  acetaminophen  (TYLENOL ) 500 MG tablet Take 1,000 mg by mouth 2 (two) times daily as needed for headache or fever (pain).   Yes [provider]  aspirin  EC 81 MG tablet Take 81 mg by mouth at bedtime.   Yes [provider]  CALCITRIOL  PO Take 2 mcg by mouth every Monday, Wednesday, and Friday with hemodialysis. Provided and administered by dialysis clinic   Yes [provider]  calcium  acetate (PHOSLO ) 667 MG capsule Take 2 capsules (1,334 mg total) by mouth 2 (two) times daily with a meal. Patient taking differently: Take 1,334 mg by mouth 3 (three) times daily with meals. 12/01/19  Yes Angiulli, Toribio PARAS, PA-C  clopidogrel  (PLAVIX ) 75 MG tablet Take 75 mg by mouth daily. 12/01/19  Yes [provider]  gabapentin  (NEURONTIN ) 100 MG capsule Take 100-200 mg by mouth See admin instructions. Take 1 capsule (100mg ) by mouth every morning and take 2 capsules (200mg ) at bedtime. 11/28/19  Yes [provider]  HYDROcodone -acetaminophen  (NORCO) 7.5-325 MG tablet Take 1 tablet by mouth every 8 (eight) hours as needed for severe pain (pain score 7-10).   Yes [provider]  Methoxy PEG-Epoetin Beta (MIRCERA) 50 MCG/0.3ML SOSY Inject 50 mcg into the vein every 14 (fourteen) days. Provided and administered by dialysis clinic   Yes [provider]  pantoprazole  (PROTONIX ) 40 MG tablet Take 40 mg by mouth daily.   Yes [provider]   Current Facility-Administered  Medications  Medication Dose Route Frequency Provider Last Rate Last Admin   [MAR Hold] 0.9 %  sodium chloride  infusion (Manually program via Guardrails IV Fluids)   Intravenous Once Dasie Faden, MD       0.9 %  sodium chloride  infusion   Intravenous Continuous Rollin Dover, MD       [facility] Hold] acetaminophen  (TYLENOL ) tablet 650 mg  650 mg Oral Q6H PRN Khan, Ghalib, MD       Or   [facility] Hold] acetaminophen  (TYLENOL ) suppository 650 mg  650 mg Rectal Q6H PRN Khan, Ghalib, MD       [facility] Hold] Chlorhexidine  Gluconate Cloth 2 % PADS 6 each  6 each Topical Daily Khan, Ghalib, MD   6 each at 01/17/24 0829   [facility] Hold] HYDROmorphone  (DILAUDID ) tablet 2 mg  2 mg Oral Q4H PRN Khan, Ghalib, MD   2 mg at 01/17/24 0655   [MAR Hold] pantoprazole  (PROTONIX ) injection 40 mg  40 mg Intravenous Q12H Khan, Ghalib, MD   40 mg at 01/17/24 0827   [facility] Hold] sodium chloride  flush (NS) 0.9 % injection 10-40 mL  10-40 mL Intracatheter Q12H Fernand Prost, MD   10 mL at 01/17/24 0829   [facility] Hold] sodium chloride  flush (NS) 0.9 % injection 10-40 mL  10-40 mL Intracatheter PRN Fernand Prost, MD         ROS: As per HPI otherwise negative.  Physical Exam: Vitals:   01/17/24 0013 01/17/24 0354 01/17/24 0733 01/17/24 1126  BP: 119/69 (!) 109/46 116/60 (!) 131/39  Pulse: 93 92 82 76  Resp: 18 18 17 12   Temp: 98.3 F (36.8 C) 98.8 F (37.1 C) 98.6 F (37 C) (!) 97 F (36.1 C)  TempSrc: Oral Oral Oral Temporal  SpO2: 100% 100% 100% 95%  Weight:    78.9 kg  Height:    5' 9 (1.753 m)  General: Appears comfortable, in no distress  Head: NCAT sclera not icteric  Neck: Supple. No JVD appreciated  Lungs: Clear bilaterally  Heart: RRR Abdomen: soft non-tender  Lower extremities: no sig LE edema  Neuro: A & O X 3. Moves all extremities spontaneously. Psych:  Responds to questions appropriately with a normal affect. Dialysis Access: RUE AVG +bruit   Labs: Basic Metabolic Panel: Recent Labs  Lab 01/16/24 1230  01/17/24 0246  NA 137 135  K 3.4* 3.9  CL 94* 94*  CO2 30 26  GLUCOSE 139* 113*  BUN 32* 46*  CREATININE 5.07* 6.65*  CALCIUM  8.7* 8.6*   Liver Function Tests: Recent Labs  Lab 01/16/24 1230  AST 12*  ALT 9  ALKPHOS 52  BILITOT 0.3  PROT 5.6*  ALBUMIN  2.7*   No results for input(s): LIPASE, AMYLASE in the last 168 hours. No results for input(s): AMMONIA in the last 168 hours. CBC: Recent Labs  Lab 01/16/24 1230 01/17/24 0246 01/17/24 0910  WBC 5.5 5.8  --   NEUTROABS 3.7  --   --   HGB 7.0* 8.3* 7.0*  HCT 22.4* 25.4* 21.2*  MCV 95.3 89.4  --   PLT 168 143*  --    Cardiac Enzymes: No results for input(s): CKTOTAL, CKMB, CKMBINDEX, TROPONINI in the last 168 hours. CBG: No results for input(s): GLUCAP in the last 168 hours. Iron Studies: No results for input(s): IRON, TIBC, TRANSFERRIN, FERRITIN in the last 72 hours. Studies/Results: DG Chest Port 1 View Result Date: 01/16/2024 CLINICAL DATA:  Shortness of breath. EXAM: PORTABLE CHEST 1 VIEW COMPARISON:  10/28/2023 FINDINGS: Again noted is elevation of the right hemidiaphragm. Lungs are clear. Heart and mediastinum are within normal limits and stable. Atherosclerotic calcifications at the aortic arch. Negative for a pneumothorax. No acute bone abnormality. IMPRESSION: 1. No acute cardiopulmonary disease. 2. Chronic elevation of the right hemidiaphragm. Electronically Signed   By: Juliene Balder M.D.   On: 01/16/2024 13:05    Dialysis Orders:  [facility] MWF 4H BFR 400/1.5x EDW 76.5kg 2K/2Ca AVG  -No ESA orders  -calcitriol  2 mcg q HD   Assessment/Plan: Melena/GIB. GI following. For EGD today  ESRD.  HD MWF.  Has not missed HD. Continue on schedule. HD 10/10.   Access. LUE AVG placed 06/2023; worsening swelling since placement. He did have OP appt for f'gram with [facility] today. Will consult [facility] for eval while hospitalized.  Hypertension. BP/volume acceptable. Anemia. CKD + GIB. S/p 2 u prbcs. Not on ESA as  outpatient. Order Aranesp  here.  Metabolic bone disease.  Ca acceptable. Follow trends.    Maisie Ronnald Acosta PA-C [facility] 01/17/2024, 11:58 AM

## 2024-01-17 NOTE — Anesthesia Postprocedure Evaluation (Addendum)
 Anesthesia Post Note  Patient: Jon Anderson  Procedure(s) Performed: EGD (ESOPHAGOGASTRODUODENOSCOPY)     Patient location during evaluation: Endoscopy Anesthesia Type: MAC Level of consciousness: awake and alert, oriented and patient cooperative Pain management: pain level controlled Vital Signs Assessment: post-procedure vital signs reviewed and stable Respiratory status: spontaneous breathing, nonlabored ventilation and respiratory function stable Cardiovascular status: blood pressure returned to baseline and stable Postop Assessment: no apparent nausea or vomiting and able to ambulate Anesthetic complications: no   No notable events documented.  Last Vitals:  Vitals:   01/17/24 1255 01/17/24 1358  BP: 139/64 109/68  Pulse: 74 77  Resp: 12   Temp:  36.4 C  SpO2: 96% 98%    Last Pain:  Vitals:   01/17/24 1358  TempSrc: Oral  PainSc:                  [name],[name]

## 2024-01-17 NOTE — Transfer of Care (Signed)
 Immediate Anesthesia Transfer of Care Note  Patient: Jon Anderson  Procedure(s) Performed: EGD (ESOPHAGOGASTRODUODENOSCOPY)  Patient Location: PACU  Anesthesia Type:MAC  Level of Consciousness: sedated  Airway & Oxygen Therapy: Patient Spontanous Breathing  Post-op Assessment: Report given to RN  Post vital signs: Reviewed and stable  Last Vitals:  Vitals Value Taken Time  BP 83/53 01/17/24 12:25  Temp 36.1 C 01/17/24 12:22  Pulse 80 01/17/24 12:29  Resp 12 01/17/24 12:29  SpO2 100 % 01/17/24 12:29  Vitals shown include unfiled device data.  Last Pain:  Vitals:   01/17/24 1222  TempSrc: Temporal  PainSc: 0-No pain         Complications: No notable events documented.

## 2024-01-17 NOTE — Plan of Care (Signed)

## 2024-01-17 NOTE — Consult Note (Signed)
 Hospital Consult    Reason for Consult: Arm swelling associated with ipsilateral AV graft  MRN #:  [id number]  History of Present Illness: This is a 73 y.o. male who was admitted with a GI bleed and planned to undergo an endoscopy today.  Vascular was called as he is going to miss his clinic appointment today now that he is hospitalized.  He has had a right arm AV graft performed on March 02/2024.  He has recently noticed some right arm swelling.  Denies any issues with dialysis sessions.  Past Medical History:  Diagnosis Date   Anemia of chronic disease    Arthritis    CAD (coronary artery disease)    Carotid stenosis    a. <50% RICA, >70% LICA by duplex 07/2015   Chronic chest pain    occ   Constipation    chronic   Dyspnea    occasional with extertion   ESRD (end stage renal disease) on dialysis (HCC)    M-W-F- [facility]   GERD (gastroesophageal reflux disease)    History of hiatal hernia    History of kidney stones    Passed   HNP (herniated nucleus pulposus), lumbar    HTN (hypertension)    echo 3/10: EF 60%, LAE   Hyperlipidemia    Mitral stenosis    mild MS 11/15/21   Nephrolithiasis    passed them all   PEA (Pulseless electrical activity) (HCC) 11/15/2021   PEA arrest felt likely related to contrast anaphylaxis despite premedication   Peripheral arterial disease    a. s/p PTCA  right    Pneumonia yrs ago   Restless legs    Sciatic leg pain    Sleep apnea    does not use cpap   Snores    a. presumed OSA, pt has refused sleep eval in past.   Type II diabetes mellitus (HCC)    no longer on medications, checks blood glucose at home   Urinary frequency    Uses wheelchair    Walker as ambulation aid     Past Surgical History:  Procedure Laterality Date   A/V FISTULAGRAM Left 01/10/2021   Procedure: A/V FISTULAGRAM;  Surgeon: Sheree Penne Bruckner, MD;  Location: [facility] INVASIVE CV LAB;  Service: Cardiovascular;  Laterality: Left;   A/V FISTULAGRAM Left  05/19/2021   Procedure: A/V Fistulagram;  Surgeon: Gretta Bruckner PARAS, MD;  Location: [facility] INVASIVE CV LAB;  Service: Cardiovascular;  Laterality: Left;   ABDOMINAL AORTOGRAM W/LOWER EXTREMITY Bilateral 08/21/2019   Procedure: ABDOMINAL AORTOGRAM W/LOWER EXTREMITY;  Surgeon: Sheree Penne Bruckner, MD;  Location: [facility] INVASIVE CV LAB;  Service: Cardiovascular;  Laterality: Bilateral;   AMPUTATION Right 07/25/2019   Procedure: RIGHT 5th RAY AMPUTATION;  Surgeon: Harden Jerona GAILS, MD;  Location: [facility] OR;  Service: Orthopedics;  Laterality: Right;   AMPUTATION Right 11/19/2019   Procedure: RIGHT TRANSMETATARSAL AMPUTATION;  Surgeon: Harden Jerona GAILS, MD;  Location: [facility] OR;  Service: Orthopedics;  Laterality: Right;   ANGIOPLASTY / STENTING FEMORAL Left 12/11/2013   dr berry   AV FISTULA PLACEMENT Left 03/19/2014   Procedure: CREATION OF ARTERIOVENOUS (AV) FISTULA  LEFT UPPER ARM;  Surgeon: Lynwood JONETTA Collum, MD;  Location: [facility] OR;  Service: Vascular;  Laterality: Left;   AV FISTULA PLACEMENT Left 11/09/2020   Procedure: LEFT ARM ARTERIOVENOUS (AV) FISTULA CREATION;  Surgeon: Sheree Penne Bruckner, MD;  Location: [facility] OR;  Service: Vascular;  Laterality: Left;   AV FISTULA PLACEMENT Left 02/15/2021   Procedure:  INSERTION OF LEFT ARM ARTERIOVENOUS GORE-TEX GRAFT;  Surgeon: Sheree Penne Bruckner, MD;  Location: [facility] OR;  Service: Vascular;  Laterality: Left;   AV FISTULA PLACEMENT Right 02/16/2022   Procedure: RIGHT ARM BRACHIOCEPAHLIC ARTERIOVENOUS (AV) FISTULA CREATION;  Surgeon: Sheree Penne Bruckner, MD;  Location: [facility] OR;  Service: Vascular;  Laterality: Right;  Regional and MAC   AV FISTULA PLACEMENT Right 06/19/2023   Procedure: INSERTION OF ARTERIOVENOUS (AV) GORE-TEX GRAFT RIGHT ARM;  Surgeon: Pearline Norman RAMAN, MD;  Location: [facility] OR;  Service: Vascular;  Laterality: Right;   BACK SURGERY  01/2018   screws placed    BASCILIC VEIN TRANSPOSITION Left 12/14/2020   Procedure: REVISION OF LEFT ARM SECOND STAGE  BASILIC VEIN TRANSPOSITION;  Surgeon: Sheree Penne Bruckner, MD;  Location: [facility] OR;  Service: Vascular;  Laterality: Left;   BASCILIC VEIN TRANSPOSITION Right 04/18/2022   Procedure: RIGHT BRACHIOBASILIC FISTULA SECOND STAGE TRANSPOSITION;  Surgeon: Sheree Penne Bruckner, MD;  Location: [facility] OR;  Service: Vascular;  Laterality: Right;   CARDIAC CATHETERIZATION  2001 and 2010    COLONOSCOPY W/ BIOPSIES AND POLYPECTOMY     COLONOSCOPY WITH PROPOFOL  N/A 08/01/2016   Procedure: COLONOSCOPY WITH PROPOFOL ;  Surgeon: Belvie Just, MD;  Location: [facility]COPY;  Service: Endoscopy;  Laterality: N/A;   COLONOSCOPY WITH PROPOFOL  N/A 01/17/2022   Procedure: COLONOSCOPY WITH PROPOFOL ;  Surgeon: Just Belvie, MD;  Location: [facility];  Service: Gastroenterology;  Laterality: N/A;   CORONARY ARTERY BYPASS GRAFT  2019   [facility] x [facility] bypass   ESOPHAGOGASTRODUODENOSCOPY (EGD) WITH PROPOFOL  N/A 08/01/2016   Procedure: ESOPHAGOGASTRODUODENOSCOPY (EGD) WITH PROPOFOL ;  Surgeon: Belvie Just, MD;  Location: [facility]COPY;  Service: Endoscopy;  Laterality: N/A;   FISTULA SUPERFICIALIZATION Left 02/10/2020   Procedure: 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;  Surgeon: Oris Krystal FALCON, MD;  Location: [facility] OR;  Service: Vascular;  Laterality: Left;   FISTULA SUPERFICIALIZATION Left 03/23/2020   Procedure: LEFT UPPER EXTREMITY ARTERIOVENOUS FISTULA PLICATION;  Surgeon: Oris Krystal FALCON, MD;  Location: [facility] OR;  Service: Vascular;  Laterality: Left;   FOOT FRACTURE SURGERY Right    ligament repair   FRACTURE SURGERY     left forearm   GRAFT APPLICATION Right 05/13/2019   Procedure: FAT GRAFT APPLICATION;  Surgeon: Gretel Ozell PARAS, DPM;  Location: [facility] [place];  Service: Podiatry;  Laterality: Right;   HEMATOMA EVACUATION Left 11/09/2020   Procedure: EVACUATION HEMATOMA LEFT ARM;  Surgeon: Eliza Bruckner RAMAN, MD;  Location: [facility] OR;  Service: Vascular;  Laterality: Left;   INGUINAL HERNIA REPAIR  Left    IR AV DIALY SHUNT INTRO NEEDLE/INTRACATH INITIAL W/PTA/IMG LEFT  11/15/2021   IR AV DIALY SHUNT INTRO NEEDLE/INTRACATH INITIAL W/PTA/IMG RIGHT Right 08/15/2022   IR FLUORO GUIDE CV LINE LEFT  01/26/2023   IR FLUORO GUIDE CV LINE RIGHT  01/20/2022   IR FLUORO GUIDE CV LINE RIGHT  05/03/2022   IR REMOVAL TUN CV CATH W/O FL  06/16/2021   IR REMOVAL TUN CV CATH W/O FL  07/06/2022   IR US  GUIDE VASC ACCESS LEFT  11/15/2021   IR US  GUIDE VASC ACCESS LEFT  11/15/2021   IR US  GUIDE VASC ACCESS LEFT  01/26/2023   IR US  GUIDE VASC ACCESS RIGHT  11/15/2021   IR US  GUIDE VASC ACCESS RIGHT  01/20/2022   IR US  GUIDE VASC ACCESS RIGHT  08/15/2022   LEFT HEART CATH AND CORS/GRAFTS ANGIOGRAPHY N/A 04/27/2017   Procedure: LEFT HEART CATH AND CORS/GRAFTS ANGIOGRAPHY;  Surgeon: Anner Alm ORN, MD;  Location: [facility] INVASIVE CV LAB;  Service: Cardiovascular;  Laterality: N/A;   LEFT HEART CATHETERIZATION WITH CORONARY ANGIOGRAM N/A 06/22/2014   Procedure: LEFT HEART CATHETERIZATION WITH CORONARY ANGIOGRAM;  Surgeon: Debby DELENA Sor, MD;  Location: [facility] CATH LAB;  Service: Cardiovascular;  Laterality: N/A;   LOWER EXTREMITY ANGIOGRAM Left 12/11/2013   Procedure: LOWER EXTREMITY ANGIOGRAM;  Surgeon: Dorn JINNY Lesches, MD;  Location: [facility] CATH LAB;  Service: Cardiovascular;  Laterality: Left;   LUMBAR LAMINECTOMY/DECOMPRESSION MICRODISCECTOMY Right 07/03/2017   Procedure: MICRODISCECTOMY LUMBAR FIVE - SACRAL ONE RIGHT;  Surgeon: Lanis Pupa, MD;  Location: [facility] OR;  Service: Neurosurgery;  Laterality: Right;   LUMBAR LAMINECTOMY/DECOMPRESSION MICRODISCECTOMY Right 10/19/2017   Procedure: MICRODISCECTOMY LUMBAR FIVE- SACRAL 1 ONE ;  Surgeon: Lanis Pupa, MD;  Location: [facility] OR;  Service: Neurosurgery;  Laterality: Right;   PERIPHERAL VASCULAR BALLOON ANGIOPLASTY Left 05/19/2021   Procedure: PERIPHERAL VASCULAR BALLOON ANGIOPLASTY;  Surgeon: Gretta Lonni JINNY, MD;  Location: [facility] INVASIVE CV LAB;  Service: Cardiovascular;   Laterality: Left;   PERIPHERAL VASCULAR INTERVENTION Left 01/10/2021   Procedure: PERIPHERAL VASCULAR INTERVENTION;  Surgeon: Sheree Penne Lonni, MD;  Location: [facility] INVASIVE CV LAB;  Service: Cardiovascular;  Laterality: Left;   POLYPECTOMY  01/17/2022   Procedure: POLYPECTOMY;  Surgeon: Rollin Dover, MD;  Location: [facility];  Service: Gastroenterology;;   THROMBECTOMY W/ EMBOLECTOMY Left 11/09/2020   Procedure: THROMBECTOMY OF LEFT ARTERIOVENOUS FISTULA;  Surgeon: Eliza Lonni RAMAN, MD;  Location: [facility] OR;  Service: Vascular;  Laterality: Left;   TONSILLECTOMY AND ADENOIDECTOMY     WISDOM TOOTH EXTRACTION     WOUND DEBRIDEMENT Right 05/13/2019   Procedure: DEBRIDEMENT WOUND;  Surgeon: Gretel Ozell JINNY, DPM;  Location: [facility] [place];  Service: Podiatry;  Laterality: Right;    Allergies  Allergen Reactions   Contrast Media [Iodinated Contrast Media] Anaphylaxis, Hives, Shortness Of Breath, Swelling and Other (See Comments)    Cardiac arrest Throat swelling   Kiwi Extract Itching and Swelling    Lip, facial swelling - does not affect breathing   Flexeril  [Cyclobenzaprine ] Other (See Comments)    Weakness in hands, unable to hold items   Tape Rash    Blistering rash  Reaction occurs with plastic tapes    Prior to Admission medications   Medication Sig Start Date End Date Taking? Authorizing Provider  acetaminophen  (TYLENOL ) 500 MG tablet Take 1,000 mg by mouth 2 (two) times daily as needed for headache or fever (pain).   Yes [provider]  aspirin  EC 81 MG tablet Take 81 mg by mouth at bedtime.   Yes [provider]  CALCITRIOL  PO Take 2 mcg by mouth every Monday, Wednesday, and Friday with hemodialysis. Provided and administered by dialysis clinic   Yes [provider]  calcium  acetate (PHOSLO ) 667 MG capsule Take 2 capsules (1,334 mg total) by mouth 2 (two) times daily with a meal. Patient taking differently: Take 1,334 mg by mouth  3 (three) times daily with meals. 12/01/19  Yes Angiulli, Toribio JINNY, PA-C  clopidogrel  (PLAVIX ) 75 MG tablet Take 75 mg by mouth daily. 12/01/19  Yes [provider]  gabapentin  (NEURONTIN ) 100 MG capsule Take 100-200 mg by mouth See admin instructions. Take 1 capsule (100mg ) by mouth every morning and take 2 capsules (200mg ) at bedtime. 11/28/19  Yes [provider]  HYDROcodone -acetaminophen  (NORCO) 7.5-325 MG tablet Take 1 tablet by mouth every 8 (eight) hours as needed for severe pain (pain score 7-10).  Yes [provider]  Methoxy PEG-Epoetin Beta (MIRCERA) 50 MCG/0.3ML SOSY Inject 50 mcg into the vein every 14 (fourteen) days. Provided and administered by dialysis clinic   Yes [provider]  pantoprazole  (PROTONIX ) 40 MG tablet Take 40 mg by mouth daily.   Yes [provider]    Social History   Socioeconomic History   Marital status: Married    Spouse name: Not on file   Number of children: Not on file   Years of education: Not on file   Highest education level: Not on file  Occupational History   Occupation: retired  Tobacco Use   Smoking status: Former    Current packs/day: 0.00    Average packs/day: 1 pack/day for 2.0 years (2.0 ttl pk-yrs)    Types: Cigarettes    Start date: 12    Quit date: 1978    Years since quitting: 47.8    Passive exposure: Never   Smokeless tobacco: Never  Vaping Use   Vaping status: Never Used  Substance and Sexual Activity   Alcohol use: Not Currently    Alcohol/week: 0.0 standard drinks of alcohol    Comment: h/o social drinking   Drug use: Not Currently    Types: Marijuana    Comment: quit 1986   Sexual activity: Yes    Birth control/protection: None  Other Topics Concern   Not on file  Social History Narrative   The patient is a Copy.  He is married and has 2 grown children.  Lives in [place] with his wife.  Denies tobacco, alcohol or IV drug abuse or marijuana or cocaine intake.     Social Drivers of Corporate investment banker Strain: Not on file  Food Insecurity: Food Insecurity Present (01/16/2024)   Hunger Vital Sign    Worried About Running Out of Food in the Last Year: Sometimes true    Ran Out of Food in the Last Year: Often true  Transportation Needs: No Transportation Needs (01/16/2024)   PRAPARE - Administrator, Civil Service (Medical): No    Lack of Transportation (Non-Medical): No  Physical Activity: Not on file  Stress: Not on file  Social Connections: Moderately Integrated (01/16/2024)   Social Connection and Isolation Panel    Frequency of Communication with Friends and Family: More than three times a week    Frequency of Social Gatherings with Friends and Family: Twice a week    Attends Religious Services: 1 to 4 times per year    Active Member of [employer] or Organizations: No    Attends Banker Meetings: Never    Marital Status: Married  Catering manager Violence: Not At Risk (01/16/2024)   Humiliation, Afraid, Rape, and Kick questionnaire    Fear of Current or Ex-Partner: No    Emotionally Abused: No    Physically Abused: No    Sexually Abused: No    Family History  Problem Relation Age of Onset   Heart attack Sister        died @ 70   Cancer Mother        died @ 21; unknown type   Diabetes Brother        deceased   Cirrhosis Father        alcohol related   Diabetes Father    Esophageal cancer Neg Hx    Colon cancer Neg Hx    Pancreatic cancer Neg Hx    Stomach cancer Neg Hx  ROS: Otherwise negative unless mentioned in HPI  Physical Examination  Vitals:   01/17/24 0733 01/17/24 1126  BP: 116/60 (!) 131/39  Pulse: 82 76  Resp: 17 12  Temp: 98.6 F (37 C) (!) 97 F (36.1 C)  SpO2: 100% 95%   Body mass index is 25.69 kg/m.  General: no acute distress Neuro: alert, no focal deficit Extremities: Palpable thrill in right arm AVG, 1+ edema from hands to mid upper arm on right   Data:   Upper  extremity venous duplex from July reviewed No evidence of DVT bilaterally  ASSESSMENT/PLAN: This is a 73 y.o. male with a well-functioning right upper extremity AV graft.  He has been having some arm swelling recently.  Will plan for outpatient fistulogram at the HD access center.  To be scheduled by office.    Norman GORMAN Serve MD [facility] [phone] 01/17/2024  12:09 PM

## 2024-01-17 NOTE — Progress Notes (Signed)
 The gross findings were discussed with the patient.  Per his request I reached out to his wife, but it went to voicemail.

## 2024-01-17 NOTE — Anesthesia Preprocedure Evaluation (Addendum)
 Anesthesia Evaluation  Patient identified by MRN, date of birth, ID band Patient awake    Reviewed: Allergy & Precautions, NPO status , Patient's Chart, lab work & pertinent test results  History of Anesthesia Complications Negative for: history of anesthetic complications  Airway Mallampati: II  TM Distance: >3 FB Neck ROM: Full    Dental  (+) Dental Advisory Given, Missing, Poor Dentition   Pulmonary sleep apnea (does not use CPAP) , former smoker   breath sounds clear to auscultation       Cardiovascular hypertension, (-) angina + CAD, + Cardiac Stents and + Peripheral Vascular Disease  + Valvular Problems/Murmurs (mild MS)  Rhythm:Regular Rate:Normal  '23 ECHO: EF  >75%. 1. The LV has hyperdynamic function, no regional wall motion abnormalities. There is moderate concentric left ventricular hypertrophy. Grade I diastolic dysfunction  (impaired relaxation). Elevated left atrial pressure.   2. RVF is normal. The right ventricular size is normal.   3. Left atrial size was mildly dilated.   4. The mitral valve is degenerative. No evidence of mitral valve regurgitation. Mild mitral stenosis. The mean mitral valve gradient is 7.0 mmHg with average heart rate of 118 bpm. Severe mitral annular calcification.   5. The aortic valve is tricuspid. There is mild calcification of the aortic valve. There is mild thickening of the aortic valve. Aortic valve regurgitation is not visualized. Aortic valve sclerosis is present, with no evidence of aortic valve stenosis.     Neuro/Psych Dizziness with dialysis yesterday    GI/Hepatic Neg liver ROS, hiatal hernia,GERD  Medicated and Controlled,,  Endo/Other  diabetes (glu 113)    Renal/GU Dialysis and ESRFRenal diseaseK+ 3.9, dialyzed yesterday     Musculoskeletal  (+) Arthritis ,    Abdominal   Peds  Hematology Hb 7.0, plt 143k plavix    Anesthesia Other Findings    Reproductive/Obstetrics                              Anesthesia Physical Anesthesia Plan  ASA: 4  Anesthesia Plan: MAC   Post-op Pain Management: Minimal or no pain anticipated   Induction:   PONV Risk Score and Plan: 1 and Treatment may vary due to age or medical condition  Airway Management Planned: Natural Airway and Simple Face Mask  Additional Equipment: None  Intra-op Plan:   Post-operative Plan:   Informed Consent: I have reviewed the patients History and Physical, chart, labs and discussed the procedure including the risks, benefits and alternatives for the proposed anesthesia with the patient or authorized representative who has indicated his/her understanding and acceptance.     Dental advisory given  Plan Discussed with: CRNA and Surgeon  Anesthesia Plan Comments:          Anesthesia Quick Evaluation

## 2024-01-18 DIAGNOSIS — D649 Anemia, unspecified: Secondary | ICD-10-CM | POA: Diagnosis not present

## 2024-01-18 LAB — RENAL FUNCTION PANEL
Albumin: 2.7 g/dL — ABNORMAL LOW (ref 3.5–5.0)
Anion gap: 16 — ABNORMAL HIGH (ref 5–15)
BUN: 67 mg/dL — ABNORMAL HIGH (ref 8–23)
CO2: 27 mmol/L (ref 22–32)
Calcium: 8.9 mg/dL (ref 8.9–10.3)
Chloride: 92 mmol/L — ABNORMAL LOW (ref 98–111)
Creatinine, Ser: 9.04 mg/dL — ABNORMAL HIGH (ref 0.61–1.24)
GFR, Estimated: 6 mL/min — ABNORMAL LOW (ref >60)
Glucose, Bld: 90 mg/dL (ref 70–99)
Phosphorus: 5.3 mg/dL — ABNORMAL HIGH (ref 2.5–4.6)
Potassium: 4.1 mmol/L (ref 3.5–5.1)
Sodium: 135 mmol/L (ref 135–145)

## 2024-01-18 LAB — HEMOGLOBIN A1C
Hgb A1c MFr Bld: 5.9 % — ABNORMAL HIGH (ref 4.8–5.6)
Mean Plasma Glucose: 122.63 mg/dL

## 2024-01-18 LAB — HEMOGLOBIN AND HEMATOCRIT, BLOOD
HCT: 24.3 % — ABNORMAL LOW (ref 39.0–52.0)
HCT: 24.5 % — ABNORMAL LOW (ref 39.0–52.0)
HCT: 24.8 % — ABNORMAL LOW (ref 39.0–52.0)
Hemoglobin: 7.9 g/dL — ABNORMAL LOW (ref 13.0–17.0)
Hemoglobin: 8.2 g/dL — ABNORMAL LOW (ref 13.0–17.0)
Hemoglobin: 7.9 g/dL — ABNORMAL LOW (ref 13.0–17.0)

## 2024-01-18 LAB — HEPATITIS B SURFACE ANTIBODY,QUALITATIVE: Hep B S Ab: REACTIVE — AB

## 2024-01-18 LAB — SURGICAL PATHOLOGY

## 2024-01-18 LAB — GLUCOSE, CAPILLARY: Glucose-Capillary: 71 mg/dL (ref 70–99)

## 2024-01-18 LAB — HEPATITIS B SURFACE ANTIGEN: Hepatitis B Surface Ag: NONREACTIVE

## 2024-01-18 MED ORDER — PENTAFLUOROPROP-TETRAFLUOROETH EX AERO: 1.0000 | INHALATION_SPRAY | CUTANEOUS | Status: DC | PRN

## 2024-01-18 NOTE — Progress Notes (Signed)
 [facility] Progress Note   Assessment/ Plan:   Dialysis Orders:  [facility] MWF 4H BFR 400/1.5x EDW 76.5kg 2K/2Ca AVG  -No ESA orders  -calcitriol  2 mcg q HD    Assessment/Plan: Melena/GIB. GI following.s/p EGD with large duodenal mass, likely malignant, pathology pending. CT C/A/P yesterday showing some sclerotic bone lesions, prostatomegaly.  Recommended PSA correlation.  Per primary ESRD.  HD MWF.  Has not missed HD. Continue on schedule. HD 10/10.   Access. LUE AVG placed 06/2023; worsening swelling since placement. Seen by [facility]- for OP fgram Hypertension. BP/volume acceptable. Anemia. CKD + GIB. S/p 2 u prbcs. Not on ESA as outpatient. Unlikely to be ESA candidate with duodenal mass- hold Metabolic bone disease.  Ca acceptable. Follow trends.     Almarie Bonine MD [facility] 01/17/2024, 11:58 AM   Subjective:    Seen and examined.  Feeling well, no big issues.  Seen by [facility] yesterday.  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  Pathology pending.   Objective:   BP 118/65   Pulse 89   Temp 98.5 F (36.9 C)   Resp 18   Ht 5' 9 (1.753 m)   Wt 81.7 kg   SpO2 100%   BMI 26.60 kg/m   Intake/Output Summary (Last 24 hours) at 01/18/2024 1021 Last data filed at 01/17/2024 1229 Gross per 24 hour  Intake 127.09 ml  Output --  Net 127.09 ml   Weight change: -0.026 kg  Physical Exam: Gen:NAD CVS:RRR Resp: clear Abd: soft Ext: no LE edema ACCESS: R AVG, + swollen hand Imaging: CT CHEST ABDOMEN PELVIS WO CONTRAST Result Date: 01/17/2024 CLINICAL DATA:  Possible duodenal mass found on EGD. No contrast material due to allergy. EXAM: CT CHEST, ABDOMEN AND PELVIS WITHOUT CONTRAST TECHNIQUE: Multidetector CT imaging of the chest, abdomen and pelvis was performed following the standard protocol without IV contrast. RADIATION DOSE REDUCTION: This exam was performed according to the departmental dose-optimization program which includes  automated exposure control, adjustment of the mA and/or kV according to patient size and/or use of iterative reconstruction technique. COMPARISON:  Chest radiograph 01/16/2024. CT chest 11/05/2023. CT abdomen and pelvis 11/15/2021 FINDINGS: CT CHEST FINDINGS Cardiovascular: Cardiac enlargement. Calcification in the mitral valve annulus, aortic valve, aortic, and coronary arteries. No aortic aneurysm. Mediastinum/Nodes: Esophagus is decompressed. No significant lymphadenopathy. Thyroid gland is unremarkable. Incidental note of a vascular graft in the right arm, likely dialysis graft. Lungs/Pleura: Small bilateral pleural effusions with basilar atelectasis, greater on the left. Bilateral basilar bronchiectasis with bronchial wall thickening and interstitial fibrosis likely indicating chronic bronchitis. No pneumothorax. Musculoskeletal: Degenerative changes in the spine. Deformity with focal sclerosis in the mid sternum. This is similar to prior study and could represent healing fracture or possibly a metastatic lesion. Ribs appear intact CT ABDOMEN PELVIS FINDINGS Hepatobiliary: Cholelithiasis. No focal liver lesion. No bile duct dilatation. Pancreas: Unremarkable. No pancreatic ductal dilatation or surrounding inflammatory changes. Spleen: Normal in size without focal abnormality. Adrenals/Urinary Tract: 1.4 cm diameter left adrenal gland nodule with Hounsfield unit measurements of -7 consistent with fat containing adenoma. No change since prior study. No imaging follow-up is indicated. Bilateral renal parenchymal atrophy with subcentimeter cysts. No change. No imaging follow-up indicated. No hydronephrosis or hydroureter. Bladder is decompressed. Stomach/Bowel: Stomach is within normal limits. Appendix appears normal. No evidence of bowel wall thickening, distention, or inflammatory changes. Evaluation for focal lesion is limited without oral contrast material but no definitive duodenal mass lesion is identified.  There is a large duodenal diverticulum at the junction of the third and fourth portions of the duodenum. Possibly this could account for the lesion identified at endoscopy. Vascular/Lymphatic: Aortic atherosclerosis. No enlarged abdominal or pelvic lymph nodes. Reproductive: Prostate gland is enlarged, measuring 5.6 cm diameter. Other: No free air or free fluid in the abdomen. Surgical clips consistent with mesh hernia repair in the right groin. No residual recurrent hernia. Musculoskeletal: Postoperative fixation from L4 to the sacrum. Degenerative changes in the lumbar spine. Sclerosis and compression of the L3 vertebra, progressed since the prior lumbar spine MRI from 07/22/2022. This is possibly a pathologic fracture with metastatic lesion. IMPRESSION: 1. Chronic bronchitic changes in the lungs. Bilateral pleural effusions with basilar atelectasis, greater on the left. 2. Aortic atherosclerosis. 3. Cholelithiasis without evidence of acute cholecystitis. 4. Bilateral renal parenchymal atrophy. 5. Enlarged prostate gland. 6. Sclerotic bone lesions demonstrated in the sternum and L3 with mild compression of L3. These could represent healing fractures or metastatic pathologic fractures. Consider PSA correlation. 7. No definitive duodenal mass lesion identified. 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 Electronically Signed   By: Elsie Gravely M.D.   On: 01/17/2024 20:50   DG Chest Port 1 View Result Date: 01/16/2024 CLINICAL DATA:  Shortness of breath. EXAM: PORTABLE CHEST 1 VIEW COMPARISON:  10/28/2023 FINDINGS: Again noted is elevation of the right hemidiaphragm. Lungs are clear. Heart and mediastinum are within normal limits and stable. Atherosclerotic calcifications at the aortic arch. Negative for a pneumothorax. No acute bone abnormality. IMPRESSION: 1. No acute cardiopulmonary disease. 2. Chronic elevation of the right hemidiaphragm. Electronically Signed   By: Juliene Balder M.D.   On: 01/16/2024  13:05    Labs: BMET Recent Labs  Lab 01/16/24 1230 01/17/24 0246 01/18/24 0832  NA 137 135 135  K 3.4* 3.9 4.1  CL 94* 94* 92*  CO2 30 26 27   GLUCOSE 139* 113* 90  BUN 32* 46* 67*  CREATININE 5.07* 6.65* 9.04*  CALCIUM  8.7* 8.6* 8.9  PHOS  --   --  5.3*   CBC Recent Labs  Lab 01/16/24 1230 01/17/24 0246 01/17/24 0910 01/18/24 0832  WBC 5.5 5.8  --   --   NEUTROABS 3.7  --   --   --   HGB 7.0* 8.3* 7.0* 7.9*  HCT 22.4* 25.4* 21.2* 24.3*  MCV 95.3 89.4  --   --   PLT 168 143*  --   --     Medications:     sodium chloride    Intravenous Once   Chlorhexidine  Gluconate Cloth  6 each Topical Daily   cyanocobalamin  1,000 mcg Subcutaneous Daily   darbepoetin (ARANESP ) injection - DIALYSIS  60 mcg Subcutaneous Q Fri-1800   pantoprazole  (PROTONIX ) IV  40 mg Intravenous Q12H   sodium chloride  flush  10-40 mL Intracatheter Q12H    Almarie Bonine, MD 01/18/2024, 10:21 AM

## 2024-01-18 NOTE — Progress Notes (Signed)
 Pt receives out-pt HD at [facility], MWF, 0600 chair time. Will continue to assist as needed.   Lavanda Georgena Weisheit Dialysis Navigator [phone]

## 2024-01-18 NOTE — Plan of Care (Signed)
  Problem: Education: Goal: Knowledge of General Education information will improve Description: Including pain rating scale, medication(s)/side effects and non-pharmacologic comfort measures Outcome: Progressing   Problem: Clinical Measurements: Goal: Ability to maintain clinical measurements within normal limits will improve Outcome: Progressing   Problem: Activity: Goal: Risk for activity intolerance will decrease Outcome: Progressing   Problem: Elimination: Goal: Will not experience complications related to bowel motility Outcome: Progressing   Problem: Safety: Goal: Ability to remain free from injury will improve Outcome: Progressing   

## 2024-01-18 NOTE — Progress Notes (Signed)
 PROGRESS NOTE  ELIHUE EBERT FMW:[id number] DOB: [birth year] DOA: 01/16/2024 PCP: [facility] [address]  HPI/Recap of past 24 hours: ERICE AHLES is a 73 y.o. 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 Patient also reporting chronic back pain. In the ED, pt was found to be borderline hypotensive. CBC was obtained that showed hemoglobin of 7, baseline appears to be around 9-10. Hemoccult was positive. Chest x-ray with no acute findings. GI was consulted.  Patient admitted for further management.    Today, saw patient at HD, denies any new complaints.   Assessment/Plan: Principal Problem:   Symptomatic anemia Active Problems:   Hyperlipidemia LDL goal <70   Essential hypertension   CAD (coronary artery disease) nonobstructive per cath 2012   ESRD (end stage renal disease) (HCC)   Type II diabetes mellitus (HCC)   GI bleeding   Symptomatic anemia Normocytic anemia Possible GI bleeding likely 2/2 duodenitis versus rule out duodenal malignancy Hemoglobin dropped to 7, baseline around 9-10 CT abdomen/pelvis/chest showed chronic bronchitic changes in the lungs, cholelithiasis without evidence of acute cholecystitis, sclerotic bone lesions demonstrated in the sternum and L3 with mild compression of L3 could be healing fractures or metastatic pathologic fractures.  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, no evidence of malignancy, however definitive submucosal tissue is not present if a submucosal lesion is suspected GI on board, if hemoglobin remains stable can be discharged on 10/11 with very close outpatient follow-up for possible duodenoscope to allow  better visualization of the lesion and possible resection Type and screen done Trend CBC  Vitamin B12 deficiency Vitamin B12-->less than 150 Start B12 [place] daily x 5 days, plan to discharge patient on oral vitamin B12  Thrombocytopenia Daily CBC  Mild troponin Mildly elevated troponin, flat trend Denies any left-sided chest pain  ESRD HD on MWF Nephrology on board  CAD s/p CABG in 2018 HLD Hold ASA, Plavix   Diabetes mellitus type 2 A1c 5.9     Estimated body mass index is 25.88 kg/m as calculated from the following:   Height as of this encounter: 5' 9 (1.753 m).   Weight as of this encounter: 79.5 kg.     Code Status: Full  Family Communication: None at bedside  Disposition Plan: Status is: Inpatient Remains inpatient appropriate because: Level of care      Consultants: GI  Procedures: EGD on 10/9  Antimicrobials: None  DVT prophylaxis: SCDs   Objective: Vitals:   01/18/24 1100 01/18/24 1202 01/18/24 1213 01/18/24 1444  BP: (!) 112/91 (!) 118/58 119/61 (!) 116/52  Pulse: 88 93 95 85  Resp: 13 15 13 16   Temp:   97.7 F (36.5 C) 98 F (36.7 C)  TempSrc:    Oral  SpO2: 100% 100% 100% 100%  Weight:   79.5 kg   Height:        Intake/Output Summary (Last 24 hours) at 01/18/2024 1607 Last data filed at 01/18/2024 1213 Gross per 24 hour  Intake --  Output 1600 ml  Net -1600 ml   Filed Weights   01/17/24 1126 01/18/24 0828 01/18/24 1213  Weight: 78.9 kg 81.7 kg 79.5 kg    Exam: General: NAD  Cardiovascular: S1, S2 present Respiratory: CTAB  Abdomen: Soft, nontender, nondistended, bowel sounds present Musculoskeletal: No bilateral pedal edema noted Skin: Normal Psychiatry: Normal mood     Data Reviewed: CBC: Recent Labs  Lab 01/16/24 1230 01/17/24 0246 01/17/24 0910 01/18/24 0832 01/18/24 1413  WBC 5.5 5.8  --   --   --   NEUTROABS 3.7  --   --   --   --   HGB 7.0* 8.3* 7.0* 7.9* 8.2*  HCT 22.4* 25.4* 21.2* 24.3* 24.5*   MCV 95.3 89.4  --   --   --   PLT 168 143*  --   --   --    Basic Metabolic Panel: Recent Labs  Lab 01/16/24 1230 01/17/24 0246 01/18/24 0832  NA 137 135 135  K 3.4* 3.9 4.1  CL 94* 94* 92*  CO2 30 26 27   GLUCOSE 139* 113* 90  BUN 32* 46* 67*  CREATININE 5.07* 6.65* 9.04*  CALCIUM  8.7* 8.6* 8.9  PHOS  --   --  5.3*   GFR: Estimated Creatinine Clearance: 7.2 mL/min (A) (by C-G formula based on SCr of 9.04 mg/dL (H)). Liver Function Tests: Recent Labs  Lab 01/16/24 1230 01/18/24 0832  AST 12*  --   ALT 9  --   ALKPHOS 52  --   BILITOT 0.3  --   PROT 5.6*  --   ALBUMIN  2.7* 2.7*   No results for input(s): LIPASE, AMYLASE in the last 168 hours. No results for input(s): AMMONIA in the last 168 hours. Coagulation Profile: No results for input(s): INR, PROTIME in the last 168 hours. Cardiac Enzymes: No results for input(s): CKTOTAL, CKMB, CKMBINDEX, TROPONINI in the last 168 hours. BNP (last 3 results) No results for input(s): PROBNP in the last 8760 hours. HbA1C: Recent Labs    01/18/24 0832  HGBA1C 5.9*   CBG: Recent Labs  Lab 01/17/24 1142 01/17/24 1618  GLUCAP 71 138*   Lipid Profile: No results for input(s): CHOL, HDL, LDLCALC, TRIG, CHOLHDL, LDLDIRECT in the last 72 hours. Thyroid Function Tests: No results for input(s): TSH, T4TOTAL, FREET4, T3FREE, THYROIDAB in the last 72 hours. Anemia Panel: Recent Labs    01/17/24 0910 01/17/24 1055  VITAMINB12  --  <150*  FOLATE 11.0  --   FERRITIN  --  450*  TIBC  --  203*  IRON  --  46   Urine analysis:    Component Value Date/Time   COLORURINE YELLOW 03/18/2017 1721   APPEARANCEUR CLEAR 03/18/2017 1721   LABSPEC 1.007 03/18/2017 1721   PHURINE 9.0 (H) 03/18/2017 1721   GLUCOSEU 150 (A) 03/18/2017 1721   HGBUR NEGATIVE 03/18/2017 1721   BILIRUBINUR NEGATIVE 03/18/2017 1721   KETONESUR NEGATIVE 03/18/2017 1721   PROTEINUR >=300 (A) 03/18/2017 1721    UROBILINOGEN 0.2 06/18/2014 2308   NITRITE NEGATIVE 03/18/2017 1721   LEUKOCYTESUR NEGATIVE 03/18/2017 1721   Sepsis Labs: @LABRCNTIP (procalcitonin:4,lacticidven:4)  )No results found for this or any previous visit (from the past 240 hours).    Studies: CT CHEST ABDOMEN PELVIS WO CONTRAST Result Date: 01/17/2024 CLINICAL DATA:  Possible duodenal mass found on EGD. No contrast material due to allergy. EXAM: CT CHEST, ABDOMEN AND PELVIS WITHOUT CONTRAST TECHNIQUE: Multidetector CT imaging of the chest, abdomen and pelvis was performed following the standard protocol without IV contrast. RADIATION DOSE REDUCTION: This exam was performed according to the departmental dose-optimization program which includes automated exposure control, adjustment of the mA and/or kV according to patient size and/or use of iterative reconstruction technique. COMPARISON:  Chest radiograph 01/16/2024. CT chest 11/05/2023. CT abdomen and pelvis 11/15/2021 FINDINGS: CT CHEST FINDINGS Cardiovascular: Cardiac enlargement. Calcification in the mitral valve annulus, aortic valve, aortic, and coronary arteries. No aortic aneurysm. Mediastinum/Nodes: Esophagus is decompressed. No significant lymphadenopathy. Thyroid gland is unremarkable. Incidental note of a vascular graft in the right arm, likely dialysis graft. Lungs/Pleura: Small bilateral pleural effusions with basilar atelectasis, greater on the left. Bilateral basilar bronchiectasis with bronchial wall thickening and interstitial fibrosis likely indicating chronic bronchitis. No pneumothorax. Musculoskeletal: Degenerative changes in the spine. Deformity with focal sclerosis in the mid sternum. This is similar to prior study and could represent healing fracture or possibly a metastatic lesion. Ribs appear intact CT ABDOMEN PELVIS FINDINGS Hepatobiliary: Cholelithiasis. No focal liver lesion. No bile duct dilatation. Pancreas: Unremarkable. No pancreatic ductal dilatation or  surrounding inflammatory changes. Spleen: Normal in size without focal abnormality. Adrenals/Urinary Tract: 1.4 cm diameter left adrenal gland nodule with Hounsfield unit measurements of -7 consistent with fat containing adenoma. No change since prior study. No imaging follow-up is indicated. Bilateral renal parenchymal atrophy with subcentimeter cysts. No change. No imaging follow-up indicated. No hydronephrosis or hydroureter. Bladder is decompressed. Stomach/Bowel: Stomach is within normal limits. Appendix appears normal. No evidence of bowel wall thickening, distention, or inflammatory changes. Evaluation for focal lesion is limited without oral contrast material but no definitive duodenal mass lesion is identified. There is a large duodenal diverticulum at the junction of the third and fourth portions of the duodenum. Possibly this could account for the lesion identified at endoscopy. Vascular/Lymphatic: Aortic atherosclerosis. No enlarged abdominal or pelvic lymph nodes. Reproductive: Prostate gland is enlarged, measuring 5.6 cm diameter. Other: No free air or free fluid in the abdomen. Surgical clips consistent with mesh hernia repair in the right groin. No residual recurrent hernia. Musculoskeletal: Postoperative fixation from L4 to the sacrum. Degenerative changes in the lumbar spine. Sclerosis and compression of the L3 vertebra, progressed since the prior lumbar spine MRI from 07/22/2022. This is possibly a pathologic fracture with metastatic lesion. IMPRESSION: 1. Chronic bronchitic changes in the lungs. Bilateral pleural effusions with basilar atelectasis, greater on the left. 2. Aortic atherosclerosis. 3. Cholelithiasis without evidence of acute cholecystitis. 4. Bilateral renal parenchymal atrophy. 5. Enlarged prostate gland. 6. Sclerotic bone lesions demonstrated in the sternum and L3 with mild compression of L3. These could represent healing fractures or metastatic pathologic fractures. Consider PSA  correlation. 7. No definitive duodenal mass lesion identified. 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 Electronically Signed   By: Elsie Gravely M.D.   On: 01/17/2024 20:50    Scheduled Meds:  sodium chloride    Intravenous Once   Chlorhexidine  Gluconate Cloth  6 each Topical Daily   cyanocobalamin  1,000 mcg Subcutaneous Daily   darbepoetin (ARANESP ) injection - DIALYSIS  60 mcg Subcutaneous Q Fri-1800   pantoprazole  (PROTONIX ) IV  40 mg Intravenous Q12H   sodium chloride  flush  10-40 mL Intracatheter Q12H    Continuous Infusions:   LOS: 2 days     Lebron JINNY Cage, MD [facility]  If 7PM-7AM, please contact night-coverage [email or web address] 01/18/2024, 4:07 PM

## 2024-01-18 NOTE — Progress Notes (Signed)
 Subjective: No complaints.  No reports of melena.  Objective: Vital signs in last 24 hours: Temp:  [97.4 F (36.3 C)-98.5 F (36.9 C)] 97.7 F (36.5 C) (10/10 1213) Pulse Rate:  [70-95] 95 (10/10 1213) Resp:  [0-32] 13 (10/10 1213) BP: (92-150)/(48-91) 119/61 (10/10 1213) SpO2:  [95 %-100 %] 100 % (10/10 1213) Weight:  [79.5 kg-81.7 kg] 79.5 kg (10/10 1213) Last BM Date : 01/16/24  Intake/Output from previous day: 10/09 0701 - 10/10 0700 In: 127.1 [I.V.:127.1] Out: -  Intake/Output this shift: Total I/O In: -  Out: 1600 [Other:1600]  General appearance: alert and no distress GI: soft, non-tender; bowel sounds normal; no masses,  no organomegaly  Lab Results: Recent Labs    01/16/24 1230 01/17/24 0246 01/17/24 0910 01/18/24 0832  WBC 5.5 5.8  --   --   HGB 7.0* 8.3* 7.0* 7.9*  HCT 22.4* 25.4* 21.2* 24.3*  PLT 168 143*  --   --    BMET Recent Labs    01/16/24 1230 01/17/24 0246 01/18/24 0832  NA 137 135 135  K 3.4* 3.9 4.1  CL 94* 94* 92*  CO2 30 26 27   GLUCOSE 139* 113* 90  BUN 32* 46* 67*  CREATININE 5.07* 6.65* 9.04*  CALCIUM  8.7* 8.6* 8.9   LFT Recent Labs    01/16/24 1230 01/18/24 0832  PROT 5.6*  --   ALBUMIN  2.7* 2.7*  AST 12*  --   ALT 9  --   ALKPHOS 52  --   BILITOT 0.3  --    PT/INR No results for input(s): LABPROT, INR in the last 72 hours. Hepatitis Panel No results for input(s): HEPBSAG, HCVAB, HEPAIGM, HEPBIGM in the last 72 hours. C-Diff No results for input(s): CDIFFTOX in the last 72 hours. Fecal Lactopherrin No results for input(s): FECLLACTOFRN in the last 72 hours.  Studies/Results: CT CHEST ABDOMEN PELVIS WO CONTRAST Result Date: 01/17/2024 CLINICAL DATA:  Possible duodenal mass found on EGD. No contrast material due to allergy. EXAM: CT CHEST, ABDOMEN AND PELVIS WITHOUT CONTRAST TECHNIQUE: Multidetector CT imaging of the chest, abdomen and pelvis was performed following the standard protocol without IV  contrast. RADIATION DOSE REDUCTION: This exam was performed according to the departmental dose-optimization program which includes automated exposure control, adjustment of the mA and/or kV according to patient size and/or use of iterative reconstruction technique. COMPARISON:  Chest radiograph 01/16/2024. CT chest 11/05/2023. CT abdomen and pelvis 11/15/2021 FINDINGS: CT CHEST FINDINGS Cardiovascular: Cardiac enlargement. Calcification in the mitral valve annulus, aortic valve, aortic, and coronary arteries. No aortic aneurysm. Mediastinum/Nodes: Esophagus is decompressed. No significant lymphadenopathy. Thyroid gland is unremarkable. Incidental note of a vascular graft in the right arm, likely dialysis graft. Lungs/Pleura: Small bilateral pleural effusions with basilar atelectasis, greater on the left. Bilateral basilar bronchiectasis with bronchial wall thickening and interstitial fibrosis likely indicating chronic bronchitis. No pneumothorax. Musculoskeletal: Degenerative changes in the spine. Deformity with focal sclerosis in the mid sternum. This is similar to prior study and could represent healing fracture or possibly a metastatic lesion. Ribs appear intact CT ABDOMEN PELVIS FINDINGS Hepatobiliary: Cholelithiasis. No focal liver lesion. No bile duct dilatation. Pancreas: Unremarkable. No pancreatic ductal dilatation or surrounding inflammatory changes. Spleen: Normal in size without focal abnormality. Adrenals/Urinary Tract: 1.4 cm diameter left adrenal gland nodule with Hounsfield unit measurements of -7 consistent with fat containing adenoma. No change since prior study. No imaging follow-up is indicated. Bilateral renal parenchymal atrophy with subcentimeter cysts. No change. No imaging follow-up indicated. No  hydronephrosis or hydroureter. Bladder is decompressed. Stomach/Bowel: Stomach is within normal limits. Appendix appears normal. No evidence of bowel wall thickening, distention, or inflammatory  changes. Evaluation for focal lesion is limited without oral contrast material but no definitive duodenal mass lesion is identified. There is a large duodenal diverticulum at the junction of the third and fourth portions of the duodenum. Possibly this could account for the lesion identified at endoscopy. Vascular/Lymphatic: Aortic atherosclerosis. No enlarged abdominal or pelvic lymph nodes. Reproductive: Prostate gland is enlarged, measuring 5.6 cm diameter. Other: No free air or free fluid in the abdomen. Surgical clips consistent with mesh hernia repair in the right groin. No residual recurrent hernia. Musculoskeletal: Postoperative fixation from L4 to the sacrum. Degenerative changes in the lumbar spine. Sclerosis and compression of the L3 vertebra, progressed since the prior lumbar spine MRI from 07/22/2022. This is possibly a pathologic fracture with metastatic lesion. IMPRESSION: 1. Chronic bronchitic changes in the lungs. Bilateral pleural effusions with basilar atelectasis, greater on the left. 2. Aortic atherosclerosis. 3. Cholelithiasis without evidence of acute cholecystitis. 4. Bilateral renal parenchymal atrophy. 5. Enlarged prostate gland. 6. Sclerotic bone lesions demonstrated in the sternum and L3 with mild compression of L3. These could represent healing fractures or metastatic pathologic fractures. Consider PSA correlation. 7. No definitive duodenal mass lesion identified. 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 Electronically Signed   By: Elsie Gravely M.D.   On: 01/17/2024 20:50    Medications: Scheduled:  sodium chloride    Intravenous Once   Chlorhexidine  Gluconate Cloth  6 each Topical Daily   cyanocobalamin  1,000 mcg Subcutaneous Daily   darbepoetin (ARANESP ) injection - DIALYSIS  60 mcg Subcutaneous Q Fri-1800   pantoprazole  (PROTONIX ) IV  40 mg Intravenous Q12H   sodium chloride  flush  10-40 mL Intracatheter Q12H   Continuous:  Assessment/Plan: 1) Duodenal  lesion. 2) Anemia. 3) ESRD.   The patient's duodenal biopsy result only showed inflammation.  This is a positive piece of news, but there is still concern that it may be sampling error.  He does not report any further melena and his HGB is stable at 7.9 g/dL.  IF his HGB remains stable, he can be discharged home and evaluated as an outpatient very soon.  A duodenoscope (side viewing scope) will allow better visualization of the lesion and possible resection, if it is truly benign.  Plan: 1) Follow HGB. 2) If stable tomorrow he can be discharged home with rapid follow up in the office.   LOS: 2 days   Cella Cappello D 01/18/2024, 12:41 PM

## 2024-01-18 NOTE — Procedures (Signed)
 Patient seen and examined on Hemodialysis. The procedure was supervised and I have made appropriate changes. BP 118/65   Pulse 89   Temp 98.5 F (36.9 C)   Resp 18   Ht 5' 9 (1.753 m)   Wt 81.7 kg   SpO2 100%   BMI 26.60 kg/m   QB 400 mL/ min, UF goal 2L  Tolerating treatment without complaints at this time.   Almarie Bonine MD [facility] [phone] 10:27 AM

## 2024-01-18 NOTE — Procedures (Signed)
 HD Note:  Some information was entered later than the data was gathered due to patient care needs. The stated time with the data is accurate.  Received patient in bed to unit.   Alert and oriented.   Informed consent signed and in chart.   Access used: right upper arm graft Access issues: no issues  Patient tolerated treatment well.   TX duration: 3 hours.  Patient treatment stopped due to clotting with the approval of Ronnald Acosta, PA  Alert, without acute distress.  Total UF removed: 1600 ml  Hand-off given to patient's nurse.   Transported back to the room   Linn Goetze L. Lenon, RN [facility].

## 2024-01-18 NOTE — H&P (View-Only) (Signed)
 Subjective: No complaints.  No reports of melena.  Objective: Vital signs in last 24 hours: Temp:  [97.4 F (36.3 C)-98.5 F (36.9 C)] 97.7 F (36.5 C) (10/10 1213) Pulse Rate:  [70-95] 95 (10/10 1213) Resp:  [0-32] 13 (10/10 1213) BP: (92-150)/(48-91) 119/61 (10/10 1213) SpO2:  [95 %-100 %] 100 % (10/10 1213) Weight:  [79.5 kg-81.7 kg] 79.5 kg (10/10 1213) Last BM Date : 01/16/24  Intake/Output from previous day: 10/09 0701 - 10/10 0700 In: 127.1 [I.V.:127.1] Out: -  Intake/Output this shift: Total I/O In: -  Out: 1600 [Other:1600]  General appearance: alert and no distress GI: soft, non-tender; bowel sounds normal; no masses,  no organomegaly  Lab Results: Recent Labs    01/16/24 1230 01/17/24 0246 01/17/24 0910 01/18/24 0832  WBC 5.5 5.8  --   --   HGB 7.0* 8.3* 7.0* 7.9*  HCT 22.4* 25.4* 21.2* 24.3*  PLT 168 143*  --   --    BMET Recent Labs    01/16/24 1230 01/17/24 0246 01/18/24 0832  NA 137 135 135  K 3.4* 3.9 4.1  CL 94* 94* 92*  CO2 30 26 27   GLUCOSE 139* 113* 90  BUN 32* 46* 67*  CREATININE 5.07* 6.65* 9.04*  CALCIUM  8.7* 8.6* 8.9   LFT Recent Labs    01/16/24 1230 01/18/24 0832  PROT 5.6*  --   ALBUMIN  2.7* 2.7*  AST 12*  --   ALT 9  --   ALKPHOS 52  --   BILITOT 0.3  --    PT/INR No results for input(s): LABPROT, INR in the last 72 hours. Hepatitis Panel No results for input(s): HEPBSAG, HCVAB, HEPAIGM, HEPBIGM in the last 72 hours. C-Diff No results for input(s): CDIFFTOX in the last 72 hours. Fecal Lactopherrin No results for input(s): FECLLACTOFRN in the last 72 hours.  Studies/Results: CT CHEST ABDOMEN PELVIS WO CONTRAST Result Date: 01/17/2024 CLINICAL DATA:  Possible duodenal mass found on EGD. No contrast material due to allergy. EXAM: CT CHEST, ABDOMEN AND PELVIS WITHOUT CONTRAST TECHNIQUE: Multidetector CT imaging of the chest, abdomen and pelvis was performed following the standard protocol without IV  contrast. RADIATION DOSE REDUCTION: This exam was performed according to the departmental dose-optimization program which includes automated exposure control, adjustment of the mA and/or kV according to patient size and/or use of iterative reconstruction technique. COMPARISON:  Chest radiograph 01/16/2024. CT chest 11/05/2023. CT abdomen and pelvis 11/15/2021 FINDINGS: CT CHEST FINDINGS Cardiovascular: Cardiac enlargement. Calcification in the mitral valve annulus, aortic valve, aortic, and coronary arteries. No aortic aneurysm. Mediastinum/Nodes: Esophagus is decompressed. No significant lymphadenopathy. Thyroid gland is unremarkable. Incidental note of a vascular graft in the right arm, likely dialysis graft. Lungs/Pleura: Small bilateral pleural effusions with basilar atelectasis, greater on the left. Bilateral basilar bronchiectasis with bronchial wall thickening and interstitial fibrosis likely indicating chronic bronchitis. No pneumothorax. Musculoskeletal: Degenerative changes in the spine. Deformity with focal sclerosis in the mid sternum. This is similar to prior study and could represent healing fracture or possibly a metastatic lesion. Ribs appear intact CT ABDOMEN PELVIS FINDINGS Hepatobiliary: Cholelithiasis. No focal liver lesion. No bile duct dilatation. Pancreas: Unremarkable. No pancreatic ductal dilatation or surrounding inflammatory changes. Spleen: Normal in size without focal abnormality. Adrenals/Urinary Tract: 1.4 cm diameter left adrenal gland nodule with Hounsfield unit measurements of -7 consistent with fat containing adenoma. No change since prior study. No imaging follow-up is indicated. Bilateral renal parenchymal atrophy with subcentimeter cysts. No change. No imaging follow-up indicated. No  hydronephrosis or hydroureter. Bladder is decompressed. Stomach/Bowel: Stomach is within normal limits. Appendix appears normal. No evidence of bowel wall thickening, distention, or inflammatory  changes. Evaluation for focal lesion is limited without oral contrast material but no definitive duodenal mass lesion is identified. There is a large duodenal diverticulum at the junction of the third and fourth portions of the duodenum. Possibly this could account for the lesion identified at endoscopy. Vascular/Lymphatic: Aortic atherosclerosis. No enlarged abdominal or pelvic lymph nodes. Reproductive: Prostate gland is enlarged, measuring 5.6 cm diameter. Other: No free air or free fluid in the abdomen. Surgical clips consistent with mesh hernia repair in the right groin. No residual recurrent hernia. Musculoskeletal: Postoperative fixation from L4 to the sacrum. Degenerative changes in the lumbar spine. Sclerosis and compression of the L3 vertebra, progressed since the prior lumbar spine MRI from 07/22/2022. This is possibly a pathologic fracture with metastatic lesion. IMPRESSION: 1. Chronic bronchitic changes in the lungs. Bilateral pleural effusions with basilar atelectasis, greater on the left. 2. Aortic atherosclerosis. 3. Cholelithiasis without evidence of acute cholecystitis. 4. Bilateral renal parenchymal atrophy. 5. Enlarged prostate gland. 6. Sclerotic bone lesions demonstrated in the sternum and L3 with mild compression of L3. These could represent healing fractures or metastatic pathologic fractures. Consider PSA correlation. 7. No definitive duodenal mass lesion identified. 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 Electronically Signed   By: Elsie Gravely M.D.   On: 01/17/2024 20:50    Medications: Scheduled:  sodium chloride    Intravenous Once   Chlorhexidine  Gluconate Cloth  6 each Topical Daily   cyanocobalamin  1,000 mcg Subcutaneous Daily   darbepoetin (ARANESP ) injection - DIALYSIS  60 mcg Subcutaneous Q Fri-1800   pantoprazole  (PROTONIX ) IV  40 mg Intravenous Q12H   sodium chloride  flush  10-40 mL Intracatheter Q12H   Continuous:  Assessment/Plan: 1) Duodenal  lesion. 2) Anemia. 3) ESRD.   The patient's duodenal biopsy result only showed inflammation.  This is a positive piece of news, but there is still concern that it may be sampling error.  He does not report any further melena and his HGB is stable at 7.9 g/dL.  IF his HGB remains stable, he can be discharged home and evaluated as an outpatient very soon.  A duodenoscope (side viewing scope) will allow better visualization of the lesion and possible resection, if it is truly benign.  Plan: 1) Follow HGB. 2) If stable tomorrow he can be discharged home with rapid follow up in the office.   LOS: 2 days   Cella Cappello D 01/18/2024, 12:41 PM

## 2024-01-19 ENCOUNTER — Encounter (HOSPITAL_COMMUNITY): Payer: Self-pay | Admitting: *Deleted

## 2024-01-19 ENCOUNTER — Other Ambulatory Visit (HOSPITAL_COMMUNITY): Payer: Self-pay

## 2024-01-19 DIAGNOSIS — D649 Anemia, unspecified: Secondary | ICD-10-CM | POA: Diagnosis not present

## 2024-01-19 DIAGNOSIS — Z7902 Long term (current) use of antithrombotics/antiplatelets: Secondary | ICD-10-CM

## 2024-01-19 DIAGNOSIS — K3189 Other diseases of stomach and duodenum: Secondary | ICD-10-CM

## 2024-01-19 LAB — RENAL FUNCTION PANEL
Albumin: 2.6 g/dL — ABNORMAL LOW (ref 3.5–5.0)
Anion gap: 11 (ref 5–15)
BUN: 52 mg/dL — ABNORMAL HIGH (ref 8–23)
CO2: 26 mmol/L (ref 22–32)
Calcium: 8.9 mg/dL (ref 8.9–10.3)
Chloride: 95 mmol/L — ABNORMAL LOW (ref 98–111)
Creatinine, Ser: 7.37 mg/dL — ABNORMAL HIGH (ref 0.61–1.24)
GFR, Estimated: 7 mL/min — ABNORMAL LOW (ref >60)
Glucose, Bld: 101 mg/dL — ABNORMAL HIGH (ref 70–99)
Phosphorus: 4.7 mg/dL — ABNORMAL HIGH (ref 2.5–4.6)
Potassium: 4.2 mmol/L (ref 3.5–5.1)
Sodium: 132 mmol/L — ABNORMAL LOW (ref 135–145)

## 2024-01-19 LAB — CBC
HCT: 25.3 % — ABNORMAL LOW (ref 39.0–52.0)
Hemoglobin: 8.3 g/dL — ABNORMAL LOW (ref 13.0–17.0)
MCH: 29.7 pg (ref 26.0–34.0)
MCHC: 32.8 g/dL (ref 30.0–36.0)
MCV: 90.7 fL (ref 80.0–100.0)
Platelets: 171 K/uL (ref 150–400)
RBC: 2.79 MIL/uL — ABNORMAL LOW (ref 4.22–5.81)
RDW: 15.2 % (ref 11.5–15.5)
WBC: 6.3 K/uL (ref 4.0–10.5)
nRBC: 0 % (ref 0.0–0.2)

## 2024-01-19 MED ORDER — PANTOPRAZOLE SODIUM 40 MG PO TBEC
40.0000 mg | DELAYED_RELEASE_TABLET | Freq: Two times a day (BID) | ORAL | 0 refills | Status: AC
  Filled 2024-01-19: qty 60, 30d supply, fill #0

## 2024-01-19 MED ORDER — VITAMIN B-12 1000 MCG PO TABS
1000.0000 ug | ORAL_TABLET | Freq: Every day | ORAL | 0 refills | Status: AC
Start: 2024-01-19 — End: 2024-02-18
  Filled 2024-01-19: qty 30, 30d supply, fill #0

## 2024-01-19 NOTE — TOC Transition Note (Signed)
 Transition of Care [facility]) - Discharge Note   Patient Details  Name: NEFTALI ABAIR MRN: [id number] Date of Birth: Jul 21, 1950  [facility]) CM/SW Contact:  Robynn Eileen Hoose, RN Phone Number: 01/19/2024, 1:27 PM   Clinical Narrative:   Patient is being discharged today. Per floor nurse, pt oxygen is 98% on room air.          Patient Goals and CMS Choice            Discharge Placement                       Discharge Plan and Services Additional resources added to the After Visit Summary for                                       Social Drivers of Health (SDOH) Interventions SDOH Screenings   Food Insecurity: Food Insecurity Present (01/16/2024)  Housing: Low Risk  (01/16/2024)  Transportation Needs: No Transportation Needs (01/16/2024)  Utilities: Not At Risk (01/16/2024)  Depression (PHQ2-9): Medium Risk (02/03/2020)  Social Connections: Moderately Integrated (01/16/2024)  Tobacco Use: Medium Risk (01/17/2024)     Readmission Risk Interventions     No data to display

## 2024-01-19 NOTE — Discharge Planning (Signed)
 [facility] Patient Discharge Orders - [facility] CLINIC: [facility]  Patient's name: Jon Anderson Admit/DC Dates: 01/16/2024 - 01/19/24  DISCHARGE DIAGNOSES: GI bleed/melena Duodenal mass, polyp v. malignancy. S/p Bx 10/9 which was negative, but they remain concerned. L arm edema  HD ORDER CHANGES: Heparin  change: YES - hold heparin  x 2 weeks, then re-evaluate if needed EDW Change: no  [place] Change: no  ANEMIA MANAGEMENT: Aranesp : Given: no   ESA dose for discharge: Per protocol --- see below note. IV Iron dose at discharge: Per protocol  Transfusion: Given: Yes - 2U PRBCs this admit  BONE/MINERAL MEDICATIONS: Hectorol/Calcitriol  change: no Sensipar /Parsabiv change: no  ACCESS INTERVENTION/CHANGE: no Details:   RECENT LABS: Recent Labs  Lab 01/19/24 0748  HGB 8.3*  NA 132*  K 4.2  CALCIUM  8.9  PHOS 4.7*  ALBUMIN  2.6*    IV ANTIBIOTICS: no Details:  OTHER ANTICOAGULATION: no Details:  OTHER/APPTS/LAB ORDERS: - S/p Bx of duodenal mass - benign - BUT GI not convinced. Plan is f/u with Dr. Rollin, repeat scope/Biopsy - In light of the above, would be cautious with ESA  use. - VVS saw him for L arm edema, recommended to have fistulogram scheduled as outpatient -> pls do this. - Plavix  also on hold per GI - ok to remain on aspirin    D/C Meds to be reconciled by nurse after every discharge.  Completed By: Izetta Boehringer, PA-C [facility] Pager [phone]   Reviewed by: MD:______ RN_______

## 2024-01-19 NOTE — Progress Notes (Addendum)
 Progress Note   Subjective  Doing well, he wants to go home. Having PT/OT evaluation now. Hgb stable, no bleeding.   Objective   Vital signs in last 24 hours: Temp:  [97.7 F (36.5 C)-98.3 F (36.8 C)] 98.3 F (36.8 C) (10/11 0808) Pulse Rate:  [71-95] 71 (10/11 0808) Resp:  [13-18] 18 (10/11 0808) BP: (116-133)/(52-72) 133/65 (10/11 0808) SpO2:  [97 %-100 %] 97 % (10/11 0808) Weight:  [79.5 kg] 79.5 kg (10/10 1213) Last BM Date : 01/16/24 General:    AA male in NAD Neurologic:  Alert and oriented,  grossly normal neurologically. Psych:  Cooperative. Normal mood and affect.  Intake/Output from previous day: 10/10 0701 - 10/11 0700 In: -  Out: 1600  Intake/Output this shift: No intake/output data recorded.  Lab Results: Recent Labs    01/16/24 1230 01/17/24 0246 01/17/24 0910 01/18/24 1413 01/18/24 1953 01/19/24 0748  WBC 5.5 5.8  --   --   --  6.3  HGB 7.0* 8.3*   < > 8.2* 7.9* 8.3*  HCT 22.4* 25.4*   < > 24.5* 24.8* 25.3*  PLT 168 143*  --   --   --  171   < > = values in this interval not displayed.   BMET Recent Labs    01/17/24 0246 01/18/24 0832 01/19/24 0748  NA 135 135 132*  K 3.9 4.1 4.2  CL 94* 92* 95*  CO2 26 27 26   GLUCOSE 113* 90 101*  BUN 46* 67* 52*  CREATININE 6.65* 9.04* 7.37*  CALCIUM  8.6* 8.9 8.9   LFT Recent Labs    01/16/24 1230 01/18/24 0832 01/19/24 0748  PROT 5.6*  --   --   ALBUMIN  2.7*   < > 2.6*  AST 12*  --   --   ALT 9  --   --   ALKPHOS 52  --   --   BILITOT 0.3  --   --    < > = values in this interval not displayed.   PT/INR No results for input(s): LABPROT, INR in the last 72 hours.  Studies/Results: CT CHEST ABDOMEN PELVIS WO CONTRAST Result Date: 01/17/2024 CLINICAL DATA:  Possible duodenal mass found on EGD. No contrast material due to allergy. EXAM: CT CHEST, ABDOMEN AND PELVIS WITHOUT CONTRAST TECHNIQUE: Multidetector CT imaging of the chest, abdomen and pelvis was performed following the  standard protocol without IV contrast. RADIATION DOSE REDUCTION: This exam was performed according to the departmental dose-optimization program which includes automated exposure control, adjustment of the mA and/or kV according to patient size and/or use of iterative reconstruction technique. COMPARISON:  Chest radiograph 01/16/2024. CT chest 11/05/2023. CT abdomen and pelvis 11/15/2021 FINDINGS: CT CHEST FINDINGS Cardiovascular: Cardiac enlargement. Calcification in the mitral valve annulus, aortic valve, aortic, and coronary arteries. No aortic aneurysm. Mediastinum/Nodes: Esophagus is decompressed. No significant lymphadenopathy. Thyroid gland is unremarkable. Incidental note of a vascular graft in the right arm, likely dialysis graft. Lungs/Pleura: Small bilateral pleural effusions with basilar atelectasis, greater on the left. Bilateral basilar bronchiectasis with bronchial wall thickening and interstitial fibrosis likely indicating chronic bronchitis. No pneumothorax. Musculoskeletal: Degenerative changes in the spine. Deformity with focal sclerosis in the mid sternum. This is similar to prior study and could represent healing fracture or possibly a metastatic lesion. Ribs appear intact CT ABDOMEN PELVIS FINDINGS Hepatobiliary: Cholelithiasis. No focal liver lesion. No bile duct dilatation. Pancreas: Unremarkable. No pancreatic ductal dilatation or surrounding inflammatory changes. Spleen: Normal in  size without focal abnormality. Adrenals/Urinary Tract: 1.4 cm diameter left adrenal gland nodule with Hounsfield unit measurements of -7 consistent with fat containing adenoma. No change since prior study. No imaging follow-up is indicated. Bilateral renal parenchymal atrophy with subcentimeter cysts. No change. No imaging follow-up indicated. No hydronephrosis or hydroureter. Bladder is decompressed. Stomach/Bowel: Stomach is within normal limits. Appendix appears normal. No evidence of bowel wall thickening,  distention, or inflammatory changes. Evaluation for focal lesion is limited without oral contrast material but no definitive duodenal mass lesion is identified. There is a large duodenal diverticulum at the junction of the third and fourth portions of the duodenum. Possibly this could account for the lesion identified at endoscopy. Vascular/Lymphatic: Aortic atherosclerosis. No enlarged abdominal or pelvic lymph nodes. Reproductive: Prostate gland is enlarged, measuring 5.6 cm diameter. Other: No free air or free fluid in the abdomen. Surgical clips consistent with mesh hernia repair in the right groin. No residual recurrent hernia. Musculoskeletal: Postoperative fixation from L4 to the sacrum. Degenerative changes in the lumbar spine. Sclerosis and compression of the L3 vertebra, progressed since the prior lumbar spine MRI from 07/22/2022. This is possibly a pathologic fracture with metastatic lesion. IMPRESSION: 1. Chronic bronchitic changes in the lungs. Bilateral pleural effusions with basilar atelectasis, greater on the left. 2. Aortic atherosclerosis. 3. Cholelithiasis without evidence of acute cholecystitis. 4. Bilateral renal parenchymal atrophy. 5. Enlarged prostate gland. 6. Sclerotic bone lesions demonstrated in the sternum and L3 with mild compression of L3. These could represent healing fractures or metastatic pathologic fractures. Consider PSA correlation. 7. No definitive duodenal mass lesion identified. 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 Electronically Signed   By: Elsie Gravely M.D.   On: 01/17/2024 20:50       Assessment / Plan:    73 y/o male here with the following:  Upper GI bleed - secondary to duodenal polypoid lesion Post hemorrhagic anemia ESRD Antiplatelet use  No further bleeding, Hgb stable. I think can go home from a bleeding perspective. He does take aspirin  / PLavix  at baseline. He is at risk for rebleeding given he has not had definitive treatment of  duodenal lesion, especially in the setting of Plavix . I think would hold Plavix  for now but could resume baby aspirin . He will need very close follow up with Dr. Rollin, likely repeat EGD with duodenoscope and additional biopsies in the near future. Dr. Rollin can coordinate that. Otherwise he should be discharged on protonix  40mg  BID and avoid all NSAIDs, should have a Hgb checked early next week to make sure stable. I will sign this out to Dr. Rollin.  Call with questions.  Marcey Naval, MD [facility]

## 2024-01-19 NOTE — Progress Notes (Signed)
 [facility] Progress Note   Subjective:   Seen in room - feels ok today, no CP/dyspnea. HD went fine yesterday. Reviewed GI note, duodenal Bx appears benign, but concern for sampling error, will need outpatient f/u.   Objective Vitals:   01/18/24 1444 01/18/24 2007 01/19/24 0414 01/19/24 0808  BP: (!) 116/52 121/64 131/72 133/65  Pulse: 85 71 76 71  Resp: 16 16 13 18   Temp: 98 F (36.7 C) 98 F (36.7 C) 98.1 F (36.7 C) 98.3 F (36.8 C)  TempSrc: Oral   Oral  SpO2: 100% 98% 100% 97%  Weight:      Height:       Physical Exam General: Well appearing man, NAD Heart: RRR Lungs: CTAB Abdomen: soft Extremities: no LE edema Dialysis Access: RUE AVF +t/b  Additional Objective Labs: Basic Metabolic Panel: Recent Labs  Lab 01/17/24 0246 01/18/24 0832 01/19/24 0748  NA 135 135 132*  K 3.9 4.1 4.2  CL 94* 92* 95*  CO2 26 27 26   GLUCOSE 113* 90 101*  BUN 46* 67* 52*  CREATININE 6.65* 9.04* 7.37*  CALCIUM  8.6* 8.9 8.9  PHOS  --  5.3* 4.7*   Liver Function Tests: Recent Labs  Lab 01/16/24 1230 01/18/24 0832 01/19/24 0748  AST 12*  --   --   ALT 9  --   --   ALKPHOS 52  --   --   BILITOT 0.3  --   --   PROT 5.6*  --   --   ALBUMIN  2.7* 2.7* 2.6*   CBC: Recent Labs  Lab 01/16/24 1230 01/17/24 0246 01/17/24 0910 01/18/24 1413 01/18/24 1953 01/19/24 0748  WBC 5.5 5.8  --   --   --  6.3  NEUTROABS 3.7  --   --   --   --   --   HGB 7.0* 8.3*   < > 8.2* 7.9* 8.3*  HCT 22.4* 25.4*   < > 24.5* 24.8* 25.3*  MCV 95.3 89.4  --   --   --  90.7  PLT 168 143*  --   --   --  171   < > = values in this interval not displayed.   Studies/Results: CT CHEST ABDOMEN PELVIS WO CONTRAST Result Date: 01/17/2024 CLINICAL DATA:  Possible duodenal mass found on EGD. No contrast material due to allergy. EXAM: CT CHEST, ABDOMEN AND PELVIS WITHOUT CONTRAST TECHNIQUE: Multidetector CT imaging of the chest, abdomen and pelvis was performed following the standard  protocol without IV contrast. RADIATION DOSE REDUCTION: This exam was performed according to the departmental dose-optimization program which includes automated exposure control, adjustment of the mA and/or kV according to patient size and/or use of iterative reconstruction technique. COMPARISON:  Chest radiograph 01/16/2024. CT chest 11/05/2023. CT abdomen and pelvis 11/15/2021 FINDINGS: CT CHEST FINDINGS Cardiovascular: Cardiac enlargement. Calcification in the mitral valve annulus, aortic valve, aortic, and coronary arteries. No aortic aneurysm. Mediastinum/Nodes: Esophagus is decompressed. No significant lymphadenopathy. Thyroid gland is unremarkable. Incidental note of a vascular graft in the right arm, likely dialysis graft. Lungs/Pleura: Small bilateral pleural effusions with basilar atelectasis, greater on the left. Bilateral basilar bronchiectasis with bronchial wall thickening and interstitial fibrosis likely indicating chronic bronchitis. No pneumothorax. Musculoskeletal: Degenerative changes in the spine. Deformity with focal sclerosis in the mid sternum. This is similar to prior study and could represent healing fracture or possibly a metastatic lesion. Ribs appear intact CT ABDOMEN PELVIS FINDINGS Hepatobiliary: Cholelithiasis. No focal liver lesion. No bile  duct dilatation. Pancreas: Unremarkable. No pancreatic ductal dilatation or surrounding inflammatory changes. Spleen: Normal in size without focal abnormality. Adrenals/Urinary Tract: 1.4 cm diameter left adrenal gland nodule with Hounsfield unit measurements of -7 consistent with fat containing adenoma. No change since prior study. No imaging follow-up is indicated. Bilateral renal parenchymal atrophy with subcentimeter cysts. No change. No imaging follow-up indicated. No hydronephrosis or hydroureter. Bladder is decompressed. Stomach/Bowel: Stomach is within normal limits. Appendix appears normal. No evidence of bowel wall thickening, distention,  or inflammatory changes. Evaluation for focal lesion is limited without oral contrast material but no definitive duodenal mass lesion is identified. There is a large duodenal diverticulum at the junction of the third and fourth portions of the duodenum. Possibly this could account for the lesion identified at endoscopy. Vascular/Lymphatic: Aortic atherosclerosis. No enlarged abdominal or pelvic lymph nodes. Reproductive: Prostate gland is enlarged, measuring 5.6 cm diameter. Other: No free air or free fluid in the abdomen. Surgical clips consistent with mesh hernia repair in the right groin. No residual recurrent hernia. Musculoskeletal: Postoperative fixation from L4 to the sacrum. Degenerative changes in the lumbar spine. Sclerosis and compression of the L3 vertebra, progressed since the prior lumbar spine MRI from 07/22/2022. This is possibly a pathologic fracture with metastatic lesion. IMPRESSION: 1. Chronic bronchitic changes in the lungs. Bilateral pleural effusions with basilar atelectasis, greater on the left. 2. Aortic atherosclerosis. 3. Cholelithiasis without evidence of acute cholecystitis. 4. Bilateral renal parenchymal atrophy. 5. Enlarged prostate gland. 6. Sclerotic bone lesions demonstrated in the sternum and L3 with mild compression of L3. These could represent healing fractures or metastatic pathologic fractures. Consider PSA correlation. 7. No definitive duodenal mass lesion identified. 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 Electronically Signed   By: Elsie Gravely M.D.   On: 01/17/2024 20:50    Medications:   sodium chloride    Intravenous Once   Chlorhexidine  Gluconate Cloth  6 each Topical Daily   cyanocobalamin  1,000 mcg Subcutaneous Daily   darbepoetin (ARANESP ) injection - DIALYSIS  60 mcg Subcutaneous Q Fri-1800   pantoprazole  (PROTONIX ) IV  40 mg Intravenous Q12H   sodium chloride  flush  10-40 mL Intracatheter Q12H    Dialysis Orders [facility] MWF 4H BFR 400/1.5x EDW  76.5kg 2K/2Ca AVG  - No ESA orders  - calcitriol  2 mcg q HD    Assessment/Plan: Melena/GIB: S/p 2U PRBCs on admit. GI consulted, underwent EGD with large duodenal mass - concerning for malignancy. Biopsied - path benign, inflammation. However, GI concern for possible sampling error. CT 10/9 showed enlarged prostate and sclerotic bone lesions in sternum and L3. Needs PSA checked. Per primary. ESRD: Continue HD on MWF schedule - next HD Monday 10/13. Access. LUE AVG placed 06/2023; worsening swelling since placement. Seen by VVS, for outpatient fistulogram Hypertension. BP stable, up slightly per weights, UF as tolerated with next HD. Anemia of CKD + GIB: S/p 2U PRBCs here. Hgb improving. Follow without ESA for now. Secondary HPTH: Ca/Phos ok - monitor. Dispo: Ok from renal standpoint once medically cleared.   Izetta Boehringer, PA-C 01/19/2024, 9:35 AM  [employer]

## 2024-01-19 NOTE — Discharge Planning (Signed)
 Patient alert. IV access removed. Discharge teaching given to the patient. Patient verbalized understanding of the teaching including medications. Discharge summary placed in discharge packet and placed with patient belongings. Patient will be transported home via family.

## 2024-01-19 NOTE — Plan of Care (Signed)
  Problem: Education: Goal: Knowledge of General Education information will improve Description: Including pain rating scale, medication(s)/side effects and non-pharmacologic comfort measures Outcome: Progressing   Problem: Clinical Measurements: Goal: Ability to maintain clinical measurements within normal limits will improve Outcome: Progressing Goal: Will remain free from infection Outcome: Progressing   Problem: Activity: Goal: Risk for activity intolerance will decrease Outcome: Progressing   

## 2024-01-19 NOTE — Discharge Summary (Signed)
 Physician Discharge Summary   Patient: Jon Anderson MRN: [id number] DOB: Jul 21, 1950  Admit date:     01/16/2024  Discharge date: 01/19/24  Discharge Physician: Lebron JINNY Cage   PCP: [facility]   Recommendations at discharge:   Follow-up with PCP in 1 week Follow-up with GI, Dr. Rollin in about a week   Discharge Diagnoses: Principal Problem:   Symptomatic anemia Active Problems:   Hyperlipidemia LDL goal <70   Essential hypertension   CAD (coronary artery disease) nonobstructive per cath 2012   ESRD (end stage renal disease) (HCC)   Type II diabetes mellitus (HCC)   Upper GI bleed   Antiplatelet or antithrombotic long-term use    Hospital Course: Jon Anderson is a 73 y.o. 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 Patient also reporting chronic back pain. In the ED, pt was found to be borderline hypotensive. CBC was obtained that showed hemoglobin of 7, baseline appears to be around 9-10. Hemoccult was positive. Chest x-ray with no acute findings. GI was consulted. Patient admitted for further management.    Today, patient denied any new complaints.  Patient was able to ambulate back-and-forth in the bathroom and back with a walker.  Lost his cane in the ED. Hemoglobin remained stable, discharged to follow-up with PCP and Dr. Rollin with GI for repeat labs and further management.    Assessment and Plan:  Symptomatic anemia Normocytic anemia Possible GI bleeding likely 2/2 duodenal polypoid lesion versus rule out duodenal malignancy Hemoglobin dropped to 7, baseline around 9-10 CT abdomen/pelvis/chest showed chronic bronchitic changes in the lungs, cholelithiasis without evidence of acute cholecystitis, sclerotic bone lesions demonstrated in the sternum and L3 with mild compression of L3  could be healing fractures or metastatic pathologic fractures.  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  Follow-up with repeat labs next week and for possible duodenoscope to allow better visualization of the lesion and possible resection  Vitamin B12 deficiency Vitamin B12-->less than 150 S/p vitamin B12 [place] , discharge on PO daily   Mild troponin Mildly elevated troponin, flat trend Denies any left-sided chest pain   ESRD HD on MWF   CAD s/p CABG in 2018 HLD Restart ASA, hold Plavix  pending further eval   Diabetes mellitus type 2 A1c 5.9          Pain control - [name]  Controlled Substance Reporting System database was reviewed. and patient was instructed, not to drive, operate heavy machinery, perform activities at heights, swimming or participation in water  activities or provide baby-sitting services while on Pain, Sleep and Anxiety Medications; until their outpatient Physician has advised to do so again. Also recommended to not to take more than prescribed Pain, Sleep and Anxiety Medications.    Consultants: GI Procedures performed: EGD Disposition: Home Diet recommendation:  Cardiac and Carb modified diet   DISCHARGE MEDICATION: Allergies as of 01/19/2024       Reactions   Contrast Media [iodinated Contrast Media] Anaphylaxis, Hives, Shortness Of Breath, Swelling, Other (See Comments)   Cardiac arrest Throat  swelling   Kiwi Extract Itching, Swelling   Lip, facial swelling - does not affect breathing   Flexeril  [cyclobenzaprine ] Other (See Comments)   Weakness in hands, unable to hold items   Tape Rash    Blistering rash Reaction occurs with plastic tapes        Medication List     PAUSE taking these medications    clopidogrel  75 MG tablet Wait to take this until your doctor or other care provider tells you to start again. Commonly known as: PLAVIX  Take 75 mg by mouth daily.       TAKE these medications    acetaminophen  500 MG tablet Commonly known as: TYLENOL  Take 1,000 mg by mouth 2 (two) times daily as needed for headache or fever (pain).   aspirin  EC 81 MG tablet Take 81 mg by mouth at bedtime.   CALCITRIOL  PO Take 2 mcg by mouth every Monday, Wednesday, and Friday with hemodialysis. Provided and administered by dialysis clinic   calcium  acetate 667 MG capsule Commonly known as: PHOSLO  Take 2 capsules (1,334 mg total) by mouth 2 (two) times daily with a meal. What changed: when to take this   cyanocobalamin 1000 MCG tablet Commonly known as: VITAMIN B12 Take 1 tablet (1,000 mcg total) by mouth daily.   gabapentin  100 MG capsule Commonly known as: NEURONTIN  Take 100-200 mg by mouth See admin instructions. Take 1 capsule (100mg ) by mouth every morning and take 2 capsules (200mg ) at bedtime.   HYDROcodone -acetaminophen  7.5-325 MG tablet Commonly known as: NORCO Take 1 tablet by mouth every 8 (eight) hours as needed for severe pain (pain score 7-10).   Mircera 50 MCG/0.3ML [place]os[place] Generic drug: Methoxy PEG-Epoetin Beta Inject 50 mcg into the vein every 14 (fourteen) days. Provided and administered by dialysis clinic   pantoprazole  40 MG tablet Commonly known as: PROTONIX  Take 1 tablet (40 mg total) by mouth 2 (two) times daily. What changed: when to take this        Follow-up Information     [facility]. Schedule an appointment as soon as possible for a visit in 1 week(s).   Contact information: [address] [place] [address] [phone]         Rollin Dover, MD. Schedule an appointment as soon as possible for a visit in 1 week(s).    Specialty: Gastroenterology Contact information: [address] [place] [address] [phone]                Discharge Exam: [name]   01/17/24 1126 01/18/24 0828 01/18/24 1213  Weight: 78.9 kg 81.7 kg 79.5 kg   General: NAD  Cardiovascular: S1, S2 present Respiratory: CTAB Abdomen: Soft, nontender, nondistended, bowel sounds present Musculoskeletal: No bilateral pedal edema noted Skin: Normal Psychiatry: Normal mood   Condition at discharge: stable  The results of significant diagnostics from this hospitalization (including imaging, microbiology, ancillary and laboratory) are listed below for reference.   Imaging Studies: CT CHEST ABDOMEN PELVIS WO CONTRAST Result Date: 01/17/2024 CLINICAL DATA:  Possible duodenal mass found on EGD. No contrast material due to allergy. EXAM: CT CHEST, ABDOMEN AND PELVIS WITHOUT CONTRAST TECHNIQUE: Multidetector CT imaging of the chest, abdomen and pelvis was performed following the standard protocol without IV contrast. RADIATION DOSE REDUCTION: This exam was performed according to the departmental dose-optimization program which includes automated exposure control, adjustment of the mA and/or kV according to patient size and/or use of iterative reconstruction technique. COMPARISON:  Chest radiograph 01/16/2024. CT  chest 11/05/2023. CT abdomen and pelvis 11/15/2021 FINDINGS: CT CHEST FINDINGS Cardiovascular: Cardiac enlargement. Calcification in the mitral valve annulus, aortic valve, aortic, and coronary arteries. No aortic aneurysm. Mediastinum/Nodes: Esophagus is decompressed. No significant lymphadenopathy. Thyroid gland is unremarkable. Incidental note of a vascular graft in the right arm, likely dialysis graft. Lungs/Pleura: Small bilateral pleural effusions with basilar atelectasis, greater on the left. Bilateral basilar bronchiectasis with bronchial wall thickening and interstitial fibrosis likely indicating  chronic bronchitis. No pneumothorax. Musculoskeletal: Degenerative changes in the spine. Deformity with focal sclerosis in the mid sternum. This is similar to prior study and could represent healing fracture or possibly a metastatic lesion. Ribs appear intact CT ABDOMEN PELVIS FINDINGS Hepatobiliary: Cholelithiasis. No focal liver lesion. No bile duct dilatation. Pancreas: Unremarkable. No pancreatic ductal dilatation or surrounding inflammatory changes. Spleen: Normal in size without focal abnormality. Adrenals/Urinary Tract: 1.4 cm diameter left adrenal gland nodule with Hounsfield unit measurements of -7 consistent with fat containing adenoma. No change since prior study. No imaging follow-up is indicated. Bilateral renal parenchymal atrophy with subcentimeter cysts. No change. No imaging follow-up indicated. No hydronephrosis or hydroureter. Bladder is decompressed. Stomach/Bowel: Stomach is within normal limits. Appendix appears normal. No evidence of bowel wall thickening, distention, or inflammatory changes. Evaluation for focal lesion is limited without oral contrast material but no definitive duodenal mass lesion is identified. There is a large duodenal diverticulum at the junction of the third and fourth portions of the duodenum. Possibly this could account for the lesion identified at endoscopy. Vascular/Lymphatic: Aortic atherosclerosis. No enlarged abdominal or pelvic lymph nodes. Reproductive: Prostate gland is enlarged, measuring 5.6 cm diameter. Other: No free air or free fluid in the abdomen. Surgical clips consistent with mesh hernia repair in the right groin. No residual recurrent hernia. Musculoskeletal: Postoperative fixation from L4 to the sacrum. Degenerative changes in the lumbar spine. Sclerosis and compression of the L3 vertebra, progressed since the prior lumbar spine MRI from 07/22/2022. This is possibly a pathologic fracture with metastatic lesion. IMPRESSION: 1. Chronic bronchitic  changes in the lungs. Bilateral pleural effusions with basilar atelectasis, greater on the left. 2. Aortic atherosclerosis. 3. Cholelithiasis without evidence of acute cholecystitis. 4. Bilateral renal parenchymal atrophy. 5. Enlarged prostate gland. 6. Sclerotic bone lesions demonstrated in the sternum and L3 with mild compression of L3. These could represent healing fractures or metastatic pathologic fractures. Consider PSA correlation. 7. No definitive duodenal mass lesion identified. 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 Electronically Signed   By: Elsie Gravely M.D.   On: 01/17/2024 20:50   DG Chest Port 1 View Result Date: 01/16/2024 CLINICAL DATA:  Shortness of breath. EXAM: PORTABLE CHEST 1 VIEW COMPARISON:  10/28/2023 FINDINGS: Again noted is elevation of the right hemidiaphragm. Lungs are clear. Heart and mediastinum are within normal limits and stable. Atherosclerotic calcifications at the aortic arch. Negative for a pneumothorax. No acute bone abnormality. IMPRESSION: 1. No acute cardiopulmonary disease. 2. Chronic elevation of the right hemidiaphragm. Electronically Signed   By: Juliene Balder M.D.   On: 01/16/2024 13:05   VAS US  CAROTID Result Date: 01/01/2024 Carotid Arterial Duplex Study Patient Name:  DAVEON ARPINO  Date of Exam:   01/01/2024 Medical Rec #: [id number]           Accession #:    [id number] Date of Birth: [birth year]           Patient Gender: M Patient Age:   26 years Exam Location:  [address] Procedure:  VAS US  CAROTID Referring Phys: JONATHAN [name] --------------------------------------------------------------------------------  Indications:       Carotid artery disease and patient denies any cerebrovascular                    symptoms. Risk Factors:      Hypertension, hyperlipidemia, Diabetes, past history of                    smoking, coronary artery disease, PAD. Comparison Study:  On 12/28/2022, a carotid artery duplex showed velocities of                     76/17 cm/s in the RICA and 355/41 cm/s in the LICA. Performing Technologist: Nanetta Shad RVT  Examination Guidelines: A complete evaluation includes B-mode imaging, spectral Doppler, color Doppler, and power Doppler as needed of all accessible portions of each vessel. Bilateral testing is considered an integral part of a complete examination. Limited examinations for reoccurring indications may be performed as noted.  Right Carotid Findings: +----------+--------+--------+--------+------------------+--------+           PSV cm/sEDV cm/sStenosisPlaque DescriptionComments +----------+--------+--------+--------+------------------+--------+ CCA Prox  95      7       <50%    heterogenous      tortuous +----------+--------+--------+--------+------------------+--------+ CCA Distal70      11      <50%    heterogenous               +----------+--------+--------+--------+------------------+--------+ ICA Prox  86      12      1-39%   heterogenous               +----------+--------+--------+--------+------------------+--------+ ICA Mid   73      14              heterogenous               +----------+--------+--------+--------+------------------+--------+ ICA Distal64      20                                         +----------+--------+--------+--------+------------------+--------+ ECA       192     11      >50%    heterogenous               +----------+--------+--------+--------+------------------+--------+ +----------+--------+-------+------------------------------+-------------------+           PSV cm/sEDV cmsDescribe                      Arm Pressure (mmHG) +----------+--------+-------+------------------------------+-------------------+ Subclavian172     30     Mildly elevated PSV and EDV   AVF                                          consistent with AVF                                +----------+--------+-------+------------------------------+-------------------+ +---------+--------+--+--------+--+---------+ VertebralPSV cm/s53EDV cm/s11Antegrade +---------+--------+--+--------+--+---------+  Left Carotid Findings: +----------+--------+--------+--------+------------------+--------------------+           PSV cm/sEDV cm/sStenosisPlaque DescriptionComments             +----------+--------+--------+--------+------------------+--------------------+ CCA Prox  79      9       <  50%    heterogenous      tortuous             +----------+--------+--------+--------+------------------+--------------------+ CCA Distal61      11      <50%    heterogenous                           +----------+--------+--------+--------+------------------+--------------------+ ICA Prox  281     44      40-59%  calcific          Shadowing            +----------+--------+--------+--------+------------------+--------------------+ ICA Mid   220     17              calcific          shadowing, turbulent +----------+--------+--------+--------+------------------+--------------------+ ICA Distal72      22                                                     +----------+--------+--------+--------+------------------+--------------------+ ECA       269     0       >50%    heterogenous      bi-directional       +----------+--------+--------+--------+------------------+--------------------+ +----------+--------+--------+----------------+-------------------+           PSV cm/sEDV cm/sDescribe        Arm Pressure (mmHG) +----------+--------+--------+----------------+-------------------+ Subclavian128     0       Multiphasic, [id number]                 +----------+--------+--------+----------------+-------------------+ +---------+--------+--+--------+-+---------+ VertebralPSV cm/s37EDV cm/s6Antegrade +---------+--------+--+--------+-+---------+   Summary: Right Carotid:  Velocities in the right ICA are consistent with a 1-39% stenosis.                Non-hemodynamically significant plaque <50% noted in the CCA. The                ECA appears >50% stenosed. Left Carotid: Velocities in the left ICA are consistent with a 40-59% stenosis.               Non-hemodynamically significant plaque <50% noted in the CCA. The               ECA appears >50% stenosed. Vertebrals:  Bilateral vertebral arteries demonstrate antegrade flow. Subclavians: Normal flow hemodynamics were seen in the left subclavian artery.              Mildly elevated peak and end diastolic velocities in the right              subclavian artery consistent with AVF. *See table(s) above for measurements and observations. Suggest follow up study in 12 months. Electronically signed by Dorn Lesches MD on 01/01/2024 at 3:32:03 PM.    Final     Microbiology: Results for orders placed or performed during the hospital encounter of 01/26/23  Culture, blood (routine x 2)     Status: None   Collection Time: 01/26/23  3:55 PM   Specimen: BLOOD LEFT FOREARM  Result Value Ref Range Status   Specimen Description BLOOD LEFT FOREARM  Final   Special Requests   Final    BOTTLES DRAWN AEROBIC AND ANAEROBIC Blood Culture results may not be optimal due to an excessive volume of blood received in culture bottles   Culture  Final    NO GROWTH 5 DAYS Performed at [facility], [address] [place], [place] [address]    Report Status 01/31/2023 FINAL  Final  Culture, blood (routine x 2)     Status: None   Collection Time: 01/26/23  4:00 PM   Specimen: BLOOD LEFT FOREARM  Result Value Ref Range Status   Specimen Description BLOOD LEFT FOREARM  Final   Special Requests   Final    BOTTLES DRAWN AEROBIC AND ANAEROBIC Blood Culture adequate volume   Culture   Final    NO GROWTH 5 DAYS Performed at [facility], [address], [place], [place] [address]    Report Status 01/31/2023 FINAL  Final     Labs: CBC: Recent Labs  Lab 01/16/24 1230 01/17/24 0246 01/17/24 0910 01/18/24 0832 01/18/24 1413 01/18/24 1953 01/19/24 0748  WBC 5.5 5.8  --   --   --   --  6.3  NEUTROABS 3.7  --   --   --   --   --   --   HGB 7.0* 8.3* 7.0* 7.9* 8.2* 7.9* 8.3*  HCT 22.4* 25.4* 21.2* 24.3* 24.5* 24.8* 25.3*  MCV 95.3 89.4  --   --   --   --  90.7  PLT 168 143*  --   --   --   --  171   Basic Metabolic Panel: Recent Labs  Lab 01/16/24 1230 01/17/24 0246 01/18/24 0832 01/19/24 0748  NA 137 135 135 132*  K 3.4* 3.9 4.1 4.2  CL 94* 94* 92* 95*  CO2 30 26 27 26   GLUCOSE 139* 113* 90 101*  BUN 32* 46* 67* 52*  CREATININE 5.07* 6.65* 9.04* 7.37*  CALCIUM  8.7* 8.6* 8.9 8.9  PHOS  --   --  5.3* 4.7*   Liver Function Tests: Recent Labs  Lab 01/16/24 1230 01/18/24 0832 01/19/24 0748  AST 12*  --   --   ALT 9  --   --   ALKPHOS 52  --   --   BILITOT 0.3  --   --   PROT 5.6*  --   --   ALBUMIN  2.7* 2.7* 2.6*   CBG: Recent Labs  Lab 01/17/24 1142 01/17/24 1618  GLUCAP 71 138*    Discharge time spent: greater than 30 minutes.  Signed: Lebron JINNY Cage, MD [facility] 01/19/2024

## 2024-01-20 ENCOUNTER — Encounter (HOSPITAL_COMMUNITY): Payer: Self-pay | Admitting: Gastroenterology

## 2024-01-20 ENCOUNTER — Telehealth (HOSPITAL_COMMUNITY): Payer: Self-pay | Admitting: Nephrology

## 2024-01-20 NOTE — Telephone Encounter (Signed)
 Transition of care contact from inpatient facility  Date of Discharge: 01/19/2024 Date of Contact: 01/20/2024 - attempt #1 Method of contact: Phone  Attempted to contact patient to discuss transition of care from inpatient admission. Patient did not answer the phone. Message was left on the patient's voicemail with call back number [phone].  Izetta Boehringer, PA-C [employer] Pager [phone]

## 2024-01-21 ENCOUNTER — Telehealth: Payer: Self-pay

## 2024-01-21 ENCOUNTER — Ambulatory Visit: Admitting: Podiatry

## 2024-01-21 ENCOUNTER — Encounter: Payer: Self-pay | Admitting: Podiatry

## 2024-01-21 DIAGNOSIS — B351 Tinea unguium: Secondary | ICD-10-CM

## 2024-01-21 DIAGNOSIS — Z89431 Acquired absence of right foot: Secondary | ICD-10-CM

## 2024-01-21 DIAGNOSIS — L84 Corns and callosities: Secondary | ICD-10-CM

## 2024-01-21 DIAGNOSIS — M79675 Pain in left toe(s): Secondary | ICD-10-CM

## 2024-01-21 DIAGNOSIS — E1151 Type 2 diabetes mellitus with diabetic peripheral angiopathy without gangrene: Secondary | ICD-10-CM

## 2024-01-21 NOTE — Progress Notes (Unsigned)
 Subjective:  Patient ID: Jon Anderson, male    DOB: 1950/08/05,  MRN: [id number]  Jon Anderson presents to clinic today for:  Chief Complaint  Patient presents with   Diabetes    [facility] Diet control diabetes. Right foot transmet amputation. Toenail trim.   Patient notes nails are thick and elongated, causing pain in shoe gear when ambulating.  He gets painful corns and calluses.  He has a history of a right TMA.  He is requesting diabetic shoes today.  PCP is Dorn Lesches, MD.  Last seen 06/26/2023  Past Medical History:  Diagnosis Date   Anemia of chronic disease    Arthritis    CAD (coronary artery disease)    Carotid stenosis    a. <50% RICA, >70% LICA by duplex 07/2015   Chronic chest pain    occ   Constipation    chronic   Dyspnea    occasional with extertion   ESRD (end stage renal disease) on dialysis (HCC)    M-W-F- [facility]   GERD (gastroesophageal reflux disease)    History of hiatal hernia    History of kidney stones    Passed   HNP (herniated nucleus pulposus), lumbar    HTN (hypertension)    echo 3/10: EF 60%, LAE   Hyperlipidemia    Mitral stenosis    mild MS 11/15/21   Nephrolithiasis    passed them all   PEA (Pulseless electrical activity) (HCC) 11/15/2021   PEA arrest felt likely related to contrast anaphylaxis despite premedication   Peripheral arterial disease    a. s/p PTCA  right    Pneumonia yrs ago   Restless legs    Sciatic leg pain    Sleep apnea    does not use cpap   Snores    a. presumed OSA, pt has refused sleep eval in past.   Type II diabetes mellitus (HCC)    no longer on medications, checks blood glucose at home   Urinary frequency    Uses wheelchair    Walker as ambulation aid    Allergies  Allergen Reactions   Contrast Media [Iodinated Contrast Media] Anaphylaxis, Hives, Shortness Of Breath, Swelling and Other (See Comments)    Cardiac arrest Throat swelling   Kiwi Extract Itching and Swelling    Lip, facial  swelling - does not affect breathing   Flexeril  [Cyclobenzaprine ] Other (See Comments)    Weakness in hands, unable to hold items   Tape Rash    Blistering rash  Reaction occurs with plastic tapes    Objective:  Jon Anderson is a pleasant 73 y.o. male in NAD. AAO x 3.  Vascular Examination: Patient has palpable DP pulse, absent PT pulse bilateral.  Delayed capillary refill bilateral toes.  Sparse digital hair bilateral.  Proximal to distal cooling WNL bilateral.    Dermatological Examination: Interspaces are clear with no open lesions noted bilateral.  Skin is shiny and atrophic bilateral.  Nails are 3-74mm thick, with yellowish/brown discoloration, subungual debris and distal onycholysis x10.  There is pain with compression of nails x10.  There are hyperkeratotic lesions noted across the right TMA site as well as the distal portion of the right fifth metatarsal plantar-distal edge, and left submet 5.     Latest Ref Rng & Units 01/18/2024    8:32 AM  Hemoglobin A1C  Hemoglobin-A1c 4.8 - 5.6 % 5.9    Patient qualifies for at-risk foot care because of history of  right transmetatarsal amputation of foot.  Assessment/Plan: 1. Dermatophytosis of nail   2. S/P transmetatarsal amputation of foot, right (HCC)   3. Type II diabetes mellitus with peripheral circulatory disorder (HCC)   4. Pain in left toe(s)   5. Callus of foot     Mycotic nails x5 were sharply debrided with sterile nail nippers and power debriding burr to decrease bulk and length.  Hyperkeratotic lesions x 3 were shaved with #312 blade.  They were also sanded with an umbrella bur today  Patient qualifies for the diabetic shoe program due to history of diabetes with previous transmetatarsal amputation right foot.  He will be referred to bionic for the shoes.  Paperwork was completed and placed in triage office to be faxed.  Return in about 3 months (around 04/22/2024) for [facility].   Jon Anderson, DPM,  FACFAS [facility]     [address] [place], [place] [address]                Office [phone]  Fax [phone]

## 2024-01-21 NOTE — Telephone Encounter (Signed)
 Attempted to call for surgery scheduling. LVM

## 2024-01-21 NOTE — Progress Notes (Signed)
 Late note entry 10/13 1000am  Contacted out-pt HD clinic,[facility] to inform of pt d/c and that pt should have arrived back to clinic today.  No further support needed.   [facility] [phone]

## 2024-01-22 ENCOUNTER — Ambulatory Visit: Attending: Vascular Surgery | Admitting: Physician Assistant

## 2024-01-22 VITALS — BP 109/67 | HR 92 | Temp 98.7°F | Resp 18 | Ht 69.0 in | Wt 176.0 lb

## 2024-01-22 DIAGNOSIS — N186 End stage renal disease: Secondary | ICD-10-CM | POA: Diagnosis not present

## 2024-01-22 NOTE — Progress Notes (Signed)
 Office Note     CC:  follow up Requesting Provider:  Marlee Bernardino NOVAK, MD  HPI: Jon Anderson is a 73 y.o. (April 18, 1950) male who presents for evaluation of right arm swelling.  He underwent right arm AV graft placement in March 2025 by Dr. Pearline.  He believes since that time he has had right arm swelling that comes and goes.  Right upper extremity venous duplex was negative in July of this year.  He was recently admitted with a GI bleed with plan being to proceed with shuntogram in the outpatient setting.  He believes he has recovered from his recent hospitalization.  He has had numerous access surgery in the left arm.  Right arm AV graft is functioning appropriately during hemodialysis.   Past Medical History:  Diagnosis Date   Anemia of chronic disease    Arthritis    CAD (coronary artery disease)    Carotid stenosis    a. <50% RICA, >70% LICA by duplex 07/2015   Chronic chest pain    occ   Constipation    chronic   Dyspnea    occasional with extertion   ESRD (end stage renal disease) on dialysis (HCC)    M-W-F- [facility]   GERD (gastroesophageal reflux disease)    History of hiatal hernia    History of kidney stones    Passed   HNP (herniated nucleus pulposus), lumbar    HTN (hypertension)    echo 3/10: EF 60%, LAE   Hyperlipidemia    Mitral stenosis    mild MS 11/15/21   Nephrolithiasis    passed them all   PEA (Pulseless electrical activity) (HCC) 11/15/2021   PEA arrest felt likely related to contrast anaphylaxis despite premedication   Peripheral arterial disease    a. s/p PTCA  right    Pneumonia yrs ago   Restless legs    Sciatic leg pain    Sleep apnea    does not use cpap   Snores    a. presumed OSA, pt has refused sleep eval in past.   Type II diabetes mellitus (HCC)    no longer on medications, checks blood glucose at home   Urinary frequency    Uses wheelchair    Walker as ambulation aid     Past Surgical History:  Procedure Laterality Date    A/V FISTULAGRAM Left 01/10/2021   Procedure: A/V FISTULAGRAM;  Surgeon: Sheree Penne Bruckner, MD;  Location: [facility] INVASIVE CV LAB;  Service: Cardiovascular;  Laterality: Left;   A/V FISTULAGRAM Left 05/19/2021   Procedure: A/V Fistulagram;  Surgeon: Gretta Bruckner PARAS, MD;  Location: [facility] INVASIVE CV LAB;  Service: Cardiovascular;  Laterality: Left;   ABDOMINAL AORTOGRAM W/LOWER EXTREMITY Bilateral 08/21/2019   Procedure: ABDOMINAL AORTOGRAM W/LOWER EXTREMITY;  Surgeon: Sheree Penne Bruckner, MD;  Location: [facility] INVASIVE CV LAB;  Service: Cardiovascular;  Laterality: Bilateral;   AMPUTATION Right 07/25/2019   Procedure: RIGHT 5th RAY AMPUTATION;  Surgeon: Harden Jerona GAILS, MD;  Location: [facility] OR;  Service: Orthopedics;  Laterality: Right;   AMPUTATION Right 11/19/2019   Procedure: RIGHT TRANSMETATARSAL AMPUTATION;  Surgeon: Harden Jerona GAILS, MD;  Location: [facility] OR;  Service: Orthopedics;  Laterality: Right;   ANGIOPLASTY / STENTING FEMORAL Left 12/11/2013   dr berry   AV FISTULA PLACEMENT Left 03/19/2014   Procedure: CREATION OF ARTERIOVENOUS (AV) FISTULA  LEFT UPPER ARM;  Surgeon: Lynwood JONETTA Collum, MD;  Location: [facility] OR;  Service: Vascular;  Laterality: Left;   AV FISTULA  PLACEMENT Left 11/09/2020   Procedure: LEFT ARM ARTERIOVENOUS (AV) FISTULA CREATION;  Surgeon: Sheree Penne Bruckner, MD;  Location[facility] OR;  Service: Vascular;  Laterality: Left;   AV FISTULA PLACEMENT Left 02/15/2021   Procedure: INSERTION OF LEFT ARM ARTERIOVENOUS GORE-TEX GRAFT;  Surgeon: Sheree Penne Bruckner, MD;  Location: [facility] OR;  Service: Vascular;  Laterality: Left;   AV FISTULA PLACEMENT Right 02/16/2022   Procedure: RIGHT ARM BRACHIOCEPAHLIC ARTERIOVENOUS (AV) FISTULA CREATION;  Surgeon: Sheree Penne Bruckner, MD;  Location: [facility] OR;  Service: Vascular;  Laterality: Right;  Regional and [facility]   AV FISTULA PLACEMENT Right 06/19/2023   Procedure: INSERTION OF ARTERIOVENOUS (AV) GORE-TEX GRAFT RIGHT ARM;  Surgeon: Pearline Norman RAMAN, MD;  Location: [facility] OR;  Service: Vascular;  Laterality: Right;   BACK SURGERY  01/2018   screws placed    BASCILIC VEIN TRANSPOSITION Left 12/14/2020   Procedure: REVISION OF LEFT ARM SECOND STAGE BASILIC VEIN TRANSPOSITION;  Surgeon: Sheree Penne Bruckner, MD;  Location: [facility] OR;  Service: Vascular;  Laterality: Left;   BASCILIC VEIN TRANSPOSITION Right 04/18/2022   Procedure: RIGHT BRACHIOBASILIC FISTULA SECOND STAGE TRANSPOSITION;  Surgeon: Sheree Penne Bruckner, MD;  Location: [facility] OR;  Service: Vascular;  Laterality: Right;   CARDIAC CATHETERIZATION  2001 and 2010    COLONOSCOPY W/ BIOPSIES AND POLYPECTOMY     COLONOSCOPY WITH PROPOFOL  N/A 08/01/2016   Procedure: COLONOSCOPY WITH PROPOFOL ;  Surgeon: Belvie Just, MD;  Location: [facility] ENDOSCOPY;  Service: Endoscopy;  Laterality: N/A;   COLONOSCOPY WITH PROPOFOL  N/A 01/17/2022   Procedure: COLONOSCOPY WITH PROPOFOL ;  Surgeon: Just Belvie, MD;  Location: [facility] ENDOSCOPY;  Service: Gastroenterology;  Laterality: N/A;   CORONARY ARTERY BYPASS GRAFT  2019   [facility] x [facility] bypass   ESOPHAGOGASTRODUODENOSCOPY N/A 01/17/2024   Procedure: EGD (ESOPHAGOGASTRODUODENOSCOPY);  Surgeon: Just Belvie, MD;  Location: [facility] ENDOSCOPY;  Service: Gastroenterology;  Laterality: N/A;   ESOPHAGOGASTRODUODENOSCOPY (EGD) WITH PROPOFOL  N/A 08/01/2016   Procedure: ESOPHAGOGASTRODUODENOSCOPY (EGD) WITH PROPOFOL ;  Surgeon: Belvie Just, MD;  Location: [facility] ENDOSCOPY;  Service: Endoscopy;  Laterality: N/A;   FISTULA SUPERFICIALIZATION Left 02/10/2020   Procedure: 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;  Surgeon: Oris Krystal FALCON, MD;  Location: [facility] OR;  Service: Vascular;  Laterality: Left;   FISTULA SUPERFICIALIZATION Left 03/23/2020   Procedure: LEFT UPPER EXTREMITY ARTERIOVENOUS FISTULA PLICATION;  Surgeon: Oris Krystal FALCON, MD;  Location: [facility] OR;  Service: Vascular;  Laterality: Left;   FOOT FRACTURE SURGERY Right    ligament repair   FRACTURE SURGERY      left forearm   GRAFT APPLICATION Right 05/13/2019   Procedure: FAT GRAFT APPLICATION;  Surgeon: Gretel Ozell PARAS, DPM;  Location: [facility] [place];  Service: Podiatry;  Laterality: Right;   HEMATOMA EVACUATION Left 11/09/2020   Procedure: EVACUATION HEMATOMA LEFT ARM;  Surgeon: Eliza Bruckner RAMAN, MD;  Location: [facility] OR;  Service: Vascular;  Laterality: Left;   INGUINAL HERNIA REPAIR Left    IR AV DIALY SHUNT INTRO NEEDLE/INTRACATH INITIAL W/PTA/IMG LEFT  11/15/2021   IR AV DIALY SHUNT INTRO NEEDLE/INTRACATH INITIAL W/PTA/IMG RIGHT Right 08/15/2022   IR FLUORO GUIDE CV LINE LEFT  01/26/2023   IR FLUORO GUIDE CV LINE RIGHT  01/20/2022   IR FLUORO GUIDE CV LINE RIGHT  05/03/2022   IR REMOVAL TUN CV CATH W/O FL  06/16/2021   IR REMOVAL TUN CV CATH W/O FL  07/06/2022   IR US  GUIDE VASC ACCESS LEFT  11/15/2021   IR US  GUIDE VASC ACCESS  LEFT  11/15/2021   IR US  GUIDE VASC ACCESS LEFT  01/26/2023   IR US  GUIDE VASC ACCESS RIGHT  11/15/2021   IR US  GUIDE VASC ACCESS RIGHT  01/20/2022   IR US  GUIDE VASC ACCESS RIGHT  08/15/2022   LEFT HEART CATH AND CORS/GRAFTS ANGIOGRAPHY N/A 04/27/2017   Procedure: LEFT HEART CATH AND CORS/GRAFTS ANGIOGRAPHY;  Surgeon: Anner Alm ORN, MD;  Location: [facility] INVASIVE CV LAB;  Service: Cardiovascular;  Laterality: N/A;   LEFT HEART CATHETERIZATION WITH CORONARY ANGIOGRAM N/A 06/22/2014   Procedure: LEFT HEART CATHETERIZATION WITH CORONARY ANGIOGRAM;  Surgeon: Debby DELENA Sor, MD;  Location: [facility] CATH LAB;  Service: Cardiovascular;  Laterality: N/A;   LOWER EXTREMITY ANGIOGRAM Left 12/11/2013   Procedure: LOWER EXTREMITY ANGIOGRAM;  Surgeon: Dorn JINNY Lesches, MD;  Location: [facility] CATH LAB;  Service: Cardiovascular;  Laterality: Left;   LUMBAR LAMINECTOMY/DECOMPRESSION MICRODISCECTOMY Right 07/03/2017   Procedure: MICRODISCECTOMY LUMBAR FIVE - SACRAL ONE RIGHT;  Surgeon: Lanis Pupa, MD;  Location: [facility] OR;  Service: Neurosurgery;  Laterality: Right;   LUMBAR  LAMINECTOMY/DECOMPRESSION MICRODISCECTOMY Right 10/19/2017   Procedure: MICRODISCECTOMY LUMBAR FIVE- SACRAL 1 ONE ;  Surgeon: Lanis Pupa, MD;  Location: [facility] OR;  Service: Neurosurgery;  Laterality: Right;   PERIPHERAL VASCULAR BALLOON ANGIOPLASTY Left 05/19/2021   Procedure: PERIPHERAL VASCULAR BALLOON ANGIOPLASTY;  Surgeon: Gretta Lonni JINNY, MD;  Location: [facility] INVASIVE CV LAB;  Service: Cardiovascular;  Laterality: Left;   PERIPHERAL VASCULAR INTERVENTION Left 01/10/2021   Procedure: PERIPHERAL VASCULAR INTERVENTION;  Surgeon: Sheree Penne Lonni, MD;  Location: [facility] INVASIVE CV LAB;  Service: Cardiovascular;  Laterality: Left;   POLYPECTOMY  01/17/2022   Procedure: POLYPECTOMY;  Surgeon: Rollin Dover, MD;  Location: [facility];  Service: Gastroenterology;;   THROMBECTOMY W/ EMBOLECTOMY Left 11/09/2020   Procedure: THROMBECTOMY OF LEFT ARTERIOVENOUS FISTULA;  Surgeon: Eliza Lonni RAMAN, MD;  Location: [facility] OR;  Service: Vascular;  Laterality: Left;   TONSILLECTOMY AND ADENOIDECTOMY     WISDOM TOOTH EXTRACTION     WOUND DEBRIDEMENT Right 05/13/2019   Procedure: DEBRIDEMENT WOUND;  Surgeon: Gretel Ozell JINNY, DPM;  Location: [facility];  Service: Podiatry;  Laterality: Right;    Social History   Socioeconomic History   Marital status: Married    Spouse name: Not on file   Number of children: Not on file   Years of education: Not on file   Highest education level: Not on file  Occupational History   Occupation: retired  Tobacco Use   Smoking status: Former    Current packs/day: 0.00    Average packs/day: 1 pack/day for 2.0 years (2.0 ttl pk-yrs)    Types: Cigarettes    Start date: 71    Quit date: 1978    Years since quitting: 47.8    Passive exposure: Never   Smokeless tobacco: Never  Vaping Use   Vaping status: Never Used  Substance and Sexual Activity   Alcohol use: Not Currently    Alcohol/week: 0.0 standard drinks of alcohol    Comment: h/o  social drinking   Drug use: Not Currently    Types: Marijuana    Comment: quit 1986   Sexual activity: Yes    Birth control/protection: None  Other Topics Concern   Not on file  Social History Narrative   The patient is a Copy.  He is married and has 2 grown children.  Lives in [place] with his wife.  Denies tobacco, alcohol or IV drug abuse or marijuana or cocaine intake.  Social Drivers of Corporate investment banker Strain: Not on file  Food Insecurity: Food Insecurity Present (01/16/2024)   Hunger Vital Sign    Worried About Running Out of Food in the Last Year: Sometimes true    Ran Out of Food in the Last Year: Often true  Transportation Needs: No Transportation Needs (01/16/2024)   PRAPARE - Administrator, Civil Service (Medical): No    Lack of Transportation (Non-Medical): No  Physical Activity: Not on file  Stress: Not on file  Social Connections: Moderately Integrated (01/16/2024)   Social Connection and Isolation Panel    Frequency of Communication with Friends and Family: More than three times a week    Frequency of Social Gatherings with Friends and Family: Twice a week    Attends Religious Services: 1 to 4 times per year    Active Member of [employer] or Organizations: No    Attends Banker Meetings: Never    Marital Status: Married  Catering manager Violence: Not At Risk (01/16/2024)   Humiliation, Afraid, Rape, and Kick questionnaire    Fear of Current or Ex-Partner: No    Emotionally Abused: No    Physically Abused: No    Sexually Abused: No    Family History  Problem Relation Age of Onset   Heart attack Sister        died @ 74   Cancer Mother        died @ 7; unknown type   Diabetes Brother        deceased   Cirrhosis Father        alcohol related   Diabetes Father    Esophageal cancer Neg Hx    Colon cancer Neg Hx    Pancreatic cancer Neg Hx    Stomach cancer Neg Hx     Current Outpatient Medications  Medication Sig  Dispense Refill   acetaminophen  (TYLENOL ) 500 MG tablet Take 1,000 mg by mouth 2 (two) times daily as needed for headache or fever (pain).     aspirin  EC 81 MG tablet Take 81 mg by mouth at bedtime.     CALCITRIOL  PO Take 2 mcg by mouth every Monday, Wednesday, and Friday with hemodialysis. Provided and administered by dialysis clinic     calcium  acetate (PHOSLO ) 667 MG capsule Take 2 capsules (1,334 mg total) by mouth 2 (two) times daily with a meal. 180 capsule 0   [Paused] clopidogrel  (PLAVIX ) 75 MG tablet Take 75 mg by mouth daily.     cyanocobalamin (VITAMIN B12) 1000 MCG tablet Take 1 tablet (1,000 mcg total) by mouth daily. 30 tablet 0   gabapentin  (NEURONTIN ) 100 MG capsule Take 100-200 mg by mouth See admin instructions. Take 1 capsule (100mg ) by mouth every morning and take 2 capsules (200mg ) at bedtime.     HYDROcodone -acetaminophen  (NORCO) 7.5-325 MG tablet Take 1 tablet by mouth every 8 (eight) hours as needed for severe pain (pain score 7-10).     Methoxy PEG-Epoetin Beta (MIRCERA) 50 MCG/0.3ML SOSY Inject 50 mcg into the vein every 14 (fourteen) days. Provided and administered by dialysis clinic     pantoprazole  (PROTONIX ) 40 MG tablet Take 1 tablet (40 mg total) by mouth 2 (two) times daily. 60 tablet 0   No current facility-administered medications for this visit.    Allergies  Allergen Reactions   Contrast Media [Iodinated Contrast Media] Anaphylaxis, Hives, Shortness Of Breath, Swelling and Other (See Comments)    Cardiac arrest Throat swelling  Kiwi Extract Itching and Swelling    Lip, facial swelling - does not affect breathing   Flexeril  [Cyclobenzaprine ] Other (See Comments)    Weakness in hands, unable to hold items   Tape Rash    Blistering rash  Reaction occurs with plastic tapes     REVIEW OF SYSTEMS:  Negative unless noted in HPI [X]  denotes positive finding, [ ]  denotes negative finding Cardiac  Comments:  Chest pain or chest pressure:    Shortness  of breath upon exertion:    Short of breath when lying flat:    Irregular heart rhythm:        Vascular    Pain in calf, thigh, or hip brought on by ambulation:    Pain in feet at night that wakes you up from your sleep:     Blood clot in your veins:    Leg swelling:         Pulmonary    Oxygen at home:    Productive cough:     Wheezing:         Neurologic    Sudden weakness in arms or legs:     Sudden numbness in arms or legs:     Sudden onset of difficulty speaking or slurred speech:    Temporary loss of vision in one eye:     Problems with dizziness:         Gastrointestinal    Blood in stool:     Vomited blood:         Genitourinary    Burning when urinating:     Blood in urine:        Psychiatric    Major depression:         Hematologic    Bleeding problems:    Problems with blood clotting too easily:        Skin    Rashes or ulcers:        Constitutional    Fever or chills:      PHYSICAL EXAMINATION:  Vitals:   01/22/24 0838  BP: 109/67  Pulse: 92  Resp: 18  Temp: 98.7 F (37.1 C)  TempSrc: Temporal  Weight: 176 lb (79.8 kg)  Height: 5' 9 (1.753 m)    General:  WDWN in NAD; vital signs documented above Gait: Not observed HENT: WNL, normocephalic Pulmonary: normal non-labored breathing Cardiac: regular HR Abdomen: soft, NT, no masses Skin: without rashes Vascular Exam/Pulses: palpable thrill R arm AVG Extremities: without ischemic changes, without Gangrene , without cellulitis; without open wounds;  Musculoskeletal: no muscle wasting or atrophy  Neurologic: A&O X 3 Psychiatric:  The pt has Normal affect.     ASSESSMENT/PLAN:: 73 y.o. male here for follow up for evaluation of right arm swelling since AV graft placement in March of this year  Mr. Veselka underwent right arm AV graft placement in March of this year.  He believes since surgery he has had swelling of the right arm that comes and goes.  He was recently seen in the hospital  while being hospitalized for a GI bleed.  Dr. Pearline communicated to the patient that we would schedule a shuntogram in the outpatient setting.  We discussed proceeding with shuntogram now that he has recovered from his recent hospitalization however he is concerned about receiving contrast.  He states, the last time he received contrast his heart stopped and he was in the ICU for a week.   he has had numerous access surgeries including fistulogram's  in the past.  I am unable to locate the record of contrast exposure leading to a cardiac event.  He believes he is able to tolerate the right arm swelling for now.  We discussed wrapping his arm from the hand to the elbow with an Ace wrap.  We also discussed elevating the arm when resting during the day.  Okay to continue HD via right arm AV graft for now.   Donnice Sender, PA-C [facility] [phone]  Clinic MD:   Magda

## 2024-01-23 ENCOUNTER — Other Ambulatory Visit: Payer: Self-pay | Admitting: Gastroenterology

## 2024-01-24 ENCOUNTER — Encounter (HOSPITAL_COMMUNITY): Payer: Self-pay | Admitting: Gastroenterology

## 2024-01-24 NOTE — Progress Notes (Signed)
 Attempted to obtain medical history for pre op call via telephone, unable to reach at this time. HIPAA compliant voicemail message left requesting return call to pre surgical testing department.

## 2024-01-25 ENCOUNTER — Encounter (HOSPITAL_COMMUNITY)
Admission: RE | Disposition: A | Discharge status: Home / Self Care | Payer: Self-pay | Source: Home / Self Care | Attending: Gastroenterology

## 2024-01-25 ENCOUNTER — Ambulatory Visit (HOSPITAL_COMMUNITY): Admitting: Certified Registered Nurse Anesthetist

## 2024-01-25 ENCOUNTER — Encounter (HOSPITAL_COMMUNITY): Payer: Self-pay | Admitting: Gastroenterology

## 2024-01-25 ENCOUNTER — Other Ambulatory Visit: Payer: Self-pay

## 2024-01-25 ENCOUNTER — Ambulatory Visit (HOSPITAL_COMMUNITY)
Admission: RE | Admit: 2024-01-25 | Discharge: 2024-01-25 | Disposition: A | Discharge status: Home / Self Care | Attending: Gastroenterology | Admitting: Gastroenterology

## 2024-01-25 DIAGNOSIS — K921 Melena: Secondary | ICD-10-CM | POA: Insufficient documentation

## 2024-01-25 DIAGNOSIS — Z992 Dependence on renal dialysis: Secondary | ICD-10-CM | POA: Diagnosis not present

## 2024-01-25 DIAGNOSIS — Z955 Presence of coronary angioplasty implant and graft: Secondary | ICD-10-CM | POA: Insufficient documentation

## 2024-01-25 DIAGNOSIS — Z7902 Long term (current) use of antithrombotics/antiplatelets: Secondary | ICD-10-CM | POA: Insufficient documentation

## 2024-01-25 DIAGNOSIS — I2510 Atherosclerotic heart disease of native coronary artery without angina pectoris: Secondary | ICD-10-CM | POA: Insufficient documentation

## 2024-01-25 DIAGNOSIS — E1122 Type 2 diabetes mellitus with diabetic chronic kidney disease: Secondary | ICD-10-CM | POA: Diagnosis not present

## 2024-01-25 DIAGNOSIS — D631 Anemia in chronic kidney disease: Secondary | ICD-10-CM | POA: Insufficient documentation

## 2024-01-25 DIAGNOSIS — E1151 Type 2 diabetes mellitus with diabetic peripheral angiopathy without gangrene: Secondary | ICD-10-CM | POA: Diagnosis not present

## 2024-01-25 DIAGNOSIS — K317 Polyp of stomach and duodenum: Secondary | ICD-10-CM | POA: Insufficient documentation

## 2024-01-25 DIAGNOSIS — K449 Diaphragmatic hernia without obstruction or gangrene: Secondary | ICD-10-CM | POA: Insufficient documentation

## 2024-01-25 DIAGNOSIS — I120 Hypertensive chronic kidney disease with stage 5 chronic kidney disease or end stage renal disease: Secondary | ICD-10-CM | POA: Diagnosis not present

## 2024-01-25 DIAGNOSIS — Z87891 Personal history of nicotine dependence: Secondary | ICD-10-CM | POA: Insufficient documentation

## 2024-01-25 DIAGNOSIS — N186 End stage renal disease: Secondary | ICD-10-CM | POA: Insufficient documentation

## 2024-01-25 DIAGNOSIS — K219 Gastro-esophageal reflux disease without esophagitis: Secondary | ICD-10-CM | POA: Diagnosis not present

## 2024-01-25 DIAGNOSIS — K2980 Duodenitis without bleeding: Secondary | ICD-10-CM | POA: Insufficient documentation

## 2024-01-25 DIAGNOSIS — E119 Type 2 diabetes mellitus without complications: Secondary | ICD-10-CM | POA: Diagnosis not present

## 2024-01-25 HISTORY — PX: ESOPHAGOGASTRODUODENOSCOPY: SHX5428

## 2024-01-25 LAB — POCT I-STAT, CHEM 8
BUN: 13 mg/dL (ref 8–23)
Calcium, Ion: 1.04 mmol/L — ABNORMAL LOW (ref 1.15–1.40)
Chloride: 92 mmol/L — ABNORMAL LOW (ref 98–111)
Creatinine, Ser: 5.8 mg/dL — ABNORMAL HIGH (ref 0.61–1.24)
Glucose, Bld: 107 mg/dL — ABNORMAL HIGH (ref 70–99)
HCT: 27 % — ABNORMAL LOW (ref 39.0–52.0)
Hemoglobin: 9.2 g/dL — ABNORMAL LOW (ref 13.0–17.0)
Potassium: 3.5 mmol/L (ref 3.5–5.1)
Sodium: 135 mmol/L (ref 135–145)
TCO2: 31 mmol/L (ref 22–32)

## 2024-01-25 SURGERY — EGD (ESOPHAGOGASTRODUODENOSCOPY)
Anesthesia: Monitor Anesthesia Care

## 2024-01-25 MED ORDER — PHENYLEPHRINE 80 MCG/ML (10ML) SYRINGE FOR IV PUSH (FOR BLOOD PRESSURE SUPPORT)
PREFILLED_SYRINGE | INTRAVENOUS | Status: DC | PRN
  Administered 2024-01-25: 160 ug via INTRAVENOUS
  Administered 2024-01-25 (×2): 240 ug via INTRAVENOUS
  Administered 2024-01-25: 80 ug via INTRAVENOUS
  Administered 2024-01-25: 240 ug via INTRAVENOUS
  Administered 2024-01-25: 160 ug via INTRAVENOUS

## 2024-01-25 MED ORDER — LIDOCAINE 2% (20 MG/ML) 5 ML SYRINGE
INTRAMUSCULAR | Status: DC | PRN
  Administered 2024-01-25: 100 mg via INTRAVENOUS

## 2024-01-25 MED ORDER — EPHEDRINE SULFATE-NACL 50-0.9 MG/10ML-% IV SOSY
PREFILLED_SYRINGE | INTRAVENOUS | Status: DC | PRN
  Administered 2024-01-25: 10 mg via INTRAVENOUS

## 2024-01-25 MED ORDER — GLUCAGON HCL RDNA (DIAGNOSTIC) 1 MG IJ SOLR
INTRAMUSCULAR | Status: DC | PRN
  Administered 2024-01-25: .5 mg via INTRAVENOUS

## 2024-01-25 MED ORDER — PROPOFOL 500 MG/50ML IV EMUL
INTRAVENOUS | Status: AC
  Filled 2024-01-25: qty 50

## 2024-01-25 MED ORDER — PROPOFOL 10 MG/ML IV BOLUS
INTRAVENOUS | Status: DC | PRN
  Administered 2024-01-25: 200 ug/kg/min via INTRAVENOUS
  Administered 2024-01-25: 30 mg via INTRAVENOUS

## 2024-01-25 MED ORDER — SODIUM CHLORIDE (PF) 0.9 % IJ SOLN
PREFILLED_SYRINGE | INTRAVENOUS | Status: DC | PRN
  Administered 2024-01-25: 4 mL

## 2024-01-25 MED ORDER — SODIUM CHLORIDE 0.9 % IV SOLN: INTRAVENOUS | Status: DC

## 2024-01-25 NOTE — Anesthesia Procedure Notes (Signed)
 Procedure Name: MAC Date/Time: 01/25/2024 11:21 AM  Performed by: Franchot Delon RAMAN, CRNAPre-anesthesia Checklist: Patient identified, Emergency Drugs available, Suction available and Patient being monitored Oxygen Delivery Method: Simple face mask Airway Equipment and Method: Bite block Placement Confirmation: positive ETCO2 Dental Injury: Teeth and Oropharynx as per pre-operative assessment

## 2024-01-25 NOTE — Interval H&P Note (Signed)
 History and Physical Interval Note:  01/25/2024 11:01 AM  Jon Anderson  has presented today for surgery, with the diagnosis of GI bleed.  The various methods of treatment have been discussed with the patient and family. After consideration of risks, benefits and other options for treatment, the patient has consented to  Procedure(s): EGD (ESOPHAGOGASTRODUODENOSCOPY) (N/A) as a surgical intervention.  The patient's history has been reviewed, patient examined, no change in status, stable for surgery.  I have reviewed the patient's chart and labs.  Questions were answered to the patient's satisfaction.     Kodee Drury D

## 2024-01-25 NOTE — Discharge Instructions (Signed)

## 2024-01-25 NOTE — Op Note (Signed)
 [facility] Patient Name: Jon Anderson Procedure Date: 01/25/2024 MRN: [id number] Attending MD: Belvie Just , MD, [id number] Date of Birth: 01-03-1951 CSN: [id number] Age: 73 Admit Type: Ambulatory Procedure:                Upper GI endoscopy Indications:              Melena Providers:                Belvie Just, MD, Willy Hummer, RN, Fairy Marina, Technician Referring MD:              Medicines:                Propofol  per Anesthesia Complications:            No immediate complications. Estimated Blood Loss:     Estimated blood loss: none. Estimated blood loss:                            none. Procedure:                Pre-Anesthesia Assessment:                           - Prior to the procedure, a History and Physical                            was performed, and patient medications and                            allergies were reviewed. The patient's tolerance of                            previous anesthesia was also reviewed. The risks                            and benefits of the procedure and the sedation                            options and risks were discussed with the patient.                            All questions were answered, and informed consent                            was obtained. Prior Anticoagulants: The patient has                            taken Plavix  (clopidogrel ), last dose was 3 days                            prior to procedure. ASA Grade Assessment: III - A                            patient with  severe systemic disease. After                            reviewing the risks and benefits, the patient was                            deemed in satisfactory condition to undergo the                            procedure.                           - Sedation was administered by an anesthesia                            professional. Deep sedation was attained.                           After obtaining  informed consent, the endoscope was                            passed under direct vision. Throughout the                            procedure, the patient's blood pressure, pulse, and                            oxygen saturations were monitored continuously. The                            [id number]-[id number] ([id number]) Olympus duodenoscope was                            introduced through the mouth, and advanced to the                            second part of duodenum. The [id number]-H190 ([id number])                            Olympus endoscope was introduced through the and                            advanced to the. The upper GI endoscopy was                            technically difficult and complex. The patient                            tolerated the procedure well. Scope In: Scope Out: Findings:      The esophagus was normal.      The stomach was normal.      A single 20 mm pedunculated polyp with no bleeding was found in the       second portion of the duodenum. The polyp was removed with a hot snare.  Resection and retrieval were complete. Area was successfully injected       with 4 mL of a 0.1 mg/mL solution of epinephrine  for drug delivery. To       prevent bleeding post-intervention, four hemostatic clips were       successfully placed (MR safe). Clip manufacturer: [employer].       There was no bleeding at the end of the procedure.      Localized mild mucosal changes characterized by altered texture were       found in the ampulla. Biopsies were taken with a cold forceps for       histology.      The duodenoscope was initially inserted to obtain a better view of the       second portion of the duodenum and the associated lesion. For unknown       reasons, the transition from D1 to D2 was very torturous. This was a       change from the EGD one week ago. With the adult upper endoscope       difficulty was also encountered traversing this area. A column of water        was used to  facilitate maneuvering through the area. Very good       visualization was obtained and the lesion was a large pedunculated       polyp. To prepare for polypectomy, 4 mL of 1:10,000 Epi was injected       into the base and the head of the polyp. A snare was then deployed and       it was able to capture the stalk. A pure coag current was used and the       polyp was successfully resected. It was retieved with a [employer]. The       polypectomy site was then closed with three hemoclips. The ampullary       mucosa has the appearance of adenomatous tissue. Several cold biopsies       were obtained. Impression:               - Normal esophagus.                           - Normal stomach.                           - A single duodenal polyp. Resected and retrieved.                            Injected. Clip manufacturer: [employer].                            Clips (MR safe) were placed. Moderate Sedation:      Not Applicable - Patient had care per Anesthesia. Recommendation:           - Patient has a contact number available for                            emergencies. The signs and symptoms of potential                            delayed complications were discussed with the  patient. Return to normal activities tomorrow.                            Written discharge instructions were provided to the                            patient.                           - Resume regular diet.                           - Continue present medications.                           - Await pathology results.                           - Return to GI clinic in 4 weeks. Procedure Code(s):        --- Professional ---                           [phone], Esophagogastroduodenoscopy, flexible,                            transoral; with removal of tumor(s), polyp(s), or                            other lesion(s) by snare technique                           43239, 59,  Esophagogastroduodenoscopy, flexible,                            transoral; with biopsy, single or multiple                           43236, 59, Esophagogastroduodenoscopy, flexible,                            transoral; with directed submucosal injection(s),                            any substance Diagnosis Code(s):        --- Professional ---                           K31.7, Polyp of stomach and duodenum                           K92.1, Melena (includes Hematochezia) CPT copyright 2022 [employer]. All rights reserved. The codes documented in this report are preliminary and upon coder review may  be revised to meet current compliance requirements. Belvie Just, MD Belvie Just, MD 01/25/2024 12:58:52 PM This report has been signed electronically. Number of Addenda: 0

## 2024-01-25 NOTE — Transfer of Care (Signed)
 Immediate Anesthesia Transfer of Care Note  Patient: Jon Anderson  Procedure(s) Performed: EGD (ESOPHAGOGASTRODUODENOSCOPY)  Patient Location: PACU Endoscopy  Anesthesia Type:MAC  Level of Consciousness: drowsy and responds to stimulation  Airway & Oxygen Therapy: Patient Spontanous Breathing and Patient connected to face mask oxygen  Post-op Assessment: Report given to RN and Post -op Vital signs reviewed and stable  Post vital signs: Reviewed and stable  Last Vitals:  Vitals Value Taken Time  BP 108/33 01/25/24 12:20  Temp    Pulse 71 01/25/24 12:23  Resp 16 01/25/24 12:23  SpO2 100 % 01/25/24 12:23  Vitals shown include unfiled device data.  Last Pain:  Vitals:   01/25/24 1050  TempSrc: Temporal  PainSc: 0-No pain         Complications: No notable events documented.

## 2024-01-25 NOTE — Anesthesia Postprocedure Evaluation (Signed)
 Anesthesia Post Note  Patient: Jon Anderson  Procedure(s) Performed: EGD (ESOPHAGOGASTRODUODENOSCOPY)     Patient location during evaluation: PACU Anesthesia Type: MAC Level of consciousness: awake and alert Pain management: pain level controlled Vital Signs Assessment: post-procedure vital signs reviewed and stable Respiratory status: spontaneous breathing, nonlabored ventilation and respiratory function stable Cardiovascular status: blood pressure returned to baseline and stable Postop Assessment: no apparent nausea or vomiting Anesthetic complications: no   No notable events documented.  Last Vitals:  Vitals:   01/25/24 1250 01/25/24 1300  BP: (!) 136/37 (!) 147/40  Pulse: 88 82  Resp: 18 20  Temp:    SpO2: 97% 96%    Last Pain:  Vitals:   01/25/24 1300  TempSrc:   PainSc: (P) 0-No pain                 Butler Levander Pinal

## 2024-01-25 NOTE — Anesthesia Preprocedure Evaluation (Addendum)
 Anesthesia Evaluation  Patient identified by MRN, date of birth, ID band Patient awake    Reviewed: Allergy & Precautions, NPO status , Patient's Chart, lab work & pertinent test results  History of Anesthesia Complications Negative for: history of anesthetic complications  Airway Mallampati: II  TM Distance: >3 FB Neck ROM: Full    Dental  (+) Dental Advisory Given, Missing, Poor Dentition   Pulmonary sleep apnea (does not use CPAP) , former smoker   breath sounds clear to auscultation       Cardiovascular hypertension, (-) angina + CAD, + Cardiac Stents and + Peripheral Vascular Disease  + Valvular Problems/Murmurs (mild MS)  Rhythm:Regular Rate:Normal  '23 ECHO: EF  >75%. 1. The LV has hyperdynamic function, no regional wall motion abnormalities. There is moderate concentric left ventricular hypertrophy. Grade I diastolic dysfunction  (impaired relaxation). Elevated left atrial pressure.   2. RVF is normal. The right ventricular size is normal.   3. Left atrial size was mildly dilated.   4. The mitral valve is degenerative. No evidence of mitral valve regurgitation. Mild mitral stenosis. The mean mitral valve gradient is 7.0 mmHg with average heart rate of 118 bpm. Severe mitral annular calcification.   5. The aortic valve is tricuspid. There is mild calcification of the aortic valve. There is mild thickening of the aortic valve. Aortic valve regurgitation is not visualized. Aortic valve sclerosis is present, with no evidence of aortic valve stenosis.     Neuro/Psych Dizziness with dialysis yesterday    GI/Hepatic Neg liver ROS, hiatal hernia,GERD  Medicated and Controlled,,  Endo/Other  diabetes    Renal/GU Dialysis and ESRFRenal diseaseK+ 3.9, dialyzed yesterday     Musculoskeletal  (+) Arthritis ,    Abdominal   Peds  Hematology Hb 7.0, plt 143k plavix    Anesthesia Other Findings   Reproductive/Obstetrics                               Anesthesia Physical Anesthesia Plan  ASA: 4  Anesthesia Plan: MAC   Post-op Pain Management: Minimal or no pain anticipated   Induction:   PONV Risk Score and Plan: 1 and Treatment may vary due to age or medical condition  Airway Management Planned: Natural Airway and Simple Face Mask  Additional Equipment: None  Intra-op Plan:   Post-operative Plan:   Informed Consent: I have reviewed the patients History and Physical, chart, labs and discussed the procedure including the risks, benefits and alternatives for the proposed anesthesia with the patient or authorized representative who has indicated his/her understanding and acceptance.     Dental advisory given  Plan Discussed with: CRNA and Surgeon  Anesthesia Plan Comments:          Anesthesia Quick Evaluation

## 2024-01-28 ENCOUNTER — Encounter (HOSPITAL_COMMUNITY): Payer: Self-pay | Admitting: Gastroenterology

## 2024-01-28 LAB — SURGICAL PATHOLOGY

## 2024-01-29 ENCOUNTER — Telehealth: Payer: Self-pay

## 2024-01-29 NOTE — Telephone Encounter (Signed)
 Attempted to call for surgery scheduling. LVM

## 2024-01-29 NOTE — Telephone Encounter (Signed)
 Spoke to patient regarding the scheduling of R upper arm fistulagram.  Patient is hesitant to schedule due to having a new allergic reaction to contrast dye.  He stated that the last time the dye stopped my heart.   This nurse advised that medication would be used to counteract allergic reaction.  Patient was still hesitant and stated that he would call our office when he needed us .  Paper work sent to scanning.

## 2024-04-21 ENCOUNTER — Ambulatory Visit: Admitting: Podiatry

## 2024-04-21 ENCOUNTER — Ambulatory Visit (INDEPENDENT_AMBULATORY_CARE_PROVIDER_SITE_OTHER): Admitting: Podiatry

## 2024-04-21 DIAGNOSIS — Z89431 Acquired absence of right foot: Secondary | ICD-10-CM

## 2024-04-21 DIAGNOSIS — M79675 Pain in left toe(s): Secondary | ICD-10-CM | POA: Diagnosis not present

## 2024-04-21 DIAGNOSIS — B351 Tinea unguium: Secondary | ICD-10-CM

## 2024-04-21 NOTE — Progress Notes (Signed)
 "  Chief Complaint  Patient presents with   Nail Problem    Pt is here to have his nails and callus trimmed down     SUBJECTIVE Patient with a history of diabetes mellitus presents to office today complaining of elongated, thickened nails that cause pain while ambulating in shoes.  Patient is unable to trim their own nails. Patient is here for further evaluation and treatment.  Past Medical History:  Diagnosis Date   Anemia of chronic disease    Arthritis    CAD (coronary artery disease)    Carotid stenosis    a. <50% RICA, >70% LICA by duplex 07/2015   Chronic chest pain    occ   Constipation    chronic   Dyspnea    occasional with extertion   ESRD (end stage renal disease) on dialysis (HCC)    M-W-F- [facility]   GERD (gastroesophageal reflux disease)    History of hiatal hernia    History of kidney stones    Passed   HNP (herniated nucleus pulposus), lumbar    HTN (hypertension)    echo 3/10: EF 60%, LAE   Hyperlipidemia    Mitral stenosis    mild MS 11/15/21   Nephrolithiasis    passed them all   PEA (Pulseless electrical activity) (HCC) 11/15/2021   PEA arrest felt likely related to contrast anaphylaxis despite premedication   Peripheral arterial disease    a. s/p PTCA  right    Pneumonia yrs ago   Restless legs    Sciatic leg pain    Sleep apnea    does not use cpap   Snores    a. presumed OSA, pt has refused sleep eval in past.   Type II diabetes mellitus (HCC)    no longer on medications, checks blood glucose at home   Urinary frequency    Uses wheelchair    Walker as ambulation aid     Allergies[1]   OBJECTIVE General Patient is awake, alert, and oriented x 3 and in no acute distress. Derm Skin is dry and supple left. Callus tissue noted. No open wounds. Nails are tender, long, thickened and dystrophic with subungual debris, consistent with onychomycosis, 1-5 left. No signs of infection noted. Vasc known history of peripheral vascular disease Neuro  light touch and protective threshold diminished musculoskeletal Exam history of transmetatarsal amputation right  ASSESSMENT 1. Diabetes Mellitus w/ peripheral neuropathy 2.  Pain due to onychomycosis of toenails left 3. H/o TMA LT  PLAN OF CARE 1. Patient evaluated today. 2. Instructed to maintain good pedal hygiene and foot care. Stressed importance of controlling blood sugar.  3. Mechanical debridement of nails 1-5 left performed using a nail nipper. Filed with dremel without incident.  4. Return to clinic in 3 mos.     Thresa EMERSON Sar, DPM [facility]  Dr. Thresa EMERSON Sar, DPM    [address] [place], [place] [address]                Office [phone]  Fax [phone]            [1]  Allergies Allergen Reactions   Contrast Media [Iodinated Contrast Media] Anaphylaxis, Hives, Shortness Of Breath, Swelling and Other (See Comments)  Cardiac arrest Throat swelling   Kiwi Extract Itching and Swelling    Lip, facial swelling - does not affect breathing   Flexeril  [Cyclobenzaprine ] Other (See Comments)    Weakness in hands, unable to hold items   Tape Rash    Blistering rash  Reaction occurs with plastic tapes   "

## 2024-07-22 ENCOUNTER — Ambulatory Visit: Admitting: Podiatry
